# Patient Record
Sex: Female | Born: 1950
Health system: Southern US, Community
[De-identification: ages and names within clinical notes are randomized; demographics above are authoritative.]

## PROBLEM LIST (undated history)

## (undated) ENCOUNTER — Emergency Department (HOSPITAL_COMMUNITY): Admission: EM | Payer: Self-pay | Source: Home / Self Care

## (undated) ENCOUNTER — Ambulatory Visit (HOSPITAL_COMMUNITY): Discharge status: Absent without leave | Payer: Medicare Other

## (undated) DIAGNOSIS — E662 Morbid (severe) obesity with alveolar hypoventilation: Secondary | ICD-10-CM

## (undated) DIAGNOSIS — E7800 Pure hypercholesterolemia, unspecified: Secondary | ICD-10-CM

## (undated) DIAGNOSIS — M419 Scoliosis, unspecified: Secondary | ICD-10-CM

## (undated) DIAGNOSIS — I509 Heart failure, unspecified: Secondary | ICD-10-CM

## (undated) DIAGNOSIS — G4730 Sleep apnea, unspecified: Secondary | ICD-10-CM

## (undated) DIAGNOSIS — M549 Dorsalgia, unspecified: Secondary | ICD-10-CM

## (undated) DIAGNOSIS — G459 Transient cerebral ischemic attack, unspecified: Secondary | ICD-10-CM

## (undated) DIAGNOSIS — D649 Anemia, unspecified: Secondary | ICD-10-CM

## (undated) DIAGNOSIS — J449 Chronic obstructive pulmonary disease, unspecified: Secondary | ICD-10-CM

## (undated) DIAGNOSIS — I219 Acute myocardial infarction, unspecified: Secondary | ICD-10-CM

## (undated) DIAGNOSIS — I10 Essential (primary) hypertension: Secondary | ICD-10-CM

## (undated) DIAGNOSIS — I639 Cerebral infarction, unspecified: Secondary | ICD-10-CM

## (undated) DIAGNOSIS — I2510 Atherosclerotic heart disease of native coronary artery without angina pectoris: Secondary | ICD-10-CM

## (undated) DIAGNOSIS — G8929 Other chronic pain: Secondary | ICD-10-CM

## (undated) DIAGNOSIS — E669 Obesity, unspecified: Secondary | ICD-10-CM

## (undated) DIAGNOSIS — M1990 Unspecified osteoarthritis, unspecified site: Secondary | ICD-10-CM

## (undated) DIAGNOSIS — N289 Disorder of kidney and ureter, unspecified: Secondary | ICD-10-CM

## (undated) HISTORY — PX: CORONARY ANGIOPLASTY WITH STENT PLACEMENT: SHX49

## (undated) HISTORY — PX: COLON SURGERY: SHX602

## (undated) HISTORY — PX: OTHER SURGICAL HISTORY: SHX169

---

## 1997-06-04 ENCOUNTER — Inpatient Hospital Stay (HOSPITAL_COMMUNITY): Admission: EM | Admit: 1997-06-04 | Discharge: 1997-06-08 | Payer: Self-pay | Admitting: Emergency Medicine

## 1997-07-15 ENCOUNTER — Emergency Department (HOSPITAL_COMMUNITY): Admission: EM | Admit: 1997-07-15 | Discharge: 1997-07-15 | Payer: Self-pay | Admitting: Emergency Medicine

## 1997-07-17 ENCOUNTER — Other Ambulatory Visit: Admission: RE | Admit: 1997-07-17 | Discharge: 1997-07-17 | Payer: Self-pay | Admitting: Hematology and Oncology

## 1997-07-20 ENCOUNTER — Ambulatory Visit (HOSPITAL_COMMUNITY): Admission: RE | Admit: 1997-07-20 | Discharge: 1997-07-20 | Payer: Self-pay | Admitting: Hematology and Oncology

## 1997-07-26 ENCOUNTER — Emergency Department (HOSPITAL_COMMUNITY): Admission: EM | Admit: 1997-07-26 | Discharge: 1997-07-26 | Payer: Self-pay | Admitting: Emergency Medicine

## 1997-07-26 ENCOUNTER — Ambulatory Visit (HOSPITAL_COMMUNITY): Admission: RE | Admit: 1997-07-26 | Discharge: 1997-07-26 | Payer: Self-pay | Admitting: General Surgery

## 1997-07-31 ENCOUNTER — Inpatient Hospital Stay (HOSPITAL_COMMUNITY): Admission: EM | Admit: 1997-07-31 | Discharge: 1997-08-11 | Payer: Self-pay | Admitting: Emergency Medicine

## 1999-06-13 ENCOUNTER — Ambulatory Visit (HOSPITAL_COMMUNITY): Admission: RE | Admit: 1999-06-13 | Discharge: 1999-06-13 | Payer: Self-pay | Admitting: Oncology

## 1999-06-13 ENCOUNTER — Encounter: Payer: Self-pay | Admitting: Oncology

## 1999-07-17 ENCOUNTER — Encounter: Admission: RE | Admit: 1999-07-17 | Discharge: 1999-07-17 | Payer: Self-pay | Admitting: Oncology

## 1999-07-17 ENCOUNTER — Encounter: Payer: Self-pay | Admitting: Oncology

## 1999-08-07 ENCOUNTER — Ambulatory Visit (HOSPITAL_COMMUNITY): Admission: RE | Admit: 1999-08-07 | Discharge: 1999-08-07 | Payer: Self-pay | Admitting: Gastroenterology

## 1999-09-05 ENCOUNTER — Encounter: Payer: Self-pay | Admitting: Emergency Medicine

## 1999-09-05 ENCOUNTER — Emergency Department (HOSPITAL_COMMUNITY): Admission: EM | Admit: 1999-09-05 | Discharge: 1999-09-05 | Payer: Self-pay | Admitting: Emergency Medicine

## 1999-12-09 ENCOUNTER — Encounter: Payer: Self-pay | Admitting: Oncology

## 1999-12-09 ENCOUNTER — Ambulatory Visit (HOSPITAL_COMMUNITY): Admission: RE | Admit: 1999-12-09 | Discharge: 1999-12-09 | Payer: Self-pay | Admitting: Oncology

## 2000-02-01 ENCOUNTER — Encounter: Payer: Self-pay | Admitting: Emergency Medicine

## 2000-02-01 ENCOUNTER — Emergency Department (HOSPITAL_COMMUNITY): Admission: EM | Admit: 2000-02-01 | Discharge: 2000-02-01 | Payer: Self-pay | Admitting: Emergency Medicine

## 2000-05-27 ENCOUNTER — Encounter: Payer: Self-pay | Admitting: Emergency Medicine

## 2000-05-28 ENCOUNTER — Encounter: Payer: Self-pay | Admitting: Cardiology

## 2000-05-28 ENCOUNTER — Observation Stay (HOSPITAL_COMMUNITY): Admission: EM | Admit: 2000-05-28 | Discharge: 2000-05-28 | Payer: Self-pay | Admitting: Emergency Medicine

## 2000-08-04 ENCOUNTER — Emergency Department (HOSPITAL_COMMUNITY): Admission: EM | Admit: 2000-08-04 | Discharge: 2000-08-04 | Payer: Self-pay | Admitting: Emergency Medicine

## 2000-08-04 ENCOUNTER — Encounter: Payer: Self-pay | Admitting: Internal Medicine

## 2000-08-08 ENCOUNTER — Ambulatory Visit (HOSPITAL_COMMUNITY): Admission: RE | Admit: 2000-08-08 | Discharge: 2000-08-08 | Payer: Self-pay | Admitting: *Deleted

## 2000-09-04 ENCOUNTER — Emergency Department (HOSPITAL_COMMUNITY): Admission: EM | Admit: 2000-09-04 | Discharge: 2000-09-04 | Payer: Self-pay | Admitting: *Deleted

## 2000-12-17 ENCOUNTER — Emergency Department (HOSPITAL_COMMUNITY): Admission: EM | Admit: 2000-12-17 | Discharge: 2000-12-17 | Payer: Self-pay | Admitting: *Deleted

## 2001-02-01 ENCOUNTER — Emergency Department (HOSPITAL_COMMUNITY): Admission: EM | Admit: 2001-02-01 | Discharge: 2001-02-01 | Payer: Self-pay | Admitting: Emergency Medicine

## 2001-09-13 ENCOUNTER — Emergency Department (HOSPITAL_COMMUNITY): Admission: EM | Admit: 2001-09-13 | Discharge: 2001-09-13 | Payer: Self-pay | Admitting: Emergency Medicine

## 2001-09-13 ENCOUNTER — Encounter: Payer: Self-pay | Admitting: Emergency Medicine

## 2002-02-14 ENCOUNTER — Emergency Department (HOSPITAL_COMMUNITY): Admission: EM | Admit: 2002-02-14 | Discharge: 2002-02-14 | Payer: Self-pay | Admitting: Emergency Medicine

## 2002-02-15 ENCOUNTER — Encounter: Payer: Self-pay | Admitting: Cardiology

## 2002-02-15 ENCOUNTER — Inpatient Hospital Stay (HOSPITAL_COMMUNITY): Admission: AD | Admit: 2002-02-15 | Discharge: 2002-02-17 | Payer: Self-pay | Admitting: Cardiology

## 2002-02-15 ENCOUNTER — Encounter: Payer: Self-pay | Admitting: Emergency Medicine

## 2002-02-17 ENCOUNTER — Encounter: Payer: Self-pay | Admitting: Cardiology

## 2002-02-27 ENCOUNTER — Encounter: Payer: Self-pay | Admitting: Cardiology

## 2002-02-27 ENCOUNTER — Ambulatory Visit (HOSPITAL_COMMUNITY): Admission: RE | Admit: 2002-02-27 | Discharge: 2002-02-27 | Payer: Self-pay | Admitting: Cardiology

## 2002-10-24 ENCOUNTER — Ambulatory Visit (HOSPITAL_COMMUNITY): Admission: RE | Admit: 2002-10-24 | Discharge: 2002-10-24 | Payer: Self-pay | Admitting: Pulmonary Disease

## 2002-10-24 ENCOUNTER — Encounter: Payer: Self-pay | Admitting: Pulmonary Disease

## 2002-11-25 ENCOUNTER — Emergency Department (HOSPITAL_COMMUNITY): Admission: EM | Admit: 2002-11-25 | Discharge: 2002-11-25 | Payer: Self-pay | Admitting: Emergency Medicine

## 2003-06-20 ENCOUNTER — Emergency Department (HOSPITAL_COMMUNITY): Admission: EM | Admit: 2003-06-20 | Discharge: 2003-06-20 | Payer: Self-pay | Admitting: Emergency Medicine

## 2003-08-11 ENCOUNTER — Emergency Department (HOSPITAL_COMMUNITY): Admission: EM | Admit: 2003-08-11 | Discharge: 2003-08-12 | Payer: Self-pay | Admitting: *Deleted

## 2003-12-22 ENCOUNTER — Emergency Department (HOSPITAL_COMMUNITY): Admission: EM | Admit: 2003-12-22 | Discharge: 2003-12-22 | Payer: Self-pay | Admitting: Emergency Medicine

## 2004-04-20 ENCOUNTER — Emergency Department (HOSPITAL_COMMUNITY): Admission: EM | Admit: 2004-04-20 | Discharge: 2004-04-20 | Payer: Self-pay | Admitting: Family Medicine

## 2004-06-12 ENCOUNTER — Emergency Department (HOSPITAL_COMMUNITY): Admission: EM | Admit: 2004-06-12 | Discharge: 2004-06-12 | Payer: Self-pay | Admitting: Family Medicine

## 2004-06-28 ENCOUNTER — Emergency Department (HOSPITAL_COMMUNITY): Admission: EM | Admit: 2004-06-28 | Discharge: 2004-06-28 | Payer: Self-pay | Admitting: Emergency Medicine

## 2004-07-03 ENCOUNTER — Emergency Department (HOSPITAL_COMMUNITY): Admission: EM | Admit: 2004-07-03 | Discharge: 2004-07-03 | Payer: Self-pay | Admitting: Emergency Medicine

## 2004-07-11 ENCOUNTER — Encounter: Admission: RE | Admit: 2004-07-11 | Discharge: 2004-07-11 | Payer: Self-pay | Admitting: Orthopedic Surgery

## 2004-08-07 ENCOUNTER — Emergency Department (HOSPITAL_COMMUNITY): Admission: EM | Admit: 2004-08-07 | Discharge: 2004-08-07 | Payer: Self-pay | Admitting: Emergency Medicine

## 2004-08-29 ENCOUNTER — Ambulatory Visit: Payer: Self-pay | Admitting: Oncology

## 2004-09-06 ENCOUNTER — Emergency Department (HOSPITAL_COMMUNITY): Admission: EM | Admit: 2004-09-06 | Discharge: 2004-09-07 | Payer: Self-pay | Admitting: Emergency Medicine

## 2004-09-12 ENCOUNTER — Emergency Department (HOSPITAL_COMMUNITY): Admission: EM | Admit: 2004-09-12 | Discharge: 2004-09-12 | Payer: Self-pay | Admitting: Family Medicine

## 2004-11-16 ENCOUNTER — Emergency Department (HOSPITAL_COMMUNITY): Admission: EM | Admit: 2004-11-16 | Discharge: 2004-11-16 | Payer: Self-pay | Admitting: Family Medicine

## 2004-12-27 ENCOUNTER — Emergency Department (HOSPITAL_COMMUNITY): Admission: EM | Admit: 2004-12-27 | Discharge: 2004-12-27 | Payer: Self-pay | Admitting: Family Medicine

## 2005-02-08 ENCOUNTER — Emergency Department (HOSPITAL_COMMUNITY): Admission: EM | Admit: 2005-02-08 | Discharge: 2005-02-08 | Payer: Self-pay | Admitting: Emergency Medicine

## 2005-02-13 ENCOUNTER — Ambulatory Visit: Payer: Self-pay | Admitting: Oncology

## 2005-02-19 ENCOUNTER — Encounter: Admission: RE | Admit: 2005-02-19 | Discharge: 2005-02-19 | Payer: Self-pay | Admitting: Pulmonary Disease

## 2005-03-14 ENCOUNTER — Emergency Department (HOSPITAL_COMMUNITY): Admission: EM | Admit: 2005-03-14 | Discharge: 2005-03-14 | Payer: Self-pay | Admitting: Emergency Medicine

## 2005-04-03 ENCOUNTER — Emergency Department (HOSPITAL_COMMUNITY): Admission: EM | Admit: 2005-04-03 | Discharge: 2005-04-03 | Payer: Self-pay | Admitting: Emergency Medicine

## 2005-04-16 ENCOUNTER — Ambulatory Visit: Payer: Self-pay | Admitting: Oncology

## 2005-05-25 ENCOUNTER — Emergency Department (HOSPITAL_COMMUNITY): Admission: EM | Admit: 2005-05-25 | Discharge: 2005-05-26 | Payer: Self-pay | Admitting: Emergency Medicine

## 2005-06-05 ENCOUNTER — Emergency Department (HOSPITAL_COMMUNITY): Admission: EM | Admit: 2005-06-05 | Discharge: 2005-06-05 | Payer: Self-pay | Admitting: Emergency Medicine

## 2005-07-09 ENCOUNTER — Ambulatory Visit: Payer: Self-pay | Admitting: Oncology

## 2005-07-13 LAB — CBC WITH DIFFERENTIAL/PLATELET
BASO%: 0.3 % (ref 0.0–2.0)
Basophils Absolute: 0 10*3/uL (ref 0.0–0.1)
EOS%: 2.5 % (ref 0.0–7.0)
Eosinophils Absolute: 0.3 10*3/uL (ref 0.0–0.5)
HCT: 34.8 % (ref 34.8–46.6)
HGB: 11.9 g/dL (ref 11.6–15.9)
LYMPH%: 61.2 % — ABNORMAL HIGH (ref 14.0–48.0)
MCH: 31.2 pg (ref 26.0–34.0)
MCHC: 34.2 g/dL (ref 32.0–36.0)
MCV: 91 fL (ref 81.0–101.0)
MONO#: 0.7 10*3/uL (ref 0.1–0.9)
MONO%: 5.6 % (ref 0.0–13.0)
NEUT#: 3.6 10*3/uL (ref 1.5–6.5)
NEUT%: 30.4 % — ABNORMAL LOW (ref 39.6–76.8)
Platelets: 223 10*3/uL (ref 145–400)
RBC: 3.83 10*6/uL (ref 3.70–5.32)
RDW: 14.9 % — ABNORMAL HIGH (ref 11.3–14.5)
WBC: 11.8 10*3/uL — ABNORMAL HIGH (ref 3.9–10.0)
lymph#: 7.2 10*3/uL — ABNORMAL HIGH (ref 0.9–3.3)

## 2005-07-13 LAB — COMPREHENSIVE METABOLIC PANEL
ALT: 13 U/L (ref 0–40)
AST: 16 U/L (ref 0–37)
Albumin: 4.5 g/dL (ref 3.5–5.2)
Alkaline Phosphatase: 85 U/L (ref 39–117)
BUN: 17 mg/dL (ref 6–23)
CO2: 22 mEq/L (ref 19–32)
Calcium: 9.9 mg/dL (ref 8.4–10.5)
Chloride: 107 mEq/L (ref 96–112)
Creatinine, Ser: 1.14 mg/dL (ref 0.40–1.20)
Glucose, Bld: 149 mg/dL — ABNORMAL HIGH (ref 70–99)
Potassium: 3.7 mEq/L (ref 3.5–5.3)
Sodium: 143 mEq/L (ref 135–145)
Total Bilirubin: 0.5 mg/dL (ref 0.3–1.2)
Total Protein: 7.6 g/dL (ref 6.0–8.3)

## 2005-07-13 LAB — LACTATE DEHYDROGENASE: LDH: 248 U/L (ref 94–250)

## 2005-07-13 LAB — CEA: CEA: 1.5 ng/mL (ref 0.0–5.0)

## 2005-08-16 ENCOUNTER — Emergency Department (HOSPITAL_COMMUNITY): Admission: EM | Admit: 2005-08-16 | Discharge: 2005-08-16 | Payer: Self-pay | Admitting: Family Medicine

## 2005-09-12 ENCOUNTER — Emergency Department (HOSPITAL_COMMUNITY): Admission: EM | Admit: 2005-09-12 | Discharge: 2005-09-12 | Payer: Self-pay | Admitting: Family Medicine

## 2005-11-06 ENCOUNTER — Emergency Department (HOSPITAL_COMMUNITY): Admission: EM | Admit: 2005-11-06 | Discharge: 2005-11-06 | Payer: Self-pay | Admitting: Family Medicine

## 2006-01-21 ENCOUNTER — Encounter: Admission: RE | Admit: 2006-01-21 | Discharge: 2006-04-01 | Payer: Self-pay | Admitting: Pulmonary Disease

## 2006-03-30 ENCOUNTER — Encounter: Admission: RE | Admit: 2006-03-30 | Discharge: 2006-03-30 | Payer: Self-pay | Admitting: Pulmonary Disease

## 2006-07-30 ENCOUNTER — Emergency Department (HOSPITAL_COMMUNITY): Admission: EM | Admit: 2006-07-30 | Discharge: 2006-07-31 | Payer: Self-pay | Admitting: Emergency Medicine

## 2006-09-20 ENCOUNTER — Encounter: Admission: RE | Admit: 2006-09-20 | Discharge: 2006-09-20 | Payer: Self-pay | Admitting: Cardiology

## 2006-10-14 ENCOUNTER — Emergency Department (HOSPITAL_COMMUNITY): Admission: EM | Admit: 2006-10-14 | Discharge: 2006-10-14 | Payer: Self-pay | Admitting: Emergency Medicine

## 2007-01-30 ENCOUNTER — Emergency Department (HOSPITAL_COMMUNITY): Admission: EM | Admit: 2007-01-30 | Discharge: 2007-01-30 | Payer: Self-pay | Admitting: *Deleted

## 2007-06-24 ENCOUNTER — Emergency Department (HOSPITAL_COMMUNITY): Admission: EM | Admit: 2007-06-24 | Discharge: 2007-06-25 | Payer: Self-pay | Admitting: Emergency Medicine

## 2007-08-03 ENCOUNTER — Emergency Department (HOSPITAL_COMMUNITY): Admission: EM | Admit: 2007-08-03 | Discharge: 2007-08-03 | Payer: Self-pay | Admitting: Emergency Medicine

## 2007-08-12 ENCOUNTER — Emergency Department (HOSPITAL_COMMUNITY): Admission: EM | Admit: 2007-08-12 | Discharge: 2007-08-12 | Payer: Self-pay | Admitting: Emergency Medicine

## 2007-09-28 ENCOUNTER — Inpatient Hospital Stay (HOSPITAL_COMMUNITY): Admission: EM | Admit: 2007-09-28 | Discharge: 2007-09-30 | Payer: Self-pay | Admitting: Emergency Medicine

## 2008-03-12 ENCOUNTER — Encounter: Admission: RE | Admit: 2008-03-12 | Discharge: 2008-06-10 | Payer: Self-pay | Admitting: Orthopedic Surgery

## 2008-06-20 ENCOUNTER — Emergency Department (HOSPITAL_COMMUNITY): Admission: EM | Admit: 2008-06-20 | Discharge: 2008-06-20 | Payer: Self-pay | Admitting: Emergency Medicine

## 2008-07-10 ENCOUNTER — Emergency Department (HOSPITAL_COMMUNITY): Admission: EM | Admit: 2008-07-10 | Discharge: 2008-07-10 | Payer: Self-pay | Admitting: Emergency Medicine

## 2008-08-23 ENCOUNTER — Emergency Department (HOSPITAL_COMMUNITY): Admission: EM | Admit: 2008-08-23 | Discharge: 2008-08-23 | Payer: Self-pay | Admitting: Emergency Medicine

## 2008-08-30 ENCOUNTER — Emergency Department (HOSPITAL_COMMUNITY): Admission: EM | Admit: 2008-08-30 | Discharge: 2008-08-30 | Payer: Self-pay | Admitting: Emergency Medicine

## 2008-11-06 ENCOUNTER — Emergency Department (HOSPITAL_COMMUNITY): Admission: EM | Admit: 2008-11-06 | Discharge: 2008-11-06 | Payer: Self-pay | Admitting: Emergency Medicine

## 2008-12-12 ENCOUNTER — Inpatient Hospital Stay (HOSPITAL_COMMUNITY): Admission: AD | Admit: 2008-12-12 | Discharge: 2008-12-14 | Payer: Self-pay | Admitting: Cardiology

## 2008-12-12 ENCOUNTER — Encounter: Payer: Self-pay | Admitting: Emergency Medicine

## 2009-05-20 ENCOUNTER — Emergency Department (HOSPITAL_COMMUNITY): Admission: EM | Admit: 2009-05-20 | Discharge: 2009-05-20 | Payer: Self-pay | Admitting: Emergency Medicine

## 2009-07-20 ENCOUNTER — Emergency Department (HOSPITAL_COMMUNITY): Admission: EM | Admit: 2009-07-20 | Discharge: 2009-07-21 | Payer: Self-pay | Admitting: Emergency Medicine

## 2009-07-23 ENCOUNTER — Emergency Department (HOSPITAL_COMMUNITY): Admission: EM | Admit: 2009-07-23 | Discharge: 2009-07-23 | Payer: Self-pay | Admitting: Family Medicine

## 2009-08-14 ENCOUNTER — Emergency Department (HOSPITAL_COMMUNITY): Admission: EM | Admit: 2009-08-14 | Discharge: 2009-08-14 | Payer: Self-pay | Admitting: Emergency Medicine

## 2009-10-13 ENCOUNTER — Inpatient Hospital Stay (HOSPITAL_COMMUNITY): Admission: EM | Admit: 2009-10-13 | Discharge: 2009-10-15 | Payer: Self-pay | Admitting: Emergency Medicine

## 2009-10-14 ENCOUNTER — Encounter (INDEPENDENT_AMBULATORY_CARE_PROVIDER_SITE_OTHER): Payer: Self-pay | Admitting: Family Medicine

## 2010-01-29 ENCOUNTER — Emergency Department (HOSPITAL_COMMUNITY)
Admission: EM | Admit: 2010-01-29 | Discharge: 2010-01-29 | Payer: Self-pay | Source: Home / Self Care | Admitting: Family Medicine

## 2010-02-15 ENCOUNTER — Encounter: Payer: Self-pay | Admitting: Cardiology

## 2010-02-16 ENCOUNTER — Encounter: Payer: Self-pay | Admitting: Oncology

## 2010-02-16 ENCOUNTER — Encounter: Payer: Self-pay | Admitting: Pulmonary Disease

## 2010-02-16 ENCOUNTER — Encounter: Payer: Self-pay | Admitting: Cardiology

## 2010-02-16 ENCOUNTER — Encounter: Payer: Self-pay | Admitting: Neurosurgery

## 2010-04-10 LAB — GLUCOSE, CAPILLARY
Glucose-Capillary: 212 mg/dL — ABNORMAL HIGH (ref 70–99)
Glucose-Capillary: 227 mg/dL — ABNORMAL HIGH (ref 70–99)
Glucose-Capillary: 238 mg/dL — ABNORMAL HIGH (ref 70–99)
Glucose-Capillary: 281 mg/dL — ABNORMAL HIGH (ref 70–99)
Glucose-Capillary: 291 mg/dL — ABNORMAL HIGH (ref 70–99)
Glucose-Capillary: 292 mg/dL — ABNORMAL HIGH (ref 70–99)
Glucose-Capillary: 311 mg/dL — ABNORMAL HIGH (ref 70–99)
Glucose-Capillary: 333 mg/dL — ABNORMAL HIGH (ref 70–99)
Glucose-Capillary: 352 mg/dL — ABNORMAL HIGH (ref 70–99)
Glucose-Capillary: 352 mg/dL — ABNORMAL HIGH (ref 70–99)
Glucose-Capillary: 366 mg/dL — ABNORMAL HIGH (ref 70–99)

## 2010-04-10 LAB — URINALYSIS, ROUTINE W REFLEX MICROSCOPIC
Bilirubin Urine: NEGATIVE
Glucose, UA: >1000 mg/dL — AB
Hgb urine dipstick: NEGATIVE
Ketones, ur: NEGATIVE mg/dL
Leukocytes, UA: NEGATIVE
Nitrite: NEGATIVE
Protein, ur: 30 mg/dL — AB
Specific Gravity, Urine: 1.037 — ABNORMAL HIGH (ref 1.005–1.030)
Urobilinogen, UA: 0.2 mg/dL (ref 0.0–1.0)
pH: 5.5 (ref 5.0–8.0)

## 2010-04-10 LAB — DIFFERENTIAL
Basophils Absolute: 0 10*3/uL (ref 0.0–0.1)
Basophils Relative: 0 % (ref 0–1)
Eosinophils Absolute: 0.2 10*3/uL (ref 0.0–0.7)
Eosinophils Relative: 3 % (ref 0–5)
Lymphocytes Relative: 37 % (ref 12–46)
Lymphs Abs: 2.7 10*3/uL (ref 0.7–4.0)
Monocytes Absolute: 0.4 10*3/uL (ref 0.1–1.0)
Monocytes Relative: 6 % (ref 3–12)
Neutro Abs: 3.9 10*3/uL (ref 1.7–7.7)
Neutrophils Relative %: 54 % (ref 43–77)

## 2010-04-10 LAB — CK TOTAL AND CKMB (NOT AT ARMC)
CK, MB: 4.5 ng/mL — ABNORMAL HIGH (ref 0.3–4.0)
Relative Index: 3.1 — ABNORMAL HIGH (ref 0.0–2.5)
Total CK: 146 U/L (ref 7–177)

## 2010-04-10 LAB — CBC
HCT: 34.8 % — ABNORMAL LOW (ref 36.0–46.0)
Hemoglobin: 12.4 g/dL (ref 12.0–15.0)
MCH: 32.1 pg (ref 26.0–34.0)
MCHC: 35.6 g/dL (ref 30.0–36.0)
MCV: 90.2 fL (ref 78.0–100.0)
Platelets: 164 10*3/uL (ref 150–400)
RBC: 3.86 MIL/uL — ABNORMAL LOW (ref 3.87–5.11)
RDW: 13 % (ref 11.5–15.5)
WBC: 7.2 10*3/uL (ref 4.0–10.5)

## 2010-04-10 LAB — COMPREHENSIVE METABOLIC PANEL
ALT: 20 U/L (ref 0–35)
AST: 27 U/L (ref 0–37)
Albumin: 3.6 g/dL (ref 3.5–5.2)
Alkaline Phosphatase: 94 U/L (ref 39–117)
BUN: 13 mg/dL (ref 6–23)
CO2: 24 mEq/L (ref 19–32)
Calcium: 9.2 mg/dL (ref 8.4–10.5)
Chloride: 107 mEq/L (ref 96–112)
Creatinine, Ser: 0.98 mg/dL (ref 0.4–1.2)
GFR calc Af Amer: >60 mL/min (ref >60)
GFR calc non Af Amer: 58 mL/min — ABNORMAL LOW (ref >60)
Glucose, Bld: 386 mg/dL — ABNORMAL HIGH (ref 70–99)
Potassium: 3.5 mEq/L (ref 3.5–5.1)
Sodium: 139 mEq/L (ref 135–145)
Total Bilirubin: 0.4 mg/dL (ref 0.3–1.2)
Total Protein: 6.8 g/dL (ref 6.0–8.3)

## 2010-04-10 LAB — HEMOGLOBIN A1C
Hgb A1c MFr Bld: 12 % — ABNORMAL HIGH (ref <5.7)
Mean Plasma Glucose: 298 mg/dL — ABNORMAL HIGH (ref <117)

## 2010-04-10 LAB — LIPID PANEL
Cholesterol: 145 mg/dL (ref 0–200)
HDL: 33 mg/dL — ABNORMAL LOW (ref >39)
LDL Cholesterol: UNDETERMINED mg/dL (ref 0–99)
Total CHOL/HDL Ratio: 4.4 RATIO
Triglycerides: 503 mg/dL — ABNORMAL HIGH (ref <150)
VLDL: UNDETERMINED mg/dL (ref 0–40)

## 2010-04-10 LAB — URINE CULTURE
Colony Count: 10000
Culture  Setup Time: 201109182058

## 2010-04-10 LAB — CARDIAC PANEL(CRET KIN+CKTOT+MB+TROPI)
CK, MB: 3.3 ng/mL (ref 0.3–4.0)
Relative Index: 2.6 — ABNORMAL HIGH (ref 0.0–2.5)
Total CK: 127 U/L (ref 7–177)
Troponin I: 0.02 ng/mL (ref 0.00–0.06)

## 2010-04-10 LAB — POCT CARDIAC MARKERS
CKMB, poc: 2.5 ng/mL (ref 1.0–8.0)
Myoglobin, poc: 90.8 ng/mL (ref 12–200)
Troponin i, poc: <0.05 ng/mL (ref 0.00–0.09)

## 2010-04-10 LAB — BRAIN NATRIURETIC PEPTIDE: Pro B Natriuretic peptide (BNP): 44 pg/mL (ref 0.0–100.0)

## 2010-04-10 LAB — D-DIMER, QUANTITATIVE: D-Dimer, Quant: 0.46 ug/mL-FEU (ref 0.00–0.48)

## 2010-04-10 LAB — URINE MICROSCOPIC-ADD ON

## 2010-04-10 LAB — TROPONIN I: Troponin I: 0.01 ng/mL (ref 0.00–0.06)

## 2010-04-13 LAB — POCT CARDIAC MARKERS
CKMB, poc: 4.2 ng/mL (ref 1.0–8.0)
CKMB, poc: 6.1 ng/mL (ref 1.0–8.0)
Myoglobin, poc: 134 ng/mL (ref 12–200)
Myoglobin, poc: 152 ng/mL (ref 12–200)
Troponin i, poc: <0.05 ng/mL (ref 0.00–0.09)
Troponin i, poc: <0.05 ng/mL (ref 0.00–0.09)

## 2010-04-13 LAB — DIFFERENTIAL
Basophils Absolute: 0.1 10*3/uL (ref 0.0–0.1)
Basophils Relative: 1 % (ref 0–1)
Eosinophils Absolute: 0.2 10*3/uL (ref 0.0–0.7)
Eosinophils Relative: 3 % (ref 0–5)
Lymphocytes Relative: 40 % (ref 12–46)
Lymphs Abs: 3.4 10*3/uL (ref 0.7–4.0)
Monocytes Absolute: 0.6 10*3/uL (ref 0.1–1.0)
Monocytes Relative: 7 % (ref 3–12)
Neutro Abs: 4.3 10*3/uL (ref 1.7–7.7)
Neutrophils Relative %: 50 % (ref 43–77)

## 2010-04-13 LAB — COMPREHENSIVE METABOLIC PANEL
ALT: 21 U/L (ref 0–35)
AST: 23 U/L (ref 0–37)
Albumin: 3.6 g/dL (ref 3.5–5.2)
Alkaline Phosphatase: 87 U/L (ref 39–117)
BUN: 21 mg/dL (ref 6–23)
CO2: 22 mEq/L (ref 19–32)
Calcium: 9.3 mg/dL (ref 8.4–10.5)
Chloride: 109 mEq/L (ref 96–112)
Creatinine, Ser: 1.43 mg/dL — ABNORMAL HIGH (ref 0.4–1.2)
GFR calc Af Amer: 46 mL/min — ABNORMAL LOW (ref >60)
GFR calc non Af Amer: 38 mL/min — ABNORMAL LOW (ref >60)
Glucose, Bld: 196 mg/dL — ABNORMAL HIGH (ref 70–99)
Potassium: 3.7 mEq/L (ref 3.5–5.1)
Sodium: 141 mEq/L (ref 135–145)
Total Bilirubin: 0.4 mg/dL (ref 0.3–1.2)
Total Protein: 6.8 g/dL (ref 6.0–8.3)

## 2010-04-13 LAB — CBC
HCT: 34 % — ABNORMAL LOW (ref 36.0–46.0)
Hemoglobin: 11.8 g/dL — ABNORMAL LOW (ref 12.0–15.0)
MCH: 32.9 pg (ref 26.0–34.0)
MCHC: 34.6 g/dL (ref 30.0–36.0)
MCV: 94.9 fL (ref 78.0–100.0)
Platelets: 171 K/uL (ref 150–400)
RBC: 3.58 MIL/uL — ABNORMAL LOW (ref 3.87–5.11)
RDW: 15.5 % (ref 11.5–15.5)
WBC: 8.6 K/uL (ref 4.0–10.5)

## 2010-04-13 LAB — LIPASE, BLOOD: Lipase: 45 U/L (ref 11–59)

## 2010-04-15 LAB — DIFFERENTIAL
Basophils Absolute: 0.1 10*3/uL (ref 0.0–0.1)
Basophils Relative: 0 % (ref 0–1)
Eosinophils Absolute: 0.2 10*3/uL (ref 0.0–0.7)
Eosinophils Relative: 1 % (ref 0–5)
Lymphocytes Relative: 34 % (ref 12–46)
Lymphs Abs: 5 10*3/uL — ABNORMAL HIGH (ref 0.7–4.0)
Monocytes Absolute: 1.2 10*3/uL — ABNORMAL HIGH (ref 0.1–1.0)
Monocytes Relative: 8 % (ref 3–12)
Neutro Abs: 8.4 10*3/uL — ABNORMAL HIGH (ref 1.7–7.7)
Neutrophils Relative %: 57 % (ref 43–77)

## 2010-04-15 LAB — CBC
HCT: 34.5 % — ABNORMAL LOW (ref 36.0–46.0)
Hemoglobin: 11.4 g/dL — ABNORMAL LOW (ref 12.0–15.0)
MCHC: 33.2 g/dL (ref 30.0–36.0)
MCV: 96.3 fL (ref 78.0–100.0)
Platelets: 201 10*3/uL (ref 150–400)
RBC: 3.58 MIL/uL — ABNORMAL LOW (ref 3.87–5.11)
RDW: 14.5 % (ref 11.5–15.5)
WBC: 14.7 10*3/uL — ABNORMAL HIGH (ref 4.0–10.5)

## 2010-04-15 LAB — URINALYSIS, ROUTINE W REFLEX MICROSCOPIC
Bilirubin Urine: NEGATIVE
Glucose, UA: NEGATIVE mg/dL
Hgb urine dipstick: NEGATIVE
Ketones, ur: NEGATIVE mg/dL
Leukocytes, UA: NEGATIVE
Nitrite: NEGATIVE
Protein, ur: 30 mg/dL — AB
Specific Gravity, Urine: 1.021 (ref 1.005–1.030)
Urobilinogen, UA: 0.2 mg/dL (ref 0.0–1.0)
pH: 5.5 (ref 5.0–8.0)

## 2010-04-15 LAB — COMPREHENSIVE METABOLIC PANEL
ALT: 15 U/L (ref 0–35)
AST: 21 U/L (ref 0–37)
Albumin: 3.9 g/dL (ref 3.5–5.2)
Alkaline Phosphatase: 72 U/L (ref 39–117)
BUN: 12 mg/dL (ref 6–23)
CO2: 26 mEq/L (ref 19–32)
Calcium: 9.1 mg/dL (ref 8.4–10.5)
Chloride: 109 mEq/L (ref 96–112)
Creatinine, Ser: 1.14 mg/dL (ref 0.4–1.2)
GFR calc Af Amer: 59 mL/min — ABNORMAL LOW (ref >60)
GFR calc non Af Amer: 49 mL/min — ABNORMAL LOW (ref >60)
Glucose, Bld: 139 mg/dL — ABNORMAL HIGH (ref 70–99)
Potassium: 3.4 mEq/L — ABNORMAL LOW (ref 3.5–5.1)
Sodium: 143 mEq/L (ref 135–145)
Total Bilirubin: 0.5 mg/dL (ref 0.3–1.2)
Total Protein: 7.9 g/dL (ref 6.0–8.3)

## 2010-04-15 LAB — URINE MICROSCOPIC-ADD ON

## 2010-04-30 LAB — URINALYSIS, ROUTINE W REFLEX MICROSCOPIC
Bilirubin Urine: NEGATIVE
Glucose, UA: >1000 mg/dL — AB
Hgb urine dipstick: NEGATIVE
Ketones, ur: NEGATIVE mg/dL
Leukocytes, UA: NEGATIVE
Nitrite: NEGATIVE
Protein, ur: NEGATIVE mg/dL
Specific Gravity, Urine: 1.039 — ABNORMAL HIGH (ref 1.005–1.030)
Urobilinogen, UA: 0.2 mg/dL (ref 0.0–1.0)
pH: 5.5 (ref 5.0–8.0)

## 2010-04-30 LAB — BASIC METABOLIC PANEL
BUN: 27 mg/dL — ABNORMAL HIGH (ref 6–23)
BUN: 29 mg/dL — ABNORMAL HIGH (ref 6–23)
BUN: 31 mg/dL — ABNORMAL HIGH (ref 6–23)
BUN: 14 mg/dL (ref 6–23)
CO2: 21 mEq/L (ref 19–32)
CO2: 21 mEq/L (ref 19–32)
CO2: 24 mEq/L (ref 19–32)
CO2: 25 mEq/L (ref 19–32)
Calcium: 8.9 mg/dL (ref 8.4–10.5)
Calcium: 9.1 mg/dL (ref 8.4–10.5)
Calcium: 9.3 mg/dL (ref 8.4–10.5)
Calcium: 9.7 mg/dL (ref 8.4–10.5)
Chloride: 103 mEq/L (ref 96–112)
Chloride: 106 mEq/L (ref 96–112)
Chloride: 107 mEq/L (ref 96–112)
Chloride: 96 mEq/L (ref 96–112)
Creatinine, Ser: 1.66 mg/dL — ABNORMAL HIGH (ref 0.4–1.2)
Creatinine, Ser: 2.05 mg/dL — ABNORMAL HIGH (ref 0.4–1.2)
Creatinine, Ser: 2.33 mg/dL — ABNORMAL HIGH (ref 0.4–1.2)
Creatinine, Ser: 1.38 mg/dL — ABNORMAL HIGH (ref 0.4–1.2)
GFR calc Af Amer: 26 mL/min — ABNORMAL LOW (ref >60)
GFR calc Af Amer: 30 mL/min — ABNORMAL LOW (ref >60)
GFR calc Af Amer: 39 mL/min — ABNORMAL LOW (ref >60)
GFR calc Af Amer: 48 mL/min — ABNORMAL LOW (ref >60)
GFR calc non Af Amer: 22 mL/min — ABNORMAL LOW (ref >60)
GFR calc non Af Amer: 25 mL/min — ABNORMAL LOW (ref >60)
GFR calc non Af Amer: 32 mL/min — ABNORMAL LOW (ref >60)
GFR calc non Af Amer: 39 mL/min — ABNORMAL LOW (ref >60)
Glucose, Bld: 272 mg/dL — ABNORMAL HIGH (ref 70–99)
Glucose, Bld: 369 mg/dL — ABNORMAL HIGH (ref 70–99)
Glucose, Bld: 400 mg/dL — ABNORMAL HIGH (ref 70–99)
Glucose, Bld: 447 mg/dL — ABNORMAL HIGH (ref 70–99)
Potassium: 3.5 mEq/L (ref 3.5–5.1)
Potassium: 3.6 mEq/L (ref 3.5–5.1)
Potassium: 4.1 mEq/L (ref 3.5–5.1)
Potassium: 3.8 mEq/L (ref 3.5–5.1)
Sodium: 137 mEq/L (ref 135–145)
Sodium: 138 mEq/L (ref 135–145)
Sodium: 140 mEq/L (ref 135–145)
Sodium: 135 mEq/L (ref 135–145)

## 2010-04-30 LAB — CARDIAC PANEL(CRET KIN+CKTOT+MB+TROPI)
CK, MB: 5.7 ng/mL — ABNORMAL HIGH (ref 0.3–4.0)
CK, MB: 5.7 ng/mL — ABNORMAL HIGH (ref 0.3–4.0)
Relative Index: 2.5 (ref 0.0–2.5)
Relative Index: 2.8 — ABNORMAL HIGH (ref 0.0–2.5)
Total CK: 201 U/L — ABNORMAL HIGH (ref 7–177)
Total CK: 227 U/L — ABNORMAL HIGH (ref 7–177)
Troponin I: 0.02 ng/mL (ref 0.00–0.06)
Troponin I: 0.03 ng/mL (ref 0.00–0.06)

## 2010-04-30 LAB — CBC
HCT: 34.8 % — ABNORMAL LOW (ref 36.0–46.0)
HCT: 36.6 % (ref 36.0–46.0)
HCT: 40.3 % (ref 36.0–46.0)
Hemoglobin: 12.1 g/dL (ref 12.0–15.0)
Hemoglobin: 12.6 g/dL (ref 12.0–15.0)
Hemoglobin: 13.6 g/dL (ref 12.0–15.0)
MCHC: 34.3 g/dL (ref 30.0–36.0)
MCHC: 34.6 g/dL (ref 30.0–36.0)
MCHC: 33.8 g/dL (ref 30.0–36.0)
MCV: 94.4 fL (ref 78.0–100.0)
MCV: 94.9 fL (ref 78.0–100.0)
MCV: 94.1 fL (ref 78.0–100.0)
Platelets: 138 10*3/uL — ABNORMAL LOW (ref 150–400)
Platelets: 157 10*3/uL (ref 150–400)
Platelets: 180 10*3/uL (ref 150–400)
RBC: 3.69 MIL/uL — ABNORMAL LOW (ref 3.87–5.11)
RBC: 3.86 MIL/uL — ABNORMAL LOW (ref 3.87–5.11)
RBC: 4.28 MIL/uL (ref 3.87–5.11)
RDW: 14.1 % (ref 11.5–15.5)
RDW: 14.4 % (ref 11.5–15.5)
RDW: 14.2 % (ref 11.5–15.5)
WBC: 10.4 10*3/uL (ref 4.0–10.5)
WBC: 7.3 10*3/uL (ref 4.0–10.5)
WBC: 10.7 10*3/uL — ABNORMAL HIGH (ref 4.0–10.5)

## 2010-04-30 LAB — HEPATIC FUNCTION PANEL
ALT: 19 U/L (ref 0–35)
AST: 31 U/L (ref 0–37)
Albumin: 3.7 g/dL (ref 3.5–5.2)
Alkaline Phosphatase: 104 U/L (ref 39–117)
Bilirubin, Direct: 0.1 mg/dL (ref 0.0–0.3)
Indirect Bilirubin: 0.2 mg/dL — ABNORMAL LOW (ref 0.3–0.9)
Total Bilirubin: 0.3 mg/dL (ref 0.3–1.2)
Total Protein: 7.2 g/dL (ref 6.0–8.3)

## 2010-04-30 LAB — GLUCOSE, CAPILLARY
Glucose-Capillary: 231 mg/dL — ABNORMAL HIGH (ref 70–99)
Glucose-Capillary: 239 mg/dL — ABNORMAL HIGH (ref 70–99)
Glucose-Capillary: 261 mg/dL — ABNORMAL HIGH (ref 70–99)
Glucose-Capillary: 313 mg/dL — ABNORMAL HIGH (ref 70–99)
Glucose-Capillary: 322 mg/dL — ABNORMAL HIGH (ref 70–99)
Glucose-Capillary: 343 mg/dL — ABNORMAL HIGH (ref 70–99)
Glucose-Capillary: 344 mg/dL — ABNORMAL HIGH (ref 70–99)
Glucose-Capillary: 347 mg/dL — ABNORMAL HIGH (ref 70–99)
Glucose-Capillary: 355 mg/dL — ABNORMAL HIGH (ref 70–99)
Glucose-Capillary: 380 mg/dL — ABNORMAL HIGH (ref 70–99)
Glucose-Capillary: 382 mg/dL — ABNORMAL HIGH (ref 70–99)
Glucose-Capillary: 429 mg/dL — ABNORMAL HIGH (ref 70–99)
Glucose-Capillary: 466 mg/dL — ABNORMAL HIGH (ref 70–99)

## 2010-04-30 LAB — TROPONIN I: Troponin I: 0.01 ng/mL (ref 0.00–0.06)

## 2010-04-30 LAB — POCT I-STAT, CHEM 8
BUN: 20 mg/dL (ref 6–23)
Calcium, Ion: 1.24 mmol/L (ref 1.12–1.32)
Chloride: 100 mEq/L (ref 96–112)
Creatinine, Ser: 1.3 mg/dL — ABNORMAL HIGH (ref 0.4–1.2)
Glucose, Bld: 458 mg/dL — ABNORMAL HIGH (ref 70–99)
HCT: 43 % (ref 36.0–46.0)
Hemoglobin: 14.6 g/dL (ref 12.0–15.0)
Potassium: 4.1 mEq/L (ref 3.5–5.1)
Sodium: 135 mEq/L (ref 135–145)
TCO2: 25 mmol/L (ref 0–100)

## 2010-04-30 LAB — POCT CARDIAC MARKERS
CKMB, poc: 3.7 ng/mL (ref 1.0–8.0)
Myoglobin, poc: 218 ng/mL (ref 12–200)
Troponin i, poc: <0.05 ng/mL (ref 0.00–0.09)

## 2010-04-30 LAB — APTT
aPTT: 30 seconds (ref 24–37)
aPTT: >200 seconds (ref 24–37)

## 2010-04-30 LAB — URINE MICROSCOPIC-ADD ON

## 2010-04-30 LAB — PROTIME-INR
INR: 1.09 (ref 0.00–1.49)
INR: >10 (ref 0.00–1.49)
Prothrombin Time: 14 seconds (ref 11.6–15.2)
Prothrombin Time: >90 seconds — ABNORMAL HIGH (ref 11.6–15.2)

## 2010-04-30 LAB — HEMOGLOBIN A1C
Hgb A1c MFr Bld: 11.1 % — ABNORMAL HIGH (ref 4.6–6.1)
Mean Plasma Glucose: 272 mg/dL

## 2010-04-30 LAB — CK TOTAL AND CKMB (NOT AT ARMC)
CK, MB: 4.8 ng/mL — ABNORMAL HIGH (ref 0.3–4.0)
Relative Index: 2.3 (ref 0.0–2.5)
Total CK: 212 U/L — ABNORMAL HIGH (ref 7–177)

## 2010-04-30 LAB — DIFFERENTIAL
Basophils Absolute: 0.1 10*3/uL (ref 0.0–0.1)
Basophils Relative: 1 % (ref 0–1)
Eosinophils Absolute: 0.2 10*3/uL (ref 0.0–0.7)
Eosinophils Relative: 2 % (ref 0–5)
Lymphocytes Relative: 50 % — ABNORMAL HIGH (ref 12–46)
Lymphs Abs: 5.3 10*3/uL — ABNORMAL HIGH (ref 0.7–4.0)
Monocytes Absolute: 0.7 10*3/uL (ref 0.1–1.0)
Monocytes Relative: 7 % (ref 3–12)
Neutro Abs: 4.4 10*3/uL (ref 1.7–7.7)
Neutrophils Relative %: 41 % — ABNORMAL LOW (ref 43–77)

## 2010-04-30 LAB — URINE CULTURE: Colony Count: 30000

## 2010-04-30 LAB — FIBRINOGEN: Fibrinogen: 402 mg/dL (ref 204–475)

## 2010-04-30 LAB — KETONES, QUALITATIVE: Acetone, Bld: NEGATIVE

## 2010-05-01 LAB — DIFFERENTIAL
Basophils Absolute: 0 K/uL (ref 0.0–0.1)
Basophils Relative: 1 % (ref 0–1)
Eosinophils Absolute: 0.4 K/uL (ref 0.0–0.7)
Eosinophils Relative: 4 % (ref 0–5)
Lymphocytes Relative: 48 % — ABNORMAL HIGH (ref 12–46)
Lymphs Abs: 4.5 K/uL — ABNORMAL HIGH (ref 0.7–4.0)
Monocytes Absolute: 0.6 K/uL (ref 0.1–1.0)
Monocytes Relative: 7 % (ref 3–12)
Neutro Abs: 3.8 K/uL (ref 1.7–7.7)
Neutrophils Relative %: 41 % — ABNORMAL LOW (ref 43–77)

## 2010-05-01 LAB — CBC
HCT: 34.3 % — ABNORMAL LOW (ref 36.0–46.0)
Hemoglobin: 11.8 g/dL — ABNORMAL LOW (ref 12.0–15.0)
MCHC: 34.4 g/dL (ref 30.0–36.0)
MCV: 94.3 fL (ref 78.0–100.0)
Platelets: 164 K/uL (ref 150–400)
RBC: 3.64 MIL/uL — ABNORMAL LOW (ref 3.87–5.11)
RDW: 14.1 % (ref 11.5–15.5)
WBC: 9.3 K/uL (ref 4.0–10.5)

## 2010-05-01 LAB — POCT CARDIAC MARKERS
CKMB, poc: 3.4 ng/mL (ref 1.0–8.0)
Myoglobin, poc: 102 ng/mL (ref 12–200)
Troponin i, poc: <0.05 ng/mL (ref 0.00–0.09)

## 2010-05-01 LAB — BASIC METABOLIC PANEL
BUN: 9 mg/dL (ref 6–23)
CO2: 24 mEq/L (ref 19–32)
Calcium: 8.9 mg/dL (ref 8.4–10.5)
Chloride: 104 mEq/L (ref 96–112)
Creatinine, Ser: 0.76 mg/dL (ref 0.4–1.2)
GFR calc Af Amer: >60 mL/min (ref >60)
GFR calc non Af Amer: >60 mL/min (ref >60)
Glucose, Bld: 312 mg/dL — ABNORMAL HIGH (ref 70–99)
Potassium: 3.4 mEq/L — ABNORMAL LOW (ref 3.5–5.1)
Sodium: 137 mEq/L (ref 135–145)

## 2010-05-01 LAB — BRAIN NATRIURETIC PEPTIDE: Pro B Natriuretic peptide (BNP): 31 pg/mL (ref 0.0–100.0)

## 2010-05-03 LAB — URINALYSIS, ROUTINE W REFLEX MICROSCOPIC
Bilirubin Urine: NEGATIVE
Glucose, UA: NEGATIVE mg/dL
Hgb urine dipstick: NEGATIVE
Ketones, ur: NEGATIVE mg/dL
Nitrite: NEGATIVE
Protein, ur: 30 mg/dL — AB
Specific Gravity, Urine: 1.023 (ref 1.005–1.030)
Urobilinogen, UA: 0.2 mg/dL (ref 0.0–1.0)
pH: 5.5 (ref 5.0–8.0)

## 2010-05-03 LAB — URINE MICROSCOPIC-ADD ON

## 2010-05-03 LAB — URINE CULTURE: Colony Count: 4000

## 2010-05-03 LAB — POCT I-STAT, CHEM 8
BUN: 14 mg/dL (ref 6–23)
Calcium, Ion: 1.3 mmol/L (ref 1.12–1.32)
Chloride: 104 mEq/L (ref 96–112)
Creatinine, Ser: 0.8 mg/dL (ref 0.4–1.2)
Glucose, Bld: 214 mg/dL — ABNORMAL HIGH (ref 70–99)
HCT: 34 % — ABNORMAL LOW (ref 36.0–46.0)
Hemoglobin: 11.6 g/dL — ABNORMAL LOW (ref 12.0–15.0)
Potassium: 3.5 mEq/L (ref 3.5–5.1)
Sodium: 142 mEq/L (ref 135–145)
TCO2: 24 mmol/L (ref 0–100)

## 2010-05-04 LAB — BASIC METABOLIC PANEL
BUN: 8 mg/dL (ref 6–23)
CO2: 24 mEq/L (ref 19–32)
Calcium: 9.3 mg/dL (ref 8.4–10.5)
Chloride: 106 mEq/L (ref 96–112)
Creatinine, Ser: 0.74 mg/dL (ref 0.4–1.2)
GFR calc Af Amer: >60 mL/min (ref >60)
GFR calc non Af Amer: >60 mL/min (ref >60)
Glucose, Bld: 189 mg/dL — ABNORMAL HIGH (ref 70–99)
Potassium: 3.7 mEq/L (ref 3.5–5.1)
Sodium: 139 mEq/L (ref 135–145)

## 2010-05-05 LAB — CBC
HCT: 32.3 % — ABNORMAL LOW (ref 36.0–46.0)
Hemoglobin: 11.3 g/dL — ABNORMAL LOW (ref 12.0–15.0)
MCHC: 35 g/dL (ref 30.0–36.0)
MCV: 91.9 fL (ref 78.0–100.0)
Platelets: 162 10*3/uL (ref 150–400)
RBC: 3.51 MIL/uL — ABNORMAL LOW (ref 3.87–5.11)
RDW: 14.2 % (ref 11.5–15.5)
WBC: 9.6 10*3/uL (ref 4.0–10.5)

## 2010-05-05 LAB — DIFFERENTIAL
Basophils Absolute: 0 10*3/uL (ref 0.0–0.1)
Basophils Relative: 1 % (ref 0–1)
Eosinophils Absolute: 0.3 10*3/uL (ref 0.0–0.7)
Eosinophils Relative: 4 % (ref 0–5)
Lymphocytes Relative: 46 % (ref 12–46)
Lymphs Abs: 4.4 10*3/uL — ABNORMAL HIGH (ref 0.7–4.0)
Monocytes Absolute: 0.7 10*3/uL (ref 0.1–1.0)
Monocytes Relative: 8 % (ref 3–12)
Neutro Abs: 4.1 10*3/uL (ref 1.7–7.7)
Neutrophils Relative %: 42 % — ABNORMAL LOW (ref 43–77)

## 2010-05-05 LAB — BASIC METABOLIC PANEL
BUN: 16 mg/dL (ref 6–23)
CO2: 24 mEq/L (ref 19–32)
Calcium: 9.1 mg/dL (ref 8.4–10.5)
Chloride: 107 mEq/L (ref 96–112)
Creatinine, Ser: 1 mg/dL (ref 0.4–1.2)
GFR calc Af Amer: >60 mL/min (ref >60)
GFR calc non Af Amer: 57 mL/min — ABNORMAL LOW (ref >60)
Glucose, Bld: 250 mg/dL — ABNORMAL HIGH (ref 70–99)
Potassium: 3.5 mEq/L (ref 3.5–5.1)
Sodium: 140 mEq/L (ref 135–145)

## 2010-05-05 LAB — POCT CARDIAC MARKERS
CKMB, poc: 4.2 ng/mL (ref 1.0–8.0)
Myoglobin, poc: 74.1 ng/mL (ref 12–200)
Troponin i, poc: <0.05 ng/mL (ref 0.00–0.09)

## 2010-05-06 LAB — URINE MICROSCOPIC-ADD ON

## 2010-05-06 LAB — URINALYSIS, ROUTINE W REFLEX MICROSCOPIC
Bilirubin Urine: NEGATIVE
Glucose, UA: NEGATIVE mg/dL
Hgb urine dipstick: NEGATIVE
Ketones, ur: NEGATIVE mg/dL
Nitrite: NEGATIVE
Protein, ur: NEGATIVE mg/dL
Specific Gravity, Urine: 1.011 (ref 1.005–1.030)
Urobilinogen, UA: 0.2 mg/dL (ref 0.0–1.0)
pH: 6 (ref 5.0–8.0)

## 2010-05-20 ENCOUNTER — Other Ambulatory Visit: Payer: Self-pay | Admitting: Orthopedic Surgery

## 2010-05-20 DIAGNOSIS — R2230 Localized swelling, mass and lump, unspecified upper limb: Secondary | ICD-10-CM

## 2010-05-22 ENCOUNTER — Other Ambulatory Visit: Payer: Self-pay

## 2010-06-10 NOTE — Discharge Summary (Signed)
NAME:  Michelle Holmes, Michelle Holmes                  ACCOUNT NO.:  [id number]   MEDICAL RECORD NO.:  [id number]          PATIENT TYPE:  INP   LOCATION:  6524                         FACILITY:  [facility]   PHYSICIAN:  Mohan N. Terrence Dupont, M.D. DATE OF BIRTH:  03/03/1949   DATE OF ADMISSION:  09/28/2007  DATE OF DISCHARGE:  09/30/2007                               DISCHARGE SUMMARY   ADMITTING DIAGNOSES:  1. New-onset angina, rule out myocardial infarction.  2. Coronary artery disease.  3. Hypertension.  4. Non-insulin-dependent diabetes mellitus.  5. Morbid obesity.  6. Scoliosis.  7. Hypercholesteremia.  8. Carcinoma of colon.  9. Remote history of tobacco abuse.   DISCHARGE DIAGNOSES:  1. Status post new-onset angina, status post left cath, and      intravascular ultrasound, and PTCA stenting to proximal LAD.  2. Hypertension.  3. Non-insulin-dependent diabetes mellitus.  4. Hypercholesteremia.  5. Morbid obesity.  6. Scoliosis.  7. History of carcinoma of colon.  8. Remote history of tobacco abuse.   MEDICATIONS:  1. Enteric-coated aspirin  325 mg 1 tablet daily.  2. Plavix 75 mg 1 tablet daily with food.  3. Toprol-XL 50 mg 1 tablet daily.  4. Lotrel 5/20 one twice daily.  5. Crestor 10 mg 1 tablet daily.  6. Metformin 500 mg 1 tablet twice daily with food, starting from      tomorrow.  7. Nitrostat 0.4 mg, sublingual use as directed.   DIET:  Low salt, low cholesterol, 1800 calorie ADA diet.   The patient has been advised to monitor blood pressure and blood sugar  daily.  The patient has also been advised to reduce weight.  The patient  is scheduled for phase II cardiac rehab as outpatient.  Post PTCA stent  instructions have been given.   ACTIVITY:  Increase activity slowly.  The patient has been advised to  avoid driving, lifting, pushing or pulling.   BMET and basic metabolic panel and magnesium in 1 week.   CONDITION AT DISCHARGE:  Stable.   BRIEF HISTORY AND HOSPITAL COURSE:   [name] is 60 year old black  female with past medical history significant for coronary artery  disease, hypertension, history of congestive heart failure secondary to  diastolic dysfunction, morbid obesity, scoliosis, history of CA of  colon, non-insulin-dependent diabetes mellitus controlled by diet came  to the ER complaining of retrosternal chest pain, grade 8/10 associated  with toothache that lasted for 10-15 minutes, relieved with sublingual  nitro.  Denies any nausea, vomiting, diaphoresis.  Denies shortness of  breath.  Denies PND, orthopnea, or leg swelling.  Denies exertional  chest pain although her activities limited due to scoliosis and obesity.  Does feel tired and fatigued lately, denies relation of chest pain to  food, breathing, or movement.  Denies fever, chills, cough.  Denies  chest pain at present.   PAST MEDICAL HISTORY:  As above.   PAST SURGICAL HISTORY:  She had partial colectomy in the past, had  appendectomy and hysterectomy in the past.   SOCIAL HISTORY:  Widowed.  Four children.  Smoked for  5 years, quit in  1987.  No history of alcohol abuse.  She was born in [place],  lives in [place].   FAMILY HISTORY:  Negative for coronary artery disease.   ALLERGIES:  She is allergic to PENICILLIN.   MEDICATION AT HOME:  She is on:  1. Enteric-coated aspirin 81 mg p.o. daily.  2. Plavix 75 mg p.o. daily.  3. Toprol-XL 50 mg p.o. daily.  4. Lasix 40 mg p.o. daily.  5. Lotrel 5/20 p.o. b.i.d.  6. Percocet p.r.n. for pain.   PHYSICAL EXAMINATION:  GENERAL:  She is alert, awake, oriented x3, in no  acute distress.  VITAL SIGNS:  Blood pressure was 113/84, pulse was 81, regular.  HEENT:  Conjunctivae pink.  NECK:  Supple, no JVD, no bruit.  LUNGS:  Clear to auscultation without rhonchi or rales.  CARDIOVASCULAR:  S1-S2 normal.  There was soft systolic murmur.  ABDOMEN:  Soft.  Bowel sounds are present, obese, nontender.  EXTREMITIES:  There is  no clubbing, cyanosis, or edema.   LABORATORY:  Hemoglobin was 11.3, hematocrit 32.5, white count of 9.6.  Glucose 237, potassium 3.2, which was replaced.  BUN 14, creatinine  0.90.  EKG showed normal sinus rhythm with minor T-wave changes in the  anterior leads.  CPK-MB and troponin I were negative.   BRIEF HOSPITAL COURSE:  The patient was admitted to telemetry unit.  MI  was ruled out by serial enzymes and EKG.  I discussed with the patient  at length regarding noninvasive stress testing versus left heart  catheterization with PTCA and stenting, its risks and benefits, and the  patient subsequently underwent left cardiac cath and PTCA stenting and  intravascular ultrasound as per procedure report.  The patient tolerated  the procedure well.  There were no complications.  The patient did not  have any episodes of chest pain after the PTCA stenting.  __________  cardiac  rehab was called.  The patient has been ambulating in hallway without  any problems.  Her groin is  stable with no evidence of hematoma or  bruit.  Postprocedure, her EKG shows nonspecific T-wave changes.  The  patient will be scheduled for phase II cardiac rehab as an outpatient.      Allegra Lai. Terrence Dupont, M.D.  Electronically Signed     MNH/MEDQ  D:  09/30/2007  T:  10/01/2007  Job:  [id number]

## 2010-06-10 NOTE — Cardiovascular Report (Signed)
NAME:  Michelle Holmes, Michelle Holmes                  ACCOUNT NO.:  [id number]   MEDICAL RECORD NO.:  [id number]          PATIENT TYPE:  INP   LOCATION:  6524                         FACILITY:  [facility]   PHYSICIAN:  Mohan N. Terrence Dupont, M.D. DATE OF BIRTH:  03/03/1949   DATE OF PROCEDURE:  09/29/2007  DATE OF DISCHARGE:                            CARDIAC CATHETERIZATION   PROCEDURES:  1. Left cardiac cath with selective left and right coronary      angiography, LV graphy via right groin using Judkins technique.  2. Intravascular ultrasound of proximal and mid junction of left      anterior descending coronary artery.  3. Successful PTCA to proximal LAD using 3.5 x 12 mm long Voyager      balloon.  4. Successful deployment of 4.0 x 23-mm long Promus drug-eluting stent      in proximal LAD.  5. Successful post dilatation of Promus drug-eluting stent using 5.0 x      12-mm long [place] Voyager balloon.   INDICATIONS FOR PROCEDURE:  Michelle Holmes is a 60 year old black female with  past medical history significant for coronary artery disease,  hypertension, history of congestive heart failure secondary to diastolic  dysfunction, morbid obesity, scoliosis, degenerative joint disease,  history of CA of colon, non-insulin-dependent diabetes mellitus.  She  came to the ER complaining of retrosternal chest pain, grade 8/10  associated with tooth ache, lasted 10-15 minutes.  Chest pain got  relieved with sublingual nitro.  Denies any nausea, vomiting,  diaphoresis.  Denies shortness of breath.  Denies PND, orthopnea, or leg  swelling.  Denies exertional chest pain, although activity is limited  due to scoliosis and obesity.  States feels tired and fatigued.  Denies  any relation of chest pain to food, breathing, or movement.  Denies  fever, chills, cough.  Denies chest pain at present.  EKG done in the ER  showed minor T-wave inversion in anterior leads.  The patient was  admitted to telemetry unit.  MI was ruled out by  serial enzymes and EKG,  discussed with the patient at length regarding various options of  treatment i.e. noninvasive stress testing versus left cath, possible  PTCA stenting, its risks and benefits i.e. death, MI, stroke, need for  emergency CABG, risk of restenosis, local vascular complications etc.  and consented for the procedure.   PROCEDURE IN DETAIL:  After obtaining the informed consent, the patient  was brought to the cath lab and was placed on fluoroscopy table.  Right  groin was prepped and draped in usual fashion.  Xylocaine 2%  was used  for local anesthesia in the right groin.  With the help of thin-walled  needle, 6-French arterial sheath was placed.  The sheath was aspirated  and flushed.  Next, 6-French left Judkins catheter was advanced over the  wire under fluoroscopic guidance up to the ascending aorta.  Wire was  pulled out, the catheter was aspirated and connected to the manifold.  Catheter was further advanced and engaged into left coronary ostium.  Multiple views of the left system were taken.  Next,  the catheter was  disengaged and was pulled out over the wire and was replaced with 6-  [place] right Judkins catheter, which was advanced over the wire under  fluoroscopic guidance up to the ascending aorta.  Wire was pulled out,  the catheter was aspirated and connected to the manifold.  Catheter was  further advanced and engaged into right coronary ostium.  Multiple views  of the right system were taken.  Next, the catheter was disengaged and  was pulled out over the wire and was replaced with 6-French pigtail  catheter, which was advanced over the wire under fluoroscopic guidance  into the ascending aorta.  Wire was pulled out, the catheter was  aspirated and connected to the manifold.  Catheter was further advanced  across the aortic valve into the LV.  LV pressures were recorded.  Next,  LV graft was done in 30-degree RAO position.  Post angiographic  pressures  were recorded from LV and then pullback pressures were  recorded from the aorta.   FINDINGS:  LV showed good LV systolic function, EF of 123456, left main  was patent.  LAD has proximal 60%-70% stenosis and by IVUS 75% stenosis  with 360-degree calcification.  Diagonal I was moderate size, which was  patent.  Diagonal II and III were very very small.  Left circumflex was  large, which was patent.  OM-1 was very small.  OM-2 was large, which  was patent.  RCA was 100% occluded beyond proximal portion, which was  filling by collaterals from the left system.  Intravascular ultrasound  was done prior to PCI to LAD and was noted to have 75% proximal stenosis  with 360-degree calcium.  Interventional procedure successful PTCA to  proximal LAD was done using 3.5 x 12-mm long Voyager balloon for  predilatation and then 4.0 x 23-mm long Promus drug-eluting stent was  deployed at 15 atmosphere pressure and proximal LAD stent was  postdilated using 5.0 x 12-mm long [place] Voyager balloon going up to 20  atmosphere pressure achieving the lumen of 5.27-mm lesion was dilated  from 75% -0% residual with excellent TIMI grade 3 distal flow without  evidence of dissection or distal embolization.  The patient received  weight-based Angiomax and 600 mg of Plavix during the procedure.  The  patient tolerated the procedure well.  There were no complications.  The  patient was transferred to recovery room in stable condition.      Allegra Lai. Terrence Dupont, M.D.  Electronically Signed     MNH/MEDQ  D:  09/29/2007  T:  09/29/2007  Job:  [id number]   cc:   Cath Lab

## 2010-06-13 NOTE — Discharge Summary (Signed)
NAME:  Michelle Holmes, Michelle Holmes                            ACCOUNT NO.:  [id number]   MEDICAL RECORD NO.:  [id number]                   PATIENT TYPE:  INP   LOCATION:  2032                                 FACILITY:  [facility]   PHYSICIAN:  Allegra Lai. Terrence Dupont, M.D.              DATE OF BIRTH:  Jan 15, 1951   DATE OF ADMISSION:  02/15/2002  DATE OF DISCHARGE:  02/17/2002                                 DISCHARGE SUMMARY   ADMITTING DIAGNOSES:  1. Chest pain, rule out myocardial infarction.  2. Hypertension.  3. Morbid obesity.  4. Anxiety disorder.  5. Compensated congestive heart failure.  6. Bronchitis.   DISCHARGE DIAGNOSES:  1. Probable very small non-Q-wave myocardial infarction.  2. Hypertension.  3. Morbid obesity.  4. Anxiety disorder.  5. Bronchitis.  6. Compensated congestive heart failure.   DISCHARGE HOME MEDICATIONS:  1. Enteric-coated aspirin 81 mg one tablet daily.  2. Plavix 75 mg one tablet daily with food.  3. Toprol-XL 25 mg one tablet daily.  4. Altace 5 mg one capsule daily.  5. Nitro-Dur 0.4 mg/hr applied to chest wall in the a.m., off at night.  6. Nitrostat 0.4 mg sublingual -- use as directed.   ACTIVITY:  The patient has been advised to avoid heavy lifting, pushing or  pulling for 48 hours.   DIET:  Low-salt, low-cholesterol, weight-reducing diet.   DISCHARGE INSTRUCTIONS:  Post-cardiac-catheterization instructions have been  given.   FOLLOWUP:  Follow up with me in one week.   CONDITION AT DISCHARGE:  Stable.   BRIEF HISTORY:  The patient is a 60 year old black female with past medical  history significant for hypertension, congestive heart failure secondary to  diastolic function, morbid obesity, history of CA of the colon,  postmenopausal.  She came to the office complaining of chest pain localized  in the retrosternal area without associated symptoms, lasted a few minutes.  Last night, went to ER and had EKG done which showed nonspecific T wave  changes  but was noted to have elevated CPK total of 242 with MB of 6.6 and  relative index of 2.7 and troponin was negative.  The patient signed out  against medical advice and came to the office for further evaluation.  The  patient presently denies any chest pain, nausea, vomiting, diaphoresis,  palpitation, lightheadedness or syncope.  Denies PND, orthopnea or leg  swelling.  Denies shortness of breath.  Complains of cough with sore throat  and yellowish phlegm for which she is being treated with Tequin.  The  patient denies any fevers or chills.   PAST MEDICAL HISTORY:  As above.   PAST SURGICAL HISTORY:  She had partial colectomy for CA of colon and  appendectomy and hysterectomy in 1984.   SOCIAL HISTORY:  She is widowed and has four children, smoked for five  years, quit in 1987, no history of alcohol abuse, born in [place]  [place],  lives in [place].   FAMILY HISTORY:  Family history is negative for coronary artery disease.   MEDICATIONS AT HOME:  1. Lotrel 5/20 mg one capsule twice daily.  2. Xanax 0.25 mg twice daily.  3. Enteric-coated aspirin on a p.r.n. basis.   PHYSICAL EXAMINATION:  GENERAL:  On examination, she is alert, awake,  oriented x3, in no acute distress.  VITAL SIGNS:  Blood pressure was 140/80, pulse was 76, regular.  HEENT:  Conjunctivae were pink.  NECK:  Neck supple.  No JVD.  No bruit.  LUNGS:  Lungs are clear to auscultation without rhonchi or rales.  CARDIOVASCULAR:  S1 and S2 were normal.  There was no S3 gallop.  ABDOMEN:  Abdomen was soft.  Bowel sounds were present.  Obese, nontender.  EXTREMITIES:.  There was no clubbing, cyanosis, or edema.   LABORATORY AND ACCESSORY CLINICAL DATA:  EKG showed normal sinus rhythm with  nonspecific T wave changes.   Her first-set CPK done earlier this morning was 242, MB of 6.6, relative  index 2.7; second-set CK 207, MB 6.2, relative index 3.0; third-set CK 182,  MB 4.7, relative index 2.6; fourth-set CK 115, MB  of 3.1.  Troponin I,  however, three sets were negative.  Cholesterol was 140, LDL of 72, HDL of  39.  High-sensitivity C-reactive protein was also elevated at 13.8, but the  patient did have evidence of bronchitis for which she is being treated.  Her  potassium was 3.7, glucose was 142, repeat blood sugar was 111, BUN 10,  creatinine 0.7; hemoglobin was 11.7, hematocrit 33.3, white count of 9.4.   She had a Persantine Cardiolite on February 17, 2002 which showed normal  myocardial perfusion with left ventricular ejection fraction of 63%.   BRIEF HOSPITAL COURSE:  The patient was admitted to the hospital; she was  admitted to the telemetry unit.  The patient ruled in for a small non-Q-wave  MI due to typical anginal pain and mildly elevated CPKs.  The patient's  subsequently underwent left cardiac catheterization on February 16, 2002, as  per procedure report.  Patient tolerated the procedure well.  There were no  complications.  The patient had proximal 50-60% LAD stenosis.  In view of  borderline LAD lesion and mildly elevated CPK-MB and recurrent chest pain,  the patient underwent a Persantine Cardiolite to evaluate the significance  of above lesions.  The patient had borderline lesions in the left circumflex  and LAD and due to recurrent chest pain and mildly elevated CPK-MB, the  patient underwent Persantine Cardiolite,  which showed no evidence of reversible ischemia with normal LV function.  The patient did not have further episodes of chest pain during the hospital  stay.  The patient was discharged home on above medications and will be  followed up in my office in one week.                                               Allegra Lai. Terrence Dupont, M.D.    MNH/MEDQ  D:  03/23/2002  T:  03/23/2002  Job:  [id number]

## 2010-06-13 NOTE — Discharge Summary (Signed)
[facility]  Patient:    Michelle Holmes, Michelle Holmes                         MRN: [id number] Adm. Date:  [id number] Disc. Date: [id number] Attending:  Clent Demark CC:         Leola Brazil., M.D.   Discharge Summary  ADMISSION DIAGNOSES: 1. Chest pain, rule out coronary infection. 2. Uncontrolled hypertension. 3. History of congestive heart failure. 4. Morbid obesity. 5. Remote history of tobacco abuse.  FINAL DIAGNOSES: 1. Apical chest pain.  Negative Persantine Cardiolite. 2. Hypertension. 3. Compensated congestive heart failure. 4. Morbid obesity. 5. Remote history of tobacco abuse.  DISCHARGE HOME MEDICATIONS:  The patient has been advised to continue her home medications, i.e. Zestril 20 mg one daily, Isordil one tablet daily, and Lasix 40 mg every day as before.  DIET:  Low salt, low cholesterol.  ACTIVITY:  As tolerated.  FOLLOW-UP:  The patient has been advised to follow up with Leola Brazil., M.D., in two weeks.  CONDITION ON DISCHARGE:  Stable.  BRIEF HISTORY:  Michelle Holmes is a 60 year old black female with a past medical history significant for hypertension, congestive heart failure, morbid obesity, and remote history of tobacco abuse.  She came to the ER early in the morning, complaining of retrosternal chest pain which started yesterday around 1 p.m., lasting a few minutes, and relieved after drinking Coke.  She again had chest soreness while in [employer] around 9 p.m.  She drank Coke without relief this time.  The pain lasted for approximately 30 minutes, so she decided to come to the ER.  Denies any nausea, vomiting, or diaphoresis.  The chest pain was localized.  She denies any shortness of breath, palpitations, lightheadedness, or syncope.  She denies PND, orthopnea, or leg swelling.  No history of exertional angina, but states that the soreness gets relieved with Isordil in the past.  Denies any relation  of chest pain to full breathing. Denies any fever or chills.  States that she had cardiac work-up many years ago at [facility] where she was told to have an enlarged heart.  PAST MEDICAL HISTORY:  As above.  PAST SURGICAL HISTORY:  She had a partial colectomy for CA of the colon.  She had an appendectomy many years ago and a hysterectomy in 1984.  MEDICATIONS:  She was on Zestril, Isordil, and Lasix.  SOCIAL HISTORY:  She is widowed.  Four children.  Born in [place]. Lives in [place], [place], since 1977.  Works at [employer].  FAMILY HISTORY:  Mother died in a car accident.  Father died also in a car accident.  No history of coronary artery disease in the family.  PHYSICAL EXAMINATION:  She is alert, awake, and oriented x 3 in no acute distress.  VITAL SIGNS:  The blood pressure was 155/80, pulse was 74 and regular, and afebrile.  HEENT:  The conjunctivae were pink.  NECK:  Supple.  No JVD.  No bruits.  LUNGS:  Clear to auscultation without rhonchi or rales.  CARDIOVASCULAR:  S1 and S2 were normal.  There was no S3 or S4 gallop.  There was no rub.  ABDOMEN:  Soft.  Bowel sounds were present.  Nontender.  EXTREMITIES:  There was no clubbing, cyanosis, or edema.  LABORATORY DATA:  The hemoglobin was 11.8, hematocrit 33.8, and white count 10.0.  CPK and  troponin I were negative.  The BUN was 12 and creatinine 1.1.  The potassium was 3.5.  The EKG showed a normal sinus rhythm with occasional PVCs and nonspecific T-wave abnormalities.  BRIEF HOSPITAL COURSE:  The patient was admitted to the telemetry unit.  MI was ruled out by serial enzymes and EKG.  The patient underwent a Persantine Cardiolite today on May 28, 2000, which showed no evidence of reversible ischemia with an ejection fraction of 64%.  The patient did not have any episodes of chest pain during the hospital stay.  She was discharged home on the above medications.  She was  advised to follow up with Leola Brazil., M.D., in two weeks. DD:  05/28/00 TD:  05/31/00 Job: [id number]

## 2010-06-13 NOTE — Procedures (Signed)
[facility]  Patient:    Michelle Holmes, Michelle Holmes                           MRN: [id number] Proc. Date: 08/07/99 Adm. Date:  [id number] Attending:  Ernie Avena CC:         Eston Esters, M.D.                           Procedure Report  PROCEDURE PERFORMED:  Upper endoscopy.  ENDOSCOPIST:  Cleotis Nipper, M.D.  INDICATIONS FOR PROCEDURE:  Anorexia and severe weight loss in a 60 year old female with a history of colon cancer.  FINDINGS:  Esophageal ring above small hiatal hernia.  No source of anorexia or weight loss evident.  DESCRIPTION OF PROCEDURE:  The nature, purpose and risks of the procedure had been discussed with the patient, who provided written consent.  Sedation was fentanyl 50 mcg and Versed 4 mg IV without arrhythmias or desaturation. The Olympus small caliber adult video endoscope was passed under direct vision.  I did not see the vocal cords well during this exam.  The esophagus was entered with just mild difficulty.  The esophageal mucosa was normal without evidence of reflux esophagitis, Barretts esophagus, varices, infection or neoplasia.  There was a widely patent esophageal ring and below this was a 2 to 3 cm hiatal hernia.  The stomach was entered.  It contained no significant residual and had normal mucosa throughout its entire surface, without evidence of gastritis, erosions, ulcers, polyps masses or vascular malformations.  Retroflex viewing showed a slightly patulous diaphragmatic hiatus and the hiatal hernia as seen from its inferior perspective.  The pyolorus, duodenal bulb and second duodenum also looked normal.  The scope was then removed from the patient who tolerated the procedure well without apparent complications.  IMPRESSION: 1. No source of anorexia or weight loss evident on this exam, specifically, no    evidence of gastric cancer or ulcer disease. 2. Small hiatal hernia with esophageal ring.  PLAN:   Proceed to colonoscopic evaluation. DD:  08/07/99 TD:  08/07/99 Job: [id number]

## 2010-06-13 NOTE — Procedures (Signed)
[place]. [facility]  Patient:    [place], [place]                           MRN: [id number] Proc. Date: 08/07/99 Adm. Date:  [id number] Attending:  Ernie Avena CC:         Eston Esters, M.D.                           Procedure Report  PROCEDURE:  Colonoscopy.  INDICATION:  A 60 year old who is now approximately three years status post a hemicolectomy for stage III colon cancer, with recent weight loss and an abdominal CT raising the question of some mild sigmoid thickening but no frank mass effect.  FINDINGS:  Normal exam to the ileocolonic anastomosis.  DESCRIPTION OF PROCEDURE:  The nature, purpose, and risks of the procedure had been discussed with the patient, who provided written consent.  Sedation for this procedure and the upper endoscopy which preceded it totalled fentanyl 100 mcg and Versed 7 mg IV without arrhythmias or desaturation.  The Olympus adult video colonoscope was easily advanced to the ileocolonic anastomosis, after which pullback was initiated.  This was a grossly normal exam.  There was a little bit of retained stool debris here and there which could conceivably have obscured tiny lesions, but it is felt that no diffuse mucosal abnormalities or significant mass lesions would have been missed.  Specifically, I did not see any evidence of polyps, cancer, colitis, vascular malformations, or diverticular disease other than possibly one solitary large diverticulum in the sigmoid region.  Retroflexion in the rectum was unremarkable.  No biopsies were obtained.  The patient tolerated the procedure well, and there were no apparent complications.  IMPRESSION:  Normal surveillance colonoscopy three years status post right hemicolectomy for colon cancer.  PLAN:  Consider a follow-up colonoscopy in five years. DD:  08/07/99 TD:  08/07/99 Job: [id number]

## 2010-06-13 NOTE — Cardiovascular Report (Signed)
NAME:  Michelle [place], Michelle Holmes                            ACCOUNT NO.:  [id number]   MEDICAL RECORD NO.:  [id number]                   PATIENT TYPE:  OUT   LOCATION:  CATS                                 FACILITY:  [facility]   PHYSICIAN:  Mohan N. Terrence Dupont, M.D.              DATE OF BIRTH:  11-03-1950   DATE OF PROCEDURE:  02/16/2002  DATE OF DISCHARGE:  02/27/2002                              CARDIAC CATHETERIZATION   PROCEDURE:  Left cardiac catheterization with selective right and left  coronary angiography via the right groin using Judkins technique.   INDICATIONS:  The patient is a 60 year old black female with past medical  history significant for hypertension, history of congestive heart failure  secondary to diastolic dysfunction, morbid obesity, history of carcinoma of  the colon, postmenopausal.  She came to the office complaining of  retrosternal chest pain without associated symptoms lasting a few minutes  last night.  She went to the ER and had an EKG which showed nonspecific EKG  changes but was noted to have elevated CPK of 242 and MB of 6.6 with an  index of 2.7.  Her troponin was negative.  The patient signed out against  medical advise and came to the office for further evaluation this morning.  The patient presently denies any chest pain, nausea, vomiting, diaphoresis,  palpation, lightheadedness or syncope.  Denies PND, orthopnea or leg  swelling.  Denies shortness of breath.  Complains of cough and sore throat  and yellowish phlegm for which was treated with Tequin.  The patient was  admitted to the telemetry unit.  The patient ruled in for small non-Q wave  myocardial infarction due to elevated CPKs.  Her second CPK was 207 and MB  with 6.2 with an index of 3.0.  Third set CK was 182, MB 4.7, total index  2.7.  Three sets of troponin were negative.  Due to the continued anginal  pain and mildly positive CPKs, discussed with the patient various options of  treatment, i.e.,  noninvasive stress testing was discussed, also PTCA and  stenting, __________, stroke, the need for emergency CABG, risk of  restenosis, localized complications, etc.  Consented for PCI.   DESCRIPTION OF PROCEDURE:  After obtaining the informed consent, the patient  as brought to the catheterization lab and was placed on the fluoroscopy  table.  The right groin was prepped and draped in the usual fashion.  Then,  2% Xylocaine was used for local anesthesia in the right groin.  With the  help of a thin wall needle, a 6 French arterial sheath was placed.  The  sheath was aspirated and flushed.  Next, 6 French left Judkins catheter was  advanced over the wire under fluoroscopic guidance up to the ascending  aorta.  The wire was pulled out.;  The catheter was aspirated and connected  to the manifold.  The catheter was further advanced  and engaged into the  left coronary ostia.  Multiple views of the left system were taken.  Next,  the catheter was disengaged and was pulled out over the wire and was  replaced with 6 French right Judkins catheter which was advanced over the  wire under fluoroscopic guidance to the ascending aorta.  The wire was  pulled out.  The catheter was aspirated and connected to the manifold.  The  catheter was further advanced and engaged to the right coronary ostium.  Multiple views of the right system were taken.  Next, the catheter was  disengaged and was pulled out over the wire was replaced with 6 French  pigtail catheter which was advanced over the wire under fluoroscopic  guidance up to the ascending aorta.  The wire was pulled out.  The catheter  was aspirated and connected to the manifold.  The catheter was further  advanced across the aortic valve into the LV.  The LV pressures were  recorded.  Next, left ventriculography was done in 30 degree RAO position.  Post angiographic pressures were recorded from the LV and pullback pressures  were recorded from the  aorta.  There was no gradient across the aortic  valve.  Next, the pigtail catheter was pulled out over the wire.  The  sheaths were aspirated and flushed.   The patient tolerated the procedure well.  There were no complications.  The  patient was transferred to the recovery room in stable condition.   FINDINGS:  1. The LV showed good LV systolic function.  There was LVH.  2. The left main was patent.  3. The LAD has 50-60% proximal stenosis.  4. The diagonal I was very, very small which was patent.  5. The diagonal II was small which was patent.  6. The left circumflex has 40-50% proximal stenosis.  7. The OM-1 was very small which was patent.  The OM-2 was large; it was     patent.  The ostia had 20-30% mid stenosis.  8. The patient has codominant coronary system.   PLAN:  The plan is to schedule the patient for a Persantine Cardiolite in  view of borderline LAD lesion to evaluate this for above lesion.  If  Cardiolite study shows evidence of ischemia then we will consider PCI.                                               Allegra Lai. Terrence Dupont, M.D.    MNH/MEDQ  D:  03/23/2002  T:  03/23/2002  Job:  [id number]   cc:   Derwood Kaplan  [place]  [place] [address]  Fax: [phone]

## 2010-10-12 ENCOUNTER — Inpatient Hospital Stay (HOSPITAL_COMMUNITY)
Admission: EM | Admit: 2010-10-12 | Discharge: 2010-10-17 | DRG: 638 | Disposition: A | Discharge status: Home / Self Care | Payer: No Typology Code available for payment source | Attending: Cardiology | Admitting: Cardiology

## 2010-10-12 ENCOUNTER — Emergency Department (HOSPITAL_COMMUNITY): Payer: No Typology Code available for payment source

## 2010-10-12 DIAGNOSIS — J9819 Other pulmonary collapse: Secondary | ICD-10-CM | POA: Diagnosis present

## 2010-10-12 DIAGNOSIS — I2510 Atherosclerotic heart disease of native coronary artery without angina pectoris: Secondary | ICD-10-CM | POA: Diagnosis present

## 2010-10-12 DIAGNOSIS — G4733 Obstructive sleep apnea (adult) (pediatric): Secondary | ICD-10-CM | POA: Diagnosis present

## 2010-10-12 DIAGNOSIS — Z794 Long term (current) use of insulin: Secondary | ICD-10-CM

## 2010-10-12 DIAGNOSIS — Z9861 Coronary angioplasty status: Secondary | ICD-10-CM

## 2010-10-12 DIAGNOSIS — E7800 Pure hypercholesterolemia, unspecified: Secondary | ICD-10-CM | POA: Diagnosis present

## 2010-10-12 DIAGNOSIS — J40 Bronchitis, not specified as acute or chronic: Secondary | ICD-10-CM | POA: Diagnosis present

## 2010-10-12 DIAGNOSIS — N390 Urinary tract infection, site not specified: Secondary | ICD-10-CM | POA: Diagnosis present

## 2010-10-12 DIAGNOSIS — Z87891 Personal history of nicotine dependence: Secondary | ICD-10-CM

## 2010-10-12 DIAGNOSIS — I10 Essential (primary) hypertension: Secondary | ICD-10-CM | POA: Diagnosis present

## 2010-10-12 DIAGNOSIS — Z7902 Long term (current) use of antithrombotics/antiplatelets: Secondary | ICD-10-CM

## 2010-10-12 DIAGNOSIS — I509 Heart failure, unspecified: Secondary | ICD-10-CM | POA: Diagnosis present

## 2010-10-12 DIAGNOSIS — M1990 Unspecified osteoarthritis, unspecified site: Secondary | ICD-10-CM | POA: Diagnosis present

## 2010-10-12 DIAGNOSIS — IMO0001 Reserved for inherently not codable concepts without codable children: Principal | ICD-10-CM | POA: Diagnosis present

## 2010-10-12 DIAGNOSIS — M4120 Other idiopathic scoliosis, site unspecified: Secondary | ICD-10-CM | POA: Diagnosis present

## 2010-10-12 DIAGNOSIS — I5032 Chronic diastolic (congestive) heart failure: Secondary | ICD-10-CM | POA: Diagnosis present

## 2010-10-12 DIAGNOSIS — Z8673 Personal history of transient ischemic attack (TIA), and cerebral infarction without residual deficits: Secondary | ICD-10-CM

## 2010-10-12 DIAGNOSIS — E662 Morbid (severe) obesity with alveolar hypoventilation: Secondary | ICD-10-CM | POA: Diagnosis present

## 2010-10-12 DIAGNOSIS — A498 Other bacterial infections of unspecified site: Secondary | ICD-10-CM | POA: Diagnosis present

## 2010-10-12 DIAGNOSIS — Z85038 Personal history of other malignant neoplasm of large intestine: Secondary | ICD-10-CM

## 2010-10-12 DIAGNOSIS — Z7982 Long term (current) use of aspirin: Secondary | ICD-10-CM

## 2010-10-12 LAB — URINALYSIS, ROUTINE W REFLEX MICROSCOPIC
Bilirubin Urine: NEGATIVE
Glucose, UA: >1000 mg/dL — AB
Ketones, ur: NEGATIVE mg/dL
Nitrite: POSITIVE — AB
Protein, ur: 100 mg/dL — AB
Specific Gravity, Urine: 1.03 (ref 1.005–1.030)
Urobilinogen, UA: 1 mg/dL (ref 0.0–1.0)
pH: 5.5 (ref 5.0–8.0)

## 2010-10-12 LAB — URINE MICROSCOPIC-ADD ON

## 2010-10-12 LAB — GLUCOSE, CAPILLARY: Glucose-Capillary: 530 mg/dL — ABNORMAL HIGH (ref 70–99)

## 2010-10-13 LAB — BASIC METABOLIC PANEL
BUN: 23 mg/dL (ref 6–23)
CO2: 24 mEq/L (ref 19–32)
Calcium: 9.5 mg/dL (ref 8.4–10.5)
Chloride: 95 mEq/L — ABNORMAL LOW (ref 96–112)
Creatinine, Ser: 1.57 mg/dL — ABNORMAL HIGH (ref 0.50–1.10)
GFR calc Af Amer: 41 mL/min — ABNORMAL LOW (ref >60)
GFR calc non Af Amer: 34 mL/min — ABNORMAL LOW (ref >60)
Glucose, Bld: 503 mg/dL — ABNORMAL HIGH (ref 70–99)
Potassium: 3.8 mEq/L (ref 3.5–5.1)
Sodium: 131 mEq/L — ABNORMAL LOW (ref 135–145)

## 2010-10-13 LAB — COMPREHENSIVE METABOLIC PANEL
ALT: 11 U/L (ref 0–35)
AST: 12 U/L (ref 0–37)
Albumin: 2.1 g/dL — ABNORMAL LOW (ref 3.5–5.2)
Alkaline Phosphatase: 104 U/L (ref 39–117)
BUN: 23 mg/dL (ref 6–23)
CO2: 22 mEq/L (ref 19–32)
Calcium: 8.9 mg/dL (ref 8.4–10.5)
Chloride: 100 mEq/L (ref 96–112)
Creatinine, Ser: 1.57 mg/dL — ABNORMAL HIGH (ref 0.50–1.10)
GFR calc Af Amer: 41 mL/min — ABNORMAL LOW (ref >60)
GFR calc non Af Amer: 34 mL/min — ABNORMAL LOW (ref >60)
Glucose, Bld: 399 mg/dL — ABNORMAL HIGH (ref 70–99)
Potassium: 3.7 mEq/L (ref 3.5–5.1)
Sodium: 131 mEq/L — ABNORMAL LOW (ref 135–145)
Total Bilirubin: 0.4 mg/dL (ref 0.3–1.2)
Total Protein: 6.5 g/dL (ref 6.0–8.3)

## 2010-10-13 LAB — IRON AND TIBC
Iron: 10 ug/dL — ABNORMAL LOW (ref 42–135)
Saturation Ratios: 7 % — ABNORMAL LOW (ref 20–55)
TIBC: 144 ug/dL — ABNORMAL LOW (ref 250–470)
UIBC: 134 ug/dL (ref 125–400)

## 2010-10-13 LAB — CBC
HCT: 25.2 % — ABNORMAL LOW (ref 36.0–46.0)
HCT: 27.7 % — ABNORMAL LOW (ref 36.0–46.0)
Hemoglobin: 8.6 g/dL — ABNORMAL LOW (ref 12.0–15.0)
Hemoglobin: 9.6 g/dL — ABNORMAL LOW (ref 12.0–15.0)
MCH: 30.8 pg (ref 26.0–34.0)
MCH: 31 pg (ref 26.0–34.0)
MCHC: 34.1 g/dL (ref 30.0–36.0)
MCHC: 34.7 g/dL (ref 30.0–36.0)
MCV: 89.4 fL (ref 78.0–100.0)
MCV: 90.3 fL (ref 78.0–100.0)
Platelets: 145 10*3/uL — ABNORMAL LOW (ref 150–400)
Platelets: 168 10*3/uL (ref 150–400)
RBC: 2.79 MIL/uL — ABNORMAL LOW (ref 3.87–5.11)
RBC: 3.1 MIL/uL — ABNORMAL LOW (ref 3.87–5.11)
RDW: 13.4 % (ref 11.5–15.5)
RDW: 13.6 % (ref 11.5–15.5)
WBC: 11.7 10*3/uL — ABNORMAL HIGH (ref 4.0–10.5)
WBC: 9.8 10*3/uL (ref 4.0–10.5)

## 2010-10-13 LAB — DIFFERENTIAL
Basophils Absolute: 0 10*3/uL (ref 0.0–0.1)
Basophils Relative: 0 % (ref 0–1)
Eosinophils Absolute: 0 10*3/uL (ref 0.0–0.7)
Eosinophils Relative: 0 % (ref 0–5)
Lymphocytes Relative: 11 % — ABNORMAL LOW (ref 12–46)
Lymphs Abs: 1.3 10*3/uL (ref 0.7–4.0)
Monocytes Absolute: 1.1 10*3/uL — ABNORMAL HIGH (ref 0.1–1.0)
Monocytes Relative: 9 % (ref 3–12)
Neutro Abs: 9.3 10*3/uL — ABNORMAL HIGH (ref 1.7–7.7)
Neutrophils Relative %: 80 % — ABNORMAL HIGH (ref 43–77)

## 2010-10-13 LAB — GLUCOSE, CAPILLARY
Glucose-Capillary: 462 mg/dL — ABNORMAL HIGH (ref 70–99)
Glucose-Capillary: 414 mg/dL — ABNORMAL HIGH (ref 70–99)
Glucose-Capillary: 341 mg/dL — ABNORMAL HIGH (ref 70–99)
Glucose-Capillary: 273 mg/dL — ABNORMAL HIGH (ref 70–99)
Glucose-Capillary: 241 mg/dL — ABNORMAL HIGH (ref 70–99)

## 2010-10-13 LAB — HEMOGLOBIN A1C
Hgb A1c MFr Bld: 11.2 % — ABNORMAL HIGH (ref <5.7)
Mean Plasma Glucose: 275 mg/dL — ABNORMAL HIGH (ref <117)

## 2010-10-13 LAB — PROTIME-INR
INR: 1.2 (ref 0.00–1.49)
Prothrombin Time: 15.5 seconds — ABNORMAL HIGH (ref 11.6–15.2)

## 2010-10-13 LAB — VITAMIN B12: Vitamin B-12: 190 pg/mL — ABNORMAL LOW (ref 211–911)

## 2010-10-13 LAB — FOLATE: Folate: 10.5 ng/mL

## 2010-10-13 LAB — APTT: aPTT: 34 seconds (ref 24–37)

## 2010-10-13 LAB — FERRITIN: Ferritin: 927 ng/mL — ABNORMAL HIGH (ref 10–291)

## 2010-10-14 ENCOUNTER — Inpatient Hospital Stay (HOSPITAL_COMMUNITY): Payer: No Typology Code available for payment source

## 2010-10-14 LAB — CBC
HCT: 25.5 % — ABNORMAL LOW (ref 36.0–46.0)
Hemoglobin: 8.6 g/dL — ABNORMAL LOW (ref 12.0–15.0)
MCH: 30.8 pg (ref 26.0–34.0)
MCHC: 33.7 g/dL (ref 30.0–36.0)
MCV: 91.4 fL (ref 78.0–100.0)
Platelets: 142 10*3/uL — ABNORMAL LOW (ref 150–400)
RBC: 2.79 MIL/uL — ABNORMAL LOW (ref 3.87–5.11)
RDW: 13.5 % (ref 11.5–15.5)
WBC: 8.5 10*3/uL (ref 4.0–10.5)

## 2010-10-14 LAB — OCCULT BLOOD X 1 CARD TO LAB, STOOL
Fecal Occult Bld: NEGATIVE
Fecal Occult Bld: NEGATIVE

## 2010-10-14 LAB — BASIC METABOLIC PANEL
BUN: 21 mg/dL (ref 6–23)
CO2: 23 mEq/L (ref 19–32)
Calcium: 9.2 mg/dL (ref 8.4–10.5)
Chloride: 100 mEq/L (ref 96–112)
Creatinine, Ser: 1.55 mg/dL — ABNORMAL HIGH (ref 0.50–1.10)
GFR calc Af Amer: 41 mL/min — ABNORMAL LOW (ref >60)
GFR calc non Af Amer: 34 mL/min — ABNORMAL LOW (ref >60)
Glucose, Bld: 383 mg/dL — ABNORMAL HIGH (ref 70–99)
Potassium: 3.9 mEq/L (ref 3.5–5.1)
Sodium: 133 mEq/L — ABNORMAL LOW (ref 135–145)

## 2010-10-14 LAB — GLUCOSE, CAPILLARY
Glucose-Capillary: 232 mg/dL — ABNORMAL HIGH (ref 70–99)
Glucose-Capillary: 304 mg/dL — ABNORMAL HIGH (ref 70–99)

## 2010-10-14 LAB — URINE CULTURE
Colony Count: >=100000
Culture  Setup Time: 201209170953

## 2010-10-15 ENCOUNTER — Inpatient Hospital Stay (HOSPITAL_COMMUNITY): Payer: No Typology Code available for payment source

## 2010-10-15 LAB — CBC
HCT: 35.5 — ABNORMAL LOW
Hemoglobin: 12
MCHC: 33.8
MCV: 94.4
Platelets: 222
RBC: 3.77 — ABNORMAL LOW
RDW: 14.9
WBC: 10.7 — ABNORMAL HIGH

## 2010-10-15 LAB — I-STAT 8, (EC8 V) (CONVERTED LAB)
Acid-Base Excess: 1
BUN: 16
Bicarbonate: 27.8 — ABNORMAL HIGH
Chloride: 108
Glucose, Bld: 104 — ABNORMAL HIGH
HCT: 38
Hemoglobin: 12.9
Operator id: 151321
Potassium: 3.7
Sodium: 141
TCO2: 29
pCO2, Ven: 52.3 — ABNORMAL HIGH
pH, Ven: 7.334 — ABNORMAL HIGH

## 2010-10-15 LAB — POCT CARDIAC MARKERS
CKMB, poc: 3.9
CKMB, poc: 5
Myoglobin, poc: 138
Myoglobin, poc: 207
Operator id: 151321
Operator id: 151321
Troponin i, poc: <0.05
Troponin i, poc: <0.05

## 2010-10-15 LAB — GLUCOSE, CAPILLARY
Glucose-Capillary: 394 mg/dL — ABNORMAL HIGH (ref 70–99)
Glucose-Capillary: 350 mg/dL — ABNORMAL HIGH (ref 70–99)
Glucose-Capillary: 366 mg/dL — ABNORMAL HIGH (ref 70–99)
Glucose-Capillary: 329 mg/dL — ABNORMAL HIGH (ref 70–99)
Glucose-Capillary: 287 mg/dL — ABNORMAL HIGH (ref 70–99)
Glucose-Capillary: 258 mg/dL — ABNORMAL HIGH (ref 70–99)
Glucose-Capillary: 197 mg/dL — ABNORMAL HIGH (ref 70–99)
Glucose-Capillary: 228 mg/dL — ABNORMAL HIGH (ref 70–99)

## 2010-10-15 LAB — DIFFERENTIAL
Basophils Absolute: 0.1
Basophils Relative: 1
Eosinophils Absolute: 0.4
Eosinophils Relative: 3
Lymphocytes Relative: 64 — ABNORMAL HIGH
Lymphs Abs: 6.8 — ABNORMAL HIGH
Monocytes Absolute: 0.7
Monocytes Relative: 6
Neutro Abs: 2.8
Neutrophils Relative %: 26 — ABNORMAL LOW

## 2010-10-15 LAB — POCT I-STAT CREATININE
Creatinine, Ser: 1
Operator id: 151321

## 2010-10-15 LAB — PATHOLOGIST SMEAR REVIEW

## 2010-10-15 LAB — B-NATRIURETIC PEPTIDE (CONVERTED LAB): Pro B Natriuretic peptide (BNP): <30

## 2010-10-15 LAB — LIPASE, BLOOD: Lipase: 35

## 2010-10-15 LAB — D-DIMER, QUANTITATIVE: D-Dimer, Quant: 0.28

## 2010-10-16 LAB — URINALYSIS, ROUTINE W REFLEX MICROSCOPIC
Bilirubin Urine: NEGATIVE
Glucose, UA: NEGATIVE mg/dL
Ketones, ur: NEGATIVE mg/dL
Nitrite: NEGATIVE
Protein, ur: NEGATIVE mg/dL
Specific Gravity, Urine: 1.014 (ref 1.005–1.030)
Urobilinogen, UA: 0.2 mg/dL (ref 0.0–1.0)
pH: 5.5 (ref 5.0–8.0)

## 2010-10-16 LAB — URINE MICROSCOPIC-ADD ON

## 2010-10-17 LAB — GLUCOSE, CAPILLARY
Glucose-Capillary: 198 mg/dL — ABNORMAL HIGH (ref 70–99)
Glucose-Capillary: 284 mg/dL — ABNORMAL HIGH (ref 70–99)
Glucose-Capillary: 139 mg/dL — ABNORMAL HIGH (ref 70–99)
Glucose-Capillary: 213 mg/dL — ABNORMAL HIGH (ref 70–99)
Glucose-Capillary: 229 mg/dL — ABNORMAL HIGH (ref 70–99)

## 2010-10-17 LAB — BASIC METABOLIC PANEL
BUN: 13 mg/dL (ref 6–23)
CO2: 20 mEq/L (ref 19–32)
Calcium: 9.2 mg/dL (ref 8.4–10.5)
Chloride: 104 mEq/L (ref 96–112)
Creatinine, Ser: 1.19 mg/dL — ABNORMAL HIGH (ref 0.50–1.10)
GFR calc Af Amer: 56 mL/min — ABNORMAL LOW (ref >60)
GFR calc non Af Amer: 46 mL/min — ABNORMAL LOW (ref >60)
Glucose, Bld: 221 mg/dL — ABNORMAL HIGH (ref 70–99)
Potassium: 3.6 mEq/L (ref 3.5–5.1)
Sodium: 136 mEq/L (ref 135–145)

## 2010-10-17 LAB — CBC
HCT: 26.4 % — ABNORMAL LOW (ref 36.0–46.0)
Hemoglobin: 8.8 g/dL — ABNORMAL LOW (ref 12.0–15.0)
MCH: 30.4 pg (ref 26.0–34.0)
MCHC: 33.3 g/dL (ref 30.0–36.0)
MCV: 91.3 fL (ref 78.0–100.0)
Platelets: 230 10*3/uL (ref 150–400)
RBC: 2.89 MIL/uL — ABNORMAL LOW (ref 3.87–5.11)
RDW: 13.7 % (ref 11.5–15.5)
WBC: 7.7 10*3/uL (ref 4.0–10.5)

## 2010-10-18 LAB — URINE CULTURE
Colony Count: NO GROWTH
Culture  Setup Time: 201209210118
Culture: NO GROWTH
Special Requests: NEGATIVE

## 2010-10-19 LAB — CULTURE, BLOOD (ROUTINE X 2)
Culture  Setup Time: 201209170858
Culture  Setup Time: 201209170858
Culture: NO GROWTH
Culture: NO GROWTH

## 2010-10-20 LAB — CULTURE, BLOOD (ROUTINE X 2)
Culture  Setup Time: 201209180137
Culture: NO GROWTH

## 2010-10-20 LAB — GLUCOSE, CAPILLARY: Glucose-Capillary: 181 mg/dL — ABNORMAL HIGH (ref 70–99)

## 2010-10-22 LAB — POCT CARDIAC MARKERS
CKMB, poc: 2.6
CKMB, poc: 2.6
Myoglobin, poc: 76.5
Myoglobin, poc: 85.5
Operator id: 277751
Operator id: 277751
Troponin i, poc: <0.05
Troponin i, poc: <0.05

## 2010-10-29 LAB — CBC
HCT: 31.5 — ABNORMAL LOW
HCT: 32.5 — ABNORMAL LOW
Hemoglobin: 10.9 — ABNORMAL LOW
Hemoglobin: 11.3 — ABNORMAL LOW
MCHC: 34.6
MCHC: 34.7
MCV: 91.8
MCV: 92.4
Platelets: 157
Platelets: 164
RBC: 3.44 — ABNORMAL LOW
RBC: 3.52 — ABNORMAL LOW
RDW: 13.9
RDW: 14.2
WBC: 8.7
WBC: 9.5

## 2010-10-29 LAB — POCT I-STAT 3, ART BLOOD GAS (G3+)
Acid-base deficit: 1
Bicarbonate: 23.7
O2 Saturation: 83
Patient temperature: 96.7
TCO2: 25
pCO2 arterial: 36.1
pH, Arterial: 7.422 — ABNORMAL HIGH
pO2, Arterial: 44 — ABNORMAL LOW

## 2010-10-29 LAB — POCT I-STAT, CHEM 8
BUN: 15
Calcium, Ion: 1.16
Chloride: 104
Creatinine, Ser: 0.9
Glucose, Bld: 292 — ABNORMAL HIGH
HCT: 37
Hemoglobin: 12.6
Potassium: 3.2 — ABNORMAL LOW
Sodium: 140
TCO2: 24

## 2010-10-29 LAB — CARDIAC PANEL(CRET KIN+CKTOT+MB+TROPI)
CK, MB: 4
CK, MB: 4.1 — ABNORMAL HIGH
Relative Index: 1.9
Relative Index: 2.2
Total CK: 186 — ABNORMAL HIGH
Total CK: 214 — ABNORMAL HIGH
Troponin I: 0.01
Troponin I: 0.01

## 2010-10-29 LAB — CULTURE, BLOOD (ROUTINE X 2)
Culture: NO GROWTH
Culture: NO GROWTH

## 2010-10-29 LAB — BASIC METABOLIC PANEL
BUN: 11
CO2: 25
Calcium: 8.8
Chloride: 107
Creatinine, Ser: 0.83
GFR calc Af Amer: >60
GFR calc non Af Amer: >60
Glucose, Bld: 225 — ABNORMAL HIGH
Potassium: 3.3 — ABNORMAL LOW
Sodium: 139

## 2010-10-29 LAB — GLUCOSE, CAPILLARY
Glucose-Capillary: 192 — ABNORMAL HIGH
Glucose-Capillary: 206 — ABNORMAL HIGH
Glucose-Capillary: 210 — ABNORMAL HIGH
Glucose-Capillary: 221 — ABNORMAL HIGH
Glucose-Capillary: 221 — ABNORMAL HIGH
Glucose-Capillary: 224 — ABNORMAL HIGH
Glucose-Capillary: 230 — ABNORMAL HIGH
Glucose-Capillary: 240 — ABNORMAL HIGH
Glucose-Capillary: 259 — ABNORMAL HIGH
Glucose-Capillary: 269 — ABNORMAL HIGH

## 2010-10-29 LAB — COMPREHENSIVE METABOLIC PANEL
ALT: 25
AST: 27
Albumin: 3.4 — ABNORMAL LOW
Alkaline Phosphatase: 85
BUN: 14
CO2: 25
Calcium: 8.9
Chloride: 105
Creatinine, Ser: 0.9
GFR calc Af Amer: >60
GFR calc non Af Amer: >60
Glucose, Bld: 237 — ABNORMAL HIGH
Potassium: 3.2 — ABNORMAL LOW
Sodium: 140
Total Bilirubin: 0.5
Total Protein: 6.3

## 2010-10-29 LAB — C-REACTIVE PROTEIN: CRP: 4.6 — ABNORMAL HIGH (ref <0.6)

## 2010-10-29 LAB — POCT CARDIAC MARKERS
CKMB, poc: 5.9
Myoglobin, poc: 122
Troponin i, poc: <0.05

## 2010-10-29 LAB — LIPID PANEL
Cholesterol: 137
HDL: 27 — ABNORMAL LOW
LDL Cholesterol: 36
Total CHOL/HDL Ratio: 5.1
Triglycerides: 368 — ABNORMAL HIGH
VLDL: 74 — ABNORMAL HIGH

## 2010-10-29 LAB — MAGNESIUM: Magnesium: 1.4 — ABNORMAL LOW

## 2010-10-29 LAB — PROTIME-INR
INR: 1.1
Prothrombin Time: 14

## 2010-10-29 LAB — HEMOGLOBIN A1C
Hgb A1c MFr Bld: 9.3 — ABNORMAL HIGH
Mean Plasma Glucose: 220

## 2010-10-29 LAB — HEPARIN LEVEL (UNFRACTIONATED)
Heparin Unfractionated: 0.22 — ABNORMAL LOW
Heparin Unfractionated: 0.27 — ABNORMAL LOW
Heparin Unfractionated: <0.1 — ABNORMAL LOW

## 2010-10-29 LAB — APTT: aPTT: 28

## 2010-11-05 NOTE — Discharge Summary (Signed)
Michelle Holmes, PRIMMER                  ACCOUNT NO.:  [id number]  MEDICAL RECORD NO.:  [id number]  LOCATION:  [id number]                         FACILITY:  [facility]  PHYSICIAN:  Allegra Lai. Terrence Dupont, M.D. DATE OF BIRTH:  [birth year]  DATE OF ADMISSION:  10/12/2010 DATE OF DISCHARGE:  10/17/2010                              DISCHARGE SUMMARY   ADMITTING DIAGNOSES:  Uncontrolled diabetes mellitus, urinary tract infection, acute on chronic anemia, rule out gastrointestinal loss, coronary artery disease, hypertension, morbid obesity, obstructive sleep apnea, obesity hypoventilation syndrome, history of transient ischemic attack in the past, degenerative joint disease, scoliosis, mild renal insufficiency and history of congestive heart failure secondary to diastolic dysfunction.  DISCHARGE DIAGNOSES:  Resolving Escherichia coli urinary tract infection, status post uncontrolled diabetes mellitus, chronic anemia stable, coronary artery disease stable, hypertension, hypercholesteremia, morbid obesity, obstructive sleep apnea, obesity hypoventilation syndrome, history of transient ischemic attack in the past, degenerative joint disease, scoliosis, status post bronchitis, history of congestive heart failure secondary to diastolic dysfunction in the past, status post mild renal insufficiency and depression.  DISCHARGE HOME MEDICATIONS: 1. Amitriptyline 25 mg 1 tablet daily at night. 2. Ciprofloxacin 500 mg 1 tablet twice daily for 5 days. 3. Feosol 325 mg 1 tablet twice daily. 4. Lantus insulin. 5. Lantus SoloSTAR pen 40 units twice daily. 6. Metformin XR 500 mg 1 tablet twice daily. 7. Metoprolol 25 mg 1 tablet twice daily. 8. Pepcid 20 mg 1 tablet twice daily. 9. Advair Diskus 1 puff 100/50 twice daily. 10.Enteric-coated aspirin 81 mg 1 tablet daily. 11.Crestor 20 mg 1 tablet daily. 12.Plavix 75 mg 1 tablet daily.  The patient has been advised to monitor blood pressure and blood sugar daily and  chart.  DIET:  Low salt, low cholesterol 1800 calories ADA diet.  The patient has been advised to reduce weight.  CONDITION AT DISCHARGE:  Stable.  FOLLOWUP:  With me in 1 week.  BRIEF HISTORY AND HOSPITAL COURSE:  Michelle Holmes is a 60 year old black female with past medical history significant for coronary artery disease, status post PTCA stenting to LAD in the past, hypertension, history of CHF secondary to diastolic dysfunction, history of TIA, hypertension, insulin-requiring diabetes mellitus, morbid obesity, hypercholesteremia, obstructive sleep apnea, obesity hypoventilation syndrome, history of degenerative joint disease, scoliosis, history of CA of colon in the past and remote tobacco abuse.  She came to the ER complaining of generalized weakness, fatigue and frequency of urination and dysuria and was noted to have blood sugar and stating blood sugar is staying above 400.  The patient under lot of stress as her daughter passed away 1 week ago.  States her appetite has been poor and not eating for last 4-5 days.  Denies any chest pain, nausea, vomiting and diaphoresis.  Denies PND, orthopnea or leg swelling.  Denies cough, fever or chills.  Denies bright red blood per rectum or melena.  Denies weakness in the arms or legs.  Denies slurred speech.  PAST MEDICAL HISTORY:  As above.  PAST SURGICAL HISTORY:  She had partial colectomy in the past, had hysterectomy in the past and had appendectomy in the past.  SOCIAL HISTORY:  She is widowed.  Four children.  Smoked one-pack per day for 5 years, quit in 1980s.  No history of alcohol abuse.  [place] in [place]  FAMILY HISTORY:  Negative for coronary artery disease.  ALLERGIES:  She is allergic to ACE INHIBITORS and PENICILLIN.  PHYSICAL EXAMINATION:  GENERAL:  She is alert, awake and oriented x3, in no acute distress. VITAL SIGNS:  Blood pressure was 126/84, pulse was 84 regular and temperature was 100.6. EYES:  Conjunctivae  was pink. NECK:  Supple, no JVD. LUNGS:  Clear to auscultation. CARDIOVASCULAR:  S1 and S2 was normal.  There was soft systolic murmur. ABDOMEN:  Soft.  Bowel sounds were present, obese and nontender. EXTREMITIES:  There is no clubbing, cyanosis or edema.  LABORATORY DATA:  Hemoglobin was 9.6, hematocrit 27.7, white count of 11.7, glucose was 503, BUN 23, creatinine 1.57, potassium was 3.8. Repeat hemoglobin after hydration was 8.6, hematocrit 25.2, white count of 9.8.  Stool for occult blood was negative.  Urine culture grew gram- negative.  E. coil which was sensitive to ciprofloxacin.  Repeat labs today; hemoglobin is 8.8, hematocrit 26.4, white count of 7.7.  Sodium is 136, potassium 3.6, BUN 13, creatinine 1.19, glucose is 221.  Blood cultures were negative.  BRIEF HOSPITAL COURSE:  The patient was admitted to Telemetry Unit.  The patient was started on broad-spectrum antibiotics and cultures were obtained.  Blood cultures are negative so far.  The patient did spike temperature to 101.  Her ciprofloxacin was changed to vancomycin and Primaxin.  Final cultures showed E-coli which was sensitive to ciprofloxacin, vancomycin and Primaxin has been discontinued and switched to p.o. ciprofloxacin.  Her Lantus insulin dose has been adjusted.  Her blood sugar is fairly well-controlled.  We will add metformin to her regimen and the patient has been advised to monitor her blood sugar closely.  The patient will be discharged home on above medications and will be followed up in my office in 1 week.  The patient has been advised to chart her blood pressure and blood sugar and bring to the office next week.    Allegra Lai. Terrence Dupont, M.D.    MNH/MEDQ  D:  10/17/2010  T:  10/18/2010  Job:  [id number]  Electronically Signed by Charolette Forward M.D. on 11/05/2010 07:50:25 PM

## 2010-11-11 LAB — COMPREHENSIVE METABOLIC PANEL
ALT: 13
AST: 23
Albumin: 3.7
Alkaline Phosphatase: 70
BUN: 11
CO2: 25
Calcium: 8.7
Chloride: 107
Creatinine, Ser: 0.94
GFR calc Af Amer: >60
GFR calc non Af Amer: >60
Glucose, Bld: 162 — ABNORMAL HIGH
Potassium: 3.2 — ABNORMAL LOW
Sodium: 139
Total Bilirubin: 0.7
Total Protein: 6.6

## 2010-11-11 LAB — CBC
HCT: 33.8 — ABNORMAL LOW
Hemoglobin: 11.7 — ABNORMAL LOW
MCHC: 34.5
MCV: 91.5
Platelets: 208
RBC: 3.7 — ABNORMAL LOW
RDW: 14
WBC: 11.4 — ABNORMAL HIGH

## 2010-11-11 LAB — DIFFERENTIAL
Basophils Absolute: 0.1
Basophils Relative: 1
Eosinophils Absolute: 0.3
Eosinophils Relative: 3
Lymphocytes Relative: 56 — ABNORMAL HIGH
Lymphs Abs: 6.4 — ABNORMAL HIGH
Monocytes Absolute: 0.6
Monocytes Relative: 5
Neutro Abs: 4
Neutrophils Relative %: 35 — ABNORMAL LOW

## 2010-11-11 LAB — POCT CARDIAC MARKERS
CKMB, poc: 4.5
CKMB, poc: 5.8
Myoglobin, poc: 129
Myoglobin, poc: 98.7
Operator id: 2725
Operator id: 2725
Troponin i, poc: <0.05
Troponin i, poc: <0.05

## 2010-11-11 LAB — B-NATRIURETIC PEPTIDE (CONVERTED LAB): Pro B Natriuretic peptide (BNP): <30

## 2010-11-11 LAB — PATHOLOGIST SMEAR REVIEW

## 2010-12-01 ENCOUNTER — Inpatient Hospital Stay (HOSPITAL_COMMUNITY)
Admission: EM | Admit: 2010-12-01 | Discharge: 2010-12-15 | DRG: 602 | Disposition: A | Discharge status: Home / Self Care | Payer: No Typology Code available for payment source | Attending: Cardiology | Admitting: Cardiology

## 2010-12-01 ENCOUNTER — Emergency Department (HOSPITAL_COMMUNITY): Payer: No Typology Code available for payment source

## 2010-12-01 ENCOUNTER — Encounter (HOSPITAL_COMMUNITY): Payer: Self-pay | Admitting: *Deleted

## 2010-12-01 ENCOUNTER — Other Ambulatory Visit: Payer: Self-pay

## 2010-12-01 DIAGNOSIS — Z794 Long term (current) use of insulin: Secondary | ICD-10-CM

## 2010-12-01 DIAGNOSIS — Z9861 Coronary angioplasty status: Secondary | ICD-10-CM

## 2010-12-01 DIAGNOSIS — D649 Anemia, unspecified: Secondary | ICD-10-CM | POA: Diagnosis present

## 2010-12-01 DIAGNOSIS — J4489 Other specified chronic obstructive pulmonary disease: Secondary | ICD-10-CM | POA: Diagnosis present

## 2010-12-01 DIAGNOSIS — E1100 Type 2 diabetes mellitus with hyperosmolarity without nonketotic hyperglycemic-hyperosmolar coma (NKHHC): Secondary | ICD-10-CM | POA: Diagnosis present

## 2010-12-01 DIAGNOSIS — N760 Acute vaginitis: Secondary | ICD-10-CM | POA: Diagnosis present

## 2010-12-01 DIAGNOSIS — Z6841 Body Mass Index (BMI) 40.0 and over, adult: Secondary | ICD-10-CM

## 2010-12-01 DIAGNOSIS — Z79899 Other long term (current) drug therapy: Secondary | ICD-10-CM

## 2010-12-01 DIAGNOSIS — Z85038 Personal history of other malignant neoplasm of large intestine: Secondary | ICD-10-CM

## 2010-12-01 DIAGNOSIS — I129 Hypertensive chronic kidney disease with stage 1 through stage 4 chronic kidney disease, or unspecified chronic kidney disease: Secondary | ICD-10-CM | POA: Diagnosis present

## 2010-12-01 DIAGNOSIS — E8809 Other disorders of plasma-protein metabolism, not elsewhere classified: Secondary | ICD-10-CM | POA: Diagnosis present

## 2010-12-01 DIAGNOSIS — E7800 Pure hypercholesterolemia, unspecified: Secondary | ICD-10-CM | POA: Diagnosis present

## 2010-12-01 DIAGNOSIS — N189 Chronic kidney disease, unspecified: Secondary | ICD-10-CM | POA: Diagnosis present

## 2010-12-01 DIAGNOSIS — E118 Type 2 diabetes mellitus with unspecified complications: Secondary | ICD-10-CM | POA: Diagnosis present

## 2010-12-01 DIAGNOSIS — E662 Morbid (severe) obesity with alveolar hypoventilation: Secondary | ICD-10-CM | POA: Diagnosis present

## 2010-12-01 DIAGNOSIS — E876 Hypokalemia: Secondary | ICD-10-CM | POA: Diagnosis not present

## 2010-12-01 DIAGNOSIS — N762 Acute vulvitis: Secondary | ICD-10-CM

## 2010-12-01 DIAGNOSIS — Z9989 Dependence on other enabling machines and devices: Secondary | ICD-10-CM | POA: Diagnosis present

## 2010-12-01 DIAGNOSIS — R000 Tachycardia, unspecified: Secondary | ICD-10-CM | POA: Diagnosis present

## 2010-12-01 DIAGNOSIS — I5032 Chronic diastolic (congestive) heart failure: Secondary | ICD-10-CM | POA: Diagnosis present

## 2010-12-01 DIAGNOSIS — Z880 Allergy status to penicillin: Secondary | ICD-10-CM

## 2010-12-01 DIAGNOSIS — Z87891 Personal history of nicotine dependence: Secondary | ICD-10-CM

## 2010-12-01 DIAGNOSIS — M4120 Other idiopathic scoliosis, site unspecified: Secondary | ICD-10-CM | POA: Diagnosis present

## 2010-12-01 DIAGNOSIS — R739 Hyperglycemia, unspecified: Secondary | ICD-10-CM

## 2010-12-01 DIAGNOSIS — L02219 Cutaneous abscess of trunk, unspecified: Principal | ICD-10-CM | POA: Diagnosis present

## 2010-12-01 DIAGNOSIS — I2510 Atherosclerotic heart disease of native coronary artery without angina pectoris: Secondary | ICD-10-CM | POA: Diagnosis present

## 2010-12-01 DIAGNOSIS — Z7982 Long term (current) use of aspirin: Secondary | ICD-10-CM

## 2010-12-01 DIAGNOSIS — I509 Heart failure, unspecified: Secondary | ICD-10-CM | POA: Diagnosis present

## 2010-12-01 DIAGNOSIS — Z8673 Personal history of transient ischemic attack (TIA), and cerebral infarction without residual deficits: Secondary | ICD-10-CM

## 2010-12-01 DIAGNOSIS — L03319 Cellulitis of trunk, unspecified: Secondary | ICD-10-CM | POA: Diagnosis present

## 2010-12-01 DIAGNOSIS — J449 Chronic obstructive pulmonary disease, unspecified: Secondary | ICD-10-CM | POA: Diagnosis present

## 2010-12-01 DIAGNOSIS — G4733 Obstructive sleep apnea (adult) (pediatric): Secondary | ICD-10-CM | POA: Diagnosis present

## 2010-12-01 DIAGNOSIS — M1990 Unspecified osteoarthritis, unspecified site: Secondary | ICD-10-CM | POA: Diagnosis present

## 2010-12-01 HISTORY — DX: Chronic obstructive pulmonary disease, unspecified: J44.9

## 2010-12-01 HISTORY — DX: Atherosclerotic heart disease of native coronary artery without angina pectoris: I25.10

## 2010-12-01 HISTORY — DX: Cerebral infarction, unspecified: I63.9

## 2010-12-01 HISTORY — DX: Pure hypercholesterolemia, unspecified: E78.00

## 2010-12-01 HISTORY — DX: Essential (primary) hypertension: I10

## 2010-12-01 LAB — CBC
HCT: 29.3 % — ABNORMAL LOW (ref 36.0–46.0)
Hemoglobin: 10.2 g/dL — ABNORMAL LOW (ref 12.0–15.0)
MCH: 31 pg (ref 26.0–34.0)
MCHC: 34.8 g/dL (ref 30.0–36.0)
MCV: 89.1 fL (ref 78.0–100.0)
Platelets: 153 10*3/uL (ref 150–400)
RBC: 3.29 MIL/uL — ABNORMAL LOW (ref 3.87–5.11)
RDW: 14.6 % (ref 11.5–15.5)
WBC: 11.7 10*3/uL — ABNORMAL HIGH (ref 4.0–10.5)

## 2010-12-01 LAB — COMPREHENSIVE METABOLIC PANEL
ALT: 11 U/L (ref 0–35)
AST: 8 U/L (ref 0–37)
Albumin: 3.1 g/dL — ABNORMAL LOW (ref 3.5–5.2)
Alkaline Phosphatase: 118 U/L — ABNORMAL HIGH (ref 39–117)
BUN: 14 mg/dL (ref 6–23)
CO2: 23 mEq/L (ref 19–32)
Calcium: 9.7 mg/dL (ref 8.4–10.5)
Chloride: 98 mEq/L (ref 96–112)
Creatinine, Ser: 1.32 mg/dL — ABNORMAL HIGH (ref 0.50–1.10)
GFR calc Af Amer: 50 mL/min — ABNORMAL LOW (ref >90)
GFR calc non Af Amer: 43 mL/min — ABNORMAL LOW (ref >90)
Glucose, Bld: 479 mg/dL — ABNORMAL HIGH (ref 70–99)
Potassium: 3.5 mEq/L (ref 3.5–5.1)
Sodium: 134 mEq/L — ABNORMAL LOW (ref 135–145)
Total Bilirubin: 0.6 mg/dL (ref 0.3–1.2)
Total Protein: 7.7 g/dL (ref 6.0–8.3)

## 2010-12-01 LAB — GLUCOSE, CAPILLARY: Glucose-Capillary: 455 mg/dL — ABNORMAL HIGH (ref 70–99)

## 2010-12-01 LAB — URINALYSIS, ROUTINE W REFLEX MICROSCOPIC
Bilirubin Urine: NEGATIVE
Glucose, UA: >1000 mg/dL — AB
Leukocytes, UA: NEGATIVE
Nitrite: NEGATIVE
Protein, ur: 100 mg/dL — AB
Specific Gravity, Urine: 1.035 — ABNORMAL HIGH (ref 1.005–1.030)
Urobilinogen, UA: 0.2 mg/dL (ref 0.0–1.0)
pH: 6 (ref 5.0–8.0)

## 2010-12-01 LAB — DIFFERENTIAL
Basophils Absolute: 0 10*3/uL (ref 0.0–0.1)
Basophils Relative: 0 % (ref 0–1)
Eosinophils Absolute: 0 10*3/uL (ref 0.0–0.7)
Eosinophils Relative: 0 % (ref 0–5)
Lymphocytes Relative: 9 % — ABNORMAL LOW (ref 12–46)
Lymphs Abs: 1.1 10*3/uL (ref 0.7–4.0)
Monocytes Absolute: 1 10*3/uL (ref 0.1–1.0)
Monocytes Relative: 9 % (ref 3–12)
Neutro Abs: 9.6 10*3/uL — ABNORMAL HIGH (ref 1.7–7.7)
Neutrophils Relative %: 82 % — ABNORMAL HIGH (ref 43–77)

## 2010-12-01 LAB — CARDIAC PANEL(CRET KIN+CKTOT+MB+TROPI)
CK, MB: 2.7 ng/mL (ref 0.3–4.0)
Relative Index: 1.8 (ref 0.0–2.5)
Total CK: 146 U/L (ref 7–177)
Troponin I: <0.3 ng/mL (ref <0.30)

## 2010-12-01 LAB — URINE MICROSCOPIC-ADD ON

## 2010-12-01 MED ORDER — INSULIN ASPART 100 UNIT/ML ~~LOC~~ SOLN: 12.0000 [IU] | Freq: Once | SUBCUTANEOUS | Status: DC

## 2010-12-01 MED ORDER — MOXIFLOXACIN HCL IN NACL 400 MG/250ML IV SOLN
INTRAVENOUS | Status: AC
  Filled 2010-12-01: qty 250

## 2010-12-01 MED ORDER — MOXIFLOXACIN HCL IN NACL 400 MG/250ML IV SOLN
400.0000 mg | Freq: Once | INTRAVENOUS | Status: AC
  Administered 2010-12-01 (×2): 400 mg via INTRAVENOUS

## 2010-12-01 MED ORDER — SODIUM CHLORIDE 0.9 % IV BOLUS (SEPSIS)
1000.0000 mL | Freq: Once | INTRAVENOUS | Status: AC
  Administered 2010-12-01: 1000 mL via INTRAVENOUS

## 2010-12-01 MED ORDER — INSULIN REGULAR HUMAN 100 UNIT/ML IJ SOLN
12.0000 [IU] | Freq: Once | INTRAMUSCULAR | Status: DC
  Administered 2010-12-01: 12 [IU] via SUBCUTANEOUS

## 2010-12-01 MED ORDER — INSULIN ASPART 100 UNIT/ML ~~LOC~~ SOLN
0.0000 [IU] | Freq: Three times a day (TID) | SUBCUTANEOUS | Status: DC
  Administered 2010-12-02: 9 [IU] via SUBCUTANEOUS
  Administered 2010-12-02 – 2010-12-03 (×3): 7 [IU] via SUBCUTANEOUS
  Administered 2010-12-03: 5 [IU] via SUBCUTANEOUS
  Administered 2010-12-04 – 2010-12-05 (×4): 3 [IU] via SUBCUTANEOUS
  Administered 2010-12-05: 2 [IU] via SUBCUTANEOUS
  Administered 2010-12-05: 3 [IU] via SUBCUTANEOUS
  Administered 2010-12-06: 2 [IU] via SUBCUTANEOUS
  Administered 2010-12-06 – 2010-12-07 (×3): 1 [IU] via SUBCUTANEOUS
  Administered 2010-12-07: 0 [IU] via SUBCUTANEOUS
  Administered 2010-12-07 – 2010-12-08 (×2): 1 [IU] via SUBCUTANEOUS
  Administered 2010-12-09: 3 [IU] via SUBCUTANEOUS
  Administered 2010-12-10 – 2010-12-11 (×2): 2 [IU] via SUBCUTANEOUS
  Administered 2010-12-12 – 2010-12-15 (×5): 1 [IU] via SUBCUTANEOUS
  Filled 2010-12-01 (×2): qty 3

## 2010-12-01 MED ORDER — INSULIN ASPART 100 UNIT/ML ~~LOC~~ SOLN
SUBCUTANEOUS | Status: AC
  Administered 2010-12-01: 12 [IU] via SUBCUTANEOUS
  Filled 2010-12-01: qty 3

## 2010-12-01 MED ORDER — INSULIN ASPART 100 UNIT/ML ~~LOC~~ SOLN
12.0000 [IU] | Freq: Once | SUBCUTANEOUS | Status: AC
  Administered 2010-12-01: 12 [IU] via SUBCUTANEOUS

## 2010-12-01 MED ORDER — MOXIFLOXACIN HCL IN NACL 400 MG/250ML IV SOLN
400.0000 mg | Freq: Every day | INTRAVENOUS | Status: DC
  Filled 2010-12-01 (×2): qty 250

## 2010-12-01 MED ORDER — INSULIN REGULAR HUMAN 100 UNIT/ML IJ SOLN: 12.0000 [IU] | Freq: Once | INTRAMUSCULAR | Status: DC

## 2010-12-01 MED ORDER — INSULIN ASPART 100 UNIT/ML ~~LOC~~ SOLN
SUBCUTANEOUS | Status: AC
  Administered 2010-12-01: 21:00:00
  Filled 2010-12-01: qty 3

## 2010-12-01 MED ORDER — CLINDAMYCIN PHOSPHATE 600 MG/50ML IV SOLN
600.0000 mg | Freq: Once | INTRAVENOUS | Status: AC
  Administered 2010-12-01: 600 mg via INTRAVENOUS
  Filled 2010-12-01: qty 50

## 2010-12-01 MED ORDER — ACETAMINOPHEN 325 MG PO TABS
650.0000 mg | ORAL_TABLET | Freq: Once | ORAL | Status: AC
  Administered 2010-12-01: 650 mg via ORAL
  Filled 2010-12-01: qty 2

## 2010-12-01 MED ORDER — ONDANSETRON HCL 4 MG/2ML IJ SOLN
4.0000 mg | Freq: Once | INTRAMUSCULAR | Status: AC
  Administered 2010-12-01: 4 mg via INTRAVENOUS
  Filled 2010-12-01: qty 2

## 2010-12-01 MED ORDER — SODIUM CHLORIDE 0.9 % IV SOLN
Freq: Once | INTRAVENOUS | Status: AC
  Administered 2010-12-01: 21:00:00 via INTRAVENOUS

## 2010-12-01 NOTE — ED Notes (Signed)
Dr. Thad Ranger at the bedside

## 2010-12-01 NOTE — H&P (Signed)
Michelle Holmes is an 60 y.o. female.   Chief Complaint: Generalized malaise fever chills Uncontrolled blood sugar. HPI: Patient is 60 year old female with past medical history significant for multiple medical problems are equal blood artery disease history of PTCA stenting to LAD in the past hypertension insulin requiring diabetes mellitus obstructive sleep apnea morbid obesity history of TIA in the past scoliosis history of congestive heart failure secondary to diastolic dysfunction chronic anemia mild renal insufficiency history of CA of colon remote history of tobacco abuse came to the ER complaining of generalized smell is and fever for last 2 days. Patient also states that her blood sugar has been running higher for last few days. Patient denies any chest pain nausea or vomiting or diaphoresis. Denies any history of PND orthopnea or leg swelling. Denies palpitation lightheadedness or syncope. In the ER patient was noted to have a high-grade fever with blood sugar of more than 450 received the almost 2 L of IV fluids and 12 units of insulin without improvement in her blood sugar. Patient was also noted to be tachycardic with heart rate of 120s.  Past Medical History  Diagnosis Date  . Emphysema   . Colon cancer   . Hypertension   . Hypercholesteremia   . Diabetes mellitus   . COPD (chronic obstructive pulmonary disease)   . Coronary artery disease   . Stroke    Past surgical history. Status post C-section Status post partial colectomy Status post appendectomy Status post hysterectomy.   No family history on file. Social History:  reports that she has quit smoking. She does not have any smokeless tobacco history on file. She reports that she does not drink alcohol or use illicit drugs.  Allergies:  Allergies  Allergen Reactions  . Penicillins Hives   allergies penicillin she gets hives  ACE inhibitors                                                       angioedema.  Medications  Prior to Admission  Medication Dose Route Frequency Provider Last Rate Last Dose  . 0.9 %  sodium chloride infusion   Intravenous Once Lezlie Octave, MD 125 mL/hr at 12/01/10 2100    . acetaminophen (TYLENOL) tablet 650 mg  650 mg Oral Once Lezlie Octave, MD   650 mg at 12/01/10 1615  . clindamycin (CLEOCIN) IVPB 600 mg  600 mg Intravenous Once Lezlie Octave, MD   600 mg at 12/01/10 1947  . insulin aspart (novoLOG) 100 UNIT/ML injection        12 Units at 12/01/10 1945  . insulin aspart (novoLOG) 100 UNIT/ML injection           . insulin aspart (novoLOG) injection 12 Units  12 Units Subcutaneous Once Leighton Parody, PHARMD   12 Units at 12/01/10 1946  . insulin regular (HUMULIN R,NOVOLIN R) 100 units/mL injection 12 Units  12 Units Subcutaneous Once Lezlie Octave, MD   12 Units at 12/01/10 2100  . moxifloxacin (AVELOX) IVPB 400 mg  400 mg Intravenous Once Clent Demark, MD      . ondansetron [facility]) injection 4 mg  4 mg Intravenous Once Lezlie Octave, MD   4 mg at 12/01/10 1615  . sodium chloride 0.9 % bolus 1,000 mL  1,000 mL Intravenous  Once Lezlie Octave, MD   1,000 mL at 12/01/10 1615  . sodium chloride 0.9 % bolus 1,000 mL  1,000 mL Intravenous Once Lezlie Octave, MD   1,000 mL at 12/01/10 1800  . DISCONTD: insulin regular (HUMULIN R,NOVOLIN R) 100 units/mL injection 12 Units  12 Units Subcutaneous Once Lezlie Octave, MD       No current outpatient prescriptions on file as of 12/01/2010.   home medications. Enteric-coated aspirin 81 mg by mouth daily Plavix 75 mg 1 tablet daily Toprol XL 24 mg twice daily Crestor 10 mg daily Losartan 100 mg daily Pepcid 20 mg twice a day Lantus insulin 32 units twice daily Fields all one tablet twice daily Amitriptyline 25 mg daily Advair Diskus 250/50 twice a day  Results for orders placed during the hospital encounter of 12/01/10 (from the past 48 hour(s))  CBC     Status: Abnormal   Collection Time   12/01/10   4:30 PM      Component Value Range Comment   WBC 11.7 (*) 4.0 - 10.5 (K/uL)    RBC 3.29 (*) 3.87 - 5.11 (MIL/uL)    Hemoglobin 10.2 (*) 12.0 - 15.0 (g/dL)    HCT 29.3 (*) 36.0 - 46.0 (%)    MCV 89.1  78.0 - 100.0 (fL)    MCH 31.0  26.0 - 34.0 (pg)    MCHC 34.8  30.0 - 36.0 (g/dL)    RDW 14.6  11.5 - 15.5 (%)    Platelets 153  150 - 400 (K/uL)   DIFFERENTIAL     Status: Abnormal   Collection Time   12/01/10  4:30 PM      Component Value Range Comment   Neutrophils Relative 82 (*) 43 - 77 (%)    Neutro Abs 9.6 (*) 1.7 - 7.7 (K/uL)    Lymphocytes Relative 9 (*) 12 - 46 (%)    Lymphs Abs 1.1  0.7 - 4.0 (K/uL)    Monocytes Relative 9  3 - 12 (%)    Monocytes Absolute 1.0  0.1 - 1.0 (K/uL)    Eosinophils Relative 0  0 - 5 (%)    Eosinophils Absolute 0.0  0.0 - 0.7 (K/uL)    Basophils Relative 0  0 - 1 (%)    Basophils Absolute 0.0  0.0 - 0.1 (K/uL)   COMPREHENSIVE METABOLIC PANEL     Status: Abnormal   Collection Time   12/01/10  4:30 PM      Component Value Range Comment   Sodium 134 (*) 135 - 145 (mEq/L)    Potassium 3.5  3.5 - 5.1 (mEq/L)    Chloride 98  96 - 112 (mEq/L)    CO2 23  19 - 32 (mEq/L)    Glucose, Bld 479 (*) 70 - 99 (mg/dL)    BUN 14  6 - 23 (mg/dL)    Creatinine, Ser 1.32 (*) 0.50 - 1.10 (mg/dL)    Calcium 9.7  8.4 - 10.5 (mg/dL)    Total Protein 7.7  6.0 - 8.3 (g/dL)    Albumin 3.1 (*) 3.5 - 5.2 (g/dL)    AST 8  0 - 37 (U/L)    ALT 11  0 - 35 (U/L)    Alkaline Phosphatase 118 (*) 39 - 117 (U/L)    Total Bilirubin 0.6  0.3 - 1.2 (mg/dL)    GFR calc non Af Amer 43 (*) >90 (mL/min)    GFR calc Af Amer 50 (*) >90 (mL/min)  CARDIAC PANEL(CRET KIN+CKTOT+MB+TROPI)     Status: Normal   Collection Time   12/01/10  4:30 PM      Component Value Range Comment   Total CK 146  7 - 177 (U/L)    CK, MB 2.7  0.3 - 4.0 (ng/mL)    Troponin I <0.30  <0.30 (ng/mL)    Relative Index 1.8  0.0 - 2.5    URINALYSIS, ROUTINE W REFLEX MICROSCOPIC     Status: Abnormal   Collection  Time   12/01/10  4:36 PM      Component Value Range Comment   Color, Urine YELLOW  YELLOW     Appearance CLOUDY (*) CLEAR     Specific Gravity, Urine 1.035 (*) 1.005 - 1.030     pH 6.0  5.0 - 8.0     Glucose, UA >1000 (*) NEGATIVE (mg/dL)    Hgb urine dipstick SMALL (*) NEGATIVE     Bilirubin Urine NEGATIVE  NEGATIVE     Ketones, ur TRACE (*) NEGATIVE (mg/dL)    Protein, ur 100 (*) NEGATIVE (mg/dL)    Urobilinogen, UA 0.2  0.0 - 1.0 (mg/dL)    Nitrite NEGATIVE  NEGATIVE     Leukocytes, UA NEGATIVE  NEGATIVE    URINE MICROSCOPIC-ADD ON     Status: Abnormal   Collection Time   12/01/10  4:36 PM      Component Value Range Comment   Squamous Epithelial / LPF FEW (*) RARE     WBC, UA 0-2  <3 (WBC/hpf)    RBC / HPF 3-6  <3 (RBC/hpf)    Bacteria, UA FEW (*) RARE     Urine-Other MUCOUS PRESENT     GLUCOSE, CAPILLARY     Status: Abnormal   Collection Time   12/01/10  8:38 PM      Component Value Range Comment   Glucose-Capillary 455 (*) 70 - 99 (mg/dL)    Dg Chest 2 View  12/01/2010  *RADIOLOGY REPORT*  Clinical Data: Chest pain, fever and weakness.  CHEST - 2 VIEW  Comparison: Chest x-ray 10/15/2010.  Findings: The heart is enlarged but stable.  There is marked scoliosis.  Mild central vascular congestion but no definite edema, infiltrates or effusions.  IMPRESSION: Mild central vascular congestion without overt edema, infiltrates or effusions.  Original Report Authenticated By: P. Kalman Jewels, M.D.    Review of Systems  Constitutional: Positive for fever, chills and malaise/fatigue.  Respiratory: Negative for cough and wheezing.   Cardiovascular: Negative for chest pain, palpitations, leg swelling and PND.  Gastrointestinal: Positive for nausea. Negative for abdominal pain.  Genitourinary: Negative for dysuria, urgency and frequency.  Musculoskeletal: Positive for myalgias.  Skin: Positive for rash.    Blood pressure 139/59, pulse 97, temperature 100.5 F (38.1 C), temperature  source Oral, resp. rate 16, SpO2 98.00%. Physical Exam  Constitutional: She is oriented to person, place, and time. She appears well-developed and well-nourished.  HENT:  Head: Normocephalic and atraumatic.  Eyes: Pupils are equal, round, and reactive to light.  Neck: No JVD present. No thyromegaly present.  Cardiovascular: Regular rhythm and normal heart sounds.  Exam reveals no friction rub.   No murmur heard. Respiratory: Breath sounds normal. She has no wheezes. She has no rales.  GI: Soft. Bowel sounds are normal. She exhibits no distension. There is no tenderness.  Genitourinary: There is rash on the right labia. There is rash on the left labia.  Musculoskeletal: She exhibits no edema.  Lymphadenopathy:  She has no cervical adenopathy.  Neurological: She is alert and oriented to person, place, and time.     Assessment/Plan Cellulitis of pubic area and bilateral lead via. Uncontrolled diabetes mellitus Coronary artery disease stable history of PTCA stenting to the LAD in the past Hypertension Morbid obesity Obstructive sleep apnea now basically hyperventilation syndrome History of TIA in the past Scoliosis History of CHF secondary to diastolic dysfunction Degenerative joint disease Chronic anemia Mild renal insufficiency Plan signed as per orders  Keondria Siever N 12/01/2010, 10:10 PM

## 2010-12-01 NOTE — ED Notes (Signed)
Cardiac panel already drawn and sent to the lab. Order is an add on. Ordered per Thad Ranger, MD.

## 2010-12-01 NOTE — ED Notes (Signed)
Pt in via ems from home. C/o flu-like symptoms x3 days. N/v/d/cold chills/fever. Hypertensive with ems, unable to keep meds down. Hx of CVA with R sided deficits.

## 2010-12-01 NOTE — ED Provider Notes (Addendum)
History     CSN: [id number] Arrival date & time: 12/01/2010  3:16 PM   First MD Initiated Contact with Patient 12/01/10 1544      Chief Complaint  Patient presents with  . Influenza    (Consider location/radiation/quality/duration/timing/severity/associated sxs/prior treatment) HPI Comments: Patient presents with 2 days of malaise and myalgias.  She complains of intermittent nausea and vomiting over the last 2 days.  It is nonbloody non-coffee ground emesis.  She has had minimal diarrhea.  No specific abdominal pain.  No urinary symptoms.  Denies any new coughing, shortness of breath, chest pain.  Patient is on 2 L of oxygen by nasal cannula at home at baseline for COPD.  She's also had fevers at home as high as 101.  Patient is a 60 y.o. female presenting with flu symptoms. The history is provided by the patient and a relative. No language interpreter was used.  Influenza This is a new problem. The current episode started yesterday. The problem has not changed since onset.Pertinent negatives include no chest pain, no abdominal pain, no headaches and no shortness of breath. The symptoms are aggravated by nothing. The symptoms are relieved by nothing. She has tried nothing for the symptoms.    Past Medical History  Diagnosis Date  . Emphysema   . Colon cancer   . Hypertension   . Hypercholesteremia   . Diabetes mellitus   . COPD (chronic obstructive pulmonary disease)   . Coronary artery disease   . Stroke     History reviewed. No pertinent past surgical history.  No family history on file.  History  Substance Use Topics  . Smoking status: Former Research scientist (life sciences)  . Smokeless tobacco: Not on file  . Alcohol Use: No    OB History    Grav Para Term Preterm Abortions TAB SAB Ect Mult Living                  Review of Systems  Constitutional: Positive for fever, chills, diaphoresis and fatigue.  HENT: Negative.   Eyes: Negative.  Negative for discharge and redness.    Respiratory: Negative.  Negative for cough and shortness of breath.   Cardiovascular: Negative.  Negative for chest pain.  Gastrointestinal: Positive for nausea, vomiting and diarrhea. Negative for abdominal pain and blood in stool.  Genitourinary: Negative.  Negative for dysuria and vaginal discharge.  Musculoskeletal: Positive for myalgias. Negative for back pain.  Skin: Negative.  Negative for color change and rash.  Neurological: Negative.  Negative for syncope and headaches.  Hematological: Negative.  Negative for adenopathy.  Psychiatric/Behavioral: Negative.  Negative for confusion.  All other systems reviewed and are negative.    Allergies  Penicillins  Home Medications   Current Outpatient Rx  Name Route Sig Dispense Refill  . ASPIRIN 81 MG PO TABS Oral Take 81 mg by mouth daily.      Marland Kitchen CLOPIDOGREL BISULFATE 75 MG PO TABS Oral Take 75 mg by mouth daily.      Marland Kitchen LOSARTAN POTASSIUM 100 MG PO TABS Oral Take 100 mg by mouth daily.      Marland Kitchen ROSUVASTATIN CALCIUM 10 MG PO TABS Oral Take 10 mg by mouth daily.        BP 157/83  Pulse 125  Temp(Src) 101.9 F (38.8 C) (Oral)  Resp 20  SpO2 97%  Physical Exam  Constitutional: She is oriented to person, place, and time. She appears well-developed and well-nourished.  Non-toxic appearance. She does not have a sickly appearance.  Obese, well-appearing woman lying on bed  HENT:  Head: Normocephalic and atraumatic.  Eyes: Conjunctivae, EOM and lids are normal. Pupils are equal, round, and reactive to light. No scleral icterus.  Neck: Trachea normal and normal range of motion. Neck supple.  Cardiovascular: Regular rhythm and normal heart sounds.  Tachycardia present.   Pulmonary/Chest: Effort normal and breath sounds normal.  Abdominal: Soft. Normal appearance. There is no tenderness. There is no rebound, no guarding and no CVA tenderness.  Genitourinary:       Rectal exam shows brown stool that is nonbloody and non-melenic in  nature.  Patient's right labia does have a tender red area that is nonfluctuant.  Musculoskeletal: Normal range of motion.  Neurological: She is alert and oriented to person, place, and time. She has normal strength.       Right side weakness which is the pt's baseline per her with past CVA  Skin: Skin is warm, dry and intact. No rash noted.  Psychiatric: She has a normal mood and affect. Her behavior is normal. Judgment and thought content normal.    ED Course  Procedures (including critical care time)   Labs Reviewed  CBC  DIFFERENTIAL  COMPREHENSIVE METABOLIC PANEL  URINALYSIS, ROUTINE W REFLEX MICROSCOPIC  I-STAT TROPONIN I   No results found.   No diagnosis found.    MDM   Date: 12/01/2010  Rate: 115  Rhythm: sinus tachycardia  QRS Axis: normal  Intervals: normal  ST/T Wave abnormalities: normal  Conduction Disutrbances:none  Narrative Interpretation:   Old EKG Reviewed: unchanged from 10/13/2010 except rate was 82.    Patient has been found to have infectious symptoms that are generalized in nature with generalized myalgias, and vomiting and fevers.  Patient is also found to be hyperglycemic but not in DKA.  She is receiving IV fluids and subcutaneous insulin to help treat her hyperglycemia.  Her heart rate is decreasing and her temperature is decreased with Tylenol.  Patient shows no signs of UTI or pneumonia on her x-ray.  She does have a mild cellulitis in her right labia which I'm giving her clindamycin for here in the emergency department.  Patient also mentioned that she did notice some bleeding and rectal exam was performed which showed no bloody stools and she has had a hysterectomy and so cannot have uterine cancer.  There is a small amount of drainage on her right labia that is clear brown fluid that may be the small amount of blood that she's seen.    Patient has been reassessed and her blood sugars still 455.  Heart rate is approximately 100.  Her blood  pressures are normal.  Her temperature has also increased again.  Given her persistent hyperglycemia and her cellulitis of her right labia I feel the patient requires admission at this time for further treatment of both entities.  On further physical exam of the right labia it is indurated in there is a small area that is draining but there is no fluctuant areas to require further drainage.  It does not extend down toward the perineum.  Patient has received clindamycin for further antibiotics.  She is going to receive further subcutaneous insulin and IV fluids for her blood sugar.  Lezlie Octave, MD 12/01/10 2058  I have discussed the patient with Dr. Terrence Dupont who will admit the patient to his service and is going to give orders to Maudie Mercury, the patient's nurse.  Lezlie Octave, MD 12/01/10 2120

## 2010-12-02 LAB — DIFFERENTIAL
Basophils Absolute: 0 10*3/uL (ref 0.0–0.1)
Basophils Relative: 0 % (ref 0–1)
Eosinophils Absolute: 0 10*3/uL (ref 0.0–0.7)
Eosinophils Relative: 0 % (ref 0–5)
Lymphocytes Relative: 14 % (ref 12–46)
Lymphs Abs: 1.6 10*3/uL (ref 0.7–4.0)
Monocytes Absolute: 1.1 10*3/uL — ABNORMAL HIGH (ref 0.1–1.0)
Monocytes Relative: 10 % (ref 3–12)
Neutro Abs: 8.7 10*3/uL — ABNORMAL HIGH (ref 1.7–7.7)
Neutrophils Relative %: 76 % (ref 43–77)

## 2010-12-02 LAB — CBC
HCT: 28.2 % — ABNORMAL LOW (ref 36.0–46.0)
Hemoglobin: 9.5 g/dL — ABNORMAL LOW (ref 12.0–15.0)
MCH: 30.7 pg (ref 26.0–34.0)
MCHC: 33.7 g/dL (ref 30.0–36.0)
MCV: 91.3 fL (ref 78.0–100.0)
Platelets: 144 10*3/uL — ABNORMAL LOW (ref 150–400)
RBC: 3.09 MIL/uL — ABNORMAL LOW (ref 3.87–5.11)
RDW: 14.7 % (ref 11.5–15.5)
WBC: 11.4 10*3/uL — ABNORMAL HIGH (ref 4.0–10.5)

## 2010-12-02 LAB — BASIC METABOLIC PANEL
BUN: 15 mg/dL (ref 6–23)
CO2: 21 mEq/L (ref 19–32)
Calcium: 9.1 mg/dL (ref 8.4–10.5)
Chloride: 102 mEq/L (ref 96–112)
Creatinine, Ser: 1.3 mg/dL — ABNORMAL HIGH (ref 0.50–1.10)
GFR calc Af Amer: 51 mL/min — ABNORMAL LOW (ref >90)
GFR calc non Af Amer: 44 mL/min — ABNORMAL LOW (ref >90)
Glucose, Bld: 318 mg/dL — ABNORMAL HIGH (ref 70–99)
Potassium: 3.6 mEq/L (ref 3.5–5.1)
Sodium: 135 mEq/L (ref 135–145)

## 2010-12-02 LAB — HEPATIC FUNCTION PANEL
ALT: 9 U/L (ref 0–35)
AST: 9 U/L (ref 0–37)
Albumin: 2.5 g/dL — ABNORMAL LOW (ref 3.5–5.2)
Alkaline Phosphatase: 106 U/L (ref 39–117)
Bilirubin, Direct: 0.2 mg/dL (ref 0.0–0.3)
Indirect Bilirubin: 0.3 mg/dL (ref 0.3–0.9)
Total Bilirubin: 0.5 mg/dL (ref 0.3–1.2)
Total Protein: 6.9 g/dL (ref 6.0–8.3)

## 2010-12-02 LAB — GLUCOSE, CAPILLARY
Glucose-Capillary: 350 mg/dL — ABNORMAL HIGH (ref 70–99)
Glucose-Capillary: 379 mg/dL — ABNORMAL HIGH (ref 70–99)
Glucose-Capillary: 346 mg/dL — ABNORMAL HIGH (ref 70–99)
Glucose-Capillary: 378 mg/dL — ABNORMAL HIGH (ref 70–99)

## 2010-12-02 LAB — TSH: TSH: 1.598 u[IU]/mL (ref 0.350–4.500)

## 2010-12-02 LAB — PHOSPHORUS: Phosphorus: 2.5 mg/dL (ref 2.3–4.6)

## 2010-12-02 LAB — MAGNESIUM: Magnesium: 1.2 mg/dL — ABNORMAL LOW (ref 1.5–2.5)

## 2010-12-02 MED ORDER — CLOPIDOGREL BISULFATE 75 MG PO TABS
75.0000 mg | ORAL_TABLET | Freq: Every day | ORAL | Status: DC
  Administered 2010-12-02 – 2010-12-04 (×3): 75 mg via ORAL
  Filled 2010-12-02 (×4): qty 1

## 2010-12-02 MED ORDER — MAGNESIUM SULFATE 40 MG/ML IJ SOLN: 4.0000 g | Freq: Once | INTRAMUSCULAR | Status: DC

## 2010-12-02 MED ORDER — METOPROLOL TARTRATE 25 MG PO TABS
25.0000 mg | ORAL_TABLET | Freq: Two times a day (BID) | ORAL | Status: DC
  Administered 2010-12-02 – 2010-12-15 (×26): 25 mg via ORAL
  Filled 2010-12-02 (×30): qty 1

## 2010-12-02 MED ORDER — MAGNESIUM SULFATE BOLUS VIA INFUSION: 4.0000 g | Freq: Once | INTRAVENOUS | Status: DC

## 2010-12-02 MED ORDER — SODIUM CHLORIDE 0.45 % IV SOLN
INTRAVENOUS | Status: DC
  Administered 2010-12-02 – 2010-12-03 (×2): via INTRAVENOUS

## 2010-12-02 MED ORDER — METOPROLOL TARTRATE 25 MG PO TABS: 25.0000 mg | ORAL_TABLET | Freq: Two times a day (BID) | ORAL | Status: DC

## 2010-12-02 MED ORDER — ENOXAPARIN SODIUM 40 MG/0.4ML ~~LOC~~ SOLN
40.0000 mg | SUBCUTANEOUS | Status: DC
  Administered 2010-12-02 – 2010-12-09 (×8): 40 mg via SUBCUTANEOUS
  Filled 2010-12-02 (×9): qty 0.4

## 2010-12-02 MED ORDER — ASPIRIN EC 81 MG PO TBEC
81.0000 mg | DELAYED_RELEASE_TABLET | Freq: Every day | ORAL | Status: DC
  Administered 2010-12-02 – 2010-12-15 (×14): 81 mg via ORAL
  Filled 2010-12-02 (×16): qty 1

## 2010-12-02 MED ORDER — INSULIN GLARGINE 100 UNIT/ML ~~LOC~~ SOLN
35.0000 [IU] | Freq: Two times a day (BID) | SUBCUTANEOUS | Status: DC
  Administered 2010-12-02 – 2010-12-03 (×2): 35 [IU] via SUBCUTANEOUS

## 2010-12-02 MED ORDER — ACETAMINOPHEN 325 MG PO TABS
650.0000 mg | ORAL_TABLET | Freq: Four times a day (QID) | ORAL | Status: DC | PRN
  Administered 2010-12-02 – 2010-12-03 (×6): 650 mg via ORAL
  Filled 2010-12-02 (×6): qty 2

## 2010-12-02 MED ORDER — LOSARTAN POTASSIUM 50 MG PO TABS: 100.0000 mg | ORAL_TABLET | Freq: Every day | ORAL | Status: DC

## 2010-12-02 MED ORDER — FERROUS SULFATE 325 (65 FE) MG PO TABS
325.0000 mg | ORAL_TABLET | Freq: Two times a day (BID) | ORAL | Status: DC
  Administered 2010-12-02 – 2010-12-15 (×27): 325 mg via ORAL
  Filled 2010-12-02 (×31): qty 1

## 2010-12-02 MED ORDER — INSULIN GLARGINE 100 UNIT/ML ~~LOC~~ SOLN
30.0000 [IU] | SUBCUTANEOUS | Status: DC
  Administered 2010-12-02: 30 [IU] via SUBCUTANEOUS
  Filled 2010-12-02: qty 3

## 2010-12-02 MED ORDER — ROSUVASTATIN CALCIUM 10 MG PO TABS
10.0000 mg | ORAL_TABLET | Freq: Every day | ORAL | Status: DC
  Administered 2010-12-02 – 2010-12-15 (×14): 10 mg via ORAL
  Filled 2010-12-02 (×15): qty 1

## 2010-12-02 MED ORDER — MAGNESIUM SULFATE 50 % IJ SOLN
Freq: Once | INTRAMUSCULAR | Status: AC
  Administered 2010-12-02: 11:00:00 via INTRAVENOUS
  Filled 2010-12-02: qty 250

## 2010-12-02 MED ORDER — MOXIFLOXACIN HCL IN NACL 400 MG/250ML IV SOLN
400.0000 mg | INTRAVENOUS | Status: DC
  Administered 2010-12-01: 22:00:00 via INTRAVENOUS
  Administered 2010-12-02 – 2010-12-03 (×2): 400 mg via INTRAVENOUS
  Filled 2010-12-02 (×3): qty 250

## 2010-12-02 MED ORDER — PANTOPRAZOLE SODIUM 40 MG PO TBEC
40.0000 mg | DELAYED_RELEASE_TABLET | Freq: Every day | ORAL | Status: DC
  Administered 2010-12-02 – 2010-12-15 (×13): 40 mg via ORAL
  Filled 2010-12-02 (×17): qty 1

## 2010-12-02 NOTE — Progress Notes (Signed)
Inpatient Diabetes Program Recommendations  AACE/ADA: New Consensus Statement on Inpatient Glycemic Control (2009)  Target Ranges:  Prepandial:   less than 140 mg/dL      Peak postprandial:   less than 180 mg/dL (1-2 hours)      Critically ill patients:  140 - 180 mg/dL   Reason for Visit: hyperglycemia and HgbA1C > 7.  Inpatient Diabetes Program Recommendations Insulin - Meal Coverage: please add Novolog 4 units tidwc for meal coverage insulin if pt eats >50% meal Outpatient Referral: Request MD order for Outpatient Diabetes Education for HgbA1C 11.2

## 2010-12-02 NOTE — Progress Notes (Signed)
Subjective:  Patient states she feels better today denies any fever or chills. Patient states lower abdominal redness and swelling has markedly improved. Her blood sugar is slightly better controlled.  Objective:  Vital Signs in the last 24 hours: Temp:  [99.3 F (37.4 C)-101.9 F (38.8 C)] 100.6 F (38.1 C) (11/06 0432) Pulse Rate:  [97-125] 112  (11/06 0432) Resp:  [16-28] 18  (11/06 0432) BP: (116-163)/(49-83) 116/74 mmHg (11/06 0432) SpO2:  [95 %-99 %] 95 % (11/06 0432) FiO2 (%):  [2 %] 2 % (11/06 0432)  Intake/Output from previous day:   Intake/Output from this shift: Total I/O In: 490 [P.O.:240; IV Piggyback:250] Out: -   Physical Exam: General appearance: alert and cooperative Neck: no adenopathy, no carotid bruit, no JVD, supple, symmetrical, trachea midline and thyroid not enlarged, symmetric, no tenderness/mass/nodules Lungs: clear to auscultation bilaterally Heart: regular rate and rhythm, S1, S2 normal, no murmur, click, rub or gallop Abdomen: soft, non-tender; bowel sounds normal; no masses,  no organomegaly Extremities: extremities normal, atraumatic, no cyanosis or edema Pulses: 2+ and symmetric Pubic and bilateral labial area redness markedly improved. Lab Results:  [facility] 12/02/10 0640 12/01/10 1630  WBC 11.4* 11.7*  HGB 9.5* 10.2*  PLT 144* 153    Basename 12/02/10 0640 12/01/10 1630  NA 135 134*  K 3.6 3.5  CL 102 98  CO2 21 23  GLUCOSE 318* 479*  BUN 15 14  CREATININE 1.30* 1.32*    Basename 12/01/10 1630  TROPONINI <0.30   Hepatic Function Panel  Basename 12/02/10 0640  PROT 6.9  ALBUMIN 2.5*  AST 9  ALT 9  ALKPHOS 106  BILITOT 0.5  BILIDIR 0.2  IBILI 0.3   No results found for this basename: CHOL in the last 72 hours No results found for this basename: PROTIME in the last 72 hours  Imaging: {  Cardiac Studies:  Assessment/Plan:  Resolving cellulitis of the labial and pubic area. Uncontrolled diabetes  mellitus. Coronary artery disease stable Morbid obesity Hypertension Obstructive sleep apnea Scoliosis Chronic anemia Hypo magnesium  History of TIA in the past Plan Increase Lantus insulin per orders Check stool for occult blood Replace magnesium Check labs in a.m.   LOS: 1 day    Ellese Julius N 12/02/2010, 11:18 AM

## 2010-12-03 LAB — GLUCOSE, CAPILLARY
Glucose-Capillary: 338 mg/dL — ABNORMAL HIGH (ref 70–99)
Glucose-Capillary: 307 mg/dL — ABNORMAL HIGH (ref 70–99)
Glucose-Capillary: 295 mg/dL — ABNORMAL HIGH (ref 70–99)
Glucose-Capillary: 300 mg/dL — ABNORMAL HIGH (ref 70–99)

## 2010-12-03 LAB — BASIC METABOLIC PANEL
BUN: 23 mg/dL (ref 6–23)
CO2: 21 mEq/L (ref 19–32)
Calcium: 9.3 mg/dL (ref 8.4–10.5)
Chloride: 99 mEq/L (ref 96–112)
Creatinine, Ser: 2.09 mg/dL — ABNORMAL HIGH (ref 0.50–1.10)
GFR calc Af Amer: 29 mL/min — ABNORMAL LOW (ref >90)
GFR calc non Af Amer: 25 mL/min — ABNORMAL LOW (ref >90)
Glucose, Bld: 384 mg/dL — ABNORMAL HIGH (ref 70–99)
Potassium: 3.6 mEq/L (ref 3.5–5.1)
Sodium: 131 mEq/L — ABNORMAL LOW (ref 135–145)

## 2010-12-03 LAB — CBC
HCT: 26.8 % — ABNORMAL LOW (ref 36.0–46.0)
Hemoglobin: 9 g/dL — ABNORMAL LOW (ref 12.0–15.0)
MCH: 30.6 pg (ref 26.0–34.0)
MCHC: 33.6 g/dL (ref 30.0–36.0)
MCV: 91.2 fL (ref 78.0–100.0)
Platelets: 150 10*3/uL (ref 150–400)
RBC: 2.94 MIL/uL — ABNORMAL LOW (ref 3.87–5.11)
RDW: 14.7 % (ref 11.5–15.5)
WBC: 11.5 10*3/uL — ABNORMAL HIGH (ref 4.0–10.5)

## 2010-12-03 MED ORDER — SODIUM CHLORIDE 0.9 % NICU IV INFUSION SIMPLE
INJECTION | INTRAVENOUS | Status: DC
  Administered 2010-12-03 – 2010-12-15 (×12): via INTRAVENOUS
  Filled 2010-12-03 (×55): qty 500

## 2010-12-03 MED ORDER — VANCOMYCIN HCL IN DEXTROSE 1-5 GM/200ML-% IV SOLN
1000.0000 mg | Freq: Once | INTRAVENOUS | Status: AC
  Administered 2010-12-03: 1000 mg via INTRAVENOUS
  Filled 2010-12-03: qty 200

## 2010-12-03 MED ORDER — SODIUM CHLORIDE 0.9 % IV SOLN
250.0000 mg | Freq: Four times a day (QID) | INTRAVENOUS | Status: DC
  Administered 2010-12-03: 17:00:00 via INTRAVENOUS
  Administered 2010-12-03 – 2010-12-07 (×14): 250 mg via INTRAVENOUS
  Filled 2010-12-03 (×17): qty 250

## 2010-12-03 MED ORDER — ACETAMINOPHEN 325 MG PO TABS
650.0000 mg | ORAL_TABLET | ORAL | Status: DC | PRN
  Administered 2010-12-04 – 2010-12-05 (×7): 650 mg via ORAL
  Filled 2010-12-03 (×4): qty 2
  Filled 2010-12-03: qty 1
  Filled 2010-12-03 (×2): qty 2

## 2010-12-03 MED ORDER — AMLODIPINE BESYLATE 5 MG PO TABS
5.0000 mg | ORAL_TABLET | Freq: Every day | ORAL | Status: DC
  Administered 2010-12-03 – 2010-12-15 (×14): 5 mg via ORAL
  Filled 2010-12-03 (×14): qty 1

## 2010-12-03 MED ORDER — INSULIN GLARGINE 100 UNIT/ML ~~LOC~~ SOLN
40.0000 [IU] | Freq: Two times a day (BID) | SUBCUTANEOUS | Status: DC
  Administered 2010-12-03 – 2010-12-08 (×11): 40 [IU] via SUBCUTANEOUS
  Filled 2010-12-03 (×2): qty 3

## 2010-12-03 NOTE — Consult Note (Signed)
WOC consult Note Reason for Consult:cellulitis in pubis region and opening draining ulcer in gluteal fold Wound type: infectious; spontaneous opening Measurement: Open wound measures .4 x .4 x..2.  Indurated area on pubis (cellulits) has not opened Wound bed: clean, pink. Drainage (amount, consistency, odor) serosanguinous Periwound: macerated at gluteal wound due to patient wearing damp peripad.   Dressing procedure/placement/frequency: Recommend BID and PRN Cleansing of these areas with periwash and keeping areas open to air. I will not follow.  Please re-consult if needed. Thanks, Maudie Flakes, MSN, RN, [facility], [place] [phone])

## 2010-12-03 NOTE — Progress Notes (Signed)
Subjective:  Denies chest pain or  Complains of mild suprapubic pain.  shortness of breath. Denies any fever or chills. Objective:  Vital Signs in the last 24 hours: Temp:  [99.8 F (37.7 C)-102.1 F (38.9 C)] 99.8 F (37.7 C) (11/07 1100) Pulse Rate:  [89-98] 89  (11/07 1100) Resp:  [19-20] 19  (11/07 1100) BP: (129-148)/(74-83) 129/82 mmHg (11/07 1100) SpO2:  [92 %-94 %] 94 % (11/07 1100) Weight:  [114.6 kg (252 lb 10.4 oz)] 252 lb 10.4 oz (114.6 kg) (11/06 1603)  Intake/Output from previous day: 11/06 0701 - 11/07 0700 In: 1990.8 [P.O.:1070; I.V.:420.8; IV Piggyback:500] Out: [id number] [Urine:875] Intake/Output from this shift: Total I/O In: 120 [P.O.:120] Out: 75 [Urine:75]  Physical Exam: General appearance: alert and cooperative Neck: no adenopathy, no carotid bruit, no JVD, supple, symmetrical, trachea midline and thyroid not enlarged, symmetric, no tenderness/mass/nodules Lungs: clear to auscultation bilaterally Heart: regular rate and rhythm, S1, S2 normal, no murmur, click, rub or gallop Abdomen: soft, non-tender; bowel sounds normal; no masses,  no organomegaly Extremities: extremities normal, atraumatic, no cyanosis or edema  Lab Results:  [facility] 12/03/10 12/02/10 0640  WBC 11.5* 11.4*  HGB 9.0* 9.5*  PLT 150 144*    Basename 12/03/10 12/02/10 0640  NA 131* 135  K 3.6 3.6  CL 99 102  CO2 21 21  GLUCOSE 384* 318*  BUN 23 15  CREATININE 2.09* 1.30*    Basename 12/01/10 1630  TROPONINI <0.30   Hepatic Function Panel  Basename 12/02/10 0640  PROT 6.9  ALBUMIN 2.5*  AST 9  ALT 9  ALKPHOS 106  BILITOT 0.5  BILIDIR 0.2  IBILI 0.3   No results found for this basename: CHOL in the last 72 hours No results found for this basename: PROTIME in the last 72 hours  Imaging:   Cardiac Studies:  Assessment/Plan:  Resolving cellulitis of pubic area. Acute on chronic renal insufficiency Coronary artery disease Hypertension Uncontrolled diabetes  mellitus Morbid obesity History of degenerative joint disease with scoliosis Plan as per orders.  LOS: 2 days    Michelle Holmes 12/03/2010, 1:03 PM

## 2010-12-03 NOTE — Progress Notes (Signed)
ANTIBIOTIC CONSULT NOTE - INITIAL  Pharmacy Consult for Vancomycin & Primaxin Indication: Cellulitis & Opening draining ulcer   Allergies  Allergen Reactions  . Penicillins Hives    Patient Measurements: Height: 5\' 5"  (165.1 cm) Weight: 252 lb 10.4 oz (114.6 kg) IBW/kg (Calculated) : 57   Vital Signs: Temp: 102.6 F (39.2 C) (11/07 1400) Temp src: Oral (11/07 1400) BP: 151/73 mmHg (11/07 1400) Pulse Rate: 86  (11/07 1400) Intake/Output from previous day: 11/06 0701 - 11/07 0700 In: 1990.8 [P.O.:1070; I.V.:420.8; IV Piggyback:500] Out: [id number] [Urine:875] Intake/Output from this shift: Total I/O In: 240 [P.O.:240] Out: 175 [Urine:175]  Labs:  [facility] 12/03/10 12/02/10 0640 12/01/10 1630  WBC 11.5* 11.4* 11.7*  HGB 9.0* 9.5* 10.2*  PLT 150 144* 153  LABCREA -- -- --  CREATININE 2.09* 1.30* 1.32*   Estimated Creatinine Clearance: 36.6 ml/min (by C-G formula based on Cr of 2.09). Normalized CrCl = 33 ml/min.  No results found for this basename: VANCOTROUGH:2,VANCOPEAK:2,VANCORANDOM:2,GENTTROUGH:2,GENTPEAK:2,GENTRANDOM:2,TOBRATROUGH:2,TOBRAPEAK:2,TOBRARND:2,AMIKACINPEAK:2,AMIKACINTROU:2,AMIKACIN:2, in the last 72 hours   Microbiology: No results found for this or any previous visit (from the past 720 hour(s)).  Medical History: Past Medical History  Diagnosis Date  . Emphysema   . Colon cancer   . Hypertension   . Hypercholesteremia   . Diabetes mellitus   . COPD (chronic obstructive pulmonary disease)   . Coronary artery disease   . Stroke     Medications:  Scheduled:    . amLODipine  5 mg Oral Daily  . aspirin EC  81 mg Oral Daily  . clopidogrel  75 mg Oral Daily  . enoxaparin  40 mg Subcutaneous Q24H  . ferrous sulfate  325 mg Oral BID WC  . insulin aspart  0-9 Units Subcutaneous TID WC  . insulin glargine  40 Units Subcutaneous BID  . metoprolol tartrate  25 mg Oral BID  . pantoprazole  40 mg Oral Q0600  . rosuvastatin  10 mg Oral q1800  .  DISCONTD: insulin glargine  35 Units Subcutaneous BID  . DISCONTD: moxifloxacin  400 mg Intravenous Q24H   Infustions:    . sodium chloride 0.9 % 75 mL/hr at 12/03/10 1441  . DISCONTD: sodium chloride 75 mL/hr at 12/03/10 0734   Assessment: 53 YOF admitted with cellulitis in pubis region and opening draining ulcer in gluteal fold.  Pt was started on IV avelox now with ongoing fever to d/c Avelox and start vancomycin and primaxin per pharmacy.  Pending blood cultures x 2, urine cx. Scr declined greatly since yesterday.  Pt wts 114 kg. Normalized CrCl 33 ml/min.    Goal of Therapy:  Vancomycin trough level 10-15 mcg/ml  Plan:  Will not load Vancomycin although pt wts > 100 kg given poor renal function and UOP (0.3 ml/kg/hr).  Will start with Vancomycin 1gm iv x 1.  Primaxin 250 mg iv q6hours.  Pharmacy will f/u with renal function in AM and redose vancomycin as appropriate.   Vanessa [place] Thi 12/03/2010,3:20 PM

## 2010-12-04 LAB — DIFFERENTIAL
Band Neutrophils: 7 % (ref 0–10)
Basophils Absolute: 0 10*3/uL (ref 0.0–0.1)
Basophils Relative: 0 % (ref 0–1)
Eosinophils Absolute: 0 10*3/uL (ref 0.0–0.7)
Eosinophils Relative: 0 % (ref 0–5)
Lymphocytes Relative: 7 % — ABNORMAL LOW (ref 12–46)
Lymphs Abs: 0.8 10*3/uL (ref 0.7–4.0)
Metamyelocytes Relative: 1 %
Monocytes Absolute: 1.4 10*3/uL — ABNORMAL HIGH (ref 0.1–1.0)
Monocytes Relative: 13 % — ABNORMAL HIGH (ref 3–12)
Neutro Abs: 8.7 10*3/uL — ABNORMAL HIGH (ref 1.7–7.7)
Neutrophils Relative %: 72 % (ref 43–77)

## 2010-12-04 LAB — BASIC METABOLIC PANEL
BUN: 23 mg/dL (ref 6–23)
CO2: 21 mEq/L (ref 19–32)
Calcium: 9.2 mg/dL (ref 8.4–10.5)
Chloride: 104 mEq/L (ref 96–112)
Creatinine, Ser: 2.35 mg/dL — ABNORMAL HIGH (ref 0.50–1.10)
GFR calc Af Amer: 25 mL/min — ABNORMAL LOW (ref >90)
GFR calc non Af Amer: 22 mL/min — ABNORMAL LOW (ref >90)
Glucose, Bld: 262 mg/dL — ABNORMAL HIGH (ref 70–99)
Potassium: 3.4 mEq/L — ABNORMAL LOW (ref 3.5–5.1)
Sodium: 135 mEq/L (ref 135–145)

## 2010-12-04 LAB — URINALYSIS, ROUTINE W REFLEX MICROSCOPIC
Glucose, UA: 100 mg/dL — AB
Nitrite: NEGATIVE
Protein, ur: 100 mg/dL — AB
Specific Gravity, Urine: 1.026 (ref 1.005–1.030)
Urobilinogen, UA: 2 mg/dL — ABNORMAL HIGH (ref 0.0–1.0)
pH: 5.5 (ref 5.0–8.0)

## 2010-12-04 LAB — URINE MICROSCOPIC-ADD ON

## 2010-12-04 LAB — CBC
HCT: 26.1 % — ABNORMAL LOW (ref 36.0–46.0)
Hemoglobin: 8.6 g/dL — ABNORMAL LOW (ref 12.0–15.0)
MCH: 30.1 pg (ref 26.0–34.0)
MCHC: 33 g/dL (ref 30.0–36.0)
MCV: 91.3 fL (ref 78.0–100.0)
Platelets: 159 10*3/uL (ref 150–400)
RBC: 2.86 MIL/uL — ABNORMAL LOW (ref 3.87–5.11)
RDW: 15 % (ref 11.5–15.5)
WBC: 10.9 10*3/uL — ABNORMAL HIGH (ref 4.0–10.5)

## 2010-12-04 LAB — GLUCOSE, CAPILLARY
Glucose-Capillary: 232 mg/dL — ABNORMAL HIGH (ref 70–99)
Glucose-Capillary: 248 mg/dL — ABNORMAL HIGH (ref 70–99)
Glucose-Capillary: 247 mg/dL — ABNORMAL HIGH (ref 70–99)

## 2010-12-04 MED ORDER — VANCOMYCIN HCL 1000 MG IV SOLR
1500.0000 mg | INTRAVENOUS | Status: DC
  Filled 2010-12-04: qty 1500

## 2010-12-04 NOTE — Progress Notes (Signed)
Inpatient Diabetes Program Recommendations  AACE/ADA: New Consensus Statement on Inpatient Glycemic Control (2009)  Target Ranges:  Prepandial:   less than 140 mg/dL      Peak postprandial:   less than 180 mg/dL (1-2 hours)      Critically ill patients:  140 - 180 mg/dL   Reason for Visit:Hyperglycemia  Inpatient Diabetes Program Recommendations Correction (SSI): Increase to resistant scale  Insulin - Meal Coverage: please add Novolog 4 units tidwc for meal coverage insulin if pt eats >50% meal Outpatient Referral: Request MD order for Outpatient Diabetes Education for HgbA1C 11.2

## 2010-12-04 NOTE — Progress Notes (Signed)
Subjective:  Patient complains of suprapubic pain associated with swelling. The patient spiked fever or about 103 last night was started on Vanco and Primaxin. Her cultures are still pending. Patient denies any chest pain shortness of breath. Denies any urinary complaints.  Objective:  Vital Signs in the last 24 hours: Temp:  [99.8 F (37.7 C)-103 F (39.4 C)] 99.9 F (37.7 C) (11/08 0549) Pulse Rate:  [86-102] 100  (11/08 0549) Resp:  [18-20] 20  (11/08 0549) BP: (126-151)/(66-82) 132/72 mmHg (11/08 0549) SpO2:  [92 %-94 %] 94 % (11/08 0549) Weight:  [115.7 kg (255 lb 1.2 oz)] 255 lb 1.2 oz (115.7 kg) (11/08 0609)  Intake/Output from previous day: 11/07 0701 - 11/08 0700 In: 840 [P.O.:240; I.V.:600] Out: 775 [Urine:775] Intake/Output from this shift: Total I/O In: -  Out: 100 [Urine:100]  Physical Exam: General appearance: alert and cooperative Neck: no adenopathy, no carotid bruit, no JVD, supple, symmetrical, trachea midline and thyroid not enlarged, symmetric, no tenderness/mass/nodules Lungs: clear to auscultation bilaterally Heart: regular rate and rhythm, S1, S2 normal, no murmur, click, rub or gallop Abdomen: soft, non-tender; bowel sounds normal; no masses,  no organomegaly Extremities: extremities normal, atraumatic, no cyanosis or edema Pulses: 2+ and symmetric There is a 2 x 4 cm indurated area in the right pubic area. In this indurated area there is no fluctuation which feels hard to touch.  Lab Results:  Basename 12/04/10 0435 12/03/10  WBC 10.9* 11.5*  HGB 8.6* 9.0*  PLT 159 150    Basename 12/04/10 0435 12/03/10  NA 135 131*  K 3.4* 3.6  CL 104 99  CO2 21 21  GLUCOSE 262* 384*  BUN 23 23  CREATININE 2.35* 2.09*    Basename 12/01/10 1630  TROPONINI <0.30   Hepatic Function Panel  Basename 12/02/10 0640  PROT 6.9  ALBUMIN 2.5*  AST 9  ALT 9  ALKPHOS 106  BILITOT 0.5  BILIDIR 0.2  IBILI 0.3   No results found for this basename: CHOL  in the last 72 hours No results found for this basename: PROTIME in the last 72 hours    Cardiac Studies:  Assessment/Plan:  Impression Cellulitis of the pubic region Uncontrolled diabetes mellitus improved Acute on chronic renal insufficiency Coronary artery disease stable Morbid obesity History of scoliosis Hypoalbuminemia Plan Continue present antibiotics. Increase IV fluids are ordered DC Plavix for now Check stool for blood Check labs in a.m.   LOS: 3 days    Olney Monier N 12/04/2010, 8:25 AM

## 2010-12-04 NOTE — Progress Notes (Signed)
ANTIBIOTIC CONSULT NOTE - FOLLOW UP  Pharmacy Consult for Vanco and Primaxin  Indication: Cellulitis & Opening draining ulcer   Allergies  Allergen Reactions  . Penicillins Hives    Patient Measurements: Height: 5\' 5"  (165.1 cm) Weight: 255 lb 1.2 oz (115.7 kg) IBW/kg (Calculated) : 57   Vital Signs: Temp: 99.9 F (37.7 C) (11/08 0549) Temp src: Oral (11/08 0549) BP: 132/72 mmHg (11/08 0549) Pulse Rate: 100  (11/08 0549) Intake/Output from previous day: 11/07 0701 - 11/08 0700 In: 840 [P.O.:240; I.V.:600] Out: 775 [Urine:775] Intake/Output from this shift: Total I/O In: -  Out: 100 [Urine:100]  Labs:  [facility] 12/04/10 0435 12/03/10 12/02/10 0640  WBC 10.9* 11.5* 11.4*  HGB 8.6* 9.0* 9.5*  PLT 159 150 144*  LABCREA -- -- --  CREATININE 2.35* 2.09* 1.30*   Estimated Creatinine Clearance: 32.8 ml/min (by C-G formula based on Cr of 2.35).  CrCl (normalized) = 29 ml/min  Microbiology: Recent Results (from the past 720 hour(s))  CULTURE, BLOOD (ROUTINE X 2)     Status: Normal (Preliminary result)   Collection Time   12/03/10  4:15 PM      Component Value Range Status Comment   Specimen Description BLOOD RIGHT ARM   Final    Special Requests BOTTLES DRAWN AEROBIC AND ANAEROBIC 4CC   Final    Setup Time [id number]   Final    Culture     Final    Value:        BLOOD CULTURE RECEIVED NO GROWTH TO DATE CULTURE WILL BE HELD FOR 5 DAYS BEFORE ISSUING A FINAL NEGATIVE REPORT   Report Status PENDING   Incomplete   CULTURE, BLOOD (ROUTINE X 2)     Status: Normal (Preliminary result)   Collection Time   12/03/10  4:30 PM      Component Value Range Status Comment   Specimen Description BLOOD RIGHT ARM   Final    Special Requests BOTTLES DRAWN AEROBIC AND ANAEROBIC 10CC   Final    Setup Time [id number]   Final    Culture     Final    Value:        BLOOD CULTURE RECEIVED NO GROWTH TO DATE CULTURE WILL BE HELD FOR 5 DAYS BEFORE ISSUING A FINAL NEGATIVE REPORT   Report  Status PENDING   Incomplete     Anti-infectives     Start     Dose/Rate Route Frequency Ordered Stop   12/03/10 1600   vancomycin (VANCOCIN) IVPB 1000 mg/200 mL premix        1,000 mg 200 mL/hr over 60 Minutes Intravenous  Once 12/03/10 1541 12/03/10 1836   12/03/10 1600   imipenem-cilastatin (PRIMAXIN) 250 mg in sodium chloride 0.9 % 100 mL IVPB        250 mg 200 mL/hr over 30 Minutes Intravenous Every 6 hours 12/03/10 1541     12/02/10 0600   moxifloxacin (AVELOX) IVPB 400 mg  Status:  Discontinued        400 mg 250 mL/hr over 60 Minutes Intravenous Every 24 hours 12/02/10 0532 12/03/10 1511   12/02/10 0000   moxifloxacin (AVELOX) IVPB 400 mg  Status:  Discontinued        400 mg 250 mL/hr over 60 Minutes Intravenous Daily 12/01/10 2328 12/02/10 0532   12/01/10 2215   moxifloxacin (AVELOX) IVPB 400 mg        400 mg 250 mL/hr over 60 Minutes Intravenous  Once 12/01/10 2209 12/01/10 2324  12/01/10 1915   clindamycin (CLEOCIN) IVPB 600 mg        600 mg 100 mL/hr over 30 Minutes Intravenous  Once 12/01/10 1906 12/01/10 2017          Assessment: 70 YOF admitted with cellulitis in pubis region and opening draining ulcer in gluteal fold. Pt was started on IV avelox now with ongoing fever to d/c Avelox and start vancomycin and primaxin per pharmacy. Pt has been started on Primaxin 250mg  IV q6h and given Vanco 1g IV x1 at 18:30 last night.  This am, SCr= 2.35, CrCl (n)=66ml/min, wt= 115.7kg. For now, will dose Vanco q48h. If SCr continues to worsen, may need to change to dosing per Level.  Goal of Therapy:  Vancomycin trough level 10-15 mcg/ml  Plan:  1) Vanco 1500mg  IV q48h (next due tomorrow at 6am). 2) Continue Primaxin 250mg  IV q6h. 3) Will follow renal function and adjust doses PRN.  Samul Dada 12/04/2010,9:45 AM

## 2010-12-05 LAB — DIFFERENTIAL
Band Neutrophils: 8 % (ref 0–10)
Basophils Absolute: 0 10*3/uL (ref 0.0–0.1)
Basophils Relative: 0 % (ref 0–1)
Eosinophils Absolute: 0.1 10*3/uL (ref 0.0–0.7)
Eosinophils Relative: 1 % (ref 0–5)
Lymphocytes Relative: 10 % — ABNORMAL LOW (ref 12–46)
Lymphs Abs: 1.1 10*3/uL (ref 0.7–4.0)
Monocytes Absolute: 1.5 10*3/uL — ABNORMAL HIGH (ref 0.1–1.0)
Monocytes Relative: 13 % — ABNORMAL HIGH (ref 3–12)
Neutro Abs: 8.5 10*3/uL — ABNORMAL HIGH (ref 1.7–7.7)
Neutrophils Relative %: 68 % (ref 43–77)

## 2010-12-05 LAB — COMPREHENSIVE METABOLIC PANEL
ALT: 16 U/L (ref 0–35)
AST: 25 U/L (ref 0–37)
Albumin: 2.1 g/dL — ABNORMAL LOW (ref 3.5–5.2)
Alkaline Phosphatase: 224 U/L — ABNORMAL HIGH (ref 39–117)
BUN: 26 mg/dL — ABNORMAL HIGH (ref 6–23)
CO2: 20 mEq/L (ref 19–32)
Calcium: 9.1 mg/dL (ref 8.4–10.5)
Chloride: 106 mEq/L (ref 96–112)
Creatinine, Ser: 2.14 mg/dL — ABNORMAL HIGH (ref 0.50–1.10)
GFR calc Af Amer: 28 mL/min — ABNORMAL LOW (ref >90)
GFR calc non Af Amer: 24 mL/min — ABNORMAL LOW (ref >90)
Glucose, Bld: 229 mg/dL — ABNORMAL HIGH (ref 70–99)
Potassium: 3.5 mEq/L (ref 3.5–5.1)
Sodium: 137 mEq/L (ref 135–145)
Total Bilirubin: 0.4 mg/dL (ref 0.3–1.2)
Total Protein: 6.8 g/dL (ref 6.0–8.3)

## 2010-12-05 LAB — CBC
HCT: 26.5 % — ABNORMAL LOW (ref 36.0–46.0)
Hemoglobin: 8.6 g/dL — ABNORMAL LOW (ref 12.0–15.0)
MCH: 29.8 pg (ref 26.0–34.0)
MCHC: 32.5 g/dL (ref 30.0–36.0)
MCV: 91.7 fL (ref 78.0–100.0)
Platelets: 198 10*3/uL (ref 150–400)
RBC: 2.89 MIL/uL — ABNORMAL LOW (ref 3.87–5.11)
RDW: 15.6 % — ABNORMAL HIGH (ref 11.5–15.5)
WBC: 11.2 10*3/uL — ABNORMAL HIGH (ref 4.0–10.5)

## 2010-12-05 LAB — GLUCOSE, CAPILLARY
Glucose-Capillary: 212 mg/dL — ABNORMAL HIGH (ref 70–99)
Glucose-Capillary: 218 mg/dL — ABNORMAL HIGH (ref 70–99)
Glucose-Capillary: 187 mg/dL — ABNORMAL HIGH (ref 70–99)
Glucose-Capillary: 238 mg/dL — ABNORMAL HIGH (ref 70–99)

## 2010-12-05 LAB — URINE CULTURE
Colony Count: NO GROWTH
Culture  Setup Time: 201211080937
Culture: NO GROWTH
Special Requests: NORMAL

## 2010-12-05 MED ORDER — HYDROCODONE-ACETAMINOPHEN 5-325 MG PO TABS
1.0000 | ORAL_TABLET | Freq: Three times a day (TID) | ORAL | Status: DC | PRN
  Administered 2010-12-05 – 2010-12-06 (×3): 2 via ORAL
  Filled 2010-12-05 (×3): qty 2

## 2010-12-05 MED ORDER — ACETAMINOPHEN 325 MG PO TABS
650.0000 mg | ORAL_TABLET | Freq: Four times a day (QID) | ORAL | Status: DC | PRN
  Administered 2010-12-05 – 2010-12-10 (×7): 650 mg via ORAL
  Filled 2010-12-05 (×7): qty 2

## 2010-12-05 NOTE — Progress Notes (Signed)
Subjective:  Patient denies chest pain or shortness of breath Patient states her suprapubic pain has reduced and swelling also has improved Her blood sugar is the better controlled. But remains and 200s.  Objective:  Vital Signs in the last 24 hours: Temp:  [99 F (37.2 C)-100.7 F (38.2 C)] 99.8 F (37.7 C) (11/09 0558) Pulse Rate:  [76-96] 79  (11/09 0558) Resp:  [18-19] 18  (11/09 0558) BP: (121-139)/(69-76) 121/69 mmHg (11/09 0558) SpO2:  [92 %-94 %] 94 % (11/09 0558) Weight:  [114.6 kg (252 lb 10.4 oz)] 252 lb 10.4 oz (114.6 kg) (11/09 0558)  Intake/Output from previous day: 11/08 0701 - 11/09 0700 In: 4020 [P.O.:240; I.V.:3380; IV Piggyback:400] Out: 400 [Urine:400] Intake/Output from this shift:    Physical Exam: General appearance: alert and cooperative Neck: no adenopathy and no JVD Lungs: clear to auscultation bilaterally Heart: regular rate and rhythm, S1, S2 normal, no murmur, click, rub or gallop Abdomen: soft, non-tender; bowel sounds normal; no masses,  no organomegaly Extremities: extremities normal, atraumatic, no cyanosis or edema Pulses: 2+ and symmetric Suprapubic swelling and erythema mildly reduced. Lab Results:  Basename 12/05/10 0444 12/04/10 0435  WBC 11.2* 10.9*  HGB 8.6* 8.6*  PLT 198 159    Basename 12/05/10 0444 12/04/10 0435  NA 137 135  K 3.5 3.4*  CL 106 104  CO2 20 21  GLUCOSE 229* 262*  BUN 26* 23  CREATININE 2.14* 2.35*   No results found for this basename: TROPONINI:2,CK,MB:2 in the last 72 hours Hepatic Function Panel  Basename 12/05/10 0444  PROT 6.8  ALBUMIN 2.1*  AST 25  ALT 16  ALKPHOS 224*  BILITOT 0.4  BILIDIR --  IBILI --   No results found for this basename: CHOL in the last 72 hours No results found for this basename: PROTIME in the last 72 hours  Imaging:   Cardiac Studies:  Assessment/Plan:  Impression Resolving cellulitis of suprapubic region. Uncontrolled diabetes mellitus improved Resolving  acute on chronic renal insufficiency Coronary artery disease stable Morbid obesity Obstructive sleep apnea obesity hypoventilation syndrome  degenerative joint disease with scoliosis Chronic anemia stable Plan Increase Lantus insulin per orders Out of bed as tolerated Ambulate with assistance Dr. Juluis Pitch on call for me for weekend  LOS: 4 days    Lindia Garms N 12/05/2010, 8:10 AM

## 2010-12-05 NOTE — Plan of Care (Signed)
Problem: Phase I Progression Outcomes Goal: Initial discharge plan identified Outcome: Completed/Met Date Met:  12/05/10 Plans to go home

## 2010-12-05 NOTE — Plan of Care (Signed)
Problem: Phase I Progression Outcomes Goal: Pain controlled with appropriate interventions Outcome: Not Progressing Increased pain not controlled by Tylenol

## 2010-12-06 LAB — DIFFERENTIAL
Basophils Absolute: 0.1 10*3/uL (ref 0.0–0.1)
Basophils Relative: 1 % (ref 0–1)
Eosinophils Absolute: 0.2 10*3/uL (ref 0.0–0.7)
Eosinophils Relative: 2 % (ref 0–5)
Lymphocytes Relative: 22 % (ref 12–46)
Lymphs Abs: 2.7 10*3/uL (ref 0.7–4.0)
Monocytes Absolute: 1.2 10*3/uL — ABNORMAL HIGH (ref 0.1–1.0)
Monocytes Relative: 10 % (ref 3–12)
Neutro Abs: 7.9 10*3/uL — ABNORMAL HIGH (ref 1.7–7.7)
Neutrophils Relative %: 65 % (ref 43–77)

## 2010-12-06 LAB — GLUCOSE, CAPILLARY
Glucose-Capillary: 229 mg/dL — ABNORMAL HIGH (ref 70–99)
Glucose-Capillary: 158 mg/dL — ABNORMAL HIGH (ref 70–99)
Glucose-Capillary: 138 mg/dL — ABNORMAL HIGH (ref 70–99)
Glucose-Capillary: 133 mg/dL — ABNORMAL HIGH (ref 70–99)

## 2010-12-06 LAB — COMPREHENSIVE METABOLIC PANEL
ALT: 12 U/L (ref 0–35)
AST: 18 U/L (ref 0–37)
Albumin: 2 g/dL — ABNORMAL LOW (ref 3.5–5.2)
Alkaline Phosphatase: 215 U/L — ABNORMAL HIGH (ref 39–117)
BUN: 19 mg/dL (ref 6–23)
CO2: 20 mEq/L (ref 19–32)
Calcium: 9.2 mg/dL (ref 8.4–10.5)
Chloride: 107 mEq/L (ref 96–112)
Creatinine, Ser: 1.59 mg/dL — ABNORMAL HIGH (ref 0.50–1.10)
GFR calc Af Amer: 40 mL/min — ABNORMAL LOW (ref >90)
GFR calc non Af Amer: 34 mL/min — ABNORMAL LOW (ref >90)
Glucose, Bld: 129 mg/dL — ABNORMAL HIGH (ref 70–99)
Potassium: 3.2 mEq/L — ABNORMAL LOW (ref 3.5–5.1)
Sodium: 139 mEq/L (ref 135–145)
Total Bilirubin: 0.3 mg/dL (ref 0.3–1.2)
Total Protein: 6.6 g/dL (ref 6.0–8.3)

## 2010-12-06 LAB — CBC
HCT: 24.2 % — ABNORMAL LOW (ref 36.0–46.0)
Hemoglobin: 8.2 g/dL — ABNORMAL LOW (ref 12.0–15.0)
MCH: 30.7 pg (ref 26.0–34.0)
MCHC: 33.9 g/dL (ref 30.0–36.0)
MCV: 90.6 fL (ref 78.0–100.0)
Platelets: 231 10*3/uL (ref 150–400)
RBC: 2.67 MIL/uL — ABNORMAL LOW (ref 3.87–5.11)
RDW: 15.4 % (ref 11.5–15.5)
WBC: 12.1 10*3/uL — ABNORMAL HIGH (ref 4.0–10.5)

## 2010-12-06 MED ORDER — HYDROCODONE-ACETAMINOPHEN 5-325 MG PO TABS
1.0000 | ORAL_TABLET | Freq: Four times a day (QID) | ORAL | Status: DC | PRN
  Administered 2010-12-06 – 2010-12-07 (×5): 2 via ORAL
  Administered 2010-12-08 (×2): 1 via ORAL
  Administered 2010-12-08 – 2010-12-10 (×8): 2 via ORAL
  Administered 2010-12-11 (×2): 1 via ORAL
  Administered 2010-12-11 – 2010-12-15 (×13): 2 via ORAL
  Filled 2010-12-06: qty 1
  Filled 2010-12-06 (×10): qty 2
  Filled 2010-12-06: qty 1
  Filled 2010-12-06 (×4): qty 2
  Filled 2010-12-06: qty 1
  Filled 2010-12-06 (×8): qty 2
  Filled 2010-12-06: qty 1
  Filled 2010-12-06: qty 2
  Filled 2010-12-06: qty 1
  Filled 2010-12-06 (×2): qty 2
  Filled 2010-12-06: qty 1
  Filled 2010-12-06: qty 2
  Filled 2010-12-06: qty 1

## 2010-12-06 NOTE — Progress Notes (Signed)
ANTIBIOTIC CONSULT NOTE - FOLLOW UP  Pharmacy Consult for Primaxin/Vancomycin Indication: suprapubic cellulitis  Allergies  Allergen Reactions  . Penicillins Hives    Patient Measurements: Height: 5\' 5"  (165.1 cm) Weight: 252 lb 10.4 oz (114.6 kg) IBW/kg (Calculated) : 57  Adjusted Body Weight: 80 kg  Vital Signs: Temp: 99.2 F (37.3 C) (11/10 0600) Temp src: Oral (11/10 0600) BP: 174/80 mmHg (11/10 0600) Pulse Rate: 71  (11/10 0600) Intake/Output from previous day: 11/09 0701 - 11/10 0700 In: 3850 [P.O.:600; I.V.:2850; IV Piggyback:400] Out: 250 [Urine:250] Intake/Output from this shift:    Labs:  [facility] 12/05/10 0444 12/04/10 0435  WBC 11.2* 10.9*  HGB 8.6* 8.6*  PLT 198 159  LABCREA -- --  CREATININE 2.14* 2.35*   Estimated Creatinine Clearance: 35.7 ml/min (by C-G formula based on Cr of 2.14). Normalized CrCl ~63ml/min/1.73m2    Microbiology: Recent Results (from the past 720 hour(s))  CULTURE, BLOOD (ROUTINE X 2)     Status: Normal (Preliminary result)   Collection Time   12/03/10  4:15 PM      Component Value Range Status Comment   Specimen Description BLOOD RIGHT ARM   Final    Special Requests BOTTLES DRAWN AEROBIC AND ANAEROBIC 4CC   Final    Setup Time [id number]   Final    Culture     Final    Value:        BLOOD CULTURE RECEIVED NO GROWTH TO DATE CULTURE WILL BE HELD FOR 5 DAYS BEFORE ISSUING A FINAL NEGATIVE REPORT   Report Status PENDING   Incomplete   CULTURE, BLOOD (ROUTINE X 2)     Status: Normal (Preliminary result)   Collection Time   12/03/10  4:30 PM      Component Value Range Status Comment   Specimen Description BLOOD RIGHT ARM   Final    Special Requests BOTTLES DRAWN AEROBIC AND ANAEROBIC 10CC   Final    Setup Time [id number]   Final    Culture     Final    Value:        BLOOD CULTURE RECEIVED NO GROWTH TO DATE CULTURE WILL BE HELD FOR 5 DAYS BEFORE ISSUING A FINAL NEGATIVE REPORT   Report Status PENDING   Incomplete   URINE  CULTURE     Status: Normal   Collection Time   12/04/10  2:13 AM      Component Value Range Status Comment   Specimen Description URINE, CLEAN CATCH   Final    Special Requests avelox Normal   Final    Setup Time [id number]   Final    Colony Count NO GROWTH   Final    Culture NO GROWTH   Final    Report Status 12/05/2010 FINAL   Final     Anti-infectives     Start     Dose/Rate Route Frequency Ordered Stop   12/05/10 0600   vancomycin (VANCOCIN) 1,500 mg in sodium chloride 0.9 % 500 mL IVPB        1,500 mg 250 mL/hr over 120 Minutes Intravenous Every 48 hours 12/04/10 0958     12/03/10 1600   vancomycin (VANCOCIN) IVPB 1000 mg/200 mL premix        1,000 mg 200 mL/hr over 60 Minutes Intravenous  Once 12/03/10 1541 12/03/10 1836   12/03/10 1600   imipenem-cilastatin (PRIMAXIN) 250 mg in sodium chloride 0.9 % 100 mL IVPB        250 mg 200 mL/hr over  30 Minutes Intravenous Every 6 hours 12/03/10 1541     12/02/10 0600   moxifloxacin (AVELOX) IVPB 400 mg  Status:  Discontinued        400 mg 250 mL/hr over 60 Minutes Intravenous Every 24 hours 12/02/10 0532 12/03/10 1511   12/02/10 0000   moxifloxacin (AVELOX) IVPB 400 mg  Status:  Discontinued        400 mg 250 mL/hr over 60 Minutes Intravenous Daily 12/01/10 2328 12/02/10 0532   12/01/10 2215   moxifloxacin (AVELOX) IVPB 400 mg        400 mg 250 mL/hr over 60 Minutes Intravenous  Once 12/01/10 2209 12/01/10 2324   12/01/10 1915   clindamycin (CLEOCIN) IVPB 600 mg        600 mg 100 mL/hr over 30 Minutes Intravenous  Once 12/01/10 1906 12/01/10 2017          Assessment: 60 yo F w/suprapubic cellulitis on: Day #4 Vancomycin 1500mg  IV q48h (dose yesterday 0600, 1g IV given in ER 11/7) and  Day #4 Primaxin 250mg  IV q6h.  Scr yesterday was down. UOP decreasing.   Goal of Therapy:  Vancomycin trough level 10-15 mcg/ml  Plan:  D/C current Vancomycin dose. Check random vancomycin level and serum creatinine tomorrow  morning (0500) and dose based on results. No change to Primaxin dose.   Marcell Anger 12/06/2010,8:41 AM

## 2010-12-06 NOTE — Progress Notes (Signed)
Subjective:  The patient reports that she's feeling about the same. She continues to have discomfort in her pelvic area. She is on antibiotics at this time for cellulitis. She denies chest pains or shortness of breath. She notes her blood sugars are still fluctuating. Patient states she uses oxygen at night for sleep apnea. No other new complaints this time.     Allergies  Allergen Reactions  . Penicillins Hives   Current Facility-Administered Medications  Medication Dose Route Frequency Provider Last Rate Last Dose  . acetaminophen (TYLENOL) tablet 650 mg  650 mg Oral Q6H PRN Mary A. Lynch   650 mg at 12/06/10 [id number]  . amLODipine (NORVASC) tablet 5 mg  5 mg Oral Daily Clent Demark, MD   5 mg at 12/06/10 0942  . aspirin EC tablet 81 mg  81 mg Oral Daily Clent Demark, MD   81 mg at 12/06/10 [id number]  . enoxaparin (LOVENOX) injection 40 mg  40 mg Subcutaneous Q24H Clent Demark, MD   40 mg at 12/06/10 0942  . ferrous sulfate tablet 325 mg  325 mg Oral BID WC Clent Demark, MD   325 mg at 12/06/10 0817  . HYDROcodone-acetaminophen (NORCO) 5-325 MG per tablet 1-2 tablet  1-2 tablet Oral Q8H PRN Clent Demark, MD   2 tablet at 12/06/10 1113  . imipenem-cilastatin (PRIMAXIN) 250 mg in sodium chloride 0.9 % 100 mL IVPB  250 mg Intravenous Q6H Thuyvan Thi Phan, PHARMD   250 mg at 12/06/10 0400  . insulin aspart (novoLOG) injection 0-9 Units  0-9 Units Subcutaneous TID WC Clent Demark, MD   2 Units at 12/06/10 0817  . insulin glargine (LANTUS) injection 40 Units  40 Units Subcutaneous BID Clent Demark, MD   40 Units at 12/06/10 0943  . metoprolol tartrate (LOPRESSOR) tablet 25 mg  25 mg Oral BID Clent Demark, MD   25 mg at 12/06/10 0943  . pantoprazole (PROTONIX) EC tablet 40 mg  40 mg Oral Q0600 Clent Demark, MD   40 mg at 12/06/10 0534  . rosuvastatin (CRESTOR) tablet 10 mg  10 mg Oral q1800 Clent Demark, MD   10 mg at 12/05/10 1737  . sodium chloride 0.9 % IV infusion    Intravenous Continuous Clent Demark, MD 125 mL/hr at 12/06/10 0500    . DISCONTD: acetaminophen (TYLENOL) tablet 650 mg  650 mg Oral Q4H PRN Clent Demark, MD   650 mg at 12/05/10 1017  . DISCONTD: vancomycin (VANCOCIN) 1,500 mg in sodium chloride 0.9 % 500 mL IVPB  1,500 mg Intravenous Q48H Samul Dada, PHARMD        Objective: Blood pressure 174/80, pulse 71, temperature 99.2 F (37.3 C), temperature source Oral, resp. rate 18, height 5\' 5"  (1.651 m), weight 252 lb 10.4 oz (114.6 kg), SpO2 93.00%.  Well-developed obese black female in no acute distress. HEENT: No sinus tenderness. NECK: No posterior cervical nodes. LUNGS: Decreased breath sounds at bases. No wheezes. No vocal fremitus. CV: Normal S1, S2 without S3. ABD: Obese. Bowel sounds present. GU: Indurated area in the right groin region with blister formation. There is an approximate area of 4 cm of induration noted with increased warmth. MSK: Trace edema bilaterally. Negative Homans. NEURO: Nonfocal and intact.  Lab results: Results for orders placed during the hospital encounter of 12/01/10 (from the past 48 hour(s))  GLUCOSE, CAPILLARY     Status: Abnormal   Collection Time  12/04/10  5:09 PM      Component Value Range Comment   Glucose-Capillary 247 (*) 70 - 99 (mg/dL)   GLUCOSE, CAPILLARY     Status: Abnormal   Collection Time   12/04/10  9:04 PM      Component Value Range Comment   Glucose-Capillary 212 (*) 70 - 99 (mg/dL)   CBC     Status: Abnormal   Collection Time   12/05/10  4:44 AM      Component Value Range Comment   WBC 11.2 (*) 4.0 - 10.5 (K/uL)    RBC 2.89 (*) 3.87 - 5.11 (MIL/uL)    Hemoglobin 8.6 (*) 12.0 - 15.0 (g/dL)    HCT 26.5 (*) 36.0 - 46.0 (%)    MCV 91.7  78.0 - 100.0 (fL)    MCH 29.8  26.0 - 34.0 (pg)    MCHC 32.5  30.0 - 36.0 (g/dL)    RDW 15.6 (*) 11.5 - 15.5 (%)    Platelets 198  150 - 400 (K/uL)   DIFFERENTIAL     Status: Abnormal   Collection Time   12/05/10  4:44 AM       Component Value Range Comment   Neutrophils Relative 68  43 - 77 (%)    Lymphocytes Relative 10 (*) 12 - 46 (%)    Monocytes Relative 13 (*) 3 - 12 (%)    Eosinophils Relative 1  0 - 5 (%)    Basophils Relative 0  0 - 1 (%)    Band Neutrophils 8  0 - 10 (%)    Neutro Abs 8.5 (*) 1.7 - 7.7 (K/uL)    Lymphs Abs 1.1  0.7 - 4.0 (K/uL)    Monocytes Absolute 1.5 (*) 0.1 - 1.0 (K/uL)    Eosinophils Absolute 0.1  0.0 - 0.7 (K/uL)    Basophils Absolute 0.0  0.0 - 0.1 (K/uL)    RBC Morphology TARGET CELLS      WBC Morphology MILD LEFT SHIFT (1-5% METAS, OCC MYELO, OCC BANDS)      Smear Review PLATELET COUNT CONFIRMED BY SMEAR     COMPREHENSIVE METABOLIC PANEL     Status: Abnormal   Collection Time   12/05/10  4:44 AM      Component Value Range Comment   Sodium 137  135 - 145 (mEq/L)    Potassium 3.5  3.5 - 5.1 (mEq/L)    Chloride 106  96 - 112 (mEq/L)    CO2 20  19 - 32 (mEq/L)    Glucose, Bld 229 (*) 70 - 99 (mg/dL)    BUN 26 (*) 6 - 23 (mg/dL)    Creatinine, Ser 2.14 (*) 0.50 - 1.10 (mg/dL)    Calcium 9.1  8.4 - 10.5 (mg/dL)    Total Protein 6.8  6.0 - 8.3 (g/dL)    Albumin 2.1 (*) 3.5 - 5.2 (g/dL)    AST 25  0 - 37 (U/L)    ALT 16  0 - 35 (U/L)    Alkaline Phosphatase 224 (*) 39 - 117 (U/L)    Total Bilirubin 0.4  0.3 - 1.2 (mg/dL)    GFR calc non Af Amer 24 (*) >90 (mL/min)    GFR calc Af Amer 28 (*) >90 (mL/min)   GLUCOSE, CAPILLARY     Status: Abnormal   Collection Time   12/05/10  7:47 AM      Component Value Range Comment   Glucose-Capillary 218 (*) 70 - 99 (mg/dL)  GLUCOSE, CAPILLARY     Status: Abnormal   Collection Time   12/05/10 11:45 AM      Component Value Range Comment   Glucose-Capillary 187 (*) 70 - 99 (mg/dL)   GLUCOSE, CAPILLARY     Status: Abnormal   Collection Time   12/05/10  5:07 PM      Component Value Range Comment   Glucose-Capillary 238 (*) 70 - 99 (mg/dL)   GLUCOSE, CAPILLARY     Status: Abnormal   Collection Time   12/05/10  9:17 PM      Component  Value Range Comment   Glucose-Capillary 229 (*) 70 - 99 (mg/dL)    Comment 1 Documented in Chart      Comment 2 Notify RN     GLUCOSE, CAPILLARY     Status: Abnormal   Collection Time   12/06/10  7:31 AM      Component Value Range Comment   Glucose-Capillary 158 (*) 70 - 99 (mg/dL)    Comment 1 Notify RN      Comment 2 Documented in Chart      @[email or web address] @  Impression: Patient Active Problem List  Diagnoses  . Cellulitis of pubic region  . Uncontrolled type 2 DM with hyperosmolar nonketotic hyperglycemia   1. Cellulitis of the right groin region and pubic area. 2. Uncontrolled diabetes mellitus. His probably impairing wound healing. 3. Morbid obesity. 4. History of sleep apnea.  Plan: 1. Continue her present regimen. 2. Consider surgical evaluation for drainage if no significant improvement. She is at high risk however for poor healing due to her diabetes. 3. Adjust pain medication regimen for greater relief.   Marlou Sa, Daryle Boyington 12/06/2010

## 2010-12-07 LAB — GLUCOSE, CAPILLARY
Glucose-Capillary: 155 mg/dL — ABNORMAL HIGH (ref 70–99)
Glucose-Capillary: 113 mg/dL — ABNORMAL HIGH (ref 70–99)
Glucose-Capillary: 125 mg/dL — ABNORMAL HIGH (ref 70–99)
Glucose-Capillary: 139 mg/dL — ABNORMAL HIGH (ref 70–99)
Glucose-Capillary: 147 mg/dL — ABNORMAL HIGH (ref 70–99)

## 2010-12-07 LAB — VANCOMYCIN, RANDOM: Vancomycin Rm: <5 ug/mL

## 2010-12-07 LAB — CREATININE, SERUM
Creatinine, Ser: 1.42 mg/dL — ABNORMAL HIGH (ref 0.50–1.10)
GFR calc Af Amer: 46 mL/min — ABNORMAL LOW (ref >90)
GFR calc non Af Amer: 40 mL/min — ABNORMAL LOW (ref >90)

## 2010-12-07 MED ORDER — IMIPENEM-CILASTATIN 500 MG IV SOLR
500.0000 mg | Freq: Three times a day (TID) | INTRAVENOUS | Status: DC
  Administered 2010-12-07 – 2010-12-15 (×24): 500 mg via INTRAVENOUS
  Filled 2010-12-07 (×28): qty 500

## 2010-12-07 MED ORDER — POTASSIUM CHLORIDE ER 10 MEQ PO TBCR
40.0000 meq | EXTENDED_RELEASE_TABLET | Freq: Two times a day (BID) | ORAL | Status: AC
  Administered 2010-12-07: 40 meq via ORAL
  Filled 2010-12-07 (×4): qty 4

## 2010-12-07 MED ORDER — VANCOMYCIN HCL 1000 MG IV SOLR
1500.0000 mg | INTRAVENOUS | Status: DC
  Administered 2010-12-07 – 2010-12-10 (×4): 1500 mg via INTRAVENOUS
  Filled 2010-12-07 (×4): qty 1500

## 2010-12-07 NOTE — Progress Notes (Signed)
Subjective:  The patient reports she's feeling about the same. She still has continued discomfort in the right groin region. She denies any new chest pains or shortness of breath. Her appetite has been fair. No other new complaints. Review of systems otherwise unremarkable. She reports that she's not had a similar problem in the past. The possibility of surgical drainage was discussed with patient as well.      Allergies  Allergen Reactions  . Penicillins Hives   Current Facility-Administered Medications  Medication Dose Route Frequency Provider Last Rate Last Dose  . acetaminophen (TYLENOL) tablet 650 mg  650 mg Oral Q6H PRN Mary A. Lynch   650 mg at 12/07/10 0507  . amLODipine (NORVASC) tablet 5 mg  5 mg Oral Daily Clent Demark, MD   5 mg at 12/07/10 0954  . aspirin EC tablet 81 mg  81 mg Oral Daily Clent Demark, MD   81 mg at 12/07/10 0954  . enoxaparin (LOVENOX) injection 40 mg  40 mg Subcutaneous Q24H Clent Demark, MD   40 mg at 12/07/10 0955  . ferrous sulfate tablet 325 mg  325 mg Oral BID WC Clent Demark, MD   325 mg at 12/07/10 0855  . HYDROcodone-acetaminophen (NORCO) 5-325 MG per tablet 1-2 tablet  1-2 tablet Oral Q6H PRN Kevan Ny, MD   2 tablet at 12/07/10 0855  . imipenem-cilastatin (PRIMAXIN) 500 mg in sodium chloride 0.9 % 100 mL IVPB  500 mg Intravenous Q8H Marcell Anger, PHARMD   500 mg at 12/07/10 1000  . insulin aspart (novoLOG) injection 0-9 Units  0-9 Units Subcutaneous TID WC Clent Demark, MD   1 Units at 12/07/10 1246  . insulin glargine (LANTUS) injection 40 Units  40 Units Subcutaneous BID Clent Demark, MD   40 Units at 12/07/10 0955  . metoprolol tartrate (LOPRESSOR) tablet 25 mg  25 mg Oral BID Clent Demark, MD   25 mg at 12/07/10 0955  . pantoprazole (PROTONIX) EC tablet 40 mg  40 mg Oral Q0600 Clent Demark, MD   40 mg at 12/07/10 0513  . rosuvastatin (CRESTOR) tablet 10 mg  10 mg Oral q1800 Clent Demark, MD   10 mg at 12/06/10 1700    . sodium chloride 0.9 % IV infusion   Intravenous Continuous Clent Demark, MD 125 mL/hr at 12/07/10 0553    . vancomycin (VANCOCIN) 1,500 mg in sodium chloride 0.9 % 500 mL IVPB  1,500 mg Intravenous Q24H Marcell Anger, PHARMD   1,500 mg at 12/07/10 0857  . DISCONTD: imipenem-cilastatin (PRIMAXIN) 250 mg in sodium chloride 0.9 % 100 mL IVPB  250 mg Intravenous Q6H Thuyvan Thi Phan, PHARMD   250 mg at 12/07/10 0313    Objective: Blood pressure 170/83, pulse 78, temperature 99.8 F (37.7 C), temperature source Oral, resp. rate 18, height 5\' 5"  (1.651 m), weight 267 lb 13.7 oz (121.5 kg), SpO2 94.00%.  Well-developed overweight black female presently in no acute distress. HEENT: No sinus tenderness. NECK: No posterior cervical nodes. LUNGS: Decreased breath sounds at bases. No wheezes. No vocal fremitus. CV: Normal S1, S2 without S3. ABD: Nontender. Right groin lesion with persistent induration. Not as tender as before. MSK: Negative Homans. No edema. NEURO: Intact.  Lab results: Results for orders placed during the hospital encounter of 12/01/10 (from the past 48 hour(s))  GLUCOSE, CAPILLARY     Status: Abnormal   Collection Time   12/05/10  5:07 PM  Component Value Range Comment   Glucose-Capillary 238 (*) 70 - 99 (mg/dL)   GLUCOSE, CAPILLARY     Status: Abnormal   Collection Time   12/05/10  9:17 PM      Component Value Range Comment   Glucose-Capillary 229 (*) 70 - 99 (mg/dL)    Comment 1 Documented in Chart      Comment 2 Notify RN     CULTURE, BLOOD (ROUTINE X 2)     Status: Normal (Preliminary result)   Collection Time   12/05/10 10:30 PM      Component Value Range Comment   Specimen Description BLOOD LEFT ARM      Special Requests BOTTLES DRAWN AEROBIC AND ANAEROBIC  5ML      Setup Time [id number]      Culture        Value:        BLOOD CULTURE RECEIVED NO GROWTH TO DATE CULTURE WILL BE HELD FOR 5 DAYS BEFORE ISSUING A FINAL NEGATIVE REPORT   Report Status  PENDING     CULTURE, BLOOD (ROUTINE X 2)     Status: Normal (Preliminary result)   Collection Time   12/05/10 10:35 PM      Component Value Range Comment   Specimen Description BLOOD LEFT WRIST      Special Requests BOTTLES DRAWN AEROBIC AND ANAEROBIC  1ML      Setup Time [id number]      Culture        Value:        BLOOD CULTURE RECEIVED NO GROWTH TO DATE CULTURE WILL BE HELD FOR 5 DAYS BEFORE ISSUING A FINAL NEGATIVE REPORT   Report Status PENDING     GLUCOSE, CAPILLARY     Status: Abnormal   Collection Time   12/06/10  7:31 AM      Component Value Range Comment   Glucose-Capillary 158 (*) 70 - 99 (mg/dL)    Comment 1 Notify RN      Comment 2 Documented in Chart     GLUCOSE, CAPILLARY     Status: Abnormal   Collection Time   12/06/10 12:16 PM      Component Value Range Comment   Glucose-Capillary 138 (*) 70 - 99 (mg/dL)    Comment 1 Notify RN      Comment 2 Documented in Chart     CBC     Status: Abnormal   Collection Time   12/06/10  2:25 PM      Component Value Range Comment   WBC 12.1 (*) 4.0 - 10.5 (K/uL)    RBC 2.67 (*) 3.87 - 5.11 (MIL/uL)    Hemoglobin 8.2 (*) 12.0 - 15.0 (g/dL)    HCT 24.2 (*) 36.0 - 46.0 (%)    MCV 90.6  78.0 - 100.0 (fL)    MCH 30.7  26.0 - 34.0 (pg)    MCHC 33.9  30.0 - 36.0 (g/dL)    RDW 15.4  11.5 - 15.5 (%)    Platelets 231  150 - 400 (K/uL)   DIFFERENTIAL     Status: Abnormal   Collection Time   12/06/10  2:25 PM      Component Value Range Comment   Neutrophils Relative 65  43 - 77 (%)    Lymphocytes Relative 22  12 - 46 (%)    Monocytes Relative 10  3 - 12 (%)    Eosinophils Relative 2  0 - 5 (%)    Basophils Relative 1  0 -  1 (%)    Neutro Abs 7.9 (*) 1.7 - 7.7 (K/uL)    Lymphs Abs 2.7  0.7 - 4.0 (K/uL)    Monocytes Absolute 1.2 (*) 0.1 - 1.0 (K/uL)    Eosinophils Absolute 0.2  0.0 - 0.7 (K/uL)    Basophils Absolute 0.1  0.0 - 0.1 (K/uL)    RBC Morphology POLYCHROMASIA PRESENT   TARGET CELLS   WBC Morphology MILD LEFT SHIFT  (1-5% METAS, OCC MYELO, OCC BANDS)   ATYPICAL LYMPHOCYTES   Smear Review PLATELET CLUMPS NOTED ON SMEAR     COMPREHENSIVE METABOLIC PANEL     Status: Abnormal   Collection Time   12/06/10  2:25 PM      Component Value Range Comment   Sodium 139  135 - 145 (mEq/L)    Potassium 3.2 (*) 3.5 - 5.1 (mEq/L)    Chloride 107  96 - 112 (mEq/L)    CO2 20  19 - 32 (mEq/L)    Glucose, Bld 129 (*) 70 - 99 (mg/dL)    BUN 19  6 - 23 (mg/dL)    Creatinine, Ser 1.59 (*) 0.50 - 1.10 (mg/dL)    Calcium 9.2  8.4 - 10.5 (mg/dL)    Total Protein 6.6  6.0 - 8.3 (g/dL)    Albumin 2.0 (*) 3.5 - 5.2 (g/dL)    AST 18  0 - 37 (U/L)    ALT 12  0 - 35 (U/L)    Alkaline Phosphatase 215 (*) 39 - 117 (U/L)    Total Bilirubin 0.3  0.3 - 1.2 (mg/dL)    GFR calc non Af Amer 34 (*) >90 (mL/min)    GFR calc Af Amer 40 (*) >90 (mL/min)   GLUCOSE, CAPILLARY     Status: Abnormal   Collection Time   12/06/10  4:54 PM      Component Value Range Comment   Glucose-Capillary 133 (*) 70 - 99 (mg/dL)   CREATININE, SERUM     Status: Abnormal   Collection Time   12/07/10  5:35 AM      Component Value Range Comment   Creatinine, Ser 1.42 (*) 0.50 - 1.10 (mg/dL)    GFR calc non Af Amer 40 (*) >90 (mL/min)    GFR calc Af Amer 46 (*) >90 (mL/min)   VANCOMYCIN, RANDOM     Status: Normal   Collection Time   12/07/10  5:35 AM      Component Value Range Comment   Vancomycin Rm <5.0     GLUCOSE, CAPILLARY     Status: Abnormal   Collection Time   12/07/10  7:39 AM      Component Value Range Comment   Glucose-Capillary 113 (*) 70 - 99 (mg/dL)   GLUCOSE, CAPILLARY     Status: Abnormal   Collection Time   12/07/10 11:41 AM      Component Value Range Comment   Glucose-Capillary 125 (*) 70 - 99 (mg/dL)    Comment 1 Notify RN      Comment 2 Documented in Chart      @[email or web address] @  Patient Active Problem List  Diagnoses  . Cellulitis of pubic region  . Uncontrolled type 2 DM with hyperosmolar nonketotic hyperglycemia     Impression: 1. Cellulitis right pubic region. Rule out abscess. 2. Hypokalemia. This is replacement. 3. Uncontrolled diabetes mellitus. This is improving but control is being complicated by underlying infection. 4. Hypertension. Blood pressure continues to fluctuate.   Plan: 1. Continue IV antibiotics. She may  require excision of remaining core lesion after several days antibiotics. 2. Continue insulin therapy is necessary medication adjustment. 3. Correct her hypokalemia. 4. CBC, CMET in a.m. 5. Incentive spirometer.   Marlou Sa, Chaun Uemura 12/07/2010

## 2010-12-07 NOTE — Progress Notes (Addendum)
ANTIBIOTIC CONSULT NOTE - FOLLOW UP  Pharmacy Consult for Primaxin/Vancomycin Indication: suprapubic cellulitis  Allergies  Allergen Reactions  . Penicillins Hives    Patient Measurements: Height: 5\' 5"  (165.1 cm) Weight: 267 lb 13.7 oz (121.5 kg) IBW/kg (Calculated) : 57  Adjusted Body Weight: 80 kg  Vital Signs: Temp: 98.5 F (36.9 C) (11/11 0549) Temp src: Oral (11/11 0549) BP: 170/83 mmHg (11/11 0549) Pulse Rate: 76  (11/11 0549) Intake/Output from previous day: 11/10 0701 - 11/11 0700 In: [id number] [P.O.:840; I.V.:750; IV Piggyback:200] Out: 700 [Urine:700]     Labs:  [facility] 12/07/10 0535 12/06/10 1425 12/05/10 0444  WBC -- 12.1* 11.2*  HGB -- 8.2* 8.6*  PLT -- 231 198  LABCREA -- -- --  CREATININE 1.42* 1.59* 2.14*   Estimated Creatinine Clearance: 55.8 ml/min (by C-G formula based on Cr of 1.42). Normalized CrCl ~65ml/min/1.73m2    Microbiology: Recent Results (from the past 720 hour(s))  CULTURE, BLOOD (ROUTINE X 2)     Status: Normal (Preliminary result)   Collection Time   12/03/10  4:15 PM      Component Value Range Status Comment   Specimen Description BLOOD RIGHT ARM   Final    Special Requests BOTTLES DRAWN AEROBIC AND ANAEROBIC 4CC   Final    Setup Time [id number]   Final    Culture     Final    Value:        BLOOD CULTURE RECEIVED NO GROWTH TO DATE CULTURE WILL BE HELD FOR 5 DAYS BEFORE ISSUING A FINAL NEGATIVE REPORT   Report Status PENDING   Incomplete   CULTURE, BLOOD (ROUTINE X 2)     Status: Normal (Preliminary result)   Collection Time   12/03/10  4:30 PM      Component Value Range Status Comment   Specimen Description BLOOD RIGHT ARM   Final    Special Requests BOTTLES DRAWN AEROBIC AND ANAEROBIC 10CC   Final    Setup Time [id number]   Final    Culture     Final    Value:        BLOOD CULTURE RECEIVED NO GROWTH TO DATE CULTURE WILL BE HELD FOR 5 DAYS BEFORE ISSUING A FINAL NEGATIVE REPORT   Report Status PENDING   Incomplete     URINE CULTURE     Status: Normal   Collection Time   12/04/10  2:13 AM      Component Value Range Status Comment   Specimen Description URINE, CLEAN CATCH   Final    Special Requests avelox Normal   Final    Setup Time [id number]   Final    Colony Count NO GROWTH   Final    Culture NO GROWTH   Final    Report Status 12/05/2010 FINAL   Final     Anti-infectives     Start     Dose/Rate Route Frequency Ordered Stop   12/07/10 0800   vancomycin (VANCOCIN) 1,500 mg in sodium chloride 0.9 % 500 mL IVPB        1,500 mg 250 mL/hr over 120 Minutes Intravenous Every 24 hours 12/07/10 0714     12/05/10 0600   vancomycin (VANCOCIN) 1,500 mg in sodium chloride 0.9 % 500 mL IVPB  Status:  Discontinued        1,500 mg 250 mL/hr over 120 Minutes Intravenous Every 48 hours 12/04/10 0958 12/06/10 0846   12/03/10 1600   vancomycin (VANCOCIN) IVPB 1000 mg/200 mL premix  1,000 mg 200 mL/hr over 60 Minutes Intravenous  Once 12/03/10 1541 12/03/10 1836   12/03/10 1600   imipenem-cilastatin (PRIMAXIN) 250 mg in sodium chloride 0.9 % 100 mL IVPB        250 mg 200 mL/hr over 30 Minutes Intravenous Every 6 hours 12/03/10 1541     12/02/10 0600   moxifloxacin (AVELOX) IVPB 400 mg  Status:  Discontinued        400 mg 250 mL/hr over 60 Minutes Intravenous Every 24 hours 12/02/10 0532 12/03/10 1511   12/02/10 0000   moxifloxacin (AVELOX) IVPB 400 mg  Status:  Discontinued        400 mg 250 mL/hr over 60 Minutes Intravenous Daily 12/01/10 2328 12/02/10 0532   12/01/10 2215   moxifloxacin (AVELOX) IVPB 400 mg        400 mg 250 mL/hr over 60 Minutes Intravenous  Once 12/01/10 2209 12/01/10 2324   12/01/10 1915   clindamycin (CLEOCIN) IVPB 600 mg        600 mg 100 mL/hr over 30 Minutes Intravenous  Once 12/01/10 1906 12/01/10 2017          Assessment: 60 yo F w/suprapubic cellulitis on: Day #5 Vancomycin 1500mg  IV q48h Day #5 Primaxin 250mg  IV q6h.  Scr continues to trend down. UOP  improved. Vancomycin level this am <95mcg/ml  Goal of Therapy:  Vancomycin trough level 10-15 mcg/ml  Plan:  Resume Vancomycin @ increased dose 1500mg  IV q24h. Continue to monitor renal function, repeat levels as appropriate and adjust as necessary. Vancomycin (Primaxin) duration per MD.  Marcell Anger 12/07/2010,7:14 AM  Addendum: Will also change Primaxin to 500mg  IV q8h given improvement in Scr.  Marcell Anger 7:25 AM 12/07/2010

## 2010-12-08 DIAGNOSIS — Z9989 Dependence on other enabling machines and devices: Secondary | ICD-10-CM | POA: Diagnosis present

## 2010-12-08 DIAGNOSIS — G4733 Obstructive sleep apnea (adult) (pediatric): Secondary | ICD-10-CM | POA: Diagnosis present

## 2010-12-08 DIAGNOSIS — I2510 Atherosclerotic heart disease of native coronary artery without angina pectoris: Secondary | ICD-10-CM | POA: Diagnosis present

## 2010-12-08 DIAGNOSIS — L02219 Cutaneous abscess of trunk, unspecified: Secondary | ICD-10-CM

## 2010-12-08 DIAGNOSIS — L03319 Cellulitis of trunk, unspecified: Secondary | ICD-10-CM

## 2010-12-08 LAB — COMPREHENSIVE METABOLIC PANEL
ALT: 8 U/L (ref 0–35)
AST: 12 U/L (ref 0–37)
Albumin: 1.9 g/dL — ABNORMAL LOW (ref 3.5–5.2)
Alkaline Phosphatase: 182 U/L — ABNORMAL HIGH (ref 39–117)
BUN: 13 mg/dL (ref 6–23)
CO2: 20 mEq/L (ref 19–32)
Calcium: 8.6 mg/dL (ref 8.4–10.5)
Chloride: 108 mEq/L (ref 96–112)
Creatinine, Ser: 1.33 mg/dL — ABNORMAL HIGH (ref 0.50–1.10)
GFR calc Af Amer: 50 mL/min — ABNORMAL LOW (ref >90)
GFR calc non Af Amer: 43 mL/min — ABNORMAL LOW (ref >90)
Glucose, Bld: 86 mg/dL (ref 70–99)
Potassium: 2.6 mEq/L — CL (ref 3.5–5.1)
Sodium: 140 mEq/L (ref 135–145)
Total Bilirubin: 0.3 mg/dL (ref 0.3–1.2)
Total Protein: 6.7 g/dL (ref 6.0–8.3)

## 2010-12-08 LAB — DIFFERENTIAL
Band Neutrophils: 2 % (ref 0–10)
Basophils Absolute: 0 10*3/uL (ref 0.0–0.1)
Basophils Relative: 0 % (ref 0–1)
Eosinophils Absolute: 0 10*3/uL (ref 0.0–0.7)
Eosinophils Relative: 0 % (ref 0–5)
Lymphocytes Relative: 26 % (ref 12–46)
Lymphs Abs: 3.5 10*3/uL (ref 0.7–4.0)
Monocytes Absolute: 0.8 10*3/uL (ref 0.1–1.0)
Monocytes Relative: 6 % (ref 3–12)
Myelocytes: 1 %
Neutro Abs: 9.1 10*3/uL — ABNORMAL HIGH (ref 1.7–7.7)
Neutrophils Relative %: 65 % (ref 43–77)

## 2010-12-08 LAB — CBC
HCT: 24.4 % — ABNORMAL LOW (ref 36.0–46.0)
Hemoglobin: 8 g/dL — ABNORMAL LOW (ref 12.0–15.0)
MCH: 29.7 pg (ref 26.0–34.0)
MCHC: 32.8 g/dL (ref 30.0–36.0)
MCV: 90.7 fL (ref 78.0–100.0)
Platelets: 318 10*3/uL (ref 150–400)
RBC: 2.69 MIL/uL — ABNORMAL LOW (ref 3.87–5.11)
RDW: 15.7 % — ABNORMAL HIGH (ref 11.5–15.5)
WBC: 13.4 10*3/uL — ABNORMAL HIGH (ref 4.0–10.5)

## 2010-12-08 LAB — GLUCOSE, CAPILLARY
Glucose-Capillary: 97 mg/dL (ref 70–99)
Glucose-Capillary: 134 mg/dL — ABNORMAL HIGH (ref 70–99)
Glucose-Capillary: 91 mg/dL (ref 70–99)

## 2010-12-08 LAB — POTASSIUM: Potassium: 3.1 mEq/L — ABNORMAL LOW (ref 3.5–5.1)

## 2010-12-08 MED ORDER — POTASSIUM CHLORIDE CRYS ER 20 MEQ PO TBCR
40.0000 meq | EXTENDED_RELEASE_TABLET | Freq: Three times a day (TID) | ORAL | Status: DC
  Administered 2010-12-08 (×2): 40 meq via ORAL
  Filled 2010-12-08 (×27): qty 2

## 2010-12-08 MED ORDER — POTASSIUM CHLORIDE 10 MEQ/100ML IV SOLN
10.0000 meq | INTRAVENOUS | Status: AC
  Administered 2010-12-08 (×2): 10 meq via INTRAVENOUS
  Filled 2010-12-08 (×2): qty 100

## 2010-12-08 NOTE — Consult Note (Signed)
Michelle Holmes is an 60 y.o. female.   Primary Care MD: Terrence Dupont Chief Complaint: Perineal/labial abscess HPI: The patient is a 60 year old African American female who was admitted with fever malaise and chills on 12/01/2010 by Dr. Terrence Dupont. The patient has a history of uncontrolled blood sugars in multiple medical problems as listed below. In the ER she was noted to have a high grade fever and a blood sugar greater than 450. She currently is on Primaxin IV along with vancomycin IV. She reports that there has actually been some improvement and there has been some drainage from the right perineum. She reports that she's had pain and tenderness and intermittent drainage from this site for 2 weeks.. The patient continues to have pain and swelling in the right perineum and we were asked to see the patient in consultation. Past Medical History  Diagnosis Date  . Emphysema   . Colon cancer   . Hypertension   . Hypercholesteremia   . Diabetes mellitus   . COPD (chronic obstructive pulmonary disease)   . Coronary artery disease   . Stroke   sleep apnea History of diastolic congestive heart failure History of chronic renal insufficiency Morbid obesity History TIA  History reviewed. No pertinent past surgical history. Surgery for colon cancer 2002 Dr. Jeanella Anton. FAMILY HISTORY; noncontributory No family history on file. Social History:  reports that she has quit smoking. She does not have any smokeless tobacco history on file. She reports that she does not drink alcohol or use illicit drugs. she is on disability and does not work.  Allergies:  Allergies  Allergen Reactions  . Penicillins Hives    Medications Prior to Admission  Medication Dose Route Frequency Provider Last Rate Last Dose  . 0.9 %  sodium chloride infusion   Intravenous Once Lezlie Octave, MD 125 mL/hr at 12/01/10 2100    . acetaminophen (TYLENOL) tablet 650 mg  650 mg Oral Once Lezlie Octave, MD   650 mg at  12/01/10 1615  . acetaminophen (TYLENOL) tablet 650 mg  650 mg Oral Q6H PRN Mary A. Lynch   650 mg at 12/08/10 0313  . amLODipine (NORVASC) tablet 5 mg  5 mg Oral Daily Clent Demark, MD   5 mg at 12/08/10 0835  . aspirin EC tablet 81 mg  81 mg Oral Daily Clent Demark, MD   81 mg at 12/08/10 0841  . clindamycin (CLEOCIN) IVPB 600 mg  600 mg Intravenous Once Lezlie Octave, MD   600 mg at 12/01/10 1947  . dextrose 5 % 250 mL with magnesium sulfate 4 g infusion   Intravenous Once Clent Demark, MD 125 mL/hr at 12/02/10 1123    . enoxaparin (LOVENOX) injection 40 mg  40 mg Subcutaneous Q24H Clent Demark, MD   40 mg at 12/08/10 0839  . ferrous sulfate tablet 325 mg  325 mg Oral BID WC Clent Demark, MD   325 mg at 12/08/10 0837  . HYDROcodone-acetaminophen (NORCO) 5-325 MG per tablet 1-2 tablet  1-2 tablet Oral Q6H PRN Kevan Ny, MD   1 tablet at 12/08/10 1234  . imipenem-cilastatin (PRIMAXIN) 500 mg in sodium chloride 0.9 % 100 mL IVPB  500 mg Intravenous Q8H Marcell Anger, PHARMD   500 mg at 12/08/10 1542  . insulin aspart (novoLOG) 100 UNIT/ML injection        12 Units at 12/01/10 1945  . insulin aspart (novoLOG) 100 UNIT/ML injection           .  insulin aspart (novoLOG) injection 0-9 Units  0-9 Units Subcutaneous TID WC Clent Demark, MD   1 Units at 12/08/10 1229  . insulin aspart (novoLOG) injection 12 Units  12 Units Subcutaneous Once Leighton Parody, PHARMD   12 Units at 12/01/10 1946  . insulin glargine (LANTUS) injection 40 Units  40 Units Subcutaneous BID Clent Demark, MD   40 Units at 12/08/10 1006  . metoprolol tartrate (LOPRESSOR) tablet 25 mg  25 mg Oral BID Clent Demark, MD   25 mg at 12/08/10 0836  . moxifloxacin (AVELOX) IVPB 400 mg  400 mg Intravenous Once Clent Demark, MD   400 mg at 12/01/10 2224  . ondansetron (ZOFRAN) injection 4 mg  4 mg Intravenous Once Lezlie Octave, MD   4 mg at 12/01/10 1615  . pantoprazole (PROTONIX) EC tablet 40 mg  40 mg  Oral Q0600 Clent Demark, MD   40 mg at 12/08/10 0536  . potassium chloride (K-DUR) CR tablet 40 mEq  40 mEq Oral BID Kevan Ny, MD   40 mEq at 12/07/10 1430  . potassium chloride 10 mEq in 100 mL IVPB  10 mEq Intravenous Q1 Hr x 2 Kevan Ny, MD   10 mEq at 12/08/10 0955  . potassium chloride SA (K-DUR,KLOR-CON) CR tablet 40 mEq  40 mEq Oral TID Clent Demark, MD   40 mEq at 12/08/10 1545  . rosuvastatin (CRESTOR) tablet 10 mg  10 mg Oral q1800 Clent Demark, MD   10 mg at 12/07/10 1722  . sodium chloride 0.9 % bolus 1,000 mL  1,000 mL Intravenous Once Lezlie Octave, MD   1,000 mL at 12/01/10 1615  . sodium chloride 0.9 % bolus 1,000 mL  1,000 mL Intravenous Once Lezlie Octave, MD   1,000 mL at 12/01/10 1800  . sodium chloride 0.9 % IV infusion   Intravenous Continuous Clent Demark, MD 125 mL/hr at 12/08/10 1400    . vancomycin (VANCOCIN) 1,500 mg in sodium chloride 0.9 % 500 mL IVPB  1,500 mg Intravenous Q24H Marcell Anger, PHARMD   1,500 mg at 12/08/10 1109  . vancomycin (VANCOCIN) IVPB 1000 mg/200 mL premix  1,000 mg Intravenous Once Leighton Parody, PHARMD   1,000 mg at 12/03/10 1736  . DISCONTD: 0.45 % sodium chloride infusion   Intravenous Continuous Clent Demark, MD 75 mL/hr at 12/03/10 0734    . DISCONTD: acetaminophen (TYLENOL) tablet 650 mg  650 mg Oral Q6H PRN Clent Demark, MD   650 mg at 12/03/10 2004  . DISCONTD: acetaminophen (TYLENOL) tablet 650 mg  650 mg Oral Q4H PRN Clent Demark, MD   650 mg at 12/05/10 1017  . DISCONTD: clopidogrel (PLAVIX) tablet 75 mg  75 mg Oral Daily Clent Demark, MD   75 mg at 12/04/10 0825  . DISCONTD: HYDROcodone-acetaminophen (NORCO) 5-325 MG per tablet 1-2 tablet  1-2 tablet Oral Q8H PRN Clent Demark, MD   2 tablet at 12/06/10 1113  . DISCONTD: imipenem-cilastatin (PRIMAXIN) 250 mg in sodium chloride 0.9 % 100 mL IVPB  250 mg Intravenous Q6H Thuyvan Thi Phan, PHARMD   250 mg at 12/07/10 0313  . DISCONTD: insulin aspart  (novoLOG) injection 12 Units  12 Units Subcutaneous Once Dorrene German, PHARMD      . DISCONTD: insulin glargine (LANTUS) injection 30 Units  30 Units Subcutaneous QAM Clent Demark, MD   30 Units at 12/02/10 0744  .  DISCONTD: insulin glargine (LANTUS) injection 35 Units  35 Units Subcutaneous BID Clent Demark, MD   35 Units at 12/03/10 1000  . DISCONTD: insulin regular (HUMULIN R,NOVOLIN R) 100 units/mL injection 12 Units  12 Units Subcutaneous Once Lezlie Octave, MD      . DISCONTD: insulin regular (HUMULIN R,NOVOLIN R) 100 units/mL injection 12 Units  12 Units Subcutaneous Once Lezlie Octave, MD   12 Units at 12/01/10 2100  . DISCONTD: losartan (COZAAR) tablet 100 mg  100 mg Oral Daily Clent Demark, MD      . DISCONTD: magnesium bolus via infusion 4 g  4 g Intravenous Once Clent Demark, MD      . DISCONTD: magnesium bolus via infusion 4 g  4 g Intravenous Once Clent Demark, MD      . DISCONTD: magnesium sulfate IVPB 4 g 100 mL  4 g Intravenous Once Clent Demark, MD      . DISCONTD: metoprolol tartrate (LOPRESSOR) tablet 25 mg  25 mg Oral BID Clent Demark, MD      . DISCONTD: moxifloxacin (AVELOX) IVPB 400 mg  400 mg Intravenous Q2000 Clent Demark, MD      . DISCONTD: moxifloxacin (AVELOX) IVPB 400 mg  400 mg Intravenous Q24H Clent Demark, MD   400 mg at 12/03/10 0542  . DISCONTD: vancomycin (VANCOCIN) 1,500 mg in sodium chloride 0.9 % 500 mL IVPB  1,500 mg Intravenous Q48H Samul Dada, PHARMD       No current outpatient prescriptions on file as of 12/08/2010.    ROS: No change since admission.    Blood pressure 131/74, pulse 71, temperature 99.4 F (37.4 C), temperature source Oral, resp. rate 19, height 5\' 5"  (1.651 m), weight 121.4 kg (267 lb 10.2 oz), SpO2 96.00%. @PHYSEXAMBYAGE2 @   Results for orders placed during the hospital encounter of 12/01/10 (from the past 48 hour(s))  GLUCOSE, CAPILLARY     Status: Abnormal   Collection Time    12/06/10  9:17 PM      Component Value Range Comment   Glucose-Capillary 155 (*) 70 - 99 (mg/dL)    Comment 1 Notify RN     CREATININE, SERUM     Status: Abnormal   Collection Time   12/07/10  5:35 AM      Component Value Range Comment   Creatinine, Ser 1.42 (*) 0.50 - 1.10 (mg/dL)    GFR calc non Af Amer 40 (*) >90 (mL/min)    GFR calc Af Amer 46 (*) >90 (mL/min)   VANCOMYCIN, RANDOM     Status: Normal   Collection Time   12/07/10  5:35 AM      Component Value Range Comment   Vancomycin Rm <5.0     GLUCOSE, CAPILLARY     Status: Abnormal   Collection Time   12/07/10  7:39 AM      Component Value Range Comment   Glucose-Capillary 113 (*) 70 - 99 (mg/dL)   GLUCOSE, CAPILLARY     Status: Abnormal   Collection Time   12/07/10 11:41 AM      Component Value Range Comment   Glucose-Capillary 125 (*) 70 - 99 (mg/dL)    Comment 1 Notify RN      Comment 2 Documented in Chart     GLUCOSE, CAPILLARY     Status: Abnormal   Collection Time   12/07/10  5:11 PM      Component Value Range Comment  Glucose-Capillary 139 (*) 70 - 99 (mg/dL)    Comment 1 Notify RN      Comment 2 Documented in Chart     GLUCOSE, CAPILLARY     Status: Abnormal   Collection Time   12/07/10  9:09 PM      Component Value Range Comment   Glucose-Capillary 147 (*) 70 - 99 (mg/dL)    Comment 1 Notify RN     COMPREHENSIVE METABOLIC PANEL     Status: Abnormal   Collection Time   12/08/10  5:15 AM      Component Value Range Comment   Sodium 140  135 - 145 (mEq/L)    Potassium 2.6 (*) 3.5 - 5.1 (mEq/L)    Chloride 108  96 - 112 (mEq/L)    CO2 20  19 - 32 (mEq/L)    Glucose, Bld 86  70 - 99 (mg/dL)    BUN 13  6 - 23 (mg/dL)    Creatinine, Ser 1.33 (*) 0.50 - 1.10 (mg/dL)    Calcium 8.6  8.4 - 10.5 (mg/dL)    Total Protein 6.7  6.0 - 8.3 (g/dL)    Albumin 1.9 (*) 3.5 - 5.2 (g/dL)    AST 12  0 - 37 (U/L)    ALT 8  0 - 35 (U/L)    Alkaline Phosphatase 182 (*) 39 - 117 (U/L)    Total Bilirubin 0.3  0.3 - 1.2  (mg/dL)    GFR calc non Af Amer 43 (*) >90 (mL/min)    GFR calc Af Amer 50 (*) >90 (mL/min)   CBC     Status: Abnormal   Collection Time   12/08/10  5:15 AM      Component Value Range Comment   WBC 13.4 (*) 4.0 - 10.5 (K/uL)    RBC 2.69 (*) 3.87 - 5.11 (MIL/uL)    Hemoglobin 8.0 (*) 12.0 - 15.0 (g/dL)    HCT 24.4 (*) 36.0 - 46.0 (%)    MCV 90.7  78.0 - 100.0 (fL)    MCH 29.7  26.0 - 34.0 (pg)    MCHC 32.8  30.0 - 36.0 (g/dL)    RDW 15.7 (*) 11.5 - 15.5 (%)    Platelets 318  150 - 400 (K/uL)   DIFFERENTIAL     Status: Abnormal   Collection Time   12/08/10  5:15 AM      Component Value Range Comment   Neutrophils Relative 65  43 - 77 (%)    Lymphocytes Relative 26  12 - 46 (%)    Monocytes Relative 6  3 - 12 (%)    Eosinophils Relative 0  0 - 5 (%)    Basophils Relative 0  0 - 1 (%)    Band Neutrophils 2  0 - 10 (%)    Myelocytes 1      Neutro Abs 9.1 (*) 1.7 - 7.7 (K/uL)    Lymphs Abs 3.5  0.7 - 4.0 (K/uL)    Monocytes Absolute 0.8  0.1 - 1.0 (K/uL)    Eosinophils Absolute 0.0  0.0 - 0.7 (K/uL)    Basophils Absolute 0.0  0.0 - 0.1 (K/uL)    RBC Morphology POLYCHROMASIA PRESENT      WBC Morphology MILD LEFT SHIFT (1-5% METAS, OCC MYELO, OCC BANDS)   ATYPICAL LYMPHOCYTES   Smear Review PLATELET COUNT CONFIRMED BY SMEAR     GLUCOSE, CAPILLARY     Status: Normal   Collection Time   12/08/10  7:56  AM      Component Value Range Comment   Glucose-Capillary 97  70 - 99 (mg/dL)   POTASSIUM     Status: Abnormal   Collection Time   12/08/10 11:22 AM      Component Value Range Comment   Potassium 3.1 (*) 3.5 - 5.1 (mEq/L)   GLUCOSE, CAPILLARY     Status: Abnormal   Collection Time   12/08/10 11:52 AM      Component Value Range Comment   Glucose-Capillary 134 (*) 70 - 99 (mg/dL)   GLUCOSE, CAPILLARY     Status: Normal   Collection Time   12/08/10  4:44 PM      Component Value Range Comment   Glucose-Capillary 91  70 - 99 (mg/dL)    physical exam: Temperature 99.3 heart rate  71 blood pressure 131/74 respiratory rate 20 sats 96% on room air. Head: Normocephalic HEENT: Pupils equal reactive to light sclerae clear. Neck: No JVD no bruits no thyromegaly. Chest: Anterior chest is clear to auscultation breath sounds are diminished in the bases. Cardiac: Normal S1 and S2 no murmurs or rubs heard. Abdomen: Obese nontender. Positive bowel sounds. No palpable hepatosplenomegaly. Perineum: There is swelling slightly fluctuant, erythematous area in the right perineum. This appears to been draining in the past. At the time of exam it's not. GU/rectal deferred. Extremities: No clubbing cyanosis, mild lower extremity edema. No femoral adenopathy could not be palpated. Neurologic: No focal deficits, cranial nerves II through XII grossly intact. Psych: Normal affect.  Impression: 1. Cellulitis right perineum. 2. Uncontrolled diabetes. 3. Coronary artery disease with history of PTCA stent to the LAD. 4. History of diastolic congestive heart failure. 5. Hypertension. 6. History of chronic renal insufficiency. 7. Chronic anemia. 8. Morbid obesity BMI 44. 9. Sleep apnea she does not have a CPAP machine. Next number degenerative joint disease and scoliosis. 10. History of TIA in the past. 11. Hypokalemia. 12. Anemia  Plan: Patient's been seen and evaluated and examined by Dr. Hassell Done. It is his opinion that we should apply heat, continue antibiotics, continue cleaning the area with Hibiclens. It is his opinion that the site has been draining. He would like to encourage self draining rather than taking her to the OR because of her multiple medical issues. We will order warm soaks and hydrotherapy. We can either place her in a tub or shower this area using warm water. We will continue to follow with you. This is will [place] for Dr. Johnathan Hausen thank you.       JENNINGS,WILLARD 12/08/2010, 5:13 PM

## 2010-12-08 NOTE — Progress Notes (Signed)
Subjective:  Patient states the pain in suprapubic region has improved and is able to walk now. Denies any fever or chills denies any urinary complaints Denies chest pain or shortness of breath.  Objective:  Vital Signs in the last 24 hours: Temp:  [98.2 F (36.8 C)-100.7 F (38.2 C)] 98.2 F (36.8 C) (11/12 0548) Pulse Rate:  [58-79] 58  (11/12 0548) Resp:  [18] 18  (11/12 0548) BP: (166-175)/(78-94) 175/94 mmHg (11/12 0548) SpO2:  [94 %-97 %] 97 % (11/12 0548) Weight:  [121.4 kg (267 lb 10.2 oz)] 267 lb 10.2 oz (121.4 kg) (11/12 0548)  Intake/Output from previous day: 11/11 0701 - 11/12 0700 In: 1710 [P.O.:960; I.V.:750] Out: 1950 [Urine:1950] Intake/Output from this shift:    Physical Exam: Patient is alert awake oriented x3 in no acute distress Conjunctiva is pink Neck supple no JVD no bruit Lungs clear to auscultation without rhonchi or rales Cardiovascular exam S1-S2 is normal there is no murmur or gallop Abdomen is soft obese bowel sounds are present nontender  Right suprapubic region 6 x 6 cm swelling associated with induration and mild fluctuation present associated with erythema Extremities there is no clubbing cyanosis or edema   Lab Results:  Basename 12/08/10 0515 12/06/10 1425  WBC 13.4* 12.1*  HGB 8.0* 8.2*  PLT 318 231    Basename 12/08/10 0515 12/07/10 0535 12/06/10 1425  NA 140 -- 139  K 2.6* -- 3.2*  CL 108 -- 107  CO2 20 -- 20  GLUCOSE 86 -- 129*  BUN 13 -- 19  CREATININE 1.33* 1.42* --   No results found for this basename: TROPONINI:2,CK,MB:2 in the last 72 hours Hepatic Function Panel  Basename 12/08/10 0515  PROT 6.7  ALBUMIN 1.9*  AST 12  ALT 8  ALKPHOS 182*  BILITOT 0.3  BILIDIR --  IBILI --   No results found for this basename: CHOL in the last 72 hours No results found for this basename: PROTIME in the last 72 hours  Imaging:   Cardiac Studies:  Assessment/Plan:  Cellulitis of the right pubic region rule out  abscess Uncontrolled hypertension Hypokalemia replace potassium per orders Insulin requiring diabetes mellitus Coronary artery disease Morbid obesity Obstructive sleep apnea obesity hypoventilation syndrome Degenerative joint disease Scoliosis Resolving renal insufficiency Hypoalbuminemia. Plan Surgical consult was Dr. Rodney Booze Continue present management Replace potassium Check labs in a.m.   LOS: 7 days    Michelle Holmes N 12/08/2010, 8:06 AM

## 2010-12-08 NOTE — Progress Notes (Signed)
Patient Stated she is not feeling good but unable to specify, did not sleep all night, vitals 160/89, 59,  98.6. Oxygen not on patient, put O2 back on patient and she stated it helped. Dr. Marlou Sa notified as well.

## 2010-12-08 NOTE — Progress Notes (Signed)
CRITICAL VALUE ALERT  Critical value received:  Potassium 2.6  Date of notification:  12/08/10  Time of notification:  0629  Critical value read back:yes  Nurse who received alert:  Sheffield Slider  MD notified (1st page):  Dr. Doylene Canard  Time of first page:  0645  MD notified (2nd page): Dr. Doylene Canard  Time of second page: [phone]  Responding MD:  Dr. Marlou Sa  Time MD responded:  [phone]

## 2010-12-09 LAB — CULTURE, BLOOD (ROUTINE X 2)
Culture  Setup Time: 201211072253
Culture  Setup Time: 201211072253
Culture: NO GROWTH
Culture: NO GROWTH

## 2010-12-09 LAB — BASIC METABOLIC PANEL
BUN: 12 mg/dL (ref 6–23)
CO2: 19 mEq/L (ref 19–32)
Calcium: 8.3 mg/dL — ABNORMAL LOW (ref 8.4–10.5)
Chloride: 107 mEq/L (ref 96–112)
Creatinine, Ser: 1.15 mg/dL — ABNORMAL HIGH (ref 0.50–1.10)
GFR calc Af Amer: 59 mL/min — ABNORMAL LOW (ref >90)
GFR calc non Af Amer: 51 mL/min — ABNORMAL LOW (ref >90)
Glucose, Bld: 117 mg/dL — ABNORMAL HIGH (ref 70–99)
Potassium: 2.8 mEq/L — ABNORMAL LOW (ref 3.5–5.1)
Sodium: 138 mEq/L (ref 135–145)

## 2010-12-09 LAB — GLUCOSE, CAPILLARY
Glucose-Capillary: 107 mg/dL — ABNORMAL HIGH (ref 70–99)
Glucose-Capillary: 100 mg/dL — ABNORMAL HIGH (ref 70–99)
Glucose-Capillary: 95 mg/dL (ref 70–99)
Glucose-Capillary: 116 mg/dL — ABNORMAL HIGH (ref 70–99)

## 2010-12-09 LAB — CBC
HCT: 25 % — ABNORMAL LOW (ref 36.0–46.0)
Hemoglobin: 8.2 g/dL — ABNORMAL LOW (ref 12.0–15.0)
MCH: 30 pg (ref 26.0–34.0)
MCHC: 32.8 g/dL (ref 30.0–36.0)
MCV: 91.6 fL (ref 78.0–100.0)
Platelets: 339 10*3/uL (ref 150–400)
RBC: 2.73 MIL/uL — ABNORMAL LOW (ref 3.87–5.11)
RDW: 16.1 % — ABNORMAL HIGH (ref 11.5–15.5)
WBC: 10.5 10*3/uL (ref 4.0–10.5)

## 2010-12-09 LAB — MAGNESIUM: Magnesium: 1.2 mg/dL — ABNORMAL LOW (ref 1.5–2.5)

## 2010-12-09 MED ORDER — INSULIN GLARGINE 100 UNIT/ML ~~LOC~~ SOLN
35.0000 [IU] | Freq: Two times a day (BID) | SUBCUTANEOUS | Status: DC
Start: 2010-12-09 — End: 2010-12-15
  Administered 2010-12-09 – 2010-12-15 (×13): 35 [IU] via SUBCUTANEOUS
  Filled 2010-12-09 (×2): qty 3

## 2010-12-09 MED ORDER — POTASSIUM CHLORIDE 10 MEQ/100ML IV SOLN
10.0000 meq | INTRAVENOUS | Status: AC
  Administered 2010-12-10 (×4): 10 meq via INTRAVENOUS
  Filled 2010-12-09 (×4): qty 100

## 2010-12-09 NOTE — Progress Notes (Signed)
Subjective: Feel better, thinks heat is helpful. Some drainage per patient.  Objective: Vital signs in last 24 hours: Temp:  [99 F (37.2 C)-99.8 F (37.7 C)] 99 F (37.2 C) (11/13 0557) Pulse Rate:  [71-78] 78  (11/13 0557) Resp:  [18-20] 18  (11/13 0557) BP: (131-184)/(74-82) 149/77 mmHg (11/13 0557) SpO2:  [95 %-96 %] 95 % (11/13 0557) Weight:  [122.5 kg (270 lb 1 oz)] 270 lb 1 oz (122.5 kg) (11/13 0557) Last BM Date: 12/08/10  Intake/Output from previous day: 11/12 0701 - 11/13 0700 In: 3640 [P.O.:840; I.V.:2000; IV Piggyback:800] Out: 1850 [Urine:1850] Intake/Output this shift:    General appearance: alert, cooperative and no distress Incision/Wound:Softer, I don't see any drainage, for shower later this AM   Lab Results:   [facility] 12/09/10 0436 12/08/10 0515  WBC 10.5 13.4*  HGB 8.2* 8.0*  HCT 25.0* 24.4*  PLT 339 318    BMET  Basename 12/09/10 0436 12/08/10 1122 12/08/10 0515  NA 138 -- 140  K 2.8* 3.1* --  CL 107 -- 108  CO2 19 -- 20  GLUCOSE 117* -- 86  BUN 12 -- 13  CREATININE 1.15* -- 1.33*  CALCIUM 8.3* -- 8.6   PT/INR No results found for this basename: LABPROT:2,INR:2 in the last 72 hours   Studies/Results: No results found.  Anti-infectives: Anti-infectives     Start     Dose/Rate Route Frequency Ordered Stop   12/07/10 1000  imipenem-cilastatin (PRIMAXIN) 500 mg in sodium chloride 0.9 % 100 mL IVPB       500 mg 200 mL/hr over 30 Minutes Intravenous 3 times per day 12/07/10 0726     12/07/10 0800   vancomycin (VANCOCIN) 1,500 mg in sodium chloride 0.9 % 500 mL IVPB        1,500 mg 250 mL/hr over 120 Minutes Intravenous Every 24 hours 12/07/10 0714     12/05/10 0600   vancomycin (VANCOCIN) 1,500 mg in sodium chloride 0.9 % 500 mL IVPB  Status:  Discontinued        1,500 mg 250 mL/hr over 120 Minutes Intravenous Every 48 hours 12/04/10 0958 12/06/10 0846   12/03/10 1600   vancomycin (VANCOCIN) IVPB 1000 mg/200 mL premix        1,000 mg 200 mL/hr over 60 Minutes Intravenous  Once 12/03/10 1541 12/03/10 1836   12/03/10 1600   imipenem-cilastatin (PRIMAXIN) 250 mg in sodium chloride 0.9 % 100 mL IVPB  Status:  Discontinued        250 mg 200 mL/hr over 30 Minutes Intravenous Every 6 hours 12/03/10 1541 12/07/10 0726   12/02/10 0600   moxifloxacin (AVELOX) IVPB 400 mg  Status:  Discontinued        400 mg 250 mL/hr over 60 Minutes Intravenous Every 24 hours 12/02/10 0532 12/03/10 1511   12/02/10 0000   moxifloxacin (AVELOX) IVPB 400 mg  Status:  Discontinued        400 mg 250 mL/hr over 60 Minutes Intravenous Daily 12/01/10 2328 12/02/10 0532   12/01/10 2215   moxifloxacin (AVELOX) IVPB 400 mg        400 mg 250 mL/hr over 60 Minutes Intravenous  Once 12/01/10 2209 12/01/10 2324   12/01/10 1915   clindamycin (CLEOCIN) IVPB 600 mg        600 mg 100 mL/hr over 30 Minutes Intravenous  Once 12/01/10 1906 12/01/10 2017         Current Facility-Administered Medications  Medication Dose Route Frequency Provider Last Rate  Last Dose  . acetaminophen (TYLENOL) tablet 650 mg  650 mg Oral Q6H PRN Mary A. Lynch   650 mg at 12/08/10 0313  . amLODipine (NORVASC) tablet 5 mg  5 mg Oral Daily Clent Demark, MD   5 mg at 12/09/10 0915  . aspirin EC tablet 81 mg  81 mg Oral Daily Clent Demark, MD   81 mg at 12/09/10 0916  . enoxaparin (LOVENOX) injection 40 mg  40 mg Subcutaneous Q24H Clent Demark, MD   40 mg at 12/09/10 0916  . ferrous sulfate tablet 325 mg  325 mg Oral BID WC Clent Demark, MD   325 mg at 12/09/10 0917  . HYDROcodone-acetaminophen (NORCO) 5-325 MG per tablet 1-2 tablet  1-2 tablet Oral Q6H PRN Kevan Ny, MD   2 tablet at 12/09/10 1018  . imipenem-cilastatin (PRIMAXIN) 500 mg in sodium chloride 0.9 % 100 mL IVPB  500 mg Intravenous Q8H Marcell Anger, PHARMD   500 mg at 12/09/10 0526  . insulin aspart (novoLOG) injection 0-9 Units  0-9 Units Subcutaneous TID WC Clent Demark, MD   1 Units at  12/08/10 1229  . insulin glargine (LANTUS) injection 35 Units  35 Units Subcutaneous BID Clent Demark, MD   35 Units at 12/09/10 1044  . metoprolol tartrate (LOPRESSOR) tablet 25 mg  25 mg Oral BID Clent Demark, MD   25 mg at 12/09/10 0918  . pantoprazole (PROTONIX) EC tablet 40 mg  40 mg Oral Q0600 Clent Demark, MD   40 mg at 12/09/10 0526  . potassium chloride (K-DUR) CR tablet 40 mEq  40 mEq Oral BID Kevan Ny, MD   40 mEq at 12/07/10 1430  . potassium chloride SA (K-DUR,KLOR-CON) CR tablet 40 mEq  40 mEq Oral TID Clent Demark, MD   40 mEq at 12/08/10 2155  . rosuvastatin (CRESTOR) tablet 10 mg  10 mg Oral q1800 Clent Demark, MD   10 mg at 12/08/10 1842  . sodium chloride 0.9 % IV infusion   Intravenous Continuous Clent Demark, MD 125 mL/hr at 12/09/10 0825    . vancomycin (VANCOCIN) 1,500 mg in sodium chloride 0.9 % 500 mL IVPB  1,500 mg Intravenous Q24H Marcell Anger, PHARMD   1,500 mg at 12/09/10 0919  . DISCONTD: insulin glargine (LANTUS) injection 40 Units  40 Units Subcutaneous BID Clent Demark, MD   40 Units at 12/08/10 2203    Assessment/Plan   1. Perineum abscess. 2.  Patient Active Problem List  Diagnoses  . Cellulitis of pubic region  . Uncontrolled type 2 DM with hyperosmolar nonketotic hyperglycemia  . Sleep apnea  . Morbid obesity  . CHF (congestive heart failure)  . Chronic renal failure  . TIA (transient ischemic attack)  . CAD (coronary artery disease)  Plan: Heat, keep clean and dry. Hope it will drain itself.    LOS: 8 days    JENNINGS,WILLARD 12/09/2010

## 2010-12-09 NOTE — Progress Notes (Signed)
Subjective:  Appreciate surgical consult  Patient states swelling and pain in the pubic region has reduced and she has noticed minimal drainage from the abscess site. Denies any fever or chills  Objective:  Vital Signs in the last 24 hours: Temp:  [99 F (37.2 C)-99.8 F (37.7 C)] 99 F (37.2 C) (11/13 0557) Pulse Rate:  [71-78] 78  (11/13 0557) Resp:  [18-20] 18  (11/13 0557) BP: (131-184)/(74-82) 149/77 mmHg (11/13 0557) SpO2:  [95 %-96 %] 95 % (11/13 0557) Weight:  [122.5 kg (270 lb 1 oz)] 270 lb 1 oz (122.5 kg) (11/13 0557)  Intake/Output from previous day: 11/12 0701 - 11/13 0700 In: 3640 [P.O.:840; I.V.:2000; IV Piggyback:800] Out: 1850 [Urine:1850] Intake/Output from this shift:    Physical Exam: General appearance: alert and cooperative Neck: no adenopathy, no carotid bruit, no JVD, supple, symmetrical, trachea midline and thyroid not enlarged, symmetric, no tenderness/mass/nodules Lungs: clear to auscultation bilaterally Heart: regular rate and rhythm, S1, S2 normal, no murmur, click, rub or gallop Abdomen: soft, non-tender; bowel sounds normal; no masses,  no organomegaly Extremities: extremities normal, atraumatic, no cyanosis or edema Pulses: 2+ and symmetric Right pubic area swelling and erythema reduced. Lab Results:  [facility] 12/09/10 0436 12/08/10 0515  WBC 10.5 13.4*  HGB 8.2* 8.0*  PLT 339 318    Basename 12/09/10 0436 12/08/10 1122 12/08/10 0515  NA 138 -- 140  K 2.8* 3.1* --  CL 107 -- 108  CO2 19 -- 20  GLUCOSE 117* -- 86  BUN 12 -- 13  CREATININE 1.15* -- 1.33*   No results found for this basename: TROPONINI:2,CK,MB:2 in the last 72 hours Hepatic Function Panel  Basename 12/08/10 0515  PROT 6.7  ALBUMIN 1.9*  AST 12  ALT 8  ALKPHOS 182*  BILITOT 0.3  BILIDIR --  IBILI --   No results found for this basename: CHOL in the last 72 hours No results found for this basename: PROTIME in the last 72 hours  Imaging:  Cardiac  Studies:  Assessment/Plan:  Resolving cellulitis/abscess right perineal area  Resolving hypokalemia Coronary artery disease stable Hypertension Diabetes mellitus Scoliosis Chronic anemia Status post renal insufficiency Morbid obesity Obstructive sleep apnea obesity hypoventilation syndrome. Plan continue local care as per surgery and present antibiotics Replace K. Reduce IV fluids per orders  LOS: 8 days    Margrete Delude N 12/09/2010, 9:17 AM

## 2010-12-09 NOTE — Plan of Care (Signed)
Pt originally assessed for nutrition risk by nursing at admission on 11/6.  At that time no needs were identified.  PM RN reassessed last night and did not identify areas of risk, but indicated an RD consult may be needed.  Pt with multiple diagnoses, several nutrition-related, however when RD spoke with pt, she denies needs and declines education at this time stating 'I'm fine.'  RD encouraged pt to ask questions, however pt has none at this time.  Offered materials, pt states none needed.  PO intake variable, overall appears to be adequate, 25-100% of meals. Please consult RD if further needs/questions present.  Pager: [phone]

## 2010-12-10 LAB — BASIC METABOLIC PANEL
BUN: 10 mg/dL (ref 6–23)
CO2: 22 mEq/L (ref 19–32)
Calcium: 8.2 mg/dL — ABNORMAL LOW (ref 8.4–10.5)
Chloride: 106 mEq/L (ref 96–112)
Creatinine, Ser: 1.05 mg/dL (ref 0.50–1.10)
GFR calc Af Amer: 66 mL/min — ABNORMAL LOW (ref >90)
GFR calc non Af Amer: 57 mL/min — ABNORMAL LOW (ref >90)
Glucose, Bld: 102 mg/dL — ABNORMAL HIGH (ref 70–99)
Potassium: 3 mEq/L — ABNORMAL LOW (ref 3.5–5.1)
Sodium: 139 mEq/L (ref 135–145)

## 2010-12-10 LAB — GLUCOSE, CAPILLARY
Glucose-Capillary: 107 mg/dL — ABNORMAL HIGH (ref 70–99)
Glucose-Capillary: 95 mg/dL (ref 70–99)
Glucose-Capillary: 157 mg/dL — ABNORMAL HIGH (ref 70–99)
Glucose-Capillary: 130 mg/dL — ABNORMAL HIGH (ref 70–99)

## 2010-12-10 LAB — CBC
HCT: 25.1 % — ABNORMAL LOW (ref 36.0–46.0)
Hemoglobin: 8.4 g/dL — ABNORMAL LOW (ref 12.0–15.0)
MCH: 30.4 pg (ref 26.0–34.0)
MCHC: 33.5 g/dL (ref 30.0–36.0)
MCV: 90.9 fL (ref 78.0–100.0)
Platelets: 378 10*3/uL (ref 150–400)
RBC: 2.76 MIL/uL — ABNORMAL LOW (ref 3.87–5.11)
RDW: 16.1 % — ABNORMAL HIGH (ref 11.5–15.5)
WBC: 10.7 10*3/uL — ABNORMAL HIGH (ref 4.0–10.5)

## 2010-12-10 LAB — MAGNESIUM: Magnesium: 1.2 mg/dL — ABNORMAL LOW (ref 1.5–2.5)

## 2010-12-10 LAB — VANCOMYCIN, TROUGH: Vancomycin Tr: 16.7 ug/mL (ref 10.0–20.0)

## 2010-12-10 MED ORDER — SODIUM CHLORIDE 0.9 % IV SOLN
1250.0000 mg | INTRAVENOUS | Status: DC
  Administered 2010-12-11 – 2010-12-15 (×5): 1250 mg via INTRAVENOUS
  Filled 2010-12-10 (×5): qty 1250

## 2010-12-10 MED ORDER — POTASSIUM CHLORIDE 10 MEQ/100ML IV SOLN
10.0000 meq | INTRAVENOUS | Status: AC
  Administered 2010-12-10 – 2010-12-11 (×4): 10 meq via INTRAVENOUS
  Filled 2010-12-10 (×4): qty 100

## 2010-12-10 MED ORDER — MAGNESIUM SULFATE 50 % IJ SOLN
4.0000 g | Freq: Once | INTRAVENOUS | Status: AC
  Administered 2010-12-10: 4 g via INTRAVENOUS
  Filled 2010-12-10: qty 8

## 2010-12-10 MED ORDER — ENOXAPARIN SODIUM 60 MG/0.6ML ~~LOC~~ SOLN
60.0000 mg | SUBCUTANEOUS | Status: DC
  Administered 2010-12-10 – 2010-12-15 (×6): 60 mg via SUBCUTANEOUS
  Filled 2010-12-10 (×7): qty 0.6

## 2010-12-10 NOTE — Progress Notes (Signed)
ANTIBIOTIC CONSULT NOTE - FOLLOW UP  Pharmacy Consult for Primaxin/Vancomycin Indication: suprapubic cellulitis  Allergies  Allergen Reactions  . Penicillins Hives    Patient Measurements: Height: 5\' 5"  (165.1 cm) Weight: 266 lb 12.8 oz (121.02 kg) IBW/kg (Calculated) : 57  Adjusted Body Weight: 80 kg BMI = 44  Vital Signs: Temp: 98 F (36.7 C) (11/14 0624) Temp src: Oral (11/14 0624) BP: 163/88 mmHg (11/14 0624) Pulse Rate: 70  (11/14 0624) Intake/Output from previous day: 11/13 0701 - 11/14 0700 In: 2180 [P.O.:360; I.V.:420; IV Piggyback:1400] Out: 1250 [Urine:1250]  Total I/O In: -  Out: 500 [Urine:500]  Labs:  [facility] 12/10/10 0805 12/09/10 0436 12/08/10 0515  WBC 10.7* 10.5 13.4*  HGB 8.4* 8.2* 8.0*  PLT 378 339 318  LABCREA -- -- --  CREATININE 1.05 1.15* 1.33*   Estimated Creatinine Clearance: 75.2 ml/min (by C-G formula based on Cr of 1.05). Normalized CrCl ~70ml/min/1.73m2  Microbiology: Blood x 4: NG (final) Urine: NG (final)  Antibiotics: Day #8 Vancomycin (current dose 1500mg  IV q24h) Day #8 Primaxin (current 500mg  IV q8h)  Assessment: 60 yo F w/suprapubic cellulitis, vancomycin trough = 16.7 mcg/ml is slightly > goal after increased dose on 11/11, Scr, UOP & WBC continue to improve.  Goal of Therapy:  Vancomycin trough level 10-15 mcg/ml  Plan:  1. Decrease vancomycin dose slightly to 1250mg  IV q24h, recheck trough at steady state if needed. 2. Continue Primaxin 500mg  IV q8h 3. Follow up duration of antibiotic therapy 4. Since BMI > 30, will adjust Lovenox to 60mg  (0.5mg /kg) q24h    Peggyann Juba R 9:30 AM 12/10/2010

## 2010-12-10 NOTE — Progress Notes (Signed)
Subjective: Doing well Abscess is draining on it's own.  Objective: Vital signs in last 24 hours: Temp:  [98 F (36.7 C)-99.5 F (37.5 C)] 99 F (37.2 C) (11/14 1430) Pulse Rate:  [69-84] 69  (11/14 1430) Resp:  [16-18] 18  (11/14 1430) BP: (144-177)/(85-89) 144/89 mmHg (11/14 1430) SpO2:  [96 %] 96 % (11/14 1430) Weight:  [121.02 kg (266 lb 12.8 oz)] 266 lb 12.8 oz (121.02 kg) (11/14 0643) Last BM Date: 12/09/10  Intake/Output from previous day: 11/13 0701 - 11/14 0700 In: 2180 [P.O.:360; I.V.:420; IV Piggyback:1400] Out: 1250 [Urine:1250] Intake/Output this shift: Total I/O In: 240 [P.O.:240] Out: 1000 [Urine:1000]  General appearance: alert, cooperative and no distress Thick White drainage from perineium.  Lab Results:   [facility] 12/10/10 0805 12/09/10 0436  WBC 10.7* 10.5  HGB 8.4* 8.2*  HCT 25.1* 25.0*  PLT 378 339    BMET  Basename 12/10/10 0805 12/09/10 0436  NA 139 138  K 3.0* 2.8*  CL 106 107  CO2 22 19  GLUCOSE 102* 117*  BUN 10 12  CREATININE 1.05 1.15*  CALCIUM 8.2* 8.3*   PT/INR No results found for this basename: LABPROT:2,INR:2 in the last 72 hours   Studies/Results: No results found.  Anti-infectives: Anti-infectives     Start     Dose/Rate Route Frequency Ordered Stop   12/11/10 1000   vancomycin (VANCOCIN) 1,250 mg in sodium chloride 0.9 % 250 mL IVPB        1,250 mg 166.7 mL/hr over 90 Minutes Intravenous Every 24 hours 12/10/10 0949     12/07/10 1000  imipenem-cilastatin (PRIMAXIN) 500 mg in sodium chloride 0.9 % 100 mL IVPB       500 mg 200 mL/hr over 30 Minutes Intravenous 3 times per day 12/07/10 0726     12/07/10 0800   vancomycin (VANCOCIN) 1,500 mg in sodium chloride 0.9 % 500 mL IVPB  Status:  Discontinued        1,500 mg 250 mL/hr over 120 Minutes Intravenous Every 24 hours 12/07/10 0714 12/10/10 0947   12/05/10 0600   vancomycin (VANCOCIN) 1,500 mg in sodium chloride 0.9 % 500 mL IVPB  Status:  Discontinued       1,500 mg 250 mL/hr over 120 Minutes Intravenous Every 48 hours 12/04/10 0958 12/06/10 0846   12/03/10 1600   vancomycin (VANCOCIN) IVPB 1000 mg/200 mL premix        1,000 mg 200 mL/hr over 60 Minutes Intravenous  Once 12/03/10 1541 12/03/10 1836   12/03/10 1600   imipenem-cilastatin (PRIMAXIN) 250 mg in sodium chloride 0.9 % 100 mL IVPB  Status:  Discontinued        250 mg 200 mL/hr over 30 Minutes Intravenous Every 6 hours 12/03/10 1541 12/07/10 0726   12/02/10 0600   moxifloxacin (AVELOX) IVPB 400 mg  Status:  Discontinued        400 mg 250 mL/hr over 60 Minutes Intravenous Every 24 hours 12/02/10 0532 12/03/10 1511   12/02/10 0000   moxifloxacin (AVELOX) IVPB 400 mg  Status:  Discontinued        400 mg 250 mL/hr over 60 Minutes Intravenous Daily 12/01/10 2328 12/02/10 0532   12/01/10 2215   moxifloxacin (AVELOX) IVPB 400 mg        400 mg 250 mL/hr over 60 Minutes Intravenous  Once 12/01/10 2209 12/01/10 2324   12/01/10 1915   clindamycin (CLEOCIN) IVPB 600 mg        600 mg 100 mL/hr  over 30 Minutes Intravenous  Once 12/01/10 1906 12/01/10 2017         Current Facility-Administered Medications  Medication Dose Route Frequency Provider Last Rate Last Dose  . acetaminophen (TYLENOL) tablet 650 mg  650 mg Oral Q6H PRN Mary A. Lynch   650 mg at 12/10/10 0124  . amLODipine (NORVASC) tablet 5 mg  5 mg Oral Daily Clent Demark, MD   5 mg at 12/10/10 1038  . aspirin EC tablet 81 mg  81 mg Oral Daily Clent Demark, MD   81 mg at 12/10/10 1038  . enoxaparin (LOVENOX) injection 60 mg  60 mg Subcutaneous Q24H Clent Demark, MD   60 mg at 12/10/10 1139  . ferrous sulfate tablet 325 mg  325 mg Oral BID WC Clent Demark, MD   325 mg at 12/10/10 1649  . HYDROcodone-acetaminophen (NORCO) 5-325 MG per tablet 1-2 tablet  1-2 tablet Oral Q6H PRN Kevan Ny, MD   2 tablet at 12/10/10 1141  . imipenem-cilastatin (PRIMAXIN) 500 mg in sodium chloride 0.9 % 100 mL IVPB  500 mg Intravenous  Q8H Marcell Anger, PHARMD   500 mg at 12/10/10 1334  . insulin aspart (novoLOG) injection 0-9 Units  0-9 Units Subcutaneous TID WC Clent Demark, MD   3 Units at 12/09/10 1726  . insulin glargine (LANTUS) injection 35 Units  35 Units Subcutaneous BID Clent Demark, MD   35 Units at 12/10/10 1040  . metoprolol tartrate (LOPRESSOR) tablet 25 mg  25 mg Oral BID Clent Demark, MD   25 mg at 12/10/10 1040  . pantoprazole (PROTONIX) EC tablet 40 mg  40 mg Oral Q0600 Clent Demark, MD   40 mg at 12/10/10 0616  . potassium chloride 10 mEq in 100 mL IVPB  10 mEq Intravenous Q1 Hr x 4 Clent Demark, MD   10 mEq at 12/10/10 0457  . potassium chloride SA (K-DUR,KLOR-CON) CR tablet 40 mEq  40 mEq Oral TID Clent Demark, MD   40 mEq at 12/08/10 2155  . rosuvastatin (CRESTOR) tablet 10 mg  10 mg Oral q1800 Clent Demark, MD   10 mg at 12/09/10 1727  . sodium chloride 0.9 % IV infusion   Intravenous Continuous Clent Demark, MD 60 mL/hr at 12/10/10 1338    . vancomycin (VANCOCIN) 1,250 mg in sodium chloride 0.9 % 250 mL IVPB  1,250 mg Intravenous Q24H Clent Demark, MD      . DISCONTD: enoxaparin (LOVENOX) injection 40 mg  40 mg Subcutaneous Q24H Clent Demark, MD   40 mg at 12/09/10 0916  . DISCONTD: vancomycin (VANCOCIN) 1,500 mg in sodium chloride 0.9 % 500 mL IVPB  1,500 mg Intravenous Q24H Marcell Anger, PHARMD   1,500 mg at 12/10/10 1647    Assessment/Plan  Abscess R Perineum draining on it's own.  Continue current Rx.   LOS: 9 days    JENNINGS,WILLARD 12/10/2010

## 2010-12-10 NOTE — Progress Notes (Signed)
Pt has refused her PO doses of KCl throughout the day.  States "I've told the nurses and the doctor that I won't take those pills".  Offered to dissolve tablets; pt continues to refuse.  Instructed on need for KCl supplementation with this AM's K+ level of 2.8.  Pt states "I don't care, I'm not taking those pills.  You're gonna have to give it to me IV".   Has remained NSR, rate 70s on telemetry.  Dr Terrence Dupont made aware of pt's refusal; new orders received and implemented.

## 2010-12-10 NOTE — Progress Notes (Signed)
Subjective:  Patient denies any chest pain or shortness of breath patient states the pain in the pubic region has improved and swelling also has improved. And there is some minimal drainage. I denies any fever or chills. The Patient refused to take by mouth K. door Labs from this a.m. are still pending.  Objective:  Vital Signs in the last 24 hours: Temp:  [98 F (36.7 C)-99.5 F (37.5 C)] 98 F (36.7 C) (11/14 0624) Pulse Rate:  [70-84] 70  (11/14 0624) Resp:  [16-19] 16  (11/14 0624) BP: (163-177)/(85-90) 163/88 mmHg (11/14 0624) SpO2:  [96 %-97 %] 96 % (11/14 0624) Weight:  [121.02 kg (266 lb 12.8 oz)] 266 lb 12.8 oz (121.02 kg) (11/14 0643)  Intake/Output from previous day: 11/13 0701 - 11/14 0700 In: 2180 [P.O.:360; I.V.:420; IV Piggyback:1400] Out: 1250 [Urine:1250] Intake/Output from this shift:    Physical Exam: General appearance: alert and cooperative Neck: no adenopathy, no carotid bruit, no JVD, supple, symmetrical, trachea midline and thyroid not enlarged, symmetric, no tenderness/mass/nodules Lungs: clear to auscultation bilaterally Heart: regular rate and rhythm, S1, S2 normal, no murmur, click, rub or gallop Abdomen: soft, non-tender; bowel sounds normal; no masses,  no organomegaly Extremities: extremities normal, atraumatic, no cyanosis or edema Resolving swelling and erythema in her right perineal region Lab Results:  [facility] 12/09/10 0436 12/08/10 0515  WBC 10.5 13.4*  HGB 8.2* 8.0*  PLT 339 318    Basename 12/09/10 0436 12/08/10 1122 12/08/10 0515  NA 138 -- 140  K 2.8* 3.1* --  CL 107 -- 108  CO2 19 -- 20  GLUCOSE 117* -- 86  BUN 12 -- 13  CREATININE 1.15* -- 1.33*   No results found for this basename: TROPONINI:2,CK,MB:2 in the last 72 hours Hepatic Function Panel  Basename 12/08/10 0515  PROT 6.7  ALBUMIN 1.9*  AST 12  ALT 8  ALKPHOS 182*  BILITOT 0.3  BILIDIR --  IBILI --   No results found for this basename: CHOL in the last 72  hours No results found for this basename: PROTIME in the last 72 hours  Imaging:   Cardiac Studies:  Assessment/Plan:  Resolving cellulitis/abscess in right perineal region Coronary artery disease stable Insulin-requiring diabetes mellitus blood sugar better controlled Hypo kalemia Hypertension Chronic anemia Morbid obesity Scoliosis Degenerative joint disease Status post acute renal insufficiency Plan continue present management  Check labs  LOS: 9 days    Ori Kreiter N 12/10/2010, 8:09 AM

## 2010-12-11 LAB — GLUCOSE, CAPILLARY
Glucose-Capillary: 95 mg/dL (ref 70–99)
Glucose-Capillary: 108 mg/dL — ABNORMAL HIGH (ref 70–99)
Glucose-Capillary: 183 mg/dL — ABNORMAL HIGH (ref 70–99)
Glucose-Capillary: 121 mg/dL — ABNORMAL HIGH (ref 70–99)
Glucose-Capillary: 147 mg/dL — ABNORMAL HIGH (ref 70–99)

## 2010-12-11 LAB — BASIC METABOLIC PANEL
BUN: 9 mg/dL (ref 6–23)
CO2: 23 mEq/L (ref 19–32)
Calcium: 8.3 mg/dL — ABNORMAL LOW (ref 8.4–10.5)
Chloride: 107 mEq/L (ref 96–112)
Creatinine, Ser: 1.03 mg/dL (ref 0.50–1.10)
GFR calc Af Amer: 68 mL/min — ABNORMAL LOW (ref >90)
GFR calc non Af Amer: 58 mL/min — ABNORMAL LOW (ref >90)
Glucose, Bld: 120 mg/dL — ABNORMAL HIGH (ref 70–99)
Potassium: 3.3 mEq/L — ABNORMAL LOW (ref 3.5–5.1)
Sodium: 139 mEq/L (ref 135–145)

## 2010-12-11 LAB — CBC
HCT: 25.2 % — ABNORMAL LOW (ref 36.0–46.0)
Hemoglobin: 8.1 g/dL — ABNORMAL LOW (ref 12.0–15.0)
MCH: 29.8 pg (ref 26.0–34.0)
MCHC: 32.1 g/dL (ref 30.0–36.0)
MCV: 92.6 fL (ref 78.0–100.0)
Platelets: 399 10*3/uL (ref 150–400)
RBC: 2.72 MIL/uL — ABNORMAL LOW (ref 3.87–5.11)
RDW: 15.9 % — ABNORMAL HIGH (ref 11.5–15.5)
WBC: 11.6 10*3/uL — ABNORMAL HIGH (ref 4.0–10.5)

## 2010-12-11 LAB — MAGNESIUM: Magnesium: 1.9 mg/dL (ref 1.5–2.5)

## 2010-12-11 MED ORDER — POTASSIUM CHLORIDE 10 MEQ/100ML IV SOLN
10.0000 meq | INTRAVENOUS | Status: AC
  Administered 2010-12-11 (×3): 10 meq via INTRAVENOUS
  Filled 2010-12-11 (×3): qty 100

## 2010-12-11 NOTE — Progress Notes (Signed)
Pt continues to refuse tonight's PO KCl dose and has refused today's doses as well.  This AM's K+ level was 3.0 and Mg level was 1.2. Dr Terrence Dupont made aware of lab values and of pt's refusal; new orders received and implemented.

## 2010-12-11 NOTE — Progress Notes (Signed)
Subjective: I need my O2   Objective: Vital signs in last 24 hours: Temp:  [97.9 F (36.6 C)-99 F (37.2 C)] 97.9 F (36.6 C) (11/15 0501) Pulse Rate:  [69-82] 72  (11/15 0501) Resp:  [18] 18  (11/15 0501) BP: (144-155)/(83-89) 155/89 mmHg (11/15 0501) SpO2:  [94 %-97 %] 97 % (11/15 0501) Weight:  [121.065 kg (266 lb 14.4 oz)] 266 lb 14.4 oz (121.065 kg) (11/15 0501) Last BM Date: 12/09/10  Intake/Output from previous day: 11/14 0701 - 11/15 0700 In: 3070 [P.O.:600; I.V.:1270; IV Piggyback:1200] Out: 2400 [Urine:2400] Intake/Output this shift: Total I/O In: 120 [P.O.:120] Out: 400 [Urine:400]  General appearance: alert, cooperative and no distress Perineium draining less today still areas that are hard and indurated.  Lab Results:   [facility] 12/11/10 0449 12/10/10 0805  WBC 11.6* 10.7*  HGB 8.1* 8.4*  HCT 25.2* 25.1*  PLT 399 378    BMET  Basename 12/11/10 0449 12/10/10 0805  NA 139 139  K 3.3* 3.0*  CL 107 106  CO2 23 22  GLUCOSE 120* 102*  BUN 9 10  CREATININE 1.03 1.05  CALCIUM 8.3* 8.2*   PT/INR No results found for this basename: LABPROT:2,INR:2 in the last 72 hours   Studies/Results: No results found.  Anti-infectives: Anti-infectives     Start     Dose/Rate Route Frequency Ordered Stop   12/11/10 1600   vancomycin (VANCOCIN) 1,250 mg in sodium chloride 0.9 % 250 mL IVPB        1,250 mg 166.7 mL/hr over 90 Minutes Intravenous Every 24 hours 12/10/10 0949     12/07/10 1000   imipenem-cilastatin (PRIMAXIN) 500 mg in sodium chloride 0.9 % 100 mL IVPB        500 mg 200 mL/hr over 30 Minutes Intravenous 3 times per day 12/07/10 0726     12/07/10 0800   vancomycin (VANCOCIN) 1,500 mg in sodium chloride 0.9 % 500 mL IVPB  Status:  Discontinued        1,500 mg 250 mL/hr over 120 Minutes Intravenous Every 24 hours 12/07/10 0714 12/10/10 0947   12/05/10 0600   vancomycin (VANCOCIN) 1,500 mg in sodium chloride 0.9 % 500 mL IVPB  Status:   Discontinued        1,500 mg 250 mL/hr over 120 Minutes Intravenous Every 48 hours 12/04/10 0958 12/06/10 0846   12/03/10 1600   vancomycin (VANCOCIN) IVPB 1000 mg/200 mL premix        1,000 mg 200 mL/hr over 60 Minutes Intravenous  Once 12/03/10 1541 12/03/10 1836   12/03/10 1600   imipenem-cilastatin (PRIMAXIN) 250 mg in sodium chloride 0.9 % 100 mL IVPB  Status:  Discontinued        250 mg 200 mL/hr over 30 Minutes Intravenous Every 6 hours 12/03/10 1541 12/07/10 0726   12/02/10 0600   moxifloxacin (AVELOX) IVPB 400 mg  Status:  Discontinued        400 mg 250 mL/hr over 60 Minutes Intravenous Every 24 hours 12/02/10 0532 12/03/10 1511   12/02/10 0000   moxifloxacin (AVELOX) IVPB 400 mg  Status:  Discontinued        400 mg 250 mL/hr over 60 Minutes Intravenous Daily 12/01/10 2328 12/02/10 0532   12/01/10 2215   moxifloxacin (AVELOX) IVPB 400 mg        400 mg 250 mL/hr over 60 Minutes Intravenous  Once 12/01/10 2209 12/01/10 2324   12/01/10 1915   clindamycin (CLEOCIN) IVPB 600 mg  600 mg 100 mL/hr over 30 Minutes Intravenous  Once 12/01/10 1906 12/01/10 2017         Current Facility-Administered Medications  Medication Dose Route Frequency Provider Last Rate Last Dose  . acetaminophen (TYLENOL) tablet 650 mg  650 mg Oral Q6H PRN Mary A. Lynch   650 mg at 12/10/10 0124  . amLODipine (NORVASC) tablet 5 mg  5 mg Oral Daily Clent Demark, MD   5 mg at 12/11/10 1123  . aspirin EC tablet 81 mg  81 mg Oral Daily Clent Demark, MD   81 mg at 12/11/10 1123  . enoxaparin (LOVENOX) injection 60 mg  60 mg Subcutaneous Q24H Clent Demark, MD   60 mg at 12/11/10 1123  . ferrous sulfate tablet 325 mg  325 mg Oral BID WC Clent Demark, MD   325 mg at 12/11/10 0906  . HYDROcodone-acetaminophen (NORCO) 5-325 MG per tablet 1-2 tablet  1-2 tablet Oral Q6H PRN Kevan Ny, MD   2 tablet at 12/11/10 0117  . imipenem-cilastatin (PRIMAXIN) 500 mg in sodium chloride 0.9 % 100 mL IVPB   500 mg Intravenous Q8H Marcell Anger, PHARMD   500 mg at 12/11/10 0616  . insulin aspart (novoLOG) injection 0-9 Units  0-9 Units Subcutaneous TID WC Clent Demark, MD   2 Units at 12/10/10 1707  . insulin glargine (LANTUS) injection 35 Units  35 Units Subcutaneous BID Clent Demark, MD   35 Units at 12/11/10 1124  . magnesium sulfate 4 g in dextrose 5 % 250 mL infusion  4 g Intravenous Once Clent Demark, MD 62.5 mL/hr at 12/10/10 2106 4 g at 12/10/10 2106  . metoprolol tartrate (LOPRESSOR) tablet 25 mg  25 mg Oral BID Clent Demark, MD   25 mg at 12/11/10 1123  . pantoprazole (PROTONIX) EC tablet 40 mg  40 mg Oral Q0600 Clent Demark, MD   40 mg at 12/11/10 0616  . potassium chloride 10 mEq in 100 mL IVPB  10 mEq Intravenous Q1 Hr x 4 Clent Demark, MD   10 mEq at 12/11/10 0221  . potassium chloride 10 mEq in 100 mL IVPB  10 mEq Intravenous Q1 Hr x 3 Clent Demark, MD      . potassium chloride SA (K-DUR,KLOR-CON) CR tablet 40 mEq  40 mEq Oral TID Clent Demark, MD   40 mEq at 12/08/10 2155  . rosuvastatin (CRESTOR) tablet 10 mg  10 mg Oral q1800 Clent Demark, MD   10 mg at 12/10/10 1806  . sodium chloride 0.9 % IV infusion   Intravenous Continuous Clent Demark, MD 60 mL/hr at 12/10/10 1338    . vancomycin (VANCOCIN) 1,250 mg in sodium chloride 0.9 % 250 mL IVPB  1,250 mg Intravenous Q24H Clent Demark, MD        Assessment/Plan   Abscess R Perineum Patient Active Problem List  Diagnoses  . Cellulitis of pubic region  . Uncontrolled type 2 DM with hyperosmolar nonketotic hyperglycemia  . Sleep apnea  . Morbid obesity  . CHF (congestive heart failure)  . Chronic renal failure  . TIA (transient ischemic attack)  . CAD (coronary artery disease)  Plan:  No change.   LOS: 10 days    Michelle Holmes 12/11/2010

## 2010-12-11 NOTE — Progress Notes (Signed)
Subjective:  Patient's great pain and swelling in the perineal region has improved Denies any fever or chills  Objective:  Vital Signs in the last 24 hours: Temp:  [97.9 F (36.6 C)-99 F (37.2 C)] 97.9 F (36.6 C) (11/15 0501) Pulse Rate:  [69-82] 72  (11/15 0501) Resp:  [18] 18  (11/15 0501) BP: (144-155)/(83-89) 155/89 mmHg (11/15 0501) SpO2:  [94 %-97 %] 97 % (11/15 0501) Weight:  [121.065 kg (266 lb 14.4 oz)] 266 lb 14.4 oz (121.065 kg) (11/15 0501)  Intake/Output from previous day: 11/14 0701 - 11/15 0700 In: 3070 [P.O.:600; I.V.:1270; IV Piggyback:1200] Out: 2400 [Urine:2400] Intake/Output from this shift: Total I/O In: 120 [P.O.:120] Out: 900 [Urine:900]  Physical Exam: General appearance: alert and cooperative Neck: no adenopathy, no carotid bruit, no JVD, supple, symmetrical, trachea midline and thyroid not enlarged, symmetric, no tenderness/mass/nodules Lungs: clear to auscultation bilaterally Heart: regular rate and rhythm, S1, S2 normal, no murmur, click, rub or gallop Abdomen: soft, non-tender; bowel sounds normal; no masses,  no organomegaly Extremities: extremities normal, atraumatic, no cyanosis or edema A 4 x 6 cm swelling in the right pineal region with erythema noted with no fluctuation. Lab Results:  [facility] 12/11/10 0449 12/10/10 0805  WBC 11.6* 10.7*  HGB 8.1* 8.4*  PLT 399 378    Basename 12/11/10 0449 12/10/10 0805  NA 139 139  K 3.3* 3.0*  CL 107 106  CO2 23 22  GLUCOSE 120* 102*  BUN 9 10  CREATININE 1.03 1.05   No results found for this basename: TROPONINI:2,CK,MB:2 in the last 72 hours Hepatic Function Panel No results found for this basename: PROT,ALBUMIN,AST,ALT,ALKPHOS,BILITOT,BILIDIR,IBILI in the last 72 hours No results found for this basename: CHOL in the last 72 hours No results found for this basename: PROTIME in the last 72 hours  Imaging:   Cardiac Studies:  Assessment/Plan:  Right perineal region  cellulitis/abscess Coronary artery disease stable Diabetes mellitus Morbid obesity Scoliosis Status post acute renal insufficiency Chronic anemia Plan Continue present management Patient encouraged to take a sitz bath and warm water shower Out of bed as tolerated ReplaceK.  LOS: 10 days    Khamia Stambaugh N 12/11/2010, 12:46 PM

## 2010-12-12 ENCOUNTER — Other Ambulatory Visit: Payer: Self-pay

## 2010-12-12 LAB — DIFFERENTIAL
Band Neutrophils: 0 % (ref 0–10)
Basophils Absolute: 0 10*3/uL (ref 0.0–0.1)
Basophils Relative: 0 % (ref 0–1)
Blasts: 0 %
Eosinophils Absolute: 0.2 10*3/uL (ref 0.0–0.7)
Eosinophils Relative: 2 % (ref 0–5)
Lymphocytes Relative: 25 % (ref 12–46)
Lymphs Abs: 2.5 10*3/uL (ref 0.7–4.0)
Metamyelocytes Relative: 0 %
Monocytes Absolute: 0.6 10*3/uL (ref 0.1–1.0)
Monocytes Relative: 5 % (ref 3–12)
Myelocytes: 0 %
Neutro Abs: 7.1 10*3/uL (ref 1.7–7.7)
Neutrophils Relative %: 68 % (ref 43–77)
Promyelocytes Absolute: 0 %
nRBC: 0 /100 WBC

## 2010-12-12 LAB — CULTURE, BLOOD (ROUTINE X 2)
Culture  Setup Time: 201211100152
Culture  Setup Time: 201211100152
Culture: NO GROWTH
Culture: NO GROWTH

## 2010-12-12 LAB — GLUCOSE, CAPILLARY
Glucose-Capillary: 106 mg/dL — ABNORMAL HIGH (ref 70–99)
Glucose-Capillary: 123 mg/dL — ABNORMAL HIGH (ref 70–99)
Glucose-Capillary: 122 mg/dL — ABNORMAL HIGH (ref 70–99)
Glucose-Capillary: 128 mg/dL — ABNORMAL HIGH (ref 70–99)

## 2010-12-12 LAB — BASIC METABOLIC PANEL
BUN: 9 mg/dL (ref 6–23)
CO2: 24 mEq/L (ref 19–32)
Calcium: 8.5 mg/dL (ref 8.4–10.5)
Chloride: 105 mEq/L (ref 96–112)
Creatinine, Ser: 1.1 mg/dL (ref 0.50–1.10)
GFR calc Af Amer: 62 mL/min — ABNORMAL LOW (ref >90)
GFR calc non Af Amer: 54 mL/min — ABNORMAL LOW (ref >90)
Glucose, Bld: 128 mg/dL — ABNORMAL HIGH (ref 70–99)
Potassium: 3.6 mEq/L (ref 3.5–5.1)
Sodium: 139 mEq/L (ref 135–145)

## 2010-12-12 NOTE — Progress Notes (Signed)
* No surgery found *  Subjective: Feeling better.  Less pain.  Doing well   Objective: Vital signs in last 24 hours: Temp:  [97.8 F (36.6 C)-98 F (36.7 C)] 98 F (36.7 C) (11/16 1400) Pulse Rate:  [72-85] 72  (11/16 1400) Resp:  [18-20] 18  (11/16 1400) BP: (156-168)/(81-83) 156/81 mmHg (11/16 1400) SpO2:  [94 %-97 %] 94 % (11/16 1400) Weight:  [264 lb 6.4 oz (119.931 kg)] 264 lb 6.4 oz (119.931 kg) (11/16 0517)   Intake/Output from previous day: 11/15 0701 - 11/16 0700 In: 2000 [P.O.:600; I.V.:1200; IV Piggyback:200] Out: [id number] [Urine:3375] Intake/Output this shift: Total I/O In: 480 [P.O.:480] Out: 400 [Urine:400]  groin not examined  Lab Results:   [facility] 12/11/10 0449 12/10/10 0805  WBC 11.6* 10.7*  HGB 8.1* 8.4*  HCT 25.2* 25.1*  PLT 399 378   BMET  Basename 12/11/10 0449 12/10/10 0805  NA 139 139  K 3.3* 3.0*  CL 107 106  CO2 23 22  GLUCOSE 120* 102*  BUN 9 10  CREATININE 1.03 1.05  CALCIUM 8.3* 8.2*   PT/INR No results found for this basename: LABPROT:2,INR:2 in the last 72 hours  Studies/Results: No results found.  Anti-infectives: Anti-infectives     Start     Dose/Rate Route Frequency Ordered Stop   12/11/10 1600   vancomycin (VANCOCIN) 1,250 mg in sodium chloride 0.9 % 250 mL IVPB        1,250 mg 166.7 mL/hr over 90 Minutes Intravenous Every 24 hours 12/10/10 0949     12/07/10 1000   imipenem-cilastatin (PRIMAXIN) 500 mg in sodium chloride 0.9 % 100 mL IVPB        500 mg 200 mL/hr over 30 Minutes Intravenous 3 times per day 12/07/10 0726     12/07/10 0800   vancomycin (VANCOCIN) 1,500 mg in sodium chloride 0.9 % 500 mL IVPB  Status:  Discontinued        1,500 mg 250 mL/hr over 120 Minutes Intravenous Every 24 hours 12/07/10 0714 12/10/10 0947   12/05/10 0600   vancomycin (VANCOCIN) 1,500 mg in sodium chloride 0.9 % 500 mL IVPB  Status:  Discontinued        1,500 mg 250 mL/hr over 120 Minutes Intravenous Every 48 hours  12/04/10 0958 12/06/10 0846   12/03/10 1600   vancomycin (VANCOCIN) IVPB 1000 mg/200 mL premix        1,000 mg 200 mL/hr over 60 Minutes Intravenous  Once 12/03/10 1541 12/03/10 1836   12/03/10 1600   imipenem-cilastatin (PRIMAXIN) 250 mg in sodium chloride 0.9 % 100 mL IVPB  Status:  Discontinued        250 mg 200 mL/hr over 30 Minutes Intravenous Every 6 hours 12/03/10 1541 12/07/10 0726   12/02/10 0600   moxifloxacin (AVELOX) IVPB 400 mg  Status:  Discontinued        400 mg 250 mL/hr over 60 Minutes Intravenous Every 24 hours 12/02/10 0532 12/03/10 1511   12/02/10 0000   moxifloxacin (AVELOX) IVPB 400 mg  Status:  Discontinued        400 mg 250 mL/hr over 60 Minutes Intravenous Daily 12/01/10 2328 12/02/10 0532   12/01/10 2215   moxifloxacin (AVELOX) IVPB 400 mg        400 mg 250 mL/hr over 60 Minutes Intravenous  Once 12/01/10 2209 12/01/10 2324   12/01/10 1915   clindamycin (CLEOCIN) IVPB 600 mg        600 mg 100  mL/hr over 30 Minutes Intravenous  Once 12/01/10 1906 12/01/10 2017          Assessment/Plan: continue to allow passive drainage and continue antibiotics * No surgery found *    LOS: 11 days    Matt B. Hassell Done, MD, [facility] [phone] beeper [phone]  12/12/2010 3:39 PM

## 2010-12-12 NOTE — Progress Notes (Signed)
Subjective:  Patient denies any anginal chest pain nausea or vomiting Denies fever or chills States pains pain and swelling in the perineal region improved  Objective:  Vital Signs in the last 24 hours: Temp:  [97.8 F (36.6 C)-98 F (36.7 C)] 98 F (36.7 C) (11/16 1400) Pulse Rate:  [72-85] 72  (11/16 1400) Resp:  [18-20] 18  (11/16 1400) BP: (156-168)/(81-83) 156/81 mmHg (11/16 1400) SpO2:  [94 %-97 %] 94 % (11/16 1400) Weight:  [119.931 kg (264 lb 6.4 oz)] 264 lb 6.4 oz (119.931 kg) (11/16 0517)  Intake/Output from previous day: 11/15 0701 - 11/16 0700 In: 2000 [P.O.:600; I.V.:1200; IV Piggyback:200] Out: [id number] [Urine:3375] Intake/Output from this shift:    Physical Exam: Neck: no adenopathy, no carotid bruit, no JVD, supple, symmetrical, trachea midline and thyroid not enlarged, symmetric, no tenderness/mass/nodules Lungs: clear to auscultation bilaterally Heart: regular rate and rhythm, S1, S2 normal, no murmur, click, rub or gallop Abdomen: soft, non-tender; bowel sounds normal; no masses,  no organomegaly Extremities: extremities normal, atraumatic, no cyanosis or edema Resolving swelling and erythema  right peroneal region. Lab Results:  [facility] 12/11/10 0449 12/10/10 0805  WBC 11.6* 10.7*  HGB 8.1* 8.4*  PLT 399 378    Basename 12/11/10 0449 12/10/10 0805  NA 139 139  K 3.3* 3.0*  CL 107 106  CO2 23 22  GLUCOSE 120* 102*  BUN 9 10  CREATININE 1.03 1.05   No results found for this basename: TROPONINI:2,CK,MB:2 in the last 72 hours Hepatic Function Panel No results found for this basename: PROT,ALBUMIN,AST,ALT,ALKPHOS,BILITOT,BILIDIR,IBILI in the last 72 hours No results found for this basename: CHOL in the last 72 hours No results found for this basename: PROTIME in the last 72 hours  Imaging:   Cardiac Studies:  Assessment/Plan:  Resolving cellulitis/abscess right peroneal region Coronary artery disease stable  Hypertension Morbid  obesity Insulin-requiring diabetes mellitus Scoliosis Chronic anemia Status post hypokalemia Plan Continue present antibiotics Patient encouraged to take a sitz bath and bath with warm water Check labs in a.m. Dr. Doylene Canard on-call for weekend  LOS: 11 days    Clent Demark 12/12/2010, 7:33 PM

## 2010-12-13 LAB — GLUCOSE, CAPILLARY
Glucose-Capillary: 86 mg/dL (ref 70–99)
Glucose-Capillary: 106 mg/dL — ABNORMAL HIGH (ref 70–99)
Glucose-Capillary: 119 mg/dL — ABNORMAL HIGH (ref 70–99)
Glucose-Capillary: 100 mg/dL — ABNORMAL HIGH (ref 70–99)
Glucose-Capillary: 123 mg/dL — ABNORMAL HIGH (ref 70–99)

## 2010-12-13 LAB — CBC
HCT: 24.4 % — ABNORMAL LOW (ref 36.0–46.0)
Hemoglobin: 7.8 g/dL — ABNORMAL LOW (ref 12.0–15.0)
MCH: 29.5 pg (ref 26.0–34.0)
MCHC: 32 g/dL (ref 30.0–36.0)
MCV: 92.4 fL (ref 78.0–100.0)
Platelets: 457 10*3/uL — ABNORMAL HIGH (ref 150–400)
RBC: 2.64 MIL/uL — ABNORMAL LOW (ref 3.87–5.11)
RDW: 15.7 % — ABNORMAL HIGH (ref 11.5–15.5)
WBC: 11 10*3/uL — ABNORMAL HIGH (ref 4.0–10.5)

## 2010-12-13 NOTE — Progress Notes (Signed)
Subjective: Continues to feel better, no pain.  Area still draining  Objective: Vital signs in last 24 hours: Temp:  [98 F (36.7 C)-99.1 F (37.3 C)] 99.1 F (37.3 C) (11/17 0506) Pulse Rate:  [72-80] 77  (11/17 0506) Resp:  [18-20] 18  (11/17 0506) BP: (156-174)/(73-95) 174/95 mmHg (11/17 0506) SpO2:  [94 %-95 %] 94 % (11/17 0506) Weight:  [260 lb 2.3 oz (118 kg)] 260 lb 2.3 oz (118 kg) (11/17 0546) Last BM Date: 12/13/10  Intake/Output from previous day: 11/16 0701 - 11/17 0700 In: 3082 [P.O.:840; I.V.:1442; IV Piggyback:800] Out: 2550 [Urine:2550] Intake/Output this shift:    Incision/Wound:Purulent drainage expressed from small wound over mons pubis.  4-5 cm induration, no erythema  Lab Results:   [facility] 12/13/10 0453 12/11/10 0449  WBC 11.0* 11.6*  HGB 7.8* 8.1*  HCT 24.4* 25.2*  PLT 457* 399   BMET  Basename 12/12/10 1959 12/11/10 0449  NA 139 139  K 3.6 3.3*  CL 105 107  CO2 24 23  GLUCOSE 128* 120*  BUN 9 9  CREATININE 1.10 1.03  CALCIUM 8.5 8.3*   PT/INR No results found for this basename: LABPROT:2,INR:2 in the last 72 hours ABG No results found for this basename: PHART:2,PCO2:2,PO2:2,HCO3:2 in the last 72 hours  Studies/Results: No results found.  Anti-infectives: Anti-infectives     Start     Dose/Rate Route Frequency Ordered Stop   12/11/10 1600   vancomycin (VANCOCIN) 1,250 mg in sodium chloride 0.9 % 250 mL IVPB        1,250 mg 166.7 mL/hr over 90 Minutes Intravenous Every 24 hours 12/10/10 0949     12/07/10 1000  imipenem-cilastatin (PRIMAXIN) 500 mg in sodium chloride 0.9 % 100 mL IVPB       500 mg 200 mL/hr over 30 Minutes Intravenous 3 times per day 12/07/10 0726     12/07/10 0800   vancomycin (VANCOCIN) 1,500 mg in sodium chloride 0.9 % 500 mL IVPB  Status:  Discontinued        1,500 mg 250 mL/hr over 120 Minutes Intravenous Every 24 hours 12/07/10 0714 12/10/10 0947   12/05/10 0600   vancomycin (VANCOCIN) 1,500 mg in  sodium chloride 0.9 % 500 mL IVPB  Status:  Discontinued        1,500 mg 250 mL/hr over 120 Minutes Intravenous Every 48 hours 12/04/10 0958 12/06/10 0846   12/03/10 1600   vancomycin (VANCOCIN) IVPB 1000 mg/200 mL premix        1,000 mg 200 mL/hr over 60 Minutes Intravenous  Once 12/03/10 1541 12/03/10 1836   12/03/10 1600   imipenem-cilastatin (PRIMAXIN) 250 mg in sodium chloride 0.9 % 100 mL IVPB  Status:  Discontinued        250 mg 200 mL/hr over 30 Minutes Intravenous Every 6 hours 12/03/10 1541 12/07/10 0726   12/02/10 0600   moxifloxacin (AVELOX) IVPB 400 mg  Status:  Discontinued        400 mg 250 mL/hr over 60 Minutes Intravenous Every 24 hours 12/02/10 0532 12/03/10 1511   12/02/10 0000   moxifloxacin (AVELOX) IVPB 400 mg  Status:  Discontinued        400 mg 250 mL/hr over 60 Minutes Intravenous Daily 12/01/10 2328 12/02/10 0532   12/01/10 2215   moxifloxacin (AVELOX) IVPB 400 mg        400 mg 250 mL/hr over 60 Minutes Intravenous  Once 12/01/10 2209 12/01/10 2324   12/01/10 1915  clindamycin (CLEOCIN) IVPB 600 mg        600 mg 100 mL/hr over 30 Minutes Intravenous  Once 12/01/10 1906 12/01/10 2017          Assessment/Plan: Abscess mons pubis, seems to be improving with current Rx.  Continue to follow     LOS: 12 days    Cadi Rhinehart T 12/13/2010

## 2010-12-13 NOTE — Progress Notes (Signed)
Subjective:  Feeling better. Wants to go home. On IV antibiotics.  Objective:  Vital Signs in the last 24 hours: Temp:  [98 F (36.7 C)-99.1 F (37.3 C)] 99.1 F (37.3 C) (11/17 0506) Pulse Rate:  [72-80] 77  (11/17 0506) Resp:  [18-20] 18  (11/17 0506) BP: (156-174)/(73-95) 174/95 mmHg (11/17 0506) SpO2:  [94 %-95 %] 94 % (11/17 0506) Weight:  [118 kg (260 lb 2.3 oz)] 260 lb 2.3 oz (118 kg) (11/17 0546)  Intake/Output from previous day: 11/16 0701 - 11/17 0700 In: 3082 [P.O.:840; I.V.:1442; IV Piggyback:800] Out: 2550 [Urine:2550] Intake/Output from this shift: Total I/O In: -  Out: 350 [Urine:350]  Physical Exam: HEENT: [place]/AT. Conj-pink, Sclera-white. Neck: no adenopathy, no carotid bruit, no JVD, supple, symmetrical, trachea midline and thyroid not enlarged, symmetric, no tenderness/mass/nodules Lungs: clear to auscultation bilaterally Heart: regular rate and rhythm, S1, S2 normal, no murmur, click, rub or gallop Abdomen: soft, non-tender; bowel sounds normal; no masses,  no organomegaly Extremities: extremities normal, atraumatic, no cyanosis or edema Resolving swelling and erythema of Mos pubis.   Lab Results:  [facility] 12/13/10 0453 12/11/10 0449  WBC 11.0* 11.6*  HGB 7.8* 8.1*  PLT 457* 399    Basename 12/12/10 1959 12/11/10 0449  NA 139 139  K 3.6 3.3*  CL 105 107  CO2 24 23  GLUCOSE 128* 120*  BUN 9 9  CREATININE 1.10 1.03    Imaging: Imaging results have been reviewed   Assessment:  Resolving cellulitis/abscess Mos Pubis. Coronary artery disease.  Hypertension Morbid obesity Insulin-requiring diabetes mellitus Scoliosis Chronic anemia Status post hypokalemia  Plan: Continue present antibiotics    LOS: 12 days    Peri Kreft S 12/13/2010, 1:26 PM

## 2010-12-13 NOTE — Progress Notes (Signed)
ANTIBIOTIC CONSULT NOTE - FOLLOW UP  Pharmacy Consult for Primaxin/Vancomycin Indication: suprapubic cellulitis  Allergies  Allergen Reactions  . Penicillins Hives    Patient Measurements: Height: 5\' 5"  (165.1 cm) Weight: 260 lb 2.3 oz (118 kg) IBW/kg (Calculated) : 57  Adjusted Body Weight: 80 kg BMI = 44  Vital Signs: Temp: 99.1 F (37.3 C) (11/17 0506) Temp src: Oral (11/17 0506) BP: 174/95 mmHg (11/17 0506) Pulse Rate: 77  (11/17 0506) Intake/Output from previous day: 11/16 0701 - 11/17 0700 In: 2722 [P.O.:480; I.V.:1442; IV Piggyback:800] Out: 2200 [Urine:2200]     Labs:  [facility] 12/13/10 0453 12/12/10 1959 12/11/10 0449 12/10/10 0805  WBC 11.0* -- 11.6* 10.7*  HGB 7.8* -- 8.1* 8.4*  PLT 457* -- 399 378  LABCREA -- -- -- --  CREATININE -- 1.10 1.03 1.05   Estimated Creatinine Clearance: 70.8 ml/min (by C-G formula based on Cr of 1.1). Normalized CrCl ~68ml/min/1.73m2  Microbiology: Blood x 4: NG (final) Urine: NG (final)  Antibiotics: Day #11 Vancomycin (current dose 1250mg  IV q24h) Day #11 Primaxin (current 500mg  IV q8h)  Assessment: 60 yo F w/suprapubic cellulitis, reportedly improving. Slight fevers. Cultures negative.   Goal of Therapy:  Vancomycin trough level 10-15 mcg/ml  Plan:  1. Cont Vancomycin 1250mg  IV q24h. 2. Cont Primaxin 500mg  IV q8h 3. Follow up duration of antibiotic therapy. 4. Plan Vancomycin trough on 11/18 or 19.   Lolita Patella 7:28 AM 12/13/2010

## 2010-12-14 LAB — GLUCOSE, CAPILLARY
Glucose-Capillary: 98 mg/dL (ref 70–99)
Glucose-Capillary: 130 mg/dL — ABNORMAL HIGH (ref 70–99)
Glucose-Capillary: 101 mg/dL — ABNORMAL HIGH (ref 70–99)
Glucose-Capillary: 152 mg/dL — ABNORMAL HIGH (ref 70–99)

## 2010-12-14 NOTE — Progress Notes (Signed)
   Subjective:  Feeling better. Wants to go home. On IV antibiotics.  Objective:  Vital Signs in the last 24 hours: Temp:  [98.4 F (36.9 C)-98.6 F (37 C)] 98.4 F (36.9 C) (11/18 1310) Pulse Rate:  [72-76] 74  (11/18 1310) Resp:  [18-20] 18  (11/18 1310) BP: (169-177)/(72-89) 169/89 mmHg (11/18 1310) SpO2:  [95 %-98 %] 95 % (11/18 1310)  Intake/Output from previous day: 11/17 0701 - 11/18 0700 In: 2744 [P.O.:840; I.V.:1354; IV Piggyback:550] Out: 2380 [Urine:2380] Intake/Output from this shift: Total I/O In: 240 [P.O.:240] Out: 250 [Urine:250]  Physical Exam: HEENT: [place]/AT. Conj-pink, Sclera-white. Neck: no adenopathy, no carotid bruit, no JVD, supple, symmetrical, trachea midline and thyroid not enlarged, symmetric, no tenderness/mass/nodules Lungs: clear to auscultation bilaterally Heart: regular rate and rhythm, S1, S2 normal, no murmur, click, rub or gallop Abdomen: soft, non-tender; bowel sounds normal; no masses,  no organomegaly Extremities: extremities normal, atraumatic, no cyanosis or edema Resolving swelling and erythema of Mos pubis.   Lab Results:  [name] 12/13/10 0453  WBC 11.0*  HGB 7.8*  PLT 457*    Basename 12/12/10 1959  NA 139  K 3.6  CL 105  CO2 24  GLUCOSE 128*  BUN 9  CREATININE 1.10    Imaging: Imaging results have been reviewed   Assessment:  Resolving cellulitis/abscess Mos Pubis. Coronary artery disease.  Hypertension Morbid obesity Insulin-requiring diabetes mellitus Scoliosis Chronic anemia Status post hypokalemia  Plan: Continue present antibiotics    LOS: 13 days    Michelle Holmes S 12/14/2010, 2:32 PM

## 2010-12-15 LAB — BASIC METABOLIC PANEL
BUN: 9 mg/dL (ref 6–23)
CO2: 26 mEq/L (ref 19–32)
Calcium: 8.8 mg/dL (ref 8.4–10.5)
Chloride: 101 mEq/L (ref 96–112)
Creatinine, Ser: 0.96 mg/dL (ref 0.50–1.10)
GFR calc Af Amer: 74 mL/min — ABNORMAL LOW (ref >90)
GFR calc non Af Amer: 63 mL/min — ABNORMAL LOW (ref >90)
Glucose, Bld: 116 mg/dL — ABNORMAL HIGH (ref 70–99)
Potassium: 3.6 mEq/L (ref 3.5–5.1)
Sodium: 139 mEq/L (ref 135–145)

## 2010-12-15 LAB — CBC
HCT: 24.1 % — ABNORMAL LOW (ref 36.0–46.0)
Hemoglobin: 7.9 g/dL — ABNORMAL LOW (ref 12.0–15.0)
MCH: 29.8 pg (ref 26.0–34.0)
MCHC: 32.8 g/dL (ref 30.0–36.0)
MCV: 90.9 fL (ref 78.0–100.0)
Platelets: 428 10*3/uL — ABNORMAL HIGH (ref 150–400)
RBC: 2.65 MIL/uL — ABNORMAL LOW (ref 3.87–5.11)
RDW: 15.3 % (ref 11.5–15.5)
WBC: 10.2 10*3/uL (ref 4.0–10.5)

## 2010-12-15 LAB — GLUCOSE, CAPILLARY
Glucose-Capillary: 121 mg/dL — ABNORMAL HIGH (ref 70–99)
Glucose-Capillary: 126 mg/dL — ABNORMAL HIGH (ref 70–99)
Glucose-Capillary: 114 mg/dL — ABNORMAL HIGH (ref 70–99)

## 2010-12-15 MED ORDER — FERROUS SULFATE 325 (65 FE) MG PO TABS: 325.0000 mg | ORAL_TABLET | Freq: Two times a day (BID) | ORAL | Status: DC

## 2010-12-15 MED ORDER — AMLODIPINE BESYLATE 5 MG PO TABS: 5.0000 mg | ORAL_TABLET | Freq: Every day | ORAL | Status: DC

## 2010-12-15 MED ORDER — DOXYCYCLINE HYCLATE 100 MG PO TABS: 100.0000 mg | ORAL_TABLET | Freq: Every day | ORAL | Status: AC

## 2010-12-15 MED ORDER — SODIUM CHLORIDE 0.9 % IV SOLN
500.0000 mg | Freq: Four times a day (QID) | INTRAVENOUS | Status: DC
  Administered 2010-12-15: 500 mg via INTRAVENOUS
  Filled 2010-12-15 (×4): qty 500

## 2010-12-15 MED ORDER — PANTOPRAZOLE SODIUM 40 MG PO TBEC: 40.0000 mg | DELAYED_RELEASE_TABLET | Freq: Every day | ORAL | Status: DC

## 2010-12-15 MED ORDER — INSULIN GLARGINE 100 UNIT/ML ~~LOC~~ SOLN: 35.0000 [IU] | Freq: Two times a day (BID) | SUBCUTANEOUS | Status: DC

## 2010-12-15 MED ORDER — METOPROLOL TARTRATE 25 MG PO TABS: 25.0000 mg | ORAL_TABLET | Freq: Two times a day (BID) | ORAL | Status: DC

## 2010-12-15 NOTE — Progress Notes (Signed)
ANTIBIOTIC CONSULT NOTE - FOLLOW UP  Pharmacy Consult for Primaxin and Vancomycin Indication: Suprapubic cellulitis  Allergies  Allergen Reactions  . Penicillins Hives    Patient Measurements: Height: 5\' 5"  (165.1 cm)  Weight: 260 lb 2.3 oz (118 kg)  IBW/kg (Calculated) : 57  Adjusted Body Weight: 80 kg  BMI = 44  Vital Signs: Temp: 98.4 F (36.9 C) (11/19 0600) Temp src: Oral (11/19 0600) BP: 159/80 mmHg (11/19 0600) Pulse Rate: 74  (11/19 0600) Intake/Output from previous day: 11/18 0701 - 11/19 0700 In: 1060 [P.O.:960; IV Piggyback:100] Out: 1351 [Urine:1350; Stool:1] Labs:  [facility] 12/15/10 0735 12/13/10 0453 12/12/10 1959  WBC 10.2 11.0* --  HGB 7.9* 7.8* --  PLT 428* 457* --  LABCREA -- -- --  CREATININE 0.96 -- 1.10   Estimated Creatinine Clearance: 81.1 ml/min (by C-G formula based on Cr of 0.96).  Normalized CrCl 34ml/min/1.73m2  Microbiology: Blood x 4: NG (final)  Urine: NG (final)   Antibiotics: Day #13 Vancomycin (current dose 1250mg  IV q24h) Day #13 Primaxin (dose increased to 500mg  q6h)  Assessment: 60 yo F w/suprapubic cellulitis Afebrile last 24 hours Cultures negative Improving renal function   Goal of Therapy:  Vancomycin trough levels 10-15 mcg/ml  Plan:   1.  Continue Vancomycin 1250mg  IV q24h. 2.  Trough Vancomycin level prior to 4 pm dose today. 3.  Increase Primaxin dose to 500mg  IV q6h  Allena Napoleon R.Ph. 12/15/2010,10:07 AM

## 2010-12-15 NOTE — Discharge Summary (Signed)
  Discharge summary dictated on 12/15/1998 while her dictation number is [id number] thank you

## 2010-12-15 NOTE — Progress Notes (Signed)
Subjective: Patient continues to feel better. Drainage has subsided. She wants to go home.  Objective: Vital signs in last 24 hours: Temp:  [98.3 F (36.8 C)-98.4 F (36.9 C)] 98.4 F (36.9 C) (11/19 0600) Pulse Rate:  [74-85] 74  (11/19 0600) Resp:  [18-20] 20  (11/19 0600) BP: (159-176)/(80-89) 159/80 mmHg (11/19 0600) SpO2:  [95 %-96 %] 95 % (11/19 0600) Last BM Date: 12/14/10  Intake/Output from previous day: 11/18 0701 - 11/19 0700 In: 1060 [P.O.:960; IV Piggyback:100] Out: 1351 [Urine:1350; Stool:1] Intake/Output this shift: Total I/O In: 120 [P.O.:120] Out: -   Skin:  the skin of the mons pubis is getting softer. There are still about 3 or 4 small papules on the skin but they are not actively draining at this time. Cellulitis is resolving. Minimally tender  Lab Results:   [facility] 12/15/10 0735 12/13/10 0453  WBC 10.2 11.0*  HGB 7.9* 7.8*  HCT 24.1* 24.4*  PLT 428* 457*   BMET  Basename 12/15/10 0735 12/12/10 1959  NA 139 139  K 3.6 3.6  CL 101 105  CO2 26 24  GLUCOSE 116* 128*  BUN 9 9  CREATININE 0.96 1.10  CALCIUM 8.8 8.5   PT/INR No results found for this basename: LABPROT:2,INR:2 in the last 72 hours ABG No results found for this basename: PHART:2,PCO2:2,PO2:2,HCO3:2 in the last 72 hours  Studies/Results: No results found.  Anti-infectives: Anti-infectives     Start     Dose/Rate Route Frequency Ordered Stop   12/15/10 1200   imipenem-cilastatin (PRIMAXIN) 500 mg in sodium chloride 0.9 % 100 mL IVPB        500 mg 200 mL/hr over 30 Minutes Intravenous 4 times per day 12/15/10 1026     12/11/10 1600   vancomycin (VANCOCIN) 1,250 mg in sodium chloride 0.9 % 250 mL IVPB        1,250 mg 166.7 mL/hr over 90 Minutes Intravenous Every 24 hours 12/10/10 0949     12/07/10 1000   imipenem-cilastatin (PRIMAXIN) 500 mg in sodium chloride 0.9 % 100 mL IVPB  Status:  Discontinued        500 mg 200 mL/hr over 30 Minutes Intravenous 3 times per day  12/07/10 0726 12/15/10 1026   12/07/10 0800   vancomycin (VANCOCIN) 1,500 mg in sodium chloride 0.9 % 500 mL IVPB  Status:  Discontinued        1,500 mg 250 mL/hr over 120 Minutes Intravenous Every 24 hours 12/07/10 0714 12/10/10 0947   12/05/10 0600   vancomycin (VANCOCIN) 1,500 mg in sodium chloride 0.9 % 500 mL IVPB  Status:  Discontinued        1,500 mg 250 mL/hr over 120 Minutes Intravenous Every 48 hours 12/04/10 0958 12/06/10 0846   12/03/10 1600   vancomycin (VANCOCIN) IVPB 1000 mg/200 mL premix        1,000 mg 200 mL/hr over 60 Minutes Intravenous  Once 12/03/10 1541 12/03/10 1836   12/03/10 1600   imipenem-cilastatin (PRIMAXIN) 250 mg in sodium chloride 0.9 % 100 mL IVPB  Status:  Discontinued        250 mg 200 mL/hr over 30 Minutes Intravenous Every 6 hours 12/03/10 1541 12/07/10 0726   12/02/10 0600   moxifloxacin (AVELOX) IVPB 400 mg  Status:  Discontinued        400 mg 250 mL/hr over 60 Minutes Intravenous Every 24 hours 12/02/10 0532 12/03/10 1511   12/02/10 0000   moxifloxacin (AVELOX) IVPB 400 mg  Status:  Discontinued        400 mg 250 mL/hr over 60 Minutes Intravenous Daily 12/01/10 2328 12/02/10 0532   12/01/10 2215   moxifloxacin (AVELOX) IVPB 400 mg        400 mg 250 mL/hr over 60 Minutes Intravenous  Once 12/01/10 2209 12/01/10 2324   12/01/10 1915   clindamycin (CLEOCIN) IVPB 600 mg        600 mg 100 mL/hr over 30 Minutes Intravenous  Once 12/01/10 1906 12/01/10 2017          Assessment/Plan: s/p  cellulitis of the mons pubis soft tissue is resolving slowly. There is no indication for surgical drainage or debridement.  Once she is discharged, I would recommend 2 weeks of antibiotics, which should include an antistaphylococcal drug such as doxycycline.  She may be referred to [facility] surgery if needed in the future if surgical issues arise.  We will sign off at this point . Please give [place] a call if we can be of further assistance.  LOS:  14 days    Cam Harnden M 12/15/2010

## 2010-12-16 NOTE — Discharge Summary (Signed)
NAMELASHYRA, Michelle Holmes                  ACCOUNT NO.:  [id number]  MEDICAL RECORD NO.:  [id number]  LOCATION:  [id number]                         FACILITY:  [facility]  PHYSICIAN:  Allegra Lai. Terrence Dupont, M.D. DATE OF BIRTH:  1950-08-06  DATE OF ADMISSION:  12/01/2010 DATE OF DISCHARGE:  12/15/2010                              DISCHARGE SUMMARY   ADMITTING DIAGNOSES: 1. Cellulitis of pubic area, rule out abscess. Uncontrolled diabetes     mellitus. 2. Coronary artery disease, stable, status post PTCA and stenting to     LAD in the past. 3. Hypertension. 4. Morbid obesity. 5. Obstructive sleep apnea. 6. Obesity hypoventilation syndrome. 7. History of transient ischemic attack in the past. 8. Scoliosis. 9. History of congestive heart failure, secondary to diastolic     dysfunction. 10.Degenerative joint disease. 11.Chronic anemia. 12.Mild renal insufficiency.  DISCHARGE DIAGNOSES: 1. Resolving cellulitis/abscess of mons pubis. 2. Insulin requiring diabetes mellitus, well controlled. 3. Coronary artery disease, stable. 4. History of PTCA and stenting to LAD in the past. 5. Hypertension. 6. Morbid obesity. 7. Obstructive sleep apnea. 8. Obesity hypoventilation syndrome. 9. Chronic anemia. 10.History of transient ischemic attack in the past. 11.Scoliosis. 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 13.Degenerative joint disease, status post acute-on-chronic renal     insufficiency. 14.Chronic anemia, stable.  HOME MEDICATIONS: 1. Amlodipine 5 mg 1 tablet daily. 2. Doxycycline 100 mg 1 tablet daily for 2 more weeks. 3. Ferrous sulfate 325 mg 1 tablet twice daily. 4. Lantus insulin 35 units twice daily. 5. Metoprolol 25 mg twice daily. 6. Protonix 40 mg 1 tablet daily. 7. Enteric-coated aspirin 81 mg 1 tablet daily. 8. Losartan 100 mg 1 tablet daily. 9. Crestor 10 mg 1 tablet daily.  The patient has been advised to stop clopidogrel.  Follow up with me  in 1 week.  Follow up with [facility] in 2 weeks.  CONDITION ON DISCHARGE:  Stable.  DISCHARGE INSTRUCTIONS:  Monitor blood pressure and blood sugar daily. The patient has been advised to continue with Sitz baths and lukewarm water baths as in hospital, continue at home.  Follow up with me in 1 week.  BRIEF HISTORY AND HOSPITAL COURSE:  Michelle Holmes is a 60 year old black female with past medical history significant for multiple medical problems, i.e., coronary artery disease, history of PTCA and stenting to LAD in the past, hypertension, insulin-requiring diabetes mellitus, obstructive sleep apnea, morbid obesity, history of TIA in the past, scoliosis history of congestive heart failure secondary to diastolic dysfunction, chronic anemia, mild renal insufficiency, history of CA of colon, remote history of tobacco use.  She came to the ER complaining of generalized swelling and fever for the last 2 days.  The patient also states her blood sugar has been running higher for the last few days. The patient denies any chest pain, nausea, vomiting or diaphoresis. Denies PND, orthopnea or leg swelling.  Denies palpitations, lightheadedness or syncope.  In the ER, the patient was noted to have high-grade fever with blood sugar of 450.  The patient received 2 L of IV fluid and 12 units of insulin without improvement in her blood sugar.  The patient was noted to be tachycardic with heart rate in 120s and was noted to have swelling in the pubic region with erythema.  PAST MEDICAL HISTORY:  As above.  PAST SURGICAL HISTORY:  She had C-section in the past.  Had partial colectomy in the past.  Had appendectomy in the past and had hysterectomy in the past.  SOCIAL HISTORY:  She reports she has quit smoking.  She does not have any smokeless tobacco history on the file.  She reports that she does not drink alcohol or use any illicit drugs.  ALLERGIES:  She is allergic to PENICILLIN  and ACE INHIBITORS, which gives her angioedema.  HOME MEDICATIONS:  Aspirin, Plavix, Toprol, Crestor, losartan, Pepcid, Lantus, amitriptyline and Advair Diskus.  PHYSICAL EXAMINATION:  GENERAL:  She was alert, awake and oriented x3, in no acute distress. VITAL SIGNS:  Blood pressure was 139/59, pulse was 97, temperature was 100.5, O2 saturations were 98%. HEENT:  Conjunctiva was pink. NECK:  Supple.  No JVD.  No bruit. LUNGS:  Clear to auscultation without rales.  CARDIOVASCULAR:  S1 and S2 normal.  There was soft systolic murmur. ABDOMEN:  Soft.  Bowel sounds present. GENITOURINARY:  There was rash in the right labial area and also rash in the left labial area with induration. EXTREMITIES:  There is no clubbing, cyanosis or edema.  LABORATORY DATA:  Sodium was 134, potassium 3.5, BUN 14, creatinine 1.32.  Glucose was 479.  Her albumin was low at 3.1.  Total protein was 7.7, hemoglobin was 10.2, hematocrit 29.3, white count of 11.7.  On December 02, 2010, her sodium was 135, potassium 3.4, BUN 23, creatinine 2.35.  Her hemoglobin was 8.6, hematocrit 26.1.  On December 07, 2010, her potassium was 2.6, which has been replaced.  Her BUN is 13. Creatinine is trending down to baseline 1.33, hemoglobin was 8, hematocrit 24.4, white count of 13.4.  On December 15, 2010, her hemoglobin is 7.9, hematocrit 24.1, which has been stable.  White count is 10.2.  Potassium is 3.6, BUN is 9, creatinine 0.96.  Her culture so far has been negative.  Her blood sugar for the last few days is running in the range of 100-120.  Her EKG showed normal sinus rhythm with no significant acute ischemic changes.  There were some nonspecific T-wave changes in the septal leads.  BRIEF HOSPITAL COURSE:  The patient was admitted to telemetry unit. Pancultures were obtained.  The patient was initially started on IV Avelox.  The patient spiked fever to 101.  IV Avelox was switched to IV vancomycin and Primaxin in  view of allergy to penicillin.  The patient developed further swelling and induration in the right pubic area. Surgical consultation was obtained with [facility].  Their recommendation was to continue with local care and continue broad- spectrum IV antibiotics.  The patient remained afebrile after starting on vanc and Primaxin and with improvement in her blood sugar.  The patient had indurated area in the mons pubis and subsequently had minimal pussie drainage from the pubic region.  The patient was felt not to be the surgical candidate for incision and drainage, and was treated with local care, Sitz baths and IV antibiotics.  The patient has been afebrile the last few days.  Her swelling in the pubic region has improved with no significant drainage left.  The patient will be discharged home on the above medications and will be followed up in my office in 1 week and [facility] in  2 weeks or as needed.  The patient has been advised to call the office if she develops any fever, chills or progressive swelling in the pubic area with discharge.     Allegra Lai. Terrence Dupont, M.D.     MNH/MEDQ  D:  12/15/2010  T:  12/16/2010  Job:  [id number]

## 2011-03-26 ENCOUNTER — Other Ambulatory Visit: Payer: Self-pay | Admitting: Cardiology

## 2011-04-16 ENCOUNTER — Other Ambulatory Visit: Payer: Self-pay | Admitting: Cardiology

## 2011-05-11 ENCOUNTER — Other Ambulatory Visit: Payer: Self-pay | Admitting: Cardiology

## 2011-06-11 ENCOUNTER — Ambulatory Visit: Payer: No Typology Code available for payment source | Attending: Orthopedic Surgery | Admitting: Physical Therapy

## 2011-07-15 ENCOUNTER — Other Ambulatory Visit (HOSPITAL_COMMUNITY): Payer: Self-pay | Admitting: Cardiology

## 2012-02-07 ENCOUNTER — Emergency Department (HOSPITAL_COMMUNITY): Payer: No Typology Code available for payment source

## 2012-02-07 ENCOUNTER — Inpatient Hospital Stay (HOSPITAL_COMMUNITY)
Admission: EM | Admit: 2012-02-07 | Discharge: 2012-02-10 | DRG: 069 | Disposition: A | Discharge status: Home / Self Care | Payer: No Typology Code available for payment source | Attending: Cardiology | Admitting: Cardiology

## 2012-02-07 ENCOUNTER — Encounter (HOSPITAL_COMMUNITY): Payer: Self-pay | Admitting: Emergency Medicine

## 2012-02-07 ENCOUNTER — Inpatient Hospital Stay (HOSPITAL_COMMUNITY): Payer: No Typology Code available for payment source

## 2012-02-07 DIAGNOSIS — N390 Urinary tract infection, site not specified: Secondary | ICD-10-CM | POA: Diagnosis present

## 2012-02-07 DIAGNOSIS — E1100 Type 2 diabetes mellitus with hyperosmolarity without nonketotic hyperglycemic-hyperosmolar coma (NKHHC): Secondary | ICD-10-CM

## 2012-02-07 DIAGNOSIS — N182 Chronic kidney disease, stage 2 (mild): Secondary | ICD-10-CM | POA: Diagnosis present

## 2012-02-07 DIAGNOSIS — E7800 Pure hypercholesterolemia, unspecified: Secondary | ICD-10-CM | POA: Diagnosis present

## 2012-02-07 DIAGNOSIS — M1990 Unspecified osteoarthritis, unspecified site: Secondary | ICD-10-CM | POA: Diagnosis present

## 2012-02-07 DIAGNOSIS — I129 Hypertensive chronic kidney disease with stage 1 through stage 4 chronic kidney disease, or unspecified chronic kidney disease: Secondary | ICD-10-CM | POA: Diagnosis present

## 2012-02-07 DIAGNOSIS — R739 Hyperglycemia, unspecified: Secondary | ICD-10-CM

## 2012-02-07 DIAGNOSIS — R4182 Altered mental status, unspecified: Secondary | ICD-10-CM

## 2012-02-07 DIAGNOSIS — E662 Morbid (severe) obesity with alveolar hypoventilation: Secondary | ICD-10-CM | POA: Diagnosis present

## 2012-02-07 DIAGNOSIS — Z9861 Coronary angioplasty status: Secondary | ICD-10-CM

## 2012-02-07 DIAGNOSIS — G4733 Obstructive sleep apnea (adult) (pediatric): Secondary | ICD-10-CM | POA: Diagnosis present

## 2012-02-07 DIAGNOSIS — D649 Anemia, unspecified: Secondary | ICD-10-CM | POA: Diagnosis present

## 2012-02-07 DIAGNOSIS — IMO0001 Reserved for inherently not codable concepts without codable children: Secondary | ICD-10-CM | POA: Diagnosis present

## 2012-02-07 DIAGNOSIS — M4120 Other idiopathic scoliosis, site unspecified: Secondary | ICD-10-CM | POA: Diagnosis present

## 2012-02-07 DIAGNOSIS — Z87891 Personal history of nicotine dependence: Secondary | ICD-10-CM

## 2012-02-07 DIAGNOSIS — N289 Disorder of kidney and ureter, unspecified: Secondary | ICD-10-CM

## 2012-02-07 DIAGNOSIS — G459 Transient cerebral ischemic attack, unspecified: Principal | ICD-10-CM | POA: Diagnosis present

## 2012-02-07 DIAGNOSIS — I2510 Atherosclerotic heart disease of native coronary artery without angina pectoris: Secondary | ICD-10-CM | POA: Diagnosis present

## 2012-02-07 DIAGNOSIS — J438 Other emphysema: Secondary | ICD-10-CM | POA: Diagnosis present

## 2012-02-07 DIAGNOSIS — Z8673 Personal history of transient ischemic attack (TIA), and cerebral infarction without residual deficits: Secondary | ICD-10-CM

## 2012-02-07 DIAGNOSIS — Z85038 Personal history of other malignant neoplasm of large intestine: Secondary | ICD-10-CM

## 2012-02-07 DIAGNOSIS — R531 Weakness: Secondary | ICD-10-CM

## 2012-02-07 DIAGNOSIS — I5032 Chronic diastolic (congestive) heart failure: Secondary | ICD-10-CM | POA: Diagnosis present

## 2012-02-07 DIAGNOSIS — Z6841 Body Mass Index (BMI) 40.0 and over, adult: Secondary | ICD-10-CM

## 2012-02-07 DIAGNOSIS — I509 Heart failure, unspecified: Secondary | ICD-10-CM | POA: Diagnosis present

## 2012-02-07 HISTORY — DX: Morbid (severe) obesity with alveolar hypoventilation: E66.2

## 2012-02-07 HISTORY — DX: Unspecified osteoarthritis, unspecified site: M19.90

## 2012-02-07 HISTORY — DX: Anemia, unspecified: D64.9

## 2012-02-07 HISTORY — DX: Scoliosis, unspecified: M41.9

## 2012-02-07 HISTORY — DX: Obesity, unspecified: E66.9

## 2012-02-07 HISTORY — DX: Dorsalgia, unspecified: M54.9

## 2012-02-07 HISTORY — DX: Disorder of kidney and ureter, unspecified: N28.9

## 2012-02-07 HISTORY — DX: Sleep apnea, unspecified: G47.30

## 2012-02-07 HISTORY — DX: Other chronic pain: G89.29

## 2012-02-07 HISTORY — DX: Heart failure, unspecified: I50.9

## 2012-02-07 HISTORY — DX: Transient cerebral ischemic attack, unspecified: G45.9

## 2012-02-07 LAB — PROTIME-INR
INR: 1.09 (ref 0.00–1.49)
Prothrombin Time: 14 seconds (ref 11.6–15.2)

## 2012-02-07 LAB — URINALYSIS, ROUTINE W REFLEX MICROSCOPIC
Bilirubin Urine: NEGATIVE
Glucose, UA: >1000 mg/dL — AB
Ketones, ur: NEGATIVE mg/dL
Nitrite: NEGATIVE
Protein, ur: NEGATIVE mg/dL
Specific Gravity, Urine: 1.034 — ABNORMAL HIGH (ref 1.005–1.030)
Urobilinogen, UA: 0.2 mg/dL (ref 0.0–1.0)
pH: 5.5 (ref 5.0–8.0)

## 2012-02-07 LAB — POCT I-STAT, CHEM 8
BUN: 18 mg/dL (ref 6–23)
Calcium, Ion: 1.17 mmol/L (ref 1.13–1.30)
Chloride: 103 mEq/L (ref 96–112)
Creatinine, Ser: 1.4 mg/dL — ABNORMAL HIGH (ref 0.50–1.10)
Glucose, Bld: 477 mg/dL — ABNORMAL HIGH (ref 70–99)
HCT: 39 % (ref 36.0–46.0)
Hemoglobin: 13.3 g/dL (ref 12.0–15.0)
Potassium: 3.8 mEq/L (ref 3.5–5.1)
Sodium: 140 mEq/L (ref 135–145)
TCO2: 27 mmol/L (ref 0–100)

## 2012-02-07 LAB — GLUCOSE, CAPILLARY
Glucose-Capillary: 550 mg/dL — ABNORMAL HIGH (ref 70–99)
Glucose-Capillary: 465 mg/dL — ABNORMAL HIGH (ref 70–99)
Glucose-Capillary: 374 mg/dL — ABNORMAL HIGH (ref 70–99)

## 2012-02-07 LAB — DIFFERENTIAL
Basophils Absolute: 0 10*3/uL (ref 0.0–0.1)
Basophils Relative: 0 % (ref 0–1)
Eosinophils Absolute: 0 10*3/uL (ref 0.0–0.7)
Eosinophils Relative: 0 % (ref 0–5)
Lymphocytes Relative: 33 % (ref 12–46)
Lymphs Abs: 2.7 10*3/uL (ref 0.7–4.0)
Monocytes Absolute: 0.5 10*3/uL (ref 0.1–1.0)
Monocytes Relative: 6 % (ref 3–12)
Neutro Abs: 4.9 10*3/uL (ref 1.7–7.7)
Neutrophils Relative %: 60 % (ref 43–77)

## 2012-02-07 LAB — COMPREHENSIVE METABOLIC PANEL
ALT: 11 U/L (ref 0–35)
AST: 15 U/L (ref 0–37)
Albumin: 3.8 g/dL (ref 3.5–5.2)
Alkaline Phosphatase: 130 U/L — ABNORMAL HIGH (ref 39–117)
BUN: 17 mg/dL (ref 6–23)
CO2: 25 mEq/L (ref 19–32)
Calcium: 9.7 mg/dL (ref 8.4–10.5)
Chloride: 98 mEq/L (ref 96–112)
Creatinine, Ser: 1.38 mg/dL — ABNORMAL HIGH (ref 0.50–1.10)
GFR calc Af Amer: 47 mL/min — ABNORMAL LOW (ref >90)
GFR calc non Af Amer: 41 mL/min — ABNORMAL LOW (ref >90)
Glucose, Bld: 532 mg/dL — ABNORMAL HIGH (ref 70–99)
Potassium: 3.8 mEq/L (ref 3.5–5.1)
Sodium: 137 mEq/L (ref 135–145)
Total Bilirubin: 0.6 mg/dL (ref 0.3–1.2)
Total Protein: 7.9 g/dL (ref 6.0–8.3)

## 2012-02-07 LAB — POCT I-STAT TROPONIN I: Troponin i, poc: 0 ng/mL (ref 0.00–0.08)

## 2012-02-07 LAB — CBC
HCT: 37.1 % (ref 36.0–46.0)
HCT: 37.1 % (ref 36.0–46.0)
Hemoglobin: 12.7 g/dL (ref 12.0–15.0)
Hemoglobin: 13 g/dL (ref 12.0–15.0)
MCH: 29.7 pg (ref 26.0–34.0)
MCH: 30.3 pg (ref 26.0–34.0)
MCHC: 34.2 g/dL (ref 30.0–36.0)
MCHC: 35 g/dL (ref 30.0–36.0)
MCV: 86.9 fL (ref 78.0–100.0)
MCV: 86.5 fL (ref 78.0–100.0)
Platelets: 198 10*3/uL (ref 150–400)
Platelets: 177 10*3/uL (ref 150–400)
RBC: 4.27 MIL/uL (ref 3.87–5.11)
RBC: 4.29 MIL/uL (ref 3.87–5.11)
RDW: 13.2 % (ref 11.5–15.5)
RDW: 13.2 % (ref 11.5–15.5)
WBC: 8.2 10*3/uL (ref 4.0–10.5)
WBC: 8.3 10*3/uL (ref 4.0–10.5)

## 2012-02-07 LAB — TROPONIN I: Troponin I: <0.3 ng/mL (ref <0.30)

## 2012-02-07 LAB — URINE MICROSCOPIC-ADD ON

## 2012-02-07 LAB — CREATININE, SERUM
Creatinine, Ser: 1.34 mg/dL — ABNORMAL HIGH (ref 0.50–1.10)
GFR calc Af Amer: 49 mL/min — ABNORMAL LOW (ref >90)
GFR calc non Af Amer: 42 mL/min — ABNORMAL LOW (ref >90)

## 2012-02-07 LAB — ETHANOL: Alcohol, Ethyl (B): <11 mg/dL (ref 0–11)

## 2012-02-07 LAB — APTT: aPTT: 29 seconds (ref 24–37)

## 2012-02-07 LAB — RAPID URINE DRUG SCREEN, HOSP PERFORMED
Amphetamines: NOT DETECTED
Barbiturates: NOT DETECTED
Benzodiazepines: NOT DETECTED
Cocaine: NOT DETECTED
Opiates: NOT DETECTED
Tetrahydrocannabinol: NOT DETECTED

## 2012-02-07 MED ORDER — LORAZEPAM 2 MG/ML IJ SOLN
1.0000 mg | Freq: Once | INTRAMUSCULAR | Status: AC
  Administered 2012-02-07: 1 mg via INTRAVENOUS
  Filled 2012-02-07: qty 1

## 2012-02-07 MED ORDER — ASPIRIN EC 81 MG PO TBEC
81.0000 mg | DELAYED_RELEASE_TABLET | Freq: Every day | ORAL | Status: DC
  Administered 2012-02-08 – 2012-02-10 (×3): 81 mg via ORAL
  Filled 2012-02-07 (×3): qty 1

## 2012-02-07 MED ORDER — SODIUM CHLORIDE 0.9 % IV BOLUS (SEPSIS)
1000.0000 mL | Freq: Once | INTRAVENOUS | Status: DC
  Administered 2012-02-07: 1000 mL via INTRAVENOUS

## 2012-02-07 MED ORDER — CLOPIDOGREL BISULFATE 75 MG PO TABS
75.0000 mg | ORAL_TABLET | Freq: Every day | ORAL | Status: DC
  Administered 2012-02-08 – 2012-02-10 (×3): 75 mg via ORAL
  Filled 2012-02-07 (×3): qty 1

## 2012-02-07 MED ORDER — HEPARIN SODIUM (PORCINE) 5000 UNIT/ML IJ SOLN
5000.0000 [IU] | Freq: Three times a day (TID) | INTRAMUSCULAR | Status: DC
  Administered 2012-02-07 – 2012-02-10 (×8): 5000 [IU] via SUBCUTANEOUS
  Filled 2012-02-07 (×11): qty 1

## 2012-02-07 MED ORDER — DEXTROSE 5 % IV SOLN
1.0000 g | INTRAVENOUS | Status: DC
  Administered 2012-02-07 – 2012-02-08 (×2): 1 g via INTRAVENOUS
  Filled 2012-02-07 (×3): qty 10

## 2012-02-07 MED ORDER — INSULIN ASPART 100 UNIT/ML ~~LOC~~ SOLN: 0.0000 [IU] | Freq: Three times a day (TID) | SUBCUTANEOUS | Status: DC

## 2012-02-07 MED ORDER — ATORVASTATIN CALCIUM 40 MG PO TABS
40.0000 mg | ORAL_TABLET | Freq: Every day | ORAL | Status: DC
  Administered 2012-02-08 – 2012-02-09 (×2): 40 mg via ORAL
  Filled 2012-02-07 (×3): qty 1

## 2012-02-07 MED ORDER — LORAZEPAM 2 MG/ML IJ SOLN
1.0000 mg | Freq: Once | INTRAMUSCULAR | Status: AC | PRN
  Administered 2012-02-07: 1 mg via INTRAVENOUS
  Filled 2012-02-07: qty 1

## 2012-02-07 MED ORDER — LOSARTAN POTASSIUM 50 MG PO TABS: 50.0000 mg | ORAL_TABLET | Freq: Every day | ORAL | Status: DC

## 2012-02-07 MED ORDER — INSULIN GLARGINE 100 UNIT/ML ~~LOC~~ SOLN
30.0000 [IU] | Freq: Two times a day (BID) | SUBCUTANEOUS | Status: DC
  Administered 2012-02-07 – 2012-02-08 (×3): 30 [IU] via SUBCUTANEOUS

## 2012-02-07 MED ORDER — ASPIRIN 300 MG RE SUPP: 300.0000 mg | Freq: Every day | RECTAL | Status: DC

## 2012-02-07 MED ORDER — ASPIRIN 325 MG PO TABS: 325.0000 mg | ORAL_TABLET | Freq: Every day | ORAL | Status: DC

## 2012-02-07 MED ORDER — INSULIN ASPART 100 UNIT/ML ~~LOC~~ SOLN
5.0000 [IU] | Freq: Once | SUBCUTANEOUS | Status: AC
  Administered 2012-02-07: 5 [IU] via SUBCUTANEOUS
  Filled 2012-02-07: qty 1

## 2012-02-07 MED ORDER — FERROUS SULFATE 325 (65 FE) MG PO TABS
325.0000 mg | ORAL_TABLET | Freq: Two times a day (BID) | ORAL | Status: DC
Start: 2012-02-08 — End: 2012-02-10
  Administered 2012-02-08 – 2012-02-10 (×4): 325 mg via ORAL
  Filled 2012-02-07 (×7): qty 1

## 2012-02-07 MED ORDER — SENNOSIDES-DOCUSATE SODIUM 8.6-50 MG PO TABS
1.0000 | ORAL_TABLET | Freq: Every evening | ORAL | Status: DC | PRN
  Filled 2012-02-07: qty 1

## 2012-02-07 MED ORDER — SODIUM CHLORIDE 0.9 % IV BOLUS (SEPSIS)
500.0000 mL | Freq: Once | INTRAVENOUS | Status: AC
  Administered 2012-02-07: 500 mL via INTRAVENOUS

## 2012-02-07 MED ORDER — PANTOPRAZOLE SODIUM 40 MG PO TBEC
40.0000 mg | DELAYED_RELEASE_TABLET | Freq: Every day | ORAL | Status: DC
  Administered 2012-02-08 – 2012-02-10 (×3): 40 mg via ORAL
  Filled 2012-02-07 (×4): qty 1

## 2012-02-07 MED ORDER — DEXTROSE 5 % IV SOLN: 1.0000 g | INTRAVENOUS | Status: DC

## 2012-02-07 MED ORDER — LOSARTAN POTASSIUM 50 MG PO TABS
50.0000 mg | ORAL_TABLET | Freq: Every day | ORAL | Status: DC
  Administered 2012-02-08 – 2012-02-10 (×3): 50 mg via ORAL
  Filled 2012-02-07 (×3): qty 1

## 2012-02-07 MED ORDER — METOPROLOL SUCCINATE ER 50 MG PO TB24
50.0000 mg | ORAL_TABLET | Freq: Every day | ORAL | Status: DC
  Administered 2012-02-08 – 2012-02-10 (×3): 50 mg via ORAL
  Filled 2012-02-07 (×3): qty 1

## 2012-02-07 MED ORDER — ASPIRIN 81 MG PO TABS: 81.0000 mg | ORAL_TABLET | Freq: Every day | ORAL | Status: DC

## 2012-02-07 MED ORDER — SODIUM CHLORIDE 0.9 % IV SOLN
INTRAVENOUS | Status: DC
  Administered 2012-02-07 – 2012-02-09 (×3): via INTRAVENOUS

## 2012-02-07 NOTE — ED Notes (Signed)
Patient transported to MRI 

## 2012-02-07 NOTE — ED Notes (Signed)
Per [facility], patient was well yesterday at 1015a.m.   Patient started having facial droop, problems walking, problems talking and high blood sugar yesterday at 1030a.m.   Patient was taken to urgent care and seen/released home.

## 2012-02-07 NOTE — ED Notes (Signed)
During swallowing eval - when attempting to drink from cup water noted to spill out rt side of mouth - occurred x2 - pt   did not choke/cough but took long time swallowing .

## 2012-02-07 NOTE — ED Notes (Signed)
Family back into room - informed of swallowing screen failure and fact pt is to remain NPO- note has been placed on door

## 2012-02-07 NOTE — ED Notes (Signed)
Received call from CT - pt refusing to have CT performed, stated that she is claustophobic. Pt to be returned to room at this time

## 2012-02-07 NOTE — ED Notes (Signed)
[facility], RN, notified patient refusing CT due to claustrophobic- Appears anxious in CT and refusing exam at this time.  Stressed to her that the CT was important to her care, but she is anxious.  Difficult to understand, but think she related that she was sedated for MRI.

## 2012-02-07 NOTE — ED Notes (Signed)
Pt still out of room at this time , CT and radiology tests being performed

## 2012-02-07 NOTE — ED Notes (Signed)
Daughter and family informed that Ativan will be administered just prior to pt going for MRI- presently awaiting return call from MRI to determine when they will be over to collect pt for test. Side rails up , bed low and locked, family at bedside

## 2012-02-07 NOTE — ED Notes (Signed)
Dr Elise Benne informed of results of RN swallowing screen

## 2012-02-07 NOTE — ED Provider Notes (Signed)
History     CSN: [id number]  Arrival date & time 02/07/12  38   First MD Initiated Contact with Patient 02/07/12 1029      Chief Complaint  Patient presents with  . Facial Droop  . Aphasia  . Hyperglycemia  . Gait Problem     HPI Pt was seen at 1050.   Per pt's family and pt, c/o gradual onset and persistence of constant slurred speech, left facial droop and right sided weakness since yesterday morning.  Pt's family states they took her to an [place] yesterday approx noon, and was told to "take her home and let her rest."  Pt's family states pt's "right arm keeps twitching," she has been having difficulty walking, and has had poor appetite for the past several days. Pt states when she takes PO the food "comes out of" the left side of her mouth.  Denies falling, no fevers, no CP/SOB, no abd pain, no N/V/D.      Past Medical History  Diagnosis Date  . Emphysema   . Colon cancer   . Hypertension   . Hypercholesteremia   . Diabetes mellitus   . COPD (chronic obstructive pulmonary disease)   . Coronary artery disease   . Stroke   . Obesity   . Sleep apnea   . TIA (transient ischemic attack)   . CHF (congestive heart failure)   . Anemia   . DJD (degenerative joint disease)   . Renal insufficiency   . Chronic back pain   . Scoliosis   . Obesity hypoventilation syndrome     Past Surgical History  Procedure Date  . Coronary angioplasty with stent placement      History  Substance Use Topics  . Smoking status: Former Research scientist (life sciences)  . Smokeless tobacco: Not on file  . Alcohol Use: No      Review of Systems ROS: Statement: All systems negative except as marked or noted in the HPI; Constitutional: Negative for fever and chills. ; ; Eyes: Negative for eye pain, redness and discharge. ; ; ENMT: Negative for ear pain, hoarseness, nasal congestion, sinus pressure and sore throat. ; ; Cardiovascular: Negative for chest pain, palpitations, diaphoresis, dyspnea and peripheral edema. ;  ; Respiratory: Negative for cough, wheezing and stridor. ; ; Gastrointestinal: Negative for nausea, vomiting, diarrhea, abdominal pain, blood in stool, hematemesis, jaundice and rectal bleeding. . ; ; Genitourinary: Negative for dysuria, flank pain and hematuria. ; ; Musculoskeletal: Negative for back pain and neck pain. Negative for swelling and trauma.; ; Skin: Negative for pruritus, rash, abrasions, blisters, bruising and skin lesion.; ; Neuro: +slurred speech, right sided weakness. Negative for headache, lightheadedness and neck stiffness. Negative for weakness, altered level of consciousness , altered mental status, paresthesias, involuntary movement, seizure and syncope.       Allergies  Penicillins  Home Medications   Current Outpatient Rx  Name  Route  Sig  Dispense  Refill  . ASPIRIN 81 MG PO TABS   Oral   Take 81 mg by mouth daily.          Marland Kitchen CLOPIDOGREL BISULFATE 75 MG PO TABS   Oral   Take 75 mg by mouth daily.         Marland Kitchen LOSARTAN POTASSIUM 100 MG PO TABS   Oral   Take 100 mg by mouth daily.          Marland Kitchen METOPROLOL SUCCINATE ER 100 MG PO TB24   Oral   Take 100 mg by  mouth daily. Take with or immediately following a meal.         . ROSUVASTATIN CALCIUM 10 MG PO TABS   Oral   Take 10 mg by mouth daily.          Marland Kitchen AMLODIPINE BESYLATE 5 MG PO TABS   Oral   Take 1 tablet (5 mg total) by mouth daily.   30 tablet   3   . FERROUS SULFATE 325 (65 FE) MG PO TABS   Oral   Take 1 tablet (325 mg total) by mouth 2 (two) times daily with a meal.   60 tablet   3   . INSULIN GLARGINE 100 UNIT/ML [place] SOLN   Subcutaneous   Inject 42 Units into the skin 2 (two) times daily.         Marland Kitchen METOPROLOL TARTRATE 25 MG PO TABS   Oral   Take 1 tablet (25 mg total) by mouth 2 (two) times daily.   60 tablet   3   . PANTOPRAZOLE SODIUM 40 MG PO TBEC   Oral   Take 1 tablet (40 mg total) by mouth daily at 6 (six) AM.   30 tablet   3     BP 171/83  Pulse 81  Temp 100 F  (37.8 C) (Rectal)  Resp 22  SpO2 94%  Physical Exam 1055: Physical examination:  Nursing notes reviewed; Vital signs and O2 SAT reviewed;  Constitutional: Well developed, Well nourished, Well hydrated, In no acute distress; Head:  Normocephalic, atraumatic; Eyes: EOMI, PERRL, No scleral icterus; ENMT: Mouth and pharynx normal, Mucous membranes moist; Neck: Supple, Full range of motion, No lymphadenopathy; Cardiovascular: Regular rate and rhythm, No gallop; Respiratory: Breath sounds clear & equal bilaterally, No rales, rhonchi, wheezes.  Speaking full sentences with ease, Normal respiratory effort/excursion; Chest: Nontender, Movement normal; Abdomen: Soft, Nontender, Nondistended, Normal bowel sounds;; Extremities: Pulses normal, No tenderness, No edema, No calf edema or asymmetry.; Neuro: Awake, alert, knows family at bedside, Major CN grossly intact.  +left lower facial droop with forehead sparing. Speech slurred.  Strength 5/5 LUE and LLE, 4/5 RUE and RLE. No drift x4 extremities.  Right < left grip. No gross sensory deficits.  Pt can cross her ankles but does not seem to comprehend finger-nose and heel-shin (will lift right arm when asked to bend right knee).; Skin: Color normal, Warm, Dry.   ED Course  Procedures   1100:  Pt not code stroke and not eligible for tPA in stroke due to the following: onset over 3-4.5 hours (LSN yesterday morning).  1320:  Pt refusing to go to CT scan despite multiple family members and ED staff's explanation of the procedure.  Pt finally agreed to have CT scan performed, has now returned to her exam room.  ED RN began swallow screen: while attempting to drink water it spilled out of the left side of her mouth, but she did not choke or gag.  Pt now refusing MRI.   1425:  New family member at bedside now.  Dx and testing d/w pt and family.  Questions answered.  Verb understanding.  Pt continues to refuse MRI.  Pt rolling around the stretcher and gesturing with RUE  to family while yelling in clearer voice "I'm not doing it!"  T/C to Neurology Dr. [name] Kindred, case discussed, including:  HPI, pertinent PM/SHx, VS/PE, dx testing, ED course and treatment:  Agrees pt needs MRI.  While I was on the phone with him, the new family member  convinced pt to have the MRI completed "with something to relax her" (ativan).  Dr. [name] Kindred informed.  Requests to call him after MRI completed.  Family aware of plan of care.   1515:  MRI cannot take pt until after 1700 today.  Family and pt aware.  Will tx URI with IV cipro (allergic to PCN) while UC pending.  Glucose elevated per hx of DM, but not acidotic with AG 14. Will dose SQ insulin and judicious IVF due to pt hx CHF.    1730:  MRI pending; Neuro MD to be called with results. Pt will need admit to Dr. Zenia Resides service.  Sign out to Dr. Ashok Cordia.    MDM  MDM Reviewed: nursing note, vitals and previous chart Reviewed previous: ECG and labs Interpretation: ECG, labs, x-ray and CT scan    Date: 02/07/2012  Rate: 80  Rhythm: normal sinus rhythm  QRS Axis: normal  Intervals: normal  ST/T Wave abnormalities: normal  Conduction Disutrbances:none  Narrative Interpretation:   Old EKG Reviewed: unchanged; no significant changes from previous EKG dated 12/12/2010.  Results for orders placed during the hospital encounter of 02/07/12  GLUCOSE, CAPILLARY      Component Value Range   Glucose-Capillary 550 (*) 70 - 99 mg/dL   Comment 1 Documented in Chart     Comment 2 Notify RN    ETHANOL      Component Value Range   Alcohol, Ethyl (B) <11  0 - 11 mg/dL  PROTIME-INR      Component Value Range   Prothrombin Time 14.0  11.6 - 15.2 seconds   INR 1.09  0.00 - 1.49  APTT      Component Value Range   aPTT 29  24 - 37 seconds  CBC      Component Value Range   WBC 8.2  4.0 - 10.5 K/uL   RBC 4.27  3.87 - 5.11 MIL/uL   Hemoglobin 12.7  12.0 - 15.0 g/dL   HCT 37.1  36.0 - 46.0 %   MCV 86.9  78.0 - 100.0 fL   MCH 29.7  26.0 -  34.0 pg   MCHC 34.2  30.0 - 36.0 g/dL   RDW 13.2  11.5 - 15.5 %   Platelets 198  150 - 400 K/uL  DIFFERENTIAL      Component Value Range   Neutrophils Relative 60  43 - 77 %   Neutro Abs 4.9  1.7 - 7.7 K/uL   Lymphocytes Relative 33  12 - 46 %   Lymphs Abs 2.7  0.7 - 4.0 K/uL   Monocytes Relative 6  3 - 12 %   Monocytes Absolute 0.5  0.1 - 1.0 K/uL   Eosinophils Relative 0  0 - 5 %   Eosinophils Absolute 0.0  0.0 - 0.7 K/uL   Basophils Relative 0  0 - 1 %   Basophils Absolute 0.0  0.0 - 0.1 K/uL  COMPREHENSIVE METABOLIC PANEL      Component Value Range   Sodium 137  135 - 145 mEq/L   Potassium 3.8  3.5 - 5.1 mEq/L   Chloride 98  96 - 112 mEq/L   CO2 25  19 - 32 mEq/L   Glucose, Bld 532 (*) 70 - 99 mg/dL   BUN 17  6 - 23 mg/dL   Creatinine, Ser 1.38 (*) 0.50 - 1.10 mg/dL   Calcium 9.7  8.4 - 10.5 mg/dL   Total Protein 7.9  6.0 - 8.3 g/dL  Albumin 3.8  3.5 - 5.2 g/dL   AST 15  0 - 37 U/L   ALT 11  0 - 35 U/L   Alkaline Phosphatase 130 (*) 39 - 117 U/L   Total Bilirubin 0.6  0.3 - 1.2 mg/dL   GFR calc non Af Amer 41 (*) >90 mL/min   GFR calc Af Amer 47 (*) >90 mL/min  URINE RAPID DRUG SCREEN (HOSP PERFORMED)      Component Value Range   Opiates NONE DETECTED  NONE DETECTED   Cocaine NONE DETECTED  NONE DETECTED   Benzodiazepines NONE DETECTED  NONE DETECTED   Amphetamines NONE DETECTED  NONE DETECTED   Tetrahydrocannabinol NONE DETECTED  NONE DETECTED   Barbiturates NONE DETECTED  NONE DETECTED  URINALYSIS, ROUTINE W REFLEX MICROSCOPIC      Component Value Range   Color, Urine YELLOW  YELLOW   APPearance TURBID (*) CLEAR   Specific Gravity, Urine 1.034 (*) 1.005 - 1.030   pH 5.5  5.0 - 8.0   Glucose, UA >1000 (*) NEGATIVE mg/dL   Hgb urine dipstick MODERATE (*) NEGATIVE   Bilirubin Urine NEGATIVE  NEGATIVE   Ketones, ur NEGATIVE  NEGATIVE mg/dL   Protein, ur NEGATIVE  NEGATIVE mg/dL   Urobilinogen, UA 0.2  0.0 - 1.0 mg/dL   Nitrite NEGATIVE  NEGATIVE   Leukocytes,  UA MODERATE (*) NEGATIVE  TROPONIN I      Component Value Range   Troponin I <0.30  <0.30 ng/mL  URINE MICROSCOPIC-ADD ON      Component Value Range   Squamous Epithelial / LPF FEW (*) RARE   WBC, UA TOO NUMEROUS TO COUNT  <3 WBC/hpf   RBC / HPF 3-6  <3 RBC/hpf   Urine-Other MANY YEAST    POCT I-STAT TROPONIN I      Component Value Range   Troponin i, poc 0.00  0.00 - 0.08 ng/mL   Comment 3           POCT I-STAT, CHEM 8      Component Value Range   Sodium 140  135 - 145 mEq/L   Potassium 3.8  3.5 - 5.1 mEq/L   Chloride 103  96 - 112 mEq/L   BUN 18  6 - 23 mg/dL   Creatinine, Ser 1.40 (*) 0.50 - 1.10 mg/dL   Glucose, Bld 477 (*) 70 - 99 mg/dL   Calcium, Ion 1.17  1.13 - 1.30 mmol/L   TCO2 27  0 - 100 mmol/L   Hemoglobin 13.3  12.0 - 15.0 g/dL   HCT 39.0  36.0 - 46.0 %   Dg Chest 2 View 02/07/2012  *RADIOLOGY REPORT*  Clinical Data: Slurred speech and weakness.  CHEST - 2 VIEW  Comparison: Chest x-ray of 12/01/2010.  Findings: Film is slightly under penetrated.  No acute consolidative airspace disease.  No definite pleural effusions. Mild crowding of the pulmonary vasculature, accentuated by low lung volumes, without frank pulmonary edema.  Heart size is mildly enlarged.  Mediastinal contours are unremarkable.  S-shaped scoliosis of the thoracolumbar spine convex to the left superiorly and to the right inferiorly.  IMPRESSION: 1.  Low lung volumes without radiographic evidence of acute cardiopulmonary disease. 2.  Mild cardiomegaly.   Original Report Authenticated By: Vinnie Langton, M.D.    Ct Head Wo Contrast 02/07/2012  *RADIOLOGY REPORT*  Clinical Data: Facial droop.  Aphasia.  CT HEAD WITHOUT CONTRAST  Technique:  Contiguous axial images were obtained from the base of the skull  through the vertex without contrast.  Comparison: 10/14/2009  Findings: There is patchy low density within the the subcortical and periventricular white matter consistent with chronic small vessel ischemic  change.  There is prominence of the sulci and ventricles consistent with brain atrophy.  There is no evidence for acute brain infarct, hemorrhage or mass.  The paranasal sinuses and the mastoid air cells are clear.  The skull is intact.  IMPRESSION: 1. Chronic small vessel ischemic change. 2.  No acute intracranial abnormalities.   Original Report Authenticated By: Kerby Moors, M.D.     Results for YUKA, SIRAVO (MRN [id number]) as of 02/07/2012 15:36  Ref. Range 12/12/2010 19:59 12/15/2010 07:35 02/07/2012 12:52 02/07/2012 13:19  BUN Latest Range: 6-23 mg/dL 9 9 17 18   Creatinine Latest Range: 0.50-1.10 mg/dL 1.10 0.96 1.38 (H) 1.40 (H)               Alfonzo Feller, DO 02/08/12 1103

## 2012-02-07 NOTE — ED Notes (Signed)
Dr Elise Benne into room to talk to pt and family members.

## 2012-02-07 NOTE — H&P (Signed)
Michelle Holmes is an 62 y.o. female.   Chief Complaint: Right leg and arm cramping questionable weakness/slurred speech HPI: Patient is 62 year old female with past medical history significant for multiple medical problems i.e. coronary artery disease history of PTCA stenting to LAD in the past, insulin requiring diabetes mellitus, hypertension, obstructive sleep apnea, obesity hypoventilation syndrome, history of TIA in the past, scoliosis, history of congestive heart failure secondary to diastolic dysfunction, degenerative joint disease, chronic anemia, mild renal insufficiency stage II, came to the ER by EMS because of cramping/weakness and right upper and lower extremity since yesterday off and on associated with slurred speech since this morning. Patient denies any chest pain shortness of breath nausea vomiting diaphoresis. Denies any palpitation lightheadedness or syncope. Denies any seizure activity. Denies history of PND orthopnea leg swelling. States her sugar has been running very high for last 2 days associated with polyuria. But denies any dysuria. Denies any fever or chills. MRI of the brain done in the ER showed questionable very small right lower cerebellum infarct and chronic small vessel disease. Patient also was noted to have blood sugar of 500 and urinalysis suggestive of UTI.  Past Medical History  Diagnosis Date  . Emphysema   . Colon cancer   . Hypertension   . Hypercholesteremia   . Diabetes mellitus   . COPD (chronic obstructive pulmonary disease)   . Coronary artery disease   . Stroke   . Obesity   . Sleep apnea   . TIA (transient ischemic attack)   . CHF (congestive heart failure)   . Anemia   . DJD (degenerative joint disease)   . Renal insufficiency   . Chronic back pain   . Scoliosis   . Obesity hypoventilation syndrome     Past Surgical History  Procedure Date  . Coronary angioplasty with stent placement     No family history on file. Social History:   reports that she has quit smoking. She does not have any smokeless tobacco history on file. She reports that she does not drink alcohol or use illicit drugs.  Allergies:  Allergies  Allergen Reactions  . Penicillins Hives     (Not in a hospital admission)  Results for orders placed during the hospital encounter of 02/07/12 (from the past 48 hour(s))  GLUCOSE, CAPILLARY     Status: Abnormal   Collection Time   02/07/12 10:47 AM      Component Value Range Comment   Glucose-Capillary 550 (*) 70 - 99 mg/dL    Comment 1 Documented in Chart      Comment 2 Notify RN     URINALYSIS, ROUTINE W REFLEX MICROSCOPIC     Status: Abnormal   Collection Time   02/07/12 11:49 AM      Component Value Range Comment   Color, Urine YELLOW  YELLOW    APPearance TURBID (*) CLEAR    Specific Gravity, Urine 1.034 (*) 1.005 - 1.030    pH 5.5  5.0 - 8.0    Glucose, UA >1000 (*) NEGATIVE mg/dL    Hgb urine dipstick MODERATE (*) NEGATIVE    Bilirubin Urine NEGATIVE  NEGATIVE    Ketones, ur NEGATIVE  NEGATIVE mg/dL    Protein, ur NEGATIVE  NEGATIVE mg/dL    Urobilinogen, UA 0.2  0.0 - 1.0 mg/dL    Nitrite NEGATIVE  NEGATIVE    Leukocytes, UA MODERATE (*) NEGATIVE   URINE MICROSCOPIC-ADD ON     Status: Abnormal   Collection Time  02/07/12 11:49 AM      Component Value Range Comment   Squamous Epithelial / LPF FEW (*) RARE    WBC, UA TOO NUMEROUS TO COUNT  <3 WBC/hpf    RBC / HPF 3-6  <3 RBC/hpf    Urine-Other MANY YEAST     URINE RAPID DRUG SCREEN (HOSP PERFORMED)     Status: Normal   Collection Time   02/07/12 11:51 AM      Component Value Range Comment   Opiates NONE DETECTED  NONE DETECTED    Cocaine NONE DETECTED  NONE DETECTED    Benzodiazepines NONE DETECTED  NONE DETECTED    Amphetamines NONE DETECTED  NONE DETECTED    Tetrahydrocannabinol NONE DETECTED  NONE DETECTED    Barbiturates NONE DETECTED  NONE DETECTED   ETHANOL     Status: Normal   Collection Time   02/07/12 12:52 PM       Component Value Range Comment   Alcohol, Ethyl (B) <11  0 - 11 mg/dL   PROTIME-INR     Status: Normal   Collection Time   02/07/12 12:52 PM      Component Value Range Comment   Prothrombin Time 14.0  11.6 - 15.2 seconds    INR 1.09  0.00 - 1.49   APTT     Status: Normal   Collection Time   02/07/12 12:52 PM      Component Value Range Comment   aPTT 29  24 - 37 seconds   CBC     Status: Normal   Collection Time   02/07/12 12:52 PM      Component Value Range Comment   WBC 8.2  4.0 - 10.5 K/uL    RBC 4.27  3.87 - 5.11 MIL/uL    Hemoglobin 12.7  12.0 - 15.0 g/dL    HCT 37.1  36.0 - 46.0 %    MCV 86.9  78.0 - 100.0 fL    MCH 29.7  26.0 - 34.0 pg    MCHC 34.2  30.0 - 36.0 g/dL    RDW 13.2  11.5 - 15.5 %    Platelets 198  150 - 400 K/uL   DIFFERENTIAL     Status: Normal   Collection Time   02/07/12 12:52 PM      Component Value Range Comment   Neutrophils Relative 60  43 - 77 %    Neutro Abs 4.9  1.7 - 7.7 K/uL    Lymphocytes Relative 33  12 - 46 %    Lymphs Abs 2.7  0.7 - 4.0 K/uL    Monocytes Relative 6  3 - 12 %    Monocytes Absolute 0.5  0.1 - 1.0 K/uL    Eosinophils Relative 0  0 - 5 %    Eosinophils Absolute 0.0  0.0 - 0.7 K/uL    Basophils Relative 0  0 - 1 %    Basophils Absolute 0.0  0.0 - 0.1 K/uL   COMPREHENSIVE METABOLIC PANEL     Status: Abnormal   Collection Time   02/07/12 12:52 PM      Component Value Range Comment   Sodium 137  135 - 145 mEq/L    Potassium 3.8  3.5 - 5.1 mEq/L    Chloride 98  96 - 112 mEq/L    CO2 25  19 - 32 mEq/L    Glucose, Bld 532 (*) 70 - 99 mg/dL    BUN 17  6 - 23 mg/dL  Creatinine, Ser 1.38 (*) 0.50 - 1.10 mg/dL    Calcium 9.7  8.4 - 10.5 mg/dL    Total Protein 7.9  6.0 - 8.3 g/dL    Albumin 3.8  3.5 - 5.2 g/dL    AST 15  0 - 37 U/L    ALT 11  0 - 35 U/L    Alkaline Phosphatase 130 (*) 39 - 117 U/L    Total Bilirubin 0.6  0.3 - 1.2 mg/dL    GFR calc non Af Amer 41 (*) >90 mL/min    GFR calc Af Amer 47 (*) >90 mL/min   TROPONIN  I     Status: Normal   Collection Time   02/07/12 12:52 PM      Component Value Range Comment   Troponin I <0.30  <0.30 ng/mL   POCT I-STAT TROPONIN I     Status: Normal   Collection Time   02/07/12  1:16 PM      Component Value Range Comment   Troponin i, poc 0.00  0.00 - 0.08 ng/mL    Comment 3            POCT I-STAT, CHEM 8     Status: Abnormal   Collection Time   02/07/12  1:19 PM      Component Value Range Comment   Sodium 140  135 - 145 mEq/L    Potassium 3.8  3.5 - 5.1 mEq/L    Chloride 103  96 - 112 mEq/L    BUN 18  6 - 23 mg/dL    Creatinine, Ser 1.40 (*) 0.50 - 1.10 mg/dL    Glucose, Bld 477 (*) 70 - 99 mg/dL    Calcium, Ion 1.17  1.13 - 1.30 mmol/L    TCO2 27  0 - 100 mmol/L    Hemoglobin 13.3  12.0 - 15.0 g/dL    HCT 39.0  36.0 - 46.0 %   GLUCOSE, CAPILLARY     Status: Abnormal   Collection Time   02/07/12  3:51 PM      Component Value Range Comment   Glucose-Capillary 465 (*) 70 - 99 mg/dL    Dg Chest 2 View  02/07/2012  *RADIOLOGY REPORT*  Clinical Data: Slurred speech and weakness.  CHEST - 2 VIEW  Comparison: Chest x-ray of 12/01/2010.  Findings: Film is slightly under penetrated.  No acute consolidative airspace disease.  No definite pleural effusions. Mild crowding of the pulmonary vasculature, accentuated by low lung volumes, without frank pulmonary edema.  Heart size is mildly enlarged.  Mediastinal contours are unremarkable.  S-shaped scoliosis of the thoracolumbar spine convex to the left superiorly and to the right inferiorly.  IMPRESSION: 1.  Low lung volumes without radiographic evidence of acute cardiopulmonary disease. 2.  Mild cardiomegaly.   Original Report Authenticated By: Vinnie Langton, M.D.    Ct Head Wo Contrast  02/07/2012  *RADIOLOGY REPORT*  Clinical Data: Facial droop.  Aphasia.  CT HEAD WITHOUT CONTRAST  Technique:  Contiguous axial images were obtained from the base of the skull through the vertex without contrast.  Comparison: 10/14/2009   Findings: There is patchy low density within the the subcortical and periventricular white matter consistent with chronic small vessel ischemic change.  There is prominence of the sulci and ventricles consistent with brain atrophy.  There is no evidence for acute brain infarct, hemorrhage or mass.  The paranasal sinuses and the mastoid air cells are clear.  The skull is intact.  IMPRESSION: 1. Chronic  small vessel ischemic change. 2.  No acute intracranial abnormalities.   Original Report Authenticated By: Kerby Moors, M.D.    Mr Brain Ltd W/o Cm  02/07/2012  *RADIOLOGY REPORT*  Clinical Data: 62 year old female with slurred speech, difficulty swallowing, right arm tremor.  MRI HEAD WITHOUT CONTRAST  Technique:  Multiplanar, multiecho pulse sequences of the brain and surrounding structures were obtained according to standard protocol without intravenous contrast.  Comparison: Head CT without contrast 1221 hours the same day. Brain MRI 06/05/2005.  Findings: The examination had to be discontinued prior to completion due to the patient's inability to cooperate.  Cerebral volume is mildly decreased since 2007. Major intracranial vascular flow voids are stable with intracranial dolichoectasia again evident.   Diffusion weighted imaging is mildly heterogeneous.  There is evidence of a punctate area of restricted diffusion in the right lower cerebellum on series 3 image 6 (series 300 image 6).  No other convincing area restricted effusion is identified.  Chronic lacunar infarcts in the left corona radiata extending to the external capsule.  Scattered other chronic cerebral white matter T2 hyperintensity.  Negative pituitary, cervicomedullary junction and visualized cervical spine.  Stable bone marrow signal. Grossly stable scalp soft tissues.  Grossly stable orbits, paranasal sinuses and mastoids.  IMPRESSION: 1.  The only area of possible acute infarct identified is a punctate focus in the right lower cerebellum.   No mass effect can imaged. 2.  Otherwise grossly stable chronic small vessel disease since 2007. 3. The examination had to be discontinued prior to completion .   Original Report Authenticated By: Roselyn Reef, M.D.     Review of Systems  Constitutional: Negative for fever and chills.  Eyes: Negative for blurred vision and double vision.  Respiratory: Negative for cough and hemoptysis.   Cardiovascular: Negative for chest pain, palpitations, orthopnea, claudication and leg swelling.  Gastrointestinal: Negative for nausea, vomiting and abdominal pain.  Genitourinary: Positive for urgency and frequency. Negative for dysuria.  Neurological: Positive for speech change and headaches. Negative for seizures and loss of consciousness.    Blood pressure 172/106, pulse 73, temperature 99.6 F (37.6 C), temperature source Rectal, resp. rate 18, SpO2 96.00%. Physical Exam  Constitutional: She is oriented to person, place, and time.  HENT:  Head: Normocephalic and atraumatic.  Nose: Nose normal.  Mouth/Throat: No oropharyngeal exudate.  Eyes: Conjunctivae normal are normal. Pupils are equal, round, and reactive to light. Left eye exhibits no discharge. No scleral icterus.  Neck: Neck supple. No JVD present. No tracheal deviation present. No thyromegaly present.  Cardiovascular: Normal rate and regular rhythm.  Exam reveals no friction rub.   Murmur (Soft systolic murmur noted no S3 gallop) heard. Respiratory: Effort normal and breath sounds normal. No respiratory distress. She has no wheezes. She has no rales.  GI: Soft. Bowel sounds are normal. She exhibits no distension. There is no tenderness.  Musculoskeletal: She exhibits no edema and no tenderness.  Neurological: She is alert and oriented to person, place, and time.     Assessment/Plan Probable status post TIA rule out CVA Uncontrolled diabetes mellitus Urinary tract infection Coronary artery disease stable history of PCI to LAD in the  past Hypertension, Hypercholesteremia Obstructive sleep apnea/obesity hypoventilation syndrome Degenerative joint disease History of scoliosis Chronic anemia Chronic kidney disease stage II Morbid obesity Plan As per orders Neurology consult Clent Demark 02/07/2012, 7:32 PM

## 2012-02-08 ENCOUNTER — Inpatient Hospital Stay (HOSPITAL_COMMUNITY): Payer: No Typology Code available for payment source

## 2012-02-08 DIAGNOSIS — R5381 Other malaise: Secondary | ICD-10-CM

## 2012-02-08 DIAGNOSIS — E1100 Type 2 diabetes mellitus with hyperosmolarity without nonketotic hyperglycemic-hyperosmolar coma (NKHHC): Secondary | ICD-10-CM

## 2012-02-08 DIAGNOSIS — R4182 Altered mental status, unspecified: Secondary | ICD-10-CM | POA: Diagnosis present

## 2012-02-08 DIAGNOSIS — G459 Transient cerebral ischemic attack, unspecified: Principal | ICD-10-CM

## 2012-02-08 LAB — HEMOGLOBIN A1C
Hgb A1c MFr Bld: 13.6 % — ABNORMAL HIGH (ref <5.7)
Hgb A1c MFr Bld: 14.7 % — ABNORMAL HIGH (ref <5.7)
Mean Plasma Glucose: 344 mg/dL — ABNORMAL HIGH (ref <117)
Mean Plasma Glucose: 375 mg/dL — ABNORMAL HIGH (ref <117)

## 2012-02-08 LAB — GLUCOSE, CAPILLARY
Glucose-Capillary: 371 mg/dL — ABNORMAL HIGH (ref 70–99)
Glucose-Capillary: 372 mg/dL — ABNORMAL HIGH (ref 70–99)
Glucose-Capillary: 335 mg/dL — ABNORMAL HIGH (ref 70–99)
Glucose-Capillary: 296 mg/dL — ABNORMAL HIGH (ref 70–99)
Glucose-Capillary: 209 mg/dL — ABNORMAL HIGH (ref 70–99)
Glucose-Capillary: 252 mg/dL — ABNORMAL HIGH (ref 70–99)

## 2012-02-08 LAB — URINE CULTURE: Colony Count: 90000

## 2012-02-08 LAB — LIPID PANEL
Cholesterol: 167 mg/dL (ref 0–200)
HDL: 19 mg/dL — ABNORMAL LOW (ref >39)
LDL Cholesterol: UNDETERMINED mg/dL (ref 0–99)
Total CHOL/HDL Ratio: 8.8 RATIO
Triglycerides: 712 mg/dL — ABNORMAL HIGH (ref <150)
VLDL: UNDETERMINED mg/dL (ref 0–40)

## 2012-02-08 MED ORDER — STROKE: EARLY STAGES OF RECOVERY BOOK
Freq: Once | Status: AC
  Administered 2012-02-08: 20:00:00
  Filled 2012-02-08: qty 1

## 2012-02-08 MED ORDER — INSULIN ASPART 100 UNIT/ML ~~LOC~~ SOLN
0.0000 [IU] | SUBCUTANEOUS | Status: DC
  Administered 2012-02-08: 3 [IU] via SUBCUTANEOUS
  Administered 2012-02-08: 5 [IU] via SUBCUTANEOUS
  Administered 2012-02-08: 7 [IU] via SUBCUTANEOUS
  Administered 2012-02-08: 5 [IU] via SUBCUTANEOUS
  Administered 2012-02-08: 9 [IU] via SUBCUTANEOUS
  Administered 2012-02-09: 2 [IU] via SUBCUTANEOUS
  Administered 2012-02-09 (×2): 3 [IU] via SUBCUTANEOUS

## 2012-02-08 NOTE — Progress Notes (Signed)
Subjective:  Patient denies any chest pain shortness of breath or weakness in the arms or legs. Speech still appears slurred. Awaiting swallowing evaluation. Patient remains n.p.o. for now  Objective:  Vital Signs in the last 24 hours: Temp:  [99.6 F (37.6 C)-100.4 F (38 C)] 99.8 F (37.7 C) (01/12 2100) Pulse Rate:  [71-82] 79  (01/13 0600) Resp:  [16-22] 16  (01/13 0600) BP: (115-172)/(70-149) 153/70 mmHg (01/13 0600) SpO2:  [94 %-100 %] 96 % (01/13 0000) Weight:  [110.678 kg (244 lb)-110.768 kg (244 lb 3.2 oz)] 110.678 kg (244 lb) (01/13 0400)  Intake/Output from previous day: 01/12 0701 - 01/13 0700 In: 520 [I.V.:520] Out: -  Intake/Output from this shift:    Physical Exam: Neck: no adenopathy, no carotid bruit, no JVD and supple, symmetrical, trachea midline Lungs: clear to auscultation bilaterally Heart: regular rate and rhythm, S1, S2 normal and Soft systolic murmur noted Abdomen: soft, non-tender; bowel sounds normal; no masses,  no organomegaly Extremities: extremities normal, atraumatic, no cyanosis or edema  Lab Results:  Basename 02/07/12 2006 02/07/12 1319 02/07/12 1252  WBC 8.3 -- 8.2  HGB 13.0 13.3 --  PLT 177 -- 198    Basename 02/07/12 2006 02/07/12 1319 02/07/12 1252  NA -- 140 137  K -- 3.8 3.8  CL -- 103 98  CO2 -- -- 25  GLUCOSE -- 477* 532*  BUN -- 18 17  CREATININE 1.34* 1.40* --    Basename 02/07/12 1252  TROPONINI <0.30   Hepatic Function Panel  Basename 02/07/12 1252  PROT 7.9  ALBUMIN 3.8  AST 15  ALT 11  ALKPHOS 130*  BILITOT 0.6  BILIDIR --  IBILI --   No results found for this basename: CHOL in the last 72 hours No results found for this basename: PROTIME in the last 72 hours  Imaging: Imaging results have been reviewed and Dg Chest 2 View  02/07/2012  *RADIOLOGY REPORT*  Clinical Data: Slurred speech and weakness.  CHEST - 2 VIEW  Comparison: Chest x-ray of 12/01/2010.  Findings: Film is slightly under penetrated.  No  acute consolidative airspace disease.  No definite pleural effusions. Mild crowding of the pulmonary vasculature, accentuated by low lung volumes, without frank pulmonary edema.  Heart size is mildly enlarged.  Mediastinal contours are unremarkable.  S-shaped scoliosis of the thoracolumbar spine convex to the left superiorly and to the right inferiorly.  IMPRESSION: 1.  Low lung volumes without radiographic evidence of acute cardiopulmonary disease. 2.  Mild cardiomegaly.   Original Report Authenticated By: Vinnie Langton, M.D.    Ct Head Wo Contrast  02/07/2012  *RADIOLOGY REPORT*  Clinical Data: Facial droop.  Aphasia.  CT HEAD WITHOUT CONTRAST  Technique:  Contiguous axial images were obtained from the base of the skull through the vertex without contrast.  Comparison: 10/14/2009  Findings: There is patchy low density within the the subcortical and periventricular white matter consistent with chronic small vessel ischemic change.  There is prominence of the sulci and ventricles consistent with brain atrophy.  There is no evidence for acute brain infarct, hemorrhage or mass.  The paranasal sinuses and the mastoid air cells are clear.  The skull is intact.  IMPRESSION: 1. Chronic small vessel ischemic change. 2.  No acute intracranial abnormalities.   Original Report Authenticated By: Kerby Moors, M.D.    Mr Brain Ltd W/o Cm  02/07/2012  *RADIOLOGY REPORT*  Clinical Data: 62 year old female with slurred speech, difficulty swallowing, right arm tremor.  MRI HEAD  WITHOUT CONTRAST  Technique:  Multiplanar, multiecho pulse sequences of the brain and surrounding structures were obtained according to standard protocol without intravenous contrast.  Comparison: Head CT without contrast 1221 hours the same day. Brain MRI 06/05/2005.  Findings: The examination had to be discontinued prior to completion due to the patient's inability to cooperate.  Cerebral volume is mildly decreased since 2007. Major intracranial  vascular flow voids are stable with intracranial dolichoectasia again evident.   Diffusion weighted imaging is mildly heterogeneous.  There is evidence of a punctate area of restricted diffusion in the right lower cerebellum on series 3 image 6 (series 300 image 6).  No other convincing area restricted effusion is identified.  Chronic lacunar infarcts in the left corona radiata extending to the external capsule.  Scattered other chronic cerebral white matter T2 hyperintensity.  Negative pituitary, cervicomedullary junction and visualized cervical spine.  Stable bone marrow signal. Grossly stable scalp soft tissues.  Grossly stable orbits, paranasal sinuses and mastoids.  IMPRESSION: 1.  The only area of possible acute infarct identified is a punctate focus in the right lower cerebellum.  No mass effect can imaged. 2.  Otherwise grossly stable chronic small vessel disease since 2007. 3. The examination had to be discontinued prior to completion .   Original Report Authenticated By: Roselyn Reef, M.D.     Cardiac Studies:  Assessment/Plan:  Probable status post TIA rule out CVA  Uncontrolled diabetes mellitus  Urinary tract infection  Coronary artery disease stable history of PCI to LAD in the past  Hypertension,  Hypercholesteremia  Obstructive sleep apnea/obesity hypoventilation syndrome  Degenerative joint disease  History of scoliosis  Chronic anemia  Chronic kidney disease stage II  Morbid obesity Plan Continue present management  neurology consult Swallowing evaluation OT PT consult  LOS: 1 day    Launa Goedken N 02/08/2012, 8:14 AM

## 2012-02-08 NOTE — Progress Notes (Signed)
Nutrition Brief Note  Patient identified on the Malnutrition Screening Tool (MST) Report.  Body mass index is 43.22 kg/(m^2). Patient meets criteria for Obesity Class III based on current BMI.   Current diet order is Carbohydrate Modified Medium Calorie.  No % meal intake available at this time, however, reports a good appetite.  Labs and medications reviewed.   No nutrition interventions warranted at this time. Please consult RD as needed.   Arthur Holms, RD, LDN Pager #: [phone] After-Hours Pager #: [phone]

## 2012-02-08 NOTE — Progress Notes (Signed)
Portable EEG completed

## 2012-02-08 NOTE — Consult Note (Signed)
Referring Physician: Dr. Terrence Dupont    Chief Complaint: Slurred speech and right-sided weakness  HPI: Michelle Holmes is an 62 y.o. female with a history of hypertension, hypercholesterolemia, diabetes mellitus, coronary disease, TIA and previous stroke, presenting with complaint of weakness and spasm of right extremities as well as slurred speech. Patient woke up with the above symptoms. Patient has been on aspirin and Plavix daily CT scan of her head showed chronic small vessel ischemic changes, but no acute intracranial abnormality. MRI of her brain showed a small punctate right cerebellar density abnormality indicative of possible acute stroke. No acute intracranial abnormality was noted otherwise. She was admitted for evaluation of TIA and possible acute stroke as well as diabetes management and treatment of urinary tract infection. NIH stroke score was 2 for confusion.  LSN: Bedtime on 02/06/2012  tPA Given: No: Beyond time window for treatment consideration  MRankin: 2  Past Medical History  Diagnosis Date  . Emphysema   . Colon cancer   . Hypertension   . Hypercholesteremia   . Diabetes mellitus   . COPD (chronic obstructive pulmonary disease)   . Coronary artery disease   . Obesity   . Sleep apnea   . TIA (transient ischemic attack)   . CHF (congestive heart failure)   . Anemia   . DJD (degenerative joint disease)   . Renal insufficiency   . Chronic back pain   . Scoliosis   . Obesity hypoventilation syndrome   . Stroke     No family history on file.   Medications:  Scheduled:   .  stroke: mapping our early stages of recovery book   Does not apply Once  . aspirin EC  81 mg Oral Daily  . atorvastatin  40 mg Oral q1800  . cefTRIAXone (ROCEPHIN)  IV  1 g Intravenous Q24H  . clopidogrel  75 mg Oral Daily  . ferrous sulfate  325 mg Oral BID WC  . heparin  5,000 Units Subcutaneous Q8H  . insulin aspart  0-9 Units Subcutaneous Q4H  . insulin glargine  30 Units Subcutaneous  BID  . losartan  50 mg Oral Daily  . metoprolol succinate  50 mg Oral Daily  . pantoprazole  40 mg Oral Q0600   [id number]   Physical Examination: Blood pressure 153/70, pulse 79, temperature 99.8 F (37.7 C), temperature source Oral, resp. rate 16, height 5\' 3"  (1.6 m), weight 110.678 kg (244 lb), SpO2 96.00%.  Neurologic Examination: Mental Status: Patient was very confused and tended to perseverate with simple verbal responses.  Able to follow simple commands. Cranial Nerves: II-Visual fields were normal. III/IV/VI-Pupils were equal and reacted. Extraocular movements were full and conjugate.    V/VII-no facial numbness and no facial weakness. VIII-normal. X-no clear dysarthria. Motor: 5/5 bilaterally with normal tone and bulk Sensory: Normal throughout. Deep Tendon Reflexes: Trace only in upper extremities and symmetric; absent in lower extremities. Plantars: Mute bilaterally Cerebellar: Normal finger-to-nose testing. Carotid auscultation: Normal   Dg Chest 2 View  02/07/2012  *RADIOLOGY REPORT*  Clinical Data: Slurred speech and weakness.  CHEST - 2 VIEW  Comparison: Chest x-ray of 12/01/2010.  Findings: Film is slightly under penetrated.  No acute consolidative airspace disease.  No definite pleural effusions. Mild crowding of the pulmonary vasculature, accentuated by low lung volumes, without frank pulmonary edema.  Heart size is mildly enlarged.  Mediastinal contours are unremarkable.  S-shaped scoliosis of the thoracolumbar spine convex to the left superiorly and to the right inferiorly.  IMPRESSION: 1.  Low lung volumes without radiographic evidence of acute cardiopulmonary disease. 2.  Mild cardiomegaly.   Original Report Authenticated By: Vinnie Langton, M.D.    Ct Head Wo Contrast  02/07/2012  *RADIOLOGY REPORT*  Clinical Data: Facial droop.  Aphasia.  CT HEAD WITHOUT CONTRAST  Technique:  Contiguous axial images were obtained from the base of the skull through  the vertex without contrast.  Comparison: 10/14/2009  Findings: There is patchy low density within the the subcortical and periventricular white matter consistent with chronic small vessel ischemic change.  There is prominence of the sulci and ventricles consistent with brain atrophy.  There is no evidence for acute brain infarct, hemorrhage or mass.  The paranasal sinuses and the mastoid air cells are clear.  The skull is intact.  IMPRESSION: 1. Chronic small vessel ischemic change. 2.  No acute intracranial abnormalities.   Original Report Authenticated By: Kerby Moors, M.D.    Mr Brain Ltd W/o Cm  02/07/2012  *RADIOLOGY REPORT*  Clinical Data: 62 year old female with slurred speech, difficulty swallowing, right arm tremor.  MRI HEAD WITHOUT CONTRAST  Technique:  Multiplanar, multiecho pulse sequences of the brain and surrounding structures were obtained according to standard protocol without intravenous contrast.  Comparison: Head CT without contrast 1221 hours the same day. Brain MRI 06/05/2005.  Findings: The examination had to be discontinued prior to completion due to the patient's inability to cooperate.  Cerebral volume is mildly decreased since 2007. Major intracranial vascular flow voids are stable with intracranial dolichoectasia again evident.   Diffusion weighted imaging is mildly heterogeneous.  There is evidence of a punctate area of restricted diffusion in the right lower cerebellum on series 3 image 6 (series 300 image 6).  No other convincing area restricted effusion is identified.  Chronic lacunar infarcts in the left corona radiata extending to the external capsule.  Scattered other chronic cerebral white matter T2 hyperintensity.  Negative pituitary, cervicomedullary junction and visualized cervical spine.  Stable bone marrow signal. Grossly stable scalp soft tissues.  Grossly stable orbits, paranasal sinuses and mastoids.  IMPRESSION: 1.  The only area of possible acute infarct identified  is a punctate focus in the right lower cerebellum.  No mass effect can imaged. 2.  Otherwise grossly stable chronic small vessel disease since 2007. 3. The examination had to be discontinued prior to completion .   Original Report Authenticated By: Roselyn Reef, M.D.     Assessment: 62 y.o. female presenting with possible TIA as etiology for slurred speech and right extremity deficits transiently. Patient has no residual extremity deficits at this point. Significance of small punctate abnormality involving right cerebellum is unclear. She is markedly confused, etiology of which is unclear.  Stroke Risk Factors - diabetes mellitus, hyperlipidemia and hypertension  Plan: 1. HgbA1c, fasting lipid panel 2. MRI, MRA  of the brain without contrast 3. PT consult, OT consult, Speech consult 4. Echocardiogram 5. Carotid dopplers 6. Prophylactic therapy-Antiplatelet med: Aspirin and Plavix  7. EEG  8. Vitamin B12 and folate levels, TSH and RPR.   C.R. [name] Kindred, MD [facility] [phone]  02/08/2012, 10:45 AM

## 2012-02-08 NOTE — Evaluation (Signed)
Clinical/Bedside Swallow Evaluation Patient Details  Name: Michelle Holmes MRN: [id number] Date of Birth: 07/13/1950  Today's Date: 02/08/2012 Time: 1030-1110 SLP Time Calculation (min): 40 min  Past Medical History:  Past Medical History  Diagnosis Date  . Emphysema   . Colon cancer   . Hypertension   . Hypercholesteremia   . Diabetes mellitus   . COPD (chronic obstructive pulmonary disease)   . Coronary artery disease   . Obesity   . Sleep apnea   . TIA (transient ischemic attack)   . CHF (congestive heart failure)   . Anemia   . DJD (degenerative joint disease)   . Renal insufficiency   . Chronic back pain   . Scoliosis   . Obesity hypoventilation syndrome   . Stroke    Past Surgical History:  Past Surgical History  Procedure Date  . Coronary angioplasty with stent placement    HPI:  Patient is 62 year old female with past medical history significant for multiple medical problems i.e. coronary artery disease history of PTCA stenting to LAD in the past, insulin requiring diabetes mellitus, hypertension, obstructive sleep apnea, obesity hypoventilation syndrome, history of TIA in the past, scoliosis, history of congestive heart failure secondary to diastolic dysfunction, degenerative joint disease, chronic anemia, mild renal insufficiency stage II, came to the ER by EMS because of cramping/weakness and right upper and lower extremity since yesterday off and on associated with slurred speech since this morning.  MRI of the brain done in the ER showed questionable very small right lower cerebellum infarct and chronic small vessel disease.   Assessment / Plan / Recommendation Clinical Impression  Pt demonstrates normal oropharyngeal swallow function despite mild right lingual weakness. No s/s of aspiration observed with even large consecutive straw sips. Pt will need to sit fully upright. Pt demonstrated adequate mastication of graham cracker despite missing dentition, reports she  consumes a regular diet without upper dentition. Recommend initating a regular diet with thin liquids, change diet to Dys 3 if pt demonstrates difficulty with mastication. No skilled SLP therapy needed for dysphagia. Will continue to follow for language.     Aspiration Risk  Mild    Diet Recommendation Regular;Thin liquid   Liquid Administration via: Cup;Straw Medication Administration: Whole meds with liquid Supervision: Patient able to self feed Postural Changes and/or Swallow Maneuvers: Seated upright 90 degrees    Other  Recommendations Oral Care Recommendations: Oral care BID   Follow Up Recommendations  Inpatient Rehab (no f/u for dysphagia. needs SLP for language)    Frequency and Duration        Pertinent Vitals/Pain NA    SLP Swallow Goals     Swallow Study Prior Functional Status       General HPI: Patient is 62 year old female with past medical history significant for multiple medical problems i.e. coronary artery disease history of PTCA stenting to LAD in the past, insulin requiring diabetes mellitus, hypertension, obstructive sleep apnea, obesity hypoventilation syndrome, history of TIA in the past, scoliosis, history of congestive heart failure secondary to diastolic dysfunction, degenerative joint disease, chronic anemia, mild renal insufficiency stage II, came to the ER by EMS because of cramping/weakness and right upper and lower extremity since yesterday off and on associated with slurred speech since this morning.  MRI of the brain done in the ER showed questionable very small right lower cerebellum infarct and chronic small vessel disease. Type of Study: Bedside swallow evaluation Diet Prior to this Study: NPO Temperature Spikes Noted: No Respiratory Status:  Room air History of Recent Intubation: No Behavior/Cognition: Alert;Cooperative;Pleasant mood Oral Cavity - Dentition: Missing dentition (edentulous on maxilla) Self-Feeding Abilities: Able to feed  self;Needs set up Patient Positioning: Upright in bed Baseline Vocal Quality: Clear Volitional Cough: Strong Volitional Swallow: Able to elicit    Oral/Motor/Sensory Function Overall Oral Motor/Sensory Function: Impaired Labial ROM: Within Functional Limits Labial Symmetry: Within Functional Limits Labial Strength: Within Functional Limits Labial Sensation: Within Functional Limits Lingual ROM: Reduced left Lingual Symmetry: Abnormal symmetry left Lingual Strength: Reduced Facial ROM: Within Functional Limits Facial Symmetry: Within Functional Limits Facial Strength: Within Functional Limits Facial Sensation: Within Functional Limits Velum: Within Functional Limits Mandible: Within Functional Limits   Ice Chips Ice chips: Not tested   Thin Liquid Thin Liquid: Within functional limits Presentation: Cup;Straw;Self Fed    Nectar Thick Nectar Thick Liquid: Not tested   Honey Thick Honey Thick Liquid: Not tested   Puree Puree: Impaired Presentation: Spoon;Self Fed Oral Phase Functional Implications: Prolonged oral transit   Solid   GO    Solid: Within functional limits      Michelle Baltimore, MA CCC-SLP [phone]  Michelle Holmes, Michelle Holmes 02/08/2012,11:29 AM

## 2012-02-08 NOTE — Evaluation (Signed)
Speech Language Pathology Evaluation Patient Details Name: Michelle Holmes MRN: [id number] DOB: [birth year] Today's Date: 02/08/2012 Time: 1030-1110 SLP Time Calculation (min): 40 min  Problem List:  Patient Active Problem List  Diagnosis  . Cellulitis of pubic region  . Uncontrolled type 2 DM with hyperosmolar nonketotic hyperglycemia  . Sleep apnea  . Morbid obesity  . CHF (congestive heart failure)  . Chronic renal failure  . TIA (transient ischemic attack)  . CAD (coronary artery disease)  . Altered mental status   Past Medical History:  Past Medical History  Diagnosis Date  . Emphysema   . Colon cancer   . Hypertension   . Hypercholesteremia   . Diabetes mellitus   . COPD (chronic obstructive pulmonary disease)   . Coronary artery disease   . Obesity   . Sleep apnea   . TIA (transient ischemic attack)   . CHF (congestive heart failure)   . Anemia   . DJD (degenerative joint disease)   . Renal insufficiency   . Chronic back pain   . Scoliosis   . Obesity hypoventilation syndrome   . Stroke    Past Surgical History:  Past Surgical History  Procedure Date  . Coronary angioplasty with stent placement    HPI:  Patient is 62 year old female with past medical history significant for multiple medical problems i.e. coronary artery disease history of PTCA stenting to LAD in the past, insulin requiring diabetes mellitus, hypertension, obstructive sleep apnea, obesity hypoventilation syndrome, history of TIA in the past, scoliosis, history of congestive heart failure secondary to diastolic dysfunction, degenerative joint disease, chronic anemia, mild renal insufficiency stage II, came to the ER by EMS because of cramping/weakness and right upper and lower extremity since yesterday off and on associated with slurred speech since this morning.  MRI of the brain done in the ER showed questionable very small right lower cerebellum infarct and chronic small vessel disease.    Assessment / Plan / Recommendation Clinical Impression  Pt demonstrated moderate expressive and recpetive aphasia. She demonstreas ability to understand simple phrases and comprehension for more complex language is aided by gestual/visual cues and repetition. Expressive deficits are characterized by anomia, phonemic paraphasia and mild perseveration. Therapeutic interventions including articulatory and phonemic cues aid pt. At phrase level pt lacking content words, quickly improves with cues. She is not always aware of errors. Cognition appears WNL. Pt will benefit from acute SLP therapy to maximize functional communication and increase pt/family education of deficits/strategies. Pt would also benefit from CIR consult.     SLP Assessment  Patient needs continued Speech Lanaguage Pathology Services    Follow Up Recommendations  Inpatient Rehab    Frequency and Duration min 2x/week  2 weeks   Pertinent Vitals/Pain NA   SLP Goals  SLP Goals Potential to Achieve Goals: Good SLP Goal #1: Pt will express basic wants and needs with min verbal cues x3.  SLP Goal #2: Pt will speak at phrase level with moderate phonemic/articulatory cues over 15 minute period.  SLP Goal #3: Pt will utilize compensatory strategies to express want/needs with moderate contextual cues.   SLP Evaluation Prior Functioning  Cognitive/Linguistic Baseline: Information not available Lives With: Alone   Cognition  Overall Cognitive Status: Appears within functional limits for tasks assessed Arousal/Alertness: Awake/alert Orientation Level: Oriented to person;Oriented to place;Oriented to situation (likely oriented to time, language deficits) Attention: Focused;Sustained;Selective Focused Attention: Appears intact Sustained Attention: Appears intact Selective Attention: Appears intact Memory:  (UTA) Awareness: Impaired Awareness Impairment:  Emergent impairment Problem Solving: Appears intact (with basic  tasks) Executive Function: Reasoning;Decision Making;Self Monitoring Reasoning: Appears intact Decision Making: Appears intact Self Monitoring: Appears intact Safety/Judgment: Appears intact    Comprehension  Auditory Comprehension Overall Auditory Comprehension: Impaired Yes/No Questions: Impaired Basic Biographical Questions: 76-100% accurate Complex Questions: 25-49% accurate Commands: Impaired One Step Basic Commands: 75-100% accurate Two Step Basic Commands: 0-24% accurate Conversation: Simple EffectiveTechniques: Visual/Gestural cues;Repetition Visual Recognition/Discrimination Discrimination: Within Function Limits Reading Comprehension Reading Status: Within funtional limits    Expression Expression Primary Mode of Expression: Verbal Verbal Expression Overall Verbal Expression: Impaired Initiation: No impairment Automatic Speech: Name;Social Response;Counting;Day of week (Counting and DOW required visual cues) Level of Generative/Spontaneous Verbalization: Word;Phrase Repetition: Impaired (needs min visual/phonemic cues) Level of Impairment: Word level Naming: Impairment Responsive: Not tested Confrontation: Impaired Convergent: Not tested Divergent: Not tested Verbal Errors: Phonemic paraphasias;Perseveration;Not aware of errors Pragmatics: No impairment Effective Techniques: Articulatory cues;Phonemic cues Non-Verbal Means of Communication: Gestures Written Expression Dominant Hand: Right Written Expression: Not tested   Oral / Motor Oral Motor/Sensory Function Overall Oral Motor/Sensory Function: Impaired Labial ROM: Within Functional Limits Labial Symmetry: Within Functional Limits Labial Strength: Within Functional Limits Labial Sensation: Within Functional Limits Lingual ROM: Reduced left Lingual Symmetry: Abnormal symmetry left Lingual Strength: Reduced Lingual Sensation: Within Functional Limits Facial ROM: Within Functional Limits Facial  Symmetry: Within Functional Limits Facial Strength: Within Functional Limits Facial Sensation: Within Functional Limits Velum: Within Functional Limits Mandible: Within Functional Limits Motor Speech Overall Motor Speech: Impaired Respiration: Within functional limits Phonation: Normal Resonance: Within functional limits Articulation: Impaired Level of Impairment: Word Intelligibility: Intelligibility reduced Word: 75-100% accurate Phrase: 50-74% accurate   GO    Herbie Baltimore, MA CCC-SLP [phone]  Lynann Beaver 02/08/2012, 11:51 AM

## 2012-02-08 NOTE — Progress Notes (Signed)
PT Cancellation Note  Patient Details Name: Michelle Holmes MRN: [id number] DOB: 01-Apr-1950   Cancelled Treatment:    Reason Eval/Treat Not Completed: Fatigue/lethargy limiting ability to participate; pt falling asleep immediately upon arousing. Unable to get her to answer any questions other than her name. EEG then arrived to perform test.   Alba Kriesel 02/08/2012, 2:56 PM Pager [phone]

## 2012-02-08 NOTE — Progress Notes (Signed)
Inpatient Diabetes Program Recommendations  AACE/ADA: New Consensus Statement on Inpatient Glycemic Control (2013)  Target Ranges:  Prepandial:   less than 140 mg/dL      Peak postprandial:   less than 180 mg/dL (1-2 hours)      Critically ill patients:  140 - 180 mg/dL   Reason for Visit: Hyperglycemia Please increase correction to moderate q 4 hrs (Documentation states pt eating 0% ) (Pt may need additional increase to resistant until control is reached.) HgbA1C extremely high indicating need for additional medication therapy as out-patient which may include [place] bid which Would help with weight reduction as well as lowering HgbA1C. Will follow patient and hopefully get information to her PCP for additional med therapy.    Note: Thank you, Rosita Kea, RN, CNS, Diabetes Coordinator [phone])

## 2012-02-09 DIAGNOSIS — G459 Transient cerebral ischemic attack, unspecified: Secondary | ICD-10-CM

## 2012-02-09 LAB — GLUCOSE, CAPILLARY
Glucose-Capillary: 188 mg/dL — ABNORMAL HIGH (ref 70–99)
Glucose-Capillary: 224 mg/dL — ABNORMAL HIGH (ref 70–99)
Glucose-Capillary: 234 mg/dL — ABNORMAL HIGH (ref 70–99)
Glucose-Capillary: 247 mg/dL — ABNORMAL HIGH (ref 70–99)
Glucose-Capillary: 257 mg/dL — ABNORMAL HIGH (ref 70–99)
Glucose-Capillary: 286 mg/dL — ABNORMAL HIGH (ref 70–99)

## 2012-02-09 MED ORDER — INSULIN ASPART 100 UNIT/ML ~~LOC~~ SOLN
0.0000 [IU] | Freq: Three times a day (TID) | SUBCUTANEOUS | Status: DC
  Administered 2012-02-09: 8 [IU] via SUBCUTANEOUS
  Administered 2012-02-09 – 2012-02-10 (×2): 5 [IU] via SUBCUTANEOUS

## 2012-02-09 MED ORDER — INSULIN GLARGINE 100 UNIT/ML ~~LOC~~ SOLN: 35.0000 [IU] | Freq: Two times a day (BID) | SUBCUTANEOUS | Status: DC

## 2012-02-09 MED ORDER — INSULIN GLARGINE 100 UNIT/ML ~~LOC~~ SOLN
35.0000 [IU] | Freq: Two times a day (BID) | SUBCUTANEOUS | Status: DC
  Administered 2012-02-09 – 2012-02-10 (×3): 35 [IU] via SUBCUTANEOUS

## 2012-02-09 MED ORDER — CIPROFLOXACIN HCL 500 MG PO TABS
500.0000 mg | ORAL_TABLET | Freq: Two times a day (BID) | ORAL | Status: DC
  Administered 2012-02-09 – 2012-02-10 (×2): 500 mg via ORAL
  Filled 2012-02-09 (×5): qty 1

## 2012-02-09 NOTE — Procedures (Signed)
EEG NUMBER:  [id number].  REFERRING PHYSICIAN:  Dr. Terrence Dupont.  INDICATION FOR STUDY:  A 62 year old lady with confusion as well as transient slurring of speech, and right side weakness.  Study is being performed to rule out possible partial seizure disorder.  DESCRIPTION:  This is a routine EEG recording performed during wakefulness.  Predominant background activity consisted of 9 hertz symmetrical alpha rhythm.  Overall amplitude of electrical activity diffusely was low for patient of this age.  Photic stimulation was not performed.  Hyperventilation was not performed.  No epileptiform discharges were recorded.  There were no areas of abnormal slowing.  INTERPRETATION:  This EEG is essentially normal.  The amplitude of electrical activity diffusely was lower than expected for the patient of this age.  The study was otherwise unremarkable with no indication of epileptic disorder.     Michelle Char, MD    [id number] D:  02/08/2012 16:46:55  T:  02/09/2012 02:41:48  Job #:  [id number]

## 2012-02-09 NOTE — Progress Notes (Signed)
*  PRELIMINARY RESULTS* Vascular Ultrasound Carotid Duplex (Doppler) has been completed. There is no obvious evidence of hemodynamically significant carotid artery stenosis >40%. Vertebral arteries are patent with antegrade flow.  02/09/2012 12:11 PM Maudry Mayhew, RDMS, RDCS

## 2012-02-09 NOTE — Progress Notes (Signed)
While the patient was waiting to be transported back to her room from the vascular lab, we received an order for an echo. We notified the patient that we were able to complete her echo while she was still in our lab. Upon hearing this the patient became very agitated and refused the echo, stating she was hungry and just wanted to return to her room.  02/09/2012 12:14 PM Maudry Mayhew, RDMS, RDCS  Mauricio Po, RDCS, RCS

## 2012-02-09 NOTE — Progress Notes (Signed)
OT Cancellation Note  Patient Details Name: Michelle Holmes MRN: [id number] DOB: [birth year]   Cancelled Treatment:    Reason Eval/Treat Not Completed: Other (comment) (Pt at a test earlier when OT came by. Pt back in room but pt declines, stating she is fatigued. Encouraged pt to get up at least to the bathroom for OT assess but pt still declining OOB right now.)  Jules Schick [id number] 02/09/2012, 1:35 PM

## 2012-02-09 NOTE — Progress Notes (Signed)
Subjective:  More alert and awake speech markedly improved. Denies any chest pain shortness of breath. Denies any weakness in the right arm or leg  Objective:  Vital Signs in the last 24 hours: Temp:  [97.5 F (36.4 C)-99 F (37.2 C)] 99 F (37.2 C) (01/14 0800) Pulse Rate:  [60-78] 78  (01/14 0800) Resp:  [18-20] 18  (01/14 0800) BP: (136-167)/(81-97) 160/89 mmHg (01/14 0800) SpO2:  [93 %-97 %] 97 % (01/14 0800)  Intake/Output from previous day: 01/13 0701 - 01/14 0700 In: 2297 [P.O.:297; I.V.:1949; IV Piggyback:50] Out: 4 [Urine:2; Stool:2] Intake/Output from this shift:    Physical Exam: Neck: no adenopathy, no carotid bruit, no JVD and supple, symmetrical, trachea midline Lungs: clear to auscultation bilaterally Heart: regular rate and rhythm, S1, S2 normal, no murmur, click, rub or gallop Abdomen: soft, non-tender; bowel sounds normal; no masses,  no organomegaly Extremities: extremities normal, atraumatic, no cyanosis or edema  Lab Results:  Basename 02/07/12 2006 02/07/12 1319 02/07/12 1252  WBC 8.3 -- 8.2  HGB 13.0 13.3 --  PLT 177 -- 198    Basename 02/07/12 2006 02/07/12 1319 02/07/12 1252  NA -- 140 137  K -- 3.8 3.8  CL -- 103 98  CO2 -- -- 25  GLUCOSE -- 477* 532*  BUN -- 18 17  CREATININE 1.34* 1.40* --    Basename 02/07/12 1252  TROPONINI <0.30   Hepatic Function Panel  Basename 02/07/12 1252  PROT 7.9  ALBUMIN 3.8  AST 15  ALT 11  ALKPHOS 130*  BILITOT 0.6  BILIDIR --  IBILI --    Basename 02/08/12 0800  CHOL 167   No results found for this basename: PROTIME in the last 72 hours  Imaging: Imaging results have been reviewed and Dg Chest 2 View  02/07/2012  *RADIOLOGY REPORT*  Clinical Data: Slurred speech and weakness.  CHEST - 2 VIEW  Comparison: Chest x-ray of 12/01/2010.  Findings: Film is slightly under penetrated.  No acute consolidative airspace disease.  No definite pleural effusions. Mild crowding of the pulmonary  vasculature, accentuated by low lung volumes, without frank pulmonary edema.  Heart size is mildly enlarged.  Mediastinal contours are unremarkable.  S-shaped scoliosis of the thoracolumbar spine convex to the left superiorly and to the right inferiorly.  IMPRESSION: 1.  Low lung volumes without radiographic evidence of acute cardiopulmonary disease. 2.  Mild cardiomegaly.   Original Report Authenticated By: Vinnie Langton, M.D.    Ct Head Wo Contrast  02/07/2012  *RADIOLOGY REPORT*  Clinical Data: Facial droop.  Aphasia.  CT HEAD WITHOUT CONTRAST  Technique:  Contiguous axial images were obtained from the base of the skull through the vertex without contrast.  Comparison: 10/14/2009  Findings: There is patchy low density within the the subcortical and periventricular white matter consistent with chronic small vessel ischemic change.  There is prominence of the sulci and ventricles consistent with brain atrophy.  There is no evidence for acute brain infarct, hemorrhage or mass.  The paranasal sinuses and the mastoid air cells are clear.  The skull is intact.  IMPRESSION: 1. Chronic small vessel ischemic change. 2.  No acute intracranial abnormalities.   Original Report Authenticated By: Kerby Moors, M.D.    Mr Brain Ltd W/o Cm  02/07/2012  *RADIOLOGY REPORT*  Clinical Data: 62 year old female with slurred speech, difficulty swallowing, right arm tremor.  MRI HEAD WITHOUT CONTRAST  Technique:  Multiplanar, multiecho pulse sequences of the brain and surrounding structures were obtained according to  standard protocol without intravenous contrast.  Comparison: Head CT without contrast 1221 hours the same day. Brain MRI 06/05/2005.  Findings: The examination had to be discontinued prior to completion due to the patient's inability to cooperate.  Cerebral volume is mildly decreased since 2007. Major intracranial vascular flow voids are stable with intracranial dolichoectasia again evident.   Diffusion weighted  imaging is mildly heterogeneous.  There is evidence of a punctate area of restricted diffusion in the right lower cerebellum on series 3 image 6 (series 300 image 6).  No other convincing area restricted effusion is identified.  Chronic lacunar infarcts in the left corona radiata extending to the external capsule.  Scattered other chronic cerebral white matter T2 hyperintensity.  Negative pituitary, cervicomedullary junction and visualized cervical spine.  Stable bone marrow signal. Grossly stable scalp soft tissues.  Grossly stable orbits, paranasal sinuses and mastoids.  IMPRESSION: 1.  The only area of possible acute infarct identified is a punctate focus in the right lower cerebellum.  No mass effect can imaged. 2.  Otherwise grossly stable chronic small vessel disease since 2007. 3. The examination had to be discontinued prior to completion .   Original Report Authenticated By: Roselyn Reef, M.D.     Cardiac Studies:  Assessment/Plan:  Probable status post TIA rule out CVA  Uncontrolled diabetes mellitus  Urinary tract infection  Coronary artery disease stable history of PCI to LAD in the past  Hypertension,  Hypercholesteremia  Obstructive sleep apnea/obesity hypoventilation syndrome  Degenerative joint disease  History of scoliosis  Chronic anemia  Chronic kidney disease stage II  Morbid obesity  Plan Increase Lantus insulin as per orders Increase ambulation Possible discharge tomorrow if okay with neurology  Dietitian consult  LOS: 2 days    Mikyle Sox N 02/09/2012, 10:46 AM

## 2012-02-09 NOTE — Plan of Care (Signed)
Problem: Limited Adherence to Nutrition-Related Recommendations (NB-1.6) Goal: Nutrition education Formal process to instruct or train a patient/client in a skill or to impart knowledge to help patients/clients voluntarily manage or modify food choices and eating behavior to maintain or improve health.  Outcome: Completed/Met Date Met:  02/09/12  RD consulted for nutrition education regarding a heart healthy diet & diabetes.  Lipid Panel     Component Value Date/Time    CHOL 167 02/08/2012 0800    TRIG 712* 02/08/2012 0800    HDL 19* 02/08/2012 0800    CHOLHDL 8.8 02/08/2012 0800    VLDL UNABLE TO CALCULATE IF TRIGLYCERIDE OVER 400 mg/dL 02/08/2012 0800    LDLCALC UNABLE TO CALCULATE IF TRIGLYCERIDE OVER 400 mg/dL 02/08/2012 0800      Lab Results  Component Value Date    HGBA1C 14.7* 02/08/2012    RD provided "Heart Healthy Nutrition Therapy" & "Carbohydrate Counting for People with Diabetes" handouts from the Academy of Nutrition and Dietetics.  Reviewed patient's dietary recall.  Provided examples on ways to decrease sodium and fat intake in diet; encouraged to continue baking and boiling meats.  Discouraged intake of processed foods and use of salt shaker.  Encouraged fresh fruits and vegetables as well as whole grain sources of carbohydrates to maximize fiber intake.  Teach back method used.  Expect fair compliance.  Body mass index is 43.22 kg/(m^2). Pt meets criteria for Obesity Class III based on current BMI.  Please re-consult RD as needed.  Arthur Holms, RD, LDN Pager #: [phone] After-Hours Pager #: [phone]

## 2012-02-09 NOTE — Progress Notes (Signed)
Utilization review completed.  

## 2012-02-09 NOTE — Progress Notes (Signed)
Stroke Team Progress Note  HISTORY Michelle Holmes is an 62 y.o. female with a history of hypertension, hypercholesterolemia, diabetes mellitus, coronary disease, TIA and previous stroke, presenting with complaint of weakness and spasm of right extremities as well as slurred speech. Patient woke up 02/07/2012 with the above symptoms. Patient has been on aspirin and Plavix daily CT scan of her head showed chronic small vessel ischemic changes, but no acute intracranial abnormality. MRI of her brain showed a small punctate right cerebellar density abnormality indicative of possible acute stroke. No acute intracranial abnormality was noted otherwise. She was admitted for evaluation of TIA and possible acute stroke as well as diabetes management and treatment of urinary tract infection. NIH stroke score was 2 for confusion. Patient was not a TPA candidate secondary to delay in arrival. She was admitted for further evaluation and treatment.  SUBJECTIVE Her family member is at the bedside.  Overall she feels her condition is stable.   OBJECTIVE Most recent Vital Signs: Filed Vitals:   02/08/12 2000 02/09/12 0000 02/09/12 0400 02/09/12 0800  BP: 167/90 147/81 136/90 160/89  Pulse: 67 60 77 78  Temp: 98.4 F (36.9 C) 98.4 F (36.9 C) 98.4 F (36.9 C) 99 F (37.2 C)  TempSrc: Oral Oral Oral   Resp: 20 20 20 18   Height:      Weight:      SpO2: 93% 97% 95% 97%   CBG (last 3)   Basename 02/09/12 0729 02/09/12 0356 02/09/12 0039  GLUCAP 188* 234* 224*   IV Fluid Intake:     . sodium chloride 75 mL/hr at 02/09/12 0421   MEDICATIONS    . aspirin EC  81 mg Oral Daily  . atorvastatin  40 mg Oral q1800  . ciprofloxacin  500 mg Oral BID  . clopidogrel  75 mg Oral Daily  . ferrous sulfate  325 mg Oral BID WC  . heparin  5,000 Units Subcutaneous Q8H  . insulin aspart  0-9 Units Subcutaneous Q4H  . insulin glargine  35 Units Subcutaneous BID  . losartan  50 mg Oral Daily  . metoprolol succinate  50  mg Oral Daily  . pantoprazole  40 mg Oral Q0600   PRN:  senna-docusate  Diet:  Carb Control thin liquids Activity:  DVT Prophylaxis:  Heparin 5000 units sq tid  CLINICALLY SIGNIFICANT STUDIES Basic Metabolic Panel:   Lab [id number] 2006 02/07/12 1319 02/07/12 1252  NA -- 140 137  K -- 3.8 3.8  CL -- 103 98  CO2 -- -- 25  GLUCOSE -- 477* 532*  BUN -- 18 17  CREATININE 1.34* 1.40* --  CALCIUM -- -- 9.7  MG -- -- --  PHOS -- -- --   Liver Function Tests:   Lab 02/07/12 1252  AST 15  ALT 11  ALKPHOS 130*  BILITOT 0.6  PROT 7.9  ALBUMIN 3.8   CBC:   Lab 02/07/12 2006 02/07/12 1319 02/07/12 1252  WBC 8.3 -- 8.2  NEUTROABS -- -- 4.9  HGB 13.0 13.3 --  HCT 37.1 39.0 --  MCV 86.5 -- 86.9  PLT 177 -- 198   Coagulation:   Lab 02/07/12 1252  LABPROT 14.0  INR 1.09   Cardiac Enzymes:   Lab 02/07/12 1252  CKTOTAL --  CKMB --  CKMBINDEX --  TROPONINI <0.30   Urinalysis:   Lab 02/07/12 1149  COLORURINE YELLOW  LABSPEC 1.034*  PHURINE 5.5  GLUCOSEU >1000*  HGBUR MODERATE*  BILIRUBINUR NEGATIVE  KETONESUR  NEGATIVE  PROTEINUR NEGATIVE  UROBILINOGEN 0.2  NITRITE NEGATIVE  LEUKOCYTESUR MODERATE*   Lipid Panel    Component Value Date/Time   CHOL 167 02/08/2012 0800   TRIG 712* 02/08/2012 0800   HDL 19* 02/08/2012 0800   CHOLHDL 8.8 02/08/2012 0800   VLDL UNABLE TO CALCULATE IF TRIGLYCERIDE OVER 400 mg/dL 02/08/2012 0800   LDLCALC UNABLE TO CALCULATE IF TRIGLYCERIDE OVER 400 mg/dL 02/08/2012 0800   HgbA1C  Lab Results  Component Value Date   HGBA1C 14.7* 02/08/2012    Urine Drug Screen:     Component Value Date/Time   LABOPIA NONE DETECTED 02/07/2012 1151   COCAINSCRNUR NONE DETECTED 02/07/2012 1151   LABBENZ NONE DETECTED 02/07/2012 1151   AMPHETMU NONE DETECTED 02/07/2012 1151   THCU NONE DETECTED 02/07/2012 1151   LABBARB NONE DETECTED 02/07/2012 1151    Alcohol Level:   Lab 02/07/12 1252  ETH <11   CT of the brain  02/07/2012  1. Chronic small vessel  ischemic change. 2.  No acute intracranial abnormalities.   Marland Kitchen    MRI of the brain  02/07/2012  1.  The only area of possible acute infarct identified is a punctate focus in the right lower cerebellum.  No mass effect can imaged. 2.  Otherwise grossly stable chronic small vessel disease since 2007. 3. The examination had to be discontinued prior to completion .   MRA of the brain  (not ordered)  2D Echocardiogram  (not ordered)  Carotid Doppler    CXR  02/07/2012  1.  Low lung volumes without radiographic evidence of acute cardiopulmonary disease. 2.  Mild cardiomegaly.     EEG This EEG is essentially normal. The amplitude of electrical activity diffusely was lower than expected for the patient of this age.  EKG     Therapy Recommendations home health PT  Physical Exam   obese african american lady not in distress.Awake alert. Afebrile. Head is nontraumatic. Neck is supple without bruit. Hearing is normal. Cardiac exam no murmur or gallop. Lungs are clear to auscultation. Distal pulses are well felt.  Neurological Exam ; Awake  Alert oriented x 3. Normal speech and language.eye movements full without nystagmus.fundi were not visualized. Vision acuity and fields appear normal.. Face symmetric. Tongue midline. Normal strength, tone, reflexes and coordination. Normal sensation. Gait deferred.  ASSESSMENT [name] is a 62 y.o. female presenting with right sided weakness and spasm as well as slurred speech. Imaging confirms a possible tiny punctate infarct in right lower cerebellum, silent and not cause of current symptoms. ? Seizure, EEG negative. 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  Work up underway. On aspirin 81 mg orally every day and clopidogrel 75 mg orally every day prior to admission. Now on aspirin 81 mg orally every day and clopidogrel 75 mg orally every day for secondary stroke prevention. Patient with resultant right sided spasm  that has resolved.  Hypertension Hyperlipidemia, LDL too high to calculate, on statin PTA, on lipitor now, goal LDL < 100 Diabetes, HgbA1c 14.7 COPD CAD Morbid obesity, Body mass index is 43.22 kg/(m^2). OSA Hx TIA, stroke CHF Renal insufficiency  Hospital day # 2  TREATMENT/PLAN  Continue aspirin 81 mg orally every day and clopidogrel 75 mg orally every day for secondary stroke prevention.  OOB. Therapy evals  Increase correction insulin to moderate scale q 4 hrs per Diabetes Coordinator  check 2D echo  F/u carotid doppler  OOB. Therapy evals  Burnetta Sabin, MSN, RN, ANVP-BC, ANP-BC, [facility] Pager: [phone] 02/09/2012 10:59 AM  I have personally obtained a history, examined the patient, evaluated imaging results, and formulated the assessment and plan of care. I agree with the above.   Antony Contras, MD Medical Director [facility] Pager: [phone] 02/09/2012 1:31 PM

## 2012-02-09 NOTE — Progress Notes (Signed)
  Echocardiogram 2D Echocardiogram has been performed.  Diamond Nickel 02/09/2012, 3:24 PM

## 2012-02-09 NOTE — Evaluation (Signed)
Physical Therapy Evaluation Patient Details Name: Michelle Holmes MRN: [id number] DOB: [birth year] Today's Date: 02/09/2012 Time: 1010-1046 PT Time Calculation (min): 36 min  PT Assessment / Plan / Recommendation Clinical Impression  PT. is a 62 y.o. female admitted with symptoms of CVA/TIA which are now resolving per her report.  Demonstrates supevision to min assist level mobility and at this time will benefit from skilled PT to maximize independence, decrease fall risk and allow d/c home with family assist and HHPT.    PT Assessment  Patient needs continued PT services    Follow Up Recommendations  Home health PT;Supervision/Assistance - 24 hour                Equipment Recommendations  None recommended by PT         Frequency Min 4X/week    Precautions / Restrictions Precautions Precautions: Fall   Pertinent Vitals/Pain Denies pain      Mobility  Bed Mobility Bed Mobility: Supine to Sit;Sit to Supine Supine to Sit: 5: Supervision;With rails;HOB elevated Sit to Supine: 5: Supervision;HOB elevated Transfers Transfers: Sit to Stand;Stand to Sit Sit to Stand: 4: Min guard;5: Supervision;From bed;From chair/3-in-1;With armrests Stand to Sit: 5: Supervision;To bed;To chair/3-in-1;With armrests Details for Transfer Assistance: initial minguard for safety, then supervision from low chair with armrests Ambulation/Gait Ambulation/Gait Assistance: 4: Min guard;4: Min assist Ambulation Distance (Feet): 200 Feet Assistive device: Straight cane Ambulation/Gait Assistance Details: increased lateral sway, decreased step length, increased base of support, one loss of balance to right with mod assist to recover Gait Pattern: Step-through pattern;Decreased stride length;Wide base of support              PT Diagnosis: Abnormality of gait  PT Problem List: Decreased balance;Decreased safety awareness PT Treatment Interventions: DME instruction;Gait training;Functional mobility  training;Patient/family education;Balance training;Therapeutic exercise   PT Goals Acute Rehab PT Goals PT Goal Formulation: With patient Time For Goal Achievement: 02/16/12 Potential to Achieve Goals: Good Pt will go Supine/Side to Sit: with modified independence PT Goal: Supine/Side to Sit - Progress: Goal set today Pt will go Sit to Supine/Side: with modified independence PT Goal: Sit to Supine/Side - Progress: Goal set today Pt will go Sit to Stand: with modified independence PT Goal: Sit to Stand - Progress: Goal set today Pt will go Stand to Sit: with modified independence PT Goal: Stand to Sit - Progress: Goal set today Pt will Stand: Independently;3 - 5 min;with no upper extremity support (during functional activity) PT Goal: Stand - Progress: Goal set today Pt will Ambulate: >150 feet;with least restrictive assistive device;with modified independence PT Goal: Ambulate - Progress: Goal set today  Visit Information  Last PT Received On: 02/09/12 Assistance Needed: +1    Subjective Data  Subjective: I want to get out of here to go to my grandson's birthday party tomorrow. Patient Stated Goal: To go home   Prior [place] Lives With: Family Available Help at Discharge: Family;Available 24 hours/day;Personal care attendant (aide 7d/wk, 2 hr/day; different family with 24hr coverage) Type of Home: House Home Access: Level entry Home Layout: One level Bathroom Shower/Tub: Tub/shower unit Home Adaptive Equipment: Shower chair with back;Straight cane;Bedside commode/3-in-1;Walker - four wheeled;Other (comment) Patent attorney) Prior Function Level of Independence: Needs assistance Needs Assistance: Bathing;Light Housekeeping;Meal Prep;Dressing Bath: Maximal Dressing: Minimal Meal Prep: Total Light Housekeeping: Total Communication Communication: No difficulties    Cognition  Overall Cognitive Status: Appears within functional limits for tasks  assessed/performed Arousal/Alertness: Awake/alert Orientation Level: Appears intact  for tasks assessed Behavior During Session: [facility] for tasks performed    Extremity/Trunk Assessment Right Lower Extremity Assessment RLE ROM/Strength/Tone: Within functional levels RLE Sensation: WFL - Light Touch Left Lower Extremity Assessment LLE ROM/Strength/Tone: Within functional levels LLE Sensation: WFL - Light Touch Trunk Assessment Trunk Assessment: Kyphotic   Balance Standardized Balance Assessment Standardized Balance Assessment: Berg Balance Test Berg Balance Test Sit to Stand: Able to stand  independently using hands Standing Unsupported: Able to stand safely 2 minutes Sitting with Back Unsupported but Feet Supported on Floor or Stool: Able to sit safely and securely 2 minutes Stand to Sit: Controls descent by using hands Transfers: Able to transfer safely, definite need of hands Standing Unsupported with Eyes Closed: Able to stand 10 seconds with supervision Standing Ubsupported with Feet Together: Needs help to attain position but able to stand for 30 seconds with feet together From Standing Position, Turn to Look Behind Over each Shoulder: Turn sideways only but maintains balance Turn 360 Degrees: Able to turn 360 degrees safely but slowly  End of Session PT - End of Session Equipment Utilized During Treatment: Gait belt Patient left: in bed;with family/visitor present  GP     [facility] 02/09/2012, 10:56 AM  Magda Kiel, [place] 02/09/2012

## 2012-02-10 LAB — GLUCOSE, CAPILLARY
Glucose-Capillary: 227 mg/dL — ABNORMAL HIGH (ref 70–99)
Glucose-Capillary: 225 mg/dL — ABNORMAL HIGH (ref 70–99)

## 2012-02-10 MED ORDER — CIPROFLOXACIN HCL 500 MG PO TABS: 500.0000 mg | ORAL_TABLET | Freq: Two times a day (BID) | ORAL | Status: DC

## 2012-02-10 MED ORDER — INSULIN GLARGINE 100 UNIT/ML ~~LOC~~ SOLN: 45.0000 [IU] | Freq: Two times a day (BID) | SUBCUTANEOUS | Status: DC

## 2012-02-10 NOTE — Progress Notes (Signed)
Speech Language Pathology Discharge Patient Details Name: Michelle Holmes MRN: [id number] DOB: 1950/04/04 Today's Date: 02/10/2012 Time: [id number] SLP Time Calculation (min): 16 min  Patient discharged from SLP services secondary to goals met and no further SLP needs identified.  Please see latest therapy progress note for current level of functioning and progress toward goals.    Progress and discharge plan discussed with patient and/or caregiver: Patient/Caregiver agrees with plan  GO     [name] 02/10/2012, 9:49 AM

## 2012-02-10 NOTE — Care Management Note (Signed)
    Page 1 of 1   02/10/2012     2:47:33 PM   CARE MANAGEMENT NOTE 02/10/2012  Patient:  Michelle Holmes, Michelle Holmes   Account Number:  [id number]  Date Initiated:  02/10/2012  Documentation initiated by:  GRAVES-BIGELOW,Tullio Chausse  Subjective/Objective Assessment:   Pt admitted with stroke.     Action/Plan:   Plan for home today. CM offerred choice as she was leaving and she chose [facility] for services. SOC to begin within 24-48 hours post d/c.   Anticipated DC Date:  02/10/2012   Anticipated DC Plan:  [place]  CM consult      Choice offered to / List presented to:  C-1 Patient        [place] arranged  [place] [facility]      [place].   Status of service:  Completed, signed off Medicare Important Message given?   (If response is "NO", the following Medicare IM given date fields will be blank) Date Medicare IM given:   Date Additional Medicare IM given:    Discharge Disposition:  [place]  Per UR Regulation:  Reviewed for med. necessity/level of care/duration of stay  If discussed at [place] of Stay Meetings, dates discussed:    Comments:

## 2012-02-10 NOTE — Progress Notes (Signed)
Speech Language Pathology Treatment Patient Details Name: Michelle Holmes MRN: [id number] DOB: Dec 21, 1950 Today's Date: 02/10/2012 Time: [id number] SLP Time Calculation (min): 16 min  Assessment / Plan / Recommendation Clinical Impression  Pt. seen for aphasia treatment after initial assessment 1/13.  She has made remarkable improvements since Monday.  Pt.'s current language is fluent without paraphasias, anomia or perseveration.  She was able to name items in room with 100% accuracy, engage in conversation without difficulty.  Pt. reports her speech/language has returned to baseline.  No follow up speech therapy is needed at this time and pt. in agreement.    SLP Plan  All goals met       SLP Goals  SLP Goals Progress/Goals/Alternative treatment plan discussed with pt/caregiver and they: Agree SLP Goal #1: Pt will express basic wants and needs with min verbal cues x3.  SLP Goal #1 - Progress: Met SLP Goal #2: Pt will speak at phrase level with moderate phonemic/articulatory cues over 15 minute period.  SLP Goal #2 - Progress: Met SLP Goal #3: Pt will utilize compensatory strategies to express want/needs with moderate contextual cues.  SLP Goal #3 - Progress: Met  General Temperature Spikes Noted: No Respiratory Status: Room air Behavior/Cognition: Alert;Cooperative;Pleasant mood Oral Cavity - Dentition: Missing dentition Patient Positioning: Upright in bed  Oral Cavity - Oral Hygiene Brush patient's teeth BID with toothbrush (using toothpaste with fluoride): Yes   Treatment Treatment focused on: Aphasia Skilled Treatment:  modified independent cues,see impression statement        [name] M.Ed [employer] [phone]  02/10/2012

## 2012-02-10 NOTE — Discharge Summary (Signed)
  Discharge summary dictated on 02/10/2012 dictation number is [phone]

## 2012-02-10 NOTE — Progress Notes (Signed)
Stroke Team Progress Note  HISTORY Michelle Holmes is an 62 y.o. female with a history of hypertension, hypercholesterolemia, diabetes mellitus, coronary disease, TIA and previous stroke, presenting with complaint of weakness and spasm of right extremities as well as slurred speech. Patient woke up 02/07/2012 with the above symptoms. Patient has been on aspirin and Plavix daily CT scan of her head showed chronic small vessel ischemic changes, but no acute intracranial abnormality. MRI of her brain showed a small punctate right cerebellar density abnormality indicative of possible acute stroke. No acute intracranial abnormality was noted otherwise. She was admitted for evaluation of TIA and possible acute stroke as well as diabetes management and treatment of urinary tract infection. NIH stroke score was 2 for confusion. Patient was not a TPA candidate secondary to delay in arrival. She was admitted for further evaluation and treatment.  SUBJECTIVE Her family member is at the bedside.  Overall she feels her condition is stable.   OBJECTIVE Most recent Vital Signs: Filed Vitals:   02/09/12 2000 02/10/12 0000 02/10/12 0400 02/10/12 0800  BP: 147/85 147/84 161/82 158/84  Pulse: 72 67 69 70  Temp: 98.2 F (36.8 C) 98.4 F (36.9 C) 98 F (36.7 C) 97.8 F (36.6 C)  TempSrc: Oral Oral Oral Oral  Resp: 18 20 20    Height:      Weight:      SpO2: 96% 96% 97% 96%   CBG (last 3)   Basename 02/10/12 0735 02/09/12 2123 02/09/12 1627  GLUCAP 227* 286* 247*   IV Fluid Intake:      . sodium chloride 75 mL/hr at 02/09/12 1828   MEDICATIONS     . aspirin EC  81 mg Oral Daily  . atorvastatin  40 mg Oral q1800  . ciprofloxacin  500 mg Oral BID  . clopidogrel  75 mg Oral Daily  . ferrous sulfate  325 mg Oral BID WC  . heparin  5,000 Units Subcutaneous Q8H  . insulin aspart  0-15 Units Subcutaneous TID WC  . insulin glargine  35 Units Subcutaneous BID  . losartan  50 mg Oral Daily  . metoprolol  succinate  50 mg Oral Daily  . pantoprazole  40 mg Oral Q0600   PRN:  senna-docusate  Diet:  Carb Control thin liquids Activity:  DVT Prophylaxis:  Heparin 5000 units sq tid  CLINICALLY SIGNIFICANT STUDIES Basic Metabolic Panel:   Lab [id number] 2006 02/07/12 1319 02/07/12 1252  NA -- 140 137  K -- 3.8 3.8  CL -- 103 98  CO2 -- -- 25  GLUCOSE -- 477* 532*  BUN -- 18 17  CREATININE 1.34* 1.40* --  CALCIUM -- -- 9.7  MG -- -- --  PHOS -- -- --   Liver Function Tests:   Lab 02/07/12 1252  AST 15  ALT 11  ALKPHOS 130*  BILITOT 0.6  PROT 7.9  ALBUMIN 3.8   CBC:   Lab 02/07/12 2006 02/07/12 1319 02/07/12 1252  WBC 8.3 -- 8.2  NEUTROABS -- -- 4.9  HGB 13.0 13.3 --  HCT 37.1 39.0 --  MCV 86.5 -- 86.9  PLT 177 -- 198   Coagulation:   Lab 02/07/12 1252  LABPROT 14.0  INR 1.09   Cardiac Enzymes:   Lab 02/07/12 1252  CKTOTAL --  CKMB --  CKMBINDEX --  TROPONINI <0.30   Urinalysis:   Lab 02/07/12 1149  COLORURINE YELLOW  LABSPEC 1.034*  PHURINE 5.5  GLUCOSEU >1000*  HGBUR MODERATE*  BILIRUBINUR  NEGATIVE  KETONESUR NEGATIVE  PROTEINUR NEGATIVE  UROBILINOGEN 0.2  NITRITE NEGATIVE  LEUKOCYTESUR MODERATE*   Lipid Panel    Component Value Date/Time   CHOL 167 02/08/2012 0800   TRIG 712* 02/08/2012 0800   HDL 19* 02/08/2012 0800   CHOLHDL 8.8 02/08/2012 0800   VLDL UNABLE TO CALCULATE IF TRIGLYCERIDE OVER 400 mg/dL 02/08/2012 0800   LDLCALC UNABLE TO CALCULATE IF TRIGLYCERIDE OVER 400 mg/dL 02/08/2012 0800   HgbA1C  Lab Results  Component Value Date   HGBA1C 14.7* 02/08/2012    Urine Drug Screen:     Component Value Date/Time   LABOPIA NONE DETECTED 02/07/2012 1151   COCAINSCRNUR NONE DETECTED 02/07/2012 1151   LABBENZ NONE DETECTED 02/07/2012 1151   AMPHETMU NONE DETECTED 02/07/2012 1151   THCU NONE DETECTED 02/07/2012 1151   LABBARB NONE DETECTED 02/07/2012 1151    Alcohol Level:   Lab 02/07/12 1252  ETH <11   CT of the brain  02/07/2012  1.  Chronic small vessel ischemic change. 2.  No acute intracranial abnormalities.   Marland Kitchen    MRI of the brain  02/07/2012  1.  The only area of possible acute infarct identified is a punctate focus in the right lower cerebellum.  No mass effect can imaged. 2.  Otherwise grossly stable chronic small vessel disease since 2007. 3. The examination had to be discontinued prior to completion .   MRA of the brain  (not ordered)  2D Echocardiogram  EF 55-60% with no source of embolus.   Carotid Doppler  No evidence of hemodynamically significant internal carotid artery stenosis. Vertebral artery flow is antegrade.   CXR  02/07/2012  1.  Low lung volumes without radiographic evidence of acute cardiopulmonary disease. 2.  Mild cardiomegaly.     EEG This EEG is essentially normal. The amplitude of electrical activity diffusely was lower than expected for the patient of this age.  EKG     Therapy Recommendations home health PT  Physical Exam   obese african american lady not in distress.Awake alert. Afebrile. Head is nontraumatic. Neck is supple without bruit. Hearing is normal. Cardiac exam no murmur or gallop. Lungs are clear to auscultation. Distal pulses are well felt.  Neurological Exam ; Awake  Alert oriented x 3. Normal speech and language.eye movements full without nystagmus.fundi were not visualized. Vision acuity and fields appear normal.. Face symmetric. Tongue midline. Normal strength, tone, reflexes and coordination. Normal sensation. Gait deferred.   ASSESSMENT [name] is a 62 y.o. female presenting with right sided weakness and spasm as well as slurred speech. Imaging confirms a possible tiny punctate infarct in right lower cerebellum, silent and not cause of current symptoms. ? Seizure, EEG negative. 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  Work up completed. On aspirin 81 mg orally every day and clopidogrel 75 mg orally every day prior to  admission. Now on aspirin 81 mg orally every day and clopidogrel 75 mg orally every day for secondary stroke prevention. Patient with resultant right sided spasm that has resolved.  Hypertension Hyperlipidemia, LDL too high to calculate, on statin PTA, on lipitor now, goal LDL < 100 Diabetes, HgbA1c 14.7 COPD CAD Morbid obesity, Body mass index is 43.22 kg/(m^2). OSA Hx TIA, stroke CHF Renal insufficiency  Hospital day # 3  TREATMENT/PLAN  Continue aspirin 81 mg orally every day and clopidogrel 75 mg orally every day for secondary stroke prevention.  Home health PT Stroke  Service will sign off. Follow up with Dr. Leonie Man, [facility], in 2 months.  Burnetta Sabin, MSN, RN, ANVP-BC, ANP-BC, [facility] Pager: [phone] 02/10/2012 9:56 AM  I have personally obtained a history, examined the patient, evaluated imaging results, and formulated the assessment and plan of care. I agree with the above.   Antony Contras, MD Medical Director [facility] Pager: [phone] 02/10/2012 9:56 AM

## 2012-02-10 NOTE — Evaluation (Signed)
Occupational Therapy Evaluation Patient Details Name: Michelle Holmes MRN: [id number] DOB: [birth year] Today's Date: 02/10/2012 Time: [id number] OT Time Calculation (min): 31 min  OT Assessment / Plan / Recommendation Clinical Impression  Pt admitted with weakness, spasm in right extremities and slurred speech. MRI of her brain showed a small punctate right cerebellar density abnormality indicative of possible acute stroke. No acute intracranial abnormality was noted otherwise.  Will benefit from acute OT services to address below problem list to ensure safe return home.  Pt somewhat limited at baseline and has 24/7 assist as well as home health aide. Recommend continued 24/7 supervision/assist.    OT Assessment  Patient needs continued OT Services    Follow Up Recommendations  No OT follow up;Supervision/Assistance - 24 hour    Barriers to Discharge None    Equipment Recommendations  None recommended by OT    Recommendations for Other Services    Frequency  Min 2X/week    Precautions / Restrictions Precautions Precautions: Fall   Pertinent Vitals/Pain See vitals    ADL  Eating/Feeding: Performed;Independent Where Assessed - Eating/Feeding: Chair Grooming: Performed;Wash/dry face;Wash/dry hands;Min guard Where Assessed - Grooming: Unsupported standing Upper Body Bathing: Simulated;Set up Where Assessed - Upper Body Bathing: Unsupported sitting Lower Body Bathing: Simulated;Minimal assistance Where Assessed - Lower Body Bathing: Unsupported sit to stand Upper Body Dressing: Simulated;Set up Where Assessed - Upper Body Dressing: Unsupported sitting Lower Body Dressing: Simulated;Minimal assistance Where Assessed - Lower Body Dressing: Unsupported sit to stand Toilet Transfer: Simulated;Min guard Toilet Transfer Method: Sit to Loss adjuster, chartered:  (bed to chair) Equipment Used: Gait belt Transfers/Ambulation Related to ADLs: min guard ambulating ~4 ft to sink using  countertop for RUE support.  No LOB during ambulation or turning gut required slightlyt increased time. VC for safety with lines and leads. ADL Comments: Pt states she typically uses cane when ambulating at home.     OT Diagnosis: Generalized weakness  OT Problem List: Decreased knowledge of use of DME or AE;Impaired balance (sitting and/or standing);Decreased strength OT Treatment Interventions: Self-care/ADL training;DME and/or AE instruction;Therapeutic activities;Patient/family education;Balance training   OT Goals Acute Rehab OT Goals OT Goal Formulation: With patient Time For Goal Achievement: 02/17/12 Potential to Achieve Goals: Good ADL Goals Pt Will Perform Grooming: with modified independence;Standing at sink ADL Goal: Grooming - Progress: Goal set today Pt Will Transfer to Toilet: with modified independence;Ambulation;with DME;Comfort height toilet ADL Goal: Toilet Transfer - Progress: Goal set today Pt Will Perform Toileting - Clothing Manipulation: with modified independence;Sitting on 3-in-1 or toilet;Standing ADL Goal: Toileting - Clothing Manipulation - Progress: Goal set today Pt Will Perform Toileting - Hygiene: with modified independence;Sit to stand from 3-in-1/toilet ADL Goal: Toileting - Hygiene - Progress: Goal set today  Visit Information  Last OT Received On: 02/10/12 Assistance Needed: +1    Subjective Data      Prior Functioning     Home Living Lives With: Family Available Help at Discharge: Family;Available 24 hours/day;Personal care attendant Type of Home: House Home Access: Level entry Home Layout: One level Bathroom Shower/Tub: Tub/shower unit Home Adaptive Equipment: Shower chair with back;Straight cane;Bedside commode/3-in-1;Walker - four wheeled;Other (comment) (electric scooter) Additional Comments: Pt reports she has a nurse come in from 8am-10am and family is with her 24/7 as well. Prior Function Level of Independence: Needs  assistance Needs Assistance: Bathing;Light Housekeeping;Meal Prep;Dressing Bath: Maximal Dressing: Minimal Meal Prep: Total Light Housekeeping: Total Communication Communication: No difficulties Dominant Hand: Right  Vision/Perception     Cognition  Overall Cognitive Status: Appears within functional limits for tasks assessed/performed Arousal/Alertness: Awake/alert Orientation Level: Appears intact for tasks assessed Behavior During Session: [facility] for tasks performed    Extremity/Trunk Assessment Right Upper Extremity Assessment RUE ROM/Strength/Tone: Within functional levels RUE Sensation: WFL - Light Touch;WFL - Proprioception RUE Coordination: WFL - gross/fine motor Left Upper Extremity Assessment LUE ROM/Strength/Tone: Within functional levels LUE Sensation: WFL - Light Touch;WFL - Proprioception LUE Coordination: WFL - gross/fine motor     Mobility Bed Mobility Bed Mobility: Supine to Sit;Sitting - Scoot to Edge of Bed Supine to Sit: 6: Modified independent (Device/Increase time);With rails;HOB elevated (HOB 30 degrees) Sitting - Scoot to Edge of Bed: 6: Modified independent (Device/Increase time) Transfers Transfers: Sit to Stand;Stand to Sit Sit to Stand: 5: Supervision;From bed;With upper extremity assist Stand to Sit: 5: Supervision;To chair/3-in-1;With armrests;With upper extremity assist Details for Transfer Assistance: supervision for safety     Shoulder Instructions     Exercise     Balance     End of Session OT - End of Session Equipment Utilized During Treatment: Gait belt Activity Tolerance: Patient tolerated treatment well Patient left: in chair;with call bell/phone within reach;with nursing in room Nurse Communication: Mobility status  GO    02/10/2012 Darrol Jump OTR/L Pager [phone] Office [phone]  Darrol Jump 02/10/2012, 11:08 AM

## 2012-02-10 NOTE — Progress Notes (Addendum)
Pt d/c home today. Copy of instructions and stroke education provided, as well as pt states she already sent some education materials home,(may have included CHF information pt states). Encouraged pt to weigh herself daily and follow CHF guidelines provided in d/c instructions as when to call MD for weight gain. Pt also encouraged to write her CBGs  down on a calendar for each time of day checked and daily and show to primary MD, so insulin can be adjusted accordingly.  Pt verbalized understanding of all instructions, may need reinforcement, family at bedside and handouts given.    Pt d/c'd via wheelchair with belongings with family and escorted by hospital volunteer.

## 2012-02-11 NOTE — Discharge Summary (Signed)
NAMELAQUISHIA, GIMLIN                  ACCOUNT NO.:  [id number]  MEDICAL RECORD NO.:  [id number]  LOCATION:  [id number]                        FACILITY:  [facility]  PHYSICIAN:  Allegra Lai. Terrence Dupont, M.D. DATE OF BIRTH:  [birth year]  DATE OF ADMISSION:  02/07/2012 DATE OF DISCHARGE:  02/10/2012                              DISCHARGE SUMMARY   ADMITTING DIAGNOSES: 1. Probable status post transient ischemic attack, rule out     cerebrovascular accident. 2. Uncontrolled diabetes mellitus. 3. Urinary tract infection. 4. Coronary artery disease, history of PCI to LAD in the past, stable. 5. Hypertension. 6. Hypercholesteremia. 7. Obstructive sleep apnea/obesity, hypoventilation syndrome. 8. Degenerative joint disease. 9. History of scoliosis/ 10.Chronic anemia. 11.Chronic kidney disease, stage 2. 12.Morbid obesity.  DISCHARGE DIAGNOSES: 1. Probable status post transient ischemic attack, rule out     cerebrovascular accident. 2. Uncontrolled diabetes mellitus. 3. Urinary tract infection. 4. Coronary artery disease, history of PCI to LAD in the past, stable. 5. Hypertension. 6. Hypercholesteremia. 7. Obstructive sleep apnea/obesity hypoventilation syndrome. 8. Degenerative joint disease. 9. History of scoliosis/ 10.Chronic anemia. 11.Chronic kidney disease, stage 2. 12.Morbid obesity.  DISCHARGE MEDICATIONS:  Ciprofloxacin 500 mg 1 tablet twice daily for 5 more days, Lantus insulin 45 units twice daily, amlodipine 5 mg 1 tablet daily, aspirin 81 mg 1 tablet daily, clopidogrel 75 mg 1 tablet daily, ferrous sulfate 325 mg twice daily, losartan 100 mg daily, Toprol-XL 100 mg daily, Protonix 40 mg 1 tablet daily, Crestor 10 mg 1 tablet daily.  DIET:  Low salt, low cholesterol.  ACTIVITY:  Increase activity slowly as tolerated.  The patient has been advised to monitor blood pressure and blood sugar twice daily.  Follow up with me in 1 week.  Follow up with Dr. Antony Contras in [facility]  in 2 months.  CONDITION AT DISCHARGE:  Stable.  BRIEF HISTORY AND HOSPITAL COURSE:  Ms. Kinzee Debarr is a 62 year old female with past medical history significant for multiple medical problems i.e., coronary artery disease status post PTCA stenting to LAD in the past, insulin-requiring diabetes mellitus, hypercholesteremia, hypertension, obstructive sleep apnea/obesity hypoventilation syndrome, history of TIA in the past, scoliosis, history of congestive heart failure secondary to diastolic dysfunction, degenerative joint disease, chronic anemia, mild renal insufficiency stage II.  She came to the ER by EMS because of cramping/weakness in the right upper and lower extremities since yesterday off and on associated with slurred speech since this morning.  The patient denies any chest pain, shortness of breath, nausea, vomiting, diaphoresis.  Denies any palpitation, lightheadedness, or syncope.  Denies any seizure activity.  Denies PND, orthopnea, or leg swelling.  The patient states her blood sugar has been running very high for last 2 days associated with polyuria but denies any dysuria.  Denies any fever or chills.  MRI of the brain done in the ER showed questionable very small right lower cerebellum infarct with chronic small vessel disease.  The patient was also noted to have blood sugar above 500 and urinalysis was suggestive of UTI.  PAST MEDICAL HISTORY:  As above.  PHYSICAL EXAMINATION:  GENERAL:  She was alert, awake, oriented. VITAL SIGNS:  Blood pressure was elevated 172/106,  pulse was 73.  She had low-grade temp of 99.6. HEENT:  Conjunctivae was normal. NECK:  Supple.  No JVD. LUNGS:  Clear to auscultation without rhonchi or rales. CARDIOVASCULAR:  S1, S2 was normal.  There was soft systolic murmur. There was no S3 gallop. ABDOMEN:  Soft.  Bowel sounds were present.  Obese.  Nontender. EXTREMITIES:  There was no clubbing, cyanosis, or edema.  LABORATORY DATA:  Sodium was  137, potassium 3.8, BUN 17, creatinine 1.38, glucose was 532, hemoglobin was 12.7, hematocrit 37.1, white count of 8.2.  Urinalysis showed too numerous WBCs and moderate leukocytes. Urine culture grew multiple bacterial morphotypes.  Chest x-ray showed low lung volumes without radiographic evidence of acute cardiopulmonary disease.  Mild cardiomegaly.  CT of the brain showed chronic small vessel changes.  No acute intracranial abnormalities.  Patient subsequently had MRI of the brain which showed only small area of possible acute infarct identified as punctate focus in the right lower cerebellum.  No mass effect can be identified.  Chronic small vessel disease since 2007.  Patient had 2D echo which showed no evidence of cardiac source of embolism.  Good LV systolic function.  EF of 55-60%. There was a trivial tricuspid regurgitation.  There was some mild grade 1 diastolic dysfunction.  Cholesterol was 167, triglycerides were very high at 712, HDL was low 19.  BRIEF HOSPITAL COURSE:  The patient was admitted to telemetry unit. Neurology consultation was obtained.  The patient was started on IV Rocephin initially which was switched to p.o. Cipro.  Her Lantus insulin dose has been increased.  Her blood sugar is running in 200s.  Patient's slurred speech gradually got resolved.  Her right upper extremity and lower extremity stents remained stable with no evidence of weakness.  OT, PT consultation was obtained.  The patient has been ambulating in room without any problems.  The patient will be discharged home on above medications and will be followed up in my office in 1 week and with Dr. Leonie Man in 2 months.     Allegra Lai. Terrence Dupont, M.D.     MNH/MEDQ  D:  02/10/2012  T:  02/11/2012  Job:  [id number]

## 2013-01-23 ENCOUNTER — Emergency Department (HOSPITAL_COMMUNITY)
Admission: EM | Admit: 2013-01-23 | Discharge: 2013-01-23 | Disposition: A | Discharge status: Home / Self Care | Payer: No Typology Code available for payment source | Attending: Emergency Medicine | Admitting: Emergency Medicine

## 2013-01-23 ENCOUNTER — Encounter (HOSPITAL_COMMUNITY): Payer: Self-pay | Admitting: Emergency Medicine

## 2013-01-23 DIAGNOSIS — J449 Chronic obstructive pulmonary disease, unspecified: Secondary | ICD-10-CM | POA: Diagnosis not present

## 2013-01-23 DIAGNOSIS — Z880 Allergy status to penicillin: Secondary | ICD-10-CM | POA: Diagnosis not present

## 2013-01-23 DIAGNOSIS — Z7902 Long term (current) use of antithrombotics/antiplatelets: Secondary | ICD-10-CM | POA: Insufficient documentation

## 2013-01-23 DIAGNOSIS — Z85038 Personal history of other malignant neoplasm of large intestine: Secondary | ICD-10-CM | POA: Insufficient documentation

## 2013-01-23 DIAGNOSIS — G8929 Other chronic pain: Secondary | ICD-10-CM | POA: Diagnosis not present

## 2013-01-23 DIAGNOSIS — M4120 Other idiopathic scoliosis, site unspecified: Secondary | ICD-10-CM | POA: Diagnosis not present

## 2013-01-23 DIAGNOSIS — E119 Type 2 diabetes mellitus without complications: Secondary | ICD-10-CM | POA: Diagnosis not present

## 2013-01-23 DIAGNOSIS — E662 Morbid (severe) obesity with alveolar hypoventilation: Secondary | ICD-10-CM | POA: Insufficient documentation

## 2013-01-23 DIAGNOSIS — Z9861 Coronary angioplasty status: Secondary | ICD-10-CM | POA: Diagnosis not present

## 2013-01-23 DIAGNOSIS — I10 Essential (primary) hypertension: Secondary | ICD-10-CM | POA: Diagnosis present

## 2013-01-23 DIAGNOSIS — I2510 Atherosclerotic heart disease of native coronary artery without angina pectoris: Secondary | ICD-10-CM | POA: Insufficient documentation

## 2013-01-23 DIAGNOSIS — Z794 Long term (current) use of insulin: Secondary | ICD-10-CM | POA: Insufficient documentation

## 2013-01-23 DIAGNOSIS — Z7982 Long term (current) use of aspirin: Secondary | ICD-10-CM | POA: Diagnosis not present

## 2013-01-23 DIAGNOSIS — E7800 Pure hypercholesterolemia, unspecified: Secondary | ICD-10-CM | POA: Insufficient documentation

## 2013-01-23 DIAGNOSIS — R739 Hyperglycemia, unspecified: Secondary | ICD-10-CM

## 2013-01-23 DIAGNOSIS — Z87891 Personal history of nicotine dependence: Secondary | ICD-10-CM | POA: Diagnosis not present

## 2013-01-23 DIAGNOSIS — Z8669 Personal history of other diseases of the nervous system and sense organs: Secondary | ICD-10-CM | POA: Insufficient documentation

## 2013-01-23 DIAGNOSIS — D649 Anemia, unspecified: Secondary | ICD-10-CM | POA: Diagnosis not present

## 2013-01-23 DIAGNOSIS — M549 Dorsalgia, unspecified: Secondary | ICD-10-CM | POA: Insufficient documentation

## 2013-01-23 DIAGNOSIS — Z8673 Personal history of transient ischemic attack (TIA), and cerebral infarction without residual deficits: Secondary | ICD-10-CM | POA: Insufficient documentation

## 2013-01-23 DIAGNOSIS — I509 Heart failure, unspecified: Secondary | ICD-10-CM | POA: Insufficient documentation

## 2013-01-23 DIAGNOSIS — Z792 Long term (current) use of antibiotics: Secondary | ICD-10-CM | POA: Insufficient documentation

## 2013-01-23 DIAGNOSIS — R42 Dizziness and giddiness: Secondary | ICD-10-CM | POA: Insufficient documentation

## 2013-01-23 DIAGNOSIS — M1990 Unspecified osteoarthritis, unspecified site: Secondary | ICD-10-CM | POA: Insufficient documentation

## 2013-01-23 DIAGNOSIS — J4489 Other specified chronic obstructive pulmonary disease: Secondary | ICD-10-CM | POA: Insufficient documentation

## 2013-01-23 LAB — GLUCOSE, CAPILLARY: Glucose-Capillary: 325 mg/dL — ABNORMAL HIGH (ref 70–99)

## 2013-01-23 MED ORDER — SODIUM CHLORIDE 0.9 % IV BOLUS (SEPSIS): 1000.0000 mL | Freq: Once | INTRAVENOUS | Status: DC

## 2013-01-23 MED ORDER — INSULIN ASPART 100 UNIT/ML ~~LOC~~ SOLN: 8.0000 [IU] | Freq: Once | SUBCUTANEOUS | Status: DC

## 2013-01-23 NOTE — ED Notes (Signed)
Pt states she had a BM and feels great. Pt states she would like to go home. Md notified. Md at bedside

## 2013-01-23 NOTE — ED Notes (Signed)
Patient presents stating that her BP was high when the EMS came to the house.  Wanted to get checked  Also noticed her sugar has been high

## 2013-01-23 NOTE — ED Provider Notes (Signed)
CSN: [id number]     Arrival date & time 01/23/13  0028 History   First MD Initiated Contact with Patient 01/23/13 0250     Chief Complaint  Patient presents with  . Hypertension  . Hyperglycemia   (Consider location/radiation/quality/duration/timing/severity/associated sxs/prior Treatment) HPI History provided by patient. At home tonight and feeling lightheaded and called EMS. She was noted to have elevated blood pressure. She declined transport at that time. Family member encouraged her to be evaluated and brought her to the emergency department. Symptoms resolved prior to arrival. No syncope. No headache. No altered mental status. No emergency department patient is requesting to be discharged home. She feels better. She prefers to go home and take her Lantus as prescribed, drink water and take her other prescribed medications. She denies any chest pain, shortness of breath, abdominal pain, nausea, vomiting or diarrhea. No fevers or chills. No cough or recent illness. No known sick contacts. Patient prefers to see her primary care physician in the morning.  Past Medical History  Diagnosis Date  . Emphysema   . Colon cancer   . Hypertension   . Hypercholesteremia   . Diabetes mellitus   . COPD (chronic obstructive pulmonary disease)   . Coronary artery disease   . Obesity   . Sleep apnea   . TIA (transient ischemic attack)   . CHF (congestive heart failure)   . Anemia   . DJD (degenerative joint disease)   . Renal insufficiency   . Chronic back pain   . Scoliosis   . Obesity hypoventilation syndrome   . Stroke    Past Surgical History  Procedure Laterality Date  . Coronary angioplasty with stent placement     History reviewed. No pertinent family history. History  Substance Use Topics  . Smoking status: Former Research scientist (life sciences)  . Smokeless tobacco: Not on file  . Alcohol Use: No   OB History   Grav Para Term Preterm Abortions TAB SAB Ect Mult Living                 Review of  Systems  Constitutional: Negative for fever and chills.  Eyes: Negative for visual disturbance.  Respiratory: Negative for shortness of breath.   Cardiovascular: Negative for chest pain.  Gastrointestinal: Negative for vomiting and abdominal pain.  Genitourinary: Negative for dysuria.  Musculoskeletal: Negative for back pain and neck pain.  Skin: Negative for rash.  Neurological: Positive for light-headedness. Negative for headaches.  All other systems reviewed and are negative.    Allergies  Penicillins  Home Medications   Current Outpatient Rx  Name  Route  Sig  Dispense  Refill  . EXPIRED: amLODipine (NORVASC) 5 MG tablet   Oral   Take 1 tablet (5 mg total) by mouth daily.   30 tablet   3   . aspirin 81 MG tablet   Oral   Take 81 mg by mouth daily.          . ciprofloxacin (CIPRO) 500 MG tablet   Oral   Take 1 tablet (500 mg total) by mouth 2 (two) times daily.   10 tablet   0   . clopidogrel (PLAVIX) 75 MG tablet   Oral   Take 75 mg by mouth daily.         Marland Kitchen EXPIRED: ferrous sulfate 325 (65 FE) MG tablet   Oral   Take 1 tablet (325 mg total) by mouth 2 (two) times daily with a meal.   60 tablet  3   . EXPIRED: insulin glargine (LANTUS) 100 UNIT/ML injection   Subcutaneous   Inject 42 Units into the skin 2 (two) times daily.         . insulin glargine (LANTUS) 100 UNIT/ML injection   Subcutaneous   Inject 45 Units into the skin 2 (two) times daily.   10 mL   3   . losartan (COZAAR) 100 MG tablet   Oral   Take 100 mg by mouth daily.          . metoprolol succinate (TOPROL-XL) 100 MG 24 hr tablet   Oral   Take 100 mg by mouth daily. Take with or immediately following a meal.         . EXPIRED: pantoprazole (PROTONIX) 40 MG tablet   Oral   Take 1 tablet (40 mg total) by mouth daily at 6 (six) AM.   30 tablet   3   . rosuvastatin (CRESTOR) 10 MG tablet   Oral   Take 10 mg by mouth daily.           BP 161/71  Pulse 51  Temp(Src)  98.6 F (37 C) (Oral)  Resp 20  Ht 5\' 4"  (1.626 m)  Wt 280 lb (127.007 kg)  BMI 48.04 kg/m2  SpO2 94% Physical Exam  Constitutional: She is oriented to person, place, and time. She appears well-developed and well-nourished.  HENT:  Head: Normocephalic and atraumatic.  Mouth/Throat: Oropharynx is clear and moist. No oropharyngeal exudate.  Eyes: EOM are normal. Pupils are equal, round, and reactive to light.  Neck: Neck supple.  Cardiovascular: Normal rate, regular rhythm and intact distal pulses.   Pulmonary/Chest: Effort normal and breath sounds normal. No respiratory distress.  Abdominal: Soft. Bowel sounds are normal. She exhibits no distension. There is no tenderness.  Obese  Musculoskeletal: Normal range of motion. She exhibits no edema and no tenderness.  Neurological: She is alert and oriented to person, place, and time. No cranial nerve deficit. Coordination normal.  Skin: Skin is warm and dry.    ED Course  Procedures (including critical care time) Labs Review Labs Reviewed  GLUCOSE, CAPILLARY - Abnormal; Notable for the following:    Glucose-Capillary 325 (*)    All other components within normal limits  CBC   Imaging Review No results found.  After patient evaluation with labs and treatment with IV fluids. She declines and prefers to be discharged to see her physician in the morning. She is a reliable historian with normal neuro exam, no deficits. No ACS symptoms. She is alert and oriented x 4.  Plan discharge home with close outpatient followup. Encouraged return to the emergency department for any concerning symptoms or worsening condition.  MDM   1. Hypertension   2. Hyperglycemia    Elevated blood sugar and blood pressure. Patient declines any treatment or further evaluation. Discharged home with family. Vital signs and nursing notes reviewed and considered.    Teressa Lower, MD 01/23/13 0300

## 2013-02-15 DIAGNOSIS — M48061 Spinal stenosis, lumbar region without neurogenic claudication: Secondary | ICD-10-CM | POA: Diagnosis not present

## 2013-03-13 DIAGNOSIS — M48061 Spinal stenosis, lumbar region without neurogenic claudication: Secondary | ICD-10-CM | POA: Diagnosis not present

## 2013-03-27 DIAGNOSIS — R0602 Shortness of breath: Secondary | ICD-10-CM | POA: Diagnosis not present

## 2013-03-29 DIAGNOSIS — M48061 Spinal stenosis, lumbar region without neurogenic claudication: Secondary | ICD-10-CM | POA: Diagnosis not present

## 2013-04-04 DIAGNOSIS — E7800 Pure hypercholesterolemia, unspecified: Secondary | ICD-10-CM | POA: Diagnosis not present

## 2013-04-04 DIAGNOSIS — I10 Essential (primary) hypertension: Secondary | ICD-10-CM | POA: Diagnosis not present

## 2013-04-04 DIAGNOSIS — I2510 Atherosclerotic heart disease of native coronary artery without angina pectoris: Secondary | ICD-10-CM | POA: Diagnosis not present

## 2013-04-04 DIAGNOSIS — IMO0001 Reserved for inherently not codable concepts without codable children: Secondary | ICD-10-CM | POA: Diagnosis not present

## 2013-04-06 DIAGNOSIS — M25579 Pain in unspecified ankle and joints of unspecified foot: Secondary | ICD-10-CM | POA: Diagnosis not present

## 2013-04-21 DIAGNOSIS — J029 Acute pharyngitis, unspecified: Secondary | ICD-10-CM | POA: Diagnosis not present

## 2013-04-21 DIAGNOSIS — J40 Bronchitis, not specified as acute or chronic: Secondary | ICD-10-CM | POA: Diagnosis not present

## 2013-04-21 DIAGNOSIS — I10 Essential (primary) hypertension: Secondary | ICD-10-CM | POA: Diagnosis not present

## 2013-04-21 DIAGNOSIS — E7800 Pure hypercholesterolemia, unspecified: Secondary | ICD-10-CM | POA: Diagnosis not present

## 2013-04-21 DIAGNOSIS — E119 Type 2 diabetes mellitus without complications: Secondary | ICD-10-CM | POA: Diagnosis not present

## 2013-04-22 DIAGNOSIS — R1110 Vomiting, unspecified: Secondary | ICD-10-CM | POA: Diagnosis not present

## 2013-04-25 DIAGNOSIS — M545 Low back pain, unspecified: Secondary | ICD-10-CM | POA: Diagnosis not present

## 2013-05-24 DIAGNOSIS — M25569 Pain in unspecified knee: Secondary | ICD-10-CM | POA: Diagnosis not present

## 2013-05-25 DIAGNOSIS — E7800 Pure hypercholesterolemia, unspecified: Secondary | ICD-10-CM | POA: Diagnosis not present

## 2013-05-25 DIAGNOSIS — E119 Type 2 diabetes mellitus without complications: Secondary | ICD-10-CM | POA: Diagnosis not present

## 2013-05-25 DIAGNOSIS — I10 Essential (primary) hypertension: Secondary | ICD-10-CM | POA: Diagnosis not present

## 2013-06-14 DIAGNOSIS — M25549 Pain in joints of unspecified hand: Secondary | ICD-10-CM | POA: Diagnosis not present

## 2013-07-04 DIAGNOSIS — M25549 Pain in joints of unspecified hand: Secondary | ICD-10-CM | POA: Diagnosis not present

## 2013-07-21 DIAGNOSIS — E785 Hyperlipidemia, unspecified: Secondary | ICD-10-CM | POA: Diagnosis not present

## 2013-07-21 DIAGNOSIS — E669 Obesity, unspecified: Secondary | ICD-10-CM | POA: Diagnosis not present

## 2013-07-21 DIAGNOSIS — I252 Old myocardial infarction: Secondary | ICD-10-CM | POA: Diagnosis not present

## 2013-07-21 DIAGNOSIS — E119 Type 2 diabetes mellitus without complications: Secondary | ICD-10-CM | POA: Diagnosis not present

## 2013-07-21 DIAGNOSIS — E1149 Type 2 diabetes mellitus with other diabetic neurological complication: Secondary | ICD-10-CM | POA: Diagnosis not present

## 2013-07-21 DIAGNOSIS — I2510 Atherosclerotic heart disease of native coronary artery without angina pectoris: Secondary | ICD-10-CM | POA: Diagnosis not present

## 2013-07-21 DIAGNOSIS — M159 Polyosteoarthritis, unspecified: Secondary | ICD-10-CM | POA: Diagnosis not present

## 2013-07-21 DIAGNOSIS — I10 Essential (primary) hypertension: Secondary | ICD-10-CM | POA: Diagnosis not present

## 2013-07-26 DIAGNOSIS — M24876 Other specific joint derangements of unspecified foot, not elsewhere classified: Secondary | ICD-10-CM | POA: Diagnosis not present

## 2013-07-26 DIAGNOSIS — M24873 Other specific joint derangements of unspecified ankle, not elsewhere classified: Secondary | ICD-10-CM | POA: Diagnosis not present

## 2013-08-02 DIAGNOSIS — E119 Type 2 diabetes mellitus without complications: Secondary | ICD-10-CM | POA: Diagnosis not present

## 2013-08-02 DIAGNOSIS — E1149 Type 2 diabetes mellitus with other diabetic neurological complication: Secondary | ICD-10-CM | POA: Diagnosis not present

## 2013-08-02 DIAGNOSIS — I10 Essential (primary) hypertension: Secondary | ICD-10-CM | POA: Diagnosis not present

## 2013-08-02 DIAGNOSIS — I2510 Atherosclerotic heart disease of native coronary artery without angina pectoris: Secondary | ICD-10-CM | POA: Diagnosis not present

## 2013-08-02 DIAGNOSIS — I252 Old myocardial infarction: Secondary | ICD-10-CM | POA: Diagnosis not present

## 2013-08-02 DIAGNOSIS — E785 Hyperlipidemia, unspecified: Secondary | ICD-10-CM | POA: Diagnosis not present

## 2013-08-02 DIAGNOSIS — M159 Polyosteoarthritis, unspecified: Secondary | ICD-10-CM | POA: Diagnosis not present

## 2013-08-02 DIAGNOSIS — E669 Obesity, unspecified: Secondary | ICD-10-CM | POA: Diagnosis not present

## 2013-08-06 ENCOUNTER — Encounter (HOSPITAL_COMMUNITY): Payer: Self-pay | Admitting: Emergency Medicine

## 2013-08-06 ENCOUNTER — Emergency Department (INDEPENDENT_AMBULATORY_CARE_PROVIDER_SITE_OTHER)
Admission: EM | Admit: 2013-08-06 | Discharge: 2013-08-06 | Disposition: A | Discharge status: Home / Self Care | Payer: Medicare Other | Source: Home / Self Care | Attending: Family Medicine | Admitting: Family Medicine

## 2013-08-06 ENCOUNTER — Emergency Department (INDEPENDENT_AMBULATORY_CARE_PROVIDER_SITE_OTHER): Payer: Medicare Other

## 2013-08-06 DIAGNOSIS — L259 Unspecified contact dermatitis, unspecified cause: Secondary | ICD-10-CM

## 2013-08-06 DIAGNOSIS — M19049 Primary osteoarthritis, unspecified hand: Secondary | ICD-10-CM

## 2013-08-06 DIAGNOSIS — L249 Irritant contact dermatitis, unspecified cause: Secondary | ICD-10-CM

## 2013-08-06 DIAGNOSIS — M19041 Primary osteoarthritis, right hand: Secondary | ICD-10-CM

## 2013-08-06 HISTORY — DX: Acute myocardial infarction, unspecified: I21.9

## 2013-08-06 MED ORDER — FLUTICASONE PROPIONATE 0.005 % EX OINT: 1.0000 "application " | TOPICAL_OINTMENT | Freq: Two times a day (BID) | CUTANEOUS | Status: DC

## 2013-08-06 NOTE — ED Provider Notes (Signed)
CSN: [id number]     Arrival date & time 08/06/13  1839 History   First MD Initiated Contact with Patient 08/06/13 1918     No chief complaint on file.  (Consider location/radiation/quality/duration/timing/severity/associated sxs/prior Treatment) Patient is a 63 y.o. female presenting with hand pain. The history is provided by the patient.  Hand Pain This is a new problem. The current episode started 2 days ago. The problem has not changed since onset.Pertinent negatives include no chest pain, no abdominal pain and no shortness of breath.    Past Medical History  Diagnosis Date  . Emphysema   . Colon cancer   . Hypertension   . Hypercholesteremia   . Diabetes mellitus   . COPD (chronic obstructive pulmonary disease)   . Coronary artery disease   . Obesity   . Sleep apnea   . TIA (transient ischemic attack)   . CHF (congestive heart failure)   . Anemia   . DJD (degenerative joint disease)   . Renal insufficiency   . Chronic back pain   . Scoliosis   . Obesity hypoventilation syndrome   . Stroke    Past Surgical History  Procedure Laterality Date  . Coronary angioplasty with stent placement     No family history on file. History  Substance Use Topics  . Smoking status: Former Research scientist (life sciences)  . Smokeless tobacco: Not on file  . Alcohol Use: No   OB History   Grav Para Term Preterm Abortions TAB SAB Ect Mult Living                 Review of Systems  Constitutional: Negative.   Respiratory: Negative for shortness of breath.   Cardiovascular: Negative for chest pain.  Gastrointestinal: Negative for abdominal pain.  Musculoskeletal: Negative for joint swelling.  Skin: Negative for rash.    Allergies  Penicillins  Home Medications   Prior to Admission medications   Medication Sig Start Date End Date Taking? Authorizing Provider  amLODipine (NORVASC) 5 MG tablet Take 1 tablet (5 mg total) by mouth daily. 12/15/10 12/15/11  Clent Demark, MD  aspirin 81 MG tablet Take  81 mg by mouth daily.  12/01/10   Historical Provider, MD  ciprofloxacin (CIPRO) 500 MG tablet Take 1 tablet (500 mg total) by mouth 2 (two) times daily. 02/10/12   Clent Demark, MD  clopidogrel (PLAVIX) 75 MG tablet Take 75 mg by mouth daily.    Historical Provider, MD  ferrous sulfate 325 (65 FE) MG tablet Take 1 tablet (325 mg total) by mouth 2 (two) times daily with a meal. 12/15/10 12/15/11  Clent Demark, MD  insulin glargine (LANTUS) 100 UNIT/ML injection Inject 42 Units into the skin 2 (two) times daily. 12/15/10 12/15/11  Clent Demark, MD  insulin glargine (LANTUS) 100 UNIT/ML injection Inject 45 Units into the skin 2 (two) times daily. 02/10/12 02/09/13  Clent Demark, MD  losartan (COZAAR) 100 MG tablet Take 100 mg by mouth daily.  12/01/10   Historical Provider, MD  metoprolol succinate (TOPROL-XL) 100 MG 24 hr tablet Take 100 mg by mouth daily. Take with or immediately following a meal.    Historical Provider, MD  pantoprazole (PROTONIX) 40 MG tablet Take 1 tablet (40 mg total) by mouth daily at 6 (six) AM. 12/15/10 12/15/11  Clent Demark, MD  rosuvastatin (CRESTOR) 10 MG tablet Take 10 mg by mouth daily.  12/01/10   Historical Provider, MD   There were no vitals taken for  this visit. Physical Exam  Nursing note and vitals reviewed. Constitutional: She is oriented to person, place, and time. She appears well-developed and well-nourished. No distress.  Musculoskeletal: Normal range of motion. She exhibits no tenderness.       Right hand: Normal. She exhibits normal range of motion, no tenderness, no bony tenderness, normal capillary refill, no deformity and no swelling.  Neurological: She is alert and oriented to person, place, and time.  Skin: Skin is warm and dry. No rash noted.    ED Course  Procedures (including critical care time) Labs Review Labs Reviewed - No data to display  Imaging Review No results found. X-rays reviewed and report per radiologist.   MDM   No diagnosis found.     Billy Fischer, MD 08/06/13 1949

## 2013-08-06 NOTE — Discharge Instructions (Signed)
Use ointment as needed, see your doctor if further problems.

## 2013-08-06 NOTE — ED Notes (Signed)
C/O "itching/irritation/light pain" in right hand x 2 days; kept pt up all night.  Hand unremarkable.  Denies any injuries.

## 2013-08-09 DIAGNOSIS — I2510 Atherosclerotic heart disease of native coronary artery without angina pectoris: Secondary | ICD-10-CM | POA: Diagnosis not present

## 2013-08-09 DIAGNOSIS — R0789 Other chest pain: Secondary | ICD-10-CM | POA: Diagnosis not present

## 2013-08-09 DIAGNOSIS — E1149 Type 2 diabetes mellitus with other diabetic neurological complication: Secondary | ICD-10-CM | POA: Diagnosis not present

## 2013-08-09 DIAGNOSIS — I10 Essential (primary) hypertension: Secondary | ICD-10-CM | POA: Diagnosis not present

## 2013-08-09 DIAGNOSIS — E669 Obesity, unspecified: Secondary | ICD-10-CM | POA: Diagnosis not present

## 2013-08-09 DIAGNOSIS — E785 Hyperlipidemia, unspecified: Secondary | ICD-10-CM | POA: Diagnosis not present

## 2013-08-09 DIAGNOSIS — E119 Type 2 diabetes mellitus without complications: Secondary | ICD-10-CM | POA: Diagnosis not present

## 2013-08-09 DIAGNOSIS — I252 Old myocardial infarction: Secondary | ICD-10-CM | POA: Diagnosis not present

## 2013-08-22 DIAGNOSIS — I10 Essential (primary) hypertension: Secondary | ICD-10-CM | POA: Diagnosis not present

## 2013-08-22 DIAGNOSIS — E119 Type 2 diabetes mellitus without complications: Secondary | ICD-10-CM | POA: Diagnosis not present

## 2013-08-22 DIAGNOSIS — E7800 Pure hypercholesterolemia, unspecified: Secondary | ICD-10-CM | POA: Diagnosis not present

## 2013-08-23 DIAGNOSIS — M19039 Primary osteoarthritis, unspecified wrist: Secondary | ICD-10-CM | POA: Diagnosis not present

## 2013-09-07 DIAGNOSIS — R112 Nausea with vomiting, unspecified: Secondary | ICD-10-CM | POA: Diagnosis not present

## 2013-09-07 DIAGNOSIS — I10 Essential (primary) hypertension: Secondary | ICD-10-CM | POA: Diagnosis not present

## 2013-09-07 DIAGNOSIS — I2510 Atherosclerotic heart disease of native coronary artery without angina pectoris: Secondary | ICD-10-CM | POA: Diagnosis not present

## 2013-09-07 DIAGNOSIS — E785 Hyperlipidemia, unspecified: Secondary | ICD-10-CM | POA: Diagnosis not present

## 2013-09-07 DIAGNOSIS — E119 Type 2 diabetes mellitus without complications: Secondary | ICD-10-CM | POA: Diagnosis not present

## 2013-09-07 DIAGNOSIS — E669 Obesity, unspecified: Secondary | ICD-10-CM | POA: Diagnosis not present

## 2013-09-07 DIAGNOSIS — G4733 Obstructive sleep apnea (adult) (pediatric): Secondary | ICD-10-CM | POA: Diagnosis not present

## 2013-09-07 DIAGNOSIS — I252 Old myocardial infarction: Secondary | ICD-10-CM | POA: Diagnosis not present

## 2013-09-20 DIAGNOSIS — M19039 Primary osteoarthritis, unspecified wrist: Secondary | ICD-10-CM | POA: Diagnosis not present

## 2013-10-17 DIAGNOSIS — I2510 Atherosclerotic heart disease of native coronary artery without angina pectoris: Secondary | ICD-10-CM | POA: Diagnosis not present

## 2013-10-17 DIAGNOSIS — I10 Essential (primary) hypertension: Secondary | ICD-10-CM | POA: Diagnosis not present

## 2013-10-17 DIAGNOSIS — E785 Hyperlipidemia, unspecified: Secondary | ICD-10-CM | POA: Diagnosis not present

## 2013-10-17 DIAGNOSIS — N189 Chronic kidney disease, unspecified: Secondary | ICD-10-CM | POA: Diagnosis not present

## 2013-10-17 DIAGNOSIS — IMO0001 Reserved for inherently not codable concepts without codable children: Secondary | ICD-10-CM | POA: Diagnosis not present

## 2013-10-18 DIAGNOSIS — E7800 Pure hypercholesterolemia, unspecified: Secondary | ICD-10-CM | POA: Diagnosis not present

## 2013-10-18 DIAGNOSIS — IMO0001 Reserved for inherently not codable concepts without codable children: Secondary | ICD-10-CM | POA: Diagnosis not present

## 2013-10-18 DIAGNOSIS — I10 Essential (primary) hypertension: Secondary | ICD-10-CM | POA: Diagnosis not present

## 2013-10-25 DIAGNOSIS — M545 Low back pain, unspecified: Secondary | ICD-10-CM | POA: Diagnosis not present

## 2013-11-08 DIAGNOSIS — I2510 Atherosclerotic heart disease of native coronary artery without angina pectoris: Secondary | ICD-10-CM | POA: Diagnosis not present

## 2013-11-08 DIAGNOSIS — I252 Old myocardial infarction: Secondary | ICD-10-CM | POA: Diagnosis not present

## 2013-11-08 DIAGNOSIS — I129 Hypertensive chronic kidney disease with stage 1 through stage 4 chronic kidney disease, or unspecified chronic kidney disease: Secondary | ICD-10-CM | POA: Diagnosis not present

## 2013-11-08 DIAGNOSIS — E785 Hyperlipidemia, unspecified: Secondary | ICD-10-CM | POA: Diagnosis not present

## 2013-11-08 DIAGNOSIS — E119 Type 2 diabetes mellitus without complications: Secondary | ICD-10-CM | POA: Diagnosis not present

## 2013-11-08 DIAGNOSIS — E669 Obesity, unspecified: Secondary | ICD-10-CM | POA: Diagnosis not present

## 2013-11-08 DIAGNOSIS — I10 Essential (primary) hypertension: Secondary | ICD-10-CM | POA: Diagnosis not present

## 2013-11-08 DIAGNOSIS — N182 Chronic kidney disease, stage 2 (mild): Secondary | ICD-10-CM | POA: Diagnosis not present

## 2014-02-10 ENCOUNTER — Encounter (HOSPITAL_COMMUNITY): Payer: Self-pay | Admitting: Emergency Medicine

## 2014-02-10 ENCOUNTER — Emergency Department (HOSPITAL_COMMUNITY)
Admission: EM | Admit: 2014-02-10 | Discharge: 2014-02-10 | Disposition: A | Discharge status: Home / Self Care | Payer: Medicare HMO | Attending: Emergency Medicine | Admitting: Emergency Medicine

## 2014-02-10 ENCOUNTER — Emergency Department (HOSPITAL_COMMUNITY): Payer: Medicare HMO

## 2014-02-10 DIAGNOSIS — D649 Anemia, unspecified: Secondary | ICD-10-CM | POA: Diagnosis not present

## 2014-02-10 DIAGNOSIS — I252 Old myocardial infarction: Secondary | ICD-10-CM | POA: Insufficient documentation

## 2014-02-10 DIAGNOSIS — Z9861 Coronary angioplasty status: Secondary | ICD-10-CM | POA: Diagnosis not present

## 2014-02-10 DIAGNOSIS — R109 Unspecified abdominal pain: Secondary | ICD-10-CM

## 2014-02-10 DIAGNOSIS — Z792 Long term (current) use of antibiotics: Secondary | ICD-10-CM | POA: Insufficient documentation

## 2014-02-10 DIAGNOSIS — I129 Hypertensive chronic kidney disease with stage 1 through stage 4 chronic kidney disease, or unspecified chronic kidney disease: Secondary | ICD-10-CM | POA: Insufficient documentation

## 2014-02-10 DIAGNOSIS — Z7982 Long term (current) use of aspirin: Secondary | ICD-10-CM | POA: Insufficient documentation

## 2014-02-10 DIAGNOSIS — Z79899 Other long term (current) drug therapy: Secondary | ICD-10-CM | POA: Insufficient documentation

## 2014-02-10 DIAGNOSIS — E1165 Type 2 diabetes mellitus with hyperglycemia: Secondary | ICD-10-CM | POA: Insufficient documentation

## 2014-02-10 DIAGNOSIS — E669 Obesity, unspecified: Secondary | ICD-10-CM | POA: Diagnosis not present

## 2014-02-10 DIAGNOSIS — Z8673 Personal history of transient ischemic attack (TIA), and cerebral infarction without residual deficits: Secondary | ICD-10-CM | POA: Insufficient documentation

## 2014-02-10 DIAGNOSIS — G8929 Other chronic pain: Secondary | ICD-10-CM | POA: Diagnosis not present

## 2014-02-10 DIAGNOSIS — E780 Pure hypercholesterolemia: Secondary | ICD-10-CM | POA: Insufficient documentation

## 2014-02-10 DIAGNOSIS — J449 Chronic obstructive pulmonary disease, unspecified: Secondary | ICD-10-CM | POA: Insufficient documentation

## 2014-02-10 DIAGNOSIS — N1330 Unspecified hydronephrosis: Secondary | ICD-10-CM | POA: Diagnosis not present

## 2014-02-10 DIAGNOSIS — N200 Calculus of kidney: Secondary | ICD-10-CM | POA: Insufficient documentation

## 2014-02-10 DIAGNOSIS — M419 Scoliosis, unspecified: Secondary | ICD-10-CM | POA: Insufficient documentation

## 2014-02-10 DIAGNOSIS — R112 Nausea with vomiting, unspecified: Secondary | ICD-10-CM | POA: Diagnosis not present

## 2014-02-10 DIAGNOSIS — I509 Heart failure, unspecified: Secondary | ICD-10-CM | POA: Diagnosis not present

## 2014-02-10 DIAGNOSIS — I2510 Atherosclerotic heart disease of native coronary artery without angina pectoris: Secondary | ICD-10-CM | POA: Diagnosis not present

## 2014-02-10 DIAGNOSIS — R1031 Right lower quadrant pain: Secondary | ICD-10-CM | POA: Diagnosis not present

## 2014-02-10 DIAGNOSIS — Z7952 Long term (current) use of systemic steroids: Secondary | ICD-10-CM | POA: Diagnosis not present

## 2014-02-10 DIAGNOSIS — N201 Calculus of ureter: Secondary | ICD-10-CM | POA: Diagnosis not present

## 2014-02-10 DIAGNOSIS — Z880 Allergy status to penicillin: Secondary | ICD-10-CM | POA: Insufficient documentation

## 2014-02-10 DIAGNOSIS — Z87891 Personal history of nicotine dependence: Secondary | ICD-10-CM | POA: Diagnosis not present

## 2014-02-10 DIAGNOSIS — M1990 Unspecified osteoarthritis, unspecified site: Secondary | ICD-10-CM | POA: Insufficient documentation

## 2014-02-10 DIAGNOSIS — Z794 Long term (current) use of insulin: Secondary | ICD-10-CM | POA: Diagnosis not present

## 2014-02-10 DIAGNOSIS — N189 Chronic kidney disease, unspecified: Secondary | ICD-10-CM | POA: Insufficient documentation

## 2014-02-10 DIAGNOSIS — Z8669 Personal history of other diseases of the nervous system and sense organs: Secondary | ICD-10-CM | POA: Insufficient documentation

## 2014-02-10 DIAGNOSIS — K2970 Gastritis, unspecified, without bleeding: Secondary | ICD-10-CM | POA: Diagnosis not present

## 2014-02-10 DIAGNOSIS — Z85038 Personal history of other malignant neoplasm of large intestine: Secondary | ICD-10-CM | POA: Insufficient documentation

## 2014-02-10 LAB — COMPREHENSIVE METABOLIC PANEL
ALT: 20 U/L (ref 0–35)
AST: 30 U/L (ref 0–37)
Albumin: 4.2 g/dL (ref 3.5–5.2)
Alkaline Phosphatase: 110 U/L (ref 39–117)
Anion gap: 10 (ref 5–15)
BUN: 34 mg/dL — ABNORMAL HIGH (ref 6–23)
CO2: 18 mmol/L — ABNORMAL LOW (ref 19–32)
Calcium: 9.3 mg/dL (ref 8.4–10.5)
Chloride: 109 mEq/L (ref 96–112)
Creatinine, Ser: 2.49 mg/dL — ABNORMAL HIGH (ref 0.50–1.10)
GFR calc Af Amer: 23 mL/min — ABNORMAL LOW (ref >90)
GFR calc non Af Amer: 20 mL/min — ABNORMAL LOW (ref >90)
Glucose, Bld: 391 mg/dL — ABNORMAL HIGH (ref 70–99)
Potassium: 4.9 mmol/L (ref 3.5–5.1)
Sodium: 137 mmol/L (ref 135–145)
Total Bilirubin: 1 mg/dL (ref 0.3–1.2)
Total Protein: 7.9 g/dL (ref 6.0–8.3)

## 2014-02-10 LAB — URINE MICROSCOPIC-ADD ON

## 2014-02-10 LAB — CBC
HCT: 32.1 % — ABNORMAL LOW (ref 36.0–46.0)
Hemoglobin: 10.9 g/dL — ABNORMAL LOW (ref 12.0–15.0)
MCH: 30.7 pg (ref 26.0–34.0)
MCHC: 34 g/dL (ref 30.0–36.0)
MCV: 90.4 fL (ref 78.0–100.0)
Platelets: 252 10*3/uL (ref 150–400)
RBC: 3.55 MIL/uL — ABNORMAL LOW (ref 3.87–5.11)
RDW: 13.7 % (ref 11.5–15.5)
WBC: 9.1 10*3/uL (ref 4.0–10.5)

## 2014-02-10 LAB — URINALYSIS, ROUTINE W REFLEX MICROSCOPIC
Bilirubin Urine: NEGATIVE
Glucose, UA: >1000 mg/dL — AB
Ketones, ur: NEGATIVE mg/dL
Nitrite: NEGATIVE
Protein, ur: 30 mg/dL — AB
Specific Gravity, Urine: 1.015 (ref 1.005–1.030)
Urobilinogen, UA: 0.2 mg/dL (ref 0.0–1.0)
pH: 5.5 (ref 5.0–8.0)

## 2014-02-10 LAB — CBG MONITORING, ED: Glucose-Capillary: 384 mg/dL — ABNORMAL HIGH (ref 70–99)

## 2014-02-10 MED ORDER — MORPHINE SULFATE 4 MG/ML IJ SOLN
4.0000 mg | Freq: Once | INTRAMUSCULAR | Status: AC
  Administered 2014-02-10: 4 mg via INTRAVENOUS
  Filled 2014-02-10: qty 1

## 2014-02-10 MED ORDER — ONDANSETRON HCL 4 MG/2ML IJ SOLN
4.0000 mg | Freq: Once | INTRAMUSCULAR | Status: AC
  Administered 2014-02-10: 4 mg via INTRAVENOUS
  Filled 2014-02-10: qty 2

## 2014-02-10 MED ORDER — ONDANSETRON HCL 4 MG PO TABS: 4.0000 mg | ORAL_TABLET | Freq: Four times a day (QID) | ORAL | Status: DC

## 2014-02-10 MED ORDER — SODIUM CHLORIDE 0.9 % IV BOLUS (SEPSIS)
1000.0000 mL | Freq: Once | INTRAVENOUS | Status: AC
  Administered 2014-02-10: 1000 mL via INTRAVENOUS

## 2014-02-10 MED ORDER — OXYCODONE-ACETAMINOPHEN 5-325 MG PO TABS
1.0000 | ORAL_TABLET | Freq: Once | ORAL | Status: AC
  Administered 2014-02-10: 1 via ORAL
  Filled 2014-02-10: qty 1

## 2014-02-10 MED ORDER — OXYCODONE-ACETAMINOPHEN 5-325 MG PO TABS: 1.0000 | ORAL_TABLET | Freq: Four times a day (QID) | ORAL | Status: DC | PRN

## 2014-02-10 NOTE — ED Notes (Signed)
Bed: [id number] Expected date: 02/10/14 Expected time: 2:17 PM Means of arrival:  Comments: Hyperglycemia N/V

## 2014-02-10 NOTE — ED Notes (Signed)
Please do not shut patients door, per patient's request she states she is claustrophobic.

## 2014-02-10 NOTE — ED Provider Notes (Addendum)
CSN: [id number]     Arrival date & time 02/10/14  1420 History   First MD Initiated Contact with Patient 02/10/14 1532     Chief Complaint  Patient presents with  . Hyperglycemia     (Consider location/radiation/quality/duration/timing/severity/associated sxs/prior Treatment) Patient is a 64 y.o. female presenting with abdominal pain. The history is provided by the patient.  Abdominal Pain Pain location:  RLQ Pain quality: sharp   Pain quality: not gnawing   Pain radiates to:  Does not radiate Pain severity:  Moderate Onset quality:  Gradual Duration:  1 day Timing:  Constant Progression:  Worsening Chronicity:  New Context: previous surgery   Context: not diet changes, not eating, not recent travel and not sick contacts   Relieved by:  Nothing Worsened by:  Eating Associated symptoms: anorexia, nausea and vomiting   Associated symptoms: no constipation, no cough, no diarrhea, no dysuria and no fever   Risk factors comment:  Colon resection for cancer   Past Medical History  Diagnosis Date  . Emphysema   . Colon cancer   . Hypertension   . Hypercholesteremia   . Diabetes mellitus   . COPD (chronic obstructive pulmonary disease)   . Coronary artery disease   . Obesity   . Sleep apnea   . TIA (transient ischemic attack)   . CHF (congestive heart failure)   . Anemia   . DJD (degenerative joint disease)   . Renal insufficiency   . Chronic back pain   . Scoliosis   . Obesity hypoventilation syndrome   . Stroke   . Myocardial infarction    Past Surgical History  Procedure Laterality Date  . Coronary angioplasty with stent placement    . Colon surgery     No family history on file. History  Substance Use Topics  . Smoking status: Former Research scientist (life sciences)  . Smokeless tobacco: Not on file  . Alcohol Use: No   OB History    No data available     Review of Systems  Constitutional: Negative for fever.  Respiratory: Negative for cough.   Gastrointestinal: Positive for  nausea, vomiting, abdominal pain and anorexia. Negative for diarrhea and constipation.  Genitourinary: Negative for dysuria.  All other systems reviewed and are negative.     Allergies  Penicillins  Home Medications   Prior to Admission medications   Medication Sig Start Date End Date Taking? Authorizing Provider  amLODipine (NORVASC) 5 MG tablet Take 1 tablet (5 mg total) by mouth daily. 12/15/10 08/06/13  Clent Demark, MD  aspirin 81 MG tablet Take 81 mg by mouth daily.  12/01/10   Historical Provider, MD  ciprofloxacin (CIPRO) 500 MG tablet Take 1 tablet (500 mg total) by mouth 2 (two) times daily. 02/10/12   Clent Demark, MD  clopidogrel (PLAVIX) 75 MG tablet Take 75 mg by mouth daily.    Historical Provider, MD  ferrous sulfate 325 (65 FE) MG tablet Take 1 tablet (325 mg total) by mouth 2 (two) times daily with a meal. 12/15/10 12/15/11  Clent Demark, MD  fluticasone (CUTIVATE) 0.005 % ointment Apply 1 application topically 2 (two) times daily. 08/06/13   Billy Fischer, MD  gemfibrozil (LOPID) 600 MG tablet Take 600 mg by mouth 2 (two) times daily before a meal.    Historical Provider, MD  insulin glargine (LANTUS) 100 UNIT/ML injection Inject 55 Units into the skin 2 (two) times daily. 55 units in morning; 65 units at bedtime 12/15/10 08/06/13  Prudencio Burly  Daivd Council, MD  insulin glargine (LANTUS) 100 UNIT/ML injection Inject 45 Units into the skin 2 (two) times daily. 02/10/12 02/09/13  Clent Demark, MD  losartan (COZAAR) 100 MG tablet Take 100 mg by mouth daily.  12/01/10   Historical Provider, MD  metoprolol succinate (TOPROL-XL) 100 MG 24 hr tablet Take 100 mg by mouth daily. Take with or immediately following a meal.    Historical Provider, MD  pantoprazole (PROTONIX) 40 MG tablet Take 1 tablet (40 mg total) by mouth daily at 6 (six) AM. 12/15/10 08/06/13  Clent Demark, MD  rosuvastatin (CRESTOR) 10 MG tablet Take 10 mg by mouth daily.  12/01/10   Historical Provider, MD   BP  195/71 mmHg  Pulse 65  Temp(Src) 97.9 F (36.6 C) (Oral)  Resp 18  SpO2 96% Physical Exam  Constitutional: She is oriented to person, place, and time. She appears well-developed and well-nourished. No distress.  obese  HENT:  Head: Normocephalic and atraumatic.  Mouth/Throat: Oropharynx is clear and moist.  Eyes: Conjunctivae and EOM are normal. Pupils are equal, round, and reactive to light.  Neck: Normal range of motion. Neck supple.  Cardiovascular: Normal rate, regular rhythm and intact distal pulses.   No murmur heard. Pulmonary/Chest: Effort normal and breath sounds normal. No respiratory distress. She has no wheezes. She has no rales.  Abdominal: Soft. She exhibits no distension. Bowel sounds are decreased. There is tenderness in the right lower quadrant. There is guarding. There is no rebound.  Musculoskeletal: Normal range of motion. She exhibits no edema or tenderness.  Neurological: She is alert and oriented to person, place, and time.  Skin: Skin is warm and dry. No rash noted. No erythema.  Psychiatric: She has a normal mood and affect. Her behavior is normal.  Nursing note and vitals reviewed.   ED Course  Procedures (including critical care time) Labs Review Labs Reviewed  CBC - Abnormal; Notable for the following:    RBC 3.55 (*)    Hemoglobin 10.9 (*)    HCT 32.1 (*)    All other components within normal limits  COMPREHENSIVE METABOLIC PANEL - Abnormal; Notable for the following:    CO2 18 (*)    Glucose, Bld 391 (*)    BUN 34 (*)    Creatinine, Ser 2.49 (*)    GFR calc non Af Amer 20 (*)    GFR calc Af Amer 23 (*)    All other components within normal limits  URINALYSIS, ROUTINE W REFLEX MICROSCOPIC - Abnormal; Notable for the following:    APPearance CLOUDY (*)    Glucose, UA >1000 (*)    Hgb urine dipstick LARGE (*)    Protein, ur 30 (*)    Leukocytes, UA TRACE (*)    All other components within normal limits  CBG MONITORING, ED - Abnormal; Notable  for the following:    Glucose-Capillary 384 (*)    All other components within normal limits  URINE MICROSCOPIC-ADD ON    Imaging Review Ct Abdomen Pelvis Wo Contrast  02/10/2014   CLINICAL DATA:  Lower abdominal pain associated with nausea and vomiting that began at 0600 hr this morning. Prior history of colon cancer post surgical resection.  EXAM: CT ABDOMEN AND PELVIS WITHOUT CONTRAST  TECHNIQUE: Multidetector CT imaging of the abdomen and pelvis was performed following the standard protocol without IV contrast.  COMPARISON:  None.  FINDINGS: Approximate 4 mm calculus in the proximal right ureter at or just beyond the UPJ  causing moderate right hydronephrosis, right renal edema and right perinephric edema. Non obstructing approximate 2 mm calculus in the proximal left ureter at or near the UVJ, with minimal left hydronephrosis, but no evidence of renal or perinephric edema. Numerous bilateral renal calculi. Within the limits of the unenhanced technique, no focal parenchymal abnormality involving either kidney.  Allowing for the unenhanced technique, no focal abnormality involving the liver, though there is relative enlargement of the left lobe and caudate lobe when compared to the right lobe. Gallbladder surgically absent. No unexpected biliary ductal dilation. Normal appearing spleen, pancreas, and adrenal glands. Mild to moderate aorto iliac atherosclerosis without aneurysm. No significant lymphadenopathy.  Stomach decompressed and unremarkable. Normal-appearing small bowel. Widely patent ileocolic anastomosis in the right upper pelvis. Entire colon relatively decompressed. Sigmoid colon diverticulosis without evidence of acute diverticulitis. No ascites.  Uterus markedly atrophic. Normal appearing ovaries bilaterally. No adnexal masses or free pelvic fluid. Numerous pelvic phleboliths.  Bone window images demonstrate severe thoracolumbar scoliosis convex right, DISH involving the lower thoracic spine,  multilevel degenerative disc disease, spondylosis and facet degenerative changes throughout the lumbar spine, severe multifactorial spinal stenosis at the L3-4 and L4-5 levels. Visualized lung bases clear apart from the expected dependent atelectasis posteriorly. Heart moderately enlarged.  IMPRESSION: 1. Obstructing approximate 4 mm proximal right ureteral calculus causing moderate hydronephrosis. 2. Minimally obstructing or nonobstructing approximate 2 mm calculus in the proximal left ureter, causing mild left hydronephrosis but no renal or perinephric edema. 3. Possible hepatic cirrhosis as there is relative enlargement of the left lobe and caudate lobe when compared to the right lobe. 4. Sigmoid colon diverticulosis without evidence of acute diverticulitis.   Electronically Signed   By: Evangeline Dakin M.D.   On: 02/10/2014 17:48     EKG Interpretation None      MDM   Final diagnoses:  Abdominal pain  Kidney stone on right side  Chronic renal insufficiency, unspecified stage    Patient with a prior history of diabetes and hypertension who developed abdominal pain around 6 PM last night in the right lower quadrant that has worsened today. She has been vomiting since 5 AM and unable to hold anything down. She denies fever. She did not take her blood pressure medication today because of her vomiting. She has hypertensive here but is most likely because she has not had her blood pressure medicine. Also she has not taken her diabetic medication as well.  She has had normal bowel movements and is passing gas without symptoms concerning for obstruction. She does have a prior history of colon cancer status post resection. She is unsure if she has had a prior appendectomy.  She denies any shortness of breath or chest pain.  CBC within normal limits. CMP with findings of renal insufficiency with a creatinine of 2.4 however the last creatinine we have is from 2 years ago which was 1.3. She states her  doctor follows this and is not sure what her numbers have been in the past. UA without findings concerning for infection but pt does have hb in urine concerning for kidney stone.  Feel patient needs a CT for further evaluation of appendicitis versus diverticulitis. No symptoms or exam findings concerning for cholecystitis or pancreatitis. Patient does not use NSAIDs or alcohol.  5:54 PM CT consistent with proximal ureter 30mm stone with hydronephrosis.  Pt discussed with Dr. Jeffie Pollock and her pain and nausea are improved. Unclear what pt's baseline Cr is as the last creatinine we have  was from 2 years ago.  No signs of UTI.  Will d/c pt home with f/u with Urology at the beginning of the week for further care and recheck creatinine.  8:06 PM Pt drinking and eating and pain is better.  Blanchie Dessert, MD 02/10/14 1754  Blanchie Dessert, MD 02/10/14 2006  Blanchie Dessert, MD 02/10/14 2008

## 2014-02-10 NOTE — ED Notes (Signed)
Walked patient she stated that she did not feel any pain and was ok to walk.

## 2014-02-10 NOTE — ED Notes (Addendum)
Per EMS pt awoke with nausea and vomiting with complaint of lower abdominal pain starting at 0600. Pt CBG 440 with EMS.

## 2014-03-06 DIAGNOSIS — G5601 Carpal tunnel syndrome, right upper limb: Secondary | ICD-10-CM | POA: Diagnosis not present

## 2014-03-06 DIAGNOSIS — G5602 Carpal tunnel syndrome, left upper limb: Secondary | ICD-10-CM | POA: Diagnosis not present

## 2014-03-24 DIAGNOSIS — J449 Chronic obstructive pulmonary disease, unspecified: Secondary | ICD-10-CM | POA: Diagnosis not present

## 2014-03-26 DIAGNOSIS — M545 Low back pain: Secondary | ICD-10-CM | POA: Diagnosis not present

## 2014-04-04 DIAGNOSIS — M545 Low back pain: Secondary | ICD-10-CM | POA: Diagnosis not present

## 2014-04-22 DIAGNOSIS — J449 Chronic obstructive pulmonary disease, unspecified: Secondary | ICD-10-CM | POA: Diagnosis not present

## 2014-04-25 DIAGNOSIS — M545 Low back pain: Secondary | ICD-10-CM | POA: Diagnosis not present

## 2014-05-16 DIAGNOSIS — I252 Old myocardial infarction: Secondary | ICD-10-CM | POA: Diagnosis not present

## 2014-05-16 DIAGNOSIS — I2510 Atherosclerotic heart disease of native coronary artery without angina pectoris: Secondary | ICD-10-CM | POA: Diagnosis not present

## 2014-05-16 DIAGNOSIS — I10 Essential (primary) hypertension: Secondary | ICD-10-CM | POA: Diagnosis not present

## 2014-05-16 DIAGNOSIS — N189 Chronic kidney disease, unspecified: Secondary | ICD-10-CM | POA: Diagnosis not present

## 2014-05-16 DIAGNOSIS — E119 Type 2 diabetes mellitus without complications: Secondary | ICD-10-CM | POA: Diagnosis not present

## 2014-05-17 DIAGNOSIS — I10 Essential (primary) hypertension: Secondary | ICD-10-CM | POA: Diagnosis not present

## 2014-05-17 DIAGNOSIS — E119 Type 2 diabetes mellitus without complications: Secondary | ICD-10-CM | POA: Diagnosis not present

## 2014-05-23 DIAGNOSIS — M4806 Spinal stenosis, lumbar region: Secondary | ICD-10-CM | POA: Diagnosis not present

## 2014-05-23 DIAGNOSIS — M545 Low back pain: Secondary | ICD-10-CM | POA: Diagnosis not present

## 2014-05-23 DIAGNOSIS — J449 Chronic obstructive pulmonary disease, unspecified: Secondary | ICD-10-CM | POA: Diagnosis not present

## 2014-06-12 DIAGNOSIS — M4806 Spinal stenosis, lumbar region: Secondary | ICD-10-CM | POA: Diagnosis not present

## 2014-06-12 DIAGNOSIS — M545 Low back pain: Secondary | ICD-10-CM | POA: Diagnosis not present

## 2014-06-20 ENCOUNTER — Observation Stay (HOSPITAL_COMMUNITY): Payer: Medicare Other

## 2014-06-20 ENCOUNTER — Emergency Department (HOSPITAL_COMMUNITY): Payer: Medicare Other

## 2014-06-20 ENCOUNTER — Encounter (HOSPITAL_COMMUNITY): Payer: Self-pay | Admitting: *Deleted

## 2014-06-20 ENCOUNTER — Inpatient Hospital Stay (HOSPITAL_COMMUNITY)
Admission: EM | Admit: 2014-06-20 | Discharge: 2014-06-22 | DRG: 392 | Disposition: A | Discharge status: Home / Self Care | Payer: Medicare Other | Attending: Cardiology | Admitting: Cardiology

## 2014-06-20 DIAGNOSIS — M1990 Unspecified osteoarthritis, unspecified site: Secondary | ICD-10-CM | POA: Diagnosis present

## 2014-06-20 DIAGNOSIS — I252 Old myocardial infarction: Secondary | ICD-10-CM | POA: Diagnosis not present

## 2014-06-20 DIAGNOSIS — J449 Chronic obstructive pulmonary disease, unspecified: Secondary | ICD-10-CM | POA: Diagnosis present

## 2014-06-20 DIAGNOSIS — Z87891 Personal history of nicotine dependence: Secondary | ICD-10-CM

## 2014-06-20 DIAGNOSIS — I129 Hypertensive chronic kidney disease with stage 1 through stage 4 chronic kidney disease, or unspecified chronic kidney disease: Secondary | ICD-10-CM | POA: Diagnosis not present

## 2014-06-20 DIAGNOSIS — I509 Heart failure, unspecified: Secondary | ICD-10-CM | POA: Diagnosis not present

## 2014-06-20 DIAGNOSIS — Z85038 Personal history of other malignant neoplasm of large intestine: Secondary | ICD-10-CM

## 2014-06-20 DIAGNOSIS — N184 Chronic kidney disease, stage 4 (severe): Secondary | ICD-10-CM | POA: Diagnosis not present

## 2014-06-20 DIAGNOSIS — E860 Dehydration: Secondary | ICD-10-CM | POA: Diagnosis present

## 2014-06-20 DIAGNOSIS — N179 Acute kidney failure, unspecified: Secondary | ICD-10-CM | POA: Diagnosis not present

## 2014-06-20 DIAGNOSIS — Z955 Presence of coronary angioplasty implant and graft: Secondary | ICD-10-CM

## 2014-06-20 DIAGNOSIS — R109 Unspecified abdominal pain: Secondary | ICD-10-CM

## 2014-06-20 DIAGNOSIS — R1084 Generalized abdominal pain: Secondary | ICD-10-CM | POA: Diagnosis not present

## 2014-06-20 DIAGNOSIS — Z6838 Body mass index (BMI) 38.0-38.9, adult: Secondary | ICD-10-CM

## 2014-06-20 DIAGNOSIS — Z880 Allergy status to penicillin: Secondary | ICD-10-CM | POA: Diagnosis not present

## 2014-06-20 DIAGNOSIS — A084 Viral intestinal infection, unspecified: Secondary | ICD-10-CM | POA: Diagnosis not present

## 2014-06-20 DIAGNOSIS — Z794 Long term (current) use of insulin: Secondary | ICD-10-CM

## 2014-06-20 DIAGNOSIS — Z87442 Personal history of urinary calculi: Secondary | ICD-10-CM

## 2014-06-20 DIAGNOSIS — I2510 Atherosclerotic heart disease of native coronary artery without angina pectoris: Secondary | ICD-10-CM | POA: Diagnosis not present

## 2014-06-20 DIAGNOSIS — E119 Type 2 diabetes mellitus without complications: Secondary | ICD-10-CM | POA: Diagnosis not present

## 2014-06-20 DIAGNOSIS — R1030 Lower abdominal pain, unspecified: Secondary | ICD-10-CM | POA: Diagnosis present

## 2014-06-20 DIAGNOSIS — E662 Morbid (severe) obesity with alveolar hypoventilation: Secondary | ICD-10-CM | POA: Diagnosis present

## 2014-06-20 DIAGNOSIS — E780 Pure hypercholesterolemia: Secondary | ICD-10-CM | POA: Diagnosis present

## 2014-06-20 DIAGNOSIS — R112 Nausea with vomiting, unspecified: Secondary | ICD-10-CM | POA: Diagnosis not present

## 2014-06-20 DIAGNOSIS — N200 Calculus of kidney: Secondary | ICD-10-CM | POA: Diagnosis not present

## 2014-06-20 DIAGNOSIS — D638 Anemia in other chronic diseases classified elsewhere: Secondary | ICD-10-CM | POA: Diagnosis present

## 2014-06-20 DIAGNOSIS — Z8673 Personal history of transient ischemic attack (TIA), and cerebral infarction without residual deficits: Secondary | ICD-10-CM | POA: Diagnosis not present

## 2014-06-20 LAB — CBC WITH DIFFERENTIAL/PLATELET
Basophils Absolute: 0 10*3/uL (ref 0.0–0.1)
Basophils Absolute: 0 10*3/uL (ref 0.0–0.1)
Basophils Relative: 0 % (ref 0–1)
Basophils Relative: 0 % (ref 0–1)
Eosinophils Absolute: 0.1 10*3/uL (ref 0.0–0.7)
Eosinophils Absolute: 0.1 10*3/uL (ref 0.0–0.7)
Eosinophils Relative: 1 % (ref 0–5)
Eosinophils Relative: 1 % (ref 0–5)
HCT: 28.4 % — ABNORMAL LOW (ref 36.0–46.0)
HCT: 29.4 % — ABNORMAL LOW (ref 36.0–46.0)
Hemoglobin: 9.5 g/dL — ABNORMAL LOW (ref 12.0–15.0)
Hemoglobin: 9.7 g/dL — ABNORMAL LOW (ref 12.0–15.0)
Lymphocytes Relative: 10 % — ABNORMAL LOW (ref 12–46)
Lymphocytes Relative: 9 % — ABNORMAL LOW (ref 12–46)
Lymphs Abs: 0.7 10*3/uL (ref 0.7–4.0)
Lymphs Abs: 0.6 10*3/uL — ABNORMAL LOW (ref 0.7–4.0)
MCH: 31.3 pg (ref 26.0–34.0)
MCH: 30.9 pg (ref 26.0–34.0)
MCHC: 33.5 g/dL (ref 30.0–36.0)
MCHC: 33 g/dL (ref 30.0–36.0)
MCV: 93.4 fL (ref 78.0–100.0)
MCV: 93.6 fL (ref 78.0–100.0)
Monocytes Absolute: 0.6 10*3/uL (ref 0.1–1.0)
Monocytes Absolute: 0.8 10*3/uL (ref 0.1–1.0)
Monocytes Relative: 9 % (ref 3–12)
Monocytes Relative: 12 % (ref 3–12)
Neutro Abs: 5.6 10*3/uL (ref 1.7–7.7)
Neutro Abs: 5.1 10*3/uL (ref 1.7–7.7)
Neutrophils Relative %: 80 % — ABNORMAL HIGH (ref 43–77)
Neutrophils Relative %: 78 % — ABNORMAL HIGH (ref 43–77)
Platelets: 126 10*3/uL — ABNORMAL LOW (ref 150–400)
Platelets: 121 10*3/uL — ABNORMAL LOW (ref 150–400)
RBC: 3.04 MIL/uL — ABNORMAL LOW (ref 3.87–5.11)
RBC: 3.14 MIL/uL — ABNORMAL LOW (ref 3.87–5.11)
RDW: 14.1 % (ref 11.5–15.5)
RDW: 14.1 % (ref 11.5–15.5)
WBC: 7 10*3/uL (ref 4.0–10.5)
WBC: 6.5 10*3/uL (ref 4.0–10.5)

## 2014-06-20 LAB — COMPREHENSIVE METABOLIC PANEL
ALT: 18 U/L (ref 14–54)
ALT: 20 U/L (ref 14–54)
AST: 18 U/L (ref 15–41)
AST: 20 U/L (ref 15–41)
Albumin: 3.4 g/dL — ABNORMAL LOW (ref 3.5–5.0)
Albumin: 3.3 g/dL — ABNORMAL LOW (ref 3.5–5.0)
Alkaline Phosphatase: 105 U/L (ref 38–126)
Alkaline Phosphatase: 142 U/L — ABNORMAL HIGH (ref 38–126)
Anion gap: 10 (ref 5–15)
Anion gap: 10 (ref 5–15)
BUN: 43 mg/dL — ABNORMAL HIGH (ref 6–20)
BUN: 44 mg/dL — ABNORMAL HIGH (ref 6–20)
CO2: 18 mmol/L — ABNORMAL LOW (ref 22–32)
CO2: 19 mmol/L — ABNORMAL LOW (ref 22–32)
Calcium: 8.8 mg/dL — ABNORMAL LOW (ref 8.9–10.3)
Calcium: 8.7 mg/dL — ABNORMAL LOW (ref 8.9–10.3)
Chloride: 111 mmol/L (ref 101–111)
Chloride: 111 mmol/L (ref 101–111)
Creatinine, Ser: 3.58 mg/dL — ABNORMAL HIGH (ref 0.44–1.00)
Creatinine, Ser: 3.41 mg/dL — ABNORMAL HIGH (ref 0.44–1.00)
GFR calc Af Amer: 15 mL/min — ABNORMAL LOW (ref >60)
GFR calc Af Amer: 15 mL/min — ABNORMAL LOW (ref >60)
GFR calc non Af Amer: 13 mL/min — ABNORMAL LOW (ref >60)
GFR calc non Af Amer: 13 mL/min — ABNORMAL LOW (ref >60)
Glucose, Bld: 236 mg/dL — ABNORMAL HIGH (ref 65–99)
Glucose, Bld: 207 mg/dL — ABNORMAL HIGH (ref 65–99)
Potassium: 4.1 mmol/L (ref 3.5–5.1)
Potassium: 4.6 mmol/L (ref 3.5–5.1)
Sodium: 139 mmol/L (ref 135–145)
Sodium: 140 mmol/L (ref 135–145)
Total Bilirubin: 0.8 mg/dL (ref 0.3–1.2)
Total Bilirubin: 0.9 mg/dL (ref 0.3–1.2)
Total Protein: 7.5 g/dL (ref 6.5–8.1)
Total Protein: 7.5 g/dL (ref 6.5–8.1)

## 2014-06-20 LAB — URINALYSIS, ROUTINE W REFLEX MICROSCOPIC
Bilirubin Urine: NEGATIVE
Glucose, UA: 100 mg/dL — AB
Ketones, ur: NEGATIVE mg/dL
Nitrite: NEGATIVE
Protein, ur: 100 mg/dL — AB
Specific Gravity, Urine: 1.018 (ref 1.005–1.030)
Urobilinogen, UA: 0.2 mg/dL (ref 0.0–1.0)
pH: 5.5 (ref 5.0–8.0)

## 2014-06-20 LAB — URINE MICROSCOPIC-ADD ON

## 2014-06-20 LAB — I-STAT TROPONIN, ED: Troponin i, poc: 0.01 ng/mL (ref 0.00–0.08)

## 2014-06-20 LAB — AMYLASE: Amylase: 47 U/L (ref 28–100)

## 2014-06-20 LAB — GLUCOSE, CAPILLARY
Glucose-Capillary: 302 mg/dL — ABNORMAL HIGH (ref 65–99)
Glucose-Capillary: 264 mg/dL — ABNORMAL HIGH (ref 65–99)
Glucose-Capillary: 200 mg/dL — ABNORMAL HIGH (ref 65–99)

## 2014-06-20 LAB — LIPASE, BLOOD
Lipase: 24 U/L (ref 22–51)
Lipase: 21 U/L — ABNORMAL LOW (ref 22–51)

## 2014-06-20 LAB — I-STAT CG4 LACTIC ACID, ED
Lactic Acid, Venous: 0.79 mmol/L (ref 0.5–2.0)
Lactic Acid, Venous: 0.55 mmol/L (ref 0.5–2.0)

## 2014-06-20 MED ORDER — METOPROLOL SUCCINATE ER 100 MG PO TB24
100.0000 mg | ORAL_TABLET | Freq: Every day | ORAL | Status: DC
  Administered 2014-06-20 – 2014-06-22 (×3): 100 mg via ORAL
  Filled 2014-06-20 (×3): qty 1

## 2014-06-20 MED ORDER — LOSARTAN POTASSIUM 50 MG PO TABS: 100.0000 mg | ORAL_TABLET | Freq: Every day | ORAL | Status: DC

## 2014-06-20 MED ORDER — FERROUS SULFATE 325 (65 FE) MG PO TABS
325.0000 mg | ORAL_TABLET | Freq: Two times a day (BID) | ORAL | Status: DC
  Administered 2014-06-20 – 2014-06-22 (×4): 325 mg via ORAL
  Filled 2014-06-20 (×5): qty 1

## 2014-06-20 MED ORDER — HEPARIN SODIUM (PORCINE) 5000 UNIT/ML IJ SOLN
5000.0000 [IU] | Freq: Three times a day (TID) | INTRAMUSCULAR | Status: DC
  Administered 2014-06-20 – 2014-06-22 (×5): 5000 [IU] via SUBCUTANEOUS
  Filled 2014-06-20 (×5): qty 1

## 2014-06-20 MED ORDER — AMLODIPINE BESYLATE 5 MG PO TABS
5.0000 mg | ORAL_TABLET | Freq: Every day | ORAL | Status: DC
  Administered 2014-06-20 – 2014-06-22 (×3): 5 mg via ORAL
  Filled 2014-06-20 (×3): qty 1

## 2014-06-20 MED ORDER — PANTOPRAZOLE SODIUM 40 MG PO TBEC
40.0000 mg | DELAYED_RELEASE_TABLET | Freq: Every day | ORAL | Status: DC
  Administered 2014-06-20 – 2014-06-22 (×3): 40 mg via ORAL
  Filled 2014-06-20 (×3): qty 1

## 2014-06-20 MED ORDER — INSULIN ASPART 100 UNIT/ML ~~LOC~~ SOLN
0.0000 [IU] | Freq: Three times a day (TID) | SUBCUTANEOUS | Status: DC
  Administered 2014-06-20: 0 [IU] via SUBCUTANEOUS
  Administered 2014-06-21: 2 [IU] via SUBCUTANEOUS
  Administered 2014-06-21 (×2): 3 [IU] via SUBCUTANEOUS

## 2014-06-20 MED ORDER — PANTOPRAZOLE SODIUM 40 MG PO TBEC: 40.0000 mg | DELAYED_RELEASE_TABLET | Freq: Every day | ORAL | Status: DC

## 2014-06-20 MED ORDER — GEMFIBROZIL 600 MG PO TABS
600.0000 mg | ORAL_TABLET | Freq: Two times a day (BID) | ORAL | Status: DC
  Administered 2014-06-20 – 2014-06-22 (×4): 600 mg via ORAL
  Filled 2014-06-20 (×5): qty 1

## 2014-06-20 MED ORDER — ONDANSETRON HCL 4 MG/2ML IJ SOLN
4.0000 mg | Freq: Once | INTRAMUSCULAR | Status: AC
  Administered 2014-06-20: 4 mg via INTRAVENOUS
  Filled 2014-06-20: qty 2

## 2014-06-20 MED ORDER — SODIUM CHLORIDE 0.9 % IV BOLUS (SEPSIS)
500.0000 mL | Freq: Once | INTRAVENOUS | Status: AC
  Administered 2014-06-20: 500 mL via INTRAVENOUS

## 2014-06-20 MED ORDER — MORPHINE SULFATE 4 MG/ML IJ SOLN
4.0000 mg | Freq: Once | INTRAMUSCULAR | Status: AC
  Administered 2014-06-20: 4 mg via INTRAVENOUS
  Filled 2014-06-20: qty 1

## 2014-06-20 MED ORDER — LOSARTAN POTASSIUM 50 MG PO TABS
100.0000 mg | ORAL_TABLET | Freq: Every day | ORAL | Status: DC
  Administered 2014-06-20 – 2014-06-22 (×3): 100 mg via ORAL
  Filled 2014-06-20 (×3): qty 2

## 2014-06-20 MED ORDER — ASPIRIN EC 81 MG PO TBEC
81.0000 mg | DELAYED_RELEASE_TABLET | Freq: Every day | ORAL | Status: DC
  Administered 2014-06-20 – 2014-06-22 (×3): 81 mg via ORAL
  Filled 2014-06-20 (×3): qty 1

## 2014-06-20 MED ORDER — SODIUM CHLORIDE 0.9 % IV SOLN
Freq: Once | INTRAVENOUS | Status: AC
  Administered 2014-06-20: 1000 mL via INTRAVENOUS

## 2014-06-20 MED ORDER — CLOPIDOGREL BISULFATE 75 MG PO TABS
75.0000 mg | ORAL_TABLET | Freq: Every day | ORAL | Status: DC
  Administered 2014-06-20 – 2014-06-22 (×3): 75 mg via ORAL
  Filled 2014-06-20 (×3): qty 1

## 2014-06-20 MED ORDER — MORPHINE SULFATE 2 MG/ML IJ SOLN
2.0000 mg | Freq: Four times a day (QID) | INTRAMUSCULAR | Status: DC | PRN
  Administered 2014-06-20: 2 mg via INTRAVENOUS
  Filled 2014-06-20: qty 1

## 2014-06-20 MED ORDER — SODIUM CHLORIDE 0.9 % IV SOLN
INTRAVENOUS | Status: DC
  Administered 2014-06-20 – 2014-06-21 (×2): via INTRAVENOUS

## 2014-06-20 MED ORDER — ROSUVASTATIN CALCIUM 10 MG PO TABS
10.0000 mg | ORAL_TABLET | Freq: Every day | ORAL | Status: DC
  Administered 2014-06-20 – 2014-06-22 (×3): 10 mg via ORAL
  Filled 2014-06-20 (×3): qty 1

## 2014-06-20 MED ORDER — ACETAMINOPHEN 325 MG PO TABS
650.0000 mg | ORAL_TABLET | Freq: Once | ORAL | Status: AC
  Administered 2014-06-20: 650 mg via ORAL
  Filled 2014-06-20: qty 2

## 2014-06-20 MED ORDER — ONDANSETRON HCL 4 MG PO TABS
4.0000 mg | ORAL_TABLET | Freq: Four times a day (QID) | ORAL | Status: DC
  Administered 2014-06-20 – 2014-06-22 (×7): 4 mg via ORAL
  Filled 2014-06-20 (×7): qty 1

## 2014-06-20 MED ORDER — INSULIN GLARGINE 100 UNIT/ML ~~LOC~~ SOLN
60.0000 [IU] | Freq: Two times a day (BID) | SUBCUTANEOUS | Status: DC
  Administered 2014-06-21 – 2014-06-22 (×3): 60 [IU] via SUBCUTANEOUS
  Filled 2014-06-20 (×4): qty 0.6

## 2014-06-20 NOTE — ED Notes (Signed)
Pt declined chicken and beef broth. Saltine crackers and apple juice given

## 2014-06-20 NOTE — ED Provider Notes (Signed)
CSN: [id number]     Arrival date & time 06/20/14  0808 History   First MD Initiated Contact with Patient 06/20/14 [phone]     Chief Complaint  Patient presents with  . Abdominal Pain  . Nausea  . Emesis     (Consider location/radiation/quality/duration/timing/severity/associated sxs/prior Treatment) HPI Michelle Holmes is a 64 y.o. female with hx of colon ca with resection "long time ago", htn, DM, COPD, CAD, CHF, presents to ED with compaint of nausea, vomiting, weakness. Pt states she has had a lot of stressors recently. States she just buried her sister yesterday. States sister passed away 2 days ago notes that her symptoms began. She states she hasn't been able to keep anything down in 2 days. She states she has persistent nausea vomiting independent of eating. She is unable to keep down her regular medications as well. She states she is having some lower abdominal pain as well. She states this pain radiates all over her abdomen. She denies any diarrhea. Unsure when her last bowel movement was. She denies any fever or chills. She denies any blood in her stools or emesis. She denies anyone else with the same symptoms. She has not tried any medications prior to coming in.  Past Medical History  Diagnosis Date  . Emphysema   . Colon cancer   . Hypertension   . Hypercholesteremia   . Diabetes mellitus   . COPD (chronic obstructive pulmonary disease)   . Coronary artery disease   . Obesity   . Sleep apnea   . TIA (transient ischemic attack)   . CHF (congestive heart failure)   . Anemia   . DJD (degenerative joint disease)   . Renal insufficiency   . Chronic back pain   . Scoliosis   . Obesity hypoventilation syndrome   . Stroke   . Myocardial infarction    Past Surgical History  Procedure Laterality Date  . Coronary angioplasty with stent placement    . Colon surgery     No family history on file. History  Substance Use Topics  . Smoking status: Former Research scientist (life sciences)  . Smokeless  tobacco: Not on file  . Alcohol Use: No   OB History    No data available     Review of Systems  Constitutional: Negative for fever and chills.  Respiratory: Negative for cough, chest tightness and shortness of breath.   Cardiovascular: Negative for chest pain, palpitations and leg swelling.  Gastrointestinal: Positive for nausea, vomiting and abdominal pain. Negative for diarrhea, constipation, blood in stool and rectal pain.  Genitourinary: Negative for dysuria, flank pain, vaginal bleeding, vaginal discharge, vaginal pain and pelvic pain.  Musculoskeletal: Negative for myalgias, arthralgias, neck pain and neck stiffness.  Skin: Negative for rash.  Neurological: Positive for weakness. Negative for dizziness and headaches.  All other systems reviewed and are negative.     Allergies  Penicillins  Home Medications   Prior to Admission medications   Medication Sig Start Date End Date Taking? Authorizing Provider  alprazolam Duanne Moron) 2 MG tablet Take 2 tablets by mouth at bedtime. 05/28/14  Yes Historical Provider, MD  clopidogrel (PLAVIX) 75 MG tablet Take 75 mg by mouth daily.   Yes Historical Provider, MD  gemfibrozil (LOPID) 600 MG tablet Take 600 mg by mouth 2 (two) times daily before a meal.   Yes Historical Provider, MD  losartan (COZAAR) 100 MG tablet Take 100 mg by mouth daily.  12/01/10  Yes Historical Provider, MD  LYRICA 50 MG  capsule Take 50 mg by mouth 2 (two) times daily. 06/04/14  Yes Historical Provider, MD  metoprolol succinate (TOPROL-XL) 100 MG 24 hr tablet Take 100 mg by mouth daily. Take with or immediately following a meal.   Yes Historical Provider, MD  ondansetron (ZOFRAN) 4 MG tablet Take 1 tablet (4 mg total) by mouth every 6 (six) hours. 02/10/14  Yes Blanchie Dessert, MD  oxyCODONE-acetaminophen (PERCOCET/ROXICET) 5-325 MG per tablet Take 1-2 tablets by mouth every 6 (six) hours as needed for severe pain. 02/10/14  Yes Blanchie Dessert, MD  pantoprazole  (PROTONIX) 40 MG tablet Take 40 mg by mouth daily. 06/04/14  Yes Historical Provider, MD  rosuvastatin (CRESTOR) 10 MG tablet Take 10 mg by mouth daily.  12/01/10  Yes Historical Provider, MD  amLODipine (NORVASC) 5 MG tablet Take 1 tablet (5 mg total) by mouth daily. 12/15/10 02/10/14  Charolette Forward, MD  ciprofloxacin (CIPRO) 500 MG tablet Take 1 tablet (500 mg total) by mouth 2 (two) times daily. Patient not taking: Reported on 06/20/2014 02/10/12   Charolette Forward, MD  ferrous sulfate 325 (65 FE) MG tablet Take 1 tablet (325 mg total) by mouth 2 (two) times daily with a meal. 12/15/10 02/10/14  Charolette Forward, MD  fluticasone (CUTIVATE) 0.005 % ointment Apply 1 application topically 2 (two) times daily. Patient not taking: Reported on 06/20/2014 08/06/13   Billy Fischer, MD  insulin glargine (LANTUS) 100 UNIT/ML injection Inject 45 Units into the skin 2 (two) times daily. Patient taking differently: Inject 60 Units into the skin 2 (two) times daily.  02/10/12 02/10/14  Charolette Forward, MD  pantoprazole (PROTONIX) 40 MG tablet Take 1 tablet (40 mg total) by mouth daily at 6 (six) AM. 12/15/10 02/10/14  Charolette Forward, MD   BP 164/76 mmHg  Pulse 95  Temp(Src) 100.5 F (38.1 C) (Oral)  Resp 15  Ht 5\' 5"  (1.651 m)  Wt 231 lb (104.781 kg)  BMI 38.44 kg/m2  SpO2 93% Physical Exam  Constitutional: She is oriented to person, place, and time. She appears well-developed and well-nourished. No distress.  HENT:  Head: Normocephalic.  Mouth/Throat: Oropharynx is clear and moist.  Eyes: Conjunctivae are normal.  Neck: Neck supple.  Cardiovascular: Normal rate, regular rhythm and normal heart sounds.   Pulmonary/Chest: Effort normal and breath sounds normal. No respiratory distress. She has no wheezes. She has no rales.  Abdominal: Soft. Bowel sounds are normal. She exhibits no distension. There is tenderness. There is no rebound.  Diffuse tenderness, worse in the lower abdomen. No guarding. No rebound tenderness.   Musculoskeletal: She exhibits no edema.  Neurological: She is alert and oriented to person, place, and time.  Skin: Skin is warm and dry.  Psychiatric: She has a normal mood and affect. Her behavior is normal.  Nursing note and vitals reviewed.   ED Course  Procedures (including critical care time) Labs Review Labs Reviewed  CBC WITH DIFFERENTIAL/PLATELET - Abnormal; Notable for the following:    RBC 3.04 (*)    Hemoglobin 9.5 (*)    HCT 28.4 (*)    Platelets 126 (*)    Neutrophils Relative % 80 (*)    Lymphocytes Relative 10 (*)    All other components within normal limits  COMPREHENSIVE METABOLIC PANEL - Abnormal; Notable for the following:    CO2 18 (*)    Glucose, Bld 236 (*)    BUN 43 (*)    Creatinine, Ser 3.58 (*)    Calcium 8.8 (*)  Albumin 3.4 (*)    GFR calc non Af Amer 13 (*)    GFR calc Af Amer 15 (*)    All other components within normal limits  URINALYSIS, ROUTINE W REFLEX MICROSCOPIC - Abnormal; Notable for the following:    APPearance CLOUDY (*)    Glucose, UA 100 (*)    Hgb urine dipstick SMALL (*)    Protein, ur 100 (*)    Leukocytes, UA TRACE (*)    All other components within normal limits  URINE MICROSCOPIC-ADD ON - Abnormal; Notable for the following:    Squamous Epithelial / LPF FEW (*)    Bacteria, UA MANY (*)    All other components within normal limits  URINE CULTURE  LIPASE, BLOOD  CBC  COMPREHENSIVE METABOLIC PANEL  CBC WITH DIFFERENTIAL/PLATELET  HEMOGLOBIN A1C  AMYLASE  LIPASE, BLOOD  I-STAT TROPOININ, ED  I-STAT CG4 LACTIC ACID, ED  I-STAT CG4 LACTIC ACID, ED    Imaging Review Dg Abd Acute W/chest  06/20/2014   CLINICAL DATA:  Nausea, vomiting and mid lower abdominal pain for 2 days.  EXAM: DG ABDOMEN ACUTE W/ 1V CHEST  COMPARISON:  CT abdomen pelvis 02/10/2014 and chest radiograph 02/07/2012.  FINDINGS: Frontal view of the chest shows midline trachea. Heart is at the upper limits of normal in size to mildly enlarged, stable.  Right hilar prominence is unchanged from 02/07/2012. Lungs are low in volume with probable scattered linear scarring in the left perihilar region and both lung bases. No definite pleural fluid.  Supine and left lateral decubitus views of the abdomen show slight paucity of gas. No definite small bowel dilatation. No free air. Image quality is degraded by body habitus. Scoliosis.  IMPRESSION: 1. Slight paucity of gas in the abdomen which can be seen in setting of vomiting. 2. No acute findings in the chest.   Electronically Signed   By: Lorin Picket M.D.   On: 06/20/2014 10:44     EKG Interpretation   Date/Time:  Wednesday Jun 20 2014 08:52:01 EDT Ventricular Rate:  93 PR Interval:  173 QRS Duration: 83 QT Interval:  363 QTC Calculation: 451 R Axis:   27 Text Interpretation:  Sinus rhythm No significant change was found  Confirmed by CAMPOS  MD, KEVIN ([id number]) on 06/20/2014 8:58:09 AM      MDM   Final diagnoses:  Abdominal pain    patient in emergency department with nausea and vomiting, generalized abdominal pain for 2 days. She states she feels weak and dizzy. Will get labs, urinalysis, will start slow hydration given history of CHF, Zofran and morphine ordered. Patient was noted to have a temperature of 100.5 orally. Will try some Tylenol.  Pt feeling better. Creatinine 3.58. Last one was 4.9 1 month ago.  Discussed with Dr. Terrence Dupont, baseline creatinine 2.2? Possibly dehydration? Will need further work up. Will admit. Fluids administered in ED>   Filed Vitals:   06/20/14 0826 06/20/14 1130 06/20/14 1258 06/20/14 1329  BP:  160/79 148/82 160/89  Pulse: 95 89 82 87  Temp:   98.4 F (36.9 C) 98.7 F (37.1 C)  TempSrc:   Oral Oral  Resp:  21 22 21   Height:      Weight:      SpO2:  95% 100% 97%     Jeannett Senior, PA-C 06/20/14 [place], MD 06/21/14 [phone]

## 2014-06-20 NOTE — ED Notes (Addendum)
[name] at bedside. SISTER DIED 2 DAYS AGO AND WAS BURIED YESTERDAY. PT DID NOT EAT AT [facility]. PT HAS BEEN UNABLE TO EAT SINCE THEN. N/V X 2 DAYS WITHOUT DIARRHEA. PT IS UNABLE TO URINATE AT THIS TIME. DENIES URINARY SYMPTOMS. C/O OF GENERLIZED ABDOMINAL PAIN. DENIES CP. FEVER NOTED.

## 2014-06-20 NOTE — Progress Notes (Signed)
Paged MD Terrence Dupont regarding patient's with no prns for pain or nausea, pt stating she is having abdominal pain and she is currently actively vomiting,  awaiting callback for MD [name] RN 6:45 PM 06-20-2014

## 2014-06-20 NOTE — ED Notes (Signed)
Per EMS pt coming from home with c/o n/v/ abdominal pain x 2 days. Denies diarrhea or fever.

## 2014-06-20 NOTE — H&P (Signed)
Michelle Holmes is an 64 y.o. female.   Chief Complaint: Vague abdominal pain associated with nausea and vomiting for the last 2 days HPI: Patient is 64 year old female with past medical history significant for coronary artery disease history of non-Q-wave myocardial infarction in the past status post PCI to LAD in the past, hypertension, insulin-requiring diabetes mellitus, morbid obesity, obstructive sleep apnea/obesity hypoventilation syndrome, degenerative joint disease, scoliosis, chronic kidney disease stage IV, anemia of chronic disease, history of nephrolithiasis, came to the ER complaining of vague abdominal pain associated with nausea and vomiting for last 2 days and was noted to have worsening renal function. Patient denies any chest pain or shortness of breath. Denies diarrhea. Denies fever but complaints chills and was noted to have low-grade fever. Patient denies any cough or sore throat denies any urinary complaints.  Past Medical History  Diagnosis Date  . Emphysema   . Colon cancer   . Hypertension   . Hypercholesteremia   . Diabetes mellitus   . COPD (chronic obstructive pulmonary disease)   . Coronary artery disease   . Obesity   . Sleep apnea   . TIA (transient ischemic attack)   . CHF (congestive heart failure)   . Anemia   . DJD (degenerative joint disease)   . Renal insufficiency   . Chronic back pain   . Scoliosis   . Obesity hypoventilation syndrome   . Stroke   . Myocardial infarction     Past Surgical History  Procedure Laterality Date  . Coronary angioplasty with stent placement    . Colon surgery      No family history on file. Social History:  reports that she has quit smoking. She does not have any smokeless tobacco history on file. She reports that she does not drink alcohol or use illicit drugs.  Allergies:  Allergies  Allergen Reactions  . Penicillins Hives     (Not in a hospital admission)  Results for orders placed or performed during the  hospital encounter of 06/20/14 (from the past 48 hour(s))  Urinalysis, Routine w reflex microscopic     Status: Abnormal   Collection Time: 06/20/14  8:08 AM  Result Value Ref Range   Color, Urine YELLOW YELLOW   APPearance CLOUDY (A) CLEAR   Specific Gravity, Urine 1.018 1.005 - 1.030   pH 5.5 5.0 - 8.0   Glucose, UA 100 (A) NEGATIVE mg/dL   Hgb urine dipstick SMALL (A) NEGATIVE   Bilirubin Urine NEGATIVE NEGATIVE   Ketones, ur NEGATIVE NEGATIVE mg/dL   Protein, ur 100 (A) NEGATIVE mg/dL   Urobilinogen, UA 0.2 0.0 - 1.0 mg/dL   Nitrite NEGATIVE NEGATIVE   Leukocytes, UA TRACE (A) NEGATIVE  Urine microscopic-add on     Status: Abnormal   Collection Time: 06/20/14  8:08 AM  Result Value Ref Range   Squamous Epithelial / LPF FEW (A) RARE   WBC, UA 7-10 <3 WBC/hpf   RBC / HPF 0-2 <3 RBC/hpf   Bacteria, UA MANY (A) RARE   Urine-Other MUCOUS PRESENT   CBC with Differential     Status: Abnormal   Collection Time: 06/20/14  8:38 AM  Result Value Ref Range   WBC 7.0 4.0 - 10.5 K/uL   RBC 3.04 (L) 3.87 - 5.11 MIL/uL   Hemoglobin 9.5 (L) 12.0 - 15.0 g/dL   HCT 28.4 (L) 36.0 - 46.0 %   MCV 93.4 78.0 - 100.0 fL   MCH 31.3 26.0 - 34.0 pg   MCHC  33.5 30.0 - 36.0 g/dL   RDW 14.1 11.5 - 15.5 %   Platelets 126 (L) 150 - 400 K/uL   Neutrophils Relative % 80 (H) 43 - 77 %   Neutro Abs 5.6 1.7 - 7.7 K/uL   Lymphocytes Relative 10 (L) 12 - 46 %   Lymphs Abs 0.7 0.7 - 4.0 K/uL   Monocytes Relative 9 3 - 12 %   Monocytes Absolute 0.6 0.1 - 1.0 K/uL   Eosinophils Relative 1 0 - 5 %   Eosinophils Absolute 0.1 0.0 - 0.7 K/uL   Basophils Relative 0 0 - 1 %   Basophils Absolute 0.0 0.0 - 0.1 K/uL  Comprehensive metabolic panel     Status: Abnormal   Collection Time: 06/20/14  8:38 AM  Result Value Ref Range   Sodium 139 135 - 145 mmol/L   Potassium 4.1 3.5 - 5.1 mmol/L   Chloride 111 101 - 111 mmol/L   CO2 18 (L) 22 - 32 mmol/L   Glucose, Bld 236 (H) 65 - 99 mg/dL   BUN 43 (H) 6 - 20 mg/dL    Creatinine, Ser 3.58 (H) 0.44 - 1.00 mg/dL   Calcium 8.8 (L) 8.9 - 10.3 mg/dL   Total Protein 7.5 6.5 - 8.1 g/dL   Albumin 3.4 (L) 3.5 - 5.0 g/dL   AST 18 15 - 41 U/L   ALT 18 14 - 54 U/L   Alkaline Phosphatase 105 38 - 126 U/L   Total Bilirubin 0.8 0.3 - 1.2 mg/dL   GFR calc non Af Amer 13 (L) >60 mL/min   GFR calc Af Amer 15 (L) >60 mL/min    Comment: (NOTE) The eGFR has been calculated using the CKD EPI equation. This calculation has not been validated in all clinical situations. eGFR's persistently <60 mL/min signify possible Chronic Kidney Disease.    Anion gap 10 5 - 15  Lipase, blood     Status: None   Collection Time: 06/20/14  8:38 AM  Result Value Ref Range   Lipase 24 22 - 51 U/L  I-Stat Troponin, ED (not at [facility])     Status: None   Collection Time: 06/20/14  8:50 AM  Result Value Ref Range   Troponin i, poc 0.01 0.00 - 0.08 ng/mL   Comment 3            Comment: Due to the release kinetics of cTnI, a negative result within the first hours of the onset of symptoms does not rule out myocardial infarction with certainty. If myocardial infarction is still suspected, repeat the test at appropriate intervals.   I-Stat CG4 Lactic Acid, ED     Status: None   Collection Time: 06/20/14  8:52 AM  Result Value Ref Range   Lactic Acid, Venous 0.79 0.5 - 2.0 mmol/L   Dg Abd Acute W/chest  06/20/2014   CLINICAL DATA:  Nausea, vomiting and mid lower abdominal pain for 2 days.  EXAM: DG ABDOMEN ACUTE W/ 1V CHEST  COMPARISON:  CT abdomen pelvis 02/10/2014 and chest radiograph 02/07/2012.  FINDINGS: Frontal view of the chest shows midline trachea. Heart is at the upper limits of normal in size to mildly enlarged, stable. Right hilar prominence is unchanged from 02/07/2012. Lungs are low in volume with probable scattered linear scarring in the left perihilar region and both lung bases. No definite pleural fluid.  Supine and left lateral decubitus views of the abdomen show slight  paucity of gas. No definite small bowel dilatation.  No free air. Image quality is degraded by body habitus. Scoliosis.  IMPRESSION: 1. Slight paucity of gas in the abdomen which can be seen in setting of vomiting. 2. No acute findings in the chest.   Electronically Signed   By: Lorin Picket M.D.   On: 06/20/2014 10:44    Review of Systems  Constitutional: Positive for chills.  HENT: Negative for sore throat.   Eyes: Negative for double vision.  Respiratory: Negative for cough, hemoptysis and sputum production.   Cardiovascular: Negative for chest pain, palpitations, orthopnea, claudication and leg swelling.  Gastrointestinal: Positive for nausea, vomiting and abdominal pain. Negative for diarrhea.  Genitourinary: Negative for dysuria and urgency.  Neurological: Negative for dizziness.    Blood pressure 164/76, pulse 95, temperature 100.5 F (38.1 C), temperature source Oral, resp. rate 15, height 5' 5"  (1.651 m), weight 104.781 kg (231 lb), SpO2 93 %. Physical Exam  Constitutional: She is oriented to person, place, and time.  HENT:  Head: Normocephalic and atraumatic.  Eyes: Conjunctivae are normal. Pupils are equal, round, and reactive to light. Left eye exhibits no discharge. No scleral icterus.  Neck: Normal range of motion. Neck supple. No JVD present. No tracheal deviation present. No thyromegaly present.  Cardiovascular: Normal rate and regular rhythm.   Murmur (Soft systolic murmur and S4 gallop noted) heard. Respiratory: Effort normal and breath sounds normal. No respiratory distress. She has no wheezes. She has no rales.  GI: Soft. Bowel sounds are normal. She exhibits distension. There is no tenderness. There is no rebound.  Musculoskeletal: She exhibits no edema or tenderness.  Neurological: She is alert and oriented to person, place, and time.     Assessment/Plan Abdominal painis with nausea vomiting rule out acute pancreatitis/rule out gallstones Acute on chronic  kidney injury stage IV Dehydration Coronary artery disease history of non-Q-wave MI in the past Hypertension Diabetes mellitus Hypercholesteremia Anemia of chronic disease Morbid obesity Obstructive sleep apnea/obesity hypoventilation syndrome History of Nephro lithiasis Plan As per orders  Charolette Forward 06/20/2014, 12:02 PM

## 2014-06-20 NOTE — ED Notes (Signed)
Bed: [id number] Expected date:  Expected time:  Means of arrival:  Comments: EMS-n/v

## 2014-06-20 NOTE — ED Notes (Addendum)
Michelle Holmes [phone]

## 2014-06-20 NOTE — ED Notes (Signed)
MD at bedside. 

## 2014-06-20 NOTE — Progress Notes (Signed)
Telephone order received from MD [facility] for to make patient NPO due to nausea and vomiting, hold lantus for tonight and place order for morphine 2 mg every 6 hours as needed for pain, orders readback and entered [name] RN 6:53 PM 06-20-2014

## 2014-06-21 DIAGNOSIS — Z880 Allergy status to penicillin: Secondary | ICD-10-CM | POA: Diagnosis not present

## 2014-06-21 DIAGNOSIS — E662 Morbid (severe) obesity with alveolar hypoventilation: Secondary | ICD-10-CM | POA: Diagnosis present

## 2014-06-21 DIAGNOSIS — Z87891 Personal history of nicotine dependence: Secondary | ICD-10-CM | POA: Diagnosis not present

## 2014-06-21 DIAGNOSIS — J449 Chronic obstructive pulmonary disease, unspecified: Secondary | ICD-10-CM | POA: Diagnosis present

## 2014-06-21 DIAGNOSIS — E780 Pure hypercholesterolemia: Secondary | ICD-10-CM | POA: Diagnosis present

## 2014-06-21 DIAGNOSIS — A084 Viral intestinal infection, unspecified: Secondary | ICD-10-CM | POA: Diagnosis present

## 2014-06-21 DIAGNOSIS — M1990 Unspecified osteoarthritis, unspecified site: Secondary | ICD-10-CM | POA: Diagnosis present

## 2014-06-21 DIAGNOSIS — N184 Chronic kidney disease, stage 4 (severe): Secondary | ICD-10-CM | POA: Diagnosis present

## 2014-06-21 DIAGNOSIS — D638 Anemia in other chronic diseases classified elsewhere: Secondary | ICD-10-CM | POA: Diagnosis present

## 2014-06-21 DIAGNOSIS — Z6838 Body mass index (BMI) 38.0-38.9, adult: Secondary | ICD-10-CM | POA: Diagnosis not present

## 2014-06-21 DIAGNOSIS — I252 Old myocardial infarction: Secondary | ICD-10-CM | POA: Diagnosis not present

## 2014-06-21 DIAGNOSIS — I509 Heart failure, unspecified: Secondary | ICD-10-CM | POA: Diagnosis present

## 2014-06-21 DIAGNOSIS — E860 Dehydration: Secondary | ICD-10-CM | POA: Diagnosis present

## 2014-06-21 DIAGNOSIS — R112 Nausea with vomiting, unspecified: Secondary | ICD-10-CM | POA: Diagnosis present

## 2014-06-21 DIAGNOSIS — Z955 Presence of coronary angioplasty implant and graft: Secondary | ICD-10-CM | POA: Diagnosis not present

## 2014-06-21 DIAGNOSIS — Z8673 Personal history of transient ischemic attack (TIA), and cerebral infarction without residual deficits: Secondary | ICD-10-CM | POA: Diagnosis not present

## 2014-06-21 DIAGNOSIS — E119 Type 2 diabetes mellitus without complications: Secondary | ICD-10-CM | POA: Diagnosis present

## 2014-06-21 DIAGNOSIS — N179 Acute kidney failure, unspecified: Secondary | ICD-10-CM | POA: Diagnosis present

## 2014-06-21 DIAGNOSIS — Z85038 Personal history of other malignant neoplasm of large intestine: Secondary | ICD-10-CM | POA: Diagnosis not present

## 2014-06-21 DIAGNOSIS — Z87442 Personal history of urinary calculi: Secondary | ICD-10-CM | POA: Diagnosis not present

## 2014-06-21 DIAGNOSIS — I2510 Atherosclerotic heart disease of native coronary artery without angina pectoris: Secondary | ICD-10-CM | POA: Diagnosis present

## 2014-06-21 DIAGNOSIS — R109 Unspecified abdominal pain: Secondary | ICD-10-CM | POA: Diagnosis not present

## 2014-06-21 DIAGNOSIS — I129 Hypertensive chronic kidney disease with stage 1 through stage 4 chronic kidney disease, or unspecified chronic kidney disease: Secondary | ICD-10-CM | POA: Diagnosis present

## 2014-06-21 DIAGNOSIS — Z794 Long term (current) use of insulin: Secondary | ICD-10-CM | POA: Diagnosis not present

## 2014-06-21 LAB — CBC
HCT: 25.9 % — ABNORMAL LOW (ref 36.0–46.0)
Hemoglobin: 8.7 g/dL — ABNORMAL LOW (ref 12.0–15.0)
MCH: 31.5 pg (ref 26.0–34.0)
MCHC: 33.6 g/dL (ref 30.0–36.0)
MCV: 93.8 fL (ref 78.0–100.0)
Platelets: 121 10*3/uL — ABNORMAL LOW (ref 150–400)
RBC: 2.76 MIL/uL — ABNORMAL LOW (ref 3.87–5.11)
RDW: 14 % (ref 11.5–15.5)
WBC: 5.8 10*3/uL (ref 4.0–10.5)

## 2014-06-21 LAB — BASIC METABOLIC PANEL
Anion gap: 9 (ref 5–15)
BUN: 43 mg/dL — ABNORMAL HIGH (ref 6–20)
CO2: 19 mmol/L — ABNORMAL LOW (ref 22–32)
Calcium: 8.2 mg/dL — ABNORMAL LOW (ref 8.9–10.3)
Chloride: 112 mmol/L — ABNORMAL HIGH (ref 101–111)
Creatinine, Ser: 3.55 mg/dL — ABNORMAL HIGH (ref 0.44–1.00)
GFR calc Af Amer: 15 mL/min — ABNORMAL LOW (ref >60)
GFR calc non Af Amer: 13 mL/min — ABNORMAL LOW (ref >60)
Glucose, Bld: 250 mg/dL — ABNORMAL HIGH (ref 65–99)
Potassium: 4.5 mmol/L (ref 3.5–5.1)
Sodium: 140 mmol/L (ref 135–145)

## 2014-06-21 LAB — GLUCOSE, CAPILLARY
Glucose-Capillary: 211 mg/dL — ABNORMAL HIGH (ref 65–99)
Glucose-Capillary: 220 mg/dL — ABNORMAL HIGH (ref 65–99)
Glucose-Capillary: 159 mg/dL — ABNORMAL HIGH (ref 65–99)
Glucose-Capillary: 188 mg/dL — ABNORMAL HIGH (ref 65–99)

## 2014-06-21 LAB — URINE CULTURE

## 2014-06-21 LAB — OCCULT BLOOD X 1 CARD TO LAB, STOOL: Fecal Occult Bld: POSITIVE — AB

## 2014-06-21 NOTE — Progress Notes (Signed)
Michelle Holmes is still grieving her sister who died of cancer. The sister did not tell Michelle Holmes that she was ill, or that she had been diagnosed with stage 4 cancer. The shock of hearing of her sister's death is a contributing factor to her current hospital stay. Grief counsel and prayer given to assist in dealing with her spiritual and emotional pain.  Michelle Holmes requests follow up visits by spiritual care team members to further assist in her grieving process.  Michelle Holmes. Michelle Holmes, [place]

## 2014-06-21 NOTE — Progress Notes (Signed)
Inpatient Diabetes Program Recommendations  AACE/ADA: New Consensus Statement on Inpatient Glycemic Control (2013)  Target Ranges:  Prepandial:   less than 140 mg/dL      Peak postprandial:   less than 180 mg/dL (1-2 hours)      Critically ill patients:  140 - 180 mg/dL  Results for Michelle Holmes, Michelle Holmes (MRN [id number]) as of 06/21/2014 09:17  Ref. Range 06/20/2014 14:29 06/20/2014 17:10 06/20/2014 22:01 06/21/2014 08:04  Glucose-Capillary Latest Ref Range: 65-99 mg/dL 302 (H) 264 (H) 200 (H) 211 (H)   Inpatient Diabetes Program Recommendations Correction (SSI): change to Q4 if NPO  Note: spoke with RN concerning above recommendation. She will notify MD.  Thank you  Raoul Pitch BSN, RN,CDE Inpatient Diabetes Coordinator [phone] (team pager)

## 2014-06-21 NOTE — Progress Notes (Signed)
Subjective:  Denies any further abdominal pain nausea vomiting. States her GI evaluation in the past including colonoscopy did not want any further GI evaluation. Creatinine still remains elevated states is passing urine better now  Objective:  Vital Signs in the last 24 hours: Temp:  [98.4 F (36.9 C)-103.1 F (39.5 C)] 98.6 F (37 C) (05/26 0335) Pulse Rate:  [71-89] 71 (05/26 0335) Resp:  [21-22] 21 (05/25 2202) BP: (148-160)/(73-89) 155/73 mmHg (05/26 0335) SpO2:  [95 %-100 %] 98 % (05/26 0335)  Intake/Output from previous day: 05/25 0701 - 05/26 0700 In: 2156.3 [I.V.:2156.3] Out: 950 [Urine:750; Emesis/NG output:200] Intake/Output from this shift: Total I/O In: -  Out: 300 [Urine:300]  Physical Exam: Neck: no adenopathy, no carotid bruit, no JVD and supple, symmetrical, trachea midline Lungs: clear to auscultation bilaterally Heart: regular rate and rhythm, S1, S2 normal and Soft systolic murmur noted Abdomen: soft, non-tender; bowel sounds normal; no masses,  no organomegaly Extremities: extremities normal, atraumatic, no cyanosis or edema  Lab Results:  Recent Labs  06/20/14 2040 06/21/14 0420  WBC 6.5 5.8  HGB 9.7* 8.7*  PLT 121* 121*    Recent Labs  06/20/14 2040 06/21/14 0420  NA 140 140  K 4.6 4.5  CL 111 112*  CO2 19* 19*  GLUCOSE 207* 250*  BUN 44* 43*  CREATININE 3.41* 3.55*   No results for input(s): TROPONINI in the last 72 hours.  Invalid input(s): CK, MB Hepatic Function Panel  Recent Labs  06/20/14 2040  PROT 7.5  ALBUMIN 3.3*  AST 20  ALT 20  ALKPHOS 142*  BILITOT 0.9   No results for input(s): CHOL in the last 72 hours. No results for input(s): PROTIME in the last 72 hours.  Imaging: Imaging results have been reviewed and US Abdomen Complete  06/20/2014   CLINICAL DATA:  Nausea, vomiting, and vague abdominal pain for 2 days.  EXAM: ULTRASOUND ABDOMEN COMPLETE  COMPARISON:  CT abdomen and pelvis 02/10/2014  FINDINGS:  Examination was partially limited due to patient body habitus and bowel gas.  Gallbladder: Surgically absent.  Common bile duct: Diameter: 5.6 mm  Liver: Inhomogeneous echotexture diffusely without focal abnormality identified.  IVC: No abnormality visualized.  Pancreas: Not well visualized. Pancreatic head grossly unremarkable.  Spleen: Size and appearance within normal limits.  Right Kidney: Length: 10.8 cm. No hydronephrosis. 1.6 cm echogenic focus with posterior acoustic shadowing in the lower pole suggestive of a calculus.  Left Kidney: Length: 12.2 cm.  Very mild hydronephrosis.  Abdominal aorta: No aneurysm visualized.  Other findings: None.  IMPRESSION: 1. Suboptimal examination as above. 2. Nonobstructing right lower pole renal calculus. 3. Very mild left hydronephrosis. 4. Prior cholecystectomy.   Electronically Signed   By: Logan Bores   On: 06/20/2014 21:22   Dg Abd Acute W/chest  06/20/2014   CLINICAL DATA:  Nausea, vomiting and mid lower abdominal pain for 2 days.  EXAM: DG ABDOMEN ACUTE W/ 1V CHEST  COMPARISON:  CT abdomen pelvis 02/10/2014 and chest radiograph 02/07/2012.  FINDINGS: Frontal view of the chest shows midline trachea. Heart is at the upper limits of normal in size to mildly enlarged, stable. Right hilar prominence is unchanged from 02/07/2012. Lungs are low in volume with probable scattered linear scarring in the left perihilar region and both lung bases. No definite pleural fluid.  Supine and left lateral decubitus views of the abdomen show slight paucity of gas. No definite small bowel dilatation. No free air. Image quality is degraded by  body habitus. Scoliosis.  IMPRESSION: 1. Slight paucity of gas in the abdomen which can be seen in setting of vomiting. 2. No acute findings in the chest.   Electronically Signed   By: Lorin Picket M.D.   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   Charolette Forward 06/21/2014, 11:00 AM

## 2014-06-22 LAB — CBC
HCT: 26.2 % — ABNORMAL LOW (ref 36.0–46.0)
Hemoglobin: 8.7 g/dL — ABNORMAL LOW (ref 12.0–15.0)
MCH: 31.1 pg (ref 26.0–34.0)
MCHC: 33.2 g/dL (ref 30.0–36.0)
MCV: 93.6 fL (ref 78.0–100.0)
Platelets: 140 10*3/uL — ABNORMAL LOW (ref 150–400)
RBC: 2.8 MIL/uL — ABNORMAL LOW (ref 3.87–5.11)
RDW: 14 % (ref 11.5–15.5)
WBC: 5.4 10*3/uL (ref 4.0–10.5)

## 2014-06-22 LAB — BASIC METABOLIC PANEL
Anion gap: 10 (ref 5–15)
BUN: 46 mg/dL — ABNORMAL HIGH (ref 6–20)
CO2: 16 mmol/L — ABNORMAL LOW (ref 22–32)
Calcium: 8.5 mg/dL — ABNORMAL LOW (ref 8.9–10.3)
Chloride: 114 mmol/L — ABNORMAL HIGH (ref 101–111)
Creatinine, Ser: 3.21 mg/dL — ABNORMAL HIGH (ref 0.44–1.00)
GFR calc Af Amer: 17 mL/min — ABNORMAL LOW (ref >60)
GFR calc non Af Amer: 14 mL/min — ABNORMAL LOW (ref >60)
Glucose, Bld: 121 mg/dL — ABNORMAL HIGH (ref 65–99)
Potassium: 4.2 mmol/L (ref 3.5–5.1)
Sodium: 140 mmol/L (ref 135–145)

## 2014-06-22 LAB — HEMOGLOBIN A1C
Hgb A1c MFr Bld: 11.9 % — ABNORMAL HIGH (ref 4.8–5.6)
Mean Plasma Glucose: 295 mg/dL

## 2014-06-22 LAB — GLUCOSE, CAPILLARY: Glucose-Capillary: 113 mg/dL — ABNORMAL HIGH (ref 65–99)

## 2014-06-22 MED ORDER — FERROUS SULFATE 325 (65 FE) MG PO TABS: 325.0000 mg | ORAL_TABLET | Freq: Two times a day (BID) | ORAL | Status: DC

## 2014-06-22 MED ORDER — INSULIN GLARGINE 100 UNIT/ML ~~LOC~~ SOLN: 60.0000 [IU] | Freq: Two times a day (BID) | SUBCUTANEOUS | Status: DC

## 2014-06-22 MED ORDER — ASPIRIN 81 MG PO TBEC: 81.0000 mg | DELAYED_RELEASE_TABLET | Freq: Every day | ORAL | Status: DC

## 2014-06-22 NOTE — Discharge Instructions (Signed)

## 2014-06-22 NOTE — Progress Notes (Signed)
Subjective:  Patient denies any chest pain or shortness of breath. Denies abdominal pain nausea or vomiting. Eager to go home. Do not want any GI or renal evaluation. States will follow-up as outpatient with GI had colonoscopy in the past. Renal function slowly improving.  Objective:  Vital Signs in the last 24 hours: Temp:  [98.3 F (36.8 C)-98.6 F (37 C)] 98.3 F (36.8 C) (05/27 0407) Pulse Rate:  [56-60] 56 (05/27 0407) Resp:  [20] 20 (05/27 0407) BP: (123-145)/(60-66) 145/66 mmHg (05/27 0407) SpO2:  [96 %-100 %] 100 % (05/27 0407)  Intake/Output from previous day: 05/26 0701 - 05/27 0700 In: 2401.3 [P.O.:600; I.V.:1801.3] Out: 300 [Urine:300] Intake/Output from this shift:    Physical Exam: Neck: no adenopathy, no carotid bruit, no JVD and supple, symmetrical, trachea midline Lungs: clear to auscultation bilaterally Heart: regular rate and rhythm, S1, S2 normal and Soft systolic murmur noted Abdomen: soft, non-tender; bowel sounds normal; no masses,  no organomegaly Extremities: extremities normal, atraumatic, no cyanosis or edema  Lab Results:  Recent Labs  06/21/14 0420 06/22/14 0435  WBC 5.8 5.4  HGB 8.7* 8.7*  PLT 121* 140*    Recent Labs  06/21/14 0420 06/22/14 0435  NA 140 140  K 4.5 4.2  CL 112* 114*  CO2 19* 16*  GLUCOSE 250* 121*  BUN 43* 46*  CREATININE 3.55* 3.21*   No results for input(s): TROPONINI in the last 72 hours.  Invalid input(s): CK, MB Hepatic Function Panel  Recent Labs  06/20/14 2040  PROT 7.5  ALBUMIN 3.3*  AST 20  ALT 20  ALKPHOS 142*  BILITOT 0.9   No results for input(s): CHOL in the last 72 hours. No results for input(s): PROTIME in the last 72 hours.  Imaging: Imaging results have been reviewed and US Abdomen Complete  06/20/2014   CLINICAL DATA:  Nausea, vomiting, and vague abdominal pain for 2 days.  EXAM: ULTRASOUND ABDOMEN COMPLETE  COMPARISON:  CT abdomen and pelvis 02/10/2014  FINDINGS: Examination was  partially limited due to patient body habitus and bowel gas.  Gallbladder: Surgically absent.  Common bile duct: Diameter: 5.6 mm  Liver: Inhomogeneous echotexture diffusely without focal abnormality identified.  IVC: No abnormality visualized.  Pancreas: Not well visualized. Pancreatic head grossly unremarkable.  Spleen: Size and appearance within normal limits.  Right Kidney: Length: 10.8 cm. No hydronephrosis. 1.6 cm echogenic focus with posterior acoustic shadowing in the lower pole suggestive of a calculus.  Left Kidney: Length: 12.2 cm.  Very mild hydronephrosis.  Abdominal aorta: No aneurysm visualized.  Other findings: None.  IMPRESSION: 1. Suboptimal examination as above. 2. Nonobstructing right lower pole renal calculus. 3. Very mild left hydronephrosis. 4. Prior cholecystectomy.   Electronically Signed   By: Logan Bores   On: 06/20/2014 21:22   Dg Abd Acute W/chest  06/20/2014   CLINICAL DATA:  Nausea, vomiting and mid lower abdominal pain for 2 days.  EXAM: DG ABDOMEN ACUTE W/ 1V CHEST  COMPARISON:  CT abdomen pelvis 02/10/2014 and chest radiograph 02/07/2012.  FINDINGS: Frontal view of the chest shows midline trachea. Heart is at the upper limits of normal in size to mildly enlarged, stable. Right hilar prominence is unchanged from 02/07/2012. Lungs are low in volume with probable scattered linear scarring in the left perihilar region and both lung bases. No definite pleural fluid.  Supine and left lateral decubitus views of the abdomen show slight paucity of gas. No definite small bowel dilatation. No free air. Image quality  is degraded by body habitus. Scoliosis.  IMPRESSION: 1. Slight paucity of gas in the abdomen which can be seen in setting of vomiting. 2. No acute findings in the chest.   Electronically Signed   By: Lorin Picket M.D.   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

## 2014-06-22 NOTE — Discharge Summary (Signed)
Discharge summary dictated on 06/22/2014 dictation number is [phone]

## 2014-06-22 NOTE — Discharge Summary (Signed)
NAMEMICHALENA, Michelle Holmes                  ACCOUNT NO.:  [id number]  MEDICAL RECORD NO.:  [id number]  LOCATION:  [id number]                         FACILITY:  [facility]  PHYSICIAN:  Melda Mermelstein N. Terrence Dupont, M.D. DATE OF BIRTH:  23-Feb-1950  DATE OF ADMISSION:  06/20/2014 DATE OF DISCHARGE:  06/22/2014                              DISCHARGE SUMMARY   ADMITTING DIAGNOSES: 1. Abdominal pain with nausea, vomiting; rule out pancreatitis, rule     out gastroenteritis. 2. Acute on chronic kidney disease, stage 4. 3. Dehydration. 4. Coronary artery disease, history of non-Q-wave myocardial     infarction in the past, status post PCI to LAD in the past. 5. Hypertension. 6. Diabetes mellitus. 7. Hypercholesteremia. 8. Anemia of chronic disease. 9. Morbid obesity. 10.Obstructive sleep apnea/obesity hypoventilation syndrome. 11.History of nephrolithiasis. 12.History of cancer of colon.  DISCHARGE DIAGNOSES: 1. Status post abdominal pain/nausea-vomiting, probably secondary to     gastroenteritis. 2. Resolving acute on chronic kidney disease, stage 4. 3. Status post dehydration. 4. Coronary artery disease, history of non-Q-wave myocardial     infarction in the past, status post PCI to LAD in the past. 5. Hypertension. 6. Diabetes mellitus. 7. Hypercholesteremia. 8. Anemia of chronic disease. 9. Heme-positive stool. 10.History of cancer of colon. 11.Morbid obesity. 12.Obstructive sleep apnea/obesity hypoventilation syndrome. 13.History of nephrolithiasis. 14.History of scoliosis. 15.Degenerative joint disease.  DISCHARGE HOME MEDICATIONS: 1. Aspirin 81 mg 1 tablet daily. 2. Ferrous sulfate 325 mg twice daily. 3. Lopid 600 mg twice daily. 4. Lantus insulin 60 units twice daily. 5. Losartan 100 mg 1 tablet daily. 6. Metoprolol succinate 100 mg daily. 7. Zofran 4 mg every 6 hours as needed. 8. Crestor 10 mg daily. 9. Protonix 40 mg 1 tablet daily. The patient has been advised to stop Xanax, amlodipine,  clopidogrel, Lyrica, and oxycodone.  DIET:  Low salt low cholesterol 1800 calories ADA diet.  INSTRUCTIONS:  The patient has been advised to monitor blood pressure and blood sugar daily and chart.  Will repeat CBC and basic metabolic panel as outpatient.  The patient will follow up with GI as outpatient, and we will refer her to Renal Service if creatinine remains elevated as outpatient.  CONDITION AT DISCHARGE:  Stable.  BRIEF HISTORY:  Michelle Holmes is a 64 year old female with past medical history significant for history of non-Q-wave myocardial infarction in the past, status post PCI to LAD in the past, hypertension, insulin- requiring diabetes mellitus, morbid obesity, obstructive sleep apnea/obesity hypoventilation syndrome, degenerative joint disease, history of scoliosis, chronic kidney disease stage 4, anemia of chronic disease, history of nephrolithiasis.  She came to the ER complaining of vague abdominal pain, associated with nausea and vomiting for last 2 days and was noted to have worsening renal function and dehydration. The patient denies any chest pain or shortness of breath, denies diarrhea, denies fever but complains of chill, was noted to have low- grade fever.  The patient denies any cough or sore throat, denies any urinary complaints.  PHYSICAL EXAMINATION:  GENERAL:  On examination, she was alert, awake, oriented x3. VITAL SIGNS:  Blood pressure was 164/76, pulse 95.  She had low-grade temp of 100.5. HEENT:  Conjunctivae were pink.  NECK:  Supple.  No JVD.  No bruit. LUNGS:  Clear to auscultation without rhonchi or rales. CARDIOVASCULAR:  S1, S2 was normal.  There was soft systolic murmur and S4 gallop. ABDOMEN:  Soft.  Bowel sounds were present.  Nontender. EXTREMITIES:  There was no clubbing, cyanosis, or edema.  LABORATORY DATA:  Sodium was 139, potassium 4.1, BUN 43, creatinine 3.58.  Her blood sugar was 236.  Her amylase was 47, lipase was 24 which were  in normal range.  Troponin I was negative.  Hemoglobin was 9.5, hematocrit 28.4, white count 7.0.  The patient had an abdominal ultrasound.  Urine cultures were negative.  Abdominal ultrasound showed suboptimal examination, nonobstructive right lower pole renal calculus, very mild left hydronephrosis, prior cholecystectomy.  Stool for occult blood was positive.  Hemoglobin A1c was elevated 11.9.  Repeat electrolytes; sodium 140, potassium 4.2, BUN 46; creatinine 3.21, which is trending down.  Hemoglobin has been stable for last 2 days. Hemoglobin is 8.7, hematocrit 26.2, white count of 5.4, platelet count 140,000.  BRIEF HOSPITAL COURSE:  The patient was admitted to regular floor, was started on IV fluids and kept n.p.o.  Her diet was gradually advanced. The patient did not have any further episodes of nausea, vomiting, or abdominal pain.  Discussed with the patient regarding GI evaluation as she has heme-positive stool.  The patient is eager to go home and states she will follow up with GI as outpatient, same with renal function.  Her renal function is gradually improving.  The patient is eager to go home and will follow up with Renal Service also as outpatient.  The patient is tolerating heart healthy carb-modified diet without any abdominal pain, nausea, or vomiting.  The patient remains afebrile and will be discharged home on above medications.  Plavix has been discontinued, and we will follow her renal function and CBC as outpatient closely.  The patient has been advised to monitor blood sugar and blood pressure at home and chart. The patient also has been discussed at length regarding lifestyle changes, diet, and exercise.     Allegra Lai. Terrence Dupont, M.D.     MNH/MEDQ  D:  06/22/2014  T:  06/22/2014  Job:  [id number]

## 2014-07-18 DIAGNOSIS — M4806 Spinal stenosis, lumbar region: Secondary | ICD-10-CM | POA: Diagnosis not present

## 2014-07-18 DIAGNOSIS — M545 Low back pain: Secondary | ICD-10-CM | POA: Diagnosis not present

## 2014-07-23 DIAGNOSIS — J449 Chronic obstructive pulmonary disease, unspecified: Secondary | ICD-10-CM | POA: Diagnosis not present

## 2014-08-08 DIAGNOSIS — M4806 Spinal stenosis, lumbar region: Secondary | ICD-10-CM | POA: Diagnosis not present

## 2014-08-17 DIAGNOSIS — N189 Chronic kidney disease, unspecified: Secondary | ICD-10-CM | POA: Diagnosis not present

## 2014-08-17 DIAGNOSIS — I10 Essential (primary) hypertension: Secondary | ICD-10-CM | POA: Diagnosis not present

## 2014-08-17 DIAGNOSIS — I252 Old myocardial infarction: Secondary | ICD-10-CM | POA: Diagnosis not present

## 2014-08-17 DIAGNOSIS — E119 Type 2 diabetes mellitus without complications: Secondary | ICD-10-CM | POA: Diagnosis not present

## 2014-08-17 DIAGNOSIS — I2510 Atherosclerotic heart disease of native coronary artery without angina pectoris: Secondary | ICD-10-CM | POA: Diagnosis not present

## 2014-08-21 DIAGNOSIS — I10 Essential (primary) hypertension: Secondary | ICD-10-CM | POA: Diagnosis not present

## 2014-08-21 DIAGNOSIS — I2510 Atherosclerotic heart disease of native coronary artery without angina pectoris: Secondary | ICD-10-CM | POA: Diagnosis not present

## 2014-08-21 DIAGNOSIS — E785 Hyperlipidemia, unspecified: Secondary | ICD-10-CM | POA: Diagnosis not present

## 2014-08-21 DIAGNOSIS — E119 Type 2 diabetes mellitus without complications: Secondary | ICD-10-CM | POA: Diagnosis not present

## 2014-08-22 DIAGNOSIS — J449 Chronic obstructive pulmonary disease, unspecified: Secondary | ICD-10-CM | POA: Diagnosis not present

## 2014-08-27 ENCOUNTER — Encounter (HOSPITAL_COMMUNITY): Payer: Self-pay | Admitting: *Deleted

## 2014-08-27 ENCOUNTER — Emergency Department (HOSPITAL_COMMUNITY)
Admission: EM | Admit: 2014-08-27 | Discharge: 2014-08-27 | Disposition: A | Discharge status: Home / Self Care | Payer: Medicare Other | Attending: Emergency Medicine | Admitting: Emergency Medicine

## 2014-08-27 DIAGNOSIS — E872 Acidosis, unspecified: Secondary | ICD-10-CM

## 2014-08-27 DIAGNOSIS — Z8669 Personal history of other diseases of the nervous system and sense organs: Secondary | ICD-10-CM | POA: Insufficient documentation

## 2014-08-27 DIAGNOSIS — Z85038 Personal history of other malignant neoplasm of large intestine: Secondary | ICD-10-CM | POA: Diagnosis not present

## 2014-08-27 DIAGNOSIS — Z7982 Long term (current) use of aspirin: Secondary | ICD-10-CM | POA: Diagnosis not present

## 2014-08-27 DIAGNOSIS — Z794 Long term (current) use of insulin: Secondary | ICD-10-CM | POA: Insufficient documentation

## 2014-08-27 DIAGNOSIS — M419 Scoliosis, unspecified: Secondary | ICD-10-CM | POA: Insufficient documentation

## 2014-08-27 DIAGNOSIS — I252 Old myocardial infarction: Secondary | ICD-10-CM | POA: Diagnosis not present

## 2014-08-27 DIAGNOSIS — G8929 Other chronic pain: Secondary | ICD-10-CM | POA: Diagnosis not present

## 2014-08-27 DIAGNOSIS — E119 Type 2 diabetes mellitus without complications: Secondary | ICD-10-CM | POA: Diagnosis not present

## 2014-08-27 DIAGNOSIS — N289 Disorder of kidney and ureter, unspecified: Secondary | ICD-10-CM | POA: Diagnosis not present

## 2014-08-27 DIAGNOSIS — R112 Nausea with vomiting, unspecified: Secondary | ICD-10-CM | POA: Diagnosis present

## 2014-08-27 DIAGNOSIS — I2510 Atherosclerotic heart disease of native coronary artery without angina pectoris: Secondary | ICD-10-CM | POA: Diagnosis not present

## 2014-08-27 DIAGNOSIS — D638 Anemia in other chronic diseases classified elsewhere: Secondary | ICD-10-CM | POA: Insufficient documentation

## 2014-08-27 DIAGNOSIS — Z9861 Coronary angioplasty status: Secondary | ICD-10-CM | POA: Diagnosis not present

## 2014-08-27 DIAGNOSIS — E780 Pure hypercholesterolemia: Secondary | ICD-10-CM | POA: Diagnosis not present

## 2014-08-27 DIAGNOSIS — E662 Morbid (severe) obesity with alveolar hypoventilation: Secondary | ICD-10-CM | POA: Insufficient documentation

## 2014-08-27 DIAGNOSIS — N189 Chronic kidney disease, unspecified: Secondary | ICD-10-CM

## 2014-08-27 DIAGNOSIS — Z79899 Other long term (current) drug therapy: Secondary | ICD-10-CM | POA: Diagnosis not present

## 2014-08-27 DIAGNOSIS — Z8673 Personal history of transient ischemic attack (TIA), and cerebral infarction without residual deficits: Secondary | ICD-10-CM | POA: Diagnosis not present

## 2014-08-27 DIAGNOSIS — I10 Essential (primary) hypertension: Secondary | ICD-10-CM | POA: Insufficient documentation

## 2014-08-27 DIAGNOSIS — I509 Heart failure, unspecified: Secondary | ICD-10-CM | POA: Insufficient documentation

## 2014-08-27 DIAGNOSIS — J449 Chronic obstructive pulmonary disease, unspecified: Secondary | ICD-10-CM | POA: Diagnosis not present

## 2014-08-27 DIAGNOSIS — D631 Anemia in chronic kidney disease: Secondary | ICD-10-CM

## 2014-08-27 DIAGNOSIS — Z87891 Personal history of nicotine dependence: Secondary | ICD-10-CM | POA: Insufficient documentation

## 2014-08-27 DIAGNOSIS — Z880 Allergy status to penicillin: Secondary | ICD-10-CM | POA: Diagnosis not present

## 2014-08-27 LAB — CBC WITH DIFFERENTIAL/PLATELET
Basophils Absolute: 0 10*3/uL (ref 0.0–0.1)
Basophils Relative: 1 % (ref 0–1)
Eosinophils Absolute: 0.4 10*3/uL (ref 0.0–0.7)
Eosinophils Relative: 5 % (ref 0–5)
HCT: 30.4 % — ABNORMAL LOW (ref 36.0–46.0)
Hemoglobin: 10.5 g/dL — ABNORMAL LOW (ref 12.0–15.0)
Lymphocytes Relative: 15 % (ref 12–46)
Lymphs Abs: 1.2 10*3/uL (ref 0.7–4.0)
MCH: 31.8 pg (ref 26.0–34.0)
MCHC: 34.5 g/dL (ref 30.0–36.0)
MCV: 92.1 fL (ref 78.0–100.0)
Monocytes Absolute: 0.4 10*3/uL (ref 0.1–1.0)
Monocytes Relative: 5 % (ref 3–12)
Neutro Abs: 5.9 10*3/uL (ref 1.7–7.7)
Neutrophils Relative %: 74 % (ref 43–77)
Platelets: 150 10*3/uL (ref 150–400)
RBC: 3.3 MIL/uL — ABNORMAL LOW (ref 3.87–5.11)
RDW: 14.6 % (ref 11.5–15.5)
WBC: 7.9 10*3/uL (ref 4.0–10.5)

## 2014-08-27 LAB — COMPREHENSIVE METABOLIC PANEL
ALT: 12 U/L — ABNORMAL LOW (ref 14–54)
AST: 17 U/L (ref 15–41)
Albumin: 3.5 g/dL (ref 3.5–5.0)
Alkaline Phosphatase: 84 U/L (ref 38–126)
Anion gap: 9 (ref 5–15)
BUN: 38 mg/dL — ABNORMAL HIGH (ref 6–20)
CO2: 18 mmol/L — ABNORMAL LOW (ref 22–32)
Calcium: 8.9 mg/dL (ref 8.9–10.3)
Chloride: 111 mmol/L (ref 101–111)
Creatinine, Ser: 2.71 mg/dL — ABNORMAL HIGH (ref 0.44–1.00)
GFR calc Af Amer: 20 mL/min — ABNORMAL LOW (ref >60)
GFR calc non Af Amer: 18 mL/min — ABNORMAL LOW (ref >60)
Glucose, Bld: 251 mg/dL — ABNORMAL HIGH (ref 65–99)
Potassium: 3.7 mmol/L (ref 3.5–5.1)
Sodium: 138 mmol/L (ref 135–145)
Total Bilirubin: 0.6 mg/dL (ref 0.3–1.2)
Total Protein: 6.9 g/dL (ref 6.5–8.1)

## 2014-08-27 LAB — LIPASE, BLOOD: Lipase: 37 U/L (ref 22–51)

## 2014-08-27 LAB — CBG MONITORING, ED: Glucose-Capillary: 234 mg/dL — ABNORMAL HIGH (ref 65–99)

## 2014-08-27 LAB — TROPONIN I: Troponin I: <0.03 ng/mL (ref <0.031)

## 2014-08-27 MED ORDER — ONDANSETRON HCL 4 MG PO TABS: 4.0000 mg | ORAL_TABLET | Freq: Four times a day (QID) | ORAL | Status: DC

## 2014-08-27 MED ORDER — ONDANSETRON HCL 4 MG/2ML IJ SOLN
4.0000 mg | Freq: Once | INTRAMUSCULAR | Status: AC
  Administered 2014-08-27: 4 mg via INTRAVENOUS
  Filled 2014-08-27: qty 2

## 2014-08-27 MED ORDER — SODIUM CHLORIDE 0.9 % IV SOLN
1000.0000 mL | Freq: Once | INTRAVENOUS | Status: AC
  Administered 2014-08-27: 1000 mL via INTRAVENOUS

## 2014-08-27 MED ORDER — SODIUM CHLORIDE 0.9 % IV SOLN
1000.0000 mL | INTRAVENOUS | Status: DC
  Administered 2014-08-27: 1000 mL via INTRAVENOUS

## 2014-08-27 NOTE — ED Provider Notes (Signed)
CSN: [id number]     Arrival date & time 08/27/14  0321 History   First MD Initiated Contact with Patient 08/27/14 0408     Chief Complaint  Patient presents with  . Nausea  . Emesis     (Consider location/radiation/quality/duration/timing/severity/associated sxs/prior Treatment) Patient is a 64 y.o. female presenting with vomiting. The history is provided by the patient.  Emesis She woke up at about 2 AM with nausea and vomiting. She's vomited multiple times and continues to have nausea. She denies abdominal pain. She denies chest pain, heaviness, tightness, pressure. She denies dyspnea. There's been no fever, chills, sweats. Nothing makes symptoms better nothing makes them worse. She reportedly had eaten butter to clinic and a sloppy Joe prior to going to bed. She reportedly had a similar episode about one month ago.  Past Medical History  Diagnosis Date  . Emphysema   . Colon cancer   . Hypertension   . Hypercholesteremia   . Diabetes mellitus   . COPD (chronic obstructive pulmonary disease)   . Coronary artery disease   . Obesity   . Sleep apnea   . TIA (transient ischemic attack)   . CHF (congestive heart failure)   . Anemia   . DJD (degenerative joint disease)   . Renal insufficiency   . Chronic back pain   . Scoliosis   . Obesity hypoventilation syndrome   . Stroke   . Myocardial infarction    Past Surgical History  Procedure Laterality Date  . Coronary angioplasty with stent placement    . Colon surgery     No family history on file. History  Substance Use Topics  . Smoking status: Former Research scientist (life sciences)  . Smokeless tobacco: Not on file  . Alcohol Use: No   OB History    No data available     Review of Systems  Gastrointestinal: Positive for vomiting.  All other systems reviewed and are negative.     Allergies  Penicillins  Home Medications   Prior to Admission medications   Medication Sig Start Date End Date Taking? Authorizing Provider  ADVAIR DISKUS  250-50 MCG/DOSE AEPB Inhale 1 puff into the lungs 2 (two) times daily.  08/22/14  Yes Historical Provider, MD  alprazolam Duanne Moron) 2 MG tablet Take 4 mg by mouth at bedtime.   Yes Historical Provider, MD  amLODipine (NORVASC) 10 MG tablet Take 10 mg by mouth daily.  08/22/14  Yes Historical Provider, MD  aspirin EC 81 MG EC tablet Take 1 tablet (81 mg total) by mouth daily. 06/22/14  Yes Charolette Forward, MD  atorvastatin (LIPITOR) 40 MG tablet Take 40 mg by mouth daily. 07/25/14  Yes Historical Provider, MD  clopidogrel (PLAVIX) 75 MG tablet Take 75 mg by mouth daily. 07/25/14  Yes Historical Provider, MD  ferrous sulfate 325 (65 FE) MG tablet Take 1 tablet (325 mg total) by mouth 2 (two) times daily with a meal. 06/22/14 08/18/17 Yes Charolette Forward, MD  gemfibrozil (LOPID) 600 MG tablet Take 600 mg by mouth 2 (two) times daily before a meal.   Yes Historical Provider, MD  insulin glargine (LANTUS) 100 UNIT/ML injection Inject 0.6 mLs (60 Units total) into the skin 2 (two) times daily. 06/22/14  Yes Charolette Forward, MD  losartan (COZAAR) 100 MG tablet Take 100 mg by mouth daily.  12/01/10  Yes Historical Provider, MD  LYRICA 50 MG capsule Take 50 mg by mouth 2 (two) times daily. 08/01/14  Yes Historical Provider, MD  metoprolol succinate (TOPROL-XL)  100 MG 24 hr tablet Take 100 mg by mouth daily. Take with or immediately following a meal.   Yes Historical Provider, MD  ondansetron (ZOFRAN) 4 MG tablet Take 1 tablet (4 mg total) by mouth every 6 (six) hours. 02/10/14  Yes Blanchie Dessert, MD  OxyCODONE (OXYCONTIN) 80 mg T12A 12 hr tablet Take 80 mg by mouth 2 (two) times daily as needed (pain).  08/15/14  Yes Historical Provider, MD  pantoprazole (PROTONIX) 40 MG tablet Take 40 mg by mouth daily. 06/04/14  Yes Historical Provider, MD  rosuvastatin (CRESTOR) 10 MG tablet Take 10 mg by mouth daily.  12/01/10  Yes Historical Provider, MD   BP 182/81 mmHg  Pulse 60  Temp(Src) 98.4 F (36.9 C) (Oral)  Resp 18  SpO2  95% Physical Exam  Nursing note and vitals reviewed.  64 year old female, resting comfortably and in no acute distress. Vital signs are significant for hypertension. Oxygen saturation is 95%, which is normal. Head is normocephalic and atraumatic. PERRLA, EOMI. Oropharynx is clear. Neck is nontender and supple without adenopathy or JVD. Back is nontender and there is no CVA tenderness. Lungs are clear without rales, wheezes, or rhonchi. Chest is nontender. Heart has regular rate and rhythm without murmur. Abdomen is soft, flat, nontender without masses or hepatosplenomegaly and peristalsis is hypoactive. Extremities have no cyanosis or edema, full range of motion is present. Skin is warm and dry without rash. Neurologic: Mental status is normal, cranial nerves are intact, there are no motor or sensory deficits.  ED Course  Procedures (including critical care time) Labs Review Results for orders placed or performed during the hospital encounter of 08/27/14  CBC with Differential  Result Value Ref Range   WBC 7.9 4.0 - 10.5 K/uL   RBC 3.30 (L) 3.87 - 5.11 MIL/uL   Hemoglobin 10.5 (L) 12.0 - 15.0 g/dL   HCT 30.4 (L) 36.0 - 46.0 %   MCV 92.1 78.0 - 100.0 fL   MCH 31.8 26.0 - 34.0 pg   MCHC 34.5 30.0 - 36.0 g/dL   RDW 14.6 11.5 - 15.5 %   Platelets 150 150 - 400 K/uL   Neutrophils Relative % 74 43 - 77 %   Neutro Abs 5.9 1.7 - 7.7 K/uL   Lymphocytes Relative 15 12 - 46 %   Lymphs Abs 1.2 0.7 - 4.0 K/uL   Monocytes Relative 5 3 - 12 %   Monocytes Absolute 0.4 0.1 - 1.0 K/uL   Eosinophils Relative 5 0 - 5 %   Eosinophils Absolute 0.4 0.0 - 0.7 K/uL   Basophils Relative 1 0 - 1 %   Basophils Absolute 0.0 0.0 - 0.1 K/uL  Lipase, blood  Result Value Ref Range   Lipase 37 22 - 51 U/L  Comprehensive metabolic panel  Result Value Ref Range   Sodium 138 135 - 145 mmol/L   Potassium 3.7 3.5 - 5.1 mmol/L   Chloride 111 101 - 111 mmol/L   CO2 18 (L) 22 - 32 mmol/L   Glucose, Bld 251  (H) 65 - 99 mg/dL   BUN 38 (H) 6 - 20 mg/dL   Creatinine, Ser 2.71 (H) 0.44 - 1.00 mg/dL   Calcium 8.9 8.9 - 10.3 mg/dL   Total Protein 6.9 6.5 - 8.1 g/dL   Albumin 3.5 3.5 - 5.0 g/dL   AST 17 15 - 41 U/L   ALT 12 (L) 14 - 54 U/L   Alkaline Phosphatase 84 38 - 126 U/L  Total Bilirubin 0.6 0.3 - 1.2 mg/dL   GFR calc non Af Amer 18 (L) >60 mL/min   GFR calc Af Amer 20 (L) >60 mL/min   Anion gap 9 5 - 15  Troponin I  Result Value Ref Range   Troponin I <0.03 <0.031 ng/mL  CBG monitoring, ED  Result Value Ref Range   Glucose-Capillary 234 (H) 65 - 99 mg/dL   Comment 1 Notify RN    Comment 2 Document in Chart     EKG Interpretation   Date/Time:  Monday August 27 2014 04:00:55 EDT Ventricular Rate:  60 PR Interval:  192 QRS Duration: 88 QT Interval:  447 QTC Calculation: 447 R Axis:   10 Text Interpretation:  Sinus rhythm LVH by voltage When compared with ECG  of 06/20/2014, HEART RATE has decreased Confirmed by [facility]  MD, Mauriana Dann  ([id number]) on 08/27/2014 4:57:55 AM      MDM   Final diagnoses:  Nausea and vomiting, vomiting of unspecified type  Renal insufficiency  Anemia of renal disease  Metabolic acidosis    Nausea and vomiting of uncertain cause. I suspect this is a viral gastritis. I have reviewed her past records and she had a CT scan in January showing surgical absence of gallbladder. She will be given IV fluids and IV ondansetron. Screening labs will be obtained. ECG and troponin will be obtained to make sure this is not an occult acute coronary syndrome.  ECG is unremarkable and troponin is normal. She feels much better after IV fluids and IV ondansetron. Laboratory workup shows stable anemia which is presumably secondary to underlying renal disease. Stable renal insufficiency, and metabolic acidosis which is unchanged from baseline and within normal anion gap. She is discharged with prescription for ondansetron.  Delora Fuel, MD [id number]

## 2014-08-27 NOTE — ED Notes (Signed)
Per EMS, when they arrived at pt home, she had active vomiting, alert and oriented,  N/V x 1 hour. She had dinner and corn on the cob with a lot of butter along with  sloppy joe. BP : 210/98  SpO2: 98%RA  P: 66 CBG-196 Per EMS pt states she had an episode similar to this 1 month ago.

## 2014-08-27 NOTE — ED Notes (Signed)
MD at bedside. 

## 2014-08-27 NOTE — ED Notes (Signed)
Bed: [id number] Expected date: 08/27/14 Expected time: 3:06 AM Means of arrival: Ambulance Comments: N,v x one hour

## 2014-08-27 NOTE — Discharge Instructions (Signed)
Nausea and Vomiting °Nausea is a sick feeling that often comes before throwing up (vomiting). Vomiting is a reflex where stomach contents come out of your mouth. Vomiting can cause severe loss of body fluids (dehydration). Children and elderly adults can become dehydrated quickly, especially if they also have diarrhea. Nausea and vomiting are symptoms of a condition or disease. It is important to find the cause of your symptoms. °CAUSES  °· Direct irritation of the stomach lining. This irritation can result from increased acid production (gastroesophageal reflux disease), infection, food poisoning, taking certain medicines (such as nonsteroidal anti-inflammatory drugs), alcohol use, or tobacco use. °· Signals from the brain. These signals could be caused by a headache, heat exposure, an inner ear disturbance, increased pressure in the brain from injury, infection, a tumor, or a concussion, pain, emotional stimulus, or metabolic problems. °· An obstruction in the gastrointestinal tract (bowel obstruction). °· Illnesses such as diabetes, hepatitis, gallbladder problems, appendicitis, kidney problems, cancer, sepsis, atypical symptoms of a heart attack, or eating disorders. °· Medical treatments such as chemotherapy and radiation. °· Receiving medicine that makes you sleep (general anesthetic) during surgery. °DIAGNOSIS °Your caregiver may ask for tests to be done if the problems do not improve after a few days. Tests may also be done if symptoms are severe or if the reason for the nausea and vomiting is not clear. Tests may include: °· Urine tests. °· Blood tests. °· Stool tests. °· Cultures (to look for evidence of infection). °· X-rays or other imaging studies. °Test results can help your caregiver make decisions about treatment or the need for additional tests. °TREATMENT °You need to stay well hydrated. Drink frequently but in small amounts. You may wish to drink water, sports drinks, clear broth, or eat frozen  ice pops or gelatin dessert to help stay hydrated. When you eat, eating slowly may help prevent nausea. There are also some antinausea medicines that may help prevent nausea. °HOME CARE INSTRUCTIONS  °· Take all medicine as directed by your caregiver. °· If you do not have an appetite, do not force yourself to eat. However, you must continue to drink fluids. °· If you have an appetite, eat a normal diet unless your caregiver tells you differently. °¨ Eat a variety of complex carbohydrates (rice, wheat, potatoes, bread), lean meats, yogurt, fruits, and vegetables. °¨ Avoid high-fat foods because they are more difficult to digest. °· Drink enough water and fluids to keep your urine clear or pale yellow. °· If you are dehydrated, ask your caregiver for specific rehydration instructions. Signs of dehydration may include: °¨ Severe thirst. °¨ Dry lips and mouth. °¨ Dizziness. °¨ Bosket urine. °¨ Decreasing urine frequency and amount. °¨ Confusion. °¨ Rapid breathing or pulse. °SEEK IMMEDIATE MEDICAL CARE IF:  °· You have blood or brown flecks (like coffee grounds) in your vomit. °· You have black or bloody stools. °· You have a severe headache or stiff neck. °· You are confused. °· You have severe abdominal pain. °· You have chest pain or trouble breathing. °· You do not urinate at least once every 8 hours. °· You develop cold or clammy skin. °· You continue to vomit for longer than 24 to 48 hours. °· You have a fever. °MAKE SURE YOU:  °· Understand these instructions. °· Will watch your condition. °· Will get help right away if you are not doing well or get worse. °Document Released: 01/12/2005 Document Revised: 04/06/2011 Document Reviewed: 06/11/2010 °Exit[employer]® Patient Information ©2015 [employer]. This information is not intended   to replace advice given to you by your health care provider. Make sure you discuss any questions you have with your health care provider.  Ondansetron tablets What is this  medicine? ONDANSETRON (on DAN se tron) is used to treat nausea and vomiting caused by chemotherapy. It is also used to prevent or treat nausea and vomiting after surgery. This medicine may be used for other purposes; ask your health care provider or pharmacist if you have questions. COMMON BRAND NAME(S): Zofran What should I tell my health care provider before I take this medicine? They need to know if you have any of these conditions: -heart disease -history of irregular heartbeat -liver disease -low levels of magnesium or potassium in the blood -an unusual or allergic reaction to ondansetron, granisetron, other medicines, foods, dyes, or preservatives -pregnant or trying to get pregnant -breast-feeding How should I use this medicine? Take this medicine by mouth with a glass of water. Follow the directions on your prescription label. Take your doses at regular intervals. Do not take your medicine more often than directed. Talk to your pediatrician regarding the use of this medicine in children. Special care may be needed. Overdosage: If you think you have taken too much of this medicine contact a poison control center or emergency room at once. NOTE: This medicine is only for you. Do not share this medicine with others. What if I miss a dose? If you miss a dose, take it as soon as you can. If it is almost time for your next dose, take only that dose. Do not take double or extra doses. What may interact with this medicine? Do not take this medicine with any of the following medications: -apomorphine -certain medicines for fungal infections like fluconazole, itraconazole, ketoconazole, posaconazole, voriconazole -cisapride -dofetilide -dronedarone -pimozide -thioridazine -ziprasidone This medicine may also interact with the following medications: -carbamazepine -certain medicines for depression, anxiety, or psychotic disturbances -fentanyl -linezolid -MAOIs like Carbex, Eldepryl,  Marplan, Nardil, and Parnate -methylene blue (injected into a vein) -other medicines that prolong the QT interval (cause an abnormal heart rhythm) -phenytoin -rifampicin -tramadol This list may not describe all possible interactions. Give your health care provider a list of all the medicines, herbs, non-prescription drugs, or dietary supplements you use. Also tell them if you smoke, drink alcohol, or use illegal drugs. Some items may interact with your medicine. What should I watch for while using this medicine? Check with your doctor or health care professional right away if you have any sign of an allergic reaction. What side effects may I notice from receiving this medicine? Side effects that you should report to your doctor or health care professional as soon as possible: -allergic reactions like skin rash, itching or hives, swelling of the face, lips or tongue -breathing problems -confusion -dizziness -fast or irregular heartbeat -feeling faint or lightheaded, falls -fever and chills -loss of balance or coordination -seizures -sweating -swelling of the hands or feet -tightness in the chest -tremors -unusually weak or tired Side effects that usually do not require medical attention (report to your doctor or health care professional if they continue or are bothersome): -constipation or diarrhea -headache This list may not describe all possible side effects. Call your doctor for medical advice about side effects. You may report side effects to FDA at 1-800-FDA-1088. Where should I keep my medicine? Keep out of the reach of children. Store between 2 and 30 degrees C (36 and 86 degrees F). Throw away any unused medicine after the expiration  date. NOTE: This sheet is a summary. It may not cover all possible information. If you have questions about this medicine, talk to your doctor, pharmacist, or health care provider.  2015, [employer]. (2012-10-19 16:27:45)

## 2014-09-03 DIAGNOSIS — I252 Old myocardial infarction: Secondary | ICD-10-CM | POA: Diagnosis not present

## 2014-09-03 DIAGNOSIS — I10 Essential (primary) hypertension: Secondary | ICD-10-CM | POA: Diagnosis not present

## 2014-09-03 DIAGNOSIS — E119 Type 2 diabetes mellitus without complications: Secondary | ICD-10-CM | POA: Diagnosis not present

## 2014-09-03 DIAGNOSIS — I2510 Atherosclerotic heart disease of native coronary artery without angina pectoris: Secondary | ICD-10-CM | POA: Diagnosis not present

## 2014-09-03 DIAGNOSIS — N189 Chronic kidney disease, unspecified: Secondary | ICD-10-CM | POA: Diagnosis not present

## 2014-09-05 DIAGNOSIS — M4806 Spinal stenosis, lumbar region: Secondary | ICD-10-CM | POA: Diagnosis not present

## 2014-09-05 DIAGNOSIS — M545 Low back pain: Secondary | ICD-10-CM | POA: Diagnosis not present

## 2014-09-22 DIAGNOSIS — J449 Chronic obstructive pulmonary disease, unspecified: Secondary | ICD-10-CM | POA: Diagnosis not present

## 2014-10-02 DIAGNOSIS — M4806 Spinal stenosis, lumbar region: Secondary | ICD-10-CM | POA: Diagnosis not present

## 2014-10-18 DIAGNOSIS — M4806 Spinal stenosis, lumbar region: Secondary | ICD-10-CM | POA: Diagnosis not present

## 2014-10-23 DIAGNOSIS — J449 Chronic obstructive pulmonary disease, unspecified: Secondary | ICD-10-CM | POA: Diagnosis not present

## 2014-10-27 DIAGNOSIS — R1111 Vomiting without nausea: Secondary | ICD-10-CM | POA: Diagnosis not present

## 2014-10-27 DIAGNOSIS — K2970 Gastritis, unspecified, without bleeding: Secondary | ICD-10-CM | POA: Diagnosis not present

## 2014-11-09 DIAGNOSIS — I2510 Atherosclerotic heart disease of native coronary artery without angina pectoris: Secondary | ICD-10-CM | POA: Diagnosis not present

## 2014-11-09 DIAGNOSIS — I129 Hypertensive chronic kidney disease with stage 1 through stage 4 chronic kidney disease, or unspecified chronic kidney disease: Secondary | ICD-10-CM | POA: Diagnosis not present

## 2014-11-09 DIAGNOSIS — I252 Old myocardial infarction: Secondary | ICD-10-CM | POA: Diagnosis not present

## 2014-11-09 DIAGNOSIS — E119 Type 2 diabetes mellitus without complications: Secondary | ICD-10-CM | POA: Diagnosis not present

## 2014-11-09 DIAGNOSIS — N189 Chronic kidney disease, unspecified: Secondary | ICD-10-CM | POA: Diagnosis not present

## 2014-11-22 DIAGNOSIS — J449 Chronic obstructive pulmonary disease, unspecified: Secondary | ICD-10-CM | POA: Diagnosis not present

## 2014-11-26 DIAGNOSIS — M4806 Spinal stenosis, lumbar region: Secondary | ICD-10-CM | POA: Diagnosis not present

## 2014-12-04 DIAGNOSIS — E669 Obesity, unspecified: Secondary | ICD-10-CM | POA: Diagnosis not present

## 2014-12-04 DIAGNOSIS — I10 Essential (primary) hypertension: Secondary | ICD-10-CM | POA: Diagnosis not present

## 2014-12-11 DIAGNOSIS — M4806 Spinal stenosis, lumbar region: Secondary | ICD-10-CM | POA: Diagnosis not present

## 2014-12-17 DIAGNOSIS — L02415 Cutaneous abscess of right lower limb: Secondary | ICD-10-CM | POA: Diagnosis not present

## 2014-12-23 DIAGNOSIS — J449 Chronic obstructive pulmonary disease, unspecified: Secondary | ICD-10-CM | POA: Diagnosis not present

## 2015-01-02 DIAGNOSIS — G8921 Chronic pain due to trauma: Secondary | ICD-10-CM | POA: Diagnosis not present

## 2015-01-02 DIAGNOSIS — M545 Low back pain: Secondary | ICD-10-CM | POA: Diagnosis not present

## 2015-01-02 DIAGNOSIS — R52 Pain, unspecified: Secondary | ICD-10-CM | POA: Diagnosis not present

## 2015-01-02 DIAGNOSIS — E669 Obesity, unspecified: Secondary | ICD-10-CM | POA: Diagnosis not present

## 2015-01-09 DIAGNOSIS — M4806 Spinal stenosis, lumbar region: Secondary | ICD-10-CM | POA: Diagnosis not present

## 2015-01-11 DIAGNOSIS — I129 Hypertensive chronic kidney disease with stage 1 through stage 4 chronic kidney disease, or unspecified chronic kidney disease: Secondary | ICD-10-CM | POA: Diagnosis not present

## 2015-01-11 DIAGNOSIS — I252 Old myocardial infarction: Secondary | ICD-10-CM | POA: Diagnosis not present

## 2015-01-11 DIAGNOSIS — E119 Type 2 diabetes mellitus without complications: Secondary | ICD-10-CM | POA: Diagnosis not present

## 2015-01-11 DIAGNOSIS — I2510 Atherosclerotic heart disease of native coronary artery without angina pectoris: Secondary | ICD-10-CM | POA: Diagnosis not present

## 2015-01-11 DIAGNOSIS — N189 Chronic kidney disease, unspecified: Secondary | ICD-10-CM | POA: Diagnosis not present

## 2015-01-12 ENCOUNTER — Emergency Department (HOSPITAL_COMMUNITY)
Admission: EM | Admit: 2015-01-12 | Discharge: 2015-01-12 | Disposition: A | Discharge status: Home / Self Care | Payer: Medicare Other | Source: Home / Self Care | Attending: Emergency Medicine | Admitting: Emergency Medicine

## 2015-01-12 ENCOUNTER — Encounter (HOSPITAL_COMMUNITY): Payer: Self-pay

## 2015-01-12 DIAGNOSIS — J449 Chronic obstructive pulmonary disease, unspecified: Secondary | ICD-10-CM | POA: Insufficient documentation

## 2015-01-12 DIAGNOSIS — E669 Obesity, unspecified: Secondary | ICD-10-CM | POA: Insufficient documentation

## 2015-01-12 DIAGNOSIS — I2510 Atherosclerotic heart disease of native coronary artery without angina pectoris: Secondary | ICD-10-CM

## 2015-01-12 DIAGNOSIS — M549 Dorsalgia, unspecified: Secondary | ICD-10-CM | POA: Diagnosis not present

## 2015-01-12 DIAGNOSIS — I252 Old myocardial infarction: Secondary | ICD-10-CM | POA: Insufficient documentation

## 2015-01-12 DIAGNOSIS — Z7982 Long term (current) use of aspirin: Secondary | ICD-10-CM | POA: Insufficient documentation

## 2015-01-12 DIAGNOSIS — R1310 Dysphagia, unspecified: Secondary | ICD-10-CM | POA: Diagnosis not present

## 2015-01-12 DIAGNOSIS — I639 Cerebral infarction, unspecified: Secondary | ICD-10-CM | POA: Diagnosis not present

## 2015-01-12 DIAGNOSIS — I509 Heart failure, unspecified: Secondary | ICD-10-CM

## 2015-01-12 DIAGNOSIS — Z8673 Personal history of transient ischemic attack (TIA), and cerebral infarction without residual deficits: Secondary | ICD-10-CM

## 2015-01-12 DIAGNOSIS — E7800 Pure hypercholesterolemia, unspecified: Secondary | ICD-10-CM | POA: Insufficient documentation

## 2015-01-12 DIAGNOSIS — Z87891 Personal history of nicotine dependence: Secondary | ICD-10-CM

## 2015-01-12 DIAGNOSIS — D649 Anemia, unspecified: Secondary | ICD-10-CM | POA: Insufficient documentation

## 2015-01-12 DIAGNOSIS — Z794 Long term (current) use of insulin: Secondary | ICD-10-CM

## 2015-01-12 DIAGNOSIS — Z87448 Personal history of other diseases of urinary system: Secondary | ICD-10-CM

## 2015-01-12 DIAGNOSIS — E1165 Type 2 diabetes mellitus with hyperglycemia: Secondary | ICD-10-CM | POA: Insufficient documentation

## 2015-01-12 DIAGNOSIS — Z7902 Long term (current) use of antithrombotics/antiplatelets: Secondary | ICD-10-CM | POA: Insufficient documentation

## 2015-01-12 DIAGNOSIS — E870 Hyperosmolality and hypernatremia: Secondary | ICD-10-CM | POA: Diagnosis not present

## 2015-01-12 DIAGNOSIS — Z9861 Coronary angioplasty status: Secondary | ICD-10-CM | POA: Insufficient documentation

## 2015-01-12 DIAGNOSIS — M1990 Unspecified osteoarthritis, unspecified site: Secondary | ICD-10-CM

## 2015-01-12 DIAGNOSIS — G8929 Other chronic pain: Secondary | ICD-10-CM

## 2015-01-12 DIAGNOSIS — Z79899 Other long term (current) drug therapy: Secondary | ICD-10-CM

## 2015-01-12 DIAGNOSIS — R471 Dysarthria and anarthria: Secondary | ICD-10-CM | POA: Diagnosis not present

## 2015-01-12 DIAGNOSIS — Z85038 Personal history of other malignant neoplasm of large intestine: Secondary | ICD-10-CM | POA: Insufficient documentation

## 2015-01-12 DIAGNOSIS — M419 Scoliosis, unspecified: Secondary | ICD-10-CM

## 2015-01-12 DIAGNOSIS — Z880 Allergy status to penicillin: Secondary | ICD-10-CM

## 2015-01-12 DIAGNOSIS — R531 Weakness: Secondary | ICD-10-CM | POA: Diagnosis not present

## 2015-01-12 DIAGNOSIS — I10 Essential (primary) hypertension: Secondary | ICD-10-CM | POA: Insufficient documentation

## 2015-01-12 DIAGNOSIS — R739 Hyperglycemia, unspecified: Secondary | ICD-10-CM

## 2015-01-12 DIAGNOSIS — E872 Acidosis: Secondary | ICD-10-CM | POA: Diagnosis not present

## 2015-01-12 DIAGNOSIS — N179 Acute kidney failure, unspecified: Secondary | ICD-10-CM | POA: Diagnosis not present

## 2015-01-12 LAB — URINE MICROSCOPIC-ADD ON
Bacteria, UA: NONE SEEN
RBC / HPF: NONE SEEN RBC/hpf (ref 0–5)

## 2015-01-12 LAB — URINALYSIS, ROUTINE W REFLEX MICROSCOPIC
Bilirubin Urine: NEGATIVE
Glucose, UA: >1000 mg/dL — AB
Hgb urine dipstick: NEGATIVE
Ketones, ur: NEGATIVE mg/dL
Leukocytes, UA: NEGATIVE
Nitrite: NEGATIVE
Protein, ur: NEGATIVE mg/dL
Specific Gravity, Urine: 1.022 (ref 1.005–1.030)
pH: 5 (ref 5.0–8.0)

## 2015-01-12 LAB — CBC
HCT: 30.7 % — ABNORMAL LOW (ref 36.0–46.0)
Hemoglobin: 10.6 g/dL — ABNORMAL LOW (ref 12.0–15.0)
MCH: 31.7 pg (ref 26.0–34.0)
MCHC: 34.5 g/dL (ref 30.0–36.0)
MCV: 91.9 fL (ref 78.0–100.0)
Platelets: 165 10*3/uL (ref 150–400)
RBC: 3.34 MIL/uL — ABNORMAL LOW (ref 3.87–5.11)
RDW: 14.7 % (ref 11.5–15.5)
WBC: 6.5 10*3/uL (ref 4.0–10.5)

## 2015-01-12 LAB — BASIC METABOLIC PANEL
Anion gap: 11 (ref 5–15)
BUN: 65 mg/dL — ABNORMAL HIGH (ref 6–20)
CO2: 19 mmol/L — ABNORMAL LOW (ref 22–32)
Calcium: 9.6 mg/dL (ref 8.9–10.3)
Chloride: 101 mmol/L (ref 101–111)
Creatinine, Ser: 2.7 mg/dL — ABNORMAL HIGH (ref 0.44–1.00)
GFR calc Af Amer: 20 mL/min — ABNORMAL LOW (ref >60)
GFR calc non Af Amer: 18 mL/min — ABNORMAL LOW (ref >60)
Glucose, Bld: 504 mg/dL — ABNORMAL HIGH (ref 65–99)
Potassium: 5.4 mmol/L — ABNORMAL HIGH (ref 3.5–5.1)
Sodium: 131 mmol/L — ABNORMAL LOW (ref 135–145)

## 2015-01-12 LAB — CBG MONITORING, ED
Glucose-Capillary: 465 mg/dL — ABNORMAL HIGH (ref 65–99)
Glucose-Capillary: 416 mg/dL — ABNORMAL HIGH (ref 65–99)
Glucose-Capillary: 318 mg/dL — ABNORMAL HIGH (ref 65–99)

## 2015-01-12 MED ORDER — SODIUM CHLORIDE 0.9 % IV BOLUS (SEPSIS)
1000.0000 mL | Freq: Once | INTRAVENOUS | Status: AC
  Administered 2015-01-12: 1000 mL via INTRAVENOUS

## 2015-01-12 MED ORDER — INSULIN ASPART 100 UNIT/ML ~~LOC~~ SOLN
6.0000 [IU] | Freq: Once | SUBCUTANEOUS | Status: AC
  Administered 2015-01-12: 6 [IU] via SUBCUTANEOUS
  Filled 2015-01-12: qty 1

## 2015-01-12 NOTE — ED Notes (Signed)
Pt states her CBG's have been running in 500-600 range x last week.  Went to see PMD yesterday and was told to increase insulin.  States this am CBG was 511.  Denies N/V/D, fevers or other complaints otherwise.

## 2015-01-12 NOTE — ED Provider Notes (Signed)
CSN: [id number]     Arrival date & time 01/12/15  1311 History   First MD Initiated Contact with Patient 01/12/15 1507     Chief Complaint  Patient presents with  . Hyperglycemia     (Consider location/radiation/quality/duration/timing/severity/associated sxs/prior Treatment) HPI Michelle Holmes is a 64 y.o. female with hx of DM, COPD, TIA, CHF, renal insufficiency, presents to ED with complaint of elevated blood sugars. Patient states her blood sugar is normally in mid 100s. Patient states that in the last 2 days her blood sugar has been over 500. She went to see her primary care doctor, Dr. Terrence Dupont,  yesterday who told her to increase her insulin if her blood sugar is high. Patient states she checks her blood sugar 3 times a day, states checks it after eating. She reports this morning after her blood sugar was over 500, she administered 2 extra units of insulin on top of her 6 units that she normally takes. Patient states that her blood sugar does not improve. Patient denies any recent illnesses. She denies any fever or chills. No urinary symptoms. No upper respiratory symptoms. Denies generalized malaise. Patient admits to not eating well recently due to holidays. She states she has been eating a lot of cookies.  Past Medical History  Diagnosis Date  . Emphysema   . Colon cancer ([place])   . Hypertension   . Hypercholesteremia   . Diabetes mellitus   . COPD (chronic obstructive pulmonary disease) ([place])   . Coronary artery disease   . Obesity   . Sleep apnea   . TIA (transient ischemic attack)   . CHF (congestive heart failure) ([place])   . Anemia   . DJD (degenerative joint disease)   . Renal insufficiency   . Chronic back pain   . Scoliosis   . Obesity hypoventilation syndrome ([place])   . Stroke ([place])   . Myocardial infarction [facility])    Past Surgical History  Procedure Laterality Date  . Coronary angioplasty with stent placement    . Colon surgery     No family history on  file. Social History  Substance Use Topics  . Smoking status: Former Research scientist (life sciences)  . Smokeless tobacco: None  . Alcohol Use: No   OB History    No data available     Review of Systems  Constitutional: Negative for fever and chills.  Respiratory: Negative for cough, chest tightness and shortness of breath.   Cardiovascular: Negative for chest pain, palpitations and leg swelling.  Gastrointestinal: Negative for nausea, vomiting, abdominal pain and diarrhea.  Genitourinary: Negative for dysuria, flank pain and pelvic pain.  Musculoskeletal: Negative for myalgias, arthralgias, neck pain and neck stiffness.  Skin: Negative for rash.  Neurological: Negative for dizziness, weakness and headaches.  All other systems reviewed and are negative.     Allergies  Penicillins  Home Medications   Prior to Admission medications   Medication Sig Start Date End Date Taking? Authorizing Provider  ADVAIR DISKUS 250-50 MCG/DOSE AEPB Inhale 1 puff into the lungs 2 (two) times daily.  08/22/14   Historical Provider, MD  alprazolam Duanne Moron) 2 MG tablet Take 4 mg by mouth at bedtime.    Historical Provider, MD  amLODipine (NORVASC) 10 MG tablet Take 10 mg by mouth daily.  08/22/14   Historical Provider, MD  aspirin EC 81 MG EC tablet Take 1 tablet (81 mg total) by mouth daily. 06/22/14   Charolette Forward, MD  atorvastatin (LIPITOR) 40 MG tablet Take 40 mg  by mouth daily. 07/25/14   Historical Provider, MD  clopidogrel (PLAVIX) 75 MG tablet Take 75 mg by mouth daily. 07/25/14   Historical Provider, MD  ferrous sulfate 325 (65 FE) MG tablet Take 1 tablet (325 mg total) by mouth 2 (two) times daily with a meal. 06/22/14 08/18/17  Charolette Forward, MD  gemfibrozil (LOPID) 600 MG tablet Take 600 mg by mouth 2 (two) times daily before a meal.    Historical Provider, MD  insulin glargine (LANTUS) 100 UNIT/ML injection Inject 0.6 mLs (60 Units total) into the skin 2 (two) times daily. 06/22/14   Charolette Forward, MD  losartan  (COZAAR) 100 MG tablet Take 100 mg by mouth daily.  12/01/10   Historical Provider, MD  LYRICA 50 MG capsule Take 50 mg by mouth 2 (two) times daily. 08/01/14   Historical Provider, MD  metoprolol succinate (TOPROL-XL) 100 MG 24 hr tablet Take 100 mg by mouth daily. Take with or immediately following a meal.    Historical Provider, MD  ondansetron (ZOFRAN) 4 MG tablet Take 1 tablet (4 mg total) by mouth every 6 (six) hours. [id number]   Delora Fuel, MD  OxyCODONE (OXYCONTIN) 80 mg T12A 12 hr tablet Take 80 mg by mouth 2 (two) times daily as needed (pain).  08/15/14   Historical Provider, MD  pantoprazole (PROTONIX) 40 MG tablet Take 40 mg by mouth daily. 06/04/14   Historical Provider, MD  rosuvastatin (CRESTOR) 10 MG tablet Take 10 mg by mouth daily.  12/01/10   Historical Provider, MD   BP 168/86 mmHg  Pulse 68  Temp(Src) 97.7 F (36.5 C) (Oral)  Resp 18  Ht 5\' 5"  (1.651 m)  Wt 102.513 kg  BMI 37.61 kg/m2  SpO2 100% Physical Exam  Constitutional: She is oriented to person, place, and time. She appears well-developed and well-nourished. No distress.  HENT:  Head: Normocephalic.  Eyes: Conjunctivae are normal.  Neck: Neck supple.  Cardiovascular: Normal rate, regular rhythm and normal heart sounds.   Pulmonary/Chest: Effort normal and breath sounds normal. No respiratory distress. She has no wheezes. She has no rales.  Abdominal: Soft. Bowel sounds are normal. She exhibits no distension. There is no tenderness. There is no rebound.  Musculoskeletal: She exhibits no edema.  Neurological: She is alert and oriented to person, place, and time.  Skin: Skin is warm and dry.  Psychiatric: She has a normal mood and affect. Her behavior is normal.  Nursing note and vitals reviewed.   ED Course  Procedures (including critical care time) Labs Review Labs Reviewed  BASIC METABOLIC PANEL - Abnormal; Notable for the following:    Sodium 131 (*)    Potassium 5.4 (*)    CO2 19 (*)    Glucose, Bld 504  (*)    BUN 65 (*)    Creatinine, Ser 2.70 (*)    GFR calc non Af Amer 18 (*)    GFR calc Af Amer 20 (*)    All other components within normal limits  CBC - Abnormal; Notable for the following:    RBC 3.34 (*)    Hemoglobin 10.6 (*)    HCT 30.7 (*)    All other components within normal limits  URINALYSIS, ROUTINE W REFLEX MICROSCOPIC (NOT AT [facility]) - Abnormal; Notable for the following:    Glucose, UA >1000 (*)    All other components within normal limits  URINE MICROSCOPIC-ADD ON - Abnormal; Notable for the following:    Squamous Epithelial / LPF 0-5 (*)  All other components within normal limits  CBG MONITORING, ED - Abnormal; Notable for the following:    Glucose-Capillary 465 (*)    All other components within normal limits  CBG MONITORING, ED - Abnormal; Notable for the following:    Glucose-Capillary 416 (*)    All other components within normal limits  CBG MONITORING, ED - Abnormal; Notable for the following:    Glucose-Capillary 318 (*)    All other components within normal limits    Imaging Review No results found. I have personally reviewed and evaluated these images and lab results as part of my medical decision-making.   EKG Interpretation   Date/Time:  Saturday January 12 2015 17:43:12 EST Ventricular Rate:  63 PR Interval:  172 QRS Duration: 95 QT Interval:  416 QTC Calculation: 426 R Axis:   28 Text Interpretation:  Sinus rhythm Baseline wander in lead(s) I II aVR No  significant change since last tracing Confirmed by [place]  ([id number]) on 01/13/2015 12:08:44 AM      MDM   Final diagnoses:  Hyperglycemia    Pt with hyperglycemia. No signs of infection. VS show hypertension, otherwise normal. CBG upon arrival 465. Will get labs, fluid bolus and insulin SQ ordered.   6:02 PM Patient's blood sugar is down to 318 after one bolus of saline and 6 units of insulin subcutaneous. Patient's blood work showed potassium 5.4, EKG obtained and  shows no evidence of hyperkalemia. Patient's creatinine is 2.7 which is slightly better than her baseline. Her blood sugar is 504. Hemoglobin is 10.6, close to baseline. Patient feels well. Her and her family admit to me that she has been eating a lot of bread and cookies and drinking nondiet sodas. We spoke at length about diet and what foods she could be eating. Will continue insulin with extra supplementation, and follow-up with primary care doctor. At this time do not think she needs an admission. Return precautions discussed.  Filed Vitals:   01/12/15 1324 01/12/15 1600 01/12/15 1630 01/12/15 1648  BP:  141/85 124/76   Pulse:   66 66  Temp:      TempSrc:      Resp:   18   Height:      Weight: 102.513 kg     SpO2:   98% 98%     Jeannett Senior, PA-C 01/12/15 1806  Jeannett Senior, PA-C 01/13/15 0006  Jeannett Senior, PA-C 01/13/15 0009  Gareth Morgan, MD 01/17/15 1343

## 2015-01-12 NOTE — Discharge Instructions (Signed)
Please watch your diet carefully. Continue insulin. Follow up with primary care doctor.   Basic Carbohydrate Counting for Diabetes Mellitus Carbohydrate counting is a method for keeping track of the amount of carbohydrates you eat. Eating carbohydrates naturally increases the level of sugar (glucose) in your blood, so it is important for you to know the amount that is okay for you to have in every meal. Carbohydrate counting helps keep the level of glucose in your blood within normal limits. The amount of carbohydrates allowed is different for every person. A dietitian can help you calculate the amount that is right for you. Once you know the amount of carbohydrates you can have, you can count the carbohydrates in the foods you want to eat. Carbohydrates are found in the following foods:  Grains, such as breads and cereals.  Dried beans and soy products.  Starchy vegetables, such as potatoes, peas, and corn.  Fruit and fruit juices.  Milk and yogurt.  Sweets and snack foods, such as cake, cookies, candy, chips, soft drinks, and fruit drinks. CARBOHYDRATE COUNTING There are two ways to count the carbohydrates in your food. You can use either of the methods or a combination of both. Reading the "Nutrition Facts" on [place] The "Nutrition Facts" is an area that is included on the labels of almost all packaged food and beverages in the [place]. It includes the serving size of that food or beverage and information about the nutrients in each serving of the food, including the grams (g) of carbohydrate per serving.  Decide the number of servings of this food or beverage that you will be able to eat or drink. Multiply that number of servings by the number of grams of carbohydrate that is listed on the label for that serving. The total will be the amount of carbohydrates you will be having when you eat or drink this food or beverage. Learning Standard Serving Sizes of Food When you eat  food that is not packaged or does not include "Nutrition Facts" on the label, you need to measure the servings in order to count the amount of carbohydrates.A serving of most carbohydrate-rich foods contains about 15 g of carbohydrates. The following list includes serving sizes of carbohydrate-rich foods that provide 15 g ofcarbohydrate per serving:   1 slice of bread (1 oz) or 1 six-inch tortilla.    of a hamburger bun or English muffin.  4-6 crackers.   cup unsweetened dry cereal.    cup hot cereal.   cup rice or pasta.    cup mashed potatoes or  of a large baked potato.  1 cup fresh fruit or one small piece of fruit.    cup canned or frozen fruit or fruit juice.  1 cup milk.   cup plain fat-free yogurt or yogurt sweetened with artificial sweeteners.   cup cooked dried beans or starchy vegetable, such as peas, corn, or potatoes.  Decide the number of standard-size servings that you will eat. Multiply that number of servings by 15 (the grams of carbohydrates in that serving). For example, if you eat 2 cups of strawberries, you will have eaten 2 servings and 30 g of carbohydrates (2 servings x 15 g = 30 g). For foods such as soups and casseroles, in which more than one food is mixed in, you will need to count the carbohydrates in each food that is included. EXAMPLE OF CARBOHYDRATE COUNTING Sample Dinner  3 oz chicken breast.   cup of brown rice.  cup of corn.  1 cup milk.   1 cup strawberries with sugar-free whipped topping.  Carbohydrate Calculation Step 1: Identify the foods that contain carbohydrates:   Rice.   Corn.   Milk.   Strawberries. Step 2:Calculate the number of servings eaten of each:   2 servings of rice.   1 serving of corn.   1 serving of milk.   1 serving of strawberries. Step 3: Multiply each of those number of servings by 15 g:   2 servings of rice x 15 g = 30 g.   1 serving of corn x 15 g = 15 g.   1  serving of milk x 15 g = 15 g.   1 serving of strawberries x 15 g = 15 g. Step 4: Add together all of the amounts to find the total grams of carbohydrates eaten: 30 g + 15 g + 15 g + 15 g = 75 g.   This information is not intended to replace advice given to you by your health care provider. Make sure you discuss any questions you have with your health care provider.   Document Released: 01/12/2005 Document Revised: 02/02/2014 Document Reviewed: 12/09/2012 Elsevier Interactive Patient Education 2016 [employer]. Diabetes Mellitus and Food It is important for you to manage your blood sugar (glucose) level. Your blood glucose level can be greatly affected by what you eat. Eating healthier foods in the appropriate amounts throughout the day at about the same time each day will help you control your blood glucose level. It can also help slow or prevent worsening of your diabetes mellitus. Healthy eating may even help you improve the level of your blood pressure and reach or maintain a healthy weight.  General recommendations for healthful eating and cooking habits include:  Eating meals and snacks regularly. Avoid going long periods of time without eating to lose weight.  Eating a diet that consists mainly of plant-based foods, such as fruits, vegetables, nuts, legumes, and whole grains.  Using low-heat cooking methods, such as baking, instead of high-heat cooking methods, such as deep frying. Work with your dietitian to make sure you understand how to use the Nutrition Facts information on food labels. HOW CAN FOOD AFFECT ME? Carbohydrates Carbohydrates affect your blood glucose level more than any other type of food. Your dietitian will help you determine how many carbohydrates to eat at each meal and teach you how to count carbohydrates. Counting carbohydrates is important to keep your blood glucose at a healthy level, especially if you are using insulin or taking certain medicines for diabetes  mellitus. Alcohol Alcohol can cause sudden decreases in blood glucose (hypoglycemia), especially if you use insulin or take certain medicines for diabetes mellitus. Hypoglycemia can be a life-threatening condition. Symptoms of hypoglycemia (sleepiness, dizziness, and disorientation) are similar to symptoms of having too much alcohol.  If your health care provider has given you approval to drink alcohol, do so in moderation and use the following guidelines:  Women should not have more than one drink per day, and men should not have more than two drinks per day. One drink is equal to:  12 oz of beer.  5 oz of wine.  1 oz of hard liquor.  Do not drink on an empty stomach.  Keep yourself hydrated. Have water, diet soda, or unsweetened iced tea.  Regular soda, juice, and other mixers might contain a lot of carbohydrates and should be counted. WHAT FOODS ARE NOT RECOMMENDED? As you make  food choices, it is important to remember that all foods are not the same. Some foods have fewer nutrients per serving than other foods, even though they might have the same number of calories or carbohydrates. It is difficult to get your body what it needs when you eat foods with fewer nutrients. Examples of foods that you should avoid that are high in calories and carbohydrates but low in nutrients include:  Trans fats (most processed foods list trans fats on the Nutrition Facts label).  Regular soda.  Juice.  Candy.  Sweets, such as cake, pie, doughnuts, and cookies.  Fried foods. WHAT FOODS CAN I EAT? Eat nutrient-rich foods, which will nourish your body and keep you healthy. The food you should eat also will depend on several factors, including:  The calories you need.  The medicines you take.  Your weight.  Your blood glucose level.  Your blood pressure level.  Your cholesterol level. You should eat a variety of foods, including:  Protein.  Lean cuts of meat.  Proteins low in  saturated fats, such as fish, egg whites, and beans. Avoid processed meats.  Fruits and vegetables.  Fruits and vegetables that may help control blood glucose levels, such as apples, mangoes, and yams.  Dairy products.  Choose fat-free or low-fat dairy products, such as milk, yogurt, and cheese.  Grains, bread, pasta, and rice.  Choose whole grain products, such as multigrain bread, whole oats, and brown rice. These foods may help control blood pressure.  Fats.  Foods containing healthful fats, such as nuts, avocado, olive oil, canola oil, and fish. DOES EVERYONE WITH DIABETES MELLITUS HAVE THE SAME MEAL PLAN? Because every person with diabetes mellitus is different, there is not one meal plan that works for everyone. It is very important that you meet with a dietitian who will help you create a meal plan that is just right for you.   This information is not intended to replace advice given to you by your health care provider. Make sure you discuss any questions you have with your health care provider.   Document Released: 10/09/2004 Document Revised: 02/02/2014 Document Reviewed: 12/09/2012 Elsevier Interactive Patient Education [employer].

## 2015-01-12 NOTE — ED Notes (Signed)
Patient given cheese,per Lahoma Rocker

## 2015-01-13 ENCOUNTER — Encounter (HOSPITAL_COMMUNITY): Payer: Self-pay

## 2015-01-13 ENCOUNTER — Emergency Department (HOSPITAL_COMMUNITY): Payer: Medicare Other

## 2015-01-13 ENCOUNTER — Inpatient Hospital Stay (HOSPITAL_COMMUNITY)
Admission: EM | Admit: 2015-01-13 | Discharge: 2015-01-18 | DRG: 065 | Disposition: A | Discharge status: Rehabilitation Facility | Payer: Medicare Other | Attending: Internal Medicine | Admitting: Internal Medicine

## 2015-01-13 DIAGNOSIS — G8191 Hemiplegia, unspecified affecting right dominant side: Secondary | ICD-10-CM | POA: Diagnosis present

## 2015-01-13 DIAGNOSIS — R29707 NIHSS score 7: Secondary | ICD-10-CM | POA: Diagnosis present

## 2015-01-13 DIAGNOSIS — G9389 Other specified disorders of brain: Secondary | ICD-10-CM | POA: Diagnosis present

## 2015-01-13 DIAGNOSIS — E870 Hyperosmolality and hypernatremia: Secondary | ICD-10-CM | POA: Diagnosis present

## 2015-01-13 DIAGNOSIS — I6930 Unspecified sequelae of cerebral infarction: Secondary | ICD-10-CM | POA: Diagnosis not present

## 2015-01-13 DIAGNOSIS — E785 Hyperlipidemia, unspecified: Secondary | ICD-10-CM | POA: Diagnosis present

## 2015-01-13 DIAGNOSIS — Z955 Presence of coronary angioplasty implant and graft: Secondary | ICD-10-CM

## 2015-01-13 DIAGNOSIS — E118 Type 2 diabetes mellitus with unspecified complications: Secondary | ICD-10-CM | POA: Diagnosis present

## 2015-01-13 DIAGNOSIS — D6959 Other secondary thrombocytopenia: Secondary | ICD-10-CM | POA: Diagnosis not present

## 2015-01-13 DIAGNOSIS — I2510 Atherosclerotic heart disease of native coronary artery without angina pectoris: Secondary | ICD-10-CM | POA: Diagnosis present

## 2015-01-13 DIAGNOSIS — M4806 Spinal stenosis, lumbar region: Secondary | ICD-10-CM | POA: Diagnosis not present

## 2015-01-13 DIAGNOSIS — I69320 Aphasia following cerebral infarction: Secondary | ICD-10-CM | POA: Insufficient documentation

## 2015-01-13 DIAGNOSIS — I69351 Hemiplegia and hemiparesis following cerebral infarction affecting right dominant side: Secondary | ICD-10-CM | POA: Diagnosis present

## 2015-01-13 DIAGNOSIS — Z6837 Body mass index (BMI) 37.0-37.9, adult: Secondary | ICD-10-CM

## 2015-01-13 DIAGNOSIS — I6322 Cerebral infarction due to unspecified occlusion or stenosis of basilar arteries: Secondary | ICD-10-CM | POA: Diagnosis not present

## 2015-01-13 DIAGNOSIS — R1310 Dysphagia, unspecified: Secondary | ICD-10-CM | POA: Diagnosis present

## 2015-01-13 DIAGNOSIS — E1165 Type 2 diabetes mellitus with hyperglycemia: Secondary | ICD-10-CM | POA: Diagnosis present

## 2015-01-13 DIAGNOSIS — R531 Weakness: Secondary | ICD-10-CM | POA: Diagnosis not present

## 2015-01-13 DIAGNOSIS — M419 Scoliosis, unspecified: Secondary | ICD-10-CM | POA: Diagnosis not present

## 2015-01-13 DIAGNOSIS — E872 Acidosis: Secondary | ICD-10-CM | POA: Diagnosis present

## 2015-01-13 DIAGNOSIS — D696 Thrombocytopenia, unspecified: Secondary | ICD-10-CM | POA: Diagnosis not present

## 2015-01-13 DIAGNOSIS — D649 Anemia, unspecified: Secondary | ICD-10-CM | POA: Diagnosis present

## 2015-01-13 DIAGNOSIS — G459 Transient cerebral ischemic attack, unspecified: Secondary | ICD-10-CM | POA: Diagnosis not present

## 2015-01-13 DIAGNOSIS — J9611 Chronic respiratory failure with hypoxia: Secondary | ICD-10-CM | POA: Diagnosis present

## 2015-01-13 DIAGNOSIS — M6289 Other specified disorders of muscle: Secondary | ICD-10-CM | POA: Diagnosis not present

## 2015-01-13 DIAGNOSIS — Z7902 Long term (current) use of antithrombotics/antiplatelets: Secondary | ICD-10-CM

## 2015-01-13 DIAGNOSIS — I639 Cerebral infarction, unspecified: Secondary | ICD-10-CM | POA: Diagnosis present

## 2015-01-13 DIAGNOSIS — Z7951 Long term (current) use of inhaled steroids: Secondary | ICD-10-CM | POA: Diagnosis not present

## 2015-01-13 DIAGNOSIS — G8929 Other chronic pain: Secondary | ICD-10-CM | POA: Diagnosis present

## 2015-01-13 DIAGNOSIS — N185 Chronic kidney disease, stage 5: Secondary | ICD-10-CM | POA: Diagnosis not present

## 2015-01-13 DIAGNOSIS — Z7982 Long term (current) use of aspirin: Secondary | ICD-10-CM | POA: Diagnosis not present

## 2015-01-13 DIAGNOSIS — I6789 Other cerebrovascular disease: Secondary | ICD-10-CM | POA: Diagnosis not present

## 2015-01-13 DIAGNOSIS — I5042 Chronic combined systolic (congestive) and diastolic (congestive) heart failure: Secondary | ICD-10-CM | POA: Diagnosis present

## 2015-01-13 DIAGNOSIS — Z880 Allergy status to penicillin: Secondary | ICD-10-CM | POA: Diagnosis not present

## 2015-01-13 DIAGNOSIS — Z8673 Personal history of transient ischemic attack (TIA), and cerebral infarction without residual deficits: Secondary | ICD-10-CM | POA: Diagnosis not present

## 2015-01-13 DIAGNOSIS — Z85038 Personal history of other malignant neoplasm of large intestine: Secondary | ICD-10-CM

## 2015-01-13 DIAGNOSIS — I69359 Hemiplegia and hemiparesis following cerebral infarction affecting unspecified side: Secondary | ICD-10-CM

## 2015-01-13 DIAGNOSIS — R5383 Other fatigue: Secondary | ICD-10-CM | POA: Diagnosis not present

## 2015-01-13 DIAGNOSIS — I252 Old myocardial infarction: Secondary | ICD-10-CM

## 2015-01-13 DIAGNOSIS — I63039 Cerebral infarction due to thrombosis of unspecified carotid artery: Secondary | ICD-10-CM | POA: Diagnosis not present

## 2015-01-13 DIAGNOSIS — N179 Acute kidney failure, unspecified: Secondary | ICD-10-CM | POA: Diagnosis present

## 2015-01-13 DIAGNOSIS — M1990 Unspecified osteoarthritis, unspecified site: Secondary | ICD-10-CM | POA: Diagnosis present

## 2015-01-13 DIAGNOSIS — R0989 Other specified symptoms and signs involving the circulatory and respiratory systems: Secondary | ICD-10-CM | POA: Insufficient documentation

## 2015-01-13 DIAGNOSIS — R4182 Altered mental status, unspecified: Secondary | ICD-10-CM | POA: Diagnosis not present

## 2015-01-13 DIAGNOSIS — N189 Chronic kidney disease, unspecified: Secondary | ICD-10-CM

## 2015-01-13 DIAGNOSIS — E1122 Type 2 diabetes mellitus with diabetic chronic kidney disease: Secondary | ICD-10-CM | POA: Diagnosis present

## 2015-01-13 DIAGNOSIS — I69391 Dysphagia following cerebral infarction: Secondary | ICD-10-CM

## 2015-01-13 DIAGNOSIS — Z87891 Personal history of nicotine dependence: Secondary | ICD-10-CM | POA: Diagnosis not present

## 2015-01-13 DIAGNOSIS — I130 Hypertensive heart and chronic kidney disease with heart failure and stage 1 through stage 4 chronic kidney disease, or unspecified chronic kidney disease: Secondary | ICD-10-CM | POA: Diagnosis present

## 2015-01-13 DIAGNOSIS — N184 Chronic kidney disease, stage 4 (severe): Secondary | ICD-10-CM | POA: Diagnosis present

## 2015-01-13 DIAGNOSIS — E662 Morbid (severe) obesity with alveolar hypoventilation: Secondary | ICD-10-CM | POA: Diagnosis present

## 2015-01-13 DIAGNOSIS — J449 Chronic obstructive pulmonary disease, unspecified: Secondary | ICD-10-CM | POA: Insufficient documentation

## 2015-01-13 DIAGNOSIS — I5032 Chronic diastolic (congestive) heart failure: Secondary | ICD-10-CM | POA: Diagnosis not present

## 2015-01-13 DIAGNOSIS — R4701 Aphasia: Secondary | ICD-10-CM | POA: Diagnosis present

## 2015-01-13 DIAGNOSIS — Z794 Long term (current) use of insulin: Secondary | ICD-10-CM | POA: Diagnosis not present

## 2015-01-13 DIAGNOSIS — R410 Disorientation, unspecified: Secondary | ICD-10-CM | POA: Diagnosis not present

## 2015-01-13 DIAGNOSIS — R471 Dysarthria and anarthria: Secondary | ICD-10-CM | POA: Diagnosis present

## 2015-01-13 DIAGNOSIS — M549 Dorsalgia, unspecified: Secondary | ICD-10-CM | POA: Diagnosis present

## 2015-01-13 DIAGNOSIS — R1110 Vomiting, unspecified: Secondary | ICD-10-CM

## 2015-01-13 DIAGNOSIS — Z9981 Dependence on supplemental oxygen: Secondary | ICD-10-CM | POA: Diagnosis not present

## 2015-01-13 DIAGNOSIS — R4781 Slurred speech: Secondary | ICD-10-CM | POA: Diagnosis not present

## 2015-01-13 DIAGNOSIS — K5900 Constipation, unspecified: Secondary | ICD-10-CM | POA: Diagnosis not present

## 2015-01-13 DIAGNOSIS — Z79899 Other long term (current) drug therapy: Secondary | ICD-10-CM

## 2015-01-13 DIAGNOSIS — F0390 Unspecified dementia without behavioral disturbance: Secondary | ICD-10-CM | POA: Diagnosis not present

## 2015-01-13 DIAGNOSIS — R739 Hyperglycemia, unspecified: Secondary | ICD-10-CM

## 2015-01-13 DIAGNOSIS — E1142 Type 2 diabetes mellitus with diabetic polyneuropathy: Secondary | ICD-10-CM | POA: Diagnosis not present

## 2015-01-13 DIAGNOSIS — I25119 Atherosclerotic heart disease of native coronary artery with unspecified angina pectoris: Secondary | ICD-10-CM | POA: Diagnosis not present

## 2015-01-13 DIAGNOSIS — I63212 Cerebral infarction due to unspecified occlusion or stenosis of left vertebral arteries: Secondary | ICD-10-CM | POA: Diagnosis not present

## 2015-01-13 DIAGNOSIS — J441 Chronic obstructive pulmonary disease with (acute) exacerbation: Secondary | ICD-10-CM | POA: Diagnosis present

## 2015-01-13 DIAGNOSIS — G4733 Obstructive sleep apnea (adult) (pediatric): Secondary | ICD-10-CM | POA: Diagnosis not present

## 2015-01-13 DIAGNOSIS — I5022 Chronic systolic (congestive) heart failure: Secondary | ICD-10-CM | POA: Diagnosis not present

## 2015-01-13 DIAGNOSIS — I69322 Dysarthria following cerebral infarction: Secondary | ICD-10-CM | POA: Diagnosis not present

## 2015-01-13 DIAGNOSIS — T45515A Adverse effect of anticoagulants, initial encounter: Secondary | ICD-10-CM | POA: Diagnosis not present

## 2015-01-13 LAB — URINALYSIS, ROUTINE W REFLEX MICROSCOPIC
Bilirubin Urine: NEGATIVE
Glucose, UA: 500 mg/dL — AB
Hgb urine dipstick: NEGATIVE
Ketones, ur: NEGATIVE mg/dL
Nitrite: NEGATIVE
Protein, ur: NEGATIVE mg/dL
Specific Gravity, Urine: 1.017 (ref 1.005–1.030)
pH: 5 (ref 5.0–8.0)

## 2015-01-13 LAB — I-STAT TROPONIN, ED: Troponin i, poc: 0.01 ng/mL (ref 0.00–0.08)

## 2015-01-13 LAB — ETHANOL: Alcohol, Ethyl (B): <5 mg/dL (ref <5)

## 2015-01-13 LAB — COMPREHENSIVE METABOLIC PANEL
ALT: 15 U/L (ref 14–54)
AST: 13 U/L — ABNORMAL LOW (ref 15–41)
Albumin: 3.5 g/dL (ref 3.5–5.0)
Alkaline Phosphatase: 80 U/L (ref 38–126)
Anion gap: 9 (ref 5–15)
BUN: 61 mg/dL — ABNORMAL HIGH (ref 6–20)
CO2: 17 mmol/L — ABNORMAL LOW (ref 22–32)
Calcium: 9.3 mg/dL (ref 8.9–10.3)
Chloride: 109 mmol/L (ref 101–111)
Creatinine, Ser: 2.69 mg/dL — ABNORMAL HIGH (ref 0.44–1.00)
GFR calc Af Amer: 21 mL/min — ABNORMAL LOW (ref >60)
GFR calc non Af Amer: 18 mL/min — ABNORMAL LOW (ref >60)
Glucose, Bld: 258 mg/dL — ABNORMAL HIGH (ref 65–99)
Potassium: 4.3 mmol/L (ref 3.5–5.1)
Sodium: 135 mmol/L (ref 135–145)
Total Bilirubin: 0.4 mg/dL (ref 0.3–1.2)
Total Protein: 6.8 g/dL (ref 6.5–8.1)

## 2015-01-13 LAB — RAPID URINE DRUG SCREEN, HOSP PERFORMED
Amphetamines: NOT DETECTED
Barbiturates: NOT DETECTED
Benzodiazepines: NOT DETECTED
Cocaine: NOT DETECTED
Opiates: NOT DETECTED
Tetrahydrocannabinol: NOT DETECTED

## 2015-01-13 LAB — DIFFERENTIAL
Basophils Absolute: 0 10*3/uL (ref 0.0–0.1)
Basophils Relative: 0 %
Eosinophils Absolute: 0.2 10*3/uL (ref 0.0–0.7)
Eosinophils Relative: 2 %
Lymphocytes Relative: 30 %
Lymphs Abs: 2.3 10*3/uL (ref 0.7–4.0)
Monocytes Absolute: 0.4 10*3/uL (ref 0.1–1.0)
Monocytes Relative: 6 %
Neutro Abs: 4.5 10*3/uL (ref 1.7–7.7)
Neutrophils Relative %: 62 %

## 2015-01-13 LAB — CBC
HCT: 30.3 % — ABNORMAL LOW (ref 36.0–46.0)
Hemoglobin: 10.2 g/dL — ABNORMAL LOW (ref 12.0–15.0)
MCH: 31.4 pg (ref 26.0–34.0)
MCHC: 33.7 g/dL (ref 30.0–36.0)
MCV: 93.2 fL (ref 78.0–100.0)
Platelets: 167 10*3/uL (ref 150–400)
RBC: 3.25 MIL/uL — ABNORMAL LOW (ref 3.87–5.11)
RDW: 15.3 % (ref 11.5–15.5)
WBC: 7.4 10*3/uL (ref 4.0–10.5)

## 2015-01-13 LAB — URINE MICROSCOPIC-ADD ON

## 2015-01-13 LAB — PROTIME-INR
INR: 1.08 (ref 0.00–1.49)
Prothrombin Time: 14.2 seconds (ref 11.6–15.2)

## 2015-01-13 LAB — APTT: aPTT: 26 seconds (ref 24–37)

## 2015-01-13 LAB — CBG MONITORING, ED: Glucose-Capillary: 247 mg/dL — ABNORMAL HIGH (ref 65–99)

## 2015-01-13 NOTE — ED Provider Notes (Signed)
CSN: [id number]     Arrival date & time 01/13/15  2110 History   First MD Initiated Contact with Patient 01/13/15 2113     Chief Complaint  Patient presents with  . Extremity Weakness     (Consider location/radiation/quality/duration/timing/severity/associated sxs/prior Treatment) HPI Comments: 64 year old female with a history of coronary artery disease, diabetes, COPD, CKD, CVA with dysarthria and right-sided weakness with resolution of symptoms and no lasting deficits (2014) presents with concern of right sided weakness and dysarthria. On my history, patient reports that symptoms began about 10:30 PM. Patient was in the emergency department for hyperglycemia yesterday, and when I discussed this with her she reports the symptoms began after she returned home.  Her daughter reports that she had been having symptoms yesterday and presented to the emergency department for concern of high glucose and worsening right sided weakness.  Reports that in the past patient has had hyperglycemia which has exacerbated her prior strokelike symptoms, and her glucose levels were running high yesterday so she presented to the emergency department for concern of high glucose causing the symptoms. Per daughter patient's symptoms were not resolved prior to returning home and she was brought back to the emergency department today for concern of continuing right-sided weakness in setting of normal glucose levels.   Patient is a 64 y.o. female presenting with extremity weakness.  Extremity Weakness This is a new problem. The current episode started yesterday. The problem occurs constantly. The problem has not changed since onset.Pertinent negatives include no chest pain, no abdominal pain, no headaches and no shortness of breath. Nothing aggravates the symptoms. Nothing relieves the symptoms. The treatment provided no relief.    Past Medical History  Diagnosis Date  . Emphysema   . Colon cancer ([place])   .  Hypertension   . Hypercholesteremia   . Diabetes mellitus   . COPD (chronic obstructive pulmonary disease) ([place])   . Coronary artery disease   . Obesity   . Sleep apnea   . TIA (transient ischemic attack)   . CHF (congestive heart failure) ([place])   . Anemia   . DJD (degenerative joint disease)   . Renal insufficiency   . Chronic back pain   . Scoliosis   . Obesity hypoventilation syndrome ([place])   . Stroke ([place])   . Myocardial infarction [facility])    Past Surgical History  Procedure Laterality Date  . Coronary angioplasty with stent placement    . Colon surgery     History reviewed. No pertinent family history. Social History  Substance Use Topics  . Smoking status: Former Research scientist (life sciences)  . Smokeless tobacco: None  . Alcohol Use: No   OB History    No data available     Review of Systems  Constitutional: Negative for fever.  HENT: Negative for sore throat.   Eyes: Negative for visual disturbance.  Respiratory: Negative for cough and shortness of breath.   Cardiovascular: Negative for chest pain.  Gastrointestinal: Negative for nausea, vomiting and abdominal pain.  Genitourinary: Negative for difficulty urinating.  Musculoskeletal: Positive for extremity weakness. Negative for back pain and neck pain.  Skin: Negative for rash.  Neurological: Positive for speech difficulty and weakness. Negative for dizziness, tremors, syncope, facial asymmetry, light-headedness, numbness and headaches.      Allergies  Penicillins  Home Medications   Prior to Admission medications   Medication Sig Start Date End Date Taking? Authorizing Provider  ADVAIR DISKUS 250-50 MCG/DOSE AEPB Inhale 1 puff into the lungs 2 (two) times  daily.  08/22/14  Yes Historical Provider, MD  alprazolam Duanne Moron) 2 MG tablet Take 4 mg by mouth at bedtime.   Yes Historical Provider, MD  amLODipine (NORVASC) 10 MG tablet Take 10 mg by mouth daily.  08/22/14  Yes Historical Provider, MD  aspirin EC 81 MG EC tablet Take 1  tablet (81 mg total) by mouth daily. 06/22/14  Yes Charolette Forward, MD  atorvastatin (LIPITOR) 40 MG tablet Take 40 mg by mouth daily. 07/25/14  Yes Historical Provider, MD  clopidogrel (PLAVIX) 75 MG tablet Take 75 mg by mouth daily. 07/25/14  Yes Historical Provider, MD  ferrous sulfate 325 (65 FE) MG tablet Take 1 tablet (325 mg total) by mouth 2 (two) times daily with a meal. 06/22/14 08/18/17 Yes Charolette Forward, MD  gemfibrozil (LOPID) 600 MG tablet Take 600 mg by mouth 2 (two) times daily before a meal.   Yes Historical Provider, MD  insulin glargine (LANTUS) 100 UNIT/ML injection Inject 0.6 mLs (60 Units total) into the skin 2 (two) times daily. 06/22/14  Yes Charolette Forward, MD  losartan (COZAAR) 100 MG tablet Take 100 mg by mouth daily.  12/01/10  Yes Historical Provider, MD  LYRICA 50 MG capsule Take 50 mg by mouth 2 (two) times daily. 08/01/14  Yes Historical Provider, MD  metoprolol succinate (TOPROL-XL) 100 MG 24 hr tablet Take 100 mg by mouth daily. Take with or immediately following a meal.   Yes Historical Provider, MD  ondansetron (ZOFRAN) 4 MG tablet Take 1 tablet (4 mg total) by mouth every 6 (six) hours. Patient taking differently: Take 4 mg by mouth every 8 (eight) hours as needed for nausea.  [id number]  Yes Delora Fuel, MD  OxyCODONE (OXYCONTIN) 80 mg T12A 12 hr tablet Take 80 mg by mouth 2 (two) times daily as needed (pain).  08/15/14  Yes Historical Provider, MD  pantoprazole (PROTONIX) 40 MG tablet Take 40 mg by mouth daily. 06/04/14  Yes Historical Provider, MD  predniSONE (DELTASONE) 5 MG tablet Tapering dose for 12 days 12-14 to 12-26  6 for 2 days, 5 for 2 days, 4 for 2 days, 3 for 2 days, 2 for 2 days, 1 for 2 days 01/09/15  Yes Historical Provider, MD  rosuvastatin (CRESTOR) 10 MG tablet Take 10 mg by mouth daily.  12/01/10  Yes Historical Provider, MD  traMADol (ULTRAM) 50 MG tablet Take 50 mg by mouth every 4 (four) hours as needed for moderate pain.  01/09/15  Yes Historical Provider,  MD   BP 175/92 mmHg  Pulse 76  Temp(Src) 98.2 F (36.8 C) (Oral)  Resp 16  Ht 5\' 5"  (1.651 m)  Wt 227 lb 8 oz (103.193 kg)  BMI 37.86 kg/m2  SpO2 100% Physical Exam  Constitutional: She is oriented to person, place, and time. She appears well-developed and well-nourished. No distress.  HENT:  Head: Normocephalic and atraumatic.  Eyes: Conjunctivae and EOM are normal.  Neck: Normal range of motion.  Cardiovascular: Normal rate, regular rhythm, normal heart sounds and intact distal pulses.  Exam reveals no gallop and no friction rub.   No murmur heard. Pulmonary/Chest: Effort normal and breath sounds normal. No respiratory distress. She has no wheezes. She has no rales.  Abdominal: Soft. She exhibits no distension. There is no tenderness. There is no guarding.  Musculoskeletal: She exhibits no edema or tenderness.  Neurological: She is alert and oriented to person, place, and time. A cranial nerve deficit (tongue deviation towards right, dysarthria) is present. No  sensory deficit. Coordination (unable to test on right secondary to weakness) normal.  3-4/5 weakness in right upper and right lower extremity   Skin: Skin is warm and dry. No rash noted. She is not diaphoretic. No erythema.  Nursing note and vitals reviewed.   ED Course  Procedures (including critical care time) Labs Review Labs Reviewed  CBC - Abnormal; Notable for the following:    RBC 3.25 (*)    Hemoglobin 10.2 (*)    HCT 30.3 (*)    All other components within normal limits  COMPREHENSIVE METABOLIC PANEL - Abnormal; Notable for the following:    CO2 17 (*)    Glucose, Bld 258 (*)    BUN 61 (*)    Creatinine, Ser 2.69 (*)    AST 13 (*)    GFR calc non Af Amer 18 (*)    GFR calc Af Amer 21 (*)    All other components within normal limits  URINALYSIS, ROUTINE W REFLEX MICROSCOPIC (NOT AT [facility]) - Abnormal; Notable for the following:    APPearance CLOUDY (*)    Glucose, UA 500 (*)    Leukocytes, UA SMALL (*)     All other components within normal limits  URINE MICROSCOPIC-ADD ON - Abnormal; Notable for the following:    Squamous Epithelial / LPF 0-5 (*)    Bacteria, UA FEW (*)    All other components within normal limits  CBG MONITORING, ED - Abnormal; Notable for the following:    Glucose-Capillary 247 (*)    All other components within normal limits  ETHANOL  PROTIME-INR  APTT  DIFFERENTIAL  URINE RAPID DRUG SCREEN, HOSP PERFORMED  HEMOGLOBIN A1C  COMPREHENSIVE METABOLIC PANEL  CBC  LIPID PANEL  I-STAT TROPOININ, ED    Imaging Review Ct Head Wo Contrast  01/13/2015  CLINICAL DATA:  Right-sided weakness. EXAM: CT HEAD WITHOUT CONTRAST TECHNIQUE: Contiguous axial images were obtained from the base of the skull through the vertex without intravenous contrast. COMPARISON:  02/07/2012 FINDINGS: There is encephalomalacia within the left frontal lobe compatible with chronic infarct. This is new since 02/07/2012. Patchy areas of low-attenuation are identified 2 areas of subcortical low attenuation within the posterior right parietal lobe are identified and appear unchanged from previous exam. Prominence of the sulci and ventricles noted compatible with mild brain atrophy. No evidence for acute intracranial hemorrhage, mass or acute brain infarct. No abnormal extra-axial fluid collections identified. The paranasal sinuses and mastoid air cells are clear. The calvarium is intact. IMPRESSION: 1. No acute intracranial abnormalities. 2. Left frontal lobe encephalomalacia compatible with chronic infarct. New from previous exam. 3. Small vessel ischemic change, similar to previous exam. Electronically Signed   By: Kerby Moors M.D.   On: 01/13/2015 22:04   I have personally reviewed and evaluated these images and lab results as part of my medical decision-making.   EKG Interpretation None      MDM   Final diagnoses:  Right sided weakness  Dysarthria   64 year old female with a history of  coronary artery disease, diabetes, COPD, CKD, CVA with dysarthria and right-sided weakness with resolution of symptoms and no lasting deficits (2014) presents with concern of right sided weakness and dysarthria, with last known normal 1030PM per patient. Patient's exam concerning for acute CVA, or stroke reactivation given similar deficits in the past.  Glucose 247. Patient denies any other infectious symptoms, and urinalysis is not convincing for UTI. CT head done which shows no acute intracranial abnormalities. Neurology was consulted  for concern of possible acute stroke versus stroke reactivation. Patient is out of window for intervention. Plan is to admit patient to hospitalist for continued evaluation and obtain MRI.  Discussed plan with family who states understanding.   Gareth Morgan, MD 01/14/15 [id number]

## 2015-01-13 NOTE — Consult Note (Signed)
Admission H&P    Chief Complaint: New onset right-sided weakness and slurred speech.  HPI: Michelle Holmes is an 64 y.o. female with history of diabetes mellitus, hypertension, hypercholesterolemia, COPD, previous stroke, and coronary artery disease, presenting with exacerbation of weakness involving her right side starting at 10:30 PM last night. Patient has also had abated blood sugar, including a level of 504 in the emergency room today. His been taking aspirin and Plavix daily. CT scan of her head acute changes. Frontal encephalomalacia from previous stroke was noted. NIH stroke score was 7.  LSN: 10:30 PM on 01/12/2015 tPA Given: No: Beyond time under for treatment consideration mRankin:  Past Medical History  Diagnosis Date  . Emphysema   . Colon cancer ([place])   . Hypertension   . Hypercholesteremia   . Diabetes mellitus   . COPD (chronic obstructive pulmonary disease) ([place])   . Coronary artery disease   . Obesity   . Sleep apnea   . TIA (transient ischemic attack)   . CHF (congestive heart failure) ([place])   . Anemia   . DJD (degenerative joint disease)   . Renal insufficiency   . Chronic back pain   . Scoliosis   . Obesity hypoventilation syndrome ([place])   . Stroke ([place])   . Myocardial infarction [facility])     Past Surgical History  Procedure Laterality Date  . Coronary angioplasty with stent placement    . Colon surgery      History reviewed. No pertinent family history. Social History:  reports that she has quit smoking. She does not have any smokeless tobacco history on file. She reports that she does not drink alcohol or use illicit drugs.  Allergies:  Allergies  Allergen Reactions  . Penicillins Hives and Swelling    Has patient had a PCN reaction causing immediate rash, facial/tongue/throat swelling, SOB or lightheadedness with hypotension: yes Has patient had a PCN reaction causing severe rash involving mucus membranes or skin necrosis: no Has patient had a PCN  reaction that required hospitalization no Has patient had a PCN reaction occurring within the last 10 years: no If all of the above answers are "NO", then may proceed with Cephalosporin use.     Medications: Patient's preadmission medications were reviewed by me.  ROS: History obtained from patient and patient's daughter.  General ROS: negative for - chills, fatigue, fever, night sweats, weight gain or weight loss Psychological ROS: negative for - behavioral disorder, hallucinations, memory difficulties, mood swings or suicidal ideation Ophthalmic ROS: negative for - blurry vision, double vision, eye pain or loss of vision ENT ROS: negative for - epistaxis, nasal discharge, oral lesions, sore throat, tinnitus or vertigo Allergy and Immunology ROS: negative for - hives or itchy/watery eyes Hematological and Lymphatic ROS: negative for - bleeding problems, bruising or swollen lymph nodes Endocrine ROS: negative for - galactorrhea, hair pattern changes, polydipsia/polyuria or temperature intolerance Respiratory ROS: negative for - cough, hemoptysis, shortness of breath or wheezing Cardiovascular ROS: negative for - chest pain, dyspnea on exertion, edema or irregular heartbeat Gastrointestinal ROS: negative for - abdominal pain, diarrhea, hematemesis, nausea/vomiting or stool incontinence Genito-Urinary ROS: negative for - dysuria, hematuria, incontinence or urinary frequency/urgency Musculoskeletal ROS: negative for - joint swelling or muscular weakness Neurological ROS: as noted in HPI Dermatological ROS: negative for rash and skin lesion changes  Physical Examination: Blood pressure 146/88, pulse 67, temperature 98.2 F (36.8 C), temperature source Oral, resp. rate 15, SpO2 100 %.  HEENT-  Normocephalic, no lesions,  without obvious abnormality.  Normal external eye and conjunctiva.  Normal TM's bilaterally.  Normal auditory canals and external ears. Normal external nose, mucus membranes  and septum.  Normal pharynx. Neck supple with no masses, nodes, nodules or enlargement. Cardiovascular - regular rate and rhythm, S1, S2 normal, no murmur, click, rub or gallop Lungs - chest clear, no wheezing, rales, normal symmetric air entry Abdomen - soft, non-tender; bowel sounds normal; no masses,  no organomegaly Extremities - no joint deformities, effusion, or inflammation  Neurologic Examination: Mental Status: Alert, oriented, thought content appropriate.  Speech slightly slurred without evidence of aphasia. Able to follow commands without difficulty. Cranial Nerves: II-Visual fields were normal. III/IV/VI-Pupils were equal and reacted normally to light. Extraocular movements were full and conjugate.    V/VII-no facial numbness; mild right lower facial weakness. VIII-normal. X-normal speech and symmetrical palatal movement. XI: trapezius strength/neck flexion strength normal bilaterally XII-midline tongue extension with normal strength. Motor: Moderately severe weakness of right upper and lower extremities proximally and distally, with flaccid muscle tone: Normal strength and tone of left extremities. Sensory: Reduced perception of all modalities distally in both lower extremities. Deep Tendon Reflexes: Absent throughout. Plantars: Mute bilaterally Cerebellar: Normal finger-to-nose testing impaired with use of right upper extremity, commensurate with the very weakness. Carotid auscultation: Normal  Results for orders placed or performed during the hospital encounter of 01/13/15 (from the past 48 hour(s))  CBG monitoring, ED     Status: Abnormal   Collection Time: 01/13/15  9:21 PM  Result Value Ref Range   Glucose-Capillary 247 (H) 65 - 99 mg/dL  Protime-INR     Status: None   Collection Time: 01/13/15  9:33 PM  Result Value Ref Range   Prothrombin Time 14.2 11.6 - 15.2 seconds   INR 1.08 0.00 - 1.49  APTT     Status: None   Collection Time: 01/13/15  9:33 PM  Result  Value Ref Range   aPTT 26 24 - 37 seconds  CBC     Status: Abnormal   Collection Time: 01/13/15  9:33 PM  Result Value Ref Range   WBC 7.4 4.0 - 10.5 K/uL   RBC 3.25 (L) 3.87 - 5.11 MIL/uL   Hemoglobin 10.2 (L) 12.0 - 15.0 g/dL   HCT 30.3 (L) 36.0 - 46.0 %   MCV 93.2 78.0 - 100.0 fL   MCH 31.4 26.0 - 34.0 pg   MCHC 33.7 30.0 - 36.0 g/dL   RDW 15.3 11.5 - 15.5 %   Platelets 167 150 - 400 K/uL  Differential     Status: None   Collection Time: 01/13/15  9:33 PM  Result Value Ref Range   Neutrophils Relative % 62 %   Neutro Abs 4.5 1.7 - 7.7 K/uL   Lymphocytes Relative 30 %   Lymphs Abs 2.3 0.7 - 4.0 K/uL   Monocytes Relative 6 %   Monocytes Absolute 0.4 0.1 - 1.0 K/uL   Eosinophils Relative 2 %   Eosinophils Absolute 0.2 0.0 - 0.7 K/uL   Basophils Relative 0 %   Basophils Absolute 0.0 0.0 - 0.1 K/uL  Comprehensive metabolic panel     Status: Abnormal   Collection Time: 01/13/15  9:33 PM  Result Value Ref Range   Sodium 135 135 - 145 mmol/L   Potassium 4.3 3.5 - 5.1 mmol/L   Chloride 109 101 - 111 mmol/L   CO2 17 (L) 22 - 32 mmol/L   Glucose, Bld 258 (H) 65 - 99 mg/dL  BUN 61 (H) 6 - 20 mg/dL   Creatinine, Ser 2.69 (H) 0.44 - 1.00 mg/dL   Calcium 9.3 8.9 - 10.3 mg/dL   Total Protein 6.8 6.5 - 8.1 g/dL   Albumin 3.5 3.5 - 5.0 g/dL   AST 13 (L) 15 - 41 U/L   ALT 15 14 - 54 U/L   Alkaline Phosphatase 80 38 - 126 U/L   Total Bilirubin 0.4 0.3 - 1.2 mg/dL   GFR calc non Af Amer 18 (L) >60 mL/min   GFR calc Af Amer 21 (L) >60 mL/min    Comment: (NOTE) The eGFR has been calculated using the CKD EPI equation. This calculation has not been validated in all clinical situations. eGFR's persistently <60 mL/min signify possible Chronic Kidney Disease.    Anion gap 9 5 - 15  Ethanol     Status: None   Collection Time: 01/13/15  9:45 PM  Result Value Ref Range   Alcohol, Ethyl (B) <5 <5 mg/dL    Comment:        LOWEST DETECTABLE LIMIT FOR SERUM ALCOHOL IS 5 mg/dL FOR MEDICAL  PURPOSES ONLY   I-stat troponin, ED (not at [facility], [facility])     Status: None   Collection Time: 01/13/15 10:05 PM  Result Value Ref Range   Troponin i, poc 0.01 0.00 - 0.08 ng/mL   Comment 3            Comment: Due to the release kinetics of cTnI, a negative result within the first hours of the onset of symptoms does not rule out myocardial infarction with certainty. If myocardial infarction is still suspected, repeat the test at appropriate intervals.   Urine rapid drug screen (hosp performed)not at [facility]     Status: None   Collection Time: 01/13/15 10:38 PM  Result Value Ref Range   Opiates NONE DETECTED NONE DETECTED   Cocaine NONE DETECTED NONE DETECTED   Benzodiazepines NONE DETECTED NONE DETECTED   Amphetamines NONE DETECTED NONE DETECTED   Tetrahydrocannabinol NONE DETECTED NONE DETECTED   Barbiturates NONE DETECTED NONE DETECTED    Comment:        DRUG SCREEN FOR MEDICAL PURPOSES ONLY.  IF CONFIRMATION IS NEEDED FOR ANY PURPOSE, NOTIFY LAB WITHIN 5 DAYS.        LOWEST DETECTABLE LIMITS FOR URINE DRUG SCREEN Drug Class       Cutoff (ng/mL) Amphetamine      1000 Barbiturate      200 Benzodiazepine   573 Tricyclics       220 Opiates          300 Cocaine          300 THC              50   Urinalysis, Routine w reflex microscopic (not at [facility])     Status: Abnormal   Collection Time: 01/13/15 10:38 PM  Result Value Ref Range   Color, Urine YELLOW YELLOW   APPearance CLOUDY (A) CLEAR   Specific Gravity, Urine 1.017 1.005 - 1.030   pH 5.0 5.0 - 8.0   Glucose, UA 500 (A) NEGATIVE mg/dL   Hgb urine dipstick NEGATIVE NEGATIVE   Bilirubin Urine NEGATIVE NEGATIVE   Ketones, ur NEGATIVE NEGATIVE mg/dL   Protein, ur NEGATIVE NEGATIVE mg/dL   Nitrite NEGATIVE NEGATIVE   Leukocytes, UA SMALL (A) NEGATIVE  Urine microscopic-add on     Status: Abnormal   Collection Time: 01/13/15 10:38 PM  Result Value Ref Range  Squamous Epithelial / LPF 0-5 (A) NONE SEEN   WBC, UA 6-30 0 -  5 WBC/hpf   RBC / HPF 0-5 0 - 5 RBC/hpf   Bacteria, UA FEW (A) NONE SEEN   Urine-Other YEAST PRESENT    Ct Head Wo Contrast  01/13/2015  CLINICAL DATA:  Right-sided weakness. EXAM: CT HEAD WITHOUT CONTRAST TECHNIQUE: Contiguous axial images were obtained from the base of the skull through the vertex without intravenous contrast. COMPARISON:  02/07/2012 FINDINGS: There is encephalomalacia within the left frontal lobe compatible with chronic infarct. This is new since 02/07/2012. Patchy areas of low-attenuation are identified 2 areas of subcortical low attenuation within the posterior right parietal lobe are identified and appear unchanged from previous exam. Prominence of the sulci and ventricles noted compatible with mild brain atrophy. No evidence for acute intracranial hemorrhage, mass or acute brain infarct. No abnormal extra-axial fluid collections identified. The paranasal sinuses and mastoid air cells are clear. The calvarium is intact. IMPRESSION: 1. No acute intracranial abnormalities. 2. Left frontal lobe encephalomalacia compatible with chronic infarct. New from previous exam. 3. Small vessel ischemic change, similar to previous exam. Electronically Signed   By: Kerby Moors M.D.   On: 01/13/2015 22:04    Assessment: 64 y.o. female with multiple risk factors for stroke as well as history of previous stroke, presenting with probable recurrent left subcortical ischemic cerebral infarction, in addition to hyperglycemia.  Stroke Risk Factors - diabetes mellitus, family history, hyperlipidemia and hypertension  Plan: 1. HgbA1c, fasting lipid panel 2. MRI, MRA  of the brain without contrast 3. PT consult, OT consult, Speech consult 4. Echocardiogram 5. Carotid dopplers 6. Prophylactic therapy-Antiplatelet med: Aspirin 81 mg per day and Plavix 75 mg per day 7. Risk factor modification 8. Telemetry monitoring  C.R. [name] Kindred, MD [facility] [phone]  01/13/2015, 11:35  PM

## 2015-01-13 NOTE — ED Notes (Signed)
Per EMS: Pt has increasing R sided weakness. Hx of stroke in the past. Worsening of symptoms starting ~2230 on 12/17. Pt has some slurred speech. No complains of pain at this time.

## 2015-01-13 NOTE — ED Notes (Signed)
Patient transported to CT 

## 2015-01-13 NOTE — ED Notes (Signed)
Dr. Billy Fischer bedside

## 2015-01-13 NOTE — ED Notes (Signed)
Neurologist at bedside at this time.

## 2015-01-14 ENCOUNTER — Observation Stay (HOSPITAL_BASED_OUTPATIENT_CLINIC_OR_DEPARTMENT_OTHER): Payer: Medicare Other

## 2015-01-14 ENCOUNTER — Observation Stay (HOSPITAL_COMMUNITY): Payer: Medicare Other

## 2015-01-14 ENCOUNTER — Encounter (HOSPITAL_COMMUNITY): Payer: Self-pay | Admitting: Internal Medicine

## 2015-01-14 DIAGNOSIS — I639 Cerebral infarction, unspecified: Secondary | ICD-10-CM | POA: Diagnosis present

## 2015-01-14 DIAGNOSIS — G459 Transient cerebral ischemic attack, unspecified: Secondary | ICD-10-CM

## 2015-01-14 DIAGNOSIS — G8191 Hemiplegia, unspecified affecting right dominant side: Secondary | ICD-10-CM | POA: Insufficient documentation

## 2015-01-14 LAB — GLUCOSE, CAPILLARY
Glucose-Capillary: 94 mg/dL (ref 65–99)
Glucose-Capillary: 101 mg/dL — ABNORMAL HIGH (ref 65–99)
Glucose-Capillary: 318 mg/dL — ABNORMAL HIGH (ref 65–99)
Glucose-Capillary: 344 mg/dL — ABNORMAL HIGH (ref 65–99)

## 2015-01-14 MED ORDER — INSULIN ASPART 100 UNIT/ML ~~LOC~~ SOLN
0.0000 [IU] | Freq: Three times a day (TID) | SUBCUTANEOUS | Status: DC
  Administered 2015-01-14: 11 [IU] via SUBCUTANEOUS
  Administered 2015-01-15 (×3): 3 [IU] via SUBCUTANEOUS

## 2015-01-14 MED ORDER — PREGABALIN 25 MG PO CAPS
50.0000 mg | ORAL_CAPSULE | Freq: Two times a day (BID) | ORAL | Status: DC
  Administered 2015-01-14 – 2015-01-18 (×8): 50 mg via ORAL
  Filled 2015-01-14 (×9): qty 2

## 2015-01-14 MED ORDER — SODIUM CHLORIDE 0.9 % IJ SOLN: 3.0000 mL | INTRAMUSCULAR | Status: DC | PRN

## 2015-01-14 MED ORDER — SODIUM CHLORIDE 0.9 % IJ SOLN
3.0000 mL | Freq: Two times a day (BID) | INTRAMUSCULAR | Status: DC
  Administered 2015-01-14 – 2015-01-18 (×4): 3 mL via INTRAVENOUS

## 2015-01-14 MED ORDER — SODIUM CHLORIDE 0.9 % IV SOLN: 250.0000 mL | INTRAVENOUS | Status: DC | PRN

## 2015-01-14 MED ORDER — ASPIRIN EC 81 MG PO TBEC
81.0000 mg | DELAYED_RELEASE_TABLET | Freq: Every day | ORAL | Status: DC
Start: 2015-01-14 — End: 2015-01-18
  Administered 2015-01-14 – 2015-01-18 (×5): 81 mg via ORAL
  Filled 2015-01-14 (×5): qty 1

## 2015-01-14 MED ORDER — CLOPIDOGREL BISULFATE 75 MG PO TABS
75.0000 mg | ORAL_TABLET | Freq: Every day | ORAL | Status: DC
  Administered 2015-01-14 – 2015-01-18 (×5): 75 mg via ORAL
  Filled 2015-01-14 (×5): qty 1

## 2015-01-14 MED ORDER — METOPROLOL SUCCINATE ER 100 MG PO TB24
100.0000 mg | ORAL_TABLET | Freq: Every day | ORAL | Status: DC
  Administered 2015-01-14 – 2015-01-15 (×2): 100 mg via ORAL
  Filled 2015-01-14 (×2): qty 1

## 2015-01-14 MED ORDER — FERROUS SULFATE 325 (65 FE) MG PO TABS
325.0000 mg | ORAL_TABLET | Freq: Two times a day (BID) | ORAL | Status: DC
  Administered 2015-01-14 – 2015-01-18 (×8): 325 mg via ORAL
  Filled 2015-01-14 (×9): qty 1

## 2015-01-14 MED ORDER — ATORVASTATIN CALCIUM 40 MG PO TABS
40.0000 mg | ORAL_TABLET | Freq: Every day | ORAL | Status: DC
  Administered 2015-01-14 – 2015-01-17 (×4): 40 mg via ORAL
  Filled 2015-01-14 (×4): qty 1

## 2015-01-14 MED ORDER — INSULIN GLARGINE 100 UNIT/ML ~~LOC~~ SOLN
60.0000 [IU] | Freq: Two times a day (BID) | SUBCUTANEOUS | Status: DC
  Administered 2015-01-14 – 2015-01-15 (×5): 60 [IU] via SUBCUTANEOUS
  Filled 2015-01-14 (×7): qty 0.6

## 2015-01-14 MED ORDER — LOSARTAN POTASSIUM 50 MG PO TABS
100.0000 mg | ORAL_TABLET | Freq: Every day | ORAL | Status: DC
  Administered 2015-01-14 – 2015-01-15 (×2): 100 mg via ORAL
  Filled 2015-01-14 (×2): qty 2

## 2015-01-14 MED ORDER — GEMFIBROZIL 600 MG PO TABS
600.0000 mg | ORAL_TABLET | Freq: Two times a day (BID) | ORAL | Status: DC
  Administered 2015-01-14 – 2015-01-15 (×4): 600 mg via ORAL
  Filled 2015-01-14 (×7): qty 1

## 2015-01-14 MED ORDER — ENOXAPARIN SODIUM 40 MG/0.4ML ~~LOC~~ SOLN
40.0000 mg | Freq: Every day | SUBCUTANEOUS | Status: DC
  Administered 2015-01-14 – 2015-01-17 (×5): 40 mg via SUBCUTANEOUS
  Filled 2015-01-14 (×5): qty 0.4

## 2015-01-14 MED ORDER — PANTOPRAZOLE SODIUM 40 MG PO TBEC
40.0000 mg | DELAYED_RELEASE_TABLET | Freq: Every day | ORAL | Status: DC
  Administered 2015-01-14 – 2015-01-18 (×5): 40 mg via ORAL
  Filled 2015-01-14 (×5): qty 1

## 2015-01-14 MED ORDER — STROKE: EARLY STAGES OF RECOVERY BOOK
Freq: Once | Status: AC
  Administered 2015-01-14: 02:00:00

## 2015-01-14 MED ORDER — SODIUM CHLORIDE 0.9 % IJ SOLN
3.0000 mL | Freq: Two times a day (BID) | INTRAMUSCULAR | Status: DC
  Administered 2015-01-16 – 2015-01-18 (×5): 3 mL via INTRAVENOUS

## 2015-01-14 MED ORDER — ALPRAZOLAM 0.5 MG PO TABS: 4.0000 mg | ORAL_TABLET | Freq: Every evening | ORAL | Status: DC | PRN

## 2015-01-14 MED ORDER — PREDNISONE 20 MG PO TABS
30.0000 mg | ORAL_TABLET | Freq: Every day | ORAL | Status: AC
  Administered 2015-01-14: 30 mg via ORAL
  Filled 2015-01-14: qty 1

## 2015-01-14 MED ORDER — OXYCODONE HCL ER 40 MG PO T12A: 80.0000 mg | EXTENDED_RELEASE_TABLET | Freq: Two times a day (BID) | ORAL | Status: DC | PRN

## 2015-01-14 MED ORDER — MOMETASONE FURO-FORMOTEROL FUM 100-5 MCG/ACT IN AERO
2.0000 | INHALATION_SPRAY | Freq: Two times a day (BID) | RESPIRATORY_TRACT | Status: DC
  Administered 2015-01-14 – 2015-01-18 (×9): 2 via RESPIRATORY_TRACT
  Filled 2015-01-14: qty 8.8

## 2015-01-14 MED ORDER — AMLODIPINE BESYLATE 10 MG PO TABS
10.0000 mg | ORAL_TABLET | Freq: Every day | ORAL | Status: DC
Start: 2015-01-14 — End: 2015-01-14
  Administered 2015-01-14: 10 mg via ORAL
  Filled 2015-01-14: qty 1

## 2015-01-14 NOTE — Progress Notes (Signed)
Patient refusing lab draw this morning as well as any other tests for the day. She says she is frustrated and tired of everything. I explained the importance of these exams and tests but she did not want to hear it. Will pass along to day shift. Jurnei Latini, Rande Brunt, RN

## 2015-01-14 NOTE — H&P (Signed)
[facility] History and Physical  Michelle Holmes [id number] DOB: 05/28/1950 DOA: 01/13/2015  Referring physician: Gareth Morgan, MD PCP: Charolette Forward, MD   Chief Complaint: Right-sided weakness.  HPI: Michelle Holmes is a 65 y.o. female with a past medical history of emphysema, colon cancer, hypertension, hyperlipidemia, CHF, CAD, chronic renal insufficiency, previous CVA who presents to the emergency department with right-sided weakness since 22:30 Saturday evening and elevated blood glucose. She was seen yesterday in the emergency department due to a blood glucose of 504 mg/dL and returns due to her neurological symptoms. Per patient, she has had similar stroke reactivation symptoms in the past when her blood glucose increases. She denies headache, blurred vision, photophobia or phonophobia. Denies chest pain, palpitations, diaphoresis, dyspnea. She has occasional pitting edema lower extremities.  When seen, patient was in no acute distress. She denied any new change in her symptoms.  Review of Systems:  Constitutional:  Occasional fatigue. No weight loss, night sweats, Fevers, chills,   HEENT:  No headaches, Difficulty swallowing,Tooth/dental problems,Sore throat,  No sneezing, itching, ear ache, nasal congestion, post nasal drip,  Cardio-vascular:  No chest pain, Orthopnea, PND, swelling in lower extremities, anasarca, dizziness, palpitations  GI:  No heartburn, indigestion, abdominal pain, nausea, vomiting, diarrhea, change in bowel habits, loss of appetite  Resp:  No shortness of breath with exertion or at rest. No excess mucus, no productive cough, No non-productive cough, No coughing up of blood.No change in color of mucus.No wheezing.No chest wall deformity  Skin:  no rash or lesions.  GU:  no dysuria, change in color of urine, no urgency or frequency. No flank pain.  Musculoskeletal:  No joint pain or swelling. No decreased range of motion. No back pain.  Psych:    No change in mood or affect. No depression or anxiety. No memory loss.  Neuro: As above mentioned. Past Medical History  Diagnosis Date  . Emphysema   . Colon cancer ([place])   . Hypertension   . Hypercholesteremia   . Diabetes mellitus   . COPD (chronic obstructive pulmonary disease) ([place])   . Coronary artery disease   . Obesity   . Sleep apnea   . TIA (transient ischemic attack)   . CHF (congestive heart failure) ([place])   . Anemia   . DJD (degenerative joint disease)   . Renal insufficiency   . Chronic back pain   . Scoliosis   . Obesity hypoventilation syndrome ([place])   . Stroke ([place])   . Myocardial infarction [facility])    Past Surgical History  Procedure Laterality Date  . Coronary angioplasty with stent placement    . Colon surgery     Social History:  reports that she has quit smoking. She does not have any smokeless tobacco history on file. She reports that she does not drink alcohol or use illicit drugs.  Allergies  Allergen Reactions  . Penicillins Hives and Swelling    Has patient had a PCN reaction causing immediate rash, facial/tongue/throat swelling, SOB or lightheadedness with hypotension: yes Has patient had a PCN reaction causing severe rash involving mucus membranes or skin necrosis: no Has patient had a PCN reaction that required hospitalization no Has patient had a PCN reaction occurring within the last 10 years: no If all of the above answers are "NO", then may proceed with Cephalosporin use.     History reviewed. No pertinent family history.   Prior to Admission medications   Medication Sig Start Date End Date Taking?  Authorizing Provider  ADVAIR DISKUS 250-50 MCG/DOSE AEPB Inhale 1 puff into the lungs 2 (two) times daily.  08/22/14  Yes Historical Provider, MD  alprazolam Duanne Moron) 2 MG tablet Take 4 mg by mouth at bedtime.   Yes Historical Provider, MD  amLODipine (NORVASC) 10 MG tablet Take 10 mg by mouth daily.  08/22/14  Yes Historical Provider, MD   aspirin EC 81 MG EC tablet Take 1 tablet (81 mg total) by mouth daily. 06/22/14  Yes Charolette Forward, MD  atorvastatin (LIPITOR) 40 MG tablet Take 40 mg by mouth daily. 07/25/14  Yes Historical Provider, MD  clopidogrel (PLAVIX) 75 MG tablet Take 75 mg by mouth daily. 07/25/14  Yes Historical Provider, MD  ferrous sulfate 325 (65 FE) MG tablet Take 1 tablet (325 mg total) by mouth 2 (two) times daily with a meal. 06/22/14 08/18/17 Yes Charolette Forward, MD  gemfibrozil (LOPID) 600 MG tablet Take 600 mg by mouth 2 (two) times daily before a meal.   Yes Historical Provider, MD  insulin glargine (LANTUS) 100 UNIT/ML injection Inject 0.6 mLs (60 Units total) into the skin 2 (two) times daily. 06/22/14  Yes Charolette Forward, MD  losartan (COZAAR) 100 MG tablet Take 100 mg by mouth daily.  12/01/10  Yes Historical Provider, MD  LYRICA 50 MG capsule Take 50 mg by mouth 2 (two) times daily. 08/01/14  Yes Historical Provider, MD  metoprolol succinate (TOPROL-XL) 100 MG 24 hr tablet Take 100 mg by mouth daily. Take with or immediately following a meal.   Yes Historical Provider, MD  ondansetron (ZOFRAN) 4 MG tablet Take 1 tablet (4 mg total) by mouth every 6 (six) hours. Patient taking differently: Take 4 mg by mouth every 8 (eight) hours as needed for nausea.  [id number]  Yes Delora Fuel, MD  OxyCODONE (OXYCONTIN) 80 mg T12A 12 hr tablet Take 80 mg by mouth 2 (two) times daily as needed (pain).  08/15/14  Yes Historical Provider, MD  pantoprazole (PROTONIX) 40 MG tablet Take 40 mg by mouth daily. 06/04/14  Yes Historical Provider, MD  predniSONE (DELTASONE) 5 MG tablet Tapering dose for 12 days 12-14 to 12-26  6 for 2 days, 5 for 2 days, 4 for 2 days, 3 for 2 days, 2 for 2 days, 1 for 2 days 01/09/15  Yes Historical Provider, MD  rosuvastatin (CRESTOR) 10 MG tablet Take 10 mg by mouth daily.  12/01/10  Yes Historical Provider, MD  traMADol (ULTRAM) 50 MG tablet Take 50 mg by mouth every 4 (four) hours as needed for moderate pain.   01/09/15  Yes Historical Provider, MD   Physical Exam: Filed Vitals:   01/14/15 0015 01/14/15 0027 01/14/15 0046 01/14/15 0306  BP: 148/81  175/92 169/86  Pulse: 78  76 89  Temp:  98.2 F (36.8 C)  98.1 F (36.7 C)  TempSrc:   Oral Oral  Resp: 15  16   Height:   5\' 5"  (1.651 m)   Weight:   103.193 kg (227 lb 8 oz)   SpO2: 100%  100% 100%    Wt Readings from Last 3 Encounters:  01/14/15 103.193 kg (227 lb 8 oz)  01/12/15 102.513 kg (226 lb)  06/20/14 104.781 kg (231 lb)    General:  Appears calm and comfortable Eyes: PERRL, normal lids, irises & conjunctiva ENT: grossly normal hearing, lips & tongue Neck: no LAD, masses or thyromegaly Cardiovascular: RRR, no m/r/g. No LE edema. Telemetry: SR, no arrhythmias  Respiratory: CTA bilaterally, no w/r/r.  Normal respiratory effort. Abdomen: Obese, soft, ntnd Skin: no rash or induration seen on limited exam Musculoskeletal: grossly normal tone BUE/BLE Psychiatric: grossly normal mood and affect, speech fluent and appropriate Neurologic: Awake alert oriented 3, mildly slurred speech, 3-1/2 over 5 right-sided hemiparesis, unable to test gait due to weakness..           Labs on Admission:  Basic Metabolic Panel:  Recent Labs Lab 01/12/15 1336 01/13/15 2133  NA 131* 135  K 5.4* 4.3  CL 101 109  CO2 19* 17*  GLUCOSE 504* 258*  BUN 65* 61*  CREATININE 2.70* 2.69*  CALCIUM 9.6 9.3   Liver Function Tests:  Recent Labs Lab 01/13/15 2133  AST 13*  ALT 15  ALKPHOS 80  BILITOT 0.4  PROT 6.8  ALBUMIN 3.5   CBC:  Recent Labs Lab 01/12/15 1336 01/13/15 2133  WBC 6.5 7.4  NEUTROABS  --  4.5  HGB 10.6* 10.2*  HCT 30.7* 30.3*  MCV 91.9 93.2  PLT 165 167   Troponin   Recent Labs  01/13/15 2205  TROPIPOC 0.01    CBG:  Recent Labs Lab 01/12/15 1322 01/12/15 1623 01/12/15 1727 01/13/15 2121  GLUCAP 465* 416* 318* 247*    Radiological Exams on Admission: Ct Head Wo Contrast  01/13/2015  CLINICAL  DATA:  Right-sided weakness. EXAM: CT HEAD WITHOUT CONTRAST TECHNIQUE: Contiguous axial images were obtained from the base of the skull through the vertex without intravenous contrast. COMPARISON:  02/07/2012 FINDINGS: There is encephalomalacia within the left frontal lobe compatible with chronic infarct. This is new since 02/07/2012. Patchy areas of low-attenuation are identified 2 areas of subcortical low attenuation within the posterior right parietal lobe are identified and appear unchanged from previous exam. Prominence of the sulci and ventricles noted compatible with mild brain atrophy. No evidence for acute intracranial hemorrhage, mass or acute brain infarct. No abnormal extra-axial fluid collections identified. The paranasal sinuses and mastoid air cells are clear. The calvarium is intact. IMPRESSION: 1. No acute intracranial abnormalities. 2. Left frontal lobe encephalomalacia compatible with chronic infarct. New from previous exam. 3. Small vessel ischemic change, similar to previous exam. Electronically Signed   By: Kerby Moors M.D.   On: 01/13/2015 22:04   Mr Brain Ltd W/o Cm  01/14/2015  CLINICAL DATA:  Initial evaluation for acute exacerbation of right-sided weakness. EXAM: MRI HEAD WITHOUT CONTRAST TECHNIQUE: Multiplanar, multiecho pulse sequences of the brain and surrounding structures were obtained without intravenous contrast. COMPARISON:  Prior CT from 01/13/2015. FINDINGS: Study is limited as only axial diffusion and sagittal T1 weighted sequences were performed. Diffusion-weighted imaging demonstrates a 11 x 9 mm focus of restricted diffusion within the left thalamus extending towards the left cerebral peduncle (series 4, image 23). Additional small 11 mm curvilinear focus of restricted diffusion within knee adjacent posterior left internal capsule/ mesial left temporal lobe. No associated mass effect. Additional tiny punctate focus of restricted diffusion at the level of the right  lentiform nucleus (series 4, image 26), also suspicious for a tiny acute infarct. No other infarct identified on this limited exam. Encephalomalacia within the left frontal lobe noted. No mass effect or midline shift. No hydrocephalus. No definite extra-axial fluid collection. Craniocervical junction within normal limits. Pituitary gland normal. Bone marrow signal intensity within normal limits. Scalp soft tissues are unremarkable. IMPRESSION: 1. Limited study with only diffusion and sagittal T1 weighted sequences performed. 2. 11 x 9 mm acute ischemic infarct centered at the inferior left thalamus/left cerebral peduncle,  with adjacent 11 mm curvilinear infarct within the posterior limb of the left internal capsule/mesial left temporal lobe. No associated mass effect. 3. Additional punctate foci of diffusion abnormality within the right basal ganglia as above, also suspicious for tiny acute ischemic infarcts. 4. No other definite acute intracranial process on this limited study. Electronically Signed   By: Jeannine Boga M.D.   On: 01/14/2015 03:24   Echocardiogram 02/09/2012 ------------------------------------------------------------ LV EF: 55% -  60%  ------------------------------------------------------------ Indications:  Previous study 10/14/2009.  CVA 436.  ------------------------------------------------------------ History:  PMH: Sleep apnea. Morbid obesity. Chronic renal failure. History of TIA. Altered mental status. Coronary artery disease. Congestive heart failure. Risk factors: Diabetes mellitus.  ------------------------------------------------------------ Study Conclusions  - Left ventricle: The cavity size was normal. There was mild concentric hypertrophy. Systolic function was normal. The estimated ejection fraction was in the range of 55% to 60%. Wall motion was normal; there were no regional wall motion abnormalities. Doppler parameters are  consistent with abnormal left ventricular relaxation (grade 1 diastolic dysfunction). - Atrial septum: No defect or patent foramen ovale was identified. Impressions:  - No cardiac source of emboli was indentified.  EKG: Independently reviewed.  Vent. rate 76 BPM PR interval 168 ms QRS duration 80 ms QT/QTc 377/424 ms P-R-T axes 53 16 55 Sinus rhythm Artifact No significant change since last EKG  Assessment/Plan Principal Problem:   CVA (cerebral vascular accident) ([place])   Right sided weakness Admit to telemetry. Frequent neuro checks. Check fasting lipids and hemoglobin A1c. Check echocardiogram and carotid Doppler. Continue aspirin and clopidogrel. Neurology is on the case.  Active Problems:   Uncontrolled type 2 DM with hyperosmolar nonketotic hyperglycemia (HCC) Continue Lantus 60 units twice a day. CBG monitoring with regular insulin sliding scale while the patient is in the hospital. Check hemoglobin A1c.    CHF (congestive heart failure) (HCC) Continue losartan 100 mg by mouth daily and metoprolol succinate 100 mg by mouth daily      CAD (coronary artery disease) Continue antiplatelet therapy. Continue beta blocker, atorvastatin and gemfibrozil for hyper lipid in. Monitor LFTs periodically.     Chronic renal failure Monitor BUN and creatinine.    Hypertension Continue metoprolol 100 mg by mouth daily. Continue losartan 100 mg by mouth daily. Monitor blood pressure.   Neurology is on the case.   Code Status: Full code. DVT Prophylaxis: Lovenox SQ.  Family Communication:  Disposition Plan: Admit to telemetry, stroke/TIA workup.  Time spent: Over 70 minutes were used in the process of this admission.  Reubin Milan [facility] Pager [phone].

## 2015-01-14 NOTE — Progress Notes (Signed)
  Echocardiogram 2D Echocardiogram has been performed.  Jasim Harari 01/14/2015, 11:15 AM

## 2015-01-14 NOTE — ED Notes (Addendum)
Attempted to call report to floor. Informed by Roselyn Reef, RN that patient has not been approved for the floor & she will need to review to see if patient is appropriate. RN is to return call for report.

## 2015-01-14 NOTE — Progress Notes (Signed)
MRI called to report that patient refused to have MRI completed once she was downstairs. Patient claims, "I don't want to do it," even after education of importance. MD paged. Corrin Sieling, Rande Brunt, RN

## 2015-01-14 NOTE — Progress Notes (Signed)
PROGRESS NOTE    Michelle Holmes [id number] DOB: 1950-11-30 DOA: 01/13/2015 PCP: Charolette Forward, MD  HPI/Brief narrative 64 year old female with history of COPD, OSA/OHS, chronic respiratory failure on home oxygen 2 L/m, HTN, HLD, chronic diastolic CHF, CAD, stage IV CKD, DM 2, colon cancer and CVA, presented to the ED with right-sided weakness that began at 10:30 PM on 01/12/15. She also had elevated blood sugar of 504 in the emergency department. Admitted for stroke evaluation. Neurology consulted.   Assessment/Plan:  Acute left brain stroke - Etiology: Possibly small vessel disease - MRI brain: Limited study. 11 x 9 cm acute ischemic infarct centered at the inferior left thalamus/left cerebral peduncle, with adjacent 11 mm curvilinear infarct within the posterior limb of the left internal capsule/Mecial left temporal lobe & additional punctate foci of diffusion abnormality within the right basal ganglia also suspicious for tiny acute ischemic infarcts - MRA brain: Patient has been consistently refusing - Carotid Dopplers: No plaque or stenosis seen in ICAs bilaterally. Vertebral artery flow is antegrade - 2-D echo: LVEF 55-60 percent and grade 1 diastolic dysfunction. - [id number]: Pending. Patient refusing labs. - Lipid panel: Pending. Patient refusing labs. - Patient was on aspirin 81 MG + Plavix 75 MG PTA-continue same. - Neurology consultation appreciated. Stroke team follow-up pending. - Therapies: Evaluation pending  Uncontrolled DM 2/IDDM - Claims compliance with Lantus but not to her diet. - Resumed home dose of Lantus 60 units twice a day and added SSI. - Improved control. Monitor closely. - Hemoglobin A1c 06/20/14:11.9. Goal <7.  Essential hypertension - Mildly uncontrolled. Allow for permissive hypertension. Currently on amlodipine, metoprolol and losartan. Hold amlodipine  COPD, OSA/OHS, chronic respiratory failure with hypoxia - Stable. Continue oxygen. Former  smoker.  Morbid obesity/Body mass index is 37.86 kg/(m^2).  Chronic diastolic CHF - Compensated  Hyperlipidemia - LDL goal <70. No recent LDL in system. Patient refusing labs. Continue atorvastatin and gemfibrozil.  Anemia - Stable  Stage IV chronic kidney disease/metabolic acidosis - Baseline creatinine: 2.7. Creatinine at baseline. Recommend outpatient nephrology consultation and follow-up.   DVT prophylaxis: Lovenox Code Status: Full Family Communication: Discussed with patient's mother-in-law and son-in-law at bedside - per patient's request Disposition Plan: Dc when medically stable. Disposition to be determined.   Consultants:  Neurology  Procedures:  2-D echo 01/14/15: Study Conclusions  - Left ventricle: The cavity size was normal. Wall thickness was increased in a pattern of moderate LVH. Systolic function was normal. The estimated ejection fraction was in the range of 55% to 60%. Wall motion was normal; there were no regional wall motion abnormalities. Doppler parameters are consistent with abnormal left ventricular relaxation (grade 1 diastolic dysfunction). - Atrial septum: No defect or patent foramen ovale was identified.  Carotid Dopplers: Carotid duplex completed. No plaque or stenosis seen in ICA's bilaterally. Vertebral artery flow is antegrade.   Antibiotics:  None   Subjective: Right-sided weakness is slightly better. Denies any other complaints.  Objective: Filed Vitals:   01/14/15 0502 01/14/15 0700 01/14/15 0911 01/14/15 1015  BP: 171/97 139/86  124/72  Pulse: 83 72  65  Temp: 97.6 F (36.4 C) 98.1 F (36.7 C)  98.1 F (36.7 C)  TempSrc: Oral Oral  Oral  Resp: 16 18  18   Height:      Weight:      SpO2: 97% 100% 100% 100%   No intake or output data in the 24 hours ending 01/14/15 1345 Filed Weights   01/14/15 0046  Weight: 103.193  kg (227 lb 8 oz)     Exam:  General exam: Moderately built and obese pleasant  middle-aged female lying comfortably propped up in bed. Respiratory system: Clear. No increased work of breathing. Cardiovascular system: S1 & S2 heard, RRR. No JVD, murmurs, gallops, clicks or pedal edema. Telemetry: Sinus rhythm. Gastrointestinal system: Abdomen is nondistended, soft and nontender. Normal bowel sounds heard. Central nervous system: Alert and oriented. No focal neurological deficits. Extremities: Symmetric 5 x 5 power in left limbs. Grade 4 x 5 power in right upper extremity and at least grade 3 x 5 power in right lower extremity.   Data Reviewed: Basic Metabolic Panel:  Recent Labs Lab 01/12/15 1336 01/13/15 2133  NA 131* 135  K 5.4* 4.3  CL 101 109  CO2 19* 17*  GLUCOSE 504* 258*  BUN 65* 61*  CREATININE 2.70* 2.69*  CALCIUM 9.6 9.3   Liver Function Tests:  Recent Labs Lab 01/13/15 2133  AST 13*  ALT 15  ALKPHOS 80  BILITOT 0.4  PROT 6.8  ALBUMIN 3.5   No results for input(s): LIPASE, AMYLASE in the last 168 hours. No results for input(s): AMMONIA in the last 168 hours. CBC:  Recent Labs Lab 01/12/15 1336 01/13/15 2133  WBC 6.5 7.4  NEUTROABS  --  4.5  HGB 10.6* 10.2*  HCT 30.7* 30.3*  MCV 91.9 93.2  PLT 165 167   Cardiac Enzymes: No results for input(s): CKTOTAL, CKMB, CKMBINDEX, TROPONINI in the last 168 hours. BNP (last 3 results) No results for input(s): PROBNP in the last 8760 hours. CBG:  Recent Labs Lab 01/12/15 1623 01/12/15 1727 01/13/15 2121 01/14/15 0637 01/14/15 1208  GLUCAP 416* 318* 247* 94 101*    No results found for this or any previous visit (from the past 240 hour(s)).         Studies: Dg Chest 2 View  01/14/2015  CLINICAL DATA:  TIA symptoms, no acute chest complaints, history of COPD and CHF and coronary angioplasty. EXAM: CHEST  2 VIEW COMPARISON:  Chest x-ray of Jun 20, 2014 FINDINGS: The lungs are borderline hypoinflated which not a new finding. The cardiac silhouette remains enlarged. The  pulmonary interstitial markings are minimally prominent but not significantly changed from the previous study. There is marked reverse S shaped thoracolumbar scoliosis. On the lateral film increased density projects over the mid to lower thoracic spine but this is stable IMPRESSION: Mild interstitial prominence of both lungs is partially due to borderline hypoinflation,but low-grade interstitial edema or other interstitial process may be present. Electronically Signed   By: David  Martinique M.D.   On: 01/14/2015 07:32   Ct Head Wo Contrast  01/13/2015  CLINICAL DATA:  Right-sided weakness. EXAM: CT HEAD WITHOUT CONTRAST TECHNIQUE: Contiguous axial images were obtained from the base of the skull through the vertex without intravenous contrast. COMPARISON:  02/07/2012 FINDINGS: There is encephalomalacia within the left frontal lobe compatible with chronic infarct. This is new since 02/07/2012. Patchy areas of low-attenuation are identified 2 areas of subcortical low attenuation within the posterior right parietal lobe are identified and appear unchanged from previous exam. Prominence of the sulci and ventricles noted compatible with mild brain atrophy. No evidence for acute intracranial hemorrhage, mass or acute brain infarct. No abnormal extra-axial fluid collections identified. The paranasal sinuses and mastoid air cells are clear. The calvarium is intact. IMPRESSION: 1. No acute intracranial abnormalities. 2. Left frontal lobe encephalomalacia compatible with chronic infarct. New from previous exam. 3. Small vessel ischemic change,  similar to previous exam. Electronically Signed   By: Kerby Moors M.D.   On: 01/13/2015 22:04   Mr Brain Ltd W/o Cm  01/14/2015  CLINICAL DATA:  Initial evaluation for acute exacerbation of right-sided weakness. EXAM: MRI HEAD WITHOUT CONTRAST TECHNIQUE: Multiplanar, multiecho pulse sequences of the brain and surrounding structures were obtained without intravenous contrast.  COMPARISON:  Prior CT from 01/13/2015. FINDINGS: Study is limited as only axial diffusion and sagittal T1 weighted sequences were performed. Diffusion-weighted imaging demonstrates a 11 x 9 mm focus of restricted diffusion within the left thalamus extending towards the left cerebral peduncle (series 4, image 23). Additional small 11 mm curvilinear focus of restricted diffusion within knee adjacent posterior left internal capsule/ mesial left temporal lobe. No associated mass effect. Additional tiny punctate focus of restricted diffusion at the level of the right lentiform nucleus (series 4, image 26), also suspicious for a tiny acute infarct. No other infarct identified on this limited exam. Encephalomalacia within the left frontal lobe noted. No mass effect or midline shift. No hydrocephalus. No definite extra-axial fluid collection. Craniocervical junction within normal limits. Pituitary gland normal. Bone marrow signal intensity within normal limits. Scalp soft tissues are unremarkable. IMPRESSION: 1. Limited study with only diffusion and sagittal T1 weighted sequences performed. 2. 11 x 9 mm acute ischemic infarct centered at the inferior left thalamus/left cerebral peduncle, with adjacent 11 mm curvilinear infarct within the posterior limb of the left internal capsule/mesial left temporal lobe. No associated mass effect. 3. Additional punctate foci of diffusion abnormality within the right basal ganglia as above, also suspicious for tiny acute ischemic infarcts. 4. No other definite acute intracranial process on this limited study. Electronically Signed   By: Jeannine Boga M.D.   On: 01/14/2015 03:24        Scheduled Meds: . amLODipine  10 mg Oral Daily  . aspirin EC  81 mg Oral Daily  . atorvastatin  40 mg Oral q1800  . clopidogrel  75 mg Oral Daily  . enoxaparin (LOVENOX) injection  40 mg Subcutaneous QHS  . ferrous sulfate  325 mg Oral BID WC  . gemfibrozil  600 mg Oral BID AC  .  insulin aspart  0-15 Units Subcutaneous TID WC  . insulin glargine  60 Units Subcutaneous BID  . losartan  100 mg Oral Daily  . metoprolol succinate  100 mg Oral Daily  . mometasone-formoterol  2 puff Inhalation BID  . pantoprazole  40 mg Oral Daily  . pregabalin  50 mg Oral BID  . sodium chloride  3 mL Intravenous Q12H  . sodium chloride  3 mL Intravenous Q12H   Continuous Infusions:   Principal Problem:   CVA (cerebral vascular accident) ([place]) Active Problems:   Uncontrolled type 2 DM with hyperosmolar nonketotic hyperglycemia (HCC)   CHF (congestive heart failure) (HCC)   Chronic renal failure   CAD (coronary artery disease)   Right sided weakness    Time spent: 40 minutes.    Vernell Leep, MD, FACP, FHM. [facility] Pager [phone]  If 7PM-7AM, please contact night-coverage [email or web address] Password [email or web address] 01/14/2015, 1:45 PM    LOS: 1 day

## 2015-01-14 NOTE — Progress Notes (Signed)
STROKE TEAM PROGRESS NOTE   HISTORY Michelle Holmes is an 64 y.o. female with history of diabetes mellitus, hypertension, hypercholesterolemia, COPD, previous stroke, and coronary artery disease, presenting with exacerbation of weakness involving her right side starting at 10:30 PM last night. Patient has also had abated blood sugar, including a level of 504 in the emergency room today. His been taking aspirin and Plavix daily. CT scan of her head acute changes. Frontal encephalomalacia from previous stroke was noted. NIH stroke score was 7.  LSN: 10:30 PM on 01/12/2015 tPA Given: No: Beyond time under for treatment consideration mRankin:   SUBJECTIVE (INTERVAL HISTORY) Multiple church members present. The patient does not want to attempt repeat MRI. Dr. Leonie Man explained that it would not be necessary. Will order TCDs.   OBJECTIVE Temp:  [97.6 F (36.4 C)-98.2 F (36.8 C)] 98.1 F (36.7 C) (12/19 0700) Pulse Rate:  [67-89] 72 (12/19 0700) Cardiac Rhythm:  [-]  Resp:  [13-18] 18 (12/19 0700) BP: (129-175)/(81-104) 139/86 mmHg (12/19 0700) SpO2:  [97 %-100 %] 100 % (12/19 0700) Weight:  [103.193 kg (227 lb 8 oz)] 103.193 kg (227 lb 8 oz) (12/19 0046)  CBC:  Recent Labs Lab 01/12/15 1336 01/13/15 2133  WBC 6.5 7.4  NEUTROABS  --  4.5  HGB 10.6* 10.2*  HCT 30.7* 30.3*  MCV 91.9 93.2  PLT 165 [id number]    Basic Metabolic Panel:  Recent Labs Lab 01/12/15 1336 01/13/15 2133  NA 131* 135  K 5.4* 4.3  CL 101 109  CO2 19* 17*  GLUCOSE 504* 258*  BUN 65* 61*  CREATININE 2.70* 2.69*  CALCIUM 9.6 9.3    Lipid Panel:    Component Value Date/Time   CHOL 167 02/08/2012 0800   TRIG 712* 02/08/2012 0800   HDL 19* 02/08/2012 0800   CHOLHDL 8.8 02/08/2012 0800   VLDL UNABLE TO CALCULATE IF TRIGLYCERIDE OVER 400 mg/dL 02/08/2012 0800   LDLCALC UNABLE TO CALCULATE IF TRIGLYCERIDE OVER 400 mg/dL 02/08/2012 0800   HgbA1c:  Lab Results  Component Value Date   HGBA1C 11.9* 06/20/2014    Urine Drug Screen:    Component Value Date/Time   LABOPIA NONE DETECTED 01/13/2015 2238   COCAINSCRNUR NONE DETECTED 01/13/2015 2238   LABBENZ NONE DETECTED 01/13/2015 2238   AMPHETMU NONE DETECTED 01/13/2015 2238   THCU NONE DETECTED 01/13/2015 2238   LABBARB NONE DETECTED 01/13/2015 2238      IMAGING  Dg Chest 2 View 01/14/2015   Mild interstitial prominence of both lungs is partially due to borderline hypoinflation,but low-grade interstitial edema or other interstitial process may be present.  Ct Head Wo Contrast 01/13/2015   1. No acute intracranial abnormalities.  2. Left frontal lobe encephalomalacia compatible with chronic infarct. New from previous exam.  3. Small vessel ischemic change, similar to previous exam.    Mr Brain Ltd W/o Cm  01/14/2015   1. Limited study with only diffusion and sagittal T1 weighted sequences performed.  2. 11 x 9 mm acute ischemic infarct centered at the inferior left thalamus/left cerebral peduncle, with adjacent 11 mm curvilinear infarct within the posterior limb of the left internal capsule/mesial left temporal lobe. No associated mass effect.  3. Additional punctate foci of diffusion abnormality within the right basal ganglia as above, also suspicious for tiny acute ischemic infarcts.  4. No other definite acute intracranial process on this limited study.        PHYSICAL EXAM Pleasant middle-aged lady not in distress. . Afebrile. Head  is nontraumatic. Neck is supple without bruit.    Cardiac exam no murmur or gallop. Lungs are clear to auscultation. Distal pulses are well felt. Neurological Exam :  Awake alert oriented 3. Mild dysarthria but no aphasia. Follows commands well. Extraocular moments are full range without nystagmus. Fundi were not visualized. Vision acuity seems adequate. Mild right lower facial weakness. Tongue midline. Motor system exam revealed moderate right hemiplegia with weakness of right grip and intrinsic hand  muscles. Slight right upper extremity drift. Mild weakness of right hip flexors and ankle dorsiflexors. Tone is increased on the right side. Deep tendon reflexes are brisker on the right compared to the left. Right plantar is upgoing left is downgoing. Sensation is diminished in the right hemibody. Gait was not tested. Coordination is slightly impaired on the right compared to the left. ASSESSMENT/PLAN [name] is a 64 y.o. female with history of diabetes mellitus, hypertension, hypercholesterolemia, COPD, previous stroke, and coronary artery disease presenting with exacerbation of weakness involving her right side . She did not receive IV t-PA due to late presentation.  Stroke:  Dominant infarcts possibly embolic from an unknown source.  Resultant  right hemiplegia  MRI  as noted above  MRA  not performed  TCD's - pending  Carotid Doppler - No significant plaque or stenosis at either carotid bifurcations. Bilateral vertebral artery flow is  antegrade.  2D Echo  EF 55-60%. No cardiac source of emboli identified.  LDL unable to calculate secondary to triglycerides of 712  HgbA1c pending  VTE prophylaxis - not  Diet Heart Room service appropriate?: Yes; Fluid consistency:: Thin  aspirin 81 mg daily and clopidogrel 75 mg daily prior to admission, now on aspirin 81 mg daily and clopidogrel 75 mg daily  Patient counseled to be compliant with her antithrombotic medications  Ongoing aggressive stroke risk factor management  Therapy recommendations: Pending  Disposition: Pending  Hypertension  Stable Permissive hypertension (OK if < 220/120) but gradually normalize in 5-7 days  Hyperlipidemia  Home meds:  Lipitor 40 mg daily resumed in hospital. Also on Lopid 600 mg twice a day prior to admission.  LDL could not be calculated, goal < 70  Continue Lipitor 40 mg daily since patient is also on Lopid - confirm compliance with medications.  Continue statin at  discharge  Diabetes  HgbA1c pending, goal < 7.0  Uncontrolled  Other Stroke Risk Factors  Advanced age  Cigarette smoker, quit smoking   Obesity, Body mass index is 37.86 kg/(m^2).   Hx stroke/TIA  Coronary artery disease   Other Active Problems  Anemia  Renal insufficiency   Hospital day # 1  Mikey Bussing PA-C [facility] Pager [phone] 01/14/2015, 6:40 PM I have personally examined this patient, reviewed notes, independently viewed imaging studies, participated in medical decision making and plan of care. I have made any additions or clarifications directly to the above note. Agree with note above. She presented with worsening of previous right-sided weakness secondary to new left brain subcortical infarct etiology likely small vessel disease. She remains at risk for neurological worsening, recurrent stroke, TIA needs ongoing stroke evaluation and aggressive risk factor modification.  Antony Contras, MD Medical Director [facility] Pager: [phone] 01/14/2015 8:47 PM     To contact Stroke Continuity provider, please refer to [email or web address] After hours, contact General Neurology

## 2015-01-14 NOTE — Progress Notes (Signed)
VASCULAR LAB PRELIMINARY  PRELIMINARY  PRELIMINARY  PRELIMINARY  Carotid duplex completed. No plaque or stenosis seen in ICA's bilaterally.  Vertebral artery flow is antegrade.      Preliminary report:    Janifer Adie, RVT, RDMS 01/14/2015, 11:55 AM

## 2015-01-14 NOTE — Progress Notes (Addendum)
Patient arrived to 5M06 AAOx4. Tele placed and vitals taken. Waiting for additional orders. Family at the bedside. Will continue to monitor.Carling Liberman, Rande Brunt, RN   MD paged of her arrival to the unit.

## 2015-01-15 ENCOUNTER — Inpatient Hospital Stay (HOSPITAL_COMMUNITY): Payer: Medicare Other

## 2015-01-15 DIAGNOSIS — I639 Cerebral infarction, unspecified: Secondary | ICD-10-CM | POA: Insufficient documentation

## 2015-01-15 DIAGNOSIS — I63039 Cerebral infarction due to thrombosis of unspecified carotid artery: Secondary | ICD-10-CM

## 2015-01-15 LAB — LIPID PANEL
Cholesterol: 224 mg/dL — ABNORMAL HIGH (ref 0–200)
HDL: 22 mg/dL — ABNORMAL LOW (ref >40)
LDL Cholesterol: UNDETERMINED mg/dL (ref 0–99)
Total CHOL/HDL Ratio: 10.2 RATIO
Triglycerides: 579 mg/dL — ABNORMAL HIGH (ref <150)
VLDL: UNDETERMINED mg/dL (ref 0–40)

## 2015-01-15 LAB — GLUCOSE, CAPILLARY
Glucose-Capillary: 238 mg/dL — ABNORMAL HIGH (ref 65–99)
Glucose-Capillary: 189 mg/dL — ABNORMAL HIGH (ref 65–99)
Glucose-Capillary: 178 mg/dL — ABNORMAL HIGH (ref 65–99)
Glucose-Capillary: 197 mg/dL — ABNORMAL HIGH (ref 65–99)
Glucose-Capillary: 187 mg/dL — ABNORMAL HIGH (ref 65–99)

## 2015-01-15 MED ORDER — FENOFIBRATE 54 MG PO TABS
54.0000 mg | ORAL_TABLET | Freq: Every day | ORAL | Status: DC
  Administered 2015-01-16 – 2015-01-18 (×3): 54 mg via ORAL
  Filled 2015-01-15 (×4): qty 1

## 2015-01-15 MED ORDER — INSULIN ASPART 100 UNIT/ML ~~LOC~~ SOLN
0.0000 [IU] | Freq: Three times a day (TID) | SUBCUTANEOUS | Status: DC
  Administered 2015-01-16: 3 [IU] via SUBCUTANEOUS

## 2015-01-15 MED ORDER — INSULIN ASPART 100 UNIT/ML ~~LOC~~ SOLN
0.0000 [IU] | Freq: Every day | SUBCUTANEOUS | Status: DC
  Administered 2015-01-15: 2 [IU] via SUBCUTANEOUS

## 2015-01-15 NOTE — Progress Notes (Signed)
Inpatient Rehabilitation  Patient was screened by Rogene Ciena Sampley for appropriateness for an Inpatient Acute Rehab consult.  At this time, we are recommending Inpatient Rehab consult.  Please order when you feel appropriate.   Sharifa Diona Peregoy PT Inpatient Rehab Admissions Coordinator Cell [phone] Office [phone]   

## 2015-01-15 NOTE — Progress Notes (Addendum)
Inpatient Diabetes Program Recommendations  AACE/ADA: New Consensus Statement on Inpatient Glycemic Control (2015)  Target Ranges:  Prepandial:   less than 140 mg/dL      Peak postprandial:   less than 180 mg/dL (1-2 hours)      Critically ill patients:  140 - 180 mg/dL   Results for YESINIA, HOLZSCHUH (MRN [id number]) as of 01/15/2015 09:17  Ref. Range 01/14/2015 06:37 01/14/2015 12:08 01/14/2015 17:02 01/14/2015 21:36 01/15/2015 03:28 01/15/2015 06:32  Glucose-Capillary Latest Ref Range: 65-99 mg/dL 94 101 (H) 318 (H) 344 (H) 238 (H) 189 (H)   Review of Glycemic Control  Diabetes history: DM 2 Outpatient Diabetes medications: Lantus 60 units BID Current orders for Inpatient glycemic control: Lantus 60 units BID, Novolog Moderate TID  Inpatient Diabetes Program Recommendations: Correction (SSI): Patient's glucose increased into the 300's after prednisone 30 mg Daily. Patient's glucose at bedtime was 344 mg/dl. Please consider adding HS scale.  Thanks,  Tama Headings RN, MSN, [facility] Inpatient Diabetes Coordinator Team Pager [phone] (8a-5p)

## 2015-01-15 NOTE — Progress Notes (Signed)
Shift event: RN paged this NP secondary to worsened right side weakness in this pt with known acute infarct. NIH score up to 12 from 7 at last check. Stat CT head obtained to r/o stroke extension or bleed. CT head neg. Pt is on ASA and Plavix. NP informed Dr. Janann Colonel, neuro on call.  [facility], NP [facility]

## 2015-01-15 NOTE — Progress Notes (Addendum)
PROGRESS NOTE    Michelle Holmes [id number] DOB: [birth year] DOA: 01/13/2015 PCP: Charolette Forward, MD  HPI/Brief narrative 64 year old female with history of COPD, OSA/OHS, chronic respiratory failure on home oxygen 2 L/m, HTN, HLD, chronic diastolic CHF, CAD, stage IV CKD, DM 2, colon cancer and CVA, presented to the ED with right-sided weakness that began at 10:30 PM on 01/12/15. She also had elevated blood sugar of 504 in the emergency department. Admitted for stroke evaluation. Neurology consulted.   Assessment/Plan:  Acute left brain stroke - resultant R hemiplegia - Etiology: Possibly small vessel disease - MRI brain: Limited study. 11 x 9 cm acute ischemic infarct centered at the inferior left thalamus/left cerebral peduncle, with adjacent 11 mm curvilinear infarct within the posterior limb of the left internal capsule/Mecial left temporal lobe & additional punctate foci of diffusion abnormality within the right basal ganglia also suspicious for tiny acute ischemic infarcts - MRA brain: Patient has been consistently refusing - TCD pending - Carotid Dopplers: No plaque or stenosis seen in ICAs bilaterally. Vertebral artery flow is antegrade - 2-D echo: LVEF 55-60 percent and grade 1 diastolic dysfunction. - [id number]: Pending.  - Lipid panel: Unable to calculate LDL secondary to very high triglycerides. - Patient was on aspirin 81 MG + Plavix 75 MG PTA-continue same. - Neurology consultation appreciated. Stroke team follow-up pending. - Therapies: Recommend CIR. Patient agreed this morning but apparently was reluctant when neurology saw her. - Neurology follow-up appreciated. Discussed with Dr. Leonie Man  Uncontrolled DM 2/IDDM - Claims compliance with Lantus but not to her diet. - Resumed home dose of Lantus 60 units twice a day and added SSI. - Hemoglobin A1c 06/20/14:11.9. Goal <7. -Seems to have worsened after prednisone she received in ED.  - We will add at bedtime SSI and mealtime  Novolog  Essential hypertension - Allow for permissive hypertension. Currently on metoprolol and losartan. Hold amlodipine & losartan to allow for permissive hypertension  COPD, OSA/OHS, chronic respiratory failure with hypoxia - Stable. Continue oxygen. Former smoker.  Morbid obesity/Body mass index is 37.86 kg/(m^2).  Chronic diastolic CHF - Compensated  Hyperlipidemia/hypertriglyceridemia - LDL goal <70. Continue atorvastatin.Unable to calculate LDL secondary to very high triglycerides.? Compliance. Change gemfibrozil to fenofibrate to decrease risk of rhabdo, as per pharmacy recommendation.  Anemia - Stable  Stage IV chronic kidney disease/metabolic acidosis - Baseline creatinine: 2.7. Creatinine at baseline. Recommend outpatient nephrology consultation and follow-up.   DVT prophylaxis: Lovenox Code Status: Full Family Communication: None at bedside.  Disposition Plan: Dc when medically stable. Disposition to be determined.   Consultants:  Neurology  Procedures:  2-D echo 01/14/15: Study Conclusions  - Left ventricle: The cavity size was normal. Wall thickness was increased in a pattern of moderate LVH. Systolic function was normal. The estimated ejection fraction was in the range of 55% to 60%. Wall motion was normal; there were no regional wall motion abnormalities. Doppler parameters are consistent with abnormal left ventricular relaxation (grade 1 diastolic dysfunction). - Atrial septum: No defect or patent foramen ovale was identified.  Carotid Dopplers: Carotid duplex completed. No plaque or stenosis seen in ICA's bilaterally. Vertebral artery flow is antegrade.   Antibiotics:  None   Subjective: Overnight events noted. Worsening right-sided weakness. CT head repeated and no worsening noted. Denies new complaints.  Objective: Filed Vitals:   01/14/15 2136 01/15/15 0128 01/15/15 0335 01/15/15 0539  BP: 102/54 113/48 111/62 104/55    Pulse: 58 56 53 60  Temp: 98.4 F (36.9  C) 97.6 F (36.4 C) 98.4 F (36.9 C) 97.5 F (36.4 C)  TempSrc: Axillary Axillary Axillary Axillary  Resp: 16 16 16 17   Height:      Weight:      SpO2: 100% 100% 100% 100%   No intake or output data in the 24 hours ending 01/15/15 0741 Filed Weights   01/14/15 0046  Weight: 103.193 kg (227 lb 8 oz)     Exam:  General exam: Moderately built and obese pleasant middle-aged female lying comfortably propped up in bed. Respiratory system: Clear. No increased work of breathing. Cardiovascular system: S1 & S2 heard, RRR. No JVD, murmurs, gallops, clicks or pedal edema. Telemetry: SB 50's-SR. Gastrointestinal system: Abdomen is nondistended, soft and nontender. Normal bowel sounds heard. Central nervous system: Alert and oriented. No focal neurological deficits. Extremities: Symmetric 5 x 5 power in left limbs. Grade 2 x 5 power in the right limbs-clearly worse than yesterday.  Data Reviewed: Basic Metabolic Panel:  Recent Labs Lab 01/12/15 1336 01/13/15 2133  NA 131* 135  K 5.4* 4.3  CL 101 109  CO2 19* 17*  GLUCOSE 504* 258*  BUN 65* 61*  CREATININE 2.70* 2.69*  CALCIUM 9.6 9.3   Liver Function Tests:  Recent Labs Lab 01/13/15 2133  AST 13*  ALT 15  ALKPHOS 80  BILITOT 0.4  PROT 6.8  ALBUMIN 3.5   No results for input(s): LIPASE, AMYLASE in the last 168 hours. No results for input(s): AMMONIA in the last 168 hours. CBC:  Recent Labs Lab 01/12/15 1336 01/13/15 2133  WBC 6.5 7.4  NEUTROABS  --  4.5  HGB 10.6* 10.2*  HCT 30.7* 30.3*  MCV 91.9 93.2  PLT 165 167   Cardiac Enzymes: No results for input(s): CKTOTAL, CKMB, CKMBINDEX, TROPONINI in the last 168 hours. BNP (last 3 results) No results for input(s): PROBNP in the last 8760 hours. CBG:  Recent Labs Lab 01/14/15 1208 01/14/15 1702 01/14/15 2136 01/15/15 0328 01/15/15 0632  GLUCAP 101* 318* 344* 238* 189*    No results found for this or any  previous visit (from the past 240 hour(s)).         Studies: Dg Chest 2 View  01/14/2015  CLINICAL DATA:  TIA symptoms, no acute chest complaints, history of COPD and CHF and coronary angioplasty. EXAM: CHEST  2 VIEW COMPARISON:  Chest x-ray of Jun 20, 2014 FINDINGS: The lungs are borderline hypoinflated which not a new finding. The cardiac silhouette remains enlarged. The pulmonary interstitial markings are minimally prominent but not significantly changed from the previous study. There is marked reverse S shaped thoracolumbar scoliosis. On the lateral film increased density projects over the mid to lower thoracic spine but this is stable IMPRESSION: Mild interstitial prominence of both lungs is partially due to borderline hypoinflation,but low-grade interstitial edema or other interstitial process may be present. Electronically Signed   By: David  Martinique M.D.   On: 01/14/2015 07:32   Ct Head Wo Contrast  01/15/2015  CLINICAL DATA:  Sequela of cerebral infarction EXAM: CT HEAD WITHOUT CONTRAST TECHNIQUE: Contiguous axial images were obtained from the base of the skull through the vertex without intravenous contrast. COMPARISON:  Two days ago FINDINGS: The acute left subthalamic lacunar infarct shows no hemorrhagic conversion or evidence of growth. Stable chronic ischemic injury in the bilateral cerebral white matter, left putamen, and in the anterior left frontal cortex. No new site of infarct is identified. No hydrocephalus, shift, or mass lesion. Diffuse intracranial calcified atherosclerosis.  No new orbital or osseous findings. Clear visualized paranasal sinuses and mastoids. IMPRESSION: Stable exam. No hemorrhagic conversion or evidence of infarct extension. Electronically Signed   By: Monte Fantasia M.D.   On: 01/15/2015 03:33   Ct Head Wo Contrast  01/13/2015  CLINICAL DATA:  Right-sided weakness. EXAM: CT HEAD WITHOUT CONTRAST TECHNIQUE: Contiguous axial images were obtained from the base  of the skull through the vertex without intravenous contrast. COMPARISON:  02/07/2012 FINDINGS: There is encephalomalacia within the left frontal lobe compatible with chronic infarct. This is new since 02/07/2012. Patchy areas of low-attenuation are identified 2 areas of subcortical low attenuation within the posterior right parietal lobe are identified and appear unchanged from previous exam. Prominence of the sulci and ventricles noted compatible with mild brain atrophy. No evidence for acute intracranial hemorrhage, mass or acute brain infarct. No abnormal extra-axial fluid collections identified. The paranasal sinuses and mastoid air cells are clear. The calvarium is intact. IMPRESSION: 1. No acute intracranial abnormalities. 2. Left frontal lobe encephalomalacia compatible with chronic infarct. New from previous exam. 3. Small vessel ischemic change, similar to previous exam. Electronically Signed   By: Kerby Moors M.D.   On: 01/13/2015 22:04   Mr Brain Ltd W/o Cm  01/14/2015  CLINICAL DATA:  Initial evaluation for acute exacerbation of right-sided weakness. EXAM: MRI HEAD WITHOUT CONTRAST TECHNIQUE: Multiplanar, multiecho pulse sequences of the brain and surrounding structures were obtained without intravenous contrast. COMPARISON:  Prior CT from 01/13/2015. FINDINGS: Study is limited as only axial diffusion and sagittal T1 weighted sequences were performed. Diffusion-weighted imaging demonstrates a 11 x 9 mm focus of restricted diffusion within the left thalamus extending towards the left cerebral peduncle (series 4, image 23). Additional small 11 mm curvilinear focus of restricted diffusion within knee adjacent posterior left internal capsule/ mesial left temporal lobe. No associated mass effect. Additional tiny punctate focus of restricted diffusion at the level of the right lentiform nucleus (series 4, image 26), also suspicious for a tiny acute infarct. No other infarct identified on this limited  exam. Encephalomalacia within the left frontal lobe noted. No mass effect or midline shift. No hydrocephalus. No definite extra-axial fluid collection. Craniocervical junction within normal limits. Pituitary gland normal. Bone marrow signal intensity within normal limits. Scalp soft tissues are unremarkable. IMPRESSION: 1. Limited study with only diffusion and sagittal T1 weighted sequences performed. 2. 11 x 9 mm acute ischemic infarct centered at the inferior left thalamus/left cerebral peduncle, with adjacent 11 mm curvilinear infarct within the posterior limb of the left internal capsule/mesial left temporal lobe. No associated mass effect. 3. Additional punctate foci of diffusion abnormality within the right basal ganglia as above, also suspicious for tiny acute ischemic infarcts. 4. No other definite acute intracranial process on this limited study. Electronically Signed   By: Jeannine Boga M.D.   On: 01/14/2015 03:24        Scheduled Meds: . aspirin EC  81 mg Oral Daily  . atorvastatin  40 mg Oral q1800  . clopidogrel  75 mg Oral Daily  . enoxaparin (LOVENOX) injection  40 mg Subcutaneous QHS  . ferrous sulfate  325 mg Oral BID WC  . gemfibrozil  600 mg Oral BID AC  . insulin aspart  0-15 Units Subcutaneous TID WC  . insulin glargine  60 Units Subcutaneous BID  . losartan  100 mg Oral Daily  . metoprolol succinate  100 mg Oral Daily  . mometasone-formoterol  2 puff Inhalation BID  . pantoprazole  40 mg Oral Daily  . pregabalin  50 mg Oral BID  . sodium chloride  3 mL Intravenous Q12H  . sodium chloride  3 mL Intravenous Q12H   Continuous Infusions:   Principal Problem:   CVA (cerebral vascular accident) ([place]) Active Problems:   Uncontrolled type 2 DM with hyperosmolar nonketotic hyperglycemia (H[facility])   CHF (congestive heart failure) ([facility])   Chronic renal failure   CAD (coronary artery disease)   Right sided weakness   CVA (cerebral infarction)   Right hemiplegia  ([place])    Time spent: 30 minutes.    Vernell Leep, MD, FACP, FHM. [facility] Pager [phone]  If 7PM-7AM, please contact night-coverage [email or web address] Password [email or web address] 01/15/2015, 7:41 AM    LOS: 2 days

## 2015-01-15 NOTE — Evaluation (Signed)
Speech Language Pathology Evaluation Patient Details Name: Michelle Holmes MRN: [id number] DOB: 1950-12-31 Today's Date: 01/15/2015 Time: [id number] SLP Time Calculation (min) (ACUTE ONLY): 23 min  Problem List:  Patient Active Problem List   Diagnosis Date Noted  . CVA (cerebral vascular accident) ([place]) 01/14/2015  . CVA (cerebral infarction) 01/14/2015  . Right hemiplegia ([place])   . Right sided weakness 01/13/2015  . Abdominal pain 06/20/2014  . Altered mental status 02/08/2012  . Sleep apnea 12/08/2010  . Morbid obesity ([place]) 12/08/2010  . CHF (congestive heart failure) ([place]) 12/08/2010  . Chronic renal failure 12/08/2010  . TIA (transient ischemic attack) 12/08/2010  . CAD (coronary artery disease) 12/08/2010  . Cellulitis of pubic region 12/01/2010  . Uncontrolled type 2 DM with hyperosmolar nonketotic hyperglycemia ([place]) 12/01/2010   Past Medical History:  Past Medical History  Diagnosis Date  . Emphysema   . Colon cancer ([place])   . Hypertension   . Hypercholesteremia   . Diabetes mellitus   . COPD (chronic obstructive pulmonary disease) ([place])   . Coronary artery disease   . Obesity   . Sleep apnea   . TIA (transient ischemic attack)   . CHF (congestive heart failure) ([place])   . Anemia   . DJD (degenerative joint disease)   . Renal insufficiency   . Chronic back pain   . Scoliosis   . Obesity hypoventilation syndrome ([place])   . Stroke ([place])   . Myocardial infarction [facility])    Past Surgical History:  Past Surgical History  Procedure Laterality Date  . Coronary angioplasty with stent placement    . Colon surgery     HPI:  64 yo female adm to [facility] with right sided weakness,  Pt found to have left thalamic CVA.  PMH + for emphysema, prior CVA, disabled.  Speech evaluation ordered.  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    Assessment / Plan / Recommendation Clinical Impression  Pt presents with moderate dysarthria with imprecise  articulation due to hypoglossal, facial and trigeminal nerve involvements.  Lingual deviation to right upon protrusion noted with pt awareness.  Orientation to situation, self and location present however pt benefited from cues to be oriented to date.  She demonstrated difficulty with stating home address and phone number.  Verbal, visual cues required to assist pt to  locate call bell on the right side of her bed demonstrating decreased problem solving abilities.  Pt reports she was independently managing home medications and bills prior to admission - no family present to confirm.  Pt will benefit from SLP follow up to maximize pt's rehabiliation and decrease caregiver burden.   Pt agreeable to plan.      SLP Assessment  Patient needs continued Speech Lanaguage Pathology Services    Follow Up Recommendations  Inpatient Rehab (if pt has 24/7 supervision at dc)    Frequency and Duration min 2x/week  2 weeks      SLP Evaluation Prior Functioning   Lives With: Family (lives with cousin) Available Help at Discharge:  (pt reports cousin does leave house at times) Vocation: On disability   Cognition  Arousal/Alertness: Awake/alert Orientation Level: Oriented to person;Oriented to place;Disoriented to time;Oriented to situation Attention: Sustained Sustained Attention: Appears intact Memory: Appears intact (recalled had a cva) Awareness: Appears intact Problem Solving: Impaired Problem Solving Impairment: Functional basic (to locate call bell in bed on her right side, verbal/tactile cues required) Behaviors: Restless Safety/Judgment: Appears intact (need to call for  assistance due ot sensory and motor deficits) Comments: pt able to name 4 animals in 60 seconds    Comprehension  Auditory Comprehension Yes/No Questions: Not tested Commands: Within Functional Limits (for few step commands) Conversation: Complex Visual Recognition/Discrimination Discrimination: Not tested Reading  Comprehension Reading Status: Not tested    Expression Expression Primary Mode of Expression: Verbal Verbal Expression Initiation: No impairment Repetition: No impairment Naming: No impairment Pragmatics: No impairment Written Expression Dominant Hand: Right Written Expression: Not tested (pt with right sided weakness)   Oral / Motor Oral Motor/Sensory Function Overall Oral Motor/Sensory Function: Moderate impairment (pt stated left side was weak when right side deficits predominant) Facial ROM: Reduced right;Suspected CN VII (facial) dysfunction Facial Symmetry: Abnormal symmetry right;Suspected CN VII (facial) dysfunction Facial Strength: Reduced right;Suspected CN VII (facial) dysfunction Facial Sensation: Reduced right;Suspected CN V (Trigeminal) dysfunction Lingual ROM: Reduced right;Suspected CN XII (hypoglossal) dysfunction Lingual Symmetry: Abnormal symmetry right Lingual Strength: Suspected CN XII (hypoglossal) dysfunction Lingual Sensation: Suspected CN VII (facial) dysfunction-anterior 2/3 tongue Mandible: Other (Comment) (DNT) Motor Speech Overall Motor Speech: Impaired Respiration: Impaired Phonation: Low vocal intensity Articulation: Impaired Level of Impairment: Phrase Intelligibility: Intelligibility reduced Word: 75-100% accurate Phrase: 75-100% accurate Sentence: Not tested Conversation: Not tested Motor Planning: Witnin functional limits Motor Speech Errors: Not applicable Effective Techniques: Slow rate;Over-articulate    Janett Labella [place], [place] [facility] SLP [phone]

## 2015-01-15 NOTE — Progress Notes (Signed)
Occupational Therapy Evaluation Patient Details Name: Michelle Holmes MRN: [id number] DOB: September 15, 1950 Today's Date: 01/15/2015    History of Present Illness 64 y.o. female admitted for    Clinical Impression   Unsure of pt's PLOF - no family present and pt is a poor historian. Pt currently presents with R hemiplegia, R inattention, impaired balance, decrease safety and deficit awareness, and cognitive deficits. Pt required max +2 assist to complete sit-stand and stand-pivot transfers and pericare. Pt will benefit from acute skilled OT to increase independence and safety with ADLs and mobility to allow safe discharge to venue listed below. Recommending CIR (if pt has 24/7 assistance at home upon d/c).     Follow Up Recommendations  CIR    Equipment Recommendations  Other (comment) (TBD in next venue)    Recommendations for Other Services Rehab consult     Precautions / Restrictions Precautions Precautions: Fall Restrictions Weight Bearing Restrictions: No      Mobility Bed Mobility Overal bed mobility: Needs Assistance Bed Mobility: Rolling;Sidelying to Sit Rolling: Mod assist Sidelying to sit: Max assist;HOB elevated       General bed mobility comments: HOB elevated, use of bedrail. Rolled to R side with mod assist to trunk. Max assist to progress legs off bed and to support trunk to come to sitting position.  Transfers Overall transfer level: Needs assistance Equipment used: 2 person hand held assist Transfers: Sit to/from Stand Sit to Stand: Max assist;+2 physical assistance         General transfer comment: Max +2 assist for boost to stand and to move RLE during step sequence for stand-pivot transfers to BSC/chair. Verbal and tactile cues to protect RUE during transfer.    Balance Overall balance assessment: Needs assistance Sitting-balance support: Bilateral upper extremity supported;Feet supported Sitting balance-Leahy Scale: Fair   Postural control:  Posterior lean (may be due to body habitus) Standing balance support: Single extremity supported;During functional activity Standing balance-Leahy Scale: Zero Standing balance comment: Requires LUE support and max assist +2 to maintain standing balance                            ADL Overall ADL's : Needs assistance/impaired                         Toilet Transfer: Maximal assistance;+2 for physical assistance;Cueing for safety;Cueing for sequencing;Stand-pivot;BSC   Toileting- Clothing Manipulation and Hygiene: Maximal assistance;+2 for physical assistance;Cueing for safety;Cueing for sequencing;Sit to/from stand       Functional mobility during ADLs: Maximal assistance;+2 for physical assistance;Cueing for safety;Cueing for sequencing General ADL Comments: Pt with decreased awareness of deficits and impaired cognition. Pt reufsed post-acute rehab when MD suggested during session and became agitated. Pt is max +2 assist for transfers and requires mod verbal cues to attend to RUE and RLE.      Vision Additional Comments: Unsure of pt's visuion baseline. Pt refusing to answer questions or complete vision screening   Perception Perception Perception Tested?: Yes Perception Deficits: Inattention/neglect Inattention/Neglect: Does not attend to right side of body   Praxis      Pertinent Vitals/Pain Pain Assessment: No/denies pain     Hand Dominance Right   Extremity/Trunk Assessment Upper Extremity Assessment Upper Extremity Assessment: RUE deficits/detail RUE Deficits / Details: Flaccid UE, 1/5 biceps RUE Sensation: decreased light touch;decreased proprioception RUE Coordination: decreased fine motor;decreased gross motor   Lower Extremity Assessment Lower Extremity Assessment:  RLE deficits/detail RLE Deficits / Details: Pt with trace movement to progress leg off bed, required total assist move while ambulating RLE Sensation: decreased light  touch;decreased proprioception RLE Coordination: decreased gross motor   Cervical / Trunk Assessment Cervical / Trunk Assessment: Kyphotic   Communication Communication Communication: Expressive difficulties   Cognition Arousal/Alertness: Awake/alert Behavior During Therapy: Agitated Overall Cognitive Status: Impaired/Different from baseline Area of Impairment: Attention;Memory;Awareness;Following commands;Safety/judgement;Problem solving;Orientation Orientation Level: Situation Current Attention Level: Sustained Memory: Decreased recall of precautions;Decreased short-term memory Following Commands: Follows one step commands inconsistently;Follows one step commands with increased time Safety/Judgement: Decreased awareness of safety;Decreased awareness of deficits Awareness: Intellectual Problem Solving: Slow processing;Decreased initiation;Difficulty sequencing;Requires verbal cues;Requires tactile cues General Comments: Unsure fo cognitive baseline. Pt became agitated intermittently throughout session with no obvious cause.    General Comments       Exercises       Shoulder Instructions      Home Living Family/patient expects to be discharged to:: Private residence Living Arrangements: Other relatives (Godson and Film/video editor) Available Help at Discharge: Family;Available 24 hours/day Type of Home: House Home Access: Level entry     Home Layout: One level     Bathroom Shower/Tub: Walk-in shower;Door         Home Equipment: Kasandra Knudsen - single point   Additional Comments: Unsure of accuracy of pt's responses. Pt became angry and refused to answer any more questions.  Lives With: Family    Prior Functioning/Environment          Comments: Unsure of PLOF. No family present.     OT Diagnosis: Generalized weakness;Cognitive deficits;Disturbance of vision;Hemiplegia dominant side   OT Problem List: Decreased strength;Decreased range of motion;Decreased activity  tolerance;Decreased coordination;Impaired vision/perception;Impaired balance (sitting and/or standing);Decreased cognition;Decreased safety awareness;Decreased knowledge of use of DME or AE;Decreased knowledge of precautions;Impaired sensation;Impaired tone;Obesity;Impaired UE functional use   OT Treatment/Interventions: Self-care/ADL training;Therapeutic exercise;Neuromuscular education;Energy conservation;DME and/or AE instruction;Therapeutic activities;Cognitive remediation/compensation;Visual/perceptual remediation/compensation;Patient/family education;Balance training    OT Goals(Current goals can be found in the care plan section) Acute Rehab OT Goals Patient Stated Goal: none stated OT Goal Formulation: With patient Time For Goal Achievement: 01/29/15 Potential to Achieve Goals: Fair ADL Goals Pt Will Perform Grooming: with min assist;sitting (incorporating RUE with min verbal cues) Pt Will Perform Upper Body Bathing: with min assist;sitting (incorporating RUE with min verbal cues) Pt Will Perform Lower Body Bathing: with min assist;sitting/lateral leans (incorporating RUE with min verbal cues) Pt Will Transfer to Toilet: with mod assist;ambulating;bedside commode Pt Will Perform Toileting - Clothing Manipulation and hygiene: with mod assist;sitting/lateral leans;sit to/from stand  OT Frequency: Min 3X/week   Barriers to D/C:    Unsure of pt's home situation and assistance available       Co-evaluation              End of Session Equipment Utilized During Treatment: Gait belt Nurse Communication: Mobility status;Other (comment);Need for lift equipment (Pillow underneath RUE at all times)  Activity Tolerance: Patient tolerated treatment well Patient left: in chair;with call bell/phone within reach;with chair alarm set   Time: [id number] OT Time Calculation (min): 30 min Charges:  OT General Charges $OT Visit: 1 Procedure OT Evaluation $Initial OT Evaluation Tier I: 1  Procedure OT Treatments $Self Care/Home Management : 8-22 mins G-Codes:    Redmond Baseman, OTR/L 2015-02-14, 12:26 PM

## 2015-01-15 NOTE — Progress Notes (Signed)
STROKE TEAM PROGRESS NOTE   HISTORY Michelle Holmes is an 64 y.o. female with history of diabetes mellitus, hypertension, hypercholesterolemia, COPD, previous stroke, and coronary artery disease, presenting with exacerbation of weakness involving her right side starting at 10:30 PM last night. Patient has also had abated blood sugar, including a level of 504 in the emergency room today. His been taking aspirin and Plavix daily. CT scan of her head acute changes. Frontal encephalomalacia from previous stroke was noted. NIH stroke score was 7.  LSN: 10:30 PM on 01/12/2015 tPA Given: No: Beyond time under for treatment consideration mRankin:   SUBJECTIVE (INTERVAL HISTORY) Patient had worsening of right hemiplegia with this morning. Stat CT scan of the head showed no acute abnormality. She seems to be improving somewhat now. She has been felt to be candidate for inpatient rehabilitation but patient seems to be refusing to go there and wants to go home OBJECTIVE Temp:  [97.5 F (36.4 C)-98.6 F (37 C)] 97.9 F (36.6 C) (12/20 0938) Pulse Rate:  [51-60] 59 (12/20 0943) Cardiac Rhythm:  [-] Normal sinus rhythm (12/20 0800) Resp:  [16-20] 20 (12/20 0943) BP: (102-140)/(48-72) 128/72 mmHg (12/20 0943) SpO2:  [98 %-100 %] 98 % (12/20 0943)  CBC:   Recent Labs Lab 01/12/15 1336 01/13/15 2133  WBC 6.5 7.4  NEUTROABS  --  4.5  HGB 10.6* 10.2*  HCT 30.7* 30.3*  MCV 91.9 93.2  PLT 165 [id number]    Basic Metabolic Panel:   Recent Labs Lab 01/12/15 1336 01/13/15 2133  NA 131* 135  K 5.4* 4.3  CL 101 109  CO2 19* 17*  GLUCOSE 504* 258*  BUN 65* 61*  CREATININE 2.70* 2.69*  CALCIUM 9.6 9.3    Lipid Panel:     Component Value Date/Time   CHOL 224* 01/15/2015 0534   TRIG 579* 01/15/2015 0534   HDL 22* 01/15/2015 0534   CHOLHDL 10.2 01/15/2015 0534   VLDL UNABLE TO CALCULATE IF TRIGLYCERIDE OVER 400 mg/dL 01/15/2015 0534   LDLCALC UNABLE TO CALCULATE IF TRIGLYCERIDE OVER 400 mg/dL  01/15/2015 0534   HgbA1c:  Lab Results  Component Value Date   HGBA1C 11.9* 06/20/2014   Urine Drug Screen:     Component Value Date/Time   LABOPIA NONE DETECTED 01/13/2015 2238   COCAINSCRNUR NONE DETECTED 01/13/2015 2238   LABBENZ NONE DETECTED 01/13/2015 2238   AMPHETMU NONE DETECTED 01/13/2015 2238   THCU NONE DETECTED 01/13/2015 2238   LABBARB NONE DETECTED 01/13/2015 2238      IMAGING  Dg Chest 2 View 01/14/2015   Mild interstitial prominence of both lungs is partially due to borderline hypoinflation,but low-grade interstitial edema or other interstitial process may be present.  Ct Head Wo Contrast 01/13/2015   1. No acute intracranial abnormalities.  2. Left frontal lobe encephalomalacia compatible with chronic infarct. New from previous exam.  3. Small vessel ischemic change, similar to previous exam.    Mr Brain Ltd W/o Cm  01/14/2015   1. Limited study with only diffusion and sagittal T1 weighted sequences performed.  2. 11 x 9 mm acute ischemic infarct centered at the inferior left thalamus/left cerebral peduncle, with adjacent 11 mm curvilinear infarct within the posterior limb of the left internal capsule/mesial left temporal lobe. No associated mass effect.  3. Additional punctate foci of diffusion abnormality within the right basal ganglia as above, also suspicious for tiny acute ischemic infarcts.  4. No other definite acute intracranial process on this limited study.  PHYSICAL EXAM Pleasant middle-aged lady not in distress. . Afebrile. Head is nontraumatic. Neck is supple without bruit.    Cardiac exam no murmur or gallop. Lungs are clear to auscultation. Distal pulses are well felt. Neurological Exam :  Awake alert oriented 3. Mild dysarthria but no aphasia. Follows commands well. Extraocular moments are full range without nystagmus. Fundi were not visualized. Vision acuity seems adequate. Mild right lower facial weakness. Tongue midline. Motor  system exam revealed moderate right hemiplegia 3/5 with weakness of right grip and intrinsic hand muscles. Slight right upper extremity drift. Mild weakness of right hip flexors and ankle dorsiflexors. Tone is increased on the right side. Deep tendon reflexes are brisker on the right compared to the left. Right plantar is upgoing left is downgoing. Sensation is diminished in the right hemibody. Gait was not tested. Coordination is slightly impaired on the right compared to the left. ASSESSMENT/PLAN [name] is a 63 y.o. female with history of diabetes mellitus, hypertension, hypercholesterolemia, COPD, previous stroke, and coronary artery disease presenting with exacerbation of weakness involving her right side . She did not receive IV t-PA due to late presentation.  Stroke:  Dominant infarcts possibly   from small vessel disease  Resultant  right hemiplegia  MRI  as noted above  MRA  not performed  TCD's - pending  Carotid Doppler - No significant plaque or stenosis at either carotid bifurcations. Bilateral vertebral artery flow is  antegrade.  2D Echo  EF 55-60%. No cardiac source of emboli identified.  LDL unable to calculate secondary to triglycerides of 712  HgbA1c pending  VTE prophylaxis - not Diet heart healthy/carb modified Room service appropriate?: Yes; Fluid consistency:: Thin  aspirin 81 mg daily and clopidogrel 75 mg daily prior to admission, now on aspirin 81 mg daily and clopidogrel 75 mg daily  Patient counseled to be compliant with her antithrombotic medications  Ongoing aggressive stroke risk factor management  Therapy recommendations: CLR  Disposition: Pending  Hypertension  Stable Permissive hypertension (OK if < 220/120) but gradually normalize in 5-7 days  Hyperlipidemia  Home meds:  Lipitor 40 mg daily resumed in hospital. Also on Lopid 600 mg twice a day prior to admission.  LDL could not be calculated, goal < 70  Continue Lipitor 40 mg  daily since patient is also on Lopid - confirm compliance with medications.  Continue statin at discharge  Diabetes  HgbA1c pending, goal < 7.0  Uncontrolled  Other Stroke Risk Factors  Advanced age  Cigarette smoker, quit smoking   Obesity, Body mass index is 37.86 kg/(m^2).   Hx stroke/TIA  Coronary artery disease   Other Active Problems  Anemia  Renal insufficiency   Hospital day # 2     I have personally examined this patient, reviewed notes, independently viewed imaging studies, participated in medical decision making and plan of care. I have made any additions or clarifications directly to the above note. Agree with note above. She presented with worsening of previous right-sided weakness secondary to new left brain subcortical infarct etiology likely small vessel disease. Patient will benefit with transfer to inpatient rehabilitation but seems to be refusing. Recommend discussion with family members to try to convince her. Follow hemoglobin A1c and transcranial Doppler results. Antony Contras, MD Medical Director [facility] Pager: [phone] 01/15/2015 2:09 PM     To contact Stroke Continuity provider, please refer to [email or web address] After hours, contact General Neurology

## 2015-01-15 NOTE — Consult Note (Addendum)
Physical Medicine and Rehabilitation Consult Reason for Consult: Left subthalamic lacunar infarct Referring Physician: [facility]   HPI: Michelle Holmes is a 64 y.o. right hand is female with history of COPD, systolic congestive heart failure, hypertension, diabetes mellitus peripheral neuropathy, chronic renal insufficiency with baseline creatinine 2.69, CAD with stenting maintained on aspirin and Plavix. History taken from chart review and patient.  Patient lives with family and used a cane prior to admission. One level home. She has assistance as needed from her daughter 4/7. Presented 01/14/2015 with right-sided weakness, slurred speech and blood sugar 504. MRI of the brain showed acute ischemic infarct centered at the inferior left thalamus/left cerebral peduncle with additional punctate foci of diffusion abnormality within the right basal ganglia. Echocardiogram with ejection fraction of 123456 grade 1 diastolic dysfunction. Carotid Dopplers with no ICA stenosis. Patient did not receive TPA. Neurology consulted presently maintained on aspirin and Plavix for CVA prophylaxis. Subcutaneous Lovenox for DVT prophylaxis. Tolerating a regular diet. Hemoglobin A1c 13.9 with insulin therapy as directed. Physical therapy evaluation completed 01/15/2015 with recommendations of physical medicine rehabilitation consult.  Pt very somnolent this AM and part of the day yesterday.  CT head ordered yesterday, which was stable, repeat ordered today.  ABG ordered as well.  Review of Systems  Unable to perform ROS: acuity of condition   Past Medical History  Diagnosis Date  . Emphysema   . Colon cancer ([place])   . Hypertension   . Hypercholesteremia   . Diabetes mellitus   . COPD (chronic obstructive pulmonary disease) ([place])   . Coronary artery disease   . Obesity   . Sleep apnea   . TIA (transient ischemic attack)   . CHF (congestive heart failure) ([place])   . Anemia   . DJD (degenerative joint disease)   .  Renal insufficiency   . Chronic back pain   . Scoliosis   . Obesity hypoventilation syndrome ([place])   . Stroke ([place])   . Myocardial infarction [facility])    Past Surgical History  Procedure Laterality Date  . Coronary angioplasty with stent placement    . Colon surgery     History reviewed. No pertinent family history. Social History:  reports that she has quit smoking. She does not have any smokeless tobacco history on file. She reports that she does not drink alcohol or use illicit drugs. Allergies:  Allergies  Allergen Reactions  . Penicillins Hives and Swelling    Has patient had a PCN reaction causing immediate rash, facial/tongue/throat swelling, SOB or lightheadedness with hypotension: yes Has patient had a PCN reaction causing severe rash involving mucus membranes or skin necrosis: no Has patient had a PCN reaction that required hospitalization no Has patient had a PCN reaction occurring within the last 10 years: no If all of the above answers are "NO", then may proceed with Cephalosporin use.    Medications Prior to Admission  Medication Sig Dispense Refill  . ADVAIR DISKUS 250-50 MCG/DOSE AEPB Inhale 1 puff into the lungs 2 (two) times daily.     Marland Kitchen alprazolam (XANAX) 2 MG tablet Take 4 mg by mouth at bedtime.    Marland Kitchen amLODipine (NORVASC) 10 MG tablet Take 10 mg by mouth daily.     Marland Kitchen aspirin EC 81 MG EC tablet Take 1 tablet (81 mg total) by mouth daily. 30 tablet 3  . atorvastatin (LIPITOR) 40 MG tablet Take 40 mg by mouth daily.  3  . clopidogrel (PLAVIX) 75 MG  tablet Take 75 mg by mouth daily.  3  . ferrous sulfate 325 (65 FE) MG tablet Take 1 tablet (325 mg total) by mouth 2 (two) times daily with a meal. 60 tablet 3  . gemfibrozil (LOPID) 600 MG tablet Take 600 mg by mouth 2 (two) times daily before a meal.    . insulin glargine (LANTUS) 100 UNIT/ML injection Inject 0.6 mLs (60 Units total) into the skin 2 (two) times daily. 10 mL 11  . losartan (COZAAR) 100 MG tablet Take 100  mg by mouth daily.     Marland Kitchen LYRICA 50 MG capsule Take 50 mg by mouth 2 (two) times daily.  3  . metoprolol succinate (TOPROL-XL) 100 MG 24 hr tablet Take 100 mg by mouth daily. Take with or immediately following a meal.    . ondansetron (ZOFRAN) 4 MG tablet Take 1 tablet (4 mg total) by mouth every 6 (six) hours. (Patient taking differently: Take 4 mg by mouth every 8 (eight) hours as needed for nausea. ) 12 tablet 0  . OxyCODONE (OXYCONTIN) 80 mg T12A 12 hr tablet Take 80 mg by mouth 2 (two) times daily as needed (pain).   0  . pantoprazole (PROTONIX) 40 MG tablet Take 40 mg by mouth daily.  3  . predniSONE (DELTASONE) 5 MG tablet Tapering dose for 12 days 12-14 to 12-26  6 for 2 days, 5 for 2 days, 4 for 2 days, 3 for 2 days, 2 for 2 days, 1 for 2 days  0  . rosuvastatin (CRESTOR) 10 MG tablet Take 10 mg by mouth daily.     . traMADol (ULTRAM) 50 MG tablet Take 50 mg by mouth every 4 (four) hours as needed for moderate pain.   0    Home: Home Living Family/patient expects to be discharged to:: Private residence Living Arrangements: Other relatives Media planner and Film/video editor) Available Help at Discharge: Family, Available 24 hours/day Type of Home: House Home Access: Level entry Home Layout: One level Bathroom Shower/Tub: Gaffer, Door Home Equipment: Cane - single point Additional Comments: Unsure of accuracy of pt's responses. Pt became angry and refused to answer any more questions.  Lives With: Family  Functional History: Prior Function Comments: Unsure of PLOF. No family present.  Functional Status:  Mobility: Bed Mobility Overal bed mobility: Needs Assistance Bed Mobility: Rolling, Sidelying to Sit Rolling: Mod assist Sidelying to sit: Max assist, HOB elevated General bed mobility comments: HOB elevated, use of bedrail. Rolled to R side with mod assist to trunk. Max assist to progress legs off bed and to support trunk to come to sitting position. Transfers Overall  transfer level: Needs assistance Equipment used: None Transfers: Sit to/from Stand Sit to Stand: Max assist General transfer comment: unable to stand from recliner using armrest on Lt with one person assist. (?effort by pt as she did not want to get out of recliner)      ADL: ADL Overall ADL's : Needs assistance/impaired Toilet Transfer: Maximal assistance, +2 for physical assistance, Cueing for safety, Cueing for sequencing, Stand-pivot, BSC Toileting- Clothing Manipulation and Hygiene: Maximal assistance, +2 for physical assistance, Cueing for safety, Cueing for sequencing, Sit to/from stand Functional mobility during ADLs: Maximal assistance, +2 for physical assistance, Cueing for safety, Cueing for sequencing General ADL Comments: Pt with decreased awareness of deficits and impaired cognition. Pt reufsed post-acute rehab when MD suggested during session and became agitated. Pt is max +2 assist for transfers and requires mod verbal cues to attend to  RUE and RLE.   Cognition: Cognition Overall Cognitive Status: Difficult to assess Arousal/Alertness: Awake/alert Orientation Level: Oriented X4 Attention: Sustained Sustained Attention: Appears intact Memory: Appears intact (recalled had a cva) Awareness: Appears intact Problem Solving: Impaired Problem Solving Impairment: Functional basic (to locate call bell in bed on her right side, verbal/tactile cues required) Behaviors: Restless Safety/Judgment: Appears intact (need to call for assistance due ot sensory and motor deficits) Comments: pt able to name 4 animals in 60 seconds Cognition Arousal/Alertness: Awake/alert Behavior During Therapy: Flat affect Overall Cognitive Status: Difficult to assess Area of Impairment: Attention, Memory, Awareness, Following commands, Safety/judgement, Problem solving, Orientation Orientation Level: Situation Current Attention Level: Sustained Memory: Decreased recall of precautions, Decreased  short-term memory Following Commands: Follows one step commands inconsistently, Follows one step commands with increased time Safety/Judgement: Decreased awareness of safety, Decreased awareness of deficits Awareness: Intellectual Problem Solving: Slow processing, Decreased initiation, Difficulty sequencing, Requires verbal cues, Requires tactile cues General Comments: Unsure fo cognitive baseline. Pt became agitated intermittently throughout session with no obvious cause.  Difficult to assess due to: Impaired communication  Blood pressure 98/49, pulse 47, temperature 97.8 F (36.6 C), temperature source Oral, resp. rate 16, height 5\' 5"  (1.651 m), weight 103.193 kg (227 lb 8 oz), SpO2 99 %. Physical Exam  Vitals reviewed. Constitutional: She appears well-developed and well-nourished.  Obese  HENT:  Head: Normocephalic and atraumatic.  Right facial droop  Eyes: Conjunctivae and EOM are normal.  Neck: Normal range of motion. Neck supple. No thyromegaly present.  Cardiovascular: Normal rate and regular rhythm.   Respiratory: Effort normal and breath sounds normal. No respiratory distress.  GI: Soft. Bowel sounds are normal. She exhibits no distension.  Musculoskeletal: She exhibits no edema or tenderness.  Neurological: She has normal reflexes.  Patient extremely somnolent, and difficult to arouse A&Ox2 Able to follow simple commands when awake  Unable to assess sensation and cranial nerves due to lack of attention Motor: LUE: 4/5, limited by inattention LLE: 4/5, limited by inattention RLE: 2/5, limited by inattention  RUE: not moving, ?participation  Skin: Skin is warm and dry.  Psychiatric: Her affect is inappropriate. Her speech is delayed and slurred. She is slowed. Cognition and memory are impaired. She expresses inappropriate judgment.    Results for orders placed or performed during the hospital encounter of 01/13/15 (from the past 24 hour(s))  Glucose, capillary      Status: Abnormal   Collection Time: 01/15/15 11:30 AM  Result Value Ref Range   Glucose-Capillary 178 (H) 65 - 99 mg/dL  Glucose, capillary     Status: Abnormal   Collection Time: 01/15/15  4:20 PM  Result Value Ref Range   Glucose-Capillary 197 (H) 65 - 99 mg/dL  Glucose, capillary     Status: Abnormal   Collection Time: 01/15/15 10:21 PM  Result Value Ref Range   Glucose-Capillary 187 (H) 65 - 99 mg/dL  Basic metabolic panel     Status: Abnormal   Collection Time: 01/16/15  6:44 AM  Result Value Ref Range   Sodium 142 135 - 145 mmol/L   Potassium 4.8 3.5 - 5.1 mmol/L   Chloride 116 (H) 101 - 111 mmol/L   CO2 17 (L) 22 - 32 mmol/L   Glucose, Bld 70 65 - 99 mg/dL   BUN 69 (H) 6 - 20 mg/dL   Creatinine, Ser 3.12 (H) 0.44 - 1.00 mg/dL   Calcium 9.1 8.9 - 10.3 mg/dL   GFR calc non Af Amer 15 (L) >  60 mL/min   GFR calc Af Amer 17 (L) >60 mL/min   Anion gap 9 5 - 15  CBC     Status: Abnormal   Collection Time: 01/16/15  6:44 AM  Result Value Ref Range   WBC 9.9 4.0 - 10.5 K/uL   RBC 3.48 (L) 3.87 - 5.11 MIL/uL   Hemoglobin 11.0 (L) 12.0 - 15.0 g/dL   HCT 33.1 (L) 36.0 - 46.0 %   MCV 95.1 78.0 - 100.0 fL   MCH 31.6 26.0 - 34.0 pg   MCHC 33.2 30.0 - 36.0 g/dL   RDW 15.9 (H) 11.5 - 15.5 %   Platelets 179 150 - 400 K/uL  Glucose, capillary     Status: None   Collection Time: 01/16/15  6:45 AM  Result Value Ref Range   Glucose-Capillary 67 65 - 99 mg/dL  Glucose, capillary     Status: Abnormal   Collection Time: 01/16/15  7:02 AM  Result Value Ref Range   Glucose-Capillary 186 (H) 65 - 99 mg/dL  Glucose, capillary     Status: None   Collection Time: 01/16/15  9:30 AM  Result Value Ref Range   Glucose-Capillary 76 65 - 99 mg/dL  Glucose, capillary     Status: None   Collection Time: 01/16/15 10:44 AM  Result Value Ref Range   Glucose-Capillary 65 65 - 99 mg/dL   Ct Head Wo Contrast  01/15/2015  CLINICAL DATA:  Sequela of cerebral infarction EXAM: CT HEAD WITHOUT CONTRAST  TECHNIQUE: Contiguous axial images were obtained from the base of the skull through the vertex without intravenous contrast. COMPARISON:  Two days ago FINDINGS: The acute left subthalamic lacunar infarct shows no hemorrhagic conversion or evidence of growth. Stable chronic ischemic injury in the bilateral cerebral white matter, left putamen, and in the anterior left frontal cortex. No new site of infarct is identified. No hydrocephalus, shift, or mass lesion. Diffuse intracranial calcified atherosclerosis. No new orbital or osseous findings. Clear visualized paranasal sinuses and mastoids. IMPRESSION: Stable exam. No hemorrhagic conversion or evidence of infarct extension. Electronically Signed   By: Monte Fantasia M.D.   On: 01/15/2015 03:33   Dg Abd Portable 1v  01/16/2015  CLINICAL DATA:  Constipation and vomiting episodes this morning. EXAM: PORTABLE ABDOMEN - 1 VIEW COMPARISON:  CT 02/10/2014 and abdominal radiographs 06/20/2014 FINDINGS: Again noted is marked dextroscoliosis of the thoracolumbar spine. Few surgical clips scattered throughout the abdomen and pelvis. There is gas within the small and large bowel. Small amount amount of stool in the colon. Multiple pelvic calcifications. IMPRESSION: Nonobstructive bowel gas pattern Electronically Signed   By: Markus Daft M.D.   On: 01/16/2015 09:52    Assessment/Plan: Diagnosis: Left subthalamic lacunar infarct Labs and images independently reviewed.  Records reviewed and summated above. Stroke: Continue secondary stroke prophylaxis and Risk Factor Modification listed below:   Antiplatelet therapy Blood Pressure Management:  Continue current medication with prn's with permisive HTN per primary team Statin Agent Diabetes management Tobacco abuse Right sided hemiparesis: fit for orthotics to prevent contractures (resting hand splint for day, wrist cock up splint at night, PRAFO, etc) Motor recovery: Fluoxetine  1. Does the need for close, 24  hr/day medical supervision in concert with the patient's rehab needs make it unreasonable for this patient to be served in a less intensive setting? Yes  2. Co-Morbidities requiring supervision/potential complications: DM with peripheral neuropathy -currently labile as well (Monitor in accordance with exercise and adjust meds as necessary) ,  HTN is currently labile pressures (monitor and provide prns in accordance with increased physical exertion and pain), hypercholesterolemia (cont meds), COPD (continue to monitor RR and Sats with increased physical activity), previous stroke with ? Right-sided residual weakness, CAD with stenting (continue meds), systolic congestive heart failure (Monitor in accordance with increased physical activity and avoid UE resistance excercises), AKI on CKD (avoid nephrotoxic meds) 3. Due to bladder management, safety, disease management, medication administration and patient education, does the patient require 24 hr/day rehab nursing? Yes 4. Does the patient require coordinated care of a physician, rehab nurse, PT (1-2 hrs/day, 5 days/week), OT (1-2 hrs/day, 5 days/week) and SLP (1-2 hrs/day, 5 days/week) to address physical and functional deficits in the context of the above medical diagnosis(es)? Yes Addressing deficits in the following areas: balance, endurance, locomotion, strength, transferring, bowel/bladder control, bathing, dressing, feeding, grooming, toileting, cognition, speech, language, swallowing and psychosocial support 5. Can the patient actively participate in an intensive therapy program of at least 3 hrs of therapy per day at least 5 days per week? Not at present 6. The potential for patient hemipato make measurable gains while on inpatient rehab is excellent 7. Anticipated functional outcomes upon discharge from inpatient rehab are min assist  with PT, min assist with OT, supervision and min assist with SLP. 8. Estimated rehab length of stay to reach the above  functional goals is: 20-24 days. 9. Does the patient have adequate social supports and living environment to accommodate these discharge functional goals? Yes 10. Anticipated D/C setting: Home 11. Anticipated post D/C treatments: HH therapy and Home excercise program 12. Overall Rehab/Functional Prognosis: good  RECOMMENDATIONS: This patient's condition is appropriate for continued rehabilitative care in the following setting: Likely CIR, however patient is currently unable to tolerate 3 hours therapy a day and acute medical workup is ongoing for increase lethargy and somnolence. Will continue to follow Patient has agreed to participate in recommended program. Potentially Note that insurance prior authorization may be required for reimbursement for recommended care.  Comment: Rehab Admissions Coordinator to follow up  Delice Lesch, MD 01/16/2015

## 2015-01-15 NOTE — Evaluation (Signed)
SLP Cancellation Note  Patient Details Name: Michelle Holmes MRN: [id number] DOB: 03/01/1950   Cancelled treatment:       Reason Eval/Treat Not Completed: Fatigue/lethargy limiting ability to participate   Luanna Salk, [place] [facility] SLP [phone]

## 2015-01-15 NOTE — Progress Notes (Signed)
PT Cancellation Note  Patient Details Name: ESTEFANNY RYNKIEWICZ MRN: [id number] DOB: 1950-05-13   Cancelled Treatment:    Reason Eval/Treat Not Completed: Medical issues which prohibited therapy   Noted incr NIH score overnight with MRI now pending. Will await results prior to proceeding with activity.   Jabe Jeanbaptiste 01/15/2015, 8:53 AM  Pager [phone]

## 2015-01-15 NOTE — Evaluation (Signed)
Physical Therapy Evaluation Patient Details Name: Michelle Holmes MRN: [id number] DOB: 1950/02/25 Today's Date: 01/15/2015   History of Present Illness  64 y.o. female admitted for Rt sided weakness; MRI + Lt thalamus and internal capsule infarcts (?Rt basal ganglia). NIH progressed from 7 on admission to 13. Head CT showed no acute changes  PMHx-CVA, DM, CHF, CAD, colon Ca, emphysema    Clinical Impression  Pt admitted with above diagnosis. Patient with h/o prior CVA, however due to decr communication, unable to obtain history re: her prior level of function. Pt currently with functional limitations due to the deficits listed below (see PT Problem List).  Pt will benefit from skilled PT to increase their independence and safety with mobility to allow discharge to the venue listed below.       Follow Up Recommendations CIR    Equipment Recommendations  Other (comment) (TBD)    Recommendations for Other Services Rehab consult     Precautions / Restrictions Precautions Precautions: Fall Restrictions Weight Bearing Restrictions: No      Mobility  Bed Mobility Overal bed mobility: Needs Assistance Bed Mobility: Rolling;Sidelying to Sit Rolling: Mod assist Sidelying to sit: Max assist;HOB elevated       General bed mobility comments: HOB elevated, use of bedrail. Rolled to R side with mod assist to trunk. Max assist to progress legs off bed and to support trunk to come to sitting position.  Transfers Overall transfer level: Needs assistance Equipment used: None Transfers: Sit to/from Stand Sit to Stand: Max assist         General transfer comment: unable to stand from recliner using armrest on Lt with one person assist. (?effort by pt as she did not want to get out of recliner)  Ambulation/Gait                Stairs            Wheelchair Mobility    Modified Rankin (Stroke Patients Only) Modified Rankin (Stroke Patients Only) Pre-Morbid Rankin Score:   (unknown PLOF) Modified Rankin: Severe disability     Balance Overall balance assessment: Needs assistance Sitting-balance support: Single extremity supported Sitting balance-Leahy Scale: Poor Sitting balance - Comments: at edge of chair Postural control: Posterior lean (may be due to body habitus) Standing balance support: Single extremity supported;During functional activity Standing balance-Leahy Scale: Zero Standing balance comment: Requires LUE support and max assist +2 to maintain standing balance                             Pertinent Vitals/Pain Pain Assessment: No/denies pain    Home Living Family/patient expects to be discharged to:: Private residence Living Arrangements: Other relatives (Godson and Film/video editor) Available Help at Discharge: Family;Available 24 hours/day Type of Home: House Home Access: Level entry     Home Layout: One level Home Equipment: Cane - single point Additional Comments: Unsure of accuracy of pt's responses. Pt became angry and refused to answer any more questions.    Prior Function           Comments: Unsure of PLOF. No family present.      Hand Dominance   Dominant Hand: Right    Extremity/Trunk Assessment   Upper Extremity Assessment: Defer to OT evaluation RUE Deficits / Details: Flaccid UE, 1/5 biceps   RUE Sensation: decreased light touch;decreased proprioception     Lower Extremity Assessment: RLE deficits/detail RLE Deficits / Details: knee extension 3-, ankle  DF 2+    Cervical / Trunk Assessment: Kyphotic  Communication   Communication: Expressive difficulties  Cognition Arousal/Alertness: Awake/alert Behavior During Therapy: Flat affect Overall Cognitive Status: Difficult to assess Area of Impairment: Attention;Memory;Awareness;Following commands;Safety/judgement;Problem solving;Orientation Orientation Level: Situation Current Attention Level: Sustained Memory: Decreased recall of  precautions;Decreased short-term memory Following Commands: Follows one step commands inconsistently;Follows one step commands with increased time Safety/Judgement: Decreased awareness of safety;Decreased awareness of deficits Awareness: Intellectual Problem Solving: Slow processing;Decreased initiation;Difficulty sequencing;Requires verbal cues;Requires tactile cues General Comments: Unsure fo cognitive baseline. Pt became agitated intermittently throughout session with no obvious cause.     General Comments      Exercises        Assessment/Plan    PT Assessment Patient needs continued PT services  PT Diagnosis Hemiplegia dominant side   PT Problem List Decreased strength;Decreased activity tolerance;Decreased balance;Decreased mobility;Decreased safety awareness;Impaired sensation;Impaired tone;Obesity  PT Treatment Interventions DME instruction;Gait training;Functional mobility training;Therapeutic activities;Balance training;Neuromuscular re-education;Cognitive remediation;Patient/family education   PT Goals (Current goals can be found in the Care Plan section) Acute Rehab PT Goals Patient Stated Goal: none stated PT Goal Formulation: Patient unable to participate in goal setting Time For Goal Achievement: 01/29/15 Potential to Achieve Goals: Good    Frequency Min 4X/week   Barriers to discharge        Co-evaluation               End of Session   Activity Tolerance: Patient limited by fatigue (yawning, frequent eye closing) Patient left: in chair;with call bell/phone within reach;with chair alarm set           Time: 1349-1403 PT Time Calculation (min) (ACUTE ONLY): 14 min   Charges:   PT Evaluation $Initial PT Evaluation Tier I: 1 Procedure     PT G Codes:        Remedy Corporan 2015-01-16, 2:19 PM Pager [phone]

## 2015-01-16 ENCOUNTER — Inpatient Hospital Stay (HOSPITAL_COMMUNITY): Payer: Medicare Other

## 2015-01-16 ENCOUNTER — Inpatient Hospital Stay (HOSPITAL_COMMUNITY)
Admit: 2015-01-16 | Discharge: 2015-01-16 | Disposition: A | Discharge status: Home / Self Care | Payer: Medicare Other | Attending: Nurse Practitioner | Admitting: Nurse Practitioner

## 2015-01-16 ENCOUNTER — Other Ambulatory Visit (HOSPITAL_COMMUNITY): Payer: Medicare Other

## 2015-01-16 DIAGNOSIS — I25119 Atherosclerotic heart disease of native coronary artery with unspecified angina pectoris: Secondary | ICD-10-CM

## 2015-01-16 DIAGNOSIS — J449 Chronic obstructive pulmonary disease, unspecified: Secondary | ICD-10-CM

## 2015-01-16 DIAGNOSIS — R0989 Other specified symptoms and signs involving the circulatory and respiratory systems: Secondary | ICD-10-CM | POA: Insufficient documentation

## 2015-01-16 DIAGNOSIS — E1142 Type 2 diabetes mellitus with diabetic polyneuropathy: Secondary | ICD-10-CM

## 2015-01-16 DIAGNOSIS — N179 Acute kidney failure, unspecified: Secondary | ICD-10-CM

## 2015-01-16 DIAGNOSIS — G8191 Hemiplegia, unspecified affecting right dominant side: Secondary | ICD-10-CM

## 2015-01-16 DIAGNOSIS — I6930 Unspecified sequelae of cerebral infarction: Secondary | ICD-10-CM | POA: Insufficient documentation

## 2015-01-16 DIAGNOSIS — I639 Cerebral infarction, unspecified: Principal | ICD-10-CM

## 2015-01-16 DIAGNOSIS — R5383 Other fatigue: Secondary | ICD-10-CM | POA: Insufficient documentation

## 2015-01-16 DIAGNOSIS — I5022 Chronic systolic (congestive) heart failure: Secondary | ICD-10-CM

## 2015-01-16 DIAGNOSIS — I69391 Dysphagia following cerebral infarction: Secondary | ICD-10-CM

## 2015-01-16 DIAGNOSIS — R030 Elevated blood-pressure reading, without diagnosis of hypertension: Secondary | ICD-10-CM

## 2015-01-16 DIAGNOSIS — I69359 Hemiplegia and hemiparesis following cerebral infarction affecting unspecified side: Secondary | ICD-10-CM

## 2015-01-16 DIAGNOSIS — N184 Chronic kidney disease, stage 4 (severe): Secondary | ICD-10-CM

## 2015-01-16 DIAGNOSIS — E1100 Type 2 diabetes mellitus with hyperosmolarity without nonketotic hyperglycemic-hyperosmolar coma (NKHHC): Secondary | ICD-10-CM

## 2015-01-16 DIAGNOSIS — R471 Dysarthria and anarthria: Secondary | ICD-10-CM | POA: Insufficient documentation

## 2015-01-16 DIAGNOSIS — I69320 Aphasia following cerebral infarction: Secondary | ICD-10-CM

## 2015-01-16 DIAGNOSIS — N189 Chronic kidney disease, unspecified: Secondary | ICD-10-CM

## 2015-01-16 DIAGNOSIS — E785 Hyperlipidemia, unspecified: Secondary | ICD-10-CM

## 2015-01-16 DIAGNOSIS — N183 Chronic kidney disease, stage 3 (moderate): Secondary | ICD-10-CM

## 2015-01-16 DIAGNOSIS — M6289 Other specified disorders of muscle: Secondary | ICD-10-CM

## 2015-01-16 LAB — HEMOGLOBIN A1C
Hgb A1c MFr Bld: 13.9 % — ABNORMAL HIGH (ref 4.8–5.6)
Mean Plasma Glucose: 352 mg/dL

## 2015-01-16 LAB — BLOOD GAS, ARTERIAL
Acid-base deficit: 7.9 mmol/L — ABNORMAL HIGH (ref 0.0–2.0)
Bicarbonate: 17.6 mEq/L — ABNORMAL LOW (ref 20.0–24.0)
Drawn by: 313061
O2 Content: 2 L/min
O2 Saturation: 96.6 %
Patient temperature: 98.6
TCO2: 18.8 mmol/L (ref 0–100)
pCO2 arterial: 38.9 mmHg (ref 35.0–45.0)
pH, Arterial: 7.278 — ABNORMAL LOW (ref 7.350–7.450)
pO2, Arterial: 95.6 mmHg (ref 80.0–100.0)

## 2015-01-16 LAB — BASIC METABOLIC PANEL WITH GFR
Anion gap: 9 (ref 5–15)
BUN: 69 mg/dL — ABNORMAL HIGH (ref 6–20)
CO2: 17 mmol/L — ABNORMAL LOW (ref 22–32)
Calcium: 9.1 mg/dL (ref 8.9–10.3)
Chloride: 116 mmol/L — ABNORMAL HIGH (ref 101–111)
Creatinine, Ser: 3.12 mg/dL — ABNORMAL HIGH (ref 0.44–1.00)
GFR calc Af Amer: 17 mL/min — ABNORMAL LOW (ref >60)
GFR calc non Af Amer: 15 mL/min — ABNORMAL LOW (ref >60)
Glucose, Bld: 70 mg/dL (ref 65–99)
Potassium: 4.8 mmol/L (ref 3.5–5.1)
Sodium: 142 mmol/L (ref 135–145)

## 2015-01-16 LAB — GLUCOSE, CAPILLARY
Glucose-Capillary: 186 mg/dL — ABNORMAL HIGH (ref 65–99)
Glucose-Capillary: 67 mg/dL (ref 65–99)
Glucose-Capillary: 76 mg/dL (ref 65–99)
Glucose-Capillary: 107 mg/dL — ABNORMAL HIGH (ref 65–99)
Glucose-Capillary: 65 mg/dL (ref 65–99)
Glucose-Capillary: 96 mg/dL (ref 65–99)
Glucose-Capillary: 77 mg/dL (ref 65–99)
Glucose-Capillary: 106 mg/dL — ABNORMAL HIGH (ref 65–99)
Glucose-Capillary: 112 mg/dL — ABNORMAL HIGH (ref 65–99)

## 2015-01-16 LAB — CBC
HCT: 33.1 % — ABNORMAL LOW (ref 36.0–46.0)
Hemoglobin: 11 g/dL — ABNORMAL LOW (ref 12.0–15.0)
MCH: 31.6 pg (ref 26.0–34.0)
MCHC: 33.2 g/dL (ref 30.0–36.0)
MCV: 95.1 fL (ref 78.0–100.0)
Platelets: 179 10*3/uL (ref 150–400)
RBC: 3.48 MIL/uL — ABNORMAL LOW (ref 3.87–5.11)
RDW: 15.9 % — ABNORMAL HIGH (ref 11.5–15.5)
WBC: 9.9 10*3/uL (ref 4.0–10.5)

## 2015-01-16 MED ORDER — INSULIN GLARGINE 100 UNIT/ML ~~LOC~~ SOLN
45.0000 [IU] | Freq: Two times a day (BID) | SUBCUTANEOUS | Status: DC
  Filled 2015-01-16 (×2): qty 0.45

## 2015-01-16 MED ORDER — BISACODYL 10 MG RE SUPP
10.0000 mg | Freq: Once | RECTAL | Status: DC
  Filled 2015-01-16 (×2): qty 1

## 2015-01-16 MED ORDER — DEXTROSE-NACL 5-0.45 % IV SOLN
INTRAVENOUS | Status: DC
  Administered 2015-01-16: 11:00:00 via INTRAVENOUS

## 2015-01-16 MED ORDER — SODIUM CHLORIDE 0.9 % IV BOLUS (SEPSIS)
500.0000 mL | Freq: Once | INTRAVENOUS | Status: AC
  Administered 2015-01-16: 500 mL via INTRAVENOUS

## 2015-01-16 MED ORDER — DEXTROSE 50 % IV SOLN
INTRAVENOUS | Status: AC
  Administered 2015-01-16: 11:00:00
  Filled 2015-01-16: qty 50

## 2015-01-16 MED ORDER — SODIUM CHLORIDE 0.9 % IV SOLN: INTRAVENOUS | Status: DC

## 2015-01-16 MED ORDER — ONDANSETRON HCL 4 MG/2ML IJ SOLN
4.0000 mg | Freq: Four times a day (QID) | INTRAMUSCULAR | Status: DC | PRN
  Administered 2015-01-16 (×2): 4 mg via INTRAVENOUS
  Filled 2015-01-16 (×2): qty 2

## 2015-01-16 MED ORDER — DEXTROSE 50 % IV SOLN
INTRAVENOUS | Status: AC
  Administered 2015-01-16: 25 mL
  Filled 2015-01-16: qty 50

## 2015-01-16 NOTE — Progress Notes (Signed)
Inpatient Rehabilitation  Medical team recommending IP Rehab admission.  Met with patient at bedside and discussed potential IP Rehab admission with daughter [name] via phone.  Daughter and patient agreeable to seeking insurance approval.  Will continue to follow for timing of medical readiness, tolerance, insurance approval and bed availability.  Please call with questions.  Carmelia Roller., CCC/SLP Admission Coordinator  [facility]  Cell [phone]

## 2015-01-16 NOTE — Progress Notes (Signed)
Patient attempted to be transported to EEG. Patient states "I'm not going." Patient had previously agreed to EEG. RN spoke with patient regarding plan of care, importance of obtaining EEG. Patient again stated "I'm not going." RN asked patient if she would get EEG if test was done in room. Patient agreed. RN notified EEG. EEG tech to collect EEG in late afternoon.

## 2015-01-16 NOTE — Progress Notes (Signed)
Hypoglycemic Event  CBG: 67  Treatment: tried to give pt 4oz of OJ; patient vomitted everywhere; gave 85ml of Dextrose  Symptoms: none; pt asleep  Follow-up CBG: Time:07:07 CBG Result:186  Possible Reasons for Event: unknown  Comments/MD notified: yes via amnion    Michelle Holmes L

## 2015-01-16 NOTE — Progress Notes (Signed)
STROKE TEAM PROGRESS NOTE   HISTORY Michelle Holmes is an 64 y.o. female with history of diabetes mellitus, hypertension, hypercholesterolemia, COPD, previous stroke, and coronary artery disease, presenting with exacerbation of weakness involving her right side starting at 10:30 PM last night. Patient has also had abated blood sugar, including a level of 504 in the emergency room today. His been taking aspirin and Plavix daily. CT scan of her head acute changes. Frontal encephalomalacia from previous stroke was noted. NIH stroke score was 7.  LSN: 10:30 PM on 01/12/2015 tPA Given: No: Beyond time under for treatment consideration mRankin:   SUBJECTIVE (INTERVAL HISTORY) Patient had worsening of right hemiplegia   this morning again with depressed sensorium. Stat CT scan of the head showed no acute abnormality. She seems to be improving somewhat now.  Vitals signs have been stable except for bradycardia OBJECTIVE Temp:  [97.7 F (36.5 C)-99.3 F (37.4 C)] 98 F (36.7 C) (12/21 1307) Pulse Rate:  [44-56] 44 (12/21 1307) Cardiac Rhythm:  [-] Normal sinus rhythm (12/21 0804) Resp:  [16-20] 16 (12/21 1307) BP: (92-120)/(47-66) 114/47 mmHg (12/21 1307) SpO2:  [92 %-100 %] 100 % (12/21 1307)  CBC:   Recent Labs Lab 01/13/15 2133 01/16/15 0644  WBC 7.4 9.9  NEUTROABS 4.5  --   HGB 10.2* 11.0*  HCT 30.3* 33.1*  MCV 93.2 95.1  PLT 167 0000000    Basic Metabolic Panel:   Recent Labs Lab 01/13/15 2133 01/16/15 0644  NA 135 142  K 4.3 4.8  CL 109 116*  CO2 17* 17*  GLUCOSE 258* 70  BUN 61* 69*  CREATININE 2.69* 3.12*  CALCIUM 9.3 9.1    Lipid Panel:     Component Value Date/Time   CHOL 224* 01/15/2015 0534   TRIG 579* 01/15/2015 0534   HDL 22* 01/15/2015 0534   CHOLHDL 10.2 01/15/2015 0534   VLDL UNABLE TO CALCULATE IF TRIGLYCERIDE OVER 400 mg/dL 01/15/2015 0534   LDLCALC UNABLE TO CALCULATE IF TRIGLYCERIDE OVER 400 mg/dL 01/15/2015 0534   HgbA1c:  Lab Results  Component  Value Date   HGBA1C 13.9* 01/15/2015   Urine Drug Screen:     Component Value Date/Time   LABOPIA NONE DETECTED 01/13/2015 2238   COCAINSCRNUR NONE DETECTED 01/13/2015 2238   LABBENZ NONE DETECTED 01/13/2015 2238   AMPHETMU NONE DETECTED 01/13/2015 2238   THCU NONE DETECTED 01/13/2015 2238   LABBARB NONE DETECTED 01/13/2015 2238      IMAGING  Dg Chest 2 View 01/14/2015   Mild interstitial prominence of both lungs is partially due to borderline hypoinflation,but low-grade interstitial edema or other interstitial process may be present.  Ct Head Wo Contrast 01/13/2015   1. No acute intracranial abnormalities.  2. Left frontal lobe encephalomalacia compatible with chronic infarct. New from previous exam.  3. Small vessel ischemic change, similar to previous exam.    Mr Brain Ltd W/o Cm  01/14/2015   1. Limited study with only diffusion and sagittal T1 weighted sequences performed.  2. 11 x 9 mm acute ischemic infarct centered at the inferior left thalamus/left cerebral peduncle, with adjacent 11 mm curvilinear infarct within the posterior limb of the left internal capsule/mesial left temporal lobe. No associated mass effect.  3. Additional punctate foci of diffusion abnormality within the right basal ganglia as above, also suspicious for tiny acute ischemic infarcts.  4. No other definite acute intracranial process on this limited study.        PHYSICAL EXAM Pleasant middle-aged lady not  in distress. . Afebrile. Head is nontraumatic. Neck is supple without bruit.    Cardiac exam no murmur or gallop. Lungs are clear to auscultation. Distal pulses are well felt. Neurological Exam :  drowsy but can be aroused Mild dysarthria but no aphasia. Follows commands well. Extraocular moments are full range without nystagmus. Fundi were not visualized. Vision acuity seems adequate. Mild right lower facial weakness. Tongue midline. Motor system exam revealed moderate right hemiplegia 3/5  with weakness of right grip and intrinsic hand muscles. Slight right upper extremity drift. Mild weakness of right hip flexors and ankle dorsiflexors. Tone is increased on the right side. Deep tendon reflexes are brisker on the right compared to the left. Right plantar is upgoing left is downgoing. Sensation is diminished in the right hemibody. Gait was not tested. Coordination is slightly impaired on the right compared to the left. ASSESSMENT/PLAN Michelle Holmes is a 64 y.o. female with history of diabetes mellitus, hypertension, hypercholesterolemia, COPD, previous stroke, and coronary artery disease presenting with exacerbation of weakness involving her right side . She did not receive IV t-PA due to late presentation.  Stroke:  Dominant infarcts possibly   from small vessel disease  Resultant  right hemiplegia  MRI  as noted above  MRA  not performed  TCD's - pending  Carotid Doppler - No significant plaque or stenosis at either carotid bifurcations. Bilateral vertebral artery flow is  antegrade.  2D Echo  EF 55-60%. No cardiac source of emboli identified.  LDL unable to calculate secondary to triglycerides of 712  HgbA1c pending  VTE prophylaxis - not Diet NPO time specified  aspirin 81 mg daily and clopidogrel 75 mg daily prior to admission, now on aspirin 81 mg daily and clopidogrel 75 mg daily  Patient counseled to be compliant with her antithrombotic medications  Ongoing aggressive stroke risk factor management  Therapy recommendations: CLR  Disposition: Pending  Hypertension  Stable Permissive hypertension (OK if < 220/120) but gradually normalize in 5-7 days  Hyperlipidemia  Home meds:  Lipitor 40 mg daily resumed in hospital. Also on Lopid 600 mg twice a day prior to admission.  LDL could not be calculated, goal < 70  Continue Lipitor 40 mg daily since patient is also on Lopid - confirm compliance with medications.  Continue statin at  discharge  Diabetes  HgbA1c pending, goal < 7.0  Uncontrolled  Other Stroke Risk Factors  Advanced age  Cigarette smoker, quit smoking   Obesity, Body mass index is 37.86 kg/(m^2).   Hx stroke/TIA  Coronary artery disease   Other Active Problems  Anemia  Renal insufficiency   Hospital day # 3     I have personally examined this patient, reviewed notes, independently viewed imaging studies, participated in medical decision making and plan of care. I have made any additions or clarifications directly to the above note. Agree with note above. She presented with worsening of previous right-sided weakness secondary to new left brain subcortical infarct etiology likely small vessel disease but seems to be refusing. Recommend discussion with family members to try to convince her. Patient has again had neurological worsening with depressed level of sensorium. Her blood sugar is 70 this morning and this may be related to hyperglycemia but stopped subclinical seizures is also another possibility. Check EEG. Patient again refuses MRI today. Discussed with Dr. Dorothy Puffer, MD Medical Director [place] Pager: [phone] 01/16/2015 3:30 PM     To contact Stroke Continuity provider,  please refer to [email or web address] After hours, contact General Neurology

## 2015-01-16 NOTE — Progress Notes (Signed)
Patient became more lethargic, pale. 500cc NS bolus was administered, D5- .45 NS fluids started. Patient lying flat, with 2L [place]. VSS. Continuous O2 on patient, saturations 99% on 2L, respirations symmetrical, unlabored, RR at 20. Patient is arousable but falls to sleep as soon as pain or voice stops. Respiratory aware of order for ABG. Stat CT ordered by stroke team. RN and tech to transport patient down. Will continue to monitor.

## 2015-01-16 NOTE — Progress Notes (Signed)
PT Cancellation Note  Patient Details Name: Michelle Holmes MRN: [id number] DOB: [birth year]   Cancelled Treatment:    Reason Eval/Treat Not Completed: Medical issues which prohibited therapy. Noted order for HOB flat. Discussed with RN and she reports pt is lethargic and unable to participate with therapy at this time.   Jari Dipasquale 01/16/2015, 10:22 AM  Pager [phone]

## 2015-01-16 NOTE — Evaluation (Signed)
Clinical/Bedside Swallow Evaluation Patient Details  Name: Michelle Holmes MRN: [id number] Date of Birth: 29-Jan-1950  Today's Date: 01/16/2015 Time: SLP Start Time (ACUTE ONLY): [id number] SLP Stop Time (ACUTE ONLY): 1549 SLP Time Calculation (min) (ACUTE ONLY): 16 min  Past Medical History:  Past Medical History  Diagnosis Date  . Emphysema   . Colon cancer ([place])   . Hypertension   . Hypercholesteremia   . Diabetes mellitus   . COPD (chronic obstructive pulmonary disease) ([place])   . Coronary artery disease   . Obesity   . Sleep apnea   . TIA (transient ischemic attack)   . CHF (congestive heart failure) ([place])   . Anemia   . DJD (degenerative joint disease)   . Renal insufficiency   . Chronic back pain   . Scoliosis   . Obesity hypoventilation syndrome ([place])   . Stroke ([place])   . Myocardial infarction [facility])    Past Surgical History:  Past Surgical History  Procedure Laterality Date  . Coronary angioplasty with stent placement    . Colon surgery     HPI:  64 yo female adm to [facility] with right sided weakness,  Pt found to have left thalamic CVA.  PMH + for emphysema, prior CVA, disabled.  Speech evaluation ordered.  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 Pt initially passed RN stroke swallow screen, but on 12/21 thjere was concern for acute neurological change, therefore pt was made NPO and SLP swallow evaluation was ordered.   Assessment / Plan / Recommendation Clinical Impression  Pt has right-sided labial weakness that leads to reduced oral containment, anterior loss of thin liquids, and suspected premature spillage into the pharynx. Immediate coughing noted with thin liquids, but not with Min cues for small cup sips. She has prolonged mastication which may be close to baseline, but right-sided pocketing and lingual residue are also observed. Recommend Dys 2 diet and thin liquids. When SLP told pt about recommendation to not use straws she was  initially upset - offered to try a straw with thickened liquids if her preference was to not drink from the cup, but pt declined further trials. Will continue to follow for tolerance and advancement as able.    Aspiration Risk  Mild aspiration risk    Diet Recommendation  Dys 2 diet, thin liquids No straws   Medication Administration: Whole meds with puree    Other  Recommendations Oral Care Recommendations: Oral care BID   Follow up Recommendations  Inpatient Rehab    Frequency and Duration min 2x/week  2 weeks       Prognosis Prognosis for Safe Diet Advancement: Good Barriers to Reach Goals: Cognitive deficits      Swallow Study   General HPI: 64 yo female adm to [facility] with right sided weakness,  Pt found to have left thalamic CVA.  PMH + for emphysema, prior CVA, disabled.  Speech evaluation ordered.  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 Pt initially passed RN stroke swallow screen, but on 12/21 thjere was concern for acute neurological change, therefore pt was made NPO and SLP swallow evaluation was ordered. Type of Study: Bedside Swallow Evaluation Previous Swallow Assessment: BSE in 01/2012 recommending regular diet and thin liquids Diet Prior to this Study: NPO Temperature Spikes Noted: No Respiratory Status: Room air History of Recent Intubation: No Behavior/Cognition: Alert;Cooperative;Requires cueing Oral Cavity Assessment: Within Functional Limits Oral Care Completed by SLP: No Oral Cavity - Dentition:  Edentulous Vision: Functional for self-feeding Self-Feeding Abilities: Able to feed self Patient Positioning: Upright in bed Baseline Vocal Quality: Normal Volitional Cough: Strong Volitional Swallow: Unable to elicit    Oral/Motor/Sensory Function Overall Oral Motor/Sensory Function: Moderate impairment Facial ROM: Reduced right;Suspected CN VII (facial) dysfunction Facial Symmetry: Abnormal symmetry right;Suspected CN VII  (facial) dysfunction Facial Strength: Reduced right;Suspected CN VII (facial) dysfunction Facial Sensation: Reduced right;Suspected CN V (Trigeminal) dysfunction Lingual ROM: Reduced right;Suspected CN XII (hypoglossal) dysfunction Lingual Symmetry: Abnormal symmetry right Lingual Strength: Suspected CN XII (hypoglossal) dysfunction Lingual Sensation: Suspected CN VII (facial) dysfunction-anterior 2/3 tongue   Ice Chips Ice chips: Not tested   Thin Liquid Thin Liquid: Impaired Presentation: Cup;Self Fed;Straw Oral Phase Impairments: Reduced labial seal;Poor awareness of bolus Oral Phase Functional Implications: Right anterior spillage;Oral holding Pharyngeal  Phase Impairments: Suspected delayed Swallow;Cough - Immediate    Nectar Thick Nectar Thick Liquid: Not tested   Honey Thick Honey Thick Liquid: Not tested   Puree Puree: Within functional limits Presentation: Self Fed;Spoon   Solid Solid: Impaired Presentation: Self Fed Oral Phase Impairments: Impaired mastication;Reduced lingual movement/coordination Oral Phase Functional Implications: Right lateral sulci pocketing      Germain Osgood, M.A. CCC-SLP [phone]  Germain Osgood 01/16/2015,4:01 PM

## 2015-01-16 NOTE — Procedures (Addendum)
History: 64 yo  F with right sided weakness   Sedation: none  Technique: This is a 21 channel routine scalp EEG performed at the bedside with bipolar and monopolar montages arranged in accordance to the international 10/20 system of electrode placement. One channel was dedicated to EKG recording.    Background: The background consists of intermixed alpha and beta activities. There is a well defined posterior dominant rhythm of 9 Hz that attenuates with eye opening. She has some centrally predominant theta(cig[place]k) which is a normal variant.  Photic stimulation: Physiologic driving is now perform  EEG Abnormalities: None  Clinical Interpretation: This normal EEG is recorded in the waking and drowsy state. There was no seizure or seizure predisposition recorded on this study. Please note that a normal EEG does not preclude the possibility of epilepsy.   Roland Rack, MD [facility] [phone]  If 7pm- 7am, please page neurology on call as listed in [place].

## 2015-01-16 NOTE — Progress Notes (Signed)
EEG Completed; Results Pending  

## 2015-01-16 NOTE — Progress Notes (Signed)
Patient less alert than AM assessment. Patient arousable but falls back asleep after stimulation is stopped. NIHHS unchanged. VSS, CBG 76. MD notified. Fluids ordered, XR tech in room to obtain CT abdomen. Per MD, obtain ABG via respiratory, will continue to monitor closely.

## 2015-01-16 NOTE — Progress Notes (Addendum)
PROGRESS NOTE    Michelle Holmes [id number] DOB: 05-09-1950 DOA: 01/13/2015 PCP: Charolette Forward, MD  HPI/Brief narrative 64 year old female with history of COPD, OSA/OHS, chronic respiratory failure on home oxygen 2 L/m, HTN, HLD, chronic diastolic CHF, CAD, stage IV CKD, DM 2, colon cancer and CVA, presented to the ED with right-sided weakness that began at 10:30 PM on 01/12/15. She also had elevated blood sugar of 504 in the emergency department. Admitted for stroke evaluation. Neurology consulted.   Assessment/Plan:  Acute left brain stroke - resultant R hemiplegia - Etiology: Possibly small vessel disease - MRI brain: Limited study. 11 x 9 cm acute ischemic infarct centered at the inferior left thalamus/left cerebral peduncle, with adjacent 11 mm curvilinear infarct within the posterior limb of the left internal capsule/Mecial left temporal lobe & additional punctate foci of diffusion abnormality within the right basal ganglia also suspicious for tiny acute ischemic infarcts - MRA brain: Patient has been consistently refusing, tried again today, still refusing. - TCD pending - Carotid Dopplers: No plaque or stenosis seen in ICAs bilaterally. Vertebral artery flow is antegrade - 2-D echo: LVEF 55-60 percent and grade 1 diastolic dysfunction. - [id number]: 13.9, 7 month ago was 11.9.  - Lipid panel: Unable to calculate LDL secondary to very high triglycerides. - Patient was on aspirin 81 MG + Plavix 75 MG PTA-continue same. - Neurology consultation appreciated. - Therapies: Recommend CIR - Patient with an episode of lethargy this a.m. ABG was no CO2 retention, EEG pending, will discontinue Ativan and pain medication( did not receive any since admission), repeat CT head showing Expected evolution of the inferior left thalamic lacunar infarct with no hemorrhage or mass effect  Uncontrolled DM 2/IDDM - Claims compliance with Lantus but not to her diet. - Hemoglobin A1c 13.9. - Patient with  an episode of hypoglycemia this a.m.,(CBG of 65, which is considered low for her giving her A1c of 13.9) Lantus dose has been stopped this a.m. giving hypoglycemia, especially not able to tolerate by mouth intake giving her mental status, started on D5 half-normal saline to prevent further hypoglycemia.   Vomiting - No acute finding on abdominal x-ray, Patient  with no BM since admission, will start on laxatives.  Essential hypertension - Allow for permissive hypertension. Will discontinue metoprolol during soft blood pressure, and bradycardia, we'll not start Cozaar giving her renal failure.  COPD, OSA/OHS, chronic respiratory failure with hypoxia - Stable. Continue oxygen. Former smoker.  Morbid obesity/Body mass index is 37.86 kg/(m^2).  Chronic diastolic CHF - Compensated  Hyperlipidemia/hypertriglyceridemia - LDL goal <70. Continue atorvastatin.Unable to calculate LDL secondary to very high triglycerides.? Compliance. Change gemfibrozil to fenofibrate to decrease risk of rhabdo, as per pharmacy recommendation.  Anemia - Stable  Acute on chronic kidney disease Stage IV /metabolic acidosis - Baseline creatinine: 2.7. Creatinine at baseline. Creatinine is 3.1 today, will start on IV fluid ,continue to monitor Recommend outpatient nephrology consultation and follow-up.   DVT prophylaxis: Lovenox Code Status: Full Family Communication: None at bedside.  Disposition Plan: Dc when medically stable. Disposition to be determined.   Consultants:  Neurology  Procedures:  2-D echo 01/14/15: Study Conclusions  - Left ventricle: The cavity size was normal. Wall thickness was increased in a pattern of moderate LVH. Systolic function was normal. The estimated ejection fraction was in the range of 55% to 60%. Wall motion was normal; there were no regional wall motion abnormalities. Doppler parameters are consistent with abnormal left ventricular relaxation (grade 1  diastolic  dysfunction). - Atrial septum: No defect or patent foramen ovale was identified.  Carotid Dopplers: Carotid duplex completed. No plaque or stenosis seen in ICA's bilaterally. Vertebral artery flow is antegrade.   Antibiotics:  None   Subjective: Patient with episode of vomiting this a.m., as well more lethargic than baseline.  Objective: Filed Vitals:   01/16/15 0804 01/16/15 0924 01/16/15 1027 01/16/15 1307  BP: 120/64 101/54 98/49 114/47  Pulse: 53 51 47 44  Temp: 98.8 F (37.1 C) 97.7 F (36.5 C) 97.8 F (36.6 C) 98 F (36.7 C)  TempSrc: Oral Oral Oral Oral  Resp: 18 18 16 16   Height:      Weight:      SpO2: 94% 92% 99% 100%    Intake/Output Summary (Last 24 hours) at 01/16/15 1333 Last data filed at 01/15/15 2240  Gross per 24 hour  Intake    240 ml  Output      0 ml  Net    240 ml   Filed Weights   01/14/15 0046  Weight: 103.193 kg (227 lb 8 oz)     Exam:  General exam: Moderately built and obese pleasant middle-aged female lying comfortably propped up in bed. Respiratory system: Clear. No increased work of breathing. Cardiovascular system: S1 & S2 heard, RRR. No JVD, murmurs, gallops, clicks or pedal edema. Telemetry: SB 50's-SR. Gastrointestinal system: Abdomen is nondistended, soft and nontender. Normal bowel sounds heard. Central nervous system: Alert and oriented. No focal neurological deficits. Extremities: Symmetric 5 x 5 power in left limbs. Significant right-sided weakness.  Data Reviewed: Basic Metabolic Panel:  Recent Labs Lab 01/12/15 1336 01/13/15 2133 01/16/15 0644  NA 131* 135 142  K 5.4* 4.3 4.8  CL 101 109 116*  CO2 19* 17* 17*  GLUCOSE 504* 258* 70  BUN 65* 61* 69*  CREATININE 2.70* 2.69* 3.12*  CALCIUM 9.6 9.3 9.1   Liver Function Tests:  Recent Labs Lab 01/13/15 2133  AST 13*  ALT 15  ALKPHOS 80  BILITOT 0.4  PROT 6.8  ALBUMIN 3.5   No results for input(s): LIPASE, AMYLASE in the last 168  hours. No results for input(s): AMMONIA in the last 168 hours. CBC:  Recent Labs Lab 01/12/15 1336 01/13/15 2133 01/16/15 0644  WBC 6.5 7.4 9.9  NEUTROABS  --  4.5  --   HGB 10.6* 10.2* 11.0*  HCT 30.7* 30.3* 33.1*  MCV 91.9 93.2 95.1  PLT 165 167 179   Cardiac Enzymes: No results for input(s): CKTOTAL, CKMB, CKMBINDEX, TROPONINI in the last 168 hours. BNP (last 3 results) No results for input(s): PROBNP in the last 8760 hours. CBG:  Recent Labs Lab 01/16/15 0930 01/16/15 1044 01/16/15 1101 01/16/15 1127 01/16/15 1146  GLUCAP 76 65 107* 96 77    No results found for this or any previous visit (from the past 240 hour(s)).         Studies: Ct Head Wo Contrast  01/16/2015  CLINICAL DATA:  64 year old female with right side weakness, lethargy and slurred speech. Inferior left thalamic infarct on 01/14/2015 brain MRI. Initial encounter. EXAM: CT HEAD WITHOUT CONTRAST TECHNIQUE: Contiguous axial images were obtained from the base of the skull through the vertex without intravenous contrast. COMPARISON:  Head CT 01/15/2015 and earlier. FINDINGS: Stable paranasal sinuses and mastoids. Stable visualized osseous structures. Stable orbit and scalp soft tissues. Hypodensity corresponding to the left inferior thalamic round area of restricted diffusion on the recent MRI. This does appear to have mildly evolved  since 01/13/2015. No associated hemorrhage or mass effect. Superimposed left basal ganglia heterogeneous hypodensity, patchy bilateral cerebral white matter hypodensity, and anterior left MCA territory cortical encephalomalacia. Superimposed intracranial artery dolichoectasia and Calcified atherosclerosis at the skull base. No intracranial mass effect. No ventriculomegaly. IMPRESSION: 1. Expected evolution of the inferior left thalamic lacunar infarct with no hemorrhage or mass effect. 2. Underlying advanced chronic small and medium-sized vessel ischemia. Electronically Signed    By: Genevie Ann M.D.   On: 01/16/2015 11:46   Ct Head Wo Contrast  01/15/2015  CLINICAL DATA:  Sequela of cerebral infarction EXAM: CT HEAD WITHOUT CONTRAST TECHNIQUE: Contiguous axial images were obtained from the base of the skull through the vertex without intravenous contrast. COMPARISON:  Two days ago FINDINGS: The acute left subthalamic lacunar infarct shows no hemorrhagic conversion or evidence of growth. Stable chronic ischemic injury in the bilateral cerebral white matter, left putamen, and in the anterior left frontal cortex. No new site of infarct is identified. No hydrocephalus, shift, or mass lesion. Diffuse intracranial calcified atherosclerosis. No new orbital or osseous findings. Clear visualized paranasal sinuses and mastoids. IMPRESSION: Stable exam. No hemorrhagic conversion or evidence of infarct extension. Electronically Signed   By: Monte Fantasia M.D.   On: 01/15/2015 03:33   Dg Abd Portable 1v  01/16/2015  CLINICAL DATA:  Constipation and vomiting episodes this morning. EXAM: PORTABLE ABDOMEN - 1 VIEW COMPARISON:  CT 02/10/2014 and abdominal radiographs 06/20/2014 FINDINGS: Again noted is marked dextroscoliosis of the thoracolumbar spine. Few surgical clips scattered throughout the abdomen and pelvis. There is gas within the small and large bowel. Small amount amount of stool in the colon. Multiple pelvic calcifications. IMPRESSION: Nonobstructive bowel gas pattern Electronically Signed   By: Markus Daft M.D.   On: 01/16/2015 09:52        Scheduled Meds: . aspirin EC  81 mg Oral Daily  . atorvastatin  40 mg Oral q1800  . bisacodyl  10 mg Rectal Once  . clopidogrel  75 mg Oral Daily  . enoxaparin (LOVENOX) injection  40 mg Subcutaneous QHS  . fenofibrate  54 mg Oral Daily  . ferrous sulfate  325 mg Oral BID WC  . insulin aspart  0-15 Units Subcutaneous TID WC  . insulin aspart  0-5 Units Subcutaneous QHS  . metoprolol succinate  100 mg Oral Daily  . mometasone-formoterol   2 puff Inhalation BID  . pantoprazole  40 mg Oral Daily  . pregabalin  50 mg Oral BID  . sodium chloride  3 mL Intravenous Q12H  . sodium chloride  3 mL Intravenous Q12H   Continuous Infusions: . dextrose 5 % and 0.45% NaCl 75 mL/hr at 01/16/15 1207    Principal Problem:   CVA (cerebral vascular accident) [facility]) Active Problems:   Uncontrolled type 2 DM with hyperosmolar nonketotic hyperglycemia (HCC)   CHF (congestive heart failure) (HCC)   Chronic renal failure   CAD (coronary artery disease)   Right sided weakness   CVA (cerebral infarction)   Right hemiplegia (HCC)   Acute ischemic stroke (HCC)   AKI (acute kidney injury) ([place])   Dysarthria   Lethargy   DM type 2 with diabetic peripheral neuropathy (HCC)   Labile blood pressure   Hyperlipidemia   History of CVA with residual deficit   Chronic obstructive pulmonary disease (HCC)   Hemiparesis, aphasia, and dysphagia as late effect of cerebrovascular accident (CVA) ([place])    Time spent: 35 minutes.    Phillips Climes, MD,  [facility] Pager [phone]  If 7PM-7AM, please contact night-coverage [email or web address] Password [email or web address] 01/16/2015, 1:33 PM    LOS: 3 days

## 2015-01-16 NOTE — Care Management Important Message (Signed)
Important Message  Patient Details  Name: Michelle Holmes MRN: [id number] Date of Birth: [birth year]   Medicare Important Message Given:  Yes    Carolos Fecher P Vasil Juhasz 01/16/2015, 3:30 PM

## 2015-01-16 NOTE — Progress Notes (Signed)
Patient continued vomiting. Emesis consisted of orange juice and bile. Zofran administered. Patient states she feels relieved, no more nausea. MD notified, per stroke team, patient has had chronic nausea and "not feeling well." Will continue to monitor patient.

## 2015-01-16 NOTE — Progress Notes (Addendum)
Patient refuses MRI. RN explained benefits of test, patient still refused. Attending MD and stroke team notified.   Patient is now back at baseline for neuro assessment. Patient is alert, orientedx3, right arm and leg remain flaccid.

## 2015-01-17 ENCOUNTER — Inpatient Hospital Stay (HOSPITAL_COMMUNITY): Payer: Medicare Other

## 2015-01-17 DIAGNOSIS — I639 Cerebral infarction, unspecified: Secondary | ICD-10-CM

## 2015-01-17 LAB — GLUCOSE, CAPILLARY
Glucose-Capillary: 156 mg/dL — ABNORMAL HIGH (ref 65–99)
Glucose-Capillary: 61 mg/dL — ABNORMAL LOW (ref 65–99)
Glucose-Capillary: 66 mg/dL (ref 65–99)
Glucose-Capillary: 69 mg/dL (ref 65–99)
Glucose-Capillary: 135 mg/dL — ABNORMAL HIGH (ref 65–99)
Glucose-Capillary: 140 mg/dL — ABNORMAL HIGH (ref 65–99)
Glucose-Capillary: 66 mg/dL (ref 65–99)

## 2015-01-17 LAB — BASIC METABOLIC PANEL
Anion gap: 8 (ref 5–15)
BUN: 61 mg/dL — ABNORMAL HIGH (ref 6–20)
CO2: 19 mmol/L — ABNORMAL LOW (ref 22–32)
Calcium: 8.8 mg/dL — ABNORMAL LOW (ref 8.9–10.3)
Chloride: 116 mmol/L — ABNORMAL HIGH (ref 101–111)
Creatinine, Ser: 3.03 mg/dL — ABNORMAL HIGH (ref 0.44–1.00)
GFR calc Af Amer: 18 mL/min — ABNORMAL LOW (ref >60)
GFR calc non Af Amer: 15 mL/min — ABNORMAL LOW (ref >60)
Glucose, Bld: 74 mg/dL (ref 65–99)
Potassium: 4.6 mmol/L (ref 3.5–5.1)
Sodium: 143 mmol/L (ref 135–145)

## 2015-01-17 MED ORDER — DEXTROSE 50 % IV SOLN
INTRAVENOUS | Status: AC
  Administered 2015-01-17: 12.5 mL
  Filled 2015-01-17: qty 50

## 2015-01-17 MED ORDER — ENSURE ENLIVE PO LIQD
237.0000 mL | Freq: Two times a day (BID) | ORAL | Status: DC
Start: 2015-01-17 — End: 2015-01-18
  Administered 2015-01-17 – 2015-01-18 (×4): 237 mL via ORAL
  Filled 2015-01-17 (×6): qty 237

## 2015-01-17 NOTE — Progress Notes (Signed)
Patient Demographics  Michelle Holmes, is a 64 y.o. female, DOB - 03/15/1950, [id number]  Admit date - 01/13/2015   Admitting Physician Reubin Milan, MD  Outpatient Primary MD for the patient is Charolette Forward, MD  LOS - 4   Chief Complaint  Patient presents with  . Extremity Weakness       Admission HPI/Brief narrative: 64 year old female with history of COPD, OSA/OHS, chronic respiratory failure on home oxygen 2 L/m, HTN, HLD, chronic diastolic CHF, CAD, stage IV CKD, DM 2, colon cancer and CVA, presented to the ED with right-sided weakness that began at 10:30 PM on 01/12/15. She also had elevated blood sugar of 504 in the emergency department. Admitted for stroke evaluation. Neurology consulted. Subjective:   Michelle Holmes today has, No headache, No chest pain, No abdominal pain - No Nausea,  No Cough - SOB.  Assessment & Plan    Principal Problem:   CVA (cerebral vascular accident) ([place]) Active Problems:   Uncontrolled type 2 DM with hyperosmolar nonketotic hyperglycemia (HCC)   CHF (congestive heart failure) (HCC)   Chronic renal failure   CAD (coronary artery disease)   Right sided weakness   CVA (cerebral infarction)   Right hemiplegia (HCC)   Acute ischemic stroke (HCC)   AKI (acute kidney injury) ([place])   Dysarthria   Lethargy   DM type 2 with diabetic peripheral neuropathy (HCC)   Labile blood pressure   Hyperlipidemia   History of CVA with residual deficit   Chronic obstructive pulmonary disease (HCC)   Hemiparesis, aphasia, and dysphagia as late effect of cerebrovascular accident (CVA) ([place])  Acute left brain stroke - resultant R hemiplegia - Etiology: Possibly small vessel disease - MRI brain: Limited study. 11 x 9 cm acute ischemic infarct centered at the inferior left thalamus/left cerebral peduncle, with adjacent 11 mm curvilinear infarct within the posterior limb of  the left internal capsule/Mecial left temporal lobe & additional punctate foci of diffusion abnormality within the right basal ganglia also suspicious for tiny acute ischemic infarcts - MRA brain: Patient has been consistently refusing, tried again 12/21 and 12/22, refused again. - TCD pending - Carotid Dopplers: No plaque or stenosis seen in ICAs bilaterally. Vertebral artery flow is antegrade - 2-D echo: LVEF 55-60 percent and grade 1 diastolic dysfunction. - [id number]: 13.9, 7 month ago was 11.9.  - Lipid panel: Unable to calculate LDL secondary to very high triglycerides. - Patient was on aspirin 81 MG + Plavix 75 MG PTA-continue same. - Neurology consultation appreciated. - Therapies: Recommend CIR  Uncontrolled DM 2/IDDM - Claims compliance with Lantus but not to her diet. - Hemoglobin A1c 13.9. - Patient with hypoglycemia this a.m. as well even though Lantus has been held for 24 hours and is on D5 half-normal saline, will discontinue insulin sliding scale and increase IV fluids, patient encouraged to continue oral intake, started on ensure as well.   Vomiting - No recurrence. KUB with no acute findings.  Essential hypertension - Allow for permissive hypertension. Will discontinue metoprolol secondary to soft blood pressure, and bradycardia, we'll not start Cozaar giving her renal failure.  COPD, OSA/OHS, chronic respiratory failure with hypoxia - Stable. Continue oxygen. Former smoker.  Morbid obesity/Body mass index is 37.86 kg/(m^2).  Chronic diastolic CHF - Compensated  Hyperlipidemia/hypertriglyceridemia - LDL goal <70. Continue atorvastatin.Unable to calculate LDL secondary to very high triglycerides.? Compliance. Change gemfibrozil to fenofibrate to decrease risk of rhabdo, as per pharmacy recommendation.  Anemia - Stable  Acute on chronic kidney disease Stage IV /metabolic acidosis - Baseline creatinine: 2.7. Creatinine at baseline. Creatinine is 3.1 today, ,continue to  monitor Recommend outpatient nephrology consultation and follow-up.  Code Status: Full  Family Communication: Daughter is at bedside  Disposition Plan: Pending further workup, possible CIR when medically stable   Procedures  2-D echo 12/19 Carotid Doppler  Consults   Neurology   Medications  Scheduled Meds: . aspirin EC  81 mg Oral Daily  . atorvastatin  40 mg Oral q1800  . bisacodyl  10 mg Rectal Once  . clopidogrel  75 mg Oral Daily  . enoxaparin (LOVENOX) injection  40 mg Subcutaneous QHS  . feeding supplement (ENSURE ENLIVE)  237 mL Oral BID BM  . fenofibrate  54 mg Oral Daily  . ferrous sulfate  325 mg Oral BID WC  . mometasone-formoterol  2 puff Inhalation BID  . pantoprazole  40 mg Oral Daily  . pregabalin  50 mg Oral BID  . sodium chloride  3 mL Intravenous Q12H  . sodium chloride  3 mL Intravenous Q12H   Continuous Infusions: . dextrose 5 % and 0.45% NaCl 125 mL/hr at 01/17/15 0729   PRN Meds:.sodium chloride, ondansetron (ZOFRAN) IV, sodium chloride  DVT Prophylaxis  Lovenox  Lab Results  Component Value Date   PLT 179 01/16/2015    Antibiotics    Anti-infectives    None          Objective:   Filed Vitals:   01/16/15 2137 01/17/15 0101 01/17/15 0530 01/17/15 0920  BP: 129/62 117/67 114/66 120/66  Pulse: 47 55 48 47  Temp: 98.2 F (36.8 C) 98.2 F (36.8 C) 98.2 F (36.8 C) 97.8 F (36.6 C)  TempSrc: Temporal Oral Oral Oral  Resp: 16 20 20 16   Height:      Weight:      SpO2: 96% 99% 100% 100%    Wt Readings from Last 3 Encounters:  01/14/15 103.193 kg (227 lb 8 oz)  01/12/15 102.513 kg (226 lb)  06/20/14 104.781 kg (231 lb)    No intake or output data in the 24 hours ending 01/17/15 1205   Physical Exam  General exam: Moderately built and obese pleasant middle-aged female sitting in chair in no apparent distress. Respiratory system: Clear. No increased work of breathing. Cardiovascular system: S1 & S2 heard, RRR. No JVD,  murmurs, gallops, clicks or pedal edema. Telemetry: SB 50's-SR. Gastrointestinal system: Abdomen is nondistended, soft and nontender. Normal bowel sounds heard. Central nervous system: Alert and oriented. No focal neurological deficits. Extremities: Symmetric 5 x 5 power in left limbs. Significant right-sided weakness.  Data Review   Micro Results No results found for this or any previous visit (from the past 240 hour(s)).  Radiology Reports Dg Chest 2 View  01/14/2015  CLINICAL DATA:  TIA symptoms, no acute chest complaints, history of COPD and CHF and coronary angioplasty. EXAM: CHEST  2 VIEW COMPARISON:  Chest x-ray of Jun 20, 2014 FINDINGS: The lungs are borderline hypoinflated which not a new finding. The cardiac silhouette remains enlarged. The pulmonary interstitial markings are minimally prominent but not significantly changed from the previous study. There is marked reverse S shaped thoracolumbar scoliosis. On the lateral film increased density projects over the  mid to lower thoracic spine but this is stable IMPRESSION: Mild interstitial prominence of both lungs is partially due to borderline hypoinflation,but low-grade interstitial edema or other interstitial process may be present. Electronically Signed   By: David  Martinique M.D.   On: 01/14/2015 07:32   Ct Head Wo Contrast  01/16/2015  CLINICAL DATA:  64 year old female with right side weakness, lethargy and slurred speech. Inferior left thalamic infarct on 01/14/2015 brain MRI. Initial encounter. EXAM: CT HEAD WITHOUT CONTRAST TECHNIQUE: Contiguous axial images were obtained from the base of the skull through the vertex without intravenous contrast. COMPARISON:  Head CT 01/15/2015 and earlier. FINDINGS: Stable paranasal sinuses and mastoids. Stable visualized osseous structures. Stable orbit and scalp soft tissues. Hypodensity corresponding to the left inferior thalamic round area of restricted diffusion on the recent MRI. This does  appear to have mildly evolved since 01/13/2015. No associated hemorrhage or mass effect. Superimposed left basal ganglia heterogeneous hypodensity, patchy bilateral cerebral white matter hypodensity, and anterior left MCA territory cortical encephalomalacia. Superimposed intracranial artery dolichoectasia and Calcified atherosclerosis at the skull base. No intracranial mass effect. No ventriculomegaly. IMPRESSION: 1. Expected evolution of the inferior left thalamic lacunar infarct with no hemorrhage or mass effect. 2. Underlying advanced chronic small and medium-sized vessel ischemia. Electronically Signed   By: Genevie Ann M.D.   On: 01/16/2015 11:46   Ct Head Wo Contrast  01/15/2015  CLINICAL DATA:  Sequela of cerebral infarction EXAM: CT HEAD WITHOUT CONTRAST TECHNIQUE: Contiguous axial images were obtained from the base of the skull through the vertex without intravenous contrast. COMPARISON:  Two days ago FINDINGS: The acute left subthalamic lacunar infarct shows no hemorrhagic conversion or evidence of growth. Stable chronic ischemic injury in the bilateral cerebral white matter, left putamen, and in the anterior left frontal cortex. No new site of infarct is identified. No hydrocephalus, shift, or mass lesion. Diffuse intracranial calcified atherosclerosis. No new orbital or osseous findings. Clear visualized paranasal sinuses and mastoids. IMPRESSION: Stable exam. No hemorrhagic conversion or evidence of infarct extension. Electronically Signed   By: Monte Fantasia M.D.   On: 01/15/2015 03:33   Ct Head Wo Contrast  01/13/2015  CLINICAL DATA:  Right-sided weakness. EXAM: CT HEAD WITHOUT CONTRAST TECHNIQUE: Contiguous axial images were obtained from the base of the skull through the vertex without intravenous contrast. COMPARISON:  02/07/2012 FINDINGS: There is encephalomalacia within the left frontal lobe compatible with chronic infarct. This is new since 02/07/2012. Patchy areas of low-attenuation are  identified 2 areas of subcortical low attenuation within the posterior right parietal lobe are identified and appear unchanged from previous exam. Prominence of the sulci and ventricles noted compatible with mild brain atrophy. No evidence for acute intracranial hemorrhage, mass or acute brain infarct. No abnormal extra-axial fluid collections identified. The paranasal sinuses and mastoid air cells are clear. The calvarium is intact. IMPRESSION: 1. No acute intracranial abnormalities. 2. Left frontal lobe encephalomalacia compatible with chronic infarct. New from previous exam. 3. Small vessel ischemic change, similar to previous exam. Electronically Signed   By: Kerby Moors M.D.   On: 01/13/2015 22:04   Dg Abd Portable 1v  01/16/2015  CLINICAL DATA:  Constipation and vomiting episodes this morning. EXAM: PORTABLE ABDOMEN - 1 VIEW COMPARISON:  CT 02/10/2014 and abdominal radiographs 06/20/2014 FINDINGS: Again noted is marked dextroscoliosis of the thoracolumbar spine. Few surgical clips scattered throughout the abdomen and pelvis. There is gas within the small and large bowel. Small amount amount of stool in the  colon. Multiple pelvic calcifications. IMPRESSION: Nonobstructive bowel gas pattern Electronically Signed   By: Markus Daft M.D.   On: 01/16/2015 09:52   Mr Brain Ltd W/o Cm  01/14/2015  CLINICAL DATA:  Initial evaluation for acute exacerbation of right-sided weakness. EXAM: MRI HEAD WITHOUT CONTRAST TECHNIQUE: Multiplanar, multiecho pulse sequences of the brain and surrounding structures were obtained without intravenous contrast. COMPARISON:  Prior CT from 01/13/2015. FINDINGS: Study is limited as only axial diffusion and sagittal T1 weighted sequences were performed. Diffusion-weighted imaging demonstrates a 11 x 9 mm focus of restricted diffusion within the left thalamus extending towards the left cerebral peduncle (series 4, image 23). Additional small 11 mm curvilinear focus of restricted  diffusion within knee adjacent posterior left internal capsule/ mesial left temporal lobe. No associated mass effect. Additional tiny punctate focus of restricted diffusion at the level of the right lentiform nucleus (series 4, image 26), also suspicious for a tiny acute infarct. No other infarct identified on this limited exam. Encephalomalacia within the left frontal lobe noted. No mass effect or midline shift. No hydrocephalus. No definite extra-axial fluid collection. Craniocervical junction within normal limits. Pituitary gland normal. Bone marrow signal intensity within normal limits. Scalp soft tissues are unremarkable. IMPRESSION: 1. Limited study with only diffusion and sagittal T1 weighted sequences performed. 2. 11 x 9 mm acute ischemic infarct centered at the inferior left thalamus/left cerebral peduncle, with adjacent 11 mm curvilinear infarct within the posterior limb of the left internal capsule/mesial left temporal lobe. No associated mass effect. 3. Additional punctate foci of diffusion abnormality within the right basal ganglia as above, also suspicious for tiny acute ischemic infarcts. 4. No other definite acute intracranial process on this limited study. Electronically Signed   By: Jeannine Boga M.D.   On: 01/14/2015 03:24     CBC  Recent Labs Lab 01/12/15 1336 01/13/15 2133 01/16/15 0644  WBC 6.5 7.4 9.9  HGB 10.6* 10.2* 11.0*  HCT 30.7* 30.3* 33.1*  PLT 165 167 179  MCV 91.9 93.2 95.1  MCH 31.7 31.4 31.6  MCHC 34.5 33.7 33.2  RDW 14.7 15.3 15.9*  LYMPHSABS  --  2.3  --   MONOABS  --  0.4  --   EOSABS  --  0.2  --   BASOSABS  --  0.0  --     Chemistries   Recent Labs Lab 01/12/15 1336 01/13/15 2133 01/16/15 0644 01/17/15 0410  NA 131* 135 142 143  K 5.4* 4.3 4.8 4.6  CL 101 109 116* 116*  CO2 19* 17* 17* 19*  GLUCOSE 504* 258* 70 74  BUN 65* 61* 69* 61*  CREATININE 2.70* 2.69* 3.12* 3.03*  CALCIUM 9.6 9.3 9.1 8.8*  AST  --  13*  --   --   ALT  --   15  --   --   ALKPHOS  --  80  --   --   BILITOT  --  0.4  --   --    ------------------------------------------------------------------------------------------------------------------ estimated creatinine clearance is 22.7 mL/min (by C-G formula based on Cr of 3.03). ------------------------------------------------------------------------------------------------------------------  Recent Labs  01/15/15 0534  HGBA1C 13.9*   ------------------------------------------------------------------------------------------------------------------  Recent Labs  01/15/15 0534  CHOL 224*  HDL 22*  LDLCALC UNABLE TO CALCULATE IF TRIGLYCERIDE OVER 400 mg/dL  TRIG 579*  CHOLHDL 10.2   ------------------------------------------------------------------------------------------------------------------ No results for input(s): TSH, T4TOTAL, T3FREE, THYROIDAB in the last 72 hours.  Invalid input(s): FREET3 ------------------------------------------------------------------------------------------------------------------ No results for input(s): VITAMINB12, FOLATE, FERRITIN, TIBC,  IRON, RETICCTPCT in the last 72 hours.  Coagulation profile  Recent Labs Lab 01/13/15 2133  INR 1.08    No results for input(s): DDIMER in the last 72 hours.  Cardiac Enzymes No results for input(s): CKMB, TROPONINI, MYOGLOBIN in the last 168 hours.  Invalid input(s): CK ------------------------------------------------------------------------------------------------------------------ Invalid input(s): POCBNP     Time Spent in minutes   30 minutes   Mohan Erven M.D on 01/17/2015 at 12:05 PM  Between 7am to 7pm - Pager - [phone]  After 7pm go to [email or web address] - password [facility]  [facility]   Office  [phone]

## 2015-01-17 NOTE — Progress Notes (Signed)
Hypoglycemic Event  CBG: 61 @ 6:34 Treatment: 4oz of cranberry juice  Symptoms: none  Follow-up CBG: Time: 6:56 CBG Result:69 Received 12.5g gvien IV recheck was 156   Possible Reasons for Event: patient not eating or drinking as usual  Comments/MD notified:yes    Anthoney Sheppard L

## 2015-01-17 NOTE — Progress Notes (Signed)
Occupational Therapy Treatment Patient Details Name: Michelle Holmes MRN: [id number] DOB: [birth year] Today's Date: 01/17/2015    History of present illness 64 y.o. female admitted for Rt sided weakness; MRI + Lt thalamus and internal capsule infarcts (?Rt basal ganglia). NIH progressed from 7 on admission to 13. Head CT showed no acute changes  PMHx-CVA, DM, CHF, CAD, colon Ca, emphysema   OT comments  Pt with lethargy this visit limiting participation. Dense R hemiplegia, but reports being able to identify sensation.   Follow Up Recommendations  CIR    Equipment Recommendations       Recommendations for Other Services      Precautions / Restrictions Precautions Precautions: Fall Restrictions Weight Bearing Restrictions: No       Mobility Bed Mobility           General bed mobility comments: pt in chair  Transfers Overall transfer level: Needs assistance Equipment used: 2 person hand held assist Transfers: Sit to/from Stand Sit to Stand: +2 physical assistance;Mod assist        General transfer comment: R knee blocked, assist to rise and for balance, performed x 1 to check need for pericare    Balance Overall balance assessment: Needs assistance   Sitting balance-Leahy Scale: Fair Sitting balance - Comments: at edge of chair     Standing balance-Leahy Scale: Zero Standing balance comment: Rt knee buckles                   ADL Overall ADL's : Needs assistance/impaired     Grooming: Wash/dry hands;Wash/dry face;Sitting;Maximal assistance (applying lotion to R UE)                                 General ADL Comments: Worked on locating ADL items on R side.       Vision                     Perception     Praxis      Cognition   Behavior During Therapy: Flat affect Overall Cognitive Status: Impaired/Different from baseline Area of Impairment: Awareness;Safety/judgement;Problem solving Current Attention Level:  Sustained to focused    Following Commands: Follows one step commands inconsistently Safety/Judgement: Decreased awareness of deficits Awareness: Intellectual Problem Solving: Slow processing;Decreased initiation;Difficulty sequencing;Requires verbal cues;Requires tactile cues      Extremity/Trunk Assessment               Exercises     Shoulder Instructions       General Comments      Pertinent Vitals/ Pain       Pain Assessment: No/denies pain  Home Living                                          Prior Functioning/Environment              Frequency Min 3X/week     Progress Toward Goals  OT Goals(current goals can now be found in the care plan section)  Progress towards OT goals: Not progressing toward goals - comment (lethargy)  Acute Rehab OT Goals Patient Stated Goal: wants a [employer]  Plan Discharge plan remains appropriate    Co-evaluation                 End of Session Equipment  Utilized During Treatment: Gait belt   Activity Tolerance Patient limited by lethargy   Patient Left in chair;with call bell/phone within reach;with chair alarm set   Nurse Communication          Time: [id number] OT Time Calculation (min): 17 min  Charges: OT General Charges $OT Visit: 1 Procedure OT Treatments $Neuromuscular Re-education: 8-22 mins  Malka So 01/17/2015, 11:45 AM  [phone]

## 2015-01-17 NOTE — Progress Notes (Signed)
Transcranial Doppler has been completed.   Landry Mellow, RDMS, RVT 01/17/2015

## 2015-01-17 NOTE — Progress Notes (Signed)
Pt currently has a 20g PIV in the L-AC that flushes great and has a good blood return. I informed Education administrator, she states she will continue to use this PIV. Michelle Holmes

## 2015-01-17 NOTE — Progress Notes (Signed)
Speech Language Pathology Treatment: Dysphagia  Patient Details Name: Michelle Holmes MRN: [id number] DOB: 10/10/1950 Today's Date: 01/17/2015 Time: [id number] SLP Time Calculation (min) (ACUTE ONLY): 23 min  Assessment / Plan / Recommendation Clinical Impression  Treatment focused on dysphagia goals given eagerness reported to advance diet. Patient alert, pleasant and cooperative. Able to recall strategies/aspiration precautions taught during initial evaluation independently however required in verbal and visual cues for carryover into practice. Advanced texture trials provided of both liquids (via straw) and solids( dysphagia 3). Mastication of dysphagia 3 solids intact with mild delays in oral transit and mildly reduced bolus cohesion but with full oral clearance and no overt indication of aspiration. 1 x cough out of 7-8 sips of thin liquid via straw noted due to large bolus size, eliminated with small single straw or cup sips. Educated patient and family friend regarding continued aspiration risk and necessity of aspiration precautions. Both verbalized understanding. Will advance diet and f/u for continued therapeutic intervention.    HPI HPI: 64 yo female adm to [facility] with right sided weakness,  Pt found to have left thalamic CVA.  PMH + for emphysema, prior CVA, disabled.  Speech evaluation ordered.  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 Pt initially passed RN stroke swallow screen, but on 12/21 thjere was concern for acute neurological change, therefore pt was made NPO and SLP swallow evaluation was ordered.      SLP Plan  Continue with current plan of care     Recommendations  Diet recommendations: Dysphagia 3 (mechanical soft);Thin liquid Liquids provided via: Cup;Straw Medication Administration: Whole meds with puree Supervision: Patient able to self feed;Full supervision/cueing for compensatory strategies Compensations: Slow rate;Small  sips/bites;Lingual sweep for clearance of pocketing Postural Changes and/or Swallow Maneuvers: Seated upright 90 degrees              Oral Care Recommendations: Oral care BID Follow up Recommendations: Inpatient Rehab Plan: Continue with current plan of care  [facility] [place], CCC-SLP [phone]  Michelle Holmes 01/17/2015, 4:00 PM

## 2015-01-17 NOTE — Clinical Social Work Note (Signed)
CSW received consult for SNF, CSW was informed that inpatient rehab will be able to accept patient on Friday.  CSW to sign off please reconsult if other social work needs arise.  Jones Broom. [place], MSW, [place] 01/17/2015 12:03 PM

## 2015-01-17 NOTE — Progress Notes (Signed)
Physical Therapy Treatment Patient Details Name: Michelle Holmes MRN: [id number] DOB: 1950-05-08 Today's Date: 01/17/2015    History of Present Illness 64 y.o. female admitted for Rt sided weakness; MRI + Lt thalamus and internal capsule infarcts (?Rt basal ganglia). NIH progressed from 7 on admission to 13. Head CT showed no acute changes  PMHx-CVA, DM, CHF, CAD, colon Ca, emphysema    PT Comments    Awake, alert. Able to attend to PT in fairly distracting environment (RN in to give meds, 2 daughters encouraging her, MD entered and talking to pt/daughters). Rt side weaker compared to 12/20 evaluation. MD aware. Continues to benefit from skillled PT to incr safety and independence with mobility.    Follow Up Recommendations  CIR     Equipment Recommendations   (TBD)    Recommendations for Other Services       Precautions / Restrictions Precautions Precautions: Fall Restrictions Weight Bearing Restrictions: No    Mobility  Bed Mobility Overal bed mobility: Needs Assistance Bed Mobility: Rolling;Sidelying to Sit Rolling: Mod assist         General bed mobility comments: HOB elevated, no rail. exit to her Lt  Transfers Overall transfer level: Needs assistance Equipment used: 2 person hand held assist Transfers: Sit to/from [facility] Sit to Stand: +2 physical assistance;Mod assist Stand pivot transfers: Max assist;+2 physical assistance       General transfer comment: +weight bearing on RLE, but unable to sustain and knee buckles when advancing LLE  Ambulation/Gait                 Stairs            Wheelchair Mobility    Modified Rankin (Stroke Patients Only) Modified Rankin (Stroke Patients Only) Pre-Morbid Rankin Score: No symptoms Modified Rankin: Severe disability     Balance     Sitting balance-Leahy Scale: Fair       Standing balance-Leahy Scale: Zero Standing balance comment: Rt knee buckles                    Cognition Arousal/Alertness: Awake/alert Behavior During Therapy:  (irritated with daughter)   Area of Impairment: Awareness;Safety/judgement;Problem solving Orientation Level:  (ox4) Current Attention Level: Selective     Safety/Judgement: Decreased awareness of deficits Awareness: Intellectual Problem Solving: Slow processing;Decreased initiation;Difficulty sequencing;Requires verbal cues;Requires tactile cues      Exercises      General Comments        Pertinent Vitals/Pain Pain Assessment: No/denies pain    Home Living                      Prior Function            PT Goals (current goals can now be found in the care plan section) Acute Rehab PT Goals Patient Stated Goal: none stated Time For Goal Achievement: 01/29/15 Progress towards PT goals: Progressing toward goals    Frequency  Min 4X/week    PT Plan Current plan remains appropriate    Co-evaluation             End of Session Equipment Utilized During Treatment: Gait belt Activity Tolerance: Patient tolerated treatment well Patient left: in chair;with call bell/phone within reach;with chair alarm set;with family/visitor present;with nursing/sitter in room;Other (comment) (MD)     TimeBD:[id number] PT Time Calculation (min) (ACUTE ONLY): 18 min  Charges:  $Therapeutic Activity: 8-22 mins  G Codes:      Chris Narasimhan 01/17/2015, 11:19 AM Pager [phone]

## 2015-01-17 NOTE — Progress Notes (Signed)
Inpatient Rehabilitation  Noted ongoing medical issues.  Have received insurance authorization for IP Rehab admission.  Will follow up tomorrow and if medically ready plan to admit tomorrow.    Carmelia Roller., CCC/SLP Admission Coordinator  [facility]  Cell [phone]

## 2015-01-17 NOTE — Progress Notes (Signed)
STROKE TEAM PROGRESS NOTE   HISTORY Michelle Holmes is an 64 y.o. female with history of diabetes mellitus, hypertension, hypercholesterolemia, COPD, previous stroke, and coronary artery disease, presenting with exacerbation of weakness involving her right side starting at 10:30 PM last night. Patient has also had abated blood sugar, including a level of 504 in the emergency room today. His been taking aspirin and Plavix daily. CT scan of her head acute changes. Frontal encephalomalacia from previous stroke was noted. NIH stroke score was 7.  LSN: 10:30 PM on 01/12/2015 tPA Given: No: Beyond time under for treatment consideration mRankin:   SUBJECTIVE (INTERVAL HISTORY) Patient had worsening of right hemiplegia  01/16/15 with depressed sensorium. Stat CT scan of the head showed no acute abnormality. She seems to be improving somewhat now.  Vitals signs have been stable . Dense right hemiplegia persists. OBJECTIVE Temp:  [97.8 F (36.6 C)-98.4 F (36.9 C)] 97.8 F (36.6 C) (12/22 0920) Pulse Rate:  [47-55] 47 (12/22 0920) Cardiac Rhythm:  [-] Sinus bradycardia (12/22 0833) Resp:  [16-20] 16 (12/22 0920) BP: (102-129)/(62-67) 120/66 mmHg (12/22 0920) SpO2:  [94 %-100 %] 100 % (12/22 0920)  CBC:   Recent Labs Lab 01/13/15 2133 01/16/15 0644  WBC 7.4 9.9  NEUTROABS 4.5  --   HGB 10.2* 11.0*  HCT 30.3* 33.1*  MCV 93.2 95.1  PLT 167 0000000    Basic Metabolic Panel:   Recent Labs Lab 01/16/15 0644 01/17/15 0410  NA 142 143  K 4.8 4.6  CL 116* 116*  CO2 17* 19*  GLUCOSE 70 74  BUN 69* 61*  CREATININE 3.12* 3.03*  CALCIUM 9.1 8.8*    Lipid Panel:     Component Value Date/Time   CHOL 224* 01/15/2015 0534   TRIG 579* 01/15/2015 0534   HDL 22* 01/15/2015 0534   CHOLHDL 10.2 01/15/2015 0534   VLDL UNABLE TO CALCULATE IF TRIGLYCERIDE OVER 400 mg/dL 01/15/2015 0534   LDLCALC UNABLE TO CALCULATE IF TRIGLYCERIDE OVER 400 mg/dL 01/15/2015 0534   HgbA1c:  Lab Results   Component Value Date   HGBA1C 13.9* 01/15/2015   Urine Drug Screen:     Component Value Date/Time   LABOPIA NONE DETECTED 01/13/2015 2238   COCAINSCRNUR NONE DETECTED 01/13/2015 2238   LABBENZ NONE DETECTED 01/13/2015 2238   AMPHETMU NONE DETECTED 01/13/2015 2238   THCU NONE DETECTED 01/13/2015 2238   LABBARB NONE DETECTED 01/13/2015 2238      IMAGING  Dg Chest 2 View 01/14/2015   Mild interstitial prominence of both lungs is partially due to borderline hypoinflation,but low-grade interstitial edema or other interstitial process may be present.  Ct Head Wo Contrast 01/13/2015   1. No acute intracranial abnormalities.  2. Left frontal lobe encephalomalacia compatible with chronic infarct. New from previous exam.  3. Small vessel ischemic change, similar to previous exam.    Mr Brain Ltd W/o Cm  01/14/2015   1. Limited study with only diffusion and sagittal T1 weighted sequences performed.  2. 11 x 9 mm acute ischemic infarct centered at the inferior left thalamus/left cerebral peduncle, with adjacent 11 mm curvilinear infarct within the posterior limb of the left internal capsule/mesial left temporal lobe. No associated mass effect.  3. Additional punctate foci of diffusion abnormality within the right basal ganglia as above, also suspicious for tiny acute ischemic infarcts.  4. No other definite acute intracranial process on this limited study.        PHYSICAL EXAM Pleasant middle-aged lady not in distress. Marland Kitchen  Afebrile. Head is nontraumatic. Neck is supple without bruit.    Cardiac exam no murmur or gallop. Lungs are clear to auscultation. Distal pulses are well felt. Neurological Exam :  drowsy but can be aroused Mild dysarthria but no aphasia. Follows commands well. Extraocular moments are full range without nystagmus. Fundi were not visualized. Vision acuity seems adequate. Mild right lower facial weakness. Tongue midline. Motor system exam revealed moderate right  hemiplegia 3/5 with weakness of right grip and intrinsic hand muscles. Slight right upper extremity drift. Mild weakness of right hip flexors and ankle dorsiflexors. Tone is increased on the right side. Deep tendon reflexes are brisker on the right compared to the left. Right plantar is upgoing left is downgoing. Sensation is diminished in the right hemibody. Gait was not tested. Coordination is slightly impaired on the right compared to the left. ASSESSMENT/PLAN [name] is a 64 y.o. female with history of diabetes mellitus, hypertension, hypercholesterolemia, COPD, previous stroke, and coronary artery disease presenting with exacerbation of weakness involving her right side . She did not receive IV t-PA due to late presentation.  Stroke:  Dominant left brain subcortical infarcts    from small vessel disease  Resultant  right hemiplegia -fluctuating exam ? Capsular warning syndrome  MRI  as noted above  MRA  not performed  TCD's - pending  Carotid Doppler - No significant plaque or stenosis at either carotid bifurcations. Bilateral vertebral artery flow is  antegrade.  2D Echo  EF 55-60%. No cardiac source of emboli identified.  LDL unable to calculate secondary to triglycerides of 712  HgbA1c 13.9  VTE prophylaxis - not DIET DYS 2 Room service appropriate?: Yes; Fluid consistency:: Thin  aspirin 81 mg daily and clopidogrel 75 mg daily prior to admission, now on aspirin 81 mg daily and clopidogrel 75 mg daily  Patient counseled to be compliant with her antithrombotic medications  Ongoing aggressive stroke risk factor management  Therapy recommendations: CLR  Disposition: Pending  Hypertension  Stable Permissive hypertension (OK if < 220/120) but gradually normalize in 5-7 days  Hyperlipidemia  Home meds:  Lipitor 40 mg daily resumed in hospital. Also on Lopid 600 mg twice a day prior to admission.  LDL could not be calculated, goal < 70  Continue Lipitor 40 mg  daily since patient is also on Lopid - confirm compliance with medications.  Continue statin at discharge  Diabetes  HgbA1c 13.9 goal < 7.0  Uncontrolled  Other Stroke Risk Factors  Advanced age  Cigarette smoker, quit smoking   Obesity, Body mass index is 37.86 kg/(m^2).   Hx stroke/TIA  Coronary artery disease   Other Active Problems  Anemia  Renal insufficiency   Hospital day # 4     I have personally examined this patient, reviewed notes, independently viewed imaging studies, participated in medical decision making and plan of care. I have made any additions or clarifications directly to the above note.   She presented with worsening of previous right-sided weakness secondary to new left brain subcortical infarct etiology likely small vessel disease. She has had a fluctuating right hemiplegia in the setting of initially severe had hyperglycemia and then hypoglycemia. I wonder if this may fit capsular warning syndrome   Patient again refuses MRI  And EEG y`day. Discussed with  Daughter at bedside Dr. Emeline Gins and answered questions Antony Contras, MD Medical Director [facility] Pager: [phone] 01/17/2015 1:32 PM     To contact Stroke Continuity provider, please refer  to [email or web address] After hours, contact General Neurology

## 2015-01-17 NOTE — PMR Pre-admission (Signed)
PMR Admission Coordinator Pre-Admission Assessment  Patient: Michelle Holmes is an 64 y.o., female MRN: [id number] DOB: July 25, 1950 Height: 5\' 5"  (165.1 cm) Weight: 103.193 kg (227 lb 8 oz)              Insurance Information HMO: yes    PPO:      PCP:      IPA:      80/20:      OTHER:  PRIMARY: U[employer] Medicare      Policy#: [id number]      Subscriber: self CM Name: Sherlynn Stalls      Phone#: [phone]     Fax#: [phone] Pre-Cert#: [id number]      Employer: not employed  Benefits:  Phone #: [phone]     Name: automated  Eff. Date: 05/27/14     Deduct: 0      Out of Pocket Max: $6700.00      Life Max: none CIR: $430.00 days 1-4, $0 days 5+      SNF: $0 days 1-20, $160.00 days 21-62, $0 days 63-100 Outpatient: PT/OT/SLP      Co-Pay: $40.00 Home Health: 100%      Co-Pay: none DME: 80%     Co-Pay: 20% Providers: in network   SECONDARY: Medicaid of [place]       Policy#: [id number]      Subscriber: self CM Name:       Phone#:      Fax#:  Pre-Cert#:       Employer: not employed  Benefits:  Phone #: [phone] Eff. Date: Eligible 01/17/15 Coverage code [id number] Deduct:       Out of Pocket Max:       Life Max:  CIR:       SNF:  Outpatient:      Co-Pay:  Home Health:       Co-Pay:  DME:      Co-Pay:   Medicaid Application Date:       Case Manager:  Disability Application Date:       Case Worker:   Emergency Contact Information Contact Information    Name Relation Home Work Mobile   Zarr,Denise Daughter   [phone]   Abdou,Quatina Daughter   [phone]     Current Medical History  Patient Admitting Diagnosis: Left subthalamic lacunar infarct    History of Present Illness: Michelle Holmes is a 64 y.o. right hand is female with history of COPD with remote tobacco abuse, systolic congestive heart failure, hypertension, diabetes mellitus peripheral neuropathy, chronic renal insufficiency with baseline creatinine 2.69, CAD with stenting maintained on aspirin and Plavix. History taken from  chart review and patient. Patient lives with family and used a cane prior to admission. One level home. She has assistance as needed from her daughter 4/7. Presented 01/14/2015 with right-sided weakness, slurred speech and blood sugar 504. MRI of the brain showed acute ischemic infarct centered at the inferior left thalamus/left cerebral peduncle with additional punctate foci of diffusion abnormality within the right basal ganglia. Echocardiogram with ejection fraction of 123456 grade 1 diastolic dysfunction. Carotid Dopplers with no ICA stenosis. Patient did not receive TPA. Neurology consulted presently maintained on aspirin and Plavix for CVA prophylaxis. Subcutaneous Lovenox for DVT prophylaxis.Follow up CT of the head secondary to lethargy 01/15/2105 with expected evolution of the inferior left thalamic lacunar infarct. Patient refused MRI MRA. ABGs unremarkable. EEG without seizure. Lethargy felt possibly related to Xanax and OxyContin that have been discontinued and mental status has improved. Bouts of  constipation with abdominal film showing nonobstructive bowel gas pattern and small amount of stool in the colon. Presently on a dysphagia 2 thin liquid diet.. Hemoglobin A1c 13.9 with insulin therapy as directed. Physical and occupational therapy evaluations completed 01/15/2015 with recommendations of physical medicine rehabilitation consult. Patient to be admitted for comprehensive inpatient rehabilitation program.  NIH Total: 15  Past Medical History  Past Medical History  Diagnosis Date  . Emphysema   . Colon cancer ([place])   . Hypertension   . Hypercholesteremia   . Diabetes mellitus   . COPD (chronic obstructive pulmonary disease) ([place])   . Coronary artery disease   . Obesity   . Sleep apnea   . TIA (transient ischemic attack)   . CHF (congestive heart failure) ([place])   . Anemia   . DJD (degenerative joint disease)   . Renal insufficiency   . Chronic back pain   . Scoliosis   . Obesity  hypoventilation syndrome ([place])   . Stroke ([place])   . Myocardial infarction [facility])     Family History  family history is not on file.  Prior Rehab/Hospitalizations:  Has the patient had major surgery during 100 days prior to admission? No  Current Medications   Current facility-administered medications:  .  0.9 %  sodium chloride infusion, 250 mL, Intravenous, PRN, Reubin Milan, MD .  aspirin EC tablet 81 mg, 81 mg, Oral, Daily, Reubin Milan, MD, 81 mg at 01/18/15 [id number] .  atorvastatin (LIPITOR) tablet 40 mg, 40 mg, Oral, q1800, Reubin Milan, MD, 40 mg at 01/17/15 1709 .  bisacodyl (DULCOLAX) suppository 10 mg, 10 mg, Rectal, Once, Albertine Patricia, MD, 10 mg at 01/16/15 0915 .  clopidogrel (PLAVIX) tablet 75 mg, 75 mg, Oral, Daily, Reubin Milan, MD, 75 mg at 01/18/15 0932 .  enoxaparin (LOVENOX) injection 40 mg, 40 mg, Subcutaneous, QHS, Reubin Milan, MD, 40 mg at 01/17/15 2250 .  feeding supplement (ENSURE ENLIVE) (ENSURE ENLIVE) liquid 237 mL, 237 mL, Oral, BID BM, Albertine Patricia, MD, 237 mL at 01/18/15 0940 .  fenofibrate tablet 54 mg, 54 mg, Oral, Daily, Modena Jansky, MD, 54 mg at 01/18/15 0931 .  ferrous sulfate tablet 325 mg, 325 mg, Oral, BID WC, Reubin Milan, MD, 325 mg at 01/18/15 0900 .  mometasone-formoterol (DULERA) 100-5 MCG/ACT inhaler 2 puff, 2 puff, Inhalation, BID, Reubin Milan, MD, 2 puff at 01/18/15 0825 .  ondansetron (ZOFRAN) injection 4 mg, 4 mg, Intravenous, Q6H PRN, Albertine Patricia, MD, 4 mg at 01/16/15 1834 .  pantoprazole (PROTONIX) EC tablet 40 mg, 40 mg, Oral, Daily, Reubin Milan, MD, 40 mg at 01/18/15 0931 .  pregabalin (LYRICA) capsule 50 mg, 50 mg, Oral, BID, Reubin Milan, MD, 50 mg at 01/18/15 0932 .  sodium chloride 0.9 % injection 3 mL, 3 mL, Intravenous, Q12H, Reubin Milan, MD, 3 mL at 01/18/15 0941 .  sodium chloride 0.9 % injection 3 mL, 3 mL, Intravenous, Q12H, Reubin Milan,  MD, 3 mL at 01/18/15 [id number] .  sodium chloride 0.9 % injection 3 mL, 3 mL, Intravenous, PRN, Reubin Milan, MD  Patients Current Diet: DIET DYS 3 Room service appropriate?: Yes; Fluid consistency:: Thin  Precautions / Restrictions Precautions Precautions: Fall Restrictions Weight Bearing Restrictions: No   Has the patient had 2 or more falls or a fall with injury in the past year?Yes, patient fell about 5 times at home prior to this admission  Prior Activity Level Community (5-7x/wk): Daughter [name] reports that her mother left the house daily with a family member.  She enjoyed shopping prior to admission.     Home Assistive Devices / Equipment Home Assistive Devices/Equipment: Cane (specify quad or straight), CBG Meter Home Equipment: Cane - single point  Prior Device Use: Indicate devices/aids used by the patient prior to current illness, exacerbation or injury? Walker, rollator   Prior Functional Level Prior Function Level of Independence: Independent with assistive device(s) (Rollator walker ) Comments: Unsure of PLOF. No family present.   Self Care: Did the patient need help bathing, dressing, using the toilet or eating?  Needed some help with bathing and dressing   Indoor Mobility: Did the patient need assistance with walking from room to room (with or without device)? Independent  Stairs: Did the patient need assistance with internal or external stairs (with or without device)? Needed some help  Functional Cognition: Did the patient need help planning regular tasks such as shopping or remembering to take medications? Independent  Current Functional Level Cognition  Arousal/Alertness: Awake/alert Overall Cognitive Status: Impaired/Different from baseline Difficult to assess due to: Level of arousal Current Attention Level: Sustained Orientation Level: Oriented to person, Oriented to place, Disoriented to time, Disoriented to situation Following Commands: Follows  one step commands inconsistently Safety/Judgement: Decreased awareness of deficits General Comments: Unsure fo cognitive baseline. Pt became agitated intermittently throughout session with no obvious cause.  Attention: Sustained Sustained Attention: Appears intact Memory: Appears intact (recalled had a cva) Awareness: Appears intact Problem Solving: Impaired Problem Solving Impairment: Functional basic (to locate call bell in bed on her right side, verbal/tactile cues required) Behaviors: Restless Safety/Judgment: Appears intact (need to call for assistance due ot sensory and motor deficits) Comments: pt able to name 4 animals in 60 seconds    Extremity Assessment (includes Sensation/Coordination)  Upper Extremity Assessment: Defer to OT evaluation RUE Deficits / Details: Flaccid UE, 1/5 biceps RUE Sensation: decreased light touch, decreased proprioception RUE Coordination: decreased fine motor, decreased gross motor  Lower Extremity Assessment: RLE deficits/detail RLE Deficits / Details: knee extension 3-, ankle DF 2+ RLE Sensation:  (difficult to assess due to decr communication) RLE Coordination: decreased gross motor    ADLs  Overall ADL's : Needs assistance/impaired Grooming: Wash/dry hands, Wash/dry face, Sitting, Maximal assistance (applying lotion to R UE) Toilet Transfer: Maximal assistance, +2 for physical assistance, Cueing for safety, Cueing for sequencing, Stand-pivot, BSC Toileting- Clothing Manipulation and Hygiene: Maximal assistance, +2 for physical assistance, Cueing for safety, Cueing for sequencing, Sit to/from stand Functional mobility during ADLs: Maximal assistance, +2 for physical assistance, Cueing for safety, Cueing for sequencing General ADL Comments: Worked on locating ADL items on R side.     Mobility  Overal bed mobility: Needs Assistance Bed Mobility: Rolling, Sidelying to Sit Rolling: Mod assist Sidelying to sit: Max assist, HOB elevated General  bed mobility comments: pt in chair    Transfers  Overall transfer level: Needs assistance Equipment used: 2 person hand held assist Transfers: Sit to/from Stand Sit to Stand: +2 physical assistance, Mod assist Stand pivot transfers: Max assist, +2 physical assistance General transfer comment: R knee blocked, assist to rise and for balance, performed x 1    Ambulation / Gait / Stairs / Wheelchair Mobility       Posture / Balance Dynamic Sitting Balance Sitting balance - Comments: at edge of chair Balance Overall balance assessment: Needs assistance Sitting-balance support: Single extremity supported Sitting balance-Leahy Scale: Fair Sitting balance -  Comments: at edge of chair Postural control: Posterior lean (may be due to body habitus) Standing balance support: Single extremity supported, During functional activity Standing balance-Leahy Scale: Zero Standing balance comment: Rt knee buckles    Special needs/care consideration BiPAP/CPAP none CPM none Continuous Drip IV D5 1/2 NS 100 mL/hr Dialysis none         Life Vest Oxygen 2L O2 [place] at home  Special Bed none Trach Size none Wound Vac (area) none  Skin dry  Bowel mgmt: 01/17/15 Bladder mgmt: inconsistent at times at baseline with depends worn daily  Diabetic mgmt HgbA1c 13.9 goal < 7.0    Previous Home Environment 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 Pt became angry and refused to answer any more questions.  Discharge Living Setting Plans for Discharge Living Setting: Patient's home, House Type of Home at Discharge: House Discharge Home Layout: One level Discharge Home Access: Level entry Discharge Bathroom  Shower/Tub: Tub/shower unit Discharge Bathroom Toilet: Standard (daughter reports ) Discharge Bathroom Accessibility: Yes How Accessible: Accessible via walker Does the patient have any problems obtaining your medications?: No  Social/Family/Support Systems Patient Roles: Parent Anticipated Caregiver: Daughters: Vallie Homsey [phone] Toni Amend [phone] Ability/Limitations of Caregiver: [name] reports that family will provide 24/7 assist as they did before Caregiver Availability: 24/7 Discharge Plan Discussed with Primary Caregiver: Yes Is Caregiver In Agreement with Plan?: Yes Does Caregiver/Family have Issues with Lodging/Transportation while Pt is in Rehab?: No  Goals/Additional Needs Patient/Family Goal for Rehab: PT/OT/SLP Supervision-Min assist  Expected length of stay: 20-24 days Cultural Considerations: none Dietary Needs: Dys.2 textures with thin liquids; heart healthy and carb modified restrictions  Equipment Needs: TBD Special Service Needs: none Pt/Family Agrees to Admission and willing to participate: Yes Program Orientation Provided & Reviewed with Pt/Caregiver Including Roles  & Responsibilities: Yes Additional Information Needs: Daughter was asking about a getting PCS from [place], which she had up until 8 months ago Information Needs to be Provided By: CSW  Decrease burden of Care through IP rehab admission: not anticipated   Possible need for SNF placement upon discharge: not anticipated   Patient Condition: This patient's medical and functional status has changed since the consult dated: 01/15/15 in which the Rehabilitation Physician determined and documented that the patient's condition is appropriate for intensive rehabilitative care in an inpatient rehabilitation facility. See "History of Present Illness" (above) for medical update. Functional changes are: Currently requiring max assist for pivot transfers. Patient's medical and functional status  update has been discussed with the Rehabilitation physician and patient remains appropriate for inpatient rehabilitation. Will admit to inpatient rehab today.  Preadmission Screen Completed By:  Retta Diones, 01/18/2015 10:20 AM ______________________________________________________________________   Discussed status with Dr. Posey Pronto on 01/18/15 at 20 and received telephone approval for admission today.  Admission Coordinator:  Retta Diones, time1024/Date12/23/16

## 2015-01-18 ENCOUNTER — Inpatient Hospital Stay (HOSPITAL_COMMUNITY)
Admission: RE | Admit: 2015-01-18 | Discharge: 2015-02-07 | DRG: 057 | Disposition: A | Discharge status: Home Health | Payer: Medicare Other | Source: Intra-hospital | Attending: Physical Medicine & Rehabilitation | Admitting: Physical Medicine & Rehabilitation

## 2015-01-18 DIAGNOSIS — I5022 Chronic systolic (congestive) heart failure: Secondary | ICD-10-CM

## 2015-01-18 DIAGNOSIS — Z79899 Other long term (current) drug therapy: Secondary | ICD-10-CM

## 2015-01-18 DIAGNOSIS — D696 Thrombocytopenia, unspecified: Secondary | ICD-10-CM | POA: Diagnosis not present

## 2015-01-18 DIAGNOSIS — I2510 Atherosclerotic heart disease of native coronary artery without angina pectoris: Secondary | ICD-10-CM | POA: Diagnosis present

## 2015-01-18 DIAGNOSIS — I69322 Dysarthria following cerebral infarction: Secondary | ICD-10-CM

## 2015-01-18 DIAGNOSIS — Z7982 Long term (current) use of aspirin: Secondary | ICD-10-CM

## 2015-01-18 DIAGNOSIS — Z955 Presence of coronary angioplasty implant and graft: Secondary | ICD-10-CM | POA: Diagnosis not present

## 2015-01-18 DIAGNOSIS — I5032 Chronic diastolic (congestive) heart failure: Secondary | ICD-10-CM

## 2015-01-18 DIAGNOSIS — T45515A Adverse effect of anticoagulants, initial encounter: Secondary | ICD-10-CM

## 2015-01-18 DIAGNOSIS — K5900 Constipation, unspecified: Secondary | ICD-10-CM

## 2015-01-18 DIAGNOSIS — I69391 Dysphagia following cerebral infarction: Secondary | ICD-10-CM | POA: Diagnosis not present

## 2015-01-18 DIAGNOSIS — R1310 Dysphagia, unspecified: Secondary | ICD-10-CM

## 2015-01-18 DIAGNOSIS — Z7902 Long term (current) use of antithrombotics/antiplatelets: Secondary | ICD-10-CM

## 2015-01-18 DIAGNOSIS — I69351 Hemiplegia and hemiparesis following cerebral infarction affecting right dominant side: Secondary | ICD-10-CM | POA: Diagnosis present

## 2015-01-18 DIAGNOSIS — I6381 Other cerebral infarction due to occlusion or stenosis of small artery: Secondary | ICD-10-CM | POA: Diagnosis present

## 2015-01-18 DIAGNOSIS — N184 Chronic kidney disease, stage 4 (severe): Secondary | ICD-10-CM | POA: Diagnosis not present

## 2015-01-18 DIAGNOSIS — Z87891 Personal history of nicotine dependence: Secondary | ICD-10-CM | POA: Diagnosis not present

## 2015-01-18 DIAGNOSIS — E662 Morbid (severe) obesity with alveolar hypoventilation: Secondary | ICD-10-CM | POA: Diagnosis not present

## 2015-01-18 DIAGNOSIS — I130 Hypertensive heart and chronic kidney disease with heart failure and stage 1 through stage 4 chronic kidney disease, or unspecified chronic kidney disease: Secondary | ICD-10-CM

## 2015-01-18 DIAGNOSIS — I25119 Atherosclerotic heart disease of native coronary artery with unspecified angina pectoris: Secondary | ICD-10-CM

## 2015-01-18 DIAGNOSIS — Z9981 Dependence on supplemental oxygen: Secondary | ICD-10-CM

## 2015-01-18 DIAGNOSIS — D649 Anemia, unspecified: Secondary | ICD-10-CM

## 2015-01-18 DIAGNOSIS — J449 Chronic obstructive pulmonary disease, unspecified: Secondary | ICD-10-CM | POA: Diagnosis not present

## 2015-01-18 DIAGNOSIS — Z794 Long term (current) use of insulin: Secondary | ICD-10-CM | POA: Diagnosis not present

## 2015-01-18 DIAGNOSIS — E1122 Type 2 diabetes mellitus with diabetic chronic kidney disease: Secondary | ICD-10-CM

## 2015-01-18 DIAGNOSIS — I252 Old myocardial infarction: Secondary | ICD-10-CM | POA: Diagnosis not present

## 2015-01-18 DIAGNOSIS — F0390 Unspecified dementia without behavioral disturbance: Secondary | ICD-10-CM

## 2015-01-18 DIAGNOSIS — M419 Scoliosis, unspecified: Secondary | ICD-10-CM

## 2015-01-18 DIAGNOSIS — M4806 Spinal stenosis, lumbar region: Secondary | ICD-10-CM

## 2015-01-18 DIAGNOSIS — Z85038 Personal history of other malignant neoplasm of large intestine: Secondary | ICD-10-CM

## 2015-01-18 DIAGNOSIS — M6289 Other specified disorders of muscle: Secondary | ICD-10-CM | POA: Diagnosis not present

## 2015-01-18 DIAGNOSIS — Z7951 Long term (current) use of inhaled steroids: Secondary | ICD-10-CM

## 2015-01-18 DIAGNOSIS — I69320 Aphasia following cerebral infarction: Secondary | ICD-10-CM | POA: Diagnosis not present

## 2015-01-18 DIAGNOSIS — I63212 Cerebral infarction due to unspecified occlusion or stenosis of left vertebral arteries: Secondary | ICD-10-CM | POA: Diagnosis not present

## 2015-01-18 DIAGNOSIS — I6322 Cerebral infarction due to unspecified occlusion or stenosis of basilar arteries: Secondary | ICD-10-CM | POA: Diagnosis not present

## 2015-01-18 DIAGNOSIS — R531 Weakness: Secondary | ICD-10-CM

## 2015-01-18 DIAGNOSIS — I6930 Unspecified sequelae of cerebral infarction: Secondary | ICD-10-CM | POA: Diagnosis not present

## 2015-01-18 DIAGNOSIS — E1142 Type 2 diabetes mellitus with diabetic polyneuropathy: Secondary | ICD-10-CM

## 2015-01-18 DIAGNOSIS — R4182 Altered mental status, unspecified: Secondary | ICD-10-CM | POA: Diagnosis not present

## 2015-01-18 DIAGNOSIS — I63219 Cerebral infarction due to unspecified occlusion or stenosis of unspecified vertebral arteries: Secondary | ICD-10-CM | POA: Diagnosis present

## 2015-01-18 DIAGNOSIS — G4733 Obstructive sleep apnea (adult) (pediatric): Secondary | ICD-10-CM | POA: Diagnosis not present

## 2015-01-18 DIAGNOSIS — N185 Chronic kidney disease, stage 5: Secondary | ICD-10-CM | POA: Diagnosis not present

## 2015-01-18 DIAGNOSIS — G4730 Sleep apnea, unspecified: Secondary | ICD-10-CM

## 2015-01-18 DIAGNOSIS — G8191 Hemiplegia, unspecified affecting right dominant side: Secondary | ICD-10-CM

## 2015-01-18 DIAGNOSIS — I639 Cerebral infarction, unspecified: Secondary | ICD-10-CM | POA: Diagnosis not present

## 2015-01-18 DIAGNOSIS — E785 Hyperlipidemia, unspecified: Secondary | ICD-10-CM | POA: Diagnosis not present

## 2015-01-18 DIAGNOSIS — R410 Disorientation, unspecified: Secondary | ICD-10-CM | POA: Diagnosis not present

## 2015-01-18 DIAGNOSIS — D6959 Other secondary thrombocytopenia: Secondary | ICD-10-CM | POA: Diagnosis not present

## 2015-01-18 DIAGNOSIS — E1100 Type 2 diabetes mellitus with hyperosmolarity without nonketotic hyperglycemic-hyperosmolar coma (NKHHC): Secondary | ICD-10-CM

## 2015-01-18 DIAGNOSIS — I69359 Hemiplegia and hemiparesis following cerebral infarction affecting unspecified side: Secondary | ICD-10-CM

## 2015-01-18 DIAGNOSIS — Z9989 Dependence on other enabling machines and devices: Secondary | ICD-10-CM

## 2015-01-18 LAB — GLUCOSE, CAPILLARY
Glucose-Capillary: 111 mg/dL — ABNORMAL HIGH (ref 65–99)
Glucose-Capillary: 147 mg/dL — ABNORMAL HIGH (ref 65–99)
Glucose-Capillary: 148 mg/dL — ABNORMAL HIGH (ref 65–99)
Glucose-Capillary: 67 mg/dL (ref 65–99)
Glucose-Capillary: 89 mg/dL (ref 65–99)
Glucose-Capillary: 93 mg/dL (ref 65–99)

## 2015-01-18 LAB — CBC WITH DIFFERENTIAL/PLATELET
Basophils Absolute: 0 10*3/uL (ref 0.0–0.1)
Basophils Relative: 0 %
Eosinophils Absolute: 0.2 10*3/uL (ref 0.0–0.7)
Eosinophils Relative: 4 %
HCT: 33.1 % — ABNORMAL LOW (ref 36.0–46.0)
Hemoglobin: 10.8 g/dL — ABNORMAL LOW (ref 12.0–15.0)
Lymphocytes Relative: 29 %
Lymphs Abs: 1.7 10*3/uL (ref 0.7–4.0)
MCH: 32.1 pg (ref 26.0–34.0)
MCHC: 32.6 g/dL (ref 30.0–36.0)
MCV: 98.5 fL (ref 78.0–100.0)
Monocytes Absolute: 0.3 10*3/uL (ref 0.1–1.0)
Monocytes Relative: 5 %
Neutro Abs: 3.5 10*3/uL (ref 1.7–7.7)
Neutrophils Relative %: 62 %
Platelets: 154 10*3/uL (ref 150–400)
RBC: 3.36 MIL/uL — ABNORMAL LOW (ref 3.87–5.11)
RDW: 15.7 % — ABNORMAL HIGH (ref 11.5–15.5)
WBC: 5.7 10*3/uL (ref 4.0–10.5)

## 2015-01-18 LAB — COMPREHENSIVE METABOLIC PANEL
ALT: 16 U/L (ref 14–54)
AST: 17 U/L (ref 15–41)
Albumin: 2.7 g/dL — ABNORMAL LOW (ref 3.5–5.0)
Alkaline Phosphatase: 57 U/L (ref 38–126)
Anion gap: 8 (ref 5–15)
BUN: 56 mg/dL — ABNORMAL HIGH (ref 6–20)
CO2: 21 mmol/L — ABNORMAL LOW (ref 22–32)
Calcium: 9.3 mg/dL (ref 8.9–10.3)
Chloride: 114 mmol/L — ABNORMAL HIGH (ref 101–111)
Creatinine, Ser: 3.11 mg/dL — ABNORMAL HIGH (ref 0.44–1.00)
GFR calc Af Amer: 17 mL/min — ABNORMAL LOW (ref >60)
GFR calc non Af Amer: 15 mL/min — ABNORMAL LOW (ref >60)
Glucose, Bld: 193 mg/dL — ABNORMAL HIGH (ref 65–99)
Potassium: 5.3 mmol/L — ABNORMAL HIGH (ref 3.5–5.1)
Sodium: 143 mmol/L (ref 135–145)
Total Bilirubin: 0.3 mg/dL (ref 0.3–1.2)
Total Protein: 6 g/dL — ABNORMAL LOW (ref 6.5–8.1)

## 2015-01-18 LAB — BASIC METABOLIC PANEL
Anion gap: 7 (ref 5–15)
BUN: 56 mg/dL — ABNORMAL HIGH (ref 6–20)
CO2: 22 mmol/L (ref 22–32)
Calcium: 9.1 mg/dL (ref 8.9–10.3)
Chloride: 115 mmol/L — ABNORMAL HIGH (ref 101–111)
Creatinine, Ser: 2.98 mg/dL — ABNORMAL HIGH (ref 0.44–1.00)
GFR calc Af Amer: 18 mL/min — ABNORMAL LOW (ref >60)
GFR calc non Af Amer: 16 mL/min — ABNORMAL LOW (ref >60)
Glucose, Bld: 100 mg/dL — ABNORMAL HIGH (ref 65–99)
Potassium: 4.9 mmol/L (ref 3.5–5.1)
Sodium: 144 mmol/L (ref 135–145)

## 2015-01-18 LAB — PHOSPHORUS: Phosphorus: 5.7 mg/dL — ABNORMAL HIGH (ref 2.5–4.6)

## 2015-01-18 LAB — MAGNESIUM: Magnesium: 2 mg/dL (ref 1.7–2.4)

## 2015-01-18 MED ORDER — ASPIRIN EC 81 MG PO TBEC
81.0000 mg | DELAYED_RELEASE_TABLET | Freq: Every day | ORAL | Status: DC
  Administered 2015-01-19 – 2015-02-07 (×20): 81 mg via ORAL
  Filled 2015-01-18 (×20): qty 1

## 2015-01-18 MED ORDER — PANTOPRAZOLE SODIUM 40 MG PO TBEC
40.0000 mg | DELAYED_RELEASE_TABLET | Freq: Every day | ORAL | Status: DC
  Administered 2015-01-19 – 2015-02-07 (×20): 40 mg via ORAL
  Filled 2015-01-18 (×20): qty 1

## 2015-01-18 MED ORDER — FENOFIBRATE 54 MG PO TABS
54.0000 mg | ORAL_TABLET | Freq: Every day | ORAL | Status: DC
  Administered 2015-01-19 – 2015-02-07 (×18): 54 mg via ORAL
  Filled 2015-01-18 (×23): qty 1

## 2015-01-18 MED ORDER — FLEET ENEMA 7-19 GM/118ML RE ENEM: 1.0000 | ENEMA | Freq: Every day | RECTAL | Status: DC | PRN

## 2015-01-18 MED ORDER — INSULIN ASPART 100 UNIT/ML ~~LOC~~ SOLN
0.0000 [IU] | Freq: Every day | SUBCUTANEOUS | Status: DC
  Administered 2015-01-20 – 2015-01-22 (×3): 3 [IU] via SUBCUTANEOUS
  Administered 2015-01-25: 2 [IU] via SUBCUTANEOUS
  Administered 2015-01-26: 3 [IU] via SUBCUTANEOUS
  Administered 2015-01-27 – 2015-02-02 (×3): 2 [IU] via SUBCUTANEOUS
  Administered 2015-02-03: 3 [IU] via SUBCUTANEOUS

## 2015-01-18 MED ORDER — MOMETASONE FURO-FORMOTEROL FUM 100-5 MCG/ACT IN AERO
2.0000 | INHALATION_SPRAY | Freq: Two times a day (BID) | RESPIRATORY_TRACT | Status: DC
  Administered 2015-01-18 – 2015-02-07 (×15): 2 via RESPIRATORY_TRACT
  Filled 2015-01-18 (×2): qty 8.8

## 2015-01-18 MED ORDER — PREGABALIN 50 MG PO CAPS
50.0000 mg | ORAL_CAPSULE | Freq: Two times a day (BID) | ORAL | Status: DC
  Administered 2015-01-18 – 2015-02-07 (×38): 50 mg via ORAL
  Filled 2015-01-18 (×40): qty 1

## 2015-01-18 MED ORDER — SORBITOL 70 % SOLN
30.0000 mL | Freq: Every day | Status: DC | PRN
  Administered 2015-01-29: 30 mL via ORAL
  Filled 2015-01-18 (×3): qty 30

## 2015-01-18 MED ORDER — ACETAMINOPHEN 325 MG PO TABS
325.0000 mg | ORAL_TABLET | ORAL | Status: DC | PRN
  Administered 2015-01-23: 650 mg via ORAL
  Filled 2015-01-18 (×2): qty 2

## 2015-01-18 MED ORDER — ENOXAPARIN SODIUM 40 MG/0.4ML ~~LOC~~ SOLN: 40.0000 mg | SUBCUTANEOUS | Status: DC

## 2015-01-18 MED ORDER — INSULIN ASPART 100 UNIT/ML ~~LOC~~ SOLN
0.0000 [IU] | Freq: Three times a day (TID) | SUBCUTANEOUS | Status: DC
  Administered 2015-01-18 – 2015-01-19 (×2): 1 [IU] via SUBCUTANEOUS
  Administered 2015-01-20 (×2): 2 [IU] via SUBCUTANEOUS
  Administered 2015-01-20 – 2015-01-22 (×6): 3 [IU] via SUBCUTANEOUS
  Administered 2015-01-22: 5 [IU] via SUBCUTANEOUS
  Administered 2015-01-23 – 2015-01-24 (×4): 3 [IU] via SUBCUTANEOUS
  Administered 2015-01-24 – 2015-01-25 (×4): 2 [IU] via SUBCUTANEOUS
  Administered 2015-01-25: 5 [IU] via SUBCUTANEOUS
  Administered 2015-01-26 (×2): 1 [IU] via SUBCUTANEOUS
  Administered 2015-01-26: 2 [IU] via SUBCUTANEOUS
  Administered 2015-01-27: 1 [IU] via SUBCUTANEOUS
  Administered 2015-01-27: 2 [IU] via SUBCUTANEOUS
  Administered 2015-01-27: 3 [IU] via SUBCUTANEOUS
  Administered 2015-01-28 (×2): 2 [IU] via SUBCUTANEOUS
  Administered 2015-01-28: 1 [IU] via SUBCUTANEOUS
  Administered 2015-01-29: 3 [IU] via SUBCUTANEOUS
  Administered 2015-01-30 – 2015-01-31 (×2): 1 [IU] via SUBCUTANEOUS
  Administered 2015-01-31 – 2015-02-01 (×2): 2 [IU] via SUBCUTANEOUS
  Administered 2015-02-01: 1 [IU] via SUBCUTANEOUS
  Administered 2015-02-02: 3 [IU] via SUBCUTANEOUS
  Administered 2015-02-03 (×2): 1 [IU] via SUBCUTANEOUS
  Administered 2015-02-04 – 2015-02-06 (×2): 2 [IU] via SUBCUTANEOUS

## 2015-01-18 MED ORDER — ALUM & MAG HYDROXIDE-SIMETH 200-200-20 MG/5ML PO SUSP
30.0000 mL | ORAL | Status: DC | PRN
  Filled 2015-01-18: qty 30

## 2015-01-18 MED ORDER — ATORVASTATIN CALCIUM 40 MG PO TABS
40.0000 mg | ORAL_TABLET | Freq: Every day | ORAL | Status: DC
  Administered 2015-01-18 – 2015-02-06 (×19): 40 mg via ORAL
  Filled 2015-01-18 (×7): qty 1
  Filled 2015-01-18: qty 2
  Filled 2015-01-18 (×12): qty 1

## 2015-01-18 MED ORDER — ENSURE ENLIVE PO LIQD
237.0000 mL | Freq: Two times a day (BID) | ORAL | Status: DC
  Administered 2015-01-20 – 2015-01-24 (×5): 237 mL via ORAL

## 2015-01-18 MED ORDER — CLOPIDOGREL BISULFATE 75 MG PO TABS
75.0000 mg | ORAL_TABLET | Freq: Every day | ORAL | Status: DC
  Administered 2015-01-19 – 2015-02-07 (×20): 75 mg via ORAL
  Filled 2015-01-18 (×20): qty 1

## 2015-01-18 MED ORDER — ONDANSETRON HCL 4 MG/2ML IJ SOLN: 4.0000 mg | Freq: Four times a day (QID) | INTRAMUSCULAR | Status: DC | PRN

## 2015-01-18 MED ORDER — ONDANSETRON HCL 4 MG PO TABS
4.0000 mg | ORAL_TABLET | Freq: Four times a day (QID) | ORAL | Status: DC | PRN
  Filled 2015-01-18: qty 1

## 2015-01-18 MED ORDER — FENOFIBRATE 54 MG PO TABS: 54.0000 mg | ORAL_TABLET | Freq: Every day | ORAL | Status: DC

## 2015-01-18 MED ORDER — BISACODYL 10 MG RE SUPP
10.0000 mg | Freq: Every day | RECTAL | Status: DC | PRN
  Administered 2015-01-26 – 2015-02-05 (×3): 10 mg via RECTAL
  Filled 2015-01-18 (×4): qty 1

## 2015-01-18 MED ORDER — FERROUS SULFATE 325 (65 FE) MG PO TABS
325.0000 mg | ORAL_TABLET | Freq: Two times a day (BID) | ORAL | Status: DC
  Administered 2015-01-18 – 2015-02-07 (×39): 325 mg via ORAL
  Filled 2015-01-18 (×39): qty 1

## 2015-01-18 MED ORDER — ENSURE ENLIVE PO LIQD: 237.0000 mL | Freq: Three times a day (TID) | ORAL | Status: DC

## 2015-01-18 MED ORDER — TRAZODONE HCL 50 MG PO TABS
50.0000 mg | ORAL_TABLET | Freq: Every evening | ORAL | Status: DC | PRN
  Administered 2015-01-23 – 2015-02-06 (×4): 50 mg via ORAL
  Filled 2015-01-18 (×4): qty 1

## 2015-01-18 MED ORDER — ENOXAPARIN SODIUM 40 MG/0.4ML ~~LOC~~ SOLN
40.0000 mg | Freq: Every day | SUBCUTANEOUS | Status: DC
  Administered 2015-01-18 – 2015-02-06 (×20): 40 mg via SUBCUTANEOUS
  Filled 2015-01-18 (×20): qty 0.4

## 2015-01-18 NOTE — Interval H&P Note (Signed)
Michelle Holmes was admitted today to Inpatient Rehabilitation with the diagnosis of Left subthalamic lacunar infarct..  The patient's history has been reviewed, patient examined, and there is no change in status.  Patient continues to be appropriate for intensive inpatient rehabilitation.  I have reviewed the patient's chart and labs.  Questions were answered to the patient's satisfaction. The PAPE has been reviewed and assessment remains appropriate.  Michelle Holmes Michelle Holmes 01/18/2015, 8:55 PM

## 2015-01-18 NOTE — H&P (Signed)
Physical Medicine and Rehabilitation Admission H&P    Chief Complaint  Patient presents with  . 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 MRI brain done revealing acute infarct left thalamus and left cerebral peduncle adjacent to limb of left internal capsul and tiny acute ischemic infarct right basal ganglia. Carotid dopplers without significant abnormality. 2D echo with EF 55-60% with no wall abnormality.  She has had worsening of symptoms with lethargy on  12/20 but refused repeat MRI.  Dr. Leonie Man felt that patient had left subcortical infarcts due to small vessel disease with fluctuating right hemiplegia ? Capsular warning syndrome. She is to continue ASA/Plavix for secondary stroke prevention as well as better control of risk factors. Patient currently with worsening of dysarthria and right sided weakness, dysphagia and fluctuating mental status affecting mobility, safety with diet and ability to carry out ADL tasks. Has had improvement in mentation today and diet advanced to dysphagia 3, thin liquids. CIR recommended by MD and rehab team for follow up therapy.     Review of Systems  HENT: Negative for hearing loss.   Eyes: Negative for blurred vision and double vision.  Respiratory: Negative for cough and shortness of breath.   Cardiovascular: Negative for chest pain.  Gastrointestinal: Positive for constipation. Negative for heartburn and abdominal pain.  Genitourinary: Negative for dysuria and urgency.  Skin: Negative for rash.  Neurological: Positive for speech change and focal weakness. Negative for dizziness and headaches.  Psychiatric/Behavioral: Positive for memory loss.  All other systems reviewed and are negative.      Past Medical History  Diagnosis Date  . Emphysema   . [place])   . Hypertension   . Hypercholesteremia   . Diabetes mellitus   . COPD (chronic obstructive pulmonary disease) ([place])   . Coronary artery disease   . Obesity   . Sleep apnea   . TIA (transient ischemic attack)   . CHF (congestive heart failure) ([place])   . Anemia   . DJD (degenerative joint disease)   . Renal insufficiency   . Chronic back pain   . Scoliosis   . Obesity hypoventilation syndrome ([place])   . Stroke ([place])   . Myocardial infarction [facility])     Past Surgical History  Procedure Laterality Date  . Coronary angioplasty with stent placement    . Colon surgery      History reviewed. No pertinent family history.    Social History:  reports that she has quit smoking. She does not have any smokeless tobacco history on file. She reports that she does not drink alcohol or use illicit drugs.     Allergies  Allergen Reactions  . Penicillins Hives and Swelling    Has patient had a PCN reaction causing immediate rash, facial/tongue/throat swelling, SOB or lightheadedness with hypotension: yes Has patient had a PCN reaction causing severe rash involving mucus membranes or skin necrosis: no Has patient had a PCN reaction that required hospitalization no Has patient had a PCN reaction occurring within the last 10 years: no If all of the above answers are "NO", then may proceed with Cephalosporin use.     Medications Prior to Admission  Medication Sig Dispense Refill  . ADVAIR DISKUS 250-50 MCG/DOSE AEPB  Inhale 1 puff into the lungs 2 (two) times daily.     Marland Kitchen alprazolam (XANAX) 2 MG tablet Take 4 mg by mouth at bedtime.    Marland Kitchen amLODipine (NORVASC) 10 MG tablet Take 10 mg by mouth daily.     Marland Kitchen aspirin EC 81 MG EC tablet Take 1 tablet (81 mg total) by mouth daily. 30 tablet 3  . atorvastatin (LIPITOR) 40 MG tablet Take 40 mg by mouth daily.  3  . clopidogrel (PLAVIX) 75 MG tablet Take 75 mg by mouth  daily.  3  . ferrous sulfate 325 (65 FE) MG tablet Take 1 tablet (325 mg total) by mouth 2 (two) times daily with a meal. 60 tablet 3  . gemfibrozil (LOPID) 600 MG tablet Take 600 mg by mouth 2 (two) times daily before a meal.    . insulin glargine (LANTUS) 100 UNIT/ML injection Inject 0.6 mLs (60 Units total) into the skin 2 (two) times daily. 10 mL 11  . losartan (COZAAR) 100 MG tablet Take 100 mg by mouth daily.     Marland Kitchen LYRICA 50 MG capsule Take 50 mg by mouth 2 (two) times daily.  3  . metoprolol succinate (TOPROL-XL) 100 MG 24 hr tablet Take 100 mg by mouth daily. Take with or immediately following a meal.    . ondansetron (ZOFRAN) 4 MG tablet Take 1 tablet (4 mg total) by mouth every 6 (six) hours. (Patient taking differently: Take 4 mg by mouth every 8 (eight) hours as needed for nausea. ) 12 tablet 0  . OxyCODONE (OXYCONTIN) 80 mg T12A 12 hr tablet Take 80 mg by mouth 2 (two) times daily as needed (pain).   0  . pantoprazole (PROTONIX) 40 MG tablet Take 40 mg by mouth daily.  3  . predniSONE (DELTASONE) 5 MG tablet Tapering dose for 12 days 12-14 to 12-26  6 for 2 days, 5 for 2 days, 4 for 2 days, 3 for 2 days, 2 for 2 days, 1 for 2 days  0  . rosuvastatin (CRESTOR) 10 MG tablet Take 10 mg by mouth daily.     . traMADol (ULTRAM) 50 MG tablet Take 50 mg by mouth every 4 (four) hours as needed for moderate pain.   0    Home: Home Living Family/patient expects to be discharged to:: Private residence Living Arrangements: Other relatives Media planner and Film/video editor) Available Help at Discharge: Family, Available 24 hours/day Type of Home: House Home Access: Level entry Home Layout: One level Bathroom Shower/Tub: Gaffer, Door Home Equipment: Cane - single point Additional Comments: Unsure of accuracy of pt's responses. Pt became angry and refused to answer any more questions.  Lives With: Family   Functional History: Prior Function Level of Independence: Independent with assistive  device(s) (Rollator walker ) Comments: Unsure of PLOF. No family present.   Functional Status:  Mobility: Bed Mobility Overal bed mobility: Needs Assistance Bed Mobility: Rolling, Sidelying to Sit Rolling: Mod assist Sidelying to sit: Max assist, HOB elevated General bed mobility comments: pt in chair Transfers Overall transfer level: Needs assistance Equipment used: 2 person hand held assist Transfers: Sit to/from Stand Sit to Stand: +2 physical assistance, Mod assist Stand pivot transfers: Max assist, +2 physical assistance General transfer comment: R knee blocked, assist to rise and for balance, performed x 1      ADL: ADL Overall ADL's : Needs assistance/impaired Grooming: Wash/dry hands, Wash/dry face, Sitting, Maximal assistance (applying lotion to R UE) Toilet Transfer: Maximal assistance, +2 for  physical assistance, Cueing for safety, Cueing for sequencing, Stand-pivot, BSC Toileting- Clothing Manipulation and Hygiene: Maximal assistance, +2 for physical assistance, Cueing for safety, Cueing for sequencing, Sit to/from stand Functional mobility during ADLs: Maximal assistance, +2 for physical assistance, Cueing for safety, Cueing for sequencing General ADL Comments: Worked on locating ADL items on R side.   Cognition: Cognition Overall Cognitive Status: Impaired/Different from baseline Arousal/Alertness: Awake/alert Orientation Level: Oriented to person, Oriented to place, Disoriented to time, Disoriented to situation Attention: Sustained Sustained Attention: Appears intact Memory: Appears intact (recalled had a cva) Awareness: Appears intact Problem Solving: Impaired Problem Solving Impairment: Functional basic (to locate call bell in bed on her right side, verbal/tactile cues required) Behaviors: Restless Safety/Judgment: Appears intact (need to call for assistance due ot sensory and motor deficits) Comments: pt able to name 4 animals in 60  seconds Cognition Arousal/Alertness: Lethargic Behavior During Therapy: Flat affect Overall Cognitive Status: Impaired/Different from baseline Area of Impairment: Awareness, Safety/judgement, Problem solving Orientation Level:  (ox4) Current Attention Level: Sustained Memory: Decreased recall of precautions, Decreased short-term memory Following Commands: Follows one step commands inconsistently Safety/Judgement: Decreased awareness of deficits Awareness: Intellectual Problem Solving: Slow processing, Decreased initiation, Difficulty sequencing, Requires verbal cues, Requires tactile cues General Comments: Unsure fo cognitive baseline. Pt became agitated intermittently throughout session with no obvious cause.  Difficult to assess due to: Level of arousal   Blood pressure 122/70, pulse 55, temperature 97.9 F (36.6 C), temperature source Oral, resp. rate 18, height 5' 5" (1.651 m), weight 103.193 kg (227 lb 8 oz), SpO2 99 %. Physical Exam  Nursing note and vitals reviewed. Constitutional: She is oriented to person, place, and time. She appears well-developed and well-nourished.  HENT:  Head: Normocephalic and atraumatic.  Eyes: Conjunctivae and EOM are normal. Pupils are equal, round, and reactive to light.  Neck: Normal range of motion. Neck supple.  Cardiovascular: Normal rate and regular rhythm.   Respiratory: Effort normal and breath sounds normal. No respiratory distress. She has no wheezes.  GI: Soft. Bowel sounds are normal. There is no tenderness.  Musculoskeletal: She exhibits no edema or tenderness.  Neurological: She is alert and oriented to person, place, and time.  Has poor postural awareness.  Right facial weakness with dysarthric speech and occasional pooling of oral secretions.  Easily distracted needing redirection and was belligerent at times.  Able to follow simple one and occasional two step motor commands.  Exam limited by poor participation.   Motor: LUE: 4/5  proximal to distal LLE: 4/5 proximal to distal  RLE: 2/5 hip flexion, 4-/5 ankle dorsi/plantar flexion  RUE: 0/5  Sensation appears to be intact to light touch Right facial weakness Right tongue deviation  Skin: Skin is warm and dry. No erythema.  Psychiatric: Her affect is blunt. Her speech is delayed. She is slowed. Cognition and memory are impaired. She expresses inappropriate judgment.    Results for orders placed or performed during the hospital encounter of 01/13/15 (from the past 48 hour(s))  Glucose, capillary     Status: Abnormal   Collection Time: 01/16/15  4:45 PM  Result Value Ref Range   Glucose-Capillary 106 (H) 65 - 99 mg/dL  Glucose, capillary     Status: Abnormal   Collection Time: 01/16/15  9:31 PM  Result Value Ref Range   Glucose-Capillary 112 (H) 65 - 99 mg/dL   Comment 1 Notify RN    Comment 2 Document in Chart   Basic metabolic panel     Status:  Abnormal   Collection Time: 01/17/15  4:10 AM  Result Value Ref Range   Sodium 143 135 - 145 mmol/L   Potassium 4.6 3.5 - 5.1 mmol/L   Chloride 116 (H) 101 - 111 mmol/L   CO2 19 (L) 22 - 32 mmol/L   Glucose, Bld 74 65 - 99 mg/dL   BUN 61 (H) 6 - 20 mg/dL   Creatinine, Ser 3.03 (H) 0.44 - 1.00 mg/dL   Calcium 8.8 (L) 8.9 - 10.3 mg/dL   GFR calc non Af Amer 15 (L) >60 mL/min   GFR calc Af Amer 18 (L) >60 mL/min    Comment: (NOTE) The eGFR has been calculated using the CKD EPI equation. This calculation has not been validated in all clinical situations. eGFR's persistently <60 mL/min signify possible Chronic Kidney Disease.    Anion gap 8 5 - 15  Glucose, capillary     Status: Abnormal   Collection Time: 01/17/15  6:34 AM  Result Value Ref Range   Glucose-Capillary 61 (L) 65 - 99 mg/dL   Comment 1 Notify RN    Comment 2 Document in Chart   Glucose, capillary     Status: None   Collection Time: 01/17/15  6:56 AM  Result Value Ref Range   Glucose-Capillary 69 65 - 99 mg/dL   Comment 1 Notify RN    Comment  2 Document in Chart   Glucose, capillary     Status: None   Collection Time: 01/17/15  7:12 AM  Result Value Ref Range   Glucose-Capillary 66 65 - 99 mg/dL   Comment 1 Notify RN    Comment 2 Document in Chart   Glucose, capillary     Status: Abnormal   Collection Time: 01/17/15  7:29 AM  Result Value Ref Range   Glucose-Capillary 156 (H) 65 - 99 mg/dL  Glucose, capillary     Status: Abnormal   Collection Time: 01/17/15 11:23 AM  Result Value Ref Range   Glucose-Capillary 135 (H) 65 - 99 mg/dL   Comment 1 Notify RN    Comment 2 Document in Chart   Glucose, capillary     Status: Abnormal   Collection Time: 01/17/15  5:02 PM  Result Value Ref Range   Glucose-Capillary 140 (H) 65 - 99 mg/dL   Comment 1 Notify RN    Comment 2 Document in Chart   Glucose, capillary     Status: None   Collection Time: 01/17/15 10:15 PM  Result Value Ref Range   Glucose-Capillary 66 65 - 99 mg/dL   Comment 1 Notify RN    Comment 2 Document in Chart   Glucose, capillary     Status: None   Collection Time: 01/18/15 12:16 AM  Result Value Ref Range   Glucose-Capillary 67 65 - 99 mg/dL   Comment 1 Notify RN    Comment 2 Document in Chart   Glucose, capillary     Status: None   Collection Time: 01/18/15  1:40 AM  Result Value Ref Range   Glucose-Capillary 93 65 - 99 mg/dL   Comment 1 Notify RN    Comment 2 Document in Chart   Basic metabolic panel     Status: Abnormal   Collection Time: 01/18/15  4:41 AM  Result Value Ref Range   Sodium 144 135 - 145 mmol/L   Potassium 4.9 3.5 - 5.1 mmol/L   Chloride 115 (H) 101 - 111 mmol/L   CO2 22 22 - 32 mmol/L   Glucose,  Bld 100 (H) 65 - 99 mg/dL   BUN 56 (H) 6 - 20 mg/dL   Creatinine, Ser 2.98 (H) 0.44 - 1.00 mg/dL   Calcium 9.1 8.9 - 10.3 mg/dL   GFR calc non Af Amer 16 (L) >60 mL/min   GFR calc Af Amer 18 (L) >60 mL/min    Comment: (NOTE) The eGFR has been calculated using the CKD EPI equation. This calculation has not been validated in all clinical  situations. eGFR's persistently <60 mL/min signify possible Chronic Kidney Disease.    Anion gap 7 5 - 15  Magnesium     Status: None   Collection Time: 01/18/15  4:41 AM  Result Value Ref Range   Magnesium 2.0 1.7 - 2.4 mg/dL  Phosphorus     Status: Abnormal   Collection Time: 01/18/15  4:41 AM  Result Value Ref Range   Phosphorus 5.7 (H) 2.5 - 4.6 mg/dL  Glucose, capillary     Status: None   Collection Time: 01/18/15  6:58 AM  Result Value Ref Range   Glucose-Capillary 89 65 - 99 mg/dL   Comment 1 Notify RN    Comment 2 Document in Chart   Glucose, capillary     Status: Abnormal   Collection Time: 01/18/15 11:29 AM  Result Value Ref Range   Glucose-Capillary 111 (H) 65 - 99 mg/dL   No results found.     Medical Problem List and Plan: 1.  Dysphagia, Aphasia, hemiparesis secondary to Left subthalamic lacunar infarct 2.  DVT Prophylaxis/Anticoagulation: Pharmaceutical: Lovenox 3. Chronic back pain/ Pain Management: On lyrica bid.  Off OxyContin and ultram at this time. Will use  Tylenol prn to prevent sedation.  4. Mood: LCSW to follow for evaluation and support.  5. Neuropsych: This patient is  not capable of making decisions on her own behalf. 6. Skin/Wound Care: Maintain adequate nutrition and hydration status. Routine pressure relief measures.  7. Fluids/Electrolytes/Nutrition: Monitor I/O. Check lytes in am. Offer supplements if intake poor. Will add protein supplement.  8. COPD: Continue IS. On dulera bid.  9. OSA/OHS: Question use of CPAP at bedtime 10. Dyslipidemia: Continue Lipitor and fenofibrate. 11.  DM type 2: Monitor BS ac/hs.  Lantus on hold due to hypoglycemic episodes.  Use SSI for elevated BS --intake should improve as bouts of lethargy resolve.   12. HTN: Monitor TID and slowly resume home medications. Avoid hypotension to allow for adequate perfusion.   13. CKD stage 4: Baseline creatinine at 2.7 per records review--was as high as 3.2-3.5 in  May and 2.7  in 08/2014.  Continue to  Monitor with serial checks. Encourage po intake.      Post Admission Physician Evaluation: 1. Functional deficits secondary  to Left subthalamic lacunar infarct. 2. Patient is admitted to receive collaborative, interdisciplinary care between the physiatrist, rehab nursing staff, and therapy team. 3. Patient's level of medical complexity and substantial therapy needs in context of that medical necessity cannot be provided at a lesser intensity of care such as a SNF. 4. Patient has experienced substantial functional loss from his/her baseline which was documented above under the "Functional History" and "Functional Status" headings.  Judging by the patient's diagnosis, physical exam, and functional history, the patient has potential for functional progress which will result in measurable gains while on inpatient rehab.  These gains will be of substantial and practical use upon discharge  in facilitating mobility and self-care at the household level. 5. Physiatrist will provide 24 hour management of medical  needs as well as oversight of the therapy plan/treatment and provide guidance as appropriate regarding the interaction of the two. 6. 24 hour rehab nursing will assist with bladder management, safety, disease management, medication administration and patient education and help integrate therapy concepts, techniques,education, etc. 7. PT will assess and treat for/with: Lower extremity strength, range of motion, stamina, balance, functional mobility, safety, adaptive techniques and equipment, woundcare, coping skills, pain control, education.   Goals are: Min A. 8. OT will assess and treat for/with: ADL's, functional mobility, safety, upper extremity strength, adaptive techniques and equipment, wound mgt, ego support, and community reintegration.   Goals are: min A. Therapy may proceed with showering this patient. 9. SLP will assess and treat for/with: speech, language,  swallowing, higher level cognition.  Goals are: Min A/Supervision. 10. Case Management and Social Worker will assess and treat for psychological issues and discharge planning. 11. Team conference will be held weekly to assess progress toward goals and to determine barriers to discharge. 12. Patient will receive at least 3 hours of therapy per day at least 5 days per week. 13. ELOS: 20-24 days.       14. Prognosis:  good     Delice Lesch, MD 01/18/2015

## 2015-01-18 NOTE — Progress Notes (Signed)
Rehab admissions - I met with patient this am.  She is agreeable to inpatient rehab admission today.  She tells me that she feels better.  I spoke with attending MD who says patient medically ready for inpatient rehab admission today.  Bed available and will admit to inpatient rehab today.  Call me for questions.  #[phone]

## 2015-01-18 NOTE — Discharge Summary (Signed)
Michelle Holmes, is a 64 y.o. female  DOB [birth year]  MRN [id number].  Admission date:  01/13/2015  Admitting Physician  Reubin Milan, MD  Discharge Date:  01/18/2015   Primary MD  Charolette Forward, MD  Recommendations for accepting physician for things to follow:  - Check CBC, BMP in 48 hours. - Patient with known history of diabetes, will be discharged on no insulin regimen giving her hypoglycemia during hospital stay, to have CBG monitor 3 times a day and before bedtime, once her oral intake improves, and CABG start to increase she can be started on insulin sliding scale regimen. -  Recommend outpatient nephrology consultation and follow-up for chronic kidney disease.   Admission Diagnosis  Dysarthria [R47.1] Right sided weakness [M62.89]   Discharge Diagnosis  Dysarthria [R47.1] Right sided weakness [M62.89]    Principal Problem:   CVA (cerebral vascular accident) ([place]) Active Problems:   Uncontrolled type 2 DM with hyperosmolar nonketotic hyperglycemia (HCC)   CHF (congestive heart failure) (HCC)   Chronic renal failure   CAD (coronary artery disease)   Right sided weakness   CVA (cerebral infarction)   Right hemiplegia (HCC)   Acute ischemic stroke (HCC)   AKI (acute kidney injury) ([place])   Dysarthria   Lethargy   DM type 2 with diabetic peripheral neuropathy (HCC)   Labile blood pressure   Hyperlipidemia   History of CVA with residual deficit   Chronic obstructive pulmonary disease (HCC)   Hemiparesis, aphasia, and dysphagia as late effect of cerebrovascular accident (CVA) ([place])      Past Medical History  Diagnosis Date  . Emphysema   . Colon cancer ([place])   . Hypertension   . Hypercholesteremia   . Diabetes mellitus   . COPD (chronic obstructive pulmonary disease) ([place])   . Coronary artery disease   . Obesity   . Sleep apnea   . TIA (transient ischemic attack)   . CHF  (congestive heart failure) ([place])   . Anemia   . DJD (degenerative joint disease)   . Renal insufficiency   . Chronic back pain   . Scoliosis   . Obesity hypoventilation syndrome ([place])   . Stroke ([place])   . Myocardial infarction [facility])     Past Surgical History  Procedure Laterality Date  . Coronary angioplasty with stent placement    . Colon surgery         History of present illness and  Hospital Course:     Kindly see H&P for history of present illness and admission details, please review complete Labs, Consult reports and Test reports for all details in brief  HPI  from the history and physical done on the day of admission 01/14/2015 Michelle Holmes is a 64 y.o. female with a past medical history of emphysema, colon cancer, hypertension, hyperlipidemia, CHF, CAD, chronic renal insufficiency, previous CVA who presents to the emergency department with right-sided weakness since 22:30 Saturday evening and elevated blood glucose. She was seen yesterday in the emergency department due  to a blood glucose of 504 mg/dL and returns due to her neurological symptoms. Per patient, she has had similar stroke reactivation symptoms in the past when her blood glucose increases. She denies headache, blurred vision, photophobia or phonophobia. Denies chest pain, palpitations, diaphoresis, dyspnea. She has occasional pitting edema lower extremities.  When seen, patient was in no acute distress. She denied any new change in her symptoms.   Hospital Course  64 year old female with history of COPD, OSA/OHS, chronic respiratory failure on home oxygen 2 L/m, HTN, HLD, chronic diastolic CHF, CAD, stage IV CKD, DM 2, colon cancer and CVA, presented to the ED with right-sided weakness that began at 10:30 PM on 01/12/15. She also had elevated blood sugar of 504 in the emergency department. Admitted for stroke evaluation. Neurology consulted.  Acute left brain stroke - resultant R hemiplegia - Etiology: Possibly  small vessel disease - MRI brain: Limited study. 11 x 9 cm acute ischemic infarct centered at the inferior left thalamus/left cerebral peduncle, with adjacent 11 mm curvilinear infarct within the posterior limb of the left internal capsule/Mecial left temporal lobe & additional punctate foci of diffusion abnormality within the right basal ganglia also suspicious for tiny acute ischemic infarcts - MRA brain: Patient has been consistently refusing, tried again 12/21 and 12/22, refused again. - TCD done. - Carotid Dopplers: No plaque or stenosis seen in ICAs bilaterally. Vertebral artery flow is antegrade - 2-D echo: LVEF 55-60 percent and grade 1 diastolic dysfunction. - [id number]: 13.9,  7 month ago was 11.9.  - Lipid panel: Unable to calculate LDL secondary to very high triglycerides. - Patient was on aspirin 81 MG + Plavix 75 MG PTA-continue same on discharge. - Neurology consultation appreciated. - Patient to be discharged to CIR  Uncontrolled DM 2/IDDM - Claims compliance with Lantus but not to her diet. - Hemoglobin A1c 13.9. - Initially patient resume on her home dose Lantus regimen 60 units twice daily, she became hypoglycemic secondary to poor oral intake, long-acting insulin has been stopped, required D5 half-normal saline, oral intake has been improving, D5 half-normal still and has been stopped overnight, but that has improved. - Patient will be discharged on CBG monitoring, once it's become evicted she can visit resumed back on insulin sliding scale, and Lantus.  Vomiting - No recurrence. KUB with no acute findings.  Essential hypertension - Will discontinue metoprolol secondary to soft blood pressure, and bradycardia, we'll not start Cozaar giving her renal failure. - Blood pressure has been acceptable during hospital stay off medication, she will be discharged without antihypertensive meds, can be resumed gradually if blood pressure start to increase.  COPD, OSA/OHS, chronic  respiratory failure with hypoxia - Stable. Continue oxygen. Former smoker.  Morbid obesity/Body mass index is 37.86 kg/(m^2).  Chronic diastolic CHF - Compensated  Hyperlipidemia/hypertriglyceridemia - LDL goal <70. Continue atorvastatin.Unable to calculate LDL secondary to very high triglycerides.? Compliance. Change gemfibrozil to fenofibrate to decrease risk of rhabdo, as per pharmacy recommendation.  Anemia - Stable  Acute on chronic kidney disease Stage IV /metabolic acidosis - Baseline creatinine: 2.7. Creatinine at baseline. Creatinine is 2.9  today, ,continue to monitor -  Recommend outpatient nephrology consultation and follow-up.    Discharge Condition:  stable   Follow UP  Follow-up Information    Schedule an appointment as soon as possible for a visit with Charolette Forward, MD.   Specialty:  Cardiology   Why:  Posthospitalization follow-up   Contact information:   [address] [place] [address] [phone]         Discharge Instructions  and  Discharge Medications     Discharge Instructions    Discharge instructions    Complete by:  As directed   Follow with Primary MD Charolette Forward, MD after discharge from [place], CMP,  checked  by Primary MD next visit.    Activity: As tolerated with Full fall precautions use walker/cane & assistance as needed   Disposition CIR   Diet: Heart Healthy , dysphagia 3 with thin liquids, with feeding assistance and aspiration precautions.  For Heart failure patients - Check your Weight same time everyday, if you gain over 2 pounds, or you develop in leg swelling, experience more shortness of breath or chest pain, call your Primary MD immediately. Follow Cardiac Low Salt Diet and 1.5 lit/day fluid restriction.   On your next visit with your primary care physician please Get Medicines reviewed and adjusted.   Please request your Prim.MD to go over all Hospital Tests and  Procedure/Radiological results at the follow up, please get all Hospital records sent to your Prim MD by signing hospital release before you go home.   If you experience worsening of your admission symptoms, develop shortness of breath, life threatening emergency, suicidal or homicidal thoughts you must seek medical attention immediately by calling 911 or calling your MD immediately  if symptoms less severe.  You Must read complete instructions/literature along with all the possible adverse reactions/side effects for all the Medicines you take and that have been prescribed to you. Take any new Medicines after you have completely understood and accpet all the possible adverse reactions/side effects.   Do not drive, operating heavy machinery, perform activities at heights, swimming or participation in water activities or provide baby sitting services if your were admitted for syncope or siezures until you have seen by Primary MD or a Neurologist and advised to do so again.  Do not drive when taking Pain medications.    Do not take more than prescribed Pain, Sleep and Anxiety Medications  Special Instructions: If you have smoked or chewed Tobacco  in the last 2 yrs please stop smoking, stop any regular Alcohol  and or any Recreational drug use.  Wear Seat belts while driving.   Please note  You were cared for by a hospitalist during your hospital stay. If you have any questions about your discharge medications or the care you received while you were in the hospital after you are discharged, you can call the unit and asked to speak with the hospitalist on call if the hospitalist that took care of you is not available. Once you are discharged, your primary care physician will handle any further medical issues. Please note that NO REFILLS for any discharge medications will be authorized once you are discharged, as it is imperative that you return to your primary care physician (or establish a  relationship with a primary care physician if you do not have one) for your aftercare needs so that they can reassess your need for medications and monitor your lab values.     Increase activity slowly    Complete by:  As directed             Medication List    STOP taking these medications        alprazolam 2 MG tablet  Commonly known as:  XANAX     amLODipine 10 MG tablet  Commonly known as:  NORVASC  gemfibrozil 600 MG tablet  Commonly known as:  LOPID     insulin glargine 100 UNIT/ML injection  Commonly known as:  LANTUS     losartan 100 MG tablet  Commonly known as:  COZAAR     metoprolol succinate 100 MG 24 hr tablet  Commonly known as:  TOPROL-XL     ondansetron 4 MG tablet  Commonly known as:  ZOFRAN     oxyCODONE 80 mg 12 hr tablet  Commonly known as:  OXYCONTIN     predniSONE 5 MG tablet  Commonly known as:  DELTASONE     rosuvastatin 10 MG tablet  Commonly known as:  CRESTOR     traMADol 50 MG tablet  Commonly known as:  ULTRAM      TAKE these medications        ADVAIR DISKUS 250-50 MCG/DOSE Aepb  Generic drug:  Fluticasone-Salmeterol  Inhale 1 puff into the lungs 2 (two) times daily.     aspirin 81 MG EC tablet  Take 1 tablet (81 mg total) by mouth daily.     atorvastatin 40 MG tablet  Commonly known as:  LIPITOR  Take 40 mg by mouth daily.     clopidogrel 75 MG tablet  Commonly known as:  PLAVIX  Take 75 mg by mouth daily.     feeding supplement (ENSURE ENLIVE) Liqd  Take 237 mLs by mouth 3 (three) times daily between meals.     fenofibrate 54 MG tablet  Take 1 tablet (54 mg total) by mouth daily.     ferrous sulfate 325 (65 FE) MG tablet  Take 1 tablet (325 mg total) by mouth 2 (two) times daily with a meal.     LYRICA 50 MG capsule  Generic drug:  pregabalin  Take 50 mg by mouth 2 (two) times daily.     pantoprazole 40 MG tablet  Commonly known as:  PROTONIX  Take 40 mg by mouth daily.          Diet and Activity  recommendation: See Discharge Instructions above   Consults obtained - Neurology Inpatient rehabilitation   Major procedures and Radiology Reports - PLEASE review detailed and final reports for all details, in brief -      Dg Chest 2 View  01/14/2015  CLINICAL DATA:  TIA symptoms, no acute chest complaints, history of COPD and CHF and coronary angioplasty. EXAM: CHEST  2 VIEW COMPARISON:  Chest x-ray of Jun 20, 2014 FINDINGS: The lungs are borderline hypoinflated which not a new finding. The cardiac silhouette remains enlarged. The pulmonary interstitial markings are minimally prominent but not significantly changed from the previous study. There is marked reverse S shaped thoracolumbar scoliosis. On the lateral film increased density projects over the mid to lower thoracic spine but this is stable IMPRESSION: Mild interstitial prominence of both lungs is partially due to borderline hypoinflation,but low-grade interstitial edema or other interstitial process may be present. Electronically Signed   By: David  Martinique M.D.   On: 01/14/2015 07:32   Ct Head Wo Contrast  01/16/2015  CLINICAL DATA:  64 year old female with right side weakness, lethargy and slurred speech. Inferior left thalamic infarct on 01/14/2015 brain MRI. Initial encounter. EXAM: CT HEAD WITHOUT CONTRAST TECHNIQUE: Contiguous axial images were obtained from the base of the skull through the vertex without intravenous contrast. COMPARISON:  Head CT 01/15/2015 and earlier. FINDINGS: Stable paranasal sinuses and mastoids. Stable visualized osseous structures. Stable orbit and scalp soft tissues. Hypodensity corresponding to the left  inferior thalamic round area of restricted diffusion on the recent MRI. This does appear to have mildly evolved since 01/13/2015. No associated hemorrhage or mass effect. Superimposed left basal ganglia heterogeneous hypodensity, patchy bilateral cerebral white matter hypodensity, and anterior left MCA  territory cortical encephalomalacia. Superimposed intracranial artery dolichoectasia and Calcified atherosclerosis at the skull base. No intracranial mass effect. No ventriculomegaly. IMPRESSION: 1. Expected evolution of the inferior left thalamic lacunar infarct with no hemorrhage or mass effect. 2. Underlying advanced chronic small and medium-sized vessel ischemia. Electronically Signed   By: Genevie Ann M.D.   On: 01/16/2015 11:46   Ct Head Wo Contrast  01/15/2015  CLINICAL DATA:  Sequela of cerebral infarction EXAM: CT HEAD WITHOUT CONTRAST TECHNIQUE: Contiguous axial images were obtained from the base of the skull through the vertex without intravenous contrast. COMPARISON:  Two days ago FINDINGS: The acute left subthalamic lacunar infarct shows no hemorrhagic conversion or evidence of growth. Stable chronic ischemic injury in the bilateral cerebral white matter, left putamen, and in the anterior left frontal cortex. No new site of infarct is identified. No hydrocephalus, shift, or mass lesion. Diffuse intracranial calcified atherosclerosis. No new orbital or osseous findings. Clear visualized paranasal sinuses and mastoids. IMPRESSION: Stable exam. No hemorrhagic conversion or evidence of infarct extension. Electronically Signed   By: Monte Fantasia M.D.   On: 01/15/2015 03:33   Ct Head Wo Contrast  01/13/2015  CLINICAL DATA:  Right-sided weakness. EXAM: CT HEAD WITHOUT CONTRAST TECHNIQUE: Contiguous axial images were obtained from the base of the skull through the vertex without intravenous contrast. COMPARISON:  02/07/2012 FINDINGS: There is encephalomalacia within the left frontal lobe compatible with chronic infarct. This is new since 02/07/2012. Patchy areas of low-attenuation are identified 2 areas of subcortical low attenuation within the posterior right parietal lobe are identified and appear unchanged from previous exam. Prominence of the sulci and ventricles noted compatible with mild brain  atrophy. No evidence for acute intracranial hemorrhage, mass or acute brain infarct. No abnormal extra-axial fluid collections identified. The paranasal sinuses and mastoid air cells are clear. The calvarium is intact. IMPRESSION: 1. No acute intracranial abnormalities. 2. Left frontal lobe encephalomalacia compatible with chronic infarct. New from previous exam. 3. Small vessel ischemic change, similar to previous exam. Electronically Signed   By: Kerby Moors M.D.   On: 01/13/2015 22:04   Dg Abd Portable 1v  01/16/2015  CLINICAL DATA:  Constipation and vomiting episodes this morning. EXAM: PORTABLE ABDOMEN - 1 VIEW COMPARISON:  CT 02/10/2014 and abdominal radiographs 06/20/2014 FINDINGS: Again noted is marked dextroscoliosis of the thoracolumbar spine. Few surgical clips scattered throughout the abdomen and pelvis. There is gas within the small and large bowel. Small amount amount of stool in the colon. Multiple pelvic calcifications. IMPRESSION: Nonobstructive bowel gas pattern Electronically Signed   By: Markus Daft M.D.   On: 01/16/2015 09:52   Mr Brain Ltd W/o Cm  01/14/2015  CLINICAL DATA:  Initial evaluation for acute exacerbation of right-sided weakness. EXAM: MRI HEAD WITHOUT CONTRAST TECHNIQUE: Multiplanar, multiecho pulse sequences of the brain and surrounding structures were obtained without intravenous contrast. COMPARISON:  Prior CT from 01/13/2015. FINDINGS: Study is limited as only axial diffusion and sagittal T1 weighted sequences were performed. Diffusion-weighted imaging demonstrates a 11 x 9 mm focus of restricted diffusion within the left thalamus extending towards the left cerebral peduncle (series 4, image 23). Additional small 11 mm curvilinear focus of restricted diffusion within knee adjacent posterior left internal capsule/ mesial  left temporal lobe. No associated mass effect. Additional tiny punctate focus of restricted diffusion at the level of the right lentiform nucleus  (series 4, image 26), also suspicious for a tiny acute infarct. No other infarct identified on this limited exam. Encephalomalacia within the left frontal lobe noted. No mass effect or midline shift. No hydrocephalus. No definite extra-axial fluid collection. Craniocervical junction within normal limits. Pituitary gland normal. Bone marrow signal intensity within normal limits. Scalp soft tissues are unremarkable. IMPRESSION: 1. Limited study with only diffusion and sagittal T1 weighted sequences performed. 2. 11 x 9 mm acute ischemic infarct centered at the inferior left thalamus/left cerebral peduncle, with adjacent 11 mm curvilinear infarct within the posterior limb of the left internal capsule/mesial left temporal lobe. No associated mass effect. 3. Additional punctate foci of diffusion abnormality within the right basal ganglia as above, also suspicious for tiny acute ischemic infarcts. 4. No other definite acute intracranial process on this limited study. Electronically Signed   By: Jeannine Boga M.D.   On: 01/14/2015 03:24    Micro Results     No results found for this or any previous visit (from the past 240 hour(s)).     Today   Subjective:   Michelle Holmes today has no headache,no chest or abdominal pain, reports she is tired from sitting on the chair.  Objective:   Blood pressure 122/70, pulse 55, temperature 97.9 F (36.6 C), temperature source Oral, resp. rate 18, height 5\' 5"  (1.651 m), weight 103.193 kg (227 lb 8 oz), SpO2 99 %.   Intake/Output Summary (Last 24 hours) at 01/18/15 1019 Last data filed at 01/17/15 2251  Gross per 24 hour  Intake      3 ml  Output      0 ml  Net      3 ml    Exam  General exam: Moderately built and obese pleasant middle-aged female sitting in chair in no apparent distress. Respiratory system: Clear. No increased work of breathing. Cardiovascular system: S1 & S2 heard, RRR. No JVD, murmurs, gallops, clicks or pedal edema. Telemetry:  SB 50's-SR. Gastrointestinal system: Abdomen is nondistended, soft and nontender. Normal bowel sounds heard. Central nervous system: Alert and oriented. No focal neurological deficits. Extremities: Symmetric 5 x 5 power in left limbs. Significant right-sided weakness.  Data Review   CBC w Diff: Lab Results  Component Value Date   WBC 9.9 01/16/2015   WBC 11.8* 07/13/2005   HGB 11.0* 01/16/2015   HGB 11.9 07/13/2005   HCT 33.1* 01/16/2015   HCT 34.8 07/13/2005   PLT 179 01/16/2015   PLT 223 07/13/2005   LYMPHOPCT 30 01/13/2015   LYMPHOPCT 61.2* 07/13/2005   BANDSPCT 0 12/12/2010   MONOPCT 6 01/13/2015   MONOPCT 5.6 07/13/2005   EOSPCT 2 01/13/2015   EOSPCT 2.5 07/13/2005   BASOPCT 0 01/13/2015   BASOPCT 0.3 07/13/2005    CMP: Lab Results  Component Value Date   NA 144 01/18/2015   K 4.9 01/18/2015   CL 115* 01/18/2015   CO2 22 01/18/2015   BUN 56* 01/18/2015   CREATININE 2.98* 01/18/2015   PROT 6.8 01/13/2015   ALBUMIN 3.5 01/13/2015   BILITOT 0.4 01/13/2015   ALKPHOS 80 01/13/2015   AST 13* 01/13/2015   ALT 15 01/13/2015  .   Total Time in preparing paper work, data evaluation and todays exam - 35 minutes  Dusty Wagoner M.D on 01/18/2015 at 10:19 AM  [facility]   Office  [phone]

## 2015-01-18 NOTE — Discharge Instructions (Signed)
Follow with Primary MD Charolette Forward, MD after discharge from [place], CMP,  checked  by Primary MD next visit.    Activity: As tolerated with Full fall precautions use walker/cane & assistance as needed   Disposition CIR   Diet: Heart Healthy , dysphagia 3 with thin liquids, with feeding assistance and aspiration precautions.  For Heart failure patients - Check your Weight same time everyday, if you gain over 2 pounds, or you develop in leg swelling, experience more shortness of breath or chest pain, call your Primary MD immediately. Follow Cardiac Low Salt Diet and 1.5 lit/day fluid restriction.   On your next visit with your primary care physician please Get Medicines reviewed and adjusted.   Please request your Prim.MD to go over all Hospital Tests and Procedure/Radiological results at the follow up, please get all Hospital records sent to your Prim MD by signing hospital release before you go home.   If you experience worsening of your admission symptoms, develop shortness of breath, life threatening emergency, suicidal or homicidal thoughts you must seek medical attention immediately by calling 911 or calling your MD immediately  if symptoms less severe.  You Must read complete instructions/literature along with all the possible adverse reactions/side effects for all the Medicines you take and that have been prescribed to you. Take any new Medicines after you have completely understood and accpet all the possible adverse reactions/side effects.   Do not drive, operating heavy machinery, perform activities at heights, swimming or participation in water activities or provide baby sitting services if your were admitted for syncope or siezures until you have seen by Primary MD or a Neurologist and advised to do so again.  Do not drive when taking Pain medications.    Do not take more than prescribed Pain, Sleep and Anxiety Medications  Special Instructions: If you have smoked  or chewed Tobacco  in the last 2 yrs please stop smoking, stop any regular Alcohol  and or any Recreational drug use.  Wear Seat belts while driving.   Please note  You were cared for by a hospitalist during your hospital stay. If you have any questions about your discharge medications or the care you received while you were in the hospital after you are discharged, you can call the unit and asked to speak with the hospitalist on call if the hospitalist that took care of you is not available. Once you are discharged, your primary care physician will handle any further medical issues. Please note that NO REFILLS for any discharge medications will be authorized once you are discharged, as it is imperative that you return to your primary care physician (or establish a relationship with a primary care physician if you do not have one) for your aftercare needs so that they can reassess your need for medications and monitor your lab values.

## 2015-01-18 NOTE — Progress Notes (Signed)
Patient ID: Michelle Holmes, female   DOB: 03-14-1950, 64 y.o.   MRN: [id number] Patient admitted to [phone] via wheelchair, escorted by nursing staff.  Patient unable to verbalize understanding of rehab process due to cognitive deficits.  Appears to be in no immediate distress at this time.  Brita Romp, RN

## 2015-01-18 NOTE — Progress Notes (Signed)
Michelle Diones, RN Rehab Admission Coordinator Signed Physical Medicine and Rehabilitation PMR Pre-admission 01/17/2015 2:09 PM  Related encounter: ED to Hosp-Admission (Current) from 01/13/2015 in [place] Collapse All   PMR Admission Coordinator Pre-Admission Assessment  Patient: Michelle Holmes is an 64 y.o., female MRN: [id number] DOB: 09/29/1950 Height: 5\' 5"  (165.1 cm) Weight: 103.193 kg (227 lb 8 oz)  Insurance Information HMO: yes PPO: PCP: IPA: 80/20: OTHER:  PRIMARY: UHC Medicare Policy#: [id number] Subscriber: self CM Name: Sherlynn Stalls Phone#: [phone] Fax#: [phone] Pre-Cert#: [id number] Employer: not employed  Benefits: Phone #: [phone] Name: automated  Eff. Date: 05/27/14 Deduct: 0 Out of Pocket Max: $6700.00 Life Max: none CIR: $430.00 days 1-4, $0 days 5+ SNF: $0 days 1-20, $160.00 days 21-62, $0 days 63-100 Outpatient: PT/OT/SLP Co-Pay: $40.00 Home Health: 100% Co-Pay: none DME: 80% Co-Pay: 20% Providers: in network   SECONDARY: Medicaid of  Policy#: [id number] Subscriber: self CM Name: Phone#: Fax#:  Pre-Cert#: Employer: not employed  Benefits: Phone #: [phone] Eff. Date: Eligible 01/17/15 Coverage code [id number] Deduct: Out of Pocket Max: Life Max:  CIR: SNF:  Outpatient: Co-Pay:  Home Health: Co-Pay:  DME: Co-Pay:   Medicaid Application Date: Case Manager:  Disability Application Date: Case Worker:   Emergency Contact Information Contact Information    Name Relation Home Work Mobile   Worthing,Denise Daughter   [phone]   Abdou,Quatina Daughter   [phone]      Current Medical History  Patient Admitting Diagnosis: Left subthalamic lacunar infarct   History of Present Illness: Michelle Holmes is a 64 y.o. right hand is female with history of COPD with remote tobacco abuse, systolic congestive heart failure, hypertension, diabetes mellitus peripheral neuropathy, chronic renal insufficiency with baseline creatinine 2.69, CAD with stenting maintained on aspirin and Plavix. History taken from chart review and patient. Patient lives with family and used a cane prior to admission. One level home. She has assistance as needed from her daughter 4/7. Presented 01/14/2015 with right-sided weakness, slurred speech and blood sugar 504. MRI of the brain showed acute ischemic infarct centered at the inferior left thalamus/left cerebral peduncle with additional punctate foci of diffusion abnormality within the right basal ganglia. Echocardiogram with ejection fraction of 123456 grade 1 diastolic dysfunction. Carotid Dopplers with no ICA stenosis. Patient did not receive TPA. Neurology consulted presently maintained on aspirin and Plavix for CVA prophylaxis. Subcutaneous Lovenox for DVT prophylaxis.Follow up CT of the head secondary to lethargy 01/15/2105 with expected evolution of the inferior left thalamic lacunar infarct. Patient refused MRI MRA. ABGs unremarkable. EEG without seizure. Lethargy felt possibly related to Xanax and OxyContin that have been discontinued and mental status has improved. Bouts of constipation with abdominal film showing nonobstructive bowel gas pattern and small amount of stool in the colon. Presently on a dysphagia 2 thin liquid diet.. Hemoglobin A1c 13.9 with insulin therapy as directed. Physical and occupational therapy evaluations completed 01/15/2015 with recommendations of physical medicine rehabilitation consult. Patient to be admitted for comprehensive inpatient rehabilitation program.  NIH Total: 15  Past Medical History  Past Medical History   Diagnosis Date  . Emphysema   . Colon cancer ([place])   . Hypertension   . Hypercholesteremia   . Diabetes mellitus   . COPD (chronic obstructive pulmonary disease) ([place])   . Coronary artery disease   . Obesity   . Sleep apnea   . TIA (transient ischemic attack)   . CHF (congestive  heart failure) ([place])   . Anemia   . DJD (degenerative joint disease)   . Renal insufficiency   . Chronic back pain   . Scoliosis   . Obesity hypoventilation syndrome ([place])   . Stroke ([place])   . Myocardial infarction [facility])     Family History  family history is not on file.  Prior Rehab/Hospitalizations:  Has the patient had major surgery during 100 days prior to admission? No  Current Medications   Current facility-administered medications:  . 0.9 % sodium chloride infusion, 250 mL, Intravenous, PRN, Reubin Milan, MD . aspirin EC tablet 81 mg, 81 mg, Oral, Daily, Reubin Milan, MD, 81 mg at 01/18/15 [id number] . atorvastatin (LIPITOR) tablet 40 mg, 40 mg, Oral, q1800, Reubin Milan, MD, 40 mg at 01/17/15 1709 . bisacodyl (DULCOLAX) suppository 10 mg, 10 mg, Rectal, Once, Albertine Patricia, MD, 10 mg at 01/16/15 0915 . clopidogrel (PLAVIX) tablet 75 mg, 75 mg, Oral, Daily, Reubin Milan, MD, 75 mg at 01/18/15 0932 . enoxaparin (LOVENOX) injection 40 mg, 40 mg, Subcutaneous, QHS, Reubin Milan, MD, 40 mg at 01/17/15 2250 . feeding supplement (ENSURE ENLIVE) (ENSURE ENLIVE) liquid 237 mL, 237 mL, Oral, BID BM, Albertine Patricia, MD, 237 mL at 01/18/15 0940 . fenofibrate tablet 54 mg, 54 mg, Oral, Daily, Modena Jansky, MD, 54 mg at 01/18/15 0931 . ferrous sulfate tablet 325 mg, 325 mg, Oral, BID WC, Reubin Milan, MD, 325 mg at 01/18/15 0900 . mometasone-formoterol (DULERA) 100-5 MCG/ACT inhaler 2 puff, 2 puff, Inhalation, BID, Reubin Milan, MD, 2 puff at 01/18/15 0825 . ondansetron (ZOFRAN)  injection 4 mg, 4 mg, Intravenous, Q6H PRN, Albertine Patricia, MD, 4 mg at 01/16/15 1834 . pantoprazole (PROTONIX) EC tablet 40 mg, 40 mg, Oral, Daily, Reubin Milan, MD, 40 mg at 01/18/15 0931 . pregabalin (LYRICA) capsule 50 mg, 50 mg, Oral, BID, Reubin Milan, MD, 50 mg at 01/18/15 0932 . sodium chloride 0.9 % injection 3 mL, 3 mL, Intravenous, Q12H, Reubin Milan, MD, 3 mL at 01/18/15 0941 . sodium chloride 0.9 % injection 3 mL, 3 mL, Intravenous, Q12H, Reubin Milan, MD, 3 mL at 01/18/15 [phone] . sodium chloride 0.9 % injection 3 mL, 3 mL, Intravenous, PRN, Reubin Milan, MD  Patients Current Diet: DIET DYS 3 Room service appropriate?: Yes; Fluid consistency:: Thin  Precautions / Restrictions Precautions Precautions: Fall Restrictions Weight Bearing Restrictions: No   Has the patient had 2 or more falls or a fall with injury in the past year?Yes, patient fell about 5 times at home prior to this admission   Prior Activity Level Community (5-7x/wk): Daughter [name] reports that her mother left the house daily with a family member. She enjoyed shopping prior to admission.   Home Assistive Devices / Equipment Home Assistive Devices/Equipment: Cane (specify quad or straight), CBG Meter Home Equipment: Cane - single point  Prior Device Use: Indicate devices/aids used by the patient prior to current illness, exacerbation or injury? Walker, rollator   Prior Functional Level Prior Function Level of Independence: Independent with assistive device(s) (Rollator walker ) Comments: Unsure of PLOF. No family present.   Self Care: Did the patient need help bathing, dressing, using the toilet or eating? Needed some help with bathing and dressing   Indoor Mobility: Did the patient need assistance with walking from room to room (with or without device)? Independent  Stairs: Did the patient need assistance with internal or external stairs (  with or without  device)? Needed some help  Functional Cognition: Did the patient need help planning regular tasks such as shopping or remembering to take medications? Independent  Current Functional Level Cognition  Arousal/Alertness: Awake/alert Overall Cognitive Status: Impaired/Different from baseline Difficult to assess due to: Level of arousal Current Attention Level: Sustained Orientation Level: Oriented to person, Oriented to place, Disoriented to time, Disoriented to situation Following Commands: Follows one step commands inconsistently Safety/Judgement: Decreased awareness of deficits General Comments: Unsure fo cognitive baseline. Pt became agitated intermittently throughout session with no obvious cause.  Attention: Sustained Sustained Attention: Appears intact Memory: Appears intact (recalled had a cva) Awareness: Appears intact Problem Solving: Impaired Problem Solving Impairment: Functional basic (to locate call bell in bed on her right side, verbal/tactile cues required) Behaviors: Restless Safety/Judgment: Appears intact (need to call for assistance due ot sensory and motor deficits) Comments: pt able to name 4 animals in 60 seconds   Extremity Assessment (includes Sensation/Coordination)  Upper Extremity Assessment: Defer to OT evaluation RUE Deficits / Details: Flaccid UE, 1/5 biceps RUE Sensation: decreased light touch, decreased proprioception RUE Coordination: decreased fine motor, decreased gross motor  Lower Extremity Assessment: RLE deficits/detail RLE Deficits / Details: knee extension 3-, ankle DF 2+ RLE Sensation: (difficult to assess due to decr communication) RLE Coordination: decreased gross motor    ADLs  Overall ADL's : Needs assistance/impaired Grooming: Wash/dry hands, Wash/dry face, Sitting, Maximal assistance (applying lotion to R UE) Toilet Transfer: Maximal assistance, +2 for physical assistance, Cueing for safety, Cueing for sequencing,  Stand-pivot, BSC Toileting- Clothing Manipulation and Hygiene: Maximal assistance, +2 for physical assistance, Cueing for safety, Cueing for sequencing, Sit to/from stand Functional mobility during ADLs: Maximal assistance, +2 for physical assistance, Cueing for safety, Cueing for sequencing General ADL Comments: Worked on locating ADL items on R side.     Mobility  Overal bed mobility: Needs Assistance Bed Mobility: Rolling, Sidelying to Sit Rolling: Mod assist Sidelying to sit: Max assist, HOB elevated General bed mobility comments: pt in chair    Transfers  Overall transfer level: Needs assistance Equipment used: 2 person hand held assist Transfers: Sit to/from Stand Sit to Stand: +2 physical assistance, Mod assist Stand pivot transfers: Max assist, +2 physical assistance General transfer comment: R knee blocked, assist to rise and for balance, performed x 1    Ambulation / Gait / Stairs / Wheelchair Mobility       Posture / Balance Dynamic Sitting Balance Sitting balance - Comments: at edge of chair Balance Overall balance assessment: Needs assistance Sitting-balance support: Single extremity supported Sitting balance-Leahy Scale: Fair Sitting balance - Comments: at edge of chair Postural control: Posterior lean (may be due to body habitus) Standing balance support: Single extremity supported, During functional activity Standing balance-Leahy Scale: Zero Standing balance comment: Rt knee buckles    Special needs/care consideration BiPAP/CPAP none CPM none Continuous Drip IV D5 1/2 NS 100 mL/hr Dialysis none  Life Vest Oxygen 2L O2 [place] at home  Special Bed none Trach Size none Wound Vac (area) none  Skin dry  Bowel mgmt: 01/17/15 Bladder mgmt: inconsistent at times at baseline with depends worn daily  Diabetic mgmt HgbA1c 13.9 goal < 7.0    Previous Home Environment Living Arrangements: Other relatives Media planner and Film/video editor) Lives  With: Family Available Help at Discharge: Family, Available 24 hours/day Type of Home: House Home Layout: One level Home Access: Level entry Bathroom Shower/Tub: Gaffer, Door Home Care Services: No Additional Comments: Unsure of accuracy  of pt's responses. Pt became angry and refused to answer any more questions.  Discharge Living Setting Plans for Discharge Living Setting: Patient's home, House Type of Home at Discharge: House Discharge Home Layout: One level Discharge Home Access: Level entry Discharge Bathroom Shower/Tub: Tub/shower unit Discharge Bathroom Toilet: Standard (daughter reports ) Discharge Bathroom Accessibility: Yes How Accessible: Accessible via walker Does the patient have any problems obtaining your medications?: No  Social/Family/Support Systems Patient Roles: Parent Anticipated Caregiver: Daughters: Ottilia Winand [phone] Toni Amend [phone] Ability/Limitations of Caregiver: [name] reports that family will provide 24/7 assist as they did before Caregiver Availability: 24/7 Discharge Plan Discussed with Primary Caregiver: Yes Is Caregiver In Agreement with Plan?: Yes Does Caregiver/Family have Issues with Lodging/Transportation while Pt is in Rehab?: No  Goals/Additional Needs Patient/Family Goal for Rehab: PT/OT/SLP Supervision-Min assist  Expected length of stay: 20-24 days Cultural Considerations: none Dietary Needs: Dys.2 textures with thin liquids; heart healthy and carb modified restrictions  Equipment Needs: TBD Special Service Needs: none Pt/Family Agrees to Admission and willing to participate: Yes Program Orientation Provided & Reviewed with Pt/Caregiver Including Roles & Responsibilities: Yes Additional Information Needs: Daughter was asking about a getting PCS from [place], which she had up until 8 months ago Information Needs to be Provided By: CSW  Decrease burden of Care through IP rehab admission: not anticipated    Possible need for SNF placement upon discharge: not anticipated   Patient Condition: This patient's medical and functional status has changed since the consult dated: 01/15/15 in which the Rehabilitation Physician determined and documented that the patient's condition is appropriate for intensive rehabilitative care in an inpatient rehabilitation facility. See "History of Present Illness" (above) for medical update. Functional changes are: Currently requiring max assist for pivot transfers. Patient's medical and functional status update has been discussed with the Rehabilitation physician and patient remains appropriate for inpatient rehabilitation. Will admit to inpatient rehab today.  Preadmission Screen Completed By: Michelle Holmes, 01/18/2015 10:20 AM ______________________________________________________________________  Discussed status with Dr. Posey Pronto on 01/18/15 at 20 and received telephone approval for admission today.  Admission Coordinator: Michelle Holmes, time1024/Date12/23/16          Cosigned by: Ankit [name], MD at 01/18/2015 11:06 AM  Revision History     Date/Time User Provider Type Action   01/18/2015 11:06 AM Ankit [name], MD Physician Cosign   01/18/2015 10:24 AM Michelle Diones, RN Rehab Admission Coordinator Sign   01/17/2015 2:43 PM Gunnar Fusi Rehab Admission Coordinator Share   View Details Report

## 2015-01-18 NOTE — Care Management Note (Signed)
Case Management Note  Patient Details  Name: Michelle Holmes MRN: [id number] Date of Birth: 06-Nov-1950  Subjective/Objective:                    Action/Plan: Patient being discharged to CIR today. No further needs per CM.   Expected Discharge Date:                  Expected Discharge Plan:     In-House Referral:     Discharge planning Services     Post Acute Care Choice:    Choice offered to:     DME Arranged:    DME Agency:     HH Arranged:    [place] Agency:     Status of Service:     Medicare Important Message Given:  Yes Date Medicare IM Given:    Medicare IM give by:    Date Additional Medicare IM Given:    Additional Medicare Important Message give by:     If discussed at [place] of Stay Meetings, dates discussed:    Additional Comments:  Pollie Friar, RN 01/18/2015, 1:12 PM

## 2015-01-18 NOTE — Progress Notes (Signed)
STROKE TEAM PROGRESS NOTE   HISTORY Michelle Holmes is an 64 y.o. female with history of diabetes mellitus, hypertension, hypercholesterolemia, COPD, previous stroke, and coronary artery disease, presenting with exacerbation of weakness involving her right side starting at 10:30 PM last night. Patient has also had abated blood sugar, including a level of 504 in the emergency room today. His been taking aspirin and Plavix daily. CT scan of her head acute changes. Frontal encephalomalacia from previous stroke was noted. NIH stroke score was 7.  LSN: 10:30 PM on 01/12/2015 tPA Given: No: Beyond time under for treatment consideration mRankin:   SUBJECTIVE (INTERVAL HISTORY) Patient had worsening of right hemiplegia  01/16/15 with depressed sensorium. Stat CT scan of the head showed no acute abnormality. She seems to be improving somewhat now and is able to move right leg.  Vitals signs have been stable .  OBJECTIVE Temp:  [97.5 F (36.4 C)-98.1 F (36.7 C)] 98.1 F (36.7 C) (12/23 1316) Pulse Rate:  [51-61] 54 (12/23 1316) Cardiac Rhythm:  [-] Sinus bradycardia (12/23 0846) Resp:  [16-20] 20 (12/23 1316) BP: (112-126)/(63-76) 114/65 mmHg (12/23 1316) SpO2:  [97 %-100 %] 100 % (12/23 1316)  CBC:   Recent Labs Lab 01/13/15 2133 01/16/15 0644  WBC 7.4 9.9  NEUTROABS 4.5  --   HGB 10.2* 11.0*  HCT 30.3* 33.1*  MCV 93.2 95.1  PLT 167 0000000    Basic Metabolic Panel:   Recent Labs Lab 01/17/15 0410 01/18/15 0441  NA 143 144  K 4.6 4.9  CL 116* 115*  CO2 19* 22  GLUCOSE 74 100*  BUN 61* 56*  CREATININE 3.03* 2.98*  CALCIUM 8.8* 9.1  MG  --  2.0  PHOS  --  5.7*    Lipid Panel:     Component Value Date/Time   CHOL 224* 01/15/2015 0534   TRIG 579* 01/15/2015 0534   HDL 22* 01/15/2015 0534   CHOLHDL 10.2 01/15/2015 0534   VLDL UNABLE TO CALCULATE IF TRIGLYCERIDE OVER 400 mg/dL 01/15/2015 0534   LDLCALC UNABLE TO CALCULATE IF TRIGLYCERIDE OVER 400 mg/dL 01/15/2015 0534    HgbA1c:  Lab Results  Component Value Date   HGBA1C 13.9* 01/15/2015   Urine Drug Screen:     Component Value Date/Time   LABOPIA NONE DETECTED 01/13/2015 2238   COCAINSCRNUR NONE DETECTED 01/13/2015 2238   LABBENZ NONE DETECTED 01/13/2015 2238   AMPHETMU NONE DETECTED 01/13/2015 2238   THCU NONE DETECTED 01/13/2015 2238   LABBARB NONE DETECTED 01/13/2015 2238      IMAGING  Dg Chest 2 View 01/14/2015   Mild interstitial prominence of both lungs is partially due to borderline hypoinflation,but low-grade interstitial edema or other interstitial process may be present.  Ct Head Wo Contrast 01/13/2015   1. No acute intracranial abnormalities.  2. Left frontal lobe encephalomalacia compatible with chronic infarct. New from previous exam.  3. Small vessel ischemic change, similar to previous exam.    Mr Brain Ltd W/o Cm  01/14/2015   1. Limited study with only diffusion and sagittal T1 weighted sequences performed.  2. 11 x 9 mm acute ischemic infarct centered at the inferior left thalamus/left cerebral peduncle, with adjacent 11 mm curvilinear infarct within the posterior limb of the left internal capsule/mesial left temporal lobe. No associated mass effect.  3. Additional punctate foci of diffusion abnormality within the right basal ganglia as above, also suspicious for tiny acute ischemic infarcts.  4. No other definite acute intracranial process on this limited  study.        PHYSICAL EXAM Pleasant middle-aged lady not in distress. . Afebrile. Head is nontraumatic. Neck is supple without bruit.    Cardiac exam no murmur or gallop. Lungs are clear to auscultation. Distal pulses are well felt. Neurological Exam :  drowsy but can be aroused Mild dysarthria but no aphasia. Follows commands well. Extraocular moments are full range without nystagmus. Fundi were not visualized. Vision acuity seems adequate. Mild right lower facial weakness. Tongue midline. Motor system exam  revealed severe right hemiplegia   RUE 0/5.RLE 2/5 strength Tone is increased on the right side. Deep tendon reflexes are brisker on the right compared to the left. Right plantar is upgoing left is downgoing. Sensation is diminished in the right hemibody. Gait was not tested. Coordination is slightly impaired on the right compared to the left. ASSESSMENT/PLAN [name] is a 64 y.o. female with history of diabetes mellitus, hypertension, hypercholesterolemia, COPD, previous stroke, and coronary artery disease presenting with exacerbation of weakness involving her right side . She did not receive IV t-PA due to late presentation.  Stroke:  Dominant left brain subcortical infarcts    from small vessel disease  Resultant  right hemiplegia -fluctuating exam ? Capsular warning syndrome  MRI  as noted above  MRA  not performed TCD's - Normal mean flow velocities in identified vessels of anterior and posterior cerebral circulations.Carotids siphons not studied due to technical difficulties. Globally elevated pulsatility indices  suggest diffuse intracranial atherosclerosisCarotid Doppler - No significant plaque or stenosis at either carotid bifurcations. Bilateral vertebral artery flow is  antegrade.  2D Echo  EF 55-60%. No cardiac source of emboli identified.  LDL unable to calculate secondary to triglycerides of 712  HgbA1c 13.9  VTE prophylaxis - not DIET DYS 3 Room service appropriate?: Yes; Fluid consistency:: Thin  aspirin 81 mg daily and clopidogrel 75 mg daily prior to admission, now on aspirin 81 mg daily and clopidogrel 75 mg daily  Patient counseled to be compliant with her antithrombotic medications  Ongoing aggressive stroke risk factor management  Therapy recommendations: CLR  Disposition: Pending  Hypertension  Stable Permissive hypertension (OK if < 220/120) but gradually normalize in 5-7 days  Hyperlipidemia  Home meds:  Lipitor 40 mg daily resumed in  hospital. Also on Lopid 600 mg twice a day prior to admission.  LDL could not be calculated, goal < 70  Continue Lipitor 40 mg daily since patient is also on Lopid - confirm compliance with medications.  Continue statin at discharge  Diabetes  HgbA1c 13.9 goal < 7.0  Uncontrolled  Other Stroke Risk Factors  Advanced age  Cigarette smoker, quit smoking   Obesity, Body mass index is 37.86 kg/(m^2).   Hx stroke/TIA  Coronary artery disease   Other Active Problems  Anemia  Renal insufficiency   Hospital day # 5     I have personally examined this patient, reviewed notes, independently viewed imaging studies, participated in medical decision making and plan of care. I have made any additions or clarifications directly to the above note.   She presented with worsening of previous right-sided weakness secondary to new left brain subcortical infarct etiology likely small vessel disease. She has had a fluctuating right hemiplegia in the setting of initially severe had hyperglycemia and then hypoglycemia. I wonder if this may fit capsular warning syndrome   Patient  refused MRI  And EEG . Transfer to inpatient rehabilitation today if bed available. Follow-up as an outpatient  in the stroke clinic in 2 months. Stroke team will sign off. Kindly call for questions. Antony Contras, MD Medical Director [facility] Pager: [phone] 01/18/2015 2:24 PM     To contact Stroke Continuity provider, please refer to [email or web address] After hours, contact General Neurology

## 2015-01-18 NOTE — H&P (View-Only) (Signed)
Physical Medicine and Rehabilitation Admission H&P    Chief Complaint  Patient presents with  . 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 MRI brain done revealing acute infarct left thalamus and left cerebral peduncle adjacent to limb of left internal capsul and tiny acute ischemic infarct right basal ganglia. Carotid dopplers without significant abnormality. 2D echo with EF 55-60% with no wall abnormality.  She has had worsening of symptoms with lethargy on  12/20 but refused repeat MRI.  Dr. Leonie Man felt that patient had left subcortical infarcts due to small vessel disease with fluctuating right hemiplegia ? Capsular warning syndrome. She is to continue ASA/Plavix for secondary stroke prevention as well as better control of risk factors. Patient currently with worsening of dysarthria and right sided weakness, dysphagia and fluctuating mental status affecting mobility, safety with diet and ability to carry out ADL tasks. Has had improvement in mentation today and diet advanced to dysphagia 3, thin liquids. CIR recommended by MD and rehab team for follow up therapy.     Review of Systems  HENT: Negative for hearing loss.   Eyes: Negative for blurred vision and double vision.  Respiratory: Negative for cough and shortness of breath.   Cardiovascular: Negative for chest pain.  Gastrointestinal: Positive for constipation. Negative for heartburn and abdominal pain.  Genitourinary: Negative for dysuria and urgency.  Skin: Negative for rash.  Neurological: Positive for speech change and focal weakness. Negative for dizziness and headaches.  Psychiatric/Behavioral: Positive for memory loss.  All other systems reviewed and are negative.      Past Medical History  Diagnosis Date  . Emphysema   . Colon cancer ([place])   . Hypertension   . Hypercholesteremia   . Diabetes mellitus   . COPD (chronic obstructive pulmonary disease) ([place])   . Coronary artery disease   . Obesity   . Sleep apnea   . TIA (transient ischemic attack)   . CHF (congestive heart failure) ([place])   . Anemia   . DJD (degenerative joint disease)   . Renal insufficiency   . Chronic back pain   . Scoliosis   . Obesity hypoventilation syndrome ([place])   . Stroke ([place])   . Myocardial infarction [facility])     Past Surgical History  Procedure Laterality Date  . Coronary angioplasty with stent placement    . Colon surgery      History reviewed. No pertinent family history.    Social History:  reports that she has quit smoking. She does not have any smokeless tobacco history on file. She reports that she does not drink alcohol or use illicit drugs.     Allergies  Allergen Reactions  . Penicillins Hives and Swelling    Has patient had a PCN reaction causing immediate rash, facial/tongue/throat swelling, SOB or lightheadedness with hypotension: yes Has patient had a PCN reaction causing severe rash involving mucus membranes or skin necrosis: no Has patient had a PCN reaction that required hospitalization no Has patient had a PCN reaction occurring within the last 10 years: no If all of the above answers are "NO", then may proceed with Cephalosporin use.     Medications Prior to Admission  Medication Sig Dispense Refill  . ADVAIR DISKUS 250-50 MCG/DOSE AEPB  Inhale 1 puff into the lungs 2 (two) times daily.     . alprazolam (XANAX) 2 MG tablet Take 4 mg by mouth at bedtime.    . amLODipine (NORVASC) 10 MG tablet Take 10 mg by mouth daily.     . aspirin EC 81 MG EC tablet Take 1 tablet (81 mg total) by mouth daily. 30 tablet 3  . atorvastatin (LIPITOR) 40 MG tablet Take 40 mg by mouth daily.  3  . clopidogrel (PLAVIX) 75 MG tablet Take 75 mg by mouth  daily.  3  . ferrous sulfate 325 (65 FE) MG tablet Take 1 tablet (325 mg total) by mouth 2 (two) times daily with a meal. 60 tablet 3  . gemfibrozil (LOPID) 600 MG tablet Take 600 mg by mouth 2 (two) times daily before a meal.    . insulin glargine (LANTUS) 100 UNIT/ML injection Inject 0.6 mLs (60 Units total) into the skin 2 (two) times daily. 10 mL 11  . losartan (COZAAR) 100 MG tablet Take 100 mg by mouth daily.     . LYRICA 50 MG capsule Take 50 mg by mouth 2 (two) times daily.  3  . metoprolol succinate (TOPROL-XL) 100 MG 24 hr tablet Take 100 mg by mouth daily. Take with or immediately following a meal.    . ondansetron (ZOFRAN) 4 MG tablet Take 1 tablet (4 mg total) by mouth every 6 (six) hours. (Patient taking differently: Take 4 mg by mouth every 8 (eight) hours as needed for nausea. ) 12 tablet 0  . OxyCODONE (OXYCONTIN) 80 mg T12A 12 hr tablet Take 80 mg by mouth 2 (two) times daily as needed (pain).   0  . pantoprazole (PROTONIX) 40 MG tablet Take 40 mg by mouth daily.  3  . predniSONE (DELTASONE) 5 MG tablet Tapering dose for 12 days 12-14 to 12-26  6 for 2 days, 5 for 2 days, 4 for 2 days, 3 for 2 days, 2 for 2 days, 1 for 2 days  0  . rosuvastatin (CRESTOR) 10 MG tablet Take 10 mg by mouth daily.     . traMADol (ULTRAM) 50 MG tablet Take 50 mg by mouth every 4 (four) hours as needed for moderate pain.   0    Home: Home Living Family/patient expects to be discharged to:: Private residence Living Arrangements: Other relatives (Godson and goddaughter) Available Help at Discharge: Family, Available 24 hours/day Type of Home: House Home Access: Level entry Home Layout: One level Bathroom Shower/Tub: Walk-in shower, Door Home Equipment: Cane - single point Additional Comments: Unsure of accuracy of pt's responses. Pt became angry and refused to answer any more questions.  Lives With: Family   Functional History: Prior Function Level of Independence: Independent with assistive  device(s) (Rollator walker ) Comments: Unsure of PLOF. No family present.   Functional Status:  Mobility: Bed Mobility Overal bed mobility: Needs Assistance Bed Mobility: Rolling, Sidelying to Sit Rolling: Mod assist Sidelying to sit: Max assist, HOB elevated General bed mobility comments: pt in chair Transfers Overall transfer level: Needs assistance Equipment used: 2 person hand held assist Transfers: Sit to/from Stand Sit to Stand: +2 physical assistance, Mod assist Stand pivot transfers: Max assist, +2 physical assistance General transfer comment: R knee blocked, assist to rise and for balance, performed x 1      ADL: ADL Overall ADL's : Needs assistance/impaired Grooming: Wash/dry hands, Wash/dry face, Sitting, Maximal assistance (applying lotion to R UE) Toilet Transfer: Maximal assistance, +2 for   physical assistance, Cueing for safety, Cueing for sequencing, Stand-pivot, BSC Toileting- Clothing Manipulation and Hygiene: Maximal assistance, +2 for physical assistance, Cueing for safety, Cueing for sequencing, Sit to/from stand Functional mobility during ADLs: Maximal assistance, +2 for physical assistance, Cueing for safety, Cueing for sequencing General ADL Comments: Worked on locating ADL items on R side.   Cognition: Cognition Overall Cognitive Status: Impaired/Different from baseline Arousal/Alertness: Awake/alert Orientation Level: Oriented to person, Oriented to place, Disoriented to time, Disoriented to situation Attention: Sustained Sustained Attention: Appears intact Memory: Appears intact (recalled had a cva) Awareness: Appears intact Problem Solving: Impaired Problem Solving Impairment: Functional basic (to locate call bell in bed on her right side, verbal/tactile cues required) Behaviors: Restless Safety/Judgment: Appears intact (need to call for assistance due ot sensory and motor deficits) Comments: pt able to name 4 animals in 60  seconds Cognition Arousal/Alertness: Lethargic Behavior During Therapy: Flat affect Overall Cognitive Status: Impaired/Different from baseline Area of Impairment: Awareness, Safety/judgement, Problem solving Orientation Level:  (ox4) Current Attention Level: Sustained Memory: Decreased recall of precautions, Decreased short-term memory Following Commands: Follows one step commands inconsistently Safety/Judgement: Decreased awareness of deficits Awareness: Intellectual Problem Solving: Slow processing, Decreased initiation, Difficulty sequencing, Requires verbal cues, Requires tactile cues General Comments: Unsure fo cognitive baseline. Pt became agitated intermittently throughout session with no obvious cause.  Difficult to assess due to: Level of arousal   Blood pressure 122/70, pulse 55, temperature 97.9 F (36.6 C), temperature source Oral, resp. rate 18, height 5' 5" (1.651 m), weight 103.193 kg (227 lb 8 oz), SpO2 99 %. Physical Exam  Nursing note and vitals reviewed. Constitutional: She is oriented to person, place, and time. She appears well-developed and well-nourished.  HENT:  Head: Normocephalic and atraumatic.  Eyes: Conjunctivae and EOM are normal. Pupils are equal, round, and reactive to light.  Neck: Normal range of motion. Neck supple.  Cardiovascular: Normal rate and regular rhythm.   Respiratory: Effort normal and breath sounds normal. No respiratory distress. She has no wheezes.  GI: Soft. Bowel sounds are normal. There is no tenderness.  Musculoskeletal: She exhibits no edema or tenderness.  Neurological: She is alert and oriented to person, place, and time.  Has poor postural awareness.  Right facial weakness with dysarthric speech and occasional pooling of oral secretions.  Easily distracted needing redirection and was belligerent at times.  Able to follow simple one and occasional two step motor commands.  Exam limited by poor participation.   Motor: LUE: 4/5  proximal to distal LLE: 4/5 proximal to distal  RLE: 2/5 hip flexion, 4-/5 ankle dorsi/plantar flexion  RUE: 0/5  Sensation appears to be intact to light touch Right facial weakness Right tongue deviation  Skin: Skin is warm and dry. No erythema.  Psychiatric: Her affect is blunt. Her speech is delayed. She is slowed. Cognition and memory are impaired. She expresses inappropriate judgment.    Results for orders placed or performed during the hospital encounter of 01/13/15 (from the past 48 hour(s))  Glucose, capillary     Status: Abnormal   Collection Time: 01/16/15  4:45 PM  Result Value Ref Range   Glucose-Capillary 106 (H) 65 - 99 mg/dL  Glucose, capillary     Status: Abnormal   Collection Time: 01/16/15  9:31 PM  Result Value Ref Range   Glucose-Capillary 112 (H) 65 - 99 mg/dL   Comment 1 Notify RN    Comment 2 Document in Chart   Basic metabolic panel     Status:  Abnormal   Collection Time: 01/17/15  4:10 AM  Result Value Ref Range   Sodium 143 135 - 145 mmol/L   Potassium 4.6 3.5 - 5.1 mmol/L   Chloride 116 (H) 101 - 111 mmol/L   CO2 19 (L) 22 - 32 mmol/L   Glucose, Bld 74 65 - 99 mg/dL   BUN 61 (H) 6 - 20 mg/dL   Creatinine, Ser 3.03 (H) 0.44 - 1.00 mg/dL   Calcium 8.8 (L) 8.9 - 10.3 mg/dL   GFR calc non Af Amer 15 (L) >60 mL/min   GFR calc Af Amer 18 (L) >60 mL/min    Comment: (NOTE) The eGFR has been calculated using the CKD EPI equation. This calculation has not been validated in all clinical situations. eGFR's persistently <60 mL/min signify possible Chronic Kidney Disease.    Anion gap 8 5 - 15  Glucose, capillary     Status: Abnormal   Collection Time: 01/17/15  6:34 AM  Result Value Ref Range   Glucose-Capillary 61 (L) 65 - 99 mg/dL   Comment 1 Notify RN    Comment 2 Document in Chart   Glucose, capillary     Status: None   Collection Time: 01/17/15  6:56 AM  Result Value Ref Range   Glucose-Capillary 69 65 - 99 mg/dL   Comment 1 Notify RN    Comment  2 Document in Chart   Glucose, capillary     Status: None   Collection Time: 01/17/15  7:12 AM  Result Value Ref Range   Glucose-Capillary 66 65 - 99 mg/dL   Comment 1 Notify RN    Comment 2 Document in Chart   Glucose, capillary     Status: Abnormal   Collection Time: 01/17/15  7:29 AM  Result Value Ref Range   Glucose-Capillary 156 (H) 65 - 99 mg/dL  Glucose, capillary     Status: Abnormal   Collection Time: 01/17/15 11:23 AM  Result Value Ref Range   Glucose-Capillary 135 (H) 65 - 99 mg/dL   Comment 1 Notify RN    Comment 2 Document in Chart   Glucose, capillary     Status: Abnormal   Collection Time: 01/17/15  5:02 PM  Result Value Ref Range   Glucose-Capillary 140 (H) 65 - 99 mg/dL   Comment 1 Notify RN    Comment 2 Document in Chart   Glucose, capillary     Status: None   Collection Time: 01/17/15 10:15 PM  Result Value Ref Range   Glucose-Capillary 66 65 - 99 mg/dL   Comment 1 Notify RN    Comment 2 Document in Chart   Glucose, capillary     Status: None   Collection Time: 01/18/15 12:16 AM  Result Value Ref Range   Glucose-Capillary 67 65 - 99 mg/dL   Comment 1 Notify RN    Comment 2 Document in Chart   Glucose, capillary     Status: None   Collection Time: 01/18/15  1:40 AM  Result Value Ref Range   Glucose-Capillary 93 65 - 99 mg/dL   Comment 1 Notify RN    Comment 2 Document in Chart   Basic metabolic panel     Status: Abnormal   Collection Time: 01/18/15  4:41 AM  Result Value Ref Range   Sodium 144 135 - 145 mmol/L   Potassium 4.9 3.5 - 5.1 mmol/L   Chloride 115 (H) 101 - 111 mmol/L   CO2 22 22 - 32 mmol/L   Glucose,   Bld 100 (H) 65 - 99 mg/dL   BUN 56 (H) 6 - 20 mg/dL   Creatinine, Ser 2.98 (H) 0.44 - 1.00 mg/dL   Calcium 9.1 8.9 - 10.3 mg/dL   GFR calc non Af Amer 16 (L) >60 mL/min   GFR calc Af Amer 18 (L) >60 mL/min    Comment: (NOTE) The eGFR has been calculated using the CKD EPI equation. This calculation has not been validated in all clinical  situations. eGFR's persistently <60 mL/min signify possible Chronic Kidney Disease.    Anion gap 7 5 - 15  Magnesium     Status: None   Collection Time: 01/18/15  4:41 AM  Result Value Ref Range   Magnesium 2.0 1.7 - 2.4 mg/dL  Phosphorus     Status: Abnormal   Collection Time: 01/18/15  4:41 AM  Result Value Ref Range   Phosphorus 5.7 (H) 2.5 - 4.6 mg/dL  Glucose, capillary     Status: None   Collection Time: 01/18/15  6:58 AM  Result Value Ref Range   Glucose-Capillary 89 65 - 99 mg/dL   Comment 1 Notify RN    Comment 2 Document in Chart   Glucose, capillary     Status: Abnormal   Collection Time: 01/18/15 11:29 AM  Result Value Ref Range   Glucose-Capillary 111 (H) 65 - 99 mg/dL   No results found.     Medical Problem List and Plan: 1.  Dysphagia, Aphasia, hemiparesis secondary to Left subthalamic lacunar infarct 2.  DVT Prophylaxis/Anticoagulation: Pharmaceutical: Lovenox 3. Chronic back pain/ Pain Management: On lyrica bid.  Off OxyContin and ultram at this time. Will use  Tylenol prn to prevent sedation.  4. Mood: LCSW to follow for evaluation and support.  5. Neuropsych: This patient is  not capable of making decisions on her own behalf. 6. Skin/Wound Care: Maintain adequate nutrition and hydration status. Routine pressure relief measures.  7. Fluids/Electrolytes/Nutrition: Monitor I/O. Check lytes in am. Offer supplements if intake poor. Will add protein supplement.  8. COPD: Continue IS. On dulera bid.  9. OSA/OHS: Question use of CPAP at bedtime 10. Dyslipidemia: Continue Lipitor and fenofibrate. 11.  DM type 2: Monitor BS ac/hs.  Lantus on hold due to hypoglycemic episodes.  Use SSI for elevated BS --intake should improve as bouts of lethargy resolve.   12. HTN: Monitor TID and slowly resume home medications. Avoid hypotension to allow for adequate perfusion.   13. CKD stage 4: Baseline creatinine at 2.7 per records review--was as high as 3.2-3.5 in  May and 2.7  in 08/2014.  Continue to  Monitor with serial checks. Encourage po intake.      Post Admission Physician Evaluation: 1. Functional deficits secondary  to Left subthalamic lacunar infarct. 2. Patient is admitted to receive collaborative, interdisciplinary care between the physiatrist, rehab nursing staff, and therapy team. 3. Patient's level of medical complexity and substantial therapy needs in context of that medical necessity cannot be provided at a lesser intensity of care such as a SNF. 4. Patient has experienced substantial functional loss from his/her baseline which was documented above under the "Functional History" and "Functional Status" headings.  Judging by the patient's diagnosis, physical exam, and functional history, the patient has potential for functional progress which will result in measurable gains while on inpatient rehab.  These gains will be of substantial and practical use upon discharge  in facilitating mobility and self-care at the household level. 5. Physiatrist will provide 24 hour management of medical  needs as well as oversight of the therapy plan/treatment and provide guidance as appropriate regarding the interaction of the two. 6. 24 hour rehab nursing will assist with bladder management, safety, disease management, medication administration and patient education and help integrate therapy concepts, techniques,education, etc. 7. PT will assess and treat for/with: Lower extremity strength, range of motion, stamina, balance, functional mobility, safety, adaptive techniques and equipment, woundcare, coping skills, pain control, education.   Goals are: Min A. 8. OT will assess and treat for/with: ADL's, functional mobility, safety, upper extremity strength, adaptive techniques and equipment, wound mgt, ego support, and community reintegration.   Goals are: min A. Therapy may proceed with showering this patient. 9. SLP will assess and treat for/with: speech, language,  swallowing, higher level cognition.  Goals are: Min A/Supervision. 10. Case Management and Social Worker will assess and treat for psychological issues and discharge planning. 11. Team conference will be held weekly to assess progress toward goals and to determine barriers to discharge. 12. Patient will receive at least 3 hours of therapy per day at least 5 days per week. 13. ELOS: 20-24 days.       14. Prognosis:  good     Ankit Patel, MD 01/18/2015 

## 2015-01-18 NOTE — Progress Notes (Signed)
Discharged to 4w via wheelchair with NA Estill Bamberg and nurse

## 2015-01-18 NOTE — Progress Notes (Signed)
Occupational Therapy Treatment Patient Details Name: Michelle Holmes MRN: [id number] DOB: Jun 15, 1950 Today's Date: 01/18/2015    History of present illness 64 y.o. female admitted for Rt sided weakness; MRI + Lt thalamus and internal capsule infarcts (?Rt basal ganglia). NIH progressed from 7 on admission to 13. Head CT showed no acute changes  PMHx-CVA, DM, CHF, CAD, colon Ca, emphysema   OT comments  Pt much more alert and participating. Focus of session on self feeding with L hand and seated grooming.  Pt requiring moderate assistance.  Follow Up Recommendations  CIR    Equipment Recommendations       Recommendations for Other Services      Precautions / Restrictions Precautions Precautions: Fall       Mobility Bed Mobility                  Transfers                      Balance                                   ADL Overall ADL's : Needs assistance/impaired Eating/Feeding: Moderate assistance;Sitting Eating/Feeding Details (indicate cue type and reason): assist to spear food, able to bring to mouth with L hand, repeatedly trying to finger feed instead of using fork Grooming: Wash/dry hands;Wash/dry face;Sitting;Moderate assistance Grooming Details (indicate cue type and reason): assist for pt to locate her R UE to wash it and apply lotion                               General ADL Comments: Pt demonstrating ability to answer and hang up house phone appropriately.      Vision                 Additional Comments: appears to have depth perception deficits when attempting to self feed   Perception     Praxis      Cognition   Behavior During Therapy: [facility] for tasks assessed/performed Overall Cognitive Status: Impaired/Different from baseline Area of Impairment: Awareness;Safety/judgement;Problem solving;Orientation;Memory Orientation Level: Time;Situation (knows Christmas is soon) Current Attention Level:  Sustained Memory: Decreased recall of precautions;Decreased short-term memory    Safety/Judgement: Decreased awareness of deficits;Decreased awareness of safety   Problem Solving: Slow processing;Decreased initiation;Difficulty sequencing;Requires verbal cues;Requires tactile cues General Comments: Pt joking and cooperative today.    Extremity/Trunk Assessment               Exercises     Shoulder Instructions       General Comments      Pertinent Vitals/ Pain       Pain Assessment: No/denies pain  Home Living                                          Prior Functioning/Environment              Frequency Min 3X/week     Progress Toward Goals  OT Goals(current goals can now be found in the care plan section)  Progress towards OT goals: Progressing toward goals  Acute Rehab OT Goals Patient Stated Goal: agreeable to rehab  Plan Discharge plan remains appropriate    Co-evaluation  End of Session     Activity Tolerance Patient tolerated treatment well   Patient Left in chair;with call bell/phone within reach;with chair alarm set   Nurse Communication          Time: [id number] OT Time Calculation (min): 34 min  Charges: OT General Charges $OT Visit: 1 Procedure OT Treatments $Self Care/Home Management : 23-37 mins  Malka So 01/18/2015, 2:45 PM  [phone]

## 2015-01-18 NOTE — Progress Notes (Signed)
Ankit [name], MD Physician Addendum Physical Medicine and Rehabilitation Consult Note 01/15/2015 2:33 PM  Related encounter: ED to Hosp-Admission (Current) from 01/13/2015 in [facility]        Physical Medicine and Rehabilitation Consult Reason for Consult: Left subthalamic lacunar infarct Referring Physician: [facility]   HPI: Michelle Holmes is a 64 y.o. right hand is female with history of COPD, systolic congestive heart failure, hypertension, diabetes mellitus peripheral neuropathy, chronic renal insufficiency with baseline creatinine 2.69, CAD with stenting maintained on aspirin and Plavix. History taken from chart review and patient. Patient lives with family and used a cane prior to admission. One level home. She has assistance as needed from her daughter 4/7. Presented 01/14/2015 with right-sided weakness, slurred speech and blood sugar 504. MRI of the brain showed acute ischemic infarct centered at the inferior left thalamus/left cerebral peduncle with additional punctate foci of diffusion abnormality within the right basal ganglia. Echocardiogram with ejection fraction of 123456 grade 1 diastolic dysfunction. Carotid Dopplers with no ICA stenosis. Patient did not receive TPA. Neurology consulted presently maintained on aspirin and Plavix for CVA prophylaxis. Subcutaneous Lovenox for DVT prophylaxis. Tolerating a regular diet. Hemoglobin A1c 13.9 with insulin therapy as directed. Physical therapy evaluation completed 01/15/2015 with recommendations of physical medicine rehabilitation consult.  Pt very somnolent this AM and part of the day yesterday. CT head ordered yesterday, which was stable, repeat ordered today. ABG ordered as well.  Review of Systems  Unable to perform ROS: acuity of condition   Past Medical History  Diagnosis Date  . Emphysema   . Colon cancer ([place])   . Hypertension   . Hypercholesteremia    . Diabetes mellitus   . COPD (chronic obstructive pulmonary disease) ([place])   . Coronary artery disease   . Obesity   . Sleep apnea   . TIA (transient ischemic attack)   . CHF (congestive heart failure) ([place])   . Anemia   . DJD (degenerative joint disease)   . Renal insufficiency   . Chronic back pain   . Scoliosis   . Obesity hypoventilation syndrome ([place])   . Stroke ([place])   . Myocardial infarction [facility])    Past Surgical History  Procedure Laterality Date  . Coronary angioplasty with stent placement    . Colon surgery     History reviewed. No pertinent family history. Social History:  reports that she has quit smoking. She does not have any smokeless tobacco history on file. She reports that she does not drink alcohol or use illicit drugs. Allergies:  Allergies  Allergen Reactions  . Penicillins Hives and Swelling    Has patient had a PCN reaction causing immediate rash, facial/tongue/throat swelling, SOB or lightheadedness with hypotension: yes Has patient had a PCN reaction causing severe rash involving mucus membranes or skin necrosis: no Has patient had a PCN reaction that required hospitalization no Has patient had a PCN reaction occurring within the last 10 years: no If all of the above answers are "NO", then may proceed with Cephalosporin use.    Medications Prior to Admission  Medication Sig Dispense Refill  . ADVAIR DISKUS 250-50 MCG/DOSE AEPB Inhale 1 puff into the lungs 2 (two) times daily.     Marland Kitchen alprazolam (XANAX) 2 MG tablet Take 4 mg by mouth at bedtime.    Marland Kitchen amLODipine (NORVASC) 10 MG tablet Take 10 mg by mouth daily.     Marland Kitchen  aspirin EC 81 MG EC tablet Take 1 tablet (81 mg total) by mouth daily. 30 tablet 3  . atorvastatin (LIPITOR) 40 MG tablet Take 40 mg by mouth daily.  3  . clopidogrel (PLAVIX) 75 MG tablet Take 75 mg by mouth daily.  3  . ferrous  sulfate 325 (65 FE) MG tablet Take 1 tablet (325 mg total) by mouth 2 (two) times daily with a meal. 60 tablet 3  . gemfibrozil (LOPID) 600 MG tablet Take 600 mg by mouth 2 (two) times daily before a meal.    . insulin glargine (LANTUS) 100 UNIT/ML injection Inject 0.6 mLs (60 Units total) into the skin 2 (two) times daily. 10 mL 11  . losartan (COZAAR) 100 MG tablet Take 100 mg by mouth daily.     Marland Kitchen LYRICA 50 MG capsule Take 50 mg by mouth 2 (two) times daily.  3  . metoprolol succinate (TOPROL-XL) 100 MG 24 hr tablet Take 100 mg by mouth daily. Take with or immediately following a meal.    . ondansetron (ZOFRAN) 4 MG tablet Take 1 tablet (4 mg total) by mouth every 6 (six) hours. (Patient taking differently: Take 4 mg by mouth every 8 (eight) hours as needed for nausea. ) 12 tablet 0  . OxyCODONE (OXYCONTIN) 80 mg T12A 12 hr tablet Take 80 mg by mouth 2 (two) times daily as needed (pain).   0  . pantoprazole (PROTONIX) 40 MG tablet Take 40 mg by mouth daily.  3  . predniSONE (DELTASONE) 5 MG tablet Tapering dose for 12 days 12-14 to 12-26  6 for 2 days, 5 for 2 days, 4 for 2 days, 3 for 2 days, 2 for 2 days, 1 for 2 days  0  . rosuvastatin (CRESTOR) 10 MG tablet Take 10 mg by mouth daily.     . traMADol (ULTRAM) 50 MG tablet Take 50 mg by mouth every 4 (four) hours as needed for moderate pain.   0    Home: Home Living Family/patient expects to be discharged to:: Private residence Living Arrangements: Other relatives Media planner and Film/video editor) Available Help at Discharge: Family, Available 24 hours/day Type of Home: House Home Access: Level entry Home Layout: One level Bathroom Shower/Tub: Gaffer, Door Home Equipment: Cane - single point Additional Comments: Unsure of accuracy of pt's responses. Pt became angry and refused to answer any more questions. Lives With: Family  Functional History: Prior Function Comments:  Unsure of PLOF. No family present.  Functional Status:  Mobility: Bed Mobility Overal bed mobility: Needs Assistance Bed Mobility: Rolling, Sidelying to Sit Rolling: Mod assist Sidelying to sit: Max assist, HOB elevated General bed mobility comments: HOB elevated, use of bedrail. Rolled to R side with mod assist to trunk. Max assist to progress legs off bed and to support trunk to come to sitting position. Transfers Overall transfer level: Needs assistance Equipment used: None Transfers: Sit to/from Stand Sit to Stand: Max assist General transfer comment: unable to stand from recliner using armrest on Lt with one person assist. (?effort by pt as she did not want to get out of recliner)      ADL: ADL Overall ADL's : Needs assistance/impaired Toilet Transfer: Maximal assistance, +2 for physical assistance, Cueing for safety, Cueing for sequencing, Stand-pivot, BSC Toileting- Clothing Manipulation and Hygiene: Maximal assistance, +2 for physical assistance, Cueing for safety, Cueing for sequencing, Sit to/from stand Functional mobility during ADLs: Maximal assistance, +2 for physical assistance, Cueing for safety, Cueing for sequencing General  ADL Comments: Pt with decreased awareness of deficits and impaired cognition. Pt reufsed post-acute rehab when MD suggested during session and became agitated. Pt is max +2 assist for transfers and requires mod verbal cues to attend to RUE and RLE.   Cognition: Cognition Overall Cognitive Status: Difficult to assess Arousal/Alertness: Awake/alert Orientation Level: Oriented X4 Attention: Sustained Sustained Attention: Appears intact Memory: Appears intact (recalled had a cva) Awareness: Appears intact Problem Solving: Impaired Problem Solving Impairment: Functional basic (to locate call bell in bed on her right side, verbal/tactile cues required) Behaviors: Restless Safety/Judgment: Appears intact (need to call for assistance due ot sensory  and motor deficits) Comments: pt able to name 4 animals in 60 seconds Cognition Arousal/Alertness: Awake/alert Behavior During Therapy: Flat affect Overall Cognitive Status: Difficult to assess Area of Impairment: Attention, Memory, Awareness, Following commands, Safety/judgement, Problem solving, Orientation Orientation Level: Situation Current Attention Level: Sustained Memory: Decreased recall of precautions, Decreased short-term memory Following Commands: Follows one step commands inconsistently, Follows one step commands with increased time Safety/Judgement: Decreased awareness of safety, Decreased awareness of deficits Awareness: Intellectual Problem Solving: Slow processing, Decreased initiation, Difficulty sequencing, Requires verbal cues, Requires tactile cues General Comments: Unsure fo cognitive baseline. Pt became agitated intermittently throughout session with no obvious cause.  Difficult to assess due to: Impaired communication  Blood pressure 98/49, pulse 47, temperature 97.8 F (36.6 C), temperature source Oral, resp. rate 16, height 5\' 5"  (1.651 m), weight 103.193 kg (227 lb 8 oz), SpO2 99 %. Physical Exam  Vitals reviewed. Constitutional: She appears well-developed and well-nourished.  Obese  HENT:  Head: Normocephalic and atraumatic.  Right facial droop  Eyes: Conjunctivae and EOM are normal.  Neck: Normal range of motion. Neck supple. No thyromegaly present.  Cardiovascular: Normal rate and regular rhythm.  Respiratory: Effort normal and breath sounds normal. No respiratory distress.  GI: Soft. Bowel sounds are normal. She exhibits no distension.  Musculoskeletal: She exhibits no edema or tenderness.  Neurological: She has normal reflexes.  Patient extremely somnolent, and difficult to arouse A&Ox2 Able to follow simple commands when awake  Unable to assess sensation and cranial nerves due to lack of attention Motor: LUE: 4/5, limited by inattention LLE:  4/5, limited by inattention RLE: 2/5, limited by inattention  RUE: not moving, ?participation  Skin: Skin is warm and dry.  Psychiatric: Her affect is inappropriate. Her speech is delayed and slurred. She is slowed. Cognition and memory are impaired. She expresses inappropriate judgment.     Lab Results Last 24 Hours    Results for orders placed or performed during the hospital encounter of 01/13/15 (from the past 24 hour(s))  Glucose, capillary Status: Abnormal   Collection Time: 01/15/15 11:30 AM  Result Value Ref Range   Glucose-Capillary 178 (H) 65 - 99 mg/dL  Glucose, capillary Status: Abnormal   Collection Time: 01/15/15 4:20 PM  Result Value Ref Range   Glucose-Capillary 197 (H) 65 - 99 mg/dL  Glucose, capillary Status: Abnormal   Collection Time: 01/15/15 10:21 PM  Result Value Ref Range   Glucose-Capillary 187 (H) 65 - 99 mg/dL  Basic metabolic panel Status: Abnormal   Collection Time: 01/16/15 6:44 AM  Result Value Ref Range   Sodium 142 135 - 145 mmol/L   Potassium 4.8 3.5 - 5.1 mmol/L   Chloride 116 (H) 101 - 111 mmol/L   CO2 17 (L) 22 - 32 mmol/L   Glucose, Bld 70 65 - 99 mg/dL   BUN 69 (H) 6 - 20  mg/dL   Creatinine, Ser 3.12 (H) 0.44 - 1.00 mg/dL   Calcium 9.1 8.9 - 10.3 mg/dL   GFR calc non Af Amer 15 (L) >60 mL/min   GFR calc Af Amer 17 (L) >60 mL/min   Anion gap 9 5 - 15  CBC Status: Abnormal   Collection Time: 01/16/15 6:44 AM  Result Value Ref Range   WBC 9.9 4.0 - 10.5 K/uL   RBC 3.48 (L) 3.87 - 5.11 MIL/uL   Hemoglobin 11.0 (L) 12.0 - 15.0 g/dL   HCT 33.1 (L) 36.0 - 46.0 %   MCV 95.1 78.0 - 100.0 fL   MCH 31.6 26.0 - 34.0 pg   MCHC 33.2 30.0 - 36.0 g/dL   RDW 15.9 (H) 11.5 - 15.5 %   Platelets 179 150 - 400 K/uL  Glucose, capillary Status: None   Collection Time: 01/16/15 6:45 AM  Result  Value Ref Range   Glucose-Capillary 67 65 - 99 mg/dL  Glucose, capillary Status: Abnormal   Collection Time: 01/16/15 7:02 AM  Result Value Ref Range   Glucose-Capillary 186 (H) 65 - 99 mg/dL  Glucose, capillary Status: None   Collection Time: 01/16/15 9:30 AM  Result Value Ref Range   Glucose-Capillary 76 65 - 99 mg/dL  Glucose, capillary Status: None   Collection Time: 01/16/15 10:44 AM  Result Value Ref Range   Glucose-Capillary 65 65 - 99 mg/dL      Imaging Results (Last 48 hours)    Ct Head Wo Contrast  01/15/2015 CLINICAL DATA: Sequela of cerebral infarction EXAM: CT HEAD WITHOUT CONTRAST TECHNIQUE: Contiguous axial images were obtained from the base of the skull through the vertex without intravenous contrast. COMPARISON: Two days ago FINDINGS: The acute left subthalamic lacunar infarct shows no hemorrhagic conversion or evidence of growth. Stable chronic ischemic injury in the bilateral cerebral white matter, left putamen, and in the anterior left frontal cortex. No new site of infarct is identified. No hydrocephalus, shift, or mass lesion. Diffuse intracranial calcified atherosclerosis. No new orbital or osseous findings. Clear visualized paranasal sinuses and mastoids. IMPRESSION: Stable exam. No hemorrhagic conversion or evidence of infarct extension. Electronically Signed By: Monte Fantasia M.D. On: 01/15/2015 03:33   Dg Abd Portable 1v  01/16/2015 CLINICAL DATA: Constipation and vomiting episodes this morning. EXAM: PORTABLE ABDOMEN - 1 VIEW COMPARISON: CT 02/10/2014 and abdominal radiographs 06/20/2014 FINDINGS: Again noted is marked dextroscoliosis of the thoracolumbar spine. Few surgical clips scattered throughout the abdomen and pelvis. There is gas within the small and large bowel. Small amount amount of stool in the colon. Multiple pelvic calcifications. IMPRESSION: Nonobstructive bowel gas pattern Electronically  Signed By: Markus Daft M.D. On: 01/16/2015 09:52     Assessment/Plan: Diagnosis: Left subthalamic lacunar infarct Labs and images independently reviewed. Records reviewed and summated above. Stroke: Continue secondary stroke prophylaxis and Risk Factor Modification listed below:  Antiplatelet therapy Blood Pressure Management: Continue current medication with prn's with permisive HTN per primary team Statin Agent Diabetes management Tobacco abuse Right sided hemiparesis: fit for orthotics to prevent contractures (resting hand splint for day, wrist cock up splint at night, PRAFO, etc) Motor recovery: Fluoxetine  1. Does the need for close, 24 hr/day medical supervision in concert with the patient's rehab needs make it unreasonable for this patient to be served in a less intensive setting? Yes  2. Co-Morbidities requiring supervision/potential complications: DM with peripheral neuropathy -currently labile as well (Monitor in accordance with exercise and adjust meds as necessary) , HTN is currently  labile pressures (monitor and provide prns in accordance with increased physical exertion and pain), hypercholesterolemia (cont meds), COPD (continue to monitor RR and Sats with increased physical activity), previous stroke with ? Right-sided residual weakness, CAD with stenting (continue meds), systolic congestive heart failure (Monitor in accordance with increased physical activity and avoid UE resistance excercises), AKI on CKD (avoid nephrotoxic meds) 3. Due to bladder management, safety, disease management, medication administration and patient education, does the patient require 24 hr/day rehab nursing? Yes 4. Does the patient require coordinated care of a physician, rehab nurse, PT (1-2 hrs/day, 5 days/week), OT (1-2 hrs/day, 5 days/week) and SLP (1-2 hrs/day, 5 days/week) to address physical and functional deficits in the context of the above medical diagnosis(es)? Yes Addressing  deficits in the following areas: balance, endurance, locomotion, strength, transferring, bowel/bladder control, bathing, dressing, feeding, grooming, toileting, cognition, speech, language, swallowing and psychosocial support 5. Can the patient actively participate in an intensive therapy program of at least 3 hrs of therapy per day at least 5 days per week? Not at present 6. The potential for patient hemipato make measurable gains while on inpatient rehab is excellent 7. Anticipated functional outcomes upon discharge from inpatient rehab are min assist with PT, min assist with OT, supervision and min assist with SLP. 8. Estimated rehab length of stay to reach the above functional goals is: 20-24 days. 9. Does the patient have adequate social supports and living environment to accommodate these discharge functional goals? Yes 10. Anticipated D/C setting: Home 11. Anticipated post D/C treatments: HH therapy and Home excercise program 12. Overall Rehab/Functional Prognosis: good  RECOMMENDATIONS: This patient's condition is appropriate for continued rehabilitative care in the following setting: Likely CIR, however patient is currently unable to tolerate 3 hours therapy a day and acute medical workup is ongoing for increase lethargy and somnolence. Will continue to follow Patient has agreed to participate in recommended program. Potentially Note that insurance prior authorization may be required for reimbursement for recommended care.  Comment: Rehab Admissions Coordinator to follow up  Delice Lesch, MD 01/16/2015       Revision History     Date/Time User Provider Type Action   01/16/2015 11:31 AM Ankit [name], MD Physician Addend   01/16/2015 11:30 AM Ankit [name], MD Physician Sign   01/16/2015 7:35 AM Cathlyn Parsons, PA-C Physician Assistant Pend   View Details Report       Routing History     Date/Time From To Method   01/16/2015 11:31 AM Ankit [name], MD  Ankit [name], MD In [facility]   01/16/2015 11:31 AM Ankit [name], MD Charolette Forward, MD Fax

## 2015-01-19 ENCOUNTER — Inpatient Hospital Stay (HOSPITAL_COMMUNITY): Payer: Medicare Other | Admitting: Occupational Therapy

## 2015-01-19 ENCOUNTER — Inpatient Hospital Stay (HOSPITAL_COMMUNITY): Payer: Medicare Other | Admitting: Speech Pathology

## 2015-01-19 ENCOUNTER — Inpatient Hospital Stay (HOSPITAL_COMMUNITY): Payer: Medicare Other | Admitting: Physical Therapy

## 2015-01-19 DIAGNOSIS — I639 Cerebral infarction, unspecified: Secondary | ICD-10-CM

## 2015-01-19 LAB — GLUCOSE, CAPILLARY
Glucose-Capillary: 77 mg/dL (ref 65–99)
Glucose-Capillary: 102 mg/dL — ABNORMAL HIGH (ref 65–99)
Glucose-Capillary: 123 mg/dL — ABNORMAL HIGH (ref 65–99)
Glucose-Capillary: 187 mg/dL — ABNORMAL HIGH (ref 65–99)

## 2015-01-19 NOTE — Progress Notes (Signed)
Pt was assisted to the floor by therapist and nurse tech during transfer. No injuries noted, no c/o pain. Swords MD notified. Will continue to monitor.

## 2015-01-19 NOTE — Evaluation (Signed)
Speech Language Pathology Assessment and Plan  Patient Details  Name: Michelle Holmes MRN: [id number] Date of Birth: [birth year]  SLP Diagnosis: Dysarthria;Cognitive Impairments;Dysphagia  Rehab Potential: Good ELOS: 21-28 days     Today's Date: 01/19/2015 SLP Individual Time: 8921-1941 SLP Individual Time Calculation (min): 56 min   Problem List:  Patient Active Problem List   Diagnosis Date Noted  . Acute ischemic VBA thalamic stroke ([place]) 01/18/2015  . AKI (acute kidney injury) ([place])   . Dysarthria   . Lethargy   . DM type 2 with diabetic peripheral neuropathy ([place])   . Labile blood pressure   . Hyperlipidemia   . History of CVA with residual deficit   . Chronic obstructive pulmonary disease ([place])   . Hemiparesis, aphasia, and dysphagia as late effect of cerebrovascular accident (CVA) ([place])   . Acute ischemic stroke ([place])   . CVA (cerebral vascular accident) ([place]) 01/14/2015  . CVA (cerebral infarction) 01/14/2015  . Right hemiplegia ([place])   . Right sided weakness 01/13/2015  . Abdominal pain 06/20/2014  . Altered mental status 02/08/2012  . Sleep apnea 12/08/2010  . Morbid obesity ([place]) 12/08/2010  . CHF (congestive heart failure) ([place]) 12/08/2010  . Chronic renal failure 12/08/2010  . TIA (transient ischemic attack) 12/08/2010  . CAD (coronary artery disease) 12/08/2010  . Cellulitis of pubic region 12/01/2010  . Uncontrolled type 2 DM with hyperosmolar nonketotic hyperglycemia ([place]) 12/01/2010   Past Medical History:  Past Medical History  Diagnosis Date  . Emphysema   . Colon cancer ([place])   . Hypertension   . Hypercholesteremia   . Diabetes mellitus   . COPD (chronic obstructive pulmonary disease) ([place])   . Coronary artery disease   . Obesity   . Sleep apnea   . TIA (transient ischemic attack)   . CHF (congestive heart failure) ([place])   . Anemia   . DJD (degenerative joint disease)   . Renal insufficiency   . Chronic back pain   . Scoliosis   . Obesity  hypoventilation syndrome ([place])   . Stroke ([place])   . Myocardial infarction [facility])    Past Surgical History:  Past Surgical History  Procedure Laterality Date  . Coronary angioplasty with stent placement    . Colon surgery      Assessment / Plan / Recommendation Clinical Impression   Michelle Holmes is an 64 year old right handed female with history of CVA with residual right hemiparesis and dysarthria who was admitted on 01/13/15 with worsening of right sided weakness. MRI brain done revealing acute infarct left thalamus and left cerebral peduncle adjacent to limb of left internal capsul and tiny acute ischemic infarct right basal ganglia.  Pt admitted to CIR on 01/18/2015.  SLP evaluation completed on 01/19/2015 with the following results:  Pt presents with a mild orally based dysphagia due to right sided labial, lingual and buccal weakness which results in prolonged mastication and oral transit of boluses.  Mild residual solids were left in the oral cavity post swallow which were cleared with a liquid wash.  Overall, pt demonstrated good toleration of thin liquids via straw; however, she demonstrated x1 immediate cough following large consecutive boluses.   Pt also presents with a mild dysarthria due to the abovementioned oral motor deficits which results in imprecise articulation of consonants and decreased speech intelligibility at the phrase level.  Furthermore, pt demonstrates moderate cognitive deficits characterized by decreased sustained attention to tasks and decreased emergent awareness of deficits which impact all  higher level cognitive processes.  No family present to verify cognitive baseline although family friends reported that pt had a "private nurse" prior to admission and that pt's daughters managed her medications.   Pt was living independently prior to admission and has experienced a loss of function due to CVA.  As a result, pt would benefit from skilled ST while inpatient in order to  maximize functional independence and reduce burden of care prior to discharge. Anticipate that pt will need close 24/7 supervision in addition to ST follow up at next level of care.     Skilled Therapeutic Interventions          Cognitive-linguistic and bedside swallowing evaluation completed with results and recommendations reviewed with family. SLP initiated skilled education regarding compensatory dysarthria strategies to facilitate improved speech intelligibility.  Handout was posted in pt's room to maximize carryover in between therapy sessions.  Family friends also made aware that SLP was recommending 24/7 supervision at discharge due to cognitive deficits.      SLP Assessment  Patient will need skilled Speech Lanaguage Pathology Services during CIR admission    Recommendations  SLP Diet Recommendations: Dysphagia 3 (Mech soft);Thin Liquid Administration via: Cup;Straw Medication Administration: Whole meds with liquid Supervision: Patient able to self feed;Full supervision/cueing for compensatory strategies Compensations: Slow rate;Small sips/bites;Lingual sweep for clearance of pocketing;Minimize environmental distractions Postural Changes and/or Swallow Maneuvers: Seated upright 90 degrees Oral Care Recommendations: Oral care BID Patient destination: Home Follow up Recommendations: 24 hour supervision/assistance;Home Health SLP;Outpatient SLP Equipment Recommended: None recommended by SLP    SLP Frequency 3 to 5 out of 7 days   SLP Treatment/Interventions Cognitive remediation/compensation;Cueing hierarchy;Dysphagia/aspiration precaution training;Environmental controls;Functional tasks;Patient/family education;Internal/external aids   Pain Pain Assessment Pain Assessment: No/denies pain Prior Functioning Cognitive/Linguistic Baseline: Baseline deficits Baseline deficit details: suspected; no family present to verify but friends report pt had a hired Marine scientist and her daughters managed  her medications  Type of Home: House  Lives With: Family Available Help at Discharge: Family;Available 24 hours/day Vocation: On disability  Function:  Eating Eating   Modified Consistency Diet: Yes Eating Assist Level: Supervision or verbal cues;Helper checks for pocketed food           Cognition Comprehension Comprehension assist level: Understands basic 90% of the time/cues < 10% of the time  Expression   Expression assist level: Expresses basic 50 - 74% of the time/requires cueing 25 - 49% of the time. Needs to repeat parts of sentences.  Social Interaction Social Interaction assist level: Interacts appropriately 50 - 74% of the time - May be physically or verbally inappropriate.  Problem Solving Problem solving assist level: Solves basic 50 - 74% of the time/requires cueing 25 - 49% of the time  Memory Memory assist level: Recognizes or recalls 25 - 49% of the time/requires cueing 50 - 75% of the time   Short Term Goals: Week 1: SLP Short Term Goal 1 (Week 1): Pt will consume dys 3 textures and thin liquids with min assist verbal cues to monitor and correct oral residue and rate of self feeding over 3 targeted sessions.  SLP Short Term Goal 2 (Week 1): Pt will sustain her attention to basic familiar tasks for 3-5 minutes with mod verbal cues for redirection.  SLP Short Term Goal 3 (Week 1): Pt will be intelligible at the phrase level in 75% of opportunities with max assist verbal cues.    Refer to Care Plan for Long Term Goals  Recommendations for other services:  None  Discharge Criteria: Patient will be discharged from SLP if patient refuses treatment 3 consecutive times without medical reason, if treatment goals not met, if there is a change in medical status, if patient makes no progress towards goals or if patient is discharged from hospital.  The above assessment, treatment plan, treatment alternatives and goals were discussed and mutually agreed upon: by  patient  Emilio Math 01/19/2015, 12:50 PM

## 2015-01-19 NOTE — Evaluation (Signed)
Physical Therapy Assessment and Plan  Patient Details  Name: Michelle Holmes MRN: [id number] Date of Birth: 06-Aug-1950  PT Diagnosis: Abnormal posture, Abnormality of gait, Coordination disorder, Hemiparesis R side, Impaired cognition and Muscle weakness Rehab Potential: Fair ELOS: 21 days   Today's Date: 01/19/2015 PT Individual Time: 0800-0910 PT Individual Time Calculation (min): 70 min    Problem List:  Patient Active Problem List   Diagnosis Date Noted  . Acute ischemic VBA thalamic stroke ([place]) 01/18/2015  . AKI (acute kidney injury) ([place])   . Dysarthria   . Lethargy   . DM type 2 with diabetic peripheral neuropathy ([place])   . Labile blood pressure   . Hyperlipidemia   . History of CVA with residual deficit   . Chronic obstructive pulmonary disease ([place])   . Hemiparesis, aphasia, and dysphagia as late effect of cerebrovascular accident (CVA) ([place])   . Acute ischemic stroke ([place])   . CVA (cerebral vascular accident) ([place]) 01/14/2015  . CVA (cerebral infarction) 01/14/2015  . Right hemiplegia ([place])   . Right sided weakness 01/13/2015  . Abdominal pain 06/20/2014  . Altered mental status 02/08/2012  . Sleep apnea 12/08/2010  . Morbid obesity ([place]) 12/08/2010  . CHF (congestive heart failure) ([place]) 12/08/2010  . Chronic renal failure 12/08/2010  . TIA (transient ischemic attack) 12/08/2010  . CAD (coronary artery disease) 12/08/2010  . Cellulitis of pubic region 12/01/2010  . Uncontrolled type 2 DM with hyperosmolar nonketotic hyperglycemia ([place]) 12/01/2010    Past Medical History:  Past Medical History  Diagnosis Date  . Emphysema   . Colon cancer ([place])   . Hypertension   . Hypercholesteremia   . Diabetes mellitus   . COPD (chronic obstructive pulmonary disease) ([place])   . Coronary artery disease   . Obesity   . Sleep apnea   . TIA (transient ischemic attack)   . CHF (congestive heart failure) ([place])   . Anemia   . DJD (degenerative joint disease)   . Renal  insufficiency   . Chronic back pain   . Scoliosis   . Obesity hypoventilation syndrome ([place])   . Stroke ([place])   . Myocardial infarction [facility])    Past Surgical History:  Past Surgical History  Procedure Laterality Date  . Coronary angioplasty with stent placement    . Colon surgery      Assessment & Plan Clinical Impression: Michelle Holmes is a 64 y.o. right hand is female (64) with history of COPD with remote tobacco abuse, systolic congestive heart failure, hypertension, diabetes mellitus peripheral neuropathy, chronic renal insufficiency with baseline creatinine 2.69, CAD with stenting maintained on aspirin and Plavix. History taken from chart review and patient.  Patient lives with family and used a cane prior to admission. One level home. She has assistance as needed from her daughter 4/7. Presented 01/14/2015 with right-sided weakness, slurred speech and blood sugar 504. MRI of the brain showed acute ischemic infarct centered at the inferior left thalamus/left cerebral peduncle with additional punctate foci of diffusion abnormality within the right basal ganglia. Echocardiogram with ejection fraction of 66% grade 1 diastolic dysfunction. Carotid Dopplers with no ICA stenosis. Patient did not receive TPA. Neurology consulted presently maintained on aspirin and Plavix for CVA prophylaxis. Subcutaneous Lovenox for DVT prophylaxis.Follow up CT of the head secondary to lethargy 01/15/2105 with expected evolution of the inferior left thalamic lacunar infarct. Patient refused MRI MRA. ABGs unremarkable. EEG without seizure. Lethargy felt possibly related to Xanax and OxyContin that have been discontinued  and mental status has improved. Bouts of constipation with abdominal film showing nonobstructive bowel gas pattern and small amount of stool in the colon. Presently on a dysphagia 2 thin liquid diet.. Hemoglobin A1c 13.9 with insulin therapy as directed. Physical and occupational therapy evaluations completed  01/15/2015 with recommendations of physical medicine rehabilitation consult. Patient to be admitted for comprehensive inpatient rehabilitation program.  Patient transferred to CIR on 01/18/2015 .   Patient currently requires total with mobility secondary to muscle weakness, impaired timing and sequencing, unbalanced muscle activation and decreased coordination, decreased midline orientation, right side neglect and decreased motor planning and decreased sitting balance, decreased standing balance, decreased postural control and decreased balance strategies.  Prior to hospitalization, patient was modified independent  with mobility and lived with Family (grandson and his girlfriend) in a House home.  Home access is  Level entry.  Patient will benefit from skilled PT intervention to maximize safe functional mobility, minimize fall risk and decrease caregiver burden for planned discharge home with 24 hour assist.  Anticipate patient will benefit from follow up [facility] at discharge.  PT - End of Session Activity Tolerance: Tolerates 30+ min activity with multiple rests Endurance Deficit: Yes PT Assessment Rehab Potential (ACUTE/IP ONLY): Fair Barriers to Discharge: Inaccessible home environment;Decreased caregiver support;Other (comment) Barriers to Discharge Comments: pt motivation PT Patient demonstrates impairments in the following area(s): Balance;Behavior;Endurance;Motor;Pain;Safety;Skin Integrity PT Transfers Functional Problem(s): Bed Mobility;Bed to Chair;Car;Furniture PT Locomotion Functional Problem(s): Ambulation;Wheelchair Mobility;Stairs PT Plan PT Intensity: Minimum of 1-2 x/day ,45 to 90 minutes PT Frequency: 5 out of 7 days PT Duration Estimated Length of Stay: 21 days PT Treatment/Interventions: Ambulation/gait training;Balance/vestibular training;DME/adaptive equipment instruction;Neuromuscular re-education;Stair training;Psychosocial support;UE/LE Strength taining/ROM;Wheelchair  propulsion/positioning;UE/LE Coordination activities;Therapeutic Activities;Cognitive remediation/compensation;Functional mobility training;Patient/family education;Splinting/orthotics;Therapeutic Exercise PT Transfers Anticipated Outcome(s): min assist PT Locomotion Anticipated Outcome(s): min assist PT Recommendation Recommendations for Other Services: Neuropsych consult Follow Up Recommendations: Home health PT;24 hour supervision/assistance Equipment Recommended: Wheelchair (measurements);Wheelchair cushion (measurements) Equipment Details: 18x18 w/c with standard cushion and leg rests  Skilled Therapeutic Intervention Pt received resting in bed and agreeable to therapy session with encouragement.  Pt with impaired sustained attention and memory and requiring frequent cues throughout session to redirect and attend to task.  Supine>sit with min assist for RUE with HOB elevated and bedrails.  Dynamic sitting balance EOB for LB dressing (total assist for dressing, min assist for balance).  Sit>stand at EOB +2 assist and total assist to pull pants up.  Squat/pivot from EOB>w/c +2 assist and verbal cues for sequencing.  PT attempted pt instruction in w/c propulsion using L hemi-technique but pt unwilling to attempt stating, "I just am not in the mood to push no w/c today."  PT transported pt to therapy gym and +2 squat/pivot from w/c>therapy mat.  Dynamic sitting balance coming to L and R elbow prop and returning to midline.  Supervision and verbal cues to come from L elbow prop to midline and min assist to come from R elbow prop to midline.  STEDY transfer with +2 from therapy mat>w/c and pt transported to hallway railing.  Sit>stand with max assist and attempted gait but pt refused and ubruptly sat down with total assist to control descent.  Re-attempted instruction in L hemi-technique for w/c propulsion and pt propelled 5' with max assist before stopping and stating that she did not want to do anything  else but go back to her room, despite max encouragement from PT.  Pt returned to room and requesting to use bathroom.  +  2 stand/pivot to toilet with NT and pt attempted to pull pants down mid-transfer causing her R leg to buckle and assisted fall occurred.  Vitals assessed and WNL and no injury occurred, RN aware.  Floor>BSC transfer with maxi-move as pt continued to request to use the toilet.  STEDY transfer from [facility] to bed following toileting.  Pt left with call bell in reach and needs met, NT f/u'ing up with RN to assess pt following assisted fall.   PT Evaluation Precautions/Restrictions Precautions Precautions: Fall Precaution Comments: R hemiparesis Restrictions Weight Bearing Restrictions: No General   Vital SignsTherapy Vitals Pulse Rate: 80 Resp: 18 BP: 117/82 mmHg Patient Position (if appropriate): Sitting Oxygen Therapy SpO2: 97 % O2 Device: Not Delivered Pain Pain Assessment Pain Assessment: No/denies pain Home Living/Prior Functioning Home Living Available Help at Discharge: Family;Available 24 hours/day (per pt report: grandson and his girlfriend) Type of Home: House Home Access: Level entry Home Layout: One level Additional Comments: Unsure of accuracy of pt's responses.    Lives With: Family (grandson and his girlfriend) Prior Function Level of Independence: Requires assistive device for independence (SPC)  Able to Take Stairs?: Yes Driving: No Vocation: On disability Comments: Unsure of PLOF. No family present.  Vision/Perception  Vision - Assessment Additional Comments: no glasses found in room, pt reports she wears glasses all the times and it is blurry without them  Cognition Overall Cognitive Status: Impaired/Different from baseline Arousal/Alertness: Awake/alert Orientation Level: Oriented to person;Disoriented to place;Disoriented to time;Disoriented to situation (unable to state birthday, name only) Awareness: Impaired Awareness Impairment: Emergent  impairment Problem Solving: Impaired Problem Solving Impairment: Functional basic Behaviors: Restless;Poor frustration tolerance Sensation Sensation Light Touch: Impaired by gross assessment Proprioception: Impaired by gross assessment Coordination Gross Motor Movements are Fluid and Coordinated: No Fine Motor Movements are Fluid and Coordinated: No Motor  Motor Motor: Hemiplegia;Abnormal postural alignment and control  Mobility Bed Mobility Bed Mobility: Sit to Supine;Supine to Sit Supine to Sit: 4: Min assist Supine to Sit Details: Verbal cues for technique;Verbal cues for sequencing;Verbal cues for safe use of DME/AE;Manual facilitation for placement Sit to Supine: 2: Max assist Sit to Supine - Details: Verbal cues for sequencing;Verbal cues for precautions/safety;Manual facilitation for placement;Verbal cues for safe use of DME/AE Transfers Transfers: Yes Sit to Stand: 1: +2 Total assist Sit to Stand Details: Manual facilitation for placement;Manual facilitation for weight shifting;Verbal cues for safe use of DME/AE;Verbal cues for sequencing;Verbal cues for technique Stand to Sit: 1: +2 Total assist Stand to Sit Details (indicate cue type and reason): Manual facilitation for placement;Verbal cues for safe use of DME/AE;Manual facilitation for weight shifting;Verbal cues for precautions/safety;Verbal cues for technique Squat Pivot Transfers: 1: +2 Total assist Squat Pivot Transfer Details: Manual facilitation for weight shifting;Verbal cues for technique;Verbal cues for precautions/safety;Verbal cues for safe use of DME/AE;Manual facilitation for placement Locomotion  Ambulation Ambulation: No Gait Gait: No Stairs / Additional Locomotion Stairs: No Wheelchair Mobility Wheelchair Mobility: Yes Wheelchair Assistance: 2: Max Lexicographer: Left upper extremity;Left lower extremity Wheelchair Parts Management: Needs assistance Distance: 5  Trunk/Postural  Assessment  Postural Control Postural Control: Deficits on evaluation (intermittent pushing to R, delayed protective reactions)  Balance Balance Balance Assessed: Yes Static Sitting Balance Static Sitting - Balance Support: No upper extremity supported Static Sitting - Level of Assistance:  (initial max assist fade to supervision) Dynamic Sitting Balance Dynamic Sitting - Balance Support: Feet supported;Left upper extremity supported Dynamic Sitting - Level of Assistance: 4: Min assist Dynamic Sitting -  Balance Activities: Trunk control activities;Forward lean/weight shifting;Lateral lean/weight shifting Sitting balance - Comments: edge of bed for LB dressing and edge of mat to test protective reactions Static Standing Balance Static Standing - Balance Support: Left upper extremity supported Static Standing - Level of Assistance: 3: Mod assist Extremity Assessment      RLE Assessment RLE Assessment: Exceptions to [facility] RLE AROM (degrees) RLE Overall AROM Comments: minimal active hip adduction noted in sitting RLE PROM (degrees) RLE Overall PROM Comments: WFL for hip flexion, knee flex/ext, and DF/PF RLE Strength RLE Overall Strength Comments: hemiparesis LLE Assessment LLE Assessment:  (grossly 3+/5 based on functional mobility)   See Function Navigator for Current Functional Status.   Refer to Care Plan for Long Term Goals  Recommendations for other services: Neuropsych  Discharge Criteria: Patient will be discharged from PT if patient refuses treatment 3 consecutive times without medical reason, if treatment goals not met, if there is a change in medical status, if patient makes no progress towards goals or if patient is discharged from hospital.  The above assessment, treatment plan, treatment alternatives and goals were discussed and mutually agreed upon: No family available/patient unable  Urban Gibson E Penven-Crew 01/19/2015, 12:07 PM

## 2015-01-19 NOTE — Progress Notes (Signed)
Occupational Therapy Assessment and Plan  Patient Details  Name: Michelle Holmes MRN: [id number] Date of Birth: 07-05-1950  OT Diagnosis: abnormal posture, cognitive deficits, flaccid hemiplegia and hemiparesis, hemiplegia affecting non-dominant side, muscle weakness (generalized) and pain in joint Rehab Potential: Rehab Potential (ACUTE ONLY): Good ELOS: 21 days   Today's Date: 01/19/2015 OT Individual Time:  -   1300-1400  (60 min)       Problem List:  Patient Active Problem List   Diagnosis Date Noted  . Acute ischemic VBA thalamic stroke ([place]) 01/18/2015  . AKI (acute kidney injury) ([place])   . Dysarthria   . Lethargy   . DM type 2 with diabetic peripheral neuropathy ([place])   . Labile blood pressure   . Hyperlipidemia   . History of CVA with residual deficit   . Chronic obstructive pulmonary disease ([place])   . Hemiparesis, aphasia, and dysphagia as late effect of cerebrovascular accident (CVA) ([place])   . Acute ischemic stroke ([place])   . CVA (cerebral vascular accident) ([place]) 01/14/2015  . CVA (cerebral infarction) 01/14/2015  . Right hemiplegia ([place])   . Right sided weakness 01/13/2015  . Abdominal pain 06/20/2014  . Altered mental status 02/08/2012  . Sleep apnea 12/08/2010  . Morbid obesity ([place]) 12/08/2010  . CHF (congestive heart failure) ([place]) 12/08/2010  . Chronic renal failure 12/08/2010  . TIA (transient ischemic attack) 12/08/2010  . CAD (coronary artery disease) 12/08/2010  . Cellulitis of pubic region 12/01/2010  . Uncontrolled type 2 DM with hyperosmolar nonketotic hyperglycemia ([place]) 12/01/2010    Past Medical History:  Past Medical History  Diagnosis Date  . Emphysema   . Colon cancer ([place])   . Hypertension   . Hypercholesteremia   . Diabetes mellitus   . COPD (chronic obstructive pulmonary disease) ([place])   . Coronary artery disease   . Obesity   . Sleep apnea   . TIA (transient ischemic attack)   . CHF (congestive heart failure) ([place])   . Anemia   .  DJD (degenerative joint disease)   . Renal insufficiency   . Chronic back pain   . Scoliosis   . Obesity hypoventilation syndrome ([place])   . Stroke ([place])   . Myocardial infarction [facility])    Past Surgical History:  Past Surgical History  Procedure Laterality Date  . Coronary angioplasty with stent placement    . Colon surgery      Assessment & Plan Clinical Impression: Michelle Holmes is an 64 year old right handed female with history of HTN, chronic diastolic CHF, COPD, OAS/OHS--oxygen dependent, CAD, CVA with residual right hemiparesis and dysarthria who was admitted on 01/13/15 with worsening of right sided weakness and elevated BS- 504. MRI brain done revealing acute infarct left thalamus and left cerebral peduncle adjacent to limb of left internal capsul and tiny acute ischemic infarct right basal ganglia. Carotid dopplers without significant abnormality. 2D echo with EF 55-60% with no wall abnormality. She has had worsening of symptoms with lethargy on 12/20 but refused repeat MRI. Dr. Leonie Man felt that patient had left subcortical infarcts due to small vessel disease with fluctuating right hemiplegia ? Capsular warning syndrome. She is to continue ASA/Plavix for secondary stroke prevention as well as better control of risk factors. Patient currently with worsening of dysarthria and right sided weakness, dysphagia and fluctuating mental status affecting mobility, safety with diet and ability to carry out ADL tasks. Has had improvement in mentation today and diet advanced to dysphagia 3, thin liquids  Patient transferred to CIR on 01/18/2015 .    Patient currently requires max with basic self-care skills secondary to muscle weakness and muscle paralysis, abnormal tone, unbalanced muscle activation, decreased coordination and decreased motor planning, decreased motor planning and decreased initiation, decreased attention, decreased awareness, decreased problem solving, decreased safety awareness and  decreased memory.  Prior to hospitalization, patient could complete BADL with modified independent .  Patient will benefit from skilled intervention to decrease level of assist with basic self-care skills prior to discharge home with care partner.  Anticipate patient will require 24 hour supervision and minimal physical assistance and follow up home health.  OT - End of Session Endurance Deficit: Yes Endurance Deficit Description: needed frequent rest breaks through out session OT Assessment Rehab Potential (ACUTE ONLY): Good Barriers to Discharge: Decreased caregiver support OT Plan OT Intensity: Minimum of 1-2 x/day, 45 to 90 minutes OT Frequency: 5 out of 7 days OT Duration/Estimated Length of Stay: 21 days OT Treatment/Interventions: Balance/vestibular training;Cognitive remediation/compensation;Discharge planning;DME/adaptive equipment instruction;Functional mobility training;Neuromuscular re-education;Pain management;Patient/family education;Self Care/advanced ADL retraining;Therapeutic Activities;Therapeutic Exercise;UE/LE Strength taining/ROM;UE/LE Coordination activities;Visual/perceptual remediation/compensation;Wheelchair propulsion/positioning OT Recommendation Patient destination: Home Follow Up Recommendations: Home health OT;24 hour supervision/assistance Equipment Recommended: 3 in 1 bedside comode;Tub/shower bench;To be determined   Skilled Therapeutic Intervention Pt. Lying in bed upon OT arrival.  Addressed bed mobility, transfers, sitting balance, RUE NMRE, cognition.  Pt was difficult to assess due to irritation with questions and short attention span.  Pt transferred to EOB with mod asssit and then to 3n1.  Pt refuses to doff clothes and only did partial bathe.  She was +2 for bed to 3 n1.  Use stedy for 3n1 to bed for safety as pt buckled with right knee during the first transfer.  Pt returned to bed and provided some cut up fruit for her to eat.  She needed cues to slow  pace and swallow completely between each bite.  Left pt in bed with all needs in reach.    OT Evaluation Precautions/Restrictions  Precautions Precautions: Fall Precaution Comments: R hemiparesis Restrictions Weight Bearing Restrictions: No General   Vital Signs Therapy Vitals Temp: 97.7 F (36.5 C) Temp Source: Oral Pulse Rate: 69 Resp: 18 BP: 133/68 mmHg Patient Position (if appropriate): Lying Oxygen Therapy SpO2: 97 % O2 Device: Not Delivered Pain   None reported   Home Living/Prior Functioning Home Living Available Help at Discharge: Family, Available 24 hours/day Type of Home: House Home Access: Level entry Home Layout: One level Bathroom Shower/Tub: Gaffer, Door Bathroom Toilet: Standard Additional Comments: Unsure of accuracy of pt's responses.    Lives With: Family IADL History Homemaking Responsibilities: Yes Meal Prep Responsibility: Secondary Laundry Responsibility: Secondary Cleaning Responsibility: Secondary Bill Paying/Finance Responsibility: No Shopping Responsibility: No Current License: No Prior Function Level of Independence: Requires assistive device for independence  Able to Take Stairs?: Yes Driving: No Vocation: On disability Comments: Unsure of PLOF. No family present.  ADL   Vision/Perception  Vision- History Baseline Vision/History: Wears glasses Wears Glasses: At all times Patient Visual Report: No change from baseline Vision- Assessment Vision Assessment?: No apparent visual deficits  Cognition Overall Cognitive Status: Impaired/Different from baseline Arousal/Alertness: Awake/alert Orientation Level: Person Year: Other (Comment) (2007) Month: December Day of Week: Incorrect Memory: Impaired Memory Impairment: Decreased recall of new information;Retrieval deficit Immediate Memory Recall: Sock;Blue;Bed Memory Recall: Sock Memory Recall Sock: Without Cue Memory Recall Blue:  (unable) Memory Recall Bed:   (unable) Attention: Sustained Sustained Attention: Impaired Sustained Attention Impairment: Verbal  basic;Functional basic Awareness: Impaired Awareness Impairment: Emergent impairment Problem Solving: Impaired Problem Solving Impairment: Functional basic Behaviors: Restless;Poor frustration tolerance Safety/Judgment: Impaired Sensation Sensation Light Touch: Impaired by gross assessment Coordination Gross Motor Movements are Fluid and Coordinated: No Fine Motor Movements are Fluid and Coordinated: No Motor  Motor Motor: Hemiplegia;Abnormal postural alignment and control Mobility  Bed Mobility Bed Mobility: Sit to Supine;Supine to Sit Supine to Sit: 3: Mod assist Supine to Sit Details: Verbal cues for technique;Verbal cues for sequencing;Verbal cues for safe use of DME/AE;Manual facilitation for placement Sit to Supine: 2: Max assist Sit to Supine - Details: Verbal cues for sequencing;Verbal cues for precautions/safety;Manual facilitation for placement;Verbal cues for safe use of DME/AE Transfers Sit to Stand: 1: +2 Total assist Sit to Stand Details: Manual facilitation for placement Stand to Sit: 1: +2 Total assist Stand to Sit Details (indicate cue type and reason): Manual facilitation for placement;Verbal cues for safe use of DME/AE;Manual facilitation for weight shifting;Verbal cues for precautions/safety;Verbal cues for technique  Trunk/Postural Assessment  Cervical Assessment Cervical Assessment: Within Functional Limits Thoracic Assessment Thoracic Assessment: Within Functional Limits Lumbar Assessment Lumbar Assessment: Within Functional Limits Postural Control Postural Control: Deficits on evaluation  Balance Balance Balance Assessed: Yes Static Sitting Balance Static Sitting - Balance Support: No upper extremity supported;Feet supported Static Sitting - Level of Assistance: 4: Min assist Dynamic Sitting Balance Dynamic Sitting - Balance Support: Feet  supported;Left upper extremity supported Dynamic Sitting - Level of Assistance: 4: Min assist Dynamic Sitting - Balance Activities: Trunk control activities;Forward lean/weight shifting;Lateral lean/weight shifting Sitting balance - Comments: edge of bed for LB dressing  Static Standing Balance Static Standing - Balance Support: Left upper extremity supported;During functional activity Static Standing - Level of Assistance: 2: Max assist Extremity/Trunk Assessment RUE Assessment RUE Assessment: Exceptions to [facility] LUE Assessment LUE Assessment: Within Functional Limits   See Function Navigator for Current Functional Status.   Refer to Care Plan for Long Term Goals  Recommendations for other services: None  Discharge Criteria: Patient will be discharged from OT if patient refuses treatment 3 consecutive times without medical reason, if treatment goals not met, if there is a change in medical status, if patient makes no progress towards goals or if patient is discharged from hospital.  The above assessment, treatment plan, treatment alternatives and goals were discussed and mutually agreed upon: by patient and No family available/patient unable  Lisa Roca 01/19/2015, 5:56 PM

## 2015-01-19 NOTE — Progress Notes (Signed)
Michelle Holmes is a 64 y.o. female 23-May-1950 [id number]  Subjective: No new complaints. No new problems. Slept well. Feeling OK.  Objective: Vital signs in last 24 hours: Temp:  [97.4 F (36.3 C)-98.5 F (36.9 C)] 98 F (36.7 C) (12/24 1302) Pulse Rate:  [62-80] 72 (12/24 1302) Resp:  [18] 18 (12/24 1302) BP: (111-156)/(70-84) 156/84 mmHg (12/24 1302) SpO2:  [97 %-100 %] 98 % (12/24 1302) Weight:  [219 lb 5.7 oz (99.5 kg)] 219 lb 5.7 oz (99.5 kg) (12/24 0529) Weight change:  Last BM Date: 01/18/15  Intake/Output from previous day: 12/23 0701 - 12/24 0700 In: 120 [P.O.:120] Out: -  Last cbgs: CBG (last 3)   Recent Labs  01/18/15 2107 01/19/15 0641 01/19/15 1124  GLUCAP 148* 77 102*     Physical Exam General: No apparent distress   HEENT: not dry Lungs: Normal effort. Lungs clear to auscultation, no crackles or wheezes. Cardiovascular: Regular rate and rhythm, no edema Abdomen: S/NT/ND; BS(+) Musculoskeletal:  unchanged Neurological: No new neurological deficits Wounds: N/A    Skin: clear  Aging changes Mental state: Alert, dysarthric, cooperative In a w/c   Lab Results: BMET    Component Value Date/Time   NA 143 01/18/2015 1833   K 5.3* 01/18/2015 1833   CL 114* 01/18/2015 1833   CO2 21* 01/18/2015 1833   GLUCOSE 193* 01/18/2015 1833   BUN 56* 01/18/2015 1833   CREATININE 3.11* 01/18/2015 1833   CALCIUM 9.3 01/18/2015 1833   GFRNONAA 15* 01/18/2015 1833   GFRAA 17* 01/18/2015 1833   CBC    Component Value Date/Time   WBC 5.7 01/18/2015 1833   WBC 11.8* 07/13/2005 1527   RBC 3.36* 01/18/2015 1833   RBC 3.83 07/13/2005 1527   HGB 10.8* 01/18/2015 1833   HGB 11.9 07/13/2005 1527   HCT 33.1* 01/18/2015 1833   HCT 34.8 07/13/2005 1527   PLT 154 01/18/2015 1833   PLT 223 07/13/2005 1527   MCV 98.5 01/18/2015 1833   MCV 91.0 07/13/2005 1527   MCH 32.1 01/18/2015 1833   MCH 31.2 07/13/2005 1527   MCHC 32.6 01/18/2015 1833   MCHC 34.2 07/13/2005  1527   RDW 15.7* 01/18/2015 1833   RDW 14.9* 07/13/2005 1527   LYMPHSABS 1.7 01/18/2015 1833   LYMPHSABS 7.2* 07/13/2005 1527   MONOABS 0.3 01/18/2015 1833   MONOABS 0.7 07/13/2005 1527   EOSABS 0.2 01/18/2015 1833   EOSABS 0.3 07/13/2005 1527   BASOSABS 0.0 01/18/2015 1833   BASOSABS 0.0 07/13/2005 1527    Studies/Results: No results found.  Medications: I have reviewed the patient's current medications.  Assessment/Plan:   1. Dysphagia, Aphasia, hemiparesis secondary to Left subthalamic lacunar infarct 2. DVT Prophylaxis/Anticoagulation: Pharmaceutical: Lovenox 3. Chronic back pain/ Pain Management: On lyrica bid. Off OxyContin and ultram at this time. Will use Tylenol prn to prevent sedation.  4. Mood: LCSW to follow for evaluation and support.  5. Neuropsych: This patient is not capable of making decisions on her own behalf. 6. Skin/Wound Care: Maintain adequate nutrition and hydration status. Routine pressure relief measures.  7. Fluids/Electrolytes/Nutrition: Monitor I/O. Check lytes in am. Offer supplements if intake poor. Will add protein supplement.  8. COPD: Continue IS. On dulera bid.  9. OSA/OHS: Question use of CPAP at bedtime 10. Dyslipidemia: Continue Lipitor and fenofibrate. 11. DM type 2: Monitor BS ac/hs. Lantus on hold due to hypoglycemic episodes. Use SSI for elevated BS --intake should improve as bouts of lethargy resolve.  12. HTN: Monitor  TID and slowly resume home medications. Avoid hypotension to allow for adequate perfusion.  13. CKD stage 4: Baseline creatinine at 2.7 per records review--was as high as 3.2-3.5 in May and 2.7 in 08/2014. Continue to Monitor with serial checks. Encourage po intake.    Length of stay, days: 1  Walker Kehr , MD 01/19/2015, 2:23 PM

## 2015-01-20 DIAGNOSIS — M6289 Other specified disorders of muscle: Secondary | ICD-10-CM

## 2015-01-20 LAB — GLUCOSE, CAPILLARY
Glucose-Capillary: 180 mg/dL — ABNORMAL HIGH (ref 65–99)
Glucose-Capillary: 190 mg/dL — ABNORMAL HIGH (ref 65–99)
Glucose-Capillary: 227 mg/dL — ABNORMAL HIGH (ref 65–99)
Glucose-Capillary: 283 mg/dL — ABNORMAL HIGH (ref 65–99)

## 2015-01-20 NOTE — Progress Notes (Signed)
Michelle Holmes is a 64 y.o. female 25-Mar-1950 [id number]  Subjective: No new complaints. No new problems. Slept well.  Objective: Vital signs in last 24 hours: Temp:  [97.7 F (36.5 C)-99.7 F (37.6 C)] 98.3 F (36.8 C) (12/25 0915) Pulse Rate:  [69-78] 71 (12/25 0915) Resp:  [18-20] 20 (12/25 0915) BP: (132-156)/(68-85) 132/74 mmHg (12/25 0915) SpO2:  [97 %-99 %] 99 % (12/25 0915) Weight:  [220 lb 10.9 oz (100.1 kg)] 220 lb 10.9 oz (100.1 kg) (12/25 0513) Weight change: 1 lb 5.2 oz (0.6 kg) Last BM Date: 01/18/15  Intake/Output from previous day: 12/24 0701 - 12/25 0700 In: 480 [P.O.:480] Out: -  Last cbgs: CBG (last 3)   Recent Labs  01/19/15 2048 01/20/15 0655 01/20/15 1134  GLUCAP 187* 180* 190*     Physical Exam General: No apparent distress   HEENT: not dry Lungs: Normal effort. Lungs clear to auscultation, no crackles or wheezes. Cardiovascular: Regular rate and rhythm, no edema Abdomen: S/NT/ND; BS(+) Musculoskeletal:  unchanged Neurological: No new neurological deficits  Skin: clear  Aging changes Mental state: Alert, dysarthric, cooperative    Lab Results: BMET    Component Value Date/Time   NA 143 01/18/2015 1833   K 5.3* 01/18/2015 1833   CL 114* 01/18/2015 1833   CO2 21* 01/18/2015 1833   GLUCOSE 193* 01/18/2015 1833   BUN 56* 01/18/2015 1833   CREATININE 3.11* 01/18/2015 1833   CALCIUM 9.3 01/18/2015 1833   GFRNONAA 15* 01/18/2015 1833   GFRAA 17* 01/18/2015 1833   CBC    Component Value Date/Time   WBC 5.7 01/18/2015 1833   WBC 11.8* 07/13/2005 1527   RBC 3.36* 01/18/2015 1833   RBC 3.83 07/13/2005 1527   HGB 10.8* 01/18/2015 1833   HGB 11.9 07/13/2005 1527   HCT 33.1* 01/18/2015 1833   HCT 34.8 07/13/2005 1527   PLT 154 01/18/2015 1833   PLT 223 07/13/2005 1527   MCV 98.5 01/18/2015 1833   MCV 91.0 07/13/2005 1527   MCH 32.1 01/18/2015 1833   MCH 31.2 07/13/2005 1527   MCHC 32.6 01/18/2015 1833   MCHC 34.2 07/13/2005 1527   RDW 15.7* 01/18/2015 1833   RDW 14.9* 07/13/2005 1527   LYMPHSABS 1.7 01/18/2015 1833   LYMPHSABS 7.2* 07/13/2005 1527   MONOABS 0.3 01/18/2015 1833   MONOABS 0.7 07/13/2005 1527   EOSABS 0.2 01/18/2015 1833   EOSABS 0.3 07/13/2005 1527   BASOSABS 0.0 01/18/2015 1833   BASOSABS 0.0 07/13/2005 1527    Studies/Results: No results found.  Medications: I have reviewed the patient's current medications.  Assessment/Plan:   1. Dysphagia, Aphasia, hemiparesis secondary to Left subthalamic lacunar infarct 2. DVT Prophylaxis/Anticoagulation: Pharmaceutical: Lovenox 3. Chronic back pain/ Pain Management: On lyrica bid. Off OxyContin and ultram at this time. Will use Tylenol prn to prevent sedation.  4. Mood: LCSW to follow for evaluation and support.  5. Neuropsych: This patient is not capable of making decisions on her own behalf. 6. Skin/Wound Care: Maintain adequate nutrition and hydration status. Routine pressure relief measures.  7. Fluids/Electrolytes/Nutrition: Monitor I/O. Check lytes in am. Offer supplements if intake poor. Will add protein supplement.  8. COPD: Continue IS. On dulera bid.  9. OSA/OHS: Question use of CPAP at bedtime 10. Dyslipidemia: Continue Lipitor and fenofibrate. 11. DM type 2: Monitor BS ac/hs. Lantus on hold due to hypoglycemic episodes. Use SSI for elevated BS --intake should improve as bouts of lethargy resolve.  12. HTN: Monitor TID and slowly resume  home medications. Avoid hypotension to allow for adequate perfusion.  13. CKD stage 4: Baseline creatinine at 2.7 per records review--was as high as 3.2-3.5 in May and 2.7 in 08/2014. Continue to Monitor with serial checks. Encourage po intake.   Cont current Rx  Length of stay, days: 2  Walker Kehr , MD 01/20/2015, 12:05 PM

## 2015-01-21 ENCOUNTER — Inpatient Hospital Stay (HOSPITAL_COMMUNITY): Payer: Medicare Other | Admitting: *Deleted

## 2015-01-21 ENCOUNTER — Inpatient Hospital Stay (HOSPITAL_COMMUNITY): Payer: Medicare Other | Admitting: Speech Pathology

## 2015-01-21 ENCOUNTER — Inpatient Hospital Stay (HOSPITAL_COMMUNITY): Payer: Self-pay | Admitting: Occupational Therapy

## 2015-01-21 LAB — CBC
HCT: 33.3 % — ABNORMAL LOW (ref 36.0–46.0)
Hemoglobin: 10.7 g/dL — ABNORMAL LOW (ref 12.0–15.0)
MCH: 31.9 pg (ref 26.0–34.0)
MCHC: 32.1 g/dL (ref 30.0–36.0)
MCV: 99.4 fL (ref 78.0–100.0)
Platelets: 148 10*3/uL — ABNORMAL LOW (ref 150–400)
RBC: 3.35 MIL/uL — ABNORMAL LOW (ref 3.87–5.11)
RDW: 15 % (ref 11.5–15.5)
WBC: 6.6 10*3/uL (ref 4.0–10.5)

## 2015-01-21 LAB — BASIC METABOLIC PANEL
Anion gap: 11 (ref 5–15)
BUN: 48 mg/dL — ABNORMAL HIGH (ref 6–20)
CO2: 18 mmol/L — ABNORMAL LOW (ref 22–32)
Calcium: 9.3 mg/dL (ref 8.9–10.3)
Chloride: 115 mmol/L — ABNORMAL HIGH (ref 101–111)
Creatinine, Ser: 3.06 mg/dL — ABNORMAL HIGH (ref 0.44–1.00)
GFR calc Af Amer: 18 mL/min — ABNORMAL LOW (ref >60)
GFR calc non Af Amer: 15 mL/min — ABNORMAL LOW (ref >60)
Glucose, Bld: 234 mg/dL — ABNORMAL HIGH (ref 65–99)
Potassium: 5 mmol/L (ref 3.5–5.1)
Sodium: 144 mmol/L (ref 135–145)

## 2015-01-21 LAB — GLUCOSE, CAPILLARY
Glucose-Capillary: 247 mg/dL — ABNORMAL HIGH (ref 65–99)
Glucose-Capillary: 204 mg/dL — ABNORMAL HIGH (ref 65–99)
Glucose-Capillary: 202 mg/dL — ABNORMAL HIGH (ref 65–99)
Glucose-Capillary: 284 mg/dL — ABNORMAL HIGH (ref 65–99)

## 2015-01-21 MED ORDER — HYDROCERIN EX CREA
TOPICAL_CREAM | Freq: Two times a day (BID) | CUTANEOUS | Status: DC
  Administered 2015-01-21 – 2015-01-22 (×3): via TOPICAL
  Administered 2015-01-22: 1 via TOPICAL
  Administered 2015-01-23: 09:00:00 via TOPICAL
  Administered 2015-01-23 – 2015-01-24 (×2): 1 via TOPICAL
  Administered 2015-01-24 – 2015-01-29 (×9): via TOPICAL
  Administered 2015-01-29 – 2015-01-30 (×2): 1 via TOPICAL
  Administered 2015-01-31 – 2015-02-04 (×5): via TOPICAL
  Administered 2015-02-04: 1 via TOPICAL
  Administered 2015-02-05 – 2015-02-07 (×4): via TOPICAL
  Filled 2015-01-21 (×2): qty 113

## 2015-01-21 MED ORDER — INSULIN GLARGINE 100 UNIT/ML ~~LOC~~ SOLN
15.0000 [IU] | Freq: Every day | SUBCUTANEOUS | Status: DC
Start: 2015-01-21 — End: 2015-01-22
  Administered 2015-01-21: 15 [IU] via SUBCUTANEOUS
  Filled 2015-01-21: qty 0.15

## 2015-01-21 NOTE — IPOC Note (Signed)
Overall Plan of Care [facility]) Patient Details Name: Michelle Holmes MRN: [id number] DOB: [birth year]  Admitting Diagnosis: [facility] Problems: Active Problems:   Uncontrolled type 2 DM with hyperosmolar nonketotic hyperglycemia (HCC)   Sleep apnea   Morbid obesity ([place])   CHF (congestive heart failure) (H[facility])   Chronic renal failure   CAD (coronary artery disease)   Altered mental status   Right sided weakness   CVA (cerebral vascular accident) ([place])   Right hemiplegia ([place])   DM type 2 with diabetic peripheral neuropathy ([place])   History of CVA with residual deficit   Chronic obstructive pulmonary disease (HCC)   Hemiparesis, aphasia, and dysphagia as late effect of cerebrovascular accident (CVA) ([place])   Acute ischemic VBA thalamic stroke ([place])     Functional Problem List: Nursing Edema, Endurance, Medication Management, Motor, Nutrition, Safety, Skin Integrity  PT Balance, Behavior, Endurance, Motor, Pain, Safety, Skin Integrity  OT Balance, Behavior, Cognition, Endurance, Motor, Perception, Safety, Sensory  SLP Cognition, Linguistic, Nutrition, Safety  TR         Basic ADL's: OT Grooming, Bathing, Eating, Dressing, Toileting     Advanced  ADL's: OT       Transfers: PT Bed Mobility, Bed to Chair, Car, Manufacturing systems engineer, Metallurgist: PT Ambulation, Emergency planning/management officer, Stairs     Additional Impairments: OT Fuctional Use of Upper Extremity  SLP Swallowing, Communication, Social Cognition expression Awareness, Attention, Problem Solving  TR      Anticipated Outcomes Item Anticipated Outcome  Self Feeding will be intermittent supervision  Swallowing  Supervision    Basic self-care  min assist  Toileting  min assist   Bathroom Transfers min assist to 3n1  Bowel/Bladder  Mod assist  Transfers  min assist  Locomotion  min assist  Communication  Min assist   Cognition  Min assist   Pain  < 4  Safety/Judgment  Min assist    Therapy Plan: PT Intensity: Minimum of 1-2 x/day ,45 to 90 minutes PT Frequency: 5 out of 7 days PT Duration Estimated Length of Stay: 21 days OT Intensity: Minimum of 1-2 x/day, 45 to 90 minutes OT Frequency: 5 out of 7 days OT Duration/Estimated Length of Stay: 21 days SLP Intensity: Minumum of 1-2 x/day, 30 to 90 minutes SLP Frequency: 3 to 5 out of 7 days SLP Duration/Estimated Length of Stay: 21-28 days        Team Interventions: Nursing Interventions Patient/Family Education, Disease Management/Prevention, Medication Management, Skin Care/Wound Management, Cognitive Remediation/Compensation, Dysphagia/Aspiration Precaution Training, Discharge Planning  PT interventions Ambulation/gait training, Training and development officer, DME/adaptive equipment instruction, Neuromuscular re-education, Stair training, Psychosocial support, UE/LE Strength taining/ROM, Wheelchair propulsion/positioning, UE/LE Coordination activities, Therapeutic Activities, Cognitive remediation/compensation, Functional mobility training, Patient/family education, Splinting/orthotics, Therapeutic Exercise  OT Interventions Balance/vestibular training, Cognitive remediation/compensation, Discharge planning, DME/adaptive equipment instruction, Functional mobility training, Neuromuscular re-education, Pain management, Patient/family education, Self Care/advanced ADL retraining, Therapeutic Activities, Therapeutic Exercise, UE/LE Strength taining/ROM, UE/LE Coordination activities, Visual/perceptual remediation/compensation, Wheelchair propulsion/positioning  SLP Interventions Cognitive remediation/compensation, Cueing hierarchy, Dysphagia/aspiration precaution training, Environmental controls, Functional tasks, Patient/family education, Internal/external aids  TR Interventions    SW/CM Interventions Discharge Planning, Psychosocial Support, Patient/Family Education    Team Discharge Planning: Destination: PT-  ,OT- Home ,  SLP-Home Projected Follow-up: PT-Home health PT, 24 hour supervision/assistance, OT-  Home health OT, 24 hour supervision/assistance, SLP-24 hour supervision/assistance, Home Health SLP, Outpatient SLP Projected Equipment Needs: PT-Wheelchair (measurements), Wheelchair cushion (measurements), OT- 3 in 1 bedside comode,  Tub/shower bench, To be determined, SLP-None recommended by SLP Equipment Details: PT-18x18 w/c with standard cushion and leg rests, OT-  Patient/family involved in discharge planning: PT- Patient unable/family or caregiver not available,  OT-Patient, Patient unable/family or caregiver not available, SLP-Patient, Family member/caregiver  MD ELOS: 20-24 days Medical Rehab Prognosis:  Good Assessment: 64 year old right handed female with history of HTN, chronic diastolic CHF, COPD, OAS/OHS--oxygen dependent, CAD, CVA with residual right hemiparesis and dysarthria who was admitted on 01/13/15 with worsening of right sided weakness and elevated BS- 504. MRI brain done revealing acute infarct left thalamus and left cerebral peduncle adjacent to limb of left internal capsul and tiny acute ischemic infarct right basal ganglia. She has had worsening of symptoms with lethargy on 12/20 but refused repeat MRI. Dr. Leonie Man felt that patient had left subcortical infarcts due to small vessel disease with fluctuating right hemiplegia ? Capsular warning syndrome. She is to continue ASA/Plavix for secondary stroke prevention as well as better control of risk factors. Patient currently with worsening of dysarthria and right sided weakness, dysphagia and fluctuating mental status affecting mobility, safety with diet and ability to carry out ADL tasks. Her diet advanced to dysphagia 3, thin liquids.   See Team Conference Notes for weekly updates to the plan of care

## 2015-01-21 NOTE — Care Management Note (Signed)
Inpatient Rehabilitation Center Individual Statement of Services  Patient Name:  Michelle Holmes  Date:  01/21/2015  Welcome to the [facility].  Our goal is to provide you with an individualized program based on your diagnosis and situation, designed to meet your specific needs.  With this comprehensive rehabilitation program, you will be expected to participate in at least 3 hours of rehabilitation therapies Monday-Friday, with modified therapy programming on the weekends.  Your rehabilitation program will include the following services:  Physical Therapy (PT), Occupational Therapy (OT), Speech Therapy (ST), 24 hour per day rehabilitation nursing, Therapeutic Recreaction (TR), Case Management (Social Worker), Rehabilitation Medicine, Nutrition Services and Pharmacy Services  Weekly team conferences will be held on Wednesday to discuss your progress.  Your Social Worker will talk with you frequently to get your input and to update you on team discussions.  Team conferences with you and your family in attendance may also be held.  Expected length of stay: 21 days Overall anticipated outcome: min assist level  Depending on your progress and recovery, your program may change. Your Social Worker will coordinate services and will keep you informed of any changes. Your Social Worker's name and contact numbers are listed  below.  The following services may also be recommended but are not provided by the [facility]:    [facility] will be made to provide these services after discharge if needed.  Arrangements include referral to agencies that provide these services.  Your insurance has been verified to be:  UHC-Medicare & Medicaid Your primary doctor is:  Charolette Forward  Pertinent information will be shared with your doctor and your insurance company.  Social Worker:  Ovidio Kin,  [place] or [phone]  Information discussed with and copy given to patient by: Elease Hashimoto, 01/21/2015, 10:36 AM

## 2015-01-21 NOTE — Progress Notes (Signed)
Physical Therapy Session Note  Patient Details  Name: Michelle Holmes MRN: [id number] Date of Birth: 09/14/1950  Today's Date: 01/21/2015 PT Individual Time: 1000-1040 and 14:35-14:45  PT Individual Time Calculation (min): 40 min and 66min Pt missed 40 min skilled PT due to fatigue.   Short Term Goals: Week 1:  PT Short Term Goal 1 (Week 1): Pt will transition supine>sit using hospital bed functions with supervision PT Short Term Goal 2 (Week 1): Pt will perform sit<>stand with LRAD and max assist  PT Short Term Goal 3 (Week 1): Pt will initiate gait training with PT and LRAD PT Short Term Goal 4 (Week 1): Pt will propel w/c 72' with mod assist  Skilled Therapeutic Interventions/Progress Updates:    Tx focused on functional mobility training, sit<>stands in [place], and NMR via forced use, manual facilitation, and multi-modal cues. Pt in bed, needing to be changed 2/2. She did have a continent urination during therapy via Stedy>toilet later in tx. Pt needed encouragement and input into tx for participation and emotional support.  Rolling and bed mobility >sit with up to Max A and NMR cues for technique. Bridging x8 for NMR and changing.  Static and dynamic unsupported sitting EOB and in Stedy for sitting balance with close S/min-guard du eto trunk rotation an decreased RUE support. Pt needs safety cues throughout. Pt able to perform sit<>stand from high surface with Mod A and maintain standing 2x67min with postural NMR.   Toilet transfer with Stedy and Mod A overall. Pt left up in [facility] with lap belt and CSW present. All needs in reach.   Second tx focused on pt education. Pt declining any attempt at tx including bedside therapy. PT educated on importance of participating at any level she is able to in order to increase independence and prepare for a DC. Pt closing eyes up in [facility], but not wanting to return to bed. Pt tearful that she had no visitors today, so PT assisted pt in calling family before  leaving. Pt states she will try again tomorrow.  Therapy Documentation Precautions:  Precautions Precautions: Fall Precaution Comments: R hemiparesis Restrictions Weight Bearing Restrictions: No General:   Vital Signs: Oxygen Therapy SpO2: 98 % O2 Device: Not Delivered Pain: Pain Assessment Pain Assessment: No/denies pain   See Function Navigator for Current Functional Status.   Therapy/Group: Individual Therapy   Kennieth Rad, PT, DPT   01/21/2015, 11:04 AM

## 2015-01-21 NOTE — Progress Notes (Signed)
Social Work  Social Work Assessment and Plan  Patient Details  Name: Michelle Holmes MRN: [id number] Date of Birth: March 05, 1950  Today's Date: 01/21/2015  Problem List:  Patient Active Problem List   Diagnosis Date Noted  . Acute ischemic VBA thalamic stroke ([place]) 01/18/2015  . AKI (acute kidney injury) ([place])   . Dysarthria   . Lethargy   . DM type 2 with diabetic peripheral neuropathy ([place])   . Labile blood pressure   . Hyperlipidemia   . History of CVA with residual deficit   . Chronic obstructive pulmonary disease ([place])   . Hemiparesis, aphasia, and dysphagia as late effect of cerebrovascular accident (CVA) ([place])   . Acute ischemic stroke ([place])   . CVA (cerebral vascular accident) ([place]) 01/14/2015  . CVA (cerebral infarction) 01/14/2015  . Right hemiplegia ([place])   . Right sided weakness 01/13/2015  . Abdominal pain 06/20/2014  . Altered mental status 02/08/2012  . Sleep apnea 12/08/2010  . Morbid obesity ([place]) 12/08/2010  . CHF (congestive heart failure) ([place]) 12/08/2010  . Chronic renal failure 12/08/2010  . TIA (transient ischemic attack) 12/08/2010  . CAD (coronary artery disease) 12/08/2010  . Cellulitis of pubic region 12/01/2010  . Uncontrolled type 2 DM with hyperosmolar nonketotic hyperglycemia ([place]) 12/01/2010   Past Medical History:  Past Medical History  Diagnosis Date  . Emphysema   . Colon cancer ([place])   . Hypertension   . Hypercholesteremia   . Diabetes mellitus   . COPD (chronic obstructive pulmonary disease) ([place])   . Coronary artery disease   . Obesity   . Sleep apnea   . TIA (transient ischemic attack)   . CHF (congestive heart failure) ([place])   . Anemia   . DJD (degenerative joint disease)   . Renal insufficiency   . Chronic back pain   . Scoliosis   . Obesity hypoventilation syndrome ([place])   . Stroke ([place])   . Myocardial infarction [facility])    Past Surgical History:  Past Surgical History  Procedure Laterality Date  . Coronary angioplasty  with stent placement    . Colon surgery     Social History:  reports that she has quit smoking. She does not have any smokeless tobacco history on file. She reports that she does not drink alcohol or use illicit drugs.  Family / Support Systems Marital Status: Widow/Widower Patient Roles: Parent, Volunteer Children: Denise [phone] Other Supports: Quaantina-daughter [phone]-cell Anticipated Caregiver: [name] and grandson and his girlfriend whom lives with them Ability/Limitations of Caregiver: Family was providing 24 hr supervision prior to admission, but may now need to provide physical care Caregiver Availability: 24/7 Family Dynamics: Close knit family pt has two daughter's who are local and very involved.  [name] lives next door and is over multiple times a day. She has numerous church family who will assist if needed also. Pt feels she has strong supports  Social History Preferred language: English Religion: Holiness Cultural Background: No issues Education: High School Read: Yes Write: Yes Employment Status: Disabled Freight forwarder Issues: No issues Guardian/Conservator: None-according to MD pt is not capable of making her own decisions while here so will look toward both of her daughter's since there is no formal POA in place   Abuse/Neglect Physical Abuse: Denies Verbal Abuse: Denies Sexual Abuse: Denies Exploitation of patient/patient's resources: Denies Self-Neglect: Denies  Emotional Status Pt's affect, behavior adn adjustment status: Pt is motivated and at times becomes frustrated with staff's multiple questions, she feels we should  already know the answer to the ones she has answered before. She does want to get better and regain her independence and get back to her church.  She is very involved in her church and esepcially this time of year. Recent Psychosocial Issues: Othere medical issues-has residual weakness from a past stroke on the same side,  but she managed and walked with a cane. Pyschiatric History: No history deferred depression screen due to pt feels she is coping well and knows that her God is here with her and is at peace with whatever he has in store for her. She feels she is never alone and is very loved. Will monitor and have neuro-psych see if necessary. Substance Abuse History: No issues  Patient / Family Perceptions, Expectations & Goals Pt/Family understanding of illness & functional limitations: Pt and daughter can explain her stroke and deficits. They have spoken with the MD and feel their questions and concerns are being addressed. Both are hopeful she will do well here. Pt feels this one is worse than her first one, but is hopeful. Premorbid pt/family roles/activities: Mother, grandmother, church mother , retiree, etc Anticipated changes in roles/activities/participation: resume Pt/family expectations/goals: Pt states: " My God is good he will look out for me and heal me."  [name] states: " We will do whateve is needed for my mother, she is coming home at discharge."  [employer]: Other (Comment) (in the past) Premorbid Home Care/DME Agencies: Other (Comment) (in the past) Transportation available at discharge: family members Resource referrals recommended: Support group (specify)  Discharge Planning Living Arrangements: Other relatives Support Systems: Children, Other relatives, Friends/neighbors, Church/faith community Type of Residence: Private residence Insurance Resources: [employer] (specify county), Multimedia programmer (specify) Primary school teacher ) Financial Resources: [employer] Financial Screen Referred: No Living Expenses: Education officer, community Management: Patient, Family Does the patient have any problems obtaining your medications?: No Home Management: Family members Patient/Family Preliminary Plans: Pt's grandosn and his girlfriend live with pt and were assisitng wiht home managment prior to  admission and transportation. Her daughter-Denise lives next door and is over multiple times a day, while her other daughter-Quantina is alos local and involved. [name] is aware pt may require physical care  upone dishcarge and is making arrangements for this. Social Work Anticipated Follow Up Needs: HH/OP, Support Group  Clinical Impression Pleasant female who likes to talk about her church and her faith, she is motivated to improve, but is at peace with whatever God has in store for her. Her family are supportive and involved and willing to assist at discharge. Her grandson and his girlfriend live with her and were assisting with home management prior to admission but now pt may require physical care and daughter's are willing to step in and assist with this. Pt gets much support from her church  Members and enjoys their visits here. Will work on a safe discharge plan, encouraged daughter to come in and attend therapies with pt.  Elease Hashimoto 01/21/2015, 11:28 AM

## 2015-01-21 NOTE — Progress Notes (Signed)
Occupational Therapy Session Note  Patient Details  Name: Michelle Holmes MRN: [id number] Date of Birth: 1950/07/09  Today's Date: 01/21/2015 OT Individual Time:  -   1115-1215  (60 min)      Short Term Goals: Week 1:  OT Short Term Goal 1 (Week 1): Pt. will demonstrated safety with eating by eating slowly OT Short Term Goal 2 (Week 1): Pt will balance in stting EOB for 20 minutes with SBA OT Short Term Goal 3 (Week 1): Pt will bath self UB with mod assist OT Short Term Goal 4 (Week 1): Pt will bathe LB with max assist. OT Short Term Goal 5 (Week 1): Pt will transfer to 3n1 with mod assist.   Week 2:     Skilled Therapeutic Interventions/Progress Updates:    Addressed bed mobility, transfers, sitting balance, RUE NMRE, cognition. Pt.sitting in wc upon OT arrival. Pt refused to engage in transfer training and refused to go to gym.   Engaged in LUE AROM at shoulder flexion, abduction x10.  Did RUE PROM and AAROM with Gravity eliminated.  Pt sat unsupported for 2 minutes during exercises.   Did not know day or month (pt  being difficult)  .  Knew year and place and situation.  Pt verbalized that someone brought her a package and she wanted to know what it was.   Had pt use RUE to stabilize package while opening with LUE.  Pt became less resistant to moving as session progressed.  Used stedy and went to toilet with +2 for sit to stand.  Pt. taken back to wc   And left with all needs in place.   AT end of session, pt verbalized the correct month.         Therapy Documentation Precautions:  Precautions Precautions: Fall Precaution Comments: R hemiparesis Restrictions Weight Bearing Restrictions: No General:   Vital Signs: Therapy Vitals Temp: 98.7 F (37.1 C) Temp Source: Oral Pulse Rate: 74 Resp: 18 BP: 127/69 mmHg Patient Position (if appropriate): Lying Oxygen Therapy SpO2: 98 % O2 Device: Not Delivered Pain:   ADL:   Exercises:   Other Treatments:    See Function  Navigator for Current Functional Status.   Therapy/Group: Individual Therapy  Lisa Roca 01/21/2015, 7:58 AM

## 2015-01-21 NOTE — Progress Notes (Signed)
[facility] PHYSICAL MEDICINE & REHABILITATION     PROGRESS NOTE    Subjective/Complaints:  Patient seen this morning resting in bed she is somnolent, but nurse reports she has been doing well overall.  ROS: Denies CP, SOB, N/V/D  Objective: Vital Signs: Blood pressure 127/69, pulse 74, temperature 98.7 F (37.1 C), temperature source Oral, resp. rate 18, weight 99.7 kg (219 lb 12.8 oz), SpO2 98 %. No results found.  Recent Labs  01/18/15 1833  WBC 5.7  HGB 10.8*  HCT 33.1*  PLT 154    Recent Labs  01/18/15 1833  NA 143  K 5.3*  CL 114*  GLUCOSE 193*  BUN 56*  CREATININE 3.11*  CALCIUM 9.3   CBG (last 3)   Recent Labs  01/20/15 2207 01/21/15 0649 01/21/15 1113  GLUCAP 283* 247* 204*    Wt Readings from Last 3 Encounters:  01/21/15 99.7 kg (219 lb 12.8 oz)  01/14/15 103.193 kg (227 lb 8 oz)  01/12/15 102.513 kg (226 lb)    Physical Exam:  BP 127/69 mmHg  Pulse 74  Temp(Src) 98.7 F (37.1 C) (Oral)  Resp 18  Wt 99.7 kg (219 lb 12.8 oz)  SpO2 98% Constitutional: She appears well-developed and well-nourished. NAD HENT: Normocephalic and atraumatic.  Eyes: Conjunctivae and EOM are normal.  Cardiovascular: Normal rate and regular rhythm.  Respiratory: Effort normal and breath sounds normal. No respiratory distress. She has no wheezes.  GI: Soft. Bowel sounds are normal. There is no tenderness.  Musculoskeletal: She exhibits no edema or tenderness.  Neurological: She is somnolent, but is able to answer orientation questions appropriately.  Has poor postural awareness.  Right facial weakness with dysarthric speech  Exam limited by poor participation.  Motor: LUE: 4/5 proximal to distal LLE: 4+/5 proximal to distal  RLE: 0/5 (likely secondary to poor initiation - previously 2/5 hip flexion, 4-/5 ankle dorsi/plantar flexion) RUE: 0/5  Right facial weakness Skin: Skin is warm and dry. No erythema.  Psychiatric: Her affect is blunt. Her speech is  delayed. She is slowed. Cognition and memory are impaired. She expresses inappropriate judgment.  Assessment/Plan: 1. Functional deficits secondary to Left subthalamic lacunar infarct which require 3+ hours per day of interdisciplinary therapy in a comprehensive inpatient rehab setting. Physiatrist is providing close team supervision and 24 hour management of active medical problems listed below. Physiatrist and rehab team continue to assess barriers to discharge/monitor patient progress toward functional and medical goals.  Function:  Bathing Bathing position   Position: Wheelchair/chair at sink  Bathing parts Body parts bathed by patient: Chest, Abdomen, Right upper leg, Left upper leg Body parts bathed by helper: Right arm, Left arm, Buttocks, Right lower leg, Left lower leg, Back  Bathing assist Assist Level: 2 helpers      Upper Body Dressing/Undressing Upper body dressing Upper body dressing/undressing activity did not occur: Refused                  Upper body assist        Lower Body Dressing/Undressing Lower body dressing   What is the patient wearing?: Pants, Non-skid slipper socks       Pants- Performed by helper: Thread/unthread right pants leg, Thread/unthread left pants leg, Pull pants up/down   Non-skid slipper socks- Performed by helper: Don/doff right sock, Don/doff left sock                  Lower body assist Assist for lower body dressing: 2 Helpers  Toileting Toileting Toileting activity did not occur: N/A Toileting steps completed by patient: Performs perineal hygiene Toileting steps completed by helper: Adjust clothing prior to toileting, Adjust clothing after toileting Toileting Assistive Devices: Other (comment) Charlaine Dalton)  Toileting assist Assist level: Touching or steadying assistance (Pt.75%)   Transfers Chair/bed transfer   Chair/bed transfer method: Squat pivot Chair/bed transfer assist level: Moderate assist (Pt 50 - 74%/lift  or lower) Chair/bed transfer assistive device: Mechanical lift Mechanical lift: Stedy   Locomotion Ambulation Ambulation activity did not occur: Refused         Wheelchair   Type: Manual Max wheelchair distance: 5 Assist Level: Maximal assistance (Pt 25 - 49%)  Cognition Comprehension Comprehension assist level: Understands basic 90% of the time/cues < 10% of the time  Expression Expression assist level: Expresses basic 50 - 74% of the time/requires cueing 25 - 49% of the time. Needs to repeat parts of sentences.  Social Interaction Social Interaction assist level: Interacts appropriately 50 - 74% of the time - May be physically or verbally inappropriate.  Problem Solving Problem solving assist level: Solves basic 50 - 74% of the time/requires cueing 25 - 49% of the time  Memory Memory assist level: Recognizes or recalls 25 - 49% of the time/requires cueing 50 - 75% of the time    Medical Problem List and Plan: 1. Dysphagia, Aphasia, hemiparesis secondary to Left subthalamic lacunar infarct 2. DVT Prophylaxis/Anticoagulation: Pharmaceutical: Lovenox 3. Chronic back pain/ Pain Management: On lyrica bid. Off OxyContin and ultram at this time. Will usetylenol prn to prevent sedation.  4. Mood: LCSW to follow for evaluation and support.  5. Neuropsych: This patient is not capable of making decisions on her own behalf. 6. Skin/Wound Care: Maintain adequate nutrition and hydration status. Routine pressure relief measures.  7. Fluids/Electrolytes/Nutrition: Monitor I/O. Offer supplements if intake poor. Protein supplement as.   Repeat labs pending, will review  8. COPD: Continue IS. On dulera bid.  9. OSA/OHS: Question use of CPAP at bedtime  Will follow up 10. Dyslipidemia: Continue Lipitor and fenofibrate. 11. DM type 2: Monitor BS ac/hs.   Start on low-dose Lantus today (home dose appears to be 45 units twice a day) 12. HTN: Monitor and slowly resume home medications.    Avoid hypotension to allow for adequate perfusion. Stable in present 13. CKD stage 4: Baseline creatinine at 2.7 per records review  Continue to Monitor with serial checks. Encourage po intake.   LOS (Days) 3 A FACE TO FACE EVALUATION WAS PERFORMED  Michelle Holmes [name] 01/21/2015 11:41 AM

## 2015-01-21 NOTE — Progress Notes (Signed)
Speech Language Pathology Daily Session Note  Patient Details  Name: Michelle Holmes MRN: [id number] Date of Birth: 1950/02/04  Today's Date: 01/21/2015 SLP Individual Time: 1300-1400 SLP Individual Time Calculation (min): 60 min  Short Term Goals: Week 1: SLP Short Term Goal 1 (Week 1): Pt will consume dys 3 textures and thin liquids with min assist verbal cues to monitor and correct oral residue and rate of self feeding over 3 targeted sessions.  SLP Short Term Goal 2 (Week 1): Pt will sustain her attention to basic familiar tasks for 3-5 minutes with mod verbal cues for redirection.  SLP Short Term Goal 3 (Week 1): Pt will be intelligible at the phrase level in 75% of opportunities with max assist verbal cues.    Skilled Therapeutic Interventions:  Pt was seen for skilled ST targeting goals for dysphagia and cognition.  While consuming dys 3 textures, pt demonstrated effective mastication of solids despite large boluses and was able to clear residuals from the oral cavity post swallow with extra time and supervision cues.  No overt s/s of aspiration were evident with consumption of thin liquids or solids.  Pt required max faded to min assist verbal cues to sustain her attention to self feeding tasks during lunch for ~3 minute intervals.  Pt frequently asked for assistance to do things that she could do herself, such as adjusting her napkin over her shirt or picking up her bread, and became verbally agitated when SLP encouraged her to do those things independently.  Furthermore, pt became verbally agitated with any attempts therapist made to engage her in structured tasks.  Pt was easily redirected and seemed to quickly forget being upset with this therapist.  Pt requested assistance to use the bathroom and returned demonstration for use of call bell with min verbal and visual cues.  She needed mod verbal cues for safety due to impulsivity during transfer on and off commode.  She also benefited from min  verbal cues for sequencing and initiation of hand hygiene after toileting.  Pt was left in wheelchair with quick release belt donned and call bell left within reach.  Continue per current plan of care.    Function:  Eating Eating   Modified Consistency Diet: Yes Eating Assist Level: Helper checks for pocketed food;Set up assist for;Supervision or verbal cues   Eating Set Up Assist For: Opening containers       Cognition Comprehension Comprehension assist level: Understands basic 90% of the time/cues < 10% of the time  Expression   Expression assist level: Expresses basic 50 - 74% of the time/requires cueing 25 - 49% of the time. Needs to repeat parts of sentences.  Social Interaction Social Interaction assist level: Interacts appropriately 50 - 74% of the time - May be physically or verbally inappropriate.  Problem Solving Problem solving assist level: Solves basic 50 - 74% of the time/requires cueing 25 - 49% of the time  Memory Memory assist level: Recognizes or recalls 25 - 49% of the time/requires cueing 50 - 75% of the time    Pain Pain Assessment Pain Assessment: No/denies pain  Therapy/Group: Individual Therapy  Maeva Dant, Selinda Orion 01/21/2015, 3:38 PM

## 2015-01-22 ENCOUNTER — Inpatient Hospital Stay (HOSPITAL_COMMUNITY): Payer: Medicare Other | Admitting: Occupational Therapy

## 2015-01-22 ENCOUNTER — Inpatient Hospital Stay (HOSPITAL_COMMUNITY): Payer: Medicare Other

## 2015-01-22 ENCOUNTER — Inpatient Hospital Stay (HOSPITAL_COMMUNITY): Payer: Medicare Other | Admitting: Speech Pathology

## 2015-01-22 DIAGNOSIS — I63212 Cerebral infarction due to unspecified occlusion or stenosis of left vertebral arteries: Secondary | ICD-10-CM

## 2015-01-22 DIAGNOSIS — I6322 Cerebral infarction due to unspecified occlusion or stenosis of basilar arteries: Secondary | ICD-10-CM

## 2015-01-22 LAB — GLUCOSE, CAPILLARY
Glucose-Capillary: 217 mg/dL — ABNORMAL HIGH (ref 65–99)
Glucose-Capillary: 207 mg/dL — ABNORMAL HIGH (ref 65–99)
Glucose-Capillary: 292 mg/dL — ABNORMAL HIGH (ref 65–99)
Glucose-Capillary: 282 mg/dL — ABNORMAL HIGH (ref 65–99)

## 2015-01-22 MED ORDER — INSULIN GLARGINE 100 UNIT/ML ~~LOC~~ SOLN
20.0000 [IU] | Freq: Every day | SUBCUTANEOUS | Status: DC
  Administered 2015-01-22 (×2): 20 [IU] via SUBCUTANEOUS
  Filled 2015-01-22 (×2): qty 0.2

## 2015-01-22 NOTE — Progress Notes (Signed)
Occupational Therapy Session Note  Patient Details  Name: Michelle Holmes MRN: [id number] Date of Birth: [birth year]  Today's Date: 01/22/2015 OT Individual Time: 1445-1515 OT Individual Time Calculation (min): 15 min scheduled and 15 min make up time    Short Term Goals: Week 1:  OT Short Term Goal 1 (Week 1): Pt. will demonstrated safety with eating by eating slowly OT Short Term Goal 2 (Week 1): Pt will balance in stting EOB for 20 minutes with SBA OT Short Term Goal 3 (Week 1): Pt will bath self UB with mod assist OT Short Term Goal 4 (Week 1): Pt will bathe LB with max assist. OT Short Term Goal 5 (Week 1): Pt will transfer to 3n1 with mod assist.    Skilled Therapeutic Interventions/Progress Updates:    Treatment session for 15 mins and 15 mins make up time from previous missed time.  Focus on activity tolerance, participation in treatment session, and RUE NMR.  Pt received upright in w/c with multiple family members present, pt tearful and requesting to visit for a few mins prior to engaging in therapy session.  Therapist returned later and able to encourage pt to participate in treatment session, incorporating motivating tasks of organizing Christmas presents.  Pt unpackaged presents while using RUE as stabilizer, with hand over hand from therapist, and scanning to Rt to locate items.  Educated on proper positioning of RUE as it would fall over table or half lap tray due to weakness and inattention.  Able to elicit shoulder elevation, extension, and internal rotation with support at elbow to decrease effects of gravity.  Therapist provided max encouragement and discussion of recovery process throughout.  Therapy Documentation Precautions:  Precautions Precautions: Fall Precaution Comments: R hemiparesis Restrictions Weight Bearing Restrictions: No General:   Vital Signs: Therapy Vitals Temp: 98.7 F (37.1 C) Temp Source: Oral Pulse Rate: 73 Resp: 18 BP: 123/61 mmHg Patient  Position (if appropriate): Sitting Oxygen Therapy SpO2: 99 % O2 Device: Not Delivered Pain: Pain Assessment Pain Assessment: No/denies pain  See Function Navigator for Current Functional Status.   Therapy/Group: Individual Therapy  [name] 01/22/2015, 3:41 PM

## 2015-01-22 NOTE — Progress Notes (Signed)
Physical Therapy Session Note  Patient Details  Name: Michelle Holmes MRN: [id number] Date of Birth: 10-26-1950  Today's Date: 01/22/2015 PT Individual Time: 0730-0830 PT Individual Time Calculation (min): 60 min   Short Term Goals: Week 1:  PT Short Term Goal 1 (Week 1): Pt will transition supine>sit using hospital bed functions with supervision PT Short Term Goal 2 (Week 1): Pt will perform sit<>stand with LRAD and max assist  PT Short Term Goal 3 (Week 1): Pt will initiate gait training with PT and LRAD PT Short Term Goal 4 (Week 1): Pt will propel w/c 36' with mod assist  Skilled Therapeutic Interventions/Progress Updates:    Denies pain. Session focused on neuro re-ed for functional bed mobility re-training with faciliation by therapist at trunk and pelvis for normalizing movement patterns, transfers with Stedy OOB, on/off toilet, and into w/c with mod to max A for sit to stand and multimodal cues for postural control and reorientation to midline, sitting and standing balance on toilet while changing pants and brief with cues to attend to position of RUE as pt letting it drop off the side, seated LE therex for functional strengthening and muscle activation with AAROM/PROM on RLE and AROM on L for LAQ, ankle pumps, and marches, and AAROM with LUE assisting RUE seated in w/c. Therapist added half lap tray for RUE support when up in w/c. +2 assist needed for toileting needs. NT notified to assist with breakfast.   Therapy Documentation Precautions:  Precautions Precautions: Fall Precaution Comments: R hemiparesis Restrictions Weight Bearing Restrictions: No   See Function Navigator for Current Functional Status.   Therapy/Group: Individual Therapy  Canary Brim Ivory Broad, PT, DPT  01/22/2015, 8:51 AM

## 2015-01-22 NOTE — Progress Notes (Signed)
Occupational Therapy Session Note  Patient Details  Name: Michelle Holmes MRN: [id number] Date of Birth: February 12, 1950  Today's Date: 01/22/2015 OT Individual Time: 1000-1100 OT Individual Time Calculation (min): 60 min    Short Term Goals: Week 1:  OT Short Term Goal 1 (Week 1): Pt. will demonstrated safety with eating by eating slowly OT Short Term Goal 2 (Week 1): Pt will balance in stting EOB for 20 minutes with SBA OT Short Term Goal 3 (Week 1): Pt will bath self UB with mod assist OT Short Term Goal 4 (Week 1): Pt will bathe LB with max assist. OT Short Term Goal 5 (Week 1): Pt will transfer to 3n1 with mod assist.    Skilled Therapeutic Interventions/Progress Updates:    Pt seen for skilled OT to facilitate functional mobility, midline awareness and activity tolerance with ADL retraining of toileting. Pt did say she needed to toilet when asked. Utilized Stedy lift with assist from Chartered certified accountant. Pt did well using L arm to pull self to stand with max A but did need 2 person A as she pushes wt to her R.  Pt was able to lower self onto toilet with increased control and did cleanse self. Increase assist needed to stand from toilet to [facility] as seat lower than w/c.  Pt positioned back in chair. She declined bathing and dressing stating she completed it early. Pt taken to gym to work on [employer]. When pt asked to transfer to the mat she responded "Hell No".  She explained that she had a previous stroke and recovered at home without "all this stuff" referring to therapy equipment. Reoriented pt to situation and her goals. Pt did agree to work on R arm.  A/AROM with tapping to facilitate movement. Pt did demonstrate active (20% of movement) of shoulder extension, slight shoulder flexion, and trace elbow flexion. She did engage in conversation with short responses when asked about her family.  Pt taken back to room and half lap tray place for RUE support along with quick release belt. Pt in room with all needs  met.  Therapy Documentation Precautions:  Precautions Precautions: Fall Precaution Comments: R hemiparesis Restrictions Weight Bearing Restrictions: No  Vital Signs: Therapy Vitals Temp: 98 F (36.7 C) Temp Source: Oral Pulse Rate: 71 Resp: 19 BP: 120/69 mmHg Patient Position (if appropriate): Lying Oxygen Therapy SpO2: 96 % O2 Device: Not Delivered   Pain: no c/o pain   ADL:   See Function Navigator for Current Functional Status.   Therapy/Group: Individual Therapy  SAGUIER,JULIA 01/22/2015, 8:26 AM

## 2015-01-22 NOTE — Progress Notes (Signed)
Speech Language Pathology Daily Session Note  Patient Details  Name: Michelle Holmes MRN: [id number] Date of Birth: Dec 25, 1950  Today's Date: 01/22/2015 SLP Individual Time: 1103-1201 SLP Individual Time Calculation (min): 58 min  Short Term Goals: Week 1: SLP Short Term Goal 1 (Week 1): Pt will consume dys 3 textures and thin liquids with min assist verbal cues to monitor and correct oral residue and rate of self feeding over 3 targeted sessions.  SLP Short Term Goal 2 (Week 1): Pt will sustain her attention to basic familiar tasks for 3-5 minutes with mod verbal cues for redirection.  SLP Short Term Goal 3 (Week 1): Pt will be intelligible at the phrase level in 75% of opportunities with max assist verbal cues.    Skilled Therapeutic Interventions:  Pt was seen for skilled ST targeting cognitive goals.  SLP facilitated the session with a basic board game targeting sustained attention to task.  Pt attended to task for ~5 minute intervals with min assist verbal cues for redirection; max verbal and visual cues were needed for functional problem solving.  ? Right inattention versus poor effort during the abovementioned task as pt was noted to only attend to the left side of the game board, will monitor via further diagnostic treatment.  Pt was also able to sustain her attention to personally meaningful, functional tasks (watering plants) for 2-3 minute intervals with min assist verbal cues for redirection.  Pt frequently refused to participate in structured tasks, although no verbal agitation was noted on this date as was seen during previous therapy session.  Pt again appeared to quickly forget being upset or frustrated with therapist or task.  Pt requested to call daughter at the end of today's therapy session and required total assist to dial phone number due to pt's self reported visual impairments.  Pt was returned to room and left with call bell within reach.     Function:  Eating Eating                  Cognition Comprehension Comprehension assist level: Understands basic 90% of the time/cues < 10% of the time  Expression   Expression assist level: Expresses basic 50 - 74% of the time/requires cueing 25 - 49% of the time. Needs to repeat parts of sentences.  Social Interaction Social Interaction assist level: Interacts appropriately 50 - 74% of the time - May be physically or verbally inappropriate.  Problem Solving Problem solving assist level: Solves basic 50 - 74% of the time/requires cueing 25 - 49% of the time  Memory Memory assist level: Recognizes or recalls 25 - 49% of the time/requires cueing 50 - 75% of the time    Pain Pain Assessment Pain Assessment: No/denies pain  Therapy/Group: Individual Therapy  Quaid Yeakle, Selinda Orion 01/22/2015, 12:30 PM

## 2015-01-22 NOTE — Progress Notes (Signed)
[facility] PHYSICAL MEDICINE & REHABILITATION     PROGRESS NOTE    Subjective/Complaints:  Pt up with PT today, no c/os  ROS: Denies CP, SOB, N/V/D  Objective: Vital Signs: Blood pressure 120/69, pulse 71, temperature 98 F (36.7 C), temperature source Oral, resp. rate 19, weight 99.9 kg (220 lb 3.8 oz), SpO2 96 %. No results found.  Recent Labs  01/21/15 1134  WBC 6.6  HGB 10.7*  HCT 33.3*  PLT 148*    Recent Labs  01/21/15 1134  NA 144  K 5.0  CL 115*  GLUCOSE 234*  BUN 48*  CREATININE 3.06*  CALCIUM 9.3   CBG (last 3)   Recent Labs  01/21/15 1627 01/21/15 2101 01/22/15 0801  GLUCAP 202* 284* 217*    Wt Readings from Last 3 Encounters:  01/22/15 99.9 kg (220 lb 3.8 oz)  01/14/15 103.193 kg (227 lb 8 oz)  01/12/15 102.513 kg (226 lb)    Physical Exam:  BP 120/69 mmHg  Pulse 71  Temp(Src) 98 F (36.7 C) (Oral)  Resp 19  Wt 99.9 kg (220 lb 3.8 oz)  SpO2 96% Constitutional: She appears well-developed and well-nourished. NAD HENT: Normocephalic and atraumatic.  Eyes: Conjunctivae and EOM are normal.  Cardiovascular: Normal rate and regular rhythm.  Respiratory: Effort normal and breath sounds normal. No respiratory distress. She has no wheezes.  GI: Soft. Bowel sounds are normal. There is no tenderness.  Musculoskeletal: She exhibits no edema or tenderness.  Neurological: She is somnolent, but is able to answer orientation questions appropriately.  Has poor postural awareness.  Right facial weakness with dysarthric speech  Exam limited by poor participation.  Motor: LUE: 4/5 proximal to distal LLE: 4+/5 proximal to distal  RLE: 0/5 (likely secondary to poor initiation - previously 2/5 hip flexion, 4-/5 ankle dorsi/plantar flexion) RUE: 0/5  Right facial weakness Skin: Skin is warm and dry. No erythema.  Psychiatric: Her affect is blunt. Her speech is delayed. She is slowed. Cognition and memory are impaired. She expresses inappropriate  judgment.  Assessment/Plan: 1. 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 Physiatrist is providing close team supervision and 24 hour management of active medical problems listed below. Physiatrist and rehab team continue to assess barriers to discharge/monitor patient progress toward functional and medical goals.  Function:  Bathing Bathing position   Position: Wheelchair/chair at sink  Bathing parts Body parts bathed by patient: Chest, Abdomen, Right upper leg, Left upper leg Body parts bathed by helper: Right arm, Left arm, Buttocks, Right lower leg, Left lower leg, Back  Bathing assist Assist Level: 2 helpers      Upper Body Dressing/Undressing Upper body dressing Upper body dressing/undressing activity did not occur: Refused                  Upper body assist        Lower Body Dressing/Undressing Lower body dressing   What is the patient wearing?: Pants, Non-skid slipper socks       Pants- Performed by helper: Thread/unthread right pants leg, Thread/unthread left pants leg, Pull pants up/down   Non-skid slipper socks- Performed by helper: Don/doff right sock, Don/doff left sock                  Lower body assist Assist for lower body dressing: 2 Automotive engineer activity  did not occur: N/A Toileting steps completed by patient: Performs perineal hygiene Toileting steps completed by helper: Adjust clothing prior to toileting, Adjust clothing after toileting Toileting Assistive Devices: Other (comment) (stedy)  Toileting assist Assist level: Touching or steadying assistance (Pt.75%)   Transfers Chair/bed transfer   Chair/bed transfer method: Squat pivot Chair/bed transfer assist level: Moderate assist (Pt 50 - 74%/lift or lower) Chair/bed transfer assistive device: Mechanical  lift Mechanical lift: Stedy   Locomotion Ambulation Ambulation activity did not occur: Refused         Wheelchair   Type: Manual Max wheelchair distance: 5 Assist Level: Maximal assistance (Pt 25 - 49%)  Cognition Comprehension Comprehension assist level: Understands basic 90% of the time/cues < 10% of the time  Expression Expression assist level: Expresses basic 50 - 74% of the time/requires cueing 25 - 49% of the time. Needs to repeat parts of sentences.  Social Interaction Social Interaction assist level: Interacts appropriately 50 - 74% of the time - May be physically or verbally inappropriate.  Problem Solving Problem solving assist level: Solves basic 50 - 74% of the time/requires cueing 25 - 49% of the time  Memory Memory assist level: Recognizes or recalls 25 - 49% of the time/requires cueing 50 - 75% of the time    Medical Problem List and Plan: 1. Dysphagia, Aphasia,right  hemiparesis secondary to Left subthalamic lacunar infarct 2. DVT Prophylaxis/Anticoagulation: Pharmaceutical: Lovenox 3. Chronic back pain/ Pain Management:lumbar stenosis and scoliosis , severe  On lyrica bid. Off OxyContin and ultram at this time. Will usetylenol prn to prevent sedation.  4. Mood: LCSW to follow for evaluation and support.  5. Neuropsych: This patient is not capable of making decisions on her own behalf. 6. Skin/Wound Care: Maintain adequate nutrition and hydration status. Routine pressure relief measures.  7. Fluids/Electrolytes/Nutrition: Monitor I/O. Offer supplements if intake poor. Protein supplement as.   Repeat labs pending, will review  8. COPD: Continue IS. On dulera bid.  9. OSA/OHS: Question use of CPAP at bedtime  Will follow up-Resp Tx is following 10. Dyslipidemia: Continue Lipitor and fenofibrate. 11. DM type 2: Monitor BS ac/hs.   CBG 217 this am, now on lantus (BID dosing at home), increase to 20 U 12. HTN: Monitor and slowly resume home medications.    Avoid hypotension to allow for adequate perfusion. Stable in present 13. CKD stage 4: Baseline creatinine at 2.7 per records review- slightly higher than baseline currently  Continue to Monitor with serial checks. Encourage po intake.   LOS (Days) 4 A FACE TO FACE EVALUATION WAS PERFORMED  KIRSTEINS,ANDREW E 01/22/2015 8:09 AM

## 2015-01-23 ENCOUNTER — Inpatient Hospital Stay (HOSPITAL_COMMUNITY): Payer: Medicare Other | Admitting: Physical Therapy

## 2015-01-23 ENCOUNTER — Inpatient Hospital Stay (HOSPITAL_COMMUNITY): Payer: Medicare Other | Admitting: Occupational Therapy

## 2015-01-23 ENCOUNTER — Inpatient Hospital Stay (HOSPITAL_COMMUNITY): Payer: Medicare Other | Admitting: Speech Pathology

## 2015-01-23 DIAGNOSIS — M4806 Spinal stenosis, lumbar region: Secondary | ICD-10-CM

## 2015-01-23 LAB — GLUCOSE, CAPILLARY
Glucose-Capillary: 235 mg/dL — ABNORMAL HIGH (ref 65–99)
Glucose-Capillary: 227 mg/dL — ABNORMAL HIGH (ref 65–99)
Glucose-Capillary: 208 mg/dL — ABNORMAL HIGH (ref 65–99)
Glucose-Capillary: 155 mg/dL — ABNORMAL HIGH (ref 65–99)

## 2015-01-23 MED ORDER — INSULIN GLARGINE 100 UNIT/ML ~~LOC~~ SOLN
30.0000 [IU] | Freq: Every day | SUBCUTANEOUS | Status: DC
  Administered 2015-01-23 – 2015-01-24 (×2): 30 [IU] via SUBCUTANEOUS
  Filled 2015-01-23 (×2): qty 0.3

## 2015-01-23 NOTE — Progress Notes (Signed)
Occupational Therapy Session Note  Patient Details  Name: Michelle Holmes MRN: [id number] Date of Birth: 1950-07-09  Today's Date: 01/23/2015 OT Individual Time: 0935-1030 OT Individual Time Calculation (min): 55 min    Short Term Goals: Week 1:  OT Short Term Goal 1 (Week 1): Pt. will demonstrated safety with eating by eating slowly OT Short Term Goal 2 (Week 1): Pt will balance in stting EOB for 20 minutes with SBA OT Short Term Goal 3 (Week 1): Pt will bath self UB with mod assist OT Short Term Goal 4 (Week 1): Pt will bathe LB with max assist. OT Short Term Goal 5 (Week 1): Pt will transfer to 3n1 with mod assist.    Skilled Therapeutic Interventions/Progress Updates:    Treatment session with focus on ADL retraining, transfers, sit > stand, dressing techniques, and RUE NMR.  Pt in bed upon arrival requesting to get dressed, but refusing bathing.  While therapist gathering clothing, pt reports need to toilet.  Performed transfer to toilet with [facility] with pt requiring mod assist sit > stand with UE support on Stedy and +2 with transfer into bathroom due to incline over threshold and pt tending to lean Rt in [place].  Pt completed up/down on/off toilet with light mod assist for anterior weight shift and lift off from toilet while pulling up on Stedy bar.  Therapist provided assist with clothing management and hygiene, +2 for safety in standing.  Total assist for dressing tasks due to impaired participation and decreased motivation.  Engaged in [place] with focus on shoulder activation, attempted towel glides with hand over hand with pt demonstrating decreased motivation and participation.  Therapy Documentation Precautions:  Precautions Precautions: Fall Precaution Comments: R hemiparesis Restrictions Weight Bearing Restrictions: No Pain: Pain Assessment Pain Assessment: No/denies pain  See Function Navigator for Current Functional Status.   Therapy/Group: Individual Therapy  [name] 01/23/2015, 12:12 PM

## 2015-01-23 NOTE — Progress Notes (Signed)
Patient with complaints of soreness and pain to chest. Rate 8. Patient not sure of gas pain. Vital signs T 98.2 BP 149/67 P 63 PO  99%. Patient given Tylenol and Trazodone. Patient rested quietly in bed with no pain. Continued to assess during shift for chest pain. adm

## 2015-01-23 NOTE — Progress Notes (Signed)
Physical Therapy Session Note  Patient Details  Name: Michelle Holmes MRN: [id number] Date of Birth: 27-Dec-1950  Today's Date: 01/23/2015 PT Individual Time: 1430-1500 PT Individual Time Calculation (min): 30 min   Short Term Goals: Week 1:  PT Short Term Goal 1 (Week 1): Pt will transition supine>sit using hospital bed functions with supervision PT Short Term Goal 2 (Week 1): Pt will perform sit<>stand with LRAD and max assist  PT Short Term Goal 3 (Week 1): Pt will initiate gait training with PT and LRAD PT Short Term Goal 4 (Week 1): Pt will propel w/c 47' with mod assist  Skilled Therapeutic Interventions/Progress Updates:    Pt received up in w/c with quick release belt in place - refusing to attempt self propulsion of manual w/c. Pt brought into gym. PT instructs pt in mod A sit to stand from w/c using stedy, then min A multiple sit to stands from seat of stedy throughout session, as pt verbally refuses any activity in stand (stringing beads on shoelace, reaching for cones, playing cards, checkers, or connect 4), except placing arms on elevated tray table and then back on stedy (x 3 reps total). Pt easily agitated and tells PT "I wish you would just Shut Up, telling me to stand up" after ~15 sit to stands from seat of Stedy, working on upright posture, forced weightbearing through R LE, and approximation of R UE through weight-bearing. Pt brought back to room and quick release belt in place - awaiting speech therapy.   Therapy Documentation Precautions:  Precautions Precautions: Fall Precaution Comments: R hemiparesis Restrictions Weight Bearing Restrictions: No  Pain: Pain Assessment Pain Assessment: No/denies pain   See Function Navigator for Current Functional Status.   Therapy/Group: Individual Therapy  Kareli Hossain M 01/23/2015, 2:52 PM

## 2015-01-23 NOTE — Progress Notes (Signed)
Speech Language Pathology Daily Session Note  Patient Details  Name: Michelle Holmes MRN: [id number] Date of Birth: 1950/10/31  Today's Date: 01/23/2015 SLP Individual Time: 1500-1530 SLP Individual Time Calculation (min): 30 min  Short Term Goals: Week 1: SLP Short Term Goal 1 (Week 1): Pt will consume dys 3 textures and thin liquids with min assist verbal cues to monitor and correct oral residue and rate of self feeding over 3 targeted sessions.  SLP Short Term Goal 2 (Week 1): Pt will sustain her attention to basic familiar tasks for 3-5 minutes with mod verbal cues for redirection.  SLP Short Term Goal 3 (Week 1): Pt will be intelligible at the phrase level in 75% of opportunities with max assist verbal cues.    Skilled Therapeutic Interventions:  Pt was seen for skilled ST targeting cognitive goals.  SLP facilitated the session with a basic matching game targeting sustained attention to task.  Pt was able to sustain her attention to the abovementioned task for ~2 minute intervals with overall max faded to mod assist verbal cues for redirection in a minimally distracting environment.  Pt was returned to room and transferred back to bed at the end of today's therapy session with assistance from nurse tech.   Continue per current plan of care.    Function:  Eating Eating                 Cognition Comprehension Comprehension assist level: Understands complex 90% of the time/cues 10% of the time  Expression   Expression assist level: Expresses basic 50 - 74% of the time/requires cueing 25 - 49% of the time. Needs to repeat parts of sentences.  Social Interaction Social Interaction assist level: Interacts appropriately 50 - 74% of the time - May be physically or verbally inappropriate.  Problem Solving Problem solving assist level: Solves basic 50 - 74% of the time/requires cueing 25 - 49% of the time  Memory Memory assist level: Recognizes or recalls 25 - 49% of the time/requires cueing  50 - 75% of the time    Pain Pain Assessment Pain Assessment: No/denies pain  Therapy/Group: Individual Therapy  Cedrik Heindl, Selinda Orion 01/23/2015, 3:29 PM

## 2015-01-23 NOTE — Progress Notes (Signed)
Speech Language Pathology Daily Session Note  Patient Details  Name: Michelle Holmes MRN: [id number] Date of Birth: 04-24-1950  Today's Date: 01/23/2015 SLP Individual Time: 0802-0901 SLP Individual Time Calculation (min): 59 min  Short Term Goals: Week 1: SLP Short Term Goal 1 (Week 1): Pt will consume dys 3 textures and thin liquids with min assist verbal cues to monitor and correct oral residue and rate of self feeding over 3 targeted sessions.  SLP Short Term Goal 2 (Week 1): Pt will sustain her attention to basic familiar tasks for 3-5 minutes with mod verbal cues for redirection.  SLP Short Term Goal 3 (Week 1): Pt will be intelligible at the phrase level in 75% of opportunities with max assist verbal cues.    Skilled Therapeutic Interventions:  Pt was seen for skilled ST targeting goals for dysphagia and cognition.  Pt consumed dys 3 textures and thin liquids with x1 min assist verbal cue to monitor and correct anterior loss of boluses.  No overt s/s of aspiration were evident with solids or liquids and pt cleared residue from the oral cavity with extra time and self initiated use of liquid wash.  Pt sustained her attention to meal for 30 minutes with supervision cues for redirection.  Pt was also able to sequence and organize basic self care tasks at bed level with min assist verbal cues and set up assist.  Pt had been incontinent prior to SLP's arrival but did not make therapist aware until the end of today's therapy session due to poor awareness and task initiation.  Pt was changed into a clean brief and left in bed with call bell within reach and bed alarm activated.  Continue per current plan of care.    Function:  Eating Eating   Modified Consistency Diet: Yes Eating Assist Level: Helper checks for pocketed food;Set up assist for;Supervision or verbal cues   Eating Set Up Assist For: Opening containers       Cognition Comprehension Comprehension assist level: Understands complex  90% of the time/cues 10% of the time  Expression   Expression assist level: Expresses basic 50 - 74% of the time/requires cueing 25 - 49% of the time. Needs to repeat parts of sentences.  Social Interaction Social Interaction assist level: Interacts appropriately 50 - 74% of the time - May be physically or verbally inappropriate.  Problem Solving Problem solving assist level: Solves basic 50 - 74% of the time/requires cueing 25 - 49% of the time  Memory Memory assist level: Recognizes or recalls 25 - 49% of the time/requires cueing 50 - 75% of the time    Pain Pain Assessment Pain Assessment: No/denies pain  Therapy/Group: Individual Therapy  Shyann Hefner, Selinda Orion 01/23/2015, 11:16 AM

## 2015-01-23 NOTE — Progress Notes (Addendum)
[place] PHYSICAL MEDICINE & REHABILITATION     PROGRESS NOTE    Subjective/Complaints:   Patient states her bowels are doing well. Discussed with nursing she is continent. Patient is also continent of bladder. She feels like she is ready to go home. She took Lantus insulin 12 units twice a day at home.  ROS: Denies CP, SOB, N/V/D  Objective: Vital Signs: Blood pressure 109/59, pulse 74, temperature 97.3 F (36.3 C), temperature source Oral, resp. rate 19, weight 100.3 kg (221 lb 1.9 oz), SpO2 97 %. No results found.  Recent Labs  01/21/15 1134  WBC 6.6  HGB 10.7*  HCT 33.3*  PLT 148*    Recent Labs  01/21/15 1134  NA 144  K 5.0  CL 115*  GLUCOSE 234*  BUN 48*  CREATININE 3.06*  CALCIUM 9.3   CBG (last 3)   Recent Labs  01/22/15 1206 01/22/15 1651 01/22/15 2115  GLUCAP 207* 292* 282*    Wt Readings from Last 3 Encounters:  01/23/15 100.3 kg (221 lb 1.9 oz)  01/14/15 103.193 kg (227 lb 8 oz)  01/12/15 102.513 kg (226 lb)    Physical Exam:  BP 109/59 mmHg  Pulse 74  Temp(Src) 97.3 F (36.3 C) (Oral)  Resp 19  Wt 100.3 kg (221 lb 1.9 oz)  SpO2 97% Constitutional: She appears well-developed and well-nourished. NAD HENT: Normocephalic and atraumatic.  Eyes: Conjunctivae and EOM are normal.  Cardiovascular: Normal rate and regular rhythm.  Respiratory: Effort normal and breath sounds normal. No respiratory distress. She has no wheezes.  GI: Soft. Bowel sounds are normal. There is no tenderness.  Musculoskeletal: She exhibits no edema or tenderness.  Neurological: She is somnolent, but is able to answer orientation questions appropriately.  Has poor postural awareness.  Right facial weakness with dysarthric speech , positive aphasia both receptive and expressive Exam limited by poor participation.  Motor: LUE: 4/5 proximal to distal LLE: 4+/5 proximal to distal  RLE: 0/5 (likely secondary to poor initiation ) RUE: 0/5  Right facial  weakness  Skin: Skin is warm and dry. No erythema.  Psychiatric: Her affect is blunt. Her speech is delayed. She is slowed. Cognition and memory are impaired. She expresses inappropriate judgment.  Assessment/Plan: 1. 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 Physiatrist is providing close team supervision and 24 hour management of active medical problems listed below. Physiatrist and rehab team continue to assess barriers to discharge/monitor patient progress toward functional and medical goals.  Function:  Bathing Bathing position   Position: Wheelchair/chair at sink  Bathing parts Body parts bathed by patient: Chest, Abdomen, Right upper leg, Left upper leg Body parts bathed by helper: Right arm, Left arm, Buttocks, Right lower leg, Left lower leg, Back  Bathing assist Assist Level: 2 helpers      Upper Body Dressing/Undressing Upper body dressing Upper body dressing/undressing activity did not occur: Refused                  Upper body assist        Lower Body Dressing/Undressing Lower body dressing   What is the patient wearing?: Pants, Non-skid slipper socks       Pants- Performed by helper: Thread/unthread right pants leg, Thread/unthread left pants leg, Pull pants up/down   Non-skid slipper socks- Performed by helper: Don/doff right sock, Don/doff left sock  Lower body assist Assist for lower body dressing: 2 Helpers      Toileting Toileting Toileting activity did not occur: N/A Toileting steps completed by patient: Performs perineal hygiene Toileting steps completed by helper: Adjust clothing prior to toileting, Adjust clothing after toileting Toileting Assistive Devices:  Charlaine Dalton)  Toileting assist Assist level: Two helpers   Transfers Chair/bed transfer   Chair/bed transfer  method: Squat pivot Chair/bed transfer assist level: Moderate assist (Pt 50 - 74%/lift or lower) Chair/bed transfer assistive device: Mechanical lift Mechanical lift: Stedy   Locomotion Ambulation Ambulation activity did not occur: Refused         Wheelchair   Type: Manual Max wheelchair distance: 5 Assist Level: Maximal assistance (Pt 25 - 49%)  Cognition Comprehension Comprehension assist level: Understands complex 90% of the time/cues 10% of the time  Expression Expression assist level: Expresses basic 50 - 74% of the time/requires cueing 25 - 49% of the time. Needs to repeat parts of sentences.  Social Interaction Social Interaction assist level: Interacts appropriately 50 - 74% of the time - May be physically or verbally inappropriate.  Problem Solving Problem solving assist level: Solves basic 50 - 74% of the time/requires cueing 25 - 49% of the time  Memory Memory assist level: Recognizes or recalls 25 - 49% of the time/requires cueing 50 - 75% of the time    Medical Problem List and Plan: 1. Dysphagia, Aphasia,right  hemiparesis secondary to Left subthalamic lacunar infarct-Team conference today please see physician documentation under team conference tab, met with team face-to-face to discuss problems,progress, and goals. Formulized individual treatment plan based on medical history, underlying problem and comorbidities. 2. DVT Prophylaxis/Anticoagulation: Pharmaceutical: Lovenox 3. Chronic back pain/ Pain Management:lumbar stenosis and scoliosis , severe,   On lyrica bid. Off OxyContin and ultram at this time.has when necessary Norco Will usetylenol prn to prevent sedation.  4. Mood: LCSW to follow for evaluation and support.  5. Neuropsych: This patient is not capable of making decisions on her own behalf. 6. Skin/Wound Care: Maintain adequate nutrition and hydration status. Routine pressure relief measures.  7. Fluids/Electrolytes/Nutrition: Monitor I/O. Offer  supplements if intake poor. Protein supplement as.   Repeat labs pending, will review  8. COPD: Continue IS. On dulera bid.  9. OSA/OHS: Question use of CPAP at bedtime   10. Dyslipidemia: Continue Lipitor and fenofibrate. 11. DM type 2: Monitor BS ac/hs.   CBGpending this morning, increased lantus to 20 U on 12/27, am CBG pending may need to add Lantus 10U in am 12. HTN: Monitor and slowly resume home medications.   On low side this am but generally running good range. Stable in present 13. CKD stage 4: Baseline creatinine at 2.7 per records review- slightly higher than baseline currently, recheck BMET, enc po fluids  Continue to Monitor with serial checks. Encourage po intake.   LOS (Days) 5 A FACE TO FACE EVALUATION WAS PERFORMED  Alysia Penna E 01/23/2015 8:45 AM

## 2015-01-23 NOTE — Progress Notes (Signed)
Social Work Patient ID: Michelle Holmes, female   DOB: 1950-04-24, 64 y.o.   MRN: [id number] Spoke with pt and also daughter-Denise via telephone to discuss team conference goals-min assist level and the need for 24 hr physical care at discharge. Her target date is 1/13.  She reports between herself, other sister and grandson who stays with her they will provide 24 hr care. She will be the one to come in for training. Asked her to come in and observe in therapies end of the week or beginning of next week to see her in therapies, to see if she can motivate pt to participate more in therapies. Will see if she comes in, but they are committed to providing care to pt at home.

## 2015-01-23 NOTE — Patient Care Conference (Signed)
Inpatient RehabilitationTeam Conference and Plan of Care Update Date: 01/23/2015   Time: 11:30 AM    Patient Name: Michelle Holmes      Medical Record Number: [id number]  Date of Birth: 1950-02-12 Sex: Female         Room/Bed: 4W23C/4W23C-01 Payor Info: Payor: [employer] MEDICARE / Plan: [facility] MEDICARE / Product Type: *No Product type* /    Admitting Diagnosis: L CVA2  Admit Date/Time:  01/18/2015  4:52 PM Admission Comments: No comment available   Primary Diagnosis:  <principal problem not specified> Principal Problem: <principal problem not specified>  Patient Active Problem List   Diagnosis Date Noted  . Acute ischemic VBA thalamic stroke ([place]) 01/18/2015  . AKI (acute kidney injury) ([place])   . Dysarthria   . Lethargy   . DM type 2 with diabetic peripheral neuropathy ([place])   . Labile blood pressure   . Hyperlipidemia   . History of CVA with residual deficit   . Chronic obstructive pulmonary disease ([place])   . Hemiparesis, aphasia, and dysphagia as late effect of cerebrovascular accident (CVA) ([place])   . Acute ischemic stroke ([place])   . CVA (cerebral vascular accident) ([place]) 01/14/2015  . CVA (cerebral infarction) 01/14/2015  . Right hemiplegia ([place])   . Right sided weakness 01/13/2015  . Abdominal pain 06/20/2014  . Altered mental status 02/08/2012  . Sleep apnea 12/08/2010  . Morbid obesity ([place]) 12/08/2010  . CHF (congestive heart failure) ([place]) 12/08/2010  . Chronic renal failure 12/08/2010  . TIA (transient ischemic attack) 12/08/2010  . CAD (coronary artery disease) 12/08/2010  . Cellulitis of pubic region 12/01/2010  . Uncontrolled type 2 DM with hyperosmolar nonketotic hyperglycemia ([place]) 12/01/2010    Expected Discharge Date: Expected Discharge Date: 02/08/15  Team Members Present: Physician leading conference: Dr. Alysia Penna Social Worker Present: Ovidio Kin, LCSW Nurse Present: Dorien Chihuahua, RN PT Present: Jorge Mandril, PT OT Present: [name], OT SLP Present: Windell Moulding, SLP PPS Coordinator present : Daiva Nakayama, RN, CRRN     Current Status/Progress Goal Weekly Team Focus  Medical   incont bowel and bladder, some urgency, uncontrolled diabetes  normoglycenmic.  Achieve bowel cont  med managment, timed toileting   Bowel/Bladder   continent bowel and bladder thus far . LBM 12/26  mod I   monitor continent episodes   Swallow/Nutrition/ Hydration   Dys 3, thin liquids; full supervision needed due to cognition    supervision   toleration of currently prescribed diet, trials of advanced consistencies   ADL's   total assist bathing and dressing, +2 transfers with [facility] - however pt demonstrating initiation and min assist for sit > stand with grab bar on Stedy, decreased motivation and participation in treatment sessions  min assist overall, mod assist shower transfer  participation in treatment sessions, ADL retraining, transfers, RUE NMR   Mobility   mod -max assist with all fucntional mobility, +2 ambulation   min assist overall  functional mobility, strengthening, endurance, education and insight into deficits.    Communication   Moderate dysarthria   min assist   address speech intelligibility at the sentence level    Safety/Cognition/ Behavioral Observations  significant deficits, ? baseline mentation, family not present to verify   min assist   participation in therapies, sustained attention to tasks, emergent awareness of deficits    Pain   n/a         Skin   dry scratches to buttocks and legs. eucerin cream scheduled  min assist   educate patient and family on skin care .      *See Care Plan and progress notes for long and short-term goals.  Barriers to Discharge: see above    Possible Resolutions to Barriers:  see above    Discharge Planning/Teaching Needs:  Family to provide 24 hr care upon discharge-was providing supervision prior to admission      Team Discussion:  Goals min assist level-motivation  a limiting factor in her participation in therapies. Memory poor and poor awareness of her deficits. Incont B & B-unless in the room-timed toileting begun. See if daughter will come in and see if she can motivate Mom in therapies.  Revisions to Treatment Plan:  None   Continued Need for Acute Rehabilitation Level of Care: The patient requires daily medical management by a physician with specialized training in physical medicine and rehabilitation for the following conditions: Daily direction of a multidisciplinary physical rehabilitation program to ensure safe treatment while eliciting the highest outcome that is of practical value to the patient.: Yes Daily medical management of patient stability for increased activity during participation in an intensive rehabilitation regime.: Yes Daily analysis of laboratory values and/or radiology reports with any subsequent need for medication adjustment of medical intervention for : Neurological problems;Other  Elease Hashimoto 01/24/2015, 8:37 AM

## 2015-01-23 NOTE — Progress Notes (Signed)
Physical Therapy Session Note  Patient Details  Name: Michelle Holmes MRN: [id number] Date of Birth: 01/30/1950  Today's Date: 01/23/2015 PT Individual Time: 1129-1159 PT Individual Time Calculation (min): 30 min   Short Term Goals: Week 1:  PT Short Term Goal 1 (Week 1): Pt will transition supine>sit using hospital bed functions with supervision PT Short Term Goal 2 (Week 1): Pt will perform sit<>stand with LRAD and max assist  PT Short Term Goal 3 (Week 1): Pt will initiate gait training with PT and LRAD PT Short Term Goal 4 (Week 1): Pt will propel w/c 26' with mod assist    Therapy Documentation Precautions:  Precautions Precautions: Fall Precaution Comments: R hemiparesis Restrictions Weight Bearing Restrictions: No Pain: Pain Assessment Pain Assessment: No/denies pain   Sit to and from stand transfer at left handrail mod assist  Patient stood 2 and half minutes at left handrail with emphasis on upright posture, positioning in midline and even weight distribution. Max assist.  Patient ambulated 5 feetx2 and 10 feet with left handrail and max assistx1; +2 for wheelchair follow and safety. Patient ambulated with a step to pattern. Patient required total assist for advancement, placement and stabilization of RLE. Patient Verbal cues required throughout. Educated on proper sequence and technique.  Seated there ex: B LAQ 3x10 AROM on Left and PROM on the right.  Patient tolerated tx well with max encouragement for participation. Patient required rest breaks throughout session. Patient returned to room at end of session with all needs met, call bell and phone within reach and quick release belt engaged. Patient was without complaints and end of session and all needs met prior to exit.   See Function Navigator for Current Functional Status.   Therapy/Group: Individual Therapy  Retta Diones 01/23/2015, 1:24 PM

## 2015-01-24 ENCOUNTER — Inpatient Hospital Stay (HOSPITAL_COMMUNITY): Payer: Medicare Other | Admitting: Physical Therapy

## 2015-01-24 ENCOUNTER — Inpatient Hospital Stay (HOSPITAL_COMMUNITY): Payer: Medicare Other | Admitting: Speech Pathology

## 2015-01-24 ENCOUNTER — Inpatient Hospital Stay (HOSPITAL_COMMUNITY): Payer: Medicare Other | Admitting: Occupational Therapy

## 2015-01-24 LAB — GLUCOSE, CAPILLARY
Glucose-Capillary: 160 mg/dL — ABNORMAL HIGH (ref 65–99)
Glucose-Capillary: 211 mg/dL — ABNORMAL HIGH (ref 65–99)
Glucose-Capillary: 194 mg/dL — ABNORMAL HIGH (ref 65–99)
Glucose-Capillary: 163 mg/dL — ABNORMAL HIGH (ref 65–99)

## 2015-01-24 NOTE — Progress Notes (Signed)
Speech Language Pathology Daily Session Note  Patient Details  Name: Michelle Holmes MRN: [id number] Date of Birth: 1950-12-03  Today's Date: 01/24/2015 SLP Individual Time: 1300-1330 SLP Individual Time Calculation (min): 30 min  Short Term Goals: Week 1: SLP Short Term Goal 1 (Week 1): Pt will consume dys 3 textures and thin liquids with min assist verbal cues to monitor and correct oral residue and rate of self feeding over 3 targeted sessions.  SLP Short Term Goal 2 (Week 1): Pt will sustain her attention to basic familiar tasks for 3-5 minutes with mod verbal cues for redirection.  SLP Short Term Goal 3 (Week 1): Pt will be intelligible at the phrase level in 75% of opportunities with max assist verbal cues.    Skilled Therapeutic Interventions:  Pt was seen for skilled ST targeting cognitive goals.  SLP facilitated the session with a sorting task targeting sustained attention to task.  Pt sorted bean bags into 6 groups by color with overall mod assist verbal and visual cues to recognize and correct errors.  Pt sustained her attention to task for ~20 minutes with min verbal cues for redirection.  Pt initially required encouragement to participate in therapies; however, pt's participation in therapy significantly improved while listening to familiar music.  Pt was returned to room and left in wheelchair with quick release belt donned and call bell within reach.  Continue per current plan of care.    Function:  Eating Eating   Modified Consistency Diet: Yes Eating Assist Level: Set up assist for;Supervision or verbal cues;Helper checks for pocketed food   Eating Set Up Assist For: Opening containers       Cognition Comprehension Comprehension assist level: Follows basic conversation/direction with no assist  Expression   Expression assist level: Expresses basic 50 - 74% of the time/requires cueing 25 - 49% of the time. Needs to repeat parts of sentences.  Social Interaction Social  Interaction assist level: Interacts appropriately 75 - 89% of the time - Needs redirection for appropriate language or to initiate interaction.  Problem Solving Problem solving assist level: Solves basic 25 - 49% of the time - needs direction more than half the time to initiate, plan or complete simple activities  Memory Memory assist level: Recognizes or recalls 25 - 49% of the time/requires cueing 50 - 75% of the time    Pain Pain Assessment Pain Assessment: No/denies pain Pain Score: 0-No pain  Therapy/Group: Individual Therapy  Council Munguia, Selinda Orion 01/24/2015, 1:21 PM

## 2015-01-24 NOTE — Progress Notes (Signed)
Speech Language Pathology Daily Session Note  Patient Details  Name: Michelle Holmes MRN: [id number] Date of Birth: 1950-06-11  Today's Date: 01/24/2015 SLP Individual Time: 0902-1002 SLP Individual Time Calculation (min): 60 min  Short Term Goals: Week 1: SLP Short Term Goal 1 (Week 1): Pt will consume dys 3 textures and thin liquids with min assist verbal cues to monitor and correct oral residue and rate of self feeding over 3 targeted sessions.  SLP Short Term Goal 2 (Week 1): Pt will sustain her attention to basic familiar tasks for 3-5 minutes with mod verbal cues for redirection.  SLP Short Term Goal 3 (Week 1): Pt will be intelligible at the phrase level in 75% of opportunities with max assist verbal cues.    Skilled Therapeutic Interventions:  Pt was seen for skilled ST targeting goals for dysphagia and cognition.   Pt utilized the call bell to request assistance for getting out of bed with mod assist verbal and visual cues.  Pt was transferred to wheelchair with assistance from nurse tech and [facility] lift.  Pt was able to clear solids from the oral cavity while consuming her currently prescribed diet during breakfast with extra time and supervision cues.  Pt demonstrated no overt s/s of aspiration with solids or liquids.  Following completion of meal, pt was able to complete basic sinkside ADLs (brushing teeth and washing face) with overall min assist verbal cues for sequencing.  Pt sustained attention to basic self care tasks for 3-5 minute intervals with supervision cues for redirection.  Pt was left in wheelchair with call bell within reach and quick release belt donned for safety.  Continue per current plan of care.    Function:  Eating Eating   Modified Consistency Diet: Yes Eating Assist Level: Set up assist for;Supervision or verbal cues;Helper checks for pocketed food   Eating Set Up Assist For: Opening containers       Cognition Comprehension Comprehension assist level:  Follows basic conversation/direction with no assist  Expression   Expression assist level: Expresses basic 50 - 74% of the time/requires cueing 25 - 49% of the time. Needs to repeat parts of sentences.  Social Interaction Social Interaction assist level: Interacts appropriately 75 - 89% of the time - Needs redirection for appropriate language or to initiate interaction.  Problem Solving Problem solving assist level: Solves basic 25 - 49% of the time - needs direction more than half the time to initiate, plan or complete simple activities  Memory Memory assist level: Recognizes or recalls 25 - 49% of the time/requires cueing 50 - 75% of the time    Pain Pain Assessment Pain Assessment: No/denies pain Pain Score: 0-No pain  Therapy/Group: Individual Therapy  Bjorn Hallas, Selinda Orion 01/24/2015, 12:35 PM

## 2015-01-24 NOTE — Progress Notes (Signed)
[place] PHYSICAL MEDICINE & REHABILITATION     PROGRESS NOTE    Subjective/Complaints:  "I want to go home"  We discussed d/c date and severity of stroke related deficits, still requiring 2 person assist for mob  ROS: Denies CP, SOB, N/V/D  Objective: Vital Signs: Blood pressure 122/63, pulse 72, temperature 98.3 F (36.8 C), temperature source Oral, resp. rate 18, weight 100 kg (220 lb 7.4 oz), SpO2 92 %. No results found.  Recent Labs  01/21/15 1134  WBC 6.6  HGB 10.7*  HCT 33.3*  PLT 148*    Recent Labs  01/21/15 1134  NA 144  K 5.0  CL 115*  GLUCOSE 234*  BUN 48*  CREATININE 3.06*  CALCIUM 9.3   CBG (last 3)   Recent Labs  01/23/15 1652 01/23/15 2058 01/24/15 0651  GLUCAP 208* 155* 160*    Wt Readings from Last 3 Encounters:  01/24/15 100 kg (220 lb 7.4 oz)  01/14/15 103.193 kg (227 lb 8 oz)  01/12/15 102.513 kg (226 lb)    Physical Exam:  BP 122/63 mmHg  Pulse 72  Temp(Src) 98.3 F (36.8 C) (Oral)  Resp 18  Wt 100 kg (220 lb 7.4 oz)  SpO2 92% Constitutional: She appears well-developed and well-nourished. NAD HENT: Normocephalic and atraumatic.  Eyes: Conjunctivae and EOM are normal.  Cardiovascular: Normal rate and regular rhythm.  Respiratory: Effort normal and breath sounds normal. No respiratory distress. She has no wheezes.  GI: Soft. Bowel sounds are normal. There is no tenderness.  Musculoskeletal: She exhibits no edema or tenderness.  Neurological: She is somnolent, but is able to answer orientation questions appropriately.  Has poor postural awareness.  Right facial weakness with dysarthric speech , positive aphasia both receptive and expressive Exam limited by poor participation.  Motor: LUE: 4/5 proximal to distal LLE: 4+/5 proximal to distal  RLE: 2-/5  RUE: 0/5 except trace grip Right facial weakness  Skin: Skin is warm and dry. No erythema.  Psychiatric: Her affect is blunt. Her speech is delayed. She is slowed.  Cognition and memory are impaired. She expresses inappropriate judgment.  Assessment/Plan: 1. 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 Physiatrist is providing close team supervision and 24 hour management of active medical problems listed below. Physiatrist and rehab team continue to assess barriers to discharge/monitor patient progress toward functional and medical goals.  Function:  Bathing Bathing position Bathing activity did not occur: Refused Position: Wheelchair/chair at sink  Bathing parts Body parts bathed by patient: Chest, Abdomen, Right upper leg, Left upper leg Body parts bathed by helper: Right arm, Left arm, Buttocks, Right lower leg, Left lower leg, Back  Bathing assist Assist Level: 2 helpers      Upper Body Dressing/Undressing Upper body dressing Upper body dressing/undressing activity did not occur: Refused What is the patient wearing?: Pull over shirt/dress     Pull over shirt/dress - Perfomed by patient: Pull shirt over trunk Pull over shirt/dress - Perfomed by helper: Thread/unthread right sleeve, Thread/unthread left sleeve, Put head through opening        Upper body assist Assist Level:  (Max assist)      Lower Body Dressing/Undressing Lower body dressing   What is the patient wearing?: Pants, Non-skid slipper socks       Pants- Performed by helper: Thread/unthread right pants leg, Thread/unthread left pants leg, Pull pants up/down  Non-skid slipper socks- Performed by helper: Don/doff right sock, Don/doff left sock                  Lower body assist Assist for lower body dressing: 2 Helpers      Toileting Toileting Toileting activity did not occur: N/A Toileting steps completed by patient: Performs perineal hygiene Toileting steps completed by helper: Adjust clothing prior to  toileting, Adjust clothing after toileting, Performs perineal hygiene Toileting Assistive Devices:  Charlaine Dalton)  Toileting assist Assist level: Two helpers   Transfers Chair/bed transfer   Chair/bed transfer method: Squat pivot Chair/bed transfer assist level: Moderate assist (Pt 50 - 74%/lift or lower) Chair/bed transfer assistive device: Mechanical lift Mechanical lift: Stedy   Locomotion Ambulation Ambulation activity did not occur: Scientist, research (physical sciences) activity did not occur: Refused Type: Manual Max wheelchair distance: 5 Assist Level: Maximal assistance (Pt 25 - 49%)  Cognition Comprehension Comprehension assist level: Understands complex 90% of the time/cues 10% of the time  Expression Expression assist level: Expresses basic 50 - 74% of the time/requires cueing 25 - 49% of the time. Needs to repeat parts of sentences.  Social Interaction Social Interaction assist level: Interacts appropriately 50 - 74% of the time - May be physically or verbally inappropriate.  Problem Solving Problem solving assist level: Solves basic 25 - 49% of the time - needs direction more than half the time to initiate, plan or complete simple activities  Memory Memory assist level: Recognizes or recalls 25 - 49% of the time/requires cueing 50 - 75% of the time    Medical Problem List and Plan: 1. Dysphagia, Aphasia,right  hemiparesis secondary to Left subthalamic lacunar infarct-2. DVT Prophylaxis/Anticoagulation: Pharmaceutical: Lovenox 3. Chronic back pain/ Pain Management:lumbar stenosis and scoliosis , severe, No Pain c/o  On lyrica bid. Off OxyContin and ultram  Will usetylenol prn to prevent sedation.  4. Mood: LCSW to follow for evaluation and support.  5. Neuropsych: This patient is not capable of making decisions on her own behalf. 6. Skin/Wound Care: Maintain adequate nutrition and hydration status. Routine pressure relief measures.  7. Fluids/Electrolytes/Nutrition:  Monitor I/O. Offer supplements if intake poor. Protein supplement as.   Repeat labs pending, will review  8. COPD: Continue IS. On dulera bid.  9. OSA/OHS: Question use of CPAP at bedtime   10. Dyslipidemia: Continue Lipitor and fenofibrate. 11. DM type 2: Monitor BS ac/hs.    increased lantus to 30 U on 12/28, am CBG improving 12. HTN: Monitor and slowly resume home medications.   On low side this am but generally running good range. 122/63 this am 13. CKD stage 4: Baseline creatinine at 2.7 per records review- slightly higher than baseline currently, recheck creat as part of monitoring on lovenox  Continue to Monitor with serial checks. Encourage po intake.   LOS (Days) 6 A FACE TO FACE EVALUATION WAS PERFORMED  Alysia Penna E 01/24/2015 9:01 AM

## 2015-01-24 NOTE — Progress Notes (Signed)
Physical Therapy Session Note  Patient Details  Name: Michelle Holmes MRN: [id number] Date of Birth: [birth year]  Today's Date: 01/24/2015 PT Individual Time: 1430-1510 PT Individual Time Calculation (min): 40 min   Short Term Goals: Week 1:  PT Short Term Goal 1 (Week 1): Pt will transition supine>sit using hospital bed functions with supervision PT Short Term Goal 2 (Week 1): Pt will perform sit<>stand with LRAD and max assist  PT Short Term Goal 3 (Week 1): Pt will initiate gait training with PT and LRAD PT Short Term Goal 4 (Week 1): Pt will propel w/c 77' with mod assist   Therapy Documentation Precautions:  Precautions Precautions: Fall Precaution Comments: R hemiparesis Restrictions Weight Bearing Restrictions: No   Sister and Daughter present at beginning of session Discussion at length at beginning of session regarding current functional status, goals, plan of care and discharge recommendations on 24/7 assistance upon discharge. Family verbalized understanding of recommendations and understood at this patient would require 24/7 assistance upon discharge. Daughter states that family will help with rotating shifts. Offered family training and observation. Family declined to stay for full session and stated they would attend training later.   Sit to and from stand transfer at left handrail mod assist  Patient ambulated 5 feet with left handrail and max assistx1; +2 for wheelchair follow and safety. Patient ambulated with a step to pattern. Patient required total assist for advancement, placement and stabilization of RLE. Patient Verbal cues required throughout. Educated on proper sequence and technique.  Wheelchair propulsion with Left upper extremity and left lower extremity 35 feet with max assist assist and encouragement, patient educated on proper sequence and technique however presented with increased frustration requiring frequent encouragement and redirection.   Patient  required max encouragement for participation. Patient refused transfer attempts and required max encouragement for participation. Patient with low frustration tolerance for any challenging activity. Patient required rest breaks throughout session. Patient returned to room at end of session with all needs met, call bell and phone within reach and quick release belt engaged.   Therapy/Group: Individual Therapy  Retta Diones 01/24/2015, 3:56 PM

## 2015-01-24 NOTE — Progress Notes (Signed)
Occupational Therapy Session Note  Patient Details  Name: Michelle Holmes MRN: [id number] Date of Birth: 11/10/1950  Today's Date: 01/24/2015 OT Individual Time: 1105-1200 OT Individual Time Calculation (min): 55 min    Short Term Goals: Week 1:  OT Short Term Goal 1 (Week 1): Pt. will demonstrated safety with eating by eating slowly OT Short Term Goal 2 (Week 1): Pt will balance in stting EOB for 20 minutes with SBA OT Short Term Goal 3 (Week 1): Pt will bath self UB with mod assist OT Short Term Goal 4 (Week 1): Pt will bathe LB with max assist. OT Short Term Goal 5 (Week 1): Pt will transfer to 3n1 with mod assist.    Skilled Therapeutic Interventions/Progress Updates:    Treatment session with planned focus on transfer training with pt refusing to complete transfers.  Able to convince pt to engage in sit > stand with use of Stedy.  Engaged in education on working away from pulling up on Stedy to pushing up from w/c for sit >stand to progress towards transfers, however pt stating "hell no".  Able to complete sit > stand from [place] with LUE on bar and therapist supporting RUE with pt progressing from mod to min assist.  Pt requiring multiple rest breaks and encouragement secondary to decreased motivation.  Pt able to maintain standing posture for approx 20-30 seconds each time.  Pt returned to room at end of session and left upright in w/c with all needs in reach.  Therapy Documentation Precautions:  Precautions Precautions: Fall Precaution Comments: R hemiparesis Restrictions Weight Bearing Restrictions: No Pain: Pain Assessment Pain Score: 0-No pain  See Function Navigator for Current Functional Status.   Therapy/Group: Individual Therapy  [name] 01/24/2015, 12:26 PM

## 2015-01-25 ENCOUNTER — Inpatient Hospital Stay (HOSPITAL_COMMUNITY): Payer: Medicare Other | Admitting: Occupational Therapy

## 2015-01-25 ENCOUNTER — Inpatient Hospital Stay (HOSPITAL_COMMUNITY): Payer: Self-pay | Admitting: Physical Therapy

## 2015-01-25 ENCOUNTER — Inpatient Hospital Stay (HOSPITAL_COMMUNITY): Payer: Medicare Other | Admitting: Speech Pathology

## 2015-01-25 LAB — GLUCOSE, CAPILLARY
Glucose-Capillary: 165 mg/dL — ABNORMAL HIGH (ref 65–99)
Glucose-Capillary: 188 mg/dL — ABNORMAL HIGH (ref 65–99)
Glucose-Capillary: 285 mg/dL — ABNORMAL HIGH (ref 65–99)
Glucose-Capillary: 246 mg/dL — ABNORMAL HIGH (ref 65–99)

## 2015-01-25 LAB — BASIC METABOLIC PANEL
Anion gap: 13 (ref 5–15)
BUN: 48 mg/dL — ABNORMAL HIGH (ref 6–20)
CO2: 16 mmol/L — ABNORMAL LOW (ref 22–32)
Calcium: 9.9 mg/dL (ref 8.9–10.3)
Chloride: 113 mmol/L — ABNORMAL HIGH (ref 101–111)
Creatinine, Ser: 3.21 mg/dL — ABNORMAL HIGH (ref 0.44–1.00)
GFR calc Af Amer: 17 mL/min — ABNORMAL LOW (ref >60)
GFR calc non Af Amer: 14 mL/min — ABNORMAL LOW (ref >60)
Glucose, Bld: 239 mg/dL — ABNORMAL HIGH (ref 65–99)
Potassium: 6.3 mmol/L (ref 3.5–5.1)
Sodium: 142 mmol/L (ref 135–145)

## 2015-01-25 LAB — CREATININE, SERUM
Creatinine, Ser: 3.25 mg/dL — ABNORMAL HIGH (ref 0.44–1.00)
GFR calc Af Amer: 16 mL/min — ABNORMAL LOW (ref >60)
GFR calc non Af Amer: 14 mL/min — ABNORMAL LOW (ref >60)

## 2015-01-25 MED ORDER — SODIUM CHLORIDE 0.45 % IV SOLN: INTRAVENOUS | Status: DC

## 2015-01-25 MED ORDER — SODIUM CHLORIDE 0.45 % IV SOLN
INTRAVENOUS | Status: DC
Start: 2015-01-25 — End: 2015-01-25
  Filled 2015-01-25: qty 1000

## 2015-01-25 MED ORDER — ENSURE ENLIVE PO LIQD
237.0000 mL | Freq: Two times a day (BID) | ORAL | Status: DC
Start: 2015-01-25 — End: 2015-01-25

## 2015-01-25 MED ORDER — SODIUM CHLORIDE 0.45 % IV SOLN
INTRAVENOUS | Status: DC
  Administered 2015-01-26 – 2015-01-27 (×2): via INTRAVENOUS
  Filled 2015-01-25 (×4): qty 1000

## 2015-01-25 MED ORDER — NEPRO/CARBSTEADY PO LIQD
237.0000 mL | Freq: Two times a day (BID) | ORAL | Status: DC
  Administered 2015-01-27 – 2015-01-29 (×3): 237 mL via ORAL

## 2015-01-25 MED ORDER — INSULIN GLARGINE 100 UNIT/ML ~~LOC~~ SOLN
35.0000 [IU] | Freq: Every day | SUBCUTANEOUS | Status: DC
  Administered 2015-01-25 – 2015-02-04 (×11): 35 [IU] via SUBCUTANEOUS
  Filled 2015-01-25 (×11): qty 0.35

## 2015-01-25 MED ORDER — SODIUM POLYSTYRENE SULFONATE 15 GM/60ML PO SUSP
30.0000 g | Freq: Once | ORAL | Status: DC | PRN
  Filled 2015-01-25: qty 120

## 2015-01-25 MED ORDER — NEPRO/CARBSTEADY PO LIQD: 237.0000 mL | Freq: Three times a day (TID) | ORAL | Status: DC

## 2015-01-25 MED ORDER — BOOST / RESOURCE BREEZE PO LIQD
1.0000 | Freq: Every day | ORAL | Status: DC
  Administered 2015-01-26 – 2015-01-29 (×2): 1 via ORAL

## 2015-01-25 MED ORDER — SODIUM POLYSTYRENE SULFONATE 15 GM/60ML PO SUSP
30.0000 g | Freq: Once | ORAL | Status: AC
  Administered 2015-01-25: 30 g via ORAL
  Filled 2015-01-25: qty 120

## 2015-01-25 MED ORDER — SODIUM CHLORIDE 0.45 % IV SOLN
INTRAVENOUS | Status: DC
  Administered 2015-01-25 – 2015-01-27 (×3): via INTRAVENOUS
  Administered 2015-01-27: 1 mL via INTRAVENOUS
  Administered 2015-01-28: 17:00:00 via INTRAVENOUS
  Administered 2015-01-28: 1000 mL via INTRAVENOUS

## 2015-01-25 NOTE — Progress Notes (Addendum)
[place] PHYSICAL MEDICINE & REHABILITATION     PROGRESS NOTE    Subjective/Complaints:  Somnolent, opens eyes to command  ROS: Denies CP, SOB, N/V/D  Objective: Vital Signs: Blood pressure 104/59, pulse 69, temperature 97.7 F (36.5 C), temperature source Oral, resp. rate 17, weight 99.8 kg (220 lb 0.3 oz), SpO2 100 %. No results found. No results for input(s): WBC, HGB, HCT, PLT in the last 72 hours.  Recent Labs  01/25/15 0047  CREATININE 3.25*   CBG (last 3)   Recent Labs  01/24/15 1750 01/24/15 2115 01/25/15 0653  GLUCAP 194* 163* 165*    Wt Readings from Last 3 Encounters:  01/25/15 99.8 kg (220 lb 0.3 oz)  01/14/15 103.193 kg (227 lb 8 oz)  01/12/15 102.513 kg (226 lb)    Physical Exam:  BP 104/59 mmHg  Pulse 69  Temp(Src) 97.7 F (36.5 C) (Oral)  Resp 17  Wt 99.8 kg (220 lb 0.3 oz)  SpO2 100% Constitutional: She appears well-developed and well-nourished. NAD HENT: Normocephalic and atraumatic.  Eyes: Conjunctivae and EOM are normal.  Cardiovascular: Normal rate and regular rhythm.  Respiratory: Effort normal and breath sounds normal. No respiratory distress. She has no wheezes.  GI: Soft. Bowel sounds are normal. There is no tenderness.  Musculoskeletal: She exhibits no edema or tenderness.  Neurological: She is somnolent, but opens eyes briefly to verbal command Has poor postural awareness.  Right facial weakness with dysarthric speech , positive aphasia both receptive and expressive Exam limited by poor participation., no withdrawal to pinch in RUE  Motor: LUE: 4/5 proximal to distal LLE: 4+/5 proximal to distal  RLE: 2-/5  RUE: 0/5 except trace grip Right facial weakness  Skin: Skin is warm and dry. No erythema.  Psychiatric: Her affect is blunt. Her speech is delayed. She is slowed. Cognition and memory are impaired. She expresses inappropriate judgment.  Assessment/Plan: 1. Functional deficits secondary to Left subthalamic lacunar  infarct causing RIght hemiparesis  which require 3+ hours per day of interdisciplinary therapy in a comprehensive inpatient rehab setting. Physiatrist is providing close team supervision and 24 hour management of active medical problems listed below. Physiatrist and rehab team continue to assess barriers to discharge/monitor patient progress toward functional and medical goals.  Function:  Bathing Bathing position Bathing activity did not occur: Refused Position: Wheelchair/chair at sink  Bathing parts Body parts bathed by patient: Chest, Abdomen, Right upper leg, Left upper leg Body parts bathed by helper: Right arm, Left arm, Buttocks, Right lower leg, Left lower leg, Back  Bathing assist Assist Level: 2 helpers      Upper Body Dressing/Undressing Upper body dressing Upper body dressing/undressing activity did not occur: Refused What is the patient wearing?: Pull over shirt/dress     Pull over shirt/dress - Perfomed by patient: Pull shirt over trunk Pull over shirt/dress - Perfomed by helper: Thread/unthread right sleeve, Thread/unthread left sleeve, Put head through opening        Upper body assist Assist Level:  (Max assist)      Lower Body Dressing/Undressing Lower body dressing   What is the patient wearing?: Pants, Non-skid slipper socks       Pants- Performed by helper: Thread/unthread right pants leg, Thread/unthread left pants leg, Pull pants up/down   Non-skid slipper socks- Performed by helper: Don/doff right sock, Don/doff left sock                  Lower body assist Assist for lower body dressing: 2  Helpers      Naval architect activity did not occur: N/A Toileting steps completed by patient: Performs perineal hygiene Toileting steps completed by helper: Adjust clothing prior to toileting, Performs perineal hygiene, Adjust clothing after toileting Toileting Assistive Devices: Other (comment) (steady)  Toileting assist Assist level: Two  helpers   Transfers Chair/bed transfer   Chair/bed transfer method: Squat pivot Chair/bed transfer assist level: Moderate assist (Pt 50 - 74%/lift or lower) Chair/bed transfer assistive device: Mechanical lift Mechanical lift: Stedy   Locomotion Ambulation Ambulation activity did not occur: Scientist, research (physical sciences) activity did not occur: Refused Type: Manual Max wheelchair distance: 5 Assist Level: Maximal assistance (Pt 25 - 49%)  Cognition Comprehension Comprehension assist level: Follows complex conversation/direction with no assist  Expression Expression assist level: Expresses basic 50 - 74% of the time/requires cueing 25 - 49% of the time. Needs to repeat parts of sentences.  Social Interaction Social Interaction assist level: Interacts appropriately 75 - 89% of the time - Needs redirection for appropriate language or to initiate interaction.  Problem Solving Problem solving assist level: Solves basic 25 - 49% of the time - needs direction more than half the time to initiate, plan or complete simple activities  Memory Memory assist level: Recognizes or recalls 25 - 49% of the time/requires cueing 50 - 75% of the time    Medical Problem List and Plan: 1. Dysphagia, Aphasia,right  hemiparesis secondary to Left subthalamic lacunar infarct-2. DVT Prophylaxis/Anticoagulation: Pharmaceutical: Lovenox 3. Chronic back pain/ Pain Management:lumbar stenosis and scoliosis , severe, No Pain c/o  On lyrica bid. Off OxyContin and ultram  Will usetylenol prn to prevent sedation.  4. Mood: LCSW to follow for evaluation and support.  5. Neuropsych: This patient is not capable of making decisions on her own behalf. 6. Skin/Wound Care: Maintain adequate nutrition and hydration status. Routine pressure relief measures.  7. Fluids/Electrolytes/Nutrition: Monitor I/O. Offer supplements if intake poor.0-75% meals, 435ml fluid, add IVF if po intake <1059ml during the  day  Repeat labs pending, will review  8. COPD: Continue IS. On dulera bid.  9. OSA/OHS: Question use of CPAP at bedtime   10. Dyslipidemia: Continue Lipitor and fenofibrate. 11. DM type 2: Monitor BS ac/hs.    increased lantus to 30 U on 12/28, will increase to 35U CBG (last 3)   Recent Labs  01/24/15 1750 01/24/15 2115 01/25/15 0653  GLUCAP 194* 163* 165*    12. HTN: Monitor and slowly resume home medications.   On low side this am but generally running good range. 104/59 this am- no antihypertensive meds will recheck BMET to look for azotemia 13. CKD stage 4: Baseline creatinine at 2.7 per records review- slightly higher than baseline currently, recheck creat as part of monitoring on lovenox  Continue to Monitor with serial checks. Encourage po intake.   LOS (Days) 7 A FACE TO FACE EVALUATION WAS PERFORMED  Alysia Penna E 01/25/2015 7:45 AM

## 2015-01-25 NOTE — Progress Notes (Signed)
CRITICAL VALUE ALERT  Critical value received:  Potassium 6.3  Date of notification:  01/25/15  Time of notification:  1400  Critical value read back:Yes.    Nurse who received alert:  Kern Reap, RN  MD notified (1st page):  Algis Liming, PA  Time of first page:  1400  MD notified (2nd page):  Time of second page:  Responding MD:  Algis Liming, PA  Time MD responded:  1400

## 2015-01-25 NOTE — Progress Notes (Signed)
Speech Language Pathology Daily Session Note  Patient Details  Name: Michelle Holmes MRN: [id number] Date of Birth: 1950-07-10  Today's Date: 01/25/2015 SLP Individual Time: 1430-1450 SLP Individual Time Calculation (min): 20 min  Short Term Goals: Week 2: SLP Short Term Goal 1 (Week 2): Pt will consume dys 3 textures and thin liquids with supervision cues to monitor and correct oral residue and rate of self feeding over 3 targeted sessions.  SLP Short Term Goal 2 (Week 2): Pt will sustain her attention to basic familiar tasks for 3-5 minutes with min verbal cues for redirection.  SLP Short Term Goal 3 (Week 2): Pt will be intelligible at the phrase level in 75% of opportunities with max assist verbal cues.   SLP Short Term Goal 4 (Week 2): Pt will consume trials of regular textures with min verbal cues for use of swallowing precautions over 2 targeted sessions prior to advancement.   Skilled Therapeutic Interventions:  Pt was seen for skilled ST targeting communication goals.  Pt was intelligible at the phrase level in ~50% of opportunities during loosely structured conversations with the SLP in a moderately distracting environment.  Pt demonstrates poor awareness of verbal errors and had difficulty correcting them despite max assist verbal cues from the SLP.  Pt was left in room with call bell within reach and quick release belt donned for safety.  Continue per current plan of care.    Function:  Eating Eating                 Cognition Comprehension Comprehension assist level: Follows basic conversation/direction with no assist  Expression   Expression assist level: Expresses basic 90% of the time/requires cueing < 10% of the time.  Social Interaction Social Interaction assist level: Interacts appropriately 75 - 89% of the time - Needs redirection for appropriate language or to initiate interaction.  Problem Solving Problem solving assist level: Solves basic 25 - 49% of the time -  needs direction more than half the time to initiate, plan or complete simple activities  Memory Memory assist level: Recognizes or recalls 25 - 49% of the time/requires cueing 50 - 75% of the time    Pain Pain Assessment Pain Assessment: No/denies pain  Therapy/Group: Individual Therapy  [name], Selinda Orion 01/25/2015, 4:27 PM

## 2015-01-25 NOTE — Progress Notes (Addendum)
Speech Language Pathology Weekly Progress and Session Note  Patient Details  Name: Michelle Holmes MRN: [id number] Date of Birth: [birth year]  Beginning of progress report period: January 18, 2015 End of progress report period: January 25, 2015   Today's Date: 01/25/2015 SLP Individual Time: 0800-0900 SLP Individual Time Calculation (min): 60 min  Short Term Goals: Week 1: SLP Short Term Goal 1 (Week 1): Pt will consume dys 3 textures and thin liquids with min assist verbal cues to monitor and correct oral residue and rate of self feeding over 3 targeted sessions.  SLP Short Term Goal 1 - Progress (Week 1): Met SLP Short Term Goal 2 (Week 1): Pt will sustain her attention to basic familiar tasks for 3-5 minutes with mod verbal cues for redirection.  SLP Short Term Goal 2 - Progress (Week 1): Met SLP Short Term Goal 3 (Week 1): Pt will be intelligible at the phrase level in 75% of opportunities with max assist verbal cues.   SLP Short Term Goal 3 - Progress (Week 1): Other (comment) (not addressed this reporting period due to cognitive deficits )    New Short Term Goals: Week 2: SLP Short Term Goal 1 (Week 2): Pt will consume dys 3 textures and thin liquids with supervision cues to monitor and correct oral residue and rate of self feeding over 3 targeted sessions.  SLP Short Term Goal 2 (Week 2): Pt will sustain her attention to basic familiar tasks for 3-5 minutes with min verbal cues for redirection.  SLP Short Term Goal 3 (Week 2): Pt will be intelligible at the phrase level in 75% of opportunities with max assist verbal cues.   SLP Short Term Goal 4 (Week 2): Pt will consume trials of regular textures with min verbal cues for use of swallowing precautions over 2 targeted sessions prior to advancement.   Weekly Progress Updates:  Pt made slow functional gains this reporting period and has met 2 out of 2 targeted short term goals.  Short term goal for intelligibility not addressed this  reporting period due to cognitive deficits and will be carried over into the next. Pt's use of swallowing precautions during meals fluctuates from min assist to mod I depending on pt's distraction to the environment.  She continues to demonstrate impaired mastication with regular textures and so remains on dys 3 solids.  Pt requires anywhere from min to max assist for basic functional tasks due to decreased sustained attention and decreased motivation to participate in structured tasks which impact all higher level cognitive processes.  Pt's intelligibility at the phrase level also fluctuates from min to max assist depending on motivation, alertness, and distraction to the environment.  Pt would continue to benefit from skilled ST while inpatient in order to maximize functional independence and reduce burden of care prior to discharge.  No family has been present for training at this time.    Intensity: Minumum of 1-2 x/day, 30 to 90 minutes Frequency: 3 to 5 out of 7 days Duration/Length of Stay: 21-28 days  Treatment/Interventions: Cognitive remediation/compensation;Cueing hierarchy;Dysphagia/aspiration precaution training;Environmental controls;Functional tasks;Patient/family education;Internal/external aids   Daily Session  Skilled Therapeutic Interventions: Pt was seen for skilled ST targeting cognition and dysphagia goals.  SLP facilitated the session with trials of regular textures to continue working towards diet progression.  Pt demonstrated impaired mastication of solids with right sided buccal residue which she was able to clear with use of a liquid wash and extra time.  Pt demonstrated x2 immediate cough  following mixed regular and thin liquid consistencies due to fast rate and decreased attention to boluses.  Min assist verbal cues were needed for rate and portion control to minimize overt s/s of aspiration.  Recommend that pt remain on dys 3 textures for now but SLP will continue to follow up  for trials of advanced textures to assess readiness for progression.  SLP also utilized the [employer] to address pt's sustained attention to tasks.  Pt was able to locate 17 targets in 1 minute with mod assist verbal cues, improving to 18 targets in 1 minute with max assist verbal and tactile cues for initiation.  Max cues were needed for encouragement throughout task.   Pt was returned to room and left in wheelchair with call bell within reach.  Golas updated on this date to reflect current progress and plan of care.     Function:   Eating Eating   Modified Consistency Diet: Yes Eating Assist Level: Set up assist for;Supervision or verbal cues;Helper checks for pocketed food   Eating Set Up Assist For: Opening containers       Cognition Comprehension Comprehension assist level: Follows basic conversation/direction with no assist  Expression   Expression assist level: Expresses basic 90% of the time/requires cueing < 10% of the time.  Social Interaction Social Interaction assist level: Interacts appropriately 75 - 89% of the time - Needs redirection for appropriate language or to initiate interaction.  Problem Solving Problem solving assist level: Solves basic 25 - 49% of the time - needs direction more than half the time to initiate, plan or complete simple activities  Memory Memory assist level: Recognizes or recalls 25 - 49% of the time/requires cueing 50 - 75% of the time   General    Pain Pain Assessment Pain Assessment: No/denies pain Pain Score: 0-No pain  Therapy/Group: Individual Therapy  Fatema Rabe, Selinda Orion 01/25/2015, 12:21 PM

## 2015-01-25 NOTE — Progress Notes (Signed)
This RN cared for pt between 1100 and 1500. Agree with prior RN assessment

## 2015-01-25 NOTE — Progress Notes (Addendum)
Initial Nutrition Assessment  DOCUMENTATION CODES:   Obesity unspecified  INTERVENTION:  Continue Nepro Shake po BID, each supplement provides 425 kcal and 19 grams protein.  Provide Boost Breeze po once daily, each supplement provides 250 kcal and 9 grams of protein.  Provide nourishment snacks (Ordered).  Encourage adequate PO intake.   NUTRITION DIAGNOSIS:   Increased nutrient needs related to chronic illness as evidenced by estimated needs.  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 MRI brain done revealing acute infarct left thalamus and left cerebral peduncle adjacent to limb of left internal capsul and tiny acute ischemic infarct right basal ganglia.   Pt reports appetite is fine, however does not like her food at meals. Meal completion has been varied from 10-100% with intake of 15% at lunch today. Pt reports eating well PTA with consumption of at least 3 meals with snacks in between. Pt with no significant weight loss. Pt currently has Nepro Shake ordered. She reports she does not prefer "milky drinks" much thus is agreeable to [employer]. RD to order. Pt additionally is agreeable to nourishment snacks between meals. RD to order.   Pt with no observed significant fat or muscle mass loss.   Labs: Low CO2, GFR. High chloride, BUN, creatinine, and potassium (6.3).  Diet Order:  DIET DYS 3 Room service appropriate?: Yes; Fluid consistency:: Thin  Skin:  Reviewed, no issues  Last BM:  12/27  Height:   Ht Readings from Last 1  Encounters:  01/14/15 5\' 5"  (1.651 m)    Weight:   Wt Readings from Last 1 Encounters:  01/25/15 220 lb 0.3 oz (99.8 kg)    Ideal Body Weight:  56.8 kg  BMI:  Body mass index is 36.61 kg/(m^2).  Estimated Nutritional Needs:   Kcal:  1850-2050  Protein:  90-100 grams  Fluid:  1.8 - 2 L/day  EDUCATION NEEDS:   No education needs identified at this time  Corrin Parker, MS, RD, LDN Pager # [phone] After hours/ weekend pager # [phone]

## 2015-01-25 NOTE — Progress Notes (Signed)
Occupational Therapy Weekly Progress Note  Patient Details  Name: Michelle Holmes MRN: [id number] Date of Birth: 1950/09/20  Beginning of progress report period: January 19, 2015 End of progress report period: January 25, 2015  Today's Date: 01/25/2015 OT Individual Time: 1050-1200 OT Individual Time Calculation (min): 70 min    Patient has met 1 of 5 short term goals.  Pt is making slow progress towards goals, due to decreased motivation and participation in treatment sessions.  Pt limits her participation by refusing to attempt various tasks and being reluctant to attempting new tasks.  Pt can transfer bed > w/c or toilet with use of Stedy (lift) with min-mod assist as pt is able to pull up into standing with Stedy bar and then be positioned over top of w/c or toilet via Stedy being pushed by therapist/nursing staff.  Pt refusing to attempt transfers without lift equipment.  Decreased participation in bathing and dressing tasks with pt reporting that her family assisted her after her previous stroke and wants to return home for them to assist.  Pt has demonstrated improved initiation with movement, ability to complete bridging at bed level to assist with LB dressing, and intermittent shoulder movements in supported position.   Patient continues to demonstrate the following deficits: cognitive deficits impacting her motivation and participation, RUE/RLE hemiparesis, decreased activity tolerance, impaired balance, decreased awareness of deficits and therefore will continue to benefit from skilled OT intervention to enhance overall performance with BADL and Reduce care partner burden.  Patient progressing toward long term goals..  Continue plan of care.  OT Short Term Goals Week 1:  OT Short Term Goal 1 (Week 1): Pt. will demonstrated safety with eating by eating slowly OT Short Term Goal 1 - Progress (Week 1): Progressing toward goal OT Short Term Goal 2 (Week 1): Pt will balance in stting EOB  for 20 minutes with SBA OT Short Term Goal 2 - Progress (Week 1): Met OT Short Term Goal 3 (Week 1): Pt will bath self UB with mod assist OT Short Term Goal 3 - Progress (Week 1): Not met OT Short Term Goal 4 (Week 1): Pt will bathe LB with max assist. OT Short Term Goal 4 - Progress (Week 1): Not met OT Short Term Goal 5 (Week 1): Pt will transfer to 3n1 with mod assist.   OT Short Term Goal 5 - Progress (Week 1): Progressing toward goal Week 2:  OT Short Term Goal 1 (Week 2): Pt will complete UB bathing with mod assist OT Short Term Goal 2 (Week 2): Pt will complete UB dressing with mod assist OT Short Term Goal 3 (Week 2): Pt will complete squat pivot transfer to drop arm BSC with mod assist OT Short Term Goal 4 (Week 2): Pt will complete sit > stand at sink in preparation for grooming tasks with mod assist OT Short Term Goal 5 (Week 2): Pt will don pants at bed level with mod assist  Skilled Therapeutic Interventions/Progress Updates:    Treatment session with focus on increased participation, trunk control, attention to RUE, and self-care retraining.  Pt received seated in w/c, declining bathing but willing to change clothes.  Donned shirt with total assist, pt reporting throughout that she could don shirt without assist, however requiring physical assist throughout each step.  IV RN arrived and requested pt be transferred back to bed to place IV.  Pt declined transfer without use of Stedy and attempted to decline IV placement. Educated on importance of fluid  intake to recovery.  Completed transfer to bed with Stedy with pt pulling up to standing with mod assist.  Returned to bed with max assist to position BLE in bed.  Completed LB dressing at bed level with pt demonstrating initiation at Rt hip to bend RLE to assist with LB dressing.  Completed bridging in bed to pull pants over hips and don clean brief with therapist providing physical assist at RLE to maintain positioning to elicit bridging.   Engaged in bed mobility with rolling to Rt and educating on bed positioning to increase proper positioning for RUE.  Returned to w/c post dressing with use of Stedy min assist.  Engaged in [place] in sitting position as pt refusing to leave room.  Focus on shoulder elevation, flexion/extension.  Pt continues to require max encouragement to participate.  Therapy Documentation Precautions:  Precautions Precautions: Fall Precaution Comments: R hemiparesis Restrictions Weight Bearing Restrictions: No Pain: Pain Assessment Pain Assessment: No/denies pain Pain Score: 0-No pain  See Function Navigator for Current Functional Status.   Therapy/Group: Individual Therapy  [name] 01/25/2015, 12:28 PM

## 2015-01-25 NOTE — Progress Notes (Signed)
Physical Therapy Weekly Progress Note  Patient Details  Name: Michelle Holmes MRN: [id number] Date of Birth: Mar 29, 1950  Beginning of progress report period: January 19, 2015 End of progress report period: January 25, 2015  Today's Date: 01/25/2015 PT Individual Time: 1300-1355 PT Individual Time Calculation (min): 55 min   Patient has met 2 of 4 short term goals.  Pt performing sit <>stand with Stedy and LUE pulling up with min to modA. Limited transfer, gait and w/c training due to pt refusal and poor motivation, poor carryover of cues for sequencing from previous sessions. Gait with hall rail performed x20' maximum, again limited due to poor motivation, and pt generally disagreeable to mobility tasks. Discussed with close friend who visited during session the possibility of having more family present during sessions to assist in motivating pt.  Patient continues to demonstrate the following deficits: impaired  activity tolerance, balance, postural control, ability to compensate for deficits, functional use of  right upper extremity and right lower extremity, attention, awareness and coordination and therefore will continue to benefit from skilled PT intervention to enhance overall performance with bed mobility, transfers, gait, home and community access.  Patient progressing slowly towards long term goals. .  Continue plan of care.  PT Short Term Goals Week 1:  PT Short Term Goal 1 (Week 1): Pt will transition supine>sit using hospital bed functions with supervision PT Short Term Goal 1 - Progress (Week 1): Not met PT Short Term Goal 2 (Week 1): Pt will perform sit<>stand with LRAD and max assist  PT Short Term Goal 2 - Progress (Week 1): Met PT Short Term Goal 3 (Week 1): Pt will initiate gait training with PT and LRAD PT Short Term Goal 3 - Progress (Week 1): Met PT Short Term Goal 4 (Week 1): Pt will propel w/c 65' with mod assist PT Short Term Goal 4 - Progress (Week 1): Not met Week  2:  PT Short Term Goal 1 (Week 2): Pt will perform supine <>sit with S using hospital bed functions PT Short Term Goal 2 (Week 2): Pt will perform squat pivot transfer consistent modA to R/L side PT Short Term Goal 3 (Week 2): Pt will perform gait with LRAD x30' modA PT Short Term Goal 4 (Week 2): Pt will propel w/c with BUE 50' modA  Skilled Therapeutic Interventions/Progress Updates:    Pt received seated in w/c, no c/o pain and initially declining therapy due to visitors coming, however able to encourage pt to participate once agreed to leave a sign in her room to tell visitors where pt will be. Pt requests to use the restroom. Stedy used to transfer pt to [facility] with modA decreased to min guard by end of transfer, LUE support on Stedy for balance. Therapist performed donning/doffing pants and hygiene totalA due to poor balance, UE ROM. Encouraged pt to propel w/c in hallway, performed x5 pushes before declining and noted visitor arriving. With assist of pt's visitor (friend), encouraged pt to ambulate with L rail x20' maxA for RLE progression and stance control. Extended seated rest break follow gait trial to increase pt cooperation. Stand pivot w/c >bed maxA to L side. Sit >supine maxA for management of BLE and cueing for technique and sequencing. Supine>sit minA for pushing trunk to neutral. Stand pivot to R side several times with LOB to R side and requiring therapist assist to return to sitting on EOB. Pt refusal to move w/c to L side to increase ease and safety of transfer.  On third attempt, returned pt to w/c with maxA stand pivot. Max multi-modal cues for repositioning in chair to replace arm rest. Remained seated in w/c with quick release belt intact, all needs in reach at completion of session and friend present.  Therapy Documentation Precautions:  Precautions Precautions: Fall Precaution Comments: R hemiparesis Restrictions Weight Bearing Restrictions: No Pain: Pain Assessment Pain  Assessment: No/denies pain Pain Score: 0-No pain   See Function Navigator for Current Functional Status.  Therapy/Group: Individual Therapy  Luberta Mutter 01/25/2015, 1:57 PM

## 2015-01-26 ENCOUNTER — Inpatient Hospital Stay (HOSPITAL_COMMUNITY): Payer: Self-pay | Admitting: Physical Therapy

## 2015-01-26 ENCOUNTER — Inpatient Hospital Stay (HOSPITAL_COMMUNITY): Payer: Medicare Other | Admitting: Speech Pathology

## 2015-01-26 ENCOUNTER — Inpatient Hospital Stay (HOSPITAL_COMMUNITY): Payer: Self-pay | Admitting: Occupational Therapy

## 2015-01-26 DIAGNOSIS — D696 Thrombocytopenia, unspecified: Secondary | ICD-10-CM | POA: Insufficient documentation

## 2015-01-26 DIAGNOSIS — D649 Anemia, unspecified: Secondary | ICD-10-CM

## 2015-01-26 LAB — BASIC METABOLIC PANEL
Anion gap: 9 (ref 5–15)
BUN: 44 mg/dL — ABNORMAL HIGH (ref 6–20)
CO2: 22 mmol/L (ref 22–32)
Calcium: 9.5 mg/dL (ref 8.9–10.3)
Chloride: 112 mmol/L — ABNORMAL HIGH (ref 101–111)
Creatinine, Ser: 3.01 mg/dL — ABNORMAL HIGH (ref 0.44–1.00)
GFR calc Af Amer: 18 mL/min — ABNORMAL LOW (ref >60)
GFR calc non Af Amer: 15 mL/min — ABNORMAL LOW (ref >60)
Glucose, Bld: 152 mg/dL — ABNORMAL HIGH (ref 65–99)
Potassium: 4.6 mmol/L (ref 3.5–5.1)
Sodium: 143 mmol/L (ref 135–145)

## 2015-01-26 LAB — GLUCOSE, CAPILLARY
Glucose-Capillary: 138 mg/dL — ABNORMAL HIGH (ref 65–99)
Glucose-Capillary: 153 mg/dL — ABNORMAL HIGH (ref 65–99)
Glucose-Capillary: 167 mg/dL — ABNORMAL HIGH (ref 65–99)
Glucose-Capillary: 251 mg/dL — ABNORMAL HIGH (ref 65–99)

## 2015-01-26 LAB — CBC
HCT: 28.4 % — ABNORMAL LOW (ref 36.0–46.0)
Hemoglobin: 9.3 g/dL — ABNORMAL LOW (ref 12.0–15.0)
MCH: 31.1 pg (ref 26.0–34.0)
MCHC: 32.7 g/dL (ref 30.0–36.0)
MCV: 95 fL (ref 78.0–100.0)
Platelets: 127 10*3/uL — ABNORMAL LOW (ref 150–400)
RBC: 2.99 MIL/uL — ABNORMAL LOW (ref 3.87–5.11)
RDW: 14.5 % (ref 11.5–15.5)
WBC: 5.3 10*3/uL (ref 4.0–10.5)

## 2015-01-26 NOTE — Progress Notes (Signed)
[facility] PHYSICAL MEDICINE & REHABILITATION     PROGRESS NOTE    Subjective/Complaints:  Pt seen this AM sleeping.  She is somnolent and only briefly opens her eyes.    ROS: Denies CP, SOB, N/V/D ?reliability  Objective: Vital Signs: Blood pressure 108/86, pulse 84, temperature 98.4 F (36.9 C), temperature source Oral, resp. rate 18, weight 99.3 kg (218 lb 14.7 oz), SpO2 100 %. No results found.  Recent Labs  01/26/15 0641  WBC 5.3  HGB 9.3*  HCT 28.4*  PLT 127*    Recent Labs  01/25/15 1300 01/26/15 0641  NA 142 143  K 6.3* 4.6  CL 113* 112*  GLUCOSE 239* 152*  BUN 48* 44*  CREATININE 3.21* 3.01*  CALCIUM 9.9 9.5   CBG (last 3)   Recent Labs  01/26/15 0652 01/26/15 1133 01/26/15 1638  GLUCAP 138* 153* 167*    Wt Readings from Last 3 Encounters:  01/26/15 99.3 kg (218 lb 14.7 oz)  01/14/15 103.193 kg (227 lb 8 oz)  01/12/15 102.513 kg (226 lb)    Physical Exam:  BP 108/86 mmHg  Pulse 84  Temp(Src) 98.4 F (36.9 C) (Oral)  Resp 18  Wt 99.3 kg (218 lb 14.7 oz)  SpO2 100% Constitutional: NAD. Vital signs reviewed.  HENT: Normocephalic and atraumatic.  Eyes: Conjunctivae and EOM are normal.  Cardiovascular: Normal rate and regular rhythm.  Respiratory: Effort normal and breath sounds normal. No respiratory distress. She has no wheezes.  GI: Soft. Bowel sounds are normal. There is no tenderness.  Musculoskeletal: She exhibits no edema or tenderness.  Neurological: She is somnolent, but opens eyes briefly to verbal command Has poor postural awareness.  Right facial weakness with dysarthric speech Exam limited by poor participation.  Motor: Pt not participating in MMT or sensation questions Right facial weakness Skin: Skin is warm and dry. No erythema.  Psychiatric: Her affect is blunt. Her speech is delayed. She is slowed. Cognition and memory are impaired. She expresses inappropriate judgment.  Assessment/Plan: 1. Functional deficits  secondary to Left subthalamic lacunar infarct causing RIght hemiparesis  which require 3+ hours per day of interdisciplinary therapy in a comprehensive inpatient rehab setting. Physiatrist is providing close team supervision and 24 hour management of active medical problems listed below. Physiatrist and rehab team continue to assess barriers to discharge/monitor patient progress toward functional and medical goals.  Function:  Bathing Bathing position Bathing activity did not occur: Refused Position: Wheelchair/chair at sink  Bathing parts Body parts bathed by patient: Chest, Abdomen, Right upper leg, Left upper leg Body parts bathed by helper: Right arm, Left arm, Buttocks, Right lower leg, Left lower leg, Back  Bathing assist Assist Level: 2 helpers      Upper Body Dressing/Undressing Upper body dressing Upper body dressing/undressing activity did not occur: Refused What is the patient wearing?: Pull over shirt/dress     Pull over shirt/dress - Perfomed by patient: Pull shirt over trunk Pull over shirt/dress - Perfomed by helper: Thread/unthread right sleeve, Thread/unthread left sleeve, Put head through opening, Pull shirt over trunk        Upper body assist Assist Level:  (Total assist)      Lower Body Dressing/Undressing Lower body dressing   What is the patient wearing?: Pants       Pants- Performed by helper: Thread/unthread right pants leg, Thread/unthread left pants leg, Pull pants up/down   Non-skid slipper socks- Performed by helper: Don/doff right sock, Don/doff left sock  Lower body assist Assist for lower body dressing:  (Total assist)      Toileting Toileting Toileting activity did not occur: N/A Toileting steps completed by patient: Performs perineal hygiene Toileting steps completed by helper: Adjust clothing prior to toileting, Performs perineal hygiene, Adjust clothing after toileting Toileting Assistive Devices: Other (comment)  (stedy)  Toileting assist Assist level: Two helpers   Transfers Chair/bed transfer   Chair/bed transfer method: Stand pivot Chair/bed transfer assist level: Maximal assist (Pt 25 - 49%/lift and lower) Chair/bed transfer assistive device: Mechanical lift Mechanical lift: Stedy   Locomotion Ambulation Ambulation activity did not occur: Refused   Max distance: 20 Assist level: Maximal assist (Pt 25 - 49%)   Wheelchair Wheelchair activity did not occur: Refused Type: Manual Max wheelchair distance: 5 Assist Level: Maximal assistance (Pt 25 - 49%)  Cognition Comprehension Comprehension assist level: Follows basic conversation/direction with no assist  Expression Expression assist level: Expresses basic 90% of the time/requires cueing < 10% of the time.  Social Interaction Social Interaction assist level: Interacts appropriately 75 - 89% of the time - Needs redirection for appropriate language or to initiate interaction.  Problem Solving Problem solving assist level: Solves basic 25 - 49% of the time - needs direction more than half the time to initiate, plan or complete simple activities  Memory Memory assist level: Recognizes or recalls 25 - 49% of the time/requires cueing 50 - 75% of the time    Medical Problem List and Plan: 1. Dysphagia, Aphasia,right  hemiparesis secondary to Left subthalamic lacunar infarct  Cont CIR  Will consider ABG tomorrow 2. DVT Prophylaxis/Anticoagulation: Pharmaceutical: Lovenox 3. Chronic back pain/ Pain Management:lumbar stenosis and scoliosis , severe, No Pain c/o  On lyrica bid. Off OxyContin and ultram  Will usetylenol prn to prevent sedation.  4. Mood: LCSW to follow for evaluation and support.  5. Neuropsych: This patient is not capable of making decisions on her own behalf. 6. Skin/Wound Care: Maintain adequate nutrition and hydration status. Routine pressure relief measures.  7. Fluids/Electrolytes/Nutrition: Monitor I/O. Offer  supplements if intake poor. 10-40% meals  8. COPD: Continue IS. On dulera bid.  9. OSA/OHS: Question use of CPAP at bedtime 10. Dyslipidemia: Continue Lipitor and fenofibrate. 11.DM type 2: Monitor BS ac/hs.    increased lantus to 30 U on 12/28, increased to 35U CBG (last 3)   Recent Labs  01/26/15 0652 01/26/15 1133 01/26/15 1638  GLUCAP 138* 153* 167*   Fasting CBP within acceptable range this AM 12. HTN: Monitor and slowly resume home medications.   Within acceptable range this AM 13. CKD stage 4: Baseline creatinine at 2.7 per records review- slightly higher than baseline currently, recheck creat as part of monitoring on lovenox  Continue to Monitor with serial checks. Encourage po intake.   Cr. Improved to 3.01 on 12/31. 14. Anemia  Hb 9.3 on 12/31 15. Thrombocytopenia  Plts 127 on 12/31  LOS (Days) 8 A FACE TO FACE EVALUATION WAS PERFORMED  Charlissa Petros [name] 01/26/2015 7:13 PM

## 2015-01-26 NOTE — Progress Notes (Signed)
Speech Language Pathology Daily Session Note  Patient Details  Name: Michelle Holmes MRN: [id number] Date of Birth: 1950-08-19  Today's Date: 01/26/2015 SLP Individual Time: 1430-1520 SLP Individual Time Calculation (min): 50 min  Short Term Goals: Week 2: SLP Short Term Goal 1 (Week 2): Pt will consume dys 3 textures and thin liquids with supervision cues to monitor and correct oral residue and rate of self feeding over 3 targeted sessions.  SLP Short Term Goal 2 (Week 2): Pt will sustain her attention to basic familiar tasks for 3-5 minutes with min verbal cues for redirection.  SLP Short Term Goal 3 (Week 2): Pt will be intelligible at the phrase level in 75% of opportunities with max assist verbal cues.   SLP Short Term Goal 4 (Week 2): Pt will consume trials of regular textures with min verbal cues for use of swallowing precautions over 2 targeted sessions prior to advancement.   Skilled Therapeutic Interventions: Skilled treatment session focused on dysphagia goals and family education.  SLP facilitated session by providing Max A verbal and tactile cues for utilization of small bites and a slow rate of self-feeding with a snack (doughnut) that was brought in by a friend. Patient also consumed thin liquids via straw with overt cough X 1, suspect due to large sip.  Patient's family arrived later in the session and brought a fresh fruit tray. SLP provided education to the family in regards to current swallowing function, diet recommendations and appropriate textures. Family verbalized understanding, apologized and removed items from patient's reach.  Patient required Mod verbal cues for use of an increased vocal intensity and over articulation at the phrase level and was ~75% intelligible. Patient also demonstrated decreased recall of new information in regards to not carrying over the information that a family had passes away. Patient provided emotional support. Patient left upright in wheelchair  with family present. Continue with current plan of care.    Function:  Eating Eating   Modified Consistency Diet: Yes Eating Assist Level: Set up assist for;Supervision or verbal cues;Helper checks for pocketed food   Eating Set Up Assist For: Opening containers       Cognition Comprehension Comprehension assist level: Follows basic conversation/direction with no assist  Expression   Expression assist level: Expresses basic 90% of the time/requires cueing < 10% of the time.  Social Interaction Social Interaction assist level: Interacts appropriately 75 - 89% of the time - Needs redirection for appropriate language or to initiate interaction.  Problem Solving Problem solving assist level: Solves basic 25 - 49% of the time - needs direction more than half the time to initiate, plan or complete simple activities  Memory Memory assist level: Recognizes or recalls 25 - 49% of the time/requires cueing 50 - 75% of the time    Pain Pain Assessment Pain Assessment: No/denies pain  Therapy/Group: Individual Therapy  Aquil Duhe 01/26/2015, 3:40 PM

## 2015-01-26 NOTE — Progress Notes (Signed)
Occupational Therapy Session Note  Patient Details  Name: Michelle Holmes MRN: [id number] Date of Birth: [birth year]  Today's Date: 01/26/2015 OT Individual Time: 1300-1345 OT Individual Time Calculation (min): 45 min    Short Term Goals: Week 2:  OT Short Term Goal 1 (Week 2): Pt will complete UB bathing with mod assist OT Short Term Goal 2 (Week 2): Pt will complete UB dressing with mod assist OT Short Term Goal 3 (Week 2): Pt will complete squat pivot transfer to drop arm BSC with mod assist OT Short Term Goal 4 (Week 2): Pt will complete sit > stand at sink in preparation for grooming tasks with mod assist OT Short Term Goal 5 (Week 2): Pt will don pants at bed level with mod assist  Skilled Therapeutic Interventions/Progress Updates:    Pt seen for OT tx session focusing on functional mobility, participation, and use of R UE. Therapist approached by RN prior to tx session to assist RN with pt taking suppository as pt refusing with RN and pt having not had BM in 5 days. Upon arrival, pt sitting up in w/c voicing need to complete toileting task. STEDY used to transfer pt to toilet. Pt unsuccessful in having BM without medication. pt initially refusing medications. Educated pt regarding purpose of medication, health risk of not having BM over extended periods, and goals of therapy to assist pt in regaining abilities. Reluctantly, pt agreeable to suppository. Pt stood in STEDY with min A and steadying assist provided while RN placed suppository. Transfer off toilet completed using STEDY. Pt set-up at sink to complete hand washing task, becoming agitated with therapist when attempting to initiate use of R UE in functional task. Educated regarding importance of incorporating R UE into tasks for return of function.  Pt returned to w/c and desiring ice cream cup. Attempted to have pt use R UE as stabilizer while eating ice cream with L UE, however, pt once again becoming agitated and refusing to eat ice  cream at all. With encouragement, pt willinging to attempt again and used R UE with max A at stabilizer level to hold cup to eat ice cream. She required max VCs for small bites and swallowing techniques. Pt left sitting in w/c at end of session, all needs in reach.  Pt r  Therapy Documentation Precautions:  Precautions Precautions: Fall Precaution Comments: R hemiparesis Restrictions Weight Bearing Restrictions: No Pain:   No/ denies pain  See Function Navigator for Current Functional Status.   Therapy/Group: Individual Therapy  Lewis, Ashaz Robling C 01/26/2015, 6:57 AM

## 2015-01-26 NOTE — Progress Notes (Signed)
Physical Therapy Session Note  Patient Details  Name: Michelle Holmes MRN: [id number] Date of Birth: 07-25-1950  Today's Date: 01/26/2015 PT Individual Time: [id number] PT Individual Time Calculation (min): 60 min   Short Term Goals: Week 2:  PT Short Term Goal 1 (Week 2): Pt will perform supine <>sit with S using hospital bed functions PT Short Term Goal 2 (Week 2): Pt will perform squat pivot transfer consistent modA to R/L side PT Short Term Goal 3 (Week 2): Pt will perform gait with LRAD x30' modA PT Short Term Goal 4 (Week 2): Pt will propel w/c with BUE 50' modA  Skilled Therapeutic Interventions/Progress Updates:   Pt received in bed with call bell on L side of bed on floor; pt requesting to change into clean pajamas before starting PT.  Discussed with pt that she would have a bath in the pm; pt continued to request to change clothes before this pm.  Pt performed supine > sit on R side of bed with max A to advance RLE off EOB and to bring trunk upright EOB.  Pt engaged in dynamic sitting balance, trunk/postural control training and sequencing of task during clothing change in unsupported sitting.  Pt performed 50% of doffing shirt and donning clean shirt with pt verbalizing sequence to therapist 25%.  Pt reporting need to urinate.  Pt performed transfer sit > stand from bed with use of Stedy with max A and transferred to [facility].  Performed sit <> stand from [facility] and stood for hygiene and clothing management with min-mod A on R side for trunk control.  Returned to sitting EOB to change pants and then transferred to w/c with Stedy max A.   Pt given options for rest of time of gait training or transfer training to/from car.  Pt selected car transfer training.  Demonstrated to pt sequence for squat pivot to/from car.  Pt performed squat pivot w/c > simulated car with +2 A to maintain anterior lean and cues for full pivot to car; also required cues for trunk position, weight shifting to place LE into car  with min-mod A.  Returned to w/c squat pivot with +2 A and repositioned in w/c with +2 due to pt refusing to lean forwards again.  Returned to room and pt left with all items within reach.      Therapy Documentation Precautions:  Precautions Precautions: Fall Precaution Comments: R hemiparesis Restrictions Weight Bearing Restrictions: No Pain: Pain Assessment Pain Assessment: No/denies pain  See Function Navigator for Current Functional Status.   Therapy/Group: Individual Therapy  Raylene Everts [facility] 01/26/2015, 12:38 PM

## 2015-01-27 ENCOUNTER — Inpatient Hospital Stay (HOSPITAL_COMMUNITY): Payer: Self-pay | Admitting: Physical Therapy

## 2015-01-27 LAB — GLUCOSE, CAPILLARY
Glucose-Capillary: 146 mg/dL — ABNORMAL HIGH (ref 65–99)
Glucose-Capillary: 173 mg/dL — ABNORMAL HIGH (ref 65–99)
Glucose-Capillary: 208 mg/dL — ABNORMAL HIGH (ref 65–99)
Glucose-Capillary: 215 mg/dL — ABNORMAL HIGH (ref 65–99)

## 2015-01-27 NOTE — Progress Notes (Signed)
Physical Therapy Session Note  Patient Details  Name: Michelle Holmes MRN: [id number] Date of Birth: 1950-07-07  Today's Date: 01/27/2015 PT Individual Time: 1100-1200 PT Individual Time Calculation (min): 60 min   Short Term Goals: Week 1:  PT Short Term Goal 1 (Week 1): Pt will transition supine>sit using hospital bed functions with supervision PT Short Term Goal 1 - Progress (Week 1): Not met PT Short Term Goal 2 (Week 1): Pt will perform sit<>stand with LRAD and max assist  PT Short Term Goal 2 - Progress (Week 1): Met PT Short Term Goal 3 (Week 1): Pt will initiate gait training with PT and LRAD PT Short Term Goal 3 - Progress (Week 1): Met PT Short Term Goal 4 (Week 1): Pt will propel w/c 74' with mod assist PT Short Term Goal 4 - Progress (Week 1): Not met Week 2:  PT Short Term Goal 1 (Week 2): Pt will perform supine <>sit with S using hospital bed functions PT Short Term Goal 2 (Week 2): Pt will perform squat pivot transfer consistent modA to R/L side PT Short Term Goal 3 (Week 2): Pt will perform gait with LRAD x30' modA PT Short Term Goal 4 (Week 2): Pt will propel w/c with BUE 50' modA  Skilled Therapeutic Interventions/Progress Updates:    Pt defers out of room activities, but amenable to activities in room. Encouragement, frequent rest breaks, and graded activity all assist in getting pt to participate in therapy. Difficult for patient to take all information in at once. Pt would continue to benefit from skilled PT services to increase functional mobility.  Therapy Documentation Precautions:  Precautions Precautions: Fall Precaution Comments: R hemiparesis Restrictions Weight Bearing Restrictions: No Pain: Pain Assessment Pain Assessment: No/denies pain Mobility:  Mod A with cues for hand placement and weight shift Other Treatments:  Pt educated on rehab plan, safety in mobility, participating in session, increasing standing activities, and weight shift in transfers. Hip  abd/add isometrics, anterior weight shifts, standing B/L weight shifts, HS isometrics, LAQs, hip flexion R AAROM, L marching, B/L resisted ankle DF 2x10. R Scap mobs and UE AAROM performed. R gastroc and HS stretches with contract relax techniques. Transfers x5 in session. Static standing 1"x4 in session. Pt behavior managed by cog rest breaks, encouragement, education, and decreased demands.   See Function Navigator for Current Functional Status.   Therapy/Group: Individual Therapy  Monia Pouch 01/27/2015, 11:31 AM

## 2015-01-27 NOTE — Progress Notes (Signed)
Michelle Holmes PHYSICAL MEDICINE & REHABILITATION     PROGRESS NOTE    Subjective/Complaints:  Patient seen this morning sitting up in bed. She is much more alert today. She states she has a good night, she mentions that she has a question, but cannot think of what to say next.  ROS: Denies CP, SOB, N/V/D ?reliability  Objective: Vital Signs: Blood pressure 122/68, pulse 67, temperature 98.2 F (36.8 C), temperature source Oral, resp. rate 18, weight 99.3 kg (218 lb 14.7 oz), SpO2 97 %. No results found.  Recent Labs  01/26/15 0641  WBC 5.3  HGB 9.3*  HCT 28.4*  PLT 127*    Recent Labs  01/25/15 1300 01/26/15 0641  NA 142 143  K 6.3* 4.6  CL 113* 112*  GLUCOSE 239* 152*  BUN 48* 44*  CREATININE 3.21* 3.01*  CALCIUM 9.9 9.5   CBG (last 3)   Recent Labs  01/26/15 1638 01/26/15 2043 01/27/15 0627  GLUCAP 167* 251* 146*    Wt Readings from Last 3 Encounters:  01/26/15 99.3 kg (218 lb 14.7 oz)  01/14/15 103.193 kg (227 lb 8 oz)  01/12/15 102.513 kg (226 lb)    Physical Exam:  BP 122/68 mmHg  Pulse 67  Temp(Src) 98.2 F (36.8 C) (Oral)  Resp 18  Wt 99.3 kg (218 lb 14.7 oz)  SpO2 97% Constitutional: NAD. Vital signs reviewed.  HENT: Normocephalic and atraumatic.  Eyes: Conjunctivae and EOM are normal.  Cardiovascular: Normal rate and regular rhythm.  Respiratory: Effort normal and breath sounds normal. No respiratory distress. She has no wheezes.  GI: Soft. Bowel sounds are normal. There is no tenderness.  Musculoskeletal: She exhibits no edema or tenderness.  Neurological: She is alert, confused  Has poor postural awareness.  Right facial weakness with dysarthric speech Exam limited by poor participation.  Motor: RUE 0/5 LUE 5/5 LLE 4+/5 RLE 4 minus/5  Right facial weakness Skin: Skin is warm and dry. No erythema.  Psychiatric: Her affect is blunt. Her speech is delayed. She is slowed. Cognition and memory are impaired. She expresses inappropriate  judgment.  Assessment/Plan: 1. 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 Physiatrist is providing close team supervision and 24 hour management of active medical problems listed below. Physiatrist and rehab team continue to assess barriers to discharge/monitor patient progress toward functional and medical goals.  Function:  Bathing Bathing position Bathing activity did not occur: Refused Position: Wheelchair/chair at sink  Bathing parts Body parts bathed by patient: Chest, Abdomen, Right upper leg, Left upper leg Body parts bathed by helper: Right arm, Left arm, Buttocks, Right lower leg, Left lower leg, Back  Bathing assist Assist Level: 2 helpers      Upper Body Dressing/Undressing Upper body dressing Upper body dressing/undressing activity did not occur: Refused What is the patient wearing?: Pull over shirt/dress     Pull over shirt/dress - Perfomed by patient: Pull shirt over trunk Pull over shirt/dress - Perfomed by helper: Thread/unthread right sleeve, Thread/unthread left sleeve, Put head through opening, Pull shirt over trunk        Upper body assist Assist Level:  (Total assist)      Lower Body Dressing/Undressing Lower body dressing   What is the patient wearing?: Pants       Pants- Performed by helper: Thread/unthread right pants leg, Thread/unthread left pants leg, Pull pants up/down  Non-skid slipper socks- Performed by helper: Don/doff right sock, Don/doff left sock                  Lower body assist Assist for lower body dressing:  (Total assist)      Toileting Toileting Toileting activity did not occur: N/A Toileting steps completed by patient: Performs perineal hygiene Toileting steps completed by helper: Adjust clothing prior to toileting, Performs perineal hygiene, Adjust clothing after  toileting Toileting Assistive Devices: Other (comment) (stedy)  Toileting assist Assist level: Two helpers   Transfers Chair/bed transfer   Chair/bed transfer method: Stand pivot Chair/bed transfer assist level: Maximal assist (Pt 25 - 49%/lift and lower) Chair/bed transfer assistive device: Mechanical lift Mechanical lift: Stedy   Locomotion Ambulation Ambulation activity did not occur: Refused   Max distance: 20 Assist level: Maximal assist (Pt 25 - 49%)   Wheelchair Wheelchair activity did not occur: Refused Type: Manual Max wheelchair distance: 5 Assist Level: Maximal assistance (Pt 25 - 49%)  Cognition Comprehension Comprehension assist level: Follows basic conversation/direction with no assist  Expression Expression assist level: Expresses basic 90% of the time/requires cueing < 10% of the time.  Social Interaction Social Interaction assist level: Interacts appropriately 75 - 89% of the time - Needs redirection for appropriate language or to initiate interaction.  Problem Solving Problem solving assist level: Solves basic 25 - 49% of the time - needs direction more than half the time to initiate, plan or complete simple activities  Memory Memory assist level: Recognizes or recalls 25 - 49% of the time/requires cueing 50 - 75% of the time    Medical Problem List and Plan: 1. Dysphagia, Aphasia,right  hemiparesis secondary to Left subthalamic lacunar infarct  Cont CIR 2. DVT Prophylaxis/Anticoagulation: Pharmaceutical: Lovenox 3. Chronic back pain/ Pain Management:lumbar stenosis and scoliosis , severe, No Pain c/o  On lyrica bid. Off OxyContin and ultram  Will usetylenol prn to prevent sedation.  4. Mood: LCSW to follow for evaluation and support.  5. Neuropsych: This patient is not capable of making decisions on her own behalf. 6. Skin/Wound Care: Maintain adequate nutrition and hydration status. Routine pressure relief measures.  7. Fluids/Electrolytes/Nutrition:  Monitor I/O. Offer supplements if intake poor. 10-40% meals  8. COPD: Continue IS. On dulera bid.  9. OSA/OHS: Question use of CPAP at bedtime 10. Dyslipidemia: Continue Lipitor and fenofibrate. 11.DM type 2: Monitor BS ac/hs.    increased lantus to 30 U on 12/28, increased to 35U CBG (last 3)   Recent Labs  01/26/15 1638 01/26/15 2043 01/27/15 0627  GLUCAP 167* 251* 146*   Fasting CBP 146 this AM, however with occasional spikes throughout the day. Will continue to monitor and consider increasing mealtime coverage, if persistently elevated 12. HTN: Monitor and slowly resume home medications.   Within acceptable range this AM 13. CKD stage 4: Baseline creatinine at 2.7 per records review- slightly higher than baseline currently, recheck creat as part of monitoring on lovenox  Continue to Monitor with serial checks. Encourage po intake.   Cr. Improved to 3.01 on 12/31. 14. Anemia  Hb 9.3 on 12/31 15. Thrombocytopenia  Plts 127 on 12/31  LOS (Days) 9 A FACE TO FACE EVALUATION WAS PERFORMED  Ankit [name] 01/27/2015 9:37 AM

## 2015-01-28 ENCOUNTER — Inpatient Hospital Stay (HOSPITAL_COMMUNITY): Payer: Medicare Other | Admitting: Speech Pathology

## 2015-01-28 ENCOUNTER — Inpatient Hospital Stay (HOSPITAL_COMMUNITY): Payer: Medicare Other | Admitting: Physical Therapy

## 2015-01-28 ENCOUNTER — Inpatient Hospital Stay (HOSPITAL_COMMUNITY): Payer: Medicare Other

## 2015-01-28 LAB — GLUCOSE, CAPILLARY
Glucose-Capillary: 137 mg/dL — ABNORMAL HIGH (ref 65–99)
Glucose-Capillary: 156 mg/dL — ABNORMAL HIGH (ref 65–99)
Glucose-Capillary: 193 mg/dL — ABNORMAL HIGH (ref 65–99)
Glucose-Capillary: 220 mg/dL — ABNORMAL HIGH (ref 65–99)
Glucose-Capillary: 199 mg/dL — ABNORMAL HIGH (ref 65–99)

## 2015-01-28 MED ORDER — GLIPIZIDE 5 MG PO TABS
2.5000 mg | ORAL_TABLET | Freq: Every day | ORAL | Status: DC
  Administered 2015-01-28 – 2015-01-30 (×3): 2.5 mg via ORAL
  Filled 2015-01-28 (×3): qty 1

## 2015-01-28 NOTE — Progress Notes (Signed)
Speech Language Pathology Daily Session Note  Patient Details  Name: Michelle Holmes MRN: [id number] Date of Birth: 1950-02-13  Today's Date: 01/28/2015 SLP Individual Time: 1345-1430 SLP Individual Time Calculation (min): 45 min  Short Term Goals: Week 2: SLP Short Term Goal 1 (Week 2): Pt will consume dys 3 textures and thin liquids with supervision cues to monitor and correct oral residue and rate of self feeding over 3 targeted sessions.  SLP Short Term Goal 2 (Week 2): Pt will sustain her attention to basic familiar tasks for 3-5 minutes with min verbal cues for redirection.  SLP Short Term Goal 3 (Week 2): Pt will be intelligible at the phrase level in 75% of opportunities with max assist verbal cues.   SLP Short Term Goal 4 (Week 2): Pt will consume trials of regular textures with min verbal cues for use of swallowing precautions over 2 targeted sessions prior to advancement.   Skilled Therapeutic Interventions: Skilled treatment session focused on addressing dysphagia and dysarthria goals. SLP facilitated session by providing set-up assist of regular textures and thin liquids with Max assist verbal and visual cues for portion control and pacing as well as to refrain from talking while eating.  Patient with no coughing but oral phase was significantly prolonged and patient has limited to no safety awareness with self-feeding task.  SLP also facilitated session with Max assist verbal cues to achieve 50-75% accuracy with phrase level verbal expression.  Of note, patient appeared frustrated by cuing.  Continue with current plan of care.    Function:  Eating Eating   Modified Consistency Diet: No (with trials with SLP) Eating Assist Level: Set up assist for;Supervision or verbal cues;Helper checks for pocketed food   Eating Set Up Assist For: Opening containers       Cognition Comprehension Comprehension assist level: Understands basic 90% of the time/cues < 10% of the time  Expression    Expression assist level: Expresses basic 75 - 89% of the time/requires cueing 10 - 24% of the time. Needs helper to occlude trach/needs to repeat words.  Social Interaction Social Interaction assist level: Interacts appropriately 50 - 74% of the time - May be physically or verbally inappropriate.  Problem Solving Problem solving assist level: Solves basic 25 - 49% of the time - needs direction more than half the time to initiate, plan or complete simple activities  Memory Memory assist level: Recognizes or recalls 25 - 49% of the time/requires cueing 50 - 75% of the time    Pain Pain Assessment Pain Assessment: No/denies pain  Therapy/Group: Individual Therapy  Carmelia Roller., Helper  [place] 01/28/2015, 6:37 PM

## 2015-01-28 NOTE — Progress Notes (Signed)
[facility] PHYSICAL MEDICINE & REHABILITATION     PROGRESS NOTE    Subjective/Complaints:  Pt states " I need to pay my bills"  ROS: limited by mental status   Objective: Vital Signs: Blood pressure 140/74, pulse 61, temperature 98.5 F (36.9 C), temperature source Oral, resp. rate 18, weight 100.9 kg (222 lb 7.1 oz), SpO2 99 %. No results found.  Recent Labs  01/26/15 0641  WBC 5.3  HGB 9.3*  HCT 28.4*  PLT 127*    Recent Labs  01/25/15 1300 01/26/15 0641  NA 142 143  K 6.3* 4.6  CL 113* 112*  GLUCOSE 239* 152*  BUN 48* 44*  CREATININE 3.21* 3.01*  CALCIUM 9.9 9.5   CBG (last 3)   Recent Labs  01/27/15 1645 01/27/15 2109 01/28/15 0705  GLUCAP 208* 215* 137*    Wt Readings from Last 3 Encounters:  01/28/15 100.9 kg (222 lb 7.1 oz)  01/14/15 103.193 kg (227 lb 8 oz)  01/12/15 102.513 kg (226 lb)    Physical Exam:  BP 140/74 mmHg  Pulse 61  Temp(Src) 98.5 F (36.9 C) (Oral)  Resp 18  Wt 100.9 kg (222 lb 7.1 oz)  SpO2 99% Constitutional: NAD. Vital signs reviewed.  HENT: Normocephalic and atraumatic.  Eyes: Conjunctivae and EOM are normal.  Cardiovascular: Normal rate and regular rhythm.  Respiratory: Effort normal and breath sounds normal. No respiratory distress. She has no wheezes.  GI: Soft. Bowel sounds are normal. There is no tenderness.  Musculoskeletal: She exhibits no edema or tenderness.  Neurological: She is alert, confused , not oriented  Right facial weakness with dysarthric speech Exam limited by poor participation.  Motor: RUE 0/5 LUE 5/5 LLE 4+/5 RLE 4 minus/5  Right facial weakness Skin: Skin is warm and dry. No erythema.  Psychiatric: Her affect is blunt. Her speech is delayed. She is slowed. Cognition and memory are impaired. She expresses inappropriate judgment.  Assessment/Plan: 1. 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 Physiatrist is providing close team supervision and 24 hour management of active medical problems listed below. Physiatrist and rehab team continue to assess barriers to discharge/monitor patient progress toward functional and medical goals.  Function:  Bathing Bathing position Bathing activity did not occur: Refused Position: Wheelchair/chair at sink  Bathing parts Body parts bathed by patient: Chest, Abdomen, Right upper leg, Left upper leg Body parts bathed by helper: Right arm, Left arm, Buttocks, Right lower leg, Left lower leg, Back  Bathing assist Assist Level: 2 helpers      Upper Body Dressing/Undressing Upper body dressing Upper body dressing/undressing activity did not occur: Refused What is the patient wearing?: Pull over shirt/dress     Pull over shirt/dress - Perfomed by patient: Pull shirt over trunk Pull over shirt/dress - Perfomed by helper: Thread/unthread right sleeve, Thread/unthread left sleeve, Put head through opening, Pull shirt over trunk        Upper body assist Assist Level:  (Total assist)      Lower Body Dressing/Undressing Lower body dressing   What is the patient wearing?: Pants       Pants- Performed by helper: Thread/unthread right pants leg, Thread/unthread left pants leg, Pull pants up/down   Non-skid slipper socks- Performed by helper: Don/doff right sock, Don/doff left sock  Lower body assist Assist for lower body dressing:  (Total assist)      Toileting Toileting Toileting activity did not occur: N/A Toileting steps completed by patient: Performs perineal hygiene Toileting steps completed by helper: Adjust clothing prior to toileting, Performs perineal hygiene, Adjust clothing after toileting Toileting Assistive Devices: Grab bar or rail  Toileting assist Assist level: Touching or steadying assistance (Pt.75%)   Transfers Chair/bed transfer    Chair/bed transfer method: Stand pivot Chair/bed transfer assist level: Maximal assist (Pt 25 - 49%/lift and lower) Chair/bed transfer assistive device: Mechanical lift Mechanical lift: Stedy   Locomotion Ambulation Ambulation activity did not occur: Refused   Max distance: 20 Assist level: Maximal assist (Pt 25 - 49%)   Wheelchair Wheelchair activity did not occur: Refused Type: Manual Max wheelchair distance: 5 Assist Level: Maximal assistance (Pt 25 - 49%)  Cognition Comprehension Comprehension assist level: Follows basic conversation/direction with no assist  Expression Expression assist level: Expresses basic 90% of the time/requires cueing < 10% of the time.  Social Interaction Social Interaction assist level: Interacts appropriately 75 - 89% of the time - Needs redirection for appropriate language or to initiate interaction.  Problem Solving Problem solving assist level: Solves basic 25 - 49% of the time - needs direction more than half the time to initiate, plan or complete simple activities  Memory Memory assist level: Recognizes or recalls 25 - 49% of the time/requires cueing 50 - 75% of the time    Medical Problem List and Plan: 1. Dysphagia, Aphasia,right  hemiparesis secondary to Left subthalamic lacunar infarct  Cont CIR 2. DVT Prophylaxis/Anticoagulation: Pharmaceutical: Lovenox 3. Chronic back pain/ Pain Management:lumbar stenosis and scoliosis , severe, No Pain c/o  On lyrica bid. Off OxyContin and ultram  Will usetylenol prn to prevent sedation.  4. Mood: LCSW to follow for evaluation and support.  5. Neuropsych: This patient is not capable of making decisions on her own behalf. 6. Skin/Wound Care: Maintain adequate nutrition and hydration status. Routine pressure relief measures.  7. Fluids/Electrolytes/Nutrition: Monitor I/O. Offer supplements if intake poor. 60-100% meals Fluid intake <377ml recorded 8. COPD: Continue IS. On dulera bid.  9. OSA/OHS:  Question use of CPAP at bedtime 10. Dyslipidemia: Continue Lipitor and fenofibrate. 11.DM type 2: Monitor BS ac/hs.    increased lantus to 30 U on 12/28, increased to 35U, trial low dose glipizide in ams to address daytime elevation CBG (last 3)   Recent Labs  01/27/15 1645 01/27/15 2109 01/28/15 0705  GLUCAP 208* 215* 137*   Fasting CBP 146 this AM, however with occasional spikes throughout the day. Will continue to monitor and consider increasing mealtime coverage, if persistently elevated 12. HTN: Monitor and slowly resume home medications.   Within acceptable range this AM 13. CKD stage 4: Baseline creatinine at 2.7 per records review- slightly higher than baseline currently, recheck creat as part of monitoring on lovenox  Continue to Monitor with serial checks. Encourage po intake.   Cr. Improved to 3.01 on 12/31.some improvement IVF 14. Anemia  Hb 9.3 on 12/31- check stool guaic 15. Thrombocytopenia-Mild may be lovenox related  Plts 127 on 12/31  LOS (Days) 10 A FACE TO FACE EVALUATION WAS PERFORMED  KIRSTEINS,ANDREW E 01/28/2015 9:08 AM

## 2015-01-28 NOTE — Progress Notes (Signed)
Occupational Therapy Session Note  Patient Details  Name: Michelle Holmes MRN: [id number] Date of Birth: 1950-09-20  Today's Date: 01/28/2015 OT Individual Time: 1300-1330 OT Individual Time Calculation (min): 30 min   Short Term Goals: Week 2:  OT Short Term Goal 1 (Week 2): Pt will complete UB bathing with mod assist OT Short Term Goal 2 (Week 2): Pt will complete UB dressing with mod assist OT Short Term Goal 3 (Week 2): Pt will complete squat pivot transfer to drop arm BSC with mod assist OT Short Term Goal 4 (Week 2): Pt will complete sit > stand at sink in preparation for grooming tasks with mod assist OT Short Term Goal 5 (Week 2): Pt will don pants at bed level with mod assist  Skilled Therapeutic Interventions/Progress Updates: Therapeutic activity with focus on improved selective attention and participation in treatment, intellectual awareness, and problem-solving.   Pt received seated in w/c and not receptive for planned session stating she would not participate anymore without family also present.   With extra time and motivational cues/prompts to select her own activity, pt was willing to engage in a continued investigation of the features of her new personal electronic tablet.   Tablet was provided by family members some time ago (Xmas?) however pt was unable to activate device or understand concepts relating to magnification and manipulation of screen using her left hand.   OT provided setup to connect to WiFi source on topic of pt's interest, "[place]" (religious personality), which motivated pt to attend to subject with improved disposition and participation.   Pt enjoyed a partial presentation entitled "Hold My Mule" for 9:26 and sustained her attention until end of session.   Therapy ended d/t fatigue however pt agreed to participate in therapies as recommended with greater enthusiasm.        Therapy Documentation Precautions:  Precautions Precautions: Fall Precaution  Comments: R hemiparesis Restrictions Weight Bearing Restrictions: No   General: General OT Amount of Missed Time: 15 Minutes  Pain: No/denies pain   See Function Navigator for Current Functional Status.   Therapy/Group: Individual Therapy  Bradan Congrove 01/28/2015, 1:42 PM

## 2015-01-28 NOTE — Progress Notes (Signed)
Physical Therapy Session Note  Patient Details  Name: Michelle Holmes MRN: [id number] Date of Birth: 1950-10-11  Today's Date: 01/28/2015 PT Individual Time: 0900-0957 PT Individual Time Calculation (min): 57 min   Short Term Goals: Week 2:  PT Short Term Goal 1 (Week 2): Pt will perform supine <>sit with S using hospital bed functions PT Short Term Goal 2 (Week 2): Pt will perform squat pivot transfer consistent modA to R/L side PT Short Term Goal 3 (Week 2): Pt will perform gait with LRAD x30' modA PT Short Term Goal 4 (Week 2): Pt will propel w/c with BUE 50' modA  Skilled Therapeutic Interventions/Progress Updates:    Pt received seated in w/c with no c/o pain and agreeable to treatment with encouragement. Initially declined leaving room, however with education regarding practicing mobility tasks to prepare for d/c and go home pt is more agreeable. Transfer w/c <>bed modA +2. Sit >supine minA for RLE management. Supine>sit modA to push trunk up to neutral due to body habitus, UE and core weakness. Gait with L rail x20', variable min>totalA for RLE progression due to inattention, decreased motivation. Noted extreme external rotation in RLE, and therapist attempted to correct and cue pt for toes forward and pt states that is how she has always walked and there is nothing wrong with it. Transfer w/c <>car with modA +2 for safety, cues for hand and foot placement. Therapeutic rest break to improve compliance and participation. Seated activity with pt engaged in tic-tac-toe; initially encouraged pt to perform in standing and pt unwilling. Played several games in sitting to allow pt to relax, then attempted again to encourage pt to stand and pt continues to decline stating she will do it tomorrow. Educated pt on importance of practicing functional mobility tasks to improve independence and safety. Pt returned to room totalA and remained seated in w/c; NA alerted to pt request to use restroom and NA present  as therapist leaving room.  Therapy Documentation Precautions:  Precautions Precautions: Fall Precaution Comments: R hemiparesis Restrictions Weight Bearing Restrictions: No Pain: Pain Assessment Pain Assessment: No/denies pain Pain Score: 0-No pain   See Function Navigator for Current Functional Status.   Therapy/Group: Individual Therapy  Luberta Mutter 01/28/2015, 10:20 AM

## 2015-01-28 NOTE — Progress Notes (Signed)
Occupational Therapy Session Note  Patient Details  Name: Michelle Holmes MRN: [id number] Date of Birth: 06-12-1950  Today's Date: 01/28/2015 OT Individual Time: 1030-1130 OT Individual Time Calculation (min): 60 min    Short Term Goals: Week 2:  OT Short Term Goal 1 (Week 2): Pt will complete UB bathing with mod assist OT Short Term Goal 2 (Week 2): Pt will complete UB dressing with mod assist OT Short Term Goal 3 (Week 2): Pt will complete squat pivot transfer to drop arm BSC with mod assist OT Short Term Goal 4 (Week 2): Pt will complete sit > stand at sink in preparation for grooming tasks with mod assist OT Short Term Goal 5 (Week 2): Pt will don pants at bed level with mod assist  Skilled Therapeutic Interventions/Progress Updates:    Pt resting in w/c upon arrival.  Pt reluctant to participate in therapy and refused bathing and dressing tasks.  Pt also refused to practice standing.  Pt stated that the day was a "good day for laying in bed." Pt stated all she wanted to do was "go home" and was insistent she could take care of herself.  Pt admitted that her RUE was "not working right." Pt demonstrated difficulty operating her tablet and required max A to turn on.  Pt engaged in [place] and therapeutic tasks while seated in w/c.  Focus on activity tolerance, active participation, discharge planning, RUE NMR, and safety awareness to increase independence with BADLs.  Therapy Documentation Precautions:  Precautions Precautions: Fall Precaution Comments: R hemiparesis Restrictions Weight Bearing Restrictions: No   Pain: Pain Assessment Pain Assessment: No/denies pain Pain Score: 0-No pain  See Function Navigator for Current Functional Status.   Therapy/Group: Individual Therapy  Leroy Libman 01/28/2015, 12:07 PM

## 2015-01-29 ENCOUNTER — Inpatient Hospital Stay (HOSPITAL_COMMUNITY): Payer: Medicare Other | Admitting: Physical Therapy

## 2015-01-29 ENCOUNTER — Inpatient Hospital Stay (HOSPITAL_COMMUNITY): Payer: Medicare Other | Admitting: *Deleted

## 2015-01-29 ENCOUNTER — Inpatient Hospital Stay (HOSPITAL_COMMUNITY): Payer: Self-pay | Admitting: Occupational Therapy

## 2015-01-29 ENCOUNTER — Inpatient Hospital Stay (HOSPITAL_COMMUNITY): Payer: Medicare Other | Admitting: Speech Pathology

## 2015-01-29 LAB — GLUCOSE, CAPILLARY
Glucose-Capillary: 100 mg/dL — ABNORMAL HIGH (ref 65–99)
Glucose-Capillary: 95 mg/dL (ref 65–99)
Glucose-Capillary: 204 mg/dL — ABNORMAL HIGH (ref 65–99)
Glucose-Capillary: 172 mg/dL — ABNORMAL HIGH (ref 65–99)

## 2015-01-29 NOTE — Progress Notes (Signed)
[place] PHYSICAL MEDICINE & REHABILITATION     PROGRESS NOTE    Subjective/Complaints:  Discussed with RN, lost IV site, RN reports pt will drink fluids if offered  I need to go pay bills  ROS: limited by mental status   Objective: Vital Signs: Blood pressure 128/74, pulse 73, temperature 98.2 F (36.8 C), temperature source Oral, resp. rate 18, weight 102.1 kg (225 lb 1.4 oz), SpO2 100 %. No results found. No results for input(s): WBC, HGB, HCT, PLT in the last 72 hours. No results for input(s): NA, K, CL, GLUCOSE, BUN, CREATININE, CALCIUM in the last 72 hours.  Invalid input(s): CO CBG (last 3)   Recent Labs  01/28/15 2025 01/28/15 2104 01/29/15 0713  GLUCAP 220* 199* 100*    Wt Readings from Last 3 Encounters:  01/29/15 102.1 kg (225 lb 1.4 oz)  01/14/15 103.193 kg (227 lb 8 oz)  01/12/15 102.513 kg (226 lb)    Physical Exam:  BP 128/74 mmHg  Pulse 73  Temp(Src) 98.2 F (36.8 C) (Oral)  Resp 18  Wt 102.1 kg (225 lb 1.4 oz)  SpO2 100% Constitutional: NAD. Vital signs reviewed.  HENT: Normocephalic and atraumatic.  Eyes: Conjunctivae and EOM are normal.  Cardiovascular: Normal rate and regular rhythm.  Respiratory: Effort normal and breath sounds normal. No respiratory distress. She has no wheezes.  GI: Soft. Bowel sounds are normal. There is no tenderness.  Musculoskeletal: She exhibits no edema or tenderness.  Neurological: She is alert, confused , not oriented  Right facial weakness with dysarthric speech Exam limited by poor participation.  Motor: RUE 0/5 LUE 5/5 LLE 4+/5 RLE 4 minus/5  Right facial weakness Skin: Skin is warm and dry. No erythema.  Psychiatric: Her affect is blunt. Her speech is delayed. She is slowed. Cognition and memory are impaired. She expresses inappropriate judgment.  Assessment/Plan: 1. 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 Physiatrist is providing close team supervision and 24 hour management of active medical problems listed below. Physiatrist and rehab team continue to assess barriers to discharge/monitor patient progress toward functional and medical goals.  Function:  Bathing Bathing position Bathing activity did not occur: Refused Position: Wheelchair/chair at sink  Bathing parts Body parts bathed by patient: Chest, Abdomen, Right upper leg, Left upper leg Body parts bathed by helper: Right arm, Left arm, Buttocks, Right lower leg, Left lower leg, Back  Bathing assist Assist Level: 2 helpers      Upper Body Dressing/Undressing Upper body dressing Upper body dressing/undressing activity did not occur: Refused What is the patient wearing?: Pull over shirt/dress     Pull over shirt/dress - Perfomed by patient: Pull shirt over trunk Pull over shirt/dress - Perfomed by helper: Thread/unthread right sleeve, Thread/unthread left sleeve, Put head through opening, Pull shirt over trunk        Upper body assist Assist Level:  (Total assist)      Lower Body Dressing/Undressing Lower body dressing   What is the patient wearing?: Pants       Pants- Performed by helper: Thread/unthread right pants leg, Thread/unthread left pants leg, Pull pants up/down   Non-skid slipper socks- Performed by helper: Don/doff right sock, Don/doff left sock                  Lower body assist Assist for lower body dressing:  (Total assist)  Toileting Toileting Toileting activity did not occur: N/A Toileting steps completed by patient: Performs perineal hygiene Toileting steps completed by helper: Adjust clothing prior to toileting, Performs perineal hygiene, Adjust clothing after toileting Toileting Assistive Devices: Grab bar or rail  Toileting assist Assist level: Touching or steadying assistance (Pt.75%)   Transfers Chair/bed transfer    Chair/bed transfer method: Squat pivot Chair/bed transfer assist level: Moderate assist (Pt 50 - 74%/lift or lower) Chair/bed transfer assistive device: Armrests Mechanical lift: Ecologist Ambulation activity did not occur: Refused   Max distance: 20 Assist level: 2 helpers   Oceanographer activity did not occur: Refused Type: Manual Max wheelchair distance: 5 Assist Level: Maximal assistance (Pt 25 - 49%)  Cognition Comprehension Comprehension assist level: Understands basic 90% of the time/cues < 10% of the time  Expression Expression assist level: Expresses basic 75 - 89% of the time/requires cueing 10 - 24% of the time. Needs helper to occlude trach/needs to repeat words.  Social Interaction Social Interaction assist level: Interacts appropriately 50 - 74% of the time - May be physically or verbally inappropriate.  Problem Solving Problem solving assist level: Solves basic 25 - 49% of the time - needs direction more than half the time to initiate, plan or complete simple activities  Memory Memory assist level: Recognizes or recalls 25 - 49% of the time/requires cueing 50 - 75% of the time    Medical Problem List and Plan: 1. Dysphagia, Aphasia,right  hemiparesis secondary to Left subthalamic lacunar infarct  Cont CIR 2. DVT Prophylaxis/Anticoagulation: Pharmaceutical: Lovenox 3. Chronic back pain/ Pain Management:lumbar stenosis and scoliosis , severe, No Pain c/o  On lyrica bid. Off OxyContin and ultram  Will usetylenol prn to prevent sedation.  4. Mood: LCSW to follow for evaluation and support.  5. Neuropsych: This patient is not capable of making decisions on her own behalf.Pt states she needs to pay bills, will ask SW to coordinate family to help with this                                                                                                                                                                                                                   6. Skin/Wound Care: Maintain adequate nutrition and hydration status. Routine pressure relief measures.  7. Fluids/Electrolytes/Nutrition: Monitor I/O. Offer supplements if intake poor. 100% meal, lost IV site but pt drinks if offered fluid, repeat BMET im am 8. COPD: Continue IS. On dulera bid.  9. OSA/OHS: Question use  of CPAP at bedtime 10. Dyslipidemia: Continue Lipitor and fenofibrate. 11.DM type 2: Monitor BS ac/hs.    increased lantus to 30 U on 12/28, increased to 35U, am CBG at goal CBG (last 3)   Recent Labs  01/28/15 2025 01/28/15 2104 01/29/15 0713  GLUCAP 220* 199* 100*    12. HTN: Monitor and slowly resume home medications.   Within acceptable range this AM 13. CKD stage 4: Baseline creatinine at 2.7 per records review- slightly higher than baseline currently, recheck creat as part of monitoring on lovenox  Continue to Monitor with serial checks. Encourage po intake.   Cr. Improved to 3.01 on 12/31.some improvement IVF 14. Anemia  Hb 9.3 on 12/31- check stool guaic 15. Thrombocytopenia-Mild may be lovenox related  Plts 127 on 12/31-CBC  LOS (Days) 11 A FACE TO FACE EVALUATION WAS PERFORMED  Damarius Karnes E 01/29/2015 7:22 AM

## 2015-01-29 NOTE — Progress Notes (Signed)
Occupational Therapy Session Note  Patient Details  Name: Michelle Holmes MRN: [id number] Date of Birth: 09-13-1950  Today's Date: 01/29/2015 OT Individual Time: 1000-1030 OT Individual Time Calculation (min): 30 min    Skilled Therapeutic Interventions/Progress Updates:    1:1 Pt agreeable to dress sit to stand but only with the STEDY.  Pt with active movement with right shoulder internal rotation, elbow flexion and supination (syngery pattern) and right LE extension and hip flexion.  However pt not willing ot try to stand without use of STEDY. She reported "i have never done that; I will never do that." Despite numerous ways to encourage her we used the STEDY and she was able to stand with min A. Pt able to stand with min A while pt used left hand to pull up pants.  Focus on controlled decent into the chair with UE support. Pt limiting herself (due to decr cognition) to how much we can progress her in a session.   Therapy Documentation Precautions:  Precautions Precautions: Fall Precaution Comments: R hemiparesis Restrictions Weight Bearing Restrictions: No Pain: Pain Assessment Pain Assessment: No/denies pain Pain Score: 0-No pain  See Function Navigator for Current Functional Status.   Therapy/Group: Individual Therapy  Willeen Cass [facility] 01/29/2015, 2:47 PM

## 2015-01-29 NOTE — Progress Notes (Signed)
Speech Language Pathology Daily Session Note  Patient Details  Name: Michelle Holmes MRN: [id number] Date of Birth: 12-12-1950  Today's Date: 01/29/2015 SLP Individual Time: 0902-1000 SLP Individual Time Calculation (min): 58 min  Short Term Goals: Week 1: SLP Short Term Goal 1 (Week 1): Pt will consume dys 3 textures and thin liquids with min assist verbal cues to monitor and correct oral residue and rate of self feeding over 3 targeted sessions.  SLP Short Term Goal 1 - Progress (Week 1): Met SLP Short Term Goal 2 (Week 1): Pt will sustain her attention to basic familiar tasks for 3-5 minutes with mod verbal cues for redirection.  SLP Short Term Goal 2 - Progress (Week 1): Met SLP Short Term Goal 3 (Week 1): Pt will be intelligible at the phrase level in 75% of opportunities with max assist verbal cues.   SLP Short Term Goal 3 - Progress (Week 1): Other (comment) (not addressed this reporting period due to cognitive deficits )  Skilled Therapeutic Interventions:  Pt was seen for skilled ST targeting goals for cognition and dysphagia.  Pt required max encouragement for participation in therapies outside of her room but was eventually agreeable to participate in structured tasks.  SLP faciltiated the session with a novel card game targeting sustained attention to task.  Pt was able to sustain her attention in a minimally distracting environment for 5 minute intervals with min-mod verbal cues for redirection.  SLP also completed skilled observations during presentations of pt's currently prescribed diet.  Pt demonstrated increased oral residue due to impulsivity with rate and portion control as well as decreased attention to boluses and required mod-max assist verbal cues for use of swallowing precautions.  Immediate cough x1 following large bolus of solids.  Smaller bites and sips were tolerated well without overt s/s of aspiration.  Recommend that pt remain on dys 3 solids but SLP will follow up for  trials of advanced textures.  Pt was returned to room and left with quick release belt donned and call bell left within reach.  Continue per current plan of care.    Function:  Eating Eating   Modified Consistency Diet: Yes Eating Assist Level: Set up assist for;Supervision or verbal cues;Helper checks for pocketed food   Eating Set Up Assist For: Opening containers       Cognition Comprehension Comprehension assist level: Understands basic 90% of the time/cues < 10% of the time  Expression   Expression assist level: Expresses basic 75 - 89% of the time/requires cueing 10 - 24% of the time. Needs helper to occlude trach/needs to repeat words.  Social Interaction Social Interaction assist level: Interacts appropriately 50 - 74% of the time - May be physically or verbally inappropriate.  Problem Solving Problem solving assist level: Solves basic 25 - 49% of the time - needs direction more than half the time to initiate, plan or complete simple activities  Memory Memory assist level: Recognizes or recalls 25 - 49% of the time/requires cueing 50 - 75% of the time    Pain Pain Assessment Pain Assessment: No/denies pain Pain Score: 0-No pain  Therapy/Group: Individual Therapy  Prabhav Faulkenberry, Selinda Orion 01/29/2015, 12:34 PM

## 2015-01-29 NOTE — Progress Notes (Signed)
Physical Therapy Session Note  Patient Details  Name: Michelle Holmes MRN: [id number] Date of Birth: 07/22/1950  Today's Date: 01/29/2015 PT Individual Time: 1300-1400 PT Individual Time Calculation (min): 60 min   Short Term Goals: Week 2:  PT Short Term Goal 1 (Week 2): Pt will perform supine <>sit with S using hospital bed functions PT Short Term Goal 2 (Week 2): Pt will perform squat pivot transfer consistent modA to R/L side PT Short Term Goal 3 (Week 2): Pt will perform gait with LRAD x30' modA PT Short Term Goal 4 (Week 2): Pt will propel w/c with BUE 50' modA  Skilled Therapeutic Interventions/Progress Updates:    Pt received seated in w/c with handoff from NA to supervise pt eating lunch, and pt declining any therapy until finished with lunch. Required min cues for smaller bites, not talking while eating, small sips of drink. Pt declining out of room at this time. With Stedy performed sit <>stand with minA, and 5 trials of standing while performing LUE fine motor task playing connect 4. Able to stand approximately 1 min at a time before requiring seated rest break on Stedy. Mod verbal/tactile cues for upright posture with RLE extension, and placement of LUE for weight bearing and protection. Pt encouraged to perform additional mobility tasks, giving pt option between two activities however pt continues to decline any mobility, stating she "doesn't feel very good today". Able to convince pt to perform 2 additional standing trials while playing game, then returned to sitting in w/c. Pt becomes very emotional, stating her children have not been to visit in two days. Offered emotional support, and contacted CSW to locate pt's daughter's phone number so she could call her.  Pt remained seated in w/c with quick release belt intact and all needs in reach at completion of session.  Therapy Documentation Precautions:  Precautions Precautions: Fall Precaution Comments: R  hemiparesis Restrictions Weight Bearing Restrictions: No Pain: Pain Assessment Pain Assessment: No/denies pain Pain Score: 0-No pain   See Function Navigator for Current Functional Status.   Therapy/Group: Individual Therapy  Luberta Mutter 01/29/2015, 3:06 PM

## 2015-01-29 NOTE — Progress Notes (Signed)
Occupational Therapy Session Note  Patient Details  Name: Michelle Holmes MRN: [id number] Date of Birth: Apr 28, 1950  Today's Date: 01/29/2015 OT Individual Time: 1100-1200 OT Individual Time Calculation (min): 60 min    Short Term Goals: Week 2:  OT Short Term Goal 1 (Week 2): Pt will complete UB bathing with mod assist OT Short Term Goal 2 (Week 2): Pt will complete UB dressing with mod assist OT Short Term Goal 3 (Week 2): Pt will complete squat pivot transfer to drop arm BSC with mod assist OT Short Term Goal 4 (Week 2): Pt will complete sit > stand at sink in preparation for grooming tasks with mod assist OT Short Term Goal 5 (Week 2): Pt will don pants at bed level with mod assist  Skilled Therapeutic Interventions/Progress Updates:    Pt resting in w/c upon arrival.  Pt declined going to gym but agreeable to engaging in standing activities in room.  Pt became emotional re: children not coming in for family education, etc.  Emotional support provided.  Pt stood with Stedy to engage in playing a game of Connect 4. Pt stood 5 mins X 4 with sitting rest breaks on Stedy. Pt required mod verbal cues to block OT from winning game.  Pt required min multimodal cues for weight shifts and RUE positioning. Pt pleased with standing in [place] but declined attempting to stand without Stedy. Focus on activity tolerance, sit<>stand, standing balance, and active participation.   Therapy Documentation Precautions:  Precautions Precautions: Fall Precaution Comments: R hemiparesis Restrictions Weight Bearing Restrictions: No   Pain: Pain Assessment Pain Assessment: 0-10 Pain Score: 0-No pain  See Function Navigator for Current Functional Status.   Therapy/Group: Individual Therapy  Leroy Libman 01/29/2015, 12:01 PM

## 2015-01-30 ENCOUNTER — Inpatient Hospital Stay (HOSPITAL_COMMUNITY): Payer: Medicare Other | Admitting: Occupational Therapy

## 2015-01-30 ENCOUNTER — Inpatient Hospital Stay (HOSPITAL_COMMUNITY): Payer: Medicare Other | Admitting: Speech Pathology

## 2015-01-30 ENCOUNTER — Inpatient Hospital Stay (HOSPITAL_COMMUNITY): Payer: Self-pay

## 2015-01-30 LAB — GLUCOSE, CAPILLARY
Glucose-Capillary: 99 mg/dL (ref 65–99)
Glucose-Capillary: 102 mg/dL — ABNORMAL HIGH (ref 65–99)
Glucose-Capillary: 150 mg/dL — ABNORMAL HIGH (ref 65–99)
Glucose-Capillary: 176 mg/dL — ABNORMAL HIGH (ref 65–99)

## 2015-01-30 LAB — BASIC METABOLIC PANEL
Anion gap: 10 (ref 5–15)
BUN: 40 mg/dL — ABNORMAL HIGH (ref 6–20)
CO2: 22 mmol/L (ref 22–32)
Calcium: 9.7 mg/dL (ref 8.9–10.3)
Chloride: 115 mmol/L — ABNORMAL HIGH (ref 101–111)
Creatinine, Ser: 2.9 mg/dL — ABNORMAL HIGH (ref 0.44–1.00)
GFR calc Af Amer: 19 mL/min — ABNORMAL LOW (ref >60)
GFR calc non Af Amer: 16 mL/min — ABNORMAL LOW (ref >60)
Glucose, Bld: 94 mg/dL (ref 65–99)
Potassium: 4.3 mmol/L (ref 3.5–5.1)
Sodium: 147 mmol/L — ABNORMAL HIGH (ref 135–145)

## 2015-01-30 LAB — CBC
HCT: 29.4 % — ABNORMAL LOW (ref 36.0–46.0)
Hemoglobin: 9.6 g/dL — ABNORMAL LOW (ref 12.0–15.0)
MCH: 30.5 pg (ref 26.0–34.0)
MCHC: 32.7 g/dL (ref 30.0–36.0)
MCV: 93.3 fL (ref 78.0–100.0)
Platelets: 149 10*3/uL — ABNORMAL LOW (ref 150–400)
RBC: 3.15 MIL/uL — ABNORMAL LOW (ref 3.87–5.11)
RDW: 14.5 % (ref 11.5–15.5)
WBC: 4.6 10*3/uL (ref 4.0–10.5)

## 2015-01-30 MED ORDER — GLIPIZIDE 5 MG PO TABS
5.0000 mg | ORAL_TABLET | Freq: Every day | ORAL | Status: DC
  Administered 2015-01-31 – 2015-02-07 (×8): 5 mg via ORAL
  Filled 2015-01-30 (×9): qty 1

## 2015-01-30 NOTE — Progress Notes (Signed)
Speech Language Pathology Daily Session Note  Patient Details  Name: Michelle Holmes MRN: [id number] Date of Birth: Nov 17, 1950  Today's Date: 01/30/2015 SLP Individual Time: 0240-9735 SLP Individual Time Calculation (min): 55 min  Short Term Goals: Week 1: SLP Short Term Goal 1 (Week 1): Pt will consume dys 3 textures and thin liquids with min assist verbal cues to monitor and correct oral residue and rate of self feeding over 3 targeted sessions.  SLP Short Term Goal 1 - Progress (Week 1): Met SLP Short Term Goal 2 (Week 1): Pt will sustain her attention to basic familiar tasks for 3-5 minutes with mod verbal cues for redirection.  SLP Short Term Goal 2 - Progress (Week 1): Met SLP Short Term Goal 3 (Week 1): Pt will be intelligible at the phrase level in 75% of opportunities with max assist verbal cues.   SLP Short Term Goal 3 - Progress (Week 1): Other (comment) (not addressed this reporting period due to cognitive deficits )  Skilled Therapeutic Interventions:  Pt was seen for skilled ST targeting cognitive goals.  Pt required max encouragement to participate in therapies but was able to sort money into groups by value with min assist verbal cues.  Pt became verbally agitated with therapist with further attempts to engage pt in structured tasks.  SLP provided explanation of rationale behind ST services in the setting of new CVA and asked for pt's input regarding personally meaningful therapeutic activities.  Pt reported that she enjoyed baking prior to admission (despite previous reports that she did not cook or clean) and was able to recall components of a cookie recipe with mod assist question cues.  SLP will attempt a basic kitchen task at next available appointment.  Pt was returned to room and left with call bell within reach and quick release belt donned for safety.    Function:  Eating Eating                 Cognition Comprehension Comprehension assist level: Follows basic  conversation/direction with extra time/assistive device  Expression   Expression assist level: Expresses basic 50 - 74% of the time/requires cueing 25 - 49% of the time. Needs to repeat parts of sentences.  Social Interaction Social Interaction assist level: Interacts appropriately 50 - 74% of the time - May be physically or verbally inappropriate.  Problem Solving Problem solving assist level: Solves basic 25 - 49% of the time - needs direction more than half the time to initiate, plan or complete simple activities  Memory Memory assist level: Recognizes or recalls 25 - 49% of the time/requires cueing 50 - 75% of the time    Pain Pain Assessment Pain Assessment: No/denies pain  Therapy/Group: Individual Therapy  Srinika Delone, Selinda Orion 01/30/2015, 12:34 PM

## 2015-01-30 NOTE — Progress Notes (Signed)
Social Work Patient ID: Michelle Holmes, female   DOB: 10/04/1950, 65 y.o.   MRN: [id number] Met with pt and family member regarding POA forms, MD reports she is not capable of signing them at this time. Family members wants to speak with MD and gave him the office  number to Contact him. He plans too. Have left a message for Denise-daughter to discuss pt's care and the need to come in and begin learning her care. This worker is concerned the family when visit are listening to The pt talk about how she can walk and do for herself, when actually she is requiring much assist. Also message for her regarding POA and pt's inability to sign forms.  Informed gentleman the hospital only has HCPOA not durable POA forms.

## 2015-01-30 NOTE — Progress Notes (Signed)
Occupational Therapy Session Note  Patient Details  Name: Michelle Holmes MRN: [id number] Date of Birth: 08-09-1950  Today's Date: 01/30/2015 OT Individual Time: [id number] Individual Time Calculation (min): 70 min    Short Term Goals: Week 2:  OT Short Term Goal 1 (Week 2): Pt will complete UB bathing with mod assist OT Short Term Goal 2 (Week 2): Pt will complete UB dressing with mod assist OT Short Term Goal 3 (Week 2): Pt will complete squat pivot transfer to drop arm BSC with mod assist OT Short Term Goal 4 (Week 2): Pt will complete sit > stand at sink in preparation for grooming tasks with mod assist OT Short Term Goal 5 (Week 2): Pt will don pants at bed level with mod assist  Skilled Therapeutic Interventions/Progress Updates:    Treatment session with focus on ADL retraining, sit > stand, standing balance, and improved awareness of and placement of RUE.  Pt in better spirits this AM and motivated to attempt standing with RW to decrease burden of care.  Pt declined bathing, but engaged in changing clothing at sit > stand level from EOB.  Therapist provided cues for hemi-dressing technique however still requiring assistance to thread RUE and pull shirt over head.  Sit > stand at EOB with mod assist and use of RW (pulling up on RW with LUE) pt able to release LUE from RW and assist with pulling pants over hips.  Engaged in multiple sit > stands with RW, unable to transition to pushing up from bed at this time -still reliant on pulling up with LUE.  Attempted stand pivot to w/c with RW as pt wanting to attempt, however unable to advance RLE to pivot therefore requiring +2 assist to position safely into w/c.  Pt setup for breakfast with therapist opening containers, requiring cues to eat more.  Pt motivated to attempt standing again, wanting to show off to primary PT.  Attached hand splint to RW to improve positioning of RUE when standing.  Pt left seated upright in w/c with quick release belt and  half lap tray, with pt in good spirits regarding progress.  Therapy Documentation Precautions:  Precautions Precautions: Fall Precaution Comments: R hemiparesis Restrictions Weight Bearing Restrictions: No General:   Vital Signs: Therapy Vitals Temp: 98.2 F (36.8 C) Temp Source: Oral Pulse Rate: 71 Resp: 18 BP: 127/74 mmHg Patient Position (if appropriate): Lying Oxygen Therapy SpO2: 98 % O2 Device: Not Delivered Pain:  Pt with no c/o pain  See Function Navigator for Current Functional Status.   Therapy/Group: Individual Therapy  [name] 01/30/2015, 8:58 AM

## 2015-01-30 NOTE — Progress Notes (Addendum)
[facility] PHYSICAL MEDICINE & REHABILITATION     PROGRESS NOTE    Subjective/Complaints:    Patient stood with OT today without physical assistance. She still requires mod assist for transfers. Increasing lower extremity strength. Discussed with OT.  ROS: limited by mental status   Objective: Vital Signs: Blood pressure 127/74, pulse 71, temperature 98.2 F (36.8 C), temperature source Oral, resp. rate 18, weight 101.1 kg (222 lb 14.2 oz), SpO2 98 %. No results found. No results for input(s): WBC, HGB, HCT, PLT in the last 72 hours. No results for input(s): NA, K, CL, GLUCOSE, BUN, CREATININE, CALCIUM in the last 72 hours.  Invalid input(s): CO CBG (last 3)   Recent Labs  01/29/15 1657 01/29/15 2105 01/30/15 0701  GLUCAP 204* 172* 99    Wt Readings from Last 3 Encounters:  01/30/15 101.1 kg (222 lb 14.2 oz)  01/14/15 103.193 kg (227 lb 8 oz)  01/12/15 102.513 kg (226 lb)    Physical Exam:  BP 127/74 mmHg  Pulse 71  Temp(Src) 98.2 F (36.8 C) (Oral)  Resp 18  Wt 101.1 kg (222 lb 14.2 oz)  SpO2 98% Constitutional: NAD. Vital signs reviewed.  HENT: Normocephalic and atraumatic.  Eyes: Conjunctivae and EOM are normal.  Cardiovascular: Normal rate and regular rhythm.  Respiratory: Effort normal and breath sounds normal. No respiratory distress. She has no wheezes.  GI: Soft. Bowel sounds are normal. There is no tenderness.  Musculoskeletal: She exhibits no edema or tenderness.  Neurological: She is alert, confused , not oriented  Right facial weakness with dysarthric speech Exam limited by poor participation.  Motor: RUE 0/5 LUE 5/5 LLE 4+/5 RLE 4 minus/5  Right facial weakness Skin: Skin is warm and dry. No erythema.  Psychiatric: Her affect is blunt. Her speech is delayed. She is slowed. Cognition and memory are impaired. She expresses inappropriate judgment.  Assessment/Plan: 1. Functional deficits secondary to Left subthalamic lacunar infarct causing  RIght hemiparesis  which require 3+ hours per day of interdisciplinary therapy in a comprehensive inpatient rehab setting. Physiatrist is providing close team supervision and 24 hour management of active medical problems listed below. Physiatrist and rehab team continue to assess barriers to discharge/monitor patient progress toward functional and medical goals.  Function:  Bathing Bathing position Bathing activity did not occur: Refused Position: Wheelchair/chair at sink  Bathing parts Body parts bathed by patient: Chest, Abdomen, Right upper leg, Left upper leg Body parts bathed by helper: Right arm, Left arm, Buttocks, Right lower leg, Left lower leg, Back  Bathing assist Assist Level: 2 helpers      Upper Body Dressing/Undressing Upper body dressing Upper body dressing/undressing activity did not occur: Refused What is the patient wearing?: Pull over shirt/dress     Pull over shirt/dress - Perfomed by patient: Pull shirt over trunk, Thread/unthread left sleeve Pull over shirt/dress - Perfomed by helper: Thread/unthread right sleeve, Put head through opening        Upper body assist Assist Level:  (Mod assist)      Lower Body Dressing/Undressing Lower body dressing   What is the patient wearing?: Pants       Pants- Performed by helper: Thread/unthread right pants leg, Thread/unthread left pants leg, Pull pants up/down   Non-skid slipper socks- Performed by helper: Don/doff right sock, Don/doff left sock                  Lower body assist Assist for lower body dressing:  (Total assist sit > stand)  Toileting Toileting Toileting activity did not occur: N/A Toileting steps completed by patient: Performs perineal hygiene Toileting steps completed by helper: Adjust clothing prior to toileting, Performs perineal hygiene, Adjust clothing after toileting Toileting Assistive Devices: Grab bar or rail  Toileting assist Assist level: Touching or steadying assistance  (Pt.75%)   Transfers Chair/bed transfer   Chair/bed transfer method: Stand pivot Chair/bed transfer assist level: 2 helpers Chair/bed transfer assistive device: Scientist, product/process development: Architectural technologist activity did not occur: Refused   Max distance: 20 Assist level: 2 helpers   Oceanographer activity did not occur: Refused Type: Manual Max wheelchair distance: 5 Assist Level: Maximal assistance (Pt 25 - 49%)  Cognition Comprehension Comprehension assist level: Understands complex 90% of the time/cues 10% of the time, Understands basic less than 25% of the time/ requires cueing >75% of the time  Expression Expression assist level: Expresses basic 75 - 89% of the time/requires cueing 10 - 24% of the time. Needs helper to occlude trach/needs to repeat words.  Social Interaction Social Interaction assist level: Interacts appropriately 50 - 74% of the time - May be physically or verbally inappropriate.  Problem Solving Problem solving assist level: Solves basic 25 - 49% of the time - needs direction more than half the time to initiate, plan or complete simple activities  Memory Memory assist level: Recognizes or recalls less than 25% of the time/requires cueing greater than 75% of the time, Recognizes or recalls 25 - 49% of the time/requires cueing 50 - 75% of the time, Recognizes or recalls 75 - 89% of the time/requires cueing 10 - 24% of the time    Medical Problem List and Plan: 1. Dysphagia, Aphasia,right  hemiparesis secondary to Left subthalamic lacunar infarct  Cont CIR 2. DVT Prophylaxis/Anticoagulation: Pharmaceutical: Lovenox 3. Chronic back pain/ Pain Management:lumbar stenosis and scoliosis , severe, No Pain c/o  On lyrica bid. Off OxyContin and ultram  Will usetylenol prn to prevent sedation.  4. Mood: LCSW to follow for evaluation and support.  5. Neuropsych: This patient is not capable of making decisions on her own behalf.Pt states  she needs to pay bills, will ask SW to coordinate family to help with this                                                                                                                                                                                                                  6. Skin/Wound Care: Maintain adequate nutrition and hydration status. Routine pressure  relief measures.  7. Fluids/Electrolytes/Nutrition: Monitor I/O. Offer supplements if intake poor. Bmet today 8. COPD: Continue IS. On dulera bid.  9. OSA/OHS: Question use of CPAP at bedtime 10. Dyslipidemia: Continue Lipitor and fenofibrate. 11.DM type 2: Monitor BS ac/hs.    increased lantus to 30 U on 12/28, increased to 35U, am CBG at goal, PM CBGs elevated we'll increase a.m. glipizide CBG (last 3)   Recent Labs  01/29/15 1657 01/29/15 2105 01/30/15 0701  GLUCAP 204* 172* 99    12. HTN: Monitor and slowly resume home medications.   127/74 acceptable range this AM 13. CKD stage 4: Baseline creatinine at 2.7 per records review- slightly higher than baseline currently, recheck creat as part of monitoring on lovenox  Continue to Monitor with serial checks. Encourage po intake.   Cr. Improved to 3.01 on 12/31.some improvement IVF 14. Anemia  Hb 9.3 on 12/31- check stool guaic 15. Thrombocytopenia-Mild may be lovenox related, repeat today  Plts 127 on 12/31-CBC  LOS (Days) 12 A FACE TO FACE EVALUATION WAS PERFORMED  Franchon Ketterman E 01/30/2015 8:40 AM

## 2015-01-30 NOTE — Progress Notes (Signed)
Social Work Patient ID: Michelle Holmes, female   DOB: Mar 13, 1950, 65 y.o.   MRN: [id number] Spoke with Denise-daughter to discuss having her come in to learn pt;s care, she repotts she will.  But her main reason for calling was for [facility] to get the MD number to contact them about her PCS services. She was getting 3-4 hours of care prior to admission to the hospital and they need to talk to him. She also has placed pt on the [facility] waiting list-made aware it is over one year long. Tried to stress that pt will require 24 hr physical care Upon discharge from rehab. When informed neuropsychologist was seeing her Mom tomorrow to assess compentency she was not concerned at all about that. 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 Will see when daughter contacts worker to come in for therapies.

## 2015-01-30 NOTE — Patient Care Conference (Signed)
Inpatient RehabilitationTeam Conference and Plan of Care Update Date: 01/30/2015   Time: 11:15 AM    Patient Name: Michelle Holmes      Medical Record Number: [id number]  Date of Birth: [birth year] Sex: Female         Room/Bed: 4W23C/4W23C-01 Payor Info: Payor: [employer] MEDICARE / Plan: [facility] MEDICARE / Product Type: *No Product type* /    Admitting Diagnosis: L CVA2  Admit Date/Time:  01/18/2015  4:52 PM Admission Comments: No comment available   Primary Diagnosis:  <principal problem not specified> Principal Problem: <principal problem not specified>  Patient Active Problem List   Diagnosis Date Noted  . Absolute anemia   . Thrombocytopenia ([place])   . Acute ischemic VBA thalamic stroke ([place]) 01/18/2015  . AKI (acute kidney injury) ([place])   . Dysarthria   . Lethargy   . DM type 2 with diabetic peripheral neuropathy ([place])   . Labile blood pressure   . Hyperlipidemia   . History of CVA with residual deficit   . Chronic obstructive pulmonary disease ([place])   . Hemiparesis, aphasia, and dysphagia as late effect of cerebrovascular accident (CVA) ([place])   . Acute ischemic stroke ([place])   . CVA (cerebral vascular accident) ([place]) 01/14/2015  . CVA (cerebral infarction) 01/14/2015  . Right hemiplegia ([place])   . Right sided weakness 01/13/2015  . Abdominal pain 06/20/2014  . Altered mental status 02/08/2012  . Sleep apnea 12/08/2010  . Morbid obesity ([place]) 12/08/2010  . CHF (congestive heart failure) ([place]) 12/08/2010  . Chronic renal failure 12/08/2010  . TIA (transient ischemic attack) 12/08/2010  . CAD (coronary artery disease) 12/08/2010  . Cellulitis of pubic region 12/01/2010  . Uncontrolled type 2 DM with hyperosmolar nonketotic hyperglycemia ([place]) 12/01/2010    Expected Discharge Date: Expected Discharge Date: 02/08/15  Team Members Present: Physician leading conference: Dr. Alysia Penna Social Worker Present: Ovidio Kin, LCSW Nurse Present: Heather Roberts, RN PT  Present: Kem Parkinson, PT OT Present: [name], OT SLP Present: Windell Moulding, SLP PPS Coordinator present : Daiva Nakayama, RN, CRRN     Current Status/Progress Goal Weekly Team Focus  Medical   poor motivation for cognitive retraininmg, making improving physically  bowel cont,  work on transfers   Bowel/Bladder   ccontinent of bowel and bladder, LBM 12/31  mod I  continue to monitor, treat constipation as indicated   Swallow/Nutrition/ Hydration   Dys 3, thin liquids, full supervision, mastication of regular textures remains impaired   supervision   trials of advanced textures    ADL's   pt continues to decline bathing and dressing intermittently.  Mod assist UB dressing, total assist LB dressing, mod assist sit > stand with UE support on RW, mod assist +2 squat pivot transfers.   currently min assist overall, however if pt continues to be limited in participation will have to downgrade  participation in treatment sessions, ADL retraining, transfers, sit > stand, RUE NMR   Mobility   modA bed mobility, modA +2 transfers, +2 w/c follow gait  min assist overall  endurance, RLE NMR, transfers, gait training   Communication   Mild-moderate dysarthria, carryover of speech intelligibility limited by cognition and motivation to participate in therapies    min assist   continue to address speech intelligibility at the sentence level    Safety/Cognition/ Behavioral Observations  max encouragement needed for participation, decreased sustained attention to tasks, decreased awareness of deficits, needs mod-max assist during basic tasks   min assist  continue to address participation in therapies and basic cognition during familiar tasks   Pain   patient reports no pain  pain less than or equal to 4 on a scale of 0-10  asssess pain q4h   Skin   skin is dry and flaky, no breakdown noted  no new skin injury/breakdown  assess q shift, apply eucerin as indicated      *See Care Plan and  progress notes for long and short-term goals.  Barriers to Discharge: cognitive deficits, limited motivation    Possible Resolutions to Barriers:  will need caregiver training    Discharge Planning/Teaching Needs:  Will need daughter to come in and see Mom in therapies to make sure she can proivide the care she will require at home.      Team Discussion:  Slowly progressing, at times not motivated to participate in therapies. Question baseline mental function-try to obtain from daughter. Does have increased lower extremity strength. Still requiring mod/total assist. Push po fluids. Need to have daughter come in and see if she can provide the care pt will require at home.  Revisions to Treatment Plan:  Discharge disposition if home versus NHP   Continued Need for Acute Rehabilitation Level of Care: The patient requires daily medical management by a physician with specialized training in physical medicine and rehabilitation for the following conditions: Daily direction of a multidisciplinary physical rehabilitation program to ensure safe treatment while eliciting the highest outcome that is of practical value to the patient.: Yes Daily medical management of patient stability for increased activity during participation in an intensive rehabilitation regime.: Yes Daily analysis of laboratory values and/or radiology reports with any subsequent need for medication adjustment of medical intervention for : Neurological problems;Mood/behavior problems;Diabetes problems  Michelle Holmes, [name] 01/30/2015, 12:42 PM

## 2015-01-30 NOTE — Progress Notes (Signed)
Patient has not had BM since 01/26/15, refusing 61mL Sorbitol, Dulcolax suppository, and Fleets enema. Explained to patient risks of not having regular bowel movements. However, still refusing PRN medications. Continue with plan of care.

## 2015-01-30 NOTE — Progress Notes (Signed)
Physical Therapy Session Note  Patient Details  Name: Michelle Holmes MRN: [id number] Date of Birth: [birth year]  Today's Date: 01/30/2015 PT Individual Time: 1430-1530 PT Individual Time Calculation (min): 60 min   Short Term Goals: Week 2:  PT Short Term Goal 1 (Week 2): Pt will perform supine <>sit with S using hospital bed functions PT Short Term Goal 2 (Week 2): Pt will perform squat pivot transfer consistent modA to R/L side PT Short Term Goal 3 (Week 2): Pt will perform gait with LRAD x30' modA PT Short Term Goal 4 (Week 2): Pt will propel w/c with BUE 50' modA   Skilled Therapeutic Interventions/Progress Updates: pt refused to leave room, but participated in tx in her room.  Sit>< stand repeatedly for standing pre-gait activities of wt shifting L><R, limited calf raises, R le hip flex/ext. nmr via multi -modal cues for R knee stance stability, core activation with towel roll between knees for terminal hip extension in standing x 10; PROM R hamstrings and heel cord in sitting Pt initially refused gait, but later agreed to gait in room; with RW x 8', x 3' x 12' with max assist for advancement of RLe, wider BOS, w/c follow. Pt advances RLe with hip ER and adduction, resulting in narrow BOS; pt resistant to assistance positioning R foot. Quick release belt donned and all needs left within reach.    Therapy Documentation Precautions:  Precautions Precautions: Fall Precaution Comments: R hemiparesis; poor insight into need for tx Restrictions Weight Bearing Restrictions: No   Pain: Pain Assessment Pain Assessment: No/denies pain      See Function Navigator for Current Functional Status.   Therapy/Group: Individual Therapy  Jhamal Plucinski 01/30/2015, 3:34 PM

## 2015-01-31 ENCOUNTER — Inpatient Hospital Stay (HOSPITAL_COMMUNITY): Payer: Self-pay | Admitting: Physical Therapy

## 2015-01-31 ENCOUNTER — Inpatient Hospital Stay (HOSPITAL_COMMUNITY): Payer: Medicare Other | Admitting: Speech Pathology

## 2015-01-31 ENCOUNTER — Encounter (HOSPITAL_COMMUNITY): Payer: Self-pay

## 2015-01-31 ENCOUNTER — Inpatient Hospital Stay (HOSPITAL_COMMUNITY): Payer: Self-pay | Admitting: Occupational Therapy

## 2015-01-31 DIAGNOSIS — R4182 Altered mental status, unspecified: Secondary | ICD-10-CM

## 2015-01-31 DIAGNOSIS — N185 Chronic kidney disease, stage 5: Secondary | ICD-10-CM

## 2015-01-31 DIAGNOSIS — I69319 Unspecified symptoms and signs involving cognitive functions following cerebral infarction: Secondary | ICD-10-CM

## 2015-01-31 LAB — GLUCOSE, CAPILLARY
Glucose-Capillary: 137 mg/dL — ABNORMAL HIGH (ref 65–99)
Glucose-Capillary: 78 mg/dL (ref 65–99)
Glucose-Capillary: 158 mg/dL — ABNORMAL HIGH (ref 65–99)
Glucose-Capillary: 169 mg/dL — ABNORMAL HIGH (ref 65–99)

## 2015-01-31 MED ORDER — BOOST / RESOURCE BREEZE PO LIQD
1.0000 | Freq: Two times a day (BID) | ORAL | Status: DC
  Administered 2015-02-01 – 2015-02-05 (×5): 1 via ORAL

## 2015-01-31 NOTE — Progress Notes (Signed)
Speech Language Pathology Daily Session Note  Patient Details  Name: Michelle Holmes MRN: [id number] Date of Birth: November 25, 1950  Today's Date: 01/31/2015 SLP Individual Time: 1100-1155 SLP Individual Time Calculation (min): 55 min  Short Term Goals: Week 2: SLP Short Term Goal 1 (Week 2): Pt will consume dys 3 textures and thin liquids with supervision cues to monitor and correct oral residue and rate of self feeding over 3 targeted sessions.  SLP Short Term Goal 2 (Week 2): Pt will sustain her attention to basic familiar tasks for 3-5 minutes with min verbal cues for redirection.  SLP Short Term Goal 3 (Week 2): Pt will be intelligible at the phrase level in 75% of opportunities with max assist verbal cues.   SLP Short Term Goal 4 (Week 2): Pt will consume trials of regular textures with min verbal cues for use of swallowing precautions over 2 targeted sessions prior to advancement.   Skilled Therapeutic Interventions:  Pt was seen for skilled ST targeting cognitive goals.  Pt required max encouragement to participate in ST.  Pt became verbally agitated with SLP's attempts to engage her in therapeutic tasks and refused to leave room.  Pt was agreeable to participating in a functional task utilizing a laptop and was able to sustain her attention to internet shopping tasks for 3-5 minutes with min verbal cues for redirection.  She required max to total assist verbal cues for sequencing and organization when navigating between websites to locate targeted information.  She often refused to complete certain aspects of task (donning reading glasses to be able to read from the screen, opening browser windows) and instead demanded that SLP do them for her, despite encouragement from SLP and explanation of rationale behind ST services.  Pt was left in wheelchair with quick release belt donned and call bell left within reach.  Continue per current plan of care.    Function:  Eating Eating                  Cognition Comprehension Comprehension assist level: Follows basic conversation/direction with extra time/assistive device  Expression   Expression assist level: Expresses basic 50 - 74% of the time/requires cueing 25 - 49% of the time. Needs to repeat parts of sentences.  Social Interaction Social Interaction assist level: Interacts appropriately 50 - 74% of the time - May be physically or verbally inappropriate.  Problem Solving Problem solving assist level: Solves basic 25 - 49% of the time - needs direction more than half the time to initiate, plan or complete simple activities  Memory Memory assist level: Recognizes or recalls 25 - 49% of the time/requires cueing 50 - 75% of the time    Pain Pain Assessment Pain Assessment: No/denies pain  Therapy/Group: Individual Therapy  Jacquita Mulhearn, Selinda Orion 01/31/2015, 1:44 PM

## 2015-01-31 NOTE — Progress Notes (Addendum)
[facility] PHYSICAL MEDICINE & REHABILITATION     PROGRESS NOTE    Subjective/Complaints:  Per RN received supp but had vomiting afterward, BM x 2 last noc Denies abd pain  ROS: limited by mental status   Objective: Vital Signs: Blood pressure 132/65, pulse 71, temperature 98.2 F (36.8 C), temperature source Oral, resp. rate 18, weight 100.1 kg (220 lb 10.9 oz), SpO2 98 %. No results found.  Recent Labs  01/30/15 0802  WBC 4.6  HGB 9.6*  HCT 29.4*  PLT 149*    Recent Labs  01/30/15 0802  NA 147*  K 4.3  CL 115*  GLUCOSE 94  BUN 40*  CREATININE 2.90*  CALCIUM 9.7   CBG (last 3)   Recent Labs  01/30/15 1634 01/30/15 2038 01/31/15 0635  GLUCAP 150* 176* 137*    Wt Readings from Last 3 Encounters:  01/31/15 100.1 kg (220 lb 10.9 oz)  01/14/15 103.193 kg (227 lb 8 oz)  01/12/15 102.513 kg (226 lb)    Physical Exam:  BP 132/65 mmHg  Pulse 71  Temp(Src) 98.2 F (36.8 C) (Oral)  Resp 18  Wt 100.1 kg (220 lb 10.9 oz)  SpO2 98% Constitutional: NAD. Vital signs reviewed.  HENT: Normocephalic and atraumatic.  Eyes: Conjunctivae and EOM are normal.  Cardiovascular: Normal rate and regular rhythm.  Respiratory: Effort normal and breath sounds normal. No respiratory distress. She has no wheezes.  GI: Soft. Bowel sounds are normal. There is no tenderness.  Musculoskeletal: She exhibits no edema or tenderness.  Neurological: She is alert, confused , not oriented  Right facial weakness with dysarthric speech Exam limited by poor participation.  Motor: RUE 0/5 LUE 5/5 LLE 4+/5 RLE 4 minus/5  Right facial weakness Skin: Skin is warm and dry. No erythema.  Psychiatric: Her affect is blunt. Her speech is delayed. She is slowed. Cognition and memory are impaired. She expresses inappropriate judgment. Oriented to person, place , not time Immediate Recall 2/3, Recall 1/3 objects after 55min delay Assessment/Plan: 1. Functional deficits secondary to Left  subthalamic lacunar infarct causing RIght hemiparesis  which require 3+ hours per day of interdisciplinary therapy in a comprehensive inpatient rehab setting. Physiatrist is providing close team supervision and 24 hour management of active medical problems listed below. Physiatrist and rehab team continue to assess barriers to discharge/monitor patient progress toward functional and medical goals.  Function:  Bathing Bathing position Bathing activity did not occur: Refused Position: Wheelchair/chair at sink  Bathing parts Body parts bathed by patient: Chest, Abdomen, Right upper leg, Left upper leg Body parts bathed by helper: Right arm, Left arm, Buttocks, Right lower leg, Left lower leg, Back  Bathing assist Assist Level: 2 helpers      Upper Body Dressing/Undressing Upper body dressing Upper body dressing/undressing activity did not occur: Refused What is the patient wearing?: Pull over shirt/dress     Pull over shirt/dress - Perfomed by patient: Pull shirt over trunk, Thread/unthread left sleeve Pull over shirt/dress - Perfomed by helper: Thread/unthread right sleeve, Put head through opening        Upper body assist Assist Level:  (Mod assist)      Lower Body Dressing/Undressing Lower body dressing   What is the patient wearing?: Pants       Pants- Performed by helper: Thread/unthread right pants leg, Thread/unthread left pants leg, Pull pants up/down   Non-skid slipper socks- Performed by helper: Don/doff right sock, Don/doff left sock  Lower body assist Assist for lower body dressing:  (Total assist sit > stand)      Toileting Toileting Toileting activity did not occur: N/A Toileting steps completed by patient: Performs perineal hygiene Toileting steps completed by helper: Adjust clothing prior to toileting, Performs perineal hygiene, Adjust clothing after toileting Toileting Assistive Devices: Grab bar or rail  Toileting assist Assist level:  Set up/obtain supplies, Touching or steadying assistance (Pt.75%), More than reasonable time   Transfers Chair/bed transfer   Chair/bed transfer method: Stand pivot Chair/bed transfer assist level: 2 helpers Chair/bed transfer assistive device: Environmental manager lift: Architectural technologist activity did not occur: Refused   Max distance: 12 Assist level: 2 helpers   Oceanographer activity did not occur: Refused Type: Manual Max wheelchair distance: 5 Assist Level: Maximal assistance (Pt 25 - 49%)  Cognition Comprehension Comprehension assist level: Follows basic conversation/direction with extra time/assistive device  Expression Expression assist level: Expresses basic 50 - 74% of the time/requires cueing 25 - 49% of the time. Needs to repeat parts of sentences.  Social Interaction Social Interaction assist level: Interacts appropriately 50 - 74% of the time - May be physically or verbally inappropriate.  Problem Solving Problem solving assist level: Solves basic 25 - 49% of the time - needs direction more than half the time to initiate, plan or complete simple activities  Memory Memory assist level: Recognizes or recalls 25 - 49% of the time/requires cueing 50 - 75% of the time    Medical Problem List and Plan: 1. Dysphagia, Aphasia,right  hemiparesis secondary to Left subthalamic lacunar infarct  Cont CIR 2. DVT Prophylaxis/Anticoagulation: Pharmaceutical: Lovenox 3. Chronic back pain/ Pain Management:lumbar stenosis and scoliosis , severe, No Pain c/o  On lyrica bid. Off OxyContin and ultram  Will usetylenol prn to prevent sedation.  4. Mood: LCSW to follow for evaluation and support.  5. Neuropsych: This patient is not capable of making decisions on her own behalf.Pt states she needs to pay bills, will ask SW to coordinate family to help with this                                                                                                                                                                                                                   6. Skin/Wound Care: Maintain adequate nutrition and hydration status. Routine pressure relief measures.  7. Fluids/Electrolytes/Nutrition: Monitor I/O. Offer supplements if intake poor.  8. COPD: Continue IS. On dulera bid.  9. OSA/OHS: Question use  of CPAP at bedtime 10. Dyslipidemia: Continue Lipitor and fenofibrate. 11.DM type 2: Monitor BS ac/hs.    increased lantus to 30 U on 12/28, increased to 35U, am CBG at goal, PM CBGs elevated monitor with increased a.m. Glipizide started 1/5 CBG (last 3)   Recent Labs  01/30/15 1634 01/30/15 2038 01/31/15 0635  GLUCAP 150* 176* 137*    12. HTN: Monitor and slowly resume home medications.   132/65 acceptable range this AM 13. CKD stage 4: Baseline creatinine at 2.7 per records review- slightly higher than baseline currently, recheck creat as part of monitoring on lovenox  Continue to Monitor with serial checks. Encourage po intake.   Cr. Improved to 2.9 on 1/4.close to baseline 14. Anemia  Hb 9.6 on 1/4- awaiting stool guaic 15. Thrombocytopenia-Mild may be lovenox related, repeat today  Plts 149 on 1/4-CBC  LOS (Days) 13 A FACE TO FACE EVALUATION WAS PERFORMED  Loc Feinstein E 01/31/2015 7:52 AM

## 2015-01-31 NOTE — Progress Notes (Signed)
Occupational Therapy Session Note  Patient Details  Name: Michelle Holmes MRN: [id number] Date of Birth: 1950-07-07  Today's Date: 01/31/2015 OT Individual Time: [id number] OT Individual Time Calculation (min): 75 min    Skilled Therapeutic Interventions/Progress Updates:    1:1 Pt participated in self care retraining at shower level. Focus on bed mobility , dynamic sitting balance, safe positioning and attention to right UE, squat pivot transfers,  stand pivot transfers with grab bar, sit to stands, standing balance with and without UE support during clothing management, attention to task , functional problem solving, activity tolerance, active participation in functional tasks, attention to task with mod A at times, following directions to remain safe etc. Pt able to perform squat pivot transfer with mod A with extra time and mod A during turning during a stand pivot when using grab bar in the shower. Pt requires max cues to attempt to use right UE with max a gravity eliminated (required more assistance due to decr attention and attempt to use UE - despite having some active movement in UE). Pt perform sit to stand with min A at the sink using the sink to pull up on. Pt able ot let go of sink to assist with pulling up left side of pants. Pt was willing to leave the room and go to the dayroom (with music as an encourager) - focus on ability to recall directions back to room and ability to propel w/c with max A with left LE/ UE. Left in room with safety belt and right UE lap tray.   Therapy Documentation Precautions:  Precautions Precautions: Fall Precaution Comments: R hemiparesis; poor insight into need for tx Restrictions Weight Bearing Restrictions: No Pain:  no c/o pain in session   See Function Navigator for Current Functional Status.   Therapy/Group: Individual Therapy  Willeen Cass [facility] 01/31/2015, 10:50 AM

## 2015-01-31 NOTE — Progress Notes (Signed)
Nutrition Follow-up  DOCUMENTATION CODES:   Obesity unspecified  INTERVENTION:  Discontinue Nepro Shake.   Provide Boost Breeze po BID, each supplement provides 250 kcal and 9 grams of protein.  Encourage adequate PO intake.   NUTRITION DIAGNOSIS:   Increased nutrient needs related to chronic illness as evidenced by estimated needs; ongoing  GOAL:   Patient will meet greater than or equal to 90% of their needs; met  MONITOR:   PO intake, Supplement acceptance, Diet advancement, Weight trends, Labs, I & O's  REASON FOR ASSESSMENT:   Consult Poor PO  ASSESSMENT:   65 year old right handed female with history of HTN, chronic diastolic CHF, COPD, OAS/OHS--oxygen dependent, CAD, CVA with residual right hemiparesis and dysarthria who was admitted on 01/13/15 with worsening of right sided weakness and elevated BS- 504. MRI brain done revealing acute infarct left thalamus and left cerebral peduncle adjacent to limb of left internal capsul and tiny acute ischemic infarct right basal ganglia.   Meal completion has been 75-100%. Pt reports having a good appetite. Pt currently has Nepro Shake and [employer] ordered. She reports she prefers [employer] over the [employer]. RD to modify orders.   Diet Order:  DIET DYS 3 Room service appropriate?: Yes; Fluid consistency:: Thin  Skin:  Reviewed, no issues  Last BM:  1/5  Height:   Ht Readings from Last 1 Encounters:  01/14/15 _0  (1.651 m)    Weight:   Wt Readings from Last 1 Encounters:  01/31/15 220 lb 10.9 oz (100.1 kg)    Ideal Body Weight:  56.8 kg  BMI:  Body mass index is 36.72 kg/(m^2).  Estimated Nutritional Needs:   Kcal:  1850-2050  Protein:  90-100 grams  Fluid:  1.8 - 2 L/day  EDUCATION NEEDS:   No education needs identified at this time  Corrin Parker, MS, RD, LDN Pager # [phone] After hours/ weekend pager # [phone]

## 2015-01-31 NOTE — Progress Notes (Signed)
Physical Therapy Session Note  Patient Details  Name: Michelle Holmes MRN: [id number] Date of Birth: February 06, 1950  Today's Date: 01/31/2015 PT Individual Time: 1415-1530 PT Individual Time Calculation (min): 75 min   Short Term Goals: Week 2:  PT Short Term Goal 1 (Week 2): Pt will perform supine <>sit with S using hospital bed functions PT Short Term Goal 2 (Week 2): Pt will perform squat pivot transfer consistent modA to R/L side PT Short Term Goal 3 (Week 2): Pt will perform gait with LRAD x30' modA PT Short Term Goal 4 (Week 2): Pt will propel w/c with BUE 50' modA  Skilled Therapeutic Interventions/Progress Updates:    Pt received seated in w/c, no c/o pain and agreeable to treatment. Three friends from church present throughout session; friends encouraged pt to participate and allowed breaks for pt to socialize while resting to improve compliance and participation. Gait x4 trials (12', 10', 5', and 20'). Initial trial with RW however with poor progression of RLE, remainder of trials performed with rail to allow therapist to facilitate and assist with progressing RLE. Note improving stance control RLE. Sit <>stand and standing tolerance with BUE on RW with hand splint x4' each, 3 trials while pt talking with friends. Stairs x2 trials on 3" steps,  First trial x1 stair, second trial x2 stairs with forward ascent, backwards descent. Second trial with pt voluntarily leading with RLE; allowed for strengthening purposes. Pt returned to room and requests to use restroom. Stand pivot to/from [facility] with modA +2. Performed perineal hygiene with S and donning/doffing pants with maxA due to poor standing balance. Remained seated in w/c with all needs in reach at completion of session.   Therapy Documentation Precautions:  Precautions Precautions: Fall Precaution Comments: R hemiparesis; poor insight into need for tx Restrictions Weight Bearing Restrictions: No Pain: Pain Assessment Pain Assessment:  No/denies pain Pain Score: 0-No pain   See Function Navigator for Current Functional Status.   Therapy/Group: Individual Therapy  Luberta Mutter 01/31/2015, 3:45 PM

## 2015-01-31 NOTE — Consult Note (Signed)
NEUROCOGNITIVE [name]   [name] is a 65 year old, right-handed woman, who was seen for a neurocognitive status examination to evaluate her emotional and cognitive functioning in the setting of stroke.  According to her medical record, she was admitted on 01/13/15 with worsening of right-sided weakness. MRI of the brain revealed an acute infarct in the left thalamus and left cerebral peduncle adjacent to limb of left internal capsule and tiny acute ischemic infarct in the right basal ganglia.  She had worsening symptoms on 01/15/15, but refused follow-up MRI.  She has been noted to have worsening dysarthria and right-sided weakness as well as dysphagia and fluctuating mental status.  According to staff members, [name] family requested that she fill out power of attorney paperwork, but it was unclear whether she had the cognitive capacity to fill out such paperwork at this time and therefore, a neuropsychological consult was requested.    Emotional Functioning:  During the clinical interview, Ms. [name] reported her mood as "pretty good," though she acknowledged irritability with certain therapy sessions, including the current neuropsychological consult.  She said that especially in the early mornings she does not like to have people coming into her room for treatments.  Despite this, she reported that in general she is coping well; she mostly uses her faith to help her cope.  In fact, she noted that she wished she could go home and let God heal her rather than the staff members here who "weren't there for the last one [stroke]."  [name] denied major worries, concerns, and symptoms of depression.    Mental Status:  [name] total score on a very brief measure of mental status was suggestive of the presence of marked cognitive disruption, at the level of dementia (MMSE-2 brief = 7/16).  She was fully disoriented to date, but was  oriented to location and she freely recalled 2 of 3 previously studied words after a brief delay.  On a measure assessing her ability to make sound judgments in everyday situations, her total score was impaired.  She often refused to provide a solution when prompted; she mostly wanted to "leave it alone" or "stay out of it."  However, during the clinical interview, she was able to tell the examiner why she was in the hospital, how long she had been on this unit, and about her discharge plan (the information she told the examiner was verified as correct by her social worker).    Impressions and Recommendations:  [name] total score on a very brief measure of mental status was suggestive of marked cognitive disruption, at the level of dementia.  In addition, there was evidence of poor ability to make financial and medical decisions.  She could, however, inform the examiner about where she is, why she is here, and other various details about her current medical situation.  Therefore, while it is unlikely that [name] is competent to make complex decisions regarding her medical or financial care, she IS likely capable of appropriately designating someone else to do this for her (i.e. power of attorney).  From an emotional standpoint, she seems to be coping well; there was no evidence of clinically significant depression or anxiety.  Staff members should be aware that her irritability with treatments at times may arise from the fact that she would prefer to heal naturally at home and is frustrated with the hospital experience.  Still, at this time, she said that she is  willing to continue treatments.  A follow-up neuropsychological consultation could be requested should the treatment team feel that it would be beneficial in informing care.    DIAGNOSIS:   CVA  Marlane Hatcher, Psy.D.  Clinical Neuropsychologist

## 2015-02-01 ENCOUNTER — Inpatient Hospital Stay (HOSPITAL_COMMUNITY): Payer: Medicare Other

## 2015-02-01 ENCOUNTER — Inpatient Hospital Stay (HOSPITAL_COMMUNITY): Payer: Medicare Other | Admitting: Physical Therapy

## 2015-02-01 ENCOUNTER — Inpatient Hospital Stay (HOSPITAL_COMMUNITY): Payer: Medicare Other | Admitting: Speech Pathology

## 2015-02-01 ENCOUNTER — Inpatient Hospital Stay (HOSPITAL_COMMUNITY): Payer: Self-pay | Admitting: Occupational Therapy

## 2015-02-01 LAB — CREATININE, SERUM
Creatinine, Ser: 3.19 mg/dL — ABNORMAL HIGH (ref 0.44–1.00)
GFR calc Af Amer: 17 mL/min — ABNORMAL LOW (ref >60)
GFR calc non Af Amer: 14 mL/min — ABNORMAL LOW (ref >60)

## 2015-02-01 LAB — GLUCOSE, CAPILLARY
Glucose-Capillary: 123 mg/dL — ABNORMAL HIGH (ref 65–99)
Glucose-Capillary: 95 mg/dL (ref 65–99)
Glucose-Capillary: 182 mg/dL — ABNORMAL HIGH (ref 65–99)
Glucose-Capillary: 186 mg/dL — ABNORMAL HIGH (ref 65–99)

## 2015-02-01 NOTE — Progress Notes (Signed)
Occupational Therapy Daily and Weekly Progress Note  Patient Details  Name: Michelle Holmes MRN: [id number] Date of Birth: 01-13-1951  Beginning of progress report period: January 25, 2016 End of progress report period: February 01, 2015  Today's Date: 02/01/2015 OT Individual Time: 2637-8588 OT Individual Time Calculation (min): 60 min   1:1 self care retraining with focus on toilet transfer, toileting, LB dressing, grooming at sink and basic transfer training from w/c<>regular armed chair. Pt declined bathing today or brushing teeth.  Pt able to demonstrate mod A transfer with grab bar w/c<>toilet, sit to stand with min A with extra time, standing balance without UE support min A during clothing manipulation.  Pt continues to scratch her dry skin (but declined lotion) and she how has broken her skin in multiple areas along her lower back and back side. RN aware. Bandage placed on skin. Pt still requires increased A with LB dressing and pt prefers to have help and easily becomes frustrated with attempting to perform tasks. Grooming performed at sink with focus on incorporating right hand with max A. (finger nails clean from blood and dirt.  Perform basic squat pivot transfer with min A with extra time from w/c<> armed chair. Pt left in w/c with quick release belt on.    Patient has met 3 of 4 short term goals with continuing to progress towards one goal and other discontinued due to upgraded goal (from bed level to sit to stand). Pt continues to be self limiting due to decr cognition which impacts her progress in ADL performance.  Pt can currently transfer squat pivot with mod A, bath with mod A , dress UB with min A, dress LB with max A and perform toileting with max A.  Pt reports she will be d/c home with family's assistance- however family has not been present or performed hands on assistance with ADL tasks. Pt's right UE currently presents as a Brunstrom II in shoulder and I in hand.  Patient  continues to demonstrate the following deficits: muscle weakness, decreased cardiorespiratoy endurance, impaired timing and sequencing, abnormal tone, unbalanced muscle activation, decreased coordination and decreased motor planning, deconditioned, decreased motor planning, decreased initiation, decreased attention, decreased awareness, decreased problem solving, decreased safety awareness, decreased memory and delayed processing and decreased sitting balance, decreased standing balance, decreased postural control, hemiplegia, decreased balance strategies and difficulty maintaining precautions and therefore will continue to benefit from skilled OT intervention to enhance overall performance with BADL and iADL.  Patient progressing toward long term goals. long term goals downgraded related to cognition and LB dressing to mod A based on her progress.   Continue plan of care.  OT Short Term Goals Week 2:  OT Short Term Goal 1 (Week 2): Pt will complete UB bathing with mod assist OT Short Term Goal 1 - Progress (Week 2): Met OT Short Term Goal 2 (Week 2): Pt will complete UB dressing with mod assist OT Short Term Goal 2 - Progress (Week 2): Met (just a shirt (no bra)) OT Short Term Goal 3 (Week 2): Pt will complete squat pivot transfer to drop arm BSC with mod assist OT Short Term Goal 3 - Progress (Week 2): Progressing toward goal OT Short Term Goal 4 (Week 2): Pt will complete sit > stand at sink in preparation for grooming tasks with mod assist OT Short Term Goal 4 - Progress (Week 2): Met OT Short Term Goal 5 (Week 2): Pt will don pants at bed level with mod assist  OT Short Term Goal 5 - Progress (Week 2): Discontinued (comment) (working on performing from chair sit to stand) Week 3:  OT Short Term Goal 1 (Week 3): Pt will perform squat pivot transfers consistently with mod A to toilet/ BSC OT Short Term Goal 2 (Week 3): Pt will don LB clothing with mod A sit to stand OT Short Term Goal 3 (Week 3):  Pt will perform 3/3 grooming tasks with min A sitting at the sink OT Short Term Goal 4 (Week 3): Pt will perform bed mobility with min A with extra time without bed rails and flat bed  Skilled Therapeutic Interventions/Progress Updates:      Therapy Documentation Precautions:  Precautions Precautions: Fall Precaution Comments: R hemiparesis; poor insight into need for tx Restrictions Weight Bearing Restrictions: No Pain:  no c/o pain   See Function Navigator for Current Functional Status.   Therapy/Group: Individual Therapy  Willeen Cass [facility] 02/01/2015, 10:57 AM

## 2015-02-01 NOTE — Progress Notes (Signed)
Speech Language Pathology Weekly Progress and Session Note  Patient Details  Name: Michelle Holmes MRN: [id number] Date of Birth: 1950/12/21  Beginning of progress report period: January 25, 2015   End of progress report period: February 01, 2015   Today's Date: 02/01/2015 SLP Individual Time: 0805-0900 SLP Individual Time Calculation (min): 55 min  Short Term Goals: Week 2: SLP Short Term Goal 1 (Week 2): Pt will consume dys 3 textures and thin liquids with supervision cues to monitor and correct oral residue and rate of self feeding over 3 targeted sessions.  SLP Short Term Goal 1 - Progress (Week 2): Met SLP Short Term Goal 2 (Week 2): Pt will sustain her attention to basic familiar tasks for 3-5 minutes with min verbal cues for redirection.  SLP Short Term Goal 2 - Progress (Week 2): Met SLP Short Term Goal 3 (Week 2): Pt will be intelligible at the phrase level in 75% of opportunities with max assist verbal cues.   SLP Short Term Goal 3 - Progress (Week 2): Discontinued (comment) (discontinued at this time to allow therapy sessions to focus on cognition) SLP Short Term Goal 4 (Week 2): Pt will consume trials of regular textures with min verbal cues for use of swallowing precautions over 2 targeted sessions prior to advancement.  SLP Short Term Goal 4 - Progress (Week 2): Progressing toward goal    New Short Term Goals: Week 3: SLP Short Term Goal 1 (Week 3): Pt will consume trials of regular textures with min verbal cues for use of swallowing precautions over 2 targeted sessions prior to advancement.  SLP Short Term Goal 2 (Week 3): Pt will sustain her attention to basic familiar tasks for 7-10 minutes with min verbal cues for redirection.  SLP Short Term Goal 3 (Week 3): Pt will recall basic, daily information for 75% accuracy with mod verbal cues for use of external aids.  SLP Short Term Goal 4 (Week 3): Pt will identify at least 2 deficits occurring s/p CVA with max question cues.     Weekly Progress Updates:  Pt made limited functional gains this reporting period and has met 2 out of 3 targeted goals.  Short term dysarthria goal has been discontinued at this time to allow therapy sessions to address cognition as these deficits appear to be primary limiting factors to progress in therapy and safe discharge back to home environment.  Pt remains on dys 3 textures and thin liquids with full supervision due to cognitive deficits which result in fluctuating ability to consistently comply with swallowing precautions.  Pt also currently requires mod-max assist for basic, familiar tasks due to decreased awareness of deficits, decreased sustained attention to tasks, poor frustration tolerance, and decreased motivation to participate in therapies.  No family has been present for training at this time.  Continue to recommend that pt have 24/7 supervision and ST follow up at discharge to continue to address cognitive, speech, and swallowing function.  As a result, pt would continue to benefit from skilled ST while inpatient in order to maximize functional independence and reduce burden of care prior to discharge.     Intensity: Minumum of 1-2 x/day, 30 to 90 minutes Frequency: 3 to 5 out of 7 days Duration/Length of Stay: 21-28 days  Treatment/Interventions: Cognitive remediation/compensation;Cueing hierarchy;Dysphagia/aspiration precaution training;Environmental controls;Functional tasks;Patient/family education;Internal/external aids   Daily Session  Skilled Therapeutic Interventions:  Pt was seen for skilled ST targeting goals for dysphagia and cognition.  Pt consumed dys 3 textures and  thin liquids with supervision verbal cues following initial tray set up assist.  Pt demonstrated no overt s/s of aspiration and effective clearance of solids from the oral cavity post swallow.  Recommend that pt remain on dys 3 textures and SLP will continue to follow up for trials of advanced textures per  improved mentation.  Pt was able to sustain her attention to basic self care tasks such as completing oral hygiene and donning a clean shirt for ~5 minute intervals with min assist verbal cues for redirection to task, although she required max assist multimodal cues for organization and sequencing to carry out tasks.  Pt was left in wheelchair with quick release belt donned and call bell left within reach.  Goals updated on this date to reflect current progress and plan of care.      Function:   Eating Eating   Modified Consistency Diet: Yes Eating Assist Level: Supervision or verbal cues;Set up assist for   Eating Set Up Assist For: Opening containers       Cognition Comprehension Comprehension assist level: Follows basic conversation/direction with extra time/assistive device  Expression   Expression assist level: Expresses basic 50 - 74% of the time/requires cueing 25 - 49% of the time. Needs to repeat parts of sentences.  Social Interaction Social Interaction assist level: Interacts appropriately 50 - 74% of the time - May be physically or verbally inappropriate.  Problem Solving Problem solving assist level: Solves basic 25 - 49% of the time - needs direction more than half the time to initiate, plan or complete simple activities  Memory Memory assist level: Recognizes or recalls 25 - 49% of the time/requires cueing 50 - 75% of the time   General    Pain Pain Assessment Pain Assessment: No/denies pain Pain Score: 0-No pain  Therapy/Group: Individual Therapy  Charda Janis, Selinda Orion 02/01/2015, 12:15 PM

## 2015-02-01 NOTE — Progress Notes (Signed)
Physical Therapy Weekly Progress Note  Patient Details  Name: Michelle Holmes MRN: [id number] Date of Birth: 1950-08-06  Beginning of progress report period: January 25, 2015 End of progress report period: January 31, 2014  Today's Date: 02/01/2015 PT Individual Time: 1000-1100 PT Individual Time Calculation (min): 60 min   Patient has met 0 of 4 short term goals. However pt making progress towards goals. Pt requires modA for bed mobility from flat bed, maxA and +2 for w/c follow for ambulation with RW, maxA +2 for stairs for strengthening purposes, and maxA for w/c propulsion. Although motivation and participation have improved over the past week, sessions are still limited by pt refusal to perform certain tasks, being unwilling to ambulate longer distances, long rest breaks to improve participation and compliance. Of note, pt has demonstrated best performance when family and friends are visiting. Therapist is focusing on identifying motivating factors for pt, and encouraging family/friends to attend sessions when possible, increasing pt's awareness of deficits to demonstrate need for participation in rehab.  Patient continues to demonstrate the following deficits: impaired activity tolerance, balance, postural control, ability to compensate for deficits, functional use of  right upper extremity and right lower extremity, attention, awareness and coordination and therefore will continue to benefit from skilled PT intervention to enhance overall performance with bed mobility, transfers, ambulation, w/c management, home and community access.  Patient progressing slowly towards long term goals. Goals downgraded to modA overall due to slow progress and decreased pt motivation and participation..  Plan of care revisions: see goal sheet for details.  PT Short Term Goals Week 2:  PT Short Term Goal 1 (Week 2): Pt will perform supine <>sit with S using hospital bed functions PT Short Term Goal 1 - Progress  (Week 2): Not met PT Short Term Goal 2 (Week 2): Pt will perform squat pivot transfer consistent modA to R/L side PT Short Term Goal 2 - Progress (Week 2): Not met PT Short Term Goal 3 (Week 2): Pt will perform gait with LRAD x30' modA PT Short Term Goal 3 - Progress (Week 2): Not met PT Short Term Goal 4 (Week 2): Pt will propel w/c with BUE 50' modA PT Short Term Goal 4 - Progress (Week 2): Not met Week 3:  PT Short Term Goal 1 (Week 3): Pt will perform supine <>sit with S using hospital bed functions PT Short Term Goal 2 (Week 3): Pt will perform squat pivot transfer consistent modA to R/L side PT Short Term Goal 3 (Week 3): Pt will perform gait with LRAD x30' modA PT Short Term Goal 4 (Week 3): Pt will propel w/c with BUE 50' modA  Skilled Therapeutic Interventions/Progress Updates:    Pt received in w/c, no c/o pain and agreeable to treatment with encouragement. Gait with RW and maxA with w/c follow 4x15' with hand splint and ace wrap dorsiflexon assist RLE. Educated pt in purpose of ace wrap and pt agreeable to wear, however when therapist begins to don ace wrap pt gets angry and attempts to stop therapist. Again explained the purpose of ace wrap and pt appears to remember and consents. Requires maxA for RLE progression, however noted improvement with ace wrap compared to without. One major LOB with RLE scissoring on first step and pt impulsively stepping with LE and losing balance. TotalA to maintain balance and return to sitting in w/c. Squat pivot transfer to R in apartment to bed with mod A +2 due to LOB to R side. Sit >supine  min A for RLE into bed. Supine>sit with several attempts and pt refusing help, finally requiring modA to come to sitting position. Squat pivot to L side to return to w/c with modA +1. Remained seated in w/c with rehab tech present to wait for following OT session, no quick release belt donned with pt supervised. All needs in reach at completion of session.   Therapy  Documentation Precautions:  Precautions Precautions: Fall Precaution Comments: R hemiparesis; poor insight into need for tx Restrictions Weight Bearing Restrictions: No Pain: Pain Assessment Pain Assessment: No/denies pain Pain Score: 0-No pain   See Function Navigator for Current Functional Status.  Therapy/Group: Individual Therapy  Luberta Mutter 02/01/2015, 11:55 AM

## 2015-02-01 NOTE — Progress Notes (Signed)
Occupational Therapy Note  Patient Details  Name: Michelle Holmes MRN: [id number] Date of Birth: [birth year]  Today's Date: 02/01/2015 OT Individual Time: 1400-1430 OT Individual Time Calculation (min): 30 min   Pt denied pain Individual Therapy  Pt resting in w/c upon arrival.  Pt engaged in standing tasks using St[place] to assist with standing.  Pt reluctant to stand without mechanical device.  Pt engaged in game of Connect 4 requiring mod verbal cues to scan game board to make appropriate moves.  Pt RUE noted with increased spacticity when attempting functional movement.  Pt remained in w/c with QRB and half lap tray in place.   Michelle Holmes [facility] 02/01/2015, 2:40 PM

## 2015-02-01 NOTE — Progress Notes (Signed)
[place] [facility] MEDICINE & REHABILITATION     PROGRESS NOTE    Subjective/Complaints:  Appreciate Neuropsych note Pt unable to tell me day , date or month Wants to go home prior to Jan 15 for grandson's birthday  ROS: limited by mental status   Objective: Vital Signs: Blood pressure 132/82, pulse 72, temperature 98.8 F (37.1 C), temperature source Oral, resp. rate 16, weight 102.7 kg (226 lb 6.6 oz), SpO2 100 %. No results found.  Recent Labs  01/30/15 0802  WBC 4.6  HGB 9.6*  HCT 29.4*  PLT 149*    Recent Labs  01/30/15 0802 02/01/15 0100  NA 147*  --   K 4.3  --   CL 115*  --   GLUCOSE 94  --   BUN 40*  --   CREATININE 2.90* 3.19*  CALCIUM 9.7  --    CBG (last 3)   Recent Labs  01/31/15 1646 01/31/15 2109 02/01/15 0643  GLUCAP 158* 169* 123*    Wt Readings from Last 3 Encounters:  02/01/15 102.7 kg (226 lb 6.6 oz)  01/14/15 103.193 kg (227 lb 8 oz)  01/12/15 102.513 kg (226 lb)    Physical Exam:  BP 132/82 mmHg  Pulse 72  Temp(Src) 98.8 F (37.1 C) (Oral)  Resp 16  Wt 102.7 kg (226 lb 6.6 oz)  SpO2 100% Constitutional: NAD. Vital signs reviewed.  HENT: Normocephalic and atraumatic.  Eyes: Conjunctivae and EOM are normal.  Cardiovascular: Normal rate and regular rhythm.  Respiratory: Effort normal and breath sounds normal. No respiratory distress. She has no wheezes.  GI: Soft. Bowel sounds are normal. There is no tenderness.  Musculoskeletal: She exhibits no edema or tenderness.  Neurological: She is alert, confused , not oriented  Right facial weakness with dysarthric speech Exam limited by poor participation.  Motor: RUE 0/5 LUE 5/5 LLE 4+/5 RLE 4 minus/5  Right facial weakness Skin: Skin is warm and dry. No erythema.  Psychiatric: Her affect is blunt. Her speech is delayed. She is slowed. Cognition and memory are impaired. She expresses inappropriate judgment. Oriented to person, place , not time  Assessment/Plan: 1.  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 Physiatrist is providing close team supervision and 24 hour management of active medical problems listed below. Physiatrist and rehab team continue to assess barriers to discharge/monitor patient progress toward functional and medical goals.  Function:  Bathing Bathing position Bathing activity did not occur: Refused Position: Production manager parts bathed by patient: Right arm, Chest, Abdomen, Right upper leg, Left upper leg Body parts bathed by helper: Left arm, Front perineal area, Buttocks, Right lower leg, Left lower leg, Back  Bathing assist Assist Level: Touching or steadying assistance(Pt > 75%)      Upper Body Dressing/Undressing Upper body dressing Upper body dressing/undressing activity did not occur: Refused What is the patient wearing?: Pull over shirt/dress     Pull over shirt/dress - Perfomed by patient: Thread/unthread left sleeve, Put head through opening, Pull shirt over trunk Pull over shirt/dress - Perfomed by helper: Thread/unthread right sleeve        Upper body assist Assist Level: Touching or steadying assistance(Pt > 75%)      Lower Body Dressing/Undressing Lower body dressing   What is the patient wearing?: Pants, Socks, Shoes       Pants- Performed by helper: Thread/unthread right  pants leg, Thread/unthread left pants leg, Pull pants up/down   Non-skid slipper socks- Performed by helper: Don/doff right sock, Don/doff left sock       Shoes - Performed by helper: Don/doff right shoe, Don/doff left shoe          Lower body assist Assist for lower body dressing: Touching or steadying assistance (Pt > 75%)      Toileting Toileting Toileting activity did not occur: N/A Toileting steps completed by patient: Performs perineal hygiene Toileting  steps completed by helper: Adjust clothing prior to toileting, Performs perineal hygiene, Adjust clothing after toileting Toileting Assistive Devices: Grab bar or rail  Toileting assist Assist level: Two helpers   Transfers Chair/bed transfer   Chair/bed transfer method: Stand pivot Chair/bed transfer assist level: 2 helpers Chair/bed transfer assistive device: Scientist, product/process development: Architectural technologist activity did not occur: Refused   Max distance: 20 Assist level: 2 helpers   Oceanographer activity did not occur: Refused Type: Manual Max wheelchair distance: 5 Assist Level: Maximal assistance (Pt 25 - 49%)  Cognition Comprehension Comprehension assist level: Follows basic conversation/direction with extra time/assistive device  Expression Expression assist level: Expresses basic 50 - 74% of the time/requires cueing 25 - 49% of the time. Needs to repeat parts of sentences.  Social Interaction Social Interaction assist level: Interacts appropriately 50 - 74% of the time - May be physically or verbally inappropriate.  Problem Solving Problem solving assist level: Solves basic 25 - 49% of the time - needs direction more than half the time to initiate, plan or complete simple activities  Memory Memory assist level: Recognizes or recalls 25 - 49% of the time/requires cueing 50 - 75% of the time    Medical Problem List and Plan: 1. Dysphagia, Aphasia,right  hemiparesis secondary to Left subthalamic lacunar infarct  Cont CIR 2. DVT Prophylaxis/Anticoagulation: Pharmaceutical: Lovenox-borderline low plt monitor 3. Chronic back pain/ Pain Management:lumbar stenosis and scoliosis , severe, No Pain c/o  On lyrica bid. Off OxyContin and ultram  Will usetylenol prn to prevent sedation.  4. Mood: LCSW to follow for evaluation and support.  5. Neuropsych: This patient is not capable of making decisions on her own behalf.Pt states she needs to pay bills, will  ask SW to coordinate family to help with this  6. Skin/Wound Care: Maintain adequate nutrition and hydration status. Routine pressure relief measures.  7. Fluids/Electrolytes/Nutrition: Monitor I/O. Offer supplements if intake poor.  8. COPD: Continue IS. On dulera bid.  9. OSA/OHS: Question use of CPAP at bedtime 10. Dyslipidemia: Continue Lipitor and fenofibrate. 11.DM type 2: Monitor BS ac/hs.  , CBGs improving on glipizide 5mg  qam and Lantus 35U qhs CBG (last 3)   Recent Labs  01/31/15 1646 01/31/15 2109 02/01/15 0643  GLUCAP 158* 169* 123*    12. HTN: Monitor and slowly resume home medications.   132/82 acceptable range this AM 13. CKD stage 4: Baseline creatinine at 2.7 per records review- slightly higher than baseline currently, recheck creat as part of monitoring on lovenox  Continue to Monitor with serial checks. Encourage po intake.   Cr. Improved to 2.9 on 1/4.close to baseline 14. Anemia  Hb 9.6 on 1/4- awaiting stool guaic 15. Thrombocytopenia-Mild may be lovenox related,   Plts 149 on 1/4-CBC  LOS (Days) 14 A FACE TO FACE EVALUATION WAS PERFORMED  Charlett Blake 02/01/2015 7:46 AM

## 2015-02-01 NOTE — Plan of Care (Signed)
Problem: RH Dressing Goal: LTG Patient will perform lower body dressing w/assist (OT) LTG: Patient will perform lower body dressing with assist, with/without cues in positioning using equipment (OT)  Downgraded JLS  Problem: RH Functional Use of Upper Extremity Goal: LTG Patient will use RT/LT upper extremity as a (OT) LTG: Patient will use right/left upper extremity as a stabilizer/gross assist/diminished/nondominant/dominant level with assist, with/without cues during functional activity (OT)  Downgraded JLS  Problem: RH Awareness Goal: LTG: Patient will demonstrate intellectual/emergent (OT) LTG: Patient will demonstrate intellectual/emergent/anticipatory awareness with assist during a functional activity (OT)  Downgraded JLS

## 2015-02-02 LAB — GLUCOSE, CAPILLARY
Glucose-Capillary: 104 mg/dL — ABNORMAL HIGH (ref 65–99)
Glucose-Capillary: 116 mg/dL — ABNORMAL HIGH (ref 65–99)
Glucose-Capillary: 210 mg/dL — ABNORMAL HIGH (ref 65–99)
Glucose-Capillary: 211 mg/dL — ABNORMAL HIGH (ref 65–99)

## 2015-02-02 NOTE — Progress Notes (Signed)
[place] PHYSICAL MEDICINE & REHABILITATION     PROGRESS NOTE    Subjective/Complaints:  No issues overnite, last BM 1/5 incont  ROS: limited by mental status , denies breathing issues or pain  Objective: Vital Signs: Blood pressure 164/87, pulse 62, temperature 98.2 F (36.8 C), temperature source Oral, resp. rate 16, weight 104 kg (229 lb 4.5 oz), SpO2 100 %. No results found. No results for input(s): WBC, HGB, HCT, PLT in the last 72 hours.  Recent Labs  02/01/15 0100  CREATININE 3.19*   CBG (last 3)   Recent Labs  02/01/15 1624 02/01/15 2149 02/02/15 0646  GLUCAP 182* 186* 104*    Wt Readings from Last 3 Encounters:  02/02/15 104 kg (229 lb 4.5 oz)  01/14/15 103.193 kg (227 lb 8 oz)  01/12/15 102.513 kg (226 lb)    Physical Exam:  BP 164/87 mmHg  Pulse 62  Temp(Src) 98.2 F (36.8 C) (Oral)  Resp 16  Wt 104 kg (229 lb 4.5 oz)  SpO2 100% Constitutional: NAD. Vital signs reviewed.  HENT: Normocephalic and atraumatic.  Eyes: Conjunctivae and EOM are normal.  Cardiovascular: Normal rate and regular rhythm.  Respiratory: Effort normal and breath sounds normal. No respiratory distress. She has no wheezes.  GI: Soft. Bowel sounds are normal. There is no tenderness.  Musculoskeletal: She exhibits no edema or tenderness.  Neurological: She is alert, confused , not oriented  Right facial weakness with dysarthric speech Exam limited by poor participation.  Motor: RUE 0/5 LUE 5/5 LLE 4+/5 RLE 4 minus/5  Right facial weakness Skin: Skin is warm and dry. No erythema.  Psychiatric: Her affect is blunt. Her speech is delayed. She is slowed. Cognition and memory are impaired. She expresses inappropriate judgment. Oriented to person, place , not time  Assessment/Plan: 1. 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 Physiatrist is providing close team supervision and 24 hour management of active medical problems listed below. Physiatrist and rehab team continue to assess barriers to discharge/monitor patient progress toward functional and medical goals.  Function:  Bathing Bathing position Bathing activity did not occur: Refused Position: Production manager parts bathed by patient: Front perineal area (just bathed periarea today) Body parts bathed by helper: Buttocks  Bathing assist Assist Level: Touching or steadying assistance(Pt > 75%)      Upper Body Dressing/Undressing Upper body dressing Upper body dressing/undressing activity did not occur: Refused What is the patient wearing?: Pull over shirt/dress     Pull over shirt/dress - Perfomed by patient: Thread/unthread left sleeve, Put head through opening, Pull shirt over trunk Pull over shirt/dress - Perfomed by helper: Thread/unthread right sleeve        Upper body assist Assist Level: Touching or steadying assistance(Pt > 75%)      Lower Body Dressing/Undressing Lower body dressing   What is the patient wearing?: Pants, Socks, Shoes (brief- total A)       Pants- Performed by helper: Thread/unthread right pants leg, Thread/unthread left pants leg, Pull pants up/down   Non-skid slipper socks- Performed by helper: Don/doff right sock, Don/doff left sock   Socks - Performed by helper: Don/doff right sock, Don/doff left sock   Shoes - Performed by helper: Don/doff right shoe, Don/doff left shoe, Fasten right, Fasten left          Lower body assist Assist for lower body dressing:  Touching or steadying assistance (Pt > 75%)      Toileting Toileting Toileting activity did not occur: N/A Toileting steps completed by patient: Performs perineal hygiene, Adjust clothing prior to toileting Toileting steps completed by helper: Adjust clothing prior to toileting, Performs perineal hygiene, Adjust clothing after  toileting Toileting Assistive Devices: Grab bar or rail  Toileting assist Assist level: Two helpers   Transfers Chair/bed transfer   Chair/bed transfer [place]: Stand pivot Chair/bed transfer assist level: 2 helpers Chair/bed transfer assistive device: Environmental manager lift: Architectural technologist activity did not occur: Refused   Max distance: 20 Assist level: 2 helpers   Oceanographer activity did not occur: Refused Type: Manual Max wheelchair distance: 5 Assist Level: Maximal assistance (Pt 25 - 49%)  Cognition Comprehension Comprehension assist level: Follows basic conversation/direction with extra time/assistive device  Expression Expression assist level: Expresses basic 50 - 74% of the time/requires cueing 25 - 49% of the time. Needs to repeat parts of sentences.  Social Interaction Social Interaction assist level: Interacts appropriately 50 - 74% of the time - May be physically or verbally inappropriate.  Problem Solving Problem solving assist level: Solves basic 25 - 49% of the time - needs direction more than half the time to initiate, plan or complete simple activities  Memory Memory assist level: Recognizes or recalls 25 - 49% of the time/requires cueing 50 - 75% of the time    Medical Problem List and Plan: 1. Dysphagia, Aphasia,right  hemiparesis secondary to Left subthalamic lacunar infarct  Cont CIR 2. DVT Prophylaxis/Anticoagulation: Pharmaceutical: Lovenox-borderline low plt monitor 3. Chronic back pain/ Pain Management:lumbar stenosis and scoliosis , severe, No Pain c/o  On lyrica bid. Off OxyContin and ultram  Will usetylenol prn to prevent sedation.  4. Mood: LCSW to follow for evaluation and support.  5. Neuropsych: This patient is not capable of making decisions on her own behalf.Pt states she needs to pay bills, will ask SW to coordinate family to help with this                                                                                                                                                                                                                   6. Skin/Wound Care: Maintain adequate nutrition and hydration status. Routine pressure relief measures.  7. Fluids/Electrolytes/Nutrition: Monitor I/O. Offer supplements if intake poor.  8. COPD: Continue IS. On dulera bid.  9. OSA/OHS: Question use of CPAP at bedtime 10. Dyslipidemia: Continue Lipitor and  fenofibrate. 11.DM type 2: Monitor BS ac/hs.  , CBGs improving on glipizide 5mg  qam and Lantus 35U qhs CBG (last 3)   Recent Labs  02/01/15 1624 02/01/15 2149 02/02/15 0646  GLUCAP 182* 186* 104*    12. HTN: Monitor and slowly resume home medications.   164/87, elevated as isolated reading ,monitor before med adjustment 13. CKD stage 4: Baseline creatinine at 2.7 per records review- slightly higher than baseline currently, recheck creat as part of monitoring on lovenox  Continue to Monitor with serial checks. Encourage po intake.   Cr. Improved to 2.9 on 1/4.close to baseline 14. Anemia  Hb 9.6 on 1/4- awaiting stool guaic 15. Thrombocytopenia-Mild may be lovenox related,   Plts 149 on 1/4-CBC- recheck today 16.  No BM x 3 days- start colace LOS (Days) 15 A FACE TO FACE EVALUATION WAS PERFORMED  Alysia Penna E 02/02/2015 9:44 AM

## 2015-02-03 ENCOUNTER — Inpatient Hospital Stay (HOSPITAL_COMMUNITY): Payer: Self-pay | Admitting: Physical Therapy

## 2015-02-03 LAB — GLUCOSE, CAPILLARY
Glucose-Capillary: 130 mg/dL — ABNORMAL HIGH (ref 65–99)
Glucose-Capillary: 120 mg/dL — ABNORMAL HIGH (ref 65–99)
Glucose-Capillary: 144 mg/dL — ABNORMAL HIGH (ref 65–99)
Glucose-Capillary: 251 mg/dL — ABNORMAL HIGH (ref 65–99)

## 2015-02-03 MED ORDER — SENNOSIDES-DOCUSATE SODIUM 8.6-50 MG PO TABS
1.0000 | ORAL_TABLET | Freq: Two times a day (BID) | ORAL | Status: DC
  Administered 2015-02-03 – 2015-02-06 (×8): 1 via ORAL
  Filled 2015-02-03 (×8): qty 1

## 2015-02-03 MED ORDER — SORBITOL 70 % SOLN: 45.0000 mL | Freq: Every day | Status: DC | PRN

## 2015-02-03 NOTE — Progress Notes (Signed)
[place] PHYSICAL MEDICINE & REHABILITATION     PROGRESS NOTE    Subjective/Complaints:  No issues overnite, last BM 1/5 incont Wants to got to bank and pay bills  ROS: limited by mental status , denies breathing issues or pain  Objective: Vital Signs: Blood pressure 105/61, pulse 73, temperature 98.2 F (36.8 C), temperature source Oral, resp. rate 18, weight 102.5 kg (225 lb 15.5 oz), SpO2 98 %. No results found. No results for input(s): WBC, HGB, HCT, PLT in the last 72 hours.  Recent Labs  02/01/15 0100  CREATININE 3.19*   CBG (last 3)   Recent Labs  02/02/15 1630 02/02/15 2127 02/03/15 0702  GLUCAP 210* 211* 130*    Wt Readings from Last 3 Encounters:  02/03/15 102.5 kg (225 lb 15.5 oz)  01/14/15 103.193 kg (227 lb 8 oz)  01/12/15 102.513 kg (226 lb)    Physical Exam:  BP 105/61 mmHg  Pulse 73  Temp(Src) 98.2 F (36.8 C) (Oral)  Resp 18  Wt 102.5 kg (225 lb 15.5 oz)  SpO2 98% Constitutional: NAD. Vital signs reviewed.  HENT: Normocephalic and atraumatic.  Eyes: Conjunctivae and EOM are normal.  Cardiovascular: Normal rate and regular rhythm.  Respiratory: Effort normal and breath sounds normal. No respiratory distress. She has no wheezes.  GI: Soft. Bowel sounds are normal. There is no tenderness.  Musculoskeletal: She exhibits no edema or tenderness.  Neurological: She is alert, confused , not oriented  Right facial weakness with dysarthric speech Exam limited by poor participation.  Motor: RUE 0/5 LUE 5/5 LLE 4+/5 RLE 4 minus/5  Right facial weakness Skin: Skin is warm and dry. No erythema.  Psychiatric: Her affect is blunt. Her speech is delayed. She is slowed. Cognition and memory are impaired. She expresses inappropriate judgment. Oriented to person, place , not time  Assessment/Plan: 1. 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 Physiatrist is providing close team supervision and 24 hour management of active medical problems listed below. Physiatrist and rehab team continue to assess barriers to discharge/monitor patient progress toward functional and medical goals.  Function:  Bathing Bathing position Bathing activity did not occur: Refused Position: Production manager parts bathed by patient: Front perineal area (just bathed periarea today) Body parts bathed by helper: Buttocks  Bathing assist Assist Level: Touching or steadying assistance(Pt > 75%)      Upper Body Dressing/Undressing Upper body dressing Upper body dressing/undressing activity did not occur: Refused What is the patient wearing?: Pull over shirt/dress     Pull over shirt/dress - Perfomed by patient: Thread/unthread left sleeve, Put head through opening, Pull shirt over trunk Pull over shirt/dress - Perfomed by helper: Thread/unthread right sleeve        Upper body assist Assist Level: Touching or steadying assistance(Pt > 75%)      Lower Body Dressing/Undressing Lower body dressing   What is the patient wearing?: Pants, Socks, Shoes (brief- total A)       Pants- Performed by helper: Thread/unthread right pants leg, Thread/unthread left pants leg, Pull pants up/down   Non-skid slipper socks- Performed by helper: Don/doff right sock, Don/doff left sock   Socks - Performed by helper: Don/doff right sock, Don/doff left sock   Shoes - Performed by helper: Don/doff right shoe, Don/doff left shoe, Fasten right, Fasten left  Lower body assist Assist for lower body dressing: Touching or steadying assistance (Pt > 75%)      Toileting Toileting Toileting activity did not occur: N/A Toileting steps completed by patient: Performs perineal hygiene, Adjust clothing prior to toileting Toileting steps completed by helper: Adjust clothing prior to toileting, Performs  perineal hygiene, Adjust clothing after toileting Toileting Assistive Devices: Grab bar or rail  Toileting assist Assist level: Two helpers   Transfers Chair/bed transfer   Chair/bed transfer [place]: Stand pivot Chair/bed transfer assist level: 2 helpers Chair/bed transfer assistive device: Environmental manager lift: Architectural technologist activity did not occur: Refused   Max distance: 20 Assist level: 2 helpers   Oceanographer activity did not occur: Refused Type: Manual Max wheelchair distance: 5 Assist Level: Maximal assistance (Pt 25 - 49%)  Cognition Comprehension Comprehension assist level: Follows basic conversation/direction with extra time/assistive device  Expression Expression assist level: Expresses basic 50 - 74% of the time/requires cueing 25 - 49% of the time. Needs to repeat parts of sentences.  Social Interaction Social Interaction assist level: Interacts appropriately 50 - 74% of the time - May be physically or verbally inappropriate.  Problem Solving Problem solving assist level: Solves basic 25 - 49% of the time - needs direction more than half the time to initiate, plan or complete simple activities  Memory Memory assist level: Recognizes or recalls 25 - 49% of the time/requires cueing 50 - 75% of the time    Medical Problem List and Plan: 1. Dysphagia, Aphasia,right  hemiparesis secondary to Left subthalamic lacunar infarct  Cont CIR 2. DVT Prophylaxis/Anticoagulation: Pharmaceutical: Lovenox-borderline low plt monitor 3. Chronic back pain/ Pain Management:lumbar stenosis and scoliosis , severe, No Pain c/o  On lyrica bid. Off OxyContin and ultram  Will usetylenol prn to prevent sedation.  4. Mood: LCSW to follow for evaluation and support.  5. Neuropsych: This patient is not capable of making decisions on her own behalf.Pt states she needs to pay bills, will ask SW to coordinate family to help with this                                                                                                                                                                                                                   6. Skin/Wound Care: Maintain adequate nutrition and hydration status. Routine pressure relief measures.  7. Fluids/Electrolytes/Nutrition: Monitor I/O. Offer supplements if intake poor.  8. COPD: Continue IS. On dulera bid.  9. OSA/OHS: Question use of  CPAP at bedtime 10. Dyslipidemia: Continue Lipitor and fenofibrate. 11.DM type 2: Monitor BS ac/hs.  , CBGs improving on glipizide 5mg  qam and Lantus 35U qhs CBG (last 3)   Recent Labs  02/02/15 1630 02/02/15 2127 02/03/15 0702  GLUCAP 210* 211* 130*    12. HTN: Monitor and slowly resume home medications.   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 Encourage po intake.   Cr. 3.19 on 1/6.close to baseline 14. Anemia  Hb 9.6 on 1/4- awaiting stool guaic but no BM x several days 15. Thrombocytopenia-Mild may be lovenox related,   Plts 149 on 1/4-CBC- CBC pending 16.  No BM x 4 days- Change colace to senna S , sorbitol this am LOS (Days) 16 A FACE TO FACE EVALUATION WAS PERFORMED  Alysia Penna E 02/03/2015 9:34 AM

## 2015-02-04 ENCOUNTER — Inpatient Hospital Stay (HOSPITAL_COMMUNITY): Payer: Medicare Other | Admitting: Occupational Therapy

## 2015-02-04 ENCOUNTER — Inpatient Hospital Stay (HOSPITAL_COMMUNITY): Payer: Medicare Other | Admitting: Physical Therapy

## 2015-02-04 ENCOUNTER — Inpatient Hospital Stay (HOSPITAL_COMMUNITY): Payer: Medicare Other | Admitting: Speech Pathology

## 2015-02-04 LAB — GLUCOSE, CAPILLARY
Glucose-Capillary: 89 mg/dL (ref 65–99)
Glucose-Capillary: 118 mg/dL — ABNORMAL HIGH (ref 65–99)
Glucose-Capillary: 162 mg/dL — ABNORMAL HIGH (ref 65–99)
Glucose-Capillary: 174 mg/dL — ABNORMAL HIGH (ref 65–99)

## 2015-02-04 NOTE — Progress Notes (Signed)
[place] PHYSICAL MEDICINE & REHABILITATION     PROGRESS NOTE    Subjective/Complaints:  No issues overnite, last BM 1/5 incont   ROS: limited by mental status , denies breathing issues or pain  Objective: Vital Signs: Blood pressure 130/80, pulse 70, temperature 98.6 F (37 C), temperature source Oral, resp. rate 17, weight 103.8 kg (228 lb 13.4 oz), SpO2 100 %. No results found. No results for input(s): WBC, HGB, HCT, PLT in the last 72 hours. No results for input(s): NA, K, CL, GLUCOSE, BUN, CREATININE, CALCIUM in the last 72 hours.  Invalid input(s): CO CBG (last 3)   Recent Labs  02/03/15 1637 02/03/15 2116 02/04/15 0703  GLUCAP 144* 251* 89    Wt Readings from Last 3 Encounters:  02/04/15 103.8 kg (228 lb 13.4 oz)  01/14/15 103.193 kg (227 lb 8 oz)  01/12/15 102.513 kg (226 lb)    Physical Exam:  BP 130/80 mmHg  Pulse 70  Temp(Src) 98.6 F (37 C) (Oral)  Resp 17  Wt 103.8 kg (228 lb 13.4 oz)  SpO2 100% Constitutional: NAD. Vital signs reviewed.  HENT: Normocephalic and atraumatic.  Eyes: Conjunctivae and EOM are normal.  Cardiovascular: Normal rate and regular rhythm.  Respiratory: Effort normal and breath sounds normal. No respiratory distress. She has no wheezes.  GI: Soft. Bowel sounds are normal. There is no tenderness.  Musculoskeletal: She exhibits no edema or tenderness.  Neurological: She is alert, confused , not oriented  Right facial weakness with dysarthric speech Exam limited by poor participation.  Motor: RUE 0/5 LUE 5/5 LLE 4+/5 RLE 4 minus/5  Right facial weakness Skin: Skin is warm and dry. No erythema.  Psychiatric: Her affect is blunt. Her speech is delayed. She is slowed. Cognition and memory are impaired. She expresses inappropriate judgment. Oriented to person, place , not time  Assessment/Plan: 1. 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 Physiatrist is providing close team supervision and 24 hour management of active medical problems listed below. Physiatrist and rehab team continue to assess barriers to discharge/monitor patient progress toward functional and medical goals.  Function:  Bathing Bathing position Bathing activity did not occur: Refused Position: Production manager parts bathed by patient: Front perineal area (just bathed periarea today) Body parts bathed by helper: Buttocks  Bathing assist Assist Level: Touching or steadying assistance(Pt > 75%)      Upper Body Dressing/Undressing Upper body dressing Upper body dressing/undressing activity did not occur: Refused What is the patient wearing?: Pull over shirt/dress     Pull over shirt/dress - Perfomed by patient: Thread/unthread left sleeve, Put head through opening, Pull shirt over trunk Pull over shirt/dress - Perfomed by helper: Thread/unthread right sleeve        Upper body assist Assist Level: Touching or steadying assistance(Pt > 75%)      Lower Body Dressing/Undressing Lower body dressing   What is the patient wearing?: Pants, Socks, Shoes (brief- total A)       Pants- Performed by helper: Thread/unthread right pants leg, Thread/unthread left pants leg, Pull pants up/down   Non-skid slipper socks- Performed by helper: Don/doff right sock, Don/doff left sock   Socks - Performed by helper: Don/doff right sock, Don/doff left sock   Shoes - Performed by helper: Don/doff right shoe, Don/doff left shoe, Fasten right, Fasten left  Lower body assist Assist for lower body dressing: Touching or steadying assistance (Pt > 75%)      Toileting Toileting Toileting activity did not occur: N/A Toileting steps completed by patient: Adjust clothing prior to toileting, Adjust clothing after toileting Toileting steps completed by helper: Performs perineal  hygiene Toileting Assistive Devices: Grab bar or rail  Toileting assist Assist level: Touching or steadying assistance (Pt.75%)   Transfers Chair/bed transfer   Chair/bed transfer method: Stand pivot Chair/bed transfer assist level: 2 helpers Chair/bed transfer assistive device: Environmental manager lift: Architectural technologist activity did not occur: Refused   Max distance: 20 Assist level: 2 helpers   Oceanographer activity did not occur: Refused Type: Manual Max wheelchair distance: 5 Assist Level: Maximal assistance (Pt 25 - 49%)  Cognition Comprehension Comprehension assist level: Follows complex conversation/direction with extra time/assistive device  Expression Expression assist level: Expresses basic 50 - 74% of the time/requires cueing 25 - 49% of the time. Needs to repeat parts of sentences.  Social Interaction Social Interaction assist level: Interacts appropriately 50 - 74% of the time - May be physically or verbally inappropriate.  Problem Solving Problem solving assist level: Solves basic 25 - 49% of the time - needs direction more than half the time to initiate, plan or complete simple activities  Memory Memory assist level: Recognizes or recalls 25 - 49% of the time/requires cueing 50 - 75% of the time    Medical Problem List and Plan: 1. Dysphagia, Aphasia,right  hemiparesis secondary to Left subthalamic lacunar infarct, UE >> LE weakness  Cont CIR 2. DVT Prophylaxis/Anticoagulation: Pharmaceutical: Lovenox-borderline low plt monitor 3. Chronic back pain/ Pain Management:lumbar stenosis and scoliosis , severe, No Pain c/o  On lyrica bid. Off OxyContin and ultram  Will usetylenol prn to prevent sedation.  4. Mood: LCSW to follow for evaluation and support.  5. Neuropsych: This patient is not capable of making decisions on her own behalf.Pt states she needs to pay bills, will ask SW to coordinate family to help with this    Has stroke  related cognitive deficits, can direct who is her POA but not manage own funds                                                                                                                                                                                                              6. Skin/Wound Care: Maintain adequate nutrition and hydration status. Routine pressure relief measures.  7. Fluids/Electrolytes/Nutrition: Monitor I/O. Offer supplements if intake poor.  8. COPD: Continue IS. On dulera bid.  9. OSA/OHS: Question use of CPAP at bedtime 10. Dyslipidemia: Continue Lipitor and fenofibrate. 11.DM type 2: Monitor BS ac/hs.  , CBGs improving on glipizide 5mg  qam and Lantus 35U qhs CBG (last 3)   Recent Labs  02/03/15 1637 02/03/15 2116 02/04/15 0703  GLUCAP 144* 251* 89    12. HTN: Monitor and slowly resume home medications.   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 Encourage po intake.   Cr. 3.19 on 1/6.close to baseline 14. Anemia  Hb 9.6 on 1/4- awaiting stool guaic but no BM x several days 15. Thrombocytopenia-Mild may be lovenox related,   Plts 149 on 1/4-CBC- CBC pending 16.  No BM x 5 days- Change colace to senna S , Dulcolax LOS (Days) 17 A FACE TO FACE EVALUATION WAS PERFORMED  Anival Pasha E 02/04/2015 8:25 AM

## 2015-02-04 NOTE — Progress Notes (Signed)
Speech Language Pathology Daily Session Note  Patient Details  Name: Michelle Holmes MRN: [id number] Date of Birth: October 08, 1950  Today's Date: 02/04/2015 SLP Individual Time: [id number] SLP Individual Time Calculation (min): 57 min  Short Term Goals: Week 3: SLP Short Term Goal 1 (Week 3): Pt will consume trials of regular textures with min verbal cues for use of swallowing precautions over 2 targeted sessions prior to advancement.  SLP Short Term Goal 2 (Week 3): Pt will sustain her attention to basic familiar tasks for 7-10 minutes with min verbal cues for redirection.  SLP Short Term Goal 3 (Week 3): Pt will recall basic, daily information for 75% accuracy with mod verbal cues for use of external aids.  SLP Short Term Goal 4 (Week 3): Pt will identify at least 2 deficits occurring s/p CVA with max question cues.    Skilled Therapeutic Interventions:  Pt was seen for skilled ST targeting cognitive goals.  Pt's daughter, [name], was present briefly at the beginning of today's therapy session.  SLP provided education regarding pt's current goals and progress in therapy.  Pt's daughter verified information of baseline level of function (pt needed assistance with medication and financial management, was not driving, had assistance for "getting up and ready" in the morning) and reported that she feels that the pt's memory is worse since this most recent stroke.  Pt initially disagreed with daughter and SLP and required max encouragement to participate in therapies; however, she was eventually agreeable to participating in structured tasks targeting recall of new information.  SLP facilitated the session with a categorical naming task targeting attention to task, problem solving, and memory.  Pt required overall mod assist verbal cues for mental flexibility to name specific category members and mod-max assist verbal cues for recall of previously named items.  She sustained her attention to task in a quiet  environment for ~30 minutes with supervision cues for redirection.  Pt was returned to room and left with call bell within reach.  Quick release belt donned for safety.  Continue per current plan of care.   Function:  Eating Eating     Eating Assist Level: Supervision or verbal cues;Set up assist for   Eating Set Up Assist For: Opening containers       Cognition Comprehension Comprehension assist level: Follows basic conversation/direction with extra time/assistive device  Expression   Expression assist level: Expresses basic 90% of the time/requires cueing < 10% of the time.  Social Interaction Social Interaction assist level: Interacts appropriately 75 - 89% of the time - Needs redirection for appropriate language or to initiate interaction.  Problem Solving Problem solving assist level: Solves basic 50 - 74% of the time/requires cueing 25 - 49% of the time  Memory Memory assist level: Recognizes or recalls 50 - 74% of the time/requires cueing 25 - 49% of the time    Pain Pain Assessment Pain Assessment: No/denies pain Pain Score: 0-No pain  Therapy/Group: Individual Therapy  Kyshon Tolliver, Selinda Orion 02/04/2015, 4:04 PM

## 2015-02-04 NOTE — Progress Notes (Signed)
Physical Therapy Session Note  Patient Details  Name: Michelle Holmes MRN: [id number] Date of Birth: 07-Aug-1950  Today's Date: 02/04/2015 PT Individual Time: 1100-1200 and 1400-1430 PT Individual Time Calculation (min): 60 min and 30 min (total 90 min)   Short Term Goals: Week 3:  PT Short Term Goal 1 (Week 3): Pt will perform supine <>sit with S using hospital bed functions PT Short Term Goal 2 (Week 3): Pt will perform squat pivot transfer consistent modA to R/L side PT Short Term Goal 3 (Week 3): Pt will perform gait with LRAD x30' modA PT Short Term Goal 4 (Week 3): Pt will propel w/c with BUE 50' modA  Skilled Therapeutic Interventions/Progress Updates:    Tx 1: Pt received seated in w/c; no c/o pain and agreeable to treatment. Pt requesting not to go into the gym, "I don't like it in there"; agreeable to work in day room. Gait 3 x 10-12' with L rail and mod/maxA for balance and RLE progression. Second trial with ace wrap dorsiflexion assist to RLE, third trial with RLE PLS AFO. One trial x15' with RW and AFO with modA for standing balance and facilitation of weight shift to L side for RLE progression. Pt requires extended rest breaks after each trial, and education regarding purpose of AFO. Following gait trial with AFO pt reports she feels that she walks much better and would like to use one at home. Alerted Algis Liming, PA to request for RLE AFO. Squat pivot w/c <>bed in ADL apartment with modA and verbal cues for hand positioning and sequencing. Pt verbalizes understanding of rehab, states that after her last stroke she did not need so much assistance so she was frustrated initially when coming to rehab. States she now is seeing progress and understand purpose of what we're doing with her. Pt more agreeable during this session to participate, and when beginning to show frustration pt is easily redirected and encouraged by discussing purpose of activity and goal of returning home. Pt returned to room  and remained seated in w/c with quick release belt intact and all needs within reach.   Tx 2: Pt seated in w/c with no c/o pain and agreeable to treatment. Assisted pt with dialing phone to place dinner order; note word-finding difficulties and increased pt frustration with task. Upon leaving pt's room, pt reports "I'm not doing anything, I did it all earlier". Reminded pt of earlier discussion regarding practice on furniture transfer and pt states she does not want to do that. Encouraged pt to decide on her own which mobility task she would like to work on, however pt declines any activity. Encouraged pt to perform transfer to/from bed again to increase independence, reduce reliance on family and reduce their risk of injury and pt states she does not want to work on that either because she did it once earlier today. States she did enough earlier in the day. Educated pt extensively on improving independence, building strength and balance to prepare for d/c home and pt continues to state she does not want to participate and her family will do everything for her at home. Pt agreeable to propel w/c with L hemi technique x30' with modA due to decreased LLE traction on floor and strength deficits. Increased time to maintain focus and motivation to participate in task. Returned to room totalA and pt remained seated in w/c, quick release belt intact at completion of session, all needs in reach.   Therapy Documentation Precautions:  Precautions Precautions:  Fall Precaution Comments: R hemiparesis; poor insight into need for tx Restrictions Weight Bearing Restrictions: No Pain: Pain Assessment Pain Assessment: No/denies pain Pain Score: 0-No pain   See Function Navigator for Current Functional Status.   Therapy/Group: Individual Therapy  Luberta Mutter 02/04/2015, 12:32 PM

## 2015-02-04 NOTE — Progress Notes (Signed)
Orthopedic Tech Progress Note Patient Details:  Michelle Holmes 08/13/1950 [id number]  Patient ID: Michelle Holmes, female   DOB: [birth year], 65 y.o.   MRN: [id number]   Michelle Holmes 02/04/2015, 1:08 PMCalled [employer] for right AFO.

## 2015-02-04 NOTE — Progress Notes (Signed)
Occupational Therapy Session Note  Patient Details  Name: Michelle Holmes MRN: [id number] Date of Birth: [birth year]  Today's Date: 02/04/2015 OT Individual Time: 0900-1000 OT Individual Time Calculation (min): 60 min    Short Term Goals: Week 3:  OT Short Term Goal 1 (Week 3): Pt will perform squat pivot transfers consistently with mod A to toilet/ BSC OT Short Term Goal 2 (Week 3): Pt will don LB clothing with mod A sit to stand OT Short Term Goal 3 (Week 3): Pt will perform 3/3 grooming tasks with min A sitting at the sink OT Short Term Goal 4 (Week 3): Pt will perform bed mobility with min A with extra time without bed rails and flat bed  Skilled Therapeutic Interventions/Progress Updates:    Treatment session with focus on participation in treatment session.  Pt seated on toilet with Stedy upon arrival.  Attempted to encourage pt to complete clothing management and transfer to w/c without use of Stedy, however pt adamantly refusing.  Completed sit > stand with use of Stedy with min-mod assist and pt able to pull pants over hips with steady assist to maintain standing balance.  Pt declined bathing/dressing.  Encouraged pt to participate in sit > stand and standing tolerance without UE support.  Pt with minimal motivation to attempt, however able to incorporate familiar Connect 4 activity with task.  Sit > stand at high low table with min-mod assist, therapist blocking RLE into weight bearing position and encouraged RUE placement on table to incorporate into task.  Issued pt elastic shoe laces to decrease burden of care with LB dressing.  Therapy Documentation Precautions:  Precautions Precautions: Fall Precaution Comments: R hemiparesis; poor insight into need for tx Restrictions Weight Bearing Restrictions: No Pain: Pain Assessment Pain Assessment: No/denies pain Pain Score: 0-No pain  See Function Navigator for Current Functional Status.   Therapy/Group: Individual Therapy  [name] 02/04/2015, 12:43 PM

## 2015-02-05 ENCOUNTER — Inpatient Hospital Stay (HOSPITAL_COMMUNITY): Payer: Self-pay | Admitting: Occupational Therapy

## 2015-02-05 ENCOUNTER — Inpatient Hospital Stay (HOSPITAL_COMMUNITY): Payer: Medicare Other | Admitting: Speech Pathology

## 2015-02-05 ENCOUNTER — Inpatient Hospital Stay (HOSPITAL_COMMUNITY): Payer: Medicare Other | Admitting: Occupational Therapy

## 2015-02-05 ENCOUNTER — Inpatient Hospital Stay (HOSPITAL_COMMUNITY): Payer: Medicare Other | Admitting: Physical Therapy

## 2015-02-05 LAB — GLUCOSE, CAPILLARY
Glucose-Capillary: 73 mg/dL (ref 65–99)
Glucose-Capillary: 112 mg/dL — ABNORMAL HIGH (ref 65–99)
Glucose-Capillary: 90 mg/dL (ref 65–99)
Glucose-Capillary: 117 mg/dL — ABNORMAL HIGH (ref 65–99)

## 2015-02-05 LAB — CBC
HCT: 27.8 % — ABNORMAL LOW (ref 36.0–46.0)
Hemoglobin: 9.1 g/dL — ABNORMAL LOW (ref 12.0–15.0)
MCH: 31.5 pg (ref 26.0–34.0)
MCHC: 32.7 g/dL (ref 30.0–36.0)
MCV: 96.2 fL (ref 78.0–100.0)
Platelets: 173 10*3/uL (ref 150–400)
RBC: 2.89 MIL/uL — ABNORMAL LOW (ref 3.87–5.11)
RDW: 14.6 % (ref 11.5–15.5)
WBC: 4.1 10*3/uL (ref 4.0–10.5)

## 2015-02-05 LAB — BASIC METABOLIC PANEL
Anion gap: 7 (ref 5–15)
BUN: 39 mg/dL — ABNORMAL HIGH (ref 6–20)
CO2: 22 mmol/L (ref 22–32)
Calcium: 9 mg/dL (ref 8.9–10.3)
Chloride: 118 mmol/L — ABNORMAL HIGH (ref 101–111)
Creatinine, Ser: 3.04 mg/dL — ABNORMAL HIGH (ref 0.44–1.00)
GFR calc Af Amer: 18 mL/min — ABNORMAL LOW (ref >60)
GFR calc non Af Amer: 15 mL/min — ABNORMAL LOW (ref >60)
Glucose, Bld: 100 mg/dL — ABNORMAL HIGH (ref 65–99)
Potassium: 4 mmol/L (ref 3.5–5.1)
Sodium: 147 mmol/L — ABNORMAL HIGH (ref 135–145)

## 2015-02-05 MED ORDER — INSULIN GLARGINE 100 UNIT/ML ~~LOC~~ SOLN
30.0000 [IU] | Freq: Every day | SUBCUTANEOUS | Status: DC
  Administered 2015-02-05 – 2015-02-06 (×2): 30 [IU] via SUBCUTANEOUS
  Filled 2015-02-05 (×3): qty 0.3

## 2015-02-05 MED ORDER — SORBITOL 70 % SOLN
960.0000 mL | TOPICAL_OIL | Freq: Once | ORAL | Status: DC
  Filled 2015-02-05: qty 240

## 2015-02-05 MED ORDER — BISACODYL 5 MG PO TBEC
5.0000 mg | DELAYED_RELEASE_TABLET | Freq: Every day | ORAL | Status: DC | PRN
  Administered 2015-02-05: 5 mg via ORAL
  Filled 2015-02-05: qty 1

## 2015-02-05 NOTE — Progress Notes (Signed)
Social Work Patient ID: Michelle Holmes, female   DOB: 1950-12-15, 65 y.o.   MRN: [id number] Met with pt and spoke with daughter-Denise to schedule family education prior to discharge Friday. She will be here Thursday at 9:00-12:00 to attend therapies with pt. Pt having a good day and in good spirits, laughing and joking with staff. Will work on discharge plans for Friday.

## 2015-02-05 NOTE — Progress Notes (Signed)
Speech Language Pathology Daily Session Note  Patient Details  Name: Michelle Holmes MRN: [id number] Date of Birth: [birth year]  Today's Date: 02/05/2015 SLP Individual Time: 1400-1500 SLP Individual Time Calculation (min): 60 min  Short Term Goals: Week 3: SLP Short Term Goal 1 (Week 3): Pt will consume trials of regular textures with min verbal cues for use of swallowing precautions over 2 targeted sessions prior to advancement.  SLP Short Term Goal 2 (Week 3): Pt will sustain her attention to basic familiar tasks for 7-10 minutes with min verbal cues for redirection.  SLP Short Term Goal 3 (Week 3): Pt will recall basic, daily information for 75% accuracy with mod verbal cues for use of external aids.  SLP Short Term Goal 4 (Week 3): Pt will identify at least 2 deficits occurring s/p CVA with max question cues.    Skilled Therapeutic Interventions:  Pt was seen for skilled ST targeting goals for dysphagia and cognition.  SLP facilitated the session with trials of regular textures to continues working towards diet progression.  Pt required mod verbal cues to not talk with food in her mouth and demonstrated left pocketing of solids which cleared with extra time and self initiated use of liquid wash.  No overt s/s of aspiration evident with solids or liquids.  Recommend advancing pt to regular textures with continued full supervision for use of swallowing precautions.  Pt sustained her attention to a basic, familiar board game for ~15 minutes with no cues needed for redirection.  She was able to plan and execute a problem solving strategy with min assist.  She also demonstrated improving intellectual awareness of deficits by requesting suppository after experiencing having difficulty while having a bowel movement.  Rn made aware.  Pt returned to room and left in wheelchair.  Continue per current plan of care.    Function:  Eating Eating   Modified Consistency Diet: No Eating Assist Level:  Supervision or verbal cues   Eating Set Up Assist For: Opening containers       Cognition Comprehension Comprehension assist level: Follows basic conversation/direction with extra time/assistive device  Expression   Expression assist level: Expresses basic 90% of the time/requires cueing < 10% of the time.  Social Interaction Social Interaction assist level: Interacts appropriately 75 - 89% of the time - Needs redirection for appropriate language or to initiate interaction.  Problem Solving Problem solving assist level: Solves basic 50 - 74% of the time/requires cueing 25 - 49% of the time  Memory Memory assist level: Recognizes or recalls 50 - 74% of the time/requires cueing 25 - 49% of the time    Pain Pain Assessment Pain Assessment: No/denies pain  Therapy/Group: Individual Therapy  Axelle Szwed, Selinda Orion 02/05/2015, 3:51 PM

## 2015-02-05 NOTE — Progress Notes (Signed)
[place] PHYSICAL MEDICINE & REHABILITATION     PROGRESS NOTE    Subjective/Complaints:  No issues overnite, last BM 1/5, "starting to bother me" but no abd pain or N/V   ROS: limited by mental status , denies breathing issues or pain  Objective: Vital Signs: Blood pressure 116/65, pulse 65, temperature 98.2 F (36.8 C), temperature source Oral, resp. rate 18, weight 103.4 kg (227 lb 15.3 oz), SpO2 99 %. No results found.  Recent Labs  02/05/15 0550  WBC 4.1  HGB 9.1*  HCT 27.8*  PLT 173    Recent Labs  02/05/15 0550  NA 147*  K 4.0  CL 118*  GLUCOSE 100*  BUN 39*  CREATININE 3.04*  CALCIUM 9.0   CBG (last 3)   Recent Labs  02/04/15 1641 02/04/15 2043 02/05/15 0655  GLUCAP 162* 174* 73    Wt Readings from Last 3 Encounters:  02/05/15 103.4 kg (227 lb 15.3 oz)  01/14/15 103.193 kg (227 lb 8 oz)  01/12/15 102.513 kg (226 lb)    Physical Exam:  BP 116/65 mmHg  Pulse 65  Temp(Src) 98.2 F (36.8 C) (Oral)  Resp 18  Wt 103.4 kg (227 lb 15.3 oz)  SpO2 99% Constitutional: NAD. Vital signs reviewed.  HENT: Normocephalic and atraumatic.  Eyes: Conjunctivae and EOM are normal.  Cardiovascular: Normal rate and regular rhythm.  Respiratory: Effort normal and breath sounds normal. No respiratory distress. She has no wheezes.  GI: Soft. Bowel sounds are normal. There is no tenderness.  Musculoskeletal: She exhibits no edema or tenderness.  Neurological: She is alert, confused , not oriented  Right facial weakness with dysarthric speech Exam limited by poor participation.  Motor: RUE 0/5 LUE 5/5 LLE 4+/5 RLE 4 minus/5  Right facial weakness Skin: Skin is warm and dry. No erythema.  Psychiatric: Her affect is blunt. Her speech is delayed. She is slowed. Cognition and memory are impaired. She expresses inappropriate judgment. Oriented to person, place , not time  Assessment/Plan: 1. Functional deficits secondary to Left subthalamic lacunar infarct  causing RIght hemiparesis  which require 3+ hours per day of interdisciplinary therapy in a comprehensive inpatient rehab setting. Physiatrist is providing close team supervision and 24 hour management of active medical problems listed below. Physiatrist and rehab team continue to assess barriers to discharge/monitor patient progress toward functional and medical goals.  Function:  Bathing Bathing position Bathing activity did not occur: Refused Position: Production manager parts bathed by patient: Front perineal area (just bathed periarea today) Body parts bathed by helper: Buttocks  Bathing assist Assist Level: Touching or steadying assistance(Pt > 75%)      Upper Body Dressing/Undressing Upper body dressing Upper body dressing/undressing activity did not occur: Refused What is the patient wearing?: Pull over shirt/dress     Pull over shirt/dress - Perfomed by patient: Thread/unthread left sleeve, Put head through opening, Pull shirt over trunk Pull over shirt/dress - Perfomed by helper: Thread/unthread right sleeve        Upper body assist Assist Level: Touching or steadying assistance(Pt > 75%)      Lower Body Dressing/Undressing Lower body dressing Lower body dressing/undressing activity did not occur: Refused What is the patient wearing?: Pants, Socks, Shoes (brief- total A)       Pants- Performed by helper: Thread/unthread right pants leg, Thread/unthread left pants leg, Pull pants up/down   Non-skid slipper socks- Performed by helper: Don/doff right sock, Don/doff left sock   Socks - Performed by  helper: Don/doff right sock, Don/doff left sock   Shoes - Performed by helper: Don/doff right shoe, Don/doff left shoe, Fasten right, Fasten left          Lower body assist Assist for lower body dressing: Touching or steadying assistance (Pt > 75%)      Toileting Toileting Toileting activity did not occur: N/A Toileting steps completed by patient: Adjust  clothing after toileting Toileting steps completed by helper: Performs perineal hygiene Toileting Assistive Devices: Grab bar or rail  Toileting assist Assist level: Touching or steadying assistance (Pt.75%)   Transfers Chair/bed transfer   Chair/bed transfer method: Squat pivot Chair/bed transfer assist level: Moderate assist (Pt 50 - 74%/lift or lower) Chair/bed transfer assistive device: Armrests Mechanical lift: Stedy   Locomotion Ambulation Ambulation activity did not occur: Refused   Max distance: 15 Assist level: Moderate assist (Pt 50 - 74%)   Wheelchair Wheelchair activity did not occur: Refused Type: Manual Max wheelchair distance: 30 Assist Level: Moderate assistance (Pt 50 - 74%)  Cognition Comprehension Comprehension assist level: Follows basic conversation/direction with extra time/assistive device  Expression Expression assist level: Expresses basic 90% of the time/requires cueing < 10% of the time.  Social Interaction Social Interaction assist level: Interacts appropriately 75 - 89% of the time - Needs redirection for appropriate language or to initiate interaction.  Problem Solving Problem solving assist level: Solves basic 50 - 74% of the time/requires cueing 25 - 49% of the time  Memory Memory assist level: Recognizes or recalls 50 - 74% of the time/requires cueing 25 - 49% of the time    Medical Problem List and Plan: 1. Dysphagia, Aphasia,right  hemiparesis secondary to Left subthalamic lacunar infarct, UE >> LE weakness  Cont CIR 2. DVT Prophylaxis/Anticoagulation: Pharmaceutical: Lovenox-borderline low plt monitor 3. Chronic back pain/ Pain Management:lumbar stenosis and scoliosis , severe, No Pain c/o  On lyrica bid. Off OxyContin and ultram  Will usetylenol prn to prevent sedation.  4. Mood: LCSW to follow for evaluation and support.  5. Neuropsych: This patient is not capable of making decisions on her own behalf.Pt states she needs to pay bills,  will ask SW to coordinate family to help with this    Has stroke related cognitive deficits, can direct who is her POA but not manage own funds  6. Skin/Wound Care: Maintain adequate nutrition and hydration status. Routine pressure relief measures.  7. Fluids/Electrolytes/Nutrition: Monitor I/O. Offer supplements if intake poor.  8. COPD: Continue IS. On dulera bid.  9. OSA/OHS: Question use of CPAP at bedtime 10. Dyslipidemia: Continue Lipitor and fenofibrate. 11.DM type 2: Monitor BS ac/hs.  , CBGs improving on glipizide 5mg  qam and Lantus 35U qhs- am CBG on low side will reduce lantus to 30U CBG (last 3)   Recent Labs  02/04/15 1641 02/04/15 2043 02/05/15 0655  GLUCAP 162* 174* 73    12. HTN: Monitor and slowly resume home medications.   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 Encourage po intake.   Cr. 3.19 on 1/6.close to baseline 14. Anemia  Hb 9.6 on 1/4- awaiting stool guaic but no BM x several days 15. Thrombocytopenia-Mild may be lovenox related,   Plts 149 on 1/4-CBC- CBC pending 16.  No BM x 6 days- Change colace to senna S , Dulcolax tab this am , SMOG this pm LOS (Days) 18 A FACE TO FACE EVALUATION WAS PERFORMED  Charlett Blake 02/05/2015 7:39 AM

## 2015-02-05 NOTE — Progress Notes (Signed)
Physical Therapy Session Note  Patient Details  Name: Michelle Holmes MRN: [id number] Date of Birth: 1950/06/30  Today's Date: 02/05/2015 PT Individual Time: 1000-1100 PT Individual Time Calculation (min): 60 min   Short Term Goals: Week 3:  PT Short Term Goal 1 (Week 3): Pt will perform supine <>sit with S using hospital bed functions PT Short Term Goal 2 (Week 3): Pt will perform squat pivot transfer consistent modA to R/L side PT Short Term Goal 3 (Week 3): Pt will perform gait with LRAD x30' modA PT Short Term Goal 4 (Week 3): Pt will propel w/c with BUE 50' modA  Skilled Therapeutic Interventions/Progress Updates:    Pt received seated in w/c, no c/o pain and agreeable to treatment however adamant that "you better tell me exactly what we're doing because I don't know about all this"; agreeable to go to gym. One gait trial with RW and RLE AFO x5', +2A for w/c follow and maxA for RLE progression, an increase in level of assist for RLE management over yesterdays session. Squat pivot transfer to [employer] with arm rests. Nustep with BLE x10 min with RLE adduction assist, performed for LE strengthening, endurance, RLE NMR. Gait 2x20' with L rail and R AFO. One trial x20' with RW. Requires modA overall, occasional max for standing balance with LOB to R side. Variable min>maxA for RLE progression. Returned to room and remained seated in w/c at completion of session. Pt declines quick release belt at completion of session, changed safety plan to reflect d/c of safety belt and pt educated regarding safety, use of safety belt.    Therapy Documentation Precautions:  Precautions Precautions: Fall Precaution Comments: R hemiparesis; poor insight into need for tx Restrictions Weight Bearing Restrictions: No Pain: Pain Assessment Pain Assessment: No/denies pain Pain Score: 0-No pain   See Function Navigator for Current Functional Status.   Therapy/Group: Individual Therapy  Luberta Mutter 02/05/2015, 12:30 PM

## 2015-02-05 NOTE — Progress Notes (Signed)
Occupational Therapy Session Note  Patient Details  Name: Michelle Holmes MRN: [id number] Date of Birth: 1950-02-22  Today's Date: 02/05/2015 OT Individual Time: [id number] Individual Time Calculation (min): 60 min    Short Term Goals: Week 3:  OT Short Term Goal 1 (Week 3): Pt will perform squat pivot transfers consistently with mod A to toilet/ BSC OT Short Term Goal 2 (Week 3): Pt will don LB clothing with mod A sit to stand OT Short Term Goal 3 (Week 3): Pt will perform 3/3 grooming tasks with min A sitting at the sink OT Short Term Goal 4 (Week 3): Pt will perform bed mobility with min A with extra time without bed rails and flat bed  Skilled Therapeutic Interventions/Progress Updates:    Treatment session with focus on ADL retraining, functional transfers, sit > stand, and increased participation in self-care tasks.  Upon arrival, pt in good spirits and requesting to take a shower.  Performed squat pivot transfer w/c to shower chair with use of grab bar and mod assist for anterior weight shift and lifting.  Pt completed bathing with use of hand held shower head while seated, requiring assistance to lift RUE to get underneath.  Pt joking and in good spirits throughout session.  Increased participation in with LB dressing with pt able to lift Rt and Lt legs to assist with threading pants, but unwilling to attempt reaching towards feet or crossing leg to don pants.  Pt requesting to don AFO in preparation for ambulation with PT.  Engaged in Connect 4 activity per pt request at end of session with focus on Rt attention and sustained attention to task.  Therapy Documentation Precautions:  Precautions Precautions: Fall Precaution Comments: R hemiparesis; poor insight into need for tx Restrictions Weight Bearing Restrictions: No Pain: Pain Assessment Pain Assessment: No/denies pain Pain Score: 0-No pain  See Function Navigator for Current Functional Status.   Therapy/Group: Individual  Therapy  [name] 02/05/2015, 11:19 AM

## 2015-02-05 NOTE — Progress Notes (Signed)
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   Willeen Cass [facility] 02/05/2015, 3:43 PM

## 2015-02-05 NOTE — Plan of Care (Signed)
Problem: RH Balance Goal: LTG Patient will maintain dynamic standing balance (PT) LTG: Patient will maintain dynamic standing balance with assistance during mobility activities (PT)  downgraded d/t slow progress  Problem: RH Bed Mobility Goal: LTG Patient will perform bed mobility with assist (PT) LTG: Patient will perform bed mobility with assistance, with/without cues (PT).  downgraded d/t slow progress  Problem: RH Ambulation Goal: LTG Patient will ambulate in controlled environment (PT) LTG: Patient will ambulate in a controlled environment, # of feet with assistance (PT).  downgraded d/t slow progress Goal: LTG Patient will ambulate in home environment (PT) LTG: Patient will ambulate in home environment, # of feet with assistance (PT).  Outcome: Not Applicable Date Met:  73/34/48 D/c due to pt discharging at w/c level  Problem: RH Wheelchair Mobility Goal: LTG Patient will propel w/c in controlled environment (PT) LTG: Patient will propel wheelchair in controlled environment, # of feet with assist (PT)  downgraded d/t slow progress Goal: LTG Patient will propel w/c in community environment (PT) LTG: Patient will propel wheelchair in community environment, # of feet with assist (PT)  Outcome: Not Applicable Date Met:  30/15/99 D/c; pt will not self-propel in community  Problem: RH Memory Goal: LTG Patient demonstrate ability for day to day recall (PT) LTG: Patient will demonstrate ability for day to day recall/carryover during mobility activities with assist (PT)  downgraded d/t slow progress

## 2015-02-05 NOTE — Plan of Care (Signed)
Problem: RH Expression Communication Goal: LTG Patient will increase speech intelligibility (SLP) LTG: Patient will increase speech intelligibility at word/phrase/conversation level with cues, % of the time (SLP)  Outcome: Not Applicable Date Met:  72/76/18 Discharged to allow therapies to focus on cognition   Problem: RH Attention Goal: LTG Patient will demonstrate focused/sustained (SLP) LTG: Patient will demonstrate focused/sustained/selective/alternating/divided attention during cognitive/linguistic activities in specific environment with assist for # of minutes (SLP)  Downgraded due to inconsistent progress in therapies   Problem: RH Awareness Goal: LTG: Patient will demonstrate intellectual/emergent (SLP) LTG: Patient will demonstrate intellectual/emergent/anticipatory awareness with assist during a cognitive/linguistic activity (SLP)  Downgraded due to inconsistent progress

## 2015-02-06 ENCOUNTER — Inpatient Hospital Stay (HOSPITAL_COMMUNITY): Payer: Self-pay | Admitting: Occupational Therapy

## 2015-02-06 ENCOUNTER — Inpatient Hospital Stay (HOSPITAL_COMMUNITY): Payer: Medicare Other | Admitting: Occupational Therapy

## 2015-02-06 ENCOUNTER — Inpatient Hospital Stay (HOSPITAL_COMMUNITY): Payer: Medicare Other | Admitting: Physical Therapy

## 2015-02-06 ENCOUNTER — Inpatient Hospital Stay (HOSPITAL_COMMUNITY): Payer: Medicare Other | Admitting: Speech Pathology

## 2015-02-06 LAB — GLUCOSE, CAPILLARY
Glucose-Capillary: 113 mg/dL — ABNORMAL HIGH (ref 65–99)
Glucose-Capillary: 114 mg/dL — ABNORMAL HIGH (ref 65–99)
Glucose-Capillary: 174 mg/dL — ABNORMAL HIGH (ref 65–99)
Glucose-Capillary: 178 mg/dL — ABNORMAL HIGH (ref 65–99)

## 2015-02-06 NOTE — Progress Notes (Signed)
Social Work Patient ID: Michelle Holmes, female   DOB: 05-17-1950, 65 y.o.   MRN: [id number] Met with pt and spoke with daughter-Denise regarding team conference progress and the possibility of going home tomorrow after education with daughter for grandson's birthday. Both agreeable to the plans, daughter will be here at 9:00 tomorrow to begin family education. Prefers to use [facility] for home health and equipment-wheelchair only need. Pt very pleased with this Plan and will look forward to tomorrow.

## 2015-02-06 NOTE — Patient Care Conference (Signed)
Inpatient RehabilitationTeam Conference and Plan of Care Update Date: 02/06/2015   Time: 11:20 AM    Patient Name: Michelle Holmes      Medical Record Number: [id number]  Date of Birth: [birth year] Sex: Female         Room/Bed: 4W23C/4W23C-01 Payor Info: Payor: [employer] MEDICARE / Plan: [facility] MEDICARE / Product Type: *No Product type* /    Admitting Diagnosis: L CVA2  Admit Date/Time:  01/18/2015  4:52 PM Admission Comments: No comment available   Primary Diagnosis:  <principal problem not specified> Principal Problem: <principal problem not specified>  Patient Active Problem List   Diagnosis Date Noted  . Absolute anemia   . Thrombocytopenia ([place])   . Acute ischemic VBA thalamic stroke ([place]) 01/18/2015  . AKI (acute kidney injury) ([place])   . Dysarthria   . Lethargy   . DM type 2 with diabetic peripheral neuropathy ([place])   . Labile blood pressure   . Hyperlipidemia   . History of CVA with residual deficit   . Chronic obstructive pulmonary disease ([place])   . Hemiparesis, aphasia, and dysphagia as late effect of cerebrovascular accident (CVA) ([place])   . Acute ischemic stroke ([place])   . CVA (cerebral vascular accident) ([place]) 01/14/2015  . CVA (cerebral infarction) 01/14/2015  . Right hemiplegia ([place])   . Right sided weakness 01/13/2015  . Abdominal pain 06/20/2014  . Altered mental status 02/08/2012  . Sleep apnea 12/08/2010  . Morbid obesity ([place]) 12/08/2010  . CHF (congestive heart failure) ([place]) 12/08/2010  . Chronic renal failure 12/08/2010  . TIA (transient ischemic attack) 12/08/2010  . CAD (coronary artery disease) 12/08/2010  . Cellulitis of pubic region 12/01/2010  . Uncontrolled type 2 DM with hyperosmolar nonketotic hyperglycemia ([place]) 12/01/2010    Expected Discharge Date: Expected Discharge Date: 02/07/15  Team Members Present: Physician leading conference: Dr. Alysia Penna Social Worker Present: Ovidio Kin, LCSW Nurse Present: Dorien Chihuahua, RN PT  Present: Kem Parkinson, PT OT Present: Willeen Cass, OT SLP Present: Windell Moulding, SLP PPS Coordinator present : Daiva Nakayama, RN, CRRN     Current Status/Progress Goal Weekly Team Focus  Medical   Patient with improved affect, looking forward to discharge, still requiring physical assistance, improved motivation and cooperation.  train family  d/c planning   Bowel/Bladder   Continent of bowel and bladder.LBM 02/05/15 after suppository  Mod I   Continue to educate and treat constipation   Swallow/Nutrition/ Hydration   Upgraded to regular textures, thin liquids; full supervision  supervision   completion of family education prior to discharge, toleration of diet upgrade    ADL's   mod assist toilet and shower transfers, mod assist sit > stand, total assist LB dressing, min-mod assist UB dressing.  Pt continues to fluctuate in participation and motivation in therapy sessions  max LB dressing, mod shower transfer, min assist toilet transfer, bathing and UB dressing  participation in treatment sessions, ADL retraining, transfers, sit > stand, RUE NMR   Mobility   modA squat pivot, mod/max gait with RW x15', minA sit <>stand, min/mod bed mobility. Limited by participation.  downgraded to modA, w/c level overall   RLE NMR, gait training, activity tolerance, transfers   Communication   continues with mild-moderate dysarthria, goals discontinued to allow focus on cognitive goals          Safety/Cognition/ Behavioral Observations  severe deficits, can fluctuate from supervision-max assist for basic tasks depending on motivatoin   min-max assist   basic cognition, family  education prior to discharge    Pain   Noc/o pain   <3  Assess for nonverbal cues of pain   Skin   Skin dry and flaky. Skin tear to L buttock, OTA  No additional skin breakdown  Assess skin q shift      *See Care Plan and progress notes for long and short-term goals.  Barriers to Discharge: family has not been in with  training    Possible Resolutions to Barriers:  SW to contact family , has 3h/d    Discharge Planning/Teaching Needs:  Denise-daughter to be here tomorrow for family education. Aware pt will require 24 hr physical care, reports the family will provide this      Team Discussion:  Upgraded diet to regular-thin If motivated to move and attend therapies she does, otherwise she is much assist-max level. Flucuates in participation, but can range from min to max assist. Family education tomorrow and then if goes well discharge so can attend grandson's birthday. This motivates pt, daughter contacted and aware of plan.  Revisions to Treatment Plan:  Downgraded goals to mod/a wheelchair    Continued Need for Acute Rehabilitation Level of Care: The patient requires daily medical management by a physician with specialized training in physical medicine and rehabilitation for the following conditions: Daily direction of a multidisciplinary physical rehabilitation program to ensure safe treatment while eliciting the highest outcome that is of practical value to the patient.: Yes Daily medical management of patient stability for increased activity during participation in an intensive rehabilitation regime.: Yes Daily analysis of laboratory values and/or radiology reports with any subsequent need for medication adjustment of medical intervention for : Neurological problems  Tashonda Pinkus, [name] 02/06/2015, 3:00 PM

## 2015-02-06 NOTE — Progress Notes (Signed)
Orthopedic Tech Progress Note Patient Details:  Michelle Holmes 03-09-1950 [id number]  Patient ID: Michelle Holmes, female   DOB: 01-12-1951, 65 y.o.   MRN: [id number] Called in advanced brace order;POKE WITH Cletis Athens, Joycie Aerts 02/06/2015, 10:37 AM

## 2015-02-06 NOTE — Progress Notes (Signed)
[facility]     PROGRESS NOTE    Subjective/Complaints:  "want to go home and to the bank"   ROS: limited by mental status , denies breathing issues or pain  Objective: Vital Signs: Blood pressure 141/68, pulse 65, temperature 98.8 F (37.1 C), temperature source Oral, resp. rate 16, weight 102.1 kg (225 lb 1.4 oz), SpO2 97 %. No results found.  Recent Labs  02/05/15 0550  WBC 4.1  HGB 9.1*  HCT 27.8*  PLT 173    Recent Labs  02/05/15 0550  NA 147*  K 4.0  CL 118*  GLUCOSE 100*  BUN 39*  CREATININE 3.04*  CALCIUM 9.0   CBG (last 3)   Recent Labs  02/05/15 1653 02/05/15 2119 02/06/15 0653  GLUCAP 90 117* 113*    Wt Readings from Last 3 Encounters:  02/06/15 102.1 kg (225 lb 1.4 oz)  01/14/15 103.193 kg (227 lb 8 oz)  01/12/15 102.513 kg (226 lb)    Physical Exam:  BP 141/68 mmHg  Pulse 65  Temp(Src) 98.8 F (37.1 C) (Oral)  Resp 16  Wt 102.1 kg (225 lb 1.4 oz)  SpO2 97% Constitutional: NAD. Vital signs reviewed.  HENT: Normocephalic and atraumatic.  Eyes: Conjunctivae and EOM are normal.  Cardiovascular: Normal rate and regular rhythm.  Respiratory: Effort normal and breath sounds normal. No respiratory distress. She has no wheezes.  GI: Soft. Bowel sounds are normal. There is no tenderness.  Musculoskeletal: She exhibits no edema or tenderness.  Neurological: She is alert, confused , not oriented  Right facial weakness with dysarthric speech Exam limited by poor participation.  Motor: RUE 0/5 LUE 5/5 LLE 4+/5 RLE 4 minus/5  Right facial weakness Skin: Skin is warm and dry. No erythema.  Psychiatric: Her affect is blunt. Her speech is delayed. She is slowed. Cognition and memory are impaired. She expresses inappropriate judgment. Oriented to person, place , not time  Assessment/Plan: 1. 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 Physiatrist is providing close team supervision and 24 hour management of active medical problems listed below. Physiatrist and rehab team continue to assess barriers to discharge/monitor patient progress toward functional and medical goals.  Function:  Bathing Bathing position Bathing activity did not occur: Refused Position: Production manager parts bathed by patient: Right arm, Chest, Abdomen, Front perineal area, Right upper leg, Left upper leg, Right lower leg, Left lower leg Body parts bathed by helper: Left arm, Back  Bathing assist Assist Level: Touching or steadying assistance(Pt > 75%)      Upper Body Dressing/Undressing Upper body dressing Upper body dressing/undressing activity did not occur: Refused What is the patient wearing?: Pull over shirt/dress     Pull over shirt/dress - Perfomed by patient: Thread/unthread left sleeve, Put head through opening, Pull shirt over trunk Pull over shirt/dress - Perfomed by helper: Thread/unthread right sleeve        Upper body assist Assist Level: Touching or steadying assistance(Pt > 75%)      Lower Body Dressing/Undressing Lower body dressing Lower body dressing/undressing activity did not occur: Refused What is the patient wearing?: Pants, Socks, Shoes, AFO       Pants- Performed by helper: Thread/unthread right pants leg, Thread/unthread left pants leg, Pull pants up/down   Non-skid slipper socks- Performed by helper: Don/doff right sock, Don/doff left sock  Socks - Performed by helper: Don/doff right sock, Don/doff left sock   Shoes - Performed by helper: Don/doff right shoe, Don/doff left shoe   AFO - Performed by helper: Don/doff right AFO      Lower body assist Assist for lower body dressing:  (Total assist)      Toileting Toileting Toileting activity did not occur: N/A Toileting steps completed by patient: Adjust clothing  prior to toileting, Performs perineal hygiene Toileting steps completed by helper: Performs perineal hygiene, Adjust clothing prior to toileting, Adjust clothing after toileting Toileting Assistive Devices: Grab bar or rail  Toileting assist Assist level: Touching or steadying assistance (Pt.75%)   Transfers Chair/bed transfer   Chair/bed transfer method: Squat pivot Chair/bed transfer assist level: Moderate assist (Pt 50 - 74%/lift or lower) Chair/bed transfer assistive device: Armrests Mechanical lift: Stedy   Locomotion Ambulation Ambulation activity did not occur: Refused   Max distance: 20 Assist level: Moderate assist (Pt 50 - 74%)   Wheelchair Wheelchair activity did not occur: Refused Type: Manual Max wheelchair distance: 30 Assist Level: Moderate assistance (Pt 50 - 74%)  Cognition Comprehension Comprehension assist level: Follows basic conversation/direction with extra time/assistive device  Expression Expression assist level: Expresses basic 90% of the time/requires cueing < 10% of the time.  Social Interaction Social Interaction assist level: Interacts appropriately 75 - 89% of the time - Needs redirection for appropriate language or to initiate interaction.  Problem Solving Problem solving assist level: Solves basic 50 - 74% of the time/requires cueing 25 - 49% of the time  Memory Memory assist level: Recognizes or recalls 50 - 74% of the time/requires cueing 25 - 49% of the time    Medical Problem List and Plan: 1. Dysphagia, Aphasia,right  hemiparesis secondary to Left subthalamic lacunar infarct, Team conference today please see physician documentation under team conference tab, met with team face-to-face to discuss problems,progress, and goals. Formulized individual treatment plan based on medical history, underlying problem and comorbidities.  Cont CIR 2. DVT Prophylaxis/Anticoagulation: Pharmaceutical: Lovenox-borderline low plt monitor 3. Chronic back pain/  Pain Management:lumbar stenosis and scoliosis , severe, No Pain c/o  On lyrica bid. Off OxyContin and ultram  Will usetylenol prn to prevent sedation.  4. Mood: LCSW to follow for evaluation and support.  5. Neuropsych: This patient is not capable of making decisions on her own behalf.Pt states she needs to pay bills, will ask SW to coordinate family to help with this    Has stroke related cognitive deficits, can direct who is her POA but not manage own funds  6. Skin/Wound Care: Maintain adequate nutrition and hydration status. Routine pressure relief measures.  7. Fluids/Electrolytes/Nutrition: Monitor I/O. Offer supplements if intake poor.  8. COPD: Continue IS. On dulera bid.  9. OSA/OHS: Question use of CPAP at bedtime 10. Dyslipidemia: Continue Lipitor and fenofibrate. 11.DM type 2: Monitor BS ac/hs.  , CBGs improving on glipizide 59m qam and Lantus 30U -looks well controlled currently CBG (last 3)   Recent Labs  02/05/15 1653 02/05/15 2119 02/06/15 0653  GLUCAP 90 117* 113*    12. HTN: Monitor and slowly resume home medications.   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 Encourage po intake.   Cr. 3.19 on 1/6.close to baseline 14. Anemia  Hb 9.6 on 1/4- awaiting stool guaic but no BM x several days 15. Thrombocytopenia-Mild may be lovenox related,   Plts 149 on 1/4-CBC- CBC pending 16.  Constipation improved- Change colace to senna S , responded to SMOG, discussed diet, eats more fruits and veggies at home LOS (Days) [place] 02/06/2015  7:51 AM

## 2015-02-06 NOTE — Progress Notes (Signed)
Occupational Therapy Session Note  Patient Details  Name: CIARA SOULIA MRN: [id number] Date of Birth: 08-Apr-1950  Today's Date: 02/06/2015 OT Individual Time: [id number] OT Individual Time Calculation (min): 30 min    Skilled Therapeutic Interventions/Progress Updates:    Pt worked on [employer] exercises with the RUE to begin session.  Increased tightness noted with shoulder flexion to begin session, but with ROM able to take pt to 150 degrees without any sign of pain.  Pt attempts to assist with shoulder flexion, demonstrating only trace movements at this time.  No active elbow flexion or hand movement to command during this session.  Completed 10 repetitions for shoulder flexion, 5 repetitions for elbow flexion and extension.  Practiced squat pivot transfers to her new wheelchair during session.  Pt needed max coaxing and mod assist for transfer to the right side.  Min assist needed for squat/stand pivot back to the left.  Noted pt with 1/2 lap tray for wheelchair to take home was for the LUE.  SW notified as pt needs one for the left side.  Pt left in wheelchair with call button and phone within reach.    Therapy Documentation Precautions:  Precautions Precautions: Fall Precaution Comments: R hemiparesis; poor insight into need for tx Restrictions Weight Bearing Restrictions: No  Pain: Pain Assessment Pain Assessment: No/denies pain Pain Score: 0-No pain ADL: See Function Navigator for Current Functional Status.   Therapy/Group: Individual Therapy  Kateline Kinkade OTR/L 02/06/2015, 4:46 PM

## 2015-02-06 NOTE — Progress Notes (Signed)
Physical Therapy Session Note  Patient Details  Name: Michelle Holmes MRN: [id number] Date of Birth: 02/16/1950  Today's Date: 02/06/2015 PT Individual Time: 1300-1400 PT Individual Time Calculation (min): 60 min   Short Term Goals: Week 3:  PT Short Term Goal 1 (Week 3): Pt will perform supine <>sit with S using hospital bed functions PT Short Term Goal 2 (Week 3): Pt will perform squat pivot transfer consistent modA to R/L side PT Short Term Goal 3 (Week 3): Pt will perform gait with LRAD x30' modA PT Short Term Goal 4 (Week 3): Pt will propel w/c with BUE 50' modA  Skilled Therapeutic Interventions/Progress Updates:    Pt received seated on toilet with handoff from NA. No c/o pain and agreeable to treatment. Sit>stand from toilet using grab bar and minA; total A to don pants. Stand pivot modA to w/c with cueing for foot placement and sequencing. Orthotist present for first half of session to assess mobility and AFO fit. Trialed PLS and GRAFO 2x5-10' each with pt fatiguing easily. Note improved stance control with GRAFO vs PLS. However significant hip ER with either unable to correct. One trial with GRAFO and heel wedge to reduce R knee recurvatum with observable improvement in knee control. Gait x2 trials with L rail x20' modA, improved weight shift and balance with rail compared to RW due to safety with weight shift and security of stable rail. Pt became very angry when informed the next activity would be performing car transfer in preparation for going home tomorrow. Allowed pt to calm down, educated regarding importance of practicing transfers to reduce strain on family members to assist. Car transfer x1 trial with modA. Returned to room and remained seated in w/c with all needs in reach at completion of session.   Therapy Documentation Precautions:  Precautions Precautions: Fall Precaution Comments: R hemiparesis; poor insight into need for tx Restrictions Weight Bearing Restrictions:  No Pain: Pain Assessment Pain Assessment: No/denies pain Pain Score: 0-No pain   See Function Navigator for Current Functional Status.   Therapy/Group: Individual Therapy  Luberta Mutter 02/06/2015, 3:01 PM

## 2015-02-06 NOTE — Progress Notes (Signed)
Occupational Therapy Session Note  Patient Details  Name: Michelle Holmes MRN: [id number] Date of Birth: 1950-08-28  Today's Date: 02/06/2015 OT Individual Time: 0900-1000 OT Individual Time Calculation (min): 60 min   Skilled Therapeutic Interventions/Progress Updates:    1:1 self care retraining : pt requested to only participate in dressing since she shower yesterday. Pt participated in picking out her clothing with mod encouragement. Pt immediately got upset about attempting to participate in dressing cursing at therapist and proceeded to hit therapist on the side of the head. Directed pt to this was not an acceptable behavior; pt apologized and the proceeded to report she was going to try and participate in dressing with her best effort.  Pt still requires min A to don shirt and max A for LB dressing. Transfer training from w/c<>bed with min A with max cues for proper setup. Pt transitioned into supine with mod A and into side lying on left side. NMR with focus on activation of right UE movement in gravity eliminated plane with min to mod A with extra time to promote normal patterns of movement and break up developing tone in UE. Pt able to activate shoulder protraction, retraction elevation and horizontal abduction and adduction with mod A. Pt transitioned supine to sit with mod A for trunk control.  In conference discussed with MD rest hand splint for increased tone in right hand (for night time use)  Therapy Documentation Precautions:  Precautions Precautions: Fall Precaution Comments: R hemiparesis; poor insight into need for tx Restrictions Weight Bearing Restrictions: No Pain: Pain Assessment Pain Assessment: No/denies pain Pain Score: 0-No pain  See Function Navigator for Current Functional Status.   Therapy/Group: Individual Therapy  Willeen Cass [facility] 02/06/2015, 12:07 PM

## 2015-02-06 NOTE — Progress Notes (Signed)
Nutrition Follow-up  DOCUMENTATION CODES:   Obesity unspecified  INTERVENTION:  Continue Boost Breeze po BID, each supplement provides 250 kcal and 9 grams of protein.  Encourage adequate PO intake.   NUTRITION DIAGNOSIS:   Increased nutrient needs related to chronic illness as evidenced by estimated needs; ongoing  GOAL:   Patient will meet greater than or equal to 90% of their needs; met  MONITOR:   PO intake, Supplement acceptance, Weight trends, Labs, I & O's  REASON FOR ASSESSMENT:   Consult Poor PO  ASSESSMENT:   65 year old right handed female with history of HTN, chronic diastolic CHF, COPD, OAS/OHS--oxygen dependent, CAD, CVA with residual right hemiparesis and dysarthria who was admitted on 01/13/15 with worsening of right sided weakness and elevated BS- 504. MRI brain done revealing acute infarct left thalamus and left cerebral peduncle adjacent to limb of left internal capsul and tiny acute ischemic infarct right basal ganglia.   Meal completion has been varied from 25-100%, however most meals have been 100%. Pt currently has Boost Breeze ordered with varied intake. RD to continue with orders to aid in caloric and protein needs. Plans for possibly pt going home tomorrow after education.   Diet Order:  Diet Carb Modified Fluid consistency:: Thin; Room service appropriate?: Yes  Skin:  Reviewed, no issues  Last BM:  1/10  Height:   Ht Readings from Last 1 Encounters:  01/14/15 5' 5"  (1.651 m)    Weight:   Wt Readings from Last 1 Encounters:  02/06/15 225 lb 1.4 oz (102.1 kg)    Ideal Body Weight:  56.8 kg  BMI:  Body mass index is 37.46 kg/(m^2).  Estimated Nutritional Needs:   Kcal:  1850-2050  Protein:  90-100 grams  Fluid:  1.8 - 2 L/day  EDUCATION NEEDS:   No education needs identified at this time  Corrin Parker, MS, RD, LDN Pager # [phone] After hours/ weekend pager # [phone]

## 2015-02-06 NOTE — Progress Notes (Signed)
Speech Language Pathology Daily Session Note  Patient Details  Name: Michelle Holmes MRN: [id number] Date of Birth: 01-02-1951  Today's Date: 02/06/2015 SLP Individual Time: 0804-0900 SLP Individual Time Calculation (min): 56 min  Short Term Goals: Week 3: SLP Short Term Goal 1 (Week 3): Pt will consume trials of regular textures with min verbal cues for use of swallowing precautions over 2 targeted sessions prior to advancement.  SLP Short Term Goal 2 (Week 3): Pt will sustain her attention to basic familiar tasks for 7-10 minutes with min verbal cues for redirection.  SLP Short Term Goal 3 (Week 3): Pt will recall basic, daily information for 75% accuracy with mod verbal cues for use of external aids.  SLP Short Term Goal 4 (Week 3): Pt will identify at least 2 deficits occurring s/p CVA with max question cues.    Skilled Therapeutic Interventions:  Pt was seen for skilled ST targeting cognitive goals.  Pt had recently completed breakfast prior to SLP's arrival and declined additional POs of regular textures; however, nurse tech reports excellent toleration of recently upgraded solids.  No evidence of oral residue remaining in the oral cavity upon SLP's arrival or upon completion of oral care.  Recommend that pt remain on regular textures and thin liquids.  Pt demonstrated improved independence for completion of basic self care tasks and was able to complete oral care with mod I functional problem solving and sequencing for opening containers using hemi technique.  Pt utilized a menu to order lunch and dinner with supervision for attention to task to locate information.  Pt was left in wheelchair with call bell left within reach.  Continue per current plan of care.   Function:  Eating Eating                 Cognition Comprehension Comprehension assist level: Follows basic conversation/direction with extra time/assistive device  Expression   Expression assist level: Expresses basic 90% of  the time/requires cueing < 10% of the time.  Social Interaction Social Interaction assist level: Interacts appropriately 75 - 89% of the time - Needs redirection for appropriate language or to initiate interaction.  Problem Solving Problem solving assist level: Solves basic 50 - 74% of the time/requires cueing 25 - 49% of the time  Memory Memory assist level: Recognizes or recalls 50 - 74% of the time/requires cueing 25 - 49% of the time    Pain Pain Assessment Pain Assessment: No/denies pain Pain Score: 0-No pain  Therapy/Group: Individual Therapy  Jaymari Cromie, Selinda Orion 02/06/2015, 12:13 PM

## 2015-02-07 ENCOUNTER — Ambulatory Visit (HOSPITAL_COMMUNITY): Payer: Self-pay | Admitting: Physical Therapy

## 2015-02-07 ENCOUNTER — Encounter (HOSPITAL_COMMUNITY): Payer: Self-pay | Admitting: Speech Pathology

## 2015-02-07 ENCOUNTER — Inpatient Hospital Stay (HOSPITAL_COMMUNITY): Payer: Medicare Other | Admitting: Physical Therapy

## 2015-02-07 ENCOUNTER — Inpatient Hospital Stay (HOSPITAL_COMMUNITY): Payer: Self-pay | Admitting: Occupational Therapy

## 2015-02-07 LAB — GLUCOSE, CAPILLARY
Glucose-Capillary: 100 mg/dL — ABNORMAL HIGH (ref 65–99)
Glucose-Capillary: 105 mg/dL — ABNORMAL HIGH (ref 65–99)

## 2015-02-07 MED ORDER — ATORVASTATIN CALCIUM 40 MG PO TABS: 40.0000 mg | ORAL_TABLET | Freq: Every day | ORAL | Status: DC

## 2015-02-07 MED ORDER — FENOFIBRATE 54 MG PO TABS: 54.0000 mg | ORAL_TABLET | Freq: Every day | ORAL | Status: DC

## 2015-02-07 MED ORDER — INSULIN GLARGINE 100 UNIT/ML ~~LOC~~ SOLN: 30.0000 [IU] | Freq: Every day | SUBCUTANEOUS | Status: DC

## 2015-02-07 MED ORDER — SENNOSIDES-DOCUSATE SODIUM 8.6-50 MG PO TABS
2.0000 | ORAL_TABLET | Freq: Two times a day (BID) | ORAL | Status: DC
  Administered 2015-02-07: 2 via ORAL
  Filled 2015-02-07: qty 2

## 2015-02-07 MED ORDER — FERROUS SULFATE 325 (65 FE) MG PO TABS: 325.0000 mg | ORAL_TABLET | Freq: Two times a day (BID) | ORAL | Status: DC

## 2015-02-07 MED ORDER — CLOPIDOGREL BISULFATE 75 MG PO TABS: 75.0000 mg | ORAL_TABLET | Freq: Every day | ORAL | Status: DC

## 2015-02-07 MED ORDER — GLIPIZIDE 5 MG PO TABS: 5.0000 mg | ORAL_TABLET | Freq: Every day | ORAL | Status: DC

## 2015-02-07 MED ORDER — PANTOPRAZOLE SODIUM 40 MG PO TBEC: 40.0000 mg | DELAYED_RELEASE_TABLET | Freq: Every day | ORAL | Status: DC

## 2015-02-07 MED ORDER — LYRICA 50 MG PO CAPS: 50.0000 mg | ORAL_CAPSULE | Freq: Two times a day (BID) | ORAL | Status: DC

## 2015-02-07 NOTE — Progress Notes (Signed)
Social Work  Discharge Note  The overall goal for the admission was met for:   Discharge location: Yes-HOME WITH FAMILY WHO WILL PROVIDE 24 HR CARE  Length of Stay: Yes-20 DAYS  Discharge activity level: Yes-MIN/MOD LEVEL  Home/community participation: Yes  Services provided included: MD, RD, PT, OT, SLP, RN, CM, TR, Pharmacy and SW  Financial Services: Medicaid and Private Insurance: [facility]  Follow-up services arranged: Home Health: [place] CARE-PT,OT,SP,RN, DME: [place], Other: [facility]UME and Patient/Family request agency HH: PREF [facility] HAD BEFORE, DME: USED BEFORE  Comments (or additional information):FAMILY HERE ON DAY OF DISCHARGE FOR FAMILY TRAINING-REALIZING HOW MUCH CARE THEIR MOM REQUIRES, BUT CONTINUE TO PLAN TO [place]. PT TOLD THEM: " I AM GOING HOME PERIOD."  AWARE IF IT DOES NOT WORK AT HOME HAVE 30 DAYS TO PURSUE NHP. PT HAS [facility] FOR 3 HRS PER DAY. ALSO ON THE CAP WAITING LIST. FAMILY FEELS BETWEEN ALL OF THEM THEY CAN PROVIDE 24 HR CARE. PT IS AT HIGH RISK TO FALL IF SHE TRIES TO GET UP ON HER OWN WITHOUT ASSIT.  Patient/Family verbalized understanding of follow-up arrangements: Yes  Individual responsible for coordination of the follow-up plan: [facility] AND TINA-DAUGHTER  Confirmed correct DME delivered: Elease Hashimoto 02/07/2015    Elease Hashimoto

## 2015-02-07 NOTE — Progress Notes (Signed)
Social Work Patient ID: Michelle Holmes, female   DOB: 11-04-1950, 65 y.o.   MRN: [id number] Numerous family members here to go through therapies with pt and reports were helping prior to admission.

## 2015-02-07 NOTE — Progress Notes (Signed)
Occupational Therapy Discharge Summary  Patient Details  Name: Michelle Holmes MRN: [id number] Date of Birth: 06/17/1950  Patient has met 12 of 13 long term goals due to improved activity tolerance, improved balance, ability to compensate for deficits, functional use of  RIGHT upper and RIGHT lower extremity, improved attention and improved awareness.  Patient to discharge at overall Mod Assist level.  Patient's care partner is independent to provide the necessary physical and cognitive assistance at discharge.  Pt's daughter and godmother present and demonstrated safety with transfers and report able to assist pt with bathing and dressing tasks (as they had already assisted her prior to session).  Reasons goals not met: Pt continues to require mod assist with toilet transfers.  Recommendation:  Patient will benefit from ongoing skilled OT services in home health setting to continue to advance functional skills in the area of BADL and Reduce care partner burden.  Equipment: No equipment provided  Reasons for discharge: treatment goals met and discharge from hospital  Patient/family agrees with progress made and goals achieved: Yes  OT Discharge Precautions/Restrictions  Precautions Precautions: Fall Precaution Comments: R hemiparesis; poor insight into need for tx Restrictions Weight Bearing Restrictions: No Pain Pain Assessment Pain Assessment: No/denies pain Pain Score: 0-No pain ADL  See Function Navigator Vision/Perception  Vision- History Baseline Vision/History: Wears glasses Wears Glasses: Reading only Patient Visual Report: No change from baseline Vision- Assessment Vision Assessment?: No apparent visual deficits  Cognition Overall Cognitive Status: Impaired/Different from baseline Arousal/Alertness: Awake/alert Orientation Level: Oriented X4 Attention: Selective Sustained Attention: Appears intact Selective Attention: Impaired Selective Attention Impairment: Verbal  basic;Functional basic Memory: Impaired Memory Impairment: Decreased short term memory;Decreased recall of new information Awareness: Impaired Awareness Impairment: Intellectual impairment;Emergent impairment Problem Solving: Impaired Problem Solving Impairment: Functional basic;Verbal basic Behaviors: Verbal agitation;Poor frustration tolerance Safety/Judgment: Impaired Sensation Sensation Light Touch: Impaired by gross assessment;Impaired Detail Light Touch Impaired Details: Impaired RLE;Absent RUE Stereognosis: Not tested Hot/Cold: Not tested Proprioception: Impaired Detail Proprioception Impaired Details: Impaired RLE;Absent RUE Coordination Gross Motor Movements are Fluid and Coordinated: No Fine Motor Movements are Fluid and Coordinated: No Finger Nose Finger Test: not tested due to RUE hemiplegia Heel Shin Test: NT due to strength deficits; decreased coordination during functional tasks, gait, transfers 9 Hole Peg Test: not tested due to RUE hemiplegia Motor  Motor Motor: Hemiplegia;Abnormal postural alignment and control;Abnormal tone Motor - Discharge Observations: fluctuating tone RUE, hemiparesis R extremities, R lateral trunk lean in standing, poor balance/righting reactions Mobility  Bed Mobility Bed Mobility: Supine to Sit;Sit to Supine Supine to Sit: 3: Mod assist Supine to Sit Details: Verbal cues for technique;Verbal cues for sequencing;Manual facilitation for placement Sit to Supine: 3: Mod assist Sit to Supine - Details: Verbal cues for sequencing;Verbal cues for precautions/safety;Manual facilitation for placement;Verbal cues for safe use of DME/AE Transfers Sit to Stand: 3: Mod assist;With armrests Sit to Stand Details: Manual facilitation for placement;Verbal cues for precautions/safety;Verbal cues for sequencing;Verbal cues for technique;Manual facilitation for weight shifting Stand to Sit: 3: Mod assist;With armrests Stand to Sit Details (indicate cue  type and reason): Manual facilitation for placement;Verbal cues for safe use of DME/AE;Manual facilitation for weight shifting;Verbal cues for precautions/safety;Verbal cues for technique  Trunk/Postural Assessment  Cervical Assessment Cervical Assessment: Within Functional Limits Thoracic Assessment Thoracic Assessment: Within Functional Limits Lumbar Assessment Lumbar Assessment: Within Functional Limits Postural Control Postural Control: Deficits on evaluation (poor righting responses, decreased initiation, power production, coordination for stepping and balance reactions)  Balance Balance Balance Assessed: Yes  Static Sitting Balance Static Sitting - Balance Support: No upper extremity supported;Feet supported Static Sitting - Level of Assistance: 6: Modified independent (Device/Increase time) Dynamic Sitting Balance Dynamic Sitting - Balance Support: Feet supported;Left upper extremity supported Dynamic Sitting - Level of Assistance: 5: Stand by assistance Dynamic Sitting - Balance Activities: Trunk control activities;Forward lean/weight shifting;Lateral lean/weight shifting Static Standing Balance Static Standing - Balance Support: Left upper extremity supported;During functional activity Static Standing - Level of Assistance: 3: Mod assist Dynamic Standing Balance Dynamic Standing - Balance Support: Left upper extremity supported Dynamic Standing - Level of Assistance: 3: Mod assist Extremity/Trunk Assessment RUE Assessment RUE Assessment: Exceptions to [facility] RUE Tone RUE Tone Comments: Brunnstrom Stage 2 in shoulder, 1-2 in hand LUE Assessment LUE Assessment: Within Functional Limits   See Function Navigator for Current Functional Status.  Michelle Holmes 02/07/2015, 3:26 PM

## 2015-02-07 NOTE — Discharge Instructions (Signed)
Inpatient Rehab Discharge Instructions  Michelle Holmes Discharge date and time: 02/07/14   Activities/Precautions/ Functional Status: Activity: As tolerated Diet: Diabetic diet Wound Care: No current wound care Functional status:  ___ No restrictions     ___ Walk up steps independently ___ 24/7 supervision/assistance   ___ Walk up steps with assistance ___ Intermittent supervision/assistance  ___ Bathe/dress independently ___ Walk with walker     _x__ Bathe/dress with assistance ___ Walk Independently    ___ Shower independently __x_ Walk with assistance    ___ Shower with assistance No alcohol     ___ Return to work/school ________  Special Instructions:     COMMUNITY REFERRALS UPON DISCHARGE:    Home Health:   PT, OT, SP, RN    [place] Date of last service:02/06/2014  Medical Equipment/Items Ordered:WHEELCHAIR  Agency/Supplier:[facility]  [phone] Other:[facility] ON CAP WAITING LIST  GENERAL COMMUNITY RESOURCES FOR PATIENT/FAMILY: Support Groups:[facility]  My questions have been answered and I understand these instructions. I will adhere to these goals and the provided educational materials after my discharge from the hospital.  Patient/Caregiver Signature _______________________________ Date __________  Clinician Signature _______________________________________ Date __________  Please bring this form and your medication list with you to all your follow-up doctor's appointments.

## 2015-02-07 NOTE — Progress Notes (Signed)
Physical Therapy Discharge Summary  Patient Details  Name: Michelle Holmes MRN: [id number] Date of Birth: [birth year]  Today's Date: 02/07/2015 PT Individual Time: 1030-1130 and 1300-1330 PT Individual Time Calculation (min): 60 min and 30 min (total 90 min)    Patient has met 6 of 10 long term goals due to improved activity tolerance, improved balance, improved postural control, increased strength, ability to compensate for deficits, functional use of  right lower extremity, improved attention and improved awareness.  Patient to discharge at a wheelchair level [place].   Patient's care partner is independent to provide the necessary physical and cognitive assistance at discharge. Family education performed on d/c day to due family availability and willingness to participate in education session.   Reasons goals not met: requires modA for transfer w/c <>bed due to low bed height and compliant surface. Requires modA for emergent awareness of deficits. Requires modA for bed mobility due to decreased LE strength, coordination, and core strength. Requires modA for w/c propulsion due to poor motivation to participate, decreased strength and coordination for navigation.   Recommendation:  Patient will benefit from ongoing skilled PT services in home health setting to continue to advance safe functional mobility, address ongoing impairments in strength, coordination, postural control, activity tolerance, and minimize fall risk.  Equipment: w/c with R lap tray, RW  Reasons for discharge: treatment goals met and discharge from hospital  Patient/family agrees with progress made and goals achieved: Yes  PT Discharge Precautions/Restrictions Precautions Precautions: Fall Precaution Comments: R hemiparesis; poor insight into need for tx Restrictions Weight Bearing Restrictions: No Pain Pain Assessment Pain Assessment: No/denies pain Pain Score: 0-No pain Vision/Perception   R inattention Praxis  WFL Cognition Overall Cognitive Status: Impaired/Different from baseline Arousal/Alertness: Awake/alert Orientation Level: Oriented X4 Attention: Selective Sustained Attention: Appears intact Selective Attention: Impaired Selective Attention Impairment: Verbal basic;Functional basic Memory: Impaired Memory Impairment: Decreased short term memory;Decreased recall of new information Awareness: Impaired Awareness Impairment: Intellectual impairment;Emergent impairment Problem Solving: Impaired Problem Solving Impairment: Functional basic;Verbal basic Behaviors: Verbal agitation;Poor frustration tolerance Safety/Judgment: Impaired Sensation Sensation Light Touch: Impaired by gross assessment;Impaired Detail Light Touch Impaired Details: Impaired RLE;Absent RUE Stereognosis: Not tested Hot/Cold: Not tested Proprioception: Impaired Detail Proprioception Impaired Details: Impaired RLE;Absent RUE Coordination Gross Motor Movements are Fluid and Coordinated: No Fine Motor Movements are Fluid and Coordinated: No Finger Nose Finger Test: not tested due to RUE hemiplegia Heel Shin Test: NT due to strength deficits; decreased coordination during functional tasks, gait, transfers 9 Hole Peg Test: not tested due to RUE hemiplegia Motor  Motor Motor: Hemiplegia;Abnormal postural alignment and control;Abnormal tone Motor - Discharge Observations: fluctuating tone RUE, hemiparesis R extremities, R lateral trunk lean in standing, poor balance/righting reactions  Mobility Bed Mobility Bed Mobility: Supine to Sit;Sit to Supine Supine to Sit: 3: Mod assist Supine to Sit Details: Verbal cues for technique;Verbal cues for sequencing;Manual facilitation for placement Sit to Supine: 3: Mod assist Sit to Supine - Details: Verbal cues for sequencing;Verbal cues for precautions/safety;Manual facilitation for placement;Verbal cues for safe use of DME/AE Transfers Transfers: Yes Sit to Stand: 3: Mod  assist;With armrests Sit to Stand Details: Manual facilitation for placement;Verbal cues for precautions/safety;Verbal cues for sequencing;Verbal cues for technique;Manual facilitation for weight shifting Stand to Sit: 3: Mod assist;With armrests Stand to Sit Details (indicate cue type and reason): Manual facilitation for placement;Verbal cues for safe use of DME/AE;Manual facilitation for weight shifting;Verbal cues for precautions/safety;Verbal cues for technique Squat Pivot Transfers: 3: Mod assist Squat Pivot  Transfer Details: Manual facilitation for weight bearing;Manual facilitation for placement;Verbal cues for technique;Verbal cues for sequencing Squat Pivot Transfer Details (indicate cue type and reason): assistant and cueing for foot placement, sequencing Locomotion  Ambulation Ambulation: Yes Ambulation/Gait Assistance: 3: Mod assist Ambulation Distance (Feet): 20 Feet Assistive device: Other (Comment) (L rail in hall) Ambulation/Gait Assistance Details: Verbal cues for gait pattern;Verbal cues for technique;Verbal cues for precautions/safety;Verbal cues for sequencing;Manual facilitation for placement;Manual facilitation for weight bearing;Manual facilitation for weight shifting Gait Gait: Yes Gait Pattern: Impaired Gait Pattern: Lateral hip instability;Poor foot clearance - right;Narrow base of support;Scissoring;Trendelenburg;Decreased weight shift to right;Decreased dorsiflexion - right Gait velocity: decreased for age/gender norms Stairs / Additional Locomotion Stairs: Yes (refused on d/c day; info from previous trials) Stairs Assistance: 2: Max Industrial/product designer Assistance Details: Verbal cues for technique;Verbal cues for precautions/safety;Verbal cues for sequencing;Manual facilitation for placement;Manual facilitation for weight shifting;Manual facilitation for weight bearing Stair Management Technique: One rail Left;Backwards;Forwards;Step to pattern Number of Stairs:  4 Height of Stairs: 3 Wheelchair Mobility Wheelchair Mobility: Yes Wheelchair Assistance: 3: Building surveyor Details: Verbal cues for sequencing;Verbal cues for technique;Manual facilitation for placement;Tactile cues for placement Wheelchair Propulsion: Left upper extremity;Left lower extremity Wheelchair Parts Management: Needs assistance Distance: 50  Trunk/Postural Assessment  Cervical Assessment Cervical Assessment: Within Functional Limits Thoracic Assessment Thoracic Assessment: Within Functional Limits Lumbar Assessment Lumbar Assessment: Within Functional Limits Postural Control Postural Control: Deficits on evaluation (poor righting responses, decreased initiation, power production, coordination for stepping and balance reactions)  Balance Balance Balance Assessed: Yes Static Sitting Balance Static Sitting - Balance Support: No upper extremity supported;Feet supported Static Sitting - Level of Assistance: 6: Modified independent (Device/Increase time) Dynamic Sitting Balance Dynamic Sitting - Balance Support: Feet supported;Left upper extremity supported Dynamic Sitting - Level of Assistance: 5: Stand by assistance Dynamic Sitting - Balance Activities: Trunk control activities;Forward lean/weight shifting;Lateral lean/weight shifting Static Standing Balance Static Standing - Balance Support: Left upper extremity supported;During functional activity Static Standing - Level of Assistance: 3: Mod assist Dynamic Standing Balance Dynamic Standing - Balance Support: Left upper extremity supported Dynamic Standing - Level of Assistance: 3: Mod assist Extremity Assessment  RUE Assessment RUE Assessment: Exceptions to [facility] RUE Tone RUE Tone Comments: Brunnstrom stage 2 in shoulder, 2-1 in hand LUE Assessment LUE Assessment: Within Functional Limits RLE Assessment RLE Assessment: Exceptions to [facility] (hip flexion 3/5, knee extension/flexion 4-/5, ankle  dorsiflexion 1/5) LLE Assessment LLE Assessment: Within Functional Limits (generalized weakness, grossly 4/5 to 4+/5)   Skilled Therapeutic Intervention: Tx 1: Pt received seated in w/c with friend Merlin present; no c/o pain and agreeable to treatment with encouragement from family. Daughter and granddaughter present, however declined hands-on training stating they had performed transfers earlier with OT and felt comfortable. Friend performed all transfers throughout session including w/c <>bed, sit <>supine in bed, and car transfer x2 to low and elevated seat height. Gait x30' with L rail in hallway modA for standing balance and RLE progression. Returned to room totalA and remained seated in w/c with all needs in reach at completion of session. Extensive education provided to family throughout session including safety during transfers, use and safety with AFO, pt to remain at w/c level except for sit <>stand with stable support surface for dressing, and to only perform gait with HHPT. Pt and family agreeable to all the above.   Tx 2: Pt received seated in w/c with family present. Therapist present to assist pt and family with car transfer due to  safety concerns and limited family education leading up to d/c. Upon transporting pt outside with family present, two cars arrived for transport. Family members loaded pt's belongings into first car, with pt reporting she wanted to ride with friend Abagail Kitchens (not present for earlier family education) in pick-up truck. After asking family to pull car forward to allow second car to pull behind, family (daughter, granddaughter, and friend who had participated in earlier training) left in their vehicle, leaving pt with friend [place] who had not performed car transfer. Friend assured therapist that family would be present for transfer out of car at home. Due to safety concerns, this therapist performed transfer with pt w/c >car with modA. Educated friend on w/c storage and  safety during transport.   See Function Navigator for Current Functional Status.  Benjiman Core Tygielski 02/07/2015, 4:23 PM

## 2015-02-07 NOTE — Progress Notes (Signed)
[place] PHYSICAL MEDICINE & REHABILITATION     PROGRESS NOTE    Subjective/Complaints:  Discussed diet- CBG elevated yest pm after cookies   ROS: limited by mental status , denies breathing issues or pain  Objective: Vital Signs: Blood pressure 146/77, pulse 74, temperature 98.3 F (36.8 C), temperature source Oral, resp. rate 17, weight 102.9 kg (226 lb 13.7 oz), SpO2 98 %. No results found.  Recent Labs  02/05/15 0550  WBC 4.1  HGB 9.1*  HCT 27.8*  PLT 173    Recent Labs  02/05/15 0550  NA 147*  K 4.0  CL 118*  GLUCOSE 100*  BUN 39*  CREATININE 3.04*  CALCIUM 9.0   CBG (last 3)   Recent Labs  02/06/15 1641 02/06/15 2050 02/07/15 0709  GLUCAP 174* 178* 100*    Wt Readings from Last 3 Encounters:  02/07/15 102.9 kg (226 lb 13.7 oz)  01/14/15 103.193 kg (227 lb 8 oz)  01/12/15 102.513 kg (226 lb)    Physical Exam:  BP 146/77 mmHg  Pulse 74  Temp(Src) 98.3 F (36.8 C) (Oral)  Resp 17  Wt 102.9 kg (226 lb 13.7 oz)  SpO2 98% Constitutional: NAD. Vital signs reviewed.  HENT: Normocephalic and atraumatic.  Eyes: Conjunctivae and EOM are normal.  Cardiovascular: Normal rate and regular rhythm.  Respiratory: Effort normal and breath sounds normal. No respiratory distress. She has no wheezes.  GI: Soft. Bowel sounds are normal. There is no tenderness.  Musculoskeletal: She exhibits no edema or tenderness.  Neurological: She is alert, confused , not oriented  Right facial weakness with dysarthric speech Exam limited by poor participation.  Motor: RUE 0/5 LUE 5/5 LLE 4+/5 RLE 4 minus/5  Right facial weakness Skin: Skin is warm and dry. No erythema.  Psychiatric: Her affect is blunt. Her speech is delayed. She is slowed. Cognition and memory are impaired. She expresses inappropriate judgment. Oriented to person, place , not time  Assessment/Plan: 1. 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  Function:  Bathing Bathing position Bathing activity did not occur: Refused Position: Production manager parts bathed by patient: Right arm, Chest, Abdomen, Front perineal area, Right upper leg, Left upper leg, Right lower leg, Left lower leg Body parts bathed by helper: Left arm, Back  Bathing assist Assist Level: Touching or steadying assistance(Pt > 75%)      Upper Body Dressing/Undressing Upper body dressing Upper body dressing/undressing activity did not occur: Refused What is the patient wearing?: Pull over shirt/dress     Pull over shirt/dress - Perfomed by patient: Thread/unthread right sleeve, Thread/unthread left sleeve, Put head through opening, Pull shirt over trunk Pull over shirt/dress - Perfomed by helper: Thread/unthread right sleeve        Upper body assist Assist Level: Supervision or verbal cues      Lower Body Dressing/Undressing Lower body dressing Lower body dressing/undressing activity did not occur: Refused What is the patient wearing?: Pants, Socks, Shoes, AFO     Pants- Performed by patient: Thread/unthread left pants leg Pants- Performed by helper: Thread/unthread right pants leg, Pull pants up/down   Non-skid slipper socks- Performed by helper: Don/doff right sock, Don/doff left sock   Socks - Performed by helper: Don/doff right sock, Don/doff left sock   Shoes - Performed by helper: Don/doff right shoe, Don/doff left shoe  AFO - Performed by helper: Don/doff right AFO      Lower body assist Assist for lower body dressing: Touching or steadying assistance (Pt > 75%)      Toileting Toileting Toileting activity did not occur: N/A Toileting steps completed by patient: Adjust clothing prior to toileting, Performs perineal hygiene Toileting steps completed by helper: Performs perineal hygiene, Adjust clothing prior to toileting, Adjust  clothing after toileting Toileting Assistive Devices: Grab bar or rail  Toileting assist Assist level: Touching or steadying assistance (Pt.75%)   Transfers Chair/bed transfer   Chair/bed transfer method: Squat pivot Chair/bed transfer assist level: Moderate assist (Pt 50 - 74%/lift or lower) Chair/bed transfer assistive device: Armrests Mechanical lift: Stedy   Locomotion Ambulation Ambulation activity did not occur: Refused   Max distance: 20 Assist level: Moderate assist (Pt 50 - 74%)   Wheelchair Wheelchair activity did not occur: Refused Type: Manual Max wheelchair distance: 30 Assist Level: Moderate assistance (Pt 50 - 74%)  Cognition Comprehension Comprehension assist level: Follows basic conversation/direction with extra time/assistive device  Expression Expression assist level: Expresses basic 90% of the time/requires cueing < 10% of the time.  Social Interaction Social Interaction assist level: Interacts appropriately 75 - 89% of the time - Needs redirection for appropriate language or to initiate interaction.  Problem Solving Problem solving assist level: Solves basic 50 - 74% of the time/requires cueing 25 - 49% of the time  Memory Memory assist level: Recognizes or recalls 50 - 74% of the time/requires cueing 25 - 49% of the time    Medical Problem List and Plan: 1. Dysphagia, Aphasia,right  hemiparesis secondary to Left subthalamic lacunar infarct, D/c today 2. DVT Prophylaxis/Anticoagulation: Pharmaceutical: Lovenox-borderline low plt monitor 3. Chronic back pain/ Pain Management:lumbar stenosis and scoliosis , severe, No Pain c/o  On lyrica bid. Off OxyContin and ultram  Will usetylenol prn to prevent sedation.  4. Mood: LCSW to follow for evaluation and support.  5. Neuropsych: This patient is not capable of making decisions on her own behalf.Pt states she needs to pay bills, will ask SW to coordinate family to help with this    Has stroke related cognitive  deficits, can direct who is her POA but not manage own funds                                                                                                                                                                                                              6. Skin/Wound Care: Maintain adequate nutrition and hydration status. Routine pressure  relief measures.  7. Fluids/Electrolytes/Nutrition: Monitor I/O. Offer supplements if intake poor.  8. COPD: Continue IS. On dulera bid.  9. OSA/OHS: Question use of CPAP at bedtime 10. Dyslipidemia: Continue Lipitor and fenofibrate. 11.DM type 2: Monitor BS ac/hs.  , CBGs improving on glipizide 5mg  qam and Lantus 30U -looks well controlled currently CBG (last 3)   Recent Labs  02/06/15 1641 02/06/15 2050 02/07/15 0709  GLUCAP 174* 178* 100*    12. 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 Encourage po intake.   Cr. 3.04 on 1/10.close to baseline 14. Anemia  Hb 9.6 on 1/4- awaiting stool guaic but no BM x several days 15. Thrombocytopenia-Mild may be lovenox related,   Plts 149 on 1/4-CBC- CBC pending 16.  Constipation  Change  senna S to 2 tabs , LOS (Days) 20 A FACE TO FACE EVALUATION WAS PERFORMED  Charlett Blake 02/07/2015 7:33 AM

## 2015-02-07 NOTE — Progress Notes (Signed)
Speech Language Pathology Discharge Summary  Patient Details  Name: Michelle Holmes MRN: [id number] Date of Birth: Feb 12, 1950  Today's Date: 02/07/2015 SLP Individual Time: 1130-1200 SLP Individual Time Calculation (min): 30 min   Skilled Therapeutic Interventions:  Pt was seen for skilled ST targeting family education.  Pt's daughter, granddaughter, and family friend were present.  SLP provided education regarding rationale behind ST follow up while inpatient including current goals and progress in therapies.   Skilled instruction was provided regarding techniques for distraction management and to facilitate recall of daily information with handouts provided to maximize carryover in the home environment.  SLP also provided a handout of cognitive reorganization activities to intensify treatment effects in between therapy sessions in the home environment.  SLP recommended that pt have close 24/7 supervision at home in addition to assistance for medication and financial management and ST follow up at next level of care.  All questions were answered to pt's and family's satisfaction at this time, although recommend ongoing education at next venue of care given the extent of pt's cognitive deficits and limited family participation in therapies while inpatient.  Pt left in wheelchair with family present.      Patient has met 5 of 5 long term goals.  Patient to discharge at overall Mod level.  Reasons goals not met:     Clinical Impression/Discharge Summary:  Pt made gains while inpatient and is discharging having met 5 out of 5 short term goals.  Pt currently requires mod assist for basic cognitive tasks due to decreased sustained and selective to tasks, decreased recall of daily information, decreased functional problem solving, and poor intellectual awareness of deficits.  Pt's diet has been upgraded to regular textures and thin liquids although pt continues to require full supervision during meals due to  cognitive deficits.  Pt is discharging home with 24/7 supervision from family with recommendations for additional ST follow up at next level of care.  Pt and family education was completed at the inpatient level but they would continue to benefit from further education to maximize functional independence and reduce burden of care in the home environment.    Care Partner:  Caregiver Able to Provide Assistance: Yes  Type of Caregiver Assistance: Physical;Cognitive  Recommendation:  24 hour supervision/assistance;Home Health SLP;Outpatient SLP  Rationale for SLP Follow Up: Maximize cognitive function and independence;Maximize functional communication;Reduce caregiver burden   Equipment: noen recommended by SLP    Reasons for discharge: Discharged from hospital   Patient/Family Agrees with Progress Made and Goals Achieved: Yes   Function:  Eating Eating                 Cognition Comprehension Comprehension assist level: Follows basic conversation/direction with extra time/assistive device  Expression   Expression assist level: Expresses basic 90% of the time/requires cueing < 10% of the time.  Social Interaction Social Interaction assist level: Interacts appropriately 75 - 89% of the time - Needs redirection for appropriate language or to initiate interaction.  Problem Solving Problem solving assist level: Solves basic 50 - 74% of the time/requires cueing 25 - 49% of the time  Memory Memory assist level: Recognizes or recalls 50 - 74% of the time/requires cueing 25 - 49% of the time   Emilio Math 02/07/2015, 3:51 PM

## 2015-02-07 NOTE — Progress Notes (Signed)
Patient discharged home.  Left floor via wheelchair, escorted by therapy staff and family.  Patient and family verbalized understanding of discharge instructions as given by Marlowe Shores, PA.  All belongings sent with patient including DME.  Patient appears to be in no immediate distress at this time.  Brita Romp, RN

## 2015-02-07 NOTE — Progress Notes (Signed)
Occupational Therapy Session Note  Patient Details  Name: Michelle Holmes MRN: [id number] Date of Birth: 28-Nov-1950  Today's Date: 02/07/2015 OT Individual Time: 0900-1020 OT Individual Time Calculation (min): 80 min    Short Term Goals: Week 3:  OT Short Term Goal 1 (Week 3): Pt will perform squat pivot transfers consistently with mod A to toilet/ BSC OT Short Term Goal 2 (Week 3): Pt will don LB clothing with mod A sit to stand OT Short Term Goal 3 (Week 3): Pt will perform 3/3 grooming tasks with min A sitting at the sink OT Short Term Goal 4 (Week 3): Pt will perform bed mobility with min A with extra time without bed rails and flat bed  Skilled Therapeutic Interventions/Progress Updates:    Treatment session with focus on hands on family education and RUE NMR.  Pt already dressed upon arrival with pt's daughter, [name], and godmother reporting they had assisted with dressing her.  Discussed OT purpose and goals, with family reporting Min assist with UB dressing and total assist LB dressing.  Educated on hemi-dressing technique to increase pt participation with self-care tasks.  Completed tub/shower and toilet transfers in ADL apt with daughter, [name], completing each transfer without additional cues after demonstration of proper technique from therapist.  Pt's family reports that they had been assisting her before and were prepared to continue to assist her at current level.  Discussed use of BSC next to bed at night time and when pt lower level, as bathroom is tight and difficulty positioning w/c next to toilet.  Provided pt and family with PROM and self-ROM exercises and encouraged increased mobility of RUE to promote motor recovery.  All family members report understanding and no further questions.    Therapy Documentation Precautions:  Precautions Precautions: Fall Precaution Comments: R hemiparesis; poor insight into need for tx Restrictions Weight Bearing Restrictions: No General:    Vital Signs: Therapy Vitals Temp: 98.3 F (36.8 C) Temp Source: Oral Pulse Rate: 74 BP: (!) 146/77 mmHg Patient Position (if appropriate): Lying Oxygen Therapy SpO2: 98 % O2 Device: Not Delivered Pain: Pain Assessment Pain Assessment: No/denies pain Pain Score: 0-No pain  See Function Navigator for Current Functional Status.   Therapy/Group: Individual Therapy  [name] 02/07/2015, 10:32 AM

## 2015-02-08 ENCOUNTER — Telehealth: Payer: Self-pay | Admitting: *Deleted

## 2015-02-08 NOTE — Telephone Encounter (Signed)
Michelle Holmes says they need something else on form they faxed over (date of last office visit)  Michelle Holmes is refaxing form.  No office visit yet, last seen at d/c from hospital 02/07/15

## 2015-02-08 NOTE — Discharge Summary (Signed)
Physician Discharge Summary  Patient ID: Michelle Holmes MRN: [id number] DOB/AGE: 07-07-1950 65 y.o.  Admit date: 01/18/2015 Discharge date: 02/07/2015  Discharge Diagnoses:  Principal Problem:   Acute ischemic VBA thalamic stroke [facility]) Active Problems:   Sleep apnea   Morbid obesity (HCC)   CHF (congestive heart failure) (HCC)   Chronic renal failure   CAD (coronary artery disease)   Right hemiplegia (HCC)   DM type 2 with diabetic peripheral neuropathy (HCC)   Chronic obstructive pulmonary disease (HCC)   Hemiparesis, aphasia, and dysphagia as late effect of cerebrovascular accident (CVA) ([place])   Thrombocytopenia ([place])   Discharged Condition: stable   Labs:  Basic Metabolic Panel: BMP Latest Ref Rng 02/05/2015 02/01/2015 01/30/2015  Glucose 65 - 99 mg/dL 100(H) - 94  BUN 6 - 20 mg/dL 39(H) - 40(H)  Creatinine 0.44 - 1.00 mg/dL 3.04(H) 3.19(H) 2.90(H)  Sodium 135 - 145 mmol/L 147(H) - 147(H)  Potassium 3.5 - 5.1 mmol/L 4.0 - 4.3  Chloride 101 - 111 mmol/L 118(H) - 115(H)  CO2 22 - 32 mmol/L 22 - 22  Calcium 8.9 - 10.3 mg/dL 9.0 - 9.7     CBC:  Recent Labs Lab 02/05/15 0550  WBC 4.1  HGB 9.1*  HCT 27.8*  MCV 96.2  PLT 173    CBG:  Recent Labs Lab 02/06/15 1131 02/06/15 1641 02/06/15 2050 02/07/15 0709 02/07/15 1127  GLUCAP 114* 174* 178* 100* 105*    Brief HPI:   Michelle Holmes is an 65 year old right handed female with history of HTN, chronic diastolic CHF, COPD, OAS/OHS--oxygen dependent, CAD, CVA with residual right hemiparesis and dysarthria who was admitted on 01/13/15 with worsening of right sided weakness and elevated BS- 504. MRI brain done revealing acute infarct left thalamus and left cerebral peduncle adjacent to limb of left internal capsul and tiny acute ischemic infarct right basal ganglia. Dr. Leonie Man felt that patient had left subcortical infarcts due to small vessel disease with fluctuating right hemiplegia and to continue on ASA/Plavix for secondary  stroke prevention.  Patient with resultant dysarthria, right sided weakness, dysphagia and fluctuating mental status.  CIR was recommended follow up therapy.    Hospital Course: Michelle Holmes was admitted to rehab 01/18/2015 for inpatient therapies to consist of PT, ST and OT at least three hours five days a week. Past admission physiatrist, therapy team and rehab RN have worked together to provide customized collaborative inpatient rehab. She has been tolerating ASA/Plavix without side effects. Pain has been controlled on lyrica bid with tylenol as needed. Blood pressures were monitored on bid basis and has been controlled off medications. Diabetes was monitored on ac/hs basis and blood sugars were slowly improving. Renal status has been monitored with serial check and creatinine has stabilized to 3.04.  CBC shows H/H to be stable but she had drop in platelets to 127. Lovenox was discontinued with resolution of thrombocytopenia.    She had demonstrated variable participation requiring much encouragement to participate in therapy. She continued to have poor emergent awareness of deficits.  Cognitive evaluation by Dr. Pollard/neuropsychologist revealed marked cognitive disruption at level of dementia. No evidence of significant depression or anxiety noted or reported and poor participation was felt to be due to frustration from hospitalization. Family was educated on all aspects of care as well as safety. Patient declined SNF and family was confident about providing care needed. She is currently at min to moderate assist level and will continue to receive follow up HHPT,  HHOT , HHST and [place] by [place] after discharge.  Constipation was managed with use of SMOG enema and Senna S was added to help with chronic issues.  She has been educated on appropriate diet and    Rehab course: During patient's stay in rehab weekly team conferences were held to monitor patient's progress, set goals and discuss  barriers to discharge. Patient has had improvement in activity tolerance, balance, postural control, as well as ability to compensate for deficits. She is has had improvement in functional use RUE/LUE  and RLE/LLE as well as improved awareness She requires moderate assistance for transfers and wheelchair propulsion.  She requires moderate assistance with basic cognitive tasks and is tolerating regular diet with supervision during meals due to cognitive deficits.  Family education was done regarding assistance needed as well as safety.     Disposition: 01-Home or Self Care  Diet:  Diabetic diet.   Special Instructions:      Discharge Instructions    Ambulatory referral to Physical Medicine Rehab    Complete by:  As directed   Follow-up 4-6 weeks for left subthalamic lacunar infarct            Medication List    STOP taking these medications        feeding supplement (ENSURE ENLIVE) Liqd      TAKE these medications        ADVAIR DISKUS 250-50 MCG/DOSE Aepb  Generic drug:  Fluticasone-Salmeterol  Inhale 1 puff into the lungs 2 (two) times daily.     aspirin 81 MG EC tablet  Take 1 tablet (81 mg total) by mouth daily.     atorvastatin 40 MG tablet  Commonly known as:  LIPITOR  Take 1 tablet (40 mg total) by mouth daily.     clopidogrel 75 MG tablet  Commonly known as:  PLAVIX  Take 1 tablet (75 mg total) by mouth daily.     fenofibrate 54 MG tablet  Take 1 tablet (54 mg total) by mouth daily.     ferrous sulfate 325 (65 FE) MG tablet  Take 1 tablet (325 mg total) by mouth 2 (two) times daily with a meal.     glipiZIDE 5 MG tablet  Commonly known as:  GLUCOTROL  Take 1 tablet (5 mg total) by mouth daily before breakfast.     insulin glargine 100 UNIT/ML injection  Commonly known as:  LANTUS  Inject 0.3 mLs (30 Units total) into the skin at bedtime.     LYRICA 50 MG capsule  Generic drug:  pregabalin  Take 1 capsule (50 mg total) by mouth 2 (two) times daily.      pantoprazole 40 MG tablet  Commonly known as:  PROTONIX  Take 1 tablet (40 mg total) by mouth daily.       Follow-up Information    Follow up with Charlett Blake, MD.   Specialty:  Physical Medicine and Rehabilitation   Why:  Office to call for appointment   Contact information:   [place] [address] [phone]       Follow up with Antony Contras, MD.   Specialties:  Neurology, Radiology   Why:  Call for appointment 1 month   Contact information:   [address] [place] [address] [phone]       Follow up with Charolette Forward, MD On 02/14/2015.   Specialty:  Cardiology   Why:  appt @ 3:00 PM  Contact information:   [address] [place] [address] [phone]       Signed: Bary Leriche 02/08/2015, 12:37 PM

## 2015-02-08 NOTE — Telephone Encounter (Signed)
Charlesetta Ivory called and there is missing information on order form received from hospital discharge. I spoke with Charlesetta Ivory at [facility] and asked her to fax form to our office attn Johnette.

## 2015-02-14 ENCOUNTER — Other Ambulatory Visit: Payer: Self-pay | Admitting: Physical Medicine & Rehabilitation

## 2015-02-20 ENCOUNTER — Telehealth: Payer: Self-pay | Admitting: Physical Medicine & Rehabilitation

## 2015-02-20 NOTE — Telephone Encounter (Signed)
Wayne Both RN with [facility] needs verbal order for 2w4 then 1w3.  Please call her at [phone].

## 2015-02-21 NOTE — Telephone Encounter (Signed)
Spoke with Wayne Both. Verbal orders approved for OT.

## 2015-03-01 DIAGNOSIS — I69322 Dysarthria following cerebral infarction: Secondary | ICD-10-CM | POA: Diagnosis not present

## 2015-03-01 DIAGNOSIS — I69351 Hemiplegia and hemiparesis following cerebral infarction affecting right dominant side: Secondary | ICD-10-CM | POA: Diagnosis not present

## 2015-03-01 DIAGNOSIS — I129 Hypertensive chronic kidney disease with stage 1 through stage 4 chronic kidney disease, or unspecified chronic kidney disease: Secondary | ICD-10-CM | POA: Diagnosis not present

## 2015-03-01 DIAGNOSIS — N184 Chronic kidney disease, stage 4 (severe): Secondary | ICD-10-CM | POA: Diagnosis not present

## 2015-03-13 ENCOUNTER — Telehealth: Payer: Self-pay

## 2015-03-13 DIAGNOSIS — I63212 Cerebral infarction due to unspecified occlusion or stenosis of left vertebral arteries: Secondary | ICD-10-CM

## 2015-03-13 DIAGNOSIS — I6381 Other cerebral infarction due to occlusion or stenosis of small artery: Secondary | ICD-10-CM

## 2015-03-13 DIAGNOSIS — I6322 Cerebral infarction due to unspecified occlusion or stenosis of basilar arteries: Principal | ICD-10-CM

## 2015-03-13 NOTE — Telephone Encounter (Signed)
Merry Proud- Therapist from [facility]- is requesting a social work consult and to transition to outpt PT. He can be reached at [phone]. Verbal orders approved.

## 2015-03-15 ENCOUNTER — Encounter: Payer: Medicare Other | Attending: Physical Medicine & Rehabilitation

## 2015-03-15 ENCOUNTER — Telehealth: Payer: Self-pay | Admitting: *Deleted

## 2015-03-15 ENCOUNTER — Inpatient Hospital Stay: Payer: Medicare Other | Admitting: Physical Medicine & Rehabilitation

## 2015-03-15 NOTE — Telephone Encounter (Signed)
[facility] policy requires them tho notify MD when pt misses a visit and Ms Overfield missed an OT visit this week.  FYI

## 2015-03-21 ENCOUNTER — Emergency Department (HOSPITAL_COMMUNITY)
Admission: EM | Admit: 2015-03-21 | Discharge: 2015-03-21 | Disposition: A | Discharge status: Home / Self Care | Payer: Medicare Other | Attending: Emergency Medicine | Admitting: Emergency Medicine

## 2015-03-21 ENCOUNTER — Emergency Department (HOSPITAL_COMMUNITY): Payer: Medicare Other

## 2015-03-21 DIAGNOSIS — Z9861 Coronary angioplasty status: Secondary | ICD-10-CM | POA: Diagnosis not present

## 2015-03-21 DIAGNOSIS — M419 Scoliosis, unspecified: Secondary | ICD-10-CM | POA: Diagnosis not present

## 2015-03-21 DIAGNOSIS — J439 Emphysema, unspecified: Secondary | ICD-10-CM | POA: Insufficient documentation

## 2015-03-21 DIAGNOSIS — N201 Calculus of ureter: Secondary | ICD-10-CM | POA: Insufficient documentation

## 2015-03-21 DIAGNOSIS — Z8673 Personal history of transient ischemic attack (TIA), and cerebral infarction without residual deficits: Secondary | ICD-10-CM | POA: Diagnosis not present

## 2015-03-21 DIAGNOSIS — Z79899 Other long term (current) drug therapy: Secondary | ICD-10-CM | POA: Diagnosis not present

## 2015-03-21 DIAGNOSIS — Z7902 Long term (current) use of antithrombotics/antiplatelets: Secondary | ICD-10-CM | POA: Diagnosis not present

## 2015-03-21 DIAGNOSIS — I10 Essential (primary) hypertension: Secondary | ICD-10-CM | POA: Diagnosis not present

## 2015-03-21 DIAGNOSIS — Z85038 Personal history of other malignant neoplasm of large intestine: Secondary | ICD-10-CM | POA: Insufficient documentation

## 2015-03-21 DIAGNOSIS — I509 Heart failure, unspecified: Secondary | ICD-10-CM | POA: Diagnosis not present

## 2015-03-21 DIAGNOSIS — E7800 Pure hypercholesterolemia, unspecified: Secondary | ICD-10-CM | POA: Insufficient documentation

## 2015-03-21 DIAGNOSIS — E669 Obesity, unspecified: Secondary | ICD-10-CM | POA: Diagnosis not present

## 2015-03-21 DIAGNOSIS — Z7984 Long term (current) use of oral hypoglycemic drugs: Secondary | ICD-10-CM | POA: Insufficient documentation

## 2015-03-21 DIAGNOSIS — G8929 Other chronic pain: Secondary | ICD-10-CM | POA: Diagnosis not present

## 2015-03-21 DIAGNOSIS — Z794 Long term (current) use of insulin: Secondary | ICD-10-CM | POA: Insufficient documentation

## 2015-03-21 DIAGNOSIS — Z7982 Long term (current) use of aspirin: Secondary | ICD-10-CM | POA: Insufficient documentation

## 2015-03-21 DIAGNOSIS — I2510 Atherosclerotic heart disease of native coronary artery without angina pectoris: Secondary | ICD-10-CM | POA: Diagnosis not present

## 2015-03-21 DIAGNOSIS — Z87891 Personal history of nicotine dependence: Secondary | ICD-10-CM | POA: Diagnosis not present

## 2015-03-21 DIAGNOSIS — R109 Unspecified abdominal pain: Secondary | ICD-10-CM

## 2015-03-21 DIAGNOSIS — M1990 Unspecified osteoarthritis, unspecified site: Secondary | ICD-10-CM | POA: Diagnosis not present

## 2015-03-21 DIAGNOSIS — D649 Anemia, unspecified: Secondary | ICD-10-CM | POA: Insufficient documentation

## 2015-03-21 DIAGNOSIS — N390 Urinary tract infection, site not specified: Secondary | ICD-10-CM | POA: Insufficient documentation

## 2015-03-21 DIAGNOSIS — I252 Old myocardial infarction: Secondary | ICD-10-CM | POA: Insufficient documentation

## 2015-03-21 DIAGNOSIS — E119 Type 2 diabetes mellitus without complications: Secondary | ICD-10-CM | POA: Diagnosis not present

## 2015-03-21 DIAGNOSIS — Z880 Allergy status to penicillin: Secondary | ICD-10-CM | POA: Diagnosis not present

## 2015-03-21 LAB — URINE MICROSCOPIC-ADD ON

## 2015-03-21 LAB — CBC
HCT: 33.3 % — ABNORMAL LOW (ref 36.0–46.0)
Hemoglobin: 11.2 g/dL — ABNORMAL LOW (ref 12.0–15.0)
MCH: 30.8 pg (ref 26.0–34.0)
MCHC: 33.6 g/dL (ref 30.0–36.0)
MCV: 91.5 fL (ref 78.0–100.0)
Platelets: 221 10*3/uL (ref 150–400)
RBC: 3.64 MIL/uL — ABNORMAL LOW (ref 3.87–5.11)
RDW: 13.7 % (ref 11.5–15.5)
WBC: 7.7 10*3/uL (ref 4.0–10.5)

## 2015-03-21 LAB — COMPREHENSIVE METABOLIC PANEL
ALT: 16 U/L (ref 14–54)
AST: 18 U/L (ref 15–41)
Albumin: 4.4 g/dL (ref 3.5–5.0)
Alkaline Phosphatase: 64 U/L (ref 38–126)
Anion gap: 13 (ref 5–15)
BUN: 26 mg/dL — ABNORMAL HIGH (ref 6–20)
CO2: 20 mmol/L — ABNORMAL LOW (ref 22–32)
Calcium: 9.8 mg/dL (ref 8.9–10.3)
Chloride: 112 mmol/L — ABNORMAL HIGH (ref 101–111)
Creatinine, Ser: 2.9 mg/dL — ABNORMAL HIGH (ref 0.44–1.00)
GFR calc Af Amer: 19 mL/min — ABNORMAL LOW (ref >60)
GFR calc non Af Amer: 16 mL/min — ABNORMAL LOW (ref >60)
Glucose, Bld: 226 mg/dL — ABNORMAL HIGH (ref 65–99)
Potassium: 4.1 mmol/L (ref 3.5–5.1)
Sodium: 145 mmol/L (ref 135–145)
Total Bilirubin: 0.7 mg/dL (ref 0.3–1.2)
Total Protein: 7.6 g/dL (ref 6.5–8.1)

## 2015-03-21 LAB — LIPASE, BLOOD: Lipase: 47 U/L (ref 11–51)

## 2015-03-21 LAB — URINALYSIS, ROUTINE W REFLEX MICROSCOPIC
Bilirubin Urine: NEGATIVE
Glucose, UA: NEGATIVE mg/dL
Ketones, ur: NEGATIVE mg/dL
Nitrite: NEGATIVE
Protein, ur: 30 mg/dL — AB
Specific Gravity, Urine: 1.017 (ref 1.005–1.030)
pH: 5 (ref 5.0–8.0)

## 2015-03-21 MED ORDER — MORPHINE SULFATE (PF) 4 MG/ML IV SOLN
6.0000 mg | Freq: Once | INTRAVENOUS | Status: AC
Start: 2015-03-21 — End: 2015-03-21
  Administered 2015-03-21: 6 mg via INTRAVENOUS
  Filled 2015-03-21: qty 2

## 2015-03-21 MED ORDER — SODIUM CHLORIDE 0.9 % IV SOLN
INTRAVENOUS | Status: DC
  Administered 2015-03-21 (×2): via INTRAVENOUS

## 2015-03-21 MED ORDER — OXYCODONE-ACETAMINOPHEN 5-325 MG PO TABS: 1.0000 | ORAL_TABLET | ORAL | Status: DC | PRN

## 2015-03-21 MED ORDER — IOHEXOL 300 MG/ML  SOLN
50.0000 mL | Freq: Once | INTRAMUSCULAR | Status: AC | PRN
  Administered 2015-03-21: 50 mL via ORAL

## 2015-03-21 MED ORDER — ONDANSETRON HCL 4 MG PO TABS: 4.0000 mg | ORAL_TABLET | Freq: Four times a day (QID) | ORAL | Status: DC

## 2015-03-21 MED ORDER — ONDANSETRON HCL 4 MG/2ML IJ SOLN
4.0000 mg | Freq: Once | INTRAMUSCULAR | Status: AC
  Administered 2015-03-21: 4 mg via INTRAVENOUS
  Filled 2015-03-21: qty 2

## 2015-03-21 MED ORDER — CIPROFLOXACIN IN D5W 400 MG/200ML IV SOLN
400.0000 mg | Freq: Once | INTRAVENOUS | Status: AC
  Administered 2015-03-21: 400 mg via INTRAVENOUS
  Filled 2015-03-21: qty 200

## 2015-03-21 MED ORDER — OXYCODONE-ACETAMINOPHEN 5-325 MG PO TABS
1.0000 | ORAL_TABLET | Freq: Once | ORAL | Status: AC
  Administered 2015-03-21: 1 via ORAL
  Filled 2015-03-21: qty 1

## 2015-03-21 NOTE — ED Notes (Signed)
Pt place on 2 L because her sats dropped to the 60s. She states that she wears 2 L at home when she sleeps.

## 2015-03-21 NOTE — ED Notes (Signed)
Bed: WA06 Expected date:  Expected time:  Means of arrival:  Comments: EMS 65 yo female RUQ abdominal pain, nausea and vomiting since 1700

## 2015-03-21 NOTE — ED Notes (Signed)
Pt transported to CT ?

## 2015-03-21 NOTE — ED Provider Notes (Signed)
CSN: [id number]     Arrival date & time 03/21/15  0210 History  By signing my name below, I, [name], attest that this documentation has been prepared under the direction and in the presence of Virgel Manifold, MD. Electronically Signed: [name], ED Scribe. 03/21/2015. 2:45 AM   Chief Complaint  Patient presents with  . Abdominal Pain    The history is provided by the patient and a relative. No language interpreter was used.   Brought in by EMS, Michelle Holmes is a 65 y.o. female with history of colon cancer who presents to the Emergency Department complaining of new, constant, waxing and waning, heavy, mid abdominal pain with onset approximately 7 hours ago. Associated symptoms include nausea and vomiting. Pt denies fever, difficulty urinating, dysuria, diarrhea, and SOB.   Past Medical History  Diagnosis Date  . Emphysema   . Colon cancer ([place])   . Hypertension   . Hypercholesteremia   . Diabetes mellitus   . COPD (chronic obstructive pulmonary disease) ([place])   . Coronary artery disease   . Obesity   . Sleep apnea   . TIA (transient ischemic attack)   . CHF (congestive heart failure) ([place])   . Anemia   . DJD (degenerative joint disease)   . Renal insufficiency   . Chronic back pain   . Scoliosis   . Obesity hypoventilation syndrome ([place])   . Stroke ([place])   . Myocardial infarction [facility])    Past Surgical History  Procedure Laterality Date  . Coronary angioplasty with stent placement    . Colon surgery     No family history on file. Social History  Substance Use Topics  . Smoking status: Former Research scientist (life sciences)  . Smokeless tobacco: Not on file  . Alcohol Use: No   OB History    No data available     Review of Systems  Constitutional: Negative for fever.  Respiratory: Negative for shortness of breath.   Gastrointestinal: Positive for nausea, vomiting and abdominal pain. Negative for diarrhea.  Genitourinary: Negative for dysuria and difficulty urinating.  All  other systems reviewed and are negative.  Allergies  Penicillins  Home Medications   Prior to Admission medications   Medication Sig Start Date End Date Taking? Authorizing Provider  ADVAIR DISKUS 250-50 MCG/DOSE AEPB Inhale 1 puff into the lungs 2 (two) times daily.  08/22/14   Historical Provider, MD  aspirin EC 81 MG EC tablet Take 1 tablet (81 mg total) by mouth daily. 06/22/14   Charolette Forward, MD  atorvastatin (LIPITOR) 40 MG tablet Take 1 tablet (40 mg total) by mouth daily. 02/07/15   Lavon Paganini Angiulli, PA-C  clopidogrel (PLAVIX) 75 MG tablet Take 1 tablet (75 mg total) by mouth daily. 02/07/15   Lavon Paganini Angiulli, PA-C  fenofibrate 54 MG tablet Take 1 tablet (54 mg total) by mouth daily. 02/07/15   Lavon Paganini Angiulli, PA-C  ferrous sulfate 325 (65 FE) MG tablet Take 1 tablet (325 mg total) by mouth 2 (two) times daily with a meal. 02/07/15   Lavon Paganini Angiulli, PA-C  glipiZIDE (GLUCOTROL) 5 MG tablet Take 1 tablet (5 mg total) by mouth daily before breakfast. 02/07/15   Lavon Paganini Angiulli, PA-C  insulin glargine (LANTUS) 100 UNIT/ML injection Inject 0.3 mLs (30 Units total) into the skin at bedtime. 02/07/15   Daniel J Angiulli, PA-C  LYRICA 50 MG capsule Take 1 capsule (50 mg total) by mouth 2 (two) times daily. 02/07/15   Lavon Paganini  Angiulli, PA-C  pantoprazole (PROTONIX) 40 MG tablet Take 1 tablet (40 mg total) by mouth daily. 02/07/15   Daniel J Angiulli, PA-C   BP 185/84 mmHg  Pulse 75  Temp(Src) 98 F (36.7 C) (Oral)  Resp 20  SpO2 97% Physical Exam  Constitutional: She is oriented to person, place, and time. She appears well-developed and well-nourished. No distress.  HENT:  Head: Normocephalic and atraumatic.  Eyes: EOM are normal.  Neck: Normal range of motion.  Cardiovascular: Normal rate, regular rhythm and normal heart sounds.   Pulmonary/Chest: Effort normal and breath sounds normal.  Abdominal: Soft. She exhibits no distension. There is tenderness. There is no rebound and no  guarding.  Periumbilical and lower abdominal tenderness  Neurological: She is alert and oriented to person, place, and time.  Skin: Skin is warm and dry.  Psychiatric: She has a normal mood and affect. Judgment normal.  Nursing note and vitals reviewed.   ED Course  Procedures (including critical care time) DIAGNOSTIC STUDIES: Oxygen Saturation is 97% on RA,  normal by my interpretation.    COORDINATION OF CARE: 2:42 AM Discussed treatment plan which includes lab work with pt and daughter at bedside and they agreed to plan.  Labs Review Labs Reviewed  COMPREHENSIVE METABOLIC PANEL - Abnormal; Notable for the following:    Chloride 112 (*)    CO2 20 (*)    Glucose, Bld 226 (*)    BUN 26 (*)    Creatinine, Ser 2.90 (*)    GFR calc non Af Amer 16 (*)    GFR calc Af Amer 19 (*)    All other components within normal limits  CBC - Abnormal; Notable for the following:    RBC 3.64 (*)    Hemoglobin 11.2 (*)    HCT 33.3 (*)    All other components within normal limits  URINALYSIS, ROUTINE W REFLEX MICROSCOPIC (NOT AT [facility]) - Abnormal; Notable for the following:    APPearance TURBID (*)    Hgb urine dipstick LARGE (*)    Protein, ur 30 (*)    Leukocytes, UA LARGE (*)    All other components within normal limits  URINE MICROSCOPIC-ADD ON - Abnormal; Notable for the following:    Squamous Epithelial / LPF 0-5 (*)    Bacteria, UA MANY (*)    All other components within normal limits  URINE CULTURE  LIPASE, BLOOD    Imaging Review No results found.   Ct Abdomen Pelvis Wo Contrast  03/21/2015  CLINICAL DATA:  Initial evaluation for acute mid abdominal pain, nausea, vomiting. EXAM: CT ABDOMEN AND PELVIS WITHOUT CONTRAST TECHNIQUE: Multidetector CT imaging of the abdomen and pelvis was performed following the standard protocol without IV contrast. COMPARISON:  Prior study from 02/10/2014. FINDINGS: Scattered atelectatic changes present within the partially visualized lung bases.  Visualized lungs are otherwise unremarkable. Cardiomegaly partially visualized. No pleural or pericardial fusion. Limited noncontrast evaluation of the liver is unremarkable. Gallbladder is absent. No biliary dilatation. Spleen, adrenal glands, and pancreas demonstrate a normal unenhanced appearance. Scattered nonobstructive calculi present within the right kidney, largest of which measures 7 mm in the interpolar region. No hydronephrosis. There is a punctate hyperdensity at the right UVJ, which may reflect a tiny nonobstructive stone (series 2, image 69). On the left, there is an obstructive 5 mm stone present within the distal left ureter just proximal to the left UVJ. Additional layering stones present proximally within the left ureter (series 2, image 57). These measure  16 mm in length. There is moderate left hydroureteronephrosis. Additional scattered nonobstructive calculi present within the left kidney, somewhat smudgy and faint in appearance, largest of which measures 8 mm in the lower pole. Small hiatal hernia noted. Stomach otherwise unremarkable. No evidence for bowel obstruction. No evidence for acute appendicitis. Colonic diverticulosis without evidence for acute diverticulitis. Bladder within normal limits. Uterus is absent.  Ovaries within normal limits. No free air or fluid. No adenopathy. Small paraumbilical hernia noted. Fairly mild plaque within the intra-abdominal aorta. Scoliosis with severe multilevel degenerative changes present. No acute osseous abnormality. No worrisome lytic or blastic osseous lesions. IMPRESSION: 1. Obstructive 5 mm stone within the distal left ureter with secondary moderate left hydroureteronephrosis. There are additional layering stones within the dilated left ureter proximal to the distal obstructive stone. Please see above report for description these findings. 2. Additional nonobstructive bilateral nephrolithiasis as above. Question punctate stone at the distal right  UVJ without evidence of obstruction. 3. Diverticulosis without evidence for acute diverticulitis. 4. Cardiomegaly. 5. Scoliosis with severe multilevel degenerative spondylolysis. Electronically Signed   By: Jeannine Boga M.D.   On: 03/21/2015 06:19   I personally reviewed and evaluated these lab results as a part of my medical decision-making.   EKG Interpretation None      MDM   Final diagnoses:  Left ureteral stone  UTI (lower urinary tract infection)    65yF with abdominal pain. CT with obstructing L ureteral stone with layering ureteral stones proximal to it. Catheterized urine specimen and looks like may have UTI as well. Afebrile. CKD and Cr today baseline to slightly better. Will discuss with urology.   I personally preformed the services scribed in my presence. The recorded information has been reviewed is accurate. Virgel Manifold, MD.    Virgel Manifold, MD 03/28/15 [phone]

## 2015-03-21 NOTE — ED Notes (Signed)
BIB EMS c/o RUQ abdominal pain since 1900. Endorses vomiting x3, Denies diarrhea. Pain 10/10. Hx CVA last month. R sided paralysis and aphasia noted.  184/90 with EMS

## 2015-03-21 NOTE — ED Notes (Signed)
MD at bedside. 

## 2015-03-22 LAB — URINE CULTURE

## 2015-03-23 ENCOUNTER — Telehealth: Payer: Self-pay

## 2015-03-23 NOTE — Telephone Encounter (Signed)
Diane Yelton-OT from [facility] to make [facility] aware that the pt missed her OT visit this week.

## 2015-04-12 ENCOUNTER — Telehealth: Payer: Self-pay | Admitting: Physical Medicine & Rehabilitation

## 2015-04-12 NOTE — Telephone Encounter (Signed)
Please advise on PT and OT?

## 2015-04-12 NOTE — Telephone Encounter (Signed)
Michelle Holmes with [facility] is discharging patient from [place] today.  Would like to request [place] to set up outpatient PT and OT for the patient.  Please call her at [phone].

## 2015-04-12 NOTE — Telephone Encounter (Signed)
Patient needs to follow-up with me before I order any type of outpatient OT or PT

## 2015-04-15 NOTE — Telephone Encounter (Signed)
Left message with Amy stating that pt needs to make an appointment for PT and OT referral.

## 2015-04-20 ENCOUNTER — Encounter (HOSPITAL_COMMUNITY): Payer: Self-pay | Admitting: Emergency Medicine

## 2015-04-20 ENCOUNTER — Emergency Department (INDEPENDENT_AMBULATORY_CARE_PROVIDER_SITE_OTHER)
Admission: EM | Admit: 2015-04-20 | Discharge: 2015-04-20 | Discharge status: Against Medical Advice | Payer: Medicare Other | Source: Home / Self Care | Attending: Family Medicine | Admitting: Family Medicine

## 2015-04-20 DIAGNOSIS — N189 Chronic kidney disease, unspecified: Secondary | ICD-10-CM

## 2015-04-20 DIAGNOSIS — I509 Heart failure, unspecified: Secondary | ICD-10-CM

## 2015-04-20 DIAGNOSIS — R609 Edema, unspecified: Secondary | ICD-10-CM

## 2015-04-20 DIAGNOSIS — I739 Peripheral vascular disease, unspecified: Secondary | ICD-10-CM

## 2015-04-20 NOTE — Discharge Instructions (Signed)
Chronic Kidney Disease Chronic kidney disease occurs when the kidneys are damaged over a long period. The kidneys are two organs that lie on either side of the spine between the middle of the back and the front of the abdomen. The kidneys:  Remove wastes and extra water from the blood.  Produce important hormones. These help keep bones strong, regulate blood pressure, and help create red blood cells.  Balance the fluids and chemicals in the blood and tissues. A small amount of kidney damage may not cause problems, but a large amount of damage may make it difficult or impossible for the kidneys to work the way they should. If steps are not taken to slow down the kidney damage or stop it from getting worse, the kidneys may stop working permanently. Most of the time, chronic kidney disease does not go away. However, it can often be controlled, and those with the disease can usually live normal lives. CAUSES The most common causes of chronic kidney disease are diabetes and high blood pressure (hypertension). Chronic kidney disease may also be caused by:  Diseases that cause the kidneys' filters to become inflamed.  Diseases that affect the immune system.  Genetic diseases.  Medicines that damage the kidneys, such as anti-inflammatory medicines.  Poisoning or exposure to toxic substances.  A reoccurring kidney or urinary infection.  A problem with urine flow. This may be caused by:  Cancer.  Kidney stones.  An enlarged prostate in males. SIGNS AND SYMPTOMS Because the kidney damage in chronic kidney disease occurs slowly, symptoms develop slowly and may not be obvious until the kidney damage becomes severe. A person may have a kidney disease for years without showing any symptoms. Symptoms can include:  Swelling (edema) of the legs, ankles, or feet.  Tiredness (lethargy).  Nausea or vomiting.  Confusion.  Problems with urination, such as:  Decreased urine  production.  Frequent urination, especially at night.  Frequent accidents in children who are potty trained.  Muscle twitches and cramps.  Shortness of breath.  Weakness.  Persistent itchiness.  Loss of appetite.  Metallic taste in the mouth.  Trouble sleeping.  Slowed development in children.  Short stature in children. DIAGNOSIS Chronic kidney disease may be detected and diagnosed by tests, including blood, urine, imaging, or kidney biopsy tests. TREATMENT Most chronic kidney diseases cannot be cured. Treatment usually involves relieving symptoms and preventing or slowing the progression of the disease. Treatment may include:  A special diet. You may need to avoid alcohol and foods thatare salty and high in potassium.  Medicines. These may:  Lower blood pressure.  Relieve anemia.  Relieve swelling.  Protect the bones. HOME CARE INSTRUCTIONS  Follow your prescribed diet. Your health care provider may instruct you to limit daily salt (sodium) and protein intake.  Take medicines only as directed by your health care provider. Do not take any new medicines (prescription, over-the-counter, or nutritional supplements) unless approved by your health care provider. Many medicines can worsen your kidney damage or need to have the dose adjusted.   Quit smoking if you smoke. Talk to your health care provider about a smoking cessation program.  Keep all follow-up visits as directed by your health care provider.  Monitor your blood pressure.  Start or continue an exercise plan.  Get immunizations as directed by your health care provider.  Take vitamin and mineral supplements as directed by your health care provider. SEEK IMMEDIATE MEDICAL CARE IF:  Your symptoms get worse or you develop  new symptoms.  You develop symptoms of end-stage kidney disease. These include:  Headaches.  Abnormally [place] or light skin.  Numbness in the hands or feet.  Easy  bruising.  Frequent hiccups.  Menstruation stops.  You have a fever.  You have decreased urine production.  You havepain or bleeding when urinating. MAKE SURE YOU:  Understand these instructions.  Will watch your condition.  Will get help right away if you are not doing well or get worse. FOR MORE INFORMATION   American Association of Kidney Patients: BombTimer.gl  National Kidney Foundation: www.kidney.[place]: https://mathis.com/  [facility]: www.lifeoptions.org and www.kidneyschool.org   This information is not intended to replace advice given to you by your health care provider. Make sure you discuss any questions you have with your health care provider.   Document Released: 10/22/2007 Document Revised: 02/02/2014 Document Reviewed: 09/11/2011 Elsevier Interactive Patient Education 2016 [employer]  Edema Edema is an abnormal buildup of fluids. It is more common in your legs and thighs. Painless swelling of the feet and ankles is more likely as a person ages. It also is common in looser skin, like around your eyes. HOME CARE   Keep the affected body part above the level of the heart while lying down.  Do not sit still or stand for a long time.  Do not put anything right under your knees when you lie down.  Do not wear tight clothes on your upper legs.  Exercise your legs to help the puffiness (swelling) go down.  Wear elastic bandages or support stockings as told by your doctor.  A low-salt diet may help lessen the puffiness.  Only take medicine as told by your doctor. GET HELP IF:  Treatment is not working.  You have heart, liver, or kidney disease and notice that your skin looks puffy or shiny.  You have puffiness in your legs that does not get better when you raise your legs.  You have sudden weight gain for no reason. GET HELP RIGHT AWAY IF:   You have shortness of breath or chest pain.  You cannot  breathe when you lie down.  You have pain, redness, or warmth in the areas that are puffy.  You have heart, liver, or kidney disease and get edema all of a sudden.  You have a fever and your symptoms get worse all of a sudden. MAKE SURE YOU:   Understand these instructions.  Will watch your condition.  Will get help right away if you are not doing well or get worse.   This information is not intended to replace advice given to you by your health care provider. Make sure you discuss any questions you have with your health care provider.   Document Released: 07/01/2007 Document Revised: 01/17/2013 Document Reviewed: 11/04/2012 Elsevier Interactive Patient Education 2016 [place].  Heart Failure Heart failure means your heart has trouble pumping blood. This makes it hard for your body to work well. Heart failure is usually a long-term (chronic) condition. You must take good care of yourself and follow your doctor's treatment plan. HOME CARE  Take your heart medicine as told by your doctor.  Do not stop taking medicine unless your doctor tells you to.  Do not skip any dose of medicine.  Refill your medicines before they run out.  Take other medicines only as told by your doctor or pharmacist.  Stay active if told by your doctor. The elderly and people with severe heart  failure should talk with a doctor about physical activity.  Eat heart-healthy foods. Choose foods that are without trans fat and are low in saturated fat, cholesterol, and salt (sodium). This includes fresh or frozen fruits and vegetables, fish, lean meats, fat-free or low-fat dairy foods, whole grains, and high-fiber foods. Lentils and dried peas and beans (legumes) are also good choices.  Limit salt if told by your doctor.  Cook in a healthy way. Roast, grill, broil, bake, poach, steam, or stir-fry foods.  Limit fluids as told by your doctor.  Weigh yourself every morning. Do this after you pee (urinate)  and before you eat breakfast. Write down your weight to give to your doctor.  Take your blood pressure and write it down if your doctor tells you to.  Ask your doctor how to check your pulse. Check your pulse as told.  Lose weight if told by your doctor.  Stop smoking or chewing tobacco. Do not use gum or patches that help you quit without your doctor's approval.  Schedule and go to doctor visits as told.  Nonpregnant women should have no more than 1 drink a day. Men should have no more than 2 drinks a day. Talk to your doctor about drinking alcohol.  Stop illegal drug use.  Stay current with shots (immunizations).  Manage your health conditions as told by your doctor.  Learn to manage your stress.  Rest when you are tired.  If it is really hot outside:  Avoid intense activities.  Use air conditioning or fans, or get in a cooler place.  Avoid caffeine and alcohol.  Wear loose-fitting, lightweight, and light-colored clothing.  If it is really cold outside:  Avoid intense activities.  Layer your clothing.  Wear mittens or gloves, a hat, and a scarf when going outside.  Avoid alcohol.  Learn about heart failure and get support as needed.  Get help to maintain or improve your quality of life and your ability to care for yourself as needed. GET HELP IF:   You gain weight quickly.  You are more short of breath than usual.  You cannot do your normal activities.  You tire easily.  You cough more than normal, especially with activity.  You have any or more puffiness (swelling) in areas such as your hands, feet, ankles, or belly (abdomen).  You cannot sleep because it is hard to breathe.  You feel like your heart is beating fast (palpitations).  You get dizzy or light-headed when you stand up. GET HELP RIGHT AWAY IF:   You have trouble breathing.  There is a change in mental status, such as becoming less alert or not being able to focus.  You have chest  pain or discomfort.  You faint. MAKE SURE YOU:   Understand these instructions.  Will watch your condition.  Will get help right away if you are not doing well or get worse.   This information is not intended to replace advice given to you by your health care provider. Make sure you discuss any questions you have with your health care provider.   Document Released: 10/22/2007 Document Revised: 02/02/2014 Document Reviewed: 02/29/2012 Elsevier Interactive Patient Education 2016 Else[employer] Inc.  Peripheral Edema You have swelling in your legs (peripheral edema). This swelling is due to excess accumulation of salt and water in your body. Edema may be a sign of heart, kidney or liver disease, or a side effect of a medication. It may also be due to problems in the  leg veins. Elevating your legs and using special support stockings may be very helpful, if the cause of the swelling is due to poor venous circulation. Avoid long periods of standing, whatever the cause. Treatment of edema depends on identifying the cause. Chips, pretzels, pickles and other salty foods should be avoided. Restricting salt in your diet is almost always needed. Water pills (diuretics) are often used to remove the excess salt and water from your body via urine. These medicines prevent the kidney from reabsorbing sodium. This increases urine flow. Diuretic treatment may also result in lowering of potassium levels in your body. Potassium supplements may be needed if you have to use diuretics daily. Daily weights can help you keep track of your progress in clearing your edema. You should call your caregiver for follow up care as recommended. SEEK IMMEDIATE MEDICAL CARE IF:   You have increased swelling, pain, redness, or heat in your legs.  You develop shortness of breath, especially when lying down.  You develop chest or abdominal pain, weakness, or fainting.  You have a fever.   This information is not intended to  replace advice given to you by your health care provider. Make sure you discuss any questions you have with your health care provider.   Document Released: 02/20/2004 Document Revised: 04/06/2011 Document Reviewed: 07/25/2014 Elsevier Interactive Patient Education 2016 Elsevier Inc.  Peripheral Vascular Disease Peripheral vascular disease (PVD) is a disease of the blood vessels that are not part of your heart and brain. A simple term for PVD is poor circulation. In most cases, PVD narrows the blood vessels that carry blood from your heart to the rest of your body. This can result in a decreased supply of blood to your arms, legs, and internal organs, like your stomach or kidneys. However, it most often affects a person's lower legs and feet. There are two types of PVD.  Organic PVD. This is the more common type. It is caused by damage to the structure of blood vessels.  Functional PVD. This is caused by conditions that make blood vessels contract and tighten (spasm). Without treatment, PVD tends to get worse over time. PVD can also lead to acute ischemic limb. This is when an arm or limb suddenly has trouble getting enough blood. This is a medical emergency. CAUSES Each type of PVD has many different causes. The most common cause of PVD is buildup of a fatty material (plaque) inside of your arteries (atherosclerosis). Small amounts of plaque can break off from the walls of the blood vessels and become lodged in a smaller artery. This blocks blood flow and can cause acute ischemic limb. Other common causes of PVD include:  Blood clots that form inside of blood vessels.  Injuries to blood vessels.  Diseases that cause inflammation of blood vessels or cause blood vessel spasms.  Health behaviors and health history that increase your risk of developing PVD. RISK FACTORS  You may have a greater risk of PVD if you:  Have a family history of PVD.  Have certain medical conditions,  including:  High cholesterol.  Diabetes.  High blood pressure (hypertension).  Coronary heart disease.  Past problems with blood clots.  Past injury, such as burns or a broken bone. These may have damaged blood vessels in your limbs.  Buerger disease. This is caused by inflamed blood vessels in your hands and feet.  Some forms of arthritis.  Rare birth defects that affect the arteries in your legs.  Use tobacco.  Do not get enough exercise.  Are obese.  Are age 93 or older. SIGNS AND SYMPTOMS  PVD may cause many different symptoms. Your symptoms depend on what part of your body is not getting enough blood. Some common signs and symptoms include:  Cramps in your lower legs. This may be a symptom of poor leg circulation (claudication).  Pain and weakness in your legs while you are physically active that goes away when you rest (intermittent claudication).  Leg pain when at rest.  Leg numbness, tingling, or weakness.  Coldness in a leg or foot, especially when compared with the other leg.  Skin or hair changes. These can include:  Hair loss.  Shiny skin.  Pale or bluish skin.  Thick toenails.  Inability to get or maintain an erection (erectile dysfunction). People with PVD are more prone to developing ulcers and sores on their toes, feet, or legs. These may take longer than normal to heal. DIAGNOSIS Your health care provider may diagnose PVD from your signs and symptoms. The health care provider will also do a physical exam. You may have tests to find out what is causing your PVD and determine its severity. Tests may include:  Blood pressure recordings from your arms and legs and measurements of the strength of your pulses (pulse volume recordings).  Imaging studies using sound waves to take pictures of the blood flow through your blood vessels (Doppler ultrasound).  Injecting a dye into your blood vessels before having imaging studies using:  X-rays  (angiogram or arteriogram).  Computer-generated X-rays (CT angiogram).  A powerful electromagnetic field and a computer (magnetic resonance angiogram or MRA). TREATMENT Treatment for PVD depends on the cause of your condition and the severity of your symptoms. It also depends on your age. Underlying causes need to be treated and controlled. These include long-lasting (chronic) conditions, such as diabetes, high cholesterol, and high blood pressure. You may need to first try making lifestyle changes and taking medicines. Surgery may be needed if these do not work. Lifestyle changes may include:  Quitting smoking.  Exercising regularly.  Following a low-fat, low-cholesterol diet. Medicines may include:  Blood thinners to prevent blood clots.  Medicines to improve blood flow.  Medicines to improve your blood cholesterol levels. Surgical procedures may include:  A procedure that uses an inflated balloon to open a blocked artery and improve blood flow (angioplasty).  A procedure to put in a tube (stent) to keep a blocked artery open (stent implant).  Surgery to reroute blood flow around a blocked artery (peripheral bypass surgery).  Surgery to remove dead tissue from an infected wound on the affected limb.  Amputation. This is surgical removal of the affected limb. This may be necessary in cases of acute ischemic limb that are not improved through medical or surgical treatments. HOME CARE INSTRUCTIONS  Take medicines only as directed by your health care provider.  Do not use any tobacco products, including cigarettes, chewing tobacco, or electronic cigarettes. If you need help quitting, ask your health care provider.  Lose weight if you are overweight, and maintain a healthy weight as directed by your health care provider.  Eat a diet that is low in fat and cholesterol. If you need help, ask your health care provider.  Exercise regularly. Ask your health care provider to suggest  some good activities for you.  Use compression stockings or other mechanical devices as directed by your health care provider.  Take good care of your feet.  Wear comfortable  shoes that fit well.  Check your feet often for any cuts or sores. SEEK MEDICAL CARE IF:  You have cramps in your legs while walking.  You have leg pain when you are at rest.  You have coldness in a leg or foot.  Your skin changes.  You have erectile dysfunction.  You have cuts or sores on your feet that are not healing. SEEK IMMEDIATE MEDICAL CARE IF:  Your arm or leg turns cold and blue.  Your arms or legs become red, warm, swollen, painful, or numb.  You have chest pain or trouble breathing.  You suddenly have weakness in your face, arm, or leg.  You become very confused or lose the ability to speak.  You suddenly have a very bad headache or lose your vision.   This information is not intended to replace advice given to you by your health care provider. Make sure you discuss any questions you have with your health care provider.   Document Released: 02/20/2004 Document Revised: 02/02/2014 Document Reviewed: 06/22/2013 Elsevier Interactive Patient Education [employer].

## 2015-04-20 NOTE — ED Notes (Signed)
C/o bilateral foot edema/pain onset Tuesday Also c/o right hand/wrist pain onset Tuesday Pain increases w/activity... Brought back in wheelchair A&O x4... No acute distress.

## 2015-04-20 NOTE — ED Provider Notes (Signed)
CSN: [id number]     Arrival date & time 04/20/15  1542 History   First MD Initiated Contact with Patient 04/20/15 1729     Chief Complaint  Patient presents with  . Leg Swelling  . Hand Pain   (Consider location/radiation/quality/duration/timing/severity/associated sxs/prior Treatment) HPI Comments: 64 year old female with multisystem disease states presents to the urgent care with acute peripheral edema starting 4 days ago. She has a history of CVA with right-sided hemiplegia. The swelling involves the bilateral lower extremities as well as the right distal upper extremity and hand. The left hand appears unaffected. She denies shortness of breath. Denies chest pain. She is complaining of chronic pain originating from her back with degenerative disc disease, scoliosis and obesity. She is on Percocet. She ran out of her medicine. Her past medical history is significant for hypertension, uncontrolled diabetes mellitus, COPD, coronary artery disease, morbid obesity, CVA, TIA, CHF, renal insufficiency, EOMI and sleep apnea among others.   Past Medical History  Diagnosis Date  . Emphysema   . Colon cancer ([place])   . Hypertension   . Hypercholesteremia   . Diabetes mellitus   . COPD (chronic obstructive pulmonary disease) ([place])   . Coronary artery disease   . Obesity   . Sleep apnea   . TIA (transient ischemic attack)   . CHF (congestive heart failure) ([place])   . Anemia   . DJD (degenerative joint disease)   . Renal insufficiency   . Chronic back pain   . Scoliosis   . Obesity hypoventilation syndrome ([place])   . Stroke ([place])   . Myocardial infarction [facility])    Past Surgical History  Procedure Laterality Date  . Coronary angioplasty with stent placement    . Colon surgery     No family history on file. Social History  Substance Use Topics  . Smoking status: Former Research scientist (life sciences)  . Smokeless tobacco: None  . Alcohol Use: No   OB History    No data available     Review of Systems   Constitutional: Positive for activity change and fatigue. Negative for fever.  HENT: Negative.   Respiratory:       Denies change or increase in shortness of breath.  Cardiovascular: Positive for leg swelling. Negative for chest pain.  Gastrointestinal: Negative.   Genitourinary: Negative.   Musculoskeletal: Positive for back pain.  Skin: Negative for rash and wound.  Neurological: Positive for weakness.       Right hemiplegia    Allergies  Penicillins  Home Medications   Prior to Admission medications   Medication Sig Start Date End Date Taking? Authorizing Provider  glipiZIDE (GLUCOTROL) 5 MG tablet Take 1 tablet (5 mg total) by mouth daily before breakfast. 02/07/15  Yes Daniel J Angiulli, PA-C  insulin glargine (LANTUS) 100 UNIT/ML injection Inject 0.3 mLs (30 Units total) into the skin at bedtime. 02/07/15  Yes Daniel J Angiulli, PA-C  oxyCODONE-acetaminophen (PERCOCET/ROXICET) 5-325 MG tablet Take 1-2 tablets by mouth every 4 (four) hours as needed for severe pain. 03/21/15  Yes Virgel Manifold, MD  ADVAIR DISKUS 250-50 MCG/DOSE AEPB Inhale 1 puff into the lungs 2 (two) times daily.  08/22/14   Historical Provider, MD  aspirin EC 81 MG EC tablet Take 1 tablet (81 mg total) by mouth daily. 06/22/14   Charolette Forward, MD  atorvastatin (LIPITOR) 40 MG tablet Take 1 tablet (40 mg total) by mouth daily. 02/07/15   Lavon Paganini Angiulli, PA-C  clopidogrel (PLAVIX) 75 MG tablet Take 1 tablet (  75 mg total) by mouth daily. 02/07/15   Lavon Paganini Angiulli, PA-C  fenofibrate 54 MG tablet Take 1 tablet (54 mg total) by mouth daily. 02/07/15   Lavon Paganini Angiulli, PA-C  ferrous sulfate 325 (65 FE) MG tablet Take 1 tablet (325 mg total) by mouth 2 (two) times daily with a meal. 02/07/15   Daniel J Angiulli, PA-C  LYRICA 50 MG capsule Take 1 capsule (50 mg total) by mouth 2 (two) times daily. 02/07/15   Lavon Paganini Angiulli, PA-C  ondansetron (ZOFRAN) 4 MG tablet Take 1 tablet (4 mg total) by mouth every 6 (six) hours.  03/21/15   Virgel Manifold, MD  pantoprazole (PROTONIX) 40 MG tablet Take 1 tablet (40 mg total) by mouth daily. 02/07/15   Lavon Paganini Angiulli, PA-C   Meds Ordered and Administered this Visit  Medications - No data to display  BP 167/85 mmHg  Pulse 70  Temp(Src) 98.4 F (36.9 C)  SpO2 99% No data found.   Physical Exam  Constitutional: She is oriented to person, place, and time. No distress.  Morbidly obese female sitting in a wheelchair. She is chair and/or bedbound due to right-sided hemiplegia status post CVA. She receives home health care as well as physical therapy. Showing no signs of respiratory distress.  Eyes: EOM are normal.  Neck: Normal range of motion. Neck supple.  Cardiovascular: Normal rate, regular rhythm and normal heart sounds.   Pulmonary/Chest: No respiratory distress.  Respirations are shallow. Attempted to have patient take deep breaths for auscultation, however, she is unable or unwilling to take and deep breaths. Due to the small amount of air movement is typical to make a good assessment..  Musculoskeletal: She exhibits edema.  Lower extremities with 2+ pitting edema and mild tenderness. There is edema to the right forearm wrist and hand. There is normal warmth to the lower extremities. Unable to palpate pulses due to edema.  Neurological: She is alert and oriented to person, place, and time. She exhibits normal muscle tone.  Skin: Skin is warm and dry.  Nursing note and vitals reviewed.   ED Course  Procedures (including critical care time)  Labs Review Labs Reviewed - No data to display  Imaging Review No results found.   Visual Acuity Review  Right Eye Distance:   Left Eye Distance:   Bilateral Distance:    Right Eye Near:   Left Eye Near:    Bilateral Near:         MDM   1. Peripheral edema   2. Acute on chronic congestive heart failure, unspecified congestive heart failure type ([place])   3. CKD (chronic kidney disease), unspecified  stage   4. Morbid obesity due to excess calories ([place])   5. Peripheral vascular disease ([place])    Recommend the patient go to the emergency department now for evaluation and management. She is refusing to go. Patient has multisystem disease that may contribute peripheral edema. The most likely etiology is poor peripheral venous return, CHF or renal failure. This is been explained to the patient and her son and advised that we are unable to evaluate this condition in the urgent care. This would require management only provided at the emergency department. She is strongly advised to go to the emergency department at this time. She states she understands the complications and possible poor outcomes but refuses emphatically to go to the emergency department. She states she will try to follow up with her PCP next week. She is advised  that should she become short of breath, developed chest pain, increase swelling or worsening in other ways she needs to go directly to the merchant department promptly.    Janne Napoleon, NP 04/20/15 [phone]

## 2015-05-01 ENCOUNTER — Encounter: Payer: Self-pay | Admitting: Physical Therapy

## 2015-05-01 ENCOUNTER — Telehealth: Payer: Self-pay | Admitting: Physical Therapy

## 2015-05-01 ENCOUNTER — Ambulatory Visit: Payer: Medicare Other | Attending: Physical Medicine & Rehabilitation | Admitting: Physical Therapy

## 2015-05-01 DIAGNOSIS — G8111 Spastic hemiplegia affecting right dominant side: Secondary | ICD-10-CM

## 2015-05-01 DIAGNOSIS — R293 Abnormal posture: Secondary | ICD-10-CM | POA: Diagnosis present

## 2015-05-01 DIAGNOSIS — R2689 Other abnormalities of gait and mobility: Secondary | ICD-10-CM

## 2015-05-01 DIAGNOSIS — R208 Other disturbances of skin sensation: Secondary | ICD-10-CM

## 2015-05-01 NOTE — Therapy (Signed)
[facility] [address] [place], [place], [address] Phone: [phone]   Fax:  [phone]  Physical Therapy Evaluation  Patient Details  Name: Michelle Holmes MRN: [id number] Date of Birth: September 10, 1950 Referring Provider: Alysia Penna, MD  Encounter Date: 05/01/2015      PT End of Session - 05/01/15 1307    Visit Number 1   Number of Visits 17  eval + 16 visits   Date for PT Re-Evaluation 06/30/15   Authorization Type [employer] MCR - G Codes and PN required every 10 visits   PT Start Time 1015   PT Stop Time 1101   PT Time Calculation (min) 46 min   Activity Tolerance Patient tolerated treatment well   Behavior During Therapy --  emotionally labile      Past Medical History  Diagnosis Date  . Emphysema   . Colon cancer ([place])   . Hypertension   . Hypercholesteremia   . Diabetes mellitus   . COPD (chronic obstructive pulmonary disease) ([place])   . Coronary artery disease   . Obesity   . Sleep apnea   . TIA (transient ischemic attack)   . CHF (congestive heart failure) ([place])   . Anemia   . DJD (degenerative joint disease)   . Renal insufficiency   . Chronic back pain   . Scoliosis   . Obesity hypoventilation syndrome ([place])   . Stroke ([place])   . Myocardial infarction [facility])     Past Surgical History  Procedure Laterality Date  . Coronary angioplasty with stent placement    . Colon surgery      There were no vitals filed for this visit.  Visit Diagnosis:  Spastic hemiplegia affecting right dominant side ([place]) - Plan: PT plan of care cert/re-cert  Other abnormalities of gait and mobility - Plan: PT plan of care cert/re-cert  Abnormal posture - Plan: PT plan of care cert/re-cert  Other disturbances of skin sensation - Plan: PT plan of care cert/re-cert      Subjective Assessment - 05/01/15 1023    Subjective Pt sustained CVA on 01/13/15; hospitalized from 01/13/15 - 02/07/15, including inpatient rehab  from 01/18/15 - 02/07/15. Patient discharged home at wheelchair level with daughter and friends. Did ambulate in on inpatient rehab (using KAFO, per chart); but pt doesn't like this brace. Pt very tearful during this session, stating, "I want to do things for myself. This stroke really got me."   Pertinent History PMH significant for: CVA x2 (most recent: 01/13/15), HTN, HLD, CHF, COPD, OSA, CAD, DM II, colon cancer, DJD, scoliosis, chronic renal insufficiency   Patient Stated Goals "I want a new wheelchair and I want to walk."   Currently in Pain? No/denies  No c/o pain.            [facility] PT Assessment - 05/01/15 0001    Assessment   Medical Diagnosis Acute ischemic VBA thalamic stroke, left    Referring Provider Alysia Penna, MD   Onset Date/Surgical Date 01/13/16   Hand Dominance Right   Precautions   Precautions Fall   Required Braces or Orthoses Other Brace/Splint   Other Brace/Splint Has R AFO but does not wear; asked that pt bring to first PT session.   Restrictions   Weight Bearing Restrictions No   Balance Screen   Has the patient fallen in the past 6 months Yes   How many times? 1   Has the patient had a decrease in activity level because  of a fear of falling?  Yes   Is the patient reluctant to leave their home because of a fear of falling?  No   Home Ecologist residence   Living Arrangements Other (Comment)  lives with nephew, who works FT   Available Help at Discharge Family;Friend(s)   Home Access Level entry   Home Layout One level   Home Equipment Wheelchair - manual  L foot rest broken   Additional Comments Lives with nephew and daughter comes in to help. Daughter and nephew work Medical laboratory scientific officer. Friend helps out when possible.   Prior Function   Level of Independence Independent with household mobility with device;Other (comment)  mod I using RW   Cognition   Overall Cognitive Status Impaired/Different from baseline   Area of Impairment  Memory;Awareness   Memory Decreased short-term memory   Memory Impaired   Memory Impairment Decreased short term memory   Decreased Short Term Memory Functional basic   Awareness Impaired   Awareness Impairment Emergent impairment   Problem Solving --   Behaviors Lability   Observation/Other Assessments   Observations L wheechair foot plate completely broken. R w/c leg rest bent, and therefore leg rest unlocked, causing RLE to externally rotate. No wheelchair cushion, of which pt states, "I lost it. I need you to get me a new one."   Sensation   Light Touch Impaired Detail   Light Touch Impaired Details Impaired RLE   Proprioception Impaired Detail   Proprioception Impaired Details Impaired RLE   Coordination   Gross Motor Movements are Fluid and Coordinated No   Heel Shin Test Unable to perform on RLE due to R hemiplegia.   Posture/Postural Control   Posture/Postural Control Postural limitations   ROM / Strength   AROM / PROM / Strength Strength   Transfers   Transfers Sit to Stand;Stand to Sit   Sit to Stand 2: Max assist   Sit to Stand Details Tactile cues for placement;Tactile cues for weight beaing;Verbal cues for technique;Manual facilitation for weight shifting   Stand to Sit 3: Mod assist   Squat Pivot Transfers 2: Max assist;3: Mod assist   Squat Pivot Transfer Details (indicate cue type and reason) level transfer from w/c <> mat table; mod A to L, max A to R. Max multimodal cueing for setup, foot placement, weight shifting (anterior), attention to RLE, and technique.   Ambulation/Gait   Ambulation/Gait No  Pt has not ambulated since in hospital 02/07/15.                   [place] [facility] Adult PT Treatment/Exercise - 05/01/15 0001    Bed Mobility   Bed Mobility Supine to Sit;Rolling Right;Rolling Left;Scooting to [facility];Sitting - Scoot to [employer] of Bed;Sit to Supine   Rolling Right 4: Min assist   Rolling Right Details (indicate cue type and reason) multimodal cueing for  initiation of LUE movement across midline to initiate rolling.   Rolling Left 3: Mod assist   Supine to Sit 3: Mod assist   Supine to Sit Details (indicate cue type and reason) logroll technique for efficiency   Sitting - Scoot to Edge of Bed 4: Min assist   Sitting - Scoot to Edge of Bed Details (indicate cue type and reason) cueing for awareness of R foot/LE position, for adjustment of RLE position; tactile cueing at R knee for proprioceptive input, increased WB; manual facilitation of full anterior weight shift   Sit to Supine 3: Mod assist  Sit to Supine - Details (indicate cue type and reason) mod A for BLE management   Scooting to [facility] 4: Min assist   Scooting to [facility] Details (indicate cue type and reason) requires max cueing to utilize RLE during bridging to scoot; tactile cueig at R knee for WB.   Transfers   Sit to Stand Details (indicate cue type and reason) from mat table with max A, manual facilitation for full anterior weight shift.    Chief Technology Officer Yes   Wheelchair Assistance 4: Research scientist (medical) cues for initiation;Tactile cues for sequencing;Verbal cues for technique;Tactile cues for placement;Tactile cues for weight beaing   Wheelchair Propulsion Left upper extremity;Left lower extremity   Wheelchair Parts Management Needs assistance   Distance 120   Comments Required max encouragement. Initially required min A, tactile cueing at L knee for increased LLE WB. Required close (S) to min guard for remainder of trial due to decreased attention to R visual field and to R hand (to prevent R hand from getting caught in w/c spokes).   Posture/Postural Control   Postural Limitations Rounded Shoulders;Posterior pelvic tilt;Weight shift right   Posture Comments Significant posterior pelvic tilt, R hip ER (due to broken leg rest). Requires frequent cueing to avoid R hand getting caught in R wheel.   Balance   Balance Assessed Yes    Balance comment Performed static standing x15 seconds with max A ,significant pushing of trunk to R side; aware of pushing but unable to correct.   Standardized Balance Assessment   Standardized Balance Assessment PASS  PASS score = 12/36; see eval note for details         Changing a Posture  _1_ 6. Supine to Paretic Side Lateral Instructions: Begin with the subject in supine on a treatment mat. Instruct the subject to roll to the paretic side (lateral movement). Assist as necessary. Evaluate the subject's performance on the amount of help required. Do not consider the quality of performance. (3) Can perform without help (2) Can perform with little help (1) Can perform with much help (0) Cannot perform  _2_ 7. Supine to Nonparetic Side Lateral Instructions: Begin with the subject in supine on a treatment mat. Instruct the subject to roll to the nonparetic side (lateral movement). Assist as necessary. Evaluate the subject's performance on the amount of help required. Do not consider the quality of performance. (3) Can perform without help (2) Can perform with little help (1) Can perform with much help (0) Cannot perform  _1_ 8. Supine to Sitting Up on the Edge of the Mat Instructions: Begin with the subject in supine on a treatment mat. Instruct the subject to come to sitting on the edge of the mat. Assist as necessary. Evaluate the subject's performance on the amount of help required. Do not consider the quality of performance. (3) Can perform without help (2) Can perform with little help (1) Can perform with much help (0) Cannot perform  _1_ 9. Sitting on the Edge of the Mat to Supine Instructions: Begin with the on the edge of a treatment mat. Instruct the subject to return to supine. Assist as necessary. Evaluate the subject's performance on the amount of help required. Do not consider the quality of performance. (3) Can perform without help (2) Can perform with little  help (1) Can perform with much help (0) Cannot perform  _1_ 10. Sitting to Standing Up Instructions: Begin with the subject sitting on the  edge of a treatment mat. Instruct the subject to stand up without support. Assist if necessary. Evaluate the subject's performance on the amount of help required. Do not consider the quality of performance. (3) Can perform without help (2) Can perform with little help (1) Can perform with much help (0) Cannot perform  _1_ 11. Standing Up to Sitting Down Instructions: Begin with the subject standing by edge of a treatment mat. Instruct the subject to sit on edge of mat without support. Assist if necessary. Evaluate the subject's performance on the amount of help required. Do not consider the quality of performance. (3) Can perform without help (2) Can perform with little help (1) Can perform with much help (0) Cannot perform  _0_ 12. Standing, Picking Up a Pencil from the Floor Instructions: Begin with the subject standing. Instruct the subject to pick up a pencil fro the floor without support. Assist if necessary. Evaluate the subject's performance on the amount of help required. Do not consider the quality of performance. (3) Can perform without help (2) Can perform with little help (1) Can perform with much help (0) Cannot perform  Changing Posture SUBTOTAL __7___   TOTAL _12/36____         PT Education - 05/01/15 1305    Education provided Yes   Education Details PT eval findings, goals, and POC. Explained that PT will first address bed mobility, transfers, and standing; then will assess if walking is appropriate goal. Recommending pt bring R AFO to next session. Also recommending OT consult.   Person(s) Educated Patient   Methods Explanation   Comprehension Verbalized understanding          PT Short Term Goals - 05/01/15 1436    PT SHORT TERM GOAL #1   Title Pt will perform home exercises with mod I using paper handout to  maximize functional gains made in PT.  (Target date: 05/29/15)   PT SHORT TERM GOAL #2   Title Pt will perform supine <> sit with supervision, min cueing to indicate increased independence getting into/out of bed.   PT SHORT TERM GOAL #3   Title Pt will perform level transfers from w/c <> mat table in B directions with min A, min cueing to indicate increased independence with transfers.   PT SHORT TERM GOAL #4   Title Pt will perform sit <> stand from w/c with min A, min cueing to indicate increased pt independence with functional transfers.   PT SHORT TERM GOAL #5   Title Pt will perform dynamic standing task x2 consecutive minutes with single UE support and min to indicate improved postural awareness/control with functional standing.   Additional Short Term Goals   Additional Short Term Goals Yes   PT SHORT TERM GOAL #6   Title ATP will assess current w/c to assist in determining if pt appropriate candidate for custom w/c to improve positioning, preserve skin integrity, and increase pt independence with mobility.    PT SHORT TERM GOAL #7   Title Pt will improve PASS score from 12/36 to 15/36 to indicate improved independence iwth funcitonal mobility.           PT Long Term Goals - 05/01/15 1501    PT LONG TERM GOAL #1   Title Pt will consistently perform supine <> sit with mod I, no cueing to indicate increased independence getting into/out of bed.  (Target date: 06/26/15)   PT LONG TERM GOAL #2   Title Pt will perform level transfers from w/c <>  mat table in B directions with supervision to indicate increased independence with transfers.   PT LONG TERM GOAL #3   Title Pt will perform unlevel transfers in B directions with min A, min cueing for increased safety/independence with functional transfers.   PT LONG TERM GOAL #4   Title Pt will perform sit <> stand from w/c with supervision to indicate increased pt independence with functional transfers.   PT LONG TERM GOAL #5   Title Pt will  improve PASS score from 12/36 to 18/36 to indicate significantly improved independence with functional mobility.   Additional Long Term Goals   Additional Long Term Goals Yes   PT LONG TERM GOAL #6   Title Will attempt ambulation and set goal, if appropriate, to assess if safe/functional to address short-distance household ambulation.               Plan - May 07, 2015 1309    Clinical Impression Statement Pt is a 65 y/o F referrred to outpatient PT to address functional impairments associated with L thalamic CVA sustained 01/13/15. Pt hospitalized from 01/13/15 - 02/07/15 including inpatient rehab from 01/18/15 - 02/07/15. PMH significant for: CVA x2 (most recent: 01/13/15), HTN, HLD, CHF, COPD, OSA, CAD, DM II, colon cancer, DJD, scoliosis, chronic renal insufficiency. PT evaluation reveals the following: R spastic hemiplegia; decreased attention to R visual field and R hemibody; poor midline orientation and postural control  (pushes to R in standing); poor postural alignment in seated; impaired stability/independence with all aspects of functional mobility. Pt will benefit from skilled outpatient PT 2x/week for 8 weeks to address said impairments.    Pt will benefit from skilled therapeutic intervention in order to improve on the following deficits Postural dysfunction;Impaired tone;Abnormal gait;Decreased balance;Decreased activity tolerance;Decreased endurance;Decreased coordination;Decreased mobility;Impaired perceived functional ability;Impaired sensation;Decreased knowledge of use of DME;Decreased strength   Rehab Potential Fair   Clinical Impairments Affecting Rehab Potential cognitive impairments; limited family support; relies on friend for transportation   PT Frequency 2x / week   PT Duration 8 weeks   PT Treatment/Interventions ADLs/Self Care Home Management;DME Instruction;Electrical Stimulation;Vestibular;Gait training;Neuromuscular re-education;Stair training;Functional mobility  training;Therapeutic activities;Patient/family education;Therapeutic exercise;Balance training;Prosthetic Training;Orthotic Fit/Training;Wheelchair mobility training   PT Next Visit Plan Please let patient know she has w/c evaluation on 4/25 @ 9:30 am.  Bed mobility, including supine <> sit; B rolling; supine bridging to scoot; and seated scooting. Initiate HEP to increase independence with bed mobility with emphasis on attention to R.   Recommended Other Services Sent in-basket telephone encounter to Dr. Letta Pate requesting OT order. Need to follow up on this.    W/C evaluation with Felton Clinton from [facility] on 4/25 @ 9:30 am.   Consulted and Agree with Plan of Care Patient          G-Codes - 07-May-2015 1538    Functional Assessment Tool Used PASS = 12/36   Functional Limitation Changing and maintaining body position   Changing and Maintaining Body Position Current Status [id number]) At least 60 percent but less than 80 percent impaired, limited or restricted   Changing and Maintaining Body Position Goal Status [id number]) At least 40 percent but less than 60 percent impaired, limited or restricted       Problem List Patient Active Problem List   Diagnosis Date Noted  . Absolute anemia   . Thrombocytopenia ([place])   . Acute ischemic VBA thalamic stroke ([place]) 01/18/2015  . AKI (acute kidney injury) ([place])   . Dysarthria   . Lethargy   .  DM type 2 with diabetic peripheral neuropathy ([place])   . Labile blood pressure   . Hyperlipidemia   . History of CVA with residual deficit   . Chronic obstructive pulmonary disease ([place])   . Hemiparesis, aphasia, and dysphagia as late effect of cerebrovascular accident (CVA) ([place])   . Acute ischemic stroke ([place])   . CVA (cerebral vascular accident) ([place]) 01/14/2015  . CVA (cerebral infarction) 01/14/2015  . Right hemiplegia ([place])   . Right sided weakness 01/13/2015  . Abdominal pain 06/20/2014  . Altered mental status 02/08/2012  . Sleep apnea 12/08/2010   . Morbid obesity ([place]) 12/08/2010  . CHF (congestive heart failure) ([place]) 12/08/2010  . Chronic renal failure 12/08/2010  . TIA (transient ischemic attack) 12/08/2010  . CAD (coronary artery disease) 12/08/2010  . Cellulitis of pubic region 12/01/2010  . Uncontrolled type 2 DM with hyperosmolar nonketotic hyperglycemia ([place]) 12/01/2010   Billie Ruddy, PT, DPT [facility] [address] [place], [place], [address] Phone: [phone]   Fax:  [phone] 05/01/2015, 4:02 PM  Name: Michelle Holmes MRN: [id number] Date of Birth: 1950-04-01

## 2015-05-01 NOTE — Telephone Encounter (Signed)
Dr. Letta Pate,  Completed PT evaluation for Ms. Ginsberg today. Due to limited functional use of RUE, feel that patient would benefit from outpatient OT.  If you agree, please submit an order for outpatient OT.  Thanks so much,  Billie Ruddy, PT, DPT [facility] [address] [place], [place], [address] Phone: [phone]   Fax:  [phone] 05/01/2015, 12:44 PM

## 2015-05-02 ENCOUNTER — Telehealth: Payer: Self-pay | Admitting: *Deleted

## 2015-05-02 NOTE — Addendum Note (Signed)
Addended by: Clydene Laming, AMBER L on: 05/02/2015 10:50 AM   Modules accepted: Orders

## 2015-05-02 NOTE — Telephone Encounter (Signed)
Pt was seen by [facility] in January, home health requested a transfer bench, faxed a statement of medical necessity, it was signed by Dr., Letta Pate and faxed back. [employer], is now requesting a certificate of medical necessity to be signed, dated, and faxed to [phone]. The certificate was located and placed in Dr. Letta Pate pile to be signed.

## 2015-05-02 NOTE — Telephone Encounter (Signed)
Okay 

## 2015-05-02 NOTE — Telephone Encounter (Signed)
Please order outpatient OT

## 2015-05-02 NOTE — Telephone Encounter (Signed)
Order placed. Michelle Holmes aware.

## 2015-05-05 ENCOUNTER — Encounter (HOSPITAL_COMMUNITY): Payer: Self-pay | Admitting: Emergency Medicine

## 2015-05-05 ENCOUNTER — Emergency Department (HOSPITAL_COMMUNITY): Payer: Medicare Other

## 2015-05-05 ENCOUNTER — Observation Stay (HOSPITAL_COMMUNITY)
Admission: EM | Admit: 2015-05-05 | Discharge: 2015-05-07 | Disposition: A | Discharge status: Home / Self Care | Payer: Medicare Other | Attending: Cardiology | Admitting: Cardiology

## 2015-05-05 DIAGNOSIS — E7800 Pure hypercholesterolemia, unspecified: Secondary | ICD-10-CM | POA: Diagnosis not present

## 2015-05-05 DIAGNOSIS — G459 Transient cerebral ischemic attack, unspecified: Secondary | ICD-10-CM | POA: Diagnosis not present

## 2015-05-05 DIAGNOSIS — N184 Chronic kidney disease, stage 4 (severe): Secondary | ICD-10-CM | POA: Diagnosis not present

## 2015-05-05 DIAGNOSIS — D638 Anemia in other chronic diseases classified elsewhere: Secondary | ICD-10-CM | POA: Insufficient documentation

## 2015-05-05 DIAGNOSIS — Z87891 Personal history of nicotine dependence: Secondary | ICD-10-CM | POA: Diagnosis not present

## 2015-05-05 DIAGNOSIS — Z7951 Long term (current) use of inhaled steroids: Secondary | ICD-10-CM | POA: Insufficient documentation

## 2015-05-05 DIAGNOSIS — Z7902 Long term (current) use of antithrombotics/antiplatelets: Secondary | ICD-10-CM | POA: Insufficient documentation

## 2015-05-05 DIAGNOSIS — R51 Headache: Secondary | ICD-10-CM | POA: Diagnosis present

## 2015-05-05 DIAGNOSIS — Z79899 Other long term (current) drug therapy: Secondary | ICD-10-CM | POA: Diagnosis not present

## 2015-05-05 DIAGNOSIS — I69351 Hemiplegia and hemiparesis following cerebral infarction affecting right dominant side: Secondary | ICD-10-CM | POA: Insufficient documentation

## 2015-05-05 DIAGNOSIS — J449 Chronic obstructive pulmonary disease, unspecified: Secondary | ICD-10-CM | POA: Insufficient documentation

## 2015-05-05 DIAGNOSIS — R350 Frequency of micturition: Secondary | ICD-10-CM | POA: Insufficient documentation

## 2015-05-05 DIAGNOSIS — E1122 Type 2 diabetes mellitus with diabetic chronic kidney disease: Secondary | ICD-10-CM | POA: Insufficient documentation

## 2015-05-05 DIAGNOSIS — I509 Heart failure, unspecified: Secondary | ICD-10-CM | POA: Insufficient documentation

## 2015-05-05 DIAGNOSIS — Z955 Presence of coronary angioplasty implant and graft: Secondary | ICD-10-CM | POA: Diagnosis not present

## 2015-05-05 DIAGNOSIS — I130 Hypertensive heart and chronic kidney disease with heart failure and stage 1 through stage 4 chronic kidney disease, or unspecified chronic kidney disease: Secondary | ICD-10-CM | POA: Diagnosis not present

## 2015-05-05 DIAGNOSIS — I2510 Atherosclerotic heart disease of native coronary artery without angina pectoris: Secondary | ICD-10-CM | POA: Insufficient documentation

## 2015-05-05 DIAGNOSIS — I252 Old myocardial infarction: Secondary | ICD-10-CM | POA: Diagnosis not present

## 2015-05-05 DIAGNOSIS — E662 Morbid (severe) obesity with alveolar hypoventilation: Secondary | ICD-10-CM | POA: Insufficient documentation

## 2015-05-05 DIAGNOSIS — Z8673 Personal history of transient ischemic attack (TIA), and cerebral infarction without residual deficits: Secondary | ICD-10-CM

## 2015-05-05 DIAGNOSIS — Z794 Long term (current) use of insulin: Secondary | ICD-10-CM | POA: Insufficient documentation

## 2015-05-05 DIAGNOSIS — Z85038 Personal history of other malignant neoplasm of large intestine: Secondary | ICD-10-CM | POA: Insufficient documentation

## 2015-05-05 LAB — COMPREHENSIVE METABOLIC PANEL
ALT: 10 U/L — ABNORMAL LOW (ref 14–54)
AST: 13 U/L — ABNORMAL LOW (ref 15–41)
Albumin: 3.4 g/dL — ABNORMAL LOW (ref 3.5–5.0)
Alkaline Phosphatase: 63 U/L (ref 38–126)
Anion gap: 11 (ref 5–15)
BUN: 29 mg/dL — ABNORMAL HIGH (ref 6–20)
CO2: 18 mmol/L — ABNORMAL LOW (ref 22–32)
Calcium: 9.2 mg/dL (ref 8.9–10.3)
Chloride: 111 mmol/L (ref 101–111)
Creatinine, Ser: 2.33 mg/dL — ABNORMAL HIGH (ref 0.44–1.00)
GFR calc Af Amer: 24 mL/min — ABNORMAL LOW (ref >60)
GFR calc non Af Amer: 21 mL/min — ABNORMAL LOW (ref >60)
Glucose, Bld: 204 mg/dL — ABNORMAL HIGH (ref 65–99)
Potassium: 3.7 mmol/L (ref 3.5–5.1)
Sodium: 140 mmol/L (ref 135–145)
Total Bilirubin: 0.6 mg/dL (ref 0.3–1.2)
Total Protein: 6.7 g/dL (ref 6.5–8.1)

## 2015-05-05 LAB — I-STAT CHEM 8, ED
BUN: 32 mg/dL — ABNORMAL HIGH (ref 6–20)
Calcium, Ion: 1.16 mmol/L (ref 1.13–1.30)
Chloride: 109 mmol/L (ref 101–111)
Creatinine, Ser: 2.2 mg/dL — ABNORMAL HIGH (ref 0.44–1.00)
Glucose, Bld: 199 mg/dL — ABNORMAL HIGH (ref 65–99)
HCT: 31 % — ABNORMAL LOW (ref 36.0–46.0)
Hemoglobin: 10.5 g/dL — ABNORMAL LOW (ref 12.0–15.0)
Potassium: 3.7 mmol/L (ref 3.5–5.1)
Sodium: 142 mmol/L (ref 135–145)
TCO2: 20 mmol/L (ref 0–100)

## 2015-05-05 LAB — DIFFERENTIAL
Basophils Absolute: 0 10*3/uL (ref 0.0–0.1)
Basophils Relative: 0 %
Eosinophils Absolute: 0.2 10*3/uL (ref 0.0–0.7)
Eosinophils Relative: 2 %
Lymphocytes Relative: 15 %
Lymphs Abs: 1.5 10*3/uL (ref 0.7–4.0)
Monocytes Absolute: 0.6 10*3/uL (ref 0.1–1.0)
Monocytes Relative: 6 %
Neutro Abs: 7.6 10*3/uL (ref 1.7–7.7)
Neutrophils Relative %: 77 %

## 2015-05-05 LAB — CBC
HCT: 30.6 % — ABNORMAL LOW (ref 36.0–46.0)
Hemoglobin: 10.3 g/dL — ABNORMAL LOW (ref 12.0–15.0)
MCH: 29.8 pg (ref 26.0–34.0)
MCHC: 33.7 g/dL (ref 30.0–36.0)
MCV: 88.4 fL (ref 78.0–100.0)
Platelets: 160 10*3/uL (ref 150–400)
RBC: 3.46 MIL/uL — ABNORMAL LOW (ref 3.87–5.11)
RDW: 13.9 % (ref 11.5–15.5)
WBC: 9.8 10*3/uL (ref 4.0–10.5)

## 2015-05-05 LAB — I-STAT TROPONIN, ED: Troponin i, poc: 0.01 ng/mL (ref 0.00–0.08)

## 2015-05-05 LAB — PROTIME-INR
INR: 1.14 (ref 0.00–1.49)
Prothrombin Time: 14.8 seconds (ref 11.6–15.2)

## 2015-05-05 LAB — ETHANOL: Alcohol, Ethyl (B): <5 mg/dL (ref <5)

## 2015-05-05 LAB — APTT: aPTT: 23 seconds — ABNORMAL LOW (ref 24–37)

## 2015-05-05 NOTE — ED Notes (Signed)
Pt arrives by [facility] with stroke like symptoms. Pt had a stroke in December 2016. She was at [employer] and at about 1830, family states she c/o headache and became nauseated. Pt vomited in restroom and began having difficulty speaking and lethargy. Pt has baseline right side deficits from previous stroke. EMS placed a 20g in left hand. Last BP 177/110, 12-Lead NSR in 80s, CGB 185.

## 2015-05-05 NOTE — H&P (Signed)
Michelle Holmes is an 65 y.o. female.   Chief Complaint: Headache associated with slurred speech and nausea vomiting HPI: Patient is 65 year old female with past medical history significant for multiple medical problems i.e. 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 Code stroke was called patient had CT of the brain which showed no acute abnormality her speech gradually improved to the baseline. Patient denies any weakness in the arms or legs had right-sided weakness from prior stroke. Denies any seizure activity. Patient denies any fever or chills. Denies any urinary complaints. Denies abdominal pain. States she ate some chips and drank [facility] before vomiting.  Past Medical History  Diagnosis Date  . Emphysema   . Colon cancer ([place])   . Hypertension   . Hypercholesteremia   . Diabetes mellitus   . COPD (chronic obstructive pulmonary disease) ([place])   . Coronary artery disease   . Obesity   . Sleep apnea   . TIA (transient ischemic attack)   . CHF (congestive heart failure) ([place])   . Anemia   . DJD (degenerative joint disease)   . Renal insufficiency   . Chronic back pain   . Scoliosis   . Obesity hypoventilation syndrome ([place])   . Stroke ([place])   . Myocardial infarction [facility])     Past Surgical History  Procedure Laterality Date  . Coronary angioplasty with stent placement    . Colon surgery      History reviewed. No pertinent family history. Social History:  reports that she has quit  smoking. She does not have any smokeless tobacco history on file. She reports that she does not drink alcohol or use illicit drugs.  Allergies:  Allergies  Allergen Reactions  . Penicillins Hives and Swelling    Has patient had a PCN reaction causing immediate rash, facial/tongue/throat swelling, SOB or lightheadedness with hypotension: yes Has patient had a PCN reaction causing severe rash involving mucus membranes or skin necrosis: no Has patient had a PCN reaction that required hospitalization no Has patient had a PCN reaction occurring within the last 10 years: no If all of the above answers are "NO", then may proceed with Cephalosporin use.      (Not in a hospital admission)  Results for orders placed or performed during the hospital encounter of 05/05/15 (from the past 48 hour(s))  Ethanol     Status: None   Collection Time: 05/05/15  7:50 PM  Result Value Ref Range   Alcohol, Ethyl (B) <5 <5 mg/dL    Comment:        LOWEST DETECTABLE LIMIT FOR SERUM ALCOHOL IS 5 mg/dL FOR MEDICAL PURPOSES ONLY   Protime-INR     Status: None   Collection Time: 05/05/15  7:50 PM  Result Value Ref Range   Prothrombin Time 14.8 11.6 - 15.2 seconds   INR 1.14 0.00 - 1.49  APTT     Status: Abnormal   Collection Time: 05/05/15  7:50 PM  Result Value Ref Range   aPTT 23 (L) 24 - 37 seconds  CBC     Status: Abnormal   Collection  Time: 05/05/15  7:50 PM  Result Value Ref Range   WBC 9.8 4.0 - 10.5 K/uL   RBC 3.46 (L) 3.87 - 5.11 MIL/uL   Hemoglobin 10.3 (L) 12.0 - 15.0 g/dL   HCT 30.6 (L) 36.0 - 46.0 %   MCV 88.4 78.0 - 100.0 fL   MCH 29.8 26.0 - 34.0 pg   MCHC 33.7 30.0 - 36.0 g/dL   RDW 13.9 11.5 - 15.5 %   Platelets 160 150 - 400 K/uL  Differential     Status: None   Collection Time: 05/05/15  7:50 PM  Result Value Ref Range   Neutrophils Relative % 77 %   Neutro Abs 7.6 1.7 - 7.7 K/uL   Lymphocytes Relative 15 %   Lymphs Abs 1.5 0.7 - 4.0 K/uL   Monocytes Relative 6 %    Monocytes Absolute 0.6 0.1 - 1.0 K/uL   Eosinophils Relative 2 %   Eosinophils Absolute 0.2 0.0 - 0.7 K/uL   Basophils Relative 0 %   Basophils Absolute 0.0 0.0 - 0.1 K/uL  Comprehensive metabolic panel     Status: Abnormal   Collection Time: 05/05/15  7:50 PM  Result Value Ref Range   Sodium 140 135 - 145 mmol/L   Potassium 3.7 3.5 - 5.1 mmol/L   Chloride 111 101 - 111 mmol/L   CO2 18 (L) 22 - 32 mmol/L   Glucose, Bld 204 (H) 65 - 99 mg/dL   BUN 29 (H) 6 - 20 mg/dL   Creatinine, Ser 2.33 (H) 0.44 - 1.00 mg/dL   Calcium 9.2 8.9 - 10.3 mg/dL   Total Protein 6.7 6.5 - 8.1 g/dL   Albumin 3.4 (L) 3.5 - 5.0 g/dL   AST 13 (L) 15 - 41 U/L   ALT 10 (L) 14 - 54 U/L   Alkaline Phosphatase 63 38 - 126 U/L   Total Bilirubin 0.6 0.3 - 1.2 mg/dL   GFR calc non Af Amer 21 (L) >60 mL/min   GFR calc Af Amer 24 (L) >60 mL/min    Comment: (NOTE) The eGFR has been calculated using the CKD EPI equation. This calculation has not been validated in all clinical situations. eGFR's persistently <60 mL/min signify possible Chronic Kidney Disease.    Anion gap 11 5 - 15  I-stat troponin, ED (not at [facility], [facility])     Status: None   Collection Time: 05/05/15  7:57 PM  Result Value Ref Range   Troponin i, poc 0.01 0.00 - 0.08 ng/mL   Comment 3            Comment: Due to the release kinetics of cTnI, a negative result within the first hours of the onset of symptoms does not rule out myocardial infarction with certainty. If myocardial infarction is still suspected, repeat the test at appropriate intervals.   I-Stat Chem 8, ED  (not at [facility], [facility])     Status: Abnormal   Collection Time: 05/05/15  8:20 PM  Result Value Ref Range   Sodium 142 135 - 145 mmol/L   Potassium 3.7 3.5 - 5.1 mmol/L   Chloride 109 101 - 111 mmol/L   BUN 32 (H) 6 - 20 mg/dL   Creatinine, Ser 2.20 (H) 0.44 - 1.00 mg/dL   Glucose, Bld 199 (H) 65 - 99 mg/dL   Calcium, Ion 1.16 1.13 - 1.30 mmol/L   TCO2 20 0 - 100 mmol/L    Hemoglobin 10.5 (L) 12.0 - 15.0 g/dL   HCT 31.0 (  L) 36.0 - 46.0 %   Ct Head Wo Contrast  05/05/2015  CLINICAL DATA:  Code stroke. Acute onset of slurred speech and headache. Initial encounter. EXAM: CT HEAD WITHOUT CONTRAST TECHNIQUE: Contiguous axial images were obtained from the base of the skull through the vertex without intravenous contrast. COMPARISON:  CT of the head performed 01/16/2015 FINDINGS: There is no evidence of acute infarction, mass lesion, or intra- or extra-axial hemorrhage on CT. Prominence of the ventricles and sulci reflects mild cortical volume loss. Scattered periventricular and subcortical white matter change likely reflects small vessel ischemic microangiopathy. A small chronic infarct is noted at the left frontoparietal region, with associated encephalomalacia. Small chronic lacunar infarcts are noted at the left basal ganglia and left thalamus. The brainstem and fourth ventricle are within normal limits. No mass effect or midline shift is seen. There is no evidence of fracture; visualized osseous structures are unremarkable in appearance. The orbits are within normal limits. The paranasal sinuses and mastoid air cells are well-aerated. No significant soft tissue abnormalities are seen. IMPRESSION: 1. No acute intracranial pathology seen on CT. 2. Mild cortical volume loss and scattered small vessel ischemic microangiopathy. 3. Small chronic infarct at the left frontoparietal region, with associated encephalomalacia. 4. Small chronic lacunar infarcts at the left basal ganglia and left thalamus. These results were called by telephone at the time of interpretation on 05/05/2015 at 8:05 pm to Dr. Blanchie Dessert, who verbally acknowledged these results. Electronically Signed   By: Garald Balding M.D.   On: 05/05/2015 20:07    Review of Systems  Constitutional: Negative for fever and diaphoresis.  Eyes: Negative for double vision.  Respiratory: Negative for cough, hemoptysis and  shortness of breath.   Cardiovascular: Negative for chest pain, palpitations, claudication and leg swelling.  Gastrointestinal: Positive for nausea and vomiting. Negative for abdominal pain.  Genitourinary: Negative for dysuria.  Neurological: Positive for dizziness and headaches. Negative for seizures, loss of consciousness and weakness.    Blood pressure 151/65, pulse 81, temperature 100 F (37.8 C), temperature source Oral, resp. rate 16, SpO2 95 %. Physical Exam  Constitutional: She is oriented to person, place, and time.  HENT:  Head: Normocephalic and atraumatic.  Eyes: Conjunctivae are normal. Pupils are equal, round, and reactive to light. Left eye exhibits no discharge.  Neck: Normal range of motion. Neck supple. No JVD present. No tracheal deviation present. No thyromegaly present.  Cardiovascular: Normal rate and regular rhythm.   Murmur (Soft systolic murmur noted no S3 gallop) heard. Respiratory: Effort normal and breath sounds normal. No respiratory distress. She has no wheezes.  GI: Soft. Bowel sounds are normal. She exhibits no distension. There is no tenderness. There is no rebound.  Neurological: She is alert and oriented to person, place, and time.  Slurred speech noted back to baseline Right-sided paresis noted     Assessment/Plan Status post TIA rule out CVA Rule out seizures History of left CVA) in the past Coronary artery disease history of PCI to LAD in the past Hypertension Compensated congestive heart failure secondary to preserved LV systolic function Insulin requiring diabetes mellitus Morbid obesity Obesity hypoventilation syndrome Obstructive sleep apnea Anemia of chronic disease Chronic kidney disease stage IV History of scoliosis Degenerative joint disease Plan As per orders   Charolette Forward, MD 05/05/2015, 10:38 PM

## 2015-05-05 NOTE — Code Documentation (Signed)
Michelle Holmes is a 65yo bf presenting to the [facility] via [facility] for sudden onset HA, N/V, and slurred speech at 1830 while at church.  She has a hx of stroke in 12/2014 that initially presented with similar symptoms and Rt side weakness that has persisted.  By her arrival to [facility] her symptoms had improved and the HA had resolved.  NIH 11 with a baseline Rt side weakness and facial droop.  She currently has minimal movement on the Rt and depends on help to transfer to her wheelchair for mobility.  She is in PT for assistance with mobility.

## 2015-05-05 NOTE — Consult Note (Addendum)
Admission H&P    Chief Complaint: Acute onset of headache, nausea and vomiting and slurred speech.  HPI: Michelle Holmes is an 65 y.o. female with a history of stroke in December 2016 with residual right hemiparesis, hypertension, hyperlipidemia and diabetes mellitus, brought to the emergency room following acute onset of headache and nausea and vomiting as well as slurring of speech and, at one point, difficulty with speech output. Patient's deficits improved in route to the hospital. Headache resolved. CT scan of her head showed no acute intracranial abnormality. She's been taking aspirin and Plavix daily. NIH stroke score was 11. Deficits were felt to largely be residual from previous stroke. Patient felt she had returned to baseline level of functioning. She has been wheelchair-bound since her stroke.  LSN: 6:30 PM on 05/05/2015 tPA Given: No: New deficits rapidly improved mRankin:  Past Medical History  Diagnosis Date  . Emphysema   . Colon cancer ([place])   . Hypertension   . Hypercholesteremia   . Diabetes mellitus   . COPD (chronic obstructive pulmonary disease) ([place])   . Coronary artery disease   . Obesity   . Sleep apnea   . TIA (transient ischemic attack)   . CHF (congestive heart failure) ([place])   . Anemia   . DJD (degenerative joint disease)   . Renal insufficiency   . Chronic back pain   . Scoliosis   . Obesity hypoventilation syndrome ([place])   . Stroke ([place])   . Myocardial infarction [facility])     Past Surgical History  Procedure Laterality Date  . Coronary angioplasty with stent placement    . Colon surgery      History reviewed. No pertinent family history. Social History:  reports that she has quit smoking. She does not have any smokeless tobacco history on file. She reports that she does not drink alcohol or use illicit drugs.  Allergies:  Allergies  Allergen Reactions  . Penicillins Hives and Swelling    Has patient had a PCN reaction causing immediate rash,  facial/tongue/throat swelling, SOB or lightheadedness with hypotension: yes Has patient had a PCN reaction causing severe rash involving mucus membranes or skin necrosis: no Has patient had a PCN reaction that required hospitalization no Has patient had a PCN reaction occurring within the last 10 years: no If all of the above answers are "NO", then may proceed with Cephalosporin use.    Medications: Patient's preadmission medications were reviewed by me.  ROS: History obtained from the patient  General ROS: negative for - chills, fatigue, fever, night sweats, weight gain or weight loss Psychological ROS: negative for - behavioral disorder, hallucinations, memory difficulties, mood swings or suicidal ideation Ophthalmic ROS: negative for - blurry vision, double vision, eye pain or loss of vision ENT ROS: negative for - epistaxis, nasal discharge, oral lesions, sore throat, tinnitus or vertigo Allergy and Immunology ROS: negative for - hives or itchy/watery eyes Hematological and Lymphatic ROS: negative for - bleeding problems, bruising or swollen lymph nodes Endocrine ROS: negative for - galactorrhea, hair pattern changes, polydipsia/polyuria or temperature intolerance Respiratory ROS: negative for - cough, hemoptysis, shortness of breath or wheezing Cardiovascular ROS: negative for - chest pain, dyspnea on exertion, edema or irregular heartbeat Gastrointestinal ROS: negative for - abdominal pain, diarrhea, hematemesis, nausea/vomiting or stool incontinence Genito-Urinary ROS: negative for - dysuria, hematuria, incontinence or urinary frequency/urgency Musculoskeletal ROS: Residual right hemiparesis; wheelchair-bound Neurological ROS: as noted in HPI Dermatological ROS: negative for rash and skin lesion changes  Physical  Examination: Blood pressure 151/65, pulse 81, temperature 100 F (37.8 C), temperature source Oral, resp. rate 16, SpO2 95 %.  HEENT-  Normocephalic, no lesions,  without obvious abnormality.  Normal external eye and conjunctiva.  Normal TM's bilaterally.  Normal auditory canals and external ears. Normal external nose, mucus membranes and septum.  Normal pharynx. Neck supple with no masses, nodes, nodules or enlargement. Cardiovascular - regular rate and rhythm, S1, S2 normal, no murmur, click, rub or gallop Lungs - chest clear, no wheezing, rales, normal symmetric air entry Abdomen - soft, non-tender; bowel sounds normal; no masses,  no organomegaly Extremities - no joint deformities, effusion, or inflammation, mild 1+ edema in distal lower extremities  Neurologic Examination: Mental Status: Alert, oriented, thought content appropriate.  Speech slightly slurred without evidence of aphasia. Able to follow commands without difficulty. Cranial Nerves: II-Visual fields were normal. III/IV/VI-Pupils were equal and reacted. Extraocular movements were full and conjugate.    V/VII-no facial numbness; mild right lower facial weakness. VIII-normal. X-mild dysarthria. XI: trapezius strength/neck flexion strength normal bilaterally XII-midline tongue extension with normal strength. Motor: Severe weakness with moderate increase in tone involving right extremities; normal strength and tone of left extremities Sensory: Normal throughout. Deep Tendon Reflexes: 1+ and symmetric in upper extremities and absent in lower extremities. Plantars: Mute bilaterally Cerebellar: Normal finger-to-nose testing with use of left upper extremity. Carotid auscultation: Normal  Results for orders placed or performed during the hospital encounter of 05/05/15 (from the past 48 hour(s))  Ethanol     Status: None   Collection Time: 05/05/15  7:50 PM  Result Value Ref Range   Alcohol, Ethyl (B) <5 <5 mg/dL    Comment:        LOWEST DETECTABLE LIMIT FOR SERUM ALCOHOL IS 5 mg/dL FOR MEDICAL PURPOSES ONLY   Protime-INR     Status: None   Collection Time: 05/05/15  7:50 PM  Result  Value Ref Range   Prothrombin Time 14.8 11.6 - 15.2 seconds   INR 1.14 0.00 - 1.49  APTT     Status: Abnormal   Collection Time: 05/05/15  7:50 PM  Result Value Ref Range   aPTT 23 (L) 24 - 37 seconds  CBC     Status: Abnormal   Collection Time: 05/05/15  7:50 PM  Result Value Ref Range   WBC 9.8 4.0 - 10.5 K/uL   RBC 3.46 (L) 3.87 - 5.11 MIL/uL   Hemoglobin 10.3 (L) 12.0 - 15.0 g/dL   HCT 30.6 (L) 36.0 - 46.0 %   MCV 88.4 78.0 - 100.0 fL   MCH 29.8 26.0 - 34.0 pg   MCHC 33.7 30.0 - 36.0 g/dL   RDW 13.9 11.5 - 15.5 %   Platelets 160 150 - 400 K/uL  Differential     Status: None   Collection Time: 05/05/15  7:50 PM  Result Value Ref Range   Neutrophils Relative % 77 %   Neutro Abs 7.6 1.7 - 7.7 K/uL   Lymphocytes Relative 15 %   Lymphs Abs 1.5 0.7 - 4.0 K/uL   Monocytes Relative 6 %   Monocytes Absolute 0.6 0.1 - 1.0 K/uL   Eosinophils Relative 2 %   Eosinophils Absolute 0.2 0.0 - 0.7 K/uL   Basophils Relative 0 %   Basophils Absolute 0.0 0.0 - 0.1 K/uL  Comprehensive metabolic panel     Status: Abnormal   Collection Time: 05/05/15  7:50 PM  Result Value Ref Range   Sodium 140 135 -  145 mmol/L   Potassium 3.7 3.5 - 5.1 mmol/L   Chloride 111 101 - 111 mmol/L   CO2 18 (L) 22 - 32 mmol/L   Glucose, Bld 204 (H) 65 - 99 mg/dL   BUN 29 (H) 6 - 20 mg/dL   Creatinine, Ser 2.33 (H) 0.44 - 1.00 mg/dL   Calcium 9.2 8.9 - 10.3 mg/dL   Total Protein 6.7 6.5 - 8.1 g/dL   Albumin 3.4 (L) 3.5 - 5.0 g/dL   AST 13 (L) 15 - 41 U/L   ALT 10 (L) 14 - 54 U/L   Alkaline Phosphatase 63 38 - 126 U/L   Total Bilirubin 0.6 0.3 - 1.2 mg/dL   GFR calc non Af Amer 21 (L) >60 mL/min   GFR calc Af Amer 24 (L) >60 mL/min    Comment: (NOTE) The eGFR has been calculated using the CKD EPI equation. This calculation has not been validated in all clinical situations. eGFR's persistently <60 mL/min signify possible Chronic Kidney Disease.    Anion gap 11 5 - 15  I-stat troponin, ED (not at [facility],  [facility])     Status: None   Collection Time: 05/05/15  7:57 PM  Result Value Ref Range   Troponin i, poc 0.01 0.00 - 0.08 ng/mL   Comment 3            Comment: Due to the release kinetics of cTnI, a negative result within the first hours of the onset of symptoms does not rule out myocardial infarction with certainty. If myocardial infarction is still suspected, repeat the test at appropriate intervals.   I-Stat Chem 8, ED  (not at [facility], [facility])     Status: Abnormal   Collection Time: 05/05/15  8:20 PM  Result Value Ref Range   Sodium 142 135 - 145 mmol/L   Potassium 3.7 3.5 - 5.1 mmol/L   Chloride 109 101 - 111 mmol/L   BUN 32 (H) 6 - 20 mg/dL   Creatinine, Ser 2.20 (H) 0.44 - 1.00 mg/dL   Glucose, Bld 199 (H) 65 - 99 mg/dL   Calcium, Ion 1.16 1.13 - 1.30 mmol/L   TCO2 20 0 - 100 mmol/L   Hemoglobin 10.5 (L) 12.0 - 15.0 g/dL   HCT 31.0 (L) 36.0 - 46.0 %   Ct Head Wo Contrast  05/05/2015  CLINICAL DATA:  Code stroke. Acute onset of slurred speech and headache. Initial encounter. EXAM: CT HEAD WITHOUT CONTRAST TECHNIQUE: Contiguous axial images were obtained from the base of the skull through the vertex without intravenous contrast. COMPARISON:  CT of the head performed 01/16/2015 FINDINGS: There is no evidence of acute infarction, mass lesion, or intra- or extra-axial hemorrhage on CT. Prominence of the ventricles and sulci reflects mild cortical volume loss. Scattered periventricular and subcortical white matter change likely reflects small vessel ischemic microangiopathy. A small chronic infarct is noted at the left frontoparietal region, with associated encephalomalacia. Small chronic lacunar infarcts are noted at the left basal ganglia and left thalamus. The brainstem and fourth ventricle are within normal limits. No mass effect or midline shift is seen. There is no evidence of fracture; visualized osseous structures are unremarkable in appearance. The orbits are within normal limits. The  paranasal sinuses and mastoid air cells are well-aerated. No significant soft tissue abnormalities are seen. IMPRESSION: 1. No acute intracranial pathology seen on CT. 2. Mild cortical volume loss and scattered small vessel ischemic microangiopathy. 3. Small chronic infarct at the left frontoparietal region, with associated  encephalomalacia. 4. Small chronic lacunar infarcts at the left basal ganglia and left thalamus. These results were called by telephone at the time of interpretation on 05/05/2015 at 8:05 pm to Dr. Blanchie Dessert, who verbally acknowledged these results. Electronically Signed   By: Garald Balding M.D.   On: 05/05/2015 20:07    Assessment: 65 y.o. female with multiple risk factors for stroke as well as previous left cerebral infarction presenting with transient speech difficulty with associated headache and nausea. Deficits have returned to baseline at this point TIA or recurrent subcortical infarction cannot be ruled out. As well, partial seizures, new onset cannot be ruled out.  Stroke Risk Factors - diabetes mellitus, hyperlipidemia and hypertension  Plan: 1. HgbA1c, fasting lipid panel 2. MRI, MRA  of the brain without contrast 3. PT consult, OT consult, Speech consult 4. Carotid dopplers 5. Prophylactic therapy-continue aspirin and Plavix 6. Risk factor modification 7. Telemetry monitoring 8. EEG routine about study  C.R. [name] Kindred, MD [facility] [phone]  05/05/2015, 9:05 PM

## 2015-05-06 ENCOUNTER — Inpatient Hospital Stay (HOSPITAL_COMMUNITY): Payer: Medicare Other

## 2015-05-06 ENCOUNTER — Ambulatory Visit: Payer: Medicare Other | Admitting: Physical Therapy

## 2015-05-06 DIAGNOSIS — E1159 Type 2 diabetes mellitus with other circulatory complications: Secondary | ICD-10-CM | POA: Diagnosis not present

## 2015-05-06 DIAGNOSIS — I10 Essential (primary) hypertension: Secondary | ICD-10-CM | POA: Diagnosis not present

## 2015-05-06 DIAGNOSIS — E785 Hyperlipidemia, unspecified: Secondary | ICD-10-CM

## 2015-05-06 DIAGNOSIS — G4733 Obstructive sleep apnea (adult) (pediatric): Secondary | ICD-10-CM

## 2015-05-06 DIAGNOSIS — G459 Transient cerebral ischemic attack, unspecified: Secondary | ICD-10-CM | POA: Diagnosis not present

## 2015-05-06 DIAGNOSIS — R51 Headache: Secondary | ICD-10-CM | POA: Diagnosis not present

## 2015-05-06 LAB — RAPID URINE DRUG SCREEN, HOSP PERFORMED
Amphetamines: NOT DETECTED
Barbiturates: NOT DETECTED
Benzodiazepines: NOT DETECTED
Cocaine: NOT DETECTED
Opiates: NOT DETECTED
Tetrahydrocannabinol: NOT DETECTED

## 2015-05-06 LAB — LIPID PANEL
Cholesterol: 136 mg/dL (ref 0–200)
HDL: 25 mg/dL — ABNORMAL LOW (ref >40)
LDL Cholesterol: 65 mg/dL (ref 0–99)
Total CHOL/HDL Ratio: 5.4 RATIO
Triglycerides: 228 mg/dL — ABNORMAL HIGH (ref <150)
VLDL: 46 mg/dL — ABNORMAL HIGH (ref 0–40)

## 2015-05-06 LAB — URINALYSIS, ROUTINE W REFLEX MICROSCOPIC
Bilirubin Urine: NEGATIVE
Glucose, UA: NEGATIVE mg/dL
Ketones, ur: NEGATIVE mg/dL
Nitrite: NEGATIVE
Protein, ur: NEGATIVE mg/dL
Specific Gravity, Urine: 1.013 (ref 1.005–1.030)
pH: 5 (ref 5.0–8.0)

## 2015-05-06 LAB — COMPREHENSIVE METABOLIC PANEL
ALT: 10 U/L — ABNORMAL LOW (ref 14–54)
AST: 10 U/L — ABNORMAL LOW (ref 15–41)
Albumin: 3 g/dL — ABNORMAL LOW (ref 3.5–5.0)
Alkaline Phosphatase: 54 U/L (ref 38–126)
Anion gap: 11 (ref 5–15)
BUN: 25 mg/dL — ABNORMAL HIGH (ref 6–20)
CO2: 22 mmol/L (ref 22–32)
Calcium: 9 mg/dL (ref 8.9–10.3)
Chloride: 110 mmol/L (ref 101–111)
Creatinine, Ser: 2.32 mg/dL — ABNORMAL HIGH (ref 0.44–1.00)
GFR calc Af Amer: 24 mL/min — ABNORMAL LOW (ref >60)
GFR calc non Af Amer: 21 mL/min — ABNORMAL LOW (ref >60)
Glucose, Bld: 184 mg/dL — ABNORMAL HIGH (ref 65–99)
Potassium: 3.7 mmol/L (ref 3.5–5.1)
Sodium: 143 mmol/L (ref 135–145)
Total Bilirubin: 1.2 mg/dL (ref 0.3–1.2)
Total Protein: 6.2 g/dL — ABNORMAL LOW (ref 6.5–8.1)

## 2015-05-06 LAB — CBC
HCT: 27.4 % — ABNORMAL LOW (ref 36.0–46.0)
Hemoglobin: 9.4 g/dL — ABNORMAL LOW (ref 12.0–15.0)
MCH: 30.3 pg (ref 26.0–34.0)
MCHC: 34.3 g/dL (ref 30.0–36.0)
MCV: 88.4 fL (ref 78.0–100.0)
Platelets: 152 10*3/uL (ref 150–400)
RBC: 3.1 MIL/uL — ABNORMAL LOW (ref 3.87–5.11)
RDW: 13.9 % (ref 11.5–15.5)
WBC: 8.1 10*3/uL (ref 4.0–10.5)

## 2015-05-06 LAB — URINE MICROSCOPIC-ADD ON

## 2015-05-06 LAB — GLUCOSE, CAPILLARY
Glucose-Capillary: 163 mg/dL — ABNORMAL HIGH (ref 65–99)
Glucose-Capillary: 177 mg/dL — ABNORMAL HIGH (ref 65–99)

## 2015-05-06 MED ORDER — INSULIN GLARGINE 100 UNIT/ML ~~LOC~~ SOLN
15.0000 [IU] | Freq: Every day | SUBCUTANEOUS | Status: DC
  Administered 2015-05-06: 15 [IU] via SUBCUTANEOUS
  Filled 2015-05-06 (×2): qty 0.15

## 2015-05-06 MED ORDER — ATORVASTATIN CALCIUM 40 MG PO TABS
40.0000 mg | ORAL_TABLET | Freq: Every day | ORAL | Status: DC
  Administered 2015-05-06: 40 mg via ORAL
  Filled 2015-05-06: qty 1

## 2015-05-06 MED ORDER — PANTOPRAZOLE SODIUM 40 MG PO TBEC
40.0000 mg | DELAYED_RELEASE_TABLET | Freq: Every day | ORAL | Status: DC
  Administered 2015-05-06: 40 mg via ORAL
  Filled 2015-05-06 (×2): qty 1

## 2015-05-06 MED ORDER — SODIUM CHLORIDE 0.9 % IV SOLN
INTRAVENOUS | Status: DC
  Administered 2015-05-06: 11:00:00 via INTRAVENOUS
  Administered 2015-05-07: 50 mL/h via INTRAVENOUS

## 2015-05-06 MED ORDER — HEPARIN SODIUM (PORCINE) 5000 UNIT/ML IJ SOLN
5000.0000 [IU] | Freq: Three times a day (TID) | INTRAMUSCULAR | Status: DC
  Administered 2015-05-06 – 2015-05-07 (×3): 5000 [IU] via SUBCUTANEOUS
  Filled 2015-05-06 (×3): qty 1

## 2015-05-06 MED ORDER — CLOPIDOGREL BISULFATE 75 MG PO TABS
75.0000 mg | ORAL_TABLET | Freq: Every day | ORAL | Status: DC
  Administered 2015-05-06 – 2015-05-07 (×2): 75 mg via ORAL
  Filled 2015-05-06 (×2): qty 1

## 2015-05-06 MED ORDER — FERROUS SULFATE 325 (65 FE) MG PO TABS
325.0000 mg | ORAL_TABLET | Freq: Two times a day (BID) | ORAL | Status: DC
  Administered 2015-05-06 – 2015-05-07 (×3): 325 mg via ORAL
  Filled 2015-05-06 (×3): qty 1

## 2015-05-06 MED ORDER — INSULIN ASPART 100 UNIT/ML ~~LOC~~ SOLN
0.0000 [IU] | Freq: Three times a day (TID) | SUBCUTANEOUS | Status: DC
  Administered 2015-05-06: 2 [IU] via SUBCUTANEOUS
  Administered 2015-05-07: 1 [IU] via SUBCUTANEOUS

## 2015-05-06 MED ORDER — ASPIRIN EC 81 MG PO TBEC
81.0000 mg | DELAYED_RELEASE_TABLET | Freq: Every day | ORAL | Status: DC
  Administered 2015-05-06 – 2015-05-07 (×2): 81 mg via ORAL
  Filled 2015-05-06 (×2): qty 1

## 2015-05-06 MED ORDER — PREGABALIN 50 MG PO CAPS
50.0000 mg | ORAL_CAPSULE | Freq: Two times a day (BID) | ORAL | Status: DC
  Administered 2015-05-06 – 2015-05-07 (×3): 50 mg via ORAL
  Filled 2015-05-06 (×3): qty 1

## 2015-05-06 MED ORDER — FENOFIBRATE 54 MG PO TABS
54.0000 mg | ORAL_TABLET | Freq: Every day | ORAL | Status: DC
  Administered 2015-05-06 – 2015-05-07 (×2): 54 mg via ORAL
  Filled 2015-05-06 (×2): qty 1

## 2015-05-06 MED ORDER — SENNOSIDES-DOCUSATE SODIUM 8.6-50 MG PO TABS: 1.0000 | ORAL_TABLET | Freq: Every evening | ORAL | Status: DC | PRN

## 2015-05-06 MED ORDER — MOMETASONE FURO-FORMOTEROL FUM 200-5 MCG/ACT IN AERO
2.0000 | INHALATION_SPRAY | Freq: Two times a day (BID) | RESPIRATORY_TRACT | Status: DC
Start: 2015-05-06 — End: 2015-05-07
  Administered 2015-05-07: 2 via RESPIRATORY_TRACT
  Filled 2015-05-06: qty 8.8

## 2015-05-06 MED ORDER — STROKE: EARLY STAGES OF RECOVERY BOOK
Freq: Once | Status: AC
  Administered 2015-05-06: 17:00:00
  Filled 2015-05-06: qty 1

## 2015-05-06 NOTE — Progress Notes (Signed)
SLP Cancellation Note  Patient Details Name: Michelle Holmes MRN: [id number] DOB: Mar 03, 1950   Cancelled treatment:       Reason Eval/Treat Not Completed: Fatigue/lethargy limiting ability to participate (Pt not sufficiently alert despite verbal/tactile stim). Will follow and assess as able.   Vinetta Bergamo MA, CCC-SLP 05/06/2015, 3:40 PM

## 2015-05-06 NOTE — Progress Notes (Signed)
Subjective:  Patient denies any further headache or slurring of speech denies any abdominal pain nausea vomiting. Patient and 2-D echo and duplex carotid approximately 4 months ago which were okay.  Objective:  Vital Signs in the last 24 hours: Temp:  [100 F (37.8 C)] 100 F (37.8 C) (04/09 2012) Pulse Rate:  [76-94] 76 (04/10 1100) Resp:  [13-19] 17 (04/10 1100) BP: (113-157)/(52-96) 157/72 mmHg (04/10 1100) SpO2:  [95 %-100 %] 100 % (04/10 1100)  Intake/Output from previous day:   Intake/Output from this shift:    Physical Exam: Neck: no adenopathy, no carotid bruit, no JVD and supple, symmetrical, trachea midline Lungs: clear to auscultation bilaterally Heart: regular rate and rhythm, S1, S2 normal and Soft systolic murmur noted no S3 gallop Abdomen: soft, non-tender; bowel sounds normal; no masses,  no organomegaly Extremities: extremities normal, atraumatic, no cyanosis or edema  Lab Results:  Recent Labs  05/05/15 1950 05/05/15 2020 05/06/15 0831  WBC 9.8  --  8.1  HGB 10.3* 10.5* 9.4*  PLT 160  --  152    Recent Labs  05/05/15 1950 05/05/15 2020 05/06/15 0831  NA 140 142 143  K 3.7 3.7 3.7  CL 111 109 110  CO2 18*  --  22  GLUCOSE 204* 199* 184*  BUN 29* 32* 25*  CREATININE 2.33* 2.20* 2.32*   No results for input(s): TROPONINI in the last 72 hours.  Invalid input(s): CK, MB Hepatic Function Panel  Recent Labs  05/06/15 0831  PROT 6.2*  ALBUMIN 3.0*  AST 10*  ALT 10*  ALKPHOS 54  BILITOT 1.2    Recent Labs  05/06/15 0831  CHOL 136   No results for input(s): PROTIME in the last 72 hours.  Imaging: Imaging results have been reviewed and Ct Head Wo Contrast  05/05/2015  CLINICAL DATA:  Code stroke. Acute onset of slurred speech and headache. Initial encounter. EXAM: CT HEAD WITHOUT CONTRAST TECHNIQUE: Contiguous axial images were obtained from the base of the skull through the vertex without intravenous contrast. COMPARISON:  CT of the  head performed 01/16/2015 FINDINGS: There is no evidence of acute infarction, mass lesion, or intra- or extra-axial hemorrhage on CT. Prominence of the ventricles and sulci reflects mild cortical volume loss. Scattered periventricular and subcortical white matter change likely reflects small vessel ischemic microangiopathy. A small chronic infarct is noted at the left frontoparietal region, with associated encephalomalacia. Small chronic lacunar infarcts are noted at the left basal ganglia and left thalamus. The brainstem and fourth ventricle are within normal limits. No mass effect or midline shift is seen. There is no evidence of fracture; visualized osseous structures are unremarkable in appearance. The orbits are within normal limits. The paranasal sinuses and mastoid air cells are well-aerated. No significant soft tissue abnormalities are seen. IMPRESSION: 1. No acute intracranial pathology seen on CT. 2. Mild cortical volume loss and scattered small vessel ischemic microangiopathy. 3. Small chronic infarct at the left frontoparietal region, with associated encephalomalacia. 4. Small chronic lacunar infarcts at the left basal ganglia and left thalamus. These results were called by telephone at the time of interpretation on 05/05/2015 at 8:05 pm to Dr. Blanchie Dessert, who verbally acknowledged these results. Electronically Signed   By: Garald Balding M.D.   On: 05/05/2015 20:07   Mr Brain Wo Contrast  05/06/2015  CLINICAL DATA:  Acute presentation with headache, slurred speech, nausea and vomiting yesterday. EXAM: MRI HEAD WITHOUT CONTRAST MRA HEAD WITHOUT CONTRAST TECHNIQUE: Multiplanar, multiecho pulse sequences  of the brain and surrounding structures were obtained without intravenous contrast. Angiographic images of the head were obtained using MRA technique without contrast. COMPARISON:  Head CT 05/05/2015 FINDINGS: MRI HEAD FINDINGS Diffusion imaging does not show any acute or subacute infarction. There  are mild chronic small-vessel ischemic changes of the pons. No focal cerebellar insult. Cerebral hemispheres show an old infarction in the left thalamus. Few old lacunar infarctions in the basal ganglia regions. Pronounced chronic small vessel ischemic changes throughout the deep and subcortical white matter. Old left frontal cortical and subcortical infarction. No mass lesion, hemorrhage, hydrocephalus or extra-axial collection. No pituitary mass. No inflammatory sinus disease. No skull or skullbase lesion. MRA HEAD FINDINGS Both internal carotid arteries are widely patent into the brain. No siphon stenosis. The anterior and middle cerebral vessels are patent without proximal stenosis, aneurysm or vascular malformation. More distal branch vessels do show atherosclerotic irregularity. Both vertebral arteries are widely patent to the basilar. No basilar stenosis. Posterior circulation branch vessels are patent. There is pronounced atherosclerosis in both posterior cerebral arteries, including a severe stenosis of the P1 segment on the right. IMPRESSION: No acute intracranial finding. Extensive old ischemic changes throughout the brain as outlined above. Intracranial MR angiography does not show any large vessel occlusion. Moderate atherosclerotic change of the more distal branch vessels in the anterior circulation. Severe atherosclerotic disease affecting both posterior cerebral arteries, including a severe stenosis at the P1 segment on the right. Electronically Signed   By: Nelson Chimes M.D.   On: 05/06/2015 10:32   Mr Jodene Nam Head/brain Wo Cm  05/06/2015  CLINICAL DATA:  Acute presentation with headache, slurred speech, nausea and vomiting yesterday. EXAM: MRI HEAD WITHOUT CONTRAST MRA HEAD WITHOUT CONTRAST TECHNIQUE: Multiplanar, multiecho pulse sequences of the brain and surrounding structures were obtained without intravenous contrast. Angiographic images of the head were obtained using MRA technique without  contrast. COMPARISON:  Head CT 05/05/2015 FINDINGS: MRI HEAD FINDINGS Diffusion imaging does not show any acute or subacute infarction. There are mild chronic small-vessel ischemic changes of the pons. No focal cerebellar insult. Cerebral hemispheres show an old infarction in the left thalamus. Few old lacunar infarctions in the basal ganglia regions. Pronounced chronic small vessel ischemic changes throughout the deep and subcortical white matter. Old left frontal cortical and subcortical infarction. No mass lesion, hemorrhage, hydrocephalus or extra-axial collection. No pituitary mass. No inflammatory sinus disease. No skull or skullbase lesion. MRA HEAD FINDINGS Both internal carotid arteries are widely patent into the brain. No siphon stenosis. The anterior and middle cerebral vessels are patent without proximal stenosis, aneurysm or vascular malformation. More distal branch vessels do show atherosclerotic irregularity. Both vertebral arteries are widely patent to the basilar. No basilar stenosis. Posterior circulation branch vessels are patent. There is pronounced atherosclerosis in both posterior cerebral arteries, including a severe stenosis of the P1 segment on the right. IMPRESSION: No acute intracranial finding. Extensive old ischemic changes throughout the brain as outlined above. Intracranial MR angiography does not show any large vessel occlusion. Moderate atherosclerotic change of the more distal branch vessels in the anterior circulation. Severe atherosclerotic disease affecting both posterior cerebral arteries, including a severe stenosis at the P1 segment on the right. Electronically Signed   By: Nelson Chimes M.D.   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  LOS: 1 day    Charolette Forward 05/06/2015, 12:11 PM

## 2015-05-06 NOTE — Progress Notes (Signed)
OT Cancellation Note  Patient Details Name: Michelle Holmes MRN: [id number] DOB: Nov 26, 1950   Cancelled Treatment:    Reason Eval/Treat Not Completed: Patient declined, no reason specified. Pt yelling out in pain on OT arrival and reported having shooting pain in her R arm. Assisted pt with readjusting in the bed and placing pillow behind R shoulder. Pt quickly fell asleep and when woken by OT to complete evaluation, pt became very agitated and yelled "Not now!" and refused to participate in the evaluation further. Will attempt to complete at a later time.  Redmond Baseman, OTR/L Pager: [phone] 05/06/2015, 2:56 PM

## 2015-05-06 NOTE — Care Management Note (Signed)
Case Management Note  Patient Details  Name: Michelle Holmes MRN: [id number] Date of Birth: 03-28-1950  Subjective/Objective:                    Action/Plan: Patient presented with TIA. Will follow for discharge needs pending PT/OT evals and physician orders.  Expected Discharge Date:                  Expected Discharge Plan:     In-House Referral:     Discharge planning Services     Post Acute Care Choice:    Choice offered to:     DME Arranged:    DME Agency:     HH Arranged:    HH Agency:     Status of Service:  In process, will continue to follow  Medicare Important Message Given:    Date Medicare IM Given:    Medicare IM give by:    Date Additional Medicare IM Given:    Additional Medicare Important Message give by:     If discussed at [place] of Stay Meetings, dates discussed:    Additional Comments:  Rolm Baptise, RN 05/06/2015, 1:57 PM [phone]

## 2015-05-06 NOTE — ED Provider Notes (Signed)
CSN: [id number]     Arrival date & time 05/05/15  1946 History   First MD Initiated Contact with Patient 05/05/15 2051     Chief Complaint  Patient presents with  . Code Stroke    @EDPCLEARED @ (Consider location/radiation/quality/duration/timing/severity/associated sxs/prior Treatment) Patient is a 65 y.o. female presenting with headaches. The history is provided by the patient and the EMS personnel.  Headache Pain location:  Generalized Quality:  Dull Radiates to:  Does not radiate Onset quality:  Sudden Timing:  Constant Chronicity:  New Associated symptoms: numbness and weakness   Associated symptoms: no abdominal pain, no back pain, no congestion, no cough and no fever     Past Medical History  Diagnosis Date  . Emphysema   . Colon cancer ([place])   . Hypertension   . Hypercholesteremia   . Diabetes mellitus   . COPD (chronic obstructive pulmonary disease) ([place])   . Coronary artery disease   . Obesity   . Sleep apnea   . TIA (transient ischemic attack)   . CHF (congestive heart failure) ([place])   . Anemia   . DJD (degenerative joint disease)   . Renal insufficiency   . Chronic back pain   . Scoliosis   . Obesity hypoventilation syndrome ([place])   . Stroke ([place])   . Myocardial infarction [facility])    Past Surgical History  Procedure Laterality Date  . Coronary angioplasty with stent placement    . Colon surgery     History reviewed. No pertinent family history. Social History  Substance Use Topics  . Smoking status: Former Research scientist (life sciences)  . Smokeless tobacco: None  . Alcohol Use: No   OB History    No data available     Review of Systems  Constitutional: Negative for fever.  HENT: Negative for congestion and facial swelling.   Eyes: Negative for discharge and redness.  Respiratory: Negative for cough and shortness of breath.   Cardiovascular: Negative for chest pain.  Gastrointestinal: Negative for abdominal pain and abdominal distention.  Endocrine: Negative for  polydipsia.  Genitourinary: Negative for dysuria.  Musculoskeletal: Negative for back pain.  Skin: Negative for wound.  Neurological: Positive for speech difficulty, weakness, numbness and headaches.  All other systems reviewed and are negative.     Allergies  Penicillins  Home Medications   Prior to Admission medications   Medication Sig Start Date End Date Taking? Authorizing Provider  ADVAIR DISKUS 250-50 MCG/DOSE AEPB Inhale 1 puff into the lungs 2 (two) times daily.  08/22/14  Yes Historical Provider, MD  aspirin EC 81 MG EC tablet Take 1 tablet (81 mg total) by mouth daily. 06/22/14   Charolette Forward, MD  atorvastatin (LIPITOR) 40 MG tablet Take 1 tablet (40 mg total) by mouth daily. 02/07/15   Lavon Paganini Angiulli, PA-C  clopidogrel (PLAVIX) 75 MG tablet Take 1 tablet (75 mg total) by mouth daily. 02/07/15   Lavon Paganini Angiulli, PA-C  fenofibrate 54 MG tablet Take 1 tablet (54 mg total) by mouth daily. 02/07/15   Lavon Paganini Angiulli, PA-C  ferrous sulfate 325 (65 FE) MG tablet Take 1 tablet (325 mg total) by mouth 2 (two) times daily with a meal. 02/07/15   Lavon Paganini Angiulli, PA-C  glipiZIDE (GLUCOTROL) 5 MG tablet Take 1 tablet (5 mg total) by mouth daily before breakfast. 02/07/15   Lavon Paganini Angiulli, PA-C  insulin glargine (LANTUS) 100 UNIT/ML injection Inject 0.3 mLs (30 Units total) into the skin at bedtime. 02/07/15   Lavon Paganini  Angiulli, PA-C  LYRICA 50 MG capsule Take 1 capsule (50 mg total) by mouth 2 (two) times daily. 02/07/15   Lavon Paganini Angiulli, PA-C  ondansetron (ZOFRAN) 4 MG tablet Take 1 tablet (4 mg total) by mouth every 6 (six) hours. 03/21/15   Virgel Manifold, MD  oxyCODONE-acetaminophen (PERCOCET/ROXICET) 5-325 MG tablet Take 1-2 tablets by mouth every 4 (four) hours as needed for severe pain. 03/21/15   Virgel Manifold, MD  pantoprazole (PROTONIX) 40 MG tablet Take 1 tablet (40 mg total) by mouth daily. 02/07/15   Daniel J Angiulli, PA-C   BP 151/65 mmHg  Pulse 81  Temp(Src) 100 F  (37.8 C) (Oral)  Resp 16  SpO2 95% Physical Exam  Constitutional: She appears well-developed and well-nourished.  HENT:  Head: Normocephalic and atraumatic.  Neck: Normal range of motion.  Cardiovascular: Normal rate and regular rhythm.   Pulmonary/Chest: No stridor. No respiratory distress.  Abdominal: She exhibits no distension.  Musculoskeletal: She exhibits no edema or tenderness.  Neurological: She is alert. She exhibits abnormal muscle tone.  Decreased right sided strength Slurred speech Right facial droop  Nursing note and vitals reviewed.   ED Course  Procedures (including critical care time) Labs Review Labs Reviewed  APTT - Abnormal; Notable for the following:    aPTT 23 (*)    All other components within normal limits  CBC - Abnormal; Notable for the following:    RBC 3.46 (*)    Hemoglobin 10.3 (*)    HCT 30.6 (*)    All other components within normal limits  COMPREHENSIVE METABOLIC PANEL - Abnormal; Notable for the following:    CO2 18 (*)    Glucose, Bld 204 (*)    BUN 29 (*)    Creatinine, Ser 2.33 (*)    Albumin 3.4 (*)    AST 13 (*)    ALT 10 (*)    GFR calc non Af Amer 21 (*)    GFR calc Af Amer 24 (*)    All other components within normal limits  I-STAT CHEM 8, ED - Abnormal; Notable for the following:    BUN 32 (*)    Creatinine, Ser 2.20 (*)    Glucose, Bld 199 (*)    Hemoglobin 10.5 (*)    HCT 31.0 (*)    All other components within normal limits  ETHANOL  PROTIME-INR  DIFFERENTIAL  URINE RAPID DRUG SCREEN, HOSP PERFORMED  URINALYSIS, ROUTINE W REFLEX MICROSCOPIC (NOT AT [facility])  I-STAT TROPOININ, ED    Imaging Review Ct Head Wo Contrast  05/05/2015  CLINICAL DATA:  Code stroke. Acute onset of slurred speech and headache. Initial encounter. EXAM: CT HEAD WITHOUT CONTRAST TECHNIQUE: Contiguous axial images were obtained from the base of the skull through the vertex without intravenous contrast. COMPARISON:  CT of the head performed  01/16/2015 FINDINGS: There is no evidence of acute infarction, mass lesion, or intra- or extra-axial hemorrhage on CT. Prominence of the ventricles and sulci reflects mild cortical volume loss. Scattered periventricular and subcortical white matter change likely reflects small vessel ischemic microangiopathy. A small chronic infarct is noted at the left frontoparietal region, with associated encephalomalacia. Small chronic lacunar infarcts are noted at the left basal ganglia and left thalamus. The brainstem and fourth ventricle are within normal limits. No mass effect or midline shift is seen. There is no evidence of fracture; visualized osseous structures are unremarkable in appearance. The orbits are within normal limits. The paranasal sinuses and mastoid air cells are  well-aerated. No significant soft tissue abnormalities are seen. IMPRESSION: 1. No acute intracranial pathology seen on CT. 2. Mild cortical volume loss and scattered small vessel ischemic microangiopathy. 3. Small chronic infarct at the left frontoparietal region, with associated encephalomalacia. 4. Small chronic lacunar infarcts at the left basal ganglia and left thalamus. These results were called by telephone at the time of interpretation on 05/05/2015 at 8:05 pm to Dr. Blanchie Dessert, who verbally acknowledged these results. Electronically Signed   By: Garald Balding M.D.   On: 05/05/2015 20:07   I have personally reviewed and evaluated these images and lab results as part of my medical decision-making.   EKG Interpretation None      MDM   Final diagnoses:  None    65 yo F w/ likely TIA vs stroke reactivation. No tPA 2/2 resolving symptoms. Admitted to medicine for MRI and further workup.     Merrily Pew, MD 05/06/15 [phone]

## 2015-05-06 NOTE — Progress Notes (Signed)
STROKE TEAM PROGRESS NOTE   HISTORY OF PRESENT ILLNESS Michelle Holmes is an 65 y.o. female with a history of stroke in December 2016 with residual right hemiparesis, hypertension, hyperlipidemia and diabetes mellitus, brought to the emergency room following acute onset of headache and nausea and vomiting as well as slurring of speech and, at one point, difficulty with speech output. Patient's deficits improved in route to the hospital. Headache resolved. CT scan of her head showed no acute intracranial abnormality. She's been taking aspirin and Plavix daily. NIH stroke score was 11. Deficits were felt to largely be residual from previous stroke. Patient felt she had returned to baseline level of functioning. She has been wheelchair-bound since her stroke. She was last known well at 6:30 PM on 05/05/2015. Patient was not administered IV t-PA secondary to rapidly resolving deficits. She was admitted for further evaluation and treatment.   SUBJECTIVE (INTERVAL HISTORY) Her daughters are at the bedside.  Overall she feels her condition is completely resolved. She still has sleepiness but stated that her HA and N/V as well speech difficulty have resolved. She has no HA now and just wants to go to sleep. She still has right sided weakness since her stroke in 12/2014 and requesting wheelchair at home.    OBJECTIVE Temp:  [100 F (37.8 C)] 100 F (37.8 C) (04/09 2012) Pulse Rate:  [76-94] 84 (04/10 1304) Cardiac Rhythm:  [-] Normal sinus rhythm (04/10 0843) Resp:  [13-19] 16 (04/10 1304) BP: (113-164)/(52-96) 164/75 mmHg (04/10 1304) SpO2:  [95 %-100 %] 100 % (04/10 1304)  CBC:   Recent Labs Lab 05/05/15 1950 05/05/15 2020 05/06/15 0831  WBC 9.8  --  8.1  NEUTROABS 7.6  --   --   HGB 10.3* 10.5* 9.4*  HCT 30.6* 31.0* 27.4*  MCV 88.4  --  88.4  PLT 160  --  0000000    Basic Metabolic Panel:   Recent Labs Lab 05/05/15 1950 05/05/15 2020 05/06/15 0831  NA 140 142 143  K 3.7 3.7 3.7  CL 111  109 110  CO2 18*  --  22  GLUCOSE 204* 199* 184*  BUN 29* 32* 25*  CREATININE 2.33* 2.20* 2.32*  CALCIUM 9.2  --  9.0    Lipid Panel:     Component Value Date/Time   CHOL 136 05/06/2015 0831   TRIG 228* 05/06/2015 0831   HDL 25* 05/06/2015 0831   CHOLHDL 5.4 05/06/2015 0831   VLDL 46* 05/06/2015 0831   LDLCALC 65 05/06/2015 0831   HgbA1c:  Lab Results  Component Value Date   HGBA1C 13.9* 01/15/2015   Urine Drug Screen:     Component Value Date/Time   LABOPIA NONE DETECTED 05/06/2015 0130   COCAINSCRNUR NONE DETECTED 05/06/2015 0130   LABBENZ NONE DETECTED 05/06/2015 0130   AMPHETMU NONE DETECTED 05/06/2015 0130   THCU NONE DETECTED 05/06/2015 0130   LABBARB NONE DETECTED 05/06/2015 0130      IMAGING I have personally reviewed the radiological images below and agree with the radiology interpretations.  Ct Head Wo Contrast  05/05/2015  IMPRESSION: 1. No acute intracranial pathology seen on CT. 2. Mild cortical volume loss and scattered small vessel ischemic microangiopathy. 3. Small chronic infarct at the left frontoparietal region, with associated encephalomalacia. 4. Small chronic lacunar infarcts at the left basal ganglia and left thalamus.   Mr Brain Wo Contrast  05/06/2015  IMPRESSION: No acute intracranial finding. Extensive old ischemic changes throughout the brain as outlined above. Intracranial MR  angiography does not show any large vessel occlusion. Moderate atherosclerotic change of the more distal branch vessels in the anterior circulation. Severe atherosclerotic disease affecting both posterior cerebral arteries, including a severe stenosis at the P1 segment on the right.   2D echo 01/14/15 - - Left ventricle: The cavity size was normal. Wall thickness was  increased in a pattern of moderate LVH. Systolic function was  normal. The estimated ejection fraction was in the range of 55%  to 60%. Wall motion was normal; there were no regional wall  motion  abnormalities. Doppler parameters are consistent with  abnormal left ventricular relaxation (grade 1 diastolic  dysfunction). - Atrial septum: No defect or patent foramen ovale was identified.  CUS 01/14/15 - No significant plaque or stenosis at either carotid bifurcations. Bilateral vertebral artery flow is antegrade.   PHYSICAL EXAM  Temp:  [100 F (37.8 C)] 100 F (37.8 C) (04/09 2012) Pulse Rate:  [76-94] 84 (04/10 1304) Resp:  [13-19] 16 (04/10 1304) BP: (113-164)/(52-96) 164/75 mmHg (04/10 1304) SpO2:  [95 %-100 %] 100 % (04/10 1304)  General - Well nourished, well developed, sleepy.  Ophthalmologic - Fundi not visualized due to noncooperation.  Cardiovascular - Regular rate and rhythm.  Mental Status -  Level of arousal and orientation to place was intact, but not to time and person. Language including expression, naming, repetition, comprehension was assessed and found intact, mild dysarthria.  Cranial Nerves II - XII - II - Visual field intact OU. III, IV, VI - Extraocular movements intact. V - Facial sensation intact bilaterally. VII - right mild nasolabial fold flattening. VIII - Hearing & vestibular intact bilaterally. X - Palate elevates symmetrically. XI - Chin turning & shoulder shrug intact bilaterally. XII - Tongue protrusion to the right  Motor Strength - The patient's strength was normal LUE and LLE, but 2/5 RUE with significantly increased muscle tone and in elbow flexion position and 3/5 RLE with mildly increased muscle tone. Bulk was normal and fasciculations were absent.    Reflexes - The patient's reflexes were 3+ in RUE and RLE extremities and she had no pathological reflexes.  Sensory -  Not cooperative on exam.    Coordination - not cooperative on exam due to sleepiness.  Tremor was absent.  Gait and Station - not tested due to weakness.   ASSESSMENT/PLAN Ms. Michelle SCHWIEBERT is a 65 y.o. female with history of prior stroke in December 2016  with residual right hemiparesis, hypertension, hyperlipidemia and diabetes presenting with acute onset headache, nausea, vomiting, and slurred speech. She did not receive IV t-PA due to rapidly resolving deficits.   HA episode vs. TIA   Resultant  Symptoms resolved  MRI  No acute infarct  MRA  Severe b/l PCA stenosis and left MCA branch athero  Carotid Doppler  01/14/15 - negative  2D Echo  01/14/15 EF 55-60%  LDL 65  HgbA1c pending  Heparin subq for VTE prophylaxis Diet heart healthy/carb modified Room service appropriate?: Yes; Fluid consistency:: Thin  aspirin 81 mg daily and clopidogrel 75 mg daily prior to admission, now on aspirin 81 mg daily and clopidogrel 75 mg daily  Patient counseled to be compliant with her antithrombotic medications  Ongoing aggressive stroke risk factor management  Therapy recommendations:  pending  Disposition:  pending  Hypertension  Mildly high Permissive hypertension (OK if < 220/120) but gradually normalize in 5-7 days  Hyperlipidemia  Home meds:  lipitor 40 mg daily, resumed in hospital  LDL 65, goal <  56  Continue statin at discharge  Diabetes  HgbA1c pending, goal < 7.0  Controlled  On lantus  On SSI  Other Stroke Risk Factors  Advanced age  Obesity  Hx stroke/TIA  12/2014 left brain subcortical infarctsfrom small vessel disease  01/2012 possible tiny punctate R lower cerebellar infarct, incidental not related to presenting sx  Coronary artery disease  Hx CHF due to diastolic dysfunction  CKD stage IV - Cre 2.32  Other Active Problems  Mild anemia  Hospital day # 1  Neurology will sign off. Please call with questions. Pt will follow up with Dr. Leonie Man at [facility] in about 2 months. She has been seen by Dr. Leonie Man in 12/2014 for acute stroke and asked for follow up with Dr. Leonie Man in 2 months but she did not make an appointment. Thanks for the consult.  Rosalin Hawking, MD PhD Stroke Neurology 05/06/2015 3:06  PM    To contact Stroke Continuity provider, please refer to [email or web address] After hours, contact General Neurology

## 2015-05-07 ENCOUNTER — Ambulatory Visit: Payer: Medicare Other | Admitting: Physical Therapy

## 2015-05-07 DIAGNOSIS — G459 Transient cerebral ischemic attack, unspecified: Secondary | ICD-10-CM | POA: Diagnosis not present

## 2015-05-07 LAB — HEMOGLOBIN A1C
Hgb A1c MFr Bld: 7.6 % — ABNORMAL HIGH (ref 4.8–5.6)
Mean Plasma Glucose: 171 mg/dL

## 2015-05-07 LAB — GLUCOSE, CAPILLARY: Glucose-Capillary: 141 mg/dL — ABNORMAL HIGH (ref 65–99)

## 2015-05-07 MED ORDER — CIPROFLOXACIN HCL 250 MG PO TABS: 250.0000 mg | ORAL_TABLET | Freq: Two times a day (BID) | ORAL | Status: DC

## 2015-05-07 MED ORDER — AMLODIPINE BESYLATE 2.5 MG PO TABS: 2.5000 mg | ORAL_TABLET | Freq: Every day | ORAL | Status: DC

## 2015-05-07 NOTE — Progress Notes (Signed)
Pt discharged from hospital per orders from MD. Pt educated on discharge instructions. Pt verbalized understanding of instructions. Pt's IV was removed before discharge. All questions and concerns were addressed. Pt exited hospital via wheelchair.

## 2015-05-07 NOTE — Progress Notes (Signed)
Occupational Therapy Evaluation Patient Details Name: Michelle Holmes MRN: [id number] DOB: [birth year] Today's Date: 05/07/2015    History of Present Illness 65 y.o. female admitted for stroke-like symptoms including slurred speech, HA, nausea, and lethargy. MRI (-) for acute event. PMH significant for HTN, MI, CVA with R hemiparesis, TIA, CHF, CAD, DM, COPD, emphysema, sleep apnea, chronic back pain, scoliosis, DJD, renal insufficiency, anemia, and colon cancer.   Clinical Impression   Unsure of pt's PLOF and home set up as pt is a poor historian and became increasingly agitated when asked questions. Pt presents with R hemiparesis, decreased cognition, emotional lability, generalized weakness, and decreased activity tolerance. Pt completed bed mobility and functional transfers with mod +2 assist with assist primarily for trunk support and to move RLE during transfers. Recommend SNF for post-acute rehab stay due to unknown availability of assistance and pt's increased need for assistance for ADLs and mobility. Will continue to follow acutely.    Follow Up Recommendations  SNF;Supervision/Assistance - 24 hour (If has 24/7 assistance then potentially HHOT)    Equipment Recommendations  Other (comment) (Unsure of accuracy of pt's responses to home information)    Recommendations for Other Services       Precautions / Restrictions Precautions Precautions: Fall Restrictions Weight Bearing Restrictions: No      Mobility Bed Mobility Overal bed mobility: Needs Assistance;+2 for physical assistance Bed Mobility: Supine to Sit     Supine to sit: Mod assist;+2 for physical assistance;HOB elevated     General bed mobility comments: Assist to move RLE off bed, to scoot hips to EOB with bed pad and for trunk support to come to sitting position. HOB elevated 60 degrees, use of bedrails, exited on R side at pt's request.  Transfers Overall transfer level: Needs assistance Equipment used: 2  person hand held assist Transfers: Sit to/from [facility] Sit to Stand: Mod assist;+2 physical assistance Stand pivot transfers: Mod assist;+2 physical assistance       General transfer comment: Mod +2 assist for boost to stand and to maintain balance throughout transfers. Pt requires blocking of RLE and assist with moving R foot back during stand-pivot transfer. Verbal cues for safety and controlled descent onto chair.    Balance Overall balance assessment: Needs assistance Sitting-balance support: Single extremity supported;Feet supported Sitting balance-Leahy Scale: Fair Sitting balance - Comments: Poor trunk control on R side   Standing balance support: Bilateral upper extremity supported;During functional activity Standing balance-Leahy Scale: Zero                              ADL Overall ADL's : Needs assistance/impaired     Grooming: Wash/dry hands;Wash/dry face;Set up;Sitting                   Toilet Transfer: Moderate assistance;+2 for physical assistance;Stand-pivot (2 person hand-held assist) Toilet Transfer Details (indicate cue type and reason): simulated to chair - pt declined needing to use bathroom Toileting- Clothing Manipulation and Hygiene: Moderate assistance;+2 for physical assistance;Sit to/from stand       Functional mobility during ADLs: Moderate assistance;+2 for physical assistance General ADL Comments: Pt demonstrates self-limiting behavior and is easily agitated by being asked questions.     Vision Additional Comments: Pt refused to participate in vision testing   Perception Perception Perception Tested?: No   Praxis Praxis Praxis tested?: Not tested    Pertinent Vitals/Pain Pain Assessment: No/denies pain  Hand Dominance Right   Extremity/Trunk Assessment Upper Extremity Assessment Upper Extremity Assessment: RUE deficits/detail;Generalized weakness RUE Deficits / Details: Pt unwilling to  demonstrate strength/ROM in RUE during session and became agitated. Pt reported, "No, my arm doesn't move at all." Neurology reported overall 2/5 strength. Pt reports no numbness/tingling in RUE RUE Coordination: decreased fine motor;decreased gross motor   Lower Extremity Assessment Lower Extremity Assessment: Defer to PT evaluation   Cervical / Trunk Assessment Cervical / Trunk Assessment: Kyphotic   Communication Communication Communication: Expressive difficulties   Cognition Arousal/Alertness: Awake/alert Behavior During Therapy: Flat affect Overall Cognitive Status: No family/caregiver present to determine baseline cognitive functioning Area of Impairment: Attention;Memory;Problem solving;Safety/judgement;Following commands   Current Attention Level: Selective Memory: Decreased short-term memory Following Commands: Follows one step commands inconsistently;Follows one step commands with increased time Safety/Judgement: Decreased awareness of safety   Problem Solving: Slow processing;Decreased initiation;Difficulty sequencing;Requires verbal cues;Requires tactile cues General Comments: Pt with emotional lability during session. Unsure if this is her baseline.   General Comments       Exercises       Shoulder Instructions      Home Living Family/patient expects to be discharged to:: Private residence Living Arrangements: Other relatives (grandson) Available Help at Discharge: Family;Available 24 hours/day;Personal care attendant Type of Home: House Home Access: Ramped entrance     Home Layout: One level     Bathroom Shower/Tub: Walk-in shower;Door   [employer] Toilet: Standard     Home Equipment: Wheelchair - manual;Shower seat   Additional Comments: Unsure of accuracy of pt's responses. Pt became agitated when asked home/PLOF questions.      Prior Functioning/Environment Level of Independence: Needs assistance  Gait / Transfers Assistance Needed: Users w/c at  all times. Transfers with assistance from children or PCA ADL's / Homemaking Assistance Needed: Assist to transfer in/out of shower, to bathe thoroughly, to get dressed, in/out of bed, and all IADLs   Comments: Unsure of accuracy of responses. No family present    OT Diagnosis: Generalized weakness;Cognitive deficits;Hemiplegia dominant side   OT Problem List: Decreased strength;Decreased range of motion;Decreased activity tolerance;Impaired balance (sitting and/or standing);Decreased cognition;Decreased coordination;Decreased safety awareness;Decreased knowledge of use of DME or AE;Impaired tone;Obesity;Impaired UE functional use   OT Treatment/Interventions: Self-care/ADL training;Therapeutic exercise;Energy conservation;DME and/or AE instruction;Therapeutic activities;Patient/family education;Balance training    OT Goals(Current goals can be found in the care plan section) Acute Rehab OT Goals Patient Stated Goal: to go home OT Goal Formulation: With patient Time For Goal Achievement: 05/21/15 Potential to Achieve Goals: Good ADL Goals Pt Will Perform Eating: with set-up;sitting Pt Will Perform Grooming: with set-up;sitting Pt Will Perform Upper Body Dressing: with min assist;sitting Pt Will Perform Lower Body Dressing: with mod assist;sit to/from stand Pt Will Transfer to Toilet: with mod assist;stand pivot transfer;bedside commode Pt Will Perform Toileting - Clothing Manipulation and hygiene: with mod assist;sitting/lateral leans;sit to/from stand;with caregiver independent in assisting  OT Frequency: Min 2X/week   Barriers to D/C: Decreased caregiver support  Unsure of availability of assistance at home - No family present to provide information. Pt reported her 5 children assist, her 1 grandson lives with her, and she has a PCA. However, these pieces of information came out at different points during evaluation, so unsure of accuracy       Co-evaluation PT/OT/SLP  Co-Evaluation/Treatment: Yes Reason for Co-Treatment: For patient/therapist safety   OT goals addressed during session: ADL's and self-care;Strengthening/ROM      End of Session Equipment Utilized During Treatment: Gait belt  Nurse Communication: Mobility status;Other (comment) (No chair alarm box in room)  Activity Tolerance: Patient tolerated treatment well Patient left: in chair;with call bell/phone within reach   Time: [id number] OT Time Calculation (min): 18 min Charges:  OT General Charges $OT Visit: 1 Procedure OT Evaluation $OT Eval High Complexity: 1 Procedure G-Codes: OT G-codes **NOT FOR INPATIENT CLASS** Functional Assessment Tool Used: clinical judgement Functional Limitation: Self care Self Care Current Status [id number]): At least 60 percent but less than 80 percent impaired, limited or restricted Self Care Goal Status [id number]): At least 40 percent but less than 60 percent impaired, limited or restricted  Redmond Baseman, OTR/L Pager: [phone] 05/07/2015, 9:21 AM

## 2015-05-07 NOTE — Care Management Note (Signed)
Case Management Note  Patient Details  Name: Michelle Holmes MRN: [id number] Date of Birth: 08/27/1950  Subjective/Objective:                    Action/Plan: Patient discharging home with self care. Pt already active with [facility]. CM spoke with [facility] and they would like an order for patient to continue with therapy. CM spoke with Dr Terrence Dupont and wanted outpatient therapy to continue. Orders placed in EPIC. Pts upcoming appts already on the AVS. CM informed the bedside RN and patient.   Expected Discharge Date:                  Expected Discharge Plan:  Home/Self Care  In-House Referral:     Discharge planning Services  CM Consult  Post Acute Care Choice:    Choice offered to:     DME Arranged:    DME Agency:     HH Arranged:    [place] Agency:     Status of Service:  Completed, signed off  Medicare Important Message Given:    Date Medicare IM Given:    Medicare IM give by:    Date Additional Medicare IM Given:    Additional Medicare Important Message give by:     If discussed at [place] of Stay Meetings, dates discussed:    Additional Comments:  Pollie Friar, RN 05/07/2015, 10:12 AM

## 2015-05-07 NOTE — Discharge Summary (Signed)
Discharge summary dictated on 05/07/2015 dictation number is [id number]

## 2015-05-07 NOTE — Discharge Summary (Signed)
Michelle Holmes, Michelle Holmes                  ACCOUNT NO.:  [id number]  MEDICAL RECORD NO.:  [id number]  LOCATION:  5C09C                        FACILITY:  [facility]  PHYSICIAN:  Kinsleigh Ludolph N. Terrence Dupont, M.D. DATE OF BIRTH:  21-Jun-1950  DATE OF ADMISSION:  05/05/2015 DATE OF DISCHARGE:  05/07/2015                              DISCHARGE SUMMARY   ADMITTING DIAGNOSES: 1. Status post transient ischemic attack rule out cerebrovascular     accident. 2. Rule out seizures. 3. History of left cerebrovascular accident with right paresis in the     past. 4. Coronary artery disease with history of percutaneous coronary     intervention to left anterior descending artery in the past. 5. Hypertension. 6. Compensated congestive heart failure secondary to preserved left     ventricular systolic function. 7. Insulin-requiring diabetes mellitus. 8. Morbid obesity. 9. Obesity hypoventilation syndrome. 10.Obstructive sleep apnea. 11.Anemia of chronic disease. 12.Chronic kidney disease, stage 4. 13.History of scoliosis. 14.Degenerative joint disease.  FINAL DIAGNOSES: 1. Status post transient ischemic attack, history of left     cerebrovascular accident with right paresis in December 2016,     stable. 2. Coronary artery disease with history of percutaneous coronary     intervention to left anterior descending artery in the past 3. Hypertension. 4. Compensated congestive heart failure secondary to preserved left     ventricular systolic function. 5. Insulin-requiring diabetes mellitus. 6. Morbid obesity. 7. Obesity hypoventilation syndrome. 8. Obstructive sleep apnea. 9. Anemia of chronic disease. 10.Chronic kidney disease stage 4. 11.History of scoliosis. 12.Degenerative joint disease. 13.Possible urinary tract infection.  DISCHARGE HOME MEDICATIONS: 1. Ciprofloxacin 250 mg 1 tablet twice daily for 1 week. 2. Advair Diskus 250/50 mcg per dose 1 puff twice daily. 3. Ferrous sulfate 325 mg 1 tablet twice  daily. 4. Glipizide 5 mg daily. 5. Lyrica 50 mg 1 capsule twice daily as before. 6. Aspirin 81 mg 1 tablet daily. 7. Atorvastatin 40 mg 1 tablet daily. 8. Clopidogrel 75 mg 1 tablet daily. 9. Fenofibrate 54 mg daily. 10.Lantus insulin 30 units at bedtime as before. 11.Protonix 40 mg 1 tablet daily as before. 12.The patient has been advised to stop oxycodone and acetaminophen. 13.Amlodipine 2.5 mg daily.  DIET:  Low salt, low cholesterol 1800 calories, ADA, weight reducing diet.  The patient has been advised to monitor blood sugars daily in chart, monitor blood pressure daily in chart.  TIA/stroke instructions have been given.  FOLLOWUP:  Follow up with me in 2 weeks.  Follow up with Dr. Leonie Man, [facility] in 2 months.  CONDITION AT DISCHARGE:  Stable.  BRIEF HISTORY AND HOSPITAL COURSE:  [name] is a 65 year old female with past medical history significant for coronary artery disease, history of PTCA and stenting to LAD in the past, history of left CVA with right paresis in December 2016, insulin-requiring diabetes mellitus, hypertension, hyperlipidemia, obstructive sleep apnea, morbid obesity, obesity hypoventilation syndrome, scoliosis, history of congestive heart failure secondary to diastolic dysfunction, degenerative joint disease, chronic anemia, anemia of chronic disease, stage IV came to the ER by EMS complaining of headache associated with slurred speech and nausea and vomiting while in church.  Code stroke was called.  The patient had CT of the brain which showed no acute abnormality.  Her speech gradually improved to the baseline.  The patient denies any weakness in the arms or legs.  She had right-sided weakness from prior stroke.  The patient denies any seizure activity. Denies any fever or chills.  Denies urinary complaints.  The patient denies abdominal pain.  States she ate some chips and drank [facility] before vomiting in the church.  PHYSICAL  EXAMINATION:  GENERAL:  She was alert, awake, oriented x3. VITAL SIGNS:  Blood pressure was 151/65, pulse 81, she had a low-grade temp of 100 degree Fahrenheit. EYES:  Conjunctivae were normal.  Pupils were equal, round, reactive to light. NECK:  Supple.  No JVD.  No bruit. LUNGS:  Clear to auscultation without rhonchi or rales. CARDIOVASCULAR: S1, S2 was normal.  There was soft systolic murmur.  No S3, gallop. ABDOMEN:  Soft.  Bowel sounds were present.  Nontender. EXTREMITIES:  There is no clubbing, cyanosis, or edema. NEURO:  She is alert, awake, oriented x3.  Had slurred speech, is back to the baseline.  She had right-sided paresis as before.  No new acute neuro changes were noted.  LABORATORY DATA:  Sodium was 140, potassium 3.7, BUN 29, creatinine 2.33, blood sugar was 204.  Hemoglobin 10.3, hematocrit 30.6, white count of 9.8, her cholesterol was 136, triglycerides were elevated 228, LDL was 65, HDL was low 25.  Hemoglobin A1c was 7.6.  CT of the brain without contrast showed no acute intracranial pathology, mild cortical volume loss and scattered small vessel ischemic microangiopathy, small chronic infarct at the left frontoparietal region with associated encephalomalacia, chronic lacunar infarct at the left basal ganglia and left thalamus.  The patient subsequently underwent MRI and MRA of the brain, which showed no acute intracranial finding, extensive old ischemic changes throughout the brain as outlined above.  MR cranial angiography does not show any large vessel occlusion, moderate atherosclerotic change of the more distal branch vessels in the anterior circulation, severe atherosclerotic disease affecting both posterior cerebral arteries including previous stenosis at P1 segment on the right.  BRIEF HOSPITAL COURSE:  The patient was admitted to telemetry unit.  The patient was seen by Neurology in the ED in consultation.  The patient did not have any further episodes  of slurred speech or any weakness in the arms or legs.  Speech therapy, OT/PT consultation was called.  The patient is back to baseline.  The patient was felt to be stable from neuro point of view and could be discharged.  The patient had some frequency of urination, although no dysuria associated with low-grade fever.  We will start her on Cipro p.o. for possible UTI.  The patient will be discharged home on above medications and will be followed up in my office in 2 weeks and stroke team in 2 months as outpatient.     Allegra Lai. Terrence Dupont, M.D.     MNH/MEDQ  D:  05/07/2015  T:  05/07/2015  Job:  [id number]

## 2015-05-07 NOTE — Evaluation (Signed)
Physical Therapy Evaluation Patient Details Name: Michelle Holmes MRN: [id number] DOB: December 11, 1950 Today's Date: 05/07/2015   History of Present Illness  65 y.o. female admitted for stroke-like symptoms including slurred speech, HA, nausea, and lethargy. MRI (-) for acute event. PMH significant for HTN, MI, CVA with R hemiparesis, TIA, CHF, CAD, DM, COPD, emphysema, sleep apnea, chronic back pain, scoliosis, DJD, renal insufficiency, anemia, and colon cancer.  Clinical Impression  Pt admitted with above diagnosis. Pt currently with functional limitations due to the deficits listed below (see PT Problem List).  Pt will benefit from skilled PT to increase their independence and safety with mobility to allow discharge to the venue listed below. Pt reporting that she feels like she is back to being herself and at her usual mobility level. The patient reports having a very supportive family that were previously assisting her with her mobility and transfers. Anticipating pt will return home with family assist. PT to follow to progress mobility acutely.       Follow Up Recommendations No PT follow up;Supervision/Assistance - 24 hour    Equipment Recommendations  None recommended by PT    Recommendations for Other Services       Precautions / Restrictions Precautions Precautions: Fall Restrictions Weight Bearing Restrictions: No      Mobility  Bed Mobility Overal bed mobility: +2 for physical assistance;Needs Assistance Bed Mobility: Supine to Sit     Supine to sit: Mod assist;+2 for physical assistance;HOB elevated     General bed mobility comments: asssit needed with LEs and trunk. Pt assisting with LLE using rail and HOB elevated.   Transfers Overall transfer level: Needs assistance Equipment used: 2 person hand held assist Transfers: Sit to/from [facility] Sit to Stand: Mod assist;+2 physical assistance Stand pivot transfers: Mod assist;+2 physical assistance  (assist with blocking Rt knee with transfer.)       General transfer comment: Assist needed for anterior weight shift to stand  Ambulation/Gait                Stairs            Wheelchair Mobility    Modified Rankin (Stroke Patients Only)       Balance Overall balance assessment: Needs assistance Sitting-balance support: Single extremity supported Sitting balance-Leahy Scale: Fair Sitting balance - Comments: Poor trunk control on R side   Standing balance support: Bilateral upper extremity supported Standing balance-Leahy Scale: Poor                               Pertinent Vitals/Pain Pain Assessment: No/denies pain    Home Living Family/patient expects to be discharged to:: Private residence Living Arrangements: Other relatives (grandson) Available Help at Discharge: Family;Available 24 hours/day Type of Home: House Home Access: Ramped entrance     Home Layout: One level Home Equipment: Wheelchair - manual Additional Comments: Pt reports that family members are around to assist with mobility.     Prior Function Level of Independence: Needs assistance   Gait / Transfers Assistance Needed: assist needed with transfers, stand-pivot  ADL's / Homemaking Assistance Needed: Assist to transfer in/out of shower, to bathe thoroughly, to get dressed, in/out of bed, and all IADLs  Comments: Unsure of accuracy of responses. No family present     Hand Dominance   Dominant Hand: Right    Extremity/Trunk Assessment   Upper Extremity Assessment: Defer to OT evaluation RUE Deficits / Details:  Pt unwilling to demonstrate strength/ROM in RUE during session and became agitated. Pt reported, "No, my arm doesn't move at all." Neurology reported overall 2/5 strength. Pt reports no numbness/tingling in RUE         Lower Extremity Assessment: RLE deficits/detail RLE Deficits / Details: requiring assistance with moving Rt LE for all bed mobility and  transfers    Cervical / Trunk Assessment: Kyphotic  Communication   Communication: Expressive difficulties  Cognition Arousal/Alertness: Awake/alert Behavior During Therapy: Flat affect Overall Cognitive Status: No family/caregiver present to determine baseline cognitive functioning Area of Impairment: Attention;Memory;Problem solving;Safety/judgement;Following commands   Current Attention Level: Selective Memory: Decreased short-term memory Following Commands: Follows one step commands inconsistently;Follows one step commands with increased time Safety/Judgement: Decreased awareness of safety   Problem Solving: Slow processing General Comments: Pt with emotional lability during session. Unsure if this is her baseline.    General Comments      Exercises        Assessment/Plan    PT Assessment Patient needs continued PT services  PT Diagnosis Difficulty walking;Generalized weakness   PT Problem List Decreased strength;Decreased range of motion;Decreased activity tolerance;Decreased balance;Decreased mobility;Decreased safety awareness  PT Treatment Interventions DME instruction;Gait training;Functional mobility training;Therapeutic activities;Therapeutic exercise;Balance training;Patient/family education;Wheelchair mobility training   PT Goals (Current goals can be found in the Care Plan section) Acute Rehab PT Goals Patient Stated Goal: to go home PT Goal Formulation: With patient Time For Goal Achievement: 05/21/15 Potential to Achieve Goals: Fair    Frequency Min 3X/week   Barriers to discharge        Co-evaluation   Reason for Co-Treatment: For patient/therapist safety PT goals addressed during session: Mobility/safety with mobility OT goals addressed during session: ADL's and self-care;Strengthening/ROM       End of Session Equipment Utilized During Treatment: Gait belt Activity Tolerance: Patient tolerated treatment well Patient left: in chair;with call  bell/phone within reach Nurse Communication: Mobility status    Functional Assessment Tool Used: clinical judgment Functional Limitation: Mobility: Walking and moving around Mobility: Walking and Moving Around Current Status [phone]): At least 60 percent but less than 80 percent impaired, limited or restricted Mobility: Walking and Moving Around Goal Status [phone]): At least 40 percent but less than 60 percent impaired, limited or restricted    Time: 0825-0848 PT Time Calculation (min) (ACUTE ONLY): 23 min   Charges:   PT Evaluation $PT Eval Moderate Complexity: 1 Procedure     PT G Codes:   PT G-Codes **NOT FOR INPATIENT CLASS** Functional Assessment Tool Used: clinical judgment Functional Limitation: Mobility: Walking and moving around Mobility: Walking and Moving Around Current Status [phone]): At least 60 percent but less than 80 percent impaired, limited or restricted Mobility: Walking and Moving Around Goal Status [phone]): At least 40 percent but less than 60 percent impaired, limited or restricted    Cassell Clement, PT, [place] Pager [phone] Office [phone]  05/07/2015, 10:12 AM

## 2015-05-07 NOTE — Discharge Instructions (Signed)
Transient Ischemic Attack °A transient ischemic attack (TIA) is a "warning stroke" that causes stroke-like symptoms. Unlike a stroke, a TIA does not cause permanent damage to the brain. The symptoms of a TIA can happen very fast and do not last long. It is important to know the symptoms of a TIA and what to do. This can help prevent a major stroke or death. °CAUSES  °A TIA is caused by a temporary blockage in an artery in the brain or neck (carotid artery). The blockage does not allow the brain to get the blood supply it needs and can cause different symptoms. The blockage can be caused by either: °· A blood clot. °· Fatty buildup (plaque) in a neck or brain artery. °RISK FACTORS °· High blood pressure (hypertension). °· High cholesterol. °· Diabetes mellitus. °· Heart disease. °· The buildup of plaque in the blood vessels (peripheral artery disease or atherosclerosis). °· The buildup of plaque in the blood vessels that provide blood and oxygen to the brain (carotid artery stenosis). °· An abnormal heart rhythm (atrial fibrillation). °· Obesity. °· Using any tobacco products, including cigarettes, chewing tobacco, or electronic cigarettes. °· Taking oral contraceptives, especially in combination with using tobacco. °· Physical inactivity. °· A diet high in fats, salt (sodium), and calories. °· Excessive alcohol use. °· Use of illegal drugs (especially cocaine and methamphetamine). °· Being female. °· Being African American. °· Being over the age of 55 years. °· Family history of stroke. °· Previous history of blood clots, stroke, TIA, or heart attack. °· Sickle cell disease. °SIGNS AND SYMPTOMS  °TIA symptoms are the same as a stroke but are temporary. These symptoms usually develop suddenly, or may be newly present upon waking from sleep: °· Sudden weakness or numbness of the face, arm, or leg, especially on one side of the body. °· Sudden trouble walking or difficulty moving arms or legs. °· Sudden  confusion. °· Sudden personality changes. °· Trouble speaking (aphasia) or understanding. °· Difficulty swallowing. °· Sudden trouble seeing in one or both eyes. °· Double vision. °· Dizziness. °· Loss of balance or coordination. °· Sudden severe headache with no known cause. °· Trouble reading or writing. °· Loss of bowel or bladder control. °· Loss of consciousness. °DIAGNOSIS  °Your health care provider may be able to determine the presence or absence of a TIA based on your symptoms, history, and physical exam. CT scan of the brain is usually performed to help identify a TIA. Other tests may include: °· Electrocardiography (ECG). °· Continuous heart monitoring. °· Echocardiography. °· Carotid ultrasonography. °· MRI. °· A scan of the brain circulation. °· Blood tests. °TREATMENT  °Since the symptoms of TIA are the same as a stroke, it is important to seek treatment as soon as possible. You may need a medicine to dissolve a blood clot (thrombolytic) if that is the cause of the TIA. This medicine cannot be given if too much time has passed. Treatment may also include:  °· Rest, oxygen, fluids through an IV tube, and medicines to thin the blood (anticoagulants). °· Measures will be taken to prevent short-term and long-term complications, including infection from breathing foreign material into the lungs (aspiration pneumonia), blood clots in the legs, and falls. °· Procedures to either remove plaque in the carotid arteries or dilate carotid arteries that have narrowed due to plaque. Those procedures are: °¨ Carotid endarterectomy. °¨ Carotid angioplasty and stenting. °· Medicines and diet may be used to address diabetes, high blood pressure, and   other underlying risk factors. °HOME CARE INSTRUCTIONS  °· Take medicines only as directed by your health care provider. Follow the directions carefully. Medicines may be used to control risk factors for a stroke. Be sure you understand all your medicine instructions. °· You  may be told to take aspirin or the anticoagulant warfarin. Warfarin needs to be taken exactly as instructed. °¨ Taking too much or too little warfarin is dangerous. Too much warfarin increases the risk of bleeding. Too little warfarin continues to allow the risk for blood clots. While taking warfarin, you will need to have regular blood tests to measure your blood clotting time. A PT blood test measures how long it takes for blood to clot. Your PT is used to calculate another value called an INR. Your PT and INR help your health care provider to adjust your dose of warfarin. The dose can change for many reasons. It is critically important that you take warfarin exactly as prescribed. °¨ Many foods, especially foods high in vitamin K can interfere with warfarin and affect the PT and INR. Foods high in vitamin K include spinach, kale, broccoli, cabbage, collard and turnip greens, Brussels sprouts, peas, cauliflower, seaweed, and parsley, as well as beef and pork liver, green tea, and soybean oil. You should eat a consistent amount of foods high in vitamin K. Avoid major changes in your diet, or notify your health care provider before changing your diet. Arrange a visit with a dietitian to answer your questions. °¨ Many medicines can interfere with warfarin and affect the PT and INR. You must tell your health care provider about any and all medicines you take; this includes all vitamins and supplements. Be especially cautious with aspirin and anti-inflammatory medicines. Do not take or discontinue any prescribed or over-the-counter medicine except on the advice of your health care provider or pharmacist. °¨ Warfarin can have side effects, such as excessive bruising or bleeding. You will need to hold pressure over cuts for longer than usual. Your health care provider or pharmacist will discuss other potential side effects. °¨ Avoid sports or activities that may cause injury or bleeding. °¨ Be careful when shaving,  flossing your teeth, or handling sharp objects. °¨ Alcohol can change the body's ability to handle warfarin. It is best to avoid alcoholic drinks or consume only very small amounts while taking warfarin. Notify your health care provider if you change your alcohol intake. °¨ Notify your dentist or other health care providers before procedures. °· Eat a diet that includes 5 or more servings of fruits and vegetables each day. This may reduce the risk of stroke. Certain diets may be prescribed to address high blood pressure, high cholesterol, diabetes, or obesity. °¨ A diet low in sodium, saturated fat, trans fat, and cholesterol is recommended to manage high blood pressure. °¨ A diet low in saturated fat, trans fat, and cholesterol, and high in fiber may control cholesterol levels. °¨ A controlled-carbohydrate, controlled-sugar diet is recommended to manage diabetes. °¨ A reduced-calorie diet that is low in sodium, saturated fat, trans fat, and cholesterol is recommended to manage obesity. °· Maintain a healthy weight. °· Stay physically active. It is recommended that you get at least 30 minutes of activity on most or all days. °· Do not use any tobacco products, including cigarettes, chewing tobacco, or electronic cigarettes. If you need help quitting, ask your health care provider. °· Limit alcohol intake to no more than 1 drink per day for nonpregnant women and 2 drinks   per day for men. One drink equals 12 ounces of beer, 5 ounces of wine, or 1½ ounces of hard liquor. °· Do not abuse drugs. °· A safe home environment is important to reduce the risk of falls. Your health care provider may arrange for specialists to evaluate your home. Having grab bars in the bedroom and bathroom is often important. Your health care provider may arrange for equipment to be used at home, such as raised toilets and a seat for the shower. °· Follow all instructions for follow-up with your health care provider. This is very important.  This includes any referrals and lab tests. Proper follow-up can prevent a stroke or another TIA from occurring. °PREVENTION  °The risk of a TIA can be decreased by appropriately treating high blood pressure, high cholesterol, diabetes, heart disease, and obesity, and by quitting smoking, limiting alcohol, and staying physically active. °SEEK MEDICAL CARE IF: °· You have personality changes. °· You have difficulty swallowing. °· You are seeing double. °· You have dizziness. °· You have a fever. °SEEK IMMEDIATE MEDICAL CARE IF:  °Any of the following symptoms may represent a serious problem that is an emergency. Do not wait to see if the symptoms will go away. Get medical help right away. Call your local emergency services (911 in [place] Do not drive yourself to the hospital. °· You have sudden weakness or numbness of the face, arm, or leg, especially on one side of the body. °· You have sudden trouble walking or difficulty moving arms or legs. °· You have sudden confusion. °· You have trouble speaking (aphasia) or understanding. °· You have sudden trouble seeing in one or both eyes. °· You have a loss of balance or coordination. °· You have a sudden, severe headache with no known cause. °· You have new chest pain or an irregular heartbeat. °· You have a partial or total loss of consciousness. °MAKE SURE YOU:  °· Understand these instructions. °· Will watch your condition. °· Will get help right away if you are not doing well or get worse. °  °This information is not intended to replace advice given to you by your health care provider. Make sure you discuss any questions you have with your health care provider. °  °Document Released: 10/22/2004 Document Revised: 02/02/2014 Document Reviewed: 04/19/2013 °[employer] Interactive Patient Education ©2016 [employer] ° °

## 2015-05-13 ENCOUNTER — Ambulatory Visit: Payer: Medicare Other | Admitting: Physical Therapy

## 2015-05-14 ENCOUNTER — Ambulatory Visit: Payer: Self-pay | Admitting: Physical Therapy

## 2015-05-14 ENCOUNTER — Ambulatory Visit: Payer: Medicare Other | Admitting: Physical Therapy

## 2015-05-16 ENCOUNTER — Ambulatory Visit: Payer: Medicare Other | Admitting: Physical Therapy

## 2015-05-16 VITALS — BP 170/93 | HR 66

## 2015-05-16 DIAGNOSIS — G8111 Spastic hemiplegia affecting right dominant side: Secondary | ICD-10-CM | POA: Diagnosis not present

## 2015-05-16 DIAGNOSIS — R2689 Other abnormalities of gait and mobility: Secondary | ICD-10-CM

## 2015-05-16 NOTE — Therapy (Signed)
[facility] [address] [place], [place], [address] Phone: [phone]   Fax:  [phone]  Physical Therapy Treatment  Patient Details  Name: Michelle Holmes MRN: [id number] Date of Birth: 1950/12/21 Referring Provider: Alysia Penna, MD  Encounter Date: 05/16/2015      PT End of Session - 05/16/15 1433    Visit Number 2   Number of Visits 17   Date for PT Re-Evaluation 06/30/15   Authorization Type [employer] MCR - G Codes and PN required every 10 visits   PT Start Time 1016   PT Stop Time 1050  limited by elevated BP   PT Time Calculation (min) 34 min   Activity Tolerance Other (comment)  Activity limited by elevated BP   Behavior During Therapy Flat affect  lethargic      Past Medical History  Diagnosis Date  . Emphysema   . Colon cancer ([place])   . Hypertension   . Hypercholesteremia   . Diabetes mellitus   . COPD (chronic obstructive pulmonary disease) ([place])   . Coronary artery disease   . Obesity   . Sleep apnea   . TIA (transient ischemic attack)   . CHF (congestive heart failure) ([place])   . Anemia   . DJD (degenerative joint disease)   . Renal insufficiency   . Chronic back pain   . Scoliosis   . Obesity hypoventilation syndrome ([place])   . Stroke ([place])   . Myocardial infarction [facility])     Past Surgical History  Procedure Laterality Date  . Coronary angioplasty with stent placement    . Colon surgery      Filed Vitals:   05/16/15 1030 05/16/15 1050  BP: 167/98 170/93  Pulse: 70 66        Subjective Assessment - 05/16/15 1030    Subjective "You mean I'm not getting a new wheelchair today? This is what disgusts me."  Pt hospitalized from 4/9 - 4/11 due to TIA. Note MD ordeer to resume outpatient PT, per Dr. Terrence Dupont.  PT reports she did take her BP medication this morning. She denied headache or significant changes.     Pertinent History PMH significant for: CVA x2 (most recent: 01/13/15), HTN,  HLD, CHF, COPD, OSA, CAD, DM II, colon cancer, DJD, scoliosis, chronic renal insufficiency   Patient Stated Goals "I want a new wheelchair and I want to walk."   Currently in Pain? No/denies                         [facility] Adult PT Treatment/Exercise - 05/16/15 0001    Wheelchair Mobility   Wheelchair Mobility Yes   Wheelchair Assistance 4: Min assist   Wheelchair Propulsion Left upper extremity;Left lower extremity   Wheelchair Parts Management Needs assistance   Distance 10   Comments Requires max encouragement to initiate, and frequent cueing to maintain pt attention to task. Also required cueing for visual attention to R visual field for effective obstacle negotiation on R side.    Posture/Postural Control   Posture/Postural Control Postural limitations   Posture Comments Seated in w/c, pt scooted to edge of seat in posterior pelvic tilt with R knee extended, slightly externally rotated (due to broken w/c leg rest). Also note L w/c foot plate also now partially broken.    Self-Care   Self-Care Other Self-Care Comments   Other Self-Care Comments  Explained and demonstrated techniques for pressure relief, including UE boosting, lateral leans,  and anterior lean. Pt unable to perform boosting due to significant R hemiplegia. Pt able to perform lateral lean to L side (by propping on L forearm); however, R ischial tuberosity still in contact with w/c seat cushion. L lateral lean grossly ineffective. Pt unsafe to attempt lateral leans at home without assistance due to inability to lock/unlock w/c brakes, inability to safely use swing-away arm rests, and due to poor trunk control. Instructed pt to attempt boosting only with hands-on assistance from caregiver. Also explained frequency and duration of pressure relief. Pt verbalized understanding but will require reinforcement.                 PT Education - 05/16/15 1421    Education provided Yes   Education Details Education  on pressure relief, including techniques, frequency, and duration.    Person(s) Educated Patient   Methods Explanation;Demonstration   Comprehension Verbalized understanding;Tactile cues required;Need further instruction          PT Short Term Goals - 05/01/15 1436    PT SHORT TERM GOAL #1   Title Pt will perform home exercises with mod I using paper handout to maximize functional gains made in PT.  (Target date: 05/29/15)   PT SHORT TERM GOAL #2   Title Pt will perform supine <> sit with supervision, min cueing to indicate increased independence getting into/out of bed.   PT SHORT TERM GOAL #3   Title Pt will perform level transfers from w/c <> mat table in B directions with min A, min cueing to indicate increased independence with transfers.   PT SHORT TERM GOAL #4   Title Pt will perform sit <> stand from w/c with min A, min cueing to indicate increased pt independence with functional transfers.   PT SHORT TERM GOAL #5   Title Pt will perform dynamic standing task x2 consecutive minutes with single UE support and min to indicate improved postural awareness/control with functional standing.   Additional Short Term Goals   Additional Short Term Goals Yes   PT SHORT TERM GOAL #6   Title ATP will assess current w/c to assist in determining if pt appropriate candidate for custom w/c to improve positioning, preserve skin integrity, and increase pt independence with mobility.    PT SHORT TERM GOAL #7   Title Pt will improve PASS score from 12/36 to 15/36 to indicate improved independence iwth funcitonal mobility.           PT Long Term Goals - 05/01/15 1501    PT LONG TERM GOAL #1   Title Pt will consistently perform supine <> sit with mod I, no cueing to indicate increased independence getting into/out of bed.  (Target date: 06/26/15)   PT LONG TERM GOAL #2   Title Pt will perform level transfers from w/c <> mat table in B directions with supervision to indicate increased independence  with transfers.   PT LONG TERM GOAL #3   Title Pt will perform unlevel transfers in B directions with min A, min cueing for increased safety/independence with functional transfers.   PT LONG TERM GOAL #4   Title Pt will perform sit <> stand from w/c with supervision to indicate increased pt independence with functional transfers.   PT LONG TERM GOAL #5   Title Pt will improve PASS score from 12/36 to 18/36 to indicate significantly improved independence with functional mobility.   Additional Long Term Goals   Additional Long Term Goals Yes   PT LONG TERM GOAL #6  Title Will attempt ambulation and set goal, if appropriate, to assess if safe/functional to address short-distance household ambulation.               Plan - 05/16/15 1410    Clinical Impression Statement Patient not present for initial scheduled session today. Therefore, this PT contacted via phone to check on patient and to ensure pt was aware of power wheelchair evaluation scheduled for next Tuesday (8/25). P present at clinic 30 minutes later, despite missed appt. PT was able to see patient at later time. Resting BP 167/98. Pt reports she did take BP medication today. Denies headache or significant changes. Due to history of CVA, session limited to very light activity at wheelchair level, assessing vitals throughout. BP 170/93 after attempting pressure relief techniques in w/c. Sent in-basket to primary care physician to notify of elevated BP and to request order for power wheelchair eval.     Rehab Potential Fair   Clinical Impairments Affecting Rehab Potential cognitive impairments; limited family support; relies on friend for transportation   PT Frequency 2x / week   PT Duration 8 weeks   PT Treatment/Interventions ADLs/Self Care Home Management;DME Instruction;Electrical Stimulation;Vestibular;Gait training;Neuromuscular re-education;Stair training;Functional mobility training;Therapeutic activities;Patient/family  education;Therapeutic exercise;Balance training;Prosthetic Training;Orthotic Fit/Training;Wheelchair mobility training   PT Next Visit Plan Wheelchair evaluation, if patient is present.   Consulted and Agree with Plan of Care Patient      Patient will benefit from skilled therapeutic intervention in order to improve the following deficits and impairments:  Postural dysfunction, Impaired tone, Abnormal gait, Decreased balance, Decreased activity tolerance, Decreased endurance, Decreased coordination, Decreased mobility, Impaired perceived functional ability, Impaired sensation, Decreased knowledge of use of DME, Decreased strength  Visit Diagnosis: Spastic hemiplegia affecting right dominant side (HCC)  Other abnormalities of gait and mobility     Problem List Patient Active Problem List   Diagnosis Date Noted  . Absolute anemia   . Thrombocytopenia ([place])   . 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 AKI (acute kidney injury) ([place])   . Dysarthria   . Lethargy   . DM type 2 with diabetic peripheral neuropathy ([place])   . Labile blood pressure   . Hyperlipidemia   . History of CVA with residual deficit   . Chronic obstructive pulmonary disease ([place])   . Hemiparesis, aphasia, and dysphagia as late effect of cerebrovascular accident (CVA) ([place])   . Acute ischemic stroke ([place])   . CVA (cerebral vascular accident) ([place]) 01/14/2015  . CVA (cerebral infarction) 01/14/2015  . Right hemiplegia ([place])   . Right sided weakness 01/13/2015  . Abdominal pain 06/20/2014  . Altered mental status 02/08/2012  . Sleep apnea 12/08/2010  . Morbid obesity ([place]) 12/08/2010  . CHF (congestive heart failure) ([place]) 12/08/2010  . Chronic renal failure 12/08/2010  . TIA (transient ischemic attack) 12/08/2010  . CAD (coronary artery disease) 12/08/2010  . Cellulitis of pubic region 12/01/2010  . Uncontrolled type 2 DM with hyperosmolar nonketotic hyperglycemia ([place]) 12/01/2010   Billie Ruddy, PT, DPT [facility] [address] [place], [place], [address] Phone: [phone]   Fax:  [phone] 05/16/2015, 2:46 PM  Name: Michelle Holmes MRN: [id number] Date of Birth: 1950/03/08

## 2015-05-21 ENCOUNTER — Ambulatory Visit: Payer: Medicare Other | Admitting: Physical Therapy

## 2015-05-21 DIAGNOSIS — G8111 Spastic hemiplegia affecting right dominant side: Secondary | ICD-10-CM

## 2015-05-21 NOTE — Therapy (Signed)
[facility] [address] [place], [place], [address] Phone: [phone]   Fax:  [phone]  Physical Therapy Treatment  Patient Details  Name: Michelle Holmes MRN: [id number] Date of Birth: September 25, 1950 Referring Provider: Alysia Penna, MD  Encounter Date: 05/21/2015      PT End of Session - 05/21/15 1350    Visit Number 3   Number of Visits 17   Date for PT Re-Evaluation 06/30/15   Authorization Type [employer] MCR - G Codes and PN required every 10 visits   PT Start Time 0936  Pt arrived late for session   PT Stop Time 1106   PT Time Calculation (min) 90 min   Equipment Utilized During Treatment Gait belt   Activity Tolerance Patient tolerated treatment well   Behavior During Therapy [facility] for tasks assessed/performed      Past Medical History  Diagnosis Date  . Emphysema   . Colon cancer ([place])   . Hypertension   . Hypercholesteremia   . Diabetes mellitus   . COPD (chronic obstructive pulmonary disease) ([place])   . Coronary artery disease   . Obesity   . Sleep apnea   . TIA (transient ischemic attack)   . CHF (congestive heart failure) ([place])   . Anemia   . DJD (degenerative joint disease)   . Renal insufficiency   . Chronic back pain   . Scoliosis   . Obesity hypoventilation syndrome ([place])   . Stroke ([place])   . Myocardial infarction [facility])     Past Surgical History  Procedure Laterality Date  . Coronary angioplasty with stent placement    . Colon surgery      There were no vitals filed for this visit.      Subjective Assessment - 05/21/15 1349    Subjective Pt arrived accompanied by friend, Abagail Kitchens, who reports he will be unable to stay for w/c assessment today.    Pertinent History PMH significant for: CVA x2 (most recent: 01/13/15), HTN, HLD, CHF, COPD, OSA, CAD, DM II, colon cancer, DJD, scoliosis, chronic renal insufficiency   Patient Stated Goals "I want a new wheelchair and I want to walk."   Currently in Pain? No/denies      Mobility/Seating Evaluation    PATIENT INFORMATION: Name: Michelle Holmes DOB: 12-09-1950  Sex: F Date seen: 05/21/15 Time: 0936  Address:  [address] [place] [address] Physician: Alysia Penna, MD This evaluation/justification form will serve as the LMN for the following suppliers: __________________________ Supplier: [employer] Contact Person: Luz Brazen, [employer] Phone:  [phone]   Seating Therapist: Billie Ruddy, PT, DPT Phone:   [phone]   Phone: [phone]    Spouse/Parent/Caregiver name: Kele Withem (daughter)  Phone number: [phone] (home) [phone] (mobile) Insurance/Payer: UHC Medicare; Medicaid     Reason for Referral: new wheelchair  Patient/Caregiver Goals: power wheelchair for increased independence with propulsion  Patient was seen for face-to-face evaluation for new power wheelchair.  Also present was [employer], ATP to discuss recommendations and wheelchair options.  Further paperwork was completed and sent to vendor.  Patient appears to qualify for power mobility device at this time per objective findings.   MEDICAL HISTORY: Diagnosis: Primary Diagnosis: Spastic hemiplegia affecting right dominant side Onset: 01/13/15 Diagnosis: Acute ischemic VBA thalamic stroke    [] Progressive Disease Relevant past and future surgeries: N/A   Height: 5'7 Weight: 260 lbs Explain recent changes or trends in weight: N/A   History including Falls: has fallen  3 times since DC from hospital on 02/07/15. 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 Daughter lives next door and comes in to help as able, but also works full-time.  [x] Home is accessible to patient           Stairs      [] Yes [x]  No     Ramp [] Yes [x] No Comments:  level entry   COMMUNITY ADL: TRANSPORTATION: [x] Car    [] Van    [] Public Transportation    [] Adapted w/c Lift    [] Ambulance    [] Other:       [] Sits in wheelchair during transport  Employment/School: N/A Specific requirements pertaining to mobility on disability  Other: ?????    FUNCTIONAL/SENSORY PROCESSING SKILLS:  Handedness:   [x] Right     [] Left    [] NA  Comments:  L hand dominant since most recent CVA  Functional Processing Skills for Wheeled Mobility [] Processing Skills are adequate for safe wheelchair operation  Areas of concern than may interfere with safe operation of wheelchair Description of problem   [x]  Attention to environment      [] Judgment      []  Hearing  []  Vision or visual processing      [] Motor Planning  []  Fluctuations in Behavior  Decreased attention to R visual field and R hemi body.    VERBAL COMMUNICATION: [] WFL receptive []  WFL expressive [] Understandable  [x] Difficult to understand  [] non-communicative []  Uses an augmented communication device  CURRENT SEATING / MOBILITY: Current Mobility Base:  [] None [] Dependent [x] Manual [] Scooter [] Power  Type of Control: ?????  Manufacturer:  Breezy Ultra 4 Size:  16" x 16"Age:  3 months; pt received curent w/c upon D/C from hospital on 02/07/15  Current Condition of Mobility Base:  Poor; frame bent.   Current Wheelchair components:  Was discharged with bilateral elevating leg rests, right lap tray, and a general use cushion. Both leg rests are currently broken, pt unsure of location of seat cushion.  Describe posture in present seating system:  Posterior pelvic tilt, trunk rotation to R; majority of weightbearing on R side. Righ leg rest bent, preventing leg rest from locking, therein causing R LE  external rotation.      SENSATION and SKIN ISSUES: Sensation [] Intact  [x] Impaired [] Absent  Level of sensation: impaired in RUE and RLE Pressure Relief: Able to perform effective pressure relief :    [] Yes  [x]  No Method: N/A; see below. If not, Why?: Unable to perform boosting due to significant spastic R hemiplegia. Pt unsafe to attempt lateral leans at home without assistance due to inability to lock/unlock w/c brakes, inability to safely use swing-away arm rests,  and due to poor trunk control.   Skin Issues/Skin Integrity Current Skin Issues  [] Yes [x] No [] Intact []  Red area[]  Open Area  [] Scar Tissue [x] At risk from prolonged sitting Where  ?????  History of Skin Issues  [] Yes [x] No Where  ????? When  ?????  Hx of skin flap surgeries  [] Yes [x] No Where  N/A When  N/A  Limited sitting tolerance [] Yes [x] No Hours spent sitting in wheelchair daily: 12  Complaint of Pain:  Please describe: N/A   Swelling/Edema: Bilateral lower extremities (R > L)   ADL STATUS (in reference to wheelchair use):  Indep Assist Unable Indep with Equip Not assessed Comments  Dressing ????? X X ????? ????? Total A for LB dressing; Min A UB dressing, per chart  Eating ????? ????? ????? ????? ????? ?????  Toileting ????? X ????? ????? ????? ?????  Bathing ????? X ????? ????? ????? ?????  Grooming/Hygiene ????? X ????? ????? ????? ?????  Meal Prep ????? ????? X ????? ????? ?????  IADLS ????? X ????? ????? ????? ?????  Bowel Management: [] Continent  [x] Incontinent  [] Accidents Comments:  uses briefs  Bladder Management: [] Continent  [x] Incontinent  [] Accidents Comments:  uses briefs     WHEELCHAIR SKILLS: Manual w/c Propulsion: [] UE or LE strength and endurance sufficient to participate in ADLs using manual wheelchair Arm : [] left [] right   [] Both      Distance: ????? Foot:  [] left [] right   [] Both  Operate Scooter: []  Strength, hand grip, balance and transfer appropriate for use [] Living  environment is accessible for use of scooter  Operate Power w/c:  []  Std. Joystick   []  Alternative Controls Indep []  Assist []  Dependent/unable []  N/A []   [] Safe          []  Functional      Distance: ?????  Bed confined without wheelchair []  Yes []  No   STRENGTH/RANGE OF MOTION:  AROM Range of Motion Strength  Shoulder WFL in LUE No active movement in RUE due to spastic hemiplegia WFL in LUE 0/5 RUE due to spastic hemiplegia  Elbow WFL in LUE No active movement in RUE due to spastic hemiplegia WFL in LUE 0/5 RUE due to spastic hemiplegia  Wrist/Hand WFL in LUE No active movement in RUE due to spastic hemiplegia R elbow extension limited to -47 degrees at rest; able to achieve -42 degrees passively (limted by pain and decreased muscle length) WFL in LUE 0/5 RUE due to spastic hemiplegia  Hip WFL in L hip No active movement in R hip. WFL in L hip No palpable activation in RLE on MMT  Knee WFL in L knee No active movement in R knee WFL in L knee No palpable activation in RLE on MMT  Ankle L ankle DF limited to grossly neutral in L ankle. L ankle PF AROM grossly WFL. No active movement in R ankle. WFL in L ankle No palpable activation in RLE on MMT     MOBILITY/BALANCE:  []  Patient is totally dependent for mobility  ?????    Balance Transfers Ambulation  Sitting Balance: Standing Balance: []  Independent []  Independent/Modified Independent  [x]  WFL     []  WFL []  Supervision []  Supervision  []  Uses UE for balance  []  Supervision []  Min Assist []  Ambulates with Assist  ?????    []  Min Assist []  Min assist []  Mod Assist []  Ambulates with Device:      []  RW  []  StW  []  Cane  []  ?????  []  Mod Assist []   Mod assist [x]  Max assist   []  Max Assist [x]  Max assist []  Dependent []  Indep. Short Distance Only  []  Unable []  Unable []  Lift / Sling Required Distance (in feet)  ?????   []  Sliding board [x]  Unable to Ambulate (see explanation below)  Cardio Status:  [] Intact  [x]  Impaired   []   NA      history of CHF  Respiratory Status:  [] Intact   [x] Impaired   [] NA      history of emphysema  Orthotics/Prosthetics: N/A  Comments (Address manual vs power w/c vs scooter): Unable to functionally self-propel manual wheelchair         Anterior / Posterior Obliquity Rotation-Pelvis ?????  PELVIS    []  [x]  []   Neutral Posterior Anterior  []  [x]  [x]   WFL Rt elev Lt elev  []  [x]  []   WFL Right Left                      Anterior    Anterior     []  Fixed []  Other []  Partly Flexible [x]  Flexible   []  Fixed []  Other []  Partly Flexible  [x]  Flexible  []  Fixed []  Other []  Partly Flexible  [x]  Flexible   TRUNK  []  [x]  []   WFL ? Thoracic ? Lumbar  Kyphosis Lordosis  [x]  []  []   WFL Convex Convex  Right Left [] c-curve [] s-curve [] multiple  []  Neutral []  Left-anterior [x]  Right-anterior     []  Fixed [x]  Flexible []  Partly Flexible []  Other  []  Fixed [x]  Flexible []  Partly Flexible []  Other  []  Fixed             [x]  Flexible []  Partly Flexible []  Other    Position Windswept  ?????  HIPS          []            [x]               []    Neutral       Abduct        ADduct         [x]           []            []   Neutral Right           Left      []  Fixed []  Subluxed []  Partly Flexible []  Dislocated [x]  Flexible  []  Fixed []  Other []  Partly Flexible  []  Flexible                 Foot Positioning Knee Positioning  ?????    [x]  WFL  [] Lt [] Rt [x]  WFL  [] Lt [] Rt    KNEES ROM concerns: ROM concerns:    & Dorsi-Flexed [] Lt [] Rt ?????    FEET Plantar Flexed [] Lt [] Rt      Inversion                 [] Lt [] Rt      Eversion                 [] Lt [] Rt     HEAD [x]  Functional [x]  Good Head Control  ?????  & []  Flexed         []  Extended []  Adequate Head Control    NECK []  Rotated  Lt  []  Lat Flexed Lt []  Rotated  Rt []  Lat Flexed Rt []  Limited Head Control     []  Cervical Hyperextension []  Absent  Head  Control     SHOULDERS ELBOWS WRIST& HAND Finger flexor contracture fingers 3-5 of  R hand      Left     Right    Left     Right    Left     Right   U/E [x] Functional           [x] Functional WFL elbow extension limited to -47 degrees at rest due to spasticity, hemiplegia  [] Fisting             [x] Fisting      [] elev   [] dep      [] elev   [] dep       [] pro -[] retract     [] pro  [] retract [] subluxed             [] subluxed           Goals for Wheelchair Mobility  [x]  Independence with mobility in the home with motor related ADLs (MRADLs)  [x]  Independence with MRADLs in the community []  Provide dependent mobility  [x]  Provide recline     [x] Provide tilt   Goals for Seating system [x]  Optimize pressure distribution [x]  Provide support needed to facilitate function or safety [x]  Provide corrective forces to assist with maintaining or improving posture []  Accommodate client's posture:   current seated postures and positions are not flexible or will not tolerate corrective forces [x]  Client to be independent with relieving pressure in the wheelchair [x] Enhance physiological function such as breathing, swallowing, digestion  Simulation ideas/Equipment trials:Pt trialed power wheelchair in clinic and was able to functionally utilize w/c, initially with subtle to min cueing with effective witin-session carryover. State why other equipment was unsuccessful:Pt unable to self-propel manual wheelchair or ambulate with assistive device.   MOBILITY BASE RECOMMENDATIONS and JUSTIFICATION: MOBILITY COMPONENT JUSTIFICATION  Manufacturer: Quantum Model: Q6 Edge 2.0   Size: Width 18"Seat Depth 20" [x] provide transport from point A to B      [x] promote Indep mobility  [x] is not a safe, functional ambulator [x] walker or cane inadequate [] non-standard width/depth necessary to accommodate anatomical measurement []  ?????  [] Manual Mobility Base [] non-functional ambulator    [] Scooter/POV  [] can safely operate  [] can safely transfer   [] has adequate trunk stability  [] cannot functionally  propel manual w/c  [x] Power Mobility Base  [x] non-ambulatory  [x] cannot functionally propel manual wheelchair  [x]  cannot functionally and safely operate scooter/POV [x] can safely operate and willing to  [] Stroller Base [] infant/child  [] unable to propel manual wheelchair [] allows for growth [] non-functional ambulator [] non-functional UE [] Indep mobility is not a goal at this time  [x] Tilt  [] Forward [x] Backward [x] Powered tilt  [] Manual tilt  [x] change position against gravitational force on head and shoulders  [x] change position for pressure relief/cannot weight shift [x] transfers  [x] management of tone [x] rest periods [x] control edema [x] facilitate postural control  []  ?????  [x] Recline  [x] Power recline on power base [] Manual recline on manual base  [] accommodate femur to back angle  [x] bring to full recline for ADL care  [x] change position for pressure relief/cannot weight shift [x] rest periods [x] repositioning for transfers or clothing/diaper /catheter changes [x] head positioning  [] Lighter weight required [] self- propulsion  [] lifting []  ?????  [x] Heavy Duty required [x] user weight greater than 250# [] extreme tone/ over active movement [x] broken frame on previous chair []  ?????  [x]  Back  []  Angle Adjustable []  Custom molded sport back [x] postural control [] control of tone/spasticity [] accommodation of range of motion [x] UE functional control [] accommodation for seating system []  ????? [x] provide lateral trunk support [] accommodate deformity [x] provide posterior trunk support []   provide lumbar/sacral support [] support trunk in midline [] Pressure relief over spinal processes  [x]  Seat Cushion M-2 Wedge Cushion with incontinence cover [x] impaired sensation  [] decubitus ulcers present [] history of pressure ulceration [x] prevent pelvic extension [] low maintenance  [x] stabilize pelvis  [] accommodate obliquity [] accommodate multiple deformity [x] neutralize lower  extremity position [x] increase pressure distribution []  ?????  [x]  Pelvic/thigh support  [x]  Lateral thigh guide []  Distal medial pad  []  Distal lateral pad [x]  pelvis in neutral [] accommodate pelvis [x]  position upper legs [x]  alignment []  accommodate ROM []  decr adduction [x] accommodate tone [x] removable for transfers [x] decr abduction  []  Lateral trunk Supports []  Lt     []  Rt [] decrease lateral trunk leaning [] control tone [] contour for increased contact [] safety  [] accommodate asymmetry []  ?????  [x]  Mounting hardware  [] lateral trunk supports  [] back   [] seat [x] headrest      [x]  thigh support [] fixed   [] swing away [] attach seat platform/cushion to w/c frame [] attach back cushion to w/c frame [x] mount postural supports [x] mount headrest  [] swing medial thigh support away [x] swing lateral supports away for transfers  []  ?????    Armrests  [] fixed [x] adjustable height [] removable   [] swing away  [x] flip back   [] reclining [x] full length pads [] desk    [] pads tubular  [x] provide support with elbow at 90   [] provide support for w/c tray [x] change of height/angles for variable activities [x] remove for transfers [x] allow to come closer to table top [x] remove for access to tables []  ?????  Hangers/ Leg rests  [] 60 [] 70 [] 90 [x] elevating [] heavy duty  [x] articulating [] fixed [] lift off [] swing away     [x] power [x] provide LE support  [x] accommodate to hamstring tightness [x] elevate legs during recline   [x] provide change in position for Legs [] Maintain placement of feet on footplate [] durability [x] enable transfers [x] decrease edema [] Accommodate lower leg length []  ?????  Foot support Footplate    [] Lt  []  Rt  [x]  Center mount [x] flip up     [x] depth/angle adjustable [] Amputee adapter    []  Lt     []  Rt [x] provide foot support [] accommodate to ankle ROM [x] transfers [] Provide support for residual extremity []  allow foot to go under wheelchair base [x]  decrease  tone  []  ?????  []  Ankle strap/heel loops [] support foot on foot support [] decrease extraneous movement [] provide input to heel  [] protect foot  Tires: [] pneumatic  [x] flat free inserts  [] solid  [x] decrease maintenance  [x] prevent frequent flats [] increase shock absorbency [] decrease pain from road shock [] decrease spasms from road shock []  ?????  [x]  Headrest  [x] provide posterior head support [x] provide posterior neck support [x] provide lateral head support [] provide anterior head support [x] support during tilt and recline [] improve feeding   [] improve respiration [] placement of switches [x] safety  [] accommodate ROM  [] accommodate tone [] improve visual orientation  []  Anterior chest strap []  Vest []  Shoulder retractors  [] decrease forward movement of shoulder [] accommodation of TLSO [] decrease forward movement of trunk [] decrease shoulder elevation [] added abdominal support [] alignment [] assistance with shoulder control  []  ?????  Pelvic Positioner [x] Belt [] SubASIS bar [] Dual Pull [] stabilize tone [x] decrease falling out of chair/ **will not Decr potential for sliding due to pelvic tilting [] prevent excessive rotation [] pad for protection over boney prominence [] prominence comfort [] special pull angle to control rotation []  ?????  Upper Extremity Support [] L   [x]  R [x] Arm trough    [x] hand support []  tray       [] full tray [x] swivel mount [x] decrease edema      [x] decrease subluxation   [x] control tone   [] placement for AAC/Computer/EADL [x] decrease  gravitational pull on shoulders [x] provide midline positioning [] provide support to increase UE function [x] provide hand support in natural position [] provide work surface   POWER WHEELCHAIR CONTROLS  [x] Proportional  [] Non-Proportional Type Joystick [x] Left  [] Right [x] provides access for controlling wheelchair   [] lacks motor control to operate proportional drive control [] unable to understand  proportional controls  Actuator Control Module  [] Single  [x] Multiple   [x] Allow the client to operate the power seat function(s) through the joystick control   [] Safety Reset Switches [] Used to change modes and stop the wheelchair when driving in latch mode    [x] [employer]   [x] programming for accurate control [] progressive Disease/changing condition [] non-proportional drive control needed [x] Needed in order to operate power seat functions through joystick control   [] Display box [] Allows user to see in which mode and drive the wheelchair is set  [] necessary for alternate controls    [] Digital interface electronics [] Allows w/c to operate when using alternative drive controls  [] ASL Head Array [] Allows client to operate wheelchair  through switches placed in tri-panel headrest  [] Sip and puff with tubing kit [] needed to operate sip and puff drive controls  [] Upgraded tracking electronics [] increase safety when driving [] correct tracking when on uneven surfaces  [x] Mount for switches or joystick [x] Attaches switches to w/c  [x] Swing away for access or transfers [] midline for optimal placement [] provides for consistent access  [] Attendant controlled joystick plus mount [] safety [] long distance driving [] operation of seat functions [] compliance with transportation regulations []  ?????    Rear wheel placement/Axle adjustability [] None [] semi adjustable [] fully adjustable  [] improved UE access to wheels [] improved stability [] changing angle in space for improvement of postural stability [] 1-arm drive access [] amputee pad placement []  ?????  Wheel rims/ hand rims  [] metal  [] plastic coated [] oblique projections [] vertical projections [] Provide ability to propel manual wheelchair  []  Increase self-propulsion with hand weakness/decreased grasp  Push handles [] extended  [] angle adjustable  [] standard [] caregiver access [] caregiver assist [] allows "hooking" to enable increased  ability to perform ADLs or maintain balance  One armed device  [] Lt   [] Rt [] enable propulsion of manual wheelchair with one arm   []  ?????   Brake/wheel lock extension []  Lt   []  Rt [] increase indep in applying wheel locks   [] Side guards [] prevent clothing getting caught in wheel or becoming soiled []  prevent skin tears/abrasions  Battery: NF-22 x2 [x] to power wheelchair ?????  Other: Molded calf panel to maintain leg position in midline. ?????  The above equipment has a life- long use expectancy. Growth and changes in medical and/or functional conditions would be the exceptions. This is to certify that the therapist has no financial relationship with durable medical provider or manufacturer. The therapist will not receive remuneration of any kind for the equipment recommended in this evaluation.   Patient has mobility limitation that significantly impairs safe, timely participation in one or more mobility related ADL's.  (bathing, toileting, feeding, dressing, grooming, moving from room to room)                                                             [x]  Yes []  No Will mobility device sufficiently improve ability to participate and/or be aided in participation of MRADL's?         [x]  Yes []  No Can limitation be compensated  for with use of a cane or walker?                                                                                []  Yes [x]  No Does patient or caregiver demonstrate ability/potential ability & willingness to safely use the mobility device?   [x]  Yes []  No Does patient's home environment support use of recommended mobility device?                                                    [x]  Yes []  No Does patient have sufficient upper extremity function necessary to functionally propel a manual wheelchair?    [x]  Yes []  No Does patient have sufficient strength and trunk stability to safely operate a POV (scooter)?                                  []  Yes [x]  No Does patient need  additional features/benefits provided by a power wheelchair for MRADL's in the home?       [x]  Yes []  No Does the patient demonstrate the ability to safely use a power wheelchair?                                                              [x]  Yes []  No  Therapist Name Printed: Billie Ruddy, PT, DPT Date: 05/21/2015  Therapist's Signature:   Date:   Supplier's Name Printed: [employer] Date: 05/21/2015  Supplier's Signature:   Date:  Patient/Caregiver Signature:   Date:     This is to certify that I have read this evaluation and do agree with the content within:    Physician's Name Printed: ?????  Physician's Signature:  Date:     This is to certify that I, the above signed therapist have the following affiliations: []  This DME provider []  Manufacturer of recommended equipment []  Patient's long term care facility [x]  None of the above                                   PT Short Term Goals - 05/01/15 1436    PT SHORT TERM GOAL #1   Title Pt will perform home exercises with mod I using paper handout to maximize functional gains made in PT.  (Target date: 05/29/15)   PT SHORT TERM GOAL #2   Title Pt will perform supine <> sit with supervision, min cueing to indicate increased independence getting into/out of bed.   PT SHORT TERM GOAL #3   Title Pt will perform level transfers from w/c <> mat table in B directions with min A, min cueing to indicate increased independence with transfers.   PT  SHORT TERM GOAL #4   Title Pt will perform sit <> stand from w/c with min A, min cueing to indicate increased pt independence with functional transfers.   PT SHORT TERM GOAL #5   Title Pt will perform dynamic standing task x2 consecutive minutes with single UE support and min to indicate improved postural awareness/control with functional standing.   Additional Short Term Goals   Additional Short Term Goals Yes   PT SHORT TERM GOAL #6   Title ATP will assess  current w/c to assist in determining if pt appropriate candidate for custom w/c to improve positioning, preserve skin integrity, and increase pt independence with mobility.    PT SHORT TERM GOAL #7   Title Pt will improve PASS score from 12/36 to 15/36 to indicate improved independence iwth funcitonal mobility.           PT Long Term Goals - 05/01/15 1501    PT LONG TERM GOAL #1   Title Pt will consistently perform supine <> sit with mod I, no cueing to indicate increased independence getting into/out of bed.  (Target date: 06/26/15)   PT LONG TERM GOAL #2   Title Pt will perform level transfers from w/c <> mat table in B directions with supervision to indicate increased independence with transfers.   PT LONG TERM GOAL #3   Title Pt will perform unlevel transfers in B directions with min A, min cueing for increased safety/independence with functional transfers.   PT LONG TERM GOAL #4   Title Pt will perform sit <> stand from w/c with supervision to indicate increased pt independence with functional transfers.   PT LONG TERM GOAL #5   Title Pt will improve PASS score from 12/36 to 18/36 to indicate significantly improved independence with functional mobility.   Additional Long Term Goals   Additional Long Term Goals Yes   PT LONG TERM GOAL #6   Title Will attempt ambulation and set goal, if appropriate, to assess if safe/functional to address short-distance household ambulation.               Plan - 05/21/15 1351    Clinical Impression Statement Completed assessment for power wheelchair. See treatment note for details.    Rehab Potential Fair   Clinical Impairments Affecting Rehab Potential cognitive impairments; limited family support; relies on friend for transportation   PT Frequency 2x / week   PT Duration 8 weeks   PT Treatment/Interventions ADLs/Self Care Home Management;DME Instruction;Electrical Stimulation;Vestibular;Gait training;Neuromuscular re-education;Stair  training;Functional mobility training;Therapeutic activities;Patient/family education;Therapeutic exercise;Balance training;Prosthetic Training;Orthotic Fit/Training;Wheelchair mobility training   PT Next Visit Plan Bed mobility, functional transfers.   Consulted and Agree with Plan of Care Patient      Patient will benefit from skilled therapeutic intervention in order to improve the following deficits and impairments:  Postural dysfunction, Impaired tone, Abnormal gait, Decreased balance, Decreased activity tolerance, Decreased endurance, Decreased coordination, Decreased mobility, Impaired perceived functional ability, Impaired sensation, Decreased knowledge of use of DME, Decreased strength  Visit Diagnosis: Spastic hemiplegia affecting right dominant side [facility])     Problem List Patient Active Problem List   Diagnosis Date Noted  . Absolute anemia   . Thrombocytopenia ([place])   . Acute ischemic VBA thalamic stroke ([place]) 01/18/2015  . AKI (acute kidney injury) ([place])   . Dysarthria   . Lethargy   . DM type 2 with diabetic peripheral neuropathy ([place])   . Labile blood pressure   . Hyperlipidemia   . History of CVA  with residual deficit   . Chronic obstructive pulmonary disease ([place])   . Hemiparesis, aphasia, and dysphagia as late effect of cerebrovascular accident (CVA) ([place])   . Acute ischemic stroke ([place])   . CVA (cerebral vascular accident) ([place]) 01/14/2015  . CVA (cerebral infarction) 01/14/2015  . Right hemiplegia ([place])   . Right sided weakness 01/13/2015  . Abdominal pain 06/20/2014  . Altered mental status 02/08/2012  . Sleep apnea 12/08/2010  . Morbid obesity ([place]) 12/08/2010  . CHF (congestive heart failure) ([place]) 12/08/2010  . Chronic renal failure 12/08/2010  . TIA (transient ischemic attack) 12/08/2010  . CAD (coronary artery disease) 12/08/2010  . Cellulitis of pubic region 12/01/2010  . Uncontrolled type 2 DM with hyperosmolar nonketotic hyperglycemia ([place])  12/01/2010    Stefano Gaul 05/21/2015, 1:53 PM  [facility] [address] [place], [place], [address] Phone: [phone]   Fax:  [phone]  Name: Michelle Holmes MRN: [id number] Date of Birth: [birth year]

## 2015-05-23 ENCOUNTER — Encounter: Payer: Self-pay | Admitting: Physical Therapy

## 2015-05-23 ENCOUNTER — Ambulatory Visit: Payer: Medicare Other | Admitting: Physical Therapy

## 2015-05-23 VITALS — BP 163/100 | HR 80

## 2015-05-23 DIAGNOSIS — R293 Abnormal posture: Secondary | ICD-10-CM

## 2015-05-23 DIAGNOSIS — G8111 Spastic hemiplegia affecting right dominant side: Secondary | ICD-10-CM

## 2015-05-23 DIAGNOSIS — R208 Other disturbances of skin sensation: Secondary | ICD-10-CM

## 2015-05-23 DIAGNOSIS — R2689 Other abnormalities of gait and mobility: Secondary | ICD-10-CM

## 2015-05-24 ENCOUNTER — Ambulatory Visit: Payer: Medicare Other | Admitting: Physical Therapy

## 2015-05-24 NOTE — Therapy (Signed)
[facility] [address] [place], [place], [address] Phone: [phone]   Fax:  [phone]  Physical Therapy Treatment  Patient Details  Name: Michelle Holmes MRN: [id number] Date of Birth: February 04, 1950 Referring Provider: Alysia Penna, MD  Encounter Date: 05/23/2015      PT End of Session - 05/23/15 0937    Visit Number 4   Number of Visits 17   Date for PT Re-Evaluation 06/30/15   Authorization Type [employer] MCR - G Codes and PN required every 10 visits   PT Start Time 0932   PT Stop Time 1015   PT Time Calculation (min) 43 min   Equipment Utilized During Treatment Gait belt   Activity Tolerance Patient tolerated treatment well   Behavior During Therapy [facility] for tasks assessed/performed      Past Medical History  Diagnosis Date  . Emphysema   . Colon cancer ([place])   . Hypertension   . Hypercholesteremia   . Diabetes mellitus   . COPD (chronic obstructive pulmonary disease) ([place])   . Coronary artery disease   . Obesity   . Sleep apnea   . TIA (transient ischemic attack)   . CHF (congestive heart failure) ([place])   . Anemia   . DJD (degenerative joint disease)   . Renal insufficiency   . Chronic back pain   . Scoliosis   . Obesity hypoventilation syndrome ([place])   . Stroke ([place])   . Myocardial infarction [facility])     Past Surgical History  Procedure Laterality Date  . Coronary angioplasty with stent placement    . Colon surgery      Filed Vitals:   05/23/15 0936 05/23/15 1004 05/23/15 1011  BP: 147/81 189/101 163/100  Pulse: 77 77 80        Subjective Assessment - 05/23/15 0936    Subjective Pt arrived a day early for her appointment and was worked in (new copy of schedule provided due to this being an onging issue). Pt is unoccompained today. She reports no falls or pain currently.   Pertinent History PMH significant for: CVA x2 (most recent: 01/13/15), HTN, HLD, CHF, COPD, OSA, CAD, DM II, colon  cancer, DJD, scoliosis, chronic renal insufficiency   Patient Stated Goals "I want a new wheelchair and I want to walk."   Pain Score 0-No pain            [facility] Adult PT Treatment/Exercise - 05/23/15 0940    Bed Mobility   Bed Mobility Sit to Sidelying Left;Left Sidelying to Sit;Rolling Right;Rolling Left   Rolling Right 4: Min assist   Rolling Left 3: Mod assist   Rolling Left Details (indicate cue type and reason) cues on sequencing and assist needed for follow through   Supine to Sit 4: Min assist;HOB flat   Supine to Sit Details (indicate cue type and reason) utilized log roll technique with cues on sequencing and faclitation for follow through   Sit to Sidelying Left 4: Min assist;HOB flat   Sit to Sidelying Left Details (indicate cue type and reason) using log roll technique, cues and facilitation for follow through with cues on sequencing   Transfers   Transfers Lateral/Scoot Geophysicist/field seismologist Transfers;Sit to Stand;Stand to Sit   Sit to Stand 3: Mod assist;2: Max assist;With upper extremity assist;From bed   Sit to Stand Details Verbal cues for sequencing;Verbal cues for technique;Verbal cues for precautions/safety;Manual facilitation for weight bearing;Manual facilitation for weight shifting;Manual facilitation for placement   Sit  to Stand Details (indicate cue type and reason) to<>from mat table with right knee blocked to prevent buckling. mod assist of 2 people for 1st stand, max assist of 1 person for second stand. pt able to stand with varied assistance, min<>mod, for 10-20 seconds each stand. cues/facilitation for upright posture and equal LE weight bearing.                           Stand to Sit 3: Mod assist;With upper extremity assist;To bed;Uncontrolled descent   Stand to Sit Details (indicate cue type and reason) Verbal cues for precautions/safety;Verbal cues for technique;Verbal cues for sequencing;Manual facilitation for weight bearing;Manual facilitation for weight  shifting   Stand to Sit Details cues to use left UE to reach back to assist with slowing descent with sitting down   Lateral/Scoot Transfers 2: Max assist;With armrests removed   Lateral/Scoot Transfer Details (indicate cue type and reason) wheelchair>mat table with mod>max assist, cues to push down throught left UE/LE to assist with trunk/pelvis elevation and movement.  lateral scooting along edge of mat table as well with cues as stated. Pt needed facilitaion for anterior weight shifting as well to allow for increaed pelvic lift.             PT Short Term Goals - 05/01/15 1436    PT SHORT TERM GOAL #1   Title Pt will perform home exercises with mod I using paper handout to maximize functional gains made in PT.  (Target date: 05/29/15)   PT SHORT TERM GOAL #2   Title Pt will perform supine <> sit with supervision, min cueing to indicate increased independence getting into/out of bed.   PT SHORT TERM GOAL #3   Title Pt will perform level transfers from w/c <> mat table in B directions with min A, min cueing to indicate increased independence with transfers.   PT SHORT TERM GOAL #4   Title Pt will perform sit <> stand from w/c with min A, min cueing to indicate increased pt independence with functional transfers.   PT SHORT TERM GOAL #5   Title Pt will perform dynamic standing task x2 consecutive minutes with single UE support and min to indicate improved postural awareness/control with functional standing.   Additional Short Term Goals   Additional Short Term Goals Yes   PT SHORT TERM GOAL #6   Title ATP will assess current w/c to assist in determining if pt appropriate candidate for custom w/c to improve positioning, preserve skin integrity, and increase pt independence with mobility.    PT SHORT TERM GOAL #7   Title Pt will improve PASS score from 12/36 to 15/36 to indicate improved independence iwth funcitonal mobility.           PT Long Term Goals - 05/01/15 1501    PT LONG TERM  GOAL #1   Title Pt will consistently perform supine <> sit with mod I, no cueing to indicate increased independence getting into/out of bed.  (Target date: 06/26/15)   PT LONG TERM GOAL #2   Title Pt will perform level transfers from w/c <> mat table in B directions with supervision to indicate increased independence with transfers.   PT LONG TERM GOAL #3   Title Pt will perform unlevel transfers in B directions with min A, min cueing for increased safety/independence with functional transfers.   PT LONG TERM GOAL #4   Title Pt will perform sit <> stand from w/c with  supervision to indicate increased pt independence with functional transfers.   PT LONG TERM GOAL #5   Title Pt will improve PASS score from 12/36 to 18/36 to indicate significantly improved independence with functional mobility.   Additional Long Term Goals   Additional Long Term Goals Yes   PT LONG TERM GOAL #6   Title Will attempt ambulation and set goal, if appropriate, to assess if safe/functional to address short-distance household ambulation.           Plan - 05/23/15 0937    Clinical Impression Statement todays skilled session focused on bed mobilty, transfer training and strengthening. Pt reported fatigue with session. Pt's BP continues to elevate as well with activity, limiting what can be safely done with sessions.  Pt making progress toward goals.   Rehab Potential Fair   Clinical Impairments Affecting Rehab Potential cognitive impairments; limited family support; relies on friend for transportation   PT Frequency 2x / week   PT Duration 8 weeks   PT Treatment/Interventions ADLs/Self Care Home Management;DME Instruction;Electrical Stimulation;Vestibular;Gait training;Neuromuscular re-education;Stair training;Functional mobility training;Therapeutic activities;Patient/family education;Therapeutic exercise;Balance training;Prosthetic Training;Orthotic Fit/Training;Wheelchair mobility training   PT Next Visit Plan Bed  mobility, functional transfers.   Consulted and Agree with Plan of Care Patient      Patient will benefit from skilled therapeutic intervention in order to improve the following deficits and impairments:  Postural dysfunction, Impaired tone, Abnormal gait, Decreased balance, Decreased activity tolerance, Decreased endurance, Decreased coordination, Decreased mobility, Impaired perceived functional ability, Impaired sensation, Decreased knowledge of use of DME, Decreased strength  Visit Diagnosis: Spastic hemiplegia affecting right dominant side (HCC)  Other abnormalities of gait and mobility  Abnormal posture  Other disturbances of skin sensation     Problem List Patient Active Problem List   Diagnosis Date Noted  . Absolute anemia   . Thrombocytopenia ([place])   . Acute ischemic VBA thalamic stroke ([place]) 01/18/2015  . AKI (acute kidney injury) ([place])   . Dysarthria   . Lethargy   . DM type 2 with diabetic peripheral neuropathy ([place])   . Labile blood pressure   . Hyperlipidemia   . History of CVA with residual deficit   . Chronic obstructive pulmonary disease ([place])   . 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 Acute ischemic stroke ([place])   . CVA (cerebral vascular accident) ([place]) 01/14/2015  . CVA (cerebral infarction) 01/14/2015  . Right hemiplegia ([place])   . Right sided weakness 01/13/2015  . Abdominal pain 06/20/2014  . Altered mental status 02/08/2012  . Sleep apnea 12/08/2010  . Morbid obesity ([place]) 12/08/2010  . CHF (congestive heart failure) ([place]) 12/08/2010  . Chronic renal failure 12/08/2010  . TIA (transient ischemic attack) 12/08/2010  . CAD (coronary artery disease) 12/08/2010  . Cellulitis of pubic region 12/01/2010  . Uncontrolled type 2 DM with hyperosmolar nonketotic hyperglycemia ([place]) 12/01/2010    Willow Ora, PTA, [facility] [address] [place], [place]  [id number] [phone] 05/24/2015, 5:01 PM   Name: Michelle Holmes MRN: [id number] Date of Birth: 08/25/1950

## 2015-05-27 DIAGNOSIS — I252 Old myocardial infarction: Secondary | ICD-10-CM | POA: Diagnosis not present

## 2015-05-27 DIAGNOSIS — I639 Cerebral infarction, unspecified: Secondary | ICD-10-CM | POA: Diagnosis not present

## 2015-05-27 DIAGNOSIS — I2510 Atherosclerotic heart disease of native coronary artery without angina pectoris: Secondary | ICD-10-CM | POA: Diagnosis not present

## 2015-05-27 DIAGNOSIS — I1310 Hypertensive heart and chronic kidney disease without heart failure, with stage 1 through stage 4 chronic kidney disease, or unspecified chronic kidney disease: Secondary | ICD-10-CM | POA: Diagnosis not present

## 2015-05-27 DIAGNOSIS — N189 Chronic kidney disease, unspecified: Secondary | ICD-10-CM | POA: Diagnosis not present

## 2015-05-28 ENCOUNTER — Ambulatory Visit: Payer: Medicare Other | Admitting: Occupational Therapy

## 2015-05-28 ENCOUNTER — Encounter: Payer: Self-pay | Admitting: Occupational Therapy

## 2015-05-28 ENCOUNTER — Ambulatory Visit: Payer: Medicare Other | Attending: Physical Medicine & Rehabilitation | Admitting: Physical Therapy

## 2015-05-28 VITALS — BP 169/85 | HR 66

## 2015-05-28 DIAGNOSIS — R2689 Other abnormalities of gait and mobility: Secondary | ICD-10-CM | POA: Insufficient documentation

## 2015-05-28 DIAGNOSIS — G8111 Spastic hemiplegia affecting right dominant side: Secondary | ICD-10-CM | POA: Diagnosis not present

## 2015-05-28 DIAGNOSIS — M6281 Muscle weakness (generalized): Secondary | ICD-10-CM | POA: Insufficient documentation

## 2015-05-28 DIAGNOSIS — I69318 Other symptoms and signs involving cognitive functions following cerebral infarction: Secondary | ICD-10-CM | POA: Diagnosis not present

## 2015-05-28 DIAGNOSIS — M79601 Pain in right arm: Secondary | ICD-10-CM

## 2015-05-28 DIAGNOSIS — R278 Other lack of coordination: Secondary | ICD-10-CM

## 2015-05-28 DIAGNOSIS — R208 Other disturbances of skin sensation: Secondary | ICD-10-CM | POA: Diagnosis not present

## 2015-05-28 DIAGNOSIS — R41842 Visuospatial deficit: Secondary | ICD-10-CM | POA: Insufficient documentation

## 2015-05-28 DIAGNOSIS — R293 Abnormal posture: Secondary | ICD-10-CM | POA: Diagnosis not present

## 2015-05-28 NOTE — Therapy (Signed)
[facility] [address] [place], [place], [address] Phone: [phone]   Fax:  [phone]  Physical Therapy Treatment  Patient Details  Name: Michelle Holmes MRN: [id number] Date of Birth: 1950-05-11 Referring Provider: Alysia Penna, MD  Encounter Date: 05/28/2015      PT End of Session - 05/28/15 1039    Visit Number 5   Number of Visits 17   Date for PT Re-Evaluation 06/30/15   Authorization Type [employer] MCR - G Codes and PN required every 10 visits   PT Start Time 0931   PT Stop Time 1016   PT Time Calculation (min) 45 min   Equipment Utilized During Treatment Gait belt   Activity Tolerance Patient tolerated treatment well   Behavior During Therapy --  Initially requirred max encouragement to participate      Past Medical History  Diagnosis Date  . Emphysema   . Colon cancer ([place])   . Hypertension   . Hypercholesteremia   . Diabetes mellitus   . COPD (chronic obstructive pulmonary disease) ([place])   . Coronary artery disease   . Obesity   . Sleep apnea   . TIA (transient ischemic attack)   . CHF (congestive heart failure) ([place])   . Anemia   . DJD (degenerative joint disease)   . Renal insufficiency   . Chronic back pain   . Scoliosis   . Obesity hypoventilation syndrome ([place])   . Stroke ([place])   . Myocardial infarction [facility])     Past Surgical History  Procedure Laterality Date  . Coronary angioplasty with stent placement    . Colon surgery      Filed Vitals:   05/28/15 0938 05/28/15 0955  BP: 168/96 169/85  Pulse: 65 66        Subjective Assessment - 05/28/15 0936    Subjective Pt again arrived early for today's session. Reports no falls, no significant changes.    Pertinent History PMH significant for: CVA x2 (most recent: 01/13/15), HTN, HLD, CHF, COPD, OSA, CAD, DM II, colon cancer, DJD, scoliosis, chronic renal insufficiency   Patient Stated Goals "I want a new wheelchair and I want to  walk."   Currently in Pain? No/denies         Postural Assessment Scale for Stroke Patients (PASS)  Give the subject instructions for each item as written below. When scoring the item, record the lowest response category that applies for each item.  Maintaining a Posture  _3_ 1. Sitting Without Support Instructions: Have the subject sit on a bench/mat without back support and with feet flat on the floor. (3) Can sit for 5 minutes without support (2) Can sit for more than 10 seconds without support (1) Can sit with slight support (for example, by 1 hand) (0) Cannot sit  _2_ 2. Standing With Support Instructions: Have the subject stand, providing support as needed. Evaluate only the ability to stand with or without support. Do not consider the quality of the stance. (3) Can stand with support of only 1 hand (2) Can stand with moderate support of 1 person (1) Can stand with strong support of 2 people (0) Cannot stand, even with support  _0_ 3. Standing Without Support Instructions: Have the subject stand without support. Evaluate only the ability to stand with or without support. Do not consider the quality of the stance. (3) Can stand without support for more than 1 minute and simultaneously perform arm movements at about shoulder level (2)  Can stand without support for 1 minute or stands slightly asymmetrically  (1) Can stand without support for 10 seconds or leans heavily on 1 leg (0) Cannot stand without support  _0_ 4. Standing on Nonparetic Leg Instructions: Have the subject stand on the nonparetic leg. Evaluate only the ability to bear weight entirely on the nonparetic leg. Do not consider how the subject accomplishes the task. (3) Can stand on nonparetic leg for more than 10 seconds (2) Can stand on nonparetic leg for more than 5 seconds (1) Can stand on nonparetic leg for a few seconds (0) Cannot stand on nonparetic leg  _0_ 5. Standing on Paretic Leg Instructions:  Have the subject stand on the paretic leg. Evaluate only the ability to bear weight entirely on the paretic leg. Do not consider how the subject accomplishes the task. (3) Can stand on paretic leg for more than 10 seconds (2) Can stand on paretic leg for more than 5 seconds (1) Can stand on paretic leg for a few seconds (0) Cannot stand on paretic leg  Maintaining Posture SUBTOTAL ___5_____  Changing a Posture  _2_ 6. Supine to Paretic Side Lateral Instructions: Begin with the subject in supine on a treatment mat. Instruct the subject to roll to the paretic side (lateral movement). Assist as necessary. Evaluate the subject's performance on the amount of help required. Do not consider the quality of performance. (3) Can perform without help (2) Can perform with little help (1) Can perform with much help (0) Cannot perform  _1_ 7. Supine to Nonparetic Side Lateral Instructions: Begin with the subject in supine on a treatment mat. Instruct the subject to roll to the nonparetic side (lateral movement). Assist as necessary. Evaluate the subject's performance on the amount of help required. Do not consider the quality of performance. (3) Can perform without help (2) Can perform with little help (1) Can perform with much help (0) Cannot perform  _1_ 8. Supine to Sitting Up on the Edge of the Mat Instructions: Begin with the subject in supine on a treatment mat. Instruct the subject to come to sitting on the edge of the mat. Assist as necessary. Evaluate the subject's performance on the amount of help required. Do not consider the quality of performance. (3) Can perform without help (2) Can perform with little help (1) Can perform with much help (0) Cannot perform  _1_ 9. Sitting on the Edge of the Mat to Supine Instructions: Begin with the on the edge of a treatment mat. Instruct the subject to return to supine. Assist as necessary. Evaluate the subject's performance on the amount of help  required. Do not consider the quality of performance. (3) Can perform without help (2) Can perform with little help (1) Can perform with much help (0) Cannot perform  _1_ 10. Sitting to Standing Up Instructions: Begin with the subject sitting on the edge of a treatment mat. Instruct the subject to stand up without support. Assist if necessary. Evaluate the subject's performance on the amount of help required. Do not consider the quality of performance. (3) Can perform without help (2) Can perform with little help (1) Can perform with much help (0) Cannot perform  _1_ 11. Standing Up to Sitting Down Instructions: Begin with the subject standing by edge of a treatment mat. Instruct the subject to sit on edge of mat without support. Assist if necessary. Evaluate the subject's performance on the amount of help required. Do not consider the quality of performance. (3) Can  perform without help (2) Can perform with little help (1) Can perform with much help (0) Cannot perform  _0_ 12. Standing, Picking Up a Pencil from the Floor Instructions: Begin with the subject standing. Instruct the subject to pick up a pencil fro the floor without support. Assist if necessary. Evaluate the subject's performance on the amount of help required. Do not consider the quality of performance. (3) Can perform without help (2) Can perform with little help (1) Can perform with much help (0) Cannot perform  Changing Posture SUBTOTAL __7___   TOTAL __12___                  [facility] Adult PT Treatment/Exercise - 05/28/15 0001    Bed Mobility   Supine to Sit 3: Mod assist   Supine to Sit Details (indicate cue type and reason) Provided cueing for technique with emphasis on use of LLE to manage RLE; however, pt unable to fully lift LE's and therefore required mod A for BLE management   Sit to Supine 3: Mod assist   Sit to Supine - Details (indicate cue type and reason) Multimodal cueing for lofroll  technique, LUE movement across midline, advancement of BLE's off EOM; required physical assist for final 50% of transition from R sidelying > sit.   Transfers   Transfers Lateral/Scoot Transfers;Sit to Stand;Stand to Sit  See PASS (in therapy note) for additional details   Sit to Stand 2: Max assist;3: Mod assist;4: Min assist   Sit to Stand Details Verbal cues for sequencing;Verbal cues for technique;Verbal cues for precautions/safety;Manual facilitation for weight bearing;Manual facilitation for weight shifting;Manual facilitation for placement   Sit to Stand Details (indicate cue type and reason) x1 trial from EOM (neutral height) with Max A, multimodal cueing for LE placement, setup, technique, RLE WB, and upright posture; x2 trials from personal w/c with LUE support at // bars; initial trial with mod A, subsequent trial with min A   Stand to Sit 3: Mod assist;4: Min assist  x2 trials with LUE support at // bvars   Stand to Sit Details (indicate cue type and reason) Verbal cues for technique;Verbal cues for sequencing   Stand to Sit Details Initial trial: tactile cueing for anterior weight shift. Second trial: verbal cueing for anterior weight shift and controlled descent.   Lateral/Scoot Transfers 2: Max assist;4: Education officer, environmental Details (indicate cue type and reason) Max A to R side; Min A to L side with PT seated in standard chair in front of pt; pt with LUE at chair arm to facilitate full anterior weight shift. Tactile cueing at R knee for proprioceptive input, WB. Manual positioning of RLE, max cueing for positioning of LLE with inconsistent  within-session carryover.                PT Education - 05/28/15 1030    Education provided Yes   Education Details Reviewed logroll technique for bed mobility, transfers, and sit <> stand.   Person(s) Educated Patient   Methods Explanation;Demonstration;Tactile cues;Verbal cues   Comprehension Verbalized  understanding;Returned demonstration          PT Short Term Goals - 05/28/15 0941    PT SHORT TERM GOAL #1   Title Pt will demonstrate consistent carryover of logroll technique for supine <> sit for R-side WB, increased efficiency of movement, and increased independence with bed mobility.   (Target date: 05/29/15)   Baseline 5/2: REVISED goal from formal HEP to between-session carryover of  logroll technique for bed mobility due to cognitive impairments.   Status Revised   PT SHORT TERM GOAL #2   Title Pt will perform supine <> sit with supervision, min cueing to indicate increased independence getting into/out of bed.   Baseline 5/2: Required mod A and max cueing for supine <> sit   Status Not Met   PT SHORT TERM GOAL #3   Title Pt will perform level transfers from w/c <> mat table in B directions with min A, min cueing to indicate increased independence with transfers.   Baseline 5/2: Max A to R side; Min A to L side   Status Partially Met   PT SHORT TERM GOAL #4   Title Pt will perform sit <> stand from w/c with min A, min cueing with LRAD to indicate increased pt independence with functional transfers.   Baseline 5/2: REVISED to add LRAD   Status Revised   PT SHORT TERM GOAL #5   Title Pt will perform dynamic standing task x2 consecutive minutes with single UE support and min to indicate improved postural awareness/control with functional standing.   Status On-going   PT SHORT TERM GOAL #6   Title ATP will assess current w/c to assist in determining if pt appropriate candidate for custom w/c to improve positioning, preserve skin integrity, and increase pt independence with mobility.    Baseline Met 4/25.   Status Achieved   PT SHORT TERM GOAL #7   Title Pt will improve PASS score from 12/36 to 15/36 to indicate improved independence iwth funcitonal mobility.   Status On-going           PT Long Term Goals - 05/01/15 1501    PT LONG TERM GOAL #1   Title Pt will consistently  perform supine <> sit with mod I, no cueing to indicate increased independence getting into/out of bed.  (Target date: 06/26/15)   PT LONG TERM GOAL #2   Title Pt will perform level transfers from w/c <> mat table in B directions with supervision to indicate increased independence with transfers.   PT LONG TERM GOAL #3   Title Pt will perform unlevel transfers in B directions with min A, min cueing for increased safety/independence with functional transfers.   PT LONG TERM GOAL #4   Title Pt will perform sit <> stand from w/c with supervision to indicate increased pt independence with functional transfers.   PT LONG TERM GOAL #5   Title Pt will improve PASS score from 12/36 to 18/36 to indicate significantly improved independence with functional mobility.   Additional Long Term Goals   Additional Long Term Goals Yes   PT LONG TERM GOAL #6   Title Will attempt ambulation and set goal, if appropriate, to assess if safe/functional to address short-distance household ambulation.               Plan - 05/28/15 1046    Clinical Impression Statement Session focused on beginning to assess STG's. Revised goal for formal HEP to effective within-session carryover of logrol technique for bed mobility, as cognitive impairments limit pt carryover. Pt partially met transfer goal, as she required min A to L and Max A to R side. PASS score unchanged since PT evaluation. Pt progress in PT has been limited by inconsistent attendance; this has improved as pt has been present (and early) for past 2 PT sessions.    Rehab Potential Fair   Clinical Impairments Affecting Rehab Potential cognitive impairments; limited family support;  relies on friend for transportation   PT Frequency 2x / week   PT Duration 8 weeks   PT Treatment/Interventions ADLs/Self Care Home Management;DME Instruction;Electrical Stimulation;Vestibular;Gait training;Neuromuscular re-education;Stair training;Functional mobility  training;Therapeutic activities;Patient/family education;Therapeutic exercise;Balance training;Prosthetic Training;Orthotic Fit/Training;Wheelchair mobility training   PT Next Visit Plan Finish checking STG's. If time, reassess PASS items for rolling and supine <> sit (expect improvement after reinforcement today). Remind pt to bring personal AFO to next session.    Consulted and Agree with Plan of Care Patient      Patient will benefit from skilled therapeutic intervention in order to improve the following deficits and impairments:  Postural dysfunction, Impaired tone, Abnormal gait, Decreased balance, Decreased activity tolerance, Decreased endurance, Decreased coordination, Decreased mobility, Impaired perceived functional ability, Impaired sensation, Decreased knowledge of use of DME, Decreased strength  Visit Diagnosis: Spastic hemiplegia affecting right dominant side (HCC)  Other abnormalities of gait and mobility  Abnormal posture     Problem List Patient Active Problem List   Diagnosis Date Noted  . Absolute anemia   . Thrombocytopenia ([place])   . 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 AKI (acute kidney injury) ([place])   . Dysarthria   . Lethargy   . DM type 2 with diabetic peripheral neuropathy ([place])   . Labile blood pressure   . Hyperlipidemia   . History of CVA with residual deficit   . Chronic obstructive pulmonary disease ([place])   . Hemiparesis, aphasia, and dysphagia as late effect of cerebrovascular accident (CVA) ([place])   . Acute ischemic stroke ([place])   . CVA (cerebral vascular accident) ([place]) 01/14/2015  . CVA (cerebral infarction) 01/14/2015  . Right hemiplegia ([place])   . Right sided weakness 01/13/2015  . Abdominal pain 06/20/2014  . Altered mental status 02/08/2012  . Sleep apnea 12/08/2010  . Morbid obesity ([place]) 12/08/2010  . CHF (congestive heart failure) ([place]) 12/08/2010  . Chronic renal failure 12/08/2010  . TIA (transient ischemic attack)  12/08/2010  . CAD (coronary artery disease) 12/08/2010  . Cellulitis of pubic region 12/01/2010  . Uncontrolled type 2 DM with hyperosmolar nonketotic hyperglycemia ([place]) 12/01/2010    Billie Ruddy, PT, [facility] [address] [place], [place], [address] Phone: [phone]   Fax:  [phone] 05/28/2015, 10:56 AM  Name: Michelle Holmes MRN: [id number] Date of Birth: October 26, 1950

## 2015-05-28 NOTE — Therapy (Signed)
[facility] [address] [place], [place], [address] Phone: [phone]   Fax:  [phone]  Occupational Therapy Evaluation  Patient Details  Name: Michelle Holmes MRN: [id number] Date of Birth: Jun 11, 1950 Referring Provider: Dr. Alysia Penna  Encounter Date: 05/28/2015      OT End of Session - 05/28/15 2154    Visit Number 1   Number of Visits 9   Date for OT Re-Evaluation 06/28/15   Authorization Type [employer] Medicare (no visit limit, no auth, G-code needed); medicaid 2nd   Authorization Time Period cert. 05/28/15-07/27/15   Authorization - Visit Number 1   Authorization - Number of Visits 10  G   OT Start Time 1104   OT Stop Time 1147   OT Time Calculation (min) 43 min   Activity Tolerance Patient limited by lethargy  decr participation and refusal to participate at times   Behavior During Therapy Flat affect  required max prompts and encouragement for participation, but still refused to participate at times      Past Medical History  Diagnosis Date  . Emphysema   . Colon cancer ([place])   . Hypertension   . Hypercholesteremia   . Diabetes mellitus   . COPD (chronic obstructive pulmonary disease) ([place])   . Coronary artery disease   . Obesity   . Sleep apnea   . TIA (transient ischemic attack)   . CHF (congestive heart failure) ([place])   . Anemia   . DJD (degenerative joint disease)   . Renal insufficiency   . Chronic back pain   . Scoliosis   . Obesity hypoventilation syndrome ([place])   . Stroke ([place])   . Myocardial infarction [facility])     Past Surgical History  Procedure Laterality Date  . Coronary angioplasty with stent placement    . Colon surgery      There were no vitals filed for this visit.      Subjective Assessment - 05/28/15 1114    Subjective  Pt did not answer questions/respond/acknowlege OT multiple times during evaluation and refused to participate at times (by shaking head or saying "no").   Pt reports that she does not want to work on dressing.   Patient is accompained by: --  no caregiver available for eval   Pertinent History CVA 01/13/16, hx of previous CVA with risidual R-sided weakness, hospitalization 05/05/15 with possible TIA or seizure disorder; also hx of HTN, MI, CHF, CAD, DM, COPD, emphysema, sleep apnea, chronic back pain, scoliosis, DJD, renal insufficiency, anemia, and colon cancer.   Limitations cognitive deficits, decr participation   Patient Stated Goals get back to "all that I did before"; when questioned pt stated that she wanted to work on toileting transfer ( but inconsistent responses)   Currently in Pain? 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 Alysia Penna   Onset Date 01/13/15   Prior Therapy hospitalized 01/13/15-02/07/15, 4/9/-05/07/15 due to possible TIA or seizure   Precautions   Precautions Fall   Balance Screen   Has the patient fallen in the past 6 months No   Home  Environment   Family/patient expects to be discharged to: Private residence   Living Arrangements --   Type of [place] One level   Lives With Alone  but family, friends are always there (24 hr supervision)   Prior Function   Level of Independence Independent with basic ADLs  per pt    ADL   Eating/Feeding --   Grooming --   Upper Body Bathing --   Lower Body Bathing  --   Upper Body Dressing --   Lower Body Dressing --   Transfers/Ambulation Related to ADL's per PT, min A to unaffected side, max A to affected side on level surfaces   ADL comments Unable to fully assess ADLs due to inconsistent reports/answers from pt and decr participation for commands and functional activities.  Pt did not attempt to lift either foot while sitting in w/c when asked.  Pt was able to reaching down with LUE to calf and return to sitting, but fefused to attempt to doff jacket.  Anticipated mod-max A, but unsure based on decr participation in evaluation today and no caregiver available.   IADL   Prior Level of Function [employer] per hospital records, pt was receiving assist from family prior to 12/2014 CVA   Mobility   Mobility Status Comments w/c bound per pt.     Written Expression   Dominant Hand Right   Vision Assessment   Vision Assessment Vision not tested   Comment Pt with limited head turning to the R (inattention).  Pt frequently closed eyes during evaluation and demo decr alertness.   Cognition   Area of Impairment Attention;Memory;Awareness;Following commands   Attention Comments pt with decr alertness during eval (closed eyes for periods), decr response to questions/directions at times,despite cueing/prompts   Memory Comments Pt with inconsistent responses at times and conflicting answers when compared with hospitalization records    Following Commands Follows one step commands inconsistently   Awareness Emergent;Intellectual   Problem Solving Impaired   Executive Function Initiating;Decision Making  impaired   Decision Making Impaired   Initiating Impaired   Initiating Impairment Verbal basic;Functional basic   Behaviors Lability;Verbal agitation;Poor frustration tolerance  poor participation   Sensation   Additional Comments unable to test   Coordination   Coordination no active movement with RUE   Perception   Perception Impaired    Inattention/Neglect Impaired - to be further tested in functual context  does not consistently attend to R side or turn to the R    Tone   Assessment Location Right Upper Extremity   ROM / Strength   AROM / PROM / Strength AROM;PROM   AROM   Overall AROM  Deficits   Overall AROM Comments no RUE AROM demonstrated; pt with limited participation when asked to perform LUE AROM   PROM   Overall PROM  Deficits   Overall PROM Comments Multiple attempts to assess needed due to pt's refusal of slow gentle movement of RUE observing for signs of pain.  However, with max prompting and encouragement, able to assess the following (with PROM limited to pain): approx 25% R shoulder ROM,  approx 75% R finger ext, elbow ext approx 75%, supination to neutral, minimal wrist ext   RUE Tone   RUE Tone Moderate                  OT Treatments/Exercises (OP) - 05/28/15 2219    Neurological Re-education Exercises   Other Exercises 1 cued pt to lift RUE to tabletop using LUE.  After multiple verbal, demo, and tactile cues and encouragement, pt able to use LUE to place RUE on table for incr awareness/attention to RUE.   Other Exercises 2 Table slides shoulder flex/elbow ext self PROM within limited range with max prompts/cueing (verbal, demo, tactile facilitation).               OT Education - 05/28/15 2221    Education provided Yes   Education Details Importance of moving RUE to decr stiffness/pain; OT POC   Person(s) Educated Patient   Methods Explanation   Comprehension Verbalized understanding;Need further instruction             OT Long Term Goals - 05/28/15 2159    OT LONG TERM GOAL #1   Title Pt will perform self PROM HEP with mod prompts/cueing.--check goals 06/28/15   Time 4   Status New   OT LONG TERM GOAL #2   Title Pt will participate in functional activities for at least 72min with mod prompts.   Time 4   Status New   OT LONG TERM GOAL #3   Title Pt will tolerate/allow  gentle PROM to RUE at least 75%.   Time 4   Status New   OT LONG TERM GOAL #4   Title Pt will perform transfer from w/c to [facility] with mod A.               Plan - 05/28/15 2205    Clinical Impression Statement Pt is a 65 y.o. female s/p CVA with spastic R hemiplegia (dominant side) 01/13/15 with hospitalization through 02/07/15.  Pt with PMH that includes:  hx of previous CVA with risidual R-sided weakness, hospitalization 05/05/15 with possible TIA or szeizure disorder; also hx of HTN, MII, CHF, CAD, DM, COPD, emphysema, sleep apnea, chronic back pain, scoliosis, DJD, renal insufficiency, anemia, and colon cancer.  Pt presents today with significant cognitive deficits, R inattention, R spastic hemiplegia with decr ROM, pain, decr functional mobility, decr coordination affecting dominant RUE functional use and ability to participate in ADLs/IADLs.  Pt would benefit from occupational therapy to address these deficits to decr caregiver burden, prevent future complications, ncr participation in functional tasks, and improve quality of life.     Rehab Potential Fair   Clinical Impairments Affecting Rehab Potential severity of deficits, cognitive deficits, behavior/decr participation, R inattention, no caregiver present during evaluation, inconsistent performance per PT (current with PT), ?caregiver involvement in therapy as no caregiver has been available for previous PT sessions or for OT evaluation today   OT Frequency 2x / week   OT Duration 4 weeks  +eval   OT Treatment/Interventions Self-care/ADL training;Therapeutic exercise;Functional Mobility Training;Patient/family education;Balance training;Splinting;Manual Therapy;Neuromuscular education;Ultrasound;Energy conservation;Manual lymph drainage;Therapeutic exercises;Therapeutic activities;DME and/or AE instruction;Parrafin;Cryotherapy;Electrical Stimulation;Fluidtherapy;Cognitive remediation/compensation;Visual/perceptual  remediation/compensation;Passive range of motion;Contrast Bath;Moist Heat   Plan transfer to [facility], gentle PROM/self PROM to RUE; Pt would benefit from trial of occupational therapy to determine level of participation/carryover.  Pt may benefit from longer duration if participation increases.   Recommended Other Services pt is receiving physical therapy   Consulted and Agree with Plan of Care  Patient      Patient will benefit from skilled therapeutic intervention in order to improve the following deficits and impairments:     Visit Diagnosis: Spastic hemiplegia affecting right dominant side (HCC)  Visuospatial deficit  Other lack of coordination  Other symptoms and signs involving cognitive functions following cerebral infarction  Pain In Right Arm  Muscle weakness (generalized)  Other abnormalities of gait and mobility      G-Codes - 05/31/2015 04-22-2222    Functional Assessment Tool Used clinical judgement, participation in functional task, anticipated/reported mod-max A for ADLs   Functional Limitation Self care   Self Care Current Status [id number]) At least 80 percent but less than 100 percent impaired, limited or restricted   Self Care Goal Status [id number]) At least 60 percent but less than 80 percent impaired, limited or restricted      Problem List Patient Active Problem List   Diagnosis Date Noted  . Absolute anemia   . Thrombocytopenia ([place])   . Acute ischemic VBA thalamic stroke ([place]) 01/18/2015  . AKI (acute kidney injury) ([place])   . Dysarthria   . Lethargy   . DM type 2 with diabetic peripheral neuropathy ([place])   . Labile blood pressure   . Hyperlipidemia   . History of CVA with residual deficit   . Chronic obstructive pulmonary disease ([place])   . Hemiparesis, aphasia, and dysphagia as late effect of cerebrovascular accident (CVA) ([place])   . Acute ischemic stroke ([place])   . CVA (cerebral vascular accident) ([place]) 01/14/2015  . CVA (cerebral infarction) 01/14/2015  . Right  hemiplegia ([place])   . Right sided weakness 01/13/2015  . Abdominal pain 06/20/2014  . Altered mental status 02/08/2012  . Sleep apnea 12/08/2010  . Morbid obesity ([place]) 12/08/2010  . CHF (congestive heart failure) ([place]) 12/08/2010  . Chronic renal failure 12/08/2010  . TIA (transient ischemic attack) 12/08/2010  . CAD (coronary artery disease) 12/08/2010  . Cellulitis of pubic region 12/01/2010  . Uncontrolled type 2 DM with hyperosmolar nonketotic hyperglycemia ([place]) 12/01/2010    [facility] 2015-05-31, 10:26 PM  [facility] [address] [place], [place], [address] Phone: [phone]   Fax:  [phone]  Name: Michelle Holmes MRN: [id number] Date of Birth: Apr 10, 1950  Vianne Bulls, OTR/L [facility] [address] [place], [place]  [address] [phone] phone [phone] 2015/05/31 10:26 PM

## 2015-05-31 ENCOUNTER — Encounter: Payer: Self-pay | Admitting: Occupational Therapy

## 2015-05-31 ENCOUNTER — Ambulatory Visit: Payer: Medicare Other | Admitting: Physical Therapy

## 2015-05-31 ENCOUNTER — Encounter: Payer: Self-pay | Admitting: Physical Therapy

## 2015-05-31 ENCOUNTER — Ambulatory Visit: Payer: Medicare Other | Admitting: Occupational Therapy

## 2015-05-31 DIAGNOSIS — I69318 Other symptoms and signs involving cognitive functions following cerebral infarction: Secondary | ICD-10-CM | POA: Diagnosis not present

## 2015-05-31 DIAGNOSIS — M79601 Pain in right arm: Secondary | ICD-10-CM | POA: Diagnosis not present

## 2015-05-31 DIAGNOSIS — R2689 Other abnormalities of gait and mobility: Secondary | ICD-10-CM

## 2015-05-31 DIAGNOSIS — G8111 Spastic hemiplegia affecting right dominant side: Secondary | ICD-10-CM | POA: Diagnosis not present

## 2015-05-31 DIAGNOSIS — R208 Other disturbances of skin sensation: Secondary | ICD-10-CM | POA: Diagnosis not present

## 2015-05-31 DIAGNOSIS — M6281 Muscle weakness (generalized): Secondary | ICD-10-CM

## 2015-05-31 DIAGNOSIS — R41842 Visuospatial deficit: Secondary | ICD-10-CM

## 2015-05-31 DIAGNOSIS — R278 Other lack of coordination: Secondary | ICD-10-CM

## 2015-05-31 DIAGNOSIS — R293 Abnormal posture: Secondary | ICD-10-CM

## 2015-05-31 NOTE — Therapy (Signed)
[facility] [address] [place], [place], [address] Phone: [phone]   Fax:  [phone]  Occupational Therapy Treatment  Patient Details  Name: Michelle Holmes MRN: [id number] Date of Birth: 23-Jun-1950 Referring Provider: Dr. Alysia Penna  Encounter Date: 05/31/2015      OT End of Session - 05/31/15 1422    OT Stop Time --  Transportation issues      Past Medical History  Diagnosis Date  . Emphysema   . Colon cancer ([place])   . Hypertension   . Hypercholesteremia   . Diabetes mellitus   . COPD (chronic obstructive pulmonary disease) ([place])   . Coronary artery disease   . Obesity   . Sleep apnea   . TIA (transient ischemic attack)   . CHF (congestive heart failure) ([place])   . Anemia   . DJD (degenerative joint disease)   . Renal insufficiency   . Chronic back pain   . Scoliosis   . Obesity hypoventilation syndrome ([place])   . Stroke ([place])   . Myocardial infarction [facility])     Past Surgical History  Procedure Laterality Date  . Coronary angioplasty with stent placement    . Colon surgery      There were no vitals filed for this visit.      Subjective Assessment - 05/31/15 1052    Subjective  My arm isn't hurting like it did.     Pertinent History CVA 01/13/16, hx of previous CVA with risidual R-sided weakness, hospitalization 05/05/15 with possible TIA or seizure disorder; also hx of HTN, MI, CHF, CAD, DM, COPD, emphysema, sleep apnea, chronic back pain, scoliosis, DJD, renal insufficiency, anemia, and colon cancer.   Patient Stated Goals get back to "all that I did before"; when questioned pt stated that she wanted to work on toileting transfer ( but inconsistent responses)   Currently in Pain? No/denies   Pain Score 0-No pain                      OT Treatments/Exercises (OP) - 05/31/15 1053    Transfers   Transfers Sit to Stand;Stand to Sit   Sit to Stand 4: Min assist   Sit to Stand  Details (indicate cue type and reason) facilitating forward weight transfer   Stand to Sit 4: Min assist   Stand to Sit Details Facilitating forward weight shift   Neurological Re-education Exercises   Other Exercises 1 Neuromuscular reeducation to address ability to shift weight toward right side.  Patient with strong preference to lean to left in sitting, and with attempts to passively move her right arm, this leads to pain.  When patient gently and actively shifts body weight toward right - she has reduction in tension in right arm, and reduced pain.  Patient able to isolate shoulder flexion and extension with minimal guidance today,. and cueing to not use body movement to move arm.  Patient able to flex fingers and thumb today, and was able to introduce gross grasp, and lateral pinch.       Other Exercises 2 Worked on patient's ability to shift weight forward in preparation for transfer.  With guidance for weight shift forward, able to transfer to right side with minimal assistance.  Patient able to verbalize fear of forward weight shift, and therefore sitting directly in front was effective today.                  OT Education -  05/31/15 1412    Education provided Yes   Education Details Importance of moving body toward right side to avoid arm stiffness and pain   Person(s) Educated Patient   Methods Explanation;Demonstration   Comprehension Verbal cues required;Tactile cues required;Need further instruction             OT Long Term Goals - 05/31/15 1419    OT LONG TERM GOAL #1   Title Pt will perform self PROM HEP with mod prompts/cueing.--check goals 06/28/15   Status On-going   OT LONG TERM GOAL #2   Title Pt will participate in functional activities for at least 74min with mod prompts.   Status On-going   OT LONG TERM GOAL #3   Title Pt will tolerate/allow gentle PROM to RUE at least 75%.   Status On-going   OT LONG TERM GOAL #4   Title Pt will perform transfer from  w/c to [facility] with mod A.   Status On-going               Plan - 05/31/15 1415    Clinical Impression Statement Patient with improved ability to attend to therapy session today.  Patient with significant edema and stiffness in right wrist and digits.     Rehab Potential Fair   Clinical Impairments Affecting Rehab Potential limited caregiver support   OT Frequency 2x / week   OT Duration 4 weeks   OT Treatment/Interventions Self-care/ADL training;Therapeutic exercise;Functional Mobility Training;Patient/family education;Balance training;Splinting;Manual Therapy;Neuromuscular education;Ultrasound;Energy conservation;Manual lymph drainage;Therapeutic exercises;Therapeutic activities;DME and/or AE instruction;Parrafin;Cryotherapy;Electrical Stimulation;Fluidtherapy;Cognitive remediation/compensation;Visual/perceptual remediation/compensation;Passive range of motion;Contrast Bath;Moist Heat   Plan transfer to / from bedside commode, postural control, NMR RUE   Consulted and Agree with Plan of Care Patient      Patient will benefit from skilled therapeutic intervention in order to improve the following deficits and impairments:  Decreased activity tolerance, Decreased balance, Decreased cognition, Decreased coordination, Decreased endurance, Decreased range of motion, Decreased mobility, Decreased knowledge of use of DME, Decreased knowledge of precautions, Decreased strength, Difficulty walking, Increased edema, Impaired perceived functional ability, Impaired flexibility, Obesity, Impaired UE functional use, Impaired tone, Pain, Impaired vision/preception, Improper body mechanics  Visit Diagnosis: Spastic hemiplegia affecting right dominant side (HCC)  Pain In Right Arm  Muscle weakness (generalized)  Visuospatial deficit  Other lack of coordination  Other symptoms and signs involving cognitive functions following cerebral infarction  Other abnormalities of gait and  mobility    Problem List Patient Active Problem List   Diagnosis Date Noted  . Absolute anemia   . Thrombocytopenia ([place])   . Acute ischemic VBA thalamic stroke ([place]) 01/18/2015  . AKI (acute kidney injury) ([place])   . Dysarthria   . Lethargy   . DM type 2 with diabetic peripheral neuropathy ([place])   . Labile blood pressure   . Hyperlipidemia   . History of CVA with residual deficit   . Chronic obstructive pulmonary disease ([place])   . Hemiparesis, aphasia, and dysphagia as late effect of cerebrovascular accident (CVA) ([place])   . Acute ischemic stroke ([place])   . CVA (cerebral vascular accident) ([place]) 01/14/2015  . CVA (cerebral infarction) 01/14/2015  . Right hemiplegia ([place])   . Right sided weakness 01/13/2015  . Abdominal pain 06/20/2014  . Altered mental status 02/08/2012  . Sleep apnea 12/08/2010  . Morbid obesity ([place]) 12/08/2010  . CHF (congestive heart failure) ([place]) 12/08/2010  . Chronic renal failure 12/08/2010  . TIA (transient ischemic attack) 12/08/2010  . CAD (coronary artery disease) 12/08/2010  .  Cellulitis of pubic region 12/01/2010  . Uncontrolled type 2 DM with hyperosmolar nonketotic hyperglycemia ([place]) 12/01/2010    Mariah Milling, OTR/L 05/31/2015, 2:23 PM  [facility] [address] [place], [place], [address] Phone: [phone]   Fax:  [phone]  Name: AMAR GROSHONG MRN: [id number] Date of Birth: 05/01/1950

## 2015-06-03 NOTE — Therapy (Signed)
[facility] [address] [place], [place], [address] Phone: [phone]   Fax:  [phone]  Physical Therapy Treatment  Patient Details  Name: Michelle Holmes MRN: [id number] Date of Birth: 1950-09-23 Referring Provider: Alysia Penna, MD  Encounter Date: 05/31/2015   05/31/15 0938  PT Visits / Re-Eval  Visit Number 6  Number of Visits 17  Date for PT Re-Evaluation 06/30/15  Authorization  Authorization Type UHC MCR - G Codes and PN required every 10 visits  PT Time Calculation  PT Start Time 0932  PT Stop Time 1013  PT Time Calculation (min) 41 min  PT - End of Session  Equipment Utilized During Treatment Gait belt  Activity Tolerance Patient tolerated treatment well     Past Medical History  Diagnosis Date  . Emphysema   . Colon cancer ([place])   . Hypertension   . Hypercholesteremia   . Diabetes mellitus   . COPD (chronic obstructive pulmonary disease) ([place])   . Coronary artery disease   . Obesity   . Sleep apnea   . TIA (transient ischemic attack)   . CHF (congestive heart failure) ([place])   . Anemia   . DJD (degenerative joint disease)   . Renal insufficiency   . Chronic back pain   . Scoliosis   . Obesity hypoventilation syndrome ([place])   . Stroke ([place])   . Myocardial infarction [facility])     Past Surgical History  Procedure Laterality Date  . Coronary angioplasty with stent placement    . Colon surgery      There were no vitals filed for this visit.     05/31/15 0937  Symptoms/Limitations  Subjective Pt early for appt again today (demo's improved attendance/complaince with PT). No falls reported. No new issues reported.  Pertinent History PMH significant for: CVA x2 (most recent: 01/13/15), HTN, HLD, CHF, COPD, OSA, CAD, DM II, colon cancer, DJD, scoliosis, chronic renal insufficiency  Patient Stated Goals "I want a new wheelchair and I want to walk."  Pain Assessment  Currently in Pain?  No/denies  Pain Score 0      05/31/15 0952  Bed Mobility  Rolling Right 4: Min guard;5: Set up  Rolling Right Details (indicate cue type and reason) x 4 reps with cues, decreased assist with repetition  Rolling Left 4: Min assist  Rolling Left Details (indicate cue type and reason) x4 with cues, decrease assist with repetition, assist mostly for bringing right UE/shoulder forwrad  Left Sidelying to Sit 4: Min assist  Left Sidelying to Sit Details (indicate cue type and reason) cues on sequencing  Sit to Sidelying Left 4: Min assist;HOB flat  Transfers  Transfers Floor to Transfer  Sit to Stand 3: Mod assist;With upper extremity assist;With armrests;From chair/3-in-1;From bed  Sit to Stand Details (indicate cue type and reason) x 3 reps with right knee blocked each time from standard height mat table, x 1 from wheelchair for stand<>pivot transfer.  Stand to Sit 3: Mod assist;With upper extremity assist;With armrests;To bed;To chair/3-in-1  Stand Pivot Transfers 2: Max assist  Stand Pivot Transfer Details (indicate cue type and reason) standard height chair to mat table.  Floor to Transfer 2: Max assist;With upper extremity assist (max to total assist of 2 for standing from floor)  Comments standing trials with right knee blocked to prevent buckling: lateral weight shifting, left UE raises/reaching for targets, mini squats, heel raises (more anterior weight shifting than heel raise), and left stepping in/out, fwd/bwd.  quad activation/min control noted with eccentric motion of mini squats on right leg.                                        PT Short Term Goals - 06/03/15 1233    PT SHORT TERM GOAL #1   Title Pt will demonstrate consistent carryover of logroll technique for supine <> sit for R-side WB, increased efficiency of movement, and increased independence with bed mobility.   (Target date: 05/29/15)   Baseline 5/2: REVISED goal from formal HEP to between-session carryover of  logroll technique for bed mobility due to cognitive impairments.   Status On-going   PT SHORT TERM GOAL #2   Title Pt will perform supine <> sit with supervision, min cueing to indicate increased independence getting into/out of bed.   Baseline 5/2: Required mod A and max cueing for supine <> sit   Status Not Met   PT SHORT TERM GOAL #3   Title Pt will perform level transfers from w/c <> mat table in B directions with min A, min cueing to indicate increased independence with transfers.   Baseline 5/2: Max A to R side; Min A to L side   Status Partially Met   PT SHORT TERM GOAL #4   Title Pt will perform sit <> stand from w/c with min A, min cueing with LRAD to indicate increased pt independence with functional transfers.   Baseline 5/2: REVISED to add LRAD   Status Revised   PT SHORT TERM GOAL #5   Title Pt will perform dynamic standing task x2 consecutive minutes with single UE support and min to indicate improved postural awareness/control with functional standing.   Status On-going   PT SHORT TERM GOAL #6   Title ATP will assess current w/c to assist in determining if pt appropriate candidate for custom w/c to improve positioning, preserve skin integrity, and increase pt independence with mobility.    Baseline Met 4/25.   Status Achieved   PT SHORT TERM GOAL #7   Title Pt will improve PASS score from 12/36 to 15/36 to indicate improved independence iwth funcitonal mobility.   Status Not Met           PT Long Term Goals - 05/01/15 1501    PT LONG TERM GOAL #1   Title Pt will consistently perform supine <> sit with mod I, no cueing to indicate increased independence getting into/out of bed.  (Target date: 06/26/15)   PT LONG TERM GOAL #2   Title Pt will perform level transfers from w/c <> mat table in B directions with supervision to indicate increased independence with transfers.   PT LONG TERM GOAL #3   Title Pt will perform unlevel transfers in B directions with min A, min  cueing for increased safety/independence with functional transfers.   PT LONG TERM GOAL #4   Title Pt will perform sit <> stand from w/c with supervision to indicate increased pt independence with functional transfers.   PT LONG TERM GOAL #5   Title Pt will improve PASS score from 12/36 to 18/36 to indicate significantly improved independence with functional mobility.   Additional Long Term Goals   Additional Long Term Goals Yes   PT LONG TERM GOAL #6   Title Will attempt ambulation and set goal, if appropriate, to assess if safe/functional to address short-distance household ambulation.  05/31/15 2103  Plan  Clinical Impression Statement Session continued to focus on bed mobility and transfer training. No complaints reported with activites performed in session.  Pt will benefit from skilled therapeutic intervention in order to improve on the following deficits Postural dysfunction;Impaired tone;Abnormal gait;Decreased balance;Decreased activity tolerance;Decreased endurance;Decreased coordination;Decreased mobility;Impaired perceived functional ability;Impaired sensation;Decreased knowledge of use of DME;Decreased strength  Rehab Potential Fair  Clinical Impairments Affecting Rehab Potential cognitive impairments; limited family support; relies on friend for transportation  PT Frequency 2x / week  PT Duration 8 weeks  PT Treatment/Interventions ADLs/Self Care Home Management;DME Instruction;Electrical Stimulation;Vestibular;Gait training;Neuromuscular re-education;Stair training;Functional mobility training;Therapeutic activities;Patient/family education;Therapeutic exercise;Balance training;Prosthetic Training;Orthotic Fit/Training;Wheelchair mobility training  PT Next Visit Plan continue to work on transfers and standing, strengthening, all towards LTGs.  Consulted and Agree with Plan of Care Patient          Patient will benefit from skilled therapeutic intervention in order  to improve the following deficits and impairments:  Postural dysfunction, Impaired tone, Abnormal gait, Decreased balance, Decreased activity tolerance, Decreased endurance, Decreased coordination, Decreased mobility, Impaired perceived functional ability, Impaired sensation, Decreased knowledge of use of DME, Decreased strength  Visit Diagnosis: Muscle weakness (generalized)  Other abnormalities of gait and mobility  Abnormal posture  Other disturbances of skin sensation     Problem List Patient Active Problem List   Diagnosis Date Noted  . Absolute anemia   . Thrombocytopenia ([place])   . Acute ischemic VBA thalamic stroke ([place]) 01/18/2015  . AKI (acute kidney injury) ([place])   . Dysarthria   . Lethargy   . DM type 2 with diabetic peripheral neuropathy ([place])   . Labile blood pressure   . Hyperlipidemia   . History of CVA with residual deficit   . Chronic obstructive pulmonary disease ([place])   . Hemiparesis, aphasia, and dysphagia as late effect of cerebrovascular accident (CVA) ([place])   . Acute ischemic stroke ([place])   . CVA (cerebral vascular accident) ([place]) 01/14/2015  . CVA (cerebral infarction) 01/14/2015  . Right hemiplegia ([place])   . Right sided weakness 01/13/2015  . Abdominal pain 06/20/2014  . Altered mental status 02/08/2012  . Sleep apnea 12/08/2010  . Morbid obesity ([place]) 12/08/2010  . CHF (congestive heart failure) ([place]) 12/08/2010  . Chronic renal failure 12/08/2010  . TIA (transient ischemic attack) 12/08/2010  . CAD (coronary artery disease) 12/08/2010  . Cellulitis of pubic region 12/01/2010  . Uncontrolled type 2 DM with hyperosmolar nonketotic hyperglycemia ([place]) 12/01/2010    Willow Ora, PTA, [facility] [address], [place], [place] [address] [phone] 06/03/2015, 12:34 PM   Name: Michelle Holmes MRN: [id number] Date of Birth: 04-12-1950

## 2015-06-04 ENCOUNTER — Ambulatory Visit: Payer: Self-pay | Admitting: Physical Therapy

## 2015-06-05 ENCOUNTER — Ambulatory Visit: Payer: Medicare Other | Admitting: Physical Therapy

## 2015-06-05 ENCOUNTER — Encounter: Payer: Self-pay | Admitting: Occupational Therapy

## 2015-06-05 ENCOUNTER — Encounter: Payer: Self-pay | Admitting: Physical Therapy

## 2015-06-05 VITALS — BP 161/86 | HR 66

## 2015-06-05 DIAGNOSIS — R2689 Other abnormalities of gait and mobility: Secondary | ICD-10-CM

## 2015-06-05 DIAGNOSIS — M6281 Muscle weakness (generalized): Secondary | ICD-10-CM

## 2015-06-05 DIAGNOSIS — R278 Other lack of coordination: Secondary | ICD-10-CM | POA: Diagnosis not present

## 2015-06-05 DIAGNOSIS — R208 Other disturbances of skin sensation: Secondary | ICD-10-CM | POA: Diagnosis not present

## 2015-06-05 DIAGNOSIS — I69318 Other symptoms and signs involving cognitive functions following cerebral infarction: Secondary | ICD-10-CM | POA: Diagnosis not present

## 2015-06-05 DIAGNOSIS — G8111 Spastic hemiplegia affecting right dominant side: Secondary | ICD-10-CM | POA: Diagnosis not present

## 2015-06-05 DIAGNOSIS — R293 Abnormal posture: Secondary | ICD-10-CM

## 2015-06-05 DIAGNOSIS — M79601 Pain in right arm: Secondary | ICD-10-CM | POA: Diagnosis not present

## 2015-06-06 ENCOUNTER — Ambulatory Visit: Payer: Medicare Other | Admitting: Occupational Therapy

## 2015-06-06 ENCOUNTER — Encounter: Payer: Self-pay | Admitting: Occupational Therapy

## 2015-06-06 DIAGNOSIS — I69318 Other symptoms and signs involving cognitive functions following cerebral infarction: Secondary | ICD-10-CM

## 2015-06-06 DIAGNOSIS — R278 Other lack of coordination: Secondary | ICD-10-CM | POA: Diagnosis not present

## 2015-06-06 DIAGNOSIS — R41842 Visuospatial deficit: Secondary | ICD-10-CM

## 2015-06-06 DIAGNOSIS — M79601 Pain in right arm: Secondary | ICD-10-CM

## 2015-06-06 DIAGNOSIS — R2689 Other abnormalities of gait and mobility: Secondary | ICD-10-CM | POA: Diagnosis not present

## 2015-06-06 DIAGNOSIS — G8111 Spastic hemiplegia affecting right dominant side: Secondary | ICD-10-CM | POA: Diagnosis not present

## 2015-06-06 DIAGNOSIS — R293 Abnormal posture: Secondary | ICD-10-CM | POA: Diagnosis not present

## 2015-06-06 DIAGNOSIS — R208 Other disturbances of skin sensation: Secondary | ICD-10-CM | POA: Diagnosis not present

## 2015-06-06 DIAGNOSIS — M6281 Muscle weakness (generalized): Secondary | ICD-10-CM | POA: Diagnosis not present

## 2015-06-06 NOTE — Therapy (Signed)
[facility] [address] [place], [place], [address] Phone: [phone]   Fax:  [phone]  Occupational Therapy Treatment  Patient Details  Name: Michelle Holmes MRN: [id number] Date of Birth: [birth year] Referring Provider: Dr. Alysia Penna  Encounter Date: 06/06/2015      OT End of Session - 06/06/15 1428    Visit Number 3   Number of Visits 9   Date for OT Re-Evaluation 06/28/15   Authorization Type UHC Medicare (no visit limit, no auth, G-code needed); medicaid 2nd   Authorization Time Period cert. 05/28/15-07/27/15   Authorization - Visit Number 3   Authorization - Number of Visits 10   OT Start Time 1017   OT Stop Time 1100   OT Time Calculation (min) 43 min   Activity Tolerance Patient tolerated treatment well   Behavior During Therapy WFL for tasks assessed/performed      Past Medical History  Diagnosis Date  . Emphysema   . Colon cancer ([place])   . Hypertension   . Hypercholesteremia   . Diabetes mellitus   . COPD (chronic obstructive pulmonary disease) ([place])   . Coronary artery disease   . Obesity   . Sleep apnea   . TIA (transient ischemic attack)   . CHF (congestive heart failure) ([place])   . Anemia   . DJD (degenerative joint disease)   . Renal insufficiency   . Chronic back pain   . Scoliosis   . Obesity hypoventilation syndrome ([place])   . Stroke ([place])   . Myocardial infarction [facility])     Past Surgical History  Procedure Laterality Date  . Coronary angioplasty with stent placement    . Colon surgery      There were no vitals filed for this visit.      Subjective Assessment - 06/06/15 1358    Subjective  I like this therapy.     Pertinent History CVA 01/13/16, hx of previous CVA with risidual R-sided weakness, hospitalization 05/05/15 with possible TIA or seizure disorder; also hx of HTN, MI, CHF, CAD, DM, COPD, emphysema, sleep apnea, chronic back pain, scoliosis, DJD, renal  insufficiency, anemia, and colon cancer.   Patient Stated Goals get back to "all that I did before"; when questioned pt stated that she wanted to work on toileting transfer ( but inconsistent responses)   Currently in Pain? No/denies   Pain Score 0-No pain                      OT Treatments/Exercises (OP) - 06/06/15 0001    ADLs   Functional Mobility Addressed weight shift needed for squat pivot transfer,and sit to stand transition.  Patient able in standing to begin to accept weight onto actively aligned right leg.  Patient had difficulty sustaining control of right hip/knee extension, but able to consistently recruit with cueing.  Patient with improved postural control (momentary) in standing today.     Neurological Re-education Exercises   Other Exercises 1 Neuromuscular reeducation to address postural control and dynamic sitting baalnce.  Patient resistant to all froward weight shifts, but with facilitation and forward target, able to actively weight shift.  Patient with tendency initially to initiate movement from head, and extend trunk versus flexing forward at hips for squat transfers, or sit to stand transition.  Facilitated forward and right weight shift in sitting and in standing. Patient less resistant to right weight shift today, but natural posture is biased toward left.  Patient with  increased tension in right shoulder, elbow hand noted with weight shifts beyond base of support, yet she is beginning to be able to inhibit this response once she is aware.     Manual Therapy   Edema Management Patient with significant swelling on dorsum of right hand.  Patient came in today wearing pre-fabricated left wrist splint on her right hand. Patient was not clear where / when she received the splint, initially saying this was becasue of her stroke, than later saying she had a problem with her left hand in the past.  Patient may benefit from a right wrist splint to promote better wrist  alignment, and to reduce edema in right wrist/hand.  Provided gentle traction and mobilization to right carpals                OT Education - 06/06/15 1415    Education provided Yes   Education Details Wheelchair positioning and safety.  Patient arrived today in wheelchair that was ill fitting and broken.  Patient offered temporary facility wheelchair to allow her to transport home safely - wider chair with working breaks, cushion, and elevating removable leg rests.  Patient indicates she has not received a wheelchair for home use, and has been using deceased nephew's chair.  Local medical equipment supplier indicates a wheelchair has been delivered.  Patient  uncertain  of status of standard wheelchair.        Person(s) Educated Patient   Methods Explanation   Comprehension Need further instruction;Verbal cues required             OT Long Term Goals - 05/31/15 1419    OT LONG TERM GOAL #1   Title Pt will perform self PROM HEP with mod prompts/cueing.--check goals 06/28/15   Status On-going   OT LONG TERM GOAL #2   Title Pt will participate in functional activities for at least 43min with mod prompts.   Status On-going   OT LONG TERM GOAL #3   Title Pt will tolerate/allow gentle PROM to RUE at least 75%.   Status On-going   OT LONG TERM GOAL #4   Title Pt will perform transfer from w/c to [facility] with mod A.   Status On-going               Plan - 06/06/15 1428    Clinical Impression Statement Patient showing steady improvement with postural control and ability to weight shift for transitional movments.  Patient with significant edema in wrist and hand, and increased active muscle tension throughout right UE - flexor synergy pattern.   Rehab Potential Fair   Clinical Impairments Affecting Rehab Potential limited caregiver support   OT Frequency 2x / week   OT Duration 4 weeks   OT Treatment/Interventions Self-care/ADL training;Therapeutic exercise;Functional Mobility  Training;Patient/family education;Balance training;Splinting;Manual Therapy;Neuromuscular education;Ultrasound;Energy conservation;Manual lymph drainage;Therapeutic exercises;Therapeutic activities;DME and/or AE instruction;Parrafin;Cryotherapy;Electrical Stimulation;Fluidtherapy;Cognitive remediation/compensation;Visual/perceptual remediation/compensation;Passive range of motion;Contrast Bath;Moist Heat   Plan Transfer to/from bedside commode, postural control, NMR RUE   Consulted and Agree with Plan of Care Patient      Patient will benefit from skilled therapeutic intervention in order to improve the following deficits and impairments:  Decreased activity tolerance, Decreased balance, Decreased cognition, Decreased coordination, Decreased endurance, Decreased range of motion, Decreased mobility, Decreased knowledge of use of DME, Decreased knowledge of precautions, Decreased strength, Difficulty walking, Increased edema, Impaired perceived functional ability, Impaired flexibility, Obesity, Impaired UE functional use, Impaired tone, Pain, Impaired vision/preception, Improper body mechanics  Visit Diagnosis: Other symptoms and  signs involving cognitive functions following cerebral infarction  Other lack of coordination  Visuospatial deficit  Pain In Right Arm  Spastic hemiplegia affecting right dominant side (HCC)  Other disturbances of skin sensation    Problem List Patient Active Problem List   Diagnosis Date Noted  . Absolute anemia   . Thrombocytopenia ([place])   . Acute ischemic VBA thalamic stroke ([place]) 01/18/2015  . AKI (acute kidney injury) ([place])   . Dysarthria   . Lethargy   . DM type 2 with diabetic peripheral neuropathy ([place])   . Labile blood pressure   . Hyperlipidemia   . History of CVA with residual deficit   . Chronic obstructive pulmonary disease ([place])   . Hemiparesis, aphasia, and dysphagia as late effect of cerebrovascular accident (CVA) ([place])   . Acute ischemic  stroke ([place])   . CVA (cerebral vascular accident) ([place]) 01/14/2015  . CVA (cerebral infarction) 01/14/2015  . Right hemiplegia ([place])   . Right sided weakness 01/13/2015  . Abdominal pain 06/20/2014  . Altered mental status 02/08/2012  . Sleep apnea 12/08/2010  . Morbid obesity ([place]) 12/08/2010  . CHF (congestive heart failure) ([place]) 12/08/2010  . Chronic renal failure 12/08/2010  . TIA (transient ischemic attack) 12/08/2010  . CAD (coronary artery disease) 12/08/2010  . Cellulitis of pubic region 12/01/2010  . Uncontrolled type 2 DM with hyperosmolar nonketotic hyperglycemia ([place]) 12/01/2010    Mariah Milling, OTR/L 06/06/2015, 2:33 PM  [facility] [address] [place], [place], [address] Phone: [phone]   Fax:  [phone]  Name: AUNDRIA JOCHIMSEN MRN: [id number] Date of Birth: Apr 13, 1950

## 2015-06-06 NOTE — Therapy (Signed)
[facility] [address] [place], [place], [address] Phone: [phone]   Fax:  [phone]  Physical Therapy Treatment  Patient Details  Name: Michelle Holmes MRN: [id number] Date of Birth: 12-20-1950 Referring Provider: Alysia Penna, MD  Encounter Date: 06/05/2015      PT End of Session - 06/05/15 1426    Visit Number 7   Number of Visits 17   Date for PT Re-Evaluation 06/30/15   Authorization Type UHC MCR - G Codes and PN required every 10 visits   PT Start Time 0354  pt late for appt.   PT Stop Time 1454   PT Time Calculation (min) 38 min   Equipment Utilized During Treatment Gait belt   Activity Tolerance Patient tolerated treatment well      Past Medical History  Diagnosis Date  . Emphysema   . Colon cancer ([place])   . Hypertension   . Hypercholesteremia   . Diabetes mellitus   . COPD (chronic obstructive pulmonary disease) ([place])   . Coronary artery disease   . Obesity   . Sleep apnea   . TIA (transient ischemic attack)   . CHF (congestive heart failure) ([place])   . Anemia   . DJD (degenerative joint disease)   . Renal insufficiency   . Chronic back pain   . Scoliosis   . Obesity hypoventilation syndrome ([place])   . Stroke ([place])   . Myocardial infarction [facility])     Past Surgical History  Procedure Laterality Date  . Coronary angioplasty with stent placement    . Colon surgery      Filed Vitals:   06/05/15 1422  BP: 161/86  Pulse: 66        Subjective Assessment - 06/05/15 1422    Subjective Pt late for appt today. Reports she did have a fall at home where she slide out of the wheelchair and family assisted her back up. Denies any injuries. (of note, pt recieved in lobby in "reclined" in wheelchair with her buttocks all the way at the edge. Pt was repositioned in wheelchair by PTA before being brought back to gym with education to family member on proper positioning to prevent pt. from  sliding out of chair).  Treatment further delayed by pt answering phone and eating a sandwhich in gym after being brought back.                                        Pertinent History PMH significant for: CVA x2 (most recent: 01/13/15), HTN, HLD, CHF, COPD, OSA, CAD, DM II, colon cancer, DJD, scoliosis, chronic renal insufficiency   Patient Stated Goals "I want a new wheelchair and I want to walk."   Currently in Pain? No/denies   Pain Score 0-No pain             [facility] Adult PT Treatment/Exercise - 06/05/15 1436    Transfers   Transfers Sit to Stand;Stand to [employer] Transfers   Sit to Stand 3: Mod assist;2: Max assist   Sit to Stand Details Verbal cues for sequencing;Verbal cues for technique;Verbal cues for precautions/safety;Manual facilitation for weight bearing;Manual facilitation for weight shifting;Manual facilitation for placement   Sit to Stand Details (indicate cue type and reason) pt varied in assist needed as reps progressed. cues on bil foot position, weight shifting and hand position each time   Stand to  Sit 4: Min assist;3: Mod assist   Stand to Sit Details (indicate cue type and reason) Verbal cues for technique;Verbal cues for sequencing   Stand to Sit Details cues to reach back to use arm to control descent with sitting down   Stand Pivot Transfers 3: Mod assist  wheelchair <> mat table   Stand Pivot Transfer Details (indicate cue type and reason) wheelchair<>mat table with cues on technique, posture and to advance left foot  with transfer, right knee blocked to prevent buckling             PT Short Term Goals - 06/03/15 1233    PT SHORT TERM GOAL #1   Title Pt will demonstrate consistent carryover of logroll technique for supine <> sit for R-side WB, increased efficiency of movement, and increased independence with bed mobility.   (Target date: 05/29/15)   Baseline 5/2: REVISED goal from formal HEP to between-session carryover of logroll technique for bed  mobility due to cognitive impairments.   Status On-going   PT SHORT TERM GOAL #2   Title Pt will perform supine <> sit with supervision, min cueing to indicate increased independence getting into/out of bed.   Baseline 5/2: Required mod A and max cueing for supine <> sit   Status Not Met   PT SHORT TERM GOAL #3   Title Pt will perform level transfers from w/c <> mat table in B directions with min A, min cueing to indicate increased independence with transfers.   Baseline 5/2: Max A to R side; Min A to L side   Status Partially Met   PT SHORT TERM GOAL #4   Title Pt will perform sit <> stand from w/c with min A, min cueing with LRAD to indicate increased pt independence with functional transfers.   Baseline 5/2: REVISED to add LRAD   Status Revised   PT SHORT TERM GOAL #5   Title Pt will perform dynamic standing task x2 consecutive minutes with single UE support and min to indicate improved postural awareness/control with functional standing.   Status On-going   PT SHORT TERM GOAL #6   Title ATP will assess current w/c to assist in determining if pt appropriate candidate for custom w/c to improve positioning, preserve skin integrity, and increase pt independence with mobility.    Baseline Met 4/25.   Status Achieved   PT SHORT TERM GOAL #7   Title Pt will improve PASS score from 12/36 to 15/36 to indicate improved independence iwth funcitonal mobility.   Status Not Met           PT Long Term Goals - 05/01/15 1501    PT LONG TERM GOAL #1   Title Pt will consistently perform supine <> sit with mod I, no cueing to indicate increased independence getting into/out of bed.  (Target date: 06/26/15)   PT LONG TERM GOAL #2   Title Pt will perform level transfers from w/c <> mat table in B directions with supervision to indicate increased independence with transfers.   PT LONG TERM GOAL #3   Title Pt will perform unlevel transfers in B directions with min A, min cueing for increased  safety/independence with functional transfers.   PT LONG TERM GOAL #4   Title Pt will perform sit <> stand from w/c with supervision to indicate increased pt independence with functional transfers.   PT LONG TERM GOAL #5   Title Pt will improve PASS score from 12/36 to 18/36 to indicate significantly improved  independence with functional mobility.   Additional Long Term Goals   Additional Long Term Goals Yes   PT LONG TERM GOAL #6   Title Will attempt ambulation and set goal, if appropriate, to assess if safe/functional to address short-distance household ambulation.           Plan - 06/05/15 1426    Clinical Impression Statement Pt needed maximal encouragement to participated with therapies today with increased assistance needed for tranfers and decreased standing tolerance noted. Discussed wheelchair positioning and safety with pt's caregiver that transports her as pt has fallen/slid out of her wheelchair both here and at home. Demo'd proper positioning in wheelchair by repositioning pt while he was present. Discussed this wth primary PT who in turn discussed this with the wheelchair vendor who stated we can add a seatbelt to her current wheelchair while waiting on approval for her power wheelchair. The plan is for this to occur at her Tuesday 06/11/15 appt.                                                                      Rehab Potential Fair   Clinical Impairments Affecting Rehab Potential cognitive impairments; limited family support; relies on friend for transportation   PT Frequency 2x / week   PT Duration 8 weeks   PT Treatment/Interventions ADLs/Self Care Home Management;DME Instruction;Electrical Stimulation;Vestibular;Gait training;Neuromuscular re-education;Stair training;Functional mobility training;Therapeutic activities;Patient/family education;Therapeutic exercise;Balance training;Prosthetic Training;Orthotic Fit/Training;Wheelchair mobility training   PT Next Visit Plan  continue to work on transfers and standing, strengthening, all towards LTGs.   Consulted and Agree with Plan of Care Patient      Patient will benefit from skilled therapeutic intervention in order to improve the following deficits and impairments:  Postural dysfunction, Impaired tone, Abnormal gait, Decreased balance, Decreased activity tolerance, Decreased endurance, Decreased coordination, Decreased mobility, Impaired perceived functional ability, Impaired sensation, Decreased knowledge of use of DME, Decreased strength  Visit Diagnosis: Muscle weakness (generalized)  Other abnormalities of gait and mobility  Abnormal posture  Spastic hemiplegia affecting right dominant side [facility])     Problem List Patient Active Problem List   Diagnosis Date Noted  . Absolute anemia   . Thrombocytopenia ([place])   . Acute ischemic VBA thalamic stroke ([place]) 01/18/2015  . AKI (acute kidney injury) ([place])   . Dysarthria   . Lethargy   . DM type 2 with diabetic peripheral neuropathy ([place])   . Labile blood pressure   . Hyperlipidemia   . History of CVA with residual deficit   . Chronic obstructive pulmonary disease ([place])   . Hemiparesis, aphasia, and dysphagia as late effect of cerebrovascular accident (CVA) ([place])   . Acute ischemic stroke ([place])   . CVA (cerebral vascular accident) ([place]) 01/14/2015  . CVA (cerebral infarction) 01/14/2015  . Right hemiplegia ([place])   . Right sided weakness 01/13/2015  . Abdominal pain 06/20/2014  . Altered mental status 02/08/2012  . Sleep apnea 12/08/2010  . Morbid obesity ([place]) 12/08/2010  . CHF (congestive heart failure) ([place]) 12/08/2010  . Chronic renal failure 12/08/2010  . TIA (transient ischemic attack) 12/08/2010  . CAD (coronary artery disease) 12/08/2010  . Cellulitis of pubic region 12/01/2010  . Uncontrolled type 2 DM with hyperosmolar nonketotic hyperglycemia ([place]) 12/01/2010  Willow Ora, PTA, [facility] [address], [place], [place] [address] [phone] 06/06/2015, 8:20 AM   Name: Michelle Holmes MRN: [id number] Date of Birth: 1950-03-05

## 2015-06-07 ENCOUNTER — Ambulatory Visit: Payer: Medicare Other | Admitting: Physical Therapy

## 2015-06-07 ENCOUNTER — Ambulatory Visit: Payer: Medicare Other | Admitting: Occupational Therapy

## 2015-06-07 ENCOUNTER — Encounter: Payer: Self-pay | Admitting: Physical Therapy

## 2015-06-07 ENCOUNTER — Encounter: Payer: Self-pay | Admitting: Occupational Therapy

## 2015-06-07 DIAGNOSIS — M79601 Pain in right arm: Secondary | ICD-10-CM

## 2015-06-07 DIAGNOSIS — R208 Other disturbances of skin sensation: Secondary | ICD-10-CM | POA: Diagnosis not present

## 2015-06-07 DIAGNOSIS — I69318 Other symptoms and signs involving cognitive functions following cerebral infarction: Secondary | ICD-10-CM | POA: Diagnosis not present

## 2015-06-07 DIAGNOSIS — R278 Other lack of coordination: Secondary | ICD-10-CM

## 2015-06-07 DIAGNOSIS — R41842 Visuospatial deficit: Secondary | ICD-10-CM

## 2015-06-07 DIAGNOSIS — G8111 Spastic hemiplegia affecting right dominant side: Secondary | ICD-10-CM

## 2015-06-07 DIAGNOSIS — R293 Abnormal posture: Secondary | ICD-10-CM | POA: Diagnosis not present

## 2015-06-07 DIAGNOSIS — R2689 Other abnormalities of gait and mobility: Secondary | ICD-10-CM

## 2015-06-07 DIAGNOSIS — M6281 Muscle weakness (generalized): Secondary | ICD-10-CM | POA: Diagnosis not present

## 2015-06-07 NOTE — Therapy (Signed)
[facility] [address] [place], [place], [address] Phone: [phone]   Fax:  [phone]  Occupational Therapy Treatment  Patient Details  Name: Michelle Holmes MRN: [id number] Date of Birth: [birth year] Referring Provider: Dr. Alysia Penna  Encounter Date: 06/07/2015      OT End of Session - 06/07/15 1341    Visit Number 4   Number of Visits 9   Date for OT Re-Evaluation 06/28/15   Authorization Type UHC Medicare (no visit limit, no auth, G-code needed); medicaid 2nd   Authorization Time Period cert. 05/28/15-07/27/15   Authorization - Visit Number 4   Authorization - Number of Visits 10   OT Start Time [id number]   OT Stop Time 1100   OT Time Calculation (min) 45 min   Activity Tolerance Patient tolerated treatment well   Behavior During Therapy WFL for tasks assessed/performed      Past Medical History  Diagnosis Date  . Emphysema   . Colon cancer ([place])   . Hypertension   . Hypercholesteremia   . Diabetes mellitus   . COPD (chronic obstructive pulmonary disease) ([place])   . Coronary artery disease   . Obesity   . Sleep apnea   . TIA (transient ischemic attack)   . CHF (congestive heart failure) ([place])   . Anemia   . DJD (degenerative joint disease)   . Renal insufficiency   . Chronic back pain   . Scoliosis   . Obesity hypoventilation syndrome ([place])   . Stroke ([place])   . Myocardial infarction [facility])     Past Surgical History  Procedure Laterality Date  . Coronary angioplasty with stent placement    . Colon surgery      There were no vitals filed for this visit.      Subjective Assessment - 06/07/15 1329    Subjective  My hand is not so swollen   Pertinent History CVA 01/13/16, hx of previous CVA with risidual R-sided weakness, hospitalization 05/05/15 with possible TIA or seizure disorder; also hx of HTN, MI, CHF, CAD, DM, COPD, emphysema, sleep apnea, chronic back pain, scoliosis, DJD, renal  insufficiency, anemia, and colon cancer.   Patient Stated Goals get back to "all that I did before"; when questioned pt stated that she wanted to work on toileting transfer ( but inconsistent responses)   Currently in Pain? No/denies   Pain Score 0-No pain                      OT Treatments/Exercises (OP) - 06/07/15 1330    ADLs   Functional Mobility Patient has identified a toilet transfer goal.  Working to improve her ability to weight shift forward for transition to squat or stand position.  With repetition, patient is showing greater ease with these transitions.  Also facilitated weight shifts to and froim right side.  Patient with tendency to pull or drift back to left in sitting or standing.  Facilitated active right leg in standing for functional (dynamic) standing.     Neurological Re-education Exercises   Other Exercises 1 Patient required mod assist to transition from sit to supine.  Patient very fearful of falling off edge of mat, so at times resistant to this transition.  Patient while in supine able to isolate elbow flexion and extension with min but constant facilitation.  Patient having difficulty with arousal in this position, so returned to seated position.  Facilitated upright postural control in sitting to improve scapular  stability and mobility.  Patient with significant reduction in muscle tension and resultant improved range of motion passisvely immediately following dynamic stretching in sitting.                   OT Education - 06/07/15 1340    Education provided Yes   Education Details Positioning of right arm to reduce swelling   Person(s) Educated Patient   Methods Explanation;Demonstration   Comprehension Need further instruction;Tactile cues required;Verbal cues required             OT Long Term Goals - 05/31/15 1419    OT LONG TERM GOAL #1   Title Pt will perform self PROM HEP with mod prompts/cueing.--check goals 06/28/15   Status  On-going   OT LONG TERM GOAL #2   Title Pt will participate in functional activities for at least 58min with mod prompts.   Status On-going   OT LONG TERM GOAL #3   Title Pt will tolerate/allow gentle PROM to RUE at least 75%.   Status On-going   OT LONG TERM GOAL #4   Title Pt will perform transfer from w/c to [facility] with mod A.   Status On-going               Plan - 06/07/15 1341    Clinical Impression Statement Patient showing steady improvement with participation in rehab process, with postural control, and with functional mobility.     Rehab Potential Fair   Clinical Impairments Affecting Rehab Potential limited caregiver support   OT Frequency 2x / week   OT Duration 4 weeks   OT Treatment/Interventions Self-care/ADL training;Therapeutic exercise;Functional Mobility Training;Patient/family education;Balance training;Splinting;Manual Therapy;Neuromuscular education;Ultrasound;Energy conservation;Manual lymph drainage;Therapeutic exercises;Therapeutic activities;DME and/or AE instruction;Parrafin;Cryotherapy;Electrical Stimulation;Fluidtherapy;Cognitive remediation/compensation;Visual/perceptual remediation/compensation;Passive range of motion;Contrast Bath;Moist Heat   Plan Transfer to/from bedside commode (squat pivot versus stand step), postural control, NMR RUE   Consulted and Agree with Plan of Care Patient      Patient will benefit from skilled therapeutic intervention in order to improve the following deficits and impairments:  Decreased activity tolerance, Decreased balance, Decreased cognition, Decreased coordination, Decreased endurance, Decreased range of motion, Decreased mobility, Decreased knowledge of use of DME, Decreased knowledge of precautions, Decreased strength, Difficulty walking, Increased edema, Impaired perceived functional ability, Impaired flexibility, Obesity, Impaired UE functional use, Impaired tone, Pain, Impaired vision/preception, Improper body  mechanics  Visit Diagnosis: Other lack of coordination  Visuospatial deficit  Pain In Right Arm  Spastic hemiplegia affecting right dominant side (HCC)  Other symptoms and signs involving cognitive functions following cerebral infarction  Other disturbances of skin sensation    Problem List Patient Active Problem List   Diagnosis Date Noted  . Absolute anemia   . Thrombocytopenia ([place])   . Acute ischemic VBA thalamic stroke ([place]) 01/18/2015  . AKI (acute kidney injury) ([place])   . Dysarthria   . Lethargy   . DM type 2 with diabetic peripheral neuropathy ([place])   . Labile blood pressure   . Hyperlipidemia   . History of CVA with residual deficit   . Chronic obstructive pulmonary disease ([place])   . Hemiparesis, aphasia, and dysphagia as late effect of cerebrovascular accident (CVA) ([place])   . Acute ischemic stroke ([place])   . CVA (cerebral vascular accident) ([place]) 01/14/2015  . CVA (cerebral infarction) 01/14/2015  . Right hemiplegia ([place])   . Right sided weakness 01/13/2015  . Abdominal pain 06/20/2014  . Altered mental status 02/08/2012  . Sleep apnea 12/08/2010  . Morbid obesity (  [place]) 12/08/2010  . CHF (congestive heart failure) ([place]) 12/08/2010  . Chronic renal failure 12/08/2010  . TIA (transient ischemic attack) 12/08/2010  . CAD (coronary artery disease) 12/08/2010  . Cellulitis of pubic region 12/01/2010  . Uncontrolled type 2 DM with hyperosmolar nonketotic hyperglycemia ([place]) 12/01/2010    Mariah Milling, OTR/L 06/07/2015, 1:44 PM  [facility] [address] [place], [place], [address] Phone: [phone]   Fax:  [phone]  Name: Michelle Holmes MRN: [id number] Date of Birth: [birth year]

## 2015-06-10 NOTE — Therapy (Signed)
[facility] [address] [place], [place], [address] Phone: [phone]   Fax:  [phone]  Physical Therapy Treatment  Patient Details  Name: Michelle Holmes MRN: [id number] Date of Birth: July 20, 1950 Referring Provider: Alysia Penna, MD  Encounter Date: 06/07/2015   06/07/15 0937  PT Visits / Re-Eval  Visit Number 8  Number of Visits 17  Date for PT Re-Evaluation 06/30/15  Authorization  Authorization Type UHC MCR - G Codes and PN required every 10 visits  PT Time Calculation  PT Start Time 0931  PT Stop Time 1015  PT Time Calculation (min) 44 min  PT - End of Session  Equipment Utilized During Treatment Gait belt  Activity Tolerance Patient tolerated treatment well     Past Medical History  Diagnosis Date  . Emphysema   . Colon cancer ([place])   . Hypertension   . Hypercholesteremia   . Diabetes mellitus   . COPD (chronic obstructive pulmonary disease) ([place])   . Coronary artery disease   . Obesity   . Sleep apnea   . TIA (transient ischemic attack)   . CHF (congestive heart failure) ([place])   . Anemia   . DJD (degenerative joint disease)   . Renal insufficiency   . Chronic back pain   . Scoliosis   . Obesity hypoventilation syndrome ([place])   . Stroke ([place])   . Myocardial infarction [facility])     Past Surgical History  Procedure Laterality Date  . Coronary angioplasty with stent placement    . Colon surgery      There were no vitals filed for this visit.     06/07/15 0935  Symptoms/Limitations  Subjective No new complaiints. Pt is very pleased with the loaner chair until her chair arrives (still slouched in chair on arrival, however not as close to edge as with her's due to leg rest support). Pt also expressed happiness over right hand function since yesterday's OT session. No falls to report.  Pertinent History PMH significant for: CVA x2 (most recent: 01/13/15), HTN, HLD, CHF, COPD, OSA, CAD, DM  II, colon cancer, DJD, scoliosis, chronic renal insufficiency  Patient Stated Goals "I want a new wheelchair and I want to walk."  Pain Assessment  Currently in Pain? No/denies  Pain Score 0      06/07/15 0946  Bed Mobility  Rolling Right 4: Min guard;5: Set up (x 3 reps)  Rolling Right Details (indicate cue type and reason) cues on sequencing and technique  Rolling Left 4: Min assist;5: Set up (min to bring right shoulder over, performed x 3 reps)  Rolling Left Details (indicate cue type and reason) cues on sequencing and technique  Right Sidelying to Sit 4: Min assist;5: Set up  Right Sidelying to Sit Details (indicate cue type and reason) min assist to bring trunk up into sitting at edge of mat  Transfers  Transfers Sit to Stand;Stand to Sit;Stand Pivot Transfers  Sit to Stand 3: Mod assist  Sit to Stand Details Verbal cues for sequencing;Verbal cues for technique;Verbal cues for precautions/safety;Manual facilitation for weight bearing;Manual facilitation for weight shifting;Manual facilitation for placement  Sit to Stand Details (indicate cue type and reason) decreased assistance needed as reps progressed  Stand to Sit 4: Min assist;3: Mod assist  Stand to Sit Details (indicate cue type and reason) Verbal cues for technique;Verbal cues for sequencing  Stand to Sit Details decreased assistance needed as reps progressed  Stand Pivot Transfers 3: Mod assist  Stand Pivot Transfer Details (indicate cue type and reason) wheelchair<>mat table with cues on posture, to advance left foot and on technique.   Comments standing x 3 reps with UE support and right knee blocked to prevent buckling: lateral weight shifting performed with mod cues/facilitaion for upright and midline posture each time. Stood x 1 rep with 2 person assist, facilitaiton for upright /midline posture/position, with left knee blocked, right stepping forward/backward. Lots of cues and encouragement needed.                      .  Ambulation/Gait  Pre-Gait Activities standing with right knee blocked: left stepping forward/backward: see above for details           PT Short Term Goals - 06/03/15 1233    PT SHORT TERM GOAL #1   Title Pt will demonstrate consistent carryover of logroll technique for supine <> sit for R-side WB, increased efficiency of movement, and increased independence with bed mobility.   (Target date: 05/29/15)   Baseline 5/2: REVISED goal from formal HEP to between-session carryover of logroll technique for bed mobility due to cognitive impairments.   Status On-going   PT SHORT TERM GOAL #2   Title Pt will perform supine <> sit with supervision, min cueing to indicate increased independence getting into/out of bed.   Baseline 5/2: Required mod A and max cueing for supine <> sit   Status Not Met   PT SHORT TERM GOAL #3   Title Pt will perform level transfers from w/c <> mat table in B directions with min A, min cueing to indicate increased independence with transfers.   Baseline 5/2: Max A to R side; Min A to L side   Status Partially Met   PT SHORT TERM GOAL #4   Title Pt will perform sit <> stand from w/c with min A, min cueing with LRAD to indicate increased pt independence with functional transfers.   Baseline 5/2: REVISED to add LRAD   Status Revised   PT SHORT TERM GOAL #5   Title Pt will perform dynamic standing task x2 consecutive minutes with single UE support and min to indicate improved postural awareness/control with functional standing.   Status On-going   PT SHORT TERM GOAL #6   Title ATP will assess current w/c to assist in determining if pt appropriate candidate for custom w/c to improve positioning, preserve skin integrity, and increase pt independence with mobility.    Baseline Met 4/25.   Status Achieved   PT SHORT TERM GOAL #7   Title Pt will improve PASS score from 12/36 to 15/36 to indicate improved independence iwth funcitonal mobility.   Status Not Met            PT Long Term Goals - 05/01/15 1501    PT LONG TERM GOAL #1   Title Pt will consistently perform supine <> sit with mod I, no cueing to indicate increased independence getting into/out of bed.  (Target date: 06/26/15)   PT LONG TERM GOAL #2   Title Pt will perform level transfers from w/c <> mat table in B directions with supervision to indicate increased independence with transfers.   PT LONG TERM GOAL #3   Title Pt will perform unlevel transfers in B directions with min A, min cueing for increased safety/independence with functional transfers.   PT LONG TERM GOAL #4   Title Pt will perform sit <> stand from w/c with supervision to indicate increased pt  independence with functional transfers.   PT LONG TERM GOAL #5   Title Pt will improve PASS score from 12/36 to 18/36 to indicate significantly improved independence with functional mobility.   Additional Long Term Goals   Additional Long Term Goals Yes   PT LONG TERM GOAL #6   Title Will attempt ambulation and set goal, if appropriate, to assess if safe/functional to address short-distance household ambulation.        06/07/15 1610  Plan  Clinical Impression Statement Pt more paticipatory today and performed increased activity with less physical assistance needed. Continues to need cues for sequencing/technique with most mobility aspects. Pt is making slow, steady progress toward goals .  Pt will benefit from skilled therapeutic intervention in order to improve on the following deficits Postural dysfunction;Impaired tone;Abnormal gait;Decreased balance;Decreased activity tolerance;Decreased endurance;Decreased coordination;Decreased mobility;Impaired perceived functional ability;Impaired sensation;Decreased knowledge of use of DME;Decreased strength  Rehab Potential Fair  Clinical Impairments Affecting Rehab Potential cognitive impairments; limited family support; relies on friend for transportation  PT Frequency 2x / week  PT Duration 8  weeks  PT Treatment/Interventions ADLs/Self Care Home Management;DME Instruction;Electrical Stimulation;Vestibular;Gait training;Neuromuscular re-education;Stair training;Functional mobility training;Therapeutic activities;Patient/family education;Therapeutic exercise;Balance training;Prosthetic Training;Orthotic Fit/Training;Wheelchair mobility training  PT Next Visit Plan continue to work on transfers and standing, strengthening, all towards LTGs.  Consulted and Agree with Plan of Care Patient      Patient will benefit from skilled therapeutic intervention in order to improve the following deficits and impairments:  Postural dysfunction, Impaired tone, Abnormal gait, Decreased balance, Decreased activity tolerance, Decreased endurance, Decreased coordination, Decreased mobility, Impaired perceived functional ability, Impaired sensation, Decreased knowledge of use of DME, Decreased strength  Visit Diagnosis: Muscle weakness (generalized)  Other abnormalities of gait and mobility  Other disturbances of skin sensation  Spastic hemiplegia affecting right dominant side [facility])     Problem List Patient Active Problem List   Diagnosis Date Noted  . Absolute anemia   . Thrombocytopenia ([place])   . Acute ischemic VBA thalamic stroke ([place]) 01/18/2015  . AKI (acute kidney injury) ([place])   . Dysarthria   . Lethargy   . DM type 2 with diabetic peripheral neuropathy ([place])   . Labile blood pressure   . Hyperlipidemia   . History of CVA with residual deficit   . Chronic obstructive pulmonary disease ([place])   . 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 Acute ischemic stroke ([place])   . CVA (cerebral vascular accident) ([place]) 01/14/2015  . CVA (cerebral infarction) 01/14/2015  . Right hemiplegia ([place])   . Right sided weakness 01/13/2015  . Abdominal pain 06/20/2014  . Altered mental status 02/08/2012  . Sleep apnea 12/08/2010  . Morbid obesity  ([place]) 12/08/2010  . CHF (congestive heart failure) ([place]) 12/08/2010  . Chronic renal failure 12/08/2010  . TIA (transient ischemic attack) 12/08/2010  . CAD (coronary artery disease) 12/08/2010  . Cellulitis of pubic region 12/01/2010  . Uncontrolled type 2 DM with hyperosmolar nonketotic hyperglycemia ([place]) 12/01/2010    Willow Ora, PTA, [facility] [address] [place], [place] [address] [phone] 06/10/2015, 9:58 AM   Name: JAQLYN GRUENHAGEN MRN: [id number] Date of Birth: 10-10-1950

## 2015-06-11 ENCOUNTER — Ambulatory Visit: Payer: Medicare Other | Admitting: Physical Therapy

## 2015-06-11 ENCOUNTER — Ambulatory Visit: Payer: Medicare Other | Admitting: Occupational Therapy

## 2015-06-11 DIAGNOSIS — G8111 Spastic hemiplegia affecting right dominant side: Secondary | ICD-10-CM

## 2015-06-11 DIAGNOSIS — M79601 Pain in right arm: Secondary | ICD-10-CM

## 2015-06-11 DIAGNOSIS — R278 Other lack of coordination: Secondary | ICD-10-CM

## 2015-06-11 DIAGNOSIS — R41842 Visuospatial deficit: Secondary | ICD-10-CM

## 2015-06-11 DIAGNOSIS — I69318 Other symptoms and signs involving cognitive functions following cerebral infarction: Secondary | ICD-10-CM | POA: Diagnosis not present

## 2015-06-11 DIAGNOSIS — R208 Other disturbances of skin sensation: Secondary | ICD-10-CM | POA: Diagnosis not present

## 2015-06-11 DIAGNOSIS — R293 Abnormal posture: Secondary | ICD-10-CM | POA: Diagnosis not present

## 2015-06-11 DIAGNOSIS — R2689 Other abnormalities of gait and mobility: Secondary | ICD-10-CM | POA: Diagnosis not present

## 2015-06-11 DIAGNOSIS — M6281 Muscle weakness (generalized): Secondary | ICD-10-CM | POA: Diagnosis not present

## 2015-06-11 NOTE — Therapy (Signed)
[facility] [address] [place], [place], [address] Phone: [phone]   Fax:  [phone]  Occupational Therapy Treatment  Patient Details  Name: Michelle Holmes MRN: [id number] Date of Birth: November 20, 1950 Referring Provider: Dr. Alysia Penna  Encounter Date: 06/11/2015      OT End of Session - 06/11/15 1223    Visit Number 5   Number of Visits 9   Date for OT Re-Evaluation 06/28/15   Authorization Type UHC Medicare (no visit limit, no auth, G-code needed); medicaid 2nd   Authorization Time Period cert. 05/28/15-07/27/15   Authorization - Visit Number 5   Authorization - Number of Visits 10   OT Start Time [phone]  pt arrived late   OT Stop Time 0931   OT Time Calculation (min) 24 min   Activity Tolerance Patient tolerated treatment well   Behavior During Therapy [facility] for tasks assessed/performed      Past Medical History  Diagnosis Date  . Emphysema   . Colon cancer ([place])   . Hypertension   . Hypercholesteremia   . Diabetes mellitus   . COPD (chronic obstructive pulmonary disease) ([place])   . Coronary artery disease   . Obesity   . Sleep apnea   . TIA (transient ischemic attack)   . CHF (congestive heart failure) ([place])   . Anemia   . DJD (degenerative joint disease)   . Renal insufficiency   . Chronic back pain   . Scoliosis   . Obesity hypoventilation syndrome ([place])   . Stroke ([place])   . Myocardial infarction [facility])     Past Surgical History  Procedure Laterality Date  . Coronary angioplasty with stent placement    . Colon surgery      There were no vitals filed for this visit.      Subjective Assessment - 06/11/15 1216    Subjective  "I'm doing better"  Pt reports that she has received a loaner w/c until she can get another one   Patient is accompained by: --  daughter dropped pt off, but reports that she cannot stay   Pertinent History CVA 01/13/16, hx of previous CVA with risidual R-sided  weakness, hospitalization 05/05/15 with possible TIA or seizure disorder; also hx of HTN, MI, CHF, CAD, DM, COPD, emphysema, sleep apnea, chronic back pain, scoliosis, DJD, renal insufficiency, anemia, and colon cancer.   Limitations cognitive deficits, inconsistent participation   Patient Stated Goals get back to "all that I did before"; when questioned pt stated that she wanted to work on toileting transfer ( but inconsistent responses)   Currently in Pain? No/denies                      OT Treatments/Exercises (OP) - 06/11/15 1225    Neuro re-ed   Functional Mobility Pt demo good participation and attention to R side with cueing to help position LEs in prep for transfer (with min A), and participated in lifting RUE to assist with gait belt donning.  Pt transfered towards nonaffected side with min A and cueing.  Pt albe to shift wt. to scoot back on mat with min cueing.    Neurological Re-education Exercises   Other Exercises 1 AAROM shoulder horizontal abduction/ER and shoulder flex/elbow ext with min cueing and positive reinforcement/praise.    Seated with weight on hand Gentle/light wt. bearing through RUE (hand) on therapist's knee with body on arm movements and wt. shift to the R for incr postural  control and neuro re-ed RUE with min facilitation/cues.   Other Weight-Bearing Exercises 1 wt. bearing through R elbow in sitting with push through RUE to siting for incr wt. shift and trunk control.  Pt performed with min-mod faciliation/cues.   Manual Therapy   Manual Therapy Passive ROM   Edema Management Edema massage to R dorsal hand to reduce edema   Passive ROM Gentle PROM L wrist/hand for extension and flexion within pt tolerance.                OT Education - 06/11/15 1219    Education Details Reviewed positioning of R arm to decr edema   Person(s) Educated Patient   Methods Explanation;Demonstration   Comprehension Verbalized understanding;Returned  demonstration;Need further instruction;Verbal cues required;Tactile cues required             OT Long Term Goals - 06/11/15 1222    OT LONG TERM GOAL #1   Title Pt will perform self PROM HEP with mod prompts/cueing.--check goals 06/28/15   Status On-going   OT LONG TERM GOAL #2   Title Pt will participate in functional activities for at least 30min with mod prompts.   Status On-going   OT LONG TERM GOAL #3   Title Pt will tolerate/allow gentle PROM to RUE at least 75% of the time.   Status On-going   OT LONG TERM GOAL #4   Title Pt will perform transfer from w/c to [facility] with mod A.   Status On-going               Plan - 06/11/15 1219    Clinical Impression Statement Pt arrived 20 min late today.  However, pt demo good participation today and demo improvements with functional mobility, RUE AAROM, attention to R side, and alertness.     Rehab Potential Fair   Clinical Impairments Affecting Rehab Potential no caregiver support (during rehab affecting carryover)   OT Frequency 2x / week   OT Duration 4 weeks   OT Treatment/Interventions Self-care/ADL training;Therapeutic exercise;Functional Mobility Training;Patient/family education;Balance training;Splinting;Manual Therapy;Neuromuscular education;Ultrasound;Energy conservation;Manual lymph drainage;Therapeutic exercises;Therapeutic activities;DME and/or AE instruction;Parrafin;Cryotherapy;Electrical Stimulation;Fluidtherapy;Cognitive remediation/compensation;Visual/perceptual remediation/compensation;Passive range of motion;Contrast Bath;Moist Heat   Plan transfer to/from beside commode (squat pivot versus stand step), posutral control, NMR RUE   Consulted and Agree with Plan of Care Patient      Patient will benefit from skilled therapeutic intervention in order to improve the following deficits and impairments:  Decreased activity tolerance, Decreased balance, Decreased cognition, Decreased coordination, Decreased endurance,  Decreased range of motion, Decreased mobility, Decreased knowledge of use of DME, Decreased knowledge of precautions, Decreased strength, Difficulty walking, Increased edema, Impaired perceived functional ability, Impaired flexibility, Obesity, Impaired UE functional use, Impaired tone, Pain, Impaired vision/preception, Improper body mechanics  Visit Diagnosis: Spastic hemiplegia affecting right dominant side (HCC)  Other lack of coordination  Visuospatial deficit  Pain In Right Arm  Other abnormalities of gait and mobility  Other symptoms and signs involving cognitive functions following cerebral infarction    Problem List Patient Active Problem List   Diagnosis Date Noted  . Absolute anemia   . Thrombocytopenia ([place])   . Acute ischemic VBA thalamic stroke ([place]) 01/18/2015  . AKI (acute kidney injury) ([place])   . Dysarthria   . Lethargy   . DM type 2 with diabetic peripheral neuropathy ([place])   . Labile blood pressure   . Hyperlipidemia   . History of CVA with residual deficit   . Chronic obstructive pulmonary disease ([place])   .  Hemiparesis, aphasia, and dysphagia as late effect of cerebrovascular accident (CVA) ([place])   . Acute ischemic stroke ([place])   . CVA (cerebral vascular accident) ([place]) 01/14/2015  . CVA (cerebral infarction) 01/14/2015  . Right hemiplegia ([place])   . Right sided weakness 01/13/2015  . Abdominal pain 06/20/2014  . Altered mental status 02/08/2012  . Sleep apnea 12/08/2010  . Morbid obesity ([place]) 12/08/2010  . CHF (congestive heart failure) ([place]) 12/08/2010  . Chronic renal failure 12/08/2010  . TIA (transient ischemic attack) 12/08/2010  . CAD (coronary artery disease) 12/08/2010  . Cellulitis of pubic region 12/01/2010  . Uncontrolled type 2 DM with hyperosmolar nonketotic hyperglycemia ([place]) 12/01/2010    [facility] 06/11/2015, 12:52 PM  [facility] [address] [place],  [place], [address] Phone: [phone]   Fax:  [phone]  Name: Michelle Holmes MRN: [id number] Date of Birth: November 11, 1950  Vianne Bulls, OTR/L [facility] [address] [place], [place]  [address] [phone] phone [phone] 06/11/2015 12:52 PM

## 2015-06-11 NOTE — Therapy (Signed)
[facility] [address] [place], [place], [address] Phone: [phone]   Fax:  [phone]  Physical Therapy Treatment  Patient Details  Name: Michelle Holmes MRN: [id number] Date of Birth: April 25, 1950 Referring Provider: Alysia Penna, MD  Encounter Date: 06/11/2015      PT End of Session - 06/11/15 1030    Visit Number 9   Number of Visits 17   Date for PT Re-Evaluation 06/30/15   Authorization Type UHC MCR - G Codes and PN required every 10 visits   PT Start Time 0932   PT Stop Time 1017   PT Time Calculation (min) 45 min   Equipment Utilized During Treatment Gait belt      Past Medical History  Diagnosis Date  . Emphysema   . Colon cancer ([place])   . Hypertension   . Hypercholesteremia   . Diabetes mellitus   . COPD (chronic obstructive pulmonary disease) ([place])   . Coronary artery disease   . Obesity   . Sleep apnea   . TIA (transient ischemic attack)   . CHF (congestive heart failure) ([place])   . Anemia   . DJD (degenerative joint disease)   . Renal insufficiency   . Chronic back pain   . Scoliosis   . Obesity hypoventilation syndrome ([place])   . Stroke ([place])   . Myocardial infarction [facility])     Past Surgical History  Procedure Laterality Date  . Coronary angioplasty with stent placement    . Colon surgery      There were no vitals filed for this visit.      Subjective Assessment - 06/11/15 1022    Subjective Pt states she is doing good today - really likes the loaner wheelchair   Pertinent History PMH significant for: CVA x2 (most recent: 01/13/15), HTN, HLD, CHF, COPD, OSA, CAD, DM II, colon cancer, DJD, scoliosis, chronic renal insufficiency   Patient Stated Goals "I want a new wheelchair and I want to walk."   Currently in Pain? No/denies                         [facility] Adult PT Treatment/Exercise - 06/11/15 0001    Transfers   Transfers Sit to Stand   Sit to Stand 3: Mod  assist  min assist on final 2 reps (out of 5 reps total)   Sit to Stand Details Verbal cues for sequencing;Tactile cues for placement  tactile/min assist for positioning R foot   Stand to Sit 3: Mod assist;Uncontrolled descent  needs cues to reach back   Ambulation/Gait   Ambulation/Gait Yes   Ambulation/Gait Assistance 3: Mod assist  max assist in blocking R knee to prevent buckling   Ambulation Distance (Feet) 10 Feet  4 reps in bars   Assistive device Parallel bars   Gait Pattern Decreased hip/knee flexion - right;Decreased step length - left;Poor foot clearance - right;Scissoring;Decreased weight shift to right;Decreased dorsiflexion - right   Ambulation Surface Level;Indoor   Knee/Hip Exercises: Aerobic   Stationary Bike SciFit level 1.0 x 4"  from wheelchair      TherEx:  Seated - LAQ's RLE - 3 sec hold 5 reps; attempted seated hip flexion RLE - difficult to perform; pt declined to do  Exercises in supine on mat for RLE strengthening            PT Short Term Goals - 06/03/15 1233    PT SHORT TERM GOAL #1  Title Pt will demonstrate consistent carryover of logroll technique for supine <> sit for R-side WB, increased efficiency of movement, and increased independence with bed mobility.   (Target date: 05/29/15)   Baseline 5/2: REVISED goal from formal HEP to between-session carryover of logroll technique for bed mobility due to cognitive impairments.   Status On-going   PT SHORT TERM GOAL #2   Title Pt will perform supine <> sit with supervision, min cueing to indicate increased independence getting into/out of bed.   Baseline 5/2: Required mod A and max cueing for supine <> sit   Status Not Met   PT SHORT TERM GOAL #3   Title Pt will perform level transfers from w/c <> mat table in B directions with min A, min cueing to indicate increased independence with transfers.   Baseline 5/2: Max A to R side; Min A to L side   Status Partially Met   PT SHORT TERM GOAL #4    Title Pt will perform sit <> stand from w/c with min A, min cueing with LRAD to indicate increased pt independence with functional transfers.   Baseline 5/2: REVISED to add LRAD   Status Revised   PT SHORT TERM GOAL #5   Title Pt will perform dynamic standing task x2 consecutive minutes with single UE support and min to indicate improved postural awareness/control with functional standing.   Status On-going   PT SHORT TERM GOAL #6   Title ATP will assess current w/c to assist in determining if pt appropriate candidate for custom w/c to improve positioning, preserve skin integrity, and increase pt independence with mobility.    Baseline Met 4/25.   Status Achieved   PT SHORT TERM GOAL #7   Title Pt will improve PASS score from 12/36 to 15/36 to indicate improved independence iwth funcitonal mobility.   Status Not Met           PT Long Term Goals - 05/01/15 1501    PT LONG TERM GOAL #1   Title Pt will consistently perform supine <> sit with mod I, no cueing to indicate increased independence getting into/out of bed.  (Target date: 06/26/15)   PT LONG TERM GOAL #2   Title Pt will perform level transfers from w/c <> mat table in B directions with supervision to indicate increased independence with transfers.   PT LONG TERM GOAL #3   Title Pt will perform unlevel transfers in B directions with min A, min cueing for increased safety/independence with functional transfers.   PT LONG TERM GOAL #4   Title Pt will perform sit <> stand from w/c with supervision to indicate increased pt independence with functional transfers.   PT LONG TERM GOAL #5   Title Pt will improve PASS score from 12/36 to 18/36 to indicate significantly improved independence with functional mobility.   Additional Long Term Goals   Additional Long Term Goals Yes   PT LONG TERM GOAL #6   Title Will attempt ambulation and set goal, if appropriate, to assess if safe/functional to address short-distance household ambulation.                Plan - 06/11/15 1031    Clinical Impression Statement Pt declined to perform exercises lying down on mat; pt gait trained inside parallel bars with manual blocking of R knee to prevent buckling with L swing through - pt did well with gait training initiated for first time today; pt does have significant weak R hip flexors resulting  in some difficulty with R swing through   Rehab Potential Fair   Clinical Impairments Affecting Rehab Potential cognitive impairments; limited family support; relies on friend for transportation   PT Frequency 2x / week   PT Duration 8 weeks   PT Treatment/Interventions ADLs/Self Care Home Management;DME Instruction;Electrical Stimulation;Vestibular;Gait training;Neuromuscular re-education;Stair training;Functional mobility training;Therapeutic activities;Patient/family education;Therapeutic exercise;Balance training;Prosthetic Training;Orthotic Fit/Training;Wheelchair mobility training   PT Next Visit Plan continue gait training/ standing as tolerated   Consulted and Agree with Plan of Care Patient      Patient will benefit from skilled therapeutic intervention in order to improve the following deficits and impairments:  Postural dysfunction, Impaired tone, Abnormal gait, Decreased balance, Decreased activity tolerance, Decreased endurance, Decreased coordination, Decreased mobility, Impaired perceived functional ability, Impaired sensation, Decreased knowledge of use of DME, Decreased strength  Visit Diagnosis: Other abnormalities of gait and mobility  Spastic hemiplegia affecting right dominant side [facility])     Problem List Patient Active Problem List   Diagnosis Date Noted  . Absolute anemia   . Thrombocytopenia ([place])   . 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 AKI (acute kidney injury) ([place])   . Dysarthria   . Lethargy   . DM type 2 with diabetic peripheral neuropathy ([place])   . Labile blood pressure   .  Hyperlipidemia   . History of CVA with residual deficit   . Chronic obstructive pulmonary disease ([place])   . Hemiparesis, aphasia, and dysphagia as late effect of cerebrovascular accident (CVA) ([place])   . Acute ischemic stroke ([place])   . CVA (cerebral vascular accident) ([place]) 01/14/2015  . CVA (cerebral infarction) 01/14/2015  . Right hemiplegia ([place])   . Right sided weakness 01/13/2015  . Abdominal pain 06/20/2014  . Altered mental status 02/08/2012  . Sleep apnea 12/08/2010  . Morbid obesity ([place]) 12/08/2010  . CHF (congestive heart failure) ([place]) 12/08/2010  . Chronic renal failure 12/08/2010  . TIA (transient ischemic attack) 12/08/2010  . CAD (coronary artery disease) 12/08/2010  . Cellulitis of pubic region 12/01/2010  . Uncontrolled type 2 DM with hyperosmolar nonketotic hyperglycemia ([place]) 12/01/2010    Juliza Machnik, [place], PT 06/11/2015, 10:36 AM  [facility] [address] [place], [place], [address] Phone: [phone]   Fax:  [phone]  Name: CHERILYNN SCHOMBURG MRN: [id number] Date of Birth: November 18, 1950

## 2015-06-12 ENCOUNTER — Encounter: Payer: Self-pay | Admitting: Physical Therapy

## 2015-06-12 ENCOUNTER — Ambulatory Visit: Payer: Medicare Other | Admitting: Occupational Therapy

## 2015-06-12 ENCOUNTER — Ambulatory Visit: Payer: Medicare Other | Admitting: Physical Therapy

## 2015-06-12 ENCOUNTER — Encounter: Payer: Self-pay | Admitting: Occupational Therapy

## 2015-06-12 DIAGNOSIS — G8111 Spastic hemiplegia affecting right dominant side: Secondary | ICD-10-CM

## 2015-06-12 DIAGNOSIS — I69318 Other symptoms and signs involving cognitive functions following cerebral infarction: Secondary | ICD-10-CM

## 2015-06-12 DIAGNOSIS — R208 Other disturbances of skin sensation: Secondary | ICD-10-CM

## 2015-06-12 DIAGNOSIS — R278 Other lack of coordination: Secondary | ICD-10-CM

## 2015-06-12 DIAGNOSIS — M6281 Muscle weakness (generalized): Secondary | ICD-10-CM

## 2015-06-12 DIAGNOSIS — M79601 Pain in right arm: Secondary | ICD-10-CM | POA: Diagnosis not present

## 2015-06-12 DIAGNOSIS — R293 Abnormal posture: Secondary | ICD-10-CM | POA: Diagnosis not present

## 2015-06-12 DIAGNOSIS — R2689 Other abnormalities of gait and mobility: Secondary | ICD-10-CM

## 2015-06-12 DIAGNOSIS — R41842 Visuospatial deficit: Secondary | ICD-10-CM

## 2015-06-12 NOTE — Therapy (Addendum)
[facility] [address] [place], [place], [address] Phone: [phone]   Fax:  [phone]  Physical Therapy Treatment  Patient Details  Name: Michelle Holmes MRN: [id number] Date of Birth: 1950-03-02 Referring Provider: Alysia Penna, MD  Encounter Date: 06/12/2015      PT End of Session - 06/12/15 1414    Visit Number 10   Number of Visits 17   Date for PT Re-Evaluation 06/30/15   Authorization Type UHC MCR - G Codes and PN required every 10 visits   PT Start Time 1410  pt late for appt due to transportation   PT Stop Time 1445   PT Time Calculation (min) 35 min   Equipment Utilized During Treatment Gait belt      Past Medical History  Diagnosis Date  . Emphysema   . Colon cancer ([place])   . Hypertension   . Hypercholesteremia   . Diabetes mellitus   . COPD (chronic obstructive pulmonary disease) ([place])   . Coronary artery disease   . Obesity   . Sleep apnea   . TIA (transient ischemic attack)   . CHF (congestive heart failure) ([place])   . Anemia   . DJD (degenerative joint disease)   . Renal insufficiency   . Chronic back pain   . Scoliosis   . Obesity hypoventilation syndrome ([place])   . Stroke ([place])   . Myocardial infarction [facility])     Past Surgical History  Procedure Laterality Date  . Coronary angioplasty with stent placement    . Colon surgery      There were no vitals filed for this visit.      Subjective Assessment - 06/12/15 1413    Subjective No new complaints. No falls or pain to report today.    Pertinent History PMH significant for: CVA x2 (most recent: 01/13/15), HTN, HLD, CHF, COPD, OSA, CAD, DM II, colon cancer, DJD, scoliosis, chronic renal insufficiency   Patient Stated Goals "I want a new wheelchair and I want to walk."   Currently in Pain? No/denies      Postural Assessment Scale for Stroke Patients (PASS)  Give the subject instructions for each item as written below. When  scoring the item, record the lowest response category that applies for each item.  Maintaining a Posture  3_ 1. Sitting Without Support Instructions: Have the subject sit on a bench/mat without back support and with feet flat on the floor. (3) Can sit for 5 minutes without support (2) Can sit for more than 10 seconds without support (1) Can sit with slight support (for example, by 1 hand) (0) Cannot sit  _2_ 2. Standing With Support Instructions: Have the subject stand, providing support as needed. Evaluate only the ability to stand with or without support. Do not consider the quality of the stance. (3) Can stand with support of only 1 hand (2) Can stand with moderate support of 1 person (1) Can stand with strong support of 2 people (0) Cannot stand, even with support  _0_ 3. Standing Without Support Instructions: Have the subject stand without support. Evaluate only the ability to stand with or without support. Do not consider the quality of the stance. (3) Can stand without support for more than 1 minute and simultaneously perform arm movements at about shoulder level (2) Can stand without support for 1 minute or stands slightly asymmetrically  (1) Can stand without support for 10 seconds or leans heavily on 1 leg (0) Cannot stand  without support  _3_ 4. Standing on Nonparetic Leg Instructions: Have the subject stand on the nonparetic leg. Evaluate only the ability to bear weight entirely on the nonparetic leg. Do not consider how the subject accomplishes the task. (3) Can stand on nonparetic leg for more than 10 seconds (2) Can stand on nonparetic leg for more than 5 seconds (1) Can stand on nonparetic leg for a few seconds (0) Cannot stand on nonparetic leg  _0_ 5. Standing on Paretic Leg Instructions: Have the subject stand on the paretic leg. Evaluate only the ability to bear weight entirely on the paretic leg. Do not consider how the subject accomplishes the task. (3) Can  stand on paretic leg for more than 10 seconds (2) Can stand on paretic leg for more than 5 seconds (1) Can stand on paretic leg for a few seconds (0) Cannot stand on paretic leg  Maintaining Posture SUBTOTAL __8______  Changing a Posture  _2_ 6. Supine to Paretic Side Lateral Instructions: Begin with the subject in supine on a treatment mat. Instruct the subject to roll to the paretic side (lateral movement). Assist as necessary. Evaluate the subject's performance on the amount of help required. Do not consider the quality of performance. (3) Can perform without help (2) Can perform with little help (1) Can perform with much help (0) Cannot perform  _2_ 7. Supine to Nonparetic Side Lateral Instructions: Begin with the subject in supine on a treatment mat. Instruct the subject to roll to the nonparetic side (lateral movement). Assist as necessary. Evaluate the subject's performance on the amount of help required. Do not consider the quality of performance. (3) Can perform without help (2) Can perform with little help (1) Can perform with much help (0) Cannot perform  _2_ 8. Supine to Sitting Up on the Edge of the Mat Instructions: Begin with the subject in supine on a treatment mat. Instruct the subject to come to sitting on the edge of the mat. Assist as necessary. Evaluate the subject's performance on the amount of help required. Do not consider the quality of performance. (3) Can perform without help (2) Can perform with little help (1) Can perform with much help (0) Cannot perform  _2_ 9. Sitting on the Edge of the Mat to Supine Instructions: Begin with the on the edge of a treatment mat. Instruct the subject to return to supine. Assist as necessary. Evaluate the subject's performance on the amount of help required. Do not consider the quality of performance. (3) Can perform without help (2) Can perform with little help (1) Can perform with much help (0) Cannot  perform  _1_ 10. Sitting to Standing Up Instructions: Begin with the subject sitting on the edge of a treatment mat. Instruct the subject to stand up without support. Assist if necessary. Evaluate the subject's performance on the amount of help required. Do not consider the quality of performance. (3) Can perform without help (2) Can perform with little help (1) Can perform with much help (0) Cannot perform  1__ 11. Standing Up to Sitting Down Instructions: Begin with the subject standing by edge of a treatment mat. Instruct the subject to sit on edge of mat without support. Assist if necessary. Evaluate the subject's performance on the amount of help required. Do not consider the quality of performance. (3) Can perform without help (2) Can perform with little help (1) Can perform with much help (0) Cannot perform  0__ 12. Standing, Picking Up a Pencil from the Floor  Instructions: Begin with the subject standing. Instruct the subject to pick up a pencil fro the floor without support. Assist if necessary. Evaluate the subject's performance on the amount of help required. Do not consider the quality of performance. (3) Can perform without help (2) Can perform with little help (1) Can perform with much help (0) Cannot perform  Changing Posture SUBTOTAL ___10__   TOTAL __18/36_  treatment continued Stand pivot transfer wheelchair>mat table x 1 with max assist toward left side, cues on sequencing and technique. Sit<>stand to<>from mat table x 4 reps total with mod to min assist (min assist with 4th rep). Left knee blocked to prevent buckling.  Worked on the following in standing: - lateral weight shifting - left fwd/bwd stepping - anterior/posterior weight shifting           PT Short Term Goals - 06/03/15 1233    PT SHORT TERM GOAL #1   Title Pt will demonstrate consistent carryover of logroll technique for supine <> sit for R-side WB, increased efficiency of movement, and  increased independence with bed mobility.   (Target date: 05/29/15)   Baseline 5/2: REVISED goal from formal HEP to between-session carryover of logroll technique for bed mobility due to cognitive impairments.   Status On-going   PT SHORT TERM GOAL #2   Title Pt will perform supine <> sit with supervision, min cueing to indicate increased independence getting into/out of bed.   Baseline 5/2: Required mod A and max cueing for supine <> sit   Status Not Met   PT SHORT TERM GOAL #3   Title Pt will perform level transfers from w/c <> mat table in B directions with min A, min cueing to indicate increased independence with transfers.   Baseline 5/2: Max A to R side; Min A to L side   Status Partially Met   PT SHORT TERM GOAL #4   Title Pt will perform sit <> stand from w/c with min A, min cueing with LRAD to indicate increased pt independence with functional transfers.   Baseline 5/2: REVISED to add LRAD   Status Revised   PT SHORT TERM GOAL #5   Title Pt will perform dynamic standing task x2 consecutive minutes with single UE support and min to indicate improved postural awareness/control with functional standing.   Status On-going   PT SHORT TERM GOAL #6   Title ATP will assess current w/c to assist in determining if pt appropriate candidate for custom w/c to improve positioning, preserve skin integrity, and increase pt independence with mobility.    Baseline Met 4/25.   Status Achieved   PT SHORT TERM GOAL #7   Title Pt will improve PASS score from 12/36 to 15/36 to indicate improved independence iwth funcitonal mobility.   Status Not Met      Addendum     PT Long Term Goals - 06/15/15 1420    PT LONG TERM GOAL #1   Title Pt will consistently perform supine <> sit with mod I, no cueing to indicate increased independence getting into/out of bed.  (Target date: 06/26/15)   PT LONG TERM GOAL #2   Title Pt will perform level transfers from w/c <> mat table in B directions with supervision to  indicate increased independence with transfers.   PT LONG TERM GOAL #3   Title Pt will perform unlevel transfers in B directions with min A, min cueing for increased safety/independence with functional transfers.   PT LONG TERM GOAL #4  Title Pt will perform sit <> stand from w/c with supervision to indicate increased pt independence with functional transfers.   PT LONG TERM GOAL #5   Title Pt will improve PASS score from 12/36 to 22/36 to indicate significantly improved independence with functional mobility.   Baseline 06/13/22: REVISED from 18/36 to 22/36 due to pt progress   Status Revised   PT LONG TERM GOAL #6   Title Will attempt ambulation and set goal, if appropriate, to assess if safe/functional to address short-distance household ambulation.               Plan - 06-13-15 1414    Clinical Impression Statement Pt with improved PASS score today. Remainder of session addressed bed mobility, transfers and standing posture working toward goals. Pt is making steady progress toward goals .   Rehab Potential Fair   Clinical Impairments Affecting Rehab Potential cognitive impairments; limited family support; relies on friend for transportation   PT Frequency 2x / week   PT Duration 8 weeks   PT Treatment/Interventions ADLs/Self Care Home Management;DME Instruction;Electrical Stimulation;Vestibular;Gait training;Neuromuscular re-education;Stair training;Functional mobility training;Therapeutic activities;Patient/family education;Therapeutic exercise;Balance training;Prosthetic Training;Orthotic Fit/Training;Wheelchair mobility training   PT Next Visit Plan continue gait training/ standing as tolerated   Consulted and Agree with Plan of Care Patient      Patient will benefit from skilled therapeutic intervention in order to improve the following deficits and impairments:  Postural dysfunction, Impaired tone, Abnormal gait, Decreased balance, Decreased activity tolerance, Decreased  endurance, Decreased coordination, Decreased mobility, Impaired perceived functional ability, Impaired sensation, Decreased knowledge of use of DME, Decreased strength  Visit Diagnosis: Other abnormalities of gait and mobility  Other disturbances of skin sensation  Muscle weakness (generalized)  Spastic hemiplegia affecting right dominant side ([place])   Willow Ora, PTA, [facility] [address] [place], [place] [address] [phone] 2015/06/14, 4:02 PM        G-Codes - 2015-06-13 1602    Functional Assessment Tool Used pass 18/36     Addendum: By Billie Ruddy, PT, DPT [facility] [address] [place], [place], [address] Phone: [phone]   Fax:  [phone] 16-Jun-2015, 2:22 PM  G Codes Functional Assessment Tool: PASS 18/36 Functional Limitation: Changing and Maintaining Body Position  Current Status [phone]) - CK Goal Status (T0160) - CJ  Physical Therapy Progress Note  Dates of Reporting Period: 05/01/15 to 06/13/15  Objective Reports of Subjective Statement: See Subjection section above.  Objective Measurements: PASS 18/36; see goals for additional objective findings  Goal Update: Revised PASS goal from 18/36 to 22/36 due to pt progress.  Plan: Continue to address LTG's above.  Reason Skilled Services are Required: To maximize pt safety and independence with functional mobility, decrease fall risk, decrease caregiver burden, prevent contractures and skin breakdown.    Problem List Patient Active Problem List   Diagnosis Date Noted  . Absolute anemia   . Thrombocytopenia ([place])   . Acute ischemic VBA thalamic stroke ([place]) 01/18/2015  . AKI (acute kidney injury) ([place])   . Dysarthria   . Lethargy   . DM type 2 with diabetic peripheral neuropathy ([place])   . Labile blood pressure   . Hyperlipidemia   . History of CVA with residual deficit   . Chronic obstructive pulmonary disease ([place])    . Hemiparesis, aphasia, and dysphagia as late effect of cerebrovascular accident (CVA) ([place])   . Acute ischemic stroke ([place])   . CVA (cerebral vascular accident) (  [place]) 01/14/2015  . CVA (cerebral infarction) 01/14/2015  . Right hemiplegia ([place])   . Right sided weakness 01/13/2015  . Abdominal pain 06/20/2014  . Altered mental status 02/08/2012  . Sleep apnea 12/08/2010  . Morbid obesity ([place]) 12/08/2010  . CHF (congestive heart failure) ([place]) 12/08/2010  . Chronic renal failure 12/08/2010  . TIA (transient ischemic attack) 12/08/2010  . CAD (coronary artery disease) 12/08/2010  . Cellulitis of pubic region 12/01/2010  . Uncontrolled type 2 DM with hyperosmolar nonketotic hyperglycemia ([place]) 12/01/2010    Name: Michelle Holmes MRN: [id number] Date of Birth: [birth year]

## 2015-06-12 NOTE — Therapy (Signed)
[facility] [address] [place], [place], [address] Phone: [phone]   Fax:  [phone]  Occupational Therapy Treatment  Patient Details  Name: Michelle Holmes MRN: [id number] Date of Birth: 02-10-1950 Referring Provider: Dr. Alysia Penna  Encounter Date: 06/12/2015      OT End of Session - 06/12/15 1706    Visit Number 6   Number of Visits 9   Date for OT Re-Evaluation 06/28/15   Authorization Type UHC Medicare (no visit limit, no auth, G-code needed); medicaid 2nd   Authorization Time Period cert. 05/28/15-07/27/15   Authorization - Visit Number 6   Authorization - Number of Visits 10   OT Start Time [id number]   OT Stop Time 1528   OT Time Calculation (min) 43 min   Activity Tolerance Patient tolerated treatment well   Behavior During Therapy WFL for tasks assessed/performed      Past Medical History  Diagnosis Date  . Emphysema   . Colon cancer ([place])   . Hypertension   . Hypercholesteremia   . Diabetes mellitus   . COPD (chronic obstructive pulmonary disease) ([place])   . Coronary artery disease   . Obesity   . Sleep apnea   . TIA (transient ischemic attack)   . CHF (congestive heart failure) ([place])   . Anemia   . DJD (degenerative joint disease)   . Renal insufficiency   . Chronic back pain   . Scoliosis   . Obesity hypoventilation syndrome ([place])   . Stroke ([place])   . Myocardial infarction [facility])     Past Surgical History  Procedure Laterality Date  . Coronary angioplasty with stent placement    . Colon surgery      There were no vitals filed for this visit.      Subjective Assessment - 06/12/15 1654    Subjective  Patient tearful at beginning of session - stating she did not like rolling.  Patient indicates that she is not sleeping in her bed at home, becasue she hates lying down.   Pertinent History CVA 01/13/16, hx of previous CVA with risidual R-sided weakness, hospitalization 05/05/15 with  possible TIA or seizure disorder; also hx of HTN, MI, CHF, CAD, DM, COPD, emphysema, sleep apnea, chronic back pain, scoliosis, DJD, renal insufficiency, anemia, and colon cancer.   Patient Stated Goals get back to "all that I did before"; when questioned pt stated that she wanted to work on toileting transfer ( but inconsistent responses)                      OT Treatments/Exercises (OP) - 06/12/15 0001    ADLs   Toileting Patient has bedside commode at home which she indicates is a drop arm.  Practiced "bed" to drop arm commode getting on toward weaker right side,a nd getting off toward stronger left side.  Patient resistant initially, rolling eyes, and indicating that she did not want to practice this skill.  Patient often resistive of new tasks, possibly due to fear.  Once she completed this task multiple times successfully, her affect brightened, and she became more engaged in session,  Patient able to transfer toward weaker side with min assist squat pivot, and toward stronger side with intermittent assistance to very close supervision.     Functional Mobility Patient unable to tolerate supine position today.  Patient practiced rolling in PT session today - and reports not wanting to get back down, stating shoulder pain as her  reason.  Explained that if patient had shoulder pain, that working in supine was one way to address.  Patient became tearful, and was not able to continue in supine.     Neurological Re-education Exercises   Other Exercises 1 In sitting, facilitated more upright postural control - and sustained trunk activation to allow more isolated shoulder motion.  Following set up and faciltiation, patient able to isolate shoulder flexion and extension through ~25% range (mid range) to move unstable surface (4 legged stool)  Patient cued to lean toward right side and even to tip ear toward right shoulder to reduce tension and discomfort in right shoulder.     Other Exercises  2 In sitting began toa ddress very early pinch and hand to face pattern with right hand.  Patient able to grasp tissue with lateral pinch (inconsistent) and begin to flex elbow to bring tissue toward nose with mod cueing and min assist.                OT Education - 06/12/15 1705    Education provided Yes   Education Details Preliminary hand to mouth pattern, and prefunctional low level reach, safety with level surface transfer to drop arm commode   Person(s) Educated Patient   Methods Explanation;Demonstration   Comprehension Verbal cues required;Tactile cues required;Need further instruction             OT Long Term Goals - 06/11/15 1222    OT LONG TERM GOAL #1   Title Pt will perform self PROM HEP with mod prompts/cueing.--check goals 06/28/15   Status On-going   OT LONG TERM GOAL #2   Title Pt will participate in functional activities for at least 93min with mod prompts.   Status On-going   OT LONG TERM GOAL #3   Title Pt will tolerate/allow gentle PROM to RUE at least 75% of the time.   Status On-going   OT LONG TERM GOAL #4   Title Pt will perform transfer from w/c to [facility] with mod A.   Status On-going               Plan - 06/12/15 1706    Clinical Impression Statement Patient showing improvement in overall functional mobility.  Patient would benefit from caregiver occasionally participating in therapy to improve carryover to home.     Rehab Potential Fair   OT Frequency 2x / week   OT Duration 4 weeks   OT Treatment/Interventions Self-care/ADL training;Therapeutic exercise;Functional Mobility Training;Patient/family education;Balance training;Splinting;Manual Therapy;Neuromuscular education;Ultrasound;Energy conservation;Manual lymph drainage;Therapeutic exercises;Therapeutic activities;DME and/or AE instruction;Parrafin;Cryotherapy;Electrical Stimulation;Fluidtherapy;Cognitive remediation/compensation;Visual/perceptual remediation/compensation;Passive range  of motion;Contrast Bath;Moist Heat   Plan NMR RUE, Static to dynamic stand balance   Consulted and Agree with Plan of Care Patient      Patient will benefit from skilled therapeutic intervention in order to improve the following deficits and impairments:  Decreased activity tolerance, Decreased balance, Decreased cognition, Decreased coordination, Decreased endurance, Decreased range of motion, Decreased mobility, Decreased knowledge of use of DME, Decreased knowledge of precautions, Decreased strength, Difficulty walking, Increased edema, Impaired perceived functional ability, Impaired flexibility, Obesity, Impaired UE functional use, Impaired tone, Pain, Impaired vision/preception, Improper body mechanics  Visit Diagnosis: Spastic hemiplegia affecting right dominant side (HCC)  Other lack of coordination  Visuospatial deficit  Pain In Right Arm  Other symptoms and signs involving cognitive functions following cerebral infarction    Problem List Patient Active Problem List   Diagnosis Date Noted  . Absolute anemia   . Thrombocytopenia ([place])   .  Acute ischemic VBA thalamic stroke ([place]) 01/18/2015  . AKI (acute kidney injury) ([place])   . Dysarthria   . Lethargy   . DM type 2 with diabetic peripheral neuropathy ([place])   . Labile blood pressure   . Hyperlipidemia   . History of CVA with residual deficit   . Chronic obstructive pulmonary disease ([place])   . Hemiparesis, aphasia, and dysphagia as late effect of cerebrovascular accident (CVA) ([place])   . Acute ischemic stroke ([place])   . CVA (cerebral vascular accident) ([place]) 01/14/2015  . CVA (cerebral infarction) 01/14/2015  . Right hemiplegia ([place])   . Right sided weakness 01/13/2015  . Abdominal pain 06/20/2014  . Altered mental status 02/08/2012  . Sleep apnea 12/08/2010  . Morbid obesity ([place]) 12/08/2010  . CHF (congestive heart failure) ([place]) 12/08/2010  . Chronic renal failure 12/08/2010  . TIA (transient ischemic attack)  12/08/2010  . CAD (coronary artery disease) 12/08/2010  . Cellulitis of pubic region 12/01/2010  . Uncontrolled type 2 DM with hyperosmolar nonketotic hyperglycemia ([place]) 12/01/2010    Mariah Milling, OTR/L 06/12/2015, 5:10 PM  [facility] [address] [place], [place], [address] Phone: [phone]   Fax:  [phone]  Name: Michelle Holmes MRN: [id number] Date of Birth: 05-Jul-1950

## 2015-06-14 ENCOUNTER — Ambulatory Visit: Payer: Self-pay | Admitting: Physical Therapy

## 2015-06-17 ENCOUNTER — Ambulatory Visit: Payer: Medicare Other | Admitting: Physical Therapy

## 2015-06-17 ENCOUNTER — Ambulatory Visit: Payer: Medicare Other | Admitting: Occupational Therapy

## 2015-06-18 ENCOUNTER — Ambulatory Visit: Payer: Self-pay | Admitting: Physical Therapy

## 2015-06-19 ENCOUNTER — Ambulatory Visit: Payer: Medicare Other | Admitting: Occupational Therapy

## 2015-06-19 ENCOUNTER — Ambulatory Visit: Payer: Medicare Other | Admitting: Physical Therapy

## 2015-06-19 ENCOUNTER — Encounter: Payer: Self-pay | Admitting: Occupational Therapy

## 2015-06-19 ENCOUNTER — Encounter: Payer: Self-pay | Admitting: Physical Therapy

## 2015-06-19 DIAGNOSIS — M79601 Pain in right arm: Secondary | ICD-10-CM | POA: Diagnosis not present

## 2015-06-19 DIAGNOSIS — G8111 Spastic hemiplegia affecting right dominant side: Secondary | ICD-10-CM

## 2015-06-19 DIAGNOSIS — R208 Other disturbances of skin sensation: Secondary | ICD-10-CM

## 2015-06-19 DIAGNOSIS — I69318 Other symptoms and signs involving cognitive functions following cerebral infarction: Secondary | ICD-10-CM

## 2015-06-19 DIAGNOSIS — R2689 Other abnormalities of gait and mobility: Secondary | ICD-10-CM

## 2015-06-19 DIAGNOSIS — R278 Other lack of coordination: Secondary | ICD-10-CM | POA: Diagnosis not present

## 2015-06-19 DIAGNOSIS — M6281 Muscle weakness (generalized): Secondary | ICD-10-CM | POA: Diagnosis not present

## 2015-06-19 DIAGNOSIS — R293 Abnormal posture: Secondary | ICD-10-CM

## 2015-06-19 DIAGNOSIS — R41842 Visuospatial deficit: Secondary | ICD-10-CM

## 2015-06-19 NOTE — Therapy (Signed)
[facility] [address] [place], [place], [address] Phone: [phone]   Fax:  [phone]  Occupational Therapy Treatment  Patient Details  Name: Michelle Holmes MRN: [id number] Date of Birth: 09-Oct-1950 Referring Provider: Dr. Alysia Penna  Encounter Date: 06/19/2015      OT End of Session - 06/19/15 1713    Visit Number 7   Number of Visits 9   Date for OT Re-Evaluation 06/28/15   Authorization Type UHC Medicare (no visit limit, no auth, G-code needed); medicaid 2nd   Authorization Time Period cert. 05/28/15-07/27/15   Authorization - Visit Number 7   Authorization - Number of Visits 10   OT Start Time [id number]   OT Stop Time 1615   OT Time Calculation (min) 42 min   Activity Tolerance Patient tolerated treatment well   Behavior During Therapy WFL for tasks assessed/performed      Past Medical History  Diagnosis Date  . Emphysema   . Colon cancer ([place])   . Hypertension   . Hypercholesteremia   . Diabetes mellitus   . COPD (chronic obstructive pulmonary disease) ([place])   . Coronary artery disease   . Obesity   . Sleep apnea   . TIA (transient ischemic attack)   . CHF (congestive heart failure) ([place])   . Anemia   . DJD (degenerative joint disease)   . Renal insufficiency   . Chronic back pain   . Scoliosis   . Obesity hypoventilation syndrome ([place])   . Stroke ([place])   . Myocardial infarction [facility])     Past Surgical History  Procedure Laterality Date  . Coronary angioplasty with stent placement    . Colon surgery      There were no vitals filed for this visit.      Subjective Assessment - 06/19/15 1706    Subjective  "My right leg pooped out on me"  Patient tired after PT session.     Pertinent History CVA 01/13/16, hx of previous CVA with risidual R-sided weakness, hospitalization 05/05/15 with possible TIA or seizure disorder; also hx of HTN, MI, CHF, CAD, DM, COPD, emphysema, sleep apnea, chronic  back pain, scoliosis, DJD, renal insufficiency, anemia, and colon cancer.   Patient Stated Goals get back to "all that I did before"; when questioned pt stated that she wanted to work on toileting transfer ( but inconsistent responses)   Currently in Pain? No/denies   Pain Score 0-No pain                      OT Treatments/Exercises (OP) - 06/19/15 1707    Neurological Re-education Exercises   Other Exercises 1 Supine propped on wedge to address passive shoulder motion. Patient has poorly controlled hypertonicity / spasticity.  Patiwent with significantly impaired attention span, and very limited insight.  Patient at times able to actively relax to allow passive motion at glenohumeral joint.  Patient with very inconsistent ability to tolerate this in supine, althoyugh does best with slow, gentle stretch in calm, quiet enviroment.     Manual Therapy   Edema Management Patient witrh significant edema on right hand, especially dorsum of hand over base of thumb [facility])  Provided aggressive edema management, and demonstrated to patient how to position hand to reduce swelling, and how to "push fluid out" of right hand.  Patient did not have caregiver present.  Discussed that elevating hand in recliner at nioght may be beneficial.  Patient needs custom hand/wrist  orthosis to help align distal aspect of right UE and to hopefully reduce swelling.                  OT Education - 06/19/15 1713    Education provided Yes   Education Details edema management- massage, positioning   Person(s) Educated Patient   Methods Explanation;Demonstration   Comprehension Need further instruction;Tactile cues required;Verbal cues required             OT Long Term Goals - 06/11/15 1222    OT LONG TERM GOAL #1   Title Pt will perform self PROM HEP with mod prompts/cueing.--check goals 06/28/15   Status On-going   OT LONG TERM GOAL #2   Title Pt will participate in functional activities for at  least 31min with mod prompts.   Status On-going   OT LONG TERM GOAL #3   Title Pt will tolerate/allow gentle PROM to RUE at least 75% of the time.   Status On-going   OT LONG TERM GOAL #4   Title Pt will perform transfer from w/c to [facility] with mod A.   Status On-going               Plan - 06/19/15 1714    Clinical Impression Statement Patient showing more consistent particiaption,but progress is hindered by limited caregiver particiaption.  Patient is benefitting from OT and intent is to renew her for an additional paln of care.    Rehab Potential Fair   Clinical Impairments Affecting Rehab Potential no caregiver support (during rehab affecting carryover)   OT Frequency 2x / week   OT Duration 4 weeks   OT Treatment/Interventions Self-care/ADL training;Therapeutic exercise;Functional Mobility Training;Patient/family education;Balance training;Splinting;Manual Therapy;Neuromuscular education;Ultrasound;Energy conservation;Manual lymph drainage;Therapeutic exercises;Therapeutic activities;DME and/or AE instruction;Parrafin;Cryotherapy;Electrical Stimulation;Fluidtherapy;Cognitive remediation/compensation;Visual/perceptual remediation/compensation;Passive range of motion;Contrast Bath;Moist Heat   Plan Splint resting hand versus wrist support   Consulted and Agree with Plan of Care Patient      Patient will benefit from skilled therapeutic intervention in order to improve the following deficits and impairments:  Decreased activity tolerance, Decreased balance, Decreased cognition, Decreased coordination, Decreased endurance, Decreased range of motion, Decreased mobility, Decreased knowledge of use of DME, Decreased knowledge of precautions, Decreased strength, Difficulty walking, Increased edema, Impaired perceived functional ability, Impaired flexibility, Obesity, Impaired UE functional use, Impaired tone, Pain, Impaired vision/preception, Improper body mechanics  Visit Diagnosis: Other  disturbances of skin sensation  Muscle weakness (generalized)  Spastic hemiplegia affecting right dominant side (HCC)  Other lack of coordination  Visuospatial deficit  Pain In Right Arm  Other symptoms and signs involving cognitive functions following cerebral infarction  Abnormal posture    Problem List Patient Active Problem List   Diagnosis Date Noted  . Absolute anemia   . Thrombocytopenia ([place])   . Acute ischemic VBA thalamic stroke ([place]) 01/18/2015  . AKI (acute kidney injury) ([place])   . Dysarthria   . Lethargy   . DM type 2 with diabetic peripheral neuropathy ([place])   . Labile blood pressure   . Hyperlipidemia   . History of CVA with residual deficit   . Chronic obstructive pulmonary disease ([place])   . Hemiparesis, aphasia, and dysphagia as late effect of cerebrovascular accident (CVA) ([place])   . Acute ischemic stroke ([place])   . CVA (cerebral vascular accident) ([place]) 01/14/2015  . CVA (cerebral infarction) 01/14/2015  . Right hemiplegia ([place])   . Right sided weakness 01/13/2015  . Abdominal pain 06/20/2014  . Altered mental status 02/08/2012  . Sleep  apnea 12/08/2010  . Morbid obesity ([place]) 12/08/2010  . CHF (congestive heart failure) ([place]) 12/08/2010  . Chronic renal failure 12/08/2010  . TIA (transient ischemic attack) 12/08/2010  . CAD (coronary artery disease) 12/08/2010  . Cellulitis of pubic region 12/01/2010  . Uncontrolled type 2 DM with hyperosmolar nonketotic hyperglycemia ([place]) 12/01/2010    Mariah Milling, OTR/L 06/19/2015, 5:16 PM  [facility] [address] [place], [place], [address] Phone: [phone]   Fax:  [phone]  Name: Michelle Holmes MRN: [id number] Date of Birth: 22-Sep-1950

## 2015-06-20 DIAGNOSIS — M545 Low back pain: Secondary | ICD-10-CM | POA: Diagnosis not present

## 2015-06-20 DIAGNOSIS — E119 Type 2 diabetes mellitus without complications: Secondary | ICD-10-CM | POA: Diagnosis not present

## 2015-06-20 DIAGNOSIS — I10 Essential (primary) hypertension: Secondary | ICD-10-CM | POA: Diagnosis not present

## 2015-06-20 NOTE — Therapy (Signed)
[facility] [address] [place], [place], [address] Phone: [phone]   Fax:  [phone]  Physical Therapy Treatment  Patient Details  Name: Michelle Holmes MRN: [id number] Date of Birth: [birth year] Referring Provider: Alysia Penna, MD  Encounter Date: 06/19/2015      PT End of Session - 06/19/15 1500    Visit Number 11   Number of Visits 17   Date for PT Re-Evaluation 06/30/15   Authorization Type [employer] MCR - G Codes and PN required every 10 visits   PT Start Time 1456  pt late today due to transportation   PT Stop Time 1530   PT Time Calculation (min) 34 min   Equipment Utilized During Treatment Gait belt      Past Medical History  Diagnosis Date  . Emphysema   . Colon cancer ([place])   . Hypertension   . Hypercholesteremia   . Diabetes mellitus   . COPD (chronic obstructive pulmonary disease) ([place])   . Coronary artery disease   . Obesity   . Sleep apnea   . TIA (transient ischemic attack)   . CHF (congestive heart failure) ([place])   . Anemia   . DJD (degenerative joint disease)   . Renal insufficiency   . Chronic back pain   . Scoliosis   . Obesity hypoventilation syndrome ([place])   . Stroke ([place])   . Myocardial infarction [facility])     Past Surgical History  Procedure Laterality Date  . Coronary angioplasty with stent placement    . Colon surgery      There were no vitals filed for this visit.      Subjective Assessment - 06/19/15 1500    Subjective No new complaints. No falls or pain to report today.    Pertinent History PMH significant for: CVA x2 (most recent: 01/13/15), HTN, HLD, CHF, COPD, OSA, CAD, DM II, colon cancer, DJD, scoliosis, chronic renal insufficiency   Patient Stated Goals "I want a new wheelchair and I want to walk."   Currently in Pain? No/denies   Pain Score 0-No pain           [facility] Adult PT Treatment/Exercise - 06/19/15 1508    Bed Mobility   Rolling Right 5: Set up    Rolling Right Details (indicate cue type and reason) reminder cues on sequencing and technique   Rolling Left 4: Min guard;4: Min assist   Rolling Left Details (indicate cue type and reason) cues on sequencing and technique   Left Sidelying to Sit 4: Min assist;HOB flat;4: Min guard   Left Sidelying to Sit Details (indicate cue type and reason) min guard initially with min assist to complete trunk transition to sitting during last 10-15 degrees needed to reach full sitting   Sit to Sidelying Left 4: Min assist;HOB flat   Sit to Sidelying Left Details (indicate cue type and reason) cues on sequencing and assist to descend trunk slowly, completely clear  mat with LE's   Transfers   Transfers Sit to Stand;Stand to Sit;Stand Pivot Transfers   Sit to Stand 4: Min assist   Sit to Stand Details Verbal cues for sequencing;Verbal cues for technique;Tactile cues for placement;Tactile cues for weight shifting;Tactile cues for initiation;Manual facilitation for weight bearing   Stand to Sit 3: Mod assist   Stand to Sit Details (indicate cue type and reason) Verbal cues for sequencing;Verbal cues for technique;Verbal cues for precautions/safety;Manual facilitation for weight shifting;Manual facilitation for weight bearing  Stand Pivot Transfers 3: Mod assist;With armrests   Number of Reps Other reps (comment)   Comments multiple reps with full upright standing occuring on 3 reps, partial standing with heavy posterior lean on several occassions limiting full standing.    Lumbar Exercises: Seated   Other Seated Lumbar Exercises mat core exercises: with inverted chair behind pt modified curl ups x 10 reps with cues on maintaining midline position; lateral elbow taps to mat table and back up with emphasis on posture and to stay forward (not lean back) x 10 each way.                                 PT Short Term Goals - 06/03/15 1233    PT SHORT TERM GOAL #1   Title Pt will demonstrate consistent  carryover of logroll technique for supine <> sit for R-side WB, increased efficiency of movement, and increased independence with bed mobility.   (Target date: 05/29/15)   Baseline 5/2: REVISED goal from formal HEP to between-session carryover of logroll technique for bed mobility due to cognitive impairments.   Status On-going   PT SHORT TERM GOAL #2   Title Pt will perform supine <> sit with supervision, min cueing to indicate increased independence getting into/out of bed.   Baseline 5/2: Required mod A and max cueing for supine <> sit   Status Not Met   PT SHORT TERM GOAL #3   Title Pt will perform level transfers from w/c <> mat table in B directions with min A, min cueing to indicate increased independence with transfers.   Baseline 5/2: Max A to R side; Min A to L side   Status Partially Met   PT SHORT TERM GOAL #4   Title Pt will perform sit <> stand from w/c with min A, min cueing with LRAD to indicate increased pt independence with functional transfers.   Baseline 5/2: REVISED to add LRAD   Status Revised   PT SHORT TERM GOAL #5   Title Pt will perform dynamic standing task x2 consecutive minutes with single UE support and min to indicate improved postural awareness/control with functional standing.   Status On-going   PT SHORT TERM GOAL #6   Title ATP will assess current w/c to assist in determining if pt appropriate candidate for custom w/c to improve positioning, preserve skin integrity, and increase pt independence with mobility.    Baseline Met 4/25.   Status Achieved   PT SHORT TERM GOAL #7   Title Pt will improve PASS score from 12/36 to 15/36 to indicate improved independence iwth funcitonal mobility.   Status Not Met           PT Long Term Goals - 06/15/15 1420    PT LONG TERM GOAL #1   Title Pt will consistently perform supine <> sit with mod I, no cueing to indicate increased independence getting into/out of bed.  (Target date: 06/26/15)   PT LONG TERM GOAL #2    Title Pt will perform level transfers from w/c <> mat table in B directions with supervision to indicate increased independence with transfers.   PT LONG TERM GOAL #3   Title Pt will perform unlevel transfers in B directions with min A, min cueing for increased safety/independence with functional transfers.   PT LONG TERM GOAL #4   Title Pt will perform sit <> stand from w/c with supervision to indicate increased  pt independence with functional transfers.   PT LONG TERM GOAL #5   Title Pt will improve PASS score from 12/36 to 22/36 to indicate significantly improved independence with functional mobility.   Baseline 5/17: REVISED from 18/36 to 22/36 due to pt progress   Status Revised   PT LONG TERM GOAL #6   Title Will attempt ambulation and set goal, if appropriate, to assess if safe/functional to address short-distance household ambulation.            Plan - 06/19/15 1501    Rehab Potential Fair   Clinical Impairments Affecting Rehab Potential cognitive impairments; limited family support; relies on friend for transportation   PT Frequency 2x / week   PT Duration 8 weeks   PT Treatment/Interventions ADLs/Self Care Home Management;DME Instruction;Electrical Stimulation;Vestibular;Gait training;Neuromuscular re-education;Stair training;Functional mobility training;Therapeutic activities;Patient/family education;Therapeutic exercise;Balance training;Prosthetic Training;Orthotic Fit/Training;Wheelchair mobility training   PT Next Visit Plan continue gait training/ standing as tolerated   Consulted and Agree with Plan of Care Patient      Patient will benefit from skilled therapeutic intervention in order to improve the following deficits and impairments:  Postural dysfunction, Impaired tone, Abnormal gait, Decreased balance, Decreased activity tolerance, Decreased endurance, Decreased coordination, Decreased mobility, Impaired perceived functional ability, Impaired sensation, Decreased  knowledge of use of DME, Decreased strength  Visit Diagnosis: Other disturbances of skin sensation  Other abnormalities of gait and mobility  Abnormal posture  Muscle weakness (generalized)  Spastic hemiplegia affecting right dominant side ([place])     Problem List Patient Active Problem List   Diagnosis Date Noted  . Absolute anemia   . Thrombocytopenia ([place])   . Acute ischemic VBA thalamic stroke ([place]) 01/18/2015  . AKI (acute kidney injury) ([place])   . Dysarthria   . Lethargy   . DM type 2 with diabetic peripheral neuropathy ([place])   . Labile blood pressure   . Hyperlipidemia   . History of CVA with residual deficit   . Chronic obstructive pulmonary disease ([place])   . Hemiparesis, aphasia, and dysphagia as late effect of cerebrovascular accident (CVA) ([place])   . Acute ischemic stroke ([place])   . CVA (cerebral vascular accident) ([place]) 01/14/2015  . CVA (cerebral infarction) 01/14/2015  . Right hemiplegia ([place])   . Right sided weakness 01/13/2015  . Abdominal pain 06/20/2014  . Altered mental status 02/08/2012  . Sleep apnea 12/08/2010  . Morbid obesity ([place]) 12/08/2010  . CHF (congestive heart failure) ([place]) 12/08/2010  . Chronic renal failure 12/08/2010  . TIA (transient ischemic attack) 12/08/2010  . CAD (coronary artery disease) 12/08/2010  . Cellulitis of pubic region 12/01/2010  . Uncontrolled type 2 DM with hyperosmolar nonketotic hyperglycemia ([place]) 12/01/2010    Willow Ora, PTA, [facility] [address], [place], [place] [address] [phone] 06/20/2015, 1:52 PM   Name: Michelle Holmes MRN: [id number] Date of Birth: 09-22-1950

## 2015-06-21 ENCOUNTER — Ambulatory Visit: Payer: Self-pay | Admitting: Physical Therapy

## 2015-06-22 DIAGNOSIS — J449 Chronic obstructive pulmonary disease, unspecified: Secondary | ICD-10-CM | POA: Diagnosis not present

## 2015-06-23 DIAGNOSIS — I10 Essential (primary) hypertension: Secondary | ICD-10-CM | POA: Diagnosis not present

## 2015-06-23 DIAGNOSIS — R030 Elevated blood-pressure reading, without diagnosis of hypertension: Secondary | ICD-10-CM | POA: Diagnosis not present

## 2015-06-24 ENCOUNTER — Emergency Department (HOSPITAL_COMMUNITY)
Admission: EM | Admit: 2015-06-24 | Discharge: 2015-06-24 | Disposition: A | Discharge status: Home / Self Care | Payer: Medicaid Other | Attending: Emergency Medicine | Admitting: Emergency Medicine

## 2015-06-24 ENCOUNTER — Encounter (HOSPITAL_COMMUNITY): Payer: Self-pay | Admitting: Emergency Medicine

## 2015-06-24 DIAGNOSIS — E662 Morbid (severe) obesity with alveolar hypoventilation: Secondary | ICD-10-CM | POA: Insufficient documentation

## 2015-06-24 DIAGNOSIS — Z9119 Patient's noncompliance with other medical treatment and regimen: Secondary | ICD-10-CM | POA: Insufficient documentation

## 2015-06-24 DIAGNOSIS — Z85038 Personal history of other malignant neoplasm of large intestine: Secondary | ICD-10-CM | POA: Diagnosis not present

## 2015-06-24 DIAGNOSIS — I252 Old myocardial infarction: Secondary | ICD-10-CM | POA: Insufficient documentation

## 2015-06-24 DIAGNOSIS — I2510 Atherosclerotic heart disease of native coronary artery without angina pectoris: Secondary | ICD-10-CM | POA: Diagnosis not present

## 2015-06-24 DIAGNOSIS — I69351 Hemiplegia and hemiparesis following cerebral infarction affecting right dominant side: Secondary | ICD-10-CM | POA: Insufficient documentation

## 2015-06-24 DIAGNOSIS — J449 Chronic obstructive pulmonary disease, unspecified: Secondary | ICD-10-CM | POA: Insufficient documentation

## 2015-06-24 DIAGNOSIS — Z955 Presence of coronary angioplasty implant and graft: Secondary | ICD-10-CM | POA: Diagnosis not present

## 2015-06-24 DIAGNOSIS — I509 Heart failure, unspecified: Secondary | ICD-10-CM | POA: Insufficient documentation

## 2015-06-24 DIAGNOSIS — I110 Hypertensive heart disease with heart failure: Secondary | ICD-10-CM | POA: Diagnosis not present

## 2015-06-24 DIAGNOSIS — N289 Disorder of kidney and ureter, unspecified: Secondary | ICD-10-CM | POA: Diagnosis not present

## 2015-06-24 DIAGNOSIS — Z7982 Long term (current) use of aspirin: Secondary | ICD-10-CM | POA: Diagnosis not present

## 2015-06-24 DIAGNOSIS — G8191 Hemiplegia, unspecified affecting right dominant side: Secondary | ICD-10-CM

## 2015-06-24 DIAGNOSIS — D649 Anemia, unspecified: Secondary | ICD-10-CM | POA: Diagnosis not present

## 2015-06-24 DIAGNOSIS — R609 Edema, unspecified: Secondary | ICD-10-CM

## 2015-06-24 DIAGNOSIS — Z7901 Long term (current) use of anticoagulants: Secondary | ICD-10-CM | POA: Diagnosis not present

## 2015-06-24 DIAGNOSIS — N2889 Other specified disorders of kidney and ureter: Secondary | ICD-10-CM | POA: Diagnosis not present

## 2015-06-24 DIAGNOSIS — Z87891 Personal history of nicotine dependence: Secondary | ICD-10-CM | POA: Diagnosis not present

## 2015-06-24 DIAGNOSIS — E119 Type 2 diabetes mellitus without complications: Secondary | ICD-10-CM | POA: Insufficient documentation

## 2015-06-24 DIAGNOSIS — I10 Essential (primary) hypertension: Secondary | ICD-10-CM | POA: Diagnosis present

## 2015-06-24 DIAGNOSIS — R600 Localized edema: Secondary | ICD-10-CM | POA: Diagnosis not present

## 2015-06-24 DIAGNOSIS — Z794 Long term (current) use of insulin: Secondary | ICD-10-CM | POA: Diagnosis not present

## 2015-06-24 LAB — COMPREHENSIVE METABOLIC PANEL
ALT: 10 U/L — ABNORMAL LOW (ref 14–54)
AST: 14 U/L — ABNORMAL LOW (ref 15–41)
Albumin: 3.5 g/dL (ref 3.5–5.0)
Alkaline Phosphatase: 71 U/L (ref 38–126)
Anion gap: 8 (ref 5–15)
BUN: 32 mg/dL — ABNORMAL HIGH (ref 6–20)
CO2: 18 mmol/L — ABNORMAL LOW (ref 22–32)
Calcium: 8.9 mg/dL (ref 8.9–10.3)
Chloride: 116 mmol/L — ABNORMAL HIGH (ref 101–111)
Creatinine, Ser: 2.41 mg/dL — ABNORMAL HIGH (ref 0.44–1.00)
GFR calc Af Amer: 23 mL/min — ABNORMAL LOW (ref >60)
GFR calc non Af Amer: 20 mL/min — ABNORMAL LOW (ref >60)
Glucose, Bld: 179 mg/dL — ABNORMAL HIGH (ref 65–99)
Potassium: 3.6 mmol/L (ref 3.5–5.1)
Sodium: 142 mmol/L (ref 135–145)
Total Bilirubin: <0.1 mg/dL — ABNORMAL LOW (ref 0.3–1.2)
Total Protein: 6.3 g/dL — ABNORMAL LOW (ref 6.5–8.1)

## 2015-06-24 LAB — CBC WITH DIFFERENTIAL/PLATELET
Basophils Absolute: 0 10*3/uL (ref 0.0–0.1)
Basophils Relative: 1 %
Eosinophils Absolute: 0.2 10*3/uL (ref 0.0–0.7)
Eosinophils Relative: 4 %
HCT: 27 % — ABNORMAL LOW (ref 36.0–46.0)
Hemoglobin: 9.3 g/dL — ABNORMAL LOW (ref 12.0–15.0)
Lymphocytes Relative: 31 %
Lymphs Abs: 2.1 10*3/uL (ref 0.7–4.0)
MCH: 30.6 pg (ref 26.0–34.0)
MCHC: 34.4 g/dL (ref 30.0–36.0)
MCV: 88.8 fL (ref 78.0–100.0)
Monocytes Absolute: 0.5 10*3/uL (ref 0.1–1.0)
Monocytes Relative: 7 %
Neutro Abs: 3.8 10*3/uL (ref 1.7–7.7)
Neutrophils Relative %: 57 %
Platelets: 197 10*3/uL (ref 150–400)
RBC: 3.04 MIL/uL — ABNORMAL LOW (ref 3.87–5.11)
RDW: 15.1 % (ref 11.5–15.5)
WBC: 6.6 10*3/uL (ref 4.0–10.5)

## 2015-06-24 LAB — URINALYSIS, ROUTINE W REFLEX MICROSCOPIC
Bilirubin Urine: NEGATIVE
Glucose, UA: NEGATIVE mg/dL
Hgb urine dipstick: NEGATIVE
Ketones, ur: NEGATIVE mg/dL
Nitrite: NEGATIVE
Protein, ur: NEGATIVE mg/dL
Specific Gravity, Urine: 1.013 (ref 1.005–1.030)
pH: 5 (ref 5.0–8.0)

## 2015-06-24 LAB — URINE MICROSCOPIC-ADD ON: RBC / HPF: NONE SEEN RBC/hpf (ref 0–5)

## 2015-06-24 MED ORDER — LYRICA 50 MG PO CAPS: 50.0000 mg | ORAL_CAPSULE | Freq: Two times a day (BID) | ORAL | Status: DC

## 2015-06-24 MED ORDER — FUROSEMIDE 40 MG PO TABS: 40.0000 mg | ORAL_TABLET | Freq: Two times a day (BID) | ORAL | Status: DC

## 2015-06-24 NOTE — ED Notes (Addendum)
Patient presents from home via EMS for weakness. Patient hypertensive, not always compliant. Right sided paralysis from previous CVA. Chronic edema in bilateral legs.  Last VS 181/94.

## 2015-06-24 NOTE — Discharge Instructions (Signed)
Make sure he U do not run out of your medications. Gave her prescriptions refilled about one week before you actually need it.

## 2015-06-24 NOTE — ED Notes (Signed)
Bed: [id number] Expected date:  Expected time:  Means of arrival:  Comments:  weakness

## 2015-06-24 NOTE — ED Provider Notes (Signed)
CSN: [id number]     Arrival date & time 06/24/15  0014 History  By signing my name below, I, Michelle Holmes, attest that this documentation has been prepared under the direction and in the presence of Delora Fuel, MD. Electronically Signed: Altamease Holmes, ED Scribe. 06/24/2015. 2:12 AM   Chief Complaint  Patient presents with  . Hypertension   The history is provided by the patient. No language interpreter was used.   Brought in by EMS from home, Michelle Holmes is a 65 y.o. female with PMHx of stroke, HTN, DM, hypercholesteremia, CAD, CHF, COPD, and obesity who presents to the Emergency Department complaining of constant LE swelling with onset 2 days ago. Pt states that she has been out of Lasix (40 mg BID) and Lyrica (50 MG BID) for 2 weeks. Pt denies headache, chest pain, SOB, nausea, and weakness. Her PCP is Dr. Terrence Dupont.    Past Medical History  Diagnosis Date  . Emphysema   . Colon cancer ([place])   . Hypertension   . Hypercholesteremia   . Diabetes mellitus   . COPD (chronic obstructive pulmonary disease) ([place])   . Coronary artery disease   . Obesity   . Sleep apnea   . TIA (transient ischemic attack)   . CHF (congestive heart failure) ([place])   . Anemia   . DJD (degenerative joint disease)   . Renal insufficiency   . Chronic back pain   . Scoliosis   . Obesity hypoventilation syndrome ([place])   . Stroke ([place])   . Myocardial infarction [facility])    Past Surgical History  Procedure Laterality Date  . Coronary angioplasty with stent placement    . Colon surgery     No family history on file. Social History  Substance Use Topics  . Smoking status: Former Research scientist (life sciences)  . Smokeless tobacco: None  . Alcohol Use: No   OB History    No data available     Review of Systems  Respiratory: Negative for shortness of breath.   Cardiovascular: Positive for leg swelling. Negative for chest pain.  Gastrointestinal: Negative for nausea.  Neurological: Negative for weakness and headaches.   All other systems reviewed and are negative.  Allergies  Penicillins  Home Medications   Prior to Admission medications   Medication Sig Start Date End Date Taking? Authorizing Provider  ADVAIR DISKUS 250-50 MCG/DOSE AEPB Inhale 1 puff into the lungs 2 (two) times daily.  08/22/14  Yes Historical Provider, MD  amLODipine (NORVASC) 2.5 MG tablet Take 1 tablet (2.5 mg total) by mouth daily. 05/07/15  Yes Charolette Forward, MD  aspirin EC 81 MG EC tablet Take 1 tablet (81 mg total) by mouth daily. Patient taking differently: Take 81 mg by mouth at bedtime.  06/22/14  Yes Charolette Forward, MD  atorvastatin (LIPITOR) 40 MG tablet Take 1 tablet (40 mg total) by mouth daily. Patient taking differently: Take 40 mg by mouth daily at 6 PM.  02/07/15  Yes Lavon Paganini Angiulli, PA-C  clopidogrel (PLAVIX) 75 MG tablet Take 1 tablet (75 mg total) by mouth daily. Patient taking differently: Take 75 mg by mouth at bedtime.  02/07/15  Yes Daniel J Angiulli, PA-C  fenofibrate 54 MG tablet Take 1 tablet (54 mg total) by mouth daily. Patient taking differently: Take 54 mg by mouth at bedtime.  02/07/15  Yes Daniel J Angiulli, PA-C  ferrous sulfate 325 (65 FE) MG tablet Take 1 tablet (325 mg total) by mouth 2 (two) times daily with a  meal. 02/07/15  Yes Daniel J Angiulli, PA-C  glipiZIDE (GLUCOTROL) 5 MG tablet Take 1 tablet (5 mg total) by mouth daily before breakfast. 02/07/15  Yes Daniel J Angiulli, PA-C  insulin glargine (LANTUS) 100 UNIT/ML injection Inject 0.3 mLs (30 Units total) into the skin at bedtime. Patient taking differently: Inject 60 Units into the skin at bedtime.  02/07/15  Yes Daniel J Angiulli, PA-C  LYRICA 50 MG capsule Take 1 capsule (50 mg total) by mouth 2 (two) times daily. 02/07/15  Yes Daniel J Angiulli, PA-C  Oxycodone HCl 20 MG TABS Take 1 tablet by mouth 3 (three) times daily. 06/20/15  Yes Historical Provider, MD  oxyCODONE-acetaminophen (PERCOCET/ROXICET) 5-325 MG tablet Take 1 tablet by mouth as  needed. For break through pain. 03/21/15  Yes Historical Provider, MD  pantoprazole (PROTONIX) 40 MG tablet Take 1 tablet (40 mg total) by mouth daily. Patient taking differently: Take 40 mg by mouth at bedtime.  02/07/15  Yes Daniel J Angiulli, PA-C  ciprofloxacin (CIPRO) 250 MG tablet Take 1 tablet (250 mg total) by mouth 2 (two) times daily. 05/07/15   Charolette Forward, MD   BP 148/81 mmHg  Pulse 79  Temp(Src) 98.1 F (36.7 C) (Oral)  Resp 21  SpO2 96% Physical Exam  Constitutional: She is oriented to person, place, and time. She appears well-developed and well-nourished. No distress.  HENT:  Head: Normocephalic and atraumatic.  Eyes: EOM are normal. Pupils are equal, round, and reactive to light.  Neck: Normal range of motion. Neck supple. No JVD present.  Cardiovascular: Normal rate, regular rhythm and normal heart sounds.   No murmur heard. Pulmonary/Chest: Effort normal and breath sounds normal. She has no wheezes. She has no rales. She exhibits no tenderness.  Abdominal: Soft. Bowel sounds are normal. She exhibits no distension and no mass. There is no tenderness.  Musculoskeletal: Normal range of motion. She exhibits edema.  3+ pitting edema BLEs   Lymphadenopathy:    She has no cervical adenopathy.  Neurological: She is alert and oriented to person, place, and time.  Right hemiparesis   Skin: Skin is warm and dry. No rash noted.  Psychiatric: She has a normal mood and affect. Her behavior is normal. Judgment and thought content normal.  Nursing note and vitals reviewed.   ED Course  Procedures (including critical care time) DIAGNOSTIC STUDIES: Oxygen Saturation is 96% on RA, normal by my interpretation.    COORDINATION OF CARE: 2:10 AM Discussed treatment plan which includes lab work with pt at bedside and pt agreed to plan.  Labs Review Results for orders placed or performed during the hospital encounter of 06/24/15  Comprehensive metabolic panel  Result Value Ref  Range   Sodium 142 135 - 145 mmol/L   Potassium 3.6 3.5 - 5.1 mmol/L   Chloride 116 (H) 101 - 111 mmol/L   CO2 18 (L) 22 - 32 mmol/L   Glucose, Bld 179 (H) 65 - 99 mg/dL   BUN 32 (H) 6 - 20 mg/dL   Creatinine, Ser 2.41 (H) 0.44 - 1.00 mg/dL   Calcium 8.9 8.9 - 10.3 mg/dL   Total Protein 6.3 (L) 6.5 - 8.1 g/dL   Albumin 3.5 3.5 - 5.0 g/dL   AST 14 (L) 15 - 41 U/L   ALT 10 (L) 14 - 54 U/L   Alkaline Phosphatase 71 38 - 126 U/L   Total Bilirubin <0.1 (L) 0.3 - 1.2 mg/dL   GFR calc non Af Amer 20 (L) >60  mL/min   GFR calc Af Amer 23 (L) >60 mL/min   Anion gap 8 5 - 15  CBC with Differential  Result Value Ref Range   WBC 6.6 4.0 - 10.5 K/uL   RBC 3.04 (L) 3.87 - 5.11 MIL/uL   Hemoglobin 9.3 (L) 12.0 - 15.0 g/dL   HCT 27.0 (L) 36.0 - 46.0 %   MCV 88.8 78.0 - 100.0 fL   MCH 30.6 26.0 - 34.0 pg   MCHC 34.4 30.0 - 36.0 g/dL   RDW 15.1 11.5 - 15.5 %   Platelets 197 150 - 400 K/uL   Neutrophils Relative % 57 %   Neutro Abs 3.8 1.7 - 7.7 K/uL   Lymphocytes Relative 31 %   Lymphs Abs 2.1 0.7 - 4.0 K/uL   Monocytes Relative 7 %   Monocytes Absolute 0.5 0.1 - 1.0 K/uL   Eosinophils Relative 4 %   Eosinophils Absolute 0.2 0.0 - 0.7 K/uL   Basophils Relative 1 %   Basophils Absolute 0.0 0.0 - 0.1 K/uL  Urinalysis, Routine w reflex microscopic  Result Value Ref Range   Color, Urine YELLOW YELLOW   APPearance CLOUDY (A) CLEAR   Specific Gravity, Urine 1.013 1.005 - 1.030   pH 5.0 5.0 - 8.0   Glucose, UA NEGATIVE NEGATIVE mg/dL   Hgb urine dipstick NEGATIVE NEGATIVE   Bilirubin Urine NEGATIVE NEGATIVE   Ketones, ur NEGATIVE NEGATIVE mg/dL   Protein, ur NEGATIVE NEGATIVE mg/dL   Nitrite NEGATIVE NEGATIVE   Leukocytes, UA SMALL (A) NEGATIVE  Urine microscopic-add on  Result Value Ref Range   Squamous Epithelial / LPF 0-5 (A) NONE SEEN   WBC, UA 6-30 0 - 5 WBC/hpf   RBC / HPF NONE SEEN 0 - 5 RBC/hpf   Bacteria, UA MANY (A) NONE SEEN   I have personally reviewed and evaluated these  lab results as part of my medical decision-making.   MDM   Final diagnoses:  Peripheral edema  Renal insufficiency  Normochromic normocytic anemia  Right hemiparesis (HCC)    Increasing peripheral edema secondary to medication noncompliance. Triage note references elevated blood pressure at home and weakness. Patient denies either of those are an issue. Screening labs are obtained showing renal insufficiency unchanged from baseline and normocytic anemia unchanged from baseline. Urinalysis does show presence of bacteria but she has no urinary symptoms to suggest urinary tract infection, so antibiotics are not initiated. She is given new prescriptions for furosemide and Lyrica and is referred back to her primary care provider. Old records are reviewed confirming history of stroke with residual right hemi-paresis.  I personally performed the services described in this documentation, which was scribed in my presence. The recorded information has been reviewed and is accurate.      Delora Fuel, MD [id number]

## 2015-06-25 ENCOUNTER — Ambulatory Visit: Payer: Medicare Other | Admitting: Occupational Therapy

## 2015-06-25 ENCOUNTER — Ambulatory Visit: Payer: Medicare Other | Admitting: Physical Therapy

## 2015-06-25 ENCOUNTER — Encounter: Payer: Self-pay | Admitting: Physical Therapy

## 2015-06-25 DIAGNOSIS — G8111 Spastic hemiplegia affecting right dominant side: Secondary | ICD-10-CM

## 2015-06-25 DIAGNOSIS — M6281 Muscle weakness (generalized): Secondary | ICD-10-CM

## 2015-06-25 DIAGNOSIS — R208 Other disturbances of skin sensation: Secondary | ICD-10-CM

## 2015-06-25 DIAGNOSIS — R293 Abnormal posture: Secondary | ICD-10-CM

## 2015-06-25 DIAGNOSIS — R278 Other lack of coordination: Secondary | ICD-10-CM | POA: Diagnosis not present

## 2015-06-25 DIAGNOSIS — R2689 Other abnormalities of gait and mobility: Secondary | ICD-10-CM

## 2015-06-25 DIAGNOSIS — M79601 Pain in right arm: Secondary | ICD-10-CM

## 2015-06-25 DIAGNOSIS — I69318 Other symptoms and signs involving cognitive functions following cerebral infarction: Secondary | ICD-10-CM | POA: Diagnosis not present

## 2015-06-25 DIAGNOSIS — R41842 Visuospatial deficit: Secondary | ICD-10-CM

## 2015-06-25 NOTE — Patient Instructions (Signed)
Hand Splint    Wearing Schedule: Day Night As needed __2__ hours per session. __3__ sessions per day. First time wearing splint-Check skin condition after _30___ minutes for changes in color, irritation or swelling. Make note of any swelling, or pain, or red areas.  If these occur- STOP wearing the splint and bring it to your next appointment.  Copyright  VHI. All rights reserved.

## 2015-06-25 NOTE — Therapy (Signed)
[facility] [address] [place], [place], [address] Phone: [phone]   Fax:  [phone]  Physical Therapy Treatment  Patient Details  Name: Michelle Holmes MRN: [id number] Date of Birth: September 30, 1950 Referring Provider: Alysia Penna, MD  Encounter Date: 06/25/2015      PT End of Session - 06/25/15 0946    Visit Number 12   Number of Visits 17   Date for PT Re-Evaluation 06/30/15   Authorization Type UHC MCR - G Codes and PN required every 10 visits   PT Start Time 0940  pt late due to transportation   PT Stop Time 1015   PT Time Calculation (min) 35 min   Equipment Utilized During Treatment Gait belt      Past Medical History  Diagnosis Date  . Emphysema   . Colon cancer ([place])   . Hypertension   . Hypercholesteremia   . Diabetes mellitus   . COPD (chronic obstructive pulmonary disease) ([place])   . Coronary artery disease   . Obesity   . Sleep apnea   . TIA (transient ischemic attack)   . CHF (congestive heart failure) ([place])   . Anemia   . DJD (degenerative joint disease)   . Renal insufficiency   . Chronic back pain   . Scoliosis   . Obesity hypoventilation syndrome ([place])   . Stroke ([place])   . Myocardial infarction [facility])     Past Surgical History  Procedure Laterality Date  . Coronary angioplasty with stent placement    . Colon surgery      There were no vitals filed for this visit.      Subjective Assessment - 06/25/15 0946    Subjective No new complaints. No falls or pain to report today.    Pertinent History PMH significant for: CVA x2 (most recent: 01/13/15), HTN, HLD, CHF, COPD, OSA, CAD, DM II, colon cancer, DJD, scoliosis, chronic renal insufficiency   Patient Stated Goals "I want a new wheelchair and I want to walk."   Currently in Pain? No/denies   Pain Score 0-No pain             [facility] Adult PT Treatment/Exercise - 06/25/15 0950    Bed Mobility   Rolling Right 5: Set  up;Other (comment)  x 3 reps   Rolling Right Details (indicate cue type and reason) reminder cues on sequencing and technique, manual guidance needed at times   Rolling Left 4: Min assist  x 3 reps   Rolling Left Details (indicate cue type and reason) cues on sequencing and technique   Left Sidelying to Sit 4: Min assist;HOB flat   Left Sidelying to Sit Details (indicate cue type and reason) min assist to complete trunk transition into sitting at edge of mat   Sit to Sidelying Left 4: Min assist;HOB flat   Sit to Sidelying Left Details (indicate cue type and reason) assist needed to completely clear bed surface with legs while lying down   Transfers   Transfers Sit to Stand;Stand to Sit;Stand Pivot Transfers   Sit to Stand 4: Min assist;With upper extremity assist;From chair/3-in-1;3: Mod assist   Sit to Stand Details Verbal cues for sequencing;Verbal cues for technique;Tactile cues for placement;Tactile cues for weight shifting;Tactile cues for initiation;Manual facilitation for weight bearing   Sit to Stand Details (indicate cue type and reason) decreased assistance as reps progressed. right knee blocked throughtout all stands with cues on proper hand placement and for anterior weight shifting  Stand to Sit 3: Mod assist;With upper extremity assist;Uncontrolled descent;To bed   Stand to Sit Details (indicate cue type and reason) Verbal cues for sequencing;Verbal cues for technique;Verbal cues for precautions/safety;Manual facilitation for weight shifting;Manual facilitation for weight bearing   Stand to Sit Details cues to reach back and assist needed for controlled descent with sitting.   Stand Pivot Transfers 3: Mod assist;With armrests   Stand Pivot Transfer Details (indicate cue type and reason) wheelchair <> mat transfering to stronger side each time with right knee blocked and cues on technique and sequencing.                        Ambulation/Gait   Ambulation/Gait Yes    Ambulation/Gait Assistance 3: Mod assist  maximum blocking of right knee, 2cd person for safety/chair    Ambulation/Gait Assistance Details right knee blocked with all gait to prevent buckling;cues/facilitaiton for upright posture, weight shifting and for increased left step length with gait.                            Ambulation Distance (Feet) 10 Feet   Assistive device Parallel bars   Gait Pattern Decreased hip/knee flexion - right;Decreased step length - left;Poor foot clearance - right;Scissoring;Decreased weight shift to right;Decreased dorsiflexion - right   Ambulation Surface Level;Indoor            PT Short Term Goals - 06/03/15 1233    PT SHORT TERM GOAL #1   Title Pt will demonstrate consistent carryover of logroll technique for supine <> sit for R-side WB, increased efficiency of movement, and increased independence with bed mobility.   (Target date: 05/29/15)   Baseline 5/2: REVISED goal from formal HEP to between-session carryover of logroll technique for bed mobility due to cognitive impairments.   Status On-going   PT SHORT TERM GOAL #2   Title Pt will perform supine <> sit with supervision, min cueing to indicate increased independence getting into/out of bed.   Baseline 5/2: Required mod A and max cueing for supine <> sit   Status Not Met   PT SHORT TERM GOAL #3   Title Pt will perform level transfers from w/c <> mat table in B directions with min A, min cueing to indicate increased independence with transfers.   Baseline 5/2: Max A to R side; Min A to L side   Status Partially Met   PT SHORT TERM GOAL #4   Title Pt will perform sit <> stand from w/c with min A, min cueing with LRAD to indicate increased pt independence with functional transfers.   Baseline 5/2: REVISED to add LRAD   Status Revised   PT SHORT TERM GOAL #5   Title Pt will perform dynamic standing task x2 consecutive minutes with single UE support and min to indicate improved postural awareness/control  with functional standing.   Status On-going   PT SHORT TERM GOAL #6   Title ATP will assess current w/c to assist in determining if pt appropriate candidate for custom w/c to improve positioning, preserve skin integrity, and increase pt independence with mobility.    Baseline Met 4/25.   Status Achieved   PT SHORT TERM GOAL #7   Title Pt will improve PASS score from 12/36 to 15/36 to indicate improved independence iwth funcitonal mobility.   Status Not Met           PT Long Term Goals -  06/25/15 0948    PT LONG TERM GOAL #1   Title Pt will consistently perform supine <> sit with mod I, no cueing to indicate increased independence getting into/out of bed.  (Target date: 06/26/15)   Baseline 06/25/15: needs up to min assist to complete transfer   Status On-going   PT LONG TERM GOAL #2   Title Pt will perform level transfers from w/c <> mat table in B directions with supervision to indicate increased independence with transfers.   Baseline 06/25/15; pt is supervision to laterally scoot along mat edge, not for wheelchair <>mat transfers   Status Partially Met   PT LONG TERM GOAL #3   Title Pt will perform unlevel transfers in B directions with min A, min cueing for increased safety/independence with functional transfers.   Baseline 06/25/15: needs up to min assist with moderate cues   Status On-going   PT LONG TERM GOAL #4   Title Pt will perform sit <> stand from w/c with supervision to indicate increased pt independence with functional transfers.   Baseline 06/25/15: pt is inconsistent in assistance needed, ranging from min guard assist to mod assist.   Status On-going   PT LONG TERM GOAL #5   Title Pt will improve PASS score from 12/36 to 22/36 to indicate significantly improved independence with functional mobility.   Baseline 5/17: REVISED from 18/36 to 22/36 due to pt progress   Status Revised   PT LONG TERM GOAL #6   Title Will attempt ambulation and set goal, if appropriate, to  assess if safe/functional to address short-distance household ambulation.   Baseline 06/25/15: gait initiated in parallel bars for a least 2 sessions now. pt does need assistance at this time. ? need for orthotic on right leg due to knee buckling.. PT to further assess and set goals.   Status Achieved             Plan - 06/25/15 0947    Clinical Impression Statement Today's session continued to work on mobility and gait in parallel bars, while beginning to address LTGs. Pt has not met goals, however is slowly progressing towards them.   Rehab Potential Fair   Clinical Impairments Affecting Rehab Potential cognitive impairments; limited family support; relies on friend for transportation   PT Frequency 2x / week   PT Duration 8 weeks   PT Treatment/Interventions ADLs/Self Care Home Management;DME Instruction;Electrical Stimulation;Vestibular;Gait training;Neuromuscular re-education;Stair training;Functional mobility training;Therapeutic activities;Patient/family education;Therapeutic exercise;Balance training;Prosthetic Training;Orthotic Fit/Training;Wheelchair mobility training   PT Next Visit Plan continue gait training/ standing as tolerated; assess remaining LTGs.   Consulted and Agree with Plan of Care Patient      Patient will benefit from skilled therapeutic intervention in order to improve the following deficits and impairments:  Postural dysfunction, Impaired tone, Abnormal gait, Decreased balance, Decreased activity tolerance, Decreased endurance, Decreased coordination, Decreased mobility, Impaired perceived functional ability, Impaired sensation, Decreased knowledge of use of DME, Decreased strength  Visit Diagnosis: Other abnormalities of gait and mobility  Abnormal posture  Muscle weakness (generalized)  Spastic hemiplegia affecting right dominant side [facility])     Problem List Patient Active Problem List   Diagnosis Date Noted  . Absolute anemia   . Thrombocytopenia  ([place])   . Acute ischemic VBA thalamic stroke ([place]) 01/18/2015  . AKI (acute kidney injury) ([place])   . Dysarthria   . Lethargy   . DM type 2 with diabetic peripheral neuropathy ([place])   . Labile blood pressure   . Hyperlipidemia   .  History of CVA with residual deficit   . Chronic obstructive pulmonary disease ([place])   . Hemiparesis, aphasia, and dysphagia as late effect of cerebrovascular accident (CVA) ([place])   . Acute ischemic stroke ([place])   . CVA (cerebral vascular accident) ([place]) 01/14/2015  . CVA (cerebral infarction) 01/14/2015  . Right hemiplegia ([place])   . Right sided weakness 01/13/2015  . Abdominal pain 06/20/2014  . Altered mental status 02/08/2012  . Sleep apnea 12/08/2010  . Morbid obesity ([place]) 12/08/2010  . CHF (congestive heart failure) ([place]) 12/08/2010  . Chronic renal failure 12/08/2010  . TIA (transient ischemic attack) 12/08/2010  . CAD (coronary artery disease) 12/08/2010  . Cellulitis of pubic region 12/01/2010  . Uncontrolled type 2 DM with hyperosmolar nonketotic hyperglycemia ([employer]) 12/01/2010    Willow Ora, PTA, [facility] [address], [place], [place] [address] [phone] 06/25/2015, 4:21 PM   Name: Michelle Holmes MRN: [id number] Date of Birth: 08/03/1950

## 2015-06-25 NOTE — Therapy (Signed)
[facility] [address] [place], [place], [address] Phone: [phone]   Fax:  [phone]  Occupational Therapy Treatment  Patient Details  Name: Michelle Holmes MRN: [id number] Date of Birth: [birth year] Referring Provider: Dr. Alysia Penna  Encounter Date: 06/25/2015      OT End of Session - 06/25/15 1220    Visit Number 8   Number of Visits 9   Date for OT Re-Evaluation 06/28/15   Authorization Type UHC Medicare (no visit limit, no auth, G-code needed); medicaid 2nd   Authorization Time Period cert. 05/28/15-07/27/15   Authorization - Visit Number 8   Authorization - Number of Visits 10   OT Start Time 1017   OT Stop Time 1101   OT Time Calculation (min) 44 min   Activity Tolerance Patient tolerated treatment well   Behavior During Therapy WFL for tasks assessed/performed      Past Medical History  Diagnosis Date  . Emphysema   . Colon cancer ([place])   . Hypertension   . Hypercholesteremia   . Diabetes mellitus   . COPD (chronic obstructive pulmonary disease) ([place])   . Coronary artery disease   . Obesity   . Sleep apnea   . TIA (transient ischemic attack)   . CHF (congestive heart failure) ([place])   . Anemia   . DJD (degenerative joint disease)   . Renal insufficiency   . Chronic back pain   . Scoliosis   . Obesity hypoventilation syndrome ([place])   . Stroke ([place])   . Myocardial infarction [facility])     Past Surgical History  Procedure Laterality Date  . Coronary angioplasty with stent placement    . Colon surgery      There were no vitals filed for this visit.      Subjective Assessment - 06/25/15 1059    Subjective  I went to the hospital this weekend- my legs was hurting so bad- so swollen.   Pertinent History CVA 01/13/16, hx of previous CVA with risidual R-sided weakness, hospitalization 05/05/15 with possible TIA or seizure disorder; also hx of HTN, MI, CHF, CAD, DM, COPD, emphysema, sleep apnea,  chronic back pain, scoliosis, DJD, renal insufficiency, anemia, and colon cancer.   Patient Stated Goals get back to "all that I did before"; when questioned pt stated that she wanted to work on toileting transfer ( but inconsistent responses)   Currently in Pain? No/denies   Pain Score 0-No pain                      OT Treatments/Exercises (OP) - 06/25/15 1212    Splinting   Splinting Fabricated a resting hand splint for right hand.  Forearm based volar splint to reduce excessive wrist and finger flexion, and to promote neutral wrist position, and extension in digits.  Patient has had consistent reports of discomfort with stretch to right  hand, especially digit extension, however with prolonged positioning to make spliint- patient reporting surprise at reduction in pain in right hand.  Patient recently put on lasix after hospital visit this weekend.  Patient with report of decreased pain and fluid in LE's, although right hand remains quite edematous specifically dorsum of hand.  Patient is attempting to complete edema massage at home - but she reports completing this once over the weekend.  Encouraged patient to complete massage several times / day every day.  ,  OT Education - 06/25/15 1219    Education provided Yes   Education Details splint wear and care, splint schedule on 2 hours at a time, on all night if tolerates   Person(s) Educated Patient   Methods Explanation;Demonstration   Comprehension Need further instruction;Verbalized understanding             OT Long Term Goals - 06/25/15 1225    OT LONG TERM GOAL #1   Title Pt will perform self PROM HEP with mod prompts/cueing.--check goals 06/28/15   Status On-going   OT LONG TERM GOAL #2   Title Pt will participate in functional activities for at least 71min with mod prompts.   Status Achieved   OT LONG TERM GOAL #3   Title Pt will tolerate/allow gentle PROM to RUE at least 75% of the time.    Status Achieved   OT LONG TERM GOAL #4   Title Pt will perform transfer from w/c to [facility] with mod A.   Status On-going               Plan - 06/25/15 1221    Clinical Impression Statement Patient with consistent attendance, and improved particiaption in OT.  Patient with significant physical and cognitive impairment - would benefit greatly from caregiver participation in OT sessions for improved carryover to home setting.     Rehab Potential Fair   Clinical Impairments Affecting Rehab Potential no caregiver support (during rehab affecting carryover)   OT Frequency 2x / week   OT Duration 4 weeks   OT Treatment/Interventions Self-care/ADL training;Therapeutic exercise;Functional Mobility Training;Patient/family education;Balance training;Splinting;Manual Therapy;Neuromuscular education;Ultrasound;Energy conservation;Manual lymph drainage;Therapeutic exercises;Therapeutic activities;DME and/or AE instruction;Parrafin;Cryotherapy;Electrical Stimulation;Fluidtherapy;Cognitive remediation/compensation;Visual/perceptual remediation/compensation;Passive range of motion;Contrast Bath;Moist Heat   Plan Inquire as to splint effectiveness - any changes to splint?, NMR RUE, trunk, functional transfers   OT Home Exercise Plan edema massage   Consulted and Agree with Plan of Care Patient      Patient will benefit from skilled therapeutic intervention in order to improve the following deficits and impairments:  Decreased activity tolerance, Decreased balance, Decreased cognition, Decreased coordination, Decreased endurance, Decreased range of motion, Decreased mobility, Decreased knowledge of use of DME, Decreased knowledge of precautions, Decreased strength, Difficulty walking, Increased edema, Impaired perceived functional ability, Impaired flexibility, Obesity, Impaired UE functional use, Impaired tone, Pain, Impaired vision/preception, Improper body mechanics  Visit Diagnosis: Other disturbances  of skin sensation  Abnormal posture  Muscle weakness (generalized)  Spastic hemiplegia affecting right dominant side (HCC)  Other lack of coordination  Visuospatial deficit  Pain In Right Arm  Other symptoms and signs involving cognitive functions following cerebral infarction    Problem List Patient Active Problem List   Diagnosis Date Noted  . Absolute anemia   . Thrombocytopenia ([place])   . Acute ischemic VBA thalamic stroke ([place]) 01/18/2015  . AKI (acute kidney injury) ([place])   . Dysarthria   . Lethargy   . DM type 2 with diabetic peripheral neuropathy ([place])   . Labile blood pressure   . Hyperlipidemia   . History of CVA with residual deficit   . Chronic obstructive pulmonary disease ([place])   . Hemiparesis, aphasia, and dysphagia as late effect of cerebrovascular accident (CVA) ([place])   . Acute ischemic stroke ([place])   . CVA (cerebral vascular accident) ([place]) 01/14/2015  . CVA (cerebral infarction) 01/14/2015  . Right hemiplegia ([place])   . Right sided weakness 01/13/2015  . Abdominal pain 06/20/2014  . Altered mental status 02/08/2012  . Sleep  apnea 12/08/2010  . Morbid obesity ([place]) 12/08/2010  . CHF (congestive heart failure) ([place]) 12/08/2010  . Chronic renal failure 12/08/2010  . TIA (transient ischemic attack) 12/08/2010  . CAD (coronary artery disease) 12/08/2010  . Cellulitis of pubic region 12/01/2010  . Uncontrolled type 2 DM with hyperosmolar nonketotic hyperglycemia ([place]) 12/01/2010    Mariah Milling, OTR/L 06/25/2015, 12:27 PM  [facility] [address] [place], [place], [address] Phone: [phone]   Fax:  [phone]  Name: JOLINE SLOMINSKI MRN: [id number] Date of Birth: [birth year]

## 2015-06-28 ENCOUNTER — Ambulatory Visit: Payer: 59 | Attending: Physical Medicine & Rehabilitation | Admitting: Physical Therapy

## 2015-06-28 DIAGNOSIS — M6289 Other specified disorders of muscle: Secondary | ICD-10-CM | POA: Insufficient documentation

## 2015-06-28 DIAGNOSIS — R208 Other disturbances of skin sensation: Secondary | ICD-10-CM | POA: Diagnosis not present

## 2015-06-28 DIAGNOSIS — M6281 Muscle weakness (generalized): Secondary | ICD-10-CM | POA: Diagnosis not present

## 2015-06-28 DIAGNOSIS — R41842 Visuospatial deficit: Secondary | ICD-10-CM | POA: Insufficient documentation

## 2015-06-28 DIAGNOSIS — R293 Abnormal posture: Secondary | ICD-10-CM | POA: Diagnosis not present

## 2015-06-28 DIAGNOSIS — R278 Other lack of coordination: Secondary | ICD-10-CM | POA: Diagnosis not present

## 2015-06-28 DIAGNOSIS — I69318 Other symptoms and signs involving cognitive functions following cerebral infarction: Secondary | ICD-10-CM | POA: Insufficient documentation

## 2015-06-28 DIAGNOSIS — M79601 Pain in right arm: Secondary | ICD-10-CM | POA: Diagnosis not present

## 2015-06-28 DIAGNOSIS — G8111 Spastic hemiplegia affecting right dominant side: Secondary | ICD-10-CM | POA: Diagnosis not present

## 2015-06-28 DIAGNOSIS — R2689 Other abnormalities of gait and mobility: Secondary | ICD-10-CM | POA: Insufficient documentation

## 2015-06-28 NOTE — Therapy (Signed)
[facility] [address] [place], [place], [address] Phone: [phone]   Fax:  [phone]  Physical Therapy Treatment  Patient Details  Name: Michelle Holmes MRN: [id number] Date of Birth: 01-25-1951 Referring Provider: Alysia Penna, MD  Encounter Date: 06/28/2015      PT End of Session - 06/28/15 1746    Visit Number 13   Number of Visits 21  requesting 8 additional PT visits   Date for PT Re-Evaluation 08/27/15   Authorization Type [employer] MCR - G Codes and PN required every 10 visits   PT Start Time 0933   PT Stop Time 1015   PT Time Calculation (min) 42 min   Equipment Utilized During Treatment Gait belt   Activity Tolerance Patient tolerated treatment well   Behavior During Therapy [facility] for tasks assessed/performed      Past Medical History  Diagnosis Date  . Emphysema   . Colon cancer ([place])   . Hypertension   . Hypercholesteremia   . Diabetes mellitus   . COPD (chronic obstructive pulmonary disease) ([place])   . Coronary artery disease   . Obesity   . Sleep apnea   . TIA (transient ischemic attack)   . CHF (congestive heart failure) ([place])   . Anemia   . DJD (degenerative joint disease)   . Renal insufficiency   . Chronic back pain   . Scoliosis   . Obesity hypoventilation syndrome ([place])   . Stroke ([place])   . Myocardial infarction [facility])     Past Surgical History  Procedure Laterality Date  . Coronary angioplasty with stent placement    . Colon surgery      There were no vitals filed for this visit.                       [facility] Adult PT Treatment/Exercise - 06/28/15 0001    Bed Mobility   Rolling Right 5: Set up   Rolling Right Details (indicate cue type and reason) Setup for RUE management only   Rolling Left 4: Min assist   Rolling Left Details (indicate cue type and reason) Manual facilitation to advance RUE across midline to initiate rolling.   Supine to Sit 4: Min assist    Supine to Sit Details (indicate cue type and reason) Min A to initiate RUE movement across midline to initiate supine > L sidelying (logroll technique). Min cueing for RLE attention, but pt able to advance RLE toward EOB without hands-on assist from PT.   Sit to Supine 4: Min assist   Sit to Supine - Details (indicate cue type and reason) Mod multimodal cueing to utilize LLE to manage RLE; pt required Min A for RLE management only. Cueing required for pt to initiate L sidelying > sit via R cervical rotation, bringing LUE across midline.   Transfers   Transfers Sit to Stand;Stand to Sit   Sit to Stand 4: Min assist;With upper extremity assist;From chair/3-in-1;3: Mod assist;Other (comment)  Mod A from low mat table   Sit to Stand Details Tactile cues for placement;Tactile cues for weight shifting;Tactile cues for initiation   Sit to Stand Details (indicate cue type and reason) cueing for RLE placement   Stand to Sit 4: Min assist   Stand to Sit Details (indicate cue type and reason) Verbal cues for technique;Tactile cues for weight beaing;Tactile cues for weight shifting   Stand to Sit Details verbal/tactile cueing for full anterior weight shift  Stand Pivot Transfers 3: Mod assist   Stand Pivot Transfer Details (indicate cue type and reason) manual blocking to prevent R knee buckling into flexion; tactile cueing for M/L weight shift during pivoting   Squat Pivot Transfers 4: Min assist   Squat Pivot Transfer Details (indicate cue type and reason) from low mat table > w/c to patient's L side.   Ambulation/Gait   Ambulation/Gait Yes   Ambulation/Gait Assistance 3: Mod assist   Ambulation/Gait Assistance Details Using R PFRW, pt ambulated x6' with rehab tech providing min guard-min A for stability/balance, PT manually blocking R knee during R stance to prevent buckling into flexion and manually redirecting RLE step to prevent excessive R hip adduction and external rotation during RLE advancement.    Ambulation Distance (Feet) 6 Feet   Assistive device Right platform walker   Gait Pattern Decreased hip/knee flexion - right;Poor foot clearance - right;Scissoring;Decreased weight shift to right;Decreased dorsiflexion - right;Decreased stride length;Decreased step length - left;Right flexed knee in stance  excessive R hip adduction/ER during R step   Ambulation Surface Level;Indoor   Posture/Postural Control   Posture/Postural Control Postural limitations   Postural Limitations Rounded Shoulders;Posterior pelvic tilt;Weight shift right        Postural Assessment Scale for Stroke Patients (PASS)  Give the subject instructions for each item as written below. When scoring the item, record the lowest response category that applies for each item.  Maintaining a Posture  _3_ 1. Sitting Without Support Instructions: Have the subject sit on a bench/mat without back support and with feet flat on the floor. (3) Can sit for 5 minutes without support (2) Can sit for more than 10 seconds without support (1) Can sit with slight support (for example, by 1 hand) (0) Cannot sit  _1_ 2. Standing With Support Instructions: Have the subject stand, providing support as needed. Evaluate only the ability to stand with or without support. Do not consider the quality of the stance. (3) Can stand with support of only 1 hand (2) Can stand with moderate support of 1 person (1) Can stand with strong support of 2 people (0) Cannot stand, even with support  _0_ 3. Standing Without Support Instructions: Have the subject stand without support. Evaluate only the ability to stand with or without support. Do not consider the quality of the stance. (3) Can stand without support for more than 1 minute and simultaneously perform arm movements at about shoulder level (2) Can stand without support for 1 minute or stands slightly asymmetrically  (1) Can stand without support for 10 seconds or leans heavily on 1  leg (0) Cannot stand without support  _1_ 4. Standing on Nonparetic Leg Instructions: Have the subject stand on the nonparetic leg. Evaluate only the ability to bear weight entirely on the nonparetic leg. Do not consider how the subject accomplishes the task. (3) Can stand on nonparetic leg for more than 10 seconds (2) Can stand on nonparetic leg for more than 5 seconds (1) Can stand on nonparetic leg for a few seconds (0) Cannot stand on nonparetic leg  _0_ 5. Standing on Paretic Leg Instructions: Have the subject stand on the paretic leg. Evaluate only the ability to bear weight entirely on the paretic leg. Do not consider how the subject accomplishes the task. (3) Can stand on paretic leg for more than 10 seconds (2) Can stand on paretic leg for more than 5 seconds (1) Can stand on paretic leg for a few seconds (0) Cannot stand  on paretic leg  Maintaining Posture SUBTOTAL ___5_____  Changing a Posture  _3_ 6. Supine to Paretic Side Lateral Instructions: Begin with the subject in supine on a treatment mat. Instruct the subject to roll to the paretic side (lateral movement). Assist as necessary. Evaluate the subject's performance on the amount of help required. Do not consider the quality of performance. (3) Can perform without help (2) Can perform with little help (1) Can perform with much help (0) Cannot perform  _2_ 7. Supine to Nonparetic Side Lateral Instructions: Begin with the subject in supine on a treatment mat. Instruct the subject to roll to the nonparetic side (lateral movement). Assist as necessary. Evaluate the subject's performance on the amount of help required. Do not consider the quality of performance. (3) Can perform without help (2) Can perform with little help (1) Can perform with much help (0) Cannot perform  _2_ 8. Supine to Sitting Up on the Edge of the Mat Instructions: Begin with the subject in supine on a treatment mat. Instruct the subject to come to  sitting on the edge of the mat. Assist as necessary. Evaluate the subject's performance on the amount of help required. Do not consider the quality of performance. (3) Can perform without help (2) Can perform with little help (1) Can perform with much help (0) Cannot perform  _2_ 9. Sitting on the Edge of the Mat to Supine Instructions: Begin with the on the edge of a treatment mat. Instruct the subject to return to supine. Assist as necessary. Evaluate the subject's performance on the amount of help required. Do not consider the quality of performance. (3) Can perform without help (2) Can perform with little help (1) Can perform with much help (0) Cannot perform  _2_ 10. Sitting to Standing Up Instructions: Begin with the subject sitting on the edge of a treatment mat. Instruct the subject to stand up without support. Assist if necessary. Evaluate the subject's performance on the amount of help required. Do not consider the quality of performance. (3) Can perform without help (2) Can perform with little help (1) Can perform with much help (0) Cannot perform  _2_ 11. Standing Up to Sitting Down Instructions: Begin with the subject standing by edge of a treatment mat. Instruct the subject to sit on edge of mat without support. Assist if necessary. Evaluate the subject's performance on the amount of help required. Do not consider the quality of performance. (3) Can perform without help (2) Can perform with little help (1) Can perform with much help (0) Cannot perform  _0_ 12. Standing, Picking Up a Pencil from the Floor Instructions: Begin with the subject standing. Instruct the subject to pick up a pencil fro the floor without support. Assist if necessary. Evaluate the subject's performance on the amount of help required. Do not consider the quality of performance. (3) Can perform without help (2) Can perform with little help (1) Can perform with much help (0) Cannot  perform  Changing Posture SUBTOTAL __13___   TOTAL __18/36___            PT Short Term Goals - 06/28/15 1804    PT SHORT TERM GOAL #1   Title Pt will demonstrate consistent carryover of logroll technique for supine <> sit for R-side WB, increased efficiency of movement, and increased independence with bed mobility.   (Target date: 05/29/15)   Baseline Met 6/2.   Status Achieved   PT SHORT TERM GOAL #2   Title Pt will perform  supine <> sit with supervision, min cueing to indicate increased independence getting into/out of bed.   Baseline 6/2: Min A required.   Status Partially Met   PT SHORT TERM GOAL #3   Title Pt will perform level transfers from w/c <> mat table in B directions with min A, min cueing to indicate increased independence with transfers.   Baseline 6/2: squat pivot min A to L, mod A to R. Requires mod A for stand pivot due to R knee instability.  Will continue as LTG.   Status Partially Met   PT SHORT TERM GOAL #4   Title Pt will perform sit <> stand from w/c with min A, min cueing with LRAD to indicate increased pt independence with functional transfers.   Baseline Met 6/2.   Status Achieved   PT SHORT TERM GOAL #5   Title Pt will perform dynamic standing task x2 consecutive minutes with single UE support and min to indicate improved postural awareness/control with functional standing.   Baseline 6/2: Deferred until R AFO received due to R knee instability in static/dynamic standing.    Status Deferred   PT SHORT TERM GOAL #6   Title ATP will assess current w/c to assist in determining if pt appropriate candidate for custom w/c to improve positioning, preserve skin integrity, and increase pt independence with mobility.    Baseline Met 4/25.   Status Achieved   PT SHORT TERM GOAL #7   Title Pt will improve PASS score from 12/36 to 15/36 to indicate improved independence iwth funcitonal mobility.   Baseline 6/2: PASS score = 18/36.   Status Achieved            PT Long Term Goals - 06/28/15 1807    PT LONG TERM GOAL #1   Title Pt will consistently perform supine <> sit with supervision, min cueing to indicate increased independence getting into/out of bed.  (Modified target date: 07/26/15)   Baseline 6/2: REVISED from mod I to supervision, cueing due to R inattention.  Continue goal through renewed POC.   Status Revised   PT LONG TERM GOAL #2   Title Pt will perform transfers from w/c <> mat table in B directions with CGA to indicate increased independence with transfers. (Modified target date: 07/26/15)   Baseline 6/2: squat pivot min A to L, mod A to R. Requires mod A for stand pivot due to R knee instability.   REVISED from supervision to CGA due to R knee instability.  Continue goal through renewed POC.   Status Partially Met   PT LONG TERM GOAL #3   Title Pt will perform unlevel transfers in B directions with min A, min cueing for increased safety/independence with functional transfers.   Baseline 6/2: Deferred; LTG #2 revised to focus on all transfers.   Status Deferred   PT LONG TERM GOAL #4   Title Pt will perform sit <> stand from w/c with supervision using LRAD to indicate increased pt independence with functional   (Modified target date: 07/26/15)   Baseline 06/25/15: pt is inconsistent in assistance needed, ranging from min guard assist to mod assist.  6/2: REVISED to add LRAD.  Continue goal through renewed POC.   Status Not Met   PT LONG TERM GOAL #5   Title Pt will improve PASS score from 12/36 to 18/36 to indicate significantly improved independence with functional mobility.   Baseline Achieved initial PASS goal for 18/36. Defer revised goal (for score of 22/36) due to  safety concerns with dynamic standing until R AFO received.   Status Achieved   Additional Long Term Goals   Additional Long Term Goals Yes   PT LONG TERM GOAL #6   Title Will attempt ambulation and set goal, if appropriate, to assess if safe/functional to address  short-distance household ambulation.   Baseline 06/25/15: gait initiated in parallel bars for a least 2 sessions now. pt does need assistance at this time. ? need for orthotic on right leg due to knee buckling.. PT to further assess and set goals.   Status Achieved   PT LONG TERM GOAL #7   Title Orthotist will assess patient appropriateness for R AFO to promote R knee stability during transfers and gait.  (Target date: 07/26/15)   Status New   PT LONG TERM GOAL #8   Title Pt will ambulate x15' with mod A using LRAD to initiate short-distance household ambulation, progress toward PLOF.   (Target date: 07/26/15)   Status New               Plan - 06/28/15 1820    Clinical Impression Statement Since beginning this episode of PT, pt has demonstrated improvement in stability/independence with bed mobility, functional transfers, postural stability/control in standing, and has initiated gait training. PASS score has improved from 12 to 18/36. Attendance, compliance, and participation during PT sessions have improved markedly since evaluation. Therefore, pt will continue to benefit from skilled outpatient PT 2x/week for 4 additional weeks to continue to address bed mobility, functional transfers, postural stability/control in standing, and to initiate very short-distance gait training, as appropriate. Long term goals modified accordingly. Pt in full agreement.    Rehab Potential Good   Clinical Impairments Affecting Rehab Potential cognitive impairments; limited family support; relies on friend for transportation   PT Frequency 2x / week   PT Duration 4 weeks   PT Treatment/Interventions ADLs/Self Care Home Management;DME Instruction;Electrical Stimulation;Vestibular;Gait training;Neuromuscular re-education;Stair training;Functional mobility training;Therapeutic activities;Patient/family education;Therapeutic exercise;Balance training;Prosthetic Training;Orthotic Fit/Training;Wheelchair mobility  training   Recommended Other Services 6/2: spoke with orthotist, Marcello Moores, re: possible R GRAFO. Benjie Karvonen will follow up week of 6/5 (need to verify with [employer] that pt didn't receive AFO at hospital).   Consulted and Agree with Plan of Care Patient      Patient will benefit from skilled therapeutic intervention in order to improve the following deficits and impairments:  Postural dysfunction, Impaired tone, Abnormal gait, Decreased balance, Decreased activity tolerance, Decreased endurance, Decreased coordination, Decreased mobility, Impaired perceived functional ability, Impaired sensation, Decreased knowledge of use of DME, Decreased strength, Increased edema, Impaired vision/preception, Decreased cognition  Visit Diagnosis: Spastic hemiplegia affecting right dominant side ([place]) - Plan: PT plan of care cert/re-cert  Other abnormalities of gait and mobility - Plan: PT plan of care cert/re-cert  Abnormal posture - Plan: PT plan of care cert/re-cert  Other disturbances of skin sensation - Plan: PT plan of care cert/re-cert     Problem List Patient Active Problem List   Diagnosis Date Noted  . Absolute anemia   . Thrombocytopenia ([place])   . 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 AKI (acute kidney injury) ([place])   . Dysarthria   . Lethargy   . DM type 2 with diabetic peripheral neuropathy ([place])   . Labile blood pressure   . Hyperlipidemia   . History of CVA with residual deficit   . Chronic obstructive pulmonary disease ([place])   . Hemiparesis, aphasia, and dysphagia as late effect of cerebrovascular accident (  CVA) ([place])   . Acute ischemic stroke ([place])   . CVA (cerebral vascular accident) ([place]) 01/14/2015  . CVA (cerebral infarction) 01/14/2015  . Right hemiplegia ([place])   . Right sided weakness 01/13/2015  . Abdominal pain 06/20/2014  . Altered mental status 02/08/2012  . Sleep apnea 12/08/2010  . Morbid obesity ([place]) 12/08/2010  . CHF (congestive heart failure) ([place])  12/08/2010  . Chronic renal failure 12/08/2010  . TIA (transient ischemic attack) 12/08/2010  . CAD (coronary artery disease) 12/08/2010  . Cellulitis of pubic region 12/01/2010  . Uncontrolled type 2 DM with hyperosmolar nonketotic hyperglycemia ([place]) 12/01/2010   Billie Ruddy, PT, DPT [facility] [address] [place], [place], [address] Phone: [phone]   Fax:  [phone] 06/28/2015, 6:31 PM  Name: AIRA SALLADE MRN: [id number] Date of Birth: 06-19-1950

## 2015-07-01 ENCOUNTER — Ambulatory Visit (INDEPENDENT_AMBULATORY_CARE_PROVIDER_SITE_OTHER): Payer: Medicare Other | Admitting: Neurology

## 2015-07-01 ENCOUNTER — Encounter: Payer: Self-pay | Admitting: Neurology

## 2015-07-01 VITALS — BP 174/91 | HR 84 | Ht 65.0 in

## 2015-07-01 DIAGNOSIS — G8110 Spastic hemiplegia affecting unspecified side: Secondary | ICD-10-CM | POA: Diagnosis not present

## 2015-07-01 MED ORDER — BACLOFEN 10 MG PO TABS: 10.0000 mg | ORAL_TABLET | Freq: Three times a day (TID) | ORAL | Status: DC

## 2015-07-01 NOTE — Patient Instructions (Signed)
I had a long d/w patient and her friend about her recent stroke & TIA, risk for recurrent stroke/TIAs, personally independently reviewed imaging studies and stroke evaluation results and answered questions.Continue aspirin 81 mg daily and clopidogrel 75 mg daily  for secondary stroke prevention and maintain strict control of hypertension with blood pressure goal below 130/90, diabetes with hemoglobin A1c goal below 6.5% and lipids with LDL cholesterol goal below 70 mg/dL. I also advised the patient to eat a healthy diet with plenty of whole grains, cereals, fruits and vegetables, exercise regularly and maintain ideal body weight. Check polysomnogram for sleep apnea. Follow up with primary physician Dr. Cherlynn June for medical issues. Trial of baclofen for spasticity and pain. Start 10 mg twice daily for 1 week and then 3 times daily and increase as tolerated. Followup in the future with me only as needed Stroke Prevention Some medical conditions and behaviors are associated with an increased chance of having a stroke. You may prevent a stroke by making healthy choices and managing medical conditions. HOW CAN I REDUCE MY RISK OF HAVING A STROKE?   Stay physically active. Get at least 30 minutes of activity on most or all days.  Do not smoke. It may also be helpful to avoid exposure to secondhand smoke.  Limit alcohol use. Moderate alcohol use is considered to be:  No more than 2 drinks per day for men.  No more than 1 drink per day for nonpregnant women.  Eat healthy foods. This involves:  Eating 5 or more servings of fruits and vegetables a day.  Making dietary changes that address high blood pressure (hypertension), high cholesterol, diabetes, or obesity.  Manage your cholesterol levels.  Making food choices that are high in fiber and low in saturated fat, trans fat, and cholesterol may control cholesterol levels.  Take any prescribed medicines to control cholesterol as directed by your health  care provider.  Manage your diabetes.  Controlling your carbohydrate and sugar intake is recommended to manage diabetes.  Take any prescribed medicines to control diabetes as directed by your health care provider.  Control your hypertension.  Making food choices that are low in salt (sodium), saturated fat, trans fat, and cholesterol is recommended to manage hypertension.  Ask your health care provider if you need treatment to lower your blood pressure. Take any prescribed medicines to control hypertension as directed by your health care provider.  If you are 59-27 years of age, have your blood pressure checked every 3-5 years. If you are 62 years of age or older, have your blood pressure checked every year.  Maintain a healthy weight.  Reducing calorie intake and making food choices that are low in sodium, saturated fat, trans fat, and cholesterol are recommended to manage weight.  Stop drug abuse.  Avoid taking birth control pills.  Talk to your health care provider about the risks of taking birth control pills if you are over 50 years old, smoke, get migraines, or have ever had a blood clot.  Get evaluated for sleep disorders (sleep apnea).  Talk to your health care provider about getting a sleep evaluation if you snore a lot or have excessive sleepiness.  Take medicines only as directed by your health care provider.  For some people, aspirin or blood thinners (anticoagulants) are helpful in reducing the risk of forming abnormal blood clots that can lead to stroke. If you have the irregular heart rhythm of atrial fibrillation, you should be on a blood thinner unless there is  a good reason you cannot take them.  Understand all your medicine instructions.  Make sure that other conditions (such as anemia or atherosclerosis) are addressed. SEEK IMMEDIATE MEDICAL CARE IF:   You have sudden weakness or numbness of the face, arm, or leg, especially on one side of the body.  Your  face or eyelid droops to one side.  You have sudden confusion.  You have trouble speaking (aphasia) or understanding.  You have sudden trouble seeing in one or both eyes.  You have sudden trouble walking.  You have dizziness.  You have a loss of balance or coordination.  You have a sudden, severe headache with no known cause.  You have new chest pain or an irregular heartbeat. Any of these symptoms may represent a serious problem that is an emergency. Do not wait to see if the symptoms will go away. Get medical help at once. Call your local emergency services (911 in [place] Do not drive yourself to the hospital.   This information is not intended to replace advice given to you by your health care provider. Make sure you discuss any questions you have with your health care provider.   Document Released: 02/20/2004 Document Revised: 02/02/2014 Document Reviewed: 07/15/2012 Elsevier Interactive Patient Education [employer].

## 2015-07-01 NOTE — Progress Notes (Signed)
[facility] [address] [place]. [place] [address] [phone]       OFFICE FOLLOW-UP NOTE  [name] Date of Birth:  1950/11/29 Medical Record Number:  [id number]   HPI: [name] is a 65 year old African-American lady seen today for first office follow-up visit for in-hospital admission for stroke in December 2016 and TIA in April 2017. She is accompanied by her female friend. Michelle Holmes is an 65 y.o. female with history of diabetes mellitus, hypertension, hypercholesterolemia, COPD, previous stroke, and coronary artery disease, presenting with exacerbation of weakness involving her right side starting at 10:30 PM last night. Patient has also had abated blood sugar, including a level of 504 in the emergency room today. His been taking aspirin and Plavix daily. CT scan of her head acute changes. Frontal encephalomalacia from previous stroke was noted. NIH stroke score was 7. LSN: 10:30 PM on 01/12/2015 tPA Given: No: Beyond time under for treatment consideration. MRI scan of the brain showed acute subcortical infarcts as well as old infarcts. She has significant spastic right hemiplegia. She has not been any improvement with therapy. She is currently living at home. She is getting outpatient therapy but is unable to get up and walk even with the help of therapist. She's able to move the right leg from the hip  a bit but has no strength in the right hand. She has pain and spasticity even with passive movements in the right hand. She was briefly readmitted in April 2017 with slurred speech and increased weakness. MRI scan of the brain was obtained which I have reviewed and does not show an acute infarct. LDL cholesterol 65 g percent. Hemoglobin 1107.6. MRI of the brain showed no acute occlusion. Echo and Dopplers were not repeated as they were done in December 2016 and unremarkable. She is on aspirin and Plavix and having minor bruising but no bleeding episodes. She does know  her and is overweight but has never been evaluated for sleep apnea.  ROS:   14 system review of systems is positive for  eye discharge, insomnia, walking difficulty, itching and all the systems negative  PMH:  Past Medical History  Diagnosis Date  . Emphysema   . Colon cancer ([place])   . Hypertension   . Hypercholesteremia   . Diabetes mellitus   . COPD (chronic obstructive pulmonary disease) ([place])   . Coronary artery disease   . Obesity   . Sleep apnea   . TIA (transient ischemic attack)   . CHF (congestive heart failure) ([place])   . Anemia   . DJD (degenerative joint disease)   . Renal insufficiency   . Chronic back pain   . Scoliosis   . Obesity hypoventilation syndrome ([place])   . Stroke ([place])   . Myocardial infarction [facility])     Social History:  Social History   Social History  . Marital Status: Widowed    Spouse Name: N/A  . Number of Children: N/A  . Years of Education: N/A   Occupational History  . Not on file.   Social History Main Topics  . Smoking status: Former Research scientist (life sciences)  . Smokeless tobacco: Not on file  . Alcohol Use: No  . Drug Use: No  . Sexual Activity: Not on file   Other Topics Concern  . Not on file   Social History Narrative    Medications:   Current Outpatient Prescriptions on File Prior to Visit  Medication Sig Dispense Refill  . ADVAIR  DISKUS 250-50 MCG/DOSE AEPB Inhale 1 puff into the lungs 2 (two) times daily.     Marland Kitchen amLODipine (NORVASC) 2.5 MG tablet Take 1 tablet (2.5 mg total) by mouth daily. 30 tablet 3  . aspirin EC 81 MG EC tablet Take 1 tablet (81 mg total) by mouth daily. (Patient taking differently: Take 81 mg by mouth at bedtime. ) 30 tablet 3  . atorvastatin (LIPITOR) 40 MG tablet Take 1 tablet (40 mg total) by mouth daily. (Patient taking differently: Take 40 mg by mouth daily at 6 PM. ) 30 tablet 3  . clopidogrel (PLAVIX) 75 MG tablet Take 1 tablet (75 mg total) by mouth daily. (Patient taking differently: Take 75 mg by mouth at  bedtime. ) 30 tablet 3  . fenofibrate 54 MG tablet Take 1 tablet (54 mg total) by mouth daily. (Patient taking differently: Take 54 mg by mouth at bedtime. ) 30 tablet 1  . ferrous sulfate 325 (65 FE) MG tablet Take 1 tablet (325 mg total) by mouth 2 (two) times daily with a meal. 60 tablet 3  . furosemide (LASIX) 40 MG tablet Take 1 tablet (40 mg total) by mouth 2 (two) times daily. 60 tablet 0  . glipiZIDE (GLUCOTROL) 5 MG tablet Take 1 tablet (5 mg total) by mouth daily before breakfast. 30 tablet 1  . insulin glargine (LANTUS) 100 UNIT/ML injection Inject 0.3 mLs (30 Units total) into the skin at bedtime. (Patient taking differently: Inject 60 Units into the skin at bedtime. ) 10 mL 11  . LYRICA 50 MG capsule Take 1 capsule (50 mg total) by mouth 2 (two) times daily. 60 capsule 3  . Oxycodone HCl 20 MG TABS Take 1 tablet by mouth 3 (three) times daily.  0  . oxyCODONE-acetaminophen (PERCOCET/ROXICET) 5-325 MG tablet Take 1 tablet by mouth as needed. For break through pain.  0   No current facility-administered medications on file prior to visit.    Allergies:   Allergies  Allergen Reactions  . Penicillins Hives and Swelling    Has patient had a PCN reaction causing immediate rash, facial/tongue/throat swelling, SOB or lightheadedness with hypotension: yes Has patient had a PCN reaction causing severe rash involving mucus membranes or skin necrosis: no Has patient had a PCN reaction that required hospitalization no Has patient had a PCN reaction occurring within the last 10 years: no If all of the above answers are "NO", then may proceed with Cephalosporin use.     Physical Exam General: Obese middle-aged African-American lady, seated, in no evident distress Head: head normocephalic and atraumatic.  Neck: supple with no carotid or supraclavicular bruits Cardiovascular: regular rate and rhythm, no murmurs Musculoskeletal: no deformity. Right foot drop. Fixed flexion contracture of the  right wrist and fingers Skin:  no rash/petichiae Vascular:  Normal pulses all extremities Filed Vitals:   07/01/15 1554  BP: 174/91  Pulse: 84   Neurologic Exam Mental Status: Awake and fully alert. Oriented to place and time. Recent and remote memory intact. Attention span, concentration and fund of knowledge appropriate. Mood and affect appropriate. Mild dysarthria and occasional word finding difficulty. Cranial Nerves: Fundoscopic exam reveals sharp disc margins. Pupils equal, briskly reactive to light. Extraocular movements full without nystagmus. Visual fields full to confrontation. Hearing intact. Facial sensation intact. Face, tongue, palate moves normally and symmetrically.  Motor: Spastic right hemiplegia with 0/5 right upper extremity strength. Able to move the right hip against gravity but no stent in the right knee or ankle.  Right foot drop. Sensory.: intact to touch ,pinprick .position and vibratory sensation.  Coordination: Rapid alternating movements normal in all extremities. Finger-to-nose and heel-to-shin performed accurately bilaterally. Gait and Station: Arises from chair without difficulty. Stance is normal. Gait demonstrates normal stride length and balance . Able to heel, toe and tandem walk without difficulty.  Reflexes: 1+ and symmetric. Toes downgoing.   NIHSS  7   Modified Rankin  4  Lipid Panel:     Component Value Date/Time   CHOL 136 05/06/2015 0831   TRIG 228* 05/06/2015 0831   HDL 25* 05/06/2015 0831   CHOLHDL 5.4 05/06/2015 0831   VLDL 46* 05/06/2015 0831   LDLCALC 65 05/06/2015 0831   HgbA1c:  Lab Results  Component Value Date   HGBA1C 7.6* 05/06/2015   Urine Drug Screen:     Component Value Date/Time   LABOPIA NONE DETECTED 05/06/2015 0130   COCAINSCRNUR NONE DETECTED 05/06/2015 0130   LABBENZ NONE DETECTED 05/06/2015 0130   AMPHETMU NONE DETECTED 05/06/2015 0130   THCU NONE DETECTED 05/06/2015 0130   LABBARB NONE DETECTED 05/06/2015 0130       IMAGING I have personally reviewed the radiological images below and agree with the radiology interpretations.  Ct Head Wo Contrast  05/05/2015  IMPRESSION: 1. No acute intracranial pathology seen on CT. 2. Mild cortical volume loss and scattered small vessel ischemic microangiopathy. 3. Small chronic infarct at the left frontoparietal region, with associated encephalomalacia. 4. Small chronic lacunar infarcts at the left basal ganglia and left thalamus.   Mr Brain Wo Contrast  05/06/2015  IMPRESSION: No acute intracranial finding. Extensive old ischemic changes throughout the brain as outlined above. Intracranial MR angiography does not show any large vessel occlusion. Moderate atherosclerotic change of the more distal branch vessels in the anterior circulation. Severe atherosclerotic disease affecting both posterior cerebral arteries, including a severe stenosis at the P1 segment on the right.   2D echo 01/14/15 - - Left ventricle: The cavity size was normal. Wall thickness was  increased in a pattern of moderate LVH. Systolic function was  normal. The estimated ejection fraction was in the range of 55%  to 60%. Wall motion was normal; there were no regional wall  motion abnormalities. Doppler parameters are consistent with  abnormal left ventricular relaxation (grade 1 diastolic  dysfunction). - Atrial septum: No defect or patent foramen ovale was identified.  CUS 01/14/15 - No significant plaque or stenosis at either carotid bifurcations. Bilateral vertebral artery flow is antegrade.   PHYSICAL EXAM    ASSESSMENT: 5 year african [place] lady with recurrent left brain subcortical infarcts with spastic right hemiplegia in December 2016 from small vessel disease with multiple vascular risk factors of obesity, diabetes, hyperlipidemia, hypertension, cerebrovascular disease and suspected sleep apnea. Recent left brain TIA in April 2017    PLAN: I had a long d/w patient  and her friend about her recent stroke & TIA, risk for recurrent stroke/TIAs, personally independently reviewed imaging studies and stroke evaluation results and answered questions.Continue aspirin 81 mg daily and clopidogrel 75 mg daily  for secondary stroke prevention and maintain strict control of hypertension with blood pressure goal below 130/90, diabetes with hemoglobin A1c goal below 6.5% and lipids with LDL cholesterol goal below 70 mg/dL. I also advised the patient to eat a healthy diet with plenty of whole grains, cereals, fruits and vegetables, exercise regularly and maintain ideal body weight. Check polysomnogram for sleep apnea. Follow up with primary physician Dr. Cherlynn June for medical issues.  Trial of baclofen for spasticity and pain. Start 10 mg twice daily for 1 week and then 3 times daily and increase as tolerated. Followup in the future with me only as needed Greater than 50% of time during this 25 minute visit was spent on counseling,explanation of diagnosis, planning of further management, discussion with patient and family and coordination of care Antony Contras, MD  [facility] [address] [place], [place] [address]  Phone [phone] Fax [phone] Note: This document was prepared with digital dictation and possible smart phrase technology. Any transcriptional errors that result from this process are unintentional

## 2015-07-04 ENCOUNTER — Inpatient Hospital Stay (HOSPITAL_COMMUNITY): Payer: Medicare Other

## 2015-07-04 ENCOUNTER — Emergency Department (HOSPITAL_COMMUNITY): Payer: Medicare Other

## 2015-07-04 ENCOUNTER — Encounter: Payer: Self-pay | Admitting: Physical Therapy

## 2015-07-04 ENCOUNTER — Encounter: Payer: Self-pay | Admitting: Occupational Therapy

## 2015-07-04 ENCOUNTER — Ambulatory Visit: Payer: 59 | Admitting: Physical Therapy

## 2015-07-04 ENCOUNTER — Ambulatory Visit: Payer: 59 | Admitting: Occupational Therapy

## 2015-07-04 ENCOUNTER — Inpatient Hospital Stay (HOSPITAL_COMMUNITY)
Admission: EM | Admit: 2015-07-04 | Discharge: 2015-07-07 | DRG: 064 | Disposition: A | Discharge status: Home / Self Care | Payer: Medicare Other | Attending: Internal Medicine | Admitting: Internal Medicine

## 2015-07-04 ENCOUNTER — Encounter (HOSPITAL_COMMUNITY): Payer: Self-pay | Admitting: Internal Medicine

## 2015-07-04 DIAGNOSIS — J449 Chronic obstructive pulmonary disease, unspecified: Secondary | ICD-10-CM | POA: Diagnosis not present

## 2015-07-04 DIAGNOSIS — I5032 Chronic diastolic (congestive) heart failure: Secondary | ICD-10-CM | POA: Diagnosis not present

## 2015-07-04 DIAGNOSIS — Z7982 Long term (current) use of aspirin: Secondary | ICD-10-CM | POA: Diagnosis not present

## 2015-07-04 DIAGNOSIS — M6281 Muscle weakness (generalized): Secondary | ICD-10-CM

## 2015-07-04 DIAGNOSIS — I69351 Hemiplegia and hemiparesis following cerebral infarction affecting right dominant side: Secondary | ICD-10-CM

## 2015-07-04 DIAGNOSIS — R208 Other disturbances of skin sensation: Secondary | ICD-10-CM | POA: Diagnosis not present

## 2015-07-04 DIAGNOSIS — R4781 Slurred speech: Secondary | ICD-10-CM | POA: Diagnosis present

## 2015-07-04 DIAGNOSIS — G9340 Encephalopathy, unspecified: Secondary | ICD-10-CM | POA: Diagnosis not present

## 2015-07-04 DIAGNOSIS — R293 Abnormal posture: Secondary | ICD-10-CM

## 2015-07-04 DIAGNOSIS — E662 Morbid (severe) obesity with alveolar hypoventilation: Secondary | ICD-10-CM | POA: Diagnosis present

## 2015-07-04 DIAGNOSIS — I638 Other cerebral infarction: Secondary | ICD-10-CM | POA: Diagnosis not present

## 2015-07-04 DIAGNOSIS — R55 Syncope and collapse: Secondary | ICD-10-CM | POA: Diagnosis not present

## 2015-07-04 DIAGNOSIS — Z955 Presence of coronary angioplasty implant and graft: Secondary | ICD-10-CM

## 2015-07-04 DIAGNOSIS — I10 Essential (primary) hypertension: Secondary | ICD-10-CM | POA: Diagnosis not present

## 2015-07-04 DIAGNOSIS — R2689 Other abnormalities of gait and mobility: Secondary | ICD-10-CM | POA: Diagnosis not present

## 2015-07-04 DIAGNOSIS — Z85038 Personal history of other malignant neoplasm of large intestine: Secondary | ICD-10-CM

## 2015-07-04 DIAGNOSIS — N184 Chronic kidney disease, stage 4 (severe): Secondary | ICD-10-CM | POA: Diagnosis present

## 2015-07-04 DIAGNOSIS — G459 Transient cerebral ischemic attack, unspecified: Secondary | ICD-10-CM | POA: Diagnosis not present

## 2015-07-04 DIAGNOSIS — G8111 Spastic hemiplegia affecting right dominant side: Secondary | ICD-10-CM

## 2015-07-04 DIAGNOSIS — G9349 Other encephalopathy: Secondary | ICD-10-CM | POA: Diagnosis present

## 2015-07-04 DIAGNOSIS — E785 Hyperlipidemia, unspecified: Secondary | ICD-10-CM | POA: Diagnosis present

## 2015-07-04 DIAGNOSIS — R4182 Altered mental status, unspecified: Secondary | ICD-10-CM | POA: Diagnosis not present

## 2015-07-04 DIAGNOSIS — M1990 Unspecified osteoarthritis, unspecified site: Secondary | ICD-10-CM | POA: Diagnosis present

## 2015-07-04 DIAGNOSIS — E1122 Type 2 diabetes mellitus with diabetic chronic kidney disease: Secondary | ICD-10-CM | POA: Diagnosis present

## 2015-07-04 DIAGNOSIS — T83511A Infection and inflammatory reaction due to indwelling urethral catheter, initial encounter: Secondary | ICD-10-CM

## 2015-07-04 DIAGNOSIS — I639 Cerebral infarction, unspecified: Secondary | ICD-10-CM | POA: Diagnosis not present

## 2015-07-04 DIAGNOSIS — I6789 Other cerebrovascular disease: Secondary | ICD-10-CM | POA: Diagnosis not present

## 2015-07-04 DIAGNOSIS — R531 Weakness: Secondary | ICD-10-CM

## 2015-07-04 DIAGNOSIS — E872 Acidosis: Secondary | ICD-10-CM | POA: Diagnosis not present

## 2015-07-04 DIAGNOSIS — I2510 Atherosclerotic heart disease of native coronary artery without angina pectoris: Secondary | ICD-10-CM | POA: Diagnosis not present

## 2015-07-04 DIAGNOSIS — Z79899 Other long term (current) drug therapy: Secondary | ICD-10-CM

## 2015-07-04 DIAGNOSIS — R278 Other lack of coordination: Secondary | ICD-10-CM | POA: Diagnosis not present

## 2015-07-04 DIAGNOSIS — Z7984 Long term (current) use of oral hypoglycemic drugs: Secondary | ICD-10-CM

## 2015-07-04 DIAGNOSIS — I6930 Unspecified sequelae of cerebral infarction: Secondary | ICD-10-CM

## 2015-07-04 DIAGNOSIS — I130 Hypertensive heart and chronic kidney disease with heart failure and stage 1 through stage 4 chronic kidney disease, or unspecified chronic kidney disease: Secondary | ICD-10-CM | POA: Diagnosis not present

## 2015-07-04 DIAGNOSIS — M79601 Pain in right arm: Secondary | ICD-10-CM

## 2015-07-04 DIAGNOSIS — I252 Old myocardial infarction: Secondary | ICD-10-CM

## 2015-07-04 DIAGNOSIS — I69318 Other symptoms and signs involving cognitive functions following cerebral infarction: Secondary | ICD-10-CM | POA: Diagnosis not present

## 2015-07-04 DIAGNOSIS — N390 Urinary tract infection, site not specified: Secondary | ICD-10-CM | POA: Diagnosis present

## 2015-07-04 DIAGNOSIS — E7800 Pure hypercholesterolemia, unspecified: Secondary | ICD-10-CM | POA: Diagnosis not present

## 2015-07-04 DIAGNOSIS — R41842 Visuospatial deficit: Secondary | ICD-10-CM

## 2015-07-04 DIAGNOSIS — G4733 Obstructive sleep apnea (adult) (pediatric): Secondary | ICD-10-CM | POA: Diagnosis present

## 2015-07-04 DIAGNOSIS — G4730 Sleep apnea, unspecified: Secondary | ICD-10-CM | POA: Diagnosis not present

## 2015-07-04 DIAGNOSIS — Z9119 Patient's noncompliance with other medical treatment and regimen: Secondary | ICD-10-CM | POA: Diagnosis not present

## 2015-07-04 DIAGNOSIS — E1159 Type 2 diabetes mellitus with other circulatory complications: Secondary | ICD-10-CM | POA: Diagnosis not present

## 2015-07-04 DIAGNOSIS — Z7902 Long term (current) use of antithrombotics/antiplatelets: Secondary | ICD-10-CM

## 2015-07-04 DIAGNOSIS — Z87891 Personal history of nicotine dependence: Secondary | ICD-10-CM | POA: Diagnosis not present

## 2015-07-04 DIAGNOSIS — M419 Scoliosis, unspecified: Secondary | ICD-10-CM | POA: Diagnosis not present

## 2015-07-04 DIAGNOSIS — I6623 Occlusion and stenosis of bilateral posterior cerebral arteries: Secondary | ICD-10-CM | POA: Diagnosis not present

## 2015-07-04 DIAGNOSIS — R112 Nausea with vomiting, unspecified: Secondary | ICD-10-CM | POA: Diagnosis not present

## 2015-07-04 DIAGNOSIS — I5022 Chronic systolic (congestive) heart failure: Secondary | ICD-10-CM | POA: Diagnosis not present

## 2015-07-04 DIAGNOSIS — M21371 Foot drop, right foot: Secondary | ICD-10-CM | POA: Diagnosis present

## 2015-07-04 DIAGNOSIS — I6389 Other cerebral infarction: Secondary | ICD-10-CM | POA: Diagnosis present

## 2015-07-04 DIAGNOSIS — M6289 Other specified disorders of muscle: Secondary | ICD-10-CM | POA: Diagnosis not present

## 2015-07-04 DIAGNOSIS — Z794 Long term (current) use of insulin: Secondary | ICD-10-CM

## 2015-07-04 DIAGNOSIS — R001 Bradycardia, unspecified: Secondary | ICD-10-CM | POA: Diagnosis not present

## 2015-07-04 DIAGNOSIS — Z9989 Dependence on other enabling machines and devices: Secondary | ICD-10-CM

## 2015-07-04 LAB — BLOOD GAS, VENOUS
Acid-base deficit: 4.5 mmol/L — ABNORMAL HIGH (ref 0.0–2.0)
Bicarbonate: 21.1 mEq/L (ref 20.0–24.0)
O2 Saturation: 58.2 %
Patient temperature: 98.6
TCO2: 19.9 mmol/L (ref 0–100)
pCO2, Ven: 43.3 mmHg — ABNORMAL LOW (ref 45.0–50.0)
pH, Ven: 7.308 — ABNORMAL HIGH (ref 7.250–7.300)
pO2, Ven: 35.1 mmHg (ref 31.0–45.0)

## 2015-07-04 LAB — CBC WITH DIFFERENTIAL/PLATELET
Basophils Absolute: 0 10*3/uL (ref 0.0–0.1)
Basophils Relative: 0 %
Eosinophils Absolute: 0.1 10*3/uL (ref 0.0–0.7)
Eosinophils Relative: 2 %
HCT: 31.7 % — ABNORMAL LOW (ref 36.0–46.0)
Hemoglobin: 10.8 g/dL — ABNORMAL LOW (ref 12.0–15.0)
Lymphocytes Relative: 27 %
Lymphs Abs: 2.2 10*3/uL (ref 0.7–4.0)
MCH: 30.7 pg (ref 26.0–34.0)
MCHC: 34.1 g/dL (ref 30.0–36.0)
MCV: 90.1 fL (ref 78.0–100.0)
Monocytes Absolute: 0.4 10*3/uL (ref 0.1–1.0)
Monocytes Relative: 5 %
Neutro Abs: 5.2 10*3/uL (ref 1.7–7.7)
Neutrophils Relative %: 66 %
Platelets: 203 10*3/uL (ref 150–400)
RBC: 3.52 MIL/uL — ABNORMAL LOW (ref 3.87–5.11)
RDW: 15.1 % (ref 11.5–15.5)
WBC: 8 10*3/uL (ref 4.0–10.5)

## 2015-07-04 LAB — BLOOD GAS, ARTERIAL
Acid-base deficit: 3.2 mmol/L — ABNORMAL HIGH (ref 0.0–2.0)
Bicarbonate: 21.3 mEq/L (ref 20.0–24.0)
Drawn by: 308601
FIO2: 0.21
O2 Saturation: 93.8 %
Patient temperature: 98.6
TCO2: 19.8 mmol/L (ref 0–100)
pCO2 arterial: 38.4 mmHg (ref 35.0–45.0)
pH, Arterial: 7.363 (ref 7.350–7.450)
pO2, Arterial: 74.4 mmHg — ABNORMAL LOW (ref 80.0–100.0)

## 2015-07-04 LAB — I-STAT CHEM 8, ED
BUN: 41 mg/dL — ABNORMAL HIGH (ref 6–20)
Calcium, Ion: 1.09 mmol/L — ABNORMAL LOW (ref 1.13–1.30)
Chloride: 114 mmol/L — ABNORMAL HIGH (ref 101–111)
Creatinine, Ser: 2.5 mg/dL — ABNORMAL HIGH (ref 0.44–1.00)
Glucose, Bld: 135 mg/dL — ABNORMAL HIGH (ref 65–99)
HCT: 32 % — ABNORMAL LOW (ref 36.0–46.0)
Hemoglobin: 10.9 g/dL — ABNORMAL LOW (ref 12.0–15.0)
Potassium: 4 mmol/L (ref 3.5–5.1)
Sodium: 142 mmol/L (ref 135–145)
TCO2: 17 mmol/L (ref 0–100)

## 2015-07-04 LAB — RAPID URINE DRUG SCREEN, HOSP PERFORMED
Amphetamines: NOT DETECTED
Barbiturates: NOT DETECTED
Benzodiazepines: NOT DETECTED
Cocaine: NOT DETECTED
Opiates: NOT DETECTED
Tetrahydrocannabinol: NOT DETECTED

## 2015-07-04 LAB — CREATININE, SERUM
Creatinine, Ser: 2.51 mg/dL — ABNORMAL HIGH (ref 0.44–1.00)
GFR calc Af Amer: 22 mL/min — ABNORMAL LOW (ref >60)
GFR calc non Af Amer: 19 mL/min — ABNORMAL LOW (ref >60)

## 2015-07-04 LAB — TSH: TSH: 1.784 u[IU]/mL (ref 0.350–4.500)

## 2015-07-04 LAB — URINALYSIS, ROUTINE W REFLEX MICROSCOPIC
Bilirubin Urine: NEGATIVE
Glucose, UA: NEGATIVE mg/dL
Ketones, ur: NEGATIVE mg/dL
Nitrite: NEGATIVE
Protein, ur: NEGATIVE mg/dL
Specific Gravity, Urine: 1.009 (ref 1.005–1.030)
pH: 7 (ref 5.0–8.0)

## 2015-07-04 LAB — COMPREHENSIVE METABOLIC PANEL
ALT: 12 U/L — ABNORMAL LOW (ref 14–54)
AST: 15 U/L (ref 15–41)
Albumin: 4.6 g/dL (ref 3.5–5.0)
Alkaline Phosphatase: 72 U/L (ref 38–126)
Anion gap: 10 (ref 5–15)
BUN: 47 mg/dL — ABNORMAL HIGH (ref 6–20)
CO2: 21 mmol/L — ABNORMAL LOW (ref 22–32)
Calcium: 9.7 mg/dL (ref 8.9–10.3)
Chloride: 112 mmol/L — ABNORMAL HIGH (ref 101–111)
Creatinine, Ser: 2.28 mg/dL — ABNORMAL HIGH (ref 0.44–1.00)
GFR calc Af Amer: 25 mL/min — ABNORMAL LOW (ref >60)
GFR calc non Af Amer: 22 mL/min — ABNORMAL LOW (ref >60)
Glucose, Bld: 139 mg/dL — ABNORMAL HIGH (ref 65–99)
Potassium: 4 mmol/L (ref 3.5–5.1)
Sodium: 143 mmol/L (ref 135–145)
Total Bilirubin: 0.5 mg/dL (ref 0.3–1.2)
Total Protein: 8.1 g/dL (ref 6.5–8.1)

## 2015-07-04 LAB — CBG MONITORING, ED
Glucose-Capillary: 159 mg/dL — ABNORMAL HIGH (ref 65–99)
Glucose-Capillary: 96 mg/dL (ref 65–99)

## 2015-07-04 LAB — TROPONIN I: Troponin I: <0.03 ng/mL (ref <0.031)

## 2015-07-04 LAB — URINE MICROSCOPIC-ADD ON

## 2015-07-04 LAB — CBC
HCT: 32 % — ABNORMAL LOW (ref 36.0–46.0)
Hemoglobin: 11 g/dL — ABNORMAL LOW (ref 12.0–15.0)
MCH: 30.9 pg (ref 26.0–34.0)
MCHC: 34.4 g/dL (ref 30.0–36.0)
MCV: 89.9 fL (ref 78.0–100.0)
Platelets: 186 10*3/uL (ref 150–400)
RBC: 3.56 MIL/uL — ABNORMAL LOW (ref 3.87–5.11)
RDW: 15 % (ref 11.5–15.5)
WBC: 7.6 10*3/uL (ref 4.0–10.5)

## 2015-07-04 LAB — AMMONIA: Ammonia: 29 umol/L (ref 9–35)

## 2015-07-04 LAB — ETHANOL: Alcohol, Ethyl (B): <5 mg/dL (ref <5)

## 2015-07-04 LAB — I-STAT TROPONIN, ED: Troponin i, poc: 0.01 ng/mL (ref 0.00–0.08)

## 2015-07-04 MED ORDER — NALOXONE HCL 0.4 MG/ML IJ SOLN
0.2000 mg | Freq: Once | INTRAMUSCULAR | Status: AC
  Administered 2015-07-04: 0.2 mg via INTRAVENOUS
  Filled 2015-07-04: qty 1

## 2015-07-04 MED ORDER — STROKE: EARLY STAGES OF RECOVERY BOOK
Freq: Once | Status: AC
Start: 2015-07-04 — End: 2015-07-04
  Administered 2015-07-04: 23:00:00
  Filled 2015-07-04: qty 1

## 2015-07-04 MED ORDER — ASPIRIN 300 MG RE SUPP
300.0000 mg | Freq: Every day | RECTAL | Status: DC
  Administered 2015-07-05 (×2): 300 mg via RECTAL
  Filled 2015-07-04 (×3): qty 1

## 2015-07-04 MED ORDER — INSULIN ASPART 100 UNIT/ML ~~LOC~~ SOLN: 0.0000 [IU] | SUBCUTANEOUS | Status: DC

## 2015-07-04 MED ORDER — MOMETASONE FURO-FORMOTEROL FUM 200-5 MCG/ACT IN AERO
2.0000 | INHALATION_SPRAY | Freq: Two times a day (BID) | RESPIRATORY_TRACT | Status: DC
  Administered 2015-07-05: 2 via RESPIRATORY_TRACT
  Filled 2015-07-04 (×2): qty 8.8

## 2015-07-04 MED ORDER — DEXTROSE 5 % IV SOLN
1.0000 g | Freq: Once | INTRAVENOUS | Status: AC
  Administered 2015-07-04: 1 g via INTRAVENOUS
  Filled 2015-07-04: qty 10

## 2015-07-04 MED ORDER — HYDRALAZINE HCL 20 MG/ML IJ SOLN: 10.0000 mg | INTRAMUSCULAR | Status: DC | PRN

## 2015-07-04 MED ORDER — SODIUM CHLORIDE 0.9 % IV SOLN
INTRAVENOUS | Status: DC
  Administered 2015-07-05: 75 mL/h via INTRAVENOUS
  Administered 2015-07-05: 02:00:00 via INTRAVENOUS

## 2015-07-04 MED ORDER — NALOXONE HCL 0.4 MG/ML IJ SOLN
0.4000 mg | Freq: Once | INTRAMUSCULAR | Status: AC
  Administered 2015-07-04: 0.4 mg via INTRAVENOUS
  Filled 2015-07-04: qty 1

## 2015-07-04 MED ORDER — ENOXAPARIN SODIUM 30 MG/0.3ML ~~LOC~~ SOLN
30.0000 mg | Freq: Every day | SUBCUTANEOUS | Status: DC
  Administered 2015-07-05 – 2015-07-06 (×3): 30 mg via SUBCUTANEOUS
  Filled 2015-07-04 (×4): qty 0.3

## 2015-07-04 MED ORDER — DEXTROSE 5 % IV SOLN
1.0000 g | INTRAVENOUS | Status: DC
  Administered 2015-07-05 – 2015-07-06 (×2): 1 g via INTRAVENOUS
  Filled 2015-07-04 (×3): qty 10

## 2015-07-04 MED ORDER — INSULIN GLARGINE 100 UNIT/ML ~~LOC~~ SOLN
5.0000 [IU] | Freq: Every day | SUBCUTANEOUS | Status: DC
  Filled 2015-07-04 (×2): qty 0.05

## 2015-07-04 MED ORDER — ASPIRIN 325 MG PO TABS
325.0000 mg | ORAL_TABLET | Freq: Every day | ORAL | Status: DC
  Administered 2015-07-06 – 2015-07-07 (×2): 325 mg via ORAL
  Filled 2015-07-04 (×2): qty 1

## 2015-07-04 NOTE — ED Notes (Signed)
Bed: [id number] Expected date:  Expected time:  Means of arrival:  Comments: 65 yo vomiting;

## 2015-07-04 NOTE — ED Notes (Signed)
Per [facility]- Pt at [facility] syncopal episode (unknown amount of time) in wheelchair witnessed by family emesis followed. Emesis continued then family agreed for family to transport. Pt has CVA in Feb then NOV. RT sided weakness with slurred speech as baseline. Pt seen by PCP 2 days ago with recent change in BP meds. Upon arrival to ED. Pt with moments of altered mentation lasting 5 minutes however upon transfer to stretcher pt returned to baseline per [facility]. Denies any other complaints. Pt verbalized need to urinate. Pt able to use bed pan.

## 2015-07-04 NOTE — ED Notes (Signed)
Respiratory called

## 2015-07-04 NOTE — Therapy (Signed)
[facility] [address] [place], [place], [address] Phone: [phone]   Fax:  [phone]  Physical Therapy Treatment  Patient Details  Name: Michelle Holmes MRN: [id number] Date of Birth: [birth year] Referring Provider: Alysia Penna, MD  Encounter Date: 07/04/2015      PT End of Session - 07/04/15 0942    Visit Number 14   Number of Visits 21  requesting 8 additional PT visits   Date for PT Re-Evaluation 08/27/15   Authorization Type UHC MCR - G Codes and PN required every 10 visits   PT Start Time 0935   PT Stop Time 1015   PT Time Calculation (min) 40 min   Equipment Utilized During Treatment Gait belt   Activity Tolerance Patient tolerated treatment well   Behavior During Therapy [facility] for tasks assessed/performed      Past Medical History  Diagnosis Date  . Emphysema   . Colon cancer ([place])   . Hypertension   . Hypercholesteremia   . Diabetes mellitus   . COPD (chronic obstructive pulmonary disease) ([place])   . Coronary artery disease   . Obesity   . Sleep apnea   . TIA (transient ischemic attack)   . CHF (congestive heart failure) ([place])   . Anemia   . DJD (degenerative joint disease)   . Renal insufficiency   . Chronic back pain   . Scoliosis   . Obesity hypoventilation syndrome ([place])   . Stroke ([place])   . Myocardial infarction [facility])     Past Surgical History  Procedure Laterality Date  . Coronary angioplasty with stent placement    . Colon surgery      There were no vitals filed for this visit.      Subjective Assessment - 07/04/15 0941    Subjective No new complaints. No falls or pain to report.    Pertinent History PMH significant for: CVA x2 (most recent: 01/13/15), HTN, HLD, CHF, COPD, OSA, CAD, DM II, colon cancer, DJD, scoliosis, chronic renal insufficiency   Patient Stated Goals "I want a new wheelchair and I want to walk."   Currently in Pain? No/denies   Pain Score 0-No pain             [facility] Adult PT Treatment/Exercise - 07/04/15 0949    Transfers   Transfers Sit to Stand;Stand to Sit   Sit to Stand 4: Min assist;3: Mod assist;With upper extremity assist;With armrests;From chair/3-in-1;From bed   Sit to Stand Details Tactile cues for placement;Tactile cues for weight shifting;Tactile cues for initiation   Sit to Stand Details (indicate cue type and reason) cues to scoot forward and for weight shifting. cues for hand placement.   Stand to Sit 4: Min assist;3: Mod assist;With upper extremity assist;With armrests;To bed;To chair/3-in-1;Uncontrolled descent   Stand to Sit Details (indicate cue type and reason) Verbal cues for technique;Tactile cues for weight beaing;Tactile cues for weight shifting   Stand to Sit Details cues to reach back and for weight shifting   Stand Pivot Transfers 3: Mod assist   Stand Pivot Transfer Details (indicate cue type and reason) wheelchair<>mat table with manual blocking of right knee, cues to advance left leg with transfer each way   Number of Reps Other reps (comment);Other sets (comment)  sit<>stand x 2 reps to/from mat table   Comments standing trials next to mat with up to mod assist. right knee blocked with both stands Worked on left UE movements and reaching during 1st stand.  Worked on left leg forward/backward stepping with 2cd stand.                       Ambulation/Gait   Ambulation/Gait Yes   Ambulation/Gait Assistance 3: Mod assist  min to mod assist of 2 people   Ambulation/Gait Assistance Details using right PFRW, 2 person assisst. 1 person for balance and second person for right leg management. Used KI to assist with knee control/buckling. Pt with increased ER at hip and needed assistance for right leg advancement and for step placement due to scissoring.     Ambulation Distance (Feet) 35 Feet   Assistive device Right platform walker  right short knee immobilizer   Gait Pattern Decreased hip/knee flexion - right;Poor  foot clearance - right;Scissoring;Decreased weight shift to right;Decreased dorsiflexion - right;Decreased stride length;Decreased step length - left;Right flexed knee in stance   Ambulation Surface Level;Indoor             PT Short Term Goals - 06/28/15 1804    PT SHORT TERM GOAL #1   Title Pt will demonstrate consistent carryover of logroll technique for supine <> sit for R-side WB, increased efficiency of movement, and increased independence with bed mobility.   (Target date: 05/29/15)   Baseline Met 6/2.   Status Achieved   PT SHORT TERM GOAL #2   Title Pt will perform supine <> sit with supervision, min cueing to indicate increased independence getting into/out of bed.   Baseline 6/2: Min A required.   Status Partially Met   PT SHORT TERM GOAL #3   Title Pt will perform level transfers from w/c <> mat table in B directions with min A, min cueing to indicate increased independence with transfers.   Baseline 6/2: squat pivot min A to L, mod A to R. Requires mod A for stand pivot due to R knee instability.  Will continue as LTG.   Status Partially Met   PT SHORT TERM GOAL #4   Title Pt will perform sit <> stand from w/c with min A, min cueing with LRAD to indicate increased pt independence with functional transfers.   Baseline Met 6/2.   Status Achieved   PT SHORT TERM GOAL #5   Title Pt will perform dynamic standing task x2 consecutive minutes with single UE support and min to indicate improved postural awareness/control with functional standing.   Baseline 6/2: Deferred until R AFO received due to R knee instability in static/dynamic standing.    Status Deferred   PT SHORT TERM GOAL #6   Title ATP will assess current w/c to assist in determining if pt appropriate candidate for custom w/c to improve positioning, preserve skin integrity, and increase pt independence with mobility.    Baseline Met 4/25.   Status Achieved   PT SHORT TERM GOAL #7   Title Pt will improve PASS score  from 12/36 to 15/36 to indicate improved independence iwth funcitonal mobility.   Baseline 6/2: PASS score = 18/36.   Status Achieved           PT Long Term Goals - 07/04/15 1626    PT LONG TERM GOAL #1   Title Pt will consistently perform supine <> sit with supervision, min cueing to indicate increased independence getting into/out of bed.  (Modified target date: 07/26/15)   Baseline 6/2: REVISED from mod I to supervision, cueing due to R inattention.  Continue goal through renewed POC.   Status Revised   PT LONG  TERM GOAL #2   Title Pt will perform transfers from w/c <> mat table in B directions with CGA to indicate increased independence with transfers. (Modified target date: 07/26/15)   Baseline 6/2: squat pivot min A to L, mod A to R. Requires mod A for stand pivot due to R knee instability.   REVISED from supervision to CGA due to R knee instability.  Continue goal through renewed POC.   Status Partially Met   PT LONG TERM GOAL #3   Title Pt will perform unlevel transfers in B directions with min A, min cueing for increased safety/independence with functional transfers.   Baseline 6/2: Deferred; LTG #2 revised to focus on all transfers.   Status Deferred   PT LONG TERM GOAL #4   Title Pt will perform sit <> stand from w/c with supervision using LRAD to indicate increased pt independence with functional   (Modified target date: 07/26/15)   Baseline 06/25/15: pt is inconsistent in assistance needed, ranging from min guard assist to mod assist.  6/2: REVISED to add LRAD.  Continue goal through renewed POC.   Status Not Met   PT LONG TERM GOAL #5   Title Pt will improve PASS score from 12/36 to 18/36 to indicate significantly improved independence with functional mobility.   Baseline Achieved initial PASS goal for 18/36. Defer revised goal (for score of 22/36) due to safety concerns with dynamic standing until R AFO received.   Status Achieved   PT LONG TERM GOAL #6   Title Will  attempt ambulation and set goal, if appropriate, to assess if safe/functional to address short-distance household ambulation.   Baseline 06/25/15: gait initiated in parallel bars for a least 2 sessions now. pt does need assistance at this time. ? need for orthotic on right leg due to knee buckling.. PT to further assess and set goals.   Status Achieved   PT LONG TERM GOAL #7   Title Orthotist will assess patient appropriateness for R AFO to promote R knee stability during transfers and gait.  (Target date: 07/26/15)   Status On-going   PT LONG TERM GOAL #8   Title Pt will ambulate x15' with mod A using LRAD to initiate short-distance household ambulation, progress toward PLOF.   (Target date: 07/26/15)   Status On-going           Plan - 07/04/15 0942    Clinical Impression Statement Today's session addressed transfers and gait with PFRW. Pt with increased gait distance today and less assistance needed for right knee stability with use of KI. Pt does still need assitance to advance right leg and for step placement. Pt is making progress toward goals    Rehab Potential Good   Clinical Impairments Affecting Rehab Potential cognitive impairments; limited family support; relies on friend for transportation   PT Frequency 2x / week   PT Duration 4 weeks   PT Treatment/Interventions ADLs/Self Care Home Management;DME Instruction;Electrical Stimulation;Vestibular;Gait training;Neuromuscular re-education;Stair training;Functional mobility training;Therapeutic activities;Patient/family education;Therapeutic exercise;Balance training;Prosthetic Training;Orthotic Fit/Training;Wheelchair mobility training   PT Next Visit Plan continue gait training/ standing as tolerated   Consulted and Agree with Plan of Care Patient      Patient will benefit from skilled therapeutic intervention in order to improve the following deficits and impairments:  Postural dysfunction, Impaired tone, Abnormal gait, Decreased  balance, Decreased activity tolerance, Decreased endurance, Decreased coordination, Decreased mobility, Impaired perceived functional ability, Impaired sensation, Decreased knowledge of use of DME, Decreased strength, Increased edema, Impaired vision/preception, Decreased  cognition  Visit Diagnosis: Other abnormalities of gait and mobility  Abnormal posture  Spastic hemiplegia affecting right dominant side (HCC)  Other disturbances of skin sensation     Problem List Patient Active Problem List   Diagnosis Date Noted  . Spastic hemiplegia affecting nondominant side ([place]) 07/01/2015  . Obesity, morbid ([place]) 07/01/2015  . Absolute anemia   . Thrombocytopenia ([place])   . Acute ischemic VBA thalamic stroke ([place]) 01/18/2015  . AKI (acute kidney injury) ([place])   . Dysarthria   . Lethargy   . DM type 2 with diabetic peripheral neuropathy ([place])   . Labile blood pressure   . Hyperlipidemia   . History of CVA with residual deficit   . Chronic obstructive pulmonary disease ([place])   . Hemiparesis, aphasia, and dysphagia as late effect of cerebrovascular accident (CVA) ([place])   . Acute ischemic stroke ([place])   . CVA (cerebral vascular accident) ([place]) 01/14/2015  . CVA (cerebral infarction) 01/14/2015  . Right hemiplegia ([place])   . Right sided weakness 01/13/2015  . Abdominal pain 06/20/2014  . Altered mental status 02/08/2012  . Sleep apnea 12/08/2010  . Morbid obesity ([place]) 12/08/2010  . CHF (congestive heart failure) ([place]) 12/08/2010  . Chronic renal failure 12/08/2010  . TIA (transient ischemic attack) 12/08/2010  . CAD (coronary artery disease) 12/08/2010  . Cellulitis of pubic region 12/01/2010  . Uncontrolled type 2 DM with hyperosmolar nonketotic hyperglycemia ([place]) 12/01/2010    Willow Ora, PTA, [facility] [address] [place], [place] [address] [phone] 07/04/2015, 4:37 PM   Name: Michelle Holmes MRN: [id number] Date of Birth:  [birth year]

## 2015-07-04 NOTE — ED Provider Notes (Signed)
CSN: [id number]     Arrival date & time 07/04/15  1442 History   First MD Initiated Contact with Patient 07/04/15 1510     Chief Complaint  Patient presents with  . Loss of Consciousness  . Emesis     Patient is a 65 y.o. female presenting with syncope and vomiting. The history is provided by the EMS personnel. No language interpreter was used.  Loss of Consciousness Associated symptoms: vomiting   Emesis  Michelle Holmes is a 65 y.o. female who presents to the Emergency Department complaining of vomiting, loss of consciousness.  Level V caveat due to altered mental status.  History is provided by EMS. According to reports patient was at the [facility] doing physical therapy from her recent stroke when she syncopized followed by vomiting. She has known right-sided weakness and slurred speech. Per report she was seen by her primary care provider 2 days ago and had her blood pressure medications changed.  Past Medical History  Diagnosis Date  . Emphysema   . Colon cancer ([place])   . Hypertension   . Hypercholesteremia   . Diabetes mellitus   . COPD (chronic obstructive pulmonary disease) ([place])   . Coronary artery disease   . Obesity   . Sleep apnea   . TIA (transient ischemic attack)   . CHF (congestive heart failure) ([place])   . Anemia   . DJD (degenerative joint disease)   . Renal insufficiency   . Chronic back pain   . Scoliosis   . Obesity hypoventilation syndrome ([place])   . Stroke ([place])   . Myocardial infarction [facility])    Past Surgical History  Procedure Laterality Date  . Coronary angioplasty with stent placement    . Colon surgery     Family History  Problem Relation Age of Onset  . Stroke Maternal Aunt    Social History  Substance Use Topics  . Smoking status: Former Research scientist (life sciences)  . Smokeless tobacco: None  . Alcohol Use: No   OB History    No data available     Review of Systems  Unable to perform ROS: Patient unresponsive  Cardiovascular: Positive for syncope.   Gastrointestinal: Positive for vomiting.      Allergies  Penicillins  Home Medications   Prior to Admission medications   Medication Sig Start Date End Date Taking? Authorizing Provider  ADVAIR DISKUS 250-50 MCG/DOSE AEPB Inhale 1 puff into the lungs daily as needed (shortness of breath).  08/22/14  Yes Historical Provider, MD  amLODipine (NORVASC) 5 MG tablet Take 5 mg by mouth at bedtime.   Yes Historical Provider, MD  aspirin EC 81 MG EC tablet Take 1 tablet (81 mg total) by mouth daily. Patient taking differently: Take 81 mg by mouth at bedtime.  06/22/14  Yes Charolette Forward, MD  atorvastatin (LIPITOR) 40 MG tablet Take 1 tablet (40 mg total) by mouth daily. Patient taking differently: Take 40 mg by mouth daily at 6 PM.  02/07/15  Yes Lavon Paganini Angiulli, PA-C  baclofen (LIORESAL) 10 MG tablet Take 1 tablet (10 mg total) by mouth 3 (three) times daily. Start 1 tablet twice daily x 1 week then three times daily 07/01/15  Yes Garvin Fila, MD  clopidogrel (PLAVIX) 75 MG tablet Take 1 tablet (75 mg total) by mouth daily. 02/07/15  Yes Daniel J Angiulli, PA-C  cyclobenzaprine (FLEXERIL) 10 MG tablet Take 10 mg by mouth 2 (two) times daily as needed (pain).   Yes Historical Provider, MD  fenofibrate 54 MG tablet Take 1 tablet (54 mg total) by mouth daily. 02/07/15  Yes Daniel J Angiulli, PA-C  ferrous sulfate 325 (65 FE) MG tablet Take 1 tablet (325 mg total) by mouth 2 (two) times daily with a meal. 02/07/15  Yes Daniel J Angiulli, PA-C  glipiZIDE (GLUCOTROL) 5 MG tablet Take 1 tablet (5 mg total) by mouth daily before breakfast. 02/07/15  Yes Daniel J Angiulli, PA-C  Insulin Glargine (LANTUS SOLOSTAR) 100 UNIT/ML Solostar Pen Inject 30 Units into the skin at bedtime.   Yes Historical Provider, MD  LYRICA 50 MG capsule Take 1 capsule (50 mg total) by mouth 2 (two) times daily. [id number]  Yes Delora Fuel, MD  metoprolol succinate (TOPROL-XL) 50 MG 24 hr tablet Take 50 mg by mouth daily. Take with or  immediately following a meal.   Yes Historical Provider, MD  Oxycodone HCl 20 MG TABS Take 1 tablet by mouth 3 (three) times daily. 06/20/15  Yes Historical Provider, MD  pantoprazole (PROTONIX) 40 MG tablet Take 40 mg by mouth daily.   Yes Historical Provider, MD  amLODipine (NORVASC) 2.5 MG tablet Take 1 tablet (2.5 mg total) by mouth daily. Patient not taking: Reported on 07/04/2015 05/07/15   Charolette Forward, MD  furosemide (LASIX) 20 MG tablet Take 20 mg by mouth daily. Or as directed    Historical Provider, MD  furosemide (LASIX) 40 MG tablet Take 1 tablet (40 mg total) by mouth 2 (two) times daily. Patient taking differently: Take 40 mg by mouth at bedtime.  [id number]   Delora Fuel, MD   BP [id number] mmHg  Pulse 47  Temp(Src) 98.4 F (36.9 C) (Oral)  Resp 8  SpO2 99% Physical Exam  Constitutional: She appears well-developed and well-nourished.  HENT:  Head: Normocephalic and atraumatic.  Eyes:  Pupils pinpoint bilaterally  Cardiovascular: Regular rhythm.   No murmur heard. Bradycardic  Pulmonary/Chest: Effort normal and breath sounds normal. No respiratory distress.  Abdominal: Soft. There is no tenderness. There is no rebound and no guarding.  Musculoskeletal: She exhibits no tenderness.  2+ edema bilateral lower extremities  Neurological:  Lethargic. Grimaces to painful stimuli.  Skin: Skin is warm and dry.  Psychiatric:  Unable to assess  Nursing note and vitals reviewed.   ED Course  Procedures (including critical care time) Labs Review Labs Reviewed  COMPREHENSIVE METABOLIC PANEL - Abnormal; Notable for the following:    Chloride 112 (*)    CO2 21 (*)    Glucose, Bld 139 (*)    BUN 47 (*)    Creatinine, Ser 2.28 (*)    ALT 12 (*)    GFR calc non Af Amer 22 (*)    GFR calc Af Amer 25 (*)    All other components within normal limits  CBC WITH DIFFERENTIAL/PLATELET - Abnormal; Notable for the following:    RBC 3.52 (*)    Hemoglobin 10.8 (*)    HCT 31.7 (*)    All  other components within normal limits  URINALYSIS, ROUTINE W REFLEX MICROSCOPIC (NOT AT [facility]) - Abnormal; Notable for the following:    APPearance CLOUDY (*)    Hgb urine dipstick TRACE (*)    Leukocytes, UA LARGE (*)    All other components within normal limits  BLOOD GAS, VENOUS - Abnormal; Notable for the following:    pH, Ven 7.308 (*)    pCO2, Ven 43.3 (*)    Acid-base deficit 4.5 (*)    All other components within normal  limits  URINE MICROSCOPIC-ADD ON - Abnormal; Notable for the following:    Squamous Epithelial / LPF 0-5 (*)    Bacteria, UA MANY (*)    All other components within normal limits  CBC - Abnormal; Notable for the following:    RBC 3.56 (*)    Hemoglobin 11.0 (*)    HCT 32.0 (*)    All other components within normal limits  CREATININE, SERUM - Abnormal; Notable for the following:    Creatinine, Ser 2.51 (*)    GFR calc non Af Amer 19 (*)    GFR calc Af Amer 22 (*)    All other components within normal limits  BLOOD GAS, ARTERIAL - Abnormal; Notable for the following:    pO2, Arterial 74.4 (*)    Acid-base deficit 3.2 (*)    All other components within normal limits  CBG MONITORING, ED - Abnormal; Notable for the following:    Glucose-Capillary 159 (*)    All other components within normal limits  I-STAT CHEM 8, ED - Abnormal; Notable for the following:    Chloride 114 (*)    BUN 41 (*)    Creatinine, Ser 2.50 (*)    Glucose, Bld 135 (*)    Calcium, Ion 1.09 (*)    Hemoglobin 10.9 (*)    HCT 32.0 (*)    All other components within normal limits  URINE CULTURE  URINE RAPID DRUG SCREEN, HOSP PERFORMED  ETHANOL  TROPONIN I  TSH  AMMONIA  TROPONIN I  TROPONIN I  HEMOGLOBIN A1C  LIPID PANEL  COMPREHENSIVE METABOLIC PANEL  CBC  I-STAT TROPOININ, ED  CBG MONITORING, ED    Imaging Review Ct Head Wo Contrast  07/04/2015  CLINICAL DATA:  Syncopal episode cough personal history of stroke, unresponsive EXAM: CT HEAD WITHOUT CONTRAST TECHNIQUE:  Contiguous axial images were obtained from the base of the skull through the vertex without intravenous contrast. COMPARISON:  05/06/2015 MRI FINDINGS: Mild diffuse cortical atrophy with moderate low attenuation in the deep white matter. Encephalomalacia left frontal parietal region stable from prior MRI. No evidence of acute vascular territory infarct or mass. No parenchymal hemorrhage or extra-axial fluid. Age-related basal ganglia calcification bilaterally. No hydrocephalus. Calvarium intact. IMPRESSION: Chronic ischemic change with no acute findings Electronically Signed   By: Skipper Cliche M.D.   On: 07/04/2015 16:02   Mr Brain Wo Contrast (neuro Protocol)  07/04/2015  CLINICAL DATA:  65 year old hypertensive diabetic female with high cholesterol. History of COPD. Colon cancer. Syncopal episode in wheelchair. Prior infarct with baseline right-sided weakness and slurred speech. Recent change in blood pressure medication. Subsequent encounter. EXAM: MRI HEAD WITHOUT CONTRAST TECHNIQUE: Multiplanar, multiecho pulse sequences of the brain and surrounding structures were obtained without intravenous contrast. COMPARISON:  07/04/2015 head CT.  05/06/2015 brain MR. FINDINGS: Exam is motion degraded.  Fast technique imaging had a be utilized. Tiny acute nonhemorrhagic left periatrial infarct. Remote small to moderate-size anterior left frontal lobe infarct with encephalomalacia. Remote posterior left lenticular nucleus infarct. Moderate chronic microvascular changes. Tiny area blood breakdown products right pons probably related to prior hemorrhagic ischemia. Artifact left supraorbital region. Wallerian degeneration with small left cerebral peduncle. Global atrophy without hydrocephalus. No intracranial mass lesion noted on this unenhanced exam. Ectatic vertebral arteries and basilar artery. Narrowing of the right vertebral artery may be present. Internal carotid arteries are patent. Exophthalmos.  Symmetric normal  extra-ocular muscles. IMPRESSION: Tiny acute nonhemorrhagic left periatrial infarct. Remainder findings appear chronic as detailed above. Electronically Signed  By: Genia Del M.D.   On: 07/04/2015 19:02   Dg Chest Port 1 View  07/04/2015  CLINICAL DATA:  Syncope. EXAM: PORTABLE CHEST 1 VIEW COMPARISON:  January 14, 2015 FINDINGS: Stable cardiomediastinal silhouette. No pneumothorax. No pulmonary nodules, masses, or focal infiltrates. Scoliotic changes in the spine again identified. IMPRESSION: No active disease. Electronically Signed   By: Dorise Bullion III M.D   On: 07/04/2015 15:42   Mr Jodene Nam Head/brain Wo Cm  07/04/2015  CLINICAL DATA:  Initial evaluation for acute stroke. EXAM: MRA HEAD WITHOUT CONTRAST TECHNIQUE: Angiographic images of the Circle of Willis were obtained using MRA technique without intravenous contrast. COMPARISON:  Previous brain MRI and head CT from earlier the same day. Comparison also made with prior MRA from 05/06/2015. FINDINGS: ANTERIOR CIRCULATION: Study degraded by motion artifact. Visualized distal cervical segments of the internal carotid arteries are tortuous but patent with antegrade flow. Petrous, cavernous, and supraclinoid segments are patent without flow limiting stenosis. A1 segments patent. Anterior communicating artery within normal limits. Anterior cerebral arteries well opacified to their distal aspects. Left ACA slightly attenuated as compared to the right, stable. M1 segments patent without stenosis or occlusion. MCA bifurcations normal. Distal small vessel atheromatous irregularity within the MCA branches bilaterally. POSTERIOR CIRCULATION: Vertebral arteries patent to the vertebrobasilar junction without flow-limiting stenosis. Left posterior inferior cerebral artery patent. Right posterior inferior cerebral artery not visualized on this exam. Basilar artery widely patent without basilar tip stenosis. Left superior cerebellar artery patent with atheromatous  irregularity. Right superior cerebral artery is hypoplastic but grossly patent. Both posterior cerebral arteries arise from the basilar artery. There is a severe stenosis at the proximal right PCA. Right PCA is attenuated distally. Severe atheromatous irregularity with stenoses present within the mid left PCA is well. No aneurysm or vascular malformation. IMPRESSION: 1. No large vessel occlusion within the intracranial circulation. 2. Severe atheromatous disease involving the posterior cerebral arteries bilaterally, with severe stenosis at the proximal right PCA, stable from prior. 3. Moderate atheromatous irregularity within the distal branches of the anterior circulation, also stable from prior. Electronically Signed   By: Jeannine Boga M.D.   On: 07/04/2015 23:10   I have personally reviewed and evaluated these images and lab results as part of my medical decision-making.   EKG Interpretation   Date/Time:  Thursday July 04 2015 14:58:04 EDT Ventricular Rate:  51 PR Interval:  183 QRS Duration: 95 QT Interval:  457 QTC Calculation: 421 R Axis:   5 Text Interpretation:  Sinus rhythm Confirmed by Hazle Coca [phone]) on  07/04/2015 3:10:52 PM Also confirmed by Hazle Coca [phone]), editor Yehuda Mao [phone])  on 07/04/2015 3:41:37 PM      MDM   Final diagnoses:  Acute CVA (cerebrovascular accident) [facility])    Patient here for evaluation of change in mental status, vomiting. She has been normally responsive in the emergency department but was noted to be talking just prior to the ED evaluation. On multiple repeat evaluations she continues to be somnolent and only responsive to painful stimuli. Her pupils are pinpoint. After administration of Narcan her pupils become midsized to dilated and reactive bilaterally but there is no significant change in her somnolence. Her new medication appears to be baclofen that was started 2 days ago. Per family she has no known narcotics at home and does not  have a history of drug or alcohol abuse. Family do state that she has a history of noncompliance and frequently will drink Peps[employer]  and eat food that she is not supposed to eat.  CT scan of her head with no acute abnormalities. MRI brain does demonstrate a small acute stroke that does not correlate with all of her current symptoms.  UA is consistent with urinary tract infection, treating with Rocephin.  BMP with stable renal insufficiency  Hospitalist consulted for further observation and treatment of her mental status change.  Quintella Reichert, MD 07/05/15 1446

## 2015-07-04 NOTE — ED Notes (Signed)
Unable to complete NIH and stroke swallow screen due to patient's LOC. Will attempt again later.

## 2015-07-04 NOTE — ED Notes (Signed)
In to attempt NIH and stroke swallow screen. Dr. Ralene Bathe at bedside. Patient only aroused by painful stimuli and immediately goes back to sleeping. Skin warm and dry, respirations even and unlabored. Unable to complete NIH or stroke swallow screen due to patient being extremely lethargic. Will attempt later.

## 2015-07-04 NOTE — ED Notes (Signed)
When patient was moved over to MRI stretcher patient was alert and stating she had to use the restroom.  Patient used bedpan.

## 2015-07-04 NOTE — Progress Notes (Addendum)
Pt with [facility] 5 ED visits and 3 admissions in the last 6 months  Pt with pcp Ricke Hey - ED RN notes report seen by pcp in last 2 days with change of BP meds And being followed at outpatient rehab

## 2015-07-04 NOTE — Therapy (Signed)
[facility] [address] [place], [place], [address] Phone: [phone]   Fax:  [phone]  Occupational Therapy Treatment  Patient Details  Name: Michelle Holmes MRN: [id number] Date of Birth: 1950-08-01 Referring Provider: Dr. Alysia Penna  Encounter Date: 07/04/2015      OT End of Session - 07/04/15 1837    Visit Number 9   Number of Visits 9   Authorization Type [employer] Medicare (no visit limit, no auth, G-code needed); medicaid 2nd   Authorization Time Period cert. 05/28/15-07/27/15   Authorization - Visit Number 9   Authorization - Number of Visits 10   OT Start Time 1017   OT Stop Time 1100   OT Time Calculation (min) 43 min   Activity Tolerance Patient tolerated treatment well   Behavior During Therapy WFL for tasks assessed/performed      Past Medical History  Diagnosis Date  . Emphysema   . Colon cancer ([place])   . Hypertension   . Hypercholesteremia   . Diabetes mellitus   . COPD (chronic obstructive pulmonary disease) ([place])   . Coronary artery disease   . Obesity   . Sleep apnea   . TIA (transient ischemic attack)   . CHF (congestive heart failure) ([place])   . Anemia   . DJD (degenerative joint disease)   . Renal insufficiency   . Chronic back pain   . Scoliosis   . Obesity hypoventilation syndrome ([place])   . Stroke ([place])   . Myocardial infarction [facility])     Past Surgical History  Procedure Laterality Date  . Coronary angioplasty with stent placement    . Colon surgery      There were no vitals filed for this visit.      Subjective Assessment - 07/04/15 1831    Subjective  I am working myhand every day   Pertinent History CVA 01/13/16, hx of previous CVA with risidual R-sided weakness, hospitalization 05/05/15 with possible TIA or seizure disorder; also hx of HTN, MI, CHF, CAD, DM, COPD, emphysema, sleep apnea, chronic back pain, scoliosis, DJD, renal insufficiency, anemia, and colon cancer.   Patient Stated Goals get back to "all that I did before"; when questioned pt stated that she wanted to work on toileting transfer ( but inconsistent responses)   Currently in Pain? No/denies   Pain Score 0-No pain                      OT Treatments/Exercises (OP) - 07/04/15 1832    ADLs   Functional Mobility Patient able to transfer herself level surface via squat pivot, toward left side with only set up assistance and verbal cueing.  Patient with insufficient forward weight shift to unweight self for transfer initially, but with cueing and facilitation able to carry over this technique for safe transfer.  Patient required min assist to transfer toward right side - level surface.     ADL Comments Patient has been wearing resting hand splint, and completing edema massage - and today her hand was not edematous.  Patient reports more consistent performance with edema control     Neurological Re-education Exercises   Other Exercises 1 Neuromuscular reeducation to address weight shifting in sitting.  Patient with limited ability to extend thoracic spine, and sustain this posture.  Patient overactivates through head/neck extension with attempts to sit up tall.  Patient able to weight shift toward right side to free up right shoulder motion, and even accepted some  weight through right forearm in side sit position.  In side sit, able to recruit scapul stabilizers in right side.                  OT Education - 07/04/15 1837    Education provided Yes   Education Details weight shift for safe transfer   Person(s) Educated Patient   Methods Explanation;Demonstration;Tactile cues;Verbal cues   Comprehension Need further instruction             OT Long Term Goals - 07/04/15 1840    OT LONG TERM GOAL #1   Title Pt will perform self PROM HEP with mod prompts/cueing.--check goals 06/28/15   Status Partially Met   OT LONG TERM GOAL #2   Title Pt will participate in functional  activities for at least 53mn with mod prompts.   Status Achieved   OT LONG TERM GOAL #3   Title Pt will tolerate/allow gentle PROM to RUE at least 75% of the time.   Status Achieved   OT LONG TERM GOAL #4   Title [facility]   Status Achieved   OT LONG TERM GOAL #5   Title Patient will don / doff pull over shirt with no greater than mod assist ( due 7/15)   Time 4   Period Weeks   Status New   Long Term Additional Goals   Additional Long Term Goals Yes   OT LONG TERM GOAL #6   Title Patient will complete toilet transfer with min assist   Time 4   Period Weeks   Status New               Plan - 07/04/15 1838    Clinical Impression Statement Patient with consistent attendance and particiaption in OT program.  Patient is showing improved tolerance to movement, and improved range of motion and reduced pain and edeam in right UE.  Patient is requiring less physical assistance for level surface transfers.     Rehab Potential Fair   Clinical Impairments Affecting Rehab Potential no caregiver support (during rehab affecting carryover)   OT Frequency 2x / week   OT Duration 8 weeks   OT Treatment/Interventions Self-care/ADL training;Therapeutic exercise;Functional Mobility Training;Patient/family education;Balance training;Splinting;Manual Therapy;Neuromuscular education;Ultrasound;Energy conservation;Manual lymph drainage;Therapeutic exercises;Therapeutic activities;DME and/or AE instruction;Parrafin;Cryotherapy;Electrical Stimulation;Fluidtherapy;Cognitive remediation/compensation;Visual/perceptual remediation/compensation;Passive range of motion;Contrast Bath;Moist Heat   Plan NMR RUE, ADL - Upper body dressing   OT Home Exercise Plan edema massage   Consulted and Agree with Plan of Care Patient      Patient will benefit from skilled therapeutic intervention in order to improve the following deficits and impairments:  Decreased activity  tolerance, Decreased balance, Decreased cognition, Decreased coordination, Decreased endurance, Decreased range of motion, Decreased mobility, Decreased knowledge of use of DME, Decreased knowledge of precautions, Decreased strength, Difficulty walking, Increased edema, Impaired perceived functional ability, Impaired flexibility, Obesity, Impaired UE functional use, Impaired tone, Pain, Impaired vision/preception, Improper body mechanics  Visit Diagnosis: Spastic hemiplegia affecting right dominant side (HCC) - Plan: Ot plan of care cert/re-cert  Muscle weakness (generalized) - Plan: Ot plan of care cert/re-cert  Other disturbances of skin sensation - Plan: Ot plan of care cert/re-cert  Other lack of coordination - Plan: Ot plan of care cert/re-cert  Visuospatial deficit - Plan: Ot plan of care cert/re-cert  Pain In Right Arm - Plan: Ot plan of care cert/re-cert  Other symptoms and signs involving cognitive functions following cerebral infarction -  Plan: Ot plan of care cert/re-cert  Right sided weakness - Plan: Ot plan of care cert/re-cert      G-Codes - 65/53/74 1843    Functional Assessment Tool Used clinical judgement, participation in functional task, anticipated/reported mod-max A for ADLs   Functional Limitation Self care   Self Care Current Status (M2707) At least 80 percent but less than 100 percent impaired, limited or restricted   Self Care Goal Status (E6754) At least 60 percent but less than 80 percent impaired, limited or restricted      Problem List Patient Active Problem List   Diagnosis Date Noted  . Spastic hemiplegia affecting nondominant side ([place]) 07/01/2015  . Obesity, morbid ([place]) 07/01/2015  . Absolute anemia   . Thrombocytopenia ([place])   . Acute ischemic VBA thalamic stroke ([place]) 01/18/2015  . AKI (acute kidney injury) ([place])   . Dysarthria   . Lethargy   . DM type 2 with diabetic peripheral neuropathy ([place])   . Labile blood pressure   . Hyperlipidemia    . History of CVA with residual deficit   . Chronic obstructive pulmonary disease ([place])   . Hemiparesis, aphasia, and dysphagia as late effect of cerebrovascular accident (CVA) ([place])   . Acute ischemic stroke ([place])   . CVA (cerebral vascular accident) ([place]) 01/14/2015  . CVA (cerebral infarction) 01/14/2015  . Right hemiplegia ([place])   . Right sided weakness 01/13/2015  . Abdominal pain 06/20/2014  . Altered mental status 02/08/2012  . Sleep apnea 12/08/2010  . Morbid obesity ([place]) 12/08/2010  . CHF (congestive heart failure) ([place]) 12/08/2010  . Chronic renal failure 12/08/2010  . TIA (transient ischemic attack) 12/08/2010  . CAD (coronary artery disease) 12/08/2010  . Cellulitis of pubic region 12/01/2010  . Uncontrolled type 2 DM with hyperosmolar nonketotic hyperglycemia ([place]) 12/01/2010    Mariah Milling, OTR/L 07/04/2015, 6:46 PM  [facility] [address] [place], [place], [address] Phone: [phone]   Fax:  [phone]  Name: Michelle Holmes MRN: [id number] Date of Birth: Jul 23, 1950

## 2015-07-04 NOTE — Consult Note (Signed)
Admission H&P    Chief Complaint: Altered mental status and syncope.  HPI: Michelle Holmes is an 65 y.o. female with a history of stroke in December 2016, hypertension, hyperlipidemia, diabetes mellitus, COPD and coronary artery disease, brought to the ED following an episode of loss of consciousness while undergoing outpatient rehabilitation. Episode occurred during a bout of nausea and vomiting. No seizure activity was described. Patient was noted to have slurred speech. She has persistent right hemiparesis from previous stroke which was not noticeably changed. She has been on aspirin and Plavix daily for antiplatelet therapy. CT scan of her head showed no acute intracranial abnormality. MRI showed a small non-hemorrhagic acute left parietal infarct. Patient has been sedated and difficult to arouse. She was given baclofen for spasticity starting at 10 mg twice a day on 07/01/2015 and reportedly took her first dose of baclofen this morning. She has been afebrile. Blood sugar was 135. Urine showed findings indicative of possible acute UTI. She was started on Rocephin. She was given naloxone 0.2 mg 2 with no apparent change in level of alertness.   LSN: Unclear tPA Given: No: No new focal deficits mRankin:  Past Medical History  Diagnosis Date  . Emphysema   . Colon cancer ([place])   . Hypertension   . Hypercholesteremia   . Diabetes mellitus   . COPD (chronic obstructive pulmonary disease) ([place])   . Coronary artery disease   . Obesity   . Sleep apnea   . TIA (transient ischemic attack)   . CHF (congestive heart failure) ([place])   . Anemia   . DJD (degenerative joint disease)   . Renal insufficiency   . Chronic back pain   . Scoliosis   . Obesity hypoventilation syndrome ([place])   . Stroke ([place])   . Myocardial infarction [facility])     Past Surgical History  Procedure Laterality Date  . Coronary angioplasty with stent placement    . Colon surgery      Family History  Problem Relation Age of  Onset  . Stroke Maternal Aunt    Social History:  reports that she has quit smoking. She does not have any smokeless tobacco history on file. She reports that she does not drink alcohol or use illicit drugs.  Allergies:  Allergies  Allergen Reactions  . Penicillins Hives and Swelling    Has patient had a PCN reaction causing immediate rash, facial/tongue/throat swelling, SOB or lightheadedness with hypotension: yes Has patient had a PCN reaction causing severe rash involving mucus membranes or skin necrosis: no Has patient had a PCN reaction that required hospitalization no Has patient had a PCN reaction occurring within the last 10 years: no If all of the above answers are "NO", then may proceed with Cephalosporin use.     Medications: Preadmission medications were reviewed by me.  ROS: Unavailable due to patient's somnolent state.  Physical Examination: Blood pressure 132/71, pulse 46, temperature 98.4 F (36.9 C), temperature source Oral, resp. rate 13, SpO2 99 %.  HEENT-  Normocephalic, no lesions, without obvious abnormality.  Normal external eye and conjunctiva.  Normal TM's bilaterally.  Normal auditory canals and external ears. Normal external nose, mucus membranes and septum.  Normal pharynx. Neck supple with no masses, nodes, nodules or enlargement. Cardiovascular - bradycardic, normal S1 and S2, no murmur Lungs - chest clear, no wheezing, rales, normal symmetric air entry Abdomen - soft, non-tender; bowel sounds normal; no masses,  no organomegaly Extremities - mild edema distal legs and feet  Neurologic Examination: Patient was obtunded and could not be aroused except for partial eye-opening. There was no visual fixation and tracking. She followed no commands. Pupils were small and equal. Reactive to light could not be determined. Extraocular movements were intact with oculocephalic maneuvers. Face was symmetrical at rest. Muscle tone was increased in right  extremities moderately. She had withdrawal movements of lower extremities to noxious stimuli which appeared to be equal. There was no abnormal posturing of the plan flexion of right upper extremity at the elbow. Deep tendon reflexes were trace only in the upper extremities and absent in lower extremities. Plantar responses were mute.  Results for orders placed or performed during the hospital encounter of 07/04/15 (from the past 48 hour(s))  CBG monitoring, ED     Status: Abnormal   Collection Time: 07/04/15  2:54 PM  Result Value Ref Range   Glucose-Capillary 159 (H) 65 - 99 mg/dL   Comment 1 Notify RN    Comment 2 Document in Chart   Comprehensive metabolic panel     Status: Abnormal   Collection Time: 07/04/15  3:28 PM  Result Value Ref Range   Sodium 143 135 - 145 mmol/L   Potassium 4.0 3.5 - 5.1 mmol/L   Chloride 112 (H) 101 - 111 mmol/L   CO2 21 (L) 22 - 32 mmol/L   Glucose, Bld 139 (H) 65 - 99 mg/dL   BUN 47 (H) 6 - 20 mg/dL   Creatinine, Ser 2.28 (H) 0.44 - 1.00 mg/dL   Calcium 9.7 8.9 - 10.3 mg/dL   Total Protein 8.1 6.5 - 8.1 g/dL   Albumin 4.6 3.5 - 5.0 g/dL   AST 15 15 - 41 U/L   ALT 12 (L) 14 - 54 U/L   Alkaline Phosphatase 72 38 - 126 U/L   Total Bilirubin 0.5 0.3 - 1.2 mg/dL   GFR calc non Af Amer 22 (L) >60 mL/min   GFR calc Af Amer 25 (L) >60 mL/min    Comment: (NOTE) The eGFR has been calculated using the CKD EPI equation. This calculation has not been validated in all clinical situations. eGFR's persistently <60 mL/min signify possible Chronic Kidney Disease.    Anion gap 10 5 - 15  CBC with Differential     Status: Abnormal   Collection Time: 07/04/15  3:28 PM  Result Value Ref Range   WBC 8.0 4.0 - 10.5 K/uL   RBC 3.52 (L) 3.87 - 5.11 MIL/uL   Hemoglobin 10.8 (L) 12.0 - 15.0 g/dL   HCT 31.7 (L) 36.0 - 46.0 %   MCV 90.1 78.0 - 100.0 fL   MCH 30.7 26.0 - 34.0 pg   MCHC 34.1 30.0 - 36.0 g/dL   RDW 15.1 11.5 - 15.5 %   Platelets 203 150 - 400 K/uL    Neutrophils Relative % 66 %   Neutro Abs 5.2 1.7 - 7.7 K/uL   Lymphocytes Relative 27 %   Lymphs Abs 2.2 0.7 - 4.0 K/uL   Monocytes Relative 5 %   Monocytes Absolute 0.4 0.1 - 1.0 K/uL   Eosinophils Relative 2 %   Eosinophils Absolute 0.1 0.0 - 0.7 K/uL   Basophils Relative 0 %   Basophils Absolute 0.0 0.0 - 0.1 K/uL  Ethanol     Status: None   Collection Time: 07/04/15  3:28 PM  Result Value Ref Range   Alcohol, Ethyl (B) <5 <5 mg/dL    Comment:        LOWEST DETECTABLE  LIMIT FOR SERUM ALCOHOL IS 5 mg/dL FOR MEDICAL PURPOSES ONLY   Blood gas, venous     Status: Abnormal   Collection Time: 07/04/15  3:28 PM  Result Value Ref Range   pH, Ven 7.308 (H) 7.250 - 7.300   pCO2, Ven 43.3 (L) 45.0 - 50.0 mmHg   pO2, Ven 35.1 31.0 - 45.0 mmHg   Bicarbonate 21.1 20.0 - 24.0 mEq/L   TCO2 19.9 0 - 100 mmol/L   Acid-base deficit 4.5 (H) 0.0 - 2.0 mmol/L   O2 Saturation 58.2 %   Patient temperature 98.6    Collection site VEIN    Drawn by DRAWN BY RN    Sample type VENOUS   I-stat troponin, ED     Status: None   Collection Time: 07/04/15  3:33 PM  Result Value Ref Range   Troponin i, poc 0.01 0.00 - 0.08 ng/mL   Comment 3            Comment: Due to the release kinetics of cTnI, a negative result within the first hours of the onset of symptoms does not rule out myocardial infarction with certainty. If myocardial infarction is still suspected, repeat the test at appropriate intervals.   I-stat Chem 8, ED     Status: Abnormal   Collection Time: 07/04/15  3:35 PM  Result Value Ref Range   Sodium 142 135 - 145 mmol/L   Potassium 4.0 3.5 - 5.1 mmol/L   Chloride 114 (H) 101 - 111 mmol/L   BUN 41 (H) 6 - 20 mg/dL   Creatinine, Ser 2.50 (H) 0.44 - 1.00 mg/dL   Glucose, Bld 135 (H) 65 - 99 mg/dL   Calcium, Ion 1.09 (L) 1.13 - 1.30 mmol/L   TCO2 17 0 - 100 mmol/L   Hemoglobin 10.9 (L) 12.0 - 15.0 g/dL   HCT 32.0 (L) 36.0 - 46.0 %  Urinalysis, Routine w reflex microscopic     Status:  Abnormal   Collection Time: 07/04/15  4:09 PM  Result Value Ref Range   Color, Urine YELLOW YELLOW   APPearance CLOUDY (A) CLEAR   Specific Gravity, Urine 1.009 1.005 - 1.030   pH 7.0 5.0 - 8.0   Glucose, UA NEGATIVE NEGATIVE mg/dL   Hgb urine dipstick TRACE (A) NEGATIVE   Bilirubin Urine NEGATIVE NEGATIVE   Ketones, ur NEGATIVE NEGATIVE mg/dL   Protein, ur NEGATIVE NEGATIVE mg/dL   Nitrite NEGATIVE NEGATIVE   Leukocytes, UA LARGE (A) NEGATIVE  Urine microscopic-add on     Status: Abnormal   Collection Time: 07/04/15  4:09 PM  Result Value Ref Range   Squamous Epithelial / LPF 0-5 (A) NONE SEEN   WBC, UA TOO NUMEROUS TO COUNT 0 - 5 WBC/hpf   RBC / HPF 0-5 0 - 5 RBC/hpf   Bacteria, UA MANY (A) NONE SEEN  Urine rapid drug screen (hosp performed)     Status: None   Collection Time: 07/04/15  4:10 PM  Result Value Ref Range   Opiates NONE DETECTED NONE DETECTED   Cocaine NONE DETECTED NONE DETECTED   Benzodiazepines NONE DETECTED NONE DETECTED   Amphetamines NONE DETECTED NONE DETECTED   Tetrahydrocannabinol NONE DETECTED NONE DETECTED   Barbiturates NONE DETECTED NONE DETECTED    Comment:        DRUG SCREEN FOR MEDICAL PURPOSES ONLY.  IF CONFIRMATION IS NEEDED FOR ANY PURPOSE, NOTIFY LAB WITHIN 5 DAYS.        LOWEST DETECTABLE LIMITS FOR URINE DRUG SCREEN  Drug Class       Cutoff (ng/mL) Amphetamine      1000 Barbiturate      200 Benzodiazepine   546 Tricyclics       503 Opiates          300 Cocaine          300 THC              50    Ct Head Wo Contrast  07/04/2015  CLINICAL DATA:  Syncopal episode cough personal history of stroke, unresponsive EXAM: CT HEAD WITHOUT CONTRAST TECHNIQUE: Contiguous axial images were obtained from the base of the skull through the vertex without intravenous contrast. COMPARISON:  05/06/2015 MRI FINDINGS: Mild diffuse cortical atrophy with moderate low attenuation in the deep white matter. Encephalomalacia left frontal parietal region stable  from prior MRI. No evidence of acute vascular territory infarct or mass. No parenchymal hemorrhage or extra-axial fluid. Age-related basal ganglia calcification bilaterally. No hydrocephalus. Calvarium intact. IMPRESSION: Chronic ischemic change with no acute findings Electronically Signed   By: Skipper Cliche M.D.   On: 07/04/2015 16:02   Mr Brain Wo Contrast (neuro Protocol)  07/04/2015  CLINICAL DATA:  65 year old hypertensive diabetic female with high cholesterol. History of COPD. Colon cancer. Syncopal episode in wheelchair. Prior infarct with baseline right-sided weakness and slurred speech. Recent change in blood pressure medication. Subsequent encounter. EXAM: MRI HEAD WITHOUT CONTRAST TECHNIQUE: Multiplanar, multiecho pulse sequences of the brain and surrounding structures were obtained without intravenous contrast. COMPARISON:  07/04/2015 head CT.  05/06/2015 brain MR. FINDINGS: Exam is motion degraded.  Fast technique imaging had a be utilized. Tiny acute nonhemorrhagic left periatrial infarct. Remote small to moderate-size anterior left frontal lobe infarct with encephalomalacia. Remote posterior left lenticular nucleus infarct. Moderate chronic microvascular changes. Tiny area blood breakdown products right pons probably related to prior hemorrhagic ischemia. Artifact left supraorbital region. Wallerian degeneration with small left cerebral peduncle. Global atrophy without hydrocephalus. No intracranial mass lesion noted on this unenhanced exam. Ectatic vertebral arteries and basilar artery. Narrowing of the right vertebral artery may be present. Internal carotid arteries are patent. Exophthalmos.  Symmetric normal extra-ocular muscles. IMPRESSION: Tiny acute nonhemorrhagic left periatrial infarct. Remainder findings appear chronic as detailed above. Electronically Signed   By: Genia Del M.D.   On: 07/04/2015 19:02   Dg Chest Port 1 View  07/04/2015  CLINICAL DATA:  Syncope. EXAM: PORTABLE  CHEST 1 VIEW COMPARISON:  January 14, 2015 FINDINGS: Stable cardiomediastinal silhouette. No pneumothorax. No pulmonary nodules, masses, or focal infiltrates. Scoliotic changes in the spine again identified. IMPRESSION: No active disease. Electronically Signed   By: Dorise Bullion III M.D   On: 07/04/2015 15:42    Assessment: 65 y.o. female with multiple risk factors for stroke as well as previous stroke in December 2016 presenting with altered mental status with obtundation as well as a small acute left parietal subcortical infarction. Obtunded status most likely secondary to sedation from baclofen, which she apparently started today. Syncopal spell was also likely due to combination of baclofen and Valsalva phenomenon associated with vomiting. No seizure activity was reported.  Stroke Risk Factors - diabetes mellitus, hyperlipidemia and hypertension, family history  Plan: 1. HgbA1c, fasting lipid panel 2. PT consult, OT consult, Speech consult 3. Carotid dopplers 4. Prophylactic therapy-Antiplatelet med: Aspirin and Plavix 5. Risk factor modification 6. Telemetry monitoring 7. EEG routine adult study  C.R. [name] Kindred, MD [facility] [phone]  07/04/2015, 8:15 PM

## 2015-07-04 NOTE — H&P (Signed)
History and Physical    Michelle Holmes [id number] DOB: Jun 16, 1950 DOA: 07/04/2015  PCP: Ricke Hey, MD  Patient coming from: Home.  Chief Complaint: Altered mental status.  HPI: Michelle Holmes is a 65 y.o. female with medical history significant of stroke with right-sided hemiparesis, diabetes mellitus type 2, hypertension, chronic kidney disease stage IV was brought to the ER to patient became suddenly unresponsive and slurring of speech. Patient was at the rehabilitation when patient suddenly had nausea vomiting and became unresponsive. In the ER patient was found to have slurred speech having gradually worsening mental status. MRI of the brain shows parietal infarct and on-call neurologist Dr. [name] Kindred was consulted. Patient was recently placed on baclofen by outpatient neurologist for spasm and patient took her first dose as per the family today. Patient was given a total of 0.8 mg of Narcan with minimal response. Pupils are pinpoint. Urine drug screen is negative for opiates. Patient is also bradycardic with heart rate around 40-50 in the sinus rhythm. Patient has been admitted for acute encephalopathy and stroke.   ED Course: See history of present illness.  Review of Systems: As per HPI, rest all negative.   Past Medical History  Diagnosis Date  . Emphysema   . Colon cancer ([place])   . Hypertension   . Hypercholesteremia   . Diabetes mellitus   . COPD (chronic obstructive pulmonary disease) ([place])   . Coronary artery disease   . Obesity   . Sleep apnea   . TIA (transient ischemic attack)   . CHF (congestive heart failure) ([place])   . Anemia   . DJD (degenerative joint disease)   . Renal insufficiency   . Chronic back pain   . Scoliosis   . Obesity hypoventilation syndrome ([place])   . Stroke ([place])   . Myocardial infarction [facility])     Past Surgical History  Procedure Laterality Date  . Coronary angioplasty with stent placement    . Colon surgery       reports that she  has quit smoking. She does not have any smokeless tobacco history on file. She reports that she does not drink alcohol or use illicit drugs.  Allergies  Allergen Reactions  . Penicillins Hives and Swelling    Has patient had a PCN reaction causing immediate rash, facial/tongue/throat swelling, SOB or lightheadedness with hypotension: yes Has patient had a PCN reaction causing severe rash involving mucus membranes or skin necrosis: no Has patient had a PCN reaction that required hospitalization no Has patient had a PCN reaction occurring within the last 10 years: no If all of the above answers are "NO", then may proceed with Cephalosporin use.     Family History  Problem Relation Age of Onset  . Stroke Maternal Aunt     Prior to Admission medications   Medication Sig Start Date End Date Taking? Authorizing Provider  ADVAIR DISKUS 250-50 MCG/DOSE AEPB Inhale 1 puff into the lungs daily as needed (shortness of breath).  08/22/14  Yes Historical Provider, MD  amLODipine (NORVASC) 5 MG tablet Take 5 mg by mouth at bedtime.   Yes Historical Provider, MD  aspirin EC 81 MG EC tablet Take 1 tablet (81 mg total) by mouth daily. Patient taking differently: Take 81 mg by mouth at bedtime.  06/22/14  Yes Charolette Forward, MD  atorvastatin (LIPITOR) 40 MG tablet Take 1 tablet (40 mg total) by mouth daily. Patient taking differently: Take 40 mg by mouth daily at 6 PM.  02/07/15  Yes Daniel J Angiulli, PA-C  baclofen (LIORESAL) 10 MG tablet Take 1 tablet (10 mg total) by mouth 3 (three) times daily. Start 1 tablet twice daily x 1 week then three times daily 07/01/15  Yes Garvin Fila, MD  clopidogrel (PLAVIX) 75 MG tablet Take 1 tablet (75 mg total) by mouth daily. 02/07/15  Yes Daniel J Angiulli, PA-C  cyclobenzaprine (FLEXERIL) 10 MG tablet Take 10 mg by mouth 2 (two) times daily as needed (pain).   Yes Historical Provider, MD  fenofibrate 54 MG tablet Take 1 tablet (54 mg total) by mouth daily. 02/07/15  Yes  Daniel J Angiulli, PA-C  ferrous sulfate 325 (65 FE) MG tablet Take 1 tablet (325 mg total) by mouth 2 (two) times daily with a meal. 02/07/15  Yes Daniel J Angiulli, PA-C  glipiZIDE (GLUCOTROL) 5 MG tablet Take 1 tablet (5 mg total) by mouth daily before breakfast. 02/07/15  Yes Daniel J Angiulli, PA-C  Insulin Glargine (LANTUS SOLOSTAR) 100 UNIT/ML Solostar Pen Inject 30 Units into the skin at bedtime.   Yes Historical Provider, MD  LYRICA 50 MG capsule Take 1 capsule (50 mg total) by mouth 2 (two) times daily. [id number]  Yes Delora Fuel, MD  metoprolol succinate (TOPROL-XL) 50 MG 24 hr tablet Take 50 mg by mouth daily. Take with or immediately following a meal.   Yes Historical Provider, MD  Oxycodone HCl 20 MG TABS Take 1 tablet by mouth 3 (three) times daily. 06/20/15  Yes Historical Provider, MD  pantoprazole (PROTONIX) 40 MG tablet Take 40 mg by mouth daily.   Yes Historical Provider, MD  amLODipine (NORVASC) 2.5 MG tablet Take 1 tablet (2.5 mg total) by mouth daily. Patient not taking: Reported on 07/04/2015 05/07/15   Charolette Forward, MD  furosemide (LASIX) 20 MG tablet Take 20 mg by mouth daily. Or as directed    Historical Provider, MD  furosemide (LASIX) 40 MG tablet Take 1 tablet (40 mg total) by mouth 2 (two) times daily. Patient taking differently: Take 40 mg by mouth at bedtime.  [id number]   Delora Fuel, MD    Physical Exam: Filed Vitals:   07/04/15 1900 07/04/15 1930 07/04/15 1945 07/04/15 2013  BP: 176/81 169/78 132/71   Pulse: 44 46 46   Temp:      TempSrc:      Resp: 14 12 0 13  SpO2: 100% 100% 99%       Constitutional: Not in distress. Filed Vitals:   07/04/15 1900 07/04/15 1930 07/04/15 1945 07/04/15 2013  BP: 176/81 169/78 132/71   Pulse: 44 46 46   Temp:      TempSrc:      Resp: 14 12 0 13  SpO2: 100% 100% 99%    Eyes: Pinpoint pupils. ENMT: No discharge from the ears eyes nose and mouth. Neck: No mass felt. No neck rigidity. Respiratory: No rhonchi or  crepitations. Cardiovascular: S1 and S2 heard. Abdomen: Soft nontender bowel sounds present. Musculoskeletal: No edema. Right upper extremity is in splint. Skin: No rash. Neurologic: Patient is drowsy minimally responsive to sternal rub. Pupils are pinpoint but reacting to light. Psychiatric: Unable to do since patient is drowsy.   Labs on Admission: I have personally reviewed following labs and imaging studies  CBC:  Recent Labs Lab 07/04/15 1528 07/04/15 1535  WBC 8.0  --   NEUTROABS 5.2  --   HGB 10.8* 10.9*  HCT 31.7* 32.0*  MCV 90.1  --   PLT 203  --  Basic Metabolic Panel:  Recent Labs Lab 07/04/15 1528 07/04/15 1535  NA 143 142  K 4.0 4.0  CL 112* 114*  CO2 21*  --   GLUCOSE 139* 135*  BUN 47* 41*  CREATININE 2.28* 2.50*  CALCIUM 9.7  --    GFR: Estimated Creatinine Clearance: 27 mL/min (by C-G formula based on Cr of 2.5). Liver Function Tests:  Recent Labs Lab 07/04/15 1528  AST 15  ALT 12*  ALKPHOS 72  BILITOT 0.5  PROT 8.1  ALBUMIN 4.6   No results for input(s): LIPASE, AMYLASE in the last 168 hours. No results for input(s): AMMONIA in the last 168 hours. Coagulation Profile: No results for input(s): INR, PROTIME in the last 168 hours. Cardiac Enzymes: No results for input(s): CKTOTAL, CKMB, CKMBINDEX, TROPONINI in the last 168 hours. BNP (last 3 results) No results for input(s): PROBNP in the last 8760 hours. HbA1C: No results for input(s): HGBA1C in the last 72 hours. CBG:  Recent Labs Lab 07/04/15 1454 07/04/15 2036  GLUCAP 159* 96   Lipid Profile: No results for input(s): CHOL, HDL, LDLCALC, TRIG, CHOLHDL, LDLDIRECT in the last 72 hours. Thyroid Function Tests: No results for input(s): TSH, T4TOTAL, FREET4, T3FREE, THYROIDAB in the last 72 hours. Anemia Panel: No results for input(s): VITAMINB12, FOLATE, FERRITIN, TIBC, IRON, RETICCTPCT in the last 72 hours. Urine analysis:    Component Value Date/Time   COLORURINE YELLOW  07/04/2015 1609   APPEARANCEUR CLOUDY* 07/04/2015 1609   LABSPEC 1.009 07/04/2015 1609   PHURINE 7.0 07/04/2015 1609   GLUCOSEU NEGATIVE 07/04/2015 1609   HGBUR TRACE* 07/04/2015 1609   BILIRUBINUR NEGATIVE 07/04/2015 1609   KETONESUR NEGATIVE 07/04/2015 1609   PROTEINUR NEGATIVE 07/04/2015 1609   UROBILINOGEN 0.2 06/20/2014 0808   NITRITE NEGATIVE 07/04/2015 1609   LEUKOCYTESUR LARGE* 07/04/2015 1609   Sepsis Labs: @LABRCNTIP (procalcitonin:4,lacticidven:4) )No results found for this or any previous visit (from the past 240 hour(s)).   Radiological Exams on Admission: Ct Head Wo Contrast  07/04/2015  CLINICAL DATA:  Syncopal episode cough personal history of stroke, unresponsive EXAM: CT HEAD WITHOUT CONTRAST TECHNIQUE: Contiguous axial images were obtained from the base of the skull through the vertex without intravenous contrast. COMPARISON:  05/06/2015 MRI FINDINGS: Mild diffuse cortical atrophy with moderate low attenuation in the deep white matter. Encephalomalacia left frontal parietal region stable from prior MRI. No evidence of acute vascular territory infarct or mass. No parenchymal hemorrhage or extra-axial fluid. Age-related basal ganglia calcification bilaterally. No hydrocephalus. Calvarium intact. IMPRESSION: Chronic ischemic change with no acute findings Electronically Signed   By: Skipper Cliche M.D.   On: 07/04/2015 16:02   Mr Brain Wo Contrast (neuro Protocol)  07/04/2015  CLINICAL DATA:  65 year old hypertensive diabetic female with high cholesterol. History of COPD. Colon cancer. Syncopal episode in wheelchair. Prior infarct with baseline right-sided weakness and slurred speech. Recent change in blood pressure medication. Subsequent encounter. EXAM: MRI HEAD WITHOUT CONTRAST TECHNIQUE: Multiplanar, multiecho pulse sequences of the brain and surrounding structures were obtained without intravenous contrast. COMPARISON:  07/04/2015 head CT.  05/06/2015 brain MR. FINDINGS: Exam  is motion degraded.  Fast technique imaging had a be utilized. Tiny acute nonhemorrhagic left periatrial infarct. Remote small to moderate-size anterior left frontal lobe infarct with encephalomalacia. Remote posterior left lenticular nucleus infarct. Moderate chronic microvascular changes. Tiny area blood breakdown products right pons probably related to prior hemorrhagic ischemia. Artifact left supraorbital region. Wallerian degeneration with small left cerebral peduncle. Global atrophy without hydrocephalus. No intracranial  mass lesion noted on this unenhanced exam. Ectatic vertebral arteries and basilar artery. Narrowing of the right vertebral artery may be present. Internal carotid arteries are patent. Exophthalmos.  Symmetric normal extra-ocular muscles. IMPRESSION: Tiny acute nonhemorrhagic left periatrial infarct. Remainder findings appear chronic as detailed above. Electronically Signed   By: Genia Del M.D.   On: 07/04/2015 19:02   Dg Chest Port 1 View  07/04/2015  CLINICAL DATA:  Syncope. EXAM: PORTABLE CHEST 1 VIEW COMPARISON:  January 14, 2015 FINDINGS: Stable cardiomediastinal silhouette. No pneumothorax. No pulmonary nodules, masses, or focal infiltrates. Scoliotic changes in the spine again identified. IMPRESSION: No active disease. Electronically Signed   By: Dorise Bullion III M.D   On: 07/04/2015 15:42    EKG: Independently reviewed. Sinus bradycardia.  Assessment/Plan Principal Problem:   Acute encephalopathy Active Problems:   Sleep apnea   CHF (congestive heart failure) (H[facility])   CAD (coronary artery disease)   History of CVA with residual deficit   Chronic obstructive pulmonary disease ([facility])   Sinus bradycardia   Type 2 diabetes mellitus with vascular disease ([facility])   UTI (lower urinary tract infection)   Parietal lobe infarction ([place])    #1. Acute encephalopathy most likely a combination of drug-related and stroke and UTI - patient was recently placed on baclofen and  the first dose patient took was today. In addition patient also has acute infarct probably contributing to it. Patient also has UTI. At this time I have discussed with pulmonary critical care. Since patient is not responding to Narcan much that is notable for Narcan drip at this time. We will hold on baclofen and closely observe. Repeat ABG does not show any carbon dioxide narcosis. Patient at this time is able to protect airway. Check ammonia levels. Check EEG. #2. Stroke involving the parietal lobe - discuss with neurologist Dr. [name] Kindred. At this time patient will be placed on neuro checks aspirin swallow evaluation carotid Doppler 2-D echo. Allow for permissive hypertension. #3. Sinus bradycardia - hold off metoprolol. Check TSH. Closely monitor and stepdown. #4. UTI - patient has been placed on ceftriaxone. Follow urine cultures. #5. Hypertension - for now we will hold off antihypertensives and a low flow permissive hypertension due to stroke.. IV hydralazine for systolic blood pressure more than [id number] and diastolic more than [id number]. #6. Diabetes mellitus type 2 - at this time I have decrease patient's Lantus dose from 30 to 5 units in a.m. Closely follow CBGs. Once patient starts eating may have to restart home dose. #7. Chronic kidney disease stage IV with metabolic acidosis - creatinine appears to be at baseline. Closely follow metabolic panel. Medications has to be adjusted accordingly.   DVT prophylaxis: Lovenox. Code Status: Full code.  Family Communication: Unable to reach family.  Disposition Plan: To be determined.  Consults called: Neurology and I have discussed with pulmonary critical care.  Admission status: Inpatient. Stepdown. Likely stay 2-3 days.    Rise Patience MD [facility] Pager [phone].  If 7PM-7AM, please contact night-coverage [email or web address] Password [facility]  07/04/2015, 8:47 PM

## 2015-07-04 NOTE — ED Notes (Signed)
Patient transferred from MRI table to ED stretcher patient got nauseated and vomited. Patient then asked for bathroom again. Patient assisted with bedpan.

## 2015-07-05 ENCOUNTER — Inpatient Hospital Stay (HOSPITAL_COMMUNITY)
Admit: 2015-07-05 | Discharge: 2015-07-05 | Disposition: A | Discharge status: Home / Self Care | Payer: Medicare Other | Attending: Internal Medicine | Admitting: Internal Medicine

## 2015-07-05 ENCOUNTER — Inpatient Hospital Stay (HOSPITAL_COMMUNITY): Payer: Self-pay

## 2015-07-05 ENCOUNTER — Ambulatory Visit: Payer: 59 | Admitting: Occupational Therapy

## 2015-07-05 ENCOUNTER — Ambulatory Visit: Payer: 59 | Admitting: Rehabilitation

## 2015-07-05 DIAGNOSIS — I6930 Unspecified sequelae of cerebral infarction: Secondary | ICD-10-CM

## 2015-07-05 DIAGNOSIS — I639 Cerebral infarction, unspecified: Secondary | ICD-10-CM

## 2015-07-05 DIAGNOSIS — I25119 Atherosclerotic heart disease of native coronary artery with unspecified angina pectoris: Secondary | ICD-10-CM

## 2015-07-05 DIAGNOSIS — G4730 Sleep apnea, unspecified: Secondary | ICD-10-CM

## 2015-07-05 DIAGNOSIS — J449 Chronic obstructive pulmonary disease, unspecified: Secondary | ICD-10-CM

## 2015-07-05 DIAGNOSIS — I5022 Chronic systolic (congestive) heart failure: Secondary | ICD-10-CM

## 2015-07-05 LAB — CBC
HCT: 33.4 % — ABNORMAL LOW (ref 36.0–46.0)
Hemoglobin: 11.3 g/dL — ABNORMAL LOW (ref 12.0–15.0)
MCH: 30.6 pg (ref 26.0–34.0)
MCHC: 33.8 g/dL (ref 30.0–36.0)
MCV: 90.5 fL (ref 78.0–100.0)
Platelets: 192 10*3/uL (ref 150–400)
RBC: 3.69 MIL/uL — ABNORMAL LOW (ref 3.87–5.11)
RDW: 15.3 % (ref 11.5–15.5)
WBC: 7.9 10*3/uL (ref 4.0–10.5)

## 2015-07-05 LAB — TROPONIN I
Troponin I: <0.03 ng/mL (ref <0.031)
Troponin I: <0.03 ng/mL (ref <0.031)

## 2015-07-05 LAB — COMPREHENSIVE METABOLIC PANEL
ALT: 10 U/L — ABNORMAL LOW (ref 14–54)
AST: 14 U/L — ABNORMAL LOW (ref 15–41)
Albumin: 4 g/dL (ref 3.5–5.0)
Alkaline Phosphatase: 65 U/L (ref 38–126)
Anion gap: 9 (ref 5–15)
BUN: 43 mg/dL — ABNORMAL HIGH (ref 6–20)
CO2: 23 mmol/L (ref 22–32)
Calcium: 9.3 mg/dL (ref 8.9–10.3)
Chloride: 112 mmol/L — ABNORMAL HIGH (ref 101–111)
Creatinine, Ser: 2.4 mg/dL — ABNORMAL HIGH (ref 0.44–1.00)
GFR calc Af Amer: 23 mL/min — ABNORMAL LOW (ref >60)
GFR calc non Af Amer: 20 mL/min — ABNORMAL LOW (ref >60)
Glucose, Bld: 76 mg/dL (ref 65–99)
Potassium: 3.6 mmol/L (ref 3.5–5.1)
Sodium: 144 mmol/L (ref 135–145)
Total Bilirubin: 0.4 mg/dL (ref 0.3–1.2)
Total Protein: 7.1 g/dL (ref 6.5–8.1)

## 2015-07-05 LAB — LIPID PANEL
Cholesterol: 156 mg/dL (ref 0–200)
HDL: 25 mg/dL — ABNORMAL LOW (ref >40)
LDL Cholesterol: 78 mg/dL (ref 0–99)
Total CHOL/HDL Ratio: 6.2 RATIO
Triglycerides: 265 mg/dL — ABNORMAL HIGH (ref <150)
VLDL: 53 mg/dL — ABNORMAL HIGH (ref 0–40)

## 2015-07-05 LAB — CBG MONITORING, ED
Glucose-Capillary: 66 mg/dL (ref 65–99)
Glucose-Capillary: 75 mg/dL (ref 65–99)
Glucose-Capillary: 84 mg/dL (ref 65–99)
Glucose-Capillary: 92 mg/dL (ref 65–99)
Glucose-Capillary: 97 mg/dL (ref 65–99)

## 2015-07-05 LAB — GLUCOSE, CAPILLARY
Glucose-Capillary: 87 mg/dL (ref 65–99)
Glucose-Capillary: 195 mg/dL — ABNORMAL HIGH (ref 65–99)

## 2015-07-05 MED ORDER — ATORVASTATIN CALCIUM 40 MG PO TABS
40.0000 mg | ORAL_TABLET | Freq: Every day | ORAL | Status: DC
Start: 2015-07-05 — End: 2015-07-06
  Administered 2015-07-05: 40 mg via ORAL
  Filled 2015-07-05: qty 1

## 2015-07-05 MED ORDER — SODIUM CHLORIDE 0.9 % IV BOLUS (SEPSIS)
500.0000 mL | Freq: Once | INTRAVENOUS | Status: AC
  Administered 2015-07-05: 500 mL via INTRAVENOUS

## 2015-07-05 MED ORDER — INSULIN ASPART 100 UNIT/ML ~~LOC~~ SOLN
0.0000 [IU] | Freq: Every day | SUBCUTANEOUS | Status: DC
Start: 2015-07-05 — End: 2015-07-07

## 2015-07-05 MED ORDER — PANTOPRAZOLE SODIUM 40 MG PO TBEC
40.0000 mg | DELAYED_RELEASE_TABLET | Freq: Every day | ORAL | Status: DC
  Administered 2015-07-05 – 2015-07-07 (×3): 40 mg via ORAL
  Filled 2015-07-05 (×3): qty 1

## 2015-07-05 MED ORDER — FENOFIBRATE 54 MG PO TABS
54.0000 mg | ORAL_TABLET | Freq: Every day | ORAL | Status: DC
  Administered 2015-07-05 – 2015-07-07 (×3): 54 mg via ORAL
  Filled 2015-07-05 (×3): qty 1

## 2015-07-05 MED ORDER — INSULIN GLARGINE 100 UNIT/ML ~~LOC~~ SOLN
15.0000 [IU] | Freq: Every day | SUBCUTANEOUS | Status: DC
  Administered 2015-07-05 – 2015-07-06 (×2): 15 [IU] via SUBCUTANEOUS
  Filled 2015-07-05 (×3): qty 0.15

## 2015-07-05 MED ORDER — INSULIN ASPART 100 UNIT/ML ~~LOC~~ SOLN
0.0000 [IU] | Freq: Three times a day (TID) | SUBCUTANEOUS | Status: DC
Start: 2015-07-06 — End: 2015-07-07
  Administered 2015-07-06: 2 [IU] via SUBCUTANEOUS
  Administered 2015-07-06 (×2): 3 [IU] via SUBCUTANEOUS

## 2015-07-05 MED ORDER — CLOPIDOGREL BISULFATE 75 MG PO TABS
75.0000 mg | ORAL_TABLET | Freq: Every day | ORAL | Status: DC
  Administered 2015-07-05 – 2015-07-07 (×3): 75 mg via ORAL
  Filled 2015-07-05 (×3): qty 1

## 2015-07-05 NOTE — ED Notes (Signed)
EEG tech in the room with the patient.

## 2015-07-05 NOTE — ED Notes (Signed)
Patient given OJ for CBG results of 66.

## 2015-07-05 NOTE — Evaluation (Addendum)
Clinical/Bedside Swallow Evaluation Patient Details  Name: MORGANNA FUJITANI MRN: [id number] Date of Birth: [birth year]  Today's Date: 07/05/2015 Time: SLP Start Time (ACUTE ONLY): [id number] SLP Stop Time (ACUTE ONLY): 1624 SLP Time Calculation (min) (ACUTE ONLY): 19 min  Past Medical History:  Past Medical History  Diagnosis Date  . Emphysema   . Colon cancer ([place])   . Hypertension   . Hypercholesteremia   . Diabetes mellitus   . COPD (chronic obstructive pulmonary disease) ([place])   . Coronary artery disease   . Obesity   . Sleep apnea   . TIA (transient ischemic attack)   . CHF (congestive heart failure) ([place])   . Anemia   . DJD (degenerative joint disease)   . Renal insufficiency   . Chronic back pain   . Scoliosis   . Obesity hypoventilation syndrome ([place])   . Stroke ([place])   . Myocardial infarction [facility])    Past Surgical History:  Past Surgical History  Procedure Laterality Date  . Coronary angioplasty with stent placement    . Colon surgery     HPI:  65 year old female admitted 07/04/15 due to AMS. PMH significant for Left CVA, DM, HTN, CKD IV, MI, COPD. MRI revealed left periatrial infarct   Assessment / Plan / Recommendation Clinical Impression  Pt presents with adequate oral motor strength and function aside from being edentulous. No overt s/s aspiration observed with any consistency. Recommend resuming regular diet and thin liquids. Com/cog status is at baseline. No further ST intervention recommended at this time. Please reconsult if needs arise.     Aspiration Risk  Mild aspiration risk    Diet Recommendation Regular;Thin liquid   Liquid Administration via: Cup;Straw Medication Administration: Whole meds with liquid Supervision: Patient able to self feed Compensations: Minimize environmental distractions;Slow rate;Small sips/bites    Other  Recommendations Oral Care Recommendations: Oral care BID    Prognosis Prognosis for Safe Diet Advancement: Good      Swallow  Study   General Date of Onset: 07/04/15 HPI: 65 year old female admitted 07/04/15 due to AMS. PMH significant for Left CVA, DM, HTN, CKD IV, MI, COPD. MRI revealed left periatrial infarct Type of Study: Bedside Swallow Evaluation Previous Swallow Assessment: none found Diet Prior to this Study: NPO Temperature Spikes Noted: No Respiratory Status: Room air History of Recent Intubation: No Behavior/Cognition: Alert;Cooperative Oral Cavity Assessment: Within Functional Limits Oral Care Completed by SLP: No Oral Cavity - Dentition: Edentulous Vision: Functional for self-feeding Self-Feeding Abilities: Able to feed self Patient Positioning: Upright in bed Baseline Vocal Quality: Normal Volitional Cough: Strong Volitional Swallow: Able to elicit    Oral/Motor/Sensory Function Overall Oral Motor/Sensory Function: Within functional limits, slight right facial droop, likely residual from prior CVA  Ice Chips Ice chips: Not tested   Thin Liquid Thin Liquid: Within functional limits Presentation: Straw    Nectar Thick Nectar Thick Liquid: Not tested   Honey Thick Honey Thick Liquid: Not tested   Puree Puree: Within functional limits Presentation: Self Fed;Spoon   Solid    Solid: Within functional limits Presentation: [place], Najwa Spillane Brown 07/05/2015,4:26 PM  Enriqueta Shutter. Quentin Ore [facility], [place] [phone]

## 2015-07-05 NOTE — ED Notes (Signed)
Receiving nurse on 5W stated that the charge nurse did not feel comfortable accepting this patient on their unit. ED charge nurse notified. 5W stated they weer gong to call the ADN of their unit.

## 2015-07-05 NOTE — ED Notes (Signed)
OJ given. No problems swallowing. Patient sitting upright, awake and alert.

## 2015-07-05 NOTE — ED Notes (Signed)
Patient lethargic and responds to painful stimuli, but will open eyes when name called loudly.

## 2015-07-05 NOTE — ED Notes (Signed)
Hospitalist in to see patient and Echocardiogram tech in the room with the patient.

## 2015-07-05 NOTE — Progress Notes (Signed)
STROKE TEAM PROGRESS NOTE   HISTORY OF PRESENT ILLNESS (per record) Michelle Holmes is an 65 y.o. female with a history of stroke in December 2016, hypertension, hyperlipidemia, diabetes mellitus, COPD and coronary artery disease, brought to the ED following an episode of loss of consciousness while undergoing outpatient rehabilitation. Episode occurred during a bout of nausea and vomiting. No seizure activity was described. Patient was noted to have slurred speech. She has persistent right hemiparesis from previous stroke which was not noticeably changed. She has been on aspirin and Plavix daily for antiplatelet therapy. CT scan of her head showed no acute intracranial abnormality. MRI showed a small non-hemorrhagic acute left parietal infarct. Patient has been sedated and difficult to arouse. She was given baclofen for spasticity starting at 10 mg twice a day on 07/01/2015 and reportedly took her first dose of baclofen this morning. She has been afebrile. Blood sugar was 135. Urine showed findings indicative of possible acute UTI. She was started on Rocephin. She was given naloxone 0.2 mg 2 with no apparent change in level of alertness. Her LKW is unclear. Patient was not administered IV t-PA secondary to no new focal deficits. She was admitted for further evaluation and treatment.   SUBJECTIVE (INTERVAL HISTORY) Patient feels well, wants to go home. Is upset she hasn't eaten. She is alert and colorfully talkative.   OBJECTIVE Temp:  [98 F (36.7 C)-98.4 F (36.9 C)] 98 F (36.7 C) (06/09 1346) Pulse Rate:  [44-59] 50 (06/09 1346) Cardiac Rhythm:  [-] Sinus bradycardia (06/08 1525) Resp:  [0-23] 16 (06/09 1346) BP: (96-176)/(52-98) 145/69 mmHg (06/09 1346) SpO2:  [94 %-100 %] 100 % (06/09 1346) Weight:  [102.513 kg (226 lb)] 102.513 kg (226 lb) (06/08 2154)  CBC:  Recent Labs Lab 07/04/15 1528  07/04/15 2133 07/05/15 0452  WBC 8.0  --  7.6 7.9  NEUTROABS 5.2  --   --   --   HGB 10.8*  <  > 11.0* 11.3*  HCT 31.7*  < > 32.0* 33.4*  MCV 90.1  --  89.9 90.5  PLT 203  --  186 192  < > = values in this interval not displayed.  Basic Metabolic Panel:  Recent Labs Lab 07/04/15 1528 07/04/15 1535 07/04/15 2133 07/05/15 0452  NA 143 142  --  144  K 4.0 4.0  --  3.6  CL 112* 114*  --  112*  CO2 21*  --   --  23  GLUCOSE 139* 135*  --  76  BUN 47* 41*  --  43*  CREATININE 2.28* 2.50* 2.51* 2.40*  CALCIUM 9.7  --   --  9.3    Lipid Panel:    Component Value Date/Time   CHOL 156 07/05/2015 0452   TRIG 265* 07/05/2015 0452   HDL 25* 07/05/2015 0452   CHOLHDL 6.2 07/05/2015 0452   VLDL 53* 07/05/2015 0452   LDLCALC 78 07/05/2015 0452   HgbA1c:  Lab Results  Component Value Date   HGBA1C 7.6* 05/06/2015   Urine Drug Screen:    Component Value Date/Time   LABOPIA NONE DETECTED 07/04/2015 1610   COCAINSCRNUR NONE DETECTED 07/04/2015 1610   LABBENZ NONE DETECTED 07/04/2015 1610   AMPHETMU NONE DETECTED 07/04/2015 1610   THCU NONE DETECTED 07/04/2015 1610   LABBARB NONE DETECTED 07/04/2015 1610      IMAGING  Ct Head Wo Contrast 07/04/2015  Chronic ischemic change with no acute findings   Mr Brain Wo Contrast (neuro Protocol) 07/04/2015  Tiny acute nonhemorrhagic left periatrial infarct. Remainder findings appear chronic as detailed above.   Dg Chest Port 1 View 07/04/2015  No active disease.   Mr Jodene Nam Head/brain Wo Cm 07/04/2015  1. No large vessel occlusion within the intracranial circulation. 2. Severe atheromatous disease involving the posterior cerebral arteries bilaterally, with severe stenosis at the proximal right PCA, stable from prior. 3. Moderate atheromatous irregularity within the distal branches of the anterior circulation, also stable from prior.   Carotid Doppler Left Side- 1-39% ICA stenosis. Right side: No evidence of stenosis in the ICA.  Vertebral artery flow is antegrade.    PHYSICAL EXAM  Physical exam: Exam: Gen: NAD, very  conversant, obese                  CV: RRR, no MRG. No Carotid Bruits. No peripheral edema, warm, nontender Eyes: Conjunctivae clear without exudates or hemorrhage  Neuro: Detailed Neurologic Exam  Speech:    Speech is dysarthric.  Cognition:    The patient is oriented to person, place, and time;  Cranial Nerves:    The pupils are equal, round, and reactive to light. Visual fields are full to finger confrontation. Extraocular movements are intact. Trigeminal sensation is intact and the muscles of mastication are normal. The face is symmetric. The palate elevates in the midline. Hearing intact. Voice is normal. Shoulder shrug is normal. The tongue has normal motion without fasciculations.   Coordination:    No dysmetria noted  Strength:    Spastic right hemiplegia     Sensation: intact to LT      ASSESSMENT/PLAN [name] is a 65 y.o. female with history of stroke in December 2016, hypertension, hyperlipidemia, diabetes mellitus, COPD and coronary artery disease presenting to the ED following a loss of consciousness at his OP rehab session. She did not receive IV t-PA due to no new focal deficits.   Stroke:  Tiny left parietal infarct secondary to small vessel disease source  MRI  Tiny L parietal infarct without deficits due to this event, has resultant spastic right hemiplegia from previous event  MRA  No large vessel occlusion. Severe R>L PCA disease and moderate anterior circulation, stable from Dec 2016  Carotid Doppler  No significant stenosis   2D Echo  pending   LDL 78  HgbA1c 7.6 in April  Lovenox 30 mg sq daily for VTE prophylaxis     aspirin 81 mg daily and clopidogrel 75 mg daily prior to admission, now on aspirin 300 mg suppository daily. Recommend resuming ASA and Plavix when patient can take orally.  Ongoing aggressive stroke risk factor management  Therapy recommendations:  pending   Disposition:  pending   Hypertension  Stable  Permissive  hypertension (OK if < 220/120) but gradually normalize in 5-7 days  Long-term BP goal normotensive  Hyperlipidemia  Home meds:  lipitor 40, resumed in hospital  LDL 78, goal < 70  Continue statin at discharge  Diabetes  HgbA1c 7.6 in April, goal < 7.0  Uncontrolled  Other Stroke Risk Factors  Advanced age  Obesity, There is no weight on file to calculate BMI., recommend weight loss, diet and exercise as appropriate   Hx stroke/TIA  04/2015 HA vs TIA  12/2014 left brain subcortical infarctsfrom small vessel disease  01/2012 possible tiny punctate R lower cerebellar infarct, incidental not related to presenting sx  Coronary artery disease  Hx CHF due to diastolic dysfunction   Other Active Problems  CKD stage VI  Hospital day # 1   Personally examined patient and images, and have participated in and made any corrections needed to history, physical, neuro exam,assessment and plan as stated above.  I have personally obtained the history, evaluated lab date, reviewed imaging studies and agree with radiology interpretations.    Sarina Ill, MD Stroke Neurology [phone] [facility]       To contact Stroke Continuity provider, please refer to [email or web address] After hours, contact General Neurology

## 2015-07-05 NOTE — Progress Notes (Signed)
Offsite EEG completed at [facility]; results pending. 

## 2015-07-05 NOTE — ED Notes (Signed)
A numeric message left with Dr. Leonel Ramsay .

## 2015-07-05 NOTE — Progress Notes (Signed)
VASCULAR LAB PRELIMINARY  PRELIMINARY  PRELIMINARY  PRELIMINARY  Carotid duplex completed.    Left Side-  1-39% ICA stenosis.   Right side: No evidence of stenosis in the ICA.   Vertebral artery flow is antegrade.     Hamilton Marinello, RVT, RDMS 07/05/2015, 11:04 AM

## 2015-07-05 NOTE — ED Notes (Signed)
Spoke with Dr. Leonel Ramsay regarding possible admission to [facility]. Dr. Leonel Ramsay stated he was going to speak with the hospitalist regarding patient.

## 2015-07-05 NOTE — ED Notes (Signed)
Spoke with Ginger, Agricultural consultant for [facility] at [facility] who reports due to pt census, they are not comfortable taking this pt. Called AC, Ron and informed him that the pt was being refused by the unit. Orders for pt and status was reviewed and was determined that pt is appropriate for the unit and sts he will follow up with [facility].

## 2015-07-05 NOTE — ED Notes (Signed)
Pt. Opened eyes to voice but did not respond

## 2015-07-05 NOTE — Procedures (Signed)
History: 65 year old female presenting with altered mental status  Sedation: None  Technique: This is a 21 channel routine scalp EEG performed at the bedside with bipolar and monopolar montages arranged in accordance to the international 10/20 system of electrode placement. One channel was dedicated to EKG recording.    Background: At the onset of recording, she is asleep with symmetrically appearing sleep structures. With arousal, she is initially very drowsy with some admixed delta with posterior dominant rhythm of 8.5 Hz. Delta then improves and the waking EEG consists predominantly of alpha and beta activities. There was no epileptiform activity seen.  Photic stimulation: Physiologic driving is not performed  EEG Abnormalities: None  Clinical Interpretation: This normal EEG is recorded in the waking and sleep state. There was no seizure or seizure predisposition recorded on this study. Please note that a normal EEG does not preclude the possibility of epilepsy.   Roland Rack, MD [facility] [phone]  If 7pm- 7am, please page neurology on call as listed in [place].

## 2015-07-05 NOTE — Progress Notes (Addendum)
[facility] Progress Note  Patient: Michelle Holmes [id number]   PCP: Ricke Hey, MD DOB: 1951/01/18   DOA: 07/04/2015   DOS: 07/05/2015   Date of Service: the patient was seen and examined on 07/05/2015  Subjective: The patient has waxing and waning mental status. Patient was awake in the morning and talking on the phone with her sister. Later on the patient was lethargic and not responding to verbal stimuli. Although no tonic-clonic activity identified between these 2 episodes. Within 5 minutes the patient was awake again and was answering questions and did not have any acute complaints regarding chest pain and abdominal pain nausea vomiting headache or focal deficit. Nutrition: Patient remains nothing by mouth until speech therapy evaluation  Brief hospital course: Patient was admitted on 07/04/2015, with complaint of confusion, was found to have acute encephalopathy as well as small incidental nonhemorrhagic left infarct. Currently further plan is continue further stroke workup as well as workup for acute encephalopathy.  Assessment and Plan: 1. Acute encephalopathy Patient has waxing and waning mental status changes with returning to baseline in between. At present I do not see any seizure-like activity. EEG is also unremarkable. Discuss with neurology who will follow-up with the patient. Patient will be transferred to [facility] for further workup. Her infarct would not be able to explain patient's mental status changes. Possibility of baclofen-induced encephalopathy is more likely. Ammonia level 29, ABG does not show any hypercarbia, TSH is normal, ethanol is undetectable, UDS is clear.  Sleep apnea can also play a role in patient's waxing and waning mental status.  2. Small left parietal nonhemorrhagic infarct. Prior history of CVA with residual weakness. Chronic spastic right hemiplegia. Right foot drop. Patient is oriented aspirin and Plavix. Neurology  recommended carotid Dopplers as well as echocardiogram. EEG is ordered. Patient will be transferred to [facility] for further workup. PTOT and speech therapy consult. LDL is 78. Resume Lipitor once patient therapy clears the patient.  3. Sinus bradycardia. Patient has chronic sinus bradycardia Normal TSH. Holding metoprolol. Less likely the cause of patient's acute encephalopathy.  4. UTI. Urine has too numerous to count WBC. With acute encephalopathy patient will be treated for suspected UTI. Ceftriaxone has been initiated. Follow urine culture. No evidence of sepsis at present.  5. 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 Serum creatinine at baseline. Monitor renal function daily as well as monitor ins and outs.  6. Type 2 diabetes mellitus On hemoglobin A1c pending. On sliding scale insulin with every 4 hours CBG. Lantus dose reduced due to nothing by mouth status.  7. Obstructive sleep apnea. Morbid obesity. I am unable to identify any recent sleep study. Continue oxygen. Possibility of sleep apnea causing patient to have waxing and waning mental status is highly likely.  Pain management: When necessary Tylenol Activity: physical therapy, occupational therapy and speech therapy consulted Bowel regimen: last BM prior to admission Diet: Nothing by mouth pending speech therapy consult, cardiac and carb modified diet after that. DVT Prophylaxis: subcutaneous Heparin  Advance goals of care discussion: Full code  Family Communication: no family was present at bedside, at the time of interview.   Disposition:  Discharge to SNF versus home with home health, pending improvement in mental status Expected discharge date: 07/06/2015  Consultants: Neurology Procedures: Echocardiogram, carotid Doppler  Antibiotics: Anti-infectives    Start     Dose/Rate Route Frequency Ordered Stop   07/05/15 1700  cefTRIAXone (ROCEPHIN) 1 g in dextrose 5 %  50 mL  IVPB     1 g 100 mL/hr over 30 Minutes Intravenous Every 24 hours 07/04/15 2049     07/04/15 1700  cefTRIAXone (ROCEPHIN) 1 g in dextrose 5 % 50 mL IVPB     1 g 100 mL/hr over 30 Minutes Intravenous  Once 07/04/15 1658 07/04/15 1743       No intake or output data in the 24 hours ending 07/05/15 1530 There were no vitals filed for this visit.  Objective: Physical Exam: Filed Vitals:   07/05/15 1312 07/05/15 1345 07/05/15 1346 07/05/15 1505  BP:  145/69 145/69 154/76  Pulse:  50 50 49  Temp: 98.2 F (36.8 C)  98 F (36.7 C) 98.3 F (36.8 C)  TempSrc:   Oral Oral  Resp:  9 16 18   SpO2:  100% 100% 100%    General: Alert, Awake and Oriented to Time, Place and Person. Appear in mild distress, mental status changes to being lethargic and responding to painful stimuli in between. Eyes: PERRL, Conjunctiva normal ENT: Oral Mucosa clear moist. Neck: difficult to assess JVD, no Abnormal Mass Or lumps Cardiovascular: S1 and S2 Present, no Murmur, Peripheral Pulses Present Respiratory: Bilateral Air entry equal and Decreased, Clear to Auscultation, no Crackles, no wheezes Abdomen: Bowel Sound present, Soft and no tenderness Skin: no redness, no Rash  Extremities: no Pedal edema, no calf tenderness Neurologic: Mental status AAOx3, speech slightly slurred at baseline, attention normal,  Cranial Nerves PERRL, EOM normal and present,  Motor strength left strength 5/5, right 2/5 Sensation present to light touch,  Reflexes difficult to assess knee and biceps, babinski negative,   Data Reviewed: CBC:  Recent Labs Lab 07/04/15 1528 07/04/15 1535 07/04/15 2133 07/05/15 0452  WBC 8.0  --  7.6 7.9  NEUTROABS 5.2  --   --   --   HGB 10.8* 10.9* 11.0* 11.3*  HCT 31.7* 32.0* 32.0* 33.4*  MCV 90.1  --  89.9 90.5  PLT 203  --  186 [id number]   Basic Metabolic Panel:  Recent Labs Lab 07/04/15 1528 07/04/15 1535 07/04/15 2133 07/05/15 0452  NA 143 142  --  144  K 4.0 4.0  --  3.6  CL  112* 114*  --  112*  CO2 21*  --   --  23  GLUCOSE 139* 135*  --  76  BUN 47* 41*  --  43*  CREATININE 2.28* 2.50* 2.51* 2.40*  CALCIUM 9.7  --   --  9.3    Liver Function Tests:  Recent Labs Lab 07/04/15 1528 07/05/15 0452  AST 15 14*  ALT 12* 10*  ALKPHOS 72 65  BILITOT 0.5 0.4  PROT 8.1 7.1  ALBUMIN 4.6 4.0   No results for input(s): LIPASE, AMYLASE in the last 168 hours.  Recent Labs Lab 07/04/15 2320  AMMONIA 29   Coagulation Profile: No results for input(s): INR, PROTIME in the last 168 hours. Cardiac Enzymes:  Recent Labs Lab 07/04/15 2133 07/05/15 0247 07/05/15 0907  TROPONINI <0.03 <0.03 <0.03   BNP (last 3 results) No results for input(s): PROBNP in the last 8760 hours.  CBG:  Recent Labs Lab 07/05/15 0003 07/05/15 0409 07/05/15 0751 07/05/15 0851 07/05/15 1201  GLUCAP 97 75 66 92 84    Studies: Ct Head Wo Contrast  07/04/2015  CLINICAL DATA:  Syncopal episode cough personal history of stroke, unresponsive EXAM: CT HEAD WITHOUT CONTRAST TECHNIQUE: Contiguous axial images were obtained from the base of the skull through  the vertex without intravenous contrast. COMPARISON:  05/06/2015 MRI FINDINGS: Mild diffuse cortical atrophy with moderate low attenuation in the deep white matter. Encephalomalacia left frontal parietal region stable from prior MRI. No evidence of acute vascular territory infarct or mass. No parenchymal hemorrhage or extra-axial fluid. Age-related basal ganglia calcification bilaterally. No hydrocephalus. Calvarium intact. IMPRESSION: Chronic ischemic change with no acute findings Electronically Signed   By: Skipper Cliche M.D.   On: 07/04/2015 16:02   Mr Brain Wo Contrast (neuro Protocol)  07/04/2015  CLINICAL DATA:  65 year old hypertensive diabetic female with high cholesterol. History of COPD. Colon cancer. Syncopal episode in wheelchair. Prior infarct with baseline right-sided weakness and slurred speech. Recent change in blood  pressure medication. Subsequent encounter. EXAM: MRI HEAD WITHOUT CONTRAST TECHNIQUE: Multiplanar, multiecho pulse sequences of the brain and surrounding structures were obtained without intravenous contrast. COMPARISON:  07/04/2015 head CT.  05/06/2015 brain MR. FINDINGS: Exam is motion degraded.  Fast technique imaging had a be utilized. Tiny acute nonhemorrhagic left periatrial infarct. Remote small to moderate-size anterior left frontal lobe infarct with encephalomalacia. Remote posterior left lenticular nucleus infarct. Moderate chronic microvascular changes. Tiny area blood breakdown products right pons probably related to prior hemorrhagic ischemia. Artifact left supraorbital region. Wallerian degeneration with small left cerebral peduncle. Global atrophy without hydrocephalus. No intracranial mass lesion noted on this unenhanced exam. Ectatic vertebral arteries and basilar artery. Narrowing of the right vertebral artery may be present. Internal carotid arteries are patent. Exophthalmos.  Symmetric normal extra-ocular muscles. IMPRESSION: Tiny acute nonhemorrhagic left periatrial infarct. Remainder findings appear chronic as detailed above. Electronically Signed   By: Genia Del M.D.   On: 07/04/2015 19:02   Dg Chest Port 1 View  07/04/2015  CLINICAL DATA:  Syncope. EXAM: PORTABLE CHEST 1 VIEW COMPARISON:  January 14, 2015 FINDINGS: Stable cardiomediastinal silhouette. No pneumothorax. No pulmonary nodules, masses, or focal infiltrates. Scoliotic changes in the spine again identified. IMPRESSION: No active disease. Electronically Signed   By: Dorise Bullion III M.D   On: 07/04/2015 15:42   Mr Jodene Nam Head/brain Wo Cm  07/04/2015  CLINICAL DATA:  Initial evaluation for acute stroke. EXAM: MRA HEAD WITHOUT CONTRAST TECHNIQUE: Angiographic images of the Circle of Willis were obtained using MRA technique without intravenous contrast. COMPARISON:  Previous brain MRI and head CT from earlier the same day.  Comparison also made with prior MRA from 05/06/2015. FINDINGS: ANTERIOR CIRCULATION: Study degraded by motion artifact. Visualized distal cervical segments of the internal carotid arteries are tortuous but patent with antegrade flow. Petrous, cavernous, and supraclinoid segments are patent without flow limiting stenosis. A1 segments patent. Anterior communicating artery within normal limits. Anterior cerebral arteries well opacified to their distal aspects. Left ACA slightly attenuated as compared to the right, stable. M1 segments patent without stenosis or occlusion. MCA bifurcations normal. Distal small vessel atheromatous irregularity within the MCA branches bilaterally. POSTERIOR CIRCULATION: Vertebral arteries patent to the vertebrobasilar junction without flow-limiting stenosis. Left posterior inferior cerebral artery patent. Right posterior inferior cerebral artery not visualized on this exam. Basilar artery widely patent without basilar tip stenosis. Left superior cerebellar artery patent with atheromatous irregularity. Right superior cerebral artery is hypoplastic but grossly patent. Both posterior cerebral arteries arise from the basilar artery. There is a severe stenosis at the proximal right PCA. Right PCA is attenuated distally. Severe atheromatous irregularity with stenoses present within the mid left PCA is well. No aneurysm or vascular malformation. IMPRESSION: 1. No large vessel occlusion within the intracranial circulation. 2.  Severe atheromatous disease involving the posterior cerebral arteries bilaterally, with severe stenosis at the proximal right PCA, stable from prior. 3. Moderate atheromatous irregularity within the distal branches of the anterior circulation, also stable from prior. Electronically Signed   By: Jeannine Boga M.D.   On: 07/04/2015 23:10     Scheduled Meds: . aspirin  300 mg Rectal Daily   Or  . aspirin  325 mg Oral Daily  . cefTRIAXone (ROCEPHIN)  IV  1 g  Intravenous Q24H  . enoxaparin (LOVENOX) injection  30 mg Subcutaneous QHS  . insulin aspart  0-9 Units Subcutaneous Q4H  . insulin glargine  5 Units Subcutaneous Daily  . mometasone-formoterol  2 puff Inhalation BID   Continuous Infusions: . sodium chloride 75 mL/hr (07/05/15 0742)   PRN Meds: hydrALAZINE  Time spent: 30 minutes  Author: Berle Mull, MD [facility] Pager: [phone] 07/05/2015 3:30 PM  If 7PM-7AM, please contact night-coverage at [email or web address], password [facility]

## 2015-07-05 NOTE — ED Notes (Signed)
Attempted to call report. Receiving nurse to call  Report.

## 2015-07-05 NOTE — ED Notes (Signed)
Pt woke up and asked to use bedpan, used bedpan, but fell asleep as this nurse tried to do an NIH assessment

## 2015-07-05 NOTE — ED Notes (Signed)
A numeric message left for Dr. [name] Kindred to call the ED regarding possible admission to [facility] instead of [facility] since no Stepdown beds available at [facility].

## 2015-07-06 ENCOUNTER — Inpatient Hospital Stay (HOSPITAL_COMMUNITY): Payer: Medicare Other

## 2015-07-06 DIAGNOSIS — N184 Chronic kidney disease, stage 4 (severe): Secondary | ICD-10-CM | POA: Diagnosis present

## 2015-07-06 DIAGNOSIS — I639 Cerebral infarction, unspecified: Principal | ICD-10-CM

## 2015-07-06 DIAGNOSIS — N390 Urinary tract infection, site not specified: Secondary | ICD-10-CM

## 2015-07-06 DIAGNOSIS — G9340 Encephalopathy, unspecified: Secondary | ICD-10-CM

## 2015-07-06 DIAGNOSIS — E1159 Type 2 diabetes mellitus with other circulatory complications: Secondary | ICD-10-CM

## 2015-07-06 LAB — CBC WITH DIFFERENTIAL/PLATELET
Basophils Absolute: 0 10*3/uL (ref 0.0–0.1)
Basophils Relative: 0 %
Eosinophils Absolute: 0.2 10*3/uL (ref 0.0–0.7)
Eosinophils Relative: 3 %
HCT: 29.7 % — ABNORMAL LOW (ref 36.0–46.0)
Hemoglobin: 9.8 g/dL — ABNORMAL LOW (ref 12.0–15.0)
Lymphocytes Relative: 36 %
Lymphs Abs: 2.5 10*3/uL (ref 0.7–4.0)
MCH: 29.7 pg (ref 26.0–34.0)
MCHC: 33 g/dL (ref 30.0–36.0)
MCV: 90 fL (ref 78.0–100.0)
Monocytes Absolute: 0.5 10*3/uL (ref 0.1–1.0)
Monocytes Relative: 8 %
Neutro Abs: 3.5 10*3/uL (ref 1.7–7.7)
Neutrophils Relative %: 53 %
Platelets: 173 10*3/uL (ref 150–400)
RBC: 3.3 MIL/uL — ABNORMAL LOW (ref 3.87–5.11)
RDW: 15.1 % (ref 11.5–15.5)
WBC: 6.7 10*3/uL (ref 4.0–10.5)

## 2015-07-06 LAB — COMPREHENSIVE METABOLIC PANEL
ALT: 9 U/L — ABNORMAL LOW (ref 14–54)
AST: 11 U/L — ABNORMAL LOW (ref 15–41)
Albumin: 3.1 g/dL — ABNORMAL LOW (ref 3.5–5.0)
Alkaline Phosphatase: 59 U/L (ref 38–126)
Anion gap: 8 (ref 5–15)
BUN: 41 mg/dL — ABNORMAL HIGH (ref 6–20)
CO2: 22 mmol/L (ref 22–32)
Calcium: 9 mg/dL (ref 8.9–10.3)
Chloride: 112 mmol/L — ABNORMAL HIGH (ref 101–111)
Creatinine, Ser: 2.56 mg/dL — ABNORMAL HIGH (ref 0.44–1.00)
GFR calc Af Amer: 22 mL/min — ABNORMAL LOW (ref >60)
GFR calc non Af Amer: 19 mL/min — ABNORMAL LOW (ref >60)
Glucose, Bld: 130 mg/dL — ABNORMAL HIGH (ref 65–99)
Potassium: 3.9 mmol/L (ref 3.5–5.1)
Sodium: 142 mmol/L (ref 135–145)
Total Bilirubin: 0.6 mg/dL (ref 0.3–1.2)
Total Protein: 5.9 g/dL — ABNORMAL LOW (ref 6.5–8.1)

## 2015-07-06 LAB — HEMOGLOBIN A1C
Hgb A1c MFr Bld: 7.1 % — ABNORMAL HIGH (ref 4.8–5.6)
Mean Plasma Glucose: 157 mg/dL

## 2015-07-06 LAB — GLUCOSE, CAPILLARY
Glucose-Capillary: 128 mg/dL — ABNORMAL HIGH (ref 65–99)
Glucose-Capillary: 128 mg/dL — ABNORMAL HIGH (ref 65–99)
Glucose-Capillary: 128 mg/dL — ABNORMAL HIGH (ref 65–99)
Glucose-Capillary: 152 mg/dL — ABNORMAL HIGH (ref 65–99)
Glucose-Capillary: 161 mg/dL — ABNORMAL HIGH (ref 65–99)
Glucose-Capillary: 176 mg/dL — ABNORMAL HIGH (ref 65–99)

## 2015-07-06 MED ORDER — ATORVASTATIN CALCIUM 80 MG PO TABS
80.0000 mg | ORAL_TABLET | Freq: Every day | ORAL | Status: DC
  Administered 2015-07-06: 80 mg via ORAL
  Filled 2015-07-06: qty 1

## 2015-07-06 NOTE — Progress Notes (Signed)
PROGRESS NOTE    Michelle Holmes  [id number] DOB: 08-Jun-1950 DOA: 07/04/2015 PCP: Ricke Hey, MD (Confirm with patient/family/NH records and if not entered, this HAS to be entered at [facility] point of entry. "No PCP" if truly none.)   Brief Narrative: (Start on day 1 of progress note - keep it brief and live) Loss of consciousness to rule out seizure or CVA, 65 y.o female with  Heart failure, CVA, copd. Improved mentation, neurology workup in progress.   Assessment & Plan:   Principal Problem:   Acute encephalopathy Active Problems:   Sleep apnea   CHF (congestive heart failure) (HCC)   CAD (coronary artery disease)   History of CVA with residual deficit   Chronic obstructive pulmonary disease (HCC)   Sinus bradycardia   Type 2 diabetes mellitus with vascular disease (HCC)   UTI (lower urinary tract infection)   Parietal lobe infarction ([place])   Acute CVA (cerebrovascular accident) ([place])  1. Cardiovascular. Will continue antibiotic therapy with ceftriaxone for urine tract infection, will blood pressure [id number] to [id number] systolic, will target blood pressure less than 180 on inpatient setting and post CVA.  2. Pulmonary, no signs of aspiration, will continue to monitor oxymetry and give as needed supplemental 02 per [place]. Chest film personally reviewed noted significant scoliosis, poor inflation and right side rotation, no infiltrates or effusions.  3. Nephrology. Renal function with cr at 2.56, K at 3.9 with serum bicarb at 41. Clinically euvolemic, will continue to hold on iV fluids, considering a base cr of 2,5, patient has calculated GFR of 23 c/w stage IV CKD.   4. Neurology. MRI confirmed left parietal infarct, will continue neuro checks, physical therapy, US carotids, echocardiogram, continue blood pressure control, dual antiplatelet therapy with asa and clopidogrel. High dose statin. EEG negative for seizures.  5. dvt px  Patient is at moderate risk for worsening neurology  deficit.    DVT prophylaxis: (Lovenox/Heparin/SCD's/anticoagulated/None (if comfort care) Code Status: (Full/Partial - specify details) Family Communication: (Specify name, relationship & date discussed. NO "discussed with patient") Disposition Plan: (specify when and where you expect patient to be discharged). Include barriers to DC in this tab.   Consultants:   neurology  Procedures: (Don't include imaging studies which can be auto populated. Include things that cannot be auto populated i.e. Echo, Carotid and venous dopplers, Foley, Bipap, HD, tubes/drains, wound vac, central lines etc)    Antimicrobials: (specify start and planned stop date. Auto populated tables are space occupying and do not give end dates)  cefriaxone   Subjective: Patient more awake and alert, can not recall the events over last 24 hours. This am back to her baseline, no nausea or vomiting, no chest pain or dyspnea. Anxious to go home.  Objective: Filed Vitals:   07/05/15 1346 07/05/15 1505 07/05/15 2103 07/06/15 0501  BP: 145/69 154/76 148/57 142/51  Pulse: 50 49 58 50  Temp: 98 F (36.7 C) 98.3 F (36.8 C) 98 F (36.7 C) 98 F (36.7 C)  TempSrc: Oral Oral  Oral  Resp: 16 18 18 18   SpO2: 100% 100% 97% 99%    Intake/Output Summary (Last 24 hours) at 07/06/15 0810 Last data filed at 07/05/15 1857  Gross per 24 hour  Intake     50 ml  Output      0 ml  Net     50 ml   There were no vitals filed for this visit.  Examination:  General exam: Deconditioned, not in pain, no  dyspnea E ENT: oral mucosa moist. No significant conjunctival pallor or icterus. Respiratory system: Clear to auscultation. Respiratory effort normal. Bilateral vesicular breath sounds. Cardiovascular system: S1 & S2 heard, RRR. No JVD, murmurs, rubs, gallops or clicks. No pedal edema. Gastrointestinal system: Abdomen is nondistended, soft and nontender. No organomegaly or masses felt. Normal bowel sounds heard. Central  nervous system: Patient awake and alert, positive paresis of the right upper ext, left upper and bilateral lower extremities with no evidence of weakness. Extremities: no edema, palpable pulses/ Skin: No rashes, lesions or ulcers      Data Reviewed: I have personally reviewed following labs and imaging studies  CBC:  Recent Labs Lab 07/04/15 1528 07/04/15 1535 07/04/15 2133 07/05/15 0452 07/06/15 0523  WBC 8.0  --  7.6 7.9 6.7  NEUTROABS 5.2  --   --   --  3.5  HGB 10.8* 10.9* 11.0* 11.3* 9.8*  HCT 31.7* 32.0* 32.0* 33.4* 29.7*  MCV 90.1  --  89.9 90.5 90.0  PLT 203  --  186 192 [id number]   Basic Metabolic Panel:  Recent Labs Lab 07/04/15 1528 07/04/15 1535 07/04/15 2133 07/05/15 0452 07/06/15 0523  NA 143 142  --  144 142  K 4.0 4.0  --  3.6 3.9  CL 112* 114*  --  112* 112*  CO2 21*  --   --  23 22  GLUCOSE 139* 135*  --  76 130*  BUN 47* 41*  --  43* 41*  CREATININE 2.28* 2.50* 2.51* 2.40* 2.56*  CALCIUM 9.7  --   --  9.3 9.0   GFR: Estimated Creatinine Clearance: 26.4 mL/min (by C-G formula based on Cr of 2.56). Liver Function Tests:  Recent Labs Lab 07/04/15 1528 07/05/15 0452 07/06/15 0523  AST 15 14* 11*  ALT 12* 10* 9*  ALKPHOS 72 65 59  BILITOT 0.5 0.4 0.6  PROT 8.1 7.1 5.9*  ALBUMIN 4.6 4.0 3.1*   No results for input(s): LIPASE, AMYLASE in the last 168 hours.  Recent Labs Lab 07/04/15 2320  AMMONIA 29   Coagulation Profile: No results for input(s): INR, PROTIME in the last 168 hours. Cardiac Enzymes:  Recent Labs Lab 07/04/15 2133 07/05/15 0247 07/05/15 0907  TROPONINI <0.03 <0.03 <0.03   BNP (last 3 results) No results for input(s): PROBNP in the last 8760 hours. HbA1C:  Recent Labs  07/05/15 0452  HGBA1C 7.1*   CBG:  Recent Labs Lab 07/05/15 1201 07/05/15 1648 07/05/15 2028 07/06/15 0032 07/06/15 0458  GLUCAP 84 87 195* 176* 128*   Lipid Profile:  Recent Labs  07/05/15 0452  CHOL 156  HDL 25*  LDLCALC 78   TRIG 265*  CHOLHDL 6.2   Thyroid Function Tests:  Recent Labs  07/04/15 2133  TSH 1.784   Anemia Panel: No results for input(s): VITAMINB12, FOLATE, FERRITIN, TIBC, IRON, RETICCTPCT in the last 72 hours. Sepsis Labs: No results for input(s): PROCALCITON, LATICACIDVEN in the last 168 hours.  No results found for this or any previous visit (from the past 240 hour(s)).       Radiology Studies: Ct Head Wo Contrast  07/04/2015  CLINICAL DATA:  Syncopal episode cough personal history of stroke, unresponsive EXAM: CT HEAD WITHOUT CONTRAST TECHNIQUE: Contiguous axial images were obtained from the base of the skull through the vertex without intravenous contrast. COMPARISON:  05/06/2015 MRI FINDINGS: Mild diffuse cortical atrophy with moderate low attenuation in the deep white matter. Encephalomalacia left frontal parietal region stable from prior MRI.  No evidence of acute vascular territory infarct or mass. No parenchymal hemorrhage or extra-axial fluid. Age-related basal ganglia calcification bilaterally. No hydrocephalus. Calvarium intact. IMPRESSION: Chronic ischemic change with no acute findings Electronically Signed   By: Skipper Cliche M.D.   On: 07/04/2015 16:02   Mr Brain Wo Contrast (neuro Protocol)  07/04/2015  CLINICAL DATA:  65 year old hypertensive diabetic female with high cholesterol. History of COPD. Colon cancer. Syncopal episode in wheelchair. Prior infarct with baseline right-sided weakness and slurred speech. Recent change in blood pressure medication. Subsequent encounter. EXAM: MRI HEAD WITHOUT CONTRAST TECHNIQUE: Multiplanar, multiecho pulse sequences of the brain and surrounding structures were obtained without intravenous contrast. COMPARISON:  07/04/2015 head CT.  05/06/2015 brain MR. FINDINGS: Exam is motion degraded.  Fast technique imaging had a be utilized. Tiny acute nonhemorrhagic left periatrial infarct. Remote small to moderate-size anterior left frontal lobe  infarct with encephalomalacia. Remote posterior left lenticular nucleus infarct. Moderate chronic microvascular changes. Tiny area blood breakdown products right pons probably related to prior hemorrhagic ischemia. Artifact left supraorbital region. Wallerian degeneration with small left cerebral peduncle. Global atrophy without hydrocephalus. No intracranial mass lesion noted on this unenhanced exam. Ectatic vertebral arteries and basilar artery. Narrowing of the right vertebral artery may be present. Internal carotid arteries are patent. Exophthalmos.  Symmetric normal extra-ocular muscles. IMPRESSION: Tiny acute nonhemorrhagic left periatrial infarct. Remainder findings appear chronic as detailed above. Electronically Signed   By: Genia Del M.D.   On: 07/04/2015 19:02   Dg Chest Port 1 View  07/04/2015  CLINICAL DATA:  Syncope. EXAM: PORTABLE CHEST 1 VIEW COMPARISON:  January 14, 2015 FINDINGS: Stable cardiomediastinal silhouette. No pneumothorax. No pulmonary nodules, masses, or focal infiltrates. Scoliotic changes in the spine again identified. IMPRESSION: No active disease. Electronically Signed   By: Dorise Bullion III M.D   On: 07/04/2015 15:42   Mr Jodene Nam Head/brain Wo Cm  07/04/2015  CLINICAL DATA:  Initial evaluation for acute stroke. EXAM: MRA HEAD WITHOUT CONTRAST TECHNIQUE: Angiographic images of the Circle of Willis were obtained using MRA technique without intravenous contrast. COMPARISON:  Previous brain MRI and head CT from earlier the same day. Comparison also made with prior MRA from 05/06/2015. FINDINGS: ANTERIOR CIRCULATION: Study degraded by motion artifact. Visualized distal cervical segments of the internal carotid arteries are tortuous but patent with antegrade flow. Petrous, cavernous, and supraclinoid segments are patent without flow limiting stenosis. A1 segments patent. Anterior communicating artery within normal limits. Anterior cerebral arteries well opacified to their distal  aspects. Left ACA slightly attenuated as compared to the right, stable. M1 segments patent without stenosis or occlusion. MCA bifurcations normal. Distal small vessel atheromatous irregularity within the MCA branches bilaterally. POSTERIOR CIRCULATION: Vertebral arteries patent to the vertebrobasilar junction without flow-limiting stenosis. Left posterior inferior cerebral artery patent. Right posterior inferior cerebral artery not visualized on this exam. Basilar artery widely patent without basilar tip stenosis. Left superior cerebellar artery patent with atheromatous irregularity. Right superior cerebral artery is hypoplastic but grossly patent. Both posterior cerebral arteries arise from the basilar artery. There is a severe stenosis at the proximal right PCA. Right PCA is attenuated distally. Severe atheromatous irregularity with stenoses present within the mid left PCA is well. No aneurysm or vascular malformation. IMPRESSION: 1. No large vessel occlusion within the intracranial circulation. 2. Severe atheromatous disease involving the posterior cerebral arteries bilaterally, with severe stenosis at the proximal right PCA, stable from prior. 3. Moderate atheromatous irregularity within the distal branches of the anterior circulation,  also stable from prior. Electronically Signed   By: Jeannine Boga M.D.   On: 07/04/2015 23:10        Scheduled Meds: . aspirin  300 mg Rectal Daily   Or  . aspirin  325 mg Oral Daily  . atorvastatin  40 mg Oral q1800  . cefTRIAXone (ROCEPHIN)  IV  1 g Intravenous Q24H  . clopidogrel  75 mg Oral Daily  . enoxaparin (LOVENOX) injection  30 mg Subcutaneous QHS  . fenofibrate  54 mg Oral Daily  . insulin aspart  0-15 Units Subcutaneous TID WC  . insulin aspart  0-5 Units Subcutaneous QHS  . insulin glargine  15 Units Subcutaneous QHS  . mometasone-formoterol  2 puff Inhalation BID  . pantoprazole  40 mg Oral Daily   Continuous Infusions:    LOS: 2 days         Joal Eakle Gerome Apley, MD [facility] Pager [phone] If 7PM-7AM, please contact night-coverage [email or web address] Password [email or web address] 07/06/2015, 8:10 AM

## 2015-07-06 NOTE — Progress Notes (Addendum)
STROKE TEAM PROGRESS NOTE   HISTORY OF PRESENT ILLNESS (per record) Michelle Holmes is an 65 y.o. female with a history of stroke in December 2016, hypertension, hyperlipidemia, diabetes mellitus, COPD and coronary artery disease, brought to the ED following an episode of loss of consciousness while undergoing outpatient rehabilitation. Episode occurred during a bout of nausea and vomiting. No seizure activity was described. Patient was noted to have slurred speech. She has persistent right hemiparesis from previous stroke which was not noticeably changed. She has been on aspirin and Plavix daily for antiplatelet therapy. CT scan of her head showed no acute intracranial abnormality. MRI showed a small non-hemorrhagic acute left parietal infarct. Patient has been sedated and difficult to arouse. She was given baclofen for spasticity starting at 10 mg twice a day on 07/01/2015 and reportedly took her first dose of baclofen this morning. She has been afebrile. Blood sugar was 135. Urine showed findings indicative of possible acute UTI. She was started on Rocephin. She was given naloxone 0.2 mg 2 with no apparent change in level of alertness.   LSN: Unclear tPA Given: No: No new focal deficits mRankin:   SUBJECTIVE (INTERVAL HISTORY) Her family is not at the bedside.  Overall she feels her condition is rapidly improving. She is anxious to leave for a family wedding and graduation.  No events overnight.  She states she is "all set up for O/P therapy and enjoys the therapy."   OBJECTIVE Temp:  [98 F (36.7 C)-98.3 F (36.8 C)] 98 F (36.7 C) (06/10 0501) Pulse Rate:  [44-58] 50 (06/10 0501) Cardiac Rhythm:  [-] Sinus bradycardia (06/09 1900) Resp:  [0-18] 18 (06/10 0501) BP: (111-155)/(51-88) 142/51 mmHg (06/10 0501) SpO2:  [97 %-100 %] 99 % (06/10 0501)  CBC:  Recent Labs Lab 07/04/15 1528  07/05/15 0452 07/06/15 0523  WBC 8.0  < > 7.9 6.7  NEUTROABS 5.2  --   --  3.5  HGB 10.8*  < > 11.3*  9.8*  HCT 31.7*  < > 33.4* 29.7*  MCV 90.1  < > 90.5 90.0  PLT 203  < > 192 173  < > = values in this interval not displayed.  Basic Metabolic Panel:  Recent Labs Lab 07/05/15 0452 07/06/15 0523  NA 144 142  K 3.6 3.9  CL 112* 112*  CO2 23 22  GLUCOSE 76 130*  BUN 43* 41*  CREATININE 2.40* 2.56*  CALCIUM 9.3 9.0    Lipid Panel:    Component Value Date/Time   CHOL 156 07/05/2015 0452   TRIG 265* 07/05/2015 0452   HDL 25* 07/05/2015 0452   CHOLHDL 6.2 07/05/2015 0452   VLDL 53* 07/05/2015 0452   LDLCALC 78 07/05/2015 0452   HgbA1c:  Lab Results  Component Value Date   HGBA1C 7.1* 07/05/2015   Urine Drug Screen:    Component Value Date/Time   LABOPIA NONE DETECTED 07/04/2015 1610   COCAINSCRNUR NONE DETECTED 07/04/2015 1610   LABBENZ NONE DETECTED 07/04/2015 1610   AMPHETMU NONE DETECTED 07/04/2015 1610   THCU NONE DETECTED 07/04/2015 1610   LABBARB NONE DETECTED 07/04/2015 1610      IMAGING  Ct Head Wo Contrast 07/04/2015   Chronic ischemic change with no acute findings     Mr Brain Wo Contrast (neuro Protocol) 07/04/2015   Tiny acute nonhemorrhagic left periatrial infarct. Remainder findings appear chronic as detailed above.    Dg Chest Port 1 View 07/04/2015   No active disease.    Mr  Mra Head/brain Wo Cm 07/04/2015   1. No large vessel occlusion within the intracranial circulation.  2. Severe atheromatous disease involving the posterior cerebral arteries bilaterally, with severe stenosis at the proximal right PCA, stable from prior.  3. Moderate atheromatous irregularity within the distal branches of the anterior circulation, also stable from prior.    PHYSICAL EXAM  HEENT- Normocephalic, no lesions, without obvious abnormality.Neck supple with no masses, nodes, nodules or enlargement. Cardiovascular - bradycardic, normal S1 and S2, no murmur Lungs - chest clear, no wheezing, rales, normal symmetric air entry Abdomen - soft, non-tender; bowel  sounds normal; no masses, no organomegaly Extremities - mild edema distal legs and feet  Neurologic Examination: Patient was wake and alert.  Ox3 with cues for the month She followed all commands.  Pupils were small and equal. Reactive to light.  Extraocular movements were intact.  Face was symmetrical at rest. Hearing grossly intact.  Good cough  Chronic right sided weakness with increased tone.  Wrist splint on right wrist.  Left side 4/5 throughout  Sensory to LT intact.  FNF intact on left  Gait Defered  ASSESSMENT/PLAN [name] is a 65 y.o. female with history of  emphysema, hypertension, hyperlipidemia, diabetes mellitus, coronary artery disease, obesity, obstructive sleep apnea, previous TIA, congestive heart failure, previous stroke, previous MI, and renal insufficiency presenting with nausea, vomiting, slurred speech, lethargy, and loss of consciousness.  She did not receive IV t-PA due to resolution of deficits.  Stroke: Dominant infarct secondary to severe cerebrovascular disease.  Resultant resolution of deficits  MRI  Tiny acute nonhemorrhagic left periatrial infarct.   MRA  Severe atheromatous disease  Carotid Doppler - Right and left internal carotid arteries - no significant stenosis. Vertebral artery flow is antegrade.   2D Echo - pending  LDL - 78  HgbA1c - 7.1  VTE prophylaxis - Lovenox  Diet heart healthy/carb modified Room service appropriate?: Yes; Fluid consistency:: Thin  aspirin 81 mg daily and clopidogrel 75 mg daily prior to admission, now on aspirin 325 mg daily and clopidogrel 75 mg daily  Patient counseled to be compliant with her antithrombotic medications  Ongoing aggressive stroke risk factor management  Therapy recommendations: Pending  Disposition:  Pending  Hypertension  Stable  Permissive hypertension (OK if < 220/120) but gradually normalize in 5-7 days  Long-term BP goal normotensive  Hyperlipidemia  Home  meds:  Lipitor 40 mg daily resumed in hospital  LDL 78, goal < 70  Increase Lipitor to 80 mg daily  Continue statin at discharge  Diabetes  HgbA1c 7.1, goal < 7.0  Uncontrolled  Other Stroke Risk Factors  Advanced age  Former cigarette smoker - quit  Obesity, There is no weight on file to calculate BMI., recommend weight loss, diet and exercise as appropriate   Hx stroke/TIA  Family hx stroke (aunt)  Coronary artery disease  Obstructive sleep apnea.   Other Active Problems  Anemia - hemoglobin 9.8 ; hematocrit 29.7  Kidney disease - BUN 41 ; Creatinine 2.56  UTI - Rocephin Day #2  Lethargy and loss of consciousness - question secondary to baclofen  ?    Hospital day # 2  Mikey Bussing [facility] Pager [phone] 07/06/2015, 12:10 PM  ATTENDING NOTE: Patient was seen and examined by me personally. Documentation reflects findings. The laboratory and radiographic studies reviewed by me. ROS completed by me personally and pertinent positives fully documented  Condition:  stable  Assessment and plan completed  by me personally and fully documented above. Plans/Recommendations include:     Stroke work-up nearly complete  Will need outpatient follow-up in 2 months when discharged   SIGNED BY: Dr. Elissa Hefty     To contact Stroke Continuity provider, please refer to [email or web address] After hours, contact General Neurology

## 2015-07-06 NOTE — Care Management Note (Addendum)
Case Management Note  Patient Details  Name: Michelle Holmes MRN: [id number] Date of Birth: [birth year]  Subjective/Objective:                 Admitted with AMS/ acute encephalopathy,  history  of stroke with right-sided hemiparesis, diabetes mellitus type 2, hypertension, chronic kidney. Resides with grandson. Assistive devices: walker, wheelchair. States received outpatient PT/OT services PTA. PCP: Ricke Hey.   Action/Plan: Neuro following PT, OT,SLP evaluations pending.... CM to f/u with disposition needs  Expected Discharge Date:   (unknown)               Expected Discharge Plan:  [place]  In-House Referral:     Discharge planning Services  CM Consult  Post Acute Care Choice:    Choice offered to:     DME Arranged:    DME Agency:     HH Arranged:    HH Agency:     Status of Service:  In process, will continue to follow  Medicare Important Message Given:    Date Medicare IM Given:    Medicare IM give by:    Date Additional Medicare IM Given:    Additional Medicare Important Message give by:     If discussed at [place] of Stay Meetings, dates discussed:    Additional Comments: Dijonna Gerges (Daughter) [phone], Leander Rams (Daughter)  [phone]    Whitman Hero [place], [place] [phone] 07/06/2015, 8:41 AM

## 2015-07-06 NOTE — Evaluation (Signed)
Physical Therapy Evaluation Patient Details Name: Michelle Holmes MRN: [id number] DOB: 10-Oct-1950 Today's Date: 07/06/2015   History of Present Illness  65 y.o. female admitted to [facility] on 07/04/15 for AMS.  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  Pt with significant PMhx of HTN, DM, COPD, CAD, obesity, TIA, CHF, anemia, renal insufficiency, chronic back pain/scoliosis, stroke, and MI.  Clinical Impression  Pt is at baseline level of assistance and has good equipment and family support at home.  She would benefit from acute PT to continue to progress her mobility and it would be good if she could resume OP PT/OT at d/c at [facility] clinic where she was getting therapy PTA.   PT to follow acutely for deficits listed below.       Follow Up Recommendations Outpatient PT;Supervision/Assistance - 24 hour (resume OP PT/OT at neurorehab center)    Equipment Recommendations  None recommended by PT    Recommendations for Other Services   NA    Precautions / Restrictions Precautions Precautions: Fall Precaution Comments: due to right hemi      Mobility  Bed Mobility Overal bed mobility: Needs Assistance Bed Mobility: Rolling;Sidelying to Sit Rolling: Min assist Sidelying to sit: Mod assist;HOB elevated       General bed mobility comments: Min assist to roll and reach for right railing, mod to support trunk to get to sitting from right sidelying due to trying to get up from weak side.  Assist needed to progress right leg over EOB.   Transfers Overall transfer level: Needs assistance Equipment used: None Transfers: Sit to/from [employer] Sit to Stand: Mod assist   Squat pivot transfers: Mod assist     General transfer comment: Mod assist to support trunk for balance, anteriorly weight shift over feet and ensure that right knee is blocked to prevent buckling.  Pt able to reach for  chair armrest on the left side (transfer to strong side) and assist with partial stand and turing her trunk.        Modified Rankin (Stroke Patients Only) Modified Rankin (Stroke Patients Only) Pre-Morbid Rankin Score: Moderately severe disability Modified Rankin: Severe disability     Balance Overall balance assessment: Needs assistance Sitting-balance support: Feet supported;Single extremity supported Sitting balance-Leahy Scale: Fair Sitting balance - Comments: supervision EOB once positioned with feet squarely under her.    Standing balance support: Single extremity supported;During functional activity Standing balance-Leahy Scale: Poor Standing balance comment: mod assist for partial stand to squat pivot to chair.                              Pertinent Vitals/Pain Pain Assessment: No/denies pain    Home Living Family/patient expects to be discharged to:: Private residence Living Arrangements: Other relatives (nefew and his wife) Available Help at Discharge: Family;Available 24 hours/day Type of Home: House Home Access: Ramped entrance     Home Layout: One level Home Equipment: Wheelchair - manual;Hospital bed;Walker - 2 wheels;Cane - single point;Bedside commode;Shower seat (electric WC on order)      Prior Function Level of Independence: Needs assistance   Gait / Transfers Assistance Needed: assist from family for mobility and transfers in and out of [place].   ADL's / Homemaking Assistance Needed: Assist to transfer in/out of shower, to bathe thoroughly, to get dressed, in/out of bed, and all IADLs  Hand Dominance   Dominant Hand: Right (originally, but with dense R hemi)    Extremity/Trunk Assessment   Upper Extremity Assessment: RUE deficits/detail RUE Deficits / Details: at baseline R hemi.  Has R UE splint on from home.          Lower Extremity Assessment: RLE deficits/detail RLE Deficits / Details: right LE hemi at baseline, no  significant changes noted.        Communication   Communication: Expressive difficulties (mild, she is understandable)  Cognition Arousal/Alertness: Awake/alert Behavior During Therapy: WFL for tasks assessed/performed Overall Cognitive Status: History of cognitive impairments - at baseline (per OP PTA)       Memory: Decreased short-term memory                       Assessment/Plan    PT Assessment Patient needs continued PT services  PT Diagnosis Difficulty walking;Abnormality of gait;Generalized weakness   PT Problem List Decreased strength;Decreased activity tolerance;Decreased balance;Decreased mobility;Decreased knowledge of use of DME;Decreased knowledge of precautions;Obesity;Impaired sensation;Impaired tone  PT Treatment Interventions DME instruction;Gait training;Stair training;Functional mobility training;Therapeutic activities;Therapeutic exercise;Balance training;Neuromuscular re-education;Cognitive remediation;Patient/family education;Wheelchair mobility training   PT Goals (Current goals can be found in the Care Plan section) Acute Rehab PT Goals Patient Stated Goal: to go home so she can go to her grandchild's graduation (today) PT Goal Formulation: With patient Time For Goal Achievement: 07/20/15 Potential to Achieve Goals: Good    Frequency Min 4X/week           End of Session Equipment Utilized During Treatment: Gait belt Activity Tolerance: Patient tolerated treatment well Patient left: in chair;with call bell/phone within reach;with chair alarm set Nurse Communication: Mobility status         Time: [id number] PT Time Calculation (min) (ACUTE ONLY): 22 min   Charges:   PT Evaluation $PT Eval Moderate Complexity: 1 Procedure          Inmer Nix B. [place], [place], DPT [phone]   07/06/2015, 9:08 AM

## 2015-07-06 NOTE — Progress Notes (Signed)
  Echocardiogram 2D Echocardiogram has been performed.  Johny Chess 07/06/2015, 10:58 AM

## 2015-07-07 LAB — GLUCOSE, CAPILLARY
Glucose-Capillary: 119 mg/dL — ABNORMAL HIGH (ref 65–99)
Glucose-Capillary: 120 mg/dL — ABNORMAL HIGH (ref 65–99)
Glucose-Capillary: 120 mg/dL — ABNORMAL HIGH (ref 65–99)
Glucose-Capillary: 121 mg/dL — ABNORMAL HIGH (ref 65–99)

## 2015-07-07 LAB — URINE CULTURE: Culture: >=100000 — AB

## 2015-07-07 MED ORDER — ATORVASTATIN CALCIUM 80 MG PO TABS: 80.0000 mg | ORAL_TABLET | Freq: Every day | ORAL | Status: DC

## 2015-07-07 MED ORDER — CIPROFLOXACIN HCL 500 MG PO TABS: 500.0000 mg | ORAL_TABLET | Freq: Two times a day (BID) | ORAL | Status: DC

## 2015-07-07 MED ORDER — ASPIRIN 81 MG PO TBEC: 81.0000 mg | DELAYED_RELEASE_TABLET | Freq: Every day | ORAL | Status: DC

## 2015-07-07 NOTE — Progress Notes (Signed)
STROKE TEAM PROGRESS NOTE   HISTORY OF PRESENT ILLNESS (per record) Michelle Holmes is an 65 y.o. female with a history of stroke in December 2016, hypertension, hyperlipidemia, diabetes mellitus, COPD and coronary artery disease, brought to the ED following an episode of loss of consciousness while undergoing outpatient rehabilitation. Episode occurred during a bout of nausea and vomiting. No seizure activity was described. Patient was noted to have slurred speech. She has persistent right hemiparesis from previous stroke which was not noticeably changed. She has been on aspirin and Plavix daily for antiplatelet therapy. CT scan of her head showed no acute intracranial abnormality. MRI showed a small non-hemorrhagic acute left parietal infarct. Patient has been sedated and difficult to arouse. She was given baclofen for spasticity starting at 10 mg twice a day on 07/01/2015 and reportedly took her first dose of baclofen this morning. She has been afebrile. Blood sugar was 135. Urine showed findings indicative of possible acute UTI. She was started on Rocephin. She was given naloxone 0.2 mg 2 with no apparent change in level of alertness.   LSN: Unclear tPA Given: No: No new focal deficits mRankin:   SUBJECTIVE (INTERVAL HISTORY) Her family is not at the bedside.  Overall she feels her condition is rapidly improving. She is anxious to leave for a family wedding and graduation.  No events overnight.  She states she is "all set up for O/P therapy and enjoys the therapy."   OBJECTIVE Temp:  [98.2 F (36.8 C)-98.7 F (37.1 C)] 98.2 F (36.8 C) (06/11 0503) Pulse Rate:  [58-65] 65 (06/11 0503) Cardiac Rhythm:  [-] Normal sinus rhythm (06/11 0700) Resp:  [18] 18 (06/11 0503) BP: (152-170)/(60-63) 152/60 mmHg (06/11 0503) SpO2:  [98 %-100 %] 98 % (06/11 0503)  CBC:  Recent Labs Lab 07/04/15 1528  07/05/15 0452 07/06/15 0523  WBC 8.0  < > 7.9 6.7  NEUTROABS 5.2  --   --  3.5  HGB 10.8*  < >  11.3* 9.8*  HCT 31.7*  < > 33.4* 29.7*  MCV 90.1  < > 90.5 90.0  PLT 203  < > 192 173  < > = values in this interval not displayed.  Basic Metabolic Panel:   Recent Labs Lab 07/05/15 0452 07/06/15 0523  NA 144 142  K 3.6 3.9  CL 112* 112*  CO2 23 22  GLUCOSE 76 130*  BUN 43* 41*  CREATININE 2.40* 2.56*  CALCIUM 9.3 9.0    Lipid Panel:     Component Value Date/Time   CHOL 156 07/05/2015 0452   TRIG 265* 07/05/2015 0452   HDL 25* 07/05/2015 0452   CHOLHDL 6.2 07/05/2015 0452   VLDL 53* 07/05/2015 0452   LDLCALC 78 07/05/2015 0452   HgbA1c:  Lab Results  Component Value Date   HGBA1C 7.1* 07/05/2015   Urine Drug Screen:     Component Value Date/Time   LABOPIA NONE DETECTED 07/04/2015 1610   COCAINSCRNUR NONE DETECTED 07/04/2015 1610   LABBENZ NONE DETECTED 07/04/2015 1610   AMPHETMU NONE DETECTED 07/04/2015 1610   THCU NONE DETECTED 07/04/2015 1610   LABBARB NONE DETECTED 07/04/2015 1610      IMAGING  Ct Head Wo Contrast 07/04/2015   Chronic ischemic change with no acute findings     Mr Brain Wo Contrast (neuro Protocol) 07/04/2015   Tiny acute nonhemorrhagic left periatrial infarct. Remainder findings appear chronic as detailed above.    Dg Chest Port 1 View 07/04/2015   No active disease.  Mr Jodene Nam Head/brain Wo Cm 07/04/2015   1. No large vessel occlusion within the intracranial circulation.  2. Severe atheromatous disease involving the posterior cerebral arteries bilaterally, with severe stenosis at the proximal right PCA, stable from prior.  3. Moderate atheromatous irregularity within the distal branches of the anterior circulation, also stable from prior.    PHYSICAL EXAM  HEENT- Normocephalic, no lesions, without obvious abnormality.Neck supple with no masses, nodes, nodules or enlargement. Cardiovascular - bradycardic, normal S1 and S2, no murmur Lungs - chest clear, no wheezing, rales, normal symmetric air entry Abdomen - soft,  non-tender; bowel sounds normal; no masses, no organomegaly Extremities - mild edema distal legs and feet  Neurologic Examination: Patient was wake and alert.  Ox3 with cues for the month She followed all commands.  Pupils were small and equal. Reactive to light.  Extraocular movements were intact.  Face was symmetrical at rest. Hearing grossly intact.  Good cough  Chronic right sided weakness with increased tone.  Wrist splint on right wrist.  Left side 4/5 throughout  Sensory to LT intact.  FNF intact on left  Gait Defered  ASSESSMENT/PLAN Michelle Holmes is a 65 y.o. female with history of  emphysema, hypertension, hyperlipidemia, diabetes mellitus, coronary artery disease, obesity, obstructive sleep apnea, previous TIA, congestive heart failure, previous stroke, previous MI, and renal insufficiency presenting with nausea, vomiting, slurred speech, lethargy, and loss of consciousness.  She did not receive IV t-PA due to resolution of deficits.  Stroke: Dominant infarct secondary to severe cerebrovascular disease.  Resultant resolution of deficits  MRI  Tiny acute nonhemorrhagic left periatrial infarct.   MRA  Severe atheromatous disease  Carotid Doppler - Right and left internal carotid arteries - no significant stenosis. Vertebral artery flow is antegrade.   2D Echo - pending  LDL - 78  HgbA1c - 7.1  VTE prophylaxis - Lovenox Diet heart healthy/carb modified Room service appropriate?: Yes; Fluid consistency:: Thin  aspirin 81 mg daily and clopidogrel 75 mg daily prior to admission, now on aspirin 325 mg daily and clopidogrel 75 mg daily  Patient counseled to be compliant with her antithrombotic medications  Ongoing aggressive stroke risk factor management  Therapy recommendations: Pending  Disposition:  Pending  Hypertension  Stable  Permissive hypertension (OK if < 220/120) but gradually normalize in 5-7 days  Long-term BP goal  normotensive  Hyperlipidemia  Home meds:  Lipitor 40 mg daily resumed in hospital  LDL 78, goal < 70  Increase Lipitor to 80 mg daily  Continue statin at discharge  Diabetes  HgbA1c 7.1, goal < 7.0  Uncontrolled  Other Stroke Risk Factors  Advanced age  Former cigarette smoker - quit  Obesity, There is no weight on file to calculate BMI., recommend weight loss, diet and exercise as appropriate   Hx stroke/TIA  Family hx stroke (aunt)  Coronary artery disease  Obstructive sleep apnea.   Other Active Problems  Anemia - hemoglobin 9.8 ; hematocrit 29.7  Kidney disease - BUN 41 ; Creatinine 2.56  UTI - Rocephin Day #2  Lethargy and loss of consciousness - question secondary to baclofen  ?    Hospital day # 3  Mikey Bussing [facility] Pager [phone] 07/07/2015, 10:46 AM  ATTENDING NOTE: Patient was seen and examined by me personally. Documentation reflects findings. The laboratory and radiographic studies reviewed by me. ROS completed by me personally and pertinent positives fully documented  Condition:  stable  Assessment and plan completed  by me personally and fully documented above. Plans/Recommendations include:     Stroke work-up nearly complete  Will need outpatient follow-up in 2 months when discharged   SIGNED BY: Dr. Elissa Hefty     To contact Stroke Continuity provider, please refer to [email or web address] After hours, contact General Neurology

## 2015-07-07 NOTE — Progress Notes (Signed)
Pt given discharge instructions, prescriptions, and care notes. Pt verbalized understanding AEB no further questions or concerns at this time. IV was discontinued, no redness, pain, or swelling noted at this time. Telemetry discontinued and Centralized Telemetry was notified. Pt left the floor via wheelchair with staff in stable condition. 

## 2015-07-07 NOTE — Discharge Summary (Signed)
Michelle Holmes, is a 65 y.o. female  DOB 10-12-1950  MRN [id number].  Admission date:  07/04/2015  Admitting Physician  Rise Patience, MD  Discharge Date:  07/07/2015   Primary MD  Ricke Hey, MD  Recommendations for primary care physician for things to follow:   Patient is being discharged home with instructions to follow-up with primary care physician. Patient will continue antibiotic therapy with ciprofloxacin. Patient will continue outpatient physical therapy.   Admission Diagnosis  Stroke (cerebrum) (HCC) [I63.9] Acute CVA (cerebrovascular accident) ([place]) [I63.9]   Discharge Diagnosis  Stroke (cerebrum) ([place]) [I63.9] Acute CVA (cerebrovascular accident) ([place]) [I63.9]   Urinary tract infection  Principal Problem:   Acute encephalopathy Active Problems:   Sleep apnea   CHF (congestive heart failure) (HCC)   CAD (coronary artery disease)   History of CVA with residual deficit   Chronic obstructive pulmonary disease (HCC)   Sinus bradycardia   Type 2 diabetes mellitus with vascular disease (HCC)   UTI (lower urinary tract infection)   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 Emphysema   . Colon cancer ([place])   . Hypertension   . Hypercholesteremia   . Diabetes mellitus   . COPD (chronic obstructive pulmonary disease) ([place])   . Coronary artery disease   . Obesity   . Sleep apnea   . TIA (transient ischemic attack)   . CHF (congestive heart failure) ([place])   . Anemia   . DJD (degenerative joint disease)   . Renal insufficiency   . Chronic back pain   . Scoliosis   . Obesity hypoventilation syndrome ([place])   . Stroke ([place])   . Myocardial infarction [facility])     Past Surgical History  Procedure Laterality Date  . Coronary angioplasty with  stent placement    . Colon surgery         HPI  from the history and physical done on the day of admission:   This is a 65 year old female presented to the hospital with altered status she is known to have an old CVA with right-sided hemiparesis, apparently she went unresponsive at home, she was noted to have slurred speech up on awakening. On initial physical examination her blood pressure was 0000000 systolic, heart rate was 46, respiration rate was 14 and oxygen saturation of 100%, she was found to be lethargic with minimal response to pain stimuli, her pupils pinpoint, her neck was supple, her lungs were clear to auscultation, heart S1-S2 present and rhythmic, abdomen soft nontender, lower extremities had no edema. Her venous pH was 7.3, her sodium was 143, her creatinine was 2.28, hemoglobin 10.8, white cell count 8.0. Her head CT showed chronic ischemic changes with no acute findings. A brain MRI showed tiny acute nonhemorrhagic left parietal. Her urinalysis had too numerous to count white cells, pH of 7.0 with negative nitrates.  Patient  was admitted to the hospital with the working diagnosis of loss of consciousness and encephalopathy related to acute CVA to rule out seizure, complicated by urinary tract infection.    Hospital Course:     1. Cardiovascular. Patient remained hemodynamically stable, her systolic blood pressure ranged between 148 and 170, signs of volume overload. Patient was treated with intravenous ceftriaxone with improvement of her symptoms her urinary culture grew enterococcus species which was sensitive to ampicillin and levofloxacin. Patient will complete her antibiotic therapy with ciprofloxacin, noted patient is allergic to penicillins. Patient will resume a amlodipine and metoprolol for blood pressure control.   2. Pulmonary. Patient had no signs of aspiration or volume overload. Patient had oximetry monitor and supplemental oxygen.  3. Nephrology. Patient kidney  function in stable creatinine 2.51, potassium remained within normal limits, patient will resume taking her furosemide as instructed prior to admission. Her calculated GFR is down to 20 consistent with a chronic kidney disease stage IV.  4. Neurology. Patient was diagnosed with small peritoneal infarct, nonischemic. Patient will be continued taking dual and dependent therapy with aspirin and Plavix, atorvastatin 80 milligrams daily, continue blood pressure and glucose control. Patient will resume her outpatient physical therapy. MRA shows severe atheromatous disease involving the posterior cerebral arteries bilaterally, with severe stenosis of the proximal right PCA stable from prior, as well as moderate atheromatous irregularity within the distal branches of the anterior circulation. Echocardiogram and carotid ultrasonography pending    Discharge Condition: Stable  Follow UP  Follow-up Information    Follow up with Ricke Hey, MD In 1 week.   Specialty:  Family Medicine   Contact information:   [place] [id number] [phone]        Consults obtained - Neurology  Diet and Activity recommendation: See Discharge Instructions below  Discharge Instructions     Discharge Instructions    Ambulatory referral to Neurology    Complete by:  As directed   Stroke office f/u in 1 month     Diet - low sodium heart healthy    Complete by:  As directed      Increase activity slowly    Complete by:  As directed              Discharge Medications       Medication List    STOP taking these medications        baclofen 10 MG tablet  Commonly known as:  LIORESAL      TAKE these medications        ADVAIR DISKUS 250-50 MCG/DOSE Aepb  Generic drug:  Fluticasone-Salmeterol  Inhale 1 puff into the lungs daily as needed (shortness of breath).     amLODipine 5 MG tablet  Commonly known as:  NORVASC  Take 5 mg by mouth at bedtime.     aspirin 81 MG EC  tablet  Take 1 tablet (81 mg total) by mouth daily.     atorvastatin 80 MG tablet  Commonly known as:  LIPITOR  Take 1 tablet (80 mg total) by mouth daily at 6 PM.     ciprofloxacin 500 MG tablet  Commonly known as:  CIPRO  Take 1 tablet (500 mg total) by mouth 2 (two) times daily.     clopidogrel 75 MG tablet  Commonly known as:  PLAVIX  Take 1 tablet (75 mg total) by mouth daily.     cyclobenzaprine 10 MG tablet  Commonly known as:  FLEXERIL  Take 10 mg by mouth 2 (two) times daily as needed (pain).     fenofibrate 54 MG tablet  Take 1 tablet (54 mg total) by mouth daily.     ferrous sulfate 325 (65 FE) MG tablet  Take 1 tablet (325 mg total) by mouth 2 (two) times daily with a meal.     furosemide 20 MG tablet  Commonly known as:  LASIX  Take 20 mg by mouth daily. Or as directed     glipiZIDE 5 MG tablet  Commonly known as:  GLUCOTROL  Take 1 tablet (5 mg total) by mouth daily before breakfast.     LANTUS SOLOSTAR 100 UNIT/ML Solostar Pen  Generic drug:  Insulin Glargine  Inject 30 Units into the skin at bedtime.     LYRICA 50 MG capsule  Generic drug:  pregabalin  Take 1 capsule (50 mg total) by mouth 2 (two) times daily.     metoprolol succinate 50 MG 24 hr tablet  Commonly known as:  TOPROL-XL  Take 50 mg by mouth daily. Take with or immediately following a meal.     Oxycodone HCl 20 MG Tabs  Take 1 tablet by mouth 3 (three) times daily.     pantoprazole 40 MG tablet  Commonly known as:  PROTONIX  Take 40 mg by mouth daily.        Major procedures and Radiology Reports - PLEASE review detailed and final reports for all details, in brief -      Ct Head Wo Contrast  07/04/2015  CLINICAL DATA:  Syncopal episode cough personal history of stroke, unresponsive EXAM: CT HEAD WITHOUT CONTRAST TECHNIQUE: Contiguous axial images were obtained from the base of the skull through the vertex without intravenous contrast. COMPARISON:  05/06/2015 MRI FINDINGS: Mild  diffuse cortical atrophy with moderate low attenuation in the deep white matter. Encephalomalacia left frontal parietal region stable from prior MRI. No evidence of acute vascular territory infarct or mass. No parenchymal hemorrhage or extra-axial fluid. Age-related basal ganglia calcification bilaterally. No hydrocephalus. Calvarium intact. IMPRESSION: Chronic ischemic change with no acute findings Electronically Signed   By: Skipper Cliche M.D.   On: 07/04/2015 16:02   Mr Brain Wo Contrast (neuro Protocol)  07/04/2015  CLINICAL DATA:  65 year old hypertensive diabetic female with high cholesterol. History of COPD. Colon cancer. Syncopal episode in wheelchair. Prior infarct with baseline right-sided weakness and slurred speech. Recent change in blood pressure medication. Subsequent encounter. EXAM: MRI HEAD WITHOUT CONTRAST TECHNIQUE: Multiplanar, multiecho pulse sequences of the brain and surrounding structures were obtained without intravenous contrast. COMPARISON:  07/04/2015 head CT.  05/06/2015 brain MR. FINDINGS: Exam is motion degraded.  Fast technique imaging had a be utilized. Tiny acute nonhemorrhagic left periatrial infarct. Remote small to moderate-size anterior left frontal lobe infarct with encephalomalacia. Remote posterior left lenticular nucleus infarct. Moderate chronic microvascular changes. Tiny area blood breakdown products right pons probably related to prior hemorrhagic ischemia. Artifact left supraorbital region. Wallerian degeneration with small left cerebral peduncle. Global atrophy without hydrocephalus. No intracranial mass lesion noted on this unenhanced exam. Ectatic vertebral arteries and basilar artery. Narrowing of the right vertebral artery may be present. Internal carotid arteries are patent. Exophthalmos.  Symmetric normal extra-ocular muscles. IMPRESSION: Tiny acute nonhemorrhagic left periatrial infarct. Remainder findings appear chronic as detailed above. Electronically  Signed   By: Genia Del M.D.   On: 07/04/2015 19:02   Dg Chest Port 1 View  07/04/2015  CLINICAL DATA:  Syncope. EXAM: PORTABLE CHEST  1 VIEW COMPARISON:  January 14, 2015 FINDINGS: Stable cardiomediastinal silhouette. No pneumothorax. No pulmonary nodules, masses, or focal infiltrates. Scoliotic changes in the spine again identified. IMPRESSION: No active disease. Electronically Signed   By: Dorise Bullion III M.D   On: 07/04/2015 15:42   Mr Jodene Nam Head/brain Wo Cm  07/04/2015  CLINICAL DATA:  Initial evaluation for acute stroke. EXAM: MRA HEAD WITHOUT CONTRAST TECHNIQUE: Angiographic images of the Circle of Willis were obtained using MRA technique without intravenous contrast. COMPARISON:  Previous brain MRI and head CT from earlier the same day. Comparison also made with prior MRA from 05/06/2015. FINDINGS: ANTERIOR CIRCULATION: Study degraded by motion artifact. Visualized distal cervical segments of the internal carotid arteries are tortuous but patent with antegrade flow. Petrous, cavernous, and supraclinoid segments are patent without flow limiting stenosis. A1 segments patent. Anterior communicating artery within normal limits. Anterior cerebral arteries well opacified to their distal aspects. Left ACA slightly attenuated as compared to the right, stable. M1 segments patent without stenosis or occlusion. MCA bifurcations normal. Distal small vessel atheromatous irregularity within the MCA branches bilaterally. POSTERIOR CIRCULATION: Vertebral arteries patent to the vertebrobasilar junction without flow-limiting stenosis. Left posterior inferior cerebral artery patent. Right posterior inferior cerebral artery not visualized on this exam. Basilar artery widely patent without basilar tip stenosis. Left superior cerebellar artery patent with atheromatous irregularity. Right superior cerebral artery is hypoplastic but grossly patent. Both posterior cerebral arteries arise from the basilar artery. There is a  severe stenosis at the proximal right PCA. Right PCA is attenuated distally. Severe atheromatous irregularity with stenoses present within the mid left PCA is well. No aneurysm or vascular malformation. IMPRESSION: 1. No large vessel occlusion within the intracranial circulation. 2. Severe atheromatous disease involving the posterior cerebral arteries bilaterally, with severe stenosis at the proximal right PCA, stable from prior. 3. Moderate atheromatous irregularity within the distal branches of the anterior circulation, also stable from prior. Electronically Signed   By: Jeannine Boga M.D.   On: 07/04/2015 23:10    Micro Results     Recent Results (from the past 240 hour(s))  Urine culture     Status: Abnormal   Collection Time: 07/04/15  4:09 PM  Result Value Ref Range Status   Specimen Description URINE, CLEAN CATCH  Final   Special Requests NONE  Final   Culture >=100,000 COLONIES/mL ENTEROCOCCUS SPECIES (A)  Final   Report Status 07/07/2015 FINAL  Final   Organism ID, Bacteria ENTEROCOCCUS SPECIES (A)  Final      Susceptibility   Enterococcus species - MIC*    AMPICILLIN <=2 SENSITIVE Sensitive     LEVOFLOXACIN 0.5 SENSITIVE Sensitive     NITROFURANTOIN <=16 SENSITIVE Sensitive     VANCOMYCIN 1 SENSITIVE Sensitive     * >=100,000 COLONIES/mL ENTEROCOCCUS SPECIES       Today   Subjective    Michelle Holmes patient is awake and alert, denies any shortness of breath or chest pain. Patient has had not had any fever or chills. Tolerating diet adequately.  Objective   Blood pressure 152/60, pulse 65, temperature 98.2 F (36.8 C), temperature source Oral, resp. rate 18, SpO2 98 %.   Intake/Output Summary (Last 24 hours) at 07/07/15 1056 Last data filed at 07/07/15 1022  Gross per 24 hour  Intake   1080 ml  Output    600 ml  Net    480 ml    Exam Gen. awake and alert Oral mucosa moist Neck supple Chest. Lungs  are clear to auscultation bilaterally no wheezing,  rales or rhonchi. Heart S1-S2 present in rhythmic no gallops, rubs, murmurs. Abdomen soft nontender Lower extremities nonpitting edema bilaterally Skin no rashes Neurologically patient does have a right upper extremity paresis is unchanged.   Data Review   CBC w Diff: Lab Results  Component Value Date   WBC 6.7 07/06/2015   WBC 11.8* 07/13/2005   HGB 9.8* 07/06/2015   HGB 11.9 07/13/2005   HCT 29.7* 07/06/2015   HCT 34.8 07/13/2005   PLT 173 07/06/2015   PLT 223 07/13/2005   LYMPHOPCT 36 07/06/2015   LYMPHOPCT 61.2* 07/13/2005   BANDSPCT 0 12/12/2010   MONOPCT 8 07/06/2015   MONOPCT 5.6 07/13/2005   EOSPCT 3 07/06/2015   EOSPCT 2.5 07/13/2005   BASOPCT 0 07/06/2015   BASOPCT 0.3 07/13/2005    CMP: Lab Results  Component Value Date   NA 142 07/06/2015   K 3.9 07/06/2015   CL 112* 07/06/2015   CO2 22 07/06/2015   BUN 41* 07/06/2015   CREATININE 2.56* 07/06/2015   PROT 5.9* 07/06/2015   ALBUMIN 3.1* 07/06/2015   BILITOT 0.6 07/06/2015   ALKPHOS 59 07/06/2015   AST 11* 07/06/2015   ALT 9* 07/06/2015  .   Total Time in preparing paper work, data evaluation and todays exam - 45 minutes  Tawni Millers M.D on 07/07/2015 at 10:56 AM  [facility]   Office  [phone]

## 2015-07-07 NOTE — Evaluation (Signed)
Occupational Therapy Evaluation Patient Details Name: ZAIRE OTANO MRN: [id number] DOB: [birth year] Today's Date: 07/07/2015    History of Present Illness 65 y.o. female admitted to [facility] on 07/04/15 for AMS.  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  Pt with significant PMhx of HTN, DM, COPD, CAD, obesity, TIA, CHF, anemia, renal insufficiency, chronic back pain/scoliosis, stroke, and MI.   Clinical Impression   Pt reports she required assist with ADLs and transfers to w/c PTA. Pt feels she has returned to her baseline and requires mod assist overall for ADLs and functional mobility with max assist provided for LB ADLs. Pt has limited AROM in her RUE from prior CVA and is currently being follow by outpatient neuro PT/OT; R UE splinted currently. Pt planning to d/c home today with 24/7 supervision/assist from family. Recommending return to outpatient OT for follow up; pt agreeable. Due to pt d/c home today; will defer all further OT needs to next venue. Please re-consult if needs change. Thank you for this referral.     Follow Up Recommendations  Outpatient OT;Supervision/Assistance - 24 hour (Return to outpatient neuro PT/OT)    Equipment Recommendations  None recommended by OT    Recommendations for Other Services       Precautions / Restrictions Precautions Precautions: Fall Precaution Comments: due to right hemi Restrictions Weight Bearing Restrictions: No      Mobility Bed Mobility Overal bed mobility: Needs Assistance Bed Mobility: Supine to Sit   Sidelying to sit: Mod assist       General bed mobility comments: Assist for advancing L LE to EOB and at trunk for coming from supine to sit.  Transfers Overall transfer level: Needs assistance Equipment used: None Transfers: Sit to/from [employer] Sit to Stand: Mod assist   Squat pivot transfers: Mod assist      General transfer comment: Mod assist to support at trunk and boost up from EOB x 2. Pt able to verbalize transfer to strong side.    Balance Overall balance assessment: Needs assistance Sitting-balance support: Feet supported;No upper extremity supported Sitting balance-Leahy Scale: Fair     Standing balance support: Single extremity supported Standing balance-Leahy Scale: Poor                              ADL Overall ADL's : Needs assistance/impaired Eating/Feeding: Set up;Sitting   Grooming: Min guard;Sitting   Upper Body Bathing: Moderate assistance;Sitting   Lower Body Bathing: Maximal assistance;Sit to/from stand   Upper Body Dressing : Moderate assistance;Sitting Upper Body Dressing Details (indicate cue type and reason): Pt able to verbalize R arm needs to go into clothing first. Lower Body Dressing: Maximal assistance;Sit to/from stand   Toilet Transfer: Moderate assistance;Squat-pivot;BSC Toilet Transfer Details (indicate cue type and reason): Simulated by squat pivot from EOB to chair Toileting- Clothing Manipulation and Hygiene: Maximal assistance;Sit to/from stand       Functional mobility during ADLs: Moderate assistance (for squat pivot only) General ADL Comments: Pt is very familiar with compensatory strategies for R hemi. Reports she feels she is at baseline with functional activities. Has strong family support at home and is comfortable d/c home with family assist and then returning to outpatient PT/OT.     Vision     Perception     Praxis      Pertinent Vitals/Pain Pain Assessment: No/denies pain  Hand Dominance Right (originally, but with dense R hemi)   Extremity/Trunk Assessment Upper Extremity Assessment Upper Extremity Assessment: RUE deficits/detail RUE Deficits / Details: at baseline R hemi.  Has R UE splint on from home.    Lower Extremity Assessment Lower Extremity Assessment: Defer to PT evaluation   Cervical /  Trunk Assessment Cervical / Trunk Assessment: Kyphotic   Communication Communication Communication: Expressive difficulties (mild, she is understandable)   Cognition Arousal/Alertness: Awake/alert Behavior During Therapy: WFL for tasks assessed/performed Overall Cognitive Status: History of cognitive impairments - at baseline (per PT eval)       Memory: Decreased short-term memory             General Comments       Exercises       Shoulder Instructions      Home Living Family/patient expects to be discharged to:: Private residence Living Arrangements: Other relatives (grandson lives with pt) Available Help at Discharge: Family;Available 24 hours/day Type of Home: House Home Access: Ramped entrance     Home Layout: One level     Bathroom Shower/Tub: Walk-in shower;Door   [employer] Toilet: Standard Bathroom Accessibility: Yes How Accessible: Accessible via wheelchair Home Equipment: Wheelchair - manual;Hospital bed;Walker - 2 wheels;Cane - single point;Bedside commode;Shower seat          Prior Functioning/Environment Level of Independence: Needs assistance  Gait / Transfers Assistance Needed: assist from family for mobility and transfers in and out of [place].  ADL's / Homemaking Assistance Needed: Assist to transfer in/out of shower, to bathe thoroughly, to get dressed, in/out of bed, and all IADLs. Pt reports on OT eval that she only sponge bathes.        OT Diagnosis: Generalized weakness;Cognitive deficits;Hemiplegia dominant side   OT Problem List:     OT Treatment/Interventions:      OT Goals(Current goals can be found in the care plan section) Acute Rehab OT Goals Patient Stated Goal: home today OT Goal Formulation: All assessment and education complete, DC therapy  OT Frequency:     Barriers to D/C:            Co-evaluation              End of Session Nurse Communication: Other (comment) (pt dressed and OOB in chair)  Activity  Tolerance: Patient tolerated treatment well Patient left: in chair;with call bell/phone within reach;with chair alarm set   Time: [id number] OT Time Calculation (min): 23 min Charges:  OT General Charges $OT Visit: 1 Procedure OT Evaluation $OT Eval Moderate Complexity: 1 Procedure OT Treatments $Self Care/Home Management : 8-22 mins G-Codes:     Binnie Kand M.S., OTR/L Pager: [phone]  07/07/2015, 2:00 PM

## 2015-07-07 NOTE — Discharge Instructions (Signed)
Please follow-up with primary care physician within one week.

## 2015-07-07 NOTE — Progress Notes (Signed)
STROKE TEAM PROGRESS NOTE   HISTORY OF PRESENT ILLNESS (per record) Michelle Holmes is an 65 y.o. female with a history of stroke in December 2016, hypertension, hyperlipidemia, diabetes mellitus, COPD and coronary artery disease, brought to the ED following an episode of loss of consciousness while undergoing outpatient rehabilitation. Episode occurred during a bout of nausea and vomiting. No seizure activity was described. Patient was noted to have slurred speech. She has persistent right hemiparesis from previous stroke which was not noticeably changed. She has been on aspirin and Plavix daily for antiplatelet therapy. CT scan of her head showed no acute intracranial abnormality. MRI showed a small non-hemorrhagic acute left parietal infarct. Patient has been sedated and difficult to arouse. She was given baclofen for spasticity starting at 10 mg twice a day on 07/01/2015 and reportedly took her first dose of baclofen this morning. She has been afebrile. Blood sugar was 135. Urine showed findings indicative of possible acute UTI. She was started on Rocephin. She was given naloxone 0.2 mg 2 with no apparent change in level of alertness.   LSN: Unclear tPA Given: No: No new focal deficits mRankin:   SUBJECTIVE (INTERVAL HISTORY) Her family is not at the bedside.  Overall she feels her condition is rapidly improving. She is anxious to leave for a family wedding and graduation.  No events overnight.  She states she is "all set up for O/P therapy and enjoys the therapy."   OBJECTIVE Temp:  [98.2 F (36.8 C)-98.7 F (37.1 C)] 98.2 F (36.8 C) (06/11 0503) Pulse Rate:  [58-65] 65 (06/11 0503) Cardiac Rhythm:  [-] Normal sinus rhythm (06/11 0700) Resp:  [18] 18 (06/11 0503) BP: (152-170)/(60-63) 152/60 mmHg (06/11 0503) SpO2:  [98 %-100 %] 98 % (06/11 0503)  CBC:  Recent Labs Lab 07/04/15 1528  07/05/15 0452 07/06/15 0523  WBC 8.0  < > 7.9 6.7  NEUTROABS 5.2  --   --  3.5  HGB 10.8*  < >  11.3* 9.8*  HCT 31.7*  < > 33.4* 29.7*  MCV 90.1  < > 90.5 90.0  PLT 203  < > 192 173  < > = values in this interval not displayed.  Basic Metabolic Panel:   Recent Labs Lab 07/05/15 0452 07/06/15 0523  NA 144 142  K 3.6 3.9  CL 112* 112*  CO2 23 22  GLUCOSE 76 130*  BUN 43* 41*  CREATININE 2.40* 2.56*  CALCIUM 9.3 9.0    Lipid Panel:     Component Value Date/Time   CHOL 156 07/05/2015 0452   TRIG 265* 07/05/2015 0452   HDL 25* 07/05/2015 0452   CHOLHDL 6.2 07/05/2015 0452   VLDL 53* 07/05/2015 0452   LDLCALC 78 07/05/2015 0452   HgbA1c:  Lab Results  Component Value Date   HGBA1C 7.1* 07/05/2015   Urine Drug Screen:     Component Value Date/Time   LABOPIA NONE DETECTED 07/04/2015 1610   COCAINSCRNUR NONE DETECTED 07/04/2015 1610   LABBENZ NONE DETECTED 07/04/2015 1610   AMPHETMU NONE DETECTED 07/04/2015 1610   THCU NONE DETECTED 07/04/2015 1610   LABBARB NONE DETECTED 07/04/2015 1610      IMAGING  Ct Head Wo Contrast 07/04/2015   Chronic ischemic change with no acute findings     Mr Brain Wo Contrast (neuro Protocol) 07/04/2015   Tiny acute nonhemorrhagic left periatrial infarct. Remainder findings appear chronic as detailed above.    Dg Chest Port 1 View 07/04/2015   No active disease.  Mr Michelle Holmes Head/brain Wo Cm 07/04/2015   1. No large vessel occlusion within the intracranial circulation.  2. Severe atheromatous disease involving the posterior cerebral arteries bilaterally, with severe stenosis at the proximal right PCA, stable from prior.  3. Moderate atheromatous irregularity within the distal branches of the anterior circulation, also stable from prior.     PHYSICAL EXAM  HEENT- Normocephalic, no lesions, without obvious abnormality.Neck supple with no masses, nodes, nodules or enlargement. Cardiovascular - bradycardic, normal S1 and S2, no murmur Lungs - chest clear, no wheezing, rales, normal symmetric air entry Abdomen - soft,  non-tender; bowel sounds normal; no masses, no organomegaly Extremities - mild edema distal legs and feet  Neurologic Examination: Patient was wake and alert.  Ox3 with cues for the month She followed all commands.  Pupils were small and equal. Reactive to light.  Extraocular movements were intact.  Face was symmetrical at rest. Hearing grossly intact.  Good cough  Chronic right sided weakness with increased tone.  Wrist splint on right wrist.  Left side 4/5 throughout  Sensory to LT intact.  FNF intact on left  Gait Defered   ASSESSMENT/PLAN Michelle Holmes is a 65 y.o. female with history of  emphysema, hypertension, hyperlipidemia, diabetes mellitus, coronary artery disease, obesity, obstructive sleep apnea, previous TIA, congestive heart failure, previous stroke, previous MI, and renal insufficiency presenting with nausea, vomiting, slurred speech, lethargy, and loss of consciousness.  She did not receive IV t-PA due to resolution of deficits.  Stroke: Dominant infarct secondary to severe cerebrovascular disease.  Resultant resolution of deficits  MRI  Tiny acute nonhemorrhagic left periatrial infarct.   MRA  Severe atheromatous disease  Carotid Doppler - Right and left internal carotid arteries - no significant stenosis. Vertebral artery flow is antegrade.   2D Echo - pending  LDL - 78  HgbA1c - 7.1  VTE prophylaxis - Lovenox Diet heart healthy/carb modified Room service appropriate?: Yes; Fluid consistency:: Thin Diet - low sodium heart healthy  aspirin 81 mg daily and clopidogrel 75 mg daily prior to admission, now on aspirin 325 mg daily and clopidogrel 75 mg daily  Patient counseled to be compliant with her antithrombotic medications  Ongoing aggressive stroke risk factor management  Therapy recommendations: Pending  Disposition:  Pending  Hypertension  Stable  Permissive hypertension (OK if < 220/120) but gradually normalize in 5-7  days  Long-term BP goal normotensive  Hyperlipidemia  Home meds:  Lipitor 40 mg daily resumed in hospital  LDL 78, goal < 70  Increase Lipitor to 80 mg daily  Continue statin at discharge  Diabetes  HgbA1c 7.1, goal < 7.0  Uncontrolled  Other Stroke Risk Factors  Advanced age  Former cigarette smoker - quit  Obesity, There is no weight on file to calculate BMI., recommend weight loss, diet and exercise as appropriate   Hx stroke/TIA  Family hx stroke (aunt)  Coronary artery disease  Obstructive sleep apnea.   Other Active Problems  Anemia - hemoglobin 9.8 ; hematocrit 29.7  Kidney disease - BUN 41 ; Creatinine 2.56  UTI - Rocephin Day #3  Lethargy and loss of consciousness - question secondary to baclofen  ?    Hospital day # 3  Mikey Bussing [facility] Pager [phone] 07/07/2015, 1:40 PM  ATTENDING NOTE: Patient was seen and examined by me personally. Documentation reflects findings. The laboratory and radiographic studies reviewed by me. ROS completed by me personally and pertinent positives fully documented  Condition:  stable  Assessment and plan completed by me personally and fully documented above. Plans/Recommendations include:     Stroke work-up nearly complete.  Apparently patient anxious to go home  Will need outpatient follow-up in 2 months when discharged  Will need close PCP follow-up prior in order to review remainder of work-up including ECHO and carotids   SIGNED BY: Dr. Elissa Hefty     To contact Stroke Continuity provider, please refer to [email or web address] After hours, contact General Neurology

## 2015-07-09 ENCOUNTER — Ambulatory Visit: Payer: 59 | Admitting: Physical Therapy

## 2015-07-09 ENCOUNTER — Encounter: Payer: Self-pay | Admitting: Physical Therapy

## 2015-07-09 ENCOUNTER — Ambulatory Visit: Payer: 59 | Admitting: Occupational Therapy

## 2015-07-09 DIAGNOSIS — M6289 Other specified disorders of muscle: Secondary | ICD-10-CM | POA: Diagnosis not present

## 2015-07-09 DIAGNOSIS — R2689 Other abnormalities of gait and mobility: Secondary | ICD-10-CM

## 2015-07-09 DIAGNOSIS — R278 Other lack of coordination: Secondary | ICD-10-CM | POA: Diagnosis not present

## 2015-07-09 DIAGNOSIS — M6281 Muscle weakness (generalized): Secondary | ICD-10-CM

## 2015-07-09 DIAGNOSIS — G8111 Spastic hemiplegia affecting right dominant side: Secondary | ICD-10-CM

## 2015-07-09 DIAGNOSIS — I69318 Other symptoms and signs involving cognitive functions following cerebral infarction: Secondary | ICD-10-CM | POA: Diagnosis not present

## 2015-07-09 DIAGNOSIS — R208 Other disturbances of skin sensation: Secondary | ICD-10-CM

## 2015-07-09 DIAGNOSIS — R41842 Visuospatial deficit: Secondary | ICD-10-CM

## 2015-07-09 DIAGNOSIS — M79601 Pain in right arm: Secondary | ICD-10-CM | POA: Diagnosis not present

## 2015-07-09 DIAGNOSIS — R293 Abnormal posture: Secondary | ICD-10-CM | POA: Diagnosis not present

## 2015-07-09 LAB — VAS US CAROTID
LEFT ECA DIAS: -1 cm/s
LEFT VERTEBRAL DIAS: 11 cm/s
Left CCA dist dias: -9 cm/s
Left CCA dist sys: -58 cm/s
Left CCA prox dias: 11 cm/s
Left CCA prox sys: 100 cm/s
Left ICA dist dias: -23 cm/s
Left ICA dist sys: -89 cm/s
Left ICA prox dias: 12 cm/s
Left ICA prox sys: 58 cm/s
RIGHT ECA DIAS: -12 cm/s
RIGHT VERTEBRAL DIAS: 9 cm/s
Right CCA prox dias: -6 cm/s
Right CCA prox sys: -67 cm/s
Right cca dist sys: -109 cm/s

## 2015-07-09 NOTE — Therapy (Signed)
[facility] [address] [place], [place], [address] Phone: [phone]   Fax:  [phone]  Occupational Therapy Treatment  Patient Details  Name: Michelle Holmes MRN: [id number] Date of Birth: [birth year] Referring Provider: Dr. Alysia Penna  Encounter Date: 07/09/2015      OT End of Session - 07/09/15 1547    Visit Number 10   Number of Visits 18  10+8   Date for OT Re-Evaluation 08/08/15   Authorization Type [employer] Medicare (no visit limit, no auth, G-code needed); medicaid 2nd   Authorization Time Period cert. 0/0/93-08/26/80, re-cert.  07/09/15-09/07/15   Authorization - Visit Number 2  G-code   Authorization - Number of Visits 10   OT Start Time 0940  arrived late   OT Stop Time 1018   OT Time Calculation (min) 38 min   Activity Tolerance Patient tolerated treatment well   Behavior During Therapy WFL for tasks assessed/performed      Past Medical History  Diagnosis Date  . Emphysema   . Colon cancer ([place])   . Hypertension   . Hypercholesteremia   . Diabetes mellitus   . COPD (chronic obstructive pulmonary disease) ([place])   . Coronary artery disease   . Obesity   . Sleep apnea   . TIA (transient ischemic attack)   . CHF (congestive heart failure) ([place])   . Anemia   . DJD (degenerative joint disease)   . Renal insufficiency   . Chronic back pain   . Scoliosis   . Obesity hypoventilation syndrome ([place])   . Stroke ([place])   . Myocardial infarction [facility])     Past Surgical History  Procedure Laterality Date  . Coronary angioplasty with stent placement    . Colon surgery      There were no vitals filed for this visit.      Subjective Assessment - 07/09/15 1023    Subjective  Pt reports that hand is not staying in splint at times.  Pt also reports that she was hospitalized after last session for "light CVA"  (see Epic for details).  Pt feels like she is at baseline from prior to hospitalization.   Pertinent History CVA 01/13/16, hx of previous CVA with risidual R-sided weakness, hospitalization 05/05/15 with possible TIA or seizure disorder; also hx of HTN, MI, CHF, CAD, DM, COPD, emphysema, sleep apnea, chronic back pain, scoliosis, DJD, renal insufficiency, anemia, and colon cancer.   Limitations cognitive deficits, inconsistent participation   Patient Stated Goals get back to "all that I did before"; when questioned pt stated that she wanted to work on toileting transfer ( but inconsistent responses)   Currently in Pain? No/denies       Splinting:  Adjusted custom resting hand splint for improved fit by trimming down sides of hand piece to allow straps to be fastened tighter.  Also adjusted strapping of thumb.  Reviewed with pt to make sure that straps are secured and that webspace is all the way in splint.  Also reviewed wear time.  Pt verbalized understanding.  Neuro re-ed:   In sitting, lateral wt. Shifts to the right for incr awareness and in prep for functional mobility with min cueing.  Then trunk rotation with UEs clasped for incr shoulder/trunk mobility with min facilitation, mod prompts.  Wt. Bearing through RUE on elbow with push to sitting for incr core stability with min facilitation/cueing.  AAROM shoulder flex/elbow ext.  Wt. Bearing through R hand on mat with gentle body  on arm movements with min-mod facilitation/cues.   Attempting to maintain grasp on cylinder object with min-mod prompts.  No functional decline in RUE since recent CVA 07/04/15.   ADLs:  Reviewed status (pt appears at baseline) and current goals.  Pt verbalized understanding.  Transfer w/c>mat with mod A.                         OT Education - 07/09/15 1543    Education Details reviewed splint wearing instructions   Person(s) Educated Patient   Methods Explanation   Comprehension Verbalized understanding             OT Long Term Goals - 07/09/15 1558    OT LONG TERM GOAL #1    Title Pt will perform self PROM HEP with mod prompts/cueing.--due 08/07/15   Status On-going  07/09/15 partially met   OT LONG TERM GOAL #2   Title Pt will participate in functional activities for at least 80mn with mod prompts.   Status Achieved   OT LONG TERM GOAL #3   Title Pt will tolerate/allow gentle PROM to RUE at least 75% of the time.   Status Achieved   OT LONG TERM GOAL #4   Title [facility]   Status Achieved   OT LONG TERM GOAL #5   Title Patient will don / doff pull over shirt with no greater than mod assist ( due 08/07/15)   Time 4   Period Weeks   Status New   OT LONG TERM GOAL #6   Title Patient will complete toilet transfer with min assist  (due 08/07/15)   Time 4   Period Weeks   Status New               Plan - 07/09/15 1552    Clinical Impression Statement Pt was hospitalized 07/04/15-07/07/15 for AMS. Pt dx with UTI and new small left parietal non hemorrhagic infarct and acute encepholopathy.  However, pt appears to be back to baseline and current plan of care remains appropriate.  Pt has progressed with improved participation in therapy and improved functional mobility as well as improved tolerance with RUE movement since initial OT evaluation.  Pt would benefit from continued occupational therapy to improve participation in ADLs.   Rehab Potential Fair   Clinical Impairments Affecting Rehab Potential no caregiver support (during rehab affecting carryover)   OT Frequency 2x / week   OT Duration 4 weeks   OT Treatment/Interventions Self-care/ADL training;Therapeutic exercise;Functional Mobility Training;Patient/family education;Balance training;Splinting;Manual Therapy;Neuromuscular education;Ultrasound;Energy conservation;Manual lymph drainage;Therapeutic exercises;Therapeutic activities;DME and/or AE instruction;Parrafin;Cryotherapy;Electrical Stimulation;Fluidtherapy;Cognitive remediation/compensation;Visual/perceptual  remediation/compensation;Passive range of motion;Contrast Bath;Moist Heat   Plan re-cert sent to physician due to recent hospitalization; neuro re-ed, [facility] to [facility] Upper body dressing   OT Home Exercise Plan Education provided:  edema massage, splint wear/care   Consulted and Agree with Plan of Care Patient      Patient will benefit from skilled therapeutic intervention in order to improve the following deficits and impairments:  Decreased activity tolerance, Decreased balance, Decreased cognition, Decreased coordination, Decreased endurance, Decreased range of motion, Decreased mobility, Decreased knowledge of use of DME, Decreased knowledge of precautions, Decreased strength, Difficulty walking, Increased edema, Impaired perceived functional ability, Impaired flexibility, Obesity, Impaired UE functional use, Impaired tone, Pain, Impaired vision/preception, Improper body mechanics  Visit Diagnosis: Spastic hemiplegia affecting right dominant side (HCC)  Muscle weakness (generalized)  Other lack of coordination  Other abnormalities of gait and mobility  Visuospatial deficit  Other disturbances of skin sensation    Problem List Patient Active Problem List   Diagnosis Date Noted  . Chronic kidney disease (CKD), stage IV (severe) ([place]) 07/06/2015  . Acute CVA (cerebrovascular accident) ([place])   . Acute encephalopathy 07/04/2015  . Sinus bradycardia 07/04/2015  . Type 2 diabetes mellitus with vascular disease ([place]) 07/04/2015  . UTI (lower urinary tract infection) 07/04/2015  . Parietal lobe infarction ([place]) 07/04/2015  . Spastic hemiplegia affecting nondominant side ([place]) 07/01/2015  . Obesity, morbid ([place]) 07/01/2015  . Absolute anemia   . Thrombocytopenia ([place])   . Acute ischemic VBA thalamic stroke ([place]) 01/18/2015  . AKI (acute kidney injury) ([place])   . Dysarthria   . Lethargy   . DM type 2 with diabetic peripheral neuropathy ([place])   . Labile blood pressure   .  Hyperlipidemia   . History of CVA with residual deficit   . Chronic obstructive pulmonary disease ([facility])   . Hemiparesis, aphasia, and dysphagia as late effect of cerebrovascular accident (CVA) ([place])   . Acute ischemic stroke ([place])   . CVA (cerebral vascular accident) ([place]) 01/14/2015  . CVA (cerebral infarction) 01/14/2015  . Right hemiplegia ([place])   . Right sided weakness 01/13/2015  . Abdominal pain 06/20/2014  . Altered mental status 02/08/2012  . Sleep apnea 12/08/2010  . Morbid obesity ([place]) 12/08/2010  . CHF (congestive heart failure) ([place]) 12/08/2010  . Chronic renal failure 12/08/2010  . TIA (transient ischemic attack) 12/08/2010  . CAD (coronary artery disease) 12/08/2010  . Cellulitis of pubic region 12/01/2010  . Uncontrolled type 2 DM with hyperosmolar nonketotic hyperglycemia ([place]) 12/01/2010    [facility] 07/09/2015, 4:00 PM  [facility] [address] [place], [place], [address] Phone: [phone]   Fax:  [phone]  Name: ALAZAY LEICHT MRN: [id number] Date of Birth: 07-06-1950  Vianne Bulls, OTR/L [facility] [address] [place], [place]  [address] [phone] phone [phone] 07/09/2015 4:00 PM

## 2015-07-10 NOTE — Therapy (Signed)
[facility] [address] [place], [place], [address] Phone: [phone]   Fax:  [phone]  Physical Therapy Treatment  Patient Details  Name: Michelle Holmes MRN: [id number] Date of Birth: 1951/01/11 Referring Provider: Alysia Penna, MD  Encounter Date: 07/09/2015      PT End of Session - 07/09/15 1630    Visit Number 15   Number of Visits 21  requesting 8 additional PT visits   Date for PT Re-Evaluation 08/27/15   Authorization Type UHC MCR - G Codes and PN required every 10 visits   PT Start Time 1015   PT Stop Time 1100   PT Time Calculation (min) 45 min   Equipment Utilized During Treatment Gait belt   Activity Tolerance Patient tolerated treatment well   Behavior During Therapy [facility] for tasks assessed/performed      Past Medical History  Diagnosis Date  . Emphysema   . Colon cancer ([place])   . Hypertension   . Hypercholesteremia   . Diabetes mellitus   . COPD (chronic obstructive pulmonary disease) ([place])   . Coronary artery disease   . Obesity   . Sleep apnea   . TIA (transient ischemic attack)   . CHF (congestive heart failure) ([place])   . Anemia   . DJD (degenerative joint disease)   . Renal insufficiency   . Chronic back pain   . Scoliosis   . Obesity hypoventilation syndrome ([place])   . Stroke ([place])   . Myocardial infarction [facility])     Past Surgical History  Procedure Laterality Date  . Coronary angioplasty with stent placement    . Colon surgery      There were no vitals filed for this visit.      Subjective Assessment - 07/09/15 1022    Subjective No new complaints. No falls or pain to report. Pt was admitted to hospital recently   Pertinent History PMH significant for: CVA x2 (most recent: 01/13/15), HTN, HLD, CHF, COPD, OSA, CAD, DM II, colon cancer, DJD, scoliosis, chronic renal insufficiency   Patient Stated Goals "I want a new wheelchair and I want to walk."              [facility] Adult PT Treatment/Exercise - 07/09/15 1031    Transfers   Transfers Sit to Stand;Stand to Sit   Sit to Stand 4: Min assist;3: Mod assist;With upper extremity assist;From bed;From chair/3-in-1   Sit to Stand Details Tactile cues for placement;Tactile cues for weight shifting;Tactile cues for initiation   Sit to Stand Details (indicate cue type and reason) x 4 reps with cues/assist for weight shifting, foot placement and left UE use   Stand to Sit 4: Min assist;3: Mod assist;With upper extremity assist;To bed;To chair/3-in-1;With armrests;Uncontrolled descent   Stand to Sit Details (indicate cue type and reason) Verbal cues for technique;Tactile cues for weight beaing;Tactile cues for weight shifting   Stand to Sit Details cues/assist to control descent with sitting down, to reach back with left UE and on weight shifting   Stand Pivot Transfers 3: Mod assist   Stand Pivot Transfer Details (indicate cue type and reason) wheelchair<>level mat   Comments during 3 standing trials: weight shifting left<>right, anterior<>posterior, working on tall posture in midline left foot stepping fwd/bwd   Ambulation/Gait   Ambulation/Gait Yes   Ambulation/Gait Assistance 3: Mod assist;Other (comment);4: Min assist  of 2 people   Ambulation/Gait Assistance Details using short KI on right leg to assist with knee stability,  assist needed to advance right leg and for step placement. assist/cues/facilitation for posture, weight shifting and walker control/position with gait                  Ambulation Distance (Feet) 22 Feet   Assistive device Right platform walker  right short KI   Gait Pattern Decreased hip/knee flexion - right;Poor foot clearance - right;Scissoring;Decreased weight shift to right;Decreased dorsiflexion - right;Decreased stride length;Decreased step length - left;Right flexed knee in stance   Ambulation Surface Level;Indoor             PT Short Term Goals - 06/28/15 1804    PT SHORT  TERM GOAL #1   Title Pt will demonstrate consistent carryover of logroll technique for supine <> sit for R-side WB, increased efficiency of movement, and increased independence with bed mobility.   (Target date: 05/29/15)   Baseline Met 6/2.   Status Achieved   PT SHORT TERM GOAL #2   Title Pt will perform supine <> sit with supervision, min cueing to indicate increased independence getting into/out of bed.   Baseline 6/2: Min A required.   Status Partially Met   PT SHORT TERM GOAL #3   Title Pt will perform level transfers from w/c <> mat table in B directions with min A, min cueing to indicate increased independence with transfers.   Baseline 6/2: squat pivot min A to L, mod A to R. Requires mod A for stand pivot due to R knee instability.  Will continue as LTG.   Status Partially Met   PT SHORT TERM GOAL #4   Title Pt will perform sit <> stand from w/c with min A, min cueing with LRAD to indicate increased pt independence with functional transfers.   Baseline Met 6/2.   Status Achieved   PT SHORT TERM GOAL #5   Title Pt will perform dynamic standing task x2 consecutive minutes with single UE support and min to indicate improved postural awareness/control with functional standing.   Baseline 6/2: Deferred until R AFO received due to R knee instability in static/dynamic standing.    Status Deferred   PT SHORT TERM GOAL #6   Title ATP will assess current w/c to assist in determining if pt appropriate candidate for custom w/c to improve positioning, preserve skin integrity, and increase pt independence with mobility.    Baseline Met 4/25.   Status Achieved   PT SHORT TERM GOAL #7   Title Pt will improve PASS score from 12/36 to 15/36 to indicate improved independence iwth funcitonal mobility.   Baseline 6/2: PASS score = 18/36.   Status Achieved           PT Long Term Goals - 07/04/15 1626    PT LONG TERM GOAL #1   Title Pt will consistently perform supine <> sit with supervision,  min cueing to indicate increased independence getting into/out of bed.  (Modified target date: 07/26/15)   Baseline 6/2: REVISED from mod I to supervision, cueing due to R inattention.  Continue goal through renewed POC.   Status Revised   PT LONG TERM GOAL #2   Title Pt will perform transfers from w/c <> mat table in B directions with CGA to indicate increased independence with transfers. (Modified target date: 07/26/15)   Baseline 6/2: squat pivot min A to L, mod A to R. Requires mod A for stand pivot due to R knee instability.   REVISED from supervision to CGA due to R knee instability.  Continue goal through renewed POC.   Status Partially Met   PT LONG TERM GOAL #3   Title Pt will perform unlevel transfers in B directions with min A, min cueing for increased safety/independence with functional transfers.   Baseline 6/2: Deferred; LTG #2 revised to focus on all transfers.   Status Deferred   PT LONG TERM GOAL #4   Title Pt will perform sit <> stand from w/c with supervision using LRAD to indicate increased pt independence with functional   (Modified target date: 07/26/15)   Baseline 06/25/15: pt is inconsistent in assistance needed, ranging from min guard assist to mod assist.  6/2: REVISED to add LRAD.  Continue goal through renewed POC.   Status Not Met   PT LONG TERM GOAL #5   Title Pt will improve PASS score from 12/36 to 18/36 to indicate significantly improved independence with functional mobility.   Baseline Achieved initial PASS goal for 18/36. Defer revised goal (for score of 22/36) due to safety concerns with dynamic standing until R AFO received.   Status Achieved   PT LONG TERM GOAL #6   Title Will attempt ambulation and set goal, if appropriate, to assess if safe/functional to address short-distance household ambulation.   Baseline 06/25/15: gait initiated in parallel bars for a least 2 sessions now. pt does need assistance at this time. ? need for orthotic on right leg due to  knee buckling.. PT to further assess and set goals.   Status Achieved   PT LONG TERM GOAL #7   Title Orthotist will assess patient appropriateness for R AFO to promote R knee stability during transfers and gait.  (Target date: 07/26/15)   Status On-going   PT LONG TERM GOAL #8   Title Pt will ambulate x15' with mod A using LRAD to initiate short-distance household ambulation, progress toward PLOF.   (Target date: 07/26/15)   Status On-going             Plan - 07/09/15 1630    Clinical Impression Statement Today's session continued to address mobility and gait. No significant changes noted from previous session due to pt's recent hospitilization. Discussed with primary PT and current plan of care/goals remain appropriate.    Rehab Potential Good   Clinical Impairments Affecting Rehab Potential cognitive impairments; limited family support; relies on friend for transportation   PT Frequency 2x / week   PT Duration 4 weeks   PT Treatment/Interventions ADLs/Self Care Home Management;DME Instruction;Electrical Stimulation;Vestibular;Gait training;Neuromuscular re-education;Stair training;Functional mobility training;Therapeutic activities;Patient/family education;Therapeutic exercise;Balance training;Prosthetic Training;Orthotic Fit/Training;Wheelchair mobility training   PT Next Visit Plan continue gait training/ standing as tolerated   Consulted and Agree with Plan of Care Patient      Patient will benefit from skilled therapeutic intervention in order to improve the following deficits and impairments:  Postural dysfunction, Impaired tone, Abnormal gait, Decreased balance, Decreased activity tolerance, Decreased endurance, Decreased coordination, Decreased mobility, Impaired perceived functional ability, Impaired sensation, Decreased knowledge of use of DME, Decreased strength, Increased edema, Impaired vision/preception, Decreased cognition  Visit Diagnosis: Other abnormalities of gait and  mobility  Muscle weakness (generalized)  Other lack of coordination     Problem List Patient Active Problem List   Diagnosis Date Noted  . Chronic kidney disease (CKD), stage IV (severe) ([place]) 07/06/2015  . Acute CVA (cerebrovascular accident) ([place])   . Acute encephalopathy 07/04/2015  . Sinus bradycardia 07/04/2015  . Type 2 diabetes mellitus with vascular disease ([place]) 07/04/2015  . UTI (lower urinary tract  infection) 07/04/2015  . Parietal lobe infarction ([place]) 07/04/2015  . Spastic hemiplegia affecting nondominant side ([employer]) 07/01/2015  . Obesity, morbid ([place]) 07/01/2015  . Absolute anemia   . Thrombocytopenia ([place])   . Acute ischemic VBA thalamic stroke ([place]) 01/18/2015  . AKI (acute kidney injury) ([place])   . Dysarthria   . Lethargy   . DM type 2 with diabetic peripheral neuropathy ([place])   . Labile blood pressure   . Hyperlipidemia   . History of CVA with residual deficit   . Chronic obstructive pulmonary disease ([place])   . Hemiparesis, aphasia, and dysphagia as late effect of cerebrovascular accident (CVA) ([place])   . Acute ischemic stroke ([place])   . CVA (cerebral vascular accident) ([place]) 01/14/2015  . CVA (cerebral infarction) 01/14/2015  . Right hemiplegia ([place])   . Right sided weakness 01/13/2015  . Abdominal pain 06/20/2014  . Altered mental status 02/08/2012  . Sleep apnea 12/08/2010  . Morbid obesity ([place]) 12/08/2010  . CHF (congestive heart failure) ([place]) 12/08/2010  . Chronic renal failure 12/08/2010  . TIA (transient ischemic attack) 12/08/2010  . CAD (coronary artery disease) 12/08/2010  . Cellulitis of pubic region 12/01/2010  . Uncontrolled type 2 DM with hyperosmolar nonketotic hyperglycemia ([address]) 12/01/2010    Willow Ora, PTA, [facility] [address] [place], [place] [address] [phone] 07/10/2015, 9:30 AM   Name: Michelle Holmes MRN: [id number] Date of Birth: August 18, 1950

## 2015-07-11 ENCOUNTER — Ambulatory Visit: Payer: 59 | Admitting: Occupational Therapy

## 2015-07-11 ENCOUNTER — Encounter: Payer: Self-pay | Admitting: Physical Therapy

## 2015-07-11 ENCOUNTER — Ambulatory Visit: Payer: 59 | Admitting: Physical Therapy

## 2015-07-11 DIAGNOSIS — R2689 Other abnormalities of gait and mobility: Secondary | ICD-10-CM

## 2015-07-11 DIAGNOSIS — G8111 Spastic hemiplegia affecting right dominant side: Secondary | ICD-10-CM

## 2015-07-11 DIAGNOSIS — M6281 Muscle weakness (generalized): Secondary | ICD-10-CM

## 2015-07-11 DIAGNOSIS — E119 Type 2 diabetes mellitus without complications: Secondary | ICD-10-CM | POA: Diagnosis not present

## 2015-07-11 DIAGNOSIS — R278 Other lack of coordination: Secondary | ICD-10-CM | POA: Diagnosis not present

## 2015-07-11 DIAGNOSIS — R208 Other disturbances of skin sensation: Secondary | ICD-10-CM | POA: Diagnosis not present

## 2015-07-11 DIAGNOSIS — I69318 Other symptoms and signs involving cognitive functions following cerebral infarction: Secondary | ICD-10-CM | POA: Diagnosis not present

## 2015-07-11 DIAGNOSIS — I10 Essential (primary) hypertension: Secondary | ICD-10-CM | POA: Diagnosis not present

## 2015-07-11 DIAGNOSIS — R293 Abnormal posture: Secondary | ICD-10-CM | POA: Diagnosis not present

## 2015-07-11 DIAGNOSIS — R402431 Glasgow coma scale score 3-8, in the field [EMT or ambulance]: Secondary | ICD-10-CM | POA: Diagnosis not present

## 2015-07-11 DIAGNOSIS — M79601 Pain in right arm: Secondary | ICD-10-CM | POA: Diagnosis not present

## 2015-07-11 DIAGNOSIS — M545 Low back pain: Secondary | ICD-10-CM | POA: Diagnosis not present

## 2015-07-11 DIAGNOSIS — M6289 Other specified disorders of muscle: Secondary | ICD-10-CM | POA: Diagnosis not present

## 2015-07-11 NOTE — Patient Instructions (Signed)
Items Kaydi needs to address:  1. Gait belt- can be bought most cost effectively at [employer] supply of [place] in [place].  2. Did rehab sent home a brace for the right leg?? If so where is it?? Bring it next session so to address if it is still good for walking.  3. MD appt today 07/11/15- need face to face sign off on power wheelchair before insurance will authorize it. Let Dr. Alyson Ingles know this so he can do the needed paperwork today so [facility] can issue new wheelchair. They can not do so until the Md does this.

## 2015-07-11 NOTE — Therapy (Signed)
[facility] [address] [place], [place], [address] Phone: [phone]   Fax:  [phone]  Occupational Therapy Treatment  Patient Details  Name: Michelle Holmes MRN: [id number] Date of Birth: 1950/11/01 Referring Provider: Dr. Alysia Penna  Encounter Date: 07/11/2015      OT End of Session - 07/11/15 1044    Visit Number 11   Number of Visits 18  10+8   Date for OT Re-Evaluation 08/08/15   Authorization Type UHC Medicare (no visit limit, no auth, G-code needed); medicaid 2nd   Authorization Time Period cert. 04/01/88-03/02/07, re-cert.  07/09/15-09/07/15   Authorization - Visit Number 3  G-code   Authorization - Number of Visits 10   OT Start Time 0940   OT Stop Time 1018   OT Time Calculation (min) 38 min   Activity Tolerance Patient tolerated treatment well   Behavior During Therapy WFL for tasks assessed/performed      Past Medical History  Diagnosis Date  . Emphysema   . Colon cancer ([place])   . Hypertension   . Hypercholesteremia   . Diabetes mellitus   . COPD (chronic obstructive pulmonary disease) ([place])   . Coronary artery disease   . Obesity   . Sleep apnea   . TIA (transient ischemic attack)   . CHF (congestive heart failure) ([place])   . Anemia   . DJD (degenerative joint disease)   . Renal insufficiency   . Chronic back pain   . Scoliosis   . Obesity hypoventilation syndrome ([place])   . Stroke ([place])   . Myocardial infarction [facility])     Past Surgical History  Procedure Laterality Date  . Coronary angioplasty with stent placement    . Colon surgery      There were no vitals filed for this visit.      Subjective Assessment - 07/11/15 1043    Subjective  "I just want to walk so bad"  Pt tearful at times today.   Pertinent History CVA 01/13/16, hx of previous CVA with risidual R-sided weakness, hospitalization 05/05/15 with possible TIA or seizure disorder; also hx of HTN, MI, CHF, CAD, DM, COPD,  emphysema, sleep apnea, chronic back pain, scoliosis, DJD, renal insufficiency, anemia, and colon cancer.   Limitations cognitive deficits, inconsistent participation   Patient Stated Goals get back to "all that I did before"; when questioned pt stated that she wanted to work on toileting transfer ( but inconsistent responses)   Currently in Pain? No/denies                      OT Treatments/Exercises (OP) - 07/11/15 1304    ADLs   UB Dressing donning/doffing pull-over shirt with min-mod A, prompts/encouragement.    Toileting Transfer from w/c>BSC x2 going both toward weaker and stronger side.  Pt with min-mod A each direction with mod cueing to scoot and help to get R foot positioned correctly (squat pivot).  Pt needed min encouragement/prompts to participate.   Neurological Re-education Exercises   Shoulder Flexion Seated;Self ROM  tableslides with mod faciliation/cues   Other Exercises 1 Reach for the floor stretch for elbow extension with min cueing/facilitation.   Manual Therapy   Manual Therapy Passive ROM   Passive ROM Gentle PROM L wrist for extension within pt tolerance.                     OT Long Term Goals - 07/09/15 1558  OT LONG TERM GOAL #1   Title Pt will perform self PROM HEP with mod prompts/cueing.--due 08/07/15   Status On-going  07/09/15 partially met   OT LONG TERM GOAL #2   Title Pt will participate in functional activities for at least 37mn with mod prompts.   Status Achieved   OT LONG TERM GOAL #3   Title Pt will tolerate/allow gentle PROM to RUE at least 75% of the time.   Status Achieved   OT LONG TERM GOAL #4   Title Pt [facility]   Status Achieved   OT LONG TERM GOAL #5   Title Patient will don / doff pull over shirt with no greater than mod assist ( due 08/07/15)   Time 4   Period Weeks   Status New   OT LONG TERM GOAL #6   Title Patient will complete toilet transfer with min assist   (due 08/07/15)   Time 4   Period Weeks   Status New               Plan - 07/11/15 1046    Clinical Impression Statement Pt progressing towards goals with improving functional mobility and ADLs.  Pt needed incr encouragement today and was tearful during session.   Rehab Potential Fair   Clinical Impairments Affecting Rehab Potential no caregiver support (during rehab affecting carryover)   OT Frequency 2x / week   OT Duration 4 weeks   OT Treatment/Interventions Self-care/ADL training;Therapeutic exercise;Functional Mobility Training;Patient/family education;Balance training;Splinting;Manual Therapy;Neuromuscular education;Ultrasound;Energy conservation;Manual lymph drainage;Therapeutic exercises;Therapeutic activities;DME and/or AE instruction;Parrafin;Cryotherapy;Electrical Stimulation;Fluidtherapy;Cognitive remediation/compensation;Visual/perceptual remediation/compensation;Passive range of motion;Contrast Bath;Moist Heat   Plan neuro re-ed, transfers   OT Home Exercise Plan Education provided:  edema massage, splint wear/care   Consulted and Agree with Plan of Care Patient      Patient will benefit from skilled therapeutic intervention in order to improve the following deficits and impairments:  Decreased activity tolerance, Decreased balance, Decreased cognition, Decreased coordination, Decreased endurance, Decreased range of motion, Decreased mobility, Decreased knowledge of use of DME, Decreased knowledge of precautions, Decreased strength, Difficulty walking, Increased edema, Impaired perceived functional ability, Impaired flexibility, Obesity, Impaired UE functional use, Impaired tone, Pain, Impaired vision/preception, Improper body mechanics  Visit Diagnosis: Muscle weakness (generalized)  Spastic hemiplegia affecting right dominant side (HCC)  Other lack of coordination  Other abnormalities of gait and mobility    Problem List Patient Active Problem List    Diagnosis Date Noted  . Chronic kidney disease (CKD), stage IV (severe) ([place] 07/06/2015  . Acute CVA (cerebrovascular accident) ([place]   . Acute encephalopathy 07/04/2015  . Sinus bradycardia 07/04/2015  . Type 2 diabetes mellitus with vascular disease ([place] 07/04/2015  . UTI (lower urinary tract infection) 07/04/2015  . Parietal lobe infarction ([place] 07/04/2015  . Spastic hemiplegia affecting nondominant side ([place] 07/01/2015  . Obesity, morbid ([place] 07/01/2015  . Absolute anemia   . Thrombocytopenia ([place]   . Acute ischemic VBA thalamic stroke ([place] 01/18/2015  . AKI (acute kidney injury) ([place]   . Dysarthria   . Lethargy   . DM type 2 with diabetic peripheral neuropathy ([place]   . Labile blood pressure   . Hyperlipidemia   . History of CVA with residual deficit   . Chronic obstructive pulmonary disease ([place]   . Hemiparesis, aphasia, and dysphagia as late effect of cerebrovascular accident (CVA) ([place]   . Acute ischemic stroke ([place]   . CVA (cerebral vascular accident) ([place]  01/14/2015  . CVA (cerebral infarction) 01/14/2015  . Right hemiplegia ([place])   . Right sided weakness 01/13/2015  . Abdominal pain 06/20/2014  . Altered mental status 02/08/2012  . Sleep apnea 12/08/2010  . Morbid obesity ([place]) 12/08/2010  . CHF (congestive heart failure) ([place]) 12/08/2010  . Chronic renal failure 12/08/2010  . TIA (transient ischemic attack) 12/08/2010  . CAD (coronary artery disease) 12/08/2010  . Cellulitis of pubic region 12/01/2010  . Uncontrolled type 2 DM with hyperosmolar nonketotic hyperglycemia ([place]) 12/01/2010    [facility] 07/11/2015, 1:13 PM  [facility] [address] [place], [place], [address] Phone: [phone]   Fax:  [phone]  Name: Michelle Holmes MRN: [id number] Date of Birth: 1950-04-01  Vianne Bulls, OTR/L [facility] [address] [place], [place]   [address] [phone] phone [phone] 07/11/2015 1:13 PM

## 2015-07-12 ENCOUNTER — Emergency Department (HOSPITAL_COMMUNITY): Payer: Medicare Other

## 2015-07-12 ENCOUNTER — Encounter (HOSPITAL_COMMUNITY): Payer: Self-pay | Admitting: Emergency Medicine

## 2015-07-12 ENCOUNTER — Inpatient Hospital Stay (HOSPITAL_COMMUNITY)
Admission: EM | Admit: 2015-07-12 | Discharge: 2015-07-14 | DRG: 064 | Disposition: A | Discharge status: Home / Self Care | Payer: Medicare Other | Attending: Internal Medicine | Admitting: Internal Medicine

## 2015-07-12 ENCOUNTER — Inpatient Hospital Stay (HOSPITAL_COMMUNITY)
Admit: 2015-07-12 | Discharge: 2015-07-12 | Disposition: A | Discharge status: Home / Self Care | Payer: Medicare Other | Attending: Neurology | Admitting: Neurology

## 2015-07-12 DIAGNOSIS — Z85038 Personal history of other malignant neoplasm of large intestine: Secondary | ICD-10-CM | POA: Diagnosis not present

## 2015-07-12 DIAGNOSIS — Z880 Allergy status to penicillin: Secondary | ICD-10-CM

## 2015-07-12 DIAGNOSIS — E1142 Type 2 diabetes mellitus with diabetic polyneuropathy: Secondary | ICD-10-CM | POA: Diagnosis not present

## 2015-07-12 DIAGNOSIS — G8929 Other chronic pain: Secondary | ICD-10-CM | POA: Diagnosis not present

## 2015-07-12 DIAGNOSIS — I25119 Atherosclerotic heart disease of native coronary artery with unspecified angina pectoris: Secondary | ICD-10-CM

## 2015-07-12 DIAGNOSIS — D631 Anemia in chronic kidney disease: Secondary | ICD-10-CM | POA: Diagnosis present

## 2015-07-12 DIAGNOSIS — M549 Dorsalgia, unspecified: Secondary | ICD-10-CM | POA: Diagnosis not present

## 2015-07-12 DIAGNOSIS — Z87891 Personal history of nicotine dependence: Secondary | ICD-10-CM

## 2015-07-12 DIAGNOSIS — E785 Hyperlipidemia, unspecified: Secondary | ICD-10-CM | POA: Diagnosis not present

## 2015-07-12 DIAGNOSIS — Z794 Long term (current) use of insulin: Secondary | ICD-10-CM | POA: Diagnosis not present

## 2015-07-12 DIAGNOSIS — Z79899 Other long term (current) drug therapy: Secondary | ICD-10-CM

## 2015-07-12 DIAGNOSIS — I252 Old myocardial infarction: Secondary | ICD-10-CM | POA: Diagnosis not present

## 2015-07-12 DIAGNOSIS — I639 Cerebral infarction, unspecified: Principal | ICD-10-CM | POA: Diagnosis present

## 2015-07-12 DIAGNOSIS — G4733 Obstructive sleep apnea (adult) (pediatric): Secondary | ICD-10-CM | POA: Diagnosis present

## 2015-07-12 DIAGNOSIS — N179 Acute kidney failure, unspecified: Secondary | ICD-10-CM | POA: Diagnosis not present

## 2015-07-12 DIAGNOSIS — K219 Gastro-esophageal reflux disease without esophagitis: Secondary | ICD-10-CM | POA: Diagnosis not present

## 2015-07-12 DIAGNOSIS — N184 Chronic kidney disease, stage 4 (severe): Secondary | ICD-10-CM | POA: Diagnosis not present

## 2015-07-12 DIAGNOSIS — I69351 Hemiplegia and hemiparesis following cerebral infarction affecting right dominant side: Secondary | ICD-10-CM

## 2015-07-12 DIAGNOSIS — E860 Dehydration: Secondary | ICD-10-CM | POA: Diagnosis not present

## 2015-07-12 DIAGNOSIS — I6330 Cerebral infarction due to thrombosis of unspecified cerebral artery: Secondary | ICD-10-CM

## 2015-07-12 DIAGNOSIS — G9340 Encephalopathy, unspecified: Secondary | ICD-10-CM | POA: Diagnosis not present

## 2015-07-12 DIAGNOSIS — I130 Hypertensive heart and chronic kidney disease with heart failure and stage 1 through stage 4 chronic kidney disease, or unspecified chronic kidney disease: Secondary | ICD-10-CM | POA: Diagnosis not present

## 2015-07-12 DIAGNOSIS — R531 Weakness: Secondary | ICD-10-CM | POA: Diagnosis present

## 2015-07-12 DIAGNOSIS — Z823 Family history of stroke: Secondary | ICD-10-CM | POA: Diagnosis not present

## 2015-07-12 DIAGNOSIS — E662 Morbid (severe) obesity with alveolar hypoventilation: Secondary | ICD-10-CM | POA: Diagnosis present

## 2015-07-12 DIAGNOSIS — M6289 Other specified disorders of muscle: Secondary | ICD-10-CM

## 2015-07-12 DIAGNOSIS — E1122 Type 2 diabetes mellitus with diabetic chronic kidney disease: Secondary | ICD-10-CM | POA: Diagnosis not present

## 2015-07-12 DIAGNOSIS — E7800 Pure hypercholesterolemia, unspecified: Secondary | ICD-10-CM | POA: Diagnosis not present

## 2015-07-12 DIAGNOSIS — G9341 Metabolic encephalopathy: Secondary | ICD-10-CM

## 2015-07-12 DIAGNOSIS — I679 Cerebrovascular disease, unspecified: Secondary | ICD-10-CM

## 2015-07-12 DIAGNOSIS — I69391 Dysphagia following cerebral infarction: Secondary | ICD-10-CM

## 2015-07-12 DIAGNOSIS — Z7982 Long term (current) use of aspirin: Secondary | ICD-10-CM

## 2015-07-12 DIAGNOSIS — J449 Chronic obstructive pulmonary disease, unspecified: Secondary | ICD-10-CM | POA: Diagnosis not present

## 2015-07-12 DIAGNOSIS — Z955 Presence of coronary angioplasty implant and graft: Secondary | ICD-10-CM | POA: Diagnosis not present

## 2015-07-12 DIAGNOSIS — I69359 Hemiplegia and hemiparesis following cerebral infarction affecting unspecified side: Secondary | ICD-10-CM

## 2015-07-12 DIAGNOSIS — I69320 Aphasia following cerebral infarction: Secondary | ICD-10-CM

## 2015-07-12 DIAGNOSIS — G8110 Spastic hemiplegia affecting unspecified side: Secondary | ICD-10-CM | POA: Diagnosis present

## 2015-07-12 DIAGNOSIS — N189 Chronic kidney disease, unspecified: Secondary | ICD-10-CM | POA: Diagnosis present

## 2015-07-12 DIAGNOSIS — G4730 Sleep apnea, unspecified: Secondary | ICD-10-CM

## 2015-07-12 DIAGNOSIS — R5383 Other fatigue: Secondary | ICD-10-CM | POA: Diagnosis present

## 2015-07-12 DIAGNOSIS — I509 Heart failure, unspecified: Secondary | ICD-10-CM | POA: Diagnosis present

## 2015-07-12 DIAGNOSIS — Z7902 Long term (current) use of antithrombotics/antiplatelets: Secondary | ICD-10-CM | POA: Diagnosis not present

## 2015-07-12 DIAGNOSIS — R4182 Altered mental status, unspecified: Secondary | ICD-10-CM | POA: Diagnosis present

## 2015-07-12 DIAGNOSIS — I2510 Atherosclerotic heart disease of native coronary artery without angina pectoris: Secondary | ICD-10-CM | POA: Diagnosis present

## 2015-07-12 DIAGNOSIS — Z9989 Dependence on other enabling machines and devices: Secondary | ICD-10-CM

## 2015-07-12 LAB — GLUCOSE, CAPILLARY
Glucose-Capillary: 140 mg/dL — ABNORMAL HIGH (ref 65–99)
Glucose-Capillary: 166 mg/dL — ABNORMAL HIGH (ref 65–99)
Glucose-Capillary: 146 mg/dL — ABNORMAL HIGH (ref 65–99)

## 2015-07-12 LAB — COMPREHENSIVE METABOLIC PANEL
ALT: 12 U/L — ABNORMAL LOW (ref 14–54)
AST: 15 U/L (ref 15–41)
Albumin: 3.6 g/dL (ref 3.5–5.0)
Alkaline Phosphatase: 62 U/L (ref 38–126)
Anion gap: 8 (ref 5–15)
BUN: 41 mg/dL — ABNORMAL HIGH (ref 6–20)
CO2: 22 mmol/L (ref 22–32)
Calcium: 9.5 mg/dL (ref 8.9–10.3)
Chloride: 110 mmol/L (ref 101–111)
Creatinine, Ser: 3.11 mg/dL — ABNORMAL HIGH (ref 0.44–1.00)
GFR calc Af Amer: 17 mL/min — ABNORMAL LOW (ref >60)
GFR calc non Af Amer: 15 mL/min — ABNORMAL LOW (ref >60)
Glucose, Bld: 211 mg/dL — ABNORMAL HIGH (ref 65–99)
Potassium: 3.8 mmol/L (ref 3.5–5.1)
Sodium: 140 mmol/L (ref 135–145)
Total Bilirubin: 0.2 mg/dL — ABNORMAL LOW (ref 0.3–1.2)
Total Protein: 7 g/dL (ref 6.5–8.1)

## 2015-07-12 LAB — URINALYSIS, ROUTINE W REFLEX MICROSCOPIC
Bilirubin Urine: NEGATIVE
Glucose, UA: NEGATIVE mg/dL
Ketones, ur: NEGATIVE mg/dL
Nitrite: NEGATIVE
Protein, ur: NEGATIVE mg/dL
Specific Gravity, Urine: 1.016 (ref 1.005–1.030)
pH: 5.5 (ref 5.0–8.0)

## 2015-07-12 LAB — RAPID URINE DRUG SCREEN, HOSP PERFORMED
Amphetamines: NOT DETECTED
Barbiturates: NOT DETECTED
Benzodiazepines: NOT DETECTED
Cocaine: NOT DETECTED
Opiates: NOT DETECTED
Tetrahydrocannabinol: NOT DETECTED

## 2015-07-12 LAB — PROTIME-INR
INR: 1.26 (ref 0.00–1.49)
Prothrombin Time: 15.9 seconds — ABNORMAL HIGH (ref 11.6–15.2)

## 2015-07-12 LAB — CBG MONITORING, ED: Glucose-Capillary: 200 mg/dL — ABNORMAL HIGH (ref 65–99)

## 2015-07-12 LAB — CBC
HCT: 31.7 % — ABNORMAL LOW (ref 36.0–46.0)
Hemoglobin: 10.3 g/dL — ABNORMAL LOW (ref 12.0–15.0)
MCH: 29.8 pg (ref 26.0–34.0)
MCHC: 32.5 g/dL (ref 30.0–36.0)
MCV: 91.6 fL (ref 78.0–100.0)
Platelets: 165 10*3/uL (ref 150–400)
RBC: 3.46 MIL/uL — ABNORMAL LOW (ref 3.87–5.11)
RDW: 15.1 % (ref 11.5–15.5)
WBC: 7.8 10*3/uL (ref 4.0–10.5)

## 2015-07-12 LAB — TROPONIN I: Troponin I: <0.03 ng/mL (ref <0.031)

## 2015-07-12 LAB — URINE MICROSCOPIC-ADD ON: RBC / HPF: NONE SEEN RBC/hpf (ref 0–5)

## 2015-07-12 LAB — APTT: aPTT: 29 seconds (ref 24–37)

## 2015-07-12 MED ORDER — HEPARIN SODIUM (PORCINE) 5000 UNIT/ML IJ SOLN
5000.0000 [IU] | Freq: Three times a day (TID) | INTRAMUSCULAR | Status: DC
  Administered 2015-07-12 – 2015-07-14 (×7): 5000 [IU] via SUBCUTANEOUS
  Filled 2015-07-12 (×7): qty 1

## 2015-07-12 MED ORDER — PREGABALIN 25 MG PO CAPS
50.0000 mg | ORAL_CAPSULE | Freq: Two times a day (BID) | ORAL | Status: DC
Start: 2015-07-12 — End: 2015-07-14
  Administered 2015-07-12 – 2015-07-14 (×5): 50 mg via ORAL
  Filled 2015-07-12 (×5): qty 2

## 2015-07-12 MED ORDER — CLOPIDOGREL BISULFATE 75 MG PO TABS
75.0000 mg | ORAL_TABLET | Freq: Every day | ORAL | Status: DC
  Administered 2015-07-12 – 2015-07-14 (×3): 75 mg via ORAL
  Filled 2015-07-12 (×3): qty 1

## 2015-07-12 MED ORDER — PANTOPRAZOLE SODIUM 40 MG PO TBEC
40.0000 mg | DELAYED_RELEASE_TABLET | Freq: Every day | ORAL | Status: DC
  Administered 2015-07-12 – 2015-07-14 (×3): 40 mg via ORAL
  Filled 2015-07-12 (×3): qty 1

## 2015-07-12 MED ORDER — INSULIN GLARGINE 100 UNIT/ML ~~LOC~~ SOLN
15.0000 [IU] | Freq: Every day | SUBCUTANEOUS | Status: DC
  Administered 2015-07-12 – 2015-07-13 (×2): 15 [IU] via SUBCUTANEOUS
  Filled 2015-07-12 (×3): qty 0.15

## 2015-07-12 MED ORDER — ACETAMINOPHEN 325 MG PO TABS: 650.0000 mg | ORAL_TABLET | ORAL | Status: DC | PRN

## 2015-07-12 MED ORDER — FUROSEMIDE 20 MG PO TABS
20.0000 mg | ORAL_TABLET | Freq: Every day | ORAL | Status: DC
  Administered 2015-07-12 – 2015-07-13 (×2): 20 mg via ORAL
  Filled 2015-07-12 (×2): qty 1

## 2015-07-12 MED ORDER — SODIUM CHLORIDE 0.9 % IV SOLN
INTRAVENOUS | Status: DC
  Administered 2015-07-12: 08:00:00 via INTRAVENOUS

## 2015-07-12 MED ORDER — ASPIRIN EC 81 MG PO TBEC
81.0000 mg | DELAYED_RELEASE_TABLET | Freq: Every day | ORAL | Status: DC
  Administered 2015-07-12 – 2015-07-14 (×3): 81 mg via ORAL
  Filled 2015-07-12 (×3): qty 1

## 2015-07-12 MED ORDER — AMLODIPINE BESYLATE 5 MG PO TABS
5.0000 mg | ORAL_TABLET | Freq: Every day | ORAL | Status: DC
  Administered 2015-07-13: 5 mg via ORAL
  Filled 2015-07-12 (×2): qty 1

## 2015-07-12 MED ORDER — CIPROFLOXACIN IN D5W 400 MG/200ML IV SOLN
400.0000 mg | INTRAVENOUS | Status: DC
  Administered 2015-07-12: 400 mg via INTRAVENOUS
  Filled 2015-07-12: qty 200

## 2015-07-12 MED ORDER — SODIUM CHLORIDE 0.9 % IV SOLN
INTRAVENOUS | Status: DC
  Administered 2015-07-13 – 2015-07-14 (×3): via INTRAVENOUS

## 2015-07-12 MED ORDER — FERROUS SULFATE 325 (65 FE) MG PO TABS
325.0000 mg | ORAL_TABLET | Freq: Two times a day (BID) | ORAL | Status: DC
  Administered 2015-07-12 – 2015-07-14 (×5): 325 mg via ORAL
  Filled 2015-07-12 (×5): qty 1

## 2015-07-12 MED ORDER — HYDRALAZINE HCL 20 MG/ML IJ SOLN: 5.0000 mg | Freq: Three times a day (TID) | INTRAMUSCULAR | Status: DC | PRN

## 2015-07-12 MED ORDER — INSULIN ASPART 100 UNIT/ML ~~LOC~~ SOLN
0.0000 [IU] | Freq: Three times a day (TID) | SUBCUTANEOUS | Status: DC
  Administered 2015-07-12: 2 [IU] via SUBCUTANEOUS
  Administered 2015-07-12: 1 [IU] via SUBCUTANEOUS
  Administered 2015-07-13 (×2): 2 [IU] via SUBCUTANEOUS
  Administered 2015-07-13: 1 [IU] via SUBCUTANEOUS
  Administered 2015-07-14 (×2): 2 [IU] via SUBCUTANEOUS

## 2015-07-12 MED ORDER — ATORVASTATIN CALCIUM 80 MG PO TABS
80.0000 mg | ORAL_TABLET | Freq: Every day | ORAL | Status: DC
  Administered 2015-07-12 – 2015-07-14 (×3): 80 mg via ORAL
  Filled 2015-07-12 (×3): qty 1

## 2015-07-12 MED ORDER — METOPROLOL SUCCINATE ER 25 MG PO TB24
50.0000 mg | ORAL_TABLET | Freq: Every day | ORAL | Status: DC
  Administered 2015-07-12 – 2015-07-14 (×3): 50 mg via ORAL
  Filled 2015-07-12 (×3): qty 2

## 2015-07-12 MED ORDER — ACETAMINOPHEN 650 MG RE SUPP: 650.0000 mg | RECTAL | Status: DC | PRN

## 2015-07-12 NOTE — ED Notes (Signed)
Daughter aware of room assignment

## 2015-07-12 NOTE — H&P (Signed)
History and Physical    Michelle Holmes [id number] DOB: 1950-04-29 DOA: 07/12/2015   PCP: Ricke Hey, MD   Patient coming from:  Home     Chief Complaint: Unresponsiveness  HPI: Michelle Holmes is a 65 y.o. female with medical history significant for HTN, HLD, CKD, DM, COPD, CAD s/p MI,  OSA, CVA in 12/2014 involving the L thalamus and L cerebellar peduncle, and most recently on 07/04/15 L CVA parietal region, with mild R residual hemiparesis,  currently on dual antiplatelet therapy with aspirin and Plavix as well as statins, rought to the emergency room for evaluation and management of altered mental status and unresponsiveness. She was last seen normal at 7 PM.  Per chart report, No apparent vertigo dizziness or vision change or headaches. She is very somnolent and obtunded, cannot follow commands at this time. She is recovering from recent UTI treated with Cipro. Chart states Family reports these are symptoms similar to the ones in prior CVAs. Unclear of tPA status, awaiting Neuro recommendations Will admit for further evaluation and treatment.   ED Course:  BP 164/81 mmHg  Pulse 66  Temp(Src) 98.3 F (36.8 C) (Oral)  Resp 14  SpO2 100%  CT head negative for acute intracranial abnormalities MRI today reveals what appears to be acute stroke within the posterior limb of the right internal capsule EKG NSR without ACS. Cr 3.1 (BL2.5)  Glucose 211   Review of Systems: Unable to obtain, patient unable to respond at this time   Past Medical History  Diagnosis Date  . Emphysema   . Colon cancer ([place])   . Hypertension   . Hypercholesteremia   . Diabetes mellitus   . COPD (chronic obstructive pulmonary disease) ([place])   . Coronary artery disease   . Obesity   . Sleep apnea   . TIA (transient ischemic attack)   . CHF (congestive heart failure) ([place])   . Anemia   . DJD (degenerative joint disease)   . Renal insufficiency   . Chronic back pain   . Scoliosis   . Obesity  hypoventilation syndrome ([place])   . Stroke ([place])   . Myocardial infarction [facility])     Past Surgical History  Procedure Laterality Date  . Coronary angioplasty with stent placement    . Colon surgery      Social History Social History   Social History  . Marital Status: Widowed    Spouse Name: N/A  . Number of Children: N/A  . Years of Education: N/A   Occupational History  . Not on file.   Social History Main Topics  . Smoking status: Former Research scientist (life sciences)  . Smokeless tobacco: Not on file  . Alcohol Use: No  . Drug Use: No  . Sexual Activity: Not on file   Other Topics Concern  . Not on file   Social History Narrative     Allergies  Allergen Reactions  . Penicillins Hives and Swelling    Has patient had a PCN reaction causing immediate rash, facial/tongue/throat swelling, SOB or lightheadedness with hypotension: yes Has patient had a PCN reaction causing severe rash involving mucus membranes or skin necrosis: no Has patient had a PCN reaction that required hospitalization no Has patient had a PCN reaction occurring within the last 10 years: no If all of the above answers are "NO", then may proceed with Cephalosporin use.     Family History  Problem Relation Age of Onset  . Stroke Maternal Aunt  Prior to Admission medications   Medication Sig Start Date End Date Taking? Authorizing Provider  ADVAIR DISKUS 250-50 MCG/DOSE AEPB Inhale 1 puff into the lungs daily as needed (shortness of breath).  08/22/14  Yes Historical Provider, MD  amLODipine (NORVASC) 5 MG tablet Take 5 mg by mouth at bedtime.   Yes Historical Provider, MD  aspirin 81 MG EC tablet Take 1 tablet (81 mg total) by mouth daily. 07/07/15  Yes Mauricio Gerome Apley, MD  atorvastatin (LIPITOR) 80 MG tablet Take 1 tablet (80 mg total) by mouth daily at 6 PM. 07/07/15  Yes Mauricio Gerome Apley, MD  ciprofloxacin (CIPRO) 500 MG tablet Take 1 tablet (500 mg total) by mouth 2 (two) times daily. 07/07/15   Yes Mauricio Gerome Apley, MD  clopidogrel (PLAVIX) 75 MG tablet Take 1 tablet (75 mg total) by mouth daily. 02/07/15  Yes Daniel J Angiulli, PA-C  cyclobenzaprine (FLEXERIL) 10 MG tablet Take 10 mg by mouth 2 (two) times daily as needed (pain).   Yes Historical Provider, MD  fenofibrate 54 MG tablet Take 1 tablet (54 mg total) by mouth daily. 02/07/15  Yes Daniel J Angiulli, PA-C  ferrous sulfate 325 (65 FE) MG tablet Take 1 tablet (325 mg total) by mouth 2 (two) times daily with a meal. 02/07/15  Yes Daniel J Angiulli, PA-C  furosemide (LASIX) 20 MG tablet Take 20 mg by mouth daily. Or as directed   Yes Historical Provider, MD  glipiZIDE (GLUCOTROL) 5 MG tablet Take 1 tablet (5 mg total) by mouth daily before breakfast. 02/07/15  Yes Daniel J Angiulli, PA-C  Insulin Glargine (LANTUS SOLOSTAR) 100 UNIT/ML Solostar Pen Inject 30 Units into the skin at bedtime.   Yes Historical Provider, MD  LYRICA 50 MG capsule Take 1 capsule (50 mg total) by mouth 2 (two) times daily. 99991111  Yes Delora Fuel, MD  metoprolol succinate (TOPROL-XL) 50 MG 24 hr tablet Take 50 mg by mouth daily. Take with or immediately following a meal.   Yes Historical Provider, MD  Oxycodone HCl 20 MG TABS Take 1 tablet by mouth 3 (three) times daily. 06/20/15  Yes Historical Provider, MD  pantoprazole (PROTONIX) 40 MG tablet Take 40 mg by mouth daily.   Yes Historical Provider, MD    Physical Exam:    Filed Vitals:   07/12/15 0630 07/12/15 0645 07/12/15 0700 07/12/15 0813  BP: 154/77 157/87 154/78 164/81  Pulse: 64 77 58 66  Temp:    98.3 F (36.8 C)  TempSrc:    Oral  Resp: 14 18 12 14   SpO2: 100% 100% 98% 100%      Constitutional: NAD,obtunded, lethargic  Filed Vitals:   07/12/15 0630 07/12/15 0645 07/12/15 0700 07/12/15 0813  BP: 154/77 157/87 154/78 164/81  Pulse: 64 77 58 66  Temp:    98.3 F (36.8 C)  TempSrc:    Oral  Resp: 14 18 12 14   SpO2: 100% 100% 98% 100%   Eyes: PERRL, lids and conjunctivae  normal ENMT: Mucous membranes are moist. Posterior pharynx clear of any exudate or lesions. Neck: normal, supple, no masses, no thyromegaly Respiratory: clear to auscultation bilaterally, no wheezing, no crackles. Normal respiratory effort. No accessory muscle use.  Cardiovascular: Regular rate and rhythm, no murmurs / rubs / gallops. No extremity edema. 2+ pedal pulses. No carotid bruits.  Abdomen: obese no tenderness, no masses palpated. No hepatosplenomegaly. Bowel sounds positive.  Musculoskeletal: no clubbing / cyanosis. No joint deformity upper and lower extremities.  Skin: no rashes, lesions, ulcers.  Neurologic: Unable to assess, patient lethargic, obtunded at this time, cannot follow commands      Labs on Admission: I have personally reviewed following labs and imaging studies  CBC:  Recent Labs Lab 07/06/15 0523 07/12/15 0107  WBC 6.7 7.8  NEUTROABS 3.5  --   HGB 9.8* 10.3*  HCT 29.7* 31.7*  MCV 90.0 91.6  PLT 173 [id number]    Basic Metabolic Panel:  Recent Labs Lab 07/06/15 0523 07/12/15 0107  NA 142 140  K 3.9 3.8  CL 112* 110  CO2 22 22  GLUCOSE 130* 211*  BUN 41* 41*  CREATININE 2.56* 3.11*  CALCIUM 9.0 9.5    GFR: Estimated Creatinine Clearance: 21.7 mL/min (by C-G formula based on Cr of 3.11).  Liver Function Tests:  Recent Labs Lab 07/06/15 0523 07/12/15 0107  AST 11* 15  ALT 9* 12*  ALKPHOS 59 62  BILITOT 0.6 0.2*  PROT 5.9* 7.0  ALBUMIN 3.1* 3.6   No results for input(s): LIPASE, AMYLASE in the last 168 hours. No results for input(s): AMMONIA in the last 168 hours.  Coagulation Profile: No results for input(s): INR, PROTIME in the last 168 hours.  Cardiac Enzymes:  Recent Labs Lab 07/05/15 0907  TROPONINI <0.03    BNP (last 3 results) No results for input(s): PROBNP in the last 8760 hours.  HbA1C: No results for input(s): HGBA1C in the last 72 hours.  CBG:  Recent Labs Lab 07/07/15 0029 07/07/15 0403 07/07/15 0816  07/07/15 1201 07/12/15 0227  GLUCAP 120* 121* 119* 120* 200*    Lipid Profile: No results for input(s): CHOL, HDL, LDLCALC, TRIG, CHOLHDL, LDLDIRECT in the last 72 hours.  Thyroid Function Tests: No results for input(s): TSH, T4TOTAL, FREET4, T3FREE, THYROIDAB in the last 72 hours.  Anemia Panel: No results for input(s): VITAMINB12, FOLATE, FERRITIN, TIBC, IRON, RETICCTPCT in the last 72 hours.  Urine analysis:    Component Value Date/Time   COLORURINE YELLOW 07/04/2015 1609   APPEARANCEUR CLOUDY* 07/04/2015 1609   LABSPEC 1.009 07/04/2015 1609   PHURINE 7.0 07/04/2015 1609   GLUCOSEU NEGATIVE 07/04/2015 1609   HGBUR TRACE* 07/04/2015 1609   BILIRUBINUR NEGATIVE 07/04/2015 1609   [place] 07/04/2015 1609   PROTEINUR NEGATIVE 07/04/2015 1609   UROBILINOGEN 0.2 06/20/2014 0808   NITRITE NEGATIVE 07/04/2015 1609   LEUKOCYTESUR LARGE* 07/04/2015 1609    Sepsis Labs: @LABRCNTIP (procalcitonin:4,lacticidven:4) ) Recent Results (from the past 240 hour(s))  Urine culture     Status: Abnormal   Collection Time: 07/04/15  4:09 PM  Result Value Ref Range Status   Specimen Description URINE, CLEAN CATCH  Final   Special Requests NONE  Final   Culture >=100,000 COLONIES/mL ENTEROCOCCUS SPECIES (A)  Final   Report Status 07/07/2015 FINAL  Final   Organism ID, Bacteria ENTEROCOCCUS SPECIES (A)  Final      Susceptibility   Enterococcus species - MIC*    AMPICILLIN <=2 SENSITIVE Sensitive     LEVOFLOXACIN 0.5 SENSITIVE Sensitive     NITROFURANTOIN <=16 SENSITIVE Sensitive     VANCOMYCIN 1 SENSITIVE Sensitive     * >=100,000 COLONIES/mL ENTEROCOCCUS SPECIES     Radiological Exams on Admission: Ct Head Wo Contrast  07/12/2015  CLINICAL DATA:  Altered mental status. History of hypertension, diabetes, TIA, stroke EXAM: CT HEAD WITHOUT CONTRAST TECHNIQUE: Contiguous axial images were obtained from the base of the skull through the vertex without intravenous contrast.  COMPARISON:  MRI brain 07/04/2015.  CT head 07/04/2015. FINDINGS: Diffuse cerebral atrophy. Mild ventricular dilatation consistent with central atrophy. Old area of low attenuation in the left frontal lobe consistent with old infarct. Low-attenuation changes in the deep white matter consistent with small vessel ischemia. No mass effect or midline shift. No abnormal extra-axial fluid collections. Gray-white matter junctions are distinct. Basal cisterns are not effaced. No evidence of acute intracranial hemorrhage. No depressed skull fractures. Mucosal thickening in the paranasal sinuses. Mastoid air cells are not opacified. Vascular calcifications. No change since prior study. IMPRESSION: No acute intracranial abnormalities. Diffuse atrophy and small vessel ischemic changes. Old infarct in the left frontal lobe. Electronically Signed   By: Lucienne Capers M.D.   On: 07/12/2015 02:29   Mr Brain Wo Contrast  07/12/2015  CLINICAL DATA:  Initial evaluation acute altered mental status. EXAM: MRI HEAD WITHOUT CONTRAST TECHNIQUE: Multiplanar, multiecho pulse sequences of the brain and surrounding structures were obtained without intravenous contrast. COMPARISON:  Air CT from earlier the same day as well as previous MRI from 07/04/2015. FINDINGS: Diffuse prominence of the CSF containing spaces is compatible with generalized cerebral atrophy. Patchy and confluent T2/FLAIR hyperintensity within the periventricular and deep white matter both cerebral hemispheres most consistent with chronic small vessel ischemic, moderate nature. Chronic small vessel ischemic changes present within the pons as well. Encephalomalacia with associated laminar necrosis within the anterior left frontal lobe, compatible with remote ischemic infarct. Scattered remote lacunar infarcts within the bilateral basal ganglia. Wallerian degeneration within the left cerebral peduncle and left midbrain. Probable small remote lacunar infarct within the pons  as well. Small focus of susceptibility artifact consistent with remote chronic micro hemorrhage within this region, stable. Recently identified small ischemic infarct within the left periatrial white matter again seen, stable. No interval hemorrhage or mass effect. There is a punctate 4 mm focus of diffusion abnormality within the posterior limb of the right internal capsule, which may reflect a small acute ischemic infarct (series 4, image 28). No associated hemorrhage or mass effect. No other acute infarct. Gray-white matter differentiation otherwise maintained. Major intracranial vascular flow voids are preserved. Ectasia of the intracranial circulation noted. Is no mass lesion, midline shift, or mass effect. No hydrocephalus. No extra-axial fluid collection. Major dural sinuses are grossly patent. Craniocervical junction within normal limits. Visualized upper cervical spine unremarkable. Pituitary gland within normal limits. No acute abnormality about the globes and orbits. Mild mucosal thickening within the maxillary sinuses. Paranasal sinuses are otherwise clear. No mastoid effusion. Inner ear structures normal. Bone marrow signal intensity within normal limits. No scalp soft tissue abnormality. IMPRESSION: 1. Punctate 4 mm focus of diffusion abnormality within the posterior limb of the right internal capsule, suspicious for a possible small acute ischemic infarct. 2. Normal expected interval evolution of small left periatrial white matter infarct. 3. Otherwise stable appearance of the brain with chronic findings as above. Electronically Signed   By: Jeannine Boga M.D.   On: 07/12/2015 06:09    EKG: Independently reviewed.  Assessment/Plan Principal Problem:   Altered mental status Active Problems:   Sleep apnea   Morbid obesity (HCC)   CHF (congestive heart failure) (HCC)   Chronic renal failure   CAD (coronary artery disease)   Right sided weakness   CVA (cerebral vascular accident)  ([place])   AKI (acute kidney injury) ([place])   Lethargy   DM type 2 with diabetic peripheral neuropathy ([place])   Hyperlipidemia   Chronic obstructive pulmonary disease (HCC)   Hemiparesis, aphasia, and dysphagia as  late effect of cerebrovascular accident (CVA) ([place])   Spastic hemiplegia affecting nondominant side ([place])   Acute encephalopathy   Acute CVA (cerebrovascular accident) ([place])   Chronic kidney disease (CKD), stage IV (severe) ([place])    Acute Confusional State in a patient wit a History of stroke with right residual weakness, now with Acute CVA. CT head is negative for acute intracranial abnormalities. MRI today reveals what appears to be acute stroke within the posterior limb of the right internal capsule. Last Carotid US with 99991111 LICAs, no stenosis on L, antegrade vertebral flow.  BP 154/78 mmHg Currently on Plavix, ASA and statin   NIH unable to be assessed for now. Patient continues to be lethargic and obtunded  -Tele obs Stroke order set Allow permissive HTN -Neuro following, awaiting recommendations -PT/OT/SLP  lipid panel  Check A1C -Aspirin  Air overlay mattress Anticoagulation as per Neuro recommendations UDS  Recent UTI unclear its role in acute confusional state -  F/u UA and  urine culture Will continue Cipro in its equivalency as IV due to inability to swallow at this time, per pharmacy dose   Type II Diabetes Current blood sugar level is 211. Anion gap 8 Lab Results  Component Value Date   HGBA1C 7.1* 07/05/2015  Hold home oral diabetic medications.  Lantus , SSI Heart healthy carb modified diet.   Hypertension BP 164/81 mmHg  Pulse 66  Hold home anti-hypertensive medications to allow permissive hypertension Hold Lasix for now Add Hydralazine Q6 hours as needed for BP 210/110, restart meds when ok with Neuro  Hyperlipidemia Continue home statins when able to interact and swallow.  GERD: -Protonix once able to be awake and swallow, o/w to switch to  IV  Chronic kidney disease stage  IV-V   baseline creatinine 2.5,  current 3.11 in the setting of dehydration -  Minimize nephrotoxins and renally dose medications Lab Results  Component Value Date   CREATININE 3.11* 07/12/2015   CREATININE 2.56* 07/06/2015   CREATININE 2.40* 07/05/2015  Gentle IVF Repeat CMET in am  Anemia of chronic disease Hemoglobin 9.8 around BL -Transfuse 1 unit packed red blood cells if Hb less than 8 or acutely bleeding Repeat CBC in am  DVT prophylaxis: SCD's until Neuro recommendations Code Status:   Full    Family Communication:  None  Disposition Plan: Expect patient to be discharged to home after condition improves Consults called:   Neuro Admission status:Tele  Obs    [facility], PA-C [facility]   If 7PM-7AM, please contact night-coverage [email or web address] Password [email or web address]  07/12/2015, 8:20 AM

## 2015-07-12 NOTE — ED Provider Notes (Signed)
CSN: [id number]     Arrival date & time 07/12/15  0024  History  By signing my name below, I, Roxine Caddy, attest that this documentation has been prepared under the direction and in the presence of Veryl Speak, MD.  Electronically signed: Roxine Caddy, ED Scribe. 07/12/2015. 12:48 AM.   Chief Complaint  Patient presents with  . Altered Mental Status   The history is provided by the EMS personnel. No language interpreter was used.   LEVEL 5 CAVEAT DUE TO DECREASED RESPONSIVENESS HPI Comments: Michelle Holmes is a 65 y.o. female brought in by ambulance, who presents to the Emergency Department here for altered mental status. Nurse states the pt was last known normal around 7:00 pm tonight while watching T.V. Nurse states pt is non-responsive, which is not her usually baseline. According to family pt is normally talking.  Past Medical History  Diagnosis Date  . Emphysema   . Colon cancer ([place])   . Hypertension   . Hypercholesteremia   . Diabetes mellitus   . COPD (chronic obstructive pulmonary disease) ([place])   . Coronary artery disease   . Obesity   . Sleep apnea   . TIA (transient ischemic attack)   . CHF (congestive heart failure) ([place])   . Anemia   . DJD (degenerative joint disease)   . Renal insufficiency   . Chronic back pain   . Scoliosis   . Obesity hypoventilation syndrome ([place])   . Stroke ([place])   . Myocardial infarction [facility])    Past Surgical History  Procedure Laterality Date  . Coronary angioplasty with stent placement    . Colon surgery     Family History  Problem Relation Age of Onset  . Stroke Maternal Aunt    Social History  Substance Use Topics  . Smoking status: Former Research scientist (life sciences)  . Smokeless tobacco: None  . Alcohol Use: No   OB History    No data available     Review of Systems  Unable to perform ROS: Mental status change      Allergies  Penicillins  Home Medications   Prior to Admission medications   Medication Sig Start Date End Date  Taking? Authorizing Provider  ADVAIR DISKUS 250-50 MCG/DOSE AEPB Inhale 1 puff into the lungs daily as needed (shortness of breath).  08/22/14   Historical Provider, MD  amLODipine (NORVASC) 5 MG tablet Take 5 mg by mouth at bedtime.    Historical Provider, MD  aspirin 81 MG EC tablet Take 1 tablet (81 mg total) by mouth daily. 07/07/15   Mauricio Gerome Apley, MD  atorvastatin (LIPITOR) 80 MG tablet Take 1 tablet (80 mg total) by mouth daily at 6 PM. 07/07/15   Mauricio Gerome Apley, MD  ciprofloxacin (CIPRO) 500 MG tablet Take 1 tablet (500 mg total) by mouth 2 (two) times daily. 07/07/15   Mauricio Gerome Apley, MD  clopidogrel (PLAVIX) 75 MG tablet Take 1 tablet (75 mg total) by mouth daily. 02/07/15   Lavon Paganini Angiulli, PA-C  cyclobenzaprine (FLEXERIL) 10 MG tablet Take 10 mg by mouth 2 (two) times daily as needed (pain).    Historical Provider, MD  fenofibrate 54 MG tablet Take 1 tablet (54 mg total) by mouth daily. 02/07/15   Lavon Paganini Angiulli, PA-C  ferrous sulfate 325 (65 FE) MG tablet Take 1 tablet (325 mg total) by mouth 2 (two) times daily with a meal. 02/07/15   Lavon Paganini Angiulli, PA-C  furosemide (LASIX) 20 MG tablet Take 20 mg  by mouth daily. Or as directed    Historical Provider, MD  glipiZIDE (GLUCOTROL) 5 MG tablet Take 1 tablet (5 mg total) by mouth daily before breakfast. 02/07/15   Lavon Paganini Angiulli, PA-C  Insulin Glargine (LANTUS SOLOSTAR) 100 UNIT/ML Solostar Pen Inject 30 Units into the skin at bedtime.    Historical Provider, MD  LYRICA 50 MG capsule Take 1 capsule (50 mg total) by mouth 2 (two) times daily. 99991111   Delora Fuel, MD  metoprolol succinate (TOPROL-XL) 50 MG 24 hr tablet Take 50 mg by mouth daily. Take with or immediately following a meal.    Historical Provider, MD  Oxycodone HCl 20 MG TABS Take 1 tablet by mouth 3 (three) times daily. 06/20/15   Historical Provider, MD  pantoprazole (PROTONIX) 40 MG tablet Take 40 mg by mouth daily.    Historical Provider, MD    BP 121/54 mmHg  Pulse 62  Resp 16  SpO2 97% Physical Exam  Constitutional: She is oriented to person, place, and time. She appears well-developed and well-nourished. No distress.  HENT:  Head: Normocephalic and atraumatic.  Eyes:  Pupils are constricted but reactive  Neck: Normal range of motion.  Cardiovascular: Normal rate, regular rhythm and normal heart sounds.   Pulmonary/Chest: Effort normal and breath sounds normal. She has no wheezes.  Abdominal: Soft.  Musculoskeletal: Normal range of motion.  Neurological: She is alert and oriented to person, place, and time.  Pt is somnolent. She was initially arousable and opened eyes to voice. She is now difficult to arouse. Neuro exam is limited secondary to this. The nurse observed pt to use her left hand with purpose.  Skin: Skin is warm and dry. She is not diaphoretic.  Psychiatric: She has a normal mood and affect. Judgment normal.  Nursing note and vitals reviewed.   ED Course  Procedures  DIAGNOSTIC STUDIES: Oxygen Saturation is 97% on RA, normal by my interpretation.  COORDINATION OF CARE: 12:49 AM Discussed treatment plan of EKG, blood work and imaging with pt at bedside and pt agreed to plan. 2:51 AM Spoke with patients family regarding today's incidence. Will update on status.  Labs Review Labs Reviewed  COMPREHENSIVE METABOLIC PANEL  CBC  CBG MONITORING, ED    Imaging Review No results found. I have personally reviewed and evaluated these images and lab results as part of my medical decision-making.  ED ECG REPORT   Date: 07/12/2015  Rate: 62  Rhythm: normal sinus rhythm  QRS Axis: normal  Intervals: normal  ST/T Wave abnormalities: normal  Conduction Disutrbances:none  Narrative Interpretation:   Old EKG Reviewed: none available  I have personally reviewed the EKG tracing and agree with the computerized printout as noted.   MDM   Final diagnoses:  None    Patient with history of right  hemiparesis resulting from prior CVA many years ago. She presents today for evaluation of increased somnolence. She was admitted for similar complaints a few days ago and was diagnosed with an acute stroke. She was doing better until today when this somnolence recurred. Her MRI today reveals what appears to be acute stroke within the posterior limb of the right internal capsule. She has been evaluated by Dr. Gifford Shave who agrees with my assessment that the patient should be admitted for further evaluation. I've spoken with Dr. Alcario Drought who agrees to admit.  I personally performed the services described in this documentation, which was scribed in my presence. The recorded information has been reviewed and is  accurate.        Veryl Speak, MD 07/12/15 [phone]

## 2015-07-12 NOTE — Evaluation (Signed)
Clinical/Bedside Swallow Evaluation Patient Details  Name: Michelle Holmes MRN: [id number] Date of Birth: 13-Jun-1950  Today's Date: 07/12/2015 Time: SLP Start Time (ACUTE ONLY): 29 SLP Stop Time (ACUTE ONLY): 1228 SLP Time Calculation (min) (ACUTE ONLY): 8 min  Past Medical History:  Past Medical History  Diagnosis Date  . Emphysema   . Colon cancer ([place])   . Hypertension   . Hypercholesteremia   . Diabetes mellitus   . COPD (chronic obstructive pulmonary disease) ([place])   . Coronary artery disease   . Obesity   . Sleep apnea   . TIA (transient ischemic attack)   . CHF (congestive heart failure) ([place])   . Anemia   . DJD (degenerative joint disease)   . Renal insufficiency   . Chronic back pain   . Scoliosis   . Obesity hypoventilation syndrome ([place])   . Stroke ([place])   . Myocardial infarction [facility])    Past Surgical History:  Past Surgical History  Procedure Laterality Date  . Coronary angioplasty with stent placement    . Colon surgery     HPI:  Michelle Holmes is a 65 y.o. female with medical history significant for HTN, HLD, CKD, DM, COPD, CAD s/p MI, OSA, CVA in 12/2014 involving the L thalamus and L cerebellar peduncle, and most recently on 07/04/15 L CVA parietal region, with mild R residual hemiparesis, currently on dual antiplatelet therapy with aspirin and Plavix as well as statins, rought to the emergency room for evaluation and management of altered mental status and unresponsiveness. MRI shows Punctate 4 mm focus of diffusion abnormality within the posterior limb of the right internal capsule, suspicious for a possible small acute ischemic infarct.   Assessment / Plan / Recommendation Clinical Impression  Pt demonstrates normal swallow function despite poor dentition and baseline right CN VII and XII deficits. Pt has tolerated a regular diet and thin liquids since last CVA, no new deficits are observed. Pt may consume a regular diet and thin liquids, will sign off.      Aspiration Risk  Mild aspiration risk    Diet Recommendation Regular;Thin liquid   Liquid Administration via: Cup;Straw Medication Administration: Whole meds with liquid Supervision: Patient able to self feed Compensations: Minimize environmental distractions;Slow rate;Small sips/bites    Other  Recommendations Oral Care Recommendations: Oral care BID     Swallow Study   General HPI: Michelle Holmes is a 65 y.o. female with medical history significant for HTN, HLD, CKD, DM, COPD, CAD s/p MI, OSA, CVA in 12/2014 involving the L thalamus and L cerebellar peduncle, and most recently on 07/04/15 L CVA parietal region, with mild R residual hemiparesis, currently on dual antiplatelet therapy with aspirin and Plavix as well as statins, rought to the emergency room for evaluation and management of altered mental status and unresponsiveness. MRI shows Punctate 4 mm focus of diffusion abnormality within the posterior limb of the right internal capsule, suspicious for a possible small acute ischemic infarct. Type of Study: Bedside Swallow Evaluation Previous Swallow Assessment: Multiple BSE after prior CVa, only mild oral dysphagia which resolved, pt last recommended to consume regular diet and thin liquids.  Diet Prior to this Study: NPO Temperature Spikes Noted: No Respiratory Status: Room air History of Recent Intubation: No Behavior/Cognition: Alert;Cooperative Oral Cavity Assessment: Within Functional Limits Oral Care Completed by SLP: No Oral Cavity - Dentition: Missing dentition;Poor condition;Edentulous (few teeth) Vision: Functional for self-feeding Self-Feeding Abilities: Able to feed self Patient Positioning: Upright in bed Baseline  Vocal Quality: Normal Volitional Cough: Strong Volitional Swallow: Able to elicit    Oral/Motor/Sensory Function Overall Oral Motor/Sensory Function: Mild impairment Facial ROM: Reduced right;Suspected CN VII (facial) dysfunction Facial Symmetry: Abnormal  symmetry right;Suspected CN VII (facial) dysfunction Facial Strength: Reduced right;Suspected CN VII (facial) dysfunction Lingual ROM: Reduced right;Suspected CN XII (hypoglossal) dysfunction Lingual Symmetry: Abnormal symmetry right;Suspected CN XII (hypoglossal) dysfunction Lingual Strength: Reduced;Suspected CN XII (hypoglossal) dysfunction   Ice Chips     Thin Liquid Thin Liquid: Within functional limits Presentation: Cup;Straw;Self Fed    Nectar Thick Nectar Thick Liquid: Not tested   Honey Thick Honey Thick Liquid: Not tested   Puree Puree: Within functional limits Presentation: Spoon   Solid   GO   Solid: Within functional limits       [facility], [place] CCC-SLP [id number]  Lynann Beaver 07/12/2015,12:33 PM

## 2015-07-12 NOTE — Progress Notes (Signed)
Pharmacy Antibiotic Note  Michelle Holmes is a 65 y.o. female admitted on 07/12/2015 with altered mental status.  Pharmacy has been consulted on 07/12/15 to continue Cipro in equivalent IV dose for UTI as patient is encephalopathic at this time. Patient currently on Cipro 500 mg po BID since 07/07/15 for recent UTI .  Last cipro PO dose taken PTA on 07/11/15.  She has a h/o CKD. SCr = 3.11 *up from 2.56 on 07/06/15.   CrCl ~ 21.7 ml/min.  Her renal function indicates she only needs cipro PO or IV form once daily.   Plan: Cipro 400 mg IV q24h      Temp (24hrs), Avg:98.4 F (36.9 C), Min:98.3 F (36.8 C), Max:98.4 F (36.9 C)   Recent Labs Lab 07/06/15 0523 07/12/15 0107  WBC 6.7 7.8  CREATININE 2.56* 3.11*    Estimated Creatinine Clearance: 21.7 mL/min (by C-G formula based on Cr of 3.11).    Allergies  Allergen Reactions  . Penicillins Hives and Swelling    Has patient had a PCN reaction causing immediate rash, facial/tongue/throat swelling, SOB or lightheadedness with hypotension: yes Has patient had a PCN reaction causing severe rash involving mucus membranes or skin necrosis: no Has patient had a PCN reaction that required hospitalization no Has patient had a PCN reaction occurring within the last 10 years: no If all of the above answers are "NO", then may proceed with Cephalosporin use.     Antimicrobials this admission: Cipro 6/10 pta >> (changed from PO to IV on admit 6/16)   Dose adjustments this admission: PO cipro dose PTA adjusted to IV dose on 07/12/15 admit date.  Microbiology results: 6/16 BCx: sent 6/16 UCx:  ordered   Thank you for allowing pharmacy to be a part of this patient's care. [name] Cella, RPh Clinical Pharmacist Pager: [phone] 07/12/2015 10:54 AM

## 2015-07-12 NOTE — ED Notes (Signed)
Patient resting with eyes closed - will arouse when moved.  Family at bedside

## 2015-07-12 NOTE — Consult Note (Signed)
Neurology Consultation Reason for Consult: Altered mental status Referring Physician: Dr Stark Jock   HPI: Michelle Holmes is a 65 y.o. female with history of stroke in December 2016 involving left thalamus and left cerebellar peduncle and most recently 07/04/2015 in left parietal region, hypertension, hyperlipidemia, diabetes mellitus, COPD, Connie artery disease brought to the emergency room for evaluation and management for altered mental status and loss of consciousness. Last seen normal at 7 PM. On examination patient is encephalopathic and stuporous. Recently discharged from the hospital at that time she was diagnosed with the urinary tract infection. CT scan of the head did not show any acute abnormalities. Previous workup including MRI/MRA of the head showed severe signs of cerebral vascular disease. No seizure activity reported. On her baseline she is guarded mild spastic right hemiparesis. Blood workup suggest elevated creatinine 3.1. 6 days ago was 2.56 Currently on dual antiplatelet therapy aspirin and Plavix and statin  Past Medical History  Diagnosis Date  . Emphysema   . Colon cancer ([place])   . Hypertension   . Hypercholesteremia   . Diabetes mellitus   . COPD (chronic obstructive pulmonary disease) ([place])   . Coronary artery disease   . Obesity   . Sleep apnea   . TIA (transient ischemic attack)   . CHF (congestive heart failure) ([place])   . Anemia   . DJD (degenerative joint disease)   . Renal insufficiency   . Chronic back pain   . Scoliosis   . Obesity hypoventilation syndrome ([place])   . Stroke ([place])   . Myocardial infarction [facility])    Past Surgical History  Procedure Laterality Date  . Coronary angioplasty with stent placement    . Colon surgery     Family History  Problem Relation Age of Onset  . Stroke Maternal Aunt    Social History  Substance Use Topics  . Smoking status: Former  Research scientist (life sciences)  . Smokeless tobacco: None  . Alcohol Use: No   OB History    No data available     Review of Systems  Unable to perform ROS: Mental status change      Allergies  Penicillins  Home Medications   Prior to Admission medications   Medication Sig Start Date End Date Taking? Authorizing Provider  ADVAIR DISKUS 250-50 MCG/DOSE AEPB Inhale 1 puff into the lungs daily as needed (shortness of breath).  08/22/14   Historical Provider, MD  amLODipine (NORVASC) 5 MG tablet Take 5 mg by mouth at bedtime.    Historical Provider, MD  aspirin 81 MG EC tablet Take 1 tablet (81 mg total) by mouth daily. 07/07/15   Mauricio Gerome Apley, MD  atorvastatin (LIPITOR) 80 MG tablet Take 1 tablet (80 mg total) by mouth daily at 6 PM. 07/07/15   Mauricio Gerome Apley, MD  ciprofloxacin (CIPRO) 500 MG tablet Take 1 tablet (500 mg total) by mouth 2 (two) times daily. 07/07/15   Mauricio Gerome Apley, MD  clopidogrel (PLAVIX) 75 MG tablet Take 1 tablet (75 mg total) by mouth daily. 02/07/15   Lavon Paganini Angiulli, PA-C  cyclobenzaprine (FLEXERIL) 10 MG tablet Take 10 mg by mouth 2 (two) times daily as needed (pain).    Historical Provider, MD  fenofibrate 54 MG tablet Take 1 tablet (54 mg total) by mouth daily. 02/07/15   Lavon Paganini Angiulli, PA-C  ferrous sulfate 325 (65 FE) MG tablet Take 1 tablet (325 mg total) by mouth 2 (two) times daily with a meal. 02/07/15   Quillian Quince  J Angiulli, PA-C  furosemide (LASIX) 20 MG tablet Take 20 mg by mouth daily. Or as directed    Historical Provider, MD  glipiZIDE (GLUCOTROL) 5 MG tablet Take 1 tablet (5 mg total) by mouth daily before breakfast. 02/07/15   Lavon Paganini Angiulli, PA-C  Insulin Glargine (LANTUS SOLOSTAR) 100 UNIT/ML Solostar Pen Inject 30 Units into the skin at bedtime.    Historical Provider, MD  LYRICA 50 MG capsule Take 1 capsule (50 mg total) by mouth 2 (two) times  daily. [id number]   Delora Fuel, MD  metoprolol succinate (TOPROL-XL) 50 MG 24 hr tablet Take 50 mg by mouth daily. Take with or immediately following a meal.    Historical Provider, MD  Oxycodone HCl 20 MG TABS Take 1 tablet by mouth 3 (three) times daily. 06/20/15   Historical Provider, MD  pantoprazole (PROTONIX) 40 MG tablet Take 40 mg by mouth daily.             Exam: Current vital signs: BP 129/62 mmHg  Pulse 62  Resp 16  SpO2 97% Vital signs in last 24 hours: Pulse Rate:  [59-68] 62 (06/16 0300) Resp:  [12-16] 16 (06/16 0300) BP: (111-129)/(54-75) 129/62 mmHg (06/16 0300) SpO2:  [97 %-100 %] 97 % (06/16 0300)  Physical Exam  Constitutional: Lying in bed encephalopathic Psych: Does not follow any commands withdraws to painful stimuli Eyes: No scleral injection HENT: No OP obstrucion Head: Normocephalic.  Cardiovascular: Normal rate and regular rhythm.  Respiratory: Effort normal and breath sounds normal to anterior ascultation.  Skin: WDI  Neuro: Mental Status: Patient is encephalopathic does not follow any commands Cranial Nerves: Pupils are reactive bilaterally, face seems symmetrical Motor exam Withdraws to painful stimuli left more than right    I have reviewed labs in epic and the results pertinent to this consultation are: Creatinine of 3.1 and electrolytes okay  I have reviewed the images obtained: CT scan of the head does not show any acute abnormalities   Assessment and recommendation  65 year old female with history of severe cerebral vascular disease and prior history of stroke presents with altered mental status. Diagnostic consideration metabolic encephalopathy secondary to renal failure. Will obtain brain MRI to rule out stroke. Will obtain EEG for further management. Continue symptomatic management.  Lurena Joiner, MD 3:53 AM

## 2015-07-12 NOTE — ED Notes (Signed)
[name] (daughter) [phone] would like to be called when a room is assigned

## 2015-07-12 NOTE — Progress Notes (Signed)
Pt arrived from the ED at 0800. Pt lethargic and opened eye briefly and falls back to sleep. No family at bedside upon arrival to the unit. PA up to see pt upon arrival to the room; skin dry but clean and intact with not pressure ulcer or open wounds noted. VSS; unable to assess NIH presently. Pt placed on telemetry; CCMD called and verified with NT. Call light within pt reach. Will continue to closely monitor pt. Francis Gaines Jacinto Keil RN.

## 2015-07-12 NOTE — ED Notes (Signed)
Patient talking at this time.  Answer questions, asking how and why she is here and talking about her family member playing games on her phone

## 2015-07-12 NOTE — Progress Notes (Signed)
EEG Completed; Results Pending  

## 2015-07-12 NOTE — ED Notes (Signed)
Per EMS:  Pt from home.  Called out for Altered Mental Status.  Pt w/ a hx of multiple strokes with similar presentation symptoms.  Pt is lethargic, rouses to loud sounds and painful stimulation.  Pt just picked nose with left hand in room but will not open eyes.  Pt has apneic moments as well.  2mg  of Narcan given en route without any positive result.  CBG normal.  Unable to assess for Stroke rule out due to not following commands.  Pt is on 20mg  of oxycodone prn at home.  Family/Friends on scene not great historians.

## 2015-07-13 DIAGNOSIS — G9340 Encephalopathy, unspecified: Secondary | ICD-10-CM

## 2015-07-13 LAB — LIPID PANEL
Cholesterol: 106 mg/dL (ref 0–200)
HDL: 20 mg/dL — ABNORMAL LOW (ref >40)
LDL Cholesterol: 48 mg/dL (ref 0–99)
Total CHOL/HDL Ratio: 5.3 RATIO
Triglycerides: 190 mg/dL — ABNORMAL HIGH (ref <150)
VLDL: 38 mg/dL (ref 0–40)

## 2015-07-13 LAB — BASIC METABOLIC PANEL
Anion gap: 5 (ref 5–15)
BUN: 38 mg/dL — ABNORMAL HIGH (ref 6–20)
CO2: 22 mmol/L (ref 22–32)
Calcium: 8.6 mg/dL — ABNORMAL LOW (ref 8.9–10.3)
Chloride: 114 mmol/L — ABNORMAL HIGH (ref 101–111)
Creatinine, Ser: 2.76 mg/dL — ABNORMAL HIGH (ref 0.44–1.00)
GFR calc Af Amer: 20 mL/min — ABNORMAL LOW (ref >60)
GFR calc non Af Amer: 17 mL/min — ABNORMAL LOW (ref >60)
Glucose, Bld: 150 mg/dL — ABNORMAL HIGH (ref 65–99)
Potassium: 3.8 mmol/L (ref 3.5–5.1)
Sodium: 141 mmol/L (ref 135–145)

## 2015-07-13 LAB — URINE CULTURE

## 2015-07-13 LAB — GLUCOSE, CAPILLARY
Glucose-Capillary: 174 mg/dL — ABNORMAL HIGH (ref 65–99)
Glucose-Capillary: 150 mg/dL — ABNORMAL HIGH (ref 65–99)
Glucose-Capillary: 142 mg/dL — ABNORMAL HIGH (ref 65–99)
Glucose-Capillary: 168 mg/dL — ABNORMAL HIGH (ref 65–99)
Glucose-Capillary: 164 mg/dL — ABNORMAL HIGH (ref 65–99)

## 2015-07-13 MED ORDER — LEVOFLOXACIN 500 MG PO TABS
500.0000 mg | ORAL_TABLET | ORAL | Status: DC
  Administered 2015-07-13: 500 mg via ORAL
  Filled 2015-07-13: qty 1

## 2015-07-13 MED ORDER — LEVOFLOXACIN 500 MG PO TABS: 500.0000 mg | ORAL_TABLET | Freq: Every day | ORAL | Status: DC

## 2015-07-13 NOTE — Therapy (Signed)
[facility] [address] [place], [place], [address] Phone: [phone]   Fax:  [phone]  Physical Therapy Treatment  Patient Details  Name: Michelle Holmes MRN: [id number] Date of Birth: [birth year] Referring Provider: Alysia Penna, MD  Encounter Date: 07/11/2015   07/11/15 1022  PT Visits / Re-Eval  Visit Number 16  Number of Visits 21 (requesting 8 additional PT visits)  Date for PT Re-Evaluation 08/27/15  Authorization  Authorization Type [employer] MCR - G Codes and PN required every 10 visits  PT Time Calculation  PT Start Time 1019  PT Stop Time 1100  PT Time Calculation (min) 41 min  PT - End of Session  Equipment Utilized During Treatment Gait belt  Activity Tolerance Patient tolerated treatment well  Behavior During Therapy [facility] for tasks assessed/performed     Past Medical History  Diagnosis Date  . Emphysema   . Colon cancer ([place])   . Hypertension   . Hypercholesteremia   . Diabetes mellitus   . COPD (chronic obstructive pulmonary disease) ([place])   . Coronary artery disease   . Obesity   . Sleep apnea   . TIA (transient ischemic attack)   . CHF (congestive heart failure) ([place])   . Anemia   . DJD (degenerative joint disease)   . Renal insufficiency   . Chronic back pain   . Scoliosis   . Obesity hypoventilation syndrome ([place])   . Stroke ([place])   . Myocardial infarction [facility])     Past Surgical History  Procedure Laterality Date  . Coronary angioplasty with stent placement    . Colon surgery      There were no vitals filed for this visit.     07/11/15 1022  Symptoms/Limitations  Subjective No new complaints. No falls or pain to report. Having a rough day today (pt teary on arrival for PT from OT appt just before).  Pertinent History PMH significant for: CVA x2 (most recent: 01/13/15), HTN, HLD, CHF, COPD, OSA, CAD, DM II, colon cancer, DJD, scoliosis, chronic renal insufficiency  Patient  Stated Goals "I want a new wheelchair and I want to walk."  Pain Assessment  Currently in Pain? No/denies  Pain Score 0      07/11/15 1024  Transfers  Transfers Sit to Stand;Stand to Sit  Sit to Stand 3: Mod assist;With upper extremity assist;From bed;From chair/3-in-1  Sit to Stand Details Tactile cues for placement;Tactile cues for weight shifting;Tactile cues for initiation  Sit to Stand Details (indicate cue type and reason) x 3 reps with cues on sequencing and technique.  Stand to Sit 4: Min assist;3: Mod assist;With upper extremity assist;To bed;To chair/3-in-1;With armrests;Uncontrolled descent  Stand to Sit Details (indicate cue type and reason) Verbal cues for technique;Tactile cues for weight beaing;Tactile cues for weight shifting  Stand to Sit Details cues to reach back for more controlled descent with sitting down.  Stand Pivot Transfers 3: Mod assist  Comments standing with PFRW and KI on right leg: worked on upright posture, weight shifting laterally, and left leg stepping fwd/bwd and side<>side  Ambulation/Gait  Ambulation/Gait Yes  Ambulation/Gait Assistance 4: Min assist;3: Mod assist (of 2 people)  Ambulation/Gait Assistance Details using PFRW and KI on right leg to assist with knee stability: mod assist of 2 progressing on min assist of 2 for safety. pt able to self advance walker today and initiate right leg advancement, needing assistance to complete the step and for step placment  Ambulation Distance (Feet)  48 Feet  Assistive device Right platform walker  Gait Pattern Decreased hip/knee flexion - right;Poor foot clearance - right;Scissoring;Decreased weight shift to right;Decreased dorsiflexion - right;Decreased stride length;Decreased step length - left;Right flexed knee in stance  Ambulation Surface Level;Indoor  Lumbar Exercises: Seated  Other Seated Lumbar Exercises mat core exercises: with inverted chair behind pt modified curl ups x 10 reps with cues on  maintaining midline position; lateral elbow taps to mat table and back up with emphasis on posture and to stay forward (not lean back) x 10 each way.                                PT Short Term Goals - 06/28/15 1804    PT SHORT TERM GOAL #1   Title Pt will demonstrate consistent carryover of logroll technique for supine <> sit for R-side WB, increased efficiency of movement, and increased independence with bed mobility.   (Target date: 05/29/15)   Baseline Met 6/2.   Status Achieved   PT SHORT TERM GOAL #2   Title Pt will perform supine <> sit with supervision, min cueing to indicate increased independence getting into/out of bed.   Baseline 6/2: Min A required.   Status Partially Met   PT SHORT TERM GOAL #3   Title Pt will perform level transfers from w/c <> mat table in B directions with min A, min cueing to indicate increased independence with transfers.   Baseline 6/2: squat pivot min A to L, mod A to R. Requires mod A for stand pivot due to R knee instability.  Will continue as LTG.   Status Partially Met   PT SHORT TERM GOAL #4   Title Pt will perform sit <> stand from w/c with min A, min cueing with LRAD to indicate increased pt independence with functional transfers.   Baseline Met 6/2.   Status Achieved   PT SHORT TERM GOAL #5   Title Pt will perform dynamic standing task x2 consecutive minutes with single UE support and min to indicate improved postural awareness/control with functional standing.   Baseline 6/2: Deferred until R AFO received due to R knee instability in static/dynamic standing.    Status Deferred   PT SHORT TERM GOAL #6   Title ATP will assess current w/c to assist in determining if pt appropriate candidate for custom w/c to improve positioning, preserve skin integrity, and increase pt independence with mobility.    Baseline Met 4/25.   Status Achieved   PT SHORT TERM GOAL #7   Title Pt will improve PASS score from 12/36 to 15/36 to indicate improved  independence iwth funcitonal mobility.   Baseline 6/2: PASS score = 18/36.   Status Achieved           PT Long Term Goals - 07/04/15 1626    PT LONG TERM GOAL #1   Title Pt will consistently perform supine <> sit with supervision, min cueing to indicate increased independence getting into/out of bed.  (Modified target date: 07/26/15)   Baseline 6/2: REVISED from mod I to supervision, cueing due to R inattention.  Continue goal through renewed POC.   Status Revised   PT LONG TERM GOAL #2   Title Pt will perform transfers from w/c <> mat table in B directions with CGA to indicate increased independence with transfers. (Modified target date: 07/26/15)   Baseline 6/2: squat pivot min A to L, mod A to  R. Requires mod A for stand pivot due to R knee instability.   REVISED from supervision to CGA due to R knee instability.  Continue goal through renewed POC.   Status Partially Met   PT LONG TERM GOAL #3   Title Pt will perform unlevel transfers in B directions with min A, min cueing for increased safety/independence with functional transfers.   Baseline 6/2: Deferred; LTG #2 revised to focus on all transfers.   Status Deferred   PT LONG TERM GOAL #4   Title Pt will perform sit <> stand from w/c with supervision using LRAD to indicate increased pt independence with functional   (Modified target date: 07/26/15)   Baseline 06/25/15: pt is inconsistent in assistance needed, ranging from min guard assist to mod assist.  6/2: REVISED to add LRAD.  Continue goal through renewed POC.   Status Not Met   PT LONG TERM GOAL #5   Title Pt will improve PASS score from 12/36 to 18/36 to indicate significantly improved independence with functional mobility.   Baseline Achieved initial PASS goal for 18/36. Defer revised goal (for score of 22/36) due to safety concerns with dynamic standing until R AFO received.   Status Achieved   PT LONG TERM GOAL #6   Title Will attempt ambulation and set goal, if  appropriate, to assess if safe/functional to address short-distance household ambulation.   Baseline 06/25/15: gait initiated in parallel bars for a least 2 sessions now. pt does need assistance at this time. ? need for orthotic on right leg due to knee buckling.. PT to further assess and set goals.   Status Achieved   PT LONG TERM GOAL #7   Title Orthotist will assess patient appropriateness for R AFO to promote R knee stability during transfers and gait.  (Target date: 07/26/15)   Status On-going   PT LONG TERM GOAL #8   Title Pt will ambulate x15' with mod A using LRAD to initiate short-distance household ambulation, progress toward PLOF.   (Target date: 07/26/15)   Status On-going        07/11/15 1023  Plan  Clinical Impression Statement Continued to work on postural control, standing tolerance/balance/posture and gait today without any issues reported. Pt is progressing toward goals. Pt instructed to look for her brace at home and bring this brace with her to her next visit.   Pt will benefit from skilled therapeutic intervention in order to improve on the following deficits Postural dysfunction;Impaired tone;Abnormal gait;Decreased balance;Decreased activity tolerance;Decreased endurance;Decreased coordination;Decreased mobility;Impaired perceived functional ability;Impaired sensation;Decreased knowledge of use of DME;Decreased strength;Increased edema;Impaired vision/preception;Decreased cognition  Rehab Potential Good  Clinical Impairments Affecting Rehab Potential cognitive impairments; limited family support; relies on friend for transportation  PT Frequency 2x / week  PT Duration 4 weeks  PT Treatment/Interventions ADLs/Self Care Home Management;DME Instruction;Electrical Stimulation;Vestibular;Gait training;Neuromuscular re-education;Stair training;Functional mobility training;Therapeutic activities;Patient/family education;Therapeutic exercise;Balance training;Prosthetic  Training;Orthotic Fit/Training;Wheelchair mobility training  PT Next Visit Plan continue gait training/ standing as tolerated  Consulted and Agree with Plan of Care Patient       Patient will benefit from skilled therapeutic intervention in order to improve the following deficits and impairments:  Postural dysfunction, Impaired tone, Abnormal gait, Decreased balance, Decreased activity tolerance, Decreased endurance, Decreased coordination, Decreased mobility, Impaired perceived functional ability, Impaired sensation, Decreased knowledge of use of DME, Decreased strength, Increased edema, Impaired vision/preception, Decreased cognition  Visit Diagnosis: Muscle weakness (generalized)  Spastic hemiplegia affecting right dominant side (HCC)  Other lack of coordination  Other abnormalities of gait and mobility     Problem List Patient Active Problem List   Diagnosis Date Noted  . Chronic kidney disease (CKD), stage IV (severe) ([place]) 07/06/2015  . Acute CVA (cerebrovascular accident) ([place])   . Acute encephalopathy 07/04/2015  . Sinus bradycardia 07/04/2015  . Type 2 diabetes mellitus with vascular disease ([place]) 07/04/2015  . UTI (lower urinary tract infection) 07/04/2015  . Parietal lobe infarction ([place]) 07/04/2015  . Spastic hemiplegia affecting nondominant side ([place]) 07/01/2015  . Obesity, morbid ([place]) 07/01/2015  . Absolute anemia   . Thrombocytopenia ([place])   . Acute ischemic VBA thalamic stroke ([place]) 01/18/2015  . AKI (acute kidney injury) ([place])   . Dysarthria   . Lethargy   . DM type 2 with diabetic peripheral neuropathy ([place])   . Labile blood pressure   . Hyperlipidemia   . History of CVA with residual deficit   . Chronic obstructive pulmonary disease ([place])   . Hemiparesis, aphasia, and dysphagia as late effect of cerebrovascular accident (CVA) ([place])   . Acute ischemic stroke ([place])   . CVA (cerebral vascular accident) ([place]) 01/14/2015  . CVA (cerebral infarction)  01/14/2015  . Right hemiplegia ([place])   . Right sided weakness 01/13/2015  . Abdominal pain 06/20/2014  . Altered mental status 02/08/2012  . Sleep apnea 12/08/2010  . Morbid obesity ([place]) 12/08/2010  . CHF (congestive heart failure) ([place]) 12/08/2010  . Chronic renal failure 12/08/2010  . TIA (transient ischemic attack) 12/08/2010  . CAD (coronary artery disease) 12/08/2010  . Cellulitis of pubic region 12/01/2010  . Uncontrolled type 2 DM with hyperosmolar nonketotic hyperglycemia ([place]) 12/01/2010    Willow Ora, PTA, [facility] [address], [place], [place] [address] [phone] 07/13/2015, 7:46 PM   Name: Michelle Holmes MRN: [id number] Date of Birth: 03-28-1950

## 2015-07-13 NOTE — Progress Notes (Signed)
OT Cancellation Note  Patient Details Name: Michelle Holmes MRN: [id number] DOB: [birth year]   Cancelled Treatment:    Reason Eval/Treat Not Completed: OT screened, no needs identified, will sign off. Per PT; pt seems to be at functional baseline. Pt with good family support at home who assists with her care. Pt has been receiving Outpatient Neuro OT PTA; will defer all further OT needs to next venue. No acute OT needs identified; signing off at this time. Please re-consult if needs change. Thank you for this referral.  Binnie Kand M.S., OTR/L Pager: [phone]  07/13/2015, 12:30 PM

## 2015-07-13 NOTE — Progress Notes (Signed)
Patient A/O, no noted distress. Tolerated meds well. Patient able to make needs known. Right hand contracture. Patient assist with mobility in bed. Patient Cont B/B uses bedpan. No swallowing issues. Staff will continue to monitor and meet needs.

## 2015-07-13 NOTE — Consult Note (Signed)
EEG Report  Clinical history:  Change in mental status.  History of old left frontal cortical infarct.  Labs show renal insufficiency.    Technical Summary: A 19 channel digital EEG was performed using the 10-20 international system of electrode placement.  Bipolar and Referential montages were utilized.  Total recording time was 20 minutes.  Findings: The patient is in stage 2 sleep for most of the recording, with the presence of frontocentral sleep spindles and poorly developed vertex sharp waves.  There is prominent ECG artifact throughout.  With stimulation, the patient awakens briefly and background frequencies are about 7 Hz during this.  No focal slowing.  There are no epileptiform discharges or electrographic seizures.    Impression:  This is an abnormal EEG, mostly in the sleep state.  There is evidence of mild generalized slowing of brain activity which may be due to toxic, metabolic, hypoxic, or infectious etiologies  Her renal insufficiency is the most likely reason, but clinical correlation is recommended.

## 2015-07-13 NOTE — Evaluation (Signed)
Physical Therapy Evaluation Patient Details Name: Michelle Holmes MRN: [id number] DOB: 19-Apr-1950 Today's Date: 07/13/2015   History of Present Illness  Michelle Holmes is a 65 y.o. female with medical history significant for HTN, HLD, CKD, DM, COPD, CAD s/p MI, OSA, CVA in 12/2014 involving the L thalamus and L cerebellar peduncle, and most recently on 07/04/15 L CVA parietal region, with mild R residual hemiparesis. Brought to the emergency room for evaluation and management of altered mental status and unresponsiveness. MRI shows Punctate 4 mm focus of diffusion abnormality within the posterior limb of the right internal capsule, suspicious for a possible small acute ischemic infarct.     Clinical Impression  Pt admitted with above diagnosis. Patient is anxious to return home and resume OPPT ("they work you hard.") Pt currently with functional limitations due to the deficits listed below (see PT Problem List). Noted MD plans for discharge home today. Patient is at her most recent baseline and family provides needed assist. She has all necessary DME.       Follow Up Recommendations Outpatient PT;Supervision/Assistance - 24 hour    Equipment Recommendations  None recommended by PT    Recommendations for Other Services       Precautions / Restrictions Precautions Precautions: Fall Precaution Comments: due to right hemi      Mobility  Bed Mobility Overal bed mobility: Needs Assistance Bed Mobility: Supine to Sit Rolling: Min assist         General bed mobility comments: pt reports she does not sleep in her bed; sleeps sitting up in her w/c  Transfers Overall transfer level: Needs assistance Equipment used: None Transfers: Squat Pivot Transfers     Squat pivot transfers: Mod assist     General transfer comment: Mod assist to support trunk for balance, anteriorly weight shift over feet and ensure that right knee is blocked to prevent buckling. Pt able to reach for chair armrest on  the left side (transfer to strong side) and assist with partial stand and turning her trunk.  Ambulation/Gait             General Gait Details: see OPPT notes; need +2  Stairs            Wheelchair Mobility    Modified Rankin (Stroke Patients Only)       Balance Overall balance assessment: Needs assistance Sitting-balance support: No upper extremity supported;Feet supported Sitting balance-Leahy Scale: Fair                                       Pertinent Vitals/Pain Pain Assessment: No/denies pain    Home Living Family/patient expects to be discharged to:: Private residence Living Arrangements: Other relatives (nephew and his wife) Available Help at Discharge: Family;Available 24 hours/day Type of Home: House Home Access: Ramped entrance     Home Layout: One level Home Equipment: Wheelchair - manual;Hospital bed;Walker - 2 wheels;Cane - single point;Bedside commode;Shower seat (reports hospital bed and electric w/c on order)      Prior Function Level of Independence: Needs assistance   Gait / Transfers Assistance Needed: assist from family for mobility and transfers in and out of [place]; currently receiving OPPT and working on standing and walking (with Rt knee immobilizer)           Hand Dominance   Dominant Hand: Right    Extremity/Trunk Assessment   Upper Extremity Assessment: Defer  to OT evaluation           Lower Extremity Assessment: RLE deficits/detail;LLE deficits/detail RLE Deficits / Details: hip flexion 2+, knee extension 2+, ankle DF 2+ LLE Deficits / Details: general weakness  Cervical / Trunk Assessment: Kyphotic  Communication   Communication: Expressive difficulties  Cognition Arousal/Alertness: Awake/alert Behavior During Therapy: WFL for tasks assessed/performed Overall Cognitive Status: History of cognitive impairments - at baseline (friend present)       Memory: Decreased short-term memory               General Comments      Exercises        Assessment/Plan    PT Assessment Patient needs continued PT services  PT Diagnosis Hemiplegia dominant side;Difficulty walking   PT Problem List Decreased strength;Decreased activity tolerance;Decreased balance;Decreased mobility;Decreased knowledge of use of DME;Decreased knowledge of precautions;Obesity;Impaired sensation;Impaired tone  PT Treatment Interventions DME instruction;Gait training;Stair training;Functional mobility training;Therapeutic activities;Therapeutic exercise;Balance training;Neuromuscular re-education;Cognitive remediation;Patient/family education   PT Goals (Current goals can be found in the Care Plan section) Acute Rehab PT Goals Patient Stated Goal: home today PT Goal Formulation: With patient Time For Goal Achievement: 07/20/15 Potential to Achieve Goals: Good    Frequency Min 4X/week   Barriers to discharge        Co-evaluation               End of Session Equipment Utilized During Treatment: Gait belt Activity Tolerance: Patient tolerated treatment well Patient left: in chair;with call bell/phone within reach;with chair alarm set;with family/visitor present Nurse Communication: Mobility status;Other (comment) (squat-pivot)    Functional Assessment Tool Used: clinical judgement Functional Limitation: Mobility: Walking and moving around Mobility: Walking and Moving Around Current Status [phone]): At least 20 percent but less than 40 percent impaired, limited or restricted Mobility: Walking and Moving Around Goal Status [phone]): At least 1 percent but less than 20 percent impaired, limited or restricted    Time: 1056-1120 PT Time Calculation (min) (ACUTE ONLY): 24 min   Charges:   PT Evaluation $PT Eval Low Complexity: 1 Procedure PT Treatments $Therapeutic Activity: 8-22 mins   PT G Codes:   PT G-Codes **NOT FOR INPATIENT CLASS** Functional Assessment Tool Used: clinical  judgement Functional Limitation: Mobility: Walking and moving around Mobility: Walking and Moving Around Current Status [id number]): At least 20 percent but less than 40 percent impaired, limited or restricted Mobility: Walking and Moving Around Goal Status [phone]): At least 1 percent but less than 20 percent impaired, limited or restricted    Omran Keelin 07/13/2015, 11:36 AM  Pager [phone]

## 2015-07-13 NOTE — Progress Notes (Signed)
PROGRESS NOTE    Michelle Holmes  [id number] DOB: [birth year] DOA: 07/12/2015 PCP: Ricke Hey, MD    Brief Narrative: Michelle Holmes is a 65 y.o. female with a past medical history of HTN, HLD, CKD, DM2, COPD, CAD s/p PCI, CVA; who presents with complaints of unresponsiveness. She was recently admitted for stroke and discharged home without complete work up she presents again this time with similar complaints.  Repeat MRI shows possible new stroke, discussed with neurology on call, suggested, that its an artifact. Further work up in  Progress.    Assessment & Plan:   Principal Problem:   Acute CVA (cerebrovascular accident) [facility]) Active Problems:   Sleep apnea   Morbid obesity (HCC)   CHF (congestive heart failure) (HCC)   Chronic renal failure   CAD (coronary artery disease)   Altered mental status   Right sided weakness   CVA (cerebral vascular accident) ([place])   AKI (acute kidney injury) ([place])   Lethargy   DM type 2 with diabetic peripheral neuropathy ([place])   Hyperlipidemia   Chronic obstructive pulmonary disease (HCC)   Hemiparesis, aphasia, and dysphagia as late effect of cerebrovascular accident (CVA) ([place])   Spastic hemiplegia affecting nondominant side ([place])   Acute encephalopathy   Chronic kidney disease (CKD), stage IV (severe) ([place])   Episode of unresponsiveness of unclear etiology: Punctate 4 mm focuc int he right internal capsule, apparently an artifact as per neurology.  EEG shows some slowing possibly from metabolic encephalopathy no epileptiform activity.  Echo and vascular duplex were not done last admission, and they are ordered today.  Pt wanted to leave today for a wedding in the evening, discussed with daughter, who wanted her mom to stay and complete the stroke work up.  Resume plavix.   STAGE 4 ckd: at baseline.   Diabetes mellitus: CBG (last 3)   Recent Labs  07/13/15 0640 07/13/15 1146 07/13/15 1406  GLUCAP 174* 150* 142*    Resume  SSI.    HYPERTENSION; Better controlled resume home meds.     DVT prophylaxis: (Lovenox Code Status: (Full) Family Communication: discussed with daughter over the phone.  Disposition Plan: PENDING FURTHER WORK UP.    Consultants:  NEUROLOGY.   Procedures: MRI brain  EEG.    Antimicrobials: none.    Subjective: Wants to go home .   Objective: Filed Vitals:   07/13/15 0515 07/13/15 0907 07/13/15 1035 07/13/15 1400  BP:  131/63 141/62 119/59  Pulse:  69 60 66  Temp: 98.6 F (37 C) 98.8 F (37.1 C) 98.1 F (36.7 C) 98.5 F (36.9 C)  TempSrc:  Oral Oral Oral  Resp:  18 20 20   SpO2:  100% 100% 98%    Intake/Output Summary (Last 24 hours) at 07/13/15 1600 Last data filed at 07/13/15 0600  Gross per 24 hour  Intake   1685 ml  Output      0 ml  Net   1685 ml   There were no vitals filed for this visit.  Examination:  General exam: Appears calm and comfortable  Respiratory system: Clear to auscultation. Respiratory effort normal. Cardiovascular system: S1 & S2 heard, RRR. No JVD, murmurs, rubs, gallops or clicks. No pedal edema. Gastrointestinal system: Abdomen is nondistended, soft and nontender. No organomegaly or masses felt. Normal bowel sounds heard. Central nervous system: Alert and  Appears oriented. No new focal neurological deficits. Extremities: Symmetric 5 x 5 power. Skin: No rashes, lesions or ulcers .  Data Reviewed: I have personally reviewed following labs and imaging studies  CBC:  Recent Labs Lab 07/12/15 0107  WBC 7.8  HGB 10.3*  HCT 31.7*  MCV 91.6  PLT 123XX123   Basic Metabolic Panel:  Recent Labs Lab 07/12/15 0107 07/13/15 1029  NA 140 141  K 3.8 3.8  CL 110 114*  CO2 22 22  GLUCOSE 211* 150*  BUN 41* 38*  CREATININE 3.11* 2.76*  CALCIUM 9.5 8.6*   GFR: Estimated Creatinine Clearance: 24.4 mL/min (by C-G formula based on Cr of 2.76). Liver Function Tests:  Recent Labs Lab 07/12/15 0107  AST 15  ALT 12*    ALKPHOS 62  BILITOT 0.2*  PROT 7.0  ALBUMIN 3.6   No results for input(s): LIPASE, AMYLASE in the last 168 hours. No results for input(s): AMMONIA in the last 168 hours. Coagulation Profile:  Recent Labs Lab 07/12/15 1253  INR 1.26   Cardiac Enzymes:  Recent Labs Lab 07/12/15 1253  TROPONINI <0.03   BNP (last 3 results) No results for input(s): PROBNP in the last 8760 hours. HbA1C: No results for input(s): HGBA1C in the last 72 hours. CBG:  Recent Labs Lab 07/12/15 1649 07/12/15 2143 07/13/15 0640 07/13/15 1146 07/13/15 1406  GLUCAP 166* 146* 174* 150* 142*   Lipid Profile:  Recent Labs  07/13/15 0534  CHOL 106  HDL 20*  LDLCALC 48  TRIG 190*  CHOLHDL 5.3   Thyroid Function Tests: No results for input(s): TSH, T4TOTAL, FREET4, T3FREE, THYROIDAB in the last 72 hours. Anemia Panel: No results for input(s): VITAMINB12, FOLATE, FERRITIN, TIBC, IRON, RETICCTPCT in the last 72 hours. Sepsis Labs: No results for input(s): PROCALCITON, LATICACIDVEN in the last 168 hours.  Recent Results (from the past 240 hour(s))  Urine culture     Status: Abnormal   Collection Time: 07/04/15  4:09 PM  Result Value Ref Range Status   Specimen Description URINE, CLEAN CATCH  Final   Special Requests NONE  Final   Culture >=100,000 COLONIES/mL ENTEROCOCCUS SPECIES (A)  Final   Report Status 07/07/2015 FINAL  Final   Organism ID, Bacteria ENTEROCOCCUS SPECIES (A)  Final      Susceptibility   Enterococcus species - MIC*    AMPICILLIN <=2 SENSITIVE Sensitive     LEVOFLOXACIN 0.5 SENSITIVE Sensitive     NITROFURANTOIN <=16 SENSITIVE Sensitive     VANCOMYCIN 1 SENSITIVE Sensitive     * >=100,000 COLONIES/mL ENTEROCOCCUS SPECIES  Culture, Urine     Status: Abnormal   Collection Time: 07/12/15 12:21 PM  Result Value Ref Range Status   Specimen Description URINE, CLEAN CATCH  Final   Special Requests NONE  Final   Culture MULTIPLE SPECIES PRESENT, SUGGEST RECOLLECTION (A)   Final   Report Status 07/13/2015 FINAL  Final         Radiology Studies: Ct Head Wo Contrast  07/12/2015  CLINICAL DATA:  Altered mental status. History of hypertension, diabetes, TIA, stroke EXAM: CT HEAD WITHOUT CONTRAST TECHNIQUE: Contiguous axial images were obtained from the base of the skull through the vertex without intravenous contrast. COMPARISON:  MRI brain 07/04/2015.  CT head 07/04/2015. FINDINGS: Diffuse cerebral atrophy. Mild ventricular dilatation consistent with central atrophy. Old area of low attenuation in the left frontal lobe consistent with old infarct. Low-attenuation changes in the deep white matter consistent with small vessel ischemia. No mass effect or midline shift. No abnormal extra-axial fluid collections. Gray-white matter junctions are distinct. Basal cisterns are not effaced. No evidence  of acute intracranial hemorrhage. No depressed skull fractures. Mucosal thickening in the paranasal sinuses. Mastoid air cells are not opacified. Vascular calcifications. No change since prior study. IMPRESSION: No acute intracranial abnormalities. Diffuse atrophy and small vessel ischemic changes. Old infarct in the left frontal lobe. Electronically Signed   By: Lucienne Capers M.D.   On: 07/12/2015 02:29   Mr Brain Wo Contrast  07/12/2015  CLINICAL DATA:  Initial evaluation acute altered mental status. EXAM: MRI HEAD WITHOUT CONTRAST TECHNIQUE: Multiplanar, multiecho pulse sequences of the brain and surrounding structures were obtained without intravenous contrast. COMPARISON:  Air CT from earlier the same day as well as previous MRI from 07/04/2015. FINDINGS: Diffuse prominence of the CSF containing spaces is compatible with generalized cerebral atrophy. Patchy and confluent T2/FLAIR hyperintensity within the periventricular and deep white matter both cerebral hemispheres most consistent with chronic small vessel ischemic, moderate nature. Chronic small vessel ischemic changes  present within the pons as well. Encephalomalacia with associated laminar necrosis within the anterior left frontal lobe, compatible with remote ischemic infarct. Scattered remote lacunar infarcts within the bilateral basal ganglia. Wallerian degeneration within the left cerebral peduncle and left midbrain. Probable small remote lacunar infarct within the pons as well. Small focus of susceptibility artifact consistent with remote chronic micro hemorrhage within this region, stable. Recently identified small ischemic infarct within the left periatrial white matter again seen, stable. No interval hemorrhage or mass effect. There is a punctate 4 mm focus of diffusion abnormality within the posterior limb of the right internal capsule, which may reflect a small acute ischemic infarct (series 4, image 28). No associated hemorrhage or mass effect. No other acute infarct. Gray-white matter differentiation otherwise maintained. Major intracranial vascular flow voids are preserved. Ectasia of the intracranial circulation noted. Is no mass lesion, midline shift, or mass effect. No hydrocephalus. No extra-axial fluid collection. Major dural sinuses are grossly patent. Craniocervical junction within normal limits. Visualized upper cervical spine unremarkable. Pituitary gland within normal limits. No acute abnormality about the globes and orbits. Mild mucosal thickening within the maxillary sinuses. Paranasal sinuses are otherwise clear. No mastoid effusion. Inner ear structures normal. Bone marrow signal intensity within normal limits. No scalp soft tissue abnormality. IMPRESSION: 1. Punctate 4 mm focus of diffusion abnormality within the posterior limb of the right internal capsule, suspicious for a possible small acute ischemic infarct. 2. Normal expected interval evolution of small left periatrial white matter infarct. 3. Otherwise stable appearance of the brain with chronic findings as above. Electronically Signed   By:  Jeannine Boga M.D.   On: 07/12/2015 06:09        Scheduled Meds: . amLODipine  5 mg Oral QHS  . aspirin EC  81 mg Oral Daily  . atorvastatin  80 mg Oral q1800  . clopidogrel  75 mg Oral Daily  . ferrous sulfate  325 mg Oral BID WC  . furosemide  20 mg Oral Daily  . heparin subcutaneous  5,000 Units Subcutaneous Q8H  . insulin aspart  0-9 Units Subcutaneous TID WC  . insulin glargine  15 Units Subcutaneous QHS  . levofloxacin  500 mg Oral Q48H  . metoprolol succinate  50 mg Oral Daily  . pantoprazole  40 mg Oral Daily  . pregabalin  50 mg Oral BID   Continuous Infusions: . sodium chloride Stopped (07/13/15 1520)  . sodium chloride 75 mL/hr at 07/13/15 1522     LOS: 1 day    Time spent: 35 minutes.  Hosie Poisson, MD [facility] Pager [phone]  If 7PM-7AM, please contact night-coverage [email or web address] Password [email or web address] 07/13/2015, 4:00 PM

## 2015-07-13 NOTE — Progress Notes (Signed)
SLP Cancellation Note  Patient Details Name: [name] MRN: [id number] DOB: September 07, 1950   Cancelled treatment:       Reason Eval/Treat Not Completed: SLP screened, no needs identified, will sign off. Pt at baseline, communicates well. No f/u needed.    Khandi Kernes, Katherene Ponto 07/13/2015, 1:17 PM

## 2015-07-14 ENCOUNTER — Inpatient Hospital Stay (HOSPITAL_COMMUNITY): Payer: Medicare Other

## 2015-07-14 LAB — ECHOCARDIOGRAM COMPLETE
E decel time: 385 msec
E/e' ratio: 8.85
FS: 44 % (ref 28–44)
IVS/LV PW RATIO, ED: 1.14
LA ID, A-P, ES: 47 mm
LA diam end sys: 47 mm
LA diam index: 2.12 cm/m2
LA vol A4C: 57.8 ml
LA vol index: 25 mL/m2
LA vol: 55.3 mL
LV E/e' medial: 8.85
LV E/e'average: 8.85
LV PW d: 11.6 mm — AB (ref 0.6–1.1)
LV e' LATERAL: 7.07 cm/s
LVOT area: 3.14 cm2
LVOT diameter: 20 mm
MV Dec: 385
MV pk A vel: 92.2 m/s
MV pk E vel: 62.6 m/s
TAPSE: 14.1 mm
TDI e' lateral: 7.07
TDI e' medial: 4.03
Weight: 3616 oz

## 2015-07-14 LAB — GLUCOSE, CAPILLARY
Glucose-Capillary: 168 mg/dL — ABNORMAL HIGH (ref 65–99)
Glucose-Capillary: 198 mg/dL — ABNORMAL HIGH (ref 65–99)

## 2015-07-14 MED ORDER — LYRICA 50 MG PO CAPS: 50.0000 mg | ORAL_CAPSULE | Freq: Every day | ORAL | Status: DC

## 2015-07-14 MED ORDER — ASPIRIN EC 325 MG PO TBEC: 325.0000 mg | DELAYED_RELEASE_TABLET | Freq: Every day | ORAL | Status: DC

## 2015-07-14 MED ORDER — OXYCODONE HCL 20 MG PO TABS: ORAL_TABLET | ORAL | Status: DC

## 2015-07-14 NOTE — Discharge Summary (Addendum)
[name], is a 65 y.o. female  DOB 03/23/1950  MRN [id number].  Admission date:  07/12/2015  Admitting Physician  Norval Morton, MD  Discharge Date:  07/14/2015   Primary MD  Ricke Hey, MD  Recommendations for primary care physician for things to follow:  - Please CBC, BMP during next visit - Patient to follow with neurology in 2 month  Admission Diagnosis  Acute CVA (cerebrovascular accident) [facility] [I63.9]   Discharge Diagnosis  Acute CVA (cerebrovascular accident) ([place]) [I63.9]    Principal Problem:   Acute CVA (cerebrovascular accident) [facility]) Active Problems:   Sleep apnea   Morbid obesity ([place])   CHF (congestive heart failure) ([place])   Chronic renal failure   CAD (coronary artery disease)   Altered mental status   Right sided weakness   CVA (cerebral vascular accident) ([place])   AKI (acute kidney injury) ([place])   Lethargy   DM type 2 with diabetic peripheral neuropathy ([place])   Hyperlipidemia   Chronic obstructive pulmonary disease (HCC)   Hemiparesis, aphasia, and dysphagia as late effect of cerebrovascular accident (CVA) ([place])   Spastic hemiplegia affecting nondominant side ([place])   Acute encephalopathy   Chronic kidney disease (CKD), stage IV (severe) ([place])      Past Medical History  Diagnosis Date  . Emphysema   . Colon cancer ([place])   . Hypertension   . Hypercholesteremia   . Diabetes mellitus   . COPD (chronic obstructive pulmonary disease) ([place])   . Coronary artery disease   . Obesity   . Sleep apnea   . TIA (transient ischemic attack)   . CHF (congestive heart failure) ([place])   . Anemia   . DJD (degenerative joint disease)   . Renal insufficiency   . Chronic back pain   . Scoliosis   . Obesity hypoventilation syndrome ([place])   . Stroke ([place])   . Myocardial infarction [facility])     Past Surgical History  Procedure Laterality Date  . Coronary angioplasty with  stent placement    . Colon surgery         History of present illness and  Hospital Course:     Kindly see H&P for history of present illness and admission details, please review complete Labs, Consult reports and Test reports for all details in brief  HPI  from the history and physical done on the day of admission 07/12/2015  HPI: Michelle Holmes is a 65 y.o. female with medical history significant for HTN, HLD, CKD, DM, COPD, CAD s/p MI, OSA, CVA in 12/2014 involving the L thalamus and L cerebellar peduncle, and most recently on 07/04/15 L CVA parietal region, with mild R residual hemiparesis, currently on dual antiplatelet therapy with aspirin and Plavix as well as statins, rought to the emergency room for evaluation and management of altered mental status and unresponsiveness. She was last seen normal at 7 PM. Per chart report, No apparent vertigo dizziness or vision change or headaches. She is very somnolent and obtunded, cannot follow  commands at this time. She is recovering from recent UTI treated with Cipro. Chart states Family reports these are symptoms similar to the ones in prior CVAs. Unclear of tPA status, awaiting Neuro recommendations Will admit for further evaluation and treatment.   ED Course: BP 164/81 mmHg  Pulse 66  Temp(Src) 98.3 F (36.8 C) (Oral)  Resp 14  SpO2 100%  CT head negative for acute intracranial abnormalities MRI today reveals what appears to be acute stroke within the posterior limb of the right internal capsule EKG NSR without ACS. Cr 3.1 (BL2.5)  Glucose 211  Hospital Course   Acute CVA - Patient presents with altered mental status, multifactorial, secondary to metabolic encephalopathy in the setting of worsening renal function, but MRI showing acute CVA. - MRI brain Punctate 4 mm focus of diffusion abnormality within the posterior limb of the right internal capsule, suspicious for a possible small acute ischemic infarct. - She didn't had recent  2-D echo and carotid Doppler done with no significant abnormalities, no need to repeat. - Discussed with the neurology Dr.Xu, this was felt to be secondary to severe atheromatosis disease, patient on aspirin 81 mg oral daily, and Plavix at home, to be discharged on aspirin 325 mg oral daily and Plavix for total of 3 month, then to continue with Plavix only.  Acute encephalopathy - Multifactorial in the setting of dehydration, acute CVA, worsening renal function, renal function back to baseline, will discontinue Lasix, will decrease Lyrica from twice a day to once daily. - Abnormal EEG with evidence of mild generalized slowing of brain activity.  Type 2 diabetes mellitus - Continue with home regimen  Hypertension - Continue with home regimen  Hyperlipidemia - Continue with home regimen  History of present illness on CK D stage IV - Baseline creatinine 2.5-2.7, 3.11 on admission, resolved after gentle hydration and holding Lasix, will discontinue Lasix on discharge    Discharge Condition:  Stable   Follow UP  Follow-up Information    Follow up with Ricke Hey, MD. Schedule an appointment as soon as possible for a visit in 1 week.   Specialty:  Family Medicine   Contact information:   [place] [address] [phone]       Follow up with Dennie Bible, NP. Schedule an appointment as soon as possible for a visit in 2 months.   Specialty:  Family Medicine   Why:  stroke clinic   Contact information:   [address] [place] [address] [phone]         Discharge Instructions  and  Discharge Medications         Discharge Instructions    Ambulatory referral to Neurology    Complete by:  As directed   Follow up with Cecille Rubin, NP, at [facility] in about 2 months. Thanks.     Diet - low sodium heart healthy    Complete by:  As directed      Discharge instructions    Complete by:  As directed   Follow up with  Neurology in 1 to 2 weeks post discharge.  Please follow up with primary care physician in one week.            Medication List    STOP taking these medications        ciprofloxacin 500 MG tablet  Commonly known as:  CIPRO     cyclobenzaprine 10 MG tablet  Commonly known as:  FLEXERIL  furosemide 20 MG tablet  Commonly known as:  LASIX      TAKE these medications        ADVAIR DISKUS 250-50 MCG/DOSE Aepb  Generic drug:  Fluticasone-Salmeterol  Inhale 1 puff into the lungs daily as needed (shortness of breath).     amLODipine 5 MG tablet  Commonly known as:  NORVASC  Take 5 mg by mouth at bedtime.     aspirin EC 325 MG tablet  Take 1 tablet (325 mg total) by mouth daily.     atorvastatin 80 MG tablet  Commonly known as:  LIPITOR  Take 1 tablet (80 mg total) by mouth daily at 6 PM.     clopidogrel 75 MG tablet  Commonly known as:  PLAVIX  Take 1 tablet (75 mg total) by mouth daily.     fenofibrate 54 MG tablet  Take 1 tablet (54 mg total) by mouth daily.     ferrous sulfate 325 (65 FE) MG tablet  Take 1 tablet (325 mg total) by mouth 2 (two) times daily with a meal.     glipiZIDE 5 MG tablet  Commonly known as:  GLUCOTROL  Take 1 tablet (5 mg total) by mouth daily before breakfast.     LANTUS SOLOSTAR 100 UNIT/ML Solostar Pen  Generic drug:  Insulin Glargine  Inject 30 Units into the skin at bedtime.     LYRICA 50 MG capsule  Generic drug:  pregabalin  Take 1 capsule (50 mg total) by mouth daily.     metoprolol succinate 50 MG 24 hr tablet  Commonly known as:  TOPROL-XL  Take 50 mg by mouth daily. Take with or immediately following a meal.     Oxycodone HCl 20 MG Tabs  Please take 10 mg oral 2 times daily, and try to wean .     pantoprazole 40 MG tablet  Commonly known as:  PROTONIX  Take 40 mg by mouth daily.          Diet and Activity recommendation: See Discharge Instructions above   Consults obtained -  Neurology   Major  procedures and Radiology Reports - PLEASE review detailed and final reports for all details, in brief -     Ct Head Wo Contrast  07/12/2015  CLINICAL DATA:  Altered mental status. History of hypertension, diabetes, TIA, stroke EXAM: CT HEAD WITHOUT CONTRAST TECHNIQUE: Contiguous axial images were obtained from the base of the skull through the vertex without intravenous contrast. COMPARISON:  MRI brain 07/04/2015.  CT head 07/04/2015. FINDINGS: Diffuse cerebral atrophy. Mild ventricular dilatation consistent with central atrophy. Old area of low attenuation in the left frontal lobe consistent with old infarct. Low-attenuation changes in the deep white matter consistent with small vessel ischemia. No mass effect or midline shift. No abnormal extra-axial fluid collections. Gray-white matter junctions are distinct. Basal cisterns are not effaced. No evidence of acute intracranial hemorrhage. No depressed skull fractures. Mucosal thickening in the paranasal sinuses. Mastoid air cells are not opacified. Vascular calcifications. No change since prior study. IMPRESSION: No acute intracranial abnormalities. Diffuse atrophy and small vessel ischemic changes. Old infarct in the left frontal lobe. Electronically Signed   By: Lucienne Capers M.D.   On: 07/12/2015 02:29   Ct Head Wo Contrast  07/04/2015  CLINICAL DATA:  Syncopal episode cough personal history of stroke, unresponsive EXAM: CT HEAD WITHOUT CONTRAST TECHNIQUE: Contiguous axial images were obtained from the base of the skull through the vertex without intravenous contrast. COMPARISON:  05/06/2015  MRI FINDINGS: Mild diffuse cortical atrophy with moderate low attenuation in the deep white matter. Encephalomalacia left frontal parietal region stable from prior MRI. No evidence of acute vascular territory infarct or mass. No parenchymal hemorrhage or extra-axial fluid. Age-related basal ganglia calcification bilaterally. No hydrocephalus. Calvarium intact.  IMPRESSION: Chronic ischemic change with no acute findings Electronically Signed   By: Skipper Cliche M.D.   On: 07/04/2015 16:02   Mr Brain Wo Contrast  07/12/2015  CLINICAL DATA:  Initial evaluation acute altered mental status. EXAM: MRI HEAD WITHOUT CONTRAST TECHNIQUE: Multiplanar, multiecho pulse sequences of the brain and surrounding structures were obtained without intravenous contrast. COMPARISON:  Air CT from earlier the same day as well as previous MRI from 07/04/2015. FINDINGS: Diffuse prominence of the CSF containing spaces is compatible with generalized cerebral atrophy. Patchy and confluent T2/FLAIR hyperintensity within the periventricular and deep white matter both cerebral hemispheres most consistent with chronic small vessel ischemic, moderate nature. Chronic small vessel ischemic changes present within the pons as well. Encephalomalacia with associated laminar necrosis within the anterior left frontal lobe, compatible with remote ischemic infarct. Scattered remote lacunar infarcts within the bilateral basal ganglia. Wallerian degeneration within the left cerebral peduncle and left midbrain. Probable small remote lacunar infarct within the pons as well. Small focus of susceptibility artifact consistent with remote chronic micro hemorrhage within this region, stable. Recently identified small ischemic infarct within the left periatrial white matter again seen, stable. No interval hemorrhage or mass effect. There is a punctate 4 mm focus of diffusion abnormality within the posterior limb of the right internal capsule, which may reflect a small acute ischemic infarct (series 4, image 28). No associated hemorrhage or mass effect. No other acute infarct. Gray-white matter differentiation otherwise maintained. Major intracranial vascular flow voids are preserved. Ectasia of the intracranial circulation noted. Is no mass lesion, midline shift, or mass effect. No hydrocephalus. No extra-axial fluid  collection. Major dural sinuses are grossly patent. Craniocervical junction within normal limits. Visualized upper cervical spine unremarkable. Pituitary gland within normal limits. No acute abnormality about the globes and orbits. Mild mucosal thickening within the maxillary sinuses. Paranasal sinuses are otherwise clear. No mastoid effusion. Inner ear structures normal. Bone marrow signal intensity within normal limits. No scalp soft tissue abnormality. IMPRESSION: 1. Punctate 4 mm focus of diffusion abnormality within the posterior limb of the right internal capsule, suspicious for a possible small acute ischemic infarct. 2. Normal expected interval evolution of small left periatrial white matter infarct. 3. Otherwise stable appearance of the brain with chronic findings as above. Electronically Signed   By: Jeannine Boga M.D.   On: 07/12/2015 06:09   Mr Brain Wo Contrast (neuro Protocol)  07/04/2015  CLINICAL DATA:  65 year old hypertensive diabetic female with high cholesterol. History of COPD. Colon cancer. Syncopal episode in wheelchair. Prior infarct with baseline right-sided weakness and slurred speech. Recent change in blood pressure medication. Subsequent encounter. EXAM: MRI HEAD WITHOUT CONTRAST TECHNIQUE: Multiplanar, multiecho pulse sequences of the brain and surrounding structures were obtained without intravenous contrast. COMPARISON:  07/04/2015 head CT.  05/06/2015 brain MR. FINDINGS: Exam is motion degraded.  Fast technique imaging had a be utilized. Tiny acute nonhemorrhagic left periatrial infarct. Remote small to moderate-size anterior left frontal lobe infarct with encephalomalacia. Remote posterior left lenticular nucleus infarct. Moderate chronic microvascular changes. Tiny area blood breakdown products right pons probably related to prior hemorrhagic ischemia. Artifact left supraorbital region. Wallerian degeneration with small left cerebral peduncle. Global atrophy without  hydrocephalus.  No intracranial mass lesion noted on this unenhanced exam. Ectatic vertebral arteries and basilar artery. Narrowing of the right vertebral artery may be present. Internal carotid arteries are patent. Exophthalmos.  Symmetric normal extra-ocular muscles. IMPRESSION: Tiny acute nonhemorrhagic left periatrial infarct. Remainder findings appear chronic as detailed above. Electronically Signed   By: Genia Del M.D.   On: 07/04/2015 19:02   Dg Chest Port 1 View  07/04/2015  CLINICAL DATA:  Syncope. EXAM: PORTABLE CHEST 1 VIEW COMPARISON:  January 14, 2015 FINDINGS: Stable cardiomediastinal silhouette. No pneumothorax. No pulmonary nodules, masses, or focal infiltrates. Scoliotic changes in the spine again identified. IMPRESSION: No active disease. Electronically Signed   By: Dorise Bullion III M.D   On: 07/04/2015 15:42   Mr Jodene Nam Head/brain Wo Cm  07/04/2015  CLINICAL DATA:  Initial evaluation for acute stroke. EXAM: MRA HEAD WITHOUT CONTRAST TECHNIQUE: Angiographic images of the Circle of Willis were obtained using MRA technique without intravenous contrast. COMPARISON:  Previous brain MRI and head CT from earlier the same day. Comparison also made with prior MRA from 05/06/2015. FINDINGS: ANTERIOR CIRCULATION: Study degraded by motion artifact. Visualized distal cervical segments of the internal carotid arteries are tortuous but patent with antegrade flow. Petrous, cavernous, and supraclinoid segments are patent without flow limiting stenosis. A1 segments patent. Anterior communicating artery within normal limits. Anterior cerebral arteries well opacified to their distal aspects. Left ACA slightly attenuated as compared to the right, stable. M1 segments patent without stenosis or occlusion. MCA bifurcations normal. Distal small vessel atheromatous irregularity within the MCA branches bilaterally. POSTERIOR CIRCULATION: Vertebral arteries patent to the vertebrobasilar junction without flow-limiting  stenosis. Left posterior inferior cerebral artery patent. Right posterior inferior cerebral artery not visualized on this exam. Basilar artery widely patent without basilar tip stenosis. Left superior cerebellar artery patent with atheromatous irregularity. Right superior cerebral artery is hypoplastic but grossly patent. Both posterior cerebral arteries arise from the basilar artery. There is a severe stenosis at the proximal right PCA. Right PCA is attenuated distally. Severe atheromatous irregularity with stenoses present within the mid left PCA is well. No aneurysm or vascular malformation. IMPRESSION: 1. No large vessel occlusion within the intracranial circulation. 2. Severe atheromatous disease involving the posterior cerebral arteries bilaterally, with severe stenosis at the proximal right PCA, stable from prior. 3. Moderate atheromatous irregularity within the distal branches of the anterior circulation, also stable from prior. Electronically Signed   By: Jeannine Boga M.D.   On: 07/04/2015 23:10    Micro Results    Recent Results (from the past 240 hour(s))  Urine culture     Status: Abnormal   Collection Time: 07/04/15  4:09 PM  Result Value Ref Range Status   Specimen Description URINE, CLEAN CATCH  Final   Special Requests NONE  Final   Culture >=100,000 COLONIES/mL ENTEROCOCCUS SPECIES (A)  Final   Report Status 07/07/2015 FINAL  Final   Organism ID, Bacteria ENTEROCOCCUS SPECIES (A)  Final      Susceptibility   Enterococcus species - MIC*    AMPICILLIN <=2 SENSITIVE Sensitive     LEVOFLOXACIN 0.5 SENSITIVE Sensitive     NITROFURANTOIN <=16 SENSITIVE Sensitive     VANCOMYCIN 1 SENSITIVE Sensitive     * >=100,000 COLONIES/mL ENTEROCOCCUS SPECIES  Culture, Urine     Status: Abnormal   Collection Time: 07/12/15 12:21 PM  Result Value Ref Range Status   Specimen Description URINE, CLEAN CATCH  Final   Special Requests NONE  Final  Culture MULTIPLE SPECIES PRESENT, SUGGEST  RECOLLECTION (A)  Final   Report Status 07/13/2015 FINAL  Final  Culture, blood (Routine X 2) w Reflex to ID Panel     Status: None (Preliminary result)   Collection Time: 07/12/15 12:50 PM  Result Value Ref Range Status   Specimen Description BLOOD LEFT ANTECUBITAL  Final   Special Requests BOTTLES DRAWN AEROBIC ONLY 5CC  Final   Culture NO GROWTH 1 DAY  Final   Report Status PENDING  Incomplete  Culture, blood (Routine X 2) w Reflex to ID Panel     Status: None (Preliminary result)   Collection Time: 07/12/15 12:55 PM  Result Value Ref Range Status   Specimen Description BLOOD LEFT HAND  Final   Special Requests BOTTLES DRAWN AEROBIC ONLY 5CC  Final   Culture NO GROWTH 1 DAY  Final   Report Status PENDING  Incomplete       Today   Subjective:   Michelle Holmes today has no headache,no chest abdominal pain,no new weakness tingling or numbness, feels much better wants to go home today.   Objective:   Blood pressure 154/55, pulse 56, temperature 98.6 F (37 C), temperature source Oral, resp. rate 20, SpO2 98 %.   Intake/Output Summary (Last 24 hours) at 07/14/15 1216 Last data filed at 07/14/15 0400  Gross per 24 hour  Intake   1665 ml  Output    500 ml  Net   1165 ml    Exam Awake Alert, Oriented x 3, N Supple Neck,No JVD,  Symmetrical Chest wall movement, Good air movement bilaterally, CTAB RRR,No Gallops,Rubs or new Murmurs, No Parasternal Heave +ve B.Sounds, Abd Soft, Non tender,  No Cyanosis, Clubbing or edema,   Data Review   CBC w Diff:  Lab Results  Component Value Date   WBC 7.8 07/12/2015   WBC 11.8* 07/13/2005   HGB 10.3* 07/12/2015   HGB 11.9 07/13/2005   HCT 31.7* 07/12/2015   HCT 34.8 07/13/2005   PLT 165 07/12/2015   PLT 223 07/13/2005   LYMPHOPCT 36 07/06/2015   LYMPHOPCT 61.2* 07/13/2005   BANDSPCT 0 12/12/2010   MONOPCT 8 07/06/2015   MONOPCT 5.6 07/13/2005   EOSPCT 3 07/06/2015   EOSPCT 2.5 07/13/2005   BASOPCT 0 07/06/2015   BASOPCT  0.3 07/13/2005    CMP:  Lab Results  Component Value Date   NA 141 07/13/2015   K 3.8 07/13/2015   CL 114* 07/13/2015   CO2 22 07/13/2015   BUN 38* 07/13/2015   CREATININE 2.76* 07/13/2015   PROT 7.0 07/12/2015   ALBUMIN 3.6 07/12/2015   BILITOT 0.2* 07/12/2015   ALKPHOS 62 07/12/2015   AST 15 07/12/2015   ALT 12* 07/12/2015  .   Total Time in preparing paper work, data evaluation and todays exam - 35 minutes  Mckynleigh Mussell M.D on 07/14/2015 at 12:16 PM  [facility]   Office  [phone]

## 2015-07-14 NOTE — Discharge Instructions (Signed)
Follow with Primary MD Ricke Hey, MD in 7 days   Get CBC, CMP,checked  by Primary MD next visit.    Activity: As tolerated with Full fall precautions use walker/cane & assistance as needed   Disposition Home    Diet: Heart Healthy , Carb Modified , with feeding assistance and aspiration precautions.  For Heart failure patients - Check your Weight same time everyday, if you gain over 2 pounds, or you develop in leg swelling, experience more shortness of breath or chest pain, call your Primary MD immediately. Follow Cardiac Low Salt Diet and 1.5 lit/day fluid restriction.   On your next visit with your primary care physician please Get Medicines reviewed and adjusted.   Please request your Prim.MD to go over all Hospital Tests and Procedure/Radiological results at the follow up, please get all Hospital records sent to your Prim MD by signing hospital release before you go home.   If you experience worsening of your admission symptoms, develop shortness of breath, life threatening emergency, suicidal or homicidal thoughts you must seek medical attention immediately by calling 911 or calling your MD immediately  if symptoms less severe.  You Must read complete instructions/literature along with all the possible adverse reactions/side effects for all the Medicines you take and that have been prescribed to you. Take any new Medicines after you have completely understood and accpet all the possible adverse reactions/side effects.   Do not drive, operating heavy machinery, perform activities at heights, swimming or participation in water activities or provide baby sitting services if your were admitted for syncope or siezures until you have seen by Primary MD or a Neurologist and advised to do so again.  Do not drive when taking Pain medications.    Do not take more than prescribed Pain, Sleep and Anxiety Medications  Special Instructions: If you have smoked or chewed Tobacco  in the  last 2 yrs please stop smoking, stop any regular Alcohol  and or any Recreational drug use.  Wear Seat belts while driving.   Please note  You were cared for by a hospitalist during your hospital stay. If you have any questions about your discharge medications or the care you received while you were in the hospital after you are discharged, you can call the unit and asked to speak with the hospitalist on call if the hospitalist that took care of you is not available. Once you are discharged, your primary care physician will handle any further medical issues. Please note that NO REFILLS for any discharge medications will be authorized once you are discharged, as it is imperative that you return to your primary care physician (or establish a relationship with a primary care physician if you do not have one) for your aftercare needs so that they can reassess your need for medications and monitor your lab values.

## 2015-07-14 NOTE — Progress Notes (Signed)
Pt discharge education and instructions completed with pt and partner Abagail Kitchens at bedside; both voices understanding and denies any questions. Pt IV and telemetry removed; pt discharge home with Abagail Kitchens to transport her home. Pt handed her prescription for aspirin. Pt transported off unit via wheelchair with partner and belongings to the side. Delia Heady RN

## 2015-07-14 NOTE — Progress Notes (Signed)
CM received call to arrange for a hospital bed to be delivered to pt's home.  CM called [facility] rep, Tiffany to please authorize and to [facility] DME rep Jeneen Rinks to please arrange for delivery.  No other CM needs communicated.

## 2015-07-15 DIAGNOSIS — I509 Heart failure, unspecified: Secondary | ICD-10-CM | POA: Diagnosis not present

## 2015-07-16 ENCOUNTER — Ambulatory Visit: Payer: 59 | Admitting: Physical Therapy

## 2015-07-16 ENCOUNTER — Ambulatory Visit: Payer: 59 | Admitting: Occupational Therapy

## 2015-07-17 LAB — CULTURE, BLOOD (ROUTINE X 2)
Culture: NO GROWTH
Culture: NO GROWTH

## 2015-07-18 ENCOUNTER — Ambulatory Visit: Payer: 59 | Admitting: Physical Therapy

## 2015-07-18 ENCOUNTER — Ambulatory Visit: Payer: 59 | Admitting: Occupational Therapy

## 2015-07-19 ENCOUNTER — Ambulatory Visit: Payer: Self-pay | Admitting: Registered Nurse

## 2015-07-22 ENCOUNTER — Ambulatory Visit: Payer: 59 | Admitting: Occupational Therapy

## 2015-07-22 ENCOUNTER — Ambulatory Visit: Payer: 59 | Admitting: Physical Therapy

## 2015-07-22 DIAGNOSIS — R2689 Other abnormalities of gait and mobility: Secondary | ICD-10-CM

## 2015-07-22 DIAGNOSIS — G8111 Spastic hemiplegia affecting right dominant side: Secondary | ICD-10-CM

## 2015-07-22 DIAGNOSIS — R293 Abnormal posture: Secondary | ICD-10-CM

## 2015-07-22 DIAGNOSIS — R278 Other lack of coordination: Secondary | ICD-10-CM

## 2015-07-22 DIAGNOSIS — M6281 Muscle weakness (generalized): Secondary | ICD-10-CM | POA: Diagnosis not present

## 2015-07-22 DIAGNOSIS — M79601 Pain in right arm: Secondary | ICD-10-CM

## 2015-07-22 DIAGNOSIS — R208 Other disturbances of skin sensation: Secondary | ICD-10-CM

## 2015-07-22 DIAGNOSIS — R41842 Visuospatial deficit: Secondary | ICD-10-CM

## 2015-07-22 DIAGNOSIS — I69318 Other symptoms and signs involving cognitive functions following cerebral infarction: Secondary | ICD-10-CM | POA: Diagnosis not present

## 2015-07-22 DIAGNOSIS — M6289 Other specified disorders of muscle: Secondary | ICD-10-CM | POA: Diagnosis not present

## 2015-07-22 NOTE — Therapy (Signed)
[facility] [address] [place], [place], [address] Phone: [phone]   Fax:  [phone]  Physical Therapy Treatment  Patient Details  Name: Michelle Holmes MRN: [id number] Date of Birth: [birth year] Referring Provider: Alysia Penna, MD  Encounter Date: 07/22/2015      PT End of Session - 07/22/15 1408    Visit Number 17   Number of Visits 25  requesting 8 additional PT visits   Date for PT Re-Evaluation 09/20/15      Past Medical History  Diagnosis Date  . Emphysema   . Colon cancer ([place])   . Hypertension   . Hypercholesteremia   . Diabetes mellitus   . COPD (chronic obstructive pulmonary disease) ([place])   . Coronary artery disease   . Obesity   . Sleep apnea   . TIA (transient ischemic attack)   . CHF (congestive heart failure) ([place])   . Anemia   . DJD (degenerative joint disease)   . Renal insufficiency   . Chronic back pain   . Scoliosis   . Obesity hypoventilation syndrome ([place])   . Stroke ([place])   . Myocardial infarction [facility])     Past Surgical History  Procedure Laterality Date  . Coronary angioplasty with stent placement    . Colon surgery      There were no vitals filed for this visit.      Subjective Assessment - 07/22/15 1023    Subjective Pt reports she was hospitalized recently. When asked if she feels difference, pt replies, "Better.Marland KitchenMarland KitchenI feel better."   Pertinent History PMH significant for: CVA x4, HTN, HLD, CHF, COPD, OSA, CAD, DM II, colon cancer, DJD, scoliosis, chronic renal insufficiency   Patient Stated Goals "I want a new wheelchair and I want to walk."   Currently in Pain? No/denies                         [facility] Adult PT Treatment/Exercise - 07/22/15 0001    Bed Mobility   Supine to Sit 5: Supervision   Supine to Sit Details (indicate cue type and reason) cueing for technique to initiate rolling from supine > L sidelying   Sitting - Scoot to Edge of  Bed 4: Min assist   Sit to Supine 3: Mod assist   Sit to Supine - Details (indicate cue type and reason) Pt unable to manage RLE using LLE; required mod A for BLE management   Transfers   Transfers Sit to Stand;Stand to Sit   Sit to Stand 3: Mod assist;2: Max assist   Sit to Stand Details Tactile cues for initiation;Manual facilitation for weight shifting;Manual facilitation for placement   Sit to Stand Details (indicate cue type and reason) When pt not wearing R AFO, performed sit > stand from EOM with mod A with chair in front (to increase pt comfort, decrease fear of falling). When pt wearing R AFO for DF assist and to prevent R knee buckling, performed sit > stand x1 from EOM to R PFRW with max A   Stand to Sit 4: Min assist   Stand to Sit Details (indicate cue type and reason) Tactile cues for weight shifting;Verbal cues for precautions/safety   Stand Pivot Transfers --   Squat Pivot Transfers 1: +2 Total assist   Squat Pivot Transfer Details (indicate cue type and reason) +2A due to pt need to urgently use bathroom  PT Short Term Goals - 07-31-15 1918    PT SHORT TERM GOAL #1   Title STG's = LTG's               PT Long Term Goals - Jul 31, 2015 1918    PT LONG TERM GOAL #1   Title Pt will consistently perform sit <> stand from mat table with min A using LRAD.  (Target date: 08/19/15)   Status New   PT LONG TERM GOAL #2   Title Pt will consistently transfer from w/c <> mat table with mod A using LRAD to indicate increased safety with functional transfers.  (08/19/15)   Status New   PT LONG TERM GOAL #3   Title Pt will tolerate standing with total A of standing frame for > 5 minutes to indicate increased standing tolerance.  (08/19/15)   Status New   PT LONG TERM GOAL #4   Title Will trial varying types of carbon fiber AFO's during PT sessions to determine if pt appropriate candidate for off-the-shelf AFO.  (08/19/15)   Status New                 Plan - July 31, 2015 1403    Clinical Impression Statement Per chart, pt hospitalized from 6/6 - 07/04/15 due to status change, AMS. MRI of brain shower punctate 4 mm focus of diffusion abnormality within the posterior limb of the right internal capsule, suspicious for a possible small acute ischemic infarct. Current session focused on assessing functional mobility and modifying PT POC accordingly. PT reassessment reveals the following: decreased activity tolerance, increased assistance required for functional transfers. Pt continues to demo significant spastic R hemiplegia associated with prior CVA. Deferred PASS, as patient declined to continue bed mobility due to discomfort in supine position. Patient expressing strong desire to stop addressing bed mobility in PT; therefore, all goals for bed mobility and PASS deferred. Pt wishes to focus on ambulation; however, pt continues to require up to max A for functional transfers. Pt will continue to benefit from skilled outpatient PT 2x/week for 4 weeks to address impairments outlines below.    Rehab Potential Good   Clinical Impairments Affecting Rehab Potential cognitive impairments; limited family support; relies on friend for transportation   PT Frequency 2x / week   PT Duration 4 weeks   PT Treatment/Interventions ADLs/Self Care Home Management;DME Instruction;Vestibular;Gait training;Neuromuscular re-education;Stair training;Functional mobility training;Therapeutic activities;Patient/family education;Therapeutic exercise;Balance training;Prosthetic Training;Orthotic Fit/Training;Wheelchair mobility training   PT Next Visit Plan Trial standing frame for tone management and standing tolerance. **Assess vital signs during activity.   Recommended Other Services [facility] off-the-shelf R AFO's in therapy to determine if pt appropriate candidate for carbon fiber AFO.   Consulted and Agree with Plan of Care Patient      Patient will benefit from  skilled therapeutic intervention in order to improve the following deficits and impairments:  Postural dysfunction, Impaired tone, Abnormal gait, Decreased balance, Decreased activity tolerance, Decreased endurance, Decreased coordination, Decreased mobility, Impaired perceived functional ability, Impaired sensation, Decreased knowledge of use of DME, Decreased strength, Increased edema, Impaired vision/preception, Decreased cognition  Visit Diagnosis: Spastic hemiplegia affecting right dominant side (HCC)  Other abnormalities of gait and mobility  Other disturbances of skin sensation  Abnormal posture       G-Codes - July 31, 2015 1911    Functional Assessment Tool Used Unable to complete PASS due to pt discomfort with bed mobility; required mod to max A for functional transfers   Functional Limitation Changing and maintaining body position  Changing and Maintaining Body Position Current Status [phone]) At least 40 percent but less than 60 percent impaired, limited or restricted   Changing and Maintaining Body Position Goal Status [id number]) At least 20 percent but less than 40 percent impaired, limited or restricted      Problem List Patient Active Problem List   Diagnosis Date Noted  . Chronic kidney disease (CKD), stage IV (severe) ([place]) 07/06/2015  . Acute CVA (cerebrovascular accident) ([place])   . Acute encephalopathy 07/04/2015  . Sinus bradycardia 07/04/2015  . Type 2 diabetes mellitus with vascular disease ([place]) 07/04/2015  . UTI (lower urinary tract infection) 07/04/2015  . Parietal lobe infarction ([place]) 07/04/2015  . Spastic hemiplegia affecting nondominant side ([place]) 07/01/2015  . Obesity, morbid ([place]) 07/01/2015  . Absolute anemia   . Thrombocytopenia ([place])   . Acute ischemic VBA thalamic stroke ([place]) 01/18/2015  . AKI (acute kidney injury) ([place])   . Dysarthria   . Lethargy   . DM type 2 with diabetic peripheral neuropathy ([place])   . Labile blood pressure   .  Hyperlipidemia   . History of CVA with residual deficit   . Chronic obstructive pulmonary disease ([place])   . Hemiparesis, aphasia, and dysphagia as late effect of cerebrovascular accident (CVA) ([place])   . Acute ischemic stroke ([place])   . CVA (cerebral vascular accident) ([place]) 01/14/2015  . CVA (cerebral infarction) 01/14/2015  . Right hemiplegia ([place])   . Right sided weakness 01/13/2015  . Abdominal pain 06/20/2014  . Altered mental status 02/08/2012  . Sleep apnea 12/08/2010  . Morbid obesity ([place]) 12/08/2010  . CHF (congestive heart failure) ([place]) 12/08/2010  . Chronic renal failure 12/08/2010  . TIA (transient ischemic attack) 12/08/2010  . CAD (coronary artery disease) 12/08/2010  . Cellulitis of pubic region 12/01/2010  . Uncontrolled type 2 DM with hyperosmolar nonketotic hyperglycemia ([place]) 12/01/2010   Billie Ruddy, PT, DPT [facility] [address] [place], [place], [address] Phone: [phone]   Fax:  [phone] 07/22/2015, 7:38 PM  Name: ANELLY DOVE MRN: [id number] Date of Birth: [birth year]

## 2015-07-22 NOTE — Patient Instructions (Signed)
   Sit at a table, use your right hand over left to help slide both arms across table to stretch out your arm/elbow  Sitting down, use right hand to pull left arm straight down toward the floor (lean over like you are going to touch the floor to stretch out your elbow).

## 2015-07-23 ENCOUNTER — Encounter: Payer: Medicare Other | Admitting: Registered Nurse

## 2015-07-23 ENCOUNTER — Other Ambulatory Visit: Payer: Self-pay

## 2015-07-23 DIAGNOSIS — J449 Chronic obstructive pulmonary disease, unspecified: Secondary | ICD-10-CM | POA: Diagnosis not present

## 2015-07-23 NOTE — Patient Outreach (Signed)
[facility]) Care Management  07/23/2015  Michelle Holmes 11-01-1950 [id number]  REFERRAL SOURCE:  EMMI stroke RED referral REFERRAL REASON:  Problems setting up rehab  Telephone call to patient regarding EMMI stroke RED referral.  Unable to reach patient. HIPAA complaint voice message left with call back phone number.   PLAN; RNCM will attempt 2nd telephone call to patient within 1 week.  Quinn Plowman RN,BSN,CCM [facility] Telephonic  [phone]

## 2015-07-23 NOTE — Therapy (Signed)
[facility] [address] [place], [place], [address] Phone: [phone]   Fax:  [phone]  Occupational Therapy Treatment  Patient Details  Name: Michelle Holmes MRN: [id number] Date of Birth: April 09, 1950 Referring Provider: Dr. Alysia Penna  Encounter Date: 07/22/2015      OT End of Session - 07/23/15 1034    Visit Number 12   Number of Visits 18  12+10=22   Date for OT Re-Evaluation 09/05/15   Authorization Type [employer] Medicare (no visit limit, no auth, G-code needed); medicaid 2nd   Authorization Time Period cert. 0/7/37-1/0/62, re-cert.  6/94/85-4/62/70; re-cert. 07/22/15-09/20/15 due to hospitalizations   Authorization - Visit Number 4  G-code   Authorization - Number of Visits 10   OT Start Time 0935   OT Stop Time 1015   OT Time Calculation (min) 40 min   Activity Tolerance Patient tolerated treatment well   Behavior During Therapy [facility] for tasks assessed/performed      Past Medical History  Diagnosis Date  . Emphysema   . Colon cancer ([place])   . Hypertension   . Hypercholesteremia   . Diabetes mellitus   . COPD (chronic obstructive pulmonary disease) ([place])   . Coronary artery disease   . Obesity   . Sleep apnea   . TIA (transient ischemic attack)   . CHF (congestive heart failure) ([place])   . Anemia   . DJD (degenerative joint disease)   . Renal insufficiency   . Chronic back pain   . Scoliosis   . Obesity hypoventilation syndrome ([place])   . Stroke ([place])   . Myocardial infarction [facility])     Past Surgical History  Procedure Laterality Date  . Coronary angioplasty with stent placement    . Colon surgery      There were no vitals filed for this visit.      Subjective Assessment - 07/23/15 0922    Subjective  Pt reports no functional changes since last hospitalization.  Pt does not want to work on UB dressing today because she didn't wear a bra, but reports that she will next visits.   Pertinent  History CVA 01/13/16, hx of previous CVA with risidual R-sided weakness, hospitalization 05/05/15 with possible TIA or seizure disorder; also hx of HTN, MI, CHF, CAD, DM, COPD, emphysema, sleep apnea, chronic back pain, scoliosis, DJD, renal insufficiency, anemia, and colon cancer.   Patient Stated Goals get back to "all that I did before"; when questioned pt stated that she wanted to work on toileting transfer ( but inconsistent responses)   Currently in Pain? No/denies      Self-care:  Functional mobility:  transfer w/c>mat toward affected side with mod A and min cueing to scoot.  In sitting, forward wt. shift with min cues to use LUE to assist and min-mod A to move sitting>squat in prep for toilet transfers followed by Sit>stand x5 in prep for ADLs with min-mod A and cueing for controlled sit x5.   Neuro re-ed:    In sitting trunk rotations to each side with hands clasped for incr mobility and min facilitation  In sitting, wt. Shift to the right with RUE supported on ball for AAROM shoulder abduction with min facilitation and incr attention to R side.  In sitting, PROM to RUE in abduction/ER as able with pt performing trunk rotation for incr shoulder mobility.  Reach for the floor stretch with min facilitation for elbow ext.  AAROM shoulder flex/elbow ext with min facilitation with  UE ranger followed by ER/horizontal abduction in small range with min facilitation.  Wt. Bearing on R elbow with attempt to push to sit for tricep activation and incr functional mobility with mod facilitation.  Scapular retraction in sitting with min-mod facilitation/cues.  Manual:  PROM wrist ext and finger flex/ext, composite wrist/finger ext as able within pt tolerance                      OT Education - 07/23/15 1049    Education Details HEP--see pt instructions   Person(s) Educated Patient   Methods Explanation;Demonstration;Verbal cues;Handout;Tactile cues   Comprehension  Verbalized understanding;Returned demonstration;Verbal cues required;Tactile cues required;Need further instruction             OT Long Term Goals - 07/09/15 1558    OT LONG TERM GOAL #1   Title Pt will perform self PROM HEP with mod prompts/cueing.--due 08/07/15   Status On-going  07/09/15 partially met   OT LONG TERM GOAL #2   Title Pt will participate in functional activities for at least 57mn with mod prompts.   Status Achieved   OT LONG TERM GOAL #3   Title Pt will tolerate/allow gentle PROM to RUE at least 75% of the time.   Status Achieved   OT LONG TERM GOAL #4   Title Pt [facility].   Status Achieved   OT LONG TERM GOAL #5   Title Patient will don / doff pull over shirt with no greater than mod assist ( due 08/07/15)   Time 4   Period Weeks   Status New   OT LONG TERM GOAL #6   Title Patient will complete toilet transfer with min assist  (due 08/07/15)   Time 4   Period Weeks   Status New               Plan - 07/23/15 0925    Clinical Impression Statement Progress has been affected by recent hospitalizations and missed visits related to hospitalization.  However, no functional changes noted from recent hospitalizations.  Current POC remains appropriate.   Rehab Potential Fair   Clinical Impairments Affecting Rehab Potential no caregiver support (during rehab affecting carryover)   OT Frequency 2x / week   OT Duration --  5 weeks   OT Treatment/Interventions Self-care/ADL training;Therapeutic exercise;Functional Mobility Training;Patient/family education;Balance training;Splinting;Manual Therapy;Neuromuscular education;Ultrasound;Energy conservation;Manual lymph drainage;Therapeutic exercises;Therapeutic activities;DME and/or AE instruction;Parrafin;Cryotherapy;Electrical Stimulation;Fluidtherapy;Cognitive remediation/compensation;Visual/perceptual remediation/compensation;Passive range of motion;Contrast Bath;Moist Heat    Plan UB dressing, neuro re-ed, transfers   OT Home Exercise Plan Education provided:  edema massage, splint wear/care; HEP 07/22/15   Consulted and Agree with Plan of Care Patient      Patient will benefit from skilled therapeutic intervention in order to improve the following deficits and impairments:  Decreased activity tolerance, Decreased balance, Decreased cognition, Decreased coordination, Decreased endurance, Decreased range of motion, Decreased mobility, Decreased knowledge of use of DME, Decreased knowledge of precautions, Decreased strength, Difficulty walking, Increased edema, Impaired perceived functional ability, Impaired flexibility, Obesity, Impaired UE functional use, Impaired tone, Pain, Impaired vision/preception, Improper body mechanics  Visit Diagnosis: Spastic hemiplegia affecting right dominant side (HCC)  Muscle weakness (generalized)  Other lack of coordination  Other abnormalities of gait and mobility  Visuospatial deficit  Abnormal posture  Pain In Right Arm    Problem List Patient Active Problem List   Diagnosis Date Noted  . Chronic kidney disease (CKD), stage IV (severe) ([place] 07/06/2015  .  Acute CVA (cerebrovascular accident) ([place])   . Acute encephalopathy 07/04/2015  . Sinus bradycardia 07/04/2015  . Type 2 diabetes mellitus with vascular disease ([place]) 07/04/2015  . UTI (lower urinary tract infection) 07/04/2015  . Parietal lobe infarction ([place]) 07/04/2015  . Spastic hemiplegia affecting nondominant side ([place]) 07/01/2015  . Obesity, morbid ([place]) 07/01/2015  . Absolute anemia   . Thrombocytopenia ([place])   . Acute ischemic VBA thalamic stroke ([place]) 01/18/2015  . AKI (acute kidney injury) ([place])   . Dysarthria   . Lethargy   . DM type 2 with diabetic peripheral neuropathy ([place])   . Labile blood pressure   . Hyperlipidemia   . History of CVA with residual deficit   . Chronic obstructive pulmonary disease ([place])   . Hemiparesis, aphasia, and  dysphagia as late effect of cerebrovascular accident (CVA) ([place])   . Acute ischemic stroke ([place])   . CVA (cerebral vascular accident) ([place]) 01/14/2015  . CVA (cerebral infarction) 01/14/2015  . Right hemiplegia ([place])   . Right sided weakness 01/13/2015  . Abdominal pain 06/20/2014  . Altered mental status 02/08/2012  . Sleep apnea 12/08/2010  . Morbid obesity ([place]) 12/08/2010  . CHF (congestive heart failure) ([place]) 12/08/2010  . Chronic renal failure 12/08/2010  . TIA (transient ischemic attack) 12/08/2010  . CAD (coronary artery disease) 12/08/2010  . Cellulitis of pubic region 12/01/2010  . Uncontrolled type 2 DM with hyperosmolar nonketotic hyperglycemia ([place]) 12/01/2010    [facility] 07/23/2015, 10:50 AM  [facility] [address] [place], [place], [address] Phone: [phone]   Fax:  [phone]  Name: NATALEA SUTLIFF MRN: [id number] Date of Birth: February 08, 1950  Vianne Bulls, OTR/L [facility] [address] [place],   [address] [phone] phone [phone] 07/23/2015 10:50 AM

## 2015-07-24 ENCOUNTER — Encounter: Payer: Medicare Other | Attending: Registered Nurse | Admitting: Registered Nurse

## 2015-07-24 ENCOUNTER — Encounter: Payer: Self-pay | Admitting: Registered Nurse

## 2015-07-24 ENCOUNTER — Other Ambulatory Visit: Payer: Self-pay

## 2015-07-24 VITALS — BP 152/82 | HR 68 | Resp 14

## 2015-07-24 DIAGNOSIS — E7800 Pure hypercholesterolemia, unspecified: Secondary | ICD-10-CM | POA: Diagnosis not present

## 2015-07-24 DIAGNOSIS — G8191 Hemiplegia, unspecified affecting right dominant side: Secondary | ICD-10-CM | POA: Insufficient documentation

## 2015-07-24 DIAGNOSIS — G8111 Spastic hemiplegia affecting right dominant side: Secondary | ICD-10-CM

## 2015-07-24 DIAGNOSIS — R2689 Other abnormalities of gait and mobility: Secondary | ICD-10-CM

## 2015-07-24 DIAGNOSIS — I110 Hypertensive heart disease with heart failure: Secondary | ICD-10-CM | POA: Diagnosis not present

## 2015-07-24 DIAGNOSIS — I6381 Other cerebral infarction due to occlusion or stenosis of small artery: Secondary | ICD-10-CM

## 2015-07-24 DIAGNOSIS — I509 Heart failure, unspecified: Secondary | ICD-10-CM | POA: Diagnosis not present

## 2015-07-24 DIAGNOSIS — E119 Type 2 diabetes mellitus without complications: Secondary | ICD-10-CM | POA: Diagnosis not present

## 2015-07-24 DIAGNOSIS — I63212 Cerebral infarction due to unspecified occlusion or stenosis of left vertebral arteries: Secondary | ICD-10-CM

## 2015-07-24 DIAGNOSIS — E662 Morbid (severe) obesity with alveolar hypoventilation: Secondary | ICD-10-CM | POA: Diagnosis not present

## 2015-07-24 DIAGNOSIS — I2510 Atherosclerotic heart disease of native coronary artery without angina pectoris: Secondary | ICD-10-CM | POA: Diagnosis not present

## 2015-07-24 DIAGNOSIS — G8110 Spastic hemiplegia affecting unspecified side: Secondary | ICD-10-CM | POA: Diagnosis not present

## 2015-07-24 DIAGNOSIS — J449 Chronic obstructive pulmonary disease, unspecified: Secondary | ICD-10-CM | POA: Insufficient documentation

## 2015-07-24 DIAGNOSIS — I6322 Cerebral infarction due to unspecified occlusion or stenosis of basilar arteries: Secondary | ICD-10-CM

## 2015-07-24 NOTE — Progress Notes (Addendum)
Subjective:    Patient ID: Michelle Holmes, female    DOB: 1950/02/28, 65 y.o.   MRN: [id number]  HPI: [name] is a 65 year old female who is being seen for a Occupational hygienist. I have reviewed the Physical Therapist wheelchair evaluation form and Concur with their assessment and evaluation. [name] will needThe Tilt and Recline Features on the Power Wheelchair she cannot weight shift and it would help with position change, pressure release, management of tone and ADL( activities od Daily Living) care. She will also need adjustable height arm rest to provide support of elbows 90 degrees. This This will provide her the ability to adjust for various table tops. They will also be removable for transfers. Michelle Holmes doesn't have the ability, strength or endurance to use a manual wheelchair due to Spastic Hemiplegia affecting her Right Dominant Side.    The Power Wheelchair will be a great asset to [name]. This will help her with mobility in her home, also by improving her independence with activities of daily living, such as bathing and dressing.   [name] last weight was recorded  was 226 lbs on 07/04/2015.   Pain Inventory Average Pain 6 Pain Right Now 5 My pain is na  In the last 24 hours, has pain interfered with the following? General activity 3 Relation with others 0 Enjoyment of life 3 What TIME of day is your pain at its worst? morning Sleep (in general) Fair  Pain is worse with: walking, bending and standing Pain improves with: rest and medication Relief from Meds: na  Mobility walk with assistance ability to climb steps?  no do you drive?  no Do you have any goals in this area?  yes  Function disabled: date disabled na I need assistance with the following:  dressing, bathing, toileting, meal prep, household duties and shopping Do you have any goals in this area?  yes  Neuro/Psych trouble walking  Prior Studies Any changes since last  visit?  no  Physicians involved in your care Any changes since last visit?  no   Family History  Problem Relation Age of Onset  . Stroke Maternal Aunt    Social History   Social History  . Marital Status: Widowed    Spouse Name: N/A  . Number of Children: N/A  . Years of Education: N/A   Social History Main Topics  . Smoking status: Former Research scientist (life sciences)  . Smokeless tobacco: None  . Alcohol Use: No  . Drug Use: No  . Sexual Activity: Not Asked   Other Topics Concern  . None   Social History Narrative   Past Surgical History  Procedure Laterality Date  . Coronary angioplasty with stent placement    . Colon surgery     Past Medical History  Diagnosis Date  . Emphysema   . Colon cancer ([place])   . Hypertension   . Hypercholesteremia   . Diabetes mellitus   . COPD (chronic obstructive pulmonary disease) ([place])   . Coronary artery disease   . Obesity   . Sleep apnea   . TIA (transient ischemic attack)   . CHF (congestive heart failure) ([place])   . Anemia   . DJD (degenerative joint disease)   . Renal insufficiency   . Chronic back pain   . Scoliosis   . Obesity hypoventilation syndrome ([place])   . Stroke ([place])   . Myocardial infarction (HCC)    BP 152/82 mmHg  Pulse 68  Resp 14  SpO2 99%  Opioid Risk Score:   Fall Risk Score:  `1  Depression screen PHQ 2/9  No flowsheet data found.     Review of Systems  HENT: Negative.   Eyes: Negative.   Respiratory: Negative.   Gastrointestinal: Negative.   Musculoskeletal: Negative.   All other systems reviewed and are negative.      Objective:   Physical Exam  Constitutional: She appears well-developed and well-nourished.  HENT:  Head: Normocephalic and atraumatic.  Musculoskeletal:  Normal Muscle Bulk and Muscle Testing Reveals: Upper Extremities: Left: Full ROM and Muscle Strength 5/5 Right Hemiparesis/ Right Splint  Lower Extremities: Left: Full ROM and Muscle Strength 5/5 Right: Hemiparesis Arrived in  wheelchair   Nursing note and vitals reviewed.         Assessment & Plan:  1. Acute Ischemic VBA Thalamic Stroke: Continue Physical and Occupational Therapy. 2. Spastic Hemiplegia Affecting Right Dominant Side: Continue Physical and Occupational Therapy.  30 minutes of face to face patient care time was spent during this visit. All questions were encouraged and answered.  F/U in 1 month

## 2015-07-24 NOTE — Patient Outreach (Signed)
[facility]) Care Management  07/24/2015  Michelle Holmes Feb 28, 1950 [id number]  REFERRAL SOURCE: EMMI stroke red alert referral REFERRAL REASON: trouble setting up rehabilitation.  SUBJECTIVE; Telephone call to patient regarding HIPAA verified with patient.  Discussed EMMI stroke transition follow up with patient.  Patient verbally agreed to follow up with RNCM. Patient states she is having outpatient therapy (PT, OT, ST) 2 days per week.  Patient states she had an appointment with her primary MD within the past week. Patient states she is awaiting a wheelchair that was ordered by her primary MD.  Patient requested RNCM speak with Bobbye Riggs Patient gave verbal authorization to speak with Bobbye Riggs regarding all of her medical information. Mr. Marcello Moores states he assists patient with her care and accompanies her to her doctors appointments.  Mr. Abagail Kitchens states patient has a follow up appointment with her primary MD in  July 2017 and cardiologist, Dr. Criss Rosales. RNCM informed Mr. Abagail Kitchens of request for patient to follow up with her neurologist in 2 months per her recent hospital  Discharge summary. Mr. Abagail Kitchens states patient has had several strokes. Mr. Abagail Kitchens abruptly states he is unable to continue conversation at this time and request call at later date.  ASSESSMENT: Per EPIC MEDICAL RECORD NUMBER medical history significant for HTN, HLD, CKD, DM, COPD, CAD s/p MI, OSA, CVA in 12/2014 involving the L thalamus and L cerebellar peduncle, and most recently on 07/04/15 L CVA parietal region, with mild R residual hemiparesis  PLAN:  RNCM will attempt follow up with patient and/or Mr. Trent within 1 week.  Quinn Plowman RN,BSN,CCM [facility] Telephonic  [phone]

## 2015-07-26 ENCOUNTER — Ambulatory Visit: Payer: 59 | Admitting: Occupational Therapy

## 2015-07-26 ENCOUNTER — Ambulatory Visit: Payer: 59 | Admitting: Physical Therapy

## 2015-07-26 VITALS — BP 151/79 | HR 72

## 2015-07-26 DIAGNOSIS — G8111 Spastic hemiplegia affecting right dominant side: Secondary | ICD-10-CM

## 2015-07-26 DIAGNOSIS — R41842 Visuospatial deficit: Secondary | ICD-10-CM

## 2015-07-26 DIAGNOSIS — R208 Other disturbances of skin sensation: Secondary | ICD-10-CM

## 2015-07-26 DIAGNOSIS — R293 Abnormal posture: Secondary | ICD-10-CM | POA: Diagnosis not present

## 2015-07-26 DIAGNOSIS — R2689 Other abnormalities of gait and mobility: Secondary | ICD-10-CM

## 2015-07-26 DIAGNOSIS — M6281 Muscle weakness (generalized): Secondary | ICD-10-CM

## 2015-07-26 DIAGNOSIS — I69318 Other symptoms and signs involving cognitive functions following cerebral infarction: Secondary | ICD-10-CM | POA: Diagnosis not present

## 2015-07-26 DIAGNOSIS — M79601 Pain in right arm: Secondary | ICD-10-CM | POA: Diagnosis not present

## 2015-07-26 DIAGNOSIS — R278 Other lack of coordination: Secondary | ICD-10-CM

## 2015-07-26 DIAGNOSIS — M6289 Other specified disorders of muscle: Secondary | ICD-10-CM | POA: Diagnosis not present

## 2015-07-26 NOTE — Therapy (Addendum)
[facility] [address] [place], [place], [address] Phone: [phone]   Fax:  [phone]  Physical Therapy Treatment  Patient Details  Name: Michelle Holmes MRN: [id number] Date of Birth: [birth year] Referring Provider: Alysia Penna, MD  Encounter Date: 07/26/2015      PT End of Session - 07/26/15 1043    Visit Number 18   Number of Visits 25   Date for PT Re-Evaluation 09/20/15   Authorization Type UHC MCR - G Codes and PN required every 10 visits   PT Start Time [id number]  Pt arrived late to session   PT Stop Time 1015   PT Time Calculation (min) 26 min   Equipment Utilized During Treatment Other (comment)  standing frame   Activity Tolerance Patient tolerated treatment well   Behavior During Therapy [facility] for tasks assessed/performed      Past Medical History  Diagnosis Date  . Emphysema   . Colon cancer ([place])   . Hypertension   . Hypercholesteremia   . Diabetes mellitus   . COPD (chronic obstructive pulmonary disease) ([place])   . Coronary artery disease   . Obesity   . Sleep apnea   . TIA (transient ischemic attack)   . CHF (congestive heart failure) ([place])   . Anemia   . DJD (degenerative joint disease)   . Renal insufficiency   . Chronic back pain   . Scoliosis   . Obesity hypoventilation syndrome ([place])   . Stroke ([place])   . Myocardial infarction [facility])     Past Surgical History  Procedure Laterality Date  . Coronary angioplasty with stent placement    . Colon surgery      Filed Vitals:   07/26/15 0954 07/26/15 1005  BP: 162/86 151/79  Pulse: 66 72        Subjective Assessment - 07/26/15 1018    Subjective "I went to that appointment for my wheelchair. I'm gonna get my new wheelchair soon."   Pertinent History PMH significant for: CVA x4, HTN, HLD, CHF, COPD, OSA, CAD, DM II, colon cancer, DJD, scoliosis, chronic renal insufficiency   Patient Stated Goals "I want a new wheelchair and I want  to walk."   Currently in Pain? No/denies                         [facility] Adult PT Treatment/Exercise - 07/26/15 0001    Transfers   Transfers Sit to Stand;Stand to Sit;Squat Pivot Transfers   Sit to Stand Other (comment);3: Mod assist;1: +1 Total assist;4: Min assist  total A of standing frame initially   Sit to Stand Details Tactile cues for initiation;Manual facilitation for weight shifting;Manual facilitation for placement   Sit to Stand Details (indicate cue type and reason) Performed mutliple sit <> stands from mat table initiated by LUE anterior reaching to facilitate full anterior weight shift; see NMR for details   Stand to Sit 4: Min assist   Stand to Sit Details (indicate cue type and reason) Tactile cues for weight shifting;Verbal cues for precautions/safety   Squat Pivot Transfers 3: Mod assist   Squat Pivot Transfer Details (indicate cue type and reason) from w/c > mat table (to L side)   Neuro Re-ed    Neuro Re-ed Details  Performed static/dynamic standing with use of standing frame x7.5 minutes total (BP WNL, see vitals for details) for symmetrical LE WB, RLE activation, tone management, and postural control. During final 4 minutes  of standing, lowered standing frame sling slightly to facilitate pt performance of final 25% of standing x8 reps to promote active R hip/knee extensor activation while in midline postural alignment. Pt required cueing for full anterior weight shift, RLE WB, and to avoid pulling to stand via LUE use. After seated rest break, transitioned to LUE anterior reaching to initiate sit > partial stand x10 with multimodal cueing for RLE WB, repositioning of RLE after each rep due to tone. Progressed to use of LUE reaching to initiate sit > stand from EOM x3 reps. Static/dynamic standing x3 minutes with emphasis on R hip/knee control during functional weight shifting to R side.                  PT Short Term Goals - 07/22/15 1918    PT  SHORT TERM GOAL #1   Title STG's = LTG's          PT Long Term Goals - 08/02/15 0756    PT LONG TERM GOAL #1   Title Pt will consistently perform sit <> stand from mat table with min A using LRAD.  (Target date: 08/19/15)   Status On-going   PT LONG TERM GOAL #2   Title Pt will consistently transfer from w/c <> mat table with mod A using LRAD to indicate increased safety with functional transfers.  (08/19/15)   Status On-going   PT LONG TERM GOAL #3   Title Pt will tolerate standing with total A of standing frame for > /= 10 minutes to indicate increased standing tolerance.  (08/19/15)   Baseline 6/20: REVISED from 5 to 10 minutes due to pt progress   Status Revised   PT LONG TERM GOAL #4   Title Will trial varying types of carbon fiber AFO's during PT sessions to determine if pt appropriate candidate for off-the-shelf AFO.  (08/19/15)   Status On-going               Plan - 07/26/15 1043    Clinical Impression Statement Session focused on functional transfers, midline orientation and symmetrical WB in standing. Upgraded LTG for standing tolerance, as pt tolerated standing frame x7.5 minutes today without significant change in vital signs or pt discomfort. Independence with transfers is limited by limited initiation of anterior weight shift with sit > stand, poor awarenes/management of RLE position.    Rehab Potential Good   Clinical Impairments Affecting Rehab Potential cognitive impairments; limited family support; relies on friend for transportation   PT Frequency 2x / week   PT Duration 4 weeks   PT Treatment/Interventions ADLs/Self Care Home Management;DME Instruction;Vestibular;Gait training;Neuromuscular re-education;Stair training;Functional mobility training;Therapeutic activities;Patient/family education;Therapeutic exercise;Balance training;Prosthetic Training;Orthotic Fit/Training;Wheelchair mobility training   PT Next Visit Plan Sit <> stand: focus on awareness/management  of RLE placement with transfer setup; full anterior weight shift with sit > stand. Assess vital signs during activity.   Consulted and Agree with Plan of Care Patient      Patient will benefit from skilled therapeutic intervention in order to improve the following deficits and impairments:  Postural dysfunction, Impaired tone, Abnormal gait, Decreased balance, Decreased activity tolerance, Decreased endurance, Decreased coordination, Decreased mobility, Impaired perceived functional ability, Impaired sensation, Decreased knowledge of use of DME, Decreased strength, Increased edema, Impaired vision/preception, Decreased cognition  Visit Diagnosis: Spastic hemiplegia affecting right dominant side (HCC)  Other abnormalities of gait and mobility  Abnormal posture     Problem List Patient Active Problem List   Diagnosis Date Noted  . Chronic  kidney disease (CKD), stage IV (severe) ([place]) 07/06/2015  . Acute CVA (cerebrovascular accident) ([employer])   . Acute encephalopathy 07/04/2015  . Sinus bradycardia 07/04/2015  . Type 2 diabetes mellitus with vascular disease ([place]) 07/04/2015  . UTI (lower urinary tract infection) 07/04/2015  . Parietal lobe infarction ([place]) 07/04/2015  . Spastic hemiplegia affecting nondominant side ([place]) 07/01/2015  . Obesity, morbid ([place]) 07/01/2015  . Absolute anemia   . Thrombocytopenia ([place])   . Acute ischemic VBA thalamic stroke ([place]) 01/18/2015  . AKI (acute kidney injury) ([place])   . Dysarthria   . Lethargy   . DM type 2 with diabetic peripheral neuropathy ([place])   . Labile blood pressure   . Hyperlipidemia   . History of CVA with residual deficit   . Chronic obstructive pulmonary disease ([place])   . Hemiparesis, aphasia, and dysphagia as late effect of cerebrovascular accident (CVA) ([place])   . Acute ischemic stroke ([place])   . CVA (cerebral vascular accident) ([place]) 01/14/2015  . CVA (cerebral infarction) 01/14/2015  . Right hemiplegia ([place])   . Right sided  weakness 01/13/2015  . Abdominal pain 06/20/2014  . Altered mental status 02/08/2012  . Sleep apnea 12/08/2010  . Morbid obesity ([place]) 12/08/2010  . CHF (congestive heart failure) ([place]) 12/08/2010  . Chronic renal failure 12/08/2010  . TIA (transient ischemic attack) 12/08/2010  . CAD (coronary artery disease) 12/08/2010  . Cellulitis of pubic region 12/01/2010  . Uncontrolled type 2 DM with hyperosmolar nonketotic hyperglycemia ([place]) 12/01/2010   Billie Ruddy, PT, DPT [facility] [address] [place], [place], [address] Phone: [phone]   Fax:  [phone] 07/26/2015, 10:47 AM  Name: HADASSA LOFTON MRN: [id number] Date of Birth: Dec 13, 1950

## 2015-07-26 NOTE — Therapy (Signed)
[facility] [address] [place], [place], [address] Phone: [phone]   Fax:  [phone]  Occupational Therapy Treatment  Patient Details  Name: Michelle Holmes MRN: [id number] Date of Birth: Oct 16, 1950 Referring Provider: Dr. Alysia Penna  Encounter Date: 07/26/2015      OT End of Session - 07/26/15 1129    Visit Number 13   Number of Visits 18   Date for OT Re-Evaluation 09/05/15   Authorization Type UHC Medicare (no visit limit, no auth, G-code needed); medicaid 2nd   Authorization Time Period cert. 0/7/68-0/8/81, re-cert.  01/28/13-9/45/85; re-cert. 07/22/15-09/20/15 due to hospitalizations   Authorization - Visit Number 13   Authorization - Number of Visits 20   OT Start Time 9292   OT Stop Time 1100   OT Time Calculation (min) 45 min   Activity Tolerance Patient tolerated treatment well   Behavior During Therapy WFL for tasks assessed/performed      Past Medical History  Diagnosis Date  . Emphysema   . Colon cancer ([place])   . Hypertension   . Hypercholesteremia   . Diabetes mellitus   . COPD (chronic obstructive pulmonary disease) ([place])   . Coronary artery disease   . Obesity   . Sleep apnea   . TIA (transient ischemic attack)   . CHF (congestive heart failure) ([place])   . Anemia   . DJD (degenerative joint disease)   . Renal insufficiency   . Chronic back pain   . Scoliosis   . Obesity hypoventilation syndrome ([place])   . Stroke ([place])   . Myocardial infarction [facility])     Past Surgical History  Procedure Laterality Date  . Coronary angioplasty with stent placement    . Colon surgery      There were no vitals filed for this visit.      Subjective Assessment - 07/26/15 1022    Subjective  Patient very eager to walk   Pertinent History CVA 01/13/16, hx of previous CVA with risidual R-sided weakness, hospitalization 05/05/15 with possible TIA or seizure disorder; also hx of HTN, MI, CHF, CAD, DM,  COPD, emphysema, sleep apnea, chronic back pain, scoliosis, DJD, renal insufficiency, anemia, and colon cancer.   Limitations cognitive deficits, inconsistent participation   Patient Stated Goals get back to "all that I did before"; when questioned pt stated that she wanted to work on toileting transfer ( but inconsistent responses)   Currently in Pain? No/denies   Pain Score 0-No pain                      OT Treatments/Exercises (OP) - 07/26/15 1108    ADLs   Functional Mobility Facilitated sit to stand and stand to sit with mod assist initially for LE activation, and forward weight shift in midline.  Patient initially fearful, but with repetition able to transition sit to/from stand with assist only to block right knee form buckling.  Patient in standing able to accept weight onto right LE.  Initially resistant and fearful, but with encouragement able to weight shift right.  In standing able to flex extend shoulder through 5-10 degrees actively.  Worked on controlled descent for stand to sit - patient fatigues after 3 repetitions. Addressed squat pivot transfer to stronger left side using components of sit to stand.  Patient able to transfer with min assist completing several small squats toward left.       ADL Comments Patient did not wear bra again today,  so did not practice UB dressing.     Neurological Re-education Exercises   Other Exercises 1 Worked on pre reach patterns in right UE with emphasis on keeping body stable to allow arm to be mobile.  Patient with report of shoulder pain beyond 60 degrees of shoulder flexion in sitting.  With further assessment, patient tending to pull head and  body toward left leading to shoulder discomfort.  When this corrected - pain resolved.     Manual Therapy   Edema Management Patient with significant pocket of fluid over dorsum of hand near [facility] joint.  Patient does not recall any injury - area not red, warm, or tender to touch.  Patient  reports she is completing edema massage regularly - and digits without edema.  Educated patient on need to focus efforts on area of swelling.     Joint Mobilization Carpal mobilization.  Patient with reduced wrist extension due to subluxation in rightwrist.  WIth traction and gentle mobilization able to seat carpal bones for increased wrist flexion / extension.  Patient without report of pain in wrist.                  OT Education - 07/26/15 1128    Education provided Yes   Education Details edema massage - specific area of hand   Person(s) Educated Patient   Methods Explanation;Demonstration;Tactile cues   Comprehension Verbalized understanding;Returned demonstration             OT Long Term Goals - 07/09/15 1558    OT LONG TERM GOAL #1   Title Pt will perform self PROM HEP with mod prompts/cueing.--due 08/07/15   Status On-going  07/09/15 partially met   OT LONG TERM GOAL #2   Title Pt will participate in functional activities for at least 33mn with mod prompts.   Status Achieved   OT LONG TERM GOAL #3   Title Pt will tolerate/allow gentle PROM to RUE at least 75% of the time.   Status Achieved   OT LONG TERM GOAL #4   Title Pt will perform transfer [facility].   Status Achieved   OT LONG TERM GOAL #5   Title Patient will don / doff pull over shirt with no greater than mod assist ( due 08/07/15)   Time 4   Period Weeks   Status New   OT LONG TERM GOAL #6   Title Patient will complete toilet transfer with min assist  (due 08/07/15)   Time 4   Period Weeks   Status New               Plan - 07/26/15 1132    Clinical Impression Statement Patient with increased tension in right upper extremity with most recent CVA, although functionally is close to prior level.  Patient with significant cognitive deficits, and limited carryover to home setting as casregivers unable to attend sessions with her.     Rehab Potential Fair   Clinical Impairments  Affecting Rehab Potential no caregiver support (during rehab affecting carryover)   OT Frequency 2x / week   OT Treatment/Interventions Self-care/ADL training;Therapeutic exercise;Functional Mobility Training;Patient/family education;Balance training;Splinting;Manual Therapy;Neuromuscular education;Ultrasound;Energy conservation;Manual lymph drainage;Therapeutic exercises;Therapeutic activities;DME and/or AE instruction;Parrafin;Cryotherapy;Electrical Stimulation;Fluidtherapy;Cognitive remediation/compensation;Visual/perceptual remediation/compensation;Passive range of motion;Contrast Bath;Moist Heat   Plan UB dressing, neuro re ed, functional mobility   OT Home Exercise Plan Education provided:  edema massage, splint wear/care; HEP 07/22/15   Consulted and Agree with Plan of Care Patient  Patient will benefit from skilled therapeutic intervention in order to improve the following deficits and impairments:  Decreased activity tolerance, Decreased balance, Decreased cognition, Decreased coordination, Decreased endurance, Decreased range of motion, Decreased mobility, Decreased knowledge of use of DME, Decreased knowledge of precautions, Decreased strength, Difficulty walking, Increased edema, Impaired perceived functional ability, Impaired flexibility, Obesity, Impaired UE functional use, Impaired tone, Pain, Impaired vision/preception, Improper body mechanics  Visit Diagnosis: Spastic hemiplegia affecting right dominant side (HCC)  Other disturbances of skin sensation  Abnormal posture  Muscle weakness (generalized)  Other lack of coordination  Visuospatial deficit  Pain In Right Arm  Other symptoms and signs involving cognitive functions following cerebral infarction    Problem List Patient Active Problem List   Diagnosis Date Noted  . Chronic kidney disease (CKD), stage IV (severe) ([place]) 07/06/2015  . Acute CVA (cerebrovascular accident) ([place])   . Acute encephalopathy  07/04/2015  . Sinus bradycardia 07/04/2015  . Type 2 diabetes mellitus with vascular disease ([place]) 07/04/2015  . UTI (lower urinary tract infection) 07/04/2015  . Parietal lobe infarction ([place]) 07/04/2015  . Spastic hemiplegia affecting nondominant side ([place]) 07/01/2015  . Obesity, morbid ([place]) 07/01/2015  . Absolute anemia   . Thrombocytopenia ([place])   . Acute ischemic VBA thalamic stroke ([place]) 01/18/2015  . AKI (acute kidney injury) ([place])   . Dysarthria   . Lethargy   . DM type 2 with diabetic peripheral neuropathy ([place])   . Labile blood pressure   . Hyperlipidemia   . History of CVA with residual deficit   . Chronic obstructive pulmonary disease ([place])   . Hemiparesis, aphasia, and dysphagia as late effect of cerebrovascular accident (CVA) ([place])   . Acute ischemic stroke ([place])   . CVA (cerebral vascular accident) ([place]) 01/14/2015  . CVA (cerebral infarction) 01/14/2015  . Right hemiplegia ([place])   . Right sided weakness 01/13/2015  . Abdominal pain 06/20/2014  . Altered mental status 02/08/2012  . Sleep apnea 12/08/2010  . Morbid obesity ([place]) 12/08/2010  . CHF (congestive heart failure) ([place]) 12/08/2010  . Chronic renal failure 12/08/2010  . TIA (transient ischemic attack) 12/08/2010  . CAD (coronary artery disease) 12/08/2010  . Cellulitis of pubic region 12/01/2010  . Uncontrolled type 2 DM with hyperosmolar nonketotic hyperglycemia ([place]) 12/01/2010    Mariah Milling, OTR/L 07/26/2015, 11:36 AM  [facility] [address] [place], [place], [address] Phone: [phone]   Fax:  [phone]  Name: Michelle Holmes MRN: [id number] Date of Birth: Feb 06, 1950

## 2015-08-02 ENCOUNTER — Ambulatory Visit: Payer: 59 | Attending: Physical Medicine & Rehabilitation | Admitting: Physical Therapy

## 2015-08-02 VITALS — BP 159/89 | HR 70

## 2015-08-02 DIAGNOSIS — R293 Abnormal posture: Secondary | ICD-10-CM | POA: Diagnosis not present

## 2015-08-02 DIAGNOSIS — R208 Other disturbances of skin sensation: Secondary | ICD-10-CM | POA: Diagnosis not present

## 2015-08-02 DIAGNOSIS — G8111 Spastic hemiplegia affecting right dominant side: Secondary | ICD-10-CM | POA: Insufficient documentation

## 2015-08-02 DIAGNOSIS — R278 Other lack of coordination: Secondary | ICD-10-CM | POA: Insufficient documentation

## 2015-08-02 DIAGNOSIS — I69318 Other symptoms and signs involving cognitive functions following cerebral infarction: Secondary | ICD-10-CM | POA: Diagnosis not present

## 2015-08-02 DIAGNOSIS — M6281 Muscle weakness (generalized): Secondary | ICD-10-CM | POA: Insufficient documentation

## 2015-08-02 DIAGNOSIS — R41842 Visuospatial deficit: Secondary | ICD-10-CM | POA: Insufficient documentation

## 2015-08-02 DIAGNOSIS — M79601 Pain in right arm: Secondary | ICD-10-CM | POA: Diagnosis not present

## 2015-08-02 DIAGNOSIS — R2689 Other abnormalities of gait and mobility: Secondary | ICD-10-CM | POA: Diagnosis not present

## 2015-08-04 NOTE — Therapy (Signed)
[facility] [address] [place], [place], [address] Phone: [phone]   Fax:  [phone]  Physical Therapy Treatment  Patient Details  Name: Michelle Holmes MRN: [id number] Date of Birth: 31-Dec-1950 Referring Provider: Alysia Penna, MD  Encounter Date: 08/02/2015      PT End of Session - 08/04/15 1412    Visit Number 19   Number of Visits 25   Date for PT Re-Evaluation 09/20/15   Authorization Type UHC MCR - G Codes and PN required every 10 visits   PT Start Time 0930   PT Stop Time 1014   PT Time Calculation (min) 44 min   Equipment Utilized During Treatment Gait belt   Activity Tolerance Patient limited by fatigue  Mutliple seated rest breaks required.   Behavior During Therapy [facility] for tasks assessed/performed      Past Medical History  Diagnosis Date  . Emphysema   . Colon cancer ([place])   . Hypertension   . Hypercholesteremia   . Diabetes mellitus   . COPD (chronic obstructive pulmonary disease) ([place])   . Coronary artery disease   . Obesity   . Sleep apnea   . TIA (transient ischemic attack)   . CHF (congestive heart failure) ([place])   . Anemia   . DJD (degenerative joint disease)   . Renal insufficiency   . Chronic back pain   . Scoliosis   . Obesity hypoventilation syndrome ([place])   . Stroke ([place])   . Myocardial infarction [facility])     Past Surgical History  Procedure Laterality Date  . Coronary angioplasty with stent placement    . Colon surgery      Filed Vitals:   08/02/15 0933 08/02/15 0950 08/02/15 0955  BP: 158/80 165/96 159/89  Pulse:  68 70        Subjective Assessment - 08/04/15 1359    Subjective "I wanna walk. I need a rope at home like that (gait belt) so I can walk."   Pertinent History PMH significant for: CVA x4, HTN, HLD, CHF, COPD, OSA, CAD, DM II, colon cancer, DJD, scoliosis, chronic renal insufficiency   Patient Stated Goals "I want a new wheelchair and I want to  walk."   Currently in Pain? No/denies                         [facility] Adult PT Treatment/Exercise - 08/04/15 0001    Transfers   Transfers Sit to Stand;Stand to Sit;Lateral/Scoot Transfers   Sit to Stand 3: Mod assist   Sit to Stand Details (indicate cue type and reason) Blocked practice from EOM with cueing for hand placement with ineffective wiithin-session carryover. Cueing for full anterior weight shift.   Stand to Sit 4: Min assist   Stand to Sit Details Cueing for anterior weight shift, controlled descent; tactile cueing at R knee for WB/proprioception.   Lateral/Scoot Transfers 3: Mod assist;4: Education officer, environmental Details (indicate cue type and reason) from w/c <> mat table x2 trials to R side, x2 trials to L side. Initial 2 trials with PT seated in standard chair in front of pt, pt with LUE support of arm of standard chair to facilitate full anterior weight shift. When using this technique, pt requires only min A for lateral scooting transfer in B directions; however, upon transitioning to transfer without use of LUE on chair, pt requires mod A for transfer, max multimodal cueing for full anterior  weight shift.   Ambulation/Gait   Pre-Gait Activities Standing with BUE support at R PFRW, pt performed M/L weight shifting for pre-gait; cueing for full lateral weight shift on RLE. Pre-gait limited to < 1 minute due to pt fatigue. Transitioned from weight shifting with R PFRW to no UE support with PT positioned under RUE (to enable PT to control R knee), performing RLE advancement. Attempted x2 trials, but pt unable to recover balance after RLE advancement due RLE placement (suspect due to spasticity) in excessive R hip ADD/ER.    Self-Care   Self-Care Other Self-Care Comments   Other Self-Care Comments  Pt inquired multiple times this session, "When are we going to work on walking?" Trialed pre-gait activities (see above) to demonstrate to pt that ambulation is  currently limited by significant RLE tone, limited activity tolerance, and pt unwillingness to trial R PFRW. Educated pt that use of standing frame will help to manage RLE tone and improve transfers, which is the primary focus of PT at this time.  Education provided during seated rest breaks   Neuro Re-ed    Neuro Re-ed Details  Sit <> partial stand initiated by LUE anterior reaching x10 reps; progressed to LUE reaching to initiate scooting to L x10 reps. Tactile cueing at R knee for proprioceptive input, to control RLE position on floor (due to tone causing R hip ADD/ER.                  PT Short Term Goals - 07/22/15 1918    PT SHORT TERM GOAL #1   Title STG's = LTG's           PT Long Term Goals - 08/04/15 1420    PT LONG TERM GOAL #1   Title Pt will consistently perform sit <> stand from mat table with min A using LRAD.  (Target date: 08/19/15)   Status On-going   PT LONG TERM GOAL #2   Title Pt will consistently transfer from w/c <> mat table with mod A using LRAD to indicate increased safety with functional transfers.  (08/19/15)   Status On-going   PT LONG TERM GOAL #3   Title Pt will tolerate standing with total A of standing frame for > /= 10 minutes to indicate increased standing tolerance.  (08/19/15)   Baseline 6/20: REVISED from 5 to 10 minutes due to pt progress   Status Revised   PT LONG TERM GOAL #4   Title Will trial varying types of carbon fiber AFO's during PT sessions to determine if pt appropriate candidate for off-the-shelf AFO.  (08/19/15)   Baseline 7/7: DEFERRED due to pt inability to tolerate wearing R carbon fiber GRAFO's.   Status Deferred               Plan - 08/04/15 1414    Clinical Impression Statement Session focused on increasing pt independence with lateral scooting transfers, static/dynamic standing, and attempting pre-gait. Pt inquired about attempting to walk multiple times during session, but not amenable to use of R PFRW.  Attempted pre-gait, which was limited by decreased activity tolerance and excessive RLE ADD/ER during RLE advancement (suspect this is due to RLE spasticity). Moreover, BP increased  from 158/80 to 168/96 after pre-gait activities. BP did decrease to 159/89 after seated rest break x5 minutes. Therefore, recommend remaining sessions focus on transfers and standing tolerance.   Rehab Potential Good   Clinical Impairments Affecting Rehab Potential cognitive impairments; limited family support; relies on friend for transportation  PT Frequency 2x / week   PT Duration 4 weeks   PT Treatment/Interventions ADLs/Self Care Home Management;DME Instruction;Vestibular;Gait training;Neuromuscular re-education;Stair training;Functional mobility training;Therapeutic activities;Patient/family education;Therapeutic exercise;Balance training;Prosthetic Training;Orthotic Fit/Training;Wheelchair mobility training   PT Next Visit [place] and PN. Continue to address transfers and standing w/ use of standing frame (check BP) for tone management and activity tolerance.    Consulted and Agree with Plan of Care Patient      Patient will benefit from skilled therapeutic intervention in order to improve the following deficits and impairments:  Postural dysfunction, Impaired tone, Abnormal gait, Decreased balance, Decreased activity tolerance, Decreased endurance, Decreased coordination, Decreased mobility, Impaired perceived functional ability, Impaired sensation, Decreased knowledge of use of DME, Decreased strength, Increased edema, Impaired vision/preception, Decreased cognition  Visit Diagnosis: Spastic hemiplegia affecting right dominant side (HCC)  Other abnormalities of gait and mobility     Problem List Patient Active Problem List   Diagnosis Date Noted  . Chronic kidney disease (CKD), stage IV (severe) ([place]) 07/06/2015  . Acute CVA (cerebrovascular accident) ([place])   . Acute encephalopathy 07/04/2015  .  Sinus bradycardia 07/04/2015  . Type 2 diabetes mellitus with vascular disease ([place]) 07/04/2015  . UTI (lower urinary tract infection) 07/04/2015  . Parietal lobe infarction ([place]) 07/04/2015  . Spastic hemiplegia affecting nondominant side ([place]) 07/01/2015  . Obesity, morbid ([place]) 07/01/2015  . Absolute anemia   . Thrombocytopenia ([place])   . Acute ischemic VBA thalamic stroke ([place]) 01/18/2015  . AKI (acute kidney injury) ([place])   . Dysarthria   . Lethargy   . DM type 2 with diabetic peripheral neuropathy ([place])   . Labile blood pressure   . Hyperlipidemia   . History of CVA with residual deficit   . Chronic obstructive pulmonary disease ([place])   . Hemiparesis, aphasia, and dysphagia as late effect of cerebrovascular accident (CVA) ([place])   . Acute ischemic stroke ([place])   . CVA (cerebral vascular accident) ([place]) 01/14/2015  . CVA (cerebral infarction) 01/14/2015  . Right hemiplegia ([place])   . Right sided weakness 01/13/2015  . Abdominal pain 06/20/2014  . Altered mental status 02/08/2012  . Sleep apnea 12/08/2010  . Morbid obesity ([place]) 12/08/2010  . CHF (congestive heart failure) ([place]) 12/08/2010  . Chronic renal failure 12/08/2010  . TIA (transient ischemic attack) 12/08/2010  . CAD (coronary artery disease) 12/08/2010  . Cellulitis of pubic region 12/01/2010  . Uncontrolled type 2 DM with hyperosmolar nonketotic hyperglycemia ([place]) 12/01/2010   Billie Ruddy, PT, DPT [facility] [address] [place], [place], [address] Phone: [phone]   Fax:  [phone] 08/04/2015, 2:23 PM  Name: Michelle Holmes MRN: [id number] Date of Birth: Jul 05, 1950

## 2015-08-06 ENCOUNTER — Ambulatory Visit: Payer: 59 | Admitting: Occupational Therapy

## 2015-08-06 ENCOUNTER — Encounter: Payer: Self-pay | Admitting: Physical Therapy

## 2015-08-06 ENCOUNTER — Ambulatory Visit: Payer: 59 | Admitting: Physical Therapy

## 2015-08-06 ENCOUNTER — Encounter: Payer: Self-pay | Admitting: Occupational Therapy

## 2015-08-06 VITALS — BP 172/88 | HR 62

## 2015-08-06 DIAGNOSIS — I69318 Other symptoms and signs involving cognitive functions following cerebral infarction: Secondary | ICD-10-CM

## 2015-08-06 DIAGNOSIS — R2689 Other abnormalities of gait and mobility: Secondary | ICD-10-CM

## 2015-08-06 DIAGNOSIS — M6281 Muscle weakness (generalized): Secondary | ICD-10-CM

## 2015-08-06 DIAGNOSIS — R293 Abnormal posture: Secondary | ICD-10-CM

## 2015-08-06 DIAGNOSIS — R208 Other disturbances of skin sensation: Secondary | ICD-10-CM

## 2015-08-06 DIAGNOSIS — G8111 Spastic hemiplegia affecting right dominant side: Secondary | ICD-10-CM | POA: Diagnosis not present

## 2015-08-06 DIAGNOSIS — R278 Other lack of coordination: Secondary | ICD-10-CM

## 2015-08-06 DIAGNOSIS — R41842 Visuospatial deficit: Secondary | ICD-10-CM

## 2015-08-06 DIAGNOSIS — M79601 Pain in right arm: Secondary | ICD-10-CM

## 2015-08-06 NOTE — Therapy (Signed)
[facility] [address] [place], [place], [address] Phone: [phone]   Fax:  [phone]  Occupational Therapy Treatment  Patient Details  Name: Michelle Holmes MRN: [id number] Date of Birth: Jul 18, 1950 Referring Provider: Dr. Alysia Penna  Encounter Date: 08/06/2015      OT End of Session - 08/06/15 2331    Visit Number 14   Number of Visits 18   Date for OT Re-Evaluation 09/05/15   Authorization Type UHC Medicare (no visit limit, no auth, G-code needed); medicaid 2nd   Authorization Time Period cert. 123456, re-cert.  [id number]; re-cert. 07/22/15-09/20/15 due to hospitalizations   Authorization - Visit Number 14   OT Start Time [id number]   OT Stop Time 1530   OT Time Calculation (min) 43 min   Activity Tolerance Patient tolerated treatment well      Past Medical History  Diagnosis Date  . Emphysema   . Colon cancer ([place])   . Hypertension   . Hypercholesteremia   . Diabetes mellitus   . COPD (chronic obstructive pulmonary disease) ([place])   . Coronary artery disease   . Obesity   . Sleep apnea   . TIA (transient ischemic attack)   . CHF (congestive heart failure) ([place])   . Anemia   . DJD (degenerative joint disease)   . Renal insufficiency   . Chronic back pain   . Scoliosis   . Obesity hypoventilation syndrome ([place])   . Stroke ([place])   . Myocardial infarction [facility])     Past Surgical History  Procedure Laterality Date  . Coronary angioplasty with stent placement    . Colon surgery      There were no vitals filed for this visit.      Subjective Assessment - 08/06/15 2325    Subjective  Patient very eager to walk   Pertinent History CVA 01/13/16, hx of previous CVA with risidual R-sided weakness, hospitalization 05/05/15 with possible TIA or seizure disorder; also hx of HTN, MI, CHF, CAD, DM, COPD, emphysema, sleep apnea, chronic back pain, scoliosis, DJD, renal insufficiency, anemia, and  colon cancer.   Limitations cognitive deficits, inconsistent participation   Patient Stated Goals get back to "all that I did before"; when questioned pt stated that she wanted to work on toileting transfer ( but inconsistent responses)   Currently in Pain? No/denies   Pain Score 0-No pain                      OT Treatments/Exercises (OP) - 08/06/15 0001    ADLs   UB Dressing Patient required moderate assistance to doff and don pull over type shirt today while seated in wheelchair.  This is how patient indicates that she completes this task at home.  Patient needed max encouragement initially, and then step by step instruction for this task.  Patient indicated that she does not complete any aspect of dressing herself - but "need to know how to do it."  Patient breathing heavilly with this activity.  Patient easily frustrated yet persisted with intermittent coaching and praise.  Patient with significant increase in tension in right arm, creating an even more challenging task.     Manual Therapy   Manual Therapy Joint mobilization;Edema management   Joint Mobilization Carpal mobilization.  Patient with reduced wrist extension due to subluxation in rightwrist.  WIth traction and gentle mobilization able to seat carpal bones for increased wrist flexion / extension.  Patient without report of pain  in wrist.                  OT Education - 08/06/15 2331    Education provided Yes   Education Details dressing techniques for upper body   Person(s) Educated Patient   Methods Explanation;Demonstration   Comprehension Need further instruction             OT Long Term Goals - 08/06/15 2334    OT LONG TERM GOAL #5   Title Patient will don / doff pull over shirt with no greater than mod assist ( due 08/07/15)   Status Achieved   OT LONG TERM GOAL #6   Title Patient will complete toilet transfer with min assist  (due 08/07/15)   Status On-going               Plan -  08/06/15 2331    Clinical Impression Statement Patient with limited progression toward OT goals - with significant spasticity / muscle tension after most recent stroke.  Carryover is limited as family does not attend therapy sessions,and patient with significant limitations in insight and awarenss,     Rehab Potential Fair   Clinical Impairments Affecting Rehab Potential no caregiver support (during rehab affecting carryover)   OT Frequency 2x / week   OT Treatment/Interventions Self-care/ADL training;Therapeutic exercise;Functional Mobility Training;Patient/family education;Balance training;Splinting;Manual Therapy;Neuromuscular education;Ultrasound;Energy conservation;Manual lymph drainage;Therapeutic exercises;Therapeutic activities;DME and/or AE instruction;Parrafin;Cryotherapy;Electrical Stimulation;Fluidtherapy;Cognitive remediation/compensation;Visual/perceptual remediation/compensation;Passive range of motion;Contrast Bath;Moist Heat   Plan Functional mobility, ADL, NMR RUE   OT Home Exercise Plan Education provided:  edema massage, splint wear/care; HEP 07/22/15   Consulted and Agree with Plan of Care Patient      Patient will benefit from skilled therapeutic intervention in order to improve the following deficits and impairments:     Visit Diagnosis: Spastic hemiplegia affecting right dominant side (HCC)  Abnormal posture  Other disturbances of skin sensation  Muscle weakness (generalized)  Other lack of coordination  Visuospatial deficit  Pain In Right Arm  Other symptoms and signs involving cognitive functions following cerebral infarction    Problem List Patient Active Problem List   Diagnosis Date Noted  . Chronic kidney disease (CKD), stage IV (severe) ([place]) 07/06/2015  . Acute CVA (cerebrovascular accident) ([place])   . Acute encephalopathy 07/04/2015  . Sinus bradycardia 07/04/2015  . Type 2 diabetes mellitus with vascular disease ([place]) 07/04/2015  . UTI (lower  urinary tract infection) 07/04/2015  . Parietal lobe infarction ([place]) 07/04/2015  . Spastic hemiplegia affecting nondominant side ([place]) 07/01/2015  . Obesity, morbid ([place]) 07/01/2015  . Absolute anemia   . Thrombocytopenia ([place])   . Acute ischemic VBA thalamic stroke ([place]) 01/18/2015  . AKI (acute kidney injury) ([place])   . Dysarthria   . Lethargy   . DM type 2 with diabetic peripheral neuropathy ([place])   . Labile blood pressure   . Hyperlipidemia   . History of CVA with residual deficit   . Chronic obstructive pulmonary disease ([place])   . Hemiparesis, aphasia, and dysphagia as late effect of cerebrovascular accident (CVA) ([place])   . Acute ischemic stroke ([place])   . CVA (cerebral vascular accident) ([place]) 01/14/2015  . CVA (cerebral infarction) 01/14/2015  . Right hemiplegia ([place])   . Right sided weakness 01/13/2015  . Abdominal pain 06/20/2014  . Altered mental status 02/08/2012  . Sleep apnea 12/08/2010  . Morbid obesity ([place]) 12/08/2010  . CHF (congestive heart failure) ([place]) 12/08/2010  . Chronic renal failure 12/08/2010  . TIA (transient ischemic  attack) 12/08/2010  . CAD (coronary artery disease) 12/08/2010  . Cellulitis of pubic region 12/01/2010  . Uncontrolled type 2 DM with hyperosmolar nonketotic hyperglycemia ([place]) 12/01/2010    Mariah Milling,  OTR/L 08/06/2015, 11:36 PM  [facility] [address] [place], [place], [address] Phone: [phone]   Fax:  [phone]  Name: ISHI LEDDEN MRN: [id number] Date of Birth: 29-Sep-1950

## 2015-08-07 ENCOUNTER — Other Ambulatory Visit: Payer: Self-pay

## 2015-08-07 NOTE — Therapy (Signed)
[facility] [address] [place], [place], [address] Phone: [phone]   Fax:  [phone]  Physical Therapy Treatment  Patient Details  Name: Michelle Holmes MRN: [id number] Date of Birth: 03-Jul-1950 Referring Provider: Alysia Penna, MD  Encounter Date: 08/06/2015      PT End of Session - 08/06/15 1601    Visit Number 20   Number of Visits 25   Date for PT Re-Evaluation 09/20/15   Authorization Type UHC MCR - G Codes and PN required every 10 visits   PT Start Time [id number]   PT Stop Time 1445   PT Time Calculation (min) 40 min   Equipment Utilized During Treatment Gait belt;Right knee immobilizer   Activity Tolerance Patient limited by fatigue  Mutliple seated rest breaks required.   Behavior During Therapy [facility] for tasks assessed/performed      Past Medical History  Diagnosis Date  . Emphysema   . Colon cancer ([place])   . Hypertension   . Hypercholesteremia   . Diabetes mellitus   . COPD (chronic obstructive pulmonary disease) ([place])   . Coronary artery disease   . Obesity   . Sleep apnea   . TIA (transient ischemic attack)   . CHF (congestive heart failure) ([place])   . Anemia   . DJD (degenerative joint disease)   . Renal insufficiency   . Chronic back pain   . Scoliosis   . Obesity hypoventilation syndrome ([place])   . Stroke ([place])   . Myocardial infarction [facility])     Past Surgical History  Procedure Laterality Date  . Coronary angioplasty with stent placement    . Colon surgery      Filed Vitals:   08/06/15 1412 08/07/15 1603 08/07/15 1604  BP: 162/88 184/98 172/88  Pulse: 62             [facility] Adult PT Treatment/Exercise - 08/07/15 0001    Transfers   Transfers Sit to Stand;Stand to Sit;Lateral/Scoot Transfers   Sit to Stand 3: Mod assist;4: Min assist;With upper extremity assist;With armrests;From chair/3-in-1;From bed   Sit to Stand Details Tactile cues for sequencing;Tactile cues for  weight shifting;Tactile cues for placement;Verbal cues for sequencing;Verbal cues for technique;Verbal cues for safe use of DME/AE;Manual facilitation for weight shifting   Sit to Stand Details (indicate cue type and reason) performed from wheelchair x 1 and mat x 3 reps with decreased assist needed as reps progressed   Stand to Sit 4: Min assist;3: Mod assist;With upper extremity assist;With armrests;To bed;To chair/3-in-1;Uncontrolled descent   Stand to Sit Details (indicate cue type and reason) Tactile cues for weight shifting;Tactile cues for sequencing;Tactile cues for placement;Verbal cues for technique;Verbal cues for precautions/safety;Manual facilitation for weight shifting   Stand to Sit Details cues to reach back with left UE to assist with sitting down. cues to use legs and arm to control descent with sitting down.   Lateral/Scoot Transfers 3: Mod assist;2: Max Art gallery manager Details (indicate cue type and reason) lateral scooting along mat table   Ambulation/Gait   Ambulation/Gait Yes   Ambulation/Gait Assistance 4: Min assist  of 2 people   Ambulation/Gait Assistance Details using right PFRW and KI on right leg to assist with knee control: min assist of 2 people for balance. Pt able to assist with advancement on walker and bil LE's, however assist was needed for right step position/control due to spastic movement into excessive adduction. PTA was able to better control/assist with this  today using KI on right leg.                                    Ambulation Distance (Feet) 10 Feet   Assistive device Right platform walker   Gait Pattern Decreased hip/knee flexion - right;Poor foot clearance - right;Scissoring;Decreased weight shift to right;Decreased dorsiflexion - right;Decreased stride length;Decreased step length - left;Right flexed knee in stance   Ambulation Surface Level;Indoor   Gait Comments Pt fatigued quickly today, requesting to sit down after ~10 feet.     Neuro Re-ed    Neuro Re-ed Details  sit<>stand performed 3 times using right PFRW: in standing worked on lateral weight shifting, anterior/posterior weight shifting, left UE reaching up/out varied directions, and left leg stepping fwd/bwd and laterally. min to mod assist needed for balance with cues/facilitation for posture/ex form and technique. manual blocking of right knee provided for safety to prevent buckling.                                                                                                 PT Short Term Goals - 07/22/15 1918    PT SHORT TERM GOAL #1   Title STG's = LTG's           PT Long Term Goals - 08/04/15 1420    PT LONG TERM GOAL #1   Title Pt will consistently perform sit <> stand from mat table with min A using LRAD.  (Target date: 08/19/15)   Status On-going   PT LONG TERM GOAL #2   Title Pt will consistently transfer from w/c <> mat table with mod A using LRAD to indicate increased safety with functional transfers.  (08/19/15)   Status On-going   PT LONG TERM GOAL #3   Title Pt will tolerate standing with total A of standing frame for > /= 10 minutes to indicate increased standing tolerance.  (08/19/15)   Baseline 6/20: REVISED from 5 to 10 minutes due to pt progress   Status Revised   PT LONG TERM GOAL #4   Title Will trial varying types of carbon fiber AFO's during PT sessions to determine if pt appropriate candidate for off-the-shelf AFO.  (08/19/15)   Baseline 7/7: DEFERRED due to pt inability to tolerate wearing R carbon fiber GRAFO's.   Status Deferred               Plan - 08/06/15 1617    Clinical Impression Statement today's session continued to focus on transfers, standing balance and gait. Pt continues to fatigue quickly with increased activity. Pt's BP did elevate with activity today and decreased with rest. Pt is making slow progress toward goals.    Rehab Potential Good   Clinical Impairments Affecting Rehab Potential cognitive  impairments; limited family support; relies on friend for transportation   PT Frequency 2x / week   PT Duration 4 weeks   PT Treatment/Interventions ADLs/Self Care Home Management;DME Instruction;Vestibular;Gait training;Neuromuscular re-education;Stair training;Functional mobility training;Therapeutic activities;Patient/family education;Therapeutic exercise;Balance training;Prosthetic Training;Orthotic Fit/Training;Wheelchair mobility training  PT Next Visit Plan . Continue to address transfers and standing w/ use of standing frame (check BP) for tone management and activity tolerance. continue with gait as able/medically stable to do so.   Consulted and Agree with Plan of Care Patient           Patient will benefit from skilled therapeutic intervention in order to improve the following deficits and impairments:  Postural dysfunction, Impaired tone, Abnormal gait, Decreased balance, Decreased activity tolerance, Decreased endurance, Decreased coordination, Decreased mobility, Impaired perceived functional ability, Impaired sensation, Decreased knowledge of use of DME, Decreased strength, Increased edema, Impaired vision/preception, Decreased cognition  Visit Diagnosis: Other abnormalities of gait and mobility  Abnormal posture  Spastic hemiplegia affecting right dominant side (HCC)  Other disturbances of skin sensation       G-Codes - August 27, 2015 1618    Functional Assessment Tool Used requires min to mod assist for sit<>stand, mod assist for stand<>pivot transfers       Problem List Patient Active Problem List   Diagnosis Date Noted  . Chronic kidney disease (CKD), stage IV (severe) ([place]) 07/06/2015  . Acute CVA (cerebrovascular accident) ([place])   . Acute encephalopathy 07/04/2015  . Sinus bradycardia 07/04/2015  . Type 2 diabetes mellitus with vascular disease ([place]) 07/04/2015  . UTI (lower urinary tract infection) 07/04/2015  . Parietal lobe infarction ([place]) 07/04/2015  .  Spastic hemiplegia affecting nondominant side ([place]) 07/01/2015  . Obesity, morbid ([place]) 07/01/2015  . Absolute anemia   . Thrombocytopenia ([place])   . Acute ischemic VBA thalamic stroke ([place]) 01/18/2015  . AKI (acute kidney injury) ([place])   . Dysarthria   . Lethargy   . DM type 2 with diabetic peripheral neuropathy ([place])   . Labile blood pressure   . Hyperlipidemia   . History of CVA with residual deficit   . Chronic obstructive pulmonary disease ([place])   . Hemiparesis, aphasia, and dysphagia as late effect of cerebrovascular accident (CVA) ([place])   . Acute ischemic stroke ([place])   . CVA (cerebral vascular accident) ([place]) 01/14/2015  . CVA (cerebral infarction) 01/14/2015  . Right hemiplegia ([place])   . Right sided weakness 01/13/2015  . Abdominal pain 06/20/2014  . Altered mental status 02/08/2012  . Sleep apnea 12/08/2010  . Morbid obesity ([place]) 12/08/2010  . CHF (congestive heart failure) ([place]) 12/08/2010  . Chronic renal failure 12/08/2010  . TIA (transient ischemic attack) 12/08/2010  . CAD (coronary artery disease) 12/08/2010  . Cellulitis of pubic region 12/01/2010  . Uncontrolled type 2 DM with hyperosmolar nonketotic hyperglycemia ([place]) 12/01/2010    Willow Ora, PTA, [facility] [address] [place], [place] [address] [phone] 08-27-2015, 4:20 PM   Name: Michelle Holmes MRN: [id number] Date of Birth: 12/10/1950     Addendum by Billie Ruddy, PT, DPT Addendum: G Codes Functional Assessment Tool: requires mod A for stand pivot transfers Functional Limitation: Changing and Maintaining Body Position Current Status [phone]): CK Goal Status [phone]): CJ   Physical Therapy Progress Note  Dates of Reporting Period: 07/22/15 to 08/06/15  Objective Reports of Subjective Statement: Pt wishes to focus on standing and walking. Is very much looking forward to receiving new power w/c.  Objective Measurements: See above.  Goal Update: See  above.  Plan: Continue per POC.  Reason Skilled Services are Required: To maximize stability/independence with functional mobility, decrease spasticity, avoid contractures, and minimize fall risk.  Billie Ruddy, PT, [facility] [address]  [place], [place], [address] Phone: [phone]   Fax:  [phone] 08/08/2015, 12:53 PM

## 2015-08-07 NOTE — Patient Outreach (Signed)
[facility]) Care Management  08/07/2015  Michelle Holmes [birth year] [id number]   REFERRAL SOURCE:  EMMI stroke transition program REFERRAL REASON:  Follow up / EMMI stroke program  Telephone call to patient for EMM stroke follow up.  Unable to reach patient or leave voice mail message.  Message states patients mailbox is full.  PLAN; RNCM will attempt 2nd telephone call to patient within 2 weeks.  Quinn Plowman RN,BSN,CCM [facility] Telephonic  [phone]

## 2015-08-09 ENCOUNTER — Encounter: Payer: Self-pay | Admitting: Physical Therapy

## 2015-08-09 ENCOUNTER — Encounter: Payer: Self-pay | Admitting: Occupational Therapy

## 2015-08-09 ENCOUNTER — Ambulatory Visit: Payer: 59 | Admitting: Physical Therapy

## 2015-08-09 ENCOUNTER — Ambulatory Visit: Payer: 59 | Admitting: Occupational Therapy

## 2015-08-09 DIAGNOSIS — M6281 Muscle weakness (generalized): Secondary | ICD-10-CM

## 2015-08-09 DIAGNOSIS — M79601 Pain in right arm: Secondary | ICD-10-CM

## 2015-08-09 DIAGNOSIS — R293 Abnormal posture: Secondary | ICD-10-CM

## 2015-08-09 DIAGNOSIS — R41842 Visuospatial deficit: Secondary | ICD-10-CM

## 2015-08-09 DIAGNOSIS — R2689 Other abnormalities of gait and mobility: Secondary | ICD-10-CM | POA: Diagnosis not present

## 2015-08-09 DIAGNOSIS — R208 Other disturbances of skin sensation: Secondary | ICD-10-CM | POA: Diagnosis not present

## 2015-08-09 DIAGNOSIS — G8111 Spastic hemiplegia affecting right dominant side: Secondary | ICD-10-CM

## 2015-08-09 DIAGNOSIS — R278 Other lack of coordination: Secondary | ICD-10-CM | POA: Diagnosis not present

## 2015-08-09 DIAGNOSIS — I69318 Other symptoms and signs involving cognitive functions following cerebral infarction: Secondary | ICD-10-CM

## 2015-08-09 NOTE — Therapy (Signed)
[facility] [address] [place], [place], [address] Phone: [phone]   Fax:  [phone]  Occupational Therapy Treatment  Patient Details  Name: Michelle Holmes MRN: [id number] Date of Birth: 07/07/1950 Referring Provider: Dr. Alysia Penna  Encounter Date: 08/09/2015      OT End of Session - 08/09/15 1111    Visit Number 15   Number of Visits 18   Date for OT Re-Evaluation 09/05/15   Authorization Type UHC Medicare (no visit limit, no auth, G-code needed); medicaid 2nd   Authorization Time Period cert. 123456, re-cert.  [id number]; re-cert. 07/22/15-09/20/15 due to hospitalizations   Authorization - Visit Number 15   Authorization - Number of Visits 20   OT Start Time [id number]   OT Stop Time 1055   OT Time Calculation (min) 40 min   Activity Tolerance Patient tolerated treatment well   Behavior During Therapy WFL for tasks assessed/performed      Past Medical History  Diagnosis Date  . Emphysema   . Colon cancer ([place])   . Hypertension   . Hypercholesteremia   . Diabetes mellitus   . COPD (chronic obstructive pulmonary disease) ([place])   . Coronary artery disease   . Obesity   . Sleep apnea   . TIA (transient ischemic attack)   . CHF (congestive heart failure) ([place])   . Anemia   . DJD (degenerative joint disease)   . Renal insufficiency   . Chronic back pain   . Scoliosis   . Obesity hypoventilation syndrome ([place])   . Stroke ([place])   . Myocardial infarction [facility])     Past Surgical History  Procedure Laterality Date  . Coronary angioplasty with stent placement    . Colon surgery      There were no vitals filed for this visit.      Subjective Assessment - 08/09/15 1025    Subjective  Patient surprisedby dicussion regarding approaching discharge from OT services   Pertinent History CVA 01/13/16, hx of previous CVA with risidual R-sided weakness, hospitalization 05/05/15 with possible TIA or  seizure disorder; also hx of HTN, MI, CHF, CAD, DM, COPD, emphysema, sleep apnea, chronic back pain, scoliosis, DJD, renal insufficiency, anemia, and colon cancer.   Patient Stated Goals get back to "all that I did before"; when questioned pt stated that she wanted to work on toileting transfer ( but inconsistent responses)                      OT Treatments/Exercises (OP) - 08/09/15 1058    ADLs   ADL Comments Reviewed overall plan of care, with intent to discontinue OT in the near future.  Patient may benefit from medical management of spasticity in right UE/LE.  Will communicate with referring MD.     Splinting   Splinting Patient with significant increase in spasticity since most recent stroke, and resting hand spling now ill fitting, and difficult for patient to put on herself.  Patient reports that she is feeling discomfort in attempting to don splint now, whereas in the past she could don/doff without assistance.  Issued patient a prefabricated padded resting splint, and modified it to allow slight wrist flexion, and digit flexion.  Patient needed cueing, but able to don/doff splint during this session.     Manual Therapy   Joint Mobilization Patient with spasticity in wrist and finger flexors, With traction and gentle range toward neutral wrist, able to begin to extend fingers en  masse.  Completed wtrist and digit alignment prior to attempts to don resting hand splint.                  OT Education - 08/09/15 1110    Education provided Yes   Education Details OT upcoming discharge, splint don/doff technique   Person(s) Educated Patient   Methods Explanation;Demonstration   Comprehension Verbalized understanding;Verbal cues required;Need further instruction             OT Long Term Goals - 08/06/15 2334    OT LONG TERM GOAL #5   Title Patient will don / doff pull over shirt with no greater than mod assist ( due 08/07/15)   Status Achieved   OT LONG TERM  GOAL #6   Title Patient will complete toilet transfer with min assist  (due 08/07/15)   Status On-going               Plan - 08/09/15 1111    Clinical Impression Statement Patient limited by increased spasticity in right extremities.   Showing limited progress toward goals.     Rehab Potential Fair   Clinical Impairments Affecting Rehab Potential no caregiver support (during rehab affecting carryover)   OT Frequency 2x / week   OT Treatment/Interventions Self-care/ADL training;Therapeutic exercise;Functional Mobility Training;Patient/family education;Balance training;Splinting;Manual Therapy;Neuromuscular education;Ultrasound;Energy conservation;Manual lymph drainage;Therapeutic exercises;Therapeutic activities;DME and/or AE instruction;Parrafin;Cryotherapy;Electrical Stimulation;Fluidtherapy;Cognitive remediation/compensation;Visual/perceptual remediation/compensation;Passive range of motion;Contrast Bath;Moist Heat   OT Home Exercise Plan Education provided:  edema massage, splint wear/care; HEP 07/22/15   Consulted and Agree with Plan of Care Patient      Patient will benefit from skilled therapeutic intervention in order to improve the following deficits and impairments:  Decreased activity tolerance, Decreased balance, Decreased cognition, Decreased coordination, Decreased endurance, Decreased range of motion, Decreased mobility, Decreased knowledge of use of DME, Decreased knowledge of precautions, Decreased strength, Difficulty walking, Increased edema, Impaired perceived functional ability, Impaired flexibility, Obesity, Impaired UE functional use, Impaired tone, Pain, Impaired vision/preception, Improper body mechanics  Visit Diagnosis: Spastic hemiplegia affecting right dominant side (HCC)  Abnormal posture  Other disturbances of skin sensation  Muscle weakness (generalized)  Other lack of coordination  Visuospatial deficit  Pain In Right Arm  Other symptoms and signs  involving cognitive functions following cerebral infarction    Problem List Patient Active Problem List   Diagnosis Date Noted  . Chronic kidney disease (CKD), stage IV (severe) ([name]) 07/06/2015  . Acute CVA (cerebrovascular accident) ([place])   . Acute encephalopathy 07/04/2015  . Sinus bradycardia 07/04/2015  . Type 2 diabetes mellitus with vascular disease ([place]) 07/04/2015  . UTI (lower urinary tract infection) 07/04/2015  . 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 Spastic hemiplegia affecting nondominant side ([place]) 07/01/2015  . Obesity, morbid ([place]) 07/01/2015  . Absolute anemia   . Thrombocytopenia ([place])   . Acute ischemic VBA thalamic stroke ([place]) 01/18/2015  . AKI (acute kidney injury) ([place])   . Dysarthria   . Lethargy   . DM type 2 with diabetic peripheral neuropathy ([place])   . Labile blood pressure   . Hyperlipidemia   . History of CVA with residual deficit   . Chronic obstructive pulmonary disease ([place])   . Hemiparesis, aphasia, and dysphagia as late effect of cerebrovascular accident (CVA) ([place])   . Acute ischemic stroke ([place])   . CVA (cerebral vascular accident) ([place]) 01/14/2015  . CVA (cerebral infarction) 01/14/2015  . Right hemiplegia ([place])   . Right sided weakness 01/13/2015  . Abdominal pain  06/20/2014  . Altered mental status 02/08/2012  . Sleep apnea 12/08/2010  . Morbid obesity ([place]) 12/08/2010  . CHF (congestive heart failure) ([place]) 12/08/2010  . Chronic renal failure 12/08/2010  . TIA (transient ischemic attack) 12/08/2010  . CAD (coronary artery disease) 12/08/2010  . Cellulitis of pubic region 12/01/2010  . Uncontrolled type 2 DM with hyperosmolar nonketotic hyperglycemia ([place]) 12/01/2010    Mariah Milling, OTR/L 08/09/2015, 11:13 AM  [facility] [address] [place], [place], [address] Phone: [phone]   Fax:  [phone]  Name: LABELLA GOLOB MRN: [id number] Date of Birth:  [birth year]

## 2015-08-09 NOTE — Therapy (Signed)
[facility] [address] [place], [place], [address] Phone: [phone]   Fax:  [phone]  Physical Therapy Treatment  Patient Details  Name: Michelle Holmes MRN: [id number] Date of Birth: [birth year] Referring Provider: Alysia Penna, MD  Encounter Date: 08/09/2015      PT End of Session - 08/09/15 1019    Visit Number 21   Number of Visits 25   Date for PT Re-Evaluation 09/20/15   Authorization Type UHC MCR - G Codes and PN required every 10 visits   PT Start Time 0933   PT Stop Time 1015   PT Time Calculation (min) 42 min   Equipment Utilized During Treatment Gait belt;Right knee immobilizer   Activity Tolerance Patient limited by fatigue  Mutliple seated rest breaks required.   Behavior During Therapy [facility] for tasks assessed/performed      Past Medical History  Diagnosis Date  . Emphysema   . Colon cancer ([place])   . Hypertension   . Hypercholesteremia   . Diabetes mellitus   . COPD (chronic obstructive pulmonary disease) ([place])   . Coronary artery disease   . Obesity   . Sleep apnea   . TIA (transient ischemic attack)   . CHF (congestive heart failure) ([place])   . Anemia   . DJD (degenerative joint disease)   . Renal insufficiency   . Chronic back pain   . Scoliosis   . Obesity hypoventilation syndrome ([place])   . Stroke ([place])   . Myocardial infarction [facility])     Past Surgical History  Procedure Laterality Date  . Coronary angioplasty with stent placement    . Colon surgery      There were no vitals filed for this visit.      Subjective Assessment - 08/09/15 0939    Subjective No new complaints. No falls or pain to report. Still unable to find her leg brace at home, reports her daughters are working on it.   Pertinent History PMH significant for: CVA x4, HTN, HLD, CHF, COPD, OSA, CAD, DM II, colon cancer, DJD, scoliosis, chronic renal insufficiency   Patient Stated Goals "I want a new wheelchair  and I want to walk."   Currently in Pain? No/denies   Pain Score 0-No pain            [facility] Adult PT Treatment/Exercise - 08/09/15 1028    Transfers   Transfers Sit to Stand;Stand to Sit;Lateral/Scoot Transfers   Sit to Stand 3: Mod assist;4: Min assist;With upper extremity assist;With armrests;From chair/3-in-1;From bed   Sit to Stand Details Tactile cues for sequencing;Tactile cues for weight shifting;Tactile cues for placement;Verbal cues for sequencing;Verbal cues for technique;Verbal cues for safe use of DME/AE;Manual facilitation for weight shifting   Sit to Stand Details (indicate cue type and reason) pt continues to require less assistance as session progresses with cues on sequencing and technique needed each time   Stand to Sit 3: Mod assist;With upper extremity assist;With armrests;To bed;To chair/3-in-1;Uncontrolled descent   Stand to Sit Details (indicate cue type and reason) Tactile cues for weight shifting;Tactile cues for sequencing;Tactile cues for placement;Verbal cues for technique;Verbal cues for precautions/safety;Manual facilitation for weight shifting   Stand to Sit Details cues needed each time to reach back and assist to control descent   Stand Pivot Transfers 3: Mod assist   Stand Pivot Transfer Details (indicate cue type and reason) wheelchair>mat table with mod assist, cues on sequencing and technique.    Lateral/Scoot Transfers 3:  Mod assist   Lateral/Scoot Transfer Details (indicate cue type and reason) with max cues pt scooted laterally along edge of mat table, ~4-5 reps each way. facilitaiton needed on technique and sequencing   Neuro Re-ed    Neuro Re-ed Details  sit<>stand performed 3 times using right PFRW: in standing worked on lateral weight shifting, anterior/posterior weight shifting, left UE reaching up/out varied directions, and left leg stepping fwd/bwd and laterally. min to mod assist needed for balance with cues/facilitation for posture/ex form and  technique. manual blocking of right knee provided for safety to prevent buckling.  KI applied to right leg to assist with knee control and proprioception: worked on forward reciprocal stepping to promote weight shifitng, increased LE weight bearing and work on more upright posture (PFRW used to assist with this and allow pt surface to balance on). 5-6 steps x 2 with emphasis on weight shifting and LE weight bearing for improved funtional transfers with stand<>pivot and to improve pt's functional standing at home for ADL tasks (dressing, toileting, etc) that will require minimal movements to complete. pt required 2 person min to mod assist with cues/facilitaiton for weight shifing, maintaining base of support and on posture.                                                                                                PT Short Term Goals - 07/22/15 1918    PT SHORT TERM GOAL #1   Title STG's = LTG's           PT Long Term Goals - 08/04/15 1420    PT LONG TERM GOAL #1   Title Pt will consistently perform sit <> stand from mat table with min A using LRAD.  (Target date: 08/19/15)   Status On-going   PT LONG TERM GOAL #2   Title Pt will consistently transfer from w/c <> mat table with mod A using LRAD to indicate increased safety with functional transfers.  (08/19/15)   Status On-going   PT LONG TERM GOAL #3   Title Pt will tolerate standing with total A of standing frame for > /= 10 minutes to indicate increased standing tolerance.  (08/19/15)   Baseline 6/20: REVISED from 5 to 10 minutes due to pt progress   Status Revised   PT LONG TERM GOAL #4   Title Will trial varying types of carbon fiber AFO's during PT sessions to determine if pt appropriate candidate for off-the-shelf AFO.  (08/19/15)   Baseline 7/7: DEFERRED due to pt inability to tolerate wearing R carbon fiber GRAFO's.   Status Deferred            Plan - 08/09/15 1043    Clinical Impression Statement Today's session  focused on transfers and both static/dynamic balance. Decreased adductor tone noted in right leg today. Pt is progressing slowly toward goals.   Rehab Potential Good   Clinical Impairments Affecting Rehab Potential cognitive impairments; limited family support; relies on friend for transportation   PT Frequency 2x / week   PT Duration 4 weeks   PT Treatment/Interventions ADLs/Self Care Home Management;DME Instruction;Vestibular;Gait  training;Neuromuscular re-education;Stair training;Functional mobility training;Therapeutic activities;Patient/family education;Therapeutic exercise;Balance training;Prosthetic Training;Orthotic Fit/Training;Wheelchair mobility training   PT Next Visit Plan . Continue to address transfers and standing w/ use of standing frame (check BP) for tone management and activity tolerance. continue with balance activities using stable surface for pt to balance on with UE (counter, PFRW)   Consulted and Agree with Plan of Care Patient      Patient will benefit from skilled therapeutic intervention in order to improve the following deficits and impairments:  Postural dysfunction, Impaired tone, Abnormal gait, Decreased balance, Decreased activity tolerance, Decreased endurance, Decreased coordination, Decreased mobility, Impaired perceived functional ability, Impaired sensation, Decreased knowledge of use of DME, Decreased strength, Increased edema, Impaired vision/preception, Decreased cognition  Visit Diagnosis: Spastic hemiplegia affecting right dominant side (HCC)  Abnormal posture  Muscle weakness (generalized)     Problem List Patient Active Problem List   Diagnosis Date Noted  . Chronic kidney disease (CKD), stage IV (severe) ([place]) 07/06/2015  . Acute CVA (cerebrovascular accident) ([place])   . Acute encephalopathy 07/04/2015  . Sinus bradycardia 07/04/2015  . Type 2 diabetes mellitus with vascular disease ([place]) 07/04/2015  . UTI (lower urinary tract infection)  07/04/2015  . Parietal lobe infarction ([place]) 07/04/2015  . Spastic hemiplegia affecting nondominant side ([place]) 07/01/2015  . Obesity, morbid ([place]) 07/01/2015  . Absolute anemia   . Thrombocytopenia ([place])   . Acute ischemic VBA thalamic stroke ([place]) 01/18/2015  . AKI (acute kidney injury) ([place])   . Dysarthria   . Lethargy   . DM type 2 with diabetic peripheral neuropathy ([place])   . Labile blood pressure   . Hyperlipidemia   . History of CVA with residual deficit   . Chronic obstructive pulmonary disease ([place])   . Hemiparesis, aphasia, and dysphagia as late effect of cerebrovascular accident (CVA) ([place])   . Acute ischemic stroke ([place])   . CVA (cerebral vascular accident) ([place]) 01/14/2015  . CVA (cerebral infarction) 01/14/2015  . Right hemiplegia ([place])   . Right sided weakness 01/13/2015  . Abdominal pain 06/20/2014  . Altered mental status 02/08/2012  . Sleep apnea 12/08/2010  . Morbid obesity ([place]) 12/08/2010  . CHF (congestive heart failure) ([place]) 12/08/2010  . Chronic renal failure 12/08/2010  . TIA (transient ischemic attack) 12/08/2010  . CAD (coronary artery disease) 12/08/2010  . Cellulitis of pubic region 12/01/2010  . Uncontrolled type 2 DM with hyperosmolar nonketotic hyperglycemia ([place]) 12/01/2010    Willow Ora, PTA, [facility] [address], [place], [place] [address] [phone] 08/09/2015, 10:45 AM   Name: ARIELL PERRAULT MRN: [id number] Date of Birth: [birth year]

## 2015-08-12 ENCOUNTER — Encounter: Payer: Self-pay | Admitting: Physical Therapy

## 2015-08-12 ENCOUNTER — Ambulatory Visit: Payer: 59 | Admitting: Physical Therapy

## 2015-08-12 ENCOUNTER — Ambulatory Visit: Payer: 59 | Admitting: Occupational Therapy

## 2015-08-12 ENCOUNTER — Encounter: Payer: Self-pay | Admitting: Occupational Therapy

## 2015-08-12 DIAGNOSIS — R41842 Visuospatial deficit: Secondary | ICD-10-CM

## 2015-08-12 DIAGNOSIS — M6281 Muscle weakness (generalized): Secondary | ICD-10-CM | POA: Diagnosis not present

## 2015-08-12 DIAGNOSIS — R293 Abnormal posture: Secondary | ICD-10-CM

## 2015-08-12 DIAGNOSIS — I69318 Other symptoms and signs involving cognitive functions following cerebral infarction: Secondary | ICD-10-CM | POA: Diagnosis not present

## 2015-08-12 DIAGNOSIS — R208 Other disturbances of skin sensation: Secondary | ICD-10-CM | POA: Diagnosis not present

## 2015-08-12 DIAGNOSIS — G8111 Spastic hemiplegia affecting right dominant side: Secondary | ICD-10-CM

## 2015-08-12 DIAGNOSIS — R531 Weakness: Secondary | ICD-10-CM | POA: Diagnosis not present

## 2015-08-12 DIAGNOSIS — R2689 Other abnormalities of gait and mobility: Secondary | ICD-10-CM | POA: Diagnosis not present

## 2015-08-12 DIAGNOSIS — M79601 Pain in right arm: Secondary | ICD-10-CM

## 2015-08-12 DIAGNOSIS — S0003XS Contusion of scalp, sequela: Secondary | ICD-10-CM | POA: Diagnosis not present

## 2015-08-12 DIAGNOSIS — R278 Other lack of coordination: Secondary | ICD-10-CM

## 2015-08-12 DIAGNOSIS — M545 Low back pain: Secondary | ICD-10-CM | POA: Diagnosis not present

## 2015-08-12 DIAGNOSIS — S233XXS Sprain of ligaments of thoracic spine, sequela: Secondary | ICD-10-CM | POA: Diagnosis not present

## 2015-08-12 NOTE — Therapy (Signed)
[facility] [address] [place], [place], [address] Phone: [phone]   Fax:  [phone]  Occupational Therapy Treatment  Patient Details  Name: Michelle Holmes MRN: [id number] Date of Birth: 11-23-1950 Referring Provider: Dr. Alysia Penna  Encounter Date: 08/12/2015      OT End of Session - 08/12/15 1233    Visit Number 16   Number of Visits 18   Date for OT Re-Evaluation 09/05/15   Authorization Type UHC Medicare (no visit limit, no auth, G-code needed); medicaid 2nd   Authorization Time Period cert. 123456, re-cert.  [id number]; re-cert. 07/22/15-09/20/15 due to hospitalizations   Authorization - Visit Number 16   Authorization - Number of Visits 20   OT Start Time 1100   OT Stop Time 1140   OT Time Calculation (min) 40 min   Activity Tolerance Patient limited by fatigue   Behavior During Therapy [facility] for tasks assessed/performed      Past Medical History  Diagnosis Date  . Emphysema   . Colon cancer ([place])   . Hypertension   . Hypercholesteremia   . Diabetes mellitus   . COPD (chronic obstructive pulmonary disease) ([place])   . Coronary artery disease   . Obesity   . Sleep apnea   . TIA (transient ischemic attack)   . CHF (congestive heart failure) ([place])   . Anemia   . DJD (degenerative joint disease)   . Renal insufficiency   . Chronic back pain   . Scoliosis   . Obesity hypoventilation syndrome ([place])   . Stroke ([place])   . Myocardial infarction [facility])     Past Surgical History  Procedure Laterality Date  . Coronary angioplasty with stent placement    . Colon surgery      There were no vitals filed for this visit.      Subjective Assessment - 08/12/15 1205    Subjective  It is still difficult to get it on (regarding new hand orthosis) it is so tight (right hand)   Pertinent History CVA 01/13/16, hx of previous CVA with risidual R-sided weakness, hospitalization 05/05/15 with possible  TIA or seizure disorder; also hx of HTN, MI, CHF, CAD, DM, COPD, emphysema, sleep apnea, chronic back pain, scoliosis, DJD, renal insufficiency, anemia, and colon cancer.   Limitations cognitive deficits, inconsistent participation   Patient Stated Goals get back to "all that I did before"; when questioned pt stated that she wanted to work on toileting transfer ( but inconsistent responses)   Currently in Pain? Yes   Pain Score 0-No pain  winces with Passive motion in shoulder and digits   Pain Location Hand   Pain Orientation Right   Pain Type Other (Comment)   Pain Onset Other (comment)   Pain Frequency Intermittent   Aggravating Factors  Spasticity, porr alignment, range of motion   Pain Relieving Factors alignment of right side                      OT Treatments/Exercises (OP) - 08/12/15 1213    Neurological Re-education Exercises   Other Exercises 1 Patient with increased tension throughout right arm.  Patient seated with weight strongly biased posteriorly and to left side.  By aligning trunk over base of support, noticed slight decrease in tension of right arm.  By shifting weight toward more involved right side felt significant decrease in tension in right arm.  Regardless, patient drifts back to her preferred sitting posture without facilitation.  Patient tearful this session, stating she did not bring her leg brace becasue she had another appointment.  She reports having an dMD appointment to address sleep concerns.  Patient hopes to bring leg brace next session.     Other Exercises 2 Patient initially not receptive to addressing safe transfers to / from wheelchair, but with coaxing - willing to participate.  Patient needs facilitation for forward weight shift with squat pivot transfers, or sit to stand transition.  Once forward, patient bale now to [facility] self sufficiently for scoot to left or right.     Manual Therapy   Joint Mobilization Patient with spasticity in  wrist and finger flexors, With traction and gentle range toward neutral wrist, able to begin to extend fingers en masse.  Completed wrist and digit alignment.                  OT Education - 08/12/15 1232    Education provided Yes   Education Details Discussion regarding MD appointment 7/21, and OT upcoming discharge.   Person(s) Educated Patient   Methods Explanation   Comprehension Verbalized understanding;Need further instruction             OT Long Term Goals - 08/06/15 2334    OT LONG TERM GOAL #5   Title Patient will don / doff pull over shirt with no greater than mod assist ( due 08/07/15)   Status Achieved   OT LONG TERM GOAL #6   Title Patient will complete toilet transfer with min assist  (due 08/07/15)   Status On-going               Plan - 08/12/15 1233    Clinical Impression Statement Patient limited by increase in spasticity in right extremities, showing limited progress toward OT goals.     Rehab Potential Fair   Clinical Impairments Affecting Rehab Potential no caregiver support (during rehab affecting carryover)   OT Frequency 2x / week   OT Duration Other (comment)   OT Treatment/Interventions Self-care/ADL training;Therapeutic exercise;Functional Mobility Training;Patient/family education;Balance training;Splinting;Manual Therapy;Neuromuscular education;Ultrasound;Energy conservation;Manual lymph drainage;Therapeutic exercises;Therapeutic activities;DME and/or AE instruction;Parrafin;Cryotherapy;Electrical Stimulation;Fluidtherapy;Cognitive remediation/compensation;Visual/perceptual remediation/compensation;Passive range of motion;Contrast Bath;Moist Heat   Plan Functional mobility, NMR RUE - Postural control, ADL   OT Home Exercise Plan Education provided:  edema massage, splint wear/care; HEP 07/22/15   Consulted and Agree with Plan of Care Patient      Patient will benefit from skilled therapeutic intervention in order to improve the  following deficits and impairments:  Decreased activity tolerance, Decreased balance, Decreased cognition, Decreased coordination, Decreased endurance, Decreased range of motion, Decreased mobility, Decreased knowledge of use of DME, Decreased knowledge of precautions, Decreased strength, Difficulty walking, Increased edema, Impaired perceived functional ability, Impaired flexibility, Obesity, Impaired UE functional use, Impaired tone, Pain, Impaired vision/preception, Improper body mechanics  Visit Diagnosis: Spastic hemiplegia affecting right dominant side (HCC)  Abnormal posture  Muscle weakness (generalized)  Other disturbances of skin sensation  Other lack of coordination  Visuospatial deficit  Pain In Right Arm  Other symptoms and signs involving cognitive functions following cerebral infarction    Problem List Patient Active Problem List   Diagnosis Date Noted  . Chronic kidney disease (CKD), stage IV (severe) ([place]) 07/06/2015  . Acute CVA (cerebrovascular accident) ([place])   . Acute encephalopathy 07/04/2015  . Sinus bradycardia 07/04/2015  . Type 2 diabetes mellitus with vascular disease ([place]) 07/04/2015  . UTI (lower urinary tract infection) 07/04/2015  . 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  Spastic hemiplegia affecting nondominant side ([place]) 07/01/2015  . Obesity, morbid ([place]) 07/01/2015  . Absolute anemia   . Thrombocytopenia ([place])   . Acute ischemic VBA thalamic stroke ([place]) 01/18/2015  . AKI (acute kidney injury) ([place])   . Dysarthria   . Lethargy   . DM type 2 with diabetic peripheral neuropathy ([place])   . Labile blood pressure   . Hyperlipidemia   . History of CVA with residual deficit   . Chronic obstructive pulmonary disease ([place])   . Hemiparesis, aphasia, and dysphagia as late effect of cerebrovascular accident (CVA) ([place])   . Acute ischemic stroke ([place])   . CVA (cerebral vascular accident) ([place]) 01/14/2015  . CVA (cerebral infarction) 01/14/2015   . Right hemiplegia ([place])   . Right sided weakness 01/13/2015  . Abdominal pain 06/20/2014  . Altered mental status 02/08/2012  . Sleep apnea 12/08/2010  . Morbid obesity ([place]) 12/08/2010  . CHF (congestive heart failure) ([place]) 12/08/2010  . Chronic renal failure 12/08/2010  . TIA (transient ischemic attack) 12/08/2010  . CAD (coronary artery disease) 12/08/2010  . Cellulitis of pubic region 12/01/2010  . Uncontrolled type 2 DM with hyperosmolar nonketotic hyperglycemia ([place]) 12/01/2010    Mariah Milling, OTR/L 08/12/2015, 12:36 PM  [facility] [address] [place], [place], [address] Phone: [phone]   Fax:  [phone]  Name: ACE HOCUTT MRN: [id number] Date of Birth: 14-Apr-1950

## 2015-08-12 NOTE — Therapy (Signed)
[facility] [address] [place], [place], [address] Phone: [phone]   Fax:  [phone]  Physical Therapy Treatment  Patient Details  Name: Michelle Holmes MRN: [id number] Date of Birth: 06/17/1950 Referring Provider: Alysia Penna, MD  Encounter Date: 08/12/2015      PT End of Session - 08/12/15 1026    Visit Number 22   Number of Visits 25   Date for PT Re-Evaluation 09/20/15   Authorization Type [employer] MCR - G Codes and PN required every 10 visits   PT Start Time 1019   PT Stop Time 1100   PT Time Calculation (min) 41 min   Equipment Utilized During Treatment Gait belt;Right knee immobilizer   Activity Tolerance Patient limited by fatigue;Patient tolerated treatment well  3 seated rest breaks needed today   Behavior During Therapy [facility] for tasks assessed/performed      Past Medical History  Diagnosis Date  . Emphysema   . Colon cancer ([place])   . Hypertension   . Hypercholesteremia   . Diabetes mellitus   . COPD (chronic obstructive pulmonary disease) ([place])   . Coronary artery disease   . Obesity   . Sleep apnea   . TIA (transient ischemic attack)   . CHF (congestive heart failure) ([place])   . Anemia   . DJD (degenerative joint disease)   . Renal insufficiency   . Chronic back pain   . Scoliosis   . Obesity hypoventilation syndrome ([place])   . Stroke ([place])   . Myocardial infarction [facility])     Past Surgical History  Procedure Laterality Date  . Coronary angioplasty with stent placement    . Colon surgery      There were no vitals filed for this visit.      Subjective Assessment - 08/12/15 1025    Subjective "I found my brace". Does not have it with her today "I had another appt this am with the doctor". Pt is to bring the brace at her next visit. "When you going to teach be how to walk?". Discussed need to bring in brace and need to be able to stand before pushing walking.   Pertinent History PMH  significant for: CVA x4, HTN, HLD, CHF, COPD, OSA, CAD, DM II, colon cancer, DJD, scoliosis, chronic renal insufficiency   Patient Stated Goals "I want a new wheelchair and I want to walk."   Currently in Pain? No/denies   Pain Score 0-No pain             [facility] Adult PT Treatment/Exercise - 08/12/15 1034    Transfers   Transfers Sit to Stand;Stand to Sit;Lateral/Scoot Transfers   Sit to Stand 4: Min assist;3: Mod assist;With upper extremity assist;From bed   Sit to Stand Details Tactile cues for sequencing;Tactile cues for weight shifting;Tactile cues for placement;Verbal cues for sequencing;Verbal cues for technique;Verbal cues for safe use of DME/AE;Manual facilitation for weight shifting   Stand to Sit 3: Mod assist;With upper extremity assist;To bed   Stand to Sit Details (indicate cue type and reason) Tactile cues for weight shifting;Tactile cues for sequencing;Tactile cues for placement;Verbal cues for technique;Verbal cues for precautions/safety;Manual facilitation for weight shifting   Stand Pivot Transfers 3: Mod assist   Stand Pivot Transfer Details (indicate cue type and reason) wheelchair to mat table with right knee blocked to prevent buckling.   Transfer via [place] Other/comments  standing frame use with sit<>stand x 1 rep   Comments standing with PFRW  and manual blocking of right knee in standing to prevent buckling: x 3 reps. min assist of 1 person for static standing balance with second person for balance for with dynamic activity: 2 person min assist with right knee blocked to prevent buckling. Worked on fwd/bwd stepping of left foot and lateral stepping of left foot. Pt needed max cues to maintain upright posture with stepping. Assist needed for weight shifting, walker stability and right knee stability with dynamic activity.    Neuro Re-ed    Neuro Re-ed Details  in standing frame: worked on upright posture, equal LE weight bearing: stood in frame x 10 minutes.  worked on weight shifting, trunk extension into flexion and reaching all directions/moving cones all directions with left UE. cues on posture, ex form needed.             PT Short Term Goals - 07/22/15 1918    PT SHORT TERM GOAL #1   Title STG's = LTG's           PT Long Term Goals - 08/04/15 1420    PT LONG TERM GOAL #1   Title Pt will consistently perform sit <> stand from mat table with min A using LRAD.  (Target date: 08/19/15)   Status On-going   PT LONG TERM GOAL #2   Title Pt will consistently transfer from w/c <> mat table with mod A using LRAD to indicate increased safety with functional transfers.  (08/19/15)   Status On-going   PT LONG TERM GOAL #3   Title Pt will tolerate standing with total A of standing frame for > /= 10 minutes to indicate increased standing tolerance.  (08/19/15)   Baseline 6/20: REVISED from 5 to 10 minutes due to pt progress   Status Revised   PT LONG TERM GOAL #4   Title Will trial varying types of carbon fiber AFO's during PT sessions to determine if pt appropriate candidate for off-the-shelf AFO.  (08/19/15)   Baseline 7/7: DEFERRED due to pt inability to tolerate wearing R carbon fiber GRAFO's.   Status Deferred          Plan - 08/12/15 1027    Clinical Impression Statement Today's skilled session focused on standing posture (with standing frame/PFRW) and on balance (with PFRW). Pt with increased standing tolerance with using standing frame for support vs walker. Pt making progress toward goals with standing frame goal met today. Pt is to bring in her AFO next visist.    Rehab Potential Good   Clinical Impairments Affecting Rehab Potential cognitive impairments; limited family support; relies on friend for transportation   PT Frequency 2x / week   PT Duration 4 weeks   PT Treatment/Interventions ADLs/Self Care Home Management;DME Instruction;Vestibular;Gait training;Neuromuscular re-education;Stair training;Functional mobility  training;Therapeutic activities;Patient/family education;Therapeutic exercise;Balance training;Prosthetic Training;Orthotic Fit/Training;Wheelchair mobility training   PT Next Visit Plan . Continue to address transfers and standing w/ use of standing frame (check BP) for tone management and activity tolerance. continue with balance activities using stable surface for pt to balance on with UE (counter, PFRW)   Consulted and Agree with Plan of Care Patient      Patient will benefit from skilled therapeutic intervention in order to improve the following deficits and impairments:  Postural dysfunction, Impaired tone, Abnormal gait, Decreased balance, Decreased activity tolerance, Decreased endurance, Decreased coordination, Decreased mobility, Impaired perceived functional ability, Impaired sensation, Decreased knowledge of use of DME, Decreased strength, Increased edema, Impaired vision/preception, Decreased cognition  Visit Diagnosis: Spastic hemiplegia affecting  right dominant side (HCC)  Abnormal posture  Muscle weakness (generalized)     Problem List Patient Active Problem List   Diagnosis Date Noted  . Chronic kidney disease (CKD), stage IV (severe) ([place]) 07/06/2015  . Acute CVA (cerebrovascular accident) ([place])   . Acute encephalopathy 07/04/2015  . Sinus bradycardia 07/04/2015  . Type 2 diabetes mellitus with vascular disease ([place]) 07/04/2015  . UTI (lower urinary tract infection) 07/04/2015  . Parietal lobe infarction ([place]) 07/04/2015  . Spastic hemiplegia affecting nondominant side ([place]) 07/01/2015  . Obesity, morbid ([place]) 07/01/2015  . Absolute anemia   . Thrombocytopenia ([place])   . Acute ischemic VBA thalamic stroke ([place]) 01/18/2015  . AKI (acute kidney injury) ([place])   . Dysarthria   . Lethargy   . DM type 2 with diabetic peripheral neuropathy ([place])   . Labile blood pressure   . Hyperlipidemia   . History of CVA with residual deficit   . Chronic obstructive pulmonary  disease ([place])   . Hemiparesis, aphasia, and dysphagia as late effect of cerebrovascular accident (CVA) ([place])   . Acute ischemic stroke ([place])   . CVA (cerebral vascular accident) ([place]) 01/14/2015  . CVA (cerebral infarction) 01/14/2015  . Right hemiplegia ([place])   . Right sided weakness 01/13/2015  . Abdominal pain 06/20/2014  . Altered mental status 02/08/2012  . Sleep apnea 12/08/2010  . Morbid obesity ([place]) 12/08/2010  . CHF (congestive heart failure) ([place]) 12/08/2010  . Chronic renal failure 12/08/2010  . TIA (transient ischemic attack) 12/08/2010  . CAD (coronary artery disease) 12/08/2010  . Cellulitis of pubic region 12/01/2010  . Uncontrolled type 2 DM with hyperosmolar nonketotic hyperglycemia ([place]) 12/01/2010    Willow Ora, PTA, [facility] [address], [place], [place] [address] [phone] 08/12/2015, 3:29 PM   Name: INIS BORNEMAN MRN: [id number] Date of Birth: 1950/07/25

## 2015-08-13 ENCOUNTER — Encounter: Payer: Self-pay | Admitting: Neurology

## 2015-08-13 ENCOUNTER — Ambulatory Visit (INDEPENDENT_AMBULATORY_CARE_PROVIDER_SITE_OTHER): Payer: Medicare Other | Admitting: Neurology

## 2015-08-13 VITALS — BP 144/82 | HR 86 | Resp 20

## 2015-08-13 DIAGNOSIS — I63032 Cerebral infarction due to thrombosis of left carotid artery: Secondary | ICD-10-CM

## 2015-08-13 DIAGNOSIS — G4730 Sleep apnea, unspecified: Secondary | ICD-10-CM

## 2015-08-13 DIAGNOSIS — G4710 Hypersomnia, unspecified: Secondary | ICD-10-CM | POA: Diagnosis not present

## 2015-08-13 DIAGNOSIS — T733XXA Exhaustion due to excessive exertion, initial encounter: Secondary | ICD-10-CM

## 2015-08-13 DIAGNOSIS — R0683 Snoring: Secondary | ICD-10-CM | POA: Diagnosis not present

## 2015-08-13 NOTE — Progress Notes (Signed)
SLEEP MEDICINE CLINIC   Provider:  Larey Seat, M D  Referring Provider: Ricke Hey, MD Primary Care Physician:  Ricke Hey, MD  Chief Complaint  Patient presents with  . New Patient (Initial Visit)    snoring, never had sleep study, dr Leonie Man referral    HPI:  Michelle Holmes is a 65 y.o. female , seen here as a referral/ revisit  from Dr. Leonie Man and PCP  [place] for a sleep study   Chief complaint according to patient : [name] had been readmitted for her second stroke in April 2017, she had suffered a stroke in December 2016 as well. She had followed up with Dr. Leonie Man outpatient on 07/01/2015 she has as history of diabetes mellitus, hypertension, high cholesterol, COPD and coronary artery disease. Her initial presentation involved right-sided weakness which has started about at 10:30 PM the night prior to her hospital admission her blood sugar was also very high 504 mmol per deciliter in the emergency room she had been taking aspirin and Plavix daily. Frontal encephalomalacia was known from a previous stroke. She was beyond time for a tPA assessment. She had significant right sided spastic hemiparesis and for safety and convenience she is using a wheelchair. She is not able to walk with a walker at least not safely. She is here with a friend, who is unaware of the correlation of stroke and OSA and we spent 15 minutes to discuss these factors and risk assessment.  Sleep habits are as follows:        OFFICE FOLLOW-UP NOTE  Michelle Holmes Date of Birth:  [birth year] Medical Record Number:  [id number]   HPI: [name] is a 65 year old African-American lady seen today for first office follow-up visit for in-hospital admission for stroke in December 2016 and TIA in April 2017. She is accompanied by her female friend. Michelle Holmes is an 65 y.o. female with history of diabetes mellitus, hypertension, hypercholesterolemia, COPD, previous stroke, and coronary artery disease,  presenting with exacerbation of weakness involving her right side starting at 10:30 PM last night. Patient has also had abated blood sugar, including a level of 504 in the emergency room today. His been taking aspirin and Plavix daily. CT scan of her head acute changes. Frontal encephalomalacia from previous stroke was noted. NIH stroke score was 7. LSN: 10:30 PM on 01/12/2015  tPA Given: No: Beyond time under for treatment consideration. MRI scan of the brain showed acute subcortical infarcts as well as old infarcts. She has significant spastic right hemiplegia. She has not been any improvement with therapy. She is currently living at home. She is getting outpatient therapy but is unable to get up and walk even with the help of therapist. She's able to move the right leg from the hip  a bit but has no strength in the right hand. She has pain and spasticity even with passive movements in the right hand. She was briefly readmitted in April 2017 with slurred speech and increased weakness. MRI scan of the brain was obtained which I have reviewed and does not show an acute infarct. LDL cholesterol 65 g percent. Hemoglobin 1107.6. MRI of the brain showed no acute occlusion. Echo and Dopplers were not repeated as they were done in December 2016 and unremarkable. She is on aspirin and Plavix and having minor bruising but no bleeding episodes. She does know her and is overweight but has never been evaluated for sleep apnea.   The patient usually endorses  a bedtime of 10 PM, it'll usually take for less than half an hour to go to sleep. He sleeps on one pillow usually prefers to sleep on her side but now is not able to turn and move in bed as she used to. The bedroom is cool, quiet and [place]. She usually arises in the morning at about 6:00. She usually has to call for help to assist with standing up, transferring to the bathroom and with clothing. Her friend has endorsed that she snores rather loudly, he has also  witnessed sleep apnea. She wakes often with a headache.   Review of Systems: Out of a complete 14 system review, the patient complains of only the following symptoms, and all other reviewed systems are negative.  snoring,  Tired, fatigued, sleepiness. Dreamless sleep. Nocturia.   Epworth score 9-10  , Fatigue severity score 54  , depression score 9/15    Social History   Social History  . Marital Status: Widowed    Spouse Name: N/A  . Number of Children: N/A  . Years of Education: N/A   Occupational History  . Not on file.   Social History Main Topics  . Smoking status: Former Research scientist (life sciences)  . Smokeless tobacco: Not on file  . Alcohol Use: No  . Drug Use: No  . Sexual Activity: Not on file   Other Topics Concern  . Not on file   Social History Narrative    Family History  Problem Relation Age of Onset  . Stroke Maternal Aunt     Past Medical History  Diagnosis Date  . Emphysema   . Colon cancer ([place])   . Hypertension   . Hypercholesteremia   . Diabetes mellitus   . COPD (chronic obstructive pulmonary disease) ([place])   . Coronary artery disease   . Obesity   . Sleep apnea   . TIA (transient ischemic attack)   . CHF (congestive heart failure) ([place])   . Anemia   . DJD (degenerative joint disease)   . Renal insufficiency   . Chronic back pain   . Scoliosis   . Obesity hypoventilation syndrome ([place])   . Stroke ([place])   . Myocardial infarction [facility])     Past Surgical History  Procedure Laterality Date  . Coronary angioplasty with stent placement    . Colon surgery      Current Outpatient Prescriptions  Medication Sig Dispense Refill  . ADVAIR DISKUS 250-50 MCG/DOSE AEPB Inhale 1 puff into the lungs daily as needed (shortness of breath).     Marland Kitchen amLODipine (NORVASC) 5 MG tablet Take 5 mg by mouth at bedtime.    Marland Kitchen aspirin EC 325 MG tablet Take 1 tablet (325 mg total) by mouth daily. 30 tablet 1  . atorvastatin (LIPITOR) 80 MG tablet Take 1 tablet (80 mg total) by  mouth daily at 6 PM. 30 tablet 0  . clopidogrel (PLAVIX) 75 MG tablet Take 1 tablet (75 mg total) by mouth daily. 30 tablet 3  . fenofibrate 54 MG tablet Take 1 tablet (54 mg total) by mouth daily. 30 tablet 1  . ferrous sulfate 325 (65 FE) MG tablet Take 1 tablet (325 mg total) by mouth 2 (two) times daily with a meal. 60 tablet 3  . glipiZIDE (GLUCOTROL) 5 MG tablet Take 1 tablet (5 mg total) by mouth daily before breakfast. 30 tablet 1  . Insulin Glargine (LANTUS SOLOSTAR) 100 UNIT/ML Solostar Pen Inject 30 Units into the skin at bedtime.    Marland Kitchen  LYRICA 50 MG capsule Take 1 capsule (50 mg total) by mouth daily. 60 capsule 3  . metoprolol succinate (TOPROL-XL) 50 MG 24 hr tablet Take 50 mg by mouth daily. Take with or immediately following a meal.    . Oxycodone HCl 20 MG TABS Please take 10 mg oral 2 times daily, and try to wean .  0  . pantoprazole (PROTONIX) 40 MG tablet Take 40 mg by mouth daily.     No current facility-administered medications for this visit.    Allergies as of 08/13/2015 - Review Complete 08/13/2015  Allergen Reaction Noted  . Penicillins Hives and Swelling 12/01/2010    Vitals: BP 144/82 mmHg  Pulse 86  Resp 20  Ht   Wt  Last Weight:  Wt Readings from Last 1 Encounters:  07/04/15 226 lb (102.513 kg)   [id number] is no weight on file to calculate BMI.     Last Height:   Ht Readings from Last 1 Encounters:  07/01/15 5\' 5"  (1.651 m)    Physical exam:  General: The patient is awake, alert and appears not in acute distress. The patient is well groomed. Head: Normocephalic, atraumatic. Neck is supple. Mallampati 3, with a deviated uvula. Facial asymmetry.   neck circumference: 18 inches . Nasal airflow not patent , TMJ is  Not  evident . Retrognathia is seen.  Cardiovascular:  Regular rate and rhythm , without  murmurs or carotid bruit, and without distended neck veins. Respiratory: Lungs are clear to auscultation. Skin:  Without evidence of edema, or  rash Trunk: BMI is elevated  The patient's posture is hunched.   Neurologic exam : The patient is awake and alert, oriented to place and time.   Memory subjective described as intact. Attention span & concentration ability appears normal.  Speech is non fluent,  without  dysarthria,  Has dysphonia and  aphasia.  Slurred speech. Mood and affect are appropriate.  Cranial nerves: Pupils are equal and briskly reactive to light . Extraocular movements  in vertical and horizontal planes intact and without nystagmus. Visual fields by finger perimetry are intact. Hearing to finger rub intact.   Facial sensation intact to fine touch.  Facial motor strength is symmetric and tongue and uvula move midline. Shoulder shrug was symmetrical.   Motor: right hemiparesis, spatic, in arm and leg.    Spastic right-sided hemiparesis with minimal right upper extremity strength, she is moving her right arm with the help of her left arm. The right hand-assisted, the arm is flexed. Right lower extremity is extended, upgoing Babinski, foot drop.   Coordination cannot be tested she cannot arise from a chair without assistance and has not been able to stand without assistance. This is unchanged since her initial stroke. Sensory:  Fine touch, pinprick and vibration were normal.   A 2-D echo from 01-14-15 had shown an ejection fraction of 55, MRI brain from April 10 showed no acute intracranial finding and the stroke symptoms were attributed to a TIA.   The patient was advised of the nature of the diagnosed sleep disorder , the treatment options and risks for general a health and wellness arising from not treating the condition.  I spent more than 40 minutes of face to face time with the patient. Greater than 50% of time was spent in counseling and coordination of care. We have discussed the diagnosis and differential and I answered the patient's questions.     Assessment:  After physical and neurologic examination,  review of  laboratory studies,  Personal review of imaging studies, reports of other /same  Imaging studies ,  Results of polysomnography/ neurophysiology testing and pre-existing records as far as provided in visit., my assessment is   1) [name] has some significant risk factors for obstructive sleep apnea, she was placed on a trial of baclofen for spasticity and pain which also can enhance apnea. Her diabetes was uncontrolled sometimes the control of obstructive sleep apnea will help with maintaining better blood glucose levels overnight. She will continue with Plavix in a generic form and 81 mm of aspirin a day, Dr. Willaim Rayas already discussed a-diet the benefits hypertension, hyperlipidemia and diabetes. I will see the patient after her sleep study.   Plan:  Treatment plan and additional workup : 2) SPLIT with one on one.     Asencion Partridge Davin Archuletta MD  08/13/2015   CC: Ricke Hey, Md [address] [place], [place] [address]

## 2015-08-14 ENCOUNTER — Ambulatory Visit: Payer: 59 | Admitting: Physical Therapy

## 2015-08-14 ENCOUNTER — Ambulatory Visit: Payer: 59 | Admitting: Occupational Therapy

## 2015-08-14 ENCOUNTER — Encounter: Payer: Self-pay | Admitting: Occupational Therapy

## 2015-08-14 VITALS — BP 148/82 | HR 74

## 2015-08-14 DIAGNOSIS — R278 Other lack of coordination: Secondary | ICD-10-CM | POA: Diagnosis not present

## 2015-08-14 DIAGNOSIS — M6281 Muscle weakness (generalized): Secondary | ICD-10-CM

## 2015-08-14 DIAGNOSIS — M79601 Pain in right arm: Secondary | ICD-10-CM

## 2015-08-14 DIAGNOSIS — I69318 Other symptoms and signs involving cognitive functions following cerebral infarction: Secondary | ICD-10-CM | POA: Diagnosis not present

## 2015-08-14 DIAGNOSIS — R2689 Other abnormalities of gait and mobility: Secondary | ICD-10-CM | POA: Diagnosis not present

## 2015-08-14 DIAGNOSIS — R208 Other disturbances of skin sensation: Secondary | ICD-10-CM | POA: Diagnosis not present

## 2015-08-14 DIAGNOSIS — R41842 Visuospatial deficit: Secondary | ICD-10-CM

## 2015-08-14 DIAGNOSIS — R293 Abnormal posture: Secondary | ICD-10-CM

## 2015-08-14 DIAGNOSIS — I509 Heart failure, unspecified: Secondary | ICD-10-CM | POA: Diagnosis not present

## 2015-08-14 DIAGNOSIS — G8111 Spastic hemiplegia affecting right dominant side: Secondary | ICD-10-CM | POA: Diagnosis not present

## 2015-08-14 NOTE — Therapy (Signed)
[facility] [address] [place], [place], [address] Phone: [phone]   Fax:  [phone]  Occupational Therapy Treatment  Patient Details  Name: Michelle Holmes MRN: [id number] Date of Birth: 26-Sep-1950 Referring Provider: Dr. Alysia Penna  Encounter Date: 08/14/2015      OT End of Session - 08/14/15 1032    OT Start Time --  PATIENT ARRIVED LATE      Past Medical History  Diagnosis Date  . Emphysema   . Colon cancer ([place])   . Hypertension   . Hypercholesteremia   . Diabetes mellitus   . COPD (chronic obstructive pulmonary disease) ([place])   . Coronary artery disease   . Obesity   . Sleep apnea   . TIA (transient ischemic attack)   . CHF (congestive heart failure) ([place])   . Anemia   . DJD (degenerative joint disease)   . Renal insufficiency   . Chronic back pain   . Scoliosis   . Obesity hypoventilation syndrome ([place])   . Stroke ([place])   . Myocardial infarction [facility])     Past Surgical History  Procedure Laterality Date  . Coronary angioplasty with stent placement    . Colon surgery      There were no vitals filed for this visit.      Subjective Assessment - 08/14/15 1007    Subjective  Patient unsure why she had recent appointment relating to sleep apnea   Pertinent History CVA 01/13/16, hx of previous CVA with risidual R-sided weakness, hospitalization 05/05/15 with possible TIA or seizure disorder; also hx of HTN, MI, CHF, CAD, DM, COPD, emphysema, sleep apnea, chronic back pain, scoliosis, DJD, renal insufficiency, anemia, and colon cancer.   Limitations cognitive deficits, inconsistent participation   Patient Stated Goals get back to "all that I did before"; when questioned pt stated that she wanted to work on toileting transfer ( but inconsistent responses)   Currently in Pain? No/denies   Pain Score 0-No pain                      OT Treatments/Exercises (OP) - 08/14/15 0001     ADLs   LB Dressing Patient became angry when asked to put on her shoes - changing into shoes with brace for PT session.  Patient declined initially - when given time, patient did put on tennis shoe on left foot with min assist, and assist to tie shoes.  Patient has very little frustration tolerance, and poor insight into how practice can help witrh independence with ADL.     Neurological Re-education Exercises   Other Exercises 1 Patient has active motion in right arm, but difficult to activate through spasticity, and poor awareness.  During each session able to facilitate active motion, but little carryover from session to session for progression.     Other Exercises 2 Patient with best function in right UE when postural control addressed first.  Patient continues with strong posterior / left bias. Reviewed PROM for right elbow, forearm.      Manual Therapy   Joint Mobilization Patient with spasticity in wrist and finger flexors, With traction and gentle range toward neutral wrist, able to begin to extend fingers en masse.  Completed wrist and digit alignment.                  OT Education - 08/14/15 1027    Education provided Yes   Education Details Reviewed PROM elbow, forearm,  reviewed paln to discharge after next visit.     Person(s) Educated Patient   Methods Explanation   Comprehension Verbalized understanding;Need further instruction             OT Long Term Goals - 08/14/15 1014    OT LONG TERM GOAL #1   Title Pt will perform self PROM HEP with mod prompts/cueing.--due 08/07/15   Status Not Met  Patient has been shown PROM for hand, elbow, forearm - consistently performs ROM on fingers only.    OT LONG TERM GOAL #2   Title Pt will participate in functional activities for at least 8mn with mod prompts.   Status Achieved   OT LONG TERM GOAL #3   Title Pt will tolerate/allow gentle PROM to RUE at least 75% of the time.   Status Achieved   OT LONG TERM GOAL #4    Title Pt [facility]   Status Achieved   OT LONG TERM GOAL #5   Title Patient will don / doff pull over shirt with no greater than mod assist ( due 08/07/15)   Status Achieved               Plan - 08/14/15 1029    Clinical Impression Statement Patient with limited further progress, and will discharge as planned on next visit.  Have communicated with MD regarding spasticity management- as patient now having hand pain, and no longer able to don either resting hand splint.     Rehab Potential Fair   Clinical Impairments Affecting Rehab Potential no caregiver support (during rehab affecting carryover)   OT Frequency 2x / week   OT Duration Other (comment)   OT Treatment/Interventions Self-care/ADL training;Therapeutic exercise;Functional Mobility Training;Patient/family education;Balance training;Splinting;Manual Therapy;Neuromuscular education;Ultrasound;Energy conservation;Manual lymph drainage;Therapeutic exercises;Therapeutic activities;DME and/or AE instruction;Parrafin;Cryotherapy;Electrical Stimulation;Fluidtherapy;Cognitive remediation/compensation;Visual/perceptual remediation/compensation;Passive range of motion;Contrast Bath;Moist Heat   Plan Review remaining goal - toilet transfer as allowed, discharge from [place]oleEducation provided:  edema massage, splint wear/care; HEP 07/22/15   Consulted and Agree with Plan of Care Patient      Patient will benefit from skilled therapeutic intervention in order to improve the following deficits and impairments:  Decreased activity tolerance, Decreased balance, Decreased cognition, Decreased coordination, Decreased endurance, Decreased range of motion, Decreased mobility, Decreased knowledge of use of DME, Decreased knowledge of precautions, Decreased strength, Difficulty walking, Increased edema, Impaired perceived functional ability, Impaired flexibility, Obesity, Impaired UE functional  use, Impaired tone, Pain, Impaired vision/preception, Improper body mechanics  Visit Diagnosis: Spastic hemiplegia affecting right dominant side (HCC)  Abnormal posture  Muscle weakness (generalized)  Other disturbances of skin sensation  Other lack of coordination  Visuospatial deficit  Pain In Right Arm  Other symptoms and signs involving cognitive functions following cerebral infarction    Problem List Patient Active Problem List   Diagnosis Date Noted  . Chronic kidney disease (CKD), stage IV (severe) ([place] 07/06/2015  . Acute CVA (cerebrovascular accident) ([place]   . Acute encephalopathy 07/04/2015  . Sinus bradycardia 07/04/2015  . Type 2 diabetes mellitus with vascular disease ([place] [employer] 07/04/2015  . UTI (lower urinary tract infection) 07/04/2015  . Parietal lobe infarction ([place] 07/04/2015  . Spastic hemiplegia affecting nondominant side ([place] 07/01/2015  . Obesity, morbid ([place] 07/01/2015  . Absolute anemia   . Thrombocytopenia ([place]   . Acute ischemic VBA thalamic stroke ([place] 01/18/2015  . AKI (acute kidney injury) ([place]   . Dysarthria   .  Lethargy   . DM type 2 with diabetic peripheral neuropathy ([place])   . Labile blood pressure   . Hyperlipidemia   . History of CVA with residual deficit   . Chronic obstructive pulmonary disease ([place])   . Hemiparesis, aphasia, and dysphagia as late effect of cerebrovascular accident (CVA) ([place])   . Acute ischemic stroke ([place])   . CVA (cerebral vascular accident) ([place]) 01/14/2015  . CVA (cerebral infarction) 01/14/2015  . Right hemiplegia ([place])   . Right sided weakness 01/13/2015  . Abdominal pain 06/20/2014  . Altered mental status 02/08/2012  . Sleep apnea 12/08/2010  . Morbid obesity ([place]) 12/08/2010  . CHF (congestive heart failure) ([place]) 12/08/2010  . Chronic renal failure 12/08/2010  . TIA (transient ischemic attack) 12/08/2010  . CAD (coronary artery disease) 12/08/2010  . Cellulitis of pubic region 12/01/2010  .  Uncontrolled type 2 DM with hyperosmolar nonketotic hyperglycemia ([place]) 12/01/2010    Mariah Milling , OTR/L  08/14/2015, 10:33 AM  [facility] [address] [place], [place], [address] Phone: [phone]   Fax:  [phone]  Name: Michelle Holmes MRN: [id number] Date of Birth: August 17, 1950

## 2015-08-14 NOTE — Therapy (Signed)
[facility] [address] [place], [place], [address] Phone: [phone]   Fax:  [phone]  Physical Therapy Treatment  Patient Details  Name: Michelle Holmes MRN: [id number] Date of Birth: March 31, 1950 Referring Provider: Alysia Penna, MD  Encounter Date: 08/14/2015      PT End of Session - 08/14/15 1226    Visit Number 23   Number of Visits 25   Date for PT Re-Evaluation 09/20/15   Authorization Type [employer] MCR - G Codes and PN required every 10 visits   PT Start Time 1017   PT Stop Time 1100   PT Time Calculation (min) 43 min   Equipment Utilized During Treatment Gait belt;Other (comment)  standing frame   Activity Tolerance Patient limited by fatigue;Patient limited by pain  Pain in R hip/thigh with standing (when not using standing frame)   Behavior During Therapy Flat affect      Past Medical History  Diagnosis Date  . Emphysema   . Colon cancer ([place])   . Hypertension   . Hypercholesteremia   . Diabetes mellitus   . COPD (chronic obstructive pulmonary disease) ([place])   . Coronary artery disease   . Obesity   . Sleep apnea   . TIA (transient ischemic attack)   . CHF (congestive heart failure) ([place])   . Anemia   . DJD (degenerative joint disease)   . Renal insufficiency   . Chronic back pain   . Scoliosis   . Obesity hypoventilation syndrome ([place])   . Stroke ([place])   . Myocardial infarction [facility])     Past Surgical History  Procedure Laterality Date  . Coronary angioplasty with stent placement    . Colon surgery      Filed Vitals:   08/14/15 1034 08/14/15 1044 08/14/15 1050  BP: 168/85 138/82 148/82  Pulse: 71  74        Subjective Assessment - 08/14/15 1218    Subjective "My nephew found my brace. I'm wearing it today."     Pertinent History PMH significant for: CVA x4, HTN, HLD, CHF, COPD, OSA, CAD, DM II, colon cancer, DJD, scoliosis, chronic renal insufficiency   Patient Stated Goals  "I want a new wheelchair and I want to walk."   Currently in Pain? Yes   Pain Score --  Pt unable to rate; 3/10 on FLACC scale   Pain Location Leg   Pain Orientation Right   Pain Descriptors / Indicators Tightness   Pain Type Acute pain   Pain Onset Other (comment)   Pain Frequency Intermittent   Aggravating Factors  When standing using R PFRW (but not when standing using standing frame today)   Pain Relieving Factors Sitting down; improving alignment   Multiple Pain Sites No                         [facility] Adult PT Treatment/Exercise - 08/14/15 1228    Transfers   Transfers Sit to Stand;Stand to Sit;Squat Pivot Transfers   Sit to Stand 4: Min assist;3: Mod assist   Sit to Stand Details Tactile cues for weight beaing;Verbal cues for precautions/safety;Verbal cues for safe use of DME/AE;Manual facilitation for weight shifting   Sit to Stand Details (indicate cue type and reason) from mat table with R PFRW x3 trials requiring min to mod A   Stand to Sit 4: Min guard;4: Min assist   Stand to Sit Details (indicate cue type and reason) Tactile  cues for weight shifting;Verbal cues for precautions/safety;Tactile cues for weight beaing   Stand Pivot Transfer Details (indicate cue type and reason) Attempted stand pivot transfer from mat table > w/c using R PFRW with pt's personal R PLS AFO; however, pt unable due to R knee buckling.   Squat Pivot Transfers 3: Mod assist   Squat Pivot Transfer Details (indicate cue type and reason) from mat table > w/c with mod A, multimodal cueing for R foot positioning, hand placement, and full anterior weight shift.   Ambulation/Gait   Pre-Gait Activities Pre-gait while standing with BUE support at R PFRW, pt wearing R PLS AFO: static standing x30 seconds with cueing to prevent posterior LOB, min guard for stability; M/L weight shifting x15 reps per direction; progressed to lifting LLE with manual stabilization of R knee (pt with difficulty lifting  LLE due to fear of R knee buckling. Attemped to advance RLE; however, pt unable to do so without significant RLE adduction, external rotation (PT had difficulty manually redirecting/repositioning step). Pt requested to sit at this time.                 PT Education - 08/14/15 1220    Education provided Yes   Education Details Discussed plan to discharge after next visit. Explained impact of positioning/alignment on RLE pain due to spasticity.   Person(s) Educated Patient   Methods Explanation   Comprehension Verbalized understanding          PT Short Term Goals - 07/22/15 1918    PT SHORT TERM GOAL #1   Title STG's = LTG's           PT Long Term Goals - 08/14/15 1227    PT LONG TERM GOAL #1   Title Pt will consistently perform sit <> stand from mat table with min A using LRAD.  (Target date: 08/19/15)   Status On-going   PT LONG TERM GOAL #2   Title Pt will consistently transfer from w/c <> mat table with mod A using LRAD to indicate increased safety with functional transfers.  (08/19/15)   Status On-going   PT LONG TERM GOAL #3   Title Pt will tolerate standing with total A of standing frame for > /= 10 minutes to indicate increased standing tolerance.  (08/19/15)   Baseline 7/19: standing frame x11 minutes with vital signs WNL, no pain in RLE.   Status Achieved   PT LONG TERM GOAL #4   Title Will trial varying types of carbon fiber AFO's during PT sessions to determine if pt appropriate candidate for off-the-shelf AFO.  (08/19/15)   Baseline 7/7: DEFERRED due to pt inability to tolerate wearing R carbon fiber GRAFO's.   Status Deferred               Plan - 08/14/15 1235    Clinical Impression Statement Session focused on static/dynamic standing using R PFRW and personal PLS AFO. Pt initially unable to tolerate standing with R PFRW due to RLE pain (suspect due to spasticity). Attempted to manage spasticity and pain using standing frame, which was not painful.  After use of standing frame x11 minutes, R knee buckling noted. Suspect pt using RLE tone for R knee stability. Will notify referring MD of spasticity limiting function and causing pain. Tentatively plan to DC at next session.    Rehab Potential Good   Clinical Impairments Affecting Rehab Potential cognitive impairments; limited family support; relies on friend for transportation   PT Frequency 2x /  week   PT Duration 4 weeks   PT Treatment/Interventions ADLs/Self Care Home Management;DME Instruction;Vestibular;Gait training;Neuromuscular re-education;Stair training;Functional mobility training;Therapeutic activities;Patient/family education;Therapeutic exercise;Balance training;Prosthetic Training;Orthotic Fit/Training;Wheelchair mobility training   PT Next Visit Plan Check remaining goals and DC.   Recommended Other Services 7/19: Spoke with [facility] rep Josh about power w/c; ppw has been submitted to insurance; awaiting approval.   7/19: sent in-basked to Dr. Letta Pate about increase in RLE spasticity and how this is impacting function and causing pain.   Consulted and Agree with Plan of Care Patient      Patient will benefit from skilled therapeutic intervention in order to improve the following deficits and impairments:  Postural dysfunction, Impaired tone, Abnormal gait, Decreased balance, Decreased activity tolerance, Decreased endurance, Decreased coordination, Decreased mobility, Impaired perceived functional ability, Impaired sensation, Decreased knowledge of use of DME, Decreased strength, Increased edema, Impaired vision/preception, Decreased cognition  Visit Diagnosis: Spastic hemiplegia affecting right dominant side (HCC)  Other abnormalities of gait and mobility     Problem List Patient Active Problem List   Diagnosis Date Noted  . Chronic kidney disease (CKD), stage IV (severe) ([place]) 07/06/2015  . Acute CVA (cerebrovascular accident) ([place])   . Acute encephalopathy 07/04/2015  .  Sinus bradycardia 07/04/2015  . Type 2 diabetes mellitus with vascular disease ([place]) 07/04/2015  . UTI (lower urinary tract infection) 07/04/2015  . Parietal lobe infarction ([place]) 07/04/2015  . Spastic hemiplegia affecting nondominant side ([place]) 07/01/2015  . Obesity, morbid ([place]) 07/01/2015  . Absolute anemia   . Thrombocytopenia ([place])   . Acute ischemic VBA thalamic stroke ([place]) 01/18/2015  . AKI (acute kidney injury) ([place])   . Dysarthria   . Lethargy   . DM type 2 with diabetic peripheral neuropathy ([place])   . Labile blood pressure   . Hyperlipidemia   . History of CVA with residual deficit   . Chronic obstructive pulmonary disease ([place])   . Hemiparesis, aphasia, and dysphagia as late effect of cerebrovascular accident (CVA) ([place])   . Acute ischemic stroke ([place])   . CVA (cerebral vascular accident) ([place]) 01/14/2015  . CVA (cerebral infarction) 01/14/2015  . Right hemiplegia ([place])   . Right sided weakness 01/13/2015  . Abdominal pain 06/20/2014  . Altered mental status 02/08/2012  . Sleep apnea 12/08/2010  . Morbid obesity ([place]) 12/08/2010  . CHF (congestive heart failure) ([place]) 12/08/2010  . Chronic renal failure 12/08/2010  . TIA (transient ischemic attack) 12/08/2010  . CAD (coronary artery disease) 12/08/2010  . Cellulitis of pubic region 12/01/2010  . Uncontrolled type 2 DM with hyperosmolar nonketotic hyperglycemia ([place]) 12/01/2010    Billie Ruddy, PT, DPT [facility] [address] [place], [place], [address] Phone: [phone]   Fax:  [phone] 08/14/2015, 12:42 PM  Name: Michelle Holmes MRN: [id number] Date of Birth: Oct 21, 1950

## 2015-08-16 ENCOUNTER — Ambulatory Visit: Payer: Self-pay | Admitting: Physical Medicine & Rehabilitation

## 2015-08-16 ENCOUNTER — Other Ambulatory Visit: Payer: Self-pay

## 2015-08-16 NOTE — Patient Outreach (Signed)
[facility]) Care Management  08/16/2015  Michelle Holmes Nov 10, 1950 [id number]   REFERRAL SOURCE: EMMI stroke transition program REFERRAL REASON: EMMI stroke program follow up call CONSENT: Patient gave verbal consent to speak with Bobbye Riggs regarding all of her medical/ health information.  Third telephone call to patient for EMMI stroke follow up.  Unable to reach patient or leave voice message.  Message states patients mailbox is full  PLAN: RNCM will send patient outreach letter to attempt contact.   Quinn Plowman RN,BSN,CCM [facility] Telephonic  [phone]

## 2015-08-19 ENCOUNTER — Ambulatory Visit: Payer: 59 | Admitting: Physical Therapy

## 2015-08-19 ENCOUNTER — Ambulatory Visit: Payer: 59 | Admitting: Occupational Therapy

## 2015-08-19 DIAGNOSIS — R2689 Other abnormalities of gait and mobility: Secondary | ICD-10-CM

## 2015-08-19 DIAGNOSIS — G8111 Spastic hemiplegia affecting right dominant side: Secondary | ICD-10-CM | POA: Diagnosis not present

## 2015-08-19 DIAGNOSIS — M79601 Pain in right arm: Secondary | ICD-10-CM | POA: Diagnosis not present

## 2015-08-19 DIAGNOSIS — R293 Abnormal posture: Secondary | ICD-10-CM

## 2015-08-19 DIAGNOSIS — M6281 Muscle weakness (generalized): Secondary | ICD-10-CM

## 2015-08-19 DIAGNOSIS — R208 Other disturbances of skin sensation: Secondary | ICD-10-CM | POA: Diagnosis not present

## 2015-08-19 DIAGNOSIS — R278 Other lack of coordination: Secondary | ICD-10-CM | POA: Diagnosis not present

## 2015-08-19 DIAGNOSIS — I69318 Other symptoms and signs involving cognitive functions following cerebral infarction: Secondary | ICD-10-CM | POA: Diagnosis not present

## 2015-08-19 NOTE — Therapy (Signed)
[facility] [address] [place], [place], [address] Phone: [phone]   Fax:  [phone]  Occupational Therapy Treatment  Patient Details  Name: Michelle Holmes MRN: [id number] Date of Birth: Mar 21, 1950 Referring Provider: Dr. Alysia Penna  Encounter Date: 08/19/2015      OT End of Session - 08/19/15 1652    Visit Number 18   Number of Visits 18   Date for OT Re-Evaluation 09/05/15   Authorization Type UHC Medicare (no visit limit, no auth, G-code needed); medicaid 2nd   Authorization Time Period cert. 05/30/98-09/01/74, re-cert.  1/95/09-04/20/69; re-cert. 07/22/15-09/20/15 due to hospitalizations   Authorization - Visit Number 18   Authorization - Number of Visits 20   OT Start Time 1451   OT Stop Time 1529   OT Time Calculation (min) 38 min   Activity Tolerance Other (comment)  behavior   Behavior During Therapy [place] affect      Past Medical History:  Diagnosis Date  . Anemia   . CHF (congestive heart failure) ([place])   . Chronic back pain   . Colon cancer ([place])   . COPD (chronic obstructive pulmonary disease) ([place])   . Coronary artery disease   . Diabetes mellitus   . DJD (degenerative joint disease)   . Emphysema   . Hypercholesteremia   . Hypertension   . Myocardial infarction ([place])   . Obesity   . Obesity hypoventilation syndrome ([place])   . Renal insufficiency   . Scoliosis   . Sleep apnea   . Stroke ([place])   . TIA (transient ischemic attack)     Past Surgical History:  Procedure Laterality Date  . COLON SURGERY    . CORONARY ANGIOPLASTY WITH STENT PLACEMENT      There were no vitals filed for this visit.      Pt refused to work on transfers today, but stated that she wanted to work on her arm.  PROM:  Performed to RUE in shoulder flex, abduction, ER, elbow ext, supination, wrist ext, finger ext, composite wrist/finger ext within pt tolerance.    Neuro Re-ed: AAROM elbow flex/ext with BUEs  with ball with min-mod facilitation; Reach for the floor stretch with min cueing; Grasp cup and bring toward mouth with min-mod facilitation                               OT Long Term Goals - 08/19/15 1657      OT LONG TERM GOAL #1   Title Pt will perform self PROM HEP with mod prompts/cueing.--due 08/07/15   Status Not Met  Patient has been shown PROM for hand, elbow, forearm - consistently performs ROM on fingers only.      OT LONG TERM GOAL #2   Title Pt will participate in functional activities for at least 36mn with mod prompts.   Status Achieved     OT LONG TERM GOAL #3   Title Pt will tolerate/allow gentle PROM to RUE at least 75% of the time.   Status Achieved     OT LONG TERM GOAL #4   Title [facility]   Status Achieved     OT LONG TERM GOAL #5   Title Patient will don / doff pull over shirt with no greater than mod assist ( due 08/07/15)   Status Achieved     OT LONG TERM GOAL #  6   Title Patient will complete toilet transfer with min assist  (due 08/07/15)   Status Not Met  mod A, pt refused to reassess again today as she already transfered with PT  09/13/15               Plan - 09/13/15 1655    Clinical Impression Statement Pt with limited further progress due to cognition, decr caregiver support during session, and behavior.  Pt now demo improved PROM overall with no pain reported.  Will d/c OT at this time.   Rehab Potential Fair   Clinical Impairments Affecting Rehab Potential no caregiver support (during rehab affecting carryover)   OT Frequency 2x / week   OT Duration Other (comment)   OT Treatment/Interventions Self-care/ADL training;Therapeutic exercise;Functional Mobility Training;Patient/family education;Balance training;Splinting;Manual Therapy;Neuromuscular education;Ultrasound;Energy conservation;Manual lymph drainage;Therapeutic exercises;Therapeutic activities;DME and/or AE  instruction;Parrafin;Cryotherapy;Electrical Stimulation;Fluidtherapy;Cognitive remediation/compensation;Visual/perceptual remediation/compensation;Passive range of motion;Contrast Bath;Moist Heat   Plan discharge OT   OT Home Exercise Plan Education provided:  edema massage, splint wear/care; HEP 07/22/15   Consulted and Agree with Plan of Care Patient      Patient will benefit from skilled therapeutic intervention in order to improve the following deficits and impairments:  Decreased activity tolerance, Decreased balance, Decreased cognition, Decreased coordination, Decreased endurance, Decreased range of motion, Decreased mobility, Decreased knowledge of use of DME, Decreased knowledge of precautions, Decreased strength, Difficulty walking, Increased edema, Impaired perceived functional ability, Impaired flexibility, Obesity, Impaired UE functional use, Impaired tone, Pain, Impaired vision/preception, Improper body mechanics  Visit Diagnosis: Spastic hemiplegia affecting right dominant side (HCC)  Abnormal posture  Muscle weakness (generalized)  Other lack of coordination  Pain In Right Arm      G-Codes - Sep 13, 2015 1659    Functional Assessment Tool Used anticipated/reported mod A for UB dressing and toliet transfer   Functional Limitation Self care   Self Care Goal Status (N8177) At least 60 percent but less than 80 percent impaired, limited or restricted   Self Care Discharge Status [phone]) At least 60 percent but less than 80 percent impaired, limited or restricted      Problem List Patient Active Problem List   Diagnosis Date Noted  . Chronic kidney disease (CKD), stage IV (severe) ([place]) 07/06/2015  . Acute CVA (cerebrovascular accident) ([place])   . Acute encephalopathy 07/04/2015  . Sinus bradycardia 07/04/2015  . Type 2 diabetes mellitus with vascular disease ([place]) 07/04/2015  . UTI (lower urinary tract infection) 07/04/2015  . Parietal lobe infarction ([place]) 07/04/2015  .  Spastic hemiplegia affecting nondominant side ([place]) 07/01/2015  . Obesity, morbid ([place]) 07/01/2015  . Absolute anemia   . Thrombocytopenia ([place])   . Acute ischemic VBA thalamic stroke ([place]) 01/18/2015  . AKI (acute kidney injury) ([place])   . Dysarthria   . Lethargy   . DM type 2 with diabetic peripheral neuropathy ([place])   . Labile blood pressure   . Hyperlipidemia   . History of CVA with residual deficit   . Chronic obstructive pulmonary disease ([place])   . Hemiparesis, aphasia, and dysphagia as late effect of cerebrovascular accident (CVA) ([place])   . Acute ischemic stroke ([place])   . CVA (cerebral vascular accident) ([place]) 01/14/2015  . CVA (cerebral infarction) 01/14/2015  . Right hemiplegia ([place])   . Right sided weakness 01/13/2015  . Abdominal pain 06/20/2014  . Altered mental status 02/08/2012  . Sleep apnea 12/08/2010  . Morbid obesity ([place]) 12/08/2010  . CHF (congestive heart failure) ([place]) 12/08/2010  . Chronic renal  failure 12/08/2010  . TIA (transient ischemic attack) 12/08/2010  . CAD (coronary artery disease) 12/08/2010  . Cellulitis of pubic region 12/01/2010  . Uncontrolled type 2 DM with hyperosmolar nonketotic hyperglycemia ([place]) 12/01/2010   OCCUPATIONAL THERAPY DISCHARGE SUMMARY  Visits from Start of Care: 18  Current functional level related to goals / functional outcomes: See above   Remaining deficits: Significant R hemiplegia with decr ROM, decr strength, unable to use RUE functionally, decr functional mobility, cognitive deficits, R inattention/visuospatial deficits   Education / Equipment: Pt instructed in HEP, splint wear/care (resting hand splint fabricated), strategies/importance of participation in ADLs  Plan: Patient agrees to discharge.  Patient goals were partially met. Patient is being discharged due to                                                     Reaching maximal rehab potential at this time.?????        [facility] 08/19/2015, 5:09  PM  [facility] [address] [place], [place], [address] Phone: [phone]   Fax:  [phone]  Name: Michelle Holmes MRN: [id number] Date of Birth: [birth year]   Vianne Bulls, OTR/L [facility] [address] [place], [place]  [address] [phone] phone [phone] 08/19/15 5:09 PM

## 2015-08-19 NOTE — Therapy (Signed)
[facility] [address] [place], [place], [address] Phone: [phone]   Fax:  [phone]  Physical Therapy Treatment  Patient Details  Name: IQRA ROTUNDO MRN: [id number] Date of Birth: [birth year] Referring Provider: Alysia Penna, MD  Encounter Date: 08/19/2015      PT End of Session - 08/19/15 1704    Visit Number 24   Number of Visits 25   Date for PT Re-Evaluation 09/20/15   Authorization Type UHC MCR - G Codes and PN required every 10 visits   PT Start Time 1404   PT Stop Time 1445   PT Time Calculation (min) 41 min   Activity Tolerance Patient tolerated treatment well   Behavior During Therapy [facility] for tasks assessed/performed      Past Medical History:  Diagnosis Date  . Anemia   . CHF (congestive heart failure) ([place])   . Chronic back pain   . Colon cancer ([place])   . COPD (chronic obstructive pulmonary disease) ([place])   . Coronary artery disease   . Diabetes mellitus   . DJD (degenerative joint disease)   . Emphysema   . Hypercholesteremia   . Hypertension   . Myocardial infarction ([place])   . Obesity   . Obesity hypoventilation syndrome ([place])   . Renal insufficiency   . Scoliosis   . Sleep apnea   . Stroke ([place])   . TIA (transient ischemic attack)     Past Surgical History:  Procedure Laterality Date  . COLON SURGERY    . CORONARY ANGIOPLASTY WITH STENT PLACEMENT      There were no vitals filed for this visit.      Subjective Assessment - 08/19/15 1412    Subjective "I need one of them body brace things (gait belt) so I can walk."   Pertinent History PMH significant for: CVA x4, HTN, HLD, CHF, COPD, OSA, CAD, DM II, colon cancer, DJD, scoliosis, chronic renal insufficiency   Patient Stated Goals "I want a new wheelchair and I want to walk."   Currently in Pain? No/denies                         [facility] Adult PT Treatment/Exercise - 08/19/15 0001      Transfers   Transfers Sit to Stand;Stand to Sit   Sit to Stand 4: Min assist;3: Mod assist   Sit to Stand Details (indicate cue type and reason) blocked practice, initially with mod A; final transfer with min A   Stand to Sit 4: Min assist;3: Mod assist   Lateral/Scoot Transfers 4: Min guard   Lateral/Scoot Transfer Details (indicate cue type and reason) blocked practice from w/c <> mat table; initially with mod A; final transfer with min guard. Noted improved between-session carryover in RLE management prior to transfer.     Ambulation/Gait   Ambulation/Gait No                PT Education - 08/19/15 1700    Education provided Yes   Education Details DC plan.   Person(s) Educated Patient   Methods Explanation   Comprehension Verbalized understanding          PT Short Term Goals - 07/22/15 1918      PT SHORT TERM GOAL #1   Title STG's = LTG's           PT Long Term Goals - 08/19/15 1705      PT LONG TERM GOAL #  1   Title Pt will consistently perform sit <> stand from mat table with min A using LRAD.  (Target date: 09-11-2015)   Baseline 09/11/22: Requires min to mod A.   Status Partially Met     PT LONG TERM GOAL #2   Title Pt will consistently transfer from w/c <> mat table with mod A using LRAD to indicate increased safety with functional transfers.  (Sep 11, 2015)   Baseline Met 2022-09-11.   Status Achieved     PT LONG TERM GOAL #3   Title Pt will tolerate standing with total A of standing frame for > /= 10 minutes to indicate increased standing tolerance.  (Sep 11, 2015)   Baseline 7/19: standing frame x11 minutes with vital signs WNL, no pain in RLE.   Status Achieved     PT LONG TERM GOAL #4   Title Will trial varying types of carbon fiber AFO's during PT sessions to determine if pt appropriate candidate for off-the-shelf AFO.  (09/11/2015)   Baseline 7/7: DEFERRED due to pt inability to tolerate wearing R carbon fiber GRAFO's.   Status Deferred               Plan - 2015/09/11  1706    Clinical Impression Statement Pt has met 2 of 3 applicable LTG's, suggesting increased independence with functional transfers. Pt will therefore be discharged from outpatient PT at this time.    Consulted and Agree with Plan of Care Patient      Patient will benefit from skilled therapeutic intervention in order to improve the following deficits and impairments:     Visit Diagnosis: Spastic hemiplegia affecting right dominant side (HCC)  Other disturbances of skin sensation  Other abnormalities of gait and mobility       G-Codes - 09/11/2015 1708    Functional Assessment Tool Used Requires min to mod A for lateral scooting and sit <> stand transfers   Functional Limitation Changing and maintaining body position   Changing and Maintaining Body Position Goal Status (I6270) At least 20 percent but less than 40 percent impaired, limited or restricted   Changing and Maintaining Body Position Discharge Status (J5009) At least 20 percent but less than 40 percent impaired, limited or restricted      Problem List Patient Active Problem List   Diagnosis Date Noted  . Chronic kidney disease (CKD), stage IV (severe) ([place]) 07/06/2015  . Acute CVA (cerebrovascular accident) ([place])   . Acute encephalopathy 07/04/2015  . Sinus bradycardia 07/04/2015  . Type 2 diabetes mellitus with vascular disease ([place]) 07/04/2015  . UTI (lower urinary tract infection) 07/04/2015  . Parietal lobe infarction ([place]) 07/04/2015  . Spastic hemiplegia affecting nondominant side ([place]) 07/01/2015  . Obesity, morbid ([place]) 07/01/2015  . Absolute anemia   . Thrombocytopenia ([place])   . Acute ischemic VBA thalamic stroke ([place]) 01/18/2015  . AKI (acute kidney injury) ([place])   . Dysarthria   . Lethargy   . DM type 2 with diabetic peripheral neuropathy ([place])   . Labile blood pressure   . Hyperlipidemia   . History of CVA with residual deficit   . Chronic obstructive pulmonary disease ([place])   . Hemiparesis,  aphasia, and dysphagia as late effect of cerebrovascular accident (CVA) ([place])   . Acute ischemic stroke ([place])   . CVA (cerebral vascular accident) ([place]) 01/14/2015  . CVA (cerebral infarction) 01/14/2015  . Right hemiplegia ([place])   . Right sided weakness 01/13/2015  . Abdominal pain 06/20/2014  . Altered mental status  02/08/2012  . Sleep apnea 12/08/2010  . Morbid obesity ([place]) 12/08/2010  . CHF (congestive heart failure) ([place]) 12/08/2010  . Chronic renal failure 12/08/2010  . TIA (transient ischemic attack) 12/08/2010  . CAD (coronary artery disease) 12/08/2010  . Cellulitis of pubic region 12/01/2010  . Uncontrolled type 2 DM with hyperosmolar nonketotic hyperglycemia ([place]) 12/01/2010   PHYSICAL THERAPY DISCHARGE SUMMARY  Visits from Start of Care: 24  Current functional level related to goals / functional outcomes: See above.   Remaining deficits: Spastic R hemiplegia, decreased RLE attention; impaired selective control in RLE, impaired trunk control, decreased muscular endurance and activity tolerance   Education / Equipment: See above. Plan: Patient agrees to discharge.  Patient goals were met. Patient is being discharged due to meeting the stated rehab goals.  ?????        Billie Ruddy, PT, [facility] [address] [place], [place], [address] Phone: [phone]   Fax:  [phone] 08/19/15, 5:08 PM  Name: NYEISHA GOODALL MRN: [id number] Date of Birth: 05-Apr-1950

## 2015-08-20 ENCOUNTER — Ambulatory Visit: Payer: 59 | Admitting: Occupational Therapy

## 2015-08-20 ENCOUNTER — Ambulatory Visit: Payer: 59 | Admitting: Physical Therapy

## 2015-08-22 ENCOUNTER — Encounter: Payer: Self-pay | Admitting: Physical Therapy

## 2015-08-22 ENCOUNTER — Encounter: Payer: Medicare Other | Attending: Registered Nurse

## 2015-08-22 ENCOUNTER — Ambulatory Visit (HOSPITAL_BASED_OUTPATIENT_CLINIC_OR_DEPARTMENT_OTHER): Payer: Medicare Other | Admitting: Physical Medicine & Rehabilitation

## 2015-08-22 ENCOUNTER — Encounter: Payer: Self-pay | Admitting: Physical Medicine & Rehabilitation

## 2015-08-22 VITALS — BP 129/72 | HR 65 | Resp 17

## 2015-08-22 DIAGNOSIS — G8111 Spastic hemiplegia affecting right dominant side: Secondary | ICD-10-CM

## 2015-08-22 DIAGNOSIS — I509 Heart failure, unspecified: Secondary | ICD-10-CM | POA: Diagnosis not present

## 2015-08-22 DIAGNOSIS — I110 Hypertensive heart disease with heart failure: Secondary | ICD-10-CM | POA: Insufficient documentation

## 2015-08-22 DIAGNOSIS — E7800 Pure hypercholesterolemia, unspecified: Secondary | ICD-10-CM | POA: Diagnosis not present

## 2015-08-22 DIAGNOSIS — G8110 Spastic hemiplegia affecting unspecified side: Secondary | ICD-10-CM | POA: Insufficient documentation

## 2015-08-22 DIAGNOSIS — E662 Morbid (severe) obesity with alveolar hypoventilation: Secondary | ICD-10-CM | POA: Diagnosis not present

## 2015-08-22 DIAGNOSIS — G8191 Hemiplegia, unspecified affecting right dominant side: Secondary | ICD-10-CM | POA: Insufficient documentation

## 2015-08-22 DIAGNOSIS — I2510 Atherosclerotic heart disease of native coronary artery without angina pectoris: Secondary | ICD-10-CM | POA: Insufficient documentation

## 2015-08-22 DIAGNOSIS — J449 Chronic obstructive pulmonary disease, unspecified: Secondary | ICD-10-CM | POA: Insufficient documentation

## 2015-08-22 DIAGNOSIS — E119 Type 2 diabetes mellitus without complications: Secondary | ICD-10-CM | POA: Insufficient documentation

## 2015-08-22 NOTE — Therapy (Signed)
[facility] [address] [place], [place], [address] Phone: [phone]   Fax:  [phone]  Patient Details  Name: Michelle Holmes MRN: [id number] Date of Birth: 25-Jun-1950 Referring Provider:  No ref. provider found  Encounter Date: 08/22/2015  Female from a medical supply company (unable to understand name of company or person on phone) contacted this clinic inquiring about medical equipment for patient. Person on phone asked this PT to clarify "a strap to make her (patient) walk better." This therapist conveyed the following: 1) This therapist has not recommended any medical equipment for the patient to walk; 2) there will be no discussion about this patient without written consent from the patient.   Billie Ruddy, PT, DPT [facility] [address] [place], [place], [address] Phone: [phone]   Fax:  [phone] 08/22/15, 12:27 PM

## 2015-08-22 NOTE — Progress Notes (Signed)
Subjective:     Patient ID: Michelle Holmes, female   DOB: [birth year], 65 y.o.   MRN: [id number]  HPI Patient returns today, was last seen approximate 7 months ago during hospitalization for left PCA distribution infarct. Subsequently had a right internal capsule infarct with hospitalization. She has undergone outpatient rehabilitation for 3 months. She is now approximately 7-8 months post disabling stroke. Most recent stroke was minor in comparison.  Her PT and OT have discontinued due to plateau in function. Patient would like to go on with this  Pain Inventory Average Pain 7 Pain Right Now 7 My pain is stabbing  In the last 24 hours, has pain interfered with the following? General activity 5 Relation with others 5 Enjoyment of life 5 What TIME of day is your pain at its worst? morning Sleep (in general) Poor  Pain is worse with: walking and standing Pain improves with: therapy/exercise and medication Relief from Meds: good  Mobility walk with assistance ability to climb steps?  no do you drive?  no use a wheelchair needs help with transfers Do you have any goals in this area?  yes  Function disabled: date disabled NA I need assistance with the following:  dressing, bathing, toileting, meal prep, household duties and shopping  Neuro/Psych trouble walking  Prior Studies Any changes since last visit?  no  Physicians involved in your care Primary care .   Family History  Problem Relation Age of Onset  . Stroke Maternal Aunt    Social History   Social History  . Marital status: Widowed    Spouse name: N/A  . Number of children: N/A  . Years of education: N/A   Social History Main Topics  . Smoking status: Former Research scientist (life sciences)  . Smokeless tobacco: Former Systems developer  . Alcohol use No  . Drug use: No  . Sexual activity: Not Asked   Other Topics Concern  . None   Social History Narrative  . None   Past Surgical History:  Procedure Laterality Date  . COLON SURGERY     . CORONARY ANGIOPLASTY WITH STENT PLACEMENT     Past Medical History:  Diagnosis Date  . Anemia   . CHF (congestive heart failure) ([place])   . Chronic back pain   . Colon cancer ([place])   . COPD (chronic obstructive pulmonary disease) ([place])   . Coronary artery disease   . Diabetes mellitus   . DJD (degenerative joint disease)   . Emphysema   . Hypercholesteremia   . Hypertension   . Myocardial infarction ([place])   . Obesity   . Obesity hypoventilation syndrome ([place])   . Renal insufficiency   . Scoliosis   . Sleep apnea   . Stroke ([place])   . TIA (transient ischemic attack)    There were no vitals taken for this visit.  Opioid Risk Score:   Fall Risk Score:  `1  Depression screen PHQ 2/9  No flowsheet data found.  Review of Systems  Respiratory: Positive for apnea.   All other systems reviewed and are negative.      Objective:   Physical Exam  Constitutional: She is oriented to person, place, and time. She appears well-developed and well-nourished.  HENT:  Head: Normocephalic and atraumatic.  Eyes: Conjunctivae and EOM are normal. Pupils are equal, round, and reactive to light.  Neck: Normal range of motion.  Musculoskeletal:  No pain with range of motion of the right knee. No pain with right shoulder range of motion  wrist or elbow range of motion.  Neurological: She is alert and oriented to person, place, and time. She exhibits abnormal muscle tone. Coordination and gait abnormal.  Psychiatric: She has a normal mood and affect.  Nursing note and vitals reviewed.  Motor strength is 0/5 in the right deltoid, biceps, triceps, grip Ashworth score is Ashworth grade 3 at the finger flexors and wrist flexor the right hand.-With 2 of the elbow flexor. Right lower extremity 3 minus, knee extensor, trace ankle dorsiflexor, plantar flexor. No evidence of clonus at the ankle. 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 No appreciable weakness on the left side as result of the recent right internal capsule infarct    Plan:     Botox injection Right upper extremity FDS, 50 units FDP 50 units FPL 25 units FCR 50 units FCU 25 units  Post injection. Would send for OT. At the current time I do not see any strong indication for PT, given that she has just completed 3 months of outpatient therapy and has plateaued in her level of functioning. I discussed this with both patient as well as family friend

## 2015-08-22 NOTE — Patient Instructions (Signed)
Botox next visit. We'll follow that with some OT. I will contact your PT and discuss further goals

## 2015-08-26 ENCOUNTER — Encounter: Payer: Self-pay | Admitting: Occupational Therapy

## 2015-08-26 ENCOUNTER — Ambulatory Visit: Payer: Self-pay | Admitting: Physical Therapy

## 2015-08-26 ENCOUNTER — Ambulatory Visit (INDEPENDENT_AMBULATORY_CARE_PROVIDER_SITE_OTHER): Payer: Medicare Other | Admitting: Neurology

## 2015-08-26 DIAGNOSIS — G4710 Hypersomnia, unspecified: Secondary | ICD-10-CM

## 2015-08-26 DIAGNOSIS — R0683 Snoring: Secondary | ICD-10-CM

## 2015-08-26 DIAGNOSIS — I63032 Cerebral infarction due to thrombosis of left carotid artery: Secondary | ICD-10-CM

## 2015-08-26 DIAGNOSIS — T733XXA Exhaustion due to excessive exertion, initial encounter: Secondary | ICD-10-CM

## 2015-08-26 DIAGNOSIS — G4730 Sleep apnea, unspecified: Secondary | ICD-10-CM

## 2015-08-29 ENCOUNTER — Ambulatory Visit: Payer: Self-pay | Admitting: Physical Therapy

## 2015-08-29 ENCOUNTER — Encounter: Payer: Self-pay | Admitting: Occupational Therapy

## 2015-08-31 ENCOUNTER — Other Ambulatory Visit: Payer: Self-pay | Admitting: Physical Medicine & Rehabilitation

## 2015-09-02 ENCOUNTER — Encounter: Payer: Self-pay | Admitting: Occupational Therapy

## 2015-09-02 ENCOUNTER — Ambulatory Visit: Payer: Self-pay | Admitting: Physical Therapy

## 2015-09-03 ENCOUNTER — Telehealth: Payer: Self-pay

## 2015-09-03 ENCOUNTER — Other Ambulatory Visit: Payer: Self-pay

## 2015-09-03 DIAGNOSIS — G4733 Obstructive sleep apnea (adult) (pediatric): Secondary | ICD-10-CM

## 2015-09-03 NOTE — Patient Outreach (Signed)
[facility]) Care Management  09/03/2015  Michelle Holmes [birth year] [id number]   Unable to reach patient after 2 attempted telephone calls and letter outreach. PLAN:  RNCM will refer patient to [place] Setting to close due to being unable to reach patient.   Quinn Plowman RN,BSN,CCM [facility] Telephonic  [phone]

## 2015-09-03 NOTE — Telephone Encounter (Signed)
I have tried calling home and cell numbers three times that pt has on file to discuss her sleep study results. The number is invalid. I will send pt a letter asking her to call me.

## 2015-09-05 ENCOUNTER — Ambulatory Visit: Payer: Self-pay | Admitting: Physical Therapy

## 2015-09-05 ENCOUNTER — Encounter: Payer: Self-pay | Admitting: Occupational Therapy

## 2015-09-06 ENCOUNTER — Ambulatory Visit: Payer: Medicare Other | Admitting: Physical Medicine & Rehabilitation

## 2015-09-09 DIAGNOSIS — I252 Old myocardial infarction: Secondary | ICD-10-CM | POA: Diagnosis not present

## 2015-09-09 DIAGNOSIS — I1310 Hypertensive heart and chronic kidney disease without heart failure, with stage 1 through stage 4 chronic kidney disease, or unspecified chronic kidney disease: Secondary | ICD-10-CM | POA: Diagnosis not present

## 2015-09-09 DIAGNOSIS — I639 Cerebral infarction, unspecified: Secondary | ICD-10-CM | POA: Diagnosis not present

## 2015-09-09 DIAGNOSIS — I2510 Atherosclerotic heart disease of native coronary artery without angina pectoris: Secondary | ICD-10-CM | POA: Diagnosis not present

## 2015-09-09 DIAGNOSIS — N189 Chronic kidney disease, unspecified: Secondary | ICD-10-CM | POA: Diagnosis not present

## 2015-09-10 ENCOUNTER — Ambulatory Visit: Payer: Medicare Other | Admitting: Nurse Practitioner

## 2015-09-10 NOTE — Telephone Encounter (Signed)
I called the number that the pt's husband left. The mailbox is full and I cannot leave a VM.  If this pt calls back, PLEASE skype me. She has no working phone numbers and I have already sent her a letter.

## 2015-09-10 NOTE — Telephone Encounter (Signed)
Pt's husband called to discuss sleep study. Phone number has been updated as well. [phone]

## 2015-09-11 ENCOUNTER — Encounter: Payer: Self-pay | Admitting: Nurse Practitioner

## 2015-09-11 NOTE — Telephone Encounter (Signed)
I called pt again. I was able to leave a message asking her to call me back.   If this pt calls back, PLEASE skype me. She is very difficult to get in contact with.

## 2015-09-14 DIAGNOSIS — I509 Heart failure, unspecified: Secondary | ICD-10-CM | POA: Diagnosis not present

## 2015-09-16 ENCOUNTER — Encounter: Payer: Self-pay | Admitting: Physical Medicine & Rehabilitation

## 2015-09-16 ENCOUNTER — Encounter: Payer: Medicare Other | Attending: Registered Nurse

## 2015-09-16 ENCOUNTER — Ambulatory Visit (HOSPITAL_BASED_OUTPATIENT_CLINIC_OR_DEPARTMENT_OTHER): Payer: Medicare Other | Admitting: Physical Medicine & Rehabilitation

## 2015-09-16 VITALS — BP 147/82 | HR 65 | Resp 16

## 2015-09-16 DIAGNOSIS — G8110 Spastic hemiplegia affecting unspecified side: Secondary | ICD-10-CM | POA: Diagnosis not present

## 2015-09-16 DIAGNOSIS — I110 Hypertensive heart disease with heart failure: Secondary | ICD-10-CM | POA: Diagnosis not present

## 2015-09-16 DIAGNOSIS — E119 Type 2 diabetes mellitus without complications: Secondary | ICD-10-CM | POA: Diagnosis not present

## 2015-09-16 DIAGNOSIS — I2510 Atherosclerotic heart disease of native coronary artery without angina pectoris: Secondary | ICD-10-CM | POA: Diagnosis not present

## 2015-09-16 DIAGNOSIS — J449 Chronic obstructive pulmonary disease, unspecified: Secondary | ICD-10-CM | POA: Diagnosis not present

## 2015-09-16 DIAGNOSIS — I509 Heart failure, unspecified: Secondary | ICD-10-CM | POA: Insufficient documentation

## 2015-09-16 DIAGNOSIS — E7800 Pure hypercholesterolemia, unspecified: Secondary | ICD-10-CM | POA: Insufficient documentation

## 2015-09-16 DIAGNOSIS — G8111 Spastic hemiplegia affecting right dominant side: Secondary | ICD-10-CM | POA: Diagnosis not present

## 2015-09-16 DIAGNOSIS — E662 Morbid (severe) obesity with alveolar hypoventilation: Secondary | ICD-10-CM | POA: Diagnosis not present

## 2015-09-16 DIAGNOSIS — G8191 Hemiplegia, unspecified affecting right dominant side: Secondary | ICD-10-CM | POA: Diagnosis not present

## 2015-09-16 NOTE — Telephone Encounter (Addendum)
I spoke to pt's friend, Michelle Holmes, per DPR. I advised him that pt's sleep study revealed sleep apnea and treatment in the form of a cpap is recommended. Alternative therapies will not work for her kind of apnea. A cpap titration is the next step. I advised pt's friend that pt should avoid sedative-hypnotics which may worsen sleep apnea, alcohol and tobacco. I advised pt's friend that pt should lose weight, diet, and exercise if not contraindicated by her other physicians. Pt's friend say says that they will discuss pain management with her PCP. Pt's friend said that pt will be agreeable to a cpap study and that he will relay this information to her. He knows that our sleep lab will call her to schedule the sleep study. Pt's friend verbalized understanding. Pt's friend had no questions at this time but was encouraged to call back if questions arise.

## 2015-09-16 NOTE — Patient Instructions (Signed)

## 2015-09-16 NOTE — Progress Notes (Signed)
Botox Injection for spasticity using needle EMG guidance  Dilution: 50 Units/ml Indication: Severe spasticity which interferes with ADL,mobility and/or  hygiene and is unresponsive to medication management and other conservative care Informed consent was obtained after describing risks and benefits of the procedure with the patient. This includes bleeding, bruising, infection, excessive weakness, or medication side effects. A REMS form is on file and signed. Needle: 25g 38mm needle electrode Number of units per muscle FDS, 50 units FDP 50 units FPL 25 units FCR 50 units FCU 25 units All injections were done after obtaining appropriate EMG activity and after negative drawback for blood. The patient tolerated the procedure well. Post procedure instructions were given. A followup appointment was made.

## 2015-09-16 NOTE — Addendum Note (Signed)
Addended by: Lester [place] A on: 09/16/2015 02:13 PM   Modules accepted: Orders

## 2015-09-19 ENCOUNTER — Encounter: Payer: Self-pay | Admitting: Nurse Practitioner

## 2015-09-19 ENCOUNTER — Ambulatory Visit (INDEPENDENT_AMBULATORY_CARE_PROVIDER_SITE_OTHER): Payer: Medicare Other | Admitting: Nurse Practitioner

## 2015-09-19 VITALS — BP 151/84 | HR 67 | Ht 65.0 in

## 2015-09-19 DIAGNOSIS — M6289 Other specified disorders of muscle: Secondary | ICD-10-CM | POA: Diagnosis not present

## 2015-09-19 DIAGNOSIS — G4730 Sleep apnea, unspecified: Secondary | ICD-10-CM

## 2015-09-19 DIAGNOSIS — G8191 Hemiplegia, unspecified affecting right dominant side: Secondary | ICD-10-CM

## 2015-09-19 DIAGNOSIS — R531 Weakness: Secondary | ICD-10-CM

## 2015-09-19 DIAGNOSIS — N184 Chronic kidney disease, stage 4 (severe): Secondary | ICD-10-CM | POA: Diagnosis not present

## 2015-09-19 DIAGNOSIS — G459 Transient cerebral ischemic attack, unspecified: Secondary | ICD-10-CM

## 2015-09-19 DIAGNOSIS — I6330 Cerebral infarction due to thrombosis of unspecified cerebral artery: Secondary | ICD-10-CM

## 2015-09-19 NOTE — Patient Instructions (Signed)
Stressed the importance of management of risk factors to prevent further stroke Continue Aspirin and Plavix for 3 months then plavix alone for secondary stroke prevention Maintain strict control of hypertension with blood pressure goal below 130/90, today's reading 151/84 continue antihypertensive medications Control of diabetes with hemoglobin A1c below 6.5 followed by primary care most recent hemoglobin A1c7.1 on 07/05/15  continue diabetic medications Cholesterol with LDL cholesterol less than 70, followed by primary care,  Continue Lipitor and Fenofibrate Exercise by doing chair exercises slowly increase , eat healthy diet with whole grains,  fresh fruits and vegetables Discussed risk for recurrent stroke/ TIA and answered additional questions Follow up with me in 3 months Make CPAP appt in the sleep lab on the way out

## 2015-09-19 NOTE — Progress Notes (Signed)
I have read the note, and I agree with the clinical assessment and plan.  WILLIS,CHARLES KEITH   

## 2015-09-19 NOTE — Progress Notes (Signed)
[facility]  PATIENT: Michelle Holmes DOB: November 09, 1950   REASON FOR VISIT: Hospital follow-up for stroke on 07/12/15  HISTORY FROM: Patient and friend Michelle Holmes    HISTORY OF PRESENT ILLNESS:09/19/15 CMPatient presents to the hospital on 07/12/15 with altered mental status, multifactorial, secondary to metabolic encephalopathy in the setting of worsening renal function, but MRI showing acute CVA. MRI brain Punctate 4 mm focus of diffusion abnormality within the posterior limb of the right internal capsule, suspicious for a possible small acute ischemic infarct. 2-D echo and carotid Doppler done with no significant abnormalities.Discussed with the neurology Dr.Xu, this was felt to be secondary to severe atheromatosis disease, patient on aspirin 81 mg oral daily, and Plavix at home, to be discharged on aspirin 325 mg oral daily and Plavix for total of 3 month, then to continue with Plavix only. She returns for follow-up visit today without further stroke or TIA symptoms. She has had sleep study recently and it was determined she has obstructive sleep apnea. She needs to come back to the sleep lab for CPAP titration. Her therapies have stopped because she was not making progress. She returns for reevaluation  HISTORY 6/5/PSMs. Michelle Holmes is a 65 year old African-American lady seen today for first office follow-up visit for in-hospital admission for stroke in December 2016 and TIA in April 2017. She is accompanied by her female friend. She has history of diabetes mellitus, hypertension, hypercholesterolemia, COPD, previous stroke, and coronary artery disease, presenting with exacerbation of weakness involving her right side starting at 10:30 PM last night. Patient has also had abated blood sugar, including a level of 504 in the emergency room today. His been taking aspirin and Plavix daily. CT scan of her head acute changes. Frontal encephalomalacia from previous stroke was noted. NIH stroke score was  7. LSN: 10:30 PM on 01/12/2015 tPA Given: No: Beyond time under for treatment consideration. MRI scan of the brain showed acute subcortical infarcts as well as old infarcts. She has significant spastic right hemiplegia. She has not been any improvement with therapy. She is currently living at home. She is getting outpatient therapy but is unable to get up and walk even with the help of therapist. She's able to move the right leg from the hip  a bit but has no strength in the right hand. She has pain and spasticity even with passive movements in the right hand. She was briefly readmitted in April 2017 with slurred speech and increased weakness. MRI scan of the brain was obtained which I have reviewed and does not show an acute infarct. LDL cholesterol 65 g percent. Hemoglobin 1107.6. MRI of the brain showed no acute occlusion. Echo and Dopplers were not repeated as they were done in December 2016 and unremarkable. She is on aspirin and Plavix and having minor bruising but no bleeding episodes. She does know her and is overweight but has never been evaluated for sleep apnea.  REVIEW OF SYSTEMS: Full 14 system review of systems performed and notable only for those listed, all others are neg:  Constitutional: neg  Cardiovascular: neg Ear/Nose/Throat: neg  Skin: neg Eyes: neg Respiratory: neg Gastroitestinal: neg  Hematology/Lymphatic: neg  Endocrine: neg Musculoskeletal: Gait abnormality Allergy/Immunology: neg Neurological: Hemiparesis Psychiatric: neg Sleep : Obstructive sleep apnea   ALLERGIES: Allergies  Allergen Reactions  . Penicillins Hives and Swelling    Has patient had a PCN reaction causing immediate rash, facial/tongue/throat swelling, SOB or lightheadedness with hypotension: yes Has patient had a PCN reaction causing severe  rash involving mucus membranes or skin necrosis: no Has patient had a PCN reaction that required hospitalization no Has patient had a PCN reaction occurring  within the last 10 years: no If all of the above answers are "NO", then may proceed with Cephalosporin use.     HOME MEDICATIONS: Outpatient Medications Prior to Visit  Medication Sig Dispense Refill  . ADVAIR DISKUS 250-50 MCG/DOSE AEPB Inhale 1 puff into the lungs daily as needed (shortness of breath).     Marland Kitchen amLODipine (NORVASC) 5 MG tablet Take 5 mg by mouth at bedtime.    Marland Kitchen aspirin EC 325 MG tablet Take 1 tablet (325 mg total) by mouth daily. 30 tablet 1  . atorvastatin (LIPITOR) 80 MG tablet Take 1 tablet (80 mg total) by mouth daily at 6 PM. 30 tablet 0  . clopidogrel (PLAVIX) 75 MG tablet Take 1 tablet (75 mg total) by mouth daily. 30 tablet 3  . fenofibrate 54 MG tablet Take 1 tablet (54 mg total) by mouth daily. 30 tablet 1  . ferrous sulfate 325 (65 FE) MG tablet Take 1 tablet (325 mg total) by mouth 2 (two) times daily with a meal. 60 tablet 3  . furosemide (LASIX) 20 MG tablet TK 1 T PO ONCE A DAY UTD  5  . glipiZIDE (GLUCOTROL) 5 MG tablet Take 1 tablet (5 mg total) by mouth daily before breakfast. 30 tablet 1  . Insulin Glargine (LANTUS SOLOSTAR) 100 UNIT/ML Solostar Pen Inject 30 Units into the skin at bedtime.    Marland Kitchen LYRICA 50 MG capsule Take 1 capsule (50 mg total) by mouth daily. 60 capsule 3  . metoprolol succinate (TOPROL-XL) 50 MG 24 hr tablet Take 50 mg by mouth daily. Take with or immediately following a meal.    . Oxycodone HCl 20 MG TABS Please take 10 mg oral 2 times daily, and try to wean .  0  . pantoprazole (PROTONIX) 40 MG tablet Take 40 mg by mouth daily.     No facility-administered medications prior to visit.     PAST MEDICAL HISTORY: Past Medical History:  Diagnosis Date  . Anemia   . CHF (congestive heart failure) ([place])   . Chronic back pain   . COPD (chronic obstructive pulmonary disease) ([place])   . Coronary artery disease   . Diabetes mellitus   . DJD (degenerative joint disease)   . Emphysema   . Hypercholesteremia   . Hypertension   .  Myocardial infarction ([place])   . Obesity   . Obesity hypoventilation syndrome ([place])   . Renal insufficiency   . Scoliosis   . Sleep apnea   . Stroke ([place])   . TIA (transient ischemic attack)     PAST SURGICAL HISTORY: Past Surgical History:  Procedure Laterality Date  . COLON SURGERY    . CORONARY ANGIOPLASTY WITH STENT PLACEMENT      FAMILY HISTORY: Family History  Problem Relation Age of Onset  . Stroke Maternal Aunt     SOCIAL HISTORY: Social History   Social History  . Marital status: Widowed    Spouse name: N/A  . Number of children: N/A  . Years of education: N/A   Occupational History  . Not on file.   Social History Main Topics  . Smoking status: Former Research scientist (life sciences)  . Smokeless tobacco: Former Systems developer  . Alcohol use No  . Drug use: No  . Sexual activity: Not on file   Other Topics Concern  . Not on file  Social History Narrative  . No narrative on file     PHYSICAL EXAM  Vitals:   09/19/15 1458  BP: (!) 151/84  Pulse: 67  Height: 5\' 5"  (1.651 m)   There is no height or weight on file to calculate BMI. General: Obese middle-aged African-American lady, seated, in no evident distress Head: head normocephalic and atraumatic.  Neck: supple with no carotid or supraclavicular bruits Cardiovascular: regular rate and rhythm, no murmurs Musculoskeletal: no deformity. Right foot drop. Fixed flexion contracture of the right wrist and fingers Skin:  no rash/petichiae Vascular:  Normal pulses all extremities  Neurological examination  Mental Status: Awake and fully alert. Oriented to place and time. Recent and remote memory intact. Attention span, concentration and fund of knowledge appropriate. Mood and affect appropriate. Mild dysarthria and occasional word finding difficulty. Cranial Nerves: Fundoscopic exam reveals sharp disc margins. Pupils equal, briskly reactive to light. Extraocular movements full without nystagmus. Visual fields full to confrontation.  Hearing intact. Facial sensation intact. Face, tongue, palate moves normally and symmetrically.  Motor: Spastic right hemiplegia with 0/5 right upper extremity strength and 3/5 in RLE Right foot drop. Sensory.: intact to touch ,pinprick .position and vibratory sensation.  Coordination: Rapid alternating movements normal in all extremities. Finger-to-nose performed accurately bilaterally. Gait and Station: In wheelchair not ambulated Reflexes: 1+ and symmetric. Toes downgoing.  DIAGNOSTIC DATA (LABS, IMAGING, TESTING) - I reviewed patient records, labs, notes, testing and imaging myself where available.  Lab Results  Component Value Date   WBC 7.8 07/12/2015   HGB 10.3 (L) 07/12/2015   HCT 31.7 (L) 07/12/2015   MCV 91.6 07/12/2015   PLT 165 07/12/2015      Component Value Date/Time   NA 141 07/13/2015 1029   K 3.8 07/13/2015 1029   CL 114 (H) 07/13/2015 1029   CO2 22 07/13/2015 1029   GLUCOSE 150 (H) 07/13/2015 1029   BUN 38 (H) 07/13/2015 1029   CREATININE 2.76 (H) 07/13/2015 1029   CALCIUM 8.6 (L) 07/13/2015 1029   PROT 7.0 07/12/2015 0107   ALBUMIN 3.6 07/12/2015 0107   AST 15 07/12/2015 0107   ALT 12 (L) 07/12/2015 0107   ALKPHOS 62 07/12/2015 0107   BILITOT 0.2 (L) 07/12/2015 0107   GFRNONAA 17 (L) 07/13/2015 1029   GFRAA 20 (L) 07/13/2015 1029   Lab Results  Component Value Date   CHOL 106 07/13/2015   HDL 20 (L) 07/13/2015   LDLCALC 48 07/13/2015   TRIG 190 (H) 07/13/2015   CHOLHDL 5.3 07/13/2015   Lab Results  Component Value Date   HGBA1C 7.1 (H) 07/05/2015   Lab Results  Component Value Date   VITAMINB12 190 (L) 10/13/2010   Lab Results  Component Value Date   TSH 1.784 07/04/2015      ASSESSMENT AND Holmes 12 year african [place] lady with 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 Recent left brain TIA in April 2017and another hospitalization June for recurrent stroke. New diagnosis  OSA. The patient is a current patient of Dr. Leonie Man  who is out of the office today . This note is sent to the work in doctor.      Holmes: Stressed the importance of management of risk factors to prevent further stroke Continue Aspirin and Plavix for 3 months then plavix alone for secondary stroke prevention Maintain strict control  of hypertension with blood pressure goal below 130/90, today's reading 151/84 continue antihypertensive medications Control of diabetes with hemoglobin A1c below 6.5 followed by primary care most recent hemoglobin A1c7.1 on 07/05/15  continue diabetic medications Cholesterol with LDL cholesterol less than 70, followed by primary care,  Continue Lipitor and Fenofibrate Exercise by doing chair exercises slowly increase , eat healthy diet with whole grains,  fresh fruits and vegetables Discussed risk for recurrent stroke/ TIA and answered additional questions Follow up with me in 3 months Make CPAP appt in the sleep lab on the way out    Dennie Bible, [facility], [facility], [facility] [address] [place], [place] [address] [phone]

## 2015-09-22 DIAGNOSIS — J449 Chronic obstructive pulmonary disease, unspecified: Secondary | ICD-10-CM | POA: Diagnosis not present

## 2015-09-23 ENCOUNTER — Telehealth: Payer: Self-pay

## 2015-09-23 DIAGNOSIS — G4733 Obstructive sleep apnea (adult) (pediatric): Secondary | ICD-10-CM

## 2015-09-23 NOTE — Telephone Encounter (Signed)
5-12 auto.

## 2015-09-23 NOTE — Telephone Encounter (Signed)
-----   Message from [place] sent at 09/20/2015 12:09 PM EDT ----- Dohmeier said we could auto titrate her since she had a hard time with first study. Vaughan Basta called patient and cancelled titration and explained  she would be hearing from a DME company. Can you send this order.? Thanks

## 2015-09-23 NOTE — Telephone Encounter (Signed)
Would you like auto pap 5- 12 or 15 cm H2O for this pt?

## 2015-09-23 NOTE — Telephone Encounter (Signed)
I called pt to discuss. No answer, left a message asking her to call me back. 

## 2015-09-24 NOTE — Telephone Encounter (Signed)
I spoke to pt's friend, Abagail Kitchens, per [facility]. I advised him that I would send the order for cpap to [facility] and they would call pt in about a week to get the cpap set up. A follow up appt was made with pt's friend. Pt's friend verbalized understanding. Pt's friend had no questions at this time but was encouraged to call back if questions arise.

## 2015-09-24 NOTE — Telephone Encounter (Signed)
I called pt again to discuss. No answer, left a message asking her to call me back. 

## 2015-09-26 ENCOUNTER — Emergency Department (HOSPITAL_COMMUNITY): Payer: Medicare Other

## 2015-09-26 ENCOUNTER — Encounter (HOSPITAL_COMMUNITY): Payer: Self-pay

## 2015-09-26 ENCOUNTER — Emergency Department (HOSPITAL_COMMUNITY)
Admission: EM | Admit: 2015-09-26 | Discharge: 2015-09-26 | Disposition: A | Discharge status: Home / Self Care | Payer: Medicare Other | Attending: Emergency Medicine | Admitting: Emergency Medicine

## 2015-09-26 DIAGNOSIS — Z7984 Long term (current) use of oral hypoglycemic drugs: Secondary | ICD-10-CM | POA: Insufficient documentation

## 2015-09-26 DIAGNOSIS — I252 Old myocardial infarction: Secondary | ICD-10-CM | POA: Diagnosis not present

## 2015-09-26 DIAGNOSIS — E1140 Type 2 diabetes mellitus with diabetic neuropathy, unspecified: Secondary | ICD-10-CM | POA: Insufficient documentation

## 2015-09-26 DIAGNOSIS — Z8673 Personal history of transient ischemic attack (TIA), and cerebral infarction without residual deficits: Secondary | ICD-10-CM | POA: Diagnosis not present

## 2015-09-26 DIAGNOSIS — S0990XA Unspecified injury of head, initial encounter: Secondary | ICD-10-CM

## 2015-09-26 DIAGNOSIS — I130 Hypertensive heart and chronic kidney disease with heart failure and stage 1 through stage 4 chronic kidney disease, or unspecified chronic kidney disease: Secondary | ICD-10-CM | POA: Diagnosis not present

## 2015-09-26 DIAGNOSIS — Z794 Long term (current) use of insulin: Secondary | ICD-10-CM | POA: Insufficient documentation

## 2015-09-26 DIAGNOSIS — Y999 Unspecified external cause status: Secondary | ICD-10-CM | POA: Insufficient documentation

## 2015-09-26 DIAGNOSIS — Y939 Activity, unspecified: Secondary | ICD-10-CM | POA: Insufficient documentation

## 2015-09-26 DIAGNOSIS — Z7982 Long term (current) use of aspirin: Secondary | ICD-10-CM | POA: Diagnosis not present

## 2015-09-26 DIAGNOSIS — I509 Heart failure, unspecified: Secondary | ICD-10-CM | POA: Diagnosis not present

## 2015-09-26 DIAGNOSIS — I5040 Unspecified combined systolic (congestive) and diastolic (congestive) heart failure: Secondary | ICD-10-CM | POA: Diagnosis not present

## 2015-09-26 DIAGNOSIS — Z87891 Personal history of nicotine dependence: Secondary | ICD-10-CM | POA: Diagnosis not present

## 2015-09-26 DIAGNOSIS — J449 Chronic obstructive pulmonary disease, unspecified: Secondary | ICD-10-CM | POA: Insufficient documentation

## 2015-09-26 DIAGNOSIS — Y929 Unspecified place or not applicable: Secondary | ICD-10-CM | POA: Insufficient documentation

## 2015-09-26 DIAGNOSIS — I639 Cerebral infarction, unspecified: Secondary | ICD-10-CM | POA: Diagnosis not present

## 2015-09-26 DIAGNOSIS — N183 Chronic kidney disease, stage 3 (moderate): Secondary | ICD-10-CM | POA: Insufficient documentation

## 2015-09-26 DIAGNOSIS — W050XXA Fall from non-moving wheelchair, initial encounter: Secondary | ICD-10-CM | POA: Insufficient documentation

## 2015-09-26 DIAGNOSIS — I2510 Atherosclerotic heart disease of native coronary artery without angina pectoris: Secondary | ICD-10-CM | POA: Diagnosis not present

## 2015-09-26 DIAGNOSIS — S0003XA Contusion of scalp, initial encounter: Secondary | ICD-10-CM | POA: Diagnosis not present

## 2015-09-26 DIAGNOSIS — G8111 Spastic hemiplegia affecting right dominant side: Secondary | ICD-10-CM | POA: Diagnosis not present

## 2015-09-26 DIAGNOSIS — S0093XA Contusion of unspecified part of head, initial encounter: Secondary | ICD-10-CM | POA: Diagnosis not present

## 2015-09-26 DIAGNOSIS — M150 Primary generalized (osteo)arthritis: Secondary | ICD-10-CM | POA: Diagnosis not present

## 2015-09-26 MED ORDER — HYDROCODONE-ACETAMINOPHEN 5-325 MG PO TABS
1.0000 | ORAL_TABLET | Freq: Once | ORAL | Status: AC
  Administered 2015-09-26: 1 via ORAL
  Filled 2015-09-26: qty 1

## 2015-09-26 MED ORDER — HYDROCODONE-ACETAMINOPHEN 5-325 MG PO TABS: 1.0000 | ORAL_TABLET | ORAL | 0 refills | Status: DC | PRN

## 2015-09-26 MED ORDER — ONDANSETRON 4 MG PO TBDP
4.0000 mg | ORAL_TABLET | Freq: Once | ORAL | Status: AC
  Administered 2015-09-26: 4 mg via ORAL
  Filled 2015-09-26: qty 1

## 2015-09-26 MED ORDER — ONDANSETRON HCL 4 MG PO TABS: 4.0000 mg | ORAL_TABLET | Freq: Four times a day (QID) | ORAL | 0 refills | Status: DC | PRN

## 2015-09-26 NOTE — ED Provider Notes (Signed)
[facility] Provider Note   CSN: [id number] Arrival date & time: 09/26/15  1807     History   Chief Complaint Chief Complaint  Patient presents with  . Fall    thinners    HPI Michelle Holmes is a 65 y.o. female.  The history is provided by the patient and medical records.  Head Injury   The incident occurred less than 1 hour ago. She came to the ER via EMS. The injury mechanism was a fall (Pt just got motorized wheelchair at home. She hit the wrong button for steering and drove her head directly into a dresser, with direct impact to front left forehead. She denies LOC and just has pain and swelling over the impact site.). There was no loss of consciousness. There was no blood loss. The quality of the pain is described as throbbing. The pain is at a severity of 10/10. The pain is severe. The pain has been constant since the injury. Associated symptoms include weakness (baseline for pt from prior CVA). Pertinent negatives include no blurred vision, no vomiting and no disorientation. She was found alert by EMS personnel.    Past Medical History:  Diagnosis Date  . Anemia   . CHF (congestive heart failure) ([place])   . Chronic back pain   . COPD (chronic obstructive pulmonary disease) ([place])   . Coronary artery disease   . Diabetes mellitus   . DJD (degenerative joint disease)   . Emphysema   . Hypercholesteremia   . Hypertension   . Myocardial infarction ([place])   . Obesity   . Obesity hypoventilation syndrome ([place])   . Renal insufficiency   . Scoliosis   . Sleep apnea   . Stroke ([place])   . TIA (transient ischemic attack)     Patient Active Problem List   Diagnosis Date Noted  . Chronic kidney disease (CKD), stage IV (severe) () 07/06/2015  . Acute CVA (cerebrovascular accident) ([place])   . Acute encephalopathy 07/04/2015  . Sinus bradycardia 07/04/2015  . Type 2 diabetes mellitus with vascular disease ([place]) 07/04/2015  . UTI (lower urinary tract infection)  07/04/2015  . Parietal lobe infarction ([place]) 07/04/2015  . Spastic hemiplegia affecting nondominant side ([place]) 07/01/2015  . Obesity, morbid ([place]) 07/01/2015  . Absolute anemia   . Thrombocytopenia ([place])   . Acute ischemic VBA thalamic stroke ([place]) 01/18/2015  . AKI (acute kidney injury) ([place])   . Dysarthria   . Lethargy   . DM type 2 with diabetic peripheral neuropathy ([place])   . Labile blood pressure   . Hyperlipidemia   . History of CVA with residual deficit   . Chronic obstructive pulmonary disease ([place])   . Hemiparesis, aphasia, and dysphagia as late effect of cerebrovascular accident (CVA) ([place])   . Acute ischemic stroke ([place])   . CVA (cerebral vascular accident) ([place]) 01/14/2015  . CVA (cerebral infarction) 01/14/2015  . Right hemiplegia ([place])   . Right sided weakness 01/13/2015  . Abdominal pain 06/20/2014  . Altered mental status 02/08/2012  . Sleep apnea 12/08/2010  . Morbid obesity ([place]) 12/08/2010  . CHF (congestive heart failure) ([place]) 12/08/2010  . Chronic renal failure 12/08/2010  . TIA (transient ischemic attack) 12/08/2010  . CAD (coronary artery disease) 12/08/2010  . Cellulitis of pubic region 12/01/2010  . Uncontrolled type 2 DM with hyperosmolar nonketotic hyperglycemia ([place]) 12/01/2010    Past Surgical History:  Procedure Laterality Date  . COLON SURGERY    . CORONARY ANGIOPLASTY WITH STENT  PLACEMENT      OB History    No data available       Home Medications    Prior to Admission medications   Medication Sig Start Date End Date Taking? Authorizing Provider  ADVAIR DISKUS 250-50 MCG/DOSE AEPB Inhale 1 puff into the lungs daily as needed (shortness of breath).  08/22/14  Yes Historical Provider, MD  amLODipine (NORVASC) 5 MG tablet Take 5 mg by mouth at bedtime.   Yes Historical Provider, MD  aspirin EC 325 MG tablet Take 1 tablet (325 mg total) by mouth daily. 07/14/15  Yes Albertine Patricia, MD  atorvastatin (LIPITOR) 80 MG tablet Take 1 tablet  (80 mg total) by mouth daily at 6 PM. 07/07/15  Yes Mauricio Gerome Apley, MD  clopidogrel (PLAVIX) 75 MG tablet Take 1 tablet (75 mg total) by mouth daily. 02/07/15  Yes Daniel J Angiulli, PA-C  fenofibrate 54 MG tablet Take 1 tablet (54 mg total) by mouth daily. 02/07/15  Yes Daniel J Angiulli, PA-C  ferrous sulfate 325 (65 FE) MG tablet Take 1 tablet (325 mg total) by mouth 2 (two) times daily with a meal. 02/07/15  Yes Daniel J Angiulli, PA-C  furosemide (LASIX) 20 MG tablet Take one tablet by mouth daily 07/22/15  Yes Historical Provider, MD  glipiZIDE (GLUCOTROL) 5 MG tablet Take 1 tablet (5 mg total) by mouth daily before breakfast. 02/07/15  Yes Daniel J Angiulli, PA-C  Insulin Glargine (LANTUS SOLOSTAR) 100 UNIT/ML Solostar Pen Inject 30 Units into the skin at bedtime.   Yes Historical Provider, MD  LYRICA 50 MG capsule Take 1 capsule (50 mg total) by mouth daily. 07/14/15  Yes Albertine Patricia, MD  metoprolol succinate (TOPROL-XL) 50 MG 24 hr tablet Take 50 mg by mouth daily. Take with or immediately following a meal.   Yes Historical Provider, MD  Oxycodone HCl 20 MG TABS Please take 10 mg oral 2 times daily, and try to wean . 07/14/15  Yes Silver Huguenin Elgergawy, MD  pantoprazole (PROTONIX) 40 MG tablet Take 40 mg by mouth daily.   Yes Historical Provider, MD  HYDROcodone-acetaminophen (NORCO/VICODIN) 5-325 MG tablet Take 1 tablet by mouth every 4 (four) hours as needed for severe pain. 09/26/15   [name], MD  ondansetron (ZOFRAN) 4 MG tablet Take 1 tablet (4 mg total) by mouth every 6 (six) hours as needed for nausea or vomiting. 09/26/15   [name], MD    Family History Family History  Problem Relation Age of Onset  . Stroke Maternal Aunt     Social History Social History  Substance Use Topics  . Smoking status: Former Research scientist (life sciences)  . Smokeless tobacco: Former Systems developer  . Alcohol use No     Allergies   Penicillins   Review of Systems Review of Systems  Constitutional: Negative  for fever.  HENT: Positive for facial swelling.   Eyes: Negative for blurred vision and visual disturbance.  Respiratory: Negative for shortness of breath.   Cardiovascular: Negative for chest pain.  Gastrointestinal: Negative for abdominal pain and vomiting.  Musculoskeletal: Negative for neck pain.  Skin: Positive for wound.  Neurological: Positive for speech difficulty (baseline from old CVA no acute changes), weakness (baseline for pt from prior CVA) and headaches.  Hematological: Bruises/bleeds easily (plavix).  Psychiatric/Behavioral: Negative for confusion.  All other systems reviewed and are negative.   Physical Exam Updated Vital Signs BP 157/80   Pulse 92   Temp 98.1 F (36.7 C) (Oral)  Resp 21   Ht 5\' 6"  (1.676 m)   Wt 102.5 kg   SpO2 100%   BMI 36.48 kg/m   Physical Exam  Constitutional: She is oriented to person, place, and time. She appears well-developed and well-nourished. No distress.  HENT:  Head: Normocephalic.  Large left frontal hematoma, TTP, no abrasion or bleeding. No skull depressions or abrasions. No battles sign, facial abnormalities   Eyes: Conjunctivae are normal. Pupils are equal, round, and reactive to light.  Neck: Normal range of motion. Neck supple.  No midline C spine TTP   Cardiovascular: Normal rate and regular rhythm.   Pulmonary/Chest: Effort normal and breath sounds normal. No respiratory distress.  Abdominal: Soft. There is no tenderness.  Musculoskeletal: She exhibits no edema.  Neurological: She is alert and oriented to person, place, and time. A cranial nerve deficit (facial asymmetry and slight slurring of words that pt states is baseline) is present. She exhibits abnormal muscle tone.  Weakness and contracted RUE and RLE that pt reports is from prior CVA and not acute or changed recently  Skin: Skin is warm and dry.  Psychiatric: She has a normal mood and affect.  Nursing note and vitals reviewed.    ED Treatments / Results   Labs (all labs ordered are listed, but only abnormal results are displayed) Labs Reviewed - No data to display  EKG  EKG Interpretation None       Radiology Ct Head Wo Contrast  Result Date: 09/26/2015 CLINICAL DATA:  Patient fell and hit her head. Patient is on blood thinners. Large hematoma to forehead. EXAM: CT HEAD WITHOUT CONTRAST TECHNIQUE: Contiguous axial images were obtained from the base of the skull through the vertex without intravenous contrast. COMPARISON:  Brain MRI and head CT, 07/12/2015 FINDINGS: Brain: The ventricles are normal in size and configuration. There are no parenchymal masses or mass effect. Hypoattenuation and mild encephalomalacia involving the anterolateral left frontal lobe is stable consistent with an old infarct. Other patchy areas of white matter hypoattenuation are noted consistent with mild to moderate chronic microvascular ischemic change. There is no evidence of a recent cortical infarct. There are no extra-axial masses or abnormal fluid collections. There is no intracranial hemorrhage. Vascular: No hyperdense vessel or unexpected calcification. Skull: No skull fracture Sinuses/Orbits: Globes orbits are unremarkable. Mild mucosal thickening lines the left maxillary sinus and a posterior left ethmoid air cell. Sinuses otherwise clear. Clear mastoid air cells. Other: Large left frontal scalp hematoma. IMPRESSION: 1. Large left frontal scalp hematoma, but no underlying fracture. 2. No acute intracranial abnormalities. Old left frontal infarct. Mild to moderate chronic microvascular ischemic change. Electronically Signed   By: Lajean Manes M.D.   On: 09/26/2015 18:44    Procedures Procedures (including critical care time)  Medications Ordered in ED Medications  HYDROcodone-acetaminophen (NORCO/VICODIN) 5-325 MG per tablet 1 tablet (1 tablet Oral Given 09/26/15 1955)  ondansetron (ZOFRAN-ODT) disintegrating tablet 4 mg (4 mg Oral Given 09/26/15 1956)      Initial Impression / Assessment and Plan / ED Course  I have reviewed the triage vital signs and the nursing notes.  Pertinent labs & imaging results that were available during my care of the patient were reviewed by me and considered in my medical decision making (see chart for details).  Clinical Course    65 year old female with numerous medical problems including CAD/MI, COPD, CHF, diabetes, prior CVA with residual right-sided deficits presenting with mechanical fall and resulting head injury, as above. No LOC.  Stable vital signs. Neurologically she is at baseline with no new deficits, only residual deficits from prior CVA. Do not feel she needs imaging of her C-spine per can[place] C spine rule. CT head with evidence of large hematoma, but no intracranial injury and no underlying fracture. Given Norco and ice for control of pain. Some vomiting after Norco, improved with Zofran. Discharged home with return precautions, symptomatic control at home.  Case discussed with Dr. Vanita Panda, who oversaw management of this patient.   Final Clinical Impressions(s) / ED Diagnoses   Final diagnoses:  Minor head injury, initial encounter  Scalp hematoma, initial encounter    New Prescriptions New Prescriptions   HYDROCODONE-ACETAMINOPHEN (NORCO/VICODIN) 5-325 MG TABLET    Take 1 tablet by mouth every 4 (four) hours as needed for severe pain.   ONDANSETRON (ZOFRAN) 4 MG TABLET    Take 1 tablet (4 mg total) by mouth every 6 (six) hours as needed for nausea or vomiting.     [name], MD 09/26/15 [id number]    Carmin Muskrat, MD 09/26/15 [phone]

## 2015-09-26 NOTE — ED Triage Notes (Signed)
Pt. Coming from home via [facility] for fall on blood thinners. Pt got new powered wheel chair and pushed the wrong button today. Pt. Hit her head on the wall. Pt. Does take blood thinners for hx of stroke. Pt. Does have R-sided def. From that stroke. Pt. Noted to have very large hematoma to left forehead. Pt. Aox4. Pt. Denies any nausea, neck/back pain. Pupils equal and reactive at this time.

## 2015-09-26 NOTE — ED Notes (Signed)
EDP at bedside  

## 2015-10-04 ENCOUNTER — Ambulatory Visit: Payer: Medicare Other | Admitting: Physical Medicine & Rehabilitation

## 2015-10-04 ENCOUNTER — Encounter: Payer: Medicare Other | Attending: Registered Nurse

## 2015-10-04 DIAGNOSIS — I110 Hypertensive heart disease with heart failure: Secondary | ICD-10-CM | POA: Insufficient documentation

## 2015-10-04 DIAGNOSIS — J449 Chronic obstructive pulmonary disease, unspecified: Secondary | ICD-10-CM | POA: Insufficient documentation

## 2015-10-04 DIAGNOSIS — G8191 Hemiplegia, unspecified affecting right dominant side: Secondary | ICD-10-CM | POA: Insufficient documentation

## 2015-10-04 DIAGNOSIS — E7800 Pure hypercholesterolemia, unspecified: Secondary | ICD-10-CM | POA: Insufficient documentation

## 2015-10-04 DIAGNOSIS — I2510 Atherosclerotic heart disease of native coronary artery without angina pectoris: Secondary | ICD-10-CM | POA: Insufficient documentation

## 2015-10-04 DIAGNOSIS — I509 Heart failure, unspecified: Secondary | ICD-10-CM | POA: Insufficient documentation

## 2015-10-04 DIAGNOSIS — E119 Type 2 diabetes mellitus without complications: Secondary | ICD-10-CM | POA: Insufficient documentation

## 2015-10-04 DIAGNOSIS — E662 Morbid (severe) obesity with alveolar hypoventilation: Secondary | ICD-10-CM | POA: Insufficient documentation

## 2015-10-04 DIAGNOSIS — G8110 Spastic hemiplegia affecting unspecified side: Secondary | ICD-10-CM | POA: Insufficient documentation

## 2015-10-07 ENCOUNTER — Telehealth: Payer: Self-pay | Admitting: *Deleted

## 2015-10-07 DIAGNOSIS — G459 Transient cerebral ischemic attack, unspecified: Secondary | ICD-10-CM

## 2015-10-07 NOTE — Telephone Encounter (Signed)
Pt requesting [facility]PT.  [facility].

## 2015-10-07 NOTE — Telephone Encounter (Signed)
Ok to make referral but my recent note says therapies concluded due to lack of progress.

## 2015-10-08 NOTE — Telephone Encounter (Signed)
Order for [facility] placed.

## 2015-10-09 ENCOUNTER — Emergency Department (HOSPITAL_COMMUNITY)
Admission: EM | Admit: 2015-10-09 | Discharge: 2015-10-09 | Disposition: A | Discharge status: Home / Self Care | Payer: Medicare Other | Attending: Emergency Medicine | Admitting: Emergency Medicine

## 2015-10-09 ENCOUNTER — Encounter (HOSPITAL_COMMUNITY): Payer: Self-pay | Admitting: *Deleted

## 2015-10-09 DIAGNOSIS — R220 Localized swelling, mass and lump, head: Secondary | ICD-10-CM | POA: Insufficient documentation

## 2015-10-09 DIAGNOSIS — I509 Heart failure, unspecified: Secondary | ICD-10-CM | POA: Insufficient documentation

## 2015-10-09 DIAGNOSIS — I252 Old myocardial infarction: Secondary | ICD-10-CM | POA: Insufficient documentation

## 2015-10-09 DIAGNOSIS — Z5321 Procedure and treatment not carried out due to patient leaving prior to being seen by health care provider: Secondary | ICD-10-CM | POA: Diagnosis not present

## 2015-10-09 DIAGNOSIS — J449 Chronic obstructive pulmonary disease, unspecified: Secondary | ICD-10-CM | POA: Diagnosis not present

## 2015-10-09 DIAGNOSIS — I110 Hypertensive heart disease with heart failure: Secondary | ICD-10-CM | POA: Insufficient documentation

## 2015-10-09 DIAGNOSIS — Z8673 Personal history of transient ischemic attack (TIA), and cerebral infarction without residual deficits: Secondary | ICD-10-CM | POA: Diagnosis not present

## 2015-10-09 DIAGNOSIS — Z87891 Personal history of nicotine dependence: Secondary | ICD-10-CM | POA: Insufficient documentation

## 2015-10-09 DIAGNOSIS — I2510 Atherosclerotic heart disease of native coronary artery without angina pectoris: Secondary | ICD-10-CM | POA: Insufficient documentation

## 2015-10-09 DIAGNOSIS — E119 Type 2 diabetes mellitus without complications: Secondary | ICD-10-CM | POA: Insufficient documentation

## 2015-10-09 NOTE — ED Notes (Signed)
Pt called x3 in the waiting and sub-waiting room with no answer.

## 2015-10-09 NOTE — ED Triage Notes (Signed)
Pt here for recommended follow up 2 weeks after fall.  Area of swelling has reportedly decreased around eyes but area on forehead remains the same, feels fluid filled.  Pt has been feeling well

## 2015-10-14 DIAGNOSIS — G459 Transient cerebral ischemic attack, unspecified: Secondary | ICD-10-CM | POA: Diagnosis not present

## 2015-10-14 DIAGNOSIS — G8191 Hemiplegia, unspecified affecting right dominant side: Secondary | ICD-10-CM | POA: Diagnosis not present

## 2015-10-14 DIAGNOSIS — E119 Type 2 diabetes mellitus without complications: Secondary | ICD-10-CM | POA: Diagnosis not present

## 2015-10-14 DIAGNOSIS — G4730 Sleep apnea, unspecified: Secondary | ICD-10-CM | POA: Diagnosis not present

## 2015-10-14 DIAGNOSIS — I6330 Cerebral infarction due to thrombosis of unspecified cerebral artery: Secondary | ICD-10-CM | POA: Diagnosis not present

## 2015-10-15 DIAGNOSIS — M545 Low back pain: Secondary | ICD-10-CM | POA: Diagnosis not present

## 2015-10-15 DIAGNOSIS — I6330 Cerebral infarction due to thrombosis of unspecified cerebral artery: Secondary | ICD-10-CM | POA: Diagnosis not present

## 2015-10-15 DIAGNOSIS — I509 Heart failure, unspecified: Secondary | ICD-10-CM | POA: Diagnosis not present

## 2015-10-15 DIAGNOSIS — G4730 Sleep apnea, unspecified: Secondary | ICD-10-CM | POA: Diagnosis not present

## 2015-10-15 DIAGNOSIS — G8191 Hemiplegia, unspecified affecting right dominant side: Secondary | ICD-10-CM | POA: Diagnosis not present

## 2015-10-15 DIAGNOSIS — R531 Weakness: Secondary | ICD-10-CM | POA: Diagnosis not present

## 2015-10-15 DIAGNOSIS — S233XXS Sprain of ligaments of thoracic spine, sequela: Secondary | ICD-10-CM | POA: Diagnosis not present

## 2015-10-15 DIAGNOSIS — S0003XS Contusion of scalp, sequela: Secondary | ICD-10-CM | POA: Diagnosis not present

## 2015-10-15 DIAGNOSIS — G459 Transient cerebral ischemic attack, unspecified: Secondary | ICD-10-CM | POA: Diagnosis not present

## 2015-10-15 DIAGNOSIS — E119 Type 2 diabetes mellitus without complications: Secondary | ICD-10-CM | POA: Diagnosis not present

## 2015-10-15 NOTE — Telephone Encounter (Signed)
Noted this has been sent to [facility]. . Thanks Hinton Dyer

## 2015-10-16 DIAGNOSIS — G8191 Hemiplegia, unspecified affecting right dominant side: Secondary | ICD-10-CM | POA: Diagnosis not present

## 2015-10-16 DIAGNOSIS — I6330 Cerebral infarction due to thrombosis of unspecified cerebral artery: Secondary | ICD-10-CM | POA: Diagnosis not present

## 2015-10-16 DIAGNOSIS — G4730 Sleep apnea, unspecified: Secondary | ICD-10-CM | POA: Diagnosis not present

## 2015-10-16 DIAGNOSIS — G459 Transient cerebral ischemic attack, unspecified: Secondary | ICD-10-CM | POA: Diagnosis not present

## 2015-10-16 DIAGNOSIS — E119 Type 2 diabetes mellitus without complications: Secondary | ICD-10-CM | POA: Diagnosis not present

## 2015-10-17 DIAGNOSIS — G4733 Obstructive sleep apnea (adult) (pediatric): Secondary | ICD-10-CM | POA: Diagnosis not present

## 2015-10-18 DIAGNOSIS — G8191 Hemiplegia, unspecified affecting right dominant side: Secondary | ICD-10-CM | POA: Diagnosis not present

## 2015-10-18 DIAGNOSIS — I6330 Cerebral infarction due to thrombosis of unspecified cerebral artery: Secondary | ICD-10-CM | POA: Diagnosis not present

## 2015-10-18 DIAGNOSIS — G4730 Sleep apnea, unspecified: Secondary | ICD-10-CM | POA: Diagnosis not present

## 2015-10-18 DIAGNOSIS — E119 Type 2 diabetes mellitus without complications: Secondary | ICD-10-CM | POA: Diagnosis not present

## 2015-10-18 DIAGNOSIS — G459 Transient cerebral ischemic attack, unspecified: Secondary | ICD-10-CM | POA: Diagnosis not present

## 2015-10-21 ENCOUNTER — Encounter: Payer: Self-pay | Admitting: *Deleted

## 2015-10-21 ENCOUNTER — Ambulatory Visit: Payer: 59 | Attending: Physical Medicine & Rehabilitation | Admitting: *Deleted

## 2015-10-21 DIAGNOSIS — R278 Other lack of coordination: Secondary | ICD-10-CM | POA: Diagnosis not present

## 2015-10-21 DIAGNOSIS — G459 Transient cerebral ischemic attack, unspecified: Secondary | ICD-10-CM | POA: Diagnosis not present

## 2015-10-21 DIAGNOSIS — G8111 Spastic hemiplegia affecting right dominant side: Secondary | ICD-10-CM | POA: Insufficient documentation

## 2015-10-21 DIAGNOSIS — E119 Type 2 diabetes mellitus without complications: Secondary | ICD-10-CM | POA: Diagnosis not present

## 2015-10-21 DIAGNOSIS — G8191 Hemiplegia, unspecified affecting right dominant side: Secondary | ICD-10-CM | POA: Diagnosis not present

## 2015-10-21 DIAGNOSIS — M6281 Muscle weakness (generalized): Secondary | ICD-10-CM

## 2015-10-21 DIAGNOSIS — I6330 Cerebral infarction due to thrombosis of unspecified cerebral artery: Secondary | ICD-10-CM | POA: Diagnosis not present

## 2015-10-21 DIAGNOSIS — G4730 Sleep apnea, unspecified: Secondary | ICD-10-CM | POA: Diagnosis not present

## 2015-10-21 NOTE — Therapy (Signed)
[facility] [address] [place], [place], [address] Phone: [phone]   Fax:  [phone]  Occupational Therapy Evaluation  Patient Details  Name: Michelle Holmes MRN: [id number] Date of Birth: December 05, 1950 Referring Provider: Dr Alysia Penna  Encounter Date: 10/21/2015      OT End of Session - 10/21/15 1213    Visit Number 1   Number of Visits 4-5 visits   Date for OT Re-Evaluation 11/20/15   Authorization Type UHC Medicare (no visit limit, no authorization, G code needed)   Authorization - Visit Number 1   OT Start Time 1100   OT Stop Time 1142   OT Time Calculation (min) 42 min   Activity Tolerance Other (comment)  Limited by behavior; declining aspects of assessment   Behavior During Therapy --  See above      Past Medical History:  Diagnosis Date  . Anemia   . CHF (congestive heart failure) ([place])   . Chronic back pain   . COPD (chronic obstructive pulmonary disease) ([place])   . Coronary artery disease   . Diabetes mellitus   . DJD (degenerative joint disease)   . Emphysema   . Hypercholesteremia   . Hypertension   . Myocardial infarction ([place])   . Obesity   . Obesity hypoventilation syndrome ([place])   . Renal insufficiency   . Scoliosis   . Sleep apnea   . Stroke ([place])   . TIA (transient ischemic attack)     Past Surgical History:  Procedure Laterality Date  . COLON SURGERY    . CORONARY ANGIOPLASTY WITH STENT PLACEMENT      There were no vitals filed for this visit.      Subjective Assessment - 10/21/15 1144    Subjective  "Why am I here today" "What for?" "So did I just come here today for you to ask me questions?" Pt is s/p botox injection RUE on 09/16/15 and presents to Out-pt Neuro Rehab for OT, eval and treatment per Dr Letta Pate.   Pertinent History CVA 01/13/15, hx of previous CVA with risidual R-sided hemiplegia, hospitalization 05/05/15 with possible TIA or seizure disorder; also hx  of HTN, MI, CHF, CAD, DM, COPD, emphysema, sleep apnea, chronic back pain, scoliosis, DJD, renal insufficiency, anemia, and colon cancer. See EPIC history as well, for PMH.   Limitations cognitive deficits, inconsistent participation   Patient Stated Goals "I want to reuse this hand" re:RUE           [facility] OT Assessment - 10/21/15 0001      Assessment   Diagnosis Spastic Hemiplegia affecting RUE   Referring Provider Dr Alysia Penna   Onset Date 01/13/15  Botox injection 09/16/15, RUE   Prior Therapy hospitalized 01/13/15-02/07/15, 4/9/-05/07/15 due to possible TIA or seizure. Pt denies further hospitializations, but does report ED visits w/o admission - see EPIC for full history.     Precautions   Precautions Fall   Required Braces or Orthoses Other Brace/Splint   Other Brace/Splint Has AFO but does not wear only "sometimes"     Balance Screen   Has the patient fallen in the past 6 months No     Home  Environment   Family/patient expects to be discharged to: Private residence   Living Arrangements Other relatives  Pt reports that her cousin lives with her   Type of [place] One level   Additional Comments No family present during eval   Lives With  Family  Pt reports that someone is with her at all times     Prior Function   Level of Independence Needs assistance with ADLs;Needs assistance with homemaking;Needs assistance with transfers  Pt requires assistance with all ADL, does some basic ADL's w   Comments Pt reports that family prepares all meals, assists with ADL's and meal prep and all functional activity     ADL   Transfers/Ambulation Related to ADL's Pt declines transfer today, states that family assists her PRN for all transfers, then states "I stay in this wheelchair"   ADL comments Unable to fully assess ADLs due to decreased participation, inconsistent reports/answers from pt for following commands and functional activities. Anticipate mod-max A,  but unsure based on decr participation in evaluation today and no caregiver available per pt report "Am I only here to answer your questions?"  Pt refused assistance to reposition herself in her wheelchair w/ and w/o assist as offered. Pt also declined SPT to mat table as well for assessment.     IADL   Prior Level of Function Light Housekeeping --  Pt reports assist for all IADL's from family/friends     Mobility   Mobility Status Comments W/C bound per pt     Written Expression   Dominant Hand Right     Vision Assessment   Vision Assessment Vision not tested     Cognition   Area of Impairment Attention;Memory;Following commands   Memory Decreased short-term memory   Memory Comments Pt with inconsitancies with  responses throughout evaluation. No family present   Awareness Intellectual;Emergent   Memory Impaired   Memory Impairment Decreased short term memory   Decreased Short Term Memory Functional basic   Problem Solving Impaired   Executive Function Decision Making;Initiating   Decision Making Impaired   Initiating Impaired   Behaviors Verbal agitation;Poor frustration tolerance;Lability     Sensation   Light Touch Appears Intact  R, however, pt w/ inconsistent answers during assess @ times   Light Touch Impaired Details --  RUE light touch appears intact/difficult to assess     Coordination   Coordination No active movement RUE     Perception   Perception Impaired   Inattention/Neglect Impaired - to be further tested in functual context     Tone   Assessment Location Right Upper Extremity     ROM / Strength   AROM / PROM / Strength AROM;PROM     AROM   Overall AROM  Deficits   Overall AROM Comments No RUE AROM; Pt w/ impaired LUE AROM but functional for ADL's     PROM   Overall PROM  Deficits   Overall PROM Comments PROM limited to pain w/ slow gentle movement RUE. while seated in w/c (pt refused transfer to mat table)   PROM Assessment Site  Shoulder;Elbow;Forearm;Wrist;Finger;Thumb  Shoulder ~25%; elbow ~75%; Fingers ~80%; Wrist/forearm ~20%   Right/Left Shoulder Right     Hand Function   Right Hand Gross Grasp Impaired   Comment 0% active movement/No AROM/dense hemiplegia with moderate tone RUE     RUE Tone   RUE Tone Moderate                         OT Education - 10/21/15 1211    Education provided Yes   Education Details Recommend 4-5 visits for out-pt OT for pt/family education for home program for RUE PROM to assist with daily activities/ADL's. Pt was instrcuted to bring  family member to next few visits for instruction/HEP.   Person(s) Educated Patient   Methods Explanation;Verbal cues;Demonstration   Comprehension Verbalized understanding          OT Short Term Goals - 10/21/15 1233      OT SHORT TERM GOAL #1   Title STG's = LTG's           OT Long Term Goals - 10/21/15 1234      OT LONG TERM GOAL #1   Title Pt will perform self PROM HEP with mod prompts/cueing. - due 11/18/15   Time 4   Period Weeks   Status New     OT LONG TERM GOAL #2   Title Patients family will be Mod I in PROM RUE as evedenced by Mod demonstration and follow through w/o vc's.   Time 4   Period Weeks   Status New     OT LONG TERM GOAL #3   Title Pt will tolerate functional transfer from w/c to mat table for PROM RUE w/ Mod A   Time 4   Period Weeks   Status New     OT LONG TERM GOAL #4   Title Pt/family will report increased PROM RUE for ADL's as seen by decreased pain of 2/10 or less w/ ADL's related to bathing/dressing upper body.   Time 4   Period Weeks   Status New     OT LONG TERM GOAL #5   Title Patient will don / doff pull over shirt with no greater than mod assist ( due 11/18/15)   Time 4   Period Weeks   Status New               Plan - 10/21/15 1250    Clinical Impression Statement Pt was assesed for out-pt OT evaluation today following RUE botox injection on 09/16/15 per Dr  Letta Pate. She presents w/ RUE hemiplegia, No AROM and moderate spasticity following CVA in December 2016. Pt has had out-pt therapy before and may benefit from pt/family education& instruction in home program for PROM RUE w/ family support/assistance to assist with ADL's related to bathing, dressing, pain and decreased burden of care. Pt was agreeable to having family participate in therapy sessions with this goal in mind.   Rehab Potential Fair   Clinical Impairments Affecting Rehab Potential no caregiver support (during previous out-pt therapy sessions affecting carryover at home)   OT Frequency 1x / week   OT Duration 4 weeks  1x/week for 4 visits over next 4-6 weeks   OT Treatment/Interventions Therapeutic exercise;Therapeutic activities;Patient/family education;Manual Therapy;Neuromuscular education;Passive range of motion;Therapeutic exercises;Moist Heat;Cognitive remediation/compensation;Self-care/ADL training   Plan Pt/family education/instruction in home program for PROM RUE supine on mat as able.   Consulted and Agree with Plan of Care Patient      Patient will benefit from skilled therapeutic intervention in order to improve the following deficits and impairments:  Decreased endurance, Decreased mobility, Pain, Impaired tone, Decreased range of motion, Decreased cognition, Decreased activity tolerance, Decreased coordination, Decreased safety awareness, Decreased strength, Impaired UE functional use, Impaired flexibility, Obesity, Impaired vision/preception, Improper body mechanics  Visit Diagnosis: Spastic hemiplegia affecting right dominant side ([place]) - Plan: Ot plan of care cert/re-cert  Muscle weakness (generalized) - Plan: Ot plan of care cert/re-cert  Other lack of coordination - Plan: Ot plan of care cert/re-cert      G-Codes - 41/28/78 1240    Functional Assessment Tool Used Reported Mod A for all ADL's related to self  care tasks   Functional Limitation Self care   Self  Care Current Status [phone]) At least 80 percent but less than 100 percent impaired, limited or restricted   Self Care Goal Status ([id number]) At least 60 percent but less than 80 percent impaired, limited or restricted      Problem List Patient Active Problem List   Diagnosis Date Noted  . Chronic kidney disease (CKD), stage IV (severe) ([place]) 07/06/2015  . Acute CVA (cerebrovascular accident) ([place])   . Acute encephalopathy 07/04/2015  . Sinus bradycardia 07/04/2015  . Type 2 diabetes mellitus with vascular disease ([place]) 07/04/2015  . UTI (lower urinary tract infection) 07/04/2015  . Parietal lobe infarction ([place]) 07/04/2015  . Spastic hemiplegia affecting nondominant side ([place]) 07/01/2015  . Obesity, morbid ([place]) 07/01/2015  . Absolute anemia   . Thrombocytopenia ([place])   . Acute ischemic VBA thalamic stroke ([place]) 01/18/2015  . AKI (acute kidney injury) ([place])   . Dysarthria   . Lethargy   . DM type 2 with diabetic peripheral neuropathy ([place])   . Labile blood pressure   . Hyperlipidemia   . History of CVA with residual deficit   . Chronic obstructive pulmonary disease ([place])   . Hemiparesis, aphasia, and dysphagia as late effect of cerebrovascular accident (CVA) ([place])   . Acute ischemic stroke ([place])   . CVA (cerebral vascular accident) ([place]) 01/14/2015  . CVA (cerebral infarction) 01/14/2015  . Right hemiplegia ([place])   . Right sided weakness 01/13/2015  . Abdominal pain 06/20/2014  . Altered mental status 02/08/2012  . Sleep apnea 12/08/2010  . Morbid obesity ([place]) 12/08/2010  . CHF (congestive heart failure) ([place]) 12/08/2010  . Chronic renal failure 12/08/2010  . TIA (transient ischemic attack) 12/08/2010  . CAD (coronary artery disease) 12/08/2010  . Cellulitis of pubic region 12/01/2010  . Uncontrolled type 2 DM with hyperosmolar nonketotic hyperglycemia ([place]) 12/01/2010    Barnhill, Amy Ardath Sax, OTR/L 10/21/2015, 12:55 PM  [facility] [address] [place], [place], [address] Phone: [phone]   Fax:  [phone]  Name: Michelle Holmes MRN: [id number] Date of Birth: July 20, 1950

## 2015-10-23 DIAGNOSIS — J449 Chronic obstructive pulmonary disease, unspecified: Secondary | ICD-10-CM | POA: Diagnosis not present

## 2015-10-23 DIAGNOSIS — G4730 Sleep apnea, unspecified: Secondary | ICD-10-CM | POA: Diagnosis not present

## 2015-10-23 DIAGNOSIS — E119 Type 2 diabetes mellitus without complications: Secondary | ICD-10-CM | POA: Diagnosis not present

## 2015-10-23 DIAGNOSIS — I6330 Cerebral infarction due to thrombosis of unspecified cerebral artery: Secondary | ICD-10-CM | POA: Diagnosis not present

## 2015-10-23 DIAGNOSIS — G459 Transient cerebral ischemic attack, unspecified: Secondary | ICD-10-CM | POA: Diagnosis not present

## 2015-10-23 DIAGNOSIS — G8191 Hemiplegia, unspecified affecting right dominant side: Secondary | ICD-10-CM | POA: Diagnosis not present

## 2015-10-28 ENCOUNTER — Ambulatory Visit: Payer: Medicare Other | Attending: Physical Medicine & Rehabilitation | Admitting: *Deleted

## 2015-10-28 ENCOUNTER — Encounter: Payer: Self-pay | Admitting: *Deleted

## 2015-10-28 DIAGNOSIS — G8111 Spastic hemiplegia affecting right dominant side: Secondary | ICD-10-CM | POA: Insufficient documentation

## 2015-10-28 DIAGNOSIS — M6281 Muscle weakness (generalized): Secondary | ICD-10-CM | POA: Diagnosis not present

## 2015-10-28 DIAGNOSIS — G8191 Hemiplegia, unspecified affecting right dominant side: Secondary | ICD-10-CM | POA: Diagnosis not present

## 2015-10-28 DIAGNOSIS — G4730 Sleep apnea, unspecified: Secondary | ICD-10-CM | POA: Diagnosis not present

## 2015-10-28 DIAGNOSIS — R278 Other lack of coordination: Secondary | ICD-10-CM | POA: Diagnosis not present

## 2015-10-28 DIAGNOSIS — G459 Transient cerebral ischemic attack, unspecified: Secondary | ICD-10-CM | POA: Diagnosis not present

## 2015-10-28 DIAGNOSIS — I6330 Cerebral infarction due to thrombosis of unspecified cerebral artery: Secondary | ICD-10-CM | POA: Diagnosis not present

## 2015-10-28 DIAGNOSIS — E119 Type 2 diabetes mellitus without complications: Secondary | ICD-10-CM | POA: Diagnosis not present

## 2015-10-28 NOTE — Therapy (Signed)
[facility] [address] [place], [place], [address] Phone: [phone]   Fax:  [phone]  Occupational Therapy Treatment  Patient Details  Name: Michelle Holmes MRN: [id number] Date of Birth: 1950/02/24 Referring Provider: Dr Alysia Penna  Encounter Date: 10/28/2015      OT End of Session - 10/28/15 1211    Visit Number 2   Number of Visits 5   Date for OT Re-Evaluation 11/20/15   Authorization Type UHC Medicare (no visit limit, no authorization, G code needed)   Authorization - Visit Number 2   Authorization - Number of Visits --  099 visits for pt/family education in HEP for RUE   OT Start Time 1109   OT Stop Time 1147   OT Time Calculation (min) 38 min   Activity Tolerance Other (comment)  Pt limited by low back pain after attempting supine on mat table for self range of motion exercises. Pt also broke a fingernail on her R hand when returning from supine to sit when it got caught on her shirt. Pt was initially teary but was laughing at end of session.    Behavior During Therapy --  Teary, Labile, WFL's      Past Medical History:  Diagnosis Date  . Anemia   . CHF (congestive heart failure) ([place])   . Chronic back pain   . COPD (chronic obstructive pulmonary disease) ([place])   . Coronary artery disease   . Diabetes mellitus   . DJD (degenerative joint disease)   . Emphysema   . Hypercholesteremia   . Hypertension   . Myocardial infarction   . Obesity   . Obesity hypoventilation syndrome ([place])   . Renal insufficiency   . Scoliosis   . Sleep apnea   . Stroke ([place])   . TIA (transient ischemic attack)     Past Surgical History:  Procedure Laterality Date  . COLON SURGERY    . CORONARY ANGIOPLASTY WITH STENT PLACEMENT      There were no vitals filed for this visit.                    OT Treatments/Exercises (OP) - 10/28/15 0001      Bed Mobility   Supine to Sit 2: Max assist;1:  +2 Total assist   Sit to Supine 2: Max assist;1: +2 Total assist     Transfers   Sit to Stand 3: Mod assist;1: +2 Total assist   Stand to Sit 3: Mod assist;1: +2 Total assist   Comments tranferred from w/c to mat table w/ +2 Mod A and vc's. Pt was agreeable to lying down for PROM/self range for LUE and pt instruction, however, after pt was lying down, she began to cry and stated that her low back hurt. Pt was assisted back up into sitting with +2 Mod Assist and was teary for a few minutes. Pt was then assisted back into her w/c where PROM HEP was issued, performed and reviewed.     Exercises   Exercises Shoulder;Elbow;Wrist;Hand;Neurological Re-education     Neurological Re-education Exercises   Other Exercises 1 Pt is s/o botox to R shoulder secondary to increased tone, she was instructed in home program for passive/self range of motion for RUE shoulder flexion, ABD, ADD, passive elbow flex/exten, wrist PROM flexion/extension; supination & hemiplegic exercises in seated position secondary to pt not able to tolerate supine ex's due to c/o low back pain when in supine position on mat table.  Pt states that she has hospital bed at home which is moe comfortable for her.   Other Exercises 2 Handouts issued, reviewed and ex's performed in sitting above today. Pt was educated again that she will need a family member present at next few therapy sesions to assist with carryover and follow through of HEP. Pt verbalized understanding and then called her friend, Matilde Haymaker, whom brings her to therapy appointments & told him. This friend verbalized understanding and tols pt that he would "try to come to next session" for instruction in home program.                OT Education - 10/28/15 1208    Education provided Yes   Education Details Pt was educated to bring family member with her to next few sessions for family and pt education for RUE HEP to assist with follow through. Pt verbalized understanding  again and also called "trenton" (family friend) and told him - see note.   Person(s) Educated Patient   Methods Explanation;Demonstration;Tactile cues;Handout;Verbal cues   Comprehension Verbalized understanding;Need further instruction          OT Short Term Goals - 10/21/15 1233      OT SHORT TERM GOAL #1   Title STG's = LTG's           OT Long Term Goals - 10/21/15 1258      OT LONG TERM GOAL #5   Title No goal               Plan - 10/28/15 1215    Clinical Impression Statement Pt was instructed in home program for RUE self range of motion following botox R shoulder due to spasticity, in Aug 2017. Pt was dropped off for her appointment and did not have any family present with her. She was educated again, that she needs to have someone come to the next few therapy sessions for support and follow through with Home program. She verbalzed understanding of this and called "Matilde Haymaker" family friend in clinic and he verbalized understanding say that he "would try to come to next session if able." Pt should benefit from Home program for self range of motion and hemiplegic exercises to assist with ADL's and decreased burden of care. Pt expressed agreement with having family assist with this goal in mind.   Rehab Potential Fair   Clinical Impairments Affecting Rehab Potential no caregiver support (during previous out-pt therapy sessions affecting carryover at home)   OT Frequency 1x / week   OT Duration --  x4 visits over next 4-6 weeks   OT Treatment/Interventions Therapeutic exercise;Therapeutic activities;Patient/family education;Manual Therapy;Neuromuscular education;Passive range of motion;Therapeutic exercises;Moist Heat;Cognitive remediation/compensation;Self-care/ADL training   Plan Pt/family education and instruction in home program for self range of motion.    Consulted and Agree with Plan of Care Patient      Patient will benefit from skilled therapeutic intervention  in order to improve the following deficits and impairments:  Decreased endurance, Decreased mobility, Pain, Impaired tone, Decreased range of motion, Decreased cognition, Decreased activity tolerance, Decreased coordination, Decreased safety awareness, Decreased strength, Impaired UE functional use, Impaired flexibility, Obesity, Impaired vision/preception, Improper body mechanics  Visit Diagnosis: Muscle weakness (generalized)  Other lack of coordination  Spastic hemiplegia affecting right dominant side, unspecified etiology ([place])    Problem List Patient Active Problem List   Diagnosis Date Noted  . Chronic kidney disease (CKD), stage IV (severe) ([place]) 07/06/2015  . Acute CVA (cerebrovascular accident) ([place])   .  Acute encephalopathy 07/04/2015  . Sinus bradycardia 07/04/2015  . Type 2 diabetes mellitus with vascular disease ([place]) 07/04/2015  . UTI (lower urinary tract infection) 07/04/2015  . Parietal lobe infarction ([place]) 07/04/2015  . Spastic hemiplegia affecting nondominant side ([place]) 07/01/2015  . Obesity, morbid ([place]) 07/01/2015  . Absolute anemia   . Thrombocytopenia ([place])   . Acute ischemic VBA thalamic stroke ([place]) 01/18/2015  . AKI (acute kidney injury) ([place])   . Dysarthria   . Lethargy   . DM type 2 with diabetic peripheral neuropathy ([place])   . Labile blood pressure   . Hyperlipidemia   . History of CVA with residual deficit   . Chronic obstructive pulmonary disease ([place])   . Hemiparesis, aphasia, and dysphagia as late effect of cerebrovascular accident (CVA) ([place])   . Acute ischemic stroke ([place])   . CVA (cerebral vascular accident) ([place]) 01/14/2015  . CVA (cerebral infarction) 01/14/2015  . Right hemiplegia ([place])   . Right sided weakness 01/13/2015  . Abdominal pain 06/20/2014  . Altered mental status 02/08/2012  . Sleep apnea 12/08/2010  . Morbid obesity ([place]) 12/08/2010  . CHF (congestive heart failure) ([place]) 12/08/2010  . Chronic renal failure 12/08/2010   . TIA (transient ischemic attack) 12/08/2010  . CAD (coronary artery disease) 12/08/2010  . Cellulitis of pubic region 12/01/2010  . Uncontrolled type 2 DM with hyperosmolar nonketotic hyperglycemia ([place]) 12/01/2010    Jayven Naill Ardath Sax, OTR/L 10/28/2015, 12:25 PM  [facility] [address] [place], [place], [address] Phone: [phone]   Fax:  [phone]  Name: Michelle Holmes MRN: [id number] Date of Birth: [birth year]

## 2015-11-01 ENCOUNTER — Encounter: Payer: Self-pay | Admitting: Physical Medicine & Rehabilitation

## 2015-11-01 ENCOUNTER — Ambulatory Visit (HOSPITAL_BASED_OUTPATIENT_CLINIC_OR_DEPARTMENT_OTHER): Payer: Medicare Other | Admitting: Physical Medicine & Rehabilitation

## 2015-11-01 ENCOUNTER — Encounter: Payer: Medicare Other | Attending: Registered Nurse

## 2015-11-01 VITALS — BP 159/74 | HR 62 | Resp 14

## 2015-11-01 DIAGNOSIS — I639 Cerebral infarction, unspecified: Secondary | ICD-10-CM

## 2015-11-01 DIAGNOSIS — G8191 Hemiplegia, unspecified affecting right dominant side: Secondary | ICD-10-CM | POA: Diagnosis not present

## 2015-11-01 DIAGNOSIS — I2510 Atherosclerotic heart disease of native coronary artery without angina pectoris: Secondary | ICD-10-CM | POA: Diagnosis not present

## 2015-11-01 DIAGNOSIS — G8111 Spastic hemiplegia affecting right dominant side: Secondary | ICD-10-CM | POA: Diagnosis not present

## 2015-11-01 DIAGNOSIS — J449 Chronic obstructive pulmonary disease, unspecified: Secondary | ICD-10-CM | POA: Diagnosis not present

## 2015-11-01 DIAGNOSIS — I110 Hypertensive heart disease with heart failure: Secondary | ICD-10-CM | POA: Insufficient documentation

## 2015-11-01 DIAGNOSIS — I509 Heart failure, unspecified: Secondary | ICD-10-CM | POA: Diagnosis not present

## 2015-11-01 DIAGNOSIS — IMO0002 Reserved for concepts with insufficient information to code with codable children: Secondary | ICD-10-CM

## 2015-11-01 DIAGNOSIS — E119 Type 2 diabetes mellitus without complications: Secondary | ICD-10-CM | POA: Insufficient documentation

## 2015-11-01 DIAGNOSIS — E7800 Pure hypercholesterolemia, unspecified: Secondary | ICD-10-CM | POA: Insufficient documentation

## 2015-11-01 DIAGNOSIS — G8110 Spastic hemiplegia affecting unspecified side: Secondary | ICD-10-CM | POA: Diagnosis not present

## 2015-11-01 DIAGNOSIS — E662 Morbid (severe) obesity with alveolar hypoventilation: Secondary | ICD-10-CM | POA: Diagnosis not present

## 2015-11-01 MED ORDER — TRAMADOL HCL 50 MG PO TABS: 50.0000 mg | ORAL_TABLET | Freq: Two times a day (BID) | ORAL | 0 refills | Status: DC | PRN

## 2015-11-01 NOTE — Progress Notes (Signed)
Subjective:    Patient ID: Michelle Holmes, female    DOB: 01-12-1951, 65 y.o.   MRN: [id number]  HPI  65 year old female with history of left PCA as well as right internal capsule infarcts She has completed outpatient therapy. She's been complaining of increasing low back pain, however, and this has been limiting her mobility. She has been tried on Clinical research associate. However, she ran into a wall and is now no longer felt to be safe using this. She continues to require maximum assistance for transfers. She also requires assistance for bathing and dressing. She has had approval for personal care aid. She is requesting medication for pain. We discussed that we have to be very careful about prescribing pain medications in the case of the patient was had strokes due to increased risk of side effects as well as fall risk. Pain Inventory Average Pain 7 Pain Right Now 5 My pain is aching  In the last 24 hours, has pain interfered with the following? General activity 6 Relation with others 5 Enjoyment of life 5 What TIME of day is your pain at its worst? morning Sleep (in general) Good  Pain is worse with: walking, bending and standing Pain improves with: rest, therapy/exercise and medication Relief from Meds: 10  Mobility how many minutes can you walk? 0 mintues use a wheelchair Do you have any goals in this area?  yes  Function disabled: date disabled 2016 I need assistance with the following:  dressing, bathing, toileting, meal prep and household duties  Neuro/Psych No problems in this area  Prior Studies Any changes since last visit?  no  Physicians involved in your care Any changes since last visit?  no   Family History  Problem Relation Age of Onset  . Stroke Maternal Aunt    Social History   Social History  . Marital status: Widowed    Spouse name: N/A  . Number of children: N/A  . Years of education: N/A   Social History Main Topics  . Smoking status: Former  Research scientist (life sciences)  . Smokeless tobacco: Former Systems developer  . Alcohol use No  . Drug use: No  . Sexual activity: Not on file   Other Topics Concern  . Not on file   Social History Narrative  . No narrative on file   Past Surgical History:  Procedure Laterality Date  . COLON SURGERY    . CORONARY ANGIOPLASTY WITH STENT PLACEMENT     Past Medical History:  Diagnosis Date  . Anemia   . CHF (congestive heart failure) ([place])   . Chronic back pain   . COPD (chronic obstructive pulmonary disease) ([place])   . Coronary artery disease   . Diabetes mellitus   . DJD (degenerative joint disease)   . Emphysema   . Hypercholesteremia   . Hypertension   . Myocardial infarction   . Obesity   . Obesity hypoventilation syndrome ([place])   . Renal insufficiency   . Scoliosis   . Sleep apnea   . Stroke ([place])   . TIA (transient ischemic attack)    BP (!) 159/74   Pulse 62   Resp 14   SpO2 97%   Opioid Risk Score:   Fall Risk Score:  `1  Depression screen PHQ 2/9  No flowsheet data found.   Review of Systems  Respiratory: Positive for apnea.   Endocrine:       High Blood sugar  All other systems reviewed and are negative.  Objective:   Physical Exam Elderly female in no acute distress. She sits with poor wheelchair positioning in a posterior pelvic tilt. She has no lotion me. Pain with range of motion. No upper extremity pain with range of motion. Tone she has Ashworth grade 1 spasticity in the right wrist flexors, 1-2 in the finger flexors 1, at the thumb flexor. Ashworth 1 at the elbow flexors. No evidence of flexor withdrawal in the right lower limb.  0/5 strength in right deltoid, biceps, triceps, grip, 3 minus. Knee extension, trace ankle dorsiflexion, plantarflexion      Assessment & Plan:  1. Right spastic plegia secondary CVA. She has chronic disability and requires assistance for self-care and mobility. She is not safe with power wheelchair usage due to cognitive issues such as  poor awareness. Recommend personal care assistant She does have some return of right lower extremity, we'll see if PT and home health can work on transfers.  2. Repeat Botox injection right upper extremity in 6-7 weeks

## 2015-11-05 ENCOUNTER — Encounter: Payer: Self-pay | Admitting: Occupational Therapy

## 2015-11-05 ENCOUNTER — Telehealth: Payer: Self-pay | Admitting: Physical Medicine & Rehabilitation

## 2015-11-05 NOTE — Telephone Encounter (Signed)
Patient is wanting her therapy with [facility] their number is [phone] and fax number is [phone].  Any questions please call Abagail Kitchens at [phone].

## 2015-11-05 NOTE — Telephone Encounter (Signed)
I called Abagail Kitchens back to get more details on what kind of therapy the patient needs. I left a message to have [place] to call back and give [place] more details

## 2015-11-06 ENCOUNTER — Telehealth: Payer: Self-pay | Admitting: Physical Medicine & Rehabilitation

## 2015-11-06 NOTE — Telephone Encounter (Signed)
Contacted pt and informed that referral is in process, Corene Cornea is aware

## 2015-11-06 NOTE — Telephone Encounter (Signed)
Patient is calling back about referral for Home Health to be sent to [facility].  Please call patient at [phone].

## 2015-11-12 ENCOUNTER — Encounter: Payer: Self-pay | Admitting: *Deleted

## 2015-11-14 DIAGNOSIS — I509 Heart failure, unspecified: Secondary | ICD-10-CM | POA: Diagnosis not present

## 2015-11-16 DIAGNOSIS — G4733 Obstructive sleep apnea (adult) (pediatric): Secondary | ICD-10-CM | POA: Diagnosis not present

## 2015-11-18 ENCOUNTER — Encounter: Payer: Self-pay | Admitting: *Deleted

## 2015-11-21 DIAGNOSIS — S0003XD Contusion of scalp, subsequent encounter: Secondary | ICD-10-CM | POA: Diagnosis not present

## 2015-11-21 DIAGNOSIS — M545 Low back pain: Secondary | ICD-10-CM | POA: Diagnosis not present

## 2015-11-21 DIAGNOSIS — I10 Essential (primary) hypertension: Secondary | ICD-10-CM | POA: Diagnosis not present

## 2015-11-21 DIAGNOSIS — E119 Type 2 diabetes mellitus without complications: Secondary | ICD-10-CM | POA: Diagnosis not present

## 2015-11-22 DIAGNOSIS — J449 Chronic obstructive pulmonary disease, unspecified: Secondary | ICD-10-CM | POA: Diagnosis not present

## 2015-11-23 ENCOUNTER — Encounter (HOSPITAL_COMMUNITY): Payer: Self-pay

## 2015-11-23 ENCOUNTER — Inpatient Hospital Stay (HOSPITAL_COMMUNITY)
Admission: EM | Admit: 2015-11-23 | Discharge: 2015-11-29 | DRG: 682 | Disposition: A | Discharge status: Home / Self Care | Payer: Medicare Other | Attending: Internal Medicine | Admitting: Internal Medicine

## 2015-11-23 ENCOUNTER — Emergency Department (HOSPITAL_COMMUNITY): Payer: Medicare Other

## 2015-11-23 DIAGNOSIS — L8990 Pressure ulcer of unspecified site, unspecified stage: Secondary | ICD-10-CM | POA: Insufficient documentation

## 2015-11-23 DIAGNOSIS — B952 Enterococcus as the cause of diseases classified elsewhere: Secondary | ICD-10-CM | POA: Diagnosis present

## 2015-11-23 DIAGNOSIS — E1122 Type 2 diabetes mellitus with diabetic chronic kidney disease: Secondary | ICD-10-CM | POA: Diagnosis present

## 2015-11-23 DIAGNOSIS — I509 Heart failure, unspecified: Secondary | ICD-10-CM | POA: Diagnosis present

## 2015-11-23 DIAGNOSIS — E1143 Type 2 diabetes mellitus with diabetic autonomic (poly)neuropathy: Secondary | ICD-10-CM | POA: Diagnosis not present

## 2015-11-23 DIAGNOSIS — N3000 Acute cystitis without hematuria: Secondary | ICD-10-CM | POA: Diagnosis not present

## 2015-11-23 DIAGNOSIS — E662 Morbid (severe) obesity with alveolar hypoventilation: Secondary | ICD-10-CM | POA: Diagnosis present

## 2015-11-23 DIAGNOSIS — N200 Calculus of kidney: Secondary | ICD-10-CM | POA: Diagnosis present

## 2015-11-23 DIAGNOSIS — I130 Hypertensive heart and chronic kidney disease with heart failure and stage 1 through stage 4 chronic kidney disease, or unspecified chronic kidney disease: Secondary | ICD-10-CM | POA: Diagnosis present

## 2015-11-23 DIAGNOSIS — G8191 Hemiplegia, unspecified affecting right dominant side: Secondary | ICD-10-CM | POA: Diagnosis not present

## 2015-11-23 DIAGNOSIS — R339 Retention of urine, unspecified: Secondary | ICD-10-CM | POA: Diagnosis not present

## 2015-11-23 DIAGNOSIS — D509 Iron deficiency anemia, unspecified: Secondary | ICD-10-CM | POA: Diagnosis present

## 2015-11-23 DIAGNOSIS — N184 Chronic kidney disease, stage 4 (severe): Secondary | ICD-10-CM | POA: Diagnosis not present

## 2015-11-23 DIAGNOSIS — R410 Disorientation, unspecified: Secondary | ICD-10-CM | POA: Diagnosis not present

## 2015-11-23 DIAGNOSIS — I959 Hypotension, unspecified: Secondary | ICD-10-CM | POA: Diagnosis present

## 2015-11-23 DIAGNOSIS — R509 Fever, unspecified: Secondary | ICD-10-CM

## 2015-11-23 DIAGNOSIS — D631 Anemia in chronic kidney disease: Secondary | ICD-10-CM

## 2015-11-23 DIAGNOSIS — Z66 Do not resuscitate: Secondary | ICD-10-CM | POA: Diagnosis present

## 2015-11-23 DIAGNOSIS — E1121 Type 2 diabetes mellitus with diabetic nephropathy: Secondary | ICD-10-CM | POA: Diagnosis present

## 2015-11-23 DIAGNOSIS — D649 Anemia, unspecified: Secondary | ICD-10-CM | POA: Diagnosis present

## 2015-11-23 DIAGNOSIS — R5383 Other fatigue: Secondary | ICD-10-CM | POA: Diagnosis not present

## 2015-11-23 DIAGNOSIS — E860 Dehydration: Secondary | ICD-10-CM | POA: Diagnosis not present

## 2015-11-23 DIAGNOSIS — N179 Acute kidney failure, unspecified: Secondary | ICD-10-CM | POA: Diagnosis not present

## 2015-11-23 DIAGNOSIS — I69320 Aphasia following cerebral infarction: Secondary | ICD-10-CM

## 2015-11-23 DIAGNOSIS — M549 Dorsalgia, unspecified: Secondary | ICD-10-CM | POA: Diagnosis present

## 2015-11-23 DIAGNOSIS — E1165 Type 2 diabetes mellitus with hyperglycemia: Secondary | ICD-10-CM | POA: Diagnosis present

## 2015-11-23 DIAGNOSIS — I2510 Atherosclerotic heart disease of native coronary artery without angina pectoris: Secondary | ICD-10-CM | POA: Diagnosis not present

## 2015-11-23 DIAGNOSIS — N189 Chronic kidney disease, unspecified: Secondary | ICD-10-CM

## 2015-11-23 DIAGNOSIS — Z794 Long term (current) use of insulin: Secondary | ICD-10-CM | POA: Diagnosis not present

## 2015-11-23 DIAGNOSIS — I69391 Dysphagia following cerebral infarction: Secondary | ICD-10-CM

## 2015-11-23 DIAGNOSIS — Z87891 Personal history of nicotine dependence: Secondary | ICD-10-CM

## 2015-11-23 DIAGNOSIS — M419 Scoliosis, unspecified: Secondary | ICD-10-CM | POA: Diagnosis present

## 2015-11-23 DIAGNOSIS — N19 Unspecified kidney failure: Secondary | ICD-10-CM

## 2015-11-23 DIAGNOSIS — I6930 Unspecified sequelae of cerebral infarction: Secondary | ICD-10-CM

## 2015-11-23 DIAGNOSIS — D638 Anemia in other chronic diseases classified elsewhere: Secondary | ICD-10-CM

## 2015-11-23 DIAGNOSIS — R338 Other retention of urine: Secondary | ICD-10-CM | POA: Diagnosis not present

## 2015-11-23 DIAGNOSIS — R1310 Dysphagia, unspecified: Secondary | ICD-10-CM | POA: Diagnosis present

## 2015-11-23 DIAGNOSIS — I69351 Hemiplegia and hemiparesis following cerebral infarction affecting right dominant side: Secondary | ICD-10-CM

## 2015-11-23 DIAGNOSIS — E785 Hyperlipidemia, unspecified: Secondary | ICD-10-CM | POA: Diagnosis present

## 2015-11-23 DIAGNOSIS — G8929 Other chronic pain: Secondary | ICD-10-CM | POA: Diagnosis not present

## 2015-11-23 DIAGNOSIS — R404 Transient alteration of awareness: Secondary | ICD-10-CM | POA: Diagnosis not present

## 2015-11-23 DIAGNOSIS — Z7902 Long term (current) use of antithrombotics/antiplatelets: Secondary | ICD-10-CM

## 2015-11-23 DIAGNOSIS — L89109 Pressure ulcer of unspecified part of back, unspecified stage: Secondary | ICD-10-CM | POA: Diagnosis present

## 2015-11-23 DIAGNOSIS — Z7982 Long term (current) use of aspirin: Secondary | ICD-10-CM

## 2015-11-23 DIAGNOSIS — Z955 Presence of coronary angioplasty implant and graft: Secondary | ICD-10-CM

## 2015-11-23 DIAGNOSIS — G9341 Metabolic encephalopathy: Secondary | ICD-10-CM | POA: Diagnosis not present

## 2015-11-23 DIAGNOSIS — N390 Urinary tract infection, site not specified: Secondary | ICD-10-CM | POA: Diagnosis not present

## 2015-11-23 DIAGNOSIS — I252 Old myocardial infarction: Secondary | ICD-10-CM

## 2015-11-23 LAB — CBC WITH DIFFERENTIAL/PLATELET
Basophils Absolute: 0 10*3/uL (ref 0.0–0.1)
Basophils Relative: 0 %
Eosinophils Absolute: 0.3 10*3/uL (ref 0.0–0.7)
Eosinophils Relative: 4 %
HCT: 27.5 % — ABNORMAL LOW (ref 36.0–46.0)
Hemoglobin: 8.7 g/dL — ABNORMAL LOW (ref 12.0–15.0)
Lymphocytes Relative: 25 %
Lymphs Abs: 1.8 10*3/uL (ref 0.7–4.0)
MCH: 28.8 pg (ref 26.0–34.0)
MCHC: 31.6 g/dL (ref 30.0–36.0)
MCV: 91.1 fL (ref 78.0–100.0)
Monocytes Absolute: 0.4 10*3/uL (ref 0.1–1.0)
Monocytes Relative: 6 %
Neutro Abs: 4.8 10*3/uL (ref 1.7–7.7)
Neutrophils Relative %: 65 %
Platelets: 227 10*3/uL (ref 150–400)
RBC: 3.02 MIL/uL — ABNORMAL LOW (ref 3.87–5.11)
RDW: 14.1 % (ref 11.5–15.5)
WBC: 7.3 10*3/uL (ref 4.0–10.5)

## 2015-11-23 LAB — GLUCOSE, CAPILLARY: Glucose-Capillary: 128 mg/dL — ABNORMAL HIGH (ref 65–99)

## 2015-11-23 LAB — URINALYSIS, ROUTINE W REFLEX MICROSCOPIC
Bilirubin Urine: NEGATIVE
Glucose, UA: NEGATIVE mg/dL
Hgb urine dipstick: NEGATIVE
Ketones, ur: NEGATIVE mg/dL
Nitrite: NEGATIVE
Protein, ur: NEGATIVE mg/dL
Specific Gravity, Urine: 1.017 (ref 1.005–1.030)
pH: 5 (ref 5.0–8.0)

## 2015-11-23 LAB — BASIC METABOLIC PANEL
Anion gap: 9 (ref 5–15)
BUN: 65 mg/dL — ABNORMAL HIGH (ref 6–20)
CO2: 22 mmol/L (ref 22–32)
Calcium: 9 mg/dL (ref 8.9–10.3)
Chloride: 111 mmol/L (ref 101–111)
Creatinine, Ser: 3.09 mg/dL — ABNORMAL HIGH (ref 0.44–1.00)
GFR calc Af Amer: 17 mL/min — ABNORMAL LOW (ref >60)
GFR calc non Af Amer: 15 mL/min — ABNORMAL LOW (ref >60)
Glucose, Bld: 160 mg/dL — ABNORMAL HIGH (ref 65–99)
Potassium: 4.2 mmol/L (ref 3.5–5.1)
Sodium: 142 mmol/L (ref 135–145)

## 2015-11-23 LAB — POC OCCULT BLOOD, ED: Fecal Occult Bld: NEGATIVE

## 2015-11-23 LAB — URINE MICROSCOPIC-ADD ON: RBC / HPF: NONE SEEN RBC/hpf (ref 0–5)

## 2015-11-23 MED ORDER — INSULIN ASPART 100 UNIT/ML ~~LOC~~ SOLN
0.0000 [IU] | Freq: Three times a day (TID) | SUBCUTANEOUS | Status: DC
  Administered 2015-11-25: 1 [IU] via SUBCUTANEOUS

## 2015-11-23 MED ORDER — PANTOPRAZOLE SODIUM 40 MG PO TBEC
40.0000 mg | DELAYED_RELEASE_TABLET | Freq: Every day | ORAL | Status: DC
  Administered 2015-11-23 – 2015-11-28 (×6): 40 mg via ORAL
  Filled 2015-11-23 (×7): qty 1

## 2015-11-23 MED ORDER — AMLODIPINE BESYLATE 5 MG PO TABS
5.0000 mg | ORAL_TABLET | Freq: Every day | ORAL | Status: DC
  Administered 2015-11-23 – 2015-11-24 (×2): 5 mg via ORAL
  Filled 2015-11-23 (×2): qty 1

## 2015-11-23 MED ORDER — CLOPIDOGREL BISULFATE 75 MG PO TABS
75.0000 mg | ORAL_TABLET | Freq: Every day | ORAL | Status: DC
  Administered 2015-11-23 – 2015-11-29 (×7): 75 mg via ORAL
  Filled 2015-11-23 (×7): qty 1

## 2015-11-23 MED ORDER — FERROUS SULFATE 325 (65 FE) MG PO TABS
325.0000 mg | ORAL_TABLET | Freq: Two times a day (BID) | ORAL | Status: DC
  Administered 2015-11-24 – 2015-11-29 (×9): 325 mg via ORAL
  Filled 2015-11-23 (×10): qty 1

## 2015-11-23 MED ORDER — ONDANSETRON HCL 4 MG/2ML IJ SOLN
4.0000 mg | Freq: Four times a day (QID) | INTRAMUSCULAR | Status: DC | PRN
  Administered 2015-11-24: 4 mg via INTRAVENOUS
  Filled 2015-11-23: qty 2

## 2015-11-23 MED ORDER — PREGABALIN 50 MG PO CAPS
50.0000 mg | ORAL_CAPSULE | Freq: Every day | ORAL | Status: DC
  Administered 2015-11-23 – 2015-11-29 (×7): 50 mg via ORAL
  Filled 2015-11-23 (×7): qty 1

## 2015-11-23 MED ORDER — CIPROFLOXACIN IN D5W 400 MG/200ML IV SOLN
400.0000 mg | Freq: Once | INTRAVENOUS | Status: AC
  Administered 2015-11-23: 400 mg via INTRAVENOUS
  Filled 2015-11-23: qty 200

## 2015-11-23 MED ORDER — ONDANSETRON HCL 4 MG PO TABS: 4.0000 mg | ORAL_TABLET | Freq: Four times a day (QID) | ORAL | Status: DC | PRN

## 2015-11-23 MED ORDER — SODIUM CHLORIDE 0.9 % IV SOLN
INTRAVENOUS | Status: DC
  Administered 2015-11-23 – 2015-11-25 (×3): via INTRAVENOUS

## 2015-11-23 MED ORDER — SODIUM CHLORIDE 0.9 % IV BOLUS (SEPSIS)
500.0000 mL | Freq: Once | INTRAVENOUS | Status: AC
  Administered 2015-11-23: 500 mL via INTRAVENOUS

## 2015-11-23 MED ORDER — TRAZODONE HCL 50 MG PO TABS
50.0000 mg | ORAL_TABLET | Freq: Every evening | ORAL | Status: DC | PRN
  Administered 2015-11-23 – 2015-11-24 (×2): 50 mg via ORAL
  Filled 2015-11-23 (×2): qty 1

## 2015-11-23 MED ORDER — INSULIN GLARGINE 100 UNIT/ML ~~LOC~~ SOLN
10.0000 [IU] | Freq: Every day | SUBCUTANEOUS | Status: DC
  Administered 2015-11-23 – 2015-11-28 (×6): 10 [IU] via SUBCUTANEOUS
  Filled 2015-11-23 (×7): qty 0.1

## 2015-11-23 MED ORDER — ASPIRIN EC 81 MG PO TBEC
81.0000 mg | DELAYED_RELEASE_TABLET | Freq: Every day | ORAL | Status: DC
  Administered 2015-11-24 – 2015-11-28 (×5): 81 mg via ORAL
  Filled 2015-11-23 (×6): qty 1

## 2015-11-23 MED ORDER — HYDROCODONE-ACETAMINOPHEN 5-325 MG PO TABS
1.0000 | ORAL_TABLET | ORAL | Status: DC | PRN
  Administered 2015-11-24: 1 via ORAL
  Filled 2015-11-23: qty 1

## 2015-11-23 MED ORDER — HEPARIN SODIUM (PORCINE) 5000 UNIT/ML IJ SOLN
5000.0000 [IU] | Freq: Three times a day (TID) | INTRAMUSCULAR | Status: DC
  Administered 2015-11-23 – 2015-11-26 (×8): 5000 [IU] via SUBCUTANEOUS
  Filled 2015-11-23 (×8): qty 1

## 2015-11-23 MED ORDER — MOMETASONE FURO-FORMOTEROL FUM 200-5 MCG/ACT IN AERO
2.0000 | INHALATION_SPRAY | Freq: Two times a day (BID) | RESPIRATORY_TRACT | Status: DC
  Administered 2015-11-23 – 2015-11-29 (×11): 2 via RESPIRATORY_TRACT
  Filled 2015-11-23: qty 8.8

## 2015-11-23 MED ORDER — HYDROMORPHONE HCL 1 MG/ML IJ SOLN
1.0000 mg | INTRAMUSCULAR | Status: DC | PRN
  Administered 2015-11-28: 1 mg via INTRAVENOUS
  Filled 2015-11-23 (×2): qty 1

## 2015-11-23 MED ORDER — CIPROFLOXACIN IN D5W 400 MG/200ML IV SOLN
400.0000 mg | Freq: Two times a day (BID) | INTRAVENOUS | Status: DC
  Administered 2015-11-24: 400 mg via INTRAVENOUS
  Filled 2015-11-23: qty 200

## 2015-11-23 MED ORDER — SODIUM CHLORIDE 0.9% FLUSH
3.0000 mL | Freq: Two times a day (BID) | INTRAVENOUS | Status: DC
  Administered 2015-11-23 – 2015-11-27 (×2): 3 mL via INTRAVENOUS

## 2015-11-23 MED ORDER — METOPROLOL SUCCINATE ER 50 MG PO TB24
50.0000 mg | ORAL_TABLET | Freq: Every day | ORAL | Status: DC
  Administered 2015-11-23 – 2015-11-29 (×7): 50 mg via ORAL
  Filled 2015-11-23 (×7): qty 1

## 2015-11-23 NOTE — ED Notes (Signed)
Pt's daughters:  [name] [phone] Otila Kluver [phone]

## 2015-11-23 NOTE — ED Notes (Signed)
Pt's daughter(Tina) called and is concerned that we are considering sending her home.  Sts "this is the same thing that happened last time and she had had a stroke."  Further, daughter sts "I will send her right back up there, if she's acting the same way or if she is sleepy."  Called transferred to [facility] MD.

## 2015-11-23 NOTE — ED Provider Notes (Signed)
[facility] DEPT Provider Note   CSN: [id number] Arrival date & time: 11/23/15  1016     History   Chief Complaint Chief Complaint  Patient presents with  . Back Pain  . Urinary Complaints    HPI Michelle Holmes is a 65 y.o. female.  Level V caveat for confusion. Patient arrived to the emergency department by herself via EMS. Later in the day, I received a phone call from the daughter.  She stated that her mother has been "talking differtently", was sleepy, tired, more confused than usual. Patient is cared for by all members the family in a rotating fashion. She has a primary care doctor.  Patient complains of low back pain, dysuria.  She is normally in a wheelchair and is unable to walk.      Past Medical History:  Diagnosis Date  . Anemia   . CHF (congestive heart failure) ([place])   . Chronic back pain   . COPD (chronic obstructive pulmonary disease) ([place])   . Coronary artery disease   . Diabetes mellitus   . DJD (degenerative joint disease)   . Emphysema   . Hypercholesteremia   . Hypertension   . Myocardial infarction   . Obesity   . Obesity hypoventilation syndrome ([place])   . Renal insufficiency   . Scoliosis   . Sleep apnea   . Stroke ([place])   . TIA (transient ischemic attack)     Patient Active Problem List   Diagnosis Date Noted  . Chronic kidney disease (CKD), stage IV (severe) ([place]) 07/06/2015  . Acute CVA (cerebrovascular accident) ([place])   . Acute encephalopathy 07/04/2015  . Sinus bradycardia 07/04/2015  . Type 2 diabetes mellitus with vascular disease ([place]) 07/04/2015  . UTI (lower urinary tract infection) 07/04/2015  . Parietal lobe infarction ([place]) 07/04/2015  . Spastic hemiplegia affecting nondominant side ([place]) 07/01/2015  . Obesity, morbid ([place]) 07/01/2015  . Absolute anemia   . Thrombocytopenia ([place])   . Acute ischemic VBA thalamic stroke ([place]) 01/18/2015  . AKI (acute kidney injury) ([place])   . Dysarthria   . Lethargy   . DM type 2 with  diabetic peripheral neuropathy ([place])   . Labile blood pressure   . Hyperlipidemia   . History of CVA with residual deficit   . Chronic obstructive pulmonary disease ([place])   . Hemiparesis, aphasia, and dysphagia as late effect of cerebrovascular accident (CVA) ([place])   . Acute ischemic stroke ([place])   . CVA (cerebral vascular accident) ([place]) 01/14/2015  . CVA (cerebral infarction) 01/14/2015  . Right hemiplegia ([place])   . Right sided weakness 01/13/2015  . Abdominal pain 06/20/2014  . Altered mental status 02/08/2012  . Sleep apnea 12/08/2010  . Morbid obesity ([place]) 12/08/2010  . CHF (congestive heart failure) ([place]) 12/08/2010  . Chronic renal failure 12/08/2010  . TIA (transient ischemic attack) 12/08/2010  . CAD (coronary artery disease) 12/08/2010  . Cellulitis of pubic region 12/01/2010  . Uncontrolled type 2 DM with hyperosmolar nonketotic hyperglycemia ([place]) 12/01/2010    Past Surgical History:  Procedure Laterality Date  . COLON SURGERY    . CORONARY ANGIOPLASTY WITH STENT PLACEMENT      OB History    No data available       Home Medications    Prior to Admission medications   Medication Sig Start Date End Date Taking? Authorizing Provider  ADVAIR DISKUS 250-50 MCG/DOSE AEPB Inhale 1 puff into the lungs daily as needed (shortness of breath).  08/22/14  Yes Historical Provider, MD  amLODipine (NORVASC) 5 MG tablet Take 5 mg by mouth at bedtime.   Yes Historical Provider, MD  aspirin EC 325 MG tablet Take 1 tablet (325 mg total) by mouth daily. 07/14/15  Yes Albertine Patricia, MD  atorvastatin (LIPITOR) 80 MG tablet Take 1 tablet (80 mg total) by mouth daily at 6 PM. 07/07/15  Yes Mauricio Gerome Apley, MD  clopidogrel (PLAVIX) 75 MG tablet Take 1 tablet (75 mg total) by mouth daily. 02/07/15  Yes Daniel J Angiulli, PA-C  ferrous sulfate 325 (65 FE) MG tablet Take 1 tablet (325 mg total) by mouth 2 (two) times daily with a meal. 02/07/15  Yes Daniel J Angiulli, PA-C    furosemide (LASIX) 20 MG tablet Take one tablet by mouth daily 07/22/15  Yes Historical Provider, MD  HYDROcodone-acetaminophen (NORCO/VICODIN) 5-325 MG tablet Take 1 tablet by mouth every 4 (four) hours as needed for severe pain. 09/26/15  Yes [name], MD  Insulin Glargine (LANTUS SOLOSTAR) 100 UNIT/ML Solostar Pen Inject 30 Units into the skin at bedtime.   Yes Historical Provider, MD  LYRICA 50 MG capsule Take 1 capsule (50 mg total) by mouth daily. 07/14/15  Yes Albertine Patricia, MD  metoprolol succinate (TOPROL-XL) 50 MG 24 hr tablet Take 50 mg by mouth daily. Take with or immediately following a meal.   Yes Historical Provider, MD  Oxycodone HCl 20 MG TABS Please take 10 mg oral 2 times daily, and try to wean . Patient taking differently: Take 10 mg by mouth 2 (two) times daily. Please take 10 mg oral 2 times daily, and try to wean . 07/14/15  Yes Silver Huguenin Elgergawy, MD  pantoprazole (PROTONIX) 40 MG tablet Take 40 mg by mouth daily.   Yes Historical Provider, MD  traMADol (ULTRAM) 50 MG tablet Take 1 tablet (50 mg total) by mouth every 12 (twelve) hours as needed. Patient taking differently: Take 50 mg by mouth every 12 (twelve) hours as needed for moderate pain.  11/01/15  Yes Charlett Blake, MD  glipiZIDE (GLUCOTROL) 5 MG tablet Take 1 tablet (5 mg total) by mouth daily before breakfast. Patient not taking: Reported on 11/23/2015 02/07/15   Lavon Paganini Angiulli, PA-C    Family History Family History  Problem Relation Age of Onset  . Stroke Maternal Aunt     Social History Social History  Substance Use Topics  . Smoking status: Former Research scientist (life sciences)  . Smokeless tobacco: Former Systems developer  . Alcohol use No     Allergies   Penicillins   Review of Systems Review of Systems  Unable to perform ROS: Mental status change  All other systems reviewed and are negative.    Physical Exam Updated Vital Signs BP 121/78 (BP Location: Left Arm)   Pulse 94   Temp 98.1 F (36.7 C) (Oral)    Resp 18   SpO2 98%   Physical Exam  Constitutional:  Slightly dehydrated, obese  HENT:  Head: Normocephalic and atraumatic.  Eyes: Conjunctivae are normal.  Neck: Neck supple.  Cardiovascular: Normal rate and regular rhythm.   Pulmonary/Chest: Effort normal and breath sounds normal.  Abdominal: Soft. Bowel sounds are normal.  Genitourinary:  Genitourinary Comments: Rectal exam: No masses. Heme negative  Musculoskeletal: Normal range of motion.  Neurological:  Moves all extremities sluggishly  Skin: Skin is warm and dry.  Psychiatric:  Flat affect  Nursing note and vitals reviewed.    ED Treatments / Results  Labs (all labs  ordered are listed, but only abnormal results are displayed) Labs Reviewed  CBC WITH DIFFERENTIAL/PLATELET - Abnormal; Notable for the following:       Result Value   RBC 3.02 (*)    Hemoglobin 8.7 (*)    HCT 27.5 (*)    All other components within normal limits  BASIC METABOLIC PANEL - Abnormal; Notable for the following:    Glucose, Bld 160 (*)    BUN 65 (*)    Creatinine, Ser 3.09 (*)    GFR calc non Af Amer 15 (*)    GFR calc Af Amer 17 (*)    All other components within normal limits  URINALYSIS, ROUTINE W REFLEX MICROSCOPIC (NOT AT [facility]) - Abnormal; Notable for the following:    APPearance CLOUDY (*)    Leukocytes, UA MODERATE (*)    All other components within normal limits  URINE MICROSCOPIC-ADD ON - Abnormal; Notable for the following:    Squamous Epithelial / LPF 0-5 (*)    Bacteria, UA MANY (*)    All other components within normal limits  URINE CULTURE  POC OCCULT BLOOD, ED    EKG  EKG Interpretation None       Radiology No results found.  Procedures Procedures (including critical care time)  Medications Ordered in ED Medications  sodium chloride 0.9 % bolus 500 mL (not administered)  ciprofloxacin (CIPRO) IVPB 400 mg (not administered)  sodium chloride 0.9 % bolus 500 mL (0 mLs Intravenous Stopped 11/23/15 1211)      Initial Impression / Assessment and Plan / ED Course  I have reviewed the triage vital signs and the nursing notes.  Pertinent labs & imaging results that were available during my care of the patient were reviewed by me and considered in my medical decision making (see chart for details).  Clinical Course    Family reports altered mental status. Urinalysis shows minor evidence of infection. Hemoglobin has dropped approximately 2 g recently. Rectal exam negative. CT head pending. IV Cipro initiated. Discussed with general medicine.  Final Clinical Impressions(s) / ED Diagnoses   Final diagnoses:  Confusion  Urinary tract infection without hematuria, site unspecified    New Prescriptions New Prescriptions   No medications on file     Nat Christen, MD 11/23/15 1725

## 2015-11-23 NOTE — ED Notes (Signed)
Pt continually reports that she "just wants to go home."

## 2015-11-23 NOTE — ED Notes (Signed)
Pt given ham sandwich, graham crackers, and Coke.   Pt reports that her son will come and get her.  No numbers listed in the chart except Pt's home number.  Will attempt to call that number.

## 2015-11-23 NOTE — Progress Notes (Signed)
CPAP at bedside but pt refuses to wear at this time.  Pt to notify RT if she changes her mind.  RT to monitor and assess as needed.

## 2015-11-23 NOTE — ED Notes (Signed)
Pt asked to use female urinal instead of having an In and Out.  Pt noted to have stool dried on buttocks and perineal area.  Unsuccessful attempt w/ female urinal.  Pt cleaned and new brief applied.  Breakdown noted on perineal area.  Pt reports that she has home health assistance.

## 2015-11-23 NOTE — ED Triage Notes (Signed)
Per EMS, Pt, from home, c/o low back pain x "a couple days" and increased lethargy, [place] urine, and odorous urine x 1 day.  Pain score 8/10.  Hx of frequent UTIs.  Family reported that Pt has been less mobile than usual.

## 2015-11-23 NOTE — ED Notes (Signed)
Patient transported to CT 

## 2015-11-23 NOTE — ED Notes (Signed)
Pt updated on plan of care and informed that her daughter(Tina) had spoken to EDP.  Pt sts "everytime I'm up here, they call up her lying on me.  If they were so concerned, they should be up here.  I ain't staying.  I can rest at home."  When asked, if she would agree to be admitted, Pt sts "f*ck him.  I can go home and rest."  Will notify EDP.

## 2015-11-23 NOTE — ED Notes (Signed)
Myers MD at bedside

## 2015-11-23 NOTE — H&P (Signed)
History and Physical    Michelle Holmes OJJ:[id number] DOB: [birth year] DOA: 11/23/2015  Referring MD/NP/PA: Dr. Lacinda Axon  PCP: Ricke Hey, MD   Patient coming from: From home, arrived via EMS  Chief Complaint: confusion   HPI: Michelle Holmes is a 65 y.o. female with HTN, HLD, DM type II insulin dependent, who presented via EMS with confusion, no family at bedside at the time of the arrival to the ED but daughter was present upon my arrival. Please note that pt was unable to provide much of the meaningful hx and daughter at bedside explained that mother was confused and not making sense. No specific abd or urinary concerns noted but daughter noted pt was not eating much and is not sure if she has been taking medications correctly. No reported chest pain, dyspnea, fevers.   ED Course: Pt hemodynamically stable, Blood work notable for Cr up to 3, UA worrisome for UTI. Pt started on Cipro and TRH asked to admit for further evaluation.   Review of Systems:  Unable to obtain due to altered mental status   Past Medical History:  Diagnosis Date  . Anemia   . CHF (congestive heart failure) ([place])   . Chronic back pain   . COPD (chronic obstructive pulmonary disease) ([place])   . Coronary artery disease   . Diabetes mellitus   . DJD (degenerative joint disease)   . Emphysema   . Hypercholesteremia   . Hypertension   . Myocardial infarction   . Obesity   . Obesity hypoventilation syndrome ([place])   . Renal insufficiency   . Scoliosis   . Sleep apnea   . Stroke ([place])   . TIA (transient ischemic attack)     Past Surgical History:  Procedure Laterality Date  . COLON SURGERY    . CORONARY ANGIOPLASTY WITH STENT PLACEMENT     Social hx:  reports that she has quit smoking. She has quit using smokeless tobacco. She reports that she does not drink alcohol or use drugs.  Allergies  Allergen Reactions  . Penicillins Hives and Swelling    Has patient had a PCN reaction causing immediate rash,  facial/tongue/throat swelling, SOB or lightheadedness with hypotension: yes Has patient had a PCN reaction causing severe rash involving mucus membranes or skin necrosis: no Has patient had a PCN reaction that required hospitalization no Has patient had a PCN reaction occurring within the last 10 years: no If all of the above answers are "NO", then may proceed with Cephalosporin use.     Family History  Problem Relation Age of Onset  . Stroke Maternal Aunt     Medication Sig  ADVAIR DISKUS 250-50 MCG/DOSE AEPB Inhale 1 puff into the lungs daily as needed (shortness of breath).   amLODipine (NORVASC) 5 MG tablet Take 5 mg by mouth at bedtime.  aspirin EC 325 MG tablet Take 1 tablet (325 mg total) by mouth daily.  atorvastatin (LIPITOR) 80 MG tablet Take 1 tablet (80 mg total) by mouth daily at 6 PM.  clopidogrel (PLAVIX) 75 MG tablet Take 1 tablet (75 mg total) by mouth daily.  ferrous sulfate 325 (65 FE) MG tablet Take 1 tablet (325 mg total) by mouth 2 (two) times daily with a meal.  furosemide (LASIX) 20 MG tablet Take one tablet by mouth daily  HYDROcodone-acetaminophen (NORCO/VICODIN) 5-325 MG tablet Take 1 tablet by mouth every 4 (four) hours as needed for severe pain.  Insulin Glargine (LANTUS SOLOSTAR) 100 UNIT/ML Solostar Pen Inject  30 Units into the skin at bedtime.  LYRICA 50 MG capsule Take 1 capsule (50 mg total) by mouth daily.  metoprolol succinate (TOPROL-XL) 50 MG 24 hr tablet Take 50 mg by mouth daily. Take with or immediately following a meal.  Oxycodone HCl 20 MG TABS Please take 10 mg oral 2 times daily, and try to wean . Patient taking differently: Take 10 mg by mouth 2 (two) times daily. Please take 10 mg oral 2 times daily, and try to wean .  pantoprazole (PROTONIX) 40 MG tablet Take 40 mg by mouth daily.  traMADol (ULTRAM) 50 MG tablet Take 1 tablet (50 mg total) by mouth every 12 (twelve) hours as needed. Patient taking differently: Take 50 mg by mouth every 12  (twelve) hours as needed for moderate pain.   glipiZIDE (GLUCOTROL) 5 MG tablet Take 1 tablet (5 mg total) by mouth daily before breakfast. Patient not taking: Reported on 11/23/2015    Physical Exam: Vitals:   11/23/15 1026 11/23/15 1257 11/23/15 1554 11/23/15 1730  BP: 108/63 127/74 121/78 141/84  Pulse: 95 89 94 95  Resp: 18 18 18    Temp: 98.2 F (36.8 C) 98.1 F (36.7 C)    TempSrc: Oral Oral Oral   SpO2: 96% 91% 98% 97%    Constitutional: NAD, calm, confused at times during the interview  Vitals:   11/23/15 1026 11/23/15 1257 11/23/15 1554 11/23/15 1730  BP: 108/63 127/74 121/78 141/84  Pulse: 95 89 94 95  Resp: 18 18 18    Temp: 98.2 F (36.8 C) 98.1 F (36.7 C)    TempSrc: Oral Oral Oral   SpO2: 96% 91% 98% 97%   Eyes: PERRL, lids and conjunctivae normal ENMT: Mucous membranes are dry. Posterior pharynx clear of any exudate or lesions.Normal dentition.  Neck: normal, supple, no masses, no thyromegaly Respiratory: clear to auscultation bilaterally, no wheezing, no crackles. Normal respiratory effort.  Cardiovascular: Regular rate and rhythm, no murmurs / rubs / gallops. No extremity edema. 2+ pedal pulses.  Abdomen: no tenderness, no masses palpated. No hepatosplenomegaly. Bowel sounds positive.  Musculoskeletal: no clubbing / cyanosis. No joint deformity upper and lower extremities. Good ROM, no contractures.  Skin: no rashes, lesions, ulcers. No induration Neurologic: CN 2-12 grossly intact. Sensation intact, DTR normal. Strength 5/5 in all 4.  Psychiatric: Difficult to assess as pt with confusion   Labs on Admission: I have personally reviewed following labs and imaging studies  CBC:  Recent Labs Lab 11/23/15 1100  WBC 7.3  NEUTROABS 4.8  HGB 8.7*  HCT 27.5*  MCV 91.1  PLT 188   Basic Metabolic Panel:  Recent Labs Lab 11/23/15 1100  NA 142  K 4.2  CL 111  CO2 22  GLUCOSE 160*  BUN 65*  CREATININE 3.09*  CALCIUM 9.0   Urine analysis:      Component Value Date/Time   COLORURINE YELLOW 11/23/2015 1231   APPEARANCEUR CLOUDY (A) 11/23/2015 1231   LABSPEC 1.017 11/23/2015 1231   PHURINE 5.0 11/23/2015 1231   GLUCOSEU NEGATIVE 11/23/2015 1231   HGBUR NEGATIVE 11/23/2015 1231   BILIRUBINUR NEGATIVE 11/23/2015 1231   [place] 11/23/2015 1231   PROTEINUR NEGATIVE 11/23/2015 1231   UROBILINOGEN 0.2 06/20/2014 0808   NITRITE NEGATIVE 11/23/2015 1231   LEUKOCYTESUR MODERATE (A) 11/23/2015 1231   Radiological Exams on Admission: No results found.  EKG: pending   Assessment/Plan Acute encephalopathy - appears to be multifactorial and secondary to UTI, ? Dehydration  - agree with admission for  further evaluation - will treat empirically with Cipro and follow up on urine cultures  - will also place on IVF gentle hydration and reassess in AM - PT eval prior to discharge   UTI - treating with Cipro for now - follow up on urine cultures  Acute renal failure imposed on CKD stage III - IV - review of records indicate that GFR has been in 20 -30 with Cr ~ 2.5 - will hold Lasix for now and place on gentle hydration  - repeat BMP in AM  HTN, essential - stable BP on admission - continue Norvasc, Metoprolol   DM type II with complications of nephropathy - continue home medical regimen - add SSI   HLD - continue statin    DVT prophylaxis: Heparin SQ Code Status: DNR Family Communication: Pt updated at bedside, daughter at bedside  Disposition Plan: Home in few days Consults called: None Admission status: Inpatient   Faye Ramsay MD [facility] Pager [phone]  If 7PM-7AM, please contact night-coverage [email or web address] Password [email or web address]  11/23/2015, 5:35 PM

## 2015-11-24 ENCOUNTER — Inpatient Hospital Stay (HOSPITAL_COMMUNITY): Payer: Medicare Other

## 2015-11-24 DIAGNOSIS — G9341 Metabolic encephalopathy: Secondary | ICD-10-CM

## 2015-11-24 DIAGNOSIS — L8990 Pressure ulcer of unspecified site, unspecified stage: Secondary | ICD-10-CM | POA: Insufficient documentation

## 2015-11-24 DIAGNOSIS — N390 Urinary tract infection, site not specified: Secondary | ICD-10-CM

## 2015-11-24 LAB — BASIC METABOLIC PANEL
Anion gap: 10 (ref 5–15)
BUN: 69 mg/dL — ABNORMAL HIGH (ref 6–20)
CO2: 19 mmol/L — ABNORMAL LOW (ref 22–32)
Calcium: 9.1 mg/dL (ref 8.9–10.3)
Chloride: 112 mmol/L — ABNORMAL HIGH (ref 101–111)
Creatinine, Ser: 3.76 mg/dL — ABNORMAL HIGH (ref 0.44–1.00)
GFR calc Af Amer: 14 mL/min — ABNORMAL LOW (ref >60)
GFR calc non Af Amer: 12 mL/min — ABNORMAL LOW (ref >60)
Glucose, Bld: 87 mg/dL (ref 65–99)
Potassium: 4.5 mmol/L (ref 3.5–5.1)
Sodium: 141 mmol/L (ref 135–145)

## 2015-11-24 LAB — CBC
HCT: 28.2 % — ABNORMAL LOW (ref 36.0–46.0)
Hemoglobin: 8.9 g/dL — ABNORMAL LOW (ref 12.0–15.0)
MCH: 28.3 pg (ref 26.0–34.0)
MCHC: 31.6 g/dL (ref 30.0–36.0)
MCV: 89.5 fL (ref 78.0–100.0)
Platelets: 224 10*3/uL (ref 150–400)
RBC: 3.15 MIL/uL — ABNORMAL LOW (ref 3.87–5.11)
RDW: 14 % (ref 11.5–15.5)
WBC: 10 10*3/uL (ref 4.0–10.5)

## 2015-11-24 LAB — TSH: TSH: 2.203 u[IU]/mL (ref 0.350–4.500)

## 2015-11-24 LAB — GLUCOSE, CAPILLARY
Glucose-Capillary: 87 mg/dL (ref 65–99)
Glucose-Capillary: 106 mg/dL — ABNORMAL HIGH (ref 65–99)
Glucose-Capillary: 93 mg/dL (ref 65–99)
Glucose-Capillary: 144 mg/dL — ABNORMAL HIGH (ref 65–99)

## 2015-11-24 MED ORDER — SODIUM CHLORIDE 0.9 % IV BOLUS (SEPSIS)
500.0000 mL | Freq: Once | INTRAVENOUS | Status: AC
  Administered 2015-11-24: 23:00:00 via INTRAVENOUS

## 2015-11-24 MED ORDER — ACETAMINOPHEN 325 MG PO TABS
650.0000 mg | ORAL_TABLET | ORAL | Status: DC | PRN
  Administered 2015-11-24 – 2015-11-25 (×3): 650 mg via ORAL
  Filled 2015-11-24 (×3): qty 2

## 2015-11-24 MED ORDER — CIPROFLOXACIN HCL 500 MG PO TABS
500.0000 mg | ORAL_TABLET | Freq: Every day | ORAL | Status: DC
  Administered 2015-11-25 – 2015-11-29 (×5): 500 mg via ORAL
  Filled 2015-11-24 (×5): qty 1

## 2015-11-24 NOTE — Progress Notes (Signed)
CSW met with pt to discuss PT recommendation for SNF- pt states she has been to SNF before and is not interested in going back to SNF- would like to return home with home services/equipment.  Pt states she lives at home and a caregiver and caregivers spouse lives with her- has no concerns with her care at home  CSW informed RNCM of pt desire to return home- CSW signing off  Jorge Ny MSW, LCSW [facility] #: [phone]

## 2015-11-24 NOTE — Progress Notes (Signed)
PROGRESS NOTE    Michelle Holmes  BZJ:[id number] DOB: [birth year] DOA: 11/23/2015 PCP: Ricke Hey, MD   Brief Narrative: 65 y.o. female with HTN, HLD, DM type II insulin dependent, who presented via EMS with confusion. Found to have UTI and acute on chronic kidney disease.  Assessment & Plan:   # Acute encephalopathy likely in the setting of UTI and renal failure: Patient has no focal neurological deficit. CT scan of head showed no acute finding. TSH level acceptable. Patient's mental status seems improving. Continue to monitor. PT, OT evaluation.  #Acute cystitis without hematuria: Continue with ciprofloxacin. Prior urine culture grew enterococcus. Follow up culture results.  #Acute on chronic kidney disease is stage III: Holding Lasix. Continue hydration. Monitor BMP. Treat UTI. Check bladder scan to make sure patient has no urinary retention. If no improvement in her function, I will consider kidney ultrasound. Avoid nephrotoxins.  #  Pressure injury of skin, on the back: Supportive care  #Essential hypertension: Monitor blood pressure. Continue Norvasc and metoprolol.  #Type 2 diabetes on chronic insulin: Monitor blood sugar level. Change to carb modified diet. Continue current insulin regimen.  Continue current medical and supportive care.  DVT prophylaxis: Heparin subcutaneous Code Status: Full code Family Communication: No family present at bedside Disposition Plan: Likely discharged home with home care. Patient is declining SNF.    Consultants:   None  Procedures: None Antimicrobials: Ciprofloxacin started since October 29  Subjective: Patient was seen and examined at bedside. Patient was alert awake, oriented to [facility], name and 2017. Reported increased urinary frequency but denied dysuria or urgency. Denied fever, chills, nausea, vomiting, abdominal pain, chest pain or shortness of breath.   Objective: Vitals:   11/24/15 0451 11/24/15 0930  11/24/15 1001 11/24/15 1006  BP: (!) 143/82 109/68 (!) 128/58 (!) 121/58  Pulse: 85 88 88   Resp: 19 20 20    Temp: 99.2 F (37.3 C) 98.1 F (36.7 C)    TempSrc: Oral Oral    SpO2: 98%  97%     Intake/Output Summary (Last 24 hours) at 11/24/15 1241 Last data filed at 11/24/15 0100  Gross per 24 hour  Intake            477.5 ml  Output                0 ml  Net            477.5 ml   Filed Weights    Examination:  General exam: Appears calm and comfortable  Respiratory system: Clear to auscultation. Respiratory effort normal. No wheezing or crackle Cardiovascular system: S1 & S2 heard, RRR.  No pedal edema. Gastrointestinal system: Abdomen is nondistended, soft and nontender. Normal bowel sounds heard. Central nervous system: Alert, Awake. No focal neurological deficits. Extremities: Symmetric 5 x 5 power. Skin: No rashes, lesions or ulcers Psychiatry: Judgement and insight appear normal. Mood & affect appropriate.     Data Reviewed: I have personally reviewed following labs and imaging studies  CBC:  Recent Labs Lab 11/23/15 1100 11/24/15 0515  WBC 7.3 10.0  NEUTROABS 4.8  --   HGB 8.7* 8.9*  HCT 27.5* 28.2*  MCV 91.1 89.5  PLT 227 017   Basic Metabolic Panel:  Recent Labs Lab 11/23/15 1100 11/24/15 0515  NA 142 141  K 4.2 4.5  CL 111 112*  CO2 22 19*  GLUCOSE 160* 87  BUN 65* 69*  CREATININE 3.09* 3.76*  CALCIUM 9.0 9.1  GFR: CrCl cannot be calculated (Unknown ideal weight.). Liver Function Tests: No results for input(s): AST, ALT, ALKPHOS, BILITOT, PROT, ALBUMIN in the last 168 hours. No results for input(s): LIPASE, AMYLASE in the last 168 hours. No results for input(s): AMMONIA in the last 168 hours. Coagulation Profile: No results for input(s): INR, PROTIME in the last 168 hours. Cardiac Enzymes: No results for input(s): CKTOTAL, CKMB, CKMBINDEX, TROPONINI in the last 168 hours. BNP (last 3 results) No results for input(s): PROBNP in the  last 8760 hours. HbA1C: No results for input(s): HGBA1C in the last 72 hours. CBG:  Recent Labs Lab 11/23/15 2117 11/24/15 0908 11/24/15 1227  GLUCAP 128* 87 106*   Lipid Profile: No results for input(s): CHOL, HDL, LDLCALC, TRIG, CHOLHDL, LDLDIRECT in the last 72 hours. Thyroid Function Tests:  Recent Labs  11/24/15 0515  TSH 2.203   Anemia Panel: No results for input(s): VITAMINB12, FOLATE, FERRITIN, TIBC, IRON, RETICCTPCT in the last 72 hours. Sepsis Labs: No results for input(s): PROCALCITON, LATICACIDVEN in the last 168 hours.  No results found for this or any previous visit (from the past 240 hour(s)).       Radiology Studies: Ct Head Wo Contrast  Result Date: 11/23/2015 CLINICAL DATA:  Altered mental status. EXAM: CT HEAD WITHOUT CONTRAST TECHNIQUE: Contiguous axial images were obtained from the base of the skull through the vertex without intravenous contrast. COMPARISON:  CT scan of September 26, 2015. 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 Mild chronic ischemic white matter disease is noted. No mass effect or midline shift is noted. Ventricular size is within normal limits. There is no evidence of mass lesion, hemorrhage or acute infarction. Vascular: Atherosclerosis of carotid siphons is noted. Skull: Bony calvarium appears intact. Sinuses/Orbits: Paranasal sinuses are unremarkable. Other: None. IMPRESSION: Mild chronic ischemic white matter disease. 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 No acute intracranial abnormality seen. Electronically Signed   By: Marijo Conception, M.D.   On: 11/23/2015 17:57        Scheduled Meds: . amLODipine  5 mg Oral QHS  . aspirin EC  81 mg Oral Daily  . ciprofloxacin  400 mg Intravenous Q12H  . clopidogrel  75 mg Oral Daily  . ferrous sulfate  325 mg Oral BID WC  . heparin  5,000 Units Subcutaneous Q8H  . insulin aspart  0-9 Units Subcutaneous TID WC  . insulin glargine  10 Units Subcutaneous QHS  . metoprolol succinate   50 mg Oral Daily  . mometasone-formoterol  2 puff Inhalation BID  . pantoprazole  40 mg Oral Daily  . pregabalin  50 mg Oral Daily  . sodium chloride flush  3 mL Intravenous Q12H   Continuous Infusions: . sodium chloride 75 mL/hr at 11/23/15 2118     LOS: 1 day    Time spent: 27 minutes    Joyce Heitman Tanna Furry, MD [facility] Pager [phone]  If 7PM-7AM, please contact night-coverage [email or web address] Password [email or web address] 11/24/2015, 12:41 PM

## 2015-11-24 NOTE — Progress Notes (Signed)
NT went to perform bladder scan on pt. Pt adamantly refused to lay flat for procedure, cursing at NT. Will try later. Sakib Noguez, [employer]

## 2015-11-24 NOTE — Progress Notes (Signed)
PHARMACIST - PHYSICIAN COMMUNICATION DR:   Carolin Sicks CONCERNING: Antibiotic IV to Oral Route Change Policy  RECOMMENDATION: This patient is receiving Cipro by the intravenous route.  Based on criteria approved by the Pharmacy and Therapeutics Committee, the antibiotic(s) is/are being converted to the equivalent oral dose form(s).   DESCRIPTION: These criteria include:  Patient being treated for a respiratory tract infection, urinary tract infection, cellulitis or clostridium difficile associated diarrhea if on metronidazole  The patient is not neutropenic and does not exhibit a GI malabsorption state  The patient is eating (either orally or via tube) and/or has been taking other orally administered medications for a least 24 hours  The patient is improving clinically and has a Tmax < 100.5  **Also, adjusting Cipro to q24h dosing for CrCl <25ml/min per manufacturer recommendations.  If you have questions about this conversion, please contact the Pharmacy Department  []   [phone] )  [facility] []   [phone] )  [facility] []   [phone] )  [facility] []   [phone] )  [facility] [x]   [phone] )  [place], PharmD, [place] Pager: [phone] 11/24/2015@1 :04 PM

## 2015-11-24 NOTE — Progress Notes (Signed)
Patient refused to stand for orthostatic BP.

## 2015-11-24 NOTE — Evaluation (Signed)
Physical Therapy Evaluation Patient Details Name: Michelle Holmes MRN: [id number] DOB: April 02, 1950 Today's Date: 11/24/2015   History of Present Illness  65 yo female admitted with acute renal failure, confusion. 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 Pt would not put forth much effort despite cueing from therapist. Pt stated "I don't usually help. They (family)  just do it."  Pt's limited/lack of participation and c/o back pain with attempts to sit EOB limited session. Assisted pt back to bed at her request. Made RN aware of need for pain meds. Spoke with NT- recommended use of lift equipment for OOB<>chair. Pt will likely refuse placement and return home with family.     Follow Up Recommendations SNF;Supervision/Assistance - 24 hour (unless family can continue to provide care. If pt returns home then HHPT)    Equipment Recommendations  None recommended by PT    Recommendations for Other Services       Precautions / Restrictions Precautions Precautions: Fall Precaution Comments: R hemiparesis Restrictions Weight Bearing Restrictions: No      Mobility  Bed Mobility Overal bed mobility: Needs Assistance;+2 for physical assistance;+ 2 for safety/equipment             General bed mobility comments: Attempted multiple times to assist pt to EOB. Max cues for pt to assist as much as possible. Pt would reply "okay" but she would not put forth any effort to mobillize. Pt alos c/o back pain with each attempt to assist trunk to upright. Returned pt to supine and repositioned with NTs assistance.  Transfers                 General transfer comment: NT  Ambulation/Gait                Stairs            Wheelchair Mobility    Modified Rankin (Stroke Patients Only)       Balance                                              Pertinent Vitals/Pain Pain Assessment: Faces Faces Pain Scale: Hurts whole lot Pain Location: back pain with activity Pain Descriptors / Indicators: Grimacing Pain Intervention(s): Repositioned;Patient requesting pain meds-RN notified    Home Living Family/patient expects to be discharged to:: Private residence Living Arrangements: Children   Type of Home: House Home Access: Ramped entrance     Home Layout: One level Home Equipment: Wheelchair - manual;Hospital bed;Walker - 2 wheels;Cane - single point;Bedside commode;Shower seat      Prior Function Level of Independence: Needs assistance   Gait / Transfers Assistance Needed: assist from family for mobility and transfers in and out of WC     Comments: Unsure of accuracy of responses. No family present     Hand Dominance        Extremity/Trunk Assessment   Upper Extremity Assessment: RUE deficits/detail RUE Deficits / Details: No functional use of R UE         Lower Extremity Assessment: RLE deficits/detail RLE Deficits / Details: R hemiparesis       Communication   Communication: Expressive difficulties  Cognition Arousal/Alertness: Awake/alert Behavior During Therapy: WFL for tasks assessed/performed  Overall Cognitive Status: No family/caregiver present to determine baseline cognitive functioning                      General Comments      Exercises     Assessment/Plan    PT Assessment Patient needs continued PT services  PT Problem List Decreased strength;Decreased mobility;Decreased range of motion;Decreased activity tolerance;Decreased balance;Pain;Decreased knowledge of use of DME;Decreased cognition;Decreased coordination;Obesity          PT Treatment Interventions DME instruction;Therapeutic activities;Therapeutic exercise;Patient/family education;Balance training;Functional mobility training    PT Goals (Current goals can be found in the Care Plan section)  Acute  Rehab PT Goals Patient Stated Goal: home. less pain. PT Goal Formulation: Patient unable to participate in goal setting Time For Goal Achievement: 12/08/15 Potential to Achieve Goals: Fair    Frequency Min 3X/week   Barriers to discharge        Co-evaluation               End of Session   Activity Tolerance: Patient limited by pain (Limited by pt's unwillingness to put forth effort to assist with mobility) Patient left: in bed;with call bell/phone within reach;with bed alarm set           Time: 0922-0951 PT Time Calculation (min) (ACUTE ONLY): 29 min   Charges:   PT Evaluation $PT Eval Low Complexity: 1 Procedure PT Treatments $Therapeutic Activity: 8-22 mins   PT G Codes:        Michelle Holmes, MPT Pager: [phone]

## 2015-11-24 NOTE — Progress Notes (Signed)
Pt temp 103.1 oral; no Tylenol orders. Cool cloth placed. Provider on call paged. Will continue to monitor.

## 2015-11-24 NOTE — Progress Notes (Signed)
Bolus started per orders, Tylenol given. Xray in room for xray. Will recheck temp and continue to monitor.

## 2015-11-25 ENCOUNTER — Inpatient Hospital Stay (HOSPITAL_COMMUNITY): Payer: Medicare Other

## 2015-11-25 DIAGNOSIS — N179 Acute kidney failure, unspecified: Secondary | ICD-10-CM

## 2015-11-25 DIAGNOSIS — D638 Anemia in other chronic diseases classified elsewhere: Secondary | ICD-10-CM

## 2015-11-25 DIAGNOSIS — D631 Anemia in chronic kidney disease: Secondary | ICD-10-CM

## 2015-11-25 DIAGNOSIS — D649 Anemia, unspecified: Secondary | ICD-10-CM

## 2015-11-25 DIAGNOSIS — R338 Other retention of urine: Secondary | ICD-10-CM

## 2015-11-25 DIAGNOSIS — N189 Chronic kidney disease, unspecified: Secondary | ICD-10-CM

## 2015-11-25 LAB — CBC
HCT: 23.6 % — ABNORMAL LOW (ref 36.0–46.0)
Hemoglobin: 7.6 g/dL — ABNORMAL LOW (ref 12.0–15.0)
MCH: 29.3 pg (ref 26.0–34.0)
MCHC: 32.2 g/dL (ref 30.0–36.0)
MCV: 91.1 fL (ref 78.0–100.0)
Platelets: 171 10*3/uL (ref 150–400)
RBC: 2.59 MIL/uL — ABNORMAL LOW (ref 3.87–5.11)
RDW: 14 % (ref 11.5–15.5)
WBC: 12.3 10*3/uL — ABNORMAL HIGH (ref 4.0–10.5)

## 2015-11-25 LAB — IRON AND TIBC
Iron: 10 ug/dL — ABNORMAL LOW (ref 28–170)
Saturation Ratios: 6 % — ABNORMAL LOW (ref 10.4–31.8)
TIBC: 157 ug/dL — ABNORMAL LOW (ref 250–450)
UIBC: 147 ug/dL

## 2015-11-25 LAB — BASIC METABOLIC PANEL
Anion gap: 8 (ref 5–15)
BUN: 80 mg/dL — ABNORMAL HIGH (ref 6–20)
CO2: 18 mmol/L — ABNORMAL LOW (ref 22–32)
Calcium: 8.4 mg/dL — ABNORMAL LOW (ref 8.9–10.3)
Chloride: 114 mmol/L — ABNORMAL HIGH (ref 101–111)
Creatinine, Ser: 4.61 mg/dL — ABNORMAL HIGH (ref 0.44–1.00)
GFR calc Af Amer: 11 mL/min — ABNORMAL LOW (ref >60)
GFR calc non Af Amer: 9 mL/min — ABNORMAL LOW (ref >60)
Glucose, Bld: 147 mg/dL — ABNORMAL HIGH (ref 65–99)
Potassium: 4.9 mmol/L (ref 3.5–5.1)
Sodium: 140 mmol/L (ref 135–145)

## 2015-11-25 LAB — GLUCOSE, CAPILLARY
Glucose-Capillary: 133 mg/dL — ABNORMAL HIGH (ref 65–99)
Glucose-Capillary: 99 mg/dL (ref 65–99)
Glucose-Capillary: 97 mg/dL (ref 65–99)
Glucose-Capillary: 127 mg/dL — ABNORMAL HIGH (ref 65–99)

## 2015-11-25 LAB — URINE CULTURE: Culture: <10000 — AB

## 2015-11-25 LAB — RETICULOCYTES
RBC.: 2.62 MIL/uL — ABNORMAL LOW (ref 3.87–5.11)
Retic Count, Absolute: 52.4 10*3/uL (ref 19.0–186.0)
Retic Ct Pct: 2 % (ref 0.4–3.1)

## 2015-11-25 LAB — HEMOGLOBIN A1C
Hgb A1c MFr Bld: 6.6 % — ABNORMAL HIGH (ref 4.8–5.6)
Mean Plasma Glucose: 143 mg/dL

## 2015-11-25 LAB — FERRITIN: Ferritin: 218 ng/mL (ref 11–307)

## 2015-11-25 NOTE — Progress Notes (Signed)
PROGRESS NOTE    Michelle Holmes  XIP:[id number] DOB: 1950-04-27 DOA: 11/23/2015 PCP: Ricke Hey, MD   Brief Narrative: 65 y.o. female with HTN, HLD, DM type II insulin dependent, who presented via EMS with confusion. Found to have UTI and acute on chronic kidney disease.  Assessment & Plan:   # Acute encephalopathy likely in the setting of UTI and renal failure: Patient has no focal neurological deficit. CT scan of head showed no acute finding. TSH level acceptable. Patient's mental status seems improving. Continue to monitor. -PT, OT evaluation. PT recommended SNF and 24-hour supervision. I consulted social worker for safe discharge planning.  #Acute cystitis without hematuria: Continue with ciprofloxacin. Prior urine culture grew enterococcus. Follow up culture results. Had fever yesterday. Continue to trend his fever curve.  #Acute on chronic kidney disease is stage III likely in the setting of acute urinary retention: Holding Lasix.  -Fully catheter inserted yesterday with removal of 2.7 L of urine. In 24 hours patient has output of 7 liters. Ultrasound of kidney reviewed consistent with nephrolithiasis without hydronephrosis. Continue Foley catheter. Serum creatinine level elevated today, increased the rate of IV fluid. Encourage oral intake. Continue to treat UTI. -Avoid nephrotoxins. -Urology consult called.  # Normocytic anemia likely due to renal failure: drop in Hb without any sign of bleeding. Check iron stores, retics and stool occult blood test. Monitor CBC.  #  Pressure injury of skin, on the back: Supportive care  #Essential hypertension: Monitor blood pressure. Continue Norvasc and metoprolol. Blood pressure acceptable.  #Type 2 diabetes on chronic insulin: Monitor blood sugar level. Change to carb modified diet. Continue current insulin regimen.  Continue current medical and supportive care.  DVT prophylaxis: Heparin subcutaneous Code Status: Full code Family  Communication: No family present at bedside Disposition Plan: Likely discharged home with home care versus SNF.    Consultants:   None  Procedures: None Antimicrobials: Ciprofloxacin started since October 29  Subjective: Patient was seen and examined at bedside. Patient feels weak. She was alert, awake and oriented to hospital and name. Not to date. She denied headache, nausea, vomiting, abdomen pain, chest pain and SOB.    Objective: Vitals:   11/24/15 2300 11/25/15 0604 11/25/15 0653 11/25/15 1133  BP:  130/72    Pulse:  81    Resp:  18    Temp: 100 F (37.8 C) (!) 101.3 F (38.5 C) 99.6 F (37.6 C)   TempSrc: Oral Oral Oral   SpO2:  91%  92%    Intake/Output Summary (Last 24 hours) at 11/25/15 1202 Last data filed at 11/25/15 1046  Gross per 24 hour  Intake             1630 ml  Output             7050 ml  Net            -5420 ml   Filed Weights    Examination:  General exam: NAD, lying on bed comfortable. Respiratory system: CTA b/l, no wheeze or crackle Cardiovascular system: RRR, s1s2 nl. No murmur Gastrointestinal system: Abdomen is soft, nondistended and nontender. Normal bowel sounds heard. Central nervous system: Alert, Awake, following commands. No focal neurological deficits. Extremities: Symmetric 5 x 5 power. Skin: No rashes, lesions or ulcers Psychiatry: Judgement and insight appear normal. Mood & affect appropriate.  Gu: Foley catheter and bag with clear urine    Data Reviewed: I have personally reviewed following labs and imaging studies  CBC:  Recent Labs Lab 11/23/15 1100 11/24/15 0515 11/25/15 0459  WBC 7.3 10.0 12.3*  NEUTROABS 4.8  --   --   HGB 8.7* 8.9* 7.6*  HCT 27.5* 28.2* 23.6*  MCV 91.1 89.5 91.1  PLT 227 224 329   Basic Metabolic Panel:  Recent Labs Lab 11/23/15 1100 11/24/15 0515 11/25/15 0459  NA 142 141 140  K 4.2 4.5 4.9  CL 111 112* 114*  CO2 22 19* 18*  GLUCOSE 160* 87 147*  BUN 65* 69* 80*  CREATININE  3.09* 3.76* 4.61*  CALCIUM 9.0 9.1 8.4*   GFR: CrCl cannot be calculated (Unknown ideal weight.). Liver Function Tests: No results for input(s): AST, ALT, ALKPHOS, BILITOT, PROT, ALBUMIN in the last 168 hours. No results for input(s): LIPASE, AMYLASE in the last 168 hours. No results for input(s): AMMONIA in the last 168 hours. Coagulation Profile: No results for input(s): INR, PROTIME in the last 168 hours. Cardiac Enzymes: No results for input(s): CKTOTAL, CKMB, CKMBINDEX, TROPONINI in the last 168 hours. BNP (last 3 results) No results for input(s): PROBNP in the last 8760 hours. HbA1C:  Recent Labs  11/24/15 0515  HGBA1C 6.6*   CBG:  Recent Labs Lab 11/24/15 1227 11/24/15 1706 11/24/15 2054 11/25/15 0713 11/25/15 1154  GLUCAP 106* 93 144* 133* 99   Lipid Profile: No results for input(s): CHOL, HDL, LDLCALC, TRIG, CHOLHDL, LDLDIRECT in the last 72 hours. Thyroid Function Tests:  Recent Labs  11/24/15 0515  TSH 2.203   Anemia Panel: No results for input(s): VITAMINB12, FOLATE, FERRITIN, TIBC, IRON, RETICCTPCT in the last 72 hours. Sepsis Labs: No results for input(s): PROCALCITON, LATICACIDVEN in the last 168 hours.  Recent Results (from the past 240 hour(s))  Urine culture     Status: Abnormal   Collection Time: 11/23/15 12:31 PM  Result Value Ref Range Status   Specimen Description URINE, RANDOM  Final   Special Requests NONE  Final   Culture (A)  Final    <10,000 COLONIES/mL INSIGNIFICANT GROWTH Performed at [facility]    Report Status 11/25/2015 FINAL  Final         Radiology Studies: Ct Head Wo Contrast  Result Date: 11/23/2015 CLINICAL DATA:  Altered mental status. EXAM: CT HEAD WITHOUT CONTRAST TECHNIQUE: Contiguous axial images were obtained from the base of the skull through the vertex without intravenous contrast. COMPARISON:  CT scan of September 26, 2015. 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 Mild chronic  ischemic white matter disease is noted. No mass effect or midline shift is noted. Ventricular size is within normal limits. There is no evidence of mass lesion, hemorrhage or acute infarction. Vascular: Atherosclerosis of carotid siphons is noted. Skull: Bony calvarium appears intact. Sinuses/Orbits: Paranasal sinuses are unremarkable. Other: None. IMPRESSION: Mild chronic ischemic white matter disease. 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 No acute intracranial abnormality seen. Electronically Signed   By: Marijo Conception, M.D.   On: 11/23/2015 17:57   US Renal  Result Date: 11/25/2015 CLINICAL DATA:  Renal failure EXAM: RENAL / URINARY TRACT ULTRASOUND COMPLETE COMPARISON:  CT abdomen/pelvis dated 03/21/2015 FINDINGS: Right Kidney: Length: 9.2 cm.  9 mm upper pole calculus.  No hydronephrosis. Left Kidney: Length: 11.5 cm.  8 mm upper pole calculus.  No hydronephrosis. Bladder: Decompressed by indwelling Foley catheter. IMPRESSION: Bilateral renal calculi, measuring up to 9 mm.  No hydronephrosis. Bladder decompressed by indwelling Foley catheter. Electronically Signed   By: Julian Hy M.D.   On: 11/25/2015 09:17  Dg Chest Port 1 View  Result Date: 11/24/2015 CLINICAL DATA:  Acute onset of fever.  Initial encounter. EXAM: PORTABLE CHEST 1 VIEW COMPARISON:  Chest radiograph performed 07/04/2015 FINDINGS: The lungs are mildly hypoexpanded. Vascular congestion is noted. Mild bibasilar opacities may reflect atelectasis or pneumonia, depending on the patient's symptoms. There is no evidence of pleural effusion or pneumothorax. The cardiomediastinal silhouette is borderline enlarged. No acute osseous abnormalities are seen. Chronic degenerative change is noted at the left glenohumeral joint, with superior subluxation of the left humeral head. IMPRESSION: Lungs mildly hypoexpanded. Vascular congestion and borderline cardiomegaly. Mild bibasilar opacities may reflect atelectasis or possibly pneumonia, depending  on the patient's symptoms. Electronically Signed   By: Garald Balding M.D.   On: 11/24/2015 23:03        Scheduled Meds: . amLODipine  5 mg Oral QHS  . aspirin EC  81 mg Oral Daily  . ciprofloxacin  500 mg Oral Q breakfast  . clopidogrel  75 mg Oral Daily  . ferrous sulfate  325 mg Oral BID WC  . heparin  5,000 Units Subcutaneous Q8H  . insulin aspart  0-9 Units Subcutaneous TID WC  . insulin glargine  10 Units Subcutaneous QHS  . metoprolol succinate  50 mg Oral Daily  . mometasone-formoterol  2 puff Inhalation BID  . pantoprazole  40 mg Oral Daily  . pregabalin  50 mg Oral Daily  . sodium chloride flush  3 mL Intravenous Q12H   Continuous Infusions: . sodium chloride 125 mL/hr at 11/25/15 0959     LOS: 2 days    Time spent:30 minutes    Annleigh Knueppel Tanna Furry, MD [facility] Pager [phone]  If 7PM-7AM, please contact night-coverage [email or web address] Password [email or web address] 11/25/2015, 12:02 PM

## 2015-11-25 NOTE — Consult Note (Signed)
Urology Consult   Physician requesting consult: Lawson Radar  Reason for consult: Urinary retention   History of Present Illness: Michelle Holmes is a 65 y.o. female with PMH significant for HTN, CHF, COPD, insulin dependent DM II, CAD, CVA, and chronic renal insufficiency who presented to the ED on 11/23/15 with confusion.  She was admitted for treatment of UTI, acute encephalopathy, and acute on chronic renal insufficiency.  Per RN, she was noted to have little UO yesterday and a bladder scan was obtained.  This lead to a foley being placed with 2.7L of urine returned.  She was not complaining of pain or the urge to void prior to foley placement.  Renal U/S revealed no hydro but an 18mm left upper pole stone and a right 43mm upper pole stone.  Cr was 3.09 on admission and has risen to 4.61 today.  She had been on Lasix at home (currently being held) and she is receiving IVFs.  UA on admission nitrite negative, many bacteria, and 6-30 WBCs.  Urine culture reveals <10,000 colonies with insignificant growth.  She was started on Cipro based on a previous culture from 06/2015 that grew Enterococcus. Her temp was up to 103 yesterday but she has been AF today. WBC 7.3 on admission and 12.3 today.  CXR yesterday reveals bibasilar opacities that may reflect atelectasis vs PNA.   Per the RN the pt was more alert and less confused this morning.  However, per her and the pts daughter she did not sleep with her CPAP last night and is very sleepy/difficult to arouse this afternoon.  She was also vomiting earlier today.  Per the pts daughter she has no history of voiding or storage urinary symptoms, hematuria, urolithiasis, or GU malignancy/trauma/surgery. Her daughter does state that last Friday when she was bathing her mother she noted she had leaked urine on herself which was very unusual and the pt was unaware of it. The daughter reports that the pt has a hx of poorly controlled blood sugars.    Past Medical History:   Diagnosis Date  . Anemia   . CHF (congestive heart failure) ([place])   . Chronic back pain   . COPD (chronic obstructive pulmonary disease) ([place])   . Coronary artery disease   . Diabetes mellitus   . DJD (degenerative joint disease)   . Emphysema   . Hypercholesteremia   . Hypertension   . Myocardial infarction   . Obesity   . Obesity hypoventilation syndrome ([place])   . Renal insufficiency   . Scoliosis   . Sleep apnea   . Stroke ([place])   . TIA (transient ischemic attack)     Past Surgical History:  Procedure Laterality Date  . COLON SURGERY    . CORONARY ANGIOPLASTY WITH STENT PLACEMENT       Current Hospital Medications:  Scheduled Meds: . amLODipine  5 mg Oral QHS  . aspirin EC  81 mg Oral Daily  . ciprofloxacin  500 mg Oral Q breakfast  . clopidogrel  75 mg Oral Daily  . ferrous sulfate  325 mg Oral BID WC  . heparin  5,000 Units Subcutaneous Q8H  . insulin aspart  0-9 Units Subcutaneous TID WC  . insulin glargine  10 Units Subcutaneous QHS  . metoprolol succinate  50 mg Oral Daily  . mometasone-formoterol  2 puff Inhalation BID  . pantoprazole  40 mg Oral Daily  . pregabalin  50 mg Oral Daily  . sodium chloride flush  3  mL Intravenous Q12H   Continuous Infusions: . sodium chloride 125 mL/hr at 11/25/15 0959   PRN Meds:.acetaminophen, HYDROcodone-acetaminophen, HYDROmorphone (DILAUDID) injection, ondansetron **OR** ondansetron (ZOFRAN) IV, traZODone  Allergies:  Allergies  Allergen Reactions  . Penicillins Hives and Swelling    Has patient had a PCN reaction causing immediate rash, facial/tongue/throat swelling, SOB or lightheadedness with hypotension: yes Has patient had a PCN reaction causing severe rash involving mucus membranes or skin necrosis: no Has patient had a PCN reaction that required hospitalization no Has patient had a PCN reaction occurring within the last 10 years: no If all of the above answers are "NO", then may proceed with Cephalosporin  use.     Family History  Problem Relation Age of Onset  . Stroke Maternal Aunt     Social History:  reports that she has quit smoking. She has quit using smokeless tobacco. She reports that she does not drink alcohol or use drugs.  ROS: A complete review of systems was performed.  All systems are negative except for pertinent findings as noted.  Physical Exam:  Vital signs in last 24 hours: Temp:  [99.6 F (37.6 C)-103.1 F (39.5 C)] 99.6 F (37.6 C) (10/30 0653) Pulse Rate:  [71-94] 81 (10/30 0604) Resp:  [16-18] 18 (10/30 0604) BP: (130-145)/(72-74) 130/72 (10/30 0604) SpO2:  [90 %-93 %] 92 % (10/30 1133) Constitutional:  Sleepy and difficulty to arouse; No acute distress Cardiovascular: Regular rate and rhythm Respiratory: Normal respiratory effort GI: Abdomen is soft, nontender, nondistended, no abdominal masses GU: foley in place with clear/yellow urine in bag Lymphatic: No lymphadenopathy Neurologic: Grossly intact, no focal deficits Psychiatric: cannot assess due to pts sleepiness  Laboratory Data:   Recent Labs  11/23/15 1100 11/24/15 0515 11/25/15 0459  WBC 7.3 10.0 12.3*  HGB 8.7* 8.9* 7.6*  HCT 27.5* 28.2* 23.6*  PLT 227 224 171     Recent Labs  11/23/15 1100 11/24/15 0515 11/25/15 0459  NA 142 141 140  K 4.2 4.5 4.9  CL 111 112* 114*  GLUCOSE 160* 87 147*  BUN 65* 69* 80*  CALCIUM 9.0 9.1 8.4*  CREATININE 3.09* 3.76* 4.61*     Results for orders placed or performed during the hospital encounter of 11/23/15 (from the past 24 hour(s))  Glucose, capillary     Status: None   Collection Time: 11/24/15  5:06 PM  Result Value Ref Range   Glucose-Capillary 93 65 - 99 mg/dL  Glucose, capillary     Status: Abnormal   Collection Time: 11/24/15  8:54 PM  Result Value Ref Range   Glucose-Capillary 144 (H) 65 - 99 mg/dL  CBC     Status: Abnormal   Collection Time: 11/25/15  4:59 AM  Result Value Ref Range   WBC 12.3 (H) 4.0 - 10.5 K/uL   RBC  2.59 (L) 3.87 - 5.11 MIL/uL   Hemoglobin 7.6 (L) 12.0 - 15.0 g/dL   HCT 23.6 (L) 36.0 - 46.0 %   MCV 91.1 78.0 - 100.0 fL   MCH 29.3 26.0 - 34.0 pg   MCHC 32.2 30.0 - 36.0 g/dL   RDW 14.0 11.5 - 15.5 %   Platelets 171 150 - 400 K/uL  Basic metabolic panel     Status: Abnormal   Collection Time: 11/25/15  4:59 AM  Result Value Ref Range   Sodium 140 135 - 145 mmol/L   Potassium 4.9 3.5 - 5.1 mmol/L   Chloride 114 (H) 101 - 111 mmol/L  CO2 18 (L) 22 - 32 mmol/L   Glucose, Bld 147 (H) 65 - 99 mg/dL   BUN 80 (H) 6 - 20 mg/dL   Creatinine, Ser 4.61 (H) 0.44 - 1.00 mg/dL   Calcium 8.4 (L) 8.9 - 10.3 mg/dL   GFR calc non Af Amer 9 (L) >60 mL/min   GFR calc Af Amer 11 (L) >60 mL/min   Anion gap 8 5 - 15  Reticulocytes     Status: Abnormal   Collection Time: 11/25/15  4:59 AM  Result Value Ref Range   Retic Ct Pct 2.0 0.4 - 3.1 %   RBC. 2.62 (L) 3.87 - 5.11 MIL/uL   Retic Count, Manual 52.4 19.0 - 186.0 K/uL  Glucose, capillary     Status: Abnormal   Collection Time: 11/25/15  7:13 AM  Result Value Ref Range   Glucose-Capillary 133 (H) 65 - 99 mg/dL  Glucose, capillary     Status: None   Collection Time: 11/25/15 11:54 AM  Result Value Ref Range   Glucose-Capillary 99 65 - 99 mg/dL   Recent Results (from the past 240 hour(s))  Urine culture     Status: Abnormal   Collection Time: 11/23/15 12:31 PM  Result Value Ref Range Status   Specimen Description URINE, RANDOM  Final   Special Requests NONE  Final   Culture (A)  Final    <10,000 COLONIES/mL INSIGNIFICANT GROWTH Performed at [facility]    Report Status 11/25/2015 FINAL  Final    Renal Function:  Recent Labs  11/23/15 1100 11/24/15 0515 11/25/15 0459  CREATININE 3.09* 3.76* 4.61*   CrCl cannot be calculated (Unknown ideal weight.).  Radiologic Imaging: Ct Head Wo Contrast  Result Date: 11/23/2015 CLINICAL DATA:  Altered mental status. EXAM: CT HEAD WITHOUT CONTRAST TECHNIQUE: Contiguous axial images  were obtained from the base of the skull through the vertex without intravenous contrast. COMPARISON:  CT scan of September 26, 2015. 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 Mild chronic ischemic white matter disease is noted. No mass effect or midline shift is noted. Ventricular size is within normal limits. There is no evidence of mass lesion, hemorrhage or acute infarction. Vascular: Atherosclerosis of carotid siphons is noted. Skull: Bony calvarium appears intact. Sinuses/Orbits: Paranasal sinuses are unremarkable. Other: None. IMPRESSION: Mild chronic ischemic white matter disease. 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 No acute intracranial abnormality seen. Electronically Signed   By: Marijo Conception, M.D.   On: 11/23/2015 17:57   US Renal  Result Date: 11/25/2015 CLINICAL DATA:  Renal failure EXAM: RENAL / URINARY TRACT ULTRASOUND COMPLETE COMPARISON:  CT abdomen/pelvis dated 03/21/2015 FINDINGS: Right Kidney: Length: 9.2 cm.  9 mm upper pole calculus.  No hydronephrosis. Left Kidney: Length: 11.5 cm.  8 mm upper pole calculus.  No hydronephrosis. Bladder: Decompressed by indwelling Foley catheter. IMPRESSION: Bilateral renal calculi, measuring up to 9 mm.  No hydronephrosis. Bladder decompressed by indwelling Foley catheter. Electronically Signed   By: Julian Hy M.D.   On: 11/25/2015 09:17   Dg Chest Port 1 View  Result Date: 11/24/2015 CLINICAL DATA:  Acute onset of fever.  Initial encounter. EXAM: PORTABLE CHEST 1 VIEW COMPARISON:  Chest radiograph performed 07/04/2015 FINDINGS: The lungs are mildly hypoexpanded. Vascular congestion is noted. Mild bibasilar opacities may reflect atelectasis or pneumonia, depending on the patient's symptoms. There is no evidence of pleural effusion or pneumothorax. The cardiomediastinal silhouette is borderline enlarged. No acute osseous abnormalities are seen. Chronic degenerative change is noted  at the left glenohumeral joint, with superior  subluxation of the left humeral head. IMPRESSION: Lungs mildly hypoexpanded. Vascular congestion and borderline cardiomegaly. Mild bibasilar opacities may reflect atelectasis or possibly pneumonia, depending on the patient's symptoms. Electronically Signed   By: Garald Balding M.D.   On: 11/24/2015 23:03    Impression/Recommendation:  Urinary retention--this is likely chronic considering the volume of urine the pt was retaining in her bladder with no pain or urge to void.  She probably has neurogenic bladder due to poorly controlled DM.  Leave the foley for bladder rest and adequate drainage.  Consider adding flomax. She will need a void trial and possibly urodynamics as an outpt.    UTI--culture shows insignificant growth.  Complete 5-7 day course of Cipro and recheck UA after completion.  Ensure pt has a good bowel regimen as constipation can contribute to retention and, therefore, recurrent UTIs.   Bilateral renal stones--non obstructing but are large.  Dr. Jeffie Pollock will discuss possible treatment options after the pt has recovered from current medical issues.   Acute on chronic renal insufficiency--Cr continues to rise despite relief of obstruction with foley.  She was previously taking Lasix and this is being held.  Continue IVF for dehydration.  Per IM.   Bruno, [place] 11/25/2015, 2:27 PM   I have reviewed the records and examined the patient.   She is too sleepy to interview.     Her retention is probably secondary to autonomic neuropathy related to her diabetes and with a residual of 2.7 liters, her bladder may remain chronically dysfunctional with such a significant stretch injury and if it recovers to the point where she can void spontaneously it may be several weeks.    I would not give her a voiding trial for at least a month and be ready to replace a foley because a voiding trial then is likely to be unsuccessful.   Flomax is unlikely to help in this scenario.   The renal stones are not  obstructing and don't need treatment at this time.

## 2015-11-25 NOTE — Progress Notes (Addendum)
Pharmacy Antibiotic Note  Michelle Holmes is a 65 y.o. female admitted on 11/23/2015 with UTI.  She is confused at baseline. She also has CKD with estimated GFR~10. Pharmacy has been consulted for Cipro renal dosing. Discussed patient with Dr Carolin Sicks.  Of note, she has been on Cipro since admission & is clinically improving.  Concerns for using Cipro include baseline confusion and CKD with estimated GFR~10. Given her hx of enterococcal UTI (sens LVQ; Cipro has variable coverage against enterococcus but likely adequate coverage in urine), noted PCN allergy (hives), and clinical improvement on 48h of Cipro, it is likely best option to continue for her pending cx data (currently negative).    Plan: Continue Cipro 500mg  po q24h Monitor renal function & cx data   Weight:  (No Weight scale in the bed and patient unable to stand.)  Temp (24hrs), Avg:100.3 F (37.9 C), Min:98.1 F (36.7 C), Max:103.1 F (39.5 C)   Recent Labs Lab 11/23/15 1100 11/24/15 0515 11/25/15 0459  WBC 7.3 10.0 12.3*  CREATININE 3.09* 3.76* 4.61*    CrCl cannot be calculated (Unknown ideal weight.).    Allergies  Allergen Reactions  . Penicillins Hives and Swelling    Has patient had a PCN reaction causing immediate rash, facial/tongue/throat swelling, SOB or lightheadedness with hypotension: yes Has patient had a PCN reaction causing severe rash involving mucus membranes or skin necrosis: no Has patient had a PCN reaction that required hospitalization no Has patient had a PCN reaction occurring within the last 10 years: no If all of the above answers are "NO", then may proceed with Cephalosporin use.     Antimicrobials this admission: Cipro 10/28 >>   Dose adjustments this admission: 10/30: Cipro decreased to q24h  Microbiology results: 10/30 BCx: ordered 10/28 UCx: sent  Thank you for allowing pharmacy to be a part of this patient's care.  Netta Cedars, PharmD, BCPS Pager:  [phone] 11/25/2015 9:26 AM

## 2015-11-25 NOTE — Progress Notes (Signed)
Pt removed CPAP mask stating she did not want to wear it. Advised of importance of wearing it. Given prn for sleep and reapply CPAP. Temp has reduced to 100.0. Pt breathing unlabored, lung expansion symmetrical, lungs clear, diminished. Will continue to monitor.

## 2015-11-25 NOTE — Progress Notes (Signed)
Patient and family declined SNF placement at this time.  Daughter plans to continue with HomeHealth. Please re-consult CSW if needed.

## 2015-11-25 NOTE — Progress Notes (Signed)
Pt removed CPAP mask again and refused to allow nurse to reapply. States she is not wearing it. RN explained need for mask and pt refuses and swats at nurse when attempting to apply.

## 2015-11-25 NOTE — Progress Notes (Signed)
Patient refuses nocturnal CPAP/BiPAP tonight. Equipment remains at the bedside. RT will continue to follow.

## 2015-11-26 LAB — GLUCOSE, CAPILLARY
Glucose-Capillary: 88 mg/dL (ref 65–99)
Glucose-Capillary: 155 mg/dL — ABNORMAL HIGH (ref 65–99)
Glucose-Capillary: 130 mg/dL — ABNORMAL HIGH (ref 65–99)
Glucose-Capillary: 116 mg/dL — ABNORMAL HIGH (ref 65–99)

## 2015-11-26 LAB — CBC
HCT: 22.3 % — ABNORMAL LOW (ref 36.0–46.0)
Hemoglobin: 7.2 g/dL — ABNORMAL LOW (ref 12.0–15.0)
MCH: 29 pg (ref 26.0–34.0)
MCHC: 32.3 g/dL (ref 30.0–36.0)
MCV: 89.9 fL (ref 78.0–100.0)
Platelets: 175 10*3/uL (ref 150–400)
RBC: 2.48 MIL/uL — ABNORMAL LOW (ref 3.87–5.11)
RDW: 14.1 % (ref 11.5–15.5)
WBC: 7.5 10*3/uL (ref 4.0–10.5)

## 2015-11-26 LAB — BASIC METABOLIC PANEL
Anion gap: 7 (ref 5–15)
BUN: 72 mg/dL — ABNORMAL HIGH (ref 6–20)
CO2: 18 mmol/L — ABNORMAL LOW (ref 22–32)
Calcium: 8.8 mg/dL — ABNORMAL LOW (ref 8.9–10.3)
Chloride: 117 mmol/L — ABNORMAL HIGH (ref 101–111)
Creatinine, Ser: 3.83 mg/dL — ABNORMAL HIGH (ref 0.44–1.00)
GFR calc Af Amer: 13 mL/min — ABNORMAL LOW (ref >60)
GFR calc non Af Amer: 11 mL/min — ABNORMAL LOW (ref >60)
Glucose, Bld: 87 mg/dL (ref 65–99)
Potassium: 4.8 mmol/L (ref 3.5–5.1)
Sodium: 142 mmol/L (ref 135–145)

## 2015-11-26 MED ORDER — SODIUM CHLORIDE 0.45 % IV SOLN
INTRAVENOUS | Status: DC
  Administered 2015-11-26 – 2015-11-27 (×2): via INTRAVENOUS

## 2015-11-26 MED ORDER — HYDRALAZINE HCL 20 MG/ML IJ SOLN
10.0000 mg | Freq: Three times a day (TID) | INTRAMUSCULAR | Status: DC | PRN
  Administered 2015-11-28: 10 mg via INTRAVENOUS
  Filled 2015-11-26: qty 1

## 2015-11-26 MED ORDER — AMLODIPINE BESYLATE 5 MG PO TABS
5.0000 mg | ORAL_TABLET | Freq: Every day | ORAL | Status: DC
  Administered 2015-11-26 – 2015-11-29 (×4): 5 mg via ORAL
  Filled 2015-11-26 (×4): qty 1

## 2015-11-26 NOTE — Progress Notes (Signed)
Patient agreeable to nocturnal CPAP when asked. However, once it is being positioned over her face, she refuses and begins to swat and pull away saying "no not that". When asked again if she is going to try to wear it, she says "no". RT will continue to follow.

## 2015-11-26 NOTE — Progress Notes (Signed)
PROGRESS NOTE    Michelle Holmes  TDD:[id number] DOB: July 05, 1950 DOA: 11/23/2015 PCP: Ricke Hey, MD   Brief Narrative: 65 y.o. female with HTN, HLD, DM type II insulin dependent, who presented via EMS with confusion. Found to have UTI and acute on chronic kidney disease.  Assessment & Plan:   # Acute encephalopathy likely in the setting of UTI and renal failure: Patient has no focal neurological deficit. CT scan of head showed no acute finding. TSH level acceptable. Patient's mental status seems improving. Continue to monitor. -PT, OT evaluation. PT recommended SNF and 24-hour supervision. Patient and family declining SNF. She will be discharged home with home care. Discussed with the care manager and social worker today.  #Acute cystitis without hematuria: Continue with ciprofloxacin. Urine culture has no significant growth. Prior urine culture grew enterococcus.   #Acute on chronic kidney disease stage III  in the setting of acute urinary retention and UTI: Holding Lasix.  -After initial Foley catheter insertion there was about 2.7 L of urine output. Ultrasound of kidneys consistent with nephrolithiasis without hydronephrosis. Changed IV fluid to half NS because of hyperchloremia. Serum creatinine level trending down today. She has about 2100 mL of urine output in 24 hours. She will need Foley catheter on discharge with outpatient follow-up with urology. Urology consult appreciated. -Repeat lab in the morning.  # Anemia: Multifactorial etiology including iron deficiency anemia and in the setting of kidney failure. Patient has no sign of bleeding. Reviewed iron panel lab results, patient has low iron storage. She is currently on oral iron supplement. Not planning to give IV iron because of ongoing infection. I will follow-up stool occult blood test result. Drop in hemoglobin to 7.2 today. She is clinically asymptomatic therefore I decided not to transfuse today. If further decline in  hemoglobin less than 7 or become symptomatic, consider transfusion.  #  Pressure injury of skin, on the back: Supportive care  #Essential hypertension: Elevated blood pressure today. The antihypertensive medication were held because of hypotensive episode yesterday evening. I resumed Norvasc today. Continue metoprolol at home dose. Added hydralazine as needed for uncontrolled hypertension. Discussed with the patient's nurse.  #Type 2 diabetes on chronic insulin: Monitor blood sugar level. Change to carb modified diet. Continue current insulin regimen.  Continue current medical and supportive care.  DVT prophylaxis: Discontinue heparin because of drop in hemoglobin and anemia. Continue SCD. She is already on aspirin and Plavix. Code Status: Full code Family Communication: No family present at bedside Disposition Plan: Likely discharged home with home care in 1-2 days.    Consultants:   None  Procedures: None Antimicrobials: Ciprofloxacin started since October 29  Subjective: Patient was seen and examined at bedside. Patient was more alert today. She was talking and oriented to hospital, and name. She was not oriented to date or time. Patient denied headache, dizziness, chest pain, shortness of breath, nausea or vomiting.  Objective: Vitals:   11/26/15 0935 11/26/15 0957 11/26/15 1257 11/26/15 1302  BP: (!) 158/79 (!) 158/79 (!) 179/91 (!) 184/97  Pulse:  75 71   Resp:   20   Temp:   99.2 F (37.3 C)   TempSrc:   Oral   SpO2:   97%     Intake/Output Summary (Last 24 hours) at 11/26/15 1404 Last data filed at 11/26/15 1050  Gross per 24 hour  Intake                0 ml  Output  2700 ml  Net            -2700 ml   Filed Weights    Examination:  General exam: Pleasant female lying comfortably. Not in distress Respiratory system: Clear to auscultation bilateral, no wheeze or crackle. Cardiovascular system: Regular rate rhythm, S1 and S2 normal. No  murmur. Gastrointestinal system: Abdomen soft, nontender, nondistended. Bowel sound positive Central nervous system: Alert, awake, following commands. No focal neurological deficit. Extremities: Symmetric 5 x 5 power. Skin: No rashes, lesions or ulcers Psychiatry: Judgement and insight appear normal. Mood & affect appropriate.  Gu: Foley catheter and bag with clear urine    Data Reviewed: I have personally reviewed following labs and imaging studies  CBC:  Recent Labs Lab 11/23/15 1100 11/24/15 0515 11/25/15 0459 11/26/15 0508  WBC 7.3 10.0 12.3* 7.5  NEUTROABS 4.8  --   --   --   HGB 8.7* 8.9* 7.6* 7.2*  HCT 27.5* 28.2* 23.6* 22.3*  MCV 91.1 89.5 91.1 89.9  PLT 227 224 171 098   Basic Metabolic Panel:  Recent Labs Lab 11/23/15 1100 11/24/15 0515 11/25/15 0459 11/26/15 0508  NA 142 141 140 142  K 4.2 4.5 4.9 4.8  CL 111 112* 114* 117*  CO2 22 19* 18* 18*  GLUCOSE 160* 87 147* 87  BUN 65* 69* 80* 72*  CREATININE 3.09* 3.76* 4.61* 3.83*  CALCIUM 9.0 9.1 8.4* 8.8*   GFR: CrCl cannot be calculated (Unknown ideal weight.). Liver Function Tests: No results for input(s): AST, ALT, ALKPHOS, BILITOT, PROT, ALBUMIN in the last 168 hours. No results for input(s): LIPASE, AMYLASE in the last 168 hours. No results for input(s): AMMONIA in the last 168 hours. Coagulation Profile: No results for input(s): INR, PROTIME in the last 168 hours. Cardiac Enzymes: No results for input(s): CKTOTAL, CKMB, CKMBINDEX, TROPONINI in the last 168 hours. BNP (last 3 results) No results for input(s): PROBNP in the last 8760 hours. HbA1C:  Recent Labs  11/24/15 0515  HGBA1C 6.6*   CBG:  Recent Labs Lab 11/25/15 1154 11/25/15 1716 11/25/15 2042 11/26/15 0747 11/26/15 1140  GLUCAP 99 97 127* 88 155*   Lipid Profile: No results for input(s): CHOL, HDL, LDLCALC, TRIG, CHOLHDL, LDLDIRECT in the last 72 hours. Thyroid Function Tests:  Recent Labs  11/24/15 0515  TSH 2.203    Anemia Panel:  Recent Labs  11/25/15 0459 11/25/15 1259  FERRITIN  --  218  TIBC  --  157*  IRON  --  10*  RETICCTPCT 2.0  --    Sepsis Labs: No results for input(s): PROCALCITON, LATICACIDVEN in the last 168 hours.  Recent Results (from the past 240 hour(s))  Urine culture     Status: Abnormal   Collection Time: 11/23/15 12:31 PM  Result Value Ref Range Status   Specimen Description URINE, RANDOM  Final   Special Requests NONE  Final   Culture (A)  Final    <10,000 COLONIES/mL INSIGNIFICANT GROWTH Performed at [facility]    Report Status 11/25/2015 FINAL  Final  Culture, blood (routine x 2)     Status: None (Preliminary result)   Collection Time: 11/25/15  4:59 AM  Result Value Ref Range Status   Specimen Description BLOOD LEFT ANTECUBITAL  Final   Special Requests IN PEDIATRIC BOTTLE 2ML  Final   Culture   Final    NO GROWTH 1 DAY Performed at [facility]    Report Status PENDING  Incomplete  Culture, blood (routine  x 2)     Status: None (Preliminary result)   Collection Time: 11/25/15  4:59 AM  Result Value Ref Range Status   Specimen Description BLOOD RIGHT HAND  Final   Special Requests IN PEDIATRIC BOTTLE 2ML  Final   Culture   Final    NO GROWTH 1 DAY Performed at [facility]    Report Status PENDING  Incomplete         Radiology Studies: US Renal  Result Date: 11/25/2015 CLINICAL DATA:  Renal failure EXAM: RENAL / URINARY TRACT ULTRASOUND COMPLETE COMPARISON:  CT abdomen/pelvis dated 03/21/2015 FINDINGS: Right Kidney: Length: 9.2 cm.  9 mm upper pole calculus.  No hydronephrosis. Left Kidney: Length: 11.5 cm.  8 mm upper pole calculus.  No hydronephrosis. Bladder: Decompressed by indwelling Foley catheter. IMPRESSION: Bilateral renal calculi, measuring up to 9 mm.  No hydronephrosis. Bladder decompressed by indwelling Foley catheter. Electronically Signed   By: Julian Hy M.D.   On: 11/25/2015 09:17   Dg Chest Port 1  View  Result Date: 11/24/2015 CLINICAL DATA:  Acute onset of fever.  Initial encounter. EXAM: PORTABLE CHEST 1 VIEW COMPARISON:  Chest radiograph performed 07/04/2015 FINDINGS: The lungs are mildly hypoexpanded. Vascular congestion is noted. Mild bibasilar opacities may reflect atelectasis or pneumonia, depending on the patient's symptoms. There is no evidence of pleural effusion or pneumothorax. The cardiomediastinal silhouette is borderline enlarged. No acute osseous abnormalities are seen. Chronic degenerative change is noted at the left glenohumeral joint, with superior subluxation of the left humeral head. IMPRESSION: Lungs mildly hypoexpanded. Vascular congestion and borderline cardiomegaly. Mild bibasilar opacities may reflect atelectasis or possibly pneumonia, depending on the patient's symptoms. Electronically Signed   By: Garald Balding M.D.   On: 11/24/2015 23:03        Scheduled Meds: . amLODipine  5 mg Oral Daily  . aspirin EC  81 mg Oral Daily  . ciprofloxacin  500 mg Oral Q breakfast  . clopidogrel  75 mg Oral Daily  . ferrous sulfate  325 mg Oral BID WC  . heparin  5,000 Units Subcutaneous Q8H  . insulin aspart  0-9 Units Subcutaneous TID WC  . insulin glargine  10 Units Subcutaneous QHS  . metoprolol succinate  50 mg Oral Daily  . mometasone-formoterol  2 puff Inhalation BID  . pantoprazole  40 mg Oral Daily  . pregabalin  50 mg Oral Daily  . sodium chloride flush  3 mL Intravenous Q12H   Continuous Infusions: . sodium chloride 100 mL/hr at 11/26/15 0824     LOS: 3 days    Time spent:27 minutes    Dron Tanna Furry, MD [facility] Pager [phone]  If 7PM-7AM, please contact night-coverage [email or web address] Password [email or web address] 11/26/2015, 2:04 PM

## 2015-11-26 NOTE — Progress Notes (Signed)
Physical Therapy Treatment Patient Details Name: Michelle Holmes MRN: [id number] DOB: [birth year] Today's Date: 11/26/2015    History of Present Illness 64 yo female admitted with acute renal failure, confusion. 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  Pt stated she sleeps in a lift chair and has to have someone "pick me up" to transfer to [facility] or WC.  + 2 total assist to transfer pt from supine to EOB with 0% pt assist.  Poor sitting tolerance EOB with severe R lean and posterior drift with no self correction.  Pt started to c/o back pain with activity.  "I got to lay down".  Assisted back to supine and rolling R and L to adjust pad and scoot pt to [facility].  All at [facility].   Per chart review, pt and family decline SNF. Pt will need PTAR transport.    Follow Up Recommendations  SNF     Equipment Recommendations       Recommendations for Other Services       Precautions / Restrictions Precautions Precautions: Fall Precaution Comments: R hemiparesis S/P CVA Restrictions Weight Bearing Restrictions: No    Mobility  Bed Mobility Overal bed mobility: Needs Assistance Bed Mobility: Rolling;Sidelying to Sit;Supine to Sit;Sit to Supine;Sit to Sidelying Rolling: Total assist;+2 for physical assistance Sidelying to sit: Total assist;+2 for physical assistance Supine to sit: Total assist;+2 for physical assistance Sit to supine: Total assist;+2 for physical assistance Sit to sidelying: Total assist;+2 for physical assistance General bed mobility comments: [facility] + 2 pt 0%     very limited sitting tolerance EOB with severe R/posterior lean and inability to self correct desoite verbal and tactile cueing.  Poor sitting balance.  Transfers                 General transfer comment: NT  Ambulation/Gait                 Stairs            Wheelchair  Mobility    Modified Rankin (Stroke Patients Only)       Balance                                    Cognition Arousal/Alertness: Awake/alert (slow/groggy) Behavior During Therapy: WFL for tasks assessed/performed Overall Cognitive Status: No family/caregiver present to determine baseline cognitive functioning                      Exercises      General Comments        Pertinent Vitals/Pain Pain Location: back pain with sitting EOB Pain Descriptors / Indicators: Grimacing Pain Intervention(s): Monitored during session;Repositioned    Home Living                      Prior Function            PT Goals (current goals can now be found in the care plan section) Progress towards PT goals: Progressing toward goals    Frequency    Min 3X/week      PT Plan      Co-evaluation             End of Session  Activity Tolerance: Other (comment) (self limiting) Patient left: in bed;with call bell/phone within reach;with bed alarm set     Time: 1411-1434 PT Time Calculation (min) (ACUTE ONLY): 23 min  Charges:  $Therapeutic Activity: 23-37 mins                    G Codes:      Rica Koyanagi  PTA [facility]  Acute  Rehab Pager      [phone]

## 2015-11-27 DIAGNOSIS — R339 Retention of urine, unspecified: Secondary | ICD-10-CM

## 2015-11-27 DIAGNOSIS — G9341 Metabolic encephalopathy: Secondary | ICD-10-CM

## 2015-11-27 DIAGNOSIS — N189 Chronic kidney disease, unspecified: Secondary | ICD-10-CM

## 2015-11-27 DIAGNOSIS — R338 Other retention of urine: Secondary | ICD-10-CM

## 2015-11-27 DIAGNOSIS — N179 Acute kidney failure, unspecified: Principal | ICD-10-CM

## 2015-11-27 DIAGNOSIS — G8191 Hemiplegia, unspecified affecting right dominant side: Secondary | ICD-10-CM

## 2015-11-27 LAB — BASIC METABOLIC PANEL
Anion gap: 9 (ref 5–15)
BUN: 55 mg/dL — ABNORMAL HIGH (ref 6–20)
CO2: 16 mmol/L — ABNORMAL LOW (ref 22–32)
Calcium: 8.8 mg/dL — ABNORMAL LOW (ref 8.9–10.3)
Chloride: 114 mmol/L — ABNORMAL HIGH (ref 101–111)
Creatinine, Ser: 3.11 mg/dL — ABNORMAL HIGH (ref 0.44–1.00)
GFR calc Af Amer: 17 mL/min — ABNORMAL LOW (ref >60)
GFR calc non Af Amer: 15 mL/min — ABNORMAL LOW (ref >60)
Glucose, Bld: 105 mg/dL — ABNORMAL HIGH (ref 65–99)
Potassium: 4.5 mmol/L (ref 3.5–5.1)
Sodium: 139 mmol/L (ref 135–145)

## 2015-11-27 LAB — CBC
HCT: 25 % — ABNORMAL LOW (ref 36.0–46.0)
Hemoglobin: 8.3 g/dL — ABNORMAL LOW (ref 12.0–15.0)
MCH: 29.4 pg (ref 26.0–34.0)
MCHC: 33.2 g/dL (ref 30.0–36.0)
MCV: 88.7 fL (ref 78.0–100.0)
Platelets: 197 10*3/uL (ref 150–400)
RBC: 2.82 MIL/uL — ABNORMAL LOW (ref 3.87–5.11)
RDW: 13.8 % (ref 11.5–15.5)
WBC: 7 10*3/uL (ref 4.0–10.5)

## 2015-11-27 LAB — GLUCOSE, CAPILLARY
Glucose-Capillary: 114 mg/dL — ABNORMAL HIGH (ref 65–99)
Glucose-Capillary: 114 mg/dL — ABNORMAL HIGH (ref 65–99)
Glucose-Capillary: 93 mg/dL (ref 65–99)
Glucose-Capillary: 89 mg/dL (ref 65–99)

## 2015-11-27 MED ORDER — SODIUM BICARBONATE 650 MG PO TABS
650.0000 mg | ORAL_TABLET | Freq: Two times a day (BID) | ORAL | Status: DC
  Administered 2015-11-27 – 2015-11-28 (×3): 650 mg via ORAL
  Filled 2015-11-27 (×4): qty 1

## 2015-11-27 MED ORDER — HYDRALAZINE HCL 25 MG PO TABS
25.0000 mg | ORAL_TABLET | Freq: Three times a day (TID) | ORAL | Status: DC
  Administered 2015-11-27 – 2015-11-29 (×3): 25 mg via ORAL
  Filled 2015-11-27 (×4): qty 1

## 2015-11-27 MED ORDER — SENNOSIDES-DOCUSATE SODIUM 8.6-50 MG PO TABS
1.0000 | ORAL_TABLET | Freq: Two times a day (BID) | ORAL | Status: DC
  Administered 2015-11-27 – 2015-11-28 (×3): 1 via ORAL
  Filled 2015-11-27 (×4): qty 1

## 2015-11-27 MED ORDER — POLYETHYLENE GLYCOL 3350 17 G PO PACK
17.0000 g | PACK | Freq: Every day | ORAL | Status: DC
  Administered 2015-11-27: 17 g via ORAL
  Filled 2015-11-27 (×3): qty 1

## 2015-11-27 NOTE — Final Consult Note (Signed)
Consultant Final Sign-Off Note    Assessment/Final recommendations  Michelle Holmes is a 65 y.o. female followed by me for urinary retention with 2.7 liter residual.   Renal function improving with catheter drainage.   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  CIC would be preferable but her body habitus and immobility would make that difficult.   Discharge with foley and f/u with [place] in 3-4 weeks.  F/U information  in chart.    Wound care (if applicable):    Diet at discharge: per primary team   Activity at discharge: per primary team   Follow-up appointment:  3-4 weeks with me.  Please call.  See appt request.   Pending results:  Unresulted Labs    Start     Ordered   11/25/15 1159  Occult blood card to lab, stool  Once,   R     11/25/15 1159       Medication recommendations:   Other recommendations:    Thank you for allowing [place] to participate in the care of your patient!  Please consult [place] again if you have further needs for your patient.  Michelle Holmes 11/27/2015 7:56 AM    Subjective     Objective  Vital signs in last 24 hours: Temp:  [98.2 F (36.8 C)-99.8 F (37.7 C)] 98.2 F (36.8 C) (11/01 0536) Pulse Rate:  [71-76] 71 (11/01 0536) Resp:  [16-20] 16 (11/01 0536) BP: (153-184)/(77-97) 153/77 (11/01 0536) SpO2:  [94 %-100 %] 100 % (11/01 0536)  General: WD, WN and more awake today in NAD. GU: Catheter is draining clear urine.    Pertinent labs and Studies:  Recent Labs  11/25/15 0459 11/26/15 0508 11/27/15 0518  WBC 12.3* 7.5 7.0  HGB 7.6* 7.2* 8.3*  HCT 23.6* 22.3* 25.0*   BMET  Recent Labs  11/26/15 0508 11/27/15 0518  NA 142 139  K 4.8 4.5  CL 117* 114*  CO2 18* 16*  GLUCOSE 87 105*  BUN 72* 55*  CREATININE 3.83* 3.11*  CALCIUM 8.8* 8.8*   No results for input(s): LABURIN in the last 72  hours. Results for orders placed or performed during the hospital encounter of 11/23/15  Urine culture     Status: Abnormal   Collection Time: 11/23/15 12:31 PM  Result Value Ref Range Status   Specimen Description URINE, RANDOM  Final   Special Requests NONE  Final   Culture (A)  Final    <10,000 COLONIES/mL INSIGNIFICANT GROWTH Performed at [facility]    Report Status 11/25/2015 FINAL  Final  Culture, blood (routine x 2)     Status: None (Preliminary result)   Collection Time: 11/25/15  4:59 AM  Result Value Ref Range Status   Specimen Description BLOOD LEFT ANTECUBITAL  Final   Special Requests IN PEDIATRIC BOTTLE 2ML  Final   Culture   Final    NO GROWTH 1 DAY Performed at [facility]    Report Status PENDING  Incomplete  Culture, blood (routine x 2)     Status: None (Preliminary result)   Collection Time: 11/25/15  4:59 AM  Result Value Ref Range Status   Specimen Description BLOOD RIGHT HAND  Final   Special Requests IN PEDIATRIC BOTTLE 2ML  Final   Culture   Final    NO GROWTH 1 DAY Performed at [facility]  Report Status PENDING  Incomplete    Imaging: No results found.

## 2015-11-27 NOTE — Care Management Important Message (Signed)
Important Message  Patient Details  Name: Michelle Holmes MRN: [id number] Date of Birth: 1950-04-15   Medicare Important Message Given:  Yes    Camillo Flaming 11/27/2015, 10:07 AMImportant Message  Patient Details  Name: Michelle Holmes MRN: [id number] Date of Birth: 1950-10-17   Medicare Important Message Given:  Yes    Camillo Flaming 11/27/2015, 10:07 AM

## 2015-11-27 NOTE — Progress Notes (Addendum)
Tried to call [name] at number listed, no answer and VM was full. Called other daughter, [name], she states she is very involved in patients care. She wants to take the patient home. She says they have CNA's that help 7d/wk 9-12 & 5-7 and a friend who comes in to fill the gaps and stays overnight. Patient is able to stand to pivot for her, she says she is not able to ambulate. She has no DME needs. She is interested in [facility] for PT and RN. She has used [facility] in the past and would like to use them if possible. Contacted [facility] for the referral. They are unable to take. Contacted [facility] for referral. Awaiting final orders.

## 2015-11-27 NOTE — Progress Notes (Signed)
PROGRESS NOTE    Michelle Holmes  PNT:[id number] DOB: Mar 30, 1950 DOA: 11/23/2015 PCP: Ricke Hey, MD   Brief Narrative: 65 y.o. female with HTN, HLD, DM type II insulin dependent, who presented via EMS with confusion. Found to have UTI and acute on chronic kidney disease.  Assessment & Plan:   # Acute encephalopathy likely in the setting of UTI and renal failure:  Patient has no focal neurological deficit. CT scan of head showed no acute finding. TSH level acceptable.  Still very drowsy -PT, OT evaluation. PT recommended SNF and 24-hour supervision. Patient and family declining SNF. She will be discharged home with home care. Discussed with the care manager and social worker today.  #Acute cystitis without hematuria: Continue with ciprofloxacin. Urine culture has no significant growth. Prior urine culture grew enterococcus.   #Acute on chronic kidney disease stage III  in the setting of acute urinary retention and UTI: Holding Lasix.  -After initial Foley catheter insertion there was about 2.7 L of urine output. - Ultrasound of kidneys consistent with nephrolithiasis without hydronephrosis.  -She received IVF initially. Serum creatinine level trending down, good urine output - She will need Foley catheter on discharge and outpatientollow-up with urology. Urology consult appreciated. -d/c ivf, start bicarb supplement, continue hold lasix, trend cr  # Anemia:  Multifactorial etiology including iron deficiency anemia and in the setting of kidney failure. Patient has no sign of bleeding. She is currently on oral iron supplement. Not planning to give IV iron because of ongoing infection. stool occult blood test pending collection Transfuse to keep hgb>7, no indication for transfusion so far.  #  Pressure injury of skin, on the back: Supportive care  #Essential hypertension:   The antihypertensive medication were held briefly because of hypotensive episode initially on presentation.   Norvasc and  metoprolol resume,  bp remain elevated, start scheduled hydralazine, may need change to bidil if bp remain elevated.   #Type 2 diabetes on chronic insulin: carb modified diet. Continue current insulin regimen. a1c 6.6.    DVT prophylaxis: Discontinue heparin because of drop in hemoglobin and anemia. Continue SCD. She is already on aspirin and Plavix. Code Status: Full code Family Communication: No family present at bedside Disposition Plan: Likely discharged home with home care when more awake and cr improving.    Consultants:   None  Procedures: None Antimicrobials: Ciprofloxacin started since October 29  Subjective:  Patient was seen and examined at bedside.  Drowsy , falling back to sleep during conversation, not oriented to time, know she is in the hospital, but not able to state the name   Patient denied headache, dizziness, chest pain, shortness of breath, nausea or vomiting.  2.8liter urine output , foley in place  Objective: Vitals:   11/26/15 2100 11/26/15 2153 11/27/15 0536 11/27/15 0937  BP: (!) 158/80  (!) 153/77   Pulse: 76  71   Resp: 17  16   Temp: 99.8 F (37.7 C)  98.2 F (36.8 C)   TempSrc: Oral  Oral   SpO2: 96% 94% 100% 96%    Intake/Output Summary (Last 24 hours) at 11/27/15 1151 Last data filed at 11/27/15 1146  Gross per 24 hour  Intake              960 ml  Output             3100 ml  Net            -2140 ml   [employer]  Examination:  General exam: drowsy Respiratory system: Clear to auscultation bilateral, no wheeze or crackle. Cardiovascular system: Regular rate rhythm, S1 and S2 normal. No murmur. Gastrointestinal system: Abdomen soft, nontender, nondistended. Bowel sound positive Central nervous system: Alert, awake, following commands. No focal neurological deficit. Extremities: Symmetric 5 x 5 power. Pitting edema bilateral lower extremity Skin: No rashes, lesions or ulcers Psychiatry: Judgement and insight  appear normal. Mood & affect appropriate.  Gu: Foley catheter and bag with clear urine    Data Reviewed: I have personally reviewed following labs and imaging studies  CBC:  Recent Labs Lab 11/23/15 1100 11/24/15 0515 11/25/15 0459 11/26/15 0508 11/27/15 0518  WBC 7.3 10.0 12.3* 7.5 7.0  NEUTROABS 4.8  --   --   --   --   HGB 8.7* 8.9* 7.6* 7.2* 8.3*  HCT 27.5* 28.2* 23.6* 22.3* 25.0*  MCV 91.1 89.5 91.1 89.9 88.7  PLT 227 224 171 175 209   Basic Metabolic Panel:  Recent Labs Lab 11/23/15 1100 11/24/15 0515 11/25/15 0459 11/26/15 0508 11/27/15 0518  NA 142 141 140 142 139  K 4.2 4.5 4.9 4.8 4.5  CL 111 112* 114* 117* 114*  CO2 22 19* 18* 18* 16*  GLUCOSE 160* 87 147* 87 105*  BUN 65* 69* 80* 72* 55*  CREATININE 3.09* 3.76* 4.61* 3.83* 3.11*  CALCIUM 9.0 9.1 8.4* 8.8* 8.8*   GFR: CrCl cannot be calculated (Unknown ideal weight.). Liver Function Tests: No results for input(s): AST, ALT, ALKPHOS, BILITOT, PROT, ALBUMIN in the last 168 hours. No results for input(s): LIPASE, AMYLASE in the last 168 hours. No results for input(s): AMMONIA in the last 168 hours. Coagulation Profile: No results for input(s): INR, PROTIME in the last 168 hours. Cardiac Enzymes: No results for input(s): CKTOTAL, CKMB, CKMBINDEX, TROPONINI in the last 168 hours. BNP (last 3 results) No results for input(s): PROBNP in the last 8760 hours. HbA1C: No results for input(s): HGBA1C in the last 72 hours. CBG:  Recent Labs Lab 11/26/15 1140 11/26/15 1655 11/26/15 2143 11/27/15 0736 11/27/15 1119  GLUCAP 155* 130* 116* 114* 114*   Lipid Profile: No results for input(s): CHOL, HDL, LDLCALC, TRIG, CHOLHDL, LDLDIRECT in the last 72 hours. Thyroid Function Tests: No results for input(s): TSH, T4TOTAL, FREET4, T3FREE, THYROIDAB in the last 72 hours. Anemia Panel:  Recent Labs  11/25/15 0459 11/25/15 1259  FERRITIN  --  218  TIBC  --  157*  IRON  --  10*  RETICCTPCT 2.0  --     Sepsis Labs: No results for input(s): PROCALCITON, LATICACIDVEN in the last 168 hours.  Recent Results (from the past 240 hour(s))  Urine culture     Status: Abnormal   Collection Time: 11/23/15 12:31 PM  Result Value Ref Range Status   Specimen Description URINE, RANDOM  Final   Special Requests NONE  Final   Culture (A)  Final    <10,000 COLONIES/mL INSIGNIFICANT GROWTH Performed at [facility]    Report Status 11/25/2015 FINAL  Final  Culture, blood (routine x 2)     Status: None (Preliminary result)   Collection Time: 11/25/15  4:59 AM  Result Value Ref Range Status   Specimen Description BLOOD LEFT ANTECUBITAL  Final   Special Requests IN PEDIATRIC BOTTLE 2ML  Final   Culture   Final    NO GROWTH 1 DAY Performed at [facility]    Report Status PENDING  Incomplete  Culture, blood (routine x 2)  Status: None (Preliminary result)   Collection Time: 11/25/15  4:59 AM  Result Value Ref Range Status   Specimen Description BLOOD RIGHT HAND  Final   Special Requests IN PEDIATRIC BOTTLE 2ML  Final   Culture   Final    NO GROWTH 1 DAY Performed at [facility]    Report Status PENDING  Incomplete         Radiology Studies: No results found.      Scheduled Meds: . amLODipine  5 mg Oral Daily  . aspirin EC  81 mg Oral Daily  . ciprofloxacin  500 mg Oral Q breakfast  . clopidogrel  75 mg Oral Daily  . ferrous sulfate  325 mg Oral BID WC  . insulin aspart  0-9 Units Subcutaneous TID WC  . insulin glargine  10 Units Subcutaneous QHS  . metoprolol succinate  50 mg Oral Daily  . mometasone-formoterol  2 puff Inhalation BID  . pantoprazole  40 mg Oral Daily  . pregabalin  50 mg Oral Daily  . sodium chloride flush  3 mL Intravenous Q12H   Continuous Infusions: . sodium chloride 100 mL/hr at 11/27/15 1111     LOS: 4 days    Time spent:27 minutes    Amanii Snethen, MD PhD [facility] Pager [phone]  If 7PM-7AM, please  contact night-coverage [email or web address] Password [email or web address] 11/27/2015, 11:51 AM

## 2015-11-27 NOTE — Progress Notes (Signed)
Pt refused CPAP qhs.  RT encouraged Pt to call should she change her mind and wish to wear it tonight.  Education provided.  RT will continue to monitor as needed.

## 2015-11-28 ENCOUNTER — Encounter: Payer: Self-pay | Admitting: Physical Medicine & Rehabilitation

## 2015-11-28 ENCOUNTER — Telehealth: Payer: Self-pay | Admitting: Physical Medicine & Rehabilitation

## 2015-11-28 LAB — GLUCOSE, CAPILLARY
Glucose-Capillary: 96 mg/dL (ref 65–99)
Glucose-Capillary: 94 mg/dL (ref 65–99)
Glucose-Capillary: 111 mg/dL — ABNORMAL HIGH (ref 65–99)
Glucose-Capillary: 135 mg/dL — ABNORMAL HIGH (ref 65–99)

## 2015-11-28 LAB — BASIC METABOLIC PANEL
Anion gap: 9 (ref 5–15)
BUN: 45 mg/dL — ABNORMAL HIGH (ref 6–20)
CO2: 19 mmol/L — ABNORMAL LOW (ref 22–32)
Calcium: 9 mg/dL (ref 8.9–10.3)
Chloride: 111 mmol/L (ref 101–111)
Creatinine, Ser: 2.76 mg/dL — ABNORMAL HIGH (ref 0.44–1.00)
GFR calc Af Amer: 20 mL/min — ABNORMAL LOW (ref >60)
GFR calc non Af Amer: 17 mL/min — ABNORMAL LOW (ref >60)
Glucose, Bld: 92 mg/dL (ref 65–99)
Potassium: 4.1 mmol/L (ref 3.5–5.1)
Sodium: 139 mmol/L (ref 135–145)

## 2015-11-28 LAB — CBC
HCT: 25.7 % — ABNORMAL LOW (ref 36.0–46.0)
Hemoglobin: 8.6 g/dL — ABNORMAL LOW (ref 12.0–15.0)
MCH: 29 pg (ref 26.0–34.0)
MCHC: 33.5 g/dL (ref 30.0–36.0)
MCV: 86.5 fL (ref 78.0–100.0)
Platelets: 205 10*3/uL (ref 150–400)
RBC: 2.97 MIL/uL — ABNORMAL LOW (ref 3.87–5.11)
RDW: 13.5 % (ref 11.5–15.5)
WBC: 7.2 10*3/uL (ref 4.0–10.5)

## 2015-11-28 LAB — OCCULT BLOOD X 1 CARD TO LAB, STOOL: Fecal Occult Bld: NEGATIVE

## 2015-11-28 NOTE — Progress Notes (Signed)
Patient very tearful and sad. Patient told this RN that she was in pain but refuses to take IV or PO mediations. Patient also refuses to take other scheduled PO meds. Patient on phone with daughter and states "the nurse is giving me medicine and trying to kill me". This RN informed patient that the only medication she received tonight is her Lantus 10 units. Daughter is on her way to see patient. Will talk with daughter once she gets here.

## 2015-11-28 NOTE — Progress Notes (Signed)
Pt has refused CPAP for the night.  RT to monitor and assess as needed.  

## 2015-11-28 NOTE — Telephone Encounter (Signed)
patient is still waiting on a referral for therapy to be sent to [facility].  They have called and [facility] states they have not gotten any information or referral on the patient

## 2015-11-28 NOTE — Progress Notes (Signed)
PROGRESS NOTE    Michelle Holmes  YJE:[id number] DOB: Dec 05, 1950 DOA: 11/23/2015 PCP: Ricke Hey, MD   Brief Narrative: 65 y.o. female with HTN, HLD, DM type II insulin dependent, who presented via EMS with confusion. Found to have UTI and acute on chronic kidney disease.  Assessment & Plan:   # Acute encephalopathy likely in the setting of UTI and renal failure:  Patient has no focal neurological deficit. CT scan of head showed no acute finding. TSH level acceptable.  Still very drowsy -PT, OT evaluation. PT recommended SNF and 24-hour supervision. Patient and family declining SNF. She will be discharged home with home care once more awake  #Acute cystitis without hematuria: Continue with ciprofloxacin. Urine culture has no significant growth. Prior urine culture grew enterococcus.   #Acute on chronic kidney disease stage III  in the setting of acute urinary retention and UTI:  - Ultrasound of kidneys consistent with nephrolithiasis without hydronephrosis.  -She received IVF initially. Serum creatinine level trending down, good urine output - She will need Foley catheter on discharge and outpatientollow-up with urology. Urology consult appreciated. - Cr peaked at 4.6, now trending down, continue bicarb supplement, continue Holding Lasix. Off ivf - 2.7-2.8 L of urine output daily in the last two days  # Anemia:  Multifactorial etiology including iron deficiency anemia and in the setting of kidney failure. Patient has no sign of bleeding. She is currently on oral iron supplement. Not planning to give IV iron because of ongoing infection. stool occult blood test pending collection Transfuse to keep hgb>7, no indication for transfusion so far.  #  Pressure injury of skin, on the back: Supportive care  #Essential hypertension:   The antihypertensive medication were held briefly because of hypotensive episode initially on presentation.  Norvasc and  metoprolol resume,  bp remain  elevated, start scheduled hydralazine, may need change to bidil if bp remain elevated.   #Type 2 diabetes on chronic insulin: carb modified diet. Continue current insulin regimen. a1c 6.6.    DVT prophylaxis: Discontinue heparin because of drop in hemoglobin and anemia. Continue SCD. She is already on aspirin and Plavix. Code Status: Full code Family Communication: No family present at bedside Disposition Plan: Likely discharged home with home care when more awake and cr improving.    Consultants:   None  Procedures: None Antimicrobials: Ciprofloxacin started since October 29  Subjective:   Patient was seen and examined at bedside.  Seems more alert, but still falling back to sleep during conversation, not oriented to time, know she is in the hospital, but not able to state the name,   No appetite   Patient denied headache, dizziness, chest pain, shortness of breath, nausea or vomiting.  2.7liter urine output , foley in place  Objective: Vitals:   11/27/15 1410 11/27/15 2209 11/28/15 0521 11/28/15 0800  BP: (!) 172/82 (!) 175/87 (!) 167/88 (!) 155/88  Pulse: 67 74 69 71  Resp: 18 18 16    Temp: 98.8 F (37.1 C) 99.4 F (37.4 C) 98.9 F (37.2 C) 98.3 F (36.8 C)  TempSrc: Oral Oral Oral Oral  SpO2: 99% 100% 99% 99%    Intake/Output Summary (Last 24 hours) at 11/28/15 1144 Last data filed at 11/28/15 0845  Gross per 24 hour  Intake              240 ml  Output             2250 ml  Net            -  2010 ml   [employer]    Examination:  General exam: drowsy, not oriented Respiratory system: Clear to auscultation bilateral, no wheeze or crackle. Cardiovascular system: Regular rate rhythm, S1 and S2 normal. No murmur. Gastrointestinal system: Abdomen soft, nontender, nondistended. Bowel sound positive Central nervous system: Alert, awake, following commands. No focal neurological deficit. Extremities: Symmetric 5 x 5 power. Pitting edema bilateral lower  extremity subsided Skin: No rashes, lesions or ulcers Psychiatry: droswy.  Gu: Foley catheter and bag with clear urine    Data Reviewed: I have personally reviewed following labs and imaging studies  CBC:  Recent Labs Lab 11/23/15 1100 11/24/15 0515 11/25/15 0459 11/26/15 0508 11/27/15 0518 11/28/15 0522  WBC 7.3 10.0 12.3* 7.5 7.0 7.2  NEUTROABS 4.8  --   --   --   --   --   HGB 8.7* 8.9* 7.6* 7.2* 8.3* 8.6*  HCT 27.5* 28.2* 23.6* 22.3* 25.0* 25.7*  MCV 91.1 89.5 91.1 89.9 88.7 86.5  PLT 227 224 171 175 197 283   Basic Metabolic Panel:  Recent Labs Lab 11/24/15 0515 11/25/15 0459 11/26/15 0508 11/27/15 0518 11/28/15 0522  NA 141 140 142 139 139  K 4.5 4.9 4.8 4.5 4.1  CL 112* 114* 117* 114* 111  CO2 19* 18* 18* 16* 19*  GLUCOSE 87 147* 87 105* 92  BUN 69* 80* 72* 55* 45*  CREATININE 3.76* 4.61* 3.83* 3.11* 2.76*  CALCIUM 9.1 8.4* 8.8* 8.8* 9.0   GFR: CrCl cannot be calculated (Unknown ideal weight.). Liver Function Tests: No results for input(s): AST, ALT, ALKPHOS, BILITOT, PROT, ALBUMIN in the last 168 hours. No results for input(s): LIPASE, AMYLASE in the last 168 hours. No results for input(s): AMMONIA in the last 168 hours. Coagulation Profile: No results for input(s): INR, PROTIME in the last 168 hours. Cardiac Enzymes: No results for input(s): CKTOTAL, CKMB, CKMBINDEX, TROPONINI in the last 168 hours. BNP (last 3 results) No results for input(s): PROBNP in the last 8760 hours. HbA1C: No results for input(s): HGBA1C in the last 72 hours. CBG:  Recent Labs Lab 11/27/15 1119 11/27/15 1708 11/27/15 2046 11/28/15 0749 11/28/15 1127  GLUCAP 114* 93 89 96 94   Lipid Profile: No results for input(s): CHOL, HDL, LDLCALC, TRIG, CHOLHDL, LDLDIRECT in the last 72 hours. Thyroid Function Tests: No results for input(s): TSH, T4TOTAL, FREET4, T3FREE, THYROIDAB in the last 72 hours. Anemia Panel:  Recent Labs  11/25/15 1259  FERRITIN 218  TIBC 157*   IRON 10*   Sepsis Labs: No results for input(s): PROCALCITON, LATICACIDVEN in the last 168 hours.  Recent Results (from the past 240 hour(s))  Urine culture     Status: Abnormal   Collection Time: 11/23/15 12:31 PM  Result Value Ref Range Status   Specimen Description URINE, RANDOM  Final   Special Requests NONE  Final   Culture (A)  Final    <10,000 COLONIES/mL INSIGNIFICANT GROWTH Performed at [facility]    Report Status 11/25/2015 FINAL  Final  Culture, blood (routine x 2)     Status: None (Preliminary result)   Collection Time: 11/25/15  4:59 AM  Result Value Ref Range Status   Specimen Description BLOOD LEFT ANTECUBITAL  Final   Special Requests IN PEDIATRIC BOTTLE 2ML  Final   Culture   Final    NO GROWTH 3 DAYS Performed at [facility]    Report Status PENDING  Incomplete  Culture, blood (routine x 2)  Status: None (Preliminary result)   Collection Time: 11/25/15  4:59 AM  Result Value Ref Range Status   Specimen Description BLOOD RIGHT HAND  Final   Special Requests IN PEDIATRIC BOTTLE 2ML  Final   Culture   Final    NO GROWTH 3 DAYS Performed at [facility]    Report Status PENDING  Incomplete         Radiology Studies: No results found.      Scheduled Meds: . amLODipine  5 mg Oral Daily  . aspirin EC  81 mg Oral Daily  . ciprofloxacin  500 mg Oral Q breakfast  . clopidogrel  75 mg Oral Daily  . ferrous sulfate  325 mg Oral BID WC  . hydrALAZINE  25 mg Oral Q8H  . insulin aspart  0-9 Units Subcutaneous TID WC  . insulin glargine  10 Units Subcutaneous QHS  . metoprolol succinate  50 mg Oral Daily  . mometasone-formoterol  2 puff Inhalation BID  . pantoprazole  40 mg Oral Daily  . polyethylene glycol  17 g Oral Daily  . pregabalin  50 mg Oral Daily  . senna-docusate  1 tablet Oral BID  . sodium bicarbonate  650 mg Oral BID  . sodium chloride flush  3 mL Intravenous Q12H   Continuous Infusions:     LOS: 5 days      Time spent:27 minutes    Estanislado Surgeon, MD PhD [facility] Pager [phone]  If 7PM-7AM, please contact night-coverage [email or web address] Password [facility] 11/28/2015, 11:44 AM

## 2015-11-28 NOTE — Progress Notes (Signed)
Pharmacy Antibiotic Note  Michelle Holmes is a 65 y.o. female admitted on 11/23/2015 with UTI.  She is confused at baseline. She also has CKD with estimated GFR~10. Pharmacy has been consulted for Cipro renal dosing. Discussed patient with Dr Carolin Sicks.  Of note, she has been on Cipro since admission & is clinically improving.  Concerns for using Cipro include baseline confusion and CKD with estimated GFR~10. Given her hx of enterococcal UTI (sens LVQ; Cipro has variable coverage against enterococcus but likely adequate coverage in urine), noted PCN allergy (hives), and clinical improvement on 48h of Cipro, it is likely best option to continue for her pending cx data (currently negative).    Today, 11/28/2015:  Cultures remain negative, renal function improving; plan to continue Cipro.  Urology recommends complete 5-7d course and recheck U/A afterward  Plan:  Continue Cipro 500mg  po q24h  F/u LOT; 7 days of therapy would be completed tomorrow, 11/3  Weight:  (No Weight scale in the bed and patient unable to stand.)  Temp (24hrs), Avg:98.9 F (37.2 C), Min:98.3 F (36.8 C), Max:99.4 F (37.4 C)   Recent Labs Lab 11/24/15 0515 11/25/15 0459 11/26/15 0508 11/27/15 0518 11/28/15 0522  WBC 10.0 12.3* 7.5 7.0 7.2  CREATININE 3.76* 4.61* 3.83* 3.11* 2.76*    CrCl cannot be calculated (Unknown ideal weight.).    Allergies  Allergen Reactions  . Penicillins Hives and Swelling    Has patient had a PCN reaction causing immediate rash, facial/tongue/throat swelling, SOB or lightheadedness with hypotension: yes Has patient had a PCN reaction causing severe rash involving mucus membranes or skin necrosis: no Has patient had a PCN reaction that required hospitalization no Has patient had a PCN reaction occurring within the last 10 years: no If all of the above answers are "NO", then may proceed with Cephalosporin use.     Antimicrobials this admission: Cipro 10/28 >>    Dose  adjustments this admission: 10/30: Cipro decreased to q24h   Microbiology results: 10/30 BCx: ng3d  10/28 UCx: <10K col/mL, insignificant growth - F 07/04/15 UCx: enterococcus (pan-sens)   Thank you for allowing pharmacy to be a part of this patient's care.  Reuel Boom, PharmD, BCPS Pager: [phone] 11/28/2015, 2:03 PM

## 2015-11-28 NOTE — Telephone Encounter (Signed)
Made in error

## 2015-11-29 LAB — GLUCOSE, CAPILLARY
Glucose-Capillary: 95 mg/dL (ref 65–99)
Glucose-Capillary: 113 mg/dL — ABNORMAL HIGH (ref 65–99)
Glucose-Capillary: 111 mg/dL — ABNORMAL HIGH (ref 65–99)

## 2015-11-29 LAB — BASIC METABOLIC PANEL
Anion gap: 10 (ref 5–15)
BUN: 39 mg/dL — ABNORMAL HIGH (ref 6–20)
CO2: 20 mmol/L — ABNORMAL LOW (ref 22–32)
Calcium: 9.1 mg/dL (ref 8.9–10.3)
Chloride: 111 mmol/L (ref 101–111)
Creatinine, Ser: 2.55 mg/dL — ABNORMAL HIGH (ref 0.44–1.00)
GFR calc Af Amer: 22 mL/min — ABNORMAL LOW (ref >60)
GFR calc non Af Amer: 19 mL/min — ABNORMAL LOW (ref >60)
Glucose, Bld: 99 mg/dL (ref 65–99)
Potassium: 3.8 mmol/L (ref 3.5–5.1)
Sodium: 141 mmol/L (ref 135–145)

## 2015-11-29 MED ORDER — POLYETHYLENE GLYCOL 3350 17 G PO PACK: 17.0000 g | PACK | Freq: Every day | ORAL | 0 refills | Status: DC

## 2015-11-29 MED ORDER — FUROSEMIDE 20 MG PO TABS: 20.0000 mg | ORAL_TABLET | ORAL | 5 refills | Status: DC

## 2015-11-29 MED ORDER — FERROUS SULFATE 325 (65 FE) MG PO TABS: 325.0000 mg | ORAL_TABLET | Freq: Every day | ORAL | 3 refills | Status: DC

## 2015-11-29 MED ORDER — INSULIN GLARGINE 100 UNIT/ML SOLOSTAR PEN: 10.0000 [IU] | PEN_INJECTOR | Freq: Every day | SUBCUTANEOUS | 0 refills | Status: DC

## 2015-11-29 MED ORDER — SODIUM BICARBONATE 650 MG PO TABS: 650.0000 mg | ORAL_TABLET | Freq: Every day | ORAL | 0 refills | Status: DC

## 2015-11-29 MED ORDER — SENNOSIDES-DOCUSATE SODIUM 8.6-50 MG PO TABS: 1.0000 | ORAL_TABLET | Freq: Every day | ORAL | 0 refills | Status: DC

## 2015-11-29 MED ORDER — ASPIRIN 81 MG PO TBEC: 81.0000 mg | DELAYED_RELEASE_TABLET | Freq: Every day | ORAL | 0 refills | Status: DC

## 2015-11-29 NOTE — Progress Notes (Signed)
No changes in earlier shift assessment. Patient is resting comfortably and appears to be in no distress. Continues to refuse CPAP. Breathing unlabored, head of bed elevated. Will continue to monitor.

## 2015-11-29 NOTE — Progress Notes (Signed)
Patient and daughter given discharge instructions, and verbalized an understanding of all discharge instructions.  Patient and daughter agrees with discharge plan, and is being discharged in stable medical condition.  Patient to be transported by [facility]

## 2015-11-29 NOTE — Discharge Summary (Signed)
Discharge Summary  Michelle Holmes URK:[id number] DOB: 1950/11/12  PCP: Michelle Hey, MD  Admit date: 11/23/2015 Discharge date: 11/29/2015  Time spent: >5mins  Recommendations for Outpatient Follow-up:  1. F/u with PMD within a week  for hospital discharge follow up, repeat cbc/bmp at follow up 2. F/u with urology for foley management  Discharge Diagnoses:  Active Hospital Problems   Diagnosis Date Noted  . Anemia   . Acute urinary retention   . Acute kidney injury superimposed on chronic kidney disease ([place])   . Pressure injury of skin 11/24/2015  . Acute metabolic encephalopathy   . Acute renal failure (ARF) ([place]) 11/23/2015    Resolved Hospital Problems   Diagnosis Date Noted Date Resolved  No resolved problems to display.    Discharge Condition: stable  Diet recommendation: heart healthy/carb modified  Filed Weights    History of present illness:  Referring MD/NP/PA: Dr. Lacinda Axon  PCP: Michelle Hey, MD   Patient coming from: From home, arrived via EMS  Chief Complaint: confusion   HPI: Michelle Holmes is a 65 y.o. female with HTN, HLD, DM type II insulin dependent, who presented via EMS with confusion, no family at bedside at the time of the arrival to the ED but daughter was present upon my arrival. Please note that pt was unable to provide much of the meaningful hx and daughter at bedside explained that mother was confused and not making sense. No specific abd or urinary concerns noted but daughter noted pt was not eating much and is not sure if she has been taking medications correctly. No reported chest pain, dyspnea, fevers.   ED Course: Pt hemodynamically stable, Blood work notable for Cr up to 3, UA worrisome for UTI. Pt started on Cipro and TRH asked to admit for further evaluation.   Hospital Course:  Active Problems:   Acute renal failure (ARF) ([facility])   Pressure injury of skin   Acute metabolic encephalopathy   Anemia   Acute urinary  retention   Acute kidney injury superimposed on chronic kidney disease ([place])   # Acute encephalopathy likely in the setting of UTI and renal failure:  Patient has no focal neurological deficit. CT scan of head showed no acute finding. TSH level acceptable.  -now more alert, family report patient back to her baseline at discharge she knows she is in the hospital, not able to name the hospital, no oriented to time, per daughter this is at her baseline, per daughter patient can pivot with helps but not able to get up from bed by herself and not able to walk, per daughter, patient has all the DME needed at home and has good care at home -PT, OT evaluation. PT recommended SNF and 24-hour supervision. Patient and family declining SNF. She will be discharged home with home care   #Acute cystitis without hematuria: she finsied ciprofloxacin treatment in the hospital. Urine culture has no significant growth. Prior urine culture grew enterococcus.   #Acute on chronic kidney disease stage III  in the setting of acute urinary retention and UTI:  - Ultrasound of kidneys consistent with nephrolithiasis without hydronephrosis.  -She received IVF initially. Serum creatinine level trending down, at baseline on day of discharge, good urine output - She will need Foley catheter on discharge and outpatientollow-up with urology. Urology consult appreciated. - home meds lasix changed from 20mg  daily to 20mg  every MWF  Urinary retention:  likely due to pain meds and constipation,  He home meds oxycontine bid  was discontinued on admission, She did not need frequent prn pain meds in the hospital. She is discharged on prn norco and stool softener Foley in place, urology follow up   # Anemia:  Multifactorial etiology including iron deficiency anemia and in the setting of kidney failure. Patient has no sign of bleeding. She is currently on oral iron supplement. Not planning to give IV iron because of ongoing  infection.  No need of transfusion in the hospital She is continued on iron supplement, and pmd follow up.  #  Pressure injury of skin, on the back: Supportive care  #Essential hypertension:  continue  Norvasc and  metoprolol , pmd to continue monitor blood pressure      #Type 2 diabetes on chronic insulin: carb modified diet. Continue current insulin regimen, lantus 10units qhs. a1c 6.6.    Code Status: Full code Family Communication: daughter denise over the phone, a friend in room Disposition Plan:  home with home care on 11/3.    Consultants:   None  Procedures: None Antimicrobials: Ciprofloxacin started since October 29, she finished total of 7 days  treatment    Discharge Exam: BP (!) 155/82 (BP Location: Left Arm)   Pulse 78   Temp 98.7 F (37.1 C) (Oral)   Resp 18   SpO2 98%   General: more alert, she knows she is in the hospital, not able to name the hospital, no oriented to time, per daughter this is at her baseline, per daughter patient can pivot with helps but not able to get up from bed by herself and not able to walk, per daughter, patient has all the DME needed at home and has good care at home Cardiovascular: RRR Respiratory: CTABL Extremity: bilateral ankle edema has resolved at discharge She is discharged on foley catheter  Discharge Instructions You were cared for by a hospitalist during your hospital stay. If you have any questions about your discharge medications or the care you received while you were in the hospital after you are discharged, you can call the unit and asked to speak with the hospitalist on call if the hospitalist that took care of you is not available. Once you are discharged, your primary care physician will handle any further medical issues. Please note that NO REFILLS for any discharge medications will be authorized once you are discharged, as it is imperative that you return to your primary care physician (or establish  a relationship with a primary care physician if you do not have one) for your aftercare needs so that they can reassess your need for medications and monitor your lab values.  Discharge Instructions    Diet - low sodium heart healthy    Complete by:  As directed    Carb modified   Face-to-face encounter (required for Medicare/Medicaid patients)    Complete by:  As directed    I Mercedes Fort certify that this patient is under my care and that I, or a nurse practitioner or physician's assistant working with me, had a face-to-face encounter that meets the physician face-to-face encounter requirements with this patient on 11/29/2015. The encounter with the patient was in whole, or in part for the following medical condition(s) which is the primary reason for home health care (List medical condition): FTT   The encounter with the patient was in whole, or in part, for the following medical condition, which is the primary reason for home health care:  FTT   I certify that, based on my findings, the  following services are medically necessary home health services:   Nursing Physical therapy     Reason for Medically Necessary Home Health Services:  Skilled Nursing- Change/Decline in Patient Status   My clinical findings support the need for the above services:  Cognitive impairments, dementia, or mental confusion  that make it unsafe to leave home   Further, I certify that my clinical findings support that this patient is homebound due to:  Mental confusion   Home Health    Complete by:  As directed    To provide the following care/treatments:   PT OT RN Home Health Aide     Increase activity slowly    Complete by:  As directed        Medication List    STOP taking these medications   glipiZIDE 5 MG tablet Commonly known as:  GLUCOTROL   Oxycodone HCl 20 MG Tabs   pantoprazole 40 MG tablet Commonly known as:  PROTONIX     TAKE these medications   ADVAIR DISKUS 250-50 MCG/DOSE Aepb Generic  drug:  Fluticasone-Salmeterol Inhale 1 puff into the lungs daily as needed (shortness of breath).   amLODipine 5 MG tablet Commonly known as:  NORVASC Take 5 mg by mouth at bedtime.   aspirin 81 MG EC tablet Take 1 tablet (81 mg total) by mouth daily. Start taking on:  11/30/2015 What changed:  medication strength  how much to take   atorvastatin 80 MG tablet Commonly known as:  LIPITOR Take 1 tablet (80 mg total) by mouth daily at 6 PM.   clopidogrel 75 MG tablet Commonly known as:  PLAVIX Take 1 tablet (75 mg total) by mouth daily.   ferrous sulfate 325 (65 FE) MG tablet Take 1 tablet (325 mg total) by mouth daily with breakfast. What changed:  when to take this   furosemide 20 MG tablet Commonly known as:  LASIX Take 1 tablet (20 mg total) by mouth every Monday, Wednesday, and Friday. What changed:  See the new instructions.   HYDROcodone-acetaminophen 5-325 MG tablet Commonly known as:  NORCO/VICODIN Take 1 tablet by mouth every 4 (four) hours as needed for severe pain.   Insulin Glargine 100 UNIT/ML Solostar Pen Commonly known as:  LANTUS SOLOSTAR Inject 10 Units into the skin at bedtime. What changed:  how much to take   LYRICA 50 MG capsule Generic drug:  pregabalin Take 1 capsule (50 mg total) by mouth daily.   metoprolol succinate 50 MG 24 hr tablet Commonly known as:  TOPROL-XL Take 50 mg by mouth daily. Take with or immediately following a meal.   polyethylene glycol packet Commonly known as:  MIRALAX / GLYCOLAX Take 17 g by mouth daily. Do not take if diarrhea Start taking on:  11/30/2015   senna-docusate 8.6-50 MG tablet Commonly known as:  Senokot-S Take 1 tablet by mouth at bedtime. Do not take if diarrhea   sodium bicarbonate 650 MG tablet Take 1 tablet (650 mg total) by mouth daily.   traMADol 50 MG tablet Commonly known as:  ULTRAM Take 1 tablet (50 mg total) by mouth every 12 (twelve) hours as needed. What changed:  reasons to take  this      Allergies  Allergen Reactions  . Penicillins Hives and Swelling    Has patient had a PCN reaction causing immediate rash, facial/tongue/throat swelling, SOB or lightheadedness with hypotension: yes Has patient had a PCN reaction causing severe rash involving mucus membranes or skin necrosis: no Has patient  had a PCN reaction that required hospitalization no Has patient had a PCN reaction occurring within the last 10 years: no If all of the above answers are "NO", then may proceed with Cephalosporin use.    Follow-up Information    Malka So, MD. Schedule an appointment as soon as possible for a visit in 3 week(s).   Specialty:  Urology Why:  for voiding trial and catheter exchange if needed.  Contact information: [place] [address] [phone]        [facility] .   Contact information: [place] [address] [phone]        Michelle Hey, MD Follow up in 1 week(s).   Specialty:  Family Medicine Why:  hospital discharge follow up, repeat cbc/bmp at follow up, pmd to monitor iron deficiency anemia Contact information: [address] [place] [address] [phone]            The results of significant diagnostics from this hospitalization (including imaging, microbiology, ancillary and laboratory) are listed below for reference.    Significant Diagnostic Studies: Ct Head Wo Contrast  Result Date: 11/23/2015 CLINICAL DATA:  Altered mental status. EXAM: CT HEAD WITHOUT CONTRAST TECHNIQUE: Contiguous axial images were obtained from the base of the skull through the vertex without intravenous contrast. COMPARISON:  CT scan of September 26, 2015. 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 Mild chronic ischemic white matter disease is noted. No mass effect or midline shift is noted. Ventricular size is within normal limits. There is no evidence of mass lesion, hemorrhage or  acute infarction. Vascular: Atherosclerosis of carotid siphons is noted. Skull: Bony calvarium appears intact. Sinuses/Orbits: Paranasal sinuses are unremarkable. Other: None. IMPRESSION: Mild chronic ischemic white matter disease. 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 No acute intracranial abnormality seen. Electronically Signed   By: Marijo Conception, M.D.   On: 11/23/2015 17:57   US Renal  Result Date: 11/25/2015 CLINICAL DATA:  Renal failure EXAM: RENAL / URINARY TRACT ULTRASOUND COMPLETE COMPARISON:  CT abdomen/pelvis dated 03/21/2015 FINDINGS: Right Kidney: Length: 9.2 cm.  9 mm upper pole calculus.  No hydronephrosis. Left Kidney: Length: 11.5 cm.  8 mm upper pole calculus.  No hydronephrosis. Bladder: Decompressed by indwelling Foley catheter. IMPRESSION: Bilateral renal calculi, measuring up to 9 mm.  No hydronephrosis. Bladder decompressed by indwelling Foley catheter. Electronically Signed   By: Julian Hy M.D.   On: 11/25/2015 09:17   Dg Chest Port 1 View  Result Date: 11/24/2015 CLINICAL DATA:  Acute onset of fever.  Initial encounter. EXAM: PORTABLE CHEST 1 VIEW COMPARISON:  Chest radiograph performed 07/04/2015 FINDINGS: The lungs are mildly hypoexpanded. Vascular congestion is noted. Mild bibasilar opacities may reflect atelectasis or pneumonia, depending on the patient's symptoms. There is no evidence of pleural effusion or pneumothorax. The cardiomediastinal silhouette is borderline enlarged. No acute osseous abnormalities are seen. Chronic degenerative change is noted at the left glenohumeral joint, with superior subluxation of the left humeral head. IMPRESSION: Lungs mildly hypoexpanded. Vascular congestion and borderline cardiomegaly. Mild bibasilar opacities may reflect atelectasis or possibly pneumonia, depending on the patient's symptoms. Electronically Signed   By: Garald Balding M.D.   On: 11/24/2015 23:03    Microbiology: Recent Results (from the past 240 hour(s))  Urine  culture     Status: Abnormal   Collection Time: 11/23/15 12:31 PM  Result Value Ref Range Status   Specimen Description URINE, RANDOM  Final   Special  Requests NONE  Final   Culture (A)  Final    <10,000 COLONIES/mL INSIGNIFICANT GROWTH Performed at [facility]    Report Status 11/25/2015 FINAL  Final  Culture, blood (routine x 2)     Status: None (Preliminary result)   Collection Time: 11/25/15  4:59 AM  Result Value Ref Range Status   Specimen Description BLOOD LEFT ANTECUBITAL  Final   Special Requests IN PEDIATRIC BOTTLE 2ML  Final   Culture   Final    NO GROWTH 3 DAYS Performed at [facility]    Report Status PENDING  Incomplete  Culture, blood (routine x 2)     Status: None (Preliminary result)   Collection Time: 11/25/15  4:59 AM  Result Value Ref Range Status   Specimen Description BLOOD RIGHT HAND  Final   Special Requests IN PEDIATRIC BOTTLE 2ML  Final   Culture   Final    NO GROWTH 3 DAYS Performed at [facility]    Report Status PENDING  Incomplete     Labs: Basic Metabolic Panel:  Recent Labs Lab 11/25/15 0459 11/26/15 0508 11/27/15 0518 11/28/15 0522 11/29/15 0513  NA 140 142 139 139 141  K 4.9 4.8 4.5 4.1 3.8  CL 114* 117* 114* 111 111  CO2 18* 18* 16* 19* 20*  GLUCOSE 147* 87 105* 92 99  BUN 80* 72* 55* 45* 39*  CREATININE 4.61* 3.83* 3.11* 2.76* 2.55*  CALCIUM 8.4* 8.8* 8.8* 9.0 9.1   Liver Function Tests: No results for input(s): AST, ALT, ALKPHOS, BILITOT, PROT, ALBUMIN in the last 168 hours. No results for input(s): LIPASE, AMYLASE in the last 168 hours. No results for input(s): AMMONIA in the last 168 hours. CBC:  Recent Labs Lab 11/23/15 1100 11/24/15 0515 11/25/15 0459 11/26/15 0508 11/27/15 0518 11/28/15 0522  WBC 7.3 10.0 12.3* 7.5 7.0 7.2  NEUTROABS 4.8  --   --   --   --   --   HGB 8.7* 8.9* 7.6* 7.2* 8.3* 8.6*  HCT 27.5* 28.2* 23.6* 22.3* 25.0* 25.7*  MCV 91.1 89.5 91.1 89.9 88.7 86.5  PLT 227  224 171 175 197 205   Cardiac Enzymes: No results for input(s): CKTOTAL, CKMB, CKMBINDEX, TROPONINI in the last 168 hours. BNP: BNP (last 3 results) No results for input(s): BNP in the last 8760 hours.  ProBNP (last 3 results) No results for input(s): PROBNP in the last 8760 hours.  CBG:  Recent Labs Lab 11/28/15 1127 11/28/15 1647 11/28/15 2139 11/29/15 0742 11/29/15 1202  GLUCAP 94 111* 135* 95 113*       Signed:  Sybilla Malhotra MD, PhD  [facility] 11/29/2015, 1:06 PM

## 2015-11-29 NOTE — Progress Notes (Signed)
Notified by Corena Pilgrim NS that visitor in pt room upset.  Entered pt room, Kerrie Pleasure and Erlinda Hong MD at bedside, along with private aide and family friend.  Family friend verbalizing frustrations that pt states someone called her "crazy" and that pt is ready to go home and we should not hold her here.  MD explained to family friend she recently talked to pt daughter [name] and daughter states pt back to baseline and okay with discharge.  Family friend appeared more content after speaking with MD. No further concerns at this time.

## 2015-11-30 DIAGNOSIS — Z9981 Dependence on supplemental oxygen: Secondary | ICD-10-CM | POA: Diagnosis not present

## 2015-11-30 DIAGNOSIS — I129 Hypertensive chronic kidney disease with stage 1 through stage 4 chronic kidney disease, or unspecified chronic kidney disease: Secondary | ICD-10-CM | POA: Diagnosis not present

## 2015-11-30 DIAGNOSIS — N3000 Acute cystitis without hematuria: Secondary | ICD-10-CM | POA: Diagnosis not present

## 2015-11-30 DIAGNOSIS — N183 Chronic kidney disease, stage 3 (moderate): Secondary | ICD-10-CM | POA: Diagnosis not present

## 2015-11-30 DIAGNOSIS — D508 Other iron deficiency anemias: Secondary | ICD-10-CM | POA: Diagnosis not present

## 2015-11-30 DIAGNOSIS — Z960 Presence of urogenital implants: Secondary | ICD-10-CM | POA: Diagnosis not present

## 2015-11-30 DIAGNOSIS — Z794 Long term (current) use of insulin: Secondary | ICD-10-CM | POA: Diagnosis not present

## 2015-11-30 DIAGNOSIS — E1122 Type 2 diabetes mellitus with diabetic chronic kidney disease: Secondary | ICD-10-CM | POA: Diagnosis not present

## 2015-11-30 DIAGNOSIS — R339 Retention of urine, unspecified: Secondary | ICD-10-CM | POA: Diagnosis not present

## 2015-11-30 LAB — CULTURE, BLOOD (ROUTINE X 2)
Culture: NO GROWTH
Culture: NO GROWTH

## 2015-12-03 ENCOUNTER — Other Ambulatory Visit: Payer: Self-pay | Admitting: Physical Medicine & Rehabilitation

## 2015-12-03 DIAGNOSIS — Z9981 Dependence on supplemental oxygen: Secondary | ICD-10-CM | POA: Diagnosis not present

## 2015-12-03 DIAGNOSIS — E1122 Type 2 diabetes mellitus with diabetic chronic kidney disease: Secondary | ICD-10-CM | POA: Diagnosis not present

## 2015-12-03 DIAGNOSIS — N183 Chronic kidney disease, stage 3 (moderate): Secondary | ICD-10-CM | POA: Diagnosis not present

## 2015-12-03 DIAGNOSIS — D508 Other iron deficiency anemias: Secondary | ICD-10-CM | POA: Diagnosis not present

## 2015-12-03 DIAGNOSIS — Z794 Long term (current) use of insulin: Secondary | ICD-10-CM | POA: Diagnosis not present

## 2015-12-03 DIAGNOSIS — Z960 Presence of urogenital implants: Secondary | ICD-10-CM | POA: Diagnosis not present

## 2015-12-03 DIAGNOSIS — N3000 Acute cystitis without hematuria: Secondary | ICD-10-CM | POA: Diagnosis not present

## 2015-12-03 DIAGNOSIS — R339 Retention of urine, unspecified: Secondary | ICD-10-CM | POA: Diagnosis not present

## 2015-12-03 DIAGNOSIS — I129 Hypertensive chronic kidney disease with stage 1 through stage 4 chronic kidney disease, or unspecified chronic kidney disease: Secondary | ICD-10-CM | POA: Diagnosis not present

## 2015-12-04 NOTE — Telephone Encounter (Signed)
Called tramadol to pharmacy

## 2015-12-05 DIAGNOSIS — N183 Chronic kidney disease, stage 3 (moderate): Secondary | ICD-10-CM | POA: Diagnosis not present

## 2015-12-05 DIAGNOSIS — Z9981 Dependence on supplemental oxygen: Secondary | ICD-10-CM | POA: Diagnosis not present

## 2015-12-05 DIAGNOSIS — R339 Retention of urine, unspecified: Secondary | ICD-10-CM | POA: Diagnosis not present

## 2015-12-05 DIAGNOSIS — Z794 Long term (current) use of insulin: Secondary | ICD-10-CM | POA: Diagnosis not present

## 2015-12-05 DIAGNOSIS — E1122 Type 2 diabetes mellitus with diabetic chronic kidney disease: Secondary | ICD-10-CM | POA: Diagnosis not present

## 2015-12-05 DIAGNOSIS — N3000 Acute cystitis without hematuria: Secondary | ICD-10-CM | POA: Diagnosis not present

## 2015-12-05 DIAGNOSIS — D508 Other iron deficiency anemias: Secondary | ICD-10-CM | POA: Diagnosis not present

## 2015-12-05 DIAGNOSIS — I129 Hypertensive chronic kidney disease with stage 1 through stage 4 chronic kidney disease, or unspecified chronic kidney disease: Secondary | ICD-10-CM | POA: Diagnosis not present

## 2015-12-05 DIAGNOSIS — Z960 Presence of urogenital implants: Secondary | ICD-10-CM | POA: Diagnosis not present

## 2015-12-06 DIAGNOSIS — R339 Retention of urine, unspecified: Secondary | ICD-10-CM | POA: Diagnosis not present

## 2015-12-06 DIAGNOSIS — Z794 Long term (current) use of insulin: Secondary | ICD-10-CM | POA: Diagnosis not present

## 2015-12-06 DIAGNOSIS — I129 Hypertensive chronic kidney disease with stage 1 through stage 4 chronic kidney disease, or unspecified chronic kidney disease: Secondary | ICD-10-CM | POA: Diagnosis not present

## 2015-12-06 DIAGNOSIS — N3000 Acute cystitis without hematuria: Secondary | ICD-10-CM | POA: Diagnosis not present

## 2015-12-06 DIAGNOSIS — Z960 Presence of urogenital implants: Secondary | ICD-10-CM | POA: Diagnosis not present

## 2015-12-06 DIAGNOSIS — D508 Other iron deficiency anemias: Secondary | ICD-10-CM | POA: Diagnosis not present

## 2015-12-06 DIAGNOSIS — N183 Chronic kidney disease, stage 3 (moderate): Secondary | ICD-10-CM | POA: Diagnosis not present

## 2015-12-06 DIAGNOSIS — E1122 Type 2 diabetes mellitus with diabetic chronic kidney disease: Secondary | ICD-10-CM | POA: Diagnosis not present

## 2015-12-06 DIAGNOSIS — Z9981 Dependence on supplemental oxygen: Secondary | ICD-10-CM | POA: Diagnosis not present

## 2015-12-09 DIAGNOSIS — I129 Hypertensive chronic kidney disease with stage 1 through stage 4 chronic kidney disease, or unspecified chronic kidney disease: Secondary | ICD-10-CM | POA: Diagnosis not present

## 2015-12-09 DIAGNOSIS — N3000 Acute cystitis without hematuria: Secondary | ICD-10-CM | POA: Diagnosis not present

## 2015-12-09 DIAGNOSIS — D508 Other iron deficiency anemias: Secondary | ICD-10-CM | POA: Diagnosis not present

## 2015-12-09 DIAGNOSIS — Z794 Long term (current) use of insulin: Secondary | ICD-10-CM | POA: Diagnosis not present

## 2015-12-09 DIAGNOSIS — E1122 Type 2 diabetes mellitus with diabetic chronic kidney disease: Secondary | ICD-10-CM | POA: Diagnosis not present

## 2015-12-09 DIAGNOSIS — R339 Retention of urine, unspecified: Secondary | ICD-10-CM | POA: Diagnosis not present

## 2015-12-09 DIAGNOSIS — N183 Chronic kidney disease, stage 3 (moderate): Secondary | ICD-10-CM | POA: Diagnosis not present

## 2015-12-09 DIAGNOSIS — Z9981 Dependence on supplemental oxygen: Secondary | ICD-10-CM | POA: Diagnosis not present

## 2015-12-09 DIAGNOSIS — Z960 Presence of urogenital implants: Secondary | ICD-10-CM | POA: Diagnosis not present

## 2015-12-10 ENCOUNTER — Telehealth: Payer: Self-pay

## 2015-12-10 ENCOUNTER — Ambulatory Visit: Payer: Medicare Other | Admitting: Physical Medicine & Rehabilitation

## 2015-12-10 ENCOUNTER — Ambulatory Visit: Payer: Self-pay | Admitting: Neurology

## 2015-12-10 NOTE — Telephone Encounter (Signed)
Pt did not show for their appt with Dr. Dohmeier today.  

## 2015-12-11 ENCOUNTER — Observation Stay (HOSPITAL_COMMUNITY)
Admission: EM | Admit: 2015-12-11 | Discharge: 2015-12-14 | Disposition: A | Discharge status: Home / Self Care | Payer: Medicare Other | Attending: Internal Medicine | Admitting: Internal Medicine

## 2015-12-11 ENCOUNTER — Encounter: Payer: Self-pay | Admitting: Neurology

## 2015-12-11 ENCOUNTER — Encounter (HOSPITAL_COMMUNITY): Payer: Self-pay | Admitting: Emergency Medicine

## 2015-12-11 DIAGNOSIS — I6789 Other cerebrovascular disease: Secondary | ICD-10-CM | POA: Diagnosis not present

## 2015-12-11 DIAGNOSIS — I509 Heart failure, unspecified: Secondary | ICD-10-CM | POA: Insufficient documentation

## 2015-12-11 DIAGNOSIS — T670XXA Heatstroke and sunstroke, initial encounter: Secondary | ICD-10-CM | POA: Diagnosis not present

## 2015-12-11 DIAGNOSIS — I252 Old myocardial infarction: Secondary | ICD-10-CM | POA: Diagnosis not present

## 2015-12-11 DIAGNOSIS — R112 Nausea with vomiting, unspecified: Secondary | ICD-10-CM

## 2015-12-11 DIAGNOSIS — Z87891 Personal history of nicotine dependence: Secondary | ICD-10-CM | POA: Insufficient documentation

## 2015-12-11 DIAGNOSIS — E1140 Type 2 diabetes mellitus with diabetic neuropathy, unspecified: Secondary | ICD-10-CM | POA: Insufficient documentation

## 2015-12-11 DIAGNOSIS — J449 Chronic obstructive pulmonary disease, unspecified: Secondary | ICD-10-CM | POA: Diagnosis not present

## 2015-12-11 DIAGNOSIS — D508 Other iron deficiency anemias: Secondary | ICD-10-CM | POA: Diagnosis not present

## 2015-12-11 DIAGNOSIS — Z955 Presence of coronary angioplasty implant and graft: Secondary | ICD-10-CM | POA: Diagnosis not present

## 2015-12-11 DIAGNOSIS — Z8673 Personal history of transient ischemic attack (TIA), and cerebral infarction without residual deficits: Secondary | ICD-10-CM | POA: Diagnosis not present

## 2015-12-11 DIAGNOSIS — Z7902 Long term (current) use of antithrombotics/antiplatelets: Secondary | ICD-10-CM | POA: Insufficient documentation

## 2015-12-11 DIAGNOSIS — I2510 Atherosclerotic heart disease of native coronary artery without angina pectoris: Secondary | ICD-10-CM | POA: Diagnosis not present

## 2015-12-11 DIAGNOSIS — I129 Hypertensive chronic kidney disease with stage 1 through stage 4 chronic kidney disease, or unspecified chronic kidney disease: Secondary | ICD-10-CM | POA: Diagnosis not present

## 2015-12-11 DIAGNOSIS — R1110 Vomiting, unspecified: Secondary | ICD-10-CM | POA: Diagnosis present

## 2015-12-11 DIAGNOSIS — I130 Hypertensive heart and chronic kidney disease with heart failure and stage 1 through stage 4 chronic kidney disease, or unspecified chronic kidney disease: Secondary | ICD-10-CM | POA: Insufficient documentation

## 2015-12-11 DIAGNOSIS — Z7982 Long term (current) use of aspirin: Secondary | ICD-10-CM | POA: Insufficient documentation

## 2015-12-11 DIAGNOSIS — Z960 Presence of urogenital implants: Secondary | ICD-10-CM | POA: Diagnosis not present

## 2015-12-11 DIAGNOSIS — N184 Chronic kidney disease, stage 4 (severe): Secondary | ICD-10-CM | POA: Insufficient documentation

## 2015-12-11 DIAGNOSIS — E1122 Type 2 diabetes mellitus with diabetic chronic kidney disease: Secondary | ICD-10-CM | POA: Diagnosis not present

## 2015-12-11 DIAGNOSIS — N183 Chronic kidney disease, stage 3 (moderate): Secondary | ICD-10-CM | POA: Diagnosis not present

## 2015-12-11 DIAGNOSIS — Z9981 Dependence on supplemental oxygen: Secondary | ICD-10-CM | POA: Diagnosis not present

## 2015-12-11 DIAGNOSIS — Z794 Long term (current) use of insulin: Secondary | ICD-10-CM | POA: Diagnosis not present

## 2015-12-11 DIAGNOSIS — R339 Retention of urine, unspecified: Secondary | ICD-10-CM | POA: Diagnosis not present

## 2015-12-11 DIAGNOSIS — N189 Chronic kidney disease, unspecified: Secondary | ICD-10-CM

## 2015-12-11 DIAGNOSIS — I6930 Unspecified sequelae of cerebral infarction: Secondary | ICD-10-CM

## 2015-12-11 DIAGNOSIS — Z79899 Other long term (current) drug therapy: Secondary | ICD-10-CM | POA: Insufficient documentation

## 2015-12-11 DIAGNOSIS — N179 Acute kidney failure, unspecified: Secondary | ICD-10-CM | POA: Diagnosis not present

## 2015-12-11 DIAGNOSIS — R1111 Vomiting without nausea: Secondary | ICD-10-CM | POA: Diagnosis not present

## 2015-12-11 DIAGNOSIS — R197 Diarrhea, unspecified: Secondary | ICD-10-CM | POA: Insufficient documentation

## 2015-12-11 DIAGNOSIS — N3000 Acute cystitis without hematuria: Secondary | ICD-10-CM | POA: Diagnosis not present

## 2015-12-11 LAB — CBC WITH DIFFERENTIAL/PLATELET
Basophils Absolute: 0.1 10*3/uL (ref 0.0–0.1)
Basophils Relative: 0 %
Eosinophils Absolute: 0.2 10*3/uL (ref 0.0–0.7)
Eosinophils Relative: 1 %
HCT: 32.6 % — ABNORMAL LOW (ref 36.0–46.0)
Hemoglobin: 10.6 g/dL — ABNORMAL LOW (ref 12.0–15.0)
Lymphocytes Relative: 15 %
Lymphs Abs: 2.1 10*3/uL (ref 0.7–4.0)
MCH: 28.6 pg (ref 26.0–34.0)
MCHC: 32.5 g/dL (ref 30.0–36.0)
MCV: 88.1 fL (ref 78.0–100.0)
Monocytes Absolute: 1.1 10*3/uL — ABNORMAL HIGH (ref 0.1–1.0)
Monocytes Relative: 8 %
Neutro Abs: 10.8 10*3/uL — ABNORMAL HIGH (ref 1.7–7.7)
Neutrophils Relative %: 76 %
Platelets: 387 10*3/uL (ref 150–400)
RBC: 3.7 MIL/uL — ABNORMAL LOW (ref 3.87–5.11)
RDW: 13.5 % (ref 11.5–15.5)
WBC: 14.2 10*3/uL — ABNORMAL HIGH (ref 4.0–10.5)

## 2015-12-11 LAB — HEPATIC FUNCTION PANEL
ALT: 15 U/L (ref 14–54)
AST: 16 U/L (ref 15–41)
Albumin: 3.7 g/dL (ref 3.5–5.0)
Alkaline Phosphatase: 103 U/L (ref 38–126)
Bilirubin, Direct: 0.2 mg/dL (ref 0.1–0.5)
Indirect Bilirubin: 0.3 mg/dL (ref 0.3–0.9)
Total Bilirubin: 0.5 mg/dL (ref 0.3–1.2)
Total Protein: 7.9 g/dL (ref 6.5–8.1)

## 2015-12-11 LAB — I-STAT CHEM 8, ED
BUN: 61 mg/dL — ABNORMAL HIGH (ref 6–20)
Calcium, Ion: 1.18 mmol/L (ref 1.15–1.40)
Chloride: 109 mmol/L (ref 101–111)
Creatinine, Ser: 3.6 mg/dL — ABNORMAL HIGH (ref 0.44–1.00)
Glucose, Bld: 180 mg/dL — ABNORMAL HIGH (ref 65–99)
HCT: 34 % — ABNORMAL LOW (ref 36.0–46.0)
Hemoglobin: 11.6 g/dL — ABNORMAL LOW (ref 12.0–15.0)
Potassium: 4.7 mmol/L (ref 3.5–5.1)
Sodium: 142 mmol/L (ref 135–145)
TCO2: 26 mmol/L (ref 0–100)

## 2015-12-11 LAB — URINE MICROSCOPIC-ADD ON

## 2015-12-11 LAB — URINALYSIS, ROUTINE W REFLEX MICROSCOPIC
Glucose, UA: NEGATIVE mg/dL
Ketones, ur: NEGATIVE mg/dL
Nitrite: NEGATIVE
Protein, ur: >300 mg/dL — AB
Specific Gravity, Urine: 1.022 (ref 1.005–1.030)
pH: 5.5 (ref 5.0–8.0)

## 2015-12-11 LAB — CREATININE, SERUM
Creatinine, Ser: 3.38 mg/dL — ABNORMAL HIGH (ref 0.44–1.00)
GFR calc Af Amer: 15 mL/min — ABNORMAL LOW (ref >60)
GFR calc non Af Amer: 13 mL/min — ABNORMAL LOW (ref >60)

## 2015-12-11 LAB — C DIFFICILE QUICK SCREEN W PCR REFLEX
C Diff antigen: NEGATIVE
C Diff interpretation: NOT DETECTED
C Diff toxin: NEGATIVE

## 2015-12-11 MED ORDER — ATORVASTATIN CALCIUM 40 MG PO TABS
40.0000 mg | ORAL_TABLET | Freq: Every day | ORAL | Status: DC
  Administered 2015-12-11 – 2015-12-13 (×2): 40 mg via ORAL
  Filled 2015-12-11 (×2): qty 1

## 2015-12-11 MED ORDER — METOPROLOL SUCCINATE ER 50 MG PO TB24
50.0000 mg | ORAL_TABLET | Freq: Every day | ORAL | Status: DC
  Administered 2015-12-12 – 2015-12-14 (×3): 50 mg via ORAL
  Filled 2015-12-11 (×3): qty 1

## 2015-12-11 MED ORDER — ONDANSETRON HCL 4 MG/2ML IJ SOLN
4.0000 mg | Freq: Four times a day (QID) | INTRAMUSCULAR | Status: DC | PRN
  Administered 2015-12-11: 4 mg via INTRAVENOUS
  Filled 2015-12-11: qty 2

## 2015-12-11 MED ORDER — ACETAMINOPHEN 650 MG RE SUPP: 650.0000 mg | Freq: Four times a day (QID) | RECTAL | Status: DC | PRN

## 2015-12-11 MED ORDER — ASPIRIN EC 81 MG PO TBEC
81.0000 mg | DELAYED_RELEASE_TABLET | Freq: Every day | ORAL | Status: DC
  Administered 2015-12-11 – 2015-12-14 (×3): 81 mg via ORAL
  Filled 2015-12-11 (×7): qty 1

## 2015-12-11 MED ORDER — ONDANSETRON 8 MG PO TBDP
8.0000 mg | ORAL_TABLET | Freq: Once | ORAL | Status: AC
  Administered 2015-12-11: 8 mg via ORAL
  Filled 2015-12-11: qty 1

## 2015-12-11 MED ORDER — PREGABALIN 50 MG PO CAPS
50.0000 mg | ORAL_CAPSULE | Freq: Two times a day (BID) | ORAL | Status: DC
  Administered 2015-12-11 – 2015-12-14 (×5): 50 mg via ORAL
  Filled 2015-12-11 (×6): qty 1

## 2015-12-11 MED ORDER — ACETAMINOPHEN 325 MG PO TABS: 650.0000 mg | ORAL_TABLET | Freq: Four times a day (QID) | ORAL | Status: DC | PRN

## 2015-12-11 MED ORDER — SODIUM CHLORIDE 0.9 % IV SOLN
INTRAVENOUS | Status: DC
  Administered 2015-12-12 – 2015-12-14 (×4): via INTRAVENOUS

## 2015-12-11 MED ORDER — INSULIN GLARGINE 100 UNIT/ML SOLOSTAR PEN: 10.0000 [IU] | PEN_INJECTOR | Freq: Every day | SUBCUTANEOUS | Status: DC

## 2015-12-11 MED ORDER — HYDROCODONE-ACETAMINOPHEN 5-325 MG PO TABS
1.0000 | ORAL_TABLET | ORAL | Status: DC | PRN
  Administered 2015-12-12 – 2015-12-14 (×3): 1 via ORAL
  Filled 2015-12-11 (×3): qty 1

## 2015-12-11 MED ORDER — CLOPIDOGREL BISULFATE 75 MG PO TABS
75.0000 mg | ORAL_TABLET | Freq: Every day | ORAL | Status: DC
  Administered 2015-12-11 – 2015-12-14 (×4): 75 mg via ORAL
  Filled 2015-12-11 (×4): qty 1

## 2015-12-11 MED ORDER — SODIUM BICARBONATE 650 MG PO TABS
650.0000 mg | ORAL_TABLET | Freq: Every day | ORAL | Status: DC
  Administered 2015-12-11 – 2015-12-14 (×3): 650 mg via ORAL
  Filled 2015-12-11 (×4): qty 1

## 2015-12-11 MED ORDER — SODIUM CHLORIDE 0.9 % IV BOLUS (SEPSIS)
500.0000 mL | Freq: Once | INTRAVENOUS | Status: AC
  Administered 2015-12-11: 500 mL via INTRAVENOUS

## 2015-12-11 MED ORDER — HEPARIN SODIUM (PORCINE) 5000 UNIT/ML IJ SOLN
5000.0000 [IU] | Freq: Three times a day (TID) | INTRAMUSCULAR | Status: DC
  Administered 2015-12-11 – 2015-12-14 (×7): 5000 [IU] via SUBCUTANEOUS
  Filled 2015-12-11 (×7): qty 1

## 2015-12-11 MED ORDER — FERROUS SULFATE 325 (65 FE) MG PO TABS
325.0000 mg | ORAL_TABLET | Freq: Every day | ORAL | Status: DC
  Administered 2015-12-13 – 2015-12-14 (×2): 325 mg via ORAL
  Filled 2015-12-11 (×3): qty 1

## 2015-12-11 MED ORDER — INSULIN GLARGINE 100 UNIT/ML ~~LOC~~ SOLN
10.0000 [IU] | Freq: Every day | SUBCUTANEOUS | Status: DC
  Administered 2015-12-11 – 2015-12-13 (×3): 10 [IU] via SUBCUTANEOUS
  Filled 2015-12-11 (×4): qty 0.1

## 2015-12-11 NOTE — ED Triage Notes (Signed)
Patient is from home.  She has vomiting & diarrhea x 1 day.  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  Patient normally talks but not speaking today.  BP:  138/74 HR: 86 R:18  CBG:206

## 2015-12-11 NOTE — H&P (Addendum)
History and Physical    CYNTHA BRICKMAN ERX:[id number] DOB: 02/10/1950 DOA: 12/11/2015  PCP: Ricke Hey, MD   Patient coming from: Home   Chief Complaint: Nausea, vomiting, Diarrhea.   HPI: BRIEANNA NAU is a 65 y.o. female with medical history significant of CHF last Ef 60 % by ECHO 2016, , DM, Emphysema, Stroke, CKD stage III cr range 2.7 to 3.1, last admission from 10-28 until 11-03 for encephalopathy and UTI, has foley catheter in place who presents with history of nausea, vomiting and watery stool that started one day ago. She report abdominal pain night prior to admission. She denies abdominal pain currently. She report fevers. She report 3 to 3 watery bowel movement per day.  ED Course: normal sodium,cr at 3.6, Hb at 10,WBC at 14,   Review of Systems: As per HPI otherwise 10 point review of systems negative.   Past Medical History:  Diagnosis Date  . Anemia   . CHF (congestive heart failure) ([place])   . Chronic back pain   . COPD (chronic obstructive pulmonary disease) ([place])   . Coronary artery disease   . Diabetes mellitus   . DJD (degenerative joint disease)   . Emphysema   . Hypercholesteremia   . Hypertension   . Myocardial infarction   . Obesity   . Obesity hypoventilation syndrome ([place])   . Renal insufficiency   . Scoliosis   . Sleep apnea   . Stroke ([place])   . TIA (transient ischemic attack)     Past Surgical History:  Procedure Laterality Date  . COLON SURGERY    . CORONARY ANGIOPLASTY WITH STENT PLACEMENT       reports that she has quit smoking. She has quit using smokeless tobacco. She reports that she does not drink alcohol or use drugs.  Allergies  Allergen Reactions  . Penicillins Hives and Swelling    Has patient had a PCN reaction causing immediate rash, facial/tongue/throat swelling, SOB or lightheadedness with hypotension: yes Has patient had a PCN reaction causing severe rash involving mucus membranes or skin necrosis: no Has patient had a  PCN reaction that required hospitalization no Has patient had a PCN reaction occurring within the last 10 years: no If all of the above answers are "NO", then may proceed with Cephalosporin use.     Family History  Problem Relation Age of Onset  . Stroke Maternal Aunt      Prior to Admission medications   Medication Sig Start Date End Date Taking? Authorizing Provider  amLODipine (NORVASC) 5 MG tablet Take 5 mg by mouth at bedtime.   Yes Historical Provider, MD  aspirin EC 325 MG tablet Take 325 mg by mouth daily. 09/09/15  Yes Historical Provider, MD  aspirin EC 81 MG EC tablet Take 1 tablet (81 mg total) by mouth daily. 11/30/15  Yes Florencia Reasons, MD  atorvastatin (LIPITOR) 80 MG tablet Take 1 tablet (80 mg total) by mouth daily at 6 PM. Patient taking differently: Take 40 mg by mouth daily at 6 PM.  07/07/15  Yes Mauricio Gerome Apley, MD  clopidogrel (PLAVIX) 75 MG tablet Take 1 tablet (75 mg total) by mouth daily. 02/07/15  Yes Daniel J Angiulli, PA-C  ferrous sulfate 325 (65 FE) MG tablet Take 1 tablet (325 mg total) by mouth daily with breakfast. 11/29/15  Yes Florencia Reasons, MD  furosemide (LASIX) 20 MG tablet Take 1 tablet (20 mg total) by mouth every Monday, Wednesday, and Friday. Patient taking differently: Take 20  mg by mouth daily. As directed 11/29/15  Yes Florencia Reasons, MD  HYDROcodone-acetaminophen (NORCO/VICODIN) 5-325 MG tablet Take 1 tablet by mouth every 4 (four) hours as needed for severe pain. 09/26/15  Yes [name], MD  Insulin Glargine (LANTUS SOLOSTAR) 100 UNIT/ML Solostar Pen Inject 10 Units into the skin at bedtime. Patient taking differently: Inject 30 Units into the skin at bedtime.  11/29/15  Yes Florencia Reasons, MD  LYRICA 50 MG capsule Take 1 capsule (50 mg total) by mouth daily. Patient taking differently: Take 50 mg by mouth 2 (two) times daily.  07/14/15  Yes Albertine Patricia, MD  metoprolol succinate (TOPROL-XL) 50 MG 24 hr tablet Take 50 mg by mouth daily. Take with or  immediately following a meal.   Yes Historical Provider, MD  Oxycodone HCl 20 MG TABS Take 20 mg by mouth 3 (three) times daily as needed for pain. 11/21/15  Yes Historical Provider, MD  pantoprazole (PROTONIX) 40 MG tablet Take 40 mg by mouth daily. 09/17/15  Yes Historical Provider, MD  polyethylene glycol (MIRALAX / GLYCOLAX) packet Take 17 g by mouth daily. Do not take if diarrhea 11/30/15  Yes Florencia Reasons, MD  senna-docusate (SENOKOT-S) 8.6-50 MG tablet Take 1 tablet by mouth at bedtime. Do not take if diarrhea 11/29/15  Yes Florencia Reasons, MD  sodium bicarbonate 650 MG tablet Take 1 tablet (650 mg total) by mouth daily. 11/29/15  Yes Florencia Reasons, MD  traMADol (ULTRAM) 50 MG tablet TAKE 1 TABLET BY MOUTH EVERY 12 HOURS AS NEEDED 12/04/15  Yes Charlett Blake, MD    Physical Exam: Vitals:   12/11/15 1553 12/11/15 1604  BP: 112/71   Pulse: 86   Resp: 17   Temp: 98 F (36.7 C)   TempSrc: Oral   SpO2: 98% 99%      Constitutional: NAD, calm, comfortable Vitals:   12/11/15 1553 12/11/15 1604  BP: 112/71   Pulse: 86   Resp: 17   Temp: 98 F (36.7 C)   TempSrc: Oral   SpO2: 98% 99%   Eyes: PERRL, lids and conjunctivae normal ENMT: Mucous membranes are moist. Posterior pharynx clear of any exudate or lesions.Normal dentition.  Neck: normal, supple, no masses, no thyromegaly Respiratory: clear to auscultation bilaterally, no wheezing, no crackles. Normal respiratory effort. No accessory muscle use.  Cardiovascular: Regular rate and rhythm, no murmurs / rubs / gallops. No extremity edema. 2+ pedal pulses. No carotid bruits.  Abdomen: no tenderness, no masses palpated. No hepatosplenomegaly. Bowel sounds positive.  Musculoskeletal: no clubbing / cyanosis. No joint deformity upper and lower extremities. Good ROM, no contractures. Normal muscle tone.  Skin: no rashes, lesions, ulcers. No induration Neurologic: CN 2-12 grossly intact. Sensation intact, DTR normal. Has chronic right upper extremity  hemiparesis, has chronic inability to moves LE>  Psychiatric: Normal judgment and insight. Alert and oriented x 3. Normal mood.    Labs on Admission: I have personally reviewed following labs and imaging studies  CBC:  Recent Labs Lab 12/11/15 1747 12/11/15 1757  WBC 14.2*  --   NEUTROABS 10.8*  --   HGB 10.6* 11.6*  HCT 32.6* 34.0*  MCV 88.1  --   PLT 387  --    Basic Metabolic Panel:  Recent Labs Lab 12/11/15 1757  NA 142  K 4.7  CL 109  GLUCOSE 180*  BUN 61*  CREATININE 3.60*   GFR: CrCl cannot be calculated (Unknown ideal weight.). Liver Function Tests:  Recent Labs Lab 12/11/15 1747  AST 16  ALT 15  ALKPHOS 103  BILITOT 0.5  PROT 7.9  ALBUMIN 3.7   No results for input(s): LIPASE, AMYLASE in the last 168 hours. No results for input(s): AMMONIA in the last 168 hours. Coagulation Profile: No results for input(s): INR, PROTIME in the last 168 hours. Cardiac Enzymes: No results for input(s): CKTOTAL, CKMB, CKMBINDEX, TROPONINI in the last 168 hours. BNP (last 3 results) No results for input(s): PROBNP in the last 8760 hours. HbA1C: No results for input(s): HGBA1C in the last 72 hours. CBG: No results for input(s): GLUCAP in the last 168 hours. Lipid Profile: No results for input(s): CHOL, HDL, LDLCALC, TRIG, CHOLHDL, LDLDIRECT in the last 72 hours. Thyroid Function Tests: No results for input(s): TSH, T4TOTAL, FREET4, T3FREE, THYROIDAB in the last 72 hours. Anemia Panel: No results for input(s): VITAMINB12, FOLATE, FERRITIN, TIBC, IRON, RETICCTPCT in the last 72 hours. Urine analysis:    Component Value Date/Time   COLORURINE YELLOW 11/23/2015 1231   APPEARANCEUR CLOUDY (A) 11/23/2015 1231   LABSPEC 1.017 11/23/2015 1231   PHURINE 5.0 11/23/2015 1231   GLUCOSEU NEGATIVE 11/23/2015 1231   HGBUR NEGATIVE 11/23/2015 1231   BILIRUBINUR NEGATIVE 11/23/2015 1231   KETONESUR NEGATIVE 11/23/2015 1231   PROTEINUR NEGATIVE 11/23/2015 1231    UROBILINOGEN 0.2 06/20/2014 0808   NITRITE NEGATIVE 11/23/2015 1231   LEUKOCYTESUR MODERATE (A) 11/23/2015 1231   Sepsis Labs: !!!!!!!!!!!!!!!!!!!!!!!!!!!!!!!!!!!!!!!!!!!! @LABRCNTIP (procalcitonin:4,lacticidven:4) )No results found for this or any previous visit (from the past 240 hour(s)).   Radiological Exams on Admission: No results found.  EKG: Sinus rythm.   Assessment/Plan Active Problems:   CHF (congestive heart failure) (HCC)   Acute kidney injury superimposed on CKD ([place])   DM type 2 with diabetic peripheral neuropathy (HCC)   History of CVA with residual deficit   Chronic obstructive pulmonary disease ([place])   AKI (acute kidney injury) ([place])  1-AKI on CKD stage II. Last Cr per records: 2.7 Related to hypovlemia, secondary to vomiting and diarrhea.  IV fluids. Hold lasix.  straict and o.   2-Diarrhea,vomitng; recent exposure to antibiotics. Patient report fever, and abdominal pain/  Will check for C diff, treat as indicated.   3-DM; will use lower dose insulin. SSI ordered.   4-Leukocytosis; could be related to Number 2. Will also check UA.   5-history of CVA; will continue with aspirin and plavix.   6-Urinary retention: had foley catheter place last admission. She is suppose to follow with urology.   7-HTN; hold norvasc,SBP soft. Continue with metoprolol to avoid rebound tachycardia.   8-Chronic diastolic HF; hold lasix.   9-Chronic pain; on vicodin PRN. Oxycodone discontinue last admission.   DVT prophylaxis: Heparin  Code Status: Full code per patient whishes.  Family Communication: none at bedside.  Disposition Plan: suspect home depending on family support.  Consults called: none Admission status: observation. Suspect 24 to 48 hours de[pending on clinical improvement.    Elmarie Shiley MD [facility] Pager [phone]  If 7PM-7AM, please contact night-coverage [email or web address] Password [facility]  12/11/2015, 7:28 PM

## 2015-12-11 NOTE — ED Notes (Signed)
Bed: WA01 Expected date: 12/11/15 Expected time: 3:42 PM Means of arrival:  Comments: EMS

## 2015-12-11 NOTE — ED Provider Notes (Signed)
[facility] DEPT Provider Note   CSN: [id number] Arrival date & time: 12/11/15  1539     History   Chief Complaint Chief Complaint  Patient presents with  . Vomiting    HPI Michelle Holmes is a 65 y.o. female.  She is reportedly here for evaluation of vomiting and diarrhea. She apparently lives with her daughter who is not with the patient upon arrival. She was recently admitted to the hospital, for urinary tract infection. Patient cannot give additional history.  Level V caveat- altered mental status  HPI  Past Medical History:  Diagnosis Date  . Anemia   . CHF (congestive heart failure) ([place])   . Chronic back pain   . COPD (chronic obstructive pulmonary disease) ([place])   . Coronary artery disease   . Diabetes mellitus   . DJD (degenerative joint disease)   . Emphysema   . Hypercholesteremia   . Hypertension   . Myocardial infarction   . Obesity   . Obesity hypoventilation syndrome ([place])   . Renal insufficiency   . Scoliosis   . Sleep apnea   . Stroke ([place])   . TIA (transient ischemic attack)     Patient Active Problem List   Diagnosis Date Noted  . Anemia   . Acute urinary retention   . Acute kidney injury superimposed on chronic kidney disease ([place])   . Pressure injury of skin 11/24/2015  . Acute metabolic encephalopathy   . Acute renal failure (ARF) ([place]) 11/23/2015  . Chronic kidney disease (CKD), stage IV (severe) ([place]) 07/06/2015  . Acute CVA (cerebrovascular accident) ([place])   . Acute encephalopathy 07/04/2015  . Sinus bradycardia 07/04/2015  . Type 2 diabetes mellitus with vascular disease ([place]) 07/04/2015  . Urinary tract infection without hematuria 07/04/2015  . Parietal lobe infarction ([place]) 07/04/2015  . Spastic hemiplegia affecting nondominant side ([place]) 07/01/2015  . Obesity, morbid ([place]) 07/01/2015  . Absolute anemia   . Thrombocytopenia ([place])   . Acute ischemic VBA thalamic stroke ([place]) 01/18/2015  . Acute kidney injury superimposed on  CKD ([place])   . Dysarthria   . Lethargy   . DM type 2 with diabetic peripheral neuropathy ([place])   . Labile blood pressure   . Hyperlipidemia   . History of CVA with residual deficit   . Chronic obstructive pulmonary disease ([place])   . Hemiparesis, aphasia, and dysphagia as late effect of cerebrovascular accident (CVA) ([place])   . Acute ischemic stroke ([place])   . CVA (cerebral vascular accident) ([place]) 01/14/2015  . CVA (cerebral infarction) 01/14/2015  . Right hemiplegia ([place])   . Right sided weakness 01/13/2015  . Abdominal pain 06/20/2014  . Altered mental status 02/08/2012  . Sleep apnea 12/08/2010  . Morbid obesity ([place]) 12/08/2010  . CHF (congestive heart failure) ([place]) 12/08/2010  . Chronic renal failure 12/08/2010  . TIA (transient ischemic attack) 12/08/2010  . CAD (coronary artery disease) 12/08/2010  . Cellulitis of pubic region 12/01/2010  . Uncontrolled type 2 DM with hyperosmolar nonketotic hyperglycemia ([place]) 12/01/2010    Past Surgical History:  Procedure Laterality Date  . COLON SURGERY    . CORONARY ANGIOPLASTY WITH STENT PLACEMENT      OB History    No data available       Home Medications    Prior to Admission medications   Medication Sig Start Date End Date Taking? Authorizing Provider  LYRICA 50 MG capsule Take 1 capsule (50 mg total) by mouth daily. Patient taking differently: Take 50  mg by mouth 2 (two) times daily.  07/14/15  Yes Silver Huguenin Elgergawy, MD  ADVAIR DISKUS 250-50 MCG/DOSE AEPB Inhale 1 puff into the lungs daily as needed (shortness of breath).  08/22/14   Historical Provider, MD  amLODipine (NORVASC) 5 MG tablet Take 5 mg by mouth at bedtime.    Historical Provider, MD  aspirin EC 81 MG EC tablet Take 1 tablet (81 mg total) by mouth daily. 11/30/15   Florencia Reasons, MD  atorvastatin (LIPITOR) 80 MG tablet Take 1 tablet (80 mg total) by mouth daily at 6 PM. 07/07/15   Mauricio Gerome Apley, MD  clopidogrel (PLAVIX) 75 MG tablet Take 1 tablet (75 mg  total) by mouth daily. 02/07/15   Lavon Paganini Angiulli, PA-C  ferrous sulfate 325 (65 FE) MG tablet Take 1 tablet (325 mg total) by mouth daily with breakfast. 11/29/15   Florencia Reasons, MD  furosemide (LASIX) 20 MG tablet Take 1 tablet (20 mg total) by mouth every Monday, Wednesday, and Friday. 11/29/15   Florencia Reasons, MD  HYDROcodone-acetaminophen (NORCO/VICODIN) 5-325 MG tablet Take 1 tablet by mouth every 4 (four) hours as needed for severe pain. 09/26/15   [name], MD  Insulin Glargine (LANTUS SOLOSTAR) 100 UNIT/ML Solostar Pen Inject 10 Units into the skin at bedtime. 11/29/15   Florencia Reasons, MD  metoprolol succinate (TOPROL-XL) 50 MG 24 hr tablet Take 50 mg by mouth daily. Take with or immediately following a meal.    Historical Provider, MD  polyethylene glycol (MIRALAX / GLYCOLAX) packet Take 17 g by mouth daily. Do not take if diarrhea 11/30/15   Florencia Reasons, MD  senna-docusate (SENOKOT-S) 8.6-50 MG tablet Take 1 tablet by mouth at bedtime. Do not take if diarrhea 11/29/15   Florencia Reasons, MD  sodium bicarbonate 650 MG tablet Take 1 tablet (650 mg total) by mouth daily. 11/29/15   Florencia Reasons, MD  traMADol (ULTRAM) 50 MG tablet TAKE 1 TABLET BY MOUTH EVERY 12 HOURS AS NEEDED 12/04/15   Charlett Blake, MD    Family History Family History  Problem Relation Age of Onset  . Stroke Maternal Aunt     Social History Social History  Substance Use Topics  . Smoking status: Former Research scientist (life sciences)  . Smokeless tobacco: Former Systems developer  . Alcohol use No     Allergies   Penicillins   Review of Systems Review of Systems  Unable to perform ROS: Mental status change     Physical Exam Updated Vital Signs BP 112/71 (BP Location: Left Arm)   Pulse 86   Temp 98 F (36.7 C) (Oral)   Resp 17   SpO2 99%   Physical Exam  Constitutional: She appears well-developed and well-nourished.  Elderly, frail  HENT:  Head: Normocephalic and atraumatic.  Dry oral mucous membranes, without other lesions.  Eyes: Conjunctivae and EOM are  normal. Pupils are equal, round, and reactive to light.  Neck: Normal range of motion and phonation normal. Neck supple.  Cardiovascular: Normal rate and regular rhythm.   Pulmonary/Chest: Effort normal and breath sounds normal. She exhibits no tenderness.  Abdominal: Soft. She exhibits no distension. There is no tenderness. There is no guarding.  Musculoskeletal: Normal range of motion.  Neurological: She is alert. No cranial nerve deficit. She exhibits normal muscle tone.  No dysarthria or aphasia. She is confused.  Skin: Skin is warm and dry.  Psychiatric: She has a normal mood and affect. Her behavior is normal.  Nursing note and vitals reviewed.  ED Treatments / Results  Labs (all labs ordered are listed, but only abnormal results are displayed) Labs Reviewed  HEPATIC FUNCTION PANEL  CBC WITH DIFFERENTIAL/PLATELET  I-STAT CHEM 8, ED    EKG  EKG Interpretation None       Radiology No results found.  Procedures Procedures (including critical care time)  Medications Ordered in ED Medications  sodium chloride 0.9 % bolus 500 mL (not administered)  ondansetron (ZOFRAN-ODT) disintegrating tablet 8 mg (not administered)     Initial Impression / Assessment and Plan / ED Course  I have reviewed the triage vital signs and the nursing notes.  Pertinent labs & imaging results that were available during my care of the patient were reviewed by me and considered in my medical decision making (see chart for details).  Clinical Course as of Dec 10 1901  Wed Dec 11, 2015  1652 Oral fluid challenge ordered  [EW]  1902 Patient was offered oral liquids, but vomited after ingesting a small amount.  [EW]    Clinical Course User Index [EW] Daleen Bo, MD    Medications  sodium chloride 0.9 % bolus 500 mL (500 mLs Intravenous New Bag/Given 12/11/15 1806)  ondansetron (ZOFRAN-ODT) disintegrating tablet 8 mg (8 mg Oral Given 12/11/15 1645)  sodium chloride 0.9 % bolus 500  mL (500 mLs Intravenous New Bag/Given 12/11/15 1859)    Patient Vitals for the past 24 hrs:  BP Temp Temp src Pulse Resp SpO2  12/11/15 1604 - - - - - 99 %  12/11/15 1553 112/71 98 F (36.7 C) Oral 86 17 98 %    7:03 PM Reevaluation with update and discussion. After initial assessment and treatment, an updated evaluation reveals no change in clinical status. She continues to deny problems, however, she remains confused.Daleen Bo L   7:06 PM-Consult complete with Hospitalist. Patient case explained and discussed. She agrees to admit patient for further evaluation and treatment. Call ended at 19:20   Final Clinical Impressions(s) / ED Diagnoses   Final diagnoses:  Nausea vomiting and diarrhea  AKI (acute kidney injury) ([place])    Reported nausea, vomiting and diarrhea, with decreased oral intake. Mild AKI, with elevated white blood cell count. Since she continues to vomit, she will be admitted for IV hydration, with symptomatic care. Doubt serious bacterial infection or impending vascular collapse.  Nursing Notes Reviewed/ Care Coordinated, and agree without changes. Applicable Imaging Reviewed.  Interpretation of Laboratory Data incorporated into ED treatment  Plan: Admit  New Prescriptions New Prescriptions   No medications on file     Daleen Bo, MD 12/11/15 1926

## 2015-12-11 NOTE — Progress Notes (Signed)
Patient was only able to take a large spoonful of her nighttime medications.  She refused to take more than one bite.  Will continue to encourage medications.  Patient was even premedicated with zofran prior to attempt.Michelle Holmes

## 2015-12-11 NOTE — ED Notes (Signed)
Patient transported to floor.

## 2015-12-11 NOTE — ED Notes (Signed)
Patient was given Ginger Ale and she tolerated it well

## 2015-12-12 DIAGNOSIS — I5032 Chronic diastolic (congestive) heart failure: Secondary | ICD-10-CM

## 2015-12-12 DIAGNOSIS — E1142 Type 2 diabetes mellitus with diabetic polyneuropathy: Secondary | ICD-10-CM

## 2015-12-12 DIAGNOSIS — J449 Chronic obstructive pulmonary disease, unspecified: Secondary | ICD-10-CM | POA: Diagnosis not present

## 2015-12-12 DIAGNOSIS — N179 Acute kidney failure, unspecified: Secondary | ICD-10-CM

## 2015-12-12 DIAGNOSIS — N189 Chronic kidney disease, unspecified: Secondary | ICD-10-CM

## 2015-12-12 DIAGNOSIS — K529 Noninfective gastroenteritis and colitis, unspecified: Secondary | ICD-10-CM

## 2015-12-12 DIAGNOSIS — I6930 Unspecified sequelae of cerebral infarction: Secondary | ICD-10-CM

## 2015-12-12 LAB — BASIC METABOLIC PANEL
Anion gap: 11 (ref 5–15)
BUN: 61 mg/dL — ABNORMAL HIGH (ref 6–20)
CO2: 23 mmol/L (ref 22–32)
Calcium: 9.2 mg/dL (ref 8.9–10.3)
Chloride: 110 mmol/L (ref 101–111)
Creatinine, Ser: 3.4 mg/dL — ABNORMAL HIGH (ref 0.44–1.00)
GFR calc Af Amer: 15 mL/min — ABNORMAL LOW (ref >60)
GFR calc non Af Amer: 13 mL/min — ABNORMAL LOW (ref >60)
Glucose, Bld: 169 mg/dL — ABNORMAL HIGH (ref 65–99)
Potassium: 4.3 mmol/L (ref 3.5–5.1)
Sodium: 144 mmol/L (ref 135–145)

## 2015-12-12 LAB — GLUCOSE, CAPILLARY: Glucose-Capillary: 183 mg/dL — ABNORMAL HIGH (ref 65–99)

## 2015-12-12 MED ORDER — INSULIN ASPART 100 UNIT/ML ~~LOC~~ SOLN
0.0000 [IU] | Freq: Three times a day (TID) | SUBCUTANEOUS | Status: DC
Start: 2015-12-13 — End: 2015-12-14
  Administered 2015-12-13 (×2): 2 [IU] via SUBCUTANEOUS
  Administered 2015-12-14 (×2): 1 [IU] via SUBCUTANEOUS

## 2015-12-12 MED ORDER — ORAL CARE MOUTH RINSE
15.0000 mL | Freq: Two times a day (BID) | OROMUCOSAL | Status: DC
  Administered 2015-12-12 – 2015-12-13 (×2): 15 mL via OROMUCOSAL

## 2015-12-12 NOTE — Care Management Obs Status (Signed)
MEDICARE OBSERVATION STATUS NOTIFICATION   Patient Details  Name: KERIANNA RAWLINSON MRN: [id number] Date of Birth: February 09, 1950   Medicare Observation Status Notification Given:  Yes    Lynnell Catalan, RN 12/12/2015, 10:40 AM

## 2015-12-12 NOTE — Progress Notes (Signed)
[facility] PROGRESS NOTE  Michelle Holmes HDQ:[id number] DOB: [birth year] DOA: 12/11/2015 PCP: Ricke Hey, MD  Interim summary and HPI 65 y.o. female with medical history significant of CHF last Ef 60 % by ECHO 2016, , DM, Emphysema, Stroke, CKD stage III cr range 2.7 to 3.1, last admission from 10-28 until 11-03 for encephalopathy and UTI, has foley catheter in place who presents with history of nausea, vomiting and watery stool that started 1-2 days prior to admission. She report abdominal pain, the night prior to admission. No longer having abdominal pain currently. She report subjective fevers and approx 3 watery bowel movement per day.  Assessment/Plan: 1-AKI on CKD stage II. Last Cr per records: 2.7 -Related to hypovlemia, secondary to GI loses and continue use of diuretics -will continue holding diuretics -continue IVF's -will follow volume status and renal function trend -Cr 3.4 currently   2-Diarrhea,vomitng; patient with recent exposure to antibiotics. Patient also reported subjective fever prior to admission. -C. Diff neg -most likely viral gastroenteritis  -will continue supportive care and hold antibiotics for now   3-DM;  -will use low dose lants and SSI -will follow CBG's and adjust hypoglycemic regimen as needed    4-Leukocytosis;  -no fever -demargination vs active intestinal infection (viral vs bacterial gastroenteritis)  -will continue holding on antibiotics currently -patient improving -will follow WBC's trend in am  5-history of CVA;  -no new deficit appreciated -will continue with aspirin and plavix.   6-Urinary retention: had foley catheter placed last admission.  -She is suppose to follow with urology as an outpatient for urodynamics, voiding trials and if needed cystoscopy.   7-HTN;  -BP is soft but stable -will continue metoprolol only for now   8-Chronic diastolic HF;  -compensated overall -will continue holding lasix.  -follow  daily weight and strict intake and output.  9-Chronic pain; continue on vicodin PRN.  Code Status: Full Code  Family Communication: no family at bedside  Disposition Plan: anticipate discharge home when medically stable; waiting for further improvement in her renal function and ability to tolerate regular diet consistency.   Consultants:  None   Procedures:  See below for x-ray reports   Antibiotics:  None   HPI/Subjective: Afebrile, feeling better. Currently denying nausea, vomiting and abd pain. Endorses improvement in appetite.  Objective: Vitals:   12/12/15 0649 12/12/15 1430  BP: 115/78 112/61  Pulse: 86 74  Resp: 16 18  Temp: 98.2 F (36.8 C) 98.1 F (36.7 C)    Intake/Output Summary (Last 24 hours) at 12/12/15 1717 Last data filed at 12/12/15 0847  Gross per 24 hour  Intake           956.25 ml  Output                0 ml  Net           956.25 ml   Filed Weights   12/11/15 2228  Weight: 84.2 kg (185 lb 10 oz)    Exam:   General:  Afebrile, feeling better, denies CP and SOB. Patient reports improvement in appetite and currently /without nausea or vomiting.  Cardiovascular: S1 and s2, no rubs or gallops   Respiratory: CTA bilaterally   Abdomen: obese, NT, positive BS, no guarding  Musculoskeletal: trace edema, no cyanosis or clubbing   Data Reviewed: Basic Metabolic Panel:  Recent Labs Lab 12/11/15 1747 12/11/15 1757 12/12/15 0407  NA  --  142 144  K  --  4.7 4.3  CL  --  109 110  CO2  --   --  23  GLUCOSE  --  180* 169*  BUN  --  61* 61*  CREATININE 3.38* 3.60* 3.40*  CALCIUM  --   --  9.2   Liver Function Tests:  Recent Labs Lab 12/11/15 1747  AST 16  ALT 15  ALKPHOS 103  BILITOT 0.5  PROT 7.9  ALBUMIN 3.7   CBC:  Recent Labs Lab 12/11/15 1747 12/11/15 1757  WBC 14.2*  --   NEUTROABS 10.8*  --   HGB 10.6* 11.6*  HCT 32.6* 34.0*  MCV 88.1  --   PLT 387  --     CBG: No results for input(s): GLUCAP in the last  168 hours.  Recent Results (from the past 240 hour(s))  C difficile quick scan w PCR reflex     Status: None   Collection Time: 12/11/15  9:40 PM  Result Value Ref Range Status   C Diff antigen NEGATIVE NEGATIVE Final   C Diff toxin NEGATIVE NEGATIVE Final   C Diff interpretation No C. difficile detected.  Final     Studies: No results found.  Scheduled Meds: . aspirin EC  81 mg Oral Daily  . atorvastatin  40 mg Oral q1800  . clopidogrel  75 mg Oral Daily  . ferrous sulfate  325 mg Oral Q breakfast  . heparin  5,000 Units Subcutaneous Q8H  . insulin glargine  10 Units Subcutaneous QHS  . mouth rinse  15 mL Mouth Rinse BID  . metoprolol succinate  50 mg Oral Daily  . pregabalin  50 mg Oral BID  . sodium bicarbonate  650 mg Oral Daily   Continuous Infusions: . sodium chloride 75 mL/hr at 12/12/15 0630    Active Problems:   CHF (congestive heart failure) (HCC)   Acute kidney injury superimposed on CKD ([place])   DM type 2 with diabetic peripheral neuropathy ([place])   History of CVA with residual deficit   Chronic obstructive pulmonary disease ([place])   AKI (acute kidney injury) ([place])    Time spent: 25 minutes    Barton Dubois  [facility] Pager [phone]. If 7PM-7AM, please contact night-coverage at [email or web address], password [facility] 12/12/2015, 5:17 PM  LOS: 0 days

## 2015-12-12 NOTE — Progress Notes (Signed)
Initial Nutrition Assessment  DOCUMENTATION CODES:   Not applicable  INTERVENTION:   Magic cup TID with meals, each supplement provides 290 kcal and 9 grams of protein  NUTRITION DIAGNOSIS:   Increased nutrient needs related to wound healing as evidenced by estimated needs.  GOAL:   Patient will meet greater than or equal to 90% of their needs  MONITOR:   PO intake, Supplement acceptance  REASON FOR ASSESSMENT:   Malnutrition Screening Tool    ASSESSMENT:   presents for V/D and AKI, h/o CHF, CKD III, COPD, Stroke  Met with patient in room today. Patient is poor historian. Patient reports improved N/V/D. Eating 100% FL diet. Stage II pressure ulcer on buttocks. Mild edema bilateral LE. RD questions current weight; significantly less from previous consistent weights. Unsure if true weight loss.   Medications reviewed and include: ferrous sulfate, heparin, lantus, lipitor  Labs reviewed: BUN 61(H), Creat 3.4(H), GFR 15(L) BG'S- 169-180, A1C-6.6-10/29    Nutrition-Focused physical exam completed. Findings  not significant for fat depletion, mild muscle depletion in dorsal hand, and mild edema bilateral lower extremitites.   Diet Order:  Diet full liquid Room service appropriate? Yes; Fluid consistency: Thin  Skin:  Reviewed, no issues  Last BM:  11/16- per RN Flowsheet  Height:   Ht Readings from Last 1 Encounters:  12/11/15 5' 7"  (1.702 m)    Weight:   Wt Readings from Last 1 Encounters:  12/11/15 185 lb 10 oz (84.2 kg)    Ideal Body Weight:  61.4 kg  BMI:  Body mass index is 29.07 kg/m.  Estimated Nutritional Needs:   Kcal:  1700-1950kcal/day (20-23kcal/kg/day)  Protein:  85-101g/day (1.0-1.2g/kg/day)  Fluid:  2L/day (70m/kcal/day)  EDUCATION NEEDS:   No education needs identified at this time  CKoleen Distance RD, LDN

## 2015-12-13 DIAGNOSIS — N390 Urinary tract infection, site not specified: Secondary | ICD-10-CM

## 2015-12-13 DIAGNOSIS — E1142 Type 2 diabetes mellitus with diabetic polyneuropathy: Secondary | ICD-10-CM | POA: Diagnosis not present

## 2015-12-13 DIAGNOSIS — N179 Acute kidney failure, unspecified: Secondary | ICD-10-CM | POA: Diagnosis not present

## 2015-12-13 DIAGNOSIS — T83511S Infection and inflammatory reaction due to indwelling urethral catheter, sequela: Secondary | ICD-10-CM

## 2015-12-13 DIAGNOSIS — N189 Chronic kidney disease, unspecified: Secondary | ICD-10-CM | POA: Diagnosis not present

## 2015-12-13 LAB — BASIC METABOLIC PANEL
Anion gap: 8 (ref 5–15)
BUN: 58 mg/dL — ABNORMAL HIGH (ref 6–20)
CO2: 21 mmol/L — ABNORMAL LOW (ref 22–32)
Calcium: 8.9 mg/dL (ref 8.9–10.3)
Chloride: 113 mmol/L — ABNORMAL HIGH (ref 101–111)
Creatinine, Ser: 3.2 mg/dL — ABNORMAL HIGH (ref 0.44–1.00)
GFR calc Af Amer: 17 mL/min — ABNORMAL LOW (ref >60)
GFR calc non Af Amer: 14 mL/min — ABNORMAL LOW (ref >60)
Glucose, Bld: 155 mg/dL — ABNORMAL HIGH (ref 65–99)
Potassium: 3.9 mmol/L (ref 3.5–5.1)
Sodium: 142 mmol/L (ref 135–145)

## 2015-12-13 LAB — CBC
HCT: 27.9 % — ABNORMAL LOW (ref 36.0–46.0)
Hemoglobin: 8.8 g/dL — ABNORMAL LOW (ref 12.0–15.0)
MCH: 28.1 pg (ref 26.0–34.0)
MCHC: 31.5 g/dL (ref 30.0–36.0)
MCV: 89.1 fL (ref 78.0–100.0)
Platelets: 294 10*3/uL (ref 150–400)
RBC: 3.13 MIL/uL — ABNORMAL LOW (ref 3.87–5.11)
RDW: 13.7 % (ref 11.5–15.5)
WBC: 10.2 10*3/uL (ref 4.0–10.5)

## 2015-12-13 LAB — GLUCOSE, CAPILLARY
Glucose-Capillary: 152 mg/dL — ABNORMAL HIGH (ref 65–99)
Glucose-Capillary: 165 mg/dL — ABNORMAL HIGH (ref 65–99)
Glucose-Capillary: 116 mg/dL — ABNORMAL HIGH (ref 65–99)
Glucose-Capillary: 126 mg/dL — ABNORMAL HIGH (ref 65–99)

## 2015-12-13 MED ORDER — DEXTROSE 5 % IV SOLN
1.0000 g | INTRAVENOUS | Status: DC
  Administered 2015-12-13 – 2015-12-14 (×2): 1 g via INTRAVENOUS
  Filled 2015-12-13 (×2): qty 10

## 2015-12-13 NOTE — Progress Notes (Addendum)
[facility] PROGRESS NOTE  Michelle Holmes YYT:[id number] DOB: 1950-04-25 DOA: 12/11/2015 PCP: Ricke Hey, MD  Interim summary and HPI 65 y.o. female with medical history significant of CHF last Ef 60 % by ECHO 2016, , DM, Emphysema, Stroke, CKD stage III cr range 2.7 to 3.1, last admission from 10-28 until 11-03 for encephalopathy and UTI, has foley catheter in place who presents with history of nausea, vomiting and watery stool that started 1-2 days prior to admission. She report abdominal pain, the night prior to admission. No longer having abdominal pain currently. She report subjective fevers and approx 3 watery bowel movement per day.  Assessment/Plan:  AKI on CKD stage II.  - Baseline creatinine 2.7, was 3.38 on admission, today is 3.2  - Related to hypovlemia, secondary to GI loses and continue use of diuretics -Improving gradually with holding diuretics and on gentle hydration ,will continue holding diuretics and cont IVF. -will follow volume status and renal function trend  [place]; patient with recent exposure to antibiotics. Patient also reported subjective fever prior to admission. -C. Diff neg -most likely viral gastroenteritis  -will continue supportive care and hold antibiotics for now   DM;  -will use low dose lants and SSI -will follow CBG's and adjust hypoglycemic regimen as needed    UTI due to foley - Patient with positive urinalysis, leukocytosis , will change her Foley catheter, will start on Rocephin and follow on urine cultures.  history of CVA;  -no new deficit appreciated -will continue with aspirin and plavix.   Urinary retention:  -had foley catheter placed last admission, will change Foley catheter secondary to UTI. -She is suppose to follow with urology as an outpatient for urodynamics, voiding trials and if needed cystoscopy.   HTN;  -BP is soft but stable -will continue metoprolol only for now   Chronic diastolic  CHF -compensated overall -will continue holding lasix.  -follow daily weight and strict intake and output.  Chronic pain; continue on vicodin PRN.  Anemia - AOCD and chronic kidney disease, baseline 7-8, glycohemoglobin was 10.6 on admission this is secondary to dehydration, currently back to baseline after appropriate hydration.  Code Status: Full Code  Family Communication: no family at bedside  Disposition Plan: Home in 1-2 days if renal function continues to improve.   Consultants:  None   Procedures:  See below for x-ray reports   Antibiotics:  None   HPI/Subjective: Afebrile, feeling better. Currently denying nausea, vomiting and abd pain. Endorses improvement in appetite.  Objective: Vitals:   12/12/15 2104 12/13/15 0502  BP: 116/70 129/77  Pulse: 71 74  Resp: 18 18  Temp: 98.4 F (36.9 C) 98.7 F (37.1 C)    Intake/Output Summary (Last 24 hours) at 12/13/15 1137 Last data filed at 12/13/15 1020  Gross per 24 hour  Intake             1930 ml  Output              325 ml  Net             1605 ml   Filed Weights   12/11/15 2228  Weight: 84.2 kg (185 lb 10 oz)    Exam:   General:  awake, follow simple commands  Cardiovascular: S1 and s2, no rubs or gallops   Respiratory: CTA bilaterally   Abdomen: obese, NT, positive BS, no guarding  Musculoskeletal: trace edema, no cyanosis or clubbing   Data Reviewed: Basic Metabolic Panel:  Recent  Labs Lab 12/11/15 1747 12/11/15 1757 12/12/15 0407 12/13/15 0436  NA  --  142 144 142  K  --  4.7 4.3 3.9  CL  --  109 110 113*  CO2  --   --  23 21*  GLUCOSE  --  180* 169* 155*  BUN  --  61* 61* 58*  CREATININE 3.38* 3.60* 3.40* 3.20*  CALCIUM  --   --  9.2 8.9   Liver Function Tests:  Recent Labs Lab 12/11/15 1747  AST 16  ALT 15  ALKPHOS 103  BILITOT 0.5  PROT 7.9  ALBUMIN 3.7   CBC:  Recent Labs Lab 12/11/15 1747 12/11/15 1757 12/13/15 0436  WBC 14.2*  --  10.2  NEUTROABS  10.8*  --   --   HGB 10.6* 11.6* 8.8*  HCT 32.6* 34.0* 27.9*  MCV 88.1  --  89.1  PLT 387  --  294    CBG:  Recent Labs Lab 12/12/15 2154 12/13/15 0757  GLUCAP 183* 152*    Recent Results (from the past 240 hour(s))  C difficile quick scan w PCR reflex     Status: None   Collection Time: 12/11/15  9:40 PM  Result Value Ref Range Status   C Diff antigen NEGATIVE NEGATIVE Final   C Diff toxin NEGATIVE NEGATIVE Final   C Diff interpretation No C. difficile detected.  Final  Urine culture     Status: Abnormal (Preliminary result)   Collection Time: 12/11/15 10:30 PM  Result Value Ref Range Status   Specimen Description URINE, RANDOM  Final   Special Requests NONE  Final   Culture 60,000 COLONIES/mL GRAM NEGATIVE RODS (A)  Final   Report Status PENDING  Incomplete     Studies: No results found.  Scheduled Meds: . aspirin EC  81 mg Oral Daily  . atorvastatin  40 mg Oral q1800  . cefTRIAXone (ROCEPHIN)  IV  1 g Intravenous Q24H  . clopidogrel  75 mg Oral Daily  . ferrous sulfate  325 mg Oral Q breakfast  . heparin  5,000 Units Subcutaneous Q8H  . insulin aspart  0-9 Units Subcutaneous TID WC  . insulin glargine  10 Units Subcutaneous QHS  . mouth rinse  15 mL Mouth Rinse BID  . metoprolol succinate  50 mg Oral Daily  . pregabalin  50 mg Oral BID  . sodium bicarbonate  650 mg Oral Daily   Continuous Infusions: . sodium chloride 75 mL/hr at 12/13/15 2130    Active Problems:   CHF (congestive heart failure) (HCC)   Acute kidney injury superimposed on CKD ([place])   DM type 2 with diabetic peripheral neuropathy (HCC)   History of CVA with residual deficit   Chronic obstructive pulmonary disease ([place])   AKI (acute kidney injury) ([place])    Michelle Holmes  [facility] Pager [phone] If 7PM-7AM, please contact night-coverage at [email or web address], password [facility] 12/13/2015, 11:37 AM  LOS: 0 days

## 2015-12-13 NOTE — Progress Notes (Signed)
This CM attempted to deliver an Advance Beneficiary Notice of Noncoverage to pt. Pt appeared confused at the time and was unable to prove she was alert and oriented. This CM called both daughters Otila Kluver and [name] and left messages for them to return my call.  ABN form left on shadow chart and staff RN aware. This CM contacted evening CM and alerted her of situation. Staff RN to contact evening CM to go over ABN with family when they arrive to visit pt. Staff RN to leave signed ABN in the pt shadow chart.  Pt is active with [facility] for RN/PT/OT and SW. Will need MD order for resumption at discharge. Per family conversation with RN, pt also has around the clock caregivers provided by family.  CM will continue to follow. Marney Doctor RN,BSN,NCM [phone]

## 2015-12-14 DIAGNOSIS — N179 Acute kidney failure, unspecified: Secondary | ICD-10-CM | POA: Diagnosis not present

## 2015-12-14 DIAGNOSIS — N189 Chronic kidney disease, unspecified: Secondary | ICD-10-CM | POA: Diagnosis not present

## 2015-12-14 DIAGNOSIS — R112 Nausea with vomiting, unspecified: Secondary | ICD-10-CM | POA: Diagnosis not present

## 2015-12-14 DIAGNOSIS — E1142 Type 2 diabetes mellitus with diabetic polyneuropathy: Secondary | ICD-10-CM | POA: Diagnosis not present

## 2015-12-14 DIAGNOSIS — T83511S Infection and inflammatory reaction due to indwelling urethral catheter, sequela: Secondary | ICD-10-CM | POA: Diagnosis not present

## 2015-12-14 DIAGNOSIS — R100 Acute abdomen: Secondary | ICD-10-CM | POA: Diagnosis not present

## 2015-12-14 LAB — CBC
HCT: 26.9 % — ABNORMAL LOW (ref 36.0–46.0)
Hemoglobin: 8.5 g/dL — ABNORMAL LOW (ref 12.0–15.0)
MCH: 28.2 pg (ref 26.0–34.0)
MCHC: 31.6 g/dL (ref 30.0–36.0)
MCV: 89.4 fL (ref 78.0–100.0)
Platelets: 243 10*3/uL (ref 150–400)
RBC: 3.01 MIL/uL — ABNORMAL LOW (ref 3.87–5.11)
RDW: 13.7 % (ref 11.5–15.5)
WBC: 10.4 10*3/uL (ref 4.0–10.5)

## 2015-12-14 LAB — BASIC METABOLIC PANEL
Anion gap: 6 (ref 5–15)
BUN: 50 mg/dL — ABNORMAL HIGH (ref 6–20)
CO2: 21 mmol/L — ABNORMAL LOW (ref 22–32)
Calcium: 8.8 mg/dL — ABNORMAL LOW (ref 8.9–10.3)
Chloride: 115 mmol/L — ABNORMAL HIGH (ref 101–111)
Creatinine, Ser: 2.73 mg/dL — ABNORMAL HIGH (ref 0.44–1.00)
GFR calc Af Amer: 20 mL/min — ABNORMAL LOW (ref >60)
GFR calc non Af Amer: 17 mL/min — ABNORMAL LOW (ref >60)
Glucose, Bld: 132 mg/dL — ABNORMAL HIGH (ref 65–99)
Potassium: 4.2 mmol/L (ref 3.5–5.1)
Sodium: 142 mmol/L (ref 135–145)

## 2015-12-14 LAB — GLUCOSE, CAPILLARY
Glucose-Capillary: 132 mg/dL — ABNORMAL HIGH (ref 65–99)
Glucose-Capillary: 146 mg/dL — ABNORMAL HIGH (ref 65–99)

## 2015-12-14 LAB — URINE CULTURE: Culture: 60000 — AB

## 2015-12-14 MED ORDER — INSULIN GLARGINE 100 UNIT/ML SOLOSTAR PEN: 10.0000 [IU] | PEN_INJECTOR | Freq: Every day | SUBCUTANEOUS | 0 refills | Status: DC

## 2015-12-14 MED ORDER — CEFPODOXIME PROXETIL 200 MG PO TABS: 200.0000 mg | ORAL_TABLET | Freq: Every day | ORAL | 0 refills | Status: AC

## 2015-12-14 MED ORDER — FUROSEMIDE 20 MG PO TABS: 20.0000 mg | ORAL_TABLET | ORAL | 5 refills | Status: DC

## 2015-12-14 NOTE — Discharge Instructions (Signed)
Follow with Primary MD Ricke Hey, MD in 7 days   Get CBC, CMP,  checked  by Primary MD next visit.    Activity: As tolerated with Full fall precautions use walker/cane & assistance as needed   Disposition Home    Diet: Heart Healthy, carb modified  , with feeding assistance and aspiration precautions.  For Heart failure patients - Check your Weight same time everyday, if you gain over 2 pounds, or you develop in leg swelling, experience more shortness of breath or chest pain, call your Primary MD immediately. Follow Cardiac Low Salt Diet and 1.5 lit/day fluid restriction.   On your next visit with your primary care physician please Get Medicines reviewed and adjusted.   Please request your Prim.MD to go over all Hospital Tests and Procedure/Radiological results at the follow up, please get all Hospital records sent to your Prim MD by signing hospital release before you go home.   If you experience worsening of your admission symptoms, develop shortness of breath, life threatening emergency, suicidal or homicidal thoughts you must seek medical attention immediately by calling 911 or calling your MD immediately  if symptoms less severe.  You Must read complete instructions/literature along with all the possible adverse reactions/side effects for all the Medicines you take and that have been prescribed to you. Take any new Medicines after you have completely understood and accpet all the possible adverse reactions/side effects.   Do not drive, operating heavy machinery, perform activities at heights, swimming or participation in water activities or provide baby sitting services if your were admitted for syncope or siezures until you have seen by Primary MD or a Neurologist and advised to do so again.  Do not drive when taking Pain medications.    Do not take more than prescribed Pain, Sleep and Anxiety Medications  Special Instructions: If you have smoked or chewed Tobacco  in  the last 2 yrs please stop smoking, stop any regular Alcohol  and or any Recreational drug use.  Wear Seat belts while driving.   Please note  You were cared for by a hospitalist during your hospital stay. If you have any questions about your discharge medications or the care you received while you were in the hospital after you are discharged, you can call the unit and asked to speak with the hospitalist on call if the hospitalist that took care of you is not available. Once you are discharged, your primary care physician will handle any further medical issues. Please note that NO REFILLS for any discharge medications will be authorized once you are discharged, as it is imperative that you return to your primary care physician (or establish a relationship with a primary care physician if you do not have one) for your aftercare needs so that they can reassess your need for medications and monitor your lab values.

## 2015-12-14 NOTE — Progress Notes (Signed)
[facility] transport arranged. Notified [facility] of scheduled dc home today with HH. Jonnie Finner RN CCM Case Mgmt phone [phone]

## 2015-12-14 NOTE — Progress Notes (Signed)
Patient discharged to home via [place] with daughter to give discharge instructions, verbalized understanding. New RX sent with patient.

## 2015-12-14 NOTE — Discharge Summary (Signed)
Michelle Holmes, is a 65 y.o. female  DOB 1950/02/25  MRN [id number].  Admission date:  12/11/2015  Admitting Physician  Elmarie Shiley, MD  Discharge Date:  12/14/2015   Primary MD  Ricke Hey, MD  Recommendations for primary care physician for things to follow:  -  Please check BMP, CBC during next visit.   Admission Diagnosis  AKI (acute kidney injury) ([place]) [N17.9] Nausea vomiting and diarrhea [R11.2, R19.7]   Discharge Diagnosis  AKI (acute kidney injury) ([place]) [N17.9] Nausea vomiting and diarrhea [R11.2, R19.7]   Active Problems:   CHF (congestive heart failure) ([place])   Acute kidney injury superimposed on CKD ([place])   DM type 2 with diabetic peripheral neuropathy (HCC)   History of CVA with residual deficit   Chronic obstructive pulmonary disease (HCC)   AKI (acute kidney injury) ([place])      Past Medical History:  Diagnosis Date  . Anemia   . CHF (congestive heart failure) ([place])   . Chronic back pain   . COPD (chronic obstructive pulmonary disease) ([place])   . Coronary artery disease   . Diabetes mellitus   . DJD (degenerative joint disease)   . Emphysema   . Hypercholesteremia   . Hypertension   . Myocardial infarction   . Obesity   . Obesity hypoventilation syndrome ([place])   . Renal insufficiency   . Scoliosis   . Sleep apnea   . Stroke ([place])   . TIA (transient ischemic attack)     Past Surgical History:  Procedure Laterality Date  . COLON SURGERY    . CORONARY ANGIOPLASTY WITH STENT PLACEMENT         History of present illness and  Hospital Course:     Kindly see H&P for history of present illness and admission details, please review complete Labs, Consult reports and Test reports for all details in brief  HPI  from the history and physical done on the day of admission 12/11/2015  HPI: Michelle Holmes is a 65 y.o. female with medical history significant of CHF  last Ef 60 % by ECHO 2016, , DM, Emphysema, Stroke, CKD stage III cr range 2.7 to 3.1, last admission from 10-28 until 11-03 for encephalopathy and UTI, has foley catheter in place who presents with history of nausea, vomiting and watery stool that started one day ago. She report abdominal pain night prior to admission. She denies abdominal pain currently. She report fevers. She report 3 to 3 watery bowel movement per day.  ED Course: normal sodium,cr at 3.6, Hb at 10,WBC at 14,    Hospital Course   AKI on CKD stage II.  - Baseline creatinine 2.7, was 3.38 on admission, rectal baseline, today is 2.7 on discharge - Related to hypovlemia, secondary to GI loses and continue use of diuretics - Improved, back to baseline with holding diuresis on gentle hydration, to go back on her diuretics on 11/22  [place]; patient with recent exposure to antibiotics. Patient also reported subjective fever prior to  admission. -C. Diff neg -most likely viral gastroenteritis  -No further diarrhea  DM; -And 2 new home dose Lantus  UTI due to foley - Patient with positive urinalysis, leukocytosis , Foley catheter change during hospital stay, treated with Rocephin, urine culture growing Escherichia coli resistant to quinolones but sensitive to cephalosporin , discharged on another 5 days on oral Vantin to finish total of 7 days treatment .  history of CVA; -no new deficit appreciated -will continue with aspirin and plavix.   Urinary retention:  -had foley catheter placed last admission, Foley catheter changed during hospital stay secondary to UTI. -She is suppose to follow with urology as an outpatient for urodynamics, voiding trials and if needed cystoscopy.   HTN; -Resume home medication on discharge  Chronic diastolic CHF -compensated overall -Euvolemic at time of discharge, resume home dose Lasix on 11/22  Chronic pain; continue on vicodin PRN.  Anemia - AOCD and chronic  kidney disease, baseline 7-8, hemoglobin was 10.6 on admission this is secondary to dehydration, currently back to baseline after appropriate hydration.    Discharge Condition:  Stable Discussed with daughter   Follow UP  Follow-up Information    Ricke Hey, MD Follow up in 1 week(s).   Specialty:  Family Medicine Contact information: [place] [place] [phone]             Discharge Instructions  and  Discharge Medications     Discharge Instructions    Discharge instructions    Complete by:  As directed    ollow with Primary MD Ricke Hey, MD in 7 days   Get CBC, CMP,  checked  by Primary MD next visit.    Activity: As tolerated with Full fall precautions use walker/cane & assistance as needed   Disposition Home    Diet: Heart Healthy, carb modified  , with feeding assistance and aspiration precautions.  For Heart failure patients - Check your Weight same time everyday, if you gain over 2 pounds, or you develop in leg swelling, experience more shortness of breath or chest pain, call your Primary MD immediately. Follow Cardiac Low Salt Diet and 1.5 lit/day fluid restriction.   On your next visit with your primary care physician please Get Medicines reviewed and adjusted.   Please request your Prim.MD to go over all Hospital Tests and Procedure/Radiological results at the follow up, please get all Hospital records sent to your Prim MD by signing hospital release before you go home.   If you experience worsening of your admission symptoms, develop shortness of breath, life threatening emergency, suicidal or homicidal thoughts you must seek medical attention immediately by calling 911 or calling your MD immediately  if symptoms less severe.  You Must read complete instructions/literature along with all the possible adverse reactions/side effects for all the Medicines you take and that have been prescribed to you. Take any  new Medicines after you have completely understood and accpet all the possible adverse reactions/side effects.   Do not drive, operating heavy machinery, perform activities at heights, swimming or participation in water activities or provide baby sitting services if your were admitted for syncope or siezures until you have seen by Primary MD or a Neurologist and advised to do so again.  Do not drive when taking Pain medications.    Do not take more than prescribed Pain, Sleep and Anxiety Medications  Special Instructions: If you have smoked or chewed Tobacco  in the last 2 yrs please stop  smoking, stop any regular Alcohol  and or any Recreational drug use.  Wear Seat belts while driving.   Please note  You were cared for by a hospitalist during your hospital stay. If you have any questions about your discharge medications or the care you received while you were in the hospital after you are discharged, you can call the unit and asked to speak with the hospitalist on call if the hospitalist that took care of you is not available. Once you are discharged, your primary care physician will handle any further medical issues. Please note that NO REFILLS for any discharge medications will be authorized once you are discharged, as it is imperative that you return to your primary care physician (or establish a relationship with a primary care physician if you do not have one) for your aftercare needs so that they can reassess your need for medications and monitor your lab values.       Medication List    TAKE these medications   amLODipine 5 MG tablet Commonly known as:  NORVASC Take 5 mg by mouth at bedtime.   aspirin 81 MG EC tablet Take 1 tablet (81 mg total) by mouth daily. What changed:  Another medication with the same name was removed. Continue taking this medication, and follow the directions you see here.   atorvastatin 80 MG tablet Commonly known as:  LIPITOR Take 1 tablet (80 mg  total) by mouth daily at 6 PM. What changed:  how much to take   cefpodoxime 200 MG tablet Commonly known as:  VANTIN Take 1 tablet (200 mg total) by mouth daily. Take 1 tablet daily, start date 12/15/2015 Start taking on:  12/15/2015   clopidogrel 75 MG tablet Commonly known as:  PLAVIX Take 1 tablet (75 mg total) by mouth daily.   ferrous sulfate 325 (65 FE) MG tablet Take 1 tablet (325 mg total) by mouth daily with breakfast.   furosemide 20 MG tablet Commonly known as:  LASIX Take 1 tablet (20 mg total) by mouth every Monday, Wednesday, and Friday. Please start first dose on Wednesday 11/22 Start taking on:  12/18/2015 What changed:  additional instructions   HYDROcodone-acetaminophen 5-325 MG tablet Commonly known as:  NORCO/VICODIN Take 1 tablet by mouth every 4 (four) hours as needed for severe pain.   Insulin Glargine 100 UNIT/ML Solostar Pen Commonly known as:  LANTUS SOLOSTAR Inject 10 Units into the skin at bedtime. What changed:  how much to take   LYRICA 50 MG capsule Generic drug:  pregabalin Take 1 capsule (50 mg total) by mouth daily. What changed:  when to take this   metoprolol succinate 50 MG 24 hr tablet Commonly known as:  TOPROL-XL Take 50 mg by mouth daily. Take with or immediately following a meal.   Oxycodone HCl 20 MG Tabs Take 20 mg by mouth 3 (three) times daily as needed for pain.   pantoprazole 40 MG tablet Commonly known as:  PROTONIX Take 40 mg by mouth daily.   polyethylene glycol packet Commonly known as:  MIRALAX / GLYCOLAX Take 17 g by mouth daily. Do not take if diarrhea   senna-docusate 8.6-50 MG tablet Commonly known as:  Senokot-S Take 1 tablet by mouth at bedtime. Do not take if diarrhea   sodium bicarbonate 650 MG tablet Take 1 tablet (650 mg total) by mouth daily.   traMADol 50 MG tablet Commonly known as:  ULTRAM TAKE 1 TABLET BY MOUTH EVERY 12 HOURS AS NEEDED  Diet and Activity recommendation: See  Discharge Instructions above   Consults obtained - none   Major procedures and Radiology Reports - PLEASE review detailed and final reports for all details, in brief -      Ct Head Wo Contrast  Result Date: 11/23/2015 CLINICAL DATA:  Altered mental status. EXAM: CT HEAD WITHOUT CONTRAST TECHNIQUE: Contiguous axial images were obtained from the base of the skull through the vertex without intravenous contrast. COMPARISON:  CT scan of September 26, 2015. 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 Mild chronic ischemic white matter disease is noted. No mass effect or midline shift is noted. Ventricular size is within normal limits. There is no evidence of mass lesion, hemorrhage or acute infarction. Vascular: Atherosclerosis of carotid siphons is noted. Skull: Bony calvarium appears intact. Sinuses/Orbits: Paranasal sinuses are unremarkable. Other: None. IMPRESSION: Mild chronic ischemic white matter disease. 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 No acute intracranial abnormality seen. Electronically Signed   By: Marijo Conception, M.D.   On: 11/23/2015 17:57   US Renal  Result Date: 11/25/2015 CLINICAL DATA:  Renal failure EXAM: RENAL / URINARY TRACT ULTRASOUND COMPLETE COMPARISON:  CT abdomen/pelvis dated 03/21/2015 FINDINGS: Right Kidney: Length: 9.2 cm.  9 mm upper pole calculus.  No hydronephrosis. Left Kidney: Length: 11.5 cm.  8 mm upper pole calculus.  No hydronephrosis. Bladder: Decompressed by indwelling Foley catheter. IMPRESSION: Bilateral renal calculi, measuring up to 9 mm.  No hydronephrosis. Bladder decompressed by indwelling Foley catheter. Electronically Signed   By: Julian Hy M.D.   On: 11/25/2015 09:17   Dg Chest Port 1 View  Result Date: 11/24/2015 CLINICAL DATA:  Acute onset of fever.  Initial encounter. EXAM: PORTABLE CHEST 1 VIEW COMPARISON:  Chest radiograph performed 07/04/2015 FINDINGS: The lungs are mildly hypoexpanded. Vascular congestion is noted. Mild  bibasilar opacities may reflect atelectasis or pneumonia, depending on the patient's symptoms. There is no evidence of pleural effusion or pneumothorax. The cardiomediastinal silhouette is borderline enlarged. No acute osseous abnormalities are seen. Chronic degenerative change is noted at the left glenohumeral joint, with superior subluxation of the left humeral head. IMPRESSION: Lungs mildly hypoexpanded. Vascular congestion and borderline cardiomegaly. Mild bibasilar opacities may reflect atelectasis or possibly pneumonia, depending on the patient's symptoms. Electronically Signed   By: Garald Balding M.D.   On: 11/24/2015 23:03    Micro Results     Recent Results (from the past 240 hour(s))  C difficile quick scan w PCR reflex     Status: None   Collection Time: 12/11/15  9:40 PM  Result Value Ref Range Status   C Diff antigen NEGATIVE NEGATIVE Final   C Diff toxin NEGATIVE NEGATIVE Final   C Diff interpretation No C. difficile detected.  Final  Urine culture     Status: Abnormal   Collection Time: 12/11/15 10:30 PM  Result Value Ref Range Status   Specimen Description URINE, RANDOM  Final   Special Requests NONE  Final   Culture 60,000 COLONIES/mL ESCHERICHIA COLI (A)  Final   Report Status 12/14/2015 FINAL  Final   Organism ID, Bacteria ESCHERICHIA COLI (A)  Final      Susceptibility   Escherichia coli - MIC*    AMPICILLIN >=32 RESISTANT Resistant     CEFAZOLIN <=4 SENSITIVE Sensitive     CEFTRIAXONE <=1 SENSITIVE Sensitive     CIPROFLOXACIN >=4 RESISTANT Resistant     GENTAMICIN <=1 SENSITIVE Sensitive     IMIPENEM <=0.25 SENSITIVE Sensitive  NITROFURANTOIN <=16 SENSITIVE Sensitive     TRIMETH/SULFA >=320 RESISTANT Resistant     AMPICILLIN/SULBACTAM 16 INTERMEDIATE Intermediate     PIP/TAZO <=4 SENSITIVE Sensitive     Extended ESBL NEGATIVE Sensitive     * 60,000 COLONIES/mL ESCHERICHIA COLI       Today   Subjective:   Gaylynn Seiple today has no headache,no chest  or abdominal pain, reports she is feeling much better today .  Objective:   Blood pressure 120/70, pulse 73, temperature 99.3 F (37.4 C), temperature source Axillary, resp. rate 16, height 5\' 7"  (1.702 m), weight 84.2 kg (185 lb 10 oz), SpO2 98 %.   Intake/Output Summary (Last 24 hours) at 12/14/15 1156 Last data filed at 12/14/15 0542  Gross per 24 hour  Intake                0 ml  Output              676 ml  Net             -676 ml    Exam Awake Alert, Oriented, Communicative, pleasant  [place].AT,PERRAL Supple Neck,No JVD, No cervical lymphadenopathy appriciated.  Symmetrical Chest wall movement, Good air movement bilaterally, CTAB RRR,No Gallops,Rubs or new Murmurs, No Parasternal Heave +ve B.Sounds, Abd Soft, Non tender, No organomegaly appriciated, No rebound -guarding or rigidity. No Cyanosis, Clubbing or edema, No new Rash or bruise  Data Review   CBC w Diff: Lab Results  Component Value Date   WBC 10.4 12/14/2015   HGB 8.5 (L) 12/14/2015   HGB 11.9 07/13/2005   HCT 26.9 (L) 12/14/2015   HCT 34.8 07/13/2005   PLT 243 12/14/2015   PLT 223 07/13/2005   LYMPHOPCT 15 12/11/2015   LYMPHOPCT 61.2 (H) 07/13/2005   BANDSPCT 0 12/12/2010   MONOPCT 8 12/11/2015   MONOPCT 5.6 07/13/2005   EOSPCT 1 12/11/2015   EOSPCT 2.5 07/13/2005   BASOPCT 0 12/11/2015   BASOPCT 0.3 07/13/2005    CMP: Lab Results  Component Value Date   NA 142 12/14/2015   K 4.2 12/14/2015   CL 115 (H) 12/14/2015   CO2 21 (L) 12/14/2015   BUN 50 (H) 12/14/2015   CREATININE 2.73 (H) 12/14/2015   PROT 7.9 12/11/2015   ALBUMIN 3.7 12/11/2015   BILITOT 0.5 12/11/2015   ALKPHOS 103 12/11/2015   AST 16 12/11/2015   ALT 15 12/11/2015  .   Total Time in preparing paper work, data evaluation and todays exam - 35 minutes  Danyel Griess M.D on 12/14/2015 at 11:56 AM  [facility]   Office  [phone]

## 2015-12-14 NOTE — Care Management Note (Signed)
Case Management Note  Patient Details  Name: Michelle Holmes MRN: [id number] Date of Birth: [birth year]  Subjective/Objective:    AKI, Nausea and Vomiting                Action/Plan: Discharge Planning: AVS reviewed: Please see previous NCM notes.   Pt lives has round the clock caregivers provided by her family. [facility] transport arranged. Notified [facility] of scheduled dc home today with HH.  PCP Ricke Hey MD  Expected Discharge Date: 12/14/2015             Expected Discharge Plan:  [place]  In-House Referral:  NA  Discharge planning Services  CM Consult  Post Acute Care Choice:  Home Health, Resumption of Svcs/PTA Provider Choice offered to:  Patient  DME Arranged:  N/A DME Agency:  NA  HH Arranged:  RN, PT, OT, Social Work [employer] Agency: [place]  Status of Service:  Completed, signed off  If discussed at [employer], dates discussed:    Additional Comments:  Erenest Rasher, RN 12/14/2015, 12:41 PM

## 2015-12-15 DIAGNOSIS — I509 Heart failure, unspecified: Secondary | ICD-10-CM | POA: Diagnosis not present

## 2015-12-16 DIAGNOSIS — Z960 Presence of urogenital implants: Secondary | ICD-10-CM | POA: Diagnosis not present

## 2015-12-16 DIAGNOSIS — D508 Other iron deficiency anemias: Secondary | ICD-10-CM | POA: Diagnosis not present

## 2015-12-16 DIAGNOSIS — N3000 Acute cystitis without hematuria: Secondary | ICD-10-CM | POA: Diagnosis not present

## 2015-12-16 DIAGNOSIS — E1122 Type 2 diabetes mellitus with diabetic chronic kidney disease: Secondary | ICD-10-CM | POA: Diagnosis not present

## 2015-12-16 DIAGNOSIS — N183 Chronic kidney disease, stage 3 (moderate): Secondary | ICD-10-CM | POA: Diagnosis not present

## 2015-12-16 DIAGNOSIS — Z794 Long term (current) use of insulin: Secondary | ICD-10-CM | POA: Diagnosis not present

## 2015-12-16 DIAGNOSIS — Z9981 Dependence on supplemental oxygen: Secondary | ICD-10-CM | POA: Diagnosis not present

## 2015-12-16 DIAGNOSIS — I129 Hypertensive chronic kidney disease with stage 1 through stage 4 chronic kidney disease, or unspecified chronic kidney disease: Secondary | ICD-10-CM | POA: Diagnosis not present

## 2015-12-16 DIAGNOSIS — R339 Retention of urine, unspecified: Secondary | ICD-10-CM | POA: Diagnosis not present

## 2015-12-17 DIAGNOSIS — Z9981 Dependence on supplemental oxygen: Secondary | ICD-10-CM | POA: Diagnosis not present

## 2015-12-17 DIAGNOSIS — N183 Chronic kidney disease, stage 3 (moderate): Secondary | ICD-10-CM | POA: Diagnosis not present

## 2015-12-17 DIAGNOSIS — N3000 Acute cystitis without hematuria: Secondary | ICD-10-CM | POA: Diagnosis not present

## 2015-12-17 DIAGNOSIS — R339 Retention of urine, unspecified: Secondary | ICD-10-CM | POA: Diagnosis not present

## 2015-12-17 DIAGNOSIS — Z794 Long term (current) use of insulin: Secondary | ICD-10-CM | POA: Diagnosis not present

## 2015-12-17 DIAGNOSIS — G4733 Obstructive sleep apnea (adult) (pediatric): Secondary | ICD-10-CM | POA: Diagnosis not present

## 2015-12-17 DIAGNOSIS — D508 Other iron deficiency anemias: Secondary | ICD-10-CM | POA: Diagnosis not present

## 2015-12-17 DIAGNOSIS — E1122 Type 2 diabetes mellitus with diabetic chronic kidney disease: Secondary | ICD-10-CM | POA: Diagnosis not present

## 2015-12-17 DIAGNOSIS — I129 Hypertensive chronic kidney disease with stage 1 through stage 4 chronic kidney disease, or unspecified chronic kidney disease: Secondary | ICD-10-CM | POA: Diagnosis not present

## 2015-12-17 DIAGNOSIS — Z960 Presence of urogenital implants: Secondary | ICD-10-CM | POA: Diagnosis not present

## 2015-12-18 DIAGNOSIS — N183 Chronic kidney disease, stage 3 (moderate): Secondary | ICD-10-CM | POA: Diagnosis not present

## 2015-12-18 DIAGNOSIS — Z9981 Dependence on supplemental oxygen: Secondary | ICD-10-CM | POA: Diagnosis not present

## 2015-12-18 DIAGNOSIS — E1122 Type 2 diabetes mellitus with diabetic chronic kidney disease: Secondary | ICD-10-CM | POA: Diagnosis not present

## 2015-12-18 DIAGNOSIS — D508 Other iron deficiency anemias: Secondary | ICD-10-CM | POA: Diagnosis not present

## 2015-12-18 DIAGNOSIS — R339 Retention of urine, unspecified: Secondary | ICD-10-CM | POA: Diagnosis not present

## 2015-12-18 DIAGNOSIS — Z960 Presence of urogenital implants: Secondary | ICD-10-CM | POA: Diagnosis not present

## 2015-12-18 DIAGNOSIS — N3000 Acute cystitis without hematuria: Secondary | ICD-10-CM | POA: Diagnosis not present

## 2015-12-18 DIAGNOSIS — Z794 Long term (current) use of insulin: Secondary | ICD-10-CM | POA: Diagnosis not present

## 2015-12-18 DIAGNOSIS — I129 Hypertensive chronic kidney disease with stage 1 through stage 4 chronic kidney disease, or unspecified chronic kidney disease: Secondary | ICD-10-CM | POA: Diagnosis not present

## 2015-12-20 DIAGNOSIS — Z794 Long term (current) use of insulin: Secondary | ICD-10-CM | POA: Diagnosis not present

## 2015-12-20 DIAGNOSIS — Z960 Presence of urogenital implants: Secondary | ICD-10-CM | POA: Diagnosis not present

## 2015-12-20 DIAGNOSIS — R339 Retention of urine, unspecified: Secondary | ICD-10-CM | POA: Diagnosis not present

## 2015-12-20 DIAGNOSIS — E1122 Type 2 diabetes mellitus with diabetic chronic kidney disease: Secondary | ICD-10-CM | POA: Diagnosis not present

## 2015-12-20 DIAGNOSIS — N3000 Acute cystitis without hematuria: Secondary | ICD-10-CM | POA: Diagnosis not present

## 2015-12-20 DIAGNOSIS — D508 Other iron deficiency anemias: Secondary | ICD-10-CM | POA: Diagnosis not present

## 2015-12-20 DIAGNOSIS — I129 Hypertensive chronic kidney disease with stage 1 through stage 4 chronic kidney disease, or unspecified chronic kidney disease: Secondary | ICD-10-CM | POA: Diagnosis not present

## 2015-12-20 DIAGNOSIS — Z9981 Dependence on supplemental oxygen: Secondary | ICD-10-CM | POA: Diagnosis not present

## 2015-12-20 DIAGNOSIS — N183 Chronic kidney disease, stage 3 (moderate): Secondary | ICD-10-CM | POA: Diagnosis not present

## 2015-12-23 ENCOUNTER — Ambulatory Visit: Payer: Medicare Other | Admitting: Physical Medicine & Rehabilitation

## 2015-12-23 DIAGNOSIS — E1122 Type 2 diabetes mellitus with diabetic chronic kidney disease: Secondary | ICD-10-CM | POA: Diagnosis not present

## 2015-12-23 DIAGNOSIS — R339 Retention of urine, unspecified: Secondary | ICD-10-CM | POA: Diagnosis not present

## 2015-12-23 DIAGNOSIS — N183 Chronic kidney disease, stage 3 (moderate): Secondary | ICD-10-CM | POA: Diagnosis not present

## 2015-12-23 DIAGNOSIS — I129 Hypertensive chronic kidney disease with stage 1 through stage 4 chronic kidney disease, or unspecified chronic kidney disease: Secondary | ICD-10-CM | POA: Diagnosis not present

## 2015-12-23 DIAGNOSIS — Z794 Long term (current) use of insulin: Secondary | ICD-10-CM | POA: Diagnosis not present

## 2015-12-23 DIAGNOSIS — Z960 Presence of urogenital implants: Secondary | ICD-10-CM | POA: Diagnosis not present

## 2015-12-23 DIAGNOSIS — N3000 Acute cystitis without hematuria: Secondary | ICD-10-CM | POA: Diagnosis not present

## 2015-12-23 DIAGNOSIS — Z9981 Dependence on supplemental oxygen: Secondary | ICD-10-CM | POA: Diagnosis not present

## 2015-12-23 DIAGNOSIS — D508 Other iron deficiency anemias: Secondary | ICD-10-CM | POA: Diagnosis not present

## 2015-12-23 DIAGNOSIS — J449 Chronic obstructive pulmonary disease, unspecified: Secondary | ICD-10-CM | POA: Diagnosis not present

## 2015-12-25 ENCOUNTER — Ambulatory Visit: Payer: Medicare Other | Admitting: Nurse Practitioner

## 2015-12-25 DIAGNOSIS — E119 Type 2 diabetes mellitus without complications: Secondary | ICD-10-CM | POA: Diagnosis not present

## 2015-12-25 DIAGNOSIS — I10 Essential (primary) hypertension: Secondary | ICD-10-CM | POA: Diagnosis not present

## 2015-12-25 DIAGNOSIS — S0003XD Contusion of scalp, subsequent encounter: Secondary | ICD-10-CM | POA: Diagnosis not present

## 2015-12-25 DIAGNOSIS — M545 Low back pain: Secondary | ICD-10-CM | POA: Diagnosis not present

## 2015-12-26 ENCOUNTER — Ambulatory Visit: Payer: Medicare Other | Admitting: Nurse Practitioner

## 2015-12-26 DIAGNOSIS — Z960 Presence of urogenital implants: Secondary | ICD-10-CM | POA: Diagnosis not present

## 2015-12-26 DIAGNOSIS — E1122 Type 2 diabetes mellitus with diabetic chronic kidney disease: Secondary | ICD-10-CM | POA: Diagnosis not present

## 2015-12-26 DIAGNOSIS — Z794 Long term (current) use of insulin: Secondary | ICD-10-CM | POA: Diagnosis not present

## 2015-12-26 DIAGNOSIS — R338 Other retention of urine: Secondary | ICD-10-CM | POA: Diagnosis not present

## 2015-12-26 DIAGNOSIS — D508 Other iron deficiency anemias: Secondary | ICD-10-CM | POA: Diagnosis not present

## 2015-12-26 DIAGNOSIS — N3000 Acute cystitis without hematuria: Secondary | ICD-10-CM | POA: Diagnosis not present

## 2015-12-26 DIAGNOSIS — N183 Chronic kidney disease, stage 3 (moderate): Secondary | ICD-10-CM | POA: Diagnosis not present

## 2015-12-26 DIAGNOSIS — R339 Retention of urine, unspecified: Secondary | ICD-10-CM | POA: Diagnosis not present

## 2015-12-26 DIAGNOSIS — Z9981 Dependence on supplemental oxygen: Secondary | ICD-10-CM | POA: Diagnosis not present

## 2015-12-26 DIAGNOSIS — I129 Hypertensive chronic kidney disease with stage 1 through stage 4 chronic kidney disease, or unspecified chronic kidney disease: Secondary | ICD-10-CM | POA: Diagnosis not present

## 2015-12-27 ENCOUNTER — Emergency Department (HOSPITAL_COMMUNITY): Payer: Medicare Other

## 2015-12-27 ENCOUNTER — Emergency Department (HOSPITAL_COMMUNITY)
Admission: EM | Admit: 2015-12-27 | Discharge: 2015-12-27 | Disposition: A | Discharge status: Home / Self Care | Payer: Medicare Other | Attending: Physician Assistant | Admitting: Physician Assistant

## 2015-12-27 DIAGNOSIS — R339 Retention of urine, unspecified: Secondary | ICD-10-CM | POA: Diagnosis not present

## 2015-12-27 DIAGNOSIS — Z955 Presence of coronary angioplasty implant and graft: Secondary | ICD-10-CM | POA: Diagnosis not present

## 2015-12-27 DIAGNOSIS — N390 Urinary tract infection, site not specified: Secondary | ICD-10-CM | POA: Diagnosis not present

## 2015-12-27 DIAGNOSIS — I252 Old myocardial infarction: Secondary | ICD-10-CM | POA: Diagnosis not present

## 2015-12-27 DIAGNOSIS — E1140 Type 2 diabetes mellitus with diabetic neuropathy, unspecified: Secondary | ICD-10-CM | POA: Diagnosis not present

## 2015-12-27 DIAGNOSIS — I2510 Atherosclerotic heart disease of native coronary artery without angina pectoris: Secondary | ICD-10-CM | POA: Diagnosis not present

## 2015-12-27 DIAGNOSIS — Z7982 Long term (current) use of aspirin: Secondary | ICD-10-CM | POA: Diagnosis not present

## 2015-12-27 DIAGNOSIS — N184 Chronic kidney disease, stage 4 (severe): Secondary | ICD-10-CM | POA: Insufficient documentation

## 2015-12-27 DIAGNOSIS — R338 Other retention of urine: Secondary | ICD-10-CM | POA: Diagnosis not present

## 2015-12-27 DIAGNOSIS — R3914 Feeling of incomplete bladder emptying: Secondary | ICD-10-CM | POA: Diagnosis not present

## 2015-12-27 DIAGNOSIS — J449 Chronic obstructive pulmonary disease, unspecified: Secondary | ICD-10-CM | POA: Diagnosis not present

## 2015-12-27 DIAGNOSIS — Z87891 Personal history of nicotine dependence: Secondary | ICD-10-CM | POA: Diagnosis not present

## 2015-12-27 DIAGNOSIS — I130 Hypertensive heart and chronic kidney disease with heart failure and stage 1 through stage 4 chronic kidney disease, or unspecified chronic kidney disease: Secondary | ICD-10-CM | POA: Insufficient documentation

## 2015-12-27 DIAGNOSIS — N3000 Acute cystitis without hematuria: Secondary | ICD-10-CM | POA: Diagnosis not present

## 2015-12-27 DIAGNOSIS — R110 Nausea: Secondary | ICD-10-CM | POA: Diagnosis not present

## 2015-12-27 DIAGNOSIS — R1110 Vomiting, unspecified: Secondary | ICD-10-CM | POA: Diagnosis not present

## 2015-12-27 DIAGNOSIS — R079 Chest pain, unspecified: Secondary | ICD-10-CM | POA: Diagnosis not present

## 2015-12-27 DIAGNOSIS — I509 Heart failure, unspecified: Secondary | ICD-10-CM | POA: Diagnosis not present

## 2015-12-27 LAB — URINALYSIS, ROUTINE W REFLEX MICROSCOPIC
Glucose, UA: NEGATIVE mg/dL
Hgb urine dipstick: NEGATIVE
Ketones, ur: NEGATIVE mg/dL
Nitrite: NEGATIVE
Protein, ur: NEGATIVE mg/dL
Specific Gravity, Urine: 1.02 (ref 1.005–1.030)
pH: 5 (ref 5.0–8.0)

## 2015-12-27 LAB — URINE MICROSCOPIC-ADD ON: RBC / HPF: NONE SEEN RBC/hpf (ref 0–5)

## 2015-12-27 LAB — COMPREHENSIVE METABOLIC PANEL
ALT: 12 U/L — ABNORMAL LOW (ref 14–54)
AST: 15 U/L (ref 15–41)
Albumin: 3.9 g/dL (ref 3.5–5.0)
Alkaline Phosphatase: 76 U/L (ref 38–126)
Anion gap: 11 (ref 5–15)
BUN: 80 mg/dL — ABNORMAL HIGH (ref 6–20)
CO2: 24 mmol/L (ref 22–32)
Calcium: 9.2 mg/dL (ref 8.9–10.3)
Chloride: 105 mmol/L (ref 101–111)
Creatinine, Ser: 4.87 mg/dL — ABNORMAL HIGH (ref 0.44–1.00)
GFR calc Af Amer: 10 mL/min — ABNORMAL LOW (ref >60)
GFR calc non Af Amer: 9 mL/min — ABNORMAL LOW (ref >60)
Glucose, Bld: 96 mg/dL (ref 65–99)
Potassium: 4.3 mmol/L (ref 3.5–5.1)
Sodium: 140 mmol/L (ref 135–145)
Total Bilirubin: 0.7 mg/dL (ref 0.3–1.2)
Total Protein: 7.4 g/dL (ref 6.5–8.1)

## 2015-12-27 LAB — CBC WITH DIFFERENTIAL/PLATELET
Basophils Absolute: 0 10*3/uL (ref 0.0–0.1)
Basophils Relative: 0 %
Eosinophils Absolute: 0.2 10*3/uL (ref 0.0–0.7)
Eosinophils Relative: 2 %
HCT: 31.9 % — ABNORMAL LOW (ref 36.0–46.0)
Hemoglobin: 10 g/dL — ABNORMAL LOW (ref 12.0–15.0)
Lymphocytes Relative: 26 %
Lymphs Abs: 2.8 10*3/uL (ref 0.7–4.0)
MCH: 28 pg (ref 26.0–34.0)
MCHC: 31.3 g/dL (ref 30.0–36.0)
MCV: 89.4 fL (ref 78.0–100.0)
Monocytes Absolute: 0.9 10*3/uL (ref 0.1–1.0)
Monocytes Relative: 8 %
Neutro Abs: 7.1 10*3/uL (ref 1.7–7.7)
Neutrophils Relative %: 64 %
Platelets: 196 10*3/uL (ref 150–400)
RBC: 3.57 MIL/uL — ABNORMAL LOW (ref 3.87–5.11)
RDW: 15.4 % (ref 11.5–15.5)
WBC: 11 10*3/uL — ABNORMAL HIGH (ref 4.0–10.5)

## 2015-12-27 LAB — I-STAT TROPONIN, ED: Troponin i, poc: 0 ng/mL (ref 0.00–0.08)

## 2015-12-27 LAB — I-STAT CG4 LACTIC ACID, ED: Lactic Acid, Venous: 0.9 mmol/L (ref 0.5–1.9)

## 2015-12-27 MED ORDER — SODIUM CHLORIDE 0.9 % IV BOLUS (SEPSIS)
1000.0000 mL | Freq: Once | INTRAVENOUS | Status: AC
  Administered 2015-12-27: 1000 mL via INTRAVENOUS

## 2015-12-27 NOTE — ED Provider Notes (Signed)
[facility] DEPT Provider Note   CSN: [id number] Arrival date & time: 12/27/15  1111     History   Chief Complaint Chief Complaint  Patient presents with  . Emesis    HPI Michelle Holmes is a 65 y.o. female.  HPI   Patient is a 66 year old female with past medical history significant for CHF, type 2 diabetes, history of CVA, COPD and recent admission for UTI with acute kidney injury. Patient had Foley placed at discharge 1 week ago. It was removed yesterday by urology. According to caregivers she was unable to void after removal of Foley. Additionally patient had admitted last week for nausea vomiting. Caregiver reports that she had one episode of vomiting yesterday.  Caregiver was not present on my evaluation and patient was unable to provide further history.  Past Medical History:  Diagnosis Date  . Anemia   . CHF (congestive heart failure) ([place])   . Chronic back pain   . COPD (chronic obstructive pulmonary disease) ([place])   . Coronary artery disease   . Diabetes mellitus   . DJD (degenerative joint disease)   . Emphysema   . Hypercholesteremia   . Hypertension   . Myocardial infarction   . Obesity   . Obesity hypoventilation syndrome ([place])   . Renal insufficiency   . Scoliosis   . Sleep apnea   . Stroke ([place])   . TIA (transient ischemic attack)     Patient Active Problem List   Diagnosis Date Noted  . AKI (acute kidney injury) ([place]) 12/11/2015  . Anemia   . Acute urinary retention   . Acute kidney injury superimposed on chronic kidney disease ([place])   . Pressure injury of skin 11/24/2015  . Acute metabolic encephalopathy   . Acute renal failure (ARF) ([place]) 11/23/2015  . Chronic kidney disease (CKD), stage IV (severe) ([place]) 07/06/2015  . Acute CVA (cerebrovascular accident) ([place])   . Acute encephalopathy 07/04/2015  . Sinus bradycardia 07/04/2015  . Type 2 diabetes mellitus with vascular disease ([place]) 07/04/2015  . Urinary tract infection without hematuria  07/04/2015  . Parietal lobe infarction ([place]) 07/04/2015  . Spastic hemiplegia affecting nondominant side ([place]) 07/01/2015  . Obesity, morbid ([place]) 07/01/2015  . Absolute anemia   . Thrombocytopenia ([place])   . Acute ischemic VBA thalamic stroke ([place]) 01/18/2015  . Acute kidney injury superimposed on CKD ([place])   . Dysarthria   . Lethargy   . DM type 2 with diabetic peripheral neuropathy ([place])   . Labile blood pressure   . Hyperlipidemia   . History of CVA with residual deficit   . Chronic obstructive pulmonary disease ([place])   . Hemiparesis, aphasia, and dysphagia as late effect of cerebrovascular accident (CVA) ([place])   . Acute ischemic stroke ([place])   . CVA (cerebral vascular accident) ([place]) 01/14/2015  . CVA (cerebral infarction) 01/14/2015  . Right hemiplegia ([place])   . Right sided weakness 01/13/2015  . Abdominal pain 06/20/2014  . Altered mental status 02/08/2012  . Sleep apnea 12/08/2010  . Morbid obesity ([place]) 12/08/2010  . CHF (congestive heart failure) ([place]) 12/08/2010  . Chronic renal failure 12/08/2010  . TIA (transient ischemic attack) 12/08/2010  . CAD (coronary artery disease) 12/08/2010  . Cellulitis of pubic region 12/01/2010  . Uncontrolled type 2 DM with hyperosmolar nonketotic hyperglycemia ([place]) 12/01/2010    Past Surgical History:  Procedure Laterality Date  . COLON SURGERY    . CORONARY ANGIOPLASTY WITH STENT PLACEMENT      OB  History    No data available       Home Medications    Prior to Admission medications   Medication Sig Start Date End Date Taking? Authorizing Provider  amLODipine (NORVASC) 5 MG tablet Take 5 mg by mouth at bedtime.    Historical Provider, MD  aspirin EC 81 MG EC tablet Take 1 tablet (81 mg total) by mouth daily. 11/30/15   Florencia Reasons, MD  atorvastatin (LIPITOR) 80 MG tablet Take 1 tablet (80 mg total) by mouth daily at 6 PM. Patient taking differently: Take 40 mg by mouth daily at 6 PM.  07/07/15   Mauricio Gerome Apley, MD    clopidogrel (PLAVIX) 75 MG tablet Take 1 tablet (75 mg total) by mouth daily. 02/07/15   Lavon Paganini Angiulli, PA-C  ferrous sulfate 325 (65 FE) MG tablet Take 1 tablet (325 mg total) by mouth daily with breakfast. 11/29/15   Florencia Reasons, MD  furosemide (LASIX) 20 MG tablet Take 1 tablet (20 mg total) by mouth every Monday, Wednesday, and Friday. Please start first dose on Wednesday 11/22 12/18/15   Albertine Patricia, MD  HYDROcodone-acetaminophen (NORCO/VICODIN) 5-325 MG tablet Take 1 tablet by mouth every 4 (four) hours as needed for severe pain. 09/26/15   [name], MD  Insulin Glargine (LANTUS SOLOSTAR) 100 UNIT/ML Solostar Pen Inject 10 Units into the skin at bedtime. 12/14/15   Silver Huguenin Elgergawy, MD  LYRICA 50 MG capsule Take 1 capsule (50 mg total) by mouth daily. Patient taking differently: Take 50 mg by mouth 2 (two) times daily.  07/14/15   Silver Huguenin Elgergawy, MD  metoprolol succinate (TOPROL-XL) 50 MG 24 hr tablet Take 50 mg by mouth daily. Take with or immediately following a meal.    Historical Provider, MD  Oxycodone HCl 20 MG TABS Take 20 mg by mouth 3 (three) times daily as needed for pain. 11/21/15   Historical Provider, MD  pantoprazole (PROTONIX) 40 MG tablet Take 40 mg by mouth daily. 09/17/15   Historical Provider, MD  polyethylene glycol (MIRALAX / GLYCOLAX) packet Take 17 g by mouth daily. Do not take if diarrhea 11/30/15   Florencia Reasons, MD  senna-docusate (SENOKOT-S) 8.6-50 MG tablet Take 1 tablet by mouth at bedtime. Do not take if diarrhea 11/29/15   Florencia Reasons, MD  sodium bicarbonate 650 MG tablet Take 1 tablet (650 mg total) by mouth daily. 11/29/15   Florencia Reasons, MD  traMADol (ULTRAM) 50 MG tablet TAKE 1 TABLET BY MOUTH EVERY 12 HOURS AS NEEDED 12/04/15   Charlett Blake, MD    Family History Family History  Problem Relation Age of Onset  . Stroke Maternal Aunt     Social History Social History  Substance Use Topics  . Smoking status: Former Research scientist (life sciences)  . Smokeless tobacco: Former  Systems developer  . Alcohol use No     Allergies   Penicillins   Review of Systems Review of Systems  Unable to perform ROS: Age  Constitutional: Negative for fatigue and fever.  Cardiovascular: Negative for chest pain.  Gastrointestinal: Positive for nausea.  Genitourinary: Negative for dysuria, flank pain, hematuria and urgency.     Physical Exam Updated Vital Signs BP 119/74 (BP Location: Right Arm)   Pulse 63   Temp 97.9 F (36.6 C) (Oral)   Resp 14   Ht 5\' 7"  (1.702 m)   Wt 185 lb (83.9 kg)   SpO2 98%   BMI 28.98 kg/m   Physical Exam  Constitutional: She  is oriented to person, place, and time. She appears well-developed and well-nourished.  HENT:  Head: Normocephalic and atraumatic.  Mildly dry lips, mucous membranes normal.  Eyes: Right eye exhibits no discharge.  Cardiovascular: Normal rate.   Pulmonary/Chest: Effort normal. No respiratory distress. She has no wheezes. She has no rales.  Abdominal: She exhibits no distension. There is no tenderness.  No abdominal pain on exam. No bladder fullness.  Neurological: She is oriented to person, place, and time.  Skin: Skin is warm and dry. She is not diaphoretic.  Psychiatric: She has a normal mood and affect.  Nursing note and vitals reviewed.    ED Treatments / Results  Labs (all labs ordered are listed, but only abnormal results are displayed) Labs Reviewed  COMPREHENSIVE METABOLIC PANEL - Abnormal; Notable for the following:       Result Value   BUN 80 (*)    Creatinine, Ser 4.87 (*)    ALT 12 (*)    GFR calc non Af Amer 9 (*)    GFR calc Af Amer 10 (*)    All other components within normal limits  CBC WITH DIFFERENTIAL/PLATELET - Abnormal; Notable for the following:    WBC 11.0 (*)    RBC 3.57 (*)    Hemoglobin 10.0 (*)    HCT 31.9 (*)    All other components within normal limits  URINALYSIS, ROUTINE W REFLEX MICROSCOPIC (NOT AT [facility]) - Abnormal; Notable for the following:    APPearance CLOUDY (*)     Bilirubin Urine SMALL (*)    Leukocytes, UA SMALL (*)    All other components within normal limits  URINE MICROSCOPIC-ADD ON - Abnormal; Notable for the following:    Squamous Epithelial / LPF 6-30 (*)    Bacteria, UA FEW (*)    All other components within normal limits  URINE CULTURE  I-STAT CG4 LACTIC ACID, ED  I-STAT TROPOININ, ED    EKG  EKG Interpretation  Date/Time:  Friday December 27 2015 12:18:09 EST Ventricular Rate:  61 PR Interval:    QRS Duration: 93 QT Interval:  422 QTC Calculation: 426 R Axis:   -2 Text Interpretation:  Sinus rhythm Inferior infarct, old no acute ischemia.  Confirmed by Gerald Leitz ([id number]) on 12/27/2015 12:22:25 PM       Radiology Dg Chest 2 View  Result Date: 12/27/2015 CLINICAL DATA:  Chest pain. EXAM: CHEST  2 VIEW COMPARISON:  11/24/2015 FINDINGS: The heart is mildly enlarged but stable. Stable tortuosity and calcification of the thoracic aorta. The lungs are clear of an acute process. No pleural effusions. Stable thoracolumbar scoliosis and severe degenerative changes involving both shoulders. IMPRESSION: No acute cardiopulmonary findings. Electronically Signed   By: Marijo Sanes M.D.   On: 12/27/2015 12:11    Procedures Procedures (including critical care time)  Medications Ordered in ED Medications  sodium chloride 0.9 % bolus 1,000 mL (1,000 mLs Intravenous New Bag/Given 12/27/15 1402)     Initial Impression / Assessment and Plan / ED Course  I have reviewed the triage vital signs and the nursing notes.  Pertinent labs & imaging results that were available during my care of the patient were reviewed by me and considered in my medical decision making (see chart for details).  Clinical Course     Patient is 65 year old female with recent hospitalization for urinary tract infection including catheter placement. Catheter removed yesterday patient has not gone since. However she does not feel uncomfortable. Bladder scan shows  200  in her bladder. Patient was brought in by caregiver. Caregiver not present at my exam. However patient appears baseline with normal vital signs and normal physical exam.  We'll give patient some fluids to make sure that she is able to void. We'll do routine blood work including creatinine to reassure that her acute kidney injury has continued to be resolved.  4:25 PM Still unable to void, bladder scan raeads > 400.  Will place patient back on foley with leg bag.  Will send urine for culture.  Patient has not had any vojmiting, nausea or diarrhea here. Normal vitals and PE.  Will discharge home with follow up again with urology.  Final Clinical Impressions(s) / ED Diagnoses   Final diagnoses:  None    New Prescriptions New Prescriptions   No medications on file     Kristle Wesch Julio Alm, MD 12/27/15 1625

## 2015-12-27 NOTE — ED Notes (Signed)
Unable to collect labs on patient at this time.

## 2015-12-27 NOTE — Discharge Instructions (Signed)
You were unable to void today. Therefore the Foley was replaced. Please follow-up with urology to try again for Foley removal.

## 2015-12-27 NOTE — ED Notes (Signed)
Bed: [facility] Expected date:  Expected time:  Means of arrival:  Comments: 

## 2015-12-27 NOTE — ED Notes (Signed)
[place] attempt IV unsuccessful. [place] at bedside attempting IV at present time.

## 2015-12-27 NOTE — ED Notes (Signed)
[place] verbalizes pt refuses IV until friend returns to "make decisions."

## 2015-12-27 NOTE — ED Triage Notes (Signed)
Pt was recently admitted in this facility for UTI with urinary catheter placement and IV ABX. Pt had catheter removed yesterday; pt family reports no voiding since catheter D/C. Pt presents today with CC of N/V/D since 12/20/15. Pt reports last episode of N/V/D yesterday morning.

## 2015-12-27 NOTE — ED Notes (Addendum)
Per provider allow bolus to infuse then attempt clean catch. If pt unable to void post bolus bladder scan and place foley.

## 2015-12-27 NOTE — ED Notes (Signed)
Pt requested bathroom assistance stating "you better hurry up I really have to pee". Pt placed on bedpan, pt unable to void. RN notified.

## 2015-12-27 NOTE — ED Notes (Signed)
Pt in DG at present time. 

## 2015-12-28 LAB — URINE CULTURE

## 2015-12-30 ENCOUNTER — Other Ambulatory Visit: Payer: Self-pay | Admitting: Physical Medicine & Rehabilitation

## 2015-12-30 DIAGNOSIS — D508 Other iron deficiency anemias: Secondary | ICD-10-CM | POA: Diagnosis not present

## 2015-12-30 DIAGNOSIS — E1122 Type 2 diabetes mellitus with diabetic chronic kidney disease: Secondary | ICD-10-CM | POA: Diagnosis not present

## 2015-12-30 DIAGNOSIS — I129 Hypertensive chronic kidney disease with stage 1 through stage 4 chronic kidney disease, or unspecified chronic kidney disease: Secondary | ICD-10-CM | POA: Diagnosis not present

## 2015-12-30 DIAGNOSIS — R339 Retention of urine, unspecified: Secondary | ICD-10-CM | POA: Diagnosis not present

## 2015-12-30 DIAGNOSIS — Z9981 Dependence on supplemental oxygen: Secondary | ICD-10-CM | POA: Diagnosis not present

## 2015-12-30 DIAGNOSIS — N3000 Acute cystitis without hematuria: Secondary | ICD-10-CM | POA: Diagnosis not present

## 2015-12-30 DIAGNOSIS — Z794 Long term (current) use of insulin: Secondary | ICD-10-CM | POA: Diagnosis not present

## 2015-12-30 DIAGNOSIS — N183 Chronic kidney disease, stage 3 (moderate): Secondary | ICD-10-CM | POA: Diagnosis not present

## 2015-12-30 DIAGNOSIS — Z960 Presence of urogenital implants: Secondary | ICD-10-CM | POA: Diagnosis not present

## 2015-12-31 DIAGNOSIS — I1310 Hypertensive heart and chronic kidney disease without heart failure, with stage 1 through stage 4 chronic kidney disease, or unspecified chronic kidney disease: Secondary | ICD-10-CM | POA: Diagnosis not present

## 2015-12-31 DIAGNOSIS — N189 Chronic kidney disease, unspecified: Secondary | ICD-10-CM | POA: Diagnosis not present

## 2015-12-31 DIAGNOSIS — E119 Type 2 diabetes mellitus without complications: Secondary | ICD-10-CM | POA: Diagnosis not present

## 2015-12-31 DIAGNOSIS — Z9981 Dependence on supplemental oxygen: Secondary | ICD-10-CM | POA: Diagnosis not present

## 2015-12-31 DIAGNOSIS — I252 Old myocardial infarction: Secondary | ICD-10-CM | POA: Diagnosis not present

## 2015-12-31 DIAGNOSIS — I2510 Atherosclerotic heart disease of native coronary artery without angina pectoris: Secondary | ICD-10-CM | POA: Diagnosis not present

## 2015-12-31 DIAGNOSIS — N3000 Acute cystitis without hematuria: Secondary | ICD-10-CM | POA: Diagnosis not present

## 2015-12-31 DIAGNOSIS — R339 Retention of urine, unspecified: Secondary | ICD-10-CM | POA: Diagnosis not present

## 2015-12-31 DIAGNOSIS — Z794 Long term (current) use of insulin: Secondary | ICD-10-CM | POA: Diagnosis not present

## 2015-12-31 DIAGNOSIS — N183 Chronic kidney disease, stage 3 (moderate): Secondary | ICD-10-CM | POA: Diagnosis not present

## 2015-12-31 DIAGNOSIS — I129 Hypertensive chronic kidney disease with stage 1 through stage 4 chronic kidney disease, or unspecified chronic kidney disease: Secondary | ICD-10-CM | POA: Diagnosis not present

## 2015-12-31 DIAGNOSIS — E1122 Type 2 diabetes mellitus with diabetic chronic kidney disease: Secondary | ICD-10-CM | POA: Diagnosis not present

## 2015-12-31 DIAGNOSIS — Z960 Presence of urogenital implants: Secondary | ICD-10-CM | POA: Diagnosis not present

## 2015-12-31 DIAGNOSIS — D508 Other iron deficiency anemias: Secondary | ICD-10-CM | POA: Diagnosis not present

## 2016-01-01 DIAGNOSIS — D508 Other iron deficiency anemias: Secondary | ICD-10-CM | POA: Diagnosis not present

## 2016-01-01 DIAGNOSIS — Z9981 Dependence on supplemental oxygen: Secondary | ICD-10-CM | POA: Diagnosis not present

## 2016-01-01 DIAGNOSIS — Z960 Presence of urogenital implants: Secondary | ICD-10-CM | POA: Diagnosis not present

## 2016-01-01 DIAGNOSIS — E1122 Type 2 diabetes mellitus with diabetic chronic kidney disease: Secondary | ICD-10-CM | POA: Diagnosis not present

## 2016-01-01 DIAGNOSIS — N183 Chronic kidney disease, stage 3 (moderate): Secondary | ICD-10-CM | POA: Diagnosis not present

## 2016-01-01 DIAGNOSIS — N3000 Acute cystitis without hematuria: Secondary | ICD-10-CM | POA: Diagnosis not present

## 2016-01-01 DIAGNOSIS — Z794 Long term (current) use of insulin: Secondary | ICD-10-CM | POA: Diagnosis not present

## 2016-01-01 DIAGNOSIS — R339 Retention of urine, unspecified: Secondary | ICD-10-CM | POA: Diagnosis not present

## 2016-01-01 DIAGNOSIS — I129 Hypertensive chronic kidney disease with stage 1 through stage 4 chronic kidney disease, or unspecified chronic kidney disease: Secondary | ICD-10-CM | POA: Diagnosis not present

## 2016-01-06 ENCOUNTER — Encounter: Payer: Self-pay | Admitting: Physician Assistant

## 2016-01-06 DIAGNOSIS — D508 Other iron deficiency anemias: Secondary | ICD-10-CM | POA: Diagnosis not present

## 2016-01-06 DIAGNOSIS — I129 Hypertensive chronic kidney disease with stage 1 through stage 4 chronic kidney disease, or unspecified chronic kidney disease: Secondary | ICD-10-CM | POA: Diagnosis not present

## 2016-01-06 DIAGNOSIS — N183 Chronic kidney disease, stage 3 (moderate): Secondary | ICD-10-CM | POA: Diagnosis not present

## 2016-01-06 DIAGNOSIS — E1122 Type 2 diabetes mellitus with diabetic chronic kidney disease: Secondary | ICD-10-CM | POA: Diagnosis not present

## 2016-01-06 DIAGNOSIS — Z9981 Dependence on supplemental oxygen: Secondary | ICD-10-CM | POA: Diagnosis not present

## 2016-01-06 DIAGNOSIS — R339 Retention of urine, unspecified: Secondary | ICD-10-CM | POA: Diagnosis not present

## 2016-01-06 DIAGNOSIS — Z794 Long term (current) use of insulin: Secondary | ICD-10-CM | POA: Diagnosis not present

## 2016-01-06 DIAGNOSIS — Z960 Presence of urogenital implants: Secondary | ICD-10-CM | POA: Diagnosis not present

## 2016-01-06 DIAGNOSIS — N3000 Acute cystitis without hematuria: Secondary | ICD-10-CM | POA: Diagnosis not present

## 2016-01-08 ENCOUNTER — Ambulatory Visit: Payer: Medicare Other | Admitting: Nurse Practitioner

## 2016-01-09 ENCOUNTER — Ambulatory Visit: Payer: Medicare Other | Admitting: Physical Medicine & Rehabilitation

## 2016-01-09 ENCOUNTER — Telehealth: Payer: Self-pay

## 2016-01-09 ENCOUNTER — Encounter: Payer: Self-pay | Admitting: Nurse Practitioner

## 2016-01-09 DIAGNOSIS — R339 Retention of urine, unspecified: Secondary | ICD-10-CM | POA: Diagnosis not present

## 2016-01-09 DIAGNOSIS — I129 Hypertensive chronic kidney disease with stage 1 through stage 4 chronic kidney disease, or unspecified chronic kidney disease: Secondary | ICD-10-CM | POA: Diagnosis not present

## 2016-01-09 DIAGNOSIS — Z9981 Dependence on supplemental oxygen: Secondary | ICD-10-CM | POA: Diagnosis not present

## 2016-01-09 DIAGNOSIS — E1122 Type 2 diabetes mellitus with diabetic chronic kidney disease: Secondary | ICD-10-CM | POA: Diagnosis not present

## 2016-01-09 DIAGNOSIS — Z960 Presence of urogenital implants: Secondary | ICD-10-CM | POA: Diagnosis not present

## 2016-01-09 DIAGNOSIS — D508 Other iron deficiency anemias: Secondary | ICD-10-CM | POA: Diagnosis not present

## 2016-01-09 DIAGNOSIS — N183 Chronic kidney disease, stage 3 (moderate): Secondary | ICD-10-CM | POA: Diagnosis not present

## 2016-01-09 DIAGNOSIS — N3000 Acute cystitis without hematuria: Secondary | ICD-10-CM | POA: Diagnosis not present

## 2016-01-09 DIAGNOSIS — Z794 Long term (current) use of insulin: Secondary | ICD-10-CM | POA: Diagnosis not present

## 2016-01-09 NOTE — Telephone Encounter (Signed)
Patient had her 3rd no show yesterday, this year. Should we dismiss per office protocol?

## 2016-01-10 DIAGNOSIS — N3000 Acute cystitis without hematuria: Secondary | ICD-10-CM | POA: Diagnosis not present

## 2016-01-10 NOTE — Telephone Encounter (Signed)
2 no shows for a new patient or 3 for a patient that has seen [place] before, yes - we will dismiss. CD

## 2016-01-14 DIAGNOSIS — I509 Heart failure, unspecified: Secondary | ICD-10-CM | POA: Diagnosis not present

## 2016-01-14 DIAGNOSIS — Z9981 Dependence on supplemental oxygen: Secondary | ICD-10-CM | POA: Diagnosis not present

## 2016-01-14 DIAGNOSIS — D508 Other iron deficiency anemias: Secondary | ICD-10-CM | POA: Diagnosis not present

## 2016-01-14 DIAGNOSIS — N3000 Acute cystitis without hematuria: Secondary | ICD-10-CM | POA: Diagnosis not present

## 2016-01-14 DIAGNOSIS — M150 Primary generalized (osteo)arthritis: Secondary | ICD-10-CM | POA: Diagnosis not present

## 2016-01-14 DIAGNOSIS — Z794 Long term (current) use of insulin: Secondary | ICD-10-CM | POA: Diagnosis not present

## 2016-01-14 DIAGNOSIS — I129 Hypertensive chronic kidney disease with stage 1 through stage 4 chronic kidney disease, or unspecified chronic kidney disease: Secondary | ICD-10-CM | POA: Diagnosis not present

## 2016-01-14 DIAGNOSIS — E1122 Type 2 diabetes mellitus with diabetic chronic kidney disease: Secondary | ICD-10-CM | POA: Diagnosis not present

## 2016-01-14 DIAGNOSIS — Z960 Presence of urogenital implants: Secondary | ICD-10-CM | POA: Diagnosis not present

## 2016-01-14 DIAGNOSIS — R339 Retention of urine, unspecified: Secondary | ICD-10-CM | POA: Diagnosis not present

## 2016-01-14 DIAGNOSIS — N183 Chronic kidney disease, stage 3 (moderate): Secondary | ICD-10-CM | POA: Diagnosis not present

## 2016-01-15 ENCOUNTER — Emergency Department (HOSPITAL_COMMUNITY)
Admission: EM | Admit: 2016-01-15 | Discharge: 2016-01-15 | Disposition: A | Discharge status: Home / Self Care | Payer: Medicare Other | Attending: Emergency Medicine | Admitting: Emergency Medicine

## 2016-01-15 ENCOUNTER — Ambulatory Visit (INDEPENDENT_AMBULATORY_CARE_PROVIDER_SITE_OTHER): Payer: Medicare Other | Admitting: Physician Assistant

## 2016-01-15 ENCOUNTER — Encounter: Payer: Self-pay | Admitting: Neurology

## 2016-01-15 ENCOUNTER — Emergency Department (HOSPITAL_COMMUNITY): Payer: Medicare Other

## 2016-01-15 ENCOUNTER — Encounter: Payer: Self-pay | Admitting: Physician Assistant

## 2016-01-15 ENCOUNTER — Encounter (HOSPITAL_COMMUNITY): Payer: Self-pay | Admitting: Emergency Medicine

## 2016-01-15 VITALS — BP 130/72 | HR 84 | Temp 98.6°F

## 2016-01-15 DIAGNOSIS — R630 Anorexia: Secondary | ICD-10-CM

## 2016-01-15 DIAGNOSIS — N184 Chronic kidney disease, stage 4 (severe): Secondary | ICD-10-CM | POA: Insufficient documentation

## 2016-01-15 DIAGNOSIS — E1140 Type 2 diabetes mellitus with diabetic neuropathy, unspecified: Secondary | ICD-10-CM | POA: Insufficient documentation

## 2016-01-15 DIAGNOSIS — I509 Heart failure, unspecified: Secondary | ICD-10-CM | POA: Diagnosis not present

## 2016-01-15 DIAGNOSIS — I2510 Atherosclerotic heart disease of native coronary artery without angina pectoris: Secondary | ICD-10-CM | POA: Insufficient documentation

## 2016-01-15 DIAGNOSIS — Z955 Presence of coronary angioplasty implant and graft: Secondary | ICD-10-CM | POA: Diagnosis not present

## 2016-01-15 DIAGNOSIS — R1030 Lower abdominal pain, unspecified: Secondary | ICD-10-CM | POA: Diagnosis not present

## 2016-01-15 DIAGNOSIS — I130 Hypertensive heart and chronic kidney disease with heart failure and stage 1 through stage 4 chronic kidney disease, or unspecified chronic kidney disease: Secondary | ICD-10-CM | POA: Diagnosis not present

## 2016-01-15 DIAGNOSIS — Z794 Long term (current) use of insulin: Secondary | ICD-10-CM | POA: Insufficient documentation

## 2016-01-15 DIAGNOSIS — I252 Old myocardial infarction: Secondary | ICD-10-CM | POA: Diagnosis not present

## 2016-01-15 DIAGNOSIS — K5900 Constipation, unspecified: Secondary | ICD-10-CM | POA: Insufficient documentation

## 2016-01-15 DIAGNOSIS — R1013 Epigastric pain: Secondary | ICD-10-CM

## 2016-01-15 DIAGNOSIS — J449 Chronic obstructive pulmonary disease, unspecified: Secondary | ICD-10-CM | POA: Diagnosis not present

## 2016-01-15 DIAGNOSIS — R109 Unspecified abdominal pain: Secondary | ICD-10-CM

## 2016-01-15 DIAGNOSIS — Z8673 Personal history of transient ischemic attack (TIA), and cerebral infarction without residual deficits: Secondary | ICD-10-CM | POA: Insufficient documentation

## 2016-01-15 DIAGNOSIS — N132 Hydronephrosis with renal and ureteral calculous obstruction: Secondary | ICD-10-CM | POA: Diagnosis not present

## 2016-01-15 DIAGNOSIS — R1032 Left lower quadrant pain: Secondary | ICD-10-CM | POA: Diagnosis not present

## 2016-01-15 DIAGNOSIS — Z7982 Long term (current) use of aspirin: Secondary | ICD-10-CM | POA: Insufficient documentation

## 2016-01-15 LAB — COMPREHENSIVE METABOLIC PANEL
ALT: 20 U/L (ref 14–54)
AST: 21 U/L (ref 15–41)
Albumin: 3.7 g/dL (ref 3.5–5.0)
Alkaline Phosphatase: 93 U/L (ref 38–126)
Anion gap: 12 (ref 5–15)
BUN: 88 mg/dL — ABNORMAL HIGH (ref 6–20)
CO2: 22 mmol/L (ref 22–32)
Calcium: 9.4 mg/dL (ref 8.9–10.3)
Chloride: 106 mmol/L (ref 101–111)
Creatinine, Ser: 3.91 mg/dL — ABNORMAL HIGH (ref 0.44–1.00)
GFR calc Af Amer: 13 mL/min — ABNORMAL LOW (ref >60)
GFR calc non Af Amer: 11 mL/min — ABNORMAL LOW (ref >60)
Glucose, Bld: 233 mg/dL — ABNORMAL HIGH (ref 65–99)
Potassium: 4.9 mmol/L (ref 3.5–5.1)
Sodium: 140 mmol/L (ref 135–145)
Total Bilirubin: 0.7 mg/dL (ref 0.3–1.2)
Total Protein: 7.8 g/dL (ref 6.5–8.1)

## 2016-01-15 LAB — URINALYSIS, ROUTINE W REFLEX MICROSCOPIC
Bilirubin Urine: NEGATIVE
Glucose, UA: NEGATIVE mg/dL
Ketones, ur: NEGATIVE mg/dL
Nitrite: NEGATIVE
Protein, ur: 100 mg/dL — AB
Specific Gravity, Urine: 1.015 (ref 1.005–1.030)
pH: 5 (ref 5.0–8.0)

## 2016-01-15 LAB — CBC
HCT: 31.2 % — ABNORMAL LOW (ref 36.0–46.0)
Hemoglobin: 10.1 g/dL — ABNORMAL LOW (ref 12.0–15.0)
MCH: 28.5 pg (ref 26.0–34.0)
MCHC: 32.4 g/dL (ref 30.0–36.0)
MCV: 88.1 fL (ref 78.0–100.0)
Platelets: 196 10*3/uL (ref 150–400)
RBC: 3.54 MIL/uL — ABNORMAL LOW (ref 3.87–5.11)
RDW: 15.8 % — ABNORMAL HIGH (ref 11.5–15.5)
WBC: 9.3 10*3/uL (ref 4.0–10.5)

## 2016-01-15 LAB — CBG MONITORING, ED: Glucose-Capillary: 141 mg/dL — ABNORMAL HIGH (ref 65–99)

## 2016-01-15 LAB — LIPASE, BLOOD: Lipase: 54 U/L — ABNORMAL HIGH (ref 11–51)

## 2016-01-15 MED ORDER — IOPAMIDOL (ISOVUE-300) INJECTION 61%
30.0000 mL | Freq: Once | INTRAVENOUS | Status: AC | PRN
  Administered 2016-01-15: 30 mL via ORAL

## 2016-01-15 MED ORDER — IOPAMIDOL (ISOVUE-300) INJECTION 61%
INTRAVENOUS | Status: AC
  Administered 2016-01-15: 30 mL via ORAL
  Filled 2016-01-15: qty 30

## 2016-01-15 MED ORDER — POLYETHYLENE GLYCOL 3350 17 GM/SCOOP PO POWD: 17.0000 g | Freq: Every day | ORAL | 0 refills | Status: DC

## 2016-01-15 NOTE — ED Notes (Signed)
RN at bedside will start IV and collect labs

## 2016-01-15 NOTE — ED Provider Notes (Signed)
[facility] DEPT Provider Note   CSN: [id number] Arrival date & time: 01/15/16  1034     History   Chief Complaint Chief Complaint  Patient presents with  . Abdominal Pain    HPI Michelle Holmes is a 65 y.o. female.  HPI Patient reports increasing left lower quadrant abdominal pain over the past several days.  Patient reports no nausea or vomiting but family reported nausea and vomiting today as well.  She denies fevers.  She has a chronic indwelling Foley catheter.  She feels like she's had decreased bowel movements over the past several days.  No weight loss.  Family reports mild decreased oral intake over the past month.  No rectal bleeding.  No fevers or chills.  Denies productive cough and chest pain.  Symptoms are mild to moderate in severity and worse with palpation of her left groin and left lower abdomen   Past Medical History:  Diagnosis Date  . Anemia   . CHF (congestive heart failure) ([place])   . Chronic back pain   . COPD (chronic obstructive pulmonary disease) ([place])   . Coronary artery disease   . Diabetes mellitus   . DJD (degenerative joint disease)   . Emphysema   . Hypercholesteremia   . Hypertension   . Myocardial infarction   . Obesity   . Obesity hypoventilation syndrome ([place])   . Renal insufficiency   . Scoliosis   . Sleep apnea   . Stroke ([place])   . TIA (transient ischemic attack)     Patient Active Problem List   Diagnosis Date Noted  . AKI (acute kidney injury) ([place]) 12/11/2015  . Anemia   . Acute urinary retention   . Acute kidney injury superimposed on chronic kidney disease ([name])   . Pressure injury of skin 11/24/2015  . Acute metabolic encephalopathy   . Acute renal failure (ARF) ([place]) 11/23/2015  . Chronic kidney disease (CKD), stage IV (severe) ([place]) 07/06/2015  . Acute CVA (cerebrovascular accident) ([place])   . Acute encephalopathy 07/04/2015  . Sinus bradycardia 07/04/2015  . Type 2 diabetes mellitus with vascular disease ([place])  07/04/2015  . Urinary tract infection without hematuria 07/04/2015  . Parietal lobe infarction ([place]) 07/04/2015  . Spastic hemiplegia affecting nondominant side ([place]) 07/01/2015  . Obesity, morbid ([place]) 07/01/2015  . Absolute anemia   . Thrombocytopenia ([place])   . Acute ischemic VBA thalamic stroke ([place]) 01/18/2015  . Acute kidney injury superimposed on CKD ([place])   . Dysarthria   . Lethargy   . DM type 2 with diabetic peripheral neuropathy ([place])   . Labile blood pressure   . Hyperlipidemia   . History of CVA with residual deficit   . Chronic obstructive pulmonary disease ([place])   . Hemiparesis, aphasia, and dysphagia as late effect of cerebrovascular accident (CVA) ([place])   . Acute ischemic stroke ([place])   . CVA (cerebral vascular accident) ([place]) 01/14/2015  . CVA (cerebral infarction) 01/14/2015  . Right hemiplegia ([place])   . Right sided weakness 01/13/2015  . Abdominal pain 06/20/2014  . Altered mental status 02/08/2012  . Sleep apnea 12/08/2010  . Morbid obesity ([place]) 12/08/2010  . CHF (congestive heart failure) ([place]) 12/08/2010  . Chronic renal failure 12/08/2010  . TIA (transient ischemic attack) 12/08/2010  . CAD (coronary artery disease) 12/08/2010  . Cellulitis of pubic region 12/01/2010  . Uncontrolled type 2 DM with hyperosmolar nonketotic hyperglycemia ([place]) 12/01/2010    Past Surgical History:  Procedure Laterality Date  . COLON  SURGERY    . CORONARY ANGIOPLASTY WITH STENT PLACEMENT      OB History    No data available       Home Medications    Prior to Admission medications   Medication Sig Start Date End Date Taking? Authorizing Provider  amLODipine (NORVASC) 5 MG tablet Take 5 mg by mouth at bedtime.    Historical Provider, MD  aspirin EC 81 MG EC tablet Take 1 tablet (81 mg total) by mouth daily. 11/30/15   Florencia Reasons, MD  atorvastatin (LIPITOR) 80 MG tablet Take 1 tablet (80 mg total) by mouth daily at 6 PM. Patient taking differently: Take 40 mg by mouth  daily at 6 PM.  07/07/15   Mauricio Gerome Apley, MD  clopidogrel (PLAVIX) 75 MG tablet Take 1 tablet (75 mg total) by mouth daily. 02/07/15   Lavon Paganini Angiulli, PA-C  ferrous sulfate 325 (65 FE) MG tablet Take 1 tablet (325 mg total) by mouth daily with breakfast. 11/29/15   Florencia Reasons, MD  furosemide (LASIX) 20 MG tablet Take 1 tablet (20 mg total) by mouth every Monday, Wednesday, and Friday. Please start first dose on Wednesday 11/22 12/18/15   Albertine Patricia, MD  HYDROcodone-acetaminophen (NORCO/VICODIN) 5-325 MG tablet Take 1 tablet by mouth every 4 (four) hours as needed for severe pain. 09/26/15   [name], MD  Insulin Glargine (LANTUS SOLOSTAR) 100 UNIT/ML Solostar Pen Inject 10 Units into the skin at bedtime. 12/14/15   Silver Huguenin Elgergawy, MD  LYRICA 50 MG capsule Take 1 capsule (50 mg total) by mouth daily. Patient taking differently: Take 50 mg by mouth 2 (two) times daily.  07/14/15   Silver Huguenin Elgergawy, MD  metoprolol succinate (TOPROL-XL) 50 MG 24 hr tablet Take 50 mg by mouth daily. Take with or immediately following a meal.    Historical Provider, MD  Oxycodone HCl 20 MG TABS Take 20 mg by mouth 3 (three) times daily as needed for pain. 11/21/15   Historical Provider, MD  pantoprazole (PROTONIX) 40 MG tablet Take 40 mg by mouth daily. 09/17/15   Historical Provider, MD  polyethylene glycol powder (MIRALAX) powder Take 17 g by mouth daily. 01/15/16   Jola Schmidt, MD  senna-docusate (SENOKOT-S) 8.6-50 MG tablet Take 1 tablet by mouth at bedtime. Do not take if diarrhea 11/29/15   Florencia Reasons, MD  sodium bicarbonate 650 MG tablet Take 1 tablet (650 mg total) by mouth daily. 11/29/15   Florencia Reasons, MD  traMADol Veatrice Bourbon) 50 MG tablet take 1 TABLET BY MOUTH EVERY 12 hours 12/30/15   Charlett Blake, MD    Family History Family History  Problem Relation Age of Onset  . Diabetes Mother   . Stroke Maternal Aunt     Social History Social History  Substance Use Topics  . Smoking status:  Never Smoker  . Smokeless tobacco: Never Used  . Alcohol use No     Allergies   Penicillins   Review of Systems Review of Systems  All other systems reviewed and are negative.    Physical Exam Updated Vital Signs BP 117/74 (BP Location: Left Arm)   Pulse 92   Temp 98.2 F (36.8 C) (Oral)   Resp 18   Ht 5\' 5"  (1.651 m)   Wt 185 lb (83.9 kg)   SpO2 100%   BMI 30.79 kg/m   Physical Exam  Constitutional: She is oriented to person, place, and time. She appears well-developed and well-nourished.  HENT:  Head: Normocephalic.  Eyes: EOM are normal.  Neck: Normal range of motion.  Cardiovascular: Normal rate.   Pulmonary/Chest: Effort normal and breath sounds normal.  Abdominal: She exhibits no distension.  Mild left lower quadrant abdominal pain.  No left inguinal adenopathy noted.  No obvious inguinal hernias palpable  Musculoskeletal: Normal range of motion.  Neurological: She is alert and oriented to person, place, and time.  Psychiatric: She has a normal mood and affect.  Nursing note and vitals reviewed.    ED Treatments / Results  Labs (all labs ordered are listed, but only abnormal results are displayed) Labs Reviewed  LIPASE, BLOOD - Abnormal; Notable for the following:       Result Value   Lipase 54 (*)    All other components within normal limits  COMPREHENSIVE METABOLIC PANEL - Abnormal; Notable for the following:    Glucose, Bld 233 (*)    BUN 88 (*)    Creatinine, Ser 3.91 (*)    GFR calc non Af Amer 11 (*)    GFR calc Af Amer 13 (*)    All other components within normal limits  CBC - Abnormal; Notable for the following:    RBC 3.54 (*)    Hemoglobin 10.1 (*)    HCT 31.2 (*)    RDW 15.8 (*)    All other components within normal limits  URINALYSIS, ROUTINE W REFLEX MICROSCOPIC - Abnormal; Notable for the following:    APPearance TURBID (*)    Hgb urine dipstick MODERATE (*)    Protein, ur 100 (*)    Leukocytes, UA LARGE (*)    Bacteria, UA  MANY (*)    Squamous Epithelial / LPF 6-30 (*)    All other components within normal limits   BUN  Date Value Ref Range Status  01/15/2016 88 (H) 6 - 20 mg/dL Final  12/27/2015 80 (H) 6 - 20 mg/dL Final  12/14/2015 50 (H) 6 - 20 mg/dL Final  12/13/2015 58 (H) 6 - 20 mg/dL Final   Creatinine, Ser  Date Value Ref Range Status  01/15/2016 3.91 (H) 0.44 - 1.00 mg/dL Final  12/27/2015 4.87 (H) 0.44 - 1.00 mg/dL Final  12/14/2015 2.73 (H) 0.44 - 1.00 mg/dL Final  12/13/2015 3.20 (H) 0.44 - 1.00 mg/dL Final      EKG  EKG Interpretation None       Radiology Ct Abdomen Pelvis Wo Contrast  Result Date: 01/15/2016 CLINICAL DATA:  Abdominal pain, nausea and vomiting starting this morning EXAM: CT ABDOMEN AND PELVIS WITHOUT CONTRAST TECHNIQUE: Multidetector CT imaging of the abdomen and pelvis was performed following the standard protocol without IV contrast. COMPARISON:  2/23/ 17 FINDINGS: Lower chest: Lung bases are normal. Hepatobiliary: Unenhanced liver shows no biliary ductal dilatation. The patient is status post cholecystectomy. There are artifacts from patient's forearms and hands overlying the upper abdomen. Pancreas: Unenhanced pancreas with normal appearance. Spleen: Unenhanced spleen is normal. Adrenals/Urinary Tract: No left adrenal gland mass. Stable probable adenoma right adrenal gland measures 1.4 cm. Again noted bilateral multiple calcified renal calculi. At least 3 right renal calculi the largest in midpole measures 4 mm. Calcified calculus in midpole of the left kidney measures 5 mm. Axial image 35 there is 2.6 mm calcified calculus in proximal right ureter at the level of upper endplate of L3 vertebral body. Minimal right hydronephrosis. No left ureteral calculi. At least 1 or 2 calcified calculi are noted within right posterior aspect of the urinary bladder the largest measures  2.7 mm. There is a Foley catheter within decompressed urinary bladder. Stomach/Bowel: No small bowel  obstruction. O postsurgical changes are noted in right colon. No evidence of colonic obstruction. Moderate gas and stool noted within transverse colon. Moderate gas noted in proximal sigmoid colon. Abundant stool noted within rectum. The rectum measures 8.5 cm in diameter suspicious for mild fecal impaction. Vascular/Lymphatic: Mild atherosclerotic calcifications of abdominal aorta and iliac arteries. No aortic aneurysm. Reproductive: The patient is status post hysterectomy. Other: No ascites or free abdominal air. Musculoskeletal: There is significant dextroscoliosis lower thoracic and lumbar spine. Extensive degenerative changes are noted thoracolumbar spine. IMPRESSION: 1. There is minimal right hydronephrosis and proximal right hydroureter. Bilateral nonobstructive nephrolithiasis. 2. There is 2.6 mm calcified calculus in proximal right ureter at the level of upper endplate of L3 vertebral body. 3. Small calcified calculi are noted right posterior aspect of empty urinary bladder the largest measures 2.7 mm. There is a Foley catheter within decompressed urinary bladder. 4. No small bowel or colonic obstruction. Postsurgical changes are noted right colon no evidence of anastomotic stricture. 5. Abundant stool noted within rectum. The rectum measures 8.5 cm in diameter suspicious for fecal impaction. 6. Degenerative changes and significant dextroscoliosis lower thoracic and lumbar spine. Electronically Signed   By: Lahoma Crocker M.D.   On: 01/15/2016 13:01    Procedures Procedures (including critical care time)  Medications Ordered in ED Medications  iopamidol (ISOVUE-300) 61 % injection 30 mL (30 mLs Oral Contrast Given 01/15/16 1155)     Initial Impression / Assessment and Plan / ED Course  I have reviewed the triage vital signs and the nursing notes.  Pertinent labs & imaging results that were available during my care of the patient were reviewed by me and considered in my medical decision making  (see chart for details).  Clinical Course     Baseline renal insufficiency.  Patient feels like she's having bowel movements but on CT scan she looks as though she might be constipated.  She be started on MiraLAX.  No obvious intra-abdominal pathology noted as the cause of her left lower quadrant pain.  No active diverticulitis.  Patient understands return the ER for new or worsening symptoms.  No vomiting while in the emergency department.  Tolerating oral fluids.  Discharge home in good condition.  Final Clinical Impressions(s) / ED Diagnoses   Final diagnoses:  Abdominal pain, unspecified abdominal location  Constipation, unspecified constipation type    New Prescriptions New Prescriptions   POLYETHYLENE GLYCOL POWDER (MIRALAX) POWDER    Take 17 g by mouth daily.     Jola Schmidt, MD 01/15/16 [phone]

## 2016-01-15 NOTE — ED Triage Notes (Signed)
Patient's family member reports patient has had decreased PO intake for about a month so she was being seen by a GI doctor. Patient began to have abdominal pain, nausea, and vomiting starting this morning. Patient has a foley catheter in place.

## 2016-01-15 NOTE — Progress Notes (Signed)
Chief Complaint: Abdominal pain, nausea, vomiting, decreased appetite  HPI:  Michelle Holmes is a 65 year old African-American female with a past medical history of CHF, COPD, CAD, diabetes, obesity, renal insufficiency, sleep apnea in urinary retention, who was referred to me by Ricke Hey, MD for a complaint of abdominal pain, nausea, vomiting and decreased appetite.   Per chart review patient has been seen in the ER recently on 12/27/2015 for similar symptoms, at that time she was found to have urinary retention even after fluids and in and out cath, apparently she was given a permanent cath at that time per the patient's friend who is with her. They have followed with urology since then proximally 2 weeks ago and she was told to keep the cath in. She does have urinary output with the catheter.   At time of exam today the patient is in tears and crying out in agony and pain crying "O God O God!". She is clutching the left lower side of her abdomen while sitting in her wheelchair. Her family friend is by her side holding a trash can which she continues to lean over. The patient is unable to answer a lot of my questions due to her pain, her family friend does tell me that she has had trouble with urinary retention and apparently they're seeing a urologist as above. The patient is able to tell me that most of her pain is in the left lower quadrant and she clutches her side. She feels nauseous and has been vomiting all the way to the clinic. Apparently this pain just started at this severity this morning. It is a 10/10 pain sharp in nature. The patient denies having had bowel movements regularly over the past few weeks. Her family friend tells me that they are concerned because she is not eating. Apparently this has all been going on for over a month. Patient is unable to answer a lot of my other questions due to her pain, tears and nausea.   Patient denies fever, chills, blood in her stool or  melena.  Past Medical History:  Diagnosis Date  . Anemia   . CHF (congestive heart failure) ([place])   . Chronic back pain   . COPD (chronic obstructive pulmonary disease) ([place])   . Coronary artery disease   . Diabetes mellitus   . DJD (degenerative joint disease)   . Emphysema   . Hypercholesteremia   . Hypertension   . Myocardial infarction   . Obesity   . Obesity hypoventilation syndrome ([place])   . Renal insufficiency   . Scoliosis   . Sleep apnea   . Stroke ([place])   . TIA (transient ischemic attack)     Past Surgical History:  Procedure Laterality Date  . COLON SURGERY    . CORONARY ANGIOPLASTY WITH STENT PLACEMENT      Current Outpatient Prescriptions  Medication Sig Dispense Refill  . aspirin EC 81 MG EC tablet Take 1 tablet (81 mg total) by mouth daily. 30 tablet 0  . atorvastatin (LIPITOR) 80 MG tablet Take 1 tablet (80 mg total) by mouth daily at 6 PM. (Patient taking differently: Take 40 mg by mouth daily at 6 PM. ) 30 tablet 0  . clopidogrel (PLAVIX) 75 MG tablet Take 1 tablet (75 mg total) by mouth daily. 30 tablet 3  . ferrous sulfate 325 (65 FE) MG tablet Take 1 tablet (325 mg total) by mouth daily with breakfast. 60 tablet 3  . furosemide (LASIX) 20 MG  tablet Take 1 tablet (20 mg total) by mouth every Monday, Wednesday, and Friday. Please start first dose on Wednesday 11/22 30 tablet 5  . HYDROcodone-acetaminophen (NORCO/VICODIN) 5-325 MG tablet Take 1 tablet by mouth every 4 (four) hours as needed for severe pain. 10 tablet 0  . Insulin Glargine (LANTUS SOLOSTAR) 100 UNIT/ML Solostar Pen Inject 10 Units into the skin at bedtime. 15 mL 0  . LYRICA 50 MG capsule Take 1 capsule (50 mg total) by mouth daily. (Patient taking differently: Take 50 mg by mouth 2 (two) times daily. ) 60 capsule 3  . metoprolol succinate (TOPROL-XL) 50 MG 24 hr tablet Take 50 mg by mouth daily. Take with or immediately following a meal.    . Oxycodone HCl 20 MG TABS Take 20 mg by mouth 3  (three) times daily as needed for pain.  0  . pantoprazole (PROTONIX) 40 MG tablet Take 40 mg by mouth daily.  2  . polyethylene glycol (MIRALAX / GLYCOLAX) packet Take 17 g by mouth daily. Do not take if diarrhea 14 each 0  . senna-docusate (SENOKOT-S) 8.6-50 MG tablet Take 1 tablet by mouth at bedtime. Do not take if diarrhea 30 tablet 0  . sodium bicarbonate 650 MG tablet Take 1 tablet (650 mg total) by mouth daily. 30 tablet 0  . traMADol (ULTRAM) 50 MG tablet take 1 TABLET BY MOUTH EVERY 12 hours 60 tablet 1  . amLODipine (NORVASC) 5 MG tablet Take 5 mg by mouth at bedtime.     No current facility-administered medications for this visit.     Allergies as of 01/15/2016 - Review Complete 01/15/2016  Allergen Reaction Noted  . Penicillins Hives and Swelling 12/01/2010    Family History  Problem Relation Age of Onset  . Diabetes Mother   . Stroke Maternal Aunt     Social History   Social History  . Marital status: Divorced    Spouse name: N/A  . Number of children: 5  . Years of education: N/A   Occupational History  . Not on file.   Social History Main Topics  . Smoking status: Never Smoker  . Smokeless tobacco: Never Used  . Alcohol use No  . Drug use: No  . Sexual activity: Not on file   Other Topics Concern  . Not on file   Social History Narrative  . No narrative on file    Review of Systems:     Constitutional: Positive for fatigue and weakness No weight loss HEENT: Eyes: No change in vision               Ears, Nose, Throat:  No change in hearing  Skin: No rash  Cardiovascular: No chest pain Respiratory: No SOB  Gastrointestinal: See HPI and otherwise negative Genitourinary: No dysuria or change in urinary frequency Neurological: No headache, dizziness or syncope Musculoskeletal: No new muscle or joint pain Hematologic: No bleeding or bruising Psychiatric: Positive for depression    Physical Exam:  Vital signs: BP 130/72   Pulse 84   Temp 98.6  F (37 C)    Constitutional: Overweight African-American female in wheelchair, in acute distress, tearful and leaning over trash bin Well developed, Well nourished, alert and cooperative Respiratory: Respirations even and unlabored. Lungs clear to auscultation bilaterally.   No wheezes, crackles, or rhonchi.  Cardiovascular: Normal S1, S2. No MRG. Regular rate and rhythm. No peripheral edema, cyanosis or pallor.  Gastrointestinal:  Soft, mild distention, marked tenderness in the left lower  quadrant. No rebound or guarding. Normal bowel sounds. No appreciable masses or hepatomegaly.(Exam hindered by patient's extreme pain and nausea) Rectal:  Not performed.  Urinary: Indwelling catheter Msk:  Ambulating in wheelchair Symmetrical without gross deformities.  Neurologic:  Alert and  oriented x4;  grossly normal neurologically.  Skin:   Dry and intact without significant lesions or rashes. Psychiatric:  Tearful, sobbing, Demonstrates good judgement and reason without abnormal affect or behaviors.  RELEVANT LABS AND IMAGING: CBC    Component Value Date/Time   WBC 11.0 (H) 12/27/2015 1330   RBC 3.57 (L) 12/27/2015 1330   HGB 10.0 (L) 12/27/2015 1330   HGB 11.9 07/13/2005 1527   HCT 31.9 (L) 12/27/2015 1330   HCT 34.8 07/13/2005 1527   PLT 196 12/27/2015 1330   PLT 223 07/13/2005 1527   MCV 89.4 12/27/2015 1330   MCV 91.0 07/13/2005 1527   MCH 28.0 12/27/2015 1330   MCHC 31.3 12/27/2015 1330   RDW 15.4 12/27/2015 1330   RDW 14.9 (H) 07/13/2005 1527   LYMPHSABS 2.8 12/27/2015 1330   LYMPHSABS 7.2 (H) 07/13/2005 1527   MONOABS 0.9 12/27/2015 1330   MONOABS 0.7 07/13/2005 1527   EOSABS 0.2 12/27/2015 1330   EOSABS 0.3 07/13/2005 1527   BASOSABS 0.0 12/27/2015 1330   BASOSABS 0.0 07/13/2005 1527    CMP     Component Value Date/Time   NA 140 12/27/2015 1330   K 4.3 12/27/2015 1330   CL 105 12/27/2015 1330   CO2 24 12/27/2015 1330   GLUCOSE 96 12/27/2015 1330   BUN 80 (H)  12/27/2015 1330   CREATININE 4.87 (H) 12/27/2015 1330   CALCIUM 9.2 12/27/2015 1330   PROT 7.4 12/27/2015 1330   ALBUMIN 3.9 12/27/2015 1330   AST 15 12/27/2015 1330   ALT 12 (L) 12/27/2015 1330   ALKPHOS 76 12/27/2015 1330   BILITOT 0.7 12/27/2015 1330   GFRNONAA 9 (L) 12/27/2015 1330   GFRAA 10 (L) 12/27/2015 1330    Assessment: 1. Abdominal pain: Patient with a 10/10 left lower quadrant abdominal pain in tears and so nauseous that she is hanging her head over a trash can during time of my exam, unable to do a full abdominal exam, history is positive for bladder distention and indwelling catheter which sounds like it has been present for a month per her friend, concerned that this is the cause 2. Dyspepsia: Patient continues with symptoms of nausea and vomiting, vomiting as recent as yesterday, current severe nausea, likely related to above 3. Decreased appetite: Likely related to all of the above  Plan: 1. Patient is in such severe pain at time of office visit today that I recommended she proceed to the ER for further evaluation. I believe this is likely related to possible UTI and bladder distention versus GI etiology. Her family member will drive her to the ER. 2. Recommend the patient follow in our office after resolution of acute symptoms if she continues with nausea and vomiting which may all be related to urinary retention/UTI 3. Patient will be a Dr. Havery Moros patient in the future as he is supervising physician this morning and patient is new to clinic.  Ellouise Newer, PA-C [facility] 01/15/2016, 10:19 AM  Cc: Ricke Hey, MD

## 2016-01-15 NOTE — Patient Instructions (Signed)
It has been suggested by your provider that you go to the Emergency Room now.

## 2016-01-15 NOTE — ED Notes (Signed)
A female called stating that she is the daughter of this Pt.  Sts we did nothing for her mother and send her home w/ a diagnosis of constipation.  Sts that her mother has been having "loose stools" not constipation.  This writer attempted to explain that Pts can have diarrhea w/ constipation.  Caller continued to escalate and said that she has been to our ED "numerous times" and we "never do anything."  This writer tried to confirm that the caller was a POA, but nothing could be found in the chart.  This Probation officer was going to attempt to inform caller of labs drawn and imaging performed.  However, caller continued to escalate and screamed "I'll just take her to another doctor and you'll see me in court" and she hung up. (12/20 @ 1720)

## 2016-01-15 NOTE — Progress Notes (Signed)
Agree with assessment and plan as outlined.  

## 2016-01-15 NOTE — ED Notes (Signed)
[facility] called for transportation  

## 2016-01-16 ENCOUNTER — Telehealth: Payer: Self-pay | Admitting: Physician Assistant

## 2016-01-16 DIAGNOSIS — Z794 Long term (current) use of insulin: Secondary | ICD-10-CM | POA: Diagnosis not present

## 2016-01-16 DIAGNOSIS — E1122 Type 2 diabetes mellitus with diabetic chronic kidney disease: Secondary | ICD-10-CM | POA: Diagnosis not present

## 2016-01-16 DIAGNOSIS — Z960 Presence of urogenital implants: Secondary | ICD-10-CM | POA: Diagnosis not present

## 2016-01-16 DIAGNOSIS — N183 Chronic kidney disease, stage 3 (moderate): Secondary | ICD-10-CM | POA: Diagnosis not present

## 2016-01-16 DIAGNOSIS — R339 Retention of urine, unspecified: Secondary | ICD-10-CM | POA: Diagnosis not present

## 2016-01-16 DIAGNOSIS — D508 Other iron deficiency anemias: Secondary | ICD-10-CM | POA: Diagnosis not present

## 2016-01-16 DIAGNOSIS — Z9981 Dependence on supplemental oxygen: Secondary | ICD-10-CM | POA: Diagnosis not present

## 2016-01-16 DIAGNOSIS — G4733 Obstructive sleep apnea (adult) (pediatric): Secondary | ICD-10-CM | POA: Diagnosis not present

## 2016-01-16 DIAGNOSIS — I129 Hypertensive chronic kidney disease with stage 1 through stage 4 chronic kidney disease, or unspecified chronic kidney disease: Secondary | ICD-10-CM | POA: Diagnosis not present

## 2016-01-16 DIAGNOSIS — N3000 Acute cystitis without hematuria: Secondary | ICD-10-CM | POA: Diagnosis not present

## 2016-01-17 ENCOUNTER — Other Ambulatory Visit: Payer: Self-pay

## 2016-01-17 MED ORDER — ONDANSETRON HCL 4 MG PO TABS: 4.0000 mg | ORAL_TABLET | Freq: Four times a day (QID) | ORAL | 1 refills | Status: DC | PRN

## 2016-01-17 NOTE — Telephone Encounter (Signed)
Lvm for patient that her prescription will be sent to her pharmacy, if she has questions or concerns please call office.

## 2016-01-22 DIAGNOSIS — J449 Chronic obstructive pulmonary disease, unspecified: Secondary | ICD-10-CM | POA: Diagnosis not present

## 2016-01-27 ENCOUNTER — Encounter (HOSPITAL_COMMUNITY): Payer: Self-pay | Admitting: *Deleted

## 2016-01-27 ENCOUNTER — Inpatient Hospital Stay (HOSPITAL_COMMUNITY)
Admission: EM | Admit: 2016-01-27 | Discharge: 2016-02-01 | DRG: 682 | Disposition: A | Discharge status: Home Health | Payer: Medicare Other | Attending: Internal Medicine | Admitting: Internal Medicine

## 2016-01-27 ENCOUNTER — Emergency Department (HOSPITAL_COMMUNITY): Payer: Medicare Other

## 2016-01-27 DIAGNOSIS — Z85038 Personal history of other malignant neoplasm of large intestine: Secondary | ICD-10-CM

## 2016-01-27 DIAGNOSIS — K5900 Constipation, unspecified: Secondary | ICD-10-CM | POA: Diagnosis not present

## 2016-01-27 DIAGNOSIS — Z7189 Other specified counseling: Secondary | ICD-10-CM | POA: Diagnosis not present

## 2016-01-27 DIAGNOSIS — E860 Dehydration: Secondary | ICD-10-CM | POA: Diagnosis not present

## 2016-01-27 DIAGNOSIS — I69391 Dysphagia following cerebral infarction: Secondary | ICD-10-CM

## 2016-01-27 DIAGNOSIS — N390 Urinary tract infection, site not specified: Secondary | ICD-10-CM | POA: Diagnosis not present

## 2016-01-27 DIAGNOSIS — I69351 Hemiplegia and hemiparesis following cerebral infarction affecting right dominant side: Secondary | ICD-10-CM | POA: Diagnosis not present

## 2016-01-27 DIAGNOSIS — E1122 Type 2 diabetes mellitus with diabetic chronic kidney disease: Secondary | ICD-10-CM | POA: Diagnosis not present

## 2016-01-27 DIAGNOSIS — J9811 Atelectasis: Secondary | ICD-10-CM | POA: Diagnosis not present

## 2016-01-27 DIAGNOSIS — Z23 Encounter for immunization: Secondary | ICD-10-CM

## 2016-01-27 DIAGNOSIS — Z880 Allergy status to penicillin: Secondary | ICD-10-CM

## 2016-01-27 DIAGNOSIS — I130 Hypertensive heart and chronic kidney disease with heart failure and stage 1 through stage 4 chronic kidney disease, or unspecified chronic kidney disease: Secondary | ICD-10-CM | POA: Diagnosis present

## 2016-01-27 DIAGNOSIS — Z833 Family history of diabetes mellitus: Secondary | ICD-10-CM | POA: Diagnosis not present

## 2016-01-27 DIAGNOSIS — G9340 Encephalopathy, unspecified: Secondary | ICD-10-CM | POA: Diagnosis present

## 2016-01-27 DIAGNOSIS — L89152 Pressure ulcer of sacral region, stage 2: Secondary | ICD-10-CM | POA: Diagnosis present

## 2016-01-27 DIAGNOSIS — Z66 Do not resuscitate: Secondary | ICD-10-CM | POA: Diagnosis not present

## 2016-01-27 DIAGNOSIS — M791 Myalgia: Secondary | ICD-10-CM | POA: Diagnosis present

## 2016-01-27 DIAGNOSIS — Z9889 Other specified postprocedural states: Secondary | ICD-10-CM | POA: Diagnosis not present

## 2016-01-27 DIAGNOSIS — F329 Major depressive disorder, single episode, unspecified: Secondary | ICD-10-CM | POA: Diagnosis present

## 2016-01-27 DIAGNOSIS — D638 Anemia in other chronic diseases classified elsewhere: Secondary | ICD-10-CM | POA: Diagnosis present

## 2016-01-27 DIAGNOSIS — E118 Type 2 diabetes mellitus with unspecified complications: Secondary | ICD-10-CM | POA: Diagnosis present

## 2016-01-27 DIAGNOSIS — I252 Old myocardial infarction: Secondary | ICD-10-CM | POA: Diagnosis not present

## 2016-01-27 DIAGNOSIS — I2510 Atherosclerotic heart disease of native coronary artery without angina pectoris: Secondary | ICD-10-CM | POA: Diagnosis not present

## 2016-01-27 DIAGNOSIS — I69815 Cognitive social or emotional deficit following other cerebrovascular disease: Secondary | ICD-10-CM | POA: Diagnosis not present

## 2016-01-27 DIAGNOSIS — Z79899 Other long term (current) drug therapy: Secondary | ICD-10-CM

## 2016-01-27 DIAGNOSIS — E43 Unspecified severe protein-calorie malnutrition: Secondary | ICD-10-CM | POA: Diagnosis not present

## 2016-01-27 DIAGNOSIS — R1310 Dysphagia, unspecified: Secondary | ICD-10-CM | POA: Diagnosis not present

## 2016-01-27 DIAGNOSIS — N179 Acute kidney failure, unspecified: Secondary | ICD-10-CM | POA: Diagnosis not present

## 2016-01-27 DIAGNOSIS — E1121 Type 2 diabetes mellitus with diabetic nephropathy: Secondary | ICD-10-CM | POA: Diagnosis present

## 2016-01-27 DIAGNOSIS — E861 Hypovolemia: Secondary | ICD-10-CM | POA: Diagnosis present

## 2016-01-27 DIAGNOSIS — Z9989 Dependence on other enabling machines and devices: Secondary | ICD-10-CM

## 2016-01-27 DIAGNOSIS — N184 Chronic kidney disease, stage 4 (severe): Secondary | ICD-10-CM | POA: Diagnosis not present

## 2016-01-27 DIAGNOSIS — Z515 Encounter for palliative care: Secondary | ICD-10-CM

## 2016-01-27 DIAGNOSIS — E7800 Pure hypercholesterolemia, unspecified: Secondary | ICD-10-CM | POA: Diagnosis present

## 2016-01-27 DIAGNOSIS — Z6828 Body mass index (BMI) 28.0-28.9, adult: Secondary | ICD-10-CM

## 2016-01-27 DIAGNOSIS — Z7401 Bed confinement status: Secondary | ICD-10-CM

## 2016-01-27 DIAGNOSIS — Z0000 Encounter for general adult medical examination without abnormal findings: Secondary | ICD-10-CM

## 2016-01-27 DIAGNOSIS — R1110 Vomiting, unspecified: Secondary | ICD-10-CM

## 2016-01-27 DIAGNOSIS — R1111 Vomiting without nausea: Secondary | ICD-10-CM | POA: Diagnosis not present

## 2016-01-27 DIAGNOSIS — Z823 Family history of stroke: Secondary | ICD-10-CM | POA: Diagnosis not present

## 2016-01-27 DIAGNOSIS — D631 Anemia in chronic kidney disease: Secondary | ICD-10-CM | POA: Diagnosis present

## 2016-01-27 DIAGNOSIS — T83511A Infection and inflammatory reaction due to indwelling urethral catheter, initial encounter: Secondary | ICD-10-CM

## 2016-01-27 DIAGNOSIS — Z794 Long term (current) use of insulin: Secondary | ICD-10-CM

## 2016-01-27 DIAGNOSIS — I69320 Aphasia following cerebral infarction: Secondary | ICD-10-CM

## 2016-01-27 DIAGNOSIS — R8281 Pyuria: Secondary | ICD-10-CM

## 2016-01-27 DIAGNOSIS — K219 Gastro-esophageal reflux disease without esophagitis: Secondary | ICD-10-CM | POA: Diagnosis present

## 2016-01-27 DIAGNOSIS — I959 Hypotension, unspecified: Secondary | ICD-10-CM | POA: Diagnosis present

## 2016-01-27 DIAGNOSIS — I5032 Chronic diastolic (congestive) heart failure: Secondary | ICD-10-CM | POA: Diagnosis not present

## 2016-01-27 DIAGNOSIS — G4733 Obstructive sleep apnea (adult) (pediatric): Secondary | ICD-10-CM | POA: Diagnosis not present

## 2016-01-27 DIAGNOSIS — Z7982 Long term (current) use of aspirin: Secondary | ICD-10-CM

## 2016-01-27 DIAGNOSIS — R531 Weakness: Secondary | ICD-10-CM

## 2016-01-27 DIAGNOSIS — G8191 Hemiplegia, unspecified affecting right dominant side: Secondary | ICD-10-CM

## 2016-01-27 DIAGNOSIS — R52 Pain, unspecified: Secondary | ICD-10-CM | POA: Diagnosis not present

## 2016-01-27 DIAGNOSIS — Z7902 Long term (current) use of antithrombotics/antiplatelets: Secondary | ICD-10-CM

## 2016-01-27 DIAGNOSIS — E1100 Type 2 diabetes mellitus with hyperosmolarity without nonketotic hyperglycemic-hyperosmolar coma (NKHHC): Secondary | ICD-10-CM | POA: Diagnosis not present

## 2016-01-27 DIAGNOSIS — Z86718 Personal history of other venous thrombosis and embolism: Secondary | ICD-10-CM

## 2016-01-27 DIAGNOSIS — I69359 Hemiplegia and hemiparesis following cerebral infarction affecting unspecified side: Secondary | ICD-10-CM

## 2016-01-27 DIAGNOSIS — R031 Nonspecific low blood-pressure reading: Secondary | ICD-10-CM | POA: Diagnosis not present

## 2016-01-27 DIAGNOSIS — Z955 Presence of coronary angioplasty implant and graft: Secondary | ICD-10-CM

## 2016-01-27 MED ORDER — SODIUM CHLORIDE 0.9 % IV BOLUS (SEPSIS)
1000.0000 mL | INTRAVENOUS | Status: AC
  Administered 2016-01-27: 1000 mL via INTRAVENOUS

## 2016-01-27 MED ORDER — SODIUM CHLORIDE 0.9 % IV BOLUS (SEPSIS)
500.0000 mL | INTRAVENOUS | Status: AC
  Administered 2016-01-27: 500 mL via INTRAVENOUS

## 2016-01-27 NOTE — ED Provider Notes (Signed)
[facility] DEPT Provider Note   CSN: [id number] Arrival date & time: 01/27/16  2154  By signing my name below, I, Michelle Holmes, attest that this documentation has been prepared under the direction and in the presence of [employer], [place]. Electronically Signed: Neta Holmes, ED Scribe. 01/27/2016. 10:18 PM.   History   Chief Complaint Chief Complaint  Patient presents with  . Influenza    LEVEL 5 CAVEAT SECONDARY TO ACUITY OF CONDITION   The history is provided by the patient. No language interpreter was used.   HPI Comments:  Michelle Holmes is a 66 y.o. female with PMHx of stroke and DM who presents to the Emergency Department brought in by EMS for generalized pain and weakness. Per nurse, pt has been having generalized aches and has malodorous urine from a foley placed on arrival at the ED. Pt lives at home with her aunt. No alleviating factors noted. Pt denies fever.  11:38 PM Family now at bedside. Daughter state that patient has been vomiting x 2 months. Though the patient has not vomited today. She has tolerated a hot dog, ginger ale, and Ensure. Patient already established with GI. Daughters state that the patient has been c/o generalized myalgias today. No fevers PTA. No medications taken for this. Patient also resistant to taking daily medications x 2 months.   Past Medical History:  Diagnosis Date  . Anemia   . CHF (congestive heart failure) ([place])   . Chronic back pain   . COPD (chronic obstructive pulmonary disease) ([place])   . Coronary artery disease   . Diabetes mellitus   . DJD (degenerative joint disease)   . Emphysema   . Hypercholesteremia   . Hypertension   . Myocardial infarction   . Obesity   . Obesity hypoventilation syndrome ([place])   . Renal insufficiency   . Scoliosis   . Sleep apnea   . Stroke ([place])   . TIA (transient ischemic attack)     Patient Active Problem List   Diagnosis Date Noted  . AKI (acute kidney injury) ([place])  12/11/2015  . Anemia   . Acute urinary retention   . Acute kidney injury superimposed on chronic kidney disease ([place])   . Pressure injury of skin 11/24/2015  . Acute metabolic encephalopathy   . Acute renal failure (ARF) ([place]) 11/23/2015  . Chronic kidney disease (CKD), stage IV (severe) ([place]) 07/06/2015  . Acute CVA (cerebrovascular accident) ([place])   . Acute encephalopathy 07/04/2015  . Sinus bradycardia 07/04/2015  . Type 2 diabetes mellitus with vascular disease ([place]) 07/04/2015  . Urinary tract infection without hematuria 07/04/2015  . Parietal lobe infarction ([place]) 07/04/2015  . Spastic hemiplegia affecting nondominant side ([place]) 07/01/2015  . Obesity, morbid ([place]) 07/01/2015  . Absolute anemia   . Thrombocytopenia ([place])   . Acute ischemic VBA thalamic stroke ([place]) 01/18/2015  . Acute kidney injury superimposed on CKD ([place])   . Dysarthria   . Lethargy   . DM type 2 with diabetic peripheral neuropathy ([place])   . Labile blood pressure   . Hyperlipidemia   . History of CVA with residual deficit   . Chronic obstructive pulmonary disease ([place])   . Hemiparesis, aphasia, and dysphagia as late effect of cerebrovascular accident (CVA) ([place])   . Acute ischemic stroke ([place])   . CVA (cerebral vascular accident) ([place]) 01/14/2015  . CVA (cerebral infarction) 01/14/2015  . Right hemiplegia ([place])   . Right sided weakness 01/13/2015  . Abdominal pain 06/20/2014  .  Altered mental status 02/08/2012  . Sleep apnea 12/08/2010  . Morbid obesity ([place]) 12/08/2010  . CHF (congestive heart failure) ([place]) 12/08/2010  . Chronic renal failure 12/08/2010  . TIA (transient ischemic attack) 12/08/2010  . CAD (coronary artery disease) 12/08/2010  . Cellulitis of pubic region 12/01/2010  . Uncontrolled type 2 DM with hyperosmolar nonketotic hyperglycemia ([place]) 12/01/2010    Past Surgical History:  Procedure Laterality Date  . COLON SURGERY    . CORONARY ANGIOPLASTY WITH STENT PLACEMENT      OB  History    No data available       Home Medications    Prior to Admission medications   Medication Sig Start Date End Date Taking? Authorizing Provider  amLODipine (NORVASC) 5 MG tablet Take 5 mg by mouth at bedtime.    Historical Provider, MD  aspirin EC 81 MG EC tablet Take 1 tablet (81 mg total) by mouth daily. 11/30/15   Florencia Reasons, MD  atorvastatin (LIPITOR) 80 MG tablet Take 1 tablet (80 mg total) by mouth daily at 6 PM. Patient taking differently: Take 40 mg by mouth daily at 6 PM.  07/07/15   Mauricio Gerome Apley, MD  clopidogrel (PLAVIX) 75 MG tablet Take 1 tablet (75 mg total) by mouth daily. 02/07/15   Lavon Paganini Angiulli, PA-C  ferrous sulfate 325 (65 FE) MG tablet Take 1 tablet (325 mg total) by mouth daily with breakfast. 11/29/15   Florencia Reasons, MD  furosemide (LASIX) 20 MG tablet Take 1 tablet (20 mg total) by mouth every Monday, Wednesday, and Friday. Please start first dose on Wednesday 11/22 12/18/15   Albertine Patricia, MD  HYDROcodone-acetaminophen (NORCO/VICODIN) 5-325 MG tablet Take 1 tablet by mouth every 4 (four) hours as needed for severe pain. 09/26/15   [name], MD  Insulin Glargine (LANTUS SOLOSTAR) 100 UNIT/ML Solostar Pen Inject 10 Units into the skin at bedtime. 12/14/15   Silver Huguenin Elgergawy, MD  LYRICA 50 MG capsule Take 1 capsule (50 mg total) by mouth daily. Patient taking differently: Take 50 mg by mouth 2 (two) times daily.  07/14/15   Silver Huguenin Elgergawy, MD  metoprolol succinate (TOPROL-XL) 50 MG 24 hr tablet Take 50 mg by mouth daily. Take with or immediately following a meal.    Historical Provider, MD  ondansetron (ZOFRAN) 4 MG tablet Take 1 tablet (4 mg total) by mouth every 6 (six) hours as needed for nausea or vomiting. 01/17/16   Levin Erp, PA  Oxycodone HCl 20 MG TABS Take 20 mg by mouth 3 (three) times daily as needed for pain. 11/21/15   Historical Provider, MD  pantoprazole (PROTONIX) 40 MG tablet Take 40 mg by mouth daily. 09/17/15    Historical Provider, MD  polyethylene glycol powder (MIRALAX) powder Take 17 g by mouth daily. 01/15/16   Jola Schmidt, MD  senna-docusate (SENOKOT-S) 8.6-50 MG tablet Take 1 tablet by mouth at bedtime. Do not take if diarrhea 11/29/15   Florencia Reasons, MD  sodium bicarbonate 650 MG tablet Take 1 tablet (650 mg total) by mouth daily. 11/29/15   Florencia Reasons, MD  traMADol Veatrice [place]) 50 MG tablet take 1 TABLET BY MOUTH EVERY 12 hours 12/30/15   Charlett Blake, MD    Family History Family History  Problem Relation Age of Onset  . Diabetes Mother   . Stroke Maternal Aunt     Social History Social History  Substance Use Topics  . Smoking status: Never Smoker  . Smokeless tobacco:  Never Used  . Alcohol use No     Allergies   Penicillins   Review of Systems Review of Systems 10 Systems reviewed and are negative for acute change except as noted in the HPI.   Physical Exam Updated Vital Signs BP (!) 88/68   Pulse 85   Temp 99.7 F (37.6 C) (Rectal)   Resp 16   Ht 5\' 5"  (1.651 m)   Wt 83.9 kg   SpO2 93%   BMI 30.79 kg/m   Physical Exam  Constitutional: She is oriented to person, place, and time. She appears well-developed and well-nourished. No distress.  Patient appears lethargic, but nontoxic.  HENT:  Head: Normocephalic and atraumatic.  Dry mm. Chelitis.  Eyes: Conjunctivae and EOM are normal. No scleral icterus.  Neck: Normal range of motion.  Cardiovascular: Normal rate, regular rhythm and intact distal pulses.   Pulmonary/Chest: Effort normal. No respiratory distress. She has no wheezes.  Chest expansion symmetric. Lungs grossly clear.  Musculoskeletal: Normal range of motion.  Neurological: She is alert and oriented to person, place, and time. She exhibits normal muscle tone. Coordination normal.  Skin: Skin is warm and dry. No rash noted. She is not diaphoretic. No erythema. No pallor.  Psychiatric: She has a normal mood and affect. Her behavior is normal.  Nursing note  and vitals reviewed.   ^sacral ulcer, stage III   ED Treatments / Results  DIAGNOSTIC STUDIES:  Oxygen Saturation is 99% on RA, normal by my interpretation.    COORDINATION OF CARE:  10:18 PM  Discussed treatment plan with pt at bedside and pt agreed to plan.   Labs (all labs ordered are listed, but only abnormal results are displayed) Labs Reviewed  CBC WITH DIFFERENTIAL/PLATELET - Abnormal; Notable for the following:       Result Value   RBC 3.71 (*)    Hemoglobin 10.2 (*)    HCT 32.5 (*)    RDW 15.9 (*)    All other components within normal limits  COMPREHENSIVE METABOLIC PANEL - Abnormal; Notable for the following:    Potassium 5.3 (*)    CO2 19 (*)    Glucose, Bld 107 (*)    BUN 90 (*)    Creatinine, Ser 4.10 (*)    ALT 10 (*)    GFR calc non Af Amer 11 (*)    GFR calc Af Amer 12 (*)    All other components within normal limits  URINALYSIS, ROUTINE W REFLEX MICROSCOPIC - Abnormal; Notable for the following:    Hgb urine dipstick SMALL (*)    Leukocytes, UA LARGE (*)    Bacteria, UA RARE (*)    All other components within normal limits  CBG MONITORING, ED - Abnormal; Notable for the following:    Glucose-Capillary 100 (*)    All other components within normal limits  URINE CULTURE  CULTURE, BLOOD (ROUTINE X 2)  CULTURE, BLOOD (ROUTINE X 2)  GASTROINTESTINAL PANEL BY PCR, STOOL (REPLACES STOOL CULTURE)  C DIFFICILE QUICK SCREEN W PCR REFLEX  RESPIRATORY PANEL BY PCR  CK  RAPID URINE DRUG SCREEN, HOSP PERFORMED  INFLUENZA PANEL BY PCR (TYPE A & B, H1N1)  BLOOD GAS, VENOUS  I-STAT CG4 LACTIC ACID, ED  I-STAT CG4 LACTIC ACID, ED    EKG  EKG Interpretation None       Radiology Dg Chest Port 1 View  Result Date: 01/27/2016 CLINICAL DATA:  Hypotension EXAM: PORTABLE CHEST 1 VIEW COMPARISON:  12/27/2015 FINDINGS: The patient  is markedly rotated. Right CP angle non included. Mild atelectasis at the left base. No consolidation or gross E fusion. Stable  enlarged cardiomediastinal allowing for rotation. No pneumothorax. IMPRESSION: Mild streaky atelectasis at the left lung base. Otherwise no acute interval changes allowing for patient rotation. Electronically Signed   By: Donavan Foil M.D.   On: 01/27/2016 22:42    Procedures Procedures (including critical care time)  Medications Ordered in ED Medications  lactated ringers infusion (not administered)  sodium chloride 0.9 % bolus 1,000 mL (0 mLs Intravenous Stopped 01/27/16 2340)  sodium chloride 0.9 % bolus 500 mL (0 mLs Intravenous Stopped 01/28/16 0142)  sodium chloride 0.9 % bolus 1,000 mL (1,000 mLs Intravenous New Bag/Given 01/28/16 0219)  cefTRIAXone (ROCEPHIN) 1 g in dextrose 5 % 50 mL IVPB (1 g Intravenous New Bag/Given 01/28/16 0219)     Initial Impression / Assessment and Plan / ED Course  I have reviewed the triage vital signs and the nursing notes.  Pertinent labs & imaging results that were available during my care of the patient were reviewed by me and considered in my medical decision making (see chart for details).  Clinical Course     66 year old female with multiple comorbidities presents to the emergency department for diffuse myalgias. Daughters report that patient was complaining of diffuse body aches today. She had no complaints of pain during my assessment with her. She has been resting comfortably since arrival. Hypotension noted on presentation to the emergency department. This has persisted despite 2.5 L of IV fluids. I do not have a clear cause for the patient's hypotension. I do not believe that the patient is hypotensive secondary to septic shock given lack of leukocytosis, elevated lactic acid level, lack of fever, and no compensatory tachycardia. Urine drug screen added as patient is prescribed daily narcotics. Patient with a chronic indwelling Foley catheter. She does have evidence of pyuria. It is possible this is secondary to chronic colonization. The patient has  been given one dose of Rocephin for coverage of urinary tract infection given her hypotension today.  Question whether persistent hypotension may be due to chronic dehydration. Patient has evidence of acute kidney injury with elevated creatinine level and increased BUN compared to hospital discharge in November. Will continue with IV fluid hydration and admit for observation. Case discussed with Dr. Loleta Books of [facility] who will admit.   Final Clinical Impressions(s) / ED Diagnoses   Final diagnoses:  Hypotension, unspecified hypotension type  AKI (acute kidney injury) ([place])  Pyuria    New Prescriptions New Prescriptions   No medications on file    I personally performed the services described in this documentation, which was scribed in my presence. The recorded information has been reviewed and is accurate.      Antonietta Breach, PA-C 01/28/16 0307    Antonietta Breach, PA-C 01/28/16 7619    Leo Grosser, MD 01/28/16 [phone]

## 2016-01-27 NOTE — ED Triage Notes (Signed)
Patient is alert and oriented x2.  Brought in from home by EMS for generalized pain and weakness.  Patient had no fever on arrival to the ED.  Patient does have a urine smell from a foley that was in place on arrival to the ED.

## 2016-01-27 NOTE — ED Notes (Signed)
Unsuccessful attempts to draw labs. Nurse informed.

## 2016-01-27 NOTE — ED Notes (Signed)
Bed: WA17 Expected date:  Expected time:  Means of arrival:  Comments: 66 yo F  Pain all over, decub to buttocks

## 2016-01-27 NOTE — ED Notes (Signed)
Patient difficult to draw blood from.  Lab called.  PA notified

## 2016-01-27 NOTE — ED Notes (Signed)
Blood draw unsuccessful x 1

## 2016-01-28 ENCOUNTER — Observation Stay (HOSPITAL_COMMUNITY): Payer: Medicare Other

## 2016-01-28 ENCOUNTER — Observation Stay (HOSPITAL_BASED_OUTPATIENT_CLINIC_OR_DEPARTMENT_OTHER): Payer: Medicare Other

## 2016-01-28 ENCOUNTER — Encounter (HOSPITAL_COMMUNITY): Payer: Self-pay | Admitting: Family Medicine

## 2016-01-28 DIAGNOSIS — G43A0 Cyclical vomiting, not intractable: Secondary | ICD-10-CM | POA: Diagnosis not present

## 2016-01-28 DIAGNOSIS — I69815 Cognitive social or emotional deficit following other cerebrovascular disease: Secondary | ICD-10-CM | POA: Diagnosis not present

## 2016-01-28 DIAGNOSIS — E1121 Type 2 diabetes mellitus with diabetic nephropathy: Secondary | ICD-10-CM | POA: Diagnosis present

## 2016-01-28 DIAGNOSIS — Z823 Family history of stroke: Secondary | ICD-10-CM | POA: Diagnosis not present

## 2016-01-28 DIAGNOSIS — K5900 Constipation, unspecified: Secondary | ICD-10-CM | POA: Diagnosis not present

## 2016-01-28 DIAGNOSIS — Z794 Long term (current) use of insulin: Secondary | ICD-10-CM | POA: Diagnosis not present

## 2016-01-28 DIAGNOSIS — Z833 Family history of diabetes mellitus: Secondary | ICD-10-CM | POA: Diagnosis not present

## 2016-01-28 DIAGNOSIS — E1100 Type 2 diabetes mellitus with hyperosmolarity without nonketotic hyperglycemic-hyperosmolar coma (NKHHC): Secondary | ICD-10-CM | POA: Diagnosis present

## 2016-01-28 DIAGNOSIS — D638 Anemia in other chronic diseases classified elsewhere: Secondary | ICD-10-CM | POA: Diagnosis not present

## 2016-01-28 DIAGNOSIS — E1122 Type 2 diabetes mellitus with diabetic chronic kidney disease: Secondary | ICD-10-CM | POA: Diagnosis present

## 2016-01-28 DIAGNOSIS — N184 Chronic kidney disease, stage 4 (severe): Secondary | ICD-10-CM

## 2016-01-28 DIAGNOSIS — G8191 Hemiplegia, unspecified affecting right dominant side: Secondary | ICD-10-CM

## 2016-01-28 DIAGNOSIS — R1111 Vomiting without nausea: Secondary | ICD-10-CM

## 2016-01-28 DIAGNOSIS — I130 Hypertensive heart and chronic kidney disease with heart failure and stage 1 through stage 4 chronic kidney disease, or unspecified chronic kidney disease: Secondary | ICD-10-CM | POA: Diagnosis present

## 2016-01-28 DIAGNOSIS — Z79899 Other long term (current) drug therapy: Secondary | ICD-10-CM | POA: Diagnosis not present

## 2016-01-28 DIAGNOSIS — I252 Old myocardial infarction: Secondary | ICD-10-CM | POA: Diagnosis not present

## 2016-01-28 DIAGNOSIS — I69359 Hemiplegia and hemiparesis following cerebral infarction affecting unspecified side: Secondary | ICD-10-CM

## 2016-01-28 DIAGNOSIS — Z7189 Other specified counseling: Secondary | ICD-10-CM | POA: Diagnosis not present

## 2016-01-28 DIAGNOSIS — Z0000 Encounter for general adult medical examination without abnormal findings: Secondary | ICD-10-CM

## 2016-01-28 DIAGNOSIS — Z9989 Dependence on other enabling machines and devices: Secondary | ICD-10-CM

## 2016-01-28 DIAGNOSIS — R1310 Dysphagia, unspecified: Secondary | ICD-10-CM | POA: Diagnosis present

## 2016-01-28 DIAGNOSIS — I959 Hypotension, unspecified: Secondary | ICD-10-CM | POA: Diagnosis not present

## 2016-01-28 DIAGNOSIS — E860 Dehydration: Secondary | ICD-10-CM | POA: Diagnosis not present

## 2016-01-28 DIAGNOSIS — Z9889 Other specified postprocedural states: Secondary | ICD-10-CM | POA: Diagnosis not present

## 2016-01-28 DIAGNOSIS — L8992 Pressure ulcer of unspecified site, stage 2: Secondary | ICD-10-CM | POA: Diagnosis not present

## 2016-01-28 DIAGNOSIS — N390 Urinary tract infection, site not specified: Secondary | ICD-10-CM | POA: Diagnosis present

## 2016-01-28 DIAGNOSIS — I5032 Chronic diastolic (congestive) heart failure: Secondary | ICD-10-CM | POA: Diagnosis present

## 2016-01-28 DIAGNOSIS — G9340 Encephalopathy, unspecified: Secondary | ICD-10-CM | POA: Diagnosis present

## 2016-01-28 DIAGNOSIS — I69351 Hemiplegia and hemiparesis following cerebral infarction affecting right dominant side: Secondary | ICD-10-CM | POA: Diagnosis not present

## 2016-01-28 DIAGNOSIS — M79609 Pain in unspecified limb: Secondary | ICD-10-CM | POA: Diagnosis not present

## 2016-01-28 DIAGNOSIS — L89152 Pressure ulcer of sacral region, stage 2: Secondary | ICD-10-CM | POA: Diagnosis present

## 2016-01-28 DIAGNOSIS — Z515 Encounter for palliative care: Secondary | ICD-10-CM

## 2016-01-28 DIAGNOSIS — N179 Acute kidney failure, unspecified: Secondary | ICD-10-CM | POA: Diagnosis not present

## 2016-01-28 DIAGNOSIS — I69320 Aphasia following cerebral infarction: Secondary | ICD-10-CM

## 2016-01-28 DIAGNOSIS — G4733 Obstructive sleep apnea (adult) (pediatric): Secondary | ICD-10-CM | POA: Diagnosis present

## 2016-01-28 DIAGNOSIS — E43 Unspecified severe protein-calorie malnutrition: Secondary | ICD-10-CM | POA: Insufficient documentation

## 2016-01-28 DIAGNOSIS — Z66 Do not resuscitate: Secondary | ICD-10-CM | POA: Diagnosis not present

## 2016-01-28 DIAGNOSIS — Z23 Encounter for immunization: Secondary | ICD-10-CM | POA: Diagnosis not present

## 2016-01-28 DIAGNOSIS — R1110 Vomiting, unspecified: Secondary | ICD-10-CM

## 2016-01-28 DIAGNOSIS — I2510 Atherosclerotic heart disease of native coronary artery without angina pectoris: Secondary | ICD-10-CM | POA: Diagnosis present

## 2016-01-28 DIAGNOSIS — E861 Hypovolemia: Secondary | ICD-10-CM | POA: Diagnosis present

## 2016-01-28 DIAGNOSIS — F329 Major depressive disorder, single episode, unspecified: Secondary | ICD-10-CM | POA: Diagnosis present

## 2016-01-28 DIAGNOSIS — I69391 Dysphagia following cerebral infarction: Secondary | ICD-10-CM

## 2016-01-28 LAB — COMPREHENSIVE METABOLIC PANEL
ALT: 10 U/L — ABNORMAL LOW (ref 14–54)
ALT: 11 U/L — ABNORMAL LOW (ref 14–54)
AST: 12 U/L — ABNORMAL LOW (ref 15–41)
AST: 15 U/L (ref 15–41)
Albumin: 3.2 g/dL — ABNORMAL LOW (ref 3.5–5.0)
Albumin: 3.5 g/dL (ref 3.5–5.0)
Alkaline Phosphatase: 58 U/L (ref 38–126)
Alkaline Phosphatase: 67 U/L (ref 38–126)
Anion gap: 10 (ref 5–15)
Anion gap: 9 (ref 5–15)
BUN: 85 mg/dL — ABNORMAL HIGH (ref 6–20)
BUN: 90 mg/dL — ABNORMAL HIGH (ref 6–20)
CO2: 19 mmol/L — ABNORMAL LOW (ref 22–32)
CO2: 19 mmol/L — ABNORMAL LOW (ref 22–32)
Calcium: 8.5 mg/dL — ABNORMAL LOW (ref 8.9–10.3)
Calcium: 8.9 mg/dL (ref 8.9–10.3)
Chloride: 110 mmol/L (ref 101–111)
Chloride: 112 mmol/L — ABNORMAL HIGH (ref 101–111)
Creatinine, Ser: 3.95 mg/dL — ABNORMAL HIGH (ref 0.44–1.00)
Creatinine, Ser: 4.1 mg/dL — ABNORMAL HIGH (ref 0.44–1.00)
GFR calc Af Amer: 12 mL/min — ABNORMAL LOW (ref >60)
GFR calc Af Amer: 13 mL/min — ABNORMAL LOW (ref >60)
GFR calc non Af Amer: 11 mL/min — ABNORMAL LOW (ref >60)
GFR calc non Af Amer: 11 mL/min — ABNORMAL LOW (ref >60)
Glucose, Bld: 107 mg/dL — ABNORMAL HIGH (ref 65–99)
Glucose, Bld: 107 mg/dL — ABNORMAL HIGH (ref 65–99)
Potassium: 5 mmol/L (ref 3.5–5.1)
Potassium: 5.3 mmol/L — ABNORMAL HIGH (ref 3.5–5.1)
Sodium: 139 mmol/L (ref 135–145)
Sodium: 140 mmol/L (ref 135–145)
Total Bilirubin: 0.5 mg/dL (ref 0.3–1.2)
Total Bilirubin: 0.7 mg/dL (ref 0.3–1.2)
Total Protein: 6.4 g/dL — ABNORMAL LOW (ref 6.5–8.1)
Total Protein: 6.9 g/dL (ref 6.5–8.1)

## 2016-01-28 LAB — CBC WITH DIFFERENTIAL/PLATELET
Basophils Absolute: 0 10*3/uL (ref 0.0–0.1)
Basophils Relative: 0 %
Eosinophils Absolute: 0.2 10*3/uL (ref 0.0–0.7)
Eosinophils Relative: 2 %
HCT: 32.5 % — ABNORMAL LOW (ref 36.0–46.0)
Hemoglobin: 10.2 g/dL — ABNORMAL LOW (ref 12.0–15.0)
Lymphocytes Relative: 32 %
Lymphs Abs: 2.9 10*3/uL (ref 0.7–4.0)
MCH: 27.5 pg (ref 26.0–34.0)
MCHC: 31.4 g/dL (ref 30.0–36.0)
MCV: 87.6 fL (ref 78.0–100.0)
Monocytes Absolute: 0.6 10*3/uL (ref 0.1–1.0)
Monocytes Relative: 6 %
Neutro Abs: 5.5 10*3/uL (ref 1.7–7.7)
Neutrophils Relative %: 60 %
Platelets: ADEQUATE 10*3/uL (ref 150–400)
RBC: 3.71 MIL/uL — ABNORMAL LOW (ref 3.87–5.11)
RDW: 15.9 % — ABNORMAL HIGH (ref 11.5–15.5)
Smear Review: ADEQUATE
WBC: 9.2 10*3/uL (ref 4.0–10.5)

## 2016-01-28 LAB — RESPIRATORY PANEL BY PCR

## 2016-01-28 LAB — CBC
HCT: 29.1 % — ABNORMAL LOW (ref 36.0–46.0)
Hemoglobin: 9.2 g/dL — ABNORMAL LOW (ref 12.0–15.0)
MCH: 27.8 pg (ref 26.0–34.0)
MCHC: 31.6 g/dL (ref 30.0–36.0)
MCV: 87.9 fL (ref 78.0–100.0)
Platelets: 178 10*3/uL (ref 150–400)
RBC: 3.31 MIL/uL — ABNORMAL LOW (ref 3.87–5.11)
RDW: 16 % — ABNORMAL HIGH (ref 11.5–15.5)
WBC: 8.1 10*3/uL (ref 4.0–10.5)

## 2016-01-28 LAB — PROTIME-INR
INR: 1.13
Prothrombin Time: 14.5 seconds (ref 11.4–15.2)

## 2016-01-28 LAB — GLUCOSE, CAPILLARY
Glucose-Capillary: 96 mg/dL (ref 65–99)
Glucose-Capillary: 115 mg/dL — ABNORMAL HIGH (ref 65–99)
Glucose-Capillary: 123 mg/dL — ABNORMAL HIGH (ref 65–99)
Glucose-Capillary: 114 mg/dL — ABNORMAL HIGH (ref 65–99)
Glucose-Capillary: 120 mg/dL — ABNORMAL HIGH (ref 65–99)

## 2016-01-28 LAB — URINALYSIS, ROUTINE W REFLEX MICROSCOPIC
Bilirubin Urine: NEGATIVE
Glucose, UA: NEGATIVE mg/dL
Ketones, ur: NEGATIVE mg/dL
Nitrite: NEGATIVE
Protein, ur: NEGATIVE mg/dL
Specific Gravity, Urine: 1.013 (ref 1.005–1.030)
Squamous Epithelial / LPF: NONE SEEN
pH: 5 (ref 5.0–8.0)

## 2016-01-28 LAB — RAPID URINE DRUG SCREEN, HOSP PERFORMED
Amphetamines: NOT DETECTED
Barbiturates: NOT DETECTED
Benzodiazepines: NOT DETECTED
Cocaine: NOT DETECTED
Opiates: NOT DETECTED
Tetrahydrocannabinol: NOT DETECTED

## 2016-01-28 LAB — INFLUENZA PANEL BY PCR (TYPE A & B)
Influenza A By PCR: NEGATIVE
Influenza B By PCR: NEGATIVE

## 2016-01-28 LAB — I-STAT CG4 LACTIC ACID, ED: Lactic Acid, Venous: 1.48 mmol/L (ref 0.5–1.9)

## 2016-01-28 LAB — CK: Total CK: 69 U/L (ref 38–234)

## 2016-01-28 LAB — APTT: aPTT: 30 seconds (ref 24–36)

## 2016-01-28 LAB — CBG MONITORING, ED: Glucose-Capillary: 100 mg/dL — ABNORMAL HIGH (ref 65–99)

## 2016-01-28 LAB — MRSA PCR SCREENING: MRSA by PCR: NEGATIVE

## 2016-01-28 MED ORDER — ACETAMINOPHEN 325 MG PO TABS
650.0000 mg | ORAL_TABLET | Freq: Four times a day (QID) | ORAL | Status: DC | PRN
  Filled 2016-01-28: qty 2

## 2016-01-28 MED ORDER — SODIUM CHLORIDE 0.9 % IV BOLUS (SEPSIS)
1000.0000 mL | INTRAVENOUS | Status: AC
  Administered 2016-01-28: 1000 mL via INTRAVENOUS

## 2016-01-28 MED ORDER — ADULT MULTIVITAMIN W/MINERALS CH
1.0000 | ORAL_TABLET | Freq: Every day | ORAL | Status: DC
  Filled 2016-01-28: qty 1

## 2016-01-28 MED ORDER — CLOPIDOGREL BISULFATE 75 MG PO TABS
75.0000 mg | ORAL_TABLET | Freq: Every day | ORAL | Status: DC
  Filled 2016-01-28: qty 1

## 2016-01-28 MED ORDER — ONDANSETRON HCL 4 MG/2ML IJ SOLN: 4.0000 mg | Freq: Four times a day (QID) | INTRAMUSCULAR | Status: DC | PRN

## 2016-01-28 MED ORDER — FERROUS SULFATE 325 (65 FE) MG PO TABS
325.0000 mg | ORAL_TABLET | Freq: Every day | ORAL | Status: DC
  Filled 2016-01-28 (×2): qty 1

## 2016-01-28 MED ORDER — ENSURE ENLIVE PO LIQD
237.0000 mL | ORAL | Status: DC
  Administered 2016-01-28: 237 mL via ORAL

## 2016-01-28 MED ORDER — SODIUM CHLORIDE 0.9% FLUSH
3.0000 mL | Freq: Two times a day (BID) | INTRAVENOUS | Status: DC
  Administered 2016-01-28 – 2016-01-29 (×2): 3 mL via INTRAVENOUS

## 2016-01-28 MED ORDER — PANTOPRAZOLE SODIUM 40 MG PO TBEC: 40.0000 mg | DELAYED_RELEASE_TABLET | Freq: Every day | ORAL | Status: DC

## 2016-01-28 MED ORDER — HEPARIN SODIUM (PORCINE) 5000 UNIT/ML IJ SOLN
5000.0000 [IU] | Freq: Three times a day (TID) | INTRAMUSCULAR | Status: DC
  Administered 2016-01-28: 5000 [IU] via SUBCUTANEOUS
  Filled 2016-01-28: qty 1

## 2016-01-28 MED ORDER — PNEUMOCOCCAL VAC POLYVALENT 25 MCG/0.5ML IJ INJ
0.5000 mL | INJECTION | INTRAMUSCULAR | Status: DC
  Filled 2016-01-28: qty 0.5

## 2016-01-28 MED ORDER — OLANZAPINE 5 MG PO TBDP
2.5000 mg | ORAL_TABLET | Freq: Every day | ORAL | Status: DC
  Filled 2016-01-28: qty 0.5

## 2016-01-28 MED ORDER — SODIUM BICARBONATE 650 MG PO TABS
650.0000 mg | ORAL_TABLET | Freq: Every day | ORAL | Status: DC
  Filled 2016-01-28 (×2): qty 1

## 2016-01-28 MED ORDER — HYDROMORPHONE HCL 1 MG/ML IJ SOLN: 0.5000 mg | INTRAMUSCULAR | Status: DC | PRN

## 2016-01-28 MED ORDER — INFLUENZA VAC SPLIT QUAD 0.5 ML IM SUSY
0.5000 mL | PREFILLED_SYRINGE | INTRAMUSCULAR | Status: AC
  Administered 2016-01-29: 0.5 mL via INTRAMUSCULAR
  Filled 2016-01-28: qty 0.5

## 2016-01-28 MED ORDER — SORBITOL 70 % SOLN
960.0000 mL | TOPICAL_OIL | Freq: Once | ORAL | Status: DC
  Filled 2016-01-28: qty 240

## 2016-01-28 MED ORDER — ACETAMINOPHEN 650 MG RE SUPP: 650.0000 mg | Freq: Four times a day (QID) | RECTAL | Status: DC | PRN

## 2016-01-28 MED ORDER — INSULIN ASPART 100 UNIT/ML ~~LOC~~ SOLN: 0.0000 [IU] | Freq: Every day | SUBCUTANEOUS | Status: DC

## 2016-01-28 MED ORDER — BOOST / RESOURCE BREEZE PO LIQD: 1.0000 | Freq: Two times a day (BID) | ORAL | Status: DC

## 2016-01-28 MED ORDER — DEXTROSE 5 % IV SOLN
1.0000 g | Freq: Once | INTRAVENOUS | Status: AC
  Administered 2016-01-28: 1 g via INTRAVENOUS
  Filled 2016-01-28: qty 10

## 2016-01-28 MED ORDER — PANTOPRAZOLE SODIUM 40 MG IV SOLR
40.0000 mg | INTRAVENOUS | Status: DC
  Administered 2016-01-28: 40 mg via INTRAVENOUS

## 2016-01-28 MED ORDER — SODIUM CHLORIDE 0.9 % IV SOLN
INTRAVENOUS | Status: AC
  Administered 2016-01-28: 05:00:00 via INTRAVENOUS

## 2016-01-28 MED ORDER — LACTATED RINGERS IV SOLN: INTRAVENOUS | Status: DC

## 2016-01-28 MED ORDER — OLANZAPINE 5 MG PO TBDP
5.0000 mg | ORAL_TABLET | Freq: Every day | ORAL | Status: DC
  Filled 2016-01-28: qty 1

## 2016-01-28 MED ORDER — INSULIN ASPART 100 UNIT/ML ~~LOC~~ SOLN: 0.0000 [IU] | Freq: Three times a day (TID) | SUBCUTANEOUS | Status: DC

## 2016-01-28 MED ORDER — MORPHINE SULFATE (PF) 2 MG/ML IV SOLN: 0.5000 mg | INTRAVENOUS | Status: DC | PRN

## 2016-01-28 MED ORDER — HEPARIN BOLUS VIA INFUSION
1500.0000 [IU] | Freq: Once | INTRAVENOUS | Status: AC
  Administered 2016-01-28: 1500 [IU] via INTRAVENOUS
  Filled 2016-01-28: qty 1500

## 2016-01-28 MED ORDER — HEPARIN (PORCINE) IN NACL 100-0.45 UNIT/ML-% IJ SOLN
1100.0000 [IU]/h | INTRAMUSCULAR | Status: DC
  Administered 2016-01-28: 1100 [IU]/h via INTRAVENOUS
  Filled 2016-01-28: qty 250

## 2016-01-28 MED ORDER — DEXTROSE 5 % IV SOLN
1.0000 g | INTRAVENOUS | Status: DC
  Administered 2016-01-28: 1 g via INTRAVENOUS
  Filled 2016-01-28: qty 10

## 2016-01-28 MED ORDER — SODIUM CHLORIDE 0.9 % IV BOLUS (SEPSIS)
500.0000 mL | Freq: Once | INTRAVENOUS | Status: AC
  Administered 2016-01-28: 500 mL via INTRAVENOUS

## 2016-01-28 NOTE — Progress Notes (Signed)
PROGRESS NOTE    Michelle Holmes  CVE:[id number] DOB: 03-Jul-1950 DOA: 01/27/2016 PCP: Ricke Hey, MD    Brief Narrative: Patient is a chronically ill 66 year old female currently bedbound from sequela is of stroke 7 bedsores who was brought to the ED per family with complaints of pain in repositioning in bed in pain all over and found to be hypotensive. Unlikely to be septic shock. Patient history suggest a prolonged period of poor oral intake diarrhea and vomiting leading to hypovolemia.   Assessment & Plan:   Principal Problem:   Hypotension Active Problems:   Uncontrolled type 2 DM with hyperosmolar nonketotic hyperglycemia (HCC)   OSA on CPAP   Morbid obesity (HCC)   Right sided weakness   Right hemiplegia (HCC)   Hemiparesis, aphasia, and dysphagia as late effect of cerebrovascular accident (CVA) ([place])   Urinary tract infection without hematuria   Chronic kidney disease (CKD), stage IV (severe) (HCC)   Anemia in other chronic diseases classified elsewhere   Chronic diastolic CHF (congestive heart failure) (HCC)   Decubitus ulcer of sacral region, stage 2   Dehydration  #1 hypotension Likely secondary to hypovolemic hypotension secondary to dehydration as patient noted to have extremely dry mucous membranes on examination. Patient noted to have had decreased oral intakeand vomiting and diarrhea prior to admission. Patient has been pancultured and results pending. Systolic blood pressure in the 80s. Patient with no cardiac symptoms.Will give a 500 mL bolus of normal saline. Continue IV fluids at 100 mL/h.continue empiric IV Rocephin. Follow.  #2 dehydration IV fluids.  #3 probable UTI Urine cultures pending. Continue IV Rocephin.  #4 chronic kidney disease stage IV/5 Patient is likely baseline Cr 3-4.currently at baseline.patient's diureticsan antihypertensive medications on hold. Continue IV fluids. Continue bicarbonate tablets.Follow.  #5 sacral decubitus stage  II Stable. Place on air overlay mattress. Frequent turning.  #6 myalgias Patient denies any current pain. Influenza PCR pending. Supportive care.  #7 insulin-dependent diabetes mellitus with nephropathy. Hemoglobin A1c was 6.6 on 11/24/2015.CBG this morning was 96.continue to hold Lantus for now. Sliding scale insulin. Once tolerating oral intake resume home dose Lantus.  #8 chronic diastolic heart failure Diuretics on hold. Beta blocker, Norvasc, Lasix on hold due to hypotension.follow. Monitor closely with hydration.  #9 gastroesophageal reflux disease Resume PPI.  #10 history of CVA/hypertension Patient off medications for the past 2 months and noted to be hypotensive on admission. Continue to hold Norvasc and Toprol, Lipitor. Continue Plavix.aspirin on hold.  #11 anemia of chronic kidney disease Follow H&H. Continue iron supplementation.  #12 encephalopathy Patient as she questions appropriately. Open his eyes to verbal stimuli. Likely at baseline.   DVT prophylaxis: heparin Code Status: full Family Communication: updated patient. No family present. Disposition Plan: back home to previous environment once hypotension has resolved and patient back to baseline.   Consultants:   none  Procedures:   Chest x-ray 01/27/2016  Antimicrobials:   IV Rocephin 01/28/2016   Subjective: Patient sleeping. Easily arousable. Patient denies any chest pain. No shortness of breath. No complaints.  Objective: Vitals:   01/28/16 0500 01/28/16 0503 01/28/16 0632 01/28/16 0800  BP:  102/62 122/67 (!) 86/54  Pulse:    67  Resp:  15 16 10   Temp: 97.8 F (36.6 C)   97.4 F (36.3 C)  TempSrc: Oral   Axillary  SpO2:    100%  Weight: 77.8 kg (171 lb 8.3 oz)     Height: 5\' 5"  (1.651 m)  Intake/Output Summary (Last 24 hours) at 01/28/16 0923 Last data filed at 01/28/16 0800  Gross per 24 hour  Intake             1780 ml  Output              150 ml  Net             1630 ml    Filed Weights   01/27/16 2157 01/28/16 0500  Weight: 83.9 kg (185 lb) 77.8 kg (171 lb 8.3 oz)    Examination:  General exam: Sleeping. Dry mucous membranes. Respiratory system: Clear to auscultation anterior lung fields. Respiratory effort normal. Cardiovascular system: S1 & S2 heard, RRR. No JVD, murmurs, rubs, gallops or clicks. No pedal edema. Gastrointestinal system: Abdomen is nondistended, soft and nontender. No organomegaly or masses felt. Normal bowel sounds heard. Central nervous system: Alert and oriented. No focal neurological deficits. Extremities: Symmetric 5 x 5 power. Skin: Stage 2 sacral decubitus ulcer. Psychiatry: Judgement and insight appear normal. Mood & affect appropriate.     Data Reviewed: I have personally reviewed following labs and imaging studies  CBC:  Recent Labs Lab 01/27/16 2351 01/28/16 0601  WBC 9.2 8.1  NEUTROABS 5.5  --   HGB 10.2* 9.2*  HCT 32.5* 29.1*  MCV 87.6 87.9  PLT PLATELET CLUMPS NOTED ON SMEAR, COUNT APPEARS ADEQUATE 229   Basic Metabolic Panel:  Recent Labs Lab 01/27/16 2351 01/28/16 0601  NA 139 140  K 5.3* 5.0  CL 110 112*  CO2 19* 19*  GLUCOSE 107* 107*  BUN 90* 85*  CREATININE 4.10* 3.95*  CALCIUM 8.9 8.5*   GFR: Estimated Creatinine Clearance: 14.8 mL/min (by C-G formula based on SCr of 3.95 mg/dL (H)). Liver Function Tests:  Recent Labs Lab 01/27/16 2351 01/28/16 0601  AST 15 12*  ALT 10* 11*  ALKPHOS 67 58  BILITOT 0.7 0.5  PROT 6.9 6.4*  ALBUMIN 3.5 3.2*   No results for input(s): LIPASE, AMYLASE in the last 168 hours. No results for input(s): AMMONIA in the last 168 hours. Coagulation Profile: No results for input(s): INR, PROTIME in the last 168 hours. Cardiac Enzymes:  Recent Labs Lab 01/27/16 2351  CKTOTAL 69   BNP (last 3 results) No results for input(s): PROBNP in the last 8760 hours. HbA1C: No results for input(s): HGBA1C in the last 72 hours. CBG:  Recent Labs Lab  01/28/16 0048 01/28/16 0750  GLUCAP 100* 96   Lipid Profile: No results for input(s): CHOL, HDL, LDLCALC, TRIG, CHOLHDL, LDLDIRECT in the last 72 hours. Thyroid Function Tests: No results for input(s): TSH, T4TOTAL, FREET4, T3FREE, THYROIDAB in the last 72 hours. Anemia Panel: No results for input(s): VITAMINB12, FOLATE, FERRITIN, TIBC, IRON, RETICCTPCT in the last 72 hours. Sepsis Labs:  Recent Labs Lab 01/28/16 0145  LATICACIDVEN 1.48    No results found for this or any previous visit (from the past 240 hour(s)).       Radiology Studies: Dg Chest Port 1 View  Result Date: 01/27/2016 CLINICAL DATA:  Hypotension EXAM: PORTABLE CHEST 1 VIEW COMPARISON:  12/27/2015 FINDINGS: The patient is markedly rotated. Right CP angle non included. Mild atelectasis at the left base. No consolidation or gross E fusion. Stable enlarged cardiomediastinal allowing for rotation. No pneumothorax. IMPRESSION: Mild streaky atelectasis at the left lung base. Otherwise no acute interval changes allowing for patient rotation. Electronically Signed   By: Donavan Foil M.D.   On: 01/27/2016 22:42  Scheduled Meds: . cefTRIAXone (ROCEPHIN)  IV  1 g Intravenous Q24H  . clopidogrel  75 mg Oral Daily  . ferrous sulfate  325 mg Oral Q breakfast  . heparin  5,000 Units Subcutaneous Q8H  . insulin aspart  0-5 Units Subcutaneous QHS  . insulin aspart  0-9 Units Subcutaneous TID WC  . sodium bicarbonate  650 mg Oral Daily  . sodium chloride  500 mL Intravenous Once  . sodium chloride flush  3 mL Intravenous Q12H   Continuous Infusions: . sodium chloride 100 mL/hr at 01/28/16 0800     LOS: 0 days    Time spent: 78 mins    Tida Saner, MD [facility] Pager [phone]  If 7PM-7AM, please contact night-coverage [email or web address] Password [facility] 01/28/2016, 9:23 AM

## 2016-01-28 NOTE — Progress Notes (Signed)
Initial Nutrition Assessment  DOCUMENTATION CODES:   Severe malnutrition in context of acute illness/injury  INTERVENTION:  - Will order Ensure Enlive once/day, this supplement provides 350 kcal and 20 grams of protein - Will order Boost Breeze BID, this supplement provides 250 kcal and 9 grams of protein - Will order daily multivitamin with minerals. - Continue to encourage PO intakes of meals and supplements.  - RD will continue to monitor for additional nutrition-related needs.  NUTRITION DIAGNOSIS:   Inadequate oral intake related to acute illness, vomiting, poor appetite as evidenced by per patient/family report.  GOAL:   Patient will meet greater than or equal to 90% of their needs  MONITOR:   PO intake, Supplement acceptance, Weight trends, Labs, Skin, I & O's  REASON FOR ASSESSMENT:   Malnutrition Screening Tool  ASSESSMENT:   67 y.o. female with a past medical history significant for hx of CVA with residual right hemiparesis, now bedbound, OSA on CPAP, CKD IV-V, baseline Cr 5.6-2, chronic diastolic CHF, and IDDM who presents with pain with turns. Caveat that all history is collected from family as patient is unable to provide her own history. Family relates that the patient was "aching all over today".  Family were trying to rotate the patient frequently off her bed sores, but today she kept calling out "ow ow ow" every time she was touched, and stated that she hurt "all over". She did have a loose stool today (01/28/16). She has been having emesis for a few weeks and because of this she has not been taking medications, not been eating, not been drinking.  Pt seen for MST. BMI indicates overweight status. Pt sleeping soundly at time of visit and no family/visitors present. Flow sheet indicates pt is alert and oriented to self only this AM. Spoke with RN during rounds who reports that pt drank orange juice this AM but refused breakfast/any other PO intakes. Notes indicate that  d/t stroke with residual R-sided hemiparesis, pt is mainly bedbound.   Unable to perform physical assessment with respect to pt's comfort while sleeping; no visible muscle or fat wasting when sheets pulled back. Per chart review, pt has lost 14 lbs (7.6% body weight) since admission on 01/15/16. She also lost 41 lbs (18% body weight) from 09/26/15-12/11/15. Both weight decreases are significant for time frame. Pt meets criteria for malnutrition based on weight loss (will monitor weight closely d/t hypovolemia with IVF ordered) and suspected <50% intakes for >/= 5 days based on RN report this AM and notes from family information.   Medications reviewed; 325 mg oral ferrous sulfate/day, sliding scale Novolog, 40 mg IV Protonix/day, 650 mg oral sodium bicarb/day. Labs reviewed; CBGs: 96 and 100 mg/dL, Cl: 112 mmol/L, BUN: 85 mg/dL, creatinine: 3.95 mg/dL, Ca: 8.5 mg/dL, AST/ALT low, GFR: 13 mL/min.  IVF: NS @ 100 mL/hr.      Diet Order:  Diet renal with fluid restriction Fluid restriction: 1200 mL Fluid; Room service appropriate? Yes; Fluid consistency: Thin  Skin:  Wound (see comment) (Stage 2 buttocks and labia pressure injuries; Notes indicate stage 3 sacral pressure injury)  Last BM:  PTA  Height:   Ht Readings from Last 1 Encounters:  01/28/16 5\' 5"  (1.651 m)    Weight:   Wt Readings from Last 1 Encounters:  01/28/16 171 lb 8.3 oz (77.8 kg)    Ideal Body Weight:  56.82 kg  BMI:  Body mass index is 28.54 kg/m.  Estimated Nutritional Needs:   Kcal:  1308-6578 (  22-25 kcal/kg)  Protein:  78-93 grams (1-1.2 grams/kg)  Fluid:  1.8-2 L/day  EDUCATION NEEDS:   No education needs identified at this time    Jarome Matin, MS, RD, LDN, CNSC Inpatient Clinical Dietitian Pager # [phone] After hours/weekend pager # [phone]

## 2016-01-28 NOTE — Consult Note (Signed)
Consultation Note Date: 01/28/2016   Patient Name: Michelle Holmes  DOB: [birth year]  MRN: [id number]  Age / Sex: 66 y.o., female  PCP: Ricke Hey, MD Referring Physician: Eugenie Filler, MD  Reason for Consultation: Disposition, Establishing goals of care, Interfamily conflict and Psychosocial/spiritual support  HPI/Patient Profile: 66 y.o. female  with past medical history of hx of CVA with residual right hemiparesis, now bedbound, OSA on CPAP, CKD IV-V, baseline Cr 6.3-8, chronic diastolic CHF, IDDM, and hx of colon cancer admitted on 01/27/2016 with generalized pain. Family also reports ongoing vomiting for the past two months. In that time Michelle Holmes has had progressively poor intake and has been refusing medications. On admission she was found to be hypotensive, likely secondary to hypovolemia from dehydration. Also concern for a UTI, and now on antibiotics.  Clinical Assessment and Goals of Care: Michelle Holmes was pleasant and conversational on my arrival. She answered my questions consistently, indicating that she was comfortable at rest but had back pain with movement. She denied abdominal pain and nausea. No vomiting for the past three days. Of note, while she did answer my questions consistently, she did demonstrate some mild confusion. She was slow to respond, withdrawn, and couldn't tell me the date, situation, or location. With her permission, I met with her family outside of the room.   Three of her seven children were present, along with three of her grandchildren. Per their report, she is divorced from all three of her ex-husbands. When asked, the patient wanted her daughter Michelle Holmes to make decisions for her if she was unable. Michelle Holmes, however, are angry about this and contest their mother's ability to make that decision.   In talking with her family, they expressed a two month decline. Since the onset of her vomiting (occurs ~30 min after  eating with no prior nausea), her oral intake has dramatically decreased, and she has refused to take her medications. They are concerned that her decline is from both the vomiting (and fear of vomiting) and progressive depression from her debility and prolonged health issues. She has had multiple presentations to the ED and admissions for similar issues--pain, vomiting, etc.--yet they feel they have no clarity on the underlying cause. They are extremely frustrated by her decline and the lack of useful interventions despite hospital presentations.   Primary Decision Maker OTHER; the patient is able to offer preferences and appears to be decisional. Will work to discuss things with both her and her family to reach consensus. If there is conflict, will get a formal capacity evaluation.   SUMMARY OF RECOMMENDATIONS    Long discussion with primary team, plan to consult GI for formal evaluation of prolonged vomiting, and Psych for depression concerns. Last CT abd/pelvis done 12/20 also indicated concern for bowel obstructions, will get repeat abd x-ray to re-evaluate.   Family to bring in copy of pt's advanced directives  Repeat family meeting on 1/3 at 22 to review advanced directives and discuss work-up thus far  Code Status/Advance Care Planning:  Full code  Symptom Management:   Nausea: PRN Zofran  Pain: PRN dilaudid (avoid morphine given renal function)  Sleep/Mood/Appetite: Zyprexa qHS  Palliative Prophylaxis:   Bowel Regimen, Frequent Pain Assessment, Palliative Wound Care and Turn Reposition  Additional Recommendations (Limitations, Scope, Preferences):  Full Scope Treatment  Psycho-social/Spiritual:   Desire for further Chaplaincy support:no  Additional Recommendations: TBD  Prognosis:   Unable to determine  Discharge Planning: To Be Determined  Primary Diagnoses: Present on Admission: . Uncontrolled type 2 DM with hyperosmolar nonketotic hyperglycemia  ([place]) . OSA on CPAP . Morbid obesity ([place]) . Anemia in other chronic diseases classified elsewhere . Chronic kidney disease (CKD), stage IV (severe) ([place]) . Chronic diastolic CHF (congestive heart failure) ([place]) . Hypotension . Decubitus ulcer of sacral region, stage 2 . Urinary tract infection without hematuria . Dehydration   I have reviewed the medical record, interviewed the patient and family, and examined the patient. The following aspects are pertinent.  Past Medical History:  Diagnosis Date  . Anemia   . CHF (congestive heart failure) ([place])   . Chronic back pain   . COPD (chronic obstructive pulmonary disease) ([place])   . Coronary artery disease   . Diabetes mellitus   . DJD (degenerative joint disease)   . Emphysema   . Hypercholesteremia   . Hypertension   . Myocardial infarction   . Obesity   . Obesity hypoventilation syndrome ([place])   . Renal insufficiency   . Scoliosis   . Sleep apnea   . Stroke ([place])   . TIA (transient ischemic attack)    Social History   Social History  . Marital status: Divorced    Spouse name: N/A  . Number of children: 5  . Years of education: N/A   Social History Main Topics  . Smoking status: Never Smoker  . Smokeless tobacco: Never Used  . Alcohol use No  . Drug use: No  . Sexual activity: Not Asked   Other Topics Concern  . None   Social History Narrative  . None   Family History  Problem Relation Age of Onset  . Diabetes Mother   . Stroke Maternal Aunt    Scheduled Meds: . cefTRIAXone (ROCEPHIN)  IV  1 g Intravenous Q24H  . clopidogrel  75 mg Oral Daily  . feeding supplement  1 Container Oral BID BM  . feeding supplement (ENSURE ENLIVE)  237 mL Oral Q24H  . ferrous sulfate  325 mg Oral Q breakfast  . [START ON 01/29/2016] Influenza vac split quadrivalent PF  0.5 mL Intramuscular Tomorrow-1000  . insulin aspart  0-5 Units Subcutaneous QHS  . insulin aspart  0-9 Units Subcutaneous TID WC  . multivitamin with  minerals  1 tablet Oral Daily  . pantoprazole (PROTONIX) IV  40 mg Intravenous Q24H  . [START ON 01/29/2016] pneumococcal 23 valent vaccine  0.5 mL Intramuscular Tomorrow-1000  . sodium bicarbonate  650 mg Oral Daily  . sodium chloride flush  3 mL Intravenous Q12H  . sorbitol, milk of mag, mineral oil, glycerin (SMOG) enema  960 mL Rectal Once   Continuous Infusions: . heparin 1,100 Units/hr (01/28/16 1501)   PRN Meds:.acetaminophen **OR** acetaminophen, morphine injection Allergies  Allergen Reactions  . Penicillins Hives and Swelling    Has patient had a PCN reaction causing immediate rash, facial/tongue/throat swelling, SOB or lightheadedness with hypotension: yes Has patient had a PCN reaction causing severe rash involving mucus membranes or skin necrosis: no Has patient had a PCN reaction that required hospitalization no Has patient had a PCN reaction occurring within the last 10 years: no If all of the above answers are "NO", then may proceed with Cephalosporin use.    Review of Systems  Constitutional: Positive for activity change, appetite change and fatigue.  HENT: Negative for congestion, hearing loss, sore throat and trouble swallowing.   Eyes: Negative for visual disturbance.  Respiratory: Negative for chest tightness and shortness of breath.  Cardiovascular: Negative for chest pain.  Gastrointestinal: Positive for abdominal pain, diarrhea and vomiting. Negative for abdominal distention, constipation and nausea.  Musculoskeletal: Positive for back pain and myalgias.  Neurological: Positive for weakness. Negative for speech difficulty and headaches.  Psychiatric/Behavioral: Positive for confusion and decreased concentration. Negative for behavioral problems and sleep disturbance. The patient is not nervous/anxious.    Physical Exam  Constitutional: She appears well-developed and well-nourished. No distress.  HENT:  Head: Normocephalic and atraumatic.  Mouth/Throat:  Oropharynx is clear and moist. Mucous membranes are dry.  Eyes: EOM are normal.  Neck: Normal range of motion.  Cardiovascular: Normal rate.   Pulmonary/Chest: Effort normal.  Abdominal: Soft. She exhibits no distension. There is no tenderness.  Musculoskeletal: She exhibits no edema.  Unable to move right side, prior stroke  Neurological: She is alert.  Oriented to person, confused on place, time, and situation. Very conversational otherwise.   Skin: Skin is dry. There is pallor.  Psychiatric: She has a normal mood and affect. Judgment and thought content normal. Her speech is delayed. She is slowed and withdrawn. She exhibits abnormal recent memory and abnormal remote memory.    Vital Signs: BP (!) 116/54   Pulse 67   Temp (!) 96.7 F (35.9 C) (Axillary)   Resp 11   Ht 5' 5"  (1.651 m)   Wt 77.8 kg (171 lb 8.3 oz)   SpO2 100%   BMI 28.54 kg/m  Pain Assessment: Faces   Pain Score: 0-No pain   SpO2: SpO2: 100 % O2 Device:SpO2: 100 % O2 Flow Rate: .   IO: Intake/output summary:  Intake/Output Summary (Last 24 hours) at 01/28/16 1642 Last data filed at 01/28/16 1501  Gross per 24 hour  Intake          2921.67 ml  Output              450 ml  Net          2471.67 ml    LBM:   Baseline Weight: Weight: 83.9 kg (185 lb) Most recent weight: Weight: 77.8 kg (171 lb 8.3 oz)     Palliative Assessment/Data: PPS 50%    Time In: 1530 Time Out: 1715 Time Total: 105 minutes Greater than 50%  of this time was spent counseling and coordinating care related to the above assessment and plan.  Signed by: Charlynn Court, NP Palliative Medicine Team Pager # [phone] (M-F 7a-5p) Team Phone # [phone] (Nights/Weekends)

## 2016-01-28 NOTE — Progress Notes (Signed)
Pt refused all PO meds and enema. Educated on reason enema needed, still refused it. Also refused to be repositioned off of her pressure ulcers.   Will continue to monitor.

## 2016-01-28 NOTE — Progress Notes (Signed)
Vascular ultrasound report positive clots in left leg called to Dr Grandville Silos.

## 2016-01-28 NOTE — Progress Notes (Signed)
PT Cancellation Note  Patient Details Name: CAMDYN LADEN MRN: [id number] DOB: 1950/01/29   Cancelled Treatment:    Reason Eval/Treat Not Completed: PT screened, no needs identified, will sign off. Familiar with pt from previous admission. Pt is total care at baseline. Per chart, she is reported to be bedbound as well.  No acute PT needs. Will sign off.   Weston Anna, MPT Pager: [phone]

## 2016-01-28 NOTE — ED Notes (Signed)
Family requested that  Mother be let to relax before trying to place USIV.  PA informed

## 2016-01-28 NOTE — Progress Notes (Signed)
ANTICOAGULATION CONSULT NOTE - Initial Consult  Pharmacy Consult for Heparin Indication: DVT  Allergies  Allergen Reactions  . Penicillins Hives and Swelling    Has patient had a PCN reaction causing immediate rash, facial/tongue/throat swelling, SOB or lightheadedness with hypotension: yes Has patient had a PCN reaction causing severe rash involving mucus membranes or skin necrosis: no Has patient had a PCN reaction that required hospitalization no Has patient had a PCN reaction occurring within the last 10 years: no If all of the above answers are "NO", then may proceed with Cephalosporin use.     Patient Measurements: Height: 5\' 5"  (165.1 cm) Weight: 171 lb 8.3 oz (77.8 kg) IBW/kg (Calculated) : 57 Heparin Dosing Weight: 73.2 kg  Vital Signs: Temp: 97.4 F (36.3 C) (01/02 0800) Temp Source: Axillary (01/02 0800) BP: 110/60 (01/02 1100) Pulse Rate: 67 (01/02 0800)  Labs:  Recent Labs  01/27/16 2351 01/28/16 0601  HGB 10.2* 9.2*  HCT 32.5* 29.1*  PLT PLATELET CLUMPS NOTED ON SMEAR, COUNT APPEARS ADEQUATE 178  CREATININE 4.10* 3.95*  CKTOTAL 69  --     Estimated Creatinine Clearance: 14.8 mL/min (by C-G formula based on SCr of 3.95 mg/dL (H)).   Medical History: Past Medical History:  Diagnosis Date  . Anemia   . CHF (congestive heart failure) ([place])   . Chronic back pain   . COPD (chronic obstructive pulmonary disease) ([place])   . Coronary artery disease   . Diabetes mellitus   . DJD (degenerative joint disease)   . Emphysema   . Hypercholesteremia   . Hypertension   . Myocardial infarction   . Obesity   . Obesity hypoventilation syndrome ([place])   . Renal insufficiency   . Scoliosis   . Sleep apnea   . Stroke ([place])   . TIA (transient ischemic attack)     Medications:  Scheduled:  . cefTRIAXone (ROCEPHIN)  IV  1 g Intravenous Q24H  . clopidogrel  75 mg Oral Daily  . feeding supplement  1 Container Oral BID BM  . feeding supplement (ENSURE ENLIVE)   237 mL Oral Q24H  . ferrous sulfate  325 mg Oral Q breakfast  . heparin  5,000 Units Subcutaneous Q8H  . [START ON 01/29/2016] Influenza vac split quadrivalent PF  0.5 mL Intramuscular Tomorrow-1000  . insulin aspart  0-5 Units Subcutaneous QHS  . insulin aspart  0-9 Units Subcutaneous TID WC  . multivitamin with minerals  1 tablet Oral Daily  . pantoprazole (PROTONIX) IV  40 mg Intravenous Q24H  . [START ON 01/29/2016] pneumococcal 23 valent vaccine  0.5 mL Intramuscular Tomorrow-1000  . sodium bicarbonate  650 mg Oral Daily  . sodium chloride flush  3 mL Intravenous Q12H   Infusions:  . sodium chloride 100 mL/hr at 01/28/16 1000   PRN: acetaminophen **OR** acetaminophen, morphine injection  Assessment: 66 year old female currently bedbound from sequela is of stroke 7 bedsores who was brought to the ED per family with complaints of pain in repositioning in bed in pain all over and found to be hypotensive.  Left lower extremity is positive for a small segment of nonocclusive, mobile? thrombosis in the left common femoral vein.  Pharmacy is consulted to dose IV heparin. Received a dose of sq heparin at 06:30 this AM, therefore, will give a reduced dose bolus.  Baseline PTT, PT/INR pending CBC: Hgb low at 9.2, Plts low 178 CKD: SCr 3.95, CrCl ~15 ml/min No bleeding currently noted Concurrent plavix noted, PTA aspirin on hold  Goal of Therapy:  Heparin level 0.3-0.7 units/ml Monitor platelets by anticoagulation protocol: Yes   Plan:    Heparin 1500 units IV bolus, then  Heparin IV infusion 1100 units/hr  Check heparin level 8hr after heparin starts  Daily heparin level and CBC  Peggyann Juba, PharmD, BCPS Pager: [phone] 01/28/2016,11:48 AM

## 2016-01-28 NOTE — Progress Notes (Signed)
Pharmacy Antibiotic Note  Michelle Holmes is a 66 y.o. female c/o aching all over admitted on 01/27/2016 with UTI.  Pharmacy has been consulted for rocephin dosing.  Plan: Rocephin 1Gm IV q24h  Height: 5\' 5"  (165.1 cm) Weight: 171 lb 8.3 oz (77.8 kg) IBW/kg (Calculated) : 57  Temp (24hrs), Avg:98.6 F (37 C), Min:97.8 F (36.6 C), Max:99.7 F (37.6 C)   Recent Labs Lab 01/27/16 2351 01/28/16 0145  WBC 9.2  --   CREATININE 4.10*  --   LATICACIDVEN  --  1.48    Estimated Creatinine Clearance: 14.3 mL/min (by C-G formula based on SCr of 4.1 mg/dL (H)).    Allergies  Allergen Reactions  . Penicillins Hives and Swelling    Has patient had a PCN reaction causing immediate rash, facial/tongue/throat swelling, SOB or lightheadedness with hypotension: yes Has patient had a PCN reaction causing severe rash involving mucus membranes or skin necrosis: no Has patient had a PCN reaction that required hospitalization no Has patient had a PCN reaction occurring within the last 10 years: no If all of the above answers are "NO", then may proceed with Cephalosporin use.     Antimicrobials this admission: 1/2 rocephin >>    >>   Dose adjustments this admission:   Microbiology results:  BCx:   UCx:    Sputum:    MRSA PCR:   Thank you for allowing pharmacy to be a part of this patient's care. Rx will sign off as no further dosing adjustments are needed.   Dorrene German 01/28/2016 5:21 AM

## 2016-01-28 NOTE — Progress Notes (Signed)
*  PRELIMINARY RESULTS* Vascular Ultrasound Bilateral lower extremity venous duplex has been completed.  Preliminary findings: The visualized veins of the right lower extremity appear negative for deep vein thrombosis.  The left lower extremity is positive for a small segment of nonocclusive, mobile? thrombosis in the left common femoral vein. The left iliac vein and other visualized segments of the left lower extremity appear negative for deep vein thrombosis.  Preliminary results given to RN, Baker Janus.  Everrett Coombe 01/28/2016, 11:33 AM

## 2016-01-28 NOTE — ED Notes (Addendum)
Junie Panning (Daughter) [phone]

## 2016-01-28 NOTE — ED Notes (Signed)
Michelle Holmes [phone] Password: [place]

## 2016-01-28 NOTE — Progress Notes (Signed)
Patient turned in bed,c/o severe right leg pain, Refuses tylenol. Dr Grandville Silos notifed ,orders received

## 2016-01-28 NOTE — H&P (Signed)
History and Physical  Patient Name: Michelle Holmes     QQV:[id number]    DOB: [birth year]    DOA: 01/27/2016 PCP: Ricke Hey, MD   Patient coming from: Home  Chief Complaint: Discomfort with turns  HPI: Michelle Holmes is a 66 y.o. female with a past medical history significant for hx of CVA with residual right hemiparesis, now bedbound, OSA on CPAP, CKD IV-V, baseline Cr 3.3-2, chronic diastolic CHF, and IDDM who presents with pain with turns.  Caveat that all history is collected from family as patient is unable to provide her own history.    Family relate that the patient was "aching all over today".  Family were trying to rotate the patient frequently off her bed sores, but today she kept calling out "ow ow ow" every time she was touched, and stated that she hurt "all over".  She did have a loose stool today.  She has been having emesis for a few weeks.  Because of emesis, she has not been taking medications, not been eating, not been drinking.  No noted fever, chills, sweats.  No noted cough, rhinorrhea.  ED course: -Afebrile, heart rate 81, respirations and pulse is normal, blood pressure 82/66 -Na 139, K 5.3, Cr 4.1 (baseline 2.7 as recently as Nov., more recently 3-4), WBC 9.2K, Hgb 10.2 -UA showed leukocytes -CXR clear -CK normal -Lactic acid 1.48 -There was low suspicion for UTI sepsis, and she did not strictly meet SIRS criteria -She was given ceftriaxone for possible UTI, as well as 2L NS and BP did not improve and so TRH were asked to observe given hypotension    It appears the patient was relatively functional until this year.  She had a history of TIAs going back to at least 2014 characterized by weakness on the right side, but that it wasn't until Dec 2016 that she was admitted with MRI confirmed stroke, and at that time was discharged to inpatient rehab for her right sided weakness.  Family are vague about how functional she has been since then, but it sounds as if she was  still ambulatory until about two months ago, when she became no longer ambulatory, although family are very vague about when she was able to do what.    Most recently, she had two hospitalizations in Oct and Nov 2017, first for acute encephalopathy and the second time for AKI on CKD in the setting of diarrhea and vomiting.  Today, she has some degree of encephalopathy and some degree of vomiting diarrhea, per report.      ROS: ROS        Past Medical History:  Diagnosis Date  . Anemia   . CHF (congestive heart failure) ([place])   . Chronic back pain   . COPD (chronic obstructive pulmonary disease) ([place])   . Coronary artery disease   . Diabetes mellitus   . DJD (degenerative joint disease)   . Emphysema   . Hypercholesteremia   . Hypertension   . Myocardial infarction   . Obesity   . Obesity hypoventilation syndrome ([place])   . Renal insufficiency   . Scoliosis   . Sleep apnea   . Stroke ([place])   . TIA (transient ischemic attack)     Past Surgical History:  Procedure Laterality Date  . COLON SURGERY    . CORONARY ANGIOPLASTY WITH STENT PLACEMENT      Social History: Patient lives with family.  The patient is now bedbound.  She is not  a smoker.  Allergies  Allergen Reactions  . Penicillins Hives and Swelling    Has patient had a PCN reaction causing immediate rash, facial/tongue/throat swelling, SOB or lightheadedness with hypotension: yes Has patient had a PCN reaction causing severe rash involving mucus membranes or skin necrosis: no Has patient had a PCN reaction that required hospitalization no Has patient had a PCN reaction occurring within the last 10 years: no If all of the above answers are "NO", then may proceed with Cephalosporin use.     Family history: family history includes Diabetes in her mother; Stroke in her maternal aunt.  Prior to Admission medications   Medication Sig Start Date End Date Taking? Authorizing Provider  amLODipine (NORVASC) 5 MG  tablet Take 5 mg by mouth at bedtime.    Historical Provider, MD  atorvastatin (LIPITOR) 80 MG tablet Take 1 tablet (80 mg total) by mouth daily at 6 PM. Patient taking differently: Take 40 mg by mouth daily at 6 PM.  07/07/15   Mauricio Gerome Apley, MD  clopidogrel (PLAVIX) 75 MG tablet Take 1 tablet (75 mg total) by mouth daily. 02/07/15   Lavon Paganini Angiulli, PA-C  ferrous sulfate 325 (65 FE) MG tablet Take 1 tablet (325 mg total) by mouth daily with breakfast. 11/29/15   Florencia Reasons, MD  furosemide (LASIX) 20 MG tablet Take 1 tablet (20 mg total) by mouth every Monday, Wednesday, and Friday. Please start first dose on Wednesday 11/22 12/18/15   Albertine Patricia, MD  HYDROcodone-acetaminophen (NORCO/VICODIN) 5-325 MG tablet Take 1 tablet by mouth every 4 (four) hours as needed for severe pain. 09/26/15   [name], MD  Insulin Glargine (LANTUS SOLOSTAR) 100 UNIT/ML Solostar Pen Inject 10 Units into the skin at bedtime. 12/14/15   Silver Huguenin Elgergawy, MD  LYRICA 50 MG capsule Take 1 capsule (50 mg total) by mouth daily. Patient taking differently: Take 50 mg by mouth 2 (two) times daily.  07/14/15   Silver Huguenin Elgergawy, MD  metoprolol succinate (TOPROL-XL) 50 MG 24 hr tablet Take 50 mg by mouth daily. Take with or immediately following a meal.    Historical Provider, MD  ondansetron (ZOFRAN) 4 MG tablet Take 1 tablet (4 mg total) by mouth every 6 (six) hours as needed for nausea or vomiting. 01/17/16   Levin Erp, PA  Oxycodone HCl 20 MG TABS Take 20 mg by mouth 3 (three) times daily as needed for pain. 11/21/15   Historical Provider, MD  pantoprazole (PROTONIX) 40 MG tablet Take 40 mg by mouth daily. 09/17/15   Historical Provider, MD  polyethylene glycol powder (MIRALAX) powder Take 17 g by mouth daily. 01/15/16   Jola Schmidt, MD  senna-docusate (SENOKOT-S) 8.6-50 MG tablet Take 1 tablet by mouth at bedtime. Do not take if diarrhea 11/29/15   Florencia Reasons, MD  sodium bicarbonate 650 MG tablet Take  1 tablet (650 mg total) by mouth daily. 11/29/15   Florencia Reasons, MD  traMADol (ULTRAM) 50 MG tablet take 1 TABLET BY MOUTH EVERY 12 hours 12/30/15   Charlett Blake, MD       Physical Exam: BP 91/56   Pulse 72   Temp 99.7 F (37.6 C) (Rectal)   Resp 12   Ht 5\' 5"  (1.651 m)   Wt 83.9 kg (185 lb)   SpO2 99%   BMI 30.79 kg/m  General appearance: Well-developed, obese adult female, rousable, does not answer questions, irritable to touch.  No obvious distress.   Eyes:  Anicteric, conjunctiva pink, lids and lashes normal. PERRL.    ENT: No nasal deformity, discharge, epistaxis.  Hearing unable to assess. Will not open mouth, lips dry.   Neck: No neck masses.  Trachea midline.  No thyromegaly/tenderness. Lymph: No cervical or supraclavicular lymphadenopathy. Skin: Warm and dry.  No jaundice.  No suspicious rashes or lesions.  Pressure ulcers with dressings intact, no drainage, or surrounding rednesss, induration or erythema. Cardiac: RRR, nl S1-S2, soft SEM appreciated.  Capillary refill is brisk.  No JVD.  No LE edema.  Radial and DP pulses 2+ and symmetric. Respiratory: Normal respiratory rate and rhythm.  CTAB without rales or wheezes. Abdomen: Abdomen soft.  No TTP. No ascites, distension, hepatosplenomegaly.   MSK: No deformities or effusions.  No cyanosis or clubbing. Neuro: Awake but does not answer questions, follow commands.  Speaks fluently to say "leave me alone"    Psych: Unable to assess.    Labs on Admission:  I have personally reviewed following labs and imaging studies: CBC:  Recent Labs Lab 01/27/16 2351  WBC 9.2  NEUTROABS 5.5  HGB 10.2*  HCT 32.5*  MCV 87.6  PLT PLATELET CLUMPS NOTED ON SMEAR, COUNT APPEARS ADEQUATE   Basic Metabolic Panel:  Recent Labs Lab 01/27/16 2351  NA 139  K 5.3*  CL 110  CO2 19*  GLUCOSE 107*  BUN 90*  CREATININE 4.10*  CALCIUM 8.9   GFR: Estimated Creatinine Clearance: 14.8 mL/min (by C-G formula based on SCr of 4.1 mg/dL  (H)).  Liver Function Tests:  Recent Labs Lab 01/27/16 2351  AST 15  ALT 10*  ALKPHOS 67  BILITOT 0.7  PROT 6.9  ALBUMIN 3.5   No results for input(s): LIPASE, AMYLASE in the last 168 hours. No results for input(s): AMMONIA in the last 168 hours. Coagulation Profile: No results for input(s): INR, PROTIME in the last 168 hours. Cardiac Enzymes:  Recent Labs Lab 01/27/16 2351  CKTOTAL 69   BNP (last 3 results) No results for input(s): PROBNP in the last 8760 hours. HbA1C: No results for input(s): HGBA1C in the last 72 hours. CBG:  Recent Labs Lab 01/28/16 0048  GLUCAP 100*   Lipid Profile: No results for input(s): CHOL, HDL, LDLCALC, TRIG, CHOLHDL, LDLDIRECT in the last 72 hours. Thyroid Function Tests: No results for input(s): TSH, T4TOTAL, FREET4, T3FREE, THYROIDAB in the last 72 hours. Anemia Panel: No results for input(s): VITAMINB12, FOLATE, FERRITIN, TIBC, IRON, RETICCTPCT in the last 72 hours. Sepsis Labs: Lactic acid 1.48 Invalid input(s): PROCALCITONIN, LACTICIDVEN No results found for this or any previous visit (from the past 240 hour(s)).       Radiological Exams on Admission: Personally reviewed: Dg Chest Port 1 View  Result Date: 01/27/2016 CLINICAL DATA:  Hypotension EXAM: PORTABLE CHEST 1 VIEW COMPARISON:  12/27/2015 FINDINGS: The patient is markedly rotated. Right CP angle non included. Mild atelectasis at the left base. No consolidation or gross E fusion. Stable enlarged cardiomediastinal allowing for rotation. No pneumothorax. IMPRESSION: Mild streaky atelectasis at the left lung base. Otherwise no acute interval changes allowing for patient rotation. Electronically Signed   By: Donavan Foil M.D.   On: 01/27/2016 22:42        Assessment/Plan This is a chronically ill 66 year old female who is now bedbound from sequela of strokes with bed sores.  Family bring her to the ER for complaints of pain with repositioning in bed and "pain all over'  and she is found incidentally to be hypotensive  but non-toxic.  This does not appear to be septic shock clinically.  The history suggests a prolonged period of poor intake and diarrhea/vomiting, I.e hypovolemia.      With regard to myalgias, given patient's inability to corroborate history, assume that complaint of "pain all over" is myalgias.  No focal pain on exam.  Pressure ulcers have no surrounding redness or drainage.  Unclear if the patient's complaints of pain represent pain or just irritability being touched.  With respect to her mental status, it is unclear if this is an acute or chronic encephalopathy, family had no concerns about her mental status at the time fo presentation and made no such concerns known to me on the phone.     1. Hypotension:  -Admission reasonable for further evaluation -Will place on IVF gentle hydration and reassess in AM -Will treat UTI empirically with ceftriaxone -PT eval prior to discharge    2. Myalgias: -Check flu swab  3. Chronic kidney disease: Suspected baseline now 3-4.   -Hold Lasix on home med list -Gentle hydration and repeat BMP  4. IDDM with nephropathy: -Hold glargine for now -SSI with meals  5. UTI -Ceftriaxone -Follow urine culture  6. HTN and stroke secondary prevention: Off meds "for 2 months" and hypotensive at admission.   -Hold Norvasc, Metoprolol  -Continue Plavix -Stop aspirin -Hold Lipitor until more alert  7. Anemia:  Anemia of renal disease, suspected. -Continue iron  8. Other medications:  -Hold Lyrica or opioids until more alert  9. Chronic diastolic CHF:  EF 41-32%.  Appears dehydrated clinically.  10. Encephalopathy:  -Fluids and monitor mental status       DVT prophylaxis: Heparin  Code Status: FULL  Family Communication: Daughters by phone.    Disposition Plan: Anticipate IV fluids and re-evaluate in AM. Consults called: None Admission status: OBS, stepdown At the point of initial  evaluation, it is my clinical opinion that admission for OBSERVATION is reasonable and necessary because the patient's presenting complaints in the context of their chronic conditions represent sufficient risk of deterioration or significant morbidity to constitute reasonable grounds for close observation in the hospital setting, but that the patient may be medically stable for discharge from the hospital within 24 to 48 hours.    Medical decision making: Patient seen at 3:46 AM on 01/28/2016.  The patient was discussed with Antonietta Breach, PA-C.  What exists of the patient's chart was reviewed in depth and summarized above.  Clinical condition: stable.        Edwin Dada [facility] Pager [phone]

## 2016-01-28 NOTE — ED Notes (Signed)
Attempted to draw blood for venous blood gas and I-stat lactic acid. Unable to draw blood back at this time.

## 2016-01-29 DIAGNOSIS — N179 Acute kidney failure, unspecified: Principal | ICD-10-CM

## 2016-01-29 DIAGNOSIS — G43A0 Cyclical vomiting, not intractable: Secondary | ICD-10-CM

## 2016-01-29 DIAGNOSIS — L89152 Pressure ulcer of sacral region, stage 2: Secondary | ICD-10-CM

## 2016-01-29 DIAGNOSIS — K5900 Constipation, unspecified: Secondary | ICD-10-CM

## 2016-01-29 LAB — CBC
HCT: 26.7 % — ABNORMAL LOW (ref 36.0–46.0)
Hemoglobin: 8.5 g/dL — ABNORMAL LOW (ref 12.0–15.0)
MCH: 28.5 pg (ref 26.0–34.0)
MCHC: 31.8 g/dL (ref 30.0–36.0)
MCV: 89.6 fL (ref 78.0–100.0)
Platelets: 160 10*3/uL (ref 150–400)
RBC: 2.98 MIL/uL — ABNORMAL LOW (ref 3.87–5.11)
RDW: 16.1 % — ABNORMAL HIGH (ref 11.5–15.5)
WBC: 6.1 10*3/uL (ref 4.0–10.5)

## 2016-01-29 LAB — RENAL FUNCTION PANEL
Albumin: 2.9 g/dL — ABNORMAL LOW (ref 3.5–5.0)
Anion gap: 8 (ref 5–15)
BUN: 76 mg/dL — ABNORMAL HIGH (ref 6–20)
CO2: 18 mmol/L — ABNORMAL LOW (ref 22–32)
Calcium: 8.6 mg/dL — ABNORMAL LOW (ref 8.9–10.3)
Chloride: 116 mmol/L — ABNORMAL HIGH (ref 101–111)
Creatinine, Ser: 3.29 mg/dL — ABNORMAL HIGH (ref 0.44–1.00)
GFR calc Af Amer: 16 mL/min — ABNORMAL LOW (ref >60)
GFR calc non Af Amer: 14 mL/min — ABNORMAL LOW (ref >60)
Glucose, Bld: 97 mg/dL (ref 65–99)
Phosphorus: 3.6 mg/dL (ref 2.5–4.6)
Potassium: 4.6 mmol/L (ref 3.5–5.1)
Sodium: 142 mmol/L (ref 135–145)

## 2016-01-29 LAB — URINE CULTURE

## 2016-01-29 LAB — HEPARIN LEVEL (UNFRACTIONATED)
Heparin Unfractionated: <0.1 IU/mL — ABNORMAL LOW (ref 0.30–0.70)
Heparin Unfractionated: <0.1 IU/mL — ABNORMAL LOW (ref 0.30–0.70)

## 2016-01-29 LAB — GLUCOSE, CAPILLARY
Glucose-Capillary: 83 mg/dL (ref 65–99)
Glucose-Capillary: 152 mg/dL — ABNORMAL HIGH (ref 65–99)

## 2016-01-29 MED ORDER — POLYETHYLENE GLYCOL 3350 17 G PO PACK
17.0000 g | PACK | Freq: Two times a day (BID) | ORAL | Status: DC
  Administered 2016-01-29: 17 g via ORAL
  Filled 2016-01-29: qty 1

## 2016-01-29 MED ORDER — BISACODYL 10 MG RE SUPP: 10.0000 mg | Freq: Every day | RECTAL | Status: DC | PRN

## 2016-01-29 MED ORDER — ACETAMINOPHEN 650 MG RE SUPP: 650.0000 mg | Freq: Four times a day (QID) | RECTAL | Status: DC | PRN

## 2016-01-29 MED ORDER — HEPARIN BOLUS VIA INFUSION
2000.0000 [IU] | Freq: Once | INTRAVENOUS | Status: AC
  Administered 2016-01-29: 2000 [IU] via INTRAVENOUS
  Filled 2016-01-29: qty 2000

## 2016-01-29 MED ORDER — SODIUM CHLORIDE 0.9 % IV SOLN: INTRAVENOUS | Status: AC

## 2016-01-29 MED ORDER — INSULIN ASPART 100 UNIT/ML ~~LOC~~ SOLN: 0.0000 [IU] | Freq: Every day | SUBCUTANEOUS | Status: DC

## 2016-01-29 MED ORDER — POLYETHYLENE GLYCOL 3350 17 G PO PACK
17.0000 g | PACK | Freq: Two times a day (BID) | ORAL | Status: DC
  Filled 2016-01-29 (×4): qty 1

## 2016-01-29 MED ORDER — ATORVASTATIN CALCIUM 40 MG PO TABS: 40.0000 mg | ORAL_TABLET | Freq: Every day | ORAL | Status: DC

## 2016-01-29 MED ORDER — ONDANSETRON 4 MG PO TBDP
4.0000 mg | ORAL_TABLET | Freq: Four times a day (QID) | ORAL | Status: DC | PRN
  Administered 2016-01-31: 4 mg via ORAL
  Filled 2016-01-29: qty 1

## 2016-01-29 MED ORDER — ENOXAPARIN SODIUM 80 MG/0.8ML ~~LOC~~ SOLN
80.0000 mg | SUBCUTANEOUS | Status: DC
  Administered 2016-01-31 – 2016-02-01 (×2): 80 mg via SUBCUTANEOUS
  Filled 2016-01-29 (×3): qty 0.8

## 2016-01-29 MED ORDER — HEPARIN (PORCINE) IN NACL 100-0.45 UNIT/ML-% IJ SOLN
1300.0000 [IU]/h | INTRAMUSCULAR | Status: DC
  Administered 2016-01-29 (×2): 1300 [IU]/h via INTRAVENOUS
  Filled 2016-01-29 (×2): qty 250

## 2016-01-29 MED ORDER — INSULIN ASPART 100 UNIT/ML ~~LOC~~ SOLN
0.0000 [IU] | Freq: Three times a day (TID) | SUBCUTANEOUS | Status: DC
  Administered 2016-01-30: 1 [IU] via SUBCUTANEOUS

## 2016-01-29 MED ORDER — ONDANSETRON HCL 4 MG/2ML IJ SOLN: 4.0000 mg | Freq: Four times a day (QID) | INTRAMUSCULAR | Status: DC | PRN

## 2016-01-29 MED ORDER — BIOTENE DRY MOUTH MT LIQD: 15.0000 mL | OROMUCOSAL | Status: DC | PRN

## 2016-01-29 MED ORDER — SORBITOL 70 % SOLN
960.0000 mL | TOPICAL_OIL | Freq: Once | ORAL | Status: DC
  Filled 2016-01-29: qty 240

## 2016-01-29 MED ORDER — FERROUS SULFATE 325 (65 FE) MG PO TABS
325.0000 mg | ORAL_TABLET | Freq: Every day | ORAL | Status: DC
  Administered 2016-01-31: 325 mg via ORAL
  Filled 2016-01-29 (×2): qty 1

## 2016-01-29 MED ORDER — ENOXAPARIN SODIUM 80 MG/0.8ML ~~LOC~~ SOLN
80.0000 mg | SUBCUTANEOUS | Status: DC
  Administered 2016-01-29: 80 mg via SUBCUTANEOUS
  Filled 2016-01-29: qty 0.8

## 2016-01-29 MED ORDER — METOPROLOL SUCCINATE ER 25 MG PO TB24
50.0000 mg | ORAL_TABLET | Freq: Every day | ORAL | Status: DC
  Filled 2016-01-29: qty 2

## 2016-01-29 MED ORDER — SORBITOL 70 % SOLN
30.0000 mL | Freq: Once | Status: DC
  Filled 2016-01-29: qty 30

## 2016-01-29 MED ORDER — TRAMADOL HCL 50 MG PO TABS
50.0000 mg | ORAL_TABLET | Freq: Two times a day (BID) | ORAL | Status: DC
  Administered 2016-01-30 – 2016-01-31 (×2): 50 mg via ORAL
  Filled 2016-01-29 (×5): qty 1

## 2016-01-29 MED ORDER — PANTOPRAZOLE SODIUM 40 MG PO TBEC
40.0000 mg | DELAYED_RELEASE_TABLET | Freq: Every day | ORAL | Status: DC
  Administered 2016-01-30 – 2016-01-31 (×2): 40 mg via ORAL
  Filled 2016-01-29 (×2): qty 1

## 2016-01-29 NOTE — Consult Note (Signed)
   [facility] CM Inpatient Consult   01/29/2016  Michelle Holmes [birth year] [id number]     Patient screened for potential [facility] Care Management services. Chart reviewed. Noted current discharge plan is for home with hospice.  There are no identifiable [name] Care Management needs at this time.   [name], MSN-Ed, RN,BSN [facility] Liaison [phone]

## 2016-01-29 NOTE — Progress Notes (Signed)
ANTICOAGULATION CONSULT NOTE - F/u Consult  Pharmacy Consult for Heparin Indication: DVT  Allergies  Allergen Reactions  . Penicillins Hives and Swelling    Has patient had a PCN reaction causing immediate rash, facial/tongue/throat swelling, SOB or lightheadedness with hypotension: yes Has patient had a PCN reaction causing severe rash involving mucus membranes or skin necrosis: no Has patient had a PCN reaction that required hospitalization no Has patient had a PCN reaction occurring within the last 10 years: no If all of the above answers are "NO", then may proceed with Cephalosporin use.     Patient Measurements: Height: 5\' 5"  (165.1 cm) Weight: 171 lb 8.3 oz (77.8 kg) IBW/kg (Calculated) : 57 Heparin Dosing Weight: 73.2 kg  Vital Signs: Temp: 98.9 F (37.2 C) (01/02 2338) Temp Source: Oral (01/02 2338) BP: 83/43 (01/03 0000) Pulse Rate: 91 (01/02 2200)  Labs:  Recent Labs  01/27/16 2351 01/28/16 0601 01/28/16 1444 01/28/16 2311  HGB 10.2* 9.2*  --   --   HCT 32.5* 29.1*  --   --   PLT PLATELET CLUMPS NOTED ON SMEAR, COUNT APPEARS ADEQUATE 178  --   --   APTT  --   --  30  --   LABPROT  --   --  14.5  --   INR  --   --  1.13  --   HEPARINUNFRC  --   --   --  <0.10*  CREATININE 4.10* 3.95*  --   --   CKTOTAL 69  --   --   --     Estimated Creatinine Clearance: 14.8 mL/min (by C-G formula based on SCr of 3.95 mg/dL (H)).   Medical History: Past Medical History:  Diagnosis Date  . Anemia   . CHF (congestive heart failure) ([place])   . Chronic back pain   . COPD (chronic obstructive pulmonary disease) ([place])   . Coronary artery disease   . Diabetes mellitus   . DJD (degenerative joint disease)   . Emphysema   . Hypercholesteremia   . Hypertension   . Myocardial infarction   . Obesity   . Obesity hypoventilation syndrome ([place])   . Renal insufficiency   . Scoliosis   . Sleep apnea   . Stroke ([place])   . TIA (transient ischemic attack)     Medications:   Scheduled:  . cefTRIAXone (ROCEPHIN)  IV  1 g Intravenous Q24H  . clopidogrel  75 mg Oral Daily  . feeding supplement  1 Container Oral BID BM  . feeding supplement (ENSURE ENLIVE)  237 mL Oral Q24H  . ferrous sulfate  325 mg Oral Q breakfast  . heparin  2,000 Units Intravenous Once  . Influenza vac split quadrivalent PF  0.5 mL Intramuscular Tomorrow-1000  . insulin aspart  0-5 Units Subcutaneous QHS  . insulin aspart  0-9 Units Subcutaneous TID WC  . multivitamin with minerals  1 tablet Oral Daily  . OLANZapine zydis  2.5 mg Oral QHS  . pantoprazole (PROTONIX) IV  40 mg Intravenous Q24H  . pneumococcal 23 valent vaccine  0.5 mL Intramuscular Tomorrow-1000  . sodium bicarbonate  650 mg Oral Daily  . sodium chloride flush  3 mL Intravenous Q12H  . sorbitol, milk of mag, mineral oil, glycerin (SMOG) enema  960 mL Rectal Once   Infusions:  . heparin     PRN: acetaminophen **OR** acetaminophen, HYDROmorphone (DILAUDID) injection, ondansetron (ZOFRAN) IV  Assessment: 66 year old female currently bedbound from sequela is of stroke 7 bedsores  who was brought to the ED per family with complaints of pain in repositioning in bed in pain all over and found to be hypotensive.  Left lower extremity is positive for a small segment of nonocclusive, mobile? thrombosis in the left common femoral vein.  Pharmacy is consulted to dose IV heparin. Received a dose of sq heparin at 06:30 this AM, therefore, will give a reduced dose bolus.  1/2 Baseline PTT, PT/INR pending CBC: Hgb low at 9.2, Plts low 178 CKD: SCr 3.95, CrCl ~15 ml/min No bleeding currently noted Concurrent plavix noted, PTA aspirin on hold 2311 HL=<0.10, no infusion or bleeding issues per RN  Goal of Therapy:  Heparin level 0.3-0.7 units/ml Monitor platelets by anticoagulation protocol: Yes   Plan:    Rebolus with 2000 units IV heparin now  Increase heparin drip to 1300 units/hr  Check heparin level 8 hr after heparin  increased  Daily heparin level and CBC   Lawana Pai R 01/29/2016,1:04 AM

## 2016-01-29 NOTE — Progress Notes (Signed)
[facility] PROGRESS NOTE    Progress Note  Michelle Holmes  XNA:[id number] DOB: 05-12-1950 DOA: 01/27/2016 PCP: Ricke Hey, MD     Brief Narrative:   Michelle Holmes is an 66 y.o. female Past medical history of stroke bedbound was brought into the ED for pain was found to be hypotensive. She's had a prolonged periods of poor oral intake with diarrhea and vomiting, In the setting of diuretics  Assessment/Plan:   Hypotension Secondary to hypovolemia with extreme dehydration on physical examination. On admission systolic blood pressure was in the 80's she was fluid resuscitated and started empirically on IV empiric Rocephin. Blood cultures and respiratory cultures have been negative is unlikely sepsis. Urine cultures remain negative to date. Discontinue Rocephin. She has remained afebrile with no leukocytosis.  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 We'll decrease fluids renal function panel is pending. KVO Fluid  Myalgias: Influenza PCR negative continue conservative management.  Uncontrolled type 2 DM with hyperosmolar nonketotic hyperglycemia (HCC) A1c of 6.6, continue sliding scale insulin Plus sliding scale. She is tolerating oral intake.  Chronic diastolic heart failure: Continue hold diuretic therapy, continue beta blockers.  Gravida: Continue PPI.  History of CVA/hypertension: Resume Toprol. Continue Lipitor Plavix.    Anemia of chronic disease: Continue iron supplementation. Hemoglobin Has had a mild drop, Likely due to hemodilution we'll affect. Continue to hold aspirin. No black stools or red stools. Baseline hemoglobin 8.5-9.5  Left lower DVT: Cont heparin. Family to meet with palliative care today.  Protein-calorie malnutrition, severe  Goals of care, counseling/discussionPalliative care by specialist Palliative Care meeting today.  Constipation: Started on MiraLAX and sorbitol she refuse her  enema.  DVT prophylaxis: heparin Family Communication:none Disposition Plan/Barrier to D/C: transfer to regular floor. Code Status:     Code Status Orders        Start     Ordered   01/28/16 0508  Full code  Continuous     01/28/16 0507    Code Status History    Date Active Date Inactive Code Status Order ID Comments User Context   12/11/2015  9:43 PM 12/14/2015  8:21 PM Full Code [id number]  Elmarie Shiley, MD Inpatient   11/23/2015  7:42 PM 11/29/2015 10:39 PM Full Code [id number]  Theodis Blaze, MD Inpatient   07/12/2015  8:32 AM 07/14/2015  9:11 PM Full Code [id number]  Rondel Jumbo, PA-C Inpatient   07/04/2015  8:46 PM 07/07/2015  7:55 PM Full Code [id number]  Rise Patience, MD ED   05/06/2015  8:16 AM 05/07/2015  2:38 PM Full Code [id number]  Charolette Forward, MD Inpatient   01/18/2015  5:04 PM 02/07/2015  4:14 PM Full Code [id number]  Lavon Paganini Oso, PA-C Inpatient   01/14/2015  1:15 AM 01/18/2015  5:04 PM Full Code [id number]  Reubin Milan, MD Inpatient   06/20/2014  2:08 PM 06/22/2014  1:19 PM Full Code [id number]  Charolette Forward, MD Inpatient   12/02/2010  5:31 AM 12/15/2010 10:47 PM Full Code [id number]  Joellen Jersey, RN Inpatient    Advance Directive Documentation   Flowsheet Row Most Recent Value  Type of Advance Directive  Living will  Pre-existing out of facility DNR order (yellow form or pink MOST form)  No data  "MOST" Form in Place?  No data        IV Access:    Peripheral IV   Procedures and diagnostic studies:  Dg Abd 1 View  Result Date: 01/28/2016 CLINICAL DATA:  Vomiting. EXAM: ABDOMEN - 1 VIEW COMPARISON:  Scout image for CT scan dated 01/15/2016 FINDINGS: There is a mottled but of air in the stomach as well as in the colon but there are no dilated loops of bowel. Moderate stool in the rectum. Surgical clips in the pelvis in the right mid and upper abdomen. Severe chronic thoracolumbar scoliosis. IMPRESSION: Benign-appearing abdomen.  Electronically Signed   By: Lorriane Shire M.D.   On: 01/28/2016 17:19   Dg Chest Port 1 View  Result Date: 01/27/2016 CLINICAL DATA:  Hypotension EXAM: PORTABLE CHEST 1 VIEW COMPARISON:  12/27/2015 FINDINGS: The patient is markedly rotated. Right CP angle non included. Mild atelectasis at the left base. No consolidation or gross E fusion. Stable enlarged cardiomediastinal allowing for rotation. No pneumothorax. IMPRESSION: Mild streaky atelectasis at the left lung base. Otherwise no acute interval changes allowing for patient rotation. Electronically Signed   By: Donavan Foil M.D.   On: 01/27/2016 22:42     Medical Consultants:    None.  Anti-Infectives:   Rocephin  Subjective:    Michelle Holmes She has no new complaints. She's been refusing labs and medication.  Objective:    Vitals:   01/29/16 0014 01/29/16 0200 01/29/16 0400 01/29/16 0406  BP: (!) 99/51 (!) 115/50 110/63   Pulse:   80   Resp: 18 15 12    Temp:    98.4 F (36.9 C)  TempSrc:    Oral  SpO2: 99% 99% 99%   Weight:      Height:        Intake/Output Summary (Last 24 hours) at 01/29/16 0729 Last data filed at 01/29/16 0400  Gross per 24 hour  Intake          1950.32 ml  Output              550 ml  Net          1400.32 ml   Filed Weights   01/27/16 2157 01/28/16 0500  Weight: 83.9 kg (185 lb) 77.8 kg (171 lb 8.3 oz)    Exam: General exam: In no acute distress. Respiratory system: Good air movement and clear to auscultation. Cardiovascular system: S1 & S2 heard, RRR.  Gastrointestinal system: Abdomen is nondistended, soft and nontender.  Extremities: No pedal edema. Psychiatry: Judgement and insight appear normal. Mood & affect appropriate.    Data Reviewed:    Labs: Basic Metabolic Panel:  Recent Labs Lab 01/27/16 2351 01/28/16 0601  NA 139 140  K 5.3* 5.0  CL 110 112*  CO2 19* 19*  GLUCOSE 107* 107*  BUN 90* 85*  CREATININE 4.10* 3.95*  CALCIUM 8.9 8.5*   GFR Estimated  Creatinine Clearance: 14.8 mL/min (by C-G formula based on SCr of 3.95 mg/dL (H)). Liver Function Tests:  Recent Labs Lab 01/27/16 2351 01/28/16 0601  AST 15 12*  ALT 10* 11*  ALKPHOS 67 58  BILITOT 0.7 0.5  PROT 6.9 6.4*  ALBUMIN 3.5 3.2*   No results for input(s): LIPASE, AMYLASE in the last 168 hours. No results for input(s): AMMONIA in the last 168 hours. Coagulation profile  Recent Labs Lab 01/28/16 1444  INR 1.13    CBC:  Recent Labs Lab 01/27/16 2351 01/28/16 0601  WBC 9.2 8.1  NEUTROABS 5.5  --   HGB 10.2* 9.2*  HCT 32.5* 29.1*  MCV 87.6 87.9  PLT PLATELET CLUMPS NOTED ON SMEAR, COUNT APPEARS ADEQUATE  178   Cardiac Enzymes:  Recent Labs Lab 01/27/16 2351  CKTOTAL 69   BNP (last 3 results) No results for input(s): PROBNP in the last 8760 hours. CBG:  Recent Labs Lab 01/28/16 0750 01/28/16 1157 01/28/16 1655 01/28/16 1811 01/28/16 2142  GLUCAP 96 115* 123* 114* 120*   D-Dimer: No results for input(s): DDIMER in the last 72 hours. Hgb A1c: No results for input(s): HGBA1C in the last 72 hours. Lipid Profile: No results for input(s): CHOL, HDL, LDLCALC, TRIG, CHOLHDL, LDLDIRECT in the last 72 hours. Thyroid function studies: No results for input(s): TSH, T4TOTAL, T3FREE, THYROIDAB in the last 72 hours.  Invalid input(s): FREET3 Anemia work up: No results for input(s): VITAMINB12, FOLATE, FERRITIN, TIBC, IRON, RETICCTPCT in the last 72 hours. Sepsis Labs:  Recent Labs Lab 01/27/16 2351 01/28/16 0145 01/28/16 0601  WBC 9.2  --  8.1  LATICACIDVEN  --  1.48  --    Microbiology Recent Results (from the past 240 hour(s))  Culture, blood (Routine X 2) w Reflex to ID Panel     Status: None (Preliminary result)   Collection Time: 01/27/16  1:15 AM  Result Value Ref Range Status   Specimen Description BLOOD LEFT ANTECUBITAL  Final   Special Requests IN PEDIATRIC BOTTLE 3ML  Final   Culture   Final    NO GROWTH < 12 HOURS Performed at  [facility]    Report Status PENDING  Incomplete  Respiratory Panel by PCR     Status: None   Collection Time: 01/28/16  4:04 AM  Result Value Ref Range Status   Adenovirus NOT DETECTED NOT DETECTED Final   Coronavirus 229E NOT DETECTED NOT DETECTED Final   Coronavirus HKU1 NOT DETECTED NOT DETECTED Final   Coronavirus NL63 NOT DETECTED NOT DETECTED Final   Coronavirus OC43 NOT DETECTED NOT DETECTED Final   Metapneumovirus NOT DETECTED NOT DETECTED Final   Rhinovirus / Enterovirus NOT DETECTED NOT DETECTED Final   Influenza A NOT DETECTED NOT DETECTED Final   Influenza B NOT DETECTED NOT DETECTED Final   Parainfluenza Virus 1 NOT DETECTED NOT DETECTED Final   Parainfluenza Virus 2 NOT DETECTED NOT DETECTED Final   Parainfluenza Virus 3 NOT DETECTED NOT DETECTED Final   Parainfluenza Virus 4 NOT DETECTED NOT DETECTED Final   Respiratory Syncytial Virus NOT DETECTED NOT DETECTED Final   Bordetella pertussis NOT DETECTED NOT DETECTED Final   Chlamydophila pneumoniae NOT DETECTED NOT DETECTED Final   Mycoplasma pneumoniae NOT DETECTED NOT DETECTED Final    Comment: Performed at [facility]  MRSA PCR Screening     Status: None   Collection Time: 01/28/16  6:12 AM  Result Value Ref Range Status   MRSA by PCR NEGATIVE NEGATIVE Final    Comment:        The GeneXpert MRSA Assay (FDA approved for NASAL specimens only), is one component of a comprehensive MRSA colonization surveillance program. It is not intended to diagnose MRSA infection nor to guide or monitor treatment for MRSA infections.      Medications:   . cefTRIAXone (ROCEPHIN)  IV  1 g Intravenous Q24H  . clopidogrel  75 mg Oral Daily  . feeding supplement  1 Container Oral BID BM  . feeding supplement (ENSURE ENLIVE)  237 mL Oral Q24H  . ferrous sulfate  325 mg Oral Q breakfast  . Influenza vac split quadrivalent PF  0.5 mL Intramuscular Tomorrow-1000  . insulin aspart  0-5 Units Subcutaneous QHS  .  insulin aspart  0-9 Units Subcutaneous TID WC  . multivitamin with minerals  1 tablet Oral Daily  . OLANZapine zydis  2.5 mg Oral QHS  . pantoprazole (PROTONIX) IV  40 mg Intravenous Q24H  . pneumococcal 23 valent vaccine  0.5 mL Intramuscular Tomorrow-1000  . sodium bicarbonate  650 mg Oral Daily  . sodium chloride flush  3 mL Intravenous Q12H  . sorbitol, milk of mag, mineral oil, glycerin (SMOG) enema  960 mL Rectal Once   Continuous Infusions: . heparin 1,300 Units/hr (01/29/16 0400)    Time spent: 25 min   LOS: 1 day   Charlynne Cousins  [facility] Pager [phone]  *Please refer to [email or web address], password [email or web address] to get updated schedule on who will round on this patient, as hospitalists switch teams weekly. If 7PM-7AM, please contact night-coverage at [email or web address], password [email or web address] for any overnight needs.  01/29/2016, 7:29 AM

## 2016-01-29 NOTE — Progress Notes (Signed)
Pt has refused CPAP for the night.  RT to monitor and assess as needed.  

## 2016-01-29 NOTE — Consult Note (Signed)
Consultation  Referring Provider: Dr. Aileen Fass     Primary Care Physician:  Ricke Hey, MD Primary Gastroenterologist:   Dr. Havery Moros      Reason for Consultation: Nausea and Vomiting, Constipation          Impression / Plan:   Impression: 1. Nausea and vomiting: For "a long time", per the patient, though she does not feel this is abnormal, she does tell me she has not been helped by any of the antinausea medications, but currently is not nauseous, denies heartburn or reflux; consider relation to constipation or other 2. Diarrhea?: This has not been documented by the staff but apparently was reported by family members in the past? 3. Poor oral intake: Per family, this has been worse over the past few months, question relation to bloating and constipation versus other including dementia 4. AKI: Baseline creatinine 3.4 5. Anemia of chronic disease: Patient is on iron supplementation which may be contributing to her constipation, baseline hemoglobin is around 8.5-9.5 6. Constipation: Patient tells me she is having regular bowel movements but cannot tell me when her last one was   Plan: 1. Discussed importance of SMOG enemas with the patient today. It seems after my discussion with her she is willing to proceed with at least 2 of these today to see if we can get her to have normal bowel movements. 2. Continue MiraLAX as ordered 3. Encourage by mouth intake 4. Patient is unable to tell a reliable history, uncertain how long these symptoms have been occurring ?would patient benefit from endoscopy? 5. Please await any further recommendations from Dr. Carlean Purl  Thank you for your kind consultation, we will continue to follow.  Michelle Holmes  01/29/2016, 10:56 AM Pager #: [phone]  Agree with Ms. Holmes's evaluation and management. Subsequent to her visit the patient has decided to pursue hospice. Will be available if needed - please call us back if so.  Gatha Mayer, MD, [facility] Gastroenterology [phone] (pager) [phone] after 5 PM, weekends and holidays  01/29/2016 5:25 PM         HPI:   Michelle Holmes is a 66 y.o. African-American  female with a past medical history stroke, bedbound, CHF, COPD, CAD, diabetes, obesity, "colon surgery", renal insufficiency, sleep apnea and urinary retention who presented to the ED on 01/27/16 with chronic emesis and new myalgias.   Patient had previously been seen in our clinic by myself on 01/15/16 but was in such severe pain with continued nausea that exam could not be performed and it was recommended that she proceed to the ER at that time. CT was done that day which showed minimal right hydronephrosis and proximal right hydroureter and bilateral nonobstructing nephrolithiasis. There is a 2.6 mm calcified calculus in the proximal right ureter at the level of the upper endplate of L3 vertebral body. Small calcified calculi noted right upper posterior aspect of  urinary bladder the largest measuring 2.7 mm. There was a Foley catheter within a decompressed urinary bladder. No small bowel or colonic obstruction. There were postsurgical changes noted in the right colon with no evidence of anastomotic stricture. Abundant stool noted in the rectum which measured 8.5 cm in diameter suspicious for fecal impaction. She was discharged home.   Today, at time of my exam it must be noted the patient is a very poor historian. She tells me that for the past few months she has had almost constant nausea and vomited "once a  week". She does not see this as a problem. She does tell me that she hasn't "felt like eating". According to previous 51 notes her family is largely concerned that she has not been eating at all and has been vomiting on a more regular basis than the patient condones today. Her family members are not present. The patient also tells me that she feels like she is having regular bowel movements, though  cannot tell me the last time she had one. She does describe a generalized abdominal discomfort, but overall is better than when I saw her at time of her last office visit. It should be noted that the patient's breakfast is sitting by her bedside and has not been touched.   Patient denies fever, chills, blood in her stool, melena, weight loss, fatigue or chronic abdominal pain.  Hospital course: Patient had an abdominal x-ray on 01/28/16 which showed moderate stool in the rectum and severe chronic thoracic and lumbar scoliosis with no dilated loops of bowel. Smog enemas 2 were ordered last night which the patient refused. She is to be started on MiraLAX this morning.  Past Medical History:  Diagnosis Date  . Anemia   . CHF (congestive heart failure) ([place])   . Chronic back pain   . COPD (chronic obstructive pulmonary disease) ([place])   . Coronary artery disease   . Diabetes mellitus   . DJD (degenerative joint disease)   . Emphysema   . Hypercholesteremia   . Hypertension   . Myocardial infarction   . Obesity   . Obesity hypoventilation syndrome ([place])   . Renal insufficiency   . Scoliosis   . Sleep apnea   . Stroke ([place])   . TIA (transient ischemic attack)     Past Surgical History:  Procedure Laterality Date  . COLON SURGERY    . CORONARY ANGIOPLASTY WITH STENT PLACEMENT      Family History  Problem Relation Age of Onset  . Diabetes Mother   . Stroke Maternal Aunt     Social History  Substance Use Topics  . Smoking status: Never Smoker  . Smokeless tobacco: Never Used  . Alcohol use No    Prior to Admission medications   Medication Sig Start Date End Date Taking? Authorizing Provider  amLODipine (NORVASC) 5 MG tablet Take 5 mg by mouth at bedtime.    Historical Provider, MD  aspirin 325 MG EC tablet Take 325 mg by mouth daily.    Historical Provider, MD  atorvastatin (LIPITOR) 80 MG tablet Take 1 tablet (80 mg total) by mouth daily at 6 PM. Patient taking differently:  Take 40 mg by mouth daily at 6 PM.  07/07/15   Mauricio Gerome Apley, MD  clopidogrel (PLAVIX) 75 MG tablet Take 1 tablet (75 mg total) by mouth daily. 02/07/15   Lavon Paganini Angiulli, PA-C  ferrous sulfate 325 (65 FE) MG tablet Take 1 tablet (325 mg total) by mouth daily with breakfast. 11/29/15   Florencia Reasons, MD  furosemide (LASIX) 20 MG tablet Take 1 tablet (20 mg total) by mouth every Monday, Wednesday, and Friday. Please start first dose on Wednesday 11/22 12/18/15   Albertine Patricia, MD  HYDROcodone-acetaminophen (NORCO/VICODIN) 5-325 MG tablet Take 1 tablet by mouth every 4 (four) hours as needed for severe pain. 09/26/15   [name], MD  Insulin Glargine (LANTUS SOLOSTAR) 100 UNIT/ML Solostar Pen Inject 10 Units into the skin at bedtime. 12/14/15   Albertine Patricia, MD  LYRICA 50 MG capsule  Take 1 capsule (50 mg total) by mouth daily. Patient taking differently: Take 50 mg by mouth 2 (two) times daily.  07/14/15   Silver Huguenin Elgergawy, MD  metoprolol succinate (TOPROL-XL) 50 MG 24 hr tablet Take 50 mg by mouth daily. Take with or immediately following a meal.    Historical Provider, MD  ondansetron (ZOFRAN) 4 MG tablet Take 1 tablet (4 mg total) by mouth every 6 (six) hours as needed for nausea or vomiting. 01/17/16   Levin Erp, PA  Oxycodone HCl 20 MG TABS Take 20 mg by mouth 3 (three) times daily as needed for pain. 11/21/15   Historical Provider, MD  pantoprazole (PROTONIX) 40 MG tablet Take 40 mg by mouth daily. 09/17/15   Historical Provider, MD  polyethylene glycol powder (MIRALAX) powder Take 17 g by mouth daily. 01/15/16   Jola Schmidt, MD  senna-docusate (SENOKOT-S) 8.6-50 MG tablet Take 1 tablet by mouth at bedtime. Do not take if diarrhea 11/29/15   Florencia Reasons, MD  sodium bicarbonate 650 MG tablet Take 1 tablet (650 mg total) by mouth daily. 11/29/15   Florencia Reasons, MD  traMADol Veatrice Bourbon) 50 MG tablet take 1 TABLET BY MOUTH EVERY 12 hours 12/30/15   Charlett Blake, MD    Current  Facility-Administered Medications  Medication Dose Route Frequency Provider Last Rate Last Dose  . acetaminophen (TYLENOL) tablet 650 mg  650 mg Oral Q6H PRN Edwin Dada, MD       Or  . acetaminophen (TYLENOL) suppository 650 mg  650 mg Rectal Q6H PRN Edwin Dada, MD      . atorvastatin (LIPITOR) tablet 40 mg  40 mg Oral q1800 Charlynne Cousins, MD      . clopidogrel (PLAVIX) tablet 75 mg  75 mg Oral Daily Edwin Dada, MD      . enoxaparin (LOVENOX) injection 80 mg  80 mg Subcutaneous Q24H Emiliano Dyer, RPH      . feeding supplement (BOOST / RESOURCE BREEZE) liquid 1 Container  1 Container Oral BID BM Eugenie Filler, MD      . feeding supplement (ENSURE ENLIVE) (ENSURE ENLIVE) liquid 237 mL  237 mL Oral Q24H Eugenie Filler, MD   237 mL at 01/28/16 1400  . ferrous sulfate tablet 325 mg  325 mg Oral Q breakfast Edwin Dada, MD      . HYDROmorphone (DILAUDID) injection 0.5 mg  0.5 mg Intravenous Q4H PRN Jannette Fogo, NP      . insulin aspart (novoLOG) injection 0-5 Units  0-5 Units Subcutaneous QHS Edwin Dada, MD      . insulin aspart (novoLOG) injection 0-9 Units  0-9 Units Subcutaneous TID WC Edwin Dada, MD      . metoprolol succinate (TOPROL-XL) 24 hr tablet 50 mg  50 mg Oral Daily Charlynne Cousins, MD      . multivitamin with minerals tablet 1 tablet  1 tablet Oral Daily Eugenie Filler, MD      . OLANZapine zydis (ZYPREXA) disintegrating tablet 2.5 mg  2.5 mg Oral QHS Jannette Fogo, NP      . ondansetron [facility]) injection 4 mg  4 mg Intravenous Q6H PRN Jannette Fogo, NP      . pantoprazole (PROTONIX) injection 40 mg  40 mg Intravenous Q24H Eugenie Filler, MD   40 mg at 01/28/16 1200  . pneumococcal 23 valent vaccine (PNU-IMMUNE) injection 0.5 mL  0.5 mL Intramuscular Tomorrow-1000 Quillian Quince  Durene Cal, MD      . polyethylene glycol (MIRALAX / GLYCOLAX) packet 17 g  17 g Oral BID Charlynne Cousins, MD   17 g at 01/29/16 1042  . sodium bicarbonate tablet 650 mg  650 mg Oral Daily Edwin Dada, MD      . sodium chloride flush (NS) 0.9 % injection 3 mL  3 mL Intravenous Q12H Edwin Dada, MD   3 mL at 01/29/16 1041  . sorbitol 70 % solution 30 mL  30 mL Oral Once Charlynne Cousins, MD      . sorbitol, milk of mag, mineral oil, glycerin (SMOG) enema  960 mL Rectal Once Levin Erp, [place]      . traMADol Veatrice Bourbon) tablet 50 mg  50 mg Oral Q12H Charlynne Cousins, MD        Allergies as of 01/27/2016 - Review Complete 01/27/2016  Allergen Reaction Noted  . Penicillins Hives and Swelling 12/01/2010     Review of Systems:   (limited due to poor history from pt) Constitutional: Positive for fatigue  Cardiovascular: No chest pain Respiratory: No SOB  Gastrointestinal: See HPI and otherwise negative Genitourinary: No change in urinary frequency Neurological: No dizziness Musculoskeletal: No new muscle or joint pain Hematologic: No bleeding Psychiatric: Positive for depression    Physical Exam:  Vital signs in last 24 hours: Temp:  [96.7 F (35.9 C)-99.1 F (37.3 C)] 98.3 F (36.8 C) (01/03 0755) Pulse Rate:  [79-97] 80 (01/03 0400) Resp:  [11-18] 13 (01/03 0800) BP: (83-127)/(43-70) 127/57 (01/03 0800) SpO2:  [97 %-100 %] 97 % (01/03 0800) Last BM Date:  (PTA)   General: Obese African-American female appears to be in NAD, Well developed, Well nourished, alert and cooperative Head:  Normocephalic and atraumatic. Eyes:   PEERL, EOMI. No icterus. Conjunctiva pink. Ears:  Normal auditory acuity. Neck:  Supple Throat: Oral cavity and pharynx without inflammation, swelling or lesion. Teeth in good condition. Lungs: Respirations even and unlabored. Lungs clear to auscultation bilaterally.   No wheezes, crackles, or rhonchi.  Heart: Normal S1, S2. No MRG. Regular rate and rhythm. No peripheral edema, cyanosis or pallor.  Abdomen:  Soft, nondistended,  nontender. No rebound or guarding. Normal bowel sounds. No appreciable masses or hepatomegaly. Rectal:  Not performed.  Msk:  Symmetrical without gross deformities..  Extremities:  Without edema, no deformity or joint abnormality.  Neurologic:  Alert and  oriented x4;  grossly normal neurologically.  Skin:   Dry and intact without significant lesions or rashes. Psychiatric: Oriented to person, place and time. Very poor historian, memory is impaired   LAB RESULTS:  Recent Labs  01/27/16 2351 01/28/16 0601 01/29/16 0700  WBC 9.2 8.1 6.1  HGB 10.2* 9.2* 8.5*  HCT 32.5* 29.1* 26.7*  PLT PLATELET CLUMPS NOTED ON SMEAR, COUNT APPEARS ADEQUATE 178 160   BMET  Recent Labs  01/27/16 2351 01/28/16 0601 01/29/16 0700  NA 139 140 142  K 5.3* 5.0 4.6  CL 110 112* 116*  CO2 19* 19* 18*  GLUCOSE 107* 107* 97  BUN 90* 85* 76*  CREATININE 4.10* 3.95* 3.29*  CALCIUM 8.9 8.5* 8.6*   LFT  Recent Labs  01/28/16 0601 01/29/16 0700  PROT 6.4*  --   ALBUMIN 3.2* 2.9*  AST 12*  --   ALT 11*  --   ALKPHOS 58  --   BILITOT 0.5  --    PT/INR  Recent Labs  01/28/16 1444  LABPROT  14.5  INR 1.13    STUDIES: Dg Abd 1 View  Result Date: 01/28/2016 CLINICAL DATA:  Vomiting. EXAM: ABDOMEN - 1 VIEW COMPARISON:  Scout image for CT scan dated 01/15/2016 FINDINGS: There is a mottled but of air in the stomach as well as in the colon but there are no dilated loops of bowel. Moderate stool in the rectum. Surgical clips in the pelvis in the right mid and upper abdomen. Severe chronic thoracolumbar scoliosis. IMPRESSION: Benign-appearing abdomen. Electronically Signed   By: Lorriane Shire M.D.   On: 01/28/2016 17:19   Dg Chest Port 1 View  Result Date: 01/27/2016 CLINICAL DATA:  Hypotension EXAM: PORTABLE CHEST 1 VIEW COMPARISON:  12/27/2015 FINDINGS: The patient is markedly rotated. Right CP angle non included. Mild atelectasis at the left base. No consolidation or gross E fusion. Stable  enlarged cardiomediastinal allowing for rotation. No pneumothorax. IMPRESSION: Mild streaky atelectasis at the left lung base. Otherwise no acute interval changes allowing for patient rotation. Electronically Signed   By: Donavan Foil M.D.   On: 01/27/2016 22:42     PREVIOUS ENDOSCOPIES:            none

## 2016-01-29 NOTE — Progress Notes (Signed)
Pt has continued to refuse all care measures including lab draws, q2h turns, baths, meds, etc. Educated several times and pt still refuses.   Will continue to monitor.

## 2016-01-29 NOTE — Progress Notes (Signed)
ANTICOAGULATION CONSULT NOTE - F/u Consult  Pharmacy Consult for Heparin Indication: DVT  Allergies  Allergen Reactions  . Penicillins Hives and Swelling    Has patient had a PCN reaction causing immediate rash, facial/tongue/throat swelling, SOB or lightheadedness with hypotension: yes Has patient had a PCN reaction causing severe rash involving mucus membranes or skin necrosis: no Has patient had a PCN reaction that required hospitalization no Has patient had a PCN reaction occurring within the last 10 years: no If all of the above answers are "NO", then may proceed with Cephalosporin use.     Patient Measurements: Height: 5\' 5"  (165.1 cm) Weight: 171 lb 8.3 oz (77.8 kg) IBW/kg (Calculated) : 57 Heparin Dosing Weight: 73.2 kg  Vital Signs: Temp: 98.3 F (36.8 C) (01/03 0755) Temp Source: Oral (01/03 0755) BP: 127/57 (01/03 0800) Pulse Rate: 80 (01/03 0400)  Labs:  Recent Labs  01/27/16 2351 01/28/16 0601 01/28/16 1444 01/28/16 2311 01/29/16 0700 01/29/16 0847  HGB 10.2* 9.2*  --   --  8.5*  --   HCT 32.5* 29.1*  --   --  26.7*  --   PLT PLATELET CLUMPS NOTED ON SMEAR, COUNT APPEARS ADEQUATE 178  --   --  160  --   APTT  --   --  30  --   --   --   LABPROT  --   --  14.5  --   --   --   INR  --   --  1.13  --   --   --   HEPARINUNFRC  --   --   --  <0.10*  --  <0.10*  CREATININE 4.10* 3.95*  --   --  3.29*  --   CKTOTAL 69  --   --   --   --   --     Estimated Creatinine Clearance: 17.8 mL/min (by C-G formula based on SCr of 3.29 mg/dL (H)).   Medical History: Past Medical History:  Diagnosis Date  . Anemia   . CHF (congestive heart failure) ([place])   . Chronic back pain   . COPD (chronic obstructive pulmonary disease) ([place])   . Coronary artery disease   . Diabetes mellitus   . DJD (degenerative joint disease)   . Emphysema   . Hypercholesteremia   . Hypertension   . Myocardial infarction   . Obesity   . Obesity hypoventilation syndrome ([place])   .  Renal insufficiency   . Scoliosis   . Sleep apnea   . Stroke ([place])   . TIA (transient ischemic attack)     Medications:  Scheduled:  . atorvastatin  40 mg Oral q1800  . clopidogrel  75 mg Oral Daily  . feeding supplement  1 Container Oral BID BM  . feeding supplement (ENSURE ENLIVE)  237 mL Oral Q24H  . ferrous sulfate  325 mg Oral Q breakfast  . insulin aspart  0-5 Units Subcutaneous QHS  . insulin aspart  0-9 Units Subcutaneous TID WC  . metoprolol succinate  50 mg Oral Daily  . multivitamin with minerals  1 tablet Oral Daily  . OLANZapine zydis  2.5 mg Oral QHS  . pantoprazole (PROTONIX) IV  40 mg Intravenous Q24H  . pneumococcal 23 valent vaccine  0.5 mL Intramuscular Tomorrow-1000  . polyethylene glycol  17 g Oral BID  . sodium bicarbonate  650 mg Oral Daily  . sodium chloride flush  3 mL Intravenous Q12H  . sorbitol  30 mL  Oral Once  . sorbitol, milk of mag, mineral oil, glycerin (SMOG) enema  960 mL Rectal Once  . traMADol  50 mg Oral Q12H   Infusions:  . heparin 1,300 Units/hr (01/29/16 0800)   PRN: acetaminophen **OR** acetaminophen, HYDROmorphone (DILAUDID) injection, ondansetron (ZOFRAN) IV  Assessment: 66 year old female currently bedbound from sequela is of stroke 7 bedsores who was brought to the ED per family with complaints of pain in repositioning in bed in pain all over and found to be hypotensive.  Left lower extremity is positive for a small segment of nonocclusive, mobile? thrombosis in the left common femoral vein.  Pharmacy is consulted to dose IV heparin. Received a dose of sq heparin at 06:30 this AM, therefore, will give a reduced dose bolus.  Today, 01/29/2016   Heparin level remains undetectable despite heparin re-bolus and rate increase  CBC: Hgb decreased to 8.5, Plts low 160  CKD: SCr 3.29, CrCl ~17 ml/min  No bleeding currently noted  Concurrent plavix noted, PTA aspirin on hold  Goal of Therapy:  Heparin level 0.3-0.7  units/ml Monitor platelets by anticoagulation protocol: Yes   Plan:   Due to difficulty obtaining therapeutic heparin level, spoke with MD - will change IV heparin to SQ lovenox  Stop IV heparin   In one hour, begin Lovenox 1mg /kg SQ q24h for CrCl > 30 ml/min  Continue to monitor renal function, CBC and signs/symptoms of bleeding  Peggyann Juba, PharmD, BCPS Pager: [phone] 01/29/2016,10:34 AM

## 2016-01-29 NOTE — Progress Notes (Signed)
Patient refuses to be turned every 2 hrs,refuses all meds and enema this am.

## 2016-01-29 NOTE — Progress Notes (Addendum)
Daughter , Alexis Goodell, not aware or not understanding of current established plan of care for Hospice. She wants her mother to be fully worked up for her health problems. Dr. Olevia Bowens notified. He is unable to come to talk to family at this time. Daughter requests that DNR be reversed, and orders be resumed.  CN Wandra Scot involved  in discussion with Daughter. Daughter requests to be made health care POA.

## 2016-01-29 NOTE — Progress Notes (Signed)
[place] with Alinda Sierras, [facility] today and she requests that we see patient tomorrow morning first thing for home with hospice referral.     Will come to see patient between 0830-0900 on 01/29/16.  Thank you,   Edyth Gunnels, RN, BSN [facility] Liaison  All hospital liaisons are now on [place].  Please feel free to call me directly at [phone] or call call hospice at [phone] after 5p.

## 2016-01-29 NOTE — Progress Notes (Signed)
Daily Progress Note   Patient Name: Michelle Holmes       Date: 01/29/2016 DOB: [birth year]  Age: 66 y.o. MRN#: [id number] Attending Physician: Charlynne Cousins, MD Primary Care Physician: Ricke Hey, MD Admit Date: 01/27/2016  Reason for Consultation/Follow-up: Establishing goals of care, Interfamily conflict and Psychosocial/spiritual support; Pt presented with pain and was found to be hypotensive. On admission pt refusing all medication and majority of oral intake. Palliative consulted to help establish goals of care in light of this.   Subjective: Michelle Holmes recognized me when I entered the room and was happy to engage me in conversation. She denies nausea, vomiting, and reflux. She did endorse a mild generalized abdominal pain, however did not want any medication for it. On my arrival she continued to refuse all of her morning medication, including previously ordered enemas and oral bowel agents. Please see Assessment below for full conversation.  Length of Stay: 1  Current Medications: Scheduled Meds:  . atorvastatin  40 mg Oral q1800  . clopidogrel  75 mg Oral Daily  . enoxaparin (LOVENOX) injection  80 mg Subcutaneous Q24H  . feeding supplement  1 Container Oral BID BM  . feeding supplement (ENSURE ENLIVE)  237 mL Oral Q24H  . ferrous sulfate  325 mg Oral Q breakfast  . insulin aspart  0-5 Units Subcutaneous QHS  . insulin aspart  0-9 Units Subcutaneous TID WC  . metoprolol succinate  50 mg Oral Daily  . multivitamin with minerals  1 tablet Oral Daily  . OLANZapine zydis  2.5 mg Oral QHS  . pantoprazole (PROTONIX) IV  40 mg Intravenous Q24H  . pneumococcal 23 valent vaccine  0.5 mL Intramuscular Tomorrow-1000  . polyethylene glycol  17 g Oral BID  . sodium bicarbonate  650 mg Oral Daily  . sodium chloride flush  3 mL  Intravenous Q12H  . sorbitol  30 mL Oral Once  . sorbitol, milk of mag, mineral oil, glycerin (SMOG) enema  960 mL Rectal Once  . traMADol  50 mg Oral Q12H    Continuous Infusions:   PRN Meds: acetaminophen **OR** acetaminophen, HYDROmorphone (DILAUDID) injection, ondansetron (ZOFRAN) IV  Physical Exam  -Abd soft and non-tender when palpated.  -Pt oriented to person, place, and situation. She is confused on time.           Vital Signs: BP 126/68   Pulse 80   Temp 98.3 F (36.8 C) (Oral)   Resp (!) 22   Ht 5\' 5"  (1.651 m)   Wt 77.8 kg (171 lb 8.3 oz)   SpO2 100%   BMI 28.54 kg/m  SpO2: SpO2: 100 % O2 Device: O2 Device: Not Delivered O2 Flow Rate:    Intake/output summary:  Intake/Output Summary (Last 24 hours) at 01/29/16 1130 Last data filed at 01/29/16 1041  Gross per 24 hour  Intake          2082.32 ml  Output              550 ml  Net          1532.32 ml   LBM: Last BM Date:  (PTA) Baseline Weight: Weight: 83.9 kg (185 lb) Most recent weight:  Weight: 77.8 kg (171 lb 8.3 oz)  Patient Active Problem List   Diagnosis Date Noted  . Chronic diastolic CHF (congestive heart failure) ([place]) 01/28/2016  . Hypotension 01/28/2016  . Decubitus ulcer of sacral region, stage 2 01/28/2016  . Dehydration 01/28/2016  . Protein-calorie malnutrition, severe 01/28/2016  . Emesis   . Goals of care, counseling/discussion   . Palliative care by specialist   . AKI (acute kidney injury) ([place]) 12/11/2015  . Anemia in other chronic diseases classified elsewhere   . Acute urinary retention   . Acute kidney injury superimposed on chronic kidney disease ([place])   . Pressure injury of skin 11/24/2015  . Acute metabolic encephalopathy   . Chronic kidney disease (CKD), stage IV (severe) ([place]) 07/06/2015  . Acute CVA (cerebrovascular accident) ([place])   . Acute encephalopathy 07/04/2015  . Sinus bradycardia 07/04/2015  . Urinary tract infection without hematuria 07/04/2015  . Parietal lobe  infarction ([place]) 07/04/2015  . Spastic hemiplegia affecting nondominant side ([place]) 07/01/2015  . Obesity, morbid ([place]) 07/01/2015  . Thrombocytopenia ([place])   . Acute ischemic VBA thalamic stroke ([place]) 01/18/2015  . Acute kidney injury superimposed on CKD ([place])   . Dysarthria   . Lethargy   . Labile blood pressure   . Hyperlipidemia   . History of CVA with residual deficit   . Chronic obstructive pulmonary disease ([place])   . Hemiparesis, aphasia, and dysphagia as late effect of cerebrovascular accident (CVA) ([place])   . Acute ischemic stroke ([place])   . Right hemiplegia ([place])   . Right sided weakness 01/13/2015  . Abdominal pain 06/20/2014  . Altered mental status 02/08/2012  . OSA on CPAP 12/08/2010  . Morbid obesity ([place]) 12/08/2010  . CAD (coronary artery disease) 12/08/2010  . Cellulitis of pubic region 12/01/2010  . Uncontrolled type 2 DM with hyperosmolar nonketotic hyperglycemia ([place]) 12/01/2010    Palliative Care Assessment & Plan   HPI: 66 y.o. female  with past medical history of hx of CVA with residual right hemiparesis, now bedbound, OSA on CPAP, CKD IV-V, baseline Cr 3.5-3, chronic diastolic CHF, IDDM, and hx of colon cancer admitted on 01/27/2016 with generalized pain. Family also reports ongoing vomiting for the past two months. In that time Michelle Holmes has had progressively poor intake and has been refusing medications. On admission she was found to be hypotensive, likely secondary to hypovolemia from dehydration. Also concern for a UTI, and now on antibiotics.  Assessment: I had a long meeting with pt's family on 1/2. They had a number of concerns, which included her vomiting for the past 2 months, decreased oral intake, and refusal to take medications. The latter two issues they felt was from either (or a combination of) depression and fear of vomiting. Since admission she has had no vomiting episodes, and did eat food at dinner on 1/2.  In term of eating overall, she has had  persistently poor oral intake and presented severely dehydrated. Despite no vomiting since admission, her intake has been limited to single meals only when pressured by family.   This morning, I spent time with Michelle Holmes at her bedside. She expressed a strong desire to die. She felt tired from her debility, and a longing to be with family that have passed before her. She also expressed that she felt God was calling for her and she was ready to rest peacefully in heaven. I asked if she felt depressed, and she expressed that she felt sad, but knew she wanted  to die. She had stopped taking her medication and was minimally eating because of her desire for death. One of her daughters and one granddaughter were present for this conversation, and agreed that she was clear in her wishes. On the arrival of another daughter and granddaughter the tenor of the conversation changed. They were very concerned she wanted to die because she felt a burden on her family, which they strongly denied. Mrs. Maranto became very withdrawn and asked for a few minutes to be with her family and talk about these things.   When I returned to the room she had the majority of her family members present. After discussion amongst themselves, Mrs. Kump continued to verbalize a strong desire to go home and die. Her family was extremely upset and tearful, but she remained strong in her conviction that she was ready. We discussed her medications, including the purpose of them. She does not want to take any medications, she does not want her blood sugar checked, she does not want insulin, and she does not want any further anticoagulation for her known blood clots. I explained the purpose of comfort medications, and reinforced that these medications will not prolong her life or hasten her death, they are only to help keep her comfortable.  Recommendations/Plan:  DNR, comfort care only. No further labs, cardiac monitoring, or aggressive interventions.    Medications adjusted to reflect her wishes, PRN mediation available for comfort  CM consulted to facilitate home hospice. We do need to ensure she will not loose her current home aids if she proceeds with Hospice care.   Goals of Care and Additional Recommendations:  Limitations on Scope of Treatment: Full Comfort Care  Code Status:  DNR  Prognosis:   < 6 months; pt presented with severe dehydration and resultant hypotension. Her intake is variable but remains inadequate. She is refusing all medication, including her blood pressure medication, insulin, and anticoagulation for known DVT.   Discharge Planning:  Likely home with Hospice. Pt has home aids through insurance, will need to ensrue those will not be lost if she recieves Hospice services.   Care plan was discussed with pt, pt's family, primary physician, care nurse.  Thank you for allowing the Palliative Medicine Team to assist in the care of this patient.       Total Time 180 minutes Prolonged Time Billed  yes       Greater than 50%  of this time was spent counseling and coordinating care related to the above assessment and plan.  Charlynn Court, NP Palliative Medicine Team [phone] pager (7a-5p) Team Phone # [phone]

## 2016-01-29 NOTE — Plan of Care (Addendum)
RN, Fraser Din, paged this NP about code status and tx plan. Per RN, she paged attending who was unable to come and speak with family. Daughter at bedside requesting reversal of DNR and comfort care, stating she wants her mother worked up for everything. Per RN, pt agrees with this even though pt is confused at times. RN's conversation was held with [facility] RN in the room as witness. Documentation per RN is on the chart.  Per chart, pt was seen by palliative care today and consented to comfort care and Hospice services. Orders d/c'd today to reflect this decision. As this NP is unfamiliar to this case (just on call), NP will reverse DNR for tonight to FULL CODE until attending can discuss wishes with pt and daughter in am. For now, will place pt back on tx for DVT, tele, SSI, IVFs, and redo labs. Will monitor BP for now off BP meds as pt has had hypotension. Rest of plan per attending/palliative care in am.  KJKG, NP [facility] Update: The RN called NP and daughter has now decided to make pt a DNR but continue other treatment. In other words, no comfort care for now. The daughter that is here is the oldest child. Given there is no POA or husband, the decision is hers. The pt is confused. NP spoke to daughter on the phone to verify her wishes. Daughter confirmed DNR and continiuing other treatment. Explained to daughter that NP had restarted IVF, DVT tx, and DM care and rest of care will be restarted as necessary by attending in am. Daughter voiced understanding.  DNR orders placed.  KJKG, NP [facility]

## 2016-01-29 NOTE — Progress Notes (Signed)
RT attempted to place patient on CPAP. Patient refused. RT will continue to monitor

## 2016-01-29 NOTE — Progress Notes (Signed)
CM consult for home hospice. This CM met with pt and daughter at bedside to offer choice for home hospice services. [facility] was chosen and referral was made. Pt states she has all equipment she needs at home and she has personal aides as well. CM will continue to follow and assist with DC needs. Marney Doctor RN,BSN,NCM [phone]

## 2016-01-29 NOTE — Progress Notes (Signed)
   01/29/16 1300  Clinical Encounter Type  Visited With Family  Visit Type Initial;Psychological support;Spiritual support;Critical Care  Referral From Nurse  Consult/Referral To Chaplain  Spiritual Encounters  Spiritual Needs Emotional;Other (Comment);Grief support Special educational needs teacher)  Stress Factors  Patient Stress Factors Not reviewed  Family Stress Factors Loss;Major life changes;Health changes;Family relationships   I visited with the patient's daughter in the consultation room per verbal referral by the ICU Nurse Director. The patient's daughter was in crisis at the time and very tearful over the patient's medical wishes.  The daughter explained that she was upset because her mother, the patient, told her that she was ready to die and did not want to get treatment for her condition.  The patient's daughter told me that she has a strong bond with her mother and that she feels that the patient is giving up.  The daughter's faith is very important to her and she believes that God has brought her mother through so much that God can heal her this time too.  Pts. Daughter states that she understands the patient's medical condition and feels that the patient just doesn't want to be a "burden" on her children. The patient's daughters are not in agreement about the patient's medical decisions and their is family friction.  The patient's daughter requested that a Chaplain see her mother and offer support.   I will follow-up.   Please, contact Spiritual Care for further assistance.   [place] M.Div.

## 2016-01-30 DIAGNOSIS — Z823 Family history of stroke: Secondary | ICD-10-CM

## 2016-01-30 DIAGNOSIS — Z880 Allergy status to penicillin: Secondary | ICD-10-CM

## 2016-01-30 DIAGNOSIS — I69815 Cognitive social or emotional deficit following other cerebrovascular disease: Secondary | ICD-10-CM

## 2016-01-30 DIAGNOSIS — Z794 Long term (current) use of insulin: Secondary | ICD-10-CM

## 2016-01-30 DIAGNOSIS — Z79899 Other long term (current) drug therapy: Secondary | ICD-10-CM

## 2016-01-30 DIAGNOSIS — Z9889 Other specified postprocedural states: Secondary | ICD-10-CM

## 2016-01-30 DIAGNOSIS — I824Z2 Acute embolism and thrombosis of unspecified deep veins of left distal lower extremity: Secondary | ICD-10-CM

## 2016-01-30 DIAGNOSIS — Z833 Family history of diabetes mellitus: Secondary | ICD-10-CM

## 2016-01-30 LAB — GLUCOSE, CAPILLARY
Glucose-Capillary: 117 mg/dL — ABNORMAL HIGH (ref 65–99)
Glucose-Capillary: 135 mg/dL — ABNORMAL HIGH (ref 65–99)
Glucose-Capillary: 90 mg/dL (ref 65–99)

## 2016-01-30 MED ORDER — HYDROCERIN EX CREA
TOPICAL_CREAM | Freq: Two times a day (BID) | CUTANEOUS | Status: DC
  Administered 2016-01-30 – 2016-01-31 (×2): via TOPICAL
  Filled 2016-01-30: qty 113

## 2016-01-30 MED ORDER — WARFARIN - PHARMACIST DOSING INPATIENT: Freq: Every day | Status: DC

## 2016-01-30 MED ORDER — WARFARIN SODIUM 5 MG PO TABS
7.5000 mg | ORAL_TABLET | Freq: Once | ORAL | Status: AC
  Administered 2016-01-30: 7.5 mg via ORAL
  Filled 2016-01-30: qty 1

## 2016-01-30 MED ORDER — PATIENT'S GUIDE TO USING COUMADIN BOOK
Freq: Once | Status: DC
  Filled 2016-01-30 (×2): qty 1

## 2016-01-30 NOTE — Progress Notes (Signed)
[facility] PROGRESS NOTE    Progress Note  Michelle Holmes  RSW:[id number] DOB: 23-Oct-1950 DOA: 01/27/2016 PCP: Ricke Hey, MD     Brief Narrative:   [name] is an 66 y.o. female Past medical history of stroke bedbound was brought into the ED for pain was found to be hypotensive. She's had a prolonged periods of poor oral intake with diarrhea and vomiting, In the setting of diuretics  Assessment/Plan:   Hypotension Secondary to hypovolemia with extreme dehydration on physical examination. Blood cultures and respiratory cultures have been negative is unlikely sepsis. Urine cultures remain negative to date. She has remained afebrile with no leukocytosis. She is refusing labs and medications. Will call psyq for capacity evaluation.  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 We'll decrease fluids renal function panel is pending. KVO Fluid  Myalgias: Influenza PCR negative continue conservative management.  Uncontrolled type 2 DM with hyperosmolar nonketotic hyperglycemia ([place]): A1c of 6.6, continue sliding scale insulin Plus sliding scale. She is tolerating oral intake.  Chronic diastolic heart failure: Continue hold diuretic therapy, continue beta blockers.  GERD: Continue PPI.  History of CVA/hypertension: Resume Toprol. Continue Lipitor Plavix.    Anemia of chronic disease: Continue iron supplementation. Hemoglobin Has had a mild drop, Likely due to hemodilution we'll affect. Continue to hold aspirin. No black stools or red stools. Baseline hemoglobin 8.5-9.5  Left lower DVT: Cont lovenox. Family to meet with palliative care today. Patient is refusing her coumadin.  Protein-calorie malnutrition, severe  Goals of care, counseling/discussionPalliative care by specialist Patient and family going back and forth on goals of care. To meet with PMT again.  Constipation: Started on MiraLAX and sorbitol  she refuse her enema.  DVT prophylaxis: heparin Family Communication:none Disposition Plan/Barrier to D/C: transfer to regular floor. Code Status:     Code Status Orders        Start     Ordered   01/28/16 0508  Full code  Continuous     01/28/16 0507    Code Status History    Date Active Date Inactive Code Status Order ID Comments User Context   12/11/2015  9:43 PM 12/14/2015  8:21 PM Full Code [id number]  Elmarie Shiley, MD Inpatient   11/23/2015  7:42 PM 11/29/2015 10:39 PM Full Code [id number]  Theodis Blaze, MD Inpatient   07/12/2015  8:32 AM 07/14/2015  9:11 PM Full Code [id number]  Rondel Jumbo, PA-C Inpatient   07/04/2015  8:46 PM 07/07/2015  7:55 PM Full Code [id number]  Rise Patience, MD ED   05/06/2015  8:16 AM 05/07/2015  2:38 PM Full Code [id number]  Charolette Forward, MD Inpatient   01/18/2015  5:04 PM 02/07/2015  4:14 PM Full Code [id number]  Lavon Paganini Noma, PA-C Inpatient   01/14/2015  1:15 AM 01/18/2015  5:04 PM Full Code [id number]  Reubin Milan, MD Inpatient   06/20/2014  2:08 PM 06/22/2014  1:19 PM Full Code [id number]  Charolette Forward, MD Inpatient   12/02/2010  5:31 AM 12/15/2010 10:47 PM Full Code [id number]  Joellen Jersey, RN Inpatient    Advance Directive Documentation   Flowsheet Row Most Recent Value  Type of Advance Directive  Living will  Pre-existing out of facility DNR order (yellow form or pink MOST form)  No data  "MOST" Form in Place?  No data        IV Access:    Peripheral IV  Procedures and diagnostic studies:   Dg Abd 1 View  Result Date: 01/28/2016 CLINICAL DATA:  Vomiting. EXAM: ABDOMEN - 1 VIEW COMPARISON:  Scout image for CT scan dated 01/15/2016 FINDINGS: There is a mottled but of air in the stomach as well as in the colon but there are no dilated loops of bowel. Moderate stool in the rectum. Surgical clips in the pelvis in the right mid and upper abdomen. Severe chronic thoracolumbar scoliosis. IMPRESSION: Benign-appearing  abdomen. Electronically Signed   By: Lorriane Shire M.D.   On: 01/28/2016 17:19     Medical Consultants:    None.  Anti-Infectives:   Rocephin  Subjective:    Michelle Holmes She has no new complains. She's been refusing labs and medication.  Objective:    Vitals:   01/29/16 1725 01/29/16 2000 01/29/16 2355 01/30/16 0745  BP: 135/89  122/70 (!) 94/56  Pulse:   82   Resp: 16  13 14   Temp:  98.4 F (36.9 C)    TempSrc:  Oral    SpO2:   99% 99%  Weight:      Height:        Intake/Output Summary (Last 24 hours) at 01/30/16 0755 Last data filed at 01/30/16 0545  Gross per 24 hour  Intake          1553.25 ml  Output             1000 ml  Net           553.25 ml   Filed Weights   01/27/16 2157 01/28/16 0500  Weight: 83.9 kg (185 lb) 77.8 kg (171 lb 8.3 oz)    Exam: General exam: In no acute distress. Respiratory system: Good air movement and clear to auscultation. Cardiovascular system: S1 & S2 heard, RRR.  Gastrointestinal system: Abdomen is nondistended, soft and nontender.  Extremities: No pedal edema. Psychiatry: Judgement and insight appear normal. Mood & affect appropriate.    Data Reviewed:    Labs: Basic Metabolic Panel:  Recent Labs Lab 01/27/16 2351 01/28/16 0601 01/29/16 0700  NA 139 140 142  K 5.3* 5.0 4.6  CL 110 112* 116*  CO2 19* 19* 18*  GLUCOSE 107* 107* 97  BUN 90* 85* 76*  CREATININE 4.10* 3.95* 3.29*  CALCIUM 8.9 8.5* 8.6*  PHOS  --   --  3.6   GFR Estimated Creatinine Clearance: 17.8 mL/min (by C-G formula based on SCr of 3.29 mg/dL (H)). Liver Function Tests:  Recent Labs Lab 01/27/16 2351 01/28/16 0601 01/29/16 0700  AST 15 12*  --   ALT 10* 11*  --   ALKPHOS 67 58  --   BILITOT 0.7 0.5  --   PROT 6.9 6.4*  --   ALBUMIN 3.5 3.2* 2.9*   No results for input(s): LIPASE, AMYLASE in the last 168 hours. No results for input(s): AMMONIA in the last 168 hours. Coagulation profile  Recent Labs Lab 01/28/16 1444    INR 1.13    CBC:  Recent Labs Lab 01/27/16 2351 01/28/16 0601 01/29/16 0700  WBC 9.2 8.1 6.1  NEUTROABS 5.5  --   --   HGB 10.2* 9.2* 8.5*  HCT 32.5* 29.1* 26.7*  MCV 87.6 87.9 89.6  PLT PLATELET CLUMPS NOTED ON SMEAR, COUNT APPEARS ADEQUATE 178 160   Cardiac Enzymes:  Recent Labs Lab 01/27/16 2351  CKTOTAL 69   BNP (last 3 results) No results for input(s): PROBNP in the last 8760 hours. CBG:  Recent  Labs Lab 01/28/16 1655 01/28/16 1811 01/28/16 2142 01/29/16 0738 01/29/16 1129  GLUCAP 123* 114* 120* 83 152*   D-Dimer: No results for input(s): DDIMER in the last 72 hours. Hgb A1c: No results for input(s): HGBA1C in the last 72 hours. Lipid Profile: No results for input(s): CHOL, HDL, LDLCALC, TRIG, CHOLHDL, LDLDIRECT in the last 72 hours. Thyroid function studies: No results for input(s): TSH, T4TOTAL, T3FREE, THYROIDAB in the last 72 hours.  Invalid input(s): FREET3 Anemia work up: No results for input(s): VITAMINB12, FOLATE, FERRITIN, TIBC, IRON, RETICCTPCT in the last 72 hours. Sepsis Labs:  Recent Labs Lab 01/27/16 2351 01/28/16 0145 01/28/16 0601 01/29/16 0700  WBC 9.2  --  8.1 6.1  LATICACIDVEN  --  1.48  --   --    Microbiology Recent Results (from the past 240 hour(s))  Culture, blood (Routine X 2) w Reflex to ID Panel     Status: None (Preliminary result)   Collection Time: 01/27/16  1:15 AM  Result Value Ref Range Status   Specimen Description BLOOD LEFT ANTECUBITAL  Final   Special Requests IN PEDIATRIC BOTTLE 3ML  Final   Culture   Final    NO GROWTH 1 DAY Performed at [facility]    Report Status PENDING  Incomplete  Urine culture     Status: Abnormal   Collection Time: 01/28/16 12:35 AM  Result Value Ref Range Status   Specimen Description URINE, CATHETERIZED  Final   Special Requests NONE  Final   Culture MULTIPLE SPECIES PRESENT, SUGGEST RECOLLECTION (A)  Final   Report Status 01/29/2016 FINAL  Final  Respiratory  Panel by PCR     Status: None   Collection Time: 01/28/16  4:04 AM  Result Value Ref Range Status   Adenovirus NOT DETECTED NOT DETECTED Final   Coronavirus 229E NOT DETECTED NOT DETECTED Final   Coronavirus HKU1 NOT DETECTED NOT DETECTED Final   Coronavirus NL63 NOT DETECTED NOT DETECTED Final   Coronavirus OC43 NOT DETECTED NOT DETECTED Final   Metapneumovirus NOT DETECTED NOT DETECTED Final   Rhinovirus / Enterovirus NOT DETECTED NOT DETECTED Final   Influenza A NOT DETECTED NOT DETECTED Final   Influenza B NOT DETECTED NOT DETECTED Final   Parainfluenza Virus 1 NOT DETECTED NOT DETECTED Final   Parainfluenza Virus 2 NOT DETECTED NOT DETECTED Final   Parainfluenza Virus 3 NOT DETECTED NOT DETECTED Final   Parainfluenza Virus 4 NOT DETECTED NOT DETECTED Final   Respiratory Syncytial Virus NOT DETECTED NOT DETECTED Final   Bordetella pertussis NOT DETECTED NOT DETECTED Final   Chlamydophila pneumoniae NOT DETECTED NOT DETECTED Final   Mycoplasma pneumoniae NOT DETECTED NOT DETECTED Final    Comment: Performed at [facility]  MRSA PCR Screening     Status: None   Collection Time: 01/28/16  6:12 AM  Result Value Ref Range Status   MRSA by PCR NEGATIVE NEGATIVE Final    Comment:        The GeneXpert MRSA Assay (FDA approved for NASAL specimens only), is one component of a comprehensive MRSA colonization surveillance program. It is not intended to diagnose MRSA infection nor to guide or monitor treatment for MRSA infections.      Medications:   . enoxaparin (LOVENOX) injection  80 mg Subcutaneous Q24H  . ferrous sulfate  325 mg Oral Q breakfast  . insulin aspart  0-5 Units Subcutaneous QHS  . insulin aspart  0-9 Units Subcutaneous TID WC  . pantoprazole  40  mg Oral Daily  . polyethylene glycol  17 g Oral BID  . traMADol  50 mg Oral Q12H   Continuous Infusions: . sodium chloride 75 mL/hr at 01/30/16 0400    Time spent: 15 min   LOS: 2 days   Charlynne Cousins  [facility] Pager [phone]  *Please refer to [email or web address], password [email or web address] to get updated schedule on who will round on this patient, as hospitalists switch teams weekly. If 7PM-7AM, please contact night-coverage at [email or web address], password [email or web address] for any overnight needs.  01/30/2016, 7:55 AM

## 2016-01-30 NOTE — Progress Notes (Signed)
   01/30/16 0900  Clinical Encounter Type  Visited With Patient and family together  Visit Type Follow-up;Psychological support;Spiritual support;Critical Care  Referral From Physician  Consult/Referral To Chaplain  Spiritual Encounters  Spiritual Needs Emotional;Other (Comment) (Pastoral Conversation/Support)  Stress Factors  Patient Stress Factors None identified  Family Stress Factors None identified   I visited with the patient and her family per Spiritual Care consult by the physician.  The patient had a happy affect upon my arrival and stated that she was "doing good." When asked if she or the family would like Spiritual Care she stated that they did not at this time.  I let her know that she could have a nurse page [place] at anytime if she would like to speak with a Chaplain.   Please, contact Spiritual Care for further assistance.   [place] M.Div.

## 2016-01-30 NOTE — Progress Notes (Signed)
[place] with Cookie, [facility] today and she advised she has no new information on patient regarding Hospice.   She will check with MD, Palliative, etc., and let me know how to proceed.  She anticipates it will be tomorrow morning before I get updated further.   Thank you,  Edyth Gunnels, RN, BSN [place] [facility] Liaison  All hospital liaisons are now on [place].  Please feel free to call me directly at [phone] or call hospice at [phone] after 5pm.

## 2016-01-30 NOTE — Progress Notes (Addendum)
ANTICOAGULATION CONSULT NOTE - F/u Consult  Pharmacy Consult for Lovenox/Warfarin Indication: DVT  Allergies  Allergen Reactions  . Penicillins Hives and Swelling    Has patient had a PCN reaction causing immediate rash, facial/tongue/throat swelling, SOB or lightheadedness with hypotension: yes Has patient had a PCN reaction causing severe rash involving mucus membranes or skin necrosis: no Has patient had a PCN reaction that required hospitalization no Has patient had a PCN reaction occurring within the last 10 years: no If all of the above answers are "NO", then may proceed with Cephalosporin use.     Patient Measurements: Height: 5\' 5"  (165.1 cm) Weight: 171 lb 8.3 oz (77.8 kg) IBW/kg (Calculated) : 57 Heparin Dosing Weight: 73.2 kg  Vital Signs: BP: 94/56 (01/04 0745) Pulse Rate: 82 (01/03 2355)  Labs:  Recent Labs  01/27/16 2351 01/28/16 0601 01/28/16 1444 01/28/16 2311 01/29/16 0700 01/29/16 0847  HGB 10.2* 9.2*  --   --  8.5*  --   HCT 32.5* 29.1*  --   --  26.7*  --   PLT PLATELET CLUMPS NOTED ON SMEAR, COUNT APPEARS ADEQUATE 178  --   --  160  --   APTT  --   --  30  --   --   --   LABPROT  --   --  14.5  --   --   --   INR  --   --  1.13  --   --   --   HEPARINUNFRC  --   --   --  <0.10*  --  <0.10*  CREATININE 4.10* 3.95*  --   --  3.29*  --   CKTOTAL 69  --   --   --   --   --     Estimated Creatinine Clearance: 17.8 mL/min (by C-G formula based on SCr of 3.29 mg/dL (H)).   Medical History: Past Medical History:  Diagnosis Date  . Anemia   . CHF (congestive heart failure) ([place])   . Chronic back pain   . COPD (chronic obstructive pulmonary disease) ([place])   . Coronary artery disease   . Diabetes mellitus   . DJD (degenerative joint disease)   . Emphysema   . Hypercholesteremia   . Hypertension   . Myocardial infarction   . Obesity   . Obesity hypoventilation syndrome ([place])   . Renal insufficiency   . Scoliosis   . Sleep apnea   . Stroke  ([place])   . TIA (transient ischemic attack)     Medications:  Scheduled:  . enoxaparin (LOVENOX) injection  80 mg Subcutaneous Q24H  . ferrous sulfate  325 mg Oral Q breakfast  . insulin aspart  0-5 Units Subcutaneous QHS  . insulin aspart  0-9 Units Subcutaneous TID WC  . pantoprazole  40 mg Oral Daily  . polyethylene glycol  17 g Oral BID  . traMADol  50 mg Oral Q12H   Infusions:   PRN: acetaminophen, acetaminophen **OR** [DISCONTINUED] acetaminophen, antiseptic oral rinse, bisacodyl, HYDROmorphone (DILAUDID) injection, ondansetron **OR** ondansetron (ZOFRAN) IV  Assessment: 66 year old female currently bedbound from sequela is of stroke 7 bedsores who was brought to the ED per family with complaints of pain in repositioning in bed in pain all over and found to be hypotensive.  Left lower extremity is positive for a small segment of nonocclusive, mobile? thrombosis in the left common femoral vein.  Pharmacy initially consulted to dose IV heparin, then transitioned to Lovenox 1/3. Now adding  Today, 01/30/2016   Heparin level remains undetectable despite heparin re-bolus and rate increase  CBC: Hgb decreased to 8.5, Plts low 160  CKD: SCr 3.29, CrCl ~17 ml/min  No bleeding currently noted  Concurrent plavix noted, PTA aspirin on hold  Goal of Therapy:  Heparin level 0.3-0.7 units/ml Monitor platelets by anticoagulation protocol: Yes   Plan:    Continue Lovenox 1mg /kg SQ q24h for CrCl < 30 ml/min, will need to continue for 5 days overlap with warfarin and therapeutic INR for 48hr  Warfarin 7.5mg  PO x 1 today  Daily PT/INR  Check CBC at least q72h  Monitor for signs/symptoms bleeding  Peggyann Juba, PharmD, BCPS Pager: [phone] 01/30/2016,8:29 AM   Addendum: Provided warfarin education to patient and multiple family members. I stressed the importance of adherence to therapy, INR monitoring and vigilance with watching for signs of bleeding.  Peggyann Juba,  PharmD, BCPS 01/30/2016 2:05 PM

## 2016-01-30 NOTE — Progress Notes (Signed)
Patients daughter [name] called in for an update and did not realise she was supposed to be here at 0800 for a conference with MD. Requested to speak with MD on phone.

## 2016-01-30 NOTE — Consult Note (Signed)
[facility] Face-to-Face Psychiatry Consult   Reason for Consult:  [place] Referring Physician:  Dr. Venetia Constable Patient Identification: Michelle Holmes MRN:  [id number] Principal Diagnosis: Hypotension Diagnosis:   Patient Active Problem List   Diagnosis Date Noted  . Chronic diastolic CHF (congestive heart failure) ([place]) [I50.32] 01/28/2016  . Hypotension [I95.9] 01/28/2016  . Decubitus ulcer of sacral region, stage 2 [L89.152] 01/28/2016  . Dehydration [E86.0] 01/28/2016  . Protein-calorie malnutrition, severe [E43] 01/28/2016  . Emesis [R11.10]   . Goals of care, counseling/discussion [Z71.89]   . Palliative care by specialist [Z51.5]   . AKI (acute kidney injury) ([place]) [N17.9] 12/11/2015  . Anemia in other chronic diseases classified elsewhere [D63.8]   . Acute urinary retention [R33.8]   . Acute kidney injury superimposed on chronic kidney disease ([place]) [N17.9, N18.9]   . Pressure injury of skin [L89.90] 11/24/2015  . Acute metabolic encephalopathy [H37.16]   . Chronic kidney disease (CKD), stage IV (severe) ([place]) [N18.4] 07/06/2015  . Acute CVA (cerebrovascular accident) ([place]) [I63.9]   . Acute encephalopathy [G93.40] 07/04/2015  . Sinus bradycardia [R00.1] 07/04/2015  . Urinary tract infection without hematuria [N39.0] 07/04/2015  . Parietal lobe infarction ([place]) [I63.8] 07/04/2015  . Spastic hemiplegia affecting nondominant side ([place]) [G81.10] 07/01/2015  . Obesity, morbid ([place]) [E66.01] 07/01/2015  . Thrombocytopenia ([place]) [D69.6]   . Acute ischemic VBA thalamic stroke ([place]) [R67.893, I63.22] 01/18/2015  . Acute kidney injury superimposed on CKD ([place]) [N17.9, N18.9]   . Dysarthria [R47.1]   . Lethargy [R53.83]   . Labile blood pressure [R09.89]   . Hyperlipidemia [E78.5]   . History of CVA with residual deficit [I69.30]   . Chronic obstructive pulmonary disease ([place]) [J44.9]   . Hemiparesis, aphasia, and dysphagia as late effect of cerebrovascular accident (CVA) ([place]) [Y10.175,  I69.320, I69.359]   . Acute ischemic stroke ([place]) [I63.9]   . Right hemiplegia ([place]) [G81.91]   . Right sided weakness [R53.1] 01/13/2015  . Abdominal pain [R10.9] 06/20/2014  . Altered mental status [R41.82] 02/08/2012  . OSA on CPAP [G47.33, Z99.89] 12/08/2010  . Morbid obesity ([place]) [E66.01] 12/08/2010  . CAD (coronary artery disease) [I25.10] 12/08/2010  . Cellulitis of pubic region [Z02.585] 12/01/2010  . Uncontrolled type 2 DM with hyperosmolar nonketotic hyperglycemia (H[facility]) [E11.00] 12/01/2010    Total Time spent with patient: 1 hour  Subjective:   Michelle Holmes is a 66 y.o. female patient admitted with hypotension  HPI:  Michelle Holmes is an 66 y.o. female, seen, chart reviewed and case discussed with the family members who were at bedside for this face-to-face psychiatric consultation and evaluation of capacity. Reportedly patient has been suffered cerebro vascular stroke since then she has been required 24/7 care at home. Patient daughter stated that she has been staying next door to her. Reportedly patient has a multiple supportive services. Patient is awake, alert, oriented to place, person. Patient stated that she has been somewhat confused recently. Patient was not able to state accurately had age, and also reportedly difficult to answer simple questions because of memory difficulties and also difficulty with recall. Patient is a poor historian secondary to aphasia and dysphagia secondary to cerebrovascular accident. Patient is able to name most of the family members in her room and able to choose right choices when does not remember or recall especially with the name of the hospital etc. patient has no problem with the concentration able to state names of the week without difficulty and also repeat 3 words given to  her but has difficulty with delayed recall. Patient stated that she wish to go home as she knows she stabilized medically for her hypotension secondary to gastrointestinal  disturbance.  Past Psychiatric History: She has no history of inpatient psychiatric hospitalizations.  Risk to Self: Is patient at risk for suicide?: No Risk to Others:   Prior Inpatient Therapy:   Prior Outpatient Therapy:    Past Medical History:  Past Medical History:  Diagnosis Date  . Anemia   . CHF (congestive heart failure) ([place])   . Chronic back pain   . COPD (chronic obstructive pulmonary disease) ([place])   . Coronary artery disease   . Diabetes mellitus   . DJD (degenerative joint disease)   . Emphysema   . Hypercholesteremia   . Hypertension   . Myocardial infarction   . Obesity   . Obesity hypoventilation syndrome ([place])   . Renal insufficiency   . Scoliosis   . Sleep apnea   . Stroke ([place])   . TIA (transient ischemic attack)     Past Surgical History:  Procedure Laterality Date  . COLON SURGERY    . CORONARY ANGIOPLASTY WITH STENT PLACEMENT     Family History:  Family History  Problem Relation Age of Onset  . Diabetes Mother   . Stroke Maternal Aunt    Family Psychiatric  History: No history of mental illness in the family reported. Social History:  History  Alcohol Use No     History  Drug Use No    Social History   Social History  . Marital status: Divorced    Spouse name: N/A  . Number of children: 5  . Years of education: N/A   Social History Main Topics  . Smoking status: Never Smoker  . Smokeless tobacco: Never Used  . Alcohol use No  . Drug use: No  . Sexual activity: Not Asked   Other Topics Concern  . None   Social History Narrative  . None   Additional Social History:    Allergies:   Allergies  Allergen Reactions  . Penicillins Hives and Swelling    Has patient had a PCN reaction causing immediate rash, facial/tongue/throat swelling, SOB or lightheadedness with hypotension: yes Has patient had a PCN reaction causing severe rash involving mucus membranes or skin necrosis: no Has patient had a PCN reaction that required  hospitalization no Has patient had a PCN reaction occurring within the last 10 years: no If all of the above answers are "NO", then may proceed with Cephalosporin use.     Labs:  Results for orders placed or performed during the hospital encounter of 01/27/16 (from the past 48 hour(s))  APTT     Status: None   Collection Time: 01/28/16  2:44 PM  Result Value Ref Range   aPTT 30 24 - 36 seconds  Protime-INR     Status: None   Collection Time: 01/28/16  2:44 PM  Result Value Ref Range   Prothrombin Time 14.5 11.4 - 15.2 seconds   INR 1.13   Glucose, capillary     Status: Abnormal   Collection Time: 01/28/16  4:55 PM  Result Value Ref Range   Glucose-Capillary 123 (H) 65 - 99 mg/dL  Glucose, capillary     Status: Abnormal   Collection Time: 01/28/16  6:11 PM  Result Value Ref Range   Glucose-Capillary 114 (H) 65 - 99 mg/dL  Glucose, capillary     Status: Abnormal   Collection Time: 01/28/16  9:42  PM  Result Value Ref Range   Glucose-Capillary 120 (H) 65 - 99 mg/dL   Comment 1 Notify RN   Heparin level (unfractionated)     Status: Abnormal   Collection Time: 01/28/16 11:11 PM  Result Value Ref Range   Heparin Unfractionated <0.10 (L) 0.30 - 0.70 IU/mL    Comment:        IF HEPARIN RESULTS ARE BELOW EXPECTED VALUES, AND PATIENT DOSAGE HAS BEEN CONFIRMED, SUGGEST FOLLOW UP TESTING OF ANTITHROMBIN III LEVELS.   CBC     Status: Abnormal   Collection Time: 01/29/16  7:00 AM  Result Value Ref Range   WBC 6.1 4.0 - 10.5 K/uL   RBC 2.98 (L) 3.87 - 5.11 MIL/uL   Hemoglobin 8.5 (L) 12.0 - 15.0 g/dL   HCT 26.7 (L) 36.0 - 46.0 %   MCV 89.6 78.0 - 100.0 fL   MCH 28.5 26.0 - 34.0 pg   MCHC 31.8 30.0 - 36.0 g/dL   RDW 16.1 (H) 11.5 - 15.5 %   Platelets 160 150 - 400 K/uL  Renal function panel     Status: Abnormal   Collection Time: 01/29/16  7:00 AM  Result Value Ref Range   Sodium 142 135 - 145 mmol/L   Potassium 4.6 3.5 - 5.1 mmol/L   Chloride 116 (H) 101 - 111 mmol/L   CO2  18 (L) 22 - 32 mmol/L   Glucose, Bld 97 65 - 99 mg/dL   BUN 76 (H) 6 - 20 mg/dL   Creatinine, Ser 3.29 (H) 0.44 - 1.00 mg/dL   Calcium 8.6 (L) 8.9 - 10.3 mg/dL   Phosphorus 3.6 2.5 - 4.6 mg/dL   Albumin 2.9 (L) 3.5 - 5.0 g/dL   GFR calc non Af Amer 14 (L) >60 mL/min   GFR calc Af Amer 16 (L) >60 mL/min    Comment: (NOTE) The eGFR has been calculated using the CKD EPI equation. This calculation has not been validated in all clinical situations. eGFR's persistently <60 mL/min signify possible Chronic Kidney Disease.    Anion gap 8 5 - 15  Glucose, capillary     Status: None   Collection Time: 01/29/16  7:38 AM  Result Value Ref Range   Glucose-Capillary 83 65 - 99 mg/dL  Heparin level (unfractionated)     Status: Abnormal   Collection Time: 01/29/16  8:47 AM  Result Value Ref Range   Heparin Unfractionated <0.10 (L) 0.30 - 0.70 IU/mL    Comment:        IF HEPARIN RESULTS ARE BELOW EXPECTED VALUES, AND PATIENT DOSAGE HAS BEEN CONFIRMED, SUGGEST FOLLOW UP TESTING OF ANTITHROMBIN III LEVELS.   Glucose, capillary     Status: Abnormal   Collection Time: 01/29/16 11:29 AM  Result Value Ref Range   Glucose-Capillary 152 (H) 65 - 99 mg/dL  Glucose, capillary     Status: Abnormal   Collection Time: 01/29/16 10:55 PM  Result Value Ref Range   Glucose-Capillary 117 (H) 65 - 99 mg/dL   Comment 1 Notify RN    Comment 2 Document in Chart     Current Facility-Administered Medications  Medication Dose Route Frequency Provider Last Rate Last Dose  . acetaminophen (TYLENOL) suppository 650 mg  650 mg Rectal Q6H PRN Gardiner Barefoot, NP      . acetaminophen (TYLENOL) tablet 650 mg  650 mg Oral Q6H PRN Edwin Dada, MD      . antiseptic oral rinse (BIOTENE) solution 15 mL  15  mL Topical PRN Jannette Fogo, NP      . bisacodyl (DULCOLAX) suppository 10 mg  10 mg Rectal Daily PRN Jannette Fogo, NP      . enoxaparin (LOVENOX) injection 80 mg  80 mg Subcutaneous Q24H  Gardiner Barefoot, NP      . ferrous sulfate tablet 325 mg  325 mg Oral Q breakfast Gardiner Barefoot, NP      . HYDROmorphone (DILAUDID) injection 0.5 mg  0.5 mg Intravenous Q4H PRN Jannette Fogo, NP      . insulin aspart (novoLOG) injection 0-5 Units  0-5 Units Subcutaneous QHS Gardiner Barefoot, NP      . insulin aspart (novoLOG) injection 0-9 Units  0-9 Units Subcutaneous TID WC Gardiner Barefoot, NP      . ondansetron (ZOFRAN-ODT) disintegrating tablet 4 mg  4 mg Oral Q6H PRN Jannette Fogo, NP       Or  . ondansetron [facility]) injection 4 mg  4 mg Intravenous Q6H PRN Jannette Fogo, NP      . pantoprazole (PROTONIX) EC tablet 40 mg  40 mg Oral Daily Jannette Fogo, NP   40 mg at 01/30/16 0947  . patient's guide to using coumadin book   Does not apply Once Emiliano Dyer, RPH      . polyethylene glycol (MIRALAX / GLYCOLAX) packet 17 g  17 g Oral BID Gardiner Barefoot, NP      . traMADol Veatrice Bourbon) tablet 50 mg  50 mg Oral Q12H Charlynne Cousins, MD   50 mg at 01/30/16 0947  . Warfarin - Pharmacist Dosing Inpatient   Does not apply q1800 Emiliano Dyer, [facility]        Musculoskeletal: Strength & Muscle Tone: decreased Gait & Station: unable to stand Patient leans: N/A  Psychiatric Specialty Exam: Physical Exam has per history and physical   ROS denied nausea, vomiting, abdominal pain, shortness of breath and chest pain.  No Fever-chills, No Headache, No changes with Vision or hearing, reports vertigo No problems swallowing food or Liquids, No Chest pain, Cough or Shortness of Breath, No Abdominal pain, No Nausea or Vommitting, Bowel movements are regular, No Blood in stool or Urine, No dysuria, No new skin rashes or bruises, No new joints pains-aches,  No new weakness, tingling, numbness in any extremity, No recent weight gain or loss, No polyuria, polydypsia or polyphagia,  A full 10 point Review of Systems was done, except as stated  above, all other Review of Systems were negative.  Blood pressure (!) 94/56, pulse 82, temperature 98.4 F (36.9 C), temperature source Oral, resp. rate 15, height _0  (1.651 m), weight 77.8 kg (171 lb 8.3 oz), SpO2 98 %.Body mass index is 28.54 kg/m.  General Appearance: Casual  Eye Contact:  Good  Speech:  Clear and Coherent  Volume:  Normal  Mood:  Anxious  Affect:  Appropriate and Congruent  Thought Process:  Coherent and Goal Directed  Orientation:  Full (Time, Place, and Person)  Thought Content:  Logical  Suicidal Thoughts:  No  Homicidal Thoughts:  No  Memory:  Immediate;   Good Recent;   Poor Remote;   Fair  Judgement:  Fair  Insight:  Fair  Psychomotor Activity:  Decreased  Concentration:  Concentration: Fair and Attention Span: Poor  Recall:  Poor  Fund of Knowledge:  Fair  Language:  Good  Akathisia:  Negative  Handed:  Right  AIMS (if indicated):  Assets:  Desire for Improvement Housing Leisure Time Resilience Social Support Transportation  ADL's:  Impaired  Cognition:  Impaired,  Moderate  Sleep:        Treatment Plan Summary: 66 years old female with a history of cerebrovascular stroke presented with hypotension. Patient has few cognitive deficits mostly secondary to CVA. Patient has significant social support from the family members and home health care services.  Based on my evaluation today patient meet criteria for capacity to make her own living arrangement but has no capacity to make her own medical decisions, which is agreed by family members at bedside  Case discussed with the psychiatric LCSW and may help the family members to obtain medical care power of attorney if needed.  Daily contact with patient to assess and evaluate symptoms and progress in treatment and Medication management  Disposition: Patient does not meet criteria for psychiatric inpatient admission. Supportive therapy provided about ongoing stressors.  Ambrose Finland, MD 01/30/2016 12:59 PM

## 2016-01-30 NOTE — Progress Notes (Signed)
Pt stated that she didn't want to wear CPAP tonight.  Pt to notify RT if she changes her mind.  RT to monitor and assess as needed.

## 2016-01-30 NOTE — Progress Notes (Addendum)
Daily Progress Note   Patient Name: Michelle Holmes       Date: 01/30/2016 DOB: March 29, 1950  Age: 66 y.o. MRN#: [id number] Attending Physician: Charlynne Cousins, MD Primary Care Physician: Ricke Hey, MD Admit Date: 01/27/2016  Reason for Consultation/Follow-up: Establishing goals of care, Interfamily conflict and Psychosocial/spiritual support; Pt presented with pain and was found to be hypotensive. On admission pt refusing all medication and majority of oral intake and stating a desire to die, however this has been varied dependent on pressure from family. Palliative consulted to help establish goals of care in light of this.   Subjective: Michelle Holmes recognized me when I entered the room and was happy to engage me in conversation. She expressed that she is comfortable with no complaints. She did have a large meal for dinner. She has continued to intermittently refuse medications and continues to express a strong desire to die.   Length of Stay: 2  Current Medications: Scheduled Meds:  . enoxaparin (LOVENOX) injection  80 mg Subcutaneous Q24H  . ferrous sulfate  325 mg Oral Q breakfast  . hydrocerin   Topical BID  . insulin aspart  0-5 Units Subcutaneous QHS  . insulin aspart  0-9 Units Subcutaneous TID WC  . pantoprazole  40 mg Oral Daily  . patient's guide to using coumadin book   Does not apply Once  . polyethylene glycol  17 g Oral BID  . traMADol  50 mg Oral Q12H  . Warfarin - Pharmacist Dosing Inpatient   Does not apply q1800    Continuous Infusions:   PRN Meds: acetaminophen, acetaminophen **OR** [DISCONTINUED] acetaminophen, antiseptic oral rinse, bisacodyl, HYDROmorphone (DILAUDID) injection, ondansetron **OR** ondansetron (ZOFRAN) IV  Physical Exam  -Mucous membranes moist -Abd soft and non-tender when palpated.  -Pt  oriented to person, place, and situation. She is confused on time.           Vital Signs: BP (!) 94/56 (BP Location: Left Arm)   Pulse 82   Temp 98.4 F (36.9 C) (Oral)   Resp 17   Ht _0  (1.651 m)   Wt 77.8 kg (171 lb 8.3 oz)   SpO2 90%   BMI 28.54 kg/m  SpO2: SpO2: 90 % O2 Device: O2 Device: Not Delivered O2 Flow Rate:    Intake/output summary:   Intake/Output Summary (Last 24 hours) at 01/30/16 1517 Last data filed at 01/30/16 0800  Gross per 24 hour  Intake          1081.25 ml  Output              550 ml  Net           531.25 ml   LBM: Last BM Date:  (PTA) Baseline Weight: Weight: 83.9 kg (185 lb) Most recent weight: Weight: 77.8 kg (171 lb 8.3 oz) Flowsheet Rows   Flowsheet Row Most Recent Value  Intake Tab  Referral Department  Hospitalist  Unit at Time of Referral  ICU  Palliative Care Primary Diagnosis  Nephrology  Date Notified  01/28/16  Palliative Care Type  New Palliative care  Reason for referral  Clarify Goals of Care  Date of Admission  01/27/16  Date  first seen by Palliative Care  01/28/16  # of days Palliative referral response time  0 Day(s)  # of days IP prior to Palliative referral  1  Clinical Assessment  Psychosocial & Spiritual Assessment  Palliative Care Outcomes     Patient Active Problem List   Diagnosis Date Noted  . Chronic diastolic CHF (congestive heart failure) ([place]) 01/28/2016  . Hypotension 01/28/2016  . Decubitus ulcer of sacral region, stage 2 01/28/2016  . Dehydration 01/28/2016  . Protein-calorie malnutrition, severe 01/28/2016  . Emesis   . Goals of care, counseling/discussion   . Palliative care by specialist   . AKI (acute kidney injury) ([place]) 12/11/2015  . Anemia in other chronic diseases classified elsewhere   . Acute urinary retention   . Acute kidney injury superimposed on chronic kidney disease ([place])   . Pressure injury of skin 11/24/2015  . Acute metabolic encephalopathy   . Chronic kidney disease (CKD),  stage IV (severe) ([place]) 07/06/2015  . Acute CVA (cerebrovascular accident) ([place])   . Acute encephalopathy 07/04/2015  . Sinus bradycardia 07/04/2015  . Urinary tract infection without hematuria 07/04/2015  . Parietal lobe infarction ([place]) 07/04/2015  . Spastic hemiplegia affecting nondominant side ([place]) 07/01/2015  . Obesity, morbid ([place]) 07/01/2015  . Thrombocytopenia ([place])   . Acute ischemic VBA thalamic stroke ([place]) 01/18/2015  . Acute kidney injury superimposed on CKD ([place])   . Dysarthria   . Lethargy   . Labile blood pressure   . Hyperlipidemia   . History of CVA with residual deficit   . Chronic obstructive pulmonary disease ([place])   . Hemiparesis, aphasia, and dysphagia as late effect of cerebrovascular accident (CVA) ([place])   . Acute ischemic stroke ([place])   . Right hemiplegia ([place])   . Right sided weakness 01/13/2015  . Abdominal pain 06/20/2014  . Altered mental status 02/08/2012  . OSA on CPAP 12/08/2010  . Morbid obesity ([place]) 12/08/2010  . CAD (coronary artery disease) 12/08/2010  . Cellulitis of pubic region 12/01/2010  . Uncontrolled type 2 DM with hyperosmolar nonketotic hyperglycemia ([place]) 12/01/2010    Palliative Care Assessment & Plan   HPI: 66 y.o. female  with past medical history of hx of CVA with residual right hemiparesis, now bedbound, OSA on CPAP, CKD IV-V, baseline Cr 6.3-8, chronic diastolic CHF, IDDM, and hx of colon cancer admitted on 01/27/2016 with generalized pain. Family also reports ongoing vomiting for the past two months. In that time Michelle Holmes has had progressively poor intake and has been refusing medications. On admission she was found to be hypotensive, likely secondary to hypovolemia from dehydration. Also concern for a UTI, and now on antibiotics.  Assessment: I had a long meeting with pt's family on 1/2. They had a number of concerns, which included her vomiting for the past 2 months, decreased oral intake, and refusal to take medications.  The latter two issues they felt was from either (or a combination of) depression and fear of vomiting. Since admission she has had no vomiting episodes, and did eat food at dinner on 1/2.  In term of eating overall, she has had persistently poor oral intake and presented severely dehydrated. Despite no vomiting since admission, her intake has been limited to single meals only when pressured by family.   On 1/3 I spent extensive time with Michelle Holmes and her family discussing goals of care. Please see that note for full discussion. The resultant decision was to change to DNR, and pursue comfort care with  discharge home with Hospice. Extenive family was present for this conversation.   After I left, however, there was significant shift in acceptance of this plan by Michelle Holmes eldest daughter, Michelle Holmes. She initially demanded a reversal of the code status back to Full code (then aggred to resume DNR), and wanted full scope treatment to resume. I had previously explained to the family that if Michelle Holmes was not decisional, her lack of HCPOA paperwork would legally place the majority of her adult children as decision makers (she is divorced from her prior husbands). Michelle Holmes, despite being the eldest child, is not the unilateral Media planner. Furthermore, we have been working under the belief that Michelle Holmes has the capacity to make care decisions, and have involved the extensive family to support consensus and family cohesion.  Today, I again met with Michelle Holmes at her bedside. She continued to assert her desire to die, but had started eating again and agreed to take some of her medication. That said, she refuses medication when there is no family present. After discussing things with the primary physician, we agreed that involving Psychiatry would be beneficial. We need to determine if she has capacity to make decisions prior to having further extensive conversations with the family (who strongly disagree amongst  themselves about care trajectory).   Recommendations/Plan:  DNR  Psychiatry has been consulted to help determine pt capacity  Palliative to follow-up after Psych evaluation to facilitate further goals of care conversations  ADDENDUM: Per Psychiatry note, pt does not have capacity to make medical decisions. He has asked the psychiatric LCSW to help family members obtain medical care power of attorney. Will need to follow-up on this tomorrow, given significant disagreement amongst family regarding goals of care.  Goals of Care and Additional Recommendations:  Limitations on Scope of Treatment: Full Comfort Care  Code Status:  DNR  Prognosis:   Unable to determine. Very dependent on pt's decision to take her medication and have PO intake.   Discharge Planning:  To Be Determined  Care plan was discussed with pt, pt's family, primary physician, care nurse.  Thank you for allowing the Palliative Medicine Team to assist in the care of this patient.       Total Time 90 minutes         Greater than 50%  of this time was spent counseling and coordinating care related to the above assessment and plan.  Charlynn Court, NP Palliative Medicine Team [phone] pager (7a-5p) Team Phone # [phone]

## 2016-01-30 NOTE — Progress Notes (Signed)
Patient refused IV placement by IV team at this time

## 2016-01-30 NOTE — Progress Notes (Signed)
[facility] Liaison visit:  Notified by Sherrill Raring, of patient request for Hospice and [place] services at home after discharge.  Chart and patient information reviewed with Dr. Brigid Re, [place] Director, and hospice eligibility confirmed.  Harmon Pier, CSW, with [facility], spoke with patient and family members yesterday afternoon to initiate education related to hospice philosophy, services and team approach to care.  Family reluctant, but agreed to hospice services at that time.  I came in this morning to talk with family and patient and was told by Charlynn Court, NP (Palliative Care) that there are some questions as to whether patient will be choosing [facility] at this time.  Family is wanting to take over care and make decisions on behalf of patient.     There will be more discussions by Palliative Care, Attending MD, and RNCM before decision is made to bring Hospice services back in for consideration.   Spoke with Alinda Sierras, RNCM this morning who confirmed same.   Please feel free to call me with any questions at [phone] or call Hospice directly at  [phone].  Thank you,  Edyth Gunnels, RN, BSN [facility] Liaison  All hospital liaisons are now on [facility].

## 2016-01-30 NOTE — Progress Notes (Signed)
Patient from ICU. Alert, awake, disoriented to time,place and situation. Oriented to room and environment. Denies pain. Turned and repositioned for comfort. Dressings to sacrum changed. Placed comfortably in bed. Family at bedside. Will continue to monitor.

## 2016-01-31 LAB — PROTIME-INR
INR: 1.1
Prothrombin Time: 14.2 seconds (ref 11.4–15.2)

## 2016-01-31 LAB — GLUCOSE, CAPILLARY
Glucose-Capillary: 98 mg/dL (ref 65–99)
Glucose-Capillary: 116 mg/dL — ABNORMAL HIGH (ref 65–99)
Glucose-Capillary: 121 mg/dL — ABNORMAL HIGH (ref 65–99)
Glucose-Capillary: 130 mg/dL — ABNORMAL HIGH (ref 65–99)

## 2016-01-31 MED ORDER — WARFARIN SODIUM 5 MG PO TABS
7.5000 mg | ORAL_TABLET | Freq: Once | ORAL | Status: AC
  Administered 2016-01-31: 7.5 mg via ORAL
  Filled 2016-01-31: qty 1

## 2016-01-31 MED ORDER — ASPIRIN EC 81 MG PO TBEC: 81.0000 mg | DELAYED_RELEASE_TABLET | Freq: Every day | ORAL | Status: DC

## 2016-01-31 MED ORDER — CLOPIDOGREL BISULFATE 75 MG PO TABS
75.0000 mg | ORAL_TABLET | Freq: Every day | ORAL | Status: DC
  Administered 2016-01-31: 75 mg via ORAL
  Filled 2016-01-31: qty 1

## 2016-01-31 NOTE — Progress Notes (Signed)
Daily Progress Note   Patient Name: Michelle Holmes       Date: 01/31/2016 DOB: 31-Dec-1950  Age: 66 y.o. MRN#: [id number] Attending Physician: Charlynne Cousins, MD Primary Care Physician: Ricke Hey, MD Admit Date: 01/27/2016  Reason for Consultation/Follow-up: Establishing goals of care, Interfamily conflict and Psychosocial/spiritual support; Pt presented with pain and was found to be hypotensive. On admission pt refusing all medication and majority of oral intake and stating a desire to die, however this has been varied dependent on pressure from family. Palliative consulted to help establish goals of care in light of this. This has been complicated as psych has deemed Michelle Holmes "capacity to make her own living arrangement but no capacity to make her own medical decision." Unfortunately Michelle Holmes has 8 children who do not agree on direction of care.   Subjective: Michelle Holmes is smiling and laughing with her family when I come in today. She has no complaints. Length of Stay: 3  Current Medications: Scheduled Meds:  . clopidogrel  75 mg Oral Daily  . enoxaparin (LOVENOX) injection  80 mg Subcutaneous Q24H  . ferrous sulfate  325 mg Oral Q breakfast  . hydrocerin   Topical BID  . insulin aspart  0-5 Units Subcutaneous QHS  . insulin aspart  0-9 Units Subcutaneous TID WC  . pantoprazole  40 mg Oral Daily  . patient's guide to using coumadin book   Does not apply Once  . polyethylene glycol  17 g Oral BID  . traMADol  50 mg Oral Q12H  . warfarin  7.5 mg Oral ONCE-1800  . Warfarin - Pharmacist Dosing Inpatient   Does not apply q1800    Continuous Infusions:   PRN Meds: acetaminophen, acetaminophen **OR** [DISCONTINUED] acetaminophen, antiseptic oral rinse, bisacodyl, HYDROmorphone  (DILAUDID) injection, ondansetron **OR** ondansetron (ZOFRAN) IV  Physical Exam  Constitutional: She appears well-developed.  HENT:  Head: Normocephalic and atraumatic.  Cardiovascular: Normal rate and regular rhythm.   Pulmonary/Chest: Effort normal. No accessory muscle usage. No tachypnea. No respiratory distress.  Abdominal: Normal appearance.  Neurological: She is alert.  Oriented to self. Able to answer questions appropriately.   Nursing note and vitals reviewed.           Vital Signs: BP 120/81 (BP Location: Left Arm)   Pulse 88  Temp 97.8 F (36.6 C) (Oral)   Resp 16   Ht 5\' 5"  (1.651 m)   Wt 77.8 kg (171 lb 8.3 oz)   SpO2 100%   BMI 28.54 kg/m  SpO2: SpO2: 100 % O2 Device: O2 Device: Not Delivered O2 Flow Rate:    Intake/output summary:  Intake/Output Summary (Last 24 hours) at 01/31/16 1408 Last data filed at 01/31/16 0855  Gross per 24 hour  Intake              480 ml  Output              450 ml  Net               30 ml   LBM: Last BM Date:  (PTA) Baseline Weight: Weight: 83.9 kg (185 lb) Most recent weight: Weight: 77.8 kg (171 lb 8.3 oz)       Palliative Assessment/Data:    Flowsheet Rows   Flowsheet Row Most Recent Value  Intake Tab  Referral Department  Hospitalist  Unit at Time of Referral  ICU  Palliative Care Primary Diagnosis  Nephrology  Date Notified  01/28/16  Palliative Care Type  New Palliative care  Reason for referral  Clarify Goals of Care  Date of Admission  01/27/16  Date first seen by Palliative Care  01/28/16  # of days Palliative referral response time  0 Day(s)  # of days IP prior to Palliative referral  1  Clinical Assessment  Psychosocial & Spiritual Assessment  Palliative Care Outcomes      Patient Active Problem List   Diagnosis Date Noted  . Chronic diastolic CHF (congestive heart failure) ([place]) 01/28/2016  . Hypotension 01/28/2016  . Decubitus ulcer of sacral region, stage 2 01/28/2016  . Dehydration  01/28/2016  . Protein-calorie malnutrition, severe 01/28/2016  . Emesis   . Goals of care, counseling/discussion   . Palliative care by specialist   . AKI (acute kidney injury) ([place]) 12/11/2015  . Anemia in other chronic diseases classified elsewhere   . Acute urinary retention   . Acute kidney injury superimposed on chronic kidney disease ([place])   . Pressure injury of skin 11/24/2015  . Acute metabolic encephalopathy   . Chronic kidney disease (CKD), stage IV (severe) ([place]) 07/06/2015  . Acute CVA (cerebrovascular accident) ([place])   . Acute encephalopathy 07/04/2015  . Sinus bradycardia 07/04/2015  . Urinary tract infection without hematuria 07/04/2015  . Parietal lobe infarction ([place]) 07/04/2015  . Spastic hemiplegia affecting nondominant side ([place]) 07/01/2015  . Obesity, morbid ([place]) 07/01/2015  . Thrombocytopenia ([place])   . Acute ischemic VBA thalamic stroke ([place]) 01/18/2015  . Acute kidney injury superimposed on CKD ([place])   . Dysarthria   . Lethargy   . Labile blood pressure   . Hyperlipidemia   . History of CVA with residual deficit   . Chronic obstructive pulmonary disease ([place])   . Hemiparesis, aphasia, and dysphagia as late effect of cerebrovascular accident (CVA) ([place])   . Acute ischemic stroke ([place])   . Right hemiplegia ([place])   . Right sided weakness 01/13/2015  . Abdominal pain 06/20/2014  . Altered mental status 02/08/2012  . OSA on CPAP 12/08/2010  . Morbid obesity ([place]) 12/08/2010  . CAD (coronary artery disease) 12/08/2010  . Cellulitis of pubic region 12/01/2010  . Uncontrolled type 2 DM with hyperosmolar nonketotic hyperglycemia ([place]) 12/01/2010    Palliative Care Assessment & Plan   HPI: 66 y.o.femalewith PMH significant of hx of  CVA with residual right hemiparesis, now bedbound, OSA on CPAP, CKD IV/V with baseline Cr 9.3-9, chronic diastolic CHF, IDDM, and hx of colon canceradmitted on 1/1/2018with generalized pain. Family also reports ongoing vomiting  for the past two months. In that time Michelle Holmes has had progressively poor intake and has been refusing medications. On admission she was found to be hypotensive, likely secondary to hypovolemia from dehydration. Also concern for a UTI, and now on antibiotics. Now taking more medications (although inconsistent at times) and eating more meals (ate lunch today but not breakfast).   Assessment:  Daughter Michelle Holmes at bedside with multiple other family and friends. Michelle Holmes has been eating some and taking most of her medications today. Tells me that she will continue to eat and take her medication. Family at bedside are supportive of continued care. We discussed home care and they decline hospice but are welcoming of palliative care consult to help delineate future GOC. Michelle Holmes says that she knows her mother will die one day and "when God takes her I am okay with that but I don't want to rush it along."   Recommendations/Plan:  Home with home care and outpatient palliative services to continue [place] conversation and assist further conversations if Ms. Messler continues with poor intake and poor medication adherence at home after discharge.   Goals of Care and Additional Recommendations:  Limitations on Scope of Treatment: Avoid Hospitalization and Minimize Medications  Code Status:  DNR  Prognosis:   Unable to determine - dependent on Michelle Holmes willingness to take medications and adequate intake.   Discharge Planning:  Home with Palliative Services  Care plan was discussed with Dr. Aileen Fass  Thank you for allowing the Palliative Medicine Team to assist in the care of this patient.   Total Time 48min Prolonged Time Billed  no       Greater than 50%  of this time was spent counseling and coordinating care related to the above assessment and plan.  Vinie Sill, NP Palliative Medicine Team Pager # [phone] (M-F 8a-5p) Team Phone # [phone] (Nights/Weekends)

## 2016-01-31 NOTE — Progress Notes (Signed)
CM consult for palliative services. This CM met with pt and daughter [name] at bedside to clarify that the plan for DC now is not for hospice but for palliative care services. This was confirmed by both pt and daughter. Amy from [facility] made aware of switch. Per Amy from [facility], Dr. Alyson Ingles was contacted for palliative order and would not provide order. [name] informed and she then told me that she had been working on getting her mother another PCP. It was explained to [name] that palliative services would be unable to be set up if pt did not have a MD to place order. [name] states her understanding and states she will work on getting pt palliative services once she has established pt a new pcp. CM will continue to follow. This CM placed [facility] info on AVS for Michelle Holmes at discharge. Marney Doctor RN,BSN,NCM

## 2016-01-31 NOTE — Progress Notes (Addendum)
[facility] PROGRESS NOTE    Progress Note  Michelle Holmes  EXB:[id number] DOB: 02-06-1950 DOA: 01/27/2016 PCP: Ricke Hey, MD     Brief Narrative:   Michelle Holmes is an 66 y.o. female Past medical history of stroke bedbound was brought into the ED for pain was found to be hypotensive. She's had a prolonged periods of poor oral intake with diarrhea and vomiting, In the setting of diuretics  Assessment/Plan:   Hypotension Secondary to hypovolemia with extreme dehydration. Blood cultures and respiratory cultures have been negative is unlikely sepsis. Urine cultures remain negative to date. Psyq related that she does not have capacity to make medical decision. Awaiting to designate HCPOA.  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  She cont to refused labs  Myalgias: Influenza PCR negative continue conservative management.  Uncontrolled type 2 DM with hyperosmolar nonketotic hyperglycemia ([place]): A1c of 6.6, continue sliding scale insulin Plus sliding scale. She is tolerating oral intake.  Chronic diastolic heart failure: Continue hold diuretic therapy, continue beta blockers.  GERD: Continue PPI.  History of CVA/hypertension: Resume Toprol. Continue Lipitor Plavix.    Anemia of chronic disease: Continue iron supplementation. Baseline hemoglobin 8.5-9.5  Left lower DVT: Cont lovenox and coumadin.  Protein-calorie malnutrition, severe  Goals of care, counseling/discussionPalliative care by specialist Patient and family going back and forth on goals of care. To meet with PMT again.  Constipation:  DVT prophylaxis: heparin Family Communication:none Disposition Plan/Barrier to D/C: home in am Code Status:     Code Status Orders        Start     Ordered   01/28/16 0508  Full code  Continuous     01/28/16 0507    Code Status History    Date Active Date Inactive Code Status Order ID Comments User  Context   12/11/2015  9:43 PM 12/14/2015  8:21 PM Full Code [id number]  Elmarie Shiley, MD Inpatient   11/23/2015  7:42 PM 11/29/2015 10:39 PM Full Code [id number]  Theodis Blaze, MD Inpatient   07/12/2015  8:32 AM 07/14/2015  9:11 PM Full Code [id number]  Rondel Jumbo, PA-C Inpatient   07/04/2015  8:46 PM 07/07/2015  7:55 PM Full Code [id number]  Rise Patience, MD ED   05/06/2015  8:16 AM 05/07/2015  2:38 PM Full Code [id number]  Charolette Forward, MD Inpatient   01/18/2015  5:04 PM 02/07/2015  4:14 PM Full Code [id number]  Lavon Paganini [place], PA-C Inpatient   01/14/2015  1:15 AM 01/18/2015  5:04 PM Full Code [id number]  Reubin Milan, MD Inpatient   06/20/2014  2:08 PM 06/22/2014  1:19 PM Full Code [id number]  Charolette Forward, MD Inpatient   12/02/2010  5:31 AM 12/15/2010 10:47 PM Full Code [id number]  Joellen Jersey, RN Inpatient    Advance Directive Documentation   Flowsheet Row Most Recent Value  Type of Advance Directive  Living will  Pre-existing out of facility DNR order (yellow form or pink MOST form)  No data  "MOST" Form in Place?  No data        IV Access:    Peripheral IV   Procedures and diagnostic studies:   No results found.   Medical Consultants:    None.  Anti-Infectives:   Rocephin  Subjective:    Michelle Holmes She has no new complains.   Objective:    Vitals:   01/30/16 1400 01/30/16 1655 01/30/16 2125  01/31/16 0532  BP:  (!) 155/90 (!) 143/93 120/81  Pulse:  79 85 88  Resp: 17 18 16 16   Temp:  98.7 F (37.1 C) 98.4 F (36.9 C) 97.8 F (36.6 C)  TempSrc:  Oral Oral Oral  SpO2: 90% 96% 100% 100%  Weight:      Height:        Intake/Output Summary (Last 24 hours) at 01/31/16 1222 Last data filed at 01/31/16 0855  Gross per 24 hour  Intake              480 ml  Output              450 ml  Net               30 ml   Filed Weights   01/27/16 2157 01/28/16 0500  Weight: 83.9 kg (185 lb) 77.8 kg (171 lb 8.3 oz)    Exam: General exam: In  no acute distress. Respiratory system: Good air movement and clear to auscultation. Cardiovascular system: S1 & S2 heard, RRR.  Gastrointestinal system: Abdomen is nondistended, soft and nontender.  Extremities: No pedal edema. Psychiatry: Judgement and insight appear normal. Mood & affect appropriate.    Data Reviewed:    Labs: Basic Metabolic Panel:  Recent Labs Lab 01/27/16 2351 01/28/16 0601 01/29/16 0700  NA 139 140 142  K 5.3* 5.0 4.6  CL 110 112* 116*  CO2 19* 19* 18*  GLUCOSE 107* 107* 97  BUN 90* 85* 76*  CREATININE 4.10* 3.95* 3.29*  CALCIUM 8.9 8.5* 8.6*  PHOS  --   --  3.6   GFR Estimated Creatinine Clearance: 17.8 mL/min (by C-G formula based on SCr of 3.29 mg/dL (H)). Liver Function Tests:  Recent Labs Lab 01/27/16 2351 01/28/16 0601 01/29/16 0700  AST 15 12*  --   ALT 10* 11*  --   ALKPHOS 67 58  --   BILITOT 0.7 0.5  --   PROT 6.9 6.4*  --   ALBUMIN 3.5 3.2* 2.9*   No results for input(s): LIPASE, AMYLASE in the last 168 hours. No results for input(s): AMMONIA in the last 168 hours. Coagulation profile  Recent Labs Lab 01/28/16 1444 01/31/16 0417  INR 1.13 1.10    CBC:  Recent Labs Lab 01/27/16 2351 01/28/16 0601 01/29/16 0700  WBC 9.2 8.1 6.1  NEUTROABS 5.5  --   --   HGB 10.2* 9.2* 8.5*  HCT 32.5* 29.1* 26.7*  MCV 87.6 87.9 89.6  PLT PLATELET CLUMPS NOTED ON SMEAR, COUNT APPEARS ADEQUATE 178 160   Cardiac Enzymes:  Recent Labs Lab 01/27/16 2351  CKTOTAL 69   BNP (last 3 results) No results for input(s): PROBNP in the last 8760 hours. CBG:  Recent Labs Lab 01/29/16 2255 01/30/16 1700 01/30/16 2120 01/31/16 0752 01/31/16 1147  GLUCAP 117* 135* 90 98 116*   D-Dimer: No results for input(s): DDIMER in the last 72 hours. Hgb A1c: No results for input(s): HGBA1C in the last 72 hours. Lipid Profile: No results for input(s): CHOL, HDL, LDLCALC, TRIG, CHOLHDL, LDLDIRECT in the last 72 hours. Thyroid function  studies: No results for input(s): TSH, T4TOTAL, T3FREE, THYROIDAB in the last 72 hours.  Invalid input(s): FREET3 Anemia work up: No results for input(s): VITAMINB12, FOLATE, FERRITIN, TIBC, IRON, RETICCTPCT in the last 72 hours. Sepsis Labs:  Recent Labs Lab 01/27/16 2351 01/28/16 0145 01/28/16 0601 01/29/16 0700  WBC 9.2  --  8.1 6.1  LATICACIDVEN  --  1.48  --   --    Microbiology Recent Results (from the past 240 hour(s))  Culture, blood (Routine X 2) w Reflex to ID Panel     Status: None (Preliminary result)   Collection Time: 01/27/16  1:15 AM  Result Value Ref Range Status   Specimen Description BLOOD LEFT ANTECUBITAL  Final   Special Requests IN PEDIATRIC BOTTLE 3ML  Final   Culture   Final    NO GROWTH 2 DAYS Performed at [facility]    Report Status PENDING  Incomplete  Urine culture     Status: Abnormal   Collection Time: 01/28/16 12:35 AM  Result Value Ref Range Status   Specimen Description URINE, CATHETERIZED  Final   Special Requests NONE  Final   Culture MULTIPLE SPECIES PRESENT, SUGGEST RECOLLECTION (A)  Final   Report Status 01/29/2016 FINAL  Final  Respiratory Panel by PCR     Status: None   Collection Time: 01/28/16  4:04 AM  Result Value Ref Range Status   Adenovirus NOT DETECTED NOT DETECTED Final   Coronavirus 229E NOT DETECTED NOT DETECTED Final   Coronavirus HKU1 NOT DETECTED NOT DETECTED Final   Coronavirus NL63 NOT DETECTED NOT DETECTED Final   Coronavirus OC43 NOT DETECTED NOT DETECTED Final   Metapneumovirus NOT DETECTED NOT DETECTED Final   Rhinovirus / Enterovirus NOT DETECTED NOT DETECTED Final   Influenza A NOT DETECTED NOT DETECTED Final   Influenza B NOT DETECTED NOT DETECTED Final   Parainfluenza Virus 1 NOT DETECTED NOT DETECTED Final   Parainfluenza Virus 2 NOT DETECTED NOT DETECTED Final   Parainfluenza Virus 3 NOT DETECTED NOT DETECTED Final   Parainfluenza Virus 4 NOT DETECTED NOT DETECTED Final   Respiratory  Syncytial Virus NOT DETECTED NOT DETECTED Final   Bordetella pertussis NOT DETECTED NOT DETECTED Final   Chlamydophila pneumoniae NOT DETECTED NOT DETECTED Final   Mycoplasma pneumoniae NOT DETECTED NOT DETECTED Final    Comment: Performed at [facility]  MRSA PCR Screening     Status: None   Collection Time: 01/28/16  6:12 AM  Result Value Ref Range Status   MRSA by PCR NEGATIVE NEGATIVE Final    Comment:        The GeneXpert MRSA Assay (FDA approved for NASAL specimens only), is one component of a comprehensive MRSA colonization surveillance program. It is not intended to diagnose MRSA infection nor to guide or monitor treatment for MRSA infections.      Medications:   . enoxaparin (LOVENOX) injection  80 mg Subcutaneous Q24H  . ferrous sulfate  325 mg Oral Q breakfast  . hydrocerin   Topical BID  . insulin aspart  0-5 Units Subcutaneous QHS  . insulin aspart  0-9 Units Subcutaneous TID WC  . pantoprazole  40 mg Oral Daily  . patient's guide to using coumadin book   Does not apply Once  . polyethylene glycol  17 g Oral BID  . traMADol  50 mg Oral Q12H  . Warfarin - Pharmacist Dosing Inpatient   Does not apply q1800   Continuous Infusions:   Time spent: 15 min   LOS: 3 days   Charlynne Cousins  [facility] Pager [phone]  *Please refer to [email or web address], password [email or web address] to get updated schedule on who will round on this patient, as hospitalists switch teams weekly. If 7PM-7AM, please contact night-coverage at [email or web address], password [email or web address] for any overnight needs.  01/31/2016, 12:22 PM

## 2016-01-31 NOTE — Progress Notes (Signed)
ANTICOAGULATION CONSULT NOTE - F/u Consult  Pharmacy Consult for Lovenox/Warfarin Indication: DVT  Allergies  Allergen Reactions  . Penicillins Hives and Swelling    Has patient had a PCN reaction causing immediate rash, facial/tongue/throat swelling, SOB or lightheadedness with hypotension: yes Has patient had a PCN reaction causing severe rash involving mucus membranes or skin necrosis: no Has patient had a PCN reaction that required hospitalization no Has patient had a PCN reaction occurring within the last 10 years: no If all of the above answers are "NO", then may proceed with Cephalosporin use.     Patient Measurements: Height: 5\' 5"  (165.1 cm) Weight: 171 lb 8.3 oz (77.8 kg) IBW/kg (Calculated) : 57 Heparin Dosing Weight: 73.2 kg  Vital Signs: Temp: 97.8 F (36.6 C) (01/05 0532) Temp Source: Oral (01/05 0532) BP: 120/81 (01/05 0532) Pulse Rate: 88 (01/05 0532)  Labs:  Recent Labs  01/28/16 1444 01/28/16 2311 01/29/16 0700 01/29/16 0847 01/31/16 0417  HGB  --   --  8.5*  --   --   HCT  --   --  26.7*  --   --   PLT  --   --  160  --   --   APTT 30  --   --   --   --   LABPROT 14.5  --   --   --  14.2  INR 1.13  --   --   --  1.10  HEPARINUNFRC  --  <0.10*  --  <0.10*  --   CREATININE  --   --  3.29*  --   --     Estimated Creatinine Clearance: 17.8 mL/min (by C-G formula based on SCr of 3.29 mg/dL (H)).   Medical History: Past Medical History:  Diagnosis Date  . Anemia   . CHF (congestive heart failure) ([place])   . Chronic back pain   . COPD (chronic obstructive pulmonary disease) ([place])   . Coronary artery disease   . Diabetes mellitus   . DJD (degenerative joint disease)   . Emphysema   . Hypercholesteremia   . Hypertension   . Myocardial infarction   . Obesity   . Obesity hypoventilation syndrome ([place])   . Renal insufficiency   . Scoliosis   . Sleep apnea   . Stroke ([place])   . TIA (transient ischemic attack)     Medications:  Scheduled:   . enoxaparin (LOVENOX) injection  80 mg Subcutaneous Q24H  . ferrous sulfate  325 mg Oral Q breakfast  . hydrocerin   Topical BID  . insulin aspart  0-5 Units Subcutaneous QHS  . insulin aspart  0-9 Units Subcutaneous TID WC  . pantoprazole  40 mg Oral Daily  . patient's guide to using coumadin book   Does not apply Once  . polyethylene glycol  17 g Oral BID  . traMADol  50 mg Oral Q12H  . Warfarin - Pharmacist Dosing Inpatient   Does not apply q1800   Infusions:   PRN: acetaminophen, acetaminophen **OR** [DISCONTINUED] acetaminophen, antiseptic oral rinse, bisacodyl, HYDROmorphone (DILAUDID) injection, ondansetron **OR** ondansetron (ZOFRAN) IV  Assessment: 66 year old female currently bedbound from sequela is of stroke 7 bedsores who was brought to the ED per family with complaints of pain in repositioning in bed in pain all over and found to be hypotensive.  Left lower extremity is positive for a small segment of nonocclusive, mobile? thrombosis in the left common femoral vein.  Pharmacy initially consulted to dose IV heparin, then  transitioned to Lovenox 1/3. Added warfarin 1/4. Pharmacy provided warfarin education to patient and family members on 1/4.  Today, 01/31/2016   INR 1.10  CBC 1/4: Hgb decreased to 8.5, Plts low 160  CKD: CrCl <20 ml/min  No bleeding currently noted  Concurrent plavix noted, PTA aspirin on hold  Goal of Therapy:  Heparin level 0.3-0.7 units/ml Monitor platelets by anticoagulation protocol: Yes   Plan:    Continue Lovenox 1mg /kg SQ q24h for CrCl < 30 ml/min, will need to continue for 5 days overlap with warfarin and therapeutic INR for 48hr  Warfarin 7.5mg  PO x 1 today  Daily PT/INR  Check CBC at least q72h  Monitor for signs/symptoms bleeding  Hershal Coria, PharmD, BCPS Pager: [phone] 01/31/2016 12:36 PM

## 2016-01-31 NOTE — Progress Notes (Signed)
Pt declined cpap again tonight.  Pt was encouraged to call should she change her mind.  Cpap machine remains in her room on standby.

## 2016-02-01 DIAGNOSIS — R531 Weakness: Secondary | ICD-10-CM

## 2016-02-01 DIAGNOSIS — Z23 Encounter for immunization: Secondary | ICD-10-CM | POA: Diagnosis not present

## 2016-02-01 LAB — CBC
HCT: 26.9 % — ABNORMAL LOW (ref 36.0–46.0)
Hemoglobin: 8.7 g/dL — ABNORMAL LOW (ref 12.0–15.0)
MCH: 28.7 pg (ref 26.0–34.0)
MCHC: 32.3 g/dL (ref 30.0–36.0)
MCV: 88.8 fL (ref 78.0–100.0)
Platelets: 166 10*3/uL (ref 150–400)
RBC: 3.03 MIL/uL — ABNORMAL LOW (ref 3.87–5.11)
RDW: 16.3 % — ABNORMAL HIGH (ref 11.5–15.5)
WBC: 6.6 10*3/uL (ref 4.0–10.5)

## 2016-02-01 LAB — PROTIME-INR
INR: 1.16
Prothrombin Time: 14.9 seconds (ref 11.4–15.2)

## 2016-02-01 LAB — GLUCOSE, CAPILLARY
Glucose-Capillary: 84 mg/dL (ref 65–99)
Glucose-Capillary: 106 mg/dL — ABNORMAL HIGH (ref 65–99)

## 2016-02-01 MED ORDER — WARFARIN SODIUM 5 MG PO TABS: 5.0000 mg | ORAL_TABLET | Freq: Every day | ORAL | 0 refills | Status: DC

## 2016-02-01 MED ORDER — ENOXAPARIN SODIUM 80 MG/0.8ML ~~LOC~~ SOLN: 80.0000 mg | SUBCUTANEOUS | 0 refills | Status: DC

## 2016-02-01 MED ORDER — WARFARIN SODIUM 5 MG PO TABS: 7.5000 mg | ORAL_TABLET | Freq: Once | ORAL | Status: DC

## 2016-02-01 NOTE — Progress Notes (Signed)
ANTICOAGULATION CONSULT NOTE - F/u Consult  Pharmacy Consult for Lovenox/Warfarin Indication: DVT  Allergies  Allergen Reactions  . Penicillins Hives and Swelling    Has patient had a PCN reaction causing immediate rash, facial/tongue/throat swelling, SOB or lightheadedness with hypotension: yes Has patient had a PCN reaction causing severe rash involving mucus membranes or skin necrosis: no Has patient had a PCN reaction that required hospitalization no Has patient had a PCN reaction occurring within the last 10 years: no If all of the above answers are "NO", then may proceed with Cephalosporin use.     Patient Measurements: Height: 5\' 5"  (165.1 cm) Weight: 171 lb 8.3 oz (77.8 kg) IBW/kg (Calculated) : 57 Heparin Dosing Weight: 73.2 kg  Vital Signs: Temp: 98.1 F (36.7 C) (01/06 0514) Temp Source: Oral (01/06 0514) BP: 135/93 (01/06 0514) Pulse Rate: 81 (01/06 0514)  Labs:  Recent Labs  01/29/16 0847 01/31/16 0417 02/01/16 0457  HGB  --   --  8.7*  HCT  --   --  26.9*  PLT  --   --  166  LABPROT  --  14.2 14.9  INR  --  1.10 1.16  HEPARINUNFRC <0.10*  --   --     Estimated Creatinine Clearance: 17.8 mL/min (by C-G formula based on SCr of 3.29 mg/dL (H)).   Medical History: Past Medical History:  Diagnosis Date  . Anemia   . CHF (congestive heart failure) ([place])   . Chronic back pain   . COPD (chronic obstructive pulmonary disease) ([place])   . Coronary artery disease   . Diabetes mellitus   . DJD (degenerative joint disease)   . Emphysema   . Hypercholesteremia   . Hypertension   . Myocardial infarction   . Obesity   . Obesity hypoventilation syndrome ([place])   . Renal insufficiency   . Scoliosis   . Sleep apnea   . Stroke ([place])   . TIA (transient ischemic attack)     Medications:  Scheduled:  . clopidogrel  75 mg Oral Daily  . enoxaparin (LOVENOX) injection  80 mg Subcutaneous Q24H  . ferrous sulfate  325 mg Oral Q breakfast  . hydrocerin    Topical BID  . insulin aspart  0-5 Units Subcutaneous QHS  . insulin aspart  0-9 Units Subcutaneous TID WC  . pantoprazole  40 mg Oral Daily  . patient's guide to using coumadin book   Does not apply Once  . polyethylene glycol  17 g Oral BID  . traMADol  50 mg Oral Q12H  . Warfarin - Pharmacist Dosing Inpatient   Does not apply q1800   Infusions:   PRN: acetaminophen, acetaminophen **OR** [DISCONTINUED] acetaminophen, antiseptic oral rinse, bisacodyl, HYDROmorphone (DILAUDID) injection, ondansetron **OR** ondansetron (ZOFRAN) IV  Assessment: 66 year old female currently bedbound from sequela is of stroke 7 bedsores who was brought to the ED per family with complaints of pain in repositioning in bed in pain all over and found to be hypotensive.  Left lower extremity is positive for a small segment of nonocclusive, mobile? thrombosis in the left common femoral vein.  Pharmacy initially consulted to dose IV heparin, then transitioned to Lovenox 1/3. Added warfarin 1/4. Pharmacy provided warfarin education to patient and family members on 1/4.  Today, 02/01/2016   INR 1.16  CBC: Hgb low but stable at 8.7, Plts low 166  CKD: CrCl <20 ml/min  No bleeding currently noted  Concurrent plavix noted, PTA aspirin on hold  Goal of Therapy:  INR  2-3 Monitor platelets by anticoagulation protocol: Yes   Plan:    Continue Lovenox 1mg /kg SQ q24h for CrCl < 30 ml/min, will need to continue for 5 days overlap with warfarin and therapeutic INR for 48hr  Warfarin 7.5mg  PO x 1 today  Daily PT/INR  Check CBC at least q72h  Monitor for signs/symptoms bleeding  Peggyann Juba, PharmD, BCPS Pager: [phone] 02/01/2016 7:41 AM

## 2016-02-01 NOTE — Discharge Summary (Signed)
Physician Discharge Summary  Michelle Holmes WGY:[id number] DOB: 05-16-1950 DOA: 01/27/2016  PCP: Ricke Hey, MD  Admit date: 01/27/2016 Discharge date: 02/01/2016  Admitted From: home Disposition:  Home  Recommendations for Outpatient Follow-up:  1. Follow up with PCP in 1-2 weeks fr INR check  Home Health:yes Equipment/Devices:none  Discharge Condition:guarded CODE STATUS:DNR Diet recommendation: Heart Healthy   Brief/Interim Summary: Per Dr. Murlean Hark: Michelle Holmes is a 66 y.o. female with a past medical history significant for hx of CVA with residual right hemiparesis, now bedbound, OSA on CPAP, CKD IV-V, baseline Cr 7.7-9, chronic diastolic CHF, and IDDM who presents with pain with turns.  Caveat that all history is collected from family as patient is unable to provide her own history.    Family relate that the patient was "aching all over today".  Family were trying to rotate the patient frequently off her bed sores, but today she kept calling out "ow ow ow" every time she was touched, and stated that she hurt "all over".  She did have a loose stool today.  She has been having emesis for a few weeks.  Because of emesis, she has not been taking medications, not been eating, not been drinking.  No noted fever, chills, sweats.  No noted cough, rhinorrhea.  Discharge Diagnoses:  Principal Problem:   Hypotension Active Problems:   Uncontrolled type 2 DM with hyperosmolar nonketotic hyperglycemia (HCC)   OSA on CPAP   Morbid obesity (HCC)   Right sided weakness   Right hemiplegia (HCC)   Hemiparesis, aphasia, and dysphagia as late effect of cerebrovascular accident (CVA) ([place])   Urinary tract infection without hematuria   Chronic kidney disease (CKD), stage IV (severe) (HCC)   Anemia in other chronic diseases classified elsewhere   AKI (acute kidney injury) (HCC)   Chronic diastolic CHF (congestive heart failure) (HCC)   Decubitus ulcer of sacral region, stage 2    Dehydration   Protein-calorie malnutrition, severe   Emesis   Goals of care, counseling/discussion   Palliative care by specialist  Hypotension Secondary to hypovolemia with extreme dehydration. Blood cultures and respiratory cultures have been negative is unlikely sepsis. Urine cultures remain negative to date. Psyq related that she does not have capacity to make medical decision. Family decided to take her home after meeting with PMT will like to minimize admission.  Acute kidney injury onChronic kidney disease stage 4-5: Resolved with hydration creatinine returned to baseline..    Myalgias: Influenza PCR negative continue conservative management.  Uncontrolled type 2 DM with hyperosmolar nonketotic hyperglycemia ([place]): No changes made to her medication.  Chronic diastolic heart failure: No changes made to her medication.  GERD: Continue PPI.  History of CVA/hypertension: Resume Toprol. Aspirin held as she is now on warfarin. Continue Lipitor Plavix.    Anemia of chronic disease: Continue iron supplementation. Baseline hemoglobin 8.5-9.5  Left lower DVT: Cont lovenox and coumadin.  Protein-calorie malnutrition, severe  Goals of care, counseling/discussionPalliative care by specialist After long discussion family decided to go home and have outpatient palliative service, she continues to have poor oral intake and poor medication compliance. It was explained to her and her family that she needs to be compliant with her Coumadin and Lovenox, she will have an INR checked by home health on 1/8 2018 and will be sent his primary care doctor. The family will like to limit hospitalizations minimize medications. She has in deemed that she does not have capacity to make medical decisions. She is a  DO NOT RESUSCITATE.  Constipation: Miralax  Discharge Instructions  Discharge Instructions    Diet - low sodium heart healthy    Complete by:  As directed    Increase  activity slowly    Complete by:  As directed      Allergies as of 02/01/2016      Reactions   Penicillins Hives, Swelling   Has patient had a PCN reaction causing immediate rash, facial/tongue/throat swelling, SOB or lightheadedness with hypotension: yes Has patient had a PCN reaction causing severe rash involving mucus membranes or skin necrosis: no Has patient had a PCN reaction that required hospitalization no Has patient had a PCN reaction occurring within the last 10 years: no If all of the above answers are "NO", then may proceed with Cephalosporin use.      Medication List    STOP taking these medications   aspirin 325 MG EC tablet   ferrous sulfate 325 (65 FE) MG tablet   ondansetron 4 MG tablet Commonly known as:  ZOFRAN   Oxycodone HCl 20 MG Tabs   pantoprazole 40 MG tablet Commonly known as:  PROTONIX     TAKE these medications   amLODipine 5 MG tablet Commonly known as:  NORVASC Take 5 mg by mouth at bedtime.   atorvastatin 80 MG tablet Commonly known as:  LIPITOR Take 1 tablet (80 mg total) by mouth daily at 6 PM. What changed:  how much to take   clopidogrel 75 MG tablet Commonly known as:  PLAVIX Take 1 tablet (75 mg total) by mouth daily.   enoxaparin 80 MG/0.8ML injection Commonly known as:  LOVENOX Inject 0.8 mLs (80 mg total) into the skin daily.   furosemide 20 MG tablet Commonly known as:  LASIX Take 1 tablet (20 mg total) by mouth every Monday, Wednesday, and Friday. Please start first dose on Wednesday 11/22   HYDROcodone-acetaminophen 5-325 MG tablet Commonly known as:  NORCO/VICODIN Take 1 tablet by mouth every 4 (four) hours as needed for severe pain.   Insulin Glargine 100 UNIT/ML Solostar Pen Commonly known as:  LANTUS SOLOSTAR Inject 10 Units into the skin at bedtime.   LYRICA 50 MG capsule Generic drug:  pregabalin Take 1 capsule (50 mg total) by mouth daily. What changed:  when to take this   metoprolol succinate 50 MG 24  hr tablet Commonly known as:  TOPROL-XL Take 50 mg by mouth daily. Take with or immediately following a meal.   polyethylene glycol powder powder Commonly known as:  MIRALAX Take 17 g by mouth daily.   senna-docusate 8.6-50 MG tablet Commonly known as:  Senokot-S Take 1 tablet by mouth at bedtime. Do not take if diarrhea   sodium bicarbonate 650 MG tablet Take 1 tablet (650 mg total) by mouth daily.   traMADol 50 MG tablet Commonly known as:  ULTRAM take 1 TABLET BY MOUTH EVERY 12 hours   warfarin 5 MG tablet Commonly known as:  COUMADIN Take 1 tablet (5 mg total) by mouth daily.      Follow-up [place] Follow up.   Contact information: [address] [place] [address] [phone]        [facility] Follow [facility].   Specialty:  Hospice and Palliative Medicine Contact information: [place] [address] [phone]          Allergies  Allergen Reactions  . Penicillins Hives and Swelling    Has  patient had a PCN reaction causing immediate rash, facial/tongue/throat swelling, SOB or lightheadedness with hypotension: yes Has patient had a PCN reaction causing severe rash involving mucus membranes or skin necrosis: no Has patient had a PCN reaction that required hospitalization no Has patient had a PCN reaction occurring within the last 10 years: no If all of the above answers are "NO", then may proceed with Cephalosporin use.     Consultations:  PMT   Procedures/Studies: Ct Abdomen Pelvis Wo Contrast  Result Date: 01/15/2016 CLINICAL DATA:  Abdominal pain, nausea and vomiting starting this morning EXAM: CT ABDOMEN AND PELVIS WITHOUT CONTRAST TECHNIQUE: Multidetector CT imaging of the abdomen and pelvis was performed following the standard protocol without IV contrast. COMPARISON:  2/23/ 17 FINDINGS: Lower chest: Lung bases are normal. Hepatobiliary: Unenhanced liver shows no biliary  ductal dilatation. The patient is status post cholecystectomy. There are artifacts from patient's forearms and hands overlying the upper abdomen. Pancreas: Unenhanced pancreas with normal appearance. Spleen: Unenhanced spleen is normal. Adrenals/Urinary Tract: No left adrenal gland mass. Stable probable adenoma right adrenal gland measures 1.4 cm. Again noted bilateral multiple calcified renal calculi. At least 3 right renal calculi the largest in midpole measures 4 mm. Calcified calculus in midpole of the left kidney measures 5 mm. Axial image 35 there is 2.6 mm calcified calculus in proximal right ureter at the level of upper endplate of L3 vertebral body. Minimal right hydronephrosis. No left ureteral calculi. At least 1 or 2 calcified calculi are noted within right posterior aspect of the urinary bladder the largest measures 2.7 mm. There is a Foley catheter within decompressed urinary bladder. Stomach/Bowel: No small bowel obstruction. O postsurgical changes are noted in right colon. No evidence of colonic obstruction. Moderate gas and stool noted within transverse colon. Moderate gas noted in proximal sigmoid colon. Abundant stool noted within rectum. The rectum measures 8.5 cm in diameter suspicious for mild fecal impaction. Vascular/Lymphatic: Mild atherosclerotic calcifications of abdominal aorta and iliac arteries. No aortic aneurysm. Reproductive: The patient is status post hysterectomy. Other: No ascites or free abdominal air. Musculoskeletal: There is significant dextroscoliosis lower thoracic and lumbar spine. Extensive degenerative changes are noted thoracolumbar spine. IMPRESSION: 1. There is minimal right hydronephrosis and proximal right hydroureter. Bilateral nonobstructive nephrolithiasis. 2. There is 2.6 mm calcified calculus in proximal right ureter at the level of upper endplate of L3 vertebral body. 3. Small calcified calculi are noted right posterior aspect of empty urinary bladder the  largest measures 2.7 mm. There is a Foley catheter within decompressed urinary bladder. 4. No small bowel or colonic obstruction. Postsurgical changes are noted right colon no evidence of anastomotic stricture. 5. Abundant stool noted within rectum. The rectum measures 8.5 cm in diameter suspicious for fecal impaction. 6. Degenerative changes and significant dextroscoliosis lower thoracic and lumbar spine. Electronically Signed   By: Lahoma Crocker M.D.   On: 01/15/2016 13:01   Dg Abd 1 View  Result Date: 01/28/2016 CLINICAL DATA:  Vomiting. EXAM: ABDOMEN - 1 VIEW COMPARISON:  Scout image for CT scan dated 01/15/2016 FINDINGS: There is a mottled but of air in the stomach as well as in the colon but there are no dilated loops of bowel. Moderate stool in the rectum. Surgical clips in the pelvis in the right mid and upper abdomen. Severe chronic thoracolumbar scoliosis. IMPRESSION: Benign-appearing abdomen. Electronically Signed   By: Lorriane Shire M.D.   On: 01/28/2016 17:19   Dg Chest Port 1 View  Result Date: 01/27/2016  CLINICAL DATA:  Hypotension EXAM: PORTABLE CHEST 1 VIEW COMPARISON:  12/27/2015 FINDINGS: The patient is markedly rotated. Right CP angle non included. Mild atelectasis at the left base. No consolidation or gross E fusion. Stable enlarged cardiomediastinal allowing for rotation. No pneumothorax. IMPRESSION: Mild streaky atelectasis at the left lung base. Otherwise no acute interval changes allowing for patient rotation. Electronically Signed   By: Donavan Foil M.D.   On: 01/27/2016 22:42     Subjective: No complains happy to go home.  Discharge Exam: Vitals:   01/31/16 1958 02/01/16 0514  BP: 140/83 (!) 135/93  Pulse: 83 81  Resp: 16 14  Temp: 98.6 F (37 C) 98.1 F (36.7 C)   Vitals:   01/31/16 0532 01/31/16 1721 01/31/16 1958 02/01/16 0514  BP: 120/81 (!) 143/73 140/83 (!) 135/93  Pulse: 88 89 83 81  Resp: 16 16 16 14   Temp: 97.8 F (36.6 C) 98 F (36.7 C) 98.6 F (37  C) 98.1 F (36.7 C)  TempSrc: Oral Oral Oral Oral  SpO2: 100% 100% 100% 100%  Weight:      Height:        General: Pt is alert, awake, not in acute distress Cardiovascular: RRR, S1/S2 +, no rubs, no gallops Respiratory: CTA bilaterally, no wheezing, no rhonchi Abdominal: Soft, NT, ND, bowel sounds + Extremities: no edema, no cyanosis    The results of significant diagnostics from this hospitalization (including imaging, microbiology, ancillary and laboratory) are listed below for reference.     Microbiology: Recent Results (from the past 240 hour(s))  Culture, blood (Routine X 2) w Reflex to ID Panel     Status: None (Preliminary result)   Collection Time: 01/27/16  1:15 AM  Result Value Ref Range Status   Specimen Description BLOOD LEFT ANTECUBITAL  Final   Special Requests IN PEDIATRIC BOTTLE 3ML  Final   Culture   Final    NO GROWTH 4 DAYS Performed at [facility]    Report Status PENDING  Incomplete  Urine culture     Status: Abnormal   Collection Time: 01/28/16 12:35 AM  Result Value Ref Range Status   Specimen Description URINE, CATHETERIZED  Final   Special Requests NONE  Final   Culture MULTIPLE SPECIES PRESENT, SUGGEST RECOLLECTION (A)  Final   Report Status 01/29/2016 FINAL  Final  Respiratory Panel by PCR     Status: None   Collection Time: 01/28/16  4:04 AM  Result Value Ref Range Status   Adenovirus NOT DETECTED NOT DETECTED Final   Coronavirus 229E NOT DETECTED NOT DETECTED Final   Coronavirus HKU1 NOT DETECTED NOT DETECTED Final   Coronavirus NL63 NOT DETECTED NOT DETECTED Final   Coronavirus OC43 NOT DETECTED NOT DETECTED Final   Metapneumovirus NOT DETECTED NOT DETECTED Final   Rhinovirus / Enterovirus NOT DETECTED NOT DETECTED Final   Influenza A NOT DETECTED NOT DETECTED Final   Influenza B NOT DETECTED NOT DETECTED Final   Parainfluenza Virus 1 NOT DETECTED NOT DETECTED Final   Parainfluenza Virus 2 NOT DETECTED NOT DETECTED Final    Parainfluenza Virus 3 NOT DETECTED NOT DETECTED Final   Parainfluenza Virus 4 NOT DETECTED NOT DETECTED Final   Respiratory Syncytial Virus NOT DETECTED NOT DETECTED Final   Bordetella pertussis NOT DETECTED NOT DETECTED Final   Chlamydophila pneumoniae NOT DETECTED NOT DETECTED Final   Mycoplasma pneumoniae NOT DETECTED NOT DETECTED Final    Comment: Performed at [facility]  MRSA PCR Screening  Status: None   Collection Time: 01/28/16  6:12 AM  Result Value Ref Range Status   MRSA by PCR NEGATIVE NEGATIVE Final    Comment:        The GeneXpert MRSA Assay (FDA approved for NASAL specimens only), is one component of a comprehensive MRSA colonization surveillance program. It is not intended to diagnose MRSA infection nor to guide or monitor treatment for MRSA infections.      Labs: BNP (last 3 results) No results for input(s): BNP in the last 8760 hours. Basic Metabolic Panel:  Recent Labs Lab 01/27/16 2351 01/28/16 0601 01/29/16 0700  NA 139 140 142  K 5.3* 5.0 4.6  CL 110 112* 116*  CO2 19* 19* 18*  GLUCOSE 107* 107* 97  BUN 90* 85* 76*  CREATININE 4.10* 3.95* 3.29*  CALCIUM 8.9 8.5* 8.6*  PHOS  --   --  3.6   Liver Function Tests:  Recent Labs Lab 01/27/16 2351 01/28/16 0601 01/29/16 0700  AST 15 12*  --   ALT 10* 11*  --   ALKPHOS 67 58  --   BILITOT 0.7 0.5  --   PROT 6.9 6.4*  --   ALBUMIN 3.5 3.2* 2.9*   No results for input(s): LIPASE, AMYLASE in the last 168 hours. No results for input(s): AMMONIA in the last 168 hours. CBC:  Recent Labs Lab 01/27/16 2351 01/28/16 0601 01/29/16 0700 02/01/16 0457  WBC 9.2 8.1 6.1 6.6  NEUTROABS 5.5  --   --   --   HGB 10.2* 9.2* 8.5* 8.7*  HCT 32.5* 29.1* 26.7* 26.9*  MCV 87.6 87.9 89.6 88.8  PLT PLATELET CLUMPS NOTED ON SMEAR, COUNT APPEARS ADEQUATE 178 160 166   Cardiac Enzymes:  Recent Labs Lab 01/27/16 2351  CKTOTAL 69   BNP: Invalid input(s): POCBNP CBG:  Recent Labs Lab  01/31/16 1147 01/31/16 1715 01/31/16 1956 02/01/16 0732 02/01/16 1136  GLUCAP 116* 121* 130* 84 106*   D-Dimer No results for input(s): DDIMER in the last 72 hours. Hgb A1c No results for input(s): HGBA1C in the last 72 hours. Lipid Profile No results for input(s): CHOL, HDL, LDLCALC, TRIG, CHOLHDL, LDLDIRECT in the last 72 hours. Thyroid function studies No results for input(s): TSH, T4TOTAL, T3FREE, THYROIDAB in the last 72 hours.  Invalid input(s): FREET3 Anemia work up No results for input(s): VITAMINB12, FOLATE, FERRITIN, TIBC, IRON, RETICCTPCT in the last 72 hours. Urinalysis    Component Value Date/Time   COLORURINE YELLOW 01/28/2016 0035   APPEARANCEUR CLEAR 01/28/2016 0035   LABSPEC 1.013 01/28/2016 0035   PHURINE 5.0 01/28/2016 0035   GLUCOSEU NEGATIVE 01/28/2016 0035   HGBUR SMALL (A) 01/28/2016 0035   BILIRUBINUR NEGATIVE 01/28/2016 0035   KETONESUR NEGATIVE 01/28/2016 0035   PROTEINUR NEGATIVE 01/28/2016 0035   UROBILINOGEN 0.2 06/20/2014 0808   NITRITE NEGATIVE 01/28/2016 0035   LEUKOCYTESUR LARGE (A) 01/28/2016 0035   Sepsis Labs Invalid input(s): PROCALCITONIN,  WBC,  LACTICIDVEN Microbiology Recent Results (from the past 240 hour(s))  Culture, blood (Routine X 2) w Reflex to ID Panel     Status: None (Preliminary result)   Collection Time: 01/27/16  1:15 AM  Result Value Ref Range Status   Specimen Description BLOOD LEFT ANTECUBITAL  Final   Special Requests IN PEDIATRIC BOTTLE 3ML  Final   Culture   Final    NO GROWTH 4 DAYS Performed at [facility]    Report Status PENDING  Incomplete  Urine culture  Status: Abnormal   Collection Time: 01/28/16 12:35 AM  Result Value Ref Range Status   Specimen Description URINE, CATHETERIZED  Final   Special Requests NONE  Final   Culture MULTIPLE SPECIES PRESENT, SUGGEST RECOLLECTION (A)  Final   Report Status 01/29/2016 FINAL  Final  Respiratory Panel by PCR     Status: None   Collection Time:  01/28/16  4:04 AM  Result Value Ref Range Status   Adenovirus NOT DETECTED NOT DETECTED Final   Coronavirus 229E NOT DETECTED NOT DETECTED Final   Coronavirus HKU1 NOT DETECTED NOT DETECTED Final   Coronavirus NL63 NOT DETECTED NOT DETECTED Final   Coronavirus OC43 NOT DETECTED NOT DETECTED Final   Metapneumovirus NOT DETECTED NOT DETECTED Final   Rhinovirus / Enterovirus NOT DETECTED NOT DETECTED Final   Influenza A NOT DETECTED NOT DETECTED Final   Influenza B NOT DETECTED NOT DETECTED Final   Parainfluenza Virus 1 NOT DETECTED NOT DETECTED Final   Parainfluenza Virus 2 NOT DETECTED NOT DETECTED Final   Parainfluenza Virus 3 NOT DETECTED NOT DETECTED Final   Parainfluenza Virus 4 NOT DETECTED NOT DETECTED Final   Respiratory Syncytial Virus NOT DETECTED NOT DETECTED Final   Bordetella pertussis NOT DETECTED NOT DETECTED Final   Chlamydophila pneumoniae NOT DETECTED NOT DETECTED Final   Mycoplasma pneumoniae NOT DETECTED NOT DETECTED Final    Comment: Performed at [facility]  MRSA PCR Screening     Status: None   Collection Time: 01/28/16  6:12 AM  Result Value Ref Range Status   MRSA by PCR NEGATIVE NEGATIVE Final    Comment:        The GeneXpert MRSA Assay (FDA approved for NASAL specimens only), is one component of a comprehensive MRSA colonization surveillance program. It is not intended to diagnose MRSA infection nor to guide or monitor treatment for MRSA infections.      Time coordinating discharge: Over 30 minutes  SIGNED:   Charlynne Cousins, MD  [facility] 02/01/2016, 11:53 AM Pager   If 7PM-7AM, please contact night-coverage [email or web address] Password [email or web address]

## 2016-02-01 NOTE — Care Management Note (Signed)
Case Management Note  Patient Details  Name: Michelle Holmes MRN: [id number] Date of Birth: 06/30/1950  Subjective/Objective:    Hypotension, DM                Action/Plan: Discharge Planning: AVS reviewed:   NCM spoke to Sharman Cheek # [phone] at bedside. Nephew lives in the home. Dtrs states she lives next door and assist with her mother's care also. She has a [place] CAP aide that comes from [facility]. Aide comes everyday for a total of 40 hours per week. Dtr states pt has hospital bed and hoyer lift at home. She is requesting a gel overlay for bed. Contacted [facility] DME rep with new order. Offered choice for HH/list provided. Dtr states she wanted to cancel service with [facility] and requested [facility]. Contacted [facility] Liaison with new referral. Contacted [place] Liaison to make aware.    PCP Ricke Hey MD  Expected Discharge Date:  02/01/2015              Expected Discharge Plan:  [place]  In-House Referral:  NA  Discharge planning Services  CM Consult  Post Acute Care Choice:  Home Health Choice offered to:  Adult Children  DME Arranged:  Gel overlay DME Agency:  [place] Arranged:  RN, PT, OT, Nurse's Aide [place] Agency:  [place]  Status of Service:  Completed, signed off  If discussed at [place] of Stay Meetings, dates discussed:    Additional Comments:  Erenest Rasher, RN 02/01/2016, 2:47 PM

## 2016-02-01 NOTE — Care Management Important Message (Signed)
Important Message  Patient Details  Name: Michelle Holmes MRN: [id number] Date of Birth: 1950/03/16   Medicare Important Message Given:  Yes    Erenest Rasher, RN 02/01/2016, 1:35 PM

## 2016-02-01 NOTE — Progress Notes (Signed)
Patient discharged to home, all discharged medications and instructions reviewed with patient's daughter and questions answered.  Patient to be transported home via [place]. Pt daughetr states she is familiar and comfortable with Lovenox injections which are prescribed at d/c. States has previously administered Lovenox shots in the past.

## 2016-02-02 LAB — CULTURE, BLOOD (ROUTINE X 2): Culture: NO GROWTH

## 2016-02-03 ENCOUNTER — Ambulatory Visit: Payer: Self-pay | Admitting: Physician Assistant

## 2016-02-03 ENCOUNTER — Telehealth: Payer: Self-pay | Admitting: Nurse Practitioner

## 2016-02-03 DIAGNOSIS — E119 Type 2 diabetes mellitus without complications: Secondary | ICD-10-CM | POA: Diagnosis not present

## 2016-02-03 DIAGNOSIS — I10 Essential (primary) hypertension: Secondary | ICD-10-CM | POA: Diagnosis not present

## 2016-02-03 DIAGNOSIS — S0003XD Contusion of scalp, subsequent encounter: Secondary | ICD-10-CM | POA: Diagnosis not present

## 2016-02-03 DIAGNOSIS — M545 Low back pain: Secondary | ICD-10-CM | POA: Diagnosis not present

## 2016-02-03 NOTE — Telephone Encounter (Signed)
Left message for patient to call back  

## 2016-02-04 DIAGNOSIS — I959 Hypotension, unspecified: Secondary | ICD-10-CM | POA: Diagnosis not present

## 2016-02-04 DIAGNOSIS — L89153 Pressure ulcer of sacral region, stage 3: Secondary | ICD-10-CM | POA: Diagnosis not present

## 2016-02-04 DIAGNOSIS — L89302 Pressure ulcer of unspecified buttock, stage 2: Secondary | ICD-10-CM | POA: Diagnosis not present

## 2016-02-04 DIAGNOSIS — Z7901 Long term (current) use of anticoagulants: Secondary | ICD-10-CM | POA: Diagnosis not present

## 2016-02-05 NOTE — Telephone Encounter (Signed)
Patient has been scheduled for an office visit for 02/12/16 with Tye Savoy RNP

## 2016-02-07 ENCOUNTER — Encounter (HOSPITAL_COMMUNITY): Payer: Self-pay | Admitting: Emergency Medicine

## 2016-02-07 ENCOUNTER — Emergency Department (HOSPITAL_COMMUNITY)
Admission: EM | Admit: 2016-02-07 | Discharge: 2016-02-08 | Disposition: A | Discharge status: Home / Self Care | Payer: Medicare Other | Attending: Emergency Medicine | Admitting: Emergency Medicine

## 2016-02-07 DIAGNOSIS — Z79899 Other long term (current) drug therapy: Secondary | ICD-10-CM | POA: Insufficient documentation

## 2016-02-07 DIAGNOSIS — E1122 Type 2 diabetes mellitus with diabetic chronic kidney disease: Secondary | ICD-10-CM | POA: Diagnosis not present

## 2016-02-07 DIAGNOSIS — N200 Calculus of kidney: Secondary | ICD-10-CM | POA: Insufficient documentation

## 2016-02-07 DIAGNOSIS — Z955 Presence of coronary angioplasty implant and graft: Secondary | ICD-10-CM | POA: Diagnosis not present

## 2016-02-07 DIAGNOSIS — I130 Hypertensive heart and chronic kidney disease with heart failure and stage 1 through stage 4 chronic kidney disease, or unspecified chronic kidney disease: Secondary | ICD-10-CM | POA: Diagnosis not present

## 2016-02-07 DIAGNOSIS — N184 Chronic kidney disease, stage 4 (severe): Secondary | ICD-10-CM | POA: Diagnosis not present

## 2016-02-07 DIAGNOSIS — I2510 Atherosclerotic heart disease of native coronary artery without angina pectoris: Secondary | ICD-10-CM | POA: Insufficient documentation

## 2016-02-07 DIAGNOSIS — N23 Unspecified renal colic: Secondary | ICD-10-CM | POA: Insufficient documentation

## 2016-02-07 DIAGNOSIS — I959 Hypotension, unspecified: Secondary | ICD-10-CM | POA: Diagnosis not present

## 2016-02-07 DIAGNOSIS — I5032 Chronic diastolic (congestive) heart failure: Secondary | ICD-10-CM | POA: Diagnosis not present

## 2016-02-07 DIAGNOSIS — Z8673 Personal history of transient ischemic attack (TIA), and cerebral infarction without residual deficits: Secondary | ICD-10-CM | POA: Insufficient documentation

## 2016-02-07 DIAGNOSIS — L89302 Pressure ulcer of unspecified buttock, stage 2: Secondary | ICD-10-CM | POA: Diagnosis not present

## 2016-02-07 DIAGNOSIS — Z7902 Long term (current) use of antithrombotics/antiplatelets: Secondary | ICD-10-CM | POA: Diagnosis not present

## 2016-02-07 DIAGNOSIS — R109 Unspecified abdominal pain: Secondary | ICD-10-CM

## 2016-02-07 DIAGNOSIS — R1033 Periumbilical pain: Secondary | ICD-10-CM | POA: Diagnosis present

## 2016-02-07 DIAGNOSIS — Z794 Long term (current) use of insulin: Secondary | ICD-10-CM | POA: Diagnosis not present

## 2016-02-07 DIAGNOSIS — K2970 Gastritis, unspecified, without bleeding: Secondary | ICD-10-CM | POA: Diagnosis not present

## 2016-02-07 DIAGNOSIS — I252 Old myocardial infarction: Secondary | ICD-10-CM | POA: Insufficient documentation

## 2016-02-07 DIAGNOSIS — J449 Chronic obstructive pulmonary disease, unspecified: Secondary | ICD-10-CM | POA: Insufficient documentation

## 2016-02-07 DIAGNOSIS — R1084 Generalized abdominal pain: Secondary | ICD-10-CM | POA: Diagnosis not present

## 2016-02-07 DIAGNOSIS — R10811 Right upper quadrant abdominal tenderness: Secondary | ICD-10-CM | POA: Diagnosis not present

## 2016-02-07 DIAGNOSIS — L89153 Pressure ulcer of sacral region, stage 3: Secondary | ICD-10-CM | POA: Diagnosis not present

## 2016-02-07 DIAGNOSIS — Z7901 Long term (current) use of anticoagulants: Secondary | ICD-10-CM | POA: Diagnosis not present

## 2016-02-07 NOTE — ED Triage Notes (Signed)
Pt comes to ed, via ems, c/o abdominal x 5 days. Nausea present, no vomiting. Can use the bathroom fro 5 days. Pt comes from home [place] Pt hx stroke and diabetes. alert x4 ( right side deficits ) baseline Vs on 119/74, hr90, rr16, sp2 99 room air  cbg 178/

## 2016-02-08 ENCOUNTER — Emergency Department (HOSPITAL_COMMUNITY): Payer: Medicare Other

## 2016-02-08 DIAGNOSIS — N23 Unspecified renal colic: Secondary | ICD-10-CM | POA: Diagnosis not present

## 2016-02-08 DIAGNOSIS — N200 Calculus of kidney: Secondary | ICD-10-CM | POA: Diagnosis not present

## 2016-02-08 LAB — CBC WITH DIFFERENTIAL/PLATELET
Basophils Absolute: 0 10*3/uL (ref 0.0–0.1)
Basophils Relative: 0 %
Eosinophils Absolute: 0.2 10*3/uL (ref 0.0–0.7)
Eosinophils Relative: 3 %
HCT: 27.1 % — ABNORMAL LOW (ref 36.0–46.0)
Hemoglobin: 8.6 g/dL — ABNORMAL LOW (ref 12.0–15.0)
Lymphocytes Relative: 37 %
Lymphs Abs: 2.7 10*3/uL (ref 0.7–4.0)
MCH: 28.3 pg (ref 26.0–34.0)
MCHC: 31.7 g/dL (ref 30.0–36.0)
MCV: 89.1 fL (ref 78.0–100.0)
Monocytes Absolute: 0.6 10*3/uL (ref 0.1–1.0)
Monocytes Relative: 8 %
Neutro Abs: 3.8 10*3/uL (ref 1.7–7.7)
Neutrophils Relative %: 52 %
Platelets: 208 10*3/uL (ref 150–400)
RBC: 3.04 MIL/uL — ABNORMAL LOW (ref 3.87–5.11)
RDW: 16.6 % — ABNORMAL HIGH (ref 11.5–15.5)
WBC: 7.4 10*3/uL (ref 4.0–10.5)

## 2016-02-08 LAB — URINALYSIS, ROUTINE W REFLEX MICROSCOPIC
Bilirubin Urine: NEGATIVE
Glucose, UA: NEGATIVE mg/dL
Ketones, ur: NEGATIVE mg/dL
Nitrite: NEGATIVE
Protein, ur: 30 mg/dL — AB
Specific Gravity, Urine: 1.013 (ref 1.005–1.030)
pH: 5 (ref 5.0–8.0)

## 2016-02-08 LAB — COMPREHENSIVE METABOLIC PANEL
ALT: 11 U/L — ABNORMAL LOW (ref 14–54)
AST: 11 U/L — ABNORMAL LOW (ref 15–41)
Albumin: 3.1 g/dL — ABNORMAL LOW (ref 3.5–5.0)
Alkaline Phosphatase: 56 U/L (ref 38–126)
Anion gap: 7 (ref 5–15)
BUN: 38 mg/dL — ABNORMAL HIGH (ref 6–20)
CO2: 22 mmol/L (ref 22–32)
Calcium: 9.1 mg/dL (ref 8.9–10.3)
Chloride: 110 mmol/L (ref 101–111)
Creatinine, Ser: 2.51 mg/dL — ABNORMAL HIGH (ref 0.44–1.00)
GFR calc Af Amer: 22 mL/min — ABNORMAL LOW (ref >60)
GFR calc non Af Amer: 19 mL/min — ABNORMAL LOW (ref >60)
Glucose, Bld: 114 mg/dL — ABNORMAL HIGH (ref 65–99)
Potassium: 4.5 mmol/L (ref 3.5–5.1)
Sodium: 139 mmol/L (ref 135–145)
Total Bilirubin: 0.4 mg/dL (ref 0.3–1.2)
Total Protein: 6 g/dL — ABNORMAL LOW (ref 6.5–8.1)

## 2016-02-08 LAB — I-STAT CG4 LACTIC ACID, ED: Lactic Acid, Venous: 0.75 mmol/L (ref 0.5–1.9)

## 2016-02-08 LAB — LIPASE, BLOOD: Lipase: 37 U/L (ref 11–51)

## 2016-02-08 MED ORDER — FENTANYL CITRATE (PF) 100 MCG/2ML IJ SOLN
50.0000 ug | INTRAMUSCULAR | Status: DC | PRN
  Administered 2016-02-08: 50 ug via INTRAVENOUS
  Filled 2016-02-08: qty 2

## 2016-02-08 MED ORDER — CEPHALEXIN 500 MG PO CAPS: 500.0000 mg | ORAL_CAPSULE | Freq: Two times a day (BID) | ORAL | Status: DC

## 2016-02-08 MED ORDER — ONDANSETRON HCL 4 MG/2ML IJ SOLN
4.0000 mg | Freq: Once | INTRAMUSCULAR | Status: AC
  Administered 2016-02-08: 4 mg via INTRAVENOUS
  Filled 2016-02-08: qty 2

## 2016-02-08 MED ORDER — SODIUM CHLORIDE 0.9 % IV BOLUS (SEPSIS)
500.0000 mL | Freq: Once | INTRAVENOUS | Status: AC
  Administered 2016-02-08: 500 mL via INTRAVENOUS

## 2016-02-08 MED ORDER — SULFAMETHOXAZOLE-TRIMETHOPRIM 800-160 MG PO TABS
1.0000 | ORAL_TABLET | Freq: Once | ORAL | Status: AC
  Administered 2016-02-08: 1 via ORAL
  Filled 2016-02-08: qty 1

## 2016-02-08 NOTE — ED Notes (Signed)
RN to draw labs. 

## 2016-02-08 NOTE — ED Provider Notes (Signed)
[facility] DEPT Provider Note   CSN: [id number] Arrival date & time: 02/07/16  2321  By signing my name below, I, Gwenlyn Fudge, attest that this documentation has been prepared under the direction and in the presence of Elnora Morrison, MD. Electronically Signed: Gwenlyn Fudge, ED Scribe. 02/08/16. 6:47 AM.   History   Chief Complaint Chief Complaint  Patient presents with  . Abdominal Pain  . Constipation  . Nausea   The history is provided by the patient and a relative. No language interpreter was used.    HPI Comments: Michelle Holmes is a 66 y.o. female with PMHx of CHF, COPD, CAD, DM, HTN, MI, DVT and stroke who presents to the Emergency Department complaining of gradual onset, intermittent, moderate periumbilical, pressure-like abdominal pain for 1 week. Pt has associated vomiting, constipation (for 12 days), nausea, generalized weakness, lightheadedness. Pt takes Coumadin and nausea medication. Per relative, these are the only medications she has been taking. Pt has hx of kidney stones. Pt has had a catheter for 3 months. She denies fever   Past Medical History:  Diagnosis Date  . Anemia   . CHF (congestive heart failure) ([place])   . Chronic back pain   . COPD (chronic obstructive pulmonary disease) ([place])   . Coronary artery disease   . Diabetes mellitus   . DJD (degenerative joint disease)   . Emphysema   . Hypercholesteremia   . Hypertension   . Myocardial infarction   . Obesity   . Obesity hypoventilation syndrome ([place])   . Renal insufficiency   . Scoliosis   . Sleep apnea   . Stroke ([place])   . TIA (transient ischemic attack)     Patient Active Problem List   Diagnosis Date Noted  . Chronic diastolic CHF (congestive heart failure) ([place]) 01/28/2016  . Hypotension 01/28/2016  . Decubitus ulcer of sacral region, stage 2 01/28/2016  . Dehydration 01/28/2016  . Protein-calorie malnutrition, severe 01/28/2016  . Emesis   . Goals of care, counseling/discussion   .  Palliative care by specialist   . AKI (acute kidney injury) ([place]) 12/11/2015  . Anemia in other chronic diseases classified elsewhere   . Acute urinary retention   . Acute kidney injury superimposed on chronic kidney disease ([place])   . Pressure injury of skin 11/24/2015  . Acute metabolic encephalopathy   . Chronic kidney disease (CKD), stage IV (severe) ([place]) 07/06/2015  . Acute CVA (cerebrovascular accident) ([place])   . Acute encephalopathy 07/04/2015  . Sinus bradycardia 07/04/2015  . Urinary tract infection without hematuria 07/04/2015  . Parietal lobe infarction ([place]) 07/04/2015  . Spastic hemiplegia affecting nondominant side ([place]) 07/01/2015  . Obesity, morbid ([place]) 07/01/2015  . Thrombocytopenia ([place])   . Acute ischemic VBA thalamic stroke ([place]) 01/18/2015  . Acute kidney injury superimposed on CKD ([place])   . Dysarthria   . Lethargy   . Labile blood pressure   . Hyperlipidemia   . History of CVA with residual deficit   . Chronic obstructive pulmonary disease ([place])   . Hemiparesis, aphasia, and dysphagia as late effect of cerebrovascular accident (CVA) ([place])   . Acute ischemic stroke ([place])   . Right hemiplegia ([place])   . Right sided weakness 01/13/2015  . Abdominal pain 06/20/2014  . Altered mental status 02/08/2012  . OSA on CPAP 12/08/2010  . Morbid obesity ([place]) 12/08/2010  . CAD (coronary artery disease) 12/08/2010  . Cellulitis of pubic region 12/01/2010  . Uncontrolled type 2 DM with hyperosmolar nonketotic  hyperglycemia ([place]) 12/01/2010    Past Surgical History:  Procedure Laterality Date  . COLON SURGERY    . CORONARY ANGIOPLASTY WITH STENT PLACEMENT      OB History    No data available       Home Medications    Prior to Admission medications   Medication Sig Start Date End Date Taking? Authorizing Provider  LYRICA 50 MG capsule Take 1 capsule (50 mg total) by mouth daily. Patient taking differently: Take 50 mg by mouth 2 (two) times daily.  07/14/15   Yes Silver Huguenin Elgergawy, MD  ondansetron (ZOFRAN) 4 MG tablet Take 1 tablet by mouth every 8 (eight) hours as needed. 01/17/16  Yes Historical Provider, MD  traMADol (ULTRAM) 50 MG tablet take 1 TABLET BY MOUTH EVERY 12 hours 12/30/15  Yes Charlett Blake, MD  warfarin (COUMADIN) 5 MG tablet Take 1 tablet (5 mg total) by mouth daily. 02/01/16  Yes Charlynne Cousins, MD  amLODipine (NORVASC) 5 MG tablet Take 5 mg by mouth at bedtime.    Historical Provider, MD  atorvastatin (LIPITOR) 80 MG tablet Take 1 tablet (80 mg total) by mouth daily at 6 PM. Patient taking differently: Take 40 mg by mouth daily at 6 PM.  07/07/15   Mauricio Gerome Apley, MD  clopidogrel (PLAVIX) 75 MG tablet Take 1 tablet (75 mg total) by mouth daily. 02/07/15   Lavon Paganini Angiulli, PA-C  cyclobenzaprine (FLEXERIL) 10 MG tablet Take 1 tablet by mouth 2 (two) times daily as needed for spasms. 01/19/16   Historical Provider, MD  enoxaparin (LOVENOX) 80 MG/0.8ML injection Inject 0.8 mLs (80 mg total) into the skin daily. Patient not taking: Reported on 02/08/2016 02/01/16   Charlynne Cousins, MD  furosemide (LASIX) 20 MG tablet Take 1 tablet (20 mg total) by mouth every Monday, Wednesday, and Friday. Please start first dose on Wednesday 11/22 12/18/15   Albertine Patricia, MD  HYDROcodone-acetaminophen (NORCO/VICODIN) 5-325 MG tablet Take 1 tablet by mouth every 4 (four) hours as needed for severe pain. 09/26/15   [name], MD  Insulin Glargine (LANTUS SOLOSTAR) 100 UNIT/ML Solostar Pen Inject 10 Units into the skin at bedtime. 12/14/15   Silver Huguenin Elgergawy, MD  lisinopril (PRINIVIL,ZESTRIL) 10 MG tablet Take 10 mg by mouth every morning. 01/18/16   Historical Provider, MD  metoprolol succinate (TOPROL-XL) 50 MG 24 hr tablet Take 50 mg by mouth daily. Take with or immediately following a meal.    Historical Provider, MD  polyethylene glycol powder (MIRALAX) powder Take 17 g by mouth daily. 01/15/16   Jola Schmidt, MD    senna-docusate (SENOKOT-S) 8.6-50 MG tablet Take 1 tablet by mouth at bedtime. Do not take if diarrhea 11/29/15   Florencia Reasons, MD  sodium bicarbonate 650 MG tablet Take 1 tablet (650 mg total) by mouth daily. 11/29/15   Florencia Reasons, MD    Family History Family History  Problem Relation Age of Onset  . Diabetes Mother   . Stroke Maternal Aunt     Social History Social History  Substance Use Topics  . Smoking status: Never Smoker  . Smokeless tobacco: Never Used  . Alcohol use No     Allergies   Penicillins  Review of Systems Review of Systems  Constitutional: Negative for fever.  Gastrointestinal: Positive for abdominal pain, constipation, nausea and vomiting.  Neurological: Positive for weakness and light-headedness.  All other systems reviewed and are negative.  Physical Exam Updated Vital Signs BP 119/68  Pulse 84   Temp 97.5 F (36.4 C) (Oral)   Resp 17   SpO2 (!) 88%   Physical Exam  Constitutional: She is oriented to person, place, and time. She appears well-developed and well-nourished. She is active. No distress.  Catheter in place  HENT:  Head: Normocephalic and atraumatic.  Lips appear dry  Eyes: Conjunctivae are normal.  Cardiovascular: Normal rate, regular rhythm and intact distal pulses.   Pulmonary/Chest: Effort normal. No respiratory distress.  Abdominal: Bowel sounds are normal. She exhibits no distension. There is no guarding.  Mild discomfort generally  Musculoskeletal: Normal range of motion. She exhibits tenderness (mild flank).  No significant swelling in legs  Neurological: She is alert and oriented to person, place, and time.  Skin: Skin is warm and dry.  Psychiatric: She has a normal mood and affect. Her behavior is normal.  Nursing note and vitals reviewed.  ED Treatments / Results  DIAGNOSTIC STUDIES: Oxygen Saturation is 98% on RA, normal by my interpretation.    COORDINATION OF CARE: 12:38 AM Discussed treatment plan with pt at bedside  which includes CT Renal study, CBC with differential/platelet, Lipase, and CMP and pt agreed to plan.  Labs (all labs ordered are listed, but only abnormal results are displayed) Labs Reviewed  CBC WITH DIFFERENTIAL/PLATELET - Abnormal; Notable for the following:       Result Value   RBC 3.04 (*)    Hemoglobin 8.6 (*)    HCT 27.1 (*)    RDW 16.6 (*)    All other components within normal limits  COMPREHENSIVE METABOLIC PANEL - Abnormal; Notable for the following:    Glucose, Bld 114 (*)    BUN 38 (*)    Creatinine, Ser 2.51 (*)    Total Protein 6.0 (*)    Albumin 3.1 (*)    AST 11 (*)    ALT 11 (*)    GFR calc non Af Amer 19 (*)    GFR calc Af Amer 22 (*)    All other components within normal limits  LIPASE, BLOOD  URINALYSIS, ROUTINE W REFLEX MICROSCOPIC  I-STAT CG4 LACTIC ACID, ED    EKG  EKG Interpretation None       Radiology Ct Renal Stone Study  Result Date: 02/08/2016 CLINICAL DATA:  Abdominal pain for 5 days with nausea. EXAM: CT ABDOMEN AND PELVIS WITHOUT CONTRAST TECHNIQUE: Multidetector CT imaging of the abdomen and pelvis was performed following the standard protocol without IV contrast. COMPARISON:  CT abdomen pelvis 01/15/2016 FINDINGS: Lower chest: No pulmonary nodules or pleural effusion. No visible pericardial effusion. There are coronary artery calcifications. Hepatobiliary: Normal noncontrast appearance of the liver. No visible biliary dilatation. Status post cholecystectomy. Pancreas: Normal noncontrast appearance of the pancreas. No peripancreatic fluid collection. Spleen: Normal. Adrenal glands: Normal. Urinary Tract: --Right kidney: There are multiple right renal calculi measuring up to 4 mm. There is a 2 mm stone at the right renal pelvis. No associated hydronephrosis or perinephric stranding. No obstruction of the right ureter. --Left kidney: There are multiple left renal calculi measuring up to 7 mm. No hydronephrosis. The distal left ureter is poorly  visualized. --Urinary bladder: The urinary bladder is decompressed by Foley catheter. Stomach/Bowel: No dilated loops of bowel. No evidence of colonic or enteric inflammation. No fluid collection within the abdomen. Appendix not visualized. Vascular/Lymphatic: There is atherosclerotic calcification of the non aneurysmal abdominal aorta. There is no abdominal or pelvic lymphadenopathy. Reproductive: Status post hysterectomy.  No adnexal mass. Musculoskeletal. There  is severe multilevel lumbar osteophytosis and facet arthrosis with marked dextroscoliosis. There is spinal canal stenosis at the L3 level. IMPRESSION: 1. Bilateral nephrolithiasis, including a 2 mm stone at the right renal pelvis, which may be a source of renal colic. No associated hydronephrosis. 2. Severe lumbar degeneration with suspected moderate spinal canal stenosis at the L3 level. 3. Aortic and coronary artery atherosclerosis. Electronically Signed   By: Ulyses Jarred M.D.   On: 02/08/2016 02:09    Procedures Procedures (including critical care time)  Medications Ordered in ED Medications  fentaNYL (SUBLIMAZE) injection 50 mcg (50 mcg Intravenous Given 02/08/16 0236)  sodium chloride 0.9 % bolus 500 mL (500 mLs Intravenous New Bag/Given 02/08/16 0233)  ondansetron (ZOFRAN) injection 4 mg (4 mg Intravenous Given 02/08/16 0234)     Initial Impression / Assessment and Plan / ED Course  I have reviewed the triage vital signs and the nursing notes.  Pertinent labs & imaging results that were available during my care of the patient were reviewed by me and considered in my medical decision making (see chart for details).  Clinical Course    Patient presents with intermittent abdominal discomfort. Patient overall well-appearing the ER. Patient has chronic Foley. Patient denies any fevers. With poor kidney function CT scan without contrast. It show kidney stone which possible cause of intermittent pain. Urinalysis delay, discussed with  nursing. Plan to exchange Foley as she is due to have it exchanged on Monday. Likely plan for Rocephin, Keflex with outpatient follow-up with urology on Tuesday. Pain control. Patient had lower blood pressure reading which responded to recheck IV and oral fluids. Lactate obtained normal.  Results and differential diagnosis were discussed with the patient/parent/guardian. Xrays were independently reviewed by myself.  Close follow up outpatient was discussed, comfortable with the plan.   Medications  fentaNYL (SUBLIMAZE) injection 50 mcg (50 mcg Intravenous Given 02/08/16 0236)  sodium chloride 0.9 % bolus 500 mL (500 mLs Intravenous New Bag/Given 02/08/16 0233)  ondansetron (ZOFRAN) injection 4 mg (4 mg Intravenous Given 02/08/16 0234)   BP improved, pain controlled.  Close outpt fup. Final diagnoses:  Intermittent abdominal pain  Renal colic     Final Clinical Impressions(s) / ED Diagnoses   Final diagnoses:  Intermittent abdominal pain  Renal colic    New Prescriptions New Prescriptions   No medications on file     Elnora Morrison, MD 02/14/16 1525

## 2016-02-08 NOTE — Discharge Instructions (Signed)
If you were given medicines take as directed.  If you are on coumadin or contraceptives realize their levels and effectiveness is altered by many different medicines.  If you have any reaction (rash, tongues swelling, other) to the medicines stop taking and see a physician.    If your blood pressure was elevated in the ER make sure you follow up for management with a primary doctor or return for chest pain, shortness of breath or stroke symptoms.  Please follow up as directed and return to the ER or see a physician for new or worsening symptoms.  Thank you. Vitals:   02/08/16 0100 02/08/16 0400 02/08/16 0430 02/08/16 0500  BP: (!) 87/61 113/64 100/65 119/68  Pulse: 84 77 79 84  Resp: 18 14 14 17   Temp:      TempSrc:      SpO2: 98% 98% 100% (!) 88%

## 2016-02-08 NOTE — ED Notes (Signed)
Spoke with [facility] ambulance takes pt home.

## 2016-02-08 NOTE — ED Notes (Signed)
Attempt to call daughter [name] with no response at time of discharge per pt request. Will call at later time.

## 2016-02-08 NOTE — ED Notes (Signed)
[facility] called and en route to transport pt home.

## 2016-02-08 NOTE — ED Notes (Signed)
Bed: WA24 Expected date:  Expected time:  Means of arrival:  Comments: 

## 2016-02-08 NOTE — ED Notes (Signed)
Pt now c/o right ankle pain.  Pt stated "it's throbbing."  Pt rated pain 10/10.

## 2016-02-08 NOTE — ED Notes (Signed)
Bed: [facility] Expected date:  Expected time:  Means of arrival:  Comments: 

## 2016-02-10 DIAGNOSIS — I959 Hypotension, unspecified: Secondary | ICD-10-CM | POA: Diagnosis not present

## 2016-02-10 DIAGNOSIS — L89153 Pressure ulcer of sacral region, stage 3: Secondary | ICD-10-CM | POA: Diagnosis not present

## 2016-02-10 DIAGNOSIS — Z7901 Long term (current) use of anticoagulants: Secondary | ICD-10-CM | POA: Diagnosis not present

## 2016-02-10 DIAGNOSIS — L89302 Pressure ulcer of unspecified buttock, stage 2: Secondary | ICD-10-CM | POA: Diagnosis not present

## 2016-02-12 ENCOUNTER — Ambulatory Visit: Payer: Self-pay | Admitting: Nurse Practitioner

## 2016-02-14 ENCOUNTER — Emergency Department (HOSPITAL_COMMUNITY)
Admission: EM | Admit: 2016-02-14 | Discharge: 2016-02-15 | Disposition: A | Discharge status: Home / Self Care | Payer: Medicare Other | Attending: Emergency Medicine | Admitting: Emergency Medicine

## 2016-02-14 ENCOUNTER — Emergency Department (HOSPITAL_COMMUNITY): Payer: Medicare Other

## 2016-02-14 ENCOUNTER — Encounter (HOSPITAL_COMMUNITY): Payer: Self-pay | Admitting: Emergency Medicine

## 2016-02-14 DIAGNOSIS — R1031 Right lower quadrant pain: Secondary | ICD-10-CM | POA: Diagnosis not present

## 2016-02-14 DIAGNOSIS — M150 Primary generalized (osteo)arthritis: Secondary | ICD-10-CM | POA: Diagnosis not present

## 2016-02-14 DIAGNOSIS — Z955 Presence of coronary angioplasty implant and graft: Secondary | ICD-10-CM | POA: Diagnosis not present

## 2016-02-14 DIAGNOSIS — N200 Calculus of kidney: Secondary | ICD-10-CM | POA: Diagnosis not present

## 2016-02-14 DIAGNOSIS — J449 Chronic obstructive pulmonary disease, unspecified: Secondary | ICD-10-CM | POA: Insufficient documentation

## 2016-02-14 DIAGNOSIS — I130 Hypertensive heart and chronic kidney disease with heart failure and stage 1 through stage 4 chronic kidney disease, or unspecified chronic kidney disease: Secondary | ICD-10-CM | POA: Insufficient documentation

## 2016-02-14 DIAGNOSIS — N184 Chronic kidney disease, stage 4 (severe): Secondary | ICD-10-CM | POA: Diagnosis not present

## 2016-02-14 DIAGNOSIS — I5032 Chronic diastolic (congestive) heart failure: Secondary | ICD-10-CM | POA: Diagnosis not present

## 2016-02-14 DIAGNOSIS — Z79899 Other long term (current) drug therapy: Secondary | ICD-10-CM | POA: Diagnosis not present

## 2016-02-14 DIAGNOSIS — Z7901 Long term (current) use of anticoagulants: Secondary | ICD-10-CM | POA: Insufficient documentation

## 2016-02-14 DIAGNOSIS — K5900 Constipation, unspecified: Secondary | ICD-10-CM | POA: Diagnosis not present

## 2016-02-14 DIAGNOSIS — I2510 Atherosclerotic heart disease of native coronary artery without angina pectoris: Secondary | ICD-10-CM | POA: Insufficient documentation

## 2016-02-14 DIAGNOSIS — Z8673 Personal history of transient ischemic attack (TIA), and cerebral infarction without residual deficits: Secondary | ICD-10-CM | POA: Diagnosis not present

## 2016-02-14 DIAGNOSIS — I509 Heart failure, unspecified: Secondary | ICD-10-CM | POA: Diagnosis not present

## 2016-02-14 DIAGNOSIS — Z794 Long term (current) use of insulin: Secondary | ICD-10-CM | POA: Insufficient documentation

## 2016-02-14 DIAGNOSIS — I252 Old myocardial infarction: Secondary | ICD-10-CM | POA: Diagnosis not present

## 2016-02-14 DIAGNOSIS — R10813 Right lower quadrant abdominal tenderness: Secondary | ICD-10-CM | POA: Diagnosis not present

## 2016-02-14 DIAGNOSIS — R100 Acute abdomen: Secondary | ICD-10-CM | POA: Diagnosis not present

## 2016-02-14 DIAGNOSIS — R1030 Lower abdominal pain, unspecified: Secondary | ICD-10-CM | POA: Diagnosis not present

## 2016-02-14 DIAGNOSIS — K2970 Gastritis, unspecified, without bleeding: Secondary | ICD-10-CM | POA: Diagnosis not present

## 2016-02-14 DIAGNOSIS — E1122 Type 2 diabetes mellitus with diabetic chronic kidney disease: Secondary | ICD-10-CM | POA: Insufficient documentation

## 2016-02-14 LAB — COMPREHENSIVE METABOLIC PANEL
ALT: 13 U/L — ABNORMAL LOW (ref 14–54)
AST: 13 U/L — ABNORMAL LOW (ref 15–41)
Albumin: 3 g/dL — ABNORMAL LOW (ref 3.5–5.0)
Alkaline Phosphatase: 62 U/L (ref 38–126)
Anion gap: 7 (ref 5–15)
BUN: 32 mg/dL — ABNORMAL HIGH (ref 6–20)
CO2: 19 mmol/L — ABNORMAL LOW (ref 22–32)
Calcium: 9.3 mg/dL (ref 8.9–10.3)
Chloride: 114 mmol/L — ABNORMAL HIGH (ref 101–111)
Creatinine, Ser: 2.59 mg/dL — ABNORMAL HIGH (ref 0.44–1.00)
GFR calc Af Amer: 21 mL/min — ABNORMAL LOW (ref >60)
GFR calc non Af Amer: 18 mL/min — ABNORMAL LOW (ref >60)
Glucose, Bld: 123 mg/dL — ABNORMAL HIGH (ref 65–99)
Potassium: 5 mmol/L (ref 3.5–5.1)
Sodium: 140 mmol/L (ref 135–145)
Total Bilirubin: 0.5 mg/dL (ref 0.3–1.2)
Total Protein: 6.1 g/dL — ABNORMAL LOW (ref 6.5–8.1)

## 2016-02-14 LAB — CBC WITH DIFFERENTIAL/PLATELET
Basophils Absolute: 0 10*3/uL (ref 0.0–0.1)
Basophils Relative: 0 %
Eosinophils Absolute: 0.1 10*3/uL (ref 0.0–0.7)
Eosinophils Relative: 1 %
HCT: 28 % — ABNORMAL LOW (ref 36.0–46.0)
Hemoglobin: 9 g/dL — ABNORMAL LOW (ref 12.0–15.0)
Lymphocytes Relative: 25 %
Lymphs Abs: 2 10*3/uL (ref 0.7–4.0)
MCH: 28.8 pg (ref 26.0–34.0)
MCHC: 32.1 g/dL (ref 30.0–36.0)
MCV: 89.5 fL (ref 78.0–100.0)
Monocytes Absolute: 0.6 10*3/uL (ref 0.1–1.0)
Monocytes Relative: 7 %
Neutro Abs: 5.3 10*3/uL (ref 1.7–7.7)
Neutrophils Relative %: 67 %
Platelets: 252 10*3/uL (ref 150–400)
RBC: 3.13 MIL/uL — ABNORMAL LOW (ref 3.87–5.11)
RDW: 16.6 % — ABNORMAL HIGH (ref 11.5–15.5)
WBC: 8.1 10*3/uL (ref 4.0–10.5)

## 2016-02-14 LAB — URINALYSIS, ROUTINE W REFLEX MICROSCOPIC
Bilirubin Urine: NEGATIVE
Glucose, UA: NEGATIVE mg/dL
Ketones, ur: NEGATIVE mg/dL
Nitrite: NEGATIVE
Protein, ur: 30 mg/dL — AB
Specific Gravity, Urine: 1.014 (ref 1.005–1.030)
pH: 5 (ref 5.0–8.0)

## 2016-02-14 LAB — LIPASE, BLOOD: Lipase: 18 U/L (ref 11–51)

## 2016-02-14 LAB — I-STAT CG4 LACTIC ACID, ED: Lactic Acid, Venous: 0.69 mmol/L (ref 0.5–1.9)

## 2016-02-14 MED ORDER — HYDROMORPHONE HCL 2 MG/ML IJ SOLN
1.0000 mg | Freq: Once | INTRAMUSCULAR | Status: AC
  Administered 2016-02-14: 1 mg via INTRAVENOUS
  Filled 2016-02-14: qty 1

## 2016-02-14 MED ORDER — ONDANSETRON HCL 4 MG/2ML IJ SOLN
4.0000 mg | Freq: Once | INTRAMUSCULAR | Status: AC
  Administered 2016-02-14: 4 mg via INTRAVENOUS
  Filled 2016-02-14: qty 2

## 2016-02-14 MED ORDER — SODIUM CHLORIDE 0.9 % IV BOLUS (SEPSIS)
500.0000 mL | Freq: Once | INTRAVENOUS | Status: AC
  Administered 2016-02-14: 500 mL via INTRAVENOUS

## 2016-02-14 NOTE — Consult Note (Signed)
Reason for Consult: abdominal pain Referring Physician: Gerald Stabs Tegeler  Michelle Holmes is an 66 y.o. female.  HPI: 66 yo female with multiple medical problems including chronic constipation. She presents with 2 days of lower abdominal pain. Yesterday she took a laxative and had a large bowel movement. Today she notes continued abdominal pain as well as nausea and vomiting. No fevers and chills.  Past Medical History:  Diagnosis Date  . Anemia   . CHF (congestive heart failure) ([place])   . Chronic back pain   . COPD (chronic obstructive pulmonary disease) ([place])   . Coronary artery disease   . Diabetes mellitus   . DJD (degenerative joint disease)   . Emphysema   . Hypercholesteremia   . Hypertension   . Myocardial infarction   . Obesity   . Obesity hypoventilation syndrome ([place])   . Renal insufficiency   . Scoliosis   . Sleep apnea   . Stroke ([place])   . TIA (transient ischemic attack)     Past Surgical History:  Procedure Laterality Date  . COLON SURGERY    . CORONARY ANGIOPLASTY WITH STENT PLACEMENT      Family History  Problem Relation Age of Onset  . Diabetes Mother   . Stroke Maternal Aunt     Social History:  reports that she has never smoked. She has never used smokeless tobacco. She reports that she does not drink alcohol or use drugs.  Allergies:  Allergies  Allergen Reactions  . Penicillins Hives and Swelling    Has patient had a PCN reaction causing immediate rash, facial/tongue/throat swelling, SOB or lightheadedness with hypotension: yes Has patient had a PCN reaction causing severe rash involving mucus membranes or skin necrosis: no Has patient had a PCN reaction that required hospitalization no Has patient had a PCN reaction occurring within the last 10 years: no If all of the above answers are "NO", then may proceed with Cephalosporin use.     Medications: I have reviewed the patient's current medications.  Results for orders placed or performed during  the hospital encounter of 02/14/16 (from the past 48 hour(s))  Urinalysis, Routine w reflex microscopic     Status: Abnormal   Collection Time: 02/14/16 10:20 AM  Result Value Ref Range   Color, Urine AMBER (A) YELLOW    Comment: BIOCHEMICALS MAY BE AFFECTED BY COLOR   APPearance TURBID (A) CLEAR   Specific Gravity, Urine 1.014 1.005 - 1.030   pH 5.0 5.0 - 8.0   Glucose, UA NEGATIVE NEGATIVE mg/dL   Hgb urine dipstick MODERATE (A) NEGATIVE   Bilirubin Urine NEGATIVE NEGATIVE   Ketones, ur NEGATIVE NEGATIVE mg/dL   Protein, ur 30 (A) NEGATIVE mg/dL   Nitrite NEGATIVE NEGATIVE   Leukocytes, UA MODERATE (A) NEGATIVE   RBC / HPF TOO NUMEROUS TO COUNT 0 - 5 RBC/hpf   WBC, UA TOO NUMEROUS TO COUNT 0 - 5 WBC/hpf   Bacteria, UA FEW (A) NONE SEEN   Squamous Epithelial / LPF TOO NUMEROUS TO COUNT (A) NONE SEEN   WBC Clumps PRESENT    Mucous PRESENT    Budding Yeast PRESENT    Non Squamous Epithelial 0-5 (A) NONE SEEN  CBC with Differential     Status: Abnormal   Collection Time: 02/14/16 12:08 PM  Result Value Ref Range   WBC 8.1 4.0 - 10.5 K/uL   RBC 3.13 (L) 3.87 - 5.11 MIL/uL   Hemoglobin 9.0 (L) 12.0 - 15.0 g/dL   HCT 28.0 (  L) 36.0 - 46.0 %   MCV 89.5 78.0 - 100.0 fL   MCH 28.8 26.0 - 34.0 pg   MCHC 32.1 30.0 - 36.0 g/dL   RDW 16.6 (H) 11.5 - 15.5 %   Platelets 252 150 - 400 K/uL   Neutrophils Relative % 67 %   Neutro Abs 5.3 1.7 - 7.7 K/uL   Lymphocytes Relative 25 %   Lymphs Abs 2.0 0.7 - 4.0 K/uL   Monocytes Relative 7 %   Monocytes Absolute 0.6 0.1 - 1.0 K/uL   Eosinophils Relative 1 %   Eosinophils Absolute 0.1 0.0 - 0.7 K/uL   Basophils Relative 0 %   Basophils Absolute 0.0 0.0 - 0.1 K/uL  Comprehensive metabolic panel     Status: Abnormal   Collection Time: 02/14/16 12:08 PM  Result Value Ref Range   Sodium 140 135 - 145 mmol/L   Potassium 5.0 3.5 - 5.1 mmol/L   Chloride 114 (H) 101 - 111 mmol/L   CO2 19 (L) 22 - 32 mmol/L   Glucose, Bld 123 (H) 65 - 99 mg/dL    BUN 32 (H) 6 - 20 mg/dL   Creatinine, Ser 2.59 (H) 0.44 - 1.00 mg/dL   Calcium 9.3 8.9 - 10.3 mg/dL   Total Protein 6.1 (L) 6.5 - 8.1 g/dL   Albumin 3.0 (L) 3.5 - 5.0 g/dL   AST 13 (L) 15 - 41 U/L   ALT 13 (L) 14 - 54 U/L   Alkaline Phosphatase 62 38 - 126 U/L   Total Bilirubin 0.5 0.3 - 1.2 mg/dL   GFR calc non Af Amer 18 (L) >60 mL/min   GFR calc Af Amer 21 (L) >60 mL/min    Comment: (NOTE) The eGFR has been calculated using the CKD EPI equation. This calculation has not been validated in all clinical situations. eGFR's persistently <60 mL/min signify possible Chronic Kidney Disease.    Anion gap 7 5 - 15  Lipase, blood     Status: None   Collection Time: 02/14/16 12:08 PM  Result Value Ref Range   Lipase 18 11 - 51 U/L  I-Stat CG4 Lactic Acid, ED     Status: None   Collection Time: 02/14/16 12:16 PM  Result Value Ref Range   Lactic Acid, Venous 0.69 0.5 - 1.9 mmol/L    Ct Abdomen Pelvis Wo Contrast  Result Date: 02/14/2016 CLINICAL DATA:  Diffuse abdominal pain.  Nephrolithiasis. EXAM: CT ABDOMEN AND PELVIS WITHOUT CONTRAST TECHNIQUE: Multidetector CT imaging of the abdomen and pelvis was performed following the standard protocol without IV contrast. COMPARISON:  CT of the abdomen and pelvis 02/08/2016 FINDINGS: Lower chest: Bilateral lower lobe airspace disease likely reflects atelectasis. There is no significant consolidation. Heart is enlarged. There are no effusions. Extensive coronary artery calcifications are present. Hepatobiliary: No focal hepatic lesions are present. The common bile duct is within normal limits following cholecystectomy. Pancreas: Unremarkable. No pancreatic ductal dilatation or surrounding inflammatory changes. Spleen: Normal in size without focal abnormality. Adrenals/Urinary Tract: A right adrenal adenoma is stable. The left adrenal gland is normal. Bilateral nephrolithiasis is unchanged. No focal renal mass is present otherwise. The ureters are within  normal limits. A Foley catheter decompresses the urinary bladder. Stomach/Bowel: A small hiatal hernia is present. Stomach and duodenum are otherwise within normal limits. The small bowel is unremarkable. Partial right hemicolectomy is noted. The colon is otherwise unremarkable. There is marked distention at the level of the rectum with a transverse diameter of 8  mm. There may be an intussusception of the distal sigmoid colon. No discrete mass lesion is seen. Vascular/Lymphatic: Atherosclerotic calcifications are present throughout the abdominal aorta and branch vessels without aneurysm. Reproductive: Hysterectomy is noted. Adnexae are within normal limits for age. Other: No significant free fluid or free air is present. Musculoskeletal: Severe scoliosis and multilevel degenerative changes are stable. IMPRESSION: 1. Interval marked distention of the distal sigmoid colon and rectum with possible intussusception. Although no mass lesion is identified, intussusception can occur in the setting of a mass or polyp. Recommend follow-up with gastroenterology and correlation with most recent colonoscopy. 2. Stable bilateral nephrolithiasis without evidence for ureteral stone or obstruction. 3. Stable right adrenal adenoma. 4. Bilateral airspace disease likely reflects atelectasis. 5. Severe scoliosis and multilevel degenerative changes in the thoracolumbar spine. Electronically Signed   By: San Morelle M.D.   On: 02/14/2016 11:51    Review of Systems  Constitutional: Negative for chills and fever.  HENT: Negative for hearing loss.   Eyes: Negative for blurred vision and double vision.  Respiratory: Negative for cough and hemoptysis.   Cardiovascular: Negative for chest pain and palpitations.  Gastrointestinal: Positive for abdominal pain, nausea and vomiting.  Genitourinary: Negative for dysuria and urgency.  Musculoskeletal: Negative for myalgias and neck pain.  Skin: Negative for itching and rash.   Neurological: Positive for focal weakness. Negative for dizziness, tingling and headaches.  Endo/Heme/Allergies: Does not bruise/bleed easily.  Psychiatric/Behavioral: Negative for depression and suicidal ideas.   Blood pressure 133/99, pulse 85, temperature 98.8 F (37.1 C), temperature source Oral, resp. rate 19, SpO2 100 %. Physical Exam  Nursing note and vitals reviewed. Constitutional: She is oriented to person, place, and time. She appears well-developed and well-nourished.  HENT:  Head: Normocephalic and atraumatic.  Eyes: Conjunctivae and EOM are normal. No scleral icterus.  Neck: Normal range of motion. Neck supple.  Cardiovascular: Normal rate and regular rhythm.   Respiratory: Effort normal and breath sounds normal. She has no wheezes. She has no rales. She exhibits no tenderness.  GI: Soft. She exhibits no distension. There is tenderness in the suprapubic area. There is no rebound.  Musculoskeletal: Normal range of motion. She exhibits no edema.  Neurological: She is alert and oriented to person, place, and time.  Skin: Skin is warm and dry.  Psychiatric: She has a normal mood and affect. Her behavior is normal.    Assessment/Plan: 66 yo female with abdominal pain, CT cannot rule out intussusception, however on further evaluation sigmoid colon appears to be full of dense stool ball rather than intussuscepted. -enema and reevaluation  Arta Bruce Miriah Maruyama 02/14/2016, 3:39 PM   Enema with liquid and hand full of stool, abdominal pain has resolved, no lab abnormalities -recommend discharge home, daily miralax for next month, follow up with gastroenterology

## 2016-02-14 NOTE — ED Notes (Addendum)
Enema done ,  Only return was brown liquid stool

## 2016-02-14 NOTE — ED Provider Notes (Signed)
[facility] DEPT Provider Note   CSN: [id number] Arrival date & time: 02/14/16  0901     History   Chief Complaint Chief Complaint  Patient presents with  . Abdominal Pain    HPI Michelle Holmes is a 66 y.o. female.  The history is provided by the patient and medical records. No language interpreter was used.  Abdominal Pain   This is a recurrent problem. The current episode started yesterday. The problem occurs constantly. The problem has been gradually worsening. The pain is located in the RLQ. The quality of the pain is aching, cramping and sharp. The pain is at a severity of 10/10. The pain is severe. Associated symptoms include nausea, vomiting and constipation. Pertinent negatives include fever, diarrhea, dysuria, frequency, hematuria and headaches. The symptoms are aggravated by palpation. Nothing relieves the symptoms.    Past Medical History:  Diagnosis Date  . Anemia   . CHF (congestive heart failure) ([place])   . Chronic back pain   . COPD (chronic obstructive pulmonary disease) ([place])   . Coronary artery disease   . Diabetes mellitus   . DJD (degenerative joint disease)   . Emphysema   . Hypercholesteremia   . Hypertension   . Myocardial infarction   . Obesity   . Obesity hypoventilation syndrome ([place])   . Renal insufficiency   . Scoliosis   . Sleep apnea   . Stroke ([place])   . TIA (transient ischemic attack)     Patient Active Problem List   Diagnosis Date Noted  . Chronic diastolic CHF (congestive heart failure) ([place]) 01/28/2016  . Hypotension 01/28/2016  . Decubitus ulcer of sacral region, stage 2 01/28/2016  . Dehydration 01/28/2016  . Protein-calorie malnutrition, severe 01/28/2016  . Emesis   . Goals of care, counseling/discussion   . Palliative care by specialist   . AKI (acute kidney injury) ([place]) 12/11/2015  . Anemia in other chronic diseases classified elsewhere   . Acute urinary retention   . Acute kidney injury superimposed on chronic kidney  disease ([place])   . Pressure injury of skin 11/24/2015  . Acute metabolic encephalopathy   . Chronic kidney disease (CKD), stage IV (severe) ([place]) 07/06/2015  . Acute CVA (cerebrovascular accident) ([place])   . Acute encephalopathy 07/04/2015  . Sinus bradycardia 07/04/2015  . Urinary tract infection without hematuria 07/04/2015  . Parietal lobe infarction ([place]) 07/04/2015  . Spastic hemiplegia affecting nondominant side ([place]) 07/01/2015  . Obesity, morbid ([place]) 07/01/2015  . Thrombocytopenia ([place])   . Acute ischemic VBA thalamic stroke ([place]) 01/18/2015  . Acute kidney injury superimposed on CKD ([place])   . Dysarthria   . Lethargy   . Labile blood pressure   . Hyperlipidemia   . History of CVA with residual deficit   . Chronic obstructive pulmonary disease ([place])   . Hemiparesis, aphasia, and dysphagia as late effect of cerebrovascular accident (CVA) ([place])   . Acute ischemic stroke ([place])   . Right hemiplegia ([place])   . Right sided weakness 01/13/2015  . Abdominal pain 06/20/2014  . Altered mental status 02/08/2012  . OSA on CPAP 12/08/2010  . Morbid obesity ([place]) 12/08/2010  . CAD (coronary artery disease) 12/08/2010  . Cellulitis of pubic region 12/01/2010  . Uncontrolled type 2 DM with hyperosmolar nonketotic hyperglycemia ([place]) 12/01/2010    Past Surgical History:  Procedure Laterality Date  . COLON SURGERY    . CORONARY ANGIOPLASTY WITH STENT PLACEMENT      OB History    No  data available       Home Medications    Prior to Admission medications   Medication Sig Start Date End Date Taking? Authorizing Provider  amLODipine (NORVASC) 5 MG tablet Take 5 mg by mouth at bedtime.    Historical Provider, MD  atorvastatin (LIPITOR) 80 MG tablet Take 1 tablet (80 mg total) by mouth daily at 6 PM. Patient taking differently: Take 40 mg by mouth daily at 6 PM.  07/07/15   Mauricio Gerome Apley, MD  cephALEXin (KEFLEX) 500 MG capsule Take 1 capsule (500 mg total) by mouth 2 (two)  times daily. 02/08/16   Elnora Morrison, MD  clopidogrel (PLAVIX) 75 MG tablet Take 1 tablet (75 mg total) by mouth daily. 02/07/15   Lavon Paganini Angiulli, PA-C  cyclobenzaprine (FLEXERIL) 10 MG tablet Take 1 tablet by mouth 2 (two) times daily as needed for spasms. 01/19/16   Historical Provider, MD  enoxaparin (LOVENOX) 80 MG/0.8ML injection Inject 0.8 mLs (80 mg total) into the skin daily. Patient not taking: Reported on 02/08/2016 02/01/16   Charlynne Cousins, MD  furosemide (LASIX) 20 MG tablet Take 1 tablet (20 mg total) by mouth every Monday, Wednesday, and Friday. Please start first dose on Wednesday 11/22 12/18/15   Albertine Patricia, MD  HYDROcodone-acetaminophen (NORCO/VICODIN) 5-325 MG tablet Take 1 tablet by mouth every 4 (four) hours as needed for severe pain. 09/26/15   [name], MD  Insulin Glargine (LANTUS SOLOSTAR) 100 UNIT/ML Solostar Pen Inject 10 Units into the skin at bedtime. 12/14/15   Silver Huguenin Elgergawy, MD  lisinopril (PRINIVIL,ZESTRIL) 10 MG tablet Take 10 mg by mouth every morning. 01/18/16   Historical Provider, MD  LYRICA 50 MG capsule Take 1 capsule (50 mg total) by mouth daily. Patient taking differently: Take 50 mg by mouth 2 (two) times daily.  07/14/15   Silver Huguenin Elgergawy, MD  metoprolol succinate (TOPROL-XL) 50 MG 24 hr tablet Take 50 mg by mouth daily. Take with or immediately following a meal.    Historical Provider, MD  ondansetron (ZOFRAN) 4 MG tablet Take 1 tablet by mouth every 8 (eight) hours as needed. 01/17/16   Historical Provider, MD  polyethylene glycol powder (MIRALAX) powder Take 17 g by mouth daily. 01/15/16   Jola Schmidt, MD  senna-docusate (SENOKOT-S) 8.6-50 MG tablet Take 1 tablet by mouth at bedtime. Do not take if diarrhea 11/29/15   Florencia Reasons, MD  sodium bicarbonate 650 MG tablet Take 1 tablet (650 mg total) by mouth daily. 11/29/15   Florencia Reasons, MD  traMADol (ULTRAM) 50 MG tablet take 1 TABLET BY MOUTH EVERY 12 hours 12/30/15   Charlett Blake, MD    warfarin (COUMADIN) 5 MG tablet Take 1 tablet (5 mg total) by mouth daily. 02/01/16   Charlynne Cousins, MD    Family History Family History  Problem Relation Age of Onset  . Diabetes Mother   . Stroke Maternal Aunt     Social History Social History  Substance Use Topics  . Smoking status: Never Smoker  . Smokeless tobacco: Never Used  . Alcohol use No     Allergies   Penicillins   Review of Systems Review of Systems  Constitutional: Negative for activity change, chills, diaphoresis, fatigue and fever.  HENT: Negative for congestion and rhinorrhea.   Eyes: Negative for visual disturbance.  Respiratory: Negative for cough, chest tightness, shortness of breath and stridor.   Cardiovascular: Negative for chest pain, palpitations and leg swelling.  Gastrointestinal: Positive  for abdominal pain, constipation, nausea and vomiting. Negative for abdominal distention, blood in stool and diarrhea.  Genitourinary: Negative for difficulty urinating, dysuria, flank pain, frequency, hematuria, menstrual problem, pelvic pain, vaginal bleeding and vaginal discharge.  Musculoskeletal: Negative for back pain and neck pain.  Skin: Negative for rash and wound.  Neurological: Negative for dizziness, weakness, light-headedness, numbness and headaches.  Psychiatric/Behavioral: Negative for agitation and confusion.  All other systems reviewed and are negative.    Physical Exam Updated Vital Signs BP 134/92 (BP Location: Right Arm)   Pulse 89   Temp 98.8 F (37.1 C) (Oral)   Resp 17   SpO2 99%   Physical Exam  Constitutional: She is oriented to person, place, and time. She appears well-developed and well-nourished. No distress.  HENT:  Head: Normocephalic and atraumatic.  Right Ear: External ear normal.  Left Ear: External ear normal.  Nose: Nose normal.  Mouth/Throat: Oropharynx is clear and moist. No oropharyngeal exudate.  Eyes: Conjunctivae and EOM are normal. Pupils are equal,  round, and reactive to light.  Neck: Normal range of motion. Neck supple.  Cardiovascular: Normal rate, regular rhythm, normal heart sounds and intact distal pulses.   No murmur heard. Pulmonary/Chest: No stridor. No respiratory distress. She has no wheezes. She exhibits no tenderness.  Abdominal: Normal appearance. She exhibits no distension. There is tenderness in the right lower quadrant. There is no rigidity, no rebound, no guarding and no CVA tenderness.    Musculoskeletal: She exhibits no tenderness.  Neurological: She is alert and oriented to person, place, and time. She has normal reflexes. She exhibits normal muscle tone. Coordination normal.  Skin: Skin is warm. Capillary refill takes less than 2 seconds. No rash noted. She is not diaphoretic. No erythema.  Nursing note and vitals reviewed.    ED Treatments / Results  Labs (all labs ordered are listed, but only abnormal results are displayed) Labs Reviewed  URINE CULTURE - Abnormal; Notable for the following:       Result Value   Culture MULTIPLE SPECIES PRESENT, SUGGEST RECOLLECTION (*)    All other components within normal limits  CBC WITH DIFFERENTIAL/PLATELET - Abnormal; Notable for the following:    RBC 3.13 (*)    Hemoglobin 9.0 (*)    HCT 28.0 (*)    RDW 16.6 (*)    All other components within normal limits  COMPREHENSIVE METABOLIC PANEL - Abnormal; Notable for the following:    Chloride 114 (*)    CO2 19 (*)    Glucose, Bld 123 (*)    BUN 32 (*)    Creatinine, Ser 2.59 (*)    Total Protein 6.1 (*)    Albumin 3.0 (*)    AST 13 (*)    ALT 13 (*)    GFR calc non Af Amer 18 (*)    GFR calc Af Amer 21 (*)    All other components within normal limits  URINALYSIS, ROUTINE W REFLEX MICROSCOPIC - Abnormal; Notable for the following:    Color, Urine AMBER (*)    APPearance TURBID (*)    Hgb urine dipstick MODERATE (*)    Protein, ur 30 (*)    Leukocytes, UA MODERATE (*)    Bacteria, UA FEW (*)    Squamous  Epithelial / LPF TOO NUMEROUS TO COUNT (*)    Non Squamous Epithelial 0-5 (*)    All other components within normal limits  LIPASE, BLOOD  I-STAT CG4 LACTIC ACID, ED  I-STAT CG4 LACTIC ACID,  ED    EKG  EKG Interpretation None       Radiology Ct Abdomen Pelvis Wo Contrast  Result Date: 02/14/2016 CLINICAL DATA:  Diffuse abdominal pain.  Nephrolithiasis. EXAM: CT ABDOMEN AND PELVIS WITHOUT CONTRAST TECHNIQUE: Multidetector CT imaging of the abdomen and pelvis was performed following the standard protocol without IV contrast. COMPARISON:  CT of the abdomen and pelvis 02/08/2016 FINDINGS: Lower chest: Bilateral lower lobe airspace disease likely reflects atelectasis. There is no significant consolidation. Heart is enlarged. There are no effusions. Extensive coronary artery calcifications are present. Hepatobiliary: No focal hepatic lesions are present. The common bile duct is within normal limits following cholecystectomy. Pancreas: Unremarkable. No pancreatic ductal dilatation or surrounding inflammatory changes. Spleen: Normal in size without focal abnormality. Adrenals/Urinary Tract: A right adrenal adenoma is stable. The left adrenal gland is normal. Bilateral nephrolithiasis is unchanged. No focal renal mass is present otherwise. The ureters are within normal limits. A Foley catheter decompresses the urinary bladder. Stomach/Bowel: A small hiatal hernia is present. Stomach and duodenum are otherwise within normal limits. The small bowel is unremarkable. Partial right hemicolectomy is noted. The colon is otherwise unremarkable. There is marked distention at the level of the rectum with a transverse diameter of 8 mm. There may be an intussusception of the distal sigmoid colon. No discrete mass lesion is seen. Vascular/Lymphatic: Atherosclerotic calcifications are present throughout the abdominal aorta and branch vessels without aneurysm. Reproductive: Hysterectomy is noted. Adnexae are within normal  limits for age. Other: No significant free fluid or free air is present. Musculoskeletal: Severe scoliosis and multilevel degenerative changes are stable. IMPRESSION: 1. Interval marked distention of the distal sigmoid colon and rectum with possible intussusception. Although no mass lesion is identified, intussusception can occur in the setting of a mass or polyp. Recommend follow-up with gastroenterology and correlation with most recent colonoscopy. 2. Stable bilateral nephrolithiasis without evidence for ureteral stone or obstruction. 3. Stable right adrenal adenoma. 4. Bilateral airspace disease likely reflects atelectasis. 5. Severe scoliosis and multilevel degenerative changes in the thoracolumbar spine. Electronically Signed   By: San Morelle M.D.   On: 02/14/2016 11:51    Procedures Procedures (including critical care time)  Medications Ordered in ED Medications  HYDROmorphone (DILAUDID) injection 1 mg (1 mg Intravenous Given 02/14/16 1022)  ondansetron (ZOFRAN) injection 4 mg (4 mg Intravenous Given 02/14/16 1022)  sodium chloride 0.9 % bolus 500 mL (0 mLs Intravenous Stopped 02/14/16 1144)     Initial Impression / Assessment and Plan / ED Course  I have reviewed the triage vital signs and the nursing notes.  Pertinent labs & imaging results that were available during my care of the patient were reviewed by me and considered in my medical decision making (see chart for details).    Michelle Holmes is a 66 y.o. female With a past medical history significant for colon cancer status post Partial colectomy and appendectomy, CAD, CVA, CKD, COPD, And reported constipation who presents with severe abdominal pain. Patient reports 10 out of 10 right lower quadrant abdominal pain. Patient reports having a BM earlier today.  History and exam are seen above. Patient found to have severe right lower quadrant abdominal tenderness. No CVA tenderness. No chest tenderness. Lungs clear. No significant  lower extremity edema.  Given significant pain, labs, Pain medicine, nausea medicine, Fluids, and CT scan were ordered.   CT scan reveals concern for intussusception. General surgery was called. General surgery evaluated patient and  Felt that patient had constipation.  Manual disimpaction Was attempted by surgery at the bedside. This was minimally successful and enema was ordered. Patient had some stool.  Lab testing showed similar creatinine to prior. No leukocytosis. Normal lactic acid and lipase. Urinalysis showed leukocytes and bacteria as well as squamous cells. Likely contaminated.   Surgery felt patient's symptoms were secondary to  Constipation. They recommend MiraLAX initiation and follow-up in several days. He did not feel patient had UTI based on likely contamination and  Lack of urinary symptoms.  Patient understood plan of care including recommendation to follow up with gastroenterology. Patient understood return precautions. Patient had improvement in abdominal pain. Patient discharged in good condition.    Final Clinical Impressions(s) / ED Diagnoses   Final diagnoses:  Right lower quadrant abdominal pain    New Prescriptions New Prescriptions   No medications on file    Clinical Impression: 1. Right lower quadrant abdominal pain     Disposition: Discharge  Condition: Good  I have discussed the results, Dx and Tx plan with the pt(& family if present). He/she/they expressed understanding and agree(s) with the plan. Discharge instructions discussed at great length. Strict return precautions discussed and pt &/or family have verbalized understanding of the instructions. No further questions at time of discharge.    New Prescriptions   No medications on file    Follow Up: Ricke Hey, MD [address] [place] [address] [phone]     [facility] EMERGENCY DEPARTMENT [address] [id number] mc Novinger  [place] Malakoff Harrison, MD 02/15/16 Lurline Hare

## 2016-02-14 NOTE — ED Notes (Signed)
Pt stable, understands discharge instructions, and reasons for return.   

## 2016-02-14 NOTE — Discharge Instructions (Signed)
Please take your MiraLAX once a day to help with your constipation. This should help with her abdominal pain and bowel movements. Please follow-up with your regular doctor in several days. Please consider seeing your gastroenterologist in follow-up as well. If any symptoms worsen, please return to the nearest emergency department.

## 2016-02-14 NOTE — ED Notes (Signed)
Michelle Holmes daughter has taken all of patients belongings to bring home. No belongings are left at bedside.

## 2016-02-14 NOTE — ED Triage Notes (Signed)
Pt here from home with c/o RLQ pain that started yesterday , lbm yesterday pt has been passing a kidney stone

## 2016-02-15 LAB — URINE CULTURE

## 2016-02-16 DIAGNOSIS — G4733 Obstructive sleep apnea (adult) (pediatric): Secondary | ICD-10-CM | POA: Diagnosis not present

## 2016-02-17 ENCOUNTER — Other Ambulatory Visit: Payer: Self-pay | Admitting: Internal Medicine

## 2016-02-17 ENCOUNTER — Telehealth: Payer: Self-pay

## 2016-02-17 ENCOUNTER — Ambulatory Visit (INDEPENDENT_AMBULATORY_CARE_PROVIDER_SITE_OTHER): Payer: Medicare Other | Admitting: Internal Medicine

## 2016-02-17 VITALS — BP 146/86 | HR 92 | Temp 98.2°F | Ht 64.0 in

## 2016-02-17 DIAGNOSIS — Z993 Dependence on wheelchair: Secondary | ICD-10-CM

## 2016-02-17 DIAGNOSIS — L89151 Pressure ulcer of sacral region, stage 1: Secondary | ICD-10-CM | POA: Diagnosis not present

## 2016-02-17 DIAGNOSIS — L89152 Pressure ulcer of sacral region, stage 2: Secondary | ICD-10-CM

## 2016-02-17 DIAGNOSIS — R338 Other retention of urine: Secondary | ICD-10-CM

## 2016-02-17 DIAGNOSIS — Z7401 Bed confinement status: Secondary | ICD-10-CM

## 2016-02-17 DIAGNOSIS — I69351 Hemiplegia and hemiparesis following cerebral infarction affecting right dominant side: Secondary | ICD-10-CM

## 2016-02-17 DIAGNOSIS — I82419 Acute embolism and thrombosis of unspecified femoral vein: Secondary | ICD-10-CM | POA: Insufficient documentation

## 2016-02-17 DIAGNOSIS — E669 Obesity, unspecified: Secondary | ICD-10-CM

## 2016-02-17 DIAGNOSIS — Z7901 Long term (current) use of anticoagulants: Secondary | ICD-10-CM | POA: Diagnosis not present

## 2016-02-17 DIAGNOSIS — Z960 Presence of urogenital implants: Secondary | ICD-10-CM

## 2016-02-17 DIAGNOSIS — N319 Neuromuscular dysfunction of bladder, unspecified: Secondary | ICD-10-CM

## 2016-02-17 DIAGNOSIS — E1100 Type 2 diabetes mellitus with hyperosmolarity without nonketotic hyperglycemic-hyperosmolar coma (NKHHC): Secondary | ICD-10-CM

## 2016-02-17 DIAGNOSIS — E119 Type 2 diabetes mellitus without complications: Secondary | ICD-10-CM

## 2016-02-17 DIAGNOSIS — Z9119 Patient's noncompliance with other medical treatment and regimen: Secondary | ICD-10-CM

## 2016-02-17 DIAGNOSIS — I82412 Acute embolism and thrombosis of left femoral vein: Secondary | ICD-10-CM | POA: Diagnosis not present

## 2016-02-17 LAB — POCT INR: INR: 1.2

## 2016-02-17 LAB — POCT GLYCOSYLATED HEMOGLOBIN (HGB A1C): Hemoglobin A1C: 5.8

## 2016-02-17 LAB — GLUCOSE, CAPILLARY: Glucose-Capillary: 125 mg/dL — ABNORMAL HIGH (ref 65–99)

## 2016-02-17 MED ORDER — WARFARIN SODIUM 5 MG PO TABS: 7.5000 mg | ORAL_TABLET | Freq: Every day | ORAL | 0 refills | Status: DC

## 2016-02-17 NOTE — Progress Notes (Signed)
CC: Establish care with new provider and management of sacral pressure injuries  HPI:  Michelle Holmes is a 66 y.o. woman with an extensive medical history including chronic anemia, CAD s/p PCI w/ stenting, COPD, hypertension, chronic kidney disease, mild HFpEF (Grade I diastolic dysfunction TTE 7/49/44), and multiple chronic lacunar CVAs with largely debilitating right spastic hemiparesis after thalamic CVA in June 2017. Her functional status has worsened significant ever since this last stroke as she is now bedridden and unable to transfer to wheelchair without full assistance due to no use of her right leg or arm. She is unable to perform most ADLs and currently receives home health aide, home health wound care through [facility] every 3 days, plus her daughter acting as primary caregivers. She previously was able to dress herself and walk around outside with a cane. She has a hospital bed in the home but this has still been complicated by sacral pressure injuries for which she was recently treated with antibiotics. She denies associated systemic complaints at this time but has significant pain daily from these wounds as she cannot adjust herself well and spends a large amount of time in a supine position.  She has been foley catheter dependent since October. She developed severe acute urinary retention suspected due to a neurogenic bladder. This leg to gross distention almost up to 2 liters with now suspected stretch injury. She failed a trial of catheter removal in November.  She has chronic mild leg swelling but due to increased left leg pain ans swelling ultrasound at the ED on 1/2 was checked and demonstrated indeterminate age thrombus at the common femoral vein. She was started on coumadin with lovenox bridging for anticoagulation based on her degree of chronic kidney disease.  She was also recently seen at the [facility] ED for constipation and abdominal pain. A week later her symptoms are  improved. She has not had major problems with this in the past few months until now. Of note she was prescribed oral opioid pain medications for her chronic pain and increased due to her pressure ulcers. She has since stopped taking these medications now or her previous tramadol since they were causing more drowsiness without actually resolving her pain very well.  She is noncompliant with CPAP at night but does have severe snoring and interrupted sleep. She feels the mask or nasal pillow variant were both too uncomfortable and removes them if placed with family assistance before sleep.  Of note she has been refusing almost all medical treatments since her hospitalization in October. She is taking antibiotics and anticoagulation as directed but is not on any of her other prescription medications. She complains that swallowing pills is too difficult. She also seems to be in very depressed mood and questions the value of taking all these treatments when her health is so poor.  See problem based assessment and plan below for additional details  Past Medical History:  Diagnosis Date  . Anemia   . CHF (congestive heart failure) ([place])   . Chronic back pain   . COPD (chronic obstructive pulmonary disease) ([place])   . Coronary artery disease   . Diabetes mellitus   . DJD (degenerative joint disease)   . Emphysema   . Hypercholesteremia   . Hypertension   . Myocardial infarction   . Obesity   . Obesity hypoventilation syndrome ([place])   . Renal insufficiency   . Scoliosis   . Sleep apnea   . Stroke ([place])   .  TIA (transient ischemic attack)    Family History: Mother - Type 2 diabetes Father - Deceased, medical problems unknown  Review of Systems:  Review of Systems  Constitutional: Negative for fever.  HENT: Negative for hearing loss.   Eyes: Positive for blurred vision.  Respiratory: Negative for cough and shortness of breath.   Cardiovascular: Positive for leg swelling. Negative for chest  pain.  Gastrointestinal: Positive for abdominal pain. Negative for blood in stool.  Genitourinary: Negative for hematuria.  Musculoskeletal: Positive for back pain.  Skin: Negative for rash.  Neurological: Negative for dizziness.  Endo/Heme/Allergies: Does not bruise/bleed easily.  Psychiatric/Behavioral: Positive for depression.    Physical Exam: Physical Exam  Constitutional:  Obese woman in wheelchair  Eyes: Conjunctivae are normal.  Cardiovascular: Normal rate and regular rhythm.   Pulmonary/Chest: Effort normal. She has no wheezes.  Abdominal: Soft. There is no tenderness.  Musculoskeletal:  Trace pedal edema bilaterally, no left calf or thigh pain  Neurological:  0/5 strength of right extremities and hand is somewhat contracted into claw position, right knee mobile with slightly hypertonic muscles, strength is 5/5 on left upper and lower extremities, CN II-XII grossly intact except no right shoulder shrug  Skin:  Dry, well appearing stage I pressure wound over right sacrum, left sacral pressure wound stage II macerated borders with a healthy appearing base, no purulent drainage noted  Psychiatric:  Flat, constricted affect Tearful during examination due to wound pain Difficulty on maintaining attention or concentration for examination tasks    Vitals:   02/17/16 1429  BP: (!) 146/86  Pulse: 92  Temp: 98.2 F (36.8 C)  TempSrc: Oral  SpO2: 99%  Height: 5\' 4"  (1.626 m)    Assessment & Plan:   See Encounters Tab for problem based charting.  Patient discussed with Dr. Dareen Piano

## 2016-02-17 NOTE — Patient Instructions (Signed)
It was a pleasure to see you today Michelle Holmes.  You have a clot in the left leg. It is not entirely certain how old this clot is but at this time I recommend continuing anticoagulation with coumadin to avoid the risk of this travelling to the lung which can be a life threatening event. You need an INR greater than 2 for adequate protection so increase the coumadin to 7.5mg  (1.5 pills) daily.  I think we will need to tackle many problems going forward but the first thing we can do is try to get you better treatment for your sores. I have referred you to the wound care center and you should receive a phone call to coordinate this. I will also talk with our clinic staff about if there is a better bed mattress you might be able to use to relieve pressure in the future.  I understand you greatly dislike taking medications at home so I will see what we can work on today. If your hemoglobin A1c remains good you may not need daily insulin shots at this time so we will check.

## 2016-02-17 NOTE — Telephone Encounter (Signed)
Denyse Amass from [facility] needs to speak with a nurse regarding pt.

## 2016-02-18 DIAGNOSIS — L89302 Pressure ulcer of unspecified buttock, stage 2: Secondary | ICD-10-CM | POA: Diagnosis not present

## 2016-02-18 DIAGNOSIS — Z7901 Long term (current) use of anticoagulants: Secondary | ICD-10-CM | POA: Diagnosis not present

## 2016-02-18 DIAGNOSIS — L89153 Pressure ulcer of sacral region, stage 3: Secondary | ICD-10-CM | POA: Diagnosis not present

## 2016-02-18 DIAGNOSIS — I959 Hypotension, unspecified: Secondary | ICD-10-CM | POA: Diagnosis not present

## 2016-02-19 ENCOUNTER — Encounter: Payer: Self-pay | Admitting: Internal Medicine

## 2016-02-19 NOTE — Assessment & Plan Note (Signed)
Hgb A1c today is 5.8% which is good news considering her not taking any oral or injectable treatments for her diabetes in the past 2-3 months.  I have discontinued these from her medication list since we do not need to restart treatment if she is already at goal.

## 2016-02-19 NOTE — Assessment & Plan Note (Signed)
She has 2 current pressure injuries over her sacrum. The right sided injury appears to be in good condition consistent with a stage I injury and is mildly tender. Her left injury is much worse with maceration and some undermining. It appears to be a stage II pressure injury at this time. I do not see significant purulence. She has a lot of pain to direct palpation and when adjusting her seated position. She will be at continued risk for similar injury so we need to maximise our home treatment for this since it is a major cause of pain and suffering as well as risk for negative health outcomes.  -Referral to wound care for evaluation -Will try and request for hospital bed gel mattress to better distribute pressure at home -Elizebeth Koller previously prescribed oral cephalopsporin -Follow up in a week

## 2016-02-19 NOTE — Assessment & Plan Note (Signed)
Her INR today is 1.2. Per home health this was reported at 1.5 when checked last week. In either case she is significantly subtherapeutic for anticoagulation with a left common femoral DVT. The thrombus was characterized as unknown age but based on this I think the benefit of treating to avoid the risk of embolization is enough to justify treating. Of note she has been mostly bedbound at home with several recent hospital visits while refusing her chronic antiplatelet therapy at home so there is an obvious risk present triggering this VTE.  -Increase coumadin to 7.5mg  daily and recheck in 1 week at clinic -Can try coordinate with [facility] home health about getting direct updates on home INR monitoring next week

## 2016-02-19 NOTE — Telephone Encounter (Signed)
[name] calls and ask if you would like them to continue wound care until 2/8 when she is scheduled at wound care ctr? The wound care will be wet to dry until wound care determines exactly what pt will need, ok? Can you order a gel overlay mattress for pt? [name] states pt is not turned and positioned a a regular basis, ok? Also they will do pt/ inr on 30th and call and fax results that day, are you ok with this? Do you approve [name] for 1x weekly for 3 weeks? Will revisit plan after pt sees wound care, ok?

## 2016-02-20 NOTE — Telephone Encounter (Signed)
I recommend continuing her current wound care until 2/8 and the wound care center can change recommendations based on their assessment. Wet to dry dressings should be fine for her sacral pressure injuries.  Gel mattress sounds like a fantastic plan. She has pressure injuries and is only becoming more debilitated over the past few months. She cannot reposition herself adequately. I don't know how to order that properly but already asked [place] about it earlier in the week.  Home health nursing weekly would be appropriate. She is very noncompliant on her medications which is worrisome.  INR on the 30th with same day reporting would be good. Her anticoagulation was changed this week for being subtherapeutic and needs follow up.    Please let me know which of these things need orders placed in the system versus can be relayed to the [facility] staff directly. Thanks.

## 2016-02-20 NOTE — Progress Notes (Signed)
Internal Medicine Clinic Attending  Case discussed with Dr. Rice at the time of the visit.  We reviewed the resident's history and exam and pertinent patient test results.  I agree with the assessment, diagnosis, and plan of care documented in the resident's note.  

## 2016-02-21 NOTE — Telephone Encounter (Signed)
Re confirmed w/ [name]

## 2016-02-22 DIAGNOSIS — J449 Chronic obstructive pulmonary disease, unspecified: Secondary | ICD-10-CM | POA: Diagnosis not present

## 2016-02-24 NOTE — Addendum Note (Signed)
Addended by: Collier Salina on: 02/24/2016 02:17 PM   Modules accepted: Orders

## 2016-02-25 ENCOUNTER — Telehealth: Payer: Self-pay | Admitting: *Deleted

## 2016-02-25 DIAGNOSIS — I959 Hypotension, unspecified: Secondary | ICD-10-CM | POA: Diagnosis not present

## 2016-02-25 DIAGNOSIS — L89153 Pressure ulcer of sacral region, stage 3: Secondary | ICD-10-CM | POA: Diagnosis not present

## 2016-02-25 DIAGNOSIS — L89302 Pressure ulcer of unspecified buttock, stage 2: Secondary | ICD-10-CM | POA: Diagnosis not present

## 2016-02-25 DIAGNOSIS — Z7901 Long term (current) use of anticoagulants: Secondary | ICD-10-CM | POA: Diagnosis not present

## 2016-02-25 NOTE — Telephone Encounter (Signed)
INR today 1.1 PT 13.0 [name] states per pt's daughter pt will only agree to take coumadin and prn zofran. Pt will not explain why she wants only to take these 2 meds Pt's daughter states she is taking her coumadin as prescribed He has not heard anything about mattress for pt, told him that dr rice had ordered it Need new PT/INR orders and new dosing orders

## 2016-02-25 NOTE — Telephone Encounter (Signed)
It is odd she is having no response to an increased dose of coumadin up to 7.5mg  daily for a week. INT 1.1 is still subtherapeutic with a goal over 2.0 for recent unknown age venous thromboembolism. I will discuss this case with Dr. Maudie Mercury for anticoagulation for her recommendations before changing her plan right now.

## 2016-02-25 NOTE — Telephone Encounter (Addendum)
Spoke to patient's daughter to increase dose to 10 mg (2 tablets) daily, re-check INR on Friday 02/28/16. Daughter [name]) verbalized understanding. INR orders placed with [facility] for Friday, 02/28/15.

## 2016-02-28 ENCOUNTER — Telehealth (INDEPENDENT_AMBULATORY_CARE_PROVIDER_SITE_OTHER): Payer: Medicare Other | Admitting: *Deleted

## 2016-02-28 DIAGNOSIS — Z7901 Long term (current) use of anticoagulants: Secondary | ICD-10-CM

## 2016-02-28 DIAGNOSIS — I82412 Acute embolism and thrombosis of left femoral vein: Secondary | ICD-10-CM

## 2016-02-28 DIAGNOSIS — L89153 Pressure ulcer of sacral region, stage 3: Secondary | ICD-10-CM | POA: Diagnosis not present

## 2016-02-28 DIAGNOSIS — I959 Hypotension, unspecified: Secondary | ICD-10-CM | POA: Diagnosis not present

## 2016-02-28 DIAGNOSIS — L89302 Pressure ulcer of unspecified buttock, stage 2: Secondary | ICD-10-CM | POA: Diagnosis not present

## 2016-02-28 LAB — POCT INR: INR: 1.5

## 2016-02-28 NOTE — Telephone Encounter (Signed)
Call from [facility] from [place] - states pt's INR is 1.5 today.PT-18.2. States the daughter has been making sure pt is taking Coumadin 10 mg daily. States no change in diet (no greens); no bleeding noted. Thanks

## 2016-02-28 NOTE — Progress Notes (Signed)
Anticoagulation Management Michelle Holmes is a 66 y.o. female who reports to the clinic for monitoring of warfarin treatment.    Indication: DVT current, undetermined age Duration: indefinite Supervising physician: Aldine Contes  Anticoagulation Clinic Visit History: Patient does not report signs/symptoms of bleeding or thromboembolism   Anticoagulation Episode Summary    Current INR goal:   2.0-3.0  TTR:   -  Next INR check:   03/03/2016  INR from last check:   1.5! (02/28/2016)  Weekly max dose:     Target end date:     INR check location:     Preferred lab:     Send INR reminders to:      Indications   Dvt femoral (deep venous thrombosis) ([place]) [I82.419]       Comments:          ASSESSMENT Recent Results: The most recent result is correlated with 60 mg per week: Lab Results  Component Value Date   INR 1.5 02/28/2016   INR 1.2 02/17/2016   INR 1.16 02/01/2016   Anticoagulation Dosing: INR as of 02/28/2016 and Previous Dosing Information    INR Dt INR Goal Michelle Holmes Sun Mon Tue Wed Thu Fri Sat   02/28/2016 1.5 - 60 mg 7.5 mg 7.5 mg 10 mg 10 mg 10 mg 7.5 mg 7.5 mg   Patient deviated from recommended dosing.       Anticoagulation Dose Instructions as of 02/28/2016      Total Sun Mon Tue Wed Thu Fri Sat   New Dose 70 mg 10 mg 10 mg 10 mg 10 mg 10 mg 10 mg 10 mg     (5 mg x 2)  (5 mg x 2)  (5 mg x 2)  (5 mg x 2)  (5 mg x 2)  (5 mg x 2)  (5 mg x 2)                           INR today: Subtherapeutic  PLAN Weekly dose was increased by 17%  Patient Instructions  Patient educated about medication as defined in this encounter and verbalized understanding by repeating back instructions provided.   Patient advised to contact clinic or seek medical attention if signs/symptoms of bleeding or thromboembolism occur.  Patient verbalized understanding by repeating back information and was advised to contact me if further medication-related questions arise. Patient was also  provided an information handout.  Follow-up 4 days  [name]

## 2016-02-28 NOTE — Patient Instructions (Signed)
Patient educated about medication as defined in this encounter and verbalized understanding by repeating back instructions provided.   

## 2016-03-02 DIAGNOSIS — L89153 Pressure ulcer of sacral region, stage 3: Secondary | ICD-10-CM | POA: Diagnosis not present

## 2016-03-03 ENCOUNTER — Telehealth (INDEPENDENT_AMBULATORY_CARE_PROVIDER_SITE_OTHER): Payer: Medicare Other | Admitting: Pharmacist

## 2016-03-03 ENCOUNTER — Telehealth: Payer: Self-pay | Admitting: *Deleted

## 2016-03-03 DIAGNOSIS — L89302 Pressure ulcer of unspecified buttock, stage 2: Secondary | ICD-10-CM | POA: Diagnosis not present

## 2016-03-03 DIAGNOSIS — I82412 Acute embolism and thrombosis of left femoral vein: Secondary | ICD-10-CM | POA: Diagnosis not present

## 2016-03-03 DIAGNOSIS — Z7901 Long term (current) use of anticoagulants: Secondary | ICD-10-CM

## 2016-03-03 DIAGNOSIS — I959 Hypotension, unspecified: Secondary | ICD-10-CM | POA: Diagnosis not present

## 2016-03-03 DIAGNOSIS — L89153 Pressure ulcer of sacral region, stage 3: Secondary | ICD-10-CM | POA: Diagnosis not present

## 2016-03-03 LAB — POCT INR: INR: 3.5

## 2016-03-03 NOTE — Telephone Encounter (Signed)
Today: INR- 3.5 PT- 41.6 Pt has been taking 10mg  coumadin as ordered Sending to dr's kim, boswell and attending

## 2016-03-03 NOTE — Telephone Encounter (Signed)
Michelle Holmes [facility]  calls back and states wound is worse it is deepening and drainage is thicker, he would like to change wound care until appt 2/8 to calcium alginate, do you agree?

## 2016-03-03 NOTE — Progress Notes (Addendum)
Anticoagulation Management Michelle Holmes is a 66 y.o. female who reports to the clinic for monitoring of warfarin treatment.    Indication: DVT  Duration: indefinite Supervising physician: Aldine Contes  Anticoagulation Clinic Visit History: Patient does not report signs/symptoms of bleeding or thromboembolism   Anticoagulation Episode Summary    Current INR goal:   2.0-3.0  TTR:   -  Next INR check:   03/05/2016  INR from last check:     Most recent INR:    3.5! (03/06/2016)  Weekly max dose:     Target end date:     INR check location:     Preferred lab:     Send INR reminders to:      Indications   Dvt femoral (deep venous thrombosis) ([name]) [I82.419]       Comments:          ASSESSMENT Recent Results: The most recent result is correlated with 70 mg per week: Lab Results  Component Value Date   INR 3.5 03/06/2016   INR 1.5 02/28/2016   INR 1.2 02/17/2016   Anticoagulation Dosing: INR as of 03/03/2016 and Previous Dosing Information    INR Dt INR Goal Wkly Tot Sun Mon Tue Wed Thu Fri Sat     2.0-3.0 70 mg 10 mg 10 mg 10 mg 10 mg 10 mg 10 mg 10 mg    Anticoagulation Dose Instructions as of 03/03/2016      Total Sun Mon Tue Wed Thu Fri Sat   New Dose 65 mg 10 mg 10 mg 7.5 mg 7.5 mg 10 mg 10 mg 10 mg     (5 mg x 2)  (5 mg x 2)  (5 mg x 1.5)  (5 mg x 1.5)  (5 mg x 2)  (5 mg x 2)  (5 mg x 2)                           INR today: Supratherapeutic  PLAN Weekly dose was decreased by 14% to 65 mg per week  Patient Instructions  Patient educated about medication as defined in this encounter and verbalized understanding by repeating back instructions provided.   Patient advised to contact clinic or seek medical attention if signs/symptoms of bleeding or thromboembolism occur.  Patient verbalized understanding by repeating back information and was advised to contact me if further medication-related questions arise. Patient was also provided an information  handout.  Follow-up 2 days  [name]

## 2016-03-03 NOTE — Patient Instructions (Signed)
Patient educated about medication as defined in this encounter and verbalized understanding by repeating back instructions provided.   

## 2016-03-03 NOTE — Telephone Encounter (Signed)
I agree

## 2016-03-03 NOTE — Telephone Encounter (Signed)
I spoke to patients daughter to reduce warfarin to 1.5 tablets today and tomorrow. If you get in touch with Denyse Amass about the home health visit 2/8, can you ask him to check an INR that day as well?  Thank you

## 2016-03-04 NOTE — Telephone Encounter (Signed)
Spoke w/ Denyse Amass this am

## 2016-03-05 ENCOUNTER — Ambulatory Visit (INDEPENDENT_AMBULATORY_CARE_PROVIDER_SITE_OTHER): Payer: Medicare Other | Admitting: Internal Medicine

## 2016-03-05 ENCOUNTER — Telehealth (INDEPENDENT_AMBULATORY_CARE_PROVIDER_SITE_OTHER): Payer: Medicare Other | Admitting: *Deleted

## 2016-03-05 ENCOUNTER — Encounter: Payer: Self-pay | Admitting: Internal Medicine

## 2016-03-05 ENCOUNTER — Encounter (HOSPITAL_BASED_OUTPATIENT_CLINIC_OR_DEPARTMENT_OTHER): Payer: Medicare Other | Attending: Internal Medicine

## 2016-03-05 VITALS — BP 146/87 | HR 100 | Temp 98.4°F

## 2016-03-05 DIAGNOSIS — I10 Essential (primary) hypertension: Secondary | ICD-10-CM | POA: Insufficient documentation

## 2016-03-05 DIAGNOSIS — Z7901 Long term (current) use of anticoagulants: Secondary | ICD-10-CM

## 2016-03-05 DIAGNOSIS — I82412 Acute embolism and thrombosis of left femoral vein: Secondary | ICD-10-CM

## 2016-03-05 DIAGNOSIS — Z79899 Other long term (current) drug therapy: Secondary | ICD-10-CM | POA: Diagnosis not present

## 2016-03-05 DIAGNOSIS — I252 Old myocardial infarction: Secondary | ICD-10-CM | POA: Diagnosis not present

## 2016-03-05 DIAGNOSIS — I69351 Hemiplegia and hemiparesis following cerebral infarction affecting right dominant side: Secondary | ICD-10-CM

## 2016-03-05 DIAGNOSIS — G4730 Sleep apnea, unspecified: Secondary | ICD-10-CM | POA: Insufficient documentation

## 2016-03-05 DIAGNOSIS — L89150 Pressure ulcer of sacral region, unstageable: Secondary | ICD-10-CM | POA: Diagnosis not present

## 2016-03-05 DIAGNOSIS — Z86718 Personal history of other venous thrombosis and embolism: Secondary | ICD-10-CM | POA: Diagnosis not present

## 2016-03-05 DIAGNOSIS — G8929 Other chronic pain: Secondary | ICD-10-CM | POA: Diagnosis not present

## 2016-03-05 DIAGNOSIS — L89153 Pressure ulcer of sacral region, stage 3: Secondary | ICD-10-CM

## 2016-03-05 DIAGNOSIS — Z993 Dependence on wheelchair: Secondary | ICD-10-CM | POA: Diagnosis not present

## 2016-03-05 DIAGNOSIS — Z79891 Long term (current) use of opiate analgesic: Secondary | ICD-10-CM | POA: Insufficient documentation

## 2016-03-05 DIAGNOSIS — F1120 Opioid dependence, uncomplicated: Secondary | ICD-10-CM

## 2016-03-05 DIAGNOSIS — L89622 Pressure ulcer of left heel, stage 2: Secondary | ICD-10-CM | POA: Diagnosis not present

## 2016-03-05 DIAGNOSIS — I2510 Atherosclerotic heart disease of native coronary artery without angina pectoris: Secondary | ICD-10-CM | POA: Insufficient documentation

## 2016-03-05 DIAGNOSIS — J449 Chronic obstructive pulmonary disease, unspecified: Secondary | ICD-10-CM | POA: Diagnosis not present

## 2016-03-05 DIAGNOSIS — L89302 Pressure ulcer of unspecified buttock, stage 2: Secondary | ICD-10-CM | POA: Diagnosis not present

## 2016-03-05 DIAGNOSIS — Z923 Personal history of irradiation: Secondary | ICD-10-CM | POA: Insufficient documentation

## 2016-03-05 DIAGNOSIS — I959 Hypotension, unspecified: Secondary | ICD-10-CM | POA: Diagnosis not present

## 2016-03-05 DIAGNOSIS — L89152 Pressure ulcer of sacral region, stage 2: Secondary | ICD-10-CM

## 2016-03-05 DIAGNOSIS — Z9221 Personal history of antineoplastic chemotherapy: Secondary | ICD-10-CM | POA: Insufficient documentation

## 2016-03-05 DIAGNOSIS — Z7401 Bed confinement status: Secondary | ICD-10-CM | POA: Diagnosis not present

## 2016-03-05 DIAGNOSIS — E119 Type 2 diabetes mellitus without complications: Secondary | ICD-10-CM | POA: Diagnosis not present

## 2016-03-05 LAB — POCT INR: INR: 3.7

## 2016-03-05 MED ORDER — OXYCODONE HCL 20 MG PO TABS: 20.0000 mg | ORAL_TABLET | Freq: Two times a day (BID) | ORAL | 0 refills | Status: DC | PRN

## 2016-03-05 MED ORDER — OXYCODONE HCL 20 MG PO TABS: 1.0000 | ORAL_TABLET | Freq: Two times a day (BID) | ORAL | 0 refills | Status: DC | PRN

## 2016-03-05 MED ORDER — AMLODIPINE BESYLATE 5 MG PO TABS: 5.0000 mg | ORAL_TABLET | Freq: Every day | ORAL | 11 refills | Status: DC

## 2016-03-05 NOTE — Assessment & Plan Note (Signed)
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 Her most recent stroke was in June 2017. She is mostly bedbound and wheelchair-bound. She requires full assistance for transferring. She lives with her daughter and son who take care of her. Patient has previously been on chronic opioid therapy with oxycodone 20 mg 3 times a day as needed. She has also recently received tramadol. Patient stopped taking all of her medications particularly her pain medications due to fatigue and dislike eating swallowing pills. She received a prescription for oxycodone 20 mg 3 times a day 90 pills on January 8, but both patient and family stated that they destroyed these medications since patient would refuse to take them. 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 She is tearful on exam. She is willing to take pain medication and began to regain some quality of life. She denies problem with constipation, but states she has MiraLAX at home if needed.  Michelle Holmes is on chronic opioid therapy for chronic pain. The date of the controlled substances contract is referenced in the [place] and / or the overview. Date of pain contract was 03/05/16 with Dr. Quay Burow. As part of the treatment plan, the [place] controlled substance database is checked at least twice yearly and the database results are appropriate. I have last reviewed the results on 03/05/16.   The last UDS was on 03/05/16 and results are as expected. Patient needs at least a yearly UDS.   The patient is on oxycodone (Oxycontin, Oxyir) strength 20 mg BID prn, 60 per 30 days. This regimen allows Michelle Holmes to function and does not cause excessive sedation or other side effects.  "The benefits of continuing opioid therapy outweigh the risks and chronic opioids will be continued. Ongoing education about safe opioid  treatment is provided  Interventions today include: UDS, Refills - 3 paper Rx printed, Refills - 2 paper Rx printed and Refill - 1 paper Rx printed

## 2016-03-05 NOTE — Assessment & Plan Note (Signed)
Patient is on Coumadin for a left lower extremity DVT. This is not the patient's first blood clot per family. I suspect she will be on long-term anticoagulation with Coumadin due to her renal function for her DVT. Her last INR was 1.5 on February 2. Home health is planning on rechecking her Coumadin today at 4 PM. Heart clinic is helping to adjust her dose.  Plan: Continue Coumadin

## 2016-03-05 NOTE — Progress Notes (Signed)
CC: Follow-up for chronic pain  HPI:  [name] is a 66 y.o. female with PMHx of chronic systolic heart failure, hypertension, COPD, CKD, chronic lacunar strokes with spastic right hemiparesis who presents to the clinic for follow-up for chronic pain.   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 Her most recent stroke was in June 2017. She is mostly bedbound and wheelchair-bound. She requires full assistance for transferring. She lives with her daughter and son who take care of her. Patient has previously been on chronic opioid therapy with oxycodone 20 mg 3 times a day as needed. She has also recently received tramadol. Patient stopped taking all of her medications particularly her pain medications due to fatigue and dislike eating swallowing pills. She received a prescription for oxycodone 20 mg 3 times a day 90 pills on January 8, but both patient and family stated that they destroyed these medications since patient would refuse to take them. 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 She is tearful on exam. She is willing to take pain medication and began to regain some quality of life. She denies problem with constipation, but states she has MiraLAX at home if needed.  Patient is on Coumadin for a left lower extremity DVT. This is not the patient's first blood clot per family. I suspect she will be on long-term anticoagulation with Coumadin due to her renal function for her DVT. Her last INR was 1.5 on February 2. Home health is planning on rechecking her Coumadin today at 4 PM. Heart clinic is helping to adjust her dose.  Patient has hypertension. Her blood pressure today is 146/87. She is supposed to be on amlodipine 5 mg once a day, metoprolol 50 mg once a day and lisinopril 10 mg once a day. However, patient  hasn't been taking any of her medications due to her dislike of swallowing pills. Her daughter reports that she is very stubborn.  Past Medical History:  Diagnosis Date  . Anemia   . CHF (congestive heart failure) ([place])   . Chronic back pain   . COPD (chronic obstructive pulmonary disease) ([place])   . Coronary artery disease   . Diabetes mellitus   . DJD (degenerative joint disease)   . Emphysema   . Hypercholesteremia   . Hypertension   . Myocardial infarction   . Obesity   . Obesity hypoventilation syndrome ([place])   . Renal insufficiency   . Scoliosis   . Sleep apnea   . Stroke ([place])   . TIA (transient ischemic attack)      Review of Systems: Please see pertinent ROS reviewed in HPI and problem based charting.   Physical Exam: Vitals:   03/05/16 1411  BP: (!) 146/87  Pulse: 100  Temp: 98.4 F (36.9 C)  TempSrc: Oral  SpO2: 100%   General: Vital signs reviewed.  Patient is Chronically ill-appearing, in mild acute distress and cooperative with exam.  Cardiovascular: RRR, S1 normal, S2 normal, no murmurs, gallops, or rubs. Pulmonary/Chest: Clear to auscultation bilaterally, no wheezes, rales, or rhonchi. Abdominal: Soft, non-tender, non-distended, BS + Extremities: Spastic hemiparesis of her right upper and lower extremities. No lower extremity edema bilaterally. Neurological: A&O x2, Strength is 0 out of 5 in right upper and right lower extremities. 3 out of 5 in left upper and  left lower extremities. sensory intact to light touch bilaterally.  Skin: Warm, dry and intact. Xerotic skin changes on lower extremities bilaterally  Psychiatric: Tearful on exam. Pleasant.  Assessment & Plan:  See encounters tab for problem based medical decision making. Patient discussed with Dr. Daryll Drown

## 2016-03-05 NOTE — Assessment & Plan Note (Signed)
BP Readings from Last 3 Encounters:  03/05/16 (!) 146/87  02/17/16 (!) 146/86  02/14/16 138/72    Lab Results  Component Value Date   NA 140 02/14/2016   K 5.0 02/14/2016   CREATININE 2.59 (H) 02/14/2016    Assessment: Blood pressure control:  uncontrolled Progress toward BP goal:   above goal Comments:  Patient has hypertension. Her blood pressure today is 146/87. She is supposed to be on amlodipine 5 mg once a day, metoprolol 50 mg once a day and lisinopril 10 mg once a day. However, patient hasn't been taking any of her medications due to her dislike of swallowing pills. Her daughter reports that she is very stubborn.  Plan: Medications:  Encouraged patient to take amlodipine 5 mg once a day. Other plans: Follow up in 2 months.

## 2016-03-05 NOTE — Assessment & Plan Note (Signed)
Patient is following with wound care for a chronic stage III sacral ulcer. Her daughter is helping her with the wound dressing changes. They have advanced home care coming out to the house as well.

## 2016-03-05 NOTE — Telephone Encounter (Signed)
Denyse Amass calls with results of INR and PT INR    3.7 PT      14.0 Has been taking 7.5mg   [facility]se[facility] # [phone]

## 2016-03-05 NOTE — Patient Instructions (Signed)
Continue all medications as prescribed. Continue to follow-up with wound care for your pressure ulcer. If you develop any fevers or chills, please let us know. Continue to take her warfarin for your DVT. Please take amlodipine 5 mg once a day for your blood pressure. You can take oxycodone 20 mg twice a day as needed for your chronic pain.  Please follow up in 3 months.

## 2016-03-06 DIAGNOSIS — L89153 Pressure ulcer of sacral region, stage 3: Secondary | ICD-10-CM | POA: Diagnosis not present

## 2016-03-06 NOTE — Addendum Note (Signed)
Addended by: Forde Dandy on: 03/06/2016 06:01 PM   Modules accepted: Level of Service

## 2016-03-06 NOTE — Patient Instructions (Signed)
Patient educated about medication as defined in this encounter and verbalized understanding by repeating back instructions provided.   

## 2016-03-06 NOTE — Telephone Encounter (Signed)
Spoke to [place] to coordinate care and contacted patient's daughter with instructions.

## 2016-03-06 NOTE — Progress Notes (Signed)
Anticoagulation Management Michelle Holmes is a 66 y.o. female who was contacted for warfarin management. Spoke to patient's daughter, and home health nurse, Denyse Amass, to coordinate care.    Indication: DVT identified 01/30/16, patient's family reports history of recurrence Duration: indefinite Supervising physician: Murriel Hopper  Anticoagulation Clinic Visit History: Patient does not report signs/symptoms of bleeding or thromboembolism   Anticoagulation Episode Summary    Current INR goal:   2.0-3.0  TTR:   -  Next INR check:   03/10/2016  INR from last check:   3.7! (03/05/2016)  Weekly max dose:     Target end date:     INR check location:     Preferred lab:     Send INR reminders to:      Indications   Dvt femoral (deep venous thrombosis) ([place]) [I82.419]       Comments:          ASSESSMENT Recent Results: The most recent result is correlated with 65 mg per week: Lab Results  Component Value Date   INR 3.7 03/05/2016   INR 3.5 03/03/2016   INR 1.5 02/28/2016   Anticoagulation Dosing: INR as of 03/05/2016 and Previous Dosing Information    INR Dt INR Goal [employer] Sun Mon Tue Wed Thu Fri Sat   03/05/2016 3.7 2.0-3.0 65 mg 10 mg 10 mg 7.5 mg 7.5 mg 10 mg 10 mg 10 mg    Anticoagulation Dose Instructions as of 03/05/2016      Total Sun Mon Tue Wed Thu Fri Sat   New Dose 57.5 mg 10 mg 7.5 mg 7.5 mg 10 mg 5 mg 7.5 mg 10 mg     (5 mg x 2)  (5 mg x 1.5)  (5 mg x 1.5)  (5 mg x 2)  (5 mg x 1)  (5 mg x 1.5)  (5 mg x 2)                           INR today: Supratherapeutic  PLAN Weekly dose was decreased by 12% to 57.5 mg per week. Did not reduce more than this amount since patient was previously subtherapeutic (INR 1.5) on a dose of 52.5 mg/week.  Patient Instructions  Patient educated about medication as defined in this encounter and verbalized understanding by repeating back instructions provided.   Patient advised to contact clinic or seek medical attention if  signs/symptoms of bleeding or thromboembolism occur.  Patient verbalized understanding by repeating back information and was advised to contact me if further medication-related questions arise. Patient was also provided an information handout.  Follow-up 4 days, working with daughter to schedule a face-to-face warfarin visit next week  [name]

## 2016-03-09 NOTE — Telephone Encounter (Signed)
Reviewed Thanks DrG 

## 2016-03-10 ENCOUNTER — Telehealth (INDEPENDENT_AMBULATORY_CARE_PROVIDER_SITE_OTHER): Payer: Medicare Other | Admitting: *Deleted

## 2016-03-10 DIAGNOSIS — I82412 Acute embolism and thrombosis of left femoral vein: Secondary | ICD-10-CM

## 2016-03-10 DIAGNOSIS — Z7901 Long term (current) use of anticoagulants: Secondary | ICD-10-CM

## 2016-03-10 DIAGNOSIS — I959 Hypotension, unspecified: Secondary | ICD-10-CM | POA: Diagnosis not present

## 2016-03-10 DIAGNOSIS — L89153 Pressure ulcer of sacral region, stage 3: Secondary | ICD-10-CM | POA: Diagnosis not present

## 2016-03-10 DIAGNOSIS — L89302 Pressure ulcer of unspecified buttock, stage 2: Secondary | ICD-10-CM | POA: Diagnosis not present

## 2016-03-10 LAB — POCT INR: INR: 1.4

## 2016-03-10 NOTE — Progress Notes (Addendum)
Anticoagulation Management Michelle Holmes is a 66 y.o. female who was contacted for warfarin management. Spoke with patient's daughter, Michelle Holmes, and collaborated care with [place], [facility] with [place].  Indication: DVT  Duration: indefinite Supervising physician: Larey Dresser  Anticoagulation Clinic Visit History: Patient does not report signs/symptoms of bleeding or thromboembolism   Anticoagulation Episode Summary    Current INR goal:   2.0-3.0  TTR:   26.2 % (1 d)  Next INR check:   03/13/2016  INR from last check:   1.4! (03/10/2016)  Weekly max dose:     Target end date:     INR check location:     Preferred lab:     Send INR reminders to:      Indications   Dvt femoral (deep venous thrombosis) ([place]) [I82.419]       Comments:          ASSESSMENT Recent Results: The most recent result is correlated with 57.5 mg per week: Lab Results  Component Value Date   INR 1.4 03/10/2016   INR 3.7 03/05/2016   INR 3.5 03/03/2016   Anticoagulation Dosing: INR as of 03/10/2016 and Previous Dosing Information    INR Dt INR Goal [employer] Sun Mon Tue Wed Thu Fri Sat   03/10/2016 1.4 2.0-3.0 57.5 mg 10 mg 7.5 mg 7.5 mg 10 mg 5 mg 7.5 mg 10 mg    Anticoagulation Dose Instructions as of 03/10/2016      Total Sun Mon Tue Wed Thu Fri Sat   New Dose 62.5 mg 10 mg 7.5 mg 10 mg 10 mg 7.5 mg 7.5 mg 10 mg     (5 mg x 2)  (5 mg x 1.5)  (5 mg x 2)  (5 mg x 2)  (5 mg x 1.5)  (5 mg x 1.5)  (5 mg x 2)                           INR today: Subtherapeutic  PLAN Weekly dose was increased by 9% to 62.5 mg per week  Patient Instructions  Patient educated about medication as defined in this encounter and verbalized understanding by repeating back instructions provided.   Patient advised to contact clinic or seek medical attention if signs/symptoms of bleeding or thromboembolism occur.  Patient verbalized understanding by repeating back information and was advised to contact me if further  medication-related questions arise. Patient was also provided an information handout.  Follow-up INR in 3 days. Patient was also scheduled for office warfarin clinic appointment on 03/24/16  [name]

## 2016-03-10 NOTE — Telephone Encounter (Signed)
Denyse Amass called for PT/INR results: INR:  1.4 PT: 16.4 Has taken coumadin dose as instructed.  Denyse Amass Doctor, general practice nurse) [phone]

## 2016-03-10 NOTE — Patient Instructions (Signed)
Patient educated about medication as defined in this encounter and verbalized understanding by repeating back instructions provided.   

## 2016-03-13 ENCOUNTER — Telehealth (INDEPENDENT_AMBULATORY_CARE_PROVIDER_SITE_OTHER): Payer: Medicare Other | Admitting: Pharmacist

## 2016-03-13 ENCOUNTER — Telehealth: Payer: Self-pay

## 2016-03-13 ENCOUNTER — Other Ambulatory Visit: Payer: Self-pay | Admitting: Internal Medicine

## 2016-03-13 DIAGNOSIS — I959 Hypotension, unspecified: Secondary | ICD-10-CM | POA: Diagnosis not present

## 2016-03-13 DIAGNOSIS — I82412 Acute embolism and thrombosis of left femoral vein: Secondary | ICD-10-CM

## 2016-03-13 DIAGNOSIS — Z7901 Long term (current) use of anticoagulants: Secondary | ICD-10-CM | POA: Diagnosis not present

## 2016-03-13 DIAGNOSIS — L89302 Pressure ulcer of unspecified buttock, stage 2: Secondary | ICD-10-CM | POA: Diagnosis not present

## 2016-03-13 DIAGNOSIS — L89153 Pressure ulcer of sacral region, stage 3: Secondary | ICD-10-CM | POA: Diagnosis not present

## 2016-03-13 LAB — POCT INR: INR: 1.2

## 2016-03-13 LAB — TOXASSURE SELECT,+ANTIDEPR,UR

## 2016-03-13 MED ORDER — ENOXAPARIN SODIUM 120 MG/0.8ML ~~LOC~~ SOLN: 120.0000 mg | SUBCUTANEOUS | 0 refills | Status: DC

## 2016-03-13 MED ORDER — WARFARIN SODIUM 5 MG PO TABS: 10.0000 mg | ORAL_TABLET | Freq: Every day | ORAL | 0 refills | Status: DC

## 2016-03-13 NOTE — Progress Notes (Addendum)
Anticoagulation Management Michelle Holmes is a 66 y.o. female being monitored for warfarin therapy in collaboration with [facility], [place], from [place], who checked INR today. Spoke to patient's daughter, [name], who manages medications for patient at home.  Indication: DVT identified 01/30/16, family reports history of recurrence Duration: indefinite Supervising physician: Larey Dresser  Anticoagulation Clinic Visit History: Patient does not report signs/symptoms of bleeding or thromboembolism   Anticoagulation Episode Summary    Current INR goal:   2.0-3.0  TTR:   9.9 % (4 d)  Next INR check:   03/16/2016  INR from last check:   1.2! (03/13/2016)  Weekly max dose:     Target end date:     INR check location:     Preferred lab:     Send INR reminders to:      Indications   Dvt femoral (deep venous thrombosis) (HCC) [I82.419]       Comments:          ASSESSMENT Recent Results: Lab Results  Component Value Date   INR 1.2 03/13/2016   INR 1.4 03/10/2016   INR 3.7 03/05/2016   Anticoagulation Dosing: INR as of 03/13/2016 and Previous Dosing Information    INR Dt INR Goal [employer] Sun Mon Tue Wed Thu Fri Sat   03/13/2016 1.2 2.0-3.0 62.5 mg 10 mg 7.5 mg 10 mg 10 mg 7.5 mg 7.5 mg 10 mg    Anticoagulation Dose Instructions as of 03/13/2016      Total Sun Mon Tue Wed Thu Fri Sat   New Dose 65 mg 10 mg 7.5 mg 10 mg 10 mg 7.5 mg 10 mg 10 mg     (5 mg x 2)  (5 mg x 1.5)  (5 mg x 2)  (5 mg x 2)  (5 mg x 1.5)  (5 mg x 2)  (5 mg x 2)                           INR today: Subtherapeutic  PLAN Take 2 tablets (10 mg) daily, re-check INR Monday 03/16/16. Initiate enoxaparin 1.5 mg/kg daily. Although [name] states that patient is taking warfarin as prescribed, I reinforced the importance of adherence Denyse Amass, [name], did express concern for non-adherence).  Patient Instructions  Patient educated about medication as defined in this encounter and verbalized understanding by repeating back  instructions provided.   Patient advised to contact clinic or seek medical attention if signs/symptoms of bleeding or thromboembolism occur.  Patient verbalized understanding by repeating back information and was advised to contact me if further medication-related questions arise. Patient was also provided an information handout.  Follow-up 3 days, patient unable to report next week for a clinic visit due to transportation barriers. Will continue to collaborate with [facility], coumadin clinic appointment scheduled for 03/24/16.  [name]

## 2016-03-13 NOTE — Patient Instructions (Signed)
Patient educated about medication as defined in this encounter and verbalized understanding by repeating back instructions provided.   

## 2016-03-13 NOTE — Telephone Encounter (Signed)
Call from [place] at [place] reports INR 2.1

## 2016-03-14 ENCOUNTER — Telehealth: Payer: Self-pay | Admitting: Internal Medicine

## 2016-03-14 ENCOUNTER — Emergency Department (HOSPITAL_COMMUNITY)
Admission: EM | Admit: 2016-03-14 | Discharge: 2016-03-14 | Disposition: A | Discharge status: Home / Self Care | Payer: Medicare Other | Attending: Emergency Medicine | Admitting: Emergency Medicine

## 2016-03-14 ENCOUNTER — Encounter (HOSPITAL_COMMUNITY): Payer: Self-pay

## 2016-03-14 DIAGNOSIS — Y732 Prosthetic and other implants, materials and accessory gastroenterology and urology devices associated with adverse incidents: Secondary | ICD-10-CM | POA: Diagnosis not present

## 2016-03-14 DIAGNOSIS — T839XXA Unspecified complication of genitourinary prosthetic device, implant and graft, initial encounter: Secondary | ICD-10-CM

## 2016-03-14 DIAGNOSIS — R339 Retention of urine, unspecified: Secondary | ICD-10-CM

## 2016-03-14 DIAGNOSIS — I130 Hypertensive heart and chronic kidney disease with heart failure and stage 1 through stage 4 chronic kidney disease, or unspecified chronic kidney disease: Secondary | ICD-10-CM | POA: Insufficient documentation

## 2016-03-14 DIAGNOSIS — N184 Chronic kidney disease, stage 4 (severe): Secondary | ICD-10-CM | POA: Insufficient documentation

## 2016-03-14 DIAGNOSIS — Z8673 Personal history of transient ischemic attack (TIA), and cerebral infarction without residual deficits: Secondary | ICD-10-CM | POA: Diagnosis not present

## 2016-03-14 DIAGNOSIS — I5032 Chronic diastolic (congestive) heart failure: Secondary | ICD-10-CM | POA: Insufficient documentation

## 2016-03-14 DIAGNOSIS — J449 Chronic obstructive pulmonary disease, unspecified: Secondary | ICD-10-CM | POA: Insufficient documentation

## 2016-03-14 DIAGNOSIS — Z7901 Long term (current) use of anticoagulants: Secondary | ICD-10-CM | POA: Diagnosis not present

## 2016-03-14 DIAGNOSIS — I252 Old myocardial infarction: Secondary | ICD-10-CM | POA: Diagnosis not present

## 2016-03-14 DIAGNOSIS — I2510 Atherosclerotic heart disease of native coronary artery without angina pectoris: Secondary | ICD-10-CM | POA: Insufficient documentation

## 2016-03-14 DIAGNOSIS — T83098A Other mechanical complication of other indwelling urethral catheter, initial encounter: Secondary | ICD-10-CM | POA: Diagnosis not present

## 2016-03-14 DIAGNOSIS — Z955 Presence of coronary angioplasty implant and graft: Secondary | ICD-10-CM | POA: Diagnosis not present

## 2016-03-14 DIAGNOSIS — E1122 Type 2 diabetes mellitus with diabetic chronic kidney disease: Secondary | ICD-10-CM | POA: Diagnosis not present

## 2016-03-14 DIAGNOSIS — Z79899 Other long term (current) drug therapy: Secondary | ICD-10-CM | POA: Diagnosis not present

## 2016-03-14 NOTE — ED Notes (Signed)
Bed: WA17 Expected date: 03/14/16 Expected time: 10:05 AM Means of arrival: Ambulance Comments: abd pain, foley changed yesterday, not draining

## 2016-03-14 NOTE — ED Notes (Signed)
Bed: WA16 Expected date:  Expected time:  Means of arrival:  Comments: 

## 2016-03-14 NOTE — Telephone Encounter (Signed)
   Reason for call:   I received a call from Ms. Cathern Tahir Leija's daughter [name] at 8:25 AM indicating that her mother is having urinary retention after her chronic foley catheter was exchanged yesterday (03/14/15).   Pertinent Data:   Patient with chronic foley catheter use secondary to chronic urinary retention.  Patient has [facility] home health nursing assist with foley care.  Foley was exchanged yesterday.  Patient has not had UOP since exchange and is now having suprapubic discomfort.   Assessment / Plan / Recommendations:   Patient with urinary retention despite foley catheter placement. I am concerned her foley is not correctly placed. She is advised to ask her home health nursing to visit today for foley exchange. If this is not possible on the weekend, I have advised that she be seen in the ED to assess catheter placement and for UOP. Daughter understands and agrees with plan.  As always, pt is advised that if symptoms worsen or new symptoms arise, they should go to an urgent care facility or to to ER for further evaluation.   Zada Finders, MD   03/14/2016, 5:38 PM

## 2016-03-14 NOTE — ED Triage Notes (Signed)
She is a patient who has a chronic foley catheter. Yesterday evening, her home-health nurse removed and re-inserted her #14 french foley catheter as it was the routine time in which it is normally done (monthly). She finds her foley to be "not draining any urine at all" today, plus she is feeling some lower abd. "pressure". She is in no distress.

## 2016-03-14 NOTE — ED Provider Notes (Signed)
[facility] DEPT Provider Note   CSN: [id number] Arrival date & time: 03/14/16  0917     History   Chief Complaint Chief Complaint  Patient presents with  . Urinary Retention    HPI Michelle Holmes is a 66 y.o. female.  Patient is 66 year old female with multiple medical problems including chronic kidney disease, spastic hemiparesis, obesity, CVA, chronic urinary retention requiring Foley catheter presenting today with Foley catheter not draining. Home health came and changed her catheter yesterday because it was time for a new one. Since that time she's had no drainage. She is now having significant discomfort in her lower abdomen and feels like she needs to urinate. They said when home health came she had significant difficulty placing the catheter. She denies any nausea, vomiting, fever or other symptoms. She was otherwise in her normal state of health yesterday when they came to change the catheter.   The history is provided by the patient.    Past Medical History:  Diagnosis Date  . Anemia   . CHF (congestive heart failure) ([place])   . Chronic back pain   . COPD (chronic obstructive pulmonary disease) ([place])   . Coronary artery disease   . Diabetes mellitus   . DJD (degenerative joint disease)   . Emphysema   . Hypercholesteremia   . Hypertension   . Myocardial infarction   . Obesity   . Obesity hypoventilation syndrome ([place])   . Renal insufficiency   . Scoliosis   . Sleep apnea   . Stroke ([place])   . TIA (transient ischemic attack)     Patient Active Problem List   Diagnosis Date Noted  . Opioid dependence ([place]) 03/05/2016  . Essential hypertension 03/05/2016  . Dvt femoral (deep venous thrombosis) ([place]) 02/17/2016  . Chronic diastolic CHF (congestive heart failure) ([place]) 01/28/2016  . Hypotension 01/28/2016  . Dehydration 01/28/2016  . Protein-calorie malnutrition, severe 01/28/2016  . Emesis   . Goals of care, counseling/discussion   . Palliative care by  specialist   . Anemia in other chronic diseases classified elsewhere   . Acute urinary retention   . Acute kidney injury superimposed on chronic kidney disease ([place])   . Pressure injury of skin 11/24/2015  . Acute metabolic encephalopathy   . Chronic kidney disease (CKD), stage IV (severe) ([place]) 07/06/2015  . Acute CVA (cerebrovascular accident) ([place])   . Acute encephalopathy 07/04/2015  . Sinus bradycardia 07/04/2015  . Parietal lobe infarction ([place]) 07/04/2015  . Spastic hemiplegia affecting nondominant side ([place]) 07/01/2015  . Obesity, morbid ([place]) 07/01/2015  . Thrombocytopenia ([place])   . Acute ischemic VBA thalamic stroke ([place]) 01/18/2015  . Acute kidney injury superimposed on CKD ([place])   . Dysarthria   . Lethargy   . Labile blood pressure   . Hyperlipidemia   . History of CVA with residual deficit   . Chronic obstructive pulmonary disease ([place])   . Hemiparesis, aphasia, and dysphagia as late effect of cerebrovascular accident (CVA) ([place])   . Acute ischemic stroke ([place])   . Right hemiplegia ([place])   . Right sided weakness 01/13/2015  . Abdominal pain 06/20/2014  . Altered mental status 02/08/2012  . OSA on CPAP 12/08/2010  . Morbid obesity ([place]) 12/08/2010  . CAD (coronary artery disease) 12/08/2010  . Uncontrolled type 2 DM with hyperosmolar nonketotic hyperglycemia ([place]) 12/01/2010    Past Surgical History:  Procedure Laterality Date  . COLON SURGERY    . CORONARY ANGIOPLASTY WITH STENT PLACEMENT  OB History    No data available       Home Medications    Prior to Admission medications   Medication Sig Start Date End Date Taking? Authorizing Provider  amLODipine (NORVASC) 5 MG tablet Take 1 tablet (5 mg total) by mouth at bedtime. 03/05/16   Florinda Marker, MD  atorvastatin (LIPITOR) 80 MG tablet Take 1 tablet (80 mg total) by mouth daily at 6 PM. Patient taking differently: Take 40 mg by mouth daily at 6 PM.  07/07/15   Mauricio Gerome Apley, MD  enoxaparin  (LOVENOX) 120 MG/0.8ML injection Inject 0.8 mLs (120 mg total) into the skin daily. 03/13/16   Bartholomew Crews, MD  LYRICA 50 MG capsule Take 1 capsule (50 mg total) by mouth daily. Patient taking differently: Take 50 mg by mouth 2 (two) times daily.  07/14/15   Silver Huguenin Elgergawy, MD  Oxycodone HCl 20 MG TABS Take 1 tablet (20 mg total) by mouth 2 (two) times daily as needed. 03/05/16   Florinda Marker, MD  Oxycodone HCl 20 MG TABS Take 1 tablet (20 mg total) by mouth 2 (two) times daily as needed. 03/05/16   Florinda Marker, MD  Oxycodone HCl 20 MG TABS Take 1 tablet (20 mg total) by mouth 2 (two) times daily as needed. 03/05/16   Alexa Angela Burke, MD  polyethylene glycol powder (MIRALAX) powder Take 17 g by mouth daily. 01/15/16   Jola Schmidt, MD  sodium bicarbonate 650 MG tablet Take 1 tablet (650 mg total) by mouth daily. 11/29/15   Florencia Reasons, MD  warfarin (COUMADIN) 5 MG tablet Take 2 tablets (10 mg total) by mouth daily. Adjust dose as instructed 03/13/16   Bartholomew Crews, MD    Family History Family History  Problem Relation Age of Onset  . Diabetes Mother   . Stroke Maternal Aunt     Social History Social History  Substance Use Topics  . Smoking status: Never Smoker  . Smokeless tobacco: Never Used  . Alcohol use No     Allergies   Penicillins   Review of Systems Review of Systems  All other systems reviewed and are negative.    Physical Exam Updated Vital Signs BP 144/92 (BP Location: Right Arm)   Pulse 78   Temp 98.4 F (36.9 C) (Oral)   Resp 18   SpO2 96%   Physical Exam  Constitutional: She is oriented to person, place, and time. She appears well-developed and well-nourished. No distress.  HENT:  Head: Normocephalic and atraumatic.  Eyes: EOM are normal. Pupils are equal, round, and reactive to light.  Cardiovascular: Normal rate, regular rhythm and normal heart sounds.   Pulmonary/Chest: Effort normal and breath sounds normal.  Abdominal: There is tenderness  in the suprapubic area.  Musculoskeletal: Normal range of motion.  Neurological: She is alert and oriented to person, place, and time.  Skin: Skin is warm and dry. Capillary refill takes less than 2 seconds. No erythema.  Psychiatric: She has a normal mood and affect. Her behavior is normal.  Nursing note and vitals reviewed.    ED Treatments / Results  Labs (all labs ordered are listed, but only abnormal results are displayed) Labs Reviewed - No data to display  EKG  EKG Interpretation None       Radiology No results found.  Procedures Procedures (including critical care time)  Medications Ordered in ED Medications - No data to display   Initial Impression / Assessment and  Plan / ED Course  I have reviewed the triage vital signs and the nursing notes.  Pertinent labs & imaging results that were available during my care of the patient were reviewed by me and considered in my medical decision making (see chart for details).    Patient presenting today with symptoms of urinary retention. She had a Foley catheter placed yesterday and has not had drainage since 3 PM. She has a chronic Foley due to urinary retention. Concerned that the Foley catheter was not placed in the appropriate location. Bladder scan today shows greater than 600 mL's of fluid. We will place a new Foley catheter. Patient will be able to go home once we can confirm appropriate placement of catheter. She has no other complaints concerning for UTI, infection or other issues. She has no signs of fluid overload today. Do not feel that patient needs further testing at this time.  10:21 AM Foley placed and 61mL outpt.  Pt feeling much better  Final Clinical Impressions(s) / ED Diagnoses   Final diagnoses:  Urinary retention  Complication of Foley catheter, initial encounter [facility])    New Prescriptions New Prescriptions   No medications on file     Blanchie Dessert, MD 03/14/16 1022

## 2016-03-16 ENCOUNTER — Telehealth (INDEPENDENT_AMBULATORY_CARE_PROVIDER_SITE_OTHER): Payer: Medicare Other | Admitting: Pharmacist

## 2016-03-16 ENCOUNTER — Telehealth: Payer: Self-pay | Admitting: *Deleted

## 2016-03-16 DIAGNOSIS — I509 Heart failure, unspecified: Secondary | ICD-10-CM | POA: Diagnosis not present

## 2016-03-16 DIAGNOSIS — L89302 Pressure ulcer of unspecified buttock, stage 2: Secondary | ICD-10-CM | POA: Diagnosis not present

## 2016-03-16 DIAGNOSIS — L89153 Pressure ulcer of sacral region, stage 3: Secondary | ICD-10-CM | POA: Diagnosis not present

## 2016-03-16 DIAGNOSIS — I959 Hypotension, unspecified: Secondary | ICD-10-CM | POA: Diagnosis not present

## 2016-03-16 DIAGNOSIS — M150 Primary generalized (osteo)arthritis: Secondary | ICD-10-CM | POA: Diagnosis not present

## 2016-03-16 DIAGNOSIS — Z7901 Long term (current) use of anticoagulants: Secondary | ICD-10-CM | POA: Diagnosis not present

## 2016-03-16 DIAGNOSIS — I82412 Acute embolism and thrombosis of left femoral vein: Secondary | ICD-10-CM

## 2016-03-16 LAB — POCT INR: INR: 1.4

## 2016-03-16 NOTE — Progress Notes (Signed)
Anticoagulation Management Michelle Holmes is a 66 y.o. female who is on warfarin therapy. Spoke to patient's daughter, Michelle Holmes, who manage's patient's medication at home, and collaborated with [place], [facility] from [place], who has checked patient's INR today.    Indication: DVT Duration: indefinite Supervising physician: Joni Reining  Anticoagulation Clinic Visit History: Patient does not report signs/symptoms of bleeding or thromboembolism  Anticoagulation Episode Summary    Current INR goal:   2.0-3.0  TTR:   6.1 % (1 wk)  Next INR check:   03/18/2016  INR from last check:     Weekly max dose:     Target end date:     INR check location:     Preferred lab:     Send INR reminders to:      Indications   Dvt femoral (deep venous thrombosis) ([place]) [I82.419]       Comments:          ASSESSMENT Recent Results: The most recent result is correlated with 65 mg per week: Lab Results  Component Value Date   INR 1.4 03/16/2016   INR 1.2 03/13/2016   INR 1.4 03/10/2016   Anticoagulation Dosing: INR as of 03/16/2016 and Previous Dosing Information    INR Dt INR Goal Wkly Tot Sun Mon Tue Wed Thu Fri Sat     2.0-3.0 65 mg 10 mg 7.5 mg 10 mg 10 mg 7.5 mg 10 mg 10 mg    Anticoagulation Dose Instructions as of 03/16/2016      Total Sun Mon Tue Wed Thu Fri Sat   New Dose 67.5 mg 10 mg 10 mg 10 mg 10 mg 7.5 mg 10 mg 10 mg     (5 mg x 2)  (5 mg x 2)  (5 mg x 2)  (5 mg x 2)  (5 mg x 1.5)  (5 mg x 2)  (5 mg x 2)                           INR today: Subtherapeutic  PLAN Weekly dose was increased by 4% to 67.5 mg per week. Dose was only increased slightly due to concerns for potential non-adherence according to [place], [facility]. Michelle Holmes also stated at first that patient had taken 1.5 tablets daily the past 2 days, then changed her answer to state patient took 2 tablets daily the past 2 days. Patient is also taking enoxaparin 1.5 mg/kg daily until INR therapeutic.  Patient Instructions  Patient  educated about medication as defined in this encounter and verbalized understanding by repeating back instructions provided.   Patient advised to contact clinic or seek medical attention if signs/symptoms of bleeding or thromboembolism occur.  Patient verbalized understanding by repeating back information and was advised to contact me if further medication-related questions arise. Patient was also provided an information handout.  Follow-up 2 days  [name]

## 2016-03-16 NOTE — Patient Instructions (Signed)
Patient educated about medication as defined in this encounter and verbalized understanding by repeating back instructions provided.   

## 2016-03-16 NOTE — Telephone Encounter (Addendum)
Call from [facility] at [facility]. PT/INR drawn today.    INR- 1.4    PT  - 17.1  Also he stated he did not do any wound care today b/c pt is sitting in chair and has to stay in chair b/c her hospital bed is being replaced or repaired this afternoon. And pt went to ED this w/e b/c foley not working/urinary retention. See ED noted. Stated he learned foley, when replaced, was not inserted correctly.

## 2016-03-16 NOTE — Telephone Encounter (Signed)
Rx written 2/16, requesting 90 day refill, Appt 03/24/16 with Mannie Stabile.

## 2016-03-18 DIAGNOSIS — G4733 Obstructive sleep apnea (adult) (pediatric): Secondary | ICD-10-CM | POA: Diagnosis not present

## 2016-03-19 NOTE — Progress Notes (Signed)
Internal Medicine Clinic Attending  Case discussed with Dr. Burns soon after the resident saw the patient.  We reviewed the resident's history and exam and pertinent patient test results.  I agree with the assessment, diagnosis, and plan of care documented in the resident's note. 

## 2016-03-20 ENCOUNTER — Telehealth (INDEPENDENT_AMBULATORY_CARE_PROVIDER_SITE_OTHER): Payer: Medicare Other | Admitting: *Deleted

## 2016-03-20 DIAGNOSIS — L89302 Pressure ulcer of unspecified buttock, stage 2: Secondary | ICD-10-CM | POA: Diagnosis not present

## 2016-03-20 DIAGNOSIS — I82412 Acute embolism and thrombosis of left femoral vein: Secondary | ICD-10-CM | POA: Diagnosis not present

## 2016-03-20 DIAGNOSIS — Z7901 Long term (current) use of anticoagulants: Secondary | ICD-10-CM | POA: Diagnosis not present

## 2016-03-20 DIAGNOSIS — I959 Hypotension, unspecified: Secondary | ICD-10-CM | POA: Diagnosis not present

## 2016-03-20 DIAGNOSIS — L89153 Pressure ulcer of sacral region, stage 3: Secondary | ICD-10-CM | POA: Diagnosis not present

## 2016-03-20 LAB — POCT INR: INR: 1.8

## 2016-03-20 NOTE — Telephone Encounter (Signed)
Call from [facility] .   INR is 1.8

## 2016-03-20 NOTE — Patient Instructions (Signed)
Patient educated about medication as defined in this encounter and verbalized understanding by repeating back instructions provided.   

## 2016-03-20 NOTE — Progress Notes (Addendum)
Anticoagulation Management Michelle Holmes is a 66 y.o. female who is on warfarin therapy. Spoke to [place], [facility] for Michelle Holmes, who completed the INR and assessment for patient.  Indication: DVT  Duration: indefinite Supervising physician: [place] Clinic Visit History: Patient does not report signs/symptoms of bleeding or thromboembolism. [name] expresses concern that patient may not be taking warfarin as instructed, however, family repeats back the correct dose and endorses adherence.   Anticoagulation Episode Summary    Current INR goal:   2.0-3.0  TTR:   4.0 % (1.6 wk)  Next INR check:   03/24/2016  INR from last check:   1.8! (03/20/2016)  Weekly max dose:     Target end date:     INR check location:     Preferred lab:     Send INR reminders to:      Indications   Dvt femoral (deep venous thrombosis) (HCC) [I82.419]       Comments:          ASSESSMENT Recent Results: Lab Results  Component Value Date   INR 1.8 03/20/2016   INR 1.4 03/16/2016   INR 1.2 03/13/2016   Anticoagulation Dosing: INR as of 03/20/2016 and Previous Dosing Information    INR Dt INR Goal [employer] Sun Mon Tue Wed Thu Fri Sat   03/20/2016 1.8 2.0-3.0 67.5 mg 10 mg 10 mg 10 mg 10 mg 7.5 mg 10 mg 10 mg    Anticoagulation Dose Instructions as of 03/20/2016      Total Sun Mon Tue Wed Thu Fri Sat   New Dose 67.5 mg 10 mg 10 mg 10 mg 10 mg 7.5 mg 10 mg 10 mg     (5 mg x 2)  (5 mg x 2)  (5 mg x 2)  (5 mg x 2)  (5 mg x 1.5)  (5 mg x 2)  (5 mg x 2)                           INR today: Subtherapeutic  PLAN Discussed with PCP and decided to continue current warfarin dose, re-check INR 03/24/16, d/c enoxaparin for now. Patient will need close monitoring and office clinic visits scheduled. Will help to coordinate scheduling with PCP. Of note, patient is not an ideal DOAC candidate due to renal impairment.  Patient Instructions  Patient educated about medication as defined in  this encounter and verbalized understanding by repeating back instructions provided.   Patient advised to contact clinic or seek medical attention if signs/symptoms of bleeding or thromboembolism occur.  Patient verbalized understanding by repeating back information and was advised to contact me if further medication-related questions arise. Patient was also provided an information handout.  Follow-up 4 days  Michelle Holmes

## 2016-03-23 ENCOUNTER — Telehealth: Payer: Self-pay | Admitting: Internal Medicine

## 2016-03-23 NOTE — Telephone Encounter (Signed)
APT. REMINDER CALL, LMTCB °

## 2016-03-24 ENCOUNTER — Ambulatory Visit (INDEPENDENT_AMBULATORY_CARE_PROVIDER_SITE_OTHER): Payer: Medicare Other | Admitting: Pharmacist

## 2016-03-24 DIAGNOSIS — I82412 Acute embolism and thrombosis of left femoral vein: Secondary | ICD-10-CM | POA: Diagnosis not present

## 2016-03-24 DIAGNOSIS — J449 Chronic obstructive pulmonary disease, unspecified: Secondary | ICD-10-CM | POA: Diagnosis not present

## 2016-03-24 DIAGNOSIS — Z7901 Long term (current) use of anticoagulants: Secondary | ICD-10-CM | POA: Diagnosis not present

## 2016-03-24 LAB — POCT INR: INR: 1.5

## 2016-03-25 NOTE — Patient Instructions (Signed)
Patient educated about medication as defined in this encounter and verbalized understanding by repeating back instructions provided.   

## 2016-03-25 NOTE — Progress Notes (Signed)
Anticoagulation Management Michelle Holmes is a 66 y.o. female who reports to the clinic for monitoring of warfarin treatment. Patient is accompanied by her daughter, [name], who assists in patient's care.  Indication: DVT Duration: indefinite Supervising physician: [facility] Visit History: Patient does not report signs/symptoms of bleeding or thromboembolism. Patient and daughter both endorse medication adherence and correctly recite taking 2 tablets (10 mg) daily as instructed.  Anticoagulation Episode Summary    Current INR goal:   2.0-3.0  TTR:   2.8 % (2.3 wk)  Next INR check:   03/26/2016  INR from last check:     Most recent INR:    1.5! (03/25/2016)  Weekly max dose:     Target end date:     INR check location:     Preferred lab:     Send INR reminders to:      Indications   Dvt femoral (deep venous thrombosis) (HCC) [I82.419]       Comments:          ASSESSMENT Recent Results: Lab Results  Component Value Date   INR 1.5 03/25/2016   INR 1.8 03/20/2016   INR 1.4 03/16/2016   Anticoagulation Dosing: INR as of 03/24/2016 and Previous Dosing Information    INR Dt INR Goal Wkly Tot Sun Mon Tue Wed Thu Fri Sat     2.0-3.0 67.5 mg 10 mg 10 mg 10 mg 10 mg 7.5 mg 10 mg 10 mg    Anticoagulation Dose Instructions as of 03/24/2016      Total Sun Mon Tue Wed Thu Fri Sat   New Dose 72.5 mg 10 mg 10 mg 12.5 mg 12.5 mg 7.5 mg 10 mg 10 mg     (5 mg x 2)  (5 mg x 2)  (5 mg x 2.5)  (5 mg x 2.5)  (5 mg x 1.5)  (5 mg x 2)  (5 mg x 2)                           INR today: Subtherapeutic  PLAN Increase to 2.5 tablets (12.5 mg) daily and re-check INR in 2 days with home health nurse. Provided adherence education.  Patient Instructions  Patient educated about medication as defined in this encounter and verbalized understanding by repeating back instructions provided.   Patient advised to contact clinic or seek medical attention if  signs/symptoms of bleeding or thromboembolism occur.  Patient verbalized understanding by repeating back information and was advised to contact me if further medication-related questions arise. Patient was also provided an information handout.  Follow-up 03/26/16  [name]

## 2016-03-26 ENCOUNTER — Encounter (HOSPITAL_BASED_OUTPATIENT_CLINIC_OR_DEPARTMENT_OTHER): Payer: Medicare Other | Attending: Internal Medicine

## 2016-03-26 DIAGNOSIS — G4730 Sleep apnea, unspecified: Secondary | ICD-10-CM | POA: Diagnosis not present

## 2016-03-26 DIAGNOSIS — I2510 Atherosclerotic heart disease of native coronary artery without angina pectoris: Secondary | ICD-10-CM | POA: Insufficient documentation

## 2016-03-26 DIAGNOSIS — Z9221 Personal history of antineoplastic chemotherapy: Secondary | ICD-10-CM | POA: Insufficient documentation

## 2016-03-26 DIAGNOSIS — I10 Essential (primary) hypertension: Secondary | ICD-10-CM | POA: Diagnosis not present

## 2016-03-26 DIAGNOSIS — L89153 Pressure ulcer of sacral region, stage 3: Secondary | ICD-10-CM | POA: Insufficient documentation

## 2016-03-26 DIAGNOSIS — Z923 Personal history of irradiation: Secondary | ICD-10-CM | POA: Insufficient documentation

## 2016-03-26 DIAGNOSIS — Z86718 Personal history of other venous thrombosis and embolism: Secondary | ICD-10-CM | POA: Diagnosis not present

## 2016-03-26 DIAGNOSIS — J449 Chronic obstructive pulmonary disease, unspecified: Secondary | ICD-10-CM | POA: Insufficient documentation

## 2016-03-26 DIAGNOSIS — I252 Old myocardial infarction: Secondary | ICD-10-CM | POA: Diagnosis not present

## 2016-03-26 DIAGNOSIS — L97421 Non-pressure chronic ulcer of left heel and midfoot limited to breakdown of skin: Secondary | ICD-10-CM | POA: Diagnosis not present

## 2016-03-27 ENCOUNTER — Telehealth: Payer: Self-pay | Admitting: Pharmacist

## 2016-03-27 DIAGNOSIS — L89153 Pressure ulcer of sacral region, stage 3: Secondary | ICD-10-CM | POA: Diagnosis not present

## 2016-03-27 DIAGNOSIS — L89302 Pressure ulcer of unspecified buttock, stage 2: Secondary | ICD-10-CM | POA: Diagnosis not present

## 2016-03-27 DIAGNOSIS — I959 Hypotension, unspecified: Secondary | ICD-10-CM | POA: Diagnosis not present

## 2016-03-27 DIAGNOSIS — Z7901 Long term (current) use of anticoagulants: Secondary | ICD-10-CM | POA: Diagnosis not present

## 2016-03-27 LAB — POCT INR: INR: 1.5

## 2016-03-27 NOTE — Telephone Encounter (Signed)
Patient's INR was low again today at 1.5. According to home health, patient has used less than 10 of the warfarin tablets that she picked up on 2/16. Contacted her to come back into the clinic next week. Will tentatively schedule her for Wednesday 3/7.   Ihor Austin, PharmD

## 2016-03-30 ENCOUNTER — Telehealth: Payer: Self-pay | Admitting: Pharmacist

## 2016-03-30 DIAGNOSIS — S91309A Unspecified open wound, unspecified foot, initial encounter: Secondary | ICD-10-CM | POA: Diagnosis not present

## 2016-03-30 DIAGNOSIS — L89153 Pressure ulcer of sacral region, stage 3: Secondary | ICD-10-CM | POA: Diagnosis not present

## 2016-03-30 NOTE — Telephone Encounter (Signed)
Spoke with patient's daughter [name]. Daughter is not able to get patient in an out of car at this juncture and is trying to work on transportation. Daughter will call if something is arranged. Will speak with Dr. Maudie Mercury about possible options for this patient.

## 2016-04-01 ENCOUNTER — Ambulatory Visit: Payer: Self-pay | Admitting: Pharmacist

## 2016-04-01 DIAGNOSIS — I959 Hypotension, unspecified: Secondary | ICD-10-CM | POA: Diagnosis not present

## 2016-04-01 DIAGNOSIS — L89302 Pressure ulcer of unspecified buttock, stage 2: Secondary | ICD-10-CM | POA: Diagnosis not present

## 2016-04-01 DIAGNOSIS — Z7901 Long term (current) use of anticoagulants: Secondary | ICD-10-CM | POA: Diagnosis not present

## 2016-04-01 DIAGNOSIS — L89153 Pressure ulcer of sacral region, stage 3: Secondary | ICD-10-CM | POA: Diagnosis not present

## 2016-04-02 ENCOUNTER — Telehealth (INDEPENDENT_AMBULATORY_CARE_PROVIDER_SITE_OTHER): Payer: Medicare Other | Admitting: Pharmacist

## 2016-04-02 ENCOUNTER — Telehealth: Payer: Self-pay | Admitting: Pharmacist

## 2016-04-02 DIAGNOSIS — Z7901 Long term (current) use of anticoagulants: Secondary | ICD-10-CM | POA: Diagnosis not present

## 2016-04-02 DIAGNOSIS — I82412 Acute embolism and thrombosis of left femoral vein: Secondary | ICD-10-CM | POA: Diagnosis not present

## 2016-04-02 DIAGNOSIS — N184 Chronic kidney disease, stage 4 (severe): Secondary | ICD-10-CM

## 2016-04-02 LAB — POCT INR: INR: 1.3

## 2016-04-02 NOTE — Telephone Encounter (Signed)
Anti-Coagulation Progress Note  Michelle Holmes is a 66 y.o. female who is currently on an anti-coagulation regimen.    RECENT RESULTS: Recent results are below, the most recent result is correlated with a dose of  72.5 mg. per week: Lab Results  Component Value Date   INR 1.3 04/01/2016   INR 1.5 03/27/2016   INR 1.5 03/24/2016    ANTI-COAG DOSE: Anticoagulation Dose Instructions as of 04/02/2016      [name] Tue Wed Thu Fri Sat   New Dose 10 mg 10 mg 12.5 mg 12.5 mg 7.5 mg 10 mg 10 mg    Description   Patient has been non-compliant with medications.       [name] has officially signed off as of 3/7 due to patient non-compliance. Patient had only taken 2 tablets in the past week. Daughter states that patient is unable to come in to clinic due to daughter not being able to get her out of the car. Will discuss potential alternative anticoagulation options with Dr. [name].    [name], PharmD PGY1 Pharmacy Resident Pager: [phone]

## 2016-04-02 NOTE — Telephone Encounter (Signed)
Per Home Health, INR on 3/2 is 1.5. Also according to [facility], patient has not been taking her warfarin every day as only a few tablets are missing from her prescription that was picked up on 2/16. Instructed daughter to continue same dose, reinforced importance of compliance and asked patient to come to clinic next week.

## 2016-04-02 NOTE — Telephone Encounter (Signed)
She is on Coumadin because of her stage IV CKD. She has multiple visits with documented noncompliance stating that she does not like to swallow pills. I don't know what to offer her more.

## 2016-04-02 NOTE — Patient Instructions (Signed)
Keep taking the same dose of your medication. Make sure you take your medication every day. Home health will follow-up on 3/7.

## 2016-04-03 ENCOUNTER — Telehealth: Payer: Self-pay | Admitting: Pharmacist

## 2016-04-03 DIAGNOSIS — S31809A Unspecified open wound of unspecified buttock, initial encounter: Secondary | ICD-10-CM | POA: Diagnosis not present

## 2016-04-03 DIAGNOSIS — I82412 Acute embolism and thrombosis of left femoral vein: Secondary | ICD-10-CM

## 2016-04-03 MED ORDER — RIVAROXABAN 10 MG PO TABS: 10.0000 mg | ORAL_TABLET | Freq: Every day | ORAL | 3 refills | Status: DC

## 2016-04-03 NOTE — Telephone Encounter (Signed)
Patient was switched from warfarin to rivaroxaban as discussed with PCP and family

## 2016-04-08 DIAGNOSIS — N189 Chronic kidney disease, unspecified: Secondary | ICD-10-CM | POA: Diagnosis not present

## 2016-04-08 DIAGNOSIS — I2510 Atherosclerotic heart disease of native coronary artery without angina pectoris: Secondary | ICD-10-CM | POA: Diagnosis not present

## 2016-04-08 DIAGNOSIS — I252 Old myocardial infarction: Secondary | ICD-10-CM | POA: Diagnosis not present

## 2016-04-08 DIAGNOSIS — I639 Cerebral infarction, unspecified: Secondary | ICD-10-CM | POA: Diagnosis not present

## 2016-04-08 DIAGNOSIS — I1310 Hypertensive heart and chronic kidney disease without heart failure, with stage 1 through stage 4 chronic kidney disease, or unspecified chronic kidney disease: Secondary | ICD-10-CM | POA: Diagnosis not present

## 2016-04-09 DIAGNOSIS — Z86718 Personal history of other venous thrombosis and embolism: Secondary | ICD-10-CM | POA: Diagnosis not present

## 2016-04-09 DIAGNOSIS — I252 Old myocardial infarction: Secondary | ICD-10-CM | POA: Diagnosis not present

## 2016-04-09 DIAGNOSIS — Z923 Personal history of irradiation: Secondary | ICD-10-CM | POA: Diagnosis not present

## 2016-04-09 DIAGNOSIS — G4730 Sleep apnea, unspecified: Secondary | ICD-10-CM | POA: Diagnosis not present

## 2016-04-09 DIAGNOSIS — I2510 Atherosclerotic heart disease of native coronary artery without angina pectoris: Secondary | ICD-10-CM | POA: Diagnosis not present

## 2016-04-09 DIAGNOSIS — I10 Essential (primary) hypertension: Secondary | ICD-10-CM | POA: Diagnosis not present

## 2016-04-09 DIAGNOSIS — L89153 Pressure ulcer of sacral region, stage 3: Secondary | ICD-10-CM | POA: Diagnosis not present

## 2016-04-09 DIAGNOSIS — Z9221 Personal history of antineoplastic chemotherapy: Secondary | ICD-10-CM | POA: Diagnosis not present

## 2016-04-09 DIAGNOSIS — J449 Chronic obstructive pulmonary disease, unspecified: Secondary | ICD-10-CM | POA: Diagnosis not present

## 2016-04-13 DIAGNOSIS — I639 Cerebral infarction, unspecified: Secondary | ICD-10-CM | POA: Diagnosis not present

## 2016-04-13 DIAGNOSIS — I5040 Unspecified combined systolic (congestive) and diastolic (congestive) heart failure: Secondary | ICD-10-CM | POA: Diagnosis not present

## 2016-04-14 DIAGNOSIS — L89159 Pressure ulcer of sacral region, unspecified stage: Secondary | ICD-10-CM | POA: Diagnosis not present

## 2016-04-15 DIAGNOSIS — G4733 Obstructive sleep apnea (adult) (pediatric): Secondary | ICD-10-CM | POA: Diagnosis not present

## 2016-04-16 DIAGNOSIS — I10 Essential (primary) hypertension: Secondary | ICD-10-CM | POA: Diagnosis not present

## 2016-04-16 DIAGNOSIS — L89153 Pressure ulcer of sacral region, stage 3: Secondary | ICD-10-CM | POA: Diagnosis not present

## 2016-04-16 DIAGNOSIS — I2510 Atherosclerotic heart disease of native coronary artery without angina pectoris: Secondary | ICD-10-CM | POA: Diagnosis not present

## 2016-04-16 DIAGNOSIS — Z86718 Personal history of other venous thrombosis and embolism: Secondary | ICD-10-CM | POA: Diagnosis not present

## 2016-04-16 DIAGNOSIS — I252 Old myocardial infarction: Secondary | ICD-10-CM | POA: Diagnosis not present

## 2016-04-16 DIAGNOSIS — J449 Chronic obstructive pulmonary disease, unspecified: Secondary | ICD-10-CM | POA: Diagnosis not present

## 2016-04-16 DIAGNOSIS — Z923 Personal history of irradiation: Secondary | ICD-10-CM | POA: Diagnosis not present

## 2016-04-16 DIAGNOSIS — G4730 Sleep apnea, unspecified: Secondary | ICD-10-CM | POA: Diagnosis not present

## 2016-04-16 DIAGNOSIS — Z9221 Personal history of antineoplastic chemotherapy: Secondary | ICD-10-CM | POA: Diagnosis not present

## 2016-04-21 DIAGNOSIS — J449 Chronic obstructive pulmonary disease, unspecified: Secondary | ICD-10-CM | POA: Diagnosis not present

## 2016-04-27 ENCOUNTER — Ambulatory Visit: Payer: Medicare Other | Admitting: Physical Medicine & Rehabilitation

## 2016-04-27 ENCOUNTER — Encounter: Payer: Medicare Other | Attending: Physical Medicine & Rehabilitation

## 2016-04-27 DIAGNOSIS — G8114 Spastic hemiplegia affecting left nondominant side: Secondary | ICD-10-CM | POA: Insufficient documentation

## 2016-04-30 ENCOUNTER — Encounter (HOSPITAL_BASED_OUTPATIENT_CLINIC_OR_DEPARTMENT_OTHER): Payer: Medicare Other | Attending: Internal Medicine

## 2016-04-30 DIAGNOSIS — E119 Type 2 diabetes mellitus without complications: Secondary | ICD-10-CM | POA: Insufficient documentation

## 2016-04-30 DIAGNOSIS — I252 Old myocardial infarction: Secondary | ICD-10-CM | POA: Insufficient documentation

## 2016-04-30 DIAGNOSIS — G4730 Sleep apnea, unspecified: Secondary | ICD-10-CM | POA: Insufficient documentation

## 2016-04-30 DIAGNOSIS — Z9221 Personal history of antineoplastic chemotherapy: Secondary | ICD-10-CM | POA: Diagnosis not present

## 2016-04-30 DIAGNOSIS — Z923 Personal history of irradiation: Secondary | ICD-10-CM | POA: Insufficient documentation

## 2016-04-30 DIAGNOSIS — I10 Essential (primary) hypertension: Secondary | ICD-10-CM | POA: Insufficient documentation

## 2016-04-30 DIAGNOSIS — I2510 Atherosclerotic heart disease of native coronary artery without angina pectoris: Secondary | ICD-10-CM | POA: Insufficient documentation

## 2016-04-30 DIAGNOSIS — S91309A Unspecified open wound, unspecified foot, initial encounter: Secondary | ICD-10-CM | POA: Diagnosis not present

## 2016-04-30 DIAGNOSIS — L89153 Pressure ulcer of sacral region, stage 3: Secondary | ICD-10-CM | POA: Insufficient documentation

## 2016-04-30 DIAGNOSIS — J449 Chronic obstructive pulmonary disease, unspecified: Secondary | ICD-10-CM | POA: Diagnosis not present

## 2016-04-30 DIAGNOSIS — Z86718 Personal history of other venous thrombosis and embolism: Secondary | ICD-10-CM | POA: Insufficient documentation

## 2016-05-01 ENCOUNTER — Ambulatory Visit: Payer: Medicare Other | Admitting: Physical Medicine & Rehabilitation

## 2016-05-04 ENCOUNTER — Ambulatory Visit: Payer: Self-pay | Admitting: Physical Medicine & Rehabilitation

## 2016-05-11 ENCOUNTER — Ambulatory Visit (HOSPITAL_BASED_OUTPATIENT_CLINIC_OR_DEPARTMENT_OTHER): Payer: Medicare Other | Admitting: Physical Medicine & Rehabilitation

## 2016-05-11 ENCOUNTER — Encounter: Payer: Self-pay | Admitting: Physical Medicine & Rehabilitation

## 2016-05-11 ENCOUNTER — Ambulatory Visit: Payer: Self-pay | Admitting: Physical Medicine & Rehabilitation

## 2016-05-11 ENCOUNTER — Ambulatory Visit: Payer: Medicare Other

## 2016-05-11 VITALS — BP 166/104 | HR 84 | Resp 14

## 2016-05-11 DIAGNOSIS — G8114 Spastic hemiplegia affecting left nondominant side: Secondary | ICD-10-CM | POA: Diagnosis not present

## 2016-05-11 DIAGNOSIS — G8110 Spastic hemiplegia affecting unspecified side: Secondary | ICD-10-CM

## 2016-05-11 NOTE — Patient Instructions (Signed)

## 2016-05-11 NOTE — Progress Notes (Signed)
Botox Injection for spasticity using needle EMG guidance  Dilution: 50 Units/ml Indication: Severe spasticity which interferes with ADL,mobility and/or  hygiene and is unresponsive to medication management and other conservative care Informed consent was obtained after describing risks and benefits of the procedure with the patient. This includes bleeding, bruising, infection, excessive weakness, or medication side effects. A REMS form is on file and signed. Needle: 25g 2" needle electrode Number of units per muscle  Biceps25 FCR50 FCU25 FDS50 FDP50  All injections were done after obtaining appropriate EMG activity and after negative drawback for blood. The patient tolerated the procedure well. Post procedure instructions were given. A followup appointment was made.

## 2016-05-14 DIAGNOSIS — Z9221 Personal history of antineoplastic chemotherapy: Secondary | ICD-10-CM | POA: Diagnosis not present

## 2016-05-14 DIAGNOSIS — I2510 Atherosclerotic heart disease of native coronary artery without angina pectoris: Secondary | ICD-10-CM | POA: Diagnosis not present

## 2016-05-14 DIAGNOSIS — I10 Essential (primary) hypertension: Secondary | ICD-10-CM | POA: Diagnosis not present

## 2016-05-14 DIAGNOSIS — J449 Chronic obstructive pulmonary disease, unspecified: Secondary | ICD-10-CM | POA: Diagnosis not present

## 2016-05-14 DIAGNOSIS — L89153 Pressure ulcer of sacral region, stage 3: Secondary | ICD-10-CM | POA: Diagnosis not present

## 2016-05-14 DIAGNOSIS — I5040 Unspecified combined systolic (congestive) and diastolic (congestive) heart failure: Secondary | ICD-10-CM | POA: Diagnosis not present

## 2016-05-14 DIAGNOSIS — Z923 Personal history of irradiation: Secondary | ICD-10-CM | POA: Diagnosis not present

## 2016-05-14 DIAGNOSIS — I252 Old myocardial infarction: Secondary | ICD-10-CM | POA: Diagnosis not present

## 2016-05-14 DIAGNOSIS — E119 Type 2 diabetes mellitus without complications: Secondary | ICD-10-CM | POA: Diagnosis not present

## 2016-05-14 DIAGNOSIS — G4730 Sleep apnea, unspecified: Secondary | ICD-10-CM | POA: Diagnosis not present

## 2016-05-14 DIAGNOSIS — Z86718 Personal history of other venous thrombosis and embolism: Secondary | ICD-10-CM | POA: Diagnosis not present

## 2016-05-14 DIAGNOSIS — I639 Cerebral infarction, unspecified: Secondary | ICD-10-CM | POA: Diagnosis not present

## 2016-05-16 DIAGNOSIS — G4733 Obstructive sleep apnea (adult) (pediatric): Secondary | ICD-10-CM | POA: Diagnosis not present

## 2016-05-18 DIAGNOSIS — L89159 Pressure ulcer of sacral region, unspecified stage: Secondary | ICD-10-CM | POA: Diagnosis not present

## 2016-05-22 DIAGNOSIS — J449 Chronic obstructive pulmonary disease, unspecified: Secondary | ICD-10-CM | POA: Diagnosis not present

## 2016-05-28 ENCOUNTER — Encounter (HOSPITAL_BASED_OUTPATIENT_CLINIC_OR_DEPARTMENT_OTHER): Payer: Medicare Other | Attending: Internal Medicine

## 2016-05-28 DIAGNOSIS — I252 Old myocardial infarction: Secondary | ICD-10-CM | POA: Diagnosis not present

## 2016-05-28 DIAGNOSIS — L89159 Pressure ulcer of sacral region, unspecified stage: Secondary | ICD-10-CM | POA: Diagnosis not present

## 2016-05-28 DIAGNOSIS — Z09 Encounter for follow-up examination after completed treatment for conditions other than malignant neoplasm: Secondary | ICD-10-CM | POA: Diagnosis not present

## 2016-05-28 DIAGNOSIS — Z86718 Personal history of other venous thrombosis and embolism: Secondary | ICD-10-CM | POA: Diagnosis not present

## 2016-05-28 DIAGNOSIS — J449 Chronic obstructive pulmonary disease, unspecified: Secondary | ICD-10-CM | POA: Insufficient documentation

## 2016-05-28 DIAGNOSIS — I2510 Atherosclerotic heart disease of native coronary artery without angina pectoris: Secondary | ICD-10-CM | POA: Insufficient documentation

## 2016-05-28 DIAGNOSIS — Z872 Personal history of diseases of the skin and subcutaneous tissue: Secondary | ICD-10-CM | POA: Diagnosis not present

## 2016-05-28 DIAGNOSIS — Z923 Personal history of irradiation: Secondary | ICD-10-CM | POA: Diagnosis not present

## 2016-05-28 DIAGNOSIS — G4730 Sleep apnea, unspecified: Secondary | ICD-10-CM | POA: Insufficient documentation

## 2016-05-28 DIAGNOSIS — I10 Essential (primary) hypertension: Secondary | ICD-10-CM | POA: Insufficient documentation

## 2016-05-28 DIAGNOSIS — Z9221 Personal history of antineoplastic chemotherapy: Secondary | ICD-10-CM | POA: Diagnosis not present

## 2016-05-30 DIAGNOSIS — S91309A Unspecified open wound, unspecified foot, initial encounter: Secondary | ICD-10-CM | POA: Diagnosis not present

## 2016-05-30 DIAGNOSIS — L89153 Pressure ulcer of sacral region, stage 3: Secondary | ICD-10-CM | POA: Diagnosis not present

## 2016-06-03 ENCOUNTER — Ambulatory Visit (INDEPENDENT_AMBULATORY_CARE_PROVIDER_SITE_OTHER): Payer: Medicare Other | Admitting: Internal Medicine

## 2016-06-03 VITALS — BP 157/78 | HR 81 | Temp 98.4°F

## 2016-06-03 DIAGNOSIS — Z79891 Long term (current) use of opiate analgesic: Secondary | ICD-10-CM | POA: Insufficient documentation

## 2016-06-03 DIAGNOSIS — I69351 Hemiplegia and hemiparesis following cerebral infarction affecting right dominant side: Secondary | ICD-10-CM

## 2016-06-03 DIAGNOSIS — M79606 Pain in leg, unspecified: Secondary | ICD-10-CM | POA: Diagnosis not present

## 2016-06-03 DIAGNOSIS — G8191 Hemiplegia, unspecified affecting right dominant side: Secondary | ICD-10-CM

## 2016-06-03 DIAGNOSIS — G8929 Other chronic pain: Secondary | ICD-10-CM | POA: Diagnosis not present

## 2016-06-03 DIAGNOSIS — M545 Low back pain: Secondary | ICD-10-CM

## 2016-06-03 MED ORDER — OXYCODONE HCL 20 MG PO TABS: 20.0000 mg | ORAL_TABLET | Freq: Three times a day (TID) | ORAL | 0 refills | Status: AC

## 2016-06-03 MED ORDER — DIAPERS & SUPPLIES MISC: 12 refills | Status: AC

## 2016-06-03 NOTE — Progress Notes (Signed)
   CC: Chronic Pain HPI: [name] is a 66 y.o. 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  Review of Systems: Denies Chest pain and SOB. Denies n/v and abdominal pain. Denies constipation.   Physical Exam: Vitals:   06/03/16 0956  BP: (!) 157/78  Pulse: 81   General appearance: alert, cooperative and Resting comfortably in a wheelchair Head: Normocephalic, without obvious abnormality, atraumatic Lungs: clear to auscultation bilaterally Heart: regular rate and rhythm, S1, S2 normal, no murmur, click, rub or gallop Abdomen: soft, non-tender; bowel sounds normal; no masses,  no organomegaly  Assessment & Plan:  See encounters tab for problem based medical decision making. Patient discussed with Dr. Angelia Mould  Signed: Ophelia Shoulder, MD 06/03/2016, 9:57 AM  Pager: [phone]

## 2016-06-03 NOTE — Addendum Note (Signed)
Addended by: Deirdre Evener on: 06/03/2016 12:56 PM   Modules accepted: Orders

## 2016-06-03 NOTE — Assessment & Plan Note (Signed)
The patient has a history of right hemiplegia secondary to CVA. She requires several supplies including diapers and pads for the treatment of chronic comorbidities secondary to ischemic lesion stroke. -- Prescription for supplies

## 2016-06-03 NOTE — Addendum Note (Signed)
Addended by: Marcelino Duster on: 06/03/2016 11:36 AM   Modules accepted: Orders

## 2016-06-03 NOTE — Assessment & Plan Note (Addendum)
The patient is receiving long-term opioid analgesic for chronic low back and lower extremity pain. She presents today for refill and to request an increase in dose. For further details of the patient's use please see the note by Dr. Quay Burow. The patient states that her pain is extremely severe at night causing her to cry and is 10 out of 10. She is requesting that we increase her therapy to 3 times daily. Today, I will provide an increase in her pain management of oxycodone 20 mg 3 times daily for one month. She is to return to clinic in 1 month to ensure she has not developed side effects such as constipation and to ensure her pain is adequately treated. She will need a UDS at that time and prescriptions can be provided for a longer period if there are no red flags and the patient is tolerating the medication appropriately. I had a long discussion with the patient again explaining the risks and benefits of long-term opioid use. She endorsed understanding of these risks. She states that she needed them to help her quality of life as her pain was too severe without the medication. I discussed safe handling techniques and told her she should keep these in a secure locked place that only she is aware of. She endorsed understanding of this. -- Oxycodone 20 mg 3 times a day -- Return to clinic in 1 month for symptom management and evaluation -- UDS in 1 month -- Review [place] prescribing controlled substance database at next visit

## 2016-06-03 NOTE — Assessment & Plan Note (Signed)
Patient is on long-term opioid therapy for chronic pain as result of CVA. For additional details please see under assessment and plan "long-term current use of opioid analgesic".

## 2016-06-03 NOTE — Patient Instructions (Signed)
It was a pleasure seeing you today. Thank you for choosing [facility] for your healthcare needs.   Please take your medication as prescribed. Please keep your pill bottle locked up in a safe and secure location.  Please return to clinic in 1 month for follow-up.

## 2016-06-04 ENCOUNTER — Encounter: Payer: Self-pay | Admitting: *Deleted

## 2016-06-09 NOTE — Progress Notes (Signed)
Internal Medicine Clinic Attending  Case discussed with Dr. Lovena Le at the time of the visit.  We reviewed the resident's history and exam and pertinent patient test results.  I agree with the assessment, diagnosis, and plan of care documented in the resident's note.  I have removed the opioid dependence assessment as I do not believe this is the correct label.  Will change to long term use of opiate medication.

## 2016-06-10 DIAGNOSIS — I639 Cerebral infarction, unspecified: Secondary | ICD-10-CM | POA: Diagnosis not present

## 2016-06-10 DIAGNOSIS — I5040 Unspecified combined systolic (congestive) and diastolic (congestive) heart failure: Secondary | ICD-10-CM | POA: Diagnosis not present

## 2016-06-10 DIAGNOSIS — M150 Primary generalized (osteo)arthritis: Secondary | ICD-10-CM | POA: Diagnosis not present

## 2016-06-10 DIAGNOSIS — G8111 Spastic hemiplegia affecting right dominant side: Secondary | ICD-10-CM | POA: Diagnosis not present

## 2016-06-13 DIAGNOSIS — I5040 Unspecified combined systolic (congestive) and diastolic (congestive) heart failure: Secondary | ICD-10-CM | POA: Diagnosis not present

## 2016-06-15 DIAGNOSIS — G4733 Obstructive sleep apnea (adult) (pediatric): Secondary | ICD-10-CM | POA: Diagnosis not present

## 2016-06-19 ENCOUNTER — Ambulatory Visit: Payer: Medicare Other | Admitting: Physical Medicine & Rehabilitation

## 2016-06-21 DIAGNOSIS — J449 Chronic obstructive pulmonary disease, unspecified: Secondary | ICD-10-CM | POA: Diagnosis not present

## 2016-06-29 ENCOUNTER — Encounter: Payer: Medicare Other | Attending: Physical Medicine & Rehabilitation

## 2016-06-29 ENCOUNTER — Ambulatory Visit: Payer: Medicare Other | Admitting: Physical Medicine & Rehabilitation

## 2016-06-29 DIAGNOSIS — G8114 Spastic hemiplegia affecting left nondominant side: Secondary | ICD-10-CM | POA: Insufficient documentation

## 2016-06-30 DIAGNOSIS — S91309A Unspecified open wound, unspecified foot, initial encounter: Secondary | ICD-10-CM | POA: Diagnosis not present

## 2016-06-30 DIAGNOSIS — L89153 Pressure ulcer of sacral region, stage 3: Secondary | ICD-10-CM | POA: Diagnosis not present

## 2016-07-03 ENCOUNTER — Encounter: Payer: Self-pay | Admitting: Internal Medicine

## 2016-07-07 ENCOUNTER — Telehealth: Payer: Self-pay

## 2016-07-07 NOTE — Telephone Encounter (Signed)
Sharyn Lull from health department needs to speak with a nurse about pt. Would like Bonnita Nasuti to call back.

## 2016-07-08 ENCOUNTER — Ambulatory Visit (INDEPENDENT_AMBULATORY_CARE_PROVIDER_SITE_OTHER): Payer: Medicare Other | Admitting: Internal Medicine

## 2016-07-08 ENCOUNTER — Encounter: Payer: Self-pay | Admitting: Internal Medicine

## 2016-07-08 VITALS — BP 135/77 | Temp 98.4°F

## 2016-07-08 DIAGNOSIS — G8191 Hemiplegia, unspecified affecting right dominant side: Secondary | ICD-10-CM

## 2016-07-08 DIAGNOSIS — Z79891 Long term (current) use of opiate analgesic: Secondary | ICD-10-CM

## 2016-07-08 DIAGNOSIS — Z794 Long term (current) use of insulin: Secondary | ICD-10-CM

## 2016-07-08 DIAGNOSIS — I10 Essential (primary) hypertension: Secondary | ICD-10-CM

## 2016-07-08 DIAGNOSIS — I69351 Hemiplegia and hemiparesis following cerebral infarction affecting right dominant side: Secondary | ICD-10-CM | POA: Diagnosis not present

## 2016-07-08 DIAGNOSIS — I129 Hypertensive chronic kidney disease with stage 1 through stage 4 chronic kidney disease, or unspecified chronic kidney disease: Secondary | ICD-10-CM | POA: Diagnosis not present

## 2016-07-08 DIAGNOSIS — Z79899 Other long term (current) drug therapy: Secondary | ICD-10-CM | POA: Diagnosis not present

## 2016-07-08 DIAGNOSIS — N184 Chronic kidney disease, stage 4 (severe): Secondary | ICD-10-CM

## 2016-07-08 DIAGNOSIS — Z993 Dependence on wheelchair: Secondary | ICD-10-CM

## 2016-07-08 DIAGNOSIS — E1100 Type 2 diabetes mellitus with hyperosmolarity without nonketotic hyperglycemic-hyperosmolar coma (NKHHC): Secondary | ICD-10-CM

## 2016-07-08 DIAGNOSIS — I25119 Atherosclerotic heart disease of native coronary artery with unspecified angina pectoris: Secondary | ICD-10-CM

## 2016-07-08 DIAGNOSIS — E1122 Type 2 diabetes mellitus with diabetic chronic kidney disease: Secondary | ICD-10-CM | POA: Diagnosis not present

## 2016-07-08 DIAGNOSIS — I82412 Acute embolism and thrombosis of left femoral vein: Secondary | ICD-10-CM

## 2016-07-08 LAB — POCT GLYCOSYLATED HEMOGLOBIN (HGB A1C): Hemoglobin A1C: 5.5

## 2016-07-08 LAB — GLUCOSE, CAPILLARY: Glucose-Capillary: 113 mg/dL — ABNORMAL HIGH (ref 65–99)

## 2016-07-08 MED ORDER — OXYCODONE HCL 20 MG PO TABS: 20.0000 mg | ORAL_TABLET | Freq: Three times a day (TID) | ORAL | 0 refills | Status: DC | PRN

## 2016-07-08 NOTE — Progress Notes (Addendum)
   CC: For follow-up of her chronic pain, diabetes and hypertension.  HPI:  30 Michelle Holmes is a 66 y.o. with past medical history as listed below came to the clinic for her chronic pain, diabetes and hypertension.  Chronic pain. Patient is having right hemiplegia after a CVA, complaining of pain in her right limbs and lower back. Her oxycodone dose was increased during previous visit from twice a day to 3 times a day. According to patient that helped with her pain a lot. She is on the using her oxycodone 20 mg 3 times a day. She refused to take any other medications. [place] data base controlled substance were checked and it was appropriate. She did get a Botox in April 2018 by her neurologist for her spasticity, stating that did help little bit. She was asked so asking for a referral for physical therapy with her home health care, which was provided.  Diabetes. Her current A1c is 5.5. She had a caregiver with her who told me that he checks her fasting blood sugar daily and gives her 10 units of Lantus if her blood sugar is between 150-200. She is requiring only 1-2 times per week.  Hypertension. She was prescribed amlodipine 5 mg daily, patient is not taking any medication currently, stating that she do not want to take any more pills except her pain medicines. Her blood pressure was at goal during this visit.  Addendum. UDS was according to expectations.  Past Medical History:  Diagnosis Date  . Anemia   . CHF (congestive heart failure) ([place])   . Chronic back pain   . COPD (chronic obstructive pulmonary disease) ([place])   . Coronary artery disease   . Diabetes mellitus   . DJD (degenerative joint disease)   . Emphysema   . Hypercholesteremia   . Hypertension   . Myocardial infarction ([place])   . Obesity   . Obesity hypoventilation syndrome ([place])   . Renal insufficiency   . Scoliosis   . Sleep apnea   . Stroke ([place])   . TIA (transient ischemic attack)     Review of  Systems:  As per HPI.  Physical Exam:  Vitals:   07/08/16 1317  BP: 135/77  Temp: 98.4 F (36.9 C)  TempSrc: Oral  SpO2: 100%   Gen. Hemiplegic lady, sitting in her wheelchair, in no acute distress. Lungs. Clear bilaterally. CV. Regular rate and rhythm. Abdomen. Soft, nontender, bowel sounds positive. Neuro. Right hemiplegia with 0/5 strength and right upper and lower extremity,4/5 in left upper extremity, unable to assess left lower extremity as patient was not cooperative, stating that it hurts with any movement. Cranial nerves grossly intact. Extremities. No edema, no cyanosis, pulses intact and symmetrical.  Assessment & Plan:   See Encounters Tab for problem based charting.  Patient discussed with Dr. Beryle Beams

## 2016-07-08 NOTE — Patient Instructions (Signed)
Thank you for visiting clinic today. I'm giving you a prescription for your pain meds. These are total 3 prescriptions which Will be lasting for 3 months. Please follow-up in 3 month.

## 2016-07-08 NOTE — Telephone Encounter (Signed)
Have spoken to [facility] nurse several times today, on home situation of pt, she states daughter said pt is not taking any meds except lantus 10 units one time daily she takes it whenever "sugar" is 178 or higher??? Also pt and daughter stated she does not take pain meds or bp meds. She stated that there was a large number of people inside and outside the home and most seemed intoxicated. She stated that pt said she was facing her situation on her own terms Verbally made dr [name] aware.

## 2016-07-09 ENCOUNTER — Telehealth: Payer: Self-pay

## 2016-07-09 NOTE — Assessment & Plan Note (Signed)
Patient refused to give blood for BMP today.

## 2016-07-09 NOTE — Assessment & Plan Note (Signed)
She had an history of femoral DVT she was placed on Xarelto. Patient is noncompliant and refused to take any medications  except her pain meds.

## 2016-07-09 NOTE — Assessment & Plan Note (Signed)
Patient is having right hemiplegia after a CVA, complaining of pain in her right limbs and lower back. Her oxycodone dose was increased during previous visit from twice a day to 3 times a day. According to patient that helped with her pain a lot. She is on the using her oxycodone 20 mg 3 times a day. She refused to take any other medications. [place] data base controlled substance were checked and it was appropriate. She did get a Botox in April 2018 by her neurologist for her spasticity, stating that did help little bit. She was asked so asking for a referral for physical therapy with her home health care, which was provided.  -She was provided with 3 separate prescriptions for oxycodone for 3 month. -Follow-up in 3 month.

## 2016-07-09 NOTE — Assessment & Plan Note (Signed)
Her current A1c is 5.5. She had a caregiver with her who told me that he checks her fasting blood sugar daily and gives her 10 units of Lantus if her blood sugar is between 150-200. She is requiring only 1-2 times per week.  Her diabetes is well controlled. Her caregiver can continue his current practice.

## 2016-07-09 NOTE — Telephone Encounter (Signed)
Requesting to speak with a nurse about [facility] as home health. Please call back.

## 2016-07-09 NOTE — Assessment & Plan Note (Signed)
BP Readings from Last 3 Encounters:  07/08/16 135/77  06/03/16 (!) 157/78  05/11/16 (!) 166/104   She was normotensive today.  She is not using any medications currently. She was placed on amlodipine 5 mg daily for her blood pressure.

## 2016-07-09 NOTE — Assessment & Plan Note (Signed)
Patient denied any chest pain.  She is not taking her Lipitor. She just want to take her pain medications and refusing any other intervention or medication.

## 2016-07-10 ENCOUNTER — Telehealth: Payer: Self-pay | Admitting: Internal Medicine

## 2016-07-10 NOTE — Telephone Encounter (Signed)
Spoke with daughter about sending referral to [facility] she states she is fine with that.

## 2016-07-10 NOTE — Progress Notes (Signed)
Medicine attending: Medical history, presenting problems, physical findings, and medications, reviewed with resident physician Dr Sumayya Amin on the day of the patient visit and I concur with her evaluation and management plan. 

## 2016-07-10 NOTE — Telephone Encounter (Signed)
   Reason for call:   I received a call from [name] at 4:52 PM indicating that her mother's urinary catheter bag is leaking.   Pertinent Data:   Patient has a chronic urinary catheter and has noticed her urinary bag is leaking. Catheter is otherwise functioning well without irritation or pain per patient's daughter.   Assessment / Plan / Recommendations:   I advised patient to contact her urinary catheter supplier ([employer]) to try to obtain a new bag, although this may be difficult after hours and over the weekend.  As always, pt is advised that if symptoms worsen or new symptoms arise, they should go to an urgent care facility or to to ER for further evaluation.   Zada Finders, MD   07/10/2016, 5:35 PM

## 2016-07-11 DIAGNOSIS — Z466 Encounter for fitting and adjustment of urinary device: Secondary | ICD-10-CM | POA: Diagnosis not present

## 2016-07-11 DIAGNOSIS — I2510 Atherosclerotic heart disease of native coronary artery without angina pectoris: Secondary | ICD-10-CM | POA: Diagnosis not present

## 2016-07-11 DIAGNOSIS — E1122 Type 2 diabetes mellitus with diabetic chronic kidney disease: Secondary | ICD-10-CM | POA: Diagnosis not present

## 2016-07-11 DIAGNOSIS — Z86718 Personal history of other venous thrombosis and embolism: Secondary | ICD-10-CM | POA: Diagnosis not present

## 2016-07-11 DIAGNOSIS — E785 Hyperlipidemia, unspecified: Secondary | ICD-10-CM | POA: Diagnosis not present

## 2016-07-11 DIAGNOSIS — I69391 Dysphagia following cerebral infarction: Secondary | ICD-10-CM | POA: Diagnosis not present

## 2016-07-11 DIAGNOSIS — I5032 Chronic diastolic (congestive) heart failure: Secondary | ICD-10-CM | POA: Diagnosis not present

## 2016-07-11 DIAGNOSIS — D631 Anemia in chronic kidney disease: Secondary | ICD-10-CM | POA: Diagnosis not present

## 2016-07-11 DIAGNOSIS — M1990 Unspecified osteoarthritis, unspecified site: Secondary | ICD-10-CM | POA: Diagnosis not present

## 2016-07-11 DIAGNOSIS — R1310 Dysphagia, unspecified: Secondary | ICD-10-CM | POA: Diagnosis not present

## 2016-07-11 DIAGNOSIS — G4733 Obstructive sleep apnea (adult) (pediatric): Secondary | ICD-10-CM | POA: Diagnosis not present

## 2016-07-11 DIAGNOSIS — Z7901 Long term (current) use of anticoagulants: Secondary | ICD-10-CM | POA: Diagnosis not present

## 2016-07-11 DIAGNOSIS — I69351 Hemiplegia and hemiparesis following cerebral infarction affecting right dominant side: Secondary | ICD-10-CM | POA: Diagnosis not present

## 2016-07-11 DIAGNOSIS — I69320 Aphasia following cerebral infarction: Secondary | ICD-10-CM | POA: Diagnosis not present

## 2016-07-11 DIAGNOSIS — N184 Chronic kidney disease, stage 4 (severe): Secondary | ICD-10-CM | POA: Diagnosis not present

## 2016-07-11 DIAGNOSIS — I130 Hypertensive heart and chronic kidney disease with heart failure and stage 1 through stage 4 chronic kidney disease, or unspecified chronic kidney disease: Secondary | ICD-10-CM | POA: Diagnosis not present

## 2016-07-11 DIAGNOSIS — J439 Emphysema, unspecified: Secondary | ICD-10-CM | POA: Diagnosis not present

## 2016-07-14 DIAGNOSIS — I5032 Chronic diastolic (congestive) heart failure: Secondary | ICD-10-CM | POA: Diagnosis not present

## 2016-07-14 DIAGNOSIS — N184 Chronic kidney disease, stage 4 (severe): Secondary | ICD-10-CM | POA: Diagnosis not present

## 2016-07-14 DIAGNOSIS — D631 Anemia in chronic kidney disease: Secondary | ICD-10-CM | POA: Diagnosis not present

## 2016-07-14 DIAGNOSIS — I69320 Aphasia following cerebral infarction: Secondary | ICD-10-CM | POA: Diagnosis not present

## 2016-07-14 DIAGNOSIS — M1990 Unspecified osteoarthritis, unspecified site: Secondary | ICD-10-CM | POA: Diagnosis not present

## 2016-07-14 DIAGNOSIS — R1310 Dysphagia, unspecified: Secondary | ICD-10-CM | POA: Diagnosis not present

## 2016-07-14 DIAGNOSIS — G4733 Obstructive sleep apnea (adult) (pediatric): Secondary | ICD-10-CM | POA: Diagnosis not present

## 2016-07-14 DIAGNOSIS — Z86718 Personal history of other venous thrombosis and embolism: Secondary | ICD-10-CM | POA: Diagnosis not present

## 2016-07-14 DIAGNOSIS — Z7901 Long term (current) use of anticoagulants: Secondary | ICD-10-CM | POA: Diagnosis not present

## 2016-07-14 DIAGNOSIS — I130 Hypertensive heart and chronic kidney disease with heart failure and stage 1 through stage 4 chronic kidney disease, or unspecified chronic kidney disease: Secondary | ICD-10-CM | POA: Diagnosis not present

## 2016-07-14 DIAGNOSIS — Z466 Encounter for fitting and adjustment of urinary device: Secondary | ICD-10-CM | POA: Diagnosis not present

## 2016-07-14 DIAGNOSIS — I2510 Atherosclerotic heart disease of native coronary artery without angina pectoris: Secondary | ICD-10-CM | POA: Diagnosis not present

## 2016-07-14 DIAGNOSIS — J439 Emphysema, unspecified: Secondary | ICD-10-CM | POA: Diagnosis not present

## 2016-07-14 DIAGNOSIS — E1122 Type 2 diabetes mellitus with diabetic chronic kidney disease: Secondary | ICD-10-CM | POA: Diagnosis not present

## 2016-07-14 DIAGNOSIS — I69391 Dysphagia following cerebral infarction: Secondary | ICD-10-CM | POA: Diagnosis not present

## 2016-07-14 DIAGNOSIS — E785 Hyperlipidemia, unspecified: Secondary | ICD-10-CM | POA: Diagnosis not present

## 2016-07-14 DIAGNOSIS — I5040 Unspecified combined systolic (congestive) and diastolic (congestive) heart failure: Secondary | ICD-10-CM | POA: Diagnosis not present

## 2016-07-14 DIAGNOSIS — I69351 Hemiplegia and hemiparesis following cerebral infarction affecting right dominant side: Secondary | ICD-10-CM | POA: Diagnosis not present

## 2016-07-15 DIAGNOSIS — G4733 Obstructive sleep apnea (adult) (pediatric): Secondary | ICD-10-CM | POA: Diagnosis not present

## 2016-07-15 DIAGNOSIS — E785 Hyperlipidemia, unspecified: Secondary | ICD-10-CM | POA: Diagnosis not present

## 2016-07-15 DIAGNOSIS — E1122 Type 2 diabetes mellitus with diabetic chronic kidney disease: Secondary | ICD-10-CM | POA: Diagnosis not present

## 2016-07-15 DIAGNOSIS — I2510 Atherosclerotic heart disease of native coronary artery without angina pectoris: Secondary | ICD-10-CM | POA: Diagnosis not present

## 2016-07-15 DIAGNOSIS — I69320 Aphasia following cerebral infarction: Secondary | ICD-10-CM | POA: Diagnosis not present

## 2016-07-15 DIAGNOSIS — I130 Hypertensive heart and chronic kidney disease with heart failure and stage 1 through stage 4 chronic kidney disease, or unspecified chronic kidney disease: Secondary | ICD-10-CM | POA: Diagnosis not present

## 2016-07-15 DIAGNOSIS — I5032 Chronic diastolic (congestive) heart failure: Secondary | ICD-10-CM | POA: Diagnosis not present

## 2016-07-15 DIAGNOSIS — J439 Emphysema, unspecified: Secondary | ICD-10-CM | POA: Diagnosis not present

## 2016-07-15 DIAGNOSIS — Z466 Encounter for fitting and adjustment of urinary device: Secondary | ICD-10-CM | POA: Diagnosis not present

## 2016-07-15 DIAGNOSIS — I69351 Hemiplegia and hemiparesis following cerebral infarction affecting right dominant side: Secondary | ICD-10-CM | POA: Diagnosis not present

## 2016-07-15 DIAGNOSIS — M1990 Unspecified osteoarthritis, unspecified site: Secondary | ICD-10-CM | POA: Diagnosis not present

## 2016-07-15 DIAGNOSIS — Z86718 Personal history of other venous thrombosis and embolism: Secondary | ICD-10-CM | POA: Diagnosis not present

## 2016-07-15 DIAGNOSIS — R1310 Dysphagia, unspecified: Secondary | ICD-10-CM | POA: Diagnosis not present

## 2016-07-15 DIAGNOSIS — D631 Anemia in chronic kidney disease: Secondary | ICD-10-CM | POA: Diagnosis not present

## 2016-07-15 DIAGNOSIS — N184 Chronic kidney disease, stage 4 (severe): Secondary | ICD-10-CM | POA: Diagnosis not present

## 2016-07-15 DIAGNOSIS — Z7901 Long term (current) use of anticoagulants: Secondary | ICD-10-CM | POA: Diagnosis not present

## 2016-07-15 DIAGNOSIS — I69391 Dysphagia following cerebral infarction: Secondary | ICD-10-CM | POA: Diagnosis not present

## 2016-07-15 LAB — TOXASSURE SELECT,+ANTIDEPR,UR

## 2016-07-16 DIAGNOSIS — G4733 Obstructive sleep apnea (adult) (pediatric): Secondary | ICD-10-CM | POA: Diagnosis not present

## 2016-07-20 ENCOUNTER — Telehealth: Payer: Self-pay | Admitting: Internal Medicine

## 2016-07-20 NOTE — Telephone Encounter (Signed)
Yes, that's fine 

## 2016-07-20 NOTE — Telephone Encounter (Signed)
PT asking for VO's .

## 2016-07-20 NOTE — Telephone Encounter (Signed)
VO given for PT 2x week for 6 weeks for mobility, safety, balance, do you agree?

## 2016-07-21 DIAGNOSIS — N184 Chronic kidney disease, stage 4 (severe): Secondary | ICD-10-CM | POA: Diagnosis not present

## 2016-07-21 DIAGNOSIS — Z86718 Personal history of other venous thrombosis and embolism: Secondary | ICD-10-CM | POA: Diagnosis not present

## 2016-07-21 DIAGNOSIS — E1122 Type 2 diabetes mellitus with diabetic chronic kidney disease: Secondary | ICD-10-CM | POA: Diagnosis not present

## 2016-07-21 DIAGNOSIS — J439 Emphysema, unspecified: Secondary | ICD-10-CM | POA: Diagnosis not present

## 2016-07-21 DIAGNOSIS — I69320 Aphasia following cerebral infarction: Secondary | ICD-10-CM | POA: Diagnosis not present

## 2016-07-21 DIAGNOSIS — M1990 Unspecified osteoarthritis, unspecified site: Secondary | ICD-10-CM | POA: Diagnosis not present

## 2016-07-21 DIAGNOSIS — E785 Hyperlipidemia, unspecified: Secondary | ICD-10-CM | POA: Diagnosis not present

## 2016-07-21 DIAGNOSIS — Z466 Encounter for fitting and adjustment of urinary device: Secondary | ICD-10-CM | POA: Diagnosis not present

## 2016-07-21 DIAGNOSIS — I130 Hypertensive heart and chronic kidney disease with heart failure and stage 1 through stage 4 chronic kidney disease, or unspecified chronic kidney disease: Secondary | ICD-10-CM | POA: Diagnosis not present

## 2016-07-21 DIAGNOSIS — R1310 Dysphagia, unspecified: Secondary | ICD-10-CM | POA: Diagnosis not present

## 2016-07-21 DIAGNOSIS — D631 Anemia in chronic kidney disease: Secondary | ICD-10-CM | POA: Diagnosis not present

## 2016-07-21 DIAGNOSIS — I2510 Atherosclerotic heart disease of native coronary artery without angina pectoris: Secondary | ICD-10-CM | POA: Diagnosis not present

## 2016-07-21 DIAGNOSIS — G4733 Obstructive sleep apnea (adult) (pediatric): Secondary | ICD-10-CM | POA: Diagnosis not present

## 2016-07-21 DIAGNOSIS — I69351 Hemiplegia and hemiparesis following cerebral infarction affecting right dominant side: Secondary | ICD-10-CM | POA: Diagnosis not present

## 2016-07-21 DIAGNOSIS — I69391 Dysphagia following cerebral infarction: Secondary | ICD-10-CM | POA: Diagnosis not present

## 2016-07-21 DIAGNOSIS — I5032 Chronic diastolic (congestive) heart failure: Secondary | ICD-10-CM | POA: Diagnosis not present

## 2016-07-21 DIAGNOSIS — Z7901 Long term (current) use of anticoagulants: Secondary | ICD-10-CM | POA: Diagnosis not present

## 2016-07-22 DIAGNOSIS — J449 Chronic obstructive pulmonary disease, unspecified: Secondary | ICD-10-CM | POA: Diagnosis not present

## 2016-07-24 DIAGNOSIS — D631 Anemia in chronic kidney disease: Secondary | ICD-10-CM | POA: Diagnosis not present

## 2016-07-24 DIAGNOSIS — N184 Chronic kidney disease, stage 4 (severe): Secondary | ICD-10-CM | POA: Diagnosis not present

## 2016-07-24 DIAGNOSIS — I130 Hypertensive heart and chronic kidney disease with heart failure and stage 1 through stage 4 chronic kidney disease, or unspecified chronic kidney disease: Secondary | ICD-10-CM | POA: Diagnosis not present

## 2016-07-24 DIAGNOSIS — I69351 Hemiplegia and hemiparesis following cerebral infarction affecting right dominant side: Secondary | ICD-10-CM | POA: Diagnosis not present

## 2016-07-24 DIAGNOSIS — Z466 Encounter for fitting and adjustment of urinary device: Secondary | ICD-10-CM | POA: Diagnosis not present

## 2016-07-24 DIAGNOSIS — I5032 Chronic diastolic (congestive) heart failure: Secondary | ICD-10-CM | POA: Diagnosis not present

## 2016-07-24 DIAGNOSIS — E1122 Type 2 diabetes mellitus with diabetic chronic kidney disease: Secondary | ICD-10-CM | POA: Diagnosis not present

## 2016-07-24 DIAGNOSIS — G4733 Obstructive sleep apnea (adult) (pediatric): Secondary | ICD-10-CM | POA: Diagnosis not present

## 2016-07-24 DIAGNOSIS — M1990 Unspecified osteoarthritis, unspecified site: Secondary | ICD-10-CM | POA: Diagnosis not present

## 2016-07-24 DIAGNOSIS — E785 Hyperlipidemia, unspecified: Secondary | ICD-10-CM | POA: Diagnosis not present

## 2016-07-24 DIAGNOSIS — I2510 Atherosclerotic heart disease of native coronary artery without angina pectoris: Secondary | ICD-10-CM | POA: Diagnosis not present

## 2016-07-24 DIAGNOSIS — Z7901 Long term (current) use of anticoagulants: Secondary | ICD-10-CM | POA: Diagnosis not present

## 2016-07-24 DIAGNOSIS — I69320 Aphasia following cerebral infarction: Secondary | ICD-10-CM | POA: Diagnosis not present

## 2016-07-24 DIAGNOSIS — Z86718 Personal history of other venous thrombosis and embolism: Secondary | ICD-10-CM | POA: Diagnosis not present

## 2016-07-24 DIAGNOSIS — I69391 Dysphagia following cerebral infarction: Secondary | ICD-10-CM | POA: Diagnosis not present

## 2016-07-24 DIAGNOSIS — R1310 Dysphagia, unspecified: Secondary | ICD-10-CM | POA: Diagnosis not present

## 2016-07-24 DIAGNOSIS — J439 Emphysema, unspecified: Secondary | ICD-10-CM | POA: Diagnosis not present

## 2016-07-28 DIAGNOSIS — I69320 Aphasia following cerebral infarction: Secondary | ICD-10-CM | POA: Diagnosis not present

## 2016-07-28 DIAGNOSIS — E785 Hyperlipidemia, unspecified: Secondary | ICD-10-CM | POA: Diagnosis not present

## 2016-07-28 DIAGNOSIS — D631 Anemia in chronic kidney disease: Secondary | ICD-10-CM | POA: Diagnosis not present

## 2016-07-28 DIAGNOSIS — I2510 Atherosclerotic heart disease of native coronary artery without angina pectoris: Secondary | ICD-10-CM | POA: Diagnosis not present

## 2016-07-28 DIAGNOSIS — I130 Hypertensive heart and chronic kidney disease with heart failure and stage 1 through stage 4 chronic kidney disease, or unspecified chronic kidney disease: Secondary | ICD-10-CM | POA: Diagnosis not present

## 2016-07-28 DIAGNOSIS — E1122 Type 2 diabetes mellitus with diabetic chronic kidney disease: Secondary | ICD-10-CM | POA: Diagnosis not present

## 2016-07-28 DIAGNOSIS — N184 Chronic kidney disease, stage 4 (severe): Secondary | ICD-10-CM | POA: Diagnosis not present

## 2016-07-28 DIAGNOSIS — M1990 Unspecified osteoarthritis, unspecified site: Secondary | ICD-10-CM | POA: Diagnosis not present

## 2016-07-28 DIAGNOSIS — R1310 Dysphagia, unspecified: Secondary | ICD-10-CM | POA: Diagnosis not present

## 2016-07-28 DIAGNOSIS — Z86718 Personal history of other venous thrombosis and embolism: Secondary | ICD-10-CM | POA: Diagnosis not present

## 2016-07-28 DIAGNOSIS — Z466 Encounter for fitting and adjustment of urinary device: Secondary | ICD-10-CM | POA: Diagnosis not present

## 2016-07-28 DIAGNOSIS — Z7901 Long term (current) use of anticoagulants: Secondary | ICD-10-CM | POA: Diagnosis not present

## 2016-07-28 DIAGNOSIS — I69351 Hemiplegia and hemiparesis following cerebral infarction affecting right dominant side: Secondary | ICD-10-CM | POA: Diagnosis not present

## 2016-07-28 DIAGNOSIS — J439 Emphysema, unspecified: Secondary | ICD-10-CM | POA: Diagnosis not present

## 2016-07-28 DIAGNOSIS — I5032 Chronic diastolic (congestive) heart failure: Secondary | ICD-10-CM | POA: Diagnosis not present

## 2016-07-28 DIAGNOSIS — G4733 Obstructive sleep apnea (adult) (pediatric): Secondary | ICD-10-CM | POA: Diagnosis not present

## 2016-07-28 DIAGNOSIS — I69391 Dysphagia following cerebral infarction: Secondary | ICD-10-CM | POA: Diagnosis not present

## 2016-07-30 DIAGNOSIS — L89153 Pressure ulcer of sacral region, stage 3: Secondary | ICD-10-CM | POA: Diagnosis not present

## 2016-07-30 DIAGNOSIS — S91309A Unspecified open wound, unspecified foot, initial encounter: Secondary | ICD-10-CM | POA: Diagnosis not present

## 2016-07-31 DIAGNOSIS — Z466 Encounter for fitting and adjustment of urinary device: Secondary | ICD-10-CM | POA: Diagnosis not present

## 2016-07-31 DIAGNOSIS — G8111 Spastic hemiplegia affecting right dominant side: Secondary | ICD-10-CM | POA: Diagnosis not present

## 2016-07-31 DIAGNOSIS — N184 Chronic kidney disease, stage 4 (severe): Secondary | ICD-10-CM | POA: Diagnosis not present

## 2016-07-31 DIAGNOSIS — Z7901 Long term (current) use of anticoagulants: Secondary | ICD-10-CM | POA: Diagnosis not present

## 2016-07-31 DIAGNOSIS — M1990 Unspecified osteoarthritis, unspecified site: Secondary | ICD-10-CM | POA: Diagnosis not present

## 2016-07-31 DIAGNOSIS — E785 Hyperlipidemia, unspecified: Secondary | ICD-10-CM | POA: Diagnosis not present

## 2016-07-31 DIAGNOSIS — I69320 Aphasia following cerebral infarction: Secondary | ICD-10-CM | POA: Diagnosis not present

## 2016-07-31 DIAGNOSIS — I5040 Unspecified combined systolic (congestive) and diastolic (congestive) heart failure: Secondary | ICD-10-CM | POA: Diagnosis not present

## 2016-07-31 DIAGNOSIS — I69351 Hemiplegia and hemiparesis following cerebral infarction affecting right dominant side: Secondary | ICD-10-CM | POA: Diagnosis not present

## 2016-07-31 DIAGNOSIS — E1122 Type 2 diabetes mellitus with diabetic chronic kidney disease: Secondary | ICD-10-CM | POA: Diagnosis not present

## 2016-07-31 DIAGNOSIS — I5032 Chronic diastolic (congestive) heart failure: Secondary | ICD-10-CM | POA: Diagnosis not present

## 2016-07-31 DIAGNOSIS — D631 Anemia in chronic kidney disease: Secondary | ICD-10-CM | POA: Diagnosis not present

## 2016-07-31 DIAGNOSIS — J439 Emphysema, unspecified: Secondary | ICD-10-CM | POA: Diagnosis not present

## 2016-07-31 DIAGNOSIS — R1310 Dysphagia, unspecified: Secondary | ICD-10-CM | POA: Diagnosis not present

## 2016-07-31 DIAGNOSIS — I2510 Atherosclerotic heart disease of native coronary artery without angina pectoris: Secondary | ICD-10-CM | POA: Diagnosis not present

## 2016-07-31 DIAGNOSIS — Z86718 Personal history of other venous thrombosis and embolism: Secondary | ICD-10-CM | POA: Diagnosis not present

## 2016-07-31 DIAGNOSIS — I130 Hypertensive heart and chronic kidney disease with heart failure and stage 1 through stage 4 chronic kidney disease, or unspecified chronic kidney disease: Secondary | ICD-10-CM | POA: Diagnosis not present

## 2016-07-31 DIAGNOSIS — G4733 Obstructive sleep apnea (adult) (pediatric): Secondary | ICD-10-CM | POA: Diagnosis not present

## 2016-07-31 DIAGNOSIS — I69391 Dysphagia following cerebral infarction: Secondary | ICD-10-CM | POA: Diagnosis not present

## 2016-07-31 DIAGNOSIS — I639 Cerebral infarction, unspecified: Secondary | ICD-10-CM | POA: Diagnosis not present

## 2016-07-31 DIAGNOSIS — M150 Primary generalized (osteo)arthritis: Secondary | ICD-10-CM | POA: Diagnosis not present

## 2016-08-03 DIAGNOSIS — Z466 Encounter for fitting and adjustment of urinary device: Secondary | ICD-10-CM | POA: Diagnosis not present

## 2016-08-04 ENCOUNTER — Inpatient Hospital Stay (HOSPITAL_COMMUNITY): Payer: Medicare Other

## 2016-08-04 ENCOUNTER — Emergency Department (HOSPITAL_COMMUNITY): Payer: Medicare Other

## 2016-08-04 ENCOUNTER — Encounter (HOSPITAL_COMMUNITY)
Admission: EM | Disposition: A | Discharge status: Home Health | Payer: Self-pay | Source: Home / Self Care | Attending: Internal Medicine

## 2016-08-04 ENCOUNTER — Emergency Department (HOSPITAL_COMMUNITY): Payer: Medicare Other | Admitting: Certified Registered Nurse Anesthetist

## 2016-08-04 ENCOUNTER — Encounter (HOSPITAL_COMMUNITY): Payer: Self-pay

## 2016-08-04 ENCOUNTER — Inpatient Hospital Stay (HOSPITAL_COMMUNITY)
Admission: EM | Admit: 2016-08-04 | Discharge: 2016-08-08 | DRG: 871 | Disposition: A | Discharge status: Home Health | Payer: Medicare Other | Attending: Internal Medicine | Admitting: Internal Medicine

## 2016-08-04 DIAGNOSIS — Z7901 Long term (current) use of anticoagulants: Secondary | ICD-10-CM | POA: Diagnosis not present

## 2016-08-04 DIAGNOSIS — E785 Hyperlipidemia, unspecified: Secondary | ICD-10-CM | POA: Diagnosis not present

## 2016-08-04 DIAGNOSIS — Z794 Long term (current) use of insulin: Secondary | ICD-10-CM

## 2016-08-04 DIAGNOSIS — A419 Sepsis, unspecified organism: Principal | ICD-10-CM | POA: Diagnosis present

## 2016-08-04 DIAGNOSIS — I160 Hypertensive urgency: Secondary | ICD-10-CM | POA: Diagnosis present

## 2016-08-04 DIAGNOSIS — Z833 Family history of diabetes mellitus: Secondary | ICD-10-CM | POA: Diagnosis not present

## 2016-08-04 DIAGNOSIS — R402142 Coma scale, eyes open, spontaneous, at arrival to emergency department: Secondary | ICD-10-CM | POA: Diagnosis not present

## 2016-08-04 DIAGNOSIS — R402362 Coma scale, best motor response, obeys commands, at arrival to emergency department: Secondary | ICD-10-CM | POA: Diagnosis not present

## 2016-08-04 DIAGNOSIS — G4733 Obstructive sleep apnea (adult) (pediatric): Secondary | ICD-10-CM | POA: Diagnosis not present

## 2016-08-04 DIAGNOSIS — N184 Chronic kidney disease, stage 4 (severe): Secondary | ICD-10-CM | POA: Diagnosis not present

## 2016-08-04 DIAGNOSIS — Z880 Allergy status to penicillin: Secondary | ICD-10-CM

## 2016-08-04 DIAGNOSIS — M1990 Unspecified osteoarthritis, unspecified site: Secondary | ICD-10-CM | POA: Diagnosis not present

## 2016-08-04 DIAGNOSIS — R410 Disorientation, unspecified: Secondary | ICD-10-CM | POA: Diagnosis not present

## 2016-08-04 DIAGNOSIS — Z9989 Dependence on other enabling machines and devices: Secondary | ICD-10-CM | POA: Diagnosis not present

## 2016-08-04 DIAGNOSIS — N201 Calculus of ureter: Secondary | ICD-10-CM | POA: Diagnosis not present

## 2016-08-04 DIAGNOSIS — R7881 Bacteremia: Secondary | ICD-10-CM | POA: Diagnosis not present

## 2016-08-04 DIAGNOSIS — R4182 Altered mental status, unspecified: Secondary | ICD-10-CM | POA: Diagnosis present

## 2016-08-04 DIAGNOSIS — Z955 Presence of coronary angioplasty implant and graft: Secondary | ICD-10-CM

## 2016-08-04 DIAGNOSIS — T83511A Infection and inflammatory reaction due to indwelling urethral catheter, initial encounter: Secondary | ICD-10-CM

## 2016-08-04 DIAGNOSIS — Z86718 Personal history of other venous thrombosis and embolism: Secondary | ICD-10-CM

## 2016-08-04 DIAGNOSIS — Z9114 Patient's other noncompliance with medication regimen: Secondary | ICD-10-CM

## 2016-08-04 DIAGNOSIS — R402252 Coma scale, best verbal response, oriented, at arrival to emergency department: Secondary | ICD-10-CM | POA: Diagnosis not present

## 2016-08-04 DIAGNOSIS — N200 Calculus of kidney: Secondary | ICD-10-CM

## 2016-08-04 DIAGNOSIS — R109 Unspecified abdominal pain: Secondary | ICD-10-CM

## 2016-08-04 DIAGNOSIS — I2510 Atherosclerotic heart disease of native coronary artery without angina pectoris: Secondary | ICD-10-CM | POA: Diagnosis present

## 2016-08-04 DIAGNOSIS — R112 Nausea with vomiting, unspecified: Secondary | ICD-10-CM | POA: Diagnosis not present

## 2016-08-04 DIAGNOSIS — N136 Pyonephrosis: Secondary | ICD-10-CM | POA: Diagnosis present

## 2016-08-04 DIAGNOSIS — I69391 Dysphagia following cerebral infarction: Secondary | ICD-10-CM | POA: Diagnosis not present

## 2016-08-04 DIAGNOSIS — I69351 Hemiplegia and hemiparesis following cerebral infarction affecting right dominant side: Secondary | ICD-10-CM | POA: Diagnosis not present

## 2016-08-04 DIAGNOSIS — R390 Extravasation of urine: Secondary | ICD-10-CM | POA: Diagnosis not present

## 2016-08-04 DIAGNOSIS — G9341 Metabolic encephalopathy: Secondary | ICD-10-CM | POA: Diagnosis not present

## 2016-08-04 DIAGNOSIS — R9431 Abnormal electrocardiogram [ECG] [EKG]: Secondary | ICD-10-CM | POA: Diagnosis not present

## 2016-08-04 DIAGNOSIS — E1122 Type 2 diabetes mellitus with diabetic chronic kidney disease: Secondary | ICD-10-CM | POA: Diagnosis not present

## 2016-08-04 DIAGNOSIS — I5030 Unspecified diastolic (congestive) heart failure: Secondary | ICD-10-CM | POA: Diagnosis present

## 2016-08-04 DIAGNOSIS — Z7902 Long term (current) use of antithrombotics/antiplatelets: Secondary | ICD-10-CM

## 2016-08-04 DIAGNOSIS — B952 Enterococcus as the cause of diseases classified elsewhere: Secondary | ICD-10-CM | POA: Diagnosis not present

## 2016-08-04 DIAGNOSIS — I10 Essential (primary) hypertension: Secondary | ICD-10-CM | POA: Diagnosis present

## 2016-08-04 DIAGNOSIS — I82419 Acute embolism and thrombosis of unspecified femoral vein: Secondary | ICD-10-CM | POA: Diagnosis present

## 2016-08-04 DIAGNOSIS — N390 Urinary tract infection, site not specified: Secondary | ICD-10-CM | POA: Diagnosis not present

## 2016-08-04 DIAGNOSIS — J439 Emphysema, unspecified: Secondary | ICD-10-CM | POA: Diagnosis not present

## 2016-08-04 DIAGNOSIS — J449 Chronic obstructive pulmonary disease, unspecified: Secondary | ICD-10-CM | POA: Diagnosis not present

## 2016-08-04 DIAGNOSIS — Z466 Encounter for fitting and adjustment of urinary device: Secondary | ICD-10-CM | POA: Diagnosis not present

## 2016-08-04 DIAGNOSIS — I5032 Chronic diastolic (congestive) heart failure: Secondary | ICD-10-CM | POA: Diagnosis not present

## 2016-08-04 DIAGNOSIS — Z79899 Other long term (current) drug therapy: Secondary | ICD-10-CM

## 2016-08-04 DIAGNOSIS — I69320 Aphasia following cerebral infarction: Secondary | ICD-10-CM | POA: Diagnosis not present

## 2016-08-04 DIAGNOSIS — I639 Cerebral infarction, unspecified: Secondary | ICD-10-CM | POA: Diagnosis present

## 2016-08-04 DIAGNOSIS — K5730 Diverticulosis of large intestine without perforation or abscess without bleeding: Secondary | ICD-10-CM | POA: Diagnosis not present

## 2016-08-04 DIAGNOSIS — Z8673 Personal history of transient ischemic attack (TIA), and cerebral infarction without residual deficits: Secondary | ICD-10-CM | POA: Diagnosis not present

## 2016-08-04 DIAGNOSIS — D649 Anemia, unspecified: Secondary | ICD-10-CM | POA: Diagnosis not present

## 2016-08-04 DIAGNOSIS — R1310 Dysphagia, unspecified: Secondary | ICD-10-CM | POA: Diagnosis not present

## 2016-08-04 DIAGNOSIS — R319 Hematuria, unspecified: Secondary | ICD-10-CM

## 2016-08-04 DIAGNOSIS — N202 Calculus of kidney with calculus of ureter: Secondary | ICD-10-CM | POA: Diagnosis not present

## 2016-08-04 DIAGNOSIS — I82412 Acute embolism and thrombosis of left femoral vein: Secondary | ICD-10-CM | POA: Diagnosis not present

## 2016-08-04 DIAGNOSIS — I130 Hypertensive heart and chronic kidney disease with heart failure and stage 1 through stage 4 chronic kidney disease, or unspecified chronic kidney disease: Secondary | ICD-10-CM | POA: Diagnosis present

## 2016-08-04 DIAGNOSIS — E118 Type 2 diabetes mellitus with unspecified complications: Secondary | ICD-10-CM | POA: Diagnosis not present

## 2016-08-04 DIAGNOSIS — D631 Anemia in chronic kidney disease: Secondary | ICD-10-CM | POA: Diagnosis not present

## 2016-08-04 DIAGNOSIS — I1310 Hypertensive heart and chronic kidney disease without heart failure, with stage 1 through stage 4 chronic kidney disease, or unspecified chronic kidney disease: Secondary | ICD-10-CM | POA: Diagnosis not present

## 2016-08-04 DIAGNOSIS — K5900 Constipation, unspecified: Secondary | ICD-10-CM | POA: Diagnosis not present

## 2016-08-04 DIAGNOSIS — G464 Cerebellar stroke syndrome: Secondary | ICD-10-CM | POA: Diagnosis not present

## 2016-08-04 DIAGNOSIS — Z823 Family history of stroke: Secondary | ICD-10-CM | POA: Diagnosis not present

## 2016-08-04 DIAGNOSIS — N1330 Unspecified hydronephrosis: Secondary | ICD-10-CM | POA: Diagnosis not present

## 2016-08-04 DIAGNOSIS — R29810 Facial weakness: Secondary | ICD-10-CM | POA: Diagnosis not present

## 2016-08-04 HISTORY — PX: CYSTOSCOPY WITH STENT PLACEMENT: SHX5790

## 2016-08-04 LAB — DIFFERENTIAL
Basophils Absolute: 0 10*3/uL (ref 0.0–0.1)
Basophils Relative: 0 %
Eosinophils Absolute: 0.1 10*3/uL (ref 0.0–0.7)
Eosinophils Relative: 1 %
Lymphocytes Relative: 9 %
Lymphs Abs: 1 10*3/uL (ref 0.7–4.0)
Monocytes Absolute: 0.1 10*3/uL (ref 0.1–1.0)
Monocytes Relative: 1 %
Neutro Abs: 9.9 10*3/uL — ABNORMAL HIGH (ref 1.7–7.7)
Neutrophils Relative %: 89 %

## 2016-08-04 LAB — I-STAT CHEM 8, ED
BUN: 49 mg/dL — ABNORMAL HIGH (ref 6–20)
Calcium, Ion: 1.21 mmol/L (ref 1.15–1.40)
Chloride: 113 mmol/L — ABNORMAL HIGH (ref 101–111)
Creatinine, Ser: 2.2 mg/dL — ABNORMAL HIGH (ref 0.44–1.00)
Glucose, Bld: 159 mg/dL — ABNORMAL HIGH (ref 65–99)
HCT: 28 % — ABNORMAL LOW (ref 36.0–46.0)
Hemoglobin: 9.5 g/dL — ABNORMAL LOW (ref 12.0–15.0)
Potassium: 4.5 mmol/L (ref 3.5–5.1)
Sodium: 143 mmol/L (ref 135–145)
TCO2: 22 mmol/L (ref 0–100)

## 2016-08-04 LAB — URINALYSIS, MICROSCOPIC (REFLEX)

## 2016-08-04 LAB — URINALYSIS, ROUTINE W REFLEX MICROSCOPIC
Bilirubin Urine: NEGATIVE
Glucose, UA: NEGATIVE mg/dL
Ketones, ur: NEGATIVE mg/dL
Nitrite: NEGATIVE
Protein, ur: 30 mg/dL — AB
Specific Gravity, Urine: 1.015 (ref 1.005–1.030)
pH: 5.5 (ref 5.0–8.0)

## 2016-08-04 LAB — PROTIME-INR
INR: 1.08
Prothrombin Time: 14 seconds (ref 11.4–15.2)

## 2016-08-04 LAB — COMPREHENSIVE METABOLIC PANEL
ALT: 8 U/L — ABNORMAL LOW (ref 14–54)
AST: 16 U/L (ref 15–41)
Albumin: 3.5 g/dL (ref 3.5–5.0)
Alkaline Phosphatase: 53 U/L (ref 38–126)
Anion gap: 10 (ref 5–15)
BUN: 40 mg/dL — ABNORMAL HIGH (ref 6–20)
CO2: 18 mmol/L — ABNORMAL LOW (ref 22–32)
Calcium: 9.1 mg/dL (ref 8.9–10.3)
Chloride: 114 mmol/L — ABNORMAL HIGH (ref 101–111)
Creatinine, Ser: 2.24 mg/dL — ABNORMAL HIGH (ref 0.44–1.00)
GFR calc Af Amer: 25 mL/min — ABNORMAL LOW (ref >60)
GFR calc non Af Amer: 22 mL/min — ABNORMAL LOW (ref >60)
Glucose, Bld: 162 mg/dL — ABNORMAL HIGH (ref 65–99)
Potassium: 4.5 mmol/L (ref 3.5–5.1)
Sodium: 142 mmol/L (ref 135–145)
Total Bilirubin: 0.8 mg/dL (ref 0.3–1.2)
Total Protein: 6.4 g/dL — ABNORMAL LOW (ref 6.5–8.1)

## 2016-08-04 LAB — CBC
HCT: 30.8 % — ABNORMAL LOW (ref 36.0–46.0)
Hemoglobin: 10.2 g/dL — ABNORMAL LOW (ref 12.0–15.0)
MCH: 30.5 pg (ref 26.0–34.0)
MCHC: 33.1 g/dL (ref 30.0–36.0)
MCV: 92.2 fL (ref 78.0–100.0)
Platelets: 219 10*3/uL (ref 150–400)
RBC: 3.34 MIL/uL — ABNORMAL LOW (ref 3.87–5.11)
RDW: 14.6 % (ref 11.5–15.5)
WBC: 11.1 10*3/uL — ABNORMAL HIGH (ref 4.0–10.5)

## 2016-08-04 LAB — APTT: aPTT: 32 seconds (ref 24–36)

## 2016-08-04 LAB — I-STAT CG4 LACTIC ACID, ED: Lactic Acid, Venous: 1.73 mmol/L (ref 0.5–1.9)

## 2016-08-04 LAB — I-STAT TROPONIN, ED: Troponin i, poc: 0.01 ng/mL (ref 0.00–0.08)

## 2016-08-04 LAB — CBG MONITORING, ED: Glucose-Capillary: 159 mg/dL — ABNORMAL HIGH (ref 65–99)

## 2016-08-04 LAB — GLUCOSE, CAPILLARY: Glucose-Capillary: 165 mg/dL — ABNORMAL HIGH (ref 65–99)

## 2016-08-04 SURGERY — CYSTOSCOPY, WITH STENT INSERTION
Anesthesia: General | Site: Abdomen | Laterality: Bilateral

## 2016-08-04 MED ORDER — VANCOMYCIN HCL IN DEXTROSE 1-5 GM/200ML-% IV SOLN: 1000.0000 mg | INTRAVENOUS | Status: DC

## 2016-08-04 MED ORDER — SODIUM CHLORIDE 0.9% FLUSH
3.0000 mL | Freq: Two times a day (BID) | INTRAVENOUS | Status: DC
  Administered 2016-08-05 – 2016-08-08 (×5): 3 mL via INTRAVENOUS

## 2016-08-04 MED ORDER — DEXAMETHASONE SODIUM PHOSPHATE 10 MG/ML IJ SOLN
INTRAMUSCULAR | Status: AC
  Filled 2016-08-04: qty 1

## 2016-08-04 MED ORDER — DEXAMETHASONE SODIUM PHOSPHATE 4 MG/ML IJ SOLN
INTRAMUSCULAR | Status: DC | PRN
  Administered 2016-08-04: 5 mg via INTRAVENOUS

## 2016-08-04 MED ORDER — CIPROFLOXACIN IN D5W 400 MG/200ML IV SOLN
400.0000 mg | Freq: Once | INTRAVENOUS | Status: AC
  Administered 2016-08-04: 400 mg via INTRAVENOUS
  Filled 2016-08-04: qty 200

## 2016-08-04 MED ORDER — PROPOFOL 10 MG/ML IV BOLUS
INTRAVENOUS | Status: DC | PRN
  Administered 2016-08-04 (×2): 40 mg via INTRAVENOUS
  Administered 2016-08-04: 50 mg via INTRAVENOUS
  Administered 2016-08-04: 110 mg via INTRAVENOUS

## 2016-08-04 MED ORDER — SUCCINYLCHOLINE CHLORIDE 20 MG/ML IJ SOLN
INTRAMUSCULAR | Status: DC | PRN
Start: 2016-08-04 — End: 2016-08-04
  Administered 2016-08-04: 100 mg via INTRAVENOUS

## 2016-08-04 MED ORDER — INSULIN ASPART 100 UNIT/ML ~~LOC~~ SOLN: 0.0000 [IU] | Freq: Every day | SUBCUTANEOUS | Status: DC

## 2016-08-04 MED ORDER — SUCCINYLCHOLINE CHLORIDE 200 MG/10ML IV SOSY
PREFILLED_SYRINGE | INTRAVENOUS | Status: AC
  Filled 2016-08-04: qty 10

## 2016-08-04 MED ORDER — IOPAMIDOL (ISOVUE-300) INJECTION 61%
INTRAVENOUS | Status: AC
  Filled 2016-08-04: qty 30

## 2016-08-04 MED ORDER — ACETAMINOPHEN 325 MG PO TABS: 650.0000 mg | ORAL_TABLET | Freq: Four times a day (QID) | ORAL | Status: DC | PRN

## 2016-08-04 MED ORDER — INSULIN ASPART 100 UNIT/ML ~~LOC~~ SOLN
0.0000 [IU] | Freq: Three times a day (TID) | SUBCUTANEOUS | Status: DC
  Administered 2016-08-05 (×2): 2 [IU] via SUBCUTANEOUS
  Administered 2016-08-05: 1 [IU] via SUBCUTANEOUS
  Administered 2016-08-06: 2 [IU] via SUBCUTANEOUS
  Administered 2016-08-07: 1 [IU] via SUBCUTANEOUS

## 2016-08-04 MED ORDER — SODIUM CHLORIDE 0.9 % IR SOLN
Status: DC | PRN
  Administered 2016-08-04: 3000 mL

## 2016-08-04 MED ORDER — SODIUM CHLORIDE 0.9 % IV SOLN: INTRAVENOUS | Status: DC

## 2016-08-04 MED ORDER — DEXTROSE 5 % IV SOLN
1.0000 g | Freq: Three times a day (TID) | INTRAVENOUS | Status: DC
  Filled 2016-08-04: qty 1

## 2016-08-04 MED ORDER — ONDANSETRON HCL 4 MG/2ML IJ SOLN
4.0000 mg | Freq: Three times a day (TID) | INTRAMUSCULAR | Status: DC | PRN
  Administered 2016-08-04 – 2016-08-05 (×2): 4 mg via INTRAVENOUS
  Filled 2016-08-04: qty 2

## 2016-08-04 MED ORDER — ONDANSETRON HCL 4 MG/2ML IJ SOLN
4.0000 mg | Freq: Once | INTRAMUSCULAR | Status: AC
  Administered 2016-08-04: 4 mg via INTRAVENOUS
  Filled 2016-08-04: qty 2

## 2016-08-04 MED ORDER — HYDRALAZINE HCL 20 MG/ML IJ SOLN
10.0000 mg | Freq: Once | INTRAMUSCULAR | Status: DC
  Filled 2016-08-04: qty 0.5

## 2016-08-04 MED ORDER — ONDANSETRON HCL 4 MG/2ML IJ SOLN
INTRAMUSCULAR | Status: AC
  Filled 2016-08-04: qty 2

## 2016-08-04 MED ORDER — PHENYLEPHRINE HCL 10 MG/ML IJ SOLN
INTRAMUSCULAR | Status: DC | PRN
  Administered 2016-08-04 (×3): 80 ug via INTRAVENOUS

## 2016-08-04 MED ORDER — HYDRALAZINE HCL 20 MG/ML IJ SOLN
5.0000 mg | INTRAMUSCULAR | Status: DC | PRN
  Filled 2016-08-04: qty 0.25

## 2016-08-04 MED ORDER — LIDOCAINE 2% (20 MG/ML) 5 ML SYRINGE
INTRAMUSCULAR | Status: AC
  Filled 2016-08-04: qty 5

## 2016-08-04 MED ORDER — IOHEXOL 300 MG/ML  SOLN
INTRAMUSCULAR | Status: DC | PRN
  Administered 2016-08-04: 50 mL

## 2016-08-04 MED ORDER — PROPOFOL 10 MG/ML IV BOLUS
INTRAVENOUS | Status: AC
  Filled 2016-08-04: qty 20

## 2016-08-04 MED ORDER — LIDOCAINE HCL (CARDIAC) 20 MG/ML IV SOLN
INTRAVENOUS | Status: DC | PRN
  Administered 2016-08-04: 60 mg via INTRAVENOUS

## 2016-08-04 MED ORDER — LACTATED RINGERS IV SOLN
INTRAVENOUS | Status: DC | PRN
  Administered 2016-08-04: 23:00:00 via INTRAVENOUS

## 2016-08-04 MED ORDER — FENTANYL CITRATE (PF) 100 MCG/2ML IJ SOLN: 25.0000 ug | INTRAMUSCULAR | Status: DC | PRN

## 2016-08-04 MED ORDER — AZTREONAM 1 G IJ SOLR
2.0000 g | INTRAMUSCULAR | Status: DC
  Filled 2016-08-04: qty 2

## 2016-08-04 MED ORDER — 0.9 % SODIUM CHLORIDE (POUR BTL) OPTIME
TOPICAL | Status: DC | PRN
  Administered 2016-08-04: 1000 mL

## 2016-08-04 MED ORDER — ACETAMINOPHEN 650 MG RE SUPP: 650.0000 mg | Freq: Four times a day (QID) | RECTAL | Status: DC | PRN

## 2016-08-04 SURGICAL SUPPLY — 14 items
BAG URINE DRAINAGE (UROLOGICAL SUPPLIES) ×1 IMPLANT
BAG URO CATCHER STRL LF (MISCELLANEOUS) ×2 IMPLANT
CATH FOLEY 2WAY SLVR  5CC 16FR (CATHETERS) ×1
CATH FOLEY 2WAY SLVR 5CC 16FR (CATHETERS) IMPLANT
CATH INTERMIT  6FR 70CM (CATHETERS) ×2 IMPLANT
CLOTH BEACON ORANGE TIMEOUT ST (SAFETY) ×2 IMPLANT
COVER SURGICAL LIGHT HANDLE (MISCELLANEOUS) ×2 IMPLANT
GLOVE BIOGEL M STRL SZ7.5 (GLOVE) ×2 IMPLANT
GOWN STRL REUS W/TWL LRG LVL3 (GOWN DISPOSABLE) ×4 IMPLANT
GUIDEWIRE STR DUAL SENSOR (WIRE) ×2 IMPLANT
MANIFOLD NEPTUNE II (INSTRUMENTS) ×2 IMPLANT
PACK CYSTO (CUSTOM PROCEDURE TRAY) ×2 IMPLANT
STENT URET 6FRX24 CONTOUR (STENTS) ×2 IMPLANT
TUBING CONNECTING 10 (TUBING) IMPLANT

## 2016-08-04 NOTE — Anesthesia Postprocedure Evaluation (Signed)
Anesthesia Post Note  Patient: ANNTOINETTE HAEFELE  Procedure(s) Performed: Procedure(s) (LRB): CYSTOSCOPY WITH STENT PLACEMENT bilateral bilateral retrograde fecal disimpaction (Bilateral)     Patient location during evaluation: PACU Anesthesia Type: General Level of consciousness: awake and alert Pain management: pain level controlled Vital Signs Assessment: post-procedure vital signs reviewed and stable Respiratory status: spontaneous breathing, nonlabored ventilation, respiratory function stable and patient connected to nasal cannula oxygen Cardiovascular status: blood pressure returned to baseline and stable Postop Assessment: no signs of nausea or vomiting Anesthetic complications: no    Last Vitals:  Vitals:   08/04/16 2345 08/04/16 2352  BP: (!) 145/90 (!) 147/90  Pulse: 90 87  Resp: 15 20  Temp:      Last Pain:  Vitals:   08/04/16 1616  TempSrc:   PainSc: 2                  Tiajuana Amass

## 2016-08-04 NOTE — Consult Note (Signed)
I have been asked to see the patient by Dr. Lacretia Leigh, for evaluation and management of bilateral ureteral stones with altered mental status and nausea/vomiting.Marland Kitchen  History of present illness: 66 year old female known to urology for an urgent bladder and recurrent urinary tract infections who presented to the emergency department brought by her family for altered mental status, nausea, and vomiting. A thorough evaluation of the patient demonstrated that she had bilateral obstructing ureteral stones. Her white count was normal, she had no fever, but she does have altered mental status and history of recurrent urinary tract infections.  Given the above, one consult it, I recommended she be transferred to the [facility] for further management including bilateral ureteral stent placement. Upon arrival, the patient was noncommunicative and somnolent. The patient states that she is well off her baseline. She was treated for hypertension in the emergency department and cultures were drawn.  Review of systems: A 12 point comprehensive review of systems was obtained and is negative unless otherwise stated in the history of present illness.  Patient Active Problem List   Diagnosis Date Noted  . Stroke (cerebrum) ([place]) 08/04/2016  . CKD (chronic kidney disease), stage IV ([place]) 08/04/2016  . Hypertensive urgency 08/04/2016  . Kidney stones 08/04/2016  . Long-term current use of opiate analgesic 03/05/2016  . Essential hypertension 03/05/2016  . Dvt femoral (deep venous thrombosis) ([place]) 02/17/2016  . Chronic diastolic CHF (congestive heart failure) ([place]) 01/28/2016  . Dehydration 01/28/2016  . Protein-calorie malnutrition, severe 01/28/2016  . Emesis   . Goals of care, counseling/discussion   . Palliative care by specialist   . Anemia in other chronic diseases classified elsewhere   . Acute urinary retention   . Pressure injury of skin 11/24/2015  . Chronic kidney disease (CKD), stage IV  (severe) ([place]) 07/06/2015  . Acute CVA (cerebrovascular accident) ([place])   . Sinus bradycardia 07/04/2015  . Complicated UTI (urinary tract infection) 07/04/2015  . Parietal lobe infarction ([place]) 07/04/2015  . Spastic hemiplegia affecting nondominant side ([place]) 07/01/2015  . Obesity, morbid ([place]) 07/01/2015  . Thrombocytopenia ([place])   . Acute ischemic VBA thalamic stroke ([place]) 01/18/2015  . Acute kidney injury superimposed on CKD ([place])   . Dysarthria   . Lethargy   . Labile blood pressure   . HLD (hyperlipidemia)   . Chronic obstructive pulmonary disease ([place])   . Hemiparesis, aphasia, and dysphagia as late effect of cerebrovascular accident (CVA) ([place])   . Acute ischemic stroke ([place])   . Right hemiplegia ([place])   . Abdominal pain 06/20/2014  . OSA on CPAP 12/08/2010  . Morbid obesity ([place]) 12/08/2010  . CAD (coronary artery disease) 12/08/2010  . Uncontrolled type 2 DM with hyperosmolar nonketotic hyperglycemia ([place]) 12/01/2010    No current facility-administered medications on file prior to encounter.    Current Outpatient Prescriptions on File Prior to Encounter  Medication Sig Dispense Refill  . Oxycodone HCl 20 MG TABS Take 1 tablet (20 mg total) by mouth 3 (three) times daily as needed. 90 tablet 0  . polyethylene glycol powder (MIRALAX) powder Take 17 g by mouth daily. (Patient taking differently: Take 17 g by mouth as needed. ) 255 g 0  . amLODipine (NORVASC) 5 MG tablet Take 1 tablet (5 mg total) by mouth at bedtime. (Patient not taking: Reported on 08/04/2016) 30 tablet 11  . atorvastatin (LIPITOR) 80 MG tablet Take 1 tablet (80 mg total) by mouth daily at 6 PM. (Patient not taking: Reported on 08/04/2016)  30 tablet 0  . Diapers & Supplies MISC Please provide patient with adult diapers and pads 100 each 12  . LYRICA 50 MG capsule Take 1 capsule (50 mg total) by mouth daily. (Patient not taking: Reported on 08/04/2016) 60 capsule 3  . Oxycodone HCl 20 MG TABS Take 1 tablet (20  mg total) by mouth 3 (three) times daily as needed. (Patient not taking: Reported on 08/04/2016) 90 tablet 0  . Oxycodone HCl 20 MG TABS Take 1 tablet (20 mg total) by mouth 3 (three) times daily as needed. (Patient not taking: Reported on 08/04/2016) 90 tablet 0  . rivaroxaban (XARELTO) 10 MG TABS tablet Take 1 tablet (10 mg total) by mouth daily. (Patient not taking: Reported on 08/04/2016) 30 tablet 3  . sodium bicarbonate 650 MG tablet Take 1 tablet (650 mg total) by mouth daily. (Patient not taking: Reported on 08/04/2016) 30 tablet 0    Past Medical History:  Diagnosis Date  . Anemia   . CHF (congestive heart failure) ([place])   . Chronic back pain   . COPD (chronic obstructive pulmonary disease) ([place])   . Coronary artery disease   . Diabetes mellitus   . DJD (degenerative joint disease)   . Emphysema   . Hypercholesteremia   . Hypertension   . Myocardial infarction ([place])   . Obesity   . Obesity hypoventilation syndrome ([place])   . Renal insufficiency   . Scoliosis   . Sleep apnea   . Stroke ([place])   . TIA (transient ischemic attack)     Past Surgical History:  Procedure Laterality Date  . COLON SURGERY    . CORONARY ANGIOPLASTY WITH STENT PLACEMENT      Social History  Substance Use Topics  . Smoking status: Never Smoker  . Smokeless tobacco: Never Used  . Alcohol use No    Family History  Problem Relation Age of Onset  . Diabetes Mother   . Stroke Maternal Aunt     PE: Vitals:   08/04/16 1403 08/04/16 1413 08/04/16 2030  BP:  (!) 200/107 (!) 194/104  Pulse:  75 88  Resp:  20 20  Temp:  98.5 F (36.9 C)   TempSrc:  Oral   SpO2:  100% 98%  Weight: 81.6 kg (180 lb)    Height: 5\' 5"  (1.651 m)     Patient Somnolent and nonverbal Atraumatic normocephalic head No cervical or supraclavicular lymphadenopathy appreciated No increased work of breathing, no audible wheezes/rhonchi Regular sinus rhythm/rate Abdomen is soft, nontender Lower extremities are symmetric  without appreciable edema Foley catheter is draining sediment his urine that does not appear to be grossly bloody No identifiable skin lesions   Recent Labs  08/04/16 1404 08/04/16 1416  WBC 11.1*  --   HGB 10.2* 9.5*  HCT 30.8* 28.0*    Recent Labs  08/04/16 1404 08/04/16 1416  NA 142 143  K 4.5 4.5  CL 114* 113*  CO2 18*  --   GLUCOSE 162* 159*  BUN 40* 49*  CREATININE 2.24* 2.20*  CALCIUM 9.1  --     Recent Labs  08/04/16 1404  INR 1.08   No results for input(s): LABURIN in the last 72 hours. Results for orders placed or performed during the hospital encounter of 02/14/16  Urine culture     Status: Abnormal   Collection Time: 02/14/16 10:20 AM  Result Value Ref Range Status   Specimen Description URINE, RANDOM  Final   Special Requests NONE  Final  Culture MULTIPLE SPECIES PRESENT, SUGGEST RECOLLECTION (A)  Final   Report Status 02/15/2016 FINAL  Final    Imaging: I've independently reviewed the patient's CT scan demonstrating bilateral nonobstructing stones as well as a left proximal ureteral stone with hydroureteronephrosis and perinephric stranding as well as a distal right ureteral stone with mild hydronephrosis.  Imp/Recommendations: The patient has altered mental status, nausea and vomiting, and bilateral ureteral obstruction. Fortunately her white blood cell count is not elevated and she is afebrile. She has a chronic indwelling Foley, and concern for quick progression to sepsis. Given her clinical situation, I recommended proceeding to the OR urgently for source control and placement of bilateral ureteral stents. I spoke with the family in significant detail explaining the rationale as well as the procedure itself. All family members present agreed, the DPOA signed the consent form.    Louis Meckel W

## 2016-08-04 NOTE — Anesthesia Preprocedure Evaluation (Addendum)
Anesthesia Evaluation  Patient identified by MRN, date of birth, ID band Patient confused    Reviewed: Allergy & Precautions, NPO status , Patient's Chart, lab work & pertinent test results  Airway Mallampati: II  TM Distance: >3 FB Neck ROM: Full    Dental  (+) Dental Advisory Given   Pulmonary sleep apnea , COPD,    breath sounds clear to auscultation       Cardiovascular hypertension, Pt. on medications + CAD, + Past MI, + Peripheral Vascular Disease and +CHF   Rhythm:Regular Rate:Normal     Neuro/Psych CVA    GI/Hepatic negative GI ROS, Neg liver ROS,   Endo/Other  diabetes  Renal/GU CRF and ARFRenal diseaseBilateral obstructing ureteral stones.     Musculoskeletal  (+) Arthritis ,   Abdominal   Peds  Hematology  (+) anemia ,   Anesthesia Other Findings   Reproductive/Obstetrics                             Lab Results  Component Value Date   WBC 11.1 (H) 08/04/2016   HGB 9.5 (L) 08/04/2016   HCT 28.0 (L) 08/04/2016   MCV 92.2 08/04/2016   PLT 219 08/04/2016   Lab Results  Component Value Date   CREATININE 2.20 (H) 08/04/2016   BUN 49 (H) 08/04/2016   NA 143 08/04/2016   K 4.5 08/04/2016   CL 113 (H) 08/04/2016   CO2 18 (L) 08/04/2016    Anesthesia Physical Anesthesia Plan  ASA: IV and emergent  Anesthesia Plan: General   Post-op Pain Management:    Induction: Intravenous and Rapid sequence  PONV Risk Score and Plan: 4 or greater and Ondansetron, Dexamethasone, Propofol and Treatment may vary due to age or medical condition  Airway Management Planned: Oral ETT  Additional Equipment:   Intra-op Plan:   Post-operative Plan: Extubation in OR  Informed Consent: I have reviewed the patients History and Physical, chart, labs and discussed the procedure including the risks, benefits and alternatives for the proposed anesthesia with the patient or authorized  representative who has indicated his/her understanding and acceptance.   Dental advisory given  Plan Discussed with:   Anesthesia Plan Comments:        Anesthesia Quick Evaluation

## 2016-08-04 NOTE — Transfer of Care (Signed)
Immediate Anesthesia Transfer of Care Note  Patient: Michelle Holmes  Procedure(s) Performed: Procedure(s): CYSTOSCOPY WITH STENT PLACEMENT bilateral bilateral retrograde fecal disimpaction (Bilateral)  Patient Location: PACU  Anesthesia Type:General  Level of Consciousness:  sedated, patient cooperative and responds to stimulation  Airway & Oxygen Therapy:Patient Spontanous Breathing and Patient connected to face mask oxgen  Post-op Assessment:  Report given to PACU RN and Post -op Vital signs reviewed and stable  Post vital signs:  Reviewed and stable  Last Vitals:  Vitals:   08/04/16 1413 08/04/16 2030  BP: (!) 200/107 (!) 194/104  Pulse: 75 88  Resp: 20 20  Temp: 79.4 C     Complications: No apparent anesthesia complications

## 2016-08-04 NOTE — ED Triage Notes (Signed)
Pt. Coming from home via [facility] for stroke-like symptoms. Pt. Last seen normal by nursing aid at [facility] last night. Pt. Found by grandson this morning to be more altered than normal, worsening right facial droop. Pt. Hx of stroke with right-sided deficits, but family says everything worse. Pt. Usually alert and able to carry on conversation. Pt. Alert to person only. Pt. C/o headache and abdomen pain. Pt. BP 206/94. Pt. Has foley cath.EDP notified.

## 2016-08-04 NOTE — Progress Notes (Signed)
The patient did develop a low-grade fever upon induction of anesthesia. She also had some emesis. She remained somnolent. She tolerated the procedure otherwise relatively well.  Bilateral ureteral stents were placed. I sent a urine culture from the right renal pelvis.  I attempted to disimpact the patient in the OR under anesthesia, but was unable to remove any stool, her stool is not well formed and was not amenable to disimpaction.  Given the patient's altered mental status and impending sepsis, decision was made to transfer the patient to stepdown for further and more intensive nursing care then the floor.

## 2016-08-04 NOTE — ED Notes (Signed)
Changed foley catheter per Dr. Billy Fischer.  48F 5cc taken out and replaced.

## 2016-08-04 NOTE — ED Provider Notes (Signed)
Patient was transferred from [facility] ED to be admitted for hydronephrosis/infected kidney stones. Dr. Louis Meckel with urology is to take the patient to the operating room for bilateral ureteral stents placed. Of note she is an internal medicine teaching service patient however the urologist has requested that she be transferred to this facility for the procedure. I have consulted Dr.Niu with hospital service she will admit the patient for further treatment. I did go ahead and start the patient on antibiotics. She has a penicillin allergy and I can't verify what kind of reaction she had so she was given Cipro. Blood cultures were ordered. A urine culture has already been started. She has no hypotension. No tachycardia. No fever. No other suggestions of sepsis.   Malvin Johns, MD 08/04/16 2150

## 2016-08-04 NOTE — Op Note (Signed)
Preoperative diagnosis:  1. Bilateral obstructing Holmes 2.  Impending sepsis 3. Severe constipation 4. Altered mental status  Postoperative diagnosis:  1.  As above   Procedure:  1. Cystoscopy 2. bilateral ureteral stent placement 3. bilateral retrograde pyelography with interpretation   Surgeon: Ardis Hughs, MD  Anesthesia: General  Complications: None  Intraoperative findings:  #1: The patient's right retrograde pyelogram demonstrated a large distal obstructing stone with severe hydroureteronephrosis. There were 2 additional filling defects within the right renal pelvis consistent with the patient's known nonobstructing Holmes. #2: The patient's left retrograde pyelogram to initiated a more proximal filling defect with severe hydroureteronephrosis, a medial deviated ureter and blunted calyces.  EBL: Minimal   Stents: 24 cm times 6 French double-J stents placed bilaterally   Specimens:  urine culture from the right renal pelvis.  Indication: [name] is a 66 y.o. patient Michelle Holmes, altered mental status, nausea/vomiting, and impending urosepsis. After reviewing the management options for treatment, he elected to proceed with the above surgical procedure(s). We have discussed the potential benefits and risks of the procedure, side effects of the proposed treatment, the likelihood of the patient achieving the goals of the procedure, and any potential problems that might occur during the procedure or recuperation. Informed consent has been obtained.  Description of procedure:  The patient was taken to the operating room and general anesthesia was induced.  The patient was placed in the dorsal lithotomy position, prepped and draped in the usual sterile fashion, and preoperative antibiotics were administered. A preoperative time-out was performed.   Cystourethroscopy was performed.  The patient's urethra was examined and was normal. The bladder was  prolapsed on the right side but the ureteral orifice were orthotopic. There were some Holmes within the bladder. There was some trabeculation as well. There were some Foley-related changes at the trigone.     Attention then turned to the leftureteral orifice and a ureteral catheter was used to intubate the ureteral orifice.  Omnipaque contrast was injected through the ureteral catheter and a retrograde pyelogram was performed with findings as dictated above.  A 0.38 sensor guidewire was then advanced up the left ureter into the renal pelvis under fluoroscopic guidance.  The wire was then backloaded through the cystoscope and a ureteral stent was advance over the wire using Seldinger technique.  The stent was positioned appropriately under fluoroscopic and cystoscopic guidance.  The wire was then removed with an adequate stent curl noted in the renal pelvis as well as in the bladder.  Same procedure was then performed on the patient's right side, this was significantly more difficult given the high-grade obstruction and the large stone associated with it. However, ultimately I was able to get 6 French double-J ureteral stent beyond the obstructing Holmes.   The bladder was then emptied and the procedure ended.  The patient appeared to tolerate the procedure well and without complications.  The patient was able to be awakened and transferred to the recovery unit in satisfactory condition.    Ardis Hughs, M.D.

## 2016-08-04 NOTE — ED Notes (Signed)
Hydralazine arrived from pharmacy just after patient was taken to OR by Dr. Louis Meckel. Writer notified Seth Bake, RN who stated she would notify the anaesthesiologist.  Patient belongings sent with family.

## 2016-08-04 NOTE — ED Notes (Signed)
Patient transported to CT 

## 2016-08-04 NOTE — ED Notes (Signed)
Called report to Coralyn Mark, charge, [facility] will take to room 18

## 2016-08-04 NOTE — Anesthesia Procedure Notes (Signed)
Procedure Name: Intubation Date/Time: 08/04/2016 10:48 PM Performed by: Claudia Desanctis Pre-anesthesia Checklist: Patient identified, Emergency Drugs available, Suction available and Patient being monitored Patient Re-evaluated:Patient Re-evaluated prior to inductionOxygen Delivery Method: Circle system utilized Preoxygenation: Pre-oxygenation with 100% oxygen Intubation Type: IV induction, Rapid sequence and Cricoid Pressure applied Laryngoscope Size: 2 and Miller Grade View: Grade I Tube type: Oral Tube size: 7.0 mm Number of attempts: 1 Airway Equipment and Method: Stylet Placement Confirmation: ETT inserted through vocal cords under direct vision,  positive ETCO2 and breath sounds checked- equal and bilateral Secured at: 22 cm Tube secured with: Tape Dental Injury: Teeth and Oropharynx as per pre-operative assessment

## 2016-08-04 NOTE — Progress Notes (Signed)
Pharmacy Antibiotic and Anticoagulation Note  Michelle Holmes is a 66 y.o. female admitted on 08/04/2016 with UTI.  Pharmacy has been consulted for azactam and vancomyin dosing.  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 (Start 2 hours after urological stent placement)  Plan: Azactam 1 gm IV q8h Vancomycin 1 Gm IV q24h VT=15-20 mg/L Heparin drip at 0130 7/11 at 1000 units/hr (no bolus) Check 1st HL at 1030 am 7/11 Daily CBC/HL F/u cultures/Scr/levels as indicated  Height: 5\' 5"  (165.1 cm) Weight: 180 lb (81.6 kg) IBW/kg (Calculated) : 57  Temp (24hrs), Avg:98.5 F (36.9 C), Min:98.5 F (36.9 C), Max:98.5 F (36.9 C)   Recent Labs Lab 08/04/16 1404 08/04/16 1416 08/04/16 2204  WBC 11.1*  --   --   CREATININE 2.24* 2.20*  --   LATICACIDVEN  --   --  1.73    Estimated Creatinine Clearance: 26.9 mL/min (A) (by C-G formula based on SCr of 2.2 mg/dL (H)).    Allergies  Allergen Reactions  . Penicillins Hives and Swelling    Has patient had a PCN reaction causing immediate rash, facial/tongue/throat swelling, SOB or lightheadedness with hypotension: yes Has patient had a PCN reaction causing severe rash involving mucus membranes or skin necrosis: no Has patient had a PCN reaction that required hospitalization no Has patient had a PCN reaction occurring within the last 10 years: no If all of the above answers are "NO", then may proceed with Cephalosporin use.     Antimicrobials this admission: 7/10 cipro >> x1 7/11 azactam >>  7/11 vancomycin >>  Dose adjustments this admission:   Microbiology results:  BCx:   UCx:    Sputum:    MRSA PCR:   Thank you for allowing pharmacy to be a part of this patient's care.  Dorrene German 08/04/2016 10:46 PM

## 2016-08-04 NOTE — ED Notes (Signed)
Patient arrived, getting CHG bath at this time.

## 2016-08-04 NOTE — H&P (Addendum)
History and Physical    Michelle Holmes RSW:[id number] DOB: 25-May-1950 DOA: 08/04/2016  Referring MD/NP/PA:   PCP: Lorella Nimrod, MD   Patient coming from:  The patient is coming from home.  At baseline, pt is partially independent for most of ADL.   Chief Complaint: AMS, abdominal pain, nausea or vomiting.  HPI: Michelle Holmes is a 66 y.o. female with medical history significant of hypertension, hyperlipidemia, diabetes mellitus, COPD, stroke with right-sided weakness, DVT on Xarelto, dCHF, OSA on CPAP, CKD-4, CAD, s/p of stent placement, anemia, medication noncompliance, who presents with altered mental status, abdominal pain, nausea and vomiting.  Per pt's daughters, patient has not been taking any medications recently. Patient became confused since last night. She complains of abdominal pain, which is located in bilateral lower abdomen. It is constant, severe. Patient has nausea and vomited more than 10 times. No diarrhea, fever or chills. Patient denies symptoms of UTI per daughter. Patient has history of stroke with right-sided weakness, but no new unilateral weakness, vision change or slurred speech. Patient's right face seems to be mildly swollen per her daughter. Patient had chest pain earlier, which has resolved. No SOB or cough.   ED Course: pt was found to have  WBC 11.1, lactic acid 2.4, INR 1.08, PTT 32, positive urinalysis with large amount of leukocytes, stable renal function, blood pressure elevated at 200/107-->190/104, temperature normal, no tachycardia, oxygen saturation 98% on room air. negative chest x-ray. CT head showed old left frontal infarction, no acute intracranial abnormalities. Pt is admitted to stepdown as inpatient. Urology, Dr. Louis Meckel was consulted.   Pt is IMTS pt. Since pt need ureteral stent placement, admitted to [facility] per urology's request.  CT abdomen/pelvis showed:  1. Obstructing calculi in both renal collecting systems. On the left there is a 9 x 5 mm stone at  the ureteropelvic junction with mild hydronephrosis. On the right there is an obstructing 3 x 4 mm stone at the ureterovesicular junction with moderate hydronephrosis and perinephric edema. 2. Large volume of stool in the rectosigmoid colon with rectal distention, rectal wall thickening and perirectal edema, suggesting fecal impaction. Colonic diverticulosis without diverticulitis. 3. Small hiatal hernia. 4. Aortic atherosclerosis.  Review of Systems:  could not be reviewed due to altered mental status.  Allergy:  Allergies  Allergen Reactions  . Penicillins Hives and Swelling    Has patient had a PCN reaction causing immediate rash, facial/tongue/throat swelling, SOB or lightheadedness with hypotension: yes Has patient had a PCN reaction causing severe rash involving mucus membranes or skin necrosis: no Has patient had a PCN reaction that required hospitalization no Has patient had a PCN reaction occurring within the last 10 years: no If all of the above answers are "NO", then may proceed with Cephalosporin use.     Past Medical History:  Diagnosis Date  . Anemia   . CHF (congestive heart failure) ([place])   . Chronic back pain   . COPD (chronic obstructive pulmonary disease) ([place])   . Coronary artery disease   . Diabetes mellitus   . DJD (degenerative joint disease)   . Emphysema   . Hypercholesteremia   . Hypertension   . Myocardial infarction ([place])   . Obesity   . Obesity hypoventilation syndrome ([place])   . Renal insufficiency   . Scoliosis   . Sleep apnea   . Stroke ([place])   . TIA (transient ischemic attack)     Past Surgical History:  Procedure Laterality Date  . COLON  SURGERY    . CORONARY ANGIOPLASTY WITH STENT PLACEMENT      Social History:  reports that she has never smoked. She has never used smokeless tobacco. She reports that she does not drink alcohol or use drugs.  Family History:  Family History  Problem Relation Age of Onset  . Diabetes Mother   .  Stroke Maternal Aunt      Prior to Admission medications   Medication Sig Start Date End Date Taking? Authorizing Provider  insulin aspart (NOVOLOG) 100 UNIT/ML injection Inject 10 Units into the skin as needed for high blood sugar.   Yes [provider]  Oxycodone HCl 20 MG TABS Take 1 tablet (20 mg total) by mouth 3 (three) times daily as needed. 07/08/16  Yes Lorella Nimrod, MD  polyethylene glycol powder (MIRALAX) powder Take 17 g by mouth daily. Patient taking differently: Take 17 g by mouth as needed.  01/15/16  Yes Jola Schmidt, MD  amLODipine (NORVASC) 5 MG tablet Take 1 tablet (5 mg total) by mouth at bedtime. Patient not taking: Reported on 08/04/2016 03/05/16   Florinda Marker, MD  atorvastatin (LIPITOR) 80 MG tablet Take 1 tablet (80 mg total) by mouth daily at 6 PM. Patient not taking: Reported on 08/04/2016 07/07/15   Arrien, Jimmy Picket, MD  [place] Please provide patient with adult diapers and pads 06/03/16   Ophelia Shoulder, MD  LYRICA 50 MG capsule Take 1 capsule (50 mg total) by mouth daily. Patient not taking: Reported on 08/04/2016 07/14/15   Elgergawy, Silver Huguenin, MD  Oxycodone HCl 20 MG TABS Take 1 tablet (20 mg total) by mouth 3 (three) times daily as needed. Patient not taking: Reported on 08/04/2016 07/08/16   Lorella Nimrod, MD  Oxycodone HCl 20 MG TABS Take 1 tablet (20 mg total) by mouth 3 (three) times daily as needed. Patient not taking: Reported on 08/04/2016 07/08/16   Lorella Nimrod, MD  rivaroxaban (XARELTO) 10 MG TABS tablet Take 1 tablet (10 mg total) by mouth daily. Patient not taking: Reported on 08/04/2016 04/03/16   Lorella Nimrod, MD  sodium bicarbonate 650 MG tablet Take 1 tablet (650 mg total) by mouth daily. Patient not taking: Reported on 08/04/2016 11/29/15   Florencia Reasons, MD    Physical Exam: Vitals:   08/05/16 0100 08/05/16 0205 08/05/16 0300 08/05/16 0400  BP: (!) 150/83 (!) 151/78 115/61 111/60  Pulse: 75 91 78 73  Resp: 11 17 16 16     Temp:    98.3 F (36.8 C)  TempSrc:    Oral  SpO2: 100% 100% 100% 100%  Weight:      Height:       General: Not in acute distress HEENT:       Eyes: PERRL, EOMI, no scleral icterus.       ENT: No discharge from the ears and nose, no pharynx injection, no tonsillar enlargement.        Neck: No JVD, no bruit, no mass felt. Heme: No neck lymph node enlargement. Cardiac: S1/S2, RRR, No murmurs, No gallops or rubs. Respiratory:  No rales, wheezing, rhonchi or rubs. GI: Soft, nondistended, has tenderness in lower abdomen, no rebound pain, no organomegaly, BS present. GU: No hematuria Ext: No pitting leg edema bilaterally. 2+DP/PT pulse bilaterally. Musculoskeletal: No joint deformities, No joint redness or warmth, no limitation of ROM in spin. Skin: No rashes.  Neuro: not oriented X3, not following commands. cranial nerves II-XII grossly intact, has right sided weakness  Psych: Patient is not psychotic, no suicidal or hemocidal ideation.  Labs on Admission: I have personally reviewed following labs and imaging studies  CBC:  Recent Labs Lab 08/04/16 1404 08/04/16 1416 08/05/16 0319  WBC 11.1*  --  13.6*  NEUTROABS 9.9*  --   --   HGB 10.2* 9.5* 8.8*  HCT 30.8* 28.0* 26.0*  MCV 92.2  --  91.5  PLT 219  --  562   Basic Metabolic Panel:  Recent Labs Lab 08/04/16 1404 08/04/16 1416  NA 142 143  K 4.5 4.5  CL 114* 113*  CO2 18*  --   GLUCOSE 162* 159*  BUN 40* 49*  CREATININE 2.24* 2.20*  CALCIUM 9.1  --    GFR: Estimated Creatinine Clearance: 25.8 mL/min (A) (by C-G formula based on SCr of 2.2 mg/dL (H)). Liver Function Tests:  Recent Labs Lab 08/04/16 1404  AST 16  ALT 8*  ALKPHOS 53  BILITOT 0.8  PROT 6.4*  ALBUMIN 3.5   No results for input(s): LIPASE, AMYLASE in the last 168 hours. No results for input(s): AMMONIA in the last 168 hours. Coagulation Profile:  Recent Labs Lab 08/04/16 1404  INR 1.08   Cardiac Enzymes:  Recent Labs Lab  08/05/16 0054  TROPONINI <0.03   BNP (last 3 results) No results for input(s): PROBNP in the last 8760 hours. HbA1C: No results for input(s): HGBA1C in the last 72 hours. CBG:  Recent Labs Lab 08/04/16 1637 08/04/16 2335  GLUCAP 159* 165*   Lipid Profile: No results for input(s): CHOL, HDL, LDLCALC, TRIG, CHOLHDL, LDLDIRECT in the last 72 hours. Thyroid Function Tests: No results for input(s): TSH, T4TOTAL, FREET4, T3FREE, THYROIDAB in the last 72 hours. Anemia Panel: No results for input(s): VITAMINB12, FOLATE, FERRITIN, TIBC, IRON, RETICCTPCT in the last 72 hours. Urine analysis:    Component Value Date/Time   COLORURINE YELLOW 08/04/2016 1952   APPEARANCEUR CLOUDY (A) 08/04/2016 1952   LABSPEC 1.015 08/04/2016 1952   PHURINE 5.5 08/04/2016 1952   GLUCOSEU NEGATIVE 08/04/2016 1952   HGBUR LARGE (A) 08/04/2016 [place] NEGATIVE 08/04/2016 [place] NEGATIVE 08/04/2016 1952   PROTEINUR 30 (A) 08/04/2016 1952   UROBILINOGEN 0.2 06/20/2014 0808   NITRITE NEGATIVE 08/04/2016 1952   LEUKOCYTESUR LARGE (A) 08/04/2016 1952   Sepsis Labs: @LABRCNTIP (procalcitonin:4,lacticidven:4) ) Recent Results (from the past 240 hour(s))  MRSA PCR Screening     Status: None   Collection Time: 08/05/16 12:09 AM  Result Value Ref Range Status   MRSA by PCR NEGATIVE NEGATIVE Final    Comment:        The GeneXpert MRSA Assay (FDA approved for NASAL specimens only), is one component of a comprehensive MRSA colonization surveillance program. It is not intended to diagnose MRSA infection nor to guide or monitor treatment for MRSA infections.      Radiological Exams on Admission: Ct Abdomen Pelvis Wo Contrast  Result Date: 08/04/2016 CLINICAL DATA:  Diffuse abdominal pain.  Vomiting. EXAM: CT ABDOMEN AND PELVIS WITHOUT CONTRAST TECHNIQUE: Multidetector CT imaging of the abdomen and pelvis was performed following the standard protocol without IV contrast. Arms down  positioning due to contractures. COMPARISON:  CT 1198 FINDINGS: Lower chest: Dependent lower lobe atelectasis, right greater than left. Coronary artery calcifications versus stents. Hepatobiliary: No focal hepatic lesion allowing for lack contrast and streak artifact from arms down positioning. Clips in the gallbladder fossa postcholecystectomy. No biliary dilatation. Pancreas: No ductal dilatation or inflammation. Spleen: Normal in size  without focal abnormality. Adrenals/Urinary Tract: Low-density nodule on the right adrenal gland likely adenoma. Left adrenal gland is normal. Obstructing 3 mm stone in the distal right ureter with moderate proximal hydroureteronephrosis. Moderate perinephric edema. Multiple additional nonobstructing stones in the right kidney. There is a 9 x 5 mm stone in the left ureteropelvic junction with mild proximal hydronephrosis. At least 2 additional nonobstructing stones in the left kidney. More distal ureters decompressed. Urinary bladder is decompressed by Foley catheter linear hyperdensity in the region the right urinary trigone appears similar to prior. Stomach/Bowel: Large volume of stool in the rectosigmoid colon with rectal distention of 8.4 cm. Rectal wall thickening and perirectal edema. Sigmoid colon is tortuous. Moderate stool in the more proximal colon. Sigmoid colonic diverticulosis without diverticulitis. Enteric sutures in the ascending colon/ cecum. No small bowel dilatation. Stomach is decompressed. Small hiatal hernia. Probable duodenal diverticulum. Vascular/Lymphatic: Aortic atherosclerosis and tortuosity. Decreased sensitivity for adenopathy given lack contrast, no bulky intra-abdominal or pelvic adenopathy is seen. Reproductive: Uterus not visualize, likely surgically absent. No adnexal mass. Other: No ascites or free air. Multiple pelvic phleboliths. Scatter clips/coils in the mesentery. Musculoskeletal: Scoliosis and degenerative change in the spine. Heterotopic  calcification about the right hip. Scattered subcutaneous calcifications about both flanks likely dystrophic. IMPRESSION: 1. Obstructing calculi in both renal collecting systems. On the left there is a 9 x 5 mm stone at the ureteropelvic junction with mild hydronephrosis. On the right there is an obstructing 3 x 4 mm stone at the ureterovesicular junction with moderate hydronephrosis and perinephric edema. 2. Large volume of stool in the rectosigmoid colon with rectal distention, rectal wall thickening and perirectal edema, suggesting fecal impaction. Colonic diverticulosis without diverticulitis. 3. Small hiatal hernia. 4. Aortic atherosclerosis. Electronically Signed   By: Jeb Levering M.D.   On: 08/04/2016 18:30   Ct Head Wo Contrast  Result Date: 08/04/2016 CLINICAL DATA:  Right-sided facial droop and altered consciousness. History stroke with right-sided deficits. High blood pressure. EXAM: CT HEAD WITHOUT CONTRAST TECHNIQUE: Contiguous axial images were obtained from the base of the skull through the vertex without intravenous contrast. COMPARISON:  11/23/2015 FINDINGS: Brain: Age advanced cerebral atrophy. Moderate and possibly progressive low density in the periventricular white matter likely related to small vessel disease. Left basal ganglia lacunar infarcts are felt to be similar. Left frontal lobe cortically based infarct is not significantly changed. Vascular: Intracranial atherosclerosis. Skull: No significant soft tissue swelling.  No skull fracture. Sinuses/Orbits: Normal imaged portions of the orbits and globes. Left maxillary sinus mucous retention cyst or polyp. Clear mastoid air cells. Other: None. IMPRESSION: 1. Age advanced cerebral atrophy with small vessel ischemic change, possibly progressive since February. 2. Remote left frontal infarct. 3. No convincing evidence of acute superimposed process. Electronically Signed   By: Abigail Miyamoto M.D.   On: 08/04/2016 15:01   Dg Chest Port 1  View  Result Date: 08/04/2016 CLINICAL DATA:  Altered mental status with right-sided facial droop EXAM: PORTABLE CHEST 1 VIEW COMPARISON:  January 27, 2016 FINDINGS: There is no edema or consolidation. Heart is upper normal in size with pulmonary vascularity within normal limits. Aorta is tortuous. There is scoliosis in the thoracic region with multilevel degenerative type change. There is advanced arthropathy in each shoulder. IMPRESSION: No edema or consolidation. Stable cardiac silhouette. Advanced arthropathy in both shoulders. Electronically Signed   By: Lowella Grip III M.D.   On: 08/04/2016 15:22   Dg C-arm 1-60 Min-no Report  Result Date: 08/04/2016 Fluoroscopy  was utilized by the requesting physician.  No radiographic interpretation.    EKG: Independently reviewed.  Sinus rhythm, QTC 441, nonspecific T-wave change.  Assessment/Plan Principal Problem:   Complicated UTI (urinary tract infection) Active Problems:   OSA on CPAP   HLD (hyperlipidemia)   Acute metabolic encephalopathy   Dvt femoral (deep venous thrombosis) (H[facility])   Essential hypertension   Stroke (cerebrum) ([facility])   CKD (chronic kidney disease), stage IV ([facility])   Hypertensive urgency   Kidney stones   Sepsis due to complicated UTI 2.2 to bilateral obstructing calculi both renal collecting system: CT scan showed bilateral obstructing stones as well as a left proximal ureteral stone with hydroureteronephrosis and perinephric stranding as well as a distal right ureteral stone with mild hydronephrosis. Patient meets criteria for sepsis with leukocytosis and elevated lactic acid is 2.4, currently hemodynamically stable. Urology, Dr. Louis Meckel was consulted, will take patient to OR for ureteral stent placement.   - Admit to SDU as inpt -  IV vancomycin and aztreonam (patient received one dose of cipro in ED) - Follow up results of urine and blood cx and amend antibiotic regimen if needed per sensitivity results - prn  Zofran for nausea - will get Procalcitonin and trend lactic acid levels per sepsis protocol. - IVF: 2.5L of NS bolus in ED, followed by 100 cc/h -f/u urology further recommendations  Acute metabolic encephalopathy: most likely due to UTI and sepsis -treat UTI as above  -Frequent neuro check -Hold oral medications  Hypertensive urgency: due to medication noncompliance -IV hydralazine when necessary  -hold Amlodipine due to altered mental status   DM-II: Last A1c 5.5 on 07/08/16, well controled. Patient is taking novolog at home -SSI -Check A1c  HLD: -hold lipitor due to AMS  OSA on CPAP -hold CPAP due to AMS  DVT: pt is not taking Xarelto at home -IV heparin per pharm (this is discussed with urologist. Per Dr. Louis Meckel, it is okay to start IV heparin immediately after ureteral stent is placed)  Stroke (cerebrum) ([place]): -Hold lipitor due to AMS  CKD (chronic kidney disease), stage IV ([place]): stable. Baseline creatinine 2.2-2.5. Patient's creatinine is at 2.24, BUN 40. -Follow up renal function by BMP     DVT ppx: will be on IV Heparin after procedure Code Status: Full code Family Communication: Yes, patient's 2 daughters and son-in law at bed side Disposition Plan:  Anticipate discharge back to previous home environment Consults called:  Urology, Dr. Louis Meckel Admission status: SDU/inpation       Date of Service 08/05/2016    Ivor Costa [facility] Pager [phone]  If 7PM-7AM, please contact night-coverage [email or web address] Password [facility] 08/05/2016, 5:26 AM

## 2016-08-04 NOTE — ED Provider Notes (Signed)
[facility] DEPT Provider Note   CSN: [id number] Arrival date & time: 08/04/16  1351     History   Chief Complaint Chief Complaint  Patient presents with  . Altered Mental Status    HPI Michelle Holmes is a 66 y.o. female.  66 year old presents with nonbilious emesis as well as right-sided facial droop. 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 Last seen normal was 7:30 PM yesterday. No reported fever or cough or congestion. Patient is not ambulatory. Does have indwelling Foley catheter. No recent history of trauma. EMS called and patient transported here      Past Medical History:  Diagnosis Date  . Anemia   . CHF (congestive heart failure) ([place])   . Chronic back pain   . COPD (chronic obstructive pulmonary disease) ([place])   . Coronary artery disease   . Diabetes mellitus   . DJD (degenerative joint disease)   . Emphysema   . Hypercholesteremia   . Hypertension   . Myocardial infarction ([place])   . Obesity   . Obesity hypoventilation syndrome ([place])   . Renal insufficiency   . Scoliosis   . Sleep apnea   . Stroke ([place])   . TIA (transient ischemic attack)     Patient Active Problem List   Diagnosis Date Noted  . Long-term current use of opiate analgesic 03/05/2016  . Essential hypertension 03/05/2016  . Dvt femoral (deep venous thrombosis) ([place]) 02/17/2016  . Chronic diastolic CHF (congestive heart failure) ([place]) 01/28/2016  . Dehydration 01/28/2016  . Protein-calorie malnutrition, severe 01/28/2016  . Emesis   . Goals of care, counseling/discussion   . Palliative care by specialist   . Anemia in other chronic diseases classified elsewhere   . Acute urinary retention   . Pressure injury of skin 11/24/2015  . Chronic kidney disease (CKD), stage IV (severe) ([place]) 07/06/2015  . Acute CVA (cerebrovascular accident) ([place])   . Sinus bradycardia 07/04/2015  . Parietal lobe infarction  ([place]) 07/04/2015  . Spastic hemiplegia affecting nondominant side ([place]) 07/01/2015  . Obesity, morbid ([place]) 07/01/2015  . Thrombocytopenia ([place])   . Acute ischemic VBA thalamic stroke ([place]) 01/18/2015  . Acute kidney injury superimposed on CKD ([place])   . Dysarthria   . Lethargy   . Labile blood pressure   . Hyperlipidemia   . Chronic obstructive pulmonary disease ([place])   . Hemiparesis, aphasia, and dysphagia as late effect of cerebrovascular accident (CVA) ([place])   . Acute ischemic stroke ([place])   . Right hemiplegia ([place])   . Abdominal pain 06/20/2014  . OSA on CPAP 12/08/2010  . Morbid obesity ([place]) 12/08/2010  . CAD (coronary artery disease) 12/08/2010  . Uncontrolled type 2 DM with hyperosmolar nonketotic hyperglycemia ([place]) 12/01/2010    Past Surgical History:  Procedure Laterality Date  . COLON SURGERY    . CORONARY ANGIOPLASTY WITH STENT PLACEMENT      OB History    No data available       Home Medications    Prior to Admission medications   Medication Sig Start Date End Date Taking? Authorizing Provider  amLODipine (NORVASC) 5 MG tablet Take 1 tablet (5 mg total) by mouth at bedtime. 03/05/16   Burns, Arloa Koh, MD  atorvastatin (LIPITOR) 80 MG tablet Take 1 tablet (80 mg total) by mouth daily at 6 PM. 07/07/15   Arrien, Jimmy Picket, MD  Diapers & Supplies MISC Please provide  patient with adult diapers and pads 06/03/16   Ophelia Shoulder, MD  LYRICA 50 MG capsule Take 1 capsule (50 mg total) by mouth daily. 07/14/15   Elgergawy, Silver Huguenin, MD  Oxycodone HCl 20 MG TABS Take 1 tablet (20 mg total) by mouth 3 (three) times daily as needed. 07/08/16   Lorella Nimrod, MD  Oxycodone HCl 20 MG TABS Take 1 tablet (20 mg total) by mouth 3 (three) times daily as needed. 07/08/16   Lorella Nimrod, MD  Oxycodone HCl 20 MG TABS Take 1 tablet (20 mg total) by mouth 3 (three) times daily as needed. 07/08/16   Lorella Nimrod, MD  polyethylene glycol powder (MIRALAX) powder Take 17 g by mouth  daily. 01/15/16   Jola Schmidt, MD  rivaroxaban (XARELTO) 10 MG TABS tablet Take 1 tablet (10 mg total) by mouth daily. 04/03/16   Lorella Nimrod, MD  sodium bicarbonate 650 MG tablet Take 1 tablet (650 mg total) by mouth daily. 11/29/15   Florencia Reasons, MD    Family History Family History  Problem Relation Age of Onset  . Diabetes Mother   . Stroke Maternal Aunt     Social History Social History  Substance Use Topics  . Smoking status: Never Smoker  . Smokeless tobacco: Never Used  . Alcohol use No     Allergies   Penicillins   Review of Systems Review of Systems  All other systems reviewed and are negative.    Physical Exam Updated Vital Signs BP (!) 200/107 (BP Location: Left Arm)   Pulse 75   Temp 98.5 F (36.9 C) (Oral)   Resp 20   Ht 1.651 m (5\' 5" )   Wt 81.6 kg (180 lb)   SpO2 100%   BMI 29.95 kg/m   Physical Exam  Constitutional: She is oriented to person, place, and time. She appears well-developed and well-nourished. She appears lethargic.  Non-toxic appearance. No distress.  HENT:  Head: Normocephalic and atraumatic.  Eyes: Conjunctivae, EOM and lids are normal. Pupils are equal, round, and reactive to light.  Neck: Normal range of motion. Neck supple. No tracheal deviation present. No thyroid mass present.  Cardiovascular: Normal rate, regular rhythm and normal heart sounds.  Exam reveals no gallop.   No murmur heard. Pulmonary/Chest: Effort normal and breath sounds normal. No stridor. No respiratory distress. She has no decreased breath sounds. She has no wheezes. She has no rhonchi. She has no rales.  Abdominal: Soft. Normal appearance and bowel sounds are normal. She exhibits no distension. There is no tenderness. There is no rigidity, no rebound, no guarding and no CVA tenderness.  Musculoskeletal: Normal range of motion. She exhibits no edema or tenderness.  Neurological: She is oriented to person, place, and time. She appears lethargic. She displays  atrophy. No cranial nerve deficit or sensory deficit. GCS eye subscore is 4. GCS verbal subscore is 5. GCS motor subscore is 6.  Baseline deficits at right upper right lower extremity unchanged  Skin: Skin is warm and dry. No abrasion and no rash noted.  Psychiatric: Her affect is blunt. Her speech is delayed. She is withdrawn.  Nursing note and vitals reviewed.    ED Treatments / Results  Labs (all labs ordered are listed, but only abnormal results are displayed) Labs Reviewed  I-STAT CHEM 8, ED - Abnormal; Notable for the following:       Result Value   Chloride 113 (*)    BUN 49 (*)    Creatinine, Ser 2.20 (*)  Glucose, Bld 159 (*)    Hemoglobin 9.5 (*)    HCT 28.0 (*)    All other components within normal limits  URINE CULTURE  PROTIME-INR  APTT  CBC  DIFFERENTIAL  COMPREHENSIVE METABOLIC PANEL  URINALYSIS, ROUTINE W REFLEX MICROSCOPIC  LIPASE, BLOOD  I-STAT TROPOININ, ED  CBG MONITORING, ED    EKG  EKG Interpretation  Date/Time:  Tuesday August 04 2016 14:13:16 EDT Ventricular Rate:  75 PR Interval:    QRS Duration: 76 QT Interval:  394 QTC Calculation: 441 R Axis:   -7 Text Interpretation:  Sinus rhythm No significant change since last tracing Confirmed by Lacretia Leigh ([id number]) on 08/04/2016 2:16:31 PM       Radiology No results found.  Procedures Procedures (including critical care time)  Medications Ordered in ED Medications - No data to display   Initial Impression / Assessment and Plan / ED Course  I have reviewed the triage vital signs and the nursing notes.  Pertinent labs & imaging results that were available during my care of the patient were reviewed by me and considered in my medical decision making (see chart for details).     Patient had been experiencing emesis prior to arrival here. Family states that she is more lethargic than at her baseline. Head CT without acute findings. Patient's abdominal exam difficult due to her baseline  dementia. Due to her persistent vomiting, had medicated with Zofran and will order abdominal CT. Care signed out to next provider  Final Clinical Impressions(s) / ED Diagnoses   Final diagnoses:  None    New Prescriptions New Prescriptions   No medications on file     Lacretia Leigh, MD 08/04/16 1537

## 2016-08-04 NOTE — Consult Note (Signed)
I have discussed this patient's care with Dr. Billy Fischer and reviewed her CAT scan. She has bilateral obstructing stones. Given her history of recurrent infections, I'm concerned that this may quickly developed into sepsis. As such, I recommended transferring her to [facility] so that we can urgently place ureteral stents bilaterally. She should then be admitted to the medicine service.

## 2016-08-05 ENCOUNTER — Encounter (HOSPITAL_COMMUNITY): Payer: Self-pay | Admitting: Urology

## 2016-08-05 DIAGNOSIS — N200 Calculus of kidney: Secondary | ICD-10-CM

## 2016-08-05 DIAGNOSIS — G4733 Obstructive sleep apnea (adult) (pediatric): Secondary | ICD-10-CM

## 2016-08-05 DIAGNOSIS — I160 Hypertensive urgency: Secondary | ICD-10-CM

## 2016-08-05 DIAGNOSIS — R319 Hematuria, unspecified: Secondary | ICD-10-CM

## 2016-08-05 DIAGNOSIS — Z9989 Dependence on other enabling machines and devices: Secondary | ICD-10-CM

## 2016-08-05 DIAGNOSIS — I639 Cerebral infarction, unspecified: Secondary | ICD-10-CM

## 2016-08-05 DIAGNOSIS — I10 Essential (primary) hypertension: Secondary | ICD-10-CM

## 2016-08-05 DIAGNOSIS — R410 Disorientation, unspecified: Secondary | ICD-10-CM

## 2016-08-05 DIAGNOSIS — N390 Urinary tract infection, site not specified: Secondary | ICD-10-CM

## 2016-08-05 DIAGNOSIS — N184 Chronic kidney disease, stage 4 (severe): Secondary | ICD-10-CM

## 2016-08-05 LAB — BLOOD CULTURE ID PANEL (REFLEXED)

## 2016-08-05 LAB — GLUCOSE, CAPILLARY
Glucose-Capillary: 175 mg/dL — ABNORMAL HIGH (ref 65–99)
Glucose-Capillary: 185 mg/dL — ABNORMAL HIGH (ref 65–99)
Glucose-Capillary: 138 mg/dL — ABNORMAL HIGH (ref 65–99)
Glucose-Capillary: 127 mg/dL — ABNORMAL HIGH (ref 65–99)
Glucose-Capillary: 122 mg/dL — ABNORMAL HIGH (ref 65–99)

## 2016-08-05 LAB — BASIC METABOLIC PANEL
Anion gap: 11 (ref 5–15)
BUN: 41 mg/dL — ABNORMAL HIGH (ref 6–20)
CO2: 16 mmol/L — ABNORMAL LOW (ref 22–32)
Calcium: 7.6 mg/dL — ABNORMAL LOW (ref 8.9–10.3)
Chloride: 116 mmol/L — ABNORMAL HIGH (ref 101–111)
Creatinine, Ser: 2.22 mg/dL — ABNORMAL HIGH (ref 0.44–1.00)
GFR calc Af Amer: 26 mL/min — ABNORMAL LOW (ref >60)
GFR calc non Af Amer: 22 mL/min — ABNORMAL LOW (ref >60)
Glucose, Bld: 196 mg/dL — ABNORMAL HIGH (ref 65–99)
Potassium: 4 mmol/L (ref 3.5–5.1)
Sodium: 143 mmol/L (ref 135–145)

## 2016-08-05 LAB — CBC
HCT: 26 % — ABNORMAL LOW (ref 36.0–46.0)
Hemoglobin: 8.8 g/dL — ABNORMAL LOW (ref 12.0–15.0)
MCH: 31 pg (ref 26.0–34.0)
MCHC: 33.8 g/dL (ref 30.0–36.0)
MCV: 91.5 fL (ref 78.0–100.0)
Platelets: 180 10*3/uL (ref 150–400)
RBC: 2.84 MIL/uL — ABNORMAL LOW (ref 3.87–5.11)
RDW: 14.3 % (ref 11.5–15.5)
WBC: 13.6 10*3/uL — ABNORMAL HIGH (ref 4.0–10.5)

## 2016-08-05 LAB — BRAIN NATRIURETIC PEPTIDE: B Natriuretic Peptide: 110.5 pg/mL — ABNORMAL HIGH (ref 0.0–100.0)

## 2016-08-05 LAB — HIV ANTIBODY (ROUTINE TESTING W REFLEX): HIV Screen 4th Generation wRfx: NONREACTIVE

## 2016-08-05 LAB — TROPONIN I
Troponin I: <0.03 ng/mL (ref <0.03)
Troponin I: <0.03 ng/mL (ref <0.03)
Troponin I: <0.03 ng/mL (ref <0.03)

## 2016-08-05 LAB — PROCALCITONIN: Procalcitonin: 1.71 ng/mL

## 2016-08-05 LAB — HEPARIN LEVEL (UNFRACTIONATED)
Heparin Unfractionated: <0.1 IU/mL — ABNORMAL LOW (ref 0.30–0.70)
Heparin Unfractionated: 0.21 IU/mL — ABNORMAL LOW (ref 0.30–0.70)
Heparin Unfractionated: 0.42 IU/mL (ref 0.30–0.70)

## 2016-08-05 LAB — MRSA PCR SCREENING: MRSA by PCR: NEGATIVE

## 2016-08-05 LAB — LACTIC ACID, PLASMA
Lactic Acid, Venous: 2.4 mmol/L (ref 0.5–1.9)
Lactic Acid, Venous: 1.5 mmol/L (ref 0.5–1.9)
Lactic Acid, Venous: 1.6 mmol/L (ref 0.5–1.9)

## 2016-08-05 MED ORDER — SORBITOL 70 % SOLN
960.0000 mL | TOPICAL_OIL | Freq: Once | ORAL | Status: AC
  Administered 2016-08-05: 960 mL via RECTAL
  Filled 2016-08-05: qty 240

## 2016-08-05 MED ORDER — VANCOMYCIN HCL IN DEXTROSE 750-5 MG/150ML-% IV SOLN
750.0000 mg | INTRAVENOUS | Status: DC
  Filled 2016-08-05: qty 150

## 2016-08-05 MED ORDER — SODIUM BICARBONATE 650 MG PO TABS
650.0000 mg | ORAL_TABLET | Freq: Every day | ORAL | Status: DC
  Administered 2016-08-05 – 2016-08-07 (×3): 650 mg via ORAL
  Filled 2016-08-05 (×4): qty 1

## 2016-08-05 MED ORDER — AZTREONAM IN DEXTROSE 1 GM/50ML IV SOLN
1.0000 g | Freq: Three times a day (TID) | INTRAVENOUS | Status: DC
  Administered 2016-08-05: 1 g via INTRAVENOUS
  Filled 2016-08-05 (×2): qty 50

## 2016-08-05 MED ORDER — ONDANSETRON HCL 4 MG/2ML IJ SOLN
4.0000 mg | INTRAMUSCULAR | Status: DC | PRN
  Administered 2016-08-06: 4 mg via INTRAVENOUS
  Filled 2016-08-05: qty 2

## 2016-08-05 MED ORDER — DEXTROSE 5 % IV SOLN
1.0000 g | INTRAVENOUS | Status: DC
  Administered 2016-08-05: 1 g via INTRAVENOUS
  Filled 2016-08-05: qty 10

## 2016-08-05 MED ORDER — HEPARIN (PORCINE) IN NACL 100-0.45 UNIT/ML-% IJ SOLN
1000.0000 [IU]/h | INTRAMUSCULAR | Status: DC
  Administered 2016-08-05: 1000 [IU]/h via INTRAVENOUS
  Filled 2016-08-05: qty 250

## 2016-08-05 MED ORDER — HEPARIN (PORCINE) IN NACL 100-0.45 UNIT/ML-% IJ SOLN
1150.0000 [IU]/h | INTRAMUSCULAR | Status: DC
  Administered 2016-08-05 – 2016-08-06 (×2): 1150 [IU]/h via INTRAVENOUS
  Filled 2016-08-05 (×2): qty 250

## 2016-08-05 MED ORDER — VANCOMYCIN HCL IN DEXTROSE 1-5 GM/200ML-% IV SOLN
1000.0000 mg | INTRAVENOUS | Status: DC
  Administered 2016-08-05: 1000 mg via INTRAVENOUS
  Filled 2016-08-05: qty 200

## 2016-08-05 MED ORDER — ORAL CARE MOUTH RINSE
15.0000 mL | Freq: Two times a day (BID) | OROMUCOSAL | Status: DC
  Administered 2016-08-05 – 2016-08-07 (×6): 15 mL via OROMUCOSAL

## 2016-08-05 MED ORDER — SODIUM CHLORIDE 0.9 % IV BOLUS (SEPSIS)
2500.0000 mL | Freq: Once | INTRAVENOUS | Status: AC
  Administered 2016-08-05: 2500 mL via INTRAVENOUS

## 2016-08-05 MED ORDER — SODIUM CHLORIDE 0.9 % IV SOLN
INTRAVENOUS | Status: DC
  Administered 2016-08-06 – 2016-08-07 (×3): via INTRAVENOUS

## 2016-08-05 NOTE — Progress Notes (Signed)
CRITICAL VALUE ALERT  Critical Value:  Lactic Acid 2.4  Date & Time Notied:  08/05/2016 0130  Provider Notified: [facility]  Orders Received/Actions taken: Lactic Acid lab order placed. Will continue to monitor patient

## 2016-08-05 NOTE — Progress Notes (Signed)
ANTICOAGULATION CONSULT NOTE - Follow Up Consult  Pharmacy Consult for heparin Indication: hx DVT  Allergies  Allergen Reactions  . Penicillins Hives and Swelling    ++ tolerates ceftriaxone++  Has patient had a PCN reaction causing immediate rash, facial/tongue/throat swelling, SOB or lightheadedness with hypotension: yes Has patient had a PCN reaction causing severe rash involving mucus membranes or skin necrosis: no Has patient had a PCN reaction that required hospitalization no Has patient had a PCN reaction occurring within the last 10 years: no If all of the above answers are "NO", then may proceed with Cephalosporin use.     Patient Measurements: Height: 5\' 5"  (165.1 cm) Weight: 165 lb 5.5 oz (75 kg) IBW/kg (Calculated) : 57 Heparin Dosing Weight: 72 kg  Vital Signs: Temp: 98.9 F (37.2 C) (07/11 1959) Temp Source: Oral (07/11 1959) BP: 137/67 (07/11 1600) Pulse Rate: 73 (07/11 1600)  Labs:  Recent Labs  08/04/16 1404 08/04/16 1416 08/05/16 0054 08/05/16 0055 08/05/16 0319 08/05/16 0818 08/05/16 1029 08/05/16 1945  HGB 10.2* 9.5*  --   --  8.8*  --   --   --   HCT 30.8* 28.0*  --   --  26.0*  --   --   --   PLT 219  --   --   --  180  --   --   --   APTT 32  --   --   --   --   --   --   --   LABPROT 14.0  --   --   --   --   --   --   --   INR 1.08  --   --   --   --   --   --   --   HEPARINUNFRC  --   --   --  <0.10*  --   --  0.21* 0.42  CREATININE 2.24* 2.20*  --   --  2.22*  --   --   --   TROPONINI  --   --  <0.03  --   --  <0.03 <0.03  --     Estimated Creatinine Clearance: 25.6 mL/min (A) (by C-G formula based on SCr of 2.22 mg/dL (H)).   Medications:  xarelto 10mg  daily PTA (pt reported that the last dose she took was on 07/31/16- said that she has been forgetting to take it)  Assessment: Patient's a 66 y.o F  With hx DVT on xarelto PTA, presented to the ED on 08/04/16 with c/o AMS.  Abd CT showed bilateral obstructing stones-- s/p cystoscopy  with bilateral ureteral stent placement on 08/04/16.  Xarelto placed on hold since admission and pt was transitioned to heparin drip.  Today, 08/05/2016: - Heparin level therapeutic(=0.42) following rate increase to 1150 units/hr - hgb down slightly to 8.8, plt 180 - no bleeding documented   Goal of Therapy:  Heparin level 0.3-0.7 units/ml Monitor platelets by anticoagulation protocol: Yes   Plan:  - Continue heparin drip at 1150 units/hr - Check heparin level in morning - daily heparin level and CBC - monitor for s/s bleeding - f/u plan for resuming xarelto   Doreene Eland, PharmD, BCPS.   Pager: [phone] 08/05/2016 8:20 PM

## 2016-08-05 NOTE — Progress Notes (Signed)
Pharmacy Antibiotic and Anticoagulation Note  Michelle Holmes is a 66 y.o. female admitted on 08/04/2016 with UTI.  Pharmacy has been consulted for azactam and vancomyin dosing.  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 (Start 2 hours after urological stent placement).  Aztreonam was changed to ceftriaxone.  Blood cx now with GPC pairs with BCID = enterococcus (no VAN A/B gene detected).  History of hives and swelling to PCN.  Has CKD.  Change to vancomycin monotherapy per TRH and ID.  Plan:  Vancomycin 750mg  IV O24M  Check daily SCr for underlying CKD  Check trough at steady state  Height: 5\' 5"  (165.1 cm) Weight: 165 lb 5.5 oz (75 kg) IBW/kg (Calculated) : 57  Temp (24hrs), Avg:98.9 F (37.2 C), Min:98.3 F (36.8 C), Max:99.7 F (37.6 C)   Recent Labs Lab 08/04/16 1404 08/04/16 1416 08/04/16 2204 08/05/16 0054 08/05/16 0319 08/05/16 0818  WBC 11.1*  --   --   --  13.6*  --   CREATININE 2.24* 2.20*  --   --  2.22*  --   LATICACIDVEN  --   --  1.73 2.4* 1.5 1.6    Estimated Creatinine Clearance: 25.6 mL/min (A) (by C-G formula based on SCr of 2.22 mg/dL (H)).    Allergies  Allergen Reactions  . Penicillins Hives and Swelling    ++ tolerates ceftriaxone++  Has patient had a PCN reaction causing immediate rash, facial/tongue/throat swelling, SOB or lightheadedness with hypotension: yes Has patient had a PCN reaction causing severe rash involving mucus membranes or skin necrosis: no Has patient had a PCN reaction that required hospitalization no Has patient had a PCN reaction occurring within the last 10 years: no If all of the above answers are "NO", then may proceed with Cephalosporin use.    Antimicrobials this admission: 7/10 cipro >> x1 7/11 azactam >> 7/11 7/11 CTX>> 7/11 7/11 vancomycin >>   Dose adjustments this admission:  Microbiology results: 7/10 BCx x2: 2/2 GPC in chains (BCID enterococcus) 7/10 UCx:  7/10 MRSA PCR: neg 7/10 UA: large leuk, many bact  Thank  you for allowing pharmacy to be a part of this patient's care.  Doreene Eland, PharmD, BCPS.   Pager: [phone] 08/05/2016 3:46 PM

## 2016-08-05 NOTE — Progress Notes (Signed)
ANTICOAGULATION CONSULT NOTE - Follow Up Consult  Pharmacy Consult for heparin Indication: hx DVT  Allergies  Allergen Reactions  . Penicillins Hives and Swelling    ++ tolerates ceftriaxone++  Has patient had a PCN reaction causing immediate rash, facial/tongue/throat swelling, SOB or lightheadedness with hypotension: yes Has patient had a PCN reaction causing severe rash involving mucus membranes or skin necrosis: no Has patient had a PCN reaction that required hospitalization no Has patient had a PCN reaction occurring within the last 10 years: no If all of the above answers are "NO", then may proceed with Cephalosporin use.     Patient Measurements: Height: 5\' 5"  (165.1 cm) Weight: 165 lb 5.5 oz (75 kg) IBW/kg (Calculated) : 57 Heparin Dosing Weight: 72 kg  Vital Signs: Temp: 98.3 F (36.8 C) (07/11 0800) Temp Source: Oral (07/11 0800) BP: 149/79 (07/11 0800) Pulse Rate: 64 (07/11 0900)  Labs:  Recent Labs  08/04/16 1404 08/04/16 1416 08/05/16 0054 08/05/16 0055 08/05/16 0319 08/05/16 0818  HGB 10.2* 9.5*  --   --  8.8*  --   HCT 30.8* 28.0*  --   --  26.0*  --   PLT 219  --   --   --  180  --   APTT 32  --   --   --   --   --   LABPROT 14.0  --   --   --   --   --   INR 1.08  --   --   --   --   --   HEPARINUNFRC  --   --   --  <0.10*  --   --   CREATININE 2.24* 2.20*  --   --  2.22*  --   TROPONINI  --   --  <0.03  --   --  <0.03    Estimated Creatinine Clearance: 25.6 mL/min (A) (by C-G formula based on SCr of 2.22 mg/dL (H)).   Medications:  xarelto 10mg  daily PTA (pt reported that the last dose she took was on 07/31/16- said that she has been forgetting to take it)  Assessment: Patient's a 66 y.o F  With hx DVT on xarelto PTA, presented to the ED on 08/04/16 with c/o AMS.  Abd CT showed bilateral obstructing stones-- s/p cystoscopy with bilateral ureteral stent placement on 08/04/16.  Xarelto placed on hold since admission and pt was transitioned to  heparin drip.  Today, 08/05/2016: - first heparin level is sub-therapeutic at 0.21 - hgb down slightly to 8.8, plt 180 - no bleeding documented   Goal of Therapy:  Heparin level 0.3-0.7 units/ml Monitor platelets by anticoagulation protocol: Yes   Plan:  - increase heparin drip to 1150 units/hr - check 8 hr heparin level - monitor for s/s bleeding - f/u plan for resuming xarelto   Jozalynn Noyce P 08/05/2016,9:40 AM

## 2016-08-05 NOTE — Progress Notes (Signed)
PHARMACY - PHYSICIAN COMMUNICATION CRITICAL VALUE ALERT - BLOOD CULTURE IDENTIFICATION (BCID)  Results for orders placed or performed during the hospital encounter of 08/04/16  Blood Culture ID Panel (Reflexed) (Collected: 08/04/2016  9:53 PM)  Result Value Ref Range   Enterococcus species DETECTED (A) NOT DETECTED   Vancomycin resistance NOT DETECTED NOT DETECTED   Listeria monocytogenes NOT DETECTED NOT DETECTED   Staphylococcus species NOT DETECTED NOT DETECTED   Staphylococcus aureus NOT DETECTED NOT DETECTED   Streptococcus species NOT DETECTED NOT DETECTED   Streptococcus agalactiae NOT DETECTED NOT DETECTED   Streptococcus pneumoniae NOT DETECTED NOT DETECTED   Streptococcus pyogenes NOT DETECTED NOT DETECTED   Acinetobacter baumannii NOT DETECTED NOT DETECTED   Enterobacteriaceae species NOT DETECTED NOT DETECTED   Enterobacter cloacae complex NOT DETECTED NOT DETECTED   Escherichia coli NOT DETECTED NOT DETECTED   Klebsiella oxytoca NOT DETECTED NOT DETECTED   Klebsiella pneumoniae NOT DETECTED NOT DETECTED   Proteus species NOT DETECTED NOT DETECTED   Serratia marcescens NOT DETECTED NOT DETECTED   Haemophilus influenzae NOT DETECTED NOT DETECTED   Neisseria meningitidis NOT DETECTED NOT DETECTED   Pseudomonas aeruginosa NOT DETECTED NOT DETECTED   Candida albicans NOT DETECTED NOT DETECTED   Candida glabrata NOT DETECTED NOT DETECTED   Candida krusei NOT DETECTED NOT DETECTED   Candida parapsilosis NOT DETECTED NOT DETECTED   Candida tropicalis NOT DETECTED NOT DETECTED    Name of physician (or Provider) Contacted: Dr Cathlean Sauer  Changes to prescribed antibiotics required: stop ceftriaxone and resume vancomycin until evaluated by ID  Clovis Riley 08/05/2016  3:21 PM

## 2016-08-05 NOTE — Consult Note (Signed)
[facility] autoconsult for Enterococcal bacteremia.  Patient with obstructing calculi bilateral requiring emergent stent placement and now blood cultures 2/2 positive with GPC and BCID with Enterococcus.    I will add vancomycin  Full consult to follow in the am.

## 2016-08-05 NOTE — Progress Notes (Signed)
PROGRESS NOTE    Michelle Holmes  VZD:[id number] DOB: 15-Mar-1950 DOA: 08/04/2016 PCP: Lorella Nimrod, MD    Brief Narrative:  66 year old female presents with altered mental status, abdominal pain, nausea and vomiting. 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 Apparently patient has been not taking any medications at home. Patient developed abdominal pain, bilateral lower quadrants, severe in intensity, associated with nausea and vomiting, complicated by altered mentation. On initial physical examination blood pressure 150/83, heart 75, respiratory rate 11, oxygen saturation 100%. Her oral mucosa was moist, neck supple, lungs clear to auscultation bilaterally no wheezing rales or rhonchi, heart S1-S2 present rhythmic, her abdomen was tender in the lower quadrants, no rebound or peritoneal signs, lower extremity with no edema, she had right-sided hemiparesis. Sodium 142, potassium 4.5, chloride 114, bicarbonate 18, glucose 162, BUN 40, creatinine 2.24, calcium 9.1, white count 11.1, hemoglobin 10.2, hematocrit 30.8, platelets 219. Urinalysis with too numerous count white cells, CT of the abdomen showed obstructing calculi in both renal collecting system. On the left 95 mm stone at the ureteropelvic junction with mild hydronephrosis. On the right obstructing 34 mm stone at the ureterovesicular junction with moderate hydronephrosis and perinephric edema. Large volume of stool in the rectosigmoid colon suggesting fecal impaction. Chest x-ray hypoinflated, scoliosis, no infiltrates, effusions or pneumothorax. EKG normal sinus rhythm.  Patient was admitted to the stepdown unit working diagnosis of sepsis due to complicated urinary tract infection associated with obstructive uropathy.   Assessment & Plan:   Principal Problem:  Complicated UTI (urinary tract infection) Active Problems:   OSA on CPAP   HLD (hyperlipidemia)   Acute metabolic encephalopathy   Dvt femoral (deep venous thrombosis) (HCC)   Essential hypertension   Stroke (cerebrum) (HCC)   CKD (chronic kidney disease), stage IV (HCC)   Hypertensive urgency   Kidney stones   1. Sepsis due to urinary tract infection, complicated with bilateral obstructive uropathy, present on admission. Patient status post bilateral stents placement, will continue antibiotic therapy with ceftriaxone, patient had tolerated cephalosporins in the past. Will continue IV fluids at 75 ml per hour. Will continue to follow cell count, temperature curve and cultures. WBC at 13,6 from 11.1.Stop vancomycin, low pretest probability for MRSA infection.   2. CKD stage IV. Calculated GFR 20, considering a baseline cr of 2.5. Will continue hydration with saline, at 75 cc per hour, will follow a restrictive IV fluids strategy. Avoid hypotension or nephrotoxic agents. Serum K at 4.0 with serum bicarbonate at 16.   3. Metabolic encephalopathy with history of CVA. Patient more awake and alert, following commands, will continue neuro checks per unit protocol and medical management for sepsis. Patient with right sided hemiparesis.  4. HTN. Will continue blood pressure monitoring, keep MAP above 65 mmHg, will continue to hold amlodipine. Blood pressure systolic 295 to 188.  As needed hydralazine.   5. History of DVT. Holding oral anticoagulation for now, bridge with heparin IV, will plan to resume oral agents when patient more stable and Ok per surgery team.   6. T2DM. Will continue glucose cover and monitoring with insulin sliding scale, will resume po diet.   DVT prophylaxis: heparin IV Code Status: Full  Family Communication: No family at the bedside  Disposition Plan:    Consultants:   Urology   Procedures: 07/10 1. Cystoscopy 2. bilateral ureteral stent placement 3.  bilateral  retrograde pyelography with interpretation     Antimicrobials:   Ceftriaxone.    Subjective: Patient is feeling better, no chest pain or dyspnea, no nausea or vomiting. Tolerated procedure well. Increase appetite.   Objective: Vitals:   08/05/16 0400 08/05/16 0500 08/05/16 0600 08/05/16 0700  BP: 111/60 111/62 (!) 124/59 (!) 116/58  Pulse: 73 66 64 63  Resp: 16 14 14  (!) 9  Temp: 98.3 F (36.8 C)     TempSrc: Oral     SpO2: 100% 100% 100% 100%  Weight:      Height:        Intake/Output Summary (Last 24 hours) at 08/05/16 0755 Last data filed at 08/05/16 0700  Gross per 24 hour  Intake             1824 ml  Output              200 ml  Net             1624 ml   Filed Weights   08/04/16 1403 08/05/16 0018  Weight: 81.6 kg (180 lb) 75 kg (165 lb 5.5 oz)    Examination:  General exam: deconditioned E ENT. Mild pallor, no icterus, oral mucosa moist.  Respiratory system: No wheezing, rales or rhonchi. Respiratory effort normal. Cardiovascular system: S1 & S2 heard, RRR. No JVD, murmurs, rubs, gallops or clicks. No pedal edema. Gastrointestinal system: Abdomen is nondistended, soft and nontender. No organomegaly or masses felt. Normal bowel sounds heard. Central nervous system: Alert and oriented. Right upper extremity contracture, right sided hemiparesis.  Extremities: Symmetric 5 x 5 power. Skin: No rashes, lesions or ulcers     Data Reviewed: I have personally reviewed following labs and imaging studies  CBC:  Recent Labs Lab 08/04/16 1404 08/04/16 1416 08/05/16 0319  WBC 11.1*  --  13.6*  NEUTROABS 9.9*  --   --   HGB 10.2* 9.5* 8.8*  HCT 30.8* 28.0* 26.0*  MCV 92.2  --  91.5  PLT 219  --  712   Basic Metabolic Panel:  Recent Labs Lab 08/04/16 1404 08/04/16 1416 08/05/16 0319  NA 142 143 143  K 4.5 4.5 4.0  CL 114* 113* 116*  CO2 18*  --  16*  GLUCOSE 162* 159* 196*  BUN 40* 49* 41*  CREATININE 2.24* 2.20* 2.22*  CALCIUM 9.1  --  7.6*    GFR: Estimated Creatinine Clearance: 25.6 mL/min (A) (by C-G formula based on SCr of 2.22 mg/dL (H)). Liver Function Tests:  Recent Labs Lab 08/04/16 1404  AST 16  ALT 8*  ALKPHOS 53  BILITOT 0.8  PROT 6.4*  ALBUMIN 3.5   No results for input(s): LIPASE, AMYLASE in the last 168 hours. No results for input(s): AMMONIA in the last 168 hours. Coagulation Profile:  Recent Labs Lab 08/04/16 1404  INR 1.08   Cardiac Enzymes:  Recent Labs Lab 08/05/16 0054  TROPONINI <0.03   BNP (last 3 results) No results for input(s): PROBNP in the last 8760 hours. HbA1C: No results for input(s): HGBA1C in the last 72 hours. CBG:  Recent Labs Lab 08/04/16 1637 08/04/16 2335 08/05/16 0720  GLUCAP 159* 165* 175*   Lipid Profile: No results for input(s): CHOL, HDL, LDLCALC, TRIG, CHOLHDL, LDLDIRECT in the last 72 hours. Thyroid Function Tests: No results for input(s): TSH, T4TOTAL, FREET4, T3FREE, THYROIDAB in the last 72 hours. Anemia Panel: No results for input(s): VITAMINB12, FOLATE, FERRITIN, TIBC, IRON, RETICCTPCT in the last 72  hours. Sepsis Labs:  Recent Labs Lab 08/04/16 2204 08/05/16 0054 08/05/16 0319  PROCALCITON  --   --  1.71  LATICACIDVEN 1.73 2.4* 1.5    Recent Results (from the past 240 hour(s))  MRSA PCR Screening     Status: None   Collection Time: 08/05/16 12:09 AM  Result Value Ref Range Status   MRSA by PCR NEGATIVE NEGATIVE Final    Comment:        The GeneXpert MRSA Assay (FDA approved for NASAL specimens only), is one component of a comprehensive MRSA colonization surveillance program. It is not intended to diagnose MRSA infection nor to guide or monitor treatment for MRSA infections.          Radiology Studies: Ct Abdomen Pelvis Wo Contrast  Result Date: 08/04/2016 CLINICAL DATA:  Diffuse abdominal pain.  Vomiting. EXAM: CT ABDOMEN AND PELVIS WITHOUT CONTRAST TECHNIQUE: Multidetector CT imaging of the abdomen and pelvis was  performed following the standard protocol without IV contrast. Arms down positioning due to contractures. COMPARISON:  CT 1198 FINDINGS: Lower chest: Dependent lower lobe atelectasis, right greater than left. Coronary artery calcifications versus stents. Hepatobiliary: No focal hepatic lesion allowing for lack contrast and streak artifact from arms down positioning. Clips in the gallbladder fossa postcholecystectomy. No biliary dilatation. Pancreas: No ductal dilatation or inflammation. Spleen: Normal in size without focal abnormality. Adrenals/Urinary Tract: Low-density nodule on the right adrenal gland likely adenoma. Left adrenal gland is normal. Obstructing 3 mm stone in the distal right ureter with moderate proximal hydroureteronephrosis. Moderate perinephric edema. Multiple additional nonobstructing stones in the right kidney. There is a 9 x 5 mm stone in the left ureteropelvic junction with mild proximal hydronephrosis. At least 2 additional nonobstructing stones in the left kidney. More distal ureters decompressed. Urinary bladder is decompressed by Foley catheter linear hyperdensity in the region the right urinary trigone appears similar to prior. Stomach/Bowel: Large volume of stool in the rectosigmoid colon with rectal distention of 8.4 cm. Rectal wall thickening and perirectal edema. Sigmoid colon is tortuous. Moderate stool in the more proximal colon. Sigmoid colonic diverticulosis without diverticulitis. Enteric sutures in the ascending colon/ cecum. No small bowel dilatation. Stomach is decompressed. Small hiatal hernia. Probable duodenal diverticulum. Vascular/Lymphatic: Aortic atherosclerosis and tortuosity. Decreased sensitivity for adenopathy given lack contrast, no bulky intra-abdominal or pelvic adenopathy is seen. Reproductive: Uterus not visualize, likely surgically absent. No adnexal mass. Other: No ascites or free air. Multiple pelvic phleboliths. Scatter clips/coils in the mesentery.  Musculoskeletal: Scoliosis and degenerative change in the spine. Heterotopic calcification about the right hip. Scattered subcutaneous calcifications about both flanks likely dystrophic. IMPRESSION: 1. Obstructing calculi in both renal collecting systems. On the left there is a 9 x 5 mm stone at the ureteropelvic junction with mild hydronephrosis. On the right there is an obstructing 3 x 4 mm stone at the ureterovesicular junction with moderate hydronephrosis and perinephric edema. 2. Large volume of stool in the rectosigmoid colon with rectal distention, rectal wall thickening and perirectal edema, suggesting fecal impaction. Colonic diverticulosis without diverticulitis. 3. Small hiatal hernia. 4. Aortic atherosclerosis. Electronically Signed   By: Jeb Levering M.D.   On: 08/04/2016 18:30   Ct Head Wo Contrast  Result Date: 08/04/2016 CLINICAL DATA:  Right-sided facial droop and altered consciousness. History stroke with right-sided deficits. High blood pressure. EXAM: CT HEAD WITHOUT CONTRAST TECHNIQUE: Contiguous axial images were obtained from the base of the skull through the vertex without intravenous contrast. COMPARISON:  11/23/2015 FINDINGS: Brain: Age  advanced cerebral atrophy. Moderate and possibly progressive low density in the periventricular white matter likely related to small vessel disease. Left basal ganglia lacunar infarcts are felt to be similar. Left frontal lobe cortically based infarct is not significantly changed. Vascular: Intracranial atherosclerosis. Skull: No significant soft tissue swelling.  No skull fracture. Sinuses/Orbits: Normal imaged portions of the orbits and globes. Left maxillary sinus mucous retention cyst or polyp. Clear mastoid air cells. Other: None. IMPRESSION: 1. Age advanced cerebral atrophy with small vessel ischemic change, possibly progressive since February. 2. Remote left frontal infarct. 3. No convincing evidence of acute superimposed process.  Electronically Signed   By: Abigail Miyamoto M.D.   On: 08/04/2016 15:01   Dg Chest Port 1 View  Result Date: 08/04/2016 CLINICAL DATA:  Altered mental status with right-sided facial droop EXAM: PORTABLE CHEST 1 VIEW COMPARISON:  January 27, 2016 FINDINGS: There is no edema or consolidation. Heart is upper normal in size with pulmonary vascularity within normal limits. Aorta is tortuous. There is scoliosis in the thoracic region with multilevel degenerative type change. There is advanced arthropathy in each shoulder. IMPRESSION: No edema or consolidation. Stable cardiac silhouette. Advanced arthropathy in both shoulders. Electronically Signed   By: Lowella Grip III M.D.   On: 08/04/2016 15:22   Dg C-arm 1-60 Min-no Report  Result Date: 08/04/2016 Fluoroscopy was utilized by the requesting physician.  No radiographic interpretation.        Scheduled Meds: . hydrALAZINE  10 mg Intravenous Once  . insulin aspart  0-5 Units Subcutaneous QHS  . insulin aspart  0-9 Units Subcutaneous TID WC  . mouth rinse  15 mL Mouth Rinse BID  . sodium chloride flush  3 mL Intravenous Q12H   Continuous Infusions: . aztreonam Stopped (08/05/16 0158)  . heparin 1,000 Units/hr (08/05/16 0400)  . vancomycin       LOS: 1 day       Tawni Millers, MD [facility] Pager [phone]  If 7PM-7AM, please contact night-coverage [email or web address] Password [facility] 08/05/2016, 7:55 AM

## 2016-08-06 DIAGNOSIS — B952 Enterococcus as the cause of diseases classified elsewhere: Secondary | ICD-10-CM

## 2016-08-06 DIAGNOSIS — I82412 Acute embolism and thrombosis of left femoral vein: Secondary | ICD-10-CM

## 2016-08-06 DIAGNOSIS — Z794 Long term (current) use of insulin: Secondary | ICD-10-CM

## 2016-08-06 DIAGNOSIS — G9341 Metabolic encephalopathy: Secondary | ICD-10-CM

## 2016-08-06 DIAGNOSIS — E118 Type 2 diabetes mellitus with unspecified complications: Secondary | ICD-10-CM

## 2016-08-06 DIAGNOSIS — J449 Chronic obstructive pulmonary disease, unspecified: Secondary | ICD-10-CM

## 2016-08-06 DIAGNOSIS — Z79899 Other long term (current) drug therapy: Secondary | ICD-10-CM

## 2016-08-06 DIAGNOSIS — R7881 Bacteremia: Secondary | ICD-10-CM

## 2016-08-06 DIAGNOSIS — Z833 Family history of diabetes mellitus: Secondary | ICD-10-CM

## 2016-08-06 DIAGNOSIS — Z880 Allergy status to penicillin: Secondary | ICD-10-CM

## 2016-08-06 DIAGNOSIS — Z823 Family history of stroke: Secondary | ICD-10-CM

## 2016-08-06 DIAGNOSIS — R4182 Altered mental status, unspecified: Secondary | ICD-10-CM

## 2016-08-06 LAB — GLUCOSE, CAPILLARY
Glucose-Capillary: 109 mg/dL — ABNORMAL HIGH (ref 65–99)
Glucose-Capillary: 163 mg/dL — ABNORMAL HIGH (ref 65–99)
Glucose-Capillary: 179 mg/dL — ABNORMAL HIGH (ref 65–99)

## 2016-08-06 LAB — CBC
HCT: 24 % — ABNORMAL LOW (ref 36.0–46.0)
Hemoglobin: 8.2 g/dL — ABNORMAL LOW (ref 12.0–15.0)
MCH: 31.4 pg (ref 26.0–34.0)
MCHC: 34.2 g/dL (ref 30.0–36.0)
MCV: 92 fL (ref 78.0–100.0)
Platelets: 185 10*3/uL (ref 150–400)
RBC: 2.61 MIL/uL — ABNORMAL LOW (ref 3.87–5.11)
RDW: 14.7 % (ref 11.5–15.5)
WBC: 9.6 10*3/uL (ref 4.0–10.5)

## 2016-08-06 LAB — BASIC METABOLIC PANEL
Anion gap: 9 (ref 5–15)
Anion gap: 9 (ref 5–15)
BUN: 46 mg/dL — ABNORMAL HIGH (ref 6–20)
BUN: 45 mg/dL — ABNORMAL HIGH (ref 6–20)
CO2: 19 mmol/L — ABNORMAL LOW (ref 22–32)
CO2: 19 mmol/L — ABNORMAL LOW (ref 22–32)
Calcium: 8.4 mg/dL — ABNORMAL LOW (ref 8.9–10.3)
Calcium: 8.5 mg/dL — ABNORMAL LOW (ref 8.9–10.3)
Chloride: 114 mmol/L — ABNORMAL HIGH (ref 101–111)
Chloride: 113 mmol/L — ABNORMAL HIGH (ref 101–111)
Creatinine, Ser: 2.63 mg/dL — ABNORMAL HIGH (ref 0.44–1.00)
Creatinine, Ser: 2.57 mg/dL — ABNORMAL HIGH (ref 0.44–1.00)
GFR calc Af Amer: 21 mL/min — ABNORMAL LOW (ref >60)
GFR calc Af Amer: 21 mL/min — ABNORMAL LOW (ref >60)
GFR calc non Af Amer: 18 mL/min — ABNORMAL LOW (ref >60)
GFR calc non Af Amer: 18 mL/min — ABNORMAL LOW (ref >60)
Glucose, Bld: 109 mg/dL — ABNORMAL HIGH (ref 65–99)
Glucose, Bld: 120 mg/dL — ABNORMAL HIGH (ref 65–99)
Potassium: 4.5 mmol/L (ref 3.5–5.1)
Potassium: 4.6 mmol/L (ref 3.5–5.1)
Sodium: 142 mmol/L (ref 135–145)
Sodium: 141 mmol/L (ref 135–145)

## 2016-08-06 LAB — HIV ANTIBODY (ROUTINE TESTING W REFLEX): HIV Screen 4th Generation wRfx: NONREACTIVE

## 2016-08-06 LAB — HEPARIN LEVEL (UNFRACTIONATED): Heparin Unfractionated: 0.34 IU/mL (ref 0.30–0.70)

## 2016-08-06 MED ORDER — VANCOMYCIN HCL IN DEXTROSE 1-5 GM/200ML-% IV SOLN: 1000.0000 mg | INTRAVENOUS | Status: DC

## 2016-08-06 MED ORDER — CIPROFLOXACIN IN D5W 400 MG/200ML IV SOLN
400.0000 mg | INTRAVENOUS | Status: DC
  Administered 2016-08-06: 400 mg via INTRAVENOUS
  Filled 2016-08-06: qty 200

## 2016-08-06 NOTE — Progress Notes (Signed)
Pt active with [facility] services. CM will continue to follow for DC needs. Marney Doctor RN,BSN,NCM [phone]

## 2016-08-06 NOTE — Progress Notes (Signed)
PROGRESS NOTE    Michelle Holmes  FYB:[id number] DOB: 08-Aug-1950 DOA: 08/04/2016 PCP: Lorella Nimrod, MD    Brief Narrative:  66 year old female presents with altered mental status, abdominal pain, nausea and vomiting. 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 Apparently patient has been not taking any medications at home. Patient developed abdominal pain, bilateral lower quadrants, severe in intensity, associated with nausea and vomiting, complicated by altered mentation. On initial physical examination blood pressure 150/83, heart 75, respiratory rate 11, oxygen saturation 100%. Her oral mucosa was moist, neck supple, lungs clear to auscultation bilaterally no wheezing rales or rhonchi, heart S1-S2 present rhythmic, her abdomen was tender in the lower quadrants, no rebound or peritoneal signs, lower extremity with no edema, she had right-sided hemiparesis. Sodium 142, potassium 4.5, chloride 114, bicarbonate 18, glucose 162, BUN 40, creatinine 2.24, calcium 9.1, white count 11.1, hemoglobin 10.2, hematocrit 30.8, platelets 219. Urinalysis with too numerous count white cells, CT of the abdomen showed obstructing calculi in both renal collecting system. On the left 95 mm stone at the ureteropelvic junction with mild hydronephrosis. On the right obstructing 34 mm stone at the ureterovesicular junction with moderate hydronephrosis and perinephric edema. Large volume of stool in the rectosigmoid colon suggesting fecal impaction. Chest x-ray hypoinflated, scoliosis, no infiltrates, effusions or pneumothorax. EKG normal sinus rhythm.  Patient was admitted to the stepdown unit working diagnosis of sepsis due to complicated urinary tract infection associated with obstructive uropathy.    Assessment & Plan:   Principal Problem:   Complicated UTI (urinary tract infection) Active Problems:   OSA on CPAP   HLD (hyperlipidemia)   Acute metabolic encephalopathy   Dvt femoral (deep venous thrombosis) (HCC)   Essential hypertension   Stroke (cerebrum) (HCC)   CKD (chronic kidney disease), stage IV (HCC)   Hypertensive urgency   Kidney stones   1. Sepsis due to urinary tract infection, complicated with bilateral obstructive uropathy, present on admission. Status post bilateral stents placement, urine culture with gram negative bacteria and blood culture with enterococcus, will change antibiotic therapy to IV ciprofloxacin and will follow on sensitivities  2. CKD stage IV. Stable renal function with cr at 2,57 from 2,65, will reduce rate of IV fluids to avoid volume overload. Follow on renal panel in am, avoid hypotension or nephrotoxic medications. Serum K at 4,6, serum bicarbonate at 19.   3. Metabolic encephalopathy with history of CVA. Resolved, patient is back to her baseline, will continue neuro checks per unit protocol and will transfer to medical unit.   4. HTN. Blood pressure systolic 527 to 782, as needed hydralazine, will continue to hold on amlodipine.   5. History of DVT. Continue heparin drip for now for bridging therapy. Will resume oral agents when safe from surgical perspective.   6. T2DM. Glucose cover and monitoring with insulin sliding scale, capillary glucose 127, 122, 109.   DVT prophylaxis: heparin IV Code Status: Full  Family Communication: No family at the bedside  Disposition Plan:    Consultants:   Urology   Procedures: 07/10 1. Cystoscopy 2. bilateralureteral stent placement 3. bilateralretrograde pyelography with interpretation     Antimicrobials:   Ceftriaxone.     Subjective: Patient feeling better, no nausea or vomiting, no chest pain. Tolerating po well. Positive hematuria on foley bag.   Objective: Vitals:   08/06/16 0200 08/06/16 0400  08/06/16 0423  08/06/16 0600  BP: 140/64 (!) 143/76  136/66  Pulse: 78 79  74  Resp: 15 14  18   Temp:   98.3 F (36.8 C)   TempSrc:   Oral   SpO2: 93% 100%  96%  Weight:      Height:        Intake/Output Summary (Last 24 hours) at 08/06/16 0805 Last data filed at 08/06/16 0459  Gross per 24 hour  Intake          1808.75 ml  Output             1300 ml  Net           508.75 ml   Filed Weights   08/04/16 1403 08/05/16 0018  Weight: 81.6 kg (180 lb) 75 kg (165 lb 5.5 oz)    Examination:  General exam: not in pain or dyspnea E ENT: mild pallor, no icterus, oral mucosa moist.  Respiratory system: Clear to auscultation. Respiratory effort normal. No wheezing, rales or rhonchi.  Cardiovascular system: S1 & S2 heard, RRR. No JVD, murmurs, rubs, gallops or clicks. No pedal edema. Gastrointestinal system: Abdomen is nondistended, soft and nontender. No organomegaly or masses felt. Normal bowel sounds heard. Central nervous system: Alert and oriented. No focal neurological deficits. Extremities: Symmetric 5 x 5 power. Skin: No rashes, lesions or ulcers    Data Reviewed: I have personally reviewed following labs and imaging studies  CBC:  Recent Labs Lab 08/04/16 1404 08/04/16 1416 08/05/16 0319 08/06/16 0308  WBC 11.1*  --  13.6* 9.6  NEUTROABS 9.9*  --   --   --   HGB 10.2* 9.5* 8.8* 8.2*  HCT 30.8* 28.0* 26.0* 24.0*  MCV 92.2  --  91.5 92.0  PLT 219  --  180 462   Basic Metabolic Panel:  Recent Labs Lab 08/04/16 1404 08/04/16 1416 08/05/16 0319 08/06/16 0308  NA 142 143 143 142  K 4.5 4.5 4.0 4.5  CL 114* 113* 116* 114*  CO2 18*  --  16* 19*  GLUCOSE 162* 159* 196* 109*  BUN 40* 49* 41* 46*  CREATININE 2.24* 2.20* 2.22* 2.63*  CALCIUM 9.1  --  7.6* 8.4*   GFR: Estimated Creatinine Clearance: 21.6 mL/min (A) (by C-G formula based on SCr of 2.63 mg/dL (H)). Liver Function Tests:  Recent Labs Lab 08/04/16 1404  AST 16  ALT 8*  ALKPHOS 53  BILITOT 0.8  PROT 6.4*    ALBUMIN 3.5   No results for input(s): LIPASE, AMYLASE in the last 168 hours. No results for input(s): AMMONIA in the last 168 hours. Coagulation Profile:  Recent Labs Lab 08/04/16 1404  INR 1.08   Cardiac Enzymes:  Recent Labs Lab 08/05/16 0054 08/05/16 0818 08/05/16 1029  TROPONINI <0.03 <0.03 <0.03   BNP (last 3 results) No results for input(s): PROBNP in the last 8760 hours. HbA1C: No results for input(s): HGBA1C in the last 72 hours. CBG:  Recent Labs Lab 08/05/16 0720 08/05/16 1115 08/05/16 1510 08/05/16 1559 08/05/16 2135  GLUCAP 175* 185* 138* 127* 122*   Lipid Profile: No results for input(s): CHOL, HDL, LDLCALC, TRIG, CHOLHDL, LDLDIRECT in the last 72 hours. Thyroid Function Tests: No results for input(s): TSH, T4TOTAL, FREET4, T3FREE, THYROIDAB in the last 72 hours. Anemia Panel: No results for input(s): VITAMINB12, FOLATE, FERRITIN, TIBC, IRON, RETICCTPCT in the last 72 hours. Sepsis Labs:  Recent Labs Lab 08/04/16 2204 08/05/16 0054 08/05/16 0319 08/05/16 0818  PROCALCITON  --   --  1.71  --   LATICACIDVEN 1.73 2.4* 1.5 1.6    Recent Results (from the past 240 hour(s))  Culture, blood (routine x 2)     Status: None (Preliminary result)   Collection Time: 08/04/16  9:53 PM  Result Value Ref Range Status   Specimen Description BLOOD LEFT ANTECUBITAL  Final   Special Requests   Final    BOTTLES DRAWN AEROBIC AND ANAEROBIC Blood Culture adequate volume   Culture  Setup Time   Final    GRAM POSITIVE COCCI IN CHAINS IN BOTH AEROBIC AND ANAEROBIC BOTTLES CRITICAL RESULT CALLED TO, READ BACK BY AND VERIFIED WITH: Orme Leming, Albert Lea 08/05/16 1512 L CHAMPION    Culture GRAM POSITIVE COCCI IN CHAINS  Final   Report Status PENDING  Incomplete  Blood Culture ID Panel (Reflexed)     Status: Abnormal   Collection Time: 08/04/16  9:53 PM  Result Value Ref Range Status   Enterococcus species DETECTED (A) NOT DETECTED Final    Comment: CRITICAL  RESULT CALLED TO, READ BACK BY AND VERIFIED WITH: NICK GLOGOVAC, PHARMD 08/05/16 1512 L CHAMPION    Vancomycin resistance NOT DETECTED NOT DETECTED Final   Listeria monocytogenes NOT DETECTED NOT DETECTED Final   Staphylococcus species NOT DETECTED NOT DETECTED Final   Staphylococcus aureus NOT DETECTED NOT DETECTED Final   Streptococcus species NOT DETECTED NOT DETECTED Final   Streptococcus agalactiae NOT DETECTED NOT DETECTED Final   Streptococcus pneumoniae NOT DETECTED NOT DETECTED Final   Streptococcus pyogenes NOT DETECTED NOT DETECTED Final   Acinetobacter baumannii NOT DETECTED NOT DETECTED Final   Enterobacteriaceae species NOT DETECTED NOT DETECTED Final   Enterobacter cloacae complex NOT DETECTED NOT DETECTED Final   Escherichia coli NOT DETECTED NOT DETECTED Final   Klebsiella oxytoca NOT DETECTED NOT DETECTED Final   Klebsiella pneumoniae NOT DETECTED NOT DETECTED Final   Proteus species NOT DETECTED NOT DETECTED Final   Serratia marcescens NOT DETECTED NOT DETECTED Final   Haemophilus influenzae NOT DETECTED NOT DETECTED Final   Neisseria meningitidis NOT DETECTED NOT DETECTED Final   Pseudomonas aeruginosa NOT DETECTED NOT DETECTED Final   Candida albicans NOT DETECTED NOT DETECTED Final   Candida glabrata NOT DETECTED NOT DETECTED Final   Candida krusei NOT DETECTED NOT DETECTED Final   Candida parapsilosis NOT DETECTED NOT DETECTED Final   Candida tropicalis NOT DETECTED NOT DETECTED Final  Culture, blood (routine x 2)     Status: None (Preliminary result)   Collection Time: 08/04/16 10:02 PM  Result Value Ref Range Status   Specimen Description BLOOD BLOOD LEFT FOREARM  Final   Special Requests   Final    BOTTLES DRAWN AEROBIC AND ANAEROBIC Blood Culture adequate volume   Culture  Setup Time   Final    GRAM POSITIVE COCCI IN CHAINS IN BOTH AEROBIC AND ANAEROBIC BOTTLES CRITICAL VALUE NOTED.  VALUE IS CONSISTENT WITH PREVIOUSLY REPORTED AND CALLED VALUE.     Culture GRAM POSITIVE COCCI IN CHAINS  Final   Report Status PENDING  Incomplete  MRSA PCR Screening     Status: None   Collection Time: 08/05/16 12:09 AM  Result Value Ref Range Status   MRSA by PCR NEGATIVE NEGATIVE Final    Comment:        The GeneXpert MRSA Assay (FDA approved for NASAL specimens only), is one component of a comprehensive MRSA colonization surveillance program. It is not intended to diagnose MRSA infection nor to guide or monitor treatment for MRSA infections.  Radiology Studies: Ct Abdomen Pelvis Wo Contrast  Result Date: 08/04/2016 CLINICAL DATA:  Diffuse abdominal pain.  Vomiting. EXAM: CT ABDOMEN AND PELVIS WITHOUT CONTRAST TECHNIQUE: Multidetector CT imaging of the abdomen and pelvis was performed following the standard protocol without IV contrast. Arms down positioning due to contractures. COMPARISON:  CT 1198 FINDINGS: Lower chest: Dependent lower lobe atelectasis, right greater than left. Coronary artery calcifications versus stents. Hepatobiliary: No focal hepatic lesion allowing for lack contrast and streak artifact from arms down positioning. Clips in the gallbladder fossa postcholecystectomy. No biliary dilatation. Pancreas: No ductal dilatation or inflammation. Spleen: Normal in size without focal abnormality. Adrenals/Urinary Tract: Low-density nodule on the right adrenal gland likely adenoma. Left adrenal gland is normal. Obstructing 3 mm stone in the distal right ureter with moderate proximal hydroureteronephrosis. Moderate perinephric edema. Multiple additional nonobstructing stones in the right kidney. There is a 9 x 5 mm stone in the left ureteropelvic junction with mild proximal hydronephrosis. At least 2 additional nonobstructing stones in the left kidney. More distal ureters decompressed. Urinary bladder is decompressed by Foley catheter linear hyperdensity in the region the right urinary trigone appears similar to prior. Stomach/Bowel:  Large volume of stool in the rectosigmoid colon with rectal distention of 8.4 cm. Rectal wall thickening and perirectal edema. Sigmoid colon is tortuous. Moderate stool in the more proximal colon. Sigmoid colonic diverticulosis without diverticulitis. Enteric sutures in the ascending colon/ cecum. No small bowel dilatation. Stomach is decompressed. Small hiatal hernia. Probable duodenal diverticulum. Vascular/Lymphatic: Aortic atherosclerosis and tortuosity. Decreased sensitivity for adenopathy given lack contrast, no bulky intra-abdominal or pelvic adenopathy is seen. Reproductive: Uterus not visualize, likely surgically absent. No adnexal mass. Other: No ascites or free air. Multiple pelvic phleboliths. Scatter clips/coils in the mesentery. Musculoskeletal: Scoliosis and degenerative change in the spine. Heterotopic calcification about the right hip. Scattered subcutaneous calcifications about both flanks likely dystrophic. IMPRESSION: 1. Obstructing calculi in both renal collecting systems. On the left there is a 9 x 5 mm stone at the ureteropelvic junction with mild hydronephrosis. On the right there is an obstructing 3 x 4 mm stone at the ureterovesicular junction with moderate hydronephrosis and perinephric edema. 2. Large volume of stool in the rectosigmoid colon with rectal distention, rectal wall thickening and perirectal edema, suggesting fecal impaction. Colonic diverticulosis without diverticulitis. 3. Small hiatal hernia. 4. Aortic atherosclerosis. Electronically Signed   By: Jeb Levering M.D.   On: 08/04/2016 18:30   Ct Head Wo Contrast  Result Date: 08/04/2016 CLINICAL DATA:  Right-sided facial droop and altered consciousness. History stroke with right-sided deficits. High blood pressure. EXAM: CT HEAD WITHOUT CONTRAST TECHNIQUE: Contiguous axial images were obtained from the base of the skull through the vertex without intravenous contrast. COMPARISON:  11/23/2015 FINDINGS: Brain: Age  advanced cerebral atrophy. Moderate and possibly progressive low density in the periventricular white matter likely related to small vessel disease. Left basal ganglia lacunar infarcts are felt to be similar. Left frontal lobe cortically based infarct is not significantly changed. Vascular: Intracranial atherosclerosis. Skull: No significant soft tissue swelling.  No skull fracture. Sinuses/Orbits: Normal imaged portions of the orbits and globes. Left maxillary sinus mucous retention cyst or polyp. Clear mastoid air cells. Other: None. IMPRESSION: 1. Age advanced cerebral atrophy with small vessel ischemic change, possibly progressive since February. 2. Remote left frontal infarct. 3. No convincing evidence of acute superimposed process. Electronically Signed   By: Abigail Miyamoto M.D.   On: 08/04/2016 15:01   Dg Chest [facility] 1 [address]  Result Date: 08/04/2016 CLINICAL DATA:  Altered mental status with right-sided facial droop EXAM: PORTABLE CHEST 1 VIEW COMPARISON:  January 27, 2016 FINDINGS: There is no edema or consolidation. Heart is upper normal in size with pulmonary vascularity within normal limits. Aorta is tortuous. There is scoliosis in the thoracic region with multilevel degenerative type change. There is advanced arthropathy in each shoulder. IMPRESSION: No edema or consolidation. Stable cardiac silhouette. Advanced arthropathy in both shoulders. Electronically Signed   By: Lowella Grip III M.D.   On: 08/04/2016 15:22   Dg C-arm 1-60 Min-no Report  Result Date: 08/04/2016 Fluoroscopy was utilized by the requesting physician.  No radiographic interpretation.        Scheduled Meds: . hydrALAZINE  10 mg Intravenous Once  . insulin aspart  0-5 Units Subcutaneous QHS  . insulin aspart  0-9 Units Subcutaneous TID WC  . mouth rinse  15 mL Mouth Rinse BID  . sodium bicarbonate  650 mg Oral Daily  . sodium chloride flush  3 mL Intravenous Q12H   Continuous Infusions: . sodium chloride 75 mL/hr  at 08/05/16 2000  . heparin 1,150 Units/hr (08/05/16 2213)  . vancomycin       LOS: 2 days        Tawni Millers, MD [facility] Pager [phone]  If 7PM-7AM, please contact night-coverage [email or web address] Password [facility] 08/06/2016, 8:05 AM

## 2016-08-06 NOTE — Progress Notes (Signed)
ANTICOAGULATION CONSULT NOTE - Follow Up Consult  Pharmacy Consult for heparin Indication: hx DVT  Allergies  Allergen Reactions  . Penicillins Hives and Swelling    ++ tolerates ceftriaxone++  Has patient had a PCN reaction causing immediate rash, facial/tongue/throat swelling, SOB or lightheadedness with hypotension: yes Has patient had a PCN reaction causing severe rash involving mucus membranes or skin necrosis: no Has patient had a PCN reaction that required hospitalization no Has patient had a PCN reaction occurring within the last 10 years: no If all of the above answers are "NO", then may proceed with Cephalosporin use.     Patient Measurements: Height: 5\' 5"  (165.1 cm) Weight: 165 lb 5.5 oz (75 kg) IBW/kg (Calculated) : 57 Heparin Dosing Weight: 72 kg  Vital Signs: Temp: 98.3 F (36.8 C) (07/12 0423) Temp Source: Oral (07/12 0423) BP: 136/66 (07/12 0600) Pulse Rate: 74 (07/12 0600)  Labs:  Recent Labs  08/04/16 1404 08/04/16 1416 08/05/16 0054  08/05/16 0319 08/05/16 0818 08/05/16 1029 08/05/16 1945 08/06/16 0308  HGB 10.2* 9.5*  --   --  8.8*  --   --   --  8.2*  HCT 30.8* 28.0*  --   --  26.0*  --   --   --  24.0*  PLT 219  --   --   --  180  --   --   --  185  APTT 32  --   --   --   --   --   --   --   --   LABPROT 14.0  --   --   --   --   --   --   --   --   INR 1.08  --   --   --   --   --   --   --   --   HEPARINUNFRC  --   --   --   < >  --   --  0.21* 0.42 0.34  CREATININE 2.24* 2.20*  --   --  2.22*  --   --   --  2.63*  TROPONINI  --   --  <0.03  --   --  <0.03 <0.03  --   --   < > = values in this interval not displayed.  Estimated Creatinine Clearance: 21.6 mL/min (A) (by C-G formula based on SCr of 2.63 mg/dL (H)).   Medications:  xarelto 10mg  daily PTA (pt reported that the last dose she took was on 07/31/16- said that she has been forgetting to take it)  Assessment: Patient's a 66 y.o F  With hx DVT on xarelto PTA, presented to the  ED on 08/04/16 with c/o AMS.  Abd CT showed bilateral obstructing stones-- s/p cystoscopy with bilateral ureteral stent placement on 08/04/16.  Xarelto placed on hold since admission and pt was transitioned to heparin drip.  Today, 08/06/2016: - Heparin level remains therapeutic(=0.34) with rate at 1150 units/hr - Hgb down slightly to 8.2, platelets WNL and stable - No bleeding documented  Goal of Therapy:  Heparin level 0.3-0.7 units/ml Monitor platelets by anticoagulation protocol: Yes   Plan:  - Continue heparin drip at 1150 units/hr - Daily heparin level and CBC - monitor for s/s bleeding - f/u plan for resuming xarelto  Hershal Coria, PharmD, BCPS Pager: [phone] 08/06/2016 7:05 AM

## 2016-08-06 NOTE — Consult Note (Signed)
[facility]       Reason for Consult: Enterococcal bacteremia    Referring Physician: CHAMP autoconsult  Principal Problem:   Complicated UTI (urinary tract infection) Active Problems:   OSA on CPAP   HLD (hyperlipidemia)   Acute metabolic encephalopathy   Dvt femoral (deep venous thrombosis) (HCC)   Essential hypertension   Stroke (cerebrum) (HCC)   CKD (chronic kidney disease), stage IV (HCC)   Hypertensive urgency   Kidney stones   . hydrALAZINE  10 mg Intravenous Once  . insulin aspart  0-5 Units Subcutaneous QHS  . insulin aspart  0-9 Units Subcutaneous TID WC  . mouth rinse  15 mL Mouth Rinse BID  . sodium bicarbonate  650 mg Oral Daily  . sodium chloride flush  3 mL Intravenous Q12H    Recommendations: I recommend continuing vancomycin for the bacteremia rather than cipro  Can use linezolid at discharge  Continue cipro for the E coli, can narrow to keflex if sensitive Repeat blood cultures to assure clearance TTE If repeat cultures remain positive will need a TEE  Assessment: She has 2/2 blood cultures with Enterococcal  Faecalis and a urine culture with E coli in the setting of sepsis, obstructing calculi with emergent stent placement.   Encephalopathy - resolved now after treatment  Antibiotics: Vancomycin stopped cipro day 1 Ceftriaxone 1 dose aztreonam 1 dose  HPI: Michelle Holmes is a 66 y.o. female with htn, DM, COPD and stroke came in with AMS, n/v increased lactate and obstructive uropathy.  Emergent stents placed and blood and urine cultures as above.  She feels better since admission, now afebrile, wbc wnl with a max of 13.6.  No abdominal pain, no chills. No associated n/v/d, no rashes.      Review of Systems:  Constitutional: negative for chills, fatigue and malaise Cardiovascular: negative for chest pain Gastrointestinal: negative for diarrhea Integument/breast: negative for rash All other systems reviewed and  are negative    Past Medical History:  Diagnosis Date  . Anemia   . CHF (congestive heart failure) ([place])   . Chronic back pain   . COPD (chronic obstructive pulmonary disease) ([place])   . Coronary artery disease   . Diabetes mellitus   . DJD (degenerative joint disease)   . Emphysema   . Hypercholesteremia   . Hypertension   . Myocardial infarction ([place])   . Obesity   . Obesity hypoventilation syndrome ([place])   . Renal insufficiency   . Scoliosis   . Sleep apnea   . Stroke ([place])   . TIA (transient ischemic attack)     Social History  Substance Use Topics  . Smoking status: Never Smoker  . Smokeless tobacco: Never Used  . Alcohol use No    Family History  Problem Relation Age of Onset  . Diabetes Mother   . Stroke Maternal Aunt     Allergies  Allergen Reactions  . Penicillins Hives and Swelling    ++ tolerates ceftriaxone++  Has patient had a PCN reaction causing immediate rash, facial/tongue/throat swelling, SOB or lightheadedness with hypotension: yes Has patient had a PCN reaction causing severe rash involving mucus membranes or skin necrosis: no Has patient had a PCN reaction that required hospitalization no Has patient had a PCN reaction occurring within the last 10 years: no If all of the above answers are "NO", then may proceed with Cephalosporin use.     Physical Exam: Constitutional: in no apparent distress and  alert  Vitals:   08/06/16 0800 08/06/16 1043  BP: (!) 154/72 135/79  Pulse: 74 71  Resp: 16 16  Temp: 98.7 F (37.1 C) 99.2 F (37.3 C)   EYES: anicteric ENMT: no thrush Cardiovascular: Cor RRR Respiratory: CTA B; normal respiratory effort GI: Bowel sounds are normal, liver is not enlarged, spleen is not enlarged, soft, nt Musculoskeletal: no pedal edema noted Skin: negatives: no rash Hematologic: no cervical lad  Lab Results  Component Value Date   WBC 9.6 08/06/2016   HGB 8.2 (L) 08/06/2016   HCT 24.0 (L) 08/06/2016   MCV 92.0  08/06/2016   PLT 185 08/06/2016    Lab Results  Component Value Date   CREATININE 2.57 (H) 08/06/2016   BUN 45 (H) 08/06/2016   NA 141 08/06/2016   K 4.6 08/06/2016   CL 113 (H) 08/06/2016   CO2 19 (L) 08/06/2016    Lab Results  Component Value Date   ALT 8 (L) 08/04/2016   AST 16 08/04/2016   ALKPHOS 53 08/04/2016     Microbiology: Recent Results (from the past 240 hour(s))  Urine Culture     Status: Abnormal (Preliminary result)   Collection Time: 08/04/16  7:52 PM  Result Value Ref Range Status   Specimen Description URINE, CATHETERIZED  Final   Special Requests NONE  Final   Culture (A)  Final    >=100,000 COLONIES/mL ESCHERICHIA COLI SUSCEPTIBILITIES TO FOLLOW    Report Status PENDING  Incomplete  Culture, blood (routine x 2)     Status: Abnormal (Preliminary result)   Collection Time: 08/04/16  9:53 PM  Result Value Ref Range Status   Specimen Description BLOOD LEFT ANTECUBITAL  Final   Special Requests   Final    BOTTLES DRAWN AEROBIC AND ANAEROBIC Blood Culture adequate volume   Culture  Setup Time   Final    GRAM POSITIVE COCCI IN CHAINS IN BOTH AEROBIC AND ANAEROBIC BOTTLES CRITICAL RESULT CALLED TO, READ BACK BY AND VERIFIED WITH: Dendron Clyde, PHARMD 08/05/16 1512 L CHAMPION    Culture (A)  Final    ENTEROCOCCUS FAECALIS SUSCEPTIBILITIES TO FOLLOW Performed at [facility], [address] [place], [place] [address]    Report Status PENDING  Incomplete  Blood Culture ID Panel (Reflexed)     Status: Abnormal   Collection Time: 08/04/16  9:53 PM  Result Value Ref Range Status   Enterococcus species DETECTED (A) NOT DETECTED Final    Comment: CRITICAL RESULT CALLED TO, READ BACK BY AND VERIFIED WITH: NICK GLOGOVAC, PHARMD 08/05/16 1512 L CHAMPION    Vancomycin resistance NOT DETECTED NOT DETECTED Final   Listeria monocytogenes NOT DETECTED NOT DETECTED Final   Staphylococcus species NOT DETECTED NOT DETECTED Final   Staphylococcus aureus NOT  DETECTED NOT DETECTED Final   Streptococcus species NOT DETECTED NOT DETECTED Final   Streptococcus agalactiae NOT DETECTED NOT DETECTED Final   Streptococcus pneumoniae NOT DETECTED NOT DETECTED Final   Streptococcus pyogenes NOT DETECTED NOT DETECTED Final   Acinetobacter baumannii NOT DETECTED NOT DETECTED Final   Enterobacteriaceae species NOT DETECTED NOT DETECTED Final   Enterobacter cloacae complex NOT DETECTED NOT DETECTED Final   Escherichia coli NOT DETECTED NOT DETECTED Final   Klebsiella oxytoca NOT DETECTED NOT DETECTED Final   Klebsiella pneumoniae NOT DETECTED NOT DETECTED Final   Proteus species NOT DETECTED NOT DETECTED Final   Serratia marcescens NOT DETECTED NOT DETECTED Final   Haemophilus influenzae NOT DETECTED NOT DETECTED Final  Neisseria meningitidis NOT DETECTED NOT DETECTED Final   Pseudomonas aeruginosa NOT DETECTED NOT DETECTED Final   Candida albicans NOT DETECTED NOT DETECTED Final   Candida glabrata NOT DETECTED NOT DETECTED Final   Candida krusei NOT DETECTED NOT DETECTED Final   Candida parapsilosis NOT DETECTED NOT DETECTED Final   Candida tropicalis NOT DETECTED NOT DETECTED Final  Culture, blood (routine x 2)     Status: Abnormal (Preliminary result)   Collection Time: 08/04/16 10:02 PM  Result Value Ref Range Status   Specimen Description BLOOD BLOOD LEFT FOREARM  Final   Special Requests   Final    BOTTLES DRAWN AEROBIC AND ANAEROBIC Blood Culture adequate volume   Culture  Setup Time   Final    GRAM POSITIVE COCCI IN CHAINS IN BOTH AEROBIC AND ANAEROBIC BOTTLES CRITICAL VALUE NOTED.  VALUE IS CONSISTENT WITH PREVIOUSLY REPORTED AND CALLED VALUE.    Culture ENTEROCOCCUS FAECALIS (A)  Final   Report Status PENDING  Incomplete  Urine Culture     Status: None (Preliminary result)   Collection Time: 08/04/16 11:11 PM  Result Value Ref Range Status   Specimen Description CYSTOSCOPY  Final   Special Requests NONE  Final   Culture   Final     CULTURE REINCUBATED FOR BETTER GROWTH Performed at [facility], [address] [place], [place] [address]    Report Status PENDING  Incomplete  MRSA PCR Screening     Status: None   Collection Time: 08/05/16 12:09 AM  Result Value Ref Range Status   MRSA by PCR NEGATIVE NEGATIVE Final    Comment:        The GeneXpert MRSA Assay (FDA approved for NASAL specimens only), is one component of a comprehensive MRSA colonization surveillance program. It is not intended to diagnose MRSA infection nor to guide or monitor treatment for MRSA infections.     Scharlene Gloss, [facility] [place] [email or web address] [phone] pager  [phone] cell 08/06/2016, 2:30 PM

## 2016-08-06 NOTE — Progress Notes (Signed)
2 Days Post-Op  Subjective: Mrs. Kugler is improving post stenting on antibiotics.  She voices no complaints.  ROS:  Review of Systems  Unable to perform ROS: Mental acuity    Anti-infectives: Anti-infectives    Start     Dose/Rate Route Frequency Ordered Stop   08/06/16 1500  vancomycin (VANCOCIN) IVPB 1000 mg/200 mL premix  Status:  Discontinued     1,000 mg 200 mL/hr over 60 Minutes Intravenous Every 24 hours 08/06/16 1457 08/06/16 1510   08/06/16 1100  ciprofloxacin (CIPRO) IVPB 400 mg     400 mg 200 mL/hr over 60 Minutes Intravenous Every 24 hours 08/06/16 1003     08/06/16 1000  vancomycin (VANCOCIN) IVPB 750 mg/150 ml premix  Status:  Discontinued     750 mg 150 mL/hr over 60 Minutes Intravenous Every 24 hours 08/05/16 1533 08/06/16 0913   08/05/16 1000  cefTRIAXone (ROCEPHIN) 1 g in dextrose 5 % 50 mL IVPB  Status:  Discontinued     1 g 100 mL/hr over 30 Minutes Intravenous Every 24 hours 08/05/16 0932 08/05/16 1531   08/05/16 1000  vancomycin (VANCOCIN) IVPB 1000 mg/200 mL premix  Status:  Discontinued     1,000 mg 200 mL/hr over 60 Minutes Intravenous Every 24 hours 08/05/16 0936 08/05/16 1424   08/05/16 0200  aztreonam (AZACTAM) 1 g in dextrose 5 % 50 mL IVPB  Status:  Discontinued     1 g 100 mL/hr over 30 Minutes Intravenous Every 8 hours 08/04/16 2235 08/05/16 0040   08/05/16 0200  aztreonam (AZACTAM) 1 GM IVPB  Status:  Discontinued     1 g 100 mL/hr over 30 Minutes Intravenous Every 8 hours 08/05/16 0040 08/05/16 0930   08/04/16 2359  vancomycin (VANCOCIN) IVPB 1000 mg/200 mL premix  Status:  Discontinued     1,000 mg 200 mL/hr over 60 Minutes Intravenous Every 24 hours 08/04/16 2230 08/05/16 0936   08/04/16 2230  aztreonam (AZACTAM) injection 2 g  Status:  Discontinued     2 g Intramuscular NOW 08/04/16 2230 08/04/16 2233   08/04/16 2130  ciprofloxacin (CIPRO) IVPB 400 mg     400 mg 200 mL/hr over 60 Minutes Intravenous  Once 08/04/16 2129 08/05/16 0056       Current Facility-Administered Medications  Medication Dose Route Frequency Provider Last Rate Last Dose  . 0.9 %  sodium chloride infusion   Intravenous Continuous Tawni Millers, MD 75 mL/hr at 08/05/16 2000    . acetaminophen (TYLENOL) tablet 650 mg  650 mg Oral Q6H PRN Ivor Costa, MD       Or  . acetaminophen (TYLENOL) suppository 650 mg  650 mg Rectal Q6H PRN Ivor Costa, MD      . ciprofloxacin (CIPRO) IVPB 400 mg  400 mg Intravenous Q24H Tawni Millers, MD   Stopped at 08/06/16 1254  . heparin ADULT infusion 100 units/mL (25000 units/240mL sodium chloride 0.45%)  1,150 Units/hr Intravenous Continuous Lynelle Doctor, RPH 11.5 mL/hr at 08/05/16 2213 1,150 Units/hr at 08/05/16 2213  . hydrALAZINE (APRESOLINE) injection 10 mg  10 mg Intravenous Once Ivor Costa, MD      . hydrALAZINE (APRESOLINE) injection 5 mg  5 mg Intravenous Q2H PRN Ivor Costa, MD      . insulin aspart (novoLOG) injection 0-5 Units  0-5 Units Subcutaneous QHS Ivor Costa, MD      . insulin aspart (novoLOG) injection 0-9 Units  0-9 Units Subcutaneous TID WC Ivor Costa, MD   1  Units at 08/05/16 1646  . MEDLINE mouth rinse  15 mL Mouth Rinse BID Ivor Costa, MD   15 mL at 08/06/16 0933  . ondansetron (ZOFRAN) injection 4 mg  4 mg Intravenous Q4H PRN Arrien, Jimmy Picket, MD   4 mg at 08/06/16 1345  . sodium bicarbonate tablet 650 mg  650 mg Oral Daily Arrien, Jimmy Picket, MD   650 mg at 08/06/16 0932  . sodium chloride flush (NS) 0.9 % injection 3 mL  3 mL Intravenous Q12H Ivor Costa, MD   3 mL at 08/06/16 0933     Objective: Vital signs in last 24 hours: Temp:  [98.2 F (36.8 C)-99.4 F (37.4 C)] 98.2 F (36.8 C) (07/12 1502) Pulse Rate:  [71-82] 72 (07/12 1502) Resp:  [14-19] 16 (07/12 1502) BP: (135-154)/(64-79) 141/78 (07/12 1502) SpO2:  [93 %-100 %] 100 % (07/12 1502) Weight:  [83.1 kg (183 lb 1.6 oz)] 83.1 kg (183 lb 1.6 oz) (07/12 1043)  Intake/Output from previous day: 07/11 0701 -  07/12 0700 In: 1848.8 [P.O.:240; I.V.:1608.8] Out: 1300 [Urine:1300] Intake/Output this shift: Total I/O In: 1358 [P.O.:120; I.V.:1038; IV Piggyback:200] Out: 900 [Urine:900]   Physical Exam  Lab Results:   Recent Labs  08/05/16 0319 08/06/16 0308  WBC 13.6* 9.6  HGB 8.8* 8.2*  HCT 26.0* 24.0*  PLT 180 185   BMET  Recent Labs  08/06/16 0308 08/06/16 1108  NA 142 141  K 4.5 4.6  CL 114* 113*  CO2 19* 19*  GLUCOSE 109* 120*  BUN 46* 45*  CREATININE 2.63* 2.57*  CALCIUM 8.4* 8.5*   PT/INR  Recent Labs  08/04/16 1404  LABPROT 14.0  INR 1.08   ABG No results for input(s): PHART, HCO3 in the last 72 hours.  Invalid input(s): PCO2, PO2  Studies/Results: Ct Abdomen Pelvis Wo Contrast  Result Date: 08/04/2016 CLINICAL DATA:  Diffuse abdominal pain.  Vomiting. EXAM: CT ABDOMEN AND PELVIS WITHOUT CONTRAST TECHNIQUE: Multidetector CT imaging of the abdomen and pelvis was performed following the standard protocol without IV contrast. Arms down positioning due to contractures. COMPARISON:  CT 1198 FINDINGS: Lower chest: Dependent lower lobe atelectasis, right greater than left. Coronary artery calcifications versus stents. Hepatobiliary: No focal hepatic lesion allowing for lack contrast and streak artifact from arms down positioning. Clips in the gallbladder fossa postcholecystectomy. No biliary dilatation. Pancreas: No ductal dilatation or inflammation. Spleen: Normal in size without focal abnormality. Adrenals/Urinary Tract: Low-density nodule on the right adrenal gland likely adenoma. Left adrenal gland is normal. Obstructing 3 mm stone in the distal right ureter with moderate proximal hydroureteronephrosis. Moderate perinephric edema. Multiple additional nonobstructing stones in the right kidney. There is a 9 x 5 mm stone in the left ureteropelvic junction with mild proximal hydronephrosis. At least 2 additional nonobstructing stones in the left kidney. More distal ureters  decompressed. Urinary bladder is decompressed by Foley catheter linear hyperdensity in the region the right urinary trigone appears similar to prior. Stomach/Bowel: Large volume of stool in the rectosigmoid colon with rectal distention of 8.4 cm. Rectal wall thickening and perirectal edema. Sigmoid colon is tortuous. Moderate stool in the more proximal colon. Sigmoid colonic diverticulosis without diverticulitis. Enteric sutures in the ascending colon/ cecum. No small bowel dilatation. Stomach is decompressed. Small hiatal hernia. Probable duodenal diverticulum. Vascular/Lymphatic: Aortic atherosclerosis and tortuosity. Decreased sensitivity for adenopathy given lack contrast, no bulky intra-abdominal or pelvic adenopathy is seen. Reproductive: Uterus not visualize, likely surgically absent. No adnexal mass. Other: No ascites or free  air. Multiple pelvic phleboliths. Scatter clips/coils in the mesentery. Musculoskeletal: Scoliosis and degenerative change in the spine. Heterotopic calcification about the right hip. Scattered subcutaneous calcifications about both flanks likely dystrophic. IMPRESSION: 1. Obstructing calculi in both renal collecting systems. On the left there is a 9 x 5 mm stone at the ureteropelvic junction with mild hydronephrosis. On the right there is an obstructing 3 x 4 mm stone at the ureterovesicular junction with moderate hydronephrosis and perinephric edema. 2. Large volume of stool in the rectosigmoid colon with rectal distention, rectal wall thickening and perirectal edema, suggesting fecal impaction. Colonic diverticulosis without diverticulitis. 3. Small hiatal hernia. 4. Aortic atherosclerosis. Electronically Signed   By: Jeb Levering M.D.   On: 08/04/2016 18:30   Dg C-arm 1-60 Min-no Report  Result Date: 08/04/2016 Fluoroscopy was utilized by the requesting physician.  No radiographic interpretation.     Assessment and Plan: Bilateral ureteral stones with obstruction and  sepsis now improving post stenting and medical therapy.   I will arrange ureteroscopy in the next 1-2 weeks for stone removal.       LOS: 2 days    Malka So 08/06/2016 [phone] ID: [name], female   DOB: [birth year], 66 y.o.   MRN: [id number]

## 2016-08-06 NOTE — Progress Notes (Signed)
Pharmacy Antibiotic Note  Michelle Holmes is a 66 y.o. female admitted on 08/04/2016 with UTI.  Blood cx now with GPC pairs with BCID = enterococcus (no VAN A/B gene detected) and abx were narrowed to Cipro.  ID consulted pharmacy to dose Vancomycin for enterococcus bacteremia, but also keeping cipro for Ecoli UTI pending sensitivity results.  SCr 2.57 with CrCl ~ 24 ml/min (Hx CKD) Afebrile, Tmax 99.2 WBC 9.6  Plan:  Continue Cipro 400mg  IV q24h  Vancomycin 1000 mg IV q24h.  Measure Vanc trough at steady state.  Follow up renal fxn, culture results, and clinical course.   Height: 5\' 6"  (167.6 cm) Weight: 183 lb 1.6 oz (83.1 kg) IBW/kg (Calculated) : 59.3  Temp (24hrs), Avg:98.8 F (37.1 C), Min:98.3 F (36.8 C), Max:99.4 F (37.4 C)   Recent Labs Lab 08/04/16 1404 08/04/16 1416 08/04/16 2204 08/05/16 0054 08/05/16 0319 08/05/16 0818 08/06/16 0308 08/06/16 1108  WBC 11.1*  --   --   --  13.6*  --  9.6  --   CREATININE 2.24* 2.20*  --   --  2.22*  --  2.63* 2.57*  LATICACIDVEN  --   --  1.73 2.4* 1.5 1.6  --   --     Estimated Creatinine Clearance: 23.7 mL/min (A) (by C-G formula based on SCr of 2.57 mg/dL (H)).    Allergies  Allergen Reactions  . Penicillins Hives and Swelling    ++ tolerates ceftriaxone++  Has patient had a PCN reaction causing immediate rash, facial/tongue/throat swelling, SOB or lightheadedness with hypotension: yes Has patient had a PCN reaction causing severe rash involving mucus membranes or skin necrosis: no Has patient had a PCN reaction that required hospitalization no Has patient had a PCN reaction occurring within the last 10 years: no If all of the above answers are "NO", then may proceed with Cephalosporin use.     Antimicrobials this admission: 7/10 cipro >> x1 7/11 azactam >> 7/11 7/11 CTX>> 7/11 7/11 vancomycin >> 7/12 Cipro >>  Dose adjustments this admission:   Microbiology results: 7/10 BCx x2: 2/2 GPC in chains  (BCID enterococcus) 7/10 UCx: Ecoli 7/10 MRSA PCR: neg 7/10 UA: large leuk, many bact  Thank you for allowing pharmacy to be a part of this patient's care.  Gretta Arab PharmD, BCPS Pager [phone] 08/06/2016 2:50 PM

## 2016-08-06 NOTE — Care Management Note (Signed)
Case Management Note  Patient Details  Name: Michelle Holmes MRN: [id number] Date of Birth: February 15, 1950  Subjective/Objective:                  [id number]/Preoperative diagnosis:  1. Bilateral obstructing stones 2.  Impending sepsis 3. Severe constipation 4. Altered mental status  Postoperative diagnosis:  1.  As above   Procedure:  1. Cystoscopy 2. bilateral ureteral stent placement 3. bilateral retrograde pyelography with interpretation    Action/Plan: Date:  August 06, 2016  Chart reviewed for concurrent status and case management needs.  Will continue to follow patient progress.  Discharge Planning: following for needs  Expected discharge date: [id number]  Velva Harman, BSN, [place], [place]   Expected Discharge Date:   (unknown)               Expected Discharge Plan:  Home/Self Care  In-House Referral:     Discharge planning Services  CM Consult  Post Acute Care Choice:    Choice offered to:     DME Arranged:    DME Agency:     HH Arranged:    [place] Agency:     Status of Service:  In process, will continue to follow  If discussed at Long Length of Stay Meetings, dates discussed:    Additional Comments:  Leeroy Cha, RN 08/06/2016, 9:29 AM

## 2016-08-07 ENCOUNTER — Inpatient Hospital Stay (HOSPITAL_COMMUNITY): Payer: Medicare Other

## 2016-08-07 LAB — GLUCOSE, CAPILLARY
Glucose-Capillary: 98 mg/dL (ref 65–99)
Glucose-Capillary: 144 mg/dL — ABNORMAL HIGH (ref 65–99)
Glucose-Capillary: 101 mg/dL — ABNORMAL HIGH (ref 65–99)
Glucose-Capillary: 121 mg/dL — ABNORMAL HIGH (ref 65–99)

## 2016-08-07 LAB — URINE CULTURE: Culture: >=100000 — AB

## 2016-08-07 LAB — CREATININE, SERUM
Creatinine, Ser: 2.56 mg/dL — ABNORMAL HIGH (ref 0.44–1.00)
GFR calc Af Amer: 22 mL/min — ABNORMAL LOW (ref >60)
GFR calc non Af Amer: 19 mL/min — ABNORMAL LOW (ref >60)

## 2016-08-07 LAB — CBC
HCT: 21.7 % — ABNORMAL LOW (ref 36.0–46.0)
Hemoglobin: 7.5 g/dL — ABNORMAL LOW (ref 12.0–15.0)
MCH: 31.6 pg (ref 26.0–34.0)
MCHC: 34.6 g/dL (ref 30.0–36.0)
MCV: 91.6 fL (ref 78.0–100.0)
Platelets: 193 10*3/uL (ref 150–400)
RBC: 2.37 MIL/uL — ABNORMAL LOW (ref 3.87–5.11)
RDW: 14.8 % (ref 11.5–15.5)
WBC: 6.9 10*3/uL (ref 4.0–10.5)

## 2016-08-07 LAB — HEPARIN LEVEL (UNFRACTIONATED): Heparin Unfractionated: 0.41 IU/mL (ref 0.30–0.70)

## 2016-08-07 MED ORDER — APIXABAN 2.5 MG PO TABS
2.5000 mg | ORAL_TABLET | Freq: Two times a day (BID) | ORAL | Status: DC
  Administered 2016-08-07: 2.5 mg via ORAL
  Filled 2016-08-07 (×2): qty 1

## 2016-08-07 MED ORDER — POLYETHYLENE GLYCOL 3350 17 G PO PACK
17.0000 g | PACK | Freq: Two times a day (BID) | ORAL | Status: DC
  Administered 2016-08-07 (×2): 17 g via ORAL
  Filled 2016-08-07 (×3): qty 1

## 2016-08-07 MED ORDER — CEPHALEXIN 250 MG PO CAPS
250.0000 mg | ORAL_CAPSULE | Freq: Two times a day (BID) | ORAL | Status: DC
  Administered 2016-08-07 – 2016-08-08 (×3): 250 mg via ORAL
  Filled 2016-08-07 (×3): qty 1

## 2016-08-07 MED ORDER — CEFAZOLIN SODIUM-DEXTROSE 1-4 GM/50ML-% IV SOLN
1.0000 g | Freq: Three times a day (TID) | INTRAVENOUS | Status: DC
Start: 2016-08-07 — End: 2016-08-07

## 2016-08-07 MED ORDER — RIVAROXABAN 10 MG PO TABS: 10.0000 mg | ORAL_TABLET | Freq: Two times a day (BID) | ORAL | Status: DC

## 2016-08-07 MED ORDER — VANCOMYCIN HCL IN DEXTROSE 1-5 GM/200ML-% IV SOLN
1000.0000 mg | INTRAVENOUS | Status: DC
  Administered 2016-08-07 – 2016-08-08 (×2): 1000 mg via INTRAVENOUS
  Filled 2016-08-07 (×2): qty 200

## 2016-08-07 MED ORDER — APIXABAN 5 MG PO TABS: 5.0000 mg | ORAL_TABLET | Freq: Two times a day (BID) | ORAL | Status: DC

## 2016-08-07 NOTE — Progress Notes (Signed)
PROGRESS NOTE    Michelle Holmes  UPJ:[id number] DOB: Jan 25, 1951 DOA: 08/04/2016 PCP: Lorella Nimrod, MD    Brief Narrative:  66 year old female presents with altered mental status, abdominal pain, nausea andvomiting. 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 Apparently patient has been not taking any medications at home. Patient developed abdominal pain, bilateral lower quadrants, severe in intensity, associated with nausea and vomiting, complicated by altered mentation. On initial physical examination blood pressure 150/83, heart 75, respiratory rate 11, oxygen saturation 100%. Her oral mucosa was moist, neck supple, lungs clear to auscultation bilaterally no wheezing rales or rhonchi, heart S1-S2 present rhythmic, her abdomen was tender in the lower quadrants, no rebound or peritoneal signs, lower extremity with no edema, she had right-sided hemiparesis. Sodium 142, potassium 4.5, chloride 114, bicarbonate 18, glucose 162, BUN 40, creatinine 2.24, calcium 9.1, white count 11.1, hemoglobin 10.2, hematocrit 30.8, platelets 219. Urinalysis with too numerous count white cells, CT of the abdomen showed obstructing calculi in both renal collecting system. On the left 95 mm stone at the ureteropelvic junction with mild hydronephrosis. On the right obstructing 34 mm stone at the ureterovesicular junction with moderate hydronephrosis and perinephric edema. Large volume of stool in the rectosigmoid colon suggesting fecal impaction. Chest x-ray hypoinflated, scoliosis, no infiltrates, effusions or pneumothorax. EKG normal sinus rhythm.  Patient was admitted to the stepdown unit working diagnosis of sepsis due to complicated urinary tract infection associated with obstructive uropathy   Assessment & Plan:   Principal Problem:  Complicated UTI (urinary tract infection) Active Problems:   OSA on CPAP   HLD (hyperlipidemia)   Acute metabolic encephalopathy   Dvt femoral (deep venous thrombosis) (HCC)   Essential hypertension   Stroke (cerebrum) (HCC)   CKD (chronic kidney disease), stage IV (HCC)   Hypertensive urgency   Kidney stones  1. Sepsis due to urinary tract infection, complicated with bilateral obstructive uropathy, present on admission. Patient status post bilateral stents placement, urine culture with E coli and blood cultures positive for enterococcus, pending sensitivities. Antibiotic therapy change to IV vancomycin due to bacteremia, will follow on echocardiography and follow on repeat blood cultures, will plan to discharge on linezolid po. Stable hematuria, will continue monitoring, patient on anticoagulation, heparin. Continue with cephalexin to cover urine e coli.   2. CKD stage IV. Stable renal function with cr at 2,37. Patient tolerating po well, will hold on IV fluids and will follow on renal panel in am.  3. Metabolic encephalopathy with history of CVA. Resolved, patient is back to her baseline, no confusion or agitation.  4. HTN. Blood pressure systolic 929 to 244, continue as needed hydralazine, holding amlodipine.    5. History of DVT with multifactorial anemia. No significant bleeding with heparin drip, will continue anticoagulation with apixaban due to reduced renal function, will avoid rivaroxaban. Hb dow to 7.5 from 9.6. Will continue close follow up, for now risk vs benefit will continue anticoagulation. Check iron stores. Holding blood transfusion.   6. T2DM. Continue glucose cover and monitoring with insulin sliding scale, capillary glucose 109, 163, 179, 98, 144. Patient tolerating po well, will hold on IV fluids.   DVT prophylaxis:heparin IV Code Status:Full  Family Communication:No family at the bedside  Disposition Plan:   Consultants:  Urology    Procedures:07/10 1. Cystoscopy 2. bilateralureteral stent placement 3. bilateralretrograde pyelography with interpretation  Antimicrobials:   Cephalexin  Vancomycin     Subjective: Patient feeling better, no dyspnea, no nausea or vomiting, no chest pain. Had no bowel movement.   Objective: Vitals:   08/06/16 0800 08/06/16 1043 08/06/16 1502 08/07/16 0400  BP: (!) 154/72 135/79 (!) 141/78 (!) 153/76  Pulse: 74 71 72 71  Resp: 16 16 16 20   Temp: 98.7 F (37.1 C) 99.2 F (37.3 C) 98.2 F (36.8 C) 98.9 F (37.2 C)  TempSrc: Oral Oral Oral Oral  SpO2: 98% 97% 100% 100%  Weight:  83.1 kg (183 lb 1.6 oz)    Height:  5\' 6"  (1.676 m)      Intake/Output Summary (Last 24 hours) at 08/07/16 1154 Last data filed at 08/07/16 1027  Gross per 24 hour  Intake             1223 ml  Output             2000 ml  Net             -777 ml   Filed Weights   08/04/16 1403 08/05/16 0018 08/06/16 1043  Weight: 81.6 kg (180 lb) 75 kg (165 lb 5.5 oz) 83.1 kg (183 lb 1.6 oz)    Examination:  General exam: deconditioned  E ENT: Mild pallor, no icterus, oral mucosa moist.  Respiratory system: Clear to auscultation. Respiratory effort normal. Cardiovascular system: S1 & S2 heard, RRR. No JVD, murmurs, rubs, gallops or clicks. Trace edema. Gastrointestinal system: Abdomen is nondistended, soft and nontender. No organomegaly or masses felt. Normal bowel sounds heard. Central nervous system: Alert and oriented. No focal neurological deficits. Extremities: Symmetric 5 x 5 power. Skin: No rashes, lesions or ulcers    Data Reviewed: I have personally reviewed following labs and imaging studies  CBC:  Recent Labs Lab 08/04/16 1404 08/04/16 1416 08/05/16 0319 08/06/16 0308 08/07/16 0343  WBC 11.1*  --  13.6* 9.6 6.9  NEUTROABS 9.9*  --   --   --   --   HGB 10.2* 9.5* 8.8* 8.2* 7.5*  HCT 30.8* 28.0* 26.0* 24.0* 21.7*  MCV 92.2  --  91.5 92.0 91.6  PLT 219  --  180 185  161   Basic Metabolic Panel:  Recent Labs Lab 08/04/16 1404 08/04/16 1416 08/05/16 0319 08/06/16 0308 08/06/16 1108 08/07/16 0343  NA 142 143 143 142 141  --   K 4.5 4.5 4.0 4.5 4.6  --   CL 114* 113* 116* 114* 113*  --   CO2 18*  --  16* 19* 19*  --   GLUCOSE 162* 159* 196* 109* 120*  --   BUN 40* 49* 41* 46* 45*  --   CREATININE 2.24* 2.20* 2.22* 2.63* 2.57* 2.56*  CALCIUM 9.1  --  7.6* 8.4* 8.5*  --    GFR: Estimated Creatinine Clearance: 23.8 mL/min (A) (by C-G formula based on SCr of 2.56 mg/dL (H)). Liver Function Tests:  Recent Labs Lab 08/04/16 1404  AST 16  ALT 8*  ALKPHOS 53  BILITOT 0.8  PROT 6.4*  ALBUMIN 3.5   No results for input(s): LIPASE, AMYLASE in the last 168 hours. No results for input(s): AMMONIA in the last 168 hours. Coagulation Profile:  Recent Labs Lab 08/04/16 1404  INR 1.08   Cardiac Enzymes:  Recent Labs Lab 08/05/16 0054 08/05/16 0818 08/05/16 1029  TROPONINI <0.03 <0.03 <0.03   BNP (last 3 results) No results for input(s): PROBNP in the last 8760 hours. HbA1C:  No results for input(s): HGBA1C in the last 72 hours. CBG:  Recent Labs Lab 08/06/16 0818 08/06/16 1738 08/06/16 2006 08/07/16 0749 08/07/16 1138  GLUCAP 109* 163* 179* 98 144*   Lipid Profile: No results for input(s): CHOL, HDL, LDLCALC, TRIG, CHOLHDL, LDLDIRECT in the last 72 hours. Thyroid Function Tests: No results for input(s): TSH, T4TOTAL, FREET4, T3FREE, THYROIDAB in the last 72 hours. Anemia Panel: No results for input(s): VITAMINB12, FOLATE, FERRITIN, TIBC, IRON, RETICCTPCT in the last 72 hours. Sepsis Labs:  Recent Labs Lab 08/04/16 2204 08/05/16 0054 08/05/16 0319 08/05/16 0818  PROCALCITON  --   --  1.71  --   LATICACIDVEN 1.73 2.4* 1.5 1.6    Recent Results (from the past 240 hour(s))  Urine Culture     Status: Abnormal   Collection Time: 08/04/16  7:52 PM  Result Value Ref Range Status   Specimen Description URINE,  CATHETERIZED  Final   Special Requests NONE  Final   Culture >=100,000 COLONIES/mL ESCHERICHIA COLI (A)  Final   Report Status 08/07/2016 FINAL  Final   Organism ID, Bacteria ESCHERICHIA COLI (A)  Final      Susceptibility   Escherichia coli - MIC*    AMPICILLIN >=32 RESISTANT Resistant     CEFAZOLIN <=4 SENSITIVE Sensitive     CEFTRIAXONE <=1 SENSITIVE Sensitive     CIPROFLOXACIN >=4 RESISTANT Resistant     GENTAMICIN <=1 SENSITIVE Sensitive     IMIPENEM <=0.25 SENSITIVE Sensitive     NITROFURANTOIN <=16 SENSITIVE Sensitive     TRIMETH/SULFA >=320 RESISTANT Resistant     AMPICILLIN/SULBACTAM 16 INTERMEDIATE Intermediate     PIP/TAZO <=4 SENSITIVE Sensitive     Extended ESBL NEGATIVE Sensitive     * >=100,000 COLONIES/mL ESCHERICHIA COLI  Culture, blood (routine x 2)     Status: Abnormal (Preliminary result)   Collection Time: 08/04/16  9:53 PM  Result Value Ref Range Status   Specimen Description BLOOD LEFT ANTECUBITAL  Final   Special Requests   Final    BOTTLES DRAWN AEROBIC AND ANAEROBIC Blood Culture adequate volume   Culture  Setup Time   Final    GRAM POSITIVE COCCI IN CHAINS IN BOTH AEROBIC AND ANAEROBIC BOTTLES CRITICAL RESULT CALLED TO, READ BACK BY AND VERIFIED WITH: George, PHARMD 08/05/16 1512 L CHAMPION    Culture (A)  Final    ENTEROCOCCUS FAECALIS SUSCEPTIBILITIES TO FOLLOW Performed at [facility], [address], [place], [place] [address]    Report Status PENDING  Incomplete  Blood Culture ID Panel (Reflexed)     Status: Abnormal   Collection Time: 08/04/16  9:53 PM  Result Value Ref Range Status   Enterococcus species DETECTED (A) NOT DETECTED Final    Comment: CRITICAL RESULT CALLED TO, READ BACK BY AND VERIFIED WITH: NICK GLOGOVAC, PHARMD 08/05/16 1512 L CHAMPION    Vancomycin resistance NOT DETECTED NOT DETECTED Final   Listeria monocytogenes NOT DETECTED NOT DETECTED Final   Staphylococcus species NOT DETECTED NOT DETECTED Final    Staphylococcus aureus NOT DETECTED NOT DETECTED Final   Streptococcus species NOT DETECTED NOT DETECTED Final   Streptococcus agalactiae NOT DETECTED NOT DETECTED Final   Streptococcus pneumoniae NOT DETECTED NOT DETECTED Final   Streptococcus pyogenes NOT DETECTED NOT DETECTED Final   Acinetobacter baumannii NOT DETECTED NOT DETECTED Final   Enterobacteriaceae species NOT DETECTED NOT DETECTED Final   Enterobacter cloacae complex NOT DETECTED NOT DETECTED Final   Escherichia coli NOT DETECTED NOT DETECTED  Final   Klebsiella oxytoca NOT DETECTED NOT DETECTED Final   Klebsiella pneumoniae NOT DETECTED NOT DETECTED Final   Proteus species NOT DETECTED NOT DETECTED Final   Serratia marcescens NOT DETECTED NOT DETECTED Final   Haemophilus influenzae NOT DETECTED NOT DETECTED Final   Neisseria meningitidis NOT DETECTED NOT DETECTED Final   Pseudomonas aeruginosa NOT DETECTED NOT DETECTED Final   Candida albicans NOT DETECTED NOT DETECTED Final   Candida glabrata NOT DETECTED NOT DETECTED Final   Candida krusei NOT DETECTED NOT DETECTED Final   Candida parapsilosis NOT DETECTED NOT DETECTED Final   Candida tropicalis NOT DETECTED NOT DETECTED Final  Culture, blood (routine x 2)     Status: Abnormal (Preliminary result)   Collection Time: 08/04/16 10:02 PM  Result Value Ref Range Status   Specimen Description BLOOD BLOOD LEFT FOREARM  Final   Special Requests   Final    BOTTLES DRAWN AEROBIC AND ANAEROBIC Blood Culture adequate volume   Culture  Setup Time   Final    GRAM POSITIVE COCCI IN CHAINS IN BOTH AEROBIC AND ANAEROBIC BOTTLES CRITICAL VALUE NOTED.  VALUE IS CONSISTENT WITH PREVIOUSLY REPORTED AND CALLED VALUE.    Culture ENTEROCOCCUS FAECALIS (A)  Final   Report Status PENDING  Incomplete  Urine Culture     Status: None (Preliminary result)   Collection Time: 08/04/16 11:11 PM  Result Value Ref Range Status   Specimen Description CYSTOSCOPY  Final   Special Requests NONE  Final    Culture   Final    CULTURE REINCUBATED FOR BETTER GROWTH Performed at [facility], [place] [address], [place], [place] [address]    Report Status PENDING  Incomplete  MRSA PCR Screening     Status: None   Collection Time: 08/05/16 12:09 AM  Result Value Ref Range Status   MRSA by PCR NEGATIVE NEGATIVE Final    Comment:        The GeneXpert MRSA Assay (FDA approved for NASAL specimens only), is one component of a comprehensive MRSA colonization surveillance program. It is not intended to diagnose MRSA infection nor to guide or monitor treatment for MRSA infections.   Culture, blood (routine x 2)     Status: None (Preliminary result)   Collection Time: 08/06/16  3:27 PM  Result Value Ref Range Status   Specimen Description BLOOD BLOOD RIGHT HAND  Final   Special Requests   Final    BOTTLES DRAWN AEROBIC ONLY Blood Culture adequate volume   Culture   Final    NO GROWTH < 24 HOURS Performed at [facility], [address], [place], [place] [address]    Report Status PENDING  Incomplete  Culture, blood (routine x 2)     Status: None (Preliminary result)   Collection Time: 08/06/16  3:27 PM  Result Value Ref Range Status   Specimen Description BLOOD BLOOD LEFT HAND  Final   Special Requests IN PEDIATRIC BOTTLE Blood Culture adequate volume  Final   Culture   Final    NO GROWTH < 24 HOURS Performed at [facility], [place] [address], [place], [place] [address]    Report Status PENDING  Incomplete         Radiology Studies: No results found.      Scheduled Meds: . cephALEXin  250 mg Oral Q12H  . hydrALAZINE  10 mg Intravenous Once  . insulin aspart  0-5 Units Subcutaneous QHS  . insulin aspart  0-9 Units Subcutaneous  TID WC  . mouth rinse  15 mL Mouth Rinse BID  . sodium bicarbonate  650 mg Oral Daily  . sodium chloride flush  3 mL Intravenous Q12H   Continuous Infusions: . sodium chloride 75 mL/hr at 08/07/16 0955  . heparin 1,150 Units/hr  (08/06/16 2110)  . vancomycin Stopped (08/07/16 1104)     LOS: 3 days   \   Jevante Hollibaugh Gerome Apley, MD [facility] Pager [phone]  If 7PM-7AM, please contact night-coverage [email or web address] Password [facility] 08/07/2016, 11:54 AM

## 2016-08-07 NOTE — Progress Notes (Signed)
Pharmacy Antibiotic Note  Michelle Holmes is a 66 y.o. female admitted on 08/04/2016 with UTI.  Antibiotics were narrowed to cipro alone, but Blood cx with enterococcus faecalis and Urine culture with Ecoli now showing resitance to cipro.  Antibiotics changed to Keflex per MD for UTI and Vancomycin for enterococcus bacteremia.    SCr 2.56 with CrCl ~ 24 ml/min (Hx CKD) Afebrile, Tmax 99.2, currently 98.9 WBC decreased to 6.9  Last vancomycin dose given on 7/11, orders on 7/12 were d/c prior to doses given.  Plan:  Change to Keflex per MD  Resume Vancomycin 1000 mg IV q24h.   Measure Vanc trough at steady state.  Follow up renal fxn, culture results, and clinical course.   Height: 5\' 6"  (167.6 cm) Weight: 183 lb 1.6 oz (83.1 kg) IBW/kg (Calculated) : 59.3  Temp (24hrs), Avg:98.8 F (37.1 C), Min:98.2 F (36.8 C), Max:99.2 F (37.3 C)   Recent Labs Lab 08/04/16 1404 08/04/16 1416 08/04/16 2204 08/05/16 0054 08/05/16 0319 08/05/16 0818 08/06/16 0308 08/06/16 1108 08/07/16 0343  WBC 11.1*  --   --   --  13.6*  --  9.6  --  6.9  CREATININE 2.24* 2.20*  --   --  2.22*  --  2.63* 2.57* 2.56*  LATICACIDVEN  --   --  1.73 2.4* 1.5 1.6  --   --   --     Estimated Creatinine Clearance: 23.8 mL/min (A) (by C-G formula based on SCr of 2.56 mg/dL (H)).    Allergies  Allergen Reactions  . Penicillins Hives and Swelling    ++ tolerates ceftriaxone++  Has patient had a PCN reaction causing immediate rash, facial/tongue/throat swelling, SOB or lightheadedness with hypotension: yes Has patient had a PCN reaction causing severe rash involving mucus membranes or skin necrosis: no Has patient had a PCN reaction that required hospitalization no Has patient had a PCN reaction occurring within the last 10 years: no If all of the above answers are "NO", then may proceed with Cephalosporin use.     Antimicrobials this admission: 7/10 cipro >> x1 7/11 azactam >> 7/11 7/11 CTX>>  7/11 7/11 vancomycin >>7/11, resumed 7/13 >> 7/12 Cipro >> 7/13 7/13 Keflex >>  Dose adjustments this admission:   Microbiology results: 7/10 BCx x2: 2/2 Enterococcus Faecalis (sens pending)         7/11 BCID enterococcus 7/10 UCx: Ecoli (Sens: cefazolin, CTX, gent, imipenem, ntf, pip/tazo.  Resistant: amp, cipro, bactrim) 7/10 MRSA PCR: neg  Thank you for allowing pharmacy to be a part of this patient's care.  Gretta Arab PharmD, BCPS Pager [phone] 08/07/2016 9:47 AM

## 2016-08-07 NOTE — Progress Notes (Signed)
[facility]   Reason for visit: Follow up on Enterococcal bacteremia  Interval History: urine culture withEnterococcus, E coli on previous culture.    Physical Exam: Constitutional:  Vitals:   08/06/16 1502 08/07/16 0400  BP: (!) 141/78 (!) 153/76  Pulse: 72 71  Resp: 16 20  Temp: 98.2 F (36.8 C) 98.9 F (37.2 C)   patient appears in NAD Respiratory: Normal respiratory effort; CTA B Cardiovascular: RRR GI: soft, nt, nd  Review of Systems: Gastrointestinal: negative for diarrhea Integument/breast: negative for rash  Lab Results  Component Value Date   WBC 6.9 08/07/2016   HGB 7.5 (L) 08/07/2016   HCT 21.7 (L) 08/07/2016   MCV 91.6 08/07/2016   PLT 193 08/07/2016    Lab Results  Component Value Date   CREATININE 2.56 (H) 08/07/2016   BUN 45 (H) 08/06/2016   NA 141 08/06/2016   K 4.6 08/06/2016   CL 113 (H) 08/06/2016   CO2 19 (L) 08/06/2016    Lab Results  Component Value Date   ALT 8 (L) 08/04/2016   AST 16 08/04/2016   ALKPHOS 53 08/04/2016     Microbiology: Recent Results (from the past 240 hour(s))  Urine Culture     Status: Abnormal   Collection Time: 08/04/16  7:52 PM  Result Value Ref Range Status   Specimen Description URINE, CATHETERIZED  Final   Special Requests NONE  Final   Culture >=100,000 COLONIES/mL ESCHERICHIA COLI (A)  Final   Report Status 08/07/2016 FINAL  Final   Organism ID, Bacteria ESCHERICHIA COLI (A)  Final      Susceptibility   Escherichia coli - MIC*    AMPICILLIN >=32 RESISTANT Resistant     CEFAZOLIN <=4 SENSITIVE Sensitive     CEFTRIAXONE <=1 SENSITIVE Sensitive     CIPROFLOXACIN >=4 RESISTANT Resistant     GENTAMICIN <=1 SENSITIVE Sensitive     IMIPENEM <=0.25 SENSITIVE Sensitive     NITROFURANTOIN <=16 SENSITIVE Sensitive     TRIMETH/SULFA >=320 RESISTANT Resistant     AMPICILLIN/SULBACTAM 16 INTERMEDIATE Intermediate     PIP/TAZO <=4 SENSITIVE Sensitive     Extended ESBL NEGATIVE  Sensitive     * >=100,000 COLONIES/mL ESCHERICHIA COLI  Culture, blood (routine x 2)     Status: Abnormal (Preliminary result)   Collection Time: 08/04/16  9:53 PM  Result Value Ref Range Status   Specimen Description BLOOD LEFT ANTECUBITAL  Final   Special Requests   Final    BOTTLES DRAWN AEROBIC AND ANAEROBIC Blood Culture adequate volume   Culture  Setup Time   Final    GRAM POSITIVE COCCI IN CHAINS IN BOTH AEROBIC AND ANAEROBIC BOTTLES CRITICAL RESULT CALLED TO, READ BACK BY AND VERIFIED WITH: [place], PHARMD 08/05/16 1512 L CHAMPION    Culture (A)  Final    ENTEROCOCCUS FAECALIS SUSCEPTIBILITIES TO FOLLOW Performed at [facility], [address], [place], [place] [address]    Report Status PENDING  Incomplete  Blood Culture ID Panel (Reflexed)     Status: Abnormal   Collection Time: 08/04/16  9:53 PM  Result Value Ref Range Status   Enterococcus species DETECTED (A) NOT DETECTED Final    Comment: CRITICAL RESULT CALLED TO, READ BACK BY AND VERIFIED WITH: NICK GLOGOVAC, PHARMD 08/05/16 1512 L CHAMPION    Vancomycin resistance NOT DETECTED NOT DETECTED Final   Listeria monocytogenes NOT DETECTED NOT DETECTED Final   Staphylococcus species NOT DETECTED NOT DETECTED  Final   Staphylococcus aureus NOT DETECTED NOT DETECTED Final   Streptococcus species NOT DETECTED NOT DETECTED Final   Streptococcus agalactiae NOT DETECTED NOT DETECTED Final   Streptococcus pneumoniae NOT DETECTED NOT DETECTED Final   Streptococcus pyogenes NOT DETECTED NOT DETECTED Final   Acinetobacter baumannii NOT DETECTED NOT DETECTED Final   Enterobacteriaceae species NOT DETECTED NOT DETECTED Final   Enterobacter cloacae complex NOT DETECTED NOT DETECTED Final   Escherichia coli NOT DETECTED NOT DETECTED Final   Klebsiella oxytoca NOT DETECTED NOT DETECTED Final   Klebsiella pneumoniae NOT DETECTED NOT DETECTED Final   Proteus species NOT DETECTED NOT DETECTED Final   Serratia marcescens NOT  DETECTED NOT DETECTED Final   Haemophilus influenzae NOT DETECTED NOT DETECTED Final   Neisseria meningitidis NOT DETECTED NOT DETECTED Final   Pseudomonas aeruginosa NOT DETECTED NOT DETECTED Final   Candida albicans NOT DETECTED NOT DETECTED Final   Candida glabrata NOT DETECTED NOT DETECTED Final   Candida krusei NOT DETECTED NOT DETECTED Final   Candida parapsilosis NOT DETECTED NOT DETECTED Final   Candida tropicalis NOT DETECTED NOT DETECTED Final  Culture, blood (routine x 2)     Status: Abnormal (Preliminary result)   Collection Time: 08/04/16 10:02 PM  Result Value Ref Range Status   Specimen Description BLOOD BLOOD LEFT FOREARM  Final   Special Requests   Final    BOTTLES DRAWN AEROBIC AND ANAEROBIC Blood Culture adequate volume   Culture  Setup Time   Final    GRAM POSITIVE COCCI IN CHAINS IN BOTH AEROBIC AND ANAEROBIC BOTTLES CRITICAL VALUE NOTED.  VALUE IS CONSISTENT WITH PREVIOUSLY REPORTED AND CALLED VALUE.    Culture ENTEROCOCCUS FAECALIS (A)  Final   Report Status PENDING  Incomplete  Urine Culture     Status: Abnormal (Preliminary result)   Collection Time: 08/04/16 11:11 PM  Result Value Ref Range Status   Specimen Description CYSTOSCOPY  Final   Special Requests NONE  Final   Culture (A)  Final    >=100,000 COLONIES/mL ENTEROCOCCUS FAECALIS 2,000 COLONIES/mL ESCHERICHIA COLI SUSCEPTIBILITIES TO FOLLOW Performed at [facility], [address] [place], [place] [address]    Report Status PENDING  Incomplete  MRSA PCR Screening     Status: None   Collection Time: 08/05/16 12:09 AM  Result Value Ref Range Status   MRSA by PCR NEGATIVE NEGATIVE Final    Comment:        The GeneXpert MRSA Assay (FDA approved for NASAL specimens only), is one component of a comprehensive MRSA colonization surveillance program. It is not intended to diagnose MRSA infection nor to guide or monitor treatment for MRSA infections.   Culture, blood (routine x 2)      Status: None (Preliminary result)   Collection Time: 08/06/16  3:27 PM  Result Value Ref Range Status   Specimen Description BLOOD BLOOD RIGHT HAND  Final   Special Requests   Final    BOTTLES DRAWN AEROBIC ONLY Blood Culture adequate volume   Culture   Final    NO GROWTH < 24 HOURS Performed at [facility], [address] [place],  [address]    Report Status PENDING  Incomplete  Culture, blood (routine x 2)     Status: None (Preliminary result)   Collection Time: 08/06/16  3:27 PM  Result Value Ref Range Status   Specimen Description BLOOD BLOOD LEFT HAND  Final   Special Requests IN PEDIATRIC BOTTLE Blood Culture adequate volume  Final   Culture   Final    NO GROWTH < 24 HOURS Performed at [facility], [place] [address] [place], [place] [address]    Report Status PENDING  Incomplete    Impression/Plan:  1. Obstructing stones.  E coli and Enterococcus.  I would treat with keflex and linezolid for 2 weeks until stone removal.  Use vancomycin while inpatient to limit duration of linezolid  2. Enterococcal bacteremia - repeat cultures ngtd, echo done but no report.  No further work up if blood cultures remain negative and no vegetation on TTE.  Can follow blood cultures as an outpatient if she is discharged.    At this time, I will sign off

## 2016-08-07 NOTE — Progress Notes (Addendum)
ANTICOAGULATION CONSULT NOTE - Follow Up Consult  Pharmacy Consult for heparin Indication: hx DVT  Allergies  Allergen Reactions  . Penicillins Hives and Swelling    ++ tolerates ceftriaxone++  Has patient had a PCN reaction causing immediate rash, facial/tongue/throat swelling, SOB or lightheadedness with hypotension: yes Has patient had a PCN reaction causing severe rash involving mucus membranes or skin necrosis: no Has patient had a PCN reaction that required hospitalization no Has patient had a PCN reaction occurring within the last 10 years: no If all of the above answers are "NO", then may proceed with Cephalosporin use.     Patient Measurements: Height: 5\' 6"  (167.6 cm) Weight: 183 lb 1.6 oz (83.1 kg) IBW/kg (Calculated) : 59.3 Heparin Dosing Weight: 72 kg  Vital Signs: Temp: 98.9 F (37.2 C) (07/13 0400) Temp Source: Oral (07/13 0400) BP: 153/76 (07/13 0400) Pulse Rate: 71 (07/13 0400)  Labs:  Recent Labs  08/04/16 1404  08/05/16 0054  08/05/16 0319 08/05/16 0818 08/05/16 1029 08/05/16 1945 08/06/16 0308 08/06/16 1108 08/07/16 0343  HGB 10.2*  < >  --   --  8.8*  --   --   --  8.2*  --  7.5*  HCT 30.8*  < >  --   --  26.0*  --   --   --  24.0*  --  21.7*  PLT 219  --   --   --  180  --   --   --  185  --  193  APTT 32  --   --   --   --   --   --   --   --   --   --   LABPROT 14.0  --   --   --   --   --   --   --   --   --   --   INR 1.08  --   --   --   --   --   --   --   --   --   --   HEPARINUNFRC  --   --   --   < >  --   --  0.21* 0.42 0.34  --  0.41  CREATININE 2.24*  < >  --   --  2.22*  --   --   --  2.63* 2.57* 2.56*  TROPONINI  --   --  <0.03  --   --  <0.03 <0.03  --   --   --   --   < > = values in this interval not displayed.  Estimated Creatinine Clearance: 23.8 mL/min (A) (by C-G formula based on SCr of 2.56 mg/dL (H)).   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  Abd CT showed bilateral obstructing stones-- s/p cystoscopy with bilateral ureteral stent placement on 08/04/16.  Xarelto placed on hold since admission and pt was transitioned to heparin drip.  Today, 08/07/2016: - Heparin level remains therapeutic(=0.41) with rate at 1150 units/hr - Hgb decreased to 7.5 from 8.2, platelets WNL and stable - Urine documented as "Red, Bloody" since 7/11 at 8pm.   - No other bleeding documented besides urine  Goal of Therapy:  Heparin level  0.3-0.7 units/ml Monitor platelets by anticoagulation protocol: Yes   Plan:  - Continue heparin drip at 1150 units/hr - Daily heparin level and CBC - monitor for s/s bleeding - f/u plan for resuming [place] PharmD, [facility] Pager [phone] 08/07/2016 6:58 AM

## 2016-08-07 NOTE — Progress Notes (Signed)
  Echocardiogram 2D Echocardiogram has been performed.  Darlina Sicilian M 08/07/2016, 3:02 PM

## 2016-08-08 LAB — CBC WITH DIFFERENTIAL/PLATELET
Basophils Absolute: 0 10*3/uL (ref 0.0–0.1)
Basophils Relative: 0 %
Eosinophils Absolute: 0.2 10*3/uL (ref 0.0–0.7)
Eosinophils Relative: 3 %
HCT: 23 % — ABNORMAL LOW (ref 36.0–46.0)
Hemoglobin: 8 g/dL — ABNORMAL LOW (ref 12.0–15.0)
Lymphocytes Relative: 35 %
Lymphs Abs: 2.1 10*3/uL (ref 0.7–4.0)
MCH: 31.3 pg (ref 26.0–34.0)
MCHC: 34.8 g/dL (ref 30.0–36.0)
MCV: 89.8 fL (ref 78.0–100.0)
Monocytes Absolute: 0.4 10*3/uL (ref 0.1–1.0)
Monocytes Relative: 7 %
Neutro Abs: 3.2 10*3/uL (ref 1.7–7.7)
Neutrophils Relative %: 55 %
Platelets: 194 10*3/uL (ref 150–400)
RBC: 2.56 MIL/uL — ABNORMAL LOW (ref 3.87–5.11)
RDW: 14.6 % (ref 11.5–15.5)
WBC: 5.9 10*3/uL (ref 4.0–10.5)

## 2016-08-08 LAB — IRON AND TIBC
Iron: 31 ug/dL (ref 28–170)
Saturation Ratios: 18 % (ref 10.4–31.8)
TIBC: 172 ug/dL — ABNORMAL LOW (ref 250–450)
UIBC: 141 ug/dL

## 2016-08-08 LAB — BASIC METABOLIC PANEL
Anion gap: 8 (ref 5–15)
BUN: 35 mg/dL — ABNORMAL HIGH (ref 6–20)
CO2: 20 mmol/L — ABNORMAL LOW (ref 22–32)
Calcium: 8.6 mg/dL — ABNORMAL LOW (ref 8.9–10.3)
Chloride: 114 mmol/L — ABNORMAL HIGH (ref 101–111)
Creatinine, Ser: 2.21 mg/dL — ABNORMAL HIGH (ref 0.44–1.00)
GFR calc Af Amer: 26 mL/min — ABNORMAL LOW (ref >60)
GFR calc non Af Amer: 22 mL/min — ABNORMAL LOW (ref >60)
Glucose, Bld: 96 mg/dL (ref 65–99)
Potassium: 4.3 mmol/L (ref 3.5–5.1)
Sodium: 142 mmol/L (ref 135–145)

## 2016-08-08 LAB — CULTURE, BLOOD (ROUTINE X 2)
Special Requests: ADEQUATE
Special Requests: ADEQUATE

## 2016-08-08 LAB — TRANSFERRIN: Transferrin: 123 mg/dL — ABNORMAL LOW (ref 192–382)

## 2016-08-08 LAB — GLUCOSE, CAPILLARY
Glucose-Capillary: 89 mg/dL (ref 65–99)
Glucose-Capillary: 108 mg/dL — ABNORMAL HIGH (ref 65–99)

## 2016-08-08 LAB — FERRITIN: Ferritin: 151 ng/mL (ref 11–307)

## 2016-08-08 MED ORDER — POLYETHYLENE GLYCOL 3350 17 GM/SCOOP PO POWD: 17.0000 g | Freq: Every day | ORAL | 0 refills | Status: DC

## 2016-08-08 MED ORDER — AMLODIPINE BESYLATE 5 MG PO TABS: 5.0000 mg | ORAL_TABLET | Freq: Every day | ORAL | 0 refills | Status: DC

## 2016-08-08 MED ORDER — CEPHALEXIN 250 MG PO CAPS: 250.0000 mg | ORAL_CAPSULE | Freq: Two times a day (BID) | ORAL | 0 refills | Status: DC

## 2016-08-08 MED ORDER — APIXABAN 2.5 MG PO TABS
2.5000 mg | ORAL_TABLET | Freq: Two times a day (BID) | ORAL | 0 refills | Status: DC
Start: 2016-08-08 — End: 2016-08-18

## 2016-08-08 MED ORDER — LINEZOLID 600 MG PO TABS: 600.0000 mg | ORAL_TABLET | Freq: Two times a day (BID) | ORAL | 0 refills | Status: DC

## 2016-08-08 MED ORDER — ATORVASTATIN CALCIUM 80 MG PO TABS: 80.0000 mg | ORAL_TABLET | Freq: Every day | ORAL | 0 refills | Status: DC

## 2016-08-08 NOTE — Care Management Note (Signed)
Pt is ready to be D/C today. Received a call from RN regarding [place] and pt needs an ambulance to transport her home. Met with pt. She is returning home with the support of her cousins. She is active with [facility] [place] and arranged ambulance.

## 2016-08-08 NOTE — Discharge Summary (Signed)
Physician Discharge Summary  Michelle Holmes AUQ:[id number] DOB: 10-07-1950 DOA: 08/04/2016  PCP: Lorella Nimrod, MD  Admit date: 08/04/2016 Discharge date: 08/08/2016  Admitted From: Home  Disposition:  Home   Recommendations for Outpatient Follow-up:  1. Follow up with PCP in 1-weeks 2. Follow with urology in 2 weeks for stone removal 3. Placed on cephalexin and linezolid for the next 11 days. 4. Enterococcus sensitivities pending 5. Echocardiogram result pending 6. Ureteroscopy in the next 1-2 weeks for stone removal.  7. Change Foley catheter every 4 weeks  Home Health: Yes  Equipment/Devices: NA   Discharge Condition: Stable  CODE STATUS: Full  Diet recommendation: Heart Healthy / Carb Modified   Brief/Interim Summary: 66 year old female presents with altered mental status, abdominal pain, nausea andvomiting. 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 Apparently patient has been not taking any medications at home. Patient developed abdominal pain, bilateral lower quadrants, severe in intensity, associated with nausea and vomiting, complicated by altered mentation. On initial physical examination blood pressure 150/83, heart rate 75, respiratory rate 11, oxygen saturation 100%. Her oral mucosa was moist, neck supple, lungs clear to auscultation bilaterally no wheezing rales or rhonchi, heart S1-S2 present rhythmic, her abdomen was tender in the lower quadrants, no rebound or peritoneal signs, lower extremity with no edema, she had right-sided hemiparesis. Sodium 142, potassium 4.5, chloride 114, bicarbonate 18, glucose 162, BUN 40, creatinine 2.24, calcium 9.1, white count 11.1, hemoglobin 10.2, hematocrit 30.8, platelets 219. Urinalysis with too numerous count white cells, CT of the abdomen showed  obstructing calculi in both renal collecting system. On the left 95 mm stone at the ureteropelvic junction with mild hydronephrosis. On the right obstructing 34 mm stone at the ureterovesicular junction with moderate hydronephrosis and perinephric edema. Large volume of stool in the rectosigmoid colon suggesting fecal impaction. Chest x-ray hypoinflated, scoliosis, no infiltrates, effusions or pneumothorax. EKG normal sinus rhythm.  Patient was admitted to the stepdown unit working diagnosis of sepsis due to complicated urinary tract infection associated with obstructive uropathy.   1. Sepsis due to urinary tract infection, complicated with bilateral obstructive uropathy, present on admission, in the setting of chronic indwelling Foley catheter. Patient was admitted to the stepdown unit, she was placed on broad spectrum antibiotics. She was seen by urology and underwent cystoscopy with bilateral ureteral stent placement, bilateral retrograde pyelography with interpretation. Blood culture grew Enterococcus faecalis and urine culture grew Escherichia coli. Antibiotic therapy was narrowed to IV vancomycin and oral cephalexin. Patient will be discharged home with linezolid and cephalexin to complete 14 days of therapy, with plan to remove stones as an outpatient by urology. Home health at discharge, recommend change Foley catheter every 4 weeks. Echocardiogram report still pending. Low pretest probability for endocarditis, will follow-up on report.  2. Chronic kidney disease stage IV. Patient kidney function remained stable, these creatinine around 2.3. Patient received IV fluids with good toleration. A restrictive IV fluid strategy was pursued. Discharge creatinine 2.21 with a serum bicarbonate of 20, serum potassium 4.3 and chloride 114, serum sodium 142.   3. Hypertension. Blood pressure remained well-controlled during her hospitalization, she will resume amlodipine by the time of discharge.   4. Type 2  diabetes mellitus. Patient was placed on insulin sliding scale for glucose coverage and monitoring, her capillary glucose remained well-controlled, she will resume her insulin sliding scale  at home.  5. History of deep vein thrombosis, and multifactorial anemia. Patient was placed on heparin drip during the first days of hospitalization, then she was transitioned to apixaban, avoid rivaroxaban due to decreased GFR. Patient will need a close follow-up as an outpatient. She did have mild hematuria postprocedure. Her discharge hemoglobin is 8.0.   6. Metabolic encephalopathy with history of CVA. Altered mentation due to sepsis, clinically improved, she does have a chronic right hemiparesis. Home health has been arranged. Continue statin therapy. No antiplatelet therapy with risk of bleeding.  Discharge Diagnoses:  Principal Problem:   Complicated UTI (urinary tract infection) Active Problems:   OSA on CPAP   HLD (hyperlipidemia)   Acute metabolic encephalopathy   Dvt femoral (deep venous thrombosis) (HCC)   Essential hypertension   Stroke (cerebrum) (HCC)   CKD (chronic kidney disease), stage IV (HCC)   Hypertensive urgency   Kidney stones    Discharge Instructions   Allergies as of 08/08/2016      Reactions   Penicillins Hives, Swelling   ++ tolerates ceftriaxone++ Has patient had a PCN reaction causing immediate rash, facial/tongue/throat swelling, SOB or lightheadedness with hypotension: yes Has patient had a PCN reaction causing severe rash involving mucus membranes or skin necrosis: no Has patient had a PCN reaction that required hospitalization no Has patient had a PCN reaction occurring within the last 10 years: no If all of the above answers are "NO", then may proceed with Cephalosporin use.      Medication List    STOP taking these medications   LYRICA 50 MG capsule Generic drug:  pregabalin   Oxycodone HCl 20 MG Tabs   rivaroxaban 10 MG Tabs tablet Commonly known  as:  XARELTO   sodium bicarbonate 650 MG tablet     TAKE these medications   amLODipine 5 MG tablet Commonly known as:  NORVASC Take 1 tablet (5 mg total) by mouth at bedtime.   apixaban 2.5 MG Tabs tablet Commonly known as:  ELIQUIS Take 1 tablet (2.5 mg total) by mouth 2 (two) times daily.   atorvastatin 80 MG tablet Commonly known as:  LIPITOR Take 1 tablet (80 mg total) by mouth daily at 6 PM.   cephALEXin 250 MG capsule Commonly known as:  KEFLEX Take 1 capsule (250 mg total) by mouth every 12 (twelve) hours.   Diapers & Supplies Misc Please provide patient with adult diapers and pads   insulin aspart 100 UNIT/ML injection Commonly known as:  novoLOG Inject 10 Units into the skin as needed for high blood sugar.   linezolid 600 MG tablet Commonly known as:  ZYVOX Take 1 tablet (600 mg total) by mouth 2 (two) times daily.   polyethylene glycol powder powder Commonly known as:  MIRALAX Take 17 g by mouth daily. What changed:  when to take this  reasons to take this       Allergies  Allergen Reactions  . Penicillins Hives and Swelling    ++ tolerates ceftriaxone++  Has patient had a PCN reaction causing immediate rash, facial/tongue/throat swelling, SOB or lightheadedness with hypotension: yes Has patient had a PCN reaction causing severe rash involving mucus membranes or skin necrosis: no Has patient had a PCN reaction that required hospitalization no Has patient had a PCN reaction occurring within the last 10 years: no If all of the above answers are "NO", then may proceed with Cephalosporin use.     Consultations:  Urology  Infectious disease   Procedures/Studies: Ct  Abdomen Pelvis Wo Contrast  Result Date: 08/04/2016 CLINICAL DATA:  Diffuse abdominal pain.  Vomiting. EXAM: CT ABDOMEN AND PELVIS WITHOUT CONTRAST TECHNIQUE: Multidetector CT imaging of the abdomen and pelvis was performed following the standard protocol without IV contrast. Arms  down positioning due to contractures. COMPARISON:  CT 1198 FINDINGS: Lower chest: Dependent lower lobe atelectasis, right greater than left. Coronary artery calcifications versus stents. Hepatobiliary: No focal hepatic lesion allowing for lack contrast and streak artifact from arms down positioning. Clips in the gallbladder fossa postcholecystectomy. No biliary dilatation. Pancreas: No ductal dilatation or inflammation. Spleen: Normal in size without focal abnormality. Adrenals/Urinary Tract: Low-density nodule on the right adrenal gland likely adenoma. Left adrenal gland is normal. Obstructing 3 mm stone in the distal right ureter with moderate proximal hydroureteronephrosis. Moderate perinephric edema. Multiple additional nonobstructing stones in the right kidney. There is a 9 x 5 mm stone in the left ureteropelvic junction with mild proximal hydronephrosis. At least 2 additional nonobstructing stones in the left kidney. More distal ureters decompressed. Urinary bladder is decompressed by Foley catheter linear hyperdensity in the region the right urinary trigone appears similar to prior. Stomach/Bowel: Large volume of stool in the rectosigmoid colon with rectal distention of 8.4 cm. Rectal wall thickening and perirectal edema. Sigmoid colon is tortuous. Moderate stool in the more proximal colon. Sigmoid colonic diverticulosis without diverticulitis. Enteric sutures in the ascending colon/ cecum. No small bowel dilatation. Stomach is decompressed. Small hiatal hernia. Probable duodenal diverticulum. Vascular/Lymphatic: Aortic atherosclerosis and tortuosity. Decreased sensitivity for adenopathy given lack contrast, no bulky intra-abdominal or pelvic adenopathy is seen. Reproductive: Uterus not visualize, likely surgically absent. No adnexal mass. Other: No ascites or free air. Multiple pelvic phleboliths. Scatter clips/coils in the mesentery. Musculoskeletal: Scoliosis and degenerative change in the spine.  Heterotopic calcification about the right hip. Scattered subcutaneous calcifications about both flanks likely dystrophic. IMPRESSION: 1. Obstructing calculi in both renal collecting systems. On the left there is a 9 x 5 mm stone at the ureteropelvic junction with mild hydronephrosis. On the right there is an obstructing 3 x 4 mm stone at the ureterovesicular junction with moderate hydronephrosis and perinephric edema. 2. Large volume of stool in the rectosigmoid colon with rectal distention, rectal wall thickening and perirectal edema, suggesting fecal impaction. Colonic diverticulosis without diverticulitis. 3. Small hiatal hernia. 4. Aortic atherosclerosis. Electronically Signed   By: Jeb Levering M.D.   On: 08/04/2016 18:30   Ct Head Wo Contrast  Result Date: 08/04/2016 CLINICAL DATA:  Right-sided facial droop and altered consciousness. History stroke with right-sided deficits. High blood pressure. EXAM: CT HEAD WITHOUT CONTRAST TECHNIQUE: Contiguous axial images were obtained from the base of the skull through the vertex without intravenous contrast. COMPARISON:  11/23/2015 FINDINGS: Brain: Age advanced cerebral atrophy. Moderate and possibly progressive low density in the periventricular white matter likely related to small vessel disease. Left basal ganglia lacunar infarcts are felt to be similar. Left frontal lobe cortically based infarct is not significantly changed. Vascular: Intracranial atherosclerosis. Skull: No significant soft tissue swelling.  No skull fracture. Sinuses/Orbits: Normal imaged portions of the orbits and globes. Left maxillary sinus mucous retention cyst or polyp. Clear mastoid air cells. Other: None. IMPRESSION: 1. Age advanced cerebral atrophy with small vessel ischemic change, possibly progressive since February. 2. Remote left frontal infarct. 3. No convincing evidence of acute superimposed process. Electronically Signed   By: Abigail Miyamoto M.D.   On: 08/04/2016 15:01   Dg  [facility] 1 View  Result  Date: 08/04/2016 CLINICAL DATA:  Altered mental status with right-sided facial droop EXAM: PORTABLE CHEST 1 VIEW COMPARISON:  January 27, 2016 FINDINGS: There is no edema or consolidation. Heart is upper normal in size with pulmonary vascularity within normal limits. Aorta is tortuous. There is scoliosis in the thoracic region with multilevel degenerative type change. There is advanced arthropathy in each shoulder. IMPRESSION: No edema or consolidation. Stable cardiac silhouette. Advanced arthropathy in both shoulders. Electronically Signed   By: Lowella Grip III M.D.   On: 08/04/2016 15:22   Dg C-arm 1-60 Min-no Report  Result Date: 08/04/2016 Fluoroscopy was utilized by the requesting physician.  No radiographic interpretation.      Subjective: Patient feeling better, no nausea or vomiting, no chest pain or dyspnea.   Discharge Exam: Vitals:   08/07/16 2106 08/08/16 0543  BP: (!) 155/86 (!) 177/88  Pulse: 71 75  Resp: 18 18  Temp: 98 F (36.7 C) 98.5 F (36.9 C)   Vitals:   08/07/16 0400 08/07/16 1300 08/07/16 2106 08/08/16 0543  BP: (!) 153/76 (!) 150/73 (!) 155/86 (!) 177/88  Pulse: 71 65 71 75  Resp: 20 20 18 18   Temp: 98.9 F (37.2 C) 98.5 F (36.9 C) 98 F (36.7 C) 98.5 F (36.9 C)  TempSrc: Oral Oral Oral Oral  SpO2: 100% 99% 100% 99%  Weight:      Height:        General: Pt is alert, awake, not in acute distress E ENT: moist mucous membranes, mild pallor Cardiovascular: RRR, S1/S2 +, no rubs, no gallops Respiratory: CTA bilaterally, no wheezing, no rhonchi Abdominal: Soft, NT, ND, bowel sounds + Extremities: trace non pitting  edema, no cyanosis    The results of significant diagnostics from this hospitalization (including imaging, microbiology, ancillary and laboratory) are listed below for reference.     Microbiology: Recent Results (from the past 240 hour(s))  Urine Culture     Status: Abnormal   Collection Time:  08/04/16  7:52 PM  Result Value Ref Range Status   Specimen Description URINE, CATHETERIZED  Final   Special Requests NONE  Final   Culture >=100,000 COLONIES/mL ESCHERICHIA COLI (A)  Final   Report Status 08/07/2016 FINAL  Final   Organism ID, Bacteria ESCHERICHIA COLI (A)  Final      Susceptibility   Escherichia coli - MIC*    AMPICILLIN >=32 RESISTANT Resistant     CEFAZOLIN <=4 SENSITIVE Sensitive     CEFTRIAXONE <=1 SENSITIVE Sensitive     CIPROFLOXACIN >=4 RESISTANT Resistant     GENTAMICIN <=1 SENSITIVE Sensitive     IMIPENEM <=0.25 SENSITIVE Sensitive     NITROFURANTOIN <=16 SENSITIVE Sensitive     TRIMETH/SULFA >=320 RESISTANT Resistant     AMPICILLIN/SULBACTAM 16 INTERMEDIATE Intermediate     PIP/TAZO <=4 SENSITIVE Sensitive     Extended ESBL NEGATIVE Sensitive     * >=100,000 COLONIES/mL ESCHERICHIA COLI  Culture, blood (routine x 2)     Status: Abnormal (Preliminary result)   Collection Time: 08/04/16  9:53 PM  Result Value Ref Range Status   Specimen Description BLOOD LEFT ANTECUBITAL  Final   Special Requests   Final    BOTTLES DRAWN AEROBIC AND ANAEROBIC Blood Culture adequate volume   Culture  Setup Time   Final    GRAM POSITIVE COCCI IN CHAINS IN BOTH AEROBIC AND ANAEROBIC BOTTLES CRITICAL RESULT CALLED TO, READ BACK BY AND VERIFIED WITH: NICK [place], [place] 08/05/16 1512 L CHAMPION  Culture (A)  Final    ENTEROCOCCUS FAECALIS SUSCEPTIBILITIES TO FOLLOW Performed at [facility], [place] [address] [place], [place] [address]    Report Status PENDING  Incomplete  Blood Culture ID Panel (Reflexed)     Status: Abnormal   Collection Time: 08/04/16  9:53 PM  Result Value Ref Range Status   Enterococcus species DETECTED (A) NOT DETECTED Final    Comment: CRITICAL RESULT CALLED TO, READ BACK BY AND VERIFIED WITH: NICK GLOGOVAC, PHARMD 08/05/16 1512 L CHAMPION    Vancomycin resistance NOT DETECTED NOT DETECTED Final   Listeria monocytogenes NOT DETECTED NOT  DETECTED Final   Staphylococcus species NOT DETECTED NOT DETECTED Final   Staphylococcus aureus NOT DETECTED NOT DETECTED Final   Streptococcus species NOT DETECTED NOT DETECTED Final   Streptococcus agalactiae NOT DETECTED NOT DETECTED Final   Streptococcus pneumoniae NOT DETECTED NOT DETECTED Final   Streptococcus pyogenes NOT DETECTED NOT DETECTED Final   Acinetobacter baumannii NOT DETECTED NOT DETECTED Final   Enterobacteriaceae species NOT DETECTED NOT DETECTED Final   Enterobacter cloacae complex NOT DETECTED NOT DETECTED Final   Escherichia coli NOT DETECTED NOT DETECTED Final   Klebsiella oxytoca NOT DETECTED NOT DETECTED Final   Klebsiella pneumoniae NOT DETECTED NOT DETECTED Final   Proteus species NOT DETECTED NOT DETECTED Final   Serratia marcescens NOT DETECTED NOT DETECTED Final   Haemophilus influenzae NOT DETECTED NOT DETECTED Final   Neisseria meningitidis NOT DETECTED NOT DETECTED Final   Pseudomonas aeruginosa NOT DETECTED NOT DETECTED Final   Candida albicans NOT DETECTED NOT DETECTED Final   Candida glabrata NOT DETECTED NOT DETECTED Final   Candida krusei NOT DETECTED NOT DETECTED Final   Candida parapsilosis NOT DETECTED NOT DETECTED Final   Candida tropicalis NOT DETECTED NOT DETECTED Final  Culture, blood (routine x 2)     Status: Abnormal (Preliminary result)   Collection Time: 08/04/16 10:02 PM  Result Value Ref Range Status   Specimen Description BLOOD BLOOD LEFT FOREARM  Final   Special Requests   Final    BOTTLES DRAWN AEROBIC AND ANAEROBIC Blood Culture adequate volume   Culture  Setup Time   Final    GRAM POSITIVE COCCI IN CHAINS IN BOTH AEROBIC AND ANAEROBIC BOTTLES CRITICAL VALUE NOTED.  VALUE IS CONSISTENT WITH PREVIOUSLY REPORTED AND CALLED VALUE.    Culture ENTEROCOCCUS FAECALIS (A)  Final   Report Status PENDING  Incomplete  Urine Culture     Status: Abnormal (Preliminary result)   Collection Time: 08/04/16 11:11 PM  Result Value Ref Range  Status   Specimen Description CYSTOSCOPY  Final   Special Requests NONE  Final   Culture (A)  Final    >=100,000 COLONIES/mL ENTEROCOCCUS FAECALIS 2,000 COLONIES/mL ESCHERICHIA COLI SUSCEPTIBILITIES TO FOLLOW Performed at [facility], [address], [place], [place] [address]    Report Status PENDING  Incomplete  MRSA PCR Screening     Status: None   Collection Time: 08/05/16 12:09 AM  Result Value Ref Range Status   MRSA by PCR NEGATIVE NEGATIVE Final    Comment:        The GeneXpert MRSA Assay (FDA approved for NASAL specimens only), is one component of a comprehensive MRSA colonization surveillance program. It is not intended to diagnose MRSA infection nor to guide or monitor treatment for MRSA infections.   Culture, blood (routine x 2)     Status: None (Preliminary result)   Collection Time: 08/06/16  3:27 PM  Result  Value Ref Range Status   Specimen Description BLOOD BLOOD RIGHT HAND  Final   Special Requests   Final    BOTTLES DRAWN AEROBIC ONLY Blood Culture adequate volume   Culture   Final    NO GROWTH < 24 HOURS Performed at [facility], [address], [place], [place] [address]    Report Status PENDING  Incomplete  Culture, blood (routine x 2)     Status: None (Preliminary result)   Collection Time: 08/06/16  3:27 PM  Result Value Ref Range Status   Specimen Description BLOOD BLOOD LEFT HAND  Final   Special Requests IN PEDIATRIC BOTTLE Blood Culture adequate volume  Final   Culture   Final    NO GROWTH < 24 HOURS Performed at [facility], [place] [address] [place],  [address]    Report Status PENDING  Incomplete     Labs: BNP (last 3 results)  Recent Labs  08/05/16 0054  BNP 062.3*   Basic Metabolic Panel:  Recent Labs Lab 08/04/16 1404 08/04/16 1416 08/05/16 0319 08/06/16 0308 08/06/16 1108 08/07/16 0343 08/08/16 0423  NA 142 143 143 142 141  --  142  K 4.5 4.5 4.0 4.5 4.6  --  4.3  CL 114* 113* 116* 114*  113*  --  114*  CO2 18*  --  16* 19* 19*  --  20*  GLUCOSE 162* 159* 196* 109* 120*  --  96  BUN 40* 49* 41* 46* 45*  --  35*  CREATININE 2.24* 2.20* 2.22* 2.63* 2.57* 2.56* 2.21*  CALCIUM 9.1  --  7.6* 8.4* 8.5*  --  8.6*   Liver Function Tests:  Recent Labs Lab 08/04/16 1404  AST 16  ALT 8*  ALKPHOS 53  BILITOT 0.8  PROT 6.4*  ALBUMIN 3.5   No results for input(s): LIPASE, AMYLASE in the last 168 hours. No results for input(s): AMMONIA in the last 168 hours. CBC:  Recent Labs Lab 08/04/16 1404 08/04/16 1416 08/05/16 0319 08/06/16 0308 08/07/16 0343 08/08/16 0423  WBC 11.1*  --  13.6* 9.6 6.9 5.9  NEUTROABS 9.9*  --   --   --   --  3.2  HGB 10.2* 9.5* 8.8* 8.2* 7.5* 8.0*  HCT 30.8* 28.0* 26.0* 24.0* 21.7* 23.0*  MCV 92.2  --  91.5 92.0 91.6 89.8  PLT 219  --  180 185 193 194   Cardiac Enzymes:  Recent Labs Lab 08/05/16 0054 08/05/16 0818 08/05/16 1029  TROPONINI <0.03 <0.03 <0.03   BNP: Invalid input(s): POCBNP CBG:  Recent Labs Lab 08/07/16 0749 08/07/16 1138 08/07/16 1629 08/07/16 2100 08/08/16 0758  GLUCAP 98 144* 101* 121* 89   D-Dimer No results for input(s): DDIMER in the last 72 hours. Hgb A1c No results for input(s): HGBA1C in the last 72 hours. Lipid Profile No results for input(s): CHOL, HDL, LDLCALC, TRIG, CHOLHDL, LDLDIRECT in the last 72 hours. Thyroid function studies No results for input(s): TSH, T4TOTAL, T3FREE, THYROIDAB in the last 72 hours.  Invalid input(s): FREET3 Anemia work up No results for input(s): VITAMINB12, FOLATE, FERRITIN, TIBC, IRON, RETICCTPCT in the last 72 hours. Urinalysis    Component Value Date/Time   COLORURINE YELLOW 08/04/2016 1952   APPEARANCEUR CLOUDY (A) 08/04/2016 1952   LABSPEC 1.015 08/04/2016 1952   PHURINE 5.5 08/04/2016 1952   GLUCOSEU NEGATIVE 08/04/2016 1952   [place] (A) 08/04/2016 [place] NEGATIVE 08/04/2016 [place] NEGATIVE 08/04/2016 1952  PROTEINUR 30  (A) 08/04/2016 1952   UROBILINOGEN 0.2 06/20/2014 0808   NITRITE NEGATIVE 08/04/2016 1952   LEUKOCYTESUR LARGE (A) 08/04/2016 1952   Sepsis Labs Invalid input(s): PROCALCITONIN,  WBC,  LACTICIDVEN Microbiology Recent Results (from the past 240 hour(s))  Urine Culture     Status: Abnormal   Collection Time: 08/04/16  7:52 PM  Result Value Ref Range Status   Specimen Description URINE, CATHETERIZED  Final   Special Requests NONE  Final   Culture >=100,000 COLONIES/mL ESCHERICHIA COLI (A)  Final   Report Status 08/07/2016 FINAL  Final   Organism ID, Bacteria ESCHERICHIA COLI (A)  Final      Susceptibility   Escherichia coli - MIC*    AMPICILLIN >=32 RESISTANT Resistant     CEFAZOLIN <=4 SENSITIVE Sensitive     CEFTRIAXONE <=1 SENSITIVE Sensitive     CIPROFLOXACIN >=4 RESISTANT Resistant     GENTAMICIN <=1 SENSITIVE Sensitive     IMIPENEM <=0.25 SENSITIVE Sensitive     NITROFURANTOIN <=16 SENSITIVE Sensitive     TRIMETH/SULFA >=320 RESISTANT Resistant     AMPICILLIN/SULBACTAM 16 INTERMEDIATE Intermediate     PIP/TAZO <=4 SENSITIVE Sensitive     Extended ESBL NEGATIVE Sensitive     * >=100,000 COLONIES/mL ESCHERICHIA COLI  Culture, blood (routine x 2)     Status: Abnormal (Preliminary result)   Collection Time: 08/04/16  9:53 PM  Result Value Ref Range Status   Specimen Description BLOOD LEFT ANTECUBITAL  Final   Special Requests   Final    BOTTLES DRAWN AEROBIC AND ANAEROBIC Blood Culture adequate volume   Culture  Setup Time   Final    GRAM POSITIVE COCCI IN CHAINS IN BOTH AEROBIC AND ANAEROBIC BOTTLES CRITICAL RESULT CALLED TO, READ BACK BY AND VERIFIED WITH: [place], PHARMD 08/05/16 1512 L CHAMPION    Culture (A)  Final    ENTEROCOCCUS FAECALIS SUSCEPTIBILITIES TO FOLLOW Performed at [facility], [address], [place], [place] [address]    Report Status PENDING  Incomplete  Blood Culture ID Panel (Reflexed)     Status: Abnormal   Collection Time: 08/04/16   9:53 PM  Result Value Ref Range Status   Enterococcus species DETECTED (A) NOT DETECTED Final    Comment: CRITICAL RESULT CALLED TO, READ BACK BY AND VERIFIED WITH: NICK GLOGOVAC, PHARMD 08/05/16 1512 L CHAMPION    Vancomycin resistance NOT DETECTED NOT DETECTED Final   Listeria monocytogenes NOT DETECTED NOT DETECTED Final   Staphylococcus species NOT DETECTED NOT DETECTED Final   Staphylococcus aureus NOT DETECTED NOT DETECTED Final   Streptococcus species NOT DETECTED NOT DETECTED Final   Streptococcus agalactiae NOT DETECTED NOT DETECTED Final   Streptococcus pneumoniae NOT DETECTED NOT DETECTED Final   Streptococcus pyogenes NOT DETECTED NOT DETECTED Final   Acinetobacter baumannii NOT DETECTED NOT DETECTED Final   Enterobacteriaceae species NOT DETECTED NOT DETECTED Final   Enterobacter cloacae complex NOT DETECTED NOT DETECTED Final   Escherichia coli NOT DETECTED NOT DETECTED Final   Klebsiella oxytoca NOT DETECTED NOT DETECTED Final   Klebsiella pneumoniae NOT DETECTED NOT DETECTED Final   Proteus species NOT DETECTED NOT DETECTED Final   Serratia marcescens NOT DETECTED NOT DETECTED Final   Haemophilus influenzae NOT DETECTED NOT DETECTED Final   Neisseria meningitidis NOT DETECTED NOT DETECTED Final   Pseudomonas aeruginosa NOT DETECTED NOT DETECTED Final   Candida albicans NOT DETECTED NOT DETECTED Final   Candida glabrata NOT DETECTED NOT DETECTED Final  Candida krusei NOT DETECTED NOT DETECTED Final   Candida parapsilosis NOT DETECTED NOT DETECTED Final   Candida tropicalis NOT DETECTED NOT DETECTED Final  Culture, blood (routine x 2)     Status: Abnormal (Preliminary result)   Collection Time: 08/04/16 10:02 PM  Result Value Ref Range Status   Specimen Description BLOOD BLOOD LEFT FOREARM  Final   Special Requests   Final    BOTTLES DRAWN AEROBIC AND ANAEROBIC Blood Culture adequate volume   Culture  Setup Time   Final    GRAM POSITIVE COCCI IN CHAINS IN BOTH  AEROBIC AND ANAEROBIC BOTTLES CRITICAL VALUE NOTED.  VALUE IS CONSISTENT WITH PREVIOUSLY REPORTED AND CALLED VALUE.    Culture ENTEROCOCCUS FAECALIS (A)  Final   Report Status PENDING  Incomplete  Urine Culture     Status: Abnormal (Preliminary result)   Collection Time: 08/04/16 11:11 PM  Result Value Ref Range Status   Specimen Description CYSTOSCOPY  Final   Special Requests NONE  Final   Culture (A)  Final    >=100,000 COLONIES/mL ENTEROCOCCUS FAECALIS 2,000 COLONIES/mL ESCHERICHIA COLI SUSCEPTIBILITIES TO FOLLOW Performed at [facility], [address], [place], [place] [address]    Report Status PENDING  Incomplete  MRSA PCR Screening     Status: None   Collection Time: 08/05/16 12:09 AM  Result Value Ref Range Status   MRSA by PCR NEGATIVE NEGATIVE Final    Comment:        The GeneXpert MRSA Assay (FDA approved for NASAL specimens only), is one component of a comprehensive MRSA colonization surveillance program. It is not intended to diagnose MRSA infection nor to guide or monitor treatment for MRSA infections.   Culture, blood (routine x 2)     Status: None (Preliminary result)   Collection Time: 08/06/16  3:27 PM  Result Value Ref Range Status   Specimen Description BLOOD BLOOD RIGHT HAND  Final   Special Requests   Final    BOTTLES DRAWN AEROBIC ONLY Blood Culture adequate volume   Culture   Final    NO GROWTH < 24 HOURS Performed at [facility], [address], [place], [place] [address]    Report Status PENDING  Incomplete  Culture, blood (routine x 2)     Status: None (Preliminary result)   Collection Time: 08/06/16  3:27 PM  Result Value Ref Range Status   Specimen Description BLOOD BLOOD LEFT HAND  Final   Special Requests IN PEDIATRIC BOTTLE Blood Culture adequate volume  Final   Culture   Final    NO GROWTH < 24 HOURS Performed at [facility], [place] [address], [place], [place] [address]    Report Status PENDING  Incomplete      Time coordinating discharge: 45 minutes  SIGNED:   Tawni Millers, MD  [facility] 08/08/2016, 8:43 AM Pager   If 7PM-7AM, please contact night-coverage [email or web address] Password [email or web address]

## 2016-08-08 NOTE — Progress Notes (Signed)
Patient discharged to home, all discharge medications and instructions reviewed and questions answered.  Patient to be transported home via [place].

## 2016-08-10 ENCOUNTER — Other Ambulatory Visit: Payer: Self-pay | Admitting: Urology

## 2016-08-10 LAB — URINE CULTURE: Culture: >=100000 — AB

## 2016-08-11 LAB — CULTURE, BLOOD (ROUTINE X 2)
Culture: NO GROWTH
Culture: NO GROWTH
Special Requests: ADEQUATE
Special Requests: ADEQUATE

## 2016-08-13 DIAGNOSIS — I5040 Unspecified combined systolic (congestive) and diastolic (congestive) heart failure: Secondary | ICD-10-CM | POA: Diagnosis not present

## 2016-08-13 NOTE — Patient Instructions (Addendum)
Michelle Holmes  08/13/2016   Your procedure is scheduled on: 08/19/26  Report to [facility] Main  Entrance [facility] [place]  elevators to 3rd floor to  [place] at    0830 AM.    Call this number if you have problems the morning of surgery [phone]    Remember: ONLY 1 PERSON MAY GO WITH YOU TO SHORT STAY TO GET  READY MORNING OF YOUR SURGERY.  Do not eat food or drink liquids :After Midnight.     Take these medicines the morning of surgery with A SIP OF WATER: Zyvox, keflex DO NOT TAKE ANY   Oral  DIABETIC MEDICATIONS DAY OF YOUR SURGERY                               You may not have any metal on your body including hair pins and              piercings  Do not wear jewelry, make-up, lotions, powders or perfumes, deodorant             Do not wear nail polish.  Do not shave  48 hours prior to surgery.                Do not bring valuables to the hospital. [facility].  Contacts, dentures or bridgework may not be worn into surgery.      Patients discharged the day of surgery will not be allowed to drive home.  Name and phone number of your driver:  Special Instructions: N/A              Please read over the following fact sheets you were given: _____________________________________________________________________            [facility] - Preparing for Surgery Before surgery, you can play an important role.  Because skin is not sterile, your skin needs to be as free of germs as possible.  You can reduce the number of germs on your skin by washing with CHG (chlorahexidine gluconate) soap before surgery.  CHG is an antiseptic cleaner which kills germs and bonds with the skin to continue killing germs even after washing. Please DO NOT use if you have an allergy to CHG or antibacterial soaps.  If your skin becomes reddened/irritated stop using the CHG and inform your nurse when you arrive at Short Stay. Do not  shave (including legs and underarms) for at least 48 hours prior to the first CHG shower.  You may shave your face/neck. Please follow these instructions carefully:  1.  Shower with CHG Soap the night before surgery and the  morning of Surgery.  2.  If you choose to wash your hair, wash your hair first as usual with your  normal  shampoo.  3.  After you shampoo, rinse your hair and body thoroughly to remove the  shampoo.                           4.  Use CHG as you would any other liquid soap.  You can apply chg directly  to the skin and wash  Gently with a scrungie or clean washcloth.  5.  Apply the CHG Soap to your body ONLY FROM THE NECK DOWN.   Do not use on face/ open                           Wound or open sores. Avoid contact with eyes, ears mouth and genitals (private parts).                       Wash face,  Genitals (private parts) with your normal soap.             6.  Wash thoroughly, paying special attention to the area where your surgery  will be performed.  7.  Thoroughly rinse your body with warm water from the neck down.  8.  DO NOT shower/wash with your normal soap after using and rinsing off  the CHG Soap.                9.  Pat yourself dry with a clean towel.            10.  Wear clean pajamas.            11.  Place clean sheets on your bed the night of your first shower and do not  sleep with pets. Day of Surgery : Do not apply any lotions/deodorants the morning of surgery.  Please wear clean clothes to the hospital/surgery center.  FAILURE TO FOLLOW THESE INSTRUCTIONS MAY RESULT IN THE CANCELLATION OF YOUR SURGERY PATIENT SIGNATURE_________________________________  NURSE SIGNATURE__________________________________  ________________________________________________________________________ How to Manage Your Diabetes Before and After Surgery  Why is it important to control my blood sugar before and after surgery? . Improving blood sugar levels  before and after surgery helps healing and can limit problems. . A way of improving blood sugar control is eating a healthy diet by: o  Eating less sugar and carbohydrates o  Increasing activity/exercise o  Talking with your doctor about reaching your blood sugar goals . High blood sugars (greater than 180 mg/dL) can raise your risk of infections and slow your recovery, so you will need to focus on controlling your diabetes during the weeks before surgery. . Make sure that the doctor who takes care of your diabetes knows about your planned surgery including the date and location.  How do I manage my blood sugar before surgery? . Check your blood sugar at least 4 times a day, starting 2 days before surgery, to make sure that the level is not too high or low. o Check your blood sugar the morning of your surgery when you wake up and every 2 hours until you get to the Short Stay unit. . If your blood sugar is less than 70 mg/dL, you will need to treat for low blood sugar: o Do not take insulin. o Treat a low blood sugar (less than 70 mg/dL) with  cup of clear juice (cranberry or apple), 4 glucose tablets, OR glucose gel. o Recheck blood sugar in 15 minutes after treatment (to make sure it is greater than 70 mg/dL). If your blood sugar is not greater than 70 mg/dL on recheck, call [phone] for further instructions. . Report your blood sugar to the short stay nurse when you get to Short Stay.  . If you are admitted to the hospital after surgery: o Your blood sugar will be checked by the staff and you will probably be  given insulin after surgery (instead of oral diabetes medicines) to make sure you have good blood sugar levels. o The goal for blood sugar control after surgery is 80-180 mg/dL.   WHAT DO I DO ABOUT MY DIABETES MEDICATION?  . If your CBG is greater than 220 mg/dL, you may take  of your sliding scale    5 units . (correction) dose of insulin. .  .  Patient  Signature:  Date:   Nurse Signature:  Date:   Reviewed and Endorsed by [facility] Patient Education Committee, August 2015

## 2016-08-14 ENCOUNTER — Encounter (HOSPITAL_COMMUNITY): Payer: Self-pay

## 2016-08-14 ENCOUNTER — Encounter (HOSPITAL_COMMUNITY)
Admission: RE | Admit: 2016-08-14 | Discharge: 2016-08-14 | Disposition: A | Discharge status: Home / Self Care | Payer: Medicare Other | Source: Ambulatory Visit | Attending: Urology | Admitting: Urology

## 2016-08-14 DIAGNOSIS — Z01812 Encounter for preprocedural laboratory examination: Secondary | ICD-10-CM | POA: Insufficient documentation

## 2016-08-14 DIAGNOSIS — N201 Calculus of ureter: Secondary | ICD-10-CM | POA: Diagnosis not present

## 2016-08-14 LAB — BASIC METABOLIC PANEL
Anion gap: 10 (ref 5–15)
BUN: 26 mg/dL — ABNORMAL HIGH (ref 6–20)
CO2: 24 mmol/L (ref 22–32)
Calcium: 9.4 mg/dL (ref 8.9–10.3)
Chloride: 108 mmol/L (ref 101–111)
Creatinine, Ser: 2.17 mg/dL — ABNORMAL HIGH (ref 0.44–1.00)
GFR calc Af Amer: 26 mL/min — ABNORMAL LOW (ref >60)
GFR calc non Af Amer: 23 mL/min — ABNORMAL LOW (ref >60)
Glucose, Bld: 110 mg/dL — ABNORMAL HIGH (ref 65–99)
Potassium: 5.2 mmol/L — ABNORMAL HIGH (ref 3.5–5.1)
Sodium: 142 mmol/L (ref 135–145)

## 2016-08-14 LAB — CBC
HCT: 30.8 % — ABNORMAL LOW (ref 36.0–46.0)
Hemoglobin: 10 g/dL — ABNORMAL LOW (ref 12.0–15.0)
MCH: 29.7 pg (ref 26.0–34.0)
MCHC: 32.5 g/dL (ref 30.0–36.0)
MCV: 91.4 fL (ref 78.0–100.0)
Platelets: 307 10*3/uL (ref 150–400)
RBC: 3.37 MIL/uL — ABNORMAL LOW (ref 3.87–5.11)
RDW: 14.5 % (ref 11.5–15.5)
WBC: 8.1 10*3/uL (ref 4.0–10.5)

## 2016-08-14 LAB — GLUCOSE, CAPILLARY: Glucose-Capillary: 102 mg/dL — ABNORMAL HIGH (ref 65–99)

## 2016-08-14 NOTE — Progress Notes (Signed)
08/04/16 ekg epic 06/28/16 hgba1c 5.5 epic  cxr 08/04/16 epic

## 2016-08-14 NOTE — Progress Notes (Signed)
CBC ,BMP routed to Dr. Jeffie Pollock via epic 08/14/16

## 2016-08-15 DIAGNOSIS — G4733 Obstructive sleep apnea (adult) (pediatric): Secondary | ICD-10-CM | POA: Diagnosis not present

## 2016-08-17 MED ORDER — GENTAMICIN SULFATE 40 MG/ML IJ SOLN
5.0000 mg/kg | INTRAVENOUS | Status: AC
  Administered 2016-08-18: 330 mg via INTRAVENOUS
  Filled 2016-08-17: qty 8.25

## 2016-08-17 NOTE — Progress Notes (Signed)
LM on cell phone to call back for time change for surgery on 08/18/2016! LM to call back by 4 pm.

## 2016-08-18 ENCOUNTER — Ambulatory Visit (HOSPITAL_COMMUNITY)
Admission: RE | Admit: 2016-08-18 | Discharge: 2016-08-18 | Disposition: A | Discharge status: Home / Self Care | Payer: Medicare Other | Source: Ambulatory Visit | Attending: Urology | Admitting: Urology

## 2016-08-18 ENCOUNTER — Encounter (HOSPITAL_COMMUNITY)
Admission: RE | Disposition: A | Discharge status: Home / Self Care | Payer: Self-pay | Source: Ambulatory Visit | Attending: Urology

## 2016-08-18 ENCOUNTER — Ambulatory Visit (HOSPITAL_COMMUNITY): Payer: Medicare Other

## 2016-08-18 ENCOUNTER — Ambulatory Visit (HOSPITAL_COMMUNITY): Payer: Medicare Other | Admitting: Anesthesiology

## 2016-08-18 ENCOUNTER — Encounter (HOSPITAL_COMMUNITY): Payer: Self-pay | Admitting: *Deleted

## 2016-08-18 DIAGNOSIS — Z993 Dependence on wheelchair: Secondary | ICD-10-CM | POA: Diagnosis not present

## 2016-08-18 DIAGNOSIS — I10 Essential (primary) hypertension: Secondary | ICD-10-CM | POA: Insufficient documentation

## 2016-08-18 DIAGNOSIS — M549 Dorsalgia, unspecified: Secondary | ICD-10-CM | POA: Diagnosis not present

## 2016-08-18 DIAGNOSIS — E785 Hyperlipidemia, unspecified: Secondary | ICD-10-CM | POA: Diagnosis not present

## 2016-08-18 DIAGNOSIS — N184 Chronic kidney disease, stage 4 (severe): Secondary | ICD-10-CM | POA: Diagnosis not present

## 2016-08-18 DIAGNOSIS — M1990 Unspecified osteoarthritis, unspecified site: Secondary | ICD-10-CM | POA: Insufficient documentation

## 2016-08-18 DIAGNOSIS — G4730 Sleep apnea, unspecified: Secondary | ICD-10-CM | POA: Insufficient documentation

## 2016-08-18 DIAGNOSIS — Z7901 Long term (current) use of anticoagulants: Secondary | ICD-10-CM | POA: Diagnosis not present

## 2016-08-18 DIAGNOSIS — I2510 Atherosclerotic heart disease of native coronary artery without angina pectoris: Secondary | ICD-10-CM | POA: Diagnosis not present

## 2016-08-18 DIAGNOSIS — I5032 Chronic diastolic (congestive) heart failure: Secondary | ICD-10-CM | POA: Insufficient documentation

## 2016-08-18 DIAGNOSIS — G8929 Other chronic pain: Secondary | ICD-10-CM | POA: Insufficient documentation

## 2016-08-18 DIAGNOSIS — Z8673 Personal history of transient ischemic attack (TIA), and cerebral infarction without residual deficits: Secondary | ICD-10-CM | POA: Diagnosis not present

## 2016-08-18 DIAGNOSIS — N202 Calculus of kidney with calculus of ureter: Secondary | ICD-10-CM | POA: Diagnosis not present

## 2016-08-18 DIAGNOSIS — Z6827 Body mass index (BMI) 27.0-27.9, adult: Secondary | ICD-10-CM | POA: Insufficient documentation

## 2016-08-18 DIAGNOSIS — E7800 Pure hypercholesterolemia, unspecified: Secondary | ICD-10-CM | POA: Insufficient documentation

## 2016-08-18 DIAGNOSIS — N201 Calculus of ureter: Secondary | ICD-10-CM | POA: Diagnosis not present

## 2016-08-18 DIAGNOSIS — M419 Scoliosis, unspecified: Secondary | ICD-10-CM | POA: Insufficient documentation

## 2016-08-18 DIAGNOSIS — I252 Old myocardial infarction: Secondary | ICD-10-CM | POA: Diagnosis not present

## 2016-08-18 DIAGNOSIS — J449 Chronic obstructive pulmonary disease, unspecified: Secondary | ICD-10-CM | POA: Diagnosis not present

## 2016-08-18 DIAGNOSIS — N200 Calculus of kidney: Secondary | ICD-10-CM | POA: Diagnosis not present

## 2016-08-18 DIAGNOSIS — I130 Hypertensive heart and chronic kidney disease with heart failure and stage 1 through stage 4 chronic kidney disease, or unspecified chronic kidney disease: Secondary | ICD-10-CM | POA: Insufficient documentation

## 2016-08-18 DIAGNOSIS — Z86718 Personal history of other venous thrombosis and embolism: Secondary | ICD-10-CM | POA: Insufficient documentation

## 2016-08-18 DIAGNOSIS — Z9981 Dependence on supplemental oxygen: Secondary | ICD-10-CM | POA: Diagnosis not present

## 2016-08-18 DIAGNOSIS — G4733 Obstructive sleep apnea (adult) (pediatric): Secondary | ICD-10-CM | POA: Insufficient documentation

## 2016-08-18 DIAGNOSIS — E1122 Type 2 diabetes mellitus with diabetic chronic kidney disease: Secondary | ICD-10-CM | POA: Diagnosis not present

## 2016-08-18 DIAGNOSIS — Z794 Long term (current) use of insulin: Secondary | ICD-10-CM | POA: Diagnosis not present

## 2016-08-18 DIAGNOSIS — Z466 Encounter for fitting and adjustment of urinary device: Secondary | ICD-10-CM | POA: Diagnosis not present

## 2016-08-18 DIAGNOSIS — Z955 Presence of coronary angioplasty implant and graft: Secondary | ICD-10-CM | POA: Insufficient documentation

## 2016-08-18 HISTORY — PX: CYSTOSCOPY/URETEROSCOPY/HOLMIUM LASER/STENT PLACEMENT: SHX6546

## 2016-08-18 LAB — GLUCOSE, CAPILLARY: Glucose-Capillary: 110 mg/dL — ABNORMAL HIGH (ref 65–99)

## 2016-08-18 SURGERY — CYSTOSCOPY/URETEROSCOPY/HOLMIUM LASER/STENT PLACEMENT
Anesthesia: General | Laterality: Bilateral

## 2016-08-18 MED ORDER — FENTANYL CITRATE (PF) 100 MCG/2ML IJ SOLN
INTRAMUSCULAR | Status: AC
  Filled 2016-08-18: qty 2

## 2016-08-18 MED ORDER — LACTATED RINGERS IV SOLN
INTRAVENOUS | Status: DC
  Administered 2016-08-18 (×2): via INTRAVENOUS

## 2016-08-18 MED ORDER — PROPOFOL 10 MG/ML IV BOLUS
INTRAVENOUS | Status: DC | PRN
  Administered 2016-08-18: 150 mg via INTRAVENOUS

## 2016-08-18 MED ORDER — OXYCODONE HCL 5 MG PO TABS: 5.0000 mg | ORAL_TABLET | Freq: Once | ORAL | Status: DC | PRN

## 2016-08-18 MED ORDER — OXYCODONE HCL 5 MG/5ML PO SOLN: 5.0000 mg | Freq: Once | ORAL | Status: DC | PRN

## 2016-08-18 MED ORDER — PROPOFOL 10 MG/ML IV BOLUS
INTRAVENOUS | Status: AC
  Filled 2016-08-18: qty 60

## 2016-08-18 MED ORDER — PROPOFOL 10 MG/ML IV BOLUS
INTRAVENOUS | Status: AC
  Filled 2016-08-18: qty 20

## 2016-08-18 MED ORDER — LINEZOLID 600 MG PO TABS: 600.0000 mg | ORAL_TABLET | Freq: Two times a day (BID) | ORAL | 0 refills | Status: AC

## 2016-08-18 MED ORDER — SODIUM CHLORIDE 0.9 % IR SOLN
Status: DC | PRN
  Administered 2016-08-18: 2000 mL via INTRAVESICAL
  Administered 2016-08-18: 1000 mL via INTRAVESICAL

## 2016-08-18 MED ORDER — PHENYLEPHRINE HCL 10 MG/ML IJ SOLN
INTRAMUSCULAR | Status: DC | PRN
  Administered 2016-08-18 (×3): 80 ug via INTRAVENOUS

## 2016-08-18 MED ORDER — OXYCODONE-ACETAMINOPHEN 5-325 MG PO TABS: 1.0000 | ORAL_TABLET | Freq: Four times a day (QID) | ORAL | 0 refills | Status: DC | PRN

## 2016-08-18 MED ORDER — MIDAZOLAM HCL 2 MG/2ML IJ SOLN
INTRAMUSCULAR | Status: AC
  Filled 2016-08-18: qty 2

## 2016-08-18 MED ORDER — PHENYLEPHRINE 40 MCG/ML (10ML) SYRINGE FOR IV PUSH (FOR BLOOD PRESSURE SUPPORT)
PREFILLED_SYRINGE | INTRAVENOUS | Status: AC
  Filled 2016-08-18: qty 10

## 2016-08-18 MED ORDER — CEPHALEXIN 250 MG PO CAPS
250.0000 mg | ORAL_CAPSULE | Freq: Two times a day (BID) | ORAL | 0 refills | Status: AC
Start: 2016-08-18 — End: 2016-08-22

## 2016-08-18 MED ORDER — DEXTROSE 5 % IV SOLN
2.0000 g | INTRAVENOUS | Status: AC
  Administered 2016-08-18: 2 g via INTRAVENOUS
  Filled 2016-08-18: qty 2

## 2016-08-18 MED ORDER — DEXAMETHASONE SODIUM PHOSPHATE 4 MG/ML IJ SOLN
INTRAMUSCULAR | Status: DC | PRN
  Administered 2016-08-18: 4 mg via INTRAVENOUS

## 2016-08-18 MED ORDER — PROMETHAZINE HCL 25 MG/ML IJ SOLN: 6.2500 mg | INTRAMUSCULAR | Status: DC | PRN

## 2016-08-18 MED ORDER — FENTANYL CITRATE (PF) 250 MCG/5ML IJ SOLN
INTRAMUSCULAR | Status: DC | PRN
  Administered 2016-08-18 (×4): 50 ug via INTRAVENOUS

## 2016-08-18 MED ORDER — HYDROMORPHONE HCL-NACL 0.5-0.9 MG/ML-% IV SOSY: 0.2500 mg | PREFILLED_SYRINGE | INTRAVENOUS | Status: DC | PRN

## 2016-08-18 MED ORDER — PROPOFOL 500 MG/50ML IV EMUL
INTRAVENOUS | Status: DC | PRN
  Administered 2016-08-18: 75 ug/kg/min via INTRAVENOUS

## 2016-08-18 MED ORDER — 0.9 % SODIUM CHLORIDE (POUR BTL) OPTIME
TOPICAL | Status: DC | PRN
  Administered 2016-08-18: 1000 mL

## 2016-08-18 MED ORDER — APIXABAN 2.5 MG PO TABS: 2.5000 mg | ORAL_TABLET | Freq: Two times a day (BID) | ORAL | 0 refills | Status: DC

## 2016-08-18 MED ORDER — ONDANSETRON HCL 4 MG/2ML IJ SOLN
INTRAMUSCULAR | Status: DC | PRN
Start: 2016-08-18 — End: 2016-08-18
  Administered 2016-08-18: 4 mg via INTRAVENOUS

## 2016-08-18 MED ORDER — LIDOCAINE HCL (CARDIAC) 20 MG/ML IV SOLN
INTRAVENOUS | Status: DC | PRN
  Administered 2016-08-18: 50 mg via INTRAVENOUS

## 2016-08-18 SURGICAL SUPPLY — 22 items
BAG URINE LEG 500ML (DRAIN) ×1 IMPLANT
BAG URO CATCHER STRL LF (MISCELLANEOUS) ×2 IMPLANT
BASKET STONE NCOMPASS (UROLOGICAL SUPPLIES) IMPLANT
CATH FOLEY 2WAY SLVR  5CC 16FR (CATHETERS) ×1
CATH FOLEY 2WAY SLVR 5CC 16FR (CATHETERS) IMPLANT
CATH URET 5FR 28IN OPEN ENDED (CATHETERS) IMPLANT
CATH URET DUAL LUMEN 6-10FR 50 (CATHETERS) ×2 IMPLANT
CLOTH BEACON ORANGE TIMEOUT ST (SAFETY) ×2 IMPLANT
COVER SURGICAL LIGHT HANDLE (MISCELLANEOUS) ×2 IMPLANT
EXTRACTOR STONE NITINOL NGAGE (UROLOGICAL SUPPLIES) ×2 IMPLANT
FIBER LASER FLEXIVA 1000 (UROLOGICAL SUPPLIES) IMPLANT
FIBER LASER FLEXIVA 365 (UROLOGICAL SUPPLIES) IMPLANT
FIBER LASER FLEXIVA 550 (UROLOGICAL SUPPLIES) IMPLANT
FIBER LASER TRAC TIP (UROLOGICAL SUPPLIES) ×1 IMPLANT
GLOVE SURG SS PI 8.0 STRL IVOR (GLOVE) IMPLANT
GOWN STRL REUS W/TWL XL LVL3 (GOWN DISPOSABLE) ×2 IMPLANT
GUIDEWIRE STR DUAL SENSOR (WIRE) ×3 IMPLANT
MANIFOLD NEPTUNE II (INSTRUMENTS) ×2 IMPLANT
PACK CYSTO (CUSTOM PROCEDURE TRAY) ×2 IMPLANT
SHEATH ACCESS URETERAL 38CM (SHEATH) ×2 IMPLANT
STENT URET 6FRX24 CONTOUR (STENTS) ×1 IMPLANT
TUBING CONNECTING 10 (TUBING) ×2 IMPLANT

## 2016-08-18 NOTE — Interval H&P Note (Signed)
History and Physical Interval Note:  Michelle Holmes returns for bilateral ureteroscopy for her stones.  She previously had stents placed.    08/18/2016 12:27 PM  Michelle Holmes  has presented today for surgery, with the diagnosis of BILATERAL URETERAL STONES  The various methods of treatment have been discussed with the patient and family. After consideration of risks, benefits and other options for treatment, the patient has consented to  Procedure(s): BILATERAL URETEROSCOPY WITH HOLMIUM LASER AND STENTS (Bilateral) as a surgical intervention .  The patient's history has been reviewed, patient examined, no change in status, stable for surgery.  I have reviewed the patient's chart and labs.  Questions were answered to the patient's satisfaction.     Michelle Holmes

## 2016-08-18 NOTE — Transfer of Care (Signed)
Immediate Anesthesia Transfer of Care Note  Patient: Michelle Holmes  Procedure(s) Performed: Procedure(s): BILATERAL URETEROSCOPY WITH HOLMIUM LASER AND STENTS (Bilateral)  Patient Location: PACU  Anesthesia Type:General  Level of Consciousness: awake, alert  and oriented  Airway & Oxygen Therapy: Patient Spontanous Breathing and Patient connected to nasal cannula oxygen  Post-op Assessment: Report given to RN and Post -op Vital signs reviewed and stable  Post vital signs: Reviewed and stable  Last Vitals:  Vitals:   08/18/16 0830  BP: (!) 145/89  Pulse: 80  Resp: 18  Temp: 36.6 C    Last Pain:  Vitals:   08/18/16 0830  TempSrc: Oral         Complications: No apparent anesthesia complications

## 2016-08-18 NOTE — Anesthesia Procedure Notes (Addendum)
Procedure Name: LMA Insertion Date/Time: 08/18/2016 12:39 PM Performed by: Chandra Batch A Pre-anesthesia Checklist: Patient identified, Emergency Drugs available, Suction available, Patient being monitored and Timeout performed Patient Re-evaluated:Patient Re-evaluated prior to induction Oxygen Delivery Method: Circle system utilized Preoxygenation: Pre-oxygenation with 100% oxygen Induction Type: IV induction Ventilation: Mask ventilation without difficulty LMA: LMA inserted LMA Size: 4.0 Number of attempts: 1 Placement Confirmation: positive ETCO2 Dental Injury: Teeth and Oropharynx as per pre-operative assessment

## 2016-08-18 NOTE — Discharge Instructions (Signed)
General Anesthesia, Adult, Care After These instructions provide you with information about caring for yourself after your procedure. Your health care provider may also give you more specific instructions. Your treatment has been planned according to current medical practices, but problems sometimes occur. Call your health care provider if you have any problems or questions after your procedure. What can I expect after the procedure? After the procedure, it is common to have:  Vomiting.  A sore throat.  Mental slowness.  It is common to feel:  Nauseous.  Cold or shivery.  Sleepy.  Tired.  Sore or achy, even in parts of your body where you did not have surgery.  Follow these instructions at home: For at least 24 hours after the procedure:  Do not: ? Participate in activities where you could fall or become injured. ? Drive. ? Use heavy machinery. ? Drink alcohol. ? Take sleeping pills or medicines that cause drowsiness. ? Make important decisions or sign legal documents. ? Take care of children on your own.  Rest. Eating and drinking  If you vomit, drink water, juice, or soup when you can drink without vomiting.  Drink enough fluid to keep your urine clear or pale yellow.  Make sure you have little or no nausea before eating solid foods.  Follow the diet recommended by your health care provider. General instructions  Have a responsible adult stay with you until you are awake and alert.  Return to your normal activities as told by your health care provider. Ask your health care provider what activities are safe for you.  Take over-the-counter and prescription medicines only as told by your health care provider.  If you smoke, do not smoke without supervision.  Keep all follow-up visits as told by your health care provider. This is important. Contact a health care provider if:  You continue to have nausea or vomiting at home, and medicines are not helpful.  You  cannot drink fluids or start eating again.  You cannot urinate after 8-12 hours.  You develop a skin rash.  You have fever.  You have increasing redness at the site of your procedure. Get help right away if:  You have difficulty breathing.  You have chest pain.  You have unexpected bleeding.  You feel that you are having a life-threatening or urgent problem. This information is not intended to replace advice given to you by your health care provider. Make sure you discuss any questions you have with your health care provider. Document Released: 04/20/2000 Document Revised: 06/17/2015 Document Reviewed: 12/27/2014 Elsevier Interactive Patient Education  2018 [employer].    Indwelling Urinary Catheter Insertion, Care After This sheet gives you information about how to care for yourself after your procedure. Your health care provider may also give you more specific instructions. If you have problems or questions, contact your health care provider. What can I expect after the procedure? After the procedure, it is common to have:  Slight discomfort around your urethra where the catheter enters your body.  Follow these instructions at home:  Keep the drainage bag at or below the level of your bladder. Doing this ensures that urine can only drain out, not back into your body.  Secure the catheter tubing and drainage bag to your leg or thigh to keep it from moving.  Check the catheter tubing regularly to make sure there are no kinks or blockages.  Take showers daily to keep the catheter clean. Do not take a bath.  Do  not pull on your catheter or try to remove it.  Disconnect the tubing and drainage bag as little as possible.  Empty the drainage bag every 2-4 hours, or more often if needed. Do not let the bag get completely full.  Wash your hands with soap and water before and after touching the catheter, tubing, or drainage bag.  Do not let the drainage bag or catheter  tubing touch the floor.  Drink enough fluids to keep your urine clear or pale yellow, or as told by your health care provider. Contact a health care provider if:  Urine stops flowing into the drainage bag.  You feel pain or pressure in the bladder area.  You have back pain.  Your catheter gets clogged.  Your catheter starts to leak.  Your urine looks cloudy.  Your drainage bag or tubing looks dirty.  You notice a bad smell when emptying your drainage bag. Get help right away if:  You have a fever or chills.  You have severe pain in your back or your lower abdomen.  You have warmth, redness, swelling, or pain in the urethra area.  You notice blood in your urine.  Your catheter gets pulled out. Summary  Do not pull on your catheter or try to remove it.  Keep the drainage bag at or below the level of your bladder, but do not let the drainage bag or catheter tubing touch the floor.  Wash your hands with soap and water before and after touching the catheter, tubing, or drainage bag.  Contact your health care provider if you have a fever, chills, or any other signs of infection. This information is not intended to replace advice given to you by your health care provider. Make sure you discuss any questions you have with your health care provider. Document Released: 02/22/2016 Document Revised: 02/22/2016 Document Reviewed: 02/22/2016 Elsevier Interactive Patient Education  [employer].

## 2016-08-18 NOTE — Op Note (Signed)
Operative Note  Date: 08/18/2016  Preoperative Diagnosis: Bilateral nephrolithiasis    Postoperative Diagnosis: Same   Procedure(s) Performed: 1. Cystourethroscopy 2. Bilateral ureteral stent removal 3. Bilateral flexible ureteroscopy with laser lithotripsy and basket extraction 4. Bilateral 6 French x 24 cm ureteral stent placement with dangler 5. Intraoperative fluoroscopy with interpretation, <1 hour  Surgeons: Primary: Irine Seal, MD  Resident Assistant: Jonna Clark MD   Anesthesia: General Endotracheal Anesthesiologist: Murvin Natal, MD CRNA: Campbell Lerner, CRNA   Indications:  Michelle Holmes  is a 66 y.o.-year-old female with a history of nephrolithiasis. She was initially found to have bilateral obstructing ureteral stones on 08/04/16. Right ureteral stone was noted to be distal in the ureter, with significant ~1cm total stone burden in renal pelvis, and left ureteral stone was noted to be at the UPJ. She underwent bilateral ureteral stent placement on 08/04/16. Urine culture was positive for E coli and enterococcus, and treated with a course of antibiotics.   The patient presents today for ureteroscopic treatment of her bilateral ureteral stones.  The risks and benefits of the procedure were discussed with the patient who wishes to proceed.  Findings: Normal cystourethroscopy with bilateral JJ stent curls in place without encrustation.  Right kidney demonstrated no distal ureteral stone, but multiple small non obstructing stones noted in the renal pelvis, all basked extracted. Tortuous proximal ureter. Left kidney demonstrated 2 large stones, and multiple small stones in renal pelvis, laser lithotripsied and basket extracted. Successful placement of bilateral ureteral stent.     Procedure Details:       The patient was correctly identified in the preoperative holding area where written informed consent as well potential risks and complications were reviewed. The  patient was brought back to the operative suite where a preinduction timeout was performed. After correct information was verified, general anesthesia was induced via LMA.  The patient was then gently placed in dorsal lithotomy position.  Sequential compression devices were placed for VTE prophylaxis. The patient was prepped and draped in the usual sterile fashion and appropriate periprocedural antibiotics were administered. A second timeout was performed.  The patient's chronic foley catheter was removed.   Cystoscopy We began by inserting a 22 French rigid cystoscope per urethra with copious lubrication and normal saline irrigation running. The bladder and urethra appeared grossly normal. We turned our attention to the right ureteral orifice, with J stent curl emanating without encrustation. We then grasped the stent tip with a flexible grasper and brought it to the urethral meatus.  This was canulated with a sensor wire without difficulty and stent was removed under fluoroscopic guidance. The sensor wire was advanced to the right renal pelvis under fluoroscopic guidance.   Right ureteroscopic stone extraction We began with semi rigid ureteroscope passed alongside our wire into the right ureter. Distal and mid ureteroscopy demonstrated no distal ureteral stone, likely indicating passage. We removed out our ureteroscope and advanced a  12/14 French ureteral access sheath under fluoroscopic guidance without difficulty to the proximal ureter just distal to the UPJ.  We then passed a flexible ureteroscope into the renal collecting system and performed pan pyeloscopy as described above.  Multiple renal stones were noted as described in the above findings and the patient was noted with few Randall plaques.  Using basket extraction, all visualized stones were removed and the patient was felt to be stone-free at the end of the procedure.  We performed pull out ureteroscopy, which did not demonstrate any additional  stones. We emptied the patient's bladder, leading our sensor wire in place.   Left ureteroscopic stone extraction We turned our attention to the left ureteral orifice, with J stent curl emanating without encrustation. We then grasped the stent tip with a flexible grasper and brought it to the urethral meatus.  This was canulated with a sensor wire without difficulty and stent was removed under fluoroscopic guidance. The sensor wire was advanced to the left renal pelvis under fluoroscopic guidance. Next we advanced a 12/14 French ureteral access sheath under fluoroscopic guidance without difficulty to the proximal ureter just distal to the UPJ.  We then passed a flexible ureteroscope into the renal collecting system and performed pan pyeloscopy as described above.  Multiple renal stones were noted as described in the above findings and the patient was noted with few Randall plaques.  Using a combination of laser lithotripsy and basket extraction, all visualized stones were removed and the patient was felt to be stone-free at the end of the procedure.  We performed pull out ureteroscopy, which did not demonstrate any additional stones. We emptied the patient's bladder, leading our sensor wire in place.   Ureteral stent placement   At this point, we proceeded with ureteral stent placement.  On the left, we placed a 6 x 24 double-J ureteral stent with dangler with the assistance of a pusher over our sensor wire with a satisfactory stent curl proximally and distally within the bladder under fluoroscopic guidance. We then did the same on the right, placing a 6 x 24 JJ ureteral stent with dangler under fluoroscopic guidance, confirming appropriate placement . We then refilled and emptied the patient's bladder to ensure all debris was removed and left the bladder empty and removed all instrumentation, leaving the bladder full. A 16Fr straight foley catheter was inserted per urethra and 10cc sterile water was used to  inflate the balloon. The bladder was emptied. The stent strings were trimmed to a short dangle and the patient was awoken from anesthesia and taken to the recovery area.  Estimated Blood Loss:  Minimal            Total IV Fluids: See anesthesia record         Specimens: Stone for analysis      Complications:  None         Disposition: PACU - hemodynamically stable.         Condition: stable  Post-Op Plan/Instructions: 1) Discharge home when meets PACU criteria  2) Continue foley catheter to drainage  3) Patient will remove ureteral stents at home on Friday, 7/27. She will continue on culture specific antibiotics through Saturday (linezolid and cefazolin).

## 2016-08-18 NOTE — Anesthesia Preprocedure Evaluation (Addendum)
Anesthesia Evaluation  Patient identified by MRN, date of birth, ID band Patient confused    Reviewed: Allergy & Precautions, NPO status , Patient's Chart, lab work & pertinent test results  Airway Mallampati: II  TM Distance: >3 FB Neck ROM: Full    Dental  (+) Dental Advisory Given, Poor Dentition, Missing   Pulmonary sleep apnea , COPD (2 liters at night),  oxygen dependent,    breath sounds clear to auscultation       Cardiovascular hypertension, Pt. on medications + CAD, + Past MI, + Peripheral Vascular Disease and +CHF   Rhythm:Regular Rate:Normal  ECG: SR, rate 75  ECHO: - Left ventricle: The cavity size was normal. There was moderate concentric hypertrophy. Systolic function was normal. The estimated ejection fraction was in the range of 55% to 60%. Wall motion was normal; there were no regional wall motion abnormalities. Doppler parameters are consistent with abnormal left ventricular relaxation (grade 1 diastolic dysfunction). Mitral valve: There was mild regurgitation. Left atrium: The atrium was moderately dilated. Impressions: Ask to read by hospitalist Originally in Dr Harwani&'s basket and not read. No subcostal images.   Neuro/Psych TIACVA (right sided weakness), Residual Symptoms    GI/Hepatic negative GI ROS, Neg liver ROS,   Endo/Other  diabetes, Insulin Dependent  Renal/GU Renal InsufficiencyRenal diseaseBilateral obstructing ureteral stones.     Musculoskeletal  (+) Arthritis ,   Abdominal   Peds  Hematology  (+) anemia ,   Anesthesia Other Findings Hypercholesteremia Wheelchair bound  Reproductive/Obstetrics                          Lab Results  Component Value Date   WBC 8.1 08/14/2016   HGB 10.0 (L) 08/14/2016   HCT 30.8 (L) 08/14/2016   MCV 91.4 08/14/2016   PLT 307 08/14/2016   Lab Results  Component Value Date   CREATININE 2.17 (H) 08/14/2016   BUN 26 (H)  08/14/2016   NA 142 08/14/2016   K 5.2 (H) 08/14/2016   CL 108 08/14/2016   CO2 24 08/14/2016    Anesthesia Physical  Anesthesia Plan  ASA: IV  Anesthesia Plan: General   Post-op Pain Management:    Induction: Intravenous  PONV Risk Score and Plan: 4 or greater and Ondansetron, Dexamethasone, Propofol and Treatment may vary due to age or medical condition  Airway Management Planned: LMA  Additional Equipment:   Intra-op Plan:   Post-operative Plan: Extubation in OR  Informed Consent: I have reviewed the patients History and Physical, chart, labs and discussed the procedure including the risks, benefits and alternatives for the proposed anesthesia with the patient or authorized representative who has indicated his/her understanding and acceptance.   Dental advisory given  Plan Discussed with: CRNA  Anesthesia Plan Comments: Lyla Son)      Anesthesia Quick Evaluation

## 2016-08-18 NOTE — H&P (View-Only) (Signed)
I have been asked to see the patient by Dr. Lacretia Leigh, for evaluation and management of bilateral ureteral stones with altered mental status and nausea/vomiting.Michelle Holmes  History of present illness: 66 year old female known to urology for an urgent bladder and recurrent urinary tract infections who presented to the emergency department brought by her family for altered mental status, nausea, and vomiting. A thorough evaluation of the patient demonstrated that she had bilateral obstructing ureteral stones. Her white count was normal, she had no fever, but she does have altered mental status and history of recurrent urinary tract infections.  Given the above, one consult it, I recommended she be transferred to the [facility] for further management including bilateral ureteral stent placement. Upon arrival, the patient was noncommunicative and somnolent. The patient states that she is well off her baseline. She was treated for hypertension in the emergency department and cultures were drawn.  Review of systems: A 12 point comprehensive review of systems was obtained and is negative unless otherwise stated in the history of present illness.  Patient Active Problem List   Diagnosis Date Noted  . Stroke (cerebrum) ([place]) 08/04/2016  . CKD (chronic kidney disease), stage IV ([place]) 08/04/2016  . Hypertensive urgency 08/04/2016  . Kidney stones 08/04/2016  . Long-term current use of opiate analgesic 03/05/2016  . Essential hypertension 03/05/2016  . Dvt femoral (deep venous thrombosis) ([place]) 02/17/2016  . Chronic diastolic CHF (congestive heart failure) ([place]) 01/28/2016  . Dehydration 01/28/2016  . Protein-calorie malnutrition, severe 01/28/2016  . Emesis   . Goals of care, counseling/discussion   . Palliative care by specialist   . Anemia in other chronic diseases classified elsewhere   . Acute urinary retention   . Pressure injury of skin 11/24/2015  . Chronic kidney disease (CKD), stage IV  (severe) ([place]) 07/06/2015  . Acute CVA (cerebrovascular accident) ([place])   . Sinus bradycardia 07/04/2015  . Complicated UTI (urinary tract infection) 07/04/2015  . Parietal lobe infarction ([place]) 07/04/2015  . Spastic hemiplegia affecting nondominant side ([place]) 07/01/2015  . Obesity, morbid ([place]) 07/01/2015  . Thrombocytopenia ([place])   . Acute ischemic VBA thalamic stroke ([place]) 01/18/2015  . Acute kidney injury superimposed on CKD ([place])   . Dysarthria   . Lethargy   . Labile blood pressure   . HLD (hyperlipidemia)   . Chronic obstructive pulmonary disease ([place])   . Hemiparesis, aphasia, and dysphagia as late effect of cerebrovascular accident (CVA) ([place])   . Acute ischemic stroke ([place])   . Right hemiplegia ([place])   . Abdominal pain 06/20/2014  . OSA on CPAP 12/08/2010  . Morbid obesity ([place]) 12/08/2010  . CAD (coronary artery disease) 12/08/2010  . Uncontrolled type 2 DM with hyperosmolar nonketotic hyperglycemia ([place]) 12/01/2010    No current facility-administered medications on file prior to encounter.    Current Outpatient Prescriptions on File Prior to Encounter  Medication Sig Dispense Refill  . Oxycodone HCl 20 MG TABS Take 1 tablet (20 mg total) by mouth 3 (three) times daily as needed. 90 tablet 0  . polyethylene glycol powder (MIRALAX) powder Take 17 g by mouth daily. (Patient taking differently: Take 17 g by mouth as needed. ) 255 g 0  . amLODipine (NORVASC) 5 MG tablet Take 1 tablet (5 mg total) by mouth at bedtime. (Patient not taking: Reported on 08/04/2016) 30 tablet 11  . atorvastatin (LIPITOR) 80 MG tablet Take 1 tablet (80 mg total) by mouth daily at 6 PM. (Patient not taking: Reported on 08/04/2016)  30 tablet 0  . Diapers & Supplies MISC Please provide patient with adult diapers and pads 100 each 12  . LYRICA 50 MG capsule Take 1 capsule (50 mg total) by mouth daily. (Patient not taking: Reported on 08/04/2016) 60 capsule 3  . Oxycodone HCl 20 MG TABS Take 1 tablet (20  mg total) by mouth 3 (three) times daily as needed. (Patient not taking: Reported on 08/04/2016) 90 tablet 0  . Oxycodone HCl 20 MG TABS Take 1 tablet (20 mg total) by mouth 3 (three) times daily as needed. (Patient not taking: Reported on 08/04/2016) 90 tablet 0  . rivaroxaban (XARELTO) 10 MG TABS tablet Take 1 tablet (10 mg total) by mouth daily. (Patient not taking: Reported on 08/04/2016) 30 tablet 3  . sodium bicarbonate 650 MG tablet Take 1 tablet (650 mg total) by mouth daily. (Patient not taking: Reported on 08/04/2016) 30 tablet 0    Past Medical History:  Diagnosis Date  . Anemia   . CHF (congestive heart failure) ([place])   . Chronic back pain   . COPD (chronic obstructive pulmonary disease) ([place])   . Coronary artery disease   . Diabetes mellitus   . DJD (degenerative joint disease)   . Emphysema   . Hypercholesteremia   . Hypertension   . Myocardial infarction ([place])   . Obesity   . Obesity hypoventilation syndrome ([place])   . Renal insufficiency   . Scoliosis   . Sleep apnea   . Stroke ([place])   . TIA (transient ischemic attack)     Past Surgical History:  Procedure Laterality Date  . COLON SURGERY    . CORONARY ANGIOPLASTY WITH STENT PLACEMENT      Social History  Substance Use Topics  . Smoking status: Never Smoker  . Smokeless tobacco: Never Used  . Alcohol use No    Family History  Problem Relation Age of Onset  . Diabetes Mother   . Stroke Maternal Aunt     PE: Vitals:   08/04/16 1403 08/04/16 1413 08/04/16 2030  BP:  (!) 200/107 (!) 194/104  Pulse:  75 88  Resp:  20 20  Temp:  98.5 F (36.9 C)   TempSrc:  Oral   SpO2:  100% 98%  Weight: 81.6 kg (180 lb)    Height: 5\' 5"  (1.651 m)     Patient Somnolent and nonverbal Atraumatic normocephalic head No cervical or supraclavicular lymphadenopathy appreciated No increased work of breathing, no audible wheezes/rhonchi Regular sinus rhythm/rate Abdomen is soft, nontender Lower extremities are symmetric  without appreciable edema Foley catheter is draining sediment his urine that does not appear to be grossly bloody No identifiable skin lesions   Recent Labs  08/04/16 1404 08/04/16 1416  WBC 11.1*  --   HGB 10.2* 9.5*  HCT 30.8* 28.0*    Recent Labs  08/04/16 1404 08/04/16 1416  NA 142 143  K 4.5 4.5  CL 114* 113*  CO2 18*  --   GLUCOSE 162* 159*  BUN 40* 49*  CREATININE 2.24* 2.20*  CALCIUM 9.1  --     Recent Labs  08/04/16 1404  INR 1.08   No results for input(s): LABURIN in the last 72 hours. Results for orders placed or performed during the hospital encounter of 02/14/16  Urine culture     Status: Abnormal   Collection Time: 02/14/16 10:20 AM  Result Value Ref Range Status   Specimen Description URINE, RANDOM  Final   Special Requests NONE  Final  Culture MULTIPLE SPECIES PRESENT, SUGGEST RECOLLECTION (A)  Final   Report Status 02/15/2016 FINAL  Final    Imaging: I've independently reviewed the patient's CT scan demonstrating bilateral nonobstructing stones as well as a left proximal ureteral stone with hydroureteronephrosis and perinephric stranding as well as a distal right ureteral stone with mild hydronephrosis.  Imp/Recommendations: The patient has altered mental status, nausea and vomiting, and bilateral ureteral obstruction. Fortunately her white blood cell count is not elevated and she is afebrile. She has a chronic indwelling Foley, and concern for quick progression to sepsis. Given her clinical situation, I recommended proceeding to the OR urgently for source control and placement of bilateral ureteral stents. I spoke with the family in significant detail explaining the rationale as well as the procedure itself. All family members present agreed, the DPOA signed the consent form.    Louis Meckel W

## 2016-08-18 NOTE — Anesthesia Postprocedure Evaluation (Signed)
Anesthesia Post Note  Patient: Michelle Holmes  Procedure(s) Performed: Procedure(s) (LRB): BILATERAL URETEROSCOPY WITH HOLMIUM LASER AND STENTS (Bilateral)     Patient location during evaluation: PACU Anesthesia Type: General Level of consciousness: awake and alert Pain management: pain level controlled Vital Signs Assessment: post-procedure vital signs reviewed and stable Respiratory status: spontaneous breathing, nonlabored ventilation, respiratory function stable and patient connected to nasal cannula oxygen Cardiovascular status: blood pressure returned to baseline and stable Postop Assessment: no signs of nausea or vomiting Anesthetic complications: no    Last Vitals:  Vitals:   08/18/16 1500 08/18/16 1600  BP: 124/76 130/70  Pulse: 76 83  Resp: 16 18  Temp: 36.4 C 36.5 C    Last Pain:  Vitals:   08/18/16 1600  TempSrc: Oral  PainSc:                  Ryan P Ellender

## 2016-08-19 ENCOUNTER — Encounter (HOSPITAL_COMMUNITY): Payer: Self-pay | Admitting: Urology

## 2016-08-21 DIAGNOSIS — J449 Chronic obstructive pulmonary disease, unspecified: Secondary | ICD-10-CM | POA: Diagnosis not present

## 2016-08-22 LAB — ECHOCARDIOGRAM COMPLETE
Height: 66 in
Weight: 2929.6 oz

## 2016-08-25 ENCOUNTER — Telehealth: Payer: Self-pay | Admitting: Internal Medicine

## 2016-08-25 NOTE — Telephone Encounter (Signed)
[facility] requesting to resume orders for [place]. Also/Caregivers requesting Cath Change.  Please Call back about these VO.

## 2016-08-26 LAB — STONE ANALYSIS
Stone Weight KSTONE: 35.9 mg
Uric Acid: 100 %

## 2016-08-27 DIAGNOSIS — I69391 Dysphagia following cerebral infarction: Secondary | ICD-10-CM | POA: Diagnosis not present

## 2016-08-27 DIAGNOSIS — M1990 Unspecified osteoarthritis, unspecified site: Secondary | ICD-10-CM | POA: Diagnosis not present

## 2016-08-27 DIAGNOSIS — D631 Anemia in chronic kidney disease: Secondary | ICD-10-CM | POA: Diagnosis not present

## 2016-08-27 DIAGNOSIS — E1122 Type 2 diabetes mellitus with diabetic chronic kidney disease: Secondary | ICD-10-CM | POA: Diagnosis not present

## 2016-08-27 DIAGNOSIS — N184 Chronic kidney disease, stage 4 (severe): Secondary | ICD-10-CM | POA: Diagnosis not present

## 2016-08-27 DIAGNOSIS — J439 Emphysema, unspecified: Secondary | ICD-10-CM | POA: Diagnosis not present

## 2016-08-27 DIAGNOSIS — Z466 Encounter for fitting and adjustment of urinary device: Secondary | ICD-10-CM | POA: Diagnosis not present

## 2016-08-27 DIAGNOSIS — I69351 Hemiplegia and hemiparesis following cerebral infarction affecting right dominant side: Secondary | ICD-10-CM | POA: Diagnosis not present

## 2016-08-27 DIAGNOSIS — I2510 Atherosclerotic heart disease of native coronary artery without angina pectoris: Secondary | ICD-10-CM | POA: Diagnosis not present

## 2016-08-27 DIAGNOSIS — I5032 Chronic diastolic (congestive) heart failure: Secondary | ICD-10-CM | POA: Diagnosis not present

## 2016-08-27 DIAGNOSIS — I130 Hypertensive heart and chronic kidney disease with heart failure and stage 1 through stage 4 chronic kidney disease, or unspecified chronic kidney disease: Secondary | ICD-10-CM | POA: Diagnosis not present

## 2016-08-27 DIAGNOSIS — I69320 Aphasia following cerebral infarction: Secondary | ICD-10-CM | POA: Diagnosis not present

## 2016-08-27 DIAGNOSIS — R1310 Dysphagia, unspecified: Secondary | ICD-10-CM | POA: Diagnosis not present

## 2016-08-28 DIAGNOSIS — J439 Emphysema, unspecified: Secondary | ICD-10-CM | POA: Diagnosis not present

## 2016-08-28 DIAGNOSIS — I69320 Aphasia following cerebral infarction: Secondary | ICD-10-CM | POA: Diagnosis not present

## 2016-08-28 DIAGNOSIS — N184 Chronic kidney disease, stage 4 (severe): Secondary | ICD-10-CM | POA: Diagnosis not present

## 2016-08-28 DIAGNOSIS — I69351 Hemiplegia and hemiparesis following cerebral infarction affecting right dominant side: Secondary | ICD-10-CM | POA: Diagnosis not present

## 2016-08-28 DIAGNOSIS — Z466 Encounter for fitting and adjustment of urinary device: Secondary | ICD-10-CM | POA: Diagnosis not present

## 2016-08-28 DIAGNOSIS — I5032 Chronic diastolic (congestive) heart failure: Secondary | ICD-10-CM | POA: Diagnosis not present

## 2016-08-28 DIAGNOSIS — I130 Hypertensive heart and chronic kidney disease with heart failure and stage 1 through stage 4 chronic kidney disease, or unspecified chronic kidney disease: Secondary | ICD-10-CM | POA: Diagnosis not present

## 2016-08-28 DIAGNOSIS — I69391 Dysphagia following cerebral infarction: Secondary | ICD-10-CM | POA: Diagnosis not present

## 2016-08-28 DIAGNOSIS — M1990 Unspecified osteoarthritis, unspecified site: Secondary | ICD-10-CM | POA: Diagnosis not present

## 2016-08-28 DIAGNOSIS — D631 Anemia in chronic kidney disease: Secondary | ICD-10-CM | POA: Diagnosis not present

## 2016-08-28 DIAGNOSIS — R1310 Dysphagia, unspecified: Secondary | ICD-10-CM | POA: Diagnosis not present

## 2016-08-28 DIAGNOSIS — I2510 Atherosclerotic heart disease of native coronary artery without angina pectoris: Secondary | ICD-10-CM | POA: Diagnosis not present

## 2016-08-28 DIAGNOSIS — E1122 Type 2 diabetes mellitus with diabetic chronic kidney disease: Secondary | ICD-10-CM | POA: Diagnosis not present

## 2016-08-30 DIAGNOSIS — L89153 Pressure ulcer of sacral region, stage 3: Secondary | ICD-10-CM | POA: Diagnosis not present

## 2016-08-30 DIAGNOSIS — S91309A Unspecified open wound, unspecified foot, initial encounter: Secondary | ICD-10-CM | POA: Diagnosis not present

## 2016-09-01 DIAGNOSIS — Z7901 Long term (current) use of anticoagulants: Secondary | ICD-10-CM | POA: Diagnosis not present

## 2016-09-01 DIAGNOSIS — N184 Chronic kidney disease, stage 4 (severe): Secondary | ICD-10-CM | POA: Diagnosis not present

## 2016-09-01 DIAGNOSIS — I69320 Aphasia following cerebral infarction: Secondary | ICD-10-CM | POA: Diagnosis not present

## 2016-09-01 DIAGNOSIS — E785 Hyperlipidemia, unspecified: Secondary | ICD-10-CM | POA: Diagnosis not present

## 2016-09-01 DIAGNOSIS — D631 Anemia in chronic kidney disease: Secondary | ICD-10-CM | POA: Diagnosis not present

## 2016-09-01 DIAGNOSIS — E1122 Type 2 diabetes mellitus with diabetic chronic kidney disease: Secondary | ICD-10-CM | POA: Diagnosis not present

## 2016-09-01 DIAGNOSIS — I69351 Hemiplegia and hemiparesis following cerebral infarction affecting right dominant side: Secondary | ICD-10-CM | POA: Diagnosis not present

## 2016-09-01 DIAGNOSIS — I2510 Atherosclerotic heart disease of native coronary artery without angina pectoris: Secondary | ICD-10-CM | POA: Diagnosis not present

## 2016-09-01 DIAGNOSIS — J439 Emphysema, unspecified: Secondary | ICD-10-CM | POA: Diagnosis not present

## 2016-09-01 DIAGNOSIS — I69391 Dysphagia following cerebral infarction: Secondary | ICD-10-CM | POA: Diagnosis not present

## 2016-09-01 DIAGNOSIS — I130 Hypertensive heart and chronic kidney disease with heart failure and stage 1 through stage 4 chronic kidney disease, or unspecified chronic kidney disease: Secondary | ICD-10-CM | POA: Diagnosis not present

## 2016-09-01 DIAGNOSIS — R1310 Dysphagia, unspecified: Secondary | ICD-10-CM | POA: Diagnosis not present

## 2016-09-01 DIAGNOSIS — Z466 Encounter for fitting and adjustment of urinary device: Secondary | ICD-10-CM | POA: Diagnosis not present

## 2016-09-01 DIAGNOSIS — I5032 Chronic diastolic (congestive) heart failure: Secondary | ICD-10-CM | POA: Diagnosis not present

## 2016-09-01 DIAGNOSIS — Z86718 Personal history of other venous thrombosis and embolism: Secondary | ICD-10-CM | POA: Diagnosis not present

## 2016-09-01 DIAGNOSIS — G4733 Obstructive sleep apnea (adult) (pediatric): Secondary | ICD-10-CM | POA: Diagnosis not present

## 2016-09-01 DIAGNOSIS — M1990 Unspecified osteoarthritis, unspecified site: Secondary | ICD-10-CM | POA: Diagnosis not present

## 2016-09-02 DIAGNOSIS — N200 Calculus of kidney: Secondary | ICD-10-CM | POA: Diagnosis not present

## 2016-09-03 DIAGNOSIS — I130 Hypertensive heart and chronic kidney disease with heart failure and stage 1 through stage 4 chronic kidney disease, or unspecified chronic kidney disease: Secondary | ICD-10-CM | POA: Diagnosis not present

## 2016-09-03 DIAGNOSIS — Z466 Encounter for fitting and adjustment of urinary device: Secondary | ICD-10-CM | POA: Diagnosis not present

## 2016-09-03 DIAGNOSIS — G4733 Obstructive sleep apnea (adult) (pediatric): Secondary | ICD-10-CM | POA: Diagnosis not present

## 2016-09-03 DIAGNOSIS — I69320 Aphasia following cerebral infarction: Secondary | ICD-10-CM | POA: Diagnosis not present

## 2016-09-03 DIAGNOSIS — N184 Chronic kidney disease, stage 4 (severe): Secondary | ICD-10-CM | POA: Diagnosis not present

## 2016-09-03 DIAGNOSIS — I69351 Hemiplegia and hemiparesis following cerebral infarction affecting right dominant side: Secondary | ICD-10-CM | POA: Diagnosis not present

## 2016-09-03 DIAGNOSIS — R1310 Dysphagia, unspecified: Secondary | ICD-10-CM | POA: Diagnosis not present

## 2016-09-03 DIAGNOSIS — Z86718 Personal history of other venous thrombosis and embolism: Secondary | ICD-10-CM | POA: Diagnosis not present

## 2016-09-03 DIAGNOSIS — E1122 Type 2 diabetes mellitus with diabetic chronic kidney disease: Secondary | ICD-10-CM | POA: Diagnosis not present

## 2016-09-03 DIAGNOSIS — I69391 Dysphagia following cerebral infarction: Secondary | ICD-10-CM | POA: Diagnosis not present

## 2016-09-03 DIAGNOSIS — J439 Emphysema, unspecified: Secondary | ICD-10-CM | POA: Diagnosis not present

## 2016-09-03 DIAGNOSIS — D631 Anemia in chronic kidney disease: Secondary | ICD-10-CM | POA: Diagnosis not present

## 2016-09-03 DIAGNOSIS — Z7901 Long term (current) use of anticoagulants: Secondary | ICD-10-CM | POA: Diagnosis not present

## 2016-09-03 DIAGNOSIS — M1990 Unspecified osteoarthritis, unspecified site: Secondary | ICD-10-CM | POA: Diagnosis not present

## 2016-09-03 DIAGNOSIS — E785 Hyperlipidemia, unspecified: Secondary | ICD-10-CM | POA: Diagnosis not present

## 2016-09-03 DIAGNOSIS — I2510 Atherosclerotic heart disease of native coronary artery without angina pectoris: Secondary | ICD-10-CM | POA: Diagnosis not present

## 2016-09-03 DIAGNOSIS — I5032 Chronic diastolic (congestive) heart failure: Secondary | ICD-10-CM | POA: Diagnosis not present

## 2016-09-03 NOTE — Telephone Encounter (Signed)
done

## 2016-09-07 DIAGNOSIS — I2510 Atherosclerotic heart disease of native coronary artery without angina pectoris: Secondary | ICD-10-CM | POA: Diagnosis not present

## 2016-09-07 DIAGNOSIS — E1122 Type 2 diabetes mellitus with diabetic chronic kidney disease: Secondary | ICD-10-CM | POA: Diagnosis not present

## 2016-09-07 DIAGNOSIS — J439 Emphysema, unspecified: Secondary | ICD-10-CM | POA: Diagnosis not present

## 2016-09-07 DIAGNOSIS — Z86718 Personal history of other venous thrombosis and embolism: Secondary | ICD-10-CM | POA: Diagnosis not present

## 2016-09-07 DIAGNOSIS — M1990 Unspecified osteoarthritis, unspecified site: Secondary | ICD-10-CM | POA: Diagnosis not present

## 2016-09-07 DIAGNOSIS — G4733 Obstructive sleep apnea (adult) (pediatric): Secondary | ICD-10-CM | POA: Diagnosis not present

## 2016-09-07 DIAGNOSIS — I69320 Aphasia following cerebral infarction: Secondary | ICD-10-CM | POA: Diagnosis not present

## 2016-09-07 DIAGNOSIS — I5032 Chronic diastolic (congestive) heart failure: Secondary | ICD-10-CM | POA: Diagnosis not present

## 2016-09-07 DIAGNOSIS — I69391 Dysphagia following cerebral infarction: Secondary | ICD-10-CM | POA: Diagnosis not present

## 2016-09-07 DIAGNOSIS — I69351 Hemiplegia and hemiparesis following cerebral infarction affecting right dominant side: Secondary | ICD-10-CM | POA: Diagnosis not present

## 2016-09-07 DIAGNOSIS — Z466 Encounter for fitting and adjustment of urinary device: Secondary | ICD-10-CM | POA: Diagnosis not present

## 2016-09-07 DIAGNOSIS — Z7901 Long term (current) use of anticoagulants: Secondary | ICD-10-CM | POA: Diagnosis not present

## 2016-09-07 DIAGNOSIS — I130 Hypertensive heart and chronic kidney disease with heart failure and stage 1 through stage 4 chronic kidney disease, or unspecified chronic kidney disease: Secondary | ICD-10-CM | POA: Diagnosis not present

## 2016-09-07 DIAGNOSIS — D631 Anemia in chronic kidney disease: Secondary | ICD-10-CM | POA: Diagnosis not present

## 2016-09-07 DIAGNOSIS — N184 Chronic kidney disease, stage 4 (severe): Secondary | ICD-10-CM | POA: Diagnosis not present

## 2016-09-07 DIAGNOSIS — E785 Hyperlipidemia, unspecified: Secondary | ICD-10-CM | POA: Diagnosis not present

## 2016-09-07 DIAGNOSIS — R1310 Dysphagia, unspecified: Secondary | ICD-10-CM | POA: Diagnosis not present

## 2016-09-12 DIAGNOSIS — J439 Emphysema, unspecified: Secondary | ICD-10-CM | POA: Diagnosis not present

## 2016-09-12 DIAGNOSIS — I69320 Aphasia following cerebral infarction: Secondary | ICD-10-CM | POA: Diagnosis not present

## 2016-09-12 DIAGNOSIS — G4733 Obstructive sleep apnea (adult) (pediatric): Secondary | ICD-10-CM | POA: Diagnosis not present

## 2016-09-12 DIAGNOSIS — D631 Anemia in chronic kidney disease: Secondary | ICD-10-CM | POA: Diagnosis not present

## 2016-09-12 DIAGNOSIS — I69351 Hemiplegia and hemiparesis following cerebral infarction affecting right dominant side: Secondary | ICD-10-CM | POA: Diagnosis not present

## 2016-09-12 DIAGNOSIS — N132 Hydronephrosis with renal and ureteral calculous obstruction: Secondary | ICD-10-CM | POA: Diagnosis not present

## 2016-09-12 DIAGNOSIS — I5032 Chronic diastolic (congestive) heart failure: Secondary | ICD-10-CM | POA: Diagnosis not present

## 2016-09-12 DIAGNOSIS — I2510 Atherosclerotic heart disease of native coronary artery without angina pectoris: Secondary | ICD-10-CM | POA: Diagnosis not present

## 2016-09-12 DIAGNOSIS — E1122 Type 2 diabetes mellitus with diabetic chronic kidney disease: Secondary | ICD-10-CM | POA: Diagnosis not present

## 2016-09-12 DIAGNOSIS — L89322 Pressure ulcer of left buttock, stage 2: Secondary | ICD-10-CM | POA: Diagnosis not present

## 2016-09-12 DIAGNOSIS — N184 Chronic kidney disease, stage 4 (severe): Secondary | ICD-10-CM | POA: Diagnosis not present

## 2016-09-12 DIAGNOSIS — I130 Hypertensive heart and chronic kidney disease with heart failure and stage 1 through stage 4 chronic kidney disease, or unspecified chronic kidney disease: Secondary | ICD-10-CM | POA: Diagnosis not present

## 2016-09-12 DIAGNOSIS — M1990 Unspecified osteoarthritis, unspecified site: Secondary | ICD-10-CM | POA: Diagnosis not present

## 2016-09-12 DIAGNOSIS — R1310 Dysphagia, unspecified: Secondary | ICD-10-CM | POA: Diagnosis not present

## 2016-09-12 DIAGNOSIS — N390 Urinary tract infection, site not specified: Secondary | ICD-10-CM | POA: Diagnosis not present

## 2016-09-12 DIAGNOSIS — E43 Unspecified severe protein-calorie malnutrition: Secondary | ICD-10-CM | POA: Diagnosis not present

## 2016-09-12 DIAGNOSIS — Z466 Encounter for fitting and adjustment of urinary device: Secondary | ICD-10-CM | POA: Diagnosis not present

## 2016-09-12 DIAGNOSIS — E785 Hyperlipidemia, unspecified: Secondary | ICD-10-CM | POA: Diagnosis not present

## 2016-09-12 DIAGNOSIS — Z792 Long term (current) use of antibiotics: Secondary | ICD-10-CM | POA: Diagnosis not present

## 2016-09-12 DIAGNOSIS — I69391 Dysphagia following cerebral infarction: Secondary | ICD-10-CM | POA: Diagnosis not present

## 2016-09-12 DIAGNOSIS — Z86718 Personal history of other venous thrombosis and embolism: Secondary | ICD-10-CM | POA: Diagnosis not present

## 2016-09-12 DIAGNOSIS — Z9181 History of falling: Secondary | ICD-10-CM | POA: Diagnosis not present

## 2016-09-13 DIAGNOSIS — I5040 Unspecified combined systolic (congestive) and diastolic (congestive) heart failure: Secondary | ICD-10-CM | POA: Diagnosis not present

## 2016-09-14 DIAGNOSIS — Z792 Long term (current) use of antibiotics: Secondary | ICD-10-CM | POA: Diagnosis not present

## 2016-09-14 DIAGNOSIS — J439 Emphysema, unspecified: Secondary | ICD-10-CM | POA: Diagnosis not present

## 2016-09-14 DIAGNOSIS — Z86718 Personal history of other venous thrombosis and embolism: Secondary | ICD-10-CM | POA: Diagnosis not present

## 2016-09-14 DIAGNOSIS — I69391 Dysphagia following cerebral infarction: Secondary | ICD-10-CM | POA: Diagnosis not present

## 2016-09-14 DIAGNOSIS — N390 Urinary tract infection, site not specified: Secondary | ICD-10-CM | POA: Diagnosis not present

## 2016-09-14 DIAGNOSIS — M1990 Unspecified osteoarthritis, unspecified site: Secondary | ICD-10-CM | POA: Diagnosis not present

## 2016-09-14 DIAGNOSIS — G4733 Obstructive sleep apnea (adult) (pediatric): Secondary | ICD-10-CM | POA: Diagnosis not present

## 2016-09-14 DIAGNOSIS — I130 Hypertensive heart and chronic kidney disease with heart failure and stage 1 through stage 4 chronic kidney disease, or unspecified chronic kidney disease: Secondary | ICD-10-CM | POA: Diagnosis not present

## 2016-09-14 DIAGNOSIS — I5032 Chronic diastolic (congestive) heart failure: Secondary | ICD-10-CM | POA: Diagnosis not present

## 2016-09-14 DIAGNOSIS — N132 Hydronephrosis with renal and ureteral calculous obstruction: Secondary | ICD-10-CM | POA: Diagnosis not present

## 2016-09-14 DIAGNOSIS — E43 Unspecified severe protein-calorie malnutrition: Secondary | ICD-10-CM | POA: Diagnosis not present

## 2016-09-14 DIAGNOSIS — N184 Chronic kidney disease, stage 4 (severe): Secondary | ICD-10-CM | POA: Diagnosis not present

## 2016-09-14 DIAGNOSIS — I69320 Aphasia following cerebral infarction: Secondary | ICD-10-CM | POA: Diagnosis not present

## 2016-09-14 DIAGNOSIS — Z466 Encounter for fitting and adjustment of urinary device: Secondary | ICD-10-CM | POA: Diagnosis not present

## 2016-09-14 DIAGNOSIS — D631 Anemia in chronic kidney disease: Secondary | ICD-10-CM | POA: Diagnosis not present

## 2016-09-14 DIAGNOSIS — R1310 Dysphagia, unspecified: Secondary | ICD-10-CM | POA: Diagnosis not present

## 2016-09-14 DIAGNOSIS — L89322 Pressure ulcer of left buttock, stage 2: Secondary | ICD-10-CM | POA: Diagnosis not present

## 2016-09-14 DIAGNOSIS — Z9181 History of falling: Secondary | ICD-10-CM | POA: Diagnosis not present

## 2016-09-14 DIAGNOSIS — I2510 Atherosclerotic heart disease of native coronary artery without angina pectoris: Secondary | ICD-10-CM | POA: Diagnosis not present

## 2016-09-14 DIAGNOSIS — I69351 Hemiplegia and hemiparesis following cerebral infarction affecting right dominant side: Secondary | ICD-10-CM | POA: Diagnosis not present

## 2016-09-14 DIAGNOSIS — E1122 Type 2 diabetes mellitus with diabetic chronic kidney disease: Secondary | ICD-10-CM | POA: Diagnosis not present

## 2016-09-14 DIAGNOSIS — E785 Hyperlipidemia, unspecified: Secondary | ICD-10-CM | POA: Diagnosis not present

## 2016-09-16 DIAGNOSIS — I69351 Hemiplegia and hemiparesis following cerebral infarction affecting right dominant side: Secondary | ICD-10-CM | POA: Diagnosis not present

## 2016-09-16 DIAGNOSIS — N390 Urinary tract infection, site not specified: Secondary | ICD-10-CM | POA: Diagnosis not present

## 2016-09-16 DIAGNOSIS — E1122 Type 2 diabetes mellitus with diabetic chronic kidney disease: Secondary | ICD-10-CM | POA: Diagnosis not present

## 2016-09-16 DIAGNOSIS — I2510 Atherosclerotic heart disease of native coronary artery without angina pectoris: Secondary | ICD-10-CM | POA: Diagnosis not present

## 2016-09-16 DIAGNOSIS — N184 Chronic kidney disease, stage 4 (severe): Secondary | ICD-10-CM | POA: Diagnosis not present

## 2016-09-16 DIAGNOSIS — N132 Hydronephrosis with renal and ureteral calculous obstruction: Secondary | ICD-10-CM | POA: Diagnosis not present

## 2016-09-16 DIAGNOSIS — E43 Unspecified severe protein-calorie malnutrition: Secondary | ICD-10-CM | POA: Diagnosis not present

## 2016-09-16 DIAGNOSIS — I130 Hypertensive heart and chronic kidney disease with heart failure and stage 1 through stage 4 chronic kidney disease, or unspecified chronic kidney disease: Secondary | ICD-10-CM | POA: Diagnosis not present

## 2016-09-16 DIAGNOSIS — Z86718 Personal history of other venous thrombosis and embolism: Secondary | ICD-10-CM | POA: Diagnosis not present

## 2016-09-16 DIAGNOSIS — E785 Hyperlipidemia, unspecified: Secondary | ICD-10-CM | POA: Diagnosis not present

## 2016-09-16 DIAGNOSIS — L89322 Pressure ulcer of left buttock, stage 2: Secondary | ICD-10-CM | POA: Diagnosis not present

## 2016-09-16 DIAGNOSIS — G4733 Obstructive sleep apnea (adult) (pediatric): Secondary | ICD-10-CM | POA: Diagnosis not present

## 2016-09-16 DIAGNOSIS — I69391 Dysphagia following cerebral infarction: Secondary | ICD-10-CM | POA: Diagnosis not present

## 2016-09-16 DIAGNOSIS — Z466 Encounter for fitting and adjustment of urinary device: Secondary | ICD-10-CM | POA: Diagnosis not present

## 2016-09-16 DIAGNOSIS — M1990 Unspecified osteoarthritis, unspecified site: Secondary | ICD-10-CM | POA: Diagnosis not present

## 2016-09-16 DIAGNOSIS — R1310 Dysphagia, unspecified: Secondary | ICD-10-CM | POA: Diagnosis not present

## 2016-09-16 DIAGNOSIS — Z9181 History of falling: Secondary | ICD-10-CM | POA: Diagnosis not present

## 2016-09-16 DIAGNOSIS — I69320 Aphasia following cerebral infarction: Secondary | ICD-10-CM | POA: Diagnosis not present

## 2016-09-16 DIAGNOSIS — Z792 Long term (current) use of antibiotics: Secondary | ICD-10-CM | POA: Diagnosis not present

## 2016-09-16 DIAGNOSIS — I5032 Chronic diastolic (congestive) heart failure: Secondary | ICD-10-CM | POA: Diagnosis not present

## 2016-09-16 DIAGNOSIS — J439 Emphysema, unspecified: Secondary | ICD-10-CM | POA: Diagnosis not present

## 2016-09-16 DIAGNOSIS — D631 Anemia in chronic kidney disease: Secondary | ICD-10-CM | POA: Diagnosis not present

## 2016-09-18 DIAGNOSIS — N132 Hydronephrosis with renal and ureteral calculous obstruction: Secondary | ICD-10-CM | POA: Diagnosis not present

## 2016-09-18 DIAGNOSIS — R1310 Dysphagia, unspecified: Secondary | ICD-10-CM | POA: Diagnosis not present

## 2016-09-18 DIAGNOSIS — I69351 Hemiplegia and hemiparesis following cerebral infarction affecting right dominant side: Secondary | ICD-10-CM | POA: Diagnosis not present

## 2016-09-18 DIAGNOSIS — E43 Unspecified severe protein-calorie malnutrition: Secondary | ICD-10-CM | POA: Diagnosis not present

## 2016-09-18 DIAGNOSIS — I69320 Aphasia following cerebral infarction: Secondary | ICD-10-CM | POA: Diagnosis not present

## 2016-09-18 DIAGNOSIS — J439 Emphysema, unspecified: Secondary | ICD-10-CM | POA: Diagnosis not present

## 2016-09-18 DIAGNOSIS — G4733 Obstructive sleep apnea (adult) (pediatric): Secondary | ICD-10-CM | POA: Diagnosis not present

## 2016-09-18 DIAGNOSIS — L89322 Pressure ulcer of left buttock, stage 2: Secondary | ICD-10-CM | POA: Diagnosis not present

## 2016-09-18 DIAGNOSIS — Z466 Encounter for fitting and adjustment of urinary device: Secondary | ICD-10-CM | POA: Diagnosis not present

## 2016-09-18 DIAGNOSIS — I69391 Dysphagia following cerebral infarction: Secondary | ICD-10-CM | POA: Diagnosis not present

## 2016-09-18 DIAGNOSIS — Z86718 Personal history of other venous thrombosis and embolism: Secondary | ICD-10-CM | POA: Diagnosis not present

## 2016-09-18 DIAGNOSIS — E1122 Type 2 diabetes mellitus with diabetic chronic kidney disease: Secondary | ICD-10-CM | POA: Diagnosis not present

## 2016-09-18 DIAGNOSIS — Z792 Long term (current) use of antibiotics: Secondary | ICD-10-CM | POA: Diagnosis not present

## 2016-09-18 DIAGNOSIS — Z9181 History of falling: Secondary | ICD-10-CM | POA: Diagnosis not present

## 2016-09-18 DIAGNOSIS — N390 Urinary tract infection, site not specified: Secondary | ICD-10-CM | POA: Diagnosis not present

## 2016-09-18 DIAGNOSIS — E785 Hyperlipidemia, unspecified: Secondary | ICD-10-CM | POA: Diagnosis not present

## 2016-09-18 DIAGNOSIS — D631 Anemia in chronic kidney disease: Secondary | ICD-10-CM | POA: Diagnosis not present

## 2016-09-18 DIAGNOSIS — N184 Chronic kidney disease, stage 4 (severe): Secondary | ICD-10-CM | POA: Diagnosis not present

## 2016-09-18 DIAGNOSIS — I5032 Chronic diastolic (congestive) heart failure: Secondary | ICD-10-CM | POA: Diagnosis not present

## 2016-09-18 DIAGNOSIS — I130 Hypertensive heart and chronic kidney disease with heart failure and stage 1 through stage 4 chronic kidney disease, or unspecified chronic kidney disease: Secondary | ICD-10-CM | POA: Diagnosis not present

## 2016-09-18 DIAGNOSIS — I2510 Atherosclerotic heart disease of native coronary artery without angina pectoris: Secondary | ICD-10-CM | POA: Diagnosis not present

## 2016-09-18 DIAGNOSIS — M1990 Unspecified osteoarthritis, unspecified site: Secondary | ICD-10-CM | POA: Diagnosis not present

## 2016-09-21 DIAGNOSIS — J449 Chronic obstructive pulmonary disease, unspecified: Secondary | ICD-10-CM | POA: Diagnosis not present

## 2016-09-21 NOTE — Addendum Note (Signed)
Addended by: Truddie Crumble on: 09/21/2016 03:42 PM   Modules accepted: Orders

## 2016-09-22 ENCOUNTER — Other Ambulatory Visit: Payer: Self-pay | Admitting: Internal Medicine

## 2016-09-22 DIAGNOSIS — Z466 Encounter for fitting and adjustment of urinary device: Secondary | ICD-10-CM | POA: Diagnosis not present

## 2016-09-22 DIAGNOSIS — I69391 Dysphagia following cerebral infarction: Secondary | ICD-10-CM | POA: Diagnosis not present

## 2016-09-22 DIAGNOSIS — J439 Emphysema, unspecified: Secondary | ICD-10-CM | POA: Diagnosis not present

## 2016-09-22 DIAGNOSIS — N184 Chronic kidney disease, stage 4 (severe): Secondary | ICD-10-CM | POA: Diagnosis not present

## 2016-09-22 DIAGNOSIS — I5032 Chronic diastolic (congestive) heart failure: Secondary | ICD-10-CM | POA: Diagnosis not present

## 2016-09-22 DIAGNOSIS — Z792 Long term (current) use of antibiotics: Secondary | ICD-10-CM | POA: Diagnosis not present

## 2016-09-22 DIAGNOSIS — N390 Urinary tract infection, site not specified: Secondary | ICD-10-CM | POA: Diagnosis not present

## 2016-09-22 DIAGNOSIS — I2510 Atherosclerotic heart disease of native coronary artery without angina pectoris: Secondary | ICD-10-CM | POA: Diagnosis not present

## 2016-09-22 DIAGNOSIS — R1310 Dysphagia, unspecified: Secondary | ICD-10-CM | POA: Diagnosis not present

## 2016-09-22 DIAGNOSIS — E1122 Type 2 diabetes mellitus with diabetic chronic kidney disease: Secondary | ICD-10-CM | POA: Diagnosis not present

## 2016-09-22 DIAGNOSIS — Z86718 Personal history of other venous thrombosis and embolism: Secondary | ICD-10-CM | POA: Diagnosis not present

## 2016-09-22 DIAGNOSIS — I69351 Hemiplegia and hemiparesis following cerebral infarction affecting right dominant side: Secondary | ICD-10-CM | POA: Diagnosis not present

## 2016-09-22 DIAGNOSIS — I130 Hypertensive heart and chronic kidney disease with heart failure and stage 1 through stage 4 chronic kidney disease, or unspecified chronic kidney disease: Secondary | ICD-10-CM | POA: Diagnosis not present

## 2016-09-22 DIAGNOSIS — E43 Unspecified severe protein-calorie malnutrition: Secondary | ICD-10-CM | POA: Diagnosis not present

## 2016-09-22 DIAGNOSIS — E785 Hyperlipidemia, unspecified: Secondary | ICD-10-CM | POA: Diagnosis not present

## 2016-09-22 DIAGNOSIS — M1990 Unspecified osteoarthritis, unspecified site: Secondary | ICD-10-CM | POA: Diagnosis not present

## 2016-09-22 DIAGNOSIS — D631 Anemia in chronic kidney disease: Secondary | ICD-10-CM | POA: Diagnosis not present

## 2016-09-22 DIAGNOSIS — I69320 Aphasia following cerebral infarction: Secondary | ICD-10-CM | POA: Diagnosis not present

## 2016-09-22 DIAGNOSIS — G4733 Obstructive sleep apnea (adult) (pediatric): Secondary | ICD-10-CM | POA: Diagnosis not present

## 2016-09-22 DIAGNOSIS — L89322 Pressure ulcer of left buttock, stage 2: Secondary | ICD-10-CM | POA: Diagnosis not present

## 2016-09-22 DIAGNOSIS — N132 Hydronephrosis with renal and ureteral calculous obstruction: Secondary | ICD-10-CM | POA: Diagnosis not present

## 2016-09-22 DIAGNOSIS — Z9181 History of falling: Secondary | ICD-10-CM | POA: Diagnosis not present

## 2016-09-22 MED ORDER — INSULIN ASPART 100 UNIT/ML ~~LOC~~ SOLN: 10.0000 [IU] | SUBCUTANEOUS | 3 refills | Status: DC | PRN

## 2016-09-22 NOTE — Telephone Encounter (Signed)
Called pt to inform Novolog rx was refilled per Dr [name] - talked to her daughter.

## 2016-09-22 NOTE — Telephone Encounter (Signed)
insulin aspart (NOVOLOG) 100 UNIT/ML injection AT  [facility] - [place], [place] - [address] AT [address]      Pt is completely out of medicine.

## 2016-09-24 ENCOUNTER — Other Ambulatory Visit: Payer: Self-pay | Admitting: Internal Medicine

## 2016-09-24 DIAGNOSIS — Z466 Encounter for fitting and adjustment of urinary device: Secondary | ICD-10-CM | POA: Diagnosis not present

## 2016-09-24 DIAGNOSIS — G4733 Obstructive sleep apnea (adult) (pediatric): Secondary | ICD-10-CM | POA: Diagnosis not present

## 2016-09-24 DIAGNOSIS — N390 Urinary tract infection, site not specified: Secondary | ICD-10-CM | POA: Diagnosis not present

## 2016-09-24 DIAGNOSIS — E1122 Type 2 diabetes mellitus with diabetic chronic kidney disease: Secondary | ICD-10-CM | POA: Diagnosis not present

## 2016-09-24 DIAGNOSIS — E785 Hyperlipidemia, unspecified: Secondary | ICD-10-CM | POA: Diagnosis not present

## 2016-09-24 DIAGNOSIS — L89322 Pressure ulcer of left buttock, stage 2: Secondary | ICD-10-CM | POA: Diagnosis not present

## 2016-09-24 DIAGNOSIS — I69391 Dysphagia following cerebral infarction: Secondary | ICD-10-CM | POA: Diagnosis not present

## 2016-09-24 DIAGNOSIS — D631 Anemia in chronic kidney disease: Secondary | ICD-10-CM | POA: Diagnosis not present

## 2016-09-24 DIAGNOSIS — M1990 Unspecified osteoarthritis, unspecified site: Secondary | ICD-10-CM | POA: Diagnosis not present

## 2016-09-24 DIAGNOSIS — I5032 Chronic diastolic (congestive) heart failure: Secondary | ICD-10-CM | POA: Diagnosis not present

## 2016-09-24 DIAGNOSIS — N184 Chronic kidney disease, stage 4 (severe): Secondary | ICD-10-CM | POA: Diagnosis not present

## 2016-09-24 DIAGNOSIS — R1310 Dysphagia, unspecified: Secondary | ICD-10-CM | POA: Diagnosis not present

## 2016-09-24 DIAGNOSIS — Z86718 Personal history of other venous thrombosis and embolism: Secondary | ICD-10-CM | POA: Diagnosis not present

## 2016-09-24 DIAGNOSIS — Z9181 History of falling: Secondary | ICD-10-CM | POA: Diagnosis not present

## 2016-09-24 DIAGNOSIS — J439 Emphysema, unspecified: Secondary | ICD-10-CM | POA: Diagnosis not present

## 2016-09-24 DIAGNOSIS — Z792 Long term (current) use of antibiotics: Secondary | ICD-10-CM | POA: Diagnosis not present

## 2016-09-24 DIAGNOSIS — I2510 Atherosclerotic heart disease of native coronary artery without angina pectoris: Secondary | ICD-10-CM | POA: Diagnosis not present

## 2016-09-24 DIAGNOSIS — N132 Hydronephrosis with renal and ureteral calculous obstruction: Secondary | ICD-10-CM | POA: Diagnosis not present

## 2016-09-24 DIAGNOSIS — I69320 Aphasia following cerebral infarction: Secondary | ICD-10-CM | POA: Diagnosis not present

## 2016-09-24 DIAGNOSIS — I130 Hypertensive heart and chronic kidney disease with heart failure and stage 1 through stage 4 chronic kidney disease, or unspecified chronic kidney disease: Secondary | ICD-10-CM | POA: Diagnosis not present

## 2016-09-24 DIAGNOSIS — I69351 Hemiplegia and hemiparesis following cerebral infarction affecting right dominant side: Secondary | ICD-10-CM | POA: Diagnosis not present

## 2016-09-24 DIAGNOSIS — E43 Unspecified severe protein-calorie malnutrition: Secondary | ICD-10-CM | POA: Diagnosis not present

## 2016-09-24 MED ORDER — LANTUS SOLOSTAR 100 UNIT/ML ~~LOC~~ SOPN: 10.0000 [IU] | PEN_INJECTOR | Freq: Every day | SUBCUTANEOUS | 0 refills | Status: DC

## 2016-09-24 NOTE — Telephone Encounter (Signed)
NEEDS REFILL ON LANTUS PEN AT [facility] [phone]

## 2016-09-25 DIAGNOSIS — E43 Unspecified severe protein-calorie malnutrition: Secondary | ICD-10-CM | POA: Diagnosis not present

## 2016-09-25 DIAGNOSIS — Z792 Long term (current) use of antibiotics: Secondary | ICD-10-CM | POA: Diagnosis not present

## 2016-09-25 DIAGNOSIS — I2510 Atherosclerotic heart disease of native coronary artery without angina pectoris: Secondary | ICD-10-CM | POA: Diagnosis not present

## 2016-09-25 DIAGNOSIS — N132 Hydronephrosis with renal and ureteral calculous obstruction: Secondary | ICD-10-CM | POA: Diagnosis not present

## 2016-09-25 DIAGNOSIS — N390 Urinary tract infection, site not specified: Secondary | ICD-10-CM | POA: Diagnosis not present

## 2016-09-25 DIAGNOSIS — E785 Hyperlipidemia, unspecified: Secondary | ICD-10-CM | POA: Diagnosis not present

## 2016-09-25 DIAGNOSIS — M1990 Unspecified osteoarthritis, unspecified site: Secondary | ICD-10-CM | POA: Diagnosis not present

## 2016-09-25 DIAGNOSIS — L89322 Pressure ulcer of left buttock, stage 2: Secondary | ICD-10-CM | POA: Diagnosis not present

## 2016-09-25 DIAGNOSIS — I69320 Aphasia following cerebral infarction: Secondary | ICD-10-CM | POA: Diagnosis not present

## 2016-09-25 DIAGNOSIS — Z86718 Personal history of other venous thrombosis and embolism: Secondary | ICD-10-CM | POA: Diagnosis not present

## 2016-09-25 DIAGNOSIS — R1310 Dysphagia, unspecified: Secondary | ICD-10-CM | POA: Diagnosis not present

## 2016-09-25 DIAGNOSIS — Z9181 History of falling: Secondary | ICD-10-CM | POA: Diagnosis not present

## 2016-09-25 DIAGNOSIS — Z466 Encounter for fitting and adjustment of urinary device: Secondary | ICD-10-CM | POA: Diagnosis not present

## 2016-09-25 DIAGNOSIS — I69391 Dysphagia following cerebral infarction: Secondary | ICD-10-CM | POA: Diagnosis not present

## 2016-09-25 DIAGNOSIS — G4733 Obstructive sleep apnea (adult) (pediatric): Secondary | ICD-10-CM | POA: Diagnosis not present

## 2016-09-25 DIAGNOSIS — N184 Chronic kidney disease, stage 4 (severe): Secondary | ICD-10-CM | POA: Diagnosis not present

## 2016-09-25 DIAGNOSIS — E1122 Type 2 diabetes mellitus with diabetic chronic kidney disease: Secondary | ICD-10-CM | POA: Diagnosis not present

## 2016-09-25 DIAGNOSIS — D631 Anemia in chronic kidney disease: Secondary | ICD-10-CM | POA: Diagnosis not present

## 2016-09-25 DIAGNOSIS — I69351 Hemiplegia and hemiparesis following cerebral infarction affecting right dominant side: Secondary | ICD-10-CM | POA: Diagnosis not present

## 2016-09-25 DIAGNOSIS — I5032 Chronic diastolic (congestive) heart failure: Secondary | ICD-10-CM | POA: Diagnosis not present

## 2016-09-25 DIAGNOSIS — I130 Hypertensive heart and chronic kidney disease with heart failure and stage 1 through stage 4 chronic kidney disease, or unspecified chronic kidney disease: Secondary | ICD-10-CM | POA: Diagnosis not present

## 2016-09-25 DIAGNOSIS — J439 Emphysema, unspecified: Secondary | ICD-10-CM | POA: Diagnosis not present

## 2016-09-29 DIAGNOSIS — N390 Urinary tract infection, site not specified: Secondary | ICD-10-CM | POA: Diagnosis not present

## 2016-09-29 DIAGNOSIS — N184 Chronic kidney disease, stage 4 (severe): Secondary | ICD-10-CM | POA: Diagnosis not present

## 2016-09-29 DIAGNOSIS — R1310 Dysphagia, unspecified: Secondary | ICD-10-CM | POA: Diagnosis not present

## 2016-09-29 DIAGNOSIS — J439 Emphysema, unspecified: Secondary | ICD-10-CM | POA: Diagnosis not present

## 2016-09-29 DIAGNOSIS — Z9181 History of falling: Secondary | ICD-10-CM | POA: Diagnosis not present

## 2016-09-29 DIAGNOSIS — Z86718 Personal history of other venous thrombosis and embolism: Secondary | ICD-10-CM | POA: Diagnosis not present

## 2016-09-29 DIAGNOSIS — E1122 Type 2 diabetes mellitus with diabetic chronic kidney disease: Secondary | ICD-10-CM | POA: Diagnosis not present

## 2016-09-29 DIAGNOSIS — I130 Hypertensive heart and chronic kidney disease with heart failure and stage 1 through stage 4 chronic kidney disease, or unspecified chronic kidney disease: Secondary | ICD-10-CM | POA: Diagnosis not present

## 2016-09-29 DIAGNOSIS — E785 Hyperlipidemia, unspecified: Secondary | ICD-10-CM | POA: Diagnosis not present

## 2016-09-29 DIAGNOSIS — L89322 Pressure ulcer of left buttock, stage 2: Secondary | ICD-10-CM | POA: Diagnosis not present

## 2016-09-29 DIAGNOSIS — I69320 Aphasia following cerebral infarction: Secondary | ICD-10-CM | POA: Diagnosis not present

## 2016-09-29 DIAGNOSIS — D631 Anemia in chronic kidney disease: Secondary | ICD-10-CM | POA: Diagnosis not present

## 2016-09-29 DIAGNOSIS — I5032 Chronic diastolic (congestive) heart failure: Secondary | ICD-10-CM | POA: Diagnosis not present

## 2016-09-29 DIAGNOSIS — I69351 Hemiplegia and hemiparesis following cerebral infarction affecting right dominant side: Secondary | ICD-10-CM | POA: Diagnosis not present

## 2016-09-29 DIAGNOSIS — G4733 Obstructive sleep apnea (adult) (pediatric): Secondary | ICD-10-CM | POA: Diagnosis not present

## 2016-09-29 DIAGNOSIS — N132 Hydronephrosis with renal and ureteral calculous obstruction: Secondary | ICD-10-CM | POA: Diagnosis not present

## 2016-09-29 DIAGNOSIS — Z792 Long term (current) use of antibiotics: Secondary | ICD-10-CM | POA: Diagnosis not present

## 2016-09-29 DIAGNOSIS — Z466 Encounter for fitting and adjustment of urinary device: Secondary | ICD-10-CM | POA: Diagnosis not present

## 2016-09-29 DIAGNOSIS — M1990 Unspecified osteoarthritis, unspecified site: Secondary | ICD-10-CM | POA: Diagnosis not present

## 2016-09-29 DIAGNOSIS — I2510 Atherosclerotic heart disease of native coronary artery without angina pectoris: Secondary | ICD-10-CM | POA: Diagnosis not present

## 2016-09-29 DIAGNOSIS — I69391 Dysphagia following cerebral infarction: Secondary | ICD-10-CM | POA: Diagnosis not present

## 2016-09-29 DIAGNOSIS — E43 Unspecified severe protein-calorie malnutrition: Secondary | ICD-10-CM | POA: Diagnosis not present

## 2016-09-30 DIAGNOSIS — I69320 Aphasia following cerebral infarction: Secondary | ICD-10-CM | POA: Diagnosis not present

## 2016-09-30 DIAGNOSIS — N132 Hydronephrosis with renal and ureteral calculous obstruction: Secondary | ICD-10-CM | POA: Diagnosis not present

## 2016-09-30 DIAGNOSIS — M1990 Unspecified osteoarthritis, unspecified site: Secondary | ICD-10-CM | POA: Diagnosis not present

## 2016-09-30 DIAGNOSIS — I130 Hypertensive heart and chronic kidney disease with heart failure and stage 1 through stage 4 chronic kidney disease, or unspecified chronic kidney disease: Secondary | ICD-10-CM | POA: Diagnosis not present

## 2016-09-30 DIAGNOSIS — Z86718 Personal history of other venous thrombosis and embolism: Secondary | ICD-10-CM | POA: Diagnosis not present

## 2016-09-30 DIAGNOSIS — I69391 Dysphagia following cerebral infarction: Secondary | ICD-10-CM | POA: Diagnosis not present

## 2016-09-30 DIAGNOSIS — D631 Anemia in chronic kidney disease: Secondary | ICD-10-CM | POA: Diagnosis not present

## 2016-09-30 DIAGNOSIS — I69351 Hemiplegia and hemiparesis following cerebral infarction affecting right dominant side: Secondary | ICD-10-CM | POA: Diagnosis not present

## 2016-09-30 DIAGNOSIS — J439 Emphysema, unspecified: Secondary | ICD-10-CM | POA: Diagnosis not present

## 2016-09-30 DIAGNOSIS — N184 Chronic kidney disease, stage 4 (severe): Secondary | ICD-10-CM | POA: Diagnosis not present

## 2016-09-30 DIAGNOSIS — N390 Urinary tract infection, site not specified: Secondary | ICD-10-CM | POA: Diagnosis not present

## 2016-09-30 DIAGNOSIS — Z792 Long term (current) use of antibiotics: Secondary | ICD-10-CM | POA: Diagnosis not present

## 2016-09-30 DIAGNOSIS — Z9181 History of falling: Secondary | ICD-10-CM | POA: Diagnosis not present

## 2016-09-30 DIAGNOSIS — I2510 Atherosclerotic heart disease of native coronary artery without angina pectoris: Secondary | ICD-10-CM | POA: Diagnosis not present

## 2016-09-30 DIAGNOSIS — I5032 Chronic diastolic (congestive) heart failure: Secondary | ICD-10-CM | POA: Diagnosis not present

## 2016-09-30 DIAGNOSIS — Z466 Encounter for fitting and adjustment of urinary device: Secondary | ICD-10-CM | POA: Diagnosis not present

## 2016-09-30 DIAGNOSIS — R1310 Dysphagia, unspecified: Secondary | ICD-10-CM | POA: Diagnosis not present

## 2016-09-30 DIAGNOSIS — E1122 Type 2 diabetes mellitus with diabetic chronic kidney disease: Secondary | ICD-10-CM | POA: Diagnosis not present

## 2016-09-30 DIAGNOSIS — E785 Hyperlipidemia, unspecified: Secondary | ICD-10-CM | POA: Diagnosis not present

## 2016-09-30 DIAGNOSIS — G4733 Obstructive sleep apnea (adult) (pediatric): Secondary | ICD-10-CM | POA: Diagnosis not present

## 2016-09-30 DIAGNOSIS — E43 Unspecified severe protein-calorie malnutrition: Secondary | ICD-10-CM | POA: Diagnosis not present

## 2016-09-30 DIAGNOSIS — L89153 Pressure ulcer of sacral region, stage 3: Secondary | ICD-10-CM | POA: Diagnosis not present

## 2016-09-30 DIAGNOSIS — S91309A Unspecified open wound, unspecified foot, initial encounter: Secondary | ICD-10-CM | POA: Diagnosis not present

## 2016-09-30 DIAGNOSIS — L89322 Pressure ulcer of left buttock, stage 2: Secondary | ICD-10-CM | POA: Diagnosis not present

## 2016-10-02 DIAGNOSIS — N184 Chronic kidney disease, stage 4 (severe): Secondary | ICD-10-CM | POA: Diagnosis not present

## 2016-10-02 DIAGNOSIS — L89322 Pressure ulcer of left buttock, stage 2: Secondary | ICD-10-CM | POA: Diagnosis not present

## 2016-10-02 DIAGNOSIS — G4733 Obstructive sleep apnea (adult) (pediatric): Secondary | ICD-10-CM | POA: Diagnosis not present

## 2016-10-02 DIAGNOSIS — Z86718 Personal history of other venous thrombosis and embolism: Secondary | ICD-10-CM | POA: Diagnosis not present

## 2016-10-02 DIAGNOSIS — D631 Anemia in chronic kidney disease: Secondary | ICD-10-CM | POA: Diagnosis not present

## 2016-10-02 DIAGNOSIS — Z466 Encounter for fitting and adjustment of urinary device: Secondary | ICD-10-CM | POA: Diagnosis not present

## 2016-10-02 DIAGNOSIS — I5032 Chronic diastolic (congestive) heart failure: Secondary | ICD-10-CM | POA: Diagnosis not present

## 2016-10-02 DIAGNOSIS — E785 Hyperlipidemia, unspecified: Secondary | ICD-10-CM | POA: Diagnosis not present

## 2016-10-02 DIAGNOSIS — N390 Urinary tract infection, site not specified: Secondary | ICD-10-CM | POA: Diagnosis not present

## 2016-10-02 DIAGNOSIS — I2510 Atherosclerotic heart disease of native coronary artery without angina pectoris: Secondary | ICD-10-CM | POA: Diagnosis not present

## 2016-10-02 DIAGNOSIS — Z792 Long term (current) use of antibiotics: Secondary | ICD-10-CM | POA: Diagnosis not present

## 2016-10-02 DIAGNOSIS — E43 Unspecified severe protein-calorie malnutrition: Secondary | ICD-10-CM | POA: Diagnosis not present

## 2016-10-02 DIAGNOSIS — N132 Hydronephrosis with renal and ureteral calculous obstruction: Secondary | ICD-10-CM | POA: Diagnosis not present

## 2016-10-02 DIAGNOSIS — Z9181 History of falling: Secondary | ICD-10-CM | POA: Diagnosis not present

## 2016-10-02 DIAGNOSIS — M1990 Unspecified osteoarthritis, unspecified site: Secondary | ICD-10-CM | POA: Diagnosis not present

## 2016-10-02 DIAGNOSIS — E1122 Type 2 diabetes mellitus with diabetic chronic kidney disease: Secondary | ICD-10-CM | POA: Diagnosis not present

## 2016-10-02 DIAGNOSIS — J439 Emphysema, unspecified: Secondary | ICD-10-CM | POA: Diagnosis not present

## 2016-10-02 DIAGNOSIS — R1310 Dysphagia, unspecified: Secondary | ICD-10-CM | POA: Diagnosis not present

## 2016-10-02 DIAGNOSIS — I69391 Dysphagia following cerebral infarction: Secondary | ICD-10-CM | POA: Diagnosis not present

## 2016-10-02 DIAGNOSIS — I69320 Aphasia following cerebral infarction: Secondary | ICD-10-CM | POA: Diagnosis not present

## 2016-10-02 DIAGNOSIS — I130 Hypertensive heart and chronic kidney disease with heart failure and stage 1 through stage 4 chronic kidney disease, or unspecified chronic kidney disease: Secondary | ICD-10-CM | POA: Diagnosis not present

## 2016-10-02 DIAGNOSIS — I69351 Hemiplegia and hemiparesis following cerebral infarction affecting right dominant side: Secondary | ICD-10-CM | POA: Diagnosis not present

## 2016-10-05 ENCOUNTER — Other Ambulatory Visit: Payer: Self-pay | Admitting: Internal Medicine

## 2016-10-05 NOTE — Telephone Encounter (Signed)
oxyCODONE-acetaminophen (ROXICET) 5-325 MG tablet, Refill request.

## 2016-10-05 NOTE — Telephone Encounter (Signed)
Call to patient-no answer, message left on recorder.Michelle Hidden Cassady9/10/20183:04 PM

## 2016-10-05 NOTE — Telephone Encounter (Signed)
Pls pt regarding medicine refills

## 2016-10-06 DIAGNOSIS — I69351 Hemiplegia and hemiparesis following cerebral infarction affecting right dominant side: Secondary | ICD-10-CM | POA: Diagnosis not present

## 2016-10-06 DIAGNOSIS — E1122 Type 2 diabetes mellitus with diabetic chronic kidney disease: Secondary | ICD-10-CM | POA: Diagnosis not present

## 2016-10-06 DIAGNOSIS — M1990 Unspecified osteoarthritis, unspecified site: Secondary | ICD-10-CM | POA: Diagnosis not present

## 2016-10-06 DIAGNOSIS — I69391 Dysphagia following cerebral infarction: Secondary | ICD-10-CM | POA: Diagnosis not present

## 2016-10-06 DIAGNOSIS — Z466 Encounter for fitting and adjustment of urinary device: Secondary | ICD-10-CM | POA: Diagnosis not present

## 2016-10-06 DIAGNOSIS — D631 Anemia in chronic kidney disease: Secondary | ICD-10-CM | POA: Diagnosis not present

## 2016-10-06 DIAGNOSIS — Z9181 History of falling: Secondary | ICD-10-CM | POA: Diagnosis not present

## 2016-10-06 DIAGNOSIS — I5032 Chronic diastolic (congestive) heart failure: Secondary | ICD-10-CM | POA: Diagnosis not present

## 2016-10-06 DIAGNOSIS — N132 Hydronephrosis with renal and ureteral calculous obstruction: Secondary | ICD-10-CM | POA: Diagnosis not present

## 2016-10-06 DIAGNOSIS — G4733 Obstructive sleep apnea (adult) (pediatric): Secondary | ICD-10-CM | POA: Diagnosis not present

## 2016-10-06 DIAGNOSIS — I69320 Aphasia following cerebral infarction: Secondary | ICD-10-CM | POA: Diagnosis not present

## 2016-10-06 DIAGNOSIS — N184 Chronic kidney disease, stage 4 (severe): Secondary | ICD-10-CM | POA: Diagnosis not present

## 2016-10-06 DIAGNOSIS — E43 Unspecified severe protein-calorie malnutrition: Secondary | ICD-10-CM | POA: Diagnosis not present

## 2016-10-06 DIAGNOSIS — E785 Hyperlipidemia, unspecified: Secondary | ICD-10-CM | POA: Diagnosis not present

## 2016-10-06 DIAGNOSIS — R1310 Dysphagia, unspecified: Secondary | ICD-10-CM | POA: Diagnosis not present

## 2016-10-06 DIAGNOSIS — Z792 Long term (current) use of antibiotics: Secondary | ICD-10-CM | POA: Diagnosis not present

## 2016-10-06 DIAGNOSIS — Z86718 Personal history of other venous thrombosis and embolism: Secondary | ICD-10-CM | POA: Diagnosis not present

## 2016-10-06 DIAGNOSIS — J439 Emphysema, unspecified: Secondary | ICD-10-CM | POA: Diagnosis not present

## 2016-10-06 DIAGNOSIS — I130 Hypertensive heart and chronic kidney disease with heart failure and stage 1 through stage 4 chronic kidney disease, or unspecified chronic kidney disease: Secondary | ICD-10-CM | POA: Diagnosis not present

## 2016-10-06 DIAGNOSIS — I2510 Atherosclerotic heart disease of native coronary artery without angina pectoris: Secondary | ICD-10-CM | POA: Diagnosis not present

## 2016-10-06 DIAGNOSIS — N390 Urinary tract infection, site not specified: Secondary | ICD-10-CM | POA: Diagnosis not present

## 2016-10-06 DIAGNOSIS — L89322 Pressure ulcer of left buttock, stage 2: Secondary | ICD-10-CM | POA: Diagnosis not present

## 2016-10-06 NOTE — Telephone Encounter (Signed)
Rx last written 7/24.  Appt scheduled 8/14 with Dr [name]. UDS - 01/28/16.

## 2016-10-08 ENCOUNTER — Other Ambulatory Visit: Payer: Self-pay | Admitting: Internal Medicine

## 2016-10-08 DIAGNOSIS — N132 Hydronephrosis with renal and ureteral calculous obstruction: Secondary | ICD-10-CM | POA: Diagnosis not present

## 2016-10-08 DIAGNOSIS — E785 Hyperlipidemia, unspecified: Secondary | ICD-10-CM | POA: Diagnosis not present

## 2016-10-08 DIAGNOSIS — Z466 Encounter for fitting and adjustment of urinary device: Secondary | ICD-10-CM | POA: Diagnosis not present

## 2016-10-08 DIAGNOSIS — I2510 Atherosclerotic heart disease of native coronary artery without angina pectoris: Secondary | ICD-10-CM | POA: Diagnosis not present

## 2016-10-08 DIAGNOSIS — M1990 Unspecified osteoarthritis, unspecified site: Secondary | ICD-10-CM | POA: Diagnosis not present

## 2016-10-08 DIAGNOSIS — N184 Chronic kidney disease, stage 4 (severe): Secondary | ICD-10-CM | POA: Diagnosis not present

## 2016-10-08 DIAGNOSIS — I5032 Chronic diastolic (congestive) heart failure: Secondary | ICD-10-CM | POA: Diagnosis not present

## 2016-10-08 DIAGNOSIS — L89322 Pressure ulcer of left buttock, stage 2: Secondary | ICD-10-CM | POA: Diagnosis not present

## 2016-10-08 DIAGNOSIS — Z79891 Long term (current) use of opiate analgesic: Secondary | ICD-10-CM

## 2016-10-08 DIAGNOSIS — D631 Anemia in chronic kidney disease: Secondary | ICD-10-CM | POA: Diagnosis not present

## 2016-10-08 DIAGNOSIS — Z9181 History of falling: Secondary | ICD-10-CM | POA: Diagnosis not present

## 2016-10-08 DIAGNOSIS — E43 Unspecified severe protein-calorie malnutrition: Secondary | ICD-10-CM | POA: Diagnosis not present

## 2016-10-08 DIAGNOSIS — I69320 Aphasia following cerebral infarction: Secondary | ICD-10-CM | POA: Diagnosis not present

## 2016-10-08 DIAGNOSIS — I69351 Hemiplegia and hemiparesis following cerebral infarction affecting right dominant side: Secondary | ICD-10-CM | POA: Diagnosis not present

## 2016-10-08 DIAGNOSIS — Z86718 Personal history of other venous thrombosis and embolism: Secondary | ICD-10-CM | POA: Diagnosis not present

## 2016-10-08 DIAGNOSIS — R1310 Dysphagia, unspecified: Secondary | ICD-10-CM | POA: Diagnosis not present

## 2016-10-08 DIAGNOSIS — G4733 Obstructive sleep apnea (adult) (pediatric): Secondary | ICD-10-CM | POA: Diagnosis not present

## 2016-10-08 DIAGNOSIS — Z792 Long term (current) use of antibiotics: Secondary | ICD-10-CM | POA: Diagnosis not present

## 2016-10-08 DIAGNOSIS — E1122 Type 2 diabetes mellitus with diabetic chronic kidney disease: Secondary | ICD-10-CM | POA: Diagnosis not present

## 2016-10-08 DIAGNOSIS — I130 Hypertensive heart and chronic kidney disease with heart failure and stage 1 through stage 4 chronic kidney disease, or unspecified chronic kidney disease: Secondary | ICD-10-CM | POA: Diagnosis not present

## 2016-10-08 DIAGNOSIS — N390 Urinary tract infection, site not specified: Secondary | ICD-10-CM | POA: Diagnosis not present

## 2016-10-08 DIAGNOSIS — J439 Emphysema, unspecified: Secondary | ICD-10-CM | POA: Diagnosis not present

## 2016-10-08 DIAGNOSIS — I69391 Dysphagia following cerebral infarction: Secondary | ICD-10-CM | POA: Diagnosis not present

## 2016-10-08 MED ORDER — OXYCODONE HCL 20 MG PO TABS: 20.0000 mg | ORAL_TABLET | Freq: Three times a day (TID) | ORAL | 0 refills | Status: DC | PRN

## 2016-10-08 NOTE — Telephone Encounter (Signed)
Will Give refill X 1.  Reviewed [place] database, and she received an Rx from another provider in July.  Will need to be reviewed by PCP to see if appropriate.  Last Utox reviewed.  Given upcoming weather, will refill X 1 and allow PCP to make further decisions re: refills.

## 2016-10-08 NOTE — Addendum Note (Signed)
Addended by: Gilles Chiquito B on: 10/08/2016 05:05 PM   Modules accepted: Orders

## 2016-10-08 NOTE — Telephone Encounter (Signed)
NEEDS REFILL FOR PAIN MEDS

## 2016-10-09 ENCOUNTER — Encounter: Payer: Self-pay | Admitting: Internal Medicine

## 2016-10-14 ENCOUNTER — Telehealth: Payer: Self-pay | Admitting: *Deleted

## 2016-10-14 DIAGNOSIS — D631 Anemia in chronic kidney disease: Secondary | ICD-10-CM | POA: Diagnosis not present

## 2016-10-14 DIAGNOSIS — I5032 Chronic diastolic (congestive) heart failure: Secondary | ICD-10-CM | POA: Diagnosis not present

## 2016-10-14 DIAGNOSIS — L89322 Pressure ulcer of left buttock, stage 2: Secondary | ICD-10-CM | POA: Diagnosis not present

## 2016-10-14 DIAGNOSIS — M1990 Unspecified osteoarthritis, unspecified site: Secondary | ICD-10-CM | POA: Diagnosis not present

## 2016-10-14 DIAGNOSIS — R1310 Dysphagia, unspecified: Secondary | ICD-10-CM | POA: Diagnosis not present

## 2016-10-14 DIAGNOSIS — Z86718 Personal history of other venous thrombosis and embolism: Secondary | ICD-10-CM | POA: Diagnosis not present

## 2016-10-14 DIAGNOSIS — N390 Urinary tract infection, site not specified: Secondary | ICD-10-CM | POA: Diagnosis not present

## 2016-10-14 DIAGNOSIS — I69391 Dysphagia following cerebral infarction: Secondary | ICD-10-CM | POA: Diagnosis not present

## 2016-10-14 DIAGNOSIS — N184 Chronic kidney disease, stage 4 (severe): Secondary | ICD-10-CM | POA: Diagnosis not present

## 2016-10-14 DIAGNOSIS — I130 Hypertensive heart and chronic kidney disease with heart failure and stage 1 through stage 4 chronic kidney disease, or unspecified chronic kidney disease: Secondary | ICD-10-CM | POA: Diagnosis not present

## 2016-10-14 DIAGNOSIS — Z466 Encounter for fitting and adjustment of urinary device: Secondary | ICD-10-CM | POA: Diagnosis not present

## 2016-10-14 DIAGNOSIS — I69351 Hemiplegia and hemiparesis following cerebral infarction affecting right dominant side: Secondary | ICD-10-CM | POA: Diagnosis not present

## 2016-10-14 DIAGNOSIS — G4733 Obstructive sleep apnea (adult) (pediatric): Secondary | ICD-10-CM | POA: Diagnosis not present

## 2016-10-14 DIAGNOSIS — J439 Emphysema, unspecified: Secondary | ICD-10-CM | POA: Diagnosis not present

## 2016-10-14 DIAGNOSIS — Z9181 History of falling: Secondary | ICD-10-CM | POA: Diagnosis not present

## 2016-10-14 DIAGNOSIS — E43 Unspecified severe protein-calorie malnutrition: Secondary | ICD-10-CM | POA: Diagnosis not present

## 2016-10-14 DIAGNOSIS — E785 Hyperlipidemia, unspecified: Secondary | ICD-10-CM | POA: Diagnosis not present

## 2016-10-14 DIAGNOSIS — Z792 Long term (current) use of antibiotics: Secondary | ICD-10-CM | POA: Diagnosis not present

## 2016-10-14 DIAGNOSIS — I2510 Atherosclerotic heart disease of native coronary artery without angina pectoris: Secondary | ICD-10-CM | POA: Diagnosis not present

## 2016-10-14 DIAGNOSIS — E1122 Type 2 diabetes mellitus with diabetic chronic kidney disease: Secondary | ICD-10-CM | POA: Diagnosis not present

## 2016-10-14 DIAGNOSIS — I5040 Unspecified combined systolic (congestive) and diastolic (congestive) heart failure: Secondary | ICD-10-CM | POA: Diagnosis not present

## 2016-10-14 DIAGNOSIS — N132 Hydronephrosis with renal and ureteral calculous obstruction: Secondary | ICD-10-CM | POA: Diagnosis not present

## 2016-10-14 DIAGNOSIS — I69320 Aphasia following cerebral infarction: Secondary | ICD-10-CM | POA: Diagnosis not present

## 2016-10-14 NOTE — Telephone Encounter (Signed)
[place] [facility] nurse [phone]) called reporting patient has a lot of sediments in her urine, Flis colored & has yeast infection. Requesting order for UA/C&S? Appt was cancelled 9.14.18. pls advise!

## 2016-10-15 ENCOUNTER — Other Ambulatory Visit: Payer: Self-pay | Admitting: Internal Medicine

## 2016-10-15 DIAGNOSIS — N390 Urinary tract infection, site not specified: Secondary | ICD-10-CM

## 2016-10-15 NOTE — Telephone Encounter (Signed)
We can do UA.I will place an order.

## 2016-10-15 NOTE — Telephone Encounter (Signed)
Call from [place], [place] given for UA per Dr [name].

## 2016-10-16 DIAGNOSIS — I130 Hypertensive heart and chronic kidney disease with heart failure and stage 1 through stage 4 chronic kidney disease, or unspecified chronic kidney disease: Secondary | ICD-10-CM | POA: Diagnosis not present

## 2016-10-16 DIAGNOSIS — I69391 Dysphagia following cerebral infarction: Secondary | ICD-10-CM | POA: Diagnosis not present

## 2016-10-16 DIAGNOSIS — J439 Emphysema, unspecified: Secondary | ICD-10-CM | POA: Diagnosis not present

## 2016-10-16 DIAGNOSIS — I69320 Aphasia following cerebral infarction: Secondary | ICD-10-CM | POA: Diagnosis not present

## 2016-10-16 DIAGNOSIS — N184 Chronic kidney disease, stage 4 (severe): Secondary | ICD-10-CM | POA: Diagnosis not present

## 2016-10-16 DIAGNOSIS — L89322 Pressure ulcer of left buttock, stage 2: Secondary | ICD-10-CM | POA: Diagnosis not present

## 2016-10-16 DIAGNOSIS — E1122 Type 2 diabetes mellitus with diabetic chronic kidney disease: Secondary | ICD-10-CM | POA: Diagnosis not present

## 2016-10-16 DIAGNOSIS — I69351 Hemiplegia and hemiparesis following cerebral infarction affecting right dominant side: Secondary | ICD-10-CM | POA: Diagnosis not present

## 2016-10-16 DIAGNOSIS — N390 Urinary tract infection, site not specified: Secondary | ICD-10-CM | POA: Diagnosis not present

## 2016-10-16 DIAGNOSIS — I5032 Chronic diastolic (congestive) heart failure: Secondary | ICD-10-CM | POA: Diagnosis not present

## 2016-10-16 DIAGNOSIS — Z466 Encounter for fitting and adjustment of urinary device: Secondary | ICD-10-CM | POA: Diagnosis not present

## 2016-10-16 DIAGNOSIS — D631 Anemia in chronic kidney disease: Secondary | ICD-10-CM | POA: Diagnosis not present

## 2016-10-16 DIAGNOSIS — N132 Hydronephrosis with renal and ureteral calculous obstruction: Secondary | ICD-10-CM | POA: Diagnosis not present

## 2016-10-19 DIAGNOSIS — I69351 Hemiplegia and hemiparesis following cerebral infarction affecting right dominant side: Secondary | ICD-10-CM | POA: Diagnosis not present

## 2016-10-19 DIAGNOSIS — Z466 Encounter for fitting and adjustment of urinary device: Secondary | ICD-10-CM | POA: Diagnosis not present

## 2016-10-19 DIAGNOSIS — I69320 Aphasia following cerebral infarction: Secondary | ICD-10-CM | POA: Diagnosis not present

## 2016-10-19 DIAGNOSIS — J439 Emphysema, unspecified: Secondary | ICD-10-CM | POA: Diagnosis not present

## 2016-10-19 DIAGNOSIS — I69391 Dysphagia following cerebral infarction: Secondary | ICD-10-CM | POA: Diagnosis not present

## 2016-10-19 DIAGNOSIS — E1122 Type 2 diabetes mellitus with diabetic chronic kidney disease: Secondary | ICD-10-CM | POA: Diagnosis not present

## 2016-10-19 DIAGNOSIS — L89322 Pressure ulcer of left buttock, stage 2: Secondary | ICD-10-CM | POA: Diagnosis not present

## 2016-10-19 DIAGNOSIS — D631 Anemia in chronic kidney disease: Secondary | ICD-10-CM | POA: Diagnosis not present

## 2016-10-19 DIAGNOSIS — N184 Chronic kidney disease, stage 4 (severe): Secondary | ICD-10-CM | POA: Diagnosis not present

## 2016-10-19 DIAGNOSIS — I5032 Chronic diastolic (congestive) heart failure: Secondary | ICD-10-CM | POA: Diagnosis not present

## 2016-10-19 DIAGNOSIS — N390 Urinary tract infection, site not specified: Secondary | ICD-10-CM | POA: Diagnosis not present

## 2016-10-19 DIAGNOSIS — I130 Hypertensive heart and chronic kidney disease with heart failure and stage 1 through stage 4 chronic kidney disease, or unspecified chronic kidney disease: Secondary | ICD-10-CM | POA: Diagnosis not present

## 2016-10-19 DIAGNOSIS — N132 Hydronephrosis with renal and ureteral calculous obstruction: Secondary | ICD-10-CM | POA: Diagnosis not present

## 2016-10-21 DIAGNOSIS — I130 Hypertensive heart and chronic kidney disease with heart failure and stage 1 through stage 4 chronic kidney disease, or unspecified chronic kidney disease: Secondary | ICD-10-CM | POA: Diagnosis not present

## 2016-10-21 DIAGNOSIS — D631 Anemia in chronic kidney disease: Secondary | ICD-10-CM | POA: Diagnosis not present

## 2016-10-21 DIAGNOSIS — Z466 Encounter for fitting and adjustment of urinary device: Secondary | ICD-10-CM | POA: Diagnosis not present

## 2016-10-21 DIAGNOSIS — N184 Chronic kidney disease, stage 4 (severe): Secondary | ICD-10-CM | POA: Diagnosis not present

## 2016-10-21 DIAGNOSIS — I69320 Aphasia following cerebral infarction: Secondary | ICD-10-CM | POA: Diagnosis not present

## 2016-10-21 DIAGNOSIS — N202 Calculus of kidney with calculus of ureter: Secondary | ICD-10-CM | POA: Diagnosis not present

## 2016-10-21 DIAGNOSIS — I69351 Hemiplegia and hemiparesis following cerebral infarction affecting right dominant side: Secondary | ICD-10-CM | POA: Diagnosis not present

## 2016-10-21 DIAGNOSIS — N390 Urinary tract infection, site not specified: Secondary | ICD-10-CM | POA: Diagnosis not present

## 2016-10-21 DIAGNOSIS — J439 Emphysema, unspecified: Secondary | ICD-10-CM | POA: Diagnosis not present

## 2016-10-21 DIAGNOSIS — N132 Hydronephrosis with renal and ureteral calculous obstruction: Secondary | ICD-10-CM | POA: Diagnosis not present

## 2016-10-21 DIAGNOSIS — I69391 Dysphagia following cerebral infarction: Secondary | ICD-10-CM | POA: Diagnosis not present

## 2016-10-21 DIAGNOSIS — E1122 Type 2 diabetes mellitus with diabetic chronic kidney disease: Secondary | ICD-10-CM | POA: Diagnosis not present

## 2016-10-21 DIAGNOSIS — L89322 Pressure ulcer of left buttock, stage 2: Secondary | ICD-10-CM | POA: Diagnosis not present

## 2016-10-21 DIAGNOSIS — I5032 Chronic diastolic (congestive) heart failure: Secondary | ICD-10-CM | POA: Diagnosis not present

## 2016-10-22 ENCOUNTER — Encounter (HOSPITAL_COMMUNITY): Payer: Self-pay | Admitting: Emergency Medicine

## 2016-10-22 ENCOUNTER — Emergency Department (HOSPITAL_COMMUNITY)
Admission: EM | Admit: 2016-10-22 | Discharge: 2016-10-23 | Disposition: A | Discharge status: Home / Self Care | Payer: Medicare Other | Attending: Emergency Medicine | Admitting: Emergency Medicine

## 2016-10-22 ENCOUNTER — Telehealth: Payer: Self-pay | Admitting: Internal Medicine

## 2016-10-22 DIAGNOSIS — Z955 Presence of coronary angioplasty implant and graft: Secondary | ICD-10-CM | POA: Diagnosis not present

## 2016-10-22 DIAGNOSIS — T83198A Other mechanical complication of other urinary devices and implants, initial encounter: Secondary | ICD-10-CM | POA: Diagnosis not present

## 2016-10-22 DIAGNOSIS — L89322 Pressure ulcer of left buttock, stage 2: Secondary | ICD-10-CM | POA: Diagnosis not present

## 2016-10-22 DIAGNOSIS — E1122 Type 2 diabetes mellitus with diabetic chronic kidney disease: Secondary | ICD-10-CM | POA: Insufficient documentation

## 2016-10-22 DIAGNOSIS — I130 Hypertensive heart and chronic kidney disease with heart failure and stage 1 through stage 4 chronic kidney disease, or unspecified chronic kidney disease: Secondary | ICD-10-CM | POA: Diagnosis not present

## 2016-10-22 DIAGNOSIS — R102 Pelvic and perineal pain: Secondary | ICD-10-CM | POA: Diagnosis not present

## 2016-10-22 DIAGNOSIS — N184 Chronic kidney disease, stage 4 (severe): Secondary | ICD-10-CM | POA: Diagnosis not present

## 2016-10-22 DIAGNOSIS — N390 Urinary tract infection, site not specified: Secondary | ICD-10-CM | POA: Diagnosis not present

## 2016-10-22 DIAGNOSIS — Y829 Unspecified medical devices associated with adverse incidents: Secondary | ICD-10-CM | POA: Diagnosis not present

## 2016-10-22 DIAGNOSIS — I5032 Chronic diastolic (congestive) heart failure: Secondary | ICD-10-CM | POA: Diagnosis not present

## 2016-10-22 DIAGNOSIS — I2510 Atherosclerotic heart disease of native coronary artery without angina pectoris: Secondary | ICD-10-CM | POA: Insufficient documentation

## 2016-10-22 DIAGNOSIS — D631 Anemia in chronic kidney disease: Secondary | ICD-10-CM | POA: Diagnosis not present

## 2016-10-22 DIAGNOSIS — Z466 Encounter for fitting and adjustment of urinary device: Secondary | ICD-10-CM | POA: Diagnosis not present

## 2016-10-22 DIAGNOSIS — I69351 Hemiplegia and hemiparesis following cerebral infarction affecting right dominant side: Secondary | ICD-10-CM | POA: Diagnosis not present

## 2016-10-22 DIAGNOSIS — J449 Chronic obstructive pulmonary disease, unspecified: Secondary | ICD-10-CM | POA: Diagnosis not present

## 2016-10-22 DIAGNOSIS — Z794 Long term (current) use of insulin: Secondary | ICD-10-CM | POA: Diagnosis not present

## 2016-10-22 DIAGNOSIS — T839XXA Unspecified complication of genitourinary prosthetic device, implant and graft, initial encounter: Secondary | ICD-10-CM | POA: Diagnosis present

## 2016-10-22 DIAGNOSIS — J439 Emphysema, unspecified: Secondary | ICD-10-CM | POA: Diagnosis not present

## 2016-10-22 DIAGNOSIS — N132 Hydronephrosis with renal and ureteral calculous obstruction: Secondary | ICD-10-CM | POA: Diagnosis not present

## 2016-10-22 DIAGNOSIS — I69391 Dysphagia following cerebral infarction: Secondary | ICD-10-CM | POA: Diagnosis not present

## 2016-10-22 DIAGNOSIS — I69320 Aphasia following cerebral infarction: Secondary | ICD-10-CM | POA: Diagnosis not present

## 2016-10-22 NOTE — ED Triage Notes (Signed)
BIB EMS from home, called out for Foley Catheter Issues. Pt had Foley changed today at 1500, pt has had 10/10 pain and no urinary output. EDP at bedside, order to replace catheter.

## 2016-10-22 NOTE — ED Provider Notes (Signed)
[facility] DEPT Provider Note   CSN: [id number] Arrival date & time: 10/22/16  2256     History   Chief Complaint Chief Complaint  Patient presents with  . Foley Catheter Issue    HPI Michelle Holmes is a 66 y.o. female.  Patient with chronic indwelling Foley catheter presents to the ER with complaints of Foley catheter pain. Patient reports that she had her catheter replaced today, since the replacement she has been having pain in the area of her pelvis and bladder area, as well as having no urine output. She thinks the Foley is in the wrong place. She did not have hematuria cloudy urine, flank pain, fever or any urinary symptoms prior to change of catheter.      Past Medical History:  Diagnosis Date  . Anemia   . CHF (congestive heart failure) ([place])   . Chronic back pain   . COPD (chronic obstructive pulmonary disease) ([place])   . Coronary artery disease   . Diabetes mellitus   . DJD (degenerative joint disease)   . Emphysema   . Hypercholesteremia   . Hypertension   . Myocardial infarction ([place])   . Obesity   . Obesity hypoventilation syndrome ([place])   . Renal insufficiency   . Scoliosis   . Sleep apnea   . Stroke ([place])   . TIA (transient ischemic attack)     Patient Active Problem List   Diagnosis Date Noted  . Stroke (cerebrum) ([place]) 08/04/2016  . CKD (chronic kidney disease), stage IV ([place]) 08/04/2016  . Hypertensive urgency 08/04/2016  . Kidney stones 08/04/2016  . Long-term current use of opiate analgesic 03/05/2016  . Essential hypertension 03/05/2016  . Dvt femoral (deep venous thrombosis) ([place]) 02/17/2016  . Chronic diastolic (congestive) heart failure ([place]) 01/28/2016  . Dehydration 01/28/2016  . Protein-calorie malnutrition, severe 01/28/2016  . Emesis   . Goals of care, counseling/discussion   . Palliative care by specialist   . Anemia in other chronic diseases classified elsewhere   . Acute urinary retention   . Pressure injury of skin  11/24/2015  . Acute metabolic encephalopathy   . Chronic kidney disease (CKD), stage IV (severe) ([place]) 07/06/2015  . Acute CVA (cerebrovascular accident) ([place])   . Sinus bradycardia 07/04/2015  . Complicated UTI (urinary tract infection) 07/04/2015  . Parietal lobe infarction ([place]) 07/04/2015  . Spastic hemiplegia affecting nondominant side ([place]) 07/01/2015  . Obesity, morbid ([place]) 07/01/2015  . Thrombocytopenia ([place])   . Acute ischemic VBA thalamic stroke ([place]) 01/18/2015  . Acute kidney injury superimposed on CKD ([place])   . Dysarthria   . Lethargy   . Labile blood pressure   . HLD (hyperlipidemia)   . Chronic obstructive pulmonary disease ([place])   . Hemiparesis, aphasia, and dysphagia as late effect of cerebrovascular accident (CVA) ([place])   . Acute ischemic stroke ([place])   . Right hemiplegia ([place])   . Abdominal pain 06/20/2014  . OSA on CPAP 12/08/2010  . Morbid obesity ([place]) 12/08/2010  . CAD (coronary artery disease) 12/08/2010  . Uncontrolled type 2 DM with hyperosmolar nonketotic hyperglycemia ([place]) 12/01/2010    Past Surgical History:  Procedure Laterality Date  . bil uterscospy     08/18/16 Dr. Jeffie Pollock  . COLON SURGERY    . CORONARY ANGIOPLASTY WITH STENT PLACEMENT    . CYSTOSCOPY WITH STENT PLACEMENT Bilateral 08/04/2016   Procedure: CYSTOSCOPY WITH STENT PLACEMENT bilateral bilateral retrograde fecal disimpaction;  Surgeon: Ardis Hughs, MD;  Location: [facility] ORS;  Service: Urology;  Laterality: Bilateral;  . CYSTOSCOPY/URETEROSCOPY/HOLMIUM LASER/STENT PLACEMENT Bilateral 08/18/2016   Procedure: BILATERAL URETEROSCOPY WITH HOLMIUM LASER AND STENTS;  Surgeon: Irine Seal, MD;  Location: [facility] ORS;  Service: Urology;  Laterality: Bilateral;    OB History    No data available       Home Medications    Prior to Admission medications   Medication Sig Start Date End Date Taking? Authorizing Provider  apixaban (ELIQUIS) 2.5 MG TABS tablet Take 1 tablet (2.5 mg total) by  mouth 2 (two) times daily. 08/22/16 10/22/16 Yes Filippou, Braxton Feathers, MD  Diapers & Supplies MISC Please provide patient with adult diapers and pads 06/03/16  Yes Ophelia Shoulder, MD  Oxycodone HCl 20 MG TABS Take 1 tablet (20 mg total) by mouth 3 (three) times daily as needed. Patient taking differently: Take 20 mg by mouth 3 (three) times daily as needed (pain).  10/08/16  Yes Sid Falcon, MD  oxyCODONE-acetaminophen (ROXICET) 5-325 MG tablet Take 1 tablet by mouth every 6 (six) hours as needed. Patient taking differently: Take 1 tablet by mouth every 6 (six) hours as needed for moderate pain.  08/18/16 08/18/17 Yes Filippou, Braxton Feathers, MD  insulin aspart (NOVOLOG) 100 UNIT/ML injection Inject 10 Units into the skin as needed for high blood sugar. 09/22/16   Lorella Nimrod, MD  LANTUS SOLOSTAR 100 UNIT/ML Solostar Pen Inject 10 Units into the skin daily at 10 pm. 09/24/16   Lorella Nimrod, MD    Family History Family History  Problem Relation Age of Onset  . Diabetes Mother   . Stroke Maternal Aunt     Social History Social History  Substance Use Topics  . Smoking status: Never Smoker  . Smokeless tobacco: Never Used  . Alcohol use No     Allergies   Penicillins   Review of Systems Review of Systems  Genitourinary: Positive for decreased urine volume and pelvic pain.  All other systems reviewed and are negative.    Physical Exam Updated Vital Signs BP 120/84   Pulse 89   Temp 99 F (37.2 C)   Ht 5\' 4"  (1.626 m)   Wt 72.6 kg (160 lb)   SpO2 100%   BMI 27.46 kg/m   Physical Exam  Constitutional: She is oriented to person, place, and time. She appears well-developed and well-nourished. No distress.  HENT:  Head: Normocephalic and atraumatic.  Right Ear: Hearing normal.  Left Ear: Hearing normal.  Nose: Nose normal.  Mouth/Throat: Oropharynx is clear and moist and mucous membranes are normal.  Eyes: Pupils are equal, round, and reactive to light. Conjunctivae and EOM are  normal.  Neck: Normal range of motion. Neck supple.  Cardiovascular: Regular rhythm, S1 normal and S2 normal.  Exam reveals no gallop and no friction rub.   No murmur heard. Pulmonary/Chest: Effort normal and breath sounds normal. No respiratory distress. She exhibits no tenderness.  Abdominal: Soft. Normal appearance and bowel sounds are normal. There is no hepatosplenomegaly. There is no tenderness. There is no rebound, no guarding, no tenderness at McBurney's point and negative Murphy's sign. No hernia.  Musculoskeletal: Normal range of motion.  Neurological: She is alert and oriented to person, place, and time. She has normal strength. No cranial nerve deficit or sensory deficit. Coordination normal. GCS eye subscore is 4. GCS verbal subscore is 5. GCS motor subscore is 6.  Skin: Skin is warm, dry and intact. No rash noted. No cyanosis.  Psychiatric: She has a normal mood and  affect. Her speech is normal and behavior is normal. Thought content normal.  Nursing note and vitals reviewed.    ED Treatments / Results  Labs (all labs ordered are listed, but only abnormal results are displayed) Labs Reviewed  URINALYSIS, ROUTINE W REFLEX MICROSCOPIC - Abnormal; Notable for the following:       Result Value   Leukocytes, UA MODERATE (*)    Bacteria, UA RARE (*)    Squamous Epithelial / LPF 0-5 (*)    All other components within normal limits  URINE CULTURE    EKG  EKG Interpretation None       Radiology No results found.  Procedures Procedures (including critical care time)  Medications Ordered in ED Medications - No data to display   Initial Impression / Assessment and Plan / ED Course  I have reviewed the triage vital signs and the nursing notes.  Pertinent labs & imaging results that were available during my care of the patient were reviewed by me and considered in my medical decision making (see chart for details).     Patient presents with no urine output from  Foley catheter with pelvic pain since her Foley was changed earlier today. Nursing staff found the catheter to be in her vagina, not her urethra. This was removed and exchanged for a new catheter. She immediately began to drain urine with resolution of her symptoms. Urinalysis similar to previous urinalysis, will await culture before initiating any treatment.  Final Clinical Impressions(s) / ED Diagnoses   Final diagnoses:  Foley catheter problem, initial encounter [facility])    New Prescriptions New Prescriptions   No medications on file     Orpah Greek, MD 10/23/16 [phone]

## 2016-10-22 NOTE — Telephone Encounter (Signed)
   Reason for call:   I received a call from [place] the daughter of [name] at 10:20 PM indicating the patient was having urethral and lower abdominal pain and has not had urine output, all of which began after her catheter was changed at 3pm this afternoon.   Pertinent Data:   Last week the patient's [place] had noticed increased urinary sediment, [place] color and suspected a yeast infection. A urine sample was collected by home health at that time but has not yet resulted. The patient's daughter states she has not been acting like her usual self for a few days and looked like she may have a fever today. Michelle Holmes notes that there was discharge noticed when the Foley was changed today.  Michelle Holmes has a chronic foley catheter for neurogenic bladder secondary to poorly controlled diabetes. She was admitted in July for sepsis secondary to UTI complicated by nephrolithiasis causing bilateral ureteral obstruction. At the time of the admission she had bilateral ureteral stents placed, on 7/25  these replaced and she had lithotripsy and extraction of stones.    Assessment / Plan / Recommendations:   Patient is having urinary retention and pain after foley placed earlier today, I am concerned that the foley is not placed in the right location. Her symptoms of AMS and appearing febrile are also concerning for UTI. I advised Michelle Holmes that Michelle Holmes should be brought to the [facility] ED for evaluation and possible replacement of the foley catheter and urinalysis.   When I called to follow up with the daughter at 10:50 the patient had been picked up by EMS and was being transferred to [facility], was found to have an elevated BP. Michelle Holmes has told her daughter that she is giving up and doesn't want to take medications anymore.   As always, pt is advised that if symptoms worsen or new symptoms arise, they should go to an urgent care facility or to to ER for further evaluation.   Ledell Noss, MD   10/22/2016, 10:19  PM

## 2016-10-23 DIAGNOSIS — S3994XA Unspecified injury of external genitals, initial encounter: Secondary | ICD-10-CM | POA: Diagnosis not present

## 2016-10-23 DIAGNOSIS — T83091A Other mechanical complication of indwelling urethral catheter, initial encounter: Secondary | ICD-10-CM | POA: Diagnosis not present

## 2016-10-23 LAB — URINALYSIS, ROUTINE W REFLEX MICROSCOPIC
Bilirubin Urine: NEGATIVE
Glucose, UA: NEGATIVE mg/dL
Hgb urine dipstick: NEGATIVE
Ketones, ur: NEGATIVE mg/dL
Nitrite: NEGATIVE
Protein, ur: NEGATIVE mg/dL
Specific Gravity, Urine: 1.012 (ref 1.005–1.030)
pH: 5 (ref 5.0–8.0)

## 2016-10-25 LAB — URINE CULTURE: Culture: >=100000 — AB

## 2016-10-26 ENCOUNTER — Telehealth: Payer: Self-pay | Admitting: Emergency Medicine

## 2016-10-26 NOTE — Telephone Encounter (Signed)
Post ED Visit - Positive Culture Follow-up  Culture report reviewed by antimicrobial stewardship pharmacist:  []  Elenor Quinones, Pharm.D. []  Heide Guile, Pharm.D., BCPS AQ-ID [x]  Parks Neptune, Pharm.D., BCPS []  Alycia Rossetti, Pharm.D., BCPS []  [place], Pharm.D., BCPS, AAHIVP []  Legrand Como, Pharm.D., BCPS, AAHIVP []  Salome Arnt, PharmD, BCPS []  Dimitri Ped, PharmD, BCPS []  Vincenza Hews, PharmD, BCPS  Positive urine culture Treated with none, asymptomatic, no further patient follow-up is required at this time.  Hazle Nordmann 10/26/2016, 12:05 PM

## 2016-10-27 DIAGNOSIS — I639 Cerebral infarction, unspecified: Secondary | ICD-10-CM | POA: Diagnosis not present

## 2016-10-27 DIAGNOSIS — I69391 Dysphagia following cerebral infarction: Secondary | ICD-10-CM | POA: Diagnosis not present

## 2016-10-27 DIAGNOSIS — L89322 Pressure ulcer of left buttock, stage 2: Secondary | ICD-10-CM | POA: Diagnosis not present

## 2016-10-27 DIAGNOSIS — I5032 Chronic diastolic (congestive) heart failure: Secondary | ICD-10-CM | POA: Diagnosis not present

## 2016-10-27 DIAGNOSIS — I69351 Hemiplegia and hemiparesis following cerebral infarction affecting right dominant side: Secondary | ICD-10-CM | POA: Diagnosis not present

## 2016-10-27 DIAGNOSIS — Z466 Encounter for fitting and adjustment of urinary device: Secondary | ICD-10-CM | POA: Diagnosis not present

## 2016-10-27 DIAGNOSIS — G8111 Spastic hemiplegia affecting right dominant side: Secondary | ICD-10-CM | POA: Diagnosis not present

## 2016-10-27 DIAGNOSIS — M150 Primary generalized (osteo)arthritis: Secondary | ICD-10-CM | POA: Diagnosis not present

## 2016-10-27 DIAGNOSIS — I5040 Unspecified combined systolic (congestive) and diastolic (congestive) heart failure: Secondary | ICD-10-CM | POA: Diagnosis not present

## 2016-10-27 DIAGNOSIS — N132 Hydronephrosis with renal and ureteral calculous obstruction: Secondary | ICD-10-CM | POA: Diagnosis not present

## 2016-10-27 DIAGNOSIS — I130 Hypertensive heart and chronic kidney disease with heart failure and stage 1 through stage 4 chronic kidney disease, or unspecified chronic kidney disease: Secondary | ICD-10-CM | POA: Diagnosis not present

## 2016-10-27 DIAGNOSIS — E1122 Type 2 diabetes mellitus with diabetic chronic kidney disease: Secondary | ICD-10-CM | POA: Diagnosis not present

## 2016-10-27 DIAGNOSIS — D631 Anemia in chronic kidney disease: Secondary | ICD-10-CM | POA: Diagnosis not present

## 2016-10-27 DIAGNOSIS — N390 Urinary tract infection, site not specified: Secondary | ICD-10-CM | POA: Diagnosis not present

## 2016-10-27 DIAGNOSIS — I69320 Aphasia following cerebral infarction: Secondary | ICD-10-CM | POA: Diagnosis not present

## 2016-10-27 DIAGNOSIS — N184 Chronic kidney disease, stage 4 (severe): Secondary | ICD-10-CM | POA: Diagnosis not present

## 2016-10-27 DIAGNOSIS — J439 Emphysema, unspecified: Secondary | ICD-10-CM | POA: Diagnosis not present

## 2016-10-30 ENCOUNTER — Ambulatory Visit (INDEPENDENT_AMBULATORY_CARE_PROVIDER_SITE_OTHER): Payer: Medicare Other | Admitting: Internal Medicine

## 2016-10-30 ENCOUNTER — Encounter: Payer: Self-pay | Admitting: Internal Medicine

## 2016-10-30 VITALS — BP 126/72 | HR 76 | Temp 97.7°F

## 2016-10-30 DIAGNOSIS — I110 Hypertensive heart disease with heart failure: Secondary | ICD-10-CM

## 2016-10-30 DIAGNOSIS — Z0000 Encounter for general adult medical examination without abnormal findings: Secondary | ICD-10-CM

## 2016-10-30 DIAGNOSIS — G8929 Other chronic pain: Secondary | ICD-10-CM

## 2016-10-30 DIAGNOSIS — Z79891 Long term (current) use of opiate analgesic: Secondary | ICD-10-CM

## 2016-10-30 DIAGNOSIS — I509 Heart failure, unspecified: Secondary | ICD-10-CM

## 2016-10-30 DIAGNOSIS — E1165 Type 2 diabetes mellitus with hyperglycemia: Secondary | ICD-10-CM

## 2016-10-30 DIAGNOSIS — J449 Chronic obstructive pulmonary disease, unspecified: Secondary | ICD-10-CM

## 2016-10-30 DIAGNOSIS — I252 Old myocardial infarction: Secondary | ICD-10-CM

## 2016-10-30 DIAGNOSIS — I10 Essential (primary) hypertension: Secondary | ICD-10-CM

## 2016-10-30 DIAGNOSIS — Z23 Encounter for immunization: Secondary | ICD-10-CM | POA: Diagnosis not present

## 2016-10-30 DIAGNOSIS — L89153 Pressure ulcer of sacral region, stage 3: Secondary | ICD-10-CM | POA: Diagnosis not present

## 2016-10-30 DIAGNOSIS — S91309A Unspecified open wound, unspecified foot, initial encounter: Secondary | ICD-10-CM | POA: Diagnosis not present

## 2016-10-30 DIAGNOSIS — E1100 Type 2 diabetes mellitus with hyperosmolarity without nonketotic hyperglycemic-hyperosmolar coma (NKHHC): Secondary | ICD-10-CM

## 2016-10-30 DIAGNOSIS — R338 Other retention of urine: Secondary | ICD-10-CM

## 2016-10-30 DIAGNOSIS — E7800 Pure hypercholesterolemia, unspecified: Secondary | ICD-10-CM | POA: Diagnosis not present

## 2016-10-30 DIAGNOSIS — Z8673 Personal history of transient ischemic attack (TIA), and cerebral infarction without residual deficits: Secondary | ICD-10-CM

## 2016-10-30 LAB — POCT GLYCOSYLATED HEMOGLOBIN (HGB A1C): Hemoglobin A1C: 5.7

## 2016-10-30 LAB — GLUCOSE, CAPILLARY: Glucose-Capillary: 141 mg/dL — ABNORMAL HIGH (ref 65–99)

## 2016-10-30 MED ORDER — PNEUMOCOCCAL 13-VAL CONJ VACC IM SUSP
0.5000 mL | Freq: Once | INTRAMUSCULAR | Status: AC
  Administered 2016-10-30: 0.5 mL via INTRAMUSCULAR

## 2016-10-30 MED ORDER — PNEUMOCOCCAL 13-VAL CONJ VACC IM SUSP: 0.5000 mL | INTRAMUSCULAR | Status: DC

## 2016-10-30 MED ORDER — OXYCODONE HCL 20 MG PO TABS: 20.0000 mg | ORAL_TABLET | Freq: Three times a day (TID) | ORAL | 0 refills | Status: DC | PRN

## 2016-10-30 NOTE — Patient Instructions (Addendum)
Thank you for visiting clinic today. If she develop any sign of infection like fever, please contact our clinic. I'm giving you 3 separate prescriptions for oxycodone today. Please follow-up in 3 month.

## 2016-10-30 NOTE — Assessment & Plan Note (Signed)
She is on Foley for about a year now. Recently was seen in ED because of faulty placement of Foley, apparently during the change of Foley, it was placed in her vagina, she was seen in ED when her daughter noticed no urinary output and patient being little uncomfortable. Foley was found in vagina. New Foley was placed with corrected position. UA and urine culture were sent during that ED visit, culture shows more than 100,000 colonies of Escherichia coli. Patient does not has any urinary symptoms. She denies any fever or chills.  We will not treat her UTI as it can be a colonization because of chronic catheter. She was advised to contact clinic if she develops any urinary symptoms or fever.

## 2016-10-30 NOTE — Progress Notes (Signed)
   CC: For follow-up of her diabetes and chronic pain.  HPI:  [name] is a 66 y.o. with past medical history as listed below came to the clinic for follow-up of her diabetes and chronic pain.  According to patient she only takes oxycodone 20 mg 3 times a day which really helps with her pain. She was not taking any other medications at this time.  She has no other complaints today. She denies any urinary symptoms. She denied any fever or chills.  Please see assessment and plan for her chronic problems.  Past Medical History:  Diagnosis Date  . Anemia   . CHF (congestive heart failure) ([place])   . Chronic back pain   . COPD (chronic obstructive pulmonary disease) ([place])   . Coronary artery disease   . Diabetes mellitus   . DJD (degenerative joint disease)   . Emphysema   . Hypercholesteremia   . Hypertension   . Myocardial infarction ([place])   . Obesity   . Obesity hypoventilation syndrome ([place])   . Renal insufficiency   . Scoliosis   . Sleep apnea   . Stroke ([place])   . TIA (transient ischemic attack)    Review of Systems:  Negative except mentioned in history of present illness.  Physical Exam:  Vitals:   10/30/16 1439  BP: 126/72  Pulse: 76  Temp: 97.7 F (36.5 C)  TempSrc: Oral  SpO2: 100%    General: Vital signs reviewed.  Patient is well-developed and well-nourished, in no acute distress and cooperative with exam.  Head: Normocephalic and atraumatic. Cardiovascular: RRR, S1 normal, S2 normal, no murmurs, gallops, or rubs. Pulmonary/Chest: Clear to auscultation bilaterally, no wheezes, rales, or rhonchi. Abdominal: Soft, non-tender, non-distended, BS +, no masses, organomegaly, or guarding present.  Extremities: No lower extremity edema bilaterally,  pulses symmetric and intact bilaterally. No cyanosis or clubbing. Neurological: A&O x3, Right hemiparesis, strength 3/5 in left lower extremity and 4/5 in left upper extremity cranial nerve II-XII are grossly  intact. Skin: Warm, dry and intact. No rashes or erythema. Psychiatric: Normal mood and affect. speech and behavior is normal. Cognition and memory are normal.  Assessment & Plan:   See Encounters Tab for problem based charting.  Patient discussed with Dr. Dareen Piano.

## 2016-10-30 NOTE — Assessment & Plan Note (Signed)
BP Readings from Last 3 Encounters:  10/30/16 126/72  10/23/16 101/62  08/18/16 130/70   Patient was normotensive, she is not taking any medications currently.  Continue to monitor.

## 2016-10-30 NOTE — Assessment & Plan Note (Addendum)
According to patient oxycodone 3 times a day really helped her pain.  Urine toxicology was ordered according to protocol.  She was given 3 separate prescriptions of oxycodone 20 mg, 90 tablets each.  Follow-up in 3 month.  Addendum. Her urine toxicology came back negative for oxycodone. According to patient she was taking oxycodone 20 mg 3 times a day and did took her medicines before coming to the clinic. We will discuss her results during next follow-up visit, to explore any chances of diversion. We will also recheck her urine during that visit. She might not need that many tablets, we will consider decreasing her dose if needed.

## 2016-10-30 NOTE — Assessment & Plan Note (Signed)
Her A1c today was 5.7.  Her caregiver continued to give her Lantus 10 units on the days of her blood sugar above 200, which is normally once a week.  She does not need any insulin at this time. Caregiver insist on continue the same practice by checking blood sugar every morning and giving her Lantus if her blood sugar is above 200. She do not have any episode of hypoglycemia.  They can continue same practice, as they have plenty of Lantus at home. I discontinue her Lantus and NovoLog.

## 2016-11-02 NOTE — Progress Notes (Signed)
Internal Medicine Clinic Attending  Case discussed with Dr. Amin at the time of the visit.  We reviewed the resident's history and exam and pertinent patient test results.  I agree with the assessment, diagnosis, and plan of care documented in the resident's note.    

## 2016-11-05 LAB — TOXASSURE SELECT,+ANTIDEPR,UR

## 2016-11-06 DIAGNOSIS — I69391 Dysphagia following cerebral infarction: Secondary | ICD-10-CM | POA: Diagnosis not present

## 2016-11-06 DIAGNOSIS — L89322 Pressure ulcer of left buttock, stage 2: Secondary | ICD-10-CM | POA: Diagnosis not present

## 2016-11-06 DIAGNOSIS — N184 Chronic kidney disease, stage 4 (severe): Secondary | ICD-10-CM | POA: Diagnosis not present

## 2016-11-06 DIAGNOSIS — D631 Anemia in chronic kidney disease: Secondary | ICD-10-CM | POA: Diagnosis not present

## 2016-11-06 DIAGNOSIS — Z466 Encounter for fitting and adjustment of urinary device: Secondary | ICD-10-CM | POA: Diagnosis not present

## 2016-11-06 DIAGNOSIS — I69351 Hemiplegia and hemiparesis following cerebral infarction affecting right dominant side: Secondary | ICD-10-CM | POA: Diagnosis not present

## 2016-11-06 DIAGNOSIS — I130 Hypertensive heart and chronic kidney disease with heart failure and stage 1 through stage 4 chronic kidney disease, or unspecified chronic kidney disease: Secondary | ICD-10-CM | POA: Diagnosis not present

## 2016-11-06 DIAGNOSIS — N390 Urinary tract infection, site not specified: Secondary | ICD-10-CM | POA: Diagnosis not present

## 2016-11-06 DIAGNOSIS — I5032 Chronic diastolic (congestive) heart failure: Secondary | ICD-10-CM | POA: Diagnosis not present

## 2016-11-06 DIAGNOSIS — N132 Hydronephrosis with renal and ureteral calculous obstruction: Secondary | ICD-10-CM | POA: Diagnosis not present

## 2016-11-06 DIAGNOSIS — I69320 Aphasia following cerebral infarction: Secondary | ICD-10-CM | POA: Diagnosis not present

## 2016-11-06 DIAGNOSIS — E1122 Type 2 diabetes mellitus with diabetic chronic kidney disease: Secondary | ICD-10-CM | POA: Diagnosis not present

## 2016-11-06 DIAGNOSIS — J439 Emphysema, unspecified: Secondary | ICD-10-CM | POA: Diagnosis not present

## 2016-11-10 ENCOUNTER — Telehealth: Payer: Self-pay | Admitting: *Deleted

## 2016-11-10 NOTE — Telephone Encounter (Signed)
Received call from Nicolette with [facility] requesting the following verbal orders: one visit every other week for 9 weeks for catheter changes and 2 additional as needed visits.  Verbal authorization given over the [facility] will follow.  Will send to pcp to make sure she is in agreement.Regenia Skeeter, Shatima Zalar Cassady10/16/201811:29 AM

## 2016-11-13 DIAGNOSIS — E43 Unspecified severe protein-calorie malnutrition: Secondary | ICD-10-CM | POA: Diagnosis not present

## 2016-11-13 DIAGNOSIS — I130 Hypertensive heart and chronic kidney disease with heart failure and stage 1 through stage 4 chronic kidney disease, or unspecified chronic kidney disease: Secondary | ICD-10-CM | POA: Diagnosis not present

## 2016-11-13 DIAGNOSIS — I5032 Chronic diastolic (congestive) heart failure: Secondary | ICD-10-CM | POA: Diagnosis not present

## 2016-11-13 DIAGNOSIS — I2510 Atherosclerotic heart disease of native coronary artery without angina pectoris: Secondary | ICD-10-CM | POA: Diagnosis not present

## 2016-11-13 DIAGNOSIS — I69391 Dysphagia following cerebral infarction: Secondary | ICD-10-CM | POA: Diagnosis not present

## 2016-11-13 DIAGNOSIS — Z86718 Personal history of other venous thrombosis and embolism: Secondary | ICD-10-CM | POA: Diagnosis not present

## 2016-11-13 DIAGNOSIS — N184 Chronic kidney disease, stage 4 (severe): Secondary | ICD-10-CM | POA: Diagnosis not present

## 2016-11-13 DIAGNOSIS — N132 Hydronephrosis with renal and ureteral calculous obstruction: Secondary | ICD-10-CM | POA: Diagnosis not present

## 2016-11-13 DIAGNOSIS — Z9181 History of falling: Secondary | ICD-10-CM | POA: Diagnosis not present

## 2016-11-13 DIAGNOSIS — Z87440 Personal history of urinary (tract) infections: Secondary | ICD-10-CM | POA: Diagnosis not present

## 2016-11-13 DIAGNOSIS — E1122 Type 2 diabetes mellitus with diabetic chronic kidney disease: Secondary | ICD-10-CM | POA: Diagnosis not present

## 2016-11-13 DIAGNOSIS — J439 Emphysema, unspecified: Secondary | ICD-10-CM | POA: Diagnosis not present

## 2016-11-13 DIAGNOSIS — R1310 Dysphagia, unspecified: Secondary | ICD-10-CM | POA: Diagnosis not present

## 2016-11-13 DIAGNOSIS — I69320 Aphasia following cerebral infarction: Secondary | ICD-10-CM | POA: Diagnosis not present

## 2016-11-13 DIAGNOSIS — D631 Anemia in chronic kidney disease: Secondary | ICD-10-CM | POA: Diagnosis not present

## 2016-11-13 DIAGNOSIS — I69351 Hemiplegia and hemiparesis following cerebral infarction affecting right dominant side: Secondary | ICD-10-CM | POA: Diagnosis not present

## 2016-11-13 DIAGNOSIS — M1990 Unspecified osteoarthritis, unspecified site: Secondary | ICD-10-CM | POA: Diagnosis not present

## 2016-11-13 DIAGNOSIS — Z466 Encounter for fitting and adjustment of urinary device: Secondary | ICD-10-CM | POA: Diagnosis not present

## 2016-11-13 DIAGNOSIS — G4733 Obstructive sleep apnea (adult) (pediatric): Secondary | ICD-10-CM | POA: Diagnosis not present

## 2016-11-19 ENCOUNTER — Other Ambulatory Visit: Payer: Self-pay

## 2016-11-19 NOTE — Patient Outreach (Signed)
[facility]) Care Management  11/19/2016  KAIDAN SPENGLER [birth year] [id number]   Medication Adherence call to Mrs. Noreene Larsson the reason for this call is because Mrs. Bauder is showing past due under [place] [employer].on Lisinopril 10 mg spoke to patient husband he said patient has discontinued all her medication at this time and she has a new doctor.   [place] Management Direct Dial [phone]  Fax [phone] [email or web address]

## 2016-11-21 DIAGNOSIS — J449 Chronic obstructive pulmonary disease, unspecified: Secondary | ICD-10-CM | POA: Diagnosis not present

## 2016-11-25 DIAGNOSIS — G4733 Obstructive sleep apnea (adult) (pediatric): Secondary | ICD-10-CM | POA: Diagnosis not present

## 2016-11-25 DIAGNOSIS — I130 Hypertensive heart and chronic kidney disease with heart failure and stage 1 through stage 4 chronic kidney disease, or unspecified chronic kidney disease: Secondary | ICD-10-CM | POA: Diagnosis not present

## 2016-11-25 DIAGNOSIS — E1122 Type 2 diabetes mellitus with diabetic chronic kidney disease: Secondary | ICD-10-CM | POA: Diagnosis not present

## 2016-11-25 DIAGNOSIS — R1310 Dysphagia, unspecified: Secondary | ICD-10-CM | POA: Diagnosis not present

## 2016-11-25 DIAGNOSIS — D631 Anemia in chronic kidney disease: Secondary | ICD-10-CM | POA: Diagnosis not present

## 2016-11-25 DIAGNOSIS — I69320 Aphasia following cerebral infarction: Secondary | ICD-10-CM | POA: Diagnosis not present

## 2016-11-25 DIAGNOSIS — Z87440 Personal history of urinary (tract) infections: Secondary | ICD-10-CM | POA: Diagnosis not present

## 2016-11-25 DIAGNOSIS — Z86718 Personal history of other venous thrombosis and embolism: Secondary | ICD-10-CM | POA: Diagnosis not present

## 2016-11-25 DIAGNOSIS — M1990 Unspecified osteoarthritis, unspecified site: Secondary | ICD-10-CM | POA: Diagnosis not present

## 2016-11-25 DIAGNOSIS — J439 Emphysema, unspecified: Secondary | ICD-10-CM | POA: Diagnosis not present

## 2016-11-25 DIAGNOSIS — N132 Hydronephrosis with renal and ureteral calculous obstruction: Secondary | ICD-10-CM | POA: Diagnosis not present

## 2016-11-25 DIAGNOSIS — I69351 Hemiplegia and hemiparesis following cerebral infarction affecting right dominant side: Secondary | ICD-10-CM | POA: Diagnosis not present

## 2016-11-25 DIAGNOSIS — E43 Unspecified severe protein-calorie malnutrition: Secondary | ICD-10-CM | POA: Diagnosis not present

## 2016-11-25 DIAGNOSIS — I5032 Chronic diastolic (congestive) heart failure: Secondary | ICD-10-CM | POA: Diagnosis not present

## 2016-11-25 DIAGNOSIS — Z9181 History of falling: Secondary | ICD-10-CM | POA: Diagnosis not present

## 2016-11-25 DIAGNOSIS — Z466 Encounter for fitting and adjustment of urinary device: Secondary | ICD-10-CM | POA: Diagnosis not present

## 2016-11-25 DIAGNOSIS — I2510 Atherosclerotic heart disease of native coronary artery without angina pectoris: Secondary | ICD-10-CM | POA: Diagnosis not present

## 2016-11-25 DIAGNOSIS — I69391 Dysphagia following cerebral infarction: Secondary | ICD-10-CM | POA: Diagnosis not present

## 2016-11-25 DIAGNOSIS — N184 Chronic kidney disease, stage 4 (severe): Secondary | ICD-10-CM | POA: Diagnosis not present

## 2016-11-30 DIAGNOSIS — L89153 Pressure ulcer of sacral region, stage 3: Secondary | ICD-10-CM | POA: Diagnosis not present

## 2016-11-30 DIAGNOSIS — S91309A Unspecified open wound, unspecified foot, initial encounter: Secondary | ICD-10-CM | POA: Diagnosis not present

## 2016-12-08 DIAGNOSIS — Z466 Encounter for fitting and adjustment of urinary device: Secondary | ICD-10-CM | POA: Diagnosis not present

## 2016-12-08 DIAGNOSIS — R1310 Dysphagia, unspecified: Secondary | ICD-10-CM | POA: Diagnosis not present

## 2016-12-08 DIAGNOSIS — Z9181 History of falling: Secondary | ICD-10-CM | POA: Diagnosis not present

## 2016-12-08 DIAGNOSIS — Z87440 Personal history of urinary (tract) infections: Secondary | ICD-10-CM | POA: Diagnosis not present

## 2016-12-08 DIAGNOSIS — E1122 Type 2 diabetes mellitus with diabetic chronic kidney disease: Secondary | ICD-10-CM | POA: Diagnosis not present

## 2016-12-08 DIAGNOSIS — I69320 Aphasia following cerebral infarction: Secondary | ICD-10-CM | POA: Diagnosis not present

## 2016-12-08 DIAGNOSIS — I69391 Dysphagia following cerebral infarction: Secondary | ICD-10-CM | POA: Diagnosis not present

## 2016-12-08 DIAGNOSIS — M1990 Unspecified osteoarthritis, unspecified site: Secondary | ICD-10-CM | POA: Diagnosis not present

## 2016-12-08 DIAGNOSIS — G4733 Obstructive sleep apnea (adult) (pediatric): Secondary | ICD-10-CM | POA: Diagnosis not present

## 2016-12-08 DIAGNOSIS — I130 Hypertensive heart and chronic kidney disease with heart failure and stage 1 through stage 4 chronic kidney disease, or unspecified chronic kidney disease: Secondary | ICD-10-CM | POA: Diagnosis not present

## 2016-12-08 DIAGNOSIS — I2510 Atherosclerotic heart disease of native coronary artery without angina pectoris: Secondary | ICD-10-CM | POA: Diagnosis not present

## 2016-12-08 DIAGNOSIS — E43 Unspecified severe protein-calorie malnutrition: Secondary | ICD-10-CM | POA: Diagnosis not present

## 2016-12-08 DIAGNOSIS — Z86718 Personal history of other venous thrombosis and embolism: Secondary | ICD-10-CM | POA: Diagnosis not present

## 2016-12-08 DIAGNOSIS — N184 Chronic kidney disease, stage 4 (severe): Secondary | ICD-10-CM | POA: Diagnosis not present

## 2016-12-08 DIAGNOSIS — I5032 Chronic diastolic (congestive) heart failure: Secondary | ICD-10-CM | POA: Diagnosis not present

## 2016-12-08 DIAGNOSIS — D631 Anemia in chronic kidney disease: Secondary | ICD-10-CM | POA: Diagnosis not present

## 2016-12-08 DIAGNOSIS — I69351 Hemiplegia and hemiparesis following cerebral infarction affecting right dominant side: Secondary | ICD-10-CM | POA: Diagnosis not present

## 2016-12-08 DIAGNOSIS — J439 Emphysema, unspecified: Secondary | ICD-10-CM | POA: Diagnosis not present

## 2016-12-08 DIAGNOSIS — N132 Hydronephrosis with renal and ureteral calculous obstruction: Secondary | ICD-10-CM | POA: Diagnosis not present

## 2016-12-10 DIAGNOSIS — J439 Emphysema, unspecified: Secondary | ICD-10-CM | POA: Diagnosis not present

## 2016-12-10 DIAGNOSIS — D631 Anemia in chronic kidney disease: Secondary | ICD-10-CM | POA: Diagnosis not present

## 2016-12-10 DIAGNOSIS — N132 Hydronephrosis with renal and ureteral calculous obstruction: Secondary | ICD-10-CM | POA: Diagnosis not present

## 2016-12-10 DIAGNOSIS — I69391 Dysphagia following cerebral infarction: Secondary | ICD-10-CM | POA: Diagnosis not present

## 2016-12-10 DIAGNOSIS — N184 Chronic kidney disease, stage 4 (severe): Secondary | ICD-10-CM | POA: Diagnosis not present

## 2016-12-10 DIAGNOSIS — E43 Unspecified severe protein-calorie malnutrition: Secondary | ICD-10-CM | POA: Diagnosis not present

## 2016-12-10 DIAGNOSIS — E1122 Type 2 diabetes mellitus with diabetic chronic kidney disease: Secondary | ICD-10-CM | POA: Diagnosis not present

## 2016-12-10 DIAGNOSIS — I5032 Chronic diastolic (congestive) heart failure: Secondary | ICD-10-CM | POA: Diagnosis not present

## 2016-12-10 DIAGNOSIS — I69320 Aphasia following cerebral infarction: Secondary | ICD-10-CM | POA: Diagnosis not present

## 2016-12-10 DIAGNOSIS — Z86718 Personal history of other venous thrombosis and embolism: Secondary | ICD-10-CM | POA: Diagnosis not present

## 2016-12-10 DIAGNOSIS — M1990 Unspecified osteoarthritis, unspecified site: Secondary | ICD-10-CM | POA: Diagnosis not present

## 2016-12-10 DIAGNOSIS — I69351 Hemiplegia and hemiparesis following cerebral infarction affecting right dominant side: Secondary | ICD-10-CM | POA: Diagnosis not present

## 2016-12-10 DIAGNOSIS — Z87440 Personal history of urinary (tract) infections: Secondary | ICD-10-CM | POA: Diagnosis not present

## 2016-12-10 DIAGNOSIS — I2510 Atherosclerotic heart disease of native coronary artery without angina pectoris: Secondary | ICD-10-CM | POA: Diagnosis not present

## 2016-12-10 DIAGNOSIS — R1310 Dysphagia, unspecified: Secondary | ICD-10-CM | POA: Diagnosis not present

## 2016-12-10 DIAGNOSIS — I130 Hypertensive heart and chronic kidney disease with heart failure and stage 1 through stage 4 chronic kidney disease, or unspecified chronic kidney disease: Secondary | ICD-10-CM | POA: Diagnosis not present

## 2016-12-10 DIAGNOSIS — Z466 Encounter for fitting and adjustment of urinary device: Secondary | ICD-10-CM | POA: Diagnosis not present

## 2016-12-10 DIAGNOSIS — G4733 Obstructive sleep apnea (adult) (pediatric): Secondary | ICD-10-CM | POA: Diagnosis not present

## 2016-12-10 DIAGNOSIS — Z9181 History of falling: Secondary | ICD-10-CM | POA: Diagnosis not present

## 2016-12-14 DIAGNOSIS — Z466 Encounter for fitting and adjustment of urinary device: Secondary | ICD-10-CM | POA: Diagnosis not present

## 2016-12-22 DIAGNOSIS — J449 Chronic obstructive pulmonary disease, unspecified: Secondary | ICD-10-CM | POA: Diagnosis not present

## 2016-12-30 DIAGNOSIS — L89153 Pressure ulcer of sacral region, stage 3: Secondary | ICD-10-CM | POA: Diagnosis not present

## 2016-12-30 DIAGNOSIS — S91309A Unspecified open wound, unspecified foot, initial encounter: Secondary | ICD-10-CM | POA: Diagnosis not present

## 2017-01-05 DIAGNOSIS — E1122 Type 2 diabetes mellitus with diabetic chronic kidney disease: Secondary | ICD-10-CM | POA: Diagnosis not present

## 2017-01-05 DIAGNOSIS — Z466 Encounter for fitting and adjustment of urinary device: Secondary | ICD-10-CM | POA: Diagnosis not present

## 2017-01-05 DIAGNOSIS — Z86718 Personal history of other venous thrombosis and embolism: Secondary | ICD-10-CM | POA: Diagnosis not present

## 2017-01-05 DIAGNOSIS — N184 Chronic kidney disease, stage 4 (severe): Secondary | ICD-10-CM | POA: Diagnosis not present

## 2017-01-05 DIAGNOSIS — I69320 Aphasia following cerebral infarction: Secondary | ICD-10-CM | POA: Diagnosis not present

## 2017-01-05 DIAGNOSIS — N132 Hydronephrosis with renal and ureteral calculous obstruction: Secondary | ICD-10-CM | POA: Diagnosis not present

## 2017-01-05 DIAGNOSIS — I2510 Atherosclerotic heart disease of native coronary artery without angina pectoris: Secondary | ICD-10-CM | POA: Diagnosis not present

## 2017-01-05 DIAGNOSIS — I69391 Dysphagia following cerebral infarction: Secondary | ICD-10-CM | POA: Diagnosis not present

## 2017-01-05 DIAGNOSIS — I130 Hypertensive heart and chronic kidney disease with heart failure and stage 1 through stage 4 chronic kidney disease, or unspecified chronic kidney disease: Secondary | ICD-10-CM | POA: Diagnosis not present

## 2017-01-05 DIAGNOSIS — E43 Unspecified severe protein-calorie malnutrition: Secondary | ICD-10-CM | POA: Diagnosis not present

## 2017-01-05 DIAGNOSIS — Z87440 Personal history of urinary (tract) infections: Secondary | ICD-10-CM | POA: Diagnosis not present

## 2017-01-05 DIAGNOSIS — M1990 Unspecified osteoarthritis, unspecified site: Secondary | ICD-10-CM | POA: Diagnosis not present

## 2017-01-05 DIAGNOSIS — D631 Anemia in chronic kidney disease: Secondary | ICD-10-CM | POA: Diagnosis not present

## 2017-01-05 DIAGNOSIS — J439 Emphysema, unspecified: Secondary | ICD-10-CM | POA: Diagnosis not present

## 2017-01-05 DIAGNOSIS — I5032 Chronic diastolic (congestive) heart failure: Secondary | ICD-10-CM | POA: Diagnosis not present

## 2017-01-05 DIAGNOSIS — R1310 Dysphagia, unspecified: Secondary | ICD-10-CM | POA: Diagnosis not present

## 2017-01-05 DIAGNOSIS — I69351 Hemiplegia and hemiparesis following cerebral infarction affecting right dominant side: Secondary | ICD-10-CM | POA: Diagnosis not present

## 2017-01-05 DIAGNOSIS — G4733 Obstructive sleep apnea (adult) (pediatric): Secondary | ICD-10-CM | POA: Diagnosis not present

## 2017-01-05 DIAGNOSIS — Z9181 History of falling: Secondary | ICD-10-CM | POA: Diagnosis not present

## 2017-01-09 ENCOUNTER — Emergency Department (HOSPITAL_COMMUNITY)
Admission: EM | Admit: 2017-01-09 | Discharge: 2017-01-10 | Disposition: A | Discharge status: Home / Self Care | Payer: Medicare Other | Attending: Emergency Medicine | Admitting: Emergency Medicine

## 2017-01-09 ENCOUNTER — Encounter (HOSPITAL_COMMUNITY): Payer: Self-pay | Admitting: Emergency Medicine

## 2017-01-09 DIAGNOSIS — I2510 Atherosclerotic heart disease of native coronary artery without angina pectoris: Secondary | ICD-10-CM | POA: Diagnosis not present

## 2017-01-09 DIAGNOSIS — R1030 Lower abdominal pain, unspecified: Secondary | ICD-10-CM | POA: Diagnosis present

## 2017-01-09 DIAGNOSIS — T839XXA Unspecified complication of genitourinary prosthetic device, implant and graft, initial encounter: Secondary | ICD-10-CM

## 2017-01-09 DIAGNOSIS — Z8673 Personal history of transient ischemic attack (TIA), and cerebral infarction without residual deficits: Secondary | ICD-10-CM | POA: Diagnosis not present

## 2017-01-09 DIAGNOSIS — H1012 Acute atopic conjunctivitis, left eye: Secondary | ICD-10-CM | POA: Diagnosis not present

## 2017-01-09 DIAGNOSIS — Y829 Unspecified medical devices associated with adverse incidents: Secondary | ICD-10-CM | POA: Insufficient documentation

## 2017-01-09 DIAGNOSIS — H1032 Unspecified acute conjunctivitis, left eye: Secondary | ICD-10-CM | POA: Diagnosis not present

## 2017-01-09 DIAGNOSIS — Z794 Long term (current) use of insulin: Secondary | ICD-10-CM | POA: Diagnosis not present

## 2017-01-09 DIAGNOSIS — I252 Old myocardial infarction: Secondary | ICD-10-CM | POA: Insufficient documentation

## 2017-01-09 DIAGNOSIS — J449 Chronic obstructive pulmonary disease, unspecified: Secondary | ICD-10-CM | POA: Insufficient documentation

## 2017-01-09 DIAGNOSIS — B9689 Other specified bacterial agents as the cause of diseases classified elsewhere: Secondary | ICD-10-CM | POA: Diagnosis not present

## 2017-01-09 DIAGNOSIS — R109 Unspecified abdominal pain: Secondary | ICD-10-CM | POA: Diagnosis not present

## 2017-01-09 DIAGNOSIS — Z7901 Long term (current) use of anticoagulants: Secondary | ICD-10-CM | POA: Insufficient documentation

## 2017-01-09 DIAGNOSIS — N184 Chronic kidney disease, stage 4 (severe): Secondary | ICD-10-CM | POA: Diagnosis not present

## 2017-01-09 DIAGNOSIS — T83098A Other mechanical complication of other indwelling urethral catheter, initial encounter: Secondary | ICD-10-CM | POA: Diagnosis not present

## 2017-01-09 DIAGNOSIS — I129 Hypertensive chronic kidney disease with stage 1 through stage 4 chronic kidney disease, or unspecified chronic kidney disease: Secondary | ICD-10-CM | POA: Insufficient documentation

## 2017-01-09 DIAGNOSIS — E1122 Type 2 diabetes mellitus with diabetic chronic kidney disease: Secondary | ICD-10-CM | POA: Diagnosis not present

## 2017-01-09 DIAGNOSIS — N3000 Acute cystitis without hematuria: Secondary | ICD-10-CM | POA: Insufficient documentation

## 2017-01-09 LAB — CBC WITH DIFFERENTIAL/PLATELET
Basophils Absolute: 0 10*3/uL (ref 0.0–0.1)
Basophils Relative: 0 %
Eosinophils Absolute: 0.2 10*3/uL (ref 0.0–0.7)
Eosinophils Relative: 2 %
HCT: 27.5 % — ABNORMAL LOW (ref 36.0–46.0)
Hemoglobin: 9.4 g/dL — ABNORMAL LOW (ref 12.0–15.0)
Lymphocytes Relative: 20 %
Lymphs Abs: 2.4 10*3/uL (ref 0.7–4.0)
MCH: 32.2 pg (ref 26.0–34.0)
MCHC: 34.2 g/dL (ref 30.0–36.0)
MCV: 94.2 fL (ref 78.0–100.0)
Monocytes Absolute: 0.5 10*3/uL (ref 0.1–1.0)
Monocytes Relative: 4 %
Neutro Abs: 9.1 10*3/uL — ABNORMAL HIGH (ref 1.7–7.7)
Neutrophils Relative %: 74 %
Platelets: 243 10*3/uL (ref 150–400)
RBC: 2.92 MIL/uL — ABNORMAL LOW (ref 3.87–5.11)
RDW: 15.9 % — ABNORMAL HIGH (ref 11.5–15.5)
WBC: 12.2 10*3/uL — ABNORMAL HIGH (ref 4.0–10.5)

## 2017-01-09 LAB — URINALYSIS, ROUTINE W REFLEX MICROSCOPIC
Bilirubin Urine: NEGATIVE
Glucose, UA: NEGATIVE mg/dL
Ketones, ur: NEGATIVE mg/dL
Nitrite: NEGATIVE
Protein, ur: 30 mg/dL — AB
Specific Gravity, Urine: 1.011 (ref 1.005–1.030)
pH: 6 (ref 5.0–8.0)

## 2017-01-09 LAB — COMPREHENSIVE METABOLIC PANEL
ALT: 8 U/L — ABNORMAL LOW (ref 14–54)
AST: 14 U/L — ABNORMAL LOW (ref 15–41)
Albumin: 3.9 g/dL (ref 3.5–5.0)
Alkaline Phosphatase: 64 U/L (ref 38–126)
Anion gap: 9 (ref 5–15)
BUN: 54 mg/dL — ABNORMAL HIGH (ref 6–20)
CO2: 16 mmol/L — ABNORMAL LOW (ref 22–32)
Calcium: 9.5 mg/dL (ref 8.9–10.3)
Chloride: 116 mmol/L — ABNORMAL HIGH (ref 101–111)
Creatinine, Ser: 2.22 mg/dL — ABNORMAL HIGH (ref 0.44–1.00)
GFR calc Af Amer: 26 mL/min — ABNORMAL LOW (ref >60)
GFR calc non Af Amer: 22 mL/min — ABNORMAL LOW (ref >60)
Glucose, Bld: 176 mg/dL — ABNORMAL HIGH (ref 65–99)
Potassium: 3.5 mmol/L (ref 3.5–5.1)
Sodium: 141 mmol/L (ref 135–145)
Total Bilirubin: 0.6 mg/dL (ref 0.3–1.2)
Total Protein: 7.8 g/dL (ref 6.5–8.1)

## 2017-01-09 LAB — I-STAT CG4 LACTIC ACID, ED: Lactic Acid, Venous: 2.71 mmol/L (ref 0.5–1.9)

## 2017-01-09 LAB — LIPASE, BLOOD: Lipase: 40 U/L (ref 11–51)

## 2017-01-09 LAB — CBG MONITORING, ED: Glucose-Capillary: 135 mg/dL — ABNORMAL HIGH (ref 65–99)

## 2017-01-09 MED ORDER — SODIUM CHLORIDE 0.9 % IV SOLN
Freq: Once | INTRAVENOUS | Status: AC
  Administered 2017-01-09: 22:00:00 via INTRAVENOUS

## 2017-01-09 NOTE — ED Triage Notes (Signed)
Pt comes to ed, via ems, c/o chronic abdominal pain/ paraplegic,runny stools x3 in last hour and half.pt has hx of diabetes, HTN. Lives at home w niece whom is her care giver. Ems verbalizes confused and alert per baseline. Pt verbalizes lower abdominal pain has been on going, on her lower abdominal area bilaterally. Pt screaming in pain. V/s on arrival 158/94, pulse 86, spo2 100 ra, rr 18. cbg 202.

## 2017-01-09 NOTE — ED Notes (Signed)
2.71 mmol/L Lactic Acid. Johnney Killian, M.D. and Osker Mason Doren Custard, RN, notified

## 2017-01-09 NOTE — ED Provider Notes (Signed)
[facility] DEPT Provider Note   CSN: [id number] Arrival date & time: 01/09/17  1939     History   Chief Complaint Chief Complaint  Patient presents with  . Abdominal Pain    lower     HPI Michelle Holmes is a 66 y.o. female.  HPI She is cared for in her home by her niece.  She has a chronic indwelling Foley catheter.  Her niece reports that she started getting a lot of discomfort around the catheter and discharge around it.  It was supposed to have been changed earlier in the week but she reports she would not  let her care giver change it at that time.  She has since started to complain of some lower abdominal discomfort.  No documented fevers.  Her niece reports that she likes to drink a lot of soda and will not drink much water.  He has not had vomiting.  She has had a couple of episodes of loose stool.  No change in mental status.  Patient is alert and interactive.  Once Foley catheter was changed, patient denied that she is having any pain or discomfort.  I have noticed she has drainage around the left eye, patient reports has been ongoing denies any pain or discomfort. Past Medical History:  Diagnosis Date  . Anemia   . CHF (congestive heart failure) ([place])   . Chronic back pain   . COPD (chronic obstructive pulmonary disease) ([place])   . Coronary artery disease   . Diabetes mellitus   . DJD (degenerative joint disease)   . Emphysema   . Hypercholesteremia   . Hypertension   . Myocardial infarction ([place])   . Obesity   . Obesity hypoventilation syndrome ([place])   . Renal insufficiency   . Scoliosis   . Sleep apnea   . Stroke ([place])   . TIA (transient ischemic attack)     Patient Active Problem List   Diagnosis Date Noted  . Stroke (cerebrum) ([place]) 08/04/2016  . CKD (chronic kidney disease), stage IV ([place]) 08/04/2016  . Kidney stones 08/04/2016  . Long-term current use of opiate analgesic 03/05/2016  . Essential hypertension 03/05/2016  .  Chronic diastolic (congestive) heart failure ([place]) 01/28/2016  . Protein-calorie malnutrition, severe 01/28/2016  . Healthcare maintenance   . Palliative care by specialist   . Anemia in other chronic diseases classified elsewhere   . Acute urinary retention   . Pressure injury of skin 11/24/2015  . Chronic kidney disease (CKD), stage IV (severe) ([place]) 07/06/2015  . 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 Spastic hemiplegia affecting nondominant side ([place]) 07/01/2015  . Obesity, morbid ([place]) 07/01/2015  . Thrombocytopenia ([place])   . Dysarthria   . Lethargy   . HLD (hyperlipidemia)   . Chronic obstructive pulmonary disease ([place])   . Hemiparesis, aphasia, and dysphagia as late effect of cerebrovascular accident (CVA) ([place])   . Right hemiplegia ([place])   . OSA on CPAP 12/08/2010  . Morbid obesity ([place]) 12/08/2010  . CAD (coronary artery disease) 12/08/2010  . Uncontrolled type 2 DM with hyperosmolar nonketotic hyperglycemia ([place]) 12/01/2010    Past Surgical History:  Procedure Laterality Date  . bil uterscospy     08/18/16 Dr. Jeffie Pollock  . COLON SURGERY    . CORONARY ANGIOPLASTY WITH STENT PLACEMENT    . CYSTOSCOPY WITH STENT PLACEMENT Bilateral 08/04/2016   Procedure: CYSTOSCOPY WITH STENT PLACEMENT bilateral bilateral retrograde fecal disimpaction;  Surgeon: Ardis Hughs, MD;  Location:  [facility];  Service: Urology;  Laterality: Bilateral;  . CYSTOSCOPY/URETEROSCOPY/HOLMIUM LASER/STENT PLACEMENT Bilateral 08/18/2016   Procedure: BILATERAL URETEROSCOPY WITH HOLMIUM LASER AND STENTS;  Surgeon: Irine Seal, MD;  Location: [facility];  Service: Urology;  Laterality: Bilateral;    OB History    No data available       Home Medications    Prior to Admission medications   Medication Sig Start Date End Date Taking? Authorizing Provider  LANTUS SOLOSTAR 100 UNIT/ML Solostar Pen Inject 10 Units into the skin daily. 12/21/16  Yes [provider]  Oxycodone HCl 20 MG TABS Take 1  tablet (20 mg total) by mouth 3 (three) times daily as needed. 10/30/16  Yes Lorella Nimrod, MD  apixaban (ELIQUIS) 2.5 MG TABS tablet Take 1 tablet (2.5 mg total) by mouth 2 (two) times daily. 08/22/16 10/22/16  Filippou, Braxton Feathers, MD  cephALEXin (KEFLEX) 500 MG capsule Take 2 capsules (1,000 mg total) by mouth 2 (two) times daily. 01/10/17   Charlesetta Shanks, MD  Diapers & Supplies MISC Please provide patient with adult diapers and pads 06/03/16   Ophelia Shoulder, MD  erythromycin ophthalmic ointment Place a 1/2 inch ribbon of ointment into the lower eyelid every 6 hours. 01/10/17   Charlesetta Shanks, MD  Oxycodone HCl 20 MG TABS Take 1 tablet (20 mg total) by mouth 3 (three) times daily as needed. Please refill 30 days after the first prescription. 10/30/16   Lorella Nimrod, MD  Oxycodone HCl 20 MG TABS Take 1 tablet (20 mg total) by mouth 3 (three) times daily as needed. 10/30/16   Lorella Nimrod, MD    Family History Family History  Problem Relation Age of Onset  . Diabetes Mother   . Stroke Maternal Aunt     Social History Social History   Tobacco Use  . Smoking status: Never Smoker  . Smokeless tobacco: Never Used  Substance Use Topics  . Alcohol use: No  . Drug use: No     Allergies   Penicillins   Review of Systems Review of Systems 10 Systems reviewed and are negative for acute change except as noted in the HPI.   Physical Exam Updated Vital Signs BP 112/70   Pulse 76   Temp 98.8 F (37.1 C) (Oral)   Resp 18   SpO2 99%   Physical Exam  Constitutional:  Patient is deconditioned but alert and interactive.  Nontoxic.  No respiratory distress.  HENT:  Mouth/Throat: Oropharynx is clear and moist.  Left eye has small amount of greenish yellow drainage at the medial and lateral canthus.  No general swelling or edema.  Extraocular motions normal.  Eyes: EOM are normal. Pupils are equal, round, and reactive to light.  Neck: Neck supple.  Cardiovascular: Normal rate, regular  rhythm, normal heart sounds and intact distal pulses.  Pulmonary/Chest: Effort normal and breath sounds normal.  Abdominal: Soft.  Patient has very mild suprapubic discomfort to deep palpation.  This is after Foley catheter changed.  Musculoskeletal: She exhibits edema. She exhibits no tenderness.  Neurological: She is alert.  Patient is alert and pleasant.  He does exhibit some signs of dementia but is conversant and answers questions.  She assists in following commands for exam.  Skin: Skin is warm and dry.  Psychiatric: She has a normal mood and affect.     ED Treatments / Results  Labs (all labs ordered are listed, but only abnormal results are displayed) Labs Reviewed  URINALYSIS, ROUTINE W REFLEX MICROSCOPIC -  Abnormal; Notable for the following components:      Result Value   APPearance CLOUDY (*)    Hgb urine dipstick SMALL (*)    Protein, ur 30 (*)    Leukocytes, UA LARGE (*)    Bacteria, UA MANY (*)    Squamous Epithelial / LPF 0-5 (*)    All other components within normal limits  COMPREHENSIVE METABOLIC PANEL - Abnormal; Notable for the following components:   Chloride 116 (*)    CO2 16 (*)    Glucose, Bld 176 (*)    BUN 54 (*)    Creatinine, Ser 2.22 (*)    AST 14 (*)    ALT 8 (*)    GFR calc non Af Amer 22 (*)    GFR calc Af Amer 26 (*)    All other components within normal limits  CBC WITH DIFFERENTIAL/PLATELET - Abnormal; Notable for the following components:   WBC 12.2 (*)    RBC 2.92 (*)    Hemoglobin 9.4 (*)    HCT 27.5 (*)    RDW 15.9 (*)    Neutro Abs 9.1 (*)    All other components within normal limits  CBG MONITORING, ED - Abnormal; Notable for the following components:   Glucose-Capillary 135 (*)    All other components within normal limits  I-STAT CG4 LACTIC ACID, ED - Abnormal; Notable for the following components:   Lactic Acid, Venous 2.71 (*)    All other components within normal limits  URINE CULTURE  LIPASE, BLOOD  LIPASE, BLOOD    COMPREHENSIVE METABOLIC PANEL  CBC  I-STAT CG4 LACTIC ACID, ED    EKG  EKG Interpretation None       Radiology No results found.  Procedures Procedures (including critical care time)  Medications Ordered in ED Medications  cefTRIAXone (ROCEPHIN) 1 g in dextrose 5 % 50 mL IVPB (1 g Intravenous New Bag/Given 01/10/17 0023)  erythromycin ophthalmic ointment (not administered)  0.9 %  sodium chloride infusion ( Intravenous New Bag/Given 01/09/17 2221)     Initial Impression / Assessment and Plan / ED Course  I have reviewed the triage vital signs and the nursing notes.  Pertinent labs & imaging results that were available during my care of the patient were reviewed by me and considered in my medical decision making (see chart for details).      Final Clinical Impressions(s) / ED Diagnoses   Final diagnoses:  Problem with Foley catheter, initial encounter ([place])  Acute cystitis without hematuria  Acute bacterial conjunctivitis of left eye  Felt much better once her catheter was changed.  Her niece who is her primary caregiver reports that she was having discharge around it and it had a very bad smell and seemed painful to the patient.  She has not had a fever.  Patient does not show signs of being toxic.  She is alert and interactive.  At this time will initiate antibiotics based on urinalysis and patient having symptomatic suprapubic discomfort with signs of purulent drainage on and in the old catheter.  Patient is not having vomiting or fever.  Patient's niece feels comfortable continuing for her care at home.  Plan will be for return to home after IV Rocephin and fluids.  ED Discharge Orders        Ordered    cephALEXin (KEFLEX) 500 MG capsule  2 times daily     01/10/17 0026    erythromycin ophthalmic ointment     01/10/17 0026  Charlesetta Shanks, MD 01/10/17 [phone]

## 2017-01-09 NOTE — ED Notes (Signed)
Bed: UN27 Expected date:  Expected time:  Means of arrival:  Comments: EMS 66 yo female abdominal pain/chronic

## 2017-01-10 DIAGNOSIS — H1010 Acute atopic conjunctivitis, unspecified eye: Secondary | ICD-10-CM | POA: Diagnosis not present

## 2017-01-10 DIAGNOSIS — T83091A Other mechanical complication of indwelling urethral catheter, initial encounter: Secondary | ICD-10-CM | POA: Diagnosis not present

## 2017-01-10 DIAGNOSIS — T83098A Other mechanical complication of other indwelling urethral catheter, initial encounter: Secondary | ICD-10-CM | POA: Diagnosis not present

## 2017-01-10 DIAGNOSIS — B999 Unspecified infectious disease: Secondary | ICD-10-CM | POA: Diagnosis not present

## 2017-01-10 MED ORDER — DEXTROSE 5 % IV SOLN
1.0000 g | Freq: Once | INTRAVENOUS | Status: AC
  Administered 2017-01-10: 1 g via INTRAVENOUS
  Filled 2017-01-10: qty 10

## 2017-01-10 MED ORDER — ERYTHROMYCIN 5 MG/GM OP OINT
TOPICAL_OINTMENT | Freq: Four times a day (QID) | OPHTHALMIC | Status: DC
  Administered 2017-01-10: 1 via OPHTHALMIC
  Filled 2017-01-10: qty 3.5

## 2017-01-10 MED ORDER — ERYTHROMYCIN 5 MG/GM OP OINT: TOPICAL_OINTMENT | OPHTHALMIC | 1 refills | Status: DC

## 2017-01-10 MED ORDER — CEPHALEXIN 500 MG PO CAPS: 1000.0000 mg | ORAL_CAPSULE | Freq: Two times a day (BID) | ORAL | 0 refills | Status: DC

## 2017-01-10 NOTE — ED Notes (Signed)
Bed: [facility] Expected date:  Expected time:  Means of arrival:  Comments: 

## 2017-01-11 ENCOUNTER — Encounter: Payer: Self-pay | Admitting: Dietician

## 2017-01-11 ENCOUNTER — Telehealth: Payer: Self-pay | Admitting: Dietician

## 2017-01-11 NOTE — Telephone Encounter (Signed)
A gentleman left a message on voicemail requesting a [employer] loss adjuster for Michelle Holmes behalf, leaving her date of birth and a call back number.  Will route request to her primary care provider, Dr. [name]. Here are Medicare Coverage criteria for Continuous glucose monitoring:  1. This patient has diabetes 2. This patient has been using a blood glucose monitor and performing frequent (4 or more times a day) testing 3. This patient is insulin-treated with multiple (3 or more) daily injections of insulin or a Medicare covered continuous subcutaneous insulin infusion (CSII) pump 4. This patient's inulin treatment regimen requires frequent adjustment by the beneficiary on the basis of blood glucose or continuous glucose monitoring testing results 5. This patient has had an- in-person visit with myself (the treating practitioner) to evaluate their diabetes control and determined that the above criteria have been met. 6.  This patient agrees to follow up every six months for an in-person visit to assess adherence to the continuous glucose monitoring regimen and diabetes treatment plan.

## 2017-01-11 NOTE — Telephone Encounter (Signed)
Her last A1c was 5.7, her caregiver was given her Lantus 5-10 units on days when her blood sugar goes above 200 which is may be once a week or less.  She is currently not using any insulin and does not required frequent monitoring. I do not think she qualifies for continuous glucose monitoring.

## 2017-01-12 LAB — URINE CULTURE: Culture: >=100000 — AB

## 2017-01-12 NOTE — Telephone Encounter (Addendum)
Returned call and explained Dr. Latina Craver response. Family verbalized understanding saying they want to minimize as much as possible the invasiveness of Ms. Mulkern care.

## 2017-01-13 ENCOUNTER — Telehealth: Payer: Self-pay | Admitting: *Deleted

## 2017-01-13 NOTE — Telephone Encounter (Signed)
Post ED Visit - Positive Culture Follow-up  Culture report reviewed by antimicrobial stewardship pharmacist:  [x]  Elenor Quinones, Pharm.D. []  Heide Guile, Pharm.D., BCPS AQ-ID []  [place] Neptune, Pharm.D., BCPS []  Alycia Rossetti, Pharm.D., BCPS []  [place], Pharm.D., BCPS, AAHIVP []  Legrand Como, Pharm.D., BCPS, AAHIVP []  Salome Arnt, PharmD, BCPS []  Dimitri Ped, PharmD, BCPS []  Vincenza Hews, PharmD, BCPS  Positive urine culture Treated with Cephalexin, organism sensitive to the same and no further patient follow-up is required at this time.  [name] [facility] 01/13/2017, 9:29 AM

## 2017-01-13 NOTE — Telephone Encounter (Signed)
Call from [place] from [facility] - requesting verbal orders for "Skilled nursing visits 1 times every other week x 9 weeks plus 2 additional visits as needed; monthly foley catheter changes;medication education; monitoring s/s of infection and for any catheter complications; and monitoring of any new wounds that may occur". VO given - if not acceptable let me know.

## 2017-01-14 NOTE — Telephone Encounter (Signed)
Thanks

## 2017-01-21 DIAGNOSIS — J449 Chronic obstructive pulmonary disease, unspecified: Secondary | ICD-10-CM | POA: Diagnosis not present

## 2017-01-26 DIAGNOSIS — J439 Emphysema, unspecified: Secondary | ICD-10-CM | POA: Diagnosis not present

## 2017-01-26 DIAGNOSIS — I5032 Chronic diastolic (congestive) heart failure: Secondary | ICD-10-CM | POA: Diagnosis not present

## 2017-01-26 DIAGNOSIS — N184 Chronic kidney disease, stage 4 (severe): Secondary | ICD-10-CM | POA: Diagnosis not present

## 2017-01-26 DIAGNOSIS — Z87440 Personal history of urinary (tract) infections: Secondary | ICD-10-CM | POA: Diagnosis not present

## 2017-01-26 DIAGNOSIS — E1122 Type 2 diabetes mellitus with diabetic chronic kidney disease: Secondary | ICD-10-CM | POA: Diagnosis not present

## 2017-01-26 DIAGNOSIS — Z86718 Personal history of other venous thrombosis and embolism: Secondary | ICD-10-CM | POA: Diagnosis not present

## 2017-01-26 DIAGNOSIS — I69320 Aphasia following cerebral infarction: Secondary | ICD-10-CM | POA: Diagnosis not present

## 2017-01-26 DIAGNOSIS — I69351 Hemiplegia and hemiparesis following cerebral infarction affecting right dominant side: Secondary | ICD-10-CM | POA: Diagnosis not present

## 2017-01-26 DIAGNOSIS — N132 Hydronephrosis with renal and ureteral calculous obstruction: Secondary | ICD-10-CM | POA: Diagnosis not present

## 2017-01-26 DIAGNOSIS — E43 Unspecified severe protein-calorie malnutrition: Secondary | ICD-10-CM | POA: Diagnosis not present

## 2017-01-26 DIAGNOSIS — M1990 Unspecified osteoarthritis, unspecified site: Secondary | ICD-10-CM | POA: Diagnosis not present

## 2017-01-26 DIAGNOSIS — I2510 Atherosclerotic heart disease of native coronary artery without angina pectoris: Secondary | ICD-10-CM | POA: Diagnosis not present

## 2017-01-26 DIAGNOSIS — I130 Hypertensive heart and chronic kidney disease with heart failure and stage 1 through stage 4 chronic kidney disease, or unspecified chronic kidney disease: Secondary | ICD-10-CM | POA: Diagnosis not present

## 2017-01-26 DIAGNOSIS — Z466 Encounter for fitting and adjustment of urinary device: Secondary | ICD-10-CM | POA: Diagnosis not present

## 2017-01-26 DIAGNOSIS — I69391 Dysphagia following cerebral infarction: Secondary | ICD-10-CM | POA: Diagnosis not present

## 2017-01-26 DIAGNOSIS — D631 Anemia in chronic kidney disease: Secondary | ICD-10-CM | POA: Diagnosis not present

## 2017-01-26 DIAGNOSIS — R1310 Dysphagia, unspecified: Secondary | ICD-10-CM | POA: Diagnosis not present

## 2017-01-26 DIAGNOSIS — G4733 Obstructive sleep apnea (adult) (pediatric): Secondary | ICD-10-CM | POA: Diagnosis not present

## 2017-01-26 DIAGNOSIS — Z9181 History of falling: Secondary | ICD-10-CM | POA: Diagnosis not present

## 2017-02-05 ENCOUNTER — Encounter: Payer: Self-pay | Admitting: Internal Medicine

## 2017-02-05 ENCOUNTER — Other Ambulatory Visit: Payer: Self-pay

## 2017-02-05 ENCOUNTER — Ambulatory Visit (INDEPENDENT_AMBULATORY_CARE_PROVIDER_SITE_OTHER): Payer: Medicare Other | Admitting: Internal Medicine

## 2017-02-05 VITALS — BP 161/86 | HR 83 | Temp 98.3°F

## 2017-02-05 DIAGNOSIS — Z79891 Long term (current) use of opiate analgesic: Secondary | ICD-10-CM

## 2017-02-05 DIAGNOSIS — M62421 Contracture of muscle, right upper arm: Secondary | ICD-10-CM | POA: Diagnosis not present

## 2017-02-05 DIAGNOSIS — Z794 Long term (current) use of insulin: Secondary | ICD-10-CM

## 2017-02-05 DIAGNOSIS — M549 Dorsalgia, unspecified: Secondary | ICD-10-CM | POA: Diagnosis not present

## 2017-02-05 DIAGNOSIS — E119 Type 2 diabetes mellitus without complications: Secondary | ICD-10-CM

## 2017-02-05 DIAGNOSIS — I10 Essential (primary) hypertension: Secondary | ICD-10-CM | POA: Diagnosis not present

## 2017-02-05 DIAGNOSIS — E118 Type 2 diabetes mellitus with unspecified complications: Secondary | ICD-10-CM

## 2017-02-05 MED ORDER — OXYCODONE HCL 20 MG PO TABS
20.0000 mg | ORAL_TABLET | Freq: Three times a day (TID) | ORAL | 0 refills | Status: DC | PRN
Start: 2017-02-05 — End: 2017-03-05

## 2017-02-05 MED ORDER — AMLODIPINE BESYLATE 5 MG PO TABS: 5.0000 mg | ORAL_TABLET | Freq: Every day | ORAL | 3 refills | Status: DC

## 2017-02-05 NOTE — Patient Instructions (Signed)
FOLLOW-UP INSTRUCTIONS When: 1 month For: Check up with Dr. [name] What to bring: Medicines   Nice to meet you today Ms. Besecker.  Who is going to provide a one-month refill of her pain medicine.  Be sure that you are taking this only when he needed up to 3 times a day instead of taking it 3 times a day no matter what.  Also be sure to keep this medication safe to make sure that you are the only one who is receiving it.  We did another urine test today.  For your blood pressure we will restart a medicine called amlodipine which was the last one we see that you were taking and has a few side effects.  We will put in an appointment with Dr. [name] in 1 month for a checkup and refills.

## 2017-02-05 NOTE — Progress Notes (Signed)
   CC: F/u of chronic opiate use   HPI:  [name] is a 67 y.o. female with a past medical history as described below who presents to the clinic for follow-up of her chronic medical conditions and chronic opiate use.  Chronic Opiate Use: She is currently on oxycodone 20 mg 3 times a day as needed.  However, at her last visit her urine tox screen was negative for oxycodone despite reporting last dose the day of appointment.  The patient and caregiver present today are unable to reliably report her average number of doses per day.  The family member states that he checks in on her in the mornings and nursing assistance care for her in the afternoon and evening.  They try to keep her pain medication in a separate place from her other medicines.  Today, they report last dose of pain medication was yesterday and she is currently in pain as a result.  DM: She has a history of diabetes which has been well controlled and she is currently using Lantus 10 units only one blood sugars are above 200 which occurs infrequently.  No reports of hypoglycemia.  HTN: In the past, she discontinued taking all medications apart from intermittent insulin and pain medicine.  Her blood pressures have been running higher at home and one of her daughters has recently convinced her to restart a blood pressure medicine which she is open to today as well.  Past Medical History:  Diagnosis Date  . Anemia   . CHF (congestive heart failure) ([place])   . Chronic back pain   . COPD (chronic obstructive pulmonary disease) ([place])   . Coronary artery disease   . Diabetes mellitus   . DJD (degenerative joint disease)   . Emphysema   . Hypercholesteremia   . Hypertension   . Myocardial infarction ([place])   . Obesity   . Obesity hypoventilation syndrome ([place])   . Renal insufficiency   . Scoliosis   . Sleep apnea   . Stroke ([place])   . TIA (transient ischemic attack)    Review of Systems:  Review of Systems  Constitutional:  Negative for fever.  Genitourinary: Negative for dysuria.  Musculoskeletal: Positive for back pain and joint pain.     Physical Exam:  Vitals:   02/05/17 1352  BP: (!) 161/86  Pulse: 83  Temp: 98.3 F (36.8 C)  TempSrc: Oral  SpO2: 100%   General: Chronically ill appearing woman in wheelchair, no acute distress HEENT: Normal conjuctiva CV: RRR Resp: Clear anterior breath sounds, normal work of breathing, no distress  Abd: Soft, +BS, non-tender to palpation Extr: No LE edema, mild TTP to RUE  Neuro: Alert, chronic contracture/spastic posture to RUE Skin: Warm, dry      Assessment & Plan:   See Encounters Tab for problem based charting.  Patient discussed with Dr. Angelia Mould

## 2017-02-06 NOTE — Assessment & Plan Note (Signed)
She recently had unexpected absence of oxycodone on her urine tox screen.  The present family member and the patient are unable to confidently report the amount of doses she receives.  With the current rate of use it appears the medications being used 3 times daily scheduled rather than as needed.  There is also the possibility of diversion with multiple caregivers and CNA's assisting the patient with medications.  Today, they were counseled on the importance of using the oxycodone when she is in pain and needs the medication and holding a dose if she is comfortable, as well as keeping track of the doses she receives in accounting for the medication and keeping the medication safe.  Database was also reviewed with no red flags in the filling pattern.  We will repeat urine toxicology and will prescribe a one-month supply of her oxycodone and have her follow-up for ongoing discussion and monitoring.  --Oxycodone 20 mg TID PRN #90 paper prescription  --Urine toxassure  --F/u in 1 month with PCP

## 2017-02-06 NOTE — Assessment & Plan Note (Signed)
Blood pressure today 161/86, and per family report, have also been running high when checking at home.  The patient is open to restarting a blood pressure medication today.  On chart review, it appears her last antihypertensive was amlodipine 5 mg.  We will restart this today.  --Amlodipine 5 mg daily

## 2017-02-06 NOTE — Assessment & Plan Note (Signed)
she has little requirement for insulin and has been continuing to use 10 units Lantus intermittently with elevated blood sugars.  Per her last clinic note, this is likely not necessary and does not need to be refilled, though they will continue this regimen at home with their current insulin supply.  Her last several hemoglobin A1c values have been less than 6 and we will defer checking today in favor of checking every 6 months.

## 2017-02-08 NOTE — Progress Notes (Signed)
Internal Medicine Clinic Attending  Case discussed with Dr. Harden at the time of the visit.  We reviewed the resident's history and exam and pertinent patient test results.  I agree with the assessment, diagnosis, and plan of care documented in the resident's note.  

## 2017-02-10 DIAGNOSIS — I2510 Atherosclerotic heart disease of native coronary artery without angina pectoris: Secondary | ICD-10-CM | POA: Diagnosis not present

## 2017-02-10 DIAGNOSIS — Z466 Encounter for fitting and adjustment of urinary device: Secondary | ICD-10-CM | POA: Diagnosis not present

## 2017-02-10 DIAGNOSIS — J439 Emphysema, unspecified: Secondary | ICD-10-CM | POA: Diagnosis not present

## 2017-02-10 DIAGNOSIS — I5032 Chronic diastolic (congestive) heart failure: Secondary | ICD-10-CM | POA: Diagnosis not present

## 2017-02-10 DIAGNOSIS — G4733 Obstructive sleep apnea (adult) (pediatric): Secondary | ICD-10-CM | POA: Diagnosis not present

## 2017-02-10 DIAGNOSIS — N184 Chronic kidney disease, stage 4 (severe): Secondary | ICD-10-CM | POA: Diagnosis not present

## 2017-02-10 DIAGNOSIS — E1122 Type 2 diabetes mellitus with diabetic chronic kidney disease: Secondary | ICD-10-CM | POA: Diagnosis not present

## 2017-02-10 DIAGNOSIS — I130 Hypertensive heart and chronic kidney disease with heart failure and stage 1 through stage 4 chronic kidney disease, or unspecified chronic kidney disease: Secondary | ICD-10-CM | POA: Diagnosis not present

## 2017-02-10 DIAGNOSIS — Z86718 Personal history of other venous thrombosis and embolism: Secondary | ICD-10-CM | POA: Diagnosis not present

## 2017-02-10 DIAGNOSIS — M1990 Unspecified osteoarthritis, unspecified site: Secondary | ICD-10-CM | POA: Diagnosis not present

## 2017-02-10 DIAGNOSIS — I69391 Dysphagia following cerebral infarction: Secondary | ICD-10-CM | POA: Diagnosis not present

## 2017-02-10 DIAGNOSIS — I69351 Hemiplegia and hemiparesis following cerebral infarction affecting right dominant side: Secondary | ICD-10-CM | POA: Diagnosis not present

## 2017-02-10 DIAGNOSIS — Z87440 Personal history of urinary (tract) infections: Secondary | ICD-10-CM | POA: Diagnosis not present

## 2017-02-10 DIAGNOSIS — E43 Unspecified severe protein-calorie malnutrition: Secondary | ICD-10-CM | POA: Diagnosis not present

## 2017-02-10 DIAGNOSIS — D631 Anemia in chronic kidney disease: Secondary | ICD-10-CM | POA: Diagnosis not present

## 2017-02-10 DIAGNOSIS — N132 Hydronephrosis with renal and ureteral calculous obstruction: Secondary | ICD-10-CM | POA: Diagnosis not present

## 2017-02-10 DIAGNOSIS — I69320 Aphasia following cerebral infarction: Secondary | ICD-10-CM | POA: Diagnosis not present

## 2017-02-10 DIAGNOSIS — R1310 Dysphagia, unspecified: Secondary | ICD-10-CM | POA: Diagnosis not present

## 2017-02-10 DIAGNOSIS — Z9181 History of falling: Secondary | ICD-10-CM | POA: Diagnosis not present

## 2017-02-12 LAB — TOXASSURE SELECT,+ANTIDEPR,UR

## 2017-02-17 DIAGNOSIS — N184 Chronic kidney disease, stage 4 (severe): Secondary | ICD-10-CM | POA: Diagnosis not present

## 2017-02-17 DIAGNOSIS — I2510 Atherosclerotic heart disease of native coronary artery without angina pectoris: Secondary | ICD-10-CM | POA: Diagnosis not present

## 2017-02-17 DIAGNOSIS — Z86718 Personal history of other venous thrombosis and embolism: Secondary | ICD-10-CM | POA: Diagnosis not present

## 2017-02-17 DIAGNOSIS — I69320 Aphasia following cerebral infarction: Secondary | ICD-10-CM | POA: Diagnosis not present

## 2017-02-17 DIAGNOSIS — G4733 Obstructive sleep apnea (adult) (pediatric): Secondary | ICD-10-CM | POA: Diagnosis not present

## 2017-02-17 DIAGNOSIS — I69391 Dysphagia following cerebral infarction: Secondary | ICD-10-CM | POA: Diagnosis not present

## 2017-02-17 DIAGNOSIS — M1990 Unspecified osteoarthritis, unspecified site: Secondary | ICD-10-CM | POA: Diagnosis not present

## 2017-02-17 DIAGNOSIS — D631 Anemia in chronic kidney disease: Secondary | ICD-10-CM | POA: Diagnosis not present

## 2017-02-17 DIAGNOSIS — I130 Hypertensive heart and chronic kidney disease with heart failure and stage 1 through stage 4 chronic kidney disease, or unspecified chronic kidney disease: Secondary | ICD-10-CM | POA: Diagnosis not present

## 2017-02-17 DIAGNOSIS — E43 Unspecified severe protein-calorie malnutrition: Secondary | ICD-10-CM | POA: Diagnosis not present

## 2017-02-17 DIAGNOSIS — J439 Emphysema, unspecified: Secondary | ICD-10-CM | POA: Diagnosis not present

## 2017-02-17 DIAGNOSIS — R1310 Dysphagia, unspecified: Secondary | ICD-10-CM | POA: Diagnosis not present

## 2017-02-17 DIAGNOSIS — I69351 Hemiplegia and hemiparesis following cerebral infarction affecting right dominant side: Secondary | ICD-10-CM | POA: Diagnosis not present

## 2017-02-17 DIAGNOSIS — Z87440 Personal history of urinary (tract) infections: Secondary | ICD-10-CM | POA: Diagnosis not present

## 2017-02-17 DIAGNOSIS — I5032 Chronic diastolic (congestive) heart failure: Secondary | ICD-10-CM | POA: Diagnosis not present

## 2017-02-17 DIAGNOSIS — Z466 Encounter for fitting and adjustment of urinary device: Secondary | ICD-10-CM | POA: Diagnosis not present

## 2017-02-17 DIAGNOSIS — N132 Hydronephrosis with renal and ureteral calculous obstruction: Secondary | ICD-10-CM | POA: Diagnosis not present

## 2017-02-17 DIAGNOSIS — Z9181 History of falling: Secondary | ICD-10-CM | POA: Diagnosis not present

## 2017-02-17 DIAGNOSIS — E1122 Type 2 diabetes mellitus with diabetic chronic kidney disease: Secondary | ICD-10-CM | POA: Diagnosis not present

## 2017-02-21 DIAGNOSIS — J449 Chronic obstructive pulmonary disease, unspecified: Secondary | ICD-10-CM | POA: Diagnosis not present

## 2017-02-22 DIAGNOSIS — Z466 Encounter for fitting and adjustment of urinary device: Secondary | ICD-10-CM | POA: Diagnosis not present

## 2017-02-24 DIAGNOSIS — Z466 Encounter for fitting and adjustment of urinary device: Secondary | ICD-10-CM | POA: Diagnosis not present

## 2017-03-05 ENCOUNTER — Other Ambulatory Visit: Payer: Self-pay

## 2017-03-05 ENCOUNTER — Encounter: Payer: Self-pay | Admitting: Internal Medicine

## 2017-03-05 ENCOUNTER — Ambulatory Visit (INDEPENDENT_AMBULATORY_CARE_PROVIDER_SITE_OTHER): Payer: Medicare Other | Admitting: Internal Medicine

## 2017-03-05 VITALS — BP 147/77 | HR 74 | Temp 98.0°F | Ht 62.0 in | Wt 167.0 lb

## 2017-03-05 DIAGNOSIS — Z794 Long term (current) use of insulin: Secondary | ICD-10-CM | POA: Diagnosis not present

## 2017-03-05 DIAGNOSIS — Z79891 Long term (current) use of opiate analgesic: Secondary | ICD-10-CM

## 2017-03-05 DIAGNOSIS — G8191 Hemiplegia, unspecified affecting right dominant side: Secondary | ICD-10-CM

## 2017-03-05 DIAGNOSIS — G8111 Spastic hemiplegia affecting right dominant side: Secondary | ICD-10-CM

## 2017-03-05 DIAGNOSIS — Z79899 Other long term (current) drug therapy: Secondary | ICD-10-CM

## 2017-03-05 DIAGNOSIS — I110 Hypertensive heart disease with heart failure: Secondary | ICD-10-CM

## 2017-03-05 DIAGNOSIS — N184 Chronic kidney disease, stage 4 (severe): Secondary | ICD-10-CM | POA: Diagnosis not present

## 2017-03-05 DIAGNOSIS — Z993 Dependence on wheelchair: Secondary | ICD-10-CM | POA: Diagnosis not present

## 2017-03-05 DIAGNOSIS — E118 Type 2 diabetes mellitus with unspecified complications: Secondary | ICD-10-CM | POA: Diagnosis not present

## 2017-03-05 DIAGNOSIS — I5032 Chronic diastolic (congestive) heart failure: Secondary | ICD-10-CM

## 2017-03-05 DIAGNOSIS — M62421 Contracture of muscle, right upper arm: Secondary | ICD-10-CM

## 2017-03-05 DIAGNOSIS — G8929 Other chronic pain: Secondary | ICD-10-CM

## 2017-03-05 DIAGNOSIS — I10 Essential (primary) hypertension: Secondary | ICD-10-CM

## 2017-03-05 LAB — GLUCOSE, CAPILLARY: Glucose-Capillary: 98 mg/dL (ref 65–99)

## 2017-03-05 MED ORDER — AMLODIPINE BESYLATE 10 MG PO TABS: 10.0000 mg | ORAL_TABLET | Freq: Every day | ORAL | 3 refills | Status: DC

## 2017-03-05 MED ORDER — OXYCODONE HCL 20 MG PO TABS: 20.0000 mg | ORAL_TABLET | Freq: Three times a day (TID) | ORAL | 0 refills | Status: DC | PRN

## 2017-03-05 NOTE — Progress Notes (Addendum)
   CC: For F/U of her Hypertension and chronic pain  HPI:  [name] is a 67 y.o. 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  She was feeling better and has no complaints today.  Her pain is well controlled with oxycodone 20 mg 3 times a day.  Last dose was this morning.  Please see assessment and plan for her chronic conditions.  Addendum.  Her BMP was consistent with dehydration with increased in BUN and creatinine as compared to her baseline.  Vitamin D level at 7, calcium and parathyroid within normal limit. Called the patient and talk with her caregiver about encouraging her to drink more fluids, and also sending a prescription of vitamin D 50,000 units/week for 6 weeks to her pharmacy.  Past Medical History:  Diagnosis Date  . Anemia   . CHF (congestive heart failure) ([place])   . Chronic back pain   . COPD (chronic obstructive pulmonary disease) ([place])   . Coronary artery disease   . Diabetes mellitus   . DJD (degenerative joint disease)   . Emphysema   . Hypercholesteremia   . Hypertension   . Myocardial infarction ([place])   . Obesity   . Obesity hypoventilation syndrome ([place])   . Renal insufficiency   . Scoliosis   . Sleep apnea   . Stroke ([place])   . TIA (transient ischemic attack)    Review of Systems: Negative except mentioned in HPI.  Physical Exam:  Vitals:   03/05/17 1320  BP: (!) 147/77  Pulse: 74  Temp: 98 F (36.7 C)  TempSrc: Oral  SpO2: 100%  Weight: 167 lb (75.8 kg)  Height: 5\' 2"  (1.575 m)    General: Vital signs reviewed.  Patient is well-developed and well-nourished, wheelchair-bound, in no acute distress and cooperative with exam.  Head: Normocephalic and atraumatic. Eyes: EOMI, conjunctivae normal, no scleral icterus.  Cardiovascular: RRR, S1 normal, S2 normal, no murmurs, gallops, or rubs. Pulmonary/Chest: Clear to auscultation  bilaterally, no wheezes, rales, or rhonchi. Abdominal: Soft, non-tender, non-distended, BS +, no masses, organomegaly, or guarding present.  Extremities: No lower extremity edema bilaterally,  pulses symmetric and intact bilaterally. No cyanosis or clubbing. Neurological: A&O x3, with chronic contractures of right upper extremity. Skin: Warm, dry and intact. No rashes or erythema.  Dry skin on lower extremities. Psychiatric: Normal mood and affect. speech and behavior is normal. Cognition and memory are normal.  Assessment & Plan:   See Encounters Tab for problem based charting.  Patient discussed with Dr. Angelia Mould.

## 2017-03-05 NOTE — Assessment & Plan Note (Signed)
BP Readings from Last 3 Encounters:  03/05/17 (!) 147/77  02/05/17 (!) 161/86  01/10/17 123/79   Her blood pressure seems improving, although still elevated. Patient does not follow low-salt diet, according to caregiver she is eating [employer] daily.  We have a long discussion but patient do not want to give up her [employer] at this time.  Amlodipine dose was increased to 10 mg daily. She might get benefit with the addition of Lasix in the future if she agrees to take it, currently her mucous membranes and skin appears dry. She was advised to use some skin emollient and drink more fluid to keep herself well-hydrated.

## 2017-03-05 NOTE — Assessment & Plan Note (Signed)
She continued to experience pain due to spastic right hemiplegia if she does not take her oxycodone.  According to her her pain is well controlled on oxycodone 3 times a day. Her urine toxicology done during previous clinic visit was appropriate, before that it was negative for oxycodone which raises a suspicion of diversion.  She always accompanied with a caregiver.  -She was given a prescription of oxycodone 20 mg 3 times daily with 90 tablets. -We will continue doing urine tox at random.

## 2017-03-05 NOTE — Patient Instructions (Addendum)
Thank you for visiting clinic today. I am increasing the dose of your blood pressure medicine, as we discussed you have to decrease your salt.  Please try back on your fast food. Skin appears dry-please consider changing your soap to mild moisturizing soap and use some lotions or oil to keep your skin moist. Please follow-up in 4 weeks for your blood pressure check.

## 2017-03-05 NOTE — Assessment & Plan Note (Signed)
She had blood sugar between 98-165 during the past month, never used any Lantus.  They can continue giving her Lantus if her blood sugar goes above 200 as they were doing it before.

## 2017-03-05 NOTE — Assessment & Plan Note (Signed)
Patient denies any dyspnea or swelling. She is not on any medicine refused to take any other medicine except oxycodone.

## 2017-03-06 LAB — BMP8+ANION GAP
Anion Gap: 18 mmol/L (ref 10.0–18.0)
BUN/Creatinine Ratio: 22 (ref 12–28)
BUN: 58 mg/dL — ABNORMAL HIGH (ref 8–27)
CO2: 16 mmol/L — ABNORMAL LOW (ref 20–29)
Calcium: 9.5 mg/dL (ref 8.7–10.3)
Chloride: 112 mmol/L — ABNORMAL HIGH (ref 96–106)
Creatinine, Ser: 2.63 mg/dL — ABNORMAL HIGH (ref 0.57–1.00)
GFR calc Af Amer: 21 mL/min/{1.73_m2} — ABNORMAL LOW (ref >59)
GFR calc non Af Amer: 18 mL/min/{1.73_m2} — ABNORMAL LOW (ref >59)
Glucose: 89 mg/dL (ref 65–99)
Potassium: 4.2 mmol/L (ref 3.5–5.2)
Sodium: 146 mmol/L — ABNORMAL HIGH (ref 134–144)

## 2017-03-06 LAB — PTH, INTACT AND CALCIUM
Calcium: 9.5 mg/dL (ref 8.7–10.3)
PTH: 45 pg/mL (ref 15–65)

## 2017-03-06 LAB — VITAMIN D 25 HYDROXY (VIT D DEFICIENCY, FRACTURES): Vit D, 25-Hydroxy: 7 ng/mL — ABNORMAL LOW (ref 30.0–100.0)

## 2017-03-06 MED ORDER — VITAMIN D (ERGOCALCIFEROL) 1.25 MG (50000 UNIT) PO CAPS: 50000.0000 [IU] | ORAL_CAPSULE | ORAL | 0 refills | Status: DC

## 2017-03-06 NOTE — Addendum Note (Signed)
Addended by: Lorella Nimrod on: 03/06/2017 12:27 PM   Modules accepted: Orders

## 2017-03-06 NOTE — Progress Notes (Signed)
Internal Medicine Clinic Attending  Case discussed with Dr. Amin at the time of the visit.  We reviewed the resident's history and exam and pertinent patient test results.  I agree with the assessment, diagnosis, and plan of care documented in the resident's note.    

## 2017-03-24 DIAGNOSIS — J449 Chronic obstructive pulmonary disease, unspecified: Secondary | ICD-10-CM | POA: Diagnosis not present

## 2017-03-27 ENCOUNTER — Telehealth: Payer: Self-pay | Admitting: Internal Medicine

## 2017-03-27 NOTE — Telephone Encounter (Signed)
   Reason for call:   I received a call from [name] daughter who is caretaker, at 5:20 PM indicating Ms. Ekholm's urine was purple.   Pertinent Data:   Caretaker states urine turned purple color today only. She notes a few days of discharge in the bag as well. She states patient is eating well, interacting normally as before, had not felt hot or had systemic symptoms.  Patient used to have foley changed every month, however last time was Jan. She has contacted [facility] however they state they need new order. She called urology on Wednesday, however has not heard back.  From chart review, it appears Dr. [name] has ordered [facility] for foley care last time (Nov).    Assessment / Plan / Recommendations:   As patient stable, no indication for urgent evaluation - patient needs significant assistance as she is bedbound.  I advised care taker to continue monitoring for s/s of infection; I am unsure what could be causing urine discoloration to purple, possibly food intake?  Advised to f/u in clinic or urology early this week if concerns about foley prior to [facility] getting there.  Will forward to clinic staff so [facility] order can be placed; as she is not my panel patient and medicare, I have not had a Face to Face encounter with the patient to place order.   As always, pt is advised that if symptoms worsen or new symptoms arise, they should go to an urgent care facility or to to ER for further evaluation.   Alphonzo Grieve, MD   03/27/2017, 5:20 PM

## 2017-03-28 ENCOUNTER — Encounter (HOSPITAL_COMMUNITY): Payer: Self-pay

## 2017-03-28 ENCOUNTER — Other Ambulatory Visit: Payer: Self-pay

## 2017-03-28 ENCOUNTER — Emergency Department (HOSPITAL_COMMUNITY)
Admission: EM | Admit: 2017-03-28 | Discharge: 2017-03-28 | Disposition: A | Discharge status: Home / Self Care | Payer: Medicare Other | Attending: Emergency Medicine | Admitting: Emergency Medicine

## 2017-03-28 DIAGNOSIS — T83098A Other mechanical complication of other indwelling urethral catheter, initial encounter: Secondary | ICD-10-CM | POA: Diagnosis not present

## 2017-03-28 DIAGNOSIS — R8290 Unspecified abnormal findings in urine: Secondary | ICD-10-CM | POA: Insufficient documentation

## 2017-03-28 DIAGNOSIS — N184 Chronic kidney disease, stage 4 (severe): Secondary | ICD-10-CM | POA: Diagnosis not present

## 2017-03-28 DIAGNOSIS — Z7901 Long term (current) use of anticoagulants: Secondary | ICD-10-CM | POA: Insufficient documentation

## 2017-03-28 DIAGNOSIS — I252 Old myocardial infarction: Secondary | ICD-10-CM | POA: Insufficient documentation

## 2017-03-28 DIAGNOSIS — N3000 Acute cystitis without hematuria: Secondary | ICD-10-CM | POA: Diagnosis not present

## 2017-03-28 DIAGNOSIS — I130 Hypertensive heart and chronic kidney disease with heart failure and stage 1 through stage 4 chronic kidney disease, or unspecified chronic kidney disease: Secondary | ICD-10-CM | POA: Diagnosis not present

## 2017-03-28 DIAGNOSIS — E1122 Type 2 diabetes mellitus with diabetic chronic kidney disease: Secondary | ICD-10-CM | POA: Insufficient documentation

## 2017-03-28 DIAGNOSIS — I5032 Chronic diastolic (congestive) heart failure: Secondary | ICD-10-CM | POA: Insufficient documentation

## 2017-03-28 DIAGNOSIS — Z79899 Other long term (current) drug therapy: Secondary | ICD-10-CM | POA: Diagnosis not present

## 2017-03-28 DIAGNOSIS — I2510 Atherosclerotic heart disease of native coronary artery without angina pectoris: Secondary | ICD-10-CM | POA: Diagnosis not present

## 2017-03-28 DIAGNOSIS — Z978 Presence of other specified devices: Secondary | ICD-10-CM

## 2017-03-28 DIAGNOSIS — J449 Chronic obstructive pulmonary disease, unspecified: Secondary | ICD-10-CM | POA: Insufficient documentation

## 2017-03-28 DIAGNOSIS — Z960 Presence of urogenital implants: Secondary | ICD-10-CM | POA: Insufficient documentation

## 2017-03-28 DIAGNOSIS — Z8673 Personal history of transient ischemic attack (TIA), and cerebral infarction without residual deficits: Secondary | ICD-10-CM | POA: Diagnosis not present

## 2017-03-28 DIAGNOSIS — R030 Elevated blood-pressure reading, without diagnosis of hypertension: Secondary | ICD-10-CM | POA: Diagnosis not present

## 2017-03-28 LAB — CBC WITH DIFFERENTIAL/PLATELET
Basophils Absolute: 0 10*3/uL (ref 0.0–0.1)
Basophils Relative: 0 %
Eosinophils Absolute: 0.2 10*3/uL (ref 0.0–0.7)
Eosinophils Relative: 2 %
HCT: 26.5 % — ABNORMAL LOW (ref 36.0–46.0)
Hemoglobin: 9.1 g/dL — ABNORMAL LOW (ref 12.0–15.0)
Lymphocytes Relative: 24 %
Lymphs Abs: 2.2 10*3/uL (ref 0.7–4.0)
MCH: 35.4 pg — ABNORMAL HIGH (ref 26.0–34.0)
MCHC: 34.3 g/dL (ref 30.0–36.0)
MCV: 103.1 fL — ABNORMAL HIGH (ref 78.0–100.0)
Monocytes Absolute: 0.4 10*3/uL (ref 0.1–1.0)
Monocytes Relative: 4 %
Neutro Abs: 6.5 10*3/uL (ref 1.7–7.7)
Neutrophils Relative %: 70 %
Platelets: 238 10*3/uL (ref 150–400)
RBC: 2.57 MIL/uL — ABNORMAL LOW (ref 3.87–5.11)
RDW: 15.1 % (ref 11.5–15.5)
WBC: 9.3 10*3/uL (ref 4.0–10.5)

## 2017-03-28 LAB — BASIC METABOLIC PANEL
Anion gap: 9 (ref 5–15)
BUN: 49 mg/dL — ABNORMAL HIGH (ref 6–20)
CO2: 19 mmol/L — ABNORMAL LOW (ref 22–32)
Calcium: 9.3 mg/dL (ref 8.9–10.3)
Chloride: 113 mmol/L — ABNORMAL HIGH (ref 101–111)
Creatinine, Ser: 2.19 mg/dL — ABNORMAL HIGH (ref 0.44–1.00)
GFR calc Af Amer: 26 mL/min — ABNORMAL LOW (ref >60)
GFR calc non Af Amer: 22 mL/min — ABNORMAL LOW (ref >60)
Glucose, Bld: 108 mg/dL — ABNORMAL HIGH (ref 65–99)
Potassium: 3.9 mmol/L (ref 3.5–5.1)
Sodium: 141 mmol/L (ref 135–145)

## 2017-03-28 LAB — URINALYSIS, ROUTINE W REFLEX MICROSCOPIC
Bilirubin Urine: NEGATIVE
Glucose, UA: NEGATIVE mg/dL
Hgb urine dipstick: NEGATIVE
Ketones, ur: NEGATIVE mg/dL
Nitrite: NEGATIVE
Protein, ur: 30 mg/dL — AB
Specific Gravity, Urine: 1.012 (ref 1.005–1.030)
pH: 7 (ref 5.0–8.0)

## 2017-03-28 MED ORDER — ACETAMINOPHEN 500 MG PO TABS
1000.0000 mg | ORAL_TABLET | Freq: Once | ORAL | Status: AC
  Administered 2017-03-28: 1000 mg via ORAL
  Filled 2017-03-28: qty 2

## 2017-03-28 MED ORDER — CEPHALEXIN 500 MG PO CAPS
500.0000 mg | ORAL_CAPSULE | Freq: Once | ORAL | Status: AC
Start: 2017-03-28 — End: 2017-03-28
  Administered 2017-03-28: 500 mg via ORAL
  Filled 2017-03-28: qty 1

## 2017-03-28 MED ORDER — CEPHALEXIN 500 MG PO CAPS: 500.0000 mg | ORAL_CAPSULE | Freq: Three times a day (TID) | ORAL | 0 refills | Status: AC

## 2017-03-28 NOTE — ED Provider Notes (Signed)
[facility] DEPT Provider Note  CSN: [id number] Arrival date & time: 03/28/17 1157  Chief Complaint(s) Urinary Tract Infection  HPI Michelle Holmes is a 67 y.o. 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  Patient reports that the pain began earlier today and is been constant since onset.  No alleviating or aggravating factors.  She denies any abdominal pain, fevers, chills, nausea, vomiting.  No diarrhea.  Denies any other physical complaints.  Family is concerned for possible urinary tract infection.  Patient was treated several months ago for urinary tract infection with E. coli that was multidrug resistant but sensitive to cephalosporins.  Last Foley change was approximately 1 month ago.  They report that she is due for Foley change.  HPI  Past Medical History Past Medical History:  Diagnosis Date  . Anemia   . CHF (congestive heart failure) ([place])   . Chronic back pain   . COPD (chronic obstructive pulmonary disease) ([place])   . Coronary artery disease   . Diabetes mellitus   . DJD (degenerative joint disease)   . Emphysema   . Hypercholesteremia   . Hypertension   . Myocardial infarction ([place])   . Obesity   . Obesity hypoventilation syndrome ([place])   . Renal insufficiency   . Scoliosis   . Sleep apnea   . Stroke ([place])   . TIA (transient ischemic attack)    Patient Active Problem List   Diagnosis Date Noted  . Stroke (cerebrum) ([place]) 08/04/2016  . CKD (chronic kidney disease), stage IV ([place]) 08/04/2016  . Kidney stones 08/04/2016  . Long-term current use of opiate analgesic 03/05/2016  . Essential hypertension 03/05/2016  . Chronic diastolic (congestive) heart failure ([place]) 01/28/2016  . Protein-calorie malnutrition, severe 01/28/2016  . Healthcare maintenance   .  Palliative care by specialist   . Anemia in other chronic diseases classified elsewhere   . Acute urinary retention   . Pressure injury of skin 11/24/2015  . Chronic kidney disease (CKD), stage IV (severe) ([place]) 07/06/2015  . 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 Spastic hemiplegia affecting nondominant side ([place]) 07/01/2015  . Obesity, morbid ([place]) 07/01/2015  . Thrombocytopenia ([place])   . Dysarthria   . Lethargy   . HLD (hyperlipidemia)   . Chronic obstructive pulmonary disease ([place])   . Hemiparesis, aphasia, and dysphagia as late effect of cerebrovascular accident (CVA) ([place])   . Right hemiplegia ([place])   . OSA on CPAP 12/08/2010  . Morbid obesity ([place]) 12/08/2010  . CAD (coronary artery disease) 12/08/2010  . Controlled diabetes mellitus type 2 with complications ([place]) 09/47/0962   Home Medication(s) Prior to Admission medications   Medication Sig Start Date End Date Taking? Authorizing Provider  amLODipine (NORVASC) 10 MG tablet Take 1 tablet (10 mg total) by mouth daily. 03/05/17 03/05/18  Lorella Nimrod, MD  apixaban (ELIQUIS) 2.5 MG TABS tablet Take 1 tablet (2.5 mg total) by mouth 2 (two) times daily. 08/22/16 10/22/16  Filippou, Braxton Feathers, MD  cephALEXin (KEFLEX) 500 MG capsule Take 1 capsule (500 mg total) by mouth 3 (three) times daily for 7 days. 03/28/17 04/04/17  Fatima Blank, [place] Please provide patient with adult diapers and pads 06/03/16   Ophelia Shoulder, MD  erythromycin ophthalmic ointment Place a 1/2 inch ribbon of ointment into the lower eyelid every  6 hours. 01/10/17   Charlesetta Shanks, MD  LANTUS SOLOSTAR 100 UNIT/ML Solostar Pen Inject 10 Units into the skin daily. 12/21/16   [provider]  Oxycodone HCl 20 MG TABS Take 1 tablet (20 mg total) by mouth 3 (three) times daily as needed. 03/05/17   Lorella Nimrod, MD  Vitamin D, Ergocalciferol, (DRISDOL) 50000 units CAPS capsule Take 1 capsule (50,000 Units total) by mouth every 7 (seven)  days. 8 weeks. 03/06/17   Lorella Nimrod, MD                                                                                                                                    Past Surgical History Past Surgical History:  Procedure Laterality Date  . bil uterscospy     08/18/16 Dr. Jeffie Pollock  . COLON SURGERY    . CORONARY ANGIOPLASTY WITH STENT PLACEMENT    . CYSTOSCOPY WITH STENT PLACEMENT Bilateral 08/04/2016   Procedure: CYSTOSCOPY WITH STENT PLACEMENT bilateral bilateral retrograde fecal disimpaction;  Surgeon: Ardis Hughs, MD;  Location: [facility];  Service: Urology;  Laterality: Bilateral;  . CYSTOSCOPY/URETEROSCOPY/HOLMIUM LASER/STENT PLACEMENT Bilateral 08/18/2016   Procedure: BILATERAL URETEROSCOPY WITH HOLMIUM LASER AND STENTS;  Surgeon: Irine Seal, MD;  Location: [facility];  Service: Urology;  Laterality: Bilateral;   Family History Family History  Problem Relation Age of Onset  . Diabetes Mother   . Stroke Maternal Aunt     Social History Social History   Tobacco Use  . Smoking status: Never Smoker  . Smokeless tobacco: Never Used  Substance Use Topics  . Alcohol use: No  . Drug use: No   Allergies Penicillins  Review of Systems Review of Systems All other systems are reviewed and are negative for acute change except as noted in the HPI  Physical Exam Vital Signs  I have reviewed the triage vital signs BP 113/68 (BP Location: Left Arm)   Pulse 66   Temp 98.3 F (36.8 C) (Oral)   Resp (!) 66   Ht 5\' 4"  (1.626 m)   Wt 70.3 kg (155 lb)   SpO2 100%   BMI 26.61 kg/m   Physical Exam  Constitutional: She is oriented to person, place, and time. She appears well-developed and well-nourished. No distress.  HENT:  Head: Normocephalic and atraumatic.  Nose: Nose normal.  Eyes: Conjunctivae and EOM are normal. Pupils are equal, round, and reactive to light. Right eye exhibits no discharge. Left eye exhibits no discharge. No scleral icterus.  Neck: Normal range of  motion. Neck supple.  Cardiovascular: Normal rate and regular rhythm. Exam reveals no gallop and no friction rub.  No murmur heard. Pulmonary/Chest: Effort normal and breath sounds normal. No stridor. No respiratory distress. She has no rales.  Abdominal: Soft. She exhibits no distension. There is no tenderness.  Genitourinary:  Genitourinary Comments: Indwelling Foley in place.  Urine is cloudy and sediment this.  There is  a purple hue to the bag.  But the urine itself  has an Scientist, product/process development.  Musculoskeletal: She exhibits no edema or tenderness.  Neurological: She is alert and oriented to person, place, and time.  Baseline upper and lower extremity contractures.  Skin: Skin is warm and dry. No rash noted. She is not diaphoretic. No erythema.  Psychiatric: She has a normal mood and affect.  Vitals reviewed.   ED Results and Treatments Labs (all labs ordered are listed, but only abnormal results are displayed) Labs Reviewed  URINALYSIS, ROUTINE W REFLEX MICROSCOPIC - Abnormal; Notable for the following components:      Result Value   APPearance HAZY (*)    Protein, ur 30 (*)    Leukocytes, UA LARGE (*)    Bacteria, UA MANY (*)    Squamous Epithelial / LPF 0-5 (*)    All other components within normal limits  CBC WITH DIFFERENTIAL/PLATELET - Abnormal; Notable for the following components:   RBC 2.57 (*)    Hemoglobin 9.1 (*)    HCT 26.5 (*)    MCV 103.1 (*)    MCH 35.4 (*)    All other components within normal limits  BASIC METABOLIC PANEL - Abnormal; Notable for the following components:   Chloride 113 (*)    CO2 19 (*)    Glucose, Bld 108 (*)    BUN 49 (*)    Creatinine, Ser 2.19 (*)    GFR calc non Af Amer 22 (*)    GFR calc Af Amer 26 (*)    All other components within normal limits  URINE CULTURE                                                                                                                         EKG  EKG Interpretation  Date/Time:    Ventricular  Rate:    PR Interval:    QRS Duration:   QT Interval:    QTC Calculation:   R Axis:     Text Interpretation:        Radiology No results found. Pertinent labs & imaging results that were available during my care of the patient were reviewed by me and considered in my medical decision making (see chart for details).  Medications Ordered in ED Medications  acetaminophen (TYLENOL) tablet 1,000 mg (1,000 mg Oral Given 03/28/17 1413)  cephALEXin (KEFLEX) capsule 500 mg (500 mg Oral Given 03/28/17 1511)  Procedures Procedures  (including critical care time)  Medical Decision Making / ED Course I have reviewed the nursing notes for this encounter and the patient's prior records (if available in EHR or on provided paperwork).    Labs grossly reassuring without leukocytosis and otherwise at patient's baseline.  Urine is suspicious for infection with leukocytes and many bacteria with too numerous to count WBCs.  On review of record was noted prior cultures of multidrug-resistant E. coli sensitive to cephalosporins.  Patient will be treated with Keflex.  No nausea vomiting and able to tolerate oral hydration.  Feel she is appropriate for outpatient management.  Recommended close follow-up with PCP for test of cure.  Urinary cultures were sent for speciation.  The patient is safe for discharge with strict return precautions.   Final Clinical Impression(s) / ED Diagnoses Final diagnoses:  Indwelling Foley catheter present  Abnormal urine sediment  Acute cystitis without hematuria    Disposition: Discharge  Condition: Good  I have discussed the results, Dx and Tx plan with the patient and family who expressed understanding and agree(s) with the plan. Discharge instructions discussed at great length. The patient and family was given strict return precautions who  verbalized understanding of the instructions. No further questions at time of discharge.    ED Discharge Orders        Ordered    cephALEXin (KEFLEX) 500 MG capsule  3 times daily     03/28/17 1539       Follow Up: Primary care provider  In 1 week To assess for treatment efficacy.    This chart was dictated using voice recognition software.  Despite best efforts to proofread,  errors can occur which can change the documentation meaning.   Fatima Blank, MD 03/28/17 1718

## 2017-03-28 NOTE — ED Notes (Signed)
[facility] called waiting on transport

## 2017-03-28 NOTE — ED Notes (Signed)
Bed: WA08 Expected date:  Expected time:  Means of arrival:  Comments: 67 yo UTI?

## 2017-03-28 NOTE — ED Triage Notes (Signed)
Patient arrived via [facility] from home. Patients daughter is Medical laboratory scientific officer. Patient has a foley. Daughter noticed bag was turning a purple color. The dye in the bag was turning urine purple. The urine is a coke color. Patient c/o pain in genitale area. Urine smells. Patient is bed bound from CVA. Last foley change was 23rd of January. According to daughter patient was at her baseline for ems. Patient is now screaming out in pain. Patient was not doing that at home or ide over here. Patient did not take her blood pressure medication today.

## 2017-03-29 ENCOUNTER — Other Ambulatory Visit: Payer: Self-pay | Admitting: Internal Medicine

## 2017-03-29 DIAGNOSIS — I69959 Hemiplegia and hemiparesis following unspecified cerebrovascular disease affecting unspecified side: Secondary | ICD-10-CM

## 2017-03-29 NOTE — Telephone Encounter (Signed)
I ordered home health for Foley care.

## 2017-03-29 NOTE — Telephone Encounter (Signed)
HH referral

## 2017-03-30 ENCOUNTER — Other Ambulatory Visit: Payer: Self-pay | Admitting: Internal Medicine

## 2017-03-30 DIAGNOSIS — Z79891 Long term (current) use of opiate analgesic: Secondary | ICD-10-CM

## 2017-03-30 LAB — URINE CULTURE

## 2017-03-30 NOTE — Telephone Encounter (Signed)
Do not provide oxycodone prescription on phone. Need an appointment.

## 2017-03-30 NOTE — Telephone Encounter (Signed)
Abagail Kitchens calling on behalf of patient requesting refills on Oxycodone

## 2017-03-30 NOTE — Telephone Encounter (Signed)
Last rx written 03/05/17. Last OV 1/11 with Dr Johny Chess. Next OV 04/16/17. UDS 02/05/17.

## 2017-03-31 NOTE — Telephone Encounter (Signed)
Pt has an appt scheduled 04/16/17.

## 2017-04-05 ENCOUNTER — Other Ambulatory Visit: Payer: Self-pay | Admitting: Internal Medicine

## 2017-04-05 DIAGNOSIS — Z79891 Long term (current) use of opiate analgesic: Secondary | ICD-10-CM

## 2017-04-05 NOTE — Telephone Encounter (Signed)
Next appt scheduled 3/22 with PCP. 

## 2017-04-05 NOTE — Telephone Encounter (Signed)
Can she come to [facility] earlier and I can see her there?

## 2017-04-05 NOTE — Telephone Encounter (Signed)
Calling check on medicine, pharmacy still doesn't have it. Pain medicine

## 2017-04-05 NOTE — Telephone Encounter (Signed)
Oxycodone HCl 20 MG TABS   Refill request @ [facility] on [address]

## 2017-04-06 ENCOUNTER — Other Ambulatory Visit: Payer: Self-pay | Admitting: Internal Medicine

## 2017-04-06 DIAGNOSIS — Z79891 Long term (current) use of opiate analgesic: Secondary | ICD-10-CM

## 2017-04-06 MED ORDER — OXYCODONE HCL 20 MG PO TABS: 20.0000 mg | ORAL_TABLET | Freq: Three times a day (TID) | ORAL | 0 refills | Status: DC | PRN

## 2017-04-06 NOTE — Telephone Encounter (Signed)
Talked to Megan Mans - informed Oxycodone rx was sent to the pharmacy and pt needs to keep appt on 3/22 with Dr [name].  Stated ok and thank-you.

## 2017-04-06 NOTE — Telephone Encounter (Signed)
Talked to pt's friend Megan Mans about pt coming in to see Dr [name] in [facility] (per MD) instead of 3/22. He stated she uses transportation; need to call 2 days in advance and she's out of pain medication today. Stated she could change appt to be seen later this week but what about her medication today. Told him I will ask Dr [name].

## 2017-04-06 NOTE — Telephone Encounter (Signed)
I did sent a new prescription for 1 month supply to her pharmacy. She should keep her appointment with me on March 22 and I can provide her prescription for next month during that appointment as I do not have any openings in the beginning of April.

## 2017-04-06 NOTE — Telephone Encounter (Signed)
Talked to pt's friend Megan Mans about pt coming in to see Dr [name] in [facility] (per MD) instead of 3/22. He stated she uses transportation; need to call 2 days in advance and she's out of pain medication today. Stated she could change appt to be seen later this week but what about her medication today. Told him I will ask Dr [name].

## 2017-04-16 ENCOUNTER — Encounter: Payer: Self-pay | Admitting: Internal Medicine

## 2017-04-21 ENCOUNTER — Telehealth: Payer: Self-pay | Admitting: Internal Medicine

## 2017-04-21 DIAGNOSIS — J449 Chronic obstructive pulmonary disease, unspecified: Secondary | ICD-10-CM | POA: Diagnosis not present

## 2017-04-21 NOTE — Telephone Encounter (Signed)
You can make an appointment either in [facility] or with me during Friday afternoon.

## 2017-04-21 NOTE — Telephone Encounter (Signed)
Pt's Cap worker requesting a Gel Overlay Mattress for this patient, as the old alternating Mattress must be picked up from the [place] because she no longer has Pressure Wounds.  The newly requested Mattress will help prevent wounds.  Please advise if you would like for the patient to have an appt in the [facility] since she missed her 04/16/2017 appt with you.

## 2017-04-22 NOTE — Telephone Encounter (Signed)
[place] to sch appt.  Phone is busy.  Will call back later.

## 2017-04-26 ENCOUNTER — Telehealth: Payer: Self-pay | Admitting: Internal Medicine

## 2017-04-26 NOTE — Telephone Encounter (Signed)
Agree with VO

## 2017-04-26 NOTE — Telephone Encounter (Signed)
[facility] Rn Requesting VO for [facility]A

## 2017-04-28 ENCOUNTER — Other Ambulatory Visit: Payer: Self-pay | Admitting: Internal Medicine

## 2017-05-01 DIAGNOSIS — R1310 Dysphagia, unspecified: Secondary | ICD-10-CM | POA: Diagnosis not present

## 2017-05-01 DIAGNOSIS — Z87440 Personal history of urinary (tract) infections: Secondary | ICD-10-CM | POA: Diagnosis not present

## 2017-05-01 DIAGNOSIS — J439 Emphysema, unspecified: Secondary | ICD-10-CM | POA: Diagnosis not present

## 2017-05-01 DIAGNOSIS — Z794 Long term (current) use of insulin: Secondary | ICD-10-CM | POA: Diagnosis not present

## 2017-05-01 DIAGNOSIS — I69351 Hemiplegia and hemiparesis following cerebral infarction affecting right dominant side: Secondary | ICD-10-CM | POA: Diagnosis not present

## 2017-05-01 DIAGNOSIS — N184 Chronic kidney disease, stage 4 (severe): Secondary | ICD-10-CM | POA: Diagnosis not present

## 2017-05-01 DIAGNOSIS — Z466 Encounter for fitting and adjustment of urinary device: Secondary | ICD-10-CM | POA: Diagnosis not present

## 2017-05-01 DIAGNOSIS — I2510 Atherosclerotic heart disease of native coronary artery without angina pectoris: Secondary | ICD-10-CM | POA: Diagnosis not present

## 2017-05-01 DIAGNOSIS — G4733 Obstructive sleep apnea (adult) (pediatric): Secondary | ICD-10-CM | POA: Diagnosis not present

## 2017-05-01 DIAGNOSIS — Z79891 Long term (current) use of opiate analgesic: Secondary | ICD-10-CM | POA: Diagnosis not present

## 2017-05-01 DIAGNOSIS — D631 Anemia in chronic kidney disease: Secondary | ICD-10-CM | POA: Diagnosis not present

## 2017-05-01 DIAGNOSIS — I69391 Dysphagia following cerebral infarction: Secondary | ICD-10-CM | POA: Diagnosis not present

## 2017-05-01 DIAGNOSIS — I5032 Chronic diastolic (congestive) heart failure: Secondary | ICD-10-CM | POA: Diagnosis not present

## 2017-05-01 DIAGNOSIS — I69320 Aphasia following cerebral infarction: Secondary | ICD-10-CM | POA: Diagnosis not present

## 2017-05-01 DIAGNOSIS — I130 Hypertensive heart and chronic kidney disease with heart failure and stage 1 through stage 4 chronic kidney disease, or unspecified chronic kidney disease: Secondary | ICD-10-CM | POA: Diagnosis not present

## 2017-05-01 DIAGNOSIS — M1990 Unspecified osteoarthritis, unspecified site: Secondary | ICD-10-CM | POA: Diagnosis not present

## 2017-05-01 DIAGNOSIS — Z9181 History of falling: Secondary | ICD-10-CM | POA: Diagnosis not present

## 2017-05-01 DIAGNOSIS — E1122 Type 2 diabetes mellitus with diabetic chronic kidney disease: Secondary | ICD-10-CM | POA: Diagnosis not present

## 2017-05-03 ENCOUNTER — Telehealth: Payer: Self-pay | Admitting: Internal Medicine

## 2017-05-03 ENCOUNTER — Telehealth: Payer: Self-pay | Admitting: *Deleted

## 2017-05-03 NOTE — Telephone Encounter (Signed)
This has been addressed in separate phone encounter from today. L. Joseandres Mazer, RN, BSN   

## 2017-05-03 NOTE — Telephone Encounter (Signed)
Rec'd call from [facility] to f/u with VO for Michelle Holmes [facility] with [facility].  Called [facility] and spoke with Adacia and verified patient was seen for VO for [name] on 05/01/2017 by Darlene.  Pt is also sch for another appointment with Dr. [name] on 05/28/2017 to discuss patient's request for a RX for a gel overlay mattress for he hospital bed as ins will require OV documentation for this request. Also Ms, Felipa Evener  Social Worker from [facility] would like to discuss possible misuse of pain medications for this patient.

## 2017-05-03 NOTE — Telephone Encounter (Signed)
Per Joy ([place]), last visit with patient was 04/22/17 & noted 1 pill left of oxycodone. Med was ordered 04/06/17, 90 tabs. Patient's dtr told her then that she tries not to give too much to patient, maybe 1 a day. Which would leave plenty left in bottle.  Joy expressed suspicion of inappropriate use of this med & wanted to let MD know. Has appt w/ pcp 05/28/17. pls advise!

## 2017-05-03 NOTE — Telephone Encounter (Signed)
Thank you for letting me know

## 2017-05-04 ENCOUNTER — Telehealth: Payer: Self-pay

## 2017-05-04 DIAGNOSIS — Z466 Encounter for fitting and adjustment of urinary device: Secondary | ICD-10-CM | POA: Diagnosis not present

## 2017-05-04 NOTE — Telephone Encounter (Signed)
Michelle Holmes with [facility] requesting VO. Please call back.

## 2017-05-05 NOTE — Telephone Encounter (Signed)
Received return call from [facility], LPN with [facility] requesting verbal orders for Nursing to help with disease management, once a month foley changes, and PT eval.  Requesting 3 visits for month 1, 1 visit for mth 2, and 3 prn visits (if needed).  Verbal authorization given, will send to pcp for review.  Dr. [name], do you agree?Michelle Holmes, Michelle Graves Cassady4/10/201910:13 AM

## 2017-05-05 NOTE — Telephone Encounter (Signed)
Called Michelle Holmes at [facility] - unavailable, left message to call the office.

## 2017-05-05 NOTE — Telephone Encounter (Signed)
It's ok with me. Thanks  [employer]

## 2017-05-06 DIAGNOSIS — Z9181 History of falling: Secondary | ICD-10-CM | POA: Diagnosis not present

## 2017-05-06 DIAGNOSIS — N184 Chronic kidney disease, stage 4 (severe): Secondary | ICD-10-CM | POA: Diagnosis not present

## 2017-05-06 DIAGNOSIS — E1122 Type 2 diabetes mellitus with diabetic chronic kidney disease: Secondary | ICD-10-CM | POA: Diagnosis not present

## 2017-05-06 DIAGNOSIS — I5032 Chronic diastolic (congestive) heart failure: Secondary | ICD-10-CM | POA: Diagnosis not present

## 2017-05-06 DIAGNOSIS — Z794 Long term (current) use of insulin: Secondary | ICD-10-CM | POA: Diagnosis not present

## 2017-05-06 DIAGNOSIS — I2510 Atherosclerotic heart disease of native coronary artery without angina pectoris: Secondary | ICD-10-CM | POA: Diagnosis not present

## 2017-05-06 DIAGNOSIS — Z466 Encounter for fitting and adjustment of urinary device: Secondary | ICD-10-CM | POA: Diagnosis not present

## 2017-05-06 DIAGNOSIS — I69320 Aphasia following cerebral infarction: Secondary | ICD-10-CM | POA: Diagnosis not present

## 2017-05-06 DIAGNOSIS — Z79891 Long term (current) use of opiate analgesic: Secondary | ICD-10-CM | POA: Diagnosis not present

## 2017-05-06 DIAGNOSIS — D631 Anemia in chronic kidney disease: Secondary | ICD-10-CM | POA: Diagnosis not present

## 2017-05-06 DIAGNOSIS — J439 Emphysema, unspecified: Secondary | ICD-10-CM | POA: Diagnosis not present

## 2017-05-06 DIAGNOSIS — M1990 Unspecified osteoarthritis, unspecified site: Secondary | ICD-10-CM | POA: Diagnosis not present

## 2017-05-06 DIAGNOSIS — G4733 Obstructive sleep apnea (adult) (pediatric): Secondary | ICD-10-CM | POA: Diagnosis not present

## 2017-05-06 DIAGNOSIS — I69351 Hemiplegia and hemiparesis following cerebral infarction affecting right dominant side: Secondary | ICD-10-CM | POA: Diagnosis not present

## 2017-05-06 DIAGNOSIS — I69391 Dysphagia following cerebral infarction: Secondary | ICD-10-CM | POA: Diagnosis not present

## 2017-05-06 DIAGNOSIS — I130 Hypertensive heart and chronic kidney disease with heart failure and stage 1 through stage 4 chronic kidney disease, or unspecified chronic kidney disease: Secondary | ICD-10-CM | POA: Diagnosis not present

## 2017-05-06 DIAGNOSIS — R1310 Dysphagia, unspecified: Secondary | ICD-10-CM | POA: Diagnosis not present

## 2017-05-06 DIAGNOSIS — Z87440 Personal history of urinary (tract) infections: Secondary | ICD-10-CM | POA: Diagnosis not present

## 2017-05-06 NOTE — Telephone Encounter (Signed)
Thanks

## 2017-05-06 NOTE — Telephone Encounter (Signed)
Thanks!  Patient was seen on 05/01/2017 with [facility].

## 2017-05-06 NOTE — Telephone Encounter (Signed)
Patient was sch on 04/16/2017 and no showed her appt.   Pt's caregiver Narda Rutherford did resch appt again to 05/28/2017.

## 2017-05-10 DIAGNOSIS — Z79891 Long term (current) use of opiate analgesic: Secondary | ICD-10-CM | POA: Diagnosis not present

## 2017-05-10 DIAGNOSIS — Z9181 History of falling: Secondary | ICD-10-CM | POA: Diagnosis not present

## 2017-05-10 DIAGNOSIS — I69391 Dysphagia following cerebral infarction: Secondary | ICD-10-CM | POA: Diagnosis not present

## 2017-05-10 DIAGNOSIS — Z466 Encounter for fitting and adjustment of urinary device: Secondary | ICD-10-CM | POA: Diagnosis not present

## 2017-05-10 DIAGNOSIS — I69351 Hemiplegia and hemiparesis following cerebral infarction affecting right dominant side: Secondary | ICD-10-CM | POA: Diagnosis not present

## 2017-05-10 DIAGNOSIS — I69320 Aphasia following cerebral infarction: Secondary | ICD-10-CM | POA: Diagnosis not present

## 2017-05-10 DIAGNOSIS — G4733 Obstructive sleep apnea (adult) (pediatric): Secondary | ICD-10-CM | POA: Diagnosis not present

## 2017-05-10 DIAGNOSIS — Z794 Long term (current) use of insulin: Secondary | ICD-10-CM | POA: Diagnosis not present

## 2017-05-10 DIAGNOSIS — J439 Emphysema, unspecified: Secondary | ICD-10-CM | POA: Diagnosis not present

## 2017-05-10 DIAGNOSIS — I2510 Atherosclerotic heart disease of native coronary artery without angina pectoris: Secondary | ICD-10-CM | POA: Diagnosis not present

## 2017-05-10 DIAGNOSIS — E1122 Type 2 diabetes mellitus with diabetic chronic kidney disease: Secondary | ICD-10-CM | POA: Diagnosis not present

## 2017-05-10 DIAGNOSIS — R1310 Dysphagia, unspecified: Secondary | ICD-10-CM | POA: Diagnosis not present

## 2017-05-10 DIAGNOSIS — M1990 Unspecified osteoarthritis, unspecified site: Secondary | ICD-10-CM | POA: Diagnosis not present

## 2017-05-10 DIAGNOSIS — Z87440 Personal history of urinary (tract) infections: Secondary | ICD-10-CM | POA: Diagnosis not present

## 2017-05-10 DIAGNOSIS — I130 Hypertensive heart and chronic kidney disease with heart failure and stage 1 through stage 4 chronic kidney disease, or unspecified chronic kidney disease: Secondary | ICD-10-CM | POA: Diagnosis not present

## 2017-05-10 DIAGNOSIS — I5032 Chronic diastolic (congestive) heart failure: Secondary | ICD-10-CM | POA: Diagnosis not present

## 2017-05-10 DIAGNOSIS — D631 Anemia in chronic kidney disease: Secondary | ICD-10-CM | POA: Diagnosis not present

## 2017-05-10 DIAGNOSIS — N184 Chronic kidney disease, stage 4 (severe): Secondary | ICD-10-CM | POA: Diagnosis not present

## 2017-05-11 ENCOUNTER — Telehealth: Payer: Self-pay

## 2017-05-11 DIAGNOSIS — I69320 Aphasia following cerebral infarction: Secondary | ICD-10-CM | POA: Diagnosis not present

## 2017-05-11 DIAGNOSIS — N184 Chronic kidney disease, stage 4 (severe): Secondary | ICD-10-CM | POA: Diagnosis not present

## 2017-05-11 DIAGNOSIS — I69391 Dysphagia following cerebral infarction: Secondary | ICD-10-CM | POA: Diagnosis not present

## 2017-05-11 DIAGNOSIS — I69351 Hemiplegia and hemiparesis following cerebral infarction affecting right dominant side: Secondary | ICD-10-CM | POA: Diagnosis not present

## 2017-05-11 DIAGNOSIS — Z87440 Personal history of urinary (tract) infections: Secondary | ICD-10-CM | POA: Diagnosis not present

## 2017-05-11 DIAGNOSIS — I2510 Atherosclerotic heart disease of native coronary artery without angina pectoris: Secondary | ICD-10-CM | POA: Diagnosis not present

## 2017-05-11 DIAGNOSIS — R1310 Dysphagia, unspecified: Secondary | ICD-10-CM | POA: Diagnosis not present

## 2017-05-11 DIAGNOSIS — I5032 Chronic diastolic (congestive) heart failure: Secondary | ICD-10-CM | POA: Diagnosis not present

## 2017-05-11 DIAGNOSIS — Z9181 History of falling: Secondary | ICD-10-CM | POA: Diagnosis not present

## 2017-05-11 DIAGNOSIS — G4733 Obstructive sleep apnea (adult) (pediatric): Secondary | ICD-10-CM | POA: Diagnosis not present

## 2017-05-11 DIAGNOSIS — D631 Anemia in chronic kidney disease: Secondary | ICD-10-CM | POA: Diagnosis not present

## 2017-05-11 DIAGNOSIS — E1122 Type 2 diabetes mellitus with diabetic chronic kidney disease: Secondary | ICD-10-CM | POA: Diagnosis not present

## 2017-05-11 DIAGNOSIS — Z794 Long term (current) use of insulin: Secondary | ICD-10-CM | POA: Diagnosis not present

## 2017-05-11 DIAGNOSIS — J439 Emphysema, unspecified: Secondary | ICD-10-CM | POA: Diagnosis not present

## 2017-05-11 DIAGNOSIS — Z79891 Long term (current) use of opiate analgesic: Secondary | ICD-10-CM | POA: Diagnosis not present

## 2017-05-11 DIAGNOSIS — I130 Hypertensive heart and chronic kidney disease with heart failure and stage 1 through stage 4 chronic kidney disease, or unspecified chronic kidney disease: Secondary | ICD-10-CM | POA: Diagnosis not present

## 2017-05-11 DIAGNOSIS — M1990 Unspecified osteoarthritis, unspecified site: Secondary | ICD-10-CM | POA: Diagnosis not present

## 2017-05-11 DIAGNOSIS — Z466 Encounter for fitting and adjustment of urinary device: Secondary | ICD-10-CM | POA: Diagnosis not present

## 2017-05-11 NOTE — Telephone Encounter (Signed)
[name] with [facility] requesting VO for PT . Please call back.

## 2017-05-11 NOTE — Telephone Encounter (Signed)
[facility] PT request 2x week for 4 weeks for transfers, strengthening/ conditioning, safety. VO given, do you agree?

## 2017-05-18 DIAGNOSIS — I69391 Dysphagia following cerebral infarction: Secondary | ICD-10-CM | POA: Diagnosis not present

## 2017-05-18 DIAGNOSIS — I2510 Atherosclerotic heart disease of native coronary artery without angina pectoris: Secondary | ICD-10-CM | POA: Diagnosis not present

## 2017-05-18 DIAGNOSIS — I69320 Aphasia following cerebral infarction: Secondary | ICD-10-CM | POA: Diagnosis not present

## 2017-05-18 DIAGNOSIS — D631 Anemia in chronic kidney disease: Secondary | ICD-10-CM | POA: Diagnosis not present

## 2017-05-18 DIAGNOSIS — J439 Emphysema, unspecified: Secondary | ICD-10-CM | POA: Diagnosis not present

## 2017-05-18 DIAGNOSIS — I130 Hypertensive heart and chronic kidney disease with heart failure and stage 1 through stage 4 chronic kidney disease, or unspecified chronic kidney disease: Secondary | ICD-10-CM | POA: Diagnosis not present

## 2017-05-18 DIAGNOSIS — M1990 Unspecified osteoarthritis, unspecified site: Secondary | ICD-10-CM | POA: Diagnosis not present

## 2017-05-18 DIAGNOSIS — N184 Chronic kidney disease, stage 4 (severe): Secondary | ICD-10-CM | POA: Diagnosis not present

## 2017-05-18 DIAGNOSIS — E1122 Type 2 diabetes mellitus with diabetic chronic kidney disease: Secondary | ICD-10-CM | POA: Diagnosis not present

## 2017-05-18 DIAGNOSIS — R1310 Dysphagia, unspecified: Secondary | ICD-10-CM | POA: Diagnosis not present

## 2017-05-18 DIAGNOSIS — Z466 Encounter for fitting and adjustment of urinary device: Secondary | ICD-10-CM | POA: Diagnosis not present

## 2017-05-18 DIAGNOSIS — I69351 Hemiplegia and hemiparesis following cerebral infarction affecting right dominant side: Secondary | ICD-10-CM | POA: Diagnosis not present

## 2017-05-18 DIAGNOSIS — I5032 Chronic diastolic (congestive) heart failure: Secondary | ICD-10-CM | POA: Diagnosis not present

## 2017-05-20 DIAGNOSIS — M1990 Unspecified osteoarthritis, unspecified site: Secondary | ICD-10-CM | POA: Diagnosis not present

## 2017-05-20 DIAGNOSIS — D631 Anemia in chronic kidney disease: Secondary | ICD-10-CM | POA: Diagnosis not present

## 2017-05-20 DIAGNOSIS — I69391 Dysphagia following cerebral infarction: Secondary | ICD-10-CM | POA: Diagnosis not present

## 2017-05-20 DIAGNOSIS — R1310 Dysphagia, unspecified: Secondary | ICD-10-CM | POA: Diagnosis not present

## 2017-05-20 DIAGNOSIS — Z466 Encounter for fitting and adjustment of urinary device: Secondary | ICD-10-CM | POA: Diagnosis not present

## 2017-05-20 DIAGNOSIS — I2510 Atherosclerotic heart disease of native coronary artery without angina pectoris: Secondary | ICD-10-CM | POA: Diagnosis not present

## 2017-05-20 DIAGNOSIS — E1122 Type 2 diabetes mellitus with diabetic chronic kidney disease: Secondary | ICD-10-CM | POA: Diagnosis not present

## 2017-05-20 DIAGNOSIS — I69320 Aphasia following cerebral infarction: Secondary | ICD-10-CM | POA: Diagnosis not present

## 2017-05-20 DIAGNOSIS — J439 Emphysema, unspecified: Secondary | ICD-10-CM | POA: Diagnosis not present

## 2017-05-20 DIAGNOSIS — I69351 Hemiplegia and hemiparesis following cerebral infarction affecting right dominant side: Secondary | ICD-10-CM | POA: Diagnosis not present

## 2017-05-20 DIAGNOSIS — I5032 Chronic diastolic (congestive) heart failure: Secondary | ICD-10-CM | POA: Diagnosis not present

## 2017-05-20 DIAGNOSIS — I130 Hypertensive heart and chronic kidney disease with heart failure and stage 1 through stage 4 chronic kidney disease, or unspecified chronic kidney disease: Secondary | ICD-10-CM | POA: Diagnosis not present

## 2017-05-20 DIAGNOSIS — N184 Chronic kidney disease, stage 4 (severe): Secondary | ICD-10-CM | POA: Diagnosis not present

## 2017-05-21 NOTE — Telephone Encounter (Signed)
That's fine

## 2017-05-22 DIAGNOSIS — J449 Chronic obstructive pulmonary disease, unspecified: Secondary | ICD-10-CM | POA: Diagnosis not present

## 2017-05-27 DIAGNOSIS — M1990 Unspecified osteoarthritis, unspecified site: Secondary | ICD-10-CM | POA: Diagnosis not present

## 2017-05-27 DIAGNOSIS — I130 Hypertensive heart and chronic kidney disease with heart failure and stage 1 through stage 4 chronic kidney disease, or unspecified chronic kidney disease: Secondary | ICD-10-CM | POA: Diagnosis not present

## 2017-05-27 DIAGNOSIS — I69391 Dysphagia following cerebral infarction: Secondary | ICD-10-CM | POA: Diagnosis not present

## 2017-05-27 DIAGNOSIS — I69320 Aphasia following cerebral infarction: Secondary | ICD-10-CM | POA: Diagnosis not present

## 2017-05-27 DIAGNOSIS — I69351 Hemiplegia and hemiparesis following cerebral infarction affecting right dominant side: Secondary | ICD-10-CM | POA: Diagnosis not present

## 2017-05-27 DIAGNOSIS — Z466 Encounter for fitting and adjustment of urinary device: Secondary | ICD-10-CM | POA: Diagnosis not present

## 2017-05-27 DIAGNOSIS — J439 Emphysema, unspecified: Secondary | ICD-10-CM | POA: Diagnosis not present

## 2017-05-27 DIAGNOSIS — I5032 Chronic diastolic (congestive) heart failure: Secondary | ICD-10-CM | POA: Diagnosis not present

## 2017-05-27 DIAGNOSIS — I2510 Atherosclerotic heart disease of native coronary artery without angina pectoris: Secondary | ICD-10-CM | POA: Diagnosis not present

## 2017-05-27 DIAGNOSIS — N319 Neuromuscular dysfunction of bladder, unspecified: Secondary | ICD-10-CM | POA: Diagnosis not present

## 2017-05-27 DIAGNOSIS — D631 Anemia in chronic kidney disease: Secondary | ICD-10-CM | POA: Diagnosis not present

## 2017-05-27 DIAGNOSIS — N184 Chronic kidney disease, stage 4 (severe): Secondary | ICD-10-CM | POA: Diagnosis not present

## 2017-05-27 DIAGNOSIS — E1122 Type 2 diabetes mellitus with diabetic chronic kidney disease: Secondary | ICD-10-CM | POA: Diagnosis not present

## 2017-05-28 ENCOUNTER — Encounter: Payer: Self-pay | Admitting: Internal Medicine

## 2017-05-28 ENCOUNTER — Encounter (INDEPENDENT_AMBULATORY_CARE_PROVIDER_SITE_OTHER): Payer: Self-pay

## 2017-05-28 ENCOUNTER — Ambulatory Visit (INDEPENDENT_AMBULATORY_CARE_PROVIDER_SITE_OTHER): Payer: Medicare Other | Admitting: Internal Medicine

## 2017-05-28 VITALS — BP 166/73 | HR 76 | Temp 98.6°F | Ht 62.0 in

## 2017-05-28 DIAGNOSIS — J439 Emphysema, unspecified: Secondary | ICD-10-CM | POA: Diagnosis not present

## 2017-05-28 DIAGNOSIS — E118 Type 2 diabetes mellitus with unspecified complications: Secondary | ICD-10-CM | POA: Diagnosis not present

## 2017-05-28 DIAGNOSIS — I69351 Hemiplegia and hemiparesis following cerebral infarction affecting right dominant side: Secondary | ICD-10-CM | POA: Diagnosis not present

## 2017-05-28 DIAGNOSIS — I2510 Atherosclerotic heart disease of native coronary artery without angina pectoris: Secondary | ICD-10-CM | POA: Diagnosis not present

## 2017-05-28 DIAGNOSIS — Z466 Encounter for fitting and adjustment of urinary device: Secondary | ICD-10-CM | POA: Diagnosis not present

## 2017-05-28 DIAGNOSIS — N189 Chronic kidney disease, unspecified: Secondary | ICD-10-CM | POA: Diagnosis not present

## 2017-05-28 DIAGNOSIS — R300 Dysuria: Secondary | ICD-10-CM

## 2017-05-28 DIAGNOSIS — T83511A Infection and inflammatory reaction due to indwelling urethral catheter, initial encounter: Secondary | ICD-10-CM

## 2017-05-28 DIAGNOSIS — I69320 Aphasia following cerebral infarction: Secondary | ICD-10-CM | POA: Diagnosis not present

## 2017-05-28 DIAGNOSIS — E1122 Type 2 diabetes mellitus with diabetic chronic kidney disease: Secondary | ICD-10-CM | POA: Diagnosis not present

## 2017-05-28 DIAGNOSIS — G8191 Hemiplegia, unspecified affecting right dominant side: Secondary | ICD-10-CM | POA: Diagnosis not present

## 2017-05-28 DIAGNOSIS — R339 Retention of urine, unspecified: Secondary | ICD-10-CM | POA: Diagnosis not present

## 2017-05-28 DIAGNOSIS — N319 Neuromuscular dysfunction of bladder, unspecified: Secondary | ICD-10-CM | POA: Diagnosis not present

## 2017-05-28 DIAGNOSIS — I129 Hypertensive chronic kidney disease with stage 1 through stage 4 chronic kidney disease, or unspecified chronic kidney disease: Secondary | ICD-10-CM

## 2017-05-28 DIAGNOSIS — Z960 Presence of urogenital implants: Secondary | ICD-10-CM

## 2017-05-28 DIAGNOSIS — N390 Urinary tract infection, site not specified: Secondary | ICD-10-CM

## 2017-05-28 DIAGNOSIS — I69391 Dysphagia following cerebral infarction: Secondary | ICD-10-CM | POA: Diagnosis not present

## 2017-05-28 DIAGNOSIS — Z79899 Other long term (current) drug therapy: Secondary | ICD-10-CM

## 2017-05-28 DIAGNOSIS — I10 Essential (primary) hypertension: Secondary | ICD-10-CM

## 2017-05-28 DIAGNOSIS — Z794 Long term (current) use of insulin: Secondary | ICD-10-CM | POA: Diagnosis not present

## 2017-05-28 DIAGNOSIS — D631 Anemia in chronic kidney disease: Secondary | ICD-10-CM | POA: Diagnosis not present

## 2017-05-28 DIAGNOSIS — Z87440 Personal history of urinary (tract) infections: Secondary | ICD-10-CM

## 2017-05-28 DIAGNOSIS — Z79891 Long term (current) use of opiate analgesic: Secondary | ICD-10-CM | POA: Diagnosis not present

## 2017-05-28 DIAGNOSIS — G8929 Other chronic pain: Secondary | ICD-10-CM | POA: Diagnosis not present

## 2017-05-28 DIAGNOSIS — N184 Chronic kidney disease, stage 4 (severe): Secondary | ICD-10-CM | POA: Diagnosis not present

## 2017-05-28 DIAGNOSIS — I5032 Chronic diastolic (congestive) heart failure: Secondary | ICD-10-CM | POA: Diagnosis not present

## 2017-05-28 DIAGNOSIS — M1990 Unspecified osteoarthritis, unspecified site: Secondary | ICD-10-CM | POA: Diagnosis not present

## 2017-05-28 DIAGNOSIS — R338 Other retention of urine: Secondary | ICD-10-CM

## 2017-05-28 DIAGNOSIS — I130 Hypertensive heart and chronic kidney disease with heart failure and stage 1 through stage 4 chronic kidney disease, or unspecified chronic kidney disease: Secondary | ICD-10-CM | POA: Diagnosis not present

## 2017-05-28 LAB — POCT GLYCOSYLATED HEMOGLOBIN (HGB A1C): Hemoglobin A1C: 5.1

## 2017-05-28 LAB — POCT URINALYSIS DIPSTICK
Bilirubin, UA: NEGATIVE
Glucose, UA: NEGATIVE
Ketones, UA: NEGATIVE
Nitrite, UA: POSITIVE
Protein, UA: 100
Spec Grav, UA: 1.01 (ref 1.010–1.025)
Urobilinogen, UA: 0.2 E.U./dL
pH, UA: >=9 — AB (ref 5.0–8.0)

## 2017-05-28 LAB — GLUCOSE, CAPILLARY: Glucose-Capillary: 94 mg/dL (ref 65–99)

## 2017-05-28 MED ORDER — OXYCODONE HCL 10 MG PO TABS: 10.0000 mg | ORAL_TABLET | Freq: Three times a day (TID) | ORAL | 0 refills | Status: DC

## 2017-05-28 MED ORDER — CEPHALEXIN 250 MG PO CAPS
250.0000 mg | ORAL_CAPSULE | Freq: Three times a day (TID) | ORAL | 0 refills | Status: AC
Start: 2017-05-28 — End: 2017-06-02

## 2017-05-28 MED ORDER — ONDANSETRON HCL 4 MG PO TABS: 4.0000 mg | ORAL_TABLET | Freq: Every day | ORAL | 1 refills | Status: AC | PRN

## 2017-05-28 NOTE — Assessment & Plan Note (Signed)
A1c checked today was 5.1.  Before patient was getting Lantus 1-2 times a week by the previous caregiver. Recently daughter started giving her 10 units daily at bedtime. Denies any hypoglycemia.  Discontinue Lantus at this time. Repeat A1c in 60-month.

## 2017-05-28 NOTE — Assessment & Plan Note (Signed)
BP Readings from Last 3 Encounters:  05/28/17 (!) 166/73  03/28/17 113/68  03/05/17 (!) 147/77   Her blood pressure was elevated today. She did not took her morning meds.  Is feeling little nauseated. Her amlodipine was increased to 10 mg daily during previous follow-up visit.  Continue amlodipine 10 mg daily and we will reevaluate during next follow-up appointment.

## 2017-05-28 NOTE — Assessment & Plan Note (Signed)
Urine dipstick was done in the clinic because of her complaint of lower abdominal pain along with Pesantez-colored strong smelling urine. Urine dipstick was positive for large amount of leukocytes and nitrite.  Patient is with chronic Foley which predisposes her to UTI. UA and urine culture was sent. Patient was given a prescription of Keflex-antibiotics can be modified if needed according to sensitivity report.

## 2017-05-28 NOTE — Progress Notes (Signed)
CC: For follow-up of her diabetes, hypertension, right hemiparesis and chronic pain.  HPI:  Michelle Holmes is a 67 y.o. with past medical history as listed below came to the clinic for her follow-up.  Today she was accompanied with her daughter who wants to be her power of attorney and trying to take over her care. There was some concern in the past to about diversion of her oxycodone.  Home health nurses also left a message on phone regarding their concern.  Family also become concerned that she is not getting her medications as directed and recently fired her previous caregiver.  Before this visit she used to come with a caregiver who was helping her with medications through a church.  There was some concern that he was taking her oxycodone.  Had a long discussion with daughter today and told her that they can file a police report if they want to. According to her daughter she started feeling nauseated and vomited once after taking oxycodone she noticed similar symptoms couple of time, and she was wondering why we are giving her something which makes her nauseated. According to patient she is not taking her oxycodone more than once a day, and occasionally oxycodone make her sick.  We discussed regarding switching to tramadol but according to daughter that patient breaks out with tramadol in the past. She is not a candidate for NSAIDs because of her CKD.  We had a long discussion today regarding her opioid use, patient do experience intermittent pain mostly related to her hemiplegic side.  We discussed that they can try Tylenol first if that will not help then take oxycodone.  We will decrease her oxycodone dose from 20 mg 3 times a day to 10 mg 3 times a day if needed.  Daughter will report to [place] in a month how much oxycodone she really needs and what was her response during next follow-up visit and then we will modify of her prescription accordingly.  Patient was also complaining of lower  abdominal pain.  According to daughter her urine looks more Domingos with strong smell which is not usual for her.  Her last Foley change was 2 weeks ago, normally she gets changed every month.  Patient has an history of UTI in the past where she grew E. Coli.  She denies any fever or chills, does having intermittent nausea for couple of days.  Daughter was also concerned about dryness of her skin and was asking some advice.  She was advised to use Aquaphor/Eucerin and keep her mother well-hydrated.  See assessment and plan for her chronic conditions.  Past Medical History:  Diagnosis Date  . Anemia   . CHF (congestive heart failure) ([place])   . Chronic back pain   . COPD (chronic obstructive pulmonary disease) ([place])   . Coronary artery disease   . Diabetes mellitus   . DJD (degenerative joint disease)   . Emphysema   . Hypercholesteremia   . Hypertension   . Myocardial infarction ([place])   . Obesity   . Obesity hypoventilation syndrome ([place])   . Renal insufficiency   . Scoliosis   . Sleep apnea   . Stroke ([place])   . TIA (transient ischemic attack)    Review of Systems: Negative except mentioned in HPI.  Physical Exam:  Vitals:   05/28/17 1453  BP: (!) 147/105  Pulse: 72  Temp: 98.6 F (37 C)  TempSrc: Oral  SpO2: 100%  Height: 5\' 2"  (1.575 m)  General: Vital signs reviewed.  Patient is well-developed and well-nourished, in no acute distress and cooperative with exam.  Head: Normocephalic and atraumatic. Eyes: EOMI, conjunctivae normal, no scleral icterus.  Cardiovascular: RRR, S1 normal, S2 normal, no murmurs, gallops, or rubs. Pulmonary/Chest: Clear to auscultation bilaterally, no wheezes, rales, or rhonchi. Abdominal: Soft, non-tender, non-distended, BS +, no masses, organomegaly, or guarding present.  Extremities: No lower extremity edema bilaterally,  pulses symmetric and intact bilaterally. No cyanosis or clubbing. Neurological: A&O x3, right hemiparesis with some  contractures. Skin: Warm, dry and intact.  Lower extremities with some dry flaky skin. Psychiatric: Normal mood and affect. speech and behavior is normal. Cognition and memory are normal.  Assessment & Plan:   See Encounters Tab for problem based charting.  Patient discussed with Dr. Rebeca Alert.

## 2017-05-28 NOTE — Assessment & Plan Note (Signed)
Patient continued to have Foley catheter, normally get it changed each month. Last change was 2 weeks ago.  Continue existing Foley care.

## 2017-05-28 NOTE — Assessment & Plan Note (Signed)
Today she was accompanied with her daughter who wants to be her power of attorney and trying to take over her care. There was some concern in the past to about diversion of her oxycodone.  Home health nurses also left a message on phone regarding their concern.  Family also become concerned that she is not getting her medications as directed and recently fired her previous caregiver.  Before this visit she used to come with a caregiver who was helping her with medications through a church.  There was some concern that he was taking her oxycodone.  Had a long discussion with daughter today and told her that they can file a police report if they want to. According to her daughter she started feeling nauseated and vomited once after taking oxycodone she noticed similar symptoms couple of time, and she was wondering why we are giving her something which makes her nauseated. According to patient she is not taking her oxycodone more than once a day, and occasionally oxycodone make her sick.  We discussed regarding switching to tramadol but according to daughter that patient breaks out with tramadol in the past. She is not a candidate for NSAIDs because of her CKD.  We had a long discussion today regarding her opioid use, patient do experience intermittent pain mostly related to her hemiplegic side.  We discussed that they can try Tylenol first if that will not help then take oxycodone.  We will decrease her oxycodone dose from 20 mg 3 times a day to 10 mg 3 times a day if needed.  Daughter will report to [place] in a month how much oxycodone she really needs and what was her response during next follow-up visit and then we will modify of her prescription accordingly.  -She was provided with a prescription of oxycodone 10 mg which she can use up to 3 times a day with 90 pills. -Daughter will monitor her issues and try to minimize it as she thinks that her nausea and occasional vomiting is related to her oxycodone.\

## 2017-05-28 NOTE — Patient Instructions (Addendum)
Thank you for visiting clinic today. As we discussed I am decreasing the dose of your pain medicine, I gave you a new prescription of oxycodone 10 mg 3 times a day please use it only if you needed.  Try using Tylenol before taking oxycodone and see if that will help with your pain. I am also giving you Zofran prescription you can take it for your nausea. Your urine looks infected-I am sending your urine for culture and giving you a prescription of an antibiotic called Keflex for 5 days, take it as directed. We can make changes to your antibiotic if needed according to your sensitivity report. Please follow-up in 1 month.

## 2017-05-29 LAB — MICROSCOPIC EXAMINATION: Casts: NONE SEEN /lpf

## 2017-05-29 LAB — URINALYSIS, ROUTINE W REFLEX MICROSCOPIC
Bilirubin, UA: NEGATIVE
Glucose, UA: NEGATIVE
Ketones, UA: NEGATIVE
Nitrite, UA: POSITIVE — AB
RBC, UA: NEGATIVE
Specific Gravity, UA: 1.016 (ref 1.005–1.030)
Urobilinogen, Ur: 0.2 mg/dL (ref 0.2–1.0)
pH, UA: >=9 — AB (ref 5.0–7.5)

## 2017-05-30 LAB — URINE CULTURE

## 2017-05-31 NOTE — Progress Notes (Signed)
Internal Medicine Clinic Attending  Case discussed with Dr. [name]  at the time of the visit.  We reviewed the resident's history and exam and pertinent patient test results.  I agree with the assessment, diagnosis, and plan of care documented in the resident's note.  Unfortunately, seems very likely her oxycodone was being diverted by her former caregiver. Police report should be filed. Patient appears to be in a safe environment at this time, but warrants close monitoring given her demonstrated vulnerability. Pain control requirements difficult to assess at this time, but hopefully will wean off of oxycodone and use acetaminophen and topical NSAIDs.   UA demonstrates pyuria, culture with multiple organisms more suggestive of chronic colonization than active infection, especially with the only "symptom" being change in urine appearance and odor. No indication to continue treating at this time in the absence of urinary tract symptoms.   Oda Kilts, MD

## 2017-06-02 DIAGNOSIS — E1122 Type 2 diabetes mellitus with diabetic chronic kidney disease: Secondary | ICD-10-CM | POA: Diagnosis not present

## 2017-06-02 DIAGNOSIS — Z466 Encounter for fitting and adjustment of urinary device: Secondary | ICD-10-CM | POA: Diagnosis not present

## 2017-06-02 DIAGNOSIS — I69320 Aphasia following cerebral infarction: Secondary | ICD-10-CM | POA: Diagnosis not present

## 2017-06-02 DIAGNOSIS — I69391 Dysphagia following cerebral infarction: Secondary | ICD-10-CM | POA: Diagnosis not present

## 2017-06-02 DIAGNOSIS — I5032 Chronic diastolic (congestive) heart failure: Secondary | ICD-10-CM | POA: Diagnosis not present

## 2017-06-02 DIAGNOSIS — I69351 Hemiplegia and hemiparesis following cerebral infarction affecting right dominant side: Secondary | ICD-10-CM | POA: Diagnosis not present

## 2017-06-02 DIAGNOSIS — J439 Emphysema, unspecified: Secondary | ICD-10-CM | POA: Diagnosis not present

## 2017-06-02 DIAGNOSIS — D631 Anemia in chronic kidney disease: Secondary | ICD-10-CM | POA: Diagnosis not present

## 2017-06-02 DIAGNOSIS — M1990 Unspecified osteoarthritis, unspecified site: Secondary | ICD-10-CM | POA: Diagnosis not present

## 2017-06-02 DIAGNOSIS — N184 Chronic kidney disease, stage 4 (severe): Secondary | ICD-10-CM | POA: Diagnosis not present

## 2017-06-02 DIAGNOSIS — N319 Neuromuscular dysfunction of bladder, unspecified: Secondary | ICD-10-CM | POA: Diagnosis not present

## 2017-06-02 DIAGNOSIS — I130 Hypertensive heart and chronic kidney disease with heart failure and stage 1 through stage 4 chronic kidney disease, or unspecified chronic kidney disease: Secondary | ICD-10-CM | POA: Diagnosis not present

## 2017-06-02 DIAGNOSIS — I2510 Atherosclerotic heart disease of native coronary artery without angina pectoris: Secondary | ICD-10-CM | POA: Diagnosis not present

## 2017-06-04 DIAGNOSIS — Z466 Encounter for fitting and adjustment of urinary device: Secondary | ICD-10-CM | POA: Diagnosis not present

## 2017-06-04 DIAGNOSIS — N184 Chronic kidney disease, stage 4 (severe): Secondary | ICD-10-CM | POA: Diagnosis not present

## 2017-06-04 DIAGNOSIS — I69320 Aphasia following cerebral infarction: Secondary | ICD-10-CM | POA: Diagnosis not present

## 2017-06-04 DIAGNOSIS — I69391 Dysphagia following cerebral infarction: Secondary | ICD-10-CM | POA: Diagnosis not present

## 2017-06-04 DIAGNOSIS — N319 Neuromuscular dysfunction of bladder, unspecified: Secondary | ICD-10-CM | POA: Diagnosis not present

## 2017-06-04 DIAGNOSIS — I5032 Chronic diastolic (congestive) heart failure: Secondary | ICD-10-CM | POA: Diagnosis not present

## 2017-06-04 DIAGNOSIS — M1990 Unspecified osteoarthritis, unspecified site: Secondary | ICD-10-CM | POA: Diagnosis not present

## 2017-06-04 DIAGNOSIS — D631 Anemia in chronic kidney disease: Secondary | ICD-10-CM | POA: Diagnosis not present

## 2017-06-04 DIAGNOSIS — J439 Emphysema, unspecified: Secondary | ICD-10-CM | POA: Diagnosis not present

## 2017-06-04 DIAGNOSIS — I130 Hypertensive heart and chronic kidney disease with heart failure and stage 1 through stage 4 chronic kidney disease, or unspecified chronic kidney disease: Secondary | ICD-10-CM | POA: Diagnosis not present

## 2017-06-04 DIAGNOSIS — I2510 Atherosclerotic heart disease of native coronary artery without angina pectoris: Secondary | ICD-10-CM | POA: Diagnosis not present

## 2017-06-04 DIAGNOSIS — E1122 Type 2 diabetes mellitus with diabetic chronic kidney disease: Secondary | ICD-10-CM | POA: Diagnosis not present

## 2017-06-04 DIAGNOSIS — I69351 Hemiplegia and hemiparesis following cerebral infarction affecting right dominant side: Secondary | ICD-10-CM | POA: Diagnosis not present

## 2017-06-05 DIAGNOSIS — I69320 Aphasia following cerebral infarction: Secondary | ICD-10-CM | POA: Diagnosis not present

## 2017-06-05 DIAGNOSIS — N184 Chronic kidney disease, stage 4 (severe): Secondary | ICD-10-CM | POA: Diagnosis not present

## 2017-06-05 DIAGNOSIS — I130 Hypertensive heart and chronic kidney disease with heart failure and stage 1 through stage 4 chronic kidney disease, or unspecified chronic kidney disease: Secondary | ICD-10-CM | POA: Diagnosis not present

## 2017-06-05 DIAGNOSIS — I2510 Atherosclerotic heart disease of native coronary artery without angina pectoris: Secondary | ICD-10-CM | POA: Diagnosis not present

## 2017-06-05 DIAGNOSIS — M1990 Unspecified osteoarthritis, unspecified site: Secondary | ICD-10-CM | POA: Diagnosis not present

## 2017-06-05 DIAGNOSIS — N319 Neuromuscular dysfunction of bladder, unspecified: Secondary | ICD-10-CM | POA: Diagnosis not present

## 2017-06-05 DIAGNOSIS — Z466 Encounter for fitting and adjustment of urinary device: Secondary | ICD-10-CM | POA: Diagnosis not present

## 2017-06-05 DIAGNOSIS — E1122 Type 2 diabetes mellitus with diabetic chronic kidney disease: Secondary | ICD-10-CM | POA: Diagnosis not present

## 2017-06-05 DIAGNOSIS — J439 Emphysema, unspecified: Secondary | ICD-10-CM | POA: Diagnosis not present

## 2017-06-05 DIAGNOSIS — D631 Anemia in chronic kidney disease: Secondary | ICD-10-CM | POA: Diagnosis not present

## 2017-06-05 DIAGNOSIS — I5032 Chronic diastolic (congestive) heart failure: Secondary | ICD-10-CM | POA: Diagnosis not present

## 2017-06-05 DIAGNOSIS — I69351 Hemiplegia and hemiparesis following cerebral infarction affecting right dominant side: Secondary | ICD-10-CM | POA: Diagnosis not present

## 2017-06-05 DIAGNOSIS — I69391 Dysphagia following cerebral infarction: Secondary | ICD-10-CM | POA: Diagnosis not present

## 2017-06-07 DIAGNOSIS — N319 Neuromuscular dysfunction of bladder, unspecified: Secondary | ICD-10-CM | POA: Diagnosis not present

## 2017-06-07 DIAGNOSIS — N184 Chronic kidney disease, stage 4 (severe): Secondary | ICD-10-CM | POA: Diagnosis not present

## 2017-06-07 DIAGNOSIS — I69391 Dysphagia following cerebral infarction: Secondary | ICD-10-CM | POA: Diagnosis not present

## 2017-06-07 DIAGNOSIS — Z466 Encounter for fitting and adjustment of urinary device: Secondary | ICD-10-CM | POA: Diagnosis not present

## 2017-06-07 DIAGNOSIS — J439 Emphysema, unspecified: Secondary | ICD-10-CM | POA: Diagnosis not present

## 2017-06-07 DIAGNOSIS — I69320 Aphasia following cerebral infarction: Secondary | ICD-10-CM | POA: Diagnosis not present

## 2017-06-07 DIAGNOSIS — I5032 Chronic diastolic (congestive) heart failure: Secondary | ICD-10-CM | POA: Diagnosis not present

## 2017-06-07 DIAGNOSIS — M1990 Unspecified osteoarthritis, unspecified site: Secondary | ICD-10-CM | POA: Diagnosis not present

## 2017-06-07 DIAGNOSIS — I130 Hypertensive heart and chronic kidney disease with heart failure and stage 1 through stage 4 chronic kidney disease, or unspecified chronic kidney disease: Secondary | ICD-10-CM | POA: Diagnosis not present

## 2017-06-07 DIAGNOSIS — I2510 Atherosclerotic heart disease of native coronary artery without angina pectoris: Secondary | ICD-10-CM | POA: Diagnosis not present

## 2017-06-07 DIAGNOSIS — D631 Anemia in chronic kidney disease: Secondary | ICD-10-CM | POA: Diagnosis not present

## 2017-06-07 DIAGNOSIS — I69351 Hemiplegia and hemiparesis following cerebral infarction affecting right dominant side: Secondary | ICD-10-CM | POA: Diagnosis not present

## 2017-06-07 DIAGNOSIS — E1122 Type 2 diabetes mellitus with diabetic chronic kidney disease: Secondary | ICD-10-CM | POA: Diagnosis not present

## 2017-06-09 DIAGNOSIS — J439 Emphysema, unspecified: Secondary | ICD-10-CM | POA: Diagnosis not present

## 2017-06-09 DIAGNOSIS — I2510 Atherosclerotic heart disease of native coronary artery without angina pectoris: Secondary | ICD-10-CM | POA: Diagnosis not present

## 2017-06-09 DIAGNOSIS — Z466 Encounter for fitting and adjustment of urinary device: Secondary | ICD-10-CM | POA: Diagnosis not present

## 2017-06-09 DIAGNOSIS — D631 Anemia in chronic kidney disease: Secondary | ICD-10-CM | POA: Diagnosis not present

## 2017-06-09 DIAGNOSIS — N319 Neuromuscular dysfunction of bladder, unspecified: Secondary | ICD-10-CM | POA: Diagnosis not present

## 2017-06-09 DIAGNOSIS — I69391 Dysphagia following cerebral infarction: Secondary | ICD-10-CM | POA: Diagnosis not present

## 2017-06-09 DIAGNOSIS — I5032 Chronic diastolic (congestive) heart failure: Secondary | ICD-10-CM | POA: Diagnosis not present

## 2017-06-09 DIAGNOSIS — I130 Hypertensive heart and chronic kidney disease with heart failure and stage 1 through stage 4 chronic kidney disease, or unspecified chronic kidney disease: Secondary | ICD-10-CM | POA: Diagnosis not present

## 2017-06-09 DIAGNOSIS — I69320 Aphasia following cerebral infarction: Secondary | ICD-10-CM | POA: Diagnosis not present

## 2017-06-09 DIAGNOSIS — I69351 Hemiplegia and hemiparesis following cerebral infarction affecting right dominant side: Secondary | ICD-10-CM | POA: Diagnosis not present

## 2017-06-09 DIAGNOSIS — N184 Chronic kidney disease, stage 4 (severe): Secondary | ICD-10-CM | POA: Diagnosis not present

## 2017-06-09 DIAGNOSIS — E1122 Type 2 diabetes mellitus with diabetic chronic kidney disease: Secondary | ICD-10-CM | POA: Diagnosis not present

## 2017-06-09 DIAGNOSIS — M1990 Unspecified osteoarthritis, unspecified site: Secondary | ICD-10-CM | POA: Diagnosis not present

## 2017-06-12 DIAGNOSIS — Z466 Encounter for fitting and adjustment of urinary device: Secondary | ICD-10-CM | POA: Diagnosis not present

## 2017-06-12 DIAGNOSIS — E1122 Type 2 diabetes mellitus with diabetic chronic kidney disease: Secondary | ICD-10-CM | POA: Diagnosis not present

## 2017-06-12 DIAGNOSIS — I69351 Hemiplegia and hemiparesis following cerebral infarction affecting right dominant side: Secondary | ICD-10-CM | POA: Diagnosis not present

## 2017-06-12 DIAGNOSIS — I5032 Chronic diastolic (congestive) heart failure: Secondary | ICD-10-CM | POA: Diagnosis not present

## 2017-06-12 DIAGNOSIS — I130 Hypertensive heart and chronic kidney disease with heart failure and stage 1 through stage 4 chronic kidney disease, or unspecified chronic kidney disease: Secondary | ICD-10-CM | POA: Diagnosis not present

## 2017-06-12 DIAGNOSIS — I2510 Atherosclerotic heart disease of native coronary artery without angina pectoris: Secondary | ICD-10-CM | POA: Diagnosis not present

## 2017-06-12 DIAGNOSIS — N184 Chronic kidney disease, stage 4 (severe): Secondary | ICD-10-CM | POA: Diagnosis not present

## 2017-06-12 DIAGNOSIS — M1990 Unspecified osteoarthritis, unspecified site: Secondary | ICD-10-CM | POA: Diagnosis not present

## 2017-06-12 DIAGNOSIS — N319 Neuromuscular dysfunction of bladder, unspecified: Secondary | ICD-10-CM | POA: Diagnosis not present

## 2017-06-12 DIAGNOSIS — D631 Anemia in chronic kidney disease: Secondary | ICD-10-CM | POA: Diagnosis not present

## 2017-06-12 DIAGNOSIS — I69391 Dysphagia following cerebral infarction: Secondary | ICD-10-CM | POA: Diagnosis not present

## 2017-06-12 DIAGNOSIS — J439 Emphysema, unspecified: Secondary | ICD-10-CM | POA: Diagnosis not present

## 2017-06-12 DIAGNOSIS — I69320 Aphasia following cerebral infarction: Secondary | ICD-10-CM | POA: Diagnosis not present

## 2017-06-17 ENCOUNTER — Telehealth: Payer: Self-pay | Admitting: Internal Medicine

## 2017-06-17 NOTE — Telephone Encounter (Signed)
Elmyra Ricks missed therapy this week, due unable to get in contact with patient

## 2017-06-18 DIAGNOSIS — N319 Neuromuscular dysfunction of bladder, unspecified: Secondary | ICD-10-CM | POA: Diagnosis not present

## 2017-06-18 DIAGNOSIS — N184 Chronic kidney disease, stage 4 (severe): Secondary | ICD-10-CM | POA: Diagnosis not present

## 2017-06-18 DIAGNOSIS — I130 Hypertensive heart and chronic kidney disease with heart failure and stage 1 through stage 4 chronic kidney disease, or unspecified chronic kidney disease: Secondary | ICD-10-CM | POA: Diagnosis not present

## 2017-06-18 DIAGNOSIS — J439 Emphysema, unspecified: Secondary | ICD-10-CM | POA: Diagnosis not present

## 2017-06-18 DIAGNOSIS — I5032 Chronic diastolic (congestive) heart failure: Secondary | ICD-10-CM | POA: Diagnosis not present

## 2017-06-18 DIAGNOSIS — I69391 Dysphagia following cerebral infarction: Secondary | ICD-10-CM | POA: Diagnosis not present

## 2017-06-18 DIAGNOSIS — I69320 Aphasia following cerebral infarction: Secondary | ICD-10-CM | POA: Diagnosis not present

## 2017-06-18 DIAGNOSIS — D631 Anemia in chronic kidney disease: Secondary | ICD-10-CM | POA: Diagnosis not present

## 2017-06-18 DIAGNOSIS — E1122 Type 2 diabetes mellitus with diabetic chronic kidney disease: Secondary | ICD-10-CM | POA: Diagnosis not present

## 2017-06-18 DIAGNOSIS — I69351 Hemiplegia and hemiparesis following cerebral infarction affecting right dominant side: Secondary | ICD-10-CM | POA: Diagnosis not present

## 2017-06-18 DIAGNOSIS — Z466 Encounter for fitting and adjustment of urinary device: Secondary | ICD-10-CM | POA: Diagnosis not present

## 2017-06-18 DIAGNOSIS — I2510 Atherosclerotic heart disease of native coronary artery without angina pectoris: Secondary | ICD-10-CM | POA: Diagnosis not present

## 2017-06-18 DIAGNOSIS — M1990 Unspecified osteoarthritis, unspecified site: Secondary | ICD-10-CM | POA: Diagnosis not present

## 2017-06-19 DIAGNOSIS — M1990 Unspecified osteoarthritis, unspecified site: Secondary | ICD-10-CM | POA: Diagnosis not present

## 2017-06-19 DIAGNOSIS — J439 Emphysema, unspecified: Secondary | ICD-10-CM | POA: Diagnosis not present

## 2017-06-19 DIAGNOSIS — Z466 Encounter for fitting and adjustment of urinary device: Secondary | ICD-10-CM | POA: Diagnosis not present

## 2017-06-19 DIAGNOSIS — I130 Hypertensive heart and chronic kidney disease with heart failure and stage 1 through stage 4 chronic kidney disease, or unspecified chronic kidney disease: Secondary | ICD-10-CM | POA: Diagnosis not present

## 2017-06-19 DIAGNOSIS — I2510 Atherosclerotic heart disease of native coronary artery without angina pectoris: Secondary | ICD-10-CM | POA: Diagnosis not present

## 2017-06-19 DIAGNOSIS — N184 Chronic kidney disease, stage 4 (severe): Secondary | ICD-10-CM | POA: Diagnosis not present

## 2017-06-19 DIAGNOSIS — I5032 Chronic diastolic (congestive) heart failure: Secondary | ICD-10-CM | POA: Diagnosis not present

## 2017-06-19 DIAGNOSIS — I69351 Hemiplegia and hemiparesis following cerebral infarction affecting right dominant side: Secondary | ICD-10-CM | POA: Diagnosis not present

## 2017-06-19 DIAGNOSIS — I69391 Dysphagia following cerebral infarction: Secondary | ICD-10-CM | POA: Diagnosis not present

## 2017-06-19 DIAGNOSIS — N319 Neuromuscular dysfunction of bladder, unspecified: Secondary | ICD-10-CM | POA: Diagnosis not present

## 2017-06-19 DIAGNOSIS — D631 Anemia in chronic kidney disease: Secondary | ICD-10-CM | POA: Diagnosis not present

## 2017-06-19 DIAGNOSIS — E1122 Type 2 diabetes mellitus with diabetic chronic kidney disease: Secondary | ICD-10-CM | POA: Diagnosis not present

## 2017-06-19 DIAGNOSIS — I69320 Aphasia following cerebral infarction: Secondary | ICD-10-CM | POA: Diagnosis not present

## 2017-06-21 DIAGNOSIS — J449 Chronic obstructive pulmonary disease, unspecified: Secondary | ICD-10-CM | POA: Diagnosis not present

## 2017-06-28 ENCOUNTER — Telehealth: Payer: Self-pay | Admitting: Internal Medicine

## 2017-06-28 DIAGNOSIS — D631 Anemia in chronic kidney disease: Secondary | ICD-10-CM | POA: Diagnosis not present

## 2017-06-28 DIAGNOSIS — N319 Neuromuscular dysfunction of bladder, unspecified: Secondary | ICD-10-CM | POA: Diagnosis not present

## 2017-06-28 DIAGNOSIS — I5032 Chronic diastolic (congestive) heart failure: Secondary | ICD-10-CM | POA: Diagnosis not present

## 2017-06-28 DIAGNOSIS — I130 Hypertensive heart and chronic kidney disease with heart failure and stage 1 through stage 4 chronic kidney disease, or unspecified chronic kidney disease: Secondary | ICD-10-CM | POA: Diagnosis not present

## 2017-06-28 DIAGNOSIS — I69391 Dysphagia following cerebral infarction: Secondary | ICD-10-CM | POA: Diagnosis not present

## 2017-06-28 DIAGNOSIS — Z466 Encounter for fitting and adjustment of urinary device: Secondary | ICD-10-CM | POA: Diagnosis not present

## 2017-06-28 DIAGNOSIS — M1990 Unspecified osteoarthritis, unspecified site: Secondary | ICD-10-CM | POA: Diagnosis not present

## 2017-06-28 DIAGNOSIS — N184 Chronic kidney disease, stage 4 (severe): Secondary | ICD-10-CM | POA: Diagnosis not present

## 2017-06-28 DIAGNOSIS — I69351 Hemiplegia and hemiparesis following cerebral infarction affecting right dominant side: Secondary | ICD-10-CM | POA: Diagnosis not present

## 2017-06-28 DIAGNOSIS — E1122 Type 2 diabetes mellitus with diabetic chronic kidney disease: Secondary | ICD-10-CM | POA: Diagnosis not present

## 2017-06-28 DIAGNOSIS — I69320 Aphasia following cerebral infarction: Secondary | ICD-10-CM | POA: Diagnosis not present

## 2017-06-28 DIAGNOSIS — I2510 Atherosclerotic heart disease of native coronary artery without angina pectoris: Secondary | ICD-10-CM | POA: Diagnosis not present

## 2017-06-28 DIAGNOSIS — J439 Emphysema, unspecified: Secondary | ICD-10-CM | POA: Diagnosis not present

## 2017-06-28 NOTE — Telephone Encounter (Signed)
Michelle Holmes from [employer], re-certification orders

## 2017-06-28 NOTE — Telephone Encounter (Signed)
[name] given VO for cath change and continue to monitor cath, indwelling. Do you agree?

## 2017-06-29 DIAGNOSIS — N319 Neuromuscular dysfunction of bladder, unspecified: Secondary | ICD-10-CM | POA: Diagnosis not present

## 2017-06-29 DIAGNOSIS — I69391 Dysphagia following cerebral infarction: Secondary | ICD-10-CM | POA: Diagnosis not present

## 2017-06-29 DIAGNOSIS — D631 Anemia in chronic kidney disease: Secondary | ICD-10-CM | POA: Diagnosis not present

## 2017-06-29 DIAGNOSIS — Z466 Encounter for fitting and adjustment of urinary device: Secondary | ICD-10-CM | POA: Diagnosis not present

## 2017-06-29 DIAGNOSIS — I2510 Atherosclerotic heart disease of native coronary artery without angina pectoris: Secondary | ICD-10-CM | POA: Diagnosis not present

## 2017-06-29 DIAGNOSIS — N184 Chronic kidney disease, stage 4 (severe): Secondary | ICD-10-CM | POA: Diagnosis not present

## 2017-06-29 DIAGNOSIS — I69351 Hemiplegia and hemiparesis following cerebral infarction affecting right dominant side: Secondary | ICD-10-CM | POA: Diagnosis not present

## 2017-06-29 DIAGNOSIS — I5032 Chronic diastolic (congestive) heart failure: Secondary | ICD-10-CM | POA: Diagnosis not present

## 2017-06-29 DIAGNOSIS — E1122 Type 2 diabetes mellitus with diabetic chronic kidney disease: Secondary | ICD-10-CM | POA: Diagnosis not present

## 2017-06-29 DIAGNOSIS — I69320 Aphasia following cerebral infarction: Secondary | ICD-10-CM | POA: Diagnosis not present

## 2017-06-29 DIAGNOSIS — J439 Emphysema, unspecified: Secondary | ICD-10-CM | POA: Diagnosis not present

## 2017-06-29 DIAGNOSIS — M1990 Unspecified osteoarthritis, unspecified site: Secondary | ICD-10-CM | POA: Diagnosis not present

## 2017-06-29 DIAGNOSIS — I130 Hypertensive heart and chronic kidney disease with heart failure and stage 1 through stage 4 chronic kidney disease, or unspecified chronic kidney disease: Secondary | ICD-10-CM | POA: Diagnosis not present

## 2017-06-29 NOTE — Telephone Encounter (Signed)
That is fine with me.

## 2017-07-01 ENCOUNTER — Telehealth: Payer: Self-pay | Admitting: *Deleted

## 2017-07-01 NOTE — Telephone Encounter (Signed)
Michelle Holmes, [facility] calls and would like to extend homecare 3 more weeks due to pt having to reschedule due to 3 deaths in family. VO 2x week for 3 weeks and ot for strength, safety, mobility, disease process Do you agree?

## 2017-07-01 NOTE — Telephone Encounter (Signed)
That is fine with me.

## 2017-07-02 ENCOUNTER — Encounter: Payer: Self-pay | Admitting: Internal Medicine

## 2017-07-05 ENCOUNTER — Emergency Department (HOSPITAL_COMMUNITY)
Admission: EM | Admit: 2017-07-05 | Discharge: 2017-07-06 | Disposition: A | Discharge status: Home / Self Care | Payer: Medicare Other | Attending: Emergency Medicine | Admitting: Emergency Medicine

## 2017-07-05 DIAGNOSIS — I130 Hypertensive heart and chronic kidney disease with heart failure and stage 1 through stage 4 chronic kidney disease, or unspecified chronic kidney disease: Secondary | ICD-10-CM | POA: Insufficient documentation

## 2017-07-05 DIAGNOSIS — N3090 Cystitis, unspecified without hematuria: Secondary | ICD-10-CM | POA: Insufficient documentation

## 2017-07-05 DIAGNOSIS — E1122 Type 2 diabetes mellitus with diabetic chronic kidney disease: Secondary | ICD-10-CM | POA: Diagnosis not present

## 2017-07-05 DIAGNOSIS — I2510 Atherosclerotic heart disease of native coronary artery without angina pectoris: Secondary | ICD-10-CM | POA: Diagnosis not present

## 2017-07-05 DIAGNOSIS — Z79899 Other long term (current) drug therapy: Secondary | ICD-10-CM | POA: Diagnosis not present

## 2017-07-05 DIAGNOSIS — D631 Anemia in chronic kidney disease: Secondary | ICD-10-CM | POA: Diagnosis not present

## 2017-07-05 DIAGNOSIS — J449 Chronic obstructive pulmonary disease, unspecified: Secondary | ICD-10-CM | POA: Diagnosis not present

## 2017-07-05 DIAGNOSIS — N184 Chronic kidney disease, stage 4 (severe): Secondary | ICD-10-CM | POA: Diagnosis not present

## 2017-07-05 DIAGNOSIS — I5032 Chronic diastolic (congestive) heart failure: Secondary | ICD-10-CM | POA: Insufficient documentation

## 2017-07-05 DIAGNOSIS — I69391 Dysphagia following cerebral infarction: Secondary | ICD-10-CM | POA: Diagnosis not present

## 2017-07-05 DIAGNOSIS — R300 Dysuria: Secondary | ICD-10-CM | POA: Diagnosis not present

## 2017-07-05 DIAGNOSIS — R309 Painful micturition, unspecified: Secondary | ICD-10-CM | POA: Diagnosis present

## 2017-07-05 DIAGNOSIS — J439 Emphysema, unspecified: Secondary | ICD-10-CM | POA: Diagnosis not present

## 2017-07-05 DIAGNOSIS — Z466 Encounter for fitting and adjustment of urinary device: Secondary | ICD-10-CM | POA: Diagnosis not present

## 2017-07-05 DIAGNOSIS — M1990 Unspecified osteoarthritis, unspecified site: Secondary | ICD-10-CM | POA: Diagnosis not present

## 2017-07-05 DIAGNOSIS — I10 Essential (primary) hypertension: Secondary | ICD-10-CM | POA: Diagnosis not present

## 2017-07-05 DIAGNOSIS — Z955 Presence of coronary angioplasty implant and graft: Secondary | ICD-10-CM | POA: Insufficient documentation

## 2017-07-05 DIAGNOSIS — N319 Neuromuscular dysfunction of bladder, unspecified: Secondary | ICD-10-CM | POA: Diagnosis not present

## 2017-07-05 DIAGNOSIS — I69351 Hemiplegia and hemiparesis following cerebral infarction affecting right dominant side: Secondary | ICD-10-CM | POA: Diagnosis not present

## 2017-07-05 DIAGNOSIS — R52 Pain, unspecified: Secondary | ICD-10-CM | POA: Diagnosis not present

## 2017-07-05 DIAGNOSIS — R110 Nausea: Secondary | ICD-10-CM | POA: Diagnosis not present

## 2017-07-05 DIAGNOSIS — I69320 Aphasia following cerebral infarction: Secondary | ICD-10-CM | POA: Diagnosis not present

## 2017-07-05 LAB — URINALYSIS, ROUTINE W REFLEX MICROSCOPIC
Bilirubin Urine: NEGATIVE
Glucose, UA: NEGATIVE mg/dL
Ketones, ur: NEGATIVE mg/dL
Nitrite: NEGATIVE
Protein, ur: 100 mg/dL — AB
Specific Gravity, Urine: 1.01 (ref 1.005–1.030)
WBC, UA: >50 WBC/hpf — ABNORMAL HIGH (ref 0–5)
pH: 8 (ref 5.0–8.0)

## 2017-07-05 MED ORDER — SODIUM CHLORIDE 0.9 % IV BOLUS: 500.0000 mL | Freq: Once | INTRAVENOUS | Status: DC

## 2017-07-05 MED ORDER — CEPHALEXIN 500 MG PO CAPS: 500.0000 mg | ORAL_CAPSULE | Freq: Two times a day (BID) | ORAL | 0 refills | Status: AC

## 2017-07-05 MED ORDER — STERILE WATER FOR INJECTION IJ SOLN
INTRAMUSCULAR | Status: AC
  Administered 2017-07-05: 2.1 mL
  Filled 2017-07-05: qty 10

## 2017-07-05 MED ORDER — CEFTRIAXONE SODIUM 1 G IJ SOLR
1.0000 g | Freq: Once | INTRAMUSCULAR | Status: AC
  Administered 2017-07-05: 1 g via INTRAMUSCULAR
  Filled 2017-07-05: qty 10

## 2017-07-05 MED ORDER — SODIUM CHLORIDE 0.9 % IV BOLUS
500.0000 mL | Freq: Once | INTRAVENOUS | Status: AC
  Administered 2017-07-05: 500 mL via INTRAVENOUS

## 2017-07-05 NOTE — ED Notes (Signed)
[facility] called for transport.  

## 2017-07-05 NOTE — ED Triage Notes (Signed)
Transported by [facility] from home--patient has foley catheter in place and daughter (caretaker) states that patient is experiencing pain around the catheter site and the bag "changed colors." +nausea.

## 2017-07-05 NOTE — ED Notes (Signed)
Bed: RQ41 Expected date:  Expected time:  Means of arrival:  Comments: EMS-catheter pain

## 2017-07-05 NOTE — ED Provider Notes (Signed)
[facility] DEPT Provider Note   CSN: [id number] Arrival date & time: 07/05/17  1853  History   Chief Complaint Chief Complaint  Patient presents with  . Urinary Tract Infection    HPI Michelle Holmes is a 67 y.o. female with a past medical history of diabetes, hypertension, chronic opiate use, hemiplegia and neurogenic bladder s/p CVA with chronic indwelling Foley catheter, CKD, obesity who presents to the ED with complaints of urinary issues.  She reports a 2 to 3-day history of a burning sensation with urination.  Her family member also notes concern due to the appearance of discharge in the urine has caused the catheter drainage bag to change color to purple.  Patient also notes nausea, though this has resolved. Family states foley was last changed about 3 weeks ago and has been functioning well with normal output. They deny fever, chills, chest pain, shortness of breath, vomiting, flank pain or change to her chronic back pain.  She has had recurrent issues with urinary symptoms due to her indwelling catheter, last completed an antibiotic course about 1 month ago. Reports her sx improved following the antibiotics.   Past Medical History:  Diagnosis Date  . Anemia   . CHF (congestive heart failure) ([place])   . Chronic back pain   . COPD (chronic obstructive pulmonary disease) ([place])   . Coronary artery disease   . Diabetes mellitus   . DJD (degenerative joint disease)   . Emphysema   . Hypercholesteremia   . Hypertension   . Myocardial infarction ([place])   . Obesity   . Obesity hypoventilation syndrome ([place])   . Renal insufficiency   . Scoliosis   . Sleep apnea   . Stroke ([place])   . TIA (transient ischemic attack)     Patient Active Problem List   Diagnosis Date Noted  . Stroke (cerebrum) ([place]) 08/04/2016  . Long-term current use of opiate analgesic 03/05/2016  . Essential hypertension 03/05/2016  . Chronic diastolic (congestive) heart failure  ([place]) 01/28/2016  . Protein-calorie malnutrition, severe 01/28/2016  . Healthcare maintenance   . Palliative care by specialist   . Anemia in other chronic diseases classified elsewhere   . Acute urinary retention   . Chronic kidney disease (CKD), stage IV (severe) ([place]) 07/06/2015  . Urinary tract infection associated with indwelling urethral catheter ([place]) 07/04/2015  . 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 Spastic hemiplegia affecting nondominant side ([place]) 07/01/2015  . Obesity, morbid ([place]) 07/01/2015  . Thrombocytopenia ([place])   . Dysarthria   . Lethargy   . HLD (hyperlipidemia)   . Chronic obstructive pulmonary disease ([place])   . Hemiparesis, aphasia, and dysphagia as late effect of cerebrovascular accident (CVA) ([place])   . Right hemiplegia ([place])   . OSA on CPAP 12/08/2010  . Morbid obesity ([place]) 12/08/2010  . CAD (coronary artery disease) 12/08/2010  . Controlled diabetes mellitus type 2 with complications ([place]) 24/40/1027    Past Surgical History:  Procedure Laterality Date  . bil uterscospy     08/18/16 Dr. Jeffie Pollock  . COLON SURGERY    . CORONARY ANGIOPLASTY WITH STENT PLACEMENT    . CYSTOSCOPY WITH STENT PLACEMENT Bilateral 08/04/2016   Procedure: CYSTOSCOPY WITH STENT PLACEMENT bilateral bilateral retrograde fecal disimpaction;  Surgeon: Ardis Hughs, MD;  Location: [facility];  Service: Urology;  Laterality: Bilateral;  . CYSTOSCOPY/URETEROSCOPY/HOLMIUM LASER/STENT PLACEMENT Bilateral 08/18/2016   Procedure: BILATERAL URETEROSCOPY WITH HOLMIUM LASER AND STENTS;  Surgeon: Irine Seal, MD;  Location:  [facility];  Service: Urology;  Laterality: Bilateral;     OB History   None      Home Medications    Prior to Admission medications   Medication Sig Start Date End Date Taking? Authorizing Provider  acetaminophen (TYLENOL) 500 MG tablet Take 500 mg by mouth every 6 (six) hours as needed for moderate pain.   Yes [provider]  amLODipine (NORVASC) 5 MG  tablet Take 5 mg by mouth daily.  05/03/17  Yes [provider]  apixaban (ELIQUIS) 2.5 MG TABS tablet Take 2.5 mg by mouth 2 (two) times daily.   Yes [provider]  LANTUS SOLOSTAR 100 UNIT/ML Solostar Pen Inject 10 Units into the skin at bedtime.  12/21/16  Yes [provider]  ondansetron (ZOFRAN) 4 MG tablet Take 1 tablet (4 mg total) by mouth daily as needed for nausea or vomiting. 05/28/17 05/28/18 Yes Lorella Nimrod, MD  Oxycodone HCl 10 MG TABS Take 1 tablet (10 mg total) by mouth 3 (three) times daily. 05/28/17  Yes Lorella Nimrod, MD  amLODipine (NORVASC) 10 MG tablet Take 1 tablet (10 mg total) by mouth daily. Patient not taking: Reported on 07/05/2017 03/05/17 03/05/18  Lorella Nimrod, MD  apixaban (ELIQUIS) 2.5 MG TABS tablet Take 1 tablet (2.5 mg total) by mouth 2 (two) times daily. 08/22/16 10/22/16  Filippou, Braxton Feathers, MD  cephALEXin (KEFLEX) 500 MG capsule Take 1 capsule (500 mg total) by mouth 2 (two) times daily for 5 days. 07/05/17 07/10/17  Tawny Asal, MD  [place] Please provide patient with adult diapers and pads 06/03/16   Ophelia Shoulder, MD  erythromycin ophthalmic ointment Place a 1/2 inch ribbon of ointment into the lower eyelid every 6 hours. Patient not taking: Reported on 07/05/2017 01/10/17   Charlesetta Shanks, MD  Vitamin D, Ergocalciferol, (DRISDOL) 50000 units CAPS capsule Take 1 capsule (50,000 Units total) by mouth every 7 (seven) days. 8 weeks. 03/06/17   Lorella Nimrod, MD    Family History Family History  Problem Relation Age of Onset  . Diabetes Mother   . Stroke Maternal Aunt     Social History Social History   Tobacco Use  . Smoking status: Never Smoker  . Smokeless tobacco: Never Used  Substance Use Topics  . Alcohol use: No  . Drug use: No     Allergies   Penicillins   Review of Systems Review of Systems  Constitutional: Negative for chills and fever.  Respiratory: Negative for shortness of breath.     Cardiovascular: Negative for chest pain.  Gastrointestinal: Positive for nausea. Negative for vomiting.  Genitourinary: Positive for dysuria. Negative for decreased urine volume and flank pain.  Psychiatric/Behavioral: Negative for behavioral problems and confusion.     Physical Exam Updated Vital Signs BP (!) 166/104   Pulse 73   Temp 98.3 F (36.8 C) (Oral)   Resp 17   SpO2 99%   General: Chronically ill appearing, no acute distress Head: Normocephalic, atraumatic Eyes: Normal conjuctiva, EOMI ENT: Moist mucus membranes CV: RRR, no murmur appreciated  Resp: Clear anterior breath sounds, normal work of breathing, no distress  Abd: Soft, +BS, suprapubic tenderness to palpation, otherwise non-tender, obesity  Extr: Chronic contracture of RUE Neuro: Alert and oriented, follows commands,  Skin: Warm, dry      ED Treatments / Results  Labs (all labs ordered are listed, but only abnormal results are displayed) Labs Reviewed  URINALYSIS, ROUTINE W REFLEX MICROSCOPIC - Abnormal; Notable for  the following components:      Result Value   APPearance CLOUDY (*)    Hgb urine dipstick SMALL (*)    Protein, ur 100 (*)    Leukocytes, UA MODERATE (*)    WBC, UA >50 (*)    Bacteria, UA MANY (*)    All other components within normal limits  URINE CULTURE    EKG None  Radiology No results found.  Procedures Procedures (including critical care time)  Medications Ordered in ED Medications  cefTRIAXone (ROCEPHIN) injection 1 g (has no administration in time range)     Initial Impression / Assessment and Plan / ED Course  I have reviewed the triage vital signs and the nursing notes.  Pertinent labs & imaging results that were available during my care of the patient were reviewed by me and considered in my medical decision making (see chart for details).  67 year old female with chronic indwelling catheter and recurrent UTI presenting with reports of dysuria and suprapubic  tenderness on exam.  Patient's family reports good function of Foley catheter, last changed about 3 weeks ago and is due for another change in about 1 week.  She had intermittent courses of antibiotics with Keflex for the last 2 courses based on prior culture data (12/2016). Though she likely has chronic colonization, she is currently reporting sx consistent with an active urinary tract infection. Will collect urine for U/A and culture. U/A with leukocytes, many bacteria on micro though negative nitrite. Given overall clinical picture with symptoms will elect to treat as urinary tract infection.  Will give  dose of Rocephin in the ED prescribe a course of Keflex for 5 days. Will arrange for follow-up in outpatient clinic.  Penicillin allergy documented, however patient has tolerated Keflex and cephalosporins on previous encounters.  Final Clinical Impressions(s) / ED Diagnoses   Final diagnoses:  Cystitis    ED Discharge Orders        Ordered    cephALEXin (KEFLEX) 500 MG capsule  2 times daily     07/05/17 2100       Tawny Asal, MD 07/05/17 0981    Milton Ferguson, MD 07/06/17 1112

## 2017-07-05 NOTE — Discharge Instructions (Signed)
Nice to see you Ms. Pester.  We are going to treat for another urinary tract infection with a shot of antibiotics and then antibiotic pills that you will take twice a day starting tomorrow until they run out. Change your foley catheter as normally scheduled. I am going to send a message to the [facility] clinic to get you a follow up appointment in about a week to see how you are doing after the antibiotics. The clinic number is [phone].

## 2017-07-06 DIAGNOSIS — Z7401 Bed confinement status: Secondary | ICD-10-CM | POA: Diagnosis not present

## 2017-07-06 DIAGNOSIS — M2550 Pain in unspecified joint: Secondary | ICD-10-CM | POA: Diagnosis not present

## 2017-07-07 LAB — URINE CULTURE

## 2017-07-08 ENCOUNTER — Telehealth: Payer: Self-pay | Admitting: Emergency Medicine

## 2017-07-08 DIAGNOSIS — I69351 Hemiplegia and hemiparesis following cerebral infarction affecting right dominant side: Secondary | ICD-10-CM | POA: Diagnosis not present

## 2017-07-08 DIAGNOSIS — N319 Neuromuscular dysfunction of bladder, unspecified: Secondary | ICD-10-CM | POA: Diagnosis not present

## 2017-07-08 DIAGNOSIS — I69391 Dysphagia following cerebral infarction: Secondary | ICD-10-CM | POA: Diagnosis not present

## 2017-07-08 DIAGNOSIS — I5032 Chronic diastolic (congestive) heart failure: Secondary | ICD-10-CM | POA: Diagnosis not present

## 2017-07-08 DIAGNOSIS — I2510 Atherosclerotic heart disease of native coronary artery without angina pectoris: Secondary | ICD-10-CM | POA: Diagnosis not present

## 2017-07-08 DIAGNOSIS — I130 Hypertensive heart and chronic kidney disease with heart failure and stage 1 through stage 4 chronic kidney disease, or unspecified chronic kidney disease: Secondary | ICD-10-CM | POA: Diagnosis not present

## 2017-07-08 DIAGNOSIS — E1122 Type 2 diabetes mellitus with diabetic chronic kidney disease: Secondary | ICD-10-CM | POA: Diagnosis not present

## 2017-07-08 DIAGNOSIS — J439 Emphysema, unspecified: Secondary | ICD-10-CM | POA: Diagnosis not present

## 2017-07-08 DIAGNOSIS — M1990 Unspecified osteoarthritis, unspecified site: Secondary | ICD-10-CM | POA: Diagnosis not present

## 2017-07-08 DIAGNOSIS — D631 Anemia in chronic kidney disease: Secondary | ICD-10-CM | POA: Diagnosis not present

## 2017-07-08 DIAGNOSIS — Z466 Encounter for fitting and adjustment of urinary device: Secondary | ICD-10-CM | POA: Diagnosis not present

## 2017-07-08 DIAGNOSIS — I69320 Aphasia following cerebral infarction: Secondary | ICD-10-CM | POA: Diagnosis not present

## 2017-07-08 DIAGNOSIS — N184 Chronic kidney disease, stage 4 (severe): Secondary | ICD-10-CM | POA: Diagnosis not present

## 2017-07-08 NOTE — Telephone Encounter (Signed)
Post ED Visit - Positive Culture Follow-up  Culture report reviewed by antimicrobial stewardship pharmacist:  []  Elenor Quinones, Pharm.D. []  Heide Guile, Pharm.D., BCPS AQ-ID []  Parks Neptune, Pharm.D., BCPS []  Alycia Rossetti, Pharm.D., BCPS []  [place], Pharm.D., BCPS, AAHIVP []  Legrand Como, Pharm.D., BCPS, AAHIVP []  Salome Arnt, PharmD, BCPS []  Wynell Balloon, PharmD []  Vincenza Hews, PharmD, BCPS [facility] PharmD  Positive urine culture Treated with cephalexin, organism sensitive to the same and no further patient follow-up is required at this time.  Hazle Nordmann 07/08/2017, 4:30 PM

## 2017-07-09 ENCOUNTER — Telehealth: Payer: Self-pay | Admitting: Internal Medicine

## 2017-07-09 DIAGNOSIS — I69320 Aphasia following cerebral infarction: Secondary | ICD-10-CM | POA: Diagnosis not present

## 2017-07-09 DIAGNOSIS — I69351 Hemiplegia and hemiparesis following cerebral infarction affecting right dominant side: Secondary | ICD-10-CM | POA: Diagnosis not present

## 2017-07-09 DIAGNOSIS — I69391 Dysphagia following cerebral infarction: Secondary | ICD-10-CM | POA: Diagnosis not present

## 2017-07-09 DIAGNOSIS — M1990 Unspecified osteoarthritis, unspecified site: Secondary | ICD-10-CM | POA: Diagnosis not present

## 2017-07-09 DIAGNOSIS — N184 Chronic kidney disease, stage 4 (severe): Secondary | ICD-10-CM | POA: Diagnosis not present

## 2017-07-09 DIAGNOSIS — J439 Emphysema, unspecified: Secondary | ICD-10-CM | POA: Diagnosis not present

## 2017-07-09 DIAGNOSIS — I2510 Atherosclerotic heart disease of native coronary artery without angina pectoris: Secondary | ICD-10-CM | POA: Diagnosis not present

## 2017-07-09 DIAGNOSIS — D631 Anemia in chronic kidney disease: Secondary | ICD-10-CM | POA: Diagnosis not present

## 2017-07-09 DIAGNOSIS — N319 Neuromuscular dysfunction of bladder, unspecified: Secondary | ICD-10-CM | POA: Diagnosis not present

## 2017-07-09 DIAGNOSIS — I130 Hypertensive heart and chronic kidney disease with heart failure and stage 1 through stage 4 chronic kidney disease, or unspecified chronic kidney disease: Secondary | ICD-10-CM | POA: Diagnosis not present

## 2017-07-09 DIAGNOSIS — I5032 Chronic diastolic (congestive) heart failure: Secondary | ICD-10-CM | POA: Diagnosis not present

## 2017-07-09 DIAGNOSIS — E1122 Type 2 diabetes mellitus with diabetic chronic kidney disease: Secondary | ICD-10-CM | POA: Diagnosis not present

## 2017-07-09 DIAGNOSIS — Z466 Encounter for fitting and adjustment of urinary device: Secondary | ICD-10-CM | POA: Diagnosis not present

## 2017-07-13 DIAGNOSIS — I69391 Dysphagia following cerebral infarction: Secondary | ICD-10-CM | POA: Diagnosis not present

## 2017-07-13 DIAGNOSIS — I69320 Aphasia following cerebral infarction: Secondary | ICD-10-CM | POA: Diagnosis not present

## 2017-07-13 DIAGNOSIS — I5032 Chronic diastolic (congestive) heart failure: Secondary | ICD-10-CM | POA: Diagnosis not present

## 2017-07-13 DIAGNOSIS — D631 Anemia in chronic kidney disease: Secondary | ICD-10-CM | POA: Diagnosis not present

## 2017-07-13 DIAGNOSIS — I69351 Hemiplegia and hemiparesis following cerebral infarction affecting right dominant side: Secondary | ICD-10-CM | POA: Diagnosis not present

## 2017-07-13 DIAGNOSIS — N319 Neuromuscular dysfunction of bladder, unspecified: Secondary | ICD-10-CM | POA: Diagnosis not present

## 2017-07-13 DIAGNOSIS — E1122 Type 2 diabetes mellitus with diabetic chronic kidney disease: Secondary | ICD-10-CM | POA: Diagnosis not present

## 2017-07-13 DIAGNOSIS — M1990 Unspecified osteoarthritis, unspecified site: Secondary | ICD-10-CM | POA: Diagnosis not present

## 2017-07-13 DIAGNOSIS — N184 Chronic kidney disease, stage 4 (severe): Secondary | ICD-10-CM | POA: Diagnosis not present

## 2017-07-13 DIAGNOSIS — I2510 Atherosclerotic heart disease of native coronary artery without angina pectoris: Secondary | ICD-10-CM | POA: Diagnosis not present

## 2017-07-13 DIAGNOSIS — I130 Hypertensive heart and chronic kidney disease with heart failure and stage 1 through stage 4 chronic kidney disease, or unspecified chronic kidney disease: Secondary | ICD-10-CM | POA: Diagnosis not present

## 2017-07-13 DIAGNOSIS — J439 Emphysema, unspecified: Secondary | ICD-10-CM | POA: Diagnosis not present

## 2017-07-13 DIAGNOSIS — Z466 Encounter for fitting and adjustment of urinary device: Secondary | ICD-10-CM | POA: Diagnosis not present

## 2017-07-14 DIAGNOSIS — N319 Neuromuscular dysfunction of bladder, unspecified: Secondary | ICD-10-CM | POA: Diagnosis not present

## 2017-07-15 ENCOUNTER — Encounter (INDEPENDENT_AMBULATORY_CARE_PROVIDER_SITE_OTHER): Payer: Self-pay

## 2017-07-15 ENCOUNTER — Ambulatory Visit (INDEPENDENT_AMBULATORY_CARE_PROVIDER_SITE_OTHER): Payer: Medicare Other | Admitting: Internal Medicine

## 2017-07-15 ENCOUNTER — Encounter: Payer: Self-pay | Admitting: Internal Medicine

## 2017-07-15 DIAGNOSIS — T83511D Infection and inflammatory reaction due to indwelling urethral catheter, subsequent encounter: Secondary | ICD-10-CM | POA: Diagnosis not present

## 2017-07-15 DIAGNOSIS — Z960 Presence of urogenital implants: Secondary | ICD-10-CM | POA: Diagnosis not present

## 2017-07-15 DIAGNOSIS — Z87440 Personal history of urinary (tract) infections: Secondary | ICD-10-CM | POA: Diagnosis not present

## 2017-07-15 DIAGNOSIS — N390 Urinary tract infection, site not specified: Secondary | ICD-10-CM

## 2017-07-15 NOTE — Assessment & Plan Note (Signed)
See HPI for full details  She is doing well and has been adequately treated for UTI.  Discussed contacting the clinic if she has any further symptoms or concerns for UTI.  We should be able to either send an antibiotic or have her seen in clinic for assessment and avoid ED visits.

## 2017-07-15 NOTE — Patient Instructions (Addendum)
Ms. Acri,  You are feeling better.  Your Foley does predispose you to getting these urinary tract infections.  If you start to notice that you are having symptoms and feel as if you are getting another infection please call the clinic and we can take care of it without having to go to the emergency department.  Please follow-up with your regular physician on 07/26/2017.

## 2017-07-15 NOTE — Progress Notes (Signed)
Medicine attending: Medical history, presenting problems, physical findings, and medications, reviewed with resident physician Dr Nathan Boswell on the day of the patient visit and I concur with his evaluation and management plan. 

## 2017-07-15 NOTE — Progress Notes (Signed)
   CC: ED follow-up for cystitis  HPI:  [name] is a 67 y.o. female with a past medical history listed below here today for follow up of recent ED visit on 07/05/2017 for cystitis.  She has a chronic indwelling catheter and has recurrent UTIs.  She was having dysuria and suprapubic tenderness.  Her Foley at that time had been changed approximately 3 weeks prior and she has monthly changes of her Foley.  She has recurrent UTIs with intermittent courses of Keflex based on culture data.  She received a dose of Rocephin in the ED and was prescribed a 5-day course of Keflex.  Urine culture from that visit grew multiple organisms without any predominant species.  Prior urine cultures have grown E. coli resistant to Augmentin, Cipro, Bactrim but otherwise sensitive and has been treated with Keflex.  She presents to clinic today accompanied by her daughter who is her primary caregiver.  She reports that after finishing the course of antibiotics she is done quite well.  She has not had any fevers, chills, nausea, vomiting, suprapubic tenderness, decreased appetite, CVA tenderness.  Past Medical History:  Diagnosis Date  . Anemia   . CHF (congestive heart failure) ([place])   . Chronic back pain   . COPD (chronic obstructive pulmonary disease) ([place])   . Coronary artery disease   . Diabetes mellitus   . DJD (degenerative joint disease)   . Emphysema   . Hypercholesteremia   . Hypertension   . Myocardial infarction ([place])   . Obesity   . Obesity hypoventilation syndrome ([place])   . Renal insufficiency   . Scoliosis   . Sleep apnea   . Stroke ([place])   . TIA (transient ischemic attack)    Review of Systems:   Negative except as noted in HPI  Physical Exam:  Vitals:   07/15/17 1017  BP: 120/61  Pulse: 69  Temp: 98 F (36.7 C)  TempSrc: Oral  SpO2: 100%  Height: 5\' 2"  (1.575 m)   GENERAL- alert, co-operative, chronically ill-appearing female in no acute distress. HEENT- Atraumatic,  normocephalict CARDIAC- RRR, no murmurs, rubs or gallops. RESP- Cear to auscultation bilaterally, no wheezes or crackles. ABDOMEN- Soft, nontender, bowel sounds present.  Suprapubic catheter in place EXTREMITIES- pulse 2+, symmetric, no pedal edema. SKIN- Warm, dry, No rash or lesion. PSYCH- Normal mood and affect, appropriate thought content and speech.   Assessment & Plan:   See Encounters Tab for problem based charting.  Patient discussed with Dr. Beryle Beams

## 2017-07-16 DIAGNOSIS — N184 Chronic kidney disease, stage 4 (severe): Secondary | ICD-10-CM | POA: Diagnosis not present

## 2017-07-16 DIAGNOSIS — I69391 Dysphagia following cerebral infarction: Secondary | ICD-10-CM | POA: Diagnosis not present

## 2017-07-16 DIAGNOSIS — I69320 Aphasia following cerebral infarction: Secondary | ICD-10-CM | POA: Diagnosis not present

## 2017-07-16 DIAGNOSIS — J439 Emphysema, unspecified: Secondary | ICD-10-CM | POA: Diagnosis not present

## 2017-07-16 DIAGNOSIS — E1122 Type 2 diabetes mellitus with diabetic chronic kidney disease: Secondary | ICD-10-CM | POA: Diagnosis not present

## 2017-07-16 DIAGNOSIS — I69351 Hemiplegia and hemiparesis following cerebral infarction affecting right dominant side: Secondary | ICD-10-CM | POA: Diagnosis not present

## 2017-07-16 DIAGNOSIS — Z466 Encounter for fitting and adjustment of urinary device: Secondary | ICD-10-CM | POA: Diagnosis not present

## 2017-07-16 DIAGNOSIS — N319 Neuromuscular dysfunction of bladder, unspecified: Secondary | ICD-10-CM | POA: Diagnosis not present

## 2017-07-16 DIAGNOSIS — I130 Hypertensive heart and chronic kidney disease with heart failure and stage 1 through stage 4 chronic kidney disease, or unspecified chronic kidney disease: Secondary | ICD-10-CM | POA: Diagnosis not present

## 2017-07-16 DIAGNOSIS — M1990 Unspecified osteoarthritis, unspecified site: Secondary | ICD-10-CM | POA: Diagnosis not present

## 2017-07-16 DIAGNOSIS — I5032 Chronic diastolic (congestive) heart failure: Secondary | ICD-10-CM | POA: Diagnosis not present

## 2017-07-16 DIAGNOSIS — I2510 Atherosclerotic heart disease of native coronary artery without angina pectoris: Secondary | ICD-10-CM | POA: Diagnosis not present

## 2017-07-16 DIAGNOSIS — D631 Anemia in chronic kidney disease: Secondary | ICD-10-CM | POA: Diagnosis not present

## 2017-07-22 DIAGNOSIS — M1990 Unspecified osteoarthritis, unspecified site: Secondary | ICD-10-CM | POA: Diagnosis not present

## 2017-07-22 DIAGNOSIS — D631 Anemia in chronic kidney disease: Secondary | ICD-10-CM | POA: Diagnosis not present

## 2017-07-22 DIAGNOSIS — J439 Emphysema, unspecified: Secondary | ICD-10-CM | POA: Diagnosis not present

## 2017-07-22 DIAGNOSIS — I5032 Chronic diastolic (congestive) heart failure: Secondary | ICD-10-CM | POA: Diagnosis not present

## 2017-07-22 DIAGNOSIS — I69351 Hemiplegia and hemiparesis following cerebral infarction affecting right dominant side: Secondary | ICD-10-CM | POA: Diagnosis not present

## 2017-07-22 DIAGNOSIS — I69320 Aphasia following cerebral infarction: Secondary | ICD-10-CM | POA: Diagnosis not present

## 2017-07-22 DIAGNOSIS — I69391 Dysphagia following cerebral infarction: Secondary | ICD-10-CM | POA: Diagnosis not present

## 2017-07-22 DIAGNOSIS — J449 Chronic obstructive pulmonary disease, unspecified: Secondary | ICD-10-CM | POA: Diagnosis not present

## 2017-07-22 DIAGNOSIS — E1122 Type 2 diabetes mellitus with diabetic chronic kidney disease: Secondary | ICD-10-CM | POA: Diagnosis not present

## 2017-07-22 DIAGNOSIS — I130 Hypertensive heart and chronic kidney disease with heart failure and stage 1 through stage 4 chronic kidney disease, or unspecified chronic kidney disease: Secondary | ICD-10-CM | POA: Diagnosis not present

## 2017-07-22 DIAGNOSIS — Z466 Encounter for fitting and adjustment of urinary device: Secondary | ICD-10-CM | POA: Diagnosis not present

## 2017-07-22 DIAGNOSIS — I2510 Atherosclerotic heart disease of native coronary artery without angina pectoris: Secondary | ICD-10-CM | POA: Diagnosis not present

## 2017-07-22 DIAGNOSIS — N319 Neuromuscular dysfunction of bladder, unspecified: Secondary | ICD-10-CM | POA: Diagnosis not present

## 2017-07-22 DIAGNOSIS — N184 Chronic kidney disease, stage 4 (severe): Secondary | ICD-10-CM | POA: Diagnosis not present

## 2017-07-26 ENCOUNTER — Encounter: Payer: Self-pay | Admitting: Internal Medicine

## 2017-07-26 ENCOUNTER — Other Ambulatory Visit: Payer: Self-pay

## 2017-07-26 ENCOUNTER — Ambulatory Visit (INDEPENDENT_AMBULATORY_CARE_PROVIDER_SITE_OTHER): Payer: Medicare Other | Admitting: Internal Medicine

## 2017-07-26 VITALS — BP 128/92 | HR 76 | Temp 98.5°F | Ht 62.0 in

## 2017-07-26 DIAGNOSIS — N390 Urinary tract infection, site not specified: Secondary | ICD-10-CM

## 2017-07-26 DIAGNOSIS — Z87440 Personal history of urinary (tract) infections: Secondary | ICD-10-CM | POA: Diagnosis not present

## 2017-07-26 DIAGNOSIS — Z79899 Other long term (current) drug therapy: Secondary | ICD-10-CM

## 2017-07-26 DIAGNOSIS — S93491A Sprain of other ligament of right ankle, initial encounter: Secondary | ICD-10-CM | POA: Diagnosis not present

## 2017-07-26 DIAGNOSIS — M6240 Contracture of muscle, unspecified site: Secondary | ICD-10-CM

## 2017-07-26 DIAGNOSIS — X58XXXA Exposure to other specified factors, initial encounter: Secondary | ICD-10-CM | POA: Diagnosis not present

## 2017-07-26 DIAGNOSIS — E118 Type 2 diabetes mellitus with unspecified complications: Secondary | ICD-10-CM

## 2017-07-26 DIAGNOSIS — G8191 Hemiplegia, unspecified affecting right dominant side: Secondary | ICD-10-CM | POA: Diagnosis not present

## 2017-07-26 DIAGNOSIS — I10 Essential (primary) hypertension: Secondary | ICD-10-CM | POA: Diagnosis not present

## 2017-07-26 DIAGNOSIS — Z79891 Long term (current) use of opiate analgesic: Secondary | ICD-10-CM

## 2017-07-26 DIAGNOSIS — Z794 Long term (current) use of insulin: Secondary | ICD-10-CM

## 2017-07-26 DIAGNOSIS — Z960 Presence of urogenital implants: Secondary | ICD-10-CM | POA: Diagnosis not present

## 2017-07-26 DIAGNOSIS — T83511D Infection and inflammatory reaction due to indwelling urethral catheter, subsequent encounter: Secondary | ICD-10-CM

## 2017-07-26 DIAGNOSIS — S93401A Sprain of unspecified ligament of right ankle, initial encounter: Secondary | ICD-10-CM

## 2017-07-26 MED ORDER — OXYCODONE HCL 10 MG PO TABS: 10.0000 mg | ORAL_TABLET | Freq: Three times a day (TID) | ORAL | 0 refills | Status: DC

## 2017-07-26 NOTE — Patient Instructions (Addendum)
Thank you for visiting clinic today. I did send a prescription of oxycodone to your pharmacy. Try taking your oxycodone 3 times a day for the next few days to help with your ankle pain. Try using some Ace bandage around her ankle and see if that will help. You can try some icing or over-the-counter rubbing stuff to help with that pain. Please call our clinic if your pain fail to resolve with the next few days or get worse.

## 2017-07-26 NOTE — Progress Notes (Signed)
   CC: Pain and swelling around medial malleolus.  HPI:  17 Michelle Holmes is a 67 y.o. with past medical history listed below came to the clinic with complaint of pain and swelling around right medial malleolus for the past few days.  Patient does not recall any injury.  Pain increased with any movement around the ankle.  Patient denies any erythema or fever.  Please see assessment and plan for her chronic conditions.  Past Medical History:  Diagnosis Date  . Anemia   . CHF (congestive heart failure) ([place])   . Chronic back pain   . COPD (chronic obstructive pulmonary disease) ([place])   . Coronary artery disease   . Diabetes mellitus   . DJD (degenerative joint disease)   . Emphysema   . Hypercholesteremia   . Hypertension   . Myocardial infarction ([place])   . Obesity   . Obesity hypoventilation syndrome ([place])   . Renal insufficiency   . Scoliosis   . Sleep apnea   . Stroke ([place])   . TIA (transient ischemic attack)    Review of Systems: Negative except mentioned in HPI.  Physical Exam:  Vitals:   07/26/17 1513  BP: (!) 128/92  Pulse: 76  Temp: 98.5 F (36.9 C)  TempSrc: Oral  SpO2: 100%  Height: 5\' 2"  (1.575 m)   General: Vital signs reviewed.  Patient is well-developed, in no acute distress and cooperative with exam.  Head: Normocephalic and atraumatic. Eyes: EOMI, conjunctivae normal, no scleral icterus.  Cardiovascular: RRR, S1 normal, S2 normal, no murmurs, gallops, or rubs. Pulmonary/Chest: Clear to auscultation bilaterally, no wheezes, rales, or rhonchi. Abdominal: Soft, non-tender, non-distended, BS + Musculoskeletal: Mild edema right around medial malleolus, restricted range of motion around ankle. Extremities: No lower extremity edema bilaterally,  pulses symmetric and intact bilaterally. No cyanosis or clubbing. Neurological: A&O x3,  right hemiparesis with some contractures. Skin: Warm, dry and intact. No rashes or erythema. Psychiatric: Normal mood and  affect. speech and behavior is normal. Cognition and memory are normal.  Assessment & Plan:   See Encounters Tab for problem based charting.  Patient discussed with Dr. Daryll Drown.

## 2017-07-28 LAB — GLUCOSE, CAPILLARY: Glucose-Capillary: 106 mg/dL — ABNORMAL HIGH (ref 70–99)

## 2017-07-29 DIAGNOSIS — S93401A Sprain of unspecified ligament of right ankle, initial encounter: Secondary | ICD-10-CM | POA: Insufficient documentation

## 2017-07-29 NOTE — Assessment & Plan Note (Signed)
She continued to get recurrent urinary tract infection with her chronic indwelling catheter. Last ED visit for the same reason was on July 05, 2017. He did completed her course of antibiotic.  Currently she was asymptomatic and denies any urinary symptoms.

## 2017-07-29 NOTE — Assessment & Plan Note (Signed)
BP Readings from Last 3 Encounters:  07/26/17 (!) 128/92  07/15/17 120/61  07/05/17 (!) 145/94   Blood pressure was within goal. Patient is compliant with amlodipine 5 mg daily.

## 2017-07-29 NOTE — Assessment & Plan Note (Signed)
Her symptoms and exam are more consistent with a right ankle sprain.  She was advised to take an extra pill of oxycodone but if needed. He can try Ace wrap, icing or heating pad according to her choice to help with pain.  Follow-up if her symptoms get worse or fail to resolve with the next few days.

## 2017-07-29 NOTE — Assessment & Plan Note (Signed)
Her last oxycodone prescription was filled 2 months ago, still having more than 10 tablets in the bottle. Not daughter is in charge of her pain meds and only giving her as needed. Most of the days she does need 1 tablet. Currently taking more because of right ankle pain. Database was checked and it was appropriate.  A new prescription for 90 tablets was given.

## 2017-07-29 NOTE — Assessment & Plan Note (Signed)
She was accompanied by her daughter. She is diet-controlled now. Last A1c done in May 2019 was 5.1.  She was encouraged to follow her diet so she will not need any medication.

## 2017-08-02 NOTE — Progress Notes (Signed)
Internal Medicine Clinic Attending  Case discussed with Dr. Amin at the time of the visit.  We reviewed the resident's history and exam and pertinent patient test results.  I agree with the assessment, diagnosis, and plan of care documented in the resident's note.    

## 2017-08-11 DIAGNOSIS — I69320 Aphasia following cerebral infarction: Secondary | ICD-10-CM | POA: Diagnosis not present

## 2017-08-11 DIAGNOSIS — J439 Emphysema, unspecified: Secondary | ICD-10-CM | POA: Diagnosis not present

## 2017-08-11 DIAGNOSIS — I2510 Atherosclerotic heart disease of native coronary artery without angina pectoris: Secondary | ICD-10-CM | POA: Diagnosis not present

## 2017-08-11 DIAGNOSIS — E1122 Type 2 diabetes mellitus with diabetic chronic kidney disease: Secondary | ICD-10-CM | POA: Diagnosis not present

## 2017-08-11 DIAGNOSIS — I69351 Hemiplegia and hemiparesis following cerebral infarction affecting right dominant side: Secondary | ICD-10-CM | POA: Diagnosis not present

## 2017-08-11 DIAGNOSIS — I130 Hypertensive heart and chronic kidney disease with heart failure and stage 1 through stage 4 chronic kidney disease, or unspecified chronic kidney disease: Secondary | ICD-10-CM | POA: Diagnosis not present

## 2017-08-11 DIAGNOSIS — Z466 Encounter for fitting and adjustment of urinary device: Secondary | ICD-10-CM | POA: Diagnosis not present

## 2017-08-11 DIAGNOSIS — D631 Anemia in chronic kidney disease: Secondary | ICD-10-CM | POA: Diagnosis not present

## 2017-08-11 DIAGNOSIS — I69391 Dysphagia following cerebral infarction: Secondary | ICD-10-CM | POA: Diagnosis not present

## 2017-08-11 DIAGNOSIS — N319 Neuromuscular dysfunction of bladder, unspecified: Secondary | ICD-10-CM | POA: Diagnosis not present

## 2017-08-11 DIAGNOSIS — I5032 Chronic diastolic (congestive) heart failure: Secondary | ICD-10-CM | POA: Diagnosis not present

## 2017-08-11 DIAGNOSIS — N184 Chronic kidney disease, stage 4 (severe): Secondary | ICD-10-CM | POA: Diagnosis not present

## 2017-08-11 DIAGNOSIS — M1990 Unspecified osteoarthritis, unspecified site: Secondary | ICD-10-CM | POA: Diagnosis not present

## 2017-08-12 ENCOUNTER — Telehealth: Payer: Self-pay | Admitting: Internal Medicine

## 2017-08-12 MED ORDER — CEPHALEXIN 500 MG PO CAPS: 500.0000 mg | ORAL_CAPSULE | Freq: Two times a day (BID) | ORAL | 0 refills | Status: AC

## 2017-08-12 NOTE — Telephone Encounter (Signed)
   Reason for call:   I received a call from [name]'s daughter who is her caregiver at 5:15 PM indicating patient had thick discharge from foley.   Pertinent Data:   Patient with chronic urinary catheter changed qMonthly; last changed yesterday and was noted to have thick discharge which has continued to today; she has frequent UTIs due to the indwelling catheter and requires courses of antibiotics frequently.   Daughter states that late this afternoon she also had onset of nausea and vomiting (nonbloody, nonbilious) of one episode; she vomited up dose of zofran but has so far kept down a re-dose. Daughter states that she has been constipated and this is likely contributing to her nausea; she gave her a stool softener and while on the phone with me she stated that patient was having a bowel movement.   Daughter denies that patient has had fevers. She does have some abdominal pain which she attributes to constipation.   Assessment / Plan / Recommendations:   Patient with likely recurrent UTI; from chart review, it appears that a conversation was had to try to limit ED visits if possible for these recurrent UTIs and that daughter would call clinic if she were to be symptomatic again, which the daughter confirms.   The thick foley discharge could likely be 2/2 UTI; her last couple of urine cultures have grown multiple organisms with none predominant. Other cultures grew out E coli resistant to ampicillin, ciprofloxacin and bactrim.  N/V might be related to UTI or constipation. It seems that the constipation is now getting controlled and the nausea somewhat improved as she was able to keep down a 2nd dose of zofran so far.   Will send in keflex 500mg  BID x5d to pharmacy  Encouraged to keep hydrated as able; if unable to tolerate PO intake and control vomiting at home, advised to go to the ED.   Will have triage RN call in the AM to check in and assess whether she needs to be seen in  [facility].   As always, pt is advised that if symptoms worsen or new symptoms arise, they should go to an urgent care facility or to to ER for further evaluation.   Alphonzo Grieve, MD   08/12/2017, 5:31 PM

## 2017-08-13 ENCOUNTER — Telehealth: Payer: Self-pay | Admitting: Internal Medicine

## 2017-08-13 NOTE — Telephone Encounter (Signed)
Called and talked to [place] again; after talking with Dr Daryll Drown, informed to try Imodium but only once a day (to prevent constipation) and to call back or take to UC if pt dev fever, lethargy or any change in stools or condition. She voiced understanding.

## 2017-08-13 NOTE — Telephone Encounter (Signed)
Discussed with [place].  Given patients history, would be very cautious with imodium type products.  Would recommend only using up to once per day given her history of constipation.  Discussed red flag symptoms such as fever, increased lethargy etc which should prompt a visit to see [place] or Urgent Care.

## 2017-08-13 NOTE — Telephone Encounter (Signed)
Talked to pt's daughter,Denise who statted pt is no longer having nausea; pt is eating crackers and drinking fluids.She picked up abx last night ;pt has not started taking yet. But stated pt is having diarrhea; "running stools'. Wanted to know if she can take antidiarrheal med. Informed she can buy Imodium over the counter but let me ask The Attending.

## 2017-08-16 ENCOUNTER — Emergency Department (HOSPITAL_COMMUNITY)
Admission: EM | Admit: 2017-08-16 | Discharge: 2017-08-17 | Disposition: A | Discharge status: Home / Self Care | Payer: Medicare Other | Attending: Emergency Medicine | Admitting: Emergency Medicine

## 2017-08-16 DIAGNOSIS — R197 Diarrhea, unspecified: Secondary | ICD-10-CM | POA: Insufficient documentation

## 2017-08-16 DIAGNOSIS — Z794 Long term (current) use of insulin: Secondary | ICD-10-CM | POA: Diagnosis not present

## 2017-08-16 DIAGNOSIS — N184 Chronic kidney disease, stage 4 (severe): Secondary | ICD-10-CM | POA: Insufficient documentation

## 2017-08-16 DIAGNOSIS — E1122 Type 2 diabetes mellitus with diabetic chronic kidney disease: Secondary | ICD-10-CM | POA: Insufficient documentation

## 2017-08-16 DIAGNOSIS — R110 Nausea: Secondary | ICD-10-CM | POA: Diagnosis not present

## 2017-08-16 DIAGNOSIS — R112 Nausea with vomiting, unspecified: Secondary | ICD-10-CM

## 2017-08-16 DIAGNOSIS — R000 Tachycardia, unspecified: Secondary | ICD-10-CM | POA: Diagnosis not present

## 2017-08-16 DIAGNOSIS — I10 Essential (primary) hypertension: Secondary | ICD-10-CM | POA: Diagnosis not present

## 2017-08-16 DIAGNOSIS — I130 Hypertensive heart and chronic kidney disease with heart failure and stage 1 through stage 4 chronic kidney disease, or unspecified chronic kidney disease: Secondary | ICD-10-CM | POA: Insufficient documentation

## 2017-08-16 DIAGNOSIS — Z79899 Other long term (current) drug therapy: Secondary | ICD-10-CM | POA: Diagnosis not present

## 2017-08-16 DIAGNOSIS — R509 Fever, unspecified: Secondary | ICD-10-CM | POA: Diagnosis not present

## 2017-08-16 DIAGNOSIS — I5032 Chronic diastolic (congestive) heart failure: Secondary | ICD-10-CM | POA: Diagnosis not present

## 2017-08-16 DIAGNOSIS — N289 Disorder of kidney and ureter, unspecified: Secondary | ICD-10-CM | POA: Diagnosis not present

## 2017-08-16 DIAGNOSIS — R52 Pain, unspecified: Secondary | ICD-10-CM

## 2017-08-16 DIAGNOSIS — Z87891 Personal history of nicotine dependence: Secondary | ICD-10-CM | POA: Insufficient documentation

## 2017-08-16 DIAGNOSIS — J449 Chronic obstructive pulmonary disease, unspecified: Secondary | ICD-10-CM | POA: Insufficient documentation

## 2017-08-16 DIAGNOSIS — R5383 Other fatigue: Secondary | ICD-10-CM | POA: Diagnosis not present

## 2017-08-16 DIAGNOSIS — R1111 Vomiting without nausea: Secondary | ICD-10-CM | POA: Diagnosis not present

## 2017-08-16 DIAGNOSIS — R0902 Hypoxemia: Secondary | ICD-10-CM | POA: Diagnosis not present

## 2017-08-16 MED ORDER — ONDANSETRON HCL 4 MG/2ML IJ SOLN
4.0000 mg | Freq: Once | INTRAMUSCULAR | Status: AC
  Administered 2017-08-16: 4 mg via INTRAVENOUS
  Filled 2017-08-16: qty 2

## 2017-08-16 MED ORDER — SODIUM CHLORIDE 0.9 % IV BOLUS (SEPSIS)
1000.0000 mL | Freq: Once | INTRAVENOUS | Status: AC
  Administered 2017-08-16: 1000 mL via INTRAVENOUS

## 2017-08-16 NOTE — ED Triage Notes (Signed)
Pt BIB [facility] from home. Pt c/o N/V/D pain all over. CAOx3 is baseline and is currently CAOx2. Pt feels hot to touch. N/V has been the last 2 weeks has seen physician and was prescribed zofran which has worked up until today. Diarrhea started 2 days ago along with AMS. Hx stroke and left side deficit is from previous stroke.

## 2017-08-16 NOTE — ED Provider Notes (Signed)
[facility] DEPT Provider Note   CSN: [id number] Arrival date & time: 08/16/17  2125    History   Chief Complaint Chief Complaint  Patient presents with  . Nausea  . Emesis  . Diarrhea    HPI Michelle Holmes is a 67 y.o. female.  The history is provided by the patient, a relative and a caregiver.  Emesis   This is a new problem. The current episode started more than 2 days ago. The problem has been gradually worsening. The emesis has an appearance of stomach contents (no hematemesis). The maximum temperature recorded prior to her arrival was 100 to 100.9 F. Associated symptoms include diarrhea.  Diarrhea   This is a new problem. The current episode started more than 2 days ago. The problem has been gradually worsening. The stool consistency is described as watery (denies hematochezia). Associated symptoms include vomiting.  pt with h/o CHF, COPD, previous CVA with right sided hemi-paresis presents with vomiting/diarrhea.  Per daughter who is caregiver, pt first had symptoms over a week ago with vomiting/diarrhea There was some improvement and then worsened today No blood in stool/vomit She has had diffuse pain but that is improved She has been on antibiotics recently No change in mental status and no new weakness reported  Past Medical History:  Diagnosis Date  . Anemia   . CHF (congestive heart failure) ([place])   . Chronic back pain   . COPD (chronic obstructive pulmonary disease) ([place])   . Coronary artery disease   . Diabetes mellitus   . DJD (degenerative joint disease)   . Emphysema   . Hypercholesteremia   . Hypertension   . Myocardial infarction ([place])   . Obesity   . Obesity hypoventilation syndrome ([place])   . Renal insufficiency   . Scoliosis   . Sleep apnea   . Stroke ([place])   . TIA (transient ischemic attack)     Patient Active Problem List   Diagnosis Date Noted  . Sprain of right ankle 07/29/2017  . Stroke (cerebrum) ([place])  08/04/2016  . Long-term current use of opiate analgesic 03/05/2016  . Essential hypertension 03/05/2016  . Chronic diastolic (congestive) heart failure ([place]) 01/28/2016  . Protein-calorie malnutrition, severe 01/28/2016  . Healthcare maintenance   . Palliative care by specialist   . Anemia in other chronic diseases classified elsewhere   . Chronic kidney disease (CKD), stage IV (severe) ([place]) 07/06/2015  . Urinary tract infection associated with indwelling urethral catheter ([place]) 07/04/2015  . 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 Thrombocytopenia ([place])   . Dysarthria   . HLD (hyperlipidemia)   . Chronic obstructive pulmonary disease ([place])   . Hemiparesis, aphasia, and dysphagia as late effect of cerebrovascular accident (CVA) ([place])   . Right hemiplegia ([place])   . OSA on CPAP 12/08/2010  . CAD (coronary artery disease) 12/08/2010  . Controlled diabetes mellitus type 2 with complications ([place]) 10/93/2355    Past Surgical History:  Procedure Laterality Date  . bil uterscospy     08/18/16 Dr. Jeffie Pollock  . COLON SURGERY    . CORONARY ANGIOPLASTY WITH STENT PLACEMENT    . CYSTOSCOPY WITH STENT PLACEMENT Bilateral 08/04/2016   Procedure: CYSTOSCOPY WITH STENT PLACEMENT bilateral bilateral retrograde fecal disimpaction;  Surgeon: Ardis Hughs, MD;  Location: [facility];  Service: Urology;  Laterality: Bilateral;  . CYSTOSCOPY/URETEROSCOPY/HOLMIUM LASER/STENT PLACEMENT Bilateral 08/18/2016   Procedure: BILATERAL URETEROSCOPY WITH HOLMIUM LASER AND STENTS;  Surgeon: Irine Seal, MD;  Location: [facility];  Service: Urology;  Laterality: Bilateral;     OB History   None      Home Medications    Prior to Admission medications   Medication Sig Start Date End Date Taking? Authorizing Provider  acetaminophen (TYLENOL) 500 MG tablet Take 500 mg by mouth every 6 (six) hours as needed for moderate pain.   Yes [provider]  amLODipine (NORVASC) 5 MG tablet Take 5 mg by mouth  daily.  05/03/17  Yes [provider]  cephALEXin (KEFLEX) 500 MG capsule Take 1 capsule (500 mg total) by mouth 2 (two) times daily for 10 doses. 08/12/17 08/17/17 Yes Alphonzo Grieve, MD  LANTUS SOLOSTAR 100 UNIT/ML Solostar Pen Inject 10 Units into the skin at bedtime.  12/21/16  Yes [provider]  ondansetron (ZOFRAN) 4 MG tablet Take 1 tablet (4 mg total) by mouth daily as needed for nausea or vomiting. 05/28/17 05/28/18 Yes Lorella Nimrod, MD  Oxycodone HCl 10 MG TABS Take 1 tablet (10 mg total) by mouth 3 (three) times daily. 07/26/17  Yes Lorella Nimrod, MD  Vitamin D, Ergocalciferol, (DRISDOL) 50000 units CAPS capsule Take 1 capsule (50,000 Units total) by mouth every 7 (seven) days. 8 weeks. 03/06/17  Ezekiel Slocumb, MD  Diapers & Supplies MISC Please provide patient with adult diapers and pads 06/03/16   Ophelia Shoulder, MD  erythromycin ophthalmic ointment Place a 1/2 inch ribbon of ointment into the lower eyelid every 6 hours. Patient not taking: Reported on 07/05/2017 01/10/17   Charlesetta Shanks, MD    Family History Family History  Problem Relation Age of Onset  . Diabetes Mother   . Stroke Maternal Aunt     Social History Social History   Tobacco Use  . Smoking status: Former Research scientist (life sciences)  . Smokeless tobacco: Never Used  Substance Use Topics  . Alcohol use: No  . Drug use: No     Allergies   Penicillins   Review of Systems Review of Systems  Constitutional: Positive for fatigue.  Cardiovascular: Negative for chest pain.  Gastrointestinal: Positive for diarrhea and vomiting. Negative for blood in stool.  All other systems reviewed and are negative.    Physical Exam Updated Vital Signs BP 140/83   Pulse 84   Temp 100 F (37.8 C) (Rectal)   Resp 19   SpO2 99%   Physical Exam CONSTITUTIONAL: chronically ill, but no acute distress HEAD: Normocephalic/atraumatic EYES: EOMI/PERRL, no icterus ENMT: Mucous membranes dry NECK: supple no meningeal  signs SPINE/BACK:no cervical spine tenderness CV: S1/S2 noted, no murmurs/rubs/gallops noted LUNGS: Lungs are clear to auscultation bilaterally, no apparent distress ABDOMEN: soft, nontender, no rebound or guarding, bowel sounds noted throughout abdomen, no distention Foley cath in place on arrival NEURO: Pt is awake/alert/appropriate, right sided hemi-paresis with contractures EXTREMITIES: pulses normal/equal, no signs of trauma SKIN: warm, color normal PSYCH: flat affect  ED Treatments / Results  Labs (all labs ordered are listed, but only abnormal results are displayed) Labs Reviewed  CBC WITH DIFFERENTIAL/PLATELET - Abnormal; Notable for the following components:      Result Value   WBC 10.6 (*)    RBC 2.87 (*)    Hemoglobin 10.1 (*)    HCT 29.5 (*)    MCV 102.8 (*)    MCH 35.2 (*)    Neutro Abs 8.2 (*)    All other components within normal limits  COMPREHENSIVE METABOLIC PANEL - Abnormal; Notable for the following components:   Chloride 115 (*)  CO2 21 (*)    Glucose, Bld 128 (*)    BUN 58 (*)    Creatinine, Ser 2.68 (*)    GFR calc non Af Amer 17 (*)    GFR calc Af Amer 20 (*)    All other components within normal limits    EKG None  Radiology Dg Chest 1 View  Result Date: 08/17/2017 CLINICAL DATA:  Fever, abdominal pain, and diarrhea for 1 week. EXAM: CHEST  1 VIEW COMPARISON:  08/04/2016 FINDINGS: Examination is technically limited due to patient positioning. Shallow inspiration. Heart size and pulmonary vascularity are normal for technique. Probable thoracic kyphoscoliosis. Visualized lungs appear clear. No pneumothorax. Degenerative changes in the spine and shoulders. IMPRESSION: No active disease.  Limited examination due to patient positioning. Electronically Signed   By: Lucienne Capers M.D.   On: 08/17/2017 01:59   Dg Abd 1 View  Result Date: 08/17/2017 CLINICAL DATA:  Fever, abdominal pain, and diarrhea for 1 week. EXAM: ABDOMEN - 1 VIEW COMPARISON:  CT  abdomen and pelvis 08/04/2016 FINDINGS: Examination is limited due to patient positioning and overlying wires. Scattered gas-filled small and large bowel loops are present. There is no small or large bowel distention. No radiopaque stones are identified. Vascular calcifications. Surgical clips in the right upper quadrant. Calcified phleboliths in the pelvis. IMPRESSION: Nonobstructive bowel gas pattern.  Technically limited study. Electronically Signed   By: Lucienne Capers M.D.   On: 08/17/2017 01:57    Procedures Procedures    Medications Ordered in ED Medications  sodium chloride 0.9 % bolus 500 mL (500 mLs Intravenous New Bag/Given 08/17/17 0311)  sodium chloride 0.9 % bolus 1,000 mL (0 mLs Intravenous Stopped 08/17/17 0253)  ondansetron (ZOFRAN) injection 4 mg (4 mg Intravenous Given 08/16/17 2348)     Initial Impression / Assessment and Plan / ED Course  I have reviewed the triage vital signs and the nursing notes.  Pertinent labs  results that were available during my care of the patient were reviewed by me and considered in my medical decision making (see chart for details).     12:03 AM Pt with vomiting/diarrhea intermittently for past week It is now worsening per family Pt is awake/alert, no distress, not septic appearing Labs pending 3:37 AM Pt is improved.  She is taking p.o. Fluids. Imaging was performed, no acute findings.  She has mild worsening renal insufficiency, likely due to mild dehydration. I had long discussion with her daughter who cares for her daily.  She reports diarrhea seems to be worsened with fried foods.  Patient enjoys eating [employer] chicken that usually when it gets worse. We were unable to obtain a stool sample here as her diarrhea had improved.  No vomiting.  She is not septic appearing. Daughter feels comfortable taking her home.  She will follow-up with her PCP for further management and if need be stool studies. Final Clinical Impressions(s) / ED  Diagnoses   Final diagnoses:  Nausea vomiting and diarrhea  Renal insufficiency    ED Discharge Orders    None       Ripley Fraise, MD 08/17/17 [phone]

## 2017-08-16 NOTE — ED Notes (Signed)
Bed: ID02 Expected date:  Expected time:  Means of arrival:  Comments: EMS 67 yo female from home N/V "pain all over"-vomiting x 2 weeks-altered mental status

## 2017-08-17 ENCOUNTER — Emergency Department (HOSPITAL_COMMUNITY): Payer: Medicare Other

## 2017-08-17 DIAGNOSIS — R509 Fever, unspecified: Secondary | ICD-10-CM | POA: Diagnosis not present

## 2017-08-17 DIAGNOSIS — Z743 Need for continuous supervision: Secondary | ICD-10-CM | POA: Diagnosis not present

## 2017-08-17 DIAGNOSIS — N319 Neuromuscular dysfunction of bladder, unspecified: Secondary | ICD-10-CM | POA: Diagnosis not present

## 2017-08-17 DIAGNOSIS — I69320 Aphasia following cerebral infarction: Secondary | ICD-10-CM | POA: Diagnosis not present

## 2017-08-17 DIAGNOSIS — Z466 Encounter for fitting and adjustment of urinary device: Secondary | ICD-10-CM | POA: Diagnosis not present

## 2017-08-17 DIAGNOSIS — R197 Diarrhea, unspecified: Secondary | ICD-10-CM | POA: Diagnosis not present

## 2017-08-17 DIAGNOSIS — I130 Hypertensive heart and chronic kidney disease with heart failure and stage 1 through stage 4 chronic kidney disease, or unspecified chronic kidney disease: Secondary | ICD-10-CM | POA: Diagnosis not present

## 2017-08-17 DIAGNOSIS — N184 Chronic kidney disease, stage 4 (severe): Secondary | ICD-10-CM | POA: Diagnosis not present

## 2017-08-17 DIAGNOSIS — R112 Nausea with vomiting, unspecified: Secondary | ICD-10-CM | POA: Diagnosis not present

## 2017-08-17 DIAGNOSIS — I5032 Chronic diastolic (congestive) heart failure: Secondary | ICD-10-CM | POA: Diagnosis not present

## 2017-08-17 DIAGNOSIS — R279 Unspecified lack of coordination: Secondary | ICD-10-CM | POA: Diagnosis not present

## 2017-08-17 DIAGNOSIS — E1122 Type 2 diabetes mellitus with diabetic chronic kidney disease: Secondary | ICD-10-CM | POA: Diagnosis not present

## 2017-08-17 DIAGNOSIS — D631 Anemia in chronic kidney disease: Secondary | ICD-10-CM | POA: Diagnosis not present

## 2017-08-17 DIAGNOSIS — I69391 Dysphagia following cerebral infarction: Secondary | ICD-10-CM | POA: Diagnosis not present

## 2017-08-17 DIAGNOSIS — I2510 Atherosclerotic heart disease of native coronary artery without angina pectoris: Secondary | ICD-10-CM | POA: Diagnosis not present

## 2017-08-17 DIAGNOSIS — M1990 Unspecified osteoarthritis, unspecified site: Secondary | ICD-10-CM | POA: Diagnosis not present

## 2017-08-17 DIAGNOSIS — J439 Emphysema, unspecified: Secondary | ICD-10-CM | POA: Diagnosis not present

## 2017-08-17 DIAGNOSIS — I69351 Hemiplegia and hemiparesis following cerebral infarction affecting right dominant side: Secondary | ICD-10-CM | POA: Diagnosis not present

## 2017-08-17 LAB — COMPREHENSIVE METABOLIC PANEL
ALT: 9 U/L (ref 0–44)
AST: 18 U/L (ref 15–41)
Albumin: 3.9 g/dL (ref 3.5–5.0)
Alkaline Phosphatase: 63 U/L (ref 38–126)
Anion gap: 8 (ref 5–15)
BUN: 58 mg/dL — ABNORMAL HIGH (ref 8–23)
CO2: 21 mmol/L — ABNORMAL LOW (ref 22–32)
Calcium: 9.4 mg/dL (ref 8.9–10.3)
Chloride: 115 mmol/L — ABNORMAL HIGH (ref 98–111)
Creatinine, Ser: 2.68 mg/dL — ABNORMAL HIGH (ref 0.44–1.00)
GFR calc Af Amer: 20 mL/min — ABNORMAL LOW (ref >60)
GFR calc non Af Amer: 17 mL/min — ABNORMAL LOW (ref >60)
Glucose, Bld: 128 mg/dL — ABNORMAL HIGH (ref 70–99)
Potassium: 4.8 mmol/L (ref 3.5–5.1)
Sodium: 144 mmol/L (ref 135–145)
Total Bilirubin: 0.7 mg/dL (ref 0.3–1.2)
Total Protein: 7.2 g/dL (ref 6.5–8.1)

## 2017-08-17 LAB — CBC WITH DIFFERENTIAL/PLATELET
Basophils Absolute: 0 10*3/uL (ref 0.0–0.1)
Basophils Relative: 0 %
Eosinophils Absolute: 0 10*3/uL (ref 0.0–0.7)
Eosinophils Relative: 0 %
HCT: 29.5 % — ABNORMAL LOW (ref 36.0–46.0)
Hemoglobin: 10.1 g/dL — ABNORMAL LOW (ref 12.0–15.0)
Lymphocytes Relative: 16 %
Lymphs Abs: 1.7 10*3/uL (ref 0.7–4.0)
MCH: 35.2 pg — ABNORMAL HIGH (ref 26.0–34.0)
MCHC: 34.2 g/dL (ref 30.0–36.0)
MCV: 102.8 fL — ABNORMAL HIGH (ref 78.0–100.0)
Monocytes Absolute: 0.6 10*3/uL (ref 0.1–1.0)
Monocytes Relative: 6 %
Neutro Abs: 8.2 10*3/uL — ABNORMAL HIGH (ref 1.7–7.7)
Neutrophils Relative %: 78 %
Platelets: 228 10*3/uL (ref 150–400)
RBC: 2.87 MIL/uL — ABNORMAL LOW (ref 3.87–5.11)
RDW: 14.4 % (ref 11.5–15.5)
WBC: 10.6 10*3/uL — ABNORMAL HIGH (ref 4.0–10.5)

## 2017-08-17 MED ORDER — SODIUM CHLORIDE 0.9 % IV BOLUS (SEPSIS)
500.0000 mL | Freq: Once | INTRAVENOUS | Status: AC
  Administered 2017-08-17: 500 mL via INTRAVENOUS

## 2017-08-17 NOTE — ED Notes (Signed)
ED Provider at bedside. 

## 2017-08-21 DIAGNOSIS — J449 Chronic obstructive pulmonary disease, unspecified: Secondary | ICD-10-CM | POA: Diagnosis not present

## 2017-08-31 ENCOUNTER — Telehealth: Payer: Self-pay | Admitting: Internal Medicine

## 2017-08-31 NOTE — Telephone Encounter (Signed)
Michelle Holmes pt daughter [name]) needs the physician to write a letter to state the patient needs a representative for social security disability. She is a POA but they needs a letter from the physician as well. Pls contact# [phone]

## 2017-08-31 NOTE — Telephone Encounter (Signed)
I don't see anything on the patient's problem list that would clearly indicate a need for a representative. Maybe stroke with aphasia? Would be best for Dr. [name] to write this letter when she returns back to the office, which will be next week.

## 2017-09-09 ENCOUNTER — Encounter: Payer: Self-pay | Admitting: Internal Medicine

## 2017-09-09 NOTE — Telephone Encounter (Signed)
Per dr [name], mailed letter and 2 copies to denise Ayre

## 2017-09-15 ENCOUNTER — Telehealth: Payer: Self-pay | Admitting: Internal Medicine

## 2017-09-15 DIAGNOSIS — D631 Anemia in chronic kidney disease: Secondary | ICD-10-CM | POA: Diagnosis not present

## 2017-09-15 DIAGNOSIS — M1990 Unspecified osteoarthritis, unspecified site: Secondary | ICD-10-CM | POA: Diagnosis not present

## 2017-09-15 DIAGNOSIS — N184 Chronic kidney disease, stage 4 (severe): Secondary | ICD-10-CM | POA: Diagnosis not present

## 2017-09-15 DIAGNOSIS — J439 Emphysema, unspecified: Secondary | ICD-10-CM | POA: Diagnosis not present

## 2017-09-15 DIAGNOSIS — S31010D Laceration without foreign body of lower back and pelvis without penetration into retroperitoneum, subsequent encounter: Secondary | ICD-10-CM | POA: Diagnosis not present

## 2017-09-15 DIAGNOSIS — I130 Hypertensive heart and chronic kidney disease with heart failure and stage 1 through stage 4 chronic kidney disease, or unspecified chronic kidney disease: Secondary | ICD-10-CM | POA: Diagnosis not present

## 2017-09-15 DIAGNOSIS — I69351 Hemiplegia and hemiparesis following cerebral infarction affecting right dominant side: Secondary | ICD-10-CM | POA: Diagnosis not present

## 2017-09-15 DIAGNOSIS — N319 Neuromuscular dysfunction of bladder, unspecified: Secondary | ICD-10-CM | POA: Diagnosis not present

## 2017-09-15 DIAGNOSIS — I5032 Chronic diastolic (congestive) heart failure: Secondary | ICD-10-CM | POA: Diagnosis not present

## 2017-09-15 DIAGNOSIS — I2510 Atherosclerotic heart disease of native coronary artery without angina pectoris: Secondary | ICD-10-CM | POA: Diagnosis not present

## 2017-09-15 DIAGNOSIS — E1122 Type 2 diabetes mellitus with diabetic chronic kidney disease: Secondary | ICD-10-CM | POA: Diagnosis not present

## 2017-09-15 DIAGNOSIS — Z466 Encounter for fitting and adjustment of urinary device: Secondary | ICD-10-CM | POA: Diagnosis not present

## 2017-09-15 DIAGNOSIS — I69320 Aphasia following cerebral infarction: Secondary | ICD-10-CM | POA: Diagnosis not present

## 2017-09-15 NOTE — Telephone Encounter (Signed)
[facility] [phone] needs verbal orders, pls call "Urgent"

## 2017-09-15 NOTE — Telephone Encounter (Signed)
Returned call to [place]. No answer. Left message on VM requesting return call. Hubbard Hartshorn, RN, BSN

## 2017-09-16 ENCOUNTER — Telehealth: Payer: Self-pay | Admitting: Internal Medicine

## 2017-09-16 NOTE — Telephone Encounter (Signed)
Michelle Holmes from [place]; needs verbal orders

## 2017-09-16 NOTE — Telephone Encounter (Signed)
Ryan RN, [facility] - stated he changed pt's foley cath yesterday; sediment noted - requesting verbal order for "Monthly cath changes and daughter may flush cath as needed" ; stated they will teach daughter. Told him I will askDr [name] if this is ok and let him know.

## 2017-09-16 NOTE — Telephone Encounter (Signed)
She need her catheter changed every month. They can flush as needed.

## 2017-09-16 NOTE — Telephone Encounter (Signed)
VO given to Ryan at [facility] for monthly cath changes and flush as needed per Dr [name].

## 2017-09-20 NOTE — Telephone Encounter (Signed)
Orders were addressed in separate phone encounter from 09/16/2017. Hubbard Hartshorn, RN, BSN

## 2017-09-21 DIAGNOSIS — J449 Chronic obstructive pulmonary disease, unspecified: Secondary | ICD-10-CM | POA: Diagnosis not present

## 2017-10-22 DIAGNOSIS — J449 Chronic obstructive pulmonary disease, unspecified: Secondary | ICD-10-CM | POA: Diagnosis not present

## 2017-10-25 DIAGNOSIS — I69351 Hemiplegia and hemiparesis following cerebral infarction affecting right dominant side: Secondary | ICD-10-CM | POA: Diagnosis not present

## 2017-10-25 DIAGNOSIS — Z87440 Personal history of urinary (tract) infections: Secondary | ICD-10-CM | POA: Diagnosis not present

## 2017-10-25 DIAGNOSIS — N184 Chronic kidney disease, stage 4 (severe): Secondary | ICD-10-CM | POA: Diagnosis not present

## 2017-10-25 DIAGNOSIS — N319 Neuromuscular dysfunction of bladder, unspecified: Secondary | ICD-10-CM | POA: Diagnosis not present

## 2017-10-25 DIAGNOSIS — Z7901 Long term (current) use of anticoagulants: Secondary | ICD-10-CM | POA: Diagnosis not present

## 2017-10-25 DIAGNOSIS — M1990 Unspecified osteoarthritis, unspecified site: Secondary | ICD-10-CM | POA: Diagnosis not present

## 2017-10-25 DIAGNOSIS — I130 Hypertensive heart and chronic kidney disease with heart failure and stage 1 through stage 4 chronic kidney disease, or unspecified chronic kidney disease: Secondary | ICD-10-CM | POA: Diagnosis not present

## 2017-10-25 DIAGNOSIS — Z794 Long term (current) use of insulin: Secondary | ICD-10-CM | POA: Diagnosis not present

## 2017-10-25 DIAGNOSIS — R1310 Dysphagia, unspecified: Secondary | ICD-10-CM | POA: Diagnosis not present

## 2017-10-25 DIAGNOSIS — I2510 Atherosclerotic heart disease of native coronary artery without angina pectoris: Secondary | ICD-10-CM | POA: Diagnosis not present

## 2017-10-25 DIAGNOSIS — S31010D Laceration without foreign body of lower back and pelvis without penetration into retroperitoneum, subsequent encounter: Secondary | ICD-10-CM | POA: Diagnosis not present

## 2017-10-25 DIAGNOSIS — I69391 Dysphagia following cerebral infarction: Secondary | ICD-10-CM | POA: Diagnosis not present

## 2017-10-25 DIAGNOSIS — Z79891 Long term (current) use of opiate analgesic: Secondary | ICD-10-CM | POA: Diagnosis not present

## 2017-10-25 DIAGNOSIS — I69320 Aphasia following cerebral infarction: Secondary | ICD-10-CM | POA: Diagnosis not present

## 2017-10-25 DIAGNOSIS — D631 Anemia in chronic kidney disease: Secondary | ICD-10-CM | POA: Diagnosis not present

## 2017-10-25 DIAGNOSIS — I5032 Chronic diastolic (congestive) heart failure: Secondary | ICD-10-CM | POA: Diagnosis not present

## 2017-10-25 DIAGNOSIS — Z9181 History of falling: Secondary | ICD-10-CM | POA: Diagnosis not present

## 2017-10-25 DIAGNOSIS — Z466 Encounter for fitting and adjustment of urinary device: Secondary | ICD-10-CM | POA: Diagnosis not present

## 2017-10-25 DIAGNOSIS — E1122 Type 2 diabetes mellitus with diabetic chronic kidney disease: Secondary | ICD-10-CM | POA: Diagnosis not present

## 2017-10-25 DIAGNOSIS — J439 Emphysema, unspecified: Secondary | ICD-10-CM | POA: Diagnosis not present

## 2017-10-25 DIAGNOSIS — G4733 Obstructive sleep apnea (adult) (pediatric): Secondary | ICD-10-CM | POA: Diagnosis not present

## 2017-10-26 ENCOUNTER — Telehealth: Payer: Self-pay

## 2017-10-26 NOTE — Telephone Encounter (Signed)
Michelle Holmes with [facility] want to reported patient had a strong odor and cloudy urine at yesterday visit. Please call back.

## 2017-10-26 NOTE — Telephone Encounter (Signed)
Called Ryan with no response. Called the patient with no response. She can be seen in [facility] if needed.

## 2017-10-27 ENCOUNTER — Telehealth: Payer: Self-pay | Admitting: Internal Medicine

## 2017-10-27 NOTE — Telephone Encounter (Signed)
Pt is returning a call from Thursday, pls call back [phone]

## 2017-10-27 NOTE — Telephone Encounter (Signed)
Thanks

## 2017-10-27 NOTE — Telephone Encounter (Signed)
Placed call to patient. Spoke with daughter who states, "I speak for her." Offered appt today in [facility]. States she cannot transport patient in her car 2/2 w/c and patient's pain level. Relies on medicaid transpo. Earliest she can bring patient is 10/29/2017. Given appt in [facility] 10/29/2017 at 0915. Daughter made aware not to wait till 10/4 if patient develops fever or significant pain with urination. Recommended to call 911 to transport to ED. Daughter agrees. Hubbard Hartshorn, RN, BSN

## 2017-10-27 NOTE — Telephone Encounter (Signed)
This has been addressed in original phone encounter from 10/26/2017. Hubbard Hartshorn, RN, BSN

## 2017-10-29 ENCOUNTER — Other Ambulatory Visit: Payer: Self-pay

## 2017-10-29 ENCOUNTER — Ambulatory Visit (INDEPENDENT_AMBULATORY_CARE_PROVIDER_SITE_OTHER): Payer: Medicare Other | Admitting: Internal Medicine

## 2017-10-29 ENCOUNTER — Telehealth: Payer: Self-pay | Admitting: Internal Medicine

## 2017-10-29 ENCOUNTER — Encounter: Payer: Self-pay | Admitting: Internal Medicine

## 2017-10-29 VITALS — BP 159/77 | HR 71 | Temp 98.9°F

## 2017-10-29 DIAGNOSIS — R1111 Vomiting without nausea: Secondary | ICD-10-CM

## 2017-10-29 DIAGNOSIS — R8290 Unspecified abnormal findings in urine: Secondary | ICD-10-CM | POA: Diagnosis not present

## 2017-10-29 DIAGNOSIS — Z872 Personal history of diseases of the skin and subcutaneous tissue: Secondary | ICD-10-CM

## 2017-10-29 DIAGNOSIS — Z993 Dependence on wheelchair: Secondary | ICD-10-CM

## 2017-10-29 DIAGNOSIS — I69351 Hemiplegia and hemiparesis following cerebral infarction affecting right dominant side: Secondary | ICD-10-CM | POA: Diagnosis not present

## 2017-10-29 DIAGNOSIS — N319 Neuromuscular dysfunction of bladder, unspecified: Secondary | ICD-10-CM | POA: Diagnosis not present

## 2017-10-29 DIAGNOSIS — Z466 Encounter for fitting and adjustment of urinary device: Secondary | ICD-10-CM | POA: Diagnosis not present

## 2017-10-29 DIAGNOSIS — N184 Chronic kidney disease, stage 4 (severe): Secondary | ICD-10-CM | POA: Diagnosis not present

## 2017-10-29 DIAGNOSIS — T83021A Displacement of indwelling urethral catheter, initial encounter: Secondary | ICD-10-CM | POA: Diagnosis not present

## 2017-10-29 DIAGNOSIS — I10 Essential (primary) hypertension: Secondary | ICD-10-CM

## 2017-10-29 DIAGNOSIS — Z87440 Personal history of urinary (tract) infections: Secondary | ICD-10-CM | POA: Diagnosis not present

## 2017-10-29 DIAGNOSIS — J439 Emphysema, unspecified: Secondary | ICD-10-CM | POA: Diagnosis not present

## 2017-10-29 DIAGNOSIS — G4733 Obstructive sleep apnea (adult) (pediatric): Secondary | ICD-10-CM | POA: Diagnosis not present

## 2017-10-29 DIAGNOSIS — I69391 Dysphagia following cerebral infarction: Secondary | ICD-10-CM | POA: Diagnosis not present

## 2017-10-29 DIAGNOSIS — E1122 Type 2 diabetes mellitus with diabetic chronic kidney disease: Secondary | ICD-10-CM | POA: Diagnosis not present

## 2017-10-29 DIAGNOSIS — R339 Retention of urine, unspecified: Secondary | ICD-10-CM

## 2017-10-29 DIAGNOSIS — N390 Urinary tract infection, site not specified: Secondary | ICD-10-CM

## 2017-10-29 DIAGNOSIS — T83511D Infection and inflammatory reaction due to indwelling urethral catheter, subsequent encounter: Principal | ICD-10-CM

## 2017-10-29 DIAGNOSIS — R1310 Dysphagia, unspecified: Secondary | ICD-10-CM | POA: Diagnosis not present

## 2017-10-29 DIAGNOSIS — S31010D Laceration without foreign body of lower back and pelvis without penetration into retroperitoneum, subsequent encounter: Secondary | ICD-10-CM | POA: Diagnosis not present

## 2017-10-29 DIAGNOSIS — Z79891 Long term (current) use of opiate analgesic: Secondary | ICD-10-CM | POA: Diagnosis not present

## 2017-10-29 DIAGNOSIS — G8191 Hemiplegia, unspecified affecting right dominant side: Secondary | ICD-10-CM

## 2017-10-29 DIAGNOSIS — I130 Hypertensive heart and chronic kidney disease with heart failure and stage 1 through stage 4 chronic kidney disease, or unspecified chronic kidney disease: Secondary | ICD-10-CM | POA: Diagnosis not present

## 2017-10-29 DIAGNOSIS — D631 Anemia in chronic kidney disease: Secondary | ICD-10-CM | POA: Diagnosis not present

## 2017-10-29 DIAGNOSIS — I5032 Chronic diastolic (congestive) heart failure: Secondary | ICD-10-CM | POA: Diagnosis not present

## 2017-10-29 DIAGNOSIS — Z7901 Long term (current) use of anticoagulants: Secondary | ICD-10-CM | POA: Diagnosis not present

## 2017-10-29 DIAGNOSIS — R1110 Vomiting, unspecified: Secondary | ICD-10-CM

## 2017-10-29 DIAGNOSIS — I2510 Atherosclerotic heart disease of native coronary artery without angina pectoris: Secondary | ICD-10-CM | POA: Diagnosis not present

## 2017-10-29 DIAGNOSIS — Z79899 Other long term (current) drug therapy: Secondary | ICD-10-CM

## 2017-10-29 DIAGNOSIS — Z9181 History of falling: Secondary | ICD-10-CM | POA: Diagnosis not present

## 2017-10-29 DIAGNOSIS — M1990 Unspecified osteoarthritis, unspecified site: Secondary | ICD-10-CM | POA: Diagnosis not present

## 2017-10-29 DIAGNOSIS — Z794 Long term (current) use of insulin: Secondary | ICD-10-CM | POA: Diagnosis not present

## 2017-10-29 DIAGNOSIS — I69320 Aphasia following cerebral infarction: Secondary | ICD-10-CM | POA: Diagnosis not present

## 2017-10-29 MED ORDER — OXYCODONE HCL 10 MG PO TABS: 10.0000 mg | ORAL_TABLET | Freq: Three times a day (TID) | ORAL | 0 refills | Status: DC

## 2017-10-29 MED ORDER — AMLODIPINE BESYLATE 5 MG PO TABS: 5.0000 mg | ORAL_TABLET | Freq: Every day | ORAL | 11 refills | Status: DC

## 2017-10-29 NOTE — Progress Notes (Deleted)
   CC: ***  HPI:Ms.STORMI VANDEVELDE is a 67 y.o. *** who presents for evaluation of ***. Please see individual problem based A/P for details.  ***:  ***:  PHQ-9: Based on the patients  score we have ***.  Past Medical History:  Diagnosis Date  . Anemia   . CHF (congestive heart failure) ([place])   . Chronic back pain   . COPD (chronic obstructive pulmonary disease) ([place])   . Coronary artery disease   . Diabetes mellitus   . DJD (degenerative joint disease)   . Emphysema   . Hypercholesteremia   . Hypertension   . Myocardial infarction ([place])   . Obesity   . Obesity hypoventilation syndrome ([place])   . Renal insufficiency   . Scoliosis   . Sleep apnea   . Stroke ([place])   . TIA (transient ischemic attack)    Review of Systems:  ***  Physical Exam: There were no vitals filed for this visit. General: *** HEENT: *** Cardio: *** Pulmonary: *** MSK: ***  Assessment & Plan:   See Encounters Tab for problem based charting.  Patient {GC/GE:3044014::"discussed with","seen with"} Dr. {NAMES:[id number]::"Butcher","Granfortuna","E. Hoffman","Klima","Mullen","Narendra","Raines","Vincent"}

## 2017-10-29 NOTE — Telephone Encounter (Signed)
Done. Let me know if done correctly.

## 2017-10-29 NOTE — Telephone Encounter (Signed)
Per your request called [facility] Contact  Glyn Ade, RN).  She will go ahead and start the (Replace Foley VO)  Per Dorian Pod, please place a [place] order  as well to be faxed into [facility].

## 2017-10-29 NOTE — Assessment & Plan Note (Signed)
Uncontrolled today, 159/77.  Unfortunately we did not have time to address her hypertension today as patient had to leave the office when her transportation arrived.  --Refilled amlodipine 5 mg daily --Follow-up PCP

## 2017-10-29 NOTE — Assessment & Plan Note (Signed)
Patient had an episode of emesis this morning after taking her oxycodone and amlodipine on an empty stomach.  She denies abdominal pain and nausea.  Able to tolerate crackers in clinic.  Advised patient to eat prior to taking her medications.

## 2017-10-29 NOTE — Assessment & Plan Note (Signed)
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  She is prescribed oxycodone 10 mg TID.  Per daughter, she takes the pain medication prior to working with physical therapy and prior to dressing changes for her sacral wounds.  She has her prescription bottle with her today, last filled in July with 5 tablets left.  --Refilled oxycodone 10 mg TID (#90) --Follow up PCP

## 2017-10-29 NOTE — Progress Notes (Signed)
   CC: Cloudy Urine  HPI:  [name] is a 67 y.o. female with past medical history outlined below here for cloudy urine. For the details of today's visit, please refer to the assessment and plan.  [facility]   Past Medical History:  Diagnosis Date  . Anemia   . CHF (congestive heart failure) ([place])   . Chronic back pain   . COPD (chronic obstructive pulmonary disease) ([place])   . Coronary artery disease   . Diabetes mellitus   . DJD (degenerative joint disease)   . Emphysema   . Hypercholesteremia   . Hypertension   . Myocardial infarction ([place])   . Obesity   . Obesity hypoventilation syndrome ([place])   . Renal insufficiency   . Scoliosis   . Sleep apnea   . Stroke ([place])   . TIA (transient ischemic attack)     Review of Systems  Constitutional: Negative for chills and fever.  Gastrointestinal: Positive for vomiting. Negative for abdominal pain.  Genitourinary: Negative for dysuria.    Physical Exam:  Vitals:   10/29/17 0936  BP: (!) 159/77  Pulse: 71  Temp: 98.9 F (37.2 C)  TempSrc: Oral  SpO2: 100%    Constitutional: NAD, appears comfortable Cardiovascular: RRR, no murmurs, rubs, or gallops.  Pulmonary/Chest: CTAB, no wheezes, rales, or rhonchi.  Abdominal: Soft, non tender, non distended. +BS.  Skin: No rashes or erythema  Psychiatric: Normal mood and affect  Assessment & Plan:   See Encounters Tab for problem based charting.  Patient discussed with Dr. Dareen Piano

## 2017-10-29 NOTE — Addendum Note (Signed)
Addended by: Jodean Lima on: 10/29/2017 11:06 AM   Modules accepted: Orders

## 2017-10-29 NOTE — Assessment & Plan Note (Signed)
Patient has an indwelling Foley catheter due to chronic urinary retention.  She was sent to the office today by home health care who was concern for UTI due to her urine being cloudy.  Patient denies dysuria, abdominal pain, and fevers.  She is alert and oriented, daughter denies any signs of delirium.  Explained to daughter and patient that due to her chronic indwelling Foley catheter, her urine may chronically be colonized with bacteria.  It is okay for her urine to be cloudy.  Because she has no symptoms of UTI, I did not check a UA.  Instructed daughter and patient to call if she develops any symptoms of UTI.  Unfortunately, this morning while getting ready to leave for this appointment, her indwelling Foley catheter fell out.  Patient prefers to have this replaced by home health care, where she can lie comfortably in her bed while it is being replaced.  She is wheelchair-bound.  Verbal order has been placed for home health replacement of Foley catheter.  -- Home health to replace foley catheter

## 2017-10-29 NOTE — Telephone Encounter (Signed)
Thank you. Will forward to Dr. Philipp Ovens

## 2017-11-01 NOTE — Progress Notes (Signed)
Internal Medicine Clinic Attending  Case discussed with Dr. Guilloud at the time of the visit.  We reviewed the resident's history and exam and pertinent patient test results.  I agree with the assessment, diagnosis, and plan of care documented in the resident's note.  

## 2017-11-04 DIAGNOSIS — N184 Chronic kidney disease, stage 4 (severe): Secondary | ICD-10-CM | POA: Diagnosis not present

## 2017-11-04 DIAGNOSIS — Z79891 Long term (current) use of opiate analgesic: Secondary | ICD-10-CM | POA: Diagnosis not present

## 2017-11-04 DIAGNOSIS — J439 Emphysema, unspecified: Secondary | ICD-10-CM | POA: Diagnosis not present

## 2017-11-04 DIAGNOSIS — R1310 Dysphagia, unspecified: Secondary | ICD-10-CM | POA: Diagnosis not present

## 2017-11-04 DIAGNOSIS — I69320 Aphasia following cerebral infarction: Secondary | ICD-10-CM | POA: Diagnosis not present

## 2017-11-04 DIAGNOSIS — I2510 Atherosclerotic heart disease of native coronary artery without angina pectoris: Secondary | ICD-10-CM | POA: Diagnosis not present

## 2017-11-04 DIAGNOSIS — I69351 Hemiplegia and hemiparesis following cerebral infarction affecting right dominant side: Secondary | ICD-10-CM | POA: Diagnosis not present

## 2017-11-04 DIAGNOSIS — D631 Anemia in chronic kidney disease: Secondary | ICD-10-CM | POA: Diagnosis not present

## 2017-11-04 DIAGNOSIS — E1122 Type 2 diabetes mellitus with diabetic chronic kidney disease: Secondary | ICD-10-CM | POA: Diagnosis not present

## 2017-11-04 DIAGNOSIS — I5032 Chronic diastolic (congestive) heart failure: Secondary | ICD-10-CM | POA: Diagnosis not present

## 2017-11-04 DIAGNOSIS — N319 Neuromuscular dysfunction of bladder, unspecified: Secondary | ICD-10-CM | POA: Diagnosis not present

## 2017-11-04 DIAGNOSIS — I130 Hypertensive heart and chronic kidney disease with heart failure and stage 1 through stage 4 chronic kidney disease, or unspecified chronic kidney disease: Secondary | ICD-10-CM | POA: Diagnosis not present

## 2017-11-04 DIAGNOSIS — S31010D Laceration without foreign body of lower back and pelvis without penetration into retroperitoneum, subsequent encounter: Secondary | ICD-10-CM | POA: Diagnosis not present

## 2017-11-04 DIAGNOSIS — G4733 Obstructive sleep apnea (adult) (pediatric): Secondary | ICD-10-CM | POA: Diagnosis not present

## 2017-11-04 DIAGNOSIS — I69391 Dysphagia following cerebral infarction: Secondary | ICD-10-CM | POA: Diagnosis not present

## 2017-11-04 DIAGNOSIS — M1990 Unspecified osteoarthritis, unspecified site: Secondary | ICD-10-CM | POA: Diagnosis not present

## 2017-11-04 DIAGNOSIS — Z87440 Personal history of urinary (tract) infections: Secondary | ICD-10-CM | POA: Diagnosis not present

## 2017-11-04 DIAGNOSIS — Z466 Encounter for fitting and adjustment of urinary device: Secondary | ICD-10-CM | POA: Diagnosis not present

## 2017-11-04 DIAGNOSIS — Z9181 History of falling: Secondary | ICD-10-CM | POA: Diagnosis not present

## 2017-11-04 DIAGNOSIS — Z7901 Long term (current) use of anticoagulants: Secondary | ICD-10-CM | POA: Diagnosis not present

## 2017-11-04 DIAGNOSIS — Z794 Long term (current) use of insulin: Secondary | ICD-10-CM | POA: Diagnosis not present

## 2017-11-21 DIAGNOSIS — J449 Chronic obstructive pulmonary disease, unspecified: Secondary | ICD-10-CM | POA: Diagnosis not present

## 2017-11-25 DIAGNOSIS — I69351 Hemiplegia and hemiparesis following cerebral infarction affecting right dominant side: Secondary | ICD-10-CM | POA: Diagnosis not present

## 2017-11-25 DIAGNOSIS — Z466 Encounter for fitting and adjustment of urinary device: Secondary | ICD-10-CM | POA: Diagnosis not present

## 2017-11-25 DIAGNOSIS — M1990 Unspecified osteoarthritis, unspecified site: Secondary | ICD-10-CM | POA: Diagnosis not present

## 2017-11-25 DIAGNOSIS — R1310 Dysphagia, unspecified: Secondary | ICD-10-CM | POA: Diagnosis not present

## 2017-11-25 DIAGNOSIS — J439 Emphysema, unspecified: Secondary | ICD-10-CM | POA: Diagnosis not present

## 2017-11-25 DIAGNOSIS — I5032 Chronic diastolic (congestive) heart failure: Secondary | ICD-10-CM | POA: Diagnosis not present

## 2017-11-25 DIAGNOSIS — Z79891 Long term (current) use of opiate analgesic: Secondary | ICD-10-CM | POA: Diagnosis not present

## 2017-11-25 DIAGNOSIS — I2510 Atherosclerotic heart disease of native coronary artery without angina pectoris: Secondary | ICD-10-CM | POA: Diagnosis not present

## 2017-11-25 DIAGNOSIS — I69391 Dysphagia following cerebral infarction: Secondary | ICD-10-CM | POA: Diagnosis not present

## 2017-11-25 DIAGNOSIS — D631 Anemia in chronic kidney disease: Secondary | ICD-10-CM | POA: Diagnosis not present

## 2017-11-25 DIAGNOSIS — N184 Chronic kidney disease, stage 4 (severe): Secondary | ICD-10-CM | POA: Diagnosis not present

## 2017-11-25 DIAGNOSIS — Z9181 History of falling: Secondary | ICD-10-CM | POA: Diagnosis not present

## 2017-11-25 DIAGNOSIS — E1122 Type 2 diabetes mellitus with diabetic chronic kidney disease: Secondary | ICD-10-CM | POA: Diagnosis not present

## 2017-11-25 DIAGNOSIS — Z7901 Long term (current) use of anticoagulants: Secondary | ICD-10-CM | POA: Diagnosis not present

## 2017-11-25 DIAGNOSIS — Z794 Long term (current) use of insulin: Secondary | ICD-10-CM | POA: Diagnosis not present

## 2017-11-25 DIAGNOSIS — S31010D Laceration without foreign body of lower back and pelvis without penetration into retroperitoneum, subsequent encounter: Secondary | ICD-10-CM | POA: Diagnosis not present

## 2017-11-25 DIAGNOSIS — N319 Neuromuscular dysfunction of bladder, unspecified: Secondary | ICD-10-CM | POA: Diagnosis not present

## 2017-11-25 DIAGNOSIS — Z87440 Personal history of urinary (tract) infections: Secondary | ICD-10-CM | POA: Diagnosis not present

## 2017-11-25 DIAGNOSIS — I69320 Aphasia following cerebral infarction: Secondary | ICD-10-CM | POA: Diagnosis not present

## 2017-11-25 DIAGNOSIS — G4733 Obstructive sleep apnea (adult) (pediatric): Secondary | ICD-10-CM | POA: Diagnosis not present

## 2017-11-25 DIAGNOSIS — I130 Hypertensive heart and chronic kidney disease with heart failure and stage 1 through stage 4 chronic kidney disease, or unspecified chronic kidney disease: Secondary | ICD-10-CM | POA: Diagnosis not present

## 2017-12-01 ENCOUNTER — Other Ambulatory Visit: Payer: Self-pay | Admitting: Internal Medicine

## 2017-12-07 DIAGNOSIS — Z7901 Long term (current) use of anticoagulants: Secondary | ICD-10-CM | POA: Diagnosis not present

## 2017-12-07 DIAGNOSIS — I69391 Dysphagia following cerebral infarction: Secondary | ICD-10-CM | POA: Diagnosis not present

## 2017-12-07 DIAGNOSIS — I130 Hypertensive heart and chronic kidney disease with heart failure and stage 1 through stage 4 chronic kidney disease, or unspecified chronic kidney disease: Secondary | ICD-10-CM | POA: Diagnosis not present

## 2017-12-07 DIAGNOSIS — N184 Chronic kidney disease, stage 4 (severe): Secondary | ICD-10-CM | POA: Diagnosis not present

## 2017-12-07 DIAGNOSIS — Z794 Long term (current) use of insulin: Secondary | ICD-10-CM | POA: Diagnosis not present

## 2017-12-07 DIAGNOSIS — Z9181 History of falling: Secondary | ICD-10-CM | POA: Diagnosis not present

## 2017-12-07 DIAGNOSIS — Z79891 Long term (current) use of opiate analgesic: Secondary | ICD-10-CM | POA: Diagnosis not present

## 2017-12-07 DIAGNOSIS — S31010D Laceration without foreign body of lower back and pelvis without penetration into retroperitoneum, subsequent encounter: Secondary | ICD-10-CM | POA: Diagnosis not present

## 2017-12-07 DIAGNOSIS — N319 Neuromuscular dysfunction of bladder, unspecified: Secondary | ICD-10-CM | POA: Diagnosis not present

## 2017-12-07 DIAGNOSIS — I2510 Atherosclerotic heart disease of native coronary artery without angina pectoris: Secondary | ICD-10-CM | POA: Diagnosis not present

## 2017-12-07 DIAGNOSIS — R1310 Dysphagia, unspecified: Secondary | ICD-10-CM | POA: Diagnosis not present

## 2017-12-07 DIAGNOSIS — E1122 Type 2 diabetes mellitus with diabetic chronic kidney disease: Secondary | ICD-10-CM | POA: Diagnosis not present

## 2017-12-07 DIAGNOSIS — Z87440 Personal history of urinary (tract) infections: Secondary | ICD-10-CM | POA: Diagnosis not present

## 2017-12-07 DIAGNOSIS — D631 Anemia in chronic kidney disease: Secondary | ICD-10-CM | POA: Diagnosis not present

## 2017-12-07 DIAGNOSIS — M1990 Unspecified osteoarthritis, unspecified site: Secondary | ICD-10-CM | POA: Diagnosis not present

## 2017-12-07 DIAGNOSIS — I69351 Hemiplegia and hemiparesis following cerebral infarction affecting right dominant side: Secondary | ICD-10-CM | POA: Diagnosis not present

## 2017-12-07 DIAGNOSIS — J439 Emphysema, unspecified: Secondary | ICD-10-CM | POA: Diagnosis not present

## 2017-12-07 DIAGNOSIS — I5032 Chronic diastolic (congestive) heart failure: Secondary | ICD-10-CM | POA: Diagnosis not present

## 2017-12-07 DIAGNOSIS — I69320 Aphasia following cerebral infarction: Secondary | ICD-10-CM | POA: Diagnosis not present

## 2017-12-07 DIAGNOSIS — Z466 Encounter for fitting and adjustment of urinary device: Secondary | ICD-10-CM | POA: Diagnosis not present

## 2017-12-07 DIAGNOSIS — G4733 Obstructive sleep apnea (adult) (pediatric): Secondary | ICD-10-CM | POA: Diagnosis not present

## 2017-12-22 DIAGNOSIS — Z466 Encounter for fitting and adjustment of urinary device: Secondary | ICD-10-CM | POA: Diagnosis not present

## 2017-12-22 DIAGNOSIS — Z7901 Long term (current) use of anticoagulants: Secondary | ICD-10-CM | POA: Diagnosis not present

## 2017-12-22 DIAGNOSIS — S31010D Laceration without foreign body of lower back and pelvis without penetration into retroperitoneum, subsequent encounter: Secondary | ICD-10-CM | POA: Diagnosis not present

## 2017-12-22 DIAGNOSIS — I2510 Atherosclerotic heart disease of native coronary artery without angina pectoris: Secondary | ICD-10-CM | POA: Diagnosis not present

## 2017-12-22 DIAGNOSIS — D631 Anemia in chronic kidney disease: Secondary | ICD-10-CM | POA: Diagnosis not present

## 2017-12-22 DIAGNOSIS — N184 Chronic kidney disease, stage 4 (severe): Secondary | ICD-10-CM | POA: Diagnosis not present

## 2017-12-22 DIAGNOSIS — N319 Neuromuscular dysfunction of bladder, unspecified: Secondary | ICD-10-CM | POA: Diagnosis not present

## 2017-12-22 DIAGNOSIS — I130 Hypertensive heart and chronic kidney disease with heart failure and stage 1 through stage 4 chronic kidney disease, or unspecified chronic kidney disease: Secondary | ICD-10-CM | POA: Diagnosis not present

## 2017-12-22 DIAGNOSIS — J439 Emphysema, unspecified: Secondary | ICD-10-CM | POA: Diagnosis not present

## 2017-12-22 DIAGNOSIS — Z87440 Personal history of urinary (tract) infections: Secondary | ICD-10-CM | POA: Diagnosis not present

## 2017-12-22 DIAGNOSIS — Z9181 History of falling: Secondary | ICD-10-CM | POA: Diagnosis not present

## 2017-12-22 DIAGNOSIS — G4733 Obstructive sleep apnea (adult) (pediatric): Secondary | ICD-10-CM | POA: Diagnosis not present

## 2017-12-22 DIAGNOSIS — R1310 Dysphagia, unspecified: Secondary | ICD-10-CM | POA: Diagnosis not present

## 2017-12-22 DIAGNOSIS — Z79891 Long term (current) use of opiate analgesic: Secondary | ICD-10-CM | POA: Diagnosis not present

## 2017-12-22 DIAGNOSIS — I69391 Dysphagia following cerebral infarction: Secondary | ICD-10-CM | POA: Diagnosis not present

## 2017-12-22 DIAGNOSIS — M1990 Unspecified osteoarthritis, unspecified site: Secondary | ICD-10-CM | POA: Diagnosis not present

## 2017-12-22 DIAGNOSIS — Z794 Long term (current) use of insulin: Secondary | ICD-10-CM | POA: Diagnosis not present

## 2017-12-22 DIAGNOSIS — I5032 Chronic diastolic (congestive) heart failure: Secondary | ICD-10-CM | POA: Diagnosis not present

## 2017-12-22 DIAGNOSIS — I69351 Hemiplegia and hemiparesis following cerebral infarction affecting right dominant side: Secondary | ICD-10-CM | POA: Diagnosis not present

## 2017-12-22 DIAGNOSIS — I69320 Aphasia following cerebral infarction: Secondary | ICD-10-CM | POA: Diagnosis not present

## 2017-12-22 DIAGNOSIS — J449 Chronic obstructive pulmonary disease, unspecified: Secondary | ICD-10-CM | POA: Diagnosis not present

## 2017-12-22 DIAGNOSIS — E1122 Type 2 diabetes mellitus with diabetic chronic kidney disease: Secondary | ICD-10-CM | POA: Diagnosis not present

## 2017-12-26 DIAGNOSIS — E1122 Type 2 diabetes mellitus with diabetic chronic kidney disease: Secondary | ICD-10-CM | POA: Diagnosis not present

## 2017-12-26 DIAGNOSIS — Z9181 History of falling: Secondary | ICD-10-CM | POA: Diagnosis not present

## 2017-12-26 DIAGNOSIS — D631 Anemia in chronic kidney disease: Secondary | ICD-10-CM | POA: Diagnosis not present

## 2017-12-26 DIAGNOSIS — I130 Hypertensive heart and chronic kidney disease with heart failure and stage 1 through stage 4 chronic kidney disease, or unspecified chronic kidney disease: Secondary | ICD-10-CM | POA: Diagnosis not present

## 2017-12-26 DIAGNOSIS — Z79891 Long term (current) use of opiate analgesic: Secondary | ICD-10-CM | POA: Diagnosis not present

## 2017-12-26 DIAGNOSIS — I2510 Atherosclerotic heart disease of native coronary artery without angina pectoris: Secondary | ICD-10-CM | POA: Diagnosis not present

## 2017-12-26 DIAGNOSIS — R1310 Dysphagia, unspecified: Secondary | ICD-10-CM | POA: Diagnosis not present

## 2017-12-26 DIAGNOSIS — J439 Emphysema, unspecified: Secondary | ICD-10-CM | POA: Diagnosis not present

## 2017-12-26 DIAGNOSIS — Z7901 Long term (current) use of anticoagulants: Secondary | ICD-10-CM | POA: Diagnosis not present

## 2017-12-26 DIAGNOSIS — Z87440 Personal history of urinary (tract) infections: Secondary | ICD-10-CM | POA: Diagnosis not present

## 2017-12-26 DIAGNOSIS — Z466 Encounter for fitting and adjustment of urinary device: Secondary | ICD-10-CM | POA: Diagnosis not present

## 2017-12-26 DIAGNOSIS — I69351 Hemiplegia and hemiparesis following cerebral infarction affecting right dominant side: Secondary | ICD-10-CM | POA: Diagnosis not present

## 2017-12-26 DIAGNOSIS — M1990 Unspecified osteoarthritis, unspecified site: Secondary | ICD-10-CM | POA: Diagnosis not present

## 2017-12-26 DIAGNOSIS — I69320 Aphasia following cerebral infarction: Secondary | ICD-10-CM | POA: Diagnosis not present

## 2017-12-26 DIAGNOSIS — Z794 Long term (current) use of insulin: Secondary | ICD-10-CM | POA: Diagnosis not present

## 2017-12-26 DIAGNOSIS — G4733 Obstructive sleep apnea (adult) (pediatric): Secondary | ICD-10-CM | POA: Diagnosis not present

## 2017-12-26 DIAGNOSIS — N319 Neuromuscular dysfunction of bladder, unspecified: Secondary | ICD-10-CM | POA: Diagnosis not present

## 2017-12-26 DIAGNOSIS — I5032 Chronic diastolic (congestive) heart failure: Secondary | ICD-10-CM | POA: Diagnosis not present

## 2017-12-26 DIAGNOSIS — N184 Chronic kidney disease, stage 4 (severe): Secondary | ICD-10-CM | POA: Diagnosis not present

## 2017-12-26 DIAGNOSIS — I69391 Dysphagia following cerebral infarction: Secondary | ICD-10-CM | POA: Diagnosis not present

## 2017-12-27 DIAGNOSIS — I130 Hypertensive heart and chronic kidney disease with heart failure and stage 1 through stage 4 chronic kidney disease, or unspecified chronic kidney disease: Secondary | ICD-10-CM | POA: Diagnosis not present

## 2017-12-27 DIAGNOSIS — E1122 Type 2 diabetes mellitus with diabetic chronic kidney disease: Secondary | ICD-10-CM | POA: Diagnosis not present

## 2017-12-27 DIAGNOSIS — I69351 Hemiplegia and hemiparesis following cerebral infarction affecting right dominant side: Secondary | ICD-10-CM | POA: Diagnosis not present

## 2017-12-27 DIAGNOSIS — I5032 Chronic diastolic (congestive) heart failure: Secondary | ICD-10-CM | POA: Diagnosis not present

## 2017-12-27 DIAGNOSIS — I2510 Atherosclerotic heart disease of native coronary artery without angina pectoris: Secondary | ICD-10-CM | POA: Diagnosis not present

## 2017-12-27 DIAGNOSIS — Z7901 Long term (current) use of anticoagulants: Secondary | ICD-10-CM | POA: Diagnosis not present

## 2017-12-27 DIAGNOSIS — Z87440 Personal history of urinary (tract) infections: Secondary | ICD-10-CM | POA: Diagnosis not present

## 2017-12-27 DIAGNOSIS — N184 Chronic kidney disease, stage 4 (severe): Secondary | ICD-10-CM | POA: Diagnosis not present

## 2017-12-27 DIAGNOSIS — D631 Anemia in chronic kidney disease: Secondary | ICD-10-CM | POA: Diagnosis not present

## 2017-12-27 DIAGNOSIS — G4733 Obstructive sleep apnea (adult) (pediatric): Secondary | ICD-10-CM | POA: Diagnosis not present

## 2017-12-27 DIAGNOSIS — Z466 Encounter for fitting and adjustment of urinary device: Secondary | ICD-10-CM | POA: Diagnosis not present

## 2017-12-27 DIAGNOSIS — I69320 Aphasia following cerebral infarction: Secondary | ICD-10-CM | POA: Diagnosis not present

## 2017-12-27 DIAGNOSIS — I69391 Dysphagia following cerebral infarction: Secondary | ICD-10-CM | POA: Diagnosis not present

## 2017-12-27 DIAGNOSIS — Z9181 History of falling: Secondary | ICD-10-CM | POA: Diagnosis not present

## 2017-12-27 DIAGNOSIS — J439 Emphysema, unspecified: Secondary | ICD-10-CM | POA: Diagnosis not present

## 2017-12-27 DIAGNOSIS — Z79891 Long term (current) use of opiate analgesic: Secondary | ICD-10-CM | POA: Diagnosis not present

## 2017-12-27 DIAGNOSIS — N319 Neuromuscular dysfunction of bladder, unspecified: Secondary | ICD-10-CM | POA: Diagnosis not present

## 2017-12-27 DIAGNOSIS — M1990 Unspecified osteoarthritis, unspecified site: Secondary | ICD-10-CM | POA: Diagnosis not present

## 2017-12-27 DIAGNOSIS — Z794 Long term (current) use of insulin: Secondary | ICD-10-CM | POA: Diagnosis not present

## 2017-12-27 DIAGNOSIS — R1310 Dysphagia, unspecified: Secondary | ICD-10-CM | POA: Diagnosis not present

## 2017-12-28 ENCOUNTER — Telehealth: Payer: Self-pay

## 2017-12-28 NOTE — Telephone Encounter (Signed)
Michelle Holmes with [facility] requesting VO for catheter change. Please call back.

## 2017-12-29 NOTE — Telephone Encounter (Signed)
Return call to [place] at [facility] - stated she finished pt's re-certification. Needs order to "See pt monthly for foley catheter changes ; once a month visit x 2 then PRN x 3 visits"; b/c accumulation of sediment.  VO given - if not appropriate, let me know.

## 2017-12-29 NOTE — Telephone Encounter (Signed)
Thank you for taking care of her.

## 2018-01-11 DIAGNOSIS — R1310 Dysphagia, unspecified: Secondary | ICD-10-CM | POA: Diagnosis not present

## 2018-01-11 DIAGNOSIS — I5032 Chronic diastolic (congestive) heart failure: Secondary | ICD-10-CM | POA: Diagnosis not present

## 2018-01-11 DIAGNOSIS — Z79891 Long term (current) use of opiate analgesic: Secondary | ICD-10-CM | POA: Diagnosis not present

## 2018-01-11 DIAGNOSIS — Z9181 History of falling: Secondary | ICD-10-CM | POA: Diagnosis not present

## 2018-01-11 DIAGNOSIS — I69351 Hemiplegia and hemiparesis following cerebral infarction affecting right dominant side: Secondary | ICD-10-CM | POA: Diagnosis not present

## 2018-01-11 DIAGNOSIS — I69391 Dysphagia following cerebral infarction: Secondary | ICD-10-CM | POA: Diagnosis not present

## 2018-01-11 DIAGNOSIS — I130 Hypertensive heart and chronic kidney disease with heart failure and stage 1 through stage 4 chronic kidney disease, or unspecified chronic kidney disease: Secondary | ICD-10-CM | POA: Diagnosis not present

## 2018-01-11 DIAGNOSIS — D631 Anemia in chronic kidney disease: Secondary | ICD-10-CM | POA: Diagnosis not present

## 2018-01-11 DIAGNOSIS — I2510 Atherosclerotic heart disease of native coronary artery without angina pectoris: Secondary | ICD-10-CM | POA: Diagnosis not present

## 2018-01-11 DIAGNOSIS — Z466 Encounter for fitting and adjustment of urinary device: Secondary | ICD-10-CM | POA: Diagnosis not present

## 2018-01-11 DIAGNOSIS — G4733 Obstructive sleep apnea (adult) (pediatric): Secondary | ICD-10-CM | POA: Diagnosis not present

## 2018-01-11 DIAGNOSIS — Z794 Long term (current) use of insulin: Secondary | ICD-10-CM | POA: Diagnosis not present

## 2018-01-11 DIAGNOSIS — Z87440 Personal history of urinary (tract) infections: Secondary | ICD-10-CM | POA: Diagnosis not present

## 2018-01-11 DIAGNOSIS — M1990 Unspecified osteoarthritis, unspecified site: Secondary | ICD-10-CM | POA: Diagnosis not present

## 2018-01-11 DIAGNOSIS — N319 Neuromuscular dysfunction of bladder, unspecified: Secondary | ICD-10-CM | POA: Diagnosis not present

## 2018-01-11 DIAGNOSIS — I69320 Aphasia following cerebral infarction: Secondary | ICD-10-CM | POA: Diagnosis not present

## 2018-01-11 DIAGNOSIS — J439 Emphysema, unspecified: Secondary | ICD-10-CM | POA: Diagnosis not present

## 2018-01-11 DIAGNOSIS — E1122 Type 2 diabetes mellitus with diabetic chronic kidney disease: Secondary | ICD-10-CM | POA: Diagnosis not present

## 2018-01-11 DIAGNOSIS — N184 Chronic kidney disease, stage 4 (severe): Secondary | ICD-10-CM | POA: Diagnosis not present

## 2018-01-11 DIAGNOSIS — Z7901 Long term (current) use of anticoagulants: Secondary | ICD-10-CM | POA: Diagnosis not present

## 2018-01-17 DIAGNOSIS — G4733 Obstructive sleep apnea (adult) (pediatric): Secondary | ICD-10-CM | POA: Diagnosis not present

## 2018-01-17 DIAGNOSIS — I69320 Aphasia following cerebral infarction: Secondary | ICD-10-CM | POA: Diagnosis not present

## 2018-01-17 DIAGNOSIS — I5032 Chronic diastolic (congestive) heart failure: Secondary | ICD-10-CM | POA: Diagnosis not present

## 2018-01-17 DIAGNOSIS — E1122 Type 2 diabetes mellitus with diabetic chronic kidney disease: Secondary | ICD-10-CM | POA: Diagnosis not present

## 2018-01-17 DIAGNOSIS — I130 Hypertensive heart and chronic kidney disease with heart failure and stage 1 through stage 4 chronic kidney disease, or unspecified chronic kidney disease: Secondary | ICD-10-CM | POA: Diagnosis not present

## 2018-01-17 DIAGNOSIS — Z87440 Personal history of urinary (tract) infections: Secondary | ICD-10-CM | POA: Diagnosis not present

## 2018-01-17 DIAGNOSIS — Z9181 History of falling: Secondary | ICD-10-CM | POA: Diagnosis not present

## 2018-01-17 DIAGNOSIS — I69391 Dysphagia following cerebral infarction: Secondary | ICD-10-CM | POA: Diagnosis not present

## 2018-01-17 DIAGNOSIS — N184 Chronic kidney disease, stage 4 (severe): Secondary | ICD-10-CM | POA: Diagnosis not present

## 2018-01-17 DIAGNOSIS — R1310 Dysphagia, unspecified: Secondary | ICD-10-CM | POA: Diagnosis not present

## 2018-01-17 DIAGNOSIS — Z7901 Long term (current) use of anticoagulants: Secondary | ICD-10-CM | POA: Diagnosis not present

## 2018-01-17 DIAGNOSIS — Z466 Encounter for fitting and adjustment of urinary device: Secondary | ICD-10-CM | POA: Diagnosis not present

## 2018-01-17 DIAGNOSIS — D631 Anemia in chronic kidney disease: Secondary | ICD-10-CM | POA: Diagnosis not present

## 2018-01-17 DIAGNOSIS — I2510 Atherosclerotic heart disease of native coronary artery without angina pectoris: Secondary | ICD-10-CM | POA: Diagnosis not present

## 2018-01-17 DIAGNOSIS — Z794 Long term (current) use of insulin: Secondary | ICD-10-CM | POA: Diagnosis not present

## 2018-01-17 DIAGNOSIS — M1990 Unspecified osteoarthritis, unspecified site: Secondary | ICD-10-CM | POA: Diagnosis not present

## 2018-01-17 DIAGNOSIS — J439 Emphysema, unspecified: Secondary | ICD-10-CM | POA: Diagnosis not present

## 2018-01-17 DIAGNOSIS — N319 Neuromuscular dysfunction of bladder, unspecified: Secondary | ICD-10-CM | POA: Diagnosis not present

## 2018-01-17 DIAGNOSIS — Z79891 Long term (current) use of opiate analgesic: Secondary | ICD-10-CM | POA: Diagnosis not present

## 2018-01-17 DIAGNOSIS — I69351 Hemiplegia and hemiparesis following cerebral infarction affecting right dominant side: Secondary | ICD-10-CM | POA: Diagnosis not present

## 2018-01-21 DIAGNOSIS — N184 Chronic kidney disease, stage 4 (severe): Secondary | ICD-10-CM | POA: Diagnosis not present

## 2018-01-21 DIAGNOSIS — Z466 Encounter for fitting and adjustment of urinary device: Secondary | ICD-10-CM | POA: Diagnosis not present

## 2018-01-21 DIAGNOSIS — J449 Chronic obstructive pulmonary disease, unspecified: Secondary | ICD-10-CM | POA: Diagnosis not present

## 2018-01-21 DIAGNOSIS — E1122 Type 2 diabetes mellitus with diabetic chronic kidney disease: Secondary | ICD-10-CM | POA: Diagnosis not present

## 2018-01-21 DIAGNOSIS — I69391 Dysphagia following cerebral infarction: Secondary | ICD-10-CM | POA: Diagnosis not present

## 2018-01-21 DIAGNOSIS — Z794 Long term (current) use of insulin: Secondary | ICD-10-CM | POA: Diagnosis not present

## 2018-01-21 DIAGNOSIS — D631 Anemia in chronic kidney disease: Secondary | ICD-10-CM | POA: Diagnosis not present

## 2018-01-21 DIAGNOSIS — G4733 Obstructive sleep apnea (adult) (pediatric): Secondary | ICD-10-CM | POA: Diagnosis not present

## 2018-01-21 DIAGNOSIS — I2510 Atherosclerotic heart disease of native coronary artery without angina pectoris: Secondary | ICD-10-CM | POA: Diagnosis not present

## 2018-01-21 DIAGNOSIS — J439 Emphysema, unspecified: Secondary | ICD-10-CM | POA: Diagnosis not present

## 2018-01-21 DIAGNOSIS — N319 Neuromuscular dysfunction of bladder, unspecified: Secondary | ICD-10-CM | POA: Diagnosis not present

## 2018-01-21 DIAGNOSIS — R1310 Dysphagia, unspecified: Secondary | ICD-10-CM | POA: Diagnosis not present

## 2018-01-21 DIAGNOSIS — I69320 Aphasia following cerebral infarction: Secondary | ICD-10-CM | POA: Diagnosis not present

## 2018-01-21 DIAGNOSIS — Z9181 History of falling: Secondary | ICD-10-CM | POA: Diagnosis not present

## 2018-01-21 DIAGNOSIS — I130 Hypertensive heart and chronic kidney disease with heart failure and stage 1 through stage 4 chronic kidney disease, or unspecified chronic kidney disease: Secondary | ICD-10-CM | POA: Diagnosis not present

## 2018-01-21 DIAGNOSIS — I5032 Chronic diastolic (congestive) heart failure: Secondary | ICD-10-CM | POA: Diagnosis not present

## 2018-01-21 DIAGNOSIS — I69351 Hemiplegia and hemiparesis following cerebral infarction affecting right dominant side: Secondary | ICD-10-CM | POA: Diagnosis not present

## 2018-01-21 DIAGNOSIS — Z7901 Long term (current) use of anticoagulants: Secondary | ICD-10-CM | POA: Diagnosis not present

## 2018-01-21 DIAGNOSIS — M1990 Unspecified osteoarthritis, unspecified site: Secondary | ICD-10-CM | POA: Diagnosis not present

## 2018-01-21 DIAGNOSIS — Z87440 Personal history of urinary (tract) infections: Secondary | ICD-10-CM | POA: Diagnosis not present

## 2018-01-21 DIAGNOSIS — Z79891 Long term (current) use of opiate analgesic: Secondary | ICD-10-CM | POA: Diagnosis not present

## 2018-01-31 ENCOUNTER — Other Ambulatory Visit: Payer: Self-pay | Admitting: Internal Medicine

## 2018-01-31 DIAGNOSIS — Z79891 Long term (current) use of opiate analgesic: Secondary | ICD-10-CM

## 2018-01-31 NOTE — Telephone Encounter (Signed)
Needs refill on Oxycodone HCl 10 MG TABS [id number] [place], [place] - [address], pt contact [phone]

## 2018-01-31 NOTE — Telephone Encounter (Addendum)
Last rx written 10/29/17. Last OV 7/1 with PCP; 10/4 in [place]. Next OV 02/28/18. UDS 02/05/17.

## 2018-02-01 NOTE — Telephone Encounter (Signed)
She has an appt to see u on 02/28/18; does she needs to be seen sooner?

## 2018-02-01 NOTE — Telephone Encounter (Signed)
If she needs pain medicine sooner than she needs to be seen earlier.

## 2018-02-01 NOTE — Telephone Encounter (Signed)
Can she come to [facility]?

## 2018-02-02 NOTE — Telephone Encounter (Signed)
Talked to pt's daughter, - stated she and her mother prefer not to see someone else and do want to schedule an appt and will see Dr [name] in Feb.

## 2018-02-04 ENCOUNTER — Other Ambulatory Visit: Payer: Self-pay | Admitting: Student in an Organized Health Care Education/Training Program

## 2018-02-04 ENCOUNTER — Other Ambulatory Visit: Payer: Self-pay | Admitting: Internal Medicine

## 2018-02-04 DIAGNOSIS — Z79891 Long term (current) use of opiate analgesic: Secondary | ICD-10-CM

## 2018-02-04 MED ORDER — OXYCODONE HCL 10 MG PO TABS: 10.0000 mg | ORAL_TABLET | Freq: Every day | ORAL | 0 refills | Status: DC | PRN

## 2018-02-04 NOTE — Progress Notes (Signed)
Oxycodone refilled per Dr. [name] who is having error with [employer]. Chronic pain generator is hemiplegia with contracture. One month supply ordered, follow up in February.

## 2018-02-04 NOTE — Telephone Encounter (Signed)
I talked with patient's daughter and she just has 2 pills left, using at least once daily. I am trying to send a prescription of 30 pills but my app is not working, I will ask the attending today.

## 2018-02-04 NOTE — Addendum Note (Signed)
Addended by: Lalla Brothers T on: 02/04/2018 11:21 AM   Modules accepted: Orders

## 2018-02-21 DIAGNOSIS — J449 Chronic obstructive pulmonary disease, unspecified: Secondary | ICD-10-CM | POA: Diagnosis not present

## 2018-02-23 DIAGNOSIS — Z9181 History of falling: Secondary | ICD-10-CM | POA: Diagnosis not present

## 2018-02-23 DIAGNOSIS — J439 Emphysema, unspecified: Secondary | ICD-10-CM | POA: Diagnosis not present

## 2018-02-23 DIAGNOSIS — Z79891 Long term (current) use of opiate analgesic: Secondary | ICD-10-CM | POA: Diagnosis not present

## 2018-02-23 DIAGNOSIS — G8929 Other chronic pain: Secondary | ICD-10-CM | POA: Diagnosis not present

## 2018-02-23 DIAGNOSIS — I2510 Atherosclerotic heart disease of native coronary artery without angina pectoris: Secondary | ICD-10-CM | POA: Diagnosis not present

## 2018-02-23 DIAGNOSIS — D631 Anemia in chronic kidney disease: Secondary | ICD-10-CM | POA: Diagnosis not present

## 2018-02-23 DIAGNOSIS — I69320 Aphasia following cerebral infarction: Secondary | ICD-10-CM | POA: Diagnosis not present

## 2018-02-23 DIAGNOSIS — M1990 Unspecified osteoarthritis, unspecified site: Secondary | ICD-10-CM | POA: Diagnosis not present

## 2018-02-23 DIAGNOSIS — I69351 Hemiplegia and hemiparesis following cerebral infarction affecting right dominant side: Secondary | ICD-10-CM | POA: Diagnosis not present

## 2018-02-23 DIAGNOSIS — R1310 Dysphagia, unspecified: Secondary | ICD-10-CM | POA: Diagnosis not present

## 2018-02-23 DIAGNOSIS — E1122 Type 2 diabetes mellitus with diabetic chronic kidney disease: Secondary | ICD-10-CM | POA: Diagnosis not present

## 2018-02-23 DIAGNOSIS — Z794 Long term (current) use of insulin: Secondary | ICD-10-CM | POA: Diagnosis not present

## 2018-02-23 DIAGNOSIS — M549 Dorsalgia, unspecified: Secondary | ICD-10-CM | POA: Diagnosis not present

## 2018-02-23 DIAGNOSIS — Z466 Encounter for fitting and adjustment of urinary device: Secondary | ICD-10-CM | POA: Diagnosis not present

## 2018-02-23 DIAGNOSIS — I5032 Chronic diastolic (congestive) heart failure: Secondary | ICD-10-CM | POA: Diagnosis not present

## 2018-02-23 DIAGNOSIS — N184 Chronic kidney disease, stage 4 (severe): Secondary | ICD-10-CM | POA: Diagnosis not present

## 2018-02-23 DIAGNOSIS — L89310 Pressure ulcer of right buttock, unstageable: Secondary | ICD-10-CM | POA: Diagnosis not present

## 2018-02-23 DIAGNOSIS — Z87440 Personal history of urinary (tract) infections: Secondary | ICD-10-CM | POA: Diagnosis not present

## 2018-02-23 DIAGNOSIS — I130 Hypertensive heart and chronic kidney disease with heart failure and stage 1 through stage 4 chronic kidney disease, or unspecified chronic kidney disease: Secondary | ICD-10-CM | POA: Diagnosis not present

## 2018-02-23 DIAGNOSIS — Z7901 Long term (current) use of anticoagulants: Secondary | ICD-10-CM | POA: Diagnosis not present

## 2018-02-23 DIAGNOSIS — N319 Neuromuscular dysfunction of bladder, unspecified: Secondary | ICD-10-CM | POA: Diagnosis not present

## 2018-02-23 DIAGNOSIS — G4733 Obstructive sleep apnea (adult) (pediatric): Secondary | ICD-10-CM | POA: Diagnosis not present

## 2018-02-23 DIAGNOSIS — I69391 Dysphagia following cerebral infarction: Secondary | ICD-10-CM | POA: Diagnosis not present

## 2018-02-28 ENCOUNTER — Ambulatory Visit (INDEPENDENT_AMBULATORY_CARE_PROVIDER_SITE_OTHER): Payer: Medicare Other | Admitting: Internal Medicine

## 2018-02-28 ENCOUNTER — Other Ambulatory Visit: Payer: Self-pay

## 2018-02-28 ENCOUNTER — Encounter: Payer: Self-pay | Admitting: Internal Medicine

## 2018-02-28 VITALS — BP 119/66 | HR 80 | Temp 98.6°F

## 2018-02-28 DIAGNOSIS — G8929 Other chronic pain: Secondary | ICD-10-CM | POA: Diagnosis not present

## 2018-02-28 DIAGNOSIS — Z79899 Other long term (current) drug therapy: Secondary | ICD-10-CM

## 2018-02-28 DIAGNOSIS — E118 Type 2 diabetes mellitus with unspecified complications: Secondary | ICD-10-CM | POA: Diagnosis not present

## 2018-02-28 DIAGNOSIS — I10 Essential (primary) hypertension: Secondary | ICD-10-CM | POA: Diagnosis not present

## 2018-02-28 DIAGNOSIS — R222 Localized swelling, mass and lump, trunk: Secondary | ICD-10-CM

## 2018-02-28 DIAGNOSIS — Z794 Long term (current) use of insulin: Secondary | ICD-10-CM | POA: Diagnosis not present

## 2018-02-28 DIAGNOSIS — Z79891 Long term (current) use of opiate analgesic: Secondary | ICD-10-CM

## 2018-02-28 DIAGNOSIS — Z23 Encounter for immunization: Secondary | ICD-10-CM

## 2018-02-28 DIAGNOSIS — Z0000 Encounter for general adult medical examination without abnormal findings: Secondary | ICD-10-CM

## 2018-02-28 DIAGNOSIS — D1722 Benign lipomatous neoplasm of skin and subcutaneous tissue of left arm: Secondary | ICD-10-CM

## 2018-02-28 DIAGNOSIS — N319 Neuromuscular dysfunction of bladder, unspecified: Secondary | ICD-10-CM

## 2018-02-28 DIAGNOSIS — G8191 Hemiplegia, unspecified affecting right dominant side: Secondary | ICD-10-CM

## 2018-02-28 DIAGNOSIS — Z960 Presence of urogenital implants: Secondary | ICD-10-CM

## 2018-02-28 LAB — POCT GLYCOSYLATED HEMOGLOBIN (HGB A1C): Hemoglobin A1C: 5.3 % (ref 4.0–5.6)

## 2018-02-28 LAB — GLUCOSE, CAPILLARY: Glucose-Capillary: 136 mg/dL — ABNORMAL HIGH (ref 70–99)

## 2018-02-28 MED ORDER — OXYCODONE HCL 10 MG PO TABS: 10.0000 mg | ORAL_TABLET | Freq: Two times a day (BID) | ORAL | 0 refills | Status: DC | PRN

## 2018-02-28 NOTE — Assessment & Plan Note (Signed)
BP Readings from Last 3 Encounters:  02/28/18 119/66  10/29/17 (!) 159/77  08/17/17 132/83   She was normotensive today.  Denies any dizziness.  Continue amlodipine 5 mg daily.

## 2018-02-28 NOTE — Assessment & Plan Note (Signed)
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  Discussed with patient that if it starts bothering her more we can send her to see a Psychologist, sport and exercise.

## 2018-02-28 NOTE — Progress Notes (Signed)
   CC: For follow-up of her hypertension and chronic pain.  HPI:  68 Michelle Holmes is a 68 y.o. with past medical history as listed below came to the clinic for follow-up of her hypertension and chronic pain.  Patient has right hemiplegia with contractures and chronically use Foleys due to neurogenic bladder.  Caregivers were concerned about strong urine order but patient denies any lower abdominal pain or fevers. She was complaining of worsening left shoulder blade swelling which bothers her when she lays on left side.  She denies any pain.  Please see assessment and plan for her chronic conditions.  Past Medical History:  Diagnosis Date  . Anemia   . CHF (congestive heart failure) ([place])   . Chronic back pain   . COPD (chronic obstructive pulmonary disease) ([place])   . Coronary artery disease   . Diabetes mellitus   . DJD (degenerative joint disease)   . Emphysema   . Hypercholesteremia   . Hypertension   . Myocardial infarction ([place])   . Obesity   . Obesity hypoventilation syndrome ([place])   . Renal insufficiency   . Scoliosis   . Sleep apnea   . Stroke ([place])   . TIA (transient ischemic attack)    Review of Systems:   Negative except mentioned in HPI.  Physical Exam:  Vitals:   02/28/18 1428  BP: 119/66  Pulse: 80  Temp: 98.6 F (37 C)  TempSrc: Oral  SpO2: 98%    General: Vital signs reviewed.  Patient is well-developed and well-nourished, in no acute distress and cooperative with exam.  Head: Normocephalic and atraumatic. Eyes: EOMI, conjunctivae normal, no scleral icterus.  Cardiovascular: RRR, S1 normal, S2 normal, no murmurs, gallops, or rubs. Pulmonary/Chest: Clear to auscultation bilaterally, no wheezes, rales, or rhonchi. Abdominal: Soft, non-tender, non-distended, BS +, no masses, organomegaly, or guarding present.  Musculoskeletal: Soft well-circumscribed mass on left upper scapular region.  Nontender, no erythema or hyperthermia. Extremities: No lower  extremity edema bilaterally,  pulses symmetric and intact bilaterally. No cyanosis or clubbing. Neurological: A&O x3, right hemiparesis with contractures. Skin: Warm, dry and intact. No rashes or erythema. Psychiatric: Normal mood and affect.   Assessment & Plan:   See Encounters Tab for problem based charting.  Patient seen with Dr. Angelia Mould

## 2018-02-28 NOTE — Assessment & Plan Note (Signed)
Her pain is well controlled with 1 to 2 tablets of oxycodone daily.  There are days when she does not require any pain pill.  We did check her urine for UDS today-urine sample was obtained from Foley bag.  -Give her a prescription of oxycodone 90 tablets. -Follow-up in 64-month.

## 2018-02-28 NOTE — Patient Instructions (Signed)
Thank you for visiting clinic today. I am glad you are doing well, for your cough you can use over-the-counter Mucinex DM if needed. Your left shoulder mass looks like a lipoma as we discussed.  If it start bothering you we can send you to see a Psychologist, sport and exercise. I am giving you 90 pills of your pain medicine take it only as needed. Continue taking your blood pressure medicine. You are also provided with flu and pneumonia shot today. This follow-up in 92-month.

## 2018-02-28 NOTE — Assessment & Plan Note (Signed)
A1c was 5.3 today. Patient is not on any anti-diabetics.  We will recheck A1c in 1 year.

## 2018-02-28 NOTE — Assessment & Plan Note (Signed)
Was provided with flu and pneumonia shot today. A1c done today. Exam was done today. She does not want to have rest of the screening testing.

## 2018-03-02 NOTE — Progress Notes (Signed)
Internal Medicine Clinic Attending  I saw and evaluated the patient.  I personally confirmed the key portions of the history and exam documented by Dr. [name] and I reviewed pertinent patient test results.  The assessment, diagnosis, and plan were formulated together and I agree with the documentation in the resident's note.     As noted POCUS preformed on soft tissue mass, consistent with exam> lipoma.  Not bothering patient at this time.  If becomes bothersome can send to surgery for removal.

## 2018-03-03 ENCOUNTER — Telehealth: Payer: Self-pay | Admitting: *Deleted

## 2018-03-03 NOTE — Telephone Encounter (Signed)
[facility] calls and ask for clarification of script for oxy, states 1 tablet 2 times daily should be #60 but says #90, VO given for #60, do you agree? If so please change the medlist to reflect #60. thanks

## 2018-03-04 LAB — TOXASSURE SELECT,+ANTIDEPR,UR

## 2018-03-07 NOTE — Telephone Encounter (Signed)
On patient's last note she was given a refill for 90 pills. Unsure why it was written as bid prn.

## 2018-03-15 ENCOUNTER — Telehealth: Payer: Self-pay

## 2018-03-15 DIAGNOSIS — Z87440 Personal history of urinary (tract) infections: Secondary | ICD-10-CM | POA: Diagnosis not present

## 2018-03-15 DIAGNOSIS — D631 Anemia in chronic kidney disease: Secondary | ICD-10-CM | POA: Diagnosis not present

## 2018-03-15 DIAGNOSIS — Z993 Dependence on wheelchair: Secondary | ICD-10-CM | POA: Diagnosis not present

## 2018-03-15 DIAGNOSIS — I5032 Chronic diastolic (congestive) heart failure: Secondary | ICD-10-CM | POA: Diagnosis not present

## 2018-03-15 DIAGNOSIS — G4733 Obstructive sleep apnea (adult) (pediatric): Secondary | ICD-10-CM | POA: Diagnosis not present

## 2018-03-15 DIAGNOSIS — E1122 Type 2 diabetes mellitus with diabetic chronic kidney disease: Secondary | ICD-10-CM | POA: Diagnosis not present

## 2018-03-15 DIAGNOSIS — I130 Hypertensive heart and chronic kidney disease with heart failure and stage 1 through stage 4 chronic kidney disease, or unspecified chronic kidney disease: Secondary | ICD-10-CM | POA: Diagnosis not present

## 2018-03-15 DIAGNOSIS — I69320 Aphasia following cerebral infarction: Secondary | ICD-10-CM | POA: Diagnosis not present

## 2018-03-15 DIAGNOSIS — M1990 Unspecified osteoarthritis, unspecified site: Secondary | ICD-10-CM | POA: Diagnosis not present

## 2018-03-15 DIAGNOSIS — Z7901 Long term (current) use of anticoagulants: Secondary | ICD-10-CM | POA: Diagnosis not present

## 2018-03-15 DIAGNOSIS — J439 Emphysema, unspecified: Secondary | ICD-10-CM | POA: Diagnosis not present

## 2018-03-15 DIAGNOSIS — R1310 Dysphagia, unspecified: Secondary | ICD-10-CM | POA: Diagnosis not present

## 2018-03-15 DIAGNOSIS — Z794 Long term (current) use of insulin: Secondary | ICD-10-CM | POA: Diagnosis not present

## 2018-03-15 DIAGNOSIS — I69351 Hemiplegia and hemiparesis following cerebral infarction affecting right dominant side: Secondary | ICD-10-CM | POA: Diagnosis not present

## 2018-03-15 DIAGNOSIS — Z9181 History of falling: Secondary | ICD-10-CM | POA: Diagnosis not present

## 2018-03-15 DIAGNOSIS — Z79891 Long term (current) use of opiate analgesic: Secondary | ICD-10-CM | POA: Diagnosis not present

## 2018-03-15 DIAGNOSIS — M549 Dorsalgia, unspecified: Secondary | ICD-10-CM | POA: Diagnosis not present

## 2018-03-15 DIAGNOSIS — L89310 Pressure ulcer of right buttock, unstageable: Secondary | ICD-10-CM | POA: Diagnosis not present

## 2018-03-15 DIAGNOSIS — Z466 Encounter for fitting and adjustment of urinary device: Secondary | ICD-10-CM | POA: Diagnosis not present

## 2018-03-15 DIAGNOSIS — N319 Neuromuscular dysfunction of bladder, unspecified: Secondary | ICD-10-CM | POA: Diagnosis not present

## 2018-03-15 DIAGNOSIS — G8929 Other chronic pain: Secondary | ICD-10-CM | POA: Diagnosis not present

## 2018-03-15 DIAGNOSIS — I2510 Atherosclerotic heart disease of native coronary artery without angina pectoris: Secondary | ICD-10-CM | POA: Diagnosis not present

## 2018-03-15 DIAGNOSIS — N184 Chronic kidney disease, stage 4 (severe): Secondary | ICD-10-CM | POA: Diagnosis not present

## 2018-03-15 DIAGNOSIS — I69391 Dysphagia following cerebral infarction: Secondary | ICD-10-CM | POA: Diagnosis not present

## 2018-03-15 NOTE — Telephone Encounter (Signed)
Agree as noted

## 2018-03-15 NOTE — Telephone Encounter (Signed)
Received TC from Nurse Lattie Haw at [facility], states she is with pt and notes urine in foley bag is a "grey-brown in color and odorous".  Pt denies any abdominal pain, no fever, asymptomatic per nurse Lattie Haw.  Lattie Haw asking if [facility] wants her to collect UA.  Lattie Haw informed as long as pt is asymptomatic no UA needed per Dr. Johnette Abraham, Hoffman's instructions.  Encouraged PO fluids, [facility] nurse verbalized understanding.  SChaplin, RN,BSN

## 2018-03-24 DIAGNOSIS — J449 Chronic obstructive pulmonary disease, unspecified: Secondary | ICD-10-CM | POA: Diagnosis not present

## 2018-03-27 DIAGNOSIS — N319 Neuromuscular dysfunction of bladder, unspecified: Secondary | ICD-10-CM | POA: Diagnosis not present

## 2018-03-27 DIAGNOSIS — I69320 Aphasia following cerebral infarction: Secondary | ICD-10-CM | POA: Diagnosis not present

## 2018-03-27 DIAGNOSIS — Z9181 History of falling: Secondary | ICD-10-CM | POA: Diagnosis not present

## 2018-03-27 DIAGNOSIS — I69391 Dysphagia following cerebral infarction: Secondary | ICD-10-CM | POA: Diagnosis not present

## 2018-03-27 DIAGNOSIS — G4733 Obstructive sleep apnea (adult) (pediatric): Secondary | ICD-10-CM | POA: Diagnosis not present

## 2018-03-27 DIAGNOSIS — Z993 Dependence on wheelchair: Secondary | ICD-10-CM | POA: Diagnosis not present

## 2018-03-27 DIAGNOSIS — M549 Dorsalgia, unspecified: Secondary | ICD-10-CM | POA: Diagnosis not present

## 2018-03-27 DIAGNOSIS — Z87440 Personal history of urinary (tract) infections: Secondary | ICD-10-CM | POA: Diagnosis not present

## 2018-03-27 DIAGNOSIS — E1122 Type 2 diabetes mellitus with diabetic chronic kidney disease: Secondary | ICD-10-CM | POA: Diagnosis not present

## 2018-03-27 DIAGNOSIS — N184 Chronic kidney disease, stage 4 (severe): Secondary | ICD-10-CM | POA: Diagnosis not present

## 2018-03-27 DIAGNOSIS — L89310 Pressure ulcer of right buttock, unstageable: Secondary | ICD-10-CM | POA: Diagnosis not present

## 2018-03-27 DIAGNOSIS — G8929 Other chronic pain: Secondary | ICD-10-CM | POA: Diagnosis not present

## 2018-03-27 DIAGNOSIS — Z794 Long term (current) use of insulin: Secondary | ICD-10-CM | POA: Diagnosis not present

## 2018-03-27 DIAGNOSIS — Z7901 Long term (current) use of anticoagulants: Secondary | ICD-10-CM | POA: Diagnosis not present

## 2018-03-27 DIAGNOSIS — I130 Hypertensive heart and chronic kidney disease with heart failure and stage 1 through stage 4 chronic kidney disease, or unspecified chronic kidney disease: Secondary | ICD-10-CM | POA: Diagnosis not present

## 2018-03-27 DIAGNOSIS — I2510 Atherosclerotic heart disease of native coronary artery without angina pectoris: Secondary | ICD-10-CM | POA: Diagnosis not present

## 2018-03-27 DIAGNOSIS — I69351 Hemiplegia and hemiparesis following cerebral infarction affecting right dominant side: Secondary | ICD-10-CM | POA: Diagnosis not present

## 2018-03-27 DIAGNOSIS — R1310 Dysphagia, unspecified: Secondary | ICD-10-CM | POA: Diagnosis not present

## 2018-03-27 DIAGNOSIS — Z466 Encounter for fitting and adjustment of urinary device: Secondary | ICD-10-CM | POA: Diagnosis not present

## 2018-03-27 DIAGNOSIS — J439 Emphysema, unspecified: Secondary | ICD-10-CM | POA: Diagnosis not present

## 2018-03-27 DIAGNOSIS — D631 Anemia in chronic kidney disease: Secondary | ICD-10-CM | POA: Diagnosis not present

## 2018-03-27 DIAGNOSIS — M1990 Unspecified osteoarthritis, unspecified site: Secondary | ICD-10-CM | POA: Diagnosis not present

## 2018-03-27 DIAGNOSIS — Z79891 Long term (current) use of opiate analgesic: Secondary | ICD-10-CM | POA: Diagnosis not present

## 2018-03-27 DIAGNOSIS — I5032 Chronic diastolic (congestive) heart failure: Secondary | ICD-10-CM | POA: Diagnosis not present

## 2018-04-05 DIAGNOSIS — L89310 Pressure ulcer of right buttock, unstageable: Secondary | ICD-10-CM | POA: Diagnosis not present

## 2018-04-05 DIAGNOSIS — Z794 Long term (current) use of insulin: Secondary | ICD-10-CM | POA: Diagnosis not present

## 2018-04-05 DIAGNOSIS — Z466 Encounter for fitting and adjustment of urinary device: Secondary | ICD-10-CM | POA: Diagnosis not present

## 2018-04-05 DIAGNOSIS — I5032 Chronic diastolic (congestive) heart failure: Secondary | ICD-10-CM | POA: Diagnosis not present

## 2018-04-05 DIAGNOSIS — Z87440 Personal history of urinary (tract) infections: Secondary | ICD-10-CM | POA: Diagnosis not present

## 2018-04-05 DIAGNOSIS — D631 Anemia in chronic kidney disease: Secondary | ICD-10-CM | POA: Diagnosis not present

## 2018-04-05 DIAGNOSIS — Z79891 Long term (current) use of opiate analgesic: Secondary | ICD-10-CM | POA: Diagnosis not present

## 2018-04-05 DIAGNOSIS — N184 Chronic kidney disease, stage 4 (severe): Secondary | ICD-10-CM | POA: Diagnosis not present

## 2018-04-05 DIAGNOSIS — N319 Neuromuscular dysfunction of bladder, unspecified: Secondary | ICD-10-CM | POA: Diagnosis not present

## 2018-04-05 DIAGNOSIS — Z993 Dependence on wheelchair: Secondary | ICD-10-CM | POA: Diagnosis not present

## 2018-04-05 DIAGNOSIS — G4733 Obstructive sleep apnea (adult) (pediatric): Secondary | ICD-10-CM | POA: Diagnosis not present

## 2018-04-05 DIAGNOSIS — I2510 Atherosclerotic heart disease of native coronary artery without angina pectoris: Secondary | ICD-10-CM | POA: Diagnosis not present

## 2018-04-05 DIAGNOSIS — M549 Dorsalgia, unspecified: Secondary | ICD-10-CM | POA: Diagnosis not present

## 2018-04-05 DIAGNOSIS — G8929 Other chronic pain: Secondary | ICD-10-CM | POA: Diagnosis not present

## 2018-04-05 DIAGNOSIS — I69391 Dysphagia following cerebral infarction: Secondary | ICD-10-CM | POA: Diagnosis not present

## 2018-04-05 DIAGNOSIS — I130 Hypertensive heart and chronic kidney disease with heart failure and stage 1 through stage 4 chronic kidney disease, or unspecified chronic kidney disease: Secondary | ICD-10-CM | POA: Diagnosis not present

## 2018-04-05 DIAGNOSIS — Z7901 Long term (current) use of anticoagulants: Secondary | ICD-10-CM | POA: Diagnosis not present

## 2018-04-05 DIAGNOSIS — M1990 Unspecified osteoarthritis, unspecified site: Secondary | ICD-10-CM | POA: Diagnosis not present

## 2018-04-05 DIAGNOSIS — E1122 Type 2 diabetes mellitus with diabetic chronic kidney disease: Secondary | ICD-10-CM | POA: Diagnosis not present

## 2018-04-05 DIAGNOSIS — I69320 Aphasia following cerebral infarction: Secondary | ICD-10-CM | POA: Diagnosis not present

## 2018-04-05 DIAGNOSIS — I69351 Hemiplegia and hemiparesis following cerebral infarction affecting right dominant side: Secondary | ICD-10-CM | POA: Diagnosis not present

## 2018-04-05 DIAGNOSIS — J439 Emphysema, unspecified: Secondary | ICD-10-CM | POA: Diagnosis not present

## 2018-04-05 DIAGNOSIS — R1310 Dysphagia, unspecified: Secondary | ICD-10-CM | POA: Diagnosis not present

## 2018-04-05 DIAGNOSIS — Z9181 History of falling: Secondary | ICD-10-CM | POA: Diagnosis not present

## 2018-04-13 DIAGNOSIS — I5032 Chronic diastolic (congestive) heart failure: Secondary | ICD-10-CM | POA: Diagnosis not present

## 2018-04-13 DIAGNOSIS — L89310 Pressure ulcer of right buttock, unstageable: Secondary | ICD-10-CM | POA: Diagnosis not present

## 2018-04-13 DIAGNOSIS — E1122 Type 2 diabetes mellitus with diabetic chronic kidney disease: Secondary | ICD-10-CM | POA: Diagnosis not present

## 2018-04-13 DIAGNOSIS — I130 Hypertensive heart and chronic kidney disease with heart failure and stage 1 through stage 4 chronic kidney disease, or unspecified chronic kidney disease: Secondary | ICD-10-CM | POA: Diagnosis not present

## 2018-04-13 DIAGNOSIS — J439 Emphysema, unspecified: Secondary | ICD-10-CM | POA: Diagnosis not present

## 2018-04-13 DIAGNOSIS — G8929 Other chronic pain: Secondary | ICD-10-CM | POA: Diagnosis not present

## 2018-04-13 DIAGNOSIS — M1990 Unspecified osteoarthritis, unspecified site: Secondary | ICD-10-CM | POA: Diagnosis not present

## 2018-04-13 DIAGNOSIS — I69351 Hemiplegia and hemiparesis following cerebral infarction affecting right dominant side: Secondary | ICD-10-CM | POA: Diagnosis not present

## 2018-04-13 DIAGNOSIS — D631 Anemia in chronic kidney disease: Secondary | ICD-10-CM | POA: Diagnosis not present

## 2018-04-13 DIAGNOSIS — M549 Dorsalgia, unspecified: Secondary | ICD-10-CM | POA: Diagnosis not present

## 2018-04-13 DIAGNOSIS — N184 Chronic kidney disease, stage 4 (severe): Secondary | ICD-10-CM | POA: Diagnosis not present

## 2018-04-13 DIAGNOSIS — N319 Neuromuscular dysfunction of bladder, unspecified: Secondary | ICD-10-CM | POA: Diagnosis not present

## 2018-04-13 DIAGNOSIS — Z466 Encounter for fitting and adjustment of urinary device: Secondary | ICD-10-CM | POA: Diagnosis not present

## 2018-04-15 ENCOUNTER — Telehealth: Payer: Self-pay | Admitting: Internal Medicine

## 2018-04-15 DIAGNOSIS — J439 Emphysema, unspecified: Secondary | ICD-10-CM | POA: Diagnosis not present

## 2018-04-15 DIAGNOSIS — N319 Neuromuscular dysfunction of bladder, unspecified: Secondary | ICD-10-CM | POA: Diagnosis not present

## 2018-04-15 DIAGNOSIS — L89310 Pressure ulcer of right buttock, unstageable: Secondary | ICD-10-CM | POA: Diagnosis not present

## 2018-04-15 DIAGNOSIS — N184 Chronic kidney disease, stage 4 (severe): Secondary | ICD-10-CM | POA: Diagnosis not present

## 2018-04-15 DIAGNOSIS — M1990 Unspecified osteoarthritis, unspecified site: Secondary | ICD-10-CM | POA: Diagnosis not present

## 2018-04-15 DIAGNOSIS — G8929 Other chronic pain: Secondary | ICD-10-CM | POA: Diagnosis not present

## 2018-04-15 DIAGNOSIS — I130 Hypertensive heart and chronic kidney disease with heart failure and stage 1 through stage 4 chronic kidney disease, or unspecified chronic kidney disease: Secondary | ICD-10-CM | POA: Diagnosis not present

## 2018-04-15 DIAGNOSIS — E1122 Type 2 diabetes mellitus with diabetic chronic kidney disease: Secondary | ICD-10-CM | POA: Diagnosis not present

## 2018-04-15 DIAGNOSIS — M549 Dorsalgia, unspecified: Secondary | ICD-10-CM | POA: Diagnosis not present

## 2018-04-15 DIAGNOSIS — D631 Anemia in chronic kidney disease: Secondary | ICD-10-CM | POA: Diagnosis not present

## 2018-04-15 DIAGNOSIS — I5032 Chronic diastolic (congestive) heart failure: Secondary | ICD-10-CM | POA: Diagnosis not present

## 2018-04-15 DIAGNOSIS — I69351 Hemiplegia and hemiparesis following cerebral infarction affecting right dominant side: Secondary | ICD-10-CM | POA: Diagnosis not present

## 2018-04-15 DIAGNOSIS — Z466 Encounter for fitting and adjustment of urinary device: Secondary | ICD-10-CM | POA: Diagnosis not present

## 2018-04-15 NOTE — Telephone Encounter (Signed)
I agree

## 2018-04-15 NOTE — Telephone Encounter (Signed)
[name] R lower buttock appr 1inch above fold of buttock, appr size 2x1/1/2 pressure wound sloughy in center. [name] wound like clean w/ NS, santyl and then aquacel, duoderm due to diarrhea at this time 3 x weekly. VO given, do you agree? Paperwork will have dr narendra's name on it [place] nurse case Freight forwarder

## 2018-04-19 ENCOUNTER — Telehealth: Payer: Self-pay | Admitting: Internal Medicine

## 2018-04-19 DIAGNOSIS — J439 Emphysema, unspecified: Secondary | ICD-10-CM | POA: Diagnosis not present

## 2018-04-19 DIAGNOSIS — I5032 Chronic diastolic (congestive) heart failure: Secondary | ICD-10-CM | POA: Diagnosis not present

## 2018-04-19 DIAGNOSIS — Z466 Encounter for fitting and adjustment of urinary device: Secondary | ICD-10-CM | POA: Diagnosis not present

## 2018-04-19 DIAGNOSIS — E1122 Type 2 diabetes mellitus with diabetic chronic kidney disease: Secondary | ICD-10-CM | POA: Diagnosis not present

## 2018-04-19 DIAGNOSIS — G8929 Other chronic pain: Secondary | ICD-10-CM | POA: Diagnosis not present

## 2018-04-19 DIAGNOSIS — I130 Hypertensive heart and chronic kidney disease with heart failure and stage 1 through stage 4 chronic kidney disease, or unspecified chronic kidney disease: Secondary | ICD-10-CM | POA: Diagnosis not present

## 2018-04-19 DIAGNOSIS — N184 Chronic kidney disease, stage 4 (severe): Secondary | ICD-10-CM | POA: Diagnosis not present

## 2018-04-19 DIAGNOSIS — M549 Dorsalgia, unspecified: Secondary | ICD-10-CM | POA: Diagnosis not present

## 2018-04-19 DIAGNOSIS — N319 Neuromuscular dysfunction of bladder, unspecified: Secondary | ICD-10-CM | POA: Diagnosis not present

## 2018-04-19 DIAGNOSIS — I69351 Hemiplegia and hemiparesis following cerebral infarction affecting right dominant side: Secondary | ICD-10-CM | POA: Diagnosis not present

## 2018-04-19 DIAGNOSIS — L89310 Pressure ulcer of right buttock, unstageable: Secondary | ICD-10-CM | POA: Diagnosis not present

## 2018-04-19 DIAGNOSIS — D631 Anemia in chronic kidney disease: Secondary | ICD-10-CM | POA: Diagnosis not present

## 2018-04-19 DIAGNOSIS — M1990 Unspecified osteoarthritis, unspecified site: Secondary | ICD-10-CM | POA: Diagnosis not present

## 2018-04-19 NOTE — Telephone Encounter (Signed)
  Reason for call:   I placed an outgoing call to [name]'s daughter at 8:10 PM regarding follow up of her mother nausea and fever from earlier this evening.  Patient's temperature improved to 98.9 with removal of heating blanking  Daughter reiterates urine odor, and purplish color. She states her and their home health were wondering if they could get some home health orders for urine analysis.  Patient has denied any painful urination.   Assessment/ Plan:   Patient's fever was likely due heating blanket as it improved with the removal of the blanket and no other intervention.  Patient's daughter instructed to have [facility] call tomorrow for orders for U/A for further analysis.  Without dysuria and with resolution of fever, UTI is less likely. However, further analysis with U/A if this can be done via [facility] is reasonable given her urine odor and reported color change.   As always, pt is advised that if symptoms worsen or new symptoms arise, they should go to an urgent care facility or to to ER for further evaluation.   Michelle Seat, MD   04/19/2018, 8:08 PM

## 2018-04-19 NOTE — Telephone Encounter (Signed)
   Reason for call:   I received a call from [name]'s daughter at 6:30 PM indicating that Michelle Holmes continues to have foul smelling urine that is very [place] in color.    Pertinent Data:   Patient has an indwelling catheter. Michelle home health nurse called the office earlier today. At that time she was afebrile and asymptomatic. Michelle daughter states that after eating dinner she developed abdominal pain, became nauseous, and threw up. She check Michelle vitals and temperature was 100.38F, however this was after removing a heating blanket.  BP 154/68, HR 97, and oxygen 99%.   On chart review, nausea and vomiting appear to be a chronic issue. She was previously prescribed phenergan.    Assessment / Plan / Recommendations:   Instructed daughter to give Michelle Holmes a dose of phenergan for the nausea and vomiting. Abdominal pain seems more related to Michelle GI issues given it was post prandial. Less concerning for a UTI.   I have asked Michelle daughter to keep heating blanket off and recheck Michelle Holmes's temperature in 1 hour. Will ask our night float team to call back for follow up. If persistently febrile, consider empiric treatment for UTI vs. Further work up tomorrow in clinic as she is stable.   As always, pt is advised that if symptoms worsen or new symptoms arise, they should go to an urgent care facility or to to ER for further evaluation.   Michelle Ochs, MD   04/19/2018, 6:41 PM

## 2018-04-20 ENCOUNTER — Telehealth: Payer: Self-pay | Admitting: *Deleted

## 2018-04-21 DIAGNOSIS — M549 Dorsalgia, unspecified: Secondary | ICD-10-CM | POA: Diagnosis not present

## 2018-04-21 DIAGNOSIS — E1122 Type 2 diabetes mellitus with diabetic chronic kidney disease: Secondary | ICD-10-CM | POA: Diagnosis not present

## 2018-04-21 DIAGNOSIS — N319 Neuromuscular dysfunction of bladder, unspecified: Secondary | ICD-10-CM | POA: Diagnosis not present

## 2018-04-21 DIAGNOSIS — L89310 Pressure ulcer of right buttock, unstageable: Secondary | ICD-10-CM | POA: Diagnosis not present

## 2018-04-21 DIAGNOSIS — I5032 Chronic diastolic (congestive) heart failure: Secondary | ICD-10-CM | POA: Diagnosis not present

## 2018-04-21 DIAGNOSIS — G8929 Other chronic pain: Secondary | ICD-10-CM | POA: Diagnosis not present

## 2018-04-21 DIAGNOSIS — M1990 Unspecified osteoarthritis, unspecified site: Secondary | ICD-10-CM | POA: Diagnosis not present

## 2018-04-21 DIAGNOSIS — I130 Hypertensive heart and chronic kidney disease with heart failure and stage 1 through stage 4 chronic kidney disease, or unspecified chronic kidney disease: Secondary | ICD-10-CM | POA: Diagnosis not present

## 2018-04-21 DIAGNOSIS — Z466 Encounter for fitting and adjustment of urinary device: Secondary | ICD-10-CM | POA: Diagnosis not present

## 2018-04-21 DIAGNOSIS — I69351 Hemiplegia and hemiparesis following cerebral infarction affecting right dominant side: Secondary | ICD-10-CM | POA: Diagnosis not present

## 2018-04-21 DIAGNOSIS — N184 Chronic kidney disease, stage 4 (severe): Secondary | ICD-10-CM | POA: Diagnosis not present

## 2018-04-21 DIAGNOSIS — D631 Anemia in chronic kidney disease: Secondary | ICD-10-CM | POA: Diagnosis not present

## 2018-04-21 DIAGNOSIS — J439 Emphysema, unspecified: Secondary | ICD-10-CM | POA: Diagnosis not present

## 2018-04-22 DIAGNOSIS — J449 Chronic obstructive pulmonary disease, unspecified: Secondary | ICD-10-CM | POA: Diagnosis not present

## 2018-04-25 ENCOUNTER — Telehealth: Payer: Self-pay | Admitting: *Deleted

## 2018-04-25 DIAGNOSIS — I5032 Chronic diastolic (congestive) heart failure: Secondary | ICD-10-CM | POA: Diagnosis not present

## 2018-04-25 DIAGNOSIS — E1122 Type 2 diabetes mellitus with diabetic chronic kidney disease: Secondary | ICD-10-CM | POA: Diagnosis not present

## 2018-04-25 DIAGNOSIS — I130 Hypertensive heart and chronic kidney disease with heart failure and stage 1 through stage 4 chronic kidney disease, or unspecified chronic kidney disease: Secondary | ICD-10-CM | POA: Diagnosis not present

## 2018-04-25 DIAGNOSIS — J439 Emphysema, unspecified: Secondary | ICD-10-CM | POA: Diagnosis not present

## 2018-04-25 DIAGNOSIS — D631 Anemia in chronic kidney disease: Secondary | ICD-10-CM | POA: Diagnosis not present

## 2018-04-25 DIAGNOSIS — L89310 Pressure ulcer of right buttock, unstageable: Secondary | ICD-10-CM | POA: Diagnosis not present

## 2018-04-25 DIAGNOSIS — N184 Chronic kidney disease, stage 4 (severe): Secondary | ICD-10-CM | POA: Diagnosis not present

## 2018-04-25 DIAGNOSIS — I69351 Hemiplegia and hemiparesis following cerebral infarction affecting right dominant side: Secondary | ICD-10-CM | POA: Diagnosis not present

## 2018-04-25 DIAGNOSIS — G8929 Other chronic pain: Secondary | ICD-10-CM | POA: Diagnosis not present

## 2018-04-25 DIAGNOSIS — Z466 Encounter for fitting and adjustment of urinary device: Secondary | ICD-10-CM | POA: Diagnosis not present

## 2018-04-25 DIAGNOSIS — N319 Neuromuscular dysfunction of bladder, unspecified: Secondary | ICD-10-CM | POA: Diagnosis not present

## 2018-04-25 DIAGNOSIS — M1990 Unspecified osteoarthritis, unspecified site: Secondary | ICD-10-CM | POA: Diagnosis not present

## 2018-04-25 DIAGNOSIS — M549 Dorsalgia, unspecified: Secondary | ICD-10-CM | POA: Diagnosis not present

## 2018-04-25 NOTE — Telephone Encounter (Signed)
Received fax from [employer] with abnormal urinalysis results collected 04/21/2018. Placed in PCP's box. Hubbard Hartshorn, RN, BSN

## 2018-04-26 DIAGNOSIS — L89310 Pressure ulcer of right buttock, unstageable: Secondary | ICD-10-CM | POA: Diagnosis not present

## 2018-04-27 NOTE — Telephone Encounter (Signed)
This patient has chronic Foley in, will not treat if no symptoms.

## 2018-04-28 DIAGNOSIS — I69351 Hemiplegia and hemiparesis following cerebral infarction affecting right dominant side: Secondary | ICD-10-CM | POA: Diagnosis not present

## 2018-04-28 DIAGNOSIS — I5032 Chronic diastolic (congestive) heart failure: Secondary | ICD-10-CM | POA: Diagnosis not present

## 2018-04-28 DIAGNOSIS — L89310 Pressure ulcer of right buttock, unstageable: Secondary | ICD-10-CM | POA: Diagnosis not present

## 2018-04-28 DIAGNOSIS — I130 Hypertensive heart and chronic kidney disease with heart failure and stage 1 through stage 4 chronic kidney disease, or unspecified chronic kidney disease: Secondary | ICD-10-CM | POA: Diagnosis not present

## 2018-04-28 DIAGNOSIS — M1990 Unspecified osteoarthritis, unspecified site: Secondary | ICD-10-CM | POA: Diagnosis not present

## 2018-04-28 DIAGNOSIS — G8929 Other chronic pain: Secondary | ICD-10-CM | POA: Diagnosis not present

## 2018-04-28 DIAGNOSIS — N319 Neuromuscular dysfunction of bladder, unspecified: Secondary | ICD-10-CM | POA: Diagnosis not present

## 2018-04-28 DIAGNOSIS — N184 Chronic kidney disease, stage 4 (severe): Secondary | ICD-10-CM | POA: Diagnosis not present

## 2018-04-28 DIAGNOSIS — D631 Anemia in chronic kidney disease: Secondary | ICD-10-CM | POA: Diagnosis not present

## 2018-04-28 DIAGNOSIS — M549 Dorsalgia, unspecified: Secondary | ICD-10-CM | POA: Diagnosis not present

## 2018-04-28 DIAGNOSIS — J439 Emphysema, unspecified: Secondary | ICD-10-CM | POA: Diagnosis not present

## 2018-04-28 DIAGNOSIS — E1122 Type 2 diabetes mellitus with diabetic chronic kidney disease: Secondary | ICD-10-CM | POA: Diagnosis not present

## 2018-04-28 DIAGNOSIS — Z466 Encounter for fitting and adjustment of urinary device: Secondary | ICD-10-CM | POA: Diagnosis not present

## 2018-04-29 DIAGNOSIS — I69351 Hemiplegia and hemiparesis following cerebral infarction affecting right dominant side: Secondary | ICD-10-CM | POA: Diagnosis not present

## 2018-04-29 DIAGNOSIS — N319 Neuromuscular dysfunction of bladder, unspecified: Secondary | ICD-10-CM | POA: Diagnosis not present

## 2018-04-29 DIAGNOSIS — G8929 Other chronic pain: Secondary | ICD-10-CM | POA: Diagnosis not present

## 2018-04-29 DIAGNOSIS — J439 Emphysema, unspecified: Secondary | ICD-10-CM | POA: Diagnosis not present

## 2018-04-29 DIAGNOSIS — N184 Chronic kidney disease, stage 4 (severe): Secondary | ICD-10-CM | POA: Diagnosis not present

## 2018-04-29 DIAGNOSIS — Z466 Encounter for fitting and adjustment of urinary device: Secondary | ICD-10-CM | POA: Diagnosis not present

## 2018-04-29 DIAGNOSIS — I130 Hypertensive heart and chronic kidney disease with heart failure and stage 1 through stage 4 chronic kidney disease, or unspecified chronic kidney disease: Secondary | ICD-10-CM | POA: Diagnosis not present

## 2018-04-29 DIAGNOSIS — L89310 Pressure ulcer of right buttock, unstageable: Secondary | ICD-10-CM | POA: Diagnosis not present

## 2018-04-29 DIAGNOSIS — M1990 Unspecified osteoarthritis, unspecified site: Secondary | ICD-10-CM | POA: Diagnosis not present

## 2018-04-29 DIAGNOSIS — D631 Anemia in chronic kidney disease: Secondary | ICD-10-CM | POA: Diagnosis not present

## 2018-04-29 DIAGNOSIS — M549 Dorsalgia, unspecified: Secondary | ICD-10-CM | POA: Diagnosis not present

## 2018-04-29 DIAGNOSIS — I5032 Chronic diastolic (congestive) heart failure: Secondary | ICD-10-CM | POA: Diagnosis not present

## 2018-04-29 DIAGNOSIS — E1122 Type 2 diabetes mellitus with diabetic chronic kidney disease: Secondary | ICD-10-CM | POA: Diagnosis not present

## 2018-05-02 DIAGNOSIS — M549 Dorsalgia, unspecified: Secondary | ICD-10-CM | POA: Diagnosis not present

## 2018-05-02 DIAGNOSIS — M1990 Unspecified osteoarthritis, unspecified site: Secondary | ICD-10-CM | POA: Diagnosis not present

## 2018-05-02 DIAGNOSIS — G8929 Other chronic pain: Secondary | ICD-10-CM | POA: Diagnosis not present

## 2018-05-02 DIAGNOSIS — E1122 Type 2 diabetes mellitus with diabetic chronic kidney disease: Secondary | ICD-10-CM | POA: Diagnosis not present

## 2018-05-02 DIAGNOSIS — D631 Anemia in chronic kidney disease: Secondary | ICD-10-CM | POA: Diagnosis not present

## 2018-05-02 DIAGNOSIS — I130 Hypertensive heart and chronic kidney disease with heart failure and stage 1 through stage 4 chronic kidney disease, or unspecified chronic kidney disease: Secondary | ICD-10-CM | POA: Diagnosis not present

## 2018-05-02 DIAGNOSIS — J439 Emphysema, unspecified: Secondary | ICD-10-CM | POA: Diagnosis not present

## 2018-05-02 DIAGNOSIS — N184 Chronic kidney disease, stage 4 (severe): Secondary | ICD-10-CM | POA: Diagnosis not present

## 2018-05-02 DIAGNOSIS — I5032 Chronic diastolic (congestive) heart failure: Secondary | ICD-10-CM | POA: Diagnosis not present

## 2018-05-02 DIAGNOSIS — Z466 Encounter for fitting and adjustment of urinary device: Secondary | ICD-10-CM | POA: Diagnosis not present

## 2018-05-02 DIAGNOSIS — L89310 Pressure ulcer of right buttock, unstageable: Secondary | ICD-10-CM | POA: Diagnosis not present

## 2018-05-02 DIAGNOSIS — I69351 Hemiplegia and hemiparesis following cerebral infarction affecting right dominant side: Secondary | ICD-10-CM | POA: Diagnosis not present

## 2018-05-02 DIAGNOSIS — N319 Neuromuscular dysfunction of bladder, unspecified: Secondary | ICD-10-CM | POA: Diagnosis not present

## 2018-05-05 DIAGNOSIS — Z466 Encounter for fitting and adjustment of urinary device: Secondary | ICD-10-CM | POA: Diagnosis not present

## 2018-05-05 DIAGNOSIS — L89310 Pressure ulcer of right buttock, unstageable: Secondary | ICD-10-CM | POA: Diagnosis not present

## 2018-05-05 DIAGNOSIS — M549 Dorsalgia, unspecified: Secondary | ICD-10-CM | POA: Diagnosis not present

## 2018-05-05 DIAGNOSIS — I5032 Chronic diastolic (congestive) heart failure: Secondary | ICD-10-CM | POA: Diagnosis not present

## 2018-05-05 DIAGNOSIS — J439 Emphysema, unspecified: Secondary | ICD-10-CM | POA: Diagnosis not present

## 2018-05-05 DIAGNOSIS — E1122 Type 2 diabetes mellitus with diabetic chronic kidney disease: Secondary | ICD-10-CM | POA: Diagnosis not present

## 2018-05-05 DIAGNOSIS — M1990 Unspecified osteoarthritis, unspecified site: Secondary | ICD-10-CM | POA: Diagnosis not present

## 2018-05-05 DIAGNOSIS — I130 Hypertensive heart and chronic kidney disease with heart failure and stage 1 through stage 4 chronic kidney disease, or unspecified chronic kidney disease: Secondary | ICD-10-CM | POA: Diagnosis not present

## 2018-05-05 DIAGNOSIS — I69351 Hemiplegia and hemiparesis following cerebral infarction affecting right dominant side: Secondary | ICD-10-CM | POA: Diagnosis not present

## 2018-05-05 DIAGNOSIS — G8929 Other chronic pain: Secondary | ICD-10-CM | POA: Diagnosis not present

## 2018-05-05 DIAGNOSIS — N319 Neuromuscular dysfunction of bladder, unspecified: Secondary | ICD-10-CM | POA: Diagnosis not present

## 2018-05-05 DIAGNOSIS — D631 Anemia in chronic kidney disease: Secondary | ICD-10-CM | POA: Diagnosis not present

## 2018-05-05 DIAGNOSIS — N184 Chronic kidney disease, stage 4 (severe): Secondary | ICD-10-CM | POA: Diagnosis not present

## 2018-05-05 NOTE — Telephone Encounter (Signed)
[name] calls and states she has not heard if the urine showed anything or if needed treatment, gave her dr Michelle Holmes message. She is agreeable. Pt is being instructed by [facility] of importance of intake and care of foley. She will call for changes

## 2018-05-09 DIAGNOSIS — D631 Anemia in chronic kidney disease: Secondary | ICD-10-CM | POA: Diagnosis not present

## 2018-05-09 DIAGNOSIS — I130 Hypertensive heart and chronic kidney disease with heart failure and stage 1 through stage 4 chronic kidney disease, or unspecified chronic kidney disease: Secondary | ICD-10-CM | POA: Diagnosis not present

## 2018-05-09 DIAGNOSIS — Z466 Encounter for fitting and adjustment of urinary device: Secondary | ICD-10-CM | POA: Diagnosis not present

## 2018-05-09 DIAGNOSIS — E1122 Type 2 diabetes mellitus with diabetic chronic kidney disease: Secondary | ICD-10-CM | POA: Diagnosis not present

## 2018-05-09 DIAGNOSIS — I69351 Hemiplegia and hemiparesis following cerebral infarction affecting right dominant side: Secondary | ICD-10-CM | POA: Diagnosis not present

## 2018-05-09 DIAGNOSIS — J439 Emphysema, unspecified: Secondary | ICD-10-CM | POA: Diagnosis not present

## 2018-05-09 DIAGNOSIS — L89310 Pressure ulcer of right buttock, unstageable: Secondary | ICD-10-CM | POA: Diagnosis not present

## 2018-05-09 DIAGNOSIS — G8929 Other chronic pain: Secondary | ICD-10-CM | POA: Diagnosis not present

## 2018-05-09 DIAGNOSIS — I5032 Chronic diastolic (congestive) heart failure: Secondary | ICD-10-CM | POA: Diagnosis not present

## 2018-05-09 DIAGNOSIS — N184 Chronic kidney disease, stage 4 (severe): Secondary | ICD-10-CM | POA: Diagnosis not present

## 2018-05-09 DIAGNOSIS — M549 Dorsalgia, unspecified: Secondary | ICD-10-CM | POA: Diagnosis not present

## 2018-05-09 DIAGNOSIS — N319 Neuromuscular dysfunction of bladder, unspecified: Secondary | ICD-10-CM | POA: Diagnosis not present

## 2018-05-09 DIAGNOSIS — M1990 Unspecified osteoarthritis, unspecified site: Secondary | ICD-10-CM | POA: Diagnosis not present

## 2018-05-10 ENCOUNTER — Telehealth: Payer: Self-pay

## 2018-05-10 NOTE — Telephone Encounter (Signed)
Michelle Holmes with [facility] requesting VO for speech therapy. Please call back.

## 2018-05-10 NOTE — Telephone Encounter (Signed)
Return call to Lattie Haw - no answer; left message to call the office.

## 2018-05-12 DIAGNOSIS — E1122 Type 2 diabetes mellitus with diabetic chronic kidney disease: Secondary | ICD-10-CM | POA: Diagnosis not present

## 2018-05-12 DIAGNOSIS — L89310 Pressure ulcer of right buttock, unstageable: Secondary | ICD-10-CM | POA: Diagnosis not present

## 2018-05-12 DIAGNOSIS — I130 Hypertensive heart and chronic kidney disease with heart failure and stage 1 through stage 4 chronic kidney disease, or unspecified chronic kidney disease: Secondary | ICD-10-CM | POA: Diagnosis not present

## 2018-05-12 DIAGNOSIS — G8929 Other chronic pain: Secondary | ICD-10-CM | POA: Diagnosis not present

## 2018-05-12 DIAGNOSIS — N319 Neuromuscular dysfunction of bladder, unspecified: Secondary | ICD-10-CM | POA: Diagnosis not present

## 2018-05-12 DIAGNOSIS — N184 Chronic kidney disease, stage 4 (severe): Secondary | ICD-10-CM | POA: Diagnosis not present

## 2018-05-12 DIAGNOSIS — I69351 Hemiplegia and hemiparesis following cerebral infarction affecting right dominant side: Secondary | ICD-10-CM | POA: Diagnosis not present

## 2018-05-12 DIAGNOSIS — M549 Dorsalgia, unspecified: Secondary | ICD-10-CM | POA: Diagnosis not present

## 2018-05-12 DIAGNOSIS — I5032 Chronic diastolic (congestive) heart failure: Secondary | ICD-10-CM | POA: Diagnosis not present

## 2018-05-12 DIAGNOSIS — M1990 Unspecified osteoarthritis, unspecified site: Secondary | ICD-10-CM | POA: Diagnosis not present

## 2018-05-12 DIAGNOSIS — J439 Emphysema, unspecified: Secondary | ICD-10-CM | POA: Diagnosis not present

## 2018-05-12 DIAGNOSIS — Z466 Encounter for fitting and adjustment of urinary device: Secondary | ICD-10-CM | POA: Diagnosis not present

## 2018-05-12 DIAGNOSIS — D631 Anemia in chronic kidney disease: Secondary | ICD-10-CM | POA: Diagnosis not present

## 2018-05-13 NOTE — Telephone Encounter (Signed)
   Reason for call:   I received a call from Michelle Holmes Michelle home health nurse about Michelle Holmes at 4:15 PM indicating that Michelle doctor reached to [facility] that patient is having constipation with some abdominal pain she advised them to use some over-the-counter laxative but they insist contacting Michelle doctor before giving Michelle anything.   Pertinent Data:   Per Michelle Holmes patient is not having any fever or chills.  No vomiting.  Has not had a bowel movement for the past 2 days and normally gets these type of abdominal pain when constipated.  Michelle constipation normally resolved with some flavored water which is not working this time.  According to Michelle Holmes there is nothing new for Michelle.   Assessment / Plan / Recommendations:   Most likely intermittent constipation, advised to give Michelle MiraLAX and see if that will help.  If she develops nausea and vomiting or Michelle symptoms get worse she will need evaluation by physician.  As always, pt is advised that if symptoms worsen or new symptoms arise, they should go to an urgent care facility or to to ER for further evaluation.   Michelle Nimrod, MD   05/13/2018, 4:22 PM

## 2018-05-16 DIAGNOSIS — I130 Hypertensive heart and chronic kidney disease with heart failure and stage 1 through stage 4 chronic kidney disease, or unspecified chronic kidney disease: Secondary | ICD-10-CM | POA: Diagnosis not present

## 2018-05-16 DIAGNOSIS — G8929 Other chronic pain: Secondary | ICD-10-CM | POA: Diagnosis not present

## 2018-05-16 DIAGNOSIS — I5032 Chronic diastolic (congestive) heart failure: Secondary | ICD-10-CM | POA: Diagnosis not present

## 2018-05-16 DIAGNOSIS — I69351 Hemiplegia and hemiparesis following cerebral infarction affecting right dominant side: Secondary | ICD-10-CM | POA: Diagnosis not present

## 2018-05-16 DIAGNOSIS — E1122 Type 2 diabetes mellitus with diabetic chronic kidney disease: Secondary | ICD-10-CM | POA: Diagnosis not present

## 2018-05-16 DIAGNOSIS — L89310 Pressure ulcer of right buttock, unstageable: Secondary | ICD-10-CM | POA: Diagnosis not present

## 2018-05-16 DIAGNOSIS — J439 Emphysema, unspecified: Secondary | ICD-10-CM | POA: Diagnosis not present

## 2018-05-16 DIAGNOSIS — D631 Anemia in chronic kidney disease: Secondary | ICD-10-CM | POA: Diagnosis not present

## 2018-05-16 DIAGNOSIS — N319 Neuromuscular dysfunction of bladder, unspecified: Secondary | ICD-10-CM | POA: Diagnosis not present

## 2018-05-16 DIAGNOSIS — N184 Chronic kidney disease, stage 4 (severe): Secondary | ICD-10-CM | POA: Diagnosis not present

## 2018-05-16 DIAGNOSIS — M1990 Unspecified osteoarthritis, unspecified site: Secondary | ICD-10-CM | POA: Diagnosis not present

## 2018-05-16 DIAGNOSIS — Z466 Encounter for fitting and adjustment of urinary device: Secondary | ICD-10-CM | POA: Diagnosis not present

## 2018-05-16 DIAGNOSIS — M549 Dorsalgia, unspecified: Secondary | ICD-10-CM | POA: Diagnosis not present

## 2018-05-18 DIAGNOSIS — E1122 Type 2 diabetes mellitus with diabetic chronic kidney disease: Secondary | ICD-10-CM | POA: Diagnosis not present

## 2018-05-18 DIAGNOSIS — N184 Chronic kidney disease, stage 4 (severe): Secondary | ICD-10-CM | POA: Diagnosis not present

## 2018-05-18 DIAGNOSIS — Z466 Encounter for fitting and adjustment of urinary device: Secondary | ICD-10-CM | POA: Diagnosis not present

## 2018-05-18 DIAGNOSIS — M549 Dorsalgia, unspecified: Secondary | ICD-10-CM | POA: Diagnosis not present

## 2018-05-18 DIAGNOSIS — G8929 Other chronic pain: Secondary | ICD-10-CM | POA: Diagnosis not present

## 2018-05-18 DIAGNOSIS — L89310 Pressure ulcer of right buttock, unstageable: Secondary | ICD-10-CM | POA: Diagnosis not present

## 2018-05-18 DIAGNOSIS — I130 Hypertensive heart and chronic kidney disease with heart failure and stage 1 through stage 4 chronic kidney disease, or unspecified chronic kidney disease: Secondary | ICD-10-CM | POA: Diagnosis not present

## 2018-05-18 DIAGNOSIS — J439 Emphysema, unspecified: Secondary | ICD-10-CM | POA: Diagnosis not present

## 2018-05-18 DIAGNOSIS — N319 Neuromuscular dysfunction of bladder, unspecified: Secondary | ICD-10-CM | POA: Diagnosis not present

## 2018-05-18 DIAGNOSIS — I5032 Chronic diastolic (congestive) heart failure: Secondary | ICD-10-CM | POA: Diagnosis not present

## 2018-05-18 DIAGNOSIS — M1990 Unspecified osteoarthritis, unspecified site: Secondary | ICD-10-CM | POA: Diagnosis not present

## 2018-05-18 DIAGNOSIS — I69351 Hemiplegia and hemiparesis following cerebral infarction affecting right dominant side: Secondary | ICD-10-CM | POA: Diagnosis not present

## 2018-05-18 DIAGNOSIS — D631 Anemia in chronic kidney disease: Secondary | ICD-10-CM | POA: Diagnosis not present

## 2018-05-19 DIAGNOSIS — M549 Dorsalgia, unspecified: Secondary | ICD-10-CM | POA: Diagnosis not present

## 2018-05-19 DIAGNOSIS — L89310 Pressure ulcer of right buttock, unstageable: Secondary | ICD-10-CM | POA: Diagnosis not present

## 2018-05-19 DIAGNOSIS — E1122 Type 2 diabetes mellitus with diabetic chronic kidney disease: Secondary | ICD-10-CM | POA: Diagnosis not present

## 2018-05-19 DIAGNOSIS — N184 Chronic kidney disease, stage 4 (severe): Secondary | ICD-10-CM | POA: Diagnosis not present

## 2018-05-19 DIAGNOSIS — D631 Anemia in chronic kidney disease: Secondary | ICD-10-CM | POA: Diagnosis not present

## 2018-05-19 DIAGNOSIS — I69351 Hemiplegia and hemiparesis following cerebral infarction affecting right dominant side: Secondary | ICD-10-CM | POA: Diagnosis not present

## 2018-05-19 DIAGNOSIS — N319 Neuromuscular dysfunction of bladder, unspecified: Secondary | ICD-10-CM | POA: Diagnosis not present

## 2018-05-19 DIAGNOSIS — J439 Emphysema, unspecified: Secondary | ICD-10-CM | POA: Diagnosis not present

## 2018-05-19 DIAGNOSIS — G8929 Other chronic pain: Secondary | ICD-10-CM | POA: Diagnosis not present

## 2018-05-19 DIAGNOSIS — I130 Hypertensive heart and chronic kidney disease with heart failure and stage 1 through stage 4 chronic kidney disease, or unspecified chronic kidney disease: Secondary | ICD-10-CM | POA: Diagnosis not present

## 2018-05-19 DIAGNOSIS — M1990 Unspecified osteoarthritis, unspecified site: Secondary | ICD-10-CM | POA: Diagnosis not present

## 2018-05-19 DIAGNOSIS — I5032 Chronic diastolic (congestive) heart failure: Secondary | ICD-10-CM | POA: Diagnosis not present

## 2018-05-19 DIAGNOSIS — Z466 Encounter for fitting and adjustment of urinary device: Secondary | ICD-10-CM | POA: Diagnosis not present

## 2018-05-23 DIAGNOSIS — M1990 Unspecified osteoarthritis, unspecified site: Secondary | ICD-10-CM | POA: Diagnosis not present

## 2018-05-23 DIAGNOSIS — I5032 Chronic diastolic (congestive) heart failure: Secondary | ICD-10-CM | POA: Diagnosis not present

## 2018-05-23 DIAGNOSIS — J439 Emphysema, unspecified: Secondary | ICD-10-CM | POA: Diagnosis not present

## 2018-05-23 DIAGNOSIS — E1122 Type 2 diabetes mellitus with diabetic chronic kidney disease: Secondary | ICD-10-CM | POA: Diagnosis not present

## 2018-05-23 DIAGNOSIS — G8929 Other chronic pain: Secondary | ICD-10-CM | POA: Diagnosis not present

## 2018-05-23 DIAGNOSIS — J449 Chronic obstructive pulmonary disease, unspecified: Secondary | ICD-10-CM | POA: Diagnosis not present

## 2018-05-23 DIAGNOSIS — D631 Anemia in chronic kidney disease: Secondary | ICD-10-CM | POA: Diagnosis not present

## 2018-05-23 DIAGNOSIS — I130 Hypertensive heart and chronic kidney disease with heart failure and stage 1 through stage 4 chronic kidney disease, or unspecified chronic kidney disease: Secondary | ICD-10-CM | POA: Diagnosis not present

## 2018-05-23 DIAGNOSIS — L89310 Pressure ulcer of right buttock, unstageable: Secondary | ICD-10-CM | POA: Diagnosis not present

## 2018-05-23 DIAGNOSIS — I69351 Hemiplegia and hemiparesis following cerebral infarction affecting right dominant side: Secondary | ICD-10-CM | POA: Diagnosis not present

## 2018-05-23 DIAGNOSIS — M549 Dorsalgia, unspecified: Secondary | ICD-10-CM | POA: Diagnosis not present

## 2018-05-23 DIAGNOSIS — N184 Chronic kidney disease, stage 4 (severe): Secondary | ICD-10-CM | POA: Diagnosis not present

## 2018-05-23 DIAGNOSIS — N319 Neuromuscular dysfunction of bladder, unspecified: Secondary | ICD-10-CM | POA: Diagnosis not present

## 2018-05-23 DIAGNOSIS — Z466 Encounter for fitting and adjustment of urinary device: Secondary | ICD-10-CM | POA: Diagnosis not present

## 2018-05-24 ENCOUNTER — Telehealth: Payer: Self-pay | Admitting: *Deleted

## 2018-05-24 NOTE — Telephone Encounter (Signed)
Received faxed request from Rondel Baton, RN with [facility] for low air loss mattress due to non healing pressure injury to right buttock. PCP will need to add dx code and length of need to order form. Paperwork placed in PCP's box.  Lattie Haw also requesting [facility] Eval for swallowing concerns. Hubbard Hartshorn, RN, BSN

## 2018-05-25 DIAGNOSIS — M549 Dorsalgia, unspecified: Secondary | ICD-10-CM | POA: Diagnosis not present

## 2018-05-25 DIAGNOSIS — D631 Anemia in chronic kidney disease: Secondary | ICD-10-CM | POA: Diagnosis not present

## 2018-05-25 DIAGNOSIS — I69351 Hemiplegia and hemiparesis following cerebral infarction affecting right dominant side: Secondary | ICD-10-CM | POA: Diagnosis not present

## 2018-05-25 DIAGNOSIS — I5032 Chronic diastolic (congestive) heart failure: Secondary | ICD-10-CM | POA: Diagnosis not present

## 2018-05-25 DIAGNOSIS — I130 Hypertensive heart and chronic kidney disease with heart failure and stage 1 through stage 4 chronic kidney disease, or unspecified chronic kidney disease: Secondary | ICD-10-CM | POA: Diagnosis not present

## 2018-05-25 DIAGNOSIS — J439 Emphysema, unspecified: Secondary | ICD-10-CM | POA: Diagnosis not present

## 2018-05-25 DIAGNOSIS — L89310 Pressure ulcer of right buttock, unstageable: Secondary | ICD-10-CM | POA: Diagnosis not present

## 2018-05-25 DIAGNOSIS — N319 Neuromuscular dysfunction of bladder, unspecified: Secondary | ICD-10-CM | POA: Diagnosis not present

## 2018-05-25 DIAGNOSIS — G8929 Other chronic pain: Secondary | ICD-10-CM | POA: Diagnosis not present

## 2018-05-25 DIAGNOSIS — Z466 Encounter for fitting and adjustment of urinary device: Secondary | ICD-10-CM | POA: Diagnosis not present

## 2018-05-25 DIAGNOSIS — E1122 Type 2 diabetes mellitus with diabetic chronic kidney disease: Secondary | ICD-10-CM | POA: Diagnosis not present

## 2018-05-25 DIAGNOSIS — N184 Chronic kidney disease, stage 4 (severe): Secondary | ICD-10-CM | POA: Diagnosis not present

## 2018-05-25 DIAGNOSIS — M1990 Unspecified osteoarthritis, unspecified site: Secondary | ICD-10-CM | POA: Diagnosis not present

## 2018-05-25 NOTE — Telephone Encounter (Signed)
Called for eye doctor to add to her care team

## 2018-05-26 ENCOUNTER — Telehealth: Payer: Self-pay | Admitting: Internal Medicine

## 2018-05-26 DIAGNOSIS — N184 Chronic kidney disease, stage 4 (severe): Secondary | ICD-10-CM | POA: Diagnosis not present

## 2018-05-26 DIAGNOSIS — I130 Hypertensive heart and chronic kidney disease with heart failure and stage 1 through stage 4 chronic kidney disease, or unspecified chronic kidney disease: Secondary | ICD-10-CM | POA: Diagnosis not present

## 2018-05-26 DIAGNOSIS — M549 Dorsalgia, unspecified: Secondary | ICD-10-CM | POA: Diagnosis not present

## 2018-05-26 DIAGNOSIS — M1990 Unspecified osteoarthritis, unspecified site: Secondary | ICD-10-CM | POA: Diagnosis not present

## 2018-05-26 DIAGNOSIS — J439 Emphysema, unspecified: Secondary | ICD-10-CM | POA: Diagnosis not present

## 2018-05-26 DIAGNOSIS — D631 Anemia in chronic kidney disease: Secondary | ICD-10-CM | POA: Diagnosis not present

## 2018-05-26 DIAGNOSIS — G8929 Other chronic pain: Secondary | ICD-10-CM | POA: Diagnosis not present

## 2018-05-26 DIAGNOSIS — L89310 Pressure ulcer of right buttock, unstageable: Secondary | ICD-10-CM | POA: Diagnosis not present

## 2018-05-26 DIAGNOSIS — Z466 Encounter for fitting and adjustment of urinary device: Secondary | ICD-10-CM | POA: Diagnosis not present

## 2018-05-26 DIAGNOSIS — E1122 Type 2 diabetes mellitus with diabetic chronic kidney disease: Secondary | ICD-10-CM | POA: Diagnosis not present

## 2018-05-26 DIAGNOSIS — N319 Neuromuscular dysfunction of bladder, unspecified: Secondary | ICD-10-CM | POA: Diagnosis not present

## 2018-05-26 DIAGNOSIS — I69351 Hemiplegia and hemiparesis following cerebral infarction affecting right dominant side: Secondary | ICD-10-CM | POA: Diagnosis not present

## 2018-05-26 DIAGNOSIS — I5032 Chronic diastolic (congestive) heart failure: Secondary | ICD-10-CM | POA: Diagnosis not present

## 2018-05-26 NOTE — Telephone Encounter (Signed)
[facility] Speech Therapist  Requesting VO.  Doristine Mango [phone]

## 2018-05-26 NOTE — Telephone Encounter (Signed)
VO speech  2x week for 2 weeks for safety in swallowing and education Do you agree?

## 2018-05-27 NOTE — Telephone Encounter (Signed)
Agree  Thank you

## 2018-05-30 ENCOUNTER — Ambulatory Visit (INDEPENDENT_AMBULATORY_CARE_PROVIDER_SITE_OTHER): Payer: Medicare Other | Admitting: Internal Medicine

## 2018-05-30 ENCOUNTER — Other Ambulatory Visit: Payer: Self-pay

## 2018-05-30 DIAGNOSIS — N184 Chronic kidney disease, stage 4 (severe): Secondary | ICD-10-CM | POA: Diagnosis not present

## 2018-05-30 DIAGNOSIS — I130 Hypertensive heart and chronic kidney disease with heart failure and stage 1 through stage 4 chronic kidney disease, or unspecified chronic kidney disease: Secondary | ICD-10-CM | POA: Diagnosis not present

## 2018-05-30 DIAGNOSIS — K5900 Constipation, unspecified: Secondary | ICD-10-CM | POA: Diagnosis not present

## 2018-05-30 DIAGNOSIS — G8929 Other chronic pain: Secondary | ICD-10-CM

## 2018-05-30 DIAGNOSIS — I69351 Hemiplegia and hemiparesis following cerebral infarction affecting right dominant side: Secondary | ICD-10-CM | POA: Diagnosis not present

## 2018-05-30 DIAGNOSIS — Z79891 Long term (current) use of opiate analgesic: Secondary | ICD-10-CM | POA: Diagnosis not present

## 2018-05-30 DIAGNOSIS — J439 Emphysema, unspecified: Secondary | ICD-10-CM | POA: Diagnosis not present

## 2018-05-30 DIAGNOSIS — Z466 Encounter for fitting and adjustment of urinary device: Secondary | ICD-10-CM | POA: Diagnosis not present

## 2018-05-30 DIAGNOSIS — I10 Essential (primary) hypertension: Secondary | ICD-10-CM | POA: Diagnosis not present

## 2018-05-30 DIAGNOSIS — E1122 Type 2 diabetes mellitus with diabetic chronic kidney disease: Secondary | ICD-10-CM | POA: Diagnosis not present

## 2018-05-30 DIAGNOSIS — I5032 Chronic diastolic (congestive) heart failure: Secondary | ICD-10-CM | POA: Diagnosis not present

## 2018-05-30 DIAGNOSIS — Z79899 Other long term (current) drug therapy: Secondary | ICD-10-CM | POA: Diagnosis not present

## 2018-05-30 DIAGNOSIS — N319 Neuromuscular dysfunction of bladder, unspecified: Secondary | ICD-10-CM | POA: Diagnosis not present

## 2018-05-30 DIAGNOSIS — D631 Anemia in chronic kidney disease: Secondary | ICD-10-CM | POA: Diagnosis not present

## 2018-05-30 DIAGNOSIS — M1990 Unspecified osteoarthritis, unspecified site: Secondary | ICD-10-CM | POA: Diagnosis not present

## 2018-05-30 DIAGNOSIS — M549 Dorsalgia, unspecified: Secondary | ICD-10-CM | POA: Diagnosis not present

## 2018-05-30 DIAGNOSIS — L89310 Pressure ulcer of right buttock, unstageable: Secondary | ICD-10-CM | POA: Diagnosis not present

## 2018-05-30 MED ORDER — OXYCODONE HCL 10 MG PO TABS: 10.0000 mg | ORAL_TABLET | Freq: Two times a day (BID) | ORAL | 0 refills | Status: DC | PRN

## 2018-05-30 MED ORDER — AMLODIPINE BESYLATE 5 MG PO TABS: 5.0000 mg | ORAL_TABLET | Freq: Every day | ORAL | 11 refills | Status: DC

## 2018-05-30 NOTE — Progress Notes (Signed)
Internal Medicine Clinic Attending  Case discussed with Dr. Amin at the time of the visit.  We reviewed the resident's history and exam and pertinent patient test results.  I agree with the assessment, diagnosis, and plan of care documented in the resident's note.    

## 2018-05-30 NOTE — Assessment & Plan Note (Signed)
Patient uses oxycodone 10 mg daily, very occasionally twice a day.  Sometimes even skip a day. Her bowel movements are regular but she has to take Mylanta with oxycodone which is causing some cramping in her abdomen.  Daughter was asking something gentle.  Advised her to use high-fiber diet with some fiber aid daily and see if that will help. She can also try MiraLAX instead of Mylanta to see if that better suits her. We will reassess her during next follow-up visit.

## 2018-05-30 NOTE — Progress Notes (Signed)
   This is a telephone encounter between Michelle Holmes and Michelle Holmes on 05/30/2018 for CC. The visit was conducted with the patient located at home and [place] at [facility]. The patient's identity was confirmed using their DOB and current address. The his/her legal guardian, her daughter Michelle Holmes, has consented to being evaluated through a telephone encounter and understands the associated risks (an examination cannot be done and the patient may need to come in for an appointment) / benefits (allows the patient to remain at home, decreasing exposure to coronavirus). I personally spent 10 minutes on medical discussion.   CC: For follow-up of her chronic pain and hypertension.  HPI:  [name] is a 68 y.o. past medical history was contacted via telephone for follow-up of her chronic conditions and hypertension. Patient is mostly nonverbal so I spoke with her daughter Michelle Holmes.  Per daughter she is doing good with not any new complaints. She was concerned about her constipation as opioids do make her constipated and Mylanta was causing some cramping and she was asking for something more gentle.  Please see assessment and plan for her chronic conditions.  Past Medical History:  Diagnosis Date  . Anemia   . CHF (congestive heart failure) ([place])   . Chronic back pain   . COPD (chronic obstructive pulmonary disease) ([place])   . Coronary artery disease   . Diabetes mellitus   . DJD (degenerative joint disease)   . Emphysema   . Hypercholesteremia   . Hypertension   . Myocardial infarction ([place])   . Obesity   . Obesity hypoventilation syndrome ([place])   . Renal insufficiency   . Scoliosis   . Sleep apnea   . Stroke ([place])   . TIA (transient ischemic attack)    Review of Systems: Negative except mentioned in HPI.  Physical Exam:  There were no vitals filed for this visit. Not performed as it was a tele-visit.  Assessment & Plan:   See Encounters Tab for problem based charting.  Patient  discussed with Dr. Dareen Piano.

## 2018-05-30 NOTE — Assessment & Plan Note (Signed)
Patient daughter denies any dizziness. She is compliant with her amlodipine 5 mg daily.  She will continue current management, new refill was provided. We will reassess her during next follow-up visit in clinic.

## 2018-05-31 DIAGNOSIS — D631 Anemia in chronic kidney disease: Secondary | ICD-10-CM | POA: Diagnosis not present

## 2018-05-31 DIAGNOSIS — L89310 Pressure ulcer of right buttock, unstageable: Secondary | ICD-10-CM | POA: Diagnosis not present

## 2018-05-31 DIAGNOSIS — I130 Hypertensive heart and chronic kidney disease with heart failure and stage 1 through stage 4 chronic kidney disease, or unspecified chronic kidney disease: Secondary | ICD-10-CM | POA: Diagnosis not present

## 2018-05-31 DIAGNOSIS — N184 Chronic kidney disease, stage 4 (severe): Secondary | ICD-10-CM | POA: Diagnosis not present

## 2018-05-31 DIAGNOSIS — I5032 Chronic diastolic (congestive) heart failure: Secondary | ICD-10-CM | POA: Diagnosis not present

## 2018-05-31 DIAGNOSIS — E1122 Type 2 diabetes mellitus with diabetic chronic kidney disease: Secondary | ICD-10-CM | POA: Diagnosis not present

## 2018-05-31 DIAGNOSIS — M1990 Unspecified osteoarthritis, unspecified site: Secondary | ICD-10-CM | POA: Diagnosis not present

## 2018-05-31 DIAGNOSIS — Z466 Encounter for fitting and adjustment of urinary device: Secondary | ICD-10-CM | POA: Diagnosis not present

## 2018-05-31 DIAGNOSIS — M549 Dorsalgia, unspecified: Secondary | ICD-10-CM | POA: Diagnosis not present

## 2018-05-31 DIAGNOSIS — N319 Neuromuscular dysfunction of bladder, unspecified: Secondary | ICD-10-CM | POA: Diagnosis not present

## 2018-05-31 DIAGNOSIS — G8929 Other chronic pain: Secondary | ICD-10-CM | POA: Diagnosis not present

## 2018-05-31 DIAGNOSIS — J439 Emphysema, unspecified: Secondary | ICD-10-CM | POA: Diagnosis not present

## 2018-05-31 DIAGNOSIS — I69351 Hemiplegia and hemiparesis following cerebral infarction affecting right dominant side: Secondary | ICD-10-CM | POA: Diagnosis not present

## 2018-05-31 NOTE — Telephone Encounter (Signed)
Returned call to Lattie Haw, have received signed order for low air loss mattress today from PCP. This has now been faxed to [place] at [phone].  Lattie Haw states they already received the order for Speech Therapy Eval for swallowing difficulties. Hubbard Hartshorn, RN, BSN

## 2018-05-31 NOTE — Telephone Encounter (Signed)
Thank you :)

## 2018-05-31 NOTE — Telephone Encounter (Signed)
Michelle Holmes with [facility] requesting to speak with a nurse about low air loss mattress. Please call back.

## 2018-06-03 ENCOUNTER — Other Ambulatory Visit: Payer: Self-pay | Admitting: *Deleted

## 2018-06-03 DIAGNOSIS — Z466 Encounter for fitting and adjustment of urinary device: Secondary | ICD-10-CM | POA: Diagnosis not present

## 2018-06-03 DIAGNOSIS — E1122 Type 2 diabetes mellitus with diabetic chronic kidney disease: Secondary | ICD-10-CM | POA: Diagnosis not present

## 2018-06-03 DIAGNOSIS — G8929 Other chronic pain: Secondary | ICD-10-CM | POA: Diagnosis not present

## 2018-06-03 DIAGNOSIS — N184 Chronic kidney disease, stage 4 (severe): Secondary | ICD-10-CM | POA: Diagnosis not present

## 2018-06-03 DIAGNOSIS — M549 Dorsalgia, unspecified: Secondary | ICD-10-CM | POA: Diagnosis not present

## 2018-06-03 DIAGNOSIS — J439 Emphysema, unspecified: Secondary | ICD-10-CM | POA: Diagnosis not present

## 2018-06-03 DIAGNOSIS — L89310 Pressure ulcer of right buttock, unstageable: Secondary | ICD-10-CM | POA: Diagnosis not present

## 2018-06-03 DIAGNOSIS — M1990 Unspecified osteoarthritis, unspecified site: Secondary | ICD-10-CM | POA: Diagnosis not present

## 2018-06-03 DIAGNOSIS — I5032 Chronic diastolic (congestive) heart failure: Secondary | ICD-10-CM | POA: Diagnosis not present

## 2018-06-03 DIAGNOSIS — N319 Neuromuscular dysfunction of bladder, unspecified: Secondary | ICD-10-CM | POA: Diagnosis not present

## 2018-06-03 DIAGNOSIS — I130 Hypertensive heart and chronic kidney disease with heart failure and stage 1 through stage 4 chronic kidney disease, or unspecified chronic kidney disease: Secondary | ICD-10-CM | POA: Diagnosis not present

## 2018-06-03 DIAGNOSIS — I69351 Hemiplegia and hemiparesis following cerebral infarction affecting right dominant side: Secondary | ICD-10-CM | POA: Diagnosis not present

## 2018-06-03 DIAGNOSIS — D631 Anemia in chronic kidney disease: Secondary | ICD-10-CM | POA: Diagnosis not present

## 2018-06-03 NOTE — Telephone Encounter (Signed)
[name] ask for VO 3x week for 4 weeks for increased wound care, pt wound on 4/27 was 1.2x1.3x0.8, no tunneling, sm <0.8 undermining from 12 to 6 todays visit brings wound to 2x2x2 with tunneling 2.8 at 2o'clock and undermining of 3.6 from 12 to 6. [name] states this is the worse change of a wound she has seen.  Would like to do santyl and wet to dry.  Will need script sent to [facility] for santyl VO given, do you agree

## 2018-06-06 MED ORDER — COLLAGENASE 250 UNIT/GM EX OINT: 1.0000 "application " | TOPICAL_OINTMENT | Freq: Every day | CUTANEOUS | 3 refills | Status: DC

## 2018-06-07 DIAGNOSIS — J439 Emphysema, unspecified: Secondary | ICD-10-CM | POA: Diagnosis not present

## 2018-06-07 DIAGNOSIS — D631 Anemia in chronic kidney disease: Secondary | ICD-10-CM | POA: Diagnosis not present

## 2018-06-07 DIAGNOSIS — Z466 Encounter for fitting and adjustment of urinary device: Secondary | ICD-10-CM | POA: Diagnosis not present

## 2018-06-07 DIAGNOSIS — I130 Hypertensive heart and chronic kidney disease with heart failure and stage 1 through stage 4 chronic kidney disease, or unspecified chronic kidney disease: Secondary | ICD-10-CM | POA: Diagnosis not present

## 2018-06-07 DIAGNOSIS — M1990 Unspecified osteoarthritis, unspecified site: Secondary | ICD-10-CM | POA: Diagnosis not present

## 2018-06-07 DIAGNOSIS — N184 Chronic kidney disease, stage 4 (severe): Secondary | ICD-10-CM | POA: Diagnosis not present

## 2018-06-07 DIAGNOSIS — E1122 Type 2 diabetes mellitus with diabetic chronic kidney disease: Secondary | ICD-10-CM | POA: Diagnosis not present

## 2018-06-07 DIAGNOSIS — M549 Dorsalgia, unspecified: Secondary | ICD-10-CM | POA: Diagnosis not present

## 2018-06-07 DIAGNOSIS — I5032 Chronic diastolic (congestive) heart failure: Secondary | ICD-10-CM | POA: Diagnosis not present

## 2018-06-07 DIAGNOSIS — L89310 Pressure ulcer of right buttock, unstageable: Secondary | ICD-10-CM | POA: Diagnosis not present

## 2018-06-07 DIAGNOSIS — N319 Neuromuscular dysfunction of bladder, unspecified: Secondary | ICD-10-CM | POA: Diagnosis not present

## 2018-06-07 DIAGNOSIS — G8929 Other chronic pain: Secondary | ICD-10-CM | POA: Diagnosis not present

## 2018-06-07 DIAGNOSIS — I69351 Hemiplegia and hemiparesis following cerebral infarction affecting right dominant side: Secondary | ICD-10-CM | POA: Diagnosis not present

## 2018-06-08 ENCOUNTER — Telehealth: Payer: Self-pay

## 2018-06-08 NOTE — Telephone Encounter (Signed)
[place] with [facility] specialty pharmacy requesting to speak with a nurse about collagenase (SANTYL) ointment. Please call back.

## 2018-06-08 NOTE — Telephone Encounter (Signed)
Requesting size of wound in order how much Santyl ointment to filled; informed size 2cm x2cm x2cm per telephone note on 5/8 Bonnita Nasuti stated measurement charted is in cm).

## 2018-06-09 DIAGNOSIS — I69351 Hemiplegia and hemiparesis following cerebral infarction affecting right dominant side: Secondary | ICD-10-CM | POA: Diagnosis not present

## 2018-06-09 DIAGNOSIS — N184 Chronic kidney disease, stage 4 (severe): Secondary | ICD-10-CM | POA: Diagnosis not present

## 2018-06-09 DIAGNOSIS — M1990 Unspecified osteoarthritis, unspecified site: Secondary | ICD-10-CM | POA: Diagnosis not present

## 2018-06-09 DIAGNOSIS — D631 Anemia in chronic kidney disease: Secondary | ICD-10-CM | POA: Diagnosis not present

## 2018-06-09 DIAGNOSIS — E1122 Type 2 diabetes mellitus with diabetic chronic kidney disease: Secondary | ICD-10-CM | POA: Diagnosis not present

## 2018-06-09 DIAGNOSIS — Z466 Encounter for fitting and adjustment of urinary device: Secondary | ICD-10-CM | POA: Diagnosis not present

## 2018-06-09 DIAGNOSIS — L89310 Pressure ulcer of right buttock, unstageable: Secondary | ICD-10-CM | POA: Diagnosis not present

## 2018-06-09 DIAGNOSIS — G8929 Other chronic pain: Secondary | ICD-10-CM | POA: Diagnosis not present

## 2018-06-09 DIAGNOSIS — M549 Dorsalgia, unspecified: Secondary | ICD-10-CM | POA: Diagnosis not present

## 2018-06-09 DIAGNOSIS — N319 Neuromuscular dysfunction of bladder, unspecified: Secondary | ICD-10-CM | POA: Diagnosis not present

## 2018-06-09 DIAGNOSIS — J439 Emphysema, unspecified: Secondary | ICD-10-CM | POA: Diagnosis not present

## 2018-06-09 DIAGNOSIS — I130 Hypertensive heart and chronic kidney disease with heart failure and stage 1 through stage 4 chronic kidney disease, or unspecified chronic kidney disease: Secondary | ICD-10-CM | POA: Diagnosis not present

## 2018-06-09 DIAGNOSIS — I5032 Chronic diastolic (congestive) heart failure: Secondary | ICD-10-CM | POA: Diagnosis not present

## 2018-06-10 DIAGNOSIS — I130 Hypertensive heart and chronic kidney disease with heart failure and stage 1 through stage 4 chronic kidney disease, or unspecified chronic kidney disease: Secondary | ICD-10-CM | POA: Diagnosis not present

## 2018-06-10 DIAGNOSIS — L89310 Pressure ulcer of right buttock, unstageable: Secondary | ICD-10-CM | POA: Diagnosis not present

## 2018-06-10 DIAGNOSIS — M1990 Unspecified osteoarthritis, unspecified site: Secondary | ICD-10-CM | POA: Diagnosis not present

## 2018-06-10 DIAGNOSIS — I69351 Hemiplegia and hemiparesis following cerebral infarction affecting right dominant side: Secondary | ICD-10-CM | POA: Diagnosis not present

## 2018-06-10 DIAGNOSIS — E1122 Type 2 diabetes mellitus with diabetic chronic kidney disease: Secondary | ICD-10-CM | POA: Diagnosis not present

## 2018-06-10 DIAGNOSIS — I5032 Chronic diastolic (congestive) heart failure: Secondary | ICD-10-CM | POA: Diagnosis not present

## 2018-06-10 DIAGNOSIS — D631 Anemia in chronic kidney disease: Secondary | ICD-10-CM | POA: Diagnosis not present

## 2018-06-10 DIAGNOSIS — J439 Emphysema, unspecified: Secondary | ICD-10-CM | POA: Diagnosis not present

## 2018-06-10 DIAGNOSIS — G8929 Other chronic pain: Secondary | ICD-10-CM | POA: Diagnosis not present

## 2018-06-10 DIAGNOSIS — Z466 Encounter for fitting and adjustment of urinary device: Secondary | ICD-10-CM | POA: Diagnosis not present

## 2018-06-10 DIAGNOSIS — M549 Dorsalgia, unspecified: Secondary | ICD-10-CM | POA: Diagnosis not present

## 2018-06-10 DIAGNOSIS — N319 Neuromuscular dysfunction of bladder, unspecified: Secondary | ICD-10-CM | POA: Diagnosis not present

## 2018-06-10 DIAGNOSIS — N184 Chronic kidney disease, stage 4 (severe): Secondary | ICD-10-CM | POA: Diagnosis not present

## 2018-06-14 DIAGNOSIS — N319 Neuromuscular dysfunction of bladder, unspecified: Secondary | ICD-10-CM | POA: Diagnosis not present

## 2018-06-14 DIAGNOSIS — J439 Emphysema, unspecified: Secondary | ICD-10-CM | POA: Diagnosis not present

## 2018-06-14 DIAGNOSIS — I130 Hypertensive heart and chronic kidney disease with heart failure and stage 1 through stage 4 chronic kidney disease, or unspecified chronic kidney disease: Secondary | ICD-10-CM | POA: Diagnosis not present

## 2018-06-14 DIAGNOSIS — I5032 Chronic diastolic (congestive) heart failure: Secondary | ICD-10-CM | POA: Diagnosis not present

## 2018-06-14 DIAGNOSIS — Z466 Encounter for fitting and adjustment of urinary device: Secondary | ICD-10-CM | POA: Diagnosis not present

## 2018-06-14 DIAGNOSIS — L89310 Pressure ulcer of right buttock, unstageable: Secondary | ICD-10-CM | POA: Diagnosis not present

## 2018-06-14 DIAGNOSIS — G8929 Other chronic pain: Secondary | ICD-10-CM | POA: Diagnosis not present

## 2018-06-14 DIAGNOSIS — N184 Chronic kidney disease, stage 4 (severe): Secondary | ICD-10-CM | POA: Diagnosis not present

## 2018-06-14 DIAGNOSIS — M1990 Unspecified osteoarthritis, unspecified site: Secondary | ICD-10-CM | POA: Diagnosis not present

## 2018-06-14 DIAGNOSIS — E1122 Type 2 diabetes mellitus with diabetic chronic kidney disease: Secondary | ICD-10-CM | POA: Diagnosis not present

## 2018-06-14 DIAGNOSIS — D631 Anemia in chronic kidney disease: Secondary | ICD-10-CM | POA: Diagnosis not present

## 2018-06-14 DIAGNOSIS — I69351 Hemiplegia and hemiparesis following cerebral infarction affecting right dominant side: Secondary | ICD-10-CM | POA: Diagnosis not present

## 2018-06-14 DIAGNOSIS — M549 Dorsalgia, unspecified: Secondary | ICD-10-CM | POA: Diagnosis not present

## 2018-06-17 ENCOUNTER — Telehealth: Payer: Self-pay | Admitting: *Deleted

## 2018-06-17 ENCOUNTER — Other Ambulatory Visit: Payer: Self-pay | Admitting: Internal Medicine

## 2018-06-17 DIAGNOSIS — Z466 Encounter for fitting and adjustment of urinary device: Secondary | ICD-10-CM | POA: Diagnosis not present

## 2018-06-17 DIAGNOSIS — N184 Chronic kidney disease, stage 4 (severe): Secondary | ICD-10-CM | POA: Diagnosis not present

## 2018-06-17 DIAGNOSIS — L89310 Pressure ulcer of right buttock, unstageable: Secondary | ICD-10-CM | POA: Diagnosis not present

## 2018-06-17 DIAGNOSIS — N319 Neuromuscular dysfunction of bladder, unspecified: Secondary | ICD-10-CM | POA: Diagnosis not present

## 2018-06-17 DIAGNOSIS — G8191 Hemiplegia, unspecified affecting right dominant side: Secondary | ICD-10-CM

## 2018-06-17 DIAGNOSIS — I69351 Hemiplegia and hemiparesis following cerebral infarction affecting right dominant side: Secondary | ICD-10-CM | POA: Diagnosis not present

## 2018-06-17 DIAGNOSIS — E1122 Type 2 diabetes mellitus with diabetic chronic kidney disease: Secondary | ICD-10-CM | POA: Diagnosis not present

## 2018-06-17 DIAGNOSIS — G8929 Other chronic pain: Secondary | ICD-10-CM | POA: Diagnosis not present

## 2018-06-17 DIAGNOSIS — D631 Anemia in chronic kidney disease: Secondary | ICD-10-CM | POA: Diagnosis not present

## 2018-06-17 DIAGNOSIS — M1990 Unspecified osteoarthritis, unspecified site: Secondary | ICD-10-CM | POA: Diagnosis not present

## 2018-06-17 DIAGNOSIS — M549 Dorsalgia, unspecified: Secondary | ICD-10-CM | POA: Diagnosis not present

## 2018-06-17 DIAGNOSIS — J439 Emphysema, unspecified: Secondary | ICD-10-CM | POA: Diagnosis not present

## 2018-06-17 DIAGNOSIS — I5032 Chronic diastolic (congestive) heart failure: Secondary | ICD-10-CM | POA: Diagnosis not present

## 2018-06-17 DIAGNOSIS — I130 Hypertensive heart and chronic kidney disease with heart failure and stage 1 through stage 4 chronic kidney disease, or unspecified chronic kidney disease: Secondary | ICD-10-CM | POA: Diagnosis not present

## 2018-06-17 NOTE — Telephone Encounter (Signed)
Talked to pt's daughter, [name] - informed of referral.

## 2018-06-17 NOTE — Telephone Encounter (Signed)
I did put a referral for wound care.

## 2018-06-17 NOTE — Telephone Encounter (Signed)
[name] [phone] Needs referral to wound care, wound is worse, it is now 2cmx 3cmx 2.1cm deep with tunneling from 12 to 2, easy to move qtip around in tunnel. Daughter is calling wound care center to schedule today if referral can be put in. Has been on air mattress for appr 1 week Pt does not like to move a lot and does not like a lot of high  protein type foods, suggested hi protein shakes but pt refused.

## 2018-06-21 DIAGNOSIS — Z466 Encounter for fitting and adjustment of urinary device: Secondary | ICD-10-CM | POA: Diagnosis not present

## 2018-06-21 DIAGNOSIS — I69351 Hemiplegia and hemiparesis following cerebral infarction affecting right dominant side: Secondary | ICD-10-CM | POA: Diagnosis not present

## 2018-06-21 DIAGNOSIS — D631 Anemia in chronic kidney disease: Secondary | ICD-10-CM | POA: Diagnosis not present

## 2018-06-21 DIAGNOSIS — I130 Hypertensive heart and chronic kidney disease with heart failure and stage 1 through stage 4 chronic kidney disease, or unspecified chronic kidney disease: Secondary | ICD-10-CM | POA: Diagnosis not present

## 2018-06-21 DIAGNOSIS — J439 Emphysema, unspecified: Secondary | ICD-10-CM | POA: Diagnosis not present

## 2018-06-21 DIAGNOSIS — G8929 Other chronic pain: Secondary | ICD-10-CM | POA: Diagnosis not present

## 2018-06-21 DIAGNOSIS — E1122 Type 2 diabetes mellitus with diabetic chronic kidney disease: Secondary | ICD-10-CM | POA: Diagnosis not present

## 2018-06-21 DIAGNOSIS — M549 Dorsalgia, unspecified: Secondary | ICD-10-CM | POA: Diagnosis not present

## 2018-06-21 DIAGNOSIS — N184 Chronic kidney disease, stage 4 (severe): Secondary | ICD-10-CM | POA: Diagnosis not present

## 2018-06-21 DIAGNOSIS — I5032 Chronic diastolic (congestive) heart failure: Secondary | ICD-10-CM | POA: Diagnosis not present

## 2018-06-21 DIAGNOSIS — L89310 Pressure ulcer of right buttock, unstageable: Secondary | ICD-10-CM | POA: Diagnosis not present

## 2018-06-21 DIAGNOSIS — N319 Neuromuscular dysfunction of bladder, unspecified: Secondary | ICD-10-CM | POA: Diagnosis not present

## 2018-06-21 DIAGNOSIS — M1990 Unspecified osteoarthritis, unspecified site: Secondary | ICD-10-CM | POA: Diagnosis not present

## 2018-06-22 DIAGNOSIS — J449 Chronic obstructive pulmonary disease, unspecified: Secondary | ICD-10-CM | POA: Diagnosis not present

## 2018-06-23 DIAGNOSIS — E1122 Type 2 diabetes mellitus with diabetic chronic kidney disease: Secondary | ICD-10-CM | POA: Diagnosis not present

## 2018-06-23 DIAGNOSIS — N319 Neuromuscular dysfunction of bladder, unspecified: Secondary | ICD-10-CM | POA: Diagnosis not present

## 2018-06-23 DIAGNOSIS — J439 Emphysema, unspecified: Secondary | ICD-10-CM | POA: Diagnosis not present

## 2018-06-23 DIAGNOSIS — I69351 Hemiplegia and hemiparesis following cerebral infarction affecting right dominant side: Secondary | ICD-10-CM | POA: Diagnosis not present

## 2018-06-23 DIAGNOSIS — I130 Hypertensive heart and chronic kidney disease with heart failure and stage 1 through stage 4 chronic kidney disease, or unspecified chronic kidney disease: Secondary | ICD-10-CM | POA: Diagnosis not present

## 2018-06-23 DIAGNOSIS — Z466 Encounter for fitting and adjustment of urinary device: Secondary | ICD-10-CM | POA: Diagnosis not present

## 2018-06-23 DIAGNOSIS — D631 Anemia in chronic kidney disease: Secondary | ICD-10-CM | POA: Diagnosis not present

## 2018-06-23 DIAGNOSIS — N184 Chronic kidney disease, stage 4 (severe): Secondary | ICD-10-CM | POA: Diagnosis not present

## 2018-06-23 DIAGNOSIS — M1990 Unspecified osteoarthritis, unspecified site: Secondary | ICD-10-CM | POA: Diagnosis not present

## 2018-06-23 DIAGNOSIS — I5032 Chronic diastolic (congestive) heart failure: Secondary | ICD-10-CM | POA: Diagnosis not present

## 2018-06-23 DIAGNOSIS — M549 Dorsalgia, unspecified: Secondary | ICD-10-CM | POA: Diagnosis not present

## 2018-06-23 DIAGNOSIS — G8929 Other chronic pain: Secondary | ICD-10-CM | POA: Diagnosis not present

## 2018-06-23 DIAGNOSIS — L89310 Pressure ulcer of right buttock, unstageable: Secondary | ICD-10-CM | POA: Diagnosis not present

## 2018-06-24 DIAGNOSIS — M549 Dorsalgia, unspecified: Secondary | ICD-10-CM | POA: Diagnosis not present

## 2018-06-24 DIAGNOSIS — I69351 Hemiplegia and hemiparesis following cerebral infarction affecting right dominant side: Secondary | ICD-10-CM | POA: Diagnosis not present

## 2018-06-24 DIAGNOSIS — L89310 Pressure ulcer of right buttock, unstageable: Secondary | ICD-10-CM | POA: Diagnosis not present

## 2018-06-24 DIAGNOSIS — Z466 Encounter for fitting and adjustment of urinary device: Secondary | ICD-10-CM | POA: Diagnosis not present

## 2018-06-24 DIAGNOSIS — I5032 Chronic diastolic (congestive) heart failure: Secondary | ICD-10-CM | POA: Diagnosis not present

## 2018-06-24 DIAGNOSIS — N319 Neuromuscular dysfunction of bladder, unspecified: Secondary | ICD-10-CM | POA: Diagnosis not present

## 2018-06-24 DIAGNOSIS — M1990 Unspecified osteoarthritis, unspecified site: Secondary | ICD-10-CM | POA: Diagnosis not present

## 2018-06-24 DIAGNOSIS — I130 Hypertensive heart and chronic kidney disease with heart failure and stage 1 through stage 4 chronic kidney disease, or unspecified chronic kidney disease: Secondary | ICD-10-CM | POA: Diagnosis not present

## 2018-06-24 DIAGNOSIS — D631 Anemia in chronic kidney disease: Secondary | ICD-10-CM | POA: Diagnosis not present

## 2018-06-24 DIAGNOSIS — N184 Chronic kidney disease, stage 4 (severe): Secondary | ICD-10-CM | POA: Diagnosis not present

## 2018-06-24 DIAGNOSIS — J439 Emphysema, unspecified: Secondary | ICD-10-CM | POA: Diagnosis not present

## 2018-06-24 DIAGNOSIS — E1122 Type 2 diabetes mellitus with diabetic chronic kidney disease: Secondary | ICD-10-CM | POA: Diagnosis not present

## 2018-06-24 DIAGNOSIS — G8929 Other chronic pain: Secondary | ICD-10-CM | POA: Diagnosis not present

## 2018-06-28 DIAGNOSIS — M419 Scoliosis, unspecified: Secondary | ICD-10-CM | POA: Diagnosis not present

## 2018-06-28 DIAGNOSIS — E1122 Type 2 diabetes mellitus with diabetic chronic kidney disease: Secondary | ICD-10-CM | POA: Diagnosis not present

## 2018-06-28 DIAGNOSIS — M549 Dorsalgia, unspecified: Secondary | ICD-10-CM | POA: Diagnosis not present

## 2018-06-28 DIAGNOSIS — I130 Hypertensive heart and chronic kidney disease with heart failure and stage 1 through stage 4 chronic kidney disease, or unspecified chronic kidney disease: Secondary | ICD-10-CM | POA: Diagnosis not present

## 2018-06-28 DIAGNOSIS — L89310 Pressure ulcer of right buttock, unstageable: Secondary | ICD-10-CM | POA: Diagnosis not present

## 2018-06-28 DIAGNOSIS — Z466 Encounter for fitting and adjustment of urinary device: Secondary | ICD-10-CM | POA: Diagnosis not present

## 2018-06-28 DIAGNOSIS — I5032 Chronic diastolic (congestive) heart failure: Secondary | ICD-10-CM | POA: Diagnosis not present

## 2018-06-28 DIAGNOSIS — J439 Emphysema, unspecified: Secondary | ICD-10-CM | POA: Diagnosis not present

## 2018-06-28 DIAGNOSIS — G8929 Other chronic pain: Secondary | ICD-10-CM | POA: Diagnosis not present

## 2018-06-28 DIAGNOSIS — I69351 Hemiplegia and hemiparesis following cerebral infarction affecting right dominant side: Secondary | ICD-10-CM | POA: Diagnosis not present

## 2018-06-28 DIAGNOSIS — N184 Chronic kidney disease, stage 4 (severe): Secondary | ICD-10-CM | POA: Diagnosis not present

## 2018-06-28 DIAGNOSIS — M1990 Unspecified osteoarthritis, unspecified site: Secondary | ICD-10-CM | POA: Diagnosis not present

## 2018-06-28 DIAGNOSIS — D631 Anemia in chronic kidney disease: Secondary | ICD-10-CM | POA: Diagnosis not present

## 2018-06-29 ENCOUNTER — Encounter (HOSPITAL_BASED_OUTPATIENT_CLINIC_OR_DEPARTMENT_OTHER): Payer: Medicare Other | Attending: Physician Assistant

## 2018-06-29 DIAGNOSIS — Z9221 Personal history of antineoplastic chemotherapy: Secondary | ICD-10-CM | POA: Insufficient documentation

## 2018-06-29 DIAGNOSIS — I69351 Hemiplegia and hemiparesis following cerebral infarction affecting right dominant side: Secondary | ICD-10-CM | POA: Insufficient documentation

## 2018-06-29 DIAGNOSIS — J449 Chronic obstructive pulmonary disease, unspecified: Secondary | ICD-10-CM | POA: Insufficient documentation

## 2018-06-29 DIAGNOSIS — Z923 Personal history of irradiation: Secondary | ICD-10-CM | POA: Insufficient documentation

## 2018-06-29 DIAGNOSIS — I110 Hypertensive heart disease with heart failure: Secondary | ICD-10-CM | POA: Insufficient documentation

## 2018-06-29 DIAGNOSIS — G4733 Obstructive sleep apnea (adult) (pediatric): Secondary | ICD-10-CM | POA: Insufficient documentation

## 2018-06-29 DIAGNOSIS — Z86718 Personal history of other venous thrombosis and embolism: Secondary | ICD-10-CM | POA: Insufficient documentation

## 2018-06-29 DIAGNOSIS — I509 Heart failure, unspecified: Secondary | ICD-10-CM | POA: Insufficient documentation

## 2018-06-29 DIAGNOSIS — I252 Old myocardial infarction: Secondary | ICD-10-CM | POA: Insufficient documentation

## 2018-06-29 DIAGNOSIS — Z993 Dependence on wheelchair: Secondary | ICD-10-CM | POA: Insufficient documentation

## 2018-06-29 DIAGNOSIS — E119 Type 2 diabetes mellitus without complications: Secondary | ICD-10-CM | POA: Insufficient documentation

## 2018-06-29 DIAGNOSIS — Z85038 Personal history of other malignant neoplasm of large intestine: Secondary | ICD-10-CM | POA: Insufficient documentation

## 2018-06-29 DIAGNOSIS — L89153 Pressure ulcer of sacral region, stage 3: Secondary | ICD-10-CM | POA: Insufficient documentation

## 2018-06-29 DIAGNOSIS — I2510 Atherosclerotic heart disease of native coronary artery without angina pectoris: Secondary | ICD-10-CM | POA: Insufficient documentation

## 2018-07-01 DIAGNOSIS — M1990 Unspecified osteoarthritis, unspecified site: Secondary | ICD-10-CM | POA: Diagnosis not present

## 2018-07-01 DIAGNOSIS — I5032 Chronic diastolic (congestive) heart failure: Secondary | ICD-10-CM | POA: Diagnosis not present

## 2018-07-01 DIAGNOSIS — D631 Anemia in chronic kidney disease: Secondary | ICD-10-CM | POA: Diagnosis not present

## 2018-07-01 DIAGNOSIS — I69351 Hemiplegia and hemiparesis following cerebral infarction affecting right dominant side: Secondary | ICD-10-CM | POA: Diagnosis not present

## 2018-07-01 DIAGNOSIS — N184 Chronic kidney disease, stage 4 (severe): Secondary | ICD-10-CM | POA: Diagnosis not present

## 2018-07-01 DIAGNOSIS — Z466 Encounter for fitting and adjustment of urinary device: Secondary | ICD-10-CM | POA: Diagnosis not present

## 2018-07-01 DIAGNOSIS — G8929 Other chronic pain: Secondary | ICD-10-CM | POA: Diagnosis not present

## 2018-07-01 DIAGNOSIS — L89310 Pressure ulcer of right buttock, unstageable: Secondary | ICD-10-CM | POA: Diagnosis not present

## 2018-07-01 DIAGNOSIS — I130 Hypertensive heart and chronic kidney disease with heart failure and stage 1 through stage 4 chronic kidney disease, or unspecified chronic kidney disease: Secondary | ICD-10-CM | POA: Diagnosis not present

## 2018-07-01 DIAGNOSIS — M549 Dorsalgia, unspecified: Secondary | ICD-10-CM | POA: Diagnosis not present

## 2018-07-01 DIAGNOSIS — M419 Scoliosis, unspecified: Secondary | ICD-10-CM | POA: Diagnosis not present

## 2018-07-01 DIAGNOSIS — J439 Emphysema, unspecified: Secondary | ICD-10-CM | POA: Diagnosis not present

## 2018-07-01 DIAGNOSIS — E1122 Type 2 diabetes mellitus with diabetic chronic kidney disease: Secondary | ICD-10-CM | POA: Diagnosis not present

## 2018-07-05 DIAGNOSIS — I130 Hypertensive heart and chronic kidney disease with heart failure and stage 1 through stage 4 chronic kidney disease, or unspecified chronic kidney disease: Secondary | ICD-10-CM | POA: Diagnosis not present

## 2018-07-05 DIAGNOSIS — J439 Emphysema, unspecified: Secondary | ICD-10-CM | POA: Diagnosis not present

## 2018-07-05 DIAGNOSIS — D631 Anemia in chronic kidney disease: Secondary | ICD-10-CM | POA: Diagnosis not present

## 2018-07-05 DIAGNOSIS — M1990 Unspecified osteoarthritis, unspecified site: Secondary | ICD-10-CM | POA: Diagnosis not present

## 2018-07-05 DIAGNOSIS — L89310 Pressure ulcer of right buttock, unstageable: Secondary | ICD-10-CM | POA: Diagnosis not present

## 2018-07-05 DIAGNOSIS — I69351 Hemiplegia and hemiparesis following cerebral infarction affecting right dominant side: Secondary | ICD-10-CM | POA: Diagnosis not present

## 2018-07-05 DIAGNOSIS — N184 Chronic kidney disease, stage 4 (severe): Secondary | ICD-10-CM | POA: Diagnosis not present

## 2018-07-05 DIAGNOSIS — M419 Scoliosis, unspecified: Secondary | ICD-10-CM | POA: Diagnosis not present

## 2018-07-05 DIAGNOSIS — E1122 Type 2 diabetes mellitus with diabetic chronic kidney disease: Secondary | ICD-10-CM | POA: Diagnosis not present

## 2018-07-05 DIAGNOSIS — I5032 Chronic diastolic (congestive) heart failure: Secondary | ICD-10-CM | POA: Diagnosis not present

## 2018-07-05 DIAGNOSIS — G8929 Other chronic pain: Secondary | ICD-10-CM | POA: Diagnosis not present

## 2018-07-05 DIAGNOSIS — Z466 Encounter for fitting and adjustment of urinary device: Secondary | ICD-10-CM | POA: Diagnosis not present

## 2018-07-05 DIAGNOSIS — M549 Dorsalgia, unspecified: Secondary | ICD-10-CM | POA: Diagnosis not present

## 2018-07-07 ENCOUNTER — Other Ambulatory Visit: Payer: Self-pay | Admitting: Internal Medicine

## 2018-07-07 DIAGNOSIS — G4733 Obstructive sleep apnea (adult) (pediatric): Secondary | ICD-10-CM | POA: Diagnosis not present

## 2018-07-07 DIAGNOSIS — Z923 Personal history of irradiation: Secondary | ICD-10-CM | POA: Diagnosis not present

## 2018-07-07 DIAGNOSIS — I2510 Atherosclerotic heart disease of native coronary artery without angina pectoris: Secondary | ICD-10-CM | POA: Diagnosis not present

## 2018-07-07 DIAGNOSIS — E119 Type 2 diabetes mellitus without complications: Secondary | ICD-10-CM | POA: Diagnosis not present

## 2018-07-07 DIAGNOSIS — I509 Heart failure, unspecified: Secondary | ICD-10-CM | POA: Diagnosis not present

## 2018-07-07 DIAGNOSIS — Z9221 Personal history of antineoplastic chemotherapy: Secondary | ICD-10-CM | POA: Diagnosis not present

## 2018-07-07 DIAGNOSIS — L89153 Pressure ulcer of sacral region, stage 3: Secondary | ICD-10-CM | POA: Diagnosis not present

## 2018-07-07 DIAGNOSIS — I252 Old myocardial infarction: Secondary | ICD-10-CM | POA: Diagnosis not present

## 2018-07-07 DIAGNOSIS — Z85038 Personal history of other malignant neoplasm of large intestine: Secondary | ICD-10-CM | POA: Diagnosis not present

## 2018-07-07 DIAGNOSIS — Z993 Dependence on wheelchair: Secondary | ICD-10-CM | POA: Diagnosis not present

## 2018-07-07 DIAGNOSIS — I69351 Hemiplegia and hemiparesis following cerebral infarction affecting right dominant side: Secondary | ICD-10-CM | POA: Diagnosis not present

## 2018-07-07 DIAGNOSIS — Z79891 Long term (current) use of opiate analgesic: Secondary | ICD-10-CM

## 2018-07-07 DIAGNOSIS — J449 Chronic obstructive pulmonary disease, unspecified: Secondary | ICD-10-CM | POA: Diagnosis not present

## 2018-07-07 DIAGNOSIS — L89313 Pressure ulcer of right buttock, stage 3: Secondary | ICD-10-CM | POA: Diagnosis not present

## 2018-07-07 DIAGNOSIS — Z86718 Personal history of other venous thrombosis and embolism: Secondary | ICD-10-CM | POA: Diagnosis not present

## 2018-07-07 DIAGNOSIS — I110 Hypertensive heart disease with heart failure: Secondary | ICD-10-CM | POA: Diagnosis not present

## 2018-07-07 NOTE — Telephone Encounter (Signed)
Refill Request Oxycodone HCl 10 MG TABS  [id number] - [place], [place] - [address] AT [place] Pt's Caregiver Shealyn Sean is also asking a Prescription for Ensure.

## 2018-07-08 ENCOUNTER — Other Ambulatory Visit: Payer: Self-pay | Admitting: Internal Medicine

## 2018-07-08 ENCOUNTER — Other Ambulatory Visit (HOSPITAL_COMMUNITY)
Admission: RE | Admit: 2018-07-08 | Discharge: 2018-07-08 | Disposition: A | Discharge status: Home / Self Care | Payer: Medicare Other | Source: Ambulatory Visit | Attending: Internal Medicine | Admitting: Internal Medicine

## 2018-07-08 DIAGNOSIS — Z79891 Long term (current) use of opiate analgesic: Secondary | ICD-10-CM

## 2018-07-08 DIAGNOSIS — L89313 Pressure ulcer of right buttock, stage 3: Secondary | ICD-10-CM | POA: Insufficient documentation

## 2018-07-08 DIAGNOSIS — G8929 Other chronic pain: Secondary | ICD-10-CM | POA: Diagnosis not present

## 2018-07-08 DIAGNOSIS — I5032 Chronic diastolic (congestive) heart failure: Secondary | ICD-10-CM | POA: Diagnosis not present

## 2018-07-08 DIAGNOSIS — M1990 Unspecified osteoarthritis, unspecified site: Secondary | ICD-10-CM | POA: Diagnosis not present

## 2018-07-08 DIAGNOSIS — J439 Emphysema, unspecified: Secondary | ICD-10-CM | POA: Diagnosis not present

## 2018-07-08 DIAGNOSIS — I69351 Hemiplegia and hemiparesis following cerebral infarction affecting right dominant side: Secondary | ICD-10-CM | POA: Diagnosis not present

## 2018-07-08 DIAGNOSIS — L89310 Pressure ulcer of right buttock, unstageable: Secondary | ICD-10-CM | POA: Diagnosis not present

## 2018-07-08 DIAGNOSIS — M419 Scoliosis, unspecified: Secondary | ICD-10-CM | POA: Diagnosis not present

## 2018-07-08 DIAGNOSIS — M549 Dorsalgia, unspecified: Secondary | ICD-10-CM | POA: Diagnosis not present

## 2018-07-08 DIAGNOSIS — N184 Chronic kidney disease, stage 4 (severe): Secondary | ICD-10-CM | POA: Diagnosis not present

## 2018-07-08 DIAGNOSIS — D631 Anemia in chronic kidney disease: Secondary | ICD-10-CM | POA: Diagnosis not present

## 2018-07-08 DIAGNOSIS — E1122 Type 2 diabetes mellitus with diabetic chronic kidney disease: Secondary | ICD-10-CM | POA: Diagnosis not present

## 2018-07-08 DIAGNOSIS — Z466 Encounter for fitting and adjustment of urinary device: Secondary | ICD-10-CM | POA: Diagnosis not present

## 2018-07-08 DIAGNOSIS — I130 Hypertensive heart and chronic kidney disease with heart failure and stage 1 through stage 4 chronic kidney disease, or unspecified chronic kidney disease: Secondary | ICD-10-CM | POA: Diagnosis not present

## 2018-07-08 MED ORDER — OXYCODONE HCL 10 MG PO TABS: 10.0000 mg | ORAL_TABLET | Freq: Two times a day (BID) | ORAL | 0 refills | Status: DC | PRN

## 2018-07-08 MED ORDER — ENSURE COMPLETE PO LIQD: 237.0000 mL | Freq: Two times a day (BID) | ORAL | 10 refills | Status: DC

## 2018-07-08 NOTE — Telephone Encounter (Signed)
Ordered ensure, unable to do oxycodone due to imprivata issues.

## 2018-07-08 NOTE — Telephone Encounter (Signed)
No problem.    PDMP reviewed and appeared appropriate.   Refill X 1 month.   It was confusing about # of tabs.  Based on Rx Sig, she should only receive 60 tabs/month which is what I sent in.   Thanks!

## 2018-07-08 NOTE — Telephone Encounter (Signed)
Call placed to daughter, [name]. States they saw wound doctor yesterday. He states it is a stage 3 wound and wants it to be x-rayed. He has coordinated [place] RN to pack wound. He states patient will require additional pain med due to discomfort during packing. Insurance would not allow them to receive 90 day supply of pain med last month so patient only received 30 day supply. Patient has 10 pills left. Requesting refill on oxycodone. Hubbard Hartshorn, RN, BSN

## 2018-07-10 LAB — AEROBIC CULTURE W GRAM STAIN (SUPERFICIAL SPECIMEN)

## 2018-07-10 LAB — AEROBIC CULTURE? (SUPERFICIAL SPECIMEN)

## 2018-07-12 DIAGNOSIS — Z466 Encounter for fitting and adjustment of urinary device: Secondary | ICD-10-CM | POA: Diagnosis not present

## 2018-07-12 DIAGNOSIS — E1122 Type 2 diabetes mellitus with diabetic chronic kidney disease: Secondary | ICD-10-CM | POA: Diagnosis not present

## 2018-07-12 DIAGNOSIS — I130 Hypertensive heart and chronic kidney disease with heart failure and stage 1 through stage 4 chronic kidney disease, or unspecified chronic kidney disease: Secondary | ICD-10-CM | POA: Diagnosis not present

## 2018-07-12 DIAGNOSIS — N184 Chronic kidney disease, stage 4 (severe): Secondary | ICD-10-CM | POA: Diagnosis not present

## 2018-07-12 DIAGNOSIS — M1990 Unspecified osteoarthritis, unspecified site: Secondary | ICD-10-CM | POA: Diagnosis not present

## 2018-07-12 DIAGNOSIS — M549 Dorsalgia, unspecified: Secondary | ICD-10-CM | POA: Diagnosis not present

## 2018-07-12 DIAGNOSIS — M419 Scoliosis, unspecified: Secondary | ICD-10-CM | POA: Diagnosis not present

## 2018-07-12 DIAGNOSIS — D631 Anemia in chronic kidney disease: Secondary | ICD-10-CM | POA: Diagnosis not present

## 2018-07-12 DIAGNOSIS — I5032 Chronic diastolic (congestive) heart failure: Secondary | ICD-10-CM | POA: Diagnosis not present

## 2018-07-12 DIAGNOSIS — J439 Emphysema, unspecified: Secondary | ICD-10-CM | POA: Diagnosis not present

## 2018-07-12 DIAGNOSIS — I69351 Hemiplegia and hemiparesis following cerebral infarction affecting right dominant side: Secondary | ICD-10-CM | POA: Diagnosis not present

## 2018-07-12 DIAGNOSIS — G8929 Other chronic pain: Secondary | ICD-10-CM | POA: Diagnosis not present

## 2018-07-12 DIAGNOSIS — L89310 Pressure ulcer of right buttock, unstageable: Secondary | ICD-10-CM | POA: Diagnosis not present

## 2018-07-13 ENCOUNTER — Telehealth: Payer: Self-pay | Admitting: Internal Medicine

## 2018-07-13 NOTE — Telephone Encounter (Signed)
SW from the [facility] Department F/U with an order for Ensure, [employer], and Diapers.  Per SW Lucretia Field, This  request was faxed on 06/27/2018 and has not been rec'd and would like a call back.

## 2018-07-13 NOTE — Telephone Encounter (Signed)
Rx for Ensure was sent to [facility] on 07/07/2018 per caregiver's [place]) request. Have not received fax for incontinence supplies. Returned call to [name]. No answer. Left message on VM requesting return call to discuss these requests. FYI, patient has full medicaid and would qualify for free incontinence supplies through [employer]. Would need to determine if patient truly wants diapers or would prefer pull ups. Hubbard Hartshorn, RN, BSN

## 2018-07-14 DIAGNOSIS — M549 Dorsalgia, unspecified: Secondary | ICD-10-CM | POA: Diagnosis not present

## 2018-07-14 DIAGNOSIS — I130 Hypertensive heart and chronic kidney disease with heart failure and stage 1 through stage 4 chronic kidney disease, or unspecified chronic kidney disease: Secondary | ICD-10-CM | POA: Diagnosis not present

## 2018-07-14 DIAGNOSIS — I5032 Chronic diastolic (congestive) heart failure: Secondary | ICD-10-CM | POA: Diagnosis not present

## 2018-07-14 DIAGNOSIS — I69351 Hemiplegia and hemiparesis following cerebral infarction affecting right dominant side: Secondary | ICD-10-CM | POA: Diagnosis not present

## 2018-07-14 DIAGNOSIS — N184 Chronic kidney disease, stage 4 (severe): Secondary | ICD-10-CM | POA: Diagnosis not present

## 2018-07-14 DIAGNOSIS — N319 Neuromuscular dysfunction of bladder, unspecified: Secondary | ICD-10-CM | POA: Diagnosis not present

## 2018-07-14 DIAGNOSIS — M1990 Unspecified osteoarthritis, unspecified site: Secondary | ICD-10-CM | POA: Diagnosis not present

## 2018-07-14 DIAGNOSIS — Z466 Encounter for fitting and adjustment of urinary device: Secondary | ICD-10-CM | POA: Diagnosis not present

## 2018-07-14 DIAGNOSIS — E1122 Type 2 diabetes mellitus with diabetic chronic kidney disease: Secondary | ICD-10-CM | POA: Diagnosis not present

## 2018-07-14 DIAGNOSIS — L89310 Pressure ulcer of right buttock, unstageable: Secondary | ICD-10-CM | POA: Diagnosis not present

## 2018-07-14 DIAGNOSIS — J439 Emphysema, unspecified: Secondary | ICD-10-CM | POA: Diagnosis not present

## 2018-07-14 DIAGNOSIS — M419 Scoliosis, unspecified: Secondary | ICD-10-CM | POA: Diagnosis not present

## 2018-07-14 DIAGNOSIS — D631 Anemia in chronic kidney disease: Secondary | ICD-10-CM | POA: Diagnosis not present

## 2018-07-14 DIAGNOSIS — G8929 Other chronic pain: Secondary | ICD-10-CM | POA: Diagnosis not present

## 2018-07-18 DIAGNOSIS — Z466 Encounter for fitting and adjustment of urinary device: Secondary | ICD-10-CM | POA: Diagnosis not present

## 2018-07-18 DIAGNOSIS — I130 Hypertensive heart and chronic kidney disease with heart failure and stage 1 through stage 4 chronic kidney disease, or unspecified chronic kidney disease: Secondary | ICD-10-CM | POA: Diagnosis not present

## 2018-07-18 DIAGNOSIS — I69351 Hemiplegia and hemiparesis following cerebral infarction affecting right dominant side: Secondary | ICD-10-CM | POA: Diagnosis not present

## 2018-07-18 DIAGNOSIS — M1990 Unspecified osteoarthritis, unspecified site: Secondary | ICD-10-CM | POA: Diagnosis not present

## 2018-07-18 DIAGNOSIS — M549 Dorsalgia, unspecified: Secondary | ICD-10-CM | POA: Diagnosis not present

## 2018-07-18 DIAGNOSIS — I5032 Chronic diastolic (congestive) heart failure: Secondary | ICD-10-CM | POA: Diagnosis not present

## 2018-07-18 DIAGNOSIS — L89310 Pressure ulcer of right buttock, unstageable: Secondary | ICD-10-CM | POA: Diagnosis not present

## 2018-07-18 DIAGNOSIS — D631 Anemia in chronic kidney disease: Secondary | ICD-10-CM | POA: Diagnosis not present

## 2018-07-18 DIAGNOSIS — G8929 Other chronic pain: Secondary | ICD-10-CM | POA: Diagnosis not present

## 2018-07-18 DIAGNOSIS — J439 Emphysema, unspecified: Secondary | ICD-10-CM | POA: Diagnosis not present

## 2018-07-18 DIAGNOSIS — N184 Chronic kidney disease, stage 4 (severe): Secondary | ICD-10-CM | POA: Diagnosis not present

## 2018-07-18 DIAGNOSIS — E1122 Type 2 diabetes mellitus with diabetic chronic kidney disease: Secondary | ICD-10-CM | POA: Diagnosis not present

## 2018-07-18 DIAGNOSIS — M419 Scoliosis, unspecified: Secondary | ICD-10-CM | POA: Diagnosis not present

## 2018-07-18 NOTE — Telephone Encounter (Signed)
Confirmed with [facility] they have Rx for Ensure sent 07/08/2018 but insurance will not cover it as it is OTC. Call placed to daughter. [name] states order for Ensure and incontinence supplies need to go to the CAP Librarian, academic for Disabled Adults). Spoke with [employer] [facility], SWII. She faxed form needing PCP signature on 07/15/2018. This program will provide Ensure and incontinence supplies for free. Form given to PCP today. Will need to fax to [place] at [phone]. Hubbard Hartshorn, RN, BSN

## 2018-07-18 NOTE — Telephone Encounter (Signed)
Signed form for Ensure and incontinence supplies faxed to [place] for CAP at [phone]. Hubbard Hartshorn, RN, BSN

## 2018-07-18 NOTE — Telephone Encounter (Signed)
Rec'd call from [facility]. Ensure Prescription was not at the pharmacy only the pt's Pain Meds and Antibiotics were ready. Care Giver also reports patient needs 2XL Diapers instead of Pulls ups, Large Gloves Wipes and Chucks if possible.  She can be reached @ [phone] which is her cell phone number if questions.

## 2018-07-20 DIAGNOSIS — L89310 Pressure ulcer of right buttock, unstageable: Secondary | ICD-10-CM | POA: Diagnosis not present

## 2018-07-20 DIAGNOSIS — M549 Dorsalgia, unspecified: Secondary | ICD-10-CM | POA: Diagnosis not present

## 2018-07-20 DIAGNOSIS — N184 Chronic kidney disease, stage 4 (severe): Secondary | ICD-10-CM | POA: Diagnosis not present

## 2018-07-20 DIAGNOSIS — E1122 Type 2 diabetes mellitus with diabetic chronic kidney disease: Secondary | ICD-10-CM | POA: Diagnosis not present

## 2018-07-20 DIAGNOSIS — I5032 Chronic diastolic (congestive) heart failure: Secondary | ICD-10-CM | POA: Diagnosis not present

## 2018-07-20 DIAGNOSIS — M1990 Unspecified osteoarthritis, unspecified site: Secondary | ICD-10-CM | POA: Diagnosis not present

## 2018-07-20 DIAGNOSIS — G8929 Other chronic pain: Secondary | ICD-10-CM | POA: Diagnosis not present

## 2018-07-20 DIAGNOSIS — D631 Anemia in chronic kidney disease: Secondary | ICD-10-CM | POA: Diagnosis not present

## 2018-07-20 DIAGNOSIS — Z466 Encounter for fitting and adjustment of urinary device: Secondary | ICD-10-CM | POA: Diagnosis not present

## 2018-07-20 DIAGNOSIS — I69351 Hemiplegia and hemiparesis following cerebral infarction affecting right dominant side: Secondary | ICD-10-CM | POA: Diagnosis not present

## 2018-07-20 DIAGNOSIS — J439 Emphysema, unspecified: Secondary | ICD-10-CM | POA: Diagnosis not present

## 2018-07-20 DIAGNOSIS — M419 Scoliosis, unspecified: Secondary | ICD-10-CM | POA: Diagnosis not present

## 2018-07-20 DIAGNOSIS — I130 Hypertensive heart and chronic kidney disease with heart failure and stage 1 through stage 4 chronic kidney disease, or unspecified chronic kidney disease: Secondary | ICD-10-CM | POA: Diagnosis not present

## 2018-07-22 DIAGNOSIS — E1122 Type 2 diabetes mellitus with diabetic chronic kidney disease: Secondary | ICD-10-CM | POA: Diagnosis not present

## 2018-07-22 DIAGNOSIS — L89310 Pressure ulcer of right buttock, unstageable: Secondary | ICD-10-CM | POA: Diagnosis not present

## 2018-07-22 DIAGNOSIS — I69351 Hemiplegia and hemiparesis following cerebral infarction affecting right dominant side: Secondary | ICD-10-CM | POA: Diagnosis not present

## 2018-07-22 DIAGNOSIS — M549 Dorsalgia, unspecified: Secondary | ICD-10-CM | POA: Diagnosis not present

## 2018-07-22 DIAGNOSIS — G8929 Other chronic pain: Secondary | ICD-10-CM | POA: Diagnosis not present

## 2018-07-22 DIAGNOSIS — I130 Hypertensive heart and chronic kidney disease with heart failure and stage 1 through stage 4 chronic kidney disease, or unspecified chronic kidney disease: Secondary | ICD-10-CM | POA: Diagnosis not present

## 2018-07-22 DIAGNOSIS — M1990 Unspecified osteoarthritis, unspecified site: Secondary | ICD-10-CM | POA: Diagnosis not present

## 2018-07-22 DIAGNOSIS — D631 Anemia in chronic kidney disease: Secondary | ICD-10-CM | POA: Diagnosis not present

## 2018-07-22 DIAGNOSIS — J439 Emphysema, unspecified: Secondary | ICD-10-CM | POA: Diagnosis not present

## 2018-07-22 DIAGNOSIS — I5032 Chronic diastolic (congestive) heart failure: Secondary | ICD-10-CM | POA: Diagnosis not present

## 2018-07-22 DIAGNOSIS — N184 Chronic kidney disease, stage 4 (severe): Secondary | ICD-10-CM | POA: Diagnosis not present

## 2018-07-22 DIAGNOSIS — M419 Scoliosis, unspecified: Secondary | ICD-10-CM | POA: Diagnosis not present

## 2018-07-22 DIAGNOSIS — Z466 Encounter for fitting and adjustment of urinary device: Secondary | ICD-10-CM | POA: Diagnosis not present

## 2018-07-23 DIAGNOSIS — J449 Chronic obstructive pulmonary disease, unspecified: Secondary | ICD-10-CM | POA: Diagnosis not present

## 2018-07-24 ENCOUNTER — Encounter: Payer: Self-pay | Admitting: *Deleted

## 2018-07-25 DIAGNOSIS — L89310 Pressure ulcer of right buttock, unstageable: Secondary | ICD-10-CM | POA: Diagnosis not present

## 2018-07-25 DIAGNOSIS — M419 Scoliosis, unspecified: Secondary | ICD-10-CM | POA: Diagnosis not present

## 2018-07-25 DIAGNOSIS — D631 Anemia in chronic kidney disease: Secondary | ICD-10-CM | POA: Diagnosis not present

## 2018-07-25 DIAGNOSIS — Z466 Encounter for fitting and adjustment of urinary device: Secondary | ICD-10-CM | POA: Diagnosis not present

## 2018-07-25 DIAGNOSIS — G8929 Other chronic pain: Secondary | ICD-10-CM | POA: Diagnosis not present

## 2018-07-25 DIAGNOSIS — I130 Hypertensive heart and chronic kidney disease with heart failure and stage 1 through stage 4 chronic kidney disease, or unspecified chronic kidney disease: Secondary | ICD-10-CM | POA: Diagnosis not present

## 2018-07-25 DIAGNOSIS — N184 Chronic kidney disease, stage 4 (severe): Secondary | ICD-10-CM | POA: Diagnosis not present

## 2018-07-25 DIAGNOSIS — I69351 Hemiplegia and hemiparesis following cerebral infarction affecting right dominant side: Secondary | ICD-10-CM | POA: Diagnosis not present

## 2018-07-25 DIAGNOSIS — E1122 Type 2 diabetes mellitus with diabetic chronic kidney disease: Secondary | ICD-10-CM | POA: Diagnosis not present

## 2018-07-25 DIAGNOSIS — M1990 Unspecified osteoarthritis, unspecified site: Secondary | ICD-10-CM | POA: Diagnosis not present

## 2018-07-25 DIAGNOSIS — J439 Emphysema, unspecified: Secondary | ICD-10-CM | POA: Diagnosis not present

## 2018-07-25 DIAGNOSIS — I5032 Chronic diastolic (congestive) heart failure: Secondary | ICD-10-CM | POA: Diagnosis not present

## 2018-07-25 DIAGNOSIS — M549 Dorsalgia, unspecified: Secondary | ICD-10-CM | POA: Diagnosis not present

## 2018-07-27 DIAGNOSIS — Z466 Encounter for fitting and adjustment of urinary device: Secondary | ICD-10-CM | POA: Diagnosis not present

## 2018-07-27 DIAGNOSIS — J439 Emphysema, unspecified: Secondary | ICD-10-CM | POA: Diagnosis not present

## 2018-07-27 DIAGNOSIS — G8929 Other chronic pain: Secondary | ICD-10-CM | POA: Diagnosis not present

## 2018-07-27 DIAGNOSIS — I130 Hypertensive heart and chronic kidney disease with heart failure and stage 1 through stage 4 chronic kidney disease, or unspecified chronic kidney disease: Secondary | ICD-10-CM | POA: Diagnosis not present

## 2018-07-27 DIAGNOSIS — N184 Chronic kidney disease, stage 4 (severe): Secondary | ICD-10-CM | POA: Diagnosis not present

## 2018-07-27 DIAGNOSIS — E1122 Type 2 diabetes mellitus with diabetic chronic kidney disease: Secondary | ICD-10-CM | POA: Diagnosis not present

## 2018-07-27 DIAGNOSIS — M549 Dorsalgia, unspecified: Secondary | ICD-10-CM | POA: Diagnosis not present

## 2018-07-27 DIAGNOSIS — D631 Anemia in chronic kidney disease: Secondary | ICD-10-CM | POA: Diagnosis not present

## 2018-07-27 DIAGNOSIS — I69351 Hemiplegia and hemiparesis following cerebral infarction affecting right dominant side: Secondary | ICD-10-CM | POA: Diagnosis not present

## 2018-07-27 DIAGNOSIS — M419 Scoliosis, unspecified: Secondary | ICD-10-CM | POA: Diagnosis not present

## 2018-07-27 DIAGNOSIS — L89310 Pressure ulcer of right buttock, unstageable: Secondary | ICD-10-CM | POA: Diagnosis not present

## 2018-07-27 DIAGNOSIS — I5032 Chronic diastolic (congestive) heart failure: Secondary | ICD-10-CM | POA: Diagnosis not present

## 2018-07-27 DIAGNOSIS — M1990 Unspecified osteoarthritis, unspecified site: Secondary | ICD-10-CM | POA: Diagnosis not present

## 2018-07-29 DIAGNOSIS — I69351 Hemiplegia and hemiparesis following cerebral infarction affecting right dominant side: Secondary | ICD-10-CM | POA: Diagnosis not present

## 2018-07-29 DIAGNOSIS — J439 Emphysema, unspecified: Secondary | ICD-10-CM | POA: Diagnosis not present

## 2018-07-29 DIAGNOSIS — G8929 Other chronic pain: Secondary | ICD-10-CM | POA: Diagnosis not present

## 2018-07-29 DIAGNOSIS — I130 Hypertensive heart and chronic kidney disease with heart failure and stage 1 through stage 4 chronic kidney disease, or unspecified chronic kidney disease: Secondary | ICD-10-CM | POA: Diagnosis not present

## 2018-07-29 DIAGNOSIS — L89310 Pressure ulcer of right buttock, unstageable: Secondary | ICD-10-CM | POA: Diagnosis not present

## 2018-07-29 DIAGNOSIS — D631 Anemia in chronic kidney disease: Secondary | ICD-10-CM | POA: Diagnosis not present

## 2018-07-29 DIAGNOSIS — M419 Scoliosis, unspecified: Secondary | ICD-10-CM | POA: Diagnosis not present

## 2018-07-29 DIAGNOSIS — E1122 Type 2 diabetes mellitus with diabetic chronic kidney disease: Secondary | ICD-10-CM | POA: Diagnosis not present

## 2018-07-29 DIAGNOSIS — I5032 Chronic diastolic (congestive) heart failure: Secondary | ICD-10-CM | POA: Diagnosis not present

## 2018-07-29 DIAGNOSIS — Z466 Encounter for fitting and adjustment of urinary device: Secondary | ICD-10-CM | POA: Diagnosis not present

## 2018-07-29 DIAGNOSIS — M1990 Unspecified osteoarthritis, unspecified site: Secondary | ICD-10-CM | POA: Diagnosis not present

## 2018-07-29 DIAGNOSIS — M549 Dorsalgia, unspecified: Secondary | ICD-10-CM | POA: Diagnosis not present

## 2018-07-29 DIAGNOSIS — N184 Chronic kidney disease, stage 4 (severe): Secondary | ICD-10-CM | POA: Diagnosis not present

## 2018-08-01 ENCOUNTER — Other Ambulatory Visit (HOSPITAL_COMMUNITY): Payer: Self-pay | Admitting: Internal Medicine

## 2018-08-01 ENCOUNTER — Encounter (HOSPITAL_BASED_OUTPATIENT_CLINIC_OR_DEPARTMENT_OTHER): Payer: Medicare Other | Attending: Internal Medicine

## 2018-08-01 ENCOUNTER — Other Ambulatory Visit: Payer: Self-pay

## 2018-08-01 ENCOUNTER — Ambulatory Visit (HOSPITAL_COMMUNITY)
Admission: RE | Admit: 2018-08-01 | Discharge: 2018-08-01 | Disposition: A | Discharge status: Home / Self Care | Payer: Medicare Other | Source: Ambulatory Visit | Attending: Internal Medicine | Admitting: Internal Medicine

## 2018-08-01 DIAGNOSIS — I252 Old myocardial infarction: Secondary | ICD-10-CM | POA: Diagnosis not present

## 2018-08-01 DIAGNOSIS — I110 Hypertensive heart disease with heart failure: Secondary | ICD-10-CM | POA: Insufficient documentation

## 2018-08-01 DIAGNOSIS — L89313 Pressure ulcer of right buttock, stage 3: Secondary | ICD-10-CM | POA: Insufficient documentation

## 2018-08-01 DIAGNOSIS — I509 Heart failure, unspecified: Secondary | ICD-10-CM | POA: Insufficient documentation

## 2018-08-01 DIAGNOSIS — Z86718 Personal history of other venous thrombosis and embolism: Secondary | ICD-10-CM | POA: Insufficient documentation

## 2018-08-01 DIAGNOSIS — L98499 Non-pressure chronic ulcer of skin of other sites with unspecified severity: Secondary | ICD-10-CM | POA: Insufficient documentation

## 2018-08-01 DIAGNOSIS — I2510 Atherosclerotic heart disease of native coronary artery without angina pectoris: Secondary | ICD-10-CM | POA: Insufficient documentation

## 2018-08-01 DIAGNOSIS — G4730 Sleep apnea, unspecified: Secondary | ICD-10-CM | POA: Diagnosis not present

## 2018-08-01 DIAGNOSIS — Z923 Personal history of irradiation: Secondary | ICD-10-CM | POA: Insufficient documentation

## 2018-08-01 DIAGNOSIS — J449 Chronic obstructive pulmonary disease, unspecified: Secondary | ICD-10-CM | POA: Diagnosis not present

## 2018-08-01 DIAGNOSIS — L89213 Pressure ulcer of right hip, stage 3: Secondary | ICD-10-CM | POA: Diagnosis not present

## 2018-08-03 DIAGNOSIS — G8929 Other chronic pain: Secondary | ICD-10-CM | POA: Diagnosis not present

## 2018-08-03 DIAGNOSIS — D631 Anemia in chronic kidney disease: Secondary | ICD-10-CM | POA: Diagnosis not present

## 2018-08-03 DIAGNOSIS — I69351 Hemiplegia and hemiparesis following cerebral infarction affecting right dominant side: Secondary | ICD-10-CM | POA: Diagnosis not present

## 2018-08-03 DIAGNOSIS — Z466 Encounter for fitting and adjustment of urinary device: Secondary | ICD-10-CM | POA: Diagnosis not present

## 2018-08-03 DIAGNOSIS — E1122 Type 2 diabetes mellitus with diabetic chronic kidney disease: Secondary | ICD-10-CM | POA: Diagnosis not present

## 2018-08-03 DIAGNOSIS — M549 Dorsalgia, unspecified: Secondary | ICD-10-CM | POA: Diagnosis not present

## 2018-08-03 DIAGNOSIS — N184 Chronic kidney disease, stage 4 (severe): Secondary | ICD-10-CM | POA: Diagnosis not present

## 2018-08-03 DIAGNOSIS — L89310 Pressure ulcer of right buttock, unstageable: Secondary | ICD-10-CM | POA: Diagnosis not present

## 2018-08-03 DIAGNOSIS — I130 Hypertensive heart and chronic kidney disease with heart failure and stage 1 through stage 4 chronic kidney disease, or unspecified chronic kidney disease: Secondary | ICD-10-CM | POA: Diagnosis not present

## 2018-08-03 DIAGNOSIS — I5032 Chronic diastolic (congestive) heart failure: Secondary | ICD-10-CM | POA: Diagnosis not present

## 2018-08-03 DIAGNOSIS — M419 Scoliosis, unspecified: Secondary | ICD-10-CM | POA: Diagnosis not present

## 2018-08-03 DIAGNOSIS — M1990 Unspecified osteoarthritis, unspecified site: Secondary | ICD-10-CM | POA: Diagnosis not present

## 2018-08-03 DIAGNOSIS — J439 Emphysema, unspecified: Secondary | ICD-10-CM | POA: Diagnosis not present

## 2018-08-04 DIAGNOSIS — E1122 Type 2 diabetes mellitus with diabetic chronic kidney disease: Secondary | ICD-10-CM | POA: Diagnosis not present

## 2018-08-04 DIAGNOSIS — I69351 Hemiplegia and hemiparesis following cerebral infarction affecting right dominant side: Secondary | ICD-10-CM | POA: Diagnosis not present

## 2018-08-04 DIAGNOSIS — I5032 Chronic diastolic (congestive) heart failure: Secondary | ICD-10-CM | POA: Diagnosis not present

## 2018-08-04 DIAGNOSIS — D631 Anemia in chronic kidney disease: Secondary | ICD-10-CM | POA: Diagnosis not present

## 2018-08-04 DIAGNOSIS — Z466 Encounter for fitting and adjustment of urinary device: Secondary | ICD-10-CM | POA: Diagnosis not present

## 2018-08-04 DIAGNOSIS — J439 Emphysema, unspecified: Secondary | ICD-10-CM | POA: Diagnosis not present

## 2018-08-04 DIAGNOSIS — M419 Scoliosis, unspecified: Secondary | ICD-10-CM | POA: Diagnosis not present

## 2018-08-04 DIAGNOSIS — M549 Dorsalgia, unspecified: Secondary | ICD-10-CM | POA: Diagnosis not present

## 2018-08-04 DIAGNOSIS — M1990 Unspecified osteoarthritis, unspecified site: Secondary | ICD-10-CM | POA: Diagnosis not present

## 2018-08-04 DIAGNOSIS — G8929 Other chronic pain: Secondary | ICD-10-CM | POA: Diagnosis not present

## 2018-08-04 DIAGNOSIS — N184 Chronic kidney disease, stage 4 (severe): Secondary | ICD-10-CM | POA: Diagnosis not present

## 2018-08-04 DIAGNOSIS — L89310 Pressure ulcer of right buttock, unstageable: Secondary | ICD-10-CM | POA: Diagnosis not present

## 2018-08-04 DIAGNOSIS — I130 Hypertensive heart and chronic kidney disease with heart failure and stage 1 through stage 4 chronic kidney disease, or unspecified chronic kidney disease: Secondary | ICD-10-CM | POA: Diagnosis not present

## 2018-08-05 DIAGNOSIS — I130 Hypertensive heart and chronic kidney disease with heart failure and stage 1 through stage 4 chronic kidney disease, or unspecified chronic kidney disease: Secondary | ICD-10-CM | POA: Diagnosis not present

## 2018-08-05 DIAGNOSIS — J439 Emphysema, unspecified: Secondary | ICD-10-CM | POA: Diagnosis not present

## 2018-08-05 DIAGNOSIS — N184 Chronic kidney disease, stage 4 (severe): Secondary | ICD-10-CM | POA: Diagnosis not present

## 2018-08-05 DIAGNOSIS — M1990 Unspecified osteoarthritis, unspecified site: Secondary | ICD-10-CM | POA: Diagnosis not present

## 2018-08-05 DIAGNOSIS — G8929 Other chronic pain: Secondary | ICD-10-CM | POA: Diagnosis not present

## 2018-08-05 DIAGNOSIS — Z466 Encounter for fitting and adjustment of urinary device: Secondary | ICD-10-CM | POA: Diagnosis not present

## 2018-08-05 DIAGNOSIS — I5032 Chronic diastolic (congestive) heart failure: Secondary | ICD-10-CM | POA: Diagnosis not present

## 2018-08-05 DIAGNOSIS — M549 Dorsalgia, unspecified: Secondary | ICD-10-CM | POA: Diagnosis not present

## 2018-08-05 DIAGNOSIS — I69351 Hemiplegia and hemiparesis following cerebral infarction affecting right dominant side: Secondary | ICD-10-CM | POA: Diagnosis not present

## 2018-08-05 DIAGNOSIS — E1122 Type 2 diabetes mellitus with diabetic chronic kidney disease: Secondary | ICD-10-CM | POA: Diagnosis not present

## 2018-08-05 DIAGNOSIS — N319 Neuromuscular dysfunction of bladder, unspecified: Secondary | ICD-10-CM | POA: Diagnosis not present

## 2018-08-05 DIAGNOSIS — L89310 Pressure ulcer of right buttock, unstageable: Secondary | ICD-10-CM | POA: Diagnosis not present

## 2018-08-05 DIAGNOSIS — D631 Anemia in chronic kidney disease: Secondary | ICD-10-CM | POA: Diagnosis not present

## 2018-08-05 DIAGNOSIS — M419 Scoliosis, unspecified: Secondary | ICD-10-CM | POA: Diagnosis not present

## 2018-08-09 DIAGNOSIS — M549 Dorsalgia, unspecified: Secondary | ICD-10-CM | POA: Diagnosis not present

## 2018-08-09 DIAGNOSIS — J439 Emphysema, unspecified: Secondary | ICD-10-CM | POA: Diagnosis not present

## 2018-08-09 DIAGNOSIS — G8929 Other chronic pain: Secondary | ICD-10-CM | POA: Diagnosis not present

## 2018-08-09 DIAGNOSIS — D631 Anemia in chronic kidney disease: Secondary | ICD-10-CM | POA: Diagnosis not present

## 2018-08-09 DIAGNOSIS — I69351 Hemiplegia and hemiparesis following cerebral infarction affecting right dominant side: Secondary | ICD-10-CM | POA: Diagnosis not present

## 2018-08-09 DIAGNOSIS — I5032 Chronic diastolic (congestive) heart failure: Secondary | ICD-10-CM | POA: Diagnosis not present

## 2018-08-09 DIAGNOSIS — Z466 Encounter for fitting and adjustment of urinary device: Secondary | ICD-10-CM | POA: Diagnosis not present

## 2018-08-09 DIAGNOSIS — L89310 Pressure ulcer of right buttock, unstageable: Secondary | ICD-10-CM | POA: Diagnosis not present

## 2018-08-09 DIAGNOSIS — E1122 Type 2 diabetes mellitus with diabetic chronic kidney disease: Secondary | ICD-10-CM | POA: Diagnosis not present

## 2018-08-09 DIAGNOSIS — M419 Scoliosis, unspecified: Secondary | ICD-10-CM | POA: Diagnosis not present

## 2018-08-09 DIAGNOSIS — I130 Hypertensive heart and chronic kidney disease with heart failure and stage 1 through stage 4 chronic kidney disease, or unspecified chronic kidney disease: Secondary | ICD-10-CM | POA: Diagnosis not present

## 2018-08-09 DIAGNOSIS — M1990 Unspecified osteoarthritis, unspecified site: Secondary | ICD-10-CM | POA: Diagnosis not present

## 2018-08-09 DIAGNOSIS — N184 Chronic kidney disease, stage 4 (severe): Secondary | ICD-10-CM | POA: Diagnosis not present

## 2018-08-11 DIAGNOSIS — G8929 Other chronic pain: Secondary | ICD-10-CM | POA: Diagnosis not present

## 2018-08-11 DIAGNOSIS — L89310 Pressure ulcer of right buttock, unstageable: Secondary | ICD-10-CM | POA: Diagnosis not present

## 2018-08-11 DIAGNOSIS — J439 Emphysema, unspecified: Secondary | ICD-10-CM | POA: Diagnosis not present

## 2018-08-11 DIAGNOSIS — E1122 Type 2 diabetes mellitus with diabetic chronic kidney disease: Secondary | ICD-10-CM | POA: Diagnosis not present

## 2018-08-11 DIAGNOSIS — Z466 Encounter for fitting and adjustment of urinary device: Secondary | ICD-10-CM | POA: Diagnosis not present

## 2018-08-11 DIAGNOSIS — I69351 Hemiplegia and hemiparesis following cerebral infarction affecting right dominant side: Secondary | ICD-10-CM | POA: Diagnosis not present

## 2018-08-11 DIAGNOSIS — D631 Anemia in chronic kidney disease: Secondary | ICD-10-CM | POA: Diagnosis not present

## 2018-08-11 DIAGNOSIS — M1990 Unspecified osteoarthritis, unspecified site: Secondary | ICD-10-CM | POA: Diagnosis not present

## 2018-08-11 DIAGNOSIS — M549 Dorsalgia, unspecified: Secondary | ICD-10-CM | POA: Diagnosis not present

## 2018-08-11 DIAGNOSIS — M419 Scoliosis, unspecified: Secondary | ICD-10-CM | POA: Diagnosis not present

## 2018-08-11 DIAGNOSIS — N184 Chronic kidney disease, stage 4 (severe): Secondary | ICD-10-CM | POA: Diagnosis not present

## 2018-08-11 DIAGNOSIS — I5032 Chronic diastolic (congestive) heart failure: Secondary | ICD-10-CM | POA: Diagnosis not present

## 2018-08-11 DIAGNOSIS — I130 Hypertensive heart and chronic kidney disease with heart failure and stage 1 through stage 4 chronic kidney disease, or unspecified chronic kidney disease: Secondary | ICD-10-CM | POA: Diagnosis not present

## 2018-08-11 IMAGING — CT CT HEAD W/O CM
3 of 4 series · 14 of 47 positions shown, 16 images · non-contrast
Comparison: CT scan of September 26, 2015.

CLINICAL DATA: Altered mental status.

EXAM:
CT HEAD WITHOUT CONTRAST
TECHNIQUE: Contiguous axial images were obtained from the base of the skull
through the vertex without intravenous contrast.

[Series 2: head w/o · axial · non-contrast · 0.45mm/px · z∈[-146,-26]mm · 8 of 30 slices shown, 10 images]
[im 3/30  brain]
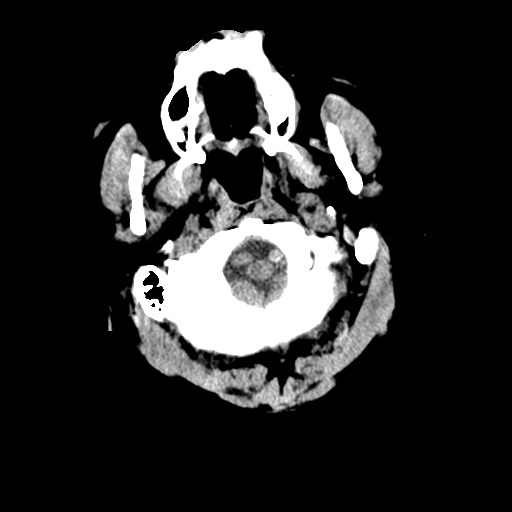
[im 3/30  bone]
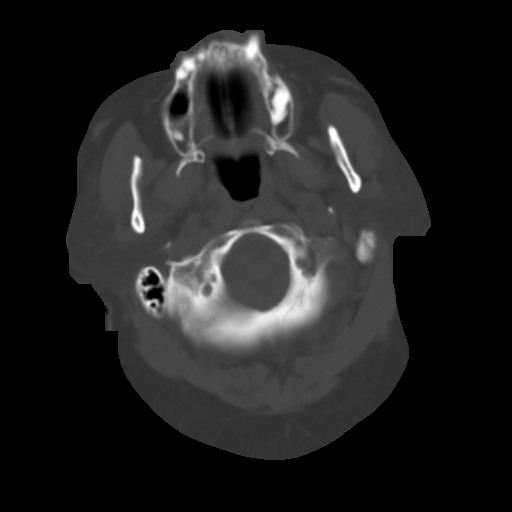
[im 6/30  brain]
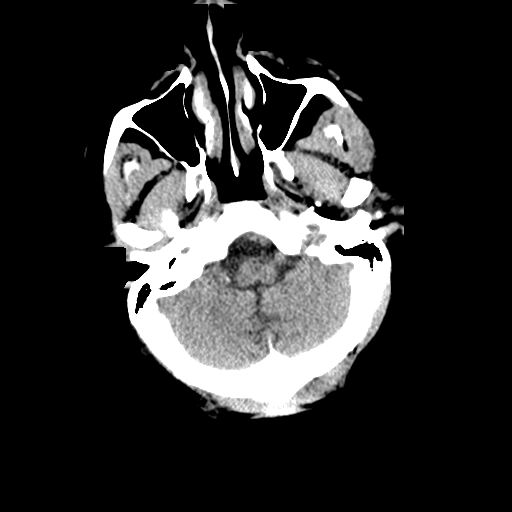
[im 9/30  brain]
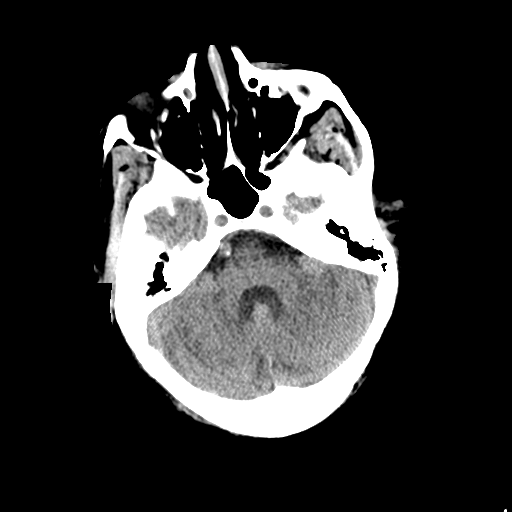
[im 12/30  brain]
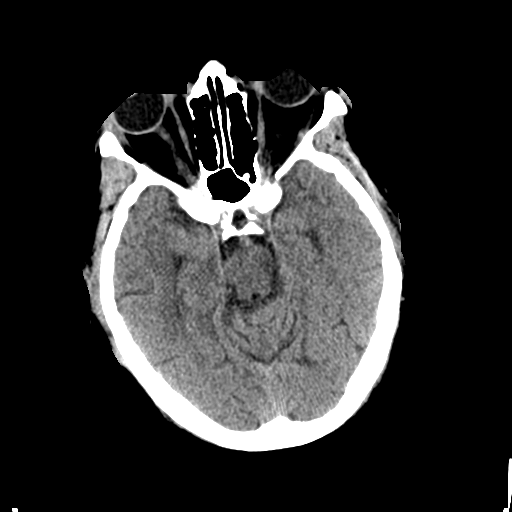
[im 18/30  brain]
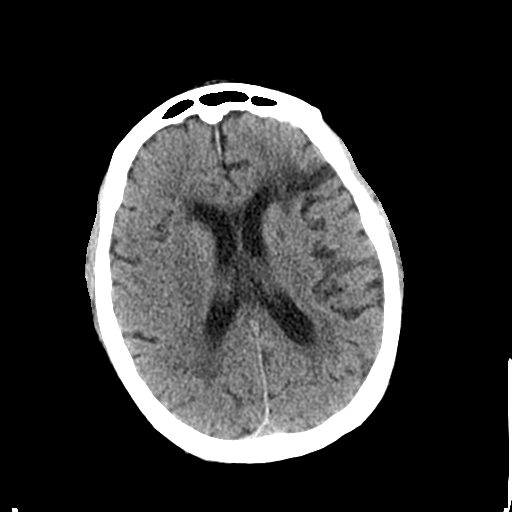
[im 18/30  bone]
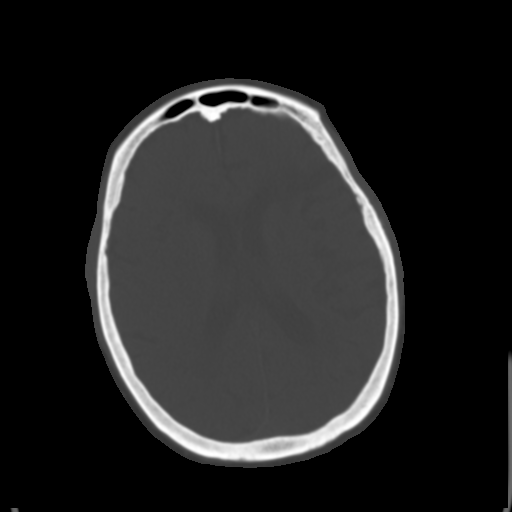
[im 21/30  brain]
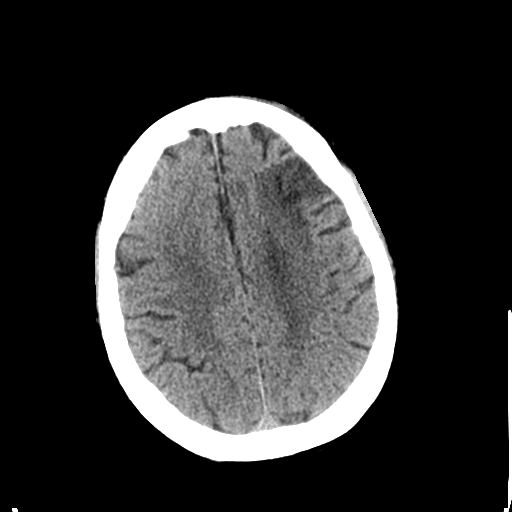
[im 24/30  brain]
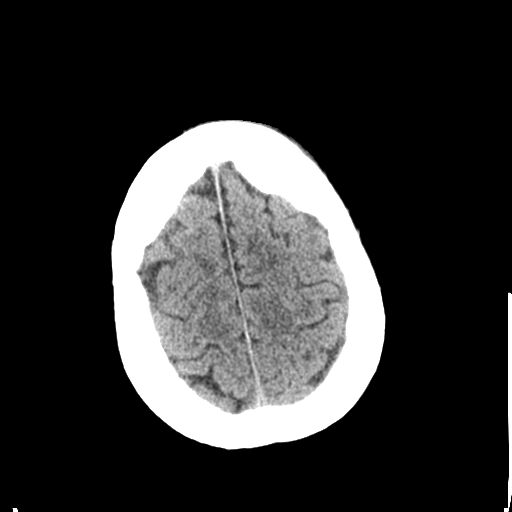
[im 27/30  brain]
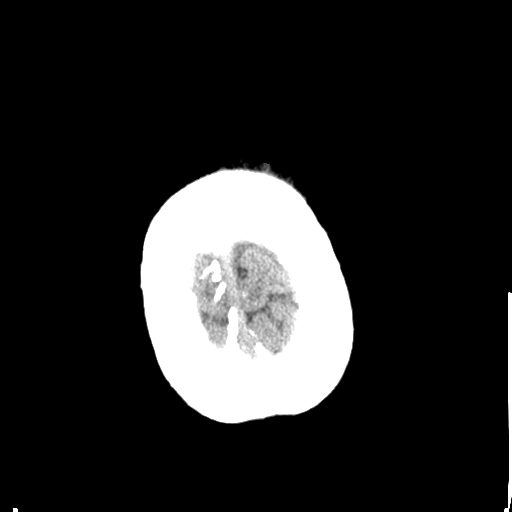

[Series 5: coronal · coronal · 0.32mm/px · 3 of 70 slices shown]
[im 24/70  brain]
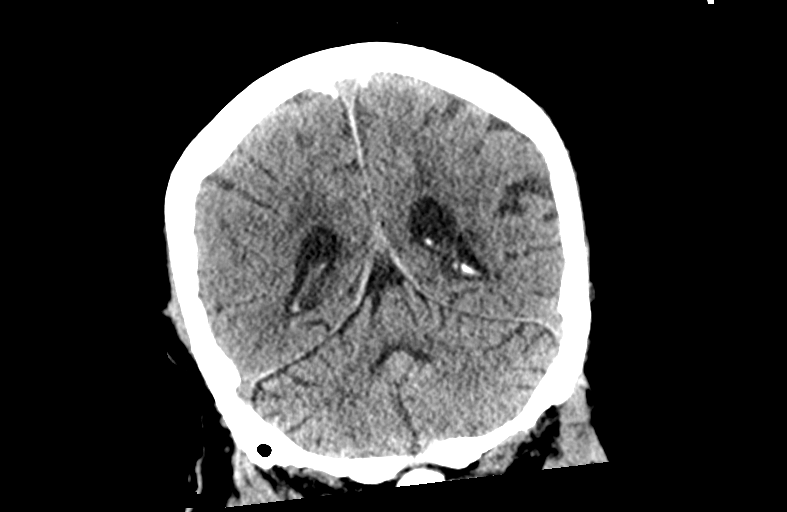
[im 31/70  brain]
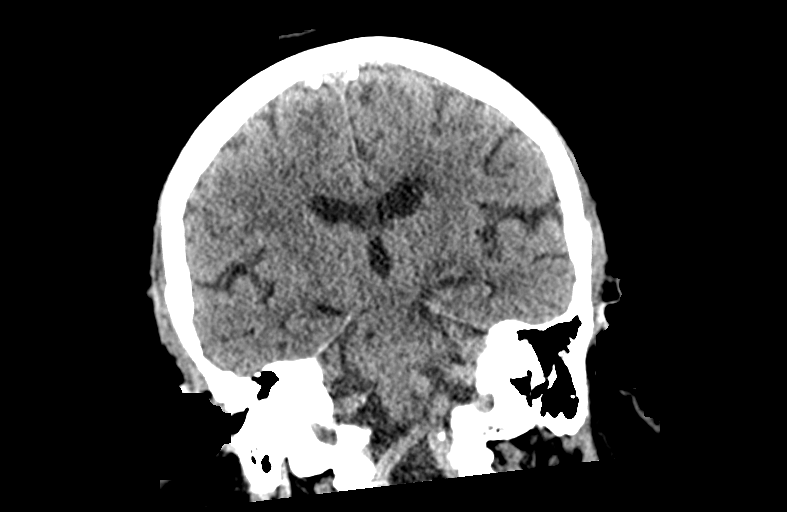
[im 39/70  brain]
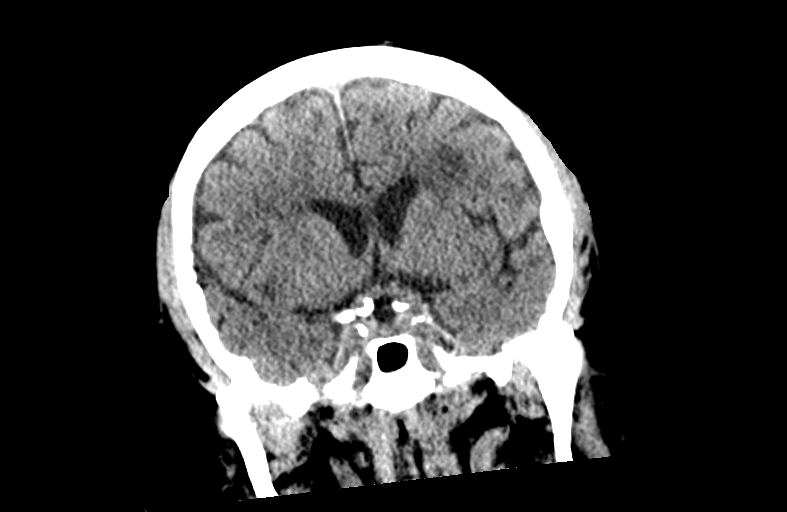

[Series 6: sagittal · sagittal · 0.32mm/px · 3 of 62 slices shown]
[im 21/62  brain]
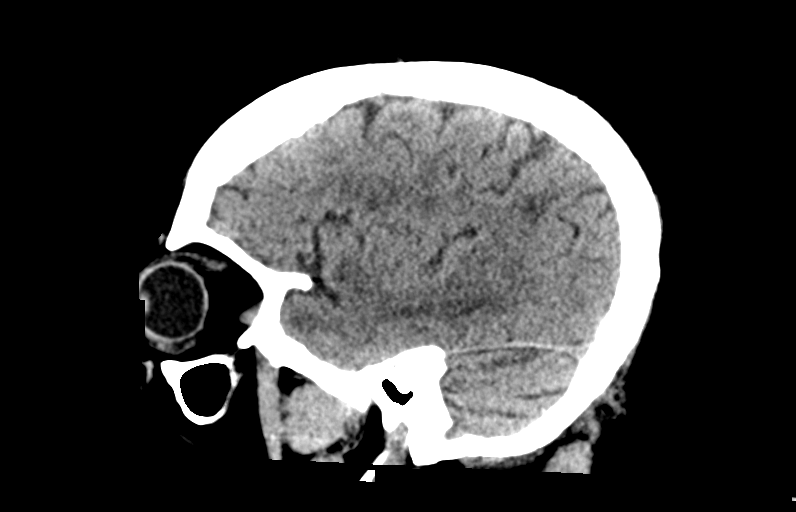
[im 31/62  brain]
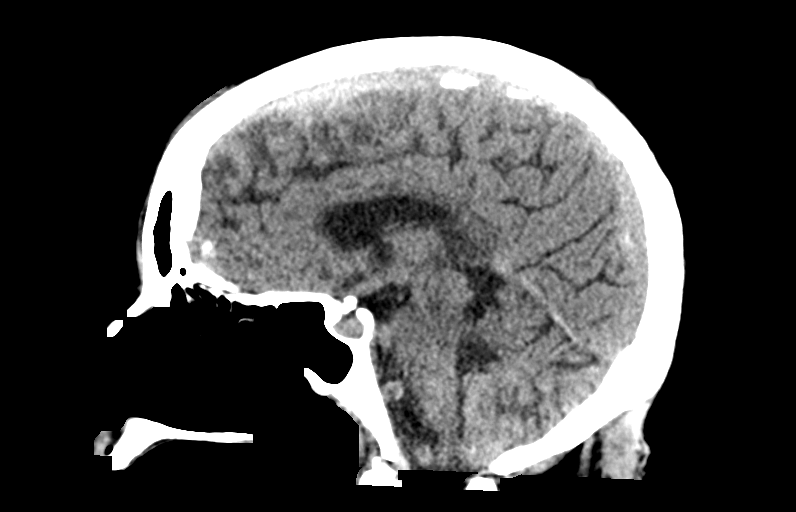
[im 41/62  brain]
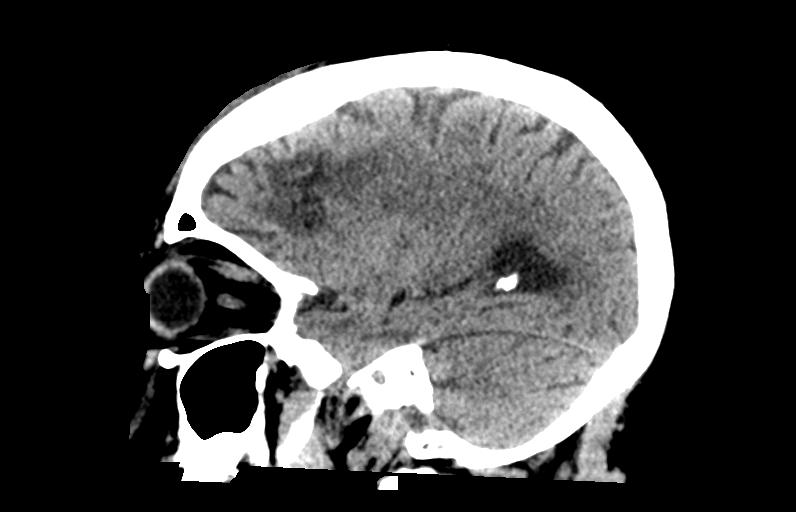

[14 of 47 positions shown; findings below may reference images not displayed]

FINDINGS: Brain: Old left frontal infarction is noted. Mild chronic ischemic
white matter disease is noted. No mass effect or midline shift is
noted. Ventricular size is within normal limits. There is no
evidence of mass lesion, hemorrhage or acute infarction.

Vascular: Atherosclerosis of carotid siphons is noted.

Skull: Bony calvarium appears intact.

Sinuses/Orbits: Paranasal sinuses are unremarkable.

Other: None.
IMPRESSION: Mild chronic ischemic white matter disease. Old left frontal
infarction. No acute intracranial abnormality seen.

## 2018-08-12 DIAGNOSIS — M419 Scoliosis, unspecified: Secondary | ICD-10-CM | POA: Diagnosis not present

## 2018-08-12 DIAGNOSIS — L89310 Pressure ulcer of right buttock, unstageable: Secondary | ICD-10-CM | POA: Diagnosis not present

## 2018-08-12 DIAGNOSIS — M549 Dorsalgia, unspecified: Secondary | ICD-10-CM | POA: Diagnosis not present

## 2018-08-12 DIAGNOSIS — J439 Emphysema, unspecified: Secondary | ICD-10-CM | POA: Diagnosis not present

## 2018-08-12 DIAGNOSIS — E1122 Type 2 diabetes mellitus with diabetic chronic kidney disease: Secondary | ICD-10-CM | POA: Diagnosis not present

## 2018-08-12 DIAGNOSIS — D631 Anemia in chronic kidney disease: Secondary | ICD-10-CM | POA: Diagnosis not present

## 2018-08-12 DIAGNOSIS — I69351 Hemiplegia and hemiparesis following cerebral infarction affecting right dominant side: Secondary | ICD-10-CM | POA: Diagnosis not present

## 2018-08-12 DIAGNOSIS — N184 Chronic kidney disease, stage 4 (severe): Secondary | ICD-10-CM | POA: Diagnosis not present

## 2018-08-12 DIAGNOSIS — I5032 Chronic diastolic (congestive) heart failure: Secondary | ICD-10-CM | POA: Diagnosis not present

## 2018-08-12 DIAGNOSIS — M1990 Unspecified osteoarthritis, unspecified site: Secondary | ICD-10-CM | POA: Diagnosis not present

## 2018-08-12 DIAGNOSIS — Z466 Encounter for fitting and adjustment of urinary device: Secondary | ICD-10-CM | POA: Diagnosis not present

## 2018-08-12 DIAGNOSIS — G8929 Other chronic pain: Secondary | ICD-10-CM | POA: Diagnosis not present

## 2018-08-12 DIAGNOSIS — I130 Hypertensive heart and chronic kidney disease with heart failure and stage 1 through stage 4 chronic kidney disease, or unspecified chronic kidney disease: Secondary | ICD-10-CM | POA: Diagnosis not present

## 2018-08-12 IMAGING — DX DG CHEST 1V PORT
1 series · 1 of 1 positions shown · non-contrast
Comparison: Chest radiograph performed 07/04/2015

CLINICAL DATA: Acute onset of fever.  Initial encounter.

EXAM:
PORTABLE CHEST 1 VIEW

[chest ap]
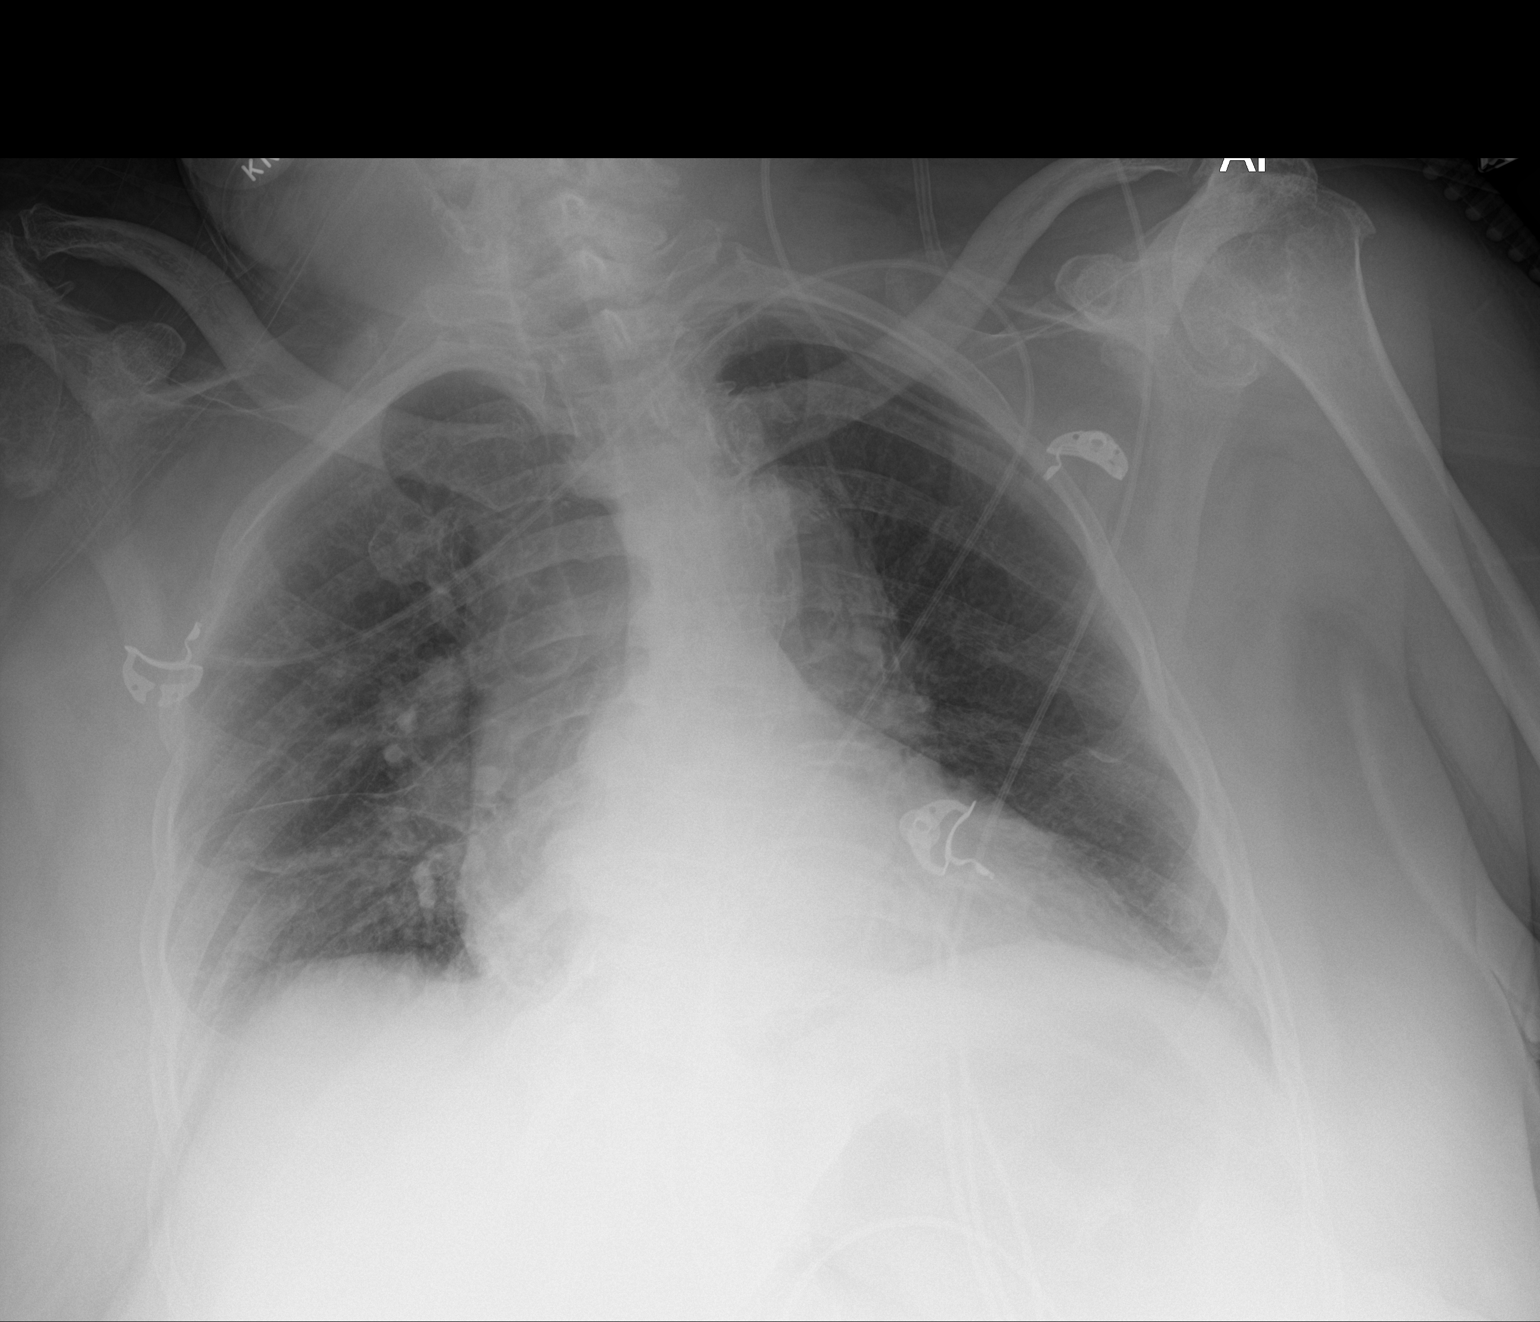

[1 of 1 positions shown; findings below may reference images not displayed]

FINDINGS: The lungs are mildly hypoexpanded. Vascular congestion is noted.
Mild bibasilar opacities may reflect atelectasis or pneumonia,
depending on the patient's symptoms. There is no evidence of pleural
effusion or pneumothorax.

The cardiomediastinal silhouette is borderline enlarged. No acute
osseous abnormalities are seen. Chronic degenerative change is noted
at the left glenohumeral joint, with superior subluxation of the
left humeral head.
IMPRESSION: Lungs mildly hypoexpanded. Vascular congestion and borderline
cardiomegaly. Mild bibasilar opacities may reflect atelectasis or
possibly pneumonia, depending on the patient's symptoms.

## 2018-08-15 ENCOUNTER — Other Ambulatory Visit (HOSPITAL_COMMUNITY)
Admission: RE | Admit: 2018-08-15 | Discharge: 2018-08-15 | Disposition: A | Discharge status: Home / Self Care | Payer: Medicare Other | Source: Other Acute Inpatient Hospital | Attending: Internal Medicine | Admitting: Internal Medicine

## 2018-08-15 DIAGNOSIS — G4730 Sleep apnea, unspecified: Secondary | ICD-10-CM | POA: Diagnosis not present

## 2018-08-15 DIAGNOSIS — I509 Heart failure, unspecified: Secondary | ICD-10-CM | POA: Diagnosis not present

## 2018-08-15 DIAGNOSIS — B964 Proteus (mirabilis) (morganii) as the cause of diseases classified elsewhere: Secondary | ICD-10-CM | POA: Diagnosis not present

## 2018-08-15 DIAGNOSIS — J449 Chronic obstructive pulmonary disease, unspecified: Secondary | ICD-10-CM | POA: Diagnosis not present

## 2018-08-15 DIAGNOSIS — L89313 Pressure ulcer of right buttock, stage 3: Secondary | ICD-10-CM | POA: Diagnosis not present

## 2018-08-15 DIAGNOSIS — I252 Old myocardial infarction: Secondary | ICD-10-CM | POA: Diagnosis not present

## 2018-08-15 DIAGNOSIS — I110 Hypertensive heart disease with heart failure: Secondary | ICD-10-CM | POA: Diagnosis not present

## 2018-08-15 DIAGNOSIS — Z923 Personal history of irradiation: Secondary | ICD-10-CM | POA: Diagnosis not present

## 2018-08-15 DIAGNOSIS — I2510 Atherosclerotic heart disease of native coronary artery without angina pectoris: Secondary | ICD-10-CM | POA: Diagnosis not present

## 2018-08-15 DIAGNOSIS — Z86718 Personal history of other venous thrombosis and embolism: Secondary | ICD-10-CM | POA: Diagnosis not present

## 2018-08-15 DIAGNOSIS — L89319 Pressure ulcer of right buttock, unspecified stage: Secondary | ICD-10-CM | POA: Diagnosis not present

## 2018-08-17 DIAGNOSIS — M1990 Unspecified osteoarthritis, unspecified site: Secondary | ICD-10-CM | POA: Diagnosis not present

## 2018-08-17 DIAGNOSIS — G8929 Other chronic pain: Secondary | ICD-10-CM | POA: Diagnosis not present

## 2018-08-17 DIAGNOSIS — M419 Scoliosis, unspecified: Secondary | ICD-10-CM | POA: Diagnosis not present

## 2018-08-17 DIAGNOSIS — D631 Anemia in chronic kidney disease: Secondary | ICD-10-CM | POA: Diagnosis not present

## 2018-08-17 DIAGNOSIS — I5032 Chronic diastolic (congestive) heart failure: Secondary | ICD-10-CM | POA: Diagnosis not present

## 2018-08-17 DIAGNOSIS — N184 Chronic kidney disease, stage 4 (severe): Secondary | ICD-10-CM | POA: Diagnosis not present

## 2018-08-17 DIAGNOSIS — Z466 Encounter for fitting and adjustment of urinary device: Secondary | ICD-10-CM | POA: Diagnosis not present

## 2018-08-17 DIAGNOSIS — E1122 Type 2 diabetes mellitus with diabetic chronic kidney disease: Secondary | ICD-10-CM | POA: Diagnosis not present

## 2018-08-17 DIAGNOSIS — I69351 Hemiplegia and hemiparesis following cerebral infarction affecting right dominant side: Secondary | ICD-10-CM | POA: Diagnosis not present

## 2018-08-17 DIAGNOSIS — M549 Dorsalgia, unspecified: Secondary | ICD-10-CM | POA: Diagnosis not present

## 2018-08-17 DIAGNOSIS — J439 Emphysema, unspecified: Secondary | ICD-10-CM | POA: Diagnosis not present

## 2018-08-17 DIAGNOSIS — I130 Hypertensive heart and chronic kidney disease with heart failure and stage 1 through stage 4 chronic kidney disease, or unspecified chronic kidney disease: Secondary | ICD-10-CM | POA: Diagnosis not present

## 2018-08-17 DIAGNOSIS — L89310 Pressure ulcer of right buttock, unstageable: Secondary | ICD-10-CM | POA: Diagnosis not present

## 2018-08-18 ENCOUNTER — Other Ambulatory Visit: Payer: Self-pay | Admitting: Internal Medicine

## 2018-08-18 DIAGNOSIS — Z79891 Long term (current) use of opiate analgesic: Secondary | ICD-10-CM

## 2018-08-18 LAB — AEROBIC CULTURE? (SUPERFICIAL SPECIMEN)

## 2018-08-18 LAB — AEROBIC CULTURE W GRAM STAIN (SUPERFICIAL SPECIMEN)

## 2018-08-18 MED ORDER — OXYCODONE HCL 10 MG PO TABS: 10.0000 mg | ORAL_TABLET | Freq: Two times a day (BID) | ORAL | 0 refills | Status: DC | PRN

## 2018-08-18 NOTE — Telephone Encounter (Signed)
Refill request   amLODipine (NORVASC) 5 MG tablet  Oxycodone HCl 10 MG TABS   [id number] - [place], [place] - [address] AT [place]

## 2018-08-19 DIAGNOSIS — L89310 Pressure ulcer of right buttock, unstageable: Secondary | ICD-10-CM | POA: Diagnosis not present

## 2018-08-19 DIAGNOSIS — J439 Emphysema, unspecified: Secondary | ICD-10-CM | POA: Diagnosis not present

## 2018-08-19 DIAGNOSIS — M419 Scoliosis, unspecified: Secondary | ICD-10-CM | POA: Diagnosis not present

## 2018-08-19 DIAGNOSIS — I130 Hypertensive heart and chronic kidney disease with heart failure and stage 1 through stage 4 chronic kidney disease, or unspecified chronic kidney disease: Secondary | ICD-10-CM | POA: Diagnosis not present

## 2018-08-19 DIAGNOSIS — I5032 Chronic diastolic (congestive) heart failure: Secondary | ICD-10-CM | POA: Diagnosis not present

## 2018-08-19 DIAGNOSIS — N184 Chronic kidney disease, stage 4 (severe): Secondary | ICD-10-CM | POA: Diagnosis not present

## 2018-08-19 DIAGNOSIS — E1122 Type 2 diabetes mellitus with diabetic chronic kidney disease: Secondary | ICD-10-CM | POA: Diagnosis not present

## 2018-08-19 DIAGNOSIS — D631 Anemia in chronic kidney disease: Secondary | ICD-10-CM | POA: Diagnosis not present

## 2018-08-19 DIAGNOSIS — M1990 Unspecified osteoarthritis, unspecified site: Secondary | ICD-10-CM | POA: Diagnosis not present

## 2018-08-19 DIAGNOSIS — I69351 Hemiplegia and hemiparesis following cerebral infarction affecting right dominant side: Secondary | ICD-10-CM | POA: Diagnosis not present

## 2018-08-19 DIAGNOSIS — Z466 Encounter for fitting and adjustment of urinary device: Secondary | ICD-10-CM | POA: Diagnosis not present

## 2018-08-19 DIAGNOSIS — G8929 Other chronic pain: Secondary | ICD-10-CM | POA: Diagnosis not present

## 2018-08-19 DIAGNOSIS — M549 Dorsalgia, unspecified: Secondary | ICD-10-CM | POA: Diagnosis not present

## 2018-08-22 DIAGNOSIS — L89310 Pressure ulcer of right buttock, unstageable: Secondary | ICD-10-CM | POA: Diagnosis not present

## 2018-08-22 DIAGNOSIS — I5032 Chronic diastolic (congestive) heart failure: Secondary | ICD-10-CM | POA: Diagnosis not present

## 2018-08-22 DIAGNOSIS — I130 Hypertensive heart and chronic kidney disease with heart failure and stage 1 through stage 4 chronic kidney disease, or unspecified chronic kidney disease: Secondary | ICD-10-CM | POA: Diagnosis not present

## 2018-08-22 DIAGNOSIS — I69351 Hemiplegia and hemiparesis following cerebral infarction affecting right dominant side: Secondary | ICD-10-CM | POA: Diagnosis not present

## 2018-08-22 DIAGNOSIS — E1122 Type 2 diabetes mellitus with diabetic chronic kidney disease: Secondary | ICD-10-CM | POA: Diagnosis not present

## 2018-08-22 DIAGNOSIS — D631 Anemia in chronic kidney disease: Secondary | ICD-10-CM | POA: Diagnosis not present

## 2018-08-22 DIAGNOSIS — M549 Dorsalgia, unspecified: Secondary | ICD-10-CM | POA: Diagnosis not present

## 2018-08-22 DIAGNOSIS — G8929 Other chronic pain: Secondary | ICD-10-CM | POA: Diagnosis not present

## 2018-08-22 DIAGNOSIS — M1990 Unspecified osteoarthritis, unspecified site: Secondary | ICD-10-CM | POA: Diagnosis not present

## 2018-08-22 DIAGNOSIS — J439 Emphysema, unspecified: Secondary | ICD-10-CM | POA: Diagnosis not present

## 2018-08-22 DIAGNOSIS — J449 Chronic obstructive pulmonary disease, unspecified: Secondary | ICD-10-CM | POA: Diagnosis not present

## 2018-08-22 DIAGNOSIS — N184 Chronic kidney disease, stage 4 (severe): Secondary | ICD-10-CM | POA: Diagnosis not present

## 2018-08-22 DIAGNOSIS — Z466 Encounter for fitting and adjustment of urinary device: Secondary | ICD-10-CM | POA: Diagnosis not present

## 2018-08-22 DIAGNOSIS — M419 Scoliosis, unspecified: Secondary | ICD-10-CM | POA: Diagnosis not present

## 2018-08-23 ENCOUNTER — Other Ambulatory Visit (HOSPITAL_COMMUNITY): Payer: Self-pay | Admitting: Internal Medicine

## 2018-08-23 ENCOUNTER — Other Ambulatory Visit: Payer: Self-pay | Admitting: Internal Medicine

## 2018-08-23 DIAGNOSIS — L89313 Pressure ulcer of right buttock, stage 3: Secondary | ICD-10-CM

## 2018-08-24 DIAGNOSIS — G4733 Obstructive sleep apnea (adult) (pediatric): Secondary | ICD-10-CM | POA: Diagnosis not present

## 2018-08-24 DIAGNOSIS — N184 Chronic kidney disease, stage 4 (severe): Secondary | ICD-10-CM | POA: Diagnosis not present

## 2018-08-24 DIAGNOSIS — E43 Unspecified severe protein-calorie malnutrition: Secondary | ICD-10-CM | POA: Diagnosis not present

## 2018-08-24 DIAGNOSIS — M1990 Unspecified osteoarthritis, unspecified site: Secondary | ICD-10-CM | POA: Diagnosis not present

## 2018-08-24 DIAGNOSIS — I69391 Dysphagia following cerebral infarction: Secondary | ICD-10-CM | POA: Diagnosis not present

## 2018-08-24 DIAGNOSIS — D631 Anemia in chronic kidney disease: Secondary | ICD-10-CM | POA: Diagnosis not present

## 2018-08-24 DIAGNOSIS — L89313 Pressure ulcer of right buttock, stage 3: Secondary | ICD-10-CM | POA: Diagnosis not present

## 2018-08-24 DIAGNOSIS — E7800 Pure hypercholesterolemia, unspecified: Secondary | ICD-10-CM | POA: Diagnosis not present

## 2018-08-24 DIAGNOSIS — L89626 Pressure-induced deep tissue damage of left heel: Secondary | ICD-10-CM | POA: Diagnosis not present

## 2018-08-24 DIAGNOSIS — I130 Hypertensive heart and chronic kidney disease with heart failure and stage 1 through stage 4 chronic kidney disease, or unspecified chronic kidney disease: Secondary | ICD-10-CM | POA: Diagnosis not present

## 2018-08-24 DIAGNOSIS — I2510 Atherosclerotic heart disease of native coronary artery without angina pectoris: Secondary | ICD-10-CM | POA: Diagnosis not present

## 2018-08-24 DIAGNOSIS — Z466 Encounter for fitting and adjustment of urinary device: Secondary | ICD-10-CM | POA: Diagnosis not present

## 2018-08-24 DIAGNOSIS — R1310 Dysphagia, unspecified: Secondary | ICD-10-CM | POA: Diagnosis not present

## 2018-08-24 DIAGNOSIS — I69351 Hemiplegia and hemiparesis following cerebral infarction affecting right dominant side: Secondary | ICD-10-CM | POA: Diagnosis not present

## 2018-08-24 DIAGNOSIS — N139 Obstructive and reflux uropathy, unspecified: Secondary | ICD-10-CM | POA: Diagnosis not present

## 2018-08-24 DIAGNOSIS — I252 Old myocardial infarction: Secondary | ICD-10-CM | POA: Diagnosis not present

## 2018-08-24 DIAGNOSIS — J439 Emphysema, unspecified: Secondary | ICD-10-CM | POA: Diagnosis not present

## 2018-08-24 DIAGNOSIS — M549 Dorsalgia, unspecified: Secondary | ICD-10-CM | POA: Diagnosis not present

## 2018-08-24 DIAGNOSIS — M419 Scoliosis, unspecified: Secondary | ICD-10-CM | POA: Diagnosis not present

## 2018-08-24 DIAGNOSIS — G8929 Other chronic pain: Secondary | ICD-10-CM | POA: Diagnosis not present

## 2018-08-24 DIAGNOSIS — E1122 Type 2 diabetes mellitus with diabetic chronic kidney disease: Secondary | ICD-10-CM | POA: Diagnosis not present

## 2018-08-24 DIAGNOSIS — I69320 Aphasia following cerebral infarction: Secondary | ICD-10-CM | POA: Diagnosis not present

## 2018-08-24 DIAGNOSIS — Z79891 Long term (current) use of opiate analgesic: Secondary | ICD-10-CM | POA: Diagnosis not present

## 2018-08-24 DIAGNOSIS — I5032 Chronic diastolic (congestive) heart failure: Secondary | ICD-10-CM | POA: Diagnosis not present

## 2018-08-26 DIAGNOSIS — Z79891 Long term (current) use of opiate analgesic: Secondary | ICD-10-CM | POA: Diagnosis not present

## 2018-08-26 DIAGNOSIS — E43 Unspecified severe protein-calorie malnutrition: Secondary | ICD-10-CM | POA: Diagnosis not present

## 2018-08-26 DIAGNOSIS — I69320 Aphasia following cerebral infarction: Secondary | ICD-10-CM | POA: Diagnosis not present

## 2018-08-26 DIAGNOSIS — I5032 Chronic diastolic (congestive) heart failure: Secondary | ICD-10-CM | POA: Diagnosis not present

## 2018-08-26 DIAGNOSIS — I252 Old myocardial infarction: Secondary | ICD-10-CM | POA: Diagnosis not present

## 2018-08-26 DIAGNOSIS — D631 Anemia in chronic kidney disease: Secondary | ICD-10-CM | POA: Diagnosis not present

## 2018-08-26 DIAGNOSIS — I69351 Hemiplegia and hemiparesis following cerebral infarction affecting right dominant side: Secondary | ICD-10-CM | POA: Diagnosis not present

## 2018-08-26 DIAGNOSIS — G4733 Obstructive sleep apnea (adult) (pediatric): Secondary | ICD-10-CM | POA: Diagnosis not present

## 2018-08-26 DIAGNOSIS — L89313 Pressure ulcer of right buttock, stage 3: Secondary | ICD-10-CM | POA: Diagnosis not present

## 2018-08-26 DIAGNOSIS — G8929 Other chronic pain: Secondary | ICD-10-CM | POA: Diagnosis not present

## 2018-08-26 DIAGNOSIS — J439 Emphysema, unspecified: Secondary | ICD-10-CM | POA: Diagnosis not present

## 2018-08-26 DIAGNOSIS — I69391 Dysphagia following cerebral infarction: Secondary | ICD-10-CM | POA: Diagnosis not present

## 2018-08-26 DIAGNOSIS — N139 Obstructive and reflux uropathy, unspecified: Secondary | ICD-10-CM | POA: Diagnosis not present

## 2018-08-26 DIAGNOSIS — Z466 Encounter for fitting and adjustment of urinary device: Secondary | ICD-10-CM | POA: Diagnosis not present

## 2018-08-26 DIAGNOSIS — R1310 Dysphagia, unspecified: Secondary | ICD-10-CM | POA: Diagnosis not present

## 2018-08-26 DIAGNOSIS — L89626 Pressure-induced deep tissue damage of left heel: Secondary | ICD-10-CM | POA: Diagnosis not present

## 2018-08-26 DIAGNOSIS — E1122 Type 2 diabetes mellitus with diabetic chronic kidney disease: Secondary | ICD-10-CM | POA: Diagnosis not present

## 2018-08-26 DIAGNOSIS — N184 Chronic kidney disease, stage 4 (severe): Secondary | ICD-10-CM | POA: Diagnosis not present

## 2018-08-26 DIAGNOSIS — I130 Hypertensive heart and chronic kidney disease with heart failure and stage 1 through stage 4 chronic kidney disease, or unspecified chronic kidney disease: Secondary | ICD-10-CM | POA: Diagnosis not present

## 2018-08-26 DIAGNOSIS — E7800 Pure hypercholesterolemia, unspecified: Secondary | ICD-10-CM | POA: Diagnosis not present

## 2018-08-26 DIAGNOSIS — M419 Scoliosis, unspecified: Secondary | ICD-10-CM | POA: Diagnosis not present

## 2018-08-26 DIAGNOSIS — M1990 Unspecified osteoarthritis, unspecified site: Secondary | ICD-10-CM | POA: Diagnosis not present

## 2018-08-26 DIAGNOSIS — I2510 Atherosclerotic heart disease of native coronary artery without angina pectoris: Secondary | ICD-10-CM | POA: Diagnosis not present

## 2018-08-26 DIAGNOSIS — M549 Dorsalgia, unspecified: Secondary | ICD-10-CM | POA: Diagnosis not present

## 2018-08-29 DIAGNOSIS — I252 Old myocardial infarction: Secondary | ICD-10-CM | POA: Diagnosis not present

## 2018-08-29 DIAGNOSIS — M419 Scoliosis, unspecified: Secondary | ICD-10-CM | POA: Diagnosis not present

## 2018-08-29 DIAGNOSIS — N139 Obstructive and reflux uropathy, unspecified: Secondary | ICD-10-CM | POA: Diagnosis not present

## 2018-08-29 DIAGNOSIS — E1122 Type 2 diabetes mellitus with diabetic chronic kidney disease: Secondary | ICD-10-CM | POA: Diagnosis not present

## 2018-08-29 DIAGNOSIS — M1990 Unspecified osteoarthritis, unspecified site: Secondary | ICD-10-CM | POA: Diagnosis not present

## 2018-08-29 DIAGNOSIS — D631 Anemia in chronic kidney disease: Secondary | ICD-10-CM | POA: Diagnosis not present

## 2018-08-29 DIAGNOSIS — R1310 Dysphagia, unspecified: Secondary | ICD-10-CM | POA: Diagnosis not present

## 2018-08-29 DIAGNOSIS — Z466 Encounter for fitting and adjustment of urinary device: Secondary | ICD-10-CM | POA: Diagnosis not present

## 2018-08-29 DIAGNOSIS — I69351 Hemiplegia and hemiparesis following cerebral infarction affecting right dominant side: Secondary | ICD-10-CM | POA: Diagnosis not present

## 2018-08-29 DIAGNOSIS — J439 Emphysema, unspecified: Secondary | ICD-10-CM | POA: Diagnosis not present

## 2018-08-29 DIAGNOSIS — E43 Unspecified severe protein-calorie malnutrition: Secondary | ICD-10-CM | POA: Diagnosis not present

## 2018-08-29 DIAGNOSIS — I69320 Aphasia following cerebral infarction: Secondary | ICD-10-CM | POA: Diagnosis not present

## 2018-08-29 DIAGNOSIS — E7800 Pure hypercholesterolemia, unspecified: Secondary | ICD-10-CM | POA: Diagnosis not present

## 2018-08-29 DIAGNOSIS — N184 Chronic kidney disease, stage 4 (severe): Secondary | ICD-10-CM | POA: Diagnosis not present

## 2018-08-29 DIAGNOSIS — I69391 Dysphagia following cerebral infarction: Secondary | ICD-10-CM | POA: Diagnosis not present

## 2018-08-29 DIAGNOSIS — G4733 Obstructive sleep apnea (adult) (pediatric): Secondary | ICD-10-CM | POA: Diagnosis not present

## 2018-08-29 DIAGNOSIS — I2510 Atherosclerotic heart disease of native coronary artery without angina pectoris: Secondary | ICD-10-CM | POA: Diagnosis not present

## 2018-08-29 DIAGNOSIS — G8929 Other chronic pain: Secondary | ICD-10-CM | POA: Diagnosis not present

## 2018-08-29 DIAGNOSIS — Z79891 Long term (current) use of opiate analgesic: Secondary | ICD-10-CM | POA: Diagnosis not present

## 2018-08-29 DIAGNOSIS — L89313 Pressure ulcer of right buttock, stage 3: Secondary | ICD-10-CM | POA: Diagnosis not present

## 2018-08-29 DIAGNOSIS — M549 Dorsalgia, unspecified: Secondary | ICD-10-CM | POA: Diagnosis not present

## 2018-08-29 DIAGNOSIS — I5032 Chronic diastolic (congestive) heart failure: Secondary | ICD-10-CM | POA: Diagnosis not present

## 2018-08-29 DIAGNOSIS — I130 Hypertensive heart and chronic kidney disease with heart failure and stage 1 through stage 4 chronic kidney disease, or unspecified chronic kidney disease: Secondary | ICD-10-CM | POA: Diagnosis not present

## 2018-08-29 DIAGNOSIS — L89626 Pressure-induced deep tissue damage of left heel: Secondary | ICD-10-CM | POA: Diagnosis not present

## 2018-08-31 DIAGNOSIS — I69320 Aphasia following cerebral infarction: Secondary | ICD-10-CM | POA: Diagnosis not present

## 2018-08-31 DIAGNOSIS — D631 Anemia in chronic kidney disease: Secondary | ICD-10-CM | POA: Diagnosis not present

## 2018-08-31 DIAGNOSIS — G4733 Obstructive sleep apnea (adult) (pediatric): Secondary | ICD-10-CM | POA: Diagnosis not present

## 2018-08-31 DIAGNOSIS — E7800 Pure hypercholesterolemia, unspecified: Secondary | ICD-10-CM | POA: Diagnosis not present

## 2018-08-31 DIAGNOSIS — M1990 Unspecified osteoarthritis, unspecified site: Secondary | ICD-10-CM | POA: Diagnosis not present

## 2018-08-31 DIAGNOSIS — E43 Unspecified severe protein-calorie malnutrition: Secondary | ICD-10-CM | POA: Diagnosis not present

## 2018-08-31 DIAGNOSIS — I2510 Atherosclerotic heart disease of native coronary artery without angina pectoris: Secondary | ICD-10-CM | POA: Diagnosis not present

## 2018-08-31 DIAGNOSIS — L89626 Pressure-induced deep tissue damage of left heel: Secondary | ICD-10-CM | POA: Diagnosis not present

## 2018-08-31 DIAGNOSIS — I252 Old myocardial infarction: Secondary | ICD-10-CM | POA: Diagnosis not present

## 2018-08-31 DIAGNOSIS — R1310 Dysphagia, unspecified: Secondary | ICD-10-CM | POA: Diagnosis not present

## 2018-08-31 DIAGNOSIS — Z466 Encounter for fitting and adjustment of urinary device: Secondary | ICD-10-CM | POA: Diagnosis not present

## 2018-08-31 DIAGNOSIS — G8929 Other chronic pain: Secondary | ICD-10-CM | POA: Diagnosis not present

## 2018-08-31 DIAGNOSIS — I5032 Chronic diastolic (congestive) heart failure: Secondary | ICD-10-CM | POA: Diagnosis not present

## 2018-08-31 DIAGNOSIS — E1122 Type 2 diabetes mellitus with diabetic chronic kidney disease: Secondary | ICD-10-CM | POA: Diagnosis not present

## 2018-08-31 DIAGNOSIS — M419 Scoliosis, unspecified: Secondary | ICD-10-CM | POA: Diagnosis not present

## 2018-08-31 DIAGNOSIS — I69351 Hemiplegia and hemiparesis following cerebral infarction affecting right dominant side: Secondary | ICD-10-CM | POA: Diagnosis not present

## 2018-08-31 DIAGNOSIS — I69391 Dysphagia following cerebral infarction: Secondary | ICD-10-CM | POA: Diagnosis not present

## 2018-08-31 DIAGNOSIS — J439 Emphysema, unspecified: Secondary | ICD-10-CM | POA: Diagnosis not present

## 2018-08-31 DIAGNOSIS — L89313 Pressure ulcer of right buttock, stage 3: Secondary | ICD-10-CM | POA: Diagnosis not present

## 2018-08-31 DIAGNOSIS — I130 Hypertensive heart and chronic kidney disease with heart failure and stage 1 through stage 4 chronic kidney disease, or unspecified chronic kidney disease: Secondary | ICD-10-CM | POA: Diagnosis not present

## 2018-08-31 DIAGNOSIS — M549 Dorsalgia, unspecified: Secondary | ICD-10-CM | POA: Diagnosis not present

## 2018-08-31 DIAGNOSIS — N139 Obstructive and reflux uropathy, unspecified: Secondary | ICD-10-CM | POA: Diagnosis not present

## 2018-08-31 DIAGNOSIS — Z79891 Long term (current) use of opiate analgesic: Secondary | ICD-10-CM | POA: Diagnosis not present

## 2018-08-31 DIAGNOSIS — N184 Chronic kidney disease, stage 4 (severe): Secondary | ICD-10-CM | POA: Diagnosis not present

## 2018-09-01 ENCOUNTER — Other Ambulatory Visit (HOSPITAL_COMMUNITY): Payer: Self-pay | Admitting: Internal Medicine

## 2018-09-01 ENCOUNTER — Ambulatory Visit (HOSPITAL_COMMUNITY)
Admission: RE | Admit: 2018-09-01 | Discharge: 2018-09-01 | Disposition: A | Discharge status: Home / Self Care | Payer: Medicare Other | Source: Ambulatory Visit | Attending: Internal Medicine | Admitting: Internal Medicine

## 2018-09-01 ENCOUNTER — Other Ambulatory Visit: Payer: Self-pay

## 2018-09-01 DIAGNOSIS — L89313 Pressure ulcer of right buttock, stage 3: Secondary | ICD-10-CM | POA: Diagnosis not present

## 2018-09-01 DIAGNOSIS — M8588 Other specified disorders of bone density and structure, other site: Secondary | ICD-10-CM | POA: Diagnosis not present

## 2018-09-01 DIAGNOSIS — M619 Calcification and ossification of muscle, unspecified: Secondary | ICD-10-CM | POA: Diagnosis not present

## 2018-09-01 DIAGNOSIS — S31000A Unspecified open wound of lower back and pelvis without penetration into retroperitoneum, initial encounter: Secondary | ICD-10-CM | POA: Diagnosis not present

## 2018-09-01 DIAGNOSIS — I700 Atherosclerosis of aorta: Secondary | ICD-10-CM | POA: Insufficient documentation

## 2018-09-01 LAB — POCT I-STAT CREATININE: Creatinine, Ser: 2.9 mg/dL — ABNORMAL HIGH (ref 0.44–1.00)

## 2018-09-01 MED ORDER — IOHEXOL 300 MG/ML  SOLN
30.0000 mL | Freq: Once | INTRAMUSCULAR | Status: AC | PRN
  Administered 2018-09-01: 30 mL via ORAL

## 2018-09-01 MED ORDER — SODIUM CHLORIDE (PF) 0.9 % IJ SOLN
INTRAMUSCULAR | Status: AC
  Filled 2018-09-01: qty 50

## 2018-09-02 DIAGNOSIS — I69320 Aphasia following cerebral infarction: Secondary | ICD-10-CM | POA: Diagnosis not present

## 2018-09-02 DIAGNOSIS — I2510 Atherosclerotic heart disease of native coronary artery without angina pectoris: Secondary | ICD-10-CM | POA: Diagnosis not present

## 2018-09-02 DIAGNOSIS — M419 Scoliosis, unspecified: Secondary | ICD-10-CM | POA: Diagnosis not present

## 2018-09-02 DIAGNOSIS — D631 Anemia in chronic kidney disease: Secondary | ICD-10-CM | POA: Diagnosis not present

## 2018-09-02 DIAGNOSIS — E1122 Type 2 diabetes mellitus with diabetic chronic kidney disease: Secondary | ICD-10-CM | POA: Diagnosis not present

## 2018-09-02 DIAGNOSIS — E7800 Pure hypercholesterolemia, unspecified: Secondary | ICD-10-CM | POA: Diagnosis not present

## 2018-09-02 DIAGNOSIS — G8111 Spastic hemiplegia affecting right dominant side: Secondary | ICD-10-CM | POA: Diagnosis not present

## 2018-09-02 DIAGNOSIS — I5032 Chronic diastolic (congestive) heart failure: Secondary | ICD-10-CM | POA: Diagnosis not present

## 2018-09-02 DIAGNOSIS — I69351 Hemiplegia and hemiparesis following cerebral infarction affecting right dominant side: Secondary | ICD-10-CM | POA: Diagnosis not present

## 2018-09-02 DIAGNOSIS — M1990 Unspecified osteoarthritis, unspecified site: Secondary | ICD-10-CM | POA: Diagnosis not present

## 2018-09-02 DIAGNOSIS — M150 Primary generalized (osteo)arthritis: Secondary | ICD-10-CM | POA: Diagnosis not present

## 2018-09-02 DIAGNOSIS — L89626 Pressure-induced deep tissue damage of left heel: Secondary | ICD-10-CM | POA: Diagnosis not present

## 2018-09-02 DIAGNOSIS — Z466 Encounter for fitting and adjustment of urinary device: Secondary | ICD-10-CM | POA: Diagnosis not present

## 2018-09-02 DIAGNOSIS — N139 Obstructive and reflux uropathy, unspecified: Secondary | ICD-10-CM | POA: Diagnosis not present

## 2018-09-02 DIAGNOSIS — J439 Emphysema, unspecified: Secondary | ICD-10-CM | POA: Diagnosis not present

## 2018-09-02 DIAGNOSIS — G8929 Other chronic pain: Secondary | ICD-10-CM | POA: Diagnosis not present

## 2018-09-02 DIAGNOSIS — M549 Dorsalgia, unspecified: Secondary | ICD-10-CM | POA: Diagnosis not present

## 2018-09-02 DIAGNOSIS — N184 Chronic kidney disease, stage 4 (severe): Secondary | ICD-10-CM | POA: Diagnosis not present

## 2018-09-02 DIAGNOSIS — E43 Unspecified severe protein-calorie malnutrition: Secondary | ICD-10-CM | POA: Diagnosis not present

## 2018-09-02 DIAGNOSIS — I639 Cerebral infarction, unspecified: Secondary | ICD-10-CM | POA: Diagnosis not present

## 2018-09-02 DIAGNOSIS — G4733 Obstructive sleep apnea (adult) (pediatric): Secondary | ICD-10-CM | POA: Diagnosis not present

## 2018-09-02 DIAGNOSIS — I69391 Dysphagia following cerebral infarction: Secondary | ICD-10-CM | POA: Diagnosis not present

## 2018-09-02 DIAGNOSIS — Z79891 Long term (current) use of opiate analgesic: Secondary | ICD-10-CM | POA: Diagnosis not present

## 2018-09-02 DIAGNOSIS — I252 Old myocardial infarction: Secondary | ICD-10-CM | POA: Diagnosis not present

## 2018-09-02 DIAGNOSIS — L89313 Pressure ulcer of right buttock, stage 3: Secondary | ICD-10-CM | POA: Diagnosis not present

## 2018-09-02 DIAGNOSIS — I130 Hypertensive heart and chronic kidney disease with heart failure and stage 1 through stage 4 chronic kidney disease, or unspecified chronic kidney disease: Secondary | ICD-10-CM | POA: Diagnosis not present

## 2018-09-02 DIAGNOSIS — R1310 Dysphagia, unspecified: Secondary | ICD-10-CM | POA: Diagnosis not present

## 2018-09-05 ENCOUNTER — Encounter (HOSPITAL_BASED_OUTPATIENT_CLINIC_OR_DEPARTMENT_OTHER): Payer: Medicare Other | Attending: Internal Medicine

## 2018-09-05 DIAGNOSIS — G4730 Sleep apnea, unspecified: Secondary | ICD-10-CM | POA: Diagnosis not present

## 2018-09-05 DIAGNOSIS — Z9221 Personal history of antineoplastic chemotherapy: Secondary | ICD-10-CM | POA: Insufficient documentation

## 2018-09-05 DIAGNOSIS — I2510 Atherosclerotic heart disease of native coronary artery without angina pectoris: Secondary | ICD-10-CM | POA: Diagnosis not present

## 2018-09-05 DIAGNOSIS — I252 Old myocardial infarction: Secondary | ICD-10-CM | POA: Diagnosis not present

## 2018-09-05 DIAGNOSIS — Z86718 Personal history of other venous thrombosis and embolism: Secondary | ICD-10-CM | POA: Insufficient documentation

## 2018-09-05 DIAGNOSIS — I509 Heart failure, unspecified: Secondary | ICD-10-CM | POA: Diagnosis not present

## 2018-09-05 DIAGNOSIS — L89629 Pressure ulcer of left heel, unspecified stage: Secondary | ICD-10-CM | POA: Diagnosis not present

## 2018-09-05 DIAGNOSIS — L89313 Pressure ulcer of right buttock, stage 3: Secondary | ICD-10-CM | POA: Diagnosis not present

## 2018-09-05 DIAGNOSIS — I110 Hypertensive heart disease with heart failure: Secondary | ICD-10-CM | POA: Diagnosis not present

## 2018-09-05 DIAGNOSIS — Z923 Personal history of irradiation: Secondary | ICD-10-CM | POA: Insufficient documentation

## 2018-09-05 DIAGNOSIS — J449 Chronic obstructive pulmonary disease, unspecified: Secondary | ICD-10-CM | POA: Diagnosis not present

## 2018-09-05 DIAGNOSIS — L89623 Pressure ulcer of left heel, stage 3: Secondary | ICD-10-CM | POA: Insufficient documentation

## 2018-09-05 DIAGNOSIS — M8668 Other chronic osteomyelitis, other site: Secondary | ICD-10-CM | POA: Diagnosis not present

## 2018-09-05 DIAGNOSIS — L89319 Pressure ulcer of right buttock, unspecified stage: Secondary | ICD-10-CM | POA: Diagnosis not present

## 2018-09-07 DIAGNOSIS — M549 Dorsalgia, unspecified: Secondary | ICD-10-CM | POA: Diagnosis not present

## 2018-09-07 DIAGNOSIS — E43 Unspecified severe protein-calorie malnutrition: Secondary | ICD-10-CM | POA: Diagnosis not present

## 2018-09-07 DIAGNOSIS — Z466 Encounter for fitting and adjustment of urinary device: Secondary | ICD-10-CM | POA: Diagnosis not present

## 2018-09-07 DIAGNOSIS — I252 Old myocardial infarction: Secondary | ICD-10-CM | POA: Diagnosis not present

## 2018-09-07 DIAGNOSIS — N139 Obstructive and reflux uropathy, unspecified: Secondary | ICD-10-CM | POA: Diagnosis not present

## 2018-09-07 DIAGNOSIS — N184 Chronic kidney disease, stage 4 (severe): Secondary | ICD-10-CM | POA: Diagnosis not present

## 2018-09-07 DIAGNOSIS — D631 Anemia in chronic kidney disease: Secondary | ICD-10-CM | POA: Diagnosis not present

## 2018-09-07 DIAGNOSIS — I69391 Dysphagia following cerebral infarction: Secondary | ICD-10-CM | POA: Diagnosis not present

## 2018-09-07 DIAGNOSIS — M1990 Unspecified osteoarthritis, unspecified site: Secondary | ICD-10-CM | POA: Diagnosis not present

## 2018-09-07 DIAGNOSIS — L89626 Pressure-induced deep tissue damage of left heel: Secondary | ICD-10-CM | POA: Diagnosis not present

## 2018-09-07 DIAGNOSIS — G8929 Other chronic pain: Secondary | ICD-10-CM | POA: Diagnosis not present

## 2018-09-07 DIAGNOSIS — M419 Scoliosis, unspecified: Secondary | ICD-10-CM | POA: Diagnosis not present

## 2018-09-07 DIAGNOSIS — I69351 Hemiplegia and hemiparesis following cerebral infarction affecting right dominant side: Secondary | ICD-10-CM | POA: Diagnosis not present

## 2018-09-07 DIAGNOSIS — I130 Hypertensive heart and chronic kidney disease with heart failure and stage 1 through stage 4 chronic kidney disease, or unspecified chronic kidney disease: Secondary | ICD-10-CM | POA: Diagnosis not present

## 2018-09-07 DIAGNOSIS — R1310 Dysphagia, unspecified: Secondary | ICD-10-CM | POA: Diagnosis not present

## 2018-09-07 DIAGNOSIS — E1122 Type 2 diabetes mellitus with diabetic chronic kidney disease: Secondary | ICD-10-CM | POA: Diagnosis not present

## 2018-09-07 DIAGNOSIS — Z79891 Long term (current) use of opiate analgesic: Secondary | ICD-10-CM | POA: Diagnosis not present

## 2018-09-07 DIAGNOSIS — I69320 Aphasia following cerebral infarction: Secondary | ICD-10-CM | POA: Diagnosis not present

## 2018-09-07 DIAGNOSIS — E7800 Pure hypercholesterolemia, unspecified: Secondary | ICD-10-CM | POA: Diagnosis not present

## 2018-09-07 DIAGNOSIS — G4733 Obstructive sleep apnea (adult) (pediatric): Secondary | ICD-10-CM | POA: Diagnosis not present

## 2018-09-07 DIAGNOSIS — I5032 Chronic diastolic (congestive) heart failure: Secondary | ICD-10-CM | POA: Diagnosis not present

## 2018-09-07 DIAGNOSIS — L89313 Pressure ulcer of right buttock, stage 3: Secondary | ICD-10-CM | POA: Diagnosis not present

## 2018-09-07 DIAGNOSIS — I2510 Atherosclerotic heart disease of native coronary artery without angina pectoris: Secondary | ICD-10-CM | POA: Diagnosis not present

## 2018-09-07 DIAGNOSIS — J439 Emphysema, unspecified: Secondary | ICD-10-CM | POA: Diagnosis not present

## 2018-09-09 DIAGNOSIS — E7800 Pure hypercholesterolemia, unspecified: Secondary | ICD-10-CM | POA: Diagnosis not present

## 2018-09-09 DIAGNOSIS — N184 Chronic kidney disease, stage 4 (severe): Secondary | ICD-10-CM | POA: Diagnosis not present

## 2018-09-09 DIAGNOSIS — I5032 Chronic diastolic (congestive) heart failure: Secondary | ICD-10-CM | POA: Diagnosis not present

## 2018-09-09 DIAGNOSIS — I69391 Dysphagia following cerebral infarction: Secondary | ICD-10-CM | POA: Diagnosis not present

## 2018-09-09 DIAGNOSIS — G4733 Obstructive sleep apnea (adult) (pediatric): Secondary | ICD-10-CM | POA: Diagnosis not present

## 2018-09-09 DIAGNOSIS — M1990 Unspecified osteoarthritis, unspecified site: Secondary | ICD-10-CM | POA: Diagnosis not present

## 2018-09-09 DIAGNOSIS — M419 Scoliosis, unspecified: Secondary | ICD-10-CM | POA: Diagnosis not present

## 2018-09-09 DIAGNOSIS — L89626 Pressure-induced deep tissue damage of left heel: Secondary | ICD-10-CM | POA: Diagnosis not present

## 2018-09-09 DIAGNOSIS — E1122 Type 2 diabetes mellitus with diabetic chronic kidney disease: Secondary | ICD-10-CM | POA: Diagnosis not present

## 2018-09-09 DIAGNOSIS — R1310 Dysphagia, unspecified: Secondary | ICD-10-CM | POA: Diagnosis not present

## 2018-09-09 DIAGNOSIS — J439 Emphysema, unspecified: Secondary | ICD-10-CM | POA: Diagnosis not present

## 2018-09-09 DIAGNOSIS — I69351 Hemiplegia and hemiparesis following cerebral infarction affecting right dominant side: Secondary | ICD-10-CM | POA: Diagnosis not present

## 2018-09-09 DIAGNOSIS — G8929 Other chronic pain: Secondary | ICD-10-CM | POA: Diagnosis not present

## 2018-09-09 DIAGNOSIS — Z466 Encounter for fitting and adjustment of urinary device: Secondary | ICD-10-CM | POA: Diagnosis not present

## 2018-09-09 DIAGNOSIS — D631 Anemia in chronic kidney disease: Secondary | ICD-10-CM | POA: Diagnosis not present

## 2018-09-09 DIAGNOSIS — Z79891 Long term (current) use of opiate analgesic: Secondary | ICD-10-CM | POA: Diagnosis not present

## 2018-09-09 DIAGNOSIS — E43 Unspecified severe protein-calorie malnutrition: Secondary | ICD-10-CM | POA: Diagnosis not present

## 2018-09-09 DIAGNOSIS — N139 Obstructive and reflux uropathy, unspecified: Secondary | ICD-10-CM | POA: Diagnosis not present

## 2018-09-09 DIAGNOSIS — L89313 Pressure ulcer of right buttock, stage 3: Secondary | ICD-10-CM | POA: Diagnosis not present

## 2018-09-09 DIAGNOSIS — I69320 Aphasia following cerebral infarction: Secondary | ICD-10-CM | POA: Diagnosis not present

## 2018-09-09 DIAGNOSIS — M549 Dorsalgia, unspecified: Secondary | ICD-10-CM | POA: Diagnosis not present

## 2018-09-09 DIAGNOSIS — I2510 Atherosclerotic heart disease of native coronary artery without angina pectoris: Secondary | ICD-10-CM | POA: Diagnosis not present

## 2018-09-09 DIAGNOSIS — I130 Hypertensive heart and chronic kidney disease with heart failure and stage 1 through stage 4 chronic kidney disease, or unspecified chronic kidney disease: Secondary | ICD-10-CM | POA: Diagnosis not present

## 2018-09-09 DIAGNOSIS — I252 Old myocardial infarction: Secondary | ICD-10-CM | POA: Diagnosis not present

## 2018-09-13 DIAGNOSIS — D631 Anemia in chronic kidney disease: Secondary | ICD-10-CM | POA: Diagnosis not present

## 2018-09-13 DIAGNOSIS — M549 Dorsalgia, unspecified: Secondary | ICD-10-CM | POA: Diagnosis not present

## 2018-09-13 DIAGNOSIS — N139 Obstructive and reflux uropathy, unspecified: Secondary | ICD-10-CM | POA: Diagnosis not present

## 2018-09-13 DIAGNOSIS — N184 Chronic kidney disease, stage 4 (severe): Secondary | ICD-10-CM | POA: Diagnosis not present

## 2018-09-13 DIAGNOSIS — J439 Emphysema, unspecified: Secondary | ICD-10-CM | POA: Diagnosis not present

## 2018-09-13 DIAGNOSIS — E1122 Type 2 diabetes mellitus with diabetic chronic kidney disease: Secondary | ICD-10-CM | POA: Diagnosis not present

## 2018-09-13 DIAGNOSIS — M419 Scoliosis, unspecified: Secondary | ICD-10-CM | POA: Diagnosis not present

## 2018-09-13 DIAGNOSIS — Z466 Encounter for fitting and adjustment of urinary device: Secondary | ICD-10-CM | POA: Diagnosis not present

## 2018-09-13 DIAGNOSIS — M1990 Unspecified osteoarthritis, unspecified site: Secondary | ICD-10-CM | POA: Diagnosis not present

## 2018-09-13 DIAGNOSIS — L89313 Pressure ulcer of right buttock, stage 3: Secondary | ICD-10-CM | POA: Diagnosis not present

## 2018-09-13 DIAGNOSIS — I130 Hypertensive heart and chronic kidney disease with heart failure and stage 1 through stage 4 chronic kidney disease, or unspecified chronic kidney disease: Secondary | ICD-10-CM | POA: Diagnosis not present

## 2018-09-13 DIAGNOSIS — L89626 Pressure-induced deep tissue damage of left heel: Secondary | ICD-10-CM | POA: Diagnosis not present

## 2018-09-13 DIAGNOSIS — E43 Unspecified severe protein-calorie malnutrition: Secondary | ICD-10-CM | POA: Diagnosis not present

## 2018-09-13 DIAGNOSIS — I2510 Atherosclerotic heart disease of native coronary artery without angina pectoris: Secondary | ICD-10-CM | POA: Diagnosis not present

## 2018-09-13 DIAGNOSIS — I252 Old myocardial infarction: Secondary | ICD-10-CM | POA: Diagnosis not present

## 2018-09-13 DIAGNOSIS — Z79891 Long term (current) use of opiate analgesic: Secondary | ICD-10-CM | POA: Diagnosis not present

## 2018-09-13 DIAGNOSIS — I5032 Chronic diastolic (congestive) heart failure: Secondary | ICD-10-CM | POA: Diagnosis not present

## 2018-09-13 DIAGNOSIS — I69391 Dysphagia following cerebral infarction: Secondary | ICD-10-CM | POA: Diagnosis not present

## 2018-09-13 DIAGNOSIS — I69320 Aphasia following cerebral infarction: Secondary | ICD-10-CM | POA: Diagnosis not present

## 2018-09-13 DIAGNOSIS — G8929 Other chronic pain: Secondary | ICD-10-CM | POA: Diagnosis not present

## 2018-09-13 DIAGNOSIS — I69351 Hemiplegia and hemiparesis following cerebral infarction affecting right dominant side: Secondary | ICD-10-CM | POA: Diagnosis not present

## 2018-09-13 DIAGNOSIS — G4733 Obstructive sleep apnea (adult) (pediatric): Secondary | ICD-10-CM | POA: Diagnosis not present

## 2018-09-13 DIAGNOSIS — R1310 Dysphagia, unspecified: Secondary | ICD-10-CM | POA: Diagnosis not present

## 2018-09-13 DIAGNOSIS — E7800 Pure hypercholesterolemia, unspecified: Secondary | ICD-10-CM | POA: Diagnosis not present

## 2018-09-14 DIAGNOSIS — E43 Unspecified severe protein-calorie malnutrition: Secondary | ICD-10-CM | POA: Diagnosis not present

## 2018-09-14 DIAGNOSIS — D631 Anemia in chronic kidney disease: Secondary | ICD-10-CM | POA: Diagnosis not present

## 2018-09-14 DIAGNOSIS — I2510 Atherosclerotic heart disease of native coronary artery without angina pectoris: Secondary | ICD-10-CM | POA: Diagnosis not present

## 2018-09-14 DIAGNOSIS — M549 Dorsalgia, unspecified: Secondary | ICD-10-CM | POA: Diagnosis not present

## 2018-09-14 DIAGNOSIS — I130 Hypertensive heart and chronic kidney disease with heart failure and stage 1 through stage 4 chronic kidney disease, or unspecified chronic kidney disease: Secondary | ICD-10-CM | POA: Diagnosis not present

## 2018-09-14 DIAGNOSIS — G8929 Other chronic pain: Secondary | ICD-10-CM | POA: Diagnosis not present

## 2018-09-14 DIAGNOSIS — L89626 Pressure-induced deep tissue damage of left heel: Secondary | ICD-10-CM | POA: Diagnosis not present

## 2018-09-14 DIAGNOSIS — L89313 Pressure ulcer of right buttock, stage 3: Secondary | ICD-10-CM | POA: Diagnosis not present

## 2018-09-14 DIAGNOSIS — J439 Emphysema, unspecified: Secondary | ICD-10-CM | POA: Diagnosis not present

## 2018-09-14 DIAGNOSIS — I252 Old myocardial infarction: Secondary | ICD-10-CM | POA: Diagnosis not present

## 2018-09-14 DIAGNOSIS — M419 Scoliosis, unspecified: Secondary | ICD-10-CM | POA: Diagnosis not present

## 2018-09-14 DIAGNOSIS — G4733 Obstructive sleep apnea (adult) (pediatric): Secondary | ICD-10-CM | POA: Diagnosis not present

## 2018-09-14 DIAGNOSIS — N139 Obstructive and reflux uropathy, unspecified: Secondary | ICD-10-CM | POA: Diagnosis not present

## 2018-09-14 DIAGNOSIS — E7800 Pure hypercholesterolemia, unspecified: Secondary | ICD-10-CM | POA: Diagnosis not present

## 2018-09-14 DIAGNOSIS — I5032 Chronic diastolic (congestive) heart failure: Secondary | ICD-10-CM | POA: Diagnosis not present

## 2018-09-14 DIAGNOSIS — E1122 Type 2 diabetes mellitus with diabetic chronic kidney disease: Secondary | ICD-10-CM | POA: Diagnosis not present

## 2018-09-14 DIAGNOSIS — Z79891 Long term (current) use of opiate analgesic: Secondary | ICD-10-CM | POA: Diagnosis not present

## 2018-09-14 DIAGNOSIS — N184 Chronic kidney disease, stage 4 (severe): Secondary | ICD-10-CM | POA: Diagnosis not present

## 2018-09-14 DIAGNOSIS — I69351 Hemiplegia and hemiparesis following cerebral infarction affecting right dominant side: Secondary | ICD-10-CM | POA: Diagnosis not present

## 2018-09-14 DIAGNOSIS — M1990 Unspecified osteoarthritis, unspecified site: Secondary | ICD-10-CM | POA: Diagnosis not present

## 2018-09-14 DIAGNOSIS — I69320 Aphasia following cerebral infarction: Secondary | ICD-10-CM | POA: Diagnosis not present

## 2018-09-14 DIAGNOSIS — I69391 Dysphagia following cerebral infarction: Secondary | ICD-10-CM | POA: Diagnosis not present

## 2018-09-14 DIAGNOSIS — Z466 Encounter for fitting and adjustment of urinary device: Secondary | ICD-10-CM | POA: Diagnosis not present

## 2018-09-14 DIAGNOSIS — R1310 Dysphagia, unspecified: Secondary | ICD-10-CM | POA: Diagnosis not present

## 2018-09-16 DIAGNOSIS — Z466 Encounter for fitting and adjustment of urinary device: Secondary | ICD-10-CM | POA: Diagnosis not present

## 2018-09-16 DIAGNOSIS — G8929 Other chronic pain: Secondary | ICD-10-CM | POA: Diagnosis not present

## 2018-09-16 DIAGNOSIS — D631 Anemia in chronic kidney disease: Secondary | ICD-10-CM | POA: Diagnosis not present

## 2018-09-16 DIAGNOSIS — I2510 Atherosclerotic heart disease of native coronary artery without angina pectoris: Secondary | ICD-10-CM | POA: Diagnosis not present

## 2018-09-16 DIAGNOSIS — Z79891 Long term (current) use of opiate analgesic: Secondary | ICD-10-CM | POA: Diagnosis not present

## 2018-09-16 DIAGNOSIS — L89313 Pressure ulcer of right buttock, stage 3: Secondary | ICD-10-CM | POA: Diagnosis not present

## 2018-09-16 DIAGNOSIS — L89626 Pressure-induced deep tissue damage of left heel: Secondary | ICD-10-CM | POA: Diagnosis not present

## 2018-09-16 DIAGNOSIS — I69391 Dysphagia following cerebral infarction: Secondary | ICD-10-CM | POA: Diagnosis not present

## 2018-09-16 DIAGNOSIS — N139 Obstructive and reflux uropathy, unspecified: Secondary | ICD-10-CM | POA: Diagnosis not present

## 2018-09-16 DIAGNOSIS — R1310 Dysphagia, unspecified: Secondary | ICD-10-CM | POA: Diagnosis not present

## 2018-09-16 DIAGNOSIS — I69320 Aphasia following cerebral infarction: Secondary | ICD-10-CM | POA: Diagnosis not present

## 2018-09-16 DIAGNOSIS — E43 Unspecified severe protein-calorie malnutrition: Secondary | ICD-10-CM | POA: Diagnosis not present

## 2018-09-16 DIAGNOSIS — J439 Emphysema, unspecified: Secondary | ICD-10-CM | POA: Diagnosis not present

## 2018-09-16 DIAGNOSIS — M549 Dorsalgia, unspecified: Secondary | ICD-10-CM | POA: Diagnosis not present

## 2018-09-16 DIAGNOSIS — G4733 Obstructive sleep apnea (adult) (pediatric): Secondary | ICD-10-CM | POA: Diagnosis not present

## 2018-09-16 DIAGNOSIS — N319 Neuromuscular dysfunction of bladder, unspecified: Secondary | ICD-10-CM | POA: Diagnosis not present

## 2018-09-16 DIAGNOSIS — E1122 Type 2 diabetes mellitus with diabetic chronic kidney disease: Secondary | ICD-10-CM | POA: Diagnosis not present

## 2018-09-16 DIAGNOSIS — I130 Hypertensive heart and chronic kidney disease with heart failure and stage 1 through stage 4 chronic kidney disease, or unspecified chronic kidney disease: Secondary | ICD-10-CM | POA: Diagnosis not present

## 2018-09-16 DIAGNOSIS — M419 Scoliosis, unspecified: Secondary | ICD-10-CM | POA: Diagnosis not present

## 2018-09-16 DIAGNOSIS — N184 Chronic kidney disease, stage 4 (severe): Secondary | ICD-10-CM | POA: Diagnosis not present

## 2018-09-16 DIAGNOSIS — M1990 Unspecified osteoarthritis, unspecified site: Secondary | ICD-10-CM | POA: Diagnosis not present

## 2018-09-16 DIAGNOSIS — E7800 Pure hypercholesterolemia, unspecified: Secondary | ICD-10-CM | POA: Diagnosis not present

## 2018-09-16 DIAGNOSIS — L89620 Pressure ulcer of left heel, unstageable: Secondary | ICD-10-CM | POA: Diagnosis not present

## 2018-09-16 DIAGNOSIS — I69351 Hemiplegia and hemiparesis following cerebral infarction affecting right dominant side: Secondary | ICD-10-CM | POA: Diagnosis not present

## 2018-09-16 DIAGNOSIS — I252 Old myocardial infarction: Secondary | ICD-10-CM | POA: Diagnosis not present

## 2018-09-16 DIAGNOSIS — I5032 Chronic diastolic (congestive) heart failure: Secondary | ICD-10-CM | POA: Diagnosis not present

## 2018-09-19 DIAGNOSIS — L89313 Pressure ulcer of right buttock, stage 3: Secondary | ICD-10-CM | POA: Diagnosis not present

## 2018-09-19 DIAGNOSIS — Z9221 Personal history of antineoplastic chemotherapy: Secondary | ICD-10-CM | POA: Diagnosis not present

## 2018-09-19 DIAGNOSIS — L89629 Pressure ulcer of left heel, unspecified stage: Secondary | ICD-10-CM | POA: Diagnosis not present

## 2018-09-19 DIAGNOSIS — L89319 Pressure ulcer of right buttock, unspecified stage: Secondary | ICD-10-CM | POA: Diagnosis not present

## 2018-09-19 DIAGNOSIS — Z923 Personal history of irradiation: Secondary | ICD-10-CM | POA: Diagnosis not present

## 2018-09-19 DIAGNOSIS — I2510 Atherosclerotic heart disease of native coronary artery without angina pectoris: Secondary | ICD-10-CM | POA: Diagnosis not present

## 2018-09-19 DIAGNOSIS — Z86718 Personal history of other venous thrombosis and embolism: Secondary | ICD-10-CM | POA: Diagnosis not present

## 2018-09-19 DIAGNOSIS — I509 Heart failure, unspecified: Secondary | ICD-10-CM | POA: Diagnosis not present

## 2018-09-19 DIAGNOSIS — I110 Hypertensive heart disease with heart failure: Secondary | ICD-10-CM | POA: Diagnosis not present

## 2018-09-19 DIAGNOSIS — I252 Old myocardial infarction: Secondary | ICD-10-CM | POA: Diagnosis not present

## 2018-09-19 DIAGNOSIS — J449 Chronic obstructive pulmonary disease, unspecified: Secondary | ICD-10-CM | POA: Diagnosis not present

## 2018-09-19 DIAGNOSIS — G4730 Sleep apnea, unspecified: Secondary | ICD-10-CM | POA: Diagnosis not present

## 2018-09-19 DIAGNOSIS — L89623 Pressure ulcer of left heel, stage 3: Secondary | ICD-10-CM | POA: Diagnosis not present

## 2018-09-20 DIAGNOSIS — M419 Scoliosis, unspecified: Secondary | ICD-10-CM | POA: Diagnosis not present

## 2018-09-20 DIAGNOSIS — I69351 Hemiplegia and hemiparesis following cerebral infarction affecting right dominant side: Secondary | ICD-10-CM | POA: Diagnosis not present

## 2018-09-20 DIAGNOSIS — L89310 Pressure ulcer of right buttock, unstageable: Secondary | ICD-10-CM | POA: Diagnosis not present

## 2018-09-20 DIAGNOSIS — D631 Anemia in chronic kidney disease: Secondary | ICD-10-CM | POA: Diagnosis not present

## 2018-09-20 DIAGNOSIS — M549 Dorsalgia, unspecified: Secondary | ICD-10-CM | POA: Diagnosis not present

## 2018-09-20 DIAGNOSIS — Z466 Encounter for fitting and adjustment of urinary device: Secondary | ICD-10-CM | POA: Diagnosis not present

## 2018-09-20 DIAGNOSIS — J439 Emphysema, unspecified: Secondary | ICD-10-CM | POA: Diagnosis not present

## 2018-09-20 DIAGNOSIS — E1122 Type 2 diabetes mellitus with diabetic chronic kidney disease: Secondary | ICD-10-CM | POA: Diagnosis not present

## 2018-09-20 DIAGNOSIS — M1990 Unspecified osteoarthritis, unspecified site: Secondary | ICD-10-CM | POA: Diagnosis not present

## 2018-09-20 DIAGNOSIS — N184 Chronic kidney disease, stage 4 (severe): Secondary | ICD-10-CM | POA: Diagnosis not present

## 2018-09-20 DIAGNOSIS — I130 Hypertensive heart and chronic kidney disease with heart failure and stage 1 through stage 4 chronic kidney disease, or unspecified chronic kidney disease: Secondary | ICD-10-CM | POA: Diagnosis not present

## 2018-09-20 DIAGNOSIS — G8929 Other chronic pain: Secondary | ICD-10-CM | POA: Diagnosis not present

## 2018-09-20 DIAGNOSIS — I5032 Chronic diastolic (congestive) heart failure: Secondary | ICD-10-CM | POA: Diagnosis not present

## 2018-09-21 DIAGNOSIS — G8929 Other chronic pain: Secondary | ICD-10-CM | POA: Diagnosis not present

## 2018-09-21 DIAGNOSIS — E43 Unspecified severe protein-calorie malnutrition: Secondary | ICD-10-CM | POA: Diagnosis not present

## 2018-09-21 DIAGNOSIS — I2510 Atherosclerotic heart disease of native coronary artery without angina pectoris: Secondary | ICD-10-CM | POA: Diagnosis not present

## 2018-09-21 DIAGNOSIS — G4733 Obstructive sleep apnea (adult) (pediatric): Secondary | ICD-10-CM | POA: Diagnosis not present

## 2018-09-21 DIAGNOSIS — Z466 Encounter for fitting and adjustment of urinary device: Secondary | ICD-10-CM | POA: Diagnosis not present

## 2018-09-21 DIAGNOSIS — I69320 Aphasia following cerebral infarction: Secondary | ICD-10-CM | POA: Diagnosis not present

## 2018-09-21 DIAGNOSIS — R1310 Dysphagia, unspecified: Secondary | ICD-10-CM | POA: Diagnosis not present

## 2018-09-21 DIAGNOSIS — I5032 Chronic diastolic (congestive) heart failure: Secondary | ICD-10-CM | POA: Diagnosis not present

## 2018-09-21 DIAGNOSIS — N184 Chronic kidney disease, stage 4 (severe): Secondary | ICD-10-CM | POA: Diagnosis not present

## 2018-09-21 DIAGNOSIS — J439 Emphysema, unspecified: Secondary | ICD-10-CM | POA: Diagnosis not present

## 2018-09-21 DIAGNOSIS — M1990 Unspecified osteoarthritis, unspecified site: Secondary | ICD-10-CM | POA: Diagnosis not present

## 2018-09-21 DIAGNOSIS — L89313 Pressure ulcer of right buttock, stage 3: Secondary | ICD-10-CM | POA: Diagnosis not present

## 2018-09-21 DIAGNOSIS — I69391 Dysphagia following cerebral infarction: Secondary | ICD-10-CM | POA: Diagnosis not present

## 2018-09-21 DIAGNOSIS — E1122 Type 2 diabetes mellitus with diabetic chronic kidney disease: Secondary | ICD-10-CM | POA: Diagnosis not present

## 2018-09-21 DIAGNOSIS — Z79891 Long term (current) use of opiate analgesic: Secondary | ICD-10-CM | POA: Diagnosis not present

## 2018-09-21 DIAGNOSIS — I130 Hypertensive heart and chronic kidney disease with heart failure and stage 1 through stage 4 chronic kidney disease, or unspecified chronic kidney disease: Secondary | ICD-10-CM | POA: Diagnosis not present

## 2018-09-21 DIAGNOSIS — M549 Dorsalgia, unspecified: Secondary | ICD-10-CM | POA: Diagnosis not present

## 2018-09-21 DIAGNOSIS — E7800 Pure hypercholesterolemia, unspecified: Secondary | ICD-10-CM | POA: Diagnosis not present

## 2018-09-21 DIAGNOSIS — D631 Anemia in chronic kidney disease: Secondary | ICD-10-CM | POA: Diagnosis not present

## 2018-09-21 DIAGNOSIS — I252 Old myocardial infarction: Secondary | ICD-10-CM | POA: Diagnosis not present

## 2018-09-21 DIAGNOSIS — L89626 Pressure-induced deep tissue damage of left heel: Secondary | ICD-10-CM | POA: Diagnosis not present

## 2018-09-21 DIAGNOSIS — N139 Obstructive and reflux uropathy, unspecified: Secondary | ICD-10-CM | POA: Diagnosis not present

## 2018-09-21 DIAGNOSIS — M419 Scoliosis, unspecified: Secondary | ICD-10-CM | POA: Diagnosis not present

## 2018-09-21 DIAGNOSIS — I69351 Hemiplegia and hemiparesis following cerebral infarction affecting right dominant side: Secondary | ICD-10-CM | POA: Diagnosis not present

## 2018-09-22 ENCOUNTER — Other Ambulatory Visit: Payer: Self-pay

## 2018-09-22 ENCOUNTER — Encounter: Payer: Self-pay | Admitting: Internal Medicine

## 2018-09-22 ENCOUNTER — Ambulatory Visit (INDEPENDENT_AMBULATORY_CARE_PROVIDER_SITE_OTHER): Payer: Medicare Other | Admitting: Internal Medicine

## 2018-09-22 DIAGNOSIS — M4628 Osteomyelitis of vertebra, sacral and sacrococcygeal region: Secondary | ICD-10-CM

## 2018-09-22 DIAGNOSIS — J449 Chronic obstructive pulmonary disease, unspecified: Secondary | ICD-10-CM | POA: Diagnosis not present

## 2018-09-23 DIAGNOSIS — I130 Hypertensive heart and chronic kidney disease with heart failure and stage 1 through stage 4 chronic kidney disease, or unspecified chronic kidney disease: Secondary | ICD-10-CM | POA: Diagnosis not present

## 2018-09-23 DIAGNOSIS — M549 Dorsalgia, unspecified: Secondary | ICD-10-CM | POA: Diagnosis not present

## 2018-09-23 DIAGNOSIS — J439 Emphysema, unspecified: Secondary | ICD-10-CM | POA: Diagnosis not present

## 2018-09-23 DIAGNOSIS — M1990 Unspecified osteoarthritis, unspecified site: Secondary | ICD-10-CM | POA: Diagnosis not present

## 2018-09-23 DIAGNOSIS — G4733 Obstructive sleep apnea (adult) (pediatric): Secondary | ICD-10-CM | POA: Diagnosis not present

## 2018-09-23 DIAGNOSIS — I2510 Atherosclerotic heart disease of native coronary artery without angina pectoris: Secondary | ICD-10-CM | POA: Diagnosis not present

## 2018-09-23 DIAGNOSIS — I69320 Aphasia following cerebral infarction: Secondary | ICD-10-CM | POA: Diagnosis not present

## 2018-09-23 DIAGNOSIS — I252 Old myocardial infarction: Secondary | ICD-10-CM | POA: Diagnosis not present

## 2018-09-23 DIAGNOSIS — I69351 Hemiplegia and hemiparesis following cerebral infarction affecting right dominant side: Secondary | ICD-10-CM | POA: Diagnosis not present

## 2018-09-23 DIAGNOSIS — I5032 Chronic diastolic (congestive) heart failure: Secondary | ICD-10-CM | POA: Diagnosis not present

## 2018-09-23 DIAGNOSIS — L89626 Pressure-induced deep tissue damage of left heel: Secondary | ICD-10-CM | POA: Diagnosis not present

## 2018-09-23 DIAGNOSIS — D631 Anemia in chronic kidney disease: Secondary | ICD-10-CM | POA: Diagnosis not present

## 2018-09-23 DIAGNOSIS — E7800 Pure hypercholesterolemia, unspecified: Secondary | ICD-10-CM | POA: Diagnosis not present

## 2018-09-23 DIAGNOSIS — G8929 Other chronic pain: Secondary | ICD-10-CM | POA: Diagnosis not present

## 2018-09-23 DIAGNOSIS — E43 Unspecified severe protein-calorie malnutrition: Secondary | ICD-10-CM | POA: Diagnosis not present

## 2018-09-23 DIAGNOSIS — Z79891 Long term (current) use of opiate analgesic: Secondary | ICD-10-CM | POA: Diagnosis not present

## 2018-09-23 DIAGNOSIS — Z466 Encounter for fitting and adjustment of urinary device: Secondary | ICD-10-CM | POA: Diagnosis not present

## 2018-09-23 DIAGNOSIS — E1122 Type 2 diabetes mellitus with diabetic chronic kidney disease: Secondary | ICD-10-CM | POA: Diagnosis not present

## 2018-09-23 DIAGNOSIS — I69391 Dysphagia following cerebral infarction: Secondary | ICD-10-CM | POA: Diagnosis not present

## 2018-09-23 DIAGNOSIS — L89313 Pressure ulcer of right buttock, stage 3: Secondary | ICD-10-CM | POA: Diagnosis not present

## 2018-09-23 DIAGNOSIS — M419 Scoliosis, unspecified: Secondary | ICD-10-CM | POA: Diagnosis not present

## 2018-09-23 DIAGNOSIS — N184 Chronic kidney disease, stage 4 (severe): Secondary | ICD-10-CM | POA: Diagnosis not present

## 2018-09-23 DIAGNOSIS — R1310 Dysphagia, unspecified: Secondary | ICD-10-CM | POA: Diagnosis not present

## 2018-09-23 DIAGNOSIS — N139 Obstructive and reflux uropathy, unspecified: Secondary | ICD-10-CM | POA: Diagnosis not present

## 2018-09-26 DIAGNOSIS — N139 Obstructive and reflux uropathy, unspecified: Secondary | ICD-10-CM | POA: Diagnosis not present

## 2018-09-26 DIAGNOSIS — R1310 Dysphagia, unspecified: Secondary | ICD-10-CM | POA: Diagnosis not present

## 2018-09-26 DIAGNOSIS — M1990 Unspecified osteoarthritis, unspecified site: Secondary | ICD-10-CM | POA: Diagnosis not present

## 2018-09-26 DIAGNOSIS — N184 Chronic kidney disease, stage 4 (severe): Secondary | ICD-10-CM | POA: Diagnosis not present

## 2018-09-26 DIAGNOSIS — Z79891 Long term (current) use of opiate analgesic: Secondary | ICD-10-CM | POA: Diagnosis not present

## 2018-09-26 DIAGNOSIS — E1122 Type 2 diabetes mellitus with diabetic chronic kidney disease: Secondary | ICD-10-CM | POA: Diagnosis not present

## 2018-09-26 DIAGNOSIS — I69351 Hemiplegia and hemiparesis following cerebral infarction affecting right dominant side: Secondary | ICD-10-CM | POA: Diagnosis not present

## 2018-09-26 DIAGNOSIS — I69391 Dysphagia following cerebral infarction: Secondary | ICD-10-CM | POA: Diagnosis not present

## 2018-09-26 DIAGNOSIS — I252 Old myocardial infarction: Secondary | ICD-10-CM | POA: Diagnosis not present

## 2018-09-26 DIAGNOSIS — I69320 Aphasia following cerebral infarction: Secondary | ICD-10-CM | POA: Diagnosis not present

## 2018-09-26 DIAGNOSIS — L89313 Pressure ulcer of right buttock, stage 3: Secondary | ICD-10-CM | POA: Diagnosis not present

## 2018-09-26 DIAGNOSIS — D631 Anemia in chronic kidney disease: Secondary | ICD-10-CM | POA: Diagnosis not present

## 2018-09-26 DIAGNOSIS — I5032 Chronic diastolic (congestive) heart failure: Secondary | ICD-10-CM | POA: Diagnosis not present

## 2018-09-26 DIAGNOSIS — I2510 Atherosclerotic heart disease of native coronary artery without angina pectoris: Secondary | ICD-10-CM | POA: Diagnosis not present

## 2018-09-26 DIAGNOSIS — J439 Emphysema, unspecified: Secondary | ICD-10-CM | POA: Diagnosis not present

## 2018-09-26 DIAGNOSIS — M419 Scoliosis, unspecified: Secondary | ICD-10-CM | POA: Diagnosis not present

## 2018-09-26 DIAGNOSIS — G4733 Obstructive sleep apnea (adult) (pediatric): Secondary | ICD-10-CM | POA: Diagnosis not present

## 2018-09-26 DIAGNOSIS — E7800 Pure hypercholesterolemia, unspecified: Secondary | ICD-10-CM | POA: Diagnosis not present

## 2018-09-26 DIAGNOSIS — M549 Dorsalgia, unspecified: Secondary | ICD-10-CM | POA: Diagnosis not present

## 2018-09-26 DIAGNOSIS — I130 Hypertensive heart and chronic kidney disease with heart failure and stage 1 through stage 4 chronic kidney disease, or unspecified chronic kidney disease: Secondary | ICD-10-CM | POA: Diagnosis not present

## 2018-09-26 DIAGNOSIS — Z466 Encounter for fitting and adjustment of urinary device: Secondary | ICD-10-CM | POA: Diagnosis not present

## 2018-09-26 DIAGNOSIS — L89626 Pressure-induced deep tissue damage of left heel: Secondary | ICD-10-CM | POA: Diagnosis not present

## 2018-09-26 DIAGNOSIS — E43 Unspecified severe protein-calorie malnutrition: Secondary | ICD-10-CM | POA: Diagnosis not present

## 2018-09-26 DIAGNOSIS — G8929 Other chronic pain: Secondary | ICD-10-CM | POA: Diagnosis not present

## 2018-09-28 DIAGNOSIS — L89626 Pressure-induced deep tissue damage of left heel: Secondary | ICD-10-CM | POA: Diagnosis not present

## 2018-09-28 DIAGNOSIS — Z466 Encounter for fitting and adjustment of urinary device: Secondary | ICD-10-CM | POA: Diagnosis not present

## 2018-09-28 DIAGNOSIS — M1990 Unspecified osteoarthritis, unspecified site: Secondary | ICD-10-CM | POA: Diagnosis not present

## 2018-09-28 DIAGNOSIS — R1310 Dysphagia, unspecified: Secondary | ICD-10-CM | POA: Diagnosis not present

## 2018-09-28 DIAGNOSIS — I2510 Atherosclerotic heart disease of native coronary artery without angina pectoris: Secondary | ICD-10-CM | POA: Diagnosis not present

## 2018-09-28 DIAGNOSIS — E7800 Pure hypercholesterolemia, unspecified: Secondary | ICD-10-CM | POA: Diagnosis not present

## 2018-09-28 DIAGNOSIS — N139 Obstructive and reflux uropathy, unspecified: Secondary | ICD-10-CM | POA: Diagnosis not present

## 2018-09-28 DIAGNOSIS — I130 Hypertensive heart and chronic kidney disease with heart failure and stage 1 through stage 4 chronic kidney disease, or unspecified chronic kidney disease: Secondary | ICD-10-CM | POA: Diagnosis not present

## 2018-09-28 DIAGNOSIS — E1122 Type 2 diabetes mellitus with diabetic chronic kidney disease: Secondary | ICD-10-CM | POA: Diagnosis not present

## 2018-09-28 DIAGNOSIS — I5032 Chronic diastolic (congestive) heart failure: Secondary | ICD-10-CM | POA: Diagnosis not present

## 2018-09-28 DIAGNOSIS — N184 Chronic kidney disease, stage 4 (severe): Secondary | ICD-10-CM | POA: Diagnosis not present

## 2018-09-28 DIAGNOSIS — I639 Cerebral infarction, unspecified: Secondary | ICD-10-CM | POA: Diagnosis not present

## 2018-09-28 DIAGNOSIS — E43 Unspecified severe protein-calorie malnutrition: Secondary | ICD-10-CM | POA: Diagnosis not present

## 2018-09-28 DIAGNOSIS — L89313 Pressure ulcer of right buttock, stage 3: Secondary | ICD-10-CM | POA: Diagnosis not present

## 2018-09-28 DIAGNOSIS — G8929 Other chronic pain: Secondary | ICD-10-CM | POA: Diagnosis not present

## 2018-09-28 DIAGNOSIS — D631 Anemia in chronic kidney disease: Secondary | ICD-10-CM | POA: Diagnosis not present

## 2018-09-28 DIAGNOSIS — M549 Dorsalgia, unspecified: Secondary | ICD-10-CM | POA: Diagnosis not present

## 2018-09-28 DIAGNOSIS — G4733 Obstructive sleep apnea (adult) (pediatric): Secondary | ICD-10-CM | POA: Diagnosis not present

## 2018-09-28 DIAGNOSIS — I69320 Aphasia following cerebral infarction: Secondary | ICD-10-CM | POA: Diagnosis not present

## 2018-09-28 DIAGNOSIS — I69391 Dysphagia following cerebral infarction: Secondary | ICD-10-CM | POA: Diagnosis not present

## 2018-09-28 DIAGNOSIS — M150 Primary generalized (osteo)arthritis: Secondary | ICD-10-CM | POA: Diagnosis not present

## 2018-09-28 DIAGNOSIS — G8111 Spastic hemiplegia affecting right dominant side: Secondary | ICD-10-CM | POA: Diagnosis not present

## 2018-09-28 DIAGNOSIS — M419 Scoliosis, unspecified: Secondary | ICD-10-CM | POA: Diagnosis not present

## 2018-09-28 DIAGNOSIS — I69351 Hemiplegia and hemiparesis following cerebral infarction affecting right dominant side: Secondary | ICD-10-CM | POA: Diagnosis not present

## 2018-09-28 DIAGNOSIS — Z79891 Long term (current) use of opiate analgesic: Secondary | ICD-10-CM | POA: Diagnosis not present

## 2018-09-28 DIAGNOSIS — I252 Old myocardial infarction: Secondary | ICD-10-CM | POA: Diagnosis not present

## 2018-09-28 DIAGNOSIS — J439 Emphysema, unspecified: Secondary | ICD-10-CM | POA: Diagnosis not present

## 2018-09-30 ENCOUNTER — Telehealth: Payer: Self-pay

## 2018-09-30 DIAGNOSIS — Z79891 Long term (current) use of opiate analgesic: Secondary | ICD-10-CM

## 2018-09-30 DIAGNOSIS — G4733 Obstructive sleep apnea (adult) (pediatric): Secondary | ICD-10-CM | POA: Diagnosis not present

## 2018-09-30 DIAGNOSIS — G8929 Other chronic pain: Secondary | ICD-10-CM | POA: Diagnosis not present

## 2018-09-30 DIAGNOSIS — M419 Scoliosis, unspecified: Secondary | ICD-10-CM | POA: Diagnosis not present

## 2018-09-30 DIAGNOSIS — Z466 Encounter for fitting and adjustment of urinary device: Secondary | ICD-10-CM | POA: Diagnosis not present

## 2018-09-30 DIAGNOSIS — L89626 Pressure-induced deep tissue damage of left heel: Secondary | ICD-10-CM | POA: Diagnosis not present

## 2018-09-30 DIAGNOSIS — M1990 Unspecified osteoarthritis, unspecified site: Secondary | ICD-10-CM | POA: Diagnosis not present

## 2018-09-30 DIAGNOSIS — I252 Old myocardial infarction: Secondary | ICD-10-CM | POA: Diagnosis not present

## 2018-09-30 DIAGNOSIS — I130 Hypertensive heart and chronic kidney disease with heart failure and stage 1 through stage 4 chronic kidney disease, or unspecified chronic kidney disease: Secondary | ICD-10-CM | POA: Diagnosis not present

## 2018-09-30 DIAGNOSIS — I5032 Chronic diastolic (congestive) heart failure: Secondary | ICD-10-CM | POA: Diagnosis not present

## 2018-09-30 DIAGNOSIS — L89313 Pressure ulcer of right buttock, stage 3: Secondary | ICD-10-CM | POA: Diagnosis not present

## 2018-09-30 DIAGNOSIS — E43 Unspecified severe protein-calorie malnutrition: Secondary | ICD-10-CM | POA: Diagnosis not present

## 2018-09-30 DIAGNOSIS — I69391 Dysphagia following cerebral infarction: Secondary | ICD-10-CM | POA: Diagnosis not present

## 2018-09-30 DIAGNOSIS — D631 Anemia in chronic kidney disease: Secondary | ICD-10-CM | POA: Diagnosis not present

## 2018-09-30 DIAGNOSIS — N184 Chronic kidney disease, stage 4 (severe): Secondary | ICD-10-CM | POA: Diagnosis not present

## 2018-09-30 DIAGNOSIS — M549 Dorsalgia, unspecified: Secondary | ICD-10-CM | POA: Diagnosis not present

## 2018-09-30 DIAGNOSIS — I2510 Atherosclerotic heart disease of native coronary artery without angina pectoris: Secondary | ICD-10-CM | POA: Diagnosis not present

## 2018-09-30 DIAGNOSIS — J439 Emphysema, unspecified: Secondary | ICD-10-CM | POA: Diagnosis not present

## 2018-09-30 DIAGNOSIS — E1122 Type 2 diabetes mellitus with diabetic chronic kidney disease: Secondary | ICD-10-CM | POA: Diagnosis not present

## 2018-09-30 DIAGNOSIS — I69320 Aphasia following cerebral infarction: Secondary | ICD-10-CM | POA: Diagnosis not present

## 2018-09-30 DIAGNOSIS — I69351 Hemiplegia and hemiparesis following cerebral infarction affecting right dominant side: Secondary | ICD-10-CM | POA: Diagnosis not present

## 2018-09-30 DIAGNOSIS — E7800 Pure hypercholesterolemia, unspecified: Secondary | ICD-10-CM | POA: Diagnosis not present

## 2018-09-30 DIAGNOSIS — N139 Obstructive and reflux uropathy, unspecified: Secondary | ICD-10-CM | POA: Diagnosis not present

## 2018-09-30 DIAGNOSIS — R1310 Dysphagia, unspecified: Secondary | ICD-10-CM | POA: Diagnosis not present

## 2018-09-30 NOTE — Telephone Encounter (Signed)
Oxycodone HCl 10 MG TABS   REFILL REQUEST @  [facility] [place], [address] AT [place] [phone] (Phone) [phone] (Fax)

## 2018-09-30 NOTE — Telephone Encounter (Signed)
Last rx written 08/18/18. Last OV 05/30/18. Next OV has not been scheduled. UDS 02/28/18.

## 2018-10-03 NOTE — Progress Notes (Signed)
RFV: televisit for sacral osteomyelitis  Patient ID: Michelle Holmes, female   DOB: 1950-08-12, 68 y.o.   MRN: [id number]  HPI 68yo F with hx of CVA, hemiplegia, deconditioned- sacral wound that is long standing. Being cared at wound care center. She was referred for evaluation sacral osteomyelitis after worsening drainage and imaging showing:  IMPRESSION: 1. Right sacral wound with packing material overlying the right ischium. Erosion of the posterior cortex of the right ischium, suggestive of associated osteomyelitis. 2. Bulky heterotopic calcifications overlie the posterior right hip. 3. Mild distention of the rectum by formed stool.   Outpatient Encounter Medications as of 09/22/2018  Medication Sig  . acetaminophen (TYLENOL) 500 MG tablet Take 500 mg by mouth every 6 (six) hours as needed for moderate pain.  Marland Kitchen amLODipine (NORVASC) 5 MG tablet Take 1 tablet (5 mg total) by mouth daily.  . collagenase (SANTYL) ointment Apply 1 application topically daily.  . Diapers & Supplies MISC Please provide patient with adult diapers and pads  . feeding supplement, ENSURE COMPLETE, (ENSURE COMPLETE) LIQD Take 237 mLs by mouth 2 (two) times daily between meals.  . Oxycodone HCl 10 MG TABS Take 1 tablet (10 mg total) by mouth 2 (two) times daily as needed (Pain).  . Vitamin D, Ergocalciferol, (DRISDOL) 50000 units CAPS capsule Take 1 capsule (50,000 Units total) by mouth every 7 (seven) days. 8 weeks.   No facility-administered encounter medications on file as of 09/22/2018.      Patient Active Problem List   Diagnosis Date Noted  . Need for immunization against influenza 02/28/2018  . Need for 23-polyvalent pneumococcal polysaccharide vaccine 02/28/2018  . Lipoma of left upper extremity 02/28/2018  . Stroke (cerebrum) ([place]) 08/04/2016  . Long-term current use of opiate analgesic 03/05/2016  . Essential hypertension 03/05/2016  . Chronic diastolic (congestive) heart failure ([place]) 01/28/2016   . Protein-calorie malnutrition, severe 01/28/2016  . Healthcare maintenance   . Palliative care by specialist   . Anemia in other chronic diseases classified elsewhere   . Chronic kidney disease (CKD), stage IV (severe) ([place]) 07/06/2015  . Parietal lobe infarction ([place]) 07/04/2015  . Thrombocytopenia ([place])   . Dysarthria   . HLD (hyperlipidemia)   . Chronic obstructive pulmonary disease ([place])   . Hemiparesis, aphasia, and dysphagia as late effect of cerebrovascular accident (CVA) ([place])   . Right hemiplegia ([place])   . OSA on CPAP 12/08/2010  . CAD (coronary artery disease) 12/08/2010  . Controlled diabetes mellitus type 2 with complications ([place]) 53/97/6734     Health Maintenance Due  Topic Date Due  . Hepatitis C Screening  [birth year]  . OPHTHALMOLOGY EXAM  03/03/1961  . URINE MICROALBUMIN  03/03/1961  . TETANUS/TDAP  03/03/1970  . COLONOSCOPY  03/03/2001  . MAMMOGRAM  03/29/2008  . DEXA SCAN  03/03/2016  . INFLUENZA VACCINE  08/27/2018  . HEMOGLOBIN A1C  08/29/2018     Review of Systems 12 point ros is negative except for wound. Physical Exam  No exam due to televisit CBC Lab Results  Component Value Date   WBC 10.6 (H) 08/16/2017   RBC 2.87 (L) 08/16/2017   HGB 10.1 (L) 08/16/2017   HCT 29.5 (L) 08/16/2017   PLT 228 08/16/2017   MCV 102.8 (H) 08/16/2017   MCH 35.2 (H) 08/16/2017   MCHC 34.2 08/16/2017   RDW 14.4 08/16/2017   LYMPHSABS 1.7 08/16/2017   MONOABS 0.6 08/16/2017   EOSABS 0.0 08/16/2017    BMET Lab Results  Component  Value Date   NA 144 08/16/2017   K 4.8 08/16/2017   CL 115 (H) 08/16/2017   CO2 21 (L) 08/16/2017   GLUCOSE 128 (H) 08/16/2017   BUN 58 (H) 08/16/2017   CREATININE 2.90 (H) 09/01/2018   CALCIUM 9.4 08/16/2017   GFRNONAA 17 (L) 08/16/2017   GFRAA 20 (L) 08/16/2017      Assessment and Plan  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 Will need to arrange  for picc line placement, baseline labs, and follow up visit

## 2018-10-04 DIAGNOSIS — N184 Chronic kidney disease, stage 4 (severe): Secondary | ICD-10-CM | POA: Diagnosis not present

## 2018-10-04 DIAGNOSIS — J439 Emphysema, unspecified: Secondary | ICD-10-CM | POA: Diagnosis not present

## 2018-10-04 DIAGNOSIS — M549 Dorsalgia, unspecified: Secondary | ICD-10-CM | POA: Diagnosis not present

## 2018-10-04 DIAGNOSIS — I69320 Aphasia following cerebral infarction: Secondary | ICD-10-CM | POA: Diagnosis not present

## 2018-10-04 DIAGNOSIS — R1310 Dysphagia, unspecified: Secondary | ICD-10-CM | POA: Diagnosis not present

## 2018-10-04 DIAGNOSIS — I252 Old myocardial infarction: Secondary | ICD-10-CM | POA: Diagnosis not present

## 2018-10-04 DIAGNOSIS — I130 Hypertensive heart and chronic kidney disease with heart failure and stage 1 through stage 4 chronic kidney disease, or unspecified chronic kidney disease: Secondary | ICD-10-CM | POA: Diagnosis not present

## 2018-10-04 DIAGNOSIS — E43 Unspecified severe protein-calorie malnutrition: Secondary | ICD-10-CM | POA: Diagnosis not present

## 2018-10-04 DIAGNOSIS — I69391 Dysphagia following cerebral infarction: Secondary | ICD-10-CM | POA: Diagnosis not present

## 2018-10-04 DIAGNOSIS — D631 Anemia in chronic kidney disease: Secondary | ICD-10-CM | POA: Diagnosis not present

## 2018-10-04 DIAGNOSIS — M419 Scoliosis, unspecified: Secondary | ICD-10-CM | POA: Diagnosis not present

## 2018-10-04 DIAGNOSIS — E7800 Pure hypercholesterolemia, unspecified: Secondary | ICD-10-CM | POA: Diagnosis not present

## 2018-10-04 DIAGNOSIS — M1990 Unspecified osteoarthritis, unspecified site: Secondary | ICD-10-CM | POA: Diagnosis not present

## 2018-10-04 DIAGNOSIS — Z79891 Long term (current) use of opiate analgesic: Secondary | ICD-10-CM | POA: Diagnosis not present

## 2018-10-04 DIAGNOSIS — E1122 Type 2 diabetes mellitus with diabetic chronic kidney disease: Secondary | ICD-10-CM | POA: Diagnosis not present

## 2018-10-04 DIAGNOSIS — I69351 Hemiplegia and hemiparesis following cerebral infarction affecting right dominant side: Secondary | ICD-10-CM | POA: Diagnosis not present

## 2018-10-04 DIAGNOSIS — I5032 Chronic diastolic (congestive) heart failure: Secondary | ICD-10-CM | POA: Diagnosis not present

## 2018-10-04 DIAGNOSIS — N139 Obstructive and reflux uropathy, unspecified: Secondary | ICD-10-CM | POA: Diagnosis not present

## 2018-10-04 DIAGNOSIS — G4733 Obstructive sleep apnea (adult) (pediatric): Secondary | ICD-10-CM | POA: Diagnosis not present

## 2018-10-04 DIAGNOSIS — L89626 Pressure-induced deep tissue damage of left heel: Secondary | ICD-10-CM | POA: Diagnosis not present

## 2018-10-04 DIAGNOSIS — I2510 Atherosclerotic heart disease of native coronary artery without angina pectoris: Secondary | ICD-10-CM | POA: Diagnosis not present

## 2018-10-04 DIAGNOSIS — Z466 Encounter for fitting and adjustment of urinary device: Secondary | ICD-10-CM | POA: Diagnosis not present

## 2018-10-04 DIAGNOSIS — G8929 Other chronic pain: Secondary | ICD-10-CM | POA: Diagnosis not present

## 2018-10-04 DIAGNOSIS — L89313 Pressure ulcer of right buttock, stage 3: Secondary | ICD-10-CM | POA: Diagnosis not present

## 2018-10-05 DIAGNOSIS — M1990 Unspecified osteoarthritis, unspecified site: Secondary | ICD-10-CM | POA: Diagnosis not present

## 2018-10-05 DIAGNOSIS — I2510 Atherosclerotic heart disease of native coronary artery without angina pectoris: Secondary | ICD-10-CM | POA: Diagnosis not present

## 2018-10-05 DIAGNOSIS — L89626 Pressure-induced deep tissue damage of left heel: Secondary | ICD-10-CM | POA: Diagnosis not present

## 2018-10-05 DIAGNOSIS — L89313 Pressure ulcer of right buttock, stage 3: Secondary | ICD-10-CM | POA: Diagnosis not present

## 2018-10-05 DIAGNOSIS — I130 Hypertensive heart and chronic kidney disease with heart failure and stage 1 through stage 4 chronic kidney disease, or unspecified chronic kidney disease: Secondary | ICD-10-CM | POA: Diagnosis not present

## 2018-10-05 DIAGNOSIS — D631 Anemia in chronic kidney disease: Secondary | ICD-10-CM | POA: Diagnosis not present

## 2018-10-05 DIAGNOSIS — I69391 Dysphagia following cerebral infarction: Secondary | ICD-10-CM | POA: Diagnosis not present

## 2018-10-05 DIAGNOSIS — I5032 Chronic diastolic (congestive) heart failure: Secondary | ICD-10-CM | POA: Diagnosis not present

## 2018-10-05 DIAGNOSIS — N139 Obstructive and reflux uropathy, unspecified: Secondary | ICD-10-CM | POA: Diagnosis not present

## 2018-10-05 DIAGNOSIS — M419 Scoliosis, unspecified: Secondary | ICD-10-CM | POA: Diagnosis not present

## 2018-10-05 DIAGNOSIS — G8929 Other chronic pain: Secondary | ICD-10-CM | POA: Diagnosis not present

## 2018-10-05 DIAGNOSIS — I252 Old myocardial infarction: Secondary | ICD-10-CM | POA: Diagnosis not present

## 2018-10-05 DIAGNOSIS — I69351 Hemiplegia and hemiparesis following cerebral infarction affecting right dominant side: Secondary | ICD-10-CM | POA: Diagnosis not present

## 2018-10-05 DIAGNOSIS — Z466 Encounter for fitting and adjustment of urinary device: Secondary | ICD-10-CM | POA: Diagnosis not present

## 2018-10-05 DIAGNOSIS — G4733 Obstructive sleep apnea (adult) (pediatric): Secondary | ICD-10-CM | POA: Diagnosis not present

## 2018-10-05 DIAGNOSIS — I69320 Aphasia following cerebral infarction: Secondary | ICD-10-CM | POA: Diagnosis not present

## 2018-10-05 DIAGNOSIS — M549 Dorsalgia, unspecified: Secondary | ICD-10-CM | POA: Diagnosis not present

## 2018-10-05 DIAGNOSIS — J439 Emphysema, unspecified: Secondary | ICD-10-CM | POA: Diagnosis not present

## 2018-10-05 DIAGNOSIS — R1310 Dysphagia, unspecified: Secondary | ICD-10-CM | POA: Diagnosis not present

## 2018-10-05 DIAGNOSIS — N184 Chronic kidney disease, stage 4 (severe): Secondary | ICD-10-CM | POA: Diagnosis not present

## 2018-10-05 DIAGNOSIS — E7800 Pure hypercholesterolemia, unspecified: Secondary | ICD-10-CM | POA: Diagnosis not present

## 2018-10-05 DIAGNOSIS — E43 Unspecified severe protein-calorie malnutrition: Secondary | ICD-10-CM | POA: Diagnosis not present

## 2018-10-05 DIAGNOSIS — Z79891 Long term (current) use of opiate analgesic: Secondary | ICD-10-CM | POA: Diagnosis not present

## 2018-10-05 DIAGNOSIS — E1122 Type 2 diabetes mellitus with diabetic chronic kidney disease: Secondary | ICD-10-CM | POA: Diagnosis not present

## 2018-10-06 ENCOUNTER — Encounter (HOSPITAL_BASED_OUTPATIENT_CLINIC_OR_DEPARTMENT_OTHER): Payer: Medicare Other | Attending: Internal Medicine

## 2018-10-06 DIAGNOSIS — M8668 Other chronic osteomyelitis, other site: Secondary | ICD-10-CM | POA: Insufficient documentation

## 2018-10-06 DIAGNOSIS — I509 Heart failure, unspecified: Secondary | ICD-10-CM | POA: Insufficient documentation

## 2018-10-06 DIAGNOSIS — Z923 Personal history of irradiation: Secondary | ICD-10-CM | POA: Insufficient documentation

## 2018-10-06 DIAGNOSIS — G4730 Sleep apnea, unspecified: Secondary | ICD-10-CM | POA: Insufficient documentation

## 2018-10-06 DIAGNOSIS — L89313 Pressure ulcer of right buttock, stage 3: Secondary | ICD-10-CM | POA: Insufficient documentation

## 2018-10-06 DIAGNOSIS — S20411A Abrasion of right back wall of thorax, initial encounter: Secondary | ICD-10-CM | POA: Insufficient documentation

## 2018-10-06 DIAGNOSIS — Z86718 Personal history of other venous thrombosis and embolism: Secondary | ICD-10-CM | POA: Insufficient documentation

## 2018-10-06 DIAGNOSIS — I252 Old myocardial infarction: Secondary | ICD-10-CM | POA: Insufficient documentation

## 2018-10-06 DIAGNOSIS — J449 Chronic obstructive pulmonary disease, unspecified: Secondary | ICD-10-CM | POA: Insufficient documentation

## 2018-10-06 DIAGNOSIS — Z9221 Personal history of antineoplastic chemotherapy: Secondary | ICD-10-CM | POA: Insufficient documentation

## 2018-10-06 DIAGNOSIS — I110 Hypertensive heart disease with heart failure: Secondary | ICD-10-CM | POA: Insufficient documentation

## 2018-10-06 DIAGNOSIS — X58XXXA Exposure to other specified factors, initial encounter: Secondary | ICD-10-CM | POA: Insufficient documentation

## 2018-10-06 DIAGNOSIS — I2510 Atherosclerotic heart disease of native coronary artery without angina pectoris: Secondary | ICD-10-CM | POA: Insufficient documentation

## 2018-10-06 MED ORDER — OXYCODONE HCL 10 MG PO TABS: 10.0000 mg | ORAL_TABLET | Freq: Two times a day (BID) | ORAL | 0 refills | Status: DC | PRN

## 2018-10-06 NOTE — Telephone Encounter (Signed)
Call from pt's dtr checking on refill status.  Per dtr-pt is scheduled to have a port-a-cath placed to receive IV antibiotics at home to treat her "bone disease".  Pt has sacral osteomyelitis with a wound that requires packing and is painful. Will send refill request to attending MD for review. Please advise.Despina Hidden Cassady9/10/20209:53 AM

## 2018-10-06 NOTE — Telephone Encounter (Signed)
Call made to pt's dtr to inform her that rx was sent to pharmacy.  Dtr wanted to know what to do about getting her mom an appt.  She does not have any acute issues at this time, but was last seen in clinic on 02/28/18 and a tele-health visit on 05/30/18.  Pt about to have port a cath placed and has a sacral wound and she does not want to put pt at risk by coming into the clinic.  Would a telehealth visit be appropriate for now? Please advise.Regenia Skeeter, Majed Pellegrin Cassady9/10/202012:09 PM

## 2018-10-07 DIAGNOSIS — M549 Dorsalgia, unspecified: Secondary | ICD-10-CM | POA: Diagnosis not present

## 2018-10-07 DIAGNOSIS — I69320 Aphasia following cerebral infarction: Secondary | ICD-10-CM | POA: Diagnosis not present

## 2018-10-07 DIAGNOSIS — G4733 Obstructive sleep apnea (adult) (pediatric): Secondary | ICD-10-CM | POA: Diagnosis not present

## 2018-10-07 DIAGNOSIS — E43 Unspecified severe protein-calorie malnutrition: Secondary | ICD-10-CM | POA: Diagnosis not present

## 2018-10-07 DIAGNOSIS — I130 Hypertensive heart and chronic kidney disease with heart failure and stage 1 through stage 4 chronic kidney disease, or unspecified chronic kidney disease: Secondary | ICD-10-CM | POA: Diagnosis not present

## 2018-10-07 DIAGNOSIS — E7800 Pure hypercholesterolemia, unspecified: Secondary | ICD-10-CM | POA: Diagnosis not present

## 2018-10-07 DIAGNOSIS — I5032 Chronic diastolic (congestive) heart failure: Secondary | ICD-10-CM | POA: Diagnosis not present

## 2018-10-07 DIAGNOSIS — J439 Emphysema, unspecified: Secondary | ICD-10-CM | POA: Diagnosis not present

## 2018-10-07 DIAGNOSIS — I69391 Dysphagia following cerebral infarction: Secondary | ICD-10-CM | POA: Diagnosis not present

## 2018-10-07 DIAGNOSIS — M1990 Unspecified osteoarthritis, unspecified site: Secondary | ICD-10-CM | POA: Diagnosis not present

## 2018-10-07 DIAGNOSIS — I2510 Atherosclerotic heart disease of native coronary artery without angina pectoris: Secondary | ICD-10-CM | POA: Diagnosis not present

## 2018-10-07 DIAGNOSIS — D631 Anemia in chronic kidney disease: Secondary | ICD-10-CM | POA: Diagnosis not present

## 2018-10-07 DIAGNOSIS — M419 Scoliosis, unspecified: Secondary | ICD-10-CM | POA: Diagnosis not present

## 2018-10-07 DIAGNOSIS — L89313 Pressure ulcer of right buttock, stage 3: Secondary | ICD-10-CM | POA: Diagnosis not present

## 2018-10-07 DIAGNOSIS — L89626 Pressure-induced deep tissue damage of left heel: Secondary | ICD-10-CM | POA: Diagnosis not present

## 2018-10-07 DIAGNOSIS — I252 Old myocardial infarction: Secondary | ICD-10-CM | POA: Diagnosis not present

## 2018-10-07 DIAGNOSIS — N184 Chronic kidney disease, stage 4 (severe): Secondary | ICD-10-CM | POA: Diagnosis not present

## 2018-10-07 DIAGNOSIS — Z79891 Long term (current) use of opiate analgesic: Secondary | ICD-10-CM | POA: Diagnosis not present

## 2018-10-07 DIAGNOSIS — R1310 Dysphagia, unspecified: Secondary | ICD-10-CM | POA: Diagnosis not present

## 2018-10-07 DIAGNOSIS — E1122 Type 2 diabetes mellitus with diabetic chronic kidney disease: Secondary | ICD-10-CM | POA: Diagnosis not present

## 2018-10-07 DIAGNOSIS — N139 Obstructive and reflux uropathy, unspecified: Secondary | ICD-10-CM | POA: Diagnosis not present

## 2018-10-07 DIAGNOSIS — Z466 Encounter for fitting and adjustment of urinary device: Secondary | ICD-10-CM | POA: Diagnosis not present

## 2018-10-07 DIAGNOSIS — I69351 Hemiplegia and hemiparesis following cerebral infarction affecting right dominant side: Secondary | ICD-10-CM | POA: Diagnosis not present

## 2018-10-07 DIAGNOSIS — G8929 Other chronic pain: Secondary | ICD-10-CM | POA: Diagnosis not present

## 2018-10-10 DIAGNOSIS — L89626 Pressure-induced deep tissue damage of left heel: Secondary | ICD-10-CM | POA: Diagnosis not present

## 2018-10-10 DIAGNOSIS — J439 Emphysema, unspecified: Secondary | ICD-10-CM | POA: Diagnosis not present

## 2018-10-10 DIAGNOSIS — E43 Unspecified severe protein-calorie malnutrition: Secondary | ICD-10-CM | POA: Diagnosis not present

## 2018-10-10 DIAGNOSIS — L89313 Pressure ulcer of right buttock, stage 3: Secondary | ICD-10-CM | POA: Diagnosis not present

## 2018-10-10 DIAGNOSIS — G4733 Obstructive sleep apnea (adult) (pediatric): Secondary | ICD-10-CM | POA: Diagnosis not present

## 2018-10-10 DIAGNOSIS — D631 Anemia in chronic kidney disease: Secondary | ICD-10-CM | POA: Diagnosis not present

## 2018-10-10 DIAGNOSIS — E7800 Pure hypercholesterolemia, unspecified: Secondary | ICD-10-CM | POA: Diagnosis not present

## 2018-10-10 DIAGNOSIS — N184 Chronic kidney disease, stage 4 (severe): Secondary | ICD-10-CM | POA: Diagnosis not present

## 2018-10-10 DIAGNOSIS — I5032 Chronic diastolic (congestive) heart failure: Secondary | ICD-10-CM | POA: Diagnosis not present

## 2018-10-10 DIAGNOSIS — N139 Obstructive and reflux uropathy, unspecified: Secondary | ICD-10-CM | POA: Diagnosis not present

## 2018-10-10 DIAGNOSIS — E1122 Type 2 diabetes mellitus with diabetic chronic kidney disease: Secondary | ICD-10-CM | POA: Diagnosis not present

## 2018-10-10 DIAGNOSIS — I252 Old myocardial infarction: Secondary | ICD-10-CM | POA: Diagnosis not present

## 2018-10-10 DIAGNOSIS — M419 Scoliosis, unspecified: Secondary | ICD-10-CM | POA: Diagnosis not present

## 2018-10-10 DIAGNOSIS — I2510 Atherosclerotic heart disease of native coronary artery without angina pectoris: Secondary | ICD-10-CM | POA: Diagnosis not present

## 2018-10-10 DIAGNOSIS — M1990 Unspecified osteoarthritis, unspecified site: Secondary | ICD-10-CM | POA: Diagnosis not present

## 2018-10-10 DIAGNOSIS — M549 Dorsalgia, unspecified: Secondary | ICD-10-CM | POA: Diagnosis not present

## 2018-10-10 DIAGNOSIS — Z466 Encounter for fitting and adjustment of urinary device: Secondary | ICD-10-CM | POA: Diagnosis not present

## 2018-10-10 DIAGNOSIS — G8929 Other chronic pain: Secondary | ICD-10-CM | POA: Diagnosis not present

## 2018-10-10 DIAGNOSIS — R1310 Dysphagia, unspecified: Secondary | ICD-10-CM | POA: Diagnosis not present

## 2018-10-10 DIAGNOSIS — I69320 Aphasia following cerebral infarction: Secondary | ICD-10-CM | POA: Diagnosis not present

## 2018-10-10 DIAGNOSIS — I130 Hypertensive heart and chronic kidney disease with heart failure and stage 1 through stage 4 chronic kidney disease, or unspecified chronic kidney disease: Secondary | ICD-10-CM | POA: Diagnosis not present

## 2018-10-10 DIAGNOSIS — I69391 Dysphagia following cerebral infarction: Secondary | ICD-10-CM | POA: Diagnosis not present

## 2018-10-10 DIAGNOSIS — Z79891 Long term (current) use of opiate analgesic: Secondary | ICD-10-CM | POA: Diagnosis not present

## 2018-10-10 DIAGNOSIS — I69351 Hemiplegia and hemiparesis following cerebral infarction affecting right dominant side: Secondary | ICD-10-CM | POA: Diagnosis not present

## 2018-10-12 DIAGNOSIS — M419 Scoliosis, unspecified: Secondary | ICD-10-CM | POA: Diagnosis not present

## 2018-10-12 DIAGNOSIS — I252 Old myocardial infarction: Secondary | ICD-10-CM | POA: Diagnosis not present

## 2018-10-12 DIAGNOSIS — E7800 Pure hypercholesterolemia, unspecified: Secondary | ICD-10-CM | POA: Diagnosis not present

## 2018-10-12 DIAGNOSIS — L89313 Pressure ulcer of right buttock, stage 3: Secondary | ICD-10-CM | POA: Diagnosis not present

## 2018-10-12 DIAGNOSIS — J439 Emphysema, unspecified: Secondary | ICD-10-CM | POA: Diagnosis not present

## 2018-10-12 DIAGNOSIS — I5032 Chronic diastolic (congestive) heart failure: Secondary | ICD-10-CM | POA: Diagnosis not present

## 2018-10-12 DIAGNOSIS — E1122 Type 2 diabetes mellitus with diabetic chronic kidney disease: Secondary | ICD-10-CM | POA: Diagnosis not present

## 2018-10-12 DIAGNOSIS — D631 Anemia in chronic kidney disease: Secondary | ICD-10-CM | POA: Diagnosis not present

## 2018-10-12 DIAGNOSIS — G8929 Other chronic pain: Secondary | ICD-10-CM | POA: Diagnosis not present

## 2018-10-12 DIAGNOSIS — Z79891 Long term (current) use of opiate analgesic: Secondary | ICD-10-CM | POA: Diagnosis not present

## 2018-10-12 DIAGNOSIS — I69391 Dysphagia following cerebral infarction: Secondary | ICD-10-CM | POA: Diagnosis not present

## 2018-10-12 DIAGNOSIS — N139 Obstructive and reflux uropathy, unspecified: Secondary | ICD-10-CM | POA: Diagnosis not present

## 2018-10-12 DIAGNOSIS — G4733 Obstructive sleep apnea (adult) (pediatric): Secondary | ICD-10-CM | POA: Diagnosis not present

## 2018-10-12 DIAGNOSIS — M549 Dorsalgia, unspecified: Secondary | ICD-10-CM | POA: Diagnosis not present

## 2018-10-12 DIAGNOSIS — L89626 Pressure-induced deep tissue damage of left heel: Secondary | ICD-10-CM | POA: Diagnosis not present

## 2018-10-12 DIAGNOSIS — R1310 Dysphagia, unspecified: Secondary | ICD-10-CM | POA: Diagnosis not present

## 2018-10-12 DIAGNOSIS — E43 Unspecified severe protein-calorie malnutrition: Secondary | ICD-10-CM | POA: Diagnosis not present

## 2018-10-12 DIAGNOSIS — I130 Hypertensive heart and chronic kidney disease with heart failure and stage 1 through stage 4 chronic kidney disease, or unspecified chronic kidney disease: Secondary | ICD-10-CM | POA: Diagnosis not present

## 2018-10-12 DIAGNOSIS — M1990 Unspecified osteoarthritis, unspecified site: Secondary | ICD-10-CM | POA: Diagnosis not present

## 2018-10-12 DIAGNOSIS — I69351 Hemiplegia and hemiparesis following cerebral infarction affecting right dominant side: Secondary | ICD-10-CM | POA: Diagnosis not present

## 2018-10-12 DIAGNOSIS — N184 Chronic kidney disease, stage 4 (severe): Secondary | ICD-10-CM | POA: Diagnosis not present

## 2018-10-12 DIAGNOSIS — Z466 Encounter for fitting and adjustment of urinary device: Secondary | ICD-10-CM | POA: Diagnosis not present

## 2018-10-12 DIAGNOSIS — I2510 Atherosclerotic heart disease of native coronary artery without angina pectoris: Secondary | ICD-10-CM | POA: Diagnosis not present

## 2018-10-12 DIAGNOSIS — I69320 Aphasia following cerebral infarction: Secondary | ICD-10-CM | POA: Diagnosis not present

## 2018-10-13 ENCOUNTER — Telehealth: Payer: Self-pay | Admitting: *Deleted

## 2018-10-13 NOTE — Telephone Encounter (Signed)
This patient was receiving Urinary Cath Supplies through [facility] as of 06/10/2018. Today we received a Detailed Written Order for these supplies from a Company called [employer]. Community Message sent to Michelle Holmes at [employer] to see if Michelle Holmes is affiliated with [employer] to ensure this is a legitimate DRO. Hubbard Hartshorn, RN, BSN

## 2018-10-14 DIAGNOSIS — L89626 Pressure-induced deep tissue damage of left heel: Secondary | ICD-10-CM | POA: Diagnosis not present

## 2018-10-14 DIAGNOSIS — D631 Anemia in chronic kidney disease: Secondary | ICD-10-CM | POA: Diagnosis not present

## 2018-10-14 DIAGNOSIS — I5032 Chronic diastolic (congestive) heart failure: Secondary | ICD-10-CM | POA: Diagnosis not present

## 2018-10-14 DIAGNOSIS — G8929 Other chronic pain: Secondary | ICD-10-CM | POA: Diagnosis not present

## 2018-10-14 DIAGNOSIS — R1310 Dysphagia, unspecified: Secondary | ICD-10-CM | POA: Diagnosis not present

## 2018-10-14 DIAGNOSIS — E43 Unspecified severe protein-calorie malnutrition: Secondary | ICD-10-CM | POA: Diagnosis not present

## 2018-10-14 DIAGNOSIS — Z466 Encounter for fitting and adjustment of urinary device: Secondary | ICD-10-CM | POA: Diagnosis not present

## 2018-10-14 DIAGNOSIS — N184 Chronic kidney disease, stage 4 (severe): Secondary | ICD-10-CM | POA: Diagnosis not present

## 2018-10-14 DIAGNOSIS — M1990 Unspecified osteoarthritis, unspecified site: Secondary | ICD-10-CM | POA: Diagnosis not present

## 2018-10-14 DIAGNOSIS — J439 Emphysema, unspecified: Secondary | ICD-10-CM | POA: Diagnosis not present

## 2018-10-14 DIAGNOSIS — I69351 Hemiplegia and hemiparesis following cerebral infarction affecting right dominant side: Secondary | ICD-10-CM | POA: Diagnosis not present

## 2018-10-14 DIAGNOSIS — G4733 Obstructive sleep apnea (adult) (pediatric): Secondary | ICD-10-CM | POA: Diagnosis not present

## 2018-10-14 DIAGNOSIS — E1122 Type 2 diabetes mellitus with diabetic chronic kidney disease: Secondary | ICD-10-CM | POA: Diagnosis not present

## 2018-10-14 DIAGNOSIS — I69320 Aphasia following cerebral infarction: Secondary | ICD-10-CM | POA: Diagnosis not present

## 2018-10-14 DIAGNOSIS — M419 Scoliosis, unspecified: Secondary | ICD-10-CM | POA: Diagnosis not present

## 2018-10-14 DIAGNOSIS — I2510 Atherosclerotic heart disease of native coronary artery without angina pectoris: Secondary | ICD-10-CM | POA: Diagnosis not present

## 2018-10-14 DIAGNOSIS — I130 Hypertensive heart and chronic kidney disease with heart failure and stage 1 through stage 4 chronic kidney disease, or unspecified chronic kidney disease: Secondary | ICD-10-CM | POA: Diagnosis not present

## 2018-10-14 DIAGNOSIS — Z79891 Long term (current) use of opiate analgesic: Secondary | ICD-10-CM | POA: Diagnosis not present

## 2018-10-14 DIAGNOSIS — I69391 Dysphagia following cerebral infarction: Secondary | ICD-10-CM | POA: Diagnosis not present

## 2018-10-14 DIAGNOSIS — I252 Old myocardial infarction: Secondary | ICD-10-CM | POA: Diagnosis not present

## 2018-10-14 DIAGNOSIS — M549 Dorsalgia, unspecified: Secondary | ICD-10-CM | POA: Diagnosis not present

## 2018-10-14 DIAGNOSIS — E7800 Pure hypercholesterolemia, unspecified: Secondary | ICD-10-CM | POA: Diagnosis not present

## 2018-10-14 DIAGNOSIS — L89313 Pressure ulcer of right buttock, stage 3: Secondary | ICD-10-CM | POA: Diagnosis not present

## 2018-10-14 DIAGNOSIS — N139 Obstructive and reflux uropathy, unspecified: Secondary | ICD-10-CM | POA: Diagnosis not present

## 2018-10-14 NOTE — Telephone Encounter (Signed)
Catron, Samuel [place]  Catron, Germain Osgood, Orvis Brill, RN; [place], [place]; [place], Mamie C, [place]        I have been advised she only has incontinence supplies with [place]. That team mentioned she may have been getting those supplies thru her home health prior to [place] merging with [employer]. We are not providing urinary cath services to her.

## 2018-10-14 NOTE — Telephone Encounter (Signed)
Glyn Ade, RN with [facility] returned call. States Lattie Haw is [facility] RN who sees patient. Lattie Haw did order new catheter supplies from company that she thinks is [place]. PCP has not met patient yet and has no openings till 11/07/2018. Will need [facility] appt to discuss need for urinary cath supplies. May be done telehealth. Hubbard Hartshorn, RN, BSN

## 2018-10-14 NOTE — Telephone Encounter (Signed)
Call placed to daughter to learn if they are receiving cath supplies through [facility]. Daughter is unsure. States [facility] orders these supplies for them.  Call placed to [facility] at [phone]. No answer and VM has not been set up. Call placed to Glyn Ade, [facility] Rep. Left VM requesting return call. Hubbard Hartshorn, RN, BSN

## 2018-10-17 DIAGNOSIS — I5032 Chronic diastolic (congestive) heart failure: Secondary | ICD-10-CM | POA: Diagnosis not present

## 2018-10-17 DIAGNOSIS — E1122 Type 2 diabetes mellitus with diabetic chronic kidney disease: Secondary | ICD-10-CM | POA: Diagnosis not present

## 2018-10-17 DIAGNOSIS — M1990 Unspecified osteoarthritis, unspecified site: Secondary | ICD-10-CM | POA: Diagnosis not present

## 2018-10-17 DIAGNOSIS — M419 Scoliosis, unspecified: Secondary | ICD-10-CM | POA: Diagnosis not present

## 2018-10-17 DIAGNOSIS — I69391 Dysphagia following cerebral infarction: Secondary | ICD-10-CM | POA: Diagnosis not present

## 2018-10-17 DIAGNOSIS — N139 Obstructive and reflux uropathy, unspecified: Secondary | ICD-10-CM | POA: Diagnosis not present

## 2018-10-17 DIAGNOSIS — I130 Hypertensive heart and chronic kidney disease with heart failure and stage 1 through stage 4 chronic kidney disease, or unspecified chronic kidney disease: Secondary | ICD-10-CM | POA: Diagnosis not present

## 2018-10-17 DIAGNOSIS — I2510 Atherosclerotic heart disease of native coronary artery without angina pectoris: Secondary | ICD-10-CM | POA: Diagnosis not present

## 2018-10-17 DIAGNOSIS — L89626 Pressure-induced deep tissue damage of left heel: Secondary | ICD-10-CM | POA: Diagnosis not present

## 2018-10-17 DIAGNOSIS — N184 Chronic kidney disease, stage 4 (severe): Secondary | ICD-10-CM | POA: Diagnosis not present

## 2018-10-17 DIAGNOSIS — E43 Unspecified severe protein-calorie malnutrition: Secondary | ICD-10-CM | POA: Diagnosis not present

## 2018-10-17 DIAGNOSIS — Z466 Encounter for fitting and adjustment of urinary device: Secondary | ICD-10-CM | POA: Diagnosis not present

## 2018-10-17 DIAGNOSIS — L89313 Pressure ulcer of right buttock, stage 3: Secondary | ICD-10-CM | POA: Diagnosis not present

## 2018-10-17 DIAGNOSIS — I69351 Hemiplegia and hemiparesis following cerebral infarction affecting right dominant side: Secondary | ICD-10-CM | POA: Diagnosis not present

## 2018-10-17 DIAGNOSIS — G4733 Obstructive sleep apnea (adult) (pediatric): Secondary | ICD-10-CM | POA: Diagnosis not present

## 2018-10-17 DIAGNOSIS — M549 Dorsalgia, unspecified: Secondary | ICD-10-CM | POA: Diagnosis not present

## 2018-10-17 DIAGNOSIS — J439 Emphysema, unspecified: Secondary | ICD-10-CM | POA: Diagnosis not present

## 2018-10-17 DIAGNOSIS — E7800 Pure hypercholesterolemia, unspecified: Secondary | ICD-10-CM | POA: Diagnosis not present

## 2018-10-17 DIAGNOSIS — I252 Old myocardial infarction: Secondary | ICD-10-CM | POA: Diagnosis not present

## 2018-10-17 DIAGNOSIS — Z79891 Long term (current) use of opiate analgesic: Secondary | ICD-10-CM | POA: Diagnosis not present

## 2018-10-17 DIAGNOSIS — D631 Anemia in chronic kidney disease: Secondary | ICD-10-CM | POA: Diagnosis not present

## 2018-10-17 DIAGNOSIS — I69320 Aphasia following cerebral infarction: Secondary | ICD-10-CM | POA: Diagnosis not present

## 2018-10-17 DIAGNOSIS — R1310 Dysphagia, unspecified: Secondary | ICD-10-CM | POA: Diagnosis not present

## 2018-10-17 DIAGNOSIS — G8929 Other chronic pain: Secondary | ICD-10-CM | POA: Diagnosis not present

## 2018-10-18 ENCOUNTER — Ambulatory Visit: Payer: Medicare Other | Admitting: Internal Medicine

## 2018-10-18 ENCOUNTER — Other Ambulatory Visit: Payer: Self-pay

## 2018-10-18 DIAGNOSIS — R338 Other retention of urine: Secondary | ICD-10-CM

## 2018-10-18 DIAGNOSIS — R1030 Lower abdominal pain, unspecified: Secondary | ICD-10-CM

## 2018-10-18 NOTE — Assessment & Plan Note (Addendum)
Patient continues to have a need for Foley catheter, due to chronic urinary retention. Catheter is changed monthly.  - continue catheter care - DME orders placed for urinary cath supplies

## 2018-10-18 NOTE — Progress Notes (Signed)
  A [facility] Internal Medicine Residency Telephone Encounter Continuity Care Appointment  HPI:   This telephone encounter was created for [name] on 10/18/2018 for the following purpose/cc abdominal pain and urinary catheter supplies.   Past Medical History:  Past Medical History:  Diagnosis Date  . Anemia   . CHF (congestive heart failure) ([place])   . Chronic back pain   . COPD (chronic obstructive pulmonary disease) ([place])   . Coronary artery disease   . Diabetes mellitus   . DJD (degenerative joint disease)   . Emphysema   . Hypercholesteremia   . Hypertension   . Myocardial infarction ([place])   . Obesity   . Obesity hypoventilation syndrome ([place])   . Renal insufficiency   . Scoliosis   . Sleep apnea   . Stroke ([place])   . TIA (transient ischemic attack)       ROS:  Review of Systems  Constitutional: Negative for chills and fever.  Gastrointestinal: Positive for abdominal pain. Negative for constipation, diarrhea, nausea and vomiting.  Genitourinary: Negative for dysuria, flank pain, frequency, hematuria and urgency.     Assessment / Plan / Recommendations:   Please see A&P under problem oriented charting for assessment of the patient's acute and chronic medical conditions.   As always, pt is advised that if symptoms worsen or new symptoms arise, they should go to an urgent care facility or to to ER for further evaluation.   Consent and Medical Decision Making:   Patient discussed with Dr. Lynnae January  This is a telephone encounter between [name] and Ladona Horns on 10/18/2018 for Urinary Cath Supplies. The visit was conducted with the patient located at home and Ladona Horns at [facility]. The patient's identity was confirmed using their DOB and current address. The patient and her daughter, [name], consented to being evaluated through a telephone encounter and understands the associated risks (an examination cannot be done and the patient may need to come in for an  appointment) / benefits (allows the patient to remain at home, decreasing exposure to coronavirus). I personally spent 9 minutes on medical discussion.

## 2018-10-18 NOTE — Assessment & Plan Note (Addendum)
Pt reports abdominal pain for 3 days. She has a indwelling Foley catheter. Denies fevers, chills, nausea, constipation, or flank pain. Daughter states she had a urinary tract infection earlier this year that presented similarly with only abdominal pain. UA from 04/21/2018 significant for 3+ WBC esterase, negative nitrites, and many bacteria.   Assessment - possible complicated UTI with indwelling catheter vs urinary colonization High likelihood for bladder colonization due to indwelling catheter placement for over a year now. Would be more likely to treat for infection with pt endorsing symptoms today (abdominal pain).  Plan - UA and urine culture ordered from pt's [facility] nurse to collect this week - will hold off antibiotic treatment for now pending lab results

## 2018-10-18 NOTE — Telephone Encounter (Signed)
Telehealth visit would be fine if the patient does not have any acute issues and wants to follow up. Let me know if there is anything else I need to do to help facilitate. Thanks!

## 2018-10-19 DIAGNOSIS — N139 Obstructive and reflux uropathy, unspecified: Secondary | ICD-10-CM | POA: Diagnosis not present

## 2018-10-19 DIAGNOSIS — D631 Anemia in chronic kidney disease: Secondary | ICD-10-CM | POA: Diagnosis not present

## 2018-10-19 DIAGNOSIS — M549 Dorsalgia, unspecified: Secondary | ICD-10-CM | POA: Diagnosis not present

## 2018-10-19 DIAGNOSIS — L89313 Pressure ulcer of right buttock, stage 3: Secondary | ICD-10-CM | POA: Diagnosis not present

## 2018-10-19 DIAGNOSIS — I2510 Atherosclerotic heart disease of native coronary artery without angina pectoris: Secondary | ICD-10-CM | POA: Diagnosis not present

## 2018-10-19 DIAGNOSIS — I252 Old myocardial infarction: Secondary | ICD-10-CM | POA: Diagnosis not present

## 2018-10-19 DIAGNOSIS — E7800 Pure hypercholesterolemia, unspecified: Secondary | ICD-10-CM | POA: Diagnosis not present

## 2018-10-19 DIAGNOSIS — I69320 Aphasia following cerebral infarction: Secondary | ICD-10-CM | POA: Diagnosis not present

## 2018-10-19 DIAGNOSIS — M419 Scoliosis, unspecified: Secondary | ICD-10-CM | POA: Diagnosis not present

## 2018-10-19 DIAGNOSIS — Z466 Encounter for fitting and adjustment of urinary device: Secondary | ICD-10-CM | POA: Diagnosis not present

## 2018-10-19 DIAGNOSIS — L89626 Pressure-induced deep tissue damage of left heel: Secondary | ICD-10-CM | POA: Diagnosis not present

## 2018-10-19 DIAGNOSIS — N184 Chronic kidney disease, stage 4 (severe): Secondary | ICD-10-CM | POA: Diagnosis not present

## 2018-10-19 DIAGNOSIS — I130 Hypertensive heart and chronic kidney disease with heart failure and stage 1 through stage 4 chronic kidney disease, or unspecified chronic kidney disease: Secondary | ICD-10-CM | POA: Diagnosis not present

## 2018-10-19 DIAGNOSIS — I69351 Hemiplegia and hemiparesis following cerebral infarction affecting right dominant side: Secondary | ICD-10-CM | POA: Diagnosis not present

## 2018-10-19 DIAGNOSIS — G4733 Obstructive sleep apnea (adult) (pediatric): Secondary | ICD-10-CM | POA: Diagnosis not present

## 2018-10-19 DIAGNOSIS — Z79891 Long term (current) use of opiate analgesic: Secondary | ICD-10-CM | POA: Diagnosis not present

## 2018-10-19 DIAGNOSIS — R1310 Dysphagia, unspecified: Secondary | ICD-10-CM | POA: Diagnosis not present

## 2018-10-19 DIAGNOSIS — G8929 Other chronic pain: Secondary | ICD-10-CM | POA: Diagnosis not present

## 2018-10-19 DIAGNOSIS — E1122 Type 2 diabetes mellitus with diabetic chronic kidney disease: Secondary | ICD-10-CM | POA: Diagnosis not present

## 2018-10-19 DIAGNOSIS — E43 Unspecified severe protein-calorie malnutrition: Secondary | ICD-10-CM | POA: Diagnosis not present

## 2018-10-19 DIAGNOSIS — I5032 Chronic diastolic (congestive) heart failure: Secondary | ICD-10-CM | POA: Diagnosis not present

## 2018-10-19 DIAGNOSIS — M1990 Unspecified osteoarthritis, unspecified site: Secondary | ICD-10-CM | POA: Diagnosis not present

## 2018-10-19 DIAGNOSIS — J439 Emphysema, unspecified: Secondary | ICD-10-CM | POA: Diagnosis not present

## 2018-10-19 DIAGNOSIS — I69391 Dysphagia following cerebral infarction: Secondary | ICD-10-CM | POA: Diagnosis not present

## 2018-10-19 NOTE — Progress Notes (Signed)
Internal Medicine Clinic Attending  Case discussed with Dr. Jones at the time of the visit.  We reviewed the resident's history and exam and pertinent patient test results.  I agree with the assessment, diagnosis, and plan of care documented in the resident's note.  

## 2018-10-20 ENCOUNTER — Other Ambulatory Visit: Payer: Self-pay

## 2018-10-20 ENCOUNTER — Ambulatory Visit (INDEPENDENT_AMBULATORY_CARE_PROVIDER_SITE_OTHER): Payer: Medicare Other | Admitting: Internal Medicine

## 2018-10-20 DIAGNOSIS — X58XXXA Exposure to other specified factors, initial encounter: Secondary | ICD-10-CM | POA: Diagnosis not present

## 2018-10-20 DIAGNOSIS — M4628 Osteomyelitis of vertebra, sacral and sacrococcygeal region: Secondary | ICD-10-CM | POA: Diagnosis not present

## 2018-10-20 DIAGNOSIS — I509 Heart failure, unspecified: Secondary | ICD-10-CM | POA: Diagnosis not present

## 2018-10-20 DIAGNOSIS — Z923 Personal history of irradiation: Secondary | ICD-10-CM | POA: Diagnosis not present

## 2018-10-20 DIAGNOSIS — G4730 Sleep apnea, unspecified: Secondary | ICD-10-CM | POA: Diagnosis not present

## 2018-10-20 DIAGNOSIS — S20411A Abrasion of right back wall of thorax, initial encounter: Secondary | ICD-10-CM | POA: Diagnosis not present

## 2018-10-20 DIAGNOSIS — S31000A Unspecified open wound of lower back and pelvis without penetration into retroperitoneum, initial encounter: Secondary | ICD-10-CM | POA: Diagnosis not present

## 2018-10-20 DIAGNOSIS — L89319 Pressure ulcer of right buttock, unspecified stage: Secondary | ICD-10-CM | POA: Diagnosis not present

## 2018-10-20 DIAGNOSIS — J449 Chronic obstructive pulmonary disease, unspecified: Secondary | ICD-10-CM | POA: Diagnosis not present

## 2018-10-20 DIAGNOSIS — I252 Old myocardial infarction: Secondary | ICD-10-CM | POA: Diagnosis not present

## 2018-10-20 DIAGNOSIS — L89313 Pressure ulcer of right buttock, stage 3: Secondary | ICD-10-CM | POA: Diagnosis not present

## 2018-10-20 DIAGNOSIS — I2510 Atherosclerotic heart disease of native coronary artery without angina pectoris: Secondary | ICD-10-CM | POA: Diagnosis not present

## 2018-10-20 DIAGNOSIS — Z9221 Personal history of antineoplastic chemotherapy: Secondary | ICD-10-CM | POA: Diagnosis not present

## 2018-10-20 DIAGNOSIS — Z86718 Personal history of other venous thrombosis and embolism: Secondary | ICD-10-CM | POA: Diagnosis not present

## 2018-10-20 DIAGNOSIS — I110 Hypertensive heart disease with heart failure: Secondary | ICD-10-CM | POA: Diagnosis not present

## 2018-10-20 DIAGNOSIS — M8668 Other chronic osteomyelitis, other site: Secondary | ICD-10-CM | POA: Diagnosis not present

## 2018-10-20 NOTE — Progress Notes (Signed)
RFV: televisit follow up for sacral osteomyelitis, patient is predominantly bedbound. Consented to telephone visit, she was reached at home through office line  Patient ID: Michelle Holmes, female   DOB: 07/09/1950, 68 y.o.   MRN: [id number]  HPI Michelle Holmes is a 68yo F with history of stroke, CKD 4, bedbound, sacral wound that is likely now chronic sacral wound per her wound care team. She was referred to ID clinic for management of sacral osteomyelitis. Since we last spoke a month ago, she states that drainage is about the same. No fever, chills, has not had recent treatment for sacral osteomyelitis, but intermittent courses of oral abtx.  Outpatient Encounter Medications as of 10/20/2018  Medication Sig  . acetaminophen (TYLENOL) 500 MG tablet Take 500 mg by mouth every 6 (six) hours as needed for moderate pain.  Marland Kitchen amLODipine (NORVASC) 5 MG tablet Take 1 tablet (5 mg total) by mouth daily.  . collagenase (SANTYL) ointment Apply 1 application topically daily.  . Diapers & Supplies MISC Please provide patient with adult diapers and pads  . feeding supplement, ENSURE COMPLETE, (ENSURE COMPLETE) LIQD Take 237 mLs by mouth 2 (two) times daily between meals.  . Oxycodone HCl 10 MG TABS Take 1 tablet (10 mg total) by mouth 2 (two) times daily as needed (Pain).  . Vitamin D, Ergocalciferol, (DRISDOL) 50000 units CAPS capsule Take 1 capsule (50,000 Units total) by mouth every 7 (seven) days. 8 weeks.   No facility-administered encounter medications on file as of 10/20/2018.      Patient Active Problem List   Diagnosis Date Noted  . Need for immunization against influenza 02/28/2018  . Need for 23-polyvalent pneumococcal polysaccharide vaccine 02/28/2018  . Lipoma of left upper extremity 02/28/2018  . Stroke (cerebrum) ([place]) 08/04/2016  . Long-term current use of opiate analgesic 03/05/2016  . Essential hypertension 03/05/2016  . Chronic diastolic (congestive) heart failure ([place]) 01/28/2016  .  Protein-calorie malnutrition, severe 01/28/2016  . Healthcare maintenance   . Palliative care by specialist   . Anemia in other chronic diseases classified elsewhere   . Acute urinary retention   . Chronic kidney disease (CKD), stage IV (severe) ([place]) 07/06/2015  . Parietal lobe infarction ([place]) 07/04/2015  . Thrombocytopenia ([place])   . Dysarthria   . HLD (hyperlipidemia)   . Chronic obstructive pulmonary disease ([place])   . Hemiparesis, aphasia, and dysphagia as late effect of cerebrovascular accident (CVA) ([place])   . Right hemiplegia ([place])   . Lower abdominal pain 06/20/2014  . OSA on CPAP 12/08/2010  . CAD (coronary artery disease) 12/08/2010  . Controlled diabetes mellitus type 2 with complications ([place]) 95/63/8756     Health Maintenance Due  Topic Date Due  . Hepatitis C Screening  01-11-1951  . OPHTHALMOLOGY EXAM  03/03/1961  . URINE MICROALBUMIN  03/03/1961  . TETANUS/TDAP  03/03/1970  . COLONOSCOPY  03/03/2001  . MAMMOGRAM  03/29/2008  . DEXA SCAN  03/03/2016  . INFLUENZA VACCINE  08/27/2018  . HEMOGLOBIN A1C  08/29/2018     Review of Systems 12 point ros is negative except, deconditioning, since bedbound from hemiplegia associated with hx of cva. Sacral wound.  Physical Exam   There were no vitals taken for this visit.  No exam, since televisit CBC Lab Results  Component Value Date   WBC 10.6 (H) 08/16/2017   RBC 2.87 (L) 08/16/2017   HGB 10.1 (L) 08/16/2017   HCT 29.5 (L) 08/16/2017   PLT 228 08/16/2017   MCV 102.8 (  H) 08/16/2017   MCH 35.2 (H) 08/16/2017   MCHC 34.2 08/16/2017   RDW 14.4 08/16/2017   LYMPHSABS 1.7 08/16/2017   MONOABS 0.6 08/16/2017   EOSABS 0.0 08/16/2017    BMET Lab Results  Component Value Date   NA 144 08/16/2017   K 4.8 08/16/2017   CL 115 (H) 08/16/2017   CO2 21 (L) 08/16/2017   GLUCOSE 128 (H) 08/16/2017   BUN 58 (H) 08/16/2017   CREATININE 2.90 (H) 09/01/2018   CALCIUM 9.4 08/16/2017   GFRNONAA 17 (L) 08/16/2017    GFRAA 20 (L) 08/16/2017      Assessment and Plan  Sacral osteomyelitis = will Need ir to place [place]/IJ line - due to CKD 4 Need repeat labs sed rate, crp, ck, bmp,cbc with diff  Will arrange for ceftaroline x 6 wk then convert to oral abtx (for mrsa and gnr coverage, unable to get daptomycin dosing, since no recent weight estimate)  Spent 15 min with patient regarding sacral osteomyelitis and coordination of care

## 2018-10-20 NOTE — Telephone Encounter (Signed)
[place] office has faxed completed and signed DRO for catheter supplies to [facility]. Hubbard Hartshorn, RN, BSN

## 2018-10-21 ENCOUNTER — Telehealth: Payer: Self-pay | Admitting: *Deleted

## 2018-10-21 DIAGNOSIS — Z79891 Long term (current) use of opiate analgesic: Secondary | ICD-10-CM | POA: Diagnosis not present

## 2018-10-21 DIAGNOSIS — G8929 Other chronic pain: Secondary | ICD-10-CM | POA: Diagnosis not present

## 2018-10-21 DIAGNOSIS — I252 Old myocardial infarction: Secondary | ICD-10-CM | POA: Diagnosis not present

## 2018-10-21 DIAGNOSIS — R1310 Dysphagia, unspecified: Secondary | ICD-10-CM | POA: Diagnosis not present

## 2018-10-21 DIAGNOSIS — M549 Dorsalgia, unspecified: Secondary | ICD-10-CM | POA: Diagnosis not present

## 2018-10-21 DIAGNOSIS — M419 Scoliosis, unspecified: Secondary | ICD-10-CM | POA: Diagnosis not present

## 2018-10-21 DIAGNOSIS — J439 Emphysema, unspecified: Secondary | ICD-10-CM | POA: Diagnosis not present

## 2018-10-21 DIAGNOSIS — N139 Obstructive and reflux uropathy, unspecified: Secondary | ICD-10-CM | POA: Diagnosis not present

## 2018-10-21 DIAGNOSIS — Z466 Encounter for fitting and adjustment of urinary device: Secondary | ICD-10-CM | POA: Diagnosis not present

## 2018-10-21 DIAGNOSIS — I2510 Atherosclerotic heart disease of native coronary artery without angina pectoris: Secondary | ICD-10-CM | POA: Diagnosis not present

## 2018-10-21 DIAGNOSIS — L89626 Pressure-induced deep tissue damage of left heel: Secondary | ICD-10-CM | POA: Diagnosis not present

## 2018-10-21 DIAGNOSIS — I69320 Aphasia following cerebral infarction: Secondary | ICD-10-CM | POA: Diagnosis not present

## 2018-10-21 DIAGNOSIS — N184 Chronic kidney disease, stage 4 (severe): Secondary | ICD-10-CM | POA: Diagnosis not present

## 2018-10-21 DIAGNOSIS — I69351 Hemiplegia and hemiparesis following cerebral infarction affecting right dominant side: Secondary | ICD-10-CM | POA: Diagnosis not present

## 2018-10-21 DIAGNOSIS — N390 Urinary tract infection, site not specified: Secondary | ICD-10-CM | POA: Diagnosis not present

## 2018-10-21 DIAGNOSIS — G4733 Obstructive sleep apnea (adult) (pediatric): Secondary | ICD-10-CM | POA: Diagnosis not present

## 2018-10-21 DIAGNOSIS — M1990 Unspecified osteoarthritis, unspecified site: Secondary | ICD-10-CM | POA: Diagnosis not present

## 2018-10-21 DIAGNOSIS — E7800 Pure hypercholesterolemia, unspecified: Secondary | ICD-10-CM | POA: Diagnosis not present

## 2018-10-21 DIAGNOSIS — D631 Anemia in chronic kidney disease: Secondary | ICD-10-CM | POA: Diagnosis not present

## 2018-10-21 DIAGNOSIS — I69391 Dysphagia following cerebral infarction: Secondary | ICD-10-CM | POA: Diagnosis not present

## 2018-10-21 DIAGNOSIS — L89313 Pressure ulcer of right buttock, stage 3: Secondary | ICD-10-CM | POA: Diagnosis not present

## 2018-10-21 DIAGNOSIS — I130 Hypertensive heart and chronic kidney disease with heart failure and stage 1 through stage 4 chronic kidney disease, or unspecified chronic kidney disease: Secondary | ICD-10-CM | POA: Diagnosis not present

## 2018-10-21 DIAGNOSIS — I5032 Chronic diastolic (congestive) heart failure: Secondary | ICD-10-CM | POA: Diagnosis not present

## 2018-10-21 DIAGNOSIS — E1122 Type 2 diabetes mellitus with diabetic chronic kidney disease: Secondary | ICD-10-CM | POA: Diagnosis not present

## 2018-10-21 DIAGNOSIS — E43 Unspecified severe protein-calorie malnutrition: Secondary | ICD-10-CM | POA: Diagnosis not present

## 2018-10-21 NOTE — Telephone Encounter (Signed)
Lattie Haw, RN with [facility] called requesting VO for U/A and urine culture. This was to be done per Dr Ronnald Ramp' note on 10/18/2018. VO given. Lattie Haw also notified that DRO for catheter supplies was faxed yesterday to [employer] as requested. Hubbard Hartshorn, BSN, RN-BC

## 2018-10-21 NOTE — Telephone Encounter (Signed)
RN spoke with patient's home health nurse Lattie Haw at [facility] (office [phone], office fax [phone], cell [phone]).  She currently sees the patient 3 times per week, is able to provide infusion and PICC services.  Per Lattie Haw, there is no way to weigh the patient at home.  RN left message with Anderson Malta and Caryl Pina at [facility] asking 1) to confirm central line order and 2) request bariatric bed for accurate patient weight when they are placing the line.  [facility], [facility], and [facility] will all need this weight for medication dosing. Written orders are in Triage, waiting on PICC placement appointment, weight for medication authorization.  RN did not receive confirmation call back from IR at this time, will forward to [facility] triage to follow on Monday.  Per Lattie Haw, patient will need 24+ hour notice for any appointment to coordinate transportation.  Her daughter's phone number is listed as the main contact number. RN updated her on the plans as is. They are best able to get to appointments around 10-11. Landis Gandy, RN

## 2018-10-23 DIAGNOSIS — J449 Chronic obstructive pulmonary disease, unspecified: Secondary | ICD-10-CM | POA: Diagnosis not present

## 2018-10-24 ENCOUNTER — Telehealth: Payer: Self-pay

## 2018-10-24 DIAGNOSIS — N139 Obstructive and reflux uropathy, unspecified: Secondary | ICD-10-CM | POA: Diagnosis not present

## 2018-10-24 DIAGNOSIS — I252 Old myocardial infarction: Secondary | ICD-10-CM | POA: Diagnosis not present

## 2018-10-24 DIAGNOSIS — L89313 Pressure ulcer of right buttock, stage 3: Secondary | ICD-10-CM | POA: Diagnosis not present

## 2018-10-24 DIAGNOSIS — I69351 Hemiplegia and hemiparesis following cerebral infarction affecting right dominant side: Secondary | ICD-10-CM | POA: Diagnosis not present

## 2018-10-24 DIAGNOSIS — Z87440 Personal history of urinary (tract) infections: Secondary | ICD-10-CM | POA: Diagnosis not present

## 2018-10-24 DIAGNOSIS — D631 Anemia in chronic kidney disease: Secondary | ICD-10-CM | POA: Diagnosis not present

## 2018-10-24 DIAGNOSIS — N184 Chronic kidney disease, stage 4 (severe): Secondary | ICD-10-CM | POA: Diagnosis not present

## 2018-10-24 DIAGNOSIS — I69320 Aphasia following cerebral infarction: Secondary | ICD-10-CM | POA: Diagnosis not present

## 2018-10-24 DIAGNOSIS — M549 Dorsalgia, unspecified: Secondary | ICD-10-CM | POA: Diagnosis not present

## 2018-10-24 DIAGNOSIS — I2510 Atherosclerotic heart disease of native coronary artery without angina pectoris: Secondary | ICD-10-CM | POA: Diagnosis not present

## 2018-10-24 DIAGNOSIS — Z466 Encounter for fitting and adjustment of urinary device: Secondary | ICD-10-CM | POA: Diagnosis not present

## 2018-10-24 DIAGNOSIS — I69391 Dysphagia following cerebral infarction: Secondary | ICD-10-CM | POA: Diagnosis not present

## 2018-10-24 DIAGNOSIS — I5032 Chronic diastolic (congestive) heart failure: Secondary | ICD-10-CM | POA: Diagnosis not present

## 2018-10-24 DIAGNOSIS — E1122 Type 2 diabetes mellitus with diabetic chronic kidney disease: Secondary | ICD-10-CM | POA: Diagnosis not present

## 2018-10-24 DIAGNOSIS — E7800 Pure hypercholesterolemia, unspecified: Secondary | ICD-10-CM | POA: Diagnosis not present

## 2018-10-24 DIAGNOSIS — Z7401 Bed confinement status: Secondary | ICD-10-CM | POA: Diagnosis not present

## 2018-10-24 DIAGNOSIS — M1990 Unspecified osteoarthritis, unspecified site: Secondary | ICD-10-CM | POA: Diagnosis not present

## 2018-10-24 DIAGNOSIS — J439 Emphysema, unspecified: Secondary | ICD-10-CM | POA: Diagnosis not present

## 2018-10-24 DIAGNOSIS — M419 Scoliosis, unspecified: Secondary | ICD-10-CM | POA: Diagnosis not present

## 2018-10-24 DIAGNOSIS — G4733 Obstructive sleep apnea (adult) (pediatric): Secondary | ICD-10-CM | POA: Diagnosis not present

## 2018-10-24 DIAGNOSIS — G8929 Other chronic pain: Secondary | ICD-10-CM | POA: Diagnosis not present

## 2018-10-24 DIAGNOSIS — I130 Hypertensive heart and chronic kidney disease with heart failure and stage 1 through stage 4 chronic kidney disease, or unspecified chronic kidney disease: Secondary | ICD-10-CM | POA: Diagnosis not present

## 2018-10-24 DIAGNOSIS — R1310 Dysphagia, unspecified: Secondary | ICD-10-CM | POA: Diagnosis not present

## 2018-10-24 DIAGNOSIS — E43 Unspecified severe protein-calorie malnutrition: Secondary | ICD-10-CM | POA: Diagnosis not present

## 2018-10-24 NOTE — Telephone Encounter (Signed)
Spoke with Caryl Pina in [employer]. Central line orders confirmed. IR will return call to triage to confirm if bariatric bed available to obtain patient's weight for medication dosing. Currently awaiting return call [facility]

## 2018-10-24 NOTE — Telephone Encounter (Signed)
LPN called and spoke with Michelle Holmes and made aware of new patient appointment with [facility] on 10/27/18 at 9:00am.  Referral faxed to [facility] with orders.  Faxed orders to [facility] Stay.  Orders per Dr. Baxter Flattery 1)D/C Damtomycin  2) Ceftaroline 200mg  Q12 hours IV  x 6 weeks - Weekly BMP, CBC, sed rate, CRP, CK Weekly Dressing Changes to central Line Wound Care management to continue [facility]

## 2018-10-26 ENCOUNTER — Other Ambulatory Visit (HOSPITAL_COMMUNITY): Payer: Self-pay

## 2018-10-26 DIAGNOSIS — Z7401 Bed confinement status: Secondary | ICD-10-CM | POA: Diagnosis not present

## 2018-10-26 DIAGNOSIS — G8929 Other chronic pain: Secondary | ICD-10-CM | POA: Diagnosis not present

## 2018-10-26 DIAGNOSIS — J439 Emphysema, unspecified: Secondary | ICD-10-CM | POA: Diagnosis not present

## 2018-10-26 DIAGNOSIS — I69320 Aphasia following cerebral infarction: Secondary | ICD-10-CM | POA: Diagnosis not present

## 2018-10-26 DIAGNOSIS — G4733 Obstructive sleep apnea (adult) (pediatric): Secondary | ICD-10-CM | POA: Diagnosis not present

## 2018-10-26 DIAGNOSIS — M419 Scoliosis, unspecified: Secondary | ICD-10-CM | POA: Diagnosis not present

## 2018-10-26 DIAGNOSIS — I69351 Hemiplegia and hemiparesis following cerebral infarction affecting right dominant side: Secondary | ICD-10-CM | POA: Diagnosis not present

## 2018-10-26 DIAGNOSIS — Z466 Encounter for fitting and adjustment of urinary device: Secondary | ICD-10-CM | POA: Diagnosis not present

## 2018-10-26 DIAGNOSIS — M549 Dorsalgia, unspecified: Secondary | ICD-10-CM | POA: Diagnosis not present

## 2018-10-26 DIAGNOSIS — N139 Obstructive and reflux uropathy, unspecified: Secondary | ICD-10-CM | POA: Diagnosis not present

## 2018-10-26 DIAGNOSIS — M1990 Unspecified osteoarthritis, unspecified site: Secondary | ICD-10-CM | POA: Diagnosis not present

## 2018-10-26 DIAGNOSIS — I69391 Dysphagia following cerebral infarction: Secondary | ICD-10-CM | POA: Diagnosis not present

## 2018-10-26 DIAGNOSIS — I130 Hypertensive heart and chronic kidney disease with heart failure and stage 1 through stage 4 chronic kidney disease, or unspecified chronic kidney disease: Secondary | ICD-10-CM | POA: Diagnosis not present

## 2018-10-26 DIAGNOSIS — N184 Chronic kidney disease, stage 4 (severe): Secondary | ICD-10-CM | POA: Diagnosis not present

## 2018-10-26 DIAGNOSIS — E7800 Pure hypercholesterolemia, unspecified: Secondary | ICD-10-CM | POA: Diagnosis not present

## 2018-10-26 DIAGNOSIS — Z87440 Personal history of urinary (tract) infections: Secondary | ICD-10-CM | POA: Diagnosis not present

## 2018-10-26 DIAGNOSIS — L89313 Pressure ulcer of right buttock, stage 3: Secondary | ICD-10-CM | POA: Diagnosis not present

## 2018-10-26 DIAGNOSIS — E1122 Type 2 diabetes mellitus with diabetic chronic kidney disease: Secondary | ICD-10-CM | POA: Diagnosis not present

## 2018-10-26 DIAGNOSIS — R1310 Dysphagia, unspecified: Secondary | ICD-10-CM | POA: Diagnosis not present

## 2018-10-26 DIAGNOSIS — I252 Old myocardial infarction: Secondary | ICD-10-CM | POA: Diagnosis not present

## 2018-10-26 DIAGNOSIS — E43 Unspecified severe protein-calorie malnutrition: Secondary | ICD-10-CM | POA: Diagnosis not present

## 2018-10-26 DIAGNOSIS — I2510 Atherosclerotic heart disease of native coronary artery without angina pectoris: Secondary | ICD-10-CM | POA: Diagnosis not present

## 2018-10-26 DIAGNOSIS — I5032 Chronic diastolic (congestive) heart failure: Secondary | ICD-10-CM | POA: Diagnosis not present

## 2018-10-26 DIAGNOSIS — D631 Anemia in chronic kidney disease: Secondary | ICD-10-CM | POA: Diagnosis not present

## 2018-10-26 DIAGNOSIS — N319 Neuromuscular dysfunction of bladder, unspecified: Secondary | ICD-10-CM | POA: Diagnosis not present

## 2018-10-27 ENCOUNTER — Other Ambulatory Visit: Payer: Self-pay | Admitting: Internal Medicine

## 2018-10-27 ENCOUNTER — Encounter (HOSPITAL_COMMUNITY)
Admission: RE | Admit: 2018-10-27 | Discharge: 2018-10-27 | Disposition: A | Discharge status: Home / Self Care | Payer: Medicare Other | Source: Ambulatory Visit | Attending: Internal Medicine | Admitting: Internal Medicine

## 2018-10-27 ENCOUNTER — Ambulatory Visit (HOSPITAL_COMMUNITY)
Admission: RE | Admit: 2018-10-27 | Discharge: 2018-10-27 | Disposition: A | Discharge status: Home / Self Care | Payer: Medicare Other | Source: Ambulatory Visit | Attending: Internal Medicine | Admitting: Internal Medicine

## 2018-10-27 ENCOUNTER — Encounter (HOSPITAL_COMMUNITY): Payer: Self-pay | Admitting: Diagnostic Radiology

## 2018-10-27 ENCOUNTER — Other Ambulatory Visit: Payer: Self-pay

## 2018-10-27 DIAGNOSIS — J439 Emphysema, unspecified: Secondary | ICD-10-CM | POA: Diagnosis not present

## 2018-10-27 DIAGNOSIS — N139 Obstructive and reflux uropathy, unspecified: Secondary | ICD-10-CM | POA: Diagnosis not present

## 2018-10-27 DIAGNOSIS — M1990 Unspecified osteoarthritis, unspecified site: Secondary | ICD-10-CM | POA: Diagnosis not present

## 2018-10-27 DIAGNOSIS — L89313 Pressure ulcer of right buttock, stage 3: Secondary | ICD-10-CM | POA: Diagnosis not present

## 2018-10-27 DIAGNOSIS — I69391 Dysphagia following cerebral infarction: Secondary | ICD-10-CM | POA: Diagnosis not present

## 2018-10-27 DIAGNOSIS — M869 Osteomyelitis, unspecified: Secondary | ICD-10-CM | POA: Insufficient documentation

## 2018-10-27 DIAGNOSIS — M150 Primary generalized (osteo)arthritis: Secondary | ICD-10-CM | POA: Diagnosis not present

## 2018-10-27 DIAGNOSIS — M549 Dorsalgia, unspecified: Secondary | ICD-10-CM | POA: Diagnosis not present

## 2018-10-27 DIAGNOSIS — G4733 Obstructive sleep apnea (adult) (pediatric): Secondary | ICD-10-CM | POA: Diagnosis not present

## 2018-10-27 DIAGNOSIS — M419 Scoliosis, unspecified: Secondary | ICD-10-CM | POA: Diagnosis not present

## 2018-10-27 DIAGNOSIS — I639 Cerebral infarction, unspecified: Secondary | ICD-10-CM | POA: Diagnosis not present

## 2018-10-27 DIAGNOSIS — M4628 Osteomyelitis of vertebra, sacral and sacrococcygeal region: Secondary | ICD-10-CM | POA: Diagnosis not present

## 2018-10-27 DIAGNOSIS — E7800 Pure hypercholesterolemia, unspecified: Secondary | ICD-10-CM | POA: Diagnosis not present

## 2018-10-27 DIAGNOSIS — I5032 Chronic diastolic (congestive) heart failure: Secondary | ICD-10-CM | POA: Diagnosis not present

## 2018-10-27 DIAGNOSIS — G8111 Spastic hemiplegia affecting right dominant side: Secondary | ICD-10-CM | POA: Diagnosis not present

## 2018-10-27 DIAGNOSIS — Z87440 Personal history of urinary (tract) infections: Secondary | ICD-10-CM | POA: Diagnosis not present

## 2018-10-27 DIAGNOSIS — R1310 Dysphagia, unspecified: Secondary | ICD-10-CM | POA: Diagnosis not present

## 2018-10-27 DIAGNOSIS — I69320 Aphasia following cerebral infarction: Secondary | ICD-10-CM | POA: Diagnosis not present

## 2018-10-27 DIAGNOSIS — E1122 Type 2 diabetes mellitus with diabetic chronic kidney disease: Secondary | ICD-10-CM | POA: Diagnosis not present

## 2018-10-27 DIAGNOSIS — I130 Hypertensive heart and chronic kidney disease with heart failure and stage 1 through stage 4 chronic kidney disease, or unspecified chronic kidney disease: Secondary | ICD-10-CM | POA: Diagnosis not present

## 2018-10-27 DIAGNOSIS — Z452 Encounter for adjustment and management of vascular access device: Secondary | ICD-10-CM | POA: Diagnosis not present

## 2018-10-27 DIAGNOSIS — I252 Old myocardial infarction: Secondary | ICD-10-CM | POA: Diagnosis not present

## 2018-10-27 DIAGNOSIS — N184 Chronic kidney disease, stage 4 (severe): Secondary | ICD-10-CM | POA: Diagnosis not present

## 2018-10-27 DIAGNOSIS — Z466 Encounter for fitting and adjustment of urinary device: Secondary | ICD-10-CM | POA: Diagnosis not present

## 2018-10-27 DIAGNOSIS — I69351 Hemiplegia and hemiparesis following cerebral infarction affecting right dominant side: Secondary | ICD-10-CM | POA: Diagnosis not present

## 2018-10-27 DIAGNOSIS — I2510 Atherosclerotic heart disease of native coronary artery without angina pectoris: Secondary | ICD-10-CM | POA: Diagnosis not present

## 2018-10-27 DIAGNOSIS — D631 Anemia in chronic kidney disease: Secondary | ICD-10-CM | POA: Diagnosis not present

## 2018-10-27 DIAGNOSIS — Z7401 Bed confinement status: Secondary | ICD-10-CM | POA: Diagnosis not present

## 2018-10-27 DIAGNOSIS — G8929 Other chronic pain: Secondary | ICD-10-CM | POA: Diagnosis not present

## 2018-10-27 DIAGNOSIS — E43 Unspecified severe protein-calorie malnutrition: Secondary | ICD-10-CM | POA: Diagnosis not present

## 2018-10-27 HISTORY — PX: IR FLUORO GUIDE CV LINE LEFT: IMG2282

## 2018-10-27 HISTORY — PX: IR US GUIDE VASC ACCESS LEFT: IMG2389

## 2018-10-27 MED ORDER — LIDOCAINE HCL 1 % IJ SOLN
INTRAMUSCULAR | Status: AC
  Filled 2018-10-27: qty 20

## 2018-10-27 MED ORDER — HEPARIN SOD (PORK) LOCK FLUSH 100 UNIT/ML IV SOLN
INTRAVENOUS | Status: AC
  Filled 2018-10-27: qty 5

## 2018-10-27 MED ORDER — GELATIN ABSORBABLE 12-7 MM EX MISC
CUTANEOUS | Status: AC
  Filled 2018-10-27: qty 1

## 2018-10-27 MED ORDER — HEPARIN SOD (PORK) LOCK FLUSH 100 UNIT/ML IV SOLN
250.0000 [IU] | INTRAVENOUS | Status: AC | PRN
  Administered 2018-10-27: 250 [IU]

## 2018-10-27 MED ORDER — SODIUM CHLORIDE 0.9 % IV SOLN
200.0000 mg | Freq: Once | INTRAVENOUS | Status: AC
  Administered 2018-10-27: 200 mg via INTRAVENOUS
  Filled 2018-10-27: qty 200

## 2018-10-27 MED ORDER — LIDOCAINE HCL 1 % IJ SOLN
INTRAMUSCULAR | Status: DC | PRN
  Administered 2018-10-27: 10 mL

## 2018-10-27 NOTE — Procedures (Signed)
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   Single lumen Powerline placed, tip in SVC.  Complications: None     EBL: less than 20 ml  Plan: Central line is ready to use.     Sevana Grandinetti R. Anselm Pancoast, MD  Pager: [phone]

## 2018-10-27 NOTE — Telephone Encounter (Signed)
I spoke with patient who is feeling ok. 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 This will certainly treat this e coli in the urine, though I suspect it is a colonized catheter rather than true uti.

## 2018-10-27 NOTE — Telephone Encounter (Signed)
I reviewed urine culture result, which has E. Coli and ok sensitivites. I tried calling Michelle Holmes, her daughter answered the phone, patient is at appointment now getting porta cath placed for chemo. Daughter says her urine is not cloudy any more, it cleared up after a catheter exchange. She says patient is still having abdominal pain off and on, but no fevers.   I will try to call patient back later today. I suspect this is colonization, doesn't sound like there are any current symptoms of true infection. But I will confirm with patient later.

## 2018-10-28 DIAGNOSIS — I69320 Aphasia following cerebral infarction: Secondary | ICD-10-CM | POA: Diagnosis not present

## 2018-10-28 DIAGNOSIS — Z466 Encounter for fitting and adjustment of urinary device: Secondary | ICD-10-CM | POA: Diagnosis not present

## 2018-10-28 DIAGNOSIS — M549 Dorsalgia, unspecified: Secondary | ICD-10-CM | POA: Diagnosis not present

## 2018-10-28 DIAGNOSIS — R1310 Dysphagia, unspecified: Secondary | ICD-10-CM | POA: Diagnosis not present

## 2018-10-28 DIAGNOSIS — J439 Emphysema, unspecified: Secondary | ICD-10-CM | POA: Diagnosis not present

## 2018-10-28 DIAGNOSIS — E43 Unspecified severe protein-calorie malnutrition: Secondary | ICD-10-CM | POA: Diagnosis not present

## 2018-10-28 DIAGNOSIS — M1990 Unspecified osteoarthritis, unspecified site: Secondary | ICD-10-CM | POA: Diagnosis not present

## 2018-10-28 DIAGNOSIS — Z87440 Personal history of urinary (tract) infections: Secondary | ICD-10-CM | POA: Diagnosis not present

## 2018-10-28 DIAGNOSIS — I69391 Dysphagia following cerebral infarction: Secondary | ICD-10-CM | POA: Diagnosis not present

## 2018-10-28 DIAGNOSIS — I5032 Chronic diastolic (congestive) heart failure: Secondary | ICD-10-CM | POA: Diagnosis not present

## 2018-10-28 DIAGNOSIS — E7800 Pure hypercholesterolemia, unspecified: Secondary | ICD-10-CM | POA: Diagnosis not present

## 2018-10-28 DIAGNOSIS — E1122 Type 2 diabetes mellitus with diabetic chronic kidney disease: Secondary | ICD-10-CM | POA: Diagnosis not present

## 2018-10-28 DIAGNOSIS — M419 Scoliosis, unspecified: Secondary | ICD-10-CM | POA: Diagnosis not present

## 2018-10-28 DIAGNOSIS — G4733 Obstructive sleep apnea (adult) (pediatric): Secondary | ICD-10-CM | POA: Diagnosis not present

## 2018-10-28 DIAGNOSIS — I2510 Atherosclerotic heart disease of native coronary artery without angina pectoris: Secondary | ICD-10-CM | POA: Diagnosis not present

## 2018-10-28 DIAGNOSIS — N139 Obstructive and reflux uropathy, unspecified: Secondary | ICD-10-CM | POA: Diagnosis not present

## 2018-10-28 DIAGNOSIS — I69351 Hemiplegia and hemiparesis following cerebral infarction affecting right dominant side: Secondary | ICD-10-CM | POA: Diagnosis not present

## 2018-10-28 DIAGNOSIS — I252 Old myocardial infarction: Secondary | ICD-10-CM | POA: Diagnosis not present

## 2018-10-28 DIAGNOSIS — N184 Chronic kidney disease, stage 4 (severe): Secondary | ICD-10-CM | POA: Diagnosis not present

## 2018-10-28 DIAGNOSIS — D631 Anemia in chronic kidney disease: Secondary | ICD-10-CM | POA: Diagnosis not present

## 2018-10-28 DIAGNOSIS — I130 Hypertensive heart and chronic kidney disease with heart failure and stage 1 through stage 4 chronic kidney disease, or unspecified chronic kidney disease: Secondary | ICD-10-CM | POA: Diagnosis not present

## 2018-10-28 DIAGNOSIS — L89313 Pressure ulcer of right buttock, stage 3: Secondary | ICD-10-CM | POA: Diagnosis not present

## 2018-10-28 DIAGNOSIS — Z7401 Bed confinement status: Secondary | ICD-10-CM | POA: Diagnosis not present

## 2018-10-28 DIAGNOSIS — G8929 Other chronic pain: Secondary | ICD-10-CM | POA: Diagnosis not present

## 2018-10-31 ENCOUNTER — Encounter: Payer: Self-pay | Admitting: Internal Medicine

## 2018-10-31 DIAGNOSIS — Z7401 Bed confinement status: Secondary | ICD-10-CM | POA: Diagnosis not present

## 2018-10-31 DIAGNOSIS — D631 Anemia in chronic kidney disease: Secondary | ICD-10-CM | POA: Diagnosis not present

## 2018-10-31 DIAGNOSIS — L89313 Pressure ulcer of right buttock, stage 3: Secondary | ICD-10-CM | POA: Diagnosis not present

## 2018-10-31 DIAGNOSIS — E1122 Type 2 diabetes mellitus with diabetic chronic kidney disease: Secondary | ICD-10-CM | POA: Diagnosis not present

## 2018-10-31 DIAGNOSIS — I2510 Atherosclerotic heart disease of native coronary artery without angina pectoris: Secondary | ICD-10-CM | POA: Diagnosis not present

## 2018-10-31 DIAGNOSIS — M419 Scoliosis, unspecified: Secondary | ICD-10-CM | POA: Diagnosis not present

## 2018-10-31 DIAGNOSIS — R1310 Dysphagia, unspecified: Secondary | ICD-10-CM | POA: Diagnosis not present

## 2018-10-31 DIAGNOSIS — I69391 Dysphagia following cerebral infarction: Secondary | ICD-10-CM | POA: Diagnosis not present

## 2018-10-31 DIAGNOSIS — E7800 Pure hypercholesterolemia, unspecified: Secondary | ICD-10-CM | POA: Diagnosis not present

## 2018-10-31 DIAGNOSIS — Z87440 Personal history of urinary (tract) infections: Secondary | ICD-10-CM | POA: Diagnosis not present

## 2018-10-31 DIAGNOSIS — I69320 Aphasia following cerebral infarction: Secondary | ICD-10-CM | POA: Diagnosis not present

## 2018-10-31 DIAGNOSIS — N184 Chronic kidney disease, stage 4 (severe): Secondary | ICD-10-CM | POA: Diagnosis not present

## 2018-10-31 DIAGNOSIS — I69351 Hemiplegia and hemiparesis following cerebral infarction affecting right dominant side: Secondary | ICD-10-CM | POA: Diagnosis not present

## 2018-10-31 DIAGNOSIS — Z466 Encounter for fitting and adjustment of urinary device: Secondary | ICD-10-CM | POA: Diagnosis not present

## 2018-10-31 DIAGNOSIS — M1990 Unspecified osteoarthritis, unspecified site: Secondary | ICD-10-CM | POA: Diagnosis not present

## 2018-10-31 DIAGNOSIS — G4733 Obstructive sleep apnea (adult) (pediatric): Secondary | ICD-10-CM | POA: Diagnosis not present

## 2018-10-31 DIAGNOSIS — J439 Emphysema, unspecified: Secondary | ICD-10-CM | POA: Diagnosis not present

## 2018-10-31 DIAGNOSIS — I5032 Chronic diastolic (congestive) heart failure: Secondary | ICD-10-CM | POA: Diagnosis not present

## 2018-10-31 DIAGNOSIS — G8929 Other chronic pain: Secondary | ICD-10-CM | POA: Diagnosis not present

## 2018-10-31 DIAGNOSIS — N139 Obstructive and reflux uropathy, unspecified: Secondary | ICD-10-CM | POA: Diagnosis not present

## 2018-10-31 DIAGNOSIS — I130 Hypertensive heart and chronic kidney disease with heart failure and stage 1 through stage 4 chronic kidney disease, or unspecified chronic kidney disease: Secondary | ICD-10-CM | POA: Diagnosis not present

## 2018-10-31 DIAGNOSIS — I252 Old myocardial infarction: Secondary | ICD-10-CM | POA: Diagnosis not present

## 2018-10-31 DIAGNOSIS — M549 Dorsalgia, unspecified: Secondary | ICD-10-CM | POA: Diagnosis not present

## 2018-10-31 DIAGNOSIS — E43 Unspecified severe protein-calorie malnutrition: Secondary | ICD-10-CM | POA: Diagnosis not present

## 2018-11-01 ENCOUNTER — Other Ambulatory Visit: Payer: Self-pay

## 2018-11-01 DIAGNOSIS — Z79891 Long term (current) use of opiate analgesic: Secondary | ICD-10-CM

## 2018-11-01 NOTE — Telephone Encounter (Signed)
Oxycodone HCl 10 MG TABS, REFILL REQUEST @  [facility] [place], [place] [phone] (Phone) [phone] (Fax)

## 2018-11-02 DIAGNOSIS — Z87440 Personal history of urinary (tract) infections: Secondary | ICD-10-CM | POA: Diagnosis not present

## 2018-11-02 DIAGNOSIS — M419 Scoliosis, unspecified: Secondary | ICD-10-CM | POA: Diagnosis not present

## 2018-11-02 DIAGNOSIS — I2510 Atherosclerotic heart disease of native coronary artery without angina pectoris: Secondary | ICD-10-CM | POA: Diagnosis not present

## 2018-11-02 DIAGNOSIS — I252 Old myocardial infarction: Secondary | ICD-10-CM | POA: Diagnosis not present

## 2018-11-02 DIAGNOSIS — M549 Dorsalgia, unspecified: Secondary | ICD-10-CM | POA: Diagnosis not present

## 2018-11-02 DIAGNOSIS — I5032 Chronic diastolic (congestive) heart failure: Secondary | ICD-10-CM | POA: Diagnosis not present

## 2018-11-02 DIAGNOSIS — Z7401 Bed confinement status: Secondary | ICD-10-CM | POA: Diagnosis not present

## 2018-11-02 DIAGNOSIS — E43 Unspecified severe protein-calorie malnutrition: Secondary | ICD-10-CM | POA: Diagnosis not present

## 2018-11-02 DIAGNOSIS — L89313 Pressure ulcer of right buttock, stage 3: Secondary | ICD-10-CM | POA: Diagnosis not present

## 2018-11-02 DIAGNOSIS — E1122 Type 2 diabetes mellitus with diabetic chronic kidney disease: Secondary | ICD-10-CM | POA: Diagnosis not present

## 2018-11-02 DIAGNOSIS — G4733 Obstructive sleep apnea (adult) (pediatric): Secondary | ICD-10-CM | POA: Diagnosis not present

## 2018-11-02 DIAGNOSIS — J439 Emphysema, unspecified: Secondary | ICD-10-CM | POA: Diagnosis not present

## 2018-11-02 DIAGNOSIS — I69351 Hemiplegia and hemiparesis following cerebral infarction affecting right dominant side: Secondary | ICD-10-CM | POA: Diagnosis not present

## 2018-11-02 DIAGNOSIS — N184 Chronic kidney disease, stage 4 (severe): Secondary | ICD-10-CM | POA: Diagnosis not present

## 2018-11-02 DIAGNOSIS — R1310 Dysphagia, unspecified: Secondary | ICD-10-CM | POA: Diagnosis not present

## 2018-11-02 DIAGNOSIS — G8929 Other chronic pain: Secondary | ICD-10-CM | POA: Diagnosis not present

## 2018-11-02 DIAGNOSIS — E7800 Pure hypercholesterolemia, unspecified: Secondary | ICD-10-CM | POA: Diagnosis not present

## 2018-11-02 DIAGNOSIS — D631 Anemia in chronic kidney disease: Secondary | ICD-10-CM | POA: Diagnosis not present

## 2018-11-02 DIAGNOSIS — I69320 Aphasia following cerebral infarction: Secondary | ICD-10-CM | POA: Diagnosis not present

## 2018-11-02 DIAGNOSIS — Z466 Encounter for fitting and adjustment of urinary device: Secondary | ICD-10-CM | POA: Diagnosis not present

## 2018-11-02 DIAGNOSIS — M1990 Unspecified osteoarthritis, unspecified site: Secondary | ICD-10-CM | POA: Diagnosis not present

## 2018-11-02 DIAGNOSIS — N139 Obstructive and reflux uropathy, unspecified: Secondary | ICD-10-CM | POA: Diagnosis not present

## 2018-11-02 DIAGNOSIS — I69391 Dysphagia following cerebral infarction: Secondary | ICD-10-CM | POA: Diagnosis not present

## 2018-11-02 DIAGNOSIS — I130 Hypertensive heart and chronic kidney disease with heart failure and stage 1 through stage 4 chronic kidney disease, or unspecified chronic kidney disease: Secondary | ICD-10-CM | POA: Diagnosis not present

## 2018-11-02 MED ORDER — OXYCODONE HCL 10 MG PO TABS: 10.0000 mg | ORAL_TABLET | Freq: Two times a day (BID) | ORAL | 0 refills | Status: AC | PRN

## 2018-11-03 ENCOUNTER — Encounter (HOSPITAL_BASED_OUTPATIENT_CLINIC_OR_DEPARTMENT_OTHER): Payer: Medicare Other | Attending: Internal Medicine | Admitting: Internal Medicine

## 2018-11-04 DIAGNOSIS — R1310 Dysphagia, unspecified: Secondary | ICD-10-CM | POA: Diagnosis not present

## 2018-11-04 DIAGNOSIS — M1990 Unspecified osteoarthritis, unspecified site: Secondary | ICD-10-CM | POA: Diagnosis not present

## 2018-11-04 DIAGNOSIS — G4733 Obstructive sleep apnea (adult) (pediatric): Secondary | ICD-10-CM | POA: Diagnosis not present

## 2018-11-04 DIAGNOSIS — I252 Old myocardial infarction: Secondary | ICD-10-CM | POA: Diagnosis not present

## 2018-11-04 DIAGNOSIS — I69320 Aphasia following cerebral infarction: Secondary | ICD-10-CM | POA: Diagnosis not present

## 2018-11-04 DIAGNOSIS — I2510 Atherosclerotic heart disease of native coronary artery without angina pectoris: Secondary | ICD-10-CM | POA: Diagnosis not present

## 2018-11-04 DIAGNOSIS — D631 Anemia in chronic kidney disease: Secondary | ICD-10-CM | POA: Diagnosis not present

## 2018-11-04 DIAGNOSIS — M419 Scoliosis, unspecified: Secondary | ICD-10-CM | POA: Diagnosis not present

## 2018-11-04 DIAGNOSIS — N139 Obstructive and reflux uropathy, unspecified: Secondary | ICD-10-CM | POA: Diagnosis not present

## 2018-11-04 DIAGNOSIS — L89313 Pressure ulcer of right buttock, stage 3: Secondary | ICD-10-CM | POA: Diagnosis not present

## 2018-11-04 DIAGNOSIS — E7800 Pure hypercholesterolemia, unspecified: Secondary | ICD-10-CM | POA: Diagnosis not present

## 2018-11-04 DIAGNOSIS — I5032 Chronic diastolic (congestive) heart failure: Secondary | ICD-10-CM | POA: Diagnosis not present

## 2018-11-04 DIAGNOSIS — E1122 Type 2 diabetes mellitus with diabetic chronic kidney disease: Secondary | ICD-10-CM | POA: Diagnosis not present

## 2018-11-04 DIAGNOSIS — I69351 Hemiplegia and hemiparesis following cerebral infarction affecting right dominant side: Secondary | ICD-10-CM | POA: Diagnosis not present

## 2018-11-04 DIAGNOSIS — I69391 Dysphagia following cerebral infarction: Secondary | ICD-10-CM | POA: Diagnosis not present

## 2018-11-04 DIAGNOSIS — I130 Hypertensive heart and chronic kidney disease with heart failure and stage 1 through stage 4 chronic kidney disease, or unspecified chronic kidney disease: Secondary | ICD-10-CM | POA: Diagnosis not present

## 2018-11-04 DIAGNOSIS — M549 Dorsalgia, unspecified: Secondary | ICD-10-CM | POA: Diagnosis not present

## 2018-11-04 DIAGNOSIS — Z466 Encounter for fitting and adjustment of urinary device: Secondary | ICD-10-CM | POA: Diagnosis not present

## 2018-11-04 DIAGNOSIS — N184 Chronic kidney disease, stage 4 (severe): Secondary | ICD-10-CM | POA: Diagnosis not present

## 2018-11-04 DIAGNOSIS — Z7401 Bed confinement status: Secondary | ICD-10-CM | POA: Diagnosis not present

## 2018-11-04 DIAGNOSIS — E43 Unspecified severe protein-calorie malnutrition: Secondary | ICD-10-CM | POA: Diagnosis not present

## 2018-11-04 DIAGNOSIS — G8929 Other chronic pain: Secondary | ICD-10-CM | POA: Diagnosis not present

## 2018-11-04 DIAGNOSIS — J439 Emphysema, unspecified: Secondary | ICD-10-CM | POA: Diagnosis not present

## 2018-11-04 DIAGNOSIS — Z87440 Personal history of urinary (tract) infections: Secondary | ICD-10-CM | POA: Diagnosis not present

## 2018-11-07 ENCOUNTER — Encounter: Payer: Self-pay | Admitting: Internal Medicine

## 2018-11-07 DIAGNOSIS — N139 Obstructive and reflux uropathy, unspecified: Secondary | ICD-10-CM | POA: Diagnosis not present

## 2018-11-07 DIAGNOSIS — I2510 Atherosclerotic heart disease of native coronary artery without angina pectoris: Secondary | ICD-10-CM | POA: Diagnosis not present

## 2018-11-07 DIAGNOSIS — M1990 Unspecified osteoarthritis, unspecified site: Secondary | ICD-10-CM | POA: Diagnosis not present

## 2018-11-07 DIAGNOSIS — M549 Dorsalgia, unspecified: Secondary | ICD-10-CM | POA: Diagnosis not present

## 2018-11-07 DIAGNOSIS — L89313 Pressure ulcer of right buttock, stage 3: Secondary | ICD-10-CM | POA: Diagnosis not present

## 2018-11-07 DIAGNOSIS — N184 Chronic kidney disease, stage 4 (severe): Secondary | ICD-10-CM | POA: Diagnosis not present

## 2018-11-07 DIAGNOSIS — G8929 Other chronic pain: Secondary | ICD-10-CM | POA: Diagnosis not present

## 2018-11-07 DIAGNOSIS — I5032 Chronic diastolic (congestive) heart failure: Secondary | ICD-10-CM | POA: Diagnosis not present

## 2018-11-07 DIAGNOSIS — E7800 Pure hypercholesterolemia, unspecified: Secondary | ICD-10-CM | POA: Diagnosis not present

## 2018-11-07 DIAGNOSIS — J439 Emphysema, unspecified: Secondary | ICD-10-CM | POA: Diagnosis not present

## 2018-11-07 DIAGNOSIS — D631 Anemia in chronic kidney disease: Secondary | ICD-10-CM | POA: Diagnosis not present

## 2018-11-07 DIAGNOSIS — Z7689 Persons encountering health services in other specified circumstances: Secondary | ICD-10-CM | POA: Diagnosis not present

## 2018-11-07 DIAGNOSIS — G4733 Obstructive sleep apnea (adult) (pediatric): Secondary | ICD-10-CM | POA: Diagnosis not present

## 2018-11-07 DIAGNOSIS — E1122 Type 2 diabetes mellitus with diabetic chronic kidney disease: Secondary | ICD-10-CM | POA: Diagnosis not present

## 2018-11-07 DIAGNOSIS — I69391 Dysphagia following cerebral infarction: Secondary | ICD-10-CM | POA: Diagnosis not present

## 2018-11-07 DIAGNOSIS — M419 Scoliosis, unspecified: Secondary | ICD-10-CM | POA: Diagnosis not present

## 2018-11-07 DIAGNOSIS — Z87440 Personal history of urinary (tract) infections: Secondary | ICD-10-CM | POA: Diagnosis not present

## 2018-11-07 DIAGNOSIS — E43 Unspecified severe protein-calorie malnutrition: Secondary | ICD-10-CM | POA: Diagnosis not present

## 2018-11-07 DIAGNOSIS — I69320 Aphasia following cerebral infarction: Secondary | ICD-10-CM | POA: Diagnosis not present

## 2018-11-07 DIAGNOSIS — Z466 Encounter for fitting and adjustment of urinary device: Secondary | ICD-10-CM | POA: Diagnosis not present

## 2018-11-07 DIAGNOSIS — I69351 Hemiplegia and hemiparesis following cerebral infarction affecting right dominant side: Secondary | ICD-10-CM | POA: Diagnosis not present

## 2018-11-07 DIAGNOSIS — Z7401 Bed confinement status: Secondary | ICD-10-CM | POA: Diagnosis not present

## 2018-11-07 DIAGNOSIS — I252 Old myocardial infarction: Secondary | ICD-10-CM | POA: Diagnosis not present

## 2018-11-07 DIAGNOSIS — I130 Hypertensive heart and chronic kidney disease with heart failure and stage 1 through stage 4 chronic kidney disease, or unspecified chronic kidney disease: Secondary | ICD-10-CM | POA: Diagnosis not present

## 2018-11-07 DIAGNOSIS — R1310 Dysphagia, unspecified: Secondary | ICD-10-CM | POA: Diagnosis not present

## 2018-11-09 ENCOUNTER — Telehealth: Payer: Self-pay | Admitting: *Deleted

## 2018-11-09 DIAGNOSIS — I69351 Hemiplegia and hemiparesis following cerebral infarction affecting right dominant side: Secondary | ICD-10-CM | POA: Diagnosis not present

## 2018-11-09 DIAGNOSIS — E1122 Type 2 diabetes mellitus with diabetic chronic kidney disease: Secondary | ICD-10-CM | POA: Diagnosis not present

## 2018-11-09 DIAGNOSIS — I5032 Chronic diastolic (congestive) heart failure: Secondary | ICD-10-CM | POA: Diagnosis not present

## 2018-11-09 DIAGNOSIS — I69391 Dysphagia following cerebral infarction: Secondary | ICD-10-CM | POA: Diagnosis not present

## 2018-11-09 DIAGNOSIS — G8929 Other chronic pain: Secondary | ICD-10-CM | POA: Diagnosis not present

## 2018-11-09 DIAGNOSIS — Z7401 Bed confinement status: Secondary | ICD-10-CM | POA: Diagnosis not present

## 2018-11-09 DIAGNOSIS — D631 Anemia in chronic kidney disease: Secondary | ICD-10-CM | POA: Diagnosis not present

## 2018-11-09 DIAGNOSIS — Z466 Encounter for fitting and adjustment of urinary device: Secondary | ICD-10-CM | POA: Diagnosis not present

## 2018-11-09 DIAGNOSIS — I130 Hypertensive heart and chronic kidney disease with heart failure and stage 1 through stage 4 chronic kidney disease, or unspecified chronic kidney disease: Secondary | ICD-10-CM | POA: Diagnosis not present

## 2018-11-09 DIAGNOSIS — Z87440 Personal history of urinary (tract) infections: Secondary | ICD-10-CM | POA: Diagnosis not present

## 2018-11-09 DIAGNOSIS — J439 Emphysema, unspecified: Secondary | ICD-10-CM | POA: Diagnosis not present

## 2018-11-09 DIAGNOSIS — I2510 Atherosclerotic heart disease of native coronary artery without angina pectoris: Secondary | ICD-10-CM | POA: Diagnosis not present

## 2018-11-09 DIAGNOSIS — I252 Old myocardial infarction: Secondary | ICD-10-CM | POA: Diagnosis not present

## 2018-11-09 DIAGNOSIS — M419 Scoliosis, unspecified: Secondary | ICD-10-CM | POA: Diagnosis not present

## 2018-11-09 DIAGNOSIS — L89313 Pressure ulcer of right buttock, stage 3: Secondary | ICD-10-CM | POA: Diagnosis not present

## 2018-11-09 DIAGNOSIS — E43 Unspecified severe protein-calorie malnutrition: Secondary | ICD-10-CM | POA: Diagnosis not present

## 2018-11-09 DIAGNOSIS — I69320 Aphasia following cerebral infarction: Secondary | ICD-10-CM | POA: Diagnosis not present

## 2018-11-09 DIAGNOSIS — G4733 Obstructive sleep apnea (adult) (pediatric): Secondary | ICD-10-CM | POA: Diagnosis not present

## 2018-11-09 DIAGNOSIS — N139 Obstructive and reflux uropathy, unspecified: Secondary | ICD-10-CM | POA: Diagnosis not present

## 2018-11-09 DIAGNOSIS — E7800 Pure hypercholesterolemia, unspecified: Secondary | ICD-10-CM | POA: Diagnosis not present

## 2018-11-09 DIAGNOSIS — N184 Chronic kidney disease, stage 4 (severe): Secondary | ICD-10-CM | POA: Diagnosis not present

## 2018-11-09 DIAGNOSIS — M549 Dorsalgia, unspecified: Secondary | ICD-10-CM | POA: Diagnosis not present

## 2018-11-09 DIAGNOSIS — M1990 Unspecified osteoarthritis, unspecified site: Secondary | ICD-10-CM | POA: Diagnosis not present

## 2018-11-09 DIAGNOSIS — R1310 Dysphagia, unspecified: Secondary | ICD-10-CM | POA: Diagnosis not present

## 2018-11-09 NOTE — Telephone Encounter (Signed)
Spoke with patient's daughter, Ozella Almond. Needs 3 day's notice for transportation so is unable to bring patient to clinic this week. Has appt at wound center on 11/17/2018 and daughter is not comfortable taking patient out twice in same week. [facility] appt scheduled for 11/21/2018 at 1:15. Hubbard Hartshorn, BSN, RN-BC

## 2018-11-09 NOTE — Telephone Encounter (Signed)
Thank you. Looks like these home health labs were ordered by Dr. Baxter Flattery who has started patient on Ceftaroline for sacral osteomyelitis. We will scan them in the chart and fax over to [place]. By my look, renal function declining with creatinine 3.0 and GFR 15, potassium 5.3, unsure if hemolyzed. Hgb is down to 7.7g with MCV of 94, down from 10g a few months ago.   I would suggest a follow up visit with [place] in [facility] to repeat these labs and evaluate for any causes of the worsening anemia, besides acute inflammation.

## 2018-11-09 NOTE — Telephone Encounter (Addendum)
Received faxed report (CBC, BMP, CK, Sed Rate, and C-Reactive protein) from [facility] drawn 11/07/2018. Abnormals noted, given to today's Attending to review. Per Dr. Evette Doffing results have been faxed to [facility] at [phone], Attention Dr. Baxter Flattery and [facility]AT Team.L. Jaicee Michelotti, BSN, RN-BC

## 2018-11-11 DIAGNOSIS — E43 Unspecified severe protein-calorie malnutrition: Secondary | ICD-10-CM | POA: Diagnosis not present

## 2018-11-11 DIAGNOSIS — R1310 Dysphagia, unspecified: Secondary | ICD-10-CM | POA: Diagnosis not present

## 2018-11-11 DIAGNOSIS — G4733 Obstructive sleep apnea (adult) (pediatric): Secondary | ICD-10-CM | POA: Diagnosis not present

## 2018-11-11 DIAGNOSIS — Z87440 Personal history of urinary (tract) infections: Secondary | ICD-10-CM | POA: Diagnosis not present

## 2018-11-11 DIAGNOSIS — M549 Dorsalgia, unspecified: Secondary | ICD-10-CM | POA: Diagnosis not present

## 2018-11-11 DIAGNOSIS — I69391 Dysphagia following cerebral infarction: Secondary | ICD-10-CM | POA: Diagnosis not present

## 2018-11-11 DIAGNOSIS — Z466 Encounter for fitting and adjustment of urinary device: Secondary | ICD-10-CM | POA: Diagnosis not present

## 2018-11-11 DIAGNOSIS — I252 Old myocardial infarction: Secondary | ICD-10-CM | POA: Diagnosis not present

## 2018-11-11 DIAGNOSIS — E7800 Pure hypercholesterolemia, unspecified: Secondary | ICD-10-CM | POA: Diagnosis not present

## 2018-11-11 DIAGNOSIS — N184 Chronic kidney disease, stage 4 (severe): Secondary | ICD-10-CM | POA: Diagnosis not present

## 2018-11-11 DIAGNOSIS — Z7401 Bed confinement status: Secondary | ICD-10-CM | POA: Diagnosis not present

## 2018-11-11 DIAGNOSIS — J439 Emphysema, unspecified: Secondary | ICD-10-CM | POA: Diagnosis not present

## 2018-11-11 DIAGNOSIS — N139 Obstructive and reflux uropathy, unspecified: Secondary | ICD-10-CM | POA: Diagnosis not present

## 2018-11-11 DIAGNOSIS — I69320 Aphasia following cerebral infarction: Secondary | ICD-10-CM | POA: Diagnosis not present

## 2018-11-11 DIAGNOSIS — I2510 Atherosclerotic heart disease of native coronary artery without angina pectoris: Secondary | ICD-10-CM | POA: Diagnosis not present

## 2018-11-11 DIAGNOSIS — E1122 Type 2 diabetes mellitus with diabetic chronic kidney disease: Secondary | ICD-10-CM | POA: Diagnosis not present

## 2018-11-11 DIAGNOSIS — I130 Hypertensive heart and chronic kidney disease with heart failure and stage 1 through stage 4 chronic kidney disease, or unspecified chronic kidney disease: Secondary | ICD-10-CM | POA: Diagnosis not present

## 2018-11-11 DIAGNOSIS — G8929 Other chronic pain: Secondary | ICD-10-CM | POA: Diagnosis not present

## 2018-11-11 DIAGNOSIS — I69351 Hemiplegia and hemiparesis following cerebral infarction affecting right dominant side: Secondary | ICD-10-CM | POA: Diagnosis not present

## 2018-11-11 DIAGNOSIS — I5032 Chronic diastolic (congestive) heart failure: Secondary | ICD-10-CM | POA: Diagnosis not present

## 2018-11-11 DIAGNOSIS — D631 Anemia in chronic kidney disease: Secondary | ICD-10-CM | POA: Diagnosis not present

## 2018-11-11 DIAGNOSIS — L89313 Pressure ulcer of right buttock, stage 3: Secondary | ICD-10-CM | POA: Diagnosis not present

## 2018-11-11 DIAGNOSIS — M1990 Unspecified osteoarthritis, unspecified site: Secondary | ICD-10-CM | POA: Diagnosis not present

## 2018-11-11 DIAGNOSIS — M419 Scoliosis, unspecified: Secondary | ICD-10-CM | POA: Diagnosis not present

## 2018-11-14 ENCOUNTER — Other Ambulatory Visit: Payer: Self-pay | Admitting: Internal Medicine

## 2018-11-14 DIAGNOSIS — I252 Old myocardial infarction: Secondary | ICD-10-CM | POA: Diagnosis not present

## 2018-11-14 DIAGNOSIS — G8929 Other chronic pain: Secondary | ICD-10-CM | POA: Diagnosis not present

## 2018-11-14 DIAGNOSIS — D631 Anemia in chronic kidney disease: Secondary | ICD-10-CM | POA: Diagnosis not present

## 2018-11-14 DIAGNOSIS — I130 Hypertensive heart and chronic kidney disease with heart failure and stage 1 through stage 4 chronic kidney disease, or unspecified chronic kidney disease: Secondary | ICD-10-CM | POA: Diagnosis not present

## 2018-11-14 DIAGNOSIS — N139 Obstructive and reflux uropathy, unspecified: Secondary | ICD-10-CM | POA: Diagnosis not present

## 2018-11-14 DIAGNOSIS — I69391 Dysphagia following cerebral infarction: Secondary | ICD-10-CM | POA: Diagnosis not present

## 2018-11-14 DIAGNOSIS — E1122 Type 2 diabetes mellitus with diabetic chronic kidney disease: Secondary | ICD-10-CM | POA: Diagnosis not present

## 2018-11-14 DIAGNOSIS — I10 Essential (primary) hypertension: Secondary | ICD-10-CM

## 2018-11-14 DIAGNOSIS — M1990 Unspecified osteoarthritis, unspecified site: Secondary | ICD-10-CM | POA: Diagnosis not present

## 2018-11-14 DIAGNOSIS — E7800 Pure hypercholesterolemia, unspecified: Secondary | ICD-10-CM | POA: Diagnosis not present

## 2018-11-14 DIAGNOSIS — Z466 Encounter for fitting and adjustment of urinary device: Secondary | ICD-10-CM | POA: Diagnosis not present

## 2018-11-14 DIAGNOSIS — R1310 Dysphagia, unspecified: Secondary | ICD-10-CM | POA: Diagnosis not present

## 2018-11-14 DIAGNOSIS — I69320 Aphasia following cerebral infarction: Secondary | ICD-10-CM | POA: Diagnosis not present

## 2018-11-14 DIAGNOSIS — J439 Emphysema, unspecified: Secondary | ICD-10-CM | POA: Diagnosis not present

## 2018-11-14 DIAGNOSIS — N184 Chronic kidney disease, stage 4 (severe): Secondary | ICD-10-CM | POA: Diagnosis not present

## 2018-11-14 DIAGNOSIS — E43 Unspecified severe protein-calorie malnutrition: Secondary | ICD-10-CM | POA: Diagnosis not present

## 2018-11-14 DIAGNOSIS — Z7401 Bed confinement status: Secondary | ICD-10-CM | POA: Diagnosis not present

## 2018-11-14 DIAGNOSIS — M419 Scoliosis, unspecified: Secondary | ICD-10-CM | POA: Diagnosis not present

## 2018-11-14 DIAGNOSIS — I69351 Hemiplegia and hemiparesis following cerebral infarction affecting right dominant side: Secondary | ICD-10-CM | POA: Diagnosis not present

## 2018-11-14 DIAGNOSIS — L89313 Pressure ulcer of right buttock, stage 3: Secondary | ICD-10-CM | POA: Diagnosis not present

## 2018-11-14 DIAGNOSIS — Z87440 Personal history of urinary (tract) infections: Secondary | ICD-10-CM | POA: Diagnosis not present

## 2018-11-14 DIAGNOSIS — I5032 Chronic diastolic (congestive) heart failure: Secondary | ICD-10-CM | POA: Diagnosis not present

## 2018-11-14 DIAGNOSIS — I2510 Atherosclerotic heart disease of native coronary artery without angina pectoris: Secondary | ICD-10-CM | POA: Diagnosis not present

## 2018-11-14 DIAGNOSIS — G4733 Obstructive sleep apnea (adult) (pediatric): Secondary | ICD-10-CM | POA: Diagnosis not present

## 2018-11-14 DIAGNOSIS — M549 Dorsalgia, unspecified: Secondary | ICD-10-CM | POA: Diagnosis not present

## 2018-11-16 DIAGNOSIS — Z466 Encounter for fitting and adjustment of urinary device: Secondary | ICD-10-CM | POA: Diagnosis not present

## 2018-11-16 DIAGNOSIS — N184 Chronic kidney disease, stage 4 (severe): Secondary | ICD-10-CM | POA: Diagnosis not present

## 2018-11-16 DIAGNOSIS — M549 Dorsalgia, unspecified: Secondary | ICD-10-CM | POA: Diagnosis not present

## 2018-11-16 DIAGNOSIS — I69391 Dysphagia following cerebral infarction: Secondary | ICD-10-CM | POA: Diagnosis not present

## 2018-11-16 DIAGNOSIS — I5032 Chronic diastolic (congestive) heart failure: Secondary | ICD-10-CM | POA: Diagnosis not present

## 2018-11-16 DIAGNOSIS — R1310 Dysphagia, unspecified: Secondary | ICD-10-CM | POA: Diagnosis not present

## 2018-11-16 DIAGNOSIS — I252 Old myocardial infarction: Secondary | ICD-10-CM | POA: Diagnosis not present

## 2018-11-16 DIAGNOSIS — G4733 Obstructive sleep apnea (adult) (pediatric): Secondary | ICD-10-CM | POA: Diagnosis not present

## 2018-11-16 DIAGNOSIS — Z87440 Personal history of urinary (tract) infections: Secondary | ICD-10-CM | POA: Diagnosis not present

## 2018-11-16 DIAGNOSIS — J439 Emphysema, unspecified: Secondary | ICD-10-CM | POA: Diagnosis not present

## 2018-11-16 DIAGNOSIS — I69320 Aphasia following cerebral infarction: Secondary | ICD-10-CM | POA: Diagnosis not present

## 2018-11-16 DIAGNOSIS — N139 Obstructive and reflux uropathy, unspecified: Secondary | ICD-10-CM | POA: Diagnosis not present

## 2018-11-16 DIAGNOSIS — G8929 Other chronic pain: Secondary | ICD-10-CM | POA: Diagnosis not present

## 2018-11-16 DIAGNOSIS — I130 Hypertensive heart and chronic kidney disease with heart failure and stage 1 through stage 4 chronic kidney disease, or unspecified chronic kidney disease: Secondary | ICD-10-CM | POA: Diagnosis not present

## 2018-11-16 DIAGNOSIS — M1990 Unspecified osteoarthritis, unspecified site: Secondary | ICD-10-CM | POA: Diagnosis not present

## 2018-11-16 DIAGNOSIS — E1122 Type 2 diabetes mellitus with diabetic chronic kidney disease: Secondary | ICD-10-CM | POA: Diagnosis not present

## 2018-11-16 DIAGNOSIS — E43 Unspecified severe protein-calorie malnutrition: Secondary | ICD-10-CM | POA: Diagnosis not present

## 2018-11-16 DIAGNOSIS — M419 Scoliosis, unspecified: Secondary | ICD-10-CM | POA: Diagnosis not present

## 2018-11-16 DIAGNOSIS — N319 Neuromuscular dysfunction of bladder, unspecified: Secondary | ICD-10-CM | POA: Diagnosis not present

## 2018-11-16 DIAGNOSIS — E7800 Pure hypercholesterolemia, unspecified: Secondary | ICD-10-CM | POA: Diagnosis not present

## 2018-11-16 DIAGNOSIS — D631 Anemia in chronic kidney disease: Secondary | ICD-10-CM | POA: Diagnosis not present

## 2018-11-16 DIAGNOSIS — I69351 Hemiplegia and hemiparesis following cerebral infarction affecting right dominant side: Secondary | ICD-10-CM | POA: Diagnosis not present

## 2018-11-16 DIAGNOSIS — Z7401 Bed confinement status: Secondary | ICD-10-CM | POA: Diagnosis not present

## 2018-11-16 DIAGNOSIS — L89313 Pressure ulcer of right buttock, stage 3: Secondary | ICD-10-CM | POA: Diagnosis not present

## 2018-11-16 DIAGNOSIS — I2510 Atherosclerotic heart disease of native coronary artery without angina pectoris: Secondary | ICD-10-CM | POA: Diagnosis not present

## 2018-11-17 ENCOUNTER — Encounter (HOSPITAL_BASED_OUTPATIENT_CLINIC_OR_DEPARTMENT_OTHER): Payer: Medicare Other | Admitting: Internal Medicine

## 2018-11-17 ENCOUNTER — Other Ambulatory Visit: Payer: Self-pay

## 2018-11-17 DIAGNOSIS — L89319 Pressure ulcer of right buttock, unspecified stage: Secondary | ICD-10-CM | POA: Diagnosis not present

## 2018-11-17 DIAGNOSIS — S21201A Unspecified open wound of right back wall of thorax without penetration into thoracic cavity, initial encounter: Secondary | ICD-10-CM | POA: Diagnosis not present

## 2018-11-17 NOTE — Progress Notes (Signed)
Michelle, Holmes ([id number]) Visit Report for 11/17/2018 HPI Details Patient Name: Date of Service: Michelle Holmes, Michelle Holmes 11/17/2018 12:30 PM Medical Record [id number] Patient Account Number: [id number] Date of Birth/Sex: Treating RN: 11-Dec-1950 (68 y.o. Michelle Holmes, Michelle Holmes Primary Care Provider: Earlene Plater Other Clinician: Referring Provider: Treating Provider/Extender:Robson, Alvin Critchley, ANDREW Weeks in Treatment: 19 History of Present Illness Location: sacrum and left posterior heel Quality: the patient is unable to qualify or describe pain Duration: sacral pressure ulcer has been there for approximately 4 months; the left posterior heel ulcer was discovered today Context: both ulcers are result of recurring pressure Modifying Factors: recurring pressure is in exacerbating condition for both areas HPI Description: 03/05/16- Michelle Holmes arrives today, accompanied by her daughter, for evaluation of her sacral pressure ulcer. Her daughter states that she has had this pressure ulcer for approximately 4 months, receiving treatment from [facility]. They recently changed primary care physicians to Dr. Benjamine Mola. The initial appointment was on 02/17/2016 at which time a recommendation was made for a gel mattress overlay for her hospital bed. According to the daughter this has not been put into place. The sacral pressure ulcer has been treated with saline wet to dry dressings daily per the directions of home health. She has been on no antibiotic therapy for this ulcer. The daughter states that the patient is difficult and uncooperative with repositioning, but has been getting a little bit better. The daughter also states that her diet and appetite have increased over the past month or so. According to lab work obtained in January 2018: albumin of 3 and the A1c of 5.8. She is has a history of diabetes, but has not been treated with oral agents or insulin for several months. After a more  thorough assessment she was noted to have a pressure injury to her left posterior heel, the daughter was aware of this and states that her sister had purchased what appears to be heel protectors/cups, otherwise there has been no treatment or intervention to this ulcer. 03/26/16; this is a patient I haven't seen in 3 weeks. 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 04/09/16; this patient has a stage III pressure ulcer over her left sacrum or heels of apparently healed over. She has her level to surface for her bed. We have been using Prisma. 04/16/16; her heel wounds are apparently closed over, I did not look at these today although I like to do this next week. The area on her sacrum has closed and nicely in the superior tunnel is down to slightly over 1 cm from 2 she has been using Prisma. Claims to be religiously offloading this 04/30/16- patient is here for follow-up of evaluation of her sacral pressure ulcer and left posterior heel. The left posterior heel is closed, remains healed. She was fit into today's schedule secondary to an appointment she had with urology earlier this morning. She is voicing no concerns or complaints 05/14/16; sacral pressure area has come in nicely. Only small open area remains. Using [place] and she has no voice concerns. According to her daughter her left posterior heel ulcer remains closed 05/28/16- She arrives for follow up evaluation of her sacral ulcer. She is accompanied by her daughter, neither are voicing any complaints or concerns READMISSION 07/07/2018 Patient we had 2 years ago with a pressure ulcer  on her sacrum. She has chronic right hemiparesis as a result of a remote CVA. She is accompanied by her daughter who tells me that she developed a pressure ulcer over the right ischium about a month  ago. This started as a superficial open wound however on arrival in clinic this actually tunnels superiorly. They have home health and are using Santyl moistened gauze packing. They are here for our review of this suggested by the home health agency. Past medical history includes type 2 diabetes diet controlled, COPD, CHF, obstructive sleep apnea, coronary artery disease, remote CVA and a history of colon cancer. 7/6-Patient returns with follow-up for the sacral ulcer which is noted to have some drainage that is malodorous, this has not changed since the last visit, she is continued with silver alginate every other day dressing changes, she is accompanied by her daughter, home health is continuing the visits. 7/20; x-ray that was done on 7/6 did not show radiographic evidence of osteomyelitis. I note that a culture I did when I first saw this patient on 6/11 showed a few Proteus. I believe she had a short course of antibiotics. We have been using silver alginate packing to the wound. Her family was here so she eats and drinks well. 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 Notable that the CT scan showed evidence of osteomyelitis of the right ischio. I think they completed the [place]. For some reason they do not have an appointment with infectious disease The concerning area on the right heel closed over 9/24; 1 month follow-up the patient went to see infectious disease and it was recommended that she do a trial of ceftriaxone 2 mg IV plus daptomycin. The appointment was on 09/22/2018 however so far they have heard nothing about this. Our staff is called infectious disease to investigate. She has a new area on the right upper back which looks like an excoriation probably from transferring. 10/22; 1 month follow-up. She last saw infectious  disease on 10/1. She is now on week 3 of ceftaroline. The daptomycin was discontinued. Dr. Graylon Good was the doctor she saw. She is having weekly BMPs CBC sed rates CRP and CKs. I have not had a chance at the time of this dictation to look up the values. This is still a small probing wound today with 2'.7 cm of tunneling depth. There is no palpable bone over this is certainly not any better. There is tenderness around the wound area palpating over the ischial tuberosity itself Electronic Signature(s) Signed: 11/17/2018 6:29:04 PM By: Linton Ham MD Entered By: Linton Ham on 11/17/2018 13:40:02 -------------------------------------------------------------------------------- Physical Exam Details Patient Name: Date of Service: Michelle Holmes 11/17/2018 12:30 PM Medical Record [id number] Patient Account Number: [id number] Date of Birth/Sex: Treating RN: October 15, 1950 (68 y.o. Debby Bud Primary Care Provider: Earlene Plater Other Clinician: Referring Provider: Treating Provider/Extender:Robson, Alvin Critchley, ANDREW Weeks in Treatment: 25 Constitutional Patient is hypertensive.. Pulse regular and within target range for patient.Marland Kitchen Respirations regular, non-labored and within target range.. Temperature is normal and within the target range for the patient.Marland Kitchen Appears in no distress. Notes Wound exam; right ischial tuberosity. The patient continues to have the small probing area. I did not think this got to bone today however the depth is worse than last time at 2.7 cm. Electronic Signature(s) Signed: 11/17/2018 6:29:04 PM By: Dellia Nims,  Legrand Como MD Entered By: Linton Ham on 11/17/2018 13:40:48 -------------------------------------------------------------------------------- Physician Orders Details Patient Name: Date of Service: Michelle Holmes, Michelle Holmes 11/17/2018 12:30 PM Medical Record [id number] Patient Account Number: [id number] Date of Birth/Sex: Treating  RN: [birth year] (68 y.o. Debby Bud Primary Care Provider: Earlene Plater Other Clinician: Referring Provider: Treating Provider/Extender:Robson, Alvin Critchley, Jasmine Pang in Treatment: 84 Verbal / Phone Orders: No Diagnosis Coding ICD-10 Coding Code Description L89.313 Pressure ulcer of right buttock, stage 3 M86.68 Other chronic osteomyelitis, other site S20.411D Abrasion of right back wall of thorax, subsequent encounter Follow-up Appointments Return Appointment in 2 weeks. Harrel Lemon - Room 5* Dressing Change Frequency Change Dressing every other day. - home health Change dressing three times week. family member to change all other days. Skin Barriers/Peri-Wound Care Wound #3 Right Ischium Skin Prep Wound #6 Right Back Skin Prep Wound Cleansing Clean wound with Wound Cleanser - or normal saline - all wounds Primary Wound Dressing Wound #3 Right Ischium Other: - iodoform packing. Wound #6 Right Back Other: - barrier cream to open area and apply lotion to dry skin. Secondary Dressing Wound #3 Right Ischium Foam Border - or ABD pad and tape Wound #6 Right Back Foam Off-Loading Air fluidized (Group 3) mattress Turn and reposition every 2 hours [facility] for wound care. Jackquline [place] Electronic Signature(s) Signed: 11/17/2018 6:29:04 PM By: Linton Ham MD Signed: 11/17/2018 6:35:09 PM By: Deon Pilling Entered By: Deon Pilling on 11/17/2018 13:34:36 -------------------------------------------------------------------------------- Problem List Details Patient Name: Date of Service: Michelle Holmes, Michelle Holmes 11/17/2018 12:30 PM Medical Record [id number] Patient Account Number: [id number] Date of Birth/Sex: Treating RN: 1950-02-13 (68 y.o. Debby Bud Primary Care Provider: Earlene Plater Other Clinician: Referring Provider: Treating Provider/Extender:Robson, Alvin Critchley, Jasmine Pang in Treatment:  19 Active Problems ICD-10 Evaluated Encounter Code Description Active Date Today Diagnosis L89.313 Pressure ulcer of right buttock, stage 3 07/07/2018 No Yes M86.68 Other chronic osteomyelitis, other site 09/19/2018 No Yes S20.411D Abrasion of right back wall of thorax, subsequent 10/20/2018 No Yes encounter Inactive Problems Resolved Problems Electronic Signature(s) Signed: 11/17/2018 6:29:04 PM By: Linton Ham MD Entered By: Linton Ham on 11/17/2018 13:36:27 -------------------------------------------------------------------------------- Progress Note Details Patient Name: Date of Service: Michelle Holmes 11/17/2018 12:30 PM Medical Record [id number] Patient Account Number: [id number] Date of Birth/Sex: Treating RN: [birth year] (68 y.o. Michelle Holmes, Michelle Holmes Primary Care Provider: Earlene Plater Other Clinician: Referring Provider: Treating Provider/Extender:Robson, Alvin Critchley, ANDREW Weeks in Treatment: 19 Subjective History of Present Illness (HPI) The following HPI elements were documented for the patient's wound: Location: sacrum and left posterior heel Quality: the patient is unable to qualify or describe pain Duration: sacral pressure ulcer has been there for approximately 4 months; the left posterior heel ulcer was discovered today Context: both ulcers are result of recurring pressure Modifying Factors: recurring pressure is in exacerbating condition for both areas 03/05/16- Ms. Bow arrives today, accompanied by her daughter, for evaluation of her sacral pressure ulcer. Her daughter states that she has had this pressure ulcer for approximately 4 months, receiving treatment from [facility]. They recently changed primary care physicians to Dr. Benjamine Mola. The initial appointment was on 02/17/2016 at which time a recommendation was made for a gel mattress overlay for her hospital bed. According to the daughter this has not been put into place. The sacral  pressure ulcer has been treated with saline wet to dry dressings daily per the directions of home health. She has been on no antibiotic therapy for  this ulcer. The daughter states that the patient is difficult and uncooperative with repositioning, but has been getting a little bit better. The daughter also states that her diet and appetite have increased over the past month or so. According to lab work obtained in January 2018: albumin of 3 and the A1c of 5.8. She is has a history of diabetes, but has not been treated with oral agents or insulin for several months. After a more thorough assessment she was noted to have a pressure injury to her left posterior heel, the daughter was aware of this and states that her sister had purchased what appears to be heel protectors/cups, otherwise there has been no treatment or intervention to this ulcer. 03/26/16; this is a patient I haven't seen in 3 weeks. 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 04/09/16; this patient has a stage III pressure ulcer over her left sacrum or heels of apparently healed over. She has her level to surface for her bed. We have been using Prisma. 04/16/16; her heel wounds are apparently closed over, I did not look at these today although I like to do this next week. The area on her sacrum has closed and nicely in the superior tunnel is down to slightly over 1 cm from 2 she has been using Prisma. Claims to be religiously offloading this 04/30/16- patient is here for follow-up of evaluation of her sacral pressure ulcer and left posterior heel. The left posterior heel is closed, remains healed. She was fit into today's schedule secondary to an appointment she had with urology earlier this morning. She is voicing no concerns or complaints 05/14/16; sacral pressure area has come  in nicely. Only small open area remains. Using [place] and she has no voice concerns. According to her daughter her left posterior heel ulcer remains closed 05/28/16- She arrives for follow up evaluation of her sacral ulcer. She is accompanied by her daughter, neither are voicing any complaints or concerns READMISSION 07/07/2018 Patient we had 2 years ago with a pressure ulcer on her sacrum. She has chronic right hemiparesis as a result of a remote CVA. She is accompanied by her daughter who tells me that she developed a pressure ulcer over the right ischium about a month ago. This started as a superficial open wound however on arrival in clinic this actually tunnels superiorly. They have home health and are using Santyl moistened gauze packing. They are here for our review of this suggested by the home health agency. Past medical history includes type 2 diabetes diet controlled, COPD, CHF, obstructive sleep apnea, coronary artery disease, remote CVA and a history of colon cancer. 7/6-Patient returns with follow-up for the sacral ulcer which is noted to have some drainage that is malodorous, this has not changed since the last visit, she is continued with silver alginate every other day dressing changes, she is accompanied by her daughter, home health is continuing the visits. 7/20; x-ray that was done on 7/6 did not show radiographic evidence of osteomyelitis. I note that a culture I did when I first saw this patient on 6/11 showed a few Proteus. I believe she had a short course of antibiotics. We have been using silver alginate packing to the wound. Her family was here so she eats and drinks well. 8/10-Patient is back here at 2 weeks with  CT showing evidence of osteomyelitis of right ischium, we have been using silver alginate to the wounds and she has been on Omnicef 8/24; 2-week follow-up. Notable that the CT scan showed evidence of osteomyelitis of the right ischio. I think  they completed the [place]. For some reason they do not have an appointment with infectious disease The concerning area on the right heel closed over 9/24; 1 month follow-up the patient went to see infectious disease and it was recommended that she do a trial of ceftriaxone 2 mg IV plus daptomycin. The appointment was on 09/22/2018 however so far they have heard nothing about this. Our staff is called infectious disease to investigate. She has a new area on the right upper back which looks like an excoriation probably from transferring. 10/22; 1 month follow-up. She last saw infectious disease on 10/1. She is now on week 3 of ceftaroline. The daptomycin was discontinued. Dr. Graylon Good was the doctor she saw. She is having weekly BMPs CBC sed rates CRP and CKs. I have not had a chance at the time of this dictation to look up the values. This is still a small probing wound today with 2'.7 cm of tunneling depth. There is no palpable bone over this is certainly not any better. There is tenderness around the wound area palpating over the ischial tuberosity itself Objective Constitutional Patient is hypertensive.. Pulse regular and within target range for patient.Marland Kitchen Respirations regular, non-labored and within target range.. Temperature is normal and within the target range for the patient.Marland Kitchen Appears in no distress. Vitals Time Taken: 1:08 PM, Height: 69 in, Weight: 142 lbs, BMI: 21, Temperature: 98.0 F, Pulse: 81 bpm, Respiratory Rate: 18 breaths/min, Blood Pressure: 160/70 mmHg. General Notes: Wound exam; right ischial tuberosity. The patient continues to have the small probing area. I did not think this got to bone today however the depth is worse than last time at 2.7 cm. Integumentary (Hair, Skin) Wound #3 status is Open. Original cause of wound was Pressure Injury. The wound is located on the Right Ischium. The wound measures 0.2cm length x 0.2cm width x 2.7cm depth; 0.031cm^2 area and 0.085cm^3  volume. There is Fat Layer (Subcutaneous Tissue) Exposed exposed. There is no tunneling noted. There is a medium amount of serosanguineous drainage noted. The wound margin is well defined and not attached to the wound base. There is large (67-100%) pink granulation within the wound bed. There is no necrotic tissue within the wound bed. Wound #6 status is Open. Original cause of wound was Gradually Appeared. The wound is located on the Right Back. The wound measures 1cm length x 1cm width x 0.1cm depth; 0.785cm^2 area and 0.079cm^3 volume. There is Fat Layer (Subcutaneous Tissue) Exposed exposed. There is no tunneling or undermining noted. There is a medium amount of serosanguineous drainage noted. The wound margin is flat and intact. There is large (67-100%) red granulation within the wound bed. There is no necrotic tissue within the wound bed. Assessment Active Problems ICD-10 Pressure ulcer of right buttock, stage 3 Other chronic osteomyelitis, other site Abrasion of right back wall of thorax, subsequent encounter Plan Follow-up Appointments: Return Appointment in 2 weeks. Harrel Lemon - Room 5* Dressing Change Frequency: Change Dressing every other day. - home health Change dressing three times week. family member to change all other days. Skin Barriers/Peri-Wound Care: Wound #3 Right Ischium: Skin Prep Wound #6 Right Back: Skin Prep Wound Cleansing: Clean wound with Wound Cleanser - or normal saline - all wounds Primary  Wound Dressing: Wound #3 Right Ischium: Other: - iodoform packing. Wound #6 Right Back: Other: - barrier cream to open area and apply lotion to dry skin. Secondary Dressing: Wound #3 Right Ischium: Foam Border - or ABD pad and tape Wound #6 Right Back: Foam Off-Loading: Air fluidized (Group 3) mattress Turn and reposition every 2 hours Home Health: [facility] for wound care. - [facility] I change the primary dressing here to do  iodoform packing from silver cell. Hopefully this will be easier to do in the small wound area 2. She is on ceftaroline now for 3 weeks. I have not checked her inflammatory markers or lab work. This is being followed by Dr. Baxter Flattery 3. Follow-up 2 weeks. Electronic Signature(s) Signed: 11/17/2018 6:29:04 PM By: Linton Ham MD Entered By: Linton Ham on 11/17/2018 13:41:53 -------------------------------------------------------------------------------- SuperBill Details Patient Name: Date of Service: Michelle Holmes, Michelle Holmes 11/17/2018 Medical Record [id number] Patient Account Number: [id number] Date of Birth/Sex: Treating RN: [birth year] (68 y.o. Debby Bud Primary Care Provider: Earlene Plater Other Clinician: Referring Provider: Treating Provider/Extender:Robson, Alvin Critchley, ANDREW Weeks in Treatment: 19 Diagnosis Coding ICD-10 Codes Code Description E10.071 Pressure ulcer of right buttock, stage 3 M86.68 Other chronic osteomyelitis, other site S20.411D Abrasion of right back wall of thorax, subsequent encounter Physician Procedures CPT4 Code: [id number] Description: 32549 - WC PHYS LEVEL 3 - EST PT ICD-10 Diagnosis Description L89.313 Pressure ulcer of right buttock, stage 3 M86.68 Other chronic osteomyelitis, other site Modifier: Quantity: 1 Electronic Signature(s) Signed: 11/17/2018 6:29:04 PM By: Linton Ham MD Entered By: Linton Ham on 11/17/2018 13:42:10

## 2018-11-18 DIAGNOSIS — L89313 Pressure ulcer of right buttock, stage 3: Secondary | ICD-10-CM | POA: Diagnosis not present

## 2018-11-18 DIAGNOSIS — N139 Obstructive and reflux uropathy, unspecified: Secondary | ICD-10-CM | POA: Diagnosis not present

## 2018-11-18 DIAGNOSIS — I69391 Dysphagia following cerebral infarction: Secondary | ICD-10-CM | POA: Diagnosis not present

## 2018-11-18 DIAGNOSIS — J439 Emphysema, unspecified: Secondary | ICD-10-CM | POA: Diagnosis not present

## 2018-11-18 DIAGNOSIS — I5032 Chronic diastolic (congestive) heart failure: Secondary | ICD-10-CM | POA: Diagnosis not present

## 2018-11-18 DIAGNOSIS — E7800 Pure hypercholesterolemia, unspecified: Secondary | ICD-10-CM | POA: Diagnosis not present

## 2018-11-18 DIAGNOSIS — G4733 Obstructive sleep apnea (adult) (pediatric): Secondary | ICD-10-CM | POA: Diagnosis not present

## 2018-11-18 DIAGNOSIS — I69320 Aphasia following cerebral infarction: Secondary | ICD-10-CM | POA: Diagnosis not present

## 2018-11-18 DIAGNOSIS — N184 Chronic kidney disease, stage 4 (severe): Secondary | ICD-10-CM | POA: Diagnosis not present

## 2018-11-18 DIAGNOSIS — E1122 Type 2 diabetes mellitus with diabetic chronic kidney disease: Secondary | ICD-10-CM | POA: Diagnosis not present

## 2018-11-18 DIAGNOSIS — Z466 Encounter for fitting and adjustment of urinary device: Secondary | ICD-10-CM | POA: Diagnosis not present

## 2018-11-18 DIAGNOSIS — D631 Anemia in chronic kidney disease: Secondary | ICD-10-CM | POA: Diagnosis not present

## 2018-11-18 DIAGNOSIS — I69351 Hemiplegia and hemiparesis following cerebral infarction affecting right dominant side: Secondary | ICD-10-CM | POA: Diagnosis not present

## 2018-11-18 DIAGNOSIS — I252 Old myocardial infarction: Secondary | ICD-10-CM | POA: Diagnosis not present

## 2018-11-18 DIAGNOSIS — G8929 Other chronic pain: Secondary | ICD-10-CM | POA: Diagnosis not present

## 2018-11-18 DIAGNOSIS — M1990 Unspecified osteoarthritis, unspecified site: Secondary | ICD-10-CM | POA: Diagnosis not present

## 2018-11-18 DIAGNOSIS — Z87440 Personal history of urinary (tract) infections: Secondary | ICD-10-CM | POA: Diagnosis not present

## 2018-11-18 DIAGNOSIS — Z7401 Bed confinement status: Secondary | ICD-10-CM | POA: Diagnosis not present

## 2018-11-18 DIAGNOSIS — I2510 Atherosclerotic heart disease of native coronary artery without angina pectoris: Secondary | ICD-10-CM | POA: Diagnosis not present

## 2018-11-18 DIAGNOSIS — E43 Unspecified severe protein-calorie malnutrition: Secondary | ICD-10-CM | POA: Diagnosis not present

## 2018-11-18 DIAGNOSIS — M419 Scoliosis, unspecified: Secondary | ICD-10-CM | POA: Diagnosis not present

## 2018-11-18 DIAGNOSIS — M549 Dorsalgia, unspecified: Secondary | ICD-10-CM | POA: Diagnosis not present

## 2018-11-18 DIAGNOSIS — R1310 Dysphagia, unspecified: Secondary | ICD-10-CM | POA: Diagnosis not present

## 2018-11-18 DIAGNOSIS — I130 Hypertensive heart and chronic kidney disease with heart failure and stage 1 through stage 4 chronic kidney disease, or unspecified chronic kidney disease: Secondary | ICD-10-CM | POA: Diagnosis not present

## 2018-11-21 ENCOUNTER — Encounter: Payer: Self-pay | Admitting: Internal Medicine

## 2018-11-21 ENCOUNTER — Ambulatory Visit: Payer: Medicare Other

## 2018-11-21 DIAGNOSIS — E43 Unspecified severe protein-calorie malnutrition: Secondary | ICD-10-CM | POA: Diagnosis not present

## 2018-11-21 DIAGNOSIS — N184 Chronic kidney disease, stage 4 (severe): Secondary | ICD-10-CM | POA: Diagnosis not present

## 2018-11-21 DIAGNOSIS — Z87440 Personal history of urinary (tract) infections: Secondary | ICD-10-CM | POA: Diagnosis not present

## 2018-11-21 DIAGNOSIS — R1310 Dysphagia, unspecified: Secondary | ICD-10-CM | POA: Diagnosis not present

## 2018-11-21 DIAGNOSIS — J439 Emphysema, unspecified: Secondary | ICD-10-CM | POA: Diagnosis not present

## 2018-11-21 DIAGNOSIS — I130 Hypertensive heart and chronic kidney disease with heart failure and stage 1 through stage 4 chronic kidney disease, or unspecified chronic kidney disease: Secondary | ICD-10-CM | POA: Diagnosis not present

## 2018-11-21 DIAGNOSIS — G4733 Obstructive sleep apnea (adult) (pediatric): Secondary | ICD-10-CM | POA: Diagnosis not present

## 2018-11-21 DIAGNOSIS — D631 Anemia in chronic kidney disease: Secondary | ICD-10-CM | POA: Diagnosis not present

## 2018-11-21 DIAGNOSIS — I69391 Dysphagia following cerebral infarction: Secondary | ICD-10-CM | POA: Diagnosis not present

## 2018-11-21 DIAGNOSIS — I5032 Chronic diastolic (congestive) heart failure: Secondary | ICD-10-CM | POA: Diagnosis not present

## 2018-11-21 DIAGNOSIS — E1122 Type 2 diabetes mellitus with diabetic chronic kidney disease: Secondary | ICD-10-CM | POA: Diagnosis not present

## 2018-11-21 DIAGNOSIS — Z792 Long term (current) use of antibiotics: Secondary | ICD-10-CM | POA: Diagnosis not present

## 2018-11-21 DIAGNOSIS — N139 Obstructive and reflux uropathy, unspecified: Secondary | ICD-10-CM | POA: Diagnosis not present

## 2018-11-21 DIAGNOSIS — M1990 Unspecified osteoarthritis, unspecified site: Secondary | ICD-10-CM | POA: Diagnosis not present

## 2018-11-21 DIAGNOSIS — M419 Scoliosis, unspecified: Secondary | ICD-10-CM | POA: Diagnosis not present

## 2018-11-21 DIAGNOSIS — M549 Dorsalgia, unspecified: Secondary | ICD-10-CM | POA: Diagnosis not present

## 2018-11-21 DIAGNOSIS — I69320 Aphasia following cerebral infarction: Secondary | ICD-10-CM | POA: Diagnosis not present

## 2018-11-21 DIAGNOSIS — G8929 Other chronic pain: Secondary | ICD-10-CM | POA: Diagnosis not present

## 2018-11-21 DIAGNOSIS — E7800 Pure hypercholesterolemia, unspecified: Secondary | ICD-10-CM | POA: Diagnosis not present

## 2018-11-21 DIAGNOSIS — I2510 Atherosclerotic heart disease of native coronary artery without angina pectoris: Secondary | ICD-10-CM | POA: Diagnosis not present

## 2018-11-21 DIAGNOSIS — Z7401 Bed confinement status: Secondary | ICD-10-CM | POA: Diagnosis not present

## 2018-11-21 DIAGNOSIS — L89313 Pressure ulcer of right buttock, stage 3: Secondary | ICD-10-CM | POA: Diagnosis not present

## 2018-11-21 DIAGNOSIS — I69351 Hemiplegia and hemiparesis following cerebral infarction affecting right dominant side: Secondary | ICD-10-CM | POA: Diagnosis not present

## 2018-11-21 DIAGNOSIS — Z466 Encounter for fitting and adjustment of urinary device: Secondary | ICD-10-CM | POA: Diagnosis not present

## 2018-11-21 DIAGNOSIS — I252 Old myocardial infarction: Secondary | ICD-10-CM | POA: Diagnosis not present

## 2018-11-21 NOTE — Progress Notes (Signed)
Michelle Holmes, Michelle Holmes ([id number]) Visit Report for 11/17/2018 Arrival Information Details Patient Name: Date of Service: Michelle Holmes, Michelle Holmes 11/17/2018 12:30 PM Medical Record [id number] Patient Account Number: [id number] Date of Birth/Sex: Treating RN: 08-04-1950 (68 y.o. Nancy Fetter Primary Care Reginae Wolfrey: Earlene Plater Other Clinician: Referring  Ducre: Treating Senta Kantor/Extender:Robson, Alvin Critchley, Jasmine Pang in Treatment: 19 Visit Information History Since Last Visit Added or deleted any medications: No Patient Arrived: Wheel Chair Any new allergies or adverse reactions: No Arrival Time: 13:07 Had a fall or experienced change in No activities of daily living that may affect Accompanied By: daughter risk of falls: Transfer Assistance: [employer] Signs or symptoms of abuse/neglect since last No Patient Identification Verified: Yes visito Secondary Verification Process Completed: Yes Hospitalized since last visit: No Patient Requires Transmission-Based No Implantable device outside of the clinic excluding No Precautions: cellular tissue based products placed in the center Patient Has Alerts: No since last visit: Has Dressing in Place as Prescribed: Yes Pain Present Now: No Electronic Signature(s) Signed: 11/18/2018 5:59:58 PM By: Levan Hurst RN, BSN Entered By: Levan Hurst on 11/17/2018 13:09:18 -------------------------------------------------------------------------------- Encounter Discharge Information Details Patient Name: Date of Service: Michelle Holmes 11/17/2018 12:30 PM Medical Record [id number] Patient Account Number: [id number] Date of Birth/Sex: Treating RN: 1950-10-07 (68 y.o. Clearnce Sorrel Primary Care Meshach Perry: Earlene Plater Other Clinician: Referring Lexii Walsh: Treating Lalania Haseman/Extender:Robson, Alvin Critchley, Jasmine Pang in Treatment: 19 Encounter Discharge Information Items Discharge Condition:  Stable Ambulatory Status: Wheelchair Discharge Destination: Home Transportation: Other Accompanied By: family member Schedule Follow-up Appointment: Yes Clinical Summary of Care: Patient Declined Electronic Signature(s) Signed: 11/21/2018 5:21:50 PM By: Kela Millin Entered By: Kela Millin on 11/17/2018 14:01:10 -------------------------------------------------------------------------------- Multi Wound Chart Details Patient Name: Date of Service: Michelle Holmes 11/17/2018 12:30 PM Medical Record [id number] Patient Account Number: [id number] Date of Birth/Sex: Treating RN: 02-18-1950 (68 y.o. [name], Meta.Reding Primary Care Min Tunnell: Earlene Plater Other Clinician: Referring Ali Mohl: Treating Fin Hupp/Extender:Robson, Alvin Critchley, ANDREW Weeks in Treatment: 19 Vital Signs Height(in): 69 Pulse(bpm): 42 Weight(lbs): 142 Blood Pressure(mmHg): 160/70 Body Mass Index(BMI): 21 Temperature(F): 98.0 Respiratory 18 Rate(breaths/min): Photos: [3:No Photos] [6:No Photos] [N/A:N/A] Wound Location: [3:Right Ischium] [6:Right Back] [N/A:N/A] Wounding Event: [3:Pressure Injury] [6:Gradually Appeared] [N/A:N/A] Primary Etiology: [3:Pressure Ulcer] [6:Abrasion] [N/A:N/A] Comorbid History: [3:Anemia, Chronic Obstructive Pulmonary Disease (COPD), Sleep Apnea, Congestive Heart Failure, Coronary Artery Disease, Deep Vein Thrombosis, Hypertension, Myocardial Infarction, Type II Diabetes, Received Chemotherapy, Received  Radiation, Confinement Anxiety] [6:Anemia, Chronic Obstructive Pulmonary Disease (COPD), Sleep Apnea, Congestive Heart Failure, Coronary Artery Disease, Deep Vein Thrombosis, Hypertension, Myocardial Infarction, Type II Diabetes, Received Chemotherapy,  Received Radiation, Confinement Anxiety] [N/A:N/A] Date Acquired: [3:06/09/2018] [6:10/17/2018] [N/A:N/A] Weeks of Treatment: [3:19] [6:4] [N/A:N/A] Wound Status: [3:Open] [6:Open] [N/A:N/A] Clustered Wound:  [3:Yes] [6:Yes] [N/A:N/A] Clustered Quantity: [3:1] [6:N/A] [N/A:N/A] Measurements L x W x D 0.2x0.2x2.7 [6:1x1x0.1] [N/A:N/A] (cm) Area (cm) : [3:0.031] [6:0.785] [N/A:N/A] Volume (cm) : [3:0.085] [6:0.079] [N/A:N/A] % Reduction in Area: [3:98.50%] [6:32.00%] [N/A:N/A] % Reduction in Volume: [3:94.20%] [6:31.30%] [N/A:N/A] Classification: [3:Category/Stage III] [6:Full Thickness Without Exposed Support Structures] [N/A:N/A] Exudate Amount: [3:Medium] [6:Medium] [N/A:N/A] Exudate Type: [3:Serosanguineous] [6:Serosanguineous] [N/A:N/A] Exudate Color: [3:red, brown] [6:red, brown] [N/A:N/A] Wound Margin: [3:Well defined, not attached Flat and Intact] [N/A:N/A] Granulation Amount: [3:Large (67-100%)] [6:Large (67-100%)] [N/A:N/A] Granulation Quality: [3:Pink] [6:Red] [N/A:N/A] Necrotic Amount: [3:None Present (0%)] [6:None Present (0%)] [N/A:N/A] Exposed Structures: [3:Fat Layer (Subcutaneous Fat Layer (Subcutaneous N/A Tissue) Exposed: Yes Fascia: No Tendon: No Muscle: No Joint: No Bone: No Large (67-100%)] [6:Tissue) Exposed: Yes Fascia: No Tendon: No  Muscle: No Joint: No Bone: No Medium (34-66%)] [N/A:N/A] Treatment Notes Electronic Signature(s) Signed: 11/17/2018 6:29:04 PM By: Linton Ham MD Signed: 11/17/2018 6:35:09 PM By: Deon Pilling Entered By: Linton Ham on 11/17/2018 13:36:35 -------------------------------------------------------------------------------- Multi-Disciplinary Care Plan Details Patient Name: Date of Service: Michelle Holmes, Michelle Holmes 11/17/2018 12:30 PM Medical Record [id number] Patient Account Number: [id number] Date of Birth/Sex: Treating RN: January 09, 1951 (68 y.o. Debby Bud Primary Care Casey Fye: Earlene Plater Other Clinician: Referring Loie Jahr: Treating Eain Mullendore/Extender:Robson, Alvin Critchley, ANDREW Weeks in Treatment: 19 Active Inactive Wound/Skin Impairment Nursing Diagnoses: Knowledge deficit related to ulceration/compromised  skin integrity Goals: Patient/caregiver will verbalize understanding of skin care regimen Date Initiated: 07/07/2018 Target Resolution Date: 11/25/2018 Goal Status: Active Interventions: Assess patient/caregiver ability to obtain necessary supplies Assess patient/caregiver ability to perform ulcer/skin care regimen upon admission and as needed Provide education on ulcer and skin care Treatment Activities: Skin care regimen initiated : 07/07/2018 Topical wound management initiated : 07/07/2018 Notes: Electronic Signature(s) Signed: 11/17/2018 6:35:09 PM By: Deon Pilling Entered By: Deon Pilling on 11/17/2018 13:15:29 -------------------------------------------------------------------------------- Pain Assessment Details Patient Name: Date of Service: Michelle Holmes, Michelle Holmes 11/17/2018 12:30 PM Medical Record [id number] Patient Account Number: [id number] Date of Birth/Sex: Treating RN: [birth year] (68 y.o. Nancy Fetter Primary Care Keigo Whalley: Earlene Plater Other Clinician: Referring Letti Towell: Treating Ernestina Joe/Extender:Robson, Alvin Critchley, ANDREW Weeks in Treatment: 19 Active Problems Location of Pain Severity and Description of Pain Patient Has Paino No Site Locations Pain Management and Medication Current Pain Management: Electronic Signature(s) Signed: 11/18/2018 5:59:58 PM By: Levan Hurst RN, BSN Entered By: Levan Hurst on 11/17/2018 13:09:26 -------------------------------------------------------------------------------- Patient/Caregiver Education Details Patient Name: Date of Service: Michelle Holmes 10/22/2020andnbsp12:30 PM Medical Record [id number] Patient Account Number: [id number] Date of Birth/Gender: Treating RN: 08/09/1950 (68 y.o. Debby Bud Primary Care Physician: Earlene Plater Other Clinician: Referring Physician: Treating Physician/Extender:Robson, Alvin Critchley, Jasmine Pang in Treatment: 21 Education  Assessment Education Provided To: Caregiver Education Topics Provided Wound/Skin Impairment: Handouts: Caring for Your Ulcer Methods: Explain/Verbal Responses: Reinforcements needed Electronic Signature(s) Signed: 11/17/2018 6:35:09 PM By: Deon Pilling Entered By: Deon Pilling on 11/17/2018 13:15:40 -------------------------------------------------------------------------------- Wound Assessment Details Patient Name: Date of Service: Michelle Holmes, Michelle Holmes 11/17/2018 12:30 PM Medical Record [id number] Patient Account Number: [id number] Date of Birth/Sex: Treating RN: [birth year] (68 y.o. Nancy Fetter Primary Care Magdalen Cabana: Earlene Plater Other Clinician: Referring Dakarri Kessinger: Treating Alsie Younes/Extender:Robson, Alvin Critchley, ANDREW Weeks in Treatment: 19 Wound Status Wound Number: 3 Primary Pressure Ulcer Etiology: Wound Location: Right Ischium Wound Open Wounding Event: Pressure Injury Status: Date Acquired: 06/09/2018 Comorbid Anemia, Chronic Obstructive Pulmonary Weeks Of Treatment: 19 History: Disease (COPD), Sleep Apnea, Congestive Clustered Wound: Yes Heart Failure, Coronary Artery Disease, Deep Vein Thrombosis, Hypertension, Myocardial Infarction, Type II Diabetes, Received Chemotherapy, Received Radiation, Confinement Anxiety Wound Measurements Length: (cm) 0.2 % Redu Width: (cm) 0.2 % Redu Depth: (cm) 2.7 Epithe Clustered Quantity: 1 Tunnel Area: (cm) 0.031 Volume: (cm) 0.085 Wound Description Classification: Category/Stage III Wound Margin: Well defined, not attached Exudate Amount: Medium Exudate Type: Serosanguineous Exudate Color: red, brown Wound Bed Granulation Amount: Large (67-100%) Granulation Quality: Pink Necrotic Amount: None Present (0%) Foul Odor After Cleansing: No Slough/Fibrino No Exposed Structure Fascia Exposed: No Fat Layer (Subcutaneous Tissue) Exposed: Yes Tendon Exposed: No Muscle Exposed: No Joint Exposed:  No Bone Exposed: No ction in Area: 98.5% ction in Volume: 94.2% lialization: Large (67-100%) ing: No Treatment Notes Wound #3 (Right Ischium) 1. Cleanse With Wound Cleanser 2. Periwound Care Barrier cream 3. Primary Dressing Applied Iodoflex 4. Secondary Dressing  ABD Pad Dry Gauze Foam 5. Secured With Tape Notes barrier cream/foam/abd/tape to back and iodoflex/4x4/foam to ischial Electronic Signature(s) Signed: 11/18/2018 5:59:58 PM By: Levan Hurst RN, BSN Entered By: Levan Hurst on 11/17/2018 13:10:04 -------------------------------------------------------------------------------- Wound Assessment Details Patient Name: Date of Service: Michelle Holmes, Michelle Holmes 11/17/2018 12:30 PM Medical Record [id number] Patient Account Number: [id number] Date of Birth/Sex: Treating RN: 10/04/1950 (68 y.o. Nancy Fetter Primary Care Dinah Lupa: Earlene Plater Other Clinician: Referring Tesa Meadors: Treating Chisom Aust/Extender:Robson, Alvin Critchley, ANDREW Weeks in Treatment: 19 Wound Status Wound Number: 6 Primary Abrasion Etiology: Wound Location: Right Back Wound Open Wounding Event: Gradually Appeared Status: Date Acquired: 10/17/2018 Comorbid Anemia, Chronic Obstructive Pulmonary Weeks Of Treatment: 4 History: Disease (COPD), Sleep Apnea, Congestive Clustered Wound: Yes Heart Failure, Coronary Artery Disease, Deep Vein Thrombosis, Hypertension, Myocardial Infarction, Type II Diabetes, Received Chemotherapy, Received Radiation, Confinement Anxiety Wound Measurements Length: (cm) 1 % Reduct Width: (cm) 1 % Reduct Depth: (cm) 0.1 Epitheli Area: (cm) 0.785 Tunneli Volume: (cm) 0.079 Undermi Wound Description Classification: Full Thickness Without Exposed Support Foul Odo Structures Slough/F Wound Flat and Intact Margin: Exudate Medium Amount: Exudate Serosanguineous Type: Exudate red, brown Color: Wound Bed Granulation Amount: Large  (67-100%) Granulation Quality: Red Fascia E Necrotic Amount: None Present (0%) Fat Laye Tendon E Muscle E Joint Ex Bone Exp r After Cleansing: No ibrino Yes Exposed Structure xposed: No r (Subcutaneous Tissue) Exposed: Yes xposed: No xposed: No posed: No osed: No ion in Area: 32% ion in Volume: 31.3% alization: Medium (34-66%) ng: No ning: No Treatment Notes Wound #6 (Right Back) 1. Cleanse With Wound Cleanser 2. Periwound Care Barrier cream 3. Primary Dressing Applied Iodoflex 4. Secondary Dressing ABD Pad Dry Gauze Foam 5. Secured With Tape Notes barrier cream/foam/abd/tape to back and iodoflex/4x4/foam to ischial Electronic Signature(s) Signed: 11/18/2018 5:59:58 PM By: Levan Hurst RN, BSN Entered By: Levan Hurst on 11/17/2018 13:10:18 -------------------------------------------------------------------------------- Vitals Details Patient Name: Date of Service: Michelle Holmes. 11/17/2018 12:30 PM Medical Record [id number] Patient Account Number: [id number] Date of Birth/Sex: Treating RN: Apr 26, 1950 (68 y.o. Nancy Fetter Primary Care Rada Zegers: Earlene Plater Other Clinician: Referring Joie Hipps: Treating Laelynn Blizzard/Extender:Robson, Alvin Critchley, ANDREW Weeks in Treatment: 19 Vital Signs Time Taken: 13:08 Temperature (F): 98.0 Height (in): 69 Pulse (bpm): 81 Weight (lbs): 142 Respiratory Rate (breaths/min): 18 Body Mass Index (BMI): 21 Blood Pressure (mmHg): 160/70 Reference Range: 80 - 120 mg / dl Electronic Signature(s) Signed: 11/18/2018 5:59:58 PM By: Levan Hurst RN, BSN Entered By: Levan Hurst on 11/17/2018 13:08:57

## 2018-11-22 DIAGNOSIS — J449 Chronic obstructive pulmonary disease, unspecified: Secondary | ICD-10-CM | POA: Diagnosis not present

## 2018-11-23 DIAGNOSIS — Z466 Encounter for fitting and adjustment of urinary device: Secondary | ICD-10-CM | POA: Diagnosis not present

## 2018-11-23 DIAGNOSIS — Z87440 Personal history of urinary (tract) infections: Secondary | ICD-10-CM | POA: Diagnosis not present

## 2018-11-23 DIAGNOSIS — I69391 Dysphagia following cerebral infarction: Secondary | ICD-10-CM | POA: Diagnosis not present

## 2018-11-23 DIAGNOSIS — I130 Hypertensive heart and chronic kidney disease with heart failure and stage 1 through stage 4 chronic kidney disease, or unspecified chronic kidney disease: Secondary | ICD-10-CM | POA: Diagnosis not present

## 2018-11-23 DIAGNOSIS — N139 Obstructive and reflux uropathy, unspecified: Secondary | ICD-10-CM | POA: Diagnosis not present

## 2018-11-23 DIAGNOSIS — G8929 Other chronic pain: Secondary | ICD-10-CM | POA: Diagnosis not present

## 2018-11-23 DIAGNOSIS — E1122 Type 2 diabetes mellitus with diabetic chronic kidney disease: Secondary | ICD-10-CM | POA: Diagnosis not present

## 2018-11-23 DIAGNOSIS — M549 Dorsalgia, unspecified: Secondary | ICD-10-CM | POA: Diagnosis not present

## 2018-11-23 DIAGNOSIS — I252 Old myocardial infarction: Secondary | ICD-10-CM | POA: Diagnosis not present

## 2018-11-23 DIAGNOSIS — I2510 Atherosclerotic heart disease of native coronary artery without angina pectoris: Secondary | ICD-10-CM | POA: Diagnosis not present

## 2018-11-23 DIAGNOSIS — E7800 Pure hypercholesterolemia, unspecified: Secondary | ICD-10-CM | POA: Diagnosis not present

## 2018-11-23 DIAGNOSIS — Z7401 Bed confinement status: Secondary | ICD-10-CM | POA: Diagnosis not present

## 2018-11-23 DIAGNOSIS — D631 Anemia in chronic kidney disease: Secondary | ICD-10-CM | POA: Diagnosis not present

## 2018-11-23 DIAGNOSIS — I5032 Chronic diastolic (congestive) heart failure: Secondary | ICD-10-CM | POA: Diagnosis not present

## 2018-11-23 DIAGNOSIS — L89313 Pressure ulcer of right buttock, stage 3: Secondary | ICD-10-CM | POA: Diagnosis not present

## 2018-11-23 DIAGNOSIS — I69320 Aphasia following cerebral infarction: Secondary | ICD-10-CM | POA: Diagnosis not present

## 2018-11-23 DIAGNOSIS — E43 Unspecified severe protein-calorie malnutrition: Secondary | ICD-10-CM | POA: Diagnosis not present

## 2018-11-23 DIAGNOSIS — R1310 Dysphagia, unspecified: Secondary | ICD-10-CM | POA: Diagnosis not present

## 2018-11-23 DIAGNOSIS — M419 Scoliosis, unspecified: Secondary | ICD-10-CM | POA: Diagnosis not present

## 2018-11-23 DIAGNOSIS — G4733 Obstructive sleep apnea (adult) (pediatric): Secondary | ICD-10-CM | POA: Diagnosis not present

## 2018-11-23 DIAGNOSIS — N184 Chronic kidney disease, stage 4 (severe): Secondary | ICD-10-CM | POA: Diagnosis not present

## 2018-11-23 DIAGNOSIS — I69351 Hemiplegia and hemiparesis following cerebral infarction affecting right dominant side: Secondary | ICD-10-CM | POA: Diagnosis not present

## 2018-11-23 DIAGNOSIS — J439 Emphysema, unspecified: Secondary | ICD-10-CM | POA: Diagnosis not present

## 2018-11-23 DIAGNOSIS — M1990 Unspecified osteoarthritis, unspecified site: Secondary | ICD-10-CM | POA: Diagnosis not present

## 2018-11-25 DIAGNOSIS — M549 Dorsalgia, unspecified: Secondary | ICD-10-CM | POA: Diagnosis not present

## 2018-11-25 DIAGNOSIS — N184 Chronic kidney disease, stage 4 (severe): Secondary | ICD-10-CM | POA: Diagnosis not present

## 2018-11-25 DIAGNOSIS — I69351 Hemiplegia and hemiparesis following cerebral infarction affecting right dominant side: Secondary | ICD-10-CM | POA: Diagnosis not present

## 2018-11-25 DIAGNOSIS — I69320 Aphasia following cerebral infarction: Secondary | ICD-10-CM | POA: Diagnosis not present

## 2018-11-25 DIAGNOSIS — Z87440 Personal history of urinary (tract) infections: Secondary | ICD-10-CM | POA: Diagnosis not present

## 2018-11-25 DIAGNOSIS — J439 Emphysema, unspecified: Secondary | ICD-10-CM | POA: Diagnosis not present

## 2018-11-25 DIAGNOSIS — G4733 Obstructive sleep apnea (adult) (pediatric): Secondary | ICD-10-CM | POA: Diagnosis not present

## 2018-11-25 DIAGNOSIS — I2510 Atherosclerotic heart disease of native coronary artery without angina pectoris: Secondary | ICD-10-CM | POA: Diagnosis not present

## 2018-11-25 DIAGNOSIS — G8929 Other chronic pain: Secondary | ICD-10-CM | POA: Diagnosis not present

## 2018-11-25 DIAGNOSIS — E7800 Pure hypercholesterolemia, unspecified: Secondary | ICD-10-CM | POA: Diagnosis not present

## 2018-11-25 DIAGNOSIS — I5032 Chronic diastolic (congestive) heart failure: Secondary | ICD-10-CM | POA: Diagnosis not present

## 2018-11-25 DIAGNOSIS — E43 Unspecified severe protein-calorie malnutrition: Secondary | ICD-10-CM | POA: Diagnosis not present

## 2018-11-25 DIAGNOSIS — I252 Old myocardial infarction: Secondary | ICD-10-CM | POA: Diagnosis not present

## 2018-11-25 DIAGNOSIS — L89313 Pressure ulcer of right buttock, stage 3: Secondary | ICD-10-CM | POA: Diagnosis not present

## 2018-11-25 DIAGNOSIS — R1310 Dysphagia, unspecified: Secondary | ICD-10-CM | POA: Diagnosis not present

## 2018-11-25 DIAGNOSIS — I69391 Dysphagia following cerebral infarction: Secondary | ICD-10-CM | POA: Diagnosis not present

## 2018-11-25 DIAGNOSIS — Z466 Encounter for fitting and adjustment of urinary device: Secondary | ICD-10-CM | POA: Diagnosis not present

## 2018-11-25 DIAGNOSIS — M419 Scoliosis, unspecified: Secondary | ICD-10-CM | POA: Diagnosis not present

## 2018-11-25 DIAGNOSIS — D631 Anemia in chronic kidney disease: Secondary | ICD-10-CM | POA: Diagnosis not present

## 2018-11-25 DIAGNOSIS — N139 Obstructive and reflux uropathy, unspecified: Secondary | ICD-10-CM | POA: Diagnosis not present

## 2018-11-25 DIAGNOSIS — Z7401 Bed confinement status: Secondary | ICD-10-CM | POA: Diagnosis not present

## 2018-11-25 DIAGNOSIS — M1990 Unspecified osteoarthritis, unspecified site: Secondary | ICD-10-CM | POA: Diagnosis not present

## 2018-11-25 DIAGNOSIS — I130 Hypertensive heart and chronic kidney disease with heart failure and stage 1 through stage 4 chronic kidney disease, or unspecified chronic kidney disease: Secondary | ICD-10-CM | POA: Diagnosis not present

## 2018-11-25 DIAGNOSIS — E1122 Type 2 diabetes mellitus with diabetic chronic kidney disease: Secondary | ICD-10-CM | POA: Diagnosis not present

## 2018-11-28 DIAGNOSIS — Z87440 Personal history of urinary (tract) infections: Secondary | ICD-10-CM | POA: Diagnosis not present

## 2018-11-28 DIAGNOSIS — E1122 Type 2 diabetes mellitus with diabetic chronic kidney disease: Secondary | ICD-10-CM | POA: Diagnosis not present

## 2018-11-28 DIAGNOSIS — Z792 Long term (current) use of antibiotics: Secondary | ICD-10-CM | POA: Diagnosis not present

## 2018-11-28 DIAGNOSIS — M549 Dorsalgia, unspecified: Secondary | ICD-10-CM | POA: Diagnosis not present

## 2018-11-28 DIAGNOSIS — M1990 Unspecified osteoarthritis, unspecified site: Secondary | ICD-10-CM | POA: Diagnosis not present

## 2018-11-28 DIAGNOSIS — G8111 Spastic hemiplegia affecting right dominant side: Secondary | ICD-10-CM | POA: Diagnosis not present

## 2018-11-28 DIAGNOSIS — M419 Scoliosis, unspecified: Secondary | ICD-10-CM | POA: Diagnosis not present

## 2018-11-28 DIAGNOSIS — G4733 Obstructive sleep apnea (adult) (pediatric): Secondary | ICD-10-CM | POA: Diagnosis not present

## 2018-11-28 DIAGNOSIS — I5032 Chronic diastolic (congestive) heart failure: Secondary | ICD-10-CM | POA: Diagnosis not present

## 2018-11-28 DIAGNOSIS — G8929 Other chronic pain: Secondary | ICD-10-CM | POA: Diagnosis not present

## 2018-11-28 DIAGNOSIS — E43 Unspecified severe protein-calorie malnutrition: Secondary | ICD-10-CM | POA: Diagnosis not present

## 2018-11-28 DIAGNOSIS — L89313 Pressure ulcer of right buttock, stage 3: Secondary | ICD-10-CM | POA: Diagnosis not present

## 2018-11-28 DIAGNOSIS — N139 Obstructive and reflux uropathy, unspecified: Secondary | ICD-10-CM | POA: Diagnosis not present

## 2018-11-28 DIAGNOSIS — M150 Primary generalized (osteo)arthritis: Secondary | ICD-10-CM | POA: Diagnosis not present

## 2018-11-28 DIAGNOSIS — I639 Cerebral infarction, unspecified: Secondary | ICD-10-CM | POA: Diagnosis not present

## 2018-11-28 DIAGNOSIS — N184 Chronic kidney disease, stage 4 (severe): Secondary | ICD-10-CM | POA: Diagnosis not present

## 2018-11-28 DIAGNOSIS — Z7401 Bed confinement status: Secondary | ICD-10-CM | POA: Diagnosis not present

## 2018-11-28 DIAGNOSIS — J439 Emphysema, unspecified: Secondary | ICD-10-CM | POA: Diagnosis not present

## 2018-11-28 DIAGNOSIS — R1310 Dysphagia, unspecified: Secondary | ICD-10-CM | POA: Diagnosis not present

## 2018-11-28 DIAGNOSIS — I69351 Hemiplegia and hemiparesis following cerebral infarction affecting right dominant side: Secondary | ICD-10-CM | POA: Diagnosis not present

## 2018-11-28 DIAGNOSIS — Z466 Encounter for fitting and adjustment of urinary device: Secondary | ICD-10-CM | POA: Diagnosis not present

## 2018-11-28 DIAGNOSIS — I130 Hypertensive heart and chronic kidney disease with heart failure and stage 1 through stage 4 chronic kidney disease, or unspecified chronic kidney disease: Secondary | ICD-10-CM | POA: Diagnosis not present

## 2018-11-28 DIAGNOSIS — I69391 Dysphagia following cerebral infarction: Secondary | ICD-10-CM | POA: Diagnosis not present

## 2018-11-28 DIAGNOSIS — I252 Old myocardial infarction: Secondary | ICD-10-CM | POA: Diagnosis not present

## 2018-11-28 DIAGNOSIS — I2510 Atherosclerotic heart disease of native coronary artery without angina pectoris: Secondary | ICD-10-CM | POA: Diagnosis not present

## 2018-11-28 DIAGNOSIS — E7800 Pure hypercholesterolemia, unspecified: Secondary | ICD-10-CM | POA: Diagnosis not present

## 2018-11-28 DIAGNOSIS — D631 Anemia in chronic kidney disease: Secondary | ICD-10-CM | POA: Diagnosis not present

## 2018-11-28 DIAGNOSIS — I69320 Aphasia following cerebral infarction: Secondary | ICD-10-CM | POA: Diagnosis not present

## 2018-11-30 DIAGNOSIS — N184 Chronic kidney disease, stage 4 (severe): Secondary | ICD-10-CM | POA: Diagnosis not present

## 2018-11-30 DIAGNOSIS — R1310 Dysphagia, unspecified: Secondary | ICD-10-CM | POA: Diagnosis not present

## 2018-11-30 DIAGNOSIS — M549 Dorsalgia, unspecified: Secondary | ICD-10-CM | POA: Diagnosis not present

## 2018-11-30 DIAGNOSIS — D631 Anemia in chronic kidney disease: Secondary | ICD-10-CM | POA: Diagnosis not present

## 2018-11-30 DIAGNOSIS — G8929 Other chronic pain: Secondary | ICD-10-CM | POA: Diagnosis not present

## 2018-11-30 DIAGNOSIS — I130 Hypertensive heart and chronic kidney disease with heart failure and stage 1 through stage 4 chronic kidney disease, or unspecified chronic kidney disease: Secondary | ICD-10-CM | POA: Diagnosis not present

## 2018-11-30 DIAGNOSIS — I69391 Dysphagia following cerebral infarction: Secondary | ICD-10-CM | POA: Diagnosis not present

## 2018-11-30 DIAGNOSIS — N139 Obstructive and reflux uropathy, unspecified: Secondary | ICD-10-CM | POA: Diagnosis not present

## 2018-11-30 DIAGNOSIS — Z87440 Personal history of urinary (tract) infections: Secondary | ICD-10-CM | POA: Diagnosis not present

## 2018-11-30 DIAGNOSIS — I5032 Chronic diastolic (congestive) heart failure: Secondary | ICD-10-CM | POA: Diagnosis not present

## 2018-11-30 DIAGNOSIS — I252 Old myocardial infarction: Secondary | ICD-10-CM | POA: Diagnosis not present

## 2018-11-30 DIAGNOSIS — E43 Unspecified severe protein-calorie malnutrition: Secondary | ICD-10-CM | POA: Diagnosis not present

## 2018-11-30 DIAGNOSIS — I2510 Atherosclerotic heart disease of native coronary artery without angina pectoris: Secondary | ICD-10-CM | POA: Diagnosis not present

## 2018-11-30 DIAGNOSIS — G4733 Obstructive sleep apnea (adult) (pediatric): Secondary | ICD-10-CM | POA: Diagnosis not present

## 2018-11-30 DIAGNOSIS — J439 Emphysema, unspecified: Secondary | ICD-10-CM | POA: Diagnosis not present

## 2018-11-30 DIAGNOSIS — E7800 Pure hypercholesterolemia, unspecified: Secondary | ICD-10-CM | POA: Diagnosis not present

## 2018-11-30 DIAGNOSIS — M1990 Unspecified osteoarthritis, unspecified site: Secondary | ICD-10-CM | POA: Diagnosis not present

## 2018-11-30 DIAGNOSIS — I69351 Hemiplegia and hemiparesis following cerebral infarction affecting right dominant side: Secondary | ICD-10-CM | POA: Diagnosis not present

## 2018-11-30 DIAGNOSIS — I69320 Aphasia following cerebral infarction: Secondary | ICD-10-CM | POA: Diagnosis not present

## 2018-11-30 DIAGNOSIS — M419 Scoliosis, unspecified: Secondary | ICD-10-CM | POA: Diagnosis not present

## 2018-11-30 DIAGNOSIS — L89313 Pressure ulcer of right buttock, stage 3: Secondary | ICD-10-CM | POA: Diagnosis not present

## 2018-11-30 DIAGNOSIS — E1122 Type 2 diabetes mellitus with diabetic chronic kidney disease: Secondary | ICD-10-CM | POA: Diagnosis not present

## 2018-11-30 DIAGNOSIS — Z466 Encounter for fitting and adjustment of urinary device: Secondary | ICD-10-CM | POA: Diagnosis not present

## 2018-11-30 DIAGNOSIS — Z7401 Bed confinement status: Secondary | ICD-10-CM | POA: Diagnosis not present

## 2018-12-01 ENCOUNTER — Telehealth: Payer: Self-pay | Admitting: *Deleted

## 2018-12-01 ENCOUNTER — Ambulatory Visit (HOSPITAL_BASED_OUTPATIENT_CLINIC_OR_DEPARTMENT_OTHER): Payer: Medicare Other | Admitting: Internal Medicine

## 2018-12-01 NOTE — Telephone Encounter (Signed)
Per lab work dr Dareen Piano wants pt to be seen asap, pt has appt 11/12 [place]

## 2018-12-02 DIAGNOSIS — M419 Scoliosis, unspecified: Secondary | ICD-10-CM | POA: Diagnosis not present

## 2018-12-02 DIAGNOSIS — J439 Emphysema, unspecified: Secondary | ICD-10-CM | POA: Diagnosis not present

## 2018-12-02 DIAGNOSIS — R1310 Dysphagia, unspecified: Secondary | ICD-10-CM | POA: Diagnosis not present

## 2018-12-02 DIAGNOSIS — L89313 Pressure ulcer of right buttock, stage 3: Secondary | ICD-10-CM | POA: Diagnosis not present

## 2018-12-02 DIAGNOSIS — E43 Unspecified severe protein-calorie malnutrition: Secondary | ICD-10-CM | POA: Diagnosis not present

## 2018-12-02 DIAGNOSIS — I69320 Aphasia following cerebral infarction: Secondary | ICD-10-CM | POA: Diagnosis not present

## 2018-12-02 DIAGNOSIS — E7800 Pure hypercholesterolemia, unspecified: Secondary | ICD-10-CM | POA: Diagnosis not present

## 2018-12-02 DIAGNOSIS — I130 Hypertensive heart and chronic kidney disease with heart failure and stage 1 through stage 4 chronic kidney disease, or unspecified chronic kidney disease: Secondary | ICD-10-CM | POA: Diagnosis not present

## 2018-12-02 DIAGNOSIS — N139 Obstructive and reflux uropathy, unspecified: Secondary | ICD-10-CM | POA: Diagnosis not present

## 2018-12-02 DIAGNOSIS — I2510 Atherosclerotic heart disease of native coronary artery without angina pectoris: Secondary | ICD-10-CM | POA: Diagnosis not present

## 2018-12-02 DIAGNOSIS — I69391 Dysphagia following cerebral infarction: Secondary | ICD-10-CM | POA: Diagnosis not present

## 2018-12-02 DIAGNOSIS — M1990 Unspecified osteoarthritis, unspecified site: Secondary | ICD-10-CM | POA: Diagnosis not present

## 2018-12-02 DIAGNOSIS — I69351 Hemiplegia and hemiparesis following cerebral infarction affecting right dominant side: Secondary | ICD-10-CM | POA: Diagnosis not present

## 2018-12-02 DIAGNOSIS — Z466 Encounter for fitting and adjustment of urinary device: Secondary | ICD-10-CM | POA: Diagnosis not present

## 2018-12-02 DIAGNOSIS — D631 Anemia in chronic kidney disease: Secondary | ICD-10-CM | POA: Diagnosis not present

## 2018-12-02 DIAGNOSIS — M549 Dorsalgia, unspecified: Secondary | ICD-10-CM | POA: Diagnosis not present

## 2018-12-02 DIAGNOSIS — E1122 Type 2 diabetes mellitus with diabetic chronic kidney disease: Secondary | ICD-10-CM | POA: Diagnosis not present

## 2018-12-02 DIAGNOSIS — I5032 Chronic diastolic (congestive) heart failure: Secondary | ICD-10-CM | POA: Diagnosis not present

## 2018-12-02 DIAGNOSIS — Z87440 Personal history of urinary (tract) infections: Secondary | ICD-10-CM | POA: Diagnosis not present

## 2018-12-02 DIAGNOSIS — I252 Old myocardial infarction: Secondary | ICD-10-CM | POA: Diagnosis not present

## 2018-12-02 DIAGNOSIS — Z7401 Bed confinement status: Secondary | ICD-10-CM | POA: Diagnosis not present

## 2018-12-02 DIAGNOSIS — N184 Chronic kidney disease, stage 4 (severe): Secondary | ICD-10-CM | POA: Diagnosis not present

## 2018-12-02 DIAGNOSIS — G8929 Other chronic pain: Secondary | ICD-10-CM | POA: Diagnosis not present

## 2018-12-02 DIAGNOSIS — G4733 Obstructive sleep apnea (adult) (pediatric): Secondary | ICD-10-CM | POA: Diagnosis not present

## 2018-12-05 ENCOUNTER — Encounter (HOSPITAL_BASED_OUTPATIENT_CLINIC_OR_DEPARTMENT_OTHER): Payer: Medicare Other | Admitting: Internal Medicine

## 2018-12-05 DIAGNOSIS — R1310 Dysphagia, unspecified: Secondary | ICD-10-CM | POA: Diagnosis not present

## 2018-12-05 DIAGNOSIS — Z87440 Personal history of urinary (tract) infections: Secondary | ICD-10-CM | POA: Diagnosis not present

## 2018-12-05 DIAGNOSIS — N184 Chronic kidney disease, stage 4 (severe): Secondary | ICD-10-CM | POA: Diagnosis not present

## 2018-12-05 DIAGNOSIS — J439 Emphysema, unspecified: Secondary | ICD-10-CM | POA: Diagnosis not present

## 2018-12-05 DIAGNOSIS — I69320 Aphasia following cerebral infarction: Secondary | ICD-10-CM | POA: Diagnosis not present

## 2018-12-05 DIAGNOSIS — I130 Hypertensive heart and chronic kidney disease with heart failure and stage 1 through stage 4 chronic kidney disease, or unspecified chronic kidney disease: Secondary | ICD-10-CM | POA: Diagnosis not present

## 2018-12-05 DIAGNOSIS — I2510 Atherosclerotic heart disease of native coronary artery without angina pectoris: Secondary | ICD-10-CM | POA: Diagnosis not present

## 2018-12-05 DIAGNOSIS — E7800 Pure hypercholesterolemia, unspecified: Secondary | ICD-10-CM | POA: Diagnosis not present

## 2018-12-05 DIAGNOSIS — G8929 Other chronic pain: Secondary | ICD-10-CM | POA: Diagnosis not present

## 2018-12-05 DIAGNOSIS — I5032 Chronic diastolic (congestive) heart failure: Secondary | ICD-10-CM | POA: Diagnosis not present

## 2018-12-05 DIAGNOSIS — D631 Anemia in chronic kidney disease: Secondary | ICD-10-CM | POA: Diagnosis not present

## 2018-12-05 DIAGNOSIS — N139 Obstructive and reflux uropathy, unspecified: Secondary | ICD-10-CM | POA: Diagnosis not present

## 2018-12-05 DIAGNOSIS — I69351 Hemiplegia and hemiparesis following cerebral infarction affecting right dominant side: Secondary | ICD-10-CM | POA: Diagnosis not present

## 2018-12-05 DIAGNOSIS — M549 Dorsalgia, unspecified: Secondary | ICD-10-CM | POA: Diagnosis not present

## 2018-12-05 DIAGNOSIS — I69391 Dysphagia following cerebral infarction: Secondary | ICD-10-CM | POA: Diagnosis not present

## 2018-12-05 DIAGNOSIS — M419 Scoliosis, unspecified: Secondary | ICD-10-CM | POA: Diagnosis not present

## 2018-12-05 DIAGNOSIS — E43 Unspecified severe protein-calorie malnutrition: Secondary | ICD-10-CM | POA: Diagnosis not present

## 2018-12-05 DIAGNOSIS — E1122 Type 2 diabetes mellitus with diabetic chronic kidney disease: Secondary | ICD-10-CM | POA: Diagnosis not present

## 2018-12-05 DIAGNOSIS — I252 Old myocardial infarction: Secondary | ICD-10-CM | POA: Diagnosis not present

## 2018-12-05 DIAGNOSIS — G4733 Obstructive sleep apnea (adult) (pediatric): Secondary | ICD-10-CM | POA: Diagnosis not present

## 2018-12-05 DIAGNOSIS — Z7401 Bed confinement status: Secondary | ICD-10-CM | POA: Diagnosis not present

## 2018-12-05 DIAGNOSIS — L89313 Pressure ulcer of right buttock, stage 3: Secondary | ICD-10-CM | POA: Diagnosis not present

## 2018-12-05 DIAGNOSIS — M1990 Unspecified osteoarthritis, unspecified site: Secondary | ICD-10-CM | POA: Diagnosis not present

## 2018-12-05 DIAGNOSIS — Z466 Encounter for fitting and adjustment of urinary device: Secondary | ICD-10-CM | POA: Diagnosis not present

## 2018-12-07 DIAGNOSIS — J439 Emphysema, unspecified: Secondary | ICD-10-CM | POA: Diagnosis not present

## 2018-12-07 DIAGNOSIS — Z466 Encounter for fitting and adjustment of urinary device: Secondary | ICD-10-CM | POA: Diagnosis not present

## 2018-12-07 DIAGNOSIS — N184 Chronic kidney disease, stage 4 (severe): Secondary | ICD-10-CM | POA: Diagnosis not present

## 2018-12-07 DIAGNOSIS — M549 Dorsalgia, unspecified: Secondary | ICD-10-CM | POA: Diagnosis not present

## 2018-12-07 DIAGNOSIS — N139 Obstructive and reflux uropathy, unspecified: Secondary | ICD-10-CM | POA: Diagnosis not present

## 2018-12-07 DIAGNOSIS — I5032 Chronic diastolic (congestive) heart failure: Secondary | ICD-10-CM | POA: Diagnosis not present

## 2018-12-07 DIAGNOSIS — G8929 Other chronic pain: Secondary | ICD-10-CM | POA: Diagnosis not present

## 2018-12-07 DIAGNOSIS — E43 Unspecified severe protein-calorie malnutrition: Secondary | ICD-10-CM | POA: Diagnosis not present

## 2018-12-07 DIAGNOSIS — G4733 Obstructive sleep apnea (adult) (pediatric): Secondary | ICD-10-CM | POA: Diagnosis not present

## 2018-12-07 DIAGNOSIS — E1122 Type 2 diabetes mellitus with diabetic chronic kidney disease: Secondary | ICD-10-CM | POA: Diagnosis not present

## 2018-12-07 DIAGNOSIS — R1310 Dysphagia, unspecified: Secondary | ICD-10-CM | POA: Diagnosis not present

## 2018-12-07 DIAGNOSIS — I130 Hypertensive heart and chronic kidney disease with heart failure and stage 1 through stage 4 chronic kidney disease, or unspecified chronic kidney disease: Secondary | ICD-10-CM | POA: Diagnosis not present

## 2018-12-07 DIAGNOSIS — I69351 Hemiplegia and hemiparesis following cerebral infarction affecting right dominant side: Secondary | ICD-10-CM | POA: Diagnosis not present

## 2018-12-07 DIAGNOSIS — L89313 Pressure ulcer of right buttock, stage 3: Secondary | ICD-10-CM | POA: Diagnosis not present

## 2018-12-07 DIAGNOSIS — D631 Anemia in chronic kidney disease: Secondary | ICD-10-CM | POA: Diagnosis not present

## 2018-12-07 DIAGNOSIS — I69320 Aphasia following cerebral infarction: Secondary | ICD-10-CM | POA: Diagnosis not present

## 2018-12-07 DIAGNOSIS — M1990 Unspecified osteoarthritis, unspecified site: Secondary | ICD-10-CM | POA: Diagnosis not present

## 2018-12-07 DIAGNOSIS — M419 Scoliosis, unspecified: Secondary | ICD-10-CM | POA: Diagnosis not present

## 2018-12-07 DIAGNOSIS — Z7401 Bed confinement status: Secondary | ICD-10-CM | POA: Diagnosis not present

## 2018-12-07 DIAGNOSIS — I252 Old myocardial infarction: Secondary | ICD-10-CM | POA: Diagnosis not present

## 2018-12-07 DIAGNOSIS — Z87440 Personal history of urinary (tract) infections: Secondary | ICD-10-CM | POA: Diagnosis not present

## 2018-12-07 DIAGNOSIS — I69391 Dysphagia following cerebral infarction: Secondary | ICD-10-CM | POA: Diagnosis not present

## 2018-12-07 DIAGNOSIS — E7800 Pure hypercholesterolemia, unspecified: Secondary | ICD-10-CM | POA: Diagnosis not present

## 2018-12-07 DIAGNOSIS — I2510 Atherosclerotic heart disease of native coronary artery without angina pectoris: Secondary | ICD-10-CM | POA: Diagnosis not present

## 2018-12-08 ENCOUNTER — Ambulatory Visit: Payer: Medicare Other

## 2018-12-08 ENCOUNTER — Telehealth: Payer: Self-pay

## 2018-12-08 DIAGNOSIS — Z466 Encounter for fitting and adjustment of urinary device: Secondary | ICD-10-CM | POA: Diagnosis not present

## 2018-12-08 DIAGNOSIS — R339 Retention of urine, unspecified: Secondary | ICD-10-CM | POA: Diagnosis not present

## 2018-12-08 DIAGNOSIS — N319 Neuromuscular dysfunction of bladder, unspecified: Secondary | ICD-10-CM | POA: Diagnosis not present

## 2018-12-08 NOTE — Telephone Encounter (Signed)
Requesting oxycodone to be filled @  [facility], [place] [phone] (Phone) [phone] (Fax)

## 2018-12-09 DIAGNOSIS — I2510 Atherosclerotic heart disease of native coronary artery without angina pectoris: Secondary | ICD-10-CM | POA: Diagnosis not present

## 2018-12-09 DIAGNOSIS — D631 Anemia in chronic kidney disease: Secondary | ICD-10-CM | POA: Diagnosis not present

## 2018-12-09 DIAGNOSIS — G8929 Other chronic pain: Secondary | ICD-10-CM | POA: Diagnosis not present

## 2018-12-09 DIAGNOSIS — I5032 Chronic diastolic (congestive) heart failure: Secondary | ICD-10-CM | POA: Diagnosis not present

## 2018-12-09 DIAGNOSIS — R1310 Dysphagia, unspecified: Secondary | ICD-10-CM | POA: Diagnosis not present

## 2018-12-09 DIAGNOSIS — Z87440 Personal history of urinary (tract) infections: Secondary | ICD-10-CM | POA: Diagnosis not present

## 2018-12-09 DIAGNOSIS — M549 Dorsalgia, unspecified: Secondary | ICD-10-CM | POA: Diagnosis not present

## 2018-12-09 DIAGNOSIS — I69391 Dysphagia following cerebral infarction: Secondary | ICD-10-CM | POA: Diagnosis not present

## 2018-12-09 DIAGNOSIS — I69351 Hemiplegia and hemiparesis following cerebral infarction affecting right dominant side: Secondary | ICD-10-CM | POA: Diagnosis not present

## 2018-12-09 DIAGNOSIS — N139 Obstructive and reflux uropathy, unspecified: Secondary | ICD-10-CM | POA: Diagnosis not present

## 2018-12-09 DIAGNOSIS — E43 Unspecified severe protein-calorie malnutrition: Secondary | ICD-10-CM | POA: Diagnosis not present

## 2018-12-09 DIAGNOSIS — Z7401 Bed confinement status: Secondary | ICD-10-CM | POA: Diagnosis not present

## 2018-12-09 DIAGNOSIS — E1122 Type 2 diabetes mellitus with diabetic chronic kidney disease: Secondary | ICD-10-CM | POA: Diagnosis not present

## 2018-12-09 DIAGNOSIS — J439 Emphysema, unspecified: Secondary | ICD-10-CM | POA: Diagnosis not present

## 2018-12-09 DIAGNOSIS — I69320 Aphasia following cerebral infarction: Secondary | ICD-10-CM | POA: Diagnosis not present

## 2018-12-09 DIAGNOSIS — M1990 Unspecified osteoarthritis, unspecified site: Secondary | ICD-10-CM | POA: Diagnosis not present

## 2018-12-09 DIAGNOSIS — L89313 Pressure ulcer of right buttock, stage 3: Secondary | ICD-10-CM | POA: Diagnosis not present

## 2018-12-09 DIAGNOSIS — I252 Old myocardial infarction: Secondary | ICD-10-CM | POA: Diagnosis not present

## 2018-12-09 DIAGNOSIS — I130 Hypertensive heart and chronic kidney disease with heart failure and stage 1 through stage 4 chronic kidney disease, or unspecified chronic kidney disease: Secondary | ICD-10-CM | POA: Diagnosis not present

## 2018-12-09 DIAGNOSIS — Z466 Encounter for fitting and adjustment of urinary device: Secondary | ICD-10-CM | POA: Diagnosis not present

## 2018-12-09 DIAGNOSIS — G4733 Obstructive sleep apnea (adult) (pediatric): Secondary | ICD-10-CM | POA: Diagnosis not present

## 2018-12-09 DIAGNOSIS — N184 Chronic kidney disease, stage 4 (severe): Secondary | ICD-10-CM | POA: Diagnosis not present

## 2018-12-09 DIAGNOSIS — M419 Scoliosis, unspecified: Secondary | ICD-10-CM | POA: Diagnosis not present

## 2018-12-09 DIAGNOSIS — E7800 Pure hypercholesterolemia, unspecified: Secondary | ICD-10-CM | POA: Diagnosis not present

## 2018-12-09 NOTE — Telephone Encounter (Signed)
RTC to pt and spoke with her daughter Sherlyn Hay.  Her daughter is requesting refill on pain med, oxycodone which was already sent to pcp this morning.  Daughter states pt will run out of pain med over the weekend.  Will resend request to pcp, daughter also made aware to call earlier for refills, she verbalized understanding. SChaplin, RN,BSN

## 2018-12-09 NOTE — Telephone Encounter (Signed)
Pt daughter calling about pt pain medicine

## 2018-12-12 MED ORDER — OXYCODONE HCL 10 MG PO TABS: 10.0000 mg | ORAL_TABLET | Freq: Two times a day (BID) | ORAL | 0 refills | Status: DC | PRN

## 2018-12-12 NOTE — Telephone Encounter (Signed)
Have text pcp and paged pcp, sending to attending

## 2018-12-12 NOTE — Telephone Encounter (Signed)
Pls sch PCP appt next 90 days chronic pain mgmt. Needs new contract

## 2018-12-15 ENCOUNTER — Encounter (HOSPITAL_BASED_OUTPATIENT_CLINIC_OR_DEPARTMENT_OTHER): Payer: Medicare Other | Attending: Internal Medicine | Admitting: Internal Medicine

## 2018-12-15 ENCOUNTER — Other Ambulatory Visit: Payer: Self-pay

## 2018-12-15 DIAGNOSIS — D649 Anemia, unspecified: Secondary | ICD-10-CM | POA: Diagnosis not present

## 2018-12-15 DIAGNOSIS — I509 Heart failure, unspecified: Secondary | ICD-10-CM | POA: Diagnosis not present

## 2018-12-15 DIAGNOSIS — L89313 Pressure ulcer of right buttock, stage 3: Secondary | ICD-10-CM | POA: Diagnosis not present

## 2018-12-15 DIAGNOSIS — Z9221 Personal history of antineoplastic chemotherapy: Secondary | ICD-10-CM | POA: Insufficient documentation

## 2018-12-15 DIAGNOSIS — Z8673 Personal history of transient ischemic attack (TIA), and cerebral infarction without residual deficits: Secondary | ICD-10-CM | POA: Diagnosis not present

## 2018-12-15 DIAGNOSIS — I2510 Atherosclerotic heart disease of native coronary artery without angina pectoris: Secondary | ICD-10-CM | POA: Insufficient documentation

## 2018-12-15 DIAGNOSIS — G4733 Obstructive sleep apnea (adult) (pediatric): Secondary | ICD-10-CM | POA: Insufficient documentation

## 2018-12-15 DIAGNOSIS — Z86718 Personal history of other venous thrombosis and embolism: Secondary | ICD-10-CM | POA: Insufficient documentation

## 2018-12-15 DIAGNOSIS — L89319 Pressure ulcer of right buttock, unspecified stage: Secondary | ICD-10-CM | POA: Diagnosis not present

## 2018-12-15 DIAGNOSIS — I110 Hypertensive heart disease with heart failure: Secondary | ICD-10-CM | POA: Insufficient documentation

## 2018-12-15 DIAGNOSIS — E1151 Type 2 diabetes mellitus with diabetic peripheral angiopathy without gangrene: Secondary | ICD-10-CM | POA: Diagnosis not present

## 2018-12-15 DIAGNOSIS — L89153 Pressure ulcer of sacral region, stage 3: Secondary | ICD-10-CM | POA: Diagnosis not present

## 2018-12-15 DIAGNOSIS — Z85038 Personal history of other malignant neoplasm of large intestine: Secondary | ICD-10-CM | POA: Diagnosis not present

## 2018-12-15 DIAGNOSIS — I252 Old myocardial infarction: Secondary | ICD-10-CM | POA: Diagnosis not present

## 2018-12-15 DIAGNOSIS — E11622 Type 2 diabetes mellitus with other skin ulcer: Secondary | ICD-10-CM | POA: Diagnosis not present

## 2018-12-15 DIAGNOSIS — J449 Chronic obstructive pulmonary disease, unspecified: Secondary | ICD-10-CM | POA: Insufficient documentation

## 2018-12-15 DIAGNOSIS — Z794 Long term (current) use of insulin: Secondary | ICD-10-CM | POA: Diagnosis not present

## 2018-12-15 DIAGNOSIS — F419 Anxiety disorder, unspecified: Secondary | ICD-10-CM | POA: Diagnosis not present

## 2018-12-15 DIAGNOSIS — S21201A Unspecified open wound of right back wall of thorax without penetration into thoracic cavity, initial encounter: Secondary | ICD-10-CM | POA: Diagnosis not present

## 2018-12-15 NOTE — Progress Notes (Signed)
[name], [name] ([id number]) Visit Report for 12/15/2018 HPI Details Patient Name: Date of Service: [name], [name] 12/15/2018 9:45 AM Medical Record [id number] Patient Account Number: [id number] Date of Birth/Sex: Treating RN: 1950-10-08 (68 y.o. Michelle Holmes, Michelle Holmes Primary Care Provider: Earlene Plater Other Clinician: Referring Provider: Treating Provider/Extender:Robson, Alvin Critchley, ANDREW Weeks in Treatment: 23 History of Present Illness Location: sacrum and left posterior heel Quality: the patient is unable to qualify or describe pain Duration: sacral pressure ulcer has been there for approximately 4 months; the left posterior heel ulcer was discovered today Context: both ulcers are result of recurring pressure Modifying Factors: recurring pressure is in exacerbating condition for both areas HPI Description: 03/05/16- Michelle Holmes arrives today, accompanied by her daughter, for evaluation of her sacral pressure ulcer. Her daughter states that she has had this pressure ulcer for approximately 4 months, receiving treatment from [facility]. They recently changed primary care physicians to Dr. Benjamine Mola. The initial appointment was on 02/17/2016 at which time a recommendation was made for a gel mattress overlay for her hospital bed. According to the daughter this has not been put into place. The sacral pressure ulcer has been treated with saline wet to dry dressings daily per the directions of home health. She has been on no antibiotic therapy for this ulcer. The daughter states that the patient is difficult and uncooperative with repositioning, but has been getting a little bit better. The daughter also states that her diet and appetite have increased over the past month or so. According to lab work obtained in January 2018: albumin of 3 and the A1c of 5.8. She is has a history of diabetes, but has not been treated with oral agents or insulin for several months. After a more  thorough assessment she was noted to have a pressure injury to her left posterior heel, the daughter was aware of this and states that her sister had purchased what appears to be heel protectors/cups, otherwise there has been no treatment or intervention to this ulcer. 03/26/16; this is a patient I haven't seen in 3 weeks. 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 04/09/16; this patient has a stage III pressure ulcer over her left sacrum or heels of apparently healed over. She has her level to surface for her bed. We have been using Prisma. 04/16/16; her heel wounds are apparently closed over, I did not look at these today although I like to do this next week. The area on her sacrum has closed and nicely in the superior tunnel is down to slightly over 1 cm from 2 she has been using Prisma. Claims to be religiously offloading this 04/30/16- patient is here for follow-up of evaluation of her sacral pressure ulcer and left posterior heel. The left posterior heel is closed, remains healed. She was fit into today's schedule secondary to an appointment she had with urology earlier this morning. She is voicing no concerns or complaints 05/14/16; sacral pressure area has come in nicely. Only small open area remains. Using [place] and she has no voice concerns. According to her daughter her left posterior heel ulcer remains closed 05/28/16- She arrives for follow up evaluation of her sacral ulcer. She is accompanied by her daughter, neither are voicing any complaints or concerns READMISSION 07/07/2018 Patient we had 2 years ago with a pressure ulcer  on her sacrum. She has chronic right hemiparesis as a result of a remote CVA. She is accompanied by her daughter who tells me that she developed a pressure ulcer over the right ischium about a month  ago. This started as a superficial open wound however on arrival in clinic this actually tunnels superiorly. They have home health and are using Santyl moistened gauze packing. They are here for our review of this suggested by the home health agency. Past medical history includes type 2 diabetes diet controlled, COPD, CHF, obstructive sleep apnea, coronary artery disease, remote CVA and a history of colon cancer. 7/6-Patient returns with follow-up for the sacral ulcer which is noted to have some drainage that is malodorous, this has not changed since the last visit, she is continued with silver alginate every other day dressing changes, she is accompanied by her daughter, home health is continuing the visits. 7/20; x-ray that was done on 7/6 did not show radiographic evidence of osteomyelitis. I note that a culture I did when I first saw this patient on 6/11 showed a few Proteus. I believe she had a short course of antibiotics. We have been using silver alginate packing to the wound. Her family was here so she eats and drinks well. 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 Notable that the CT scan showed evidence of osteomyelitis of the right ischio. I think they completed the [place]. For some reason they do not have an appointment with infectious disease The concerning area on the right heel closed over 9/24; 1 month follow-up the patient went to see infectious disease and it was recommended that she do a trial of ceftriaxone 2 mg IV plus daptomycin. The appointment was on 09/22/2018 however so far they have heard nothing about this. Our staff is called infectious disease to investigate. She has a new area on the right upper back which looks like an excoriation probably from transferring. 10/22; 1 month follow-up. She last saw infectious  disease on 10/1. She is now on week 3 of ceftaroline. The daptomycin was discontinued. Dr. Graylon Good was the doctor she saw. She is having weekly BMPs CBC sed rates CRP and CKs. I have not had a chance at the time of this dictation to look up the values. This is still a small probing wound today with 2'.7 cm of tunneling depth. There is no palpable bone over this is certainly not any better. There is tenderness around the wound area palpating over the ischial tuberosity itself 11/19; she has completed her antibiotic. I have not reviewed the infectious disease notes nor the follow-up inflammatory markers. The probing depth over the ischial tuberosity is somewhat better at 2 cm. There is no palpable bone no surrounding tenderness. They asked to come back here monthly. We have been using iodoform packing Electronic Signature(s) Signed: 12/15/2018 6:25:25 PM By: Linton Ham MD Entered By: Linton Ham on 12/15/2018 18:06:39 -------------------------------------------------------------------------------- Physical Exam Details Patient Name: Date of Service: DONEEN, OLLINGER 12/15/2018 9:45 AM Medical Record [id number] Patient Account Number: [id number] Date of Birth/Sex: Treating RN: Jun 17, 1950 (69 y.o. Michelle Holmes Primary Care Provider: Earlene Plater Other Clinician: Referring Provider: Treating Provider/Extender:Robson, Alvin Critchley, ANDREW Weeks in Treatment: 23 Constitutional Patient is hypertensive.. Pulse regular and within target range for patient.Marland Kitchen Respirations regular, non-labored and within target range.. Temperature is normal and within  the target range for the patient.Marland Kitchen Appears in no distress. Notes Wound exam; right ischial tuberosity. This is a small probing area. It does not go to bone but probes superiorly about 2 cm. There is no purulent drainage. The orifice of this is too small to do an adequate wound inspection but what I can see looks healthy. There  is no tenderness around the wound no drainage Electronic Signature(s) Signed: 12/15/2018 6:25:25 PM By: Linton Ham MD Entered By: Linton Ham on 12/15/2018 18:08:00 -------------------------------------------------------------------------------- Physician Orders Details Patient Name: Date of Service: SHIANE, WENBERG 12/15/2018 9:45 AM Medical Record [id number] Patient Account Number: [id number] Date of Birth/Sex: Treating RN: 04-06-1950 (68 y.o. Michelle Holmes Primary Care Provider: Earlene Plater Other Clinician: Referring Provider: Treating Provider/Extender:Robson, Alvin Critchley, Jasmine Pang in Treatment: 45 Verbal / Phone Orders: No Diagnosis Coding ICD-10 Coding Code Description L89.313 Pressure ulcer of right buttock, stage 3 M86.68 Other chronic osteomyelitis, other site S20.411D Abrasion of right back wall of thorax, subsequent encounter Follow-up Appointments Return appointment in 1 month. - **hoyer and room 5** Dressing Change Frequency Change Dressing every other day. - home health Change dressing three times week. family member to change all other days. Skin Barriers/Peri-Wound Care Wound #3 Right Ischium Skin Prep Wound Cleansing Clean wound with Wound Cleanser - or normal saline - all wounds Primary Wound Dressing Wound #3 Right Ischium Other: - iodoform packing. Secondary Dressing Wound #3 Right Ischium Foam Border - or ABD pad and tape Off-Loading Air fluidized (Group 3) mattress Turn and reposition every 2 hours [facility] skilled nursing for wound care. Jackquline [place] Electronic Signature(s) Signed: 12/15/2018 6:25:25 PM By: Linton Ham MD Signed: 12/15/2018 6:38:14 PM By: Deon Pilling Entered By: Deon Pilling on 12/15/2018 11:37:50 -------------------------------------------------------------------------------- Problem List Details Patient Name: Date of Service: YVONE, SLAPE 12/15/2018 9:45  AM Medical Record [id number] Patient Account Number: [id number] Date of Birth/Sex: Treating RN: 1951-01-02 (68 y.o. Michelle Holmes Primary Care Provider: Earlene Plater Other Clinician: Referring Provider: Treating Provider/Extender:Robson, Alvin Critchley, Jasmine Pang in Treatment: 23 Active Problems ICD-10 Evaluated Encounter Code Description Active Date Today Diagnosis L89.313 Pressure ulcer of right buttock, stage 3 07/07/2018 No Yes M86.68 Other chronic osteomyelitis, other site 09/19/2018 No Yes S20.411D Abrasion of right back wall of thorax, subsequent 10/20/2018 No Yes encounter Inactive Problems Resolved Problems Electronic Signature(s) Signed: 12/15/2018 6:25:25 PM By: Linton Ham MD Entered By: Linton Ham on 12/15/2018 18:05:20 -------------------------------------------------------------------------------- Progress Note Details Patient Name: Date of Service: [name] 12/15/2018 9:45 AM Medical Record [id number] Patient Account Number: [id number] Date of Birth/Sex: Treating RN: 1950-04-20 (69 y.o. Michelle Holmes, Michelle Holmes Primary Care Provider: Earlene Plater Other Clinician: Referring Provider: Treating Provider/Extender:Robson, Alvin Critchley, ANDREW Weeks in Treatment: 23 Subjective History of Present Illness (HPI) The following HPI elements were documented for the patient's wound: Location: sacrum and left posterior heel Quality: the patient is unable to qualify or describe pain Duration: sacral pressure ulcer has been there for approximately 4 months; the left posterior heel ulcer was discovered today Context: both ulcers are result of recurring pressure Modifying Factors: recurring pressure is in exacerbating condition for both areas 03/05/16- Michelle Holmes arrives today, accompanied by her daughter, for evaluation of her sacral pressure ulcer. Her daughter states that she has had this pressure ulcer for approximately 4 months,  receiving treatment from [facility] home [facility]. They recently changed primary care physicians to Dr. Benjamine Mola. The initial appointment was on 02/17/2016 at which time a recommendation was made  for a gel mattress overlay for her hospital bed. According to the daughter this has not been put into place. The sacral pressure ulcer has been treated with saline wet to dry dressings daily per the directions of home health. She has been on no antibiotic therapy for this ulcer. The daughter states that the patient is difficult and uncooperative with repositioning, but has been getting a little bit better. The daughter also states that her diet and appetite have increased over the past month or so. According to lab work obtained in January 2018: albumin of 3 and the A1c of 5.8. She is has a history of diabetes, but has not been treated with oral agents or insulin for several months. After a more thorough assessment she was noted to have a pressure injury to her left posterior heel, the daughter was aware of this and states that her sister had purchased what appears to be heel protectors/cups, otherwise there has been no treatment or intervention to this ulcer. 03/26/16; this is a patient I haven't seen in 3 weeks. 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 04/09/16; this patient has a stage III pressure ulcer over her left sacrum or heels of apparently healed over. She has her level to surface for her bed. We have been using Prisma. 04/16/16; her heel wounds are apparently closed over, I did not look at these today although I like to do this next week. The area on her sacrum has closed and nicely in the superior tunnel is down to slightly over 1 cm from 2 she has been using Prisma. Claims to be religiously offloading this 04/30/16- patient is here for follow-up  of evaluation of her sacral pressure ulcer and left posterior heel. The left posterior heel is closed, remains healed. She was fit into today's schedule secondary to an appointment she had with urology earlier this morning. She is voicing no concerns or complaints 05/14/16; sacral pressure area has come in nicely. Only small open area remains. Using [place] and she has no voice concerns. According to her daughter her left posterior heel ulcer remains closed 05/28/16- She arrives for follow up evaluation of her sacral ulcer. She is accompanied by her daughter, neither are voicing any complaints or concerns READMISSION 07/07/2018 Patient we had 2 years ago with a pressure ulcer on her sacrum. She has chronic right hemiparesis as a result of a remote CVA. She is accompanied by her daughter who tells me that she developed a pressure ulcer over the right ischium about a month ago. This started as a superficial open wound however on arrival in clinic this actually tunnels superiorly. They have home health and are using Santyl moistened gauze packing. They are here for our review of this suggested by the home health agency. Past medical history includes type 2 diabetes diet controlled, COPD, CHF, obstructive sleep apnea, coronary artery disease, remote CVA and a history of colon cancer. 7/6-Patient returns with follow-up for the sacral ulcer which is noted to have some drainage that is malodorous, this has not changed since the last visit, she is continued with silver alginate every other day dressing changes, she is accompanied by her daughter, home health is continuing the visits. 7/20; x-ray that was done on 7/6 did not show radiographic evidence of osteomyelitis. I note that a culture I did when  I first saw this patient on 6/11 showed a few Proteus. I believe she had a short course of antibiotics. We have been using silver alginate packing to the wound. Her family was here so she eats and drinks  well. 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 Notable that the CT scan showed evidence of osteomyelitis of the right ischio. I think they completed the [place]. For some reason they do not have an appointment with infectious disease The concerning area on the right heel closed over 9/24; 1 month follow-up the patient went to see infectious disease and it was recommended that she do a trial of ceftriaxone 2 mg IV plus daptomycin. The appointment was on 09/22/2018 however so far they have heard nothing about this. Our staff is called infectious disease to investigate. She has a new area on the right upper back which looks like an excoriation probably from transferring. 10/22; 1 month follow-up. She last saw infectious disease on 10/1. She is now on week 3 of ceftaroline. The daptomycin was discontinued. Dr. Graylon Good was the doctor she saw. She is having weekly BMPs CBC sed rates CRP and CKs. I have not had a chance at the time of this dictation to look up the values. This is still a small probing wound today with 2'.7 cm of tunneling depth. There is no palpable bone over this is certainly not any better. There is tenderness around the wound area palpating over the ischial tuberosity itself 11/19; she has completed her antibiotic. I have not reviewed the infectious disease notes nor the follow-up inflammatory markers. The probing depth over the ischial tuberosity is somewhat better at 2 cm. There is no palpable bone no surrounding tenderness. They asked to come back here monthly. We have been using iodoform packing Objective Constitutional Patient is hypertensive.. Pulse regular and within target range for patient.Marland Kitchen Respirations regular, non-labored and within target range.. Temperature is normal and within the target range for the patient.Marland Kitchen  Appears in no distress. Vitals Time Taken: 11:20 AM, Height: 69 in, Weight: 142 lbs, BMI: 21, Temperature: 98.2 F, Pulse: 84 bpm, Respiratory Rate: 16 breaths/min, Blood Pressure: 164/75 mmHg. General Notes: Wound exam; right ischial tuberosity. This is a small probing area. It does not go to bone but probes superiorly about 2 cm. There is no purulent drainage. The orifice of this is too small to do an adequate wound inspection but what I can see looks healthy. There is no tenderness around the wound no drainage Integumentary (Hair, Skin) Wound #3 status is Open. Original cause of wound was Pressure Injury. The wound is located on the Right Ischium. The wound measures 0.2cm length x 0.2cm width x 2cm depth; 0.031cm^2 area and 0.063cm^3 volume. There is Fat Layer (Subcutaneous Tissue) Exposed exposed. There is no tunneling or undermining noted. There is a medium amount of serosanguineous drainage noted. The wound margin is well defined and not attached to the wound base. There is large (67-100%) pink granulation within the wound bed. There is no necrotic tissue within the wound bed. Wound #6 status is Healed - Epithelialized. Original cause of wound was Gradually Appeared. The wound is located on the Right Back. The wound measures 0cm length x 0cm width x 0cm depth; 0cm^2 area and 0cm^3 volume. There is no tunneling or undermining noted. There is a medium amount of  serosanguineous drainage noted. The wound margin is flat and intact. There is no granulation within the wound bed. There is no necrotic tissue within the wound bed. Assessment Active Problems ICD-10 Pressure ulcer of right buttock, stage 3 Other chronic osteomyelitis, other site Abrasion of right back wall of thorax, subsequent encounter Plan Follow-up Appointments: Return appointment in 1 month. - **hoyer and room 5** Dressing Change Frequency: Change Dressing every other day. - home health Change dressing three times week.  family member to change all other days. Skin Barriers/Peri-Wound Care: Wound #3 Right Ischium: Skin Prep Wound Cleansing: Clean wound with Wound Cleanser - or normal saline - all wounds Primary Wound Dressing: Wound #3 Right Ischium: Other: - iodoform packing. Secondary Dressing: Wound #3 Right Ischium: Foam Border - or ABD pad and tape Off-Loading: Air fluidized (Group 3) mattress Turn and reposition every 2 hours Home Health: [facility] for wound care. - [facility]. Right ischium. Continue with iodoform. The wound appears stable. She has completed her antibiotic therapy for her underlying osteomyelitis with the assistance of infectious disease. At the time of this dictation I have not reviewed their notes Electronic Signature(s) Signed: 12/15/2018 6:25:25 PM By: Linton Ham MD Entered By: Linton Ham on 12/15/2018 18:08:38 -------------------------------------------------------------------------------- SuperBill Details Patient Name: Date of Service: [name] 12/15/2018 Medical Record [id number] Patient Account Number: [id number] Date of Birth/Sex: Treating RN: April 15, 1950 (68 y.o. Michelle Holmes Primary Care Provider: Earlene Plater Other Clinician: Referring Provider: Treating Provider/Extender:Robson, Alvin Critchley, ANDREW Weeks in Treatment: 23 Diagnosis Coding ICD-10 Codes Code Description L89.313 Pressure ulcer of right buttock, stage 3 M86.68 Other chronic osteomyelitis, other site S20.411D Abrasion of right back wall of thorax, subsequent encounter Facility Procedures The patient participates with Medicare or their insurance follows the Medicare Facility Guidelines: CPT4 Code Description Modifier Quantity [id number] [place] VISIT-LEV 3 EST PT 1 Physician Procedures CPT4 Code: 8329191 Description: 66060 - WC PHYS LEVEL 2 - EST PT ICD-10 Diagnosis Description L89.313 Pressure ulcer of right buttock,  stage 3 Modifier: Quantity: 1 Electronic Signature(s) Signed: 12/15/2018 6:25:25 PM By: Linton Ham MD Entered By: Linton Ham on 12/15/2018 18:08:51

## 2018-12-16 ENCOUNTER — Telehealth: Payer: Self-pay

## 2018-12-16 ENCOUNTER — Other Ambulatory Visit: Payer: Self-pay

## 2018-12-16 ENCOUNTER — Ambulatory Visit (INDEPENDENT_AMBULATORY_CARE_PROVIDER_SITE_OTHER): Payer: Medicare Other | Admitting: Internal Medicine

## 2018-12-16 VITALS — BP 146/82 | HR 97

## 2018-12-16 DIAGNOSIS — I120 Hypertensive chronic kidney disease with stage 5 chronic kidney disease or end stage renal disease: Secondary | ICD-10-CM

## 2018-12-16 DIAGNOSIS — M4628 Osteomyelitis of vertebra, sacral and sacrococcygeal region: Secondary | ICD-10-CM

## 2018-12-16 DIAGNOSIS — Z466 Encounter for fitting and adjustment of urinary device: Secondary | ICD-10-CM | POA: Diagnosis not present

## 2018-12-16 DIAGNOSIS — R110 Nausea: Secondary | ICD-10-CM | POA: Diagnosis not present

## 2018-12-16 DIAGNOSIS — N184 Chronic kidney disease, stage 4 (severe): Secondary | ICD-10-CM

## 2018-12-16 DIAGNOSIS — J439 Emphysema, unspecified: Secondary | ICD-10-CM | POA: Diagnosis not present

## 2018-12-16 DIAGNOSIS — I2510 Atherosclerotic heart disease of native coronary artery without angina pectoris: Secondary | ICD-10-CM | POA: Diagnosis not present

## 2018-12-16 DIAGNOSIS — L89313 Pressure ulcer of right buttock, stage 3: Secondary | ICD-10-CM | POA: Diagnosis not present

## 2018-12-16 DIAGNOSIS — D539 Nutritional anemia, unspecified: Secondary | ICD-10-CM | POA: Diagnosis not present

## 2018-12-16 DIAGNOSIS — R1310 Dysphagia, unspecified: Secondary | ICD-10-CM | POA: Diagnosis not present

## 2018-12-16 DIAGNOSIS — N185 Chronic kidney disease, stage 5: Secondary | ICD-10-CM

## 2018-12-16 DIAGNOSIS — E1122 Type 2 diabetes mellitus with diabetic chronic kidney disease: Secondary | ICD-10-CM | POA: Diagnosis not present

## 2018-12-16 DIAGNOSIS — M1990 Unspecified osteoarthritis, unspecified site: Secondary | ICD-10-CM | POA: Diagnosis not present

## 2018-12-16 DIAGNOSIS — D631 Anemia in chronic kidney disease: Secondary | ICD-10-CM | POA: Diagnosis not present

## 2018-12-16 DIAGNOSIS — E7800 Pure hypercholesterolemia, unspecified: Secondary | ICD-10-CM | POA: Diagnosis not present

## 2018-12-16 DIAGNOSIS — Z7401 Bed confinement status: Secondary | ICD-10-CM | POA: Diagnosis not present

## 2018-12-16 DIAGNOSIS — I69331 Monoplegia of upper limb following cerebral infarction affecting right dominant side: Secondary | ICD-10-CM

## 2018-12-16 DIAGNOSIS — I252 Old myocardial infarction: Secondary | ICD-10-CM | POA: Diagnosis not present

## 2018-12-16 DIAGNOSIS — E43 Unspecified severe protein-calorie malnutrition: Secondary | ICD-10-CM | POA: Diagnosis not present

## 2018-12-16 DIAGNOSIS — I69391 Dysphagia following cerebral infarction: Secondary | ICD-10-CM | POA: Diagnosis not present

## 2018-12-16 DIAGNOSIS — I69320 Aphasia following cerebral infarction: Secondary | ICD-10-CM | POA: Diagnosis not present

## 2018-12-16 DIAGNOSIS — I69351 Hemiplegia and hemiparesis following cerebral infarction affecting right dominant side: Secondary | ICD-10-CM | POA: Diagnosis not present

## 2018-12-16 DIAGNOSIS — D649 Anemia, unspecified: Secondary | ICD-10-CM | POA: Diagnosis not present

## 2018-12-16 DIAGNOSIS — G8929 Other chronic pain: Secondary | ICD-10-CM | POA: Diagnosis not present

## 2018-12-16 DIAGNOSIS — Z87440 Personal history of urinary (tract) infections: Secondary | ICD-10-CM | POA: Diagnosis not present

## 2018-12-16 DIAGNOSIS — G4733 Obstructive sleep apnea (adult) (pediatric): Secondary | ICD-10-CM | POA: Diagnosis not present

## 2018-12-16 DIAGNOSIS — Z79899 Other long term (current) drug therapy: Secondary | ICD-10-CM

## 2018-12-16 DIAGNOSIS — M549 Dorsalgia, unspecified: Secondary | ICD-10-CM | POA: Diagnosis not present

## 2018-12-16 DIAGNOSIS — I69344 Monoplegia of lower limb following cerebral infarction affecting left non-dominant side: Secondary | ICD-10-CM

## 2018-12-16 DIAGNOSIS — I130 Hypertensive heart and chronic kidney disease with heart failure and stage 1 through stage 4 chronic kidney disease, or unspecified chronic kidney disease: Secondary | ICD-10-CM | POA: Diagnosis not present

## 2018-12-16 DIAGNOSIS — I5032 Chronic diastolic (congestive) heart failure: Secondary | ICD-10-CM | POA: Diagnosis not present

## 2018-12-16 DIAGNOSIS — I639 Cerebral infarction, unspecified: Secondary | ICD-10-CM | POA: Diagnosis not present

## 2018-12-16 DIAGNOSIS — I10 Essential (primary) hypertension: Secondary | ICD-10-CM

## 2018-12-16 DIAGNOSIS — M419 Scoliosis, unspecified: Secondary | ICD-10-CM | POA: Diagnosis not present

## 2018-12-16 DIAGNOSIS — N139 Obstructive and reflux uropathy, unspecified: Secondary | ICD-10-CM | POA: Diagnosis not present

## 2018-12-16 MED ORDER — AMLODIPINE BESYLATE 10 MG PO TABS: 10.0000 mg | ORAL_TABLET | Freq: Every day | ORAL | 3 refills | Status: DC

## 2018-12-16 NOTE — Progress Notes (Signed)
   CC: Followup for bloodwork  HPI:  Patient is a 68 year old female with past medical history as below who presents for routine follow-up and blood work. [name] is a 68 y.o.   Past Medical History:  Diagnosis Date  . Anemia   . CHF (congestive heart failure) ([place])   . Chronic back pain   . COPD (chronic obstructive pulmonary disease) ([place])   . Coronary artery disease   . Diabetes mellitus   . DJD (degenerative joint disease)   . Emphysema   . Hypercholesteremia   . Hypertension   . Myocardial infarction ([place])   . Obesity   . Obesity hypoventilation syndrome ([place])   . Renal insufficiency   . Scoliosis   . Sleep apnea   . Stroke ([place])   . TIA (transient ischemic attack)    Review of Systems:   Review of Systems  Constitutional: Negative for chills and fever.  HENT: Negative for congestion.   Respiratory: Negative for cough and shortness of breath.   Cardiovascular: Negative for chest pain.  Gastrointestinal: Positive for nausea. Negative for abdominal pain, constipation, diarrhea and vomiting.  All other systems reviewed and are negative.    Physical Exam:  Physical Exam  Constitutional: She is well-developed, well-nourished, and in no distress.  HENT:  Head: Normocephalic and atraumatic.  Eyes: EOM are normal. Right eye exhibits no discharge. Left eye exhibits no discharge.  Neck: Normal range of motion. No tracheal deviation present.  Cardiovascular: Normal rate and regular rhythm. Exam reveals no gallop and no friction rub.  No murmur heard. Pulmonary/Chest: Effort normal and breath sounds normal. No respiratory distress. She has no wheezes. She has no rales.  Abdominal: Soft. She exhibits no distension. There is no abdominal tenderness. There is no rebound and no guarding.  Musculoskeletal: Normal range of motion.        General: No tenderness, deformity or edema.  Neurological: She is alert.  4/5 strength in left upper extremity. Unable to move right  upper or left lower extremity. Deficits are chronic from prior stroke. Sensation in tact throughout.  Skin: Skin is warm and dry. No rash noted. She is not diaphoretic. No erythema.  Psychiatric: Memory and judgment normal.     Assessment & Plan:   See Encounters Tab for problem based charting.  Patient seen and discussed with Dr. Rebeca Alert

## 2018-12-16 NOTE — Assessment & Plan Note (Signed)
Lab Results  Component Value Date   WBC 10.6 (H) 08/16/2017   HGB 10.1 (L) 08/16/2017   HCT 29.5 (L) 08/16/2017   MCV 102.8 (H) 08/16/2017   PLT 228 08/16/2017   Lab Results  Component Value Date   IRON 31 08/08/2016   TIBC 172 (L) 08/08/2016   FERRITIN 151 08/08/2016     Patient with chronic anemia. Thought to be secondary to anemia of chronic disease. Will recheck CBC and order B12/folate levels given elevated MCV on last CBC

## 2018-12-16 NOTE — Telephone Encounter (Signed)
Michelle Holmes with [facility] is calling.  Pt has a tunneled PICC and IV antibiotics have ended.  Checking on pull orders and if so please schedule patient and inform [place].     Order placed for tunneled PICC removal.  Call placed to interventional to schedule PICC removal. Message left on voice mail.    Dr Baxter Flattery, does the patient have scripts for the oral antibiotics you spoke of at 10-20-2018 office visit ?    Laverle Patter, RN

## 2018-12-16 NOTE — Assessment & Plan Note (Signed)
BP Readings from Last 3 Encounters:  12/16/18 (!) 146/82  10/27/18 (!) 156/90  02/28/18 119/66   Blood pressure elevated to 146/82. Asymptomatic. eGFR of 20 in July 2019. On Amlodipine 5 mg QD. Will increase amlodipine to 10 mg and recheck BMP to assess renal function which would inform decisions regarding thiazides or ACE/ARB therapy. * Amlodipine 10 mg daily

## 2018-12-16 NOTE — Assessment & Plan Note (Signed)
History of CVA approximately 5 years ago. Last lipid profile in 2017. Daughter denies patient has been on statin. Will recheck lipid panel to assess need for statin for risk-factor optimization.

## 2018-12-16 NOTE — Patient Instructions (Signed)
You were seen in clinic today for follow-up on your blood pressure and for lab work.  Since your blood pressure is still elevated, we want you to take amlodipine 10 mg daily (increased from amlodipine 5 mg daily).  We are going to check your hemoglobin levels since they have been low in the past. We are also checking your kidney function and your cholesterol levels.  We will call you if there are any significant abnormalities.  Thank you for allowing [place] to be part of your medical care!

## 2018-12-17 LAB — BMP8+ANION GAP
Anion Gap: 19 mmol/L — ABNORMAL HIGH (ref 10.0–18.0)
BUN/Creatinine Ratio: 16 (ref 12–28)
BUN: 65 mg/dL — ABNORMAL HIGH (ref 8–27)
CO2: 17 mmol/L — ABNORMAL LOW (ref 20–29)
Calcium: 9.7 mg/dL (ref 8.7–10.3)
Chloride: 106 mmol/L (ref 96–106)
Creatinine, Ser: 4.13 mg/dL — ABNORMAL HIGH (ref 0.57–1.00)
GFR calc Af Amer: 12 mL/min/{1.73_m2} — ABNORMAL LOW (ref >59)
GFR calc non Af Amer: 11 mL/min/{1.73_m2} — ABNORMAL LOW (ref >59)
Glucose: 126 mg/dL — ABNORMAL HIGH (ref 65–99)
Potassium: 5.2 mmol/L (ref 3.5–5.2)
Sodium: 142 mmol/L (ref 134–144)

## 2018-12-17 LAB — CBC
Hematocrit: 27.3 % — ABNORMAL LOW (ref 34.0–46.6)
Hemoglobin: 9.1 g/dL — ABNORMAL LOW (ref 11.1–15.9)
MCH: 30.2 pg (ref 26.6–33.0)
MCHC: 33.3 g/dL (ref 31.5–35.7)
MCV: 91 fL (ref 79–97)
Platelets: 287 10*3/uL (ref 150–450)
RBC: 3.01 x10E6/uL — ABNORMAL LOW (ref 3.77–5.28)
RDW: 13.2 % (ref 11.7–15.4)
WBC: 9.2 10*3/uL (ref 3.4–10.8)

## 2018-12-17 LAB — LIPID PANEL
Chol/HDL Ratio: 4.7 ratio — ABNORMAL HIGH (ref 0.0–4.4)
Cholesterol, Total: 161 mg/dL (ref 100–199)
HDL: 34 mg/dL — ABNORMAL LOW (ref >39)
LDL Chol Calc (NIH): 99 mg/dL (ref 0–99)
Triglycerides: 160 mg/dL — ABNORMAL HIGH (ref 0–149)
VLDL Cholesterol Cal: 28 mg/dL (ref 5–40)

## 2018-12-17 LAB — B12 AND FOLATE PANEL
Folate: 15.2 ng/mL (ref >3.0)
Vitamin B-12: 493 pg/mL (ref 232–1245)

## 2018-12-19 ENCOUNTER — Other Ambulatory Visit: Payer: Self-pay | Admitting: Internal Medicine

## 2018-12-19 DIAGNOSIS — J439 Emphysema, unspecified: Secondary | ICD-10-CM | POA: Diagnosis not present

## 2018-12-19 DIAGNOSIS — G8929 Other chronic pain: Secondary | ICD-10-CM | POA: Diagnosis not present

## 2018-12-19 DIAGNOSIS — Z466 Encounter for fitting and adjustment of urinary device: Secondary | ICD-10-CM | POA: Diagnosis not present

## 2018-12-19 DIAGNOSIS — I130 Hypertensive heart and chronic kidney disease with heart failure and stage 1 through stage 4 chronic kidney disease, or unspecified chronic kidney disease: Secondary | ICD-10-CM | POA: Diagnosis not present

## 2018-12-19 DIAGNOSIS — E1122 Type 2 diabetes mellitus with diabetic chronic kidney disease: Secondary | ICD-10-CM | POA: Diagnosis not present

## 2018-12-19 DIAGNOSIS — E43 Unspecified severe protein-calorie malnutrition: Secondary | ICD-10-CM | POA: Diagnosis not present

## 2018-12-19 DIAGNOSIS — I5032 Chronic diastolic (congestive) heart failure: Secondary | ICD-10-CM | POA: Diagnosis not present

## 2018-12-19 DIAGNOSIS — Z87440 Personal history of urinary (tract) infections: Secondary | ICD-10-CM | POA: Diagnosis not present

## 2018-12-19 DIAGNOSIS — G4733 Obstructive sleep apnea (adult) (pediatric): Secondary | ICD-10-CM | POA: Diagnosis not present

## 2018-12-19 DIAGNOSIS — E7800 Pure hypercholesterolemia, unspecified: Secondary | ICD-10-CM | POA: Diagnosis not present

## 2018-12-19 DIAGNOSIS — N184 Chronic kidney disease, stage 4 (severe): Secondary | ICD-10-CM | POA: Diagnosis not present

## 2018-12-19 DIAGNOSIS — M549 Dorsalgia, unspecified: Secondary | ICD-10-CM | POA: Diagnosis not present

## 2018-12-19 DIAGNOSIS — Z7401 Bed confinement status: Secondary | ICD-10-CM | POA: Diagnosis not present

## 2018-12-19 DIAGNOSIS — I69320 Aphasia following cerebral infarction: Secondary | ICD-10-CM | POA: Diagnosis not present

## 2018-12-19 DIAGNOSIS — L89313 Pressure ulcer of right buttock, stage 3: Secondary | ICD-10-CM | POA: Diagnosis not present

## 2018-12-19 DIAGNOSIS — D631 Anemia in chronic kidney disease: Secondary | ICD-10-CM | POA: Diagnosis not present

## 2018-12-19 DIAGNOSIS — I252 Old myocardial infarction: Secondary | ICD-10-CM | POA: Diagnosis not present

## 2018-12-19 DIAGNOSIS — M1990 Unspecified osteoarthritis, unspecified site: Secondary | ICD-10-CM | POA: Diagnosis not present

## 2018-12-19 DIAGNOSIS — I69351 Hemiplegia and hemiparesis following cerebral infarction affecting right dominant side: Secondary | ICD-10-CM | POA: Diagnosis not present

## 2018-12-19 DIAGNOSIS — N139 Obstructive and reflux uropathy, unspecified: Secondary | ICD-10-CM | POA: Diagnosis not present

## 2018-12-19 DIAGNOSIS — I69391 Dysphagia following cerebral infarction: Secondary | ICD-10-CM | POA: Diagnosis not present

## 2018-12-19 DIAGNOSIS — I2510 Atherosclerotic heart disease of native coronary artery without angina pectoris: Secondary | ICD-10-CM | POA: Diagnosis not present

## 2018-12-19 DIAGNOSIS — M419 Scoliosis, unspecified: Secondary | ICD-10-CM | POA: Diagnosis not present

## 2018-12-19 DIAGNOSIS — R1310 Dysphagia, unspecified: Secondary | ICD-10-CM | POA: Diagnosis not present

## 2018-12-19 MED ORDER — ATORVASTATIN CALCIUM 20 MG PO TABS: 20.0000 mg | ORAL_TABLET | Freq: Every day | ORAL | 3 refills | Status: DC

## 2018-12-19 NOTE — Progress Notes (Addendum)
Lab studies reviewed. Patient with ASCVD of 12% and would benefit from statin therapy. Will start Atorvastatin 20 mg QD. Renal function has worsened with creatinine of 4.13 -> 2.90 on 09/01/18. Will refer to nephrology for further evaluation/management recommendations.

## 2018-12-19 NOTE — Progress Notes (Signed)
Internal Medicine Clinic Attending  I saw and evaluated the patient.  I personally confirmed the key portions of the history and exam documented by Dr. Benjamine Mola and I reviewed pertinent patient test results.  The assessment, diagnosis, and plan were formulated together and I agree with the documentation in the resident's note.  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   Lenice Pressman, M.D., Ph.D.

## 2018-12-20 NOTE — Progress Notes (Signed)
MARKELL, SCHRIER ([id number]) Visit Report for 12/15/2018 Arrival Information Details Patient Name: Date of Service: Michelle Holmes, Michelle Holmes 12/15/2018 9:45 AM Medical Record [id number] Patient Account Number: [id number] Date of Birth/Sex: Treating RN: April 15, 1950 (68 y.o. Nancy Fetter Primary Care Kanyon Bunn: Earlene Plater Other Clinician: Referring Maxxwell Edgett: Treating Lulie Hurd/Extender:Robson, Alvin Critchley, Jasmine Pang in Treatment: 23 Visit Information History Since Last Visit Added or deleted any medications: No Patient Arrived: Wheel Chair Any new allergies or adverse reactions: No Arrival Time: 11:17 Had a fall or experienced change in No activities of daily living that may affect Accompanied By: daughter risk of falls: Transfer Assistance: None Signs or symptoms of abuse/neglect since last No Patient Identification Verified: Yes visito Secondary Verification Process Completed: Yes Hospitalized since last visit: No Patient Requires Transmission-Based No Implantable device outside of the clinic excluding No Precautions: cellular tissue based products placed in the center Patient Has Alerts: No since last visit: Has Dressing in Place as Prescribed: Yes Pain Present Now: No Electronic Signature(s) Signed: 12/19/2018 2:18:43 PM By: Levan Hurst RN, BSN Entered By: Levan Hurst on 12/15/2018 11:17:41 -------------------------------------------------------------------------------- Clinic Level of Care Assessment Details Patient Name: Date of Service: Michelle, Holmes 12/15/2018 9:45 AM Medical Record [id number] Patient Account Number: [id number] Date of Birth/Sex: Treating RN: 28-Jul-1950 (68 y.o. [name], Tammi Klippel Primary Care Lakeeta Dobosz: Earlene Plater Other Clinician: Referring Kristi Norment: Treating Ozro Russett/Extender:Robson, Alvin Critchley, Jasmine Pang in Treatment: 23 Clinic Level of Care Assessment Items TOOL 4 Quantity Score X - Use when only an  EandM is performed on FOLLOW-UP visit 1 0 ASSESSMENTS - Nursing Assessment / Reassessment X - Reassessment of Co-morbidities (includes updates in patient status) 1 10 X - Reassessment of Adherence to Treatment Plan 1 5 ASSESSMENTS - Wound and Skin Assessment / Reassessment X - Simple Wound Assessment / Reassessment - one wound 1 5 []  - Complex Wound Assessment / Reassessment - multiple wounds 0 X - Dermatologic / Skin Assessment (not related to wound area) 1 10 ASSESSMENTS - Focused Assessment []  - Circumferential Edema Measurements - multi extremities 0 X - Nutritional Assessment / Counseling / Intervention 1 10 []  - Lower Extremity Assessment (monofilament, tuning fork, pulses) 0 []  - Peripheral Arterial Disease Assessment (using hand held doppler) 0 ASSESSMENTS - Ostomy and/or Continence Assessment and Care []  - Incontinence Assessment and Management 0 []  - Ostomy Care Assessment and Management (repouching, etc.) 0 PROCESS - Coordination of Care X - Simple Patient / Family Education for ongoing care 1 15 []  - Complex (extensive) Patient / Family Education for ongoing care 0 X - Staff obtains Programmer, systems, Records, Test Results / Process Orders 1 10 X - Staff telephones HHA, Nursing Homes / Clarify orders / etc 1 10 []  - Routine Transfer to another Facility (non-emergent condition) 0 []  - Routine Hospital Admission (non-emergent condition) 0 []  - New Admissions / Biomedical engineer / Ordering NPWT, Apligraf, etc. 0 []  - Emergency Hospital Admission (emergent condition) 0 X - Simple Discharge Coordination 1 10 []  - Complex (extensive) Discharge Coordination 0 PROCESS - Special Needs []  - Pediatric / Minor Patient Management 0 []  - Isolation Patient Management 0 []  - Hearing / Language / Visual special needs 0 []  - Assessment of Community assistance (transportation, D/C planning, etc.) 0 []  - Additional assistance / Altered mentation 0 []  - Support Surface(s) Assessment (bed,  cushion, seat, etc.) 0 INTERVENTIONS - Wound Cleansing / Measurement X - Simple Wound Cleansing - one wound 1 5 []  - Complex Wound Cleansing - multiple wounds 0  X - Wound Imaging (photographs - any number of wounds) 1 5 []  - Wound Tracing (instead of photographs) 0 X - Simple Wound Measurement - one wound 1 5 []  - Complex Wound Measurement - multiple wounds 0 INTERVENTIONS - Wound Dressings X - Small Wound Dressing one or multiple wounds 1 10 []  - Medium Wound Dressing one or multiple wounds 0 []  - Large Wound Dressing one or multiple wounds 0 []  - Application of Medications - topical 0 []  - Application of Medications - injection 0 INTERVENTIONS - Miscellaneous []  - External ear exam 0 []  - Specimen Collection (cultures, biopsies, blood, body fluids, etc.) 0 []  - Specimen(s) / Culture(s) sent or taken to Lab for analysis 0 []  - Patient Transfer (multiple staff / Civil Service fast streamer / Similar devices) 0 []  - Simple Staple / Suture removal (25 or less) 0 []  - Complex Staple / Suture removal (26 or more) 0 []  - Hypo / Hyperglycemic Management (close monitor of Blood Glucose) 0 []  - Ankle / Brachial Index (ABI) - do not check if billed separately 0 X - Vital Signs 1 5 Has the patient been seen at the hospital within the last three years: Yes Total Score: 115 Level Of Care: New/Established - Level 3 Electronic Signature(s) Signed: 12/15/2018 6:38:14 PM By: Deon Pilling Entered By: Deon Pilling on 12/15/2018 11:38:45 -------------------------------------------------------------------------------- Encounter Discharge Information Details Patient Name: Date of Service: Michelle Holmes 12/15/2018 9:45 AM Medical Record [id number] Patient Account Number: [id number] Date of Birth/Sex: Treating RN: 1950/06/16 (68 y.o. Elam Dutch Primary Care Adely Facer: Earlene Plater Other Clinician: Referring Gola Bribiesca: Treating Graceyn Fodor/Extender:Robson, Alvin Critchley, Jasmine Pang in  Treatment: 23 Encounter Discharge Information Items Discharge Condition: Stable Ambulatory Status: Wheelchair Discharge Destination: Home Transportation: Private Auto Accompanied By: Charolett Bumpers Schedule Follow-up Appointment: Yes Clinical Summary of Care: Patient Declined Electronic Signature(s) Signed: 12/15/2018 6:27:04 PM By: Baruch Gouty RN, BSN Entered By: Baruch Gouty on 12/15/2018 12:32:00 -------------------------------------------------------------------------------- Multi Wound Chart Details Patient Name: Date of Service: Michelle Holmes 12/15/2018 9:45 AM Medical Record [id number] Patient Account Number: [id number] Date of Birth/Sex: Treating RN: 1950-02-10 (68 y.o. 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] [6:Abrasion] [N/A:N/A] Comorbid History: [3:Anemia, Chronic Obstructive Pulmonary Disease (COPD), Sleep Apnea, Congestive Heart Failure, Coronary Artery Disease, Deep Vein Thrombosis, Hypertension, Myocardial Infarction, Type II Diabetes, Received Chemotherapy, Received  Radiation, Confinement Anxiety] [6:Anemia, Chronic Obstructive Pulmonary Disease (COPD), Sleep Apnea, Congestive Heart Failure, Coronary Artery Disease, Deep Vein Thrombosis, Hypertension, Myocardial Infarction, Type II Diabetes, Received Chemotherapy,  Received Radiation, Confinement Anxiety] [N/A:N/A] Date Acquired: [3:06/09/2018] [6:10/17/2018] [N/A:N/A] Weeks of Treatment: [3:23]  [6:8] [N/A:N/A] Wound Status: [3:Open] [6:Healed - Epithelialized] [N/A:N/A] Clustered Wound: [3:Yes] [6:Yes] [N/A:N/A] Clustered Quantity: [3:1] [6:N/A] [N/A:N/A] Measurements L x W x D 0.2x0.2x2 [6:0x0x0] [N/A:N/A] (cm) Area (cm) : [3:0.031] [6:0] [N/A:N/A] Volume (cm) : [3:0.063] [6:0] [N/A:N/A] % Reduction in Area: [3:98.50%] [6:100.00%] [N/A:N/A] % Reduction in Volume: 95.70% [6:100.00%] [N/A:N/A] Classification: [3:Category/Stage III] [6:Full Thickness Without Exposed Support Structures] [N/A:N/A] Exudate Amount: [3:Medium] [6:Medium] [N/A:N/A] Exudate Type: [3:Serosanguineous] [6:Serosanguineous] [N/A:N/A] Exudate Color: [3:red, brown] [6:red, brown] [N/A:N/A] Wound Margin: [3:Well defined, not attached Flat and Intact] [N/A:N/A] Granulation Amount: [3:Large (67-100%)] [6:None Present (  0%)] [N/A:N/A] Granulation Quality: [3:Pink] [6:N/A] [N/A:N/A] Necrotic Amount: [3:None Present (0%)] [6:None Present (0%)] [N/A:N/A] Exposed Structures: [3:Fat Layer (Subcutaneous Fascia: No Tissue) Exposed: Yes Fascia: No Tendon: No Muscle: No Joint: No Bone: No Large (67-100%)] [6:Fat Layer (Subcutaneous Tissue) Exposed: No Tendon: No Muscle: No Joint: No Bone: No Large (67-100%)] [N/A:N/A N/A] Treatment Notes Wound #3 (Right Ischium) 2. Periwound Care Skin Prep 3. Primary Dressing Applied Packing strip 4. Secondary Dressing Foam Border Dressing Notes iodoform 1/4" packing strip Electronic Signature(s) Signed: 12/15/2018 6:25:25 PM By: Linton Ham MD Signed: 12/15/2018 6:38:14 PM By: Deon Pilling Entered By: Linton Ham on 12/15/2018 18:05:26 -------------------------------------------------------------------------------- Multi-Disciplinary Care Plan Details Patient Name: Date of Service: ALYRIA, KRACK 12/15/2018 9:45 AM Medical Record [id number] Patient Account Number: [id number] Date of Birth/Sex: Treating RN: 1950-12-31 (68 y.o. Debby Bud Primary Care  Garrin Kirwan: Earlene Plater Other Clinician: Referring Raeshawn Tafolla: Treating James Senn/Extender:Robson, Alvin Critchley, ANDREW Weeks in Treatment: 23 Active Inactive Wound/Skin Impairment Nursing Diagnoses: Knowledge deficit related to ulceration/compromised skin integrity Goals: Patient/caregiver will verbalize understanding of skin care regimen Date Initiated: 07/07/2018 Target Resolution Date: 01/20/2019 Goal Status: Active Interventions: Assess patient/caregiver ability to obtain necessary supplies Assess patient/caregiver ability to perform ulcer/skin care regimen upon admission and as needed Provide education on ulcer and skin care Treatment Activities: Skin care regimen initiated : 07/07/2018 Topical wound management initiated : 07/07/2018 Notes: Electronic Signature(s) Signed: 12/15/2018 6:38:14 PM By: Deon Pilling Entered By: Deon Pilling on 12/15/2018 11:32:08 -------------------------------------------------------------------------------- Pain Assessment Details Patient Name: Date of Service: ATHALEE, ESTERLINE 12/15/2018 9:45 AM Medical Record [id number] Patient Account Number: [id number] Date of Birth/Sex: Treating RN: 1950-02-08 (68 y.o. Nancy Fetter Primary Care Brice Kossman: Earlene Plater Other Clinician: Referring Mischa Pollard: Treating Rodderick Holtzer/Extender:Robson, Alvin Critchley, ANDREW Weeks in Treatment: 23 Active Problems Location of Pain Severity and Description of Pain Patient Has Paino No Site Locations Pain Management and Medication Current Pain Management: Electronic Signature(s) Signed: 12/19/2018 2:18:43 PM By: Levan Hurst RN, BSN Entered By: Levan Hurst on 12/15/2018 11:17:58 -------------------------------------------------------------------------------- Patient/Caregiver Education Details Patient Name: Date of Service: Michelle Holmes 11/19/2020andnbsp9:45 AM Medical Record [id number] Patient Account Number: [id number] Date  of Birth/Gender: Treating RN: 16-Sep-1950 (68 y.o. Debby Bud Primary Care Physician: Earlene Plater Other Clinician: Referring Physician: Treating Physician/Extender:Robson, Alvin Critchley, Jasmine Pang in Treatment: 23 Education Assessment Education Provided To: Patient Education Topics Provided Wound/Skin Impairment: Handouts: Skin Care Do's and Dont's Methods: Explain/Verbal Responses: Reinforcements needed Electronic Signature(s) Signed: 12/15/2018 6:38:14 PM By: Deon Pilling Entered By: Deon Pilling on 12/15/2018 11:32:20 -------------------------------------------------------------------------------- Wound Assessment Details Patient Name: Date of Service: ELGENE, CORAL 12/15/2018 9:45 AM Medical Record [id number] Patient Account Number: [id number] Date of Birth/Sex: Treating RN: [birth year] (68 y.o. Nancy Fetter Primary Care Shaun Zuccaro: Earlene Plater Other Clinician: Referring Ria Redcay: Treating Fayez Sturgell/Extender:Robson, Alvin Critchley, ANDREW Weeks in Treatment: 23 Wound Status Wound Number: 3 Primary Pressure Ulcer Etiology: Wound Location: Right Ischium Wound Open Wounding Event: Pressure Injury Status: Date Acquired: 06/09/2018 Comorbid Anemia, Chronic Obstructive Pulmonary Weeks Of Treatment: 23 History: Disease (COPD), Sleep Apnea, Congestive Clustered Wound: Yes Heart Failure, Coronary Artery Disease, Deep Vein Thrombosis, Hypertension, Myocardial Infarction, Type II Diabetes, Received Chemotherapy, Received Radiation, Confinement Anxiety Photos Wound Measurements Length: (cm) 0.2 % Reduction in A Width: (cm) 0.2 % Reduction in V Depth: (cm) 2 Epithelializatio Clustered Quantity: 1 Tunneling: Area: (cm) 0.031 Undermining: Volume: (cm) 0.063 Wound Description Classification: Category/Stage III Foul Odor After Wound Margin: Well defined, not attached Slough/Fibrino Exudate Amount: Medium Exudate Type:  Serosanguineous Exudate  Color: red, brown Wound Bed Granulation Amount: Large (67-100%) Granulation Quality: Pink Fascia Exposed: Necrotic Amount: None Present (0%) Fat Layer (Subcu Tendon Exposed: Muscle Exposed: Joint Exposed: Bone Expos Cleansing: No No Exposed Structure No taneous Tissue) Exposed: Yes No No No ed: No rea: 98.5% olume: 95.7% n: Large (67-100%) No No Treatment Notes Wound #3 (Right Ischium) 2. Periwound Care Skin Prep 3. Primary Dressing Applied Packing strip 4. Secondary Dressing Foam Border Dressing Notes iodoform 1/4" packing strip Electronic Signature(s) Signed: 12/19/2018 2:18:43 PM By: Levan Hurst RN, BSN Signed: 12/20/2018 7:37:04 AM By: Mikeal Hawthorne EMT/HBOT Entered By: Mikeal Hawthorne on 12/19/2018 09:38:37 -------------------------------------------------------------------------------- Wound Assessment Details Patient Name: Date of Service: AUDRYANNA, ZURITA 12/15/2018 9:45 AM Medical Record [id number] Patient Account Number: [id number] Date of Birth/Sex: Treating RN: 1950/06/28 (68 y.o. Nancy Fetter Primary Care Zuha Dejonge: Earlene Plater Other Clinician: Referring Rhianne Soman: Treating Elye Harmsen/Extender:Robson, Alvin Critchley, ANDREW Weeks in Treatment: 23 Wound Status Wound Number: 6 Primary Abrasion Etiology: Wound Location: Right Back Wound Healed - Epithelialized Wounding Event: Gradually Appeared Status: Date Acquired: 10/17/2018 Comorbid Anemia, Chronic Obstructive Pulmonary Weeks Of Treatment: 8 History: Disease (COPD), Sleep Apnea, Congestive Clustered Wound: Yes Heart Failure, Coronary Artery Disease, Deep Vein Thrombosis, Hypertension, Myocardial Infarction, Type II Diabetes, Received Chemotherapy, Received Radiation, Confinement Anxiety Photos Wound Measurements Length: (cm) 0 % Reduc Width: (cm) 0 % Reduc Depth: (cm) 0 Epithel Area: (cm) 0 Tunnel Volume: (cm) 0 Underm Wound  Description Full Thickness Without Exposed Support Foul O Classification: Structures Slough Wound Flat and Intact Margin: Exudate Medium Amount: Exudate Serosanguineous Type: Exudate red, brown Color: Wound Bed Granulation Amount: None Present (0%) Necrotic Amount: None Present (0%) Fascia Fat Lay Tendon Muscle Joint E Bone Ex dor After Cleansing: No /Fibrino No Exposed Structure Exposed: No er (Subcutaneous Tissue) Exposed: No Exposed: No Exposed: No xposed: No posed: No tion in Area: 100% tion in Volume: 100% ialization: Large (67-100%) ing: No ining: No Electronic Signature(s) Signed: 12/19/2018 2:18:43 PM By: Levan Hurst RN, BSN Signed: 12/20/2018 7:37:04 AM By: Mikeal Hawthorne EMT/HBOT Entered By: Mikeal Hawthorne on 12/19/2018 09:40:57 -------------------------------------------------------------------------------- Vitals Details Patient Name: Date of Service: Michelle Holmes. 12/15/2018 9:45 AM Medical Record [id number] Patient Account Number: [id number] Date of Birth/Sex: Treating RN: 05-24-1950 (68 y.o. Nancy Fetter Primary Care Keldon Lassen: Earlene Plater Other Clinician: Referring Rakeem Colley: Treating Shanora Christensen/Extender:Robson, Alvin Critchley, ANDREW Weeks in Treatment: 23 Vital Signs Time Taken: 11:20 Temperature (F): 98.2 Height (in): 69 Pulse (bpm): 84 Weight (lbs): 142 Respiratory Rate (breaths/min): 16 Body Mass Index (BMI): 21 Blood Pressure (mmHg): 164/75 Reference Range: 80 - 120 mg / dl Electronic Signature(s) Signed: 12/19/2018 2:18:43 PM By: Levan Hurst RN, BSN Entered By: Levan Hurst on 12/15/2018 11:26:52

## 2018-12-21 DIAGNOSIS — M549 Dorsalgia, unspecified: Secondary | ICD-10-CM | POA: Diagnosis not present

## 2018-12-21 DIAGNOSIS — I130 Hypertensive heart and chronic kidney disease with heart failure and stage 1 through stage 4 chronic kidney disease, or unspecified chronic kidney disease: Secondary | ICD-10-CM | POA: Diagnosis not present

## 2018-12-21 DIAGNOSIS — I69351 Hemiplegia and hemiparesis following cerebral infarction affecting right dominant side: Secondary | ICD-10-CM | POA: Diagnosis not present

## 2018-12-21 DIAGNOSIS — I5032 Chronic diastolic (congestive) heart failure: Secondary | ICD-10-CM | POA: Diagnosis not present

## 2018-12-21 DIAGNOSIS — I2510 Atherosclerotic heart disease of native coronary artery without angina pectoris: Secondary | ICD-10-CM | POA: Diagnosis not present

## 2018-12-21 DIAGNOSIS — N139 Obstructive and reflux uropathy, unspecified: Secondary | ICD-10-CM | POA: Diagnosis not present

## 2018-12-21 DIAGNOSIS — M419 Scoliosis, unspecified: Secondary | ICD-10-CM | POA: Diagnosis not present

## 2018-12-21 DIAGNOSIS — L89313 Pressure ulcer of right buttock, stage 3: Secondary | ICD-10-CM | POA: Diagnosis not present

## 2018-12-21 DIAGNOSIS — E1122 Type 2 diabetes mellitus with diabetic chronic kidney disease: Secondary | ICD-10-CM | POA: Diagnosis not present

## 2018-12-21 DIAGNOSIS — I69391 Dysphagia following cerebral infarction: Secondary | ICD-10-CM | POA: Diagnosis not present

## 2018-12-21 DIAGNOSIS — I69320 Aphasia following cerebral infarction: Secondary | ICD-10-CM | POA: Diagnosis not present

## 2018-12-21 DIAGNOSIS — Z7401 Bed confinement status: Secondary | ICD-10-CM | POA: Diagnosis not present

## 2018-12-21 DIAGNOSIS — I252 Old myocardial infarction: Secondary | ICD-10-CM | POA: Diagnosis not present

## 2018-12-21 DIAGNOSIS — Z466 Encounter for fitting and adjustment of urinary device: Secondary | ICD-10-CM | POA: Diagnosis not present

## 2018-12-21 DIAGNOSIS — M1990 Unspecified osteoarthritis, unspecified site: Secondary | ICD-10-CM | POA: Diagnosis not present

## 2018-12-21 DIAGNOSIS — D631 Anemia in chronic kidney disease: Secondary | ICD-10-CM | POA: Diagnosis not present

## 2018-12-21 DIAGNOSIS — G8929 Other chronic pain: Secondary | ICD-10-CM | POA: Diagnosis not present

## 2018-12-21 DIAGNOSIS — N184 Chronic kidney disease, stage 4 (severe): Secondary | ICD-10-CM | POA: Diagnosis not present

## 2018-12-21 DIAGNOSIS — Z87440 Personal history of urinary (tract) infections: Secondary | ICD-10-CM | POA: Diagnosis not present

## 2018-12-21 DIAGNOSIS — J439 Emphysema, unspecified: Secondary | ICD-10-CM | POA: Diagnosis not present

## 2018-12-21 DIAGNOSIS — R1310 Dysphagia, unspecified: Secondary | ICD-10-CM | POA: Diagnosis not present

## 2018-12-21 DIAGNOSIS — E7800 Pure hypercholesterolemia, unspecified: Secondary | ICD-10-CM | POA: Diagnosis not present

## 2018-12-21 DIAGNOSIS — G4733 Obstructive sleep apnea (adult) (pediatric): Secondary | ICD-10-CM | POA: Diagnosis not present

## 2018-12-21 DIAGNOSIS — E43 Unspecified severe protein-calorie malnutrition: Secondary | ICD-10-CM | POA: Diagnosis not present

## 2018-12-23 DIAGNOSIS — J449 Chronic obstructive pulmonary disease, unspecified: Secondary | ICD-10-CM | POA: Diagnosis not present

## 2018-12-26 DIAGNOSIS — N139 Obstructive and reflux uropathy, unspecified: Secondary | ICD-10-CM | POA: Diagnosis not present

## 2018-12-26 DIAGNOSIS — L89313 Pressure ulcer of right buttock, stage 3: Secondary | ICD-10-CM | POA: Diagnosis not present

## 2018-12-26 DIAGNOSIS — E43 Unspecified severe protein-calorie malnutrition: Secondary | ICD-10-CM | POA: Diagnosis not present

## 2018-12-26 DIAGNOSIS — E1169 Type 2 diabetes mellitus with other specified complication: Secondary | ICD-10-CM | POA: Diagnosis not present

## 2018-12-26 DIAGNOSIS — Z452 Encounter for adjustment and management of vascular access device: Secondary | ICD-10-CM | POA: Diagnosis not present

## 2018-12-26 DIAGNOSIS — N184 Chronic kidney disease, stage 4 (severe): Secondary | ICD-10-CM | POA: Diagnosis not present

## 2018-12-26 DIAGNOSIS — M549 Dorsalgia, unspecified: Secondary | ICD-10-CM | POA: Diagnosis not present

## 2018-12-26 DIAGNOSIS — R1310 Dysphagia, unspecified: Secondary | ICD-10-CM | POA: Diagnosis not present

## 2018-12-26 DIAGNOSIS — I252 Old myocardial infarction: Secondary | ICD-10-CM | POA: Diagnosis not present

## 2018-12-26 DIAGNOSIS — M419 Scoliosis, unspecified: Secondary | ICD-10-CM | POA: Diagnosis not present

## 2018-12-26 DIAGNOSIS — I5032 Chronic diastolic (congestive) heart failure: Secondary | ICD-10-CM | POA: Diagnosis not present

## 2018-12-26 DIAGNOSIS — G8929 Other chronic pain: Secondary | ICD-10-CM | POA: Diagnosis not present

## 2018-12-26 DIAGNOSIS — M1990 Unspecified osteoarthritis, unspecified site: Secondary | ICD-10-CM | POA: Diagnosis not present

## 2018-12-26 DIAGNOSIS — I69320 Aphasia following cerebral infarction: Secondary | ICD-10-CM | POA: Diagnosis not present

## 2018-12-26 DIAGNOSIS — I69391 Dysphagia following cerebral infarction: Secondary | ICD-10-CM | POA: Diagnosis not present

## 2018-12-26 DIAGNOSIS — G4733 Obstructive sleep apnea (adult) (pediatric): Secondary | ICD-10-CM | POA: Diagnosis not present

## 2018-12-26 DIAGNOSIS — D631 Anemia in chronic kidney disease: Secondary | ICD-10-CM | POA: Diagnosis not present

## 2018-12-26 DIAGNOSIS — I69351 Hemiplegia and hemiparesis following cerebral infarction affecting right dominant side: Secondary | ICD-10-CM | POA: Diagnosis not present

## 2018-12-26 DIAGNOSIS — E7800 Pure hypercholesterolemia, unspecified: Secondary | ICD-10-CM | POA: Diagnosis not present

## 2018-12-26 DIAGNOSIS — I2510 Atherosclerotic heart disease of native coronary artery without angina pectoris: Secondary | ICD-10-CM | POA: Diagnosis not present

## 2018-12-26 DIAGNOSIS — J439 Emphysema, unspecified: Secondary | ICD-10-CM | POA: Diagnosis not present

## 2018-12-26 DIAGNOSIS — Z466 Encounter for fitting and adjustment of urinary device: Secondary | ICD-10-CM | POA: Diagnosis not present

## 2018-12-26 DIAGNOSIS — M869 Osteomyelitis, unspecified: Secondary | ICD-10-CM | POA: Diagnosis not present

## 2018-12-26 DIAGNOSIS — E1122 Type 2 diabetes mellitus with diabetic chronic kidney disease: Secondary | ICD-10-CM | POA: Diagnosis not present

## 2018-12-26 DIAGNOSIS — I130 Hypertensive heart and chronic kidney disease with heart failure and stage 1 through stage 4 chronic kidney disease, or unspecified chronic kidney disease: Secondary | ICD-10-CM | POA: Diagnosis not present

## 2018-12-27 DIAGNOSIS — M150 Primary generalized (osteo)arthritis: Secondary | ICD-10-CM | POA: Diagnosis not present

## 2018-12-27 DIAGNOSIS — G8111 Spastic hemiplegia affecting right dominant side: Secondary | ICD-10-CM | POA: Diagnosis not present

## 2018-12-27 DIAGNOSIS — I639 Cerebral infarction, unspecified: Secondary | ICD-10-CM | POA: Diagnosis not present

## 2018-12-29 ENCOUNTER — Ambulatory Visit (HOSPITAL_COMMUNITY): Admission: RE | Admit: 2018-12-29 | Payer: Medicare Other | Source: Ambulatory Visit

## 2018-12-30 DIAGNOSIS — I69320 Aphasia following cerebral infarction: Secondary | ICD-10-CM | POA: Diagnosis not present

## 2018-12-30 DIAGNOSIS — I252 Old myocardial infarction: Secondary | ICD-10-CM | POA: Diagnosis not present

## 2018-12-30 DIAGNOSIS — I5032 Chronic diastolic (congestive) heart failure: Secondary | ICD-10-CM | POA: Diagnosis not present

## 2018-12-30 DIAGNOSIS — R1310 Dysphagia, unspecified: Secondary | ICD-10-CM | POA: Diagnosis not present

## 2018-12-30 DIAGNOSIS — Z452 Encounter for adjustment and management of vascular access device: Secondary | ICD-10-CM | POA: Diagnosis not present

## 2018-12-30 DIAGNOSIS — G4733 Obstructive sleep apnea (adult) (pediatric): Secondary | ICD-10-CM | POA: Diagnosis not present

## 2018-12-30 DIAGNOSIS — E43 Unspecified severe protein-calorie malnutrition: Secondary | ICD-10-CM | POA: Diagnosis not present

## 2018-12-30 DIAGNOSIS — M1990 Unspecified osteoarthritis, unspecified site: Secondary | ICD-10-CM | POA: Diagnosis not present

## 2018-12-30 DIAGNOSIS — E7800 Pure hypercholesterolemia, unspecified: Secondary | ICD-10-CM | POA: Diagnosis not present

## 2018-12-30 DIAGNOSIS — E1169 Type 2 diabetes mellitus with other specified complication: Secondary | ICD-10-CM | POA: Diagnosis not present

## 2018-12-30 DIAGNOSIS — N139 Obstructive and reflux uropathy, unspecified: Secondary | ICD-10-CM | POA: Diagnosis not present

## 2018-12-30 DIAGNOSIS — N184 Chronic kidney disease, stage 4 (severe): Secondary | ICD-10-CM | POA: Diagnosis not present

## 2018-12-30 DIAGNOSIS — M549 Dorsalgia, unspecified: Secondary | ICD-10-CM | POA: Diagnosis not present

## 2018-12-30 DIAGNOSIS — J439 Emphysema, unspecified: Secondary | ICD-10-CM | POA: Diagnosis not present

## 2018-12-30 DIAGNOSIS — R339 Retention of urine, unspecified: Secondary | ICD-10-CM | POA: Diagnosis not present

## 2018-12-30 DIAGNOSIS — Z466 Encounter for fitting and adjustment of urinary device: Secondary | ICD-10-CM | POA: Diagnosis not present

## 2018-12-30 DIAGNOSIS — I69351 Hemiplegia and hemiparesis following cerebral infarction affecting right dominant side: Secondary | ICD-10-CM | POA: Diagnosis not present

## 2018-12-30 DIAGNOSIS — M419 Scoliosis, unspecified: Secondary | ICD-10-CM | POA: Diagnosis not present

## 2018-12-30 DIAGNOSIS — M869 Osteomyelitis, unspecified: Secondary | ICD-10-CM | POA: Diagnosis not present

## 2018-12-30 DIAGNOSIS — I130 Hypertensive heart and chronic kidney disease with heart failure and stage 1 through stage 4 chronic kidney disease, or unspecified chronic kidney disease: Secondary | ICD-10-CM | POA: Diagnosis not present

## 2018-12-30 DIAGNOSIS — D631 Anemia in chronic kidney disease: Secondary | ICD-10-CM | POA: Diagnosis not present

## 2018-12-30 DIAGNOSIS — E1122 Type 2 diabetes mellitus with diabetic chronic kidney disease: Secondary | ICD-10-CM | POA: Diagnosis not present

## 2018-12-30 DIAGNOSIS — G8929 Other chronic pain: Secondary | ICD-10-CM | POA: Diagnosis not present

## 2018-12-30 DIAGNOSIS — I2510 Atherosclerotic heart disease of native coronary artery without angina pectoris: Secondary | ICD-10-CM | POA: Diagnosis not present

## 2018-12-30 DIAGNOSIS — N319 Neuromuscular dysfunction of bladder, unspecified: Secondary | ICD-10-CM | POA: Diagnosis not present

## 2018-12-30 DIAGNOSIS — L89313 Pressure ulcer of right buttock, stage 3: Secondary | ICD-10-CM | POA: Diagnosis not present

## 2018-12-30 DIAGNOSIS — I69391 Dysphagia following cerebral infarction: Secondary | ICD-10-CM | POA: Diagnosis not present

## 2019-01-02 DIAGNOSIS — E1122 Type 2 diabetes mellitus with diabetic chronic kidney disease: Secondary | ICD-10-CM | POA: Diagnosis not present

## 2019-01-02 DIAGNOSIS — I69351 Hemiplegia and hemiparesis following cerebral infarction affecting right dominant side: Secondary | ICD-10-CM | POA: Diagnosis not present

## 2019-01-02 DIAGNOSIS — I2510 Atherosclerotic heart disease of native coronary artery without angina pectoris: Secondary | ICD-10-CM | POA: Diagnosis not present

## 2019-01-02 DIAGNOSIS — I252 Old myocardial infarction: Secondary | ICD-10-CM | POA: Diagnosis not present

## 2019-01-02 DIAGNOSIS — N184 Chronic kidney disease, stage 4 (severe): Secondary | ICD-10-CM | POA: Diagnosis not present

## 2019-01-02 DIAGNOSIS — D631 Anemia in chronic kidney disease: Secondary | ICD-10-CM | POA: Diagnosis not present

## 2019-01-02 DIAGNOSIS — L89313 Pressure ulcer of right buttock, stage 3: Secondary | ICD-10-CM | POA: Diagnosis not present

## 2019-01-02 DIAGNOSIS — M1990 Unspecified osteoarthritis, unspecified site: Secondary | ICD-10-CM | POA: Diagnosis not present

## 2019-01-02 DIAGNOSIS — I69391 Dysphagia following cerebral infarction: Secondary | ICD-10-CM | POA: Diagnosis not present

## 2019-01-02 DIAGNOSIS — G4733 Obstructive sleep apnea (adult) (pediatric): Secondary | ICD-10-CM | POA: Diagnosis not present

## 2019-01-02 DIAGNOSIS — J439 Emphysema, unspecified: Secondary | ICD-10-CM | POA: Diagnosis not present

## 2019-01-02 DIAGNOSIS — E43 Unspecified severe protein-calorie malnutrition: Secondary | ICD-10-CM | POA: Diagnosis not present

## 2019-01-02 DIAGNOSIS — I69320 Aphasia following cerebral infarction: Secondary | ICD-10-CM | POA: Diagnosis not present

## 2019-01-02 DIAGNOSIS — Z452 Encounter for adjustment and management of vascular access device: Secondary | ICD-10-CM | POA: Diagnosis not present

## 2019-01-02 DIAGNOSIS — G8929 Other chronic pain: Secondary | ICD-10-CM | POA: Diagnosis not present

## 2019-01-02 DIAGNOSIS — M869 Osteomyelitis, unspecified: Secondary | ICD-10-CM | POA: Diagnosis not present

## 2019-01-02 DIAGNOSIS — R1310 Dysphagia, unspecified: Secondary | ICD-10-CM | POA: Diagnosis not present

## 2019-01-02 DIAGNOSIS — M419 Scoliosis, unspecified: Secondary | ICD-10-CM | POA: Diagnosis not present

## 2019-01-02 DIAGNOSIS — I130 Hypertensive heart and chronic kidney disease with heart failure and stage 1 through stage 4 chronic kidney disease, or unspecified chronic kidney disease: Secondary | ICD-10-CM | POA: Diagnosis not present

## 2019-01-02 DIAGNOSIS — Z466 Encounter for fitting and adjustment of urinary device: Secondary | ICD-10-CM | POA: Diagnosis not present

## 2019-01-02 DIAGNOSIS — N139 Obstructive and reflux uropathy, unspecified: Secondary | ICD-10-CM | POA: Diagnosis not present

## 2019-01-02 DIAGNOSIS — I5032 Chronic diastolic (congestive) heart failure: Secondary | ICD-10-CM | POA: Diagnosis not present

## 2019-01-02 DIAGNOSIS — E7800 Pure hypercholesterolemia, unspecified: Secondary | ICD-10-CM | POA: Diagnosis not present

## 2019-01-02 DIAGNOSIS — E1169 Type 2 diabetes mellitus with other specified complication: Secondary | ICD-10-CM | POA: Diagnosis not present

## 2019-01-02 DIAGNOSIS — M549 Dorsalgia, unspecified: Secondary | ICD-10-CM | POA: Diagnosis not present

## 2019-01-04 DIAGNOSIS — M549 Dorsalgia, unspecified: Secondary | ICD-10-CM | POA: Diagnosis not present

## 2019-01-04 DIAGNOSIS — I69351 Hemiplegia and hemiparesis following cerebral infarction affecting right dominant side: Secondary | ICD-10-CM | POA: Diagnosis not present

## 2019-01-04 DIAGNOSIS — E43 Unspecified severe protein-calorie malnutrition: Secondary | ICD-10-CM | POA: Diagnosis not present

## 2019-01-04 DIAGNOSIS — G4733 Obstructive sleep apnea (adult) (pediatric): Secondary | ICD-10-CM | POA: Diagnosis not present

## 2019-01-04 DIAGNOSIS — I69320 Aphasia following cerebral infarction: Secondary | ICD-10-CM | POA: Diagnosis not present

## 2019-01-04 DIAGNOSIS — M869 Osteomyelitis, unspecified: Secondary | ICD-10-CM | POA: Diagnosis not present

## 2019-01-04 DIAGNOSIS — M1990 Unspecified osteoarthritis, unspecified site: Secondary | ICD-10-CM | POA: Diagnosis not present

## 2019-01-04 DIAGNOSIS — Z452 Encounter for adjustment and management of vascular access device: Secondary | ICD-10-CM | POA: Diagnosis not present

## 2019-01-04 DIAGNOSIS — L89313 Pressure ulcer of right buttock, stage 3: Secondary | ICD-10-CM | POA: Diagnosis not present

## 2019-01-04 DIAGNOSIS — N184 Chronic kidney disease, stage 4 (severe): Secondary | ICD-10-CM | POA: Diagnosis not present

## 2019-01-04 DIAGNOSIS — D631 Anemia in chronic kidney disease: Secondary | ICD-10-CM | POA: Diagnosis not present

## 2019-01-04 DIAGNOSIS — G8929 Other chronic pain: Secondary | ICD-10-CM | POA: Diagnosis not present

## 2019-01-04 DIAGNOSIS — N139 Obstructive and reflux uropathy, unspecified: Secondary | ICD-10-CM | POA: Diagnosis not present

## 2019-01-04 DIAGNOSIS — R1310 Dysphagia, unspecified: Secondary | ICD-10-CM | POA: Diagnosis not present

## 2019-01-04 DIAGNOSIS — I5032 Chronic diastolic (congestive) heart failure: Secondary | ICD-10-CM | POA: Diagnosis not present

## 2019-01-04 DIAGNOSIS — Z466 Encounter for fitting and adjustment of urinary device: Secondary | ICD-10-CM | POA: Diagnosis not present

## 2019-01-04 DIAGNOSIS — I69391 Dysphagia following cerebral infarction: Secondary | ICD-10-CM | POA: Diagnosis not present

## 2019-01-04 DIAGNOSIS — E1169 Type 2 diabetes mellitus with other specified complication: Secondary | ICD-10-CM | POA: Diagnosis not present

## 2019-01-04 DIAGNOSIS — I130 Hypertensive heart and chronic kidney disease with heart failure and stage 1 through stage 4 chronic kidney disease, or unspecified chronic kidney disease: Secondary | ICD-10-CM | POA: Diagnosis not present

## 2019-01-04 DIAGNOSIS — I2510 Atherosclerotic heart disease of native coronary artery without angina pectoris: Secondary | ICD-10-CM | POA: Diagnosis not present

## 2019-01-04 DIAGNOSIS — E7800 Pure hypercholesterolemia, unspecified: Secondary | ICD-10-CM | POA: Diagnosis not present

## 2019-01-04 DIAGNOSIS — I252 Old myocardial infarction: Secondary | ICD-10-CM | POA: Diagnosis not present

## 2019-01-04 DIAGNOSIS — J439 Emphysema, unspecified: Secondary | ICD-10-CM | POA: Diagnosis not present

## 2019-01-04 DIAGNOSIS — E1122 Type 2 diabetes mellitus with diabetic chronic kidney disease: Secondary | ICD-10-CM | POA: Diagnosis not present

## 2019-01-04 DIAGNOSIS — M419 Scoliosis, unspecified: Secondary | ICD-10-CM | POA: Diagnosis not present

## 2019-01-06 DIAGNOSIS — M1990 Unspecified osteoarthritis, unspecified site: Secondary | ICD-10-CM | POA: Diagnosis not present

## 2019-01-06 DIAGNOSIS — I69320 Aphasia following cerebral infarction: Secondary | ICD-10-CM | POA: Diagnosis not present

## 2019-01-06 DIAGNOSIS — E7800 Pure hypercholesterolemia, unspecified: Secondary | ICD-10-CM | POA: Diagnosis not present

## 2019-01-06 DIAGNOSIS — L89313 Pressure ulcer of right buttock, stage 3: Secondary | ICD-10-CM | POA: Diagnosis not present

## 2019-01-06 DIAGNOSIS — E43 Unspecified severe protein-calorie malnutrition: Secondary | ICD-10-CM | POA: Diagnosis not present

## 2019-01-06 DIAGNOSIS — I69351 Hemiplegia and hemiparesis following cerebral infarction affecting right dominant side: Secondary | ICD-10-CM | POA: Diagnosis not present

## 2019-01-06 DIAGNOSIS — M419 Scoliosis, unspecified: Secondary | ICD-10-CM | POA: Diagnosis not present

## 2019-01-06 DIAGNOSIS — M549 Dorsalgia, unspecified: Secondary | ICD-10-CM | POA: Diagnosis not present

## 2019-01-06 DIAGNOSIS — M869 Osteomyelitis, unspecified: Secondary | ICD-10-CM | POA: Diagnosis not present

## 2019-01-06 DIAGNOSIS — G8929 Other chronic pain: Secondary | ICD-10-CM | POA: Diagnosis not present

## 2019-01-06 DIAGNOSIS — N184 Chronic kidney disease, stage 4 (severe): Secondary | ICD-10-CM | POA: Diagnosis not present

## 2019-01-06 DIAGNOSIS — N139 Obstructive and reflux uropathy, unspecified: Secondary | ICD-10-CM | POA: Diagnosis not present

## 2019-01-06 DIAGNOSIS — J439 Emphysema, unspecified: Secondary | ICD-10-CM | POA: Diagnosis not present

## 2019-01-06 DIAGNOSIS — I130 Hypertensive heart and chronic kidney disease with heart failure and stage 1 through stage 4 chronic kidney disease, or unspecified chronic kidney disease: Secondary | ICD-10-CM | POA: Diagnosis not present

## 2019-01-06 DIAGNOSIS — E1122 Type 2 diabetes mellitus with diabetic chronic kidney disease: Secondary | ICD-10-CM | POA: Diagnosis not present

## 2019-01-06 DIAGNOSIS — Z452 Encounter for adjustment and management of vascular access device: Secondary | ICD-10-CM | POA: Diagnosis not present

## 2019-01-06 DIAGNOSIS — I69391 Dysphagia following cerebral infarction: Secondary | ICD-10-CM | POA: Diagnosis not present

## 2019-01-06 DIAGNOSIS — I252 Old myocardial infarction: Secondary | ICD-10-CM | POA: Diagnosis not present

## 2019-01-06 DIAGNOSIS — Z466 Encounter for fitting and adjustment of urinary device: Secondary | ICD-10-CM | POA: Diagnosis not present

## 2019-01-06 DIAGNOSIS — E1169 Type 2 diabetes mellitus with other specified complication: Secondary | ICD-10-CM | POA: Diagnosis not present

## 2019-01-06 DIAGNOSIS — D631 Anemia in chronic kidney disease: Secondary | ICD-10-CM | POA: Diagnosis not present

## 2019-01-06 DIAGNOSIS — G4733 Obstructive sleep apnea (adult) (pediatric): Secondary | ICD-10-CM | POA: Diagnosis not present

## 2019-01-06 DIAGNOSIS — I5032 Chronic diastolic (congestive) heart failure: Secondary | ICD-10-CM | POA: Diagnosis not present

## 2019-01-06 DIAGNOSIS — I2510 Atherosclerotic heart disease of native coronary artery without angina pectoris: Secondary | ICD-10-CM | POA: Diagnosis not present

## 2019-01-06 DIAGNOSIS — R1310 Dysphagia, unspecified: Secondary | ICD-10-CM | POA: Diagnosis not present

## 2019-01-09 ENCOUNTER — Other Ambulatory Visit: Payer: Self-pay | Admitting: Internal Medicine

## 2019-01-09 ENCOUNTER — Telehealth: Payer: Self-pay

## 2019-01-09 DIAGNOSIS — E7800 Pure hypercholesterolemia, unspecified: Secondary | ICD-10-CM | POA: Diagnosis not present

## 2019-01-09 DIAGNOSIS — M419 Scoliosis, unspecified: Secondary | ICD-10-CM | POA: Diagnosis not present

## 2019-01-09 DIAGNOSIS — I130 Hypertensive heart and chronic kidney disease with heart failure and stage 1 through stage 4 chronic kidney disease, or unspecified chronic kidney disease: Secondary | ICD-10-CM | POA: Diagnosis not present

## 2019-01-09 DIAGNOSIS — N184 Chronic kidney disease, stage 4 (severe): Secondary | ICD-10-CM | POA: Diagnosis not present

## 2019-01-09 DIAGNOSIS — L89313 Pressure ulcer of right buttock, stage 3: Secondary | ICD-10-CM | POA: Diagnosis not present

## 2019-01-09 DIAGNOSIS — E43 Unspecified severe protein-calorie malnutrition: Secondary | ICD-10-CM | POA: Diagnosis not present

## 2019-01-09 DIAGNOSIS — M549 Dorsalgia, unspecified: Secondary | ICD-10-CM | POA: Diagnosis not present

## 2019-01-09 DIAGNOSIS — D631 Anemia in chronic kidney disease: Secondary | ICD-10-CM | POA: Diagnosis not present

## 2019-01-09 DIAGNOSIS — E1169 Type 2 diabetes mellitus with other specified complication: Secondary | ICD-10-CM | POA: Diagnosis not present

## 2019-01-09 DIAGNOSIS — J439 Emphysema, unspecified: Secondary | ICD-10-CM | POA: Diagnosis not present

## 2019-01-09 DIAGNOSIS — Z466 Encounter for fitting and adjustment of urinary device: Secondary | ICD-10-CM | POA: Diagnosis not present

## 2019-01-09 DIAGNOSIS — G8929 Other chronic pain: Secondary | ICD-10-CM | POA: Diagnosis not present

## 2019-01-09 DIAGNOSIS — E1122 Type 2 diabetes mellitus with diabetic chronic kidney disease: Secondary | ICD-10-CM | POA: Diagnosis not present

## 2019-01-09 DIAGNOSIS — I2510 Atherosclerotic heart disease of native coronary artery without angina pectoris: Secondary | ICD-10-CM | POA: Diagnosis not present

## 2019-01-09 DIAGNOSIS — G4733 Obstructive sleep apnea (adult) (pediatric): Secondary | ICD-10-CM | POA: Diagnosis not present

## 2019-01-09 DIAGNOSIS — Z452 Encounter for adjustment and management of vascular access device: Secondary | ICD-10-CM | POA: Diagnosis not present

## 2019-01-09 DIAGNOSIS — I69320 Aphasia following cerebral infarction: Secondary | ICD-10-CM | POA: Diagnosis not present

## 2019-01-09 DIAGNOSIS — M1990 Unspecified osteoarthritis, unspecified site: Secondary | ICD-10-CM | POA: Diagnosis not present

## 2019-01-09 DIAGNOSIS — N139 Obstructive and reflux uropathy, unspecified: Secondary | ICD-10-CM | POA: Diagnosis not present

## 2019-01-09 DIAGNOSIS — I69391 Dysphagia following cerebral infarction: Secondary | ICD-10-CM | POA: Diagnosis not present

## 2019-01-09 DIAGNOSIS — I69351 Hemiplegia and hemiparesis following cerebral infarction affecting right dominant side: Secondary | ICD-10-CM | POA: Diagnosis not present

## 2019-01-09 DIAGNOSIS — M4628 Osteomyelitis of vertebra, sacral and sacrococcygeal region: Secondary | ICD-10-CM

## 2019-01-09 DIAGNOSIS — M869 Osteomyelitis, unspecified: Secondary | ICD-10-CM | POA: Diagnosis not present

## 2019-01-09 DIAGNOSIS — I252 Old myocardial infarction: Secondary | ICD-10-CM | POA: Diagnosis not present

## 2019-01-09 DIAGNOSIS — I5032 Chronic diastolic (congestive) heart failure: Secondary | ICD-10-CM | POA: Diagnosis not present

## 2019-01-09 DIAGNOSIS — R1310 Dysphagia, unspecified: Secondary | ICD-10-CM | POA: Diagnosis not present

## 2019-01-09 NOTE — Telephone Encounter (Signed)
Refill Request   Oxycodone HCl 10 MG TABS  [id number] - [place], [place] - [address] AT [address]

## 2019-01-09 NOTE — Telephone Encounter (Signed)
Spoke with Dr. Baxter Flattery regarding this today.  Patient no showed first appointment on 12/29/18. Dr. Baxter Flattery will place new orders and patient rescheduled for 01/16/19 [name], LPN

## 2019-01-09 NOTE — Telephone Encounter (Signed)
Received call from [facility] regarding PIcc removal orders for patient.  Dr. Baxter Flattery verbally made aware of orders needed and she will also place orders to oral antibiotics. Patient scheduled for picc removal 01/16/19 @ 11am and will also need f/u appointment with Dr. Baxter Flattery. Attempted to call daughter to make her aware, left voicemail to call the office regarding picc appointment and f/u appointment with Dr. Baxter Flattery. Advance made aware of picc removal scheduled date/time [name]

## 2019-01-09 NOTE — Telephone Encounter (Signed)
Spoke with Patient's daughter, Michelle Holmes and made her ware of IR appointment and evisit appointment with Dr. Baxter Flattery.  Michelle Holmes verbalized understanding of both appointment and did not have any questions or concerns.  [name]

## 2019-01-10 ENCOUNTER — Telehealth (HOSPITAL_COMMUNITY): Payer: Self-pay

## 2019-01-10 NOTE — Telephone Encounter (Signed)
Pt daughter is calling back; [phone]

## 2019-01-10 NOTE — Telephone Encounter (Signed)
Called pt to reschedule picc removal. Pt stated that she is already scheduled in radiology on 12/20. I do not have her scheduled but see that there is an appt schedule for 12/21 in medical day for a picc removal. The pt was very confused. I told her I would call [facility] to clarify. I did call Shaquenia at the office and left vm for her to return call. AW

## 2019-01-10 NOTE — Telephone Encounter (Signed)
Called daughter back, she is asking for pt pain med. Also ask about referral to [facility], states pt is having some issues and she will discuss w/ [facility] and call imc back if needed

## 2019-01-11 DIAGNOSIS — N184 Chronic kidney disease, stage 4 (severe): Secondary | ICD-10-CM | POA: Diagnosis not present

## 2019-01-11 DIAGNOSIS — I69351 Hemiplegia and hemiparesis following cerebral infarction affecting right dominant side: Secondary | ICD-10-CM | POA: Diagnosis not present

## 2019-01-11 DIAGNOSIS — G8929 Other chronic pain: Secondary | ICD-10-CM | POA: Diagnosis not present

## 2019-01-11 DIAGNOSIS — I2510 Atherosclerotic heart disease of native coronary artery without angina pectoris: Secondary | ICD-10-CM | POA: Diagnosis not present

## 2019-01-11 DIAGNOSIS — M1990 Unspecified osteoarthritis, unspecified site: Secondary | ICD-10-CM | POA: Diagnosis not present

## 2019-01-11 DIAGNOSIS — J439 Emphysema, unspecified: Secondary | ICD-10-CM | POA: Diagnosis not present

## 2019-01-11 DIAGNOSIS — I69320 Aphasia following cerebral infarction: Secondary | ICD-10-CM | POA: Diagnosis not present

## 2019-01-11 DIAGNOSIS — E43 Unspecified severe protein-calorie malnutrition: Secondary | ICD-10-CM | POA: Diagnosis not present

## 2019-01-11 DIAGNOSIS — N139 Obstructive and reflux uropathy, unspecified: Secondary | ICD-10-CM | POA: Diagnosis not present

## 2019-01-11 DIAGNOSIS — Z452 Encounter for adjustment and management of vascular access device: Secondary | ICD-10-CM | POA: Diagnosis not present

## 2019-01-11 DIAGNOSIS — I252 Old myocardial infarction: Secondary | ICD-10-CM | POA: Diagnosis not present

## 2019-01-11 DIAGNOSIS — E7800 Pure hypercholesterolemia, unspecified: Secondary | ICD-10-CM | POA: Diagnosis not present

## 2019-01-11 DIAGNOSIS — I69391 Dysphagia following cerebral infarction: Secondary | ICD-10-CM | POA: Diagnosis not present

## 2019-01-11 DIAGNOSIS — E1169 Type 2 diabetes mellitus with other specified complication: Secondary | ICD-10-CM | POA: Diagnosis not present

## 2019-01-11 DIAGNOSIS — M419 Scoliosis, unspecified: Secondary | ICD-10-CM | POA: Diagnosis not present

## 2019-01-11 DIAGNOSIS — Z466 Encounter for fitting and adjustment of urinary device: Secondary | ICD-10-CM | POA: Diagnosis not present

## 2019-01-11 DIAGNOSIS — M869 Osteomyelitis, unspecified: Secondary | ICD-10-CM | POA: Diagnosis not present

## 2019-01-11 DIAGNOSIS — D631 Anemia in chronic kidney disease: Secondary | ICD-10-CM | POA: Diagnosis not present

## 2019-01-11 DIAGNOSIS — I5032 Chronic diastolic (congestive) heart failure: Secondary | ICD-10-CM | POA: Diagnosis not present

## 2019-01-11 DIAGNOSIS — I130 Hypertensive heart and chronic kidney disease with heart failure and stage 1 through stage 4 chronic kidney disease, or unspecified chronic kidney disease: Secondary | ICD-10-CM | POA: Diagnosis not present

## 2019-01-11 DIAGNOSIS — E1122 Type 2 diabetes mellitus with diabetic chronic kidney disease: Secondary | ICD-10-CM | POA: Diagnosis not present

## 2019-01-11 DIAGNOSIS — G4733 Obstructive sleep apnea (adult) (pediatric): Secondary | ICD-10-CM | POA: Diagnosis not present

## 2019-01-11 DIAGNOSIS — M549 Dorsalgia, unspecified: Secondary | ICD-10-CM | POA: Diagnosis not present

## 2019-01-11 DIAGNOSIS — R1310 Dysphagia, unspecified: Secondary | ICD-10-CM | POA: Diagnosis not present

## 2019-01-11 DIAGNOSIS — L89313 Pressure ulcer of right buttock, stage 3: Secondary | ICD-10-CM | POA: Diagnosis not present

## 2019-01-12 ENCOUNTER — Encounter (HOSPITAL_BASED_OUTPATIENT_CLINIC_OR_DEPARTMENT_OTHER): Payer: Medicare Other | Admitting: Internal Medicine

## 2019-01-13 ENCOUNTER — Other Ambulatory Visit: Payer: Self-pay | Admitting: *Deleted

## 2019-01-13 ENCOUNTER — Telehealth: Payer: Self-pay | Admitting: *Deleted

## 2019-01-13 DIAGNOSIS — M4628 Osteomyelitis of vertebra, sacral and sacrococcygeal region: Secondary | ICD-10-CM

## 2019-01-13 NOTE — Progress Notes (Signed)
Michelle Holmes called from [facility] requesting [facility] order to remove tunneled IJ for her upcoming appointment 12/21. Please cosign. Landis Gandy, RN

## 2019-01-13 NOTE — Telephone Encounter (Signed)
RN received call asking to place orders for Tunneled PICC removal scheduled 12/21.  RN placed order for cosign by Dr Baxter Flattery. RN received another call stating that the phone call on 12/14 scheduled the patient with Short Stay instead of IR.  Patient must be scheduled with IR for a tunneled PICC removal, so the appointment 12/21 with [facility] has been cancelled. RN called patient to let her know, left message. RN called Caryl Pina in IR to see if this can be scheduled. Landis Gandy, RN

## 2019-01-16 ENCOUNTER — Telehealth: Payer: Self-pay

## 2019-01-16 ENCOUNTER — Encounter (HOSPITAL_COMMUNITY): Payer: Self-pay | Admitting: *Deleted

## 2019-01-16 ENCOUNTER — Inpatient Hospital Stay (HOSPITAL_COMMUNITY): Admission: RE | Admit: 2019-01-16 | Payer: Medicare Other | Source: Ambulatory Visit

## 2019-01-16 MED ORDER — OXYCODONE HCL 10 MG PO TABS: 10.0000 mg | ORAL_TABLET | Freq: Two times a day (BID) | ORAL | 0 refills | Status: DC | PRN

## 2019-01-16 NOTE — Telephone Encounter (Signed)
RN called Caryl Pina in IR, scheduled patient for first available appointment for PICC removal - 12/24 at 2:00 (arrival 1:30).  RN relayed to patient's daughter who willc all to see if transportation is available that day.

## 2019-01-16 NOTE — Telephone Encounter (Signed)
Attempted to call [facility] home [facility] to inform them picc removal is rescheduled for 12/24. Also to have additional dressing dropped off for patient. Left voicemail with home health. [place]

## 2019-01-16 NOTE — Progress Notes (Signed)
Pt daughter, [name] called this RN regarding an appt today to have mothers PICC line removed. She states that her mother is having uncontrollable diarrhea and she was not sure if she should come or reschedule. Advised daughter that she should reschedule. Daughter agrees. However, daughter is concerned that she does not have another dressing change kit for the pt PICC line and the nurse is coming out to change the dressing today. I called ashley, our scheduler, and she states she will contact [facility] to make sure the pt has the needed supplies. Pt is rescheduled for PICC removal on 12/24.  Daughter phone number [phone]

## 2019-01-16 NOTE — Telephone Encounter (Signed)
I have refilled one month oxycodone. It looks to me that referral was placed for [facility] on 11/23. Will copy Chilon here to see if we can check on progress.

## 2019-01-17 ENCOUNTER — Other Ambulatory Visit: Payer: Self-pay | Admitting: Radiology

## 2019-01-18 ENCOUNTER — Other Ambulatory Visit: Payer: Self-pay

## 2019-01-18 ENCOUNTER — Ambulatory Visit (INDEPENDENT_AMBULATORY_CARE_PROVIDER_SITE_OTHER): Payer: Medicare Other | Admitting: Internal Medicine

## 2019-01-18 DIAGNOSIS — E43 Unspecified severe protein-calorie malnutrition: Secondary | ICD-10-CM | POA: Diagnosis not present

## 2019-01-18 DIAGNOSIS — G4733 Obstructive sleep apnea (adult) (pediatric): Secondary | ICD-10-CM | POA: Diagnosis not present

## 2019-01-18 DIAGNOSIS — I69351 Hemiplegia and hemiparesis following cerebral infarction affecting right dominant side: Secondary | ICD-10-CM | POA: Diagnosis not present

## 2019-01-18 DIAGNOSIS — N184 Chronic kidney disease, stage 4 (severe): Secondary | ICD-10-CM | POA: Diagnosis not present

## 2019-01-18 DIAGNOSIS — I69391 Dysphagia following cerebral infarction: Secondary | ICD-10-CM | POA: Diagnosis not present

## 2019-01-18 DIAGNOSIS — N139 Obstructive and reflux uropathy, unspecified: Secondary | ICD-10-CM | POA: Diagnosis not present

## 2019-01-18 DIAGNOSIS — M1990 Unspecified osteoarthritis, unspecified site: Secondary | ICD-10-CM | POA: Diagnosis not present

## 2019-01-18 DIAGNOSIS — G8929 Other chronic pain: Secondary | ICD-10-CM | POA: Diagnosis not present

## 2019-01-18 DIAGNOSIS — Z466 Encounter for fitting and adjustment of urinary device: Secondary | ICD-10-CM | POA: Diagnosis not present

## 2019-01-18 DIAGNOSIS — M869 Osteomyelitis, unspecified: Secondary | ICD-10-CM | POA: Diagnosis not present

## 2019-01-18 DIAGNOSIS — L89313 Pressure ulcer of right buttock, stage 3: Secondary | ICD-10-CM | POA: Diagnosis not present

## 2019-01-18 DIAGNOSIS — I69320 Aphasia following cerebral infarction: Secondary | ICD-10-CM | POA: Diagnosis not present

## 2019-01-18 DIAGNOSIS — E1169 Type 2 diabetes mellitus with other specified complication: Secondary | ICD-10-CM | POA: Diagnosis not present

## 2019-01-18 DIAGNOSIS — M549 Dorsalgia, unspecified: Secondary | ICD-10-CM | POA: Diagnosis not present

## 2019-01-18 DIAGNOSIS — I252 Old myocardial infarction: Secondary | ICD-10-CM | POA: Diagnosis not present

## 2019-01-18 DIAGNOSIS — R1310 Dysphagia, unspecified: Secondary | ICD-10-CM | POA: Diagnosis not present

## 2019-01-18 DIAGNOSIS — D631 Anemia in chronic kidney disease: Secondary | ICD-10-CM | POA: Diagnosis not present

## 2019-01-18 DIAGNOSIS — E1122 Type 2 diabetes mellitus with diabetic chronic kidney disease: Secondary | ICD-10-CM | POA: Diagnosis not present

## 2019-01-18 DIAGNOSIS — M419 Scoliosis, unspecified: Secondary | ICD-10-CM | POA: Diagnosis not present

## 2019-01-18 DIAGNOSIS — Z452 Encounter for adjustment and management of vascular access device: Secondary | ICD-10-CM | POA: Diagnosis not present

## 2019-01-18 DIAGNOSIS — E7800 Pure hypercholesterolemia, unspecified: Secondary | ICD-10-CM | POA: Diagnosis not present

## 2019-01-18 DIAGNOSIS — M4628 Osteomyelitis of vertebra, sacral and sacrococcygeal region: Secondary | ICD-10-CM | POA: Diagnosis not present

## 2019-01-18 DIAGNOSIS — J439 Emphysema, unspecified: Secondary | ICD-10-CM | POA: Diagnosis not present

## 2019-01-18 DIAGNOSIS — I130 Hypertensive heart and chronic kidney disease with heart failure and stage 1 through stage 4 chronic kidney disease, or unspecified chronic kidney disease: Secondary | ICD-10-CM | POA: Diagnosis not present

## 2019-01-18 DIAGNOSIS — I2510 Atherosclerotic heart disease of native coronary artery without angina pectoris: Secondary | ICD-10-CM | POA: Diagnosis not present

## 2019-01-18 DIAGNOSIS — I5032 Chronic diastolic (congestive) heart failure: Secondary | ICD-10-CM | POA: Diagnosis not present

## 2019-01-18 MED ORDER — FOSFOMYCIN TROMETHAMINE 3 G PO PACK: 3.0000 g | PACK | Freq: Once | ORAL | 0 refills | Status: AC

## 2019-01-18 NOTE — Progress Notes (Signed)
RFV: follow up for sacral wound/decub osteomyelitis. Phone visit. Identified by name and dob  Patient ID: Michelle Holmes, female   DOB: [birth year], 68 y.o.   MRN: [id number]  HPI Michelle Holmes is a cared for by her daughter. 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 She has completed her course of iv abtx without difficulty. She continues to get wound care at home and follows up with IM clinic.  Outpatient Encounter Medications as of 01/18/2019  Medication Sig  . acetaminophen (TYLENOL) 500 MG tablet Take 500 mg by mouth every 6 (six) hours as needed for moderate pain.  Marland Kitchen amLODipine (NORVASC) 10 MG tablet Take 1 tablet (10 mg total) by mouth daily.  Marland Kitchen atorvastatin (LIPITOR) 20 MG tablet Take 1 tablet (20 mg total) by mouth daily.  . collagenase (SANTYL) ointment Apply 1 application topically daily.  . Diapers & Supplies MISC Please provide patient with adult diapers and pads  . feeding supplement, ENSURE COMPLETE, (ENSURE COMPLETE) LIQD Take 237 mLs by mouth 2 (two) times daily between meals.  . Oxycodone HCl 10 MG TABS Take 1 tablet (10 mg total) by mouth 2 (two) times daily as needed.  . Vitamin D, Ergocalciferol, (DRISDOL) 50000 units CAPS capsule Take 1 capsule (50,000 Units total) by mouth every 7 (seven) days. 8 weeks.   No facility-administered encounter medications on file as of 01/18/2019.     Patient Active Problem List   Diagnosis Date Noted  . Need for immunization against influenza 02/28/2018  . Need for 23-polyvalent pneumococcal polysaccharide vaccine 02/28/2018  . Lipoma of left upper extremity 02/28/2018  . Stroke (cerebrum) ([place]) 08/04/2016  . Long-term current use of opiate analgesic 03/05/2016  . Essential hypertension 03/05/2016  . Chronic diastolic (congestive) heart failure ([place]) 01/28/2016  . Protein-calorie malnutrition, severe 01/28/2016  . Healthcare maintenance   .  Palliative care by specialist   . Anemia   . Acute urinary retention   . Chronic kidney disease (CKD), stage IV (severe) ([place]) 07/06/2015  . 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 Thrombocytopenia ([place])   . Dysarthria   . HLD (hyperlipidemia)   . Chronic obstructive pulmonary disease ([place])   . Hemiparesis, aphasia, and dysphagia as late effect of cerebrovascular accident (CVA) ([place])   . Right hemiplegia ([place])   . Lower abdominal pain 06/20/2014  . OSA on CPAP 12/08/2010  . CAD (coronary artery disease) 12/08/2010  . Controlled diabetes mellitus type 2 with complications ([place]) 07/22/9483     Health Maintenance Due  Topic Date Due  . Hepatitis C Screening  [birth year]  . OPHTHALMOLOGY EXAM  03/03/1961  . URINE MICROALBUMIN  03/03/1961  . TETANUS/TDAP  03/03/1970  . COLONOSCOPY  03/03/2001  . MAMMOGRAM  03/29/2008  . DEXA SCAN  03/03/2016  . INFLUENZA VACCINE  08/27/2018  . HEMOGLOBIN A1C  08/29/2018     Review of Systems Unable to obtain patient not responding to questions/daughter responding for her Physical Exam   There were no vitals taken for this visit.  No physical exam by televiist CBC Lab Results  Component Value Date   WBC 9.2 12/16/2018   RBC 3.01 (L) 12/16/2018   HGB 9.1 (L) 12/16/2018   HCT 27.3 (L) 12/16/2018   PLT 287 12/16/2018   MCV 91 12/16/2018   MCH 30.2 12/16/2018   MCHC 33.3 12/16/2018   RDW 13.2 12/16/2018   LYMPHSABS 1.7 08/16/2017   MONOABS 0.6  08/16/2017   EOSABS 0.0 08/16/2017    BMET Lab Results  Component Value Date   NA 142 12/16/2018   K 5.2 12/16/2018   CL 106 12/16/2018   CO2 17 (L) 12/16/2018   GLUCOSE 126 (H) 12/16/2018   BUN 65 (H) 12/16/2018   CREATININE 4.13 (H) 12/16/2018   CALCIUM 9.7 12/16/2018   GFRNONAA 11 (L) 12/16/2018   GFRAA 12 (L) 12/16/2018      Assessment and Plan  Sacral osteomyelitis = completed iv abtx and now just recommend to continue with local Wound care. Recommend for her to come back  to ID clinic if PCP feels needs further care

## 2019-01-18 NOTE — Telephone Encounter (Signed)
Spoke with Linn @ [facility]  The pt was contacted on 01/02/2019 and was offered to schedule an appointment.  The patient's caregiver (Daughter)" Declined an appointment and did not wish to schedule in person appointment with their office,  due to COVID-19 and might call back later to schedule with their office." Pt's referral will remain open with their office for 1 month.

## 2019-01-19 ENCOUNTER — Ambulatory Visit (HOSPITAL_COMMUNITY): Admission: RE | Admit: 2019-01-19 | Payer: Medicare Other | Source: Ambulatory Visit

## 2019-01-22 DIAGNOSIS — J449 Chronic obstructive pulmonary disease, unspecified: Secondary | ICD-10-CM | POA: Diagnosis not present

## 2019-01-23 ENCOUNTER — Telehealth: Payer: Self-pay | Admitting: Pharmacy Technician

## 2019-01-23 ENCOUNTER — Telehealth: Payer: Self-pay

## 2019-01-23 DIAGNOSIS — I69320 Aphasia following cerebral infarction: Secondary | ICD-10-CM | POA: Diagnosis not present

## 2019-01-23 DIAGNOSIS — E1122 Type 2 diabetes mellitus with diabetic chronic kidney disease: Secondary | ICD-10-CM | POA: Diagnosis not present

## 2019-01-23 DIAGNOSIS — I2510 Atherosclerotic heart disease of native coronary artery without angina pectoris: Secondary | ICD-10-CM | POA: Diagnosis not present

## 2019-01-23 DIAGNOSIS — D631 Anemia in chronic kidney disease: Secondary | ICD-10-CM | POA: Diagnosis not present

## 2019-01-23 DIAGNOSIS — N184 Chronic kidney disease, stage 4 (severe): Secondary | ICD-10-CM | POA: Diagnosis not present

## 2019-01-23 DIAGNOSIS — I69351 Hemiplegia and hemiparesis following cerebral infarction affecting right dominant side: Secondary | ICD-10-CM | POA: Diagnosis not present

## 2019-01-23 DIAGNOSIS — M1990 Unspecified osteoarthritis, unspecified site: Secondary | ICD-10-CM | POA: Diagnosis not present

## 2019-01-23 DIAGNOSIS — L89313 Pressure ulcer of right buttock, stage 3: Secondary | ICD-10-CM | POA: Diagnosis not present

## 2019-01-23 DIAGNOSIS — J439 Emphysema, unspecified: Secondary | ICD-10-CM | POA: Diagnosis not present

## 2019-01-23 DIAGNOSIS — I252 Old myocardial infarction: Secondary | ICD-10-CM | POA: Diagnosis not present

## 2019-01-23 DIAGNOSIS — I5032 Chronic diastolic (congestive) heart failure: Secondary | ICD-10-CM | POA: Diagnosis not present

## 2019-01-23 DIAGNOSIS — M549 Dorsalgia, unspecified: Secondary | ICD-10-CM | POA: Diagnosis not present

## 2019-01-23 DIAGNOSIS — Z452 Encounter for adjustment and management of vascular access device: Secondary | ICD-10-CM | POA: Diagnosis not present

## 2019-01-23 DIAGNOSIS — M419 Scoliosis, unspecified: Secondary | ICD-10-CM | POA: Diagnosis not present

## 2019-01-23 DIAGNOSIS — Z466 Encounter for fitting and adjustment of urinary device: Secondary | ICD-10-CM | POA: Diagnosis not present

## 2019-01-23 DIAGNOSIS — M869 Osteomyelitis, unspecified: Secondary | ICD-10-CM | POA: Diagnosis not present

## 2019-01-23 DIAGNOSIS — N139 Obstructive and reflux uropathy, unspecified: Secondary | ICD-10-CM | POA: Diagnosis not present

## 2019-01-23 DIAGNOSIS — E7800 Pure hypercholesterolemia, unspecified: Secondary | ICD-10-CM | POA: Diagnosis not present

## 2019-01-23 DIAGNOSIS — I69391 Dysphagia following cerebral infarction: Secondary | ICD-10-CM | POA: Diagnosis not present

## 2019-01-23 DIAGNOSIS — E1169 Type 2 diabetes mellitus with other specified complication: Secondary | ICD-10-CM | POA: Diagnosis not present

## 2019-01-23 DIAGNOSIS — I130 Hypertensive heart and chronic kidney disease with heart failure and stage 1 through stage 4 chronic kidney disease, or unspecified chronic kidney disease: Secondary | ICD-10-CM | POA: Diagnosis not present

## 2019-01-23 DIAGNOSIS — E43 Unspecified severe protein-calorie malnutrition: Secondary | ICD-10-CM | POA: Diagnosis not present

## 2019-01-23 DIAGNOSIS — R1310 Dysphagia, unspecified: Secondary | ICD-10-CM | POA: Diagnosis not present

## 2019-01-23 DIAGNOSIS — G8929 Other chronic pain: Secondary | ICD-10-CM | POA: Diagnosis not present

## 2019-01-23 DIAGNOSIS — G4733 Obstructive sleep apnea (adult) (pediatric): Secondary | ICD-10-CM | POA: Diagnosis not present

## 2019-01-23 NOTE — Telephone Encounter (Signed)
LPN called [name] in regards to no show appointment on 01/19/19 for picc removal. [name] states [facility] medical transportation services had dates/times mixed and it was an error on their end.  LPN call Caryl Pina with [facility] and was able to get patient rescheduled for tunnel picc removal 01/26/2019 2:00 PM-- [facility] .  LPN also call [facility] Lattie Haw and LVM with information.   Denise verbalized understanding of new appointment date/time and will set up transportation with [place]

## 2019-01-23 NOTE — Telephone Encounter (Addendum)
[facility] Patient Advocate Encounter   Received notification from [employer] that prior authorization for Fosfomycin Tromethamine 3 gm packets is required.   PA submitted on 01/23/2019 Key [id number] Status is denied    [facility] will continue to follow.  Bartholomew Crews, CPhT Specialty Pharmacy Patient [facility] Phone: [phone] Fax: [phone] 01/23/2019 4:10 PM

## 2019-01-25 DIAGNOSIS — G4733 Obstructive sleep apnea (adult) (pediatric): Secondary | ICD-10-CM | POA: Diagnosis not present

## 2019-01-25 DIAGNOSIS — E7800 Pure hypercholesterolemia, unspecified: Secondary | ICD-10-CM | POA: Diagnosis not present

## 2019-01-25 DIAGNOSIS — D631 Anemia in chronic kidney disease: Secondary | ICD-10-CM | POA: Diagnosis not present

## 2019-01-25 DIAGNOSIS — I69391 Dysphagia following cerebral infarction: Secondary | ICD-10-CM | POA: Diagnosis not present

## 2019-01-25 DIAGNOSIS — I130 Hypertensive heart and chronic kidney disease with heart failure and stage 1 through stage 4 chronic kidney disease, or unspecified chronic kidney disease: Secondary | ICD-10-CM | POA: Diagnosis not present

## 2019-01-25 DIAGNOSIS — L89313 Pressure ulcer of right buttock, stage 3: Secondary | ICD-10-CM | POA: Diagnosis not present

## 2019-01-25 DIAGNOSIS — N139 Obstructive and reflux uropathy, unspecified: Secondary | ICD-10-CM | POA: Diagnosis not present

## 2019-01-25 DIAGNOSIS — I69320 Aphasia following cerebral infarction: Secondary | ICD-10-CM | POA: Diagnosis not present

## 2019-01-25 DIAGNOSIS — M869 Osteomyelitis, unspecified: Secondary | ICD-10-CM | POA: Diagnosis not present

## 2019-01-25 DIAGNOSIS — N184 Chronic kidney disease, stage 4 (severe): Secondary | ICD-10-CM | POA: Diagnosis not present

## 2019-01-25 DIAGNOSIS — E43 Unspecified severe protein-calorie malnutrition: Secondary | ICD-10-CM | POA: Diagnosis not present

## 2019-01-25 DIAGNOSIS — I252 Old myocardial infarction: Secondary | ICD-10-CM | POA: Diagnosis not present

## 2019-01-25 DIAGNOSIS — Z466 Encounter for fitting and adjustment of urinary device: Secondary | ICD-10-CM | POA: Diagnosis not present

## 2019-01-25 DIAGNOSIS — E1169 Type 2 diabetes mellitus with other specified complication: Secondary | ICD-10-CM | POA: Diagnosis not present

## 2019-01-25 DIAGNOSIS — M419 Scoliosis, unspecified: Secondary | ICD-10-CM | POA: Diagnosis not present

## 2019-01-25 DIAGNOSIS — M549 Dorsalgia, unspecified: Secondary | ICD-10-CM | POA: Diagnosis not present

## 2019-01-25 DIAGNOSIS — M1990 Unspecified osteoarthritis, unspecified site: Secondary | ICD-10-CM | POA: Diagnosis not present

## 2019-01-25 DIAGNOSIS — G8929 Other chronic pain: Secondary | ICD-10-CM | POA: Diagnosis not present

## 2019-01-25 DIAGNOSIS — R1310 Dysphagia, unspecified: Secondary | ICD-10-CM | POA: Diagnosis not present

## 2019-01-25 DIAGNOSIS — I69351 Hemiplegia and hemiparesis following cerebral infarction affecting right dominant side: Secondary | ICD-10-CM | POA: Diagnosis not present

## 2019-01-25 DIAGNOSIS — I2510 Atherosclerotic heart disease of native coronary artery without angina pectoris: Secondary | ICD-10-CM | POA: Diagnosis not present

## 2019-01-25 DIAGNOSIS — E1122 Type 2 diabetes mellitus with diabetic chronic kidney disease: Secondary | ICD-10-CM | POA: Diagnosis not present

## 2019-01-25 DIAGNOSIS — J439 Emphysema, unspecified: Secondary | ICD-10-CM | POA: Diagnosis not present

## 2019-01-25 DIAGNOSIS — I5032 Chronic diastolic (congestive) heart failure: Secondary | ICD-10-CM | POA: Diagnosis not present

## 2019-01-25 DIAGNOSIS — Z452 Encounter for adjustment and management of vascular access device: Secondary | ICD-10-CM | POA: Diagnosis not present

## 2019-01-26 ENCOUNTER — Other Ambulatory Visit: Payer: Self-pay

## 2019-01-26 ENCOUNTER — Ambulatory Visit (HOSPITAL_COMMUNITY)
Admission: RE | Admit: 2019-01-26 | Discharge: 2019-01-26 | Disposition: A | Discharge status: Home / Self Care | Payer: Medicare Other | Source: Ambulatory Visit | Attending: Internal Medicine | Admitting: Internal Medicine

## 2019-01-26 DIAGNOSIS — Z452 Encounter for adjustment and management of vascular access device: Secondary | ICD-10-CM | POA: Insufficient documentation

## 2019-01-26 DIAGNOSIS — M4628 Osteomyelitis of vertebra, sacral and sacrococcygeal region: Secondary | ICD-10-CM | POA: Insufficient documentation

## 2019-01-26 HISTORY — PX: IR REMOVAL TUN CV CATH W/O FL: IMG2289

## 2019-01-26 MED ORDER — CHLORHEXIDINE GLUCONATE 4 % EX LIQD
CUTANEOUS | Status: AC
  Filled 2019-01-26: qty 15

## 2019-01-26 MED ORDER — LIDOCAINE HCL (PF) 1 % IJ SOLN
INTRAMUSCULAR | Status: DC | PRN
  Administered 2019-01-26: 10 mL

## 2019-01-26 MED ORDER — LIDOCAINE HCL 1 % IJ SOLN
INTRAMUSCULAR | Status: AC
  Filled 2019-01-26: qty 20

## 2019-01-26 NOTE — Procedures (Signed)
Left IJ tunneled CVC was removed in its entirety without immediate complications. Medications used- 1% lidocaine to skin/SQ tissue. Gauze dressing applied to site afterwards. EBL < 1 cc.

## 2019-01-30 DIAGNOSIS — I69391 Dysphagia following cerebral infarction: Secondary | ICD-10-CM | POA: Diagnosis not present

## 2019-01-30 DIAGNOSIS — M1990 Unspecified osteoarthritis, unspecified site: Secondary | ICD-10-CM | POA: Diagnosis not present

## 2019-01-30 DIAGNOSIS — I639 Cerebral infarction, unspecified: Secondary | ICD-10-CM | POA: Diagnosis not present

## 2019-01-30 DIAGNOSIS — I130 Hypertensive heart and chronic kidney disease with heart failure and stage 1 through stage 4 chronic kidney disease, or unspecified chronic kidney disease: Secondary | ICD-10-CM | POA: Diagnosis not present

## 2019-01-30 DIAGNOSIS — I69351 Hemiplegia and hemiparesis following cerebral infarction affecting right dominant side: Secondary | ICD-10-CM | POA: Diagnosis not present

## 2019-01-30 DIAGNOSIS — I5032 Chronic diastolic (congestive) heart failure: Secondary | ICD-10-CM | POA: Diagnosis not present

## 2019-01-30 DIAGNOSIS — N139 Obstructive and reflux uropathy, unspecified: Secondary | ICD-10-CM | POA: Diagnosis not present

## 2019-01-30 DIAGNOSIS — G8111 Spastic hemiplegia affecting right dominant side: Secondary | ICD-10-CM | POA: Diagnosis not present

## 2019-01-30 DIAGNOSIS — E43 Unspecified severe protein-calorie malnutrition: Secondary | ICD-10-CM | POA: Diagnosis not present

## 2019-01-30 DIAGNOSIS — E7800 Pure hypercholesterolemia, unspecified: Secondary | ICD-10-CM | POA: Diagnosis not present

## 2019-01-30 DIAGNOSIS — L89313 Pressure ulcer of right buttock, stage 3: Secondary | ICD-10-CM | POA: Diagnosis not present

## 2019-01-30 DIAGNOSIS — M549 Dorsalgia, unspecified: Secondary | ICD-10-CM | POA: Diagnosis not present

## 2019-01-30 DIAGNOSIS — G4733 Obstructive sleep apnea (adult) (pediatric): Secondary | ICD-10-CM | POA: Diagnosis not present

## 2019-01-30 DIAGNOSIS — M419 Scoliosis, unspecified: Secondary | ICD-10-CM | POA: Diagnosis not present

## 2019-01-30 DIAGNOSIS — Z452 Encounter for adjustment and management of vascular access device: Secondary | ICD-10-CM | POA: Diagnosis not present

## 2019-01-30 DIAGNOSIS — I2510 Atherosclerotic heart disease of native coronary artery without angina pectoris: Secondary | ICD-10-CM | POA: Diagnosis not present

## 2019-01-30 DIAGNOSIS — Z466 Encounter for fitting and adjustment of urinary device: Secondary | ICD-10-CM | POA: Diagnosis not present

## 2019-01-30 DIAGNOSIS — I69320 Aphasia following cerebral infarction: Secondary | ICD-10-CM | POA: Diagnosis not present

## 2019-01-30 DIAGNOSIS — D631 Anemia in chronic kidney disease: Secondary | ICD-10-CM | POA: Diagnosis not present

## 2019-01-30 DIAGNOSIS — G8929 Other chronic pain: Secondary | ICD-10-CM | POA: Diagnosis not present

## 2019-01-30 DIAGNOSIS — J439 Emphysema, unspecified: Secondary | ICD-10-CM | POA: Diagnosis not present

## 2019-01-30 DIAGNOSIS — E1122 Type 2 diabetes mellitus with diabetic chronic kidney disease: Secondary | ICD-10-CM | POA: Diagnosis not present

## 2019-01-30 DIAGNOSIS — N184 Chronic kidney disease, stage 4 (severe): Secondary | ICD-10-CM | POA: Diagnosis not present

## 2019-01-30 DIAGNOSIS — I252 Old myocardial infarction: Secondary | ICD-10-CM | POA: Diagnosis not present

## 2019-01-30 DIAGNOSIS — M150 Primary generalized (osteo)arthritis: Secondary | ICD-10-CM | POA: Diagnosis not present

## 2019-01-30 DIAGNOSIS — G894 Chronic pain syndrome: Secondary | ICD-10-CM | POA: Diagnosis not present

## 2019-01-30 DIAGNOSIS — M869 Osteomyelitis, unspecified: Secondary | ICD-10-CM | POA: Diagnosis not present

## 2019-01-30 DIAGNOSIS — Z960 Presence of urogenital implants: Secondary | ICD-10-CM | POA: Diagnosis not present

## 2019-01-30 DIAGNOSIS — R1310 Dysphagia, unspecified: Secondary | ICD-10-CM | POA: Diagnosis not present

## 2019-01-30 DIAGNOSIS — Z0000 Encounter for general adult medical examination without abnormal findings: Secondary | ICD-10-CM | POA: Diagnosis not present

## 2019-01-30 DIAGNOSIS — I6930 Unspecified sequelae of cerebral infarction: Secondary | ICD-10-CM | POA: Diagnosis not present

## 2019-01-30 DIAGNOSIS — E118 Type 2 diabetes mellitus with unspecified complications: Secondary | ICD-10-CM | POA: Diagnosis not present

## 2019-01-30 DIAGNOSIS — E1169 Type 2 diabetes mellitus with other specified complication: Secondary | ICD-10-CM | POA: Diagnosis not present

## 2019-01-31 DIAGNOSIS — Z466 Encounter for fitting and adjustment of urinary device: Secondary | ICD-10-CM | POA: Diagnosis not present

## 2019-01-31 DIAGNOSIS — N319 Neuromuscular dysfunction of bladder, unspecified: Secondary | ICD-10-CM | POA: Diagnosis not present

## 2019-01-31 DIAGNOSIS — R339 Retention of urine, unspecified: Secondary | ICD-10-CM | POA: Diagnosis not present

## 2019-02-01 DIAGNOSIS — I69320 Aphasia following cerebral infarction: Secondary | ICD-10-CM | POA: Diagnosis not present

## 2019-02-01 DIAGNOSIS — M419 Scoliosis, unspecified: Secondary | ICD-10-CM | POA: Diagnosis not present

## 2019-02-01 DIAGNOSIS — M1990 Unspecified osteoarthritis, unspecified site: Secondary | ICD-10-CM | POA: Diagnosis not present

## 2019-02-01 DIAGNOSIS — N184 Chronic kidney disease, stage 4 (severe): Secondary | ICD-10-CM | POA: Diagnosis not present

## 2019-02-01 DIAGNOSIS — J439 Emphysema, unspecified: Secondary | ICD-10-CM | POA: Diagnosis not present

## 2019-02-01 DIAGNOSIS — L89313 Pressure ulcer of right buttock, stage 3: Secondary | ICD-10-CM | POA: Diagnosis not present

## 2019-02-01 DIAGNOSIS — Z452 Encounter for adjustment and management of vascular access device: Secondary | ICD-10-CM | POA: Diagnosis not present

## 2019-02-01 DIAGNOSIS — I5032 Chronic diastolic (congestive) heart failure: Secondary | ICD-10-CM | POA: Diagnosis not present

## 2019-02-01 DIAGNOSIS — M869 Osteomyelitis, unspecified: Secondary | ICD-10-CM | POA: Diagnosis not present

## 2019-02-01 DIAGNOSIS — E7800 Pure hypercholesterolemia, unspecified: Secondary | ICD-10-CM | POA: Diagnosis not present

## 2019-02-01 DIAGNOSIS — M549 Dorsalgia, unspecified: Secondary | ICD-10-CM | POA: Diagnosis not present

## 2019-02-01 DIAGNOSIS — R1310 Dysphagia, unspecified: Secondary | ICD-10-CM | POA: Diagnosis not present

## 2019-02-01 DIAGNOSIS — E43 Unspecified severe protein-calorie malnutrition: Secondary | ICD-10-CM | POA: Diagnosis not present

## 2019-02-01 DIAGNOSIS — E1169 Type 2 diabetes mellitus with other specified complication: Secondary | ICD-10-CM | POA: Diagnosis not present

## 2019-02-01 DIAGNOSIS — G8929 Other chronic pain: Secondary | ICD-10-CM | POA: Diagnosis not present

## 2019-02-01 DIAGNOSIS — I69391 Dysphagia following cerebral infarction: Secondary | ICD-10-CM | POA: Diagnosis not present

## 2019-02-01 DIAGNOSIS — G4733 Obstructive sleep apnea (adult) (pediatric): Secondary | ICD-10-CM | POA: Diagnosis not present

## 2019-02-01 DIAGNOSIS — D631 Anemia in chronic kidney disease: Secondary | ICD-10-CM | POA: Diagnosis not present

## 2019-02-01 DIAGNOSIS — I69351 Hemiplegia and hemiparesis following cerebral infarction affecting right dominant side: Secondary | ICD-10-CM | POA: Diagnosis not present

## 2019-02-01 DIAGNOSIS — E1122 Type 2 diabetes mellitus with diabetic chronic kidney disease: Secondary | ICD-10-CM | POA: Diagnosis not present

## 2019-02-01 DIAGNOSIS — Z466 Encounter for fitting and adjustment of urinary device: Secondary | ICD-10-CM | POA: Diagnosis not present

## 2019-02-01 DIAGNOSIS — N139 Obstructive and reflux uropathy, unspecified: Secondary | ICD-10-CM | POA: Diagnosis not present

## 2019-02-01 DIAGNOSIS — I130 Hypertensive heart and chronic kidney disease with heart failure and stage 1 through stage 4 chronic kidney disease, or unspecified chronic kidney disease: Secondary | ICD-10-CM | POA: Diagnosis not present

## 2019-02-01 DIAGNOSIS — I252 Old myocardial infarction: Secondary | ICD-10-CM | POA: Diagnosis not present

## 2019-02-01 DIAGNOSIS — I2510 Atherosclerotic heart disease of native coronary artery without angina pectoris: Secondary | ICD-10-CM | POA: Diagnosis not present

## 2019-02-02 ENCOUNTER — Ambulatory Visit (HOSPITAL_BASED_OUTPATIENT_CLINIC_OR_DEPARTMENT_OTHER): Payer: Medicare Other | Admitting: Internal Medicine

## 2019-02-07 DIAGNOSIS — M549 Dorsalgia, unspecified: Secondary | ICD-10-CM | POA: Diagnosis not present

## 2019-02-07 DIAGNOSIS — E1122 Type 2 diabetes mellitus with diabetic chronic kidney disease: Secondary | ICD-10-CM | POA: Diagnosis not present

## 2019-02-07 DIAGNOSIS — G8929 Other chronic pain: Secondary | ICD-10-CM | POA: Diagnosis not present

## 2019-02-07 DIAGNOSIS — J439 Emphysema, unspecified: Secondary | ICD-10-CM | POA: Diagnosis not present

## 2019-02-07 DIAGNOSIS — I252 Old myocardial infarction: Secondary | ICD-10-CM | POA: Diagnosis not present

## 2019-02-07 DIAGNOSIS — N184 Chronic kidney disease, stage 4 (severe): Secondary | ICD-10-CM | POA: Diagnosis not present

## 2019-02-07 DIAGNOSIS — I69320 Aphasia following cerebral infarction: Secondary | ICD-10-CM | POA: Diagnosis not present

## 2019-02-07 DIAGNOSIS — D631 Anemia in chronic kidney disease: Secondary | ICD-10-CM | POA: Diagnosis not present

## 2019-02-07 DIAGNOSIS — R1310 Dysphagia, unspecified: Secondary | ICD-10-CM | POA: Diagnosis not present

## 2019-02-07 DIAGNOSIS — Z452 Encounter for adjustment and management of vascular access device: Secondary | ICD-10-CM | POA: Diagnosis not present

## 2019-02-07 DIAGNOSIS — I2510 Atherosclerotic heart disease of native coronary artery without angina pectoris: Secondary | ICD-10-CM | POA: Diagnosis not present

## 2019-02-07 DIAGNOSIS — E1169 Type 2 diabetes mellitus with other specified complication: Secondary | ICD-10-CM | POA: Diagnosis not present

## 2019-02-07 DIAGNOSIS — M419 Scoliosis, unspecified: Secondary | ICD-10-CM | POA: Diagnosis not present

## 2019-02-07 DIAGNOSIS — I69351 Hemiplegia and hemiparesis following cerebral infarction affecting right dominant side: Secondary | ICD-10-CM | POA: Diagnosis not present

## 2019-02-07 DIAGNOSIS — I5032 Chronic diastolic (congestive) heart failure: Secondary | ICD-10-CM | POA: Diagnosis not present

## 2019-02-07 DIAGNOSIS — E7800 Pure hypercholesterolemia, unspecified: Secondary | ICD-10-CM | POA: Diagnosis not present

## 2019-02-07 DIAGNOSIS — L89313 Pressure ulcer of right buttock, stage 3: Secondary | ICD-10-CM | POA: Diagnosis not present

## 2019-02-07 DIAGNOSIS — E43 Unspecified severe protein-calorie malnutrition: Secondary | ICD-10-CM | POA: Diagnosis not present

## 2019-02-07 DIAGNOSIS — G4733 Obstructive sleep apnea (adult) (pediatric): Secondary | ICD-10-CM | POA: Diagnosis not present

## 2019-02-07 DIAGNOSIS — N139 Obstructive and reflux uropathy, unspecified: Secondary | ICD-10-CM | POA: Diagnosis not present

## 2019-02-07 DIAGNOSIS — I130 Hypertensive heart and chronic kidney disease with heart failure and stage 1 through stage 4 chronic kidney disease, or unspecified chronic kidney disease: Secondary | ICD-10-CM | POA: Diagnosis not present

## 2019-02-07 DIAGNOSIS — I69391 Dysphagia following cerebral infarction: Secondary | ICD-10-CM | POA: Diagnosis not present

## 2019-02-07 DIAGNOSIS — M869 Osteomyelitis, unspecified: Secondary | ICD-10-CM | POA: Diagnosis not present

## 2019-02-07 DIAGNOSIS — M1990 Unspecified osteoarthritis, unspecified site: Secondary | ICD-10-CM | POA: Diagnosis not present

## 2019-02-07 DIAGNOSIS — Z466 Encounter for fitting and adjustment of urinary device: Secondary | ICD-10-CM | POA: Diagnosis not present

## 2019-02-09 DIAGNOSIS — Z466 Encounter for fitting and adjustment of urinary device: Secondary | ICD-10-CM | POA: Diagnosis not present

## 2019-02-09 DIAGNOSIS — M1990 Unspecified osteoarthritis, unspecified site: Secondary | ICD-10-CM | POA: Diagnosis not present

## 2019-02-09 DIAGNOSIS — I69351 Hemiplegia and hemiparesis following cerebral infarction affecting right dominant side: Secondary | ICD-10-CM | POA: Diagnosis not present

## 2019-02-09 DIAGNOSIS — M549 Dorsalgia, unspecified: Secondary | ICD-10-CM | POA: Diagnosis not present

## 2019-02-09 DIAGNOSIS — Z452 Encounter for adjustment and management of vascular access device: Secondary | ICD-10-CM | POA: Diagnosis not present

## 2019-02-09 DIAGNOSIS — L89313 Pressure ulcer of right buttock, stage 3: Secondary | ICD-10-CM | POA: Diagnosis not present

## 2019-02-09 DIAGNOSIS — G4733 Obstructive sleep apnea (adult) (pediatric): Secondary | ICD-10-CM | POA: Diagnosis not present

## 2019-02-09 DIAGNOSIS — I5032 Chronic diastolic (congestive) heart failure: Secondary | ICD-10-CM | POA: Diagnosis not present

## 2019-02-09 DIAGNOSIS — I69320 Aphasia following cerebral infarction: Secondary | ICD-10-CM | POA: Diagnosis not present

## 2019-02-09 DIAGNOSIS — M419 Scoliosis, unspecified: Secondary | ICD-10-CM | POA: Diagnosis not present

## 2019-02-09 DIAGNOSIS — E7800 Pure hypercholesterolemia, unspecified: Secondary | ICD-10-CM | POA: Diagnosis not present

## 2019-02-09 DIAGNOSIS — I2510 Atherosclerotic heart disease of native coronary artery without angina pectoris: Secondary | ICD-10-CM | POA: Diagnosis not present

## 2019-02-09 DIAGNOSIS — E1169 Type 2 diabetes mellitus with other specified complication: Secondary | ICD-10-CM | POA: Diagnosis not present

## 2019-02-09 DIAGNOSIS — D631 Anemia in chronic kidney disease: Secondary | ICD-10-CM | POA: Diagnosis not present

## 2019-02-09 DIAGNOSIS — I252 Old myocardial infarction: Secondary | ICD-10-CM | POA: Diagnosis not present

## 2019-02-09 DIAGNOSIS — N139 Obstructive and reflux uropathy, unspecified: Secondary | ICD-10-CM | POA: Diagnosis not present

## 2019-02-09 DIAGNOSIS — I69391 Dysphagia following cerebral infarction: Secondary | ICD-10-CM | POA: Diagnosis not present

## 2019-02-09 DIAGNOSIS — N184 Chronic kidney disease, stage 4 (severe): Secondary | ICD-10-CM | POA: Diagnosis not present

## 2019-02-09 DIAGNOSIS — M869 Osteomyelitis, unspecified: Secondary | ICD-10-CM | POA: Diagnosis not present

## 2019-02-09 DIAGNOSIS — E43 Unspecified severe protein-calorie malnutrition: Secondary | ICD-10-CM | POA: Diagnosis not present

## 2019-02-09 DIAGNOSIS — I130 Hypertensive heart and chronic kidney disease with heart failure and stage 1 through stage 4 chronic kidney disease, or unspecified chronic kidney disease: Secondary | ICD-10-CM | POA: Diagnosis not present

## 2019-02-09 DIAGNOSIS — E1122 Type 2 diabetes mellitus with diabetic chronic kidney disease: Secondary | ICD-10-CM | POA: Diagnosis not present

## 2019-02-09 DIAGNOSIS — G8929 Other chronic pain: Secondary | ICD-10-CM | POA: Diagnosis not present

## 2019-02-09 DIAGNOSIS — J439 Emphysema, unspecified: Secondary | ICD-10-CM | POA: Diagnosis not present

## 2019-02-09 DIAGNOSIS — R1310 Dysphagia, unspecified: Secondary | ICD-10-CM | POA: Diagnosis not present

## 2019-02-15 ENCOUNTER — Other Ambulatory Visit: Payer: Self-pay | Admitting: Internal Medicine

## 2019-02-15 DIAGNOSIS — Z452 Encounter for adjustment and management of vascular access device: Secondary | ICD-10-CM | POA: Diagnosis not present

## 2019-02-15 DIAGNOSIS — E1169 Type 2 diabetes mellitus with other specified complication: Secondary | ICD-10-CM | POA: Diagnosis not present

## 2019-02-15 DIAGNOSIS — M869 Osteomyelitis, unspecified: Secondary | ICD-10-CM | POA: Diagnosis not present

## 2019-02-15 DIAGNOSIS — M419 Scoliosis, unspecified: Secondary | ICD-10-CM | POA: Diagnosis not present

## 2019-02-15 DIAGNOSIS — G4733 Obstructive sleep apnea (adult) (pediatric): Secondary | ICD-10-CM | POA: Diagnosis not present

## 2019-02-15 DIAGNOSIS — M1990 Unspecified osteoarthritis, unspecified site: Secondary | ICD-10-CM | POA: Diagnosis not present

## 2019-02-15 DIAGNOSIS — L89313 Pressure ulcer of right buttock, stage 3: Secondary | ICD-10-CM | POA: Diagnosis not present

## 2019-02-15 DIAGNOSIS — I69351 Hemiplegia and hemiparesis following cerebral infarction affecting right dominant side: Secondary | ICD-10-CM | POA: Diagnosis not present

## 2019-02-15 DIAGNOSIS — I69320 Aphasia following cerebral infarction: Secondary | ICD-10-CM | POA: Diagnosis not present

## 2019-02-15 DIAGNOSIS — I252 Old myocardial infarction: Secondary | ICD-10-CM | POA: Diagnosis not present

## 2019-02-15 DIAGNOSIS — J439 Emphysema, unspecified: Secondary | ICD-10-CM | POA: Diagnosis not present

## 2019-02-15 DIAGNOSIS — I69391 Dysphagia following cerebral infarction: Secondary | ICD-10-CM | POA: Diagnosis not present

## 2019-02-15 DIAGNOSIS — G8929 Other chronic pain: Secondary | ICD-10-CM | POA: Diagnosis not present

## 2019-02-15 DIAGNOSIS — R1310 Dysphagia, unspecified: Secondary | ICD-10-CM | POA: Diagnosis not present

## 2019-02-15 DIAGNOSIS — D631 Anemia in chronic kidney disease: Secondary | ICD-10-CM | POA: Diagnosis not present

## 2019-02-15 DIAGNOSIS — E1122 Type 2 diabetes mellitus with diabetic chronic kidney disease: Secondary | ICD-10-CM | POA: Diagnosis not present

## 2019-02-15 DIAGNOSIS — Z466 Encounter for fitting and adjustment of urinary device: Secondary | ICD-10-CM | POA: Diagnosis not present

## 2019-02-15 DIAGNOSIS — N184 Chronic kidney disease, stage 4 (severe): Secondary | ICD-10-CM | POA: Diagnosis not present

## 2019-02-15 DIAGNOSIS — N139 Obstructive and reflux uropathy, unspecified: Secondary | ICD-10-CM | POA: Diagnosis not present

## 2019-02-15 DIAGNOSIS — I2510 Atherosclerotic heart disease of native coronary artery without angina pectoris: Secondary | ICD-10-CM | POA: Diagnosis not present

## 2019-02-15 DIAGNOSIS — E7800 Pure hypercholesterolemia, unspecified: Secondary | ICD-10-CM | POA: Diagnosis not present

## 2019-02-15 DIAGNOSIS — I130 Hypertensive heart and chronic kidney disease with heart failure and stage 1 through stage 4 chronic kidney disease, or unspecified chronic kidney disease: Secondary | ICD-10-CM | POA: Diagnosis not present

## 2019-02-15 DIAGNOSIS — I5032 Chronic diastolic (congestive) heart failure: Secondary | ICD-10-CM | POA: Diagnosis not present

## 2019-02-15 DIAGNOSIS — M549 Dorsalgia, unspecified: Secondary | ICD-10-CM | POA: Diagnosis not present

## 2019-02-15 DIAGNOSIS — E43 Unspecified severe protein-calorie malnutrition: Secondary | ICD-10-CM | POA: Diagnosis not present

## 2019-02-15 NOTE — Telephone Encounter (Signed)
Needs refill on Oxycodone HCl 10 MG TABS  ;pt contact [place], [place]

## 2019-02-16 ENCOUNTER — Telehealth: Payer: Self-pay | Admitting: Internal Medicine

## 2019-02-16 MED ORDER — OXYCODONE HCL 10 MG PO TABS: 10.0000 mg | ORAL_TABLET | Freq: Two times a day (BID) | ORAL | 0 refills | Status: DC | PRN

## 2019-02-16 NOTE — Telephone Encounter (Signed)
Needs refill on Oxycodone HCl 10 MG TABS ;pt contact [place], [place]

## 2019-02-16 NOTE — Telephone Encounter (Signed)
Called pt - informed oxycodone was refilled this am.

## 2019-02-17 ENCOUNTER — Encounter (HOSPITAL_BASED_OUTPATIENT_CLINIC_OR_DEPARTMENT_OTHER): Payer: Medicare Other | Admitting: Internal Medicine

## 2019-02-21 DIAGNOSIS — E1122 Type 2 diabetes mellitus with diabetic chronic kidney disease: Secondary | ICD-10-CM | POA: Diagnosis not present

## 2019-02-21 DIAGNOSIS — I69320 Aphasia following cerebral infarction: Secondary | ICD-10-CM | POA: Diagnosis not present

## 2019-02-21 DIAGNOSIS — N184 Chronic kidney disease, stage 4 (severe): Secondary | ICD-10-CM | POA: Diagnosis not present

## 2019-02-21 DIAGNOSIS — I5032 Chronic diastolic (congestive) heart failure: Secondary | ICD-10-CM | POA: Diagnosis not present

## 2019-02-21 DIAGNOSIS — G4733 Obstructive sleep apnea (adult) (pediatric): Secondary | ICD-10-CM | POA: Diagnosis not present

## 2019-02-21 DIAGNOSIS — N139 Obstructive and reflux uropathy, unspecified: Secondary | ICD-10-CM | POA: Diagnosis not present

## 2019-02-21 DIAGNOSIS — M419 Scoliosis, unspecified: Secondary | ICD-10-CM | POA: Diagnosis not present

## 2019-02-21 DIAGNOSIS — I69391 Dysphagia following cerebral infarction: Secondary | ICD-10-CM | POA: Diagnosis not present

## 2019-02-21 DIAGNOSIS — Z87440 Personal history of urinary (tract) infections: Secondary | ICD-10-CM | POA: Diagnosis not present

## 2019-02-21 DIAGNOSIS — M549 Dorsalgia, unspecified: Secondary | ICD-10-CM | POA: Diagnosis not present

## 2019-02-21 DIAGNOSIS — Z79891 Long term (current) use of opiate analgesic: Secondary | ICD-10-CM | POA: Diagnosis not present

## 2019-02-21 DIAGNOSIS — G8929 Other chronic pain: Secondary | ICD-10-CM | POA: Diagnosis not present

## 2019-02-21 DIAGNOSIS — E43 Unspecified severe protein-calorie malnutrition: Secondary | ICD-10-CM | POA: Diagnosis not present

## 2019-02-21 DIAGNOSIS — Z993 Dependence on wheelchair: Secondary | ICD-10-CM | POA: Diagnosis not present

## 2019-02-21 DIAGNOSIS — R1310 Dysphagia, unspecified: Secondary | ICD-10-CM | POA: Diagnosis not present

## 2019-02-21 DIAGNOSIS — I69351 Hemiplegia and hemiparesis following cerebral infarction affecting right dominant side: Secondary | ICD-10-CM | POA: Diagnosis not present

## 2019-02-21 DIAGNOSIS — I2510 Atherosclerotic heart disease of native coronary artery without angina pectoris: Secondary | ICD-10-CM | POA: Diagnosis not present

## 2019-02-21 DIAGNOSIS — Z466 Encounter for fitting and adjustment of urinary device: Secondary | ICD-10-CM | POA: Diagnosis not present

## 2019-02-21 DIAGNOSIS — D631 Anemia in chronic kidney disease: Secondary | ICD-10-CM | POA: Diagnosis not present

## 2019-02-21 DIAGNOSIS — I130 Hypertensive heart and chronic kidney disease with heart failure and stage 1 through stage 4 chronic kidney disease, or unspecified chronic kidney disease: Secondary | ICD-10-CM | POA: Diagnosis not present

## 2019-02-21 DIAGNOSIS — M1990 Unspecified osteoarthritis, unspecified site: Secondary | ICD-10-CM | POA: Diagnosis not present

## 2019-02-21 DIAGNOSIS — J439 Emphysema, unspecified: Secondary | ICD-10-CM | POA: Diagnosis not present

## 2019-02-21 DIAGNOSIS — E7800 Pure hypercholesterolemia, unspecified: Secondary | ICD-10-CM | POA: Diagnosis not present

## 2019-02-21 DIAGNOSIS — Z9181 History of falling: Secondary | ICD-10-CM | POA: Diagnosis not present

## 2019-02-21 DIAGNOSIS — I252 Old myocardial infarction: Secondary | ICD-10-CM | POA: Diagnosis not present

## 2019-02-22 ENCOUNTER — Telehealth: Payer: Self-pay | Admitting: Internal Medicine

## 2019-02-22 DIAGNOSIS — J449 Chronic obstructive pulmonary disease, unspecified: Secondary | ICD-10-CM | POA: Diagnosis not present

## 2019-02-24 DIAGNOSIS — M549 Dorsalgia, unspecified: Secondary | ICD-10-CM | POA: Diagnosis not present

## 2019-02-24 DIAGNOSIS — N184 Chronic kidney disease, stage 4 (severe): Secondary | ICD-10-CM | POA: Diagnosis not present

## 2019-02-24 DIAGNOSIS — N139 Obstructive and reflux uropathy, unspecified: Secondary | ICD-10-CM | POA: Diagnosis not present

## 2019-02-24 DIAGNOSIS — G4733 Obstructive sleep apnea (adult) (pediatric): Secondary | ICD-10-CM | POA: Diagnosis not present

## 2019-02-24 DIAGNOSIS — Z993 Dependence on wheelchair: Secondary | ICD-10-CM | POA: Diagnosis not present

## 2019-02-24 DIAGNOSIS — I69320 Aphasia following cerebral infarction: Secondary | ICD-10-CM | POA: Diagnosis not present

## 2019-02-24 DIAGNOSIS — I69351 Hemiplegia and hemiparesis following cerebral infarction affecting right dominant side: Secondary | ICD-10-CM | POA: Diagnosis not present

## 2019-02-24 DIAGNOSIS — I130 Hypertensive heart and chronic kidney disease with heart failure and stage 1 through stage 4 chronic kidney disease, or unspecified chronic kidney disease: Secondary | ICD-10-CM | POA: Diagnosis not present

## 2019-02-24 DIAGNOSIS — I69391 Dysphagia following cerebral infarction: Secondary | ICD-10-CM | POA: Diagnosis not present

## 2019-02-24 DIAGNOSIS — M1990 Unspecified osteoarthritis, unspecified site: Secondary | ICD-10-CM | POA: Diagnosis not present

## 2019-02-24 DIAGNOSIS — M419 Scoliosis, unspecified: Secondary | ICD-10-CM | POA: Diagnosis not present

## 2019-02-24 DIAGNOSIS — Z9181 History of falling: Secondary | ICD-10-CM | POA: Diagnosis not present

## 2019-02-24 DIAGNOSIS — I252 Old myocardial infarction: Secondary | ICD-10-CM | POA: Diagnosis not present

## 2019-02-24 DIAGNOSIS — E43 Unspecified severe protein-calorie malnutrition: Secondary | ICD-10-CM | POA: Diagnosis not present

## 2019-02-24 DIAGNOSIS — J439 Emphysema, unspecified: Secondary | ICD-10-CM | POA: Diagnosis not present

## 2019-02-24 DIAGNOSIS — Z466 Encounter for fitting and adjustment of urinary device: Secondary | ICD-10-CM | POA: Diagnosis not present

## 2019-02-24 DIAGNOSIS — N319 Neuromuscular dysfunction of bladder, unspecified: Secondary | ICD-10-CM | POA: Diagnosis not present

## 2019-02-24 DIAGNOSIS — Z79891 Long term (current) use of opiate analgesic: Secondary | ICD-10-CM | POA: Diagnosis not present

## 2019-02-24 DIAGNOSIS — G8929 Other chronic pain: Secondary | ICD-10-CM | POA: Diagnosis not present

## 2019-02-24 DIAGNOSIS — R1310 Dysphagia, unspecified: Secondary | ICD-10-CM | POA: Diagnosis not present

## 2019-02-24 DIAGNOSIS — R339 Retention of urine, unspecified: Secondary | ICD-10-CM | POA: Diagnosis not present

## 2019-02-24 DIAGNOSIS — I5032 Chronic diastolic (congestive) heart failure: Secondary | ICD-10-CM | POA: Diagnosis not present

## 2019-02-24 DIAGNOSIS — Z87440 Personal history of urinary (tract) infections: Secondary | ICD-10-CM | POA: Diagnosis not present

## 2019-02-24 DIAGNOSIS — E1122 Type 2 diabetes mellitus with diabetic chronic kidney disease: Secondary | ICD-10-CM | POA: Diagnosis not present

## 2019-02-24 DIAGNOSIS — D631 Anemia in chronic kidney disease: Secondary | ICD-10-CM | POA: Diagnosis not present

## 2019-02-24 DIAGNOSIS — I2510 Atherosclerotic heart disease of native coronary artery without angina pectoris: Secondary | ICD-10-CM | POA: Diagnosis not present

## 2019-02-24 DIAGNOSIS — E7800 Pure hypercholesterolemia, unspecified: Secondary | ICD-10-CM | POA: Diagnosis not present

## 2019-02-27 DIAGNOSIS — M150 Primary generalized (osteo)arthritis: Secondary | ICD-10-CM | POA: Diagnosis not present

## 2019-02-27 DIAGNOSIS — G8111 Spastic hemiplegia affecting right dominant side: Secondary | ICD-10-CM | POA: Diagnosis not present

## 2019-02-27 DIAGNOSIS — I639 Cerebral infarction, unspecified: Secondary | ICD-10-CM | POA: Diagnosis not present

## 2019-02-28 DIAGNOSIS — E7800 Pure hypercholesterolemia, unspecified: Secondary | ICD-10-CM | POA: Diagnosis not present

## 2019-02-28 DIAGNOSIS — Z87440 Personal history of urinary (tract) infections: Secondary | ICD-10-CM | POA: Diagnosis not present

## 2019-02-28 DIAGNOSIS — Z9181 History of falling: Secondary | ICD-10-CM | POA: Diagnosis not present

## 2019-02-28 DIAGNOSIS — M1990 Unspecified osteoarthritis, unspecified site: Secondary | ICD-10-CM | POA: Diagnosis not present

## 2019-02-28 DIAGNOSIS — I69320 Aphasia following cerebral infarction: Secondary | ICD-10-CM | POA: Diagnosis not present

## 2019-02-28 DIAGNOSIS — I252 Old myocardial infarction: Secondary | ICD-10-CM | POA: Diagnosis not present

## 2019-02-28 DIAGNOSIS — Z466 Encounter for fitting and adjustment of urinary device: Secondary | ICD-10-CM | POA: Diagnosis not present

## 2019-02-28 DIAGNOSIS — D631 Anemia in chronic kidney disease: Secondary | ICD-10-CM | POA: Diagnosis not present

## 2019-02-28 DIAGNOSIS — I2510 Atherosclerotic heart disease of native coronary artery without angina pectoris: Secondary | ICD-10-CM | POA: Diagnosis not present

## 2019-02-28 DIAGNOSIS — Z993 Dependence on wheelchair: Secondary | ICD-10-CM | POA: Diagnosis not present

## 2019-02-28 DIAGNOSIS — I5032 Chronic diastolic (congestive) heart failure: Secondary | ICD-10-CM | POA: Diagnosis not present

## 2019-02-28 DIAGNOSIS — I69391 Dysphagia following cerebral infarction: Secondary | ICD-10-CM | POA: Diagnosis not present

## 2019-02-28 DIAGNOSIS — N139 Obstructive and reflux uropathy, unspecified: Secondary | ICD-10-CM | POA: Diagnosis not present

## 2019-02-28 DIAGNOSIS — I130 Hypertensive heart and chronic kidney disease with heart failure and stage 1 through stage 4 chronic kidney disease, or unspecified chronic kidney disease: Secondary | ICD-10-CM | POA: Diagnosis not present

## 2019-02-28 DIAGNOSIS — G8929 Other chronic pain: Secondary | ICD-10-CM | POA: Diagnosis not present

## 2019-02-28 DIAGNOSIS — J439 Emphysema, unspecified: Secondary | ICD-10-CM | POA: Diagnosis not present

## 2019-02-28 DIAGNOSIS — I69351 Hemiplegia and hemiparesis following cerebral infarction affecting right dominant side: Secondary | ICD-10-CM | POA: Diagnosis not present

## 2019-02-28 DIAGNOSIS — E43 Unspecified severe protein-calorie malnutrition: Secondary | ICD-10-CM | POA: Diagnosis not present

## 2019-02-28 DIAGNOSIS — E1122 Type 2 diabetes mellitus with diabetic chronic kidney disease: Secondary | ICD-10-CM | POA: Diagnosis not present

## 2019-02-28 DIAGNOSIS — R1310 Dysphagia, unspecified: Secondary | ICD-10-CM | POA: Diagnosis not present

## 2019-02-28 DIAGNOSIS — M419 Scoliosis, unspecified: Secondary | ICD-10-CM | POA: Diagnosis not present

## 2019-02-28 DIAGNOSIS — M549 Dorsalgia, unspecified: Secondary | ICD-10-CM | POA: Diagnosis not present

## 2019-02-28 DIAGNOSIS — G4733 Obstructive sleep apnea (adult) (pediatric): Secondary | ICD-10-CM | POA: Diagnosis not present

## 2019-02-28 DIAGNOSIS — N184 Chronic kidney disease, stage 4 (severe): Secondary | ICD-10-CM | POA: Diagnosis not present

## 2019-02-28 DIAGNOSIS — Z79891 Long term (current) use of opiate analgesic: Secondary | ICD-10-CM | POA: Diagnosis not present

## 2019-03-03 DIAGNOSIS — I69351 Hemiplegia and hemiparesis following cerebral infarction affecting right dominant side: Secondary | ICD-10-CM | POA: Diagnosis not present

## 2019-03-03 DIAGNOSIS — G4733 Obstructive sleep apnea (adult) (pediatric): Secondary | ICD-10-CM | POA: Diagnosis not present

## 2019-03-03 DIAGNOSIS — J439 Emphysema, unspecified: Secondary | ICD-10-CM | POA: Diagnosis not present

## 2019-03-03 DIAGNOSIS — I130 Hypertensive heart and chronic kidney disease with heart failure and stage 1 through stage 4 chronic kidney disease, or unspecified chronic kidney disease: Secondary | ICD-10-CM | POA: Diagnosis not present

## 2019-03-03 DIAGNOSIS — Z9181 History of falling: Secondary | ICD-10-CM | POA: Diagnosis not present

## 2019-03-03 DIAGNOSIS — I69391 Dysphagia following cerebral infarction: Secondary | ICD-10-CM | POA: Diagnosis not present

## 2019-03-03 DIAGNOSIS — M549 Dorsalgia, unspecified: Secondary | ICD-10-CM | POA: Diagnosis not present

## 2019-03-03 DIAGNOSIS — M419 Scoliosis, unspecified: Secondary | ICD-10-CM | POA: Diagnosis not present

## 2019-03-03 DIAGNOSIS — Z993 Dependence on wheelchair: Secondary | ICD-10-CM | POA: Diagnosis not present

## 2019-03-03 DIAGNOSIS — G8929 Other chronic pain: Secondary | ICD-10-CM | POA: Diagnosis not present

## 2019-03-03 DIAGNOSIS — Z79891 Long term (current) use of opiate analgesic: Secondary | ICD-10-CM | POA: Diagnosis not present

## 2019-03-03 DIAGNOSIS — E43 Unspecified severe protein-calorie malnutrition: Secondary | ICD-10-CM | POA: Diagnosis not present

## 2019-03-03 DIAGNOSIS — E1122 Type 2 diabetes mellitus with diabetic chronic kidney disease: Secondary | ICD-10-CM | POA: Diagnosis not present

## 2019-03-03 DIAGNOSIS — R1310 Dysphagia, unspecified: Secondary | ICD-10-CM | POA: Diagnosis not present

## 2019-03-03 DIAGNOSIS — E7800 Pure hypercholesterolemia, unspecified: Secondary | ICD-10-CM | POA: Diagnosis not present

## 2019-03-03 DIAGNOSIS — Z87440 Personal history of urinary (tract) infections: Secondary | ICD-10-CM | POA: Diagnosis not present

## 2019-03-03 DIAGNOSIS — N184 Chronic kidney disease, stage 4 (severe): Secondary | ICD-10-CM | POA: Diagnosis not present

## 2019-03-03 DIAGNOSIS — D631 Anemia in chronic kidney disease: Secondary | ICD-10-CM | POA: Diagnosis not present

## 2019-03-03 DIAGNOSIS — M1990 Unspecified osteoarthritis, unspecified site: Secondary | ICD-10-CM | POA: Diagnosis not present

## 2019-03-03 DIAGNOSIS — I252 Old myocardial infarction: Secondary | ICD-10-CM | POA: Diagnosis not present

## 2019-03-03 DIAGNOSIS — I2510 Atherosclerotic heart disease of native coronary artery without angina pectoris: Secondary | ICD-10-CM | POA: Diagnosis not present

## 2019-03-03 DIAGNOSIS — I69320 Aphasia following cerebral infarction: Secondary | ICD-10-CM | POA: Diagnosis not present

## 2019-03-03 DIAGNOSIS — I5032 Chronic diastolic (congestive) heart failure: Secondary | ICD-10-CM | POA: Diagnosis not present

## 2019-03-03 DIAGNOSIS — Z466 Encounter for fitting and adjustment of urinary device: Secondary | ICD-10-CM | POA: Diagnosis not present

## 2019-03-03 DIAGNOSIS — N139 Obstructive and reflux uropathy, unspecified: Secondary | ICD-10-CM | POA: Diagnosis not present

## 2019-03-06 ENCOUNTER — Encounter: Payer: Medicare Other | Admitting: Internal Medicine

## 2019-03-06 NOTE — Progress Notes (Deleted)
   CC: Blood pressure f/u  HPI:  [name] is a 69 y.o. with a past history as noted below who presents today for ***  Past Medical History:  Diagnosis Date  . Anemia   . CHF (congestive heart failure) ([place])   . Chronic back pain   . COPD (chronic obstructive pulmonary disease) ([place])   . Coronary artery disease   . Diabetes mellitus   . DJD (degenerative joint disease)   . Emphysema   . Hypercholesteremia   . Hypertension   . Myocardial infarction ([place])   . Obesity   . Obesity hypoventilation syndrome ([place])   . Renal insufficiency   . Scoliosis   . Sleep apnea   . Stroke ([place])   . TIA (transient ischemic attack)    Review of Systems: All systems were reviewed and are otherwise negative unless mentioned in the HPI.  Physical Exam:  There were no vitals filed for this visit.  General: HEENT: CV: PULM: ABD: NEURO:  Assessment & Plan:   See Encounters Tab for problem based charting.  Patient discussed with Dr. {NAMES:[id number]::"Butcher","Guilloud","Hoffman","Mullen","Narendra","Raines","Vincent"}

## 2019-03-08 DIAGNOSIS — I252 Old myocardial infarction: Secondary | ICD-10-CM | POA: Diagnosis not present

## 2019-03-08 DIAGNOSIS — Z87440 Personal history of urinary (tract) infections: Secondary | ICD-10-CM | POA: Diagnosis not present

## 2019-03-08 DIAGNOSIS — E43 Unspecified severe protein-calorie malnutrition: Secondary | ICD-10-CM | POA: Diagnosis not present

## 2019-03-08 DIAGNOSIS — I2510 Atherosclerotic heart disease of native coronary artery without angina pectoris: Secondary | ICD-10-CM | POA: Diagnosis not present

## 2019-03-08 DIAGNOSIS — M419 Scoliosis, unspecified: Secondary | ICD-10-CM | POA: Diagnosis not present

## 2019-03-08 DIAGNOSIS — Z9181 History of falling: Secondary | ICD-10-CM | POA: Diagnosis not present

## 2019-03-08 DIAGNOSIS — G4733 Obstructive sleep apnea (adult) (pediatric): Secondary | ICD-10-CM | POA: Diagnosis not present

## 2019-03-08 DIAGNOSIS — I69351 Hemiplegia and hemiparesis following cerebral infarction affecting right dominant side: Secondary | ICD-10-CM | POA: Diagnosis not present

## 2019-03-08 DIAGNOSIS — N184 Chronic kidney disease, stage 4 (severe): Secondary | ICD-10-CM | POA: Diagnosis not present

## 2019-03-08 DIAGNOSIS — Z993 Dependence on wheelchair: Secondary | ICD-10-CM | POA: Diagnosis not present

## 2019-03-08 DIAGNOSIS — J439 Emphysema, unspecified: Secondary | ICD-10-CM | POA: Diagnosis not present

## 2019-03-08 DIAGNOSIS — R1310 Dysphagia, unspecified: Secondary | ICD-10-CM | POA: Diagnosis not present

## 2019-03-08 DIAGNOSIS — N139 Obstructive and reflux uropathy, unspecified: Secondary | ICD-10-CM | POA: Diagnosis not present

## 2019-03-08 DIAGNOSIS — Z79891 Long term (current) use of opiate analgesic: Secondary | ICD-10-CM | POA: Diagnosis not present

## 2019-03-08 DIAGNOSIS — G8929 Other chronic pain: Secondary | ICD-10-CM | POA: Diagnosis not present

## 2019-03-08 DIAGNOSIS — E1122 Type 2 diabetes mellitus with diabetic chronic kidney disease: Secondary | ICD-10-CM | POA: Diagnosis not present

## 2019-03-08 DIAGNOSIS — I5032 Chronic diastolic (congestive) heart failure: Secondary | ICD-10-CM | POA: Diagnosis not present

## 2019-03-08 DIAGNOSIS — D631 Anemia in chronic kidney disease: Secondary | ICD-10-CM | POA: Diagnosis not present

## 2019-03-08 DIAGNOSIS — E7800 Pure hypercholesterolemia, unspecified: Secondary | ICD-10-CM | POA: Diagnosis not present

## 2019-03-08 DIAGNOSIS — I130 Hypertensive heart and chronic kidney disease with heart failure and stage 1 through stage 4 chronic kidney disease, or unspecified chronic kidney disease: Secondary | ICD-10-CM | POA: Diagnosis not present

## 2019-03-08 DIAGNOSIS — M549 Dorsalgia, unspecified: Secondary | ICD-10-CM | POA: Diagnosis not present

## 2019-03-08 DIAGNOSIS — I69320 Aphasia following cerebral infarction: Secondary | ICD-10-CM | POA: Diagnosis not present

## 2019-03-08 DIAGNOSIS — Z466 Encounter for fitting and adjustment of urinary device: Secondary | ICD-10-CM | POA: Diagnosis not present

## 2019-03-08 DIAGNOSIS — M1990 Unspecified osteoarthritis, unspecified site: Secondary | ICD-10-CM | POA: Diagnosis not present

## 2019-03-08 DIAGNOSIS — I69391 Dysphagia following cerebral infarction: Secondary | ICD-10-CM | POA: Diagnosis not present

## 2019-03-14 DIAGNOSIS — Z993 Dependence on wheelchair: Secondary | ICD-10-CM | POA: Diagnosis not present

## 2019-03-14 DIAGNOSIS — M549 Dorsalgia, unspecified: Secondary | ICD-10-CM | POA: Diagnosis not present

## 2019-03-14 DIAGNOSIS — I69320 Aphasia following cerebral infarction: Secondary | ICD-10-CM | POA: Diagnosis not present

## 2019-03-14 DIAGNOSIS — E43 Unspecified severe protein-calorie malnutrition: Secondary | ICD-10-CM | POA: Diagnosis not present

## 2019-03-14 DIAGNOSIS — G4733 Obstructive sleep apnea (adult) (pediatric): Secondary | ICD-10-CM | POA: Diagnosis not present

## 2019-03-14 DIAGNOSIS — D631 Anemia in chronic kidney disease: Secondary | ICD-10-CM | POA: Diagnosis not present

## 2019-03-14 DIAGNOSIS — G8929 Other chronic pain: Secondary | ICD-10-CM | POA: Diagnosis not present

## 2019-03-14 DIAGNOSIS — R1310 Dysphagia, unspecified: Secondary | ICD-10-CM | POA: Diagnosis not present

## 2019-03-14 DIAGNOSIS — I5032 Chronic diastolic (congestive) heart failure: Secondary | ICD-10-CM | POA: Diagnosis not present

## 2019-03-14 DIAGNOSIS — I130 Hypertensive heart and chronic kidney disease with heart failure and stage 1 through stage 4 chronic kidney disease, or unspecified chronic kidney disease: Secondary | ICD-10-CM | POA: Diagnosis not present

## 2019-03-14 DIAGNOSIS — Z79891 Long term (current) use of opiate analgesic: Secondary | ICD-10-CM | POA: Diagnosis not present

## 2019-03-14 DIAGNOSIS — N139 Obstructive and reflux uropathy, unspecified: Secondary | ICD-10-CM | POA: Diagnosis not present

## 2019-03-14 DIAGNOSIS — Z466 Encounter for fitting and adjustment of urinary device: Secondary | ICD-10-CM | POA: Diagnosis not present

## 2019-03-14 DIAGNOSIS — M1990 Unspecified osteoarthritis, unspecified site: Secondary | ICD-10-CM | POA: Diagnosis not present

## 2019-03-14 DIAGNOSIS — I69351 Hemiplegia and hemiparesis following cerebral infarction affecting right dominant side: Secondary | ICD-10-CM | POA: Diagnosis not present

## 2019-03-14 DIAGNOSIS — E1122 Type 2 diabetes mellitus with diabetic chronic kidney disease: Secondary | ICD-10-CM | POA: Diagnosis not present

## 2019-03-14 DIAGNOSIS — M419 Scoliosis, unspecified: Secondary | ICD-10-CM | POA: Diagnosis not present

## 2019-03-14 DIAGNOSIS — N184 Chronic kidney disease, stage 4 (severe): Secondary | ICD-10-CM | POA: Diagnosis not present

## 2019-03-14 DIAGNOSIS — I252 Old myocardial infarction: Secondary | ICD-10-CM | POA: Diagnosis not present

## 2019-03-14 DIAGNOSIS — Z87440 Personal history of urinary (tract) infections: Secondary | ICD-10-CM | POA: Diagnosis not present

## 2019-03-14 DIAGNOSIS — Z9181 History of falling: Secondary | ICD-10-CM | POA: Diagnosis not present

## 2019-03-14 DIAGNOSIS — J439 Emphysema, unspecified: Secondary | ICD-10-CM | POA: Diagnosis not present

## 2019-03-14 DIAGNOSIS — E7800 Pure hypercholesterolemia, unspecified: Secondary | ICD-10-CM | POA: Diagnosis not present

## 2019-03-14 DIAGNOSIS — I69391 Dysphagia following cerebral infarction: Secondary | ICD-10-CM | POA: Diagnosis not present

## 2019-03-14 DIAGNOSIS — I2510 Atherosclerotic heart disease of native coronary artery without angina pectoris: Secondary | ICD-10-CM | POA: Diagnosis not present

## 2019-03-15 DIAGNOSIS — I69351 Hemiplegia and hemiparesis following cerebral infarction affecting right dominant side: Secondary | ICD-10-CM | POA: Diagnosis not present

## 2019-03-15 DIAGNOSIS — Z79891 Long term (current) use of opiate analgesic: Secondary | ICD-10-CM | POA: Diagnosis not present

## 2019-03-15 DIAGNOSIS — I69320 Aphasia following cerebral infarction: Secondary | ICD-10-CM | POA: Diagnosis not present

## 2019-03-15 DIAGNOSIS — D631 Anemia in chronic kidney disease: Secondary | ICD-10-CM | POA: Diagnosis not present

## 2019-03-15 DIAGNOSIS — G8929 Other chronic pain: Secondary | ICD-10-CM | POA: Diagnosis not present

## 2019-03-15 DIAGNOSIS — Z993 Dependence on wheelchair: Secondary | ICD-10-CM | POA: Diagnosis not present

## 2019-03-15 DIAGNOSIS — M419 Scoliosis, unspecified: Secondary | ICD-10-CM | POA: Diagnosis not present

## 2019-03-15 DIAGNOSIS — E1122 Type 2 diabetes mellitus with diabetic chronic kidney disease: Secondary | ICD-10-CM | POA: Diagnosis not present

## 2019-03-15 DIAGNOSIS — I5032 Chronic diastolic (congestive) heart failure: Secondary | ICD-10-CM | POA: Diagnosis not present

## 2019-03-15 DIAGNOSIS — E7800 Pure hypercholesterolemia, unspecified: Secondary | ICD-10-CM | POA: Diagnosis not present

## 2019-03-15 DIAGNOSIS — I2510 Atherosclerotic heart disease of native coronary artery without angina pectoris: Secondary | ICD-10-CM | POA: Diagnosis not present

## 2019-03-15 DIAGNOSIS — Z87440 Personal history of urinary (tract) infections: Secondary | ICD-10-CM | POA: Diagnosis not present

## 2019-03-15 DIAGNOSIS — Z466 Encounter for fitting and adjustment of urinary device: Secondary | ICD-10-CM | POA: Diagnosis not present

## 2019-03-15 DIAGNOSIS — R1310 Dysphagia, unspecified: Secondary | ICD-10-CM | POA: Diagnosis not present

## 2019-03-15 DIAGNOSIS — N139 Obstructive and reflux uropathy, unspecified: Secondary | ICD-10-CM | POA: Diagnosis not present

## 2019-03-15 DIAGNOSIS — N184 Chronic kidney disease, stage 4 (severe): Secondary | ICD-10-CM | POA: Diagnosis not present

## 2019-03-15 DIAGNOSIS — M549 Dorsalgia, unspecified: Secondary | ICD-10-CM | POA: Diagnosis not present

## 2019-03-15 DIAGNOSIS — Z9181 History of falling: Secondary | ICD-10-CM | POA: Diagnosis not present

## 2019-03-15 DIAGNOSIS — M1990 Unspecified osteoarthritis, unspecified site: Secondary | ICD-10-CM | POA: Diagnosis not present

## 2019-03-15 DIAGNOSIS — I252 Old myocardial infarction: Secondary | ICD-10-CM | POA: Diagnosis not present

## 2019-03-15 DIAGNOSIS — I69391 Dysphagia following cerebral infarction: Secondary | ICD-10-CM | POA: Diagnosis not present

## 2019-03-15 DIAGNOSIS — J439 Emphysema, unspecified: Secondary | ICD-10-CM | POA: Diagnosis not present

## 2019-03-15 DIAGNOSIS — G4733 Obstructive sleep apnea (adult) (pediatric): Secondary | ICD-10-CM | POA: Diagnosis not present

## 2019-03-15 DIAGNOSIS — E43 Unspecified severe protein-calorie malnutrition: Secondary | ICD-10-CM | POA: Diagnosis not present

## 2019-03-15 DIAGNOSIS — I130 Hypertensive heart and chronic kidney disease with heart failure and stage 1 through stage 4 chronic kidney disease, or unspecified chronic kidney disease: Secondary | ICD-10-CM | POA: Diagnosis not present

## 2019-03-21 DIAGNOSIS — I69351 Hemiplegia and hemiparesis following cerebral infarction affecting right dominant side: Secondary | ICD-10-CM | POA: Diagnosis not present

## 2019-03-21 DIAGNOSIS — M24531 Contracture, right wrist: Secondary | ICD-10-CM | POA: Diagnosis not present

## 2019-03-21 DIAGNOSIS — M24541 Contracture, right hand: Secondary | ICD-10-CM | POA: Diagnosis not present

## 2019-03-21 DIAGNOSIS — M24521 Contracture, right elbow: Secondary | ICD-10-CM | POA: Diagnosis not present

## 2019-03-22 DIAGNOSIS — Z9181 History of falling: Secondary | ICD-10-CM | POA: Diagnosis not present

## 2019-03-22 DIAGNOSIS — M549 Dorsalgia, unspecified: Secondary | ICD-10-CM | POA: Diagnosis not present

## 2019-03-22 DIAGNOSIS — N139 Obstructive and reflux uropathy, unspecified: Secondary | ICD-10-CM | POA: Diagnosis not present

## 2019-03-22 DIAGNOSIS — I69320 Aphasia following cerebral infarction: Secondary | ICD-10-CM | POA: Diagnosis not present

## 2019-03-22 DIAGNOSIS — G8929 Other chronic pain: Secondary | ICD-10-CM | POA: Diagnosis not present

## 2019-03-22 DIAGNOSIS — N184 Chronic kidney disease, stage 4 (severe): Secondary | ICD-10-CM | POA: Diagnosis not present

## 2019-03-22 DIAGNOSIS — Z87440 Personal history of urinary (tract) infections: Secondary | ICD-10-CM | POA: Diagnosis not present

## 2019-03-22 DIAGNOSIS — I130 Hypertensive heart and chronic kidney disease with heart failure and stage 1 through stage 4 chronic kidney disease, or unspecified chronic kidney disease: Secondary | ICD-10-CM | POA: Diagnosis not present

## 2019-03-22 DIAGNOSIS — I5032 Chronic diastolic (congestive) heart failure: Secondary | ICD-10-CM | POA: Diagnosis not present

## 2019-03-22 DIAGNOSIS — E7800 Pure hypercholesterolemia, unspecified: Secondary | ICD-10-CM | POA: Diagnosis not present

## 2019-03-22 DIAGNOSIS — I2510 Atherosclerotic heart disease of native coronary artery without angina pectoris: Secondary | ICD-10-CM | POA: Diagnosis not present

## 2019-03-22 DIAGNOSIS — M419 Scoliosis, unspecified: Secondary | ICD-10-CM | POA: Diagnosis not present

## 2019-03-22 DIAGNOSIS — E1122 Type 2 diabetes mellitus with diabetic chronic kidney disease: Secondary | ICD-10-CM | POA: Diagnosis not present

## 2019-03-22 DIAGNOSIS — M1990 Unspecified osteoarthritis, unspecified site: Secondary | ICD-10-CM | POA: Diagnosis not present

## 2019-03-22 DIAGNOSIS — G4733 Obstructive sleep apnea (adult) (pediatric): Secondary | ICD-10-CM | POA: Diagnosis not present

## 2019-03-22 DIAGNOSIS — Z993 Dependence on wheelchair: Secondary | ICD-10-CM | POA: Diagnosis not present

## 2019-03-22 DIAGNOSIS — Z466 Encounter for fitting and adjustment of urinary device: Secondary | ICD-10-CM | POA: Diagnosis not present

## 2019-03-22 DIAGNOSIS — Z79891 Long term (current) use of opiate analgesic: Secondary | ICD-10-CM | POA: Diagnosis not present

## 2019-03-22 DIAGNOSIS — E43 Unspecified severe protein-calorie malnutrition: Secondary | ICD-10-CM | POA: Diagnosis not present

## 2019-03-22 DIAGNOSIS — R1310 Dysphagia, unspecified: Secondary | ICD-10-CM | POA: Diagnosis not present

## 2019-03-22 DIAGNOSIS — D631 Anemia in chronic kidney disease: Secondary | ICD-10-CM | POA: Diagnosis not present

## 2019-03-22 DIAGNOSIS — I69391 Dysphagia following cerebral infarction: Secondary | ICD-10-CM | POA: Diagnosis not present

## 2019-03-22 DIAGNOSIS — I69351 Hemiplegia and hemiparesis following cerebral infarction affecting right dominant side: Secondary | ICD-10-CM | POA: Diagnosis not present

## 2019-03-22 DIAGNOSIS — I252 Old myocardial infarction: Secondary | ICD-10-CM | POA: Diagnosis not present

## 2019-03-22 DIAGNOSIS — J439 Emphysema, unspecified: Secondary | ICD-10-CM | POA: Diagnosis not present

## 2019-03-25 DIAGNOSIS — J449 Chronic obstructive pulmonary disease, unspecified: Secondary | ICD-10-CM | POA: Diagnosis not present

## 2019-03-27 DIAGNOSIS — I639 Cerebral infarction, unspecified: Secondary | ICD-10-CM | POA: Diagnosis not present

## 2019-03-27 DIAGNOSIS — G8111 Spastic hemiplegia affecting right dominant side: Secondary | ICD-10-CM | POA: Diagnosis not present

## 2019-03-27 DIAGNOSIS — M150 Primary generalized (osteo)arthritis: Secondary | ICD-10-CM | POA: Diagnosis not present

## 2019-03-30 DIAGNOSIS — G8929 Other chronic pain: Secondary | ICD-10-CM | POA: Diagnosis not present

## 2019-03-30 DIAGNOSIS — G894 Chronic pain syndrome: Secondary | ICD-10-CM | POA: Diagnosis not present

## 2019-03-30 DIAGNOSIS — M545 Low back pain: Secondary | ICD-10-CM | POA: Diagnosis not present

## 2019-03-30 DIAGNOSIS — M79642 Pain in left hand: Secondary | ICD-10-CM | POA: Diagnosis not present

## 2019-03-30 DIAGNOSIS — I6930 Unspecified sequelae of cerebral infarction: Secondary | ICD-10-CM | POA: Diagnosis not present

## 2019-03-30 DIAGNOSIS — Z79891 Long term (current) use of opiate analgesic: Secondary | ICD-10-CM | POA: Diagnosis not present

## 2019-03-30 DIAGNOSIS — M79641 Pain in right hand: Secondary | ICD-10-CM | POA: Diagnosis not present

## 2019-03-30 DIAGNOSIS — M79671 Pain in right foot: Secondary | ICD-10-CM | POA: Diagnosis not present

## 2019-04-06 DIAGNOSIS — Z87440 Personal history of urinary (tract) infections: Secondary | ICD-10-CM | POA: Diagnosis not present

## 2019-04-06 DIAGNOSIS — Z9181 History of falling: Secondary | ICD-10-CM | POA: Diagnosis not present

## 2019-04-06 DIAGNOSIS — J439 Emphysema, unspecified: Secondary | ICD-10-CM | POA: Diagnosis not present

## 2019-04-06 DIAGNOSIS — Z79891 Long term (current) use of opiate analgesic: Secondary | ICD-10-CM | POA: Diagnosis not present

## 2019-04-06 DIAGNOSIS — I130 Hypertensive heart and chronic kidney disease with heart failure and stage 1 through stage 4 chronic kidney disease, or unspecified chronic kidney disease: Secondary | ICD-10-CM | POA: Diagnosis not present

## 2019-04-06 DIAGNOSIS — M419 Scoliosis, unspecified: Secondary | ICD-10-CM | POA: Diagnosis not present

## 2019-04-06 DIAGNOSIS — G4733 Obstructive sleep apnea (adult) (pediatric): Secondary | ICD-10-CM | POA: Diagnosis not present

## 2019-04-06 DIAGNOSIS — Z466 Encounter for fitting and adjustment of urinary device: Secondary | ICD-10-CM | POA: Diagnosis not present

## 2019-04-06 DIAGNOSIS — E43 Unspecified severe protein-calorie malnutrition: Secondary | ICD-10-CM | POA: Diagnosis not present

## 2019-04-06 DIAGNOSIS — N139 Obstructive and reflux uropathy, unspecified: Secondary | ICD-10-CM | POA: Diagnosis not present

## 2019-04-06 DIAGNOSIS — N184 Chronic kidney disease, stage 4 (severe): Secondary | ICD-10-CM | POA: Diagnosis not present

## 2019-04-06 DIAGNOSIS — M549 Dorsalgia, unspecified: Secondary | ICD-10-CM | POA: Diagnosis not present

## 2019-04-06 DIAGNOSIS — M1990 Unspecified osteoarthritis, unspecified site: Secondary | ICD-10-CM | POA: Diagnosis not present

## 2019-04-06 DIAGNOSIS — I69320 Aphasia following cerebral infarction: Secondary | ICD-10-CM | POA: Diagnosis not present

## 2019-04-06 DIAGNOSIS — I69351 Hemiplegia and hemiparesis following cerebral infarction affecting right dominant side: Secondary | ICD-10-CM | POA: Diagnosis not present

## 2019-04-06 DIAGNOSIS — G8929 Other chronic pain: Secondary | ICD-10-CM | POA: Diagnosis not present

## 2019-04-06 DIAGNOSIS — R1310 Dysphagia, unspecified: Secondary | ICD-10-CM | POA: Diagnosis not present

## 2019-04-06 DIAGNOSIS — E1122 Type 2 diabetes mellitus with diabetic chronic kidney disease: Secondary | ICD-10-CM | POA: Diagnosis not present

## 2019-04-06 DIAGNOSIS — I5032 Chronic diastolic (congestive) heart failure: Secondary | ICD-10-CM | POA: Diagnosis not present

## 2019-04-06 DIAGNOSIS — D631 Anemia in chronic kidney disease: Secondary | ICD-10-CM | POA: Diagnosis not present

## 2019-04-06 DIAGNOSIS — E7800 Pure hypercholesterolemia, unspecified: Secondary | ICD-10-CM | POA: Diagnosis not present

## 2019-04-06 DIAGNOSIS — I252 Old myocardial infarction: Secondary | ICD-10-CM | POA: Diagnosis not present

## 2019-04-06 DIAGNOSIS — I69391 Dysphagia following cerebral infarction: Secondary | ICD-10-CM | POA: Diagnosis not present

## 2019-04-06 DIAGNOSIS — Z993 Dependence on wheelchair: Secondary | ICD-10-CM | POA: Diagnosis not present

## 2019-04-06 DIAGNOSIS — I2510 Atherosclerotic heart disease of native coronary artery without angina pectoris: Secondary | ICD-10-CM | POA: Diagnosis not present

## 2019-04-09 DIAGNOSIS — R339 Retention of urine, unspecified: Secondary | ICD-10-CM | POA: Diagnosis not present

## 2019-04-09 DIAGNOSIS — N319 Neuromuscular dysfunction of bladder, unspecified: Secondary | ICD-10-CM | POA: Diagnosis not present

## 2019-04-09 DIAGNOSIS — Z466 Encounter for fitting and adjustment of urinary device: Secondary | ICD-10-CM | POA: Diagnosis not present

## 2019-04-11 DIAGNOSIS — J439 Emphysema, unspecified: Secondary | ICD-10-CM | POA: Diagnosis not present

## 2019-04-11 DIAGNOSIS — I69351 Hemiplegia and hemiparesis following cerebral infarction affecting right dominant side: Secondary | ICD-10-CM | POA: Diagnosis not present

## 2019-04-11 DIAGNOSIS — G8929 Other chronic pain: Secondary | ICD-10-CM | POA: Diagnosis not present

## 2019-04-11 DIAGNOSIS — E7800 Pure hypercholesterolemia, unspecified: Secondary | ICD-10-CM | POA: Diagnosis not present

## 2019-04-11 DIAGNOSIS — Z466 Encounter for fitting and adjustment of urinary device: Secondary | ICD-10-CM | POA: Diagnosis not present

## 2019-04-11 DIAGNOSIS — I2510 Atherosclerotic heart disease of native coronary artery without angina pectoris: Secondary | ICD-10-CM | POA: Diagnosis not present

## 2019-04-11 DIAGNOSIS — I130 Hypertensive heart and chronic kidney disease with heart failure and stage 1 through stage 4 chronic kidney disease, or unspecified chronic kidney disease: Secondary | ICD-10-CM | POA: Diagnosis not present

## 2019-04-11 DIAGNOSIS — N184 Chronic kidney disease, stage 4 (severe): Secondary | ICD-10-CM | POA: Diagnosis not present

## 2019-04-11 DIAGNOSIS — I5032 Chronic diastolic (congestive) heart failure: Secondary | ICD-10-CM | POA: Diagnosis not present

## 2019-04-11 DIAGNOSIS — M1990 Unspecified osteoarthritis, unspecified site: Secondary | ICD-10-CM | POA: Diagnosis not present

## 2019-04-11 DIAGNOSIS — M419 Scoliosis, unspecified: Secondary | ICD-10-CM | POA: Diagnosis not present

## 2019-04-11 DIAGNOSIS — I69391 Dysphagia following cerebral infarction: Secondary | ICD-10-CM | POA: Diagnosis not present

## 2019-04-11 DIAGNOSIS — R1310 Dysphagia, unspecified: Secondary | ICD-10-CM | POA: Diagnosis not present

## 2019-04-11 DIAGNOSIS — E1122 Type 2 diabetes mellitus with diabetic chronic kidney disease: Secondary | ICD-10-CM | POA: Diagnosis not present

## 2019-04-11 DIAGNOSIS — D631 Anemia in chronic kidney disease: Secondary | ICD-10-CM | POA: Diagnosis not present

## 2019-04-11 DIAGNOSIS — Z87440 Personal history of urinary (tract) infections: Secondary | ICD-10-CM | POA: Diagnosis not present

## 2019-04-11 DIAGNOSIS — E43 Unspecified severe protein-calorie malnutrition: Secondary | ICD-10-CM | POA: Diagnosis not present

## 2019-04-11 DIAGNOSIS — I69320 Aphasia following cerebral infarction: Secondary | ICD-10-CM | POA: Diagnosis not present

## 2019-04-11 DIAGNOSIS — G4733 Obstructive sleep apnea (adult) (pediatric): Secondary | ICD-10-CM | POA: Diagnosis not present

## 2019-04-11 DIAGNOSIS — I252 Old myocardial infarction: Secondary | ICD-10-CM | POA: Diagnosis not present

## 2019-04-11 DIAGNOSIS — M549 Dorsalgia, unspecified: Secondary | ICD-10-CM | POA: Diagnosis not present

## 2019-04-11 DIAGNOSIS — Z993 Dependence on wheelchair: Secondary | ICD-10-CM | POA: Diagnosis not present

## 2019-04-11 DIAGNOSIS — Z9181 History of falling: Secondary | ICD-10-CM | POA: Diagnosis not present

## 2019-04-11 DIAGNOSIS — N139 Obstructive and reflux uropathy, unspecified: Secondary | ICD-10-CM | POA: Diagnosis not present

## 2019-04-11 DIAGNOSIS — Z79891 Long term (current) use of opiate analgesic: Secondary | ICD-10-CM | POA: Diagnosis not present

## 2019-04-13 DIAGNOSIS — L89312 Pressure ulcer of right buttock, stage 2: Secondary | ICD-10-CM | POA: Diagnosis not present

## 2019-04-13 DIAGNOSIS — L89620 Pressure ulcer of left heel, unstageable: Secondary | ICD-10-CM | POA: Diagnosis not present

## 2019-04-14 DIAGNOSIS — N319 Neuromuscular dysfunction of bladder, unspecified: Secondary | ICD-10-CM | POA: Diagnosis not present

## 2019-04-14 DIAGNOSIS — R339 Retention of urine, unspecified: Secondary | ICD-10-CM | POA: Diagnosis not present

## 2019-04-14 DIAGNOSIS — Z466 Encounter for fitting and adjustment of urinary device: Secondary | ICD-10-CM | POA: Diagnosis not present

## 2019-04-18 DIAGNOSIS — J439 Emphysema, unspecified: Secondary | ICD-10-CM | POA: Diagnosis not present

## 2019-04-18 DIAGNOSIS — N184 Chronic kidney disease, stage 4 (severe): Secondary | ICD-10-CM | POA: Diagnosis not present

## 2019-04-18 DIAGNOSIS — I69391 Dysphagia following cerebral infarction: Secondary | ICD-10-CM | POA: Diagnosis not present

## 2019-04-18 DIAGNOSIS — G4733 Obstructive sleep apnea (adult) (pediatric): Secondary | ICD-10-CM | POA: Diagnosis not present

## 2019-04-18 DIAGNOSIS — E7800 Pure hypercholesterolemia, unspecified: Secondary | ICD-10-CM | POA: Diagnosis not present

## 2019-04-18 DIAGNOSIS — I5032 Chronic diastolic (congestive) heart failure: Secondary | ICD-10-CM | POA: Diagnosis not present

## 2019-04-18 DIAGNOSIS — Z87440 Personal history of urinary (tract) infections: Secondary | ICD-10-CM | POA: Diagnosis not present

## 2019-04-18 DIAGNOSIS — E1122 Type 2 diabetes mellitus with diabetic chronic kidney disease: Secondary | ICD-10-CM | POA: Diagnosis not present

## 2019-04-18 DIAGNOSIS — G8929 Other chronic pain: Secondary | ICD-10-CM | POA: Diagnosis not present

## 2019-04-18 DIAGNOSIS — I69351 Hemiplegia and hemiparesis following cerebral infarction affecting right dominant side: Secondary | ICD-10-CM | POA: Diagnosis not present

## 2019-04-18 DIAGNOSIS — Z9181 History of falling: Secondary | ICD-10-CM | POA: Diagnosis not present

## 2019-04-18 DIAGNOSIS — I252 Old myocardial infarction: Secondary | ICD-10-CM | POA: Diagnosis not present

## 2019-04-18 DIAGNOSIS — I2510 Atherosclerotic heart disease of native coronary artery without angina pectoris: Secondary | ICD-10-CM | POA: Diagnosis not present

## 2019-04-18 DIAGNOSIS — Z466 Encounter for fitting and adjustment of urinary device: Secondary | ICD-10-CM | POA: Diagnosis not present

## 2019-04-18 DIAGNOSIS — I130 Hypertensive heart and chronic kidney disease with heart failure and stage 1 through stage 4 chronic kidney disease, or unspecified chronic kidney disease: Secondary | ICD-10-CM | POA: Diagnosis not present

## 2019-04-18 DIAGNOSIS — M419 Scoliosis, unspecified: Secondary | ICD-10-CM | POA: Diagnosis not present

## 2019-04-18 DIAGNOSIS — E43 Unspecified severe protein-calorie malnutrition: Secondary | ICD-10-CM | POA: Diagnosis not present

## 2019-04-18 DIAGNOSIS — Z993 Dependence on wheelchair: Secondary | ICD-10-CM | POA: Diagnosis not present

## 2019-04-18 DIAGNOSIS — D631 Anemia in chronic kidney disease: Secondary | ICD-10-CM | POA: Diagnosis not present

## 2019-04-18 DIAGNOSIS — M549 Dorsalgia, unspecified: Secondary | ICD-10-CM | POA: Diagnosis not present

## 2019-04-18 DIAGNOSIS — N139 Obstructive and reflux uropathy, unspecified: Secondary | ICD-10-CM | POA: Diagnosis not present

## 2019-04-18 DIAGNOSIS — M1990 Unspecified osteoarthritis, unspecified site: Secondary | ICD-10-CM | POA: Diagnosis not present

## 2019-04-18 DIAGNOSIS — Z79891 Long term (current) use of opiate analgesic: Secondary | ICD-10-CM | POA: Diagnosis not present

## 2019-04-18 DIAGNOSIS — I69320 Aphasia following cerebral infarction: Secondary | ICD-10-CM | POA: Diagnosis not present

## 2019-04-18 DIAGNOSIS — R1310 Dysphagia, unspecified: Secondary | ICD-10-CM | POA: Diagnosis not present

## 2019-04-25 DIAGNOSIS — N184 Chronic kidney disease, stage 4 (severe): Secondary | ICD-10-CM | POA: Diagnosis not present

## 2019-04-25 DIAGNOSIS — I69351 Hemiplegia and hemiparesis following cerebral infarction affecting right dominant side: Secondary | ICD-10-CM | POA: Diagnosis not present

## 2019-04-25 DIAGNOSIS — G8929 Other chronic pain: Secondary | ICD-10-CM | POA: Diagnosis not present

## 2019-04-25 DIAGNOSIS — Z466 Encounter for fitting and adjustment of urinary device: Secondary | ICD-10-CM | POA: Diagnosis not present

## 2019-04-25 DIAGNOSIS — N139 Obstructive and reflux uropathy, unspecified: Secondary | ICD-10-CM | POA: Diagnosis not present

## 2019-04-25 DIAGNOSIS — M1990 Unspecified osteoarthritis, unspecified site: Secondary | ICD-10-CM | POA: Diagnosis not present

## 2019-04-25 DIAGNOSIS — M419 Scoliosis, unspecified: Secondary | ICD-10-CM | POA: Diagnosis not present

## 2019-04-25 DIAGNOSIS — E1122 Type 2 diabetes mellitus with diabetic chronic kidney disease: Secondary | ICD-10-CM | POA: Diagnosis not present

## 2019-04-25 DIAGNOSIS — Z87440 Personal history of urinary (tract) infections: Secondary | ICD-10-CM | POA: Diagnosis not present

## 2019-04-25 DIAGNOSIS — I5032 Chronic diastolic (congestive) heart failure: Secondary | ICD-10-CM | POA: Diagnosis not present

## 2019-04-25 DIAGNOSIS — I2510 Atherosclerotic heart disease of native coronary artery without angina pectoris: Secondary | ICD-10-CM | POA: Diagnosis not present

## 2019-04-25 DIAGNOSIS — I69320 Aphasia following cerebral infarction: Secondary | ICD-10-CM | POA: Diagnosis not present

## 2019-04-25 DIAGNOSIS — J439 Emphysema, unspecified: Secondary | ICD-10-CM | POA: Diagnosis not present

## 2019-04-25 DIAGNOSIS — G4733 Obstructive sleep apnea (adult) (pediatric): Secondary | ICD-10-CM | POA: Diagnosis not present

## 2019-04-25 DIAGNOSIS — D631 Anemia in chronic kidney disease: Secondary | ICD-10-CM | POA: Diagnosis not present

## 2019-04-25 DIAGNOSIS — I69391 Dysphagia following cerebral infarction: Secondary | ICD-10-CM | POA: Diagnosis not present

## 2019-04-25 DIAGNOSIS — I252 Old myocardial infarction: Secondary | ICD-10-CM | POA: Diagnosis not present

## 2019-04-25 DIAGNOSIS — L89312 Pressure ulcer of right buttock, stage 2: Secondary | ICD-10-CM | POA: Diagnosis not present

## 2019-04-25 DIAGNOSIS — E43 Unspecified severe protein-calorie malnutrition: Secondary | ICD-10-CM | POA: Diagnosis not present

## 2019-04-25 DIAGNOSIS — Z7401 Bed confinement status: Secondary | ICD-10-CM | POA: Diagnosis not present

## 2019-04-25 DIAGNOSIS — Z79899 Other long term (current) drug therapy: Secondary | ICD-10-CM | POA: Diagnosis not present

## 2019-04-25 DIAGNOSIS — I130 Hypertensive heart and chronic kidney disease with heart failure and stage 1 through stage 4 chronic kidney disease, or unspecified chronic kidney disease: Secondary | ICD-10-CM | POA: Diagnosis not present

## 2019-04-25 DIAGNOSIS — R1310 Dysphagia, unspecified: Secondary | ICD-10-CM | POA: Diagnosis not present

## 2019-04-25 DIAGNOSIS — M549 Dorsalgia, unspecified: Secondary | ICD-10-CM | POA: Diagnosis not present

## 2019-04-25 DIAGNOSIS — E7800 Pure hypercholesterolemia, unspecified: Secondary | ICD-10-CM | POA: Diagnosis not present

## 2019-04-27 DIAGNOSIS — R339 Retention of urine, unspecified: Secondary | ICD-10-CM | POA: Diagnosis not present

## 2019-04-27 DIAGNOSIS — G8111 Spastic hemiplegia affecting right dominant side: Secondary | ICD-10-CM | POA: Diagnosis not present

## 2019-04-27 DIAGNOSIS — Z466 Encounter for fitting and adjustment of urinary device: Secondary | ICD-10-CM | POA: Diagnosis not present

## 2019-04-27 DIAGNOSIS — E119 Type 2 diabetes mellitus without complications: Secondary | ICD-10-CM | POA: Diagnosis not present

## 2019-04-27 DIAGNOSIS — I639 Cerebral infarction, unspecified: Secondary | ICD-10-CM | POA: Diagnosis not present

## 2019-04-27 DIAGNOSIS — N319 Neuromuscular dysfunction of bladder, unspecified: Secondary | ICD-10-CM | POA: Diagnosis not present

## 2019-05-03 DIAGNOSIS — I69320 Aphasia following cerebral infarction: Secondary | ICD-10-CM | POA: Diagnosis not present

## 2019-05-03 DIAGNOSIS — L89312 Pressure ulcer of right buttock, stage 2: Secondary | ICD-10-CM | POA: Diagnosis not present

## 2019-05-03 DIAGNOSIS — I252 Old myocardial infarction: Secondary | ICD-10-CM | POA: Diagnosis not present

## 2019-05-03 DIAGNOSIS — I69391 Dysphagia following cerebral infarction: Secondary | ICD-10-CM | POA: Diagnosis not present

## 2019-05-03 DIAGNOSIS — J439 Emphysema, unspecified: Secondary | ICD-10-CM | POA: Diagnosis not present

## 2019-05-03 DIAGNOSIS — I130 Hypertensive heart and chronic kidney disease with heart failure and stage 1 through stage 4 chronic kidney disease, or unspecified chronic kidney disease: Secondary | ICD-10-CM | POA: Diagnosis not present

## 2019-05-03 DIAGNOSIS — G8929 Other chronic pain: Secondary | ICD-10-CM | POA: Diagnosis not present

## 2019-05-03 DIAGNOSIS — I69351 Hemiplegia and hemiparesis following cerebral infarction affecting right dominant side: Secondary | ICD-10-CM | POA: Diagnosis not present

## 2019-05-03 DIAGNOSIS — M1990 Unspecified osteoarthritis, unspecified site: Secondary | ICD-10-CM | POA: Diagnosis not present

## 2019-05-03 DIAGNOSIS — M419 Scoliosis, unspecified: Secondary | ICD-10-CM | POA: Diagnosis not present

## 2019-05-03 DIAGNOSIS — I2510 Atherosclerotic heart disease of native coronary artery without angina pectoris: Secondary | ICD-10-CM | POA: Diagnosis not present

## 2019-05-03 DIAGNOSIS — Z79899 Other long term (current) drug therapy: Secondary | ICD-10-CM | POA: Diagnosis not present

## 2019-05-03 DIAGNOSIS — G4733 Obstructive sleep apnea (adult) (pediatric): Secondary | ICD-10-CM | POA: Diagnosis not present

## 2019-05-03 DIAGNOSIS — E7800 Pure hypercholesterolemia, unspecified: Secondary | ICD-10-CM | POA: Diagnosis not present

## 2019-05-03 DIAGNOSIS — R8290 Unspecified abnormal findings in urine: Secondary | ICD-10-CM | POA: Diagnosis not present

## 2019-05-03 DIAGNOSIS — I5032 Chronic diastolic (congestive) heart failure: Secondary | ICD-10-CM | POA: Diagnosis not present

## 2019-05-03 DIAGNOSIS — N184 Chronic kidney disease, stage 4 (severe): Secondary | ICD-10-CM | POA: Diagnosis not present

## 2019-05-03 DIAGNOSIS — Z466 Encounter for fitting and adjustment of urinary device: Secondary | ICD-10-CM | POA: Diagnosis not present

## 2019-05-03 DIAGNOSIS — E1122 Type 2 diabetes mellitus with diabetic chronic kidney disease: Secondary | ICD-10-CM | POA: Diagnosis not present

## 2019-05-03 DIAGNOSIS — M549 Dorsalgia, unspecified: Secondary | ICD-10-CM | POA: Diagnosis not present

## 2019-05-03 DIAGNOSIS — N139 Obstructive and reflux uropathy, unspecified: Secondary | ICD-10-CM | POA: Diagnosis not present

## 2019-05-03 DIAGNOSIS — Z87440 Personal history of urinary (tract) infections: Secondary | ICD-10-CM | POA: Diagnosis not present

## 2019-05-03 DIAGNOSIS — D631 Anemia in chronic kidney disease: Secondary | ICD-10-CM | POA: Diagnosis not present

## 2019-05-03 DIAGNOSIS — E43 Unspecified severe protein-calorie malnutrition: Secondary | ICD-10-CM | POA: Diagnosis not present

## 2019-05-03 DIAGNOSIS — Z7401 Bed confinement status: Secondary | ICD-10-CM | POA: Diagnosis not present

## 2019-05-03 DIAGNOSIS — R1310 Dysphagia, unspecified: Secondary | ICD-10-CM | POA: Diagnosis not present

## 2019-05-09 DIAGNOSIS — Z466 Encounter for fitting and adjustment of urinary device: Secondary | ICD-10-CM | POA: Diagnosis not present

## 2019-05-09 DIAGNOSIS — E1122 Type 2 diabetes mellitus with diabetic chronic kidney disease: Secondary | ICD-10-CM | POA: Diagnosis not present

## 2019-05-09 DIAGNOSIS — D631 Anemia in chronic kidney disease: Secondary | ICD-10-CM | POA: Diagnosis not present

## 2019-05-09 DIAGNOSIS — I69351 Hemiplegia and hemiparesis following cerebral infarction affecting right dominant side: Secondary | ICD-10-CM | POA: Diagnosis not present

## 2019-05-09 DIAGNOSIS — G4733 Obstructive sleep apnea (adult) (pediatric): Secondary | ICD-10-CM | POA: Diagnosis not present

## 2019-05-09 DIAGNOSIS — M549 Dorsalgia, unspecified: Secondary | ICD-10-CM | POA: Diagnosis not present

## 2019-05-09 DIAGNOSIS — Z7401 Bed confinement status: Secondary | ICD-10-CM | POA: Diagnosis not present

## 2019-05-09 DIAGNOSIS — I69320 Aphasia following cerebral infarction: Secondary | ICD-10-CM | POA: Diagnosis not present

## 2019-05-09 DIAGNOSIS — I252 Old myocardial infarction: Secondary | ICD-10-CM | POA: Diagnosis not present

## 2019-05-09 DIAGNOSIS — I2510 Atherosclerotic heart disease of native coronary artery without angina pectoris: Secondary | ICD-10-CM | POA: Diagnosis not present

## 2019-05-09 DIAGNOSIS — Z79899 Other long term (current) drug therapy: Secondary | ICD-10-CM | POA: Diagnosis not present

## 2019-05-09 DIAGNOSIS — L89312 Pressure ulcer of right buttock, stage 2: Secondary | ICD-10-CM | POA: Diagnosis not present

## 2019-05-09 DIAGNOSIS — G8929 Other chronic pain: Secondary | ICD-10-CM | POA: Diagnosis not present

## 2019-05-09 DIAGNOSIS — Z87440 Personal history of urinary (tract) infections: Secondary | ICD-10-CM | POA: Diagnosis not present

## 2019-05-09 DIAGNOSIS — M1990 Unspecified osteoarthritis, unspecified site: Secondary | ICD-10-CM | POA: Diagnosis not present

## 2019-05-09 DIAGNOSIS — R1310 Dysphagia, unspecified: Secondary | ICD-10-CM | POA: Diagnosis not present

## 2019-05-09 DIAGNOSIS — I5032 Chronic diastolic (congestive) heart failure: Secondary | ICD-10-CM | POA: Diagnosis not present

## 2019-05-09 DIAGNOSIS — N184 Chronic kidney disease, stage 4 (severe): Secondary | ICD-10-CM | POA: Diagnosis not present

## 2019-05-09 DIAGNOSIS — J439 Emphysema, unspecified: Secondary | ICD-10-CM | POA: Diagnosis not present

## 2019-05-09 DIAGNOSIS — E7800 Pure hypercholesterolemia, unspecified: Secondary | ICD-10-CM | POA: Diagnosis not present

## 2019-05-09 DIAGNOSIS — E43 Unspecified severe protein-calorie malnutrition: Secondary | ICD-10-CM | POA: Diagnosis not present

## 2019-05-09 DIAGNOSIS — I130 Hypertensive heart and chronic kidney disease with heart failure and stage 1 through stage 4 chronic kidney disease, or unspecified chronic kidney disease: Secondary | ICD-10-CM | POA: Diagnosis not present

## 2019-05-09 DIAGNOSIS — N139 Obstructive and reflux uropathy, unspecified: Secondary | ICD-10-CM | POA: Diagnosis not present

## 2019-05-09 DIAGNOSIS — I69391 Dysphagia following cerebral infarction: Secondary | ICD-10-CM | POA: Diagnosis not present

## 2019-05-09 DIAGNOSIS — M419 Scoliosis, unspecified: Secondary | ICD-10-CM | POA: Diagnosis not present

## 2019-05-11 ENCOUNTER — Telehealth: Payer: Self-pay | Admitting: Internal Medicine

## 2019-05-11 NOTE — Telephone Encounter (Addendum)
Returned call to [place] at [employer] [facility]). She is currently in a training. Left message on VM requesting return call. Hubbard Hartshorn, BSN, RN-BC

## 2019-05-11 NOTE — Telephone Encounter (Signed)
Pls contact [place] regarding oxygen order [phone]

## 2019-05-11 NOTE — Telephone Encounter (Signed)
Regina returned call. Needs order for Ensure faxed to her. Signed order was in PCP's box. This has been faxed to [facility] at [phone] with fax confirmation receipt received. Hubbard Hartshorn, BSN, RN-BC

## 2019-05-18 DIAGNOSIS — E1122 Type 2 diabetes mellitus with diabetic chronic kidney disease: Secondary | ICD-10-CM | POA: Diagnosis not present

## 2019-05-18 DIAGNOSIS — I5032 Chronic diastolic (congestive) heart failure: Secondary | ICD-10-CM | POA: Diagnosis not present

## 2019-05-18 DIAGNOSIS — I2510 Atherosclerotic heart disease of native coronary artery without angina pectoris: Secondary | ICD-10-CM | POA: Diagnosis not present

## 2019-05-18 DIAGNOSIS — M1990 Unspecified osteoarthritis, unspecified site: Secondary | ICD-10-CM | POA: Diagnosis not present

## 2019-05-18 DIAGNOSIS — N139 Obstructive and reflux uropathy, unspecified: Secondary | ICD-10-CM | POA: Diagnosis not present

## 2019-05-18 DIAGNOSIS — D631 Anemia in chronic kidney disease: Secondary | ICD-10-CM | POA: Diagnosis not present

## 2019-05-18 DIAGNOSIS — L89312 Pressure ulcer of right buttock, stage 2: Secondary | ICD-10-CM | POA: Diagnosis not present

## 2019-05-18 DIAGNOSIS — M419 Scoliosis, unspecified: Secondary | ICD-10-CM | POA: Diagnosis not present

## 2019-05-18 DIAGNOSIS — I69391 Dysphagia following cerebral infarction: Secondary | ICD-10-CM | POA: Diagnosis not present

## 2019-05-18 DIAGNOSIS — E43 Unspecified severe protein-calorie malnutrition: Secondary | ICD-10-CM | POA: Diagnosis not present

## 2019-05-18 DIAGNOSIS — M549 Dorsalgia, unspecified: Secondary | ICD-10-CM | POA: Diagnosis not present

## 2019-05-18 DIAGNOSIS — I130 Hypertensive heart and chronic kidney disease with heart failure and stage 1 through stage 4 chronic kidney disease, or unspecified chronic kidney disease: Secondary | ICD-10-CM | POA: Diagnosis not present

## 2019-05-18 DIAGNOSIS — I252 Old myocardial infarction: Secondary | ICD-10-CM | POA: Diagnosis not present

## 2019-05-18 DIAGNOSIS — Z87440 Personal history of urinary (tract) infections: Secondary | ICD-10-CM | POA: Diagnosis not present

## 2019-05-18 DIAGNOSIS — I69320 Aphasia following cerebral infarction: Secondary | ICD-10-CM | POA: Diagnosis not present

## 2019-05-18 DIAGNOSIS — J439 Emphysema, unspecified: Secondary | ICD-10-CM | POA: Diagnosis not present

## 2019-05-18 DIAGNOSIS — G4733 Obstructive sleep apnea (adult) (pediatric): Secondary | ICD-10-CM | POA: Diagnosis not present

## 2019-05-18 DIAGNOSIS — Z7401 Bed confinement status: Secondary | ICD-10-CM | POA: Diagnosis not present

## 2019-05-18 DIAGNOSIS — G8929 Other chronic pain: Secondary | ICD-10-CM | POA: Diagnosis not present

## 2019-05-18 DIAGNOSIS — R1310 Dysphagia, unspecified: Secondary | ICD-10-CM | POA: Diagnosis not present

## 2019-05-18 DIAGNOSIS — Z466 Encounter for fitting and adjustment of urinary device: Secondary | ICD-10-CM | POA: Diagnosis not present

## 2019-05-18 DIAGNOSIS — N184 Chronic kidney disease, stage 4 (severe): Secondary | ICD-10-CM | POA: Diagnosis not present

## 2019-05-18 DIAGNOSIS — E7800 Pure hypercholesterolemia, unspecified: Secondary | ICD-10-CM | POA: Diagnosis not present

## 2019-05-18 DIAGNOSIS — Z79899 Other long term (current) drug therapy: Secondary | ICD-10-CM | POA: Diagnosis not present

## 2019-05-18 DIAGNOSIS — I69351 Hemiplegia and hemiparesis following cerebral infarction affecting right dominant side: Secondary | ICD-10-CM | POA: Diagnosis not present

## 2019-05-24 DIAGNOSIS — N319 Neuromuscular dysfunction of bladder, unspecified: Secondary | ICD-10-CM | POA: Diagnosis not present

## 2019-05-24 DIAGNOSIS — R339 Retention of urine, unspecified: Secondary | ICD-10-CM | POA: Diagnosis not present

## 2019-05-24 DIAGNOSIS — Z466 Encounter for fitting and adjustment of urinary device: Secondary | ICD-10-CM | POA: Diagnosis not present

## 2019-05-25 DIAGNOSIS — I10 Essential (primary) hypertension: Secondary | ICD-10-CM | POA: Diagnosis not present

## 2019-05-25 DIAGNOSIS — Z960 Presence of urogenital implants: Secondary | ICD-10-CM | POA: Diagnosis not present

## 2019-05-25 DIAGNOSIS — G894 Chronic pain syndrome: Secondary | ICD-10-CM | POA: Diagnosis not present

## 2019-05-25 DIAGNOSIS — E118 Type 2 diabetes mellitus with unspecified complications: Secondary | ICD-10-CM | POA: Diagnosis not present

## 2019-05-29 DIAGNOSIS — E119 Type 2 diabetes mellitus without complications: Secondary | ICD-10-CM | POA: Diagnosis not present

## 2019-05-29 DIAGNOSIS — G8111 Spastic hemiplegia affecting right dominant side: Secondary | ICD-10-CM | POA: Diagnosis not present

## 2019-05-29 DIAGNOSIS — I639 Cerebral infarction, unspecified: Secondary | ICD-10-CM | POA: Diagnosis not present

## 2019-05-31 ENCOUNTER — Telehealth: Payer: Self-pay | Admitting: Internal Medicine

## 2019-05-31 NOTE — Telephone Encounter (Signed)
Rec'd  A call from [place]) [phone] ext [phone] requesting a DME form to be completed and faxed back for her [place].  She states form was faxed on 04/16/2019 and she has not rec'd it.  Please call her back.

## 2019-06-01 DIAGNOSIS — M79671 Pain in right foot: Secondary | ICD-10-CM | POA: Diagnosis not present

## 2019-06-01 DIAGNOSIS — M79605 Pain in left leg: Secondary | ICD-10-CM | POA: Diagnosis not present

## 2019-06-01 DIAGNOSIS — Z79891 Long term (current) use of opiate analgesic: Secondary | ICD-10-CM | POA: Diagnosis not present

## 2019-06-01 DIAGNOSIS — M79604 Pain in right leg: Secondary | ICD-10-CM | POA: Diagnosis not present

## 2019-06-01 DIAGNOSIS — G894 Chronic pain syndrome: Secondary | ICD-10-CM | POA: Diagnosis not present

## 2019-06-01 DIAGNOSIS — G8929 Other chronic pain: Secondary | ICD-10-CM | POA: Diagnosis not present

## 2019-06-01 DIAGNOSIS — M79672 Pain in left foot: Secondary | ICD-10-CM | POA: Diagnosis not present

## 2019-06-01 DIAGNOSIS — M542 Cervicalgia: Secondary | ICD-10-CM | POA: Diagnosis not present

## 2019-06-05 ENCOUNTER — Encounter: Payer: Self-pay | Admitting: *Deleted

## 2019-06-05 NOTE — Telephone Encounter (Signed)
Received Detailed Written Order for wound care supplies from [facility]. Form placed in PCP's box for signature. Hubbard Hartshorn, BSN, RN-BC

## 2019-06-07 NOTE — Telephone Encounter (Signed)
Signed DWO for wound supplies faxed to [place] at [phone] by [employer]. Hubbard Hartshorn, BSN, RN-BC

## 2019-06-08 DIAGNOSIS — M549 Dorsalgia, unspecified: Secondary | ICD-10-CM | POA: Diagnosis not present

## 2019-06-08 DIAGNOSIS — Z7401 Bed confinement status: Secondary | ICD-10-CM | POA: Diagnosis not present

## 2019-06-08 DIAGNOSIS — Z87440 Personal history of urinary (tract) infections: Secondary | ICD-10-CM | POA: Diagnosis not present

## 2019-06-08 DIAGNOSIS — I5032 Chronic diastolic (congestive) heart failure: Secondary | ICD-10-CM | POA: Diagnosis not present

## 2019-06-08 DIAGNOSIS — R1310 Dysphagia, unspecified: Secondary | ICD-10-CM | POA: Diagnosis not present

## 2019-06-08 DIAGNOSIS — M1990 Unspecified osteoarthritis, unspecified site: Secondary | ICD-10-CM | POA: Diagnosis not present

## 2019-06-08 DIAGNOSIS — D631 Anemia in chronic kidney disease: Secondary | ICD-10-CM | POA: Diagnosis not present

## 2019-06-08 DIAGNOSIS — G8929 Other chronic pain: Secondary | ICD-10-CM | POA: Diagnosis not present

## 2019-06-08 DIAGNOSIS — I69391 Dysphagia following cerebral infarction: Secondary | ICD-10-CM | POA: Diagnosis not present

## 2019-06-08 DIAGNOSIS — Z466 Encounter for fitting and adjustment of urinary device: Secondary | ICD-10-CM | POA: Diagnosis not present

## 2019-06-08 DIAGNOSIS — N184 Chronic kidney disease, stage 4 (severe): Secondary | ICD-10-CM | POA: Diagnosis not present

## 2019-06-08 DIAGNOSIS — I2510 Atherosclerotic heart disease of native coronary artery without angina pectoris: Secondary | ICD-10-CM | POA: Diagnosis not present

## 2019-06-08 DIAGNOSIS — E1122 Type 2 diabetes mellitus with diabetic chronic kidney disease: Secondary | ICD-10-CM | POA: Diagnosis not present

## 2019-06-08 DIAGNOSIS — E43 Unspecified severe protein-calorie malnutrition: Secondary | ICD-10-CM | POA: Diagnosis not present

## 2019-06-08 DIAGNOSIS — M419 Scoliosis, unspecified: Secondary | ICD-10-CM | POA: Diagnosis not present

## 2019-06-08 DIAGNOSIS — N139 Obstructive and reflux uropathy, unspecified: Secondary | ICD-10-CM | POA: Diagnosis not present

## 2019-06-08 DIAGNOSIS — E7800 Pure hypercholesterolemia, unspecified: Secondary | ICD-10-CM | POA: Diagnosis not present

## 2019-06-08 DIAGNOSIS — I130 Hypertensive heart and chronic kidney disease with heart failure and stage 1 through stage 4 chronic kidney disease, or unspecified chronic kidney disease: Secondary | ICD-10-CM | POA: Diagnosis not present

## 2019-06-08 DIAGNOSIS — G4733 Obstructive sleep apnea (adult) (pediatric): Secondary | ICD-10-CM | POA: Diagnosis not present

## 2019-06-08 DIAGNOSIS — L89312 Pressure ulcer of right buttock, stage 2: Secondary | ICD-10-CM | POA: Diagnosis not present

## 2019-06-08 DIAGNOSIS — I252 Old myocardial infarction: Secondary | ICD-10-CM | POA: Diagnosis not present

## 2019-06-08 DIAGNOSIS — I69351 Hemiplegia and hemiparesis following cerebral infarction affecting right dominant side: Secondary | ICD-10-CM | POA: Diagnosis not present

## 2019-06-08 DIAGNOSIS — I69320 Aphasia following cerebral infarction: Secondary | ICD-10-CM | POA: Diagnosis not present

## 2019-06-08 DIAGNOSIS — J439 Emphysema, unspecified: Secondary | ICD-10-CM | POA: Diagnosis not present

## 2019-06-08 DIAGNOSIS — Z79899 Other long term (current) drug therapy: Secondary | ICD-10-CM | POA: Diagnosis not present

## 2019-06-19 DIAGNOSIS — Z79899 Other long term (current) drug therapy: Secondary | ICD-10-CM | POA: Diagnosis not present

## 2019-06-19 DIAGNOSIS — I69391 Dysphagia following cerebral infarction: Secondary | ICD-10-CM | POA: Diagnosis not present

## 2019-06-19 DIAGNOSIS — M549 Dorsalgia, unspecified: Secondary | ICD-10-CM | POA: Diagnosis not present

## 2019-06-19 DIAGNOSIS — D631 Anemia in chronic kidney disease: Secondary | ICD-10-CM | POA: Diagnosis not present

## 2019-06-19 DIAGNOSIS — N139 Obstructive and reflux uropathy, unspecified: Secondary | ICD-10-CM | POA: Diagnosis not present

## 2019-06-19 DIAGNOSIS — I5032 Chronic diastolic (congestive) heart failure: Secondary | ICD-10-CM | POA: Diagnosis not present

## 2019-06-19 DIAGNOSIS — G4733 Obstructive sleep apnea (adult) (pediatric): Secondary | ICD-10-CM | POA: Diagnosis not present

## 2019-06-19 DIAGNOSIS — E1122 Type 2 diabetes mellitus with diabetic chronic kidney disease: Secondary | ICD-10-CM | POA: Diagnosis not present

## 2019-06-19 DIAGNOSIS — L89312 Pressure ulcer of right buttock, stage 2: Secondary | ICD-10-CM | POA: Diagnosis not present

## 2019-06-19 DIAGNOSIS — E7800 Pure hypercholesterolemia, unspecified: Secondary | ICD-10-CM | POA: Diagnosis not present

## 2019-06-19 DIAGNOSIS — G8929 Other chronic pain: Secondary | ICD-10-CM | POA: Diagnosis not present

## 2019-06-19 DIAGNOSIS — M419 Scoliosis, unspecified: Secondary | ICD-10-CM | POA: Diagnosis not present

## 2019-06-19 DIAGNOSIS — I69351 Hemiplegia and hemiparesis following cerebral infarction affecting right dominant side: Secondary | ICD-10-CM | POA: Diagnosis not present

## 2019-06-19 DIAGNOSIS — I252 Old myocardial infarction: Secondary | ICD-10-CM | POA: Diagnosis not present

## 2019-06-19 DIAGNOSIS — Z87440 Personal history of urinary (tract) infections: Secondary | ICD-10-CM | POA: Diagnosis not present

## 2019-06-19 DIAGNOSIS — I69320 Aphasia following cerebral infarction: Secondary | ICD-10-CM | POA: Diagnosis not present

## 2019-06-19 DIAGNOSIS — I130 Hypertensive heart and chronic kidney disease with heart failure and stage 1 through stage 4 chronic kidney disease, or unspecified chronic kidney disease: Secondary | ICD-10-CM | POA: Diagnosis not present

## 2019-06-19 DIAGNOSIS — N184 Chronic kidney disease, stage 4 (severe): Secondary | ICD-10-CM | POA: Diagnosis not present

## 2019-06-19 DIAGNOSIS — M1990 Unspecified osteoarthritis, unspecified site: Secondary | ICD-10-CM | POA: Diagnosis not present

## 2019-06-19 DIAGNOSIS — J439 Emphysema, unspecified: Secondary | ICD-10-CM | POA: Diagnosis not present

## 2019-06-19 DIAGNOSIS — E43 Unspecified severe protein-calorie malnutrition: Secondary | ICD-10-CM | POA: Diagnosis not present

## 2019-06-19 DIAGNOSIS — R1310 Dysphagia, unspecified: Secondary | ICD-10-CM | POA: Diagnosis not present

## 2019-06-19 DIAGNOSIS — Z7401 Bed confinement status: Secondary | ICD-10-CM | POA: Diagnosis not present

## 2019-06-19 DIAGNOSIS — Z466 Encounter for fitting and adjustment of urinary device: Secondary | ICD-10-CM | POA: Diagnosis not present

## 2019-06-19 DIAGNOSIS — I2510 Atherosclerotic heart disease of native coronary artery without angina pectoris: Secondary | ICD-10-CM | POA: Diagnosis not present

## 2019-06-23 DIAGNOSIS — N319 Neuromuscular dysfunction of bladder, unspecified: Secondary | ICD-10-CM | POA: Diagnosis not present

## 2019-06-23 DIAGNOSIS — Z466 Encounter for fitting and adjustment of urinary device: Secondary | ICD-10-CM | POA: Diagnosis not present

## 2019-06-23 DIAGNOSIS — R339 Retention of urine, unspecified: Secondary | ICD-10-CM | POA: Diagnosis not present

## 2019-06-27 DIAGNOSIS — M419 Scoliosis, unspecified: Secondary | ICD-10-CM | POA: Diagnosis not present

## 2019-06-27 DIAGNOSIS — I5032 Chronic diastolic (congestive) heart failure: Secondary | ICD-10-CM | POA: Diagnosis not present

## 2019-06-27 DIAGNOSIS — Z9181 History of falling: Secondary | ICD-10-CM | POA: Diagnosis not present

## 2019-06-27 DIAGNOSIS — M549 Dorsalgia, unspecified: Secondary | ICD-10-CM | POA: Diagnosis not present

## 2019-06-27 DIAGNOSIS — Z7401 Bed confinement status: Secondary | ICD-10-CM | POA: Diagnosis not present

## 2019-06-27 DIAGNOSIS — N139 Obstructive and reflux uropathy, unspecified: Secondary | ICD-10-CM | POA: Diagnosis not present

## 2019-06-27 DIAGNOSIS — Z79899 Other long term (current) drug therapy: Secondary | ICD-10-CM | POA: Diagnosis not present

## 2019-06-27 DIAGNOSIS — I130 Hypertensive heart and chronic kidney disease with heart failure and stage 1 through stage 4 chronic kidney disease, or unspecified chronic kidney disease: Secondary | ICD-10-CM | POA: Diagnosis not present

## 2019-06-27 DIAGNOSIS — I639 Cerebral infarction, unspecified: Secondary | ICD-10-CM | POA: Diagnosis not present

## 2019-06-27 DIAGNOSIS — N184 Chronic kidney disease, stage 4 (severe): Secondary | ICD-10-CM | POA: Diagnosis not present

## 2019-06-27 DIAGNOSIS — I69351 Hemiplegia and hemiparesis following cerebral infarction affecting right dominant side: Secondary | ICD-10-CM | POA: Diagnosis not present

## 2019-06-27 DIAGNOSIS — Z87440 Personal history of urinary (tract) infections: Secondary | ICD-10-CM | POA: Diagnosis not present

## 2019-06-27 DIAGNOSIS — I69391 Dysphagia following cerebral infarction: Secondary | ICD-10-CM | POA: Diagnosis not present

## 2019-06-27 DIAGNOSIS — E119 Type 2 diabetes mellitus without complications: Secondary | ICD-10-CM | POA: Diagnosis not present

## 2019-06-27 DIAGNOSIS — E43 Unspecified severe protein-calorie malnutrition: Secondary | ICD-10-CM | POA: Diagnosis not present

## 2019-06-27 DIAGNOSIS — I252 Old myocardial infarction: Secondary | ICD-10-CM | POA: Diagnosis not present

## 2019-06-27 DIAGNOSIS — G8929 Other chronic pain: Secondary | ICD-10-CM | POA: Diagnosis not present

## 2019-06-27 DIAGNOSIS — E7800 Pure hypercholesterolemia, unspecified: Secondary | ICD-10-CM | POA: Diagnosis not present

## 2019-06-27 DIAGNOSIS — J439 Emphysema, unspecified: Secondary | ICD-10-CM | POA: Diagnosis not present

## 2019-06-27 DIAGNOSIS — I69320 Aphasia following cerebral infarction: Secondary | ICD-10-CM | POA: Diagnosis not present

## 2019-06-27 DIAGNOSIS — I2510 Atherosclerotic heart disease of native coronary artery without angina pectoris: Secondary | ICD-10-CM | POA: Diagnosis not present

## 2019-06-27 DIAGNOSIS — M1990 Unspecified osteoarthritis, unspecified site: Secondary | ICD-10-CM | POA: Diagnosis not present

## 2019-06-27 DIAGNOSIS — R1310 Dysphagia, unspecified: Secondary | ICD-10-CM | POA: Diagnosis not present

## 2019-06-27 DIAGNOSIS — D631 Anemia in chronic kidney disease: Secondary | ICD-10-CM | POA: Diagnosis not present

## 2019-06-27 DIAGNOSIS — E1122 Type 2 diabetes mellitus with diabetic chronic kidney disease: Secondary | ICD-10-CM | POA: Diagnosis not present

## 2019-06-27 DIAGNOSIS — G8111 Spastic hemiplegia affecting right dominant side: Secondary | ICD-10-CM | POA: Diagnosis not present

## 2019-06-27 DIAGNOSIS — Z466 Encounter for fitting and adjustment of urinary device: Secondary | ICD-10-CM | POA: Diagnosis not present

## 2019-06-27 DIAGNOSIS — G4733 Obstructive sleep apnea (adult) (pediatric): Secondary | ICD-10-CM | POA: Diagnosis not present

## 2019-06-29 DIAGNOSIS — N139 Obstructive and reflux uropathy, unspecified: Secondary | ICD-10-CM | POA: Diagnosis not present

## 2019-06-29 DIAGNOSIS — M419 Scoliosis, unspecified: Secondary | ICD-10-CM | POA: Diagnosis not present

## 2019-06-29 DIAGNOSIS — Z7401 Bed confinement status: Secondary | ICD-10-CM | POA: Diagnosis not present

## 2019-06-29 DIAGNOSIS — M549 Dorsalgia, unspecified: Secondary | ICD-10-CM | POA: Diagnosis not present

## 2019-06-29 DIAGNOSIS — M1990 Unspecified osteoarthritis, unspecified site: Secondary | ICD-10-CM | POA: Diagnosis not present

## 2019-06-29 DIAGNOSIS — Z79899 Other long term (current) drug therapy: Secondary | ICD-10-CM | POA: Diagnosis not present

## 2019-06-29 DIAGNOSIS — G8929 Other chronic pain: Secondary | ICD-10-CM | POA: Diagnosis not present

## 2019-06-29 DIAGNOSIS — E43 Unspecified severe protein-calorie malnutrition: Secondary | ICD-10-CM | POA: Diagnosis not present

## 2019-06-29 DIAGNOSIS — D631 Anemia in chronic kidney disease: Secondary | ICD-10-CM | POA: Diagnosis not present

## 2019-06-29 DIAGNOSIS — I130 Hypertensive heart and chronic kidney disease with heart failure and stage 1 through stage 4 chronic kidney disease, or unspecified chronic kidney disease: Secondary | ICD-10-CM | POA: Diagnosis not present

## 2019-06-29 DIAGNOSIS — Z9181 History of falling: Secondary | ICD-10-CM | POA: Diagnosis not present

## 2019-06-29 DIAGNOSIS — I2510 Atherosclerotic heart disease of native coronary artery without angina pectoris: Secondary | ICD-10-CM | POA: Diagnosis not present

## 2019-06-29 DIAGNOSIS — I69351 Hemiplegia and hemiparesis following cerebral infarction affecting right dominant side: Secondary | ICD-10-CM | POA: Diagnosis not present

## 2019-06-29 DIAGNOSIS — E1122 Type 2 diabetes mellitus with diabetic chronic kidney disease: Secondary | ICD-10-CM | POA: Diagnosis not present

## 2019-06-29 DIAGNOSIS — I252 Old myocardial infarction: Secondary | ICD-10-CM | POA: Diagnosis not present

## 2019-06-29 DIAGNOSIS — N184 Chronic kidney disease, stage 4 (severe): Secondary | ICD-10-CM | POA: Diagnosis not present

## 2019-06-29 DIAGNOSIS — J439 Emphysema, unspecified: Secondary | ICD-10-CM | POA: Diagnosis not present

## 2019-06-29 DIAGNOSIS — Z466 Encounter for fitting and adjustment of urinary device: Secondary | ICD-10-CM | POA: Diagnosis not present

## 2019-06-29 DIAGNOSIS — I69391 Dysphagia following cerebral infarction: Secondary | ICD-10-CM | POA: Diagnosis not present

## 2019-06-29 DIAGNOSIS — R1310 Dysphagia, unspecified: Secondary | ICD-10-CM | POA: Diagnosis not present

## 2019-06-29 DIAGNOSIS — I69320 Aphasia following cerebral infarction: Secondary | ICD-10-CM | POA: Diagnosis not present

## 2019-06-29 DIAGNOSIS — I5032 Chronic diastolic (congestive) heart failure: Secondary | ICD-10-CM | POA: Diagnosis not present

## 2019-06-29 DIAGNOSIS — E7800 Pure hypercholesterolemia, unspecified: Secondary | ICD-10-CM | POA: Diagnosis not present

## 2019-06-29 DIAGNOSIS — Z87440 Personal history of urinary (tract) infections: Secondary | ICD-10-CM | POA: Diagnosis not present

## 2019-06-29 DIAGNOSIS — G4733 Obstructive sleep apnea (adult) (pediatric): Secondary | ICD-10-CM | POA: Diagnosis not present

## 2019-07-04 DIAGNOSIS — G4733 Obstructive sleep apnea (adult) (pediatric): Secondary | ICD-10-CM | POA: Diagnosis not present

## 2019-07-04 DIAGNOSIS — I69391 Dysphagia following cerebral infarction: Secondary | ICD-10-CM | POA: Diagnosis not present

## 2019-07-04 DIAGNOSIS — Z466 Encounter for fitting and adjustment of urinary device: Secondary | ICD-10-CM | POA: Diagnosis not present

## 2019-07-04 DIAGNOSIS — E43 Unspecified severe protein-calorie malnutrition: Secondary | ICD-10-CM | POA: Diagnosis not present

## 2019-07-04 DIAGNOSIS — Z87440 Personal history of urinary (tract) infections: Secondary | ICD-10-CM | POA: Diagnosis not present

## 2019-07-04 DIAGNOSIS — I252 Old myocardial infarction: Secondary | ICD-10-CM | POA: Diagnosis not present

## 2019-07-04 DIAGNOSIS — Z7401 Bed confinement status: Secondary | ICD-10-CM | POA: Diagnosis not present

## 2019-07-04 DIAGNOSIS — R1310 Dysphagia, unspecified: Secondary | ICD-10-CM | POA: Diagnosis not present

## 2019-07-04 DIAGNOSIS — D631 Anemia in chronic kidney disease: Secondary | ICD-10-CM | POA: Diagnosis not present

## 2019-07-04 DIAGNOSIS — M549 Dorsalgia, unspecified: Secondary | ICD-10-CM | POA: Diagnosis not present

## 2019-07-04 DIAGNOSIS — N139 Obstructive and reflux uropathy, unspecified: Secondary | ICD-10-CM | POA: Diagnosis not present

## 2019-07-04 DIAGNOSIS — I69320 Aphasia following cerebral infarction: Secondary | ICD-10-CM | POA: Diagnosis not present

## 2019-07-04 DIAGNOSIS — Z79899 Other long term (current) drug therapy: Secondary | ICD-10-CM | POA: Diagnosis not present

## 2019-07-04 DIAGNOSIS — E1122 Type 2 diabetes mellitus with diabetic chronic kidney disease: Secondary | ICD-10-CM | POA: Diagnosis not present

## 2019-07-04 DIAGNOSIS — I130 Hypertensive heart and chronic kidney disease with heart failure and stage 1 through stage 4 chronic kidney disease, or unspecified chronic kidney disease: Secondary | ICD-10-CM | POA: Diagnosis not present

## 2019-07-04 DIAGNOSIS — I2510 Atherosclerotic heart disease of native coronary artery without angina pectoris: Secondary | ICD-10-CM | POA: Diagnosis not present

## 2019-07-04 DIAGNOSIS — G8929 Other chronic pain: Secondary | ICD-10-CM | POA: Diagnosis not present

## 2019-07-04 DIAGNOSIS — N184 Chronic kidney disease, stage 4 (severe): Secondary | ICD-10-CM | POA: Diagnosis not present

## 2019-07-04 DIAGNOSIS — J439 Emphysema, unspecified: Secondary | ICD-10-CM | POA: Diagnosis not present

## 2019-07-04 DIAGNOSIS — M1990 Unspecified osteoarthritis, unspecified site: Secondary | ICD-10-CM | POA: Diagnosis not present

## 2019-07-04 DIAGNOSIS — I69351 Hemiplegia and hemiparesis following cerebral infarction affecting right dominant side: Secondary | ICD-10-CM | POA: Diagnosis not present

## 2019-07-04 DIAGNOSIS — E7800 Pure hypercholesterolemia, unspecified: Secondary | ICD-10-CM | POA: Diagnosis not present

## 2019-07-04 DIAGNOSIS — I5032 Chronic diastolic (congestive) heart failure: Secondary | ICD-10-CM | POA: Diagnosis not present

## 2019-07-04 DIAGNOSIS — Z9181 History of falling: Secondary | ICD-10-CM | POA: Diagnosis not present

## 2019-07-04 DIAGNOSIS — M419 Scoliosis, unspecified: Secondary | ICD-10-CM | POA: Diagnosis not present

## 2019-07-06 DIAGNOSIS — N184 Chronic kidney disease, stage 4 (severe): Secondary | ICD-10-CM | POA: Diagnosis not present

## 2019-07-06 DIAGNOSIS — Z9181 History of falling: Secondary | ICD-10-CM | POA: Diagnosis not present

## 2019-07-06 DIAGNOSIS — I69391 Dysphagia following cerebral infarction: Secondary | ICD-10-CM | POA: Diagnosis not present

## 2019-07-06 DIAGNOSIS — I130 Hypertensive heart and chronic kidney disease with heart failure and stage 1 through stage 4 chronic kidney disease, or unspecified chronic kidney disease: Secondary | ICD-10-CM | POA: Diagnosis not present

## 2019-07-06 DIAGNOSIS — I252 Old myocardial infarction: Secondary | ICD-10-CM | POA: Diagnosis not present

## 2019-07-06 DIAGNOSIS — M549 Dorsalgia, unspecified: Secondary | ICD-10-CM | POA: Diagnosis not present

## 2019-07-06 DIAGNOSIS — N139 Obstructive and reflux uropathy, unspecified: Secondary | ICD-10-CM | POA: Diagnosis not present

## 2019-07-06 DIAGNOSIS — D631 Anemia in chronic kidney disease: Secondary | ICD-10-CM | POA: Diagnosis not present

## 2019-07-06 DIAGNOSIS — E7800 Pure hypercholesterolemia, unspecified: Secondary | ICD-10-CM | POA: Diagnosis not present

## 2019-07-06 DIAGNOSIS — Z79899 Other long term (current) drug therapy: Secondary | ICD-10-CM | POA: Diagnosis not present

## 2019-07-06 DIAGNOSIS — Z466 Encounter for fitting and adjustment of urinary device: Secondary | ICD-10-CM | POA: Diagnosis not present

## 2019-07-06 DIAGNOSIS — G8929 Other chronic pain: Secondary | ICD-10-CM | POA: Diagnosis not present

## 2019-07-06 DIAGNOSIS — E43 Unspecified severe protein-calorie malnutrition: Secondary | ICD-10-CM | POA: Diagnosis not present

## 2019-07-06 DIAGNOSIS — Z87440 Personal history of urinary (tract) infections: Secondary | ICD-10-CM | POA: Diagnosis not present

## 2019-07-06 DIAGNOSIS — M1990 Unspecified osteoarthritis, unspecified site: Secondary | ICD-10-CM | POA: Diagnosis not present

## 2019-07-06 DIAGNOSIS — R1310 Dysphagia, unspecified: Secondary | ICD-10-CM | POA: Diagnosis not present

## 2019-07-06 DIAGNOSIS — I5032 Chronic diastolic (congestive) heart failure: Secondary | ICD-10-CM | POA: Diagnosis not present

## 2019-07-06 DIAGNOSIS — G4733 Obstructive sleep apnea (adult) (pediatric): Secondary | ICD-10-CM | POA: Diagnosis not present

## 2019-07-06 DIAGNOSIS — I69351 Hemiplegia and hemiparesis following cerebral infarction affecting right dominant side: Secondary | ICD-10-CM | POA: Diagnosis not present

## 2019-07-06 DIAGNOSIS — J439 Emphysema, unspecified: Secondary | ICD-10-CM | POA: Diagnosis not present

## 2019-07-06 DIAGNOSIS — Z7401 Bed confinement status: Secondary | ICD-10-CM | POA: Diagnosis not present

## 2019-07-06 DIAGNOSIS — I69320 Aphasia following cerebral infarction: Secondary | ICD-10-CM | POA: Diagnosis not present

## 2019-07-06 DIAGNOSIS — I2510 Atherosclerotic heart disease of native coronary artery without angina pectoris: Secondary | ICD-10-CM | POA: Diagnosis not present

## 2019-07-06 DIAGNOSIS — M419 Scoliosis, unspecified: Secondary | ICD-10-CM | POA: Diagnosis not present

## 2019-07-06 DIAGNOSIS — E1122 Type 2 diabetes mellitus with diabetic chronic kidney disease: Secondary | ICD-10-CM | POA: Diagnosis not present

## 2019-07-11 DIAGNOSIS — N139 Obstructive and reflux uropathy, unspecified: Secondary | ICD-10-CM | POA: Diagnosis not present

## 2019-07-11 DIAGNOSIS — Z7401 Bed confinement status: Secondary | ICD-10-CM | POA: Diagnosis not present

## 2019-07-11 DIAGNOSIS — M419 Scoliosis, unspecified: Secondary | ICD-10-CM | POA: Diagnosis not present

## 2019-07-11 DIAGNOSIS — Z87440 Personal history of urinary (tract) infections: Secondary | ICD-10-CM | POA: Diagnosis not present

## 2019-07-11 DIAGNOSIS — J439 Emphysema, unspecified: Secondary | ICD-10-CM | POA: Diagnosis not present

## 2019-07-11 DIAGNOSIS — I252 Old myocardial infarction: Secondary | ICD-10-CM | POA: Diagnosis not present

## 2019-07-11 DIAGNOSIS — G4733 Obstructive sleep apnea (adult) (pediatric): Secondary | ICD-10-CM | POA: Diagnosis not present

## 2019-07-11 DIAGNOSIS — E43 Unspecified severe protein-calorie malnutrition: Secondary | ICD-10-CM | POA: Diagnosis not present

## 2019-07-11 DIAGNOSIS — I130 Hypertensive heart and chronic kidney disease with heart failure and stage 1 through stage 4 chronic kidney disease, or unspecified chronic kidney disease: Secondary | ICD-10-CM | POA: Diagnosis not present

## 2019-07-11 DIAGNOSIS — Z9181 History of falling: Secondary | ICD-10-CM | POA: Diagnosis not present

## 2019-07-11 DIAGNOSIS — D631 Anemia in chronic kidney disease: Secondary | ICD-10-CM | POA: Diagnosis not present

## 2019-07-11 DIAGNOSIS — G8929 Other chronic pain: Secondary | ICD-10-CM | POA: Diagnosis not present

## 2019-07-11 DIAGNOSIS — I5032 Chronic diastolic (congestive) heart failure: Secondary | ICD-10-CM | POA: Diagnosis not present

## 2019-07-11 DIAGNOSIS — Z466 Encounter for fitting and adjustment of urinary device: Secondary | ICD-10-CM | POA: Diagnosis not present

## 2019-07-11 DIAGNOSIS — I69391 Dysphagia following cerebral infarction: Secondary | ICD-10-CM | POA: Diagnosis not present

## 2019-07-11 DIAGNOSIS — N184 Chronic kidney disease, stage 4 (severe): Secondary | ICD-10-CM | POA: Diagnosis not present

## 2019-07-11 DIAGNOSIS — M549 Dorsalgia, unspecified: Secondary | ICD-10-CM | POA: Diagnosis not present

## 2019-07-11 DIAGNOSIS — Z79899 Other long term (current) drug therapy: Secondary | ICD-10-CM | POA: Diagnosis not present

## 2019-07-11 DIAGNOSIS — I69320 Aphasia following cerebral infarction: Secondary | ICD-10-CM | POA: Diagnosis not present

## 2019-07-11 DIAGNOSIS — I2510 Atherosclerotic heart disease of native coronary artery without angina pectoris: Secondary | ICD-10-CM | POA: Diagnosis not present

## 2019-07-11 DIAGNOSIS — M1990 Unspecified osteoarthritis, unspecified site: Secondary | ICD-10-CM | POA: Diagnosis not present

## 2019-07-11 DIAGNOSIS — E1122 Type 2 diabetes mellitus with diabetic chronic kidney disease: Secondary | ICD-10-CM | POA: Diagnosis not present

## 2019-07-11 DIAGNOSIS — R1310 Dysphagia, unspecified: Secondary | ICD-10-CM | POA: Diagnosis not present

## 2019-07-11 DIAGNOSIS — E7800 Pure hypercholesterolemia, unspecified: Secondary | ICD-10-CM | POA: Diagnosis not present

## 2019-07-11 DIAGNOSIS — I69351 Hemiplegia and hemiparesis following cerebral infarction affecting right dominant side: Secondary | ICD-10-CM | POA: Diagnosis not present

## 2019-07-13 DIAGNOSIS — I69391 Dysphagia following cerebral infarction: Secondary | ICD-10-CM | POA: Diagnosis not present

## 2019-07-13 DIAGNOSIS — M419 Scoliosis, unspecified: Secondary | ICD-10-CM | POA: Diagnosis not present

## 2019-07-13 DIAGNOSIS — M549 Dorsalgia, unspecified: Secondary | ICD-10-CM | POA: Diagnosis not present

## 2019-07-13 DIAGNOSIS — D631 Anemia in chronic kidney disease: Secondary | ICD-10-CM | POA: Diagnosis not present

## 2019-07-13 DIAGNOSIS — E1122 Type 2 diabetes mellitus with diabetic chronic kidney disease: Secondary | ICD-10-CM | POA: Diagnosis not present

## 2019-07-13 DIAGNOSIS — I69320 Aphasia following cerebral infarction: Secondary | ICD-10-CM | POA: Diagnosis not present

## 2019-07-13 DIAGNOSIS — G4733 Obstructive sleep apnea (adult) (pediatric): Secondary | ICD-10-CM | POA: Diagnosis not present

## 2019-07-13 DIAGNOSIS — Z87440 Personal history of urinary (tract) infections: Secondary | ICD-10-CM | POA: Diagnosis not present

## 2019-07-13 DIAGNOSIS — E7800 Pure hypercholesterolemia, unspecified: Secondary | ICD-10-CM | POA: Diagnosis not present

## 2019-07-13 DIAGNOSIS — Z79899 Other long term (current) drug therapy: Secondary | ICD-10-CM | POA: Diagnosis not present

## 2019-07-13 DIAGNOSIS — I5032 Chronic diastolic (congestive) heart failure: Secondary | ICD-10-CM | POA: Diagnosis not present

## 2019-07-13 DIAGNOSIS — N184 Chronic kidney disease, stage 4 (severe): Secondary | ICD-10-CM | POA: Diagnosis not present

## 2019-07-13 DIAGNOSIS — M1990 Unspecified osteoarthritis, unspecified site: Secondary | ICD-10-CM | POA: Diagnosis not present

## 2019-07-13 DIAGNOSIS — G8929 Other chronic pain: Secondary | ICD-10-CM | POA: Diagnosis not present

## 2019-07-13 DIAGNOSIS — Z466 Encounter for fitting and adjustment of urinary device: Secondary | ICD-10-CM | POA: Diagnosis not present

## 2019-07-13 DIAGNOSIS — I69351 Hemiplegia and hemiparesis following cerebral infarction affecting right dominant side: Secondary | ICD-10-CM | POA: Diagnosis not present

## 2019-07-13 DIAGNOSIS — Z9181 History of falling: Secondary | ICD-10-CM | POA: Diagnosis not present

## 2019-07-13 DIAGNOSIS — R1310 Dysphagia, unspecified: Secondary | ICD-10-CM | POA: Diagnosis not present

## 2019-07-13 DIAGNOSIS — E43 Unspecified severe protein-calorie malnutrition: Secondary | ICD-10-CM | POA: Diagnosis not present

## 2019-07-13 DIAGNOSIS — I130 Hypertensive heart and chronic kidney disease with heart failure and stage 1 through stage 4 chronic kidney disease, or unspecified chronic kidney disease: Secondary | ICD-10-CM | POA: Diagnosis not present

## 2019-07-13 DIAGNOSIS — I252 Old myocardial infarction: Secondary | ICD-10-CM | POA: Diagnosis not present

## 2019-07-13 DIAGNOSIS — J439 Emphysema, unspecified: Secondary | ICD-10-CM | POA: Diagnosis not present

## 2019-07-13 DIAGNOSIS — I2510 Atherosclerotic heart disease of native coronary artery without angina pectoris: Secondary | ICD-10-CM | POA: Diagnosis not present

## 2019-07-13 DIAGNOSIS — N139 Obstructive and reflux uropathy, unspecified: Secondary | ICD-10-CM | POA: Diagnosis not present

## 2019-07-13 DIAGNOSIS — Z7401 Bed confinement status: Secondary | ICD-10-CM | POA: Diagnosis not present

## 2019-07-18 DIAGNOSIS — G4733 Obstructive sleep apnea (adult) (pediatric): Secondary | ICD-10-CM | POA: Diagnosis not present

## 2019-07-18 DIAGNOSIS — I69391 Dysphagia following cerebral infarction: Secondary | ICD-10-CM | POA: Diagnosis not present

## 2019-07-18 DIAGNOSIS — E1122 Type 2 diabetes mellitus with diabetic chronic kidney disease: Secondary | ICD-10-CM | POA: Diagnosis not present

## 2019-07-18 DIAGNOSIS — I252 Old myocardial infarction: Secondary | ICD-10-CM | POA: Diagnosis not present

## 2019-07-18 DIAGNOSIS — I69320 Aphasia following cerebral infarction: Secondary | ICD-10-CM | POA: Diagnosis not present

## 2019-07-18 DIAGNOSIS — N184 Chronic kidney disease, stage 4 (severe): Secondary | ICD-10-CM | POA: Diagnosis not present

## 2019-07-18 DIAGNOSIS — M1990 Unspecified osteoarthritis, unspecified site: Secondary | ICD-10-CM | POA: Diagnosis not present

## 2019-07-18 DIAGNOSIS — M419 Scoliosis, unspecified: Secondary | ICD-10-CM | POA: Diagnosis not present

## 2019-07-18 DIAGNOSIS — I69351 Hemiplegia and hemiparesis following cerebral infarction affecting right dominant side: Secondary | ICD-10-CM | POA: Diagnosis not present

## 2019-07-18 DIAGNOSIS — Z9181 History of falling: Secondary | ICD-10-CM | POA: Diagnosis not present

## 2019-07-18 DIAGNOSIS — Z7401 Bed confinement status: Secondary | ICD-10-CM | POA: Diagnosis not present

## 2019-07-18 DIAGNOSIS — D631 Anemia in chronic kidney disease: Secondary | ICD-10-CM | POA: Diagnosis not present

## 2019-07-18 DIAGNOSIS — N139 Obstructive and reflux uropathy, unspecified: Secondary | ICD-10-CM | POA: Diagnosis not present

## 2019-07-18 DIAGNOSIS — J439 Emphysema, unspecified: Secondary | ICD-10-CM | POA: Diagnosis not present

## 2019-07-18 DIAGNOSIS — Z79899 Other long term (current) drug therapy: Secondary | ICD-10-CM | POA: Diagnosis not present

## 2019-07-18 DIAGNOSIS — M549 Dorsalgia, unspecified: Secondary | ICD-10-CM | POA: Diagnosis not present

## 2019-07-18 DIAGNOSIS — R1310 Dysphagia, unspecified: Secondary | ICD-10-CM | POA: Diagnosis not present

## 2019-07-18 DIAGNOSIS — I130 Hypertensive heart and chronic kidney disease with heart failure and stage 1 through stage 4 chronic kidney disease, or unspecified chronic kidney disease: Secondary | ICD-10-CM | POA: Diagnosis not present

## 2019-07-18 DIAGNOSIS — E7800 Pure hypercholesterolemia, unspecified: Secondary | ICD-10-CM | POA: Diagnosis not present

## 2019-07-18 DIAGNOSIS — G8929 Other chronic pain: Secondary | ICD-10-CM | POA: Diagnosis not present

## 2019-07-18 DIAGNOSIS — E43 Unspecified severe protein-calorie malnutrition: Secondary | ICD-10-CM | POA: Diagnosis not present

## 2019-07-18 DIAGNOSIS — Z87440 Personal history of urinary (tract) infections: Secondary | ICD-10-CM | POA: Diagnosis not present

## 2019-07-18 DIAGNOSIS — Z466 Encounter for fitting and adjustment of urinary device: Secondary | ICD-10-CM | POA: Diagnosis not present

## 2019-07-18 DIAGNOSIS — I2510 Atherosclerotic heart disease of native coronary artery without angina pectoris: Secondary | ICD-10-CM | POA: Diagnosis not present

## 2019-07-18 DIAGNOSIS — I5032 Chronic diastolic (congestive) heart failure: Secondary | ICD-10-CM | POA: Diagnosis not present

## 2019-07-20 DIAGNOSIS — Z9181 History of falling: Secondary | ICD-10-CM | POA: Diagnosis not present

## 2019-07-20 DIAGNOSIS — Z466 Encounter for fitting and adjustment of urinary device: Secondary | ICD-10-CM | POA: Diagnosis not present

## 2019-07-20 DIAGNOSIS — D631 Anemia in chronic kidney disease: Secondary | ICD-10-CM | POA: Diagnosis not present

## 2019-07-20 DIAGNOSIS — M1990 Unspecified osteoarthritis, unspecified site: Secondary | ICD-10-CM | POA: Diagnosis not present

## 2019-07-20 DIAGNOSIS — J439 Emphysema, unspecified: Secondary | ICD-10-CM | POA: Diagnosis not present

## 2019-07-20 DIAGNOSIS — I252 Old myocardial infarction: Secondary | ICD-10-CM | POA: Diagnosis not present

## 2019-07-20 DIAGNOSIS — G8929 Other chronic pain: Secondary | ICD-10-CM | POA: Diagnosis not present

## 2019-07-20 DIAGNOSIS — E43 Unspecified severe protein-calorie malnutrition: Secondary | ICD-10-CM | POA: Diagnosis not present

## 2019-07-20 DIAGNOSIS — I69320 Aphasia following cerebral infarction: Secondary | ICD-10-CM | POA: Diagnosis not present

## 2019-07-20 DIAGNOSIS — I130 Hypertensive heart and chronic kidney disease with heart failure and stage 1 through stage 4 chronic kidney disease, or unspecified chronic kidney disease: Secondary | ICD-10-CM | POA: Diagnosis not present

## 2019-07-20 DIAGNOSIS — Z7401 Bed confinement status: Secondary | ICD-10-CM | POA: Diagnosis not present

## 2019-07-20 DIAGNOSIS — I2510 Atherosclerotic heart disease of native coronary artery without angina pectoris: Secondary | ICD-10-CM | POA: Diagnosis not present

## 2019-07-20 DIAGNOSIS — M549 Dorsalgia, unspecified: Secondary | ICD-10-CM | POA: Diagnosis not present

## 2019-07-20 DIAGNOSIS — M419 Scoliosis, unspecified: Secondary | ICD-10-CM | POA: Diagnosis not present

## 2019-07-20 DIAGNOSIS — G4733 Obstructive sleep apnea (adult) (pediatric): Secondary | ICD-10-CM | POA: Diagnosis not present

## 2019-07-20 DIAGNOSIS — I5032 Chronic diastolic (congestive) heart failure: Secondary | ICD-10-CM | POA: Diagnosis not present

## 2019-07-20 DIAGNOSIS — E1122 Type 2 diabetes mellitus with diabetic chronic kidney disease: Secondary | ICD-10-CM | POA: Diagnosis not present

## 2019-07-20 DIAGNOSIS — E7800 Pure hypercholesterolemia, unspecified: Secondary | ICD-10-CM | POA: Diagnosis not present

## 2019-07-20 DIAGNOSIS — Z79899 Other long term (current) drug therapy: Secondary | ICD-10-CM | POA: Diagnosis not present

## 2019-07-20 DIAGNOSIS — I69391 Dysphagia following cerebral infarction: Secondary | ICD-10-CM | POA: Diagnosis not present

## 2019-07-20 DIAGNOSIS — N139 Obstructive and reflux uropathy, unspecified: Secondary | ICD-10-CM | POA: Diagnosis not present

## 2019-07-20 DIAGNOSIS — Z87440 Personal history of urinary (tract) infections: Secondary | ICD-10-CM | POA: Diagnosis not present

## 2019-07-20 DIAGNOSIS — R1310 Dysphagia, unspecified: Secondary | ICD-10-CM | POA: Diagnosis not present

## 2019-07-20 DIAGNOSIS — I69351 Hemiplegia and hemiparesis following cerebral infarction affecting right dominant side: Secondary | ICD-10-CM | POA: Diagnosis not present

## 2019-07-20 DIAGNOSIS — N184 Chronic kidney disease, stage 4 (severe): Secondary | ICD-10-CM | POA: Diagnosis not present

## 2019-07-24 DIAGNOSIS — I130 Hypertensive heart and chronic kidney disease with heart failure and stage 1 through stage 4 chronic kidney disease, or unspecified chronic kidney disease: Secondary | ICD-10-CM | POA: Diagnosis not present

## 2019-07-24 DIAGNOSIS — Z9181 History of falling: Secondary | ICD-10-CM | POA: Diagnosis not present

## 2019-07-24 DIAGNOSIS — I2510 Atherosclerotic heart disease of native coronary artery without angina pectoris: Secondary | ICD-10-CM | POA: Diagnosis not present

## 2019-07-24 DIAGNOSIS — N184 Chronic kidney disease, stage 4 (severe): Secondary | ICD-10-CM | POA: Diagnosis not present

## 2019-07-24 DIAGNOSIS — Z87440 Personal history of urinary (tract) infections: Secondary | ICD-10-CM | POA: Diagnosis not present

## 2019-07-24 DIAGNOSIS — E43 Unspecified severe protein-calorie malnutrition: Secondary | ICD-10-CM | POA: Diagnosis not present

## 2019-07-24 DIAGNOSIS — M1990 Unspecified osteoarthritis, unspecified site: Secondary | ICD-10-CM | POA: Diagnosis not present

## 2019-07-24 DIAGNOSIS — I5032 Chronic diastolic (congestive) heart failure: Secondary | ICD-10-CM | POA: Diagnosis not present

## 2019-07-24 DIAGNOSIS — M549 Dorsalgia, unspecified: Secondary | ICD-10-CM | POA: Diagnosis not present

## 2019-07-24 DIAGNOSIS — G8929 Other chronic pain: Secondary | ICD-10-CM | POA: Diagnosis not present

## 2019-07-24 DIAGNOSIS — I69320 Aphasia following cerebral infarction: Secondary | ICD-10-CM | POA: Diagnosis not present

## 2019-07-24 DIAGNOSIS — I252 Old myocardial infarction: Secondary | ICD-10-CM | POA: Diagnosis not present

## 2019-07-24 DIAGNOSIS — N139 Obstructive and reflux uropathy, unspecified: Secondary | ICD-10-CM | POA: Diagnosis not present

## 2019-07-24 DIAGNOSIS — Z7401 Bed confinement status: Secondary | ICD-10-CM | POA: Diagnosis not present

## 2019-07-24 DIAGNOSIS — R1310 Dysphagia, unspecified: Secondary | ICD-10-CM | POA: Diagnosis not present

## 2019-07-24 DIAGNOSIS — E7800 Pure hypercholesterolemia, unspecified: Secondary | ICD-10-CM | POA: Diagnosis not present

## 2019-07-24 DIAGNOSIS — D631 Anemia in chronic kidney disease: Secondary | ICD-10-CM | POA: Diagnosis not present

## 2019-07-24 DIAGNOSIS — E1122 Type 2 diabetes mellitus with diabetic chronic kidney disease: Secondary | ICD-10-CM | POA: Diagnosis not present

## 2019-07-24 DIAGNOSIS — I69391 Dysphagia following cerebral infarction: Secondary | ICD-10-CM | POA: Diagnosis not present

## 2019-07-24 DIAGNOSIS — G4733 Obstructive sleep apnea (adult) (pediatric): Secondary | ICD-10-CM | POA: Diagnosis not present

## 2019-07-24 DIAGNOSIS — Z79899 Other long term (current) drug therapy: Secondary | ICD-10-CM | POA: Diagnosis not present

## 2019-07-24 DIAGNOSIS — J439 Emphysema, unspecified: Secondary | ICD-10-CM | POA: Diagnosis not present

## 2019-07-24 DIAGNOSIS — I69351 Hemiplegia and hemiparesis following cerebral infarction affecting right dominant side: Secondary | ICD-10-CM | POA: Diagnosis not present

## 2019-07-24 DIAGNOSIS — M419 Scoliosis, unspecified: Secondary | ICD-10-CM | POA: Diagnosis not present

## 2019-07-24 DIAGNOSIS — Z466 Encounter for fitting and adjustment of urinary device: Secondary | ICD-10-CM | POA: Diagnosis not present

## 2019-07-27 DIAGNOSIS — Z9181 History of falling: Secondary | ICD-10-CM | POA: Diagnosis not present

## 2019-07-27 DIAGNOSIS — G8929 Other chronic pain: Secondary | ICD-10-CM | POA: Diagnosis not present

## 2019-07-27 DIAGNOSIS — I69320 Aphasia following cerebral infarction: Secondary | ICD-10-CM | POA: Diagnosis not present

## 2019-07-27 DIAGNOSIS — I2510 Atherosclerotic heart disease of native coronary artery without angina pectoris: Secondary | ICD-10-CM | POA: Diagnosis not present

## 2019-07-27 DIAGNOSIS — M419 Scoliosis, unspecified: Secondary | ICD-10-CM | POA: Diagnosis not present

## 2019-07-27 DIAGNOSIS — I69391 Dysphagia following cerebral infarction: Secondary | ICD-10-CM | POA: Diagnosis not present

## 2019-07-27 DIAGNOSIS — M1990 Unspecified osteoarthritis, unspecified site: Secondary | ICD-10-CM | POA: Diagnosis not present

## 2019-07-27 DIAGNOSIS — Z79899 Other long term (current) drug therapy: Secondary | ICD-10-CM | POA: Diagnosis not present

## 2019-07-27 DIAGNOSIS — E43 Unspecified severe protein-calorie malnutrition: Secondary | ICD-10-CM | POA: Diagnosis not present

## 2019-07-27 DIAGNOSIS — I252 Old myocardial infarction: Secondary | ICD-10-CM | POA: Diagnosis not present

## 2019-07-27 DIAGNOSIS — I5032 Chronic diastolic (congestive) heart failure: Secondary | ICD-10-CM | POA: Diagnosis not present

## 2019-07-27 DIAGNOSIS — N184 Chronic kidney disease, stage 4 (severe): Secondary | ICD-10-CM | POA: Diagnosis not present

## 2019-07-27 DIAGNOSIS — G4733 Obstructive sleep apnea (adult) (pediatric): Secondary | ICD-10-CM | POA: Diagnosis not present

## 2019-07-27 DIAGNOSIS — Z87440 Personal history of urinary (tract) infections: Secondary | ICD-10-CM | POA: Diagnosis not present

## 2019-07-27 DIAGNOSIS — E7800 Pure hypercholesterolemia, unspecified: Secondary | ICD-10-CM | POA: Diagnosis not present

## 2019-07-27 DIAGNOSIS — J439 Emphysema, unspecified: Secondary | ICD-10-CM | POA: Diagnosis not present

## 2019-07-27 DIAGNOSIS — Z466 Encounter for fitting and adjustment of urinary device: Secondary | ICD-10-CM | POA: Diagnosis not present

## 2019-07-27 DIAGNOSIS — E1122 Type 2 diabetes mellitus with diabetic chronic kidney disease: Secondary | ICD-10-CM | POA: Diagnosis not present

## 2019-07-27 DIAGNOSIS — I130 Hypertensive heart and chronic kidney disease with heart failure and stage 1 through stage 4 chronic kidney disease, or unspecified chronic kidney disease: Secondary | ICD-10-CM | POA: Diagnosis not present

## 2019-07-27 DIAGNOSIS — N139 Obstructive and reflux uropathy, unspecified: Secondary | ICD-10-CM | POA: Diagnosis not present

## 2019-07-27 DIAGNOSIS — Z7401 Bed confinement status: Secondary | ICD-10-CM | POA: Diagnosis not present

## 2019-07-27 DIAGNOSIS — D631 Anemia in chronic kidney disease: Secondary | ICD-10-CM | POA: Diagnosis not present

## 2019-07-27 DIAGNOSIS — I69351 Hemiplegia and hemiparesis following cerebral infarction affecting right dominant side: Secondary | ICD-10-CM | POA: Diagnosis not present

## 2019-07-27 DIAGNOSIS — R1310 Dysphagia, unspecified: Secondary | ICD-10-CM | POA: Diagnosis not present

## 2019-07-27 DIAGNOSIS — M549 Dorsalgia, unspecified: Secondary | ICD-10-CM | POA: Diagnosis not present

## 2019-07-28 DIAGNOSIS — L89313 Pressure ulcer of right buttock, stage 3: Secondary | ICD-10-CM | POA: Diagnosis not present

## 2019-07-28 DIAGNOSIS — L89620 Pressure ulcer of left heel, unstageable: Secondary | ICD-10-CM | POA: Diagnosis not present

## 2019-07-29 DIAGNOSIS — E785 Hyperlipidemia, unspecified: Secondary | ICD-10-CM | POA: Diagnosis not present

## 2019-07-29 DIAGNOSIS — E118 Type 2 diabetes mellitus with unspecified complications: Secondary | ICD-10-CM | POA: Diagnosis not present

## 2019-07-29 DIAGNOSIS — Z960 Presence of urogenital implants: Secondary | ICD-10-CM | POA: Diagnosis not present

## 2019-07-29 DIAGNOSIS — I10 Essential (primary) hypertension: Secondary | ICD-10-CM | POA: Diagnosis not present

## 2019-07-29 DIAGNOSIS — L89159 Pressure ulcer of sacral region, unspecified stage: Secondary | ICD-10-CM | POA: Diagnosis not present

## 2019-08-01 DIAGNOSIS — Z87440 Personal history of urinary (tract) infections: Secondary | ICD-10-CM | POA: Diagnosis not present

## 2019-08-01 DIAGNOSIS — N184 Chronic kidney disease, stage 4 (severe): Secondary | ICD-10-CM | POA: Diagnosis not present

## 2019-08-01 DIAGNOSIS — I69391 Dysphagia following cerebral infarction: Secondary | ICD-10-CM | POA: Diagnosis not present

## 2019-08-01 DIAGNOSIS — M1990 Unspecified osteoarthritis, unspecified site: Secondary | ICD-10-CM | POA: Diagnosis not present

## 2019-08-01 DIAGNOSIS — Z9181 History of falling: Secondary | ICD-10-CM | POA: Diagnosis not present

## 2019-08-01 DIAGNOSIS — Z466 Encounter for fitting and adjustment of urinary device: Secondary | ICD-10-CM | POA: Diagnosis not present

## 2019-08-01 DIAGNOSIS — N139 Obstructive and reflux uropathy, unspecified: Secondary | ICD-10-CM | POA: Diagnosis not present

## 2019-08-01 DIAGNOSIS — G4733 Obstructive sleep apnea (adult) (pediatric): Secondary | ICD-10-CM | POA: Diagnosis not present

## 2019-08-01 DIAGNOSIS — I5032 Chronic diastolic (congestive) heart failure: Secondary | ICD-10-CM | POA: Diagnosis not present

## 2019-08-01 DIAGNOSIS — I252 Old myocardial infarction: Secondary | ICD-10-CM | POA: Diagnosis not present

## 2019-08-01 DIAGNOSIS — I69320 Aphasia following cerebral infarction: Secondary | ICD-10-CM | POA: Diagnosis not present

## 2019-08-01 DIAGNOSIS — E43 Unspecified severe protein-calorie malnutrition: Secondary | ICD-10-CM | POA: Diagnosis not present

## 2019-08-01 DIAGNOSIS — M419 Scoliosis, unspecified: Secondary | ICD-10-CM | POA: Diagnosis not present

## 2019-08-01 DIAGNOSIS — E1122 Type 2 diabetes mellitus with diabetic chronic kidney disease: Secondary | ICD-10-CM | POA: Diagnosis not present

## 2019-08-01 DIAGNOSIS — G8929 Other chronic pain: Secondary | ICD-10-CM | POA: Diagnosis not present

## 2019-08-01 DIAGNOSIS — D631 Anemia in chronic kidney disease: Secondary | ICD-10-CM | POA: Diagnosis not present

## 2019-08-01 DIAGNOSIS — I130 Hypertensive heart and chronic kidney disease with heart failure and stage 1 through stage 4 chronic kidney disease, or unspecified chronic kidney disease: Secondary | ICD-10-CM | POA: Diagnosis not present

## 2019-08-01 DIAGNOSIS — E7800 Pure hypercholesterolemia, unspecified: Secondary | ICD-10-CM | POA: Diagnosis not present

## 2019-08-01 DIAGNOSIS — Z7401 Bed confinement status: Secondary | ICD-10-CM | POA: Diagnosis not present

## 2019-08-01 DIAGNOSIS — J439 Emphysema, unspecified: Secondary | ICD-10-CM | POA: Diagnosis not present

## 2019-08-01 DIAGNOSIS — I2510 Atherosclerotic heart disease of native coronary artery without angina pectoris: Secondary | ICD-10-CM | POA: Diagnosis not present

## 2019-08-01 DIAGNOSIS — Z79899 Other long term (current) drug therapy: Secondary | ICD-10-CM | POA: Diagnosis not present

## 2019-08-01 DIAGNOSIS — M549 Dorsalgia, unspecified: Secondary | ICD-10-CM | POA: Diagnosis not present

## 2019-08-01 DIAGNOSIS — R1310 Dysphagia, unspecified: Secondary | ICD-10-CM | POA: Diagnosis not present

## 2019-08-01 DIAGNOSIS — I69351 Hemiplegia and hemiparesis following cerebral infarction affecting right dominant side: Secondary | ICD-10-CM | POA: Diagnosis not present

## 2019-08-02 DIAGNOSIS — E43 Unspecified severe protein-calorie malnutrition: Secondary | ICD-10-CM | POA: Diagnosis not present

## 2019-08-02 DIAGNOSIS — E1122 Type 2 diabetes mellitus with diabetic chronic kidney disease: Secondary | ICD-10-CM | POA: Diagnosis not present

## 2019-08-02 DIAGNOSIS — D631 Anemia in chronic kidney disease: Secondary | ICD-10-CM | POA: Diagnosis not present

## 2019-08-02 DIAGNOSIS — Z79899 Other long term (current) drug therapy: Secondary | ICD-10-CM | POA: Diagnosis not present

## 2019-08-02 DIAGNOSIS — R1310 Dysphagia, unspecified: Secondary | ICD-10-CM | POA: Diagnosis not present

## 2019-08-02 DIAGNOSIS — Z7401 Bed confinement status: Secondary | ICD-10-CM | POA: Diagnosis not present

## 2019-08-02 DIAGNOSIS — Z466 Encounter for fitting and adjustment of urinary device: Secondary | ICD-10-CM | POA: Diagnosis not present

## 2019-08-02 DIAGNOSIS — E7800 Pure hypercholesterolemia, unspecified: Secondary | ICD-10-CM | POA: Diagnosis not present

## 2019-08-02 DIAGNOSIS — I5032 Chronic diastolic (congestive) heart failure: Secondary | ICD-10-CM | POA: Diagnosis not present

## 2019-08-02 DIAGNOSIS — M419 Scoliosis, unspecified: Secondary | ICD-10-CM | POA: Diagnosis not present

## 2019-08-02 DIAGNOSIS — J439 Emphysema, unspecified: Secondary | ICD-10-CM | POA: Diagnosis not present

## 2019-08-02 DIAGNOSIS — I69320 Aphasia following cerebral infarction: Secondary | ICD-10-CM | POA: Diagnosis not present

## 2019-08-02 DIAGNOSIS — M1990 Unspecified osteoarthritis, unspecified site: Secondary | ICD-10-CM | POA: Diagnosis not present

## 2019-08-02 DIAGNOSIS — I252 Old myocardial infarction: Secondary | ICD-10-CM | POA: Diagnosis not present

## 2019-08-02 DIAGNOSIS — G8929 Other chronic pain: Secondary | ICD-10-CM | POA: Diagnosis not present

## 2019-08-02 DIAGNOSIS — I69351 Hemiplegia and hemiparesis following cerebral infarction affecting right dominant side: Secondary | ICD-10-CM | POA: Diagnosis not present

## 2019-08-02 DIAGNOSIS — Z9181 History of falling: Secondary | ICD-10-CM | POA: Diagnosis not present

## 2019-08-02 DIAGNOSIS — E559 Vitamin D deficiency, unspecified: Secondary | ICD-10-CM | POA: Diagnosis not present

## 2019-08-02 DIAGNOSIS — M549 Dorsalgia, unspecified: Secondary | ICD-10-CM | POA: Diagnosis not present

## 2019-08-02 DIAGNOSIS — I130 Hypertensive heart and chronic kidney disease with heart failure and stage 1 through stage 4 chronic kidney disease, or unspecified chronic kidney disease: Secondary | ICD-10-CM | POA: Diagnosis not present

## 2019-08-02 DIAGNOSIS — N184 Chronic kidney disease, stage 4 (severe): Secondary | ICD-10-CM | POA: Diagnosis not present

## 2019-08-02 DIAGNOSIS — N139 Obstructive and reflux uropathy, unspecified: Secondary | ICD-10-CM | POA: Diagnosis not present

## 2019-08-02 DIAGNOSIS — I69391 Dysphagia following cerebral infarction: Secondary | ICD-10-CM | POA: Diagnosis not present

## 2019-08-02 DIAGNOSIS — Z87440 Personal history of urinary (tract) infections: Secondary | ICD-10-CM | POA: Diagnosis not present

## 2019-08-02 DIAGNOSIS — I2510 Atherosclerotic heart disease of native coronary artery without angina pectoris: Secondary | ICD-10-CM | POA: Diagnosis not present

## 2019-08-02 DIAGNOSIS — G4733 Obstructive sleep apnea (adult) (pediatric): Secondary | ICD-10-CM | POA: Diagnosis not present

## 2019-08-02 DIAGNOSIS — L89313 Pressure ulcer of right buttock, stage 3: Secondary | ICD-10-CM | POA: Diagnosis not present

## 2019-08-02 DIAGNOSIS — Z7689 Persons encountering health services in other specified circumstances: Secondary | ICD-10-CM | POA: Diagnosis not present

## 2019-08-04 DIAGNOSIS — M549 Dorsalgia, unspecified: Secondary | ICD-10-CM | POA: Diagnosis not present

## 2019-08-04 DIAGNOSIS — I252 Old myocardial infarction: Secondary | ICD-10-CM | POA: Diagnosis not present

## 2019-08-04 DIAGNOSIS — Z466 Encounter for fitting and adjustment of urinary device: Secondary | ICD-10-CM | POA: Diagnosis not present

## 2019-08-04 DIAGNOSIS — I130 Hypertensive heart and chronic kidney disease with heart failure and stage 1 through stage 4 chronic kidney disease, or unspecified chronic kidney disease: Secondary | ICD-10-CM | POA: Diagnosis not present

## 2019-08-04 DIAGNOSIS — Z9181 History of falling: Secondary | ICD-10-CM | POA: Diagnosis not present

## 2019-08-04 DIAGNOSIS — E43 Unspecified severe protein-calorie malnutrition: Secondary | ICD-10-CM | POA: Diagnosis not present

## 2019-08-04 DIAGNOSIS — N139 Obstructive and reflux uropathy, unspecified: Secondary | ICD-10-CM | POA: Diagnosis not present

## 2019-08-04 DIAGNOSIS — I2510 Atherosclerotic heart disease of native coronary artery without angina pectoris: Secondary | ICD-10-CM | POA: Diagnosis not present

## 2019-08-04 DIAGNOSIS — G4733 Obstructive sleep apnea (adult) (pediatric): Secondary | ICD-10-CM | POA: Diagnosis not present

## 2019-08-04 DIAGNOSIS — Z87440 Personal history of urinary (tract) infections: Secondary | ICD-10-CM | POA: Diagnosis not present

## 2019-08-04 DIAGNOSIS — G8111 Spastic hemiplegia affecting right dominant side: Secondary | ICD-10-CM | POA: Diagnosis not present

## 2019-08-04 DIAGNOSIS — E1122 Type 2 diabetes mellitus with diabetic chronic kidney disease: Secondary | ICD-10-CM | POA: Diagnosis not present

## 2019-08-04 DIAGNOSIS — I69391 Dysphagia following cerebral infarction: Secondary | ICD-10-CM | POA: Diagnosis not present

## 2019-08-04 DIAGNOSIS — I639 Cerebral infarction, unspecified: Secondary | ICD-10-CM | POA: Diagnosis not present

## 2019-08-04 DIAGNOSIS — I69351 Hemiplegia and hemiparesis following cerebral infarction affecting right dominant side: Secondary | ICD-10-CM | POA: Diagnosis not present

## 2019-08-04 DIAGNOSIS — Z79899 Other long term (current) drug therapy: Secondary | ICD-10-CM | POA: Diagnosis not present

## 2019-08-04 DIAGNOSIS — M419 Scoliosis, unspecified: Secondary | ICD-10-CM | POA: Diagnosis not present

## 2019-08-04 DIAGNOSIS — E7800 Pure hypercholesterolemia, unspecified: Secondary | ICD-10-CM | POA: Diagnosis not present

## 2019-08-04 DIAGNOSIS — J439 Emphysema, unspecified: Secondary | ICD-10-CM | POA: Diagnosis not present

## 2019-08-04 DIAGNOSIS — I69320 Aphasia following cerebral infarction: Secondary | ICD-10-CM | POA: Diagnosis not present

## 2019-08-04 DIAGNOSIS — Z7401 Bed confinement status: Secondary | ICD-10-CM | POA: Diagnosis not present

## 2019-08-04 DIAGNOSIS — D631 Anemia in chronic kidney disease: Secondary | ICD-10-CM | POA: Diagnosis not present

## 2019-08-04 DIAGNOSIS — M1990 Unspecified osteoarthritis, unspecified site: Secondary | ICD-10-CM | POA: Diagnosis not present

## 2019-08-04 DIAGNOSIS — I5032 Chronic diastolic (congestive) heart failure: Secondary | ICD-10-CM | POA: Diagnosis not present

## 2019-08-04 DIAGNOSIS — E119 Type 2 diabetes mellitus without complications: Secondary | ICD-10-CM | POA: Diagnosis not present

## 2019-08-04 DIAGNOSIS — N184 Chronic kidney disease, stage 4 (severe): Secondary | ICD-10-CM | POA: Diagnosis not present

## 2019-08-04 DIAGNOSIS — G8929 Other chronic pain: Secondary | ICD-10-CM | POA: Diagnosis not present

## 2019-08-04 DIAGNOSIS — R1310 Dysphagia, unspecified: Secondary | ICD-10-CM | POA: Diagnosis not present

## 2019-08-07 ENCOUNTER — Encounter (HOSPITAL_BASED_OUTPATIENT_CLINIC_OR_DEPARTMENT_OTHER): Payer: Medicare Other | Admitting: Internal Medicine

## 2019-08-08 DIAGNOSIS — E1122 Type 2 diabetes mellitus with diabetic chronic kidney disease: Secondary | ICD-10-CM | POA: Diagnosis not present

## 2019-08-08 DIAGNOSIS — I69320 Aphasia following cerebral infarction: Secondary | ICD-10-CM | POA: Diagnosis not present

## 2019-08-08 DIAGNOSIS — Z9181 History of falling: Secondary | ICD-10-CM | POA: Diagnosis not present

## 2019-08-08 DIAGNOSIS — G8929 Other chronic pain: Secondary | ICD-10-CM | POA: Diagnosis not present

## 2019-08-08 DIAGNOSIS — I252 Old myocardial infarction: Secondary | ICD-10-CM | POA: Diagnosis not present

## 2019-08-08 DIAGNOSIS — Z466 Encounter for fitting and adjustment of urinary device: Secondary | ICD-10-CM | POA: Diagnosis not present

## 2019-08-08 DIAGNOSIS — D631 Anemia in chronic kidney disease: Secondary | ICD-10-CM | POA: Diagnosis not present

## 2019-08-08 DIAGNOSIS — M1990 Unspecified osteoarthritis, unspecified site: Secondary | ICD-10-CM | POA: Diagnosis not present

## 2019-08-08 DIAGNOSIS — I69391 Dysphagia following cerebral infarction: Secondary | ICD-10-CM | POA: Diagnosis not present

## 2019-08-08 DIAGNOSIS — I2510 Atherosclerotic heart disease of native coronary artery without angina pectoris: Secondary | ICD-10-CM | POA: Diagnosis not present

## 2019-08-08 DIAGNOSIS — E7800 Pure hypercholesterolemia, unspecified: Secondary | ICD-10-CM | POA: Diagnosis not present

## 2019-08-08 DIAGNOSIS — Z79899 Other long term (current) drug therapy: Secondary | ICD-10-CM | POA: Diagnosis not present

## 2019-08-08 DIAGNOSIS — R1310 Dysphagia, unspecified: Secondary | ICD-10-CM | POA: Diagnosis not present

## 2019-08-08 DIAGNOSIS — I69351 Hemiplegia and hemiparesis following cerebral infarction affecting right dominant side: Secondary | ICD-10-CM | POA: Diagnosis not present

## 2019-08-08 DIAGNOSIS — G4733 Obstructive sleep apnea (adult) (pediatric): Secondary | ICD-10-CM | POA: Diagnosis not present

## 2019-08-08 DIAGNOSIS — N139 Obstructive and reflux uropathy, unspecified: Secondary | ICD-10-CM | POA: Diagnosis not present

## 2019-08-08 DIAGNOSIS — M419 Scoliosis, unspecified: Secondary | ICD-10-CM | POA: Diagnosis not present

## 2019-08-08 DIAGNOSIS — Z87440 Personal history of urinary (tract) infections: Secondary | ICD-10-CM | POA: Diagnosis not present

## 2019-08-08 DIAGNOSIS — I130 Hypertensive heart and chronic kidney disease with heart failure and stage 1 through stage 4 chronic kidney disease, or unspecified chronic kidney disease: Secondary | ICD-10-CM | POA: Diagnosis not present

## 2019-08-08 DIAGNOSIS — Z7401 Bed confinement status: Secondary | ICD-10-CM | POA: Diagnosis not present

## 2019-08-08 DIAGNOSIS — N184 Chronic kidney disease, stage 4 (severe): Secondary | ICD-10-CM | POA: Diagnosis not present

## 2019-08-08 DIAGNOSIS — E43 Unspecified severe protein-calorie malnutrition: Secondary | ICD-10-CM | POA: Diagnosis not present

## 2019-08-08 DIAGNOSIS — M549 Dorsalgia, unspecified: Secondary | ICD-10-CM | POA: Diagnosis not present

## 2019-08-08 DIAGNOSIS — I5032 Chronic diastolic (congestive) heart failure: Secondary | ICD-10-CM | POA: Diagnosis not present

## 2019-08-08 DIAGNOSIS — J439 Emphysema, unspecified: Secondary | ICD-10-CM | POA: Diagnosis not present

## 2019-08-10 DIAGNOSIS — E7800 Pure hypercholesterolemia, unspecified: Secondary | ICD-10-CM | POA: Diagnosis not present

## 2019-08-10 DIAGNOSIS — R1310 Dysphagia, unspecified: Secondary | ICD-10-CM | POA: Diagnosis not present

## 2019-08-10 DIAGNOSIS — N184 Chronic kidney disease, stage 4 (severe): Secondary | ICD-10-CM | POA: Diagnosis not present

## 2019-08-10 DIAGNOSIS — Z9181 History of falling: Secondary | ICD-10-CM | POA: Diagnosis not present

## 2019-08-10 DIAGNOSIS — Z466 Encounter for fitting and adjustment of urinary device: Secondary | ICD-10-CM | POA: Diagnosis not present

## 2019-08-10 DIAGNOSIS — E1122 Type 2 diabetes mellitus with diabetic chronic kidney disease: Secondary | ICD-10-CM | POA: Diagnosis not present

## 2019-08-10 DIAGNOSIS — D631 Anemia in chronic kidney disease: Secondary | ICD-10-CM | POA: Diagnosis not present

## 2019-08-10 DIAGNOSIS — Z7401 Bed confinement status: Secondary | ICD-10-CM | POA: Diagnosis not present

## 2019-08-10 DIAGNOSIS — I5032 Chronic diastolic (congestive) heart failure: Secondary | ICD-10-CM | POA: Diagnosis not present

## 2019-08-10 DIAGNOSIS — N139 Obstructive and reflux uropathy, unspecified: Secondary | ICD-10-CM | POA: Diagnosis not present

## 2019-08-10 DIAGNOSIS — M1990 Unspecified osteoarthritis, unspecified site: Secondary | ICD-10-CM | POA: Diagnosis not present

## 2019-08-10 DIAGNOSIS — Z79899 Other long term (current) drug therapy: Secondary | ICD-10-CM | POA: Diagnosis not present

## 2019-08-10 DIAGNOSIS — G4733 Obstructive sleep apnea (adult) (pediatric): Secondary | ICD-10-CM | POA: Diagnosis not present

## 2019-08-10 DIAGNOSIS — I252 Old myocardial infarction: Secondary | ICD-10-CM | POA: Diagnosis not present

## 2019-08-10 DIAGNOSIS — I69351 Hemiplegia and hemiparesis following cerebral infarction affecting right dominant side: Secondary | ICD-10-CM | POA: Diagnosis not present

## 2019-08-10 DIAGNOSIS — E43 Unspecified severe protein-calorie malnutrition: Secondary | ICD-10-CM | POA: Diagnosis not present

## 2019-08-10 DIAGNOSIS — I2510 Atherosclerotic heart disease of native coronary artery without angina pectoris: Secondary | ICD-10-CM | POA: Diagnosis not present

## 2019-08-10 DIAGNOSIS — I69320 Aphasia following cerebral infarction: Secondary | ICD-10-CM | POA: Diagnosis not present

## 2019-08-10 DIAGNOSIS — Z87440 Personal history of urinary (tract) infections: Secondary | ICD-10-CM | POA: Diagnosis not present

## 2019-08-10 DIAGNOSIS — J439 Emphysema, unspecified: Secondary | ICD-10-CM | POA: Diagnosis not present

## 2019-08-10 DIAGNOSIS — G8929 Other chronic pain: Secondary | ICD-10-CM | POA: Diagnosis not present

## 2019-08-10 DIAGNOSIS — M549 Dorsalgia, unspecified: Secondary | ICD-10-CM | POA: Diagnosis not present

## 2019-08-10 DIAGNOSIS — I130 Hypertensive heart and chronic kidney disease with heart failure and stage 1 through stage 4 chronic kidney disease, or unspecified chronic kidney disease: Secondary | ICD-10-CM | POA: Diagnosis not present

## 2019-08-10 DIAGNOSIS — I69391 Dysphagia following cerebral infarction: Secondary | ICD-10-CM | POA: Diagnosis not present

## 2019-08-10 DIAGNOSIS — M419 Scoliosis, unspecified: Secondary | ICD-10-CM | POA: Diagnosis not present

## 2019-08-11 DIAGNOSIS — D631 Anemia in chronic kidney disease: Secondary | ICD-10-CM | POA: Diagnosis not present

## 2019-08-11 DIAGNOSIS — Z9181 History of falling: Secondary | ICD-10-CM | POA: Diagnosis not present

## 2019-08-11 DIAGNOSIS — N139 Obstructive and reflux uropathy, unspecified: Secondary | ICD-10-CM | POA: Diagnosis not present

## 2019-08-11 DIAGNOSIS — Z466 Encounter for fitting and adjustment of urinary device: Secondary | ICD-10-CM | POA: Diagnosis not present

## 2019-08-11 DIAGNOSIS — I2510 Atherosclerotic heart disease of native coronary artery without angina pectoris: Secondary | ICD-10-CM | POA: Diagnosis not present

## 2019-08-11 DIAGNOSIS — Z79899 Other long term (current) drug therapy: Secondary | ICD-10-CM | POA: Diagnosis not present

## 2019-08-11 DIAGNOSIS — G8929 Other chronic pain: Secondary | ICD-10-CM | POA: Diagnosis not present

## 2019-08-11 DIAGNOSIS — R1310 Dysphagia, unspecified: Secondary | ICD-10-CM | POA: Diagnosis not present

## 2019-08-11 DIAGNOSIS — M1990 Unspecified osteoarthritis, unspecified site: Secondary | ICD-10-CM | POA: Diagnosis not present

## 2019-08-11 DIAGNOSIS — I69351 Hemiplegia and hemiparesis following cerebral infarction affecting right dominant side: Secondary | ICD-10-CM | POA: Diagnosis not present

## 2019-08-11 DIAGNOSIS — M549 Dorsalgia, unspecified: Secondary | ICD-10-CM | POA: Diagnosis not present

## 2019-08-11 DIAGNOSIS — E7800 Pure hypercholesterolemia, unspecified: Secondary | ICD-10-CM | POA: Diagnosis not present

## 2019-08-11 DIAGNOSIS — E43 Unspecified severe protein-calorie malnutrition: Secondary | ICD-10-CM | POA: Diagnosis not present

## 2019-08-11 DIAGNOSIS — I5032 Chronic diastolic (congestive) heart failure: Secondary | ICD-10-CM | POA: Diagnosis not present

## 2019-08-11 DIAGNOSIS — E1122 Type 2 diabetes mellitus with diabetic chronic kidney disease: Secondary | ICD-10-CM | POA: Diagnosis not present

## 2019-08-11 DIAGNOSIS — I252 Old myocardial infarction: Secondary | ICD-10-CM | POA: Diagnosis not present

## 2019-08-11 DIAGNOSIS — N184 Chronic kidney disease, stage 4 (severe): Secondary | ICD-10-CM | POA: Diagnosis not present

## 2019-08-11 DIAGNOSIS — I69320 Aphasia following cerebral infarction: Secondary | ICD-10-CM | POA: Diagnosis not present

## 2019-08-11 DIAGNOSIS — G4733 Obstructive sleep apnea (adult) (pediatric): Secondary | ICD-10-CM | POA: Diagnosis not present

## 2019-08-11 DIAGNOSIS — J439 Emphysema, unspecified: Secondary | ICD-10-CM | POA: Diagnosis not present

## 2019-08-11 DIAGNOSIS — I69391 Dysphagia following cerebral infarction: Secondary | ICD-10-CM | POA: Diagnosis not present

## 2019-08-11 DIAGNOSIS — Z7401 Bed confinement status: Secondary | ICD-10-CM | POA: Diagnosis not present

## 2019-08-11 DIAGNOSIS — Z87440 Personal history of urinary (tract) infections: Secondary | ICD-10-CM | POA: Diagnosis not present

## 2019-08-11 DIAGNOSIS — I130 Hypertensive heart and chronic kidney disease with heart failure and stage 1 through stage 4 chronic kidney disease, or unspecified chronic kidney disease: Secondary | ICD-10-CM | POA: Diagnosis not present

## 2019-08-11 DIAGNOSIS — M419 Scoliosis, unspecified: Secondary | ICD-10-CM | POA: Diagnosis not present

## 2019-08-15 DIAGNOSIS — G4733 Obstructive sleep apnea (adult) (pediatric): Secondary | ICD-10-CM | POA: Diagnosis not present

## 2019-08-15 DIAGNOSIS — I252 Old myocardial infarction: Secondary | ICD-10-CM | POA: Diagnosis not present

## 2019-08-15 DIAGNOSIS — Z9181 History of falling: Secondary | ICD-10-CM | POA: Diagnosis not present

## 2019-08-15 DIAGNOSIS — I69351 Hemiplegia and hemiparesis following cerebral infarction affecting right dominant side: Secondary | ICD-10-CM | POA: Diagnosis not present

## 2019-08-15 DIAGNOSIS — Z466 Encounter for fitting and adjustment of urinary device: Secondary | ICD-10-CM | POA: Diagnosis not present

## 2019-08-15 DIAGNOSIS — Z87440 Personal history of urinary (tract) infections: Secondary | ICD-10-CM | POA: Diagnosis not present

## 2019-08-15 DIAGNOSIS — R1310 Dysphagia, unspecified: Secondary | ICD-10-CM | POA: Diagnosis not present

## 2019-08-15 DIAGNOSIS — E7800 Pure hypercholesterolemia, unspecified: Secondary | ICD-10-CM | POA: Diagnosis not present

## 2019-08-15 DIAGNOSIS — N139 Obstructive and reflux uropathy, unspecified: Secondary | ICD-10-CM | POA: Diagnosis not present

## 2019-08-15 DIAGNOSIS — G8111 Spastic hemiplegia affecting right dominant side: Secondary | ICD-10-CM | POA: Diagnosis not present

## 2019-08-15 DIAGNOSIS — M419 Scoliosis, unspecified: Secondary | ICD-10-CM | POA: Diagnosis not present

## 2019-08-15 DIAGNOSIS — J439 Emphysema, unspecified: Secondary | ICD-10-CM | POA: Diagnosis not present

## 2019-08-15 DIAGNOSIS — M1990 Unspecified osteoarthritis, unspecified site: Secondary | ICD-10-CM | POA: Diagnosis not present

## 2019-08-15 DIAGNOSIS — D631 Anemia in chronic kidney disease: Secondary | ICD-10-CM | POA: Diagnosis not present

## 2019-08-15 DIAGNOSIS — I5032 Chronic diastolic (congestive) heart failure: Secondary | ICD-10-CM | POA: Diagnosis not present

## 2019-08-15 DIAGNOSIS — I130 Hypertensive heart and chronic kidney disease with heart failure and stage 1 through stage 4 chronic kidney disease, or unspecified chronic kidney disease: Secondary | ICD-10-CM | POA: Diagnosis not present

## 2019-08-15 DIAGNOSIS — N184 Chronic kidney disease, stage 4 (severe): Secondary | ICD-10-CM | POA: Diagnosis not present

## 2019-08-15 DIAGNOSIS — E1122 Type 2 diabetes mellitus with diabetic chronic kidney disease: Secondary | ICD-10-CM | POA: Diagnosis not present

## 2019-08-15 DIAGNOSIS — I2510 Atherosclerotic heart disease of native coronary artery without angina pectoris: Secondary | ICD-10-CM | POA: Diagnosis not present

## 2019-08-15 DIAGNOSIS — I69391 Dysphagia following cerebral infarction: Secondary | ICD-10-CM | POA: Diagnosis not present

## 2019-08-15 DIAGNOSIS — M150 Primary generalized (osteo)arthritis: Secondary | ICD-10-CM | POA: Diagnosis not present

## 2019-08-15 DIAGNOSIS — I639 Cerebral infarction, unspecified: Secondary | ICD-10-CM | POA: Diagnosis not present

## 2019-08-15 DIAGNOSIS — G8929 Other chronic pain: Secondary | ICD-10-CM | POA: Diagnosis not present

## 2019-08-15 DIAGNOSIS — M549 Dorsalgia, unspecified: Secondary | ICD-10-CM | POA: Diagnosis not present

## 2019-08-15 DIAGNOSIS — Z79899 Other long term (current) drug therapy: Secondary | ICD-10-CM | POA: Diagnosis not present

## 2019-08-15 DIAGNOSIS — Z7401 Bed confinement status: Secondary | ICD-10-CM | POA: Diagnosis not present

## 2019-08-15 DIAGNOSIS — E43 Unspecified severe protein-calorie malnutrition: Secondary | ICD-10-CM | POA: Diagnosis not present

## 2019-08-15 DIAGNOSIS — I69320 Aphasia following cerebral infarction: Secondary | ICD-10-CM | POA: Diagnosis not present

## 2019-08-18 DIAGNOSIS — G8929 Other chronic pain: Secondary | ICD-10-CM | POA: Diagnosis not present

## 2019-08-18 DIAGNOSIS — I2510 Atherosclerotic heart disease of native coronary artery without angina pectoris: Secondary | ICD-10-CM | POA: Diagnosis not present

## 2019-08-18 DIAGNOSIS — G4733 Obstructive sleep apnea (adult) (pediatric): Secondary | ICD-10-CM | POA: Diagnosis not present

## 2019-08-18 DIAGNOSIS — J439 Emphysema, unspecified: Secondary | ICD-10-CM | POA: Diagnosis not present

## 2019-08-18 DIAGNOSIS — Z9181 History of falling: Secondary | ICD-10-CM | POA: Diagnosis not present

## 2019-08-18 DIAGNOSIS — M419 Scoliosis, unspecified: Secondary | ICD-10-CM | POA: Diagnosis not present

## 2019-08-18 DIAGNOSIS — N184 Chronic kidney disease, stage 4 (severe): Secondary | ICD-10-CM | POA: Diagnosis not present

## 2019-08-18 DIAGNOSIS — Z79899 Other long term (current) drug therapy: Secondary | ICD-10-CM | POA: Diagnosis not present

## 2019-08-18 DIAGNOSIS — M549 Dorsalgia, unspecified: Secondary | ICD-10-CM | POA: Diagnosis not present

## 2019-08-18 DIAGNOSIS — I69391 Dysphagia following cerebral infarction: Secondary | ICD-10-CM | POA: Diagnosis not present

## 2019-08-18 DIAGNOSIS — I5032 Chronic diastolic (congestive) heart failure: Secondary | ICD-10-CM | POA: Diagnosis not present

## 2019-08-18 DIAGNOSIS — E1122 Type 2 diabetes mellitus with diabetic chronic kidney disease: Secondary | ICD-10-CM | POA: Diagnosis not present

## 2019-08-18 DIAGNOSIS — Z466 Encounter for fitting and adjustment of urinary device: Secondary | ICD-10-CM | POA: Diagnosis not present

## 2019-08-18 DIAGNOSIS — I69320 Aphasia following cerebral infarction: Secondary | ICD-10-CM | POA: Diagnosis not present

## 2019-08-18 DIAGNOSIS — N139 Obstructive and reflux uropathy, unspecified: Secondary | ICD-10-CM | POA: Diagnosis not present

## 2019-08-18 DIAGNOSIS — E7800 Pure hypercholesterolemia, unspecified: Secondary | ICD-10-CM | POA: Diagnosis not present

## 2019-08-18 DIAGNOSIS — I130 Hypertensive heart and chronic kidney disease with heart failure and stage 1 through stage 4 chronic kidney disease, or unspecified chronic kidney disease: Secondary | ICD-10-CM | POA: Diagnosis not present

## 2019-08-18 DIAGNOSIS — Z87440 Personal history of urinary (tract) infections: Secondary | ICD-10-CM | POA: Diagnosis not present

## 2019-08-18 DIAGNOSIS — E43 Unspecified severe protein-calorie malnutrition: Secondary | ICD-10-CM | POA: Diagnosis not present

## 2019-08-18 DIAGNOSIS — R1310 Dysphagia, unspecified: Secondary | ICD-10-CM | POA: Diagnosis not present

## 2019-08-18 DIAGNOSIS — M1990 Unspecified osteoarthritis, unspecified site: Secondary | ICD-10-CM | POA: Diagnosis not present

## 2019-08-18 DIAGNOSIS — I69351 Hemiplegia and hemiparesis following cerebral infarction affecting right dominant side: Secondary | ICD-10-CM | POA: Diagnosis not present

## 2019-08-18 DIAGNOSIS — I252 Old myocardial infarction: Secondary | ICD-10-CM | POA: Diagnosis not present

## 2019-08-18 DIAGNOSIS — Z7401 Bed confinement status: Secondary | ICD-10-CM | POA: Diagnosis not present

## 2019-08-18 DIAGNOSIS — D631 Anemia in chronic kidney disease: Secondary | ICD-10-CM | POA: Diagnosis not present

## 2019-08-22 DIAGNOSIS — Z9181 History of falling: Secondary | ICD-10-CM | POA: Diagnosis not present

## 2019-08-22 DIAGNOSIS — G4733 Obstructive sleep apnea (adult) (pediatric): Secondary | ICD-10-CM | POA: Diagnosis not present

## 2019-08-22 DIAGNOSIS — G8929 Other chronic pain: Secondary | ICD-10-CM | POA: Diagnosis not present

## 2019-08-22 DIAGNOSIS — R1310 Dysphagia, unspecified: Secondary | ICD-10-CM | POA: Diagnosis not present

## 2019-08-22 DIAGNOSIS — I130 Hypertensive heart and chronic kidney disease with heart failure and stage 1 through stage 4 chronic kidney disease, or unspecified chronic kidney disease: Secondary | ICD-10-CM | POA: Diagnosis not present

## 2019-08-22 DIAGNOSIS — J439 Emphysema, unspecified: Secondary | ICD-10-CM | POA: Diagnosis not present

## 2019-08-22 DIAGNOSIS — I69391 Dysphagia following cerebral infarction: Secondary | ICD-10-CM | POA: Diagnosis not present

## 2019-08-22 DIAGNOSIS — L89313 Pressure ulcer of right buttock, stage 3: Secondary | ICD-10-CM | POA: Diagnosis not present

## 2019-08-22 DIAGNOSIS — I252 Old myocardial infarction: Secondary | ICD-10-CM | POA: Diagnosis not present

## 2019-08-22 DIAGNOSIS — N184 Chronic kidney disease, stage 4 (severe): Secondary | ICD-10-CM | POA: Diagnosis not present

## 2019-08-22 DIAGNOSIS — N139 Obstructive and reflux uropathy, unspecified: Secondary | ICD-10-CM | POA: Diagnosis not present

## 2019-08-22 DIAGNOSIS — M1990 Unspecified osteoarthritis, unspecified site: Secondary | ICD-10-CM | POA: Diagnosis not present

## 2019-08-22 DIAGNOSIS — Z87440 Personal history of urinary (tract) infections: Secondary | ICD-10-CM | POA: Diagnosis not present

## 2019-08-22 DIAGNOSIS — E7800 Pure hypercholesterolemia, unspecified: Secondary | ICD-10-CM | POA: Diagnosis not present

## 2019-08-22 DIAGNOSIS — Z993 Dependence on wheelchair: Secondary | ICD-10-CM | POA: Diagnosis not present

## 2019-08-22 DIAGNOSIS — D631 Anemia in chronic kidney disease: Secondary | ICD-10-CM | POA: Diagnosis not present

## 2019-08-22 DIAGNOSIS — I69320 Aphasia following cerebral infarction: Secondary | ICD-10-CM | POA: Diagnosis not present

## 2019-08-22 DIAGNOSIS — E1122 Type 2 diabetes mellitus with diabetic chronic kidney disease: Secondary | ICD-10-CM | POA: Diagnosis not present

## 2019-08-22 DIAGNOSIS — Z466 Encounter for fitting and adjustment of urinary device: Secondary | ICD-10-CM | POA: Diagnosis not present

## 2019-08-22 DIAGNOSIS — I5032 Chronic diastolic (congestive) heart failure: Secondary | ICD-10-CM | POA: Diagnosis not present

## 2019-08-22 DIAGNOSIS — E43 Unspecified severe protein-calorie malnutrition: Secondary | ICD-10-CM | POA: Diagnosis not present

## 2019-08-22 DIAGNOSIS — M419 Scoliosis, unspecified: Secondary | ICD-10-CM | POA: Diagnosis not present

## 2019-08-22 DIAGNOSIS — I2510 Atherosclerotic heart disease of native coronary artery without angina pectoris: Secondary | ICD-10-CM | POA: Diagnosis not present

## 2019-08-22 DIAGNOSIS — I69351 Hemiplegia and hemiparesis following cerebral infarction affecting right dominant side: Secondary | ICD-10-CM | POA: Diagnosis not present

## 2019-08-22 DIAGNOSIS — Z79899 Other long term (current) drug therapy: Secondary | ICD-10-CM | POA: Diagnosis not present

## 2019-08-23 DIAGNOSIS — N319 Neuromuscular dysfunction of bladder, unspecified: Secondary | ICD-10-CM | POA: Diagnosis not present

## 2019-08-23 DIAGNOSIS — L89313 Pressure ulcer of right buttock, stage 3: Secondary | ICD-10-CM | POA: Diagnosis not present

## 2019-08-23 DIAGNOSIS — Z466 Encounter for fitting and adjustment of urinary device: Secondary | ICD-10-CM | POA: Diagnosis not present

## 2019-08-23 DIAGNOSIS — R339 Retention of urine, unspecified: Secondary | ICD-10-CM | POA: Diagnosis not present

## 2019-08-24 DIAGNOSIS — G8929 Other chronic pain: Secondary | ICD-10-CM | POA: Diagnosis not present

## 2019-08-24 DIAGNOSIS — M419 Scoliosis, unspecified: Secondary | ICD-10-CM | POA: Diagnosis not present

## 2019-08-24 DIAGNOSIS — E7800 Pure hypercholesterolemia, unspecified: Secondary | ICD-10-CM | POA: Diagnosis not present

## 2019-08-24 DIAGNOSIS — Z466 Encounter for fitting and adjustment of urinary device: Secondary | ICD-10-CM | POA: Diagnosis not present

## 2019-08-24 DIAGNOSIS — N139 Obstructive and reflux uropathy, unspecified: Secondary | ICD-10-CM | POA: Diagnosis not present

## 2019-08-24 DIAGNOSIS — I130 Hypertensive heart and chronic kidney disease with heart failure and stage 1 through stage 4 chronic kidney disease, or unspecified chronic kidney disease: Secondary | ICD-10-CM | POA: Diagnosis not present

## 2019-08-24 DIAGNOSIS — Z87440 Personal history of urinary (tract) infections: Secondary | ICD-10-CM | POA: Diagnosis not present

## 2019-08-24 DIAGNOSIS — I252 Old myocardial infarction: Secondary | ICD-10-CM | POA: Diagnosis not present

## 2019-08-24 DIAGNOSIS — E1122 Type 2 diabetes mellitus with diabetic chronic kidney disease: Secondary | ICD-10-CM | POA: Diagnosis not present

## 2019-08-24 DIAGNOSIS — L89313 Pressure ulcer of right buttock, stage 3: Secondary | ICD-10-CM | POA: Diagnosis not present

## 2019-08-24 DIAGNOSIS — E43 Unspecified severe protein-calorie malnutrition: Secondary | ICD-10-CM | POA: Diagnosis not present

## 2019-08-24 DIAGNOSIS — M1990 Unspecified osteoarthritis, unspecified site: Secondary | ICD-10-CM | POA: Diagnosis not present

## 2019-08-24 DIAGNOSIS — Z9181 History of falling: Secondary | ICD-10-CM | POA: Diagnosis not present

## 2019-08-24 DIAGNOSIS — J439 Emphysema, unspecified: Secondary | ICD-10-CM | POA: Diagnosis not present

## 2019-08-24 DIAGNOSIS — I69351 Hemiplegia and hemiparesis following cerebral infarction affecting right dominant side: Secondary | ICD-10-CM | POA: Diagnosis not present

## 2019-08-24 DIAGNOSIS — D631 Anemia in chronic kidney disease: Secondary | ICD-10-CM | POA: Diagnosis not present

## 2019-08-24 DIAGNOSIS — I2510 Atherosclerotic heart disease of native coronary artery without angina pectoris: Secondary | ICD-10-CM | POA: Diagnosis not present

## 2019-08-24 DIAGNOSIS — Z79899 Other long term (current) drug therapy: Secondary | ICD-10-CM | POA: Diagnosis not present

## 2019-08-24 DIAGNOSIS — I69320 Aphasia following cerebral infarction: Secondary | ICD-10-CM | POA: Diagnosis not present

## 2019-08-24 DIAGNOSIS — I5032 Chronic diastolic (congestive) heart failure: Secondary | ICD-10-CM | POA: Diagnosis not present

## 2019-08-24 DIAGNOSIS — N184 Chronic kidney disease, stage 4 (severe): Secondary | ICD-10-CM | POA: Diagnosis not present

## 2019-08-24 DIAGNOSIS — Z993 Dependence on wheelchair: Secondary | ICD-10-CM | POA: Diagnosis not present

## 2019-08-24 DIAGNOSIS — G4733 Obstructive sleep apnea (adult) (pediatric): Secondary | ICD-10-CM | POA: Diagnosis not present

## 2019-08-24 DIAGNOSIS — I69391 Dysphagia following cerebral infarction: Secondary | ICD-10-CM | POA: Diagnosis not present

## 2019-08-24 DIAGNOSIS — R1310 Dysphagia, unspecified: Secondary | ICD-10-CM | POA: Diagnosis not present

## 2019-08-28 DIAGNOSIS — Z466 Encounter for fitting and adjustment of urinary device: Secondary | ICD-10-CM | POA: Diagnosis not present

## 2019-08-28 DIAGNOSIS — R339 Retention of urine, unspecified: Secondary | ICD-10-CM | POA: Diagnosis not present

## 2019-08-28 DIAGNOSIS — N319 Neuromuscular dysfunction of bladder, unspecified: Secondary | ICD-10-CM | POA: Diagnosis not present

## 2019-08-29 DIAGNOSIS — I5032 Chronic diastolic (congestive) heart failure: Secondary | ICD-10-CM | POA: Diagnosis not present

## 2019-08-29 DIAGNOSIS — I252 Old myocardial infarction: Secondary | ICD-10-CM | POA: Diagnosis not present

## 2019-08-29 DIAGNOSIS — M419 Scoliosis, unspecified: Secondary | ICD-10-CM | POA: Diagnosis not present

## 2019-08-29 DIAGNOSIS — E119 Type 2 diabetes mellitus without complications: Secondary | ICD-10-CM | POA: Diagnosis not present

## 2019-08-29 DIAGNOSIS — N184 Chronic kidney disease, stage 4 (severe): Secondary | ICD-10-CM | POA: Diagnosis not present

## 2019-08-29 DIAGNOSIS — E43 Unspecified severe protein-calorie malnutrition: Secondary | ICD-10-CM | POA: Diagnosis not present

## 2019-08-29 DIAGNOSIS — I2510 Atherosclerotic heart disease of native coronary artery without angina pectoris: Secondary | ICD-10-CM | POA: Diagnosis not present

## 2019-08-29 DIAGNOSIS — Z87440 Personal history of urinary (tract) infections: Secondary | ICD-10-CM | POA: Diagnosis not present

## 2019-08-29 DIAGNOSIS — E7800 Pure hypercholesterolemia, unspecified: Secondary | ICD-10-CM | POA: Diagnosis not present

## 2019-08-29 DIAGNOSIS — Z993 Dependence on wheelchair: Secondary | ICD-10-CM | POA: Diagnosis not present

## 2019-08-29 DIAGNOSIS — Z9181 History of falling: Secondary | ICD-10-CM | POA: Diagnosis not present

## 2019-08-29 DIAGNOSIS — E1122 Type 2 diabetes mellitus with diabetic chronic kidney disease: Secondary | ICD-10-CM | POA: Diagnosis not present

## 2019-08-29 DIAGNOSIS — M1990 Unspecified osteoarthritis, unspecified site: Secondary | ICD-10-CM | POA: Diagnosis not present

## 2019-08-29 DIAGNOSIS — D631 Anemia in chronic kidney disease: Secondary | ICD-10-CM | POA: Diagnosis not present

## 2019-08-29 DIAGNOSIS — I69351 Hemiplegia and hemiparesis following cerebral infarction affecting right dominant side: Secondary | ICD-10-CM | POA: Diagnosis not present

## 2019-08-29 DIAGNOSIS — Z466 Encounter for fitting and adjustment of urinary device: Secondary | ICD-10-CM | POA: Diagnosis not present

## 2019-08-29 DIAGNOSIS — J439 Emphysema, unspecified: Secondary | ICD-10-CM | POA: Diagnosis not present

## 2019-08-29 DIAGNOSIS — N139 Obstructive and reflux uropathy, unspecified: Secondary | ICD-10-CM | POA: Diagnosis not present

## 2019-08-29 DIAGNOSIS — Z79899 Other long term (current) drug therapy: Secondary | ICD-10-CM | POA: Diagnosis not present

## 2019-08-29 DIAGNOSIS — L89313 Pressure ulcer of right buttock, stage 3: Secondary | ICD-10-CM | POA: Diagnosis not present

## 2019-08-29 DIAGNOSIS — I69320 Aphasia following cerebral infarction: Secondary | ICD-10-CM | POA: Diagnosis not present

## 2019-08-29 DIAGNOSIS — R1310 Dysphagia, unspecified: Secondary | ICD-10-CM | POA: Diagnosis not present

## 2019-08-29 DIAGNOSIS — G8929 Other chronic pain: Secondary | ICD-10-CM | POA: Diagnosis not present

## 2019-08-29 DIAGNOSIS — I639 Cerebral infarction, unspecified: Secondary | ICD-10-CM | POA: Diagnosis not present

## 2019-08-29 DIAGNOSIS — I69391 Dysphagia following cerebral infarction: Secondary | ICD-10-CM | POA: Diagnosis not present

## 2019-08-29 DIAGNOSIS — I130 Hypertensive heart and chronic kidney disease with heart failure and stage 1 through stage 4 chronic kidney disease, or unspecified chronic kidney disease: Secondary | ICD-10-CM | POA: Diagnosis not present

## 2019-08-29 DIAGNOSIS — G4733 Obstructive sleep apnea (adult) (pediatric): Secondary | ICD-10-CM | POA: Diagnosis not present

## 2019-08-29 DIAGNOSIS — G8111 Spastic hemiplegia affecting right dominant side: Secondary | ICD-10-CM | POA: Diagnosis not present

## 2019-09-01 DIAGNOSIS — Z993 Dependence on wheelchair: Secondary | ICD-10-CM | POA: Diagnosis not present

## 2019-09-01 DIAGNOSIS — M1990 Unspecified osteoarthritis, unspecified site: Secondary | ICD-10-CM | POA: Diagnosis not present

## 2019-09-01 DIAGNOSIS — J439 Emphysema, unspecified: Secondary | ICD-10-CM | POA: Diagnosis not present

## 2019-09-01 DIAGNOSIS — R1310 Dysphagia, unspecified: Secondary | ICD-10-CM | POA: Diagnosis not present

## 2019-09-01 DIAGNOSIS — I2510 Atherosclerotic heart disease of native coronary artery without angina pectoris: Secondary | ICD-10-CM | POA: Diagnosis not present

## 2019-09-01 DIAGNOSIS — M419 Scoliosis, unspecified: Secondary | ICD-10-CM | POA: Diagnosis not present

## 2019-09-01 DIAGNOSIS — Z9181 History of falling: Secondary | ICD-10-CM | POA: Diagnosis not present

## 2019-09-01 DIAGNOSIS — N184 Chronic kidney disease, stage 4 (severe): Secondary | ICD-10-CM | POA: Diagnosis not present

## 2019-09-01 DIAGNOSIS — I130 Hypertensive heart and chronic kidney disease with heart failure and stage 1 through stage 4 chronic kidney disease, or unspecified chronic kidney disease: Secondary | ICD-10-CM | POA: Diagnosis not present

## 2019-09-01 DIAGNOSIS — I69351 Hemiplegia and hemiparesis following cerebral infarction affecting right dominant side: Secondary | ICD-10-CM | POA: Diagnosis not present

## 2019-09-01 DIAGNOSIS — E43 Unspecified severe protein-calorie malnutrition: Secondary | ICD-10-CM | POA: Diagnosis not present

## 2019-09-01 DIAGNOSIS — E7800 Pure hypercholesterolemia, unspecified: Secondary | ICD-10-CM | POA: Diagnosis not present

## 2019-09-01 DIAGNOSIS — G4733 Obstructive sleep apnea (adult) (pediatric): Secondary | ICD-10-CM | POA: Diagnosis not present

## 2019-09-01 DIAGNOSIS — Z87440 Personal history of urinary (tract) infections: Secondary | ICD-10-CM | POA: Diagnosis not present

## 2019-09-01 DIAGNOSIS — L89313 Pressure ulcer of right buttock, stage 3: Secondary | ICD-10-CM | POA: Diagnosis not present

## 2019-09-01 DIAGNOSIS — I69391 Dysphagia following cerebral infarction: Secondary | ICD-10-CM | POA: Diagnosis not present

## 2019-09-01 DIAGNOSIS — D631 Anemia in chronic kidney disease: Secondary | ICD-10-CM | POA: Diagnosis not present

## 2019-09-01 DIAGNOSIS — I5032 Chronic diastolic (congestive) heart failure: Secondary | ICD-10-CM | POA: Diagnosis not present

## 2019-09-01 DIAGNOSIS — I252 Old myocardial infarction: Secondary | ICD-10-CM | POA: Diagnosis not present

## 2019-09-01 DIAGNOSIS — N139 Obstructive and reflux uropathy, unspecified: Secondary | ICD-10-CM | POA: Diagnosis not present

## 2019-09-01 DIAGNOSIS — Z79899 Other long term (current) drug therapy: Secondary | ICD-10-CM | POA: Diagnosis not present

## 2019-09-01 DIAGNOSIS — G8929 Other chronic pain: Secondary | ICD-10-CM | POA: Diagnosis not present

## 2019-09-01 DIAGNOSIS — Z466 Encounter for fitting and adjustment of urinary device: Secondary | ICD-10-CM | POA: Diagnosis not present

## 2019-09-01 DIAGNOSIS — I69320 Aphasia following cerebral infarction: Secondary | ICD-10-CM | POA: Diagnosis not present

## 2019-09-01 DIAGNOSIS — E1122 Type 2 diabetes mellitus with diabetic chronic kidney disease: Secondary | ICD-10-CM | POA: Diagnosis not present

## 2019-09-05 ENCOUNTER — Encounter (HOSPITAL_BASED_OUTPATIENT_CLINIC_OR_DEPARTMENT_OTHER): Payer: Medicare Other | Admitting: Internal Medicine

## 2019-09-06 DIAGNOSIS — E43 Unspecified severe protein-calorie malnutrition: Secondary | ICD-10-CM | POA: Diagnosis not present

## 2019-09-06 DIAGNOSIS — I5032 Chronic diastolic (congestive) heart failure: Secondary | ICD-10-CM | POA: Diagnosis not present

## 2019-09-06 DIAGNOSIS — E7800 Pure hypercholesterolemia, unspecified: Secondary | ICD-10-CM | POA: Diagnosis not present

## 2019-09-06 DIAGNOSIS — I69391 Dysphagia following cerebral infarction: Secondary | ICD-10-CM | POA: Diagnosis not present

## 2019-09-06 DIAGNOSIS — N390 Urinary tract infection, site not specified: Secondary | ICD-10-CM | POA: Diagnosis not present

## 2019-09-06 DIAGNOSIS — L89313 Pressure ulcer of right buttock, stage 3: Secondary | ICD-10-CM | POA: Diagnosis not present

## 2019-09-06 DIAGNOSIS — Z87440 Personal history of urinary (tract) infections: Secondary | ICD-10-CM | POA: Diagnosis not present

## 2019-09-06 DIAGNOSIS — Z466 Encounter for fitting and adjustment of urinary device: Secondary | ICD-10-CM | POA: Diagnosis not present

## 2019-09-06 DIAGNOSIS — M419 Scoliosis, unspecified: Secondary | ICD-10-CM | POA: Diagnosis not present

## 2019-09-06 DIAGNOSIS — I252 Old myocardial infarction: Secondary | ICD-10-CM | POA: Diagnosis not present

## 2019-09-06 DIAGNOSIS — G4733 Obstructive sleep apnea (adult) (pediatric): Secondary | ICD-10-CM | POA: Diagnosis not present

## 2019-09-06 DIAGNOSIS — R1310 Dysphagia, unspecified: Secondary | ICD-10-CM | POA: Diagnosis not present

## 2019-09-06 DIAGNOSIS — M1990 Unspecified osteoarthritis, unspecified site: Secondary | ICD-10-CM | POA: Diagnosis not present

## 2019-09-06 DIAGNOSIS — G8929 Other chronic pain: Secondary | ICD-10-CM | POA: Diagnosis not present

## 2019-09-06 DIAGNOSIS — I69351 Hemiplegia and hemiparesis following cerebral infarction affecting right dominant side: Secondary | ICD-10-CM | POA: Diagnosis not present

## 2019-09-06 DIAGNOSIS — E1122 Type 2 diabetes mellitus with diabetic chronic kidney disease: Secondary | ICD-10-CM | POA: Diagnosis not present

## 2019-09-06 DIAGNOSIS — N139 Obstructive and reflux uropathy, unspecified: Secondary | ICD-10-CM | POA: Diagnosis not present

## 2019-09-06 DIAGNOSIS — Z79899 Other long term (current) drug therapy: Secondary | ICD-10-CM | POA: Diagnosis not present

## 2019-09-06 DIAGNOSIS — D631 Anemia in chronic kidney disease: Secondary | ICD-10-CM | POA: Diagnosis not present

## 2019-09-06 DIAGNOSIS — I2510 Atherosclerotic heart disease of native coronary artery without angina pectoris: Secondary | ICD-10-CM | POA: Diagnosis not present

## 2019-09-06 DIAGNOSIS — I130 Hypertensive heart and chronic kidney disease with heart failure and stage 1 through stage 4 chronic kidney disease, or unspecified chronic kidney disease: Secondary | ICD-10-CM | POA: Diagnosis not present

## 2019-09-06 DIAGNOSIS — Z993 Dependence on wheelchair: Secondary | ICD-10-CM | POA: Diagnosis not present

## 2019-09-06 DIAGNOSIS — I69320 Aphasia following cerebral infarction: Secondary | ICD-10-CM | POA: Diagnosis not present

## 2019-09-06 DIAGNOSIS — J439 Emphysema, unspecified: Secondary | ICD-10-CM | POA: Diagnosis not present

## 2019-09-06 DIAGNOSIS — Z9181 History of falling: Secondary | ICD-10-CM | POA: Diagnosis not present

## 2019-09-06 DIAGNOSIS — N184 Chronic kidney disease, stage 4 (severe): Secondary | ICD-10-CM | POA: Diagnosis not present

## 2019-09-08 DIAGNOSIS — G8929 Other chronic pain: Secondary | ICD-10-CM | POA: Diagnosis not present

## 2019-09-08 DIAGNOSIS — Z87440 Personal history of urinary (tract) infections: Secondary | ICD-10-CM | POA: Diagnosis not present

## 2019-09-08 DIAGNOSIS — Z993 Dependence on wheelchair: Secondary | ICD-10-CM | POA: Diagnosis not present

## 2019-09-08 DIAGNOSIS — I2510 Atherosclerotic heart disease of native coronary artery without angina pectoris: Secondary | ICD-10-CM | POA: Diagnosis not present

## 2019-09-08 DIAGNOSIS — M1990 Unspecified osteoarthritis, unspecified site: Secondary | ICD-10-CM | POA: Diagnosis not present

## 2019-09-08 DIAGNOSIS — Z79899 Other long term (current) drug therapy: Secondary | ICD-10-CM | POA: Diagnosis not present

## 2019-09-08 DIAGNOSIS — E1122 Type 2 diabetes mellitus with diabetic chronic kidney disease: Secondary | ICD-10-CM | POA: Diagnosis not present

## 2019-09-08 DIAGNOSIS — I69391 Dysphagia following cerebral infarction: Secondary | ICD-10-CM | POA: Diagnosis not present

## 2019-09-08 DIAGNOSIS — Z466 Encounter for fitting and adjustment of urinary device: Secondary | ICD-10-CM | POA: Diagnosis not present

## 2019-09-08 DIAGNOSIS — I69320 Aphasia following cerebral infarction: Secondary | ICD-10-CM | POA: Diagnosis not present

## 2019-09-08 DIAGNOSIS — D631 Anemia in chronic kidney disease: Secondary | ICD-10-CM | POA: Diagnosis not present

## 2019-09-08 DIAGNOSIS — I252 Old myocardial infarction: Secondary | ICD-10-CM | POA: Diagnosis not present

## 2019-09-08 DIAGNOSIS — E43 Unspecified severe protein-calorie malnutrition: Secondary | ICD-10-CM | POA: Diagnosis not present

## 2019-09-08 DIAGNOSIS — Z9181 History of falling: Secondary | ICD-10-CM | POA: Diagnosis not present

## 2019-09-08 DIAGNOSIS — I69351 Hemiplegia and hemiparesis following cerebral infarction affecting right dominant side: Secondary | ICD-10-CM | POA: Diagnosis not present

## 2019-09-08 DIAGNOSIS — E7800 Pure hypercholesterolemia, unspecified: Secondary | ICD-10-CM | POA: Diagnosis not present

## 2019-09-08 DIAGNOSIS — I5032 Chronic diastolic (congestive) heart failure: Secondary | ICD-10-CM | POA: Diagnosis not present

## 2019-09-08 DIAGNOSIS — N139 Obstructive and reflux uropathy, unspecified: Secondary | ICD-10-CM | POA: Diagnosis not present

## 2019-09-08 DIAGNOSIS — L89313 Pressure ulcer of right buttock, stage 3: Secondary | ICD-10-CM | POA: Diagnosis not present

## 2019-09-08 DIAGNOSIS — J439 Emphysema, unspecified: Secondary | ICD-10-CM | POA: Diagnosis not present

## 2019-09-08 DIAGNOSIS — M419 Scoliosis, unspecified: Secondary | ICD-10-CM | POA: Diagnosis not present

## 2019-09-08 DIAGNOSIS — R1310 Dysphagia, unspecified: Secondary | ICD-10-CM | POA: Diagnosis not present

## 2019-09-08 DIAGNOSIS — N184 Chronic kidney disease, stage 4 (severe): Secondary | ICD-10-CM | POA: Diagnosis not present

## 2019-09-08 DIAGNOSIS — G4733 Obstructive sleep apnea (adult) (pediatric): Secondary | ICD-10-CM | POA: Diagnosis not present

## 2019-09-08 DIAGNOSIS — I130 Hypertensive heart and chronic kidney disease with heart failure and stage 1 through stage 4 chronic kidney disease, or unspecified chronic kidney disease: Secondary | ICD-10-CM | POA: Diagnosis not present

## 2019-09-12 DIAGNOSIS — M1990 Unspecified osteoarthritis, unspecified site: Secondary | ICD-10-CM | POA: Diagnosis not present

## 2019-09-12 DIAGNOSIS — G8929 Other chronic pain: Secondary | ICD-10-CM | POA: Diagnosis not present

## 2019-09-12 DIAGNOSIS — E1122 Type 2 diabetes mellitus with diabetic chronic kidney disease: Secondary | ICD-10-CM | POA: Diagnosis not present

## 2019-09-12 DIAGNOSIS — Z993 Dependence on wheelchair: Secondary | ICD-10-CM | POA: Diagnosis not present

## 2019-09-12 DIAGNOSIS — N184 Chronic kidney disease, stage 4 (severe): Secondary | ICD-10-CM | POA: Diagnosis not present

## 2019-09-12 DIAGNOSIS — D631 Anemia in chronic kidney disease: Secondary | ICD-10-CM | POA: Diagnosis not present

## 2019-09-12 DIAGNOSIS — I69320 Aphasia following cerebral infarction: Secondary | ICD-10-CM | POA: Diagnosis not present

## 2019-09-12 DIAGNOSIS — G4733 Obstructive sleep apnea (adult) (pediatric): Secondary | ICD-10-CM | POA: Diagnosis not present

## 2019-09-12 DIAGNOSIS — E7800 Pure hypercholesterolemia, unspecified: Secondary | ICD-10-CM | POA: Diagnosis not present

## 2019-09-12 DIAGNOSIS — I130 Hypertensive heart and chronic kidney disease with heart failure and stage 1 through stage 4 chronic kidney disease, or unspecified chronic kidney disease: Secondary | ICD-10-CM | POA: Diagnosis not present

## 2019-09-12 DIAGNOSIS — I252 Old myocardial infarction: Secondary | ICD-10-CM | POA: Diagnosis not present

## 2019-09-12 DIAGNOSIS — I69351 Hemiplegia and hemiparesis following cerebral infarction affecting right dominant side: Secondary | ICD-10-CM | POA: Diagnosis not present

## 2019-09-12 DIAGNOSIS — Z79899 Other long term (current) drug therapy: Secondary | ICD-10-CM | POA: Diagnosis not present

## 2019-09-12 DIAGNOSIS — Z466 Encounter for fitting and adjustment of urinary device: Secondary | ICD-10-CM | POA: Diagnosis not present

## 2019-09-12 DIAGNOSIS — E43 Unspecified severe protein-calorie malnutrition: Secondary | ICD-10-CM | POA: Diagnosis not present

## 2019-09-12 DIAGNOSIS — I2510 Atherosclerotic heart disease of native coronary artery without angina pectoris: Secondary | ICD-10-CM | POA: Diagnosis not present

## 2019-09-12 DIAGNOSIS — Z9181 History of falling: Secondary | ICD-10-CM | POA: Diagnosis not present

## 2019-09-12 DIAGNOSIS — I5032 Chronic diastolic (congestive) heart failure: Secondary | ICD-10-CM | POA: Diagnosis not present

## 2019-09-12 DIAGNOSIS — N139 Obstructive and reflux uropathy, unspecified: Secondary | ICD-10-CM | POA: Diagnosis not present

## 2019-09-12 DIAGNOSIS — M419 Scoliosis, unspecified: Secondary | ICD-10-CM | POA: Diagnosis not present

## 2019-09-12 DIAGNOSIS — I69391 Dysphagia following cerebral infarction: Secondary | ICD-10-CM | POA: Diagnosis not present

## 2019-09-12 DIAGNOSIS — L89313 Pressure ulcer of right buttock, stage 3: Secondary | ICD-10-CM | POA: Diagnosis not present

## 2019-09-12 DIAGNOSIS — R1310 Dysphagia, unspecified: Secondary | ICD-10-CM | POA: Diagnosis not present

## 2019-09-12 DIAGNOSIS — Z87440 Personal history of urinary (tract) infections: Secondary | ICD-10-CM | POA: Diagnosis not present

## 2019-09-12 DIAGNOSIS — J439 Emphysema, unspecified: Secondary | ICD-10-CM | POA: Diagnosis not present

## 2019-09-15 DIAGNOSIS — Z466 Encounter for fitting and adjustment of urinary device: Secondary | ICD-10-CM | POA: Diagnosis not present

## 2019-09-15 DIAGNOSIS — M1990 Unspecified osteoarthritis, unspecified site: Secondary | ICD-10-CM | POA: Diagnosis not present

## 2019-09-15 DIAGNOSIS — E1122 Type 2 diabetes mellitus with diabetic chronic kidney disease: Secondary | ICD-10-CM | POA: Diagnosis not present

## 2019-09-15 DIAGNOSIS — E7800 Pure hypercholesterolemia, unspecified: Secondary | ICD-10-CM | POA: Diagnosis not present

## 2019-09-15 DIAGNOSIS — Z9181 History of falling: Secondary | ICD-10-CM | POA: Diagnosis not present

## 2019-09-15 DIAGNOSIS — I2510 Atherosclerotic heart disease of native coronary artery without angina pectoris: Secondary | ICD-10-CM | POA: Diagnosis not present

## 2019-09-15 DIAGNOSIS — G8929 Other chronic pain: Secondary | ICD-10-CM | POA: Diagnosis not present

## 2019-09-15 DIAGNOSIS — I69391 Dysphagia following cerebral infarction: Secondary | ICD-10-CM | POA: Diagnosis not present

## 2019-09-15 DIAGNOSIS — Z87440 Personal history of urinary (tract) infections: Secondary | ICD-10-CM | POA: Diagnosis not present

## 2019-09-15 DIAGNOSIS — R339 Retention of urine, unspecified: Secondary | ICD-10-CM | POA: Diagnosis not present

## 2019-09-15 DIAGNOSIS — I252 Old myocardial infarction: Secondary | ICD-10-CM | POA: Diagnosis not present

## 2019-09-15 DIAGNOSIS — R1310 Dysphagia, unspecified: Secondary | ICD-10-CM | POA: Diagnosis not present

## 2019-09-15 DIAGNOSIS — D631 Anemia in chronic kidney disease: Secondary | ICD-10-CM | POA: Diagnosis not present

## 2019-09-15 DIAGNOSIS — Z79899 Other long term (current) drug therapy: Secondary | ICD-10-CM | POA: Diagnosis not present

## 2019-09-15 DIAGNOSIS — G4733 Obstructive sleep apnea (adult) (pediatric): Secondary | ICD-10-CM | POA: Diagnosis not present

## 2019-09-15 DIAGNOSIS — I130 Hypertensive heart and chronic kidney disease with heart failure and stage 1 through stage 4 chronic kidney disease, or unspecified chronic kidney disease: Secondary | ICD-10-CM | POA: Diagnosis not present

## 2019-09-15 DIAGNOSIS — N319 Neuromuscular dysfunction of bladder, unspecified: Secondary | ICD-10-CM | POA: Diagnosis not present

## 2019-09-15 DIAGNOSIS — N184 Chronic kidney disease, stage 4 (severe): Secondary | ICD-10-CM | POA: Diagnosis not present

## 2019-09-15 DIAGNOSIS — E43 Unspecified severe protein-calorie malnutrition: Secondary | ICD-10-CM | POA: Diagnosis not present

## 2019-09-15 DIAGNOSIS — M419 Scoliosis, unspecified: Secondary | ICD-10-CM | POA: Diagnosis not present

## 2019-09-15 DIAGNOSIS — Z993 Dependence on wheelchair: Secondary | ICD-10-CM | POA: Diagnosis not present

## 2019-09-15 DIAGNOSIS — J439 Emphysema, unspecified: Secondary | ICD-10-CM | POA: Diagnosis not present

## 2019-09-15 DIAGNOSIS — I5032 Chronic diastolic (congestive) heart failure: Secondary | ICD-10-CM | POA: Diagnosis not present

## 2019-09-15 DIAGNOSIS — I69320 Aphasia following cerebral infarction: Secondary | ICD-10-CM | POA: Diagnosis not present

## 2019-09-15 DIAGNOSIS — I69351 Hemiplegia and hemiparesis following cerebral infarction affecting right dominant side: Secondary | ICD-10-CM | POA: Diagnosis not present

## 2019-09-15 DIAGNOSIS — L89313 Pressure ulcer of right buttock, stage 3: Secondary | ICD-10-CM | POA: Diagnosis not present

## 2019-09-15 DIAGNOSIS — N139 Obstructive and reflux uropathy, unspecified: Secondary | ICD-10-CM | POA: Diagnosis not present

## 2019-09-19 DIAGNOSIS — M419 Scoliosis, unspecified: Secondary | ICD-10-CM | POA: Diagnosis not present

## 2019-09-19 DIAGNOSIS — E1122 Type 2 diabetes mellitus with diabetic chronic kidney disease: Secondary | ICD-10-CM | POA: Diagnosis not present

## 2019-09-19 DIAGNOSIS — N184 Chronic kidney disease, stage 4 (severe): Secondary | ICD-10-CM | POA: Diagnosis not present

## 2019-09-19 DIAGNOSIS — R1310 Dysphagia, unspecified: Secondary | ICD-10-CM | POA: Diagnosis not present

## 2019-09-19 DIAGNOSIS — I130 Hypertensive heart and chronic kidney disease with heart failure and stage 1 through stage 4 chronic kidney disease, or unspecified chronic kidney disease: Secondary | ICD-10-CM | POA: Diagnosis not present

## 2019-09-19 DIAGNOSIS — I252 Old myocardial infarction: Secondary | ICD-10-CM | POA: Diagnosis not present

## 2019-09-19 DIAGNOSIS — Z9181 History of falling: Secondary | ICD-10-CM | POA: Diagnosis not present

## 2019-09-19 DIAGNOSIS — I69351 Hemiplegia and hemiparesis following cerebral infarction affecting right dominant side: Secondary | ICD-10-CM | POA: Diagnosis not present

## 2019-09-19 DIAGNOSIS — Z466 Encounter for fitting and adjustment of urinary device: Secondary | ICD-10-CM | POA: Diagnosis not present

## 2019-09-19 DIAGNOSIS — E7800 Pure hypercholesterolemia, unspecified: Secondary | ICD-10-CM | POA: Diagnosis not present

## 2019-09-19 DIAGNOSIS — E43 Unspecified severe protein-calorie malnutrition: Secondary | ICD-10-CM | POA: Diagnosis not present

## 2019-09-19 DIAGNOSIS — M1990 Unspecified osteoarthritis, unspecified site: Secondary | ICD-10-CM | POA: Diagnosis not present

## 2019-09-19 DIAGNOSIS — Z79899 Other long term (current) drug therapy: Secondary | ICD-10-CM | POA: Diagnosis not present

## 2019-09-19 DIAGNOSIS — L89313 Pressure ulcer of right buttock, stage 3: Secondary | ICD-10-CM | POA: Diagnosis not present

## 2019-09-19 DIAGNOSIS — G8929 Other chronic pain: Secondary | ICD-10-CM | POA: Diagnosis not present

## 2019-09-19 DIAGNOSIS — Z993 Dependence on wheelchair: Secondary | ICD-10-CM | POA: Diagnosis not present

## 2019-09-19 DIAGNOSIS — I69320 Aphasia following cerebral infarction: Secondary | ICD-10-CM | POA: Diagnosis not present

## 2019-09-19 DIAGNOSIS — D631 Anemia in chronic kidney disease: Secondary | ICD-10-CM | POA: Diagnosis not present

## 2019-09-19 DIAGNOSIS — I5032 Chronic diastolic (congestive) heart failure: Secondary | ICD-10-CM | POA: Diagnosis not present

## 2019-09-19 DIAGNOSIS — N139 Obstructive and reflux uropathy, unspecified: Secondary | ICD-10-CM | POA: Diagnosis not present

## 2019-09-19 DIAGNOSIS — Z87440 Personal history of urinary (tract) infections: Secondary | ICD-10-CM | POA: Diagnosis not present

## 2019-09-19 DIAGNOSIS — G4733 Obstructive sleep apnea (adult) (pediatric): Secondary | ICD-10-CM | POA: Diagnosis not present

## 2019-09-19 DIAGNOSIS — J439 Emphysema, unspecified: Secondary | ICD-10-CM | POA: Diagnosis not present

## 2019-09-19 DIAGNOSIS — I2510 Atherosclerotic heart disease of native coronary artery without angina pectoris: Secondary | ICD-10-CM | POA: Diagnosis not present

## 2019-09-19 DIAGNOSIS — I69391 Dysphagia following cerebral infarction: Secondary | ICD-10-CM | POA: Diagnosis not present

## 2019-09-20 DIAGNOSIS — Z466 Encounter for fitting and adjustment of urinary device: Secondary | ICD-10-CM | POA: Diagnosis not present

## 2019-09-20 DIAGNOSIS — N319 Neuromuscular dysfunction of bladder, unspecified: Secondary | ICD-10-CM | POA: Diagnosis not present

## 2019-09-20 DIAGNOSIS — R339 Retention of urine, unspecified: Secondary | ICD-10-CM | POA: Diagnosis not present

## 2019-09-22 DIAGNOSIS — I130 Hypertensive heart and chronic kidney disease with heart failure and stage 1 through stage 4 chronic kidney disease, or unspecified chronic kidney disease: Secondary | ICD-10-CM | POA: Diagnosis not present

## 2019-09-22 DIAGNOSIS — G8929 Other chronic pain: Secondary | ICD-10-CM | POA: Diagnosis not present

## 2019-09-22 DIAGNOSIS — Z993 Dependence on wheelchair: Secondary | ICD-10-CM | POA: Diagnosis not present

## 2019-09-22 DIAGNOSIS — I69391 Dysphagia following cerebral infarction: Secondary | ICD-10-CM | POA: Diagnosis not present

## 2019-09-22 DIAGNOSIS — I2510 Atherosclerotic heart disease of native coronary artery without angina pectoris: Secondary | ICD-10-CM | POA: Diagnosis not present

## 2019-09-22 DIAGNOSIS — J439 Emphysema, unspecified: Secondary | ICD-10-CM | POA: Diagnosis not present

## 2019-09-22 DIAGNOSIS — I252 Old myocardial infarction: Secondary | ICD-10-CM | POA: Diagnosis not present

## 2019-09-22 DIAGNOSIS — E43 Unspecified severe protein-calorie malnutrition: Secondary | ICD-10-CM | POA: Diagnosis not present

## 2019-09-22 DIAGNOSIS — M419 Scoliosis, unspecified: Secondary | ICD-10-CM | POA: Diagnosis not present

## 2019-09-22 DIAGNOSIS — G4733 Obstructive sleep apnea (adult) (pediatric): Secondary | ICD-10-CM | POA: Diagnosis not present

## 2019-09-22 DIAGNOSIS — E7800 Pure hypercholesterolemia, unspecified: Secondary | ICD-10-CM | POA: Diagnosis not present

## 2019-09-22 DIAGNOSIS — I5032 Chronic diastolic (congestive) heart failure: Secondary | ICD-10-CM | POA: Diagnosis not present

## 2019-09-22 DIAGNOSIS — I69351 Hemiplegia and hemiparesis following cerebral infarction affecting right dominant side: Secondary | ICD-10-CM | POA: Diagnosis not present

## 2019-09-22 DIAGNOSIS — N184 Chronic kidney disease, stage 4 (severe): Secondary | ICD-10-CM | POA: Diagnosis not present

## 2019-09-22 DIAGNOSIS — N139 Obstructive and reflux uropathy, unspecified: Secondary | ICD-10-CM | POA: Diagnosis not present

## 2019-09-22 DIAGNOSIS — Z87440 Personal history of urinary (tract) infections: Secondary | ICD-10-CM | POA: Diagnosis not present

## 2019-09-22 DIAGNOSIS — L89313 Pressure ulcer of right buttock, stage 3: Secondary | ICD-10-CM | POA: Diagnosis not present

## 2019-09-22 DIAGNOSIS — I69320 Aphasia following cerebral infarction: Secondary | ICD-10-CM | POA: Diagnosis not present

## 2019-09-22 DIAGNOSIS — M1990 Unspecified osteoarthritis, unspecified site: Secondary | ICD-10-CM | POA: Diagnosis not present

## 2019-09-22 DIAGNOSIS — Z466 Encounter for fitting and adjustment of urinary device: Secondary | ICD-10-CM | POA: Diagnosis not present

## 2019-09-22 DIAGNOSIS — D631 Anemia in chronic kidney disease: Secondary | ICD-10-CM | POA: Diagnosis not present

## 2019-09-22 DIAGNOSIS — Z9181 History of falling: Secondary | ICD-10-CM | POA: Diagnosis not present

## 2019-09-22 DIAGNOSIS — E1122 Type 2 diabetes mellitus with diabetic chronic kidney disease: Secondary | ICD-10-CM | POA: Diagnosis not present

## 2019-09-22 DIAGNOSIS — Z79899 Other long term (current) drug therapy: Secondary | ICD-10-CM | POA: Diagnosis not present

## 2019-09-22 DIAGNOSIS — R1310 Dysphagia, unspecified: Secondary | ICD-10-CM | POA: Diagnosis not present

## 2019-09-23 DIAGNOSIS — L89313 Pressure ulcer of right buttock, stage 3: Secondary | ICD-10-CM | POA: Diagnosis not present

## 2019-09-25 ENCOUNTER — Encounter (HOSPITAL_BASED_OUTPATIENT_CLINIC_OR_DEPARTMENT_OTHER): Payer: Medicare Other | Attending: Internal Medicine | Admitting: Internal Medicine

## 2019-09-26 DIAGNOSIS — M419 Scoliosis, unspecified: Secondary | ICD-10-CM | POA: Diagnosis not present

## 2019-09-26 DIAGNOSIS — I69391 Dysphagia following cerebral infarction: Secondary | ICD-10-CM | POA: Diagnosis not present

## 2019-09-26 DIAGNOSIS — G8929 Other chronic pain: Secondary | ICD-10-CM | POA: Diagnosis not present

## 2019-09-26 DIAGNOSIS — G4733 Obstructive sleep apnea (adult) (pediatric): Secondary | ICD-10-CM | POA: Diagnosis not present

## 2019-09-26 DIAGNOSIS — E43 Unspecified severe protein-calorie malnutrition: Secondary | ICD-10-CM | POA: Diagnosis not present

## 2019-09-26 DIAGNOSIS — I69351 Hemiplegia and hemiparesis following cerebral infarction affecting right dominant side: Secondary | ICD-10-CM | POA: Diagnosis not present

## 2019-09-26 DIAGNOSIS — I130 Hypertensive heart and chronic kidney disease with heart failure and stage 1 through stage 4 chronic kidney disease, or unspecified chronic kidney disease: Secondary | ICD-10-CM | POA: Diagnosis not present

## 2019-09-26 DIAGNOSIS — L89313 Pressure ulcer of right buttock, stage 3: Secondary | ICD-10-CM | POA: Diagnosis not present

## 2019-09-26 DIAGNOSIS — N184 Chronic kidney disease, stage 4 (severe): Secondary | ICD-10-CM | POA: Diagnosis not present

## 2019-09-26 DIAGNOSIS — J439 Emphysema, unspecified: Secondary | ICD-10-CM | POA: Diagnosis not present

## 2019-09-26 DIAGNOSIS — Z993 Dependence on wheelchair: Secondary | ICD-10-CM | POA: Diagnosis not present

## 2019-09-26 DIAGNOSIS — E7800 Pure hypercholesterolemia, unspecified: Secondary | ICD-10-CM | POA: Diagnosis not present

## 2019-09-26 DIAGNOSIS — D631 Anemia in chronic kidney disease: Secondary | ICD-10-CM | POA: Diagnosis not present

## 2019-09-26 DIAGNOSIS — Z466 Encounter for fitting and adjustment of urinary device: Secondary | ICD-10-CM | POA: Diagnosis not present

## 2019-09-26 DIAGNOSIS — Z87440 Personal history of urinary (tract) infections: Secondary | ICD-10-CM | POA: Diagnosis not present

## 2019-09-26 DIAGNOSIS — I2510 Atherosclerotic heart disease of native coronary artery without angina pectoris: Secondary | ICD-10-CM | POA: Diagnosis not present

## 2019-09-26 DIAGNOSIS — Z9181 History of falling: Secondary | ICD-10-CM | POA: Diagnosis not present

## 2019-09-26 DIAGNOSIS — R1310 Dysphagia, unspecified: Secondary | ICD-10-CM | POA: Diagnosis not present

## 2019-09-26 DIAGNOSIS — Z79899 Other long term (current) drug therapy: Secondary | ICD-10-CM | POA: Diagnosis not present

## 2019-09-26 DIAGNOSIS — N139 Obstructive and reflux uropathy, unspecified: Secondary | ICD-10-CM | POA: Diagnosis not present

## 2019-09-26 DIAGNOSIS — I69320 Aphasia following cerebral infarction: Secondary | ICD-10-CM | POA: Diagnosis not present

## 2019-09-26 DIAGNOSIS — E1122 Type 2 diabetes mellitus with diabetic chronic kidney disease: Secondary | ICD-10-CM | POA: Diagnosis not present

## 2019-09-26 DIAGNOSIS — I5032 Chronic diastolic (congestive) heart failure: Secondary | ICD-10-CM | POA: Diagnosis not present

## 2019-09-26 DIAGNOSIS — M1990 Unspecified osteoarthritis, unspecified site: Secondary | ICD-10-CM | POA: Diagnosis not present

## 2019-09-26 DIAGNOSIS — I252 Old myocardial infarction: Secondary | ICD-10-CM | POA: Diagnosis not present

## 2019-09-27 DIAGNOSIS — I639 Cerebral infarction, unspecified: Secondary | ICD-10-CM | POA: Diagnosis not present

## 2019-09-27 DIAGNOSIS — G8111 Spastic hemiplegia affecting right dominant side: Secondary | ICD-10-CM | POA: Diagnosis not present

## 2019-09-27 DIAGNOSIS — E119 Type 2 diabetes mellitus without complications: Secondary | ICD-10-CM | POA: Diagnosis not present

## 2019-09-28 DIAGNOSIS — E1122 Type 2 diabetes mellitus with diabetic chronic kidney disease: Secondary | ICD-10-CM | POA: Diagnosis not present

## 2019-09-28 DIAGNOSIS — G8929 Other chronic pain: Secondary | ICD-10-CM | POA: Diagnosis not present

## 2019-09-28 DIAGNOSIS — L89313 Pressure ulcer of right buttock, stage 3: Secondary | ICD-10-CM | POA: Diagnosis not present

## 2019-09-28 DIAGNOSIS — M1990 Unspecified osteoarthritis, unspecified site: Secondary | ICD-10-CM | POA: Diagnosis not present

## 2019-09-28 DIAGNOSIS — J439 Emphysema, unspecified: Secondary | ICD-10-CM | POA: Diagnosis not present

## 2019-09-28 DIAGNOSIS — I2510 Atherosclerotic heart disease of native coronary artery without angina pectoris: Secondary | ICD-10-CM | POA: Diagnosis not present

## 2019-09-28 DIAGNOSIS — D631 Anemia in chronic kidney disease: Secondary | ICD-10-CM | POA: Diagnosis not present

## 2019-09-28 DIAGNOSIS — Z466 Encounter for fitting and adjustment of urinary device: Secondary | ICD-10-CM | POA: Diagnosis not present

## 2019-09-28 DIAGNOSIS — N139 Obstructive and reflux uropathy, unspecified: Secondary | ICD-10-CM | POA: Diagnosis not present

## 2019-09-28 DIAGNOSIS — I130 Hypertensive heart and chronic kidney disease with heart failure and stage 1 through stage 4 chronic kidney disease, or unspecified chronic kidney disease: Secondary | ICD-10-CM | POA: Diagnosis not present

## 2019-09-28 DIAGNOSIS — Z79899 Other long term (current) drug therapy: Secondary | ICD-10-CM | POA: Diagnosis not present

## 2019-09-28 DIAGNOSIS — Z9181 History of falling: Secondary | ICD-10-CM | POA: Diagnosis not present

## 2019-09-28 DIAGNOSIS — I5032 Chronic diastolic (congestive) heart failure: Secondary | ICD-10-CM | POA: Diagnosis not present

## 2019-09-28 DIAGNOSIS — I69391 Dysphagia following cerebral infarction: Secondary | ICD-10-CM | POA: Diagnosis not present

## 2019-09-28 DIAGNOSIS — Z87440 Personal history of urinary (tract) infections: Secondary | ICD-10-CM | POA: Diagnosis not present

## 2019-09-28 DIAGNOSIS — I69351 Hemiplegia and hemiparesis following cerebral infarction affecting right dominant side: Secondary | ICD-10-CM | POA: Diagnosis not present

## 2019-09-28 DIAGNOSIS — R1310 Dysphagia, unspecified: Secondary | ICD-10-CM | POA: Diagnosis not present

## 2019-09-28 DIAGNOSIS — M419 Scoliosis, unspecified: Secondary | ICD-10-CM | POA: Diagnosis not present

## 2019-09-28 DIAGNOSIS — I69320 Aphasia following cerebral infarction: Secondary | ICD-10-CM | POA: Diagnosis not present

## 2019-09-28 DIAGNOSIS — E43 Unspecified severe protein-calorie malnutrition: Secondary | ICD-10-CM | POA: Diagnosis not present

## 2019-09-28 DIAGNOSIS — Z993 Dependence on wheelchair: Secondary | ICD-10-CM | POA: Diagnosis not present

## 2019-09-28 DIAGNOSIS — N184 Chronic kidney disease, stage 4 (severe): Secondary | ICD-10-CM | POA: Diagnosis not present

## 2019-09-28 DIAGNOSIS — E7800 Pure hypercholesterolemia, unspecified: Secondary | ICD-10-CM | POA: Diagnosis not present

## 2019-09-28 DIAGNOSIS — G4733 Obstructive sleep apnea (adult) (pediatric): Secondary | ICD-10-CM | POA: Diagnosis not present

## 2019-09-28 DIAGNOSIS — I252 Old myocardial infarction: Secondary | ICD-10-CM | POA: Diagnosis not present

## 2019-10-05 DIAGNOSIS — M1990 Unspecified osteoarthritis, unspecified site: Secondary | ICD-10-CM | POA: Diagnosis not present

## 2019-10-05 DIAGNOSIS — N139 Obstructive and reflux uropathy, unspecified: Secondary | ICD-10-CM | POA: Diagnosis not present

## 2019-10-05 DIAGNOSIS — L89313 Pressure ulcer of right buttock, stage 3: Secondary | ICD-10-CM | POA: Diagnosis not present

## 2019-10-05 DIAGNOSIS — I5032 Chronic diastolic (congestive) heart failure: Secondary | ICD-10-CM | POA: Diagnosis not present

## 2019-10-05 DIAGNOSIS — D631 Anemia in chronic kidney disease: Secondary | ICD-10-CM | POA: Diagnosis not present

## 2019-10-05 DIAGNOSIS — G8929 Other chronic pain: Secondary | ICD-10-CM | POA: Diagnosis not present

## 2019-10-05 DIAGNOSIS — J439 Emphysema, unspecified: Secondary | ICD-10-CM | POA: Diagnosis not present

## 2019-10-05 DIAGNOSIS — Z9181 History of falling: Secondary | ICD-10-CM | POA: Diagnosis not present

## 2019-10-05 DIAGNOSIS — I69391 Dysphagia following cerebral infarction: Secondary | ICD-10-CM | POA: Diagnosis not present

## 2019-10-05 DIAGNOSIS — I130 Hypertensive heart and chronic kidney disease with heart failure and stage 1 through stage 4 chronic kidney disease, or unspecified chronic kidney disease: Secondary | ICD-10-CM | POA: Diagnosis not present

## 2019-10-05 DIAGNOSIS — I69351 Hemiplegia and hemiparesis following cerebral infarction affecting right dominant side: Secondary | ICD-10-CM | POA: Diagnosis not present

## 2019-10-05 DIAGNOSIS — Z87440 Personal history of urinary (tract) infections: Secondary | ICD-10-CM | POA: Diagnosis not present

## 2019-10-05 DIAGNOSIS — G4733 Obstructive sleep apnea (adult) (pediatric): Secondary | ICD-10-CM | POA: Diagnosis not present

## 2019-10-05 DIAGNOSIS — Z993 Dependence on wheelchair: Secondary | ICD-10-CM | POA: Diagnosis not present

## 2019-10-05 DIAGNOSIS — E7800 Pure hypercholesterolemia, unspecified: Secondary | ICD-10-CM | POA: Diagnosis not present

## 2019-10-05 DIAGNOSIS — E1122 Type 2 diabetes mellitus with diabetic chronic kidney disease: Secondary | ICD-10-CM | POA: Diagnosis not present

## 2019-10-05 DIAGNOSIS — I252 Old myocardial infarction: Secondary | ICD-10-CM | POA: Diagnosis not present

## 2019-10-05 DIAGNOSIS — M419 Scoliosis, unspecified: Secondary | ICD-10-CM | POA: Diagnosis not present

## 2019-10-05 DIAGNOSIS — I69320 Aphasia following cerebral infarction: Secondary | ICD-10-CM | POA: Diagnosis not present

## 2019-10-05 DIAGNOSIS — Z79899 Other long term (current) drug therapy: Secondary | ICD-10-CM | POA: Diagnosis not present

## 2019-10-05 DIAGNOSIS — Z466 Encounter for fitting and adjustment of urinary device: Secondary | ICD-10-CM | POA: Diagnosis not present

## 2019-10-05 DIAGNOSIS — R1310 Dysphagia, unspecified: Secondary | ICD-10-CM | POA: Diagnosis not present

## 2019-10-05 DIAGNOSIS — N184 Chronic kidney disease, stage 4 (severe): Secondary | ICD-10-CM | POA: Diagnosis not present

## 2019-10-05 DIAGNOSIS — E43 Unspecified severe protein-calorie malnutrition: Secondary | ICD-10-CM | POA: Diagnosis not present

## 2019-10-05 DIAGNOSIS — I2510 Atherosclerotic heart disease of native coronary artery without angina pectoris: Secondary | ICD-10-CM | POA: Diagnosis not present

## 2019-10-06 ENCOUNTER — Encounter (HOSPITAL_BASED_OUTPATIENT_CLINIC_OR_DEPARTMENT_OTHER): Payer: Medicare Other | Admitting: Internal Medicine

## 2019-10-09 DIAGNOSIS — I2510 Atherosclerotic heart disease of native coronary artery without angina pectoris: Secondary | ICD-10-CM | POA: Diagnosis not present

## 2019-10-09 DIAGNOSIS — Z87440 Personal history of urinary (tract) infections: Secondary | ICD-10-CM | POA: Diagnosis not present

## 2019-10-09 DIAGNOSIS — M419 Scoliosis, unspecified: Secondary | ICD-10-CM | POA: Diagnosis not present

## 2019-10-09 DIAGNOSIS — J439 Emphysema, unspecified: Secondary | ICD-10-CM | POA: Diagnosis not present

## 2019-10-09 DIAGNOSIS — E1122 Type 2 diabetes mellitus with diabetic chronic kidney disease: Secondary | ICD-10-CM | POA: Diagnosis not present

## 2019-10-09 DIAGNOSIS — E43 Unspecified severe protein-calorie malnutrition: Secondary | ICD-10-CM | POA: Diagnosis not present

## 2019-10-09 DIAGNOSIS — Z466 Encounter for fitting and adjustment of urinary device: Secondary | ICD-10-CM | POA: Diagnosis not present

## 2019-10-09 DIAGNOSIS — I69351 Hemiplegia and hemiparesis following cerebral infarction affecting right dominant side: Secondary | ICD-10-CM | POA: Diagnosis not present

## 2019-10-09 DIAGNOSIS — I5032 Chronic diastolic (congestive) heart failure: Secondary | ICD-10-CM | POA: Diagnosis not present

## 2019-10-09 DIAGNOSIS — Z79899 Other long term (current) drug therapy: Secondary | ICD-10-CM | POA: Diagnosis not present

## 2019-10-09 DIAGNOSIS — Z9181 History of falling: Secondary | ICD-10-CM | POA: Diagnosis not present

## 2019-10-09 DIAGNOSIS — D631 Anemia in chronic kidney disease: Secondary | ICD-10-CM | POA: Diagnosis not present

## 2019-10-09 DIAGNOSIS — G8929 Other chronic pain: Secondary | ICD-10-CM | POA: Diagnosis not present

## 2019-10-09 DIAGNOSIS — I69320 Aphasia following cerebral infarction: Secondary | ICD-10-CM | POA: Diagnosis not present

## 2019-10-09 DIAGNOSIS — N184 Chronic kidney disease, stage 4 (severe): Secondary | ICD-10-CM | POA: Diagnosis not present

## 2019-10-09 DIAGNOSIS — I252 Old myocardial infarction: Secondary | ICD-10-CM | POA: Diagnosis not present

## 2019-10-09 DIAGNOSIS — N139 Obstructive and reflux uropathy, unspecified: Secondary | ICD-10-CM | POA: Diagnosis not present

## 2019-10-09 DIAGNOSIS — E7800 Pure hypercholesterolemia, unspecified: Secondary | ICD-10-CM | POA: Diagnosis not present

## 2019-10-09 DIAGNOSIS — M1990 Unspecified osteoarthritis, unspecified site: Secondary | ICD-10-CM | POA: Diagnosis not present

## 2019-10-09 DIAGNOSIS — I130 Hypertensive heart and chronic kidney disease with heart failure and stage 1 through stage 4 chronic kidney disease, or unspecified chronic kidney disease: Secondary | ICD-10-CM | POA: Diagnosis not present

## 2019-10-09 DIAGNOSIS — I69391 Dysphagia following cerebral infarction: Secondary | ICD-10-CM | POA: Diagnosis not present

## 2019-10-09 DIAGNOSIS — G4733 Obstructive sleep apnea (adult) (pediatric): Secondary | ICD-10-CM | POA: Diagnosis not present

## 2019-10-09 DIAGNOSIS — L89313 Pressure ulcer of right buttock, stage 3: Secondary | ICD-10-CM | POA: Diagnosis not present

## 2019-10-09 DIAGNOSIS — Z993 Dependence on wheelchair: Secondary | ICD-10-CM | POA: Diagnosis not present

## 2019-10-09 DIAGNOSIS — R1310 Dysphagia, unspecified: Secondary | ICD-10-CM | POA: Diagnosis not present

## 2019-10-10 DIAGNOSIS — L89313 Pressure ulcer of right buttock, stage 3: Secondary | ICD-10-CM | POA: Diagnosis not present

## 2019-10-10 DIAGNOSIS — Z466 Encounter for fitting and adjustment of urinary device: Secondary | ICD-10-CM | POA: Diagnosis not present

## 2019-10-10 DIAGNOSIS — N319 Neuromuscular dysfunction of bladder, unspecified: Secondary | ICD-10-CM | POA: Diagnosis not present

## 2019-10-10 DIAGNOSIS — R339 Retention of urine, unspecified: Secondary | ICD-10-CM | POA: Diagnosis not present

## 2019-10-11 DIAGNOSIS — I130 Hypertensive heart and chronic kidney disease with heart failure and stage 1 through stage 4 chronic kidney disease, or unspecified chronic kidney disease: Secondary | ICD-10-CM | POA: Diagnosis not present

## 2019-10-11 DIAGNOSIS — M419 Scoliosis, unspecified: Secondary | ICD-10-CM | POA: Diagnosis not present

## 2019-10-11 DIAGNOSIS — G8929 Other chronic pain: Secondary | ICD-10-CM | POA: Diagnosis not present

## 2019-10-11 DIAGNOSIS — I5032 Chronic diastolic (congestive) heart failure: Secondary | ICD-10-CM | POA: Diagnosis not present

## 2019-10-11 DIAGNOSIS — G4733 Obstructive sleep apnea (adult) (pediatric): Secondary | ICD-10-CM | POA: Diagnosis not present

## 2019-10-11 DIAGNOSIS — N184 Chronic kidney disease, stage 4 (severe): Secondary | ICD-10-CM | POA: Diagnosis not present

## 2019-10-11 DIAGNOSIS — Z79899 Other long term (current) drug therapy: Secondary | ICD-10-CM | POA: Diagnosis not present

## 2019-10-11 DIAGNOSIS — E1122 Type 2 diabetes mellitus with diabetic chronic kidney disease: Secondary | ICD-10-CM | POA: Diagnosis not present

## 2019-10-11 DIAGNOSIS — L89313 Pressure ulcer of right buttock, stage 3: Secondary | ICD-10-CM | POA: Diagnosis not present

## 2019-10-11 DIAGNOSIS — N139 Obstructive and reflux uropathy, unspecified: Secondary | ICD-10-CM | POA: Diagnosis not present

## 2019-10-11 DIAGNOSIS — I69391 Dysphagia following cerebral infarction: Secondary | ICD-10-CM | POA: Diagnosis not present

## 2019-10-11 DIAGNOSIS — Z9181 History of falling: Secondary | ICD-10-CM | POA: Diagnosis not present

## 2019-10-11 DIAGNOSIS — I69320 Aphasia following cerebral infarction: Secondary | ICD-10-CM | POA: Diagnosis not present

## 2019-10-11 DIAGNOSIS — E43 Unspecified severe protein-calorie malnutrition: Secondary | ICD-10-CM | POA: Diagnosis not present

## 2019-10-11 DIAGNOSIS — M1990 Unspecified osteoarthritis, unspecified site: Secondary | ICD-10-CM | POA: Diagnosis not present

## 2019-10-11 DIAGNOSIS — I2510 Atherosclerotic heart disease of native coronary artery without angina pectoris: Secondary | ICD-10-CM | POA: Diagnosis not present

## 2019-10-11 DIAGNOSIS — Z993 Dependence on wheelchair: Secondary | ICD-10-CM | POA: Diagnosis not present

## 2019-10-11 DIAGNOSIS — Z87440 Personal history of urinary (tract) infections: Secondary | ICD-10-CM | POA: Diagnosis not present

## 2019-10-11 DIAGNOSIS — R1310 Dysphagia, unspecified: Secondary | ICD-10-CM | POA: Diagnosis not present

## 2019-10-11 DIAGNOSIS — I252 Old myocardial infarction: Secondary | ICD-10-CM | POA: Diagnosis not present

## 2019-10-11 DIAGNOSIS — I69351 Hemiplegia and hemiparesis following cerebral infarction affecting right dominant side: Secondary | ICD-10-CM | POA: Diagnosis not present

## 2019-10-11 DIAGNOSIS — E7800 Pure hypercholesterolemia, unspecified: Secondary | ICD-10-CM | POA: Diagnosis not present

## 2019-10-11 DIAGNOSIS — J439 Emphysema, unspecified: Secondary | ICD-10-CM | POA: Diagnosis not present

## 2019-10-11 DIAGNOSIS — D631 Anemia in chronic kidney disease: Secondary | ICD-10-CM | POA: Diagnosis not present

## 2019-10-11 DIAGNOSIS — Z466 Encounter for fitting and adjustment of urinary device: Secondary | ICD-10-CM | POA: Diagnosis not present

## 2019-10-14 DIAGNOSIS — I10 Essential (primary) hypertension: Secondary | ICD-10-CM | POA: Diagnosis not present

## 2019-10-14 DIAGNOSIS — E118 Type 2 diabetes mellitus with unspecified complications: Secondary | ICD-10-CM | POA: Diagnosis not present

## 2019-10-14 DIAGNOSIS — Z960 Presence of urogenital implants: Secondary | ICD-10-CM | POA: Diagnosis not present

## 2019-10-14 DIAGNOSIS — E785 Hyperlipidemia, unspecified: Secondary | ICD-10-CM | POA: Diagnosis not present

## 2019-10-14 DIAGNOSIS — G894 Chronic pain syndrome: Secondary | ICD-10-CM | POA: Diagnosis not present

## 2019-10-17 ENCOUNTER — Ambulatory Visit (INDEPENDENT_AMBULATORY_CARE_PROVIDER_SITE_OTHER): Payer: Medicare Other | Admitting: Student

## 2019-10-17 ENCOUNTER — Encounter: Payer: Self-pay | Admitting: Student

## 2019-10-17 ENCOUNTER — Other Ambulatory Visit: Payer: Self-pay

## 2019-10-17 DIAGNOSIS — Z79891 Long term (current) use of opiate analgesic: Secondary | ICD-10-CM

## 2019-10-17 DIAGNOSIS — I2510 Atherosclerotic heart disease of native coronary artery without angina pectoris: Secondary | ICD-10-CM | POA: Diagnosis not present

## 2019-10-17 DIAGNOSIS — G4733 Obstructive sleep apnea (adult) (pediatric): Secondary | ICD-10-CM | POA: Diagnosis not present

## 2019-10-17 DIAGNOSIS — R1310 Dysphagia, unspecified: Secondary | ICD-10-CM | POA: Diagnosis not present

## 2019-10-17 DIAGNOSIS — I69359 Hemiplegia and hemiparesis following cerebral infarction affecting unspecified side: Secondary | ICD-10-CM

## 2019-10-17 DIAGNOSIS — I69320 Aphasia following cerebral infarction: Secondary | ICD-10-CM | POA: Diagnosis not present

## 2019-10-17 DIAGNOSIS — N184 Chronic kidney disease, stage 4 (severe): Secondary | ICD-10-CM

## 2019-10-17 DIAGNOSIS — M1990 Unspecified osteoarthritis, unspecified site: Secondary | ICD-10-CM | POA: Diagnosis not present

## 2019-10-17 DIAGNOSIS — Z79899 Other long term (current) drug therapy: Secondary | ICD-10-CM | POA: Diagnosis not present

## 2019-10-17 DIAGNOSIS — I10 Essential (primary) hypertension: Secondary | ICD-10-CM

## 2019-10-17 DIAGNOSIS — E7800 Pure hypercholesterolemia, unspecified: Secondary | ICD-10-CM | POA: Diagnosis not present

## 2019-10-17 DIAGNOSIS — I69391 Dysphagia following cerebral infarction: Secondary | ICD-10-CM | POA: Diagnosis not present

## 2019-10-17 DIAGNOSIS — D631 Anemia in chronic kidney disease: Secondary | ICD-10-CM | POA: Diagnosis not present

## 2019-10-17 DIAGNOSIS — M419 Scoliosis, unspecified: Secondary | ICD-10-CM | POA: Diagnosis not present

## 2019-10-17 DIAGNOSIS — Z466 Encounter for fitting and adjustment of urinary device: Secondary | ICD-10-CM | POA: Diagnosis not present

## 2019-10-17 DIAGNOSIS — Z9181 History of falling: Secondary | ICD-10-CM | POA: Diagnosis not present

## 2019-10-17 DIAGNOSIS — E785 Hyperlipidemia, unspecified: Secondary | ICD-10-CM | POA: Diagnosis not present

## 2019-10-17 DIAGNOSIS — G8929 Other chronic pain: Secondary | ICD-10-CM | POA: Diagnosis not present

## 2019-10-17 DIAGNOSIS — L89313 Pressure ulcer of right buttock, stage 3: Secondary | ICD-10-CM | POA: Diagnosis not present

## 2019-10-17 DIAGNOSIS — I130 Hypertensive heart and chronic kidney disease with heart failure and stage 1 through stage 4 chronic kidney disease, or unspecified chronic kidney disease: Secondary | ICD-10-CM | POA: Diagnosis not present

## 2019-10-17 DIAGNOSIS — E1122 Type 2 diabetes mellitus with diabetic chronic kidney disease: Secondary | ICD-10-CM | POA: Diagnosis not present

## 2019-10-17 DIAGNOSIS — I69351 Hemiplegia and hemiparesis following cerebral infarction affecting right dominant side: Secondary | ICD-10-CM | POA: Diagnosis not present

## 2019-10-17 DIAGNOSIS — E43 Unspecified severe protein-calorie malnutrition: Secondary | ICD-10-CM | POA: Diagnosis not present

## 2019-10-17 DIAGNOSIS — N139 Obstructive and reflux uropathy, unspecified: Secondary | ICD-10-CM | POA: Diagnosis not present

## 2019-10-17 DIAGNOSIS — J439 Emphysema, unspecified: Secondary | ICD-10-CM | POA: Diagnosis not present

## 2019-10-17 DIAGNOSIS — I252 Old myocardial infarction: Secondary | ICD-10-CM | POA: Diagnosis not present

## 2019-10-17 DIAGNOSIS — Z87440 Personal history of urinary (tract) infections: Secondary | ICD-10-CM | POA: Diagnosis not present

## 2019-10-17 DIAGNOSIS — I5032 Chronic diastolic (congestive) heart failure: Secondary | ICD-10-CM | POA: Diagnosis not present

## 2019-10-17 DIAGNOSIS — Z993 Dependence on wheelchair: Secondary | ICD-10-CM | POA: Diagnosis not present

## 2019-10-17 NOTE — Assessment & Plan Note (Addendum)
Patient is currently taking oxycodone 10 mg for chronic pain.  Opioids is prescribed by her pain management physician.  Plan: -Continue oxycodone 10 mg as needed for pain  -Continue following up with pain management

## 2019-10-17 NOTE — Assessment & Plan Note (Signed)
Continue atorvastatin 20 mg.  We will repeat lipid panel next visit.  Plan: -Atorvastatin 20 mg -Lipid panel next visit

## 2019-10-17 NOTE — Assessment & Plan Note (Signed)
Last hemoglobin 9.1 with MCV of 91.  B12 and folate was normal.  This is likely anemia due to chronic kidney disease.  We will recheck a CBC to monitor for hemoglobin trend.  Patient may be beneficial from EPO injection.   Plan: -Repeat CBC

## 2019-10-17 NOTE — Addendum Note (Signed)
Addended by: Truddie Crumble on: 10/17/2019 05:15 PM   Modules accepted: Orders

## 2019-10-17 NOTE — Assessment & Plan Note (Addendum)
Patient have history of chronic kidney disease with baseline creatinine of 2.2.  Last creatinine in 2020 was 4.13.  I will recheck a BMP today to see creatinine trend.  Unsure if a nephrology referral will be beneficial for the patient given that she may not be a candidate for dialysis due to her limited ambulation.   Plan: -Repeat BMP

## 2019-10-17 NOTE — Assessment & Plan Note (Signed)
Patient currently taking amlodipine 10 mg.  Blood pressure check at home range 130s/90s.  No change in medication at this time.  Pain: -Continue amlodipine 10 mg

## 2019-10-17 NOTE — Assessment & Plan Note (Signed)
Patient is currently homebound.  She cannot perform ADLs on her own.  Her daughter, her caregiver, is taking care of everything including ADLs and medicines.  They do have a nurse from [facility] who comes by to change catheter and can also draw blood.

## 2019-10-17 NOTE — Progress Notes (Addendum)
  [facility] Internal Medicine Residency Telephone Encounter Continuity Care Appointment  HPI:   This telephone encounter was created for [name] on 10/17/2019 for the following purpose/cc of hypertension management.   Past Medical History:  Past Medical History:  Diagnosis Date  . Anemia   . CHF (congestive heart failure) ([place])   . Chronic back pain   . COPD (chronic obstructive pulmonary disease) ([place])   . Coronary artery disease   . Diabetes mellitus   . DJD (degenerative joint disease)   . Emphysema   . Hypercholesteremia   . Hypertension   . Myocardial infarction ([place])   . Obesity   . Obesity hypoventilation syndrome ([place])   . Renal insufficiency   . Scoliosis   . Sleep apnea   . Stroke ([place])   . TIA (transient ischemic attack)       ROS:   Patient denies chest pain, shortness of breath, new onset of weakness, extremity swelling.   Assessment / Plan / Recommendations:   Please see A&P under problem oriented charting for assessment of the patient's acute and chronic medical conditions.   As always, pt is advised that if symptoms worsen or new symptoms arise, they should go to an urgent care facility or to to ER for further evaluation.   Consent and Medical Decision Making:   Patient seen with Dr. Angelia Holmes  This is a telephone encounter between Michelle Holmes and Michelle Holmes on 10/17/2019 for a hypertension management. The visit was conducted with the patient located at home and Michelle Holmes at [facility]. The patient's identity was confirmed using their DOB and current address. The patient has consented to being evaluated through a telephone encounter and understands the associated risks (an examination cannot be done and the patient may need to come in for an appointment) / benefits (allows the patient to remain at home, decreasing exposure to coronavirus). I personally spent 10 minutes on medical discussion.

## 2019-10-19 NOTE — Progress Notes (Signed)
Internal Medicine Clinic Attending  I spoke with the patient.  I personally confirmed the key portions of the history and exam documented by Dr. Alfonse Spruce and I reviewed pertinent patient test results.  The assessment, diagnosis, and plan were formulated together and I agree with the documentation in the resident's note.

## 2019-10-20 DIAGNOSIS — I69351 Hemiplegia and hemiparesis following cerebral infarction affecting right dominant side: Secondary | ICD-10-CM | POA: Diagnosis not present

## 2019-10-20 DIAGNOSIS — M419 Scoliosis, unspecified: Secondary | ICD-10-CM | POA: Diagnosis not present

## 2019-10-20 DIAGNOSIS — G4733 Obstructive sleep apnea (adult) (pediatric): Secondary | ICD-10-CM | POA: Diagnosis not present

## 2019-10-20 DIAGNOSIS — Z466 Encounter for fitting and adjustment of urinary device: Secondary | ICD-10-CM | POA: Diagnosis not present

## 2019-10-20 DIAGNOSIS — I5032 Chronic diastolic (congestive) heart failure: Secondary | ICD-10-CM | POA: Diagnosis not present

## 2019-10-20 DIAGNOSIS — Z87440 Personal history of urinary (tract) infections: Secondary | ICD-10-CM | POA: Diagnosis not present

## 2019-10-20 DIAGNOSIS — Z993 Dependence on wheelchair: Secondary | ICD-10-CM | POA: Diagnosis not present

## 2019-10-20 DIAGNOSIS — I2510 Atherosclerotic heart disease of native coronary artery without angina pectoris: Secondary | ICD-10-CM | POA: Diagnosis not present

## 2019-10-20 DIAGNOSIS — I252 Old myocardial infarction: Secondary | ICD-10-CM | POA: Diagnosis not present

## 2019-10-20 DIAGNOSIS — N184 Chronic kidney disease, stage 4 (severe): Secondary | ICD-10-CM | POA: Diagnosis not present

## 2019-10-20 DIAGNOSIS — M1990 Unspecified osteoarthritis, unspecified site: Secondary | ICD-10-CM | POA: Diagnosis not present

## 2019-10-20 DIAGNOSIS — I69320 Aphasia following cerebral infarction: Secondary | ICD-10-CM | POA: Diagnosis not present

## 2019-10-20 DIAGNOSIS — J439 Emphysema, unspecified: Secondary | ICD-10-CM | POA: Diagnosis not present

## 2019-10-20 DIAGNOSIS — D631 Anemia in chronic kidney disease: Secondary | ICD-10-CM | POA: Diagnosis not present

## 2019-10-20 DIAGNOSIS — G8929 Other chronic pain: Secondary | ICD-10-CM | POA: Diagnosis not present

## 2019-10-20 DIAGNOSIS — E43 Unspecified severe protein-calorie malnutrition: Secondary | ICD-10-CM | POA: Diagnosis not present

## 2019-10-20 DIAGNOSIS — R1310 Dysphagia, unspecified: Secondary | ICD-10-CM | POA: Diagnosis not present

## 2019-10-20 DIAGNOSIS — Z79899 Other long term (current) drug therapy: Secondary | ICD-10-CM | POA: Diagnosis not present

## 2019-10-20 DIAGNOSIS — I130 Hypertensive heart and chronic kidney disease with heart failure and stage 1 through stage 4 chronic kidney disease, or unspecified chronic kidney disease: Secondary | ICD-10-CM | POA: Diagnosis not present

## 2019-10-20 DIAGNOSIS — Z9181 History of falling: Secondary | ICD-10-CM | POA: Diagnosis not present

## 2019-10-20 DIAGNOSIS — I69391 Dysphagia following cerebral infarction: Secondary | ICD-10-CM | POA: Diagnosis not present

## 2019-10-20 DIAGNOSIS — N139 Obstructive and reflux uropathy, unspecified: Secondary | ICD-10-CM | POA: Diagnosis not present

## 2019-10-20 DIAGNOSIS — E1122 Type 2 diabetes mellitus with diabetic chronic kidney disease: Secondary | ICD-10-CM | POA: Diagnosis not present

## 2019-10-20 DIAGNOSIS — L89313 Pressure ulcer of right buttock, stage 3: Secondary | ICD-10-CM | POA: Diagnosis not present

## 2019-10-20 DIAGNOSIS — E7800 Pure hypercholesterolemia, unspecified: Secondary | ICD-10-CM | POA: Diagnosis not present

## 2019-10-24 DIAGNOSIS — L89313 Pressure ulcer of right buttock, stage 3: Secondary | ICD-10-CM | POA: Diagnosis not present

## 2019-10-25 DIAGNOSIS — E1122 Type 2 diabetes mellitus with diabetic chronic kidney disease: Secondary | ICD-10-CM | POA: Diagnosis not present

## 2019-10-25 DIAGNOSIS — L89313 Pressure ulcer of right buttock, stage 3: Secondary | ICD-10-CM | POA: Diagnosis not present

## 2019-10-25 DIAGNOSIS — I69320 Aphasia following cerebral infarction: Secondary | ICD-10-CM | POA: Diagnosis not present

## 2019-10-25 DIAGNOSIS — I69391 Dysphagia following cerebral infarction: Secondary | ICD-10-CM | POA: Diagnosis not present

## 2019-10-25 DIAGNOSIS — R1310 Dysphagia, unspecified: Secondary | ICD-10-CM | POA: Diagnosis not present

## 2019-10-25 DIAGNOSIS — N139 Obstructive and reflux uropathy, unspecified: Secondary | ICD-10-CM | POA: Diagnosis not present

## 2019-10-25 DIAGNOSIS — Z79899 Other long term (current) drug therapy: Secondary | ICD-10-CM | POA: Diagnosis not present

## 2019-10-25 DIAGNOSIS — Z87440 Personal history of urinary (tract) infections: Secondary | ICD-10-CM | POA: Diagnosis not present

## 2019-10-25 DIAGNOSIS — M1990 Unspecified osteoarthritis, unspecified site: Secondary | ICD-10-CM | POA: Diagnosis not present

## 2019-10-25 DIAGNOSIS — G4733 Obstructive sleep apnea (adult) (pediatric): Secondary | ICD-10-CM | POA: Diagnosis not present

## 2019-10-25 DIAGNOSIS — I130 Hypertensive heart and chronic kidney disease with heart failure and stage 1 through stage 4 chronic kidney disease, or unspecified chronic kidney disease: Secondary | ICD-10-CM | POA: Diagnosis not present

## 2019-10-25 DIAGNOSIS — I252 Old myocardial infarction: Secondary | ICD-10-CM | POA: Diagnosis not present

## 2019-10-25 DIAGNOSIS — G8929 Other chronic pain: Secondary | ICD-10-CM | POA: Diagnosis not present

## 2019-10-25 DIAGNOSIS — E43 Unspecified severe protein-calorie malnutrition: Secondary | ICD-10-CM | POA: Diagnosis not present

## 2019-10-25 DIAGNOSIS — Z993 Dependence on wheelchair: Secondary | ICD-10-CM | POA: Diagnosis not present

## 2019-10-25 DIAGNOSIS — M419 Scoliosis, unspecified: Secondary | ICD-10-CM | POA: Diagnosis not present

## 2019-10-25 DIAGNOSIS — I2510 Atherosclerotic heart disease of native coronary artery without angina pectoris: Secondary | ICD-10-CM | POA: Diagnosis not present

## 2019-10-25 DIAGNOSIS — D631 Anemia in chronic kidney disease: Secondary | ICD-10-CM | POA: Diagnosis not present

## 2019-10-25 DIAGNOSIS — I5032 Chronic diastolic (congestive) heart failure: Secondary | ICD-10-CM | POA: Diagnosis not present

## 2019-10-25 DIAGNOSIS — J439 Emphysema, unspecified: Secondary | ICD-10-CM | POA: Diagnosis not present

## 2019-10-25 DIAGNOSIS — Z9181 History of falling: Secondary | ICD-10-CM | POA: Diagnosis not present

## 2019-10-25 DIAGNOSIS — I69351 Hemiplegia and hemiparesis following cerebral infarction affecting right dominant side: Secondary | ICD-10-CM | POA: Diagnosis not present

## 2019-10-25 DIAGNOSIS — E7800 Pure hypercholesterolemia, unspecified: Secondary | ICD-10-CM | POA: Diagnosis not present

## 2019-10-25 DIAGNOSIS — Z466 Encounter for fitting and adjustment of urinary device: Secondary | ICD-10-CM | POA: Diagnosis not present

## 2019-10-25 DIAGNOSIS — N184 Chronic kidney disease, stage 4 (severe): Secondary | ICD-10-CM | POA: Diagnosis not present

## 2019-10-27 DIAGNOSIS — Z993 Dependence on wheelchair: Secondary | ICD-10-CM | POA: Diagnosis not present

## 2019-10-27 DIAGNOSIS — N139 Obstructive and reflux uropathy, unspecified: Secondary | ICD-10-CM | POA: Diagnosis not present

## 2019-10-27 DIAGNOSIS — G8111 Spastic hemiplegia affecting right dominant side: Secondary | ICD-10-CM | POA: Diagnosis not present

## 2019-10-27 DIAGNOSIS — I69320 Aphasia following cerebral infarction: Secondary | ICD-10-CM | POA: Diagnosis not present

## 2019-10-27 DIAGNOSIS — G8929 Other chronic pain: Secondary | ICD-10-CM | POA: Diagnosis not present

## 2019-10-27 DIAGNOSIS — E7800 Pure hypercholesterolemia, unspecified: Secondary | ICD-10-CM | POA: Diagnosis not present

## 2019-10-27 DIAGNOSIS — I252 Old myocardial infarction: Secondary | ICD-10-CM | POA: Diagnosis not present

## 2019-10-27 DIAGNOSIS — E119 Type 2 diabetes mellitus without complications: Secondary | ICD-10-CM | POA: Diagnosis not present

## 2019-10-27 DIAGNOSIS — Z466 Encounter for fitting and adjustment of urinary device: Secondary | ICD-10-CM | POA: Diagnosis not present

## 2019-10-27 DIAGNOSIS — I69391 Dysphagia following cerebral infarction: Secondary | ICD-10-CM | POA: Diagnosis not present

## 2019-10-27 DIAGNOSIS — I130 Hypertensive heart and chronic kidney disease with heart failure and stage 1 through stage 4 chronic kidney disease, or unspecified chronic kidney disease: Secondary | ICD-10-CM | POA: Diagnosis not present

## 2019-10-27 DIAGNOSIS — R1310 Dysphagia, unspecified: Secondary | ICD-10-CM | POA: Diagnosis not present

## 2019-10-27 DIAGNOSIS — M419 Scoliosis, unspecified: Secondary | ICD-10-CM | POA: Diagnosis not present

## 2019-10-27 DIAGNOSIS — L89313 Pressure ulcer of right buttock, stage 3: Secondary | ICD-10-CM | POA: Diagnosis not present

## 2019-10-27 DIAGNOSIS — Z9181 History of falling: Secondary | ICD-10-CM | POA: Diagnosis not present

## 2019-10-27 DIAGNOSIS — Z79899 Other long term (current) drug therapy: Secondary | ICD-10-CM | POA: Diagnosis not present

## 2019-10-27 DIAGNOSIS — I5032 Chronic diastolic (congestive) heart failure: Secondary | ICD-10-CM | POA: Diagnosis not present

## 2019-10-27 DIAGNOSIS — Z87440 Personal history of urinary (tract) infections: Secondary | ICD-10-CM | POA: Diagnosis not present

## 2019-10-27 DIAGNOSIS — M1990 Unspecified osteoarthritis, unspecified site: Secondary | ICD-10-CM | POA: Diagnosis not present

## 2019-10-27 DIAGNOSIS — J439 Emphysema, unspecified: Secondary | ICD-10-CM | POA: Diagnosis not present

## 2019-10-27 DIAGNOSIS — D631 Anemia in chronic kidney disease: Secondary | ICD-10-CM | POA: Diagnosis not present

## 2019-10-27 DIAGNOSIS — I639 Cerebral infarction, unspecified: Secondary | ICD-10-CM | POA: Diagnosis not present

## 2019-10-27 DIAGNOSIS — N184 Chronic kidney disease, stage 4 (severe): Secondary | ICD-10-CM | POA: Diagnosis not present

## 2019-10-27 DIAGNOSIS — I69351 Hemiplegia and hemiparesis following cerebral infarction affecting right dominant side: Secondary | ICD-10-CM | POA: Diagnosis not present

## 2019-10-27 DIAGNOSIS — G4733 Obstructive sleep apnea (adult) (pediatric): Secondary | ICD-10-CM | POA: Diagnosis not present

## 2019-10-27 DIAGNOSIS — E1122 Type 2 diabetes mellitus with diabetic chronic kidney disease: Secondary | ICD-10-CM | POA: Diagnosis not present

## 2019-10-27 DIAGNOSIS — I2510 Atherosclerotic heart disease of native coronary artery without angina pectoris: Secondary | ICD-10-CM | POA: Diagnosis not present

## 2019-10-27 DIAGNOSIS — E43 Unspecified severe protein-calorie malnutrition: Secondary | ICD-10-CM | POA: Diagnosis not present

## 2019-10-31 DIAGNOSIS — I130 Hypertensive heart and chronic kidney disease with heart failure and stage 1 through stage 4 chronic kidney disease, or unspecified chronic kidney disease: Secondary | ICD-10-CM | POA: Diagnosis not present

## 2019-10-31 DIAGNOSIS — I2510 Atherosclerotic heart disease of native coronary artery without angina pectoris: Secondary | ICD-10-CM | POA: Diagnosis not present

## 2019-10-31 DIAGNOSIS — Z9181 History of falling: Secondary | ICD-10-CM | POA: Diagnosis not present

## 2019-10-31 DIAGNOSIS — I5032 Chronic diastolic (congestive) heart failure: Secondary | ICD-10-CM | POA: Diagnosis not present

## 2019-10-31 DIAGNOSIS — Z79899 Other long term (current) drug therapy: Secondary | ICD-10-CM | POA: Diagnosis not present

## 2019-10-31 DIAGNOSIS — Z993 Dependence on wheelchair: Secondary | ICD-10-CM | POA: Diagnosis not present

## 2019-10-31 DIAGNOSIS — J439 Emphysema, unspecified: Secondary | ICD-10-CM | POA: Diagnosis not present

## 2019-10-31 DIAGNOSIS — M419 Scoliosis, unspecified: Secondary | ICD-10-CM | POA: Diagnosis not present

## 2019-10-31 DIAGNOSIS — I69351 Hemiplegia and hemiparesis following cerebral infarction affecting right dominant side: Secondary | ICD-10-CM | POA: Diagnosis not present

## 2019-10-31 DIAGNOSIS — I252 Old myocardial infarction: Secondary | ICD-10-CM | POA: Diagnosis not present

## 2019-10-31 DIAGNOSIS — N139 Obstructive and reflux uropathy, unspecified: Secondary | ICD-10-CM | POA: Diagnosis not present

## 2019-10-31 DIAGNOSIS — R1310 Dysphagia, unspecified: Secondary | ICD-10-CM | POA: Diagnosis not present

## 2019-10-31 DIAGNOSIS — I69320 Aphasia following cerebral infarction: Secondary | ICD-10-CM | POA: Diagnosis not present

## 2019-10-31 DIAGNOSIS — Z466 Encounter for fitting and adjustment of urinary device: Secondary | ICD-10-CM | POA: Diagnosis not present

## 2019-10-31 DIAGNOSIS — E7800 Pure hypercholesterolemia, unspecified: Secondary | ICD-10-CM | POA: Diagnosis not present

## 2019-10-31 DIAGNOSIS — G4733 Obstructive sleep apnea (adult) (pediatric): Secondary | ICD-10-CM | POA: Diagnosis not present

## 2019-10-31 DIAGNOSIS — E43 Unspecified severe protein-calorie malnutrition: Secondary | ICD-10-CM | POA: Diagnosis not present

## 2019-10-31 DIAGNOSIS — R339 Retention of urine, unspecified: Secondary | ICD-10-CM | POA: Diagnosis not present

## 2019-10-31 DIAGNOSIS — E1122 Type 2 diabetes mellitus with diabetic chronic kidney disease: Secondary | ICD-10-CM | POA: Diagnosis not present

## 2019-10-31 DIAGNOSIS — N319 Neuromuscular dysfunction of bladder, unspecified: Secondary | ICD-10-CM | POA: Diagnosis not present

## 2019-10-31 DIAGNOSIS — G8929 Other chronic pain: Secondary | ICD-10-CM | POA: Diagnosis not present

## 2019-10-31 DIAGNOSIS — D631 Anemia in chronic kidney disease: Secondary | ICD-10-CM | POA: Diagnosis not present

## 2019-10-31 DIAGNOSIS — N184 Chronic kidney disease, stage 4 (severe): Secondary | ICD-10-CM | POA: Diagnosis not present

## 2019-10-31 DIAGNOSIS — L89313 Pressure ulcer of right buttock, stage 3: Secondary | ICD-10-CM | POA: Diagnosis not present

## 2019-10-31 DIAGNOSIS — M1990 Unspecified osteoarthritis, unspecified site: Secondary | ICD-10-CM | POA: Diagnosis not present

## 2019-10-31 DIAGNOSIS — Z87440 Personal history of urinary (tract) infections: Secondary | ICD-10-CM | POA: Diagnosis not present

## 2019-10-31 DIAGNOSIS — I69391 Dysphagia following cerebral infarction: Secondary | ICD-10-CM | POA: Diagnosis not present

## 2019-11-03 DIAGNOSIS — E7800 Pure hypercholesterolemia, unspecified: Secondary | ICD-10-CM | POA: Diagnosis not present

## 2019-11-03 DIAGNOSIS — L89313 Pressure ulcer of right buttock, stage 3: Secondary | ICD-10-CM | POA: Diagnosis not present

## 2019-11-03 DIAGNOSIS — I252 Old myocardial infarction: Secondary | ICD-10-CM | POA: Diagnosis not present

## 2019-11-03 DIAGNOSIS — E43 Unspecified severe protein-calorie malnutrition: Secondary | ICD-10-CM | POA: Diagnosis not present

## 2019-11-03 DIAGNOSIS — Z466 Encounter for fitting and adjustment of urinary device: Secondary | ICD-10-CM | POA: Diagnosis not present

## 2019-11-03 DIAGNOSIS — N139 Obstructive and reflux uropathy, unspecified: Secondary | ICD-10-CM | POA: Diagnosis not present

## 2019-11-03 DIAGNOSIS — Z87440 Personal history of urinary (tract) infections: Secondary | ICD-10-CM | POA: Diagnosis not present

## 2019-11-03 DIAGNOSIS — I69320 Aphasia following cerebral infarction: Secondary | ICD-10-CM | POA: Diagnosis not present

## 2019-11-03 DIAGNOSIS — J439 Emphysema, unspecified: Secondary | ICD-10-CM | POA: Diagnosis not present

## 2019-11-03 DIAGNOSIS — G4733 Obstructive sleep apnea (adult) (pediatric): Secondary | ICD-10-CM | POA: Diagnosis not present

## 2019-11-03 DIAGNOSIS — G8929 Other chronic pain: Secondary | ICD-10-CM | POA: Diagnosis not present

## 2019-11-03 DIAGNOSIS — N184 Chronic kidney disease, stage 4 (severe): Secondary | ICD-10-CM | POA: Diagnosis not present

## 2019-11-03 DIAGNOSIS — I69351 Hemiplegia and hemiparesis following cerebral infarction affecting right dominant side: Secondary | ICD-10-CM | POA: Diagnosis not present

## 2019-11-03 DIAGNOSIS — I2510 Atherosclerotic heart disease of native coronary artery without angina pectoris: Secondary | ICD-10-CM | POA: Diagnosis not present

## 2019-11-03 DIAGNOSIS — D631 Anemia in chronic kidney disease: Secondary | ICD-10-CM | POA: Diagnosis not present

## 2019-11-03 DIAGNOSIS — M1990 Unspecified osteoarthritis, unspecified site: Secondary | ICD-10-CM | POA: Diagnosis not present

## 2019-11-03 DIAGNOSIS — I5032 Chronic diastolic (congestive) heart failure: Secondary | ICD-10-CM | POA: Diagnosis not present

## 2019-11-03 DIAGNOSIS — Z993 Dependence on wheelchair: Secondary | ICD-10-CM | POA: Diagnosis not present

## 2019-11-03 DIAGNOSIS — I69391 Dysphagia following cerebral infarction: Secondary | ICD-10-CM | POA: Diagnosis not present

## 2019-11-03 DIAGNOSIS — E1122 Type 2 diabetes mellitus with diabetic chronic kidney disease: Secondary | ICD-10-CM | POA: Diagnosis not present

## 2019-11-03 DIAGNOSIS — R1310 Dysphagia, unspecified: Secondary | ICD-10-CM | POA: Diagnosis not present

## 2019-11-03 DIAGNOSIS — Z9181 History of falling: Secondary | ICD-10-CM | POA: Diagnosis not present

## 2019-11-03 DIAGNOSIS — Z79899 Other long term (current) drug therapy: Secondary | ICD-10-CM | POA: Diagnosis not present

## 2019-11-03 DIAGNOSIS — I130 Hypertensive heart and chronic kidney disease with heart failure and stage 1 through stage 4 chronic kidney disease, or unspecified chronic kidney disease: Secondary | ICD-10-CM | POA: Diagnosis not present

## 2019-11-03 DIAGNOSIS — M419 Scoliosis, unspecified: Secondary | ICD-10-CM | POA: Diagnosis not present

## 2019-11-07 DIAGNOSIS — I5032 Chronic diastolic (congestive) heart failure: Secondary | ICD-10-CM | POA: Diagnosis not present

## 2019-11-07 DIAGNOSIS — G4733 Obstructive sleep apnea (adult) (pediatric): Secondary | ICD-10-CM | POA: Diagnosis not present

## 2019-11-07 DIAGNOSIS — Z79899 Other long term (current) drug therapy: Secondary | ICD-10-CM | POA: Diagnosis not present

## 2019-11-07 DIAGNOSIS — I69391 Dysphagia following cerebral infarction: Secondary | ICD-10-CM | POA: Diagnosis not present

## 2019-11-07 DIAGNOSIS — N184 Chronic kidney disease, stage 4 (severe): Secondary | ICD-10-CM | POA: Diagnosis not present

## 2019-11-07 DIAGNOSIS — E7800 Pure hypercholesterolemia, unspecified: Secondary | ICD-10-CM | POA: Diagnosis not present

## 2019-11-07 DIAGNOSIS — M419 Scoliosis, unspecified: Secondary | ICD-10-CM | POA: Diagnosis not present

## 2019-11-07 DIAGNOSIS — Z993 Dependence on wheelchair: Secondary | ICD-10-CM | POA: Diagnosis not present

## 2019-11-07 DIAGNOSIS — I69351 Hemiplegia and hemiparesis following cerebral infarction affecting right dominant side: Secondary | ICD-10-CM | POA: Diagnosis not present

## 2019-11-07 DIAGNOSIS — R1310 Dysphagia, unspecified: Secondary | ICD-10-CM | POA: Diagnosis not present

## 2019-11-07 DIAGNOSIS — Z466 Encounter for fitting and adjustment of urinary device: Secondary | ICD-10-CM | POA: Diagnosis not present

## 2019-11-07 DIAGNOSIS — D631 Anemia in chronic kidney disease: Secondary | ICD-10-CM | POA: Diagnosis not present

## 2019-11-07 DIAGNOSIS — G8929 Other chronic pain: Secondary | ICD-10-CM | POA: Diagnosis not present

## 2019-11-07 DIAGNOSIS — I252 Old myocardial infarction: Secondary | ICD-10-CM | POA: Diagnosis not present

## 2019-11-07 DIAGNOSIS — I2510 Atherosclerotic heart disease of native coronary artery without angina pectoris: Secondary | ICD-10-CM | POA: Diagnosis not present

## 2019-11-07 DIAGNOSIS — E1122 Type 2 diabetes mellitus with diabetic chronic kidney disease: Secondary | ICD-10-CM | POA: Diagnosis not present

## 2019-11-07 DIAGNOSIS — Z87440 Personal history of urinary (tract) infections: Secondary | ICD-10-CM | POA: Diagnosis not present

## 2019-11-07 DIAGNOSIS — I130 Hypertensive heart and chronic kidney disease with heart failure and stage 1 through stage 4 chronic kidney disease, or unspecified chronic kidney disease: Secondary | ICD-10-CM | POA: Diagnosis not present

## 2019-11-07 DIAGNOSIS — N139 Obstructive and reflux uropathy, unspecified: Secondary | ICD-10-CM | POA: Diagnosis not present

## 2019-11-07 DIAGNOSIS — L89313 Pressure ulcer of right buttock, stage 3: Secondary | ICD-10-CM | POA: Diagnosis not present

## 2019-11-07 DIAGNOSIS — Z9181 History of falling: Secondary | ICD-10-CM | POA: Diagnosis not present

## 2019-11-07 DIAGNOSIS — M1990 Unspecified osteoarthritis, unspecified site: Secondary | ICD-10-CM | POA: Diagnosis not present

## 2019-11-07 DIAGNOSIS — J439 Emphysema, unspecified: Secondary | ICD-10-CM | POA: Diagnosis not present

## 2019-11-07 DIAGNOSIS — I69320 Aphasia following cerebral infarction: Secondary | ICD-10-CM | POA: Diagnosis not present

## 2019-11-07 DIAGNOSIS — E43 Unspecified severe protein-calorie malnutrition: Secondary | ICD-10-CM | POA: Diagnosis not present

## 2019-11-10 DIAGNOSIS — M419 Scoliosis, unspecified: Secondary | ICD-10-CM | POA: Diagnosis not present

## 2019-11-10 DIAGNOSIS — D631 Anemia in chronic kidney disease: Secondary | ICD-10-CM | POA: Diagnosis not present

## 2019-11-10 DIAGNOSIS — Z466 Encounter for fitting and adjustment of urinary device: Secondary | ICD-10-CM | POA: Diagnosis not present

## 2019-11-10 DIAGNOSIS — R1310 Dysphagia, unspecified: Secondary | ICD-10-CM | POA: Diagnosis not present

## 2019-11-10 DIAGNOSIS — E1122 Type 2 diabetes mellitus with diabetic chronic kidney disease: Secondary | ICD-10-CM | POA: Diagnosis not present

## 2019-11-10 DIAGNOSIS — I2510 Atherosclerotic heart disease of native coronary artery without angina pectoris: Secondary | ICD-10-CM | POA: Diagnosis not present

## 2019-11-10 DIAGNOSIS — M1990 Unspecified osteoarthritis, unspecified site: Secondary | ICD-10-CM | POA: Diagnosis not present

## 2019-11-10 DIAGNOSIS — L89313 Pressure ulcer of right buttock, stage 3: Secondary | ICD-10-CM | POA: Diagnosis not present

## 2019-11-10 DIAGNOSIS — I130 Hypertensive heart and chronic kidney disease with heart failure and stage 1 through stage 4 chronic kidney disease, or unspecified chronic kidney disease: Secondary | ICD-10-CM | POA: Diagnosis not present

## 2019-11-10 DIAGNOSIS — N139 Obstructive and reflux uropathy, unspecified: Secondary | ICD-10-CM | POA: Diagnosis not present

## 2019-11-10 DIAGNOSIS — Z87440 Personal history of urinary (tract) infections: Secondary | ICD-10-CM | POA: Diagnosis not present

## 2019-11-10 DIAGNOSIS — E7800 Pure hypercholesterolemia, unspecified: Secondary | ICD-10-CM | POA: Diagnosis not present

## 2019-11-10 DIAGNOSIS — J439 Emphysema, unspecified: Secondary | ICD-10-CM | POA: Diagnosis not present

## 2019-11-10 DIAGNOSIS — I69351 Hemiplegia and hemiparesis following cerebral infarction affecting right dominant side: Secondary | ICD-10-CM | POA: Diagnosis not present

## 2019-11-10 DIAGNOSIS — N184 Chronic kidney disease, stage 4 (severe): Secondary | ICD-10-CM | POA: Diagnosis not present

## 2019-11-10 DIAGNOSIS — I69320 Aphasia following cerebral infarction: Secondary | ICD-10-CM | POA: Diagnosis not present

## 2019-11-10 DIAGNOSIS — I69391 Dysphagia following cerebral infarction: Secondary | ICD-10-CM | POA: Diagnosis not present

## 2019-11-10 DIAGNOSIS — I252 Old myocardial infarction: Secondary | ICD-10-CM | POA: Diagnosis not present

## 2019-11-10 DIAGNOSIS — Z993 Dependence on wheelchair: Secondary | ICD-10-CM | POA: Diagnosis not present

## 2019-11-10 DIAGNOSIS — Z79899 Other long term (current) drug therapy: Secondary | ICD-10-CM | POA: Diagnosis not present

## 2019-11-10 DIAGNOSIS — E43 Unspecified severe protein-calorie malnutrition: Secondary | ICD-10-CM | POA: Diagnosis not present

## 2019-11-10 DIAGNOSIS — G8929 Other chronic pain: Secondary | ICD-10-CM | POA: Diagnosis not present

## 2019-11-10 DIAGNOSIS — I5032 Chronic diastolic (congestive) heart failure: Secondary | ICD-10-CM | POA: Diagnosis not present

## 2019-11-10 DIAGNOSIS — G4733 Obstructive sleep apnea (adult) (pediatric): Secondary | ICD-10-CM | POA: Diagnosis not present

## 2019-11-10 DIAGNOSIS — Z9181 History of falling: Secondary | ICD-10-CM | POA: Diagnosis not present

## 2019-11-14 DIAGNOSIS — I69351 Hemiplegia and hemiparesis following cerebral infarction affecting right dominant side: Secondary | ICD-10-CM | POA: Diagnosis not present

## 2019-11-14 DIAGNOSIS — D631 Anemia in chronic kidney disease: Secondary | ICD-10-CM | POA: Diagnosis not present

## 2019-11-14 DIAGNOSIS — I69320 Aphasia following cerebral infarction: Secondary | ICD-10-CM | POA: Diagnosis not present

## 2019-11-14 DIAGNOSIS — I69391 Dysphagia following cerebral infarction: Secondary | ICD-10-CM | POA: Diagnosis not present

## 2019-11-14 DIAGNOSIS — E43 Unspecified severe protein-calorie malnutrition: Secondary | ICD-10-CM | POA: Diagnosis not present

## 2019-11-14 DIAGNOSIS — E1122 Type 2 diabetes mellitus with diabetic chronic kidney disease: Secondary | ICD-10-CM | POA: Diagnosis not present

## 2019-11-14 DIAGNOSIS — I2510 Atherosclerotic heart disease of native coronary artery without angina pectoris: Secondary | ICD-10-CM | POA: Diagnosis not present

## 2019-11-14 DIAGNOSIS — Z466 Encounter for fitting and adjustment of urinary device: Secondary | ICD-10-CM | POA: Diagnosis not present

## 2019-11-14 DIAGNOSIS — N139 Obstructive and reflux uropathy, unspecified: Secondary | ICD-10-CM | POA: Diagnosis not present

## 2019-11-14 DIAGNOSIS — E7800 Pure hypercholesterolemia, unspecified: Secondary | ICD-10-CM | POA: Diagnosis not present

## 2019-11-14 DIAGNOSIS — I130 Hypertensive heart and chronic kidney disease with heart failure and stage 1 through stage 4 chronic kidney disease, or unspecified chronic kidney disease: Secondary | ICD-10-CM | POA: Diagnosis not present

## 2019-11-14 DIAGNOSIS — I252 Old myocardial infarction: Secondary | ICD-10-CM | POA: Diagnosis not present

## 2019-11-14 DIAGNOSIS — N184 Chronic kidney disease, stage 4 (severe): Secondary | ICD-10-CM | POA: Diagnosis not present

## 2019-11-14 DIAGNOSIS — R1310 Dysphagia, unspecified: Secondary | ICD-10-CM | POA: Diagnosis not present

## 2019-11-14 DIAGNOSIS — G4733 Obstructive sleep apnea (adult) (pediatric): Secondary | ICD-10-CM | POA: Diagnosis not present

## 2019-11-14 DIAGNOSIS — Z993 Dependence on wheelchair: Secondary | ICD-10-CM | POA: Diagnosis not present

## 2019-11-14 DIAGNOSIS — G8929 Other chronic pain: Secondary | ICD-10-CM | POA: Diagnosis not present

## 2019-11-14 DIAGNOSIS — J439 Emphysema, unspecified: Secondary | ICD-10-CM | POA: Diagnosis not present

## 2019-11-14 DIAGNOSIS — L89313 Pressure ulcer of right buttock, stage 3: Secondary | ICD-10-CM | POA: Diagnosis not present

## 2019-11-14 DIAGNOSIS — M1990 Unspecified osteoarthritis, unspecified site: Secondary | ICD-10-CM | POA: Diagnosis not present

## 2019-11-14 DIAGNOSIS — Z87440 Personal history of urinary (tract) infections: Secondary | ICD-10-CM | POA: Diagnosis not present

## 2019-11-14 DIAGNOSIS — M419 Scoliosis, unspecified: Secondary | ICD-10-CM | POA: Diagnosis not present

## 2019-11-14 DIAGNOSIS — Z79899 Other long term (current) drug therapy: Secondary | ICD-10-CM | POA: Diagnosis not present

## 2019-11-14 DIAGNOSIS — Z9181 History of falling: Secondary | ICD-10-CM | POA: Diagnosis not present

## 2019-11-14 DIAGNOSIS — I5032 Chronic diastolic (congestive) heart failure: Secondary | ICD-10-CM | POA: Diagnosis not present

## 2019-11-17 DIAGNOSIS — G894 Chronic pain syndrome: Secondary | ICD-10-CM | POA: Diagnosis not present

## 2019-11-17 DIAGNOSIS — E118 Type 2 diabetes mellitus with unspecified complications: Secondary | ICD-10-CM | POA: Diagnosis not present

## 2019-11-17 DIAGNOSIS — L89159 Pressure ulcer of sacral region, unspecified stage: Secondary | ICD-10-CM | POA: Diagnosis not present

## 2019-11-17 DIAGNOSIS — I10 Essential (primary) hypertension: Secondary | ICD-10-CM | POA: Diagnosis not present

## 2019-11-17 DIAGNOSIS — N390 Urinary tract infection, site not specified: Secondary | ICD-10-CM | POA: Diagnosis not present

## 2019-11-21 DIAGNOSIS — Z9181 History of falling: Secondary | ICD-10-CM | POA: Diagnosis not present

## 2019-11-21 DIAGNOSIS — N139 Obstructive and reflux uropathy, unspecified: Secondary | ICD-10-CM | POA: Diagnosis not present

## 2019-11-21 DIAGNOSIS — I69351 Hemiplegia and hemiparesis following cerebral infarction affecting right dominant side: Secondary | ICD-10-CM | POA: Diagnosis not present

## 2019-11-21 DIAGNOSIS — G8929 Other chronic pain: Secondary | ICD-10-CM | POA: Diagnosis not present

## 2019-11-21 DIAGNOSIS — I2510 Atherosclerotic heart disease of native coronary artery without angina pectoris: Secondary | ICD-10-CM | POA: Diagnosis not present

## 2019-11-21 DIAGNOSIS — Z993 Dependence on wheelchair: Secondary | ICD-10-CM | POA: Diagnosis not present

## 2019-11-21 DIAGNOSIS — E1122 Type 2 diabetes mellitus with diabetic chronic kidney disease: Secondary | ICD-10-CM | POA: Diagnosis not present

## 2019-11-21 DIAGNOSIS — E7800 Pure hypercholesterolemia, unspecified: Secondary | ICD-10-CM | POA: Diagnosis not present

## 2019-11-21 DIAGNOSIS — J439 Emphysema, unspecified: Secondary | ICD-10-CM | POA: Diagnosis not present

## 2019-11-21 DIAGNOSIS — Z466 Encounter for fitting and adjustment of urinary device: Secondary | ICD-10-CM | POA: Diagnosis not present

## 2019-11-21 DIAGNOSIS — D631 Anemia in chronic kidney disease: Secondary | ICD-10-CM | POA: Diagnosis not present

## 2019-11-21 DIAGNOSIS — M419 Scoliosis, unspecified: Secondary | ICD-10-CM | POA: Diagnosis not present

## 2019-11-21 DIAGNOSIS — I252 Old myocardial infarction: Secondary | ICD-10-CM | POA: Diagnosis not present

## 2019-11-21 DIAGNOSIS — I69391 Dysphagia following cerebral infarction: Secondary | ICD-10-CM | POA: Diagnosis not present

## 2019-11-21 DIAGNOSIS — R1310 Dysphagia, unspecified: Secondary | ICD-10-CM | POA: Diagnosis not present

## 2019-11-21 DIAGNOSIS — E43 Unspecified severe protein-calorie malnutrition: Secondary | ICD-10-CM | POA: Diagnosis not present

## 2019-11-21 DIAGNOSIS — I69320 Aphasia following cerebral infarction: Secondary | ICD-10-CM | POA: Diagnosis not present

## 2019-11-21 DIAGNOSIS — M1990 Unspecified osteoarthritis, unspecified site: Secondary | ICD-10-CM | POA: Diagnosis not present

## 2019-11-21 DIAGNOSIS — I5032 Chronic diastolic (congestive) heart failure: Secondary | ICD-10-CM | POA: Diagnosis not present

## 2019-11-21 DIAGNOSIS — L89313 Pressure ulcer of right buttock, stage 3: Secondary | ICD-10-CM | POA: Diagnosis not present

## 2019-11-21 DIAGNOSIS — Z87440 Personal history of urinary (tract) infections: Secondary | ICD-10-CM | POA: Diagnosis not present

## 2019-11-21 DIAGNOSIS — N184 Chronic kidney disease, stage 4 (severe): Secondary | ICD-10-CM | POA: Diagnosis not present

## 2019-11-21 DIAGNOSIS — Z79899 Other long term (current) drug therapy: Secondary | ICD-10-CM | POA: Diagnosis not present

## 2019-11-21 DIAGNOSIS — I130 Hypertensive heart and chronic kidney disease with heart failure and stage 1 through stage 4 chronic kidney disease, or unspecified chronic kidney disease: Secondary | ICD-10-CM | POA: Diagnosis not present

## 2019-11-21 DIAGNOSIS — G4733 Obstructive sleep apnea (adult) (pediatric): Secondary | ICD-10-CM | POA: Diagnosis not present

## 2019-11-22 DIAGNOSIS — J449 Chronic obstructive pulmonary disease, unspecified: Secondary | ICD-10-CM | POA: Diagnosis not present

## 2019-11-23 DIAGNOSIS — L89313 Pressure ulcer of right buttock, stage 3: Secondary | ICD-10-CM | POA: Diagnosis not present

## 2019-11-24 DIAGNOSIS — I252 Old myocardial infarction: Secondary | ICD-10-CM | POA: Diagnosis not present

## 2019-11-24 DIAGNOSIS — M419 Scoliosis, unspecified: Secondary | ICD-10-CM | POA: Diagnosis not present

## 2019-11-24 DIAGNOSIS — Z87440 Personal history of urinary (tract) infections: Secondary | ICD-10-CM | POA: Diagnosis not present

## 2019-11-24 DIAGNOSIS — Z9181 History of falling: Secondary | ICD-10-CM | POA: Diagnosis not present

## 2019-11-24 DIAGNOSIS — G8929 Other chronic pain: Secondary | ICD-10-CM | POA: Diagnosis not present

## 2019-11-24 DIAGNOSIS — I130 Hypertensive heart and chronic kidney disease with heart failure and stage 1 through stage 4 chronic kidney disease, or unspecified chronic kidney disease: Secondary | ICD-10-CM | POA: Diagnosis not present

## 2019-11-24 DIAGNOSIS — R1310 Dysphagia, unspecified: Secondary | ICD-10-CM | POA: Diagnosis not present

## 2019-11-24 DIAGNOSIS — D631 Anemia in chronic kidney disease: Secondary | ICD-10-CM | POA: Diagnosis not present

## 2019-11-24 DIAGNOSIS — I5032 Chronic diastolic (congestive) heart failure: Secondary | ICD-10-CM | POA: Diagnosis not present

## 2019-11-24 DIAGNOSIS — I69351 Hemiplegia and hemiparesis following cerebral infarction affecting right dominant side: Secondary | ICD-10-CM | POA: Diagnosis not present

## 2019-11-24 DIAGNOSIS — Z466 Encounter for fitting and adjustment of urinary device: Secondary | ICD-10-CM | POA: Diagnosis not present

## 2019-11-24 DIAGNOSIS — J439 Emphysema, unspecified: Secondary | ICD-10-CM | POA: Diagnosis not present

## 2019-11-24 DIAGNOSIS — L89313 Pressure ulcer of right buttock, stage 3: Secondary | ICD-10-CM | POA: Diagnosis not present

## 2019-11-24 DIAGNOSIS — I2510 Atherosclerotic heart disease of native coronary artery without angina pectoris: Secondary | ICD-10-CM | POA: Diagnosis not present

## 2019-11-24 DIAGNOSIS — G4733 Obstructive sleep apnea (adult) (pediatric): Secondary | ICD-10-CM | POA: Diagnosis not present

## 2019-11-24 DIAGNOSIS — M1990 Unspecified osteoarthritis, unspecified site: Secondary | ICD-10-CM | POA: Diagnosis not present

## 2019-11-24 DIAGNOSIS — N184 Chronic kidney disease, stage 4 (severe): Secondary | ICD-10-CM | POA: Diagnosis not present

## 2019-11-24 DIAGNOSIS — E7800 Pure hypercholesterolemia, unspecified: Secondary | ICD-10-CM | POA: Diagnosis not present

## 2019-11-24 DIAGNOSIS — I69320 Aphasia following cerebral infarction: Secondary | ICD-10-CM | POA: Diagnosis not present

## 2019-11-24 DIAGNOSIS — E43 Unspecified severe protein-calorie malnutrition: Secondary | ICD-10-CM | POA: Diagnosis not present

## 2019-11-24 DIAGNOSIS — Z79899 Other long term (current) drug therapy: Secondary | ICD-10-CM | POA: Diagnosis not present

## 2019-11-24 DIAGNOSIS — N139 Obstructive and reflux uropathy, unspecified: Secondary | ICD-10-CM | POA: Diagnosis not present

## 2019-11-24 DIAGNOSIS — Z993 Dependence on wheelchair: Secondary | ICD-10-CM | POA: Diagnosis not present

## 2019-11-24 DIAGNOSIS — I69391 Dysphagia following cerebral infarction: Secondary | ICD-10-CM | POA: Diagnosis not present

## 2019-11-24 DIAGNOSIS — E1122 Type 2 diabetes mellitus with diabetic chronic kidney disease: Secondary | ICD-10-CM | POA: Diagnosis not present

## 2019-11-27 DIAGNOSIS — G8111 Spastic hemiplegia affecting right dominant side: Secondary | ICD-10-CM | POA: Diagnosis not present

## 2019-11-27 DIAGNOSIS — E119 Type 2 diabetes mellitus without complications: Secondary | ICD-10-CM | POA: Diagnosis not present

## 2019-11-27 DIAGNOSIS — I639 Cerebral infarction, unspecified: Secondary | ICD-10-CM | POA: Diagnosis not present

## 2019-11-28 DIAGNOSIS — I2510 Atherosclerotic heart disease of native coronary artery without angina pectoris: Secondary | ICD-10-CM | POA: Diagnosis not present

## 2019-11-28 DIAGNOSIS — I252 Old myocardial infarction: Secondary | ICD-10-CM | POA: Diagnosis not present

## 2019-11-28 DIAGNOSIS — M1990 Unspecified osteoarthritis, unspecified site: Secondary | ICD-10-CM | POA: Diagnosis not present

## 2019-11-28 DIAGNOSIS — N184 Chronic kidney disease, stage 4 (severe): Secondary | ICD-10-CM | POA: Diagnosis not present

## 2019-11-28 DIAGNOSIS — Z79899 Other long term (current) drug therapy: Secondary | ICD-10-CM | POA: Diagnosis not present

## 2019-11-28 DIAGNOSIS — E7800 Pure hypercholesterolemia, unspecified: Secondary | ICD-10-CM | POA: Diagnosis not present

## 2019-11-28 DIAGNOSIS — Z87440 Personal history of urinary (tract) infections: Secondary | ICD-10-CM | POA: Diagnosis not present

## 2019-11-28 DIAGNOSIS — I5032 Chronic diastolic (congestive) heart failure: Secondary | ICD-10-CM | POA: Diagnosis not present

## 2019-11-28 DIAGNOSIS — N139 Obstructive and reflux uropathy, unspecified: Secondary | ICD-10-CM | POA: Diagnosis not present

## 2019-11-28 DIAGNOSIS — L89313 Pressure ulcer of right buttock, stage 3: Secondary | ICD-10-CM | POA: Diagnosis not present

## 2019-11-28 DIAGNOSIS — E43 Unspecified severe protein-calorie malnutrition: Secondary | ICD-10-CM | POA: Diagnosis not present

## 2019-11-28 DIAGNOSIS — G4733 Obstructive sleep apnea (adult) (pediatric): Secondary | ICD-10-CM | POA: Diagnosis not present

## 2019-11-28 DIAGNOSIS — M419 Scoliosis, unspecified: Secondary | ICD-10-CM | POA: Diagnosis not present

## 2019-11-28 DIAGNOSIS — E1122 Type 2 diabetes mellitus with diabetic chronic kidney disease: Secondary | ICD-10-CM | POA: Diagnosis not present

## 2019-11-28 DIAGNOSIS — Z466 Encounter for fitting and adjustment of urinary device: Secondary | ICD-10-CM | POA: Diagnosis not present

## 2019-11-28 DIAGNOSIS — I69320 Aphasia following cerebral infarction: Secondary | ICD-10-CM | POA: Diagnosis not present

## 2019-11-28 DIAGNOSIS — Z993 Dependence on wheelchair: Secondary | ICD-10-CM | POA: Diagnosis not present

## 2019-11-28 DIAGNOSIS — I130 Hypertensive heart and chronic kidney disease with heart failure and stage 1 through stage 4 chronic kidney disease, or unspecified chronic kidney disease: Secondary | ICD-10-CM | POA: Diagnosis not present

## 2019-11-28 DIAGNOSIS — I69351 Hemiplegia and hemiparesis following cerebral infarction affecting right dominant side: Secondary | ICD-10-CM | POA: Diagnosis not present

## 2019-11-28 DIAGNOSIS — D631 Anemia in chronic kidney disease: Secondary | ICD-10-CM | POA: Diagnosis not present

## 2019-11-28 DIAGNOSIS — G8929 Other chronic pain: Secondary | ICD-10-CM | POA: Diagnosis not present

## 2019-11-28 DIAGNOSIS — J439 Emphysema, unspecified: Secondary | ICD-10-CM | POA: Diagnosis not present

## 2019-11-28 DIAGNOSIS — R1310 Dysphagia, unspecified: Secondary | ICD-10-CM | POA: Diagnosis not present

## 2019-11-28 DIAGNOSIS — Z9181 History of falling: Secondary | ICD-10-CM | POA: Diagnosis not present

## 2019-11-28 DIAGNOSIS — I69391 Dysphagia following cerebral infarction: Secondary | ICD-10-CM | POA: Diagnosis not present

## 2019-12-04 DIAGNOSIS — N319 Neuromuscular dysfunction of bladder, unspecified: Secondary | ICD-10-CM | POA: Diagnosis not present

## 2019-12-04 DIAGNOSIS — R339 Retention of urine, unspecified: Secondary | ICD-10-CM | POA: Diagnosis not present

## 2019-12-04 DIAGNOSIS — Z466 Encounter for fitting and adjustment of urinary device: Secondary | ICD-10-CM | POA: Diagnosis not present

## 2019-12-07 DIAGNOSIS — Z87440 Personal history of urinary (tract) infections: Secondary | ICD-10-CM | POA: Diagnosis not present

## 2019-12-07 DIAGNOSIS — E1122 Type 2 diabetes mellitus with diabetic chronic kidney disease: Secondary | ICD-10-CM | POA: Diagnosis not present

## 2019-12-07 DIAGNOSIS — Z993 Dependence on wheelchair: Secondary | ICD-10-CM | POA: Diagnosis not present

## 2019-12-07 DIAGNOSIS — I2510 Atherosclerotic heart disease of native coronary artery without angina pectoris: Secondary | ICD-10-CM | POA: Diagnosis not present

## 2019-12-07 DIAGNOSIS — D631 Anemia in chronic kidney disease: Secondary | ICD-10-CM | POA: Diagnosis not present

## 2019-12-07 DIAGNOSIS — E7800 Pure hypercholesterolemia, unspecified: Secondary | ICD-10-CM | POA: Diagnosis not present

## 2019-12-07 DIAGNOSIS — I130 Hypertensive heart and chronic kidney disease with heart failure and stage 1 through stage 4 chronic kidney disease, or unspecified chronic kidney disease: Secondary | ICD-10-CM | POA: Diagnosis not present

## 2019-12-07 DIAGNOSIS — G8929 Other chronic pain: Secondary | ICD-10-CM | POA: Diagnosis not present

## 2019-12-07 DIAGNOSIS — J439 Emphysema, unspecified: Secondary | ICD-10-CM | POA: Diagnosis not present

## 2019-12-07 DIAGNOSIS — I5032 Chronic diastolic (congestive) heart failure: Secondary | ICD-10-CM | POA: Diagnosis not present

## 2019-12-07 DIAGNOSIS — Z79899 Other long term (current) drug therapy: Secondary | ICD-10-CM | POA: Diagnosis not present

## 2019-12-07 DIAGNOSIS — M1990 Unspecified osteoarthritis, unspecified site: Secondary | ICD-10-CM | POA: Diagnosis not present

## 2019-12-07 DIAGNOSIS — N139 Obstructive and reflux uropathy, unspecified: Secondary | ICD-10-CM | POA: Diagnosis not present

## 2019-12-07 DIAGNOSIS — R1310 Dysphagia, unspecified: Secondary | ICD-10-CM | POA: Diagnosis not present

## 2019-12-07 DIAGNOSIS — M419 Scoliosis, unspecified: Secondary | ICD-10-CM | POA: Diagnosis not present

## 2019-12-07 DIAGNOSIS — Z466 Encounter for fitting and adjustment of urinary device: Secondary | ICD-10-CM | POA: Diagnosis not present

## 2019-12-07 DIAGNOSIS — L89313 Pressure ulcer of right buttock, stage 3: Secondary | ICD-10-CM | POA: Diagnosis not present

## 2019-12-07 DIAGNOSIS — N184 Chronic kidney disease, stage 4 (severe): Secondary | ICD-10-CM | POA: Diagnosis not present

## 2019-12-07 DIAGNOSIS — I252 Old myocardial infarction: Secondary | ICD-10-CM | POA: Diagnosis not present

## 2019-12-07 DIAGNOSIS — I69391 Dysphagia following cerebral infarction: Secondary | ICD-10-CM | POA: Diagnosis not present

## 2019-12-07 DIAGNOSIS — E43 Unspecified severe protein-calorie malnutrition: Secondary | ICD-10-CM | POA: Diagnosis not present

## 2019-12-07 DIAGNOSIS — Z9181 History of falling: Secondary | ICD-10-CM | POA: Diagnosis not present

## 2019-12-07 DIAGNOSIS — G4733 Obstructive sleep apnea (adult) (pediatric): Secondary | ICD-10-CM | POA: Diagnosis not present

## 2019-12-07 DIAGNOSIS — I69351 Hemiplegia and hemiparesis following cerebral infarction affecting right dominant side: Secondary | ICD-10-CM | POA: Diagnosis not present

## 2019-12-07 DIAGNOSIS — I69320 Aphasia following cerebral infarction: Secondary | ICD-10-CM | POA: Diagnosis not present

## 2019-12-12 DIAGNOSIS — E43 Unspecified severe protein-calorie malnutrition: Secondary | ICD-10-CM | POA: Diagnosis not present

## 2019-12-12 DIAGNOSIS — Z466 Encounter for fitting and adjustment of urinary device: Secondary | ICD-10-CM | POA: Diagnosis not present

## 2019-12-12 DIAGNOSIS — L89313 Pressure ulcer of right buttock, stage 3: Secondary | ICD-10-CM | POA: Diagnosis not present

## 2019-12-12 DIAGNOSIS — I252 Old myocardial infarction: Secondary | ICD-10-CM | POA: Diagnosis not present

## 2019-12-12 DIAGNOSIS — M1990 Unspecified osteoarthritis, unspecified site: Secondary | ICD-10-CM | POA: Diagnosis not present

## 2019-12-12 DIAGNOSIS — Z87440 Personal history of urinary (tract) infections: Secondary | ICD-10-CM | POA: Diagnosis not present

## 2019-12-12 DIAGNOSIS — I130 Hypertensive heart and chronic kidney disease with heart failure and stage 1 through stage 4 chronic kidney disease, or unspecified chronic kidney disease: Secondary | ICD-10-CM | POA: Diagnosis not present

## 2019-12-12 DIAGNOSIS — G4733 Obstructive sleep apnea (adult) (pediatric): Secondary | ICD-10-CM | POA: Diagnosis not present

## 2019-12-12 DIAGNOSIS — I69351 Hemiplegia and hemiparesis following cerebral infarction affecting right dominant side: Secondary | ICD-10-CM | POA: Diagnosis not present

## 2019-12-12 DIAGNOSIS — Z9181 History of falling: Secondary | ICD-10-CM | POA: Diagnosis not present

## 2019-12-12 DIAGNOSIS — I69391 Dysphagia following cerebral infarction: Secondary | ICD-10-CM | POA: Diagnosis not present

## 2019-12-12 DIAGNOSIS — D631 Anemia in chronic kidney disease: Secondary | ICD-10-CM | POA: Diagnosis not present

## 2019-12-12 DIAGNOSIS — M419 Scoliosis, unspecified: Secondary | ICD-10-CM | POA: Diagnosis not present

## 2019-12-12 DIAGNOSIS — N184 Chronic kidney disease, stage 4 (severe): Secondary | ICD-10-CM | POA: Diagnosis not present

## 2019-12-12 DIAGNOSIS — Z993 Dependence on wheelchair: Secondary | ICD-10-CM | POA: Diagnosis not present

## 2019-12-12 DIAGNOSIS — I2510 Atherosclerotic heart disease of native coronary artery without angina pectoris: Secondary | ICD-10-CM | POA: Diagnosis not present

## 2019-12-12 DIAGNOSIS — I69320 Aphasia following cerebral infarction: Secondary | ICD-10-CM | POA: Diagnosis not present

## 2019-12-12 DIAGNOSIS — J439 Emphysema, unspecified: Secondary | ICD-10-CM | POA: Diagnosis not present

## 2019-12-12 DIAGNOSIS — G8929 Other chronic pain: Secondary | ICD-10-CM | POA: Diagnosis not present

## 2019-12-12 DIAGNOSIS — E1122 Type 2 diabetes mellitus with diabetic chronic kidney disease: Secondary | ICD-10-CM | POA: Diagnosis not present

## 2019-12-12 DIAGNOSIS — N139 Obstructive and reflux uropathy, unspecified: Secondary | ICD-10-CM | POA: Diagnosis not present

## 2019-12-12 DIAGNOSIS — Z79899 Other long term (current) drug therapy: Secondary | ICD-10-CM | POA: Diagnosis not present

## 2019-12-12 DIAGNOSIS — R1310 Dysphagia, unspecified: Secondary | ICD-10-CM | POA: Diagnosis not present

## 2019-12-12 DIAGNOSIS — I5032 Chronic diastolic (congestive) heart failure: Secondary | ICD-10-CM | POA: Diagnosis not present

## 2019-12-12 DIAGNOSIS — E7800 Pure hypercholesterolemia, unspecified: Secondary | ICD-10-CM | POA: Diagnosis not present

## 2019-12-13 DIAGNOSIS — L89313 Pressure ulcer of right buttock, stage 3: Secondary | ICD-10-CM | POA: Diagnosis not present

## 2019-12-14 DIAGNOSIS — N139 Obstructive and reflux uropathy, unspecified: Secondary | ICD-10-CM | POA: Diagnosis not present

## 2019-12-14 DIAGNOSIS — N184 Chronic kidney disease, stage 4 (severe): Secondary | ICD-10-CM | POA: Diagnosis not present

## 2019-12-14 DIAGNOSIS — M542 Cervicalgia: Secondary | ICD-10-CM | POA: Diagnosis not present

## 2019-12-14 DIAGNOSIS — Z79899 Other long term (current) drug therapy: Secondary | ICD-10-CM | POA: Diagnosis not present

## 2019-12-14 DIAGNOSIS — D631 Anemia in chronic kidney disease: Secondary | ICD-10-CM | POA: Diagnosis not present

## 2019-12-14 DIAGNOSIS — M1990 Unspecified osteoarthritis, unspecified site: Secondary | ICD-10-CM | POA: Diagnosis not present

## 2019-12-14 DIAGNOSIS — M79605 Pain in left leg: Secondary | ICD-10-CM | POA: Diagnosis not present

## 2019-12-14 DIAGNOSIS — M419 Scoliosis, unspecified: Secondary | ICD-10-CM | POA: Diagnosis not present

## 2019-12-14 DIAGNOSIS — I69351 Hemiplegia and hemiparesis following cerebral infarction affecting right dominant side: Secondary | ICD-10-CM | POA: Diagnosis not present

## 2019-12-14 DIAGNOSIS — I252 Old myocardial infarction: Secondary | ICD-10-CM | POA: Diagnosis not present

## 2019-12-14 DIAGNOSIS — E1122 Type 2 diabetes mellitus with diabetic chronic kidney disease: Secondary | ICD-10-CM | POA: Diagnosis not present

## 2019-12-14 DIAGNOSIS — L89313 Pressure ulcer of right buttock, stage 3: Secondary | ICD-10-CM | POA: Diagnosis not present

## 2019-12-14 DIAGNOSIS — J439 Emphysema, unspecified: Secondary | ICD-10-CM | POA: Diagnosis not present

## 2019-12-14 DIAGNOSIS — R1310 Dysphagia, unspecified: Secondary | ICD-10-CM | POA: Diagnosis not present

## 2019-12-14 DIAGNOSIS — I69320 Aphasia following cerebral infarction: Secondary | ICD-10-CM | POA: Diagnosis not present

## 2019-12-14 DIAGNOSIS — G894 Chronic pain syndrome: Secondary | ICD-10-CM | POA: Diagnosis not present

## 2019-12-14 DIAGNOSIS — E7800 Pure hypercholesterolemia, unspecified: Secondary | ICD-10-CM | POA: Diagnosis not present

## 2019-12-14 DIAGNOSIS — I2510 Atherosclerotic heart disease of native coronary artery without angina pectoris: Secondary | ICD-10-CM | POA: Diagnosis not present

## 2019-12-14 DIAGNOSIS — Z79891 Long term (current) use of opiate analgesic: Secondary | ICD-10-CM | POA: Diagnosis not present

## 2019-12-14 DIAGNOSIS — I69391 Dysphagia following cerebral infarction: Secondary | ICD-10-CM | POA: Diagnosis not present

## 2019-12-14 DIAGNOSIS — Z9181 History of falling: Secondary | ICD-10-CM | POA: Diagnosis not present

## 2019-12-14 DIAGNOSIS — I130 Hypertensive heart and chronic kidney disease with heart failure and stage 1 through stage 4 chronic kidney disease, or unspecified chronic kidney disease: Secondary | ICD-10-CM | POA: Diagnosis not present

## 2019-12-14 DIAGNOSIS — G4733 Obstructive sleep apnea (adult) (pediatric): Secondary | ICD-10-CM | POA: Diagnosis not present

## 2019-12-14 DIAGNOSIS — I5032 Chronic diastolic (congestive) heart failure: Secondary | ICD-10-CM | POA: Diagnosis not present

## 2019-12-14 DIAGNOSIS — M79671 Pain in right foot: Secondary | ICD-10-CM | POA: Diagnosis not present

## 2019-12-14 DIAGNOSIS — E43 Unspecified severe protein-calorie malnutrition: Secondary | ICD-10-CM | POA: Diagnosis not present

## 2019-12-14 DIAGNOSIS — M79604 Pain in right leg: Secondary | ICD-10-CM | POA: Diagnosis not present

## 2019-12-14 DIAGNOSIS — G8929 Other chronic pain: Secondary | ICD-10-CM | POA: Diagnosis not present

## 2019-12-14 DIAGNOSIS — M79672 Pain in left foot: Secondary | ICD-10-CM | POA: Diagnosis not present

## 2019-12-14 DIAGNOSIS — Z466 Encounter for fitting and adjustment of urinary device: Secondary | ICD-10-CM | POA: Diagnosis not present

## 2019-12-14 DIAGNOSIS — Z87440 Personal history of urinary (tract) infections: Secondary | ICD-10-CM | POA: Diagnosis not present

## 2019-12-14 DIAGNOSIS — Z993 Dependence on wheelchair: Secondary | ICD-10-CM | POA: Diagnosis not present

## 2019-12-18 DIAGNOSIS — I5032 Chronic diastolic (congestive) heart failure: Secondary | ICD-10-CM | POA: Diagnosis not present

## 2019-12-18 DIAGNOSIS — L89159 Pressure ulcer of sacral region, unspecified stage: Secondary | ICD-10-CM | POA: Diagnosis not present

## 2019-12-18 DIAGNOSIS — I69351 Hemiplegia and hemiparesis following cerebral infarction affecting right dominant side: Secondary | ICD-10-CM | POA: Diagnosis not present

## 2019-12-18 DIAGNOSIS — E118 Type 2 diabetes mellitus with unspecified complications: Secondary | ICD-10-CM | POA: Diagnosis not present

## 2019-12-18 DIAGNOSIS — I252 Old myocardial infarction: Secondary | ICD-10-CM | POA: Diagnosis not present

## 2019-12-18 DIAGNOSIS — I10 Essential (primary) hypertension: Secondary | ICD-10-CM | POA: Diagnosis not present

## 2019-12-18 DIAGNOSIS — L89313 Pressure ulcer of right buttock, stage 3: Secondary | ICD-10-CM | POA: Diagnosis not present

## 2019-12-18 DIAGNOSIS — J439 Emphysema, unspecified: Secondary | ICD-10-CM | POA: Diagnosis not present

## 2019-12-18 DIAGNOSIS — G894 Chronic pain syndrome: Secondary | ICD-10-CM | POA: Diagnosis not present

## 2019-12-18 DIAGNOSIS — R1310 Dysphagia, unspecified: Secondary | ICD-10-CM | POA: Diagnosis not present

## 2019-12-18 DIAGNOSIS — I130 Hypertensive heart and chronic kidney disease with heart failure and stage 1 through stage 4 chronic kidney disease, or unspecified chronic kidney disease: Secondary | ICD-10-CM | POA: Diagnosis not present

## 2019-12-18 DIAGNOSIS — E43 Unspecified severe protein-calorie malnutrition: Secondary | ICD-10-CM | POA: Diagnosis not present

## 2019-12-18 DIAGNOSIS — D631 Anemia in chronic kidney disease: Secondary | ICD-10-CM | POA: Diagnosis not present

## 2019-12-18 DIAGNOSIS — Z87440 Personal history of urinary (tract) infections: Secondary | ICD-10-CM | POA: Diagnosis not present

## 2019-12-18 DIAGNOSIS — N139 Obstructive and reflux uropathy, unspecified: Secondary | ICD-10-CM | POA: Diagnosis not present

## 2019-12-18 DIAGNOSIS — M1990 Unspecified osteoarthritis, unspecified site: Secondary | ICD-10-CM | POA: Diagnosis not present

## 2019-12-18 DIAGNOSIS — G8929 Other chronic pain: Secondary | ICD-10-CM | POA: Diagnosis not present

## 2019-12-18 DIAGNOSIS — I69391 Dysphagia following cerebral infarction: Secondary | ICD-10-CM | POA: Diagnosis not present

## 2019-12-18 DIAGNOSIS — Z466 Encounter for fitting and adjustment of urinary device: Secondary | ICD-10-CM | POA: Diagnosis not present

## 2019-12-18 DIAGNOSIS — E7800 Pure hypercholesterolemia, unspecified: Secondary | ICD-10-CM | POA: Diagnosis not present

## 2019-12-18 DIAGNOSIS — E1122 Type 2 diabetes mellitus with diabetic chronic kidney disease: Secondary | ICD-10-CM | POA: Diagnosis not present

## 2019-12-18 DIAGNOSIS — N184 Chronic kidney disease, stage 4 (severe): Secondary | ICD-10-CM | POA: Diagnosis not present

## 2019-12-18 DIAGNOSIS — Z9181 History of falling: Secondary | ICD-10-CM | POA: Diagnosis not present

## 2019-12-18 DIAGNOSIS — I69320 Aphasia following cerebral infarction: Secondary | ICD-10-CM | POA: Diagnosis not present

## 2019-12-18 DIAGNOSIS — M419 Scoliosis, unspecified: Secondary | ICD-10-CM | POA: Diagnosis not present

## 2019-12-18 DIAGNOSIS — G4733 Obstructive sleep apnea (adult) (pediatric): Secondary | ICD-10-CM | POA: Diagnosis not present

## 2019-12-18 DIAGNOSIS — Z960 Presence of urogenital implants: Secondary | ICD-10-CM | POA: Diagnosis not present

## 2019-12-18 DIAGNOSIS — I2510 Atherosclerotic heart disease of native coronary artery without angina pectoris: Secondary | ICD-10-CM | POA: Diagnosis not present

## 2019-12-18 DIAGNOSIS — Z7401 Bed confinement status: Secondary | ICD-10-CM | POA: Diagnosis not present

## 2019-12-23 DIAGNOSIS — J449 Chronic obstructive pulmonary disease, unspecified: Secondary | ICD-10-CM | POA: Diagnosis not present

## 2019-12-24 DIAGNOSIS — L89313 Pressure ulcer of right buttock, stage 3: Secondary | ICD-10-CM | POA: Diagnosis not present

## 2019-12-26 DIAGNOSIS — I69391 Dysphagia following cerebral infarction: Secondary | ICD-10-CM | POA: Diagnosis not present

## 2019-12-26 DIAGNOSIS — G8929 Other chronic pain: Secondary | ICD-10-CM | POA: Diagnosis not present

## 2019-12-26 DIAGNOSIS — I5032 Chronic diastolic (congestive) heart failure: Secondary | ICD-10-CM | POA: Diagnosis not present

## 2019-12-26 DIAGNOSIS — N184 Chronic kidney disease, stage 4 (severe): Secondary | ICD-10-CM | POA: Diagnosis not present

## 2019-12-26 DIAGNOSIS — D631 Anemia in chronic kidney disease: Secondary | ICD-10-CM | POA: Diagnosis not present

## 2019-12-26 DIAGNOSIS — I130 Hypertensive heart and chronic kidney disease with heart failure and stage 1 through stage 4 chronic kidney disease, or unspecified chronic kidney disease: Secondary | ICD-10-CM | POA: Diagnosis not present

## 2019-12-26 DIAGNOSIS — G4733 Obstructive sleep apnea (adult) (pediatric): Secondary | ICD-10-CM | POA: Diagnosis not present

## 2019-12-26 DIAGNOSIS — E1122 Type 2 diabetes mellitus with diabetic chronic kidney disease: Secondary | ICD-10-CM | POA: Diagnosis not present

## 2019-12-26 DIAGNOSIS — M1990 Unspecified osteoarthritis, unspecified site: Secondary | ICD-10-CM | POA: Diagnosis not present

## 2019-12-26 DIAGNOSIS — I69351 Hemiplegia and hemiparesis following cerebral infarction affecting right dominant side: Secondary | ICD-10-CM | POA: Diagnosis not present

## 2019-12-26 DIAGNOSIS — I252 Old myocardial infarction: Secondary | ICD-10-CM | POA: Diagnosis not present

## 2019-12-26 DIAGNOSIS — M419 Scoliosis, unspecified: Secondary | ICD-10-CM | POA: Diagnosis not present

## 2019-12-26 DIAGNOSIS — I2510 Atherosclerotic heart disease of native coronary artery without angina pectoris: Secondary | ICD-10-CM | POA: Diagnosis not present

## 2019-12-26 DIAGNOSIS — E7800 Pure hypercholesterolemia, unspecified: Secondary | ICD-10-CM | POA: Diagnosis not present

## 2019-12-26 DIAGNOSIS — E43 Unspecified severe protein-calorie malnutrition: Secondary | ICD-10-CM | POA: Diagnosis not present

## 2019-12-26 DIAGNOSIS — Z466 Encounter for fitting and adjustment of urinary device: Secondary | ICD-10-CM | POA: Diagnosis not present

## 2019-12-26 DIAGNOSIS — I69320 Aphasia following cerebral infarction: Secondary | ICD-10-CM | POA: Diagnosis not present

## 2019-12-26 DIAGNOSIS — L89313 Pressure ulcer of right buttock, stage 3: Secondary | ICD-10-CM | POA: Diagnosis not present

## 2019-12-26 DIAGNOSIS — J439 Emphysema, unspecified: Secondary | ICD-10-CM | POA: Diagnosis not present

## 2019-12-26 DIAGNOSIS — N139 Obstructive and reflux uropathy, unspecified: Secondary | ICD-10-CM | POA: Diagnosis not present

## 2019-12-26 DIAGNOSIS — R1310 Dysphagia, unspecified: Secondary | ICD-10-CM | POA: Diagnosis not present

## 2019-12-26 DIAGNOSIS — Z87440 Personal history of urinary (tract) infections: Secondary | ICD-10-CM | POA: Diagnosis not present

## 2019-12-26 DIAGNOSIS — Z7401 Bed confinement status: Secondary | ICD-10-CM | POA: Diagnosis not present

## 2019-12-26 DIAGNOSIS — Z9181 History of falling: Secondary | ICD-10-CM | POA: Diagnosis not present

## 2019-12-27 DIAGNOSIS — I639 Cerebral infarction, unspecified: Secondary | ICD-10-CM | POA: Diagnosis not present

## 2019-12-27 DIAGNOSIS — L89313 Pressure ulcer of right buttock, stage 3: Secondary | ICD-10-CM | POA: Diagnosis not present

## 2019-12-27 DIAGNOSIS — G8111 Spastic hemiplegia affecting right dominant side: Secondary | ICD-10-CM | POA: Diagnosis not present

## 2019-12-27 DIAGNOSIS — N319 Neuromuscular dysfunction of bladder, unspecified: Secondary | ICD-10-CM | POA: Diagnosis not present

## 2019-12-27 DIAGNOSIS — R339 Retention of urine, unspecified: Secondary | ICD-10-CM | POA: Diagnosis not present

## 2019-12-27 DIAGNOSIS — E119 Type 2 diabetes mellitus without complications: Secondary | ICD-10-CM | POA: Diagnosis not present

## 2019-12-27 DIAGNOSIS — Z466 Encounter for fitting and adjustment of urinary device: Secondary | ICD-10-CM | POA: Diagnosis not present

## 2019-12-28 DIAGNOSIS — I69351 Hemiplegia and hemiparesis following cerebral infarction affecting right dominant side: Secondary | ICD-10-CM | POA: Diagnosis not present

## 2019-12-28 DIAGNOSIS — N139 Obstructive and reflux uropathy, unspecified: Secondary | ICD-10-CM | POA: Diagnosis not present

## 2019-12-28 DIAGNOSIS — J439 Emphysema, unspecified: Secondary | ICD-10-CM | POA: Diagnosis not present

## 2019-12-28 DIAGNOSIS — E1122 Type 2 diabetes mellitus with diabetic chronic kidney disease: Secondary | ICD-10-CM | POA: Diagnosis not present

## 2019-12-28 DIAGNOSIS — D631 Anemia in chronic kidney disease: Secondary | ICD-10-CM | POA: Diagnosis not present

## 2019-12-28 DIAGNOSIS — N184 Chronic kidney disease, stage 4 (severe): Secondary | ICD-10-CM | POA: Diagnosis not present

## 2019-12-28 DIAGNOSIS — I252 Old myocardial infarction: Secondary | ICD-10-CM | POA: Diagnosis not present

## 2019-12-28 DIAGNOSIS — L89313 Pressure ulcer of right buttock, stage 3: Secondary | ICD-10-CM | POA: Diagnosis not present

## 2019-12-28 DIAGNOSIS — R1310 Dysphagia, unspecified: Secondary | ICD-10-CM | POA: Diagnosis not present

## 2019-12-28 DIAGNOSIS — G8929 Other chronic pain: Secondary | ICD-10-CM | POA: Diagnosis not present

## 2019-12-28 DIAGNOSIS — Z87440 Personal history of urinary (tract) infections: Secondary | ICD-10-CM | POA: Diagnosis not present

## 2019-12-28 DIAGNOSIS — E7800 Pure hypercholesterolemia, unspecified: Secondary | ICD-10-CM | POA: Diagnosis not present

## 2019-12-28 DIAGNOSIS — M419 Scoliosis, unspecified: Secondary | ICD-10-CM | POA: Diagnosis not present

## 2019-12-28 DIAGNOSIS — M1990 Unspecified osteoarthritis, unspecified site: Secondary | ICD-10-CM | POA: Diagnosis not present

## 2019-12-28 DIAGNOSIS — I69391 Dysphagia following cerebral infarction: Secondary | ICD-10-CM | POA: Diagnosis not present

## 2019-12-28 DIAGNOSIS — I2510 Atherosclerotic heart disease of native coronary artery without angina pectoris: Secondary | ICD-10-CM | POA: Diagnosis not present

## 2019-12-28 DIAGNOSIS — Z7401 Bed confinement status: Secondary | ICD-10-CM | POA: Diagnosis not present

## 2019-12-28 DIAGNOSIS — Z9181 History of falling: Secondary | ICD-10-CM | POA: Diagnosis not present

## 2019-12-28 DIAGNOSIS — G4733 Obstructive sleep apnea (adult) (pediatric): Secondary | ICD-10-CM | POA: Diagnosis not present

## 2019-12-28 DIAGNOSIS — I69320 Aphasia following cerebral infarction: Secondary | ICD-10-CM | POA: Diagnosis not present

## 2019-12-28 DIAGNOSIS — Z466 Encounter for fitting and adjustment of urinary device: Secondary | ICD-10-CM | POA: Diagnosis not present

## 2019-12-28 DIAGNOSIS — I130 Hypertensive heart and chronic kidney disease with heart failure and stage 1 through stage 4 chronic kidney disease, or unspecified chronic kidney disease: Secondary | ICD-10-CM | POA: Diagnosis not present

## 2019-12-28 DIAGNOSIS — I5032 Chronic diastolic (congestive) heart failure: Secondary | ICD-10-CM | POA: Diagnosis not present

## 2019-12-28 DIAGNOSIS — E43 Unspecified severe protein-calorie malnutrition: Secondary | ICD-10-CM | POA: Diagnosis not present

## 2020-01-01 DIAGNOSIS — N184 Chronic kidney disease, stage 4 (severe): Secondary | ICD-10-CM | POA: Diagnosis not present

## 2020-01-01 DIAGNOSIS — E7800 Pure hypercholesterolemia, unspecified: Secondary | ICD-10-CM | POA: Diagnosis not present

## 2020-01-01 DIAGNOSIS — Z87440 Personal history of urinary (tract) infections: Secondary | ICD-10-CM | POA: Diagnosis not present

## 2020-01-01 DIAGNOSIS — E1122 Type 2 diabetes mellitus with diabetic chronic kidney disease: Secondary | ICD-10-CM | POA: Diagnosis not present

## 2020-01-01 DIAGNOSIS — D631 Anemia in chronic kidney disease: Secondary | ICD-10-CM | POA: Diagnosis not present

## 2020-01-01 DIAGNOSIS — N139 Obstructive and reflux uropathy, unspecified: Secondary | ICD-10-CM | POA: Diagnosis not present

## 2020-01-01 DIAGNOSIS — I2510 Atherosclerotic heart disease of native coronary artery without angina pectoris: Secondary | ICD-10-CM | POA: Diagnosis not present

## 2020-01-01 DIAGNOSIS — Z7401 Bed confinement status: Secondary | ICD-10-CM | POA: Diagnosis not present

## 2020-01-01 DIAGNOSIS — J439 Emphysema, unspecified: Secondary | ICD-10-CM | POA: Diagnosis not present

## 2020-01-01 DIAGNOSIS — I130 Hypertensive heart and chronic kidney disease with heart failure and stage 1 through stage 4 chronic kidney disease, or unspecified chronic kidney disease: Secondary | ICD-10-CM | POA: Diagnosis not present

## 2020-01-01 DIAGNOSIS — M1990 Unspecified osteoarthritis, unspecified site: Secondary | ICD-10-CM | POA: Diagnosis not present

## 2020-01-01 DIAGNOSIS — Z9181 History of falling: Secondary | ICD-10-CM | POA: Diagnosis not present

## 2020-01-01 DIAGNOSIS — G8929 Other chronic pain: Secondary | ICD-10-CM | POA: Diagnosis not present

## 2020-01-01 DIAGNOSIS — M419 Scoliosis, unspecified: Secondary | ICD-10-CM | POA: Diagnosis not present

## 2020-01-01 DIAGNOSIS — I69320 Aphasia following cerebral infarction: Secondary | ICD-10-CM | POA: Diagnosis not present

## 2020-01-01 DIAGNOSIS — G4733 Obstructive sleep apnea (adult) (pediatric): Secondary | ICD-10-CM | POA: Diagnosis not present

## 2020-01-01 DIAGNOSIS — Z466 Encounter for fitting and adjustment of urinary device: Secondary | ICD-10-CM | POA: Diagnosis not present

## 2020-01-01 DIAGNOSIS — R1310 Dysphagia, unspecified: Secondary | ICD-10-CM | POA: Diagnosis not present

## 2020-01-01 DIAGNOSIS — I5032 Chronic diastolic (congestive) heart failure: Secondary | ICD-10-CM | POA: Diagnosis not present

## 2020-01-01 DIAGNOSIS — I69391 Dysphagia following cerebral infarction: Secondary | ICD-10-CM | POA: Diagnosis not present

## 2020-01-01 DIAGNOSIS — I69351 Hemiplegia and hemiparesis following cerebral infarction affecting right dominant side: Secondary | ICD-10-CM | POA: Diagnosis not present

## 2020-01-01 DIAGNOSIS — E43 Unspecified severe protein-calorie malnutrition: Secondary | ICD-10-CM | POA: Diagnosis not present

## 2020-01-01 DIAGNOSIS — I252 Old myocardial infarction: Secondary | ICD-10-CM | POA: Diagnosis not present

## 2020-01-01 DIAGNOSIS — L89313 Pressure ulcer of right buttock, stage 3: Secondary | ICD-10-CM | POA: Diagnosis not present

## 2020-01-04 DIAGNOSIS — J439 Emphysema, unspecified: Secondary | ICD-10-CM | POA: Diagnosis not present

## 2020-01-04 DIAGNOSIS — L89313 Pressure ulcer of right buttock, stage 3: Secondary | ICD-10-CM | POA: Diagnosis not present

## 2020-01-04 DIAGNOSIS — M419 Scoliosis, unspecified: Secondary | ICD-10-CM | POA: Diagnosis not present

## 2020-01-04 DIAGNOSIS — E1122 Type 2 diabetes mellitus with diabetic chronic kidney disease: Secondary | ICD-10-CM | POA: Diagnosis not present

## 2020-01-04 DIAGNOSIS — Z9181 History of falling: Secondary | ICD-10-CM | POA: Diagnosis not present

## 2020-01-04 DIAGNOSIS — N139 Obstructive and reflux uropathy, unspecified: Secondary | ICD-10-CM | POA: Diagnosis not present

## 2020-01-04 DIAGNOSIS — Z7401 Bed confinement status: Secondary | ICD-10-CM | POA: Diagnosis not present

## 2020-01-04 DIAGNOSIS — I2510 Atherosclerotic heart disease of native coronary artery without angina pectoris: Secondary | ICD-10-CM | POA: Diagnosis not present

## 2020-01-04 DIAGNOSIS — I69320 Aphasia following cerebral infarction: Secondary | ICD-10-CM | POA: Diagnosis not present

## 2020-01-04 DIAGNOSIS — Z466 Encounter for fitting and adjustment of urinary device: Secondary | ICD-10-CM | POA: Diagnosis not present

## 2020-01-04 DIAGNOSIS — N184 Chronic kidney disease, stage 4 (severe): Secondary | ICD-10-CM | POA: Diagnosis not present

## 2020-01-04 DIAGNOSIS — I69351 Hemiplegia and hemiparesis following cerebral infarction affecting right dominant side: Secondary | ICD-10-CM | POA: Diagnosis not present

## 2020-01-04 DIAGNOSIS — Z87440 Personal history of urinary (tract) infections: Secondary | ICD-10-CM | POA: Diagnosis not present

## 2020-01-04 DIAGNOSIS — R1310 Dysphagia, unspecified: Secondary | ICD-10-CM | POA: Diagnosis not present

## 2020-01-04 DIAGNOSIS — I130 Hypertensive heart and chronic kidney disease with heart failure and stage 1 through stage 4 chronic kidney disease, or unspecified chronic kidney disease: Secondary | ICD-10-CM | POA: Diagnosis not present

## 2020-01-04 DIAGNOSIS — D631 Anemia in chronic kidney disease: Secondary | ICD-10-CM | POA: Diagnosis not present

## 2020-01-04 DIAGNOSIS — I5032 Chronic diastolic (congestive) heart failure: Secondary | ICD-10-CM | POA: Diagnosis not present

## 2020-01-04 DIAGNOSIS — I252 Old myocardial infarction: Secondary | ICD-10-CM | POA: Diagnosis not present

## 2020-01-04 DIAGNOSIS — E7800 Pure hypercholesterolemia, unspecified: Secondary | ICD-10-CM | POA: Diagnosis not present

## 2020-01-04 DIAGNOSIS — E43 Unspecified severe protein-calorie malnutrition: Secondary | ICD-10-CM | POA: Diagnosis not present

## 2020-01-04 DIAGNOSIS — I69391 Dysphagia following cerebral infarction: Secondary | ICD-10-CM | POA: Diagnosis not present

## 2020-01-04 DIAGNOSIS — G4733 Obstructive sleep apnea (adult) (pediatric): Secondary | ICD-10-CM | POA: Diagnosis not present

## 2020-01-04 DIAGNOSIS — G8929 Other chronic pain: Secondary | ICD-10-CM | POA: Diagnosis not present

## 2020-01-04 DIAGNOSIS — M1990 Unspecified osteoarthritis, unspecified site: Secondary | ICD-10-CM | POA: Diagnosis not present

## 2020-01-08 DIAGNOSIS — M1990 Unspecified osteoarthritis, unspecified site: Secondary | ICD-10-CM | POA: Diagnosis not present

## 2020-01-08 DIAGNOSIS — G8929 Other chronic pain: Secondary | ICD-10-CM | POA: Diagnosis not present

## 2020-01-08 DIAGNOSIS — N184 Chronic kidney disease, stage 4 (severe): Secondary | ICD-10-CM | POA: Diagnosis not present

## 2020-01-08 DIAGNOSIS — I69320 Aphasia following cerebral infarction: Secondary | ICD-10-CM | POA: Diagnosis not present

## 2020-01-08 DIAGNOSIS — Z7401 Bed confinement status: Secondary | ICD-10-CM | POA: Diagnosis not present

## 2020-01-08 DIAGNOSIS — M419 Scoliosis, unspecified: Secondary | ICD-10-CM | POA: Diagnosis not present

## 2020-01-08 DIAGNOSIS — G4733 Obstructive sleep apnea (adult) (pediatric): Secondary | ICD-10-CM | POA: Diagnosis not present

## 2020-01-08 DIAGNOSIS — J439 Emphysema, unspecified: Secondary | ICD-10-CM | POA: Diagnosis not present

## 2020-01-08 DIAGNOSIS — E7800 Pure hypercholesterolemia, unspecified: Secondary | ICD-10-CM | POA: Diagnosis not present

## 2020-01-08 DIAGNOSIS — N139 Obstructive and reflux uropathy, unspecified: Secondary | ICD-10-CM | POA: Diagnosis not present

## 2020-01-08 DIAGNOSIS — Z466 Encounter for fitting and adjustment of urinary device: Secondary | ICD-10-CM | POA: Diagnosis not present

## 2020-01-08 DIAGNOSIS — I2510 Atherosclerotic heart disease of native coronary artery without angina pectoris: Secondary | ICD-10-CM | POA: Diagnosis not present

## 2020-01-08 DIAGNOSIS — I252 Old myocardial infarction: Secondary | ICD-10-CM | POA: Diagnosis not present

## 2020-01-08 DIAGNOSIS — Z9181 History of falling: Secondary | ICD-10-CM | POA: Diagnosis not present

## 2020-01-08 DIAGNOSIS — I69391 Dysphagia following cerebral infarction: Secondary | ICD-10-CM | POA: Diagnosis not present

## 2020-01-08 DIAGNOSIS — E1122 Type 2 diabetes mellitus with diabetic chronic kidney disease: Secondary | ICD-10-CM | POA: Diagnosis not present

## 2020-01-08 DIAGNOSIS — Z87440 Personal history of urinary (tract) infections: Secondary | ICD-10-CM | POA: Diagnosis not present

## 2020-01-08 DIAGNOSIS — D631 Anemia in chronic kidney disease: Secondary | ICD-10-CM | POA: Diagnosis not present

## 2020-01-08 DIAGNOSIS — I130 Hypertensive heart and chronic kidney disease with heart failure and stage 1 through stage 4 chronic kidney disease, or unspecified chronic kidney disease: Secondary | ICD-10-CM | POA: Diagnosis not present

## 2020-01-08 DIAGNOSIS — E43 Unspecified severe protein-calorie malnutrition: Secondary | ICD-10-CM | POA: Diagnosis not present

## 2020-01-08 DIAGNOSIS — I5032 Chronic diastolic (congestive) heart failure: Secondary | ICD-10-CM | POA: Diagnosis not present

## 2020-01-08 DIAGNOSIS — R1310 Dysphagia, unspecified: Secondary | ICD-10-CM | POA: Diagnosis not present

## 2020-01-08 DIAGNOSIS — L89313 Pressure ulcer of right buttock, stage 3: Secondary | ICD-10-CM | POA: Diagnosis not present

## 2020-01-08 DIAGNOSIS — I69351 Hemiplegia and hemiparesis following cerebral infarction affecting right dominant side: Secondary | ICD-10-CM | POA: Diagnosis not present

## 2020-01-09 DIAGNOSIS — L89313 Pressure ulcer of right buttock, stage 3: Secondary | ICD-10-CM | POA: Diagnosis not present

## 2020-01-11 DIAGNOSIS — I252 Old myocardial infarction: Secondary | ICD-10-CM | POA: Diagnosis not present

## 2020-01-11 DIAGNOSIS — L89313 Pressure ulcer of right buttock, stage 3: Secondary | ICD-10-CM | POA: Diagnosis not present

## 2020-01-11 DIAGNOSIS — I2510 Atherosclerotic heart disease of native coronary artery without angina pectoris: Secondary | ICD-10-CM | POA: Diagnosis not present

## 2020-01-11 DIAGNOSIS — G8929 Other chronic pain: Secondary | ICD-10-CM | POA: Diagnosis not present

## 2020-01-11 DIAGNOSIS — N139 Obstructive and reflux uropathy, unspecified: Secondary | ICD-10-CM | POA: Diagnosis not present

## 2020-01-11 DIAGNOSIS — E7800 Pure hypercholesterolemia, unspecified: Secondary | ICD-10-CM | POA: Diagnosis not present

## 2020-01-11 DIAGNOSIS — Z87440 Personal history of urinary (tract) infections: Secondary | ICD-10-CM | POA: Diagnosis not present

## 2020-01-11 DIAGNOSIS — E1122 Type 2 diabetes mellitus with diabetic chronic kidney disease: Secondary | ICD-10-CM | POA: Diagnosis not present

## 2020-01-11 DIAGNOSIS — I69351 Hemiplegia and hemiparesis following cerebral infarction affecting right dominant side: Secondary | ICD-10-CM | POA: Diagnosis not present

## 2020-01-11 DIAGNOSIS — Z9181 History of falling: Secondary | ICD-10-CM | POA: Diagnosis not present

## 2020-01-11 DIAGNOSIS — M419 Scoliosis, unspecified: Secondary | ICD-10-CM | POA: Diagnosis not present

## 2020-01-11 DIAGNOSIS — N184 Chronic kidney disease, stage 4 (severe): Secondary | ICD-10-CM | POA: Diagnosis not present

## 2020-01-11 DIAGNOSIS — I69391 Dysphagia following cerebral infarction: Secondary | ICD-10-CM | POA: Diagnosis not present

## 2020-01-11 DIAGNOSIS — M1990 Unspecified osteoarthritis, unspecified site: Secondary | ICD-10-CM | POA: Diagnosis not present

## 2020-01-11 DIAGNOSIS — E43 Unspecified severe protein-calorie malnutrition: Secondary | ICD-10-CM | POA: Diagnosis not present

## 2020-01-11 DIAGNOSIS — J439 Emphysema, unspecified: Secondary | ICD-10-CM | POA: Diagnosis not present

## 2020-01-11 DIAGNOSIS — G4733 Obstructive sleep apnea (adult) (pediatric): Secondary | ICD-10-CM | POA: Diagnosis not present

## 2020-01-11 DIAGNOSIS — Z466 Encounter for fitting and adjustment of urinary device: Secondary | ICD-10-CM | POA: Diagnosis not present

## 2020-01-11 DIAGNOSIS — R1310 Dysphagia, unspecified: Secondary | ICD-10-CM | POA: Diagnosis not present

## 2020-01-11 DIAGNOSIS — I5032 Chronic diastolic (congestive) heart failure: Secondary | ICD-10-CM | POA: Diagnosis not present

## 2020-01-11 DIAGNOSIS — Z7401 Bed confinement status: Secondary | ICD-10-CM | POA: Diagnosis not present

## 2020-01-11 DIAGNOSIS — I69320 Aphasia following cerebral infarction: Secondary | ICD-10-CM | POA: Diagnosis not present

## 2020-01-11 DIAGNOSIS — I130 Hypertensive heart and chronic kidney disease with heart failure and stage 1 through stage 4 chronic kidney disease, or unspecified chronic kidney disease: Secondary | ICD-10-CM | POA: Diagnosis not present

## 2020-01-11 DIAGNOSIS — D631 Anemia in chronic kidney disease: Secondary | ICD-10-CM | POA: Diagnosis not present

## 2020-01-15 DIAGNOSIS — L89159 Pressure ulcer of sacral region, unspecified stage: Secondary | ICD-10-CM | POA: Diagnosis not present

## 2020-01-15 DIAGNOSIS — R82998 Other abnormal findings in urine: Secondary | ICD-10-CM | POA: Diagnosis not present

## 2020-01-15 DIAGNOSIS — I10 Essential (primary) hypertension: Secondary | ICD-10-CM | POA: Diagnosis not present

## 2020-01-15 DIAGNOSIS — E118 Type 2 diabetes mellitus with unspecified complications: Secondary | ICD-10-CM | POA: Diagnosis not present

## 2020-01-15 DIAGNOSIS — Z466 Encounter for fitting and adjustment of urinary device: Secondary | ICD-10-CM | POA: Diagnosis not present

## 2020-01-18 DIAGNOSIS — I252 Old myocardial infarction: Secondary | ICD-10-CM | POA: Diagnosis not present

## 2020-01-18 DIAGNOSIS — I130 Hypertensive heart and chronic kidney disease with heart failure and stage 1 through stage 4 chronic kidney disease, or unspecified chronic kidney disease: Secondary | ICD-10-CM | POA: Diagnosis not present

## 2020-01-18 DIAGNOSIS — N184 Chronic kidney disease, stage 4 (severe): Secondary | ICD-10-CM | POA: Diagnosis not present

## 2020-01-18 DIAGNOSIS — Z7401 Bed confinement status: Secondary | ICD-10-CM | POA: Diagnosis not present

## 2020-01-18 DIAGNOSIS — D631 Anemia in chronic kidney disease: Secondary | ICD-10-CM | POA: Diagnosis not present

## 2020-01-18 DIAGNOSIS — G8929 Other chronic pain: Secondary | ICD-10-CM | POA: Diagnosis not present

## 2020-01-18 DIAGNOSIS — Z9181 History of falling: Secondary | ICD-10-CM | POA: Diagnosis not present

## 2020-01-18 DIAGNOSIS — E43 Unspecified severe protein-calorie malnutrition: Secondary | ICD-10-CM | POA: Diagnosis not present

## 2020-01-18 DIAGNOSIS — N139 Obstructive and reflux uropathy, unspecified: Secondary | ICD-10-CM | POA: Diagnosis not present

## 2020-01-18 DIAGNOSIS — E1122 Type 2 diabetes mellitus with diabetic chronic kidney disease: Secondary | ICD-10-CM | POA: Diagnosis not present

## 2020-01-18 DIAGNOSIS — M1990 Unspecified osteoarthritis, unspecified site: Secondary | ICD-10-CM | POA: Diagnosis not present

## 2020-01-18 DIAGNOSIS — I5032 Chronic diastolic (congestive) heart failure: Secondary | ICD-10-CM | POA: Diagnosis not present

## 2020-01-18 DIAGNOSIS — G4733 Obstructive sleep apnea (adult) (pediatric): Secondary | ICD-10-CM | POA: Diagnosis not present

## 2020-01-18 DIAGNOSIS — I69351 Hemiplegia and hemiparesis following cerebral infarction affecting right dominant side: Secondary | ICD-10-CM | POA: Diagnosis not present

## 2020-01-18 DIAGNOSIS — Z466 Encounter for fitting and adjustment of urinary device: Secondary | ICD-10-CM | POA: Diagnosis not present

## 2020-01-18 DIAGNOSIS — I2510 Atherosclerotic heart disease of native coronary artery without angina pectoris: Secondary | ICD-10-CM | POA: Diagnosis not present

## 2020-01-18 DIAGNOSIS — I69320 Aphasia following cerebral infarction: Secondary | ICD-10-CM | POA: Diagnosis not present

## 2020-01-18 DIAGNOSIS — L89313 Pressure ulcer of right buttock, stage 3: Secondary | ICD-10-CM | POA: Diagnosis not present

## 2020-01-18 DIAGNOSIS — J439 Emphysema, unspecified: Secondary | ICD-10-CM | POA: Diagnosis not present

## 2020-01-18 DIAGNOSIS — E7800 Pure hypercholesterolemia, unspecified: Secondary | ICD-10-CM | POA: Diagnosis not present

## 2020-01-18 DIAGNOSIS — M419 Scoliosis, unspecified: Secondary | ICD-10-CM | POA: Diagnosis not present

## 2020-01-18 DIAGNOSIS — R1310 Dysphagia, unspecified: Secondary | ICD-10-CM | POA: Diagnosis not present

## 2020-01-18 DIAGNOSIS — Z87440 Personal history of urinary (tract) infections: Secondary | ICD-10-CM | POA: Diagnosis not present

## 2020-01-18 DIAGNOSIS — I69391 Dysphagia following cerebral infarction: Secondary | ICD-10-CM | POA: Diagnosis not present

## 2020-01-22 DIAGNOSIS — J449 Chronic obstructive pulmonary disease, unspecified: Secondary | ICD-10-CM | POA: Diagnosis not present

## 2020-01-22 DIAGNOSIS — I639 Cerebral infarction, unspecified: Secondary | ICD-10-CM | POA: Diagnosis not present

## 2020-01-22 DIAGNOSIS — G8111 Spastic hemiplegia affecting right dominant side: Secondary | ICD-10-CM | POA: Diagnosis not present

## 2020-01-22 DIAGNOSIS — R339 Retention of urine, unspecified: Secondary | ICD-10-CM | POA: Diagnosis not present

## 2020-01-22 DIAGNOSIS — Z466 Encounter for fitting and adjustment of urinary device: Secondary | ICD-10-CM | POA: Diagnosis not present

## 2020-01-22 DIAGNOSIS — M150 Primary generalized (osteo)arthritis: Secondary | ICD-10-CM | POA: Diagnosis not present

## 2020-01-22 DIAGNOSIS — N319 Neuromuscular dysfunction of bladder, unspecified: Secondary | ICD-10-CM | POA: Diagnosis not present

## 2020-01-23 DIAGNOSIS — I69391 Dysphagia following cerebral infarction: Secondary | ICD-10-CM | POA: Diagnosis not present

## 2020-01-23 DIAGNOSIS — Z466 Encounter for fitting and adjustment of urinary device: Secondary | ICD-10-CM | POA: Diagnosis not present

## 2020-01-23 DIAGNOSIS — N139 Obstructive and reflux uropathy, unspecified: Secondary | ICD-10-CM | POA: Diagnosis not present

## 2020-01-23 DIAGNOSIS — G8929 Other chronic pain: Secondary | ICD-10-CM | POA: Diagnosis not present

## 2020-01-23 DIAGNOSIS — I2510 Atherosclerotic heart disease of native coronary artery without angina pectoris: Secondary | ICD-10-CM | POA: Diagnosis not present

## 2020-01-23 DIAGNOSIS — I252 Old myocardial infarction: Secondary | ICD-10-CM | POA: Diagnosis not present

## 2020-01-23 DIAGNOSIS — I69320 Aphasia following cerebral infarction: Secondary | ICD-10-CM | POA: Diagnosis not present

## 2020-01-23 DIAGNOSIS — E7800 Pure hypercholesterolemia, unspecified: Secondary | ICD-10-CM | POA: Diagnosis not present

## 2020-01-23 DIAGNOSIS — M1990 Unspecified osteoarthritis, unspecified site: Secondary | ICD-10-CM | POA: Diagnosis not present

## 2020-01-23 DIAGNOSIS — J439 Emphysema, unspecified: Secondary | ICD-10-CM | POA: Diagnosis not present

## 2020-01-23 DIAGNOSIS — I5032 Chronic diastolic (congestive) heart failure: Secondary | ICD-10-CM | POA: Diagnosis not present

## 2020-01-23 DIAGNOSIS — I130 Hypertensive heart and chronic kidney disease with heart failure and stage 1 through stage 4 chronic kidney disease, or unspecified chronic kidney disease: Secondary | ICD-10-CM | POA: Diagnosis not present

## 2020-01-23 DIAGNOSIS — R1310 Dysphagia, unspecified: Secondary | ICD-10-CM | POA: Diagnosis not present

## 2020-01-23 DIAGNOSIS — M419 Scoliosis, unspecified: Secondary | ICD-10-CM | POA: Diagnosis not present

## 2020-01-23 DIAGNOSIS — N184 Chronic kidney disease, stage 4 (severe): Secondary | ICD-10-CM | POA: Diagnosis not present

## 2020-01-23 DIAGNOSIS — D631 Anemia in chronic kidney disease: Secondary | ICD-10-CM | POA: Diagnosis not present

## 2020-01-23 DIAGNOSIS — Z7401 Bed confinement status: Secondary | ICD-10-CM | POA: Diagnosis not present

## 2020-01-23 DIAGNOSIS — E1122 Type 2 diabetes mellitus with diabetic chronic kidney disease: Secondary | ICD-10-CM | POA: Diagnosis not present

## 2020-01-23 DIAGNOSIS — Z9181 History of falling: Secondary | ICD-10-CM | POA: Diagnosis not present

## 2020-01-23 DIAGNOSIS — I69351 Hemiplegia and hemiparesis following cerebral infarction affecting right dominant side: Secondary | ICD-10-CM | POA: Diagnosis not present

## 2020-01-23 DIAGNOSIS — G4733 Obstructive sleep apnea (adult) (pediatric): Secondary | ICD-10-CM | POA: Diagnosis not present

## 2020-01-23 DIAGNOSIS — Z87440 Personal history of urinary (tract) infections: Secondary | ICD-10-CM | POA: Diagnosis not present

## 2020-01-23 DIAGNOSIS — L89313 Pressure ulcer of right buttock, stage 3: Secondary | ICD-10-CM | POA: Diagnosis not present

## 2020-01-23 DIAGNOSIS — E43 Unspecified severe protein-calorie malnutrition: Secondary | ICD-10-CM | POA: Diagnosis not present

## 2020-01-25 DIAGNOSIS — I69351 Hemiplegia and hemiparesis following cerebral infarction affecting right dominant side: Secondary | ICD-10-CM | POA: Diagnosis not present

## 2020-01-25 DIAGNOSIS — E1122 Type 2 diabetes mellitus with diabetic chronic kidney disease: Secondary | ICD-10-CM | POA: Diagnosis not present

## 2020-01-25 DIAGNOSIS — L89313 Pressure ulcer of right buttock, stage 3: Secondary | ICD-10-CM | POA: Diagnosis not present

## 2020-01-25 DIAGNOSIS — Z87440 Personal history of urinary (tract) infections: Secondary | ICD-10-CM | POA: Diagnosis not present

## 2020-01-25 DIAGNOSIS — Z466 Encounter for fitting and adjustment of urinary device: Secondary | ICD-10-CM | POA: Diagnosis not present

## 2020-01-25 DIAGNOSIS — G4733 Obstructive sleep apnea (adult) (pediatric): Secondary | ICD-10-CM | POA: Diagnosis not present

## 2020-01-25 DIAGNOSIS — D631 Anemia in chronic kidney disease: Secondary | ICD-10-CM | POA: Diagnosis not present

## 2020-01-25 DIAGNOSIS — R1310 Dysphagia, unspecified: Secondary | ICD-10-CM | POA: Diagnosis not present

## 2020-01-25 DIAGNOSIS — Z9181 History of falling: Secondary | ICD-10-CM | POA: Diagnosis not present

## 2020-01-25 DIAGNOSIS — Z7401 Bed confinement status: Secondary | ICD-10-CM | POA: Diagnosis not present

## 2020-01-25 DIAGNOSIS — I5032 Chronic diastolic (congestive) heart failure: Secondary | ICD-10-CM | POA: Diagnosis not present

## 2020-01-25 DIAGNOSIS — I2510 Atherosclerotic heart disease of native coronary artery without angina pectoris: Secondary | ICD-10-CM | POA: Diagnosis not present

## 2020-01-25 DIAGNOSIS — J439 Emphysema, unspecified: Secondary | ICD-10-CM | POA: Diagnosis not present

## 2020-01-25 DIAGNOSIS — N139 Obstructive and reflux uropathy, unspecified: Secondary | ICD-10-CM | POA: Diagnosis not present

## 2020-01-25 DIAGNOSIS — G8929 Other chronic pain: Secondary | ICD-10-CM | POA: Diagnosis not present

## 2020-01-25 DIAGNOSIS — E7800 Pure hypercholesterolemia, unspecified: Secondary | ICD-10-CM | POA: Diagnosis not present

## 2020-01-25 DIAGNOSIS — M419 Scoliosis, unspecified: Secondary | ICD-10-CM | POA: Diagnosis not present

## 2020-01-25 DIAGNOSIS — I69391 Dysphagia following cerebral infarction: Secondary | ICD-10-CM | POA: Diagnosis not present

## 2020-01-25 DIAGNOSIS — I252 Old myocardial infarction: Secondary | ICD-10-CM | POA: Diagnosis not present

## 2020-01-25 DIAGNOSIS — I69320 Aphasia following cerebral infarction: Secondary | ICD-10-CM | POA: Diagnosis not present

## 2020-01-25 DIAGNOSIS — I130 Hypertensive heart and chronic kidney disease with heart failure and stage 1 through stage 4 chronic kidney disease, or unspecified chronic kidney disease: Secondary | ICD-10-CM | POA: Diagnosis not present

## 2020-01-25 DIAGNOSIS — N184 Chronic kidney disease, stage 4 (severe): Secondary | ICD-10-CM | POA: Diagnosis not present

## 2020-01-25 DIAGNOSIS — E43 Unspecified severe protein-calorie malnutrition: Secondary | ICD-10-CM | POA: Diagnosis not present

## 2020-01-25 DIAGNOSIS — M1990 Unspecified osteoarthritis, unspecified site: Secondary | ICD-10-CM | POA: Diagnosis not present

## 2020-01-29 DIAGNOSIS — G8111 Spastic hemiplegia affecting right dominant side: Secondary | ICD-10-CM | POA: Diagnosis not present

## 2020-01-29 DIAGNOSIS — E119 Type 2 diabetes mellitus without complications: Secondary | ICD-10-CM | POA: Diagnosis not present

## 2020-01-29 DIAGNOSIS — I639 Cerebral infarction, unspecified: Secondary | ICD-10-CM | POA: Diagnosis not present

## 2020-02-13 DIAGNOSIS — I69351 Hemiplegia and hemiparesis following cerebral infarction affecting right dominant side: Secondary | ICD-10-CM | POA: Diagnosis not present

## 2020-02-13 DIAGNOSIS — G8929 Other chronic pain: Secondary | ICD-10-CM | POA: Diagnosis not present

## 2020-02-13 DIAGNOSIS — E1122 Type 2 diabetes mellitus with diabetic chronic kidney disease: Secondary | ICD-10-CM | POA: Diagnosis not present

## 2020-02-13 DIAGNOSIS — J439 Emphysema, unspecified: Secondary | ICD-10-CM | POA: Diagnosis not present

## 2020-02-13 DIAGNOSIS — Z7401 Bed confinement status: Secondary | ICD-10-CM | POA: Diagnosis not present

## 2020-02-13 DIAGNOSIS — I69391 Dysphagia following cerebral infarction: Secondary | ICD-10-CM | POA: Diagnosis not present

## 2020-02-13 DIAGNOSIS — L89313 Pressure ulcer of right buttock, stage 3: Secondary | ICD-10-CM | POA: Diagnosis not present

## 2020-02-13 DIAGNOSIS — I252 Old myocardial infarction: Secondary | ICD-10-CM | POA: Diagnosis not present

## 2020-02-13 DIAGNOSIS — E7800 Pure hypercholesterolemia, unspecified: Secondary | ICD-10-CM | POA: Diagnosis not present

## 2020-02-13 DIAGNOSIS — Z9181 History of falling: Secondary | ICD-10-CM | POA: Diagnosis not present

## 2020-02-13 DIAGNOSIS — I130 Hypertensive heart and chronic kidney disease with heart failure and stage 1 through stage 4 chronic kidney disease, or unspecified chronic kidney disease: Secondary | ICD-10-CM | POA: Diagnosis not present

## 2020-02-13 DIAGNOSIS — I69320 Aphasia following cerebral infarction: Secondary | ICD-10-CM | POA: Diagnosis not present

## 2020-02-13 DIAGNOSIS — R1310 Dysphagia, unspecified: Secondary | ICD-10-CM | POA: Diagnosis not present

## 2020-02-13 DIAGNOSIS — Z87440 Personal history of urinary (tract) infections: Secondary | ICD-10-CM | POA: Diagnosis not present

## 2020-02-13 DIAGNOSIS — Z466 Encounter for fitting and adjustment of urinary device: Secondary | ICD-10-CM | POA: Diagnosis not present

## 2020-02-13 DIAGNOSIS — D631 Anemia in chronic kidney disease: Secondary | ICD-10-CM | POA: Diagnosis not present

## 2020-02-13 DIAGNOSIS — N139 Obstructive and reflux uropathy, unspecified: Secondary | ICD-10-CM | POA: Diagnosis not present

## 2020-02-13 DIAGNOSIS — M419 Scoliosis, unspecified: Secondary | ICD-10-CM | POA: Diagnosis not present

## 2020-02-13 DIAGNOSIS — I5032 Chronic diastolic (congestive) heart failure: Secondary | ICD-10-CM | POA: Diagnosis not present

## 2020-02-13 DIAGNOSIS — G4733 Obstructive sleep apnea (adult) (pediatric): Secondary | ICD-10-CM | POA: Diagnosis not present

## 2020-02-13 DIAGNOSIS — N184 Chronic kidney disease, stage 4 (severe): Secondary | ICD-10-CM | POA: Diagnosis not present

## 2020-02-13 DIAGNOSIS — I2510 Atherosclerotic heart disease of native coronary artery without angina pectoris: Secondary | ICD-10-CM | POA: Diagnosis not present

## 2020-02-13 DIAGNOSIS — E43 Unspecified severe protein-calorie malnutrition: Secondary | ICD-10-CM | POA: Diagnosis not present

## 2020-02-13 DIAGNOSIS — M1990 Unspecified osteoarthritis, unspecified site: Secondary | ICD-10-CM | POA: Diagnosis not present

## 2020-02-19 DIAGNOSIS — G8929 Other chronic pain: Secondary | ICD-10-CM | POA: Diagnosis not present

## 2020-02-19 DIAGNOSIS — I69351 Hemiplegia and hemiparesis following cerebral infarction affecting right dominant side: Secondary | ICD-10-CM | POA: Diagnosis not present

## 2020-02-19 DIAGNOSIS — I5032 Chronic diastolic (congestive) heart failure: Secondary | ICD-10-CM | POA: Diagnosis not present

## 2020-02-19 DIAGNOSIS — N139 Obstructive and reflux uropathy, unspecified: Secondary | ICD-10-CM | POA: Diagnosis not present

## 2020-02-19 DIAGNOSIS — L89313 Pressure ulcer of right buttock, stage 3: Secondary | ICD-10-CM | POA: Diagnosis not present

## 2020-02-19 DIAGNOSIS — Z466 Encounter for fitting and adjustment of urinary device: Secondary | ICD-10-CM | POA: Diagnosis not present

## 2020-02-19 DIAGNOSIS — D631 Anemia in chronic kidney disease: Secondary | ICD-10-CM | POA: Diagnosis not present

## 2020-02-19 DIAGNOSIS — N184 Chronic kidney disease, stage 4 (severe): Secondary | ICD-10-CM | POA: Diagnosis not present

## 2020-02-19 DIAGNOSIS — G4733 Obstructive sleep apnea (adult) (pediatric): Secondary | ICD-10-CM | POA: Diagnosis not present

## 2020-02-19 DIAGNOSIS — Z7401 Bed confinement status: Secondary | ICD-10-CM | POA: Diagnosis not present

## 2020-02-19 DIAGNOSIS — M419 Scoliosis, unspecified: Secondary | ICD-10-CM | POA: Diagnosis not present

## 2020-02-19 DIAGNOSIS — E43 Unspecified severe protein-calorie malnutrition: Secondary | ICD-10-CM | POA: Diagnosis not present

## 2020-02-19 DIAGNOSIS — E7800 Pure hypercholesterolemia, unspecified: Secondary | ICD-10-CM | POA: Diagnosis not present

## 2020-02-19 DIAGNOSIS — I252 Old myocardial infarction: Secondary | ICD-10-CM | POA: Diagnosis not present

## 2020-02-19 DIAGNOSIS — Z87440 Personal history of urinary (tract) infections: Secondary | ICD-10-CM | POA: Diagnosis not present

## 2020-02-19 DIAGNOSIS — Z9181 History of falling: Secondary | ICD-10-CM | POA: Diagnosis not present

## 2020-02-19 DIAGNOSIS — R1310 Dysphagia, unspecified: Secondary | ICD-10-CM | POA: Diagnosis not present

## 2020-02-19 DIAGNOSIS — I130 Hypertensive heart and chronic kidney disease with heart failure and stage 1 through stage 4 chronic kidney disease, or unspecified chronic kidney disease: Secondary | ICD-10-CM | POA: Diagnosis not present

## 2020-02-19 DIAGNOSIS — I2510 Atherosclerotic heart disease of native coronary artery without angina pectoris: Secondary | ICD-10-CM | POA: Diagnosis not present

## 2020-02-19 DIAGNOSIS — Z79891 Long term (current) use of opiate analgesic: Secondary | ICD-10-CM | POA: Diagnosis not present

## 2020-02-19 DIAGNOSIS — E1122 Type 2 diabetes mellitus with diabetic chronic kidney disease: Secondary | ICD-10-CM | POA: Diagnosis not present

## 2020-02-19 DIAGNOSIS — I69320 Aphasia following cerebral infarction: Secondary | ICD-10-CM | POA: Diagnosis not present

## 2020-02-19 DIAGNOSIS — I69391 Dysphagia following cerebral infarction: Secondary | ICD-10-CM | POA: Diagnosis not present

## 2020-02-19 DIAGNOSIS — M1990 Unspecified osteoarthritis, unspecified site: Secondary | ICD-10-CM | POA: Diagnosis not present

## 2020-02-19 DIAGNOSIS — J439 Emphysema, unspecified: Secondary | ICD-10-CM | POA: Diagnosis not present

## 2020-02-20 DIAGNOSIS — N319 Neuromuscular dysfunction of bladder, unspecified: Secondary | ICD-10-CM | POA: Diagnosis not present

## 2020-02-20 DIAGNOSIS — R339 Retention of urine, unspecified: Secondary | ICD-10-CM | POA: Diagnosis not present

## 2020-02-20 DIAGNOSIS — Z466 Encounter for fitting and adjustment of urinary device: Secondary | ICD-10-CM | POA: Diagnosis not present

## 2020-02-22 DIAGNOSIS — J449 Chronic obstructive pulmonary disease, unspecified: Secondary | ICD-10-CM | POA: Diagnosis not present

## 2020-02-23 DIAGNOSIS — I69320 Aphasia following cerebral infarction: Secondary | ICD-10-CM | POA: Diagnosis not present

## 2020-02-23 DIAGNOSIS — E43 Unspecified severe protein-calorie malnutrition: Secondary | ICD-10-CM | POA: Diagnosis not present

## 2020-02-23 DIAGNOSIS — I130 Hypertensive heart and chronic kidney disease with heart failure and stage 1 through stage 4 chronic kidney disease, or unspecified chronic kidney disease: Secondary | ICD-10-CM | POA: Diagnosis not present

## 2020-02-23 DIAGNOSIS — Z7401 Bed confinement status: Secondary | ICD-10-CM | POA: Diagnosis not present

## 2020-02-23 DIAGNOSIS — I2510 Atherosclerotic heart disease of native coronary artery without angina pectoris: Secondary | ICD-10-CM | POA: Diagnosis not present

## 2020-02-23 DIAGNOSIS — N139 Obstructive and reflux uropathy, unspecified: Secondary | ICD-10-CM | POA: Diagnosis not present

## 2020-02-23 DIAGNOSIS — M1990 Unspecified osteoarthritis, unspecified site: Secondary | ICD-10-CM | POA: Diagnosis not present

## 2020-02-23 DIAGNOSIS — G4733 Obstructive sleep apnea (adult) (pediatric): Secondary | ICD-10-CM | POA: Diagnosis not present

## 2020-02-23 DIAGNOSIS — I69391 Dysphagia following cerebral infarction: Secondary | ICD-10-CM | POA: Diagnosis not present

## 2020-02-23 DIAGNOSIS — N184 Chronic kidney disease, stage 4 (severe): Secondary | ICD-10-CM | POA: Diagnosis not present

## 2020-02-23 DIAGNOSIS — D631 Anemia in chronic kidney disease: Secondary | ICD-10-CM | POA: Diagnosis not present

## 2020-02-23 DIAGNOSIS — L89313 Pressure ulcer of right buttock, stage 3: Secondary | ICD-10-CM | POA: Diagnosis not present

## 2020-02-23 DIAGNOSIS — J439 Emphysema, unspecified: Secondary | ICD-10-CM | POA: Diagnosis not present

## 2020-02-23 DIAGNOSIS — Z466 Encounter for fitting and adjustment of urinary device: Secondary | ICD-10-CM | POA: Diagnosis not present

## 2020-02-23 DIAGNOSIS — Z79891 Long term (current) use of opiate analgesic: Secondary | ICD-10-CM | POA: Diagnosis not present

## 2020-02-23 DIAGNOSIS — M419 Scoliosis, unspecified: Secondary | ICD-10-CM | POA: Diagnosis not present

## 2020-02-23 DIAGNOSIS — Z9181 History of falling: Secondary | ICD-10-CM | POA: Diagnosis not present

## 2020-02-23 DIAGNOSIS — I69351 Hemiplegia and hemiparesis following cerebral infarction affecting right dominant side: Secondary | ICD-10-CM | POA: Diagnosis not present

## 2020-02-23 DIAGNOSIS — Z87440 Personal history of urinary (tract) infections: Secondary | ICD-10-CM | POA: Diagnosis not present

## 2020-02-23 DIAGNOSIS — R1310 Dysphagia, unspecified: Secondary | ICD-10-CM | POA: Diagnosis not present

## 2020-02-23 DIAGNOSIS — E7800 Pure hypercholesterolemia, unspecified: Secondary | ICD-10-CM | POA: Diagnosis not present

## 2020-02-23 DIAGNOSIS — G8929 Other chronic pain: Secondary | ICD-10-CM | POA: Diagnosis not present

## 2020-02-23 DIAGNOSIS — I5032 Chronic diastolic (congestive) heart failure: Secondary | ICD-10-CM | POA: Diagnosis not present

## 2020-02-23 DIAGNOSIS — E1122 Type 2 diabetes mellitus with diabetic chronic kidney disease: Secondary | ICD-10-CM | POA: Diagnosis not present

## 2020-02-23 DIAGNOSIS — I252 Old myocardial infarction: Secondary | ICD-10-CM | POA: Diagnosis not present

## 2020-02-26 DIAGNOSIS — Z79891 Long term (current) use of opiate analgesic: Secondary | ICD-10-CM | POA: Diagnosis not present

## 2020-02-26 DIAGNOSIS — I252 Old myocardial infarction: Secondary | ICD-10-CM | POA: Diagnosis not present

## 2020-02-26 DIAGNOSIS — I69351 Hemiplegia and hemiparesis following cerebral infarction affecting right dominant side: Secondary | ICD-10-CM | POA: Diagnosis not present

## 2020-02-26 DIAGNOSIS — N184 Chronic kidney disease, stage 4 (severe): Secondary | ICD-10-CM | POA: Diagnosis not present

## 2020-02-26 DIAGNOSIS — Z87440 Personal history of urinary (tract) infections: Secondary | ICD-10-CM | POA: Diagnosis not present

## 2020-02-26 DIAGNOSIS — Z466 Encounter for fitting and adjustment of urinary device: Secondary | ICD-10-CM | POA: Diagnosis not present

## 2020-02-26 DIAGNOSIS — I2510 Atherosclerotic heart disease of native coronary artery without angina pectoris: Secondary | ICD-10-CM | POA: Diagnosis not present

## 2020-02-26 DIAGNOSIS — I130 Hypertensive heart and chronic kidney disease with heart failure and stage 1 through stage 4 chronic kidney disease, or unspecified chronic kidney disease: Secondary | ICD-10-CM | POA: Diagnosis not present

## 2020-02-26 DIAGNOSIS — E7800 Pure hypercholesterolemia, unspecified: Secondary | ICD-10-CM | POA: Diagnosis not present

## 2020-02-26 DIAGNOSIS — R1310 Dysphagia, unspecified: Secondary | ICD-10-CM | POA: Diagnosis not present

## 2020-02-26 DIAGNOSIS — L89313 Pressure ulcer of right buttock, stage 3: Secondary | ICD-10-CM | POA: Diagnosis not present

## 2020-02-26 DIAGNOSIS — G4733 Obstructive sleep apnea (adult) (pediatric): Secondary | ICD-10-CM | POA: Diagnosis not present

## 2020-02-26 DIAGNOSIS — N139 Obstructive and reflux uropathy, unspecified: Secondary | ICD-10-CM | POA: Diagnosis not present

## 2020-02-26 DIAGNOSIS — G8929 Other chronic pain: Secondary | ICD-10-CM | POA: Diagnosis not present

## 2020-02-26 DIAGNOSIS — E43 Unspecified severe protein-calorie malnutrition: Secondary | ICD-10-CM | POA: Diagnosis not present

## 2020-02-26 DIAGNOSIS — E1122 Type 2 diabetes mellitus with diabetic chronic kidney disease: Secondary | ICD-10-CM | POA: Diagnosis not present

## 2020-02-26 DIAGNOSIS — I5032 Chronic diastolic (congestive) heart failure: Secondary | ICD-10-CM | POA: Diagnosis not present

## 2020-02-26 DIAGNOSIS — D631 Anemia in chronic kidney disease: Secondary | ICD-10-CM | POA: Diagnosis not present

## 2020-02-26 DIAGNOSIS — Z7401 Bed confinement status: Secondary | ICD-10-CM | POA: Diagnosis not present

## 2020-02-26 DIAGNOSIS — I69320 Aphasia following cerebral infarction: Secondary | ICD-10-CM | POA: Diagnosis not present

## 2020-02-26 DIAGNOSIS — Z9181 History of falling: Secondary | ICD-10-CM | POA: Diagnosis not present

## 2020-02-26 DIAGNOSIS — M419 Scoliosis, unspecified: Secondary | ICD-10-CM | POA: Diagnosis not present

## 2020-02-26 DIAGNOSIS — I69391 Dysphagia following cerebral infarction: Secondary | ICD-10-CM | POA: Diagnosis not present

## 2020-02-26 DIAGNOSIS — J439 Emphysema, unspecified: Secondary | ICD-10-CM | POA: Diagnosis not present

## 2020-02-26 DIAGNOSIS — M1990 Unspecified osteoarthritis, unspecified site: Secondary | ICD-10-CM | POA: Diagnosis not present

## 2020-02-27 DIAGNOSIS — E119 Type 2 diabetes mellitus without complications: Secondary | ICD-10-CM | POA: Diagnosis not present

## 2020-02-27 DIAGNOSIS — G8111 Spastic hemiplegia affecting right dominant side: Secondary | ICD-10-CM | POA: Diagnosis not present

## 2020-02-27 DIAGNOSIS — I639 Cerebral infarction, unspecified: Secondary | ICD-10-CM | POA: Diagnosis not present

## 2020-02-28 DIAGNOSIS — M79672 Pain in left foot: Secondary | ICD-10-CM | POA: Diagnosis not present

## 2020-02-28 DIAGNOSIS — M79641 Pain in right hand: Secondary | ICD-10-CM | POA: Diagnosis not present

## 2020-02-28 DIAGNOSIS — M79642 Pain in left hand: Secondary | ICD-10-CM | POA: Diagnosis not present

## 2020-02-28 DIAGNOSIS — M545 Low back pain, unspecified: Secondary | ICD-10-CM | POA: Diagnosis not present

## 2020-02-28 DIAGNOSIS — M542 Cervicalgia: Secondary | ICD-10-CM | POA: Diagnosis not present

## 2020-02-28 DIAGNOSIS — M79605 Pain in left leg: Secondary | ICD-10-CM | POA: Diagnosis not present

## 2020-02-28 DIAGNOSIS — M79671 Pain in right foot: Secondary | ICD-10-CM | POA: Diagnosis not present

## 2020-02-28 DIAGNOSIS — M79604 Pain in right leg: Secondary | ICD-10-CM | POA: Diagnosis not present

## 2020-02-28 DIAGNOSIS — G8929 Other chronic pain: Secondary | ICD-10-CM | POA: Diagnosis not present

## 2020-02-29 DIAGNOSIS — U071 COVID-19: Secondary | ICD-10-CM | POA: Diagnosis not present

## 2020-03-07 DIAGNOSIS — I69320 Aphasia following cerebral infarction: Secondary | ICD-10-CM | POA: Diagnosis not present

## 2020-03-07 DIAGNOSIS — I69351 Hemiplegia and hemiparesis following cerebral infarction affecting right dominant side: Secondary | ICD-10-CM | POA: Diagnosis not present

## 2020-03-07 DIAGNOSIS — I252 Old myocardial infarction: Secondary | ICD-10-CM | POA: Diagnosis not present

## 2020-03-07 DIAGNOSIS — E1122 Type 2 diabetes mellitus with diabetic chronic kidney disease: Secondary | ICD-10-CM | POA: Diagnosis not present

## 2020-03-07 DIAGNOSIS — I130 Hypertensive heart and chronic kidney disease with heart failure and stage 1 through stage 4 chronic kidney disease, or unspecified chronic kidney disease: Secondary | ICD-10-CM | POA: Diagnosis not present

## 2020-03-07 DIAGNOSIS — G8929 Other chronic pain: Secondary | ICD-10-CM | POA: Diagnosis not present

## 2020-03-07 DIAGNOSIS — M1990 Unspecified osteoarthritis, unspecified site: Secondary | ICD-10-CM | POA: Diagnosis not present

## 2020-03-07 DIAGNOSIS — I5032 Chronic diastolic (congestive) heart failure: Secondary | ICD-10-CM | POA: Diagnosis not present

## 2020-03-07 DIAGNOSIS — L89313 Pressure ulcer of right buttock, stage 3: Secondary | ICD-10-CM | POA: Diagnosis not present

## 2020-03-07 DIAGNOSIS — I2510 Atherosclerotic heart disease of native coronary artery without angina pectoris: Secondary | ICD-10-CM | POA: Diagnosis not present

## 2020-03-07 DIAGNOSIS — E7800 Pure hypercholesterolemia, unspecified: Secondary | ICD-10-CM | POA: Diagnosis not present

## 2020-03-07 DIAGNOSIS — E43 Unspecified severe protein-calorie malnutrition: Secondary | ICD-10-CM | POA: Diagnosis not present

## 2020-03-07 DIAGNOSIS — Z7401 Bed confinement status: Secondary | ICD-10-CM | POA: Diagnosis not present

## 2020-03-07 DIAGNOSIS — R1310 Dysphagia, unspecified: Secondary | ICD-10-CM | POA: Diagnosis not present

## 2020-03-07 DIAGNOSIS — D631 Anemia in chronic kidney disease: Secondary | ICD-10-CM | POA: Diagnosis not present

## 2020-03-07 DIAGNOSIS — Z87440 Personal history of urinary (tract) infections: Secondary | ICD-10-CM | POA: Diagnosis not present

## 2020-03-07 DIAGNOSIS — Z466 Encounter for fitting and adjustment of urinary device: Secondary | ICD-10-CM | POA: Diagnosis not present

## 2020-03-07 DIAGNOSIS — N184 Chronic kidney disease, stage 4 (severe): Secondary | ICD-10-CM | POA: Diagnosis not present

## 2020-03-07 DIAGNOSIS — Z79891 Long term (current) use of opiate analgesic: Secondary | ICD-10-CM | POA: Diagnosis not present

## 2020-03-07 DIAGNOSIS — J439 Emphysema, unspecified: Secondary | ICD-10-CM | POA: Diagnosis not present

## 2020-03-07 DIAGNOSIS — G4733 Obstructive sleep apnea (adult) (pediatric): Secondary | ICD-10-CM | POA: Diagnosis not present

## 2020-03-07 DIAGNOSIS — I69391 Dysphagia following cerebral infarction: Secondary | ICD-10-CM | POA: Diagnosis not present

## 2020-03-07 DIAGNOSIS — Z9181 History of falling: Secondary | ICD-10-CM | POA: Diagnosis not present

## 2020-03-07 DIAGNOSIS — M419 Scoliosis, unspecified: Secondary | ICD-10-CM | POA: Diagnosis not present

## 2020-03-07 DIAGNOSIS — N139 Obstructive and reflux uropathy, unspecified: Secondary | ICD-10-CM | POA: Diagnosis not present

## 2020-03-08 ENCOUNTER — Other Ambulatory Visit: Payer: Self-pay

## 2020-03-08 ENCOUNTER — Inpatient Hospital Stay (HOSPITAL_COMMUNITY): Payer: Medicare Other

## 2020-03-08 ENCOUNTER — Encounter (HOSPITAL_COMMUNITY): Payer: Self-pay | Admitting: Emergency Medicine

## 2020-03-08 ENCOUNTER — Emergency Department (HOSPITAL_COMMUNITY): Payer: Medicare Other

## 2020-03-08 ENCOUNTER — Inpatient Hospital Stay (HOSPITAL_COMMUNITY)
Admission: EM | Admit: 2020-03-08 | Discharge: 2020-03-14 | DRG: 698 | Disposition: A | Discharge status: Hospice | Payer: Medicare Other | Attending: Internal Medicine | Admitting: Internal Medicine

## 2020-03-08 DIAGNOSIS — E119 Type 2 diabetes mellitus without complications: Secondary | ICD-10-CM | POA: Diagnosis not present

## 2020-03-08 DIAGNOSIS — N319 Neuromuscular dysfunction of bladder, unspecified: Secondary | ICD-10-CM | POA: Diagnosis not present

## 2020-03-08 DIAGNOSIS — E1122 Type 2 diabetes mellitus with diabetic chronic kidney disease: Secondary | ICD-10-CM | POA: Diagnosis present

## 2020-03-08 DIAGNOSIS — I252 Old myocardial infarction: Secondary | ICD-10-CM

## 2020-03-08 DIAGNOSIS — Z7401 Bed confinement status: Secondary | ICD-10-CM

## 2020-03-08 DIAGNOSIS — Z7189 Other specified counseling: Secondary | ICD-10-CM | POA: Diagnosis not present

## 2020-03-08 DIAGNOSIS — I2510 Atherosclerotic heart disease of native coronary artery without angina pectoris: Secondary | ICD-10-CM | POA: Diagnosis present

## 2020-03-08 DIAGNOSIS — A419 Sepsis, unspecified organism: Secondary | ICD-10-CM | POA: Diagnosis not present

## 2020-03-08 DIAGNOSIS — I69320 Aphasia following cerebral infarction: Secondary | ICD-10-CM | POA: Diagnosis not present

## 2020-03-08 DIAGNOSIS — K3189 Other diseases of stomach and duodenum: Secondary | ICD-10-CM | POA: Diagnosis not present

## 2020-03-08 DIAGNOSIS — I130 Hypertensive heart and chronic kidney disease with heart failure and stage 1 through stage 4 chronic kidney disease, or unspecified chronic kidney disease: Secondary | ICD-10-CM | POA: Diagnosis not present

## 2020-03-08 DIAGNOSIS — M419 Scoliosis, unspecified: Secondary | ICD-10-CM | POA: Diagnosis not present

## 2020-03-08 DIAGNOSIS — R531 Weakness: Secondary | ICD-10-CM | POA: Diagnosis not present

## 2020-03-08 DIAGNOSIS — R5381 Other malaise: Secondary | ICD-10-CM | POA: Diagnosis not present

## 2020-03-08 DIAGNOSIS — Z955 Presence of coronary angioplasty implant and graft: Secondary | ICD-10-CM

## 2020-03-08 DIAGNOSIS — K5909 Other constipation: Secondary | ICD-10-CM | POA: Diagnosis not present

## 2020-03-08 DIAGNOSIS — J449 Chronic obstructive pulmonary disease, unspecified: Secondary | ICD-10-CM | POA: Diagnosis not present

## 2020-03-08 DIAGNOSIS — I69359 Hemiplegia and hemiparesis following cerebral infarction affecting unspecified side: Secondary | ICD-10-CM | POA: Diagnosis not present

## 2020-03-08 DIAGNOSIS — Z0189 Encounter for other specified special examinations: Secondary | ICD-10-CM

## 2020-03-08 DIAGNOSIS — R54 Age-related physical debility: Secondary | ICD-10-CM | POA: Diagnosis present

## 2020-03-08 DIAGNOSIS — Z66 Do not resuscitate: Secondary | ICD-10-CM | POA: Diagnosis not present

## 2020-03-08 DIAGNOSIS — R0902 Hypoxemia: Secondary | ICD-10-CM | POA: Diagnosis not present

## 2020-03-08 DIAGNOSIS — R339 Retention of urine, unspecified: Secondary | ICD-10-CM | POA: Diagnosis not present

## 2020-03-08 DIAGNOSIS — G8929 Other chronic pain: Secondary | ICD-10-CM | POA: Diagnosis not present

## 2020-03-08 DIAGNOSIS — J439 Emphysema, unspecified: Secondary | ICD-10-CM | POA: Diagnosis not present

## 2020-03-08 DIAGNOSIS — D631 Anemia in chronic kidney disease: Secondary | ICD-10-CM | POA: Diagnosis present

## 2020-03-08 DIAGNOSIS — Z880 Allergy status to penicillin: Secondary | ICD-10-CM

## 2020-03-08 DIAGNOSIS — T83511A Infection and inflammatory reaction due to indwelling urethral catheter, initial encounter: Principal | ICD-10-CM | POA: Diagnosis present

## 2020-03-08 DIAGNOSIS — M47816 Spondylosis without myelopathy or radiculopathy, lumbar region: Secondary | ICD-10-CM | POA: Diagnosis not present

## 2020-03-08 DIAGNOSIS — G4733 Obstructive sleep apnea (adult) (pediatric): Secondary | ICD-10-CM | POA: Diagnosis present

## 2020-03-08 DIAGNOSIS — R6520 Severe sepsis without septic shock: Secondary | ICD-10-CM | POA: Diagnosis not present

## 2020-03-08 DIAGNOSIS — Z4682 Encounter for fitting and adjustment of non-vascular catheter: Secondary | ICD-10-CM | POA: Diagnosis not present

## 2020-03-08 DIAGNOSIS — G8191 Hemiplegia, unspecified affecting right dominant side: Secondary | ICD-10-CM | POA: Diagnosis not present

## 2020-03-08 DIAGNOSIS — G9389 Other specified disorders of brain: Secondary | ICD-10-CM | POA: Diagnosis not present

## 2020-03-08 DIAGNOSIS — Y846 Urinary catheterization as the cause of abnormal reaction of the patient, or of later complication, without mention of misadventure at the time of the procedure: Secondary | ICD-10-CM | POA: Diagnosis present

## 2020-03-08 DIAGNOSIS — R1110 Vomiting, unspecified: Secondary | ICD-10-CM

## 2020-03-08 DIAGNOSIS — Z823 Family history of stroke: Secondary | ICD-10-CM

## 2020-03-08 DIAGNOSIS — L89152 Pressure ulcer of sacral region, stage 2: Secondary | ICD-10-CM | POA: Diagnosis not present

## 2020-03-08 DIAGNOSIS — M2550 Pain in unspecified joint: Secondary | ICD-10-CM | POA: Diagnosis not present

## 2020-03-08 DIAGNOSIS — F419 Anxiety disorder, unspecified: Secondary | ICD-10-CM | POA: Diagnosis present

## 2020-03-08 DIAGNOSIS — Z8616 Personal history of COVID-19: Secondary | ICD-10-CM

## 2020-03-08 DIAGNOSIS — L8990 Pressure ulcer of unspecified site, unspecified stage: Secondary | ICD-10-CM | POA: Diagnosis present

## 2020-03-08 DIAGNOSIS — M1611 Unilateral primary osteoarthritis, right hip: Secondary | ICD-10-CM | POA: Diagnosis not present

## 2020-03-08 DIAGNOSIS — I69351 Hemiplegia and hemiparesis following cerebral infarction affecting right dominant side: Secondary | ICD-10-CM

## 2020-03-08 DIAGNOSIS — K56609 Unspecified intestinal obstruction, unspecified as to partial versus complete obstruction: Secondary | ICD-10-CM | POA: Diagnosis present

## 2020-03-08 DIAGNOSIS — Z79899 Other long term (current) drug therapy: Secondary | ICD-10-CM

## 2020-03-08 DIAGNOSIS — K56600 Partial intestinal obstruction, unspecified as to cause: Secondary | ICD-10-CM | POA: Diagnosis not present

## 2020-03-08 DIAGNOSIS — Z466 Encounter for fitting and adjustment of urinary device: Secondary | ICD-10-CM | POA: Diagnosis not present

## 2020-03-08 DIAGNOSIS — E7800 Pure hypercholesterolemia, unspecified: Secondary | ICD-10-CM | POA: Diagnosis present

## 2020-03-08 DIAGNOSIS — N390 Urinary tract infection, site not specified: Secondary | ICD-10-CM | POA: Diagnosis not present

## 2020-03-08 DIAGNOSIS — Z87891 Personal history of nicotine dependence: Secondary | ICD-10-CM

## 2020-03-08 DIAGNOSIS — E785 Hyperlipidemia, unspecified: Secondary | ICD-10-CM | POA: Diagnosis not present

## 2020-03-08 DIAGNOSIS — K5669 Other partial intestinal obstruction: Secondary | ICD-10-CM | POA: Diagnosis not present

## 2020-03-08 DIAGNOSIS — G9340 Encephalopathy, unspecified: Secondary | ICD-10-CM | POA: Diagnosis not present

## 2020-03-08 DIAGNOSIS — N184 Chronic kidney disease, stage 4 (severe): Secondary | ICD-10-CM | POA: Diagnosis present

## 2020-03-08 DIAGNOSIS — Z833 Family history of diabetes mellitus: Secondary | ICD-10-CM

## 2020-03-08 DIAGNOSIS — K529 Noninfective gastroenteritis and colitis, unspecified: Secondary | ICD-10-CM | POA: Diagnosis present

## 2020-03-08 DIAGNOSIS — K56699 Other intestinal obstruction unspecified as to partial versus complete obstruction: Secondary | ICD-10-CM | POA: Diagnosis not present

## 2020-03-08 DIAGNOSIS — I6782 Cerebral ischemia: Secondary | ICD-10-CM | POA: Diagnosis not present

## 2020-03-08 DIAGNOSIS — E872 Acidosis: Secondary | ICD-10-CM | POA: Diagnosis present

## 2020-03-08 DIAGNOSIS — Z515 Encounter for palliative care: Secondary | ICD-10-CM

## 2020-03-08 DIAGNOSIS — Z87442 Personal history of urinary calculi: Secondary | ICD-10-CM

## 2020-03-08 DIAGNOSIS — I5032 Chronic diastolic (congestive) heart failure: Secondary | ICD-10-CM | POA: Diagnosis present

## 2020-03-08 DIAGNOSIS — R000 Tachycardia, unspecified: Secondary | ICD-10-CM | POA: Diagnosis not present

## 2020-03-08 DIAGNOSIS — Z4659 Encounter for fitting and adjustment of other gastrointestinal appliance and device: Secondary | ICD-10-CM | POA: Diagnosis not present

## 2020-03-08 DIAGNOSIS — I69391 Dysphagia following cerebral infarction: Secondary | ICD-10-CM | POA: Diagnosis not present

## 2020-03-08 DIAGNOSIS — I6389 Other cerebral infarction: Secondary | ICD-10-CM | POA: Diagnosis not present

## 2020-03-08 DIAGNOSIS — I509 Heart failure, unspecified: Secondary | ICD-10-CM | POA: Diagnosis present

## 2020-03-08 DIAGNOSIS — G319 Degenerative disease of nervous system, unspecified: Secondary | ICD-10-CM | POA: Diagnosis not present

## 2020-03-08 DIAGNOSIS — R4182 Altered mental status, unspecified: Secondary | ICD-10-CM | POA: Diagnosis not present

## 2020-03-08 DIAGNOSIS — N189 Chronic kidney disease, unspecified: Secondary | ICD-10-CM | POA: Diagnosis not present

## 2020-03-08 DIAGNOSIS — R0689 Other abnormalities of breathing: Secondary | ICD-10-CM | POA: Diagnosis not present

## 2020-03-08 DIAGNOSIS — G8100 Flaccid hemiplegia affecting unspecified side: Secondary | ICD-10-CM | POA: Diagnosis not present

## 2020-03-08 DIAGNOSIS — G8190 Hemiplegia, unspecified affecting unspecified side: Secondary | ICD-10-CM | POA: Diagnosis not present

## 2020-03-08 DIAGNOSIS — K5939 Other megacolon: Secondary | ICD-10-CM | POA: Diagnosis not present

## 2020-03-08 DIAGNOSIS — Z9049 Acquired absence of other specified parts of digestive tract: Secondary | ICD-10-CM | POA: Diagnosis not present

## 2020-03-08 DIAGNOSIS — K6389 Other specified diseases of intestine: Secondary | ICD-10-CM | POA: Diagnosis not present

## 2020-03-08 DIAGNOSIS — Z0389 Encounter for observation for other suspected diseases and conditions ruled out: Secondary | ICD-10-CM | POA: Diagnosis not present

## 2020-03-08 LAB — CBC WITH DIFFERENTIAL/PLATELET
Abs Immature Granulocytes: 0.07 10*3/uL (ref 0.00–0.07)
Basophils Absolute: 0 10*3/uL (ref 0.0–0.1)
Basophils Relative: 0 %
Eosinophils Absolute: 0 10*3/uL (ref 0.0–0.5)
Eosinophils Relative: 0 %
HCT: 39.8 % (ref 36.0–46.0)
Hemoglobin: 12 g/dL (ref 12.0–15.0)
Immature Granulocytes: 1 %
Lymphocytes Relative: 10 %
Lymphs Abs: 1.4 10*3/uL (ref 0.7–4.0)
MCH: 29.4 pg (ref 26.0–34.0)
MCHC: 30.2 g/dL (ref 30.0–36.0)
MCV: 97.5 fL (ref 80.0–100.0)
Monocytes Absolute: 0.5 10*3/uL (ref 0.1–1.0)
Monocytes Relative: 4 %
Neutro Abs: 11.6 10*3/uL — ABNORMAL HIGH (ref 1.7–7.7)
Neutrophils Relative %: 85 %
Platelets: 257 10*3/uL (ref 150–400)
RBC: 4.08 MIL/uL (ref 3.87–5.11)
RDW: 17 % — ABNORMAL HIGH (ref 11.5–15.5)
WBC: 13.6 10*3/uL — ABNORMAL HIGH (ref 4.0–10.5)
nRBC: 0 % (ref 0.0–0.2)

## 2020-03-08 LAB — URINALYSIS, ROUTINE W REFLEX MICROSCOPIC
Bilirubin Urine: NEGATIVE
Glucose, UA: NEGATIVE mg/dL
Hgb urine dipstick: NEGATIVE
Ketones, ur: NEGATIVE mg/dL
Nitrite: NEGATIVE
Protein, ur: NEGATIVE mg/dL
Specific Gravity, Urine: 1.015 (ref 1.005–1.030)
pH: 5 (ref 5.0–8.0)

## 2020-03-08 LAB — URINALYSIS, MICROSCOPIC (REFLEX)

## 2020-03-08 LAB — LACTIC ACID, PLASMA
Lactic Acid, Venous: 3.5 mmol/L (ref 0.5–1.9)
Lactic Acid, Venous: 3.3 mmol/L (ref 0.5–1.9)

## 2020-03-08 LAB — COMPREHENSIVE METABOLIC PANEL
ALT: 11 U/L (ref 0–44)
AST: 17 U/L (ref 15–41)
Albumin: 3.6 g/dL (ref 3.5–5.0)
Alkaline Phosphatase: 61 U/L (ref 38–126)
Anion gap: 16 — ABNORMAL HIGH (ref 5–15)
BUN: 72 mg/dL — ABNORMAL HIGH (ref 8–23)
CO2: 13 mmol/L — ABNORMAL LOW (ref 22–32)
Calcium: 8.9 mg/dL (ref 8.9–10.3)
Chloride: 111 mmol/L (ref 98–111)
Creatinine, Ser: 2.06 mg/dL — ABNORMAL HIGH (ref 0.44–1.00)
GFR, Estimated: 26 mL/min — ABNORMAL LOW (ref >60)
Glucose, Bld: 158 mg/dL — ABNORMAL HIGH (ref 70–99)
Potassium: 4.6 mmol/L (ref 3.5–5.1)
Sodium: 140 mmol/L (ref 135–145)
Total Bilirubin: 0.9 mg/dL (ref 0.3–1.2)
Total Protein: 7 g/dL (ref 6.5–8.1)

## 2020-03-08 LAB — APTT: aPTT: 33 seconds (ref 24–36)

## 2020-03-08 LAB — PROTIME-INR
INR: 1.3 — ABNORMAL HIGH (ref 0.8–1.2)
Prothrombin Time: 15.4 seconds — ABNORMAL HIGH (ref 11.4–15.2)

## 2020-03-08 MED ORDER — LACTATED RINGERS IV BOLUS (SEPSIS)
250.0000 mL | Freq: Once | INTRAVENOUS | Status: AC
  Administered 2020-03-08: 250 mL via INTRAVENOUS

## 2020-03-08 MED ORDER — METRONIDAZOLE IN NACL 5-0.79 MG/ML-% IV SOLN
500.0000 mg | Freq: Once | INTRAVENOUS | Status: AC
  Administered 2020-03-08: 500 mg via INTRAVENOUS
  Filled 2020-03-08: qty 100

## 2020-03-08 MED ORDER — LACTATED RINGERS IV BOLUS (SEPSIS)
1000.0000 mL | Freq: Once | INTRAVENOUS | Status: AC
  Administered 2020-03-08: 1000 mL via INTRAVENOUS

## 2020-03-08 MED ORDER — SODIUM CHLORIDE 0.9 % IV SOLN
2.0000 g | Freq: Once | INTRAVENOUS | Status: AC
  Administered 2020-03-08: 2 g via INTRAVENOUS
  Filled 2020-03-08: qty 2

## 2020-03-08 MED ORDER — ENOXAPARIN SODIUM 30 MG/0.3ML ~~LOC~~ SOLN
30.0000 mg | SUBCUTANEOUS | Status: DC
  Administered 2020-03-09 – 2020-03-12 (×5): 30 mg via SUBCUTANEOUS
  Filled 2020-03-08 (×5): qty 0.3

## 2020-03-08 MED ORDER — SODIUM CHLORIDE 0.9 % IV SOLN
2.0000 g | INTRAVENOUS | Status: DC
  Administered 2020-03-09 – 2020-03-13 (×5): 2 g via INTRAVENOUS
  Filled 2020-03-08 (×5): qty 2

## 2020-03-08 MED ORDER — VANCOMYCIN HCL 1250 MG/250ML IV SOLN
1250.0000 mg | Freq: Once | INTRAVENOUS | Status: AC
  Administered 2020-03-08: 1250 mg via INTRAVENOUS
  Filled 2020-03-08: qty 250

## 2020-03-08 MED ORDER — SODIUM CHLORIDE 0.9 % IV SOLN
2.0000 g | Freq: Once | INTRAVENOUS | Status: DC
  Filled 2020-03-08: qty 2

## 2020-03-08 MED ORDER — DIATRIZOATE MEGLUMINE & SODIUM 66-10 % PO SOLN
90.0000 mL | Freq: Once | ORAL | Status: DC
  Filled 2020-03-08: qty 90

## 2020-03-08 MED ORDER — VANCOMYCIN HCL 1000 MG/200ML IV SOLN: 1000.0000 mg | INTRAVENOUS | Status: DC

## 2020-03-08 MED ORDER — SODIUM CHLORIDE 0.9% FLUSH
3.0000 mL | Freq: Two times a day (BID) | INTRAVENOUS | Status: DC
  Administered 2020-03-09 – 2020-03-14 (×8): 3 mL via INTRAVENOUS

## 2020-03-08 MED ORDER — SORBITOL 70 % SOLN
960.0000 mL | TOPICAL_OIL | Freq: Once | ORAL | Status: AC
  Administered 2020-03-09: 960 mL via RECTAL
  Filled 2020-03-08: qty 473

## 2020-03-08 MED ORDER — LACTATED RINGERS IV SOLN: INTRAVENOUS | Status: AC

## 2020-03-08 NOTE — ED Triage Notes (Signed)
Pt BIB [facility] w/ compaints of pt seeming more lethargic and slower to respond starting since 9 p.m. last night. Pt using talkative and able to carry on a conversation but family noted "something just wasn't right". Pt has hx of prior stroke and normally has R sided deficits. Pt bed bound due to prior stroke. Per family pt is currently being treated at home with abx for unknown type of infection. Pt's VS w/ EMS. Pt tachycardic in the 120s, BP of 80E systolic. Pts CBG 185. Fever of 101.3

## 2020-03-08 NOTE — ED Provider Notes (Addendum)
Michelle Holmes   CSN: [id number] Arrival date & time: 03/08/20  1013     History Chief Complaint  Patient presents with  . Altered Mental Status    Michelle Holmes is a 70 y.o. female.  HPI Level 41 caveat 70 year old female presents today via EMS with concern for infection.  She had fever, tachycardia, and history of stroke and sacral osteomyelitis.  Family reported that she was in her usual state of health yesterday.  This is normally bedbound with residual right-sided weakness from stroke.  However, she is conversant.  Today she is more lethargic, slower, and less conversant.  Patient has ongoing treatment with antibiotics per family report.  Prehospital patient is tachycardic to 120s 1 blood pressure systolic of 90 and fever of 101.3.   Patient with positive covid test reported by family 02/27/20.  Past Medical History:  Diagnosis Date  . Anemia   . CHF (congestive heart failure) ([place])   . Chronic back pain   . COPD (chronic obstructive pulmonary disease) ([place])   . Coronary artery disease   . Diabetes mellitus   . DJD (degenerative joint disease)   . Emphysema   . Hypercholesteremia   . Hypertension   . Myocardial infarction ([place])   . Obesity   . Obesity hypoventilation syndrome ([place])   . Renal insufficiency   . Scoliosis   . Sleep apnea   . Stroke ([place])   . TIA (transient ischemic attack)     Patient Active Problem List   Diagnosis Date Noted  . Need for immunization against influenza 02/28/2018  . Need for 23-polyvalent pneumococcal polysaccharide vaccine 02/28/2018  . Lipoma of left upper extremity 02/28/2018  . Stroke (cerebrum) ([place]) 08/04/2016  . Long-term current use of opiate analgesic 03/05/2016  . Essential hypertension 03/05/2016  . Chronic diastolic (congestive) heart failure ([place]) 01/28/2016  . Protein-calorie malnutrition, severe 01/28/2016  . Healthcare maintenance   . Palliative care by specialist    . Anemia of chronic kidney failure   . Acute urinary retention   . Chronic kidney disease (CKD), stage IV (severe) ([place]) 07/06/2015  . 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 Thrombocytopenia ([place])   . Dysarthria   . HLD (hyperlipidemia)   . Chronic obstructive pulmonary disease ([place])   . Hemiparesis, aphasia, and dysphagia as late effect of cerebrovascular accident (CVA) ([place])   . Right hemiplegia ([place])   . Lower abdominal pain 06/20/2014  . OSA on CPAP 12/08/2010  . CAD (coronary artery disease) 12/08/2010  . Controlled diabetes mellitus type 2 with complications ([place]) 48/01/6551    Past Surgical History:  Procedure Laterality Date  . bil uterscospy     08/18/16 Dr. Jeffie Pollock  . COLON SURGERY    . CORONARY ANGIOPLASTY WITH STENT PLACEMENT    . CYSTOSCOPY WITH STENT PLACEMENT Bilateral 08/04/2016   Procedure: CYSTOSCOPY WITH STENT PLACEMENT bilateral bilateral retrograde fecal disimpaction;  Surgeon: Ardis Hughs, MD;  Location: [facility];  Service: Urology;  Laterality: Bilateral;  . CYSTOSCOPY/URETEROSCOPY/HOLMIUM LASER/STENT PLACEMENT Bilateral 08/18/2016   Procedure: BILATERAL URETEROSCOPY WITH HOLMIUM LASER AND STENTS;  Surgeon: Irine Seal, MD;  Location: [facility];  Service: Urology;  Laterality: Bilateral;  . IR FLUORO GUIDE CV LINE LEFT  10/27/2018  . IR REMOVAL TUN CV CATH W/O FL  01/26/2019  . IR US GUIDE VASC ACCESS LEFT  10/27/2018     OB History   No obstetric history on file.     Family  History  Problem Relation Age of Onset  . Diabetes Mother   . Stroke Maternal Aunt     Social History   Tobacco Use  . Smoking status: Former Research scientist (life sciences)  . Smokeless tobacco: Never Used  Vaping Use  . Vaping Use: Never used  Substance Use Topics  . Alcohol use: No  . Drug use: No    Home Medications Prior to Admission medications   Medication Sig Start Date End Date Taking? Authorizing Provider  acetaminophen (TYLENOL) 500 MG tablet Take 500 mg by mouth every 6 (six)  hours as needed for moderate pain.    [provider]  amLODipine (NORVASC) 10 MG tablet Take 1 tablet (10 mg total) by mouth daily. 12/16/18   Jeanmarie Hubert, MD  atorvastatin (LIPITOR) 20 MG tablet Take 1 tablet (20 mg total) by mouth daily. 12/19/18   Jeanmarie Hubert, MD  collagenase (SANTYL) ointment Apply 1 application topically daily. 06/06/18   Lorella Nimrod, MD  Diapers & Supplies MISC Please provide patient with adult diapers and pads 06/03/16   Ophelia Shoulder, MD  feeding supplement, ENSURE COMPLETE, (ENSURE COMPLETE) LIQD Take 237 mLs by mouth 2 (two) times daily between meals. 07/08/18   Lorella Nimrod, MD  Oxycodone HCl 10 MG TABS Take 1 tablet (10 mg total) by mouth 2 (two) times daily as needed. 02/16/19   Earlene Plater, MD  Vitamin D, Ergocalciferol, (DRISDOL) 50000 units CAPS capsule Take 1 capsule (50,000 Units total) by mouth every 7 (seven) days. 8 weeks. 03/06/17   Lorella Nimrod, MD    Allergies    Penicillins  Review of Systems   Review of Systems  All other systems reviewed and are negative.   Physical Exam Updated Vital Signs BP 108/88   Pulse (!) 110   Temp (!) 101.3 F (38.5 C) (Oral)   Resp (!) 27   SpO2 100%   Physical Exam Vitals and nursing Holmes reviewed.  HENT:     Head: Normocephalic.     Right Ear: External ear normal.     Left Ear: External ear normal.     Nose: Nose normal.     Mouth/Throat:     Mouth: Mucous membranes are dry.  Eyes:     Pupils: Pupils are equal, round, and reactive to light.  Cardiovascular:     Rate and Rhythm: Tachycardia present.  Pulmonary:     Effort: Pulmonary effort is normal.     Breath sounds: Normal breath sounds.  Abdominal:     General: Abdomen is flat.     Palpations: Abdomen is soft.     Tenderness: There is abdominal tenderness.     Comments: Mild diffuse tenderness to palpation  Musculoskeletal:     Cervical back: Normal range of motion.     Comments: Patient with contractures of right side.   She has decreased range of motion of her left lower extremity that appears to be chronic on my exam She has sacral ulcers with perirectal breakdown but no apparent surrounding cellulitis or worsening  Skin:    General: Skin is warm.     Capillary Refill: Capillary refill takes less than 2 seconds.  Neurological:     Mental Status: She is alert.     Comments: Patient with ongoing right-sided weakness.  She is oriented to person and place.     ED Results / Procedures / Treatments   Labs (all labs ordered are listed, but only abnormal results are displayed) Labs Reviewed  COMPREHENSIVE METABOLIC PANEL -  Abnormal; Notable for the following components:      Result Value   CO2 13 (*)    Glucose, Bld 158 (*)    BUN 72 (*)    Creatinine, Ser 2.06 (*)    GFR, Estimated 26 (*)    Anion gap 16 (*)    All other components within normal limits  CBC WITH DIFFERENTIAL/PLATELET - Abnormal; Notable for the following components:   RDW 17.0 (*)    All other components within normal limits  PROTIME-INR - Abnormal; Notable for the following components:   Prothrombin Time 15.4 (*)    INR 1.3 (*)    All other components within normal limits  URINE CULTURE  CULTURE, BLOOD (ROUTINE X 2)  CULTURE, BLOOD (ROUTINE X 2)  APTT  LACTIC ACID, PLASMA  LACTIC ACID, PLASMA  URINALYSIS, ROUTINE W REFLEX MICROSCOPIC    EKG EKG Interpretation  Date/Time:  Friday March 08 2020 10:28:04 EST Ventricular Rate:  108 PR Interval:    QRS Duration: 70 QT Interval:  332 QTC Calculation: 445 R Axis:   35 Text Interpretation: Sinus tachycardia Low voltage, precordial leads Nonspecific T abnormalities, lateral leads Confirmed by Pattricia Boss [phone]) on 03/08/2020 12:53:22 PM   Radiology CT ABDOMEN PELVIS WO CONTRAST  Result Date: 03/08/2020 CLINICAL DATA:  Acute, unspecified abdominal pain EXAM: CT ABDOMEN AND PELVIS WITHOUT CONTRAST TECHNIQUE: Multidetector CT imaging of the abdomen and pelvis was  performed following the standard protocol without IV contrast. COMPARISON:  None. FINDINGS: Lower chest: Mild right basilar atelectasis. Extensive multi-vessel coronary artery calcification. Cardiac size within normal limits. Hepatobiliary: No focal liver abnormality is seen. Status post cholecystectomy. No biliary dilatation. Pancreas: Unremarkable Spleen: Unremarkable Adrenals/Urinary Tract: 18 mm right adrenal adenoma. Left adrenal gland is unremarkable. Kidneys are unremarkable. Foley catheter balloon is seen within a decompressed bladder lumen. Stomach/Bowel: There is a high-grade distal small-bowel obstruction with the point of transition seen within the periumbilical region at axial image # 52 and coronal image # 53. There is inspissation of intraluminal contents secondary to stasis just proximal to the point of transition with fluid-filled dilated loops of small bowel seen proximal to that. The stomach is fluid-filled and mildly distended. Moderate stool within the rectal vault. The terminal small bowel and colon are otherwise unremarkable. Appendectomy has been performed. No free intraperitoneal gas there is mild ascites present. Vascular/Lymphatic: Mild aortoiliac atherosclerotic calcification. No aortic aneurysm. No pathologic adenopathy within the abdomen and pelvis. Reproductive: Status post hysterectomy. No adnexal masses. Other: No abdominal wall hernia. Moderate diffuse subcutaneous body wall edema in keeping with anasarca. Musculoskeletal: Moderate to severe thoracolumbar scoliosis. Extensive heterotopic ossification surrounding the right hip. No acute bone abnormality within the abdomen and pelvis. IMPRESSION: Distal high-grade small bowel obstruction with single point of transition within the periumbilical region. The proximal small bowel and stomach are dilated and fluid-filled. Extensive multi-vessel coronary artery calcification. Moderate anasarca with mild ascites and diffuse body wall  subcutaneous edema. 18 mm right adrenal adenoma Aortic Atherosclerosis (ICD10-I70.0). Electronically Signed   By: Fidela Salisbury MD   On: 03/08/2020 13:53   CT Head Wo Contrast  Result Date: 03/08/2020 CLINICAL DATA:  Delirium. EXAM: CT HEAD WITHOUT CONTRAST TECHNIQUE: Contiguous axial images were obtained from the base of the skull through the vertex without intravenous contrast. COMPARISON:  CT head August 04, 2016. FINDINGS: Brain: No evidence of acute large vascular territory infarct. Similar appearance prior left frontal infarct and prior left basal ganglia lacunar infarcts. Similar versus slightly progressed  age-advanced patchy white matter hypoattenuation, likely related to chronic microvascular ischemic disease. Generalized cerebral atrophy. Bilateral basal ganglia calcifications. Vascular: Calcific atherosclerosis. No hyperdense vessel identified. Skull: No acute fracture. Sinuses/Orbits: Mild scattered paranasal sinus mucosal thickening. No air-fluid levels. Unremarkable orbits. Other: No mastoid effusions. Cerumen in bilateral external auditory canals. IMPRESSION: 1. No evidence of acute intracranial abnormality. MRI could provide more sensitive evaluation for acute infarct if clinically indicated. 2. Remote left frontal and left basal ganglia infarcts. 3. Similar versus slightly progressed age-advanced chronic microvascular ischemic disease and cerebral atrophy. Electronically Signed   By: Margaretha Sheffield MD   On: 03/08/2020 12:16   DG Chest Port 1 View  Result Date: 03/08/2020 CLINICAL DATA:  70 year old female with questionable sepsis EXAM: PORTABLE CHEST 1 VIEW COMPARISON:  08/17/2017 FINDINGS: Cardiomediastinal silhouette unchanged with right rotation somewhat limiting the evaluation. Tortuosity of the aorta. No pneumothorax. No pleural effusion. No interlobular septal thickening. No confluent airspace disease. No displaced fracture IMPRESSION: No definite evidence of acute cardiopulmonary  disease. Electronically Signed   By: Corrie Mckusick D.O.   On: 03/08/2020 11:05   DG Abd Portable 1V-Small Bowel Protocol-Position Verification  Result Date: 03/08/2020 CLINICAL DATA:  Nasogastric tube. EXAM: PORTABLE ABDOMEN - 1 VIEW COMPARISON:  08/17/2017 and CT, 03/08/2020. FINDINGS: Nasogastric tube passes well below the diaphragm, tip projecting in the right upper quadrant. Distended small bowel is noted in the upper abdomen. IMPRESSION: Well-positioned nasal/orogastric tube Electronically Signed   By: Lajean Manes M.D.   On: 03/08/2020 16:21    Procedures .Critical Care Performed by: Pattricia Boss, MD Authorized by: Pattricia Boss, MD   Critical care provider statement:    Critical care time (minutes):  45   Critical care end time:  03/08/2020 2:15 PM   Critical care was necessary to treat or prevent imminent or life-threatening deterioration of the following conditions:  Sepsis   Critical care was time spent personally by me on the following activities:  Discussions with consultants, evaluation of patient's response to treatment, examination of patient, ordering and performing treatments and interventions, ordering and review of laboratory studies, ordering and review of radiographic studies, pulse oximetry, re-evaluation of patient's condition, obtaining history from patient or surrogate and review of old charts   critical care performed   Medications Ordered in ED Medications - No data to display  ED Course  I have reviewed the triage vital signs and the nursing notes.  Pertinent labs & imaging results that were available during my care of the patient were reviewed by me and considered in my medical decision making (see chart for details). Patient tachycardic to 111.  Temp is 101.3.  Respiratory rate is currently normal.  Blood pressure is 113/81 before fluids.   Care discussed with daughter.  She states that patient is no chest compressions but does want intubation if  needed.  Clinical Course as of 03/08/20 1257  Fri Mar 08, 2020  1254 Leukocytosis present at 13,600  [DR]  1255 Creatinine(!): 2.06  creatinine 2.6 [DR]  1255 Creatinine.  Is improved from first prior lactic acid called to me at 1240.  Lactic acid elevated at 3.5. Broad-spectrum antibiotics and fluid resuscitation initiated and code sepsi called.  S [DR]    Clinical Course User Index [DR] Pattricia Boss, MD   MDM Rules/Calculators/A&P                          1- infection-fever evolving sepsis.  Known recent history  of covid Broad spectrum abx ordered. 30 cc/kg lr ordered Lactic acid elevated at 3.5, fluids ordered and will recheck  2- previous stroke- residual right sided weakness  3-Discussed code status with daughters and want full code at this time  4- chronic renal insufficiency- stable 5- SBO-patient with some mild abdominal ttp- ct obtained and resulted after discussion with admitting team who advised of sbo results.  Discussed with Dr.s Alfonse Spruce and [place] and they have assumed care Final Clinical Impression(s) / ED Diagnoses Final diagnoses:  Sepsis, due to unspecified organism, unspecified whether acute organ dysfunction present [facility])    Rx / DC Orders ED Discharge Orders    None       Pattricia Boss, MD 03/08/20 1415    Pattricia Boss, MD 03/08/20 1630

## 2020-03-08 NOTE — Consult Note (Signed)
[facility] Surgery Consult  Note  Michelle Holmes [birth year]  [id number].    Requesting MD: Wynona Luna Chief Complaint: Lethargic, bedbound secondary to stroke, on antibiotics, altered mental status Reason for Consult: SBO  HPI:  Patient is a 70 year old female who was brought to the ED by EMS with concerns of infection.  She was lethargic and the family said she was just not right.  Slower mentally less conversant.  Patient has a history of testing positive for COVID on 02/27/2020. Past medical history includes: Surgery, she has had history of nephrolithiasis, type 2 diabetes, CAD, Hx CHF COPD, obstructive sleep apnea with CPAP, history of hemiparesis, aphasia and dysphagia, stage IV CKD, Hx of long-term opiate analgesic use. Hx from chart, no family with her, and limited speech secondary to aphasia.  Work-up in the ED shows a fever 101.3, respiratory rate of 30, heart rate 98-115.  Blood pressure stable.  Glucose 158, BUN 72, creatinine 2.06, lactate is 3.5, WBC 13.6, H/H 12/39.8, platelets 257,000.  INR 1.3, UA is pending.  Chest x-ray shows no acute cardiopulmonary disease.  CT of the head without contrast shows no evidence of acute intracranial abnormality.  Remote left frontal and left basilar ganglia infarcts, similar versus slightly progressed age advanced chronic microvascular ischemic disease and cerebral atrophy. CT of the abdomen pelvis with without contrast: Shows extensive multivascular coronary artery calcification, mild basilar atelectasis on the right, 18 mm adrenal adenoma, there is a Foley catheter in a decompressed bladder.  There is a high-grade small bowel obstruction with a point of transition seen within the periumbilical region at axial image # 52 and coronal image # 53. There is inspissation of intraluminal contents secondary to stasis just proximal to the point of transition with fluid-filled dilated loops of small bowel seen proximal to that. The stomach is fluid-filled and  mildly distended. Moderate stool within the rectal vault. The terminal small bowel and colon are otherwise unremarkable.  There is also moderate anasarca with mild ascites and diffuse body wall subcutaneous edema noted on the CT.  Patient has been seen by medicine and admitted.  We are asked to see.  She was seen back in January 2018 with a similar problem and was treated with enemas for constipation.  It appears she was subsequently discharged after that.  ROS: Review of Systems  Unable to perform ROS: Medical condition  Constitutional: Positive for fever (101 on admit).  Pt with aphasia and difficult to understand.  No family with her currently.  Family History  Problem Relation Age of Onset  . Diabetes Mother   . Stroke Maternal Aunt     Past Medical History:  Diagnosis Date  . Anemia   . CHF (congestive heart failure) ([place])   . Chronic back pain   . COPD (chronic obstructive pulmonary disease) ([place])   . Coronary artery disease   . Diabetes mellitus   . DJD (degenerative joint disease)   . Emphysema   . Hypercholesteremia   . Hypertension   . Myocardial infarction ([place])   . Obesity   . Obesity hypoventilation syndrome ([place])   . Renal insufficiency   . Scoliosis   . Sleep apnea   . Stroke ([employer])   . TIA (transient ischemic attack)     Past Surgical History:  Procedure Laterality Date  . bil uterscospy     08/18/16 Dr. Jeffie Pollock  . COLON SURGERY    . CORONARY ANGIOPLASTY WITH STENT PLACEMENT    . CYSTOSCOPY WITH STENT  PLACEMENT Bilateral 08/04/2016   Procedure: CYSTOSCOPY WITH STENT PLACEMENT bilateral bilateral retrograde fecal disimpaction;  Surgeon: Ardis Hughs, MD;  Location: [facility];  Service: Urology;  Laterality: Bilateral;  . CYSTOSCOPY/URETEROSCOPY/HOLMIUM LASER/STENT PLACEMENT Bilateral 08/18/2016   Procedure: BILATERAL URETEROSCOPY WITH HOLMIUM LASER AND STENTS;  Surgeon: Irine Seal, MD;  Location: [facility];  Service: Urology;  Laterality: Bilateral;  . IR  FLUORO GUIDE CV LINE LEFT  10/27/2018  . IR REMOVAL TUN CV CATH W/O FL  01/26/2019  . IR US GUIDE VASC ACCESS LEFT  10/27/2018    Social History:  reports that she has quit smoking. She has never used smokeless tobacco. She reports that she does not drink alcohol and does not use drugs.  Allergies:  Allergies  Allergen Reactions  . Penicillins Hives and Swelling    ++ tolerates ceftriaxone++  Has patient had a PCN reaction causing immediate rash, facial/tongue/throat swelling, SOB or lightheadedness with hypotension: yes Has patient had a PCN reaction causing severe rash involving mucus membranes or skin necrosis: no Has patient had a PCN reaction that required hospitalization no Has patient had a PCN reaction occurring within the last 10 years: no If all of the above answers are "NO", then may proceed with Cephalosporin use.     Prior to Admission medications   Medication Sig Start Date End Date Taking? Authorizing Provider  acetaminophen (TYLENOL) 500 MG tablet Take 500 mg by mouth every 6 (six) hours as needed for moderate pain.    [provider]  amLODipine (NORVASC) 10 MG tablet Take 1 tablet (10 mg total) by mouth daily. 12/16/18   Jeanmarie Hubert, MD  atorvastatin (LIPITOR) 20 MG tablet Take 1 tablet (20 mg total) by mouth daily. 12/19/18   Jeanmarie Hubert, MD  collagenase (SANTYL) ointment Apply 1 application topically daily. 06/06/18   Lorella Nimrod, MD  Diapers & Supplies MISC Please provide patient with adult diapers and pads 06/03/16   Ophelia Shoulder, MD  feeding supplement, ENSURE COMPLETE, (ENSURE COMPLETE) LIQD Take 237 mLs by mouth 2 (two) times daily between meals. 07/08/18   Lorella Nimrod, MD  Oxycodone HCl 10 MG TABS Take 1 tablet (10 mg total) by mouth 2 (two) times daily as needed. 02/16/19   Earlene Plater, MD  Vitamin D, Ergocalciferol, (DRISDOL) 50000 units CAPS capsule Take 1 capsule (50,000 Units total) by mouth every 7 (seven) days. 8 weeks. 03/06/17    Lorella Nimrod, MD     Blood pressure 139/86, pulse (!) 103, temperature (!) 101.3 F (38.5 C), temperature source Oral, resp. rate (!) 26, height 5\' 2"  (1.575 m), weight 60 kg, SpO2 100 %. Physical Exam:  General: frail cachectic AA female in bed with severe scoliosis, and contractures on the right side.  Failed attempt at placing and NG tube on my entrance.  She is alert and tries to answer questions. HEENT: head is normocephalic, atraumatic.  Sclera are noninjected.  PERRL.  Ears and nose without any masses or lesions, she just had an NG placement failure so there is some blood at the nose..  Mouth is pink and moist Heart: regular, rate, and rhythm.  Normal s1,s2. No obvious murmurs, gallops, or rubs noted.  Palpable radial and decreased pedal pulses bilaterally Lungs: CTAB, no wheezes, rhonchi, or rales noted.  Respiratory effort nonlabored Abd: soft, some distension, she has 3 surgical scars, RUQ, midline and ? C section, she doesn't seem to tender, no BS.  She can't tell me when she had her last BM.  She had an indwelling foley that was replaced and she has Liller cloudy urine in the tubing.  Marked anasarca on the CT MS:she has significant scoliosis, right upper extremity contracture, decreased pulse in both feet and +2 edema LLE. Skin: warm and dry with no masses, she has some stage 1 skin break down on her rectum,but looks pretty good, considering hx of decubitus and osteomyelitis.   Neuro: aphasia, cannot lift either leg, contracture right arm and hand, sensation bilaterally Psych: Pt is alert and tries to answer questions,   Results for orders placed or performed during the hospital encounter of 03/08/20 (from the past 48 hour(s))  Lactic acid, plasma     Status: Abnormal   Collection Time: 03/08/20 11:18 AM  Result Value Ref Range   Lactic Acid, Venous 3.5 (HH) 0.5 - 1.9 mmol/L    Comment: CRITICAL RESULT CALLED TO, READ BACK BY AND VERIFIED WITH: DR. Jeanell Sparrow @ 1240 03/08/20  LEONARD,A Performed at [facility], [address], [place], [place] [address]   Comprehensive metabolic panel     Status: Abnormal   Collection Time: 03/08/20 11:18 AM  Result Value Ref Range   Sodium 140 135 - 145 mmol/L   Potassium 4.6 3.5 - 5.1 mmol/L   Chloride 111 98 - 111 mmol/L   CO2 13 (L) 22 - 32 mmol/L   Glucose, Bld 158 (H) 70 - 99 mg/dL    Comment: Glucose reference range applies only to samples taken after fasting for at least 8 hours.   BUN 72 (H) 8 - 23 mg/dL   Creatinine, Ser 2.06 (H) 0.44 - 1.00 mg/dL   Calcium 8.9 8.9 - 10.3 mg/dL   Total Protein 7.0 6.5 - 8.1 g/dL   Albumin 3.6 3.5 - 5.0 g/dL   AST 17 15 - 41 U/L   ALT 11 0 - 44 U/L   Alkaline Phosphatase 61 38 - 126 U/L   Total Bilirubin 0.9 0.3 - 1.2 mg/dL   GFR, Estimated 26 (L) >60 mL/min    Comment: (NOTE) Calculated using the CKD-EPI Creatinine Equation (2021)    Anion gap 16 (H) 5 - 15    Comment: Performed at [facility], [place] [address] [place], [place] [address]  CBC WITH DIFFERENTIAL     Status: Abnormal   Collection Time: 03/08/20 11:18 AM  Result Value Ref Range   WBC 13.6 (H) 4.0 - 10.5 K/uL   RBC 4.08 3.87 - 5.11 MIL/uL   Hemoglobin 12.0 12.0 - 15.0 g/dL   HCT 39.8 36.0 - 46.0 %   MCV 97.5 80.0 - 100.0 fL   MCH 29.4 26.0 - 34.0 pg   MCHC 30.2 30.0 - 36.0 g/dL   RDW 17.0 (H) 11.5 - 15.5 %   Platelets 257 150 - 400 K/uL   nRBC 0.0 0.0 - 0.2 %   Neutrophils Relative % 85 %   Neutro Abs 11.6 (H) 1.7 - 7.7 K/uL   Lymphocytes Relative 10 %   Lymphs Abs 1.4 0.7 - 4.0 K/uL   Monocytes Relative 4 %   Monocytes Absolute 0.5 0.1 - 1.0 K/uL   Eosinophils Relative 0 %   Eosinophils Absolute 0.0 0.0 - 0.5 K/uL   Basophils Relative 0 %   Basophils Absolute 0.0 0.0 - 0.1 K/uL   Immature Granulocytes 1 %   Abs Immature Granulocytes 0.07 0.00 - 0.07 K/uL   Polychromasia PRESENT     Comment: Performed at [facility], [address], [place], [place] [address]  Protime-INR      Status: Abnormal   Collection Time: 03/08/20 11:18 AM  Result Value Ref Range   Prothrombin Time 15.4 (H) 11.4 - 15.2 seconds   INR 1.3 (H) 0.8 - 1.2    Comment: (NOTE) INR goal varies based on device and disease states. Performed at [facility], [place] [address] [place], [place] [address]   APTT     Status: None   Collection Time: 03/08/20 11:18 AM  Result Value Ref Range   aPTT 33 24 - 36 seconds    Comment: Performed at [place] [address] [place], [place] [address]  Lactic acid, plasma     Status: Abnormal   Collection Time: 03/08/20 12:39 PM  Result Value Ref Range   Lactic Acid, Venous 3.3 (HH) 0.5 - 1.9 mmol/L    Comment: CRITICAL VALUE NOTED.  VALUE IS CONSISTENT WITH PREVIOUSLY REPORTED AND CALLED VALUE. Performed at [facility], [place] [address], [place], [place] [address]   Urinalysis, Routine w reflex microscopic Urine, Catheterized     Status: Abnormal   Collection Time: 03/08/20  1:21 PM  Result Value Ref Range   Color, Urine AMBER (A) YELLOW    Comment: BIOCHEMICALS MAY BE AFFECTED BY COLOR   APPearance TURBID (A) CLEAR   Specific Gravity, Urine  1.005 - 1.030    TEST NOT REPORTED DUE TO COLOR INTERFERENCE OF URINE PIGMENT   pH  5.0 - 8.0    TEST NOT REPORTED DUE TO COLOR INTERFERENCE OF URINE PIGMENT   Glucose, UA (A) NEGATIVE mg/dL    TEST NOT REPORTED DUE TO COLOR INTERFERENCE OF URINE PIGMENT   Hgb urine dipstick (A) NEGATIVE    TEST NOT REPORTED DUE TO COLOR INTERFERENCE OF URINE PIGMENT   Bilirubin Urine (A) NEGATIVE    TEST NOT REPORTED DUE TO COLOR INTERFERENCE OF URINE PIGMENT   Ketones, ur (A) NEGATIVE mg/dL    TEST NOT REPORTED DUE TO COLOR INTERFERENCE OF URINE PIGMENT   Protein, ur (A) NEGATIVE mg/dL    TEST NOT REPORTED DUE TO COLOR INTERFERENCE OF URINE PIGMENT   Nitrite (A) NEGATIVE    TEST NOT REPORTED DUE TO COLOR INTERFERENCE OF URINE PIGMENT   Leukocytes,Ua (A) NEGATIVE    TEST NOT REPORTED DUE TO COLOR  INTERFERENCE OF URINE PIGMENT    Comment: Performed at [facility], [place] [address] [place], [place] [address]  Urinalysis, Microscopic (reflex)     Status: Abnormal   Collection Time: 03/08/20  1:21 PM  Result Value Ref Range   RBC / HPF 0-5 0 - 5 RBC/hpf   WBC, UA 0-5 0 - 5 WBC/hpf   Bacteria, UA MANY (A) NONE SEEN   Squamous Epithelial / LPF 0-5 0 - 5   Triple Phosphate Crystal PRESENT    Urine-Other MICROSCOPIC EXAM PERFORMED ON UNCONCENTRATED URINE     Comment: LESS THAN 10 mL OF URINE SUBMITTED Performed at [facility], [place] [address] [place], [place] [address]    CT ABDOMEN PELVIS WO CONTRAST  Result Date: 03/08/2020 CLINICAL DATA:  Acute, unspecified abdominal pain EXAM: CT ABDOMEN AND PELVIS WITHOUT CONTRAST TECHNIQUE: Multidetector CT imaging of the abdomen and pelvis was performed following the standard protocol without IV contrast. COMPARISON:  None. FINDINGS: Lower chest: Mild right basilar atelectasis. Extensive multi-vessel coronary artery calcification. Cardiac size within normal limits. Hepatobiliary: No focal liver abnormality is seen. Status  post cholecystectomy. No biliary dilatation. Pancreas: Unremarkable Spleen: Unremarkable Adrenals/Urinary Tract: 18 mm right adrenal adenoma. Left adrenal gland is unremarkable. Kidneys are unremarkable. Foley catheter balloon is seen within a decompressed bladder lumen. Stomach/Bowel: There is a high-grade distal small-bowel obstruction with the point of transition seen within the periumbilical region at axial image # 52 and coronal image # 53. There is inspissation of intraluminal contents secondary to stasis just proximal to the point of transition with fluid-filled dilated loops of small bowel seen proximal to that. The stomach is fluid-filled and mildly distended. Moderate stool within the rectal vault. The terminal small bowel and colon are otherwise unremarkable. Appendectomy has been performed. No free intraperitoneal  gas there is mild ascites present. Vascular/Lymphatic: Mild aortoiliac atherosclerotic calcification. No aortic aneurysm. No pathologic adenopathy within the abdomen and pelvis. Reproductive: Status post hysterectomy. No adnexal masses. Other: No abdominal wall hernia. Moderate diffuse subcutaneous body wall edema in keeping with anasarca. Musculoskeletal: Moderate to severe thoracolumbar scoliosis. Extensive heterotopic ossification surrounding the right hip. No acute bone abnormality within the abdomen and pelvis. IMPRESSION: Distal high-grade small bowel obstruction with single point of transition within the periumbilical region. The proximal small bowel and stomach are dilated and fluid-filled. Extensive multi-vessel coronary artery calcification. Moderate anasarca with mild ascites and diffuse body wall subcutaneous edema. 18 mm right adrenal adenoma Aortic Atherosclerosis (ICD10-I70.0). Electronically Signed   By: Fidela Salisbury MD   On: 03/08/2020 13:53   CT Head Wo Contrast  Result Date: 03/08/2020 CLINICAL DATA:  Delirium. EXAM: CT HEAD WITHOUT CONTRAST TECHNIQUE: Contiguous axial images were obtained from the base of the skull through the vertex without intravenous contrast. COMPARISON:  CT head August 04, 2016. FINDINGS: Brain: No evidence of acute large vascular territory infarct. Similar appearance prior left frontal infarct and prior left basal ganglia lacunar infarcts. Similar versus slightly progressed age-advanced patchy white matter hypoattenuation, likely related to chronic microvascular ischemic disease. Generalized cerebral atrophy. Bilateral basal ganglia calcifications. Vascular: Calcific atherosclerosis. No hyperdense vessel identified. Skull: No acute fracture. Sinuses/Orbits: Mild scattered paranasal sinus mucosal thickening. No air-fluid levels. Unremarkable orbits. Other: No mastoid effusions. Cerumen in bilateral external auditory canals. IMPRESSION: 1. No evidence of acute  intracranial abnormality. MRI could provide more sensitive evaluation for acute infarct if clinically indicated. 2. Remote left frontal and left basal ganglia infarcts. 3. Similar versus slightly progressed age-advanced chronic microvascular ischemic disease and cerebral atrophy. Electronically Signed   By: Margaretha Sheffield MD   On: 03/08/2020 12:16   DG Chest Port 1 View  Result Date: 03/08/2020 CLINICAL DATA:  70 year old female with questionable sepsis EXAM: PORTABLE CHEST 1 VIEW COMPARISON:  08/17/2017 FINDINGS: Cardiomediastinal silhouette unchanged with right rotation somewhat limiting the evaluation. Tortuosity of the aorta. No pneumothorax. No pleural effusion. No interlobular septal thickening. No confluent airspace disease. No displaced fracture IMPRESSION: No definite evidence of acute cardiopulmonary disease. Electronically Signed   By: Corrie Mckusick D.O.   On: 03/08/2020 11:05   Anti-infectives (From admission, onward)   Start     Dose/Rate Route Frequency Ordered Stop   03/10/20 1200  vancomycin (VANCOREADY) IVPB 1000 mg/200 mL        1,000 mg 200 mL/hr over 60 Minutes Intravenous Every 48 hours 03/08/20 1317     03/09/20 1300  ceFEPIme (MAXIPIME) 2 g in sodium chloride 0.9 % 100 mL IVPB        2 g 200 mL/hr over 30 Minutes Intravenous Every 24 hours 03/08/20 1312  03/08/20 1315  vancomycin (VANCOREADY) IVPB 1250 mg/250 mL        1,250 mg 166.7 mL/hr over 90 Minutes Intravenous  Once 03/08/20 1249 03/08/20 1548   03/08/20 1300  aztreonam (AZACTAM) 2 g in sodium chloride 0.9 % 100 mL IVPB  Status:  Discontinued        2 g 200 mL/hr over 30 Minutes Intravenous  Once 03/08/20 1249 03/08/20 1259   03/08/20 1300  metroNIDAZOLE (FLAGYL) IVPB 500 mg        500 mg 100 mL/hr over 60 Minutes Intravenous  Once 03/08/20 1249 03/08/20 1554   03/08/20 1300  ceFEPIme (MAXIPIME) 2 g in sodium chloride 0.9 % 100 mL IVPB        2 g 200 mL/hr over 30 Minutes Intravenous  Once 03/08/20 1259  03/08/20 1411       Assessment/Plan Type 2 diabetes CAD Hx CHF COPD Obstructive sleep apnea with CPAP Hx CVA with hemiparesis, aphasia and dysphagia Stage IV CKD Hx chronic pain/long-term opiate use (oxycodone 10 mg; BID) Hx chronic constipation (02/14/16 Homebound Anemia Hx sacral wound decubitus with osteomyelitis -ID Dr. Karolee Ohs following IV antibiotics completed 01/18/2019 COVID + 02/27/20  Sepsis ? UTI SBO  FEN:  IV fluids/NPO ID:  Maxipime, Flagyl, vancomycin 2/11>> DVT: Lovenox   Plan:  I have seen the patient and we are going to replace the NG and give her some time on suction once the NG is in.  I have reordered the small bowel protocol to start at 2000 hrs tonight.  I will defer to Medicine on her IV fluids, once the NG is working she can have some ice chips for oral comfort.  She should have an AM film early tomorrow morning.  He last visit with abdominal pain in 2018 she had issues with chronic constipation.  She is on chronic oxycodone, chronic indwelling foley with probable UTI, and now bed bound, with a large amount of stool in her small bowel and colon.  Hopefully this is the combination of factors are the source of her abdominal issue, and not an actual obstruction.  I can't really find out what she had colon surgery for in the past.    We will follow with you.    Earnstine [facility] Surgery 03/08/2020, 2:46 PM Please see Amion for pager number during day hours 7:00am-4:30pm

## 2020-03-08 NOTE — H&P (Addendum)
Date: 03/08/2020               Patient Name:  Michelle Holmes MRN: [id number]  DOB: 09-24-1950 Age / Sex: 70 y.o., female   PCP: Gaylan Gerold, DO         Medical Service: Internal Medicine Teaching Service         Attending Physician: Dr. Rebeca Alert Raynaldo Opitz, MD    First Contact: Dr. Gaylan Gerold Pager: [phone]  Second Contact: Dr. Harvie Heck Pager: [phone]       After Hours (After 5p/  First Contact Pager: [phone]  weekends / holidays): Second Contact Pager: [phone]   Chief Complaint: Sepsis  History of Present Illness:   Michelle Holmes is a 70 year old female with PMHx of via anemia, CHF, COPD, CAD, CVA with residual right hemiparesis, decubitus ulcer, chronic indwelling catheter, history of sacral osteomyelitis and recent Covid infection who presenting from home for altered mental status. Patient evaluated at bedside. She is resting comfortably in bed. She is oriented to self and location but unable to tell the year, current president or situation. Patient endorses that she stays by herself.  She does endorse some back pain but remainder of symptoms, including coughing, sore throat, shortness of breath, chest pain,  headache, fevers/chills, nausea/vomiting, diarrhea, abdominal pain, dysuria or rash. Denies any pain at this time.   Family conversation was facilitated with Dr. Jeanell Holmes, ED provider.  Patient's daughter, Michelle Holmes and patient sister with their with another daughter on the phone.  At this time, family would like full code with full treatment plan.  Patient's daughter states that patient was in her usual state of health yesterday which she was talking and joking with the nurse.  Today patient seems to be slower and less talkative and also found to have facial drooping, that prompted her to call EMS.  Patient states that patient was recently diagnosed with UTI and was treated 2 weeks of clindamycin, her last dose was 3 days ago.  Patient has chronic diarrhea, especially with taking  antibiotics.  Had about 3 bowel movements a day.  In the ED, lactic acid 3.5.  Code sepsis was called due to elevated lactic acid, fever to 1.3, tachycardic and tachypneic.  WBC elevated at 13.6 with normal hemoglobin.  CMP showed normal sodium, potassium and creatinine of 2.06, appears at baseline.  There is also anion gap metabolic acidosis 2/2 lactic acidosis.  Patient received fluid per sepsis protocol and antibiotics with vancomycin, cefepime and metronidazole.   Meds:  No outpatient medications have been marked as taking for the 03/08/20 encounter [facility] Encounter).     Allergies: Allergies as of 03/08/2020 - Review Complete 03/08/2020  Allergen Reaction Noted  . Penicillins Hives and Swelling 12/01/2010   Past Medical History:  Diagnosis Date  . Anemia   . CHF (congestive heart failure) ([place])   . Chronic back pain   . COPD (chronic obstructive pulmonary disease) ([place])   . Coronary artery disease   . Diabetes mellitus   . DJD (degenerative joint disease)   . Emphysema   . Hypercholesteremia   . Hypertension   . Myocardial infarction ([place])   . Obesity   . Obesity hypoventilation syndrome ([place])   . Renal insufficiency   . Scoliosis   . Sleep apnea   . Stroke ([place])   . TIA (transient ischemic attack)     Family History:  Family History  Problem Relation Age of Onset  . Diabetes  Mother   . Stroke Maternal Aunt     Social History:  Lives with daughter.  Bedbound  Review of Systems: A complete ROS was negative except as per HPI.   Physical Exam: Blood pressure 139/86, pulse (!) 103, temperature (!) 101.3 F (38.5 C), temperature source Oral, resp. rate (!) 26, height 5\' 2"  (1.575 m), weight 60 kg, SpO2 100 %.  Physical Exam Constitutional:      General: She is not in acute distress.    Appearance: She is ill-appearing. She is not toxic-appearing.     Comments: Patient is alert and oriented to person and place.  She appears tired.  Patient is able to answer  questions appropriately and follow commands.  HENT:     Head: Normocephalic.     Mouth/Throat:     Mouth: Mucous membranes are dry.  Eyes:     General:        Right eye: No discharge.        Left eye: No discharge.  Cardiovascular:     Rate and Rhythm: Regular rhythm. Tachycardia present.  Pulmonary:     Effort: Pulmonary effort is normal. No respiratory distress.     Comments: Satting well on room air Abdominal:     General: There is no distension.     Tenderness: There is no abdominal tenderness. There is no guarding.  Musculoskeletal:     Right lower leg: No edema.     Left lower leg: No edema.     Comments: Bilateral lower extremities cool to touch.  Dorsalis pedis pulse palpated bilaterally.  Skin:    Comments: Sacral wounds noted on exam.  Appears in good healing stage, no open ulcers noted. No open wounds of bilateral lower extremities.  Neurological:     Mental Status: She is alert.     Comments: Contracture of right arm and right leg.  Able to move left arm.     EKG: personally reviewed my interpretation is sinus tachycardia  CXR: personally reviewed my interpretation is unremarkable  CT abdomen pelvis: Distal high-grade small bowel obstruction with single point of transition within the periumbilical region. The proximal small bowel and stomach are dilated and fluid-filled.  Extensive multi-vessel coronary artery calcification.  Moderate anasarca with mild ascites and diffuse body wall subcutaneous edema.  18 mm right adrenal adenoma   Assessment & Plan by Problem: Dhanvi Boesen is a 70 year old female with PMHx of via anemia, CHF, COPD, CAD, CVA with residual right hemiparesis, decubitus ulcer, chronic indwelling catheter, history of sacral osteomyelitis and recent Covid infection who presenting from home for altered mental status, found to have sepsis and small bowel obstruction.  Active Problems:   Sepsis ([place])   Small bowel obstruction ([facility])  Altered  mental status Sepsis  Small bowel obstruction -Patient presented to the ED for change in mental status. Code sepsis was initiated on admission with elevated lactic acid, fever, leukocytosis, tachycardia and tachypnea.   -There are multiple causes that can contribute to her altered mental status.  It can be sepsis secondary UTI given her chronic indwelling Foley catheter.  UA also showed many bacteria.  Patient is on Vanc, Cefepime and Flagyl. She also received fluid per sepsis protocol.  Her tachycardia improved with fluid resuscitation.  Blood pressure stable -She also found to have SBO on CT abdomen pelvis with stool burden.  Surgery was consulted.  NG tube suction per small bowel obstruction protocol.  Unclear cause of her SBO, could be adhesion  from past colon surgery but unsure date.  Patient is currently ill, bedbound with stool burden, which could contribute to her problem.  Enema ordered per surgery. -Family reports facial drooping.  CT head showed no evidence of acute intracranial abnormality.  MRI no acute infarct with new left occipital infarct since 2018, which does not explain her symptoms. -Continue vancomycin, cefepime and Flagyl -Pending repeat UA -Pending blood culture -Trend lactic acid -Fluid resuscitation per sepsis protocol -NG tube suction -Smog enema -N.p.o. -Appreciate surgery recommendation -Continue goal of care discussion with family   CKD Serum creatinine at baseline -Monitor BMP   Hypertension -Amlodipine in the setting of sepsis   Hyperlipidemia -Holding atorvastatin due to SBO   Adrenal adenoma Found on CT. can follow-up outpatient   CODE STATUS: Full  DVT: Lovenox IV fluid: LR Diet: N.p.o.   Dispo: Admit patient to Inpatient with expected length of stay greater than 2 midnights.  Signed: Gaylan Gerold, DO 03/08/2020, 3:06 PM  Pager: @MYPAGER @ After 5pm on weekdays and 1pm on weekends: On Call pager: [phone]

## 2020-03-08 NOTE — Progress Notes (Incomplete)
Patient seen and examined.

## 2020-03-08 NOTE — ED Notes (Addendum)
Notified Dr. Alfonse Spruce of pt's BP's 91/72 and tachycardic at 110. No new orders at this time.

## 2020-03-08 NOTE — Progress Notes (Signed)
Pharmacy Antibiotic Note  Michelle Holmes is a 70 y.o. female admitted on 03/08/2020 with sepsis.  Pharmacy has been consulted for Vancomycin and Cefepime dosing. WBC elevated 13.6, LA 3.5, febrile with Tm 101.3. Scr 2.06 (recent baseline unknown), CrCl 20.4 ml/min.  Patient has PCN allergy documented, but she has tolerated multiple cephalosporins on multiple on occasions in the past and should tolerate cefepime fine.  Plan: Vancomycin 1250 mg IV once, then 1000 mg IV Q48hrs Cefepime 2gm IV Q24 hrs Monitor renal function, cultures/sensitivities, clinical progression. Vancomycin levels at steady state.  Height: 5\' 2"  (157.5 cm) Weight: 60 kg (132 lb 4.4 oz) IBW/kg (Calculated) : 50.1  Temp (24hrs), Avg:101.3 F (38.5 C), Min:101.3 F (38.5 C), Max:101.3 F (38.5 C)  Recent Labs  Lab 03/08/20 1118  WBC 13.6*  CREATININE 2.06*  LATICACIDVEN 3.5*    Estimated Creatinine Clearance: 20.4 mL/min (A) (by C-G formula based on SCr of 2.06 mg/dL (H)).    Allergies  Allergen Reactions  . Penicillins Hives and Swelling    ++ tolerates ceftriaxone++  Has patient had a PCN reaction causing immediate rash, facial/tongue/throat swelling, SOB or lightheadedness with hypotension: yes Has patient had a PCN reaction causing severe rash involving mucus membranes or skin necrosis: no Has patient had a PCN reaction that required hospitalization no Has patient had a PCN reaction occurring within the last 10 years: no If all of the above answers are "NO", then may proceed with Cephalosporin use.     Antimicrobials this admission: Vancomycin 2/11 >>  Cefepime 2/11 >>  Flagyl 2/11 >>  Dose adjustments this admission: N/A  Microbiology results: 2/11 BCx: pend 2/11 UCx: pend   Richardine Service, PharmD, BCPS PGY2 Cardiology Pharmacy Resident Phone: [phone] 03/08/2020  1:18 PM  Please check [email or web address] for unit-specific pharmacy phone numbers.

## 2020-03-08 NOTE — Progress Notes (Signed)
[place] following sepsis protocol

## 2020-03-09 ENCOUNTER — Inpatient Hospital Stay (HOSPITAL_COMMUNITY): Payer: Medicare Other

## 2020-03-09 DIAGNOSIS — N184 Chronic kidney disease, stage 4 (severe): Secondary | ICD-10-CM | POA: Diagnosis not present

## 2020-03-09 DIAGNOSIS — G9340 Encephalopathy, unspecified: Secondary | ICD-10-CM

## 2020-03-09 DIAGNOSIS — K56609 Unspecified intestinal obstruction, unspecified as to partial versus complete obstruction: Secondary | ICD-10-CM

## 2020-03-09 DIAGNOSIS — Z515 Encounter for palliative care: Secondary | ICD-10-CM

## 2020-03-09 DIAGNOSIS — I69359 Hemiplegia and hemiparesis following cerebral infarction affecting unspecified side: Secondary | ICD-10-CM

## 2020-03-09 DIAGNOSIS — A419 Sepsis, unspecified organism: Secondary | ICD-10-CM | POA: Diagnosis not present

## 2020-03-09 DIAGNOSIS — Z7189 Other specified counseling: Secondary | ICD-10-CM

## 2020-03-09 DIAGNOSIS — I69391 Dysphagia following cerebral infarction: Secondary | ICD-10-CM | POA: Diagnosis not present

## 2020-03-09 DIAGNOSIS — Z66 Do not resuscitate: Secondary | ICD-10-CM

## 2020-03-09 DIAGNOSIS — R6520 Severe sepsis without septic shock: Secondary | ICD-10-CM

## 2020-03-09 DIAGNOSIS — I69320 Aphasia following cerebral infarction: Secondary | ICD-10-CM

## 2020-03-09 DIAGNOSIS — R5381 Other malaise: Secondary | ICD-10-CM

## 2020-03-09 LAB — COMPREHENSIVE METABOLIC PANEL
ALT: 9 U/L (ref 0–44)
AST: 24 U/L (ref 15–41)
Albumin: 2.7 g/dL — ABNORMAL LOW (ref 3.5–5.0)
Alkaline Phosphatase: 51 U/L (ref 38–126)
Anion gap: 16 — ABNORMAL HIGH (ref 5–15)
BUN: 75 mg/dL — ABNORMAL HIGH (ref 8–23)
CO2: 11 mmol/L — ABNORMAL LOW (ref 22–32)
Calcium: 8.5 mg/dL — ABNORMAL LOW (ref 8.9–10.3)
Chloride: 113 mmol/L — ABNORMAL HIGH (ref 98–111)
Creatinine, Ser: 2.07 mg/dL — ABNORMAL HIGH (ref 0.44–1.00)
GFR, Estimated: 25 mL/min — ABNORMAL LOW (ref >60)
Glucose, Bld: 140 mg/dL — ABNORMAL HIGH (ref 70–99)
Potassium: 4.5 mmol/L (ref 3.5–5.1)
Sodium: 140 mmol/L (ref 135–145)
Total Bilirubin: 0.7 mg/dL (ref 0.3–1.2)
Total Protein: 5.8 g/dL — ABNORMAL LOW (ref 6.5–8.1)

## 2020-03-09 LAB — CBC
HCT: 33.7 % — ABNORMAL LOW (ref 36.0–46.0)
Hemoglobin: 10.5 g/dL — ABNORMAL LOW (ref 12.0–15.0)
MCH: 30.2 pg (ref 26.0–34.0)
MCHC: 31.2 g/dL (ref 30.0–36.0)
MCV: 96.8 fL (ref 80.0–100.0)
Platelets: 162 10*3/uL (ref 150–400)
RBC: 3.48 MIL/uL — ABNORMAL LOW (ref 3.87–5.11)
RDW: 17.1 % — ABNORMAL HIGH (ref 11.5–15.5)
WBC: 15.6 10*3/uL — ABNORMAL HIGH (ref 4.0–10.5)
nRBC: 0.1 % (ref 0.0–0.2)

## 2020-03-09 LAB — LACTIC ACID, PLASMA
Lactic Acid, Venous: 2.4 mmol/L (ref 0.5–1.9)
Lactic Acid, Venous: 2.7 mmol/L (ref 0.5–1.9)
Lactic Acid, Venous: 2.6 mmol/L (ref 0.5–1.9)

## 2020-03-09 LAB — PROTIME-INR
INR: 2 — ABNORMAL HIGH (ref 0.8–1.2)
Prothrombin Time: 21.8 seconds — ABNORMAL HIGH (ref 11.4–15.2)

## 2020-03-09 LAB — HIV ANTIBODY (ROUTINE TESTING W REFLEX): HIV Screen 4th Generation wRfx: NONREACTIVE

## 2020-03-09 MED ORDER — CHLORHEXIDINE GLUCONATE CLOTH 2 % EX PADS
6.0000 | MEDICATED_PAD | Freq: Every day | CUTANEOUS | Status: DC
  Administered 2020-03-09 – 2020-03-13 (×5): 6 via TOPICAL

## 2020-03-09 MED ORDER — LACTATED RINGERS IV BOLUS
500.0000 mL | Freq: Once | INTRAVENOUS | Status: AC
  Administered 2020-03-09: 500 mL via INTRAVENOUS

## 2020-03-09 MED ORDER — LACTATED RINGERS IV BOLUS
1000.0000 mL | Freq: Once | INTRAVENOUS | Status: AC
  Administered 2020-03-09: 1000 mL via INTRAVENOUS

## 2020-03-09 MED ORDER — ORAL CARE MOUTH RINSE
15.0000 mL | Freq: Two times a day (BID) | OROMUCOSAL | Status: DC
  Administered 2020-03-09 – 2020-03-14 (×10): 15 mL via OROMUCOSAL

## 2020-03-09 MED ORDER — LACTATED RINGERS IV SOLN: INTRAVENOUS | Status: AC

## 2020-03-09 MED ORDER — HYDROMORPHONE HCL 1 MG/ML IJ SOLN
0.5000 mg | INTRAMUSCULAR | Status: DC | PRN
  Administered 2020-03-09 – 2020-03-13 (×8): 0.5 mg via INTRAVENOUS
  Filled 2020-03-09 (×4): qty 0.5
  Filled 2020-03-09: qty 1
  Filled 2020-03-09 (×3): qty 0.5

## 2020-03-09 NOTE — Progress Notes (Addendum)
Subjective:   Hospital day:1  Overnight event:None  [name] evaluated at bedside this morning.  Patient's mental status changed compared to yesterday.  She is more confused and unable to verbalize at this time. Not able to indicate where she is hurting. Not able to tell her name.  Patient does not appear in acute distress.  ~100 cc of Giorgio bilious drainage  I called and spoke to patient's daughter, Michelle Holmes, about patient's current medical conditions.  I explained to her that patient is sick with multiple complicated medical issues.  Patient's daughter requests a family meeting to discuss goal of care.  She gave permission for a palliative consult, who can help facilitate this family meeting.   Objective:  Vital signs in last 24 hours: Vitals:   03/09/20 0200 03/09/20 0245 03/09/20 0250 03/09/20 0615  BP: 107/71 107/70  93/63  Pulse: 96 (!) 106  98  Resp: (!) 22 (!) 31  (!) 21  Temp:   98.5 F (36.9 C) 99.8 F (37.7 C)  TempSrc:   Oral Oral  SpO2: 95% 100%  98%  Weight:      Height:       CBC Latest Ref Rng & Units 03/09/2020 03/08/2020 12/16/2018  WBC 4.0 - 10.5 K/uL 15.6(H) 13.6(H) 9.2  Hemoglobin 12.0 - 15.0 g/dL 10.5(L) 12.0 9.1(L)  Hematocrit 36.0 - 46.0 % 33.7(L) 39.8 27.3(L)  Platelets 150 - 400 K/uL 162 257 287   CMP Latest Ref Rng & Units 03/09/2020 03/08/2020 12/16/2018  Glucose 70 - 99 mg/dL 140(H) 158(H) 126(H)  BUN 8 - 23 mg/dL 75(H) 72(H) 65(H)  Creatinine 0.44 - 1.00 mg/dL 2.07(H) 2.06(H) 4.13(H)  Sodium 135 - 145 mmol/L 140 140 142  Potassium 3.5 - 5.1 mmol/L 4.5 4.6 5.2  Chloride 98 - 111 mmol/L 113(H) 111 106  CO2 22 - 32 mmol/L 11(L) 13(L) 17(L)  Calcium 8.9 - 10.3 mg/dL 8.5(L) 8.9 9.7  Total Protein 6.5 - 8.1 g/dL 5.8(L) 7.0 -  Total Bilirubin 0.3 - 1.2 mg/dL 0.7 0.9 -  Alkaline Phos 38 - 126 U/L 51 61 -  AST 15 - 41 U/L 24 17 -  ALT 0 - 44 U/L 9 11 -     Physical Exam  Physical Exam Constitutional:      General: She is not in acute  distress.    Appearance: She is ill-appearing.  HENT:     Head: Normocephalic.  Eyes:     General:        Right eye: No discharge.        Left eye: No discharge.  Cardiovascular:     Rate and Rhythm: Regular rhythm. Tachycardia present.  Pulmonary:     Effort: No respiratory distress.  Abdominal:     General: There is no distension.     Tenderness: There is abdominal tenderness (diffuse tenderness to palpation ).     Hernia: No hernia is present.     Comments: No bowel sound    Musculoskeletal:     Right lower leg: No edema (trace).     Left lower leg: No edema (trace).     Assessment/Plan: Michelle Holmes is a 70 year old female with PMHx of via anemia, CHF, COPD, CAD, CVA with residual right hemiparesis, decubitus ulcer, chronic indwelling catheter, history of sacral osteomyelitis and recent Covid infection who presenting from home for altered mental status, found to have sepsis and small bowel obstruction.  Active Problems:   Sepsis ([place])   Small  bowel obstruction ([facility])   Altered mental status Sepsis  Sepsis likely secondary UTI given her chronic indwelling Foley catheter.  Initial UA showed many bacteria.  Repeat UA shows pyuria.  Urine culture grew > 100K colonies of gram-negative rods, awaiting speciation.  Patient is on vancomycin and cefepime for broad-spectrum.  Pending blood culture and final results of urine culture. Blood pressure stable with fluid resuscitation.  Still tachycardic.  Lactic acid initially trending down from 3.3 to 2.4 but trending up to 2.7. Patient received 1 L bolus of LR today.  Continue maintenance fluid and trend lactic acid Her mental status worsened today. Patient has multiple complicated medical issues.  Updated family.  Palliative consult for assistance with goal of care discussion. -Continue cefepime, will stop vancomycin -Pending urine culture -Pending blood culture -Trend lactic acid -Appreciate palliative team recommendation    Small  Bowel Obstruction   NG tube suction per small bowel obstruction protocol.  Patient had a bowel movement with enema yesterday, hopefully it will help with her SBO.  Patient is tender with abdominal palpation today. -Continue NG tube decompression -N.p.o. -Appreciate surgery recommendation   CKD Serum creatinine at baseline -Monitor BMP     Hypertension -Holding amlodipine in the setting of sepsis     Hyperlipidemia -Holding atorvastatin due to SBO     Adrenal adenoma Found on CT. can follow-up outpatient     CODE STATUS: Full  DVT: Lovenox IV fluid: LR Diet: N.p.o.    Prior to Admission Living Arrangement: Home Anticipated Discharge Location: To be determined Barriers to Discharge: Continue medical work-up  Gaylan Gerold, DO 03/09/2020, 7:07 AM Pager: [phone] After 5pm on weekdays and 1pm on weekends: On Call pager [phone]

## 2020-03-09 NOTE — ED Notes (Signed)
Pt cleaned of bowel incontinence from enema. Perineal care provided.

## 2020-03-09 NOTE — ED Notes (Signed)
Enema given to patient. Moderate results. Soft brown stool present. Pt cleaned of stool. Pt repositioned.

## 2020-03-09 NOTE — Plan of Care (Signed)

## 2020-03-09 NOTE — Consult Note (Signed)
Consultation Note Date: 03/09/2020   Patient Name: Michelle Holmes  DOB: [birth year]  MRN: [id number]  Age / Sex: 70 y.o., female  PCP: Gaylan Gerold, DO Referring Physician: Oda Kilts, MD  Reason for Consultation: Establishing goals of care  HPI/Patient Profile: 70 y.o. female  with past medical history of CVA with residual right hemiparesis, chronic indwelling catheter, decubitus ulcer, CKD stage IV-V, CHF, COPD, CAD, anemia, osteomyelitis, and recent Covid infection. She presented to the emergency department on 03/08/2020 with altered mental status.  ED Course: CT scan showed distal high grade small bowel obstruction, moderate anasarca with mild ascites, and right adrenal adenoma. Lactic acid 3.5, trending down. WBC 13.6, urinalysis with many bacteria. Creatinine 2.06, at baseline. Patient admitted to Internal Medicine TS with sepsis likely secondary to UTI and small bowel obstruction.   Clinical Assessment and Goals of Care: I have reviewed medical records including EPIC notes, labs and imaging, examined the patient and met at bedside with daughter Michelle Holmes)  to discuss diagnosis, prognosis, GOC, EOL wishes, disposition, and options.  I introduced Palliative Medicine as specialized medical care for people living with serious illness. It focuses on providing relief from the symptoms and stress of a serious illness. Discussed that PMT saw patient during her hospitalization in 2018.   We discussed a brief life review of the patient. She has been married 3 times, and is still legally married to her third husband (Michelle Holmes's father) who lives in [place]. Patient has a very large family. She has 5 biological children (all daughters): Annalee Genta, Joelene Millin, Nevin Bloodgood, and Barker [place]. She has 34 grandchildren and 12 great grandchildren. She also is the oldest of 9 siblings, all living.   As far as functional  status, patient has been bedbound and dependent for ADLs since her stroke in 2018. She has lived with [name], who has been her full time caregiver. At baseline, patient is interactive, enjoys watching TV, and can feed herself.   We discussed her current illness and what it means in the larger context of her ongoing co-morbidities.  Natural disease trajectory of chronic illness and debility was discussed. [name] has an excellent understanding of her mother's current medical condition and overall poor prognosis. She is having a difficult time however conveying this to the rest of her family. She calls several family members on speaker phone and has them listen in to our discussion.   I reviewed the patient's medical history and current condition with the family, emphasizing that she has multiple issues at baseline and has endured a lot over the past few years. Discussed that the UTI and SBO were potentially reversible, but that patient was unlikely to return to her previous level of functioning which was poor at baseline.   The difference between supportive medical intervention and comfort care was discussed. I introduced the concept of a comfort path to [place] and family and encouraged them to think about at what point they would want to transition the focus on comfort and dignity rather than prolonging  life. I expressed concern that patient is suffering [name] agrees) and discussed that comfort care allows [place] to provide more effective pain and symptom management for patient.   Patient has a MOST form on file from January 2021. I reviewed this with [name], confirming that it was still consistent with patient/family preferences for treatment. [name] confirms code status as DNR/DNI.   Discussed concept of watchful waiting to give patient a chance to improve with current supportive treatment. [name] indicates she agrees with transition to comfort care in event patient further declines or is not improving. She  also expresses she would want her mother to be home with hospice rather than at a hospice house.   Questions and concerns were addressed.  The family was encouraged to call with questions or concerns.   Primary decision maker: Daughter Michelle Reef (aka [name]) [place] is the patient's legal HCPOA. This document is on file in [place].     SUMMARY OF RECOMMENDATIONS    Code status changed to DNR/DNI  Continue current medical care  watchful waiting for 24-48 hours to see if patient improves  Plan for transition to comfort care if patient further declines or does not improve  PMT will continue to follow  Code Status/Advance Care Planning:  DNR  Symptom Management:   Per primary team  Palliative Prophylaxis:   Oral Care and Turn Reposition  Additional Recommendations (Limitations, Scope, Preferences):  Full Scope Treatment  Psycho-social/Spiritual:   Created space and opportunity for family to express thoughts and feelings regarding patient's current medical situation.   Emotional support provided   Prognosis:   Poor overall  Discharge Planning: To Be Determined      Primary Diagnoses: Present on Admission: . Sepsis ([place]) . Small bowel obstruction ([place]) . Chronic kidney disease (CKD), stage IV (severe) ([place])   I have reviewed the medical record, interviewed the patient and family, and examined the patient. The following aspects are pertinent.  Past Medical History:  Diagnosis Date  . Anemia   . CHF (congestive heart failure) ([place])   . Chronic back pain   . COPD (chronic obstructive pulmonary disease) ([place])   . Coronary artery disease   . Diabetes mellitus   . DJD (degenerative joint disease)   . Emphysema   . Hypercholesteremia   . Hypertension   . Myocardial infarction ([place])   . Obesity   . Obesity hypoventilation syndrome ([place])   . Renal insufficiency   . Scoliosis   . Sleep apnea   . Stroke ([place])   . TIA (transient ischemic attack)     Family History   Problem Relation Age of Onset  . Diabetes Mother   . Stroke Maternal Aunt    Scheduled Meds: . Chlorhexidine Gluconate Cloth  6 each Topical Daily  . enoxaparin (LOVENOX) injection  30 mg Subcutaneous Q24H  . mouth rinse  15 mL Mouth Rinse BID  . sodium chloride flush  3 mL Intravenous Q12H   Continuous Infusions: . ceFEPime (MAXIPIME) IV Stopped (03/09/20 1247)  . lactated ringers 100 mL/hr at 03/09/20 1208   PRN Meds:.HYDROmorphone (DILAUDID) injection Medications Prior to Admission:  Prior to Admission medications   Medication Sig Start Date End Date Taking? Authorizing Provider  amLODipine (NORVASC) 10 MG tablet Take 1 tablet (10 mg total) by mouth daily. 12/16/18  Yes Jeanmarie Hubert, MD  ascorbic acid (VITAMIN C) 1000 MG tablet Take 1,000 mg by mouth daily.   Yes [provider]  atorvastatin (LIPITOR) 20 MG tablet Take 1 tablet (  20 mg total) by mouth daily. 12/19/18  Yes Jeanmarie Hubert, MD  cetirizine (ZYRTEC) 10 MG tablet Take 10 mg by mouth daily. 11/18/19  Yes [provider]  Cholecalciferol 25 MCG (1000 UT) capsule Take 1,000 Units by mouth daily.   Yes [provider]  famotidine (PEPCID) 20 MG tablet Take 20 mg by mouth daily. 02/29/20  Yes [provider]  feeding supplement, ENSURE COMPLETE, (ENSURE COMPLETE) LIQD Take 237 mLs by mouth 2 (two) times daily between meals. 07/08/18  Yes Lorella Nimrod, MD  ondansetron (ZOFRAN) 4 MG tablet Take 4 mg by mouth every 8 (eight) hours as needed for nausea or vomiting.   Yes [provider]  Oxycodone HCl 10 MG TABS Take 1 tablet (10 mg total) by mouth 2 (two) times daily as needed. Patient taking differently: Take 10 mg by mouth 2 (two) times daily as needed (pain). 02/16/19  Yes Earlene Plater, MD  Vitamin D, Ergocalciferol, (DRISDOL) 50000 units CAPS capsule Take 1 capsule (50,000 Units total) by mouth every 7 (seven) days. 8 weeks. Patient taking differently: Take 50,000 Units by  mouth every 7 (seven) days. 03/06/17  Yes Lorella Nimrod, MD  zinc gluconate 50 MG tablet Take 50 mg by mouth daily.   Yes [provider]  acetaminophen (TYLENOL) 500 MG tablet Take 500 mg by mouth every 6 (six) hours as needed for moderate pain. Patient not taking: Reported on 03/08/2020    [provider]  collagenase (SANTYL) ointment Apply 1 application topically daily. Patient not taking: No sig reported 06/06/18   Lorella Nimrod, MD  Diapers & Supplies MISC Please provide patient with adult diapers and pads 06/03/16   Ophelia Shoulder, MD   Allergies  Allergen Reactions  . Penicillins Hives and Swelling    ++ tolerates ceftriaxone++  Has patient had a PCN reaction causing immediate rash, facial/tongue/throat swelling, SOB or lightheadedness with hypotension: yes Has patient had a PCN reaction causing severe rash involving mucus membranes or skin necrosis: no Has patient had a PCN reaction that required hospitalization no Has patient had a PCN reaction occurring within the last 10 years: no If all of the above answers are "NO", then may proceed with Cephalosporin use.   . Sulfa Antibiotics Other (See Comments)    Breaks out   Review of Systems  Unable to perform ROS: Patient nonverbal    Physical Exam Vitals reviewed.  Constitutional:      Appearance: She is ill-appearing.     Comments: Moaning, grimacing Frail  HENT:     Head:     Comments: NGT to wall suction Cardiovascular:     Rate and Rhythm: Normal rate and regular rhythm.  Pulmonary:     Effort: Pulmonary effort is normal.  Neurological:     Comments: Not verbally responsive     Vital Signs: BP (!) 109/92 (BP Location: Left Arm)   Pulse 95   Temp 98.5 F (36.9 C) (Oral)   Resp (!) 23   Ht 5' 2"  (1.575 m)   Wt 60 kg   SpO2 100%   BMI 24.19 kg/m  Pain Scale: Faces   Pain Score: 0-No pain   SpO2: SpO2: 100 % O2 Device:SpO2: 100 % O2 Flow Rate: .   IO: Intake/output summary:    Intake/Output Summary (Last 24 hours) at 03/09/2020 1716 Last data filed at 03/09/2020 1425 Gross per 24 hour  Intake 4250.09 ml  Output 1295 ml  Net 2955.09 ml    LBM: Last  BM Date: 03/09/20 Baseline Weight: Weight: 60 kg Most recent weight: Weight: 60 kg      Palliative Assessment/Data: PPS 10%     Time In: 18:00 Time Out: 19:42 Time Total: 102 minutes Greater than 50%  of this time was spent counseling and coordinating care related to the above assessment and plan.  Signed by: Lavena Bullion, NP   Please contact Palliative Medicine Team phone at [phone] for questions and concerns.  For individual provider: See Shea Evans

## 2020-03-09 NOTE — Progress Notes (Signed)
    CC:   Lethargic, bedbound secondary to stroke, on antibiotics, altered mental status  Subjective: Patient seems more lethargic this a.m. she did not try to talk this AM.  Her NG is in place.  The some was occluded I flushed it and flush the main NG and its draining better now.  I placed a new filter on the NG sump.  Once recorded she did have a bowel movement with the enema.  Small bowel protocol has been postponed till patients on the floor.  Patient is abdomen is soft she is not very distended and there were a few bowel sounds.  Objective: Vital signs in last 24 hours: Temp:  [98.4 F (36.9 C)-101.3 F (38.5 C)] 99.8 F (37.7 C) (02/12 0615) Pulse Rate:  [94-118] 98 (02/12 0615) Resp:  [9-34] 21 (02/12 0615) BP: (93-142)/(61-92) 93/63 (02/12 0615) SpO2:  [94 %-100 %] 98 % (02/12 0615) Weight:  [60 kg] 60 kg (02/11 1300)    No I/O recorded  Tm 99.8,   HR up to 110,  Creatinine is 2.07 WBC 15.6 H/H 10.5/33.7 Stable enteric tube, tip in the proximal duodenum. 2. Persistent small bowel obstruction pattern, not changed from the CT scout view yesterday.   Intake/Output from previous day: 02/11 0701 - 02/12 0700 In: 2600 [IV Piggyback:2600] Out: 900  Intake/Output this shift: No intake/output data recorded.  General appearance: alert and She was lethargic and did not try and speak this a.m., just laid and looks at me most of the time. Resp: Clear anterior GI: Soft she is not really distended there if you bowel sounds.  She did have a bowel movement with enema yesterday.  Lab Results:  Recent Labs    03/08/20 1118 03/09/20 0435  WBC 13.6* 15.6*  HGB 12.0 10.5*  HCT 39.8 33.7*  PLT 257 162    BMET Recent Labs    03/08/20 1118 03/09/20 0435  NA 140 140  K 4.6 4.5  CL 111 113*  CO2 13* 11*  GLUCOSE 158* 140*  BUN 72* 75*  CREATININE 2.06* 2.07*  CALCIUM 8.9 8.5*   PT/INR Recent Labs    03/08/20 1118 03/09/20 0435  LABPROT 15.4* 21.8*  INR 1.3* 2.0*     Recent Labs  Lab 03/08/20 1118 03/09/20 0435  AST 17 24  ALT 11 9  ALKPHOS 61 51  BILITOT 0.9 0.7  PROT 7.0 5.8*  ALBUMIN 3.6 2.7*     Lipase     Component Value Date/Time   LIPASE 40 01/09/2017 2041     Medications: . enoxaparin (LOVENOX) injection  30 mg Subcutaneous Q24H  . sodium chloride flush  3 mL Intravenous Q12H    Assessment/Plan Type 2 diabetes CAD Hx CHF COPD Obstructive sleep apnea with CPAP Hx CVA with hemiparesis, aphasia and dysphagia Stage IV CKD Hx chronic pain/long-term opiate use (oxycodone 10 mg; BID) Hx chronic constipation (02/14/16 Homebound Anemia Hx sacral wound decubitus with osteomyelitis -ID Dr. Karolee Ohs following IV antibiotics completed 01/18/2019 COVID + 02/27/20  Sepsis ? UTI SBO  FEN:  IV fluids/NPO ID:  Maxipime, Flagyl, vancomycin 2/11>> DVT: Lovenox  Plan: Agree with ongoing antibiotics, continue NG decompression, small bowel protocol once patient is transferred to the floor.  Also going to recommend the irrigate the NG tube with air every 4 hours to be sure it is functioning.      LOS: 1 day    Wynonna Fitzhenry 03/09/2020 Please see Amion

## 2020-03-10 ENCOUNTER — Inpatient Hospital Stay (HOSPITAL_COMMUNITY): Payer: Medicare Other

## 2020-03-10 DIAGNOSIS — N184 Chronic kidney disease, stage 4 (severe): Secondary | ICD-10-CM | POA: Diagnosis not present

## 2020-03-10 DIAGNOSIS — K56609 Unspecified intestinal obstruction, unspecified as to partial versus complete obstruction: Secondary | ICD-10-CM | POA: Diagnosis not present

## 2020-03-10 DIAGNOSIS — A419 Sepsis, unspecified organism: Secondary | ICD-10-CM | POA: Diagnosis not present

## 2020-03-10 DIAGNOSIS — L8990 Pressure ulcer of unspecified site, unspecified stage: Secondary | ICD-10-CM | POA: Diagnosis present

## 2020-03-10 DIAGNOSIS — I69391 Dysphagia following cerebral infarction: Secondary | ICD-10-CM | POA: Diagnosis not present

## 2020-03-10 LAB — CBC
HCT: 25 % — ABNORMAL LOW (ref 36.0–46.0)
Hemoglobin: 8.1 g/dL — ABNORMAL LOW (ref 12.0–15.0)
MCH: 30.2 pg (ref 26.0–34.0)
MCHC: 32.4 g/dL (ref 30.0–36.0)
MCV: 93.3 fL (ref 80.0–100.0)
Platelets: 143 10*3/uL — ABNORMAL LOW (ref 150–400)
RBC: 2.68 MIL/uL — ABNORMAL LOW (ref 3.87–5.11)
RDW: 17 % — ABNORMAL HIGH (ref 11.5–15.5)
WBC: 13.3 10*3/uL — ABNORMAL HIGH (ref 4.0–10.5)
nRBC: 0 % (ref 0.0–0.2)

## 2020-03-10 LAB — COMPREHENSIVE METABOLIC PANEL
ALT: 10 U/L (ref 0–44)
AST: 21 U/L (ref 15–41)
Albumin: 2.4 g/dL — ABNORMAL LOW (ref 3.5–5.0)
Alkaline Phosphatase: 51 U/L (ref 38–126)
Anion gap: 14 (ref 5–15)
BUN: 80 mg/dL — ABNORMAL HIGH (ref 8–23)
CO2: 15 mmol/L — ABNORMAL LOW (ref 22–32)
Calcium: 8.3 mg/dL — ABNORMAL LOW (ref 8.9–10.3)
Chloride: 113 mmol/L — ABNORMAL HIGH (ref 98–111)
Creatinine, Ser: 2.13 mg/dL — ABNORMAL HIGH (ref 0.44–1.00)
GFR, Estimated: 25 mL/min — ABNORMAL LOW (ref >60)
Glucose, Bld: 122 mg/dL — ABNORMAL HIGH (ref 70–99)
Potassium: 3.3 mmol/L — ABNORMAL LOW (ref 3.5–5.1)
Sodium: 142 mmol/L (ref 135–145)
Total Bilirubin: 1.4 mg/dL — ABNORMAL HIGH (ref 0.3–1.2)
Total Protein: 5.4 g/dL — ABNORMAL LOW (ref 6.5–8.1)

## 2020-03-10 LAB — LACTIC ACID, PLASMA: Lactic Acid, Venous: 1.2 mmol/L (ref 0.5–1.9)

## 2020-03-10 MED ORDER — DIATRIZOATE MEGLUMINE & SODIUM 66-10 % PO SOLN
90.0000 mL | Freq: Once | ORAL | Status: AC
  Administered 2020-03-10: 90 mL via NASOGASTRIC
  Filled 2020-03-10: qty 90

## 2020-03-10 MED ORDER — POTASSIUM CHLORIDE 10 MEQ/100ML IV SOLN
10.0000 meq | INTRAVENOUS | Status: AC
Start: 2020-03-10 — End: 2020-03-10
  Administered 2020-03-10 (×4): 10 meq via INTRAVENOUS
  Filled 2020-03-10 (×4): qty 100

## 2020-03-10 NOTE — Progress Notes (Signed)
Subjective:   Hospital day:2  Overnight event:None  Michelle Holmes evaluated at bedside this morning.  She is resting comfortably and is difficult to arouse.  However, she did save Dilaudid prior to my examination.  NG tube in place with approximately 400 cc of [place] bilious drainage.  RN at bedside noted some swelling to the right side of the labia.  I called and spoke to Michelle Holmes, about Michelle current medical conditions.  Discussed slight improvement in Michelle vitals and labs; however, discussed that patient is still critically ill.  She expresses understanding.  Objective:  Vital signs in last 24 hours: Vitals:   03/09/20 2000 03/09/20 2339 03/10/20 0317 03/10/20 0459  BP: 103/62 107/73 118/69 124/69  Pulse: 88 95 86 85  Resp: 18 20 18 18   Temp: 99.2 F (37.3 C) 100.2 F (37.9 C) 100.1 F (37.8 C) 99.5 F (37.5 C)  TempSrc: Oral Oral Oral Oral  SpO2: 100% 98% 98% 98%  Weight:      Height:       CBC Latest Ref Rng & Units 03/10/2020 03/09/2020 03/08/2020  WBC 4.0 - 10.5 K/uL 13.3(H) 15.6(H) 13.6(H)  Hemoglobin 12.0 - 15.0 g/dL 8.1(L) 10.5(L) 12.0  Hematocrit 36.0 - 46.0 % 25.0(L) 33.7(L) 39.8  Platelets 150 - 400 K/uL 143(L) 162 257   CMP Latest Ref Rng & Units 03/10/2020 03/09/2020 03/08/2020  Glucose 70 - 99 mg/dL 122(H) 140(H) 158(H)  BUN 8 - 23 mg/dL 80(H) 75(H) 72(H)  Creatinine 0.44 - 1.00 mg/dL 2.13(H) 2.07(H) 2.06(H)  Sodium 135 - 145 mmol/L 142 140 140  Potassium 3.5 - 5.1 mmol/L 3.3(L) 4.5 4.6  Chloride 98 - 111 mmol/L 113(H) 113(H) 111  CO2 22 - 32 mmol/L 15(L) 11(L) 13(L)  Calcium 8.9 - 10.3 mg/dL 8.3(L) 8.5(L) 8.9  Total Protein 6.5 - 8.1 g/dL 5.4(L) 5.8(L) 7.0  Total Bilirubin 0.3 - 1.2 mg/dL 1.4(H) 0.7 0.9  Alkaline Phos 38 - 126 U/L 51 51 61  AST 15 - 41 U/L 21 24 17   ALT 0 - 44 U/L 10 9 11    Physical Exam  Constitutional: Elderly, chronically ill appearing, cachectic Holmes.. No acute distress.  Cardiovascular: Normal rate,  regular rhythm, S1 and S2 present, no murmurs, rubs, gallops.  Distal pulses intact Respiratory: No respiratory distress, no accessory muscle use. Lungs are clear to auscultation bilaterally on anterior auscultation. GI: Nondistended, soft, nontender to palpation, some bowel sounds present Musculoskeletal: Normal bulk and tone.  No peripheral edema noted.  Right-sided contractures  Assessment/Plan: Michelle Holmes with PMHx of via anemia, CHF, COPD, CAD, CVA with residual right hemiparesis, decubitus ulcer, chronic indwelling catheter, history of sacral osteomyelitis and recent Covid infection who presenting from home for altered mental status, found to have sepsis and small bowel obstruction.  Active Problems:   Right hemiplegia (HCC)   Hemiparesis, aphasia, and dysphagia as late effect of cerebrovascular accident (CVA) ([place])   Chronic kidney disease (CKD), stage IV (severe) (HCC)   Sepsis (HCC)   Small bowel obstruction (HCC)   Altered mental status Sepsis 2/2 UTI Patient presenting with altered mental status in setting of sepsis secondary to UTI.  Cultures with 100 K colonies of E. Coli and Providencia Stuartii.  Blood cultures are negative to date.  Patient is currently on IV cefepime.  Michelle vital signs are improved today with fluid resuscitation.  Lactic acid trended down with most recent level of 1.7.  Patient is continued on maintenance  fluid in setting of altered mental status and decreased oral intake. -Continue IV cefepime day 3 -Follow-up culture sensitivities -Follow-up blood cultures -Continue maintenance fluids  Palliative consult for assistance with goal of care discussion. -Appreciate palliative team recommendation    Small Bowel Obstruction   Patient noted to have a small bowel obstruction on CT abdomen pelvis on presentation.  SBO protocol initiated with NG tube suction.  Patient has an approximately 900 cc of biliary output from NG tube.  Repeat  abdominal x-ray this morning with unchanged SBO.  She has had 2 bowel movements overnight and one this morning.  Possibility that this may be a functional SBO.  -Surgery consulted, appreciate the recommendations -NG tube with intermittent suction -Continue n.p.o. -Continue maintenance fluids  CKD Serum creatinine at baseline -Monitor BMP   Hypertension Currently normotensive -Holding amlodipine in the setting of sepsis   Hyperlipidemia -Holding atorvastatin due to SBO   Adrenal adenoma Found on CT. can follow-up outpatient     CODE STATUS: Full  DVT: Lovenox IV fluid: LR 75 cc/h Diet: N.p.o.    Prior to Admission Living Arrangement: Home Anticipated Discharge Location: To be determined Barriers to Discharge: Continue medical work-up  Harvie Heck, MD IMTS PGY-2 03/10/2020, 7:14 AM Pager: [phone] After 5pm on weekdays and 1pm on weekends: On Call pager [phone]

## 2020-03-10 NOTE — Progress Notes (Signed)
    Michelle Holmes, bedbound secondary to stroke, on antibiotics, altered mental status   Subjective: She has been to lethargic to try and take any p.o.'s.  She had a bowel movement after enema downstairs in the ED.  She had 2 more for the night nurse.  There was nothing coming out of her NG this morning I irrigated it with air and it started draining again.  Still thick green drainage.  Objective: Vital signs in last 24 hours: Temp:  [97.8 F (36.6 C)-100.2 F (37.9 C)] 99.5 F (37.5 C) (02/13 0459) Pulse Rate:  [85-111] 85 (02/13 0459) Resp:  [17-29] 18 (02/13 0459) BP: (95-124)/(60-96) 124/69 (02/13 0459) SpO2:  [98 %-100 %] 98 % (02/13 0459) Last BM Date: 03/09/20 02/12 0701 - 02/13 0700 In: 4116.3 [I.V.:3016.3; IV Piggyback:1100] Out: 545 [Urine:300; Emesis/NG output:245]  4116 IV Urine 300 NG to 45 BM x2 T-max 100.2, low blood pressure 99/53 Sats good on room air. Potassium 3.3 Creatinine 2.13 Total bilirubin 1.4 WBC 13.5 H/H 8.1/25 Platelets 140 3K No film this a.m.   Intake/Output this shift: No intake/output data recorded.  General appearance: alert, cooperative, appears older than stated age, no distress and Patient still not responding much this AM.  Very lethargic. Resp: clear to auscultation bilaterally GI: Soft, she is not distended she is not tender, she has had some bowel movements.  Lab Results:  Recent Labs    03/09/20 0435 03/10/20 0115  WBC 15.6* 13.3*  HGB 10.5* 8.1*  HCT 33.7* 25.0*  PLT 162 143*    BMET Recent Labs    03/09/20 0435 03/10/20 0115  NA 140 142  K 4.5 3.3*  CL 113* 113*  CO2 11* 15*  GLUCOSE 140* 122*  BUN 75* 80*  CREATININE 2.07* 2.13*  CALCIUM 8.5* 8.3*   PT/INR Recent Labs    03/08/20 1118 03/09/20 0435  LABPROT 15.4* 21.8*  INR 1.3* 2.0*    Recent Labs  Lab 03/08/20 1118 03/09/20 0435 03/10/20 0115  AST 17 24 21   ALT 11 9 10   ALKPHOS 61 51 51  BILITOT 0.9 0.7 1.4*  PROT 7.0 5.8* 5.4*   ALBUMIN 3.6 2.7* 2.4*     Lipase     Component Value Date/Time   LIPASE 40 01/09/2017 2041     Medications: . Chlorhexidine Gluconate Cloth  6 each Topical Daily  . enoxaparin (LOVENOX) injection  30 mg Subcutaneous Q24H  . mouth rinse  15 mL Mouth Rinse BID  . sodium chloride flush  3 mL Intravenous Q12H    Assessment/Plan Type 2 diabetes CAD Hx CHF COPD Obstructive sleep apnea with CPAP Hx CVA with hemiparesis, aphasia and dysphagia Stage IV CKD Hx chronic pain/long-term opiate use(oxycodone 10 mg; BID) Hx chronic constipation(02/14/16 Homebound Anemia Hx sacral wound decubitus with osteomyelitis-ID Dr. Karolee Ohs following IV antibiotics completed 01/18/2019 COVID + 02/27/20  Sepsis ? UTI SBO  FEN: IV fluids/NPO GD:[id number], Flagyl, vancomycin 2/11>> DVT: Lovenox Intake/Output from previous day:  Plan: I do not think she has a small bowel obstruction but we will do the small bowel protocol just to make sure.     LOS: 2 days    Cove Haydon 03/10/2020 Please see Amion

## 2020-03-10 NOTE — Progress Notes (Signed)
Pt with swelling to right side of labia, MD to room and assessed. Will monitor.

## 2020-03-10 NOTE — Plan of Care (Signed)

## 2020-03-10 NOTE — Progress Notes (Signed)
Daily Progress Note   Patient Name: Michelle Holmes       Date: 03/10/2020 DOB: 11-29-1950  Age: 70 y.o. MRN#: [id number] Attending Physician: Oda Kilts, MD Primary Care Physician: Gaylan Gerold, DO Admit Date: 03/08/2020  Reason for Follow-up: continued GOC discussion  Subjective: 17:30--Patient remains lethargic. Responds to some yes and no questions. Continues to appear uncomfortable, frequently moaning.   19:20--I spoke with [name] by phone and provided and update on patient condition. She verbalizes understanding there is no significant improvement so far. At this time, she wants to continue watchful waiting. She remains very open and receptive to transition to comfort care in the coming days, if there is no improvement or with further decline.  I emphasize that patient appears to be in pain, and we can better manage her pain if the goal is comfort. Also let [name] know that visitation will be unrestricted once patient transitions to comfort care.   [name] expresses appreciation for the phone call and wants to schedule a family meeting either Monday or Wednesday.   Length of Stay: 2  Current Medications: Scheduled Meds:  . Chlorhexidine Gluconate Cloth  6 each Topical Daily  . enoxaparin (LOVENOX) injection  30 mg Subcutaneous Q24H  . mouth rinse  15 mL Mouth Rinse BID  . sodium chloride flush  3 mL Intravenous Q12H    Continuous Infusions: . ceFEPime (MAXIPIME) IV 2 g (03/10/20 1305)  . potassium chloride 10 mEq (03/10/20 1643)    PRN Meds: HYDROmorphone (DILAUDID) injection  Physical Exam Vitals reviewed.  Constitutional:      General: She is not in acute distress.    Appearance: She is ill-appearing.     Comments: Very frail  Cardiovascular:     Rate and Rhythm:  Normal rate and regular rhythm.  Pulmonary:     Effort: Pulmonary effort is normal.  Neurological:     Mental Status: She is lethargic.             Vital Signs: BP 121/62   Pulse 91   Temp 98.8 F (37.1 C) (Oral)   Resp (!) 21   Ht 5\' 2"  (1.575 m)   Wt 60 kg   SpO2 96%   BMI 24.19 kg/m  SpO2: SpO2: 96 % O2 Device: O2 Device: Room Air O2 Flow Rate:  Intake/output summary:   Intake/Output Summary (Last 24 hours) at 03/10/2020 1732 Last data filed at 03/10/2020 1301 Gross per 24 hour  Intake 1116.18 ml  Output 1050 ml  Net 66.18 ml   LBM: Last BM Date: 03/10/20 Baseline Weight: Weight: 60 kg Most recent weight: Weight: 60 kg       Palliative Assessment/Data: PPS 10%     Palliative Care Assessment & Plan   HPI/Patient Profile: 70 y.o. female  with past medical history of CVA with residual right hemiparesis, chronic indwelling catheter, decubitus ulcer, CKD stage IV-V, CHF, COPD, CAD, anemia, osteomyelitis, and recent Covid infection. She presented to the emergency department on 03/08/2020 with altered mental status.  ED Course: CT scan showed distal high grade small bowel obstruction, moderate anasarca with mild ascites, and right adrenal adenoma. Lactic acid 3.5, trending down. WBC 13.6, urinalysis with many bacteria. Creatinine 2.06, at baseline. Patient admitted to Internal Medicine TS with sepsis likely secondary to UTI and small bowel obstruction.   Assessment: - sepsis secondary to UTI - ? SBO - altered mental status - CKD stage IV-V - hx of CVA with residual right hemiplegia, aphasia  Recommendations/Plan: DNR/DNI as previously documented Continue current medical care Continue watchful waiting Daughter [name] open to transition to comfort care if patient does not decline or with further decline Possible family meeting Monday or Wednesday Goal of care is for patient to return home, possibly with hospice  Goals of Care and Additional  Recommendations: Limitations on Scope of Treatment: Full Scope Treatment  Code Status: DNR/DNI  Prognosis: Poor overall  Discharge Planning: To Be Determined   Thank you for allowing the Palliative Medicine Team to assist in the care of this patient.   Total Time 15 minutes Prolonged Time Billed  no       Greater than 50%  of this time was spent counseling and coordinating care related to the above assessment and plan.  Lavena Bullion, NP  Please contact Palliative Medicine Team phone at [phone] for questions and concerns.

## 2020-03-11 ENCOUNTER — Inpatient Hospital Stay (HOSPITAL_COMMUNITY): Payer: Medicare Other

## 2020-03-11 DIAGNOSIS — G8100 Flaccid hemiplegia affecting unspecified side: Secondary | ICD-10-CM

## 2020-03-11 DIAGNOSIS — G8191 Hemiplegia, unspecified affecting right dominant side: Secondary | ICD-10-CM | POA: Diagnosis not present

## 2020-03-11 DIAGNOSIS — R4182 Altered mental status, unspecified: Secondary | ICD-10-CM | POA: Diagnosis not present

## 2020-03-11 DIAGNOSIS — A419 Sepsis, unspecified organism: Secondary | ICD-10-CM | POA: Diagnosis not present

## 2020-03-11 DIAGNOSIS — K56600 Partial intestinal obstruction, unspecified as to cause: Secondary | ICD-10-CM

## 2020-03-11 LAB — CBC
HCT: 23.7 % — ABNORMAL LOW (ref 36.0–46.0)
Hemoglobin: 7.6 g/dL — ABNORMAL LOW (ref 12.0–15.0)
MCH: 29.8 pg (ref 26.0–34.0)
MCHC: 32.1 g/dL (ref 30.0–36.0)
MCV: 92.9 fL (ref 80.0–100.0)
Platelets: 167 10*3/uL (ref 150–400)
RBC: 2.55 MIL/uL — ABNORMAL LOW (ref 3.87–5.11)
RDW: 17 % — ABNORMAL HIGH (ref 11.5–15.5)
WBC: 12 10*3/uL — ABNORMAL HIGH (ref 4.0–10.5)
nRBC: 0 % (ref 0.0–0.2)

## 2020-03-11 LAB — BASIC METABOLIC PANEL
Anion gap: 14 (ref 5–15)
BUN: 84 mg/dL — ABNORMAL HIGH (ref 8–23)
CO2: 15 mmol/L — ABNORMAL LOW (ref 22–32)
Calcium: 8.7 mg/dL — ABNORMAL LOW (ref 8.9–10.3)
Chloride: 112 mmol/L — ABNORMAL HIGH (ref 98–111)
Creatinine, Ser: 2.49 mg/dL — ABNORMAL HIGH (ref 0.44–1.00)
GFR, Estimated: 20 mL/min — ABNORMAL LOW (ref >60)
Glucose, Bld: 128 mg/dL — ABNORMAL HIGH (ref 70–99)
Potassium: 3.5 mmol/L (ref 3.5–5.1)
Sodium: 141 mmol/L (ref 135–145)

## 2020-03-11 LAB — COMPREHENSIVE METABOLIC PANEL
ALT: 12 U/L (ref 0–44)
AST: 19 U/L (ref 15–41)
Albumin: 2.4 g/dL — ABNORMAL LOW (ref 3.5–5.0)
Alkaline Phosphatase: 60 U/L (ref 38–126)
Anion gap: 14 (ref 5–15)
BUN: 85 mg/dL — ABNORMAL HIGH (ref 8–23)
CO2: 16 mmol/L — ABNORMAL LOW (ref 22–32)
Calcium: 8.7 mg/dL — ABNORMAL LOW (ref 8.9–10.3)
Chloride: 114 mmol/L — ABNORMAL HIGH (ref 98–111)
Creatinine, Ser: 2.47 mg/dL — ABNORMAL HIGH (ref 0.44–1.00)
GFR, Estimated: 21 mL/min — ABNORMAL LOW (ref >60)
Glucose, Bld: 88 mg/dL (ref 70–99)
Potassium: 3.3 mmol/L — ABNORMAL LOW (ref 3.5–5.1)
Sodium: 144 mmol/L (ref 135–145)
Total Bilirubin: 1.9 mg/dL — ABNORMAL HIGH (ref 0.3–1.2)
Total Protein: 5.9 g/dL — ABNORMAL LOW (ref 6.5–8.1)

## 2020-03-11 LAB — URINE CULTURE: Culture: >=100000 — AB

## 2020-03-11 LAB — MAGNESIUM: Magnesium: 2.1 mg/dL (ref 1.7–2.4)

## 2020-03-11 MED ORDER — DEXTROSE 5 % IV SOLN: INTRAVENOUS | Status: DC

## 2020-03-11 MED ORDER — LACTATED RINGERS IV SOLN: INTRAVENOUS | Status: DC

## 2020-03-11 MED ORDER — DEXTROSE IN LACTATED RINGERS 5 % IV SOLN: INTRAVENOUS | Status: AC

## 2020-03-11 MED ORDER — POTASSIUM CHLORIDE 10 MEQ/100ML IV SOLN
10.0000 meq | INTRAVENOUS | Status: AC
  Administered 2020-03-11 (×4): 10 meq via INTRAVENOUS
  Filled 2020-03-11 (×4): qty 100

## 2020-03-11 NOTE — Care Management Important Message (Signed)
Important Message  Patient Details  Name: LEMMA TETRO MRN: [id number] Date of Birth: 03-Feb-1950   Medicare Important Message Given:  Yes     Soriyah Osberg 03/11/2020, 2:10 PM

## 2020-03-11 NOTE — Progress Notes (Signed)
Brief Palliative Medicine Progress Note:  Attempted to call daughter/Denise to discuss setting up family meeting - no answer - confidential voicemail left and PMT phone number provided with request to return call.  Did not receive return call today - will attempt to reach out again tomorrow 2/15.   Thank you for allowing PMT to assist in the care of this patient.  Antoniette Peake M. Tamala Julian [facility] Palliative Medicine Team Team Phone: [phone] NO CHARGE

## 2020-03-11 NOTE — Progress Notes (Signed)
Subjective:   Hospital day: No OV  Overnight event:None  [name] was evaluated at bedside this morning. She is resting comfortably. Does open eyes to verbal stimuli. Intermittently answering yes/no questions by moving her head. Is able to follow commands.   I called and spoke to patient's daughter, Michelle Holmes.  I explained to her the patient's current medical issues.  We will continue current treatment and monitor for any clinical improvement.  Michelle Holmes expressed that if there is no improvement, she would like patient to go home with home hospice.  Objective:  Vital signs in last 24 hours: Vitals:   03/10/20 1400 03/10/20 1947 03/10/20 2332 03/11/20 0358  BP: 121/62 (!) 104/58 109/78 112/74  Pulse: 91 78 79 68  Resp: (!) 21 20 20 20   Temp:  97.9 F (36.6 C) 98 F (36.7 C) 97.7 F (36.5 C)  TempSrc:  Axillary Oral Oral  SpO2: 96% 98% 99% 100%  Weight:      Height:       CBC Latest Ref Rng & Units 03/11/2020 03/10/2020 03/09/2020  WBC 4.0 - 10.5 K/uL 12.0(H) 13.3(H) 15.6(H)  Hemoglobin 12.0 - 15.0 g/dL 7.6(L) 8.1(L) 10.5(L)  Hematocrit 36.0 - 46.0 % 23.7(L) 25.0(L) 33.7(L)  Platelets 150 - 400 K/uL 167 143(L) 162   CMP Latest Ref Rng & Units 03/11/2020 03/10/2020 03/09/2020  Glucose 70 - 99 mg/dL 88 122(H) 140(H)  BUN 8 - 23 mg/dL 85(H) 80(H) 75(H)  Creatinine 0.44 - 1.00 mg/dL 2.47(H) 2.13(H) 2.07(H)  Sodium 135 - 145 mmol/L 144 142 140  Potassium 3.5 - 5.1 mmol/L 3.3(L) 3.3(L) 4.5  Chloride 98 - 111 mmol/L 114(H) 113(H) 113(H)  CO2 22 - 32 mmol/L 16(L) 15(L) 11(L)  Calcium 8.9 - 10.3 mg/dL 8.7(L) 8.3(L) 8.5(L)  Total Protein 6.5 - 8.1 g/dL 5.9(L) 5.4(L) 5.8(L)  Total Bilirubin 0.3 - 1.2 mg/dL 1.9(H) 1.4(H) 0.7  Alkaline Phos 38 - 126 U/L 60 51 51  AST 15 - 41 U/L 19 21 24   ALT 0 - 44 U/L 12 10 9     Physical Exam  Physical Exam Constitutional:      Appearance: She is ill-appearing.     Comments: Somnolent but arousable to verbal stimulation.  Able to follow simple  commands.  Cannot hold a conversation.   HENT:     Head: Normocephalic.  Eyes:     General:        Right eye: No discharge.        Left eye: No discharge.  Cardiovascular:     Rate and Rhythm: Normal rate and regular rhythm.  Pulmonary:     Effort: Pulmonary effort is normal. No respiratory distress.  Abdominal:     General: There is no distension.     Comments: 1 bowel sound auscultated  Musculoskeletal:     Right lower leg: No edema.     Left lower leg: No edema.  Skin:    General: Skin is warm.     Assessment/Plan: SusanDarkis a53 year old female with PMHx ofvia anemia, CHF, COPD, CAD, CVA with residual right hemiparesis,decubitus ulcer, chronic indwelling catheter,history of sacral osteomyelitis and recent Covid infection who presenting from home for altered mental status,found to have sepsis 2/2 UTI and a small bowel obstruction.  Active Problems:   Right hemiplegia (HCC)   Hemiparesis, aphasia, and dysphagia as late effect of cerebrovascular accident (CVA) ([place])   Chronic kidney disease (CKD), stage IV (severe) (HCC)   Sepsis (HCC)   Small bowel obstruction (  [place])   Pressure injury of skin  Altered mental status Sepsis 2/2 UTI -Patient presenting with altered mental status in setting of sepsis secondary to UTI. Cultures with 100 K colonies of E. Coli and Providencia Stuartii.  Blood cultures are negative to date.  Patient is currently on IV cefepime.  -Blood pressure stable with resolution of tachycardia.  BMP showed anion gap metabolic acidosis likely due to starvation ketoacidosis.  Will start D5 100 cc/h for maintenance fluid. -Palliative team has spoken to family and Michelle Holmes, patient's POA.  Michelle Holmes is the patient's caregiver and understand the patient's current medical condition.  Michelle Holmes expressed that she agrees with transition to comfort care and home hospice if there is no improvement with our current treatment. - Continue IV cefepime (day 4) - Follow-up blood  cultures.  Negative to date - Continue maintenance fluids with D5 100 cc/h - Appreciate palliative team recommendation   Small Bowel Obstruction  Patient had about 2000 cc from NG suction yesterday.  Minimal output today.  Patient has had 2 bowel movements.  However repeat KUB this morning still show persistent obstruction with small bowel dilation.  We will continue NG suction at this time.  Patient is not a candidate for surgical preventions given her multiple comorbidities. - Surgery consulted, appreciate the recommendations - NG tube with intermittent suction - Continue n.p.o. - Continue maintenance fluids   CKD Serum creatinine trending up today from 2.13 to 2.47.  Likely prerenal from n.p.o. and sepsis.  Continue maintenance fluids with D5. - Monitor BMP   Hypertension Currently normotensive - Holding amlodipine in the setting of sepsis   Hyperlipidemia - Holding atorvastatin due to SBO   Adrenal adenoma - Found on CT.can follow-up outpatient   CODE STATUS: DNR DVT: Lovenox IV fluid: D5 100cc/h Diet: N.p.o.   Prior to Admission Living Arrangement: Home Anticipated Discharge Location: To be determined Barriers to Discharge: Continue medical work-up  Gaylan Gerold, DO 03/11/2020, 7:06 AM Pager: [phone] After 5pm on weekdays and 1pm on weekends: On Call pager [phone]

## 2020-03-11 NOTE — Progress Notes (Signed)
Subjective: Patient essentially nonverbal except some moaning.  Unable to tell if her abdomen hurts this morning.  She was sleeping comfortably when I walked in the room.  Noted to have had 2 BMs either yesterday or Saturday.  1700cc from NGT yesterday noted.   ROS: See above, otherwise other systems negative  Objective: Vital signs in last 24 hours: Temp:  [97.7 F (36.5 C)-98.8 F (37.1 C)] 98.4 F (36.9 C) (02/14 0700) Pulse Rate:  [62-91] 62 (02/14 0700) Resp:  [14-21] 15 (02/14 0700) BP: (98-124)/(54-78) 124/75 (02/14 0700) SpO2:  [95 %-100 %] 97 % (02/14 0700) Last BM Date: 03/10/20  Intake/Output from previous day: 02/13 0701 - 02/14 0700 In: 200 [IV Piggyback:200] Out: 2000 [Urine:300; Emesis/NG output:1700] Intake/Output this shift: No intake/output data recorded.  PE: Gen: frail, chronically ill-appearing female, but NAD Abd: soft, can't tell if she is tender or doesn't like me touching her because my hands are cold, few BS, nondistended  Lab Results:  Recent Labs    03/10/20 0115 03/11/20 0603  WBC 13.3* 12.0*  HGB 8.1* 7.6*  HCT 25.0* 23.7*  PLT 143* 167   BMET Recent Labs    03/10/20 0115 03/11/20 0603  NA 142 144  K 3.3* 3.3*  CL 113* 114*  CO2 15* 16*  GLUCOSE 122* 88  BUN 80* 85*  CREATININE 2.13* 2.47*  CALCIUM 8.3* 8.7*   PT/INR Recent Labs    03/08/20 1118 03/09/20 0435  LABPROT 15.4* 21.8*  INR 1.3* 2.0*   CMP     Component Value Date/Time   NA 144 03/11/2020 0603   NA 142 12/16/2018 1007   K 3.3 (L) 03/11/2020 0603   CL 114 (H) 03/11/2020 0603   CO2 16 (L) 03/11/2020 0603   GLUCOSE 88 03/11/2020 0603   BUN 85 (H) 03/11/2020 0603   BUN 65 (H) 12/16/2018 1007   CREATININE 2.47 (H) 03/11/2020 0603   CALCIUM 8.7 (L) 03/11/2020 0603   PROT 5.9 (L) 03/11/2020 0603   ALBUMIN 2.4 (L) 03/11/2020 0603   AST 19 03/11/2020 0603   ALT 12 03/11/2020 0603   ALKPHOS 60 03/11/2020 0603   BILITOT 1.9 (H) 03/11/2020 0603    GFRNONAA 21 (L) 03/11/2020 0603   GFRAA 12 (L) 12/16/2018 1007   Lipase     Component Value Date/Time   LIPASE 40 01/09/2017 2041       Studies/Results: DG Abd Portable 1V-Small Bowel Obstruction Protocol-initial, 8 hr delay  Result Date: 03/10/2020 CLINICAL DATA:  Small bowel obstruction, 8 hour delay. EXAM: PORTABLE ABDOMEN - 1 VIEW COMPARISON:  Radiographs earlier today.  CT 03/08/2020 FINDINGS: Tip of the enteric tube in the right upper quadrant likely in the distal stomach or proximal duodenum. Persistent dilated small bowel in the central abdomen. Contrast opacifies small bowel loops. There is no contrast in the colon. Cholecystectomy clips in the right upper quadrant. Severe scoliotic curvature distorts normal anatomy. IMPRESSION: 1. Tip of the enteric tube in the distal stomach or proximal duodenum. 2. Persistent small bowel obstruction with administered contrast in dilated small bowel. No contrast in the colon. Electronically Signed   By: Keith Rake M.D.   On: 03/10/2020 19:12   DG Abd Portable 1V-Small Bowel Protocol-Position Verification  Result Date: 03/10/2020 CLINICAL DATA:  Small-bowel obstruction. EXAM: PORTABLE ABDOMEN - 1 VIEW COMPARISON:  Abdominal x-ray from yesterday. FINDINGS: Unchanged enteric tube with tip in the proximal duodenum. Multiple dilated loops of small bowel are unchanged. IMPRESSION:  1. Unchanged small bowel obstruction. Electronically Signed   By: Titus Dubin M.D.   On: 03/10/2020 11:36    Anti-infectives: Anti-infectives (From admission, onward)   Start     Dose/Rate Route Frequency Ordered Stop   03/10/20 1200  vancomycin (VANCOREADY) IVPB 1000 mg/200 mL  Status:  Discontinued        1,000 mg 200 mL/hr over 60 Minutes Intravenous Every 48 hours 03/08/20 1317 03/09/20 1505   03/09/20 1300  ceFEPIme (MAXIPIME) 2 g in sodium chloride 0.9 % 100 mL IVPB        2 g 200 mL/hr over 30 Minutes Intravenous Every 24 hours 03/08/20 1312     03/08/20  1315  vancomycin (VANCOREADY) IVPB 1250 mg/250 mL        1,250 mg 166.7 mL/hr over 90 Minutes Intravenous  Once 03/08/20 1249 03/08/20 1604   03/08/20 1300  aztreonam (AZACTAM) 2 g in sodium chloride 0.9 % 100 mL IVPB  Status:  Discontinued        2 g 200 mL/hr over 30 Minutes Intravenous  Once 03/08/20 1249 03/08/20 1259   03/08/20 1300  metroNIDAZOLE (FLAGYL) IVPB 500 mg        500 mg 100 mL/hr over 60 Minutes Intravenous  Once 03/08/20 1249 03/08/20 1554   03/08/20 1300  ceFEPIme (MAXIPIME) 2 g in sodium chloride 0.9 % 100 mL IVPB        2 g 200 mL/hr over 30 Minutes Intravenous  Once 03/08/20 1259 03/08/20 1411       Assessment/Plan Type 2 diabetes CAD Hx CHF COPD Obstructive sleep apnea with CPAP Hx CVA with hemiparesis, aphasia and dysphagia Stage IV CKD Hx chronic pain/long-term opiate use(oxycodone 10 mg; BID) Hx chronic constipation(02/14/16 Homebound Anemia Hx sacral wound decubitus with osteomyelitis-ID Dr. Karolee Ohs following IV antibiotics completed 01/18/2019 COVID + 02/27/20 Sepsis UTI - per medicine  SBO -awaiting film read today.  Difficult to tell if air I'm seeing if small bowel or colon.   -she has minimal output from her NGT right now but noted 1700 since yesterday -agree with palliative involvement as patient would not be a good surgical candidate -continue to follow.  May try clamping trials soon if output goes down.  FEN: IV fluids/NPO/NGT JW:[id number], Flagyl, vancomycin 2/11>> DVT: Lovenox   LOS: 3 days    Henreitta Cea , [facility] 03/11/2020, 7:30 AM Please see Amion for pager number during day hours 7:00am-4:30pm or 7:00am -11:30am on weekends

## 2020-03-12 ENCOUNTER — Inpatient Hospital Stay (HOSPITAL_COMMUNITY): Payer: Medicare Other

## 2020-03-12 DIAGNOSIS — A419 Sepsis, unspecified organism: Secondary | ICD-10-CM | POA: Diagnosis not present

## 2020-03-12 DIAGNOSIS — G8100 Flaccid hemiplegia affecting unspecified side: Secondary | ICD-10-CM | POA: Diagnosis not present

## 2020-03-12 DIAGNOSIS — G8191 Hemiplegia, unspecified affecting right dominant side: Secondary | ICD-10-CM | POA: Diagnosis not present

## 2020-03-12 DIAGNOSIS — R4182 Altered mental status, unspecified: Secondary | ICD-10-CM | POA: Diagnosis not present

## 2020-03-12 LAB — COMPREHENSIVE METABOLIC PANEL
ALT: 11 U/L (ref 0–44)
AST: 16 U/L (ref 15–41)
Albumin: 2.3 g/dL — ABNORMAL LOW (ref 3.5–5.0)
Alkaline Phosphatase: 60 U/L (ref 38–126)
Anion gap: 12 (ref 5–15)
BUN: 80 mg/dL — ABNORMAL HIGH (ref 8–23)
CO2: 16 mmol/L — ABNORMAL LOW (ref 22–32)
Calcium: 8.6 mg/dL — ABNORMAL LOW (ref 8.9–10.3)
Chloride: 115 mmol/L — ABNORMAL HIGH (ref 98–111)
Creatinine, Ser: 2.39 mg/dL — ABNORMAL HIGH (ref 0.44–1.00)
GFR, Estimated: 21 mL/min — ABNORMAL LOW (ref >60)
Glucose, Bld: 173 mg/dL — ABNORMAL HIGH (ref 70–99)
Potassium: 3 mmol/L — ABNORMAL LOW (ref 3.5–5.1)
Sodium: 143 mmol/L (ref 135–145)
Total Bilirubin: 0.9 mg/dL (ref 0.3–1.2)
Total Protein: 5.7 g/dL — ABNORMAL LOW (ref 6.5–8.1)

## 2020-03-12 LAB — CBC
HCT: 23.9 % — ABNORMAL LOW (ref 36.0–46.0)
Hemoglobin: 7.8 g/dL — ABNORMAL LOW (ref 12.0–15.0)
MCH: 29.5 pg (ref 26.0–34.0)
MCHC: 32.6 g/dL (ref 30.0–36.0)
MCV: 90.5 fL (ref 80.0–100.0)
Platelets: 180 10*3/uL (ref 150–400)
RBC: 2.64 MIL/uL — ABNORMAL LOW (ref 3.87–5.11)
RDW: 17.1 % — ABNORMAL HIGH (ref 11.5–15.5)
WBC: 10.5 10*3/uL (ref 4.0–10.5)
nRBC: 0 % (ref 0.0–0.2)

## 2020-03-12 MED ORDER — POTASSIUM CHLORIDE 10 MEQ/100ML IV SOLN
10.0000 meq | INTRAVENOUS | Status: AC
  Administered 2020-03-12 (×4): 10 meq via INTRAVENOUS
  Filled 2020-03-12 (×4): qty 100

## 2020-03-12 NOTE — Progress Notes (Signed)
Subjective: Patient essentially nonverbal.  Shakes head no to abdominal pain.  Had 3 BMs per RN and minimal NGT output.  ROS: unable   Objective: Vital signs in last 24 hours: Temp:  [97.8 F (36.6 C)-98.3 F (36.8 C)] 98.1 F (36.7 C) (02/15 1043) Pulse Rate:  [62-81] 81 (02/15 1043) Resp:  [14-22] 22 (02/15 1043) BP: (110-143)/(63-85) 143/85 (02/15 1043) SpO2:  [95 %-99 %] 99 % (02/15 1043) Last BM Date: 03/12/20  Intake/Output from previous day: 02/14 0701 - 02/15 0700 In: 1877.9 [I.V.:1270; IV Piggyback:607.9] Out: 326 [Urine:325; Stool:1] Intake/Output this shift: No intake/output data recorded.  PE: Gen: frail, chronically ill-appearing female, but NAD Abd: soft, seems NT, no current NGT output. ND  Lab Results:  Recent Labs    03/11/20 0603 03/12/20 0032  WBC 12.0* 10.5  HGB 7.6* 7.8*  HCT 23.7* 23.9*  PLT 167 180   BMET Recent Labs    03/11/20 1812 03/12/20 0032  NA 141 143  K 3.5 3.0*  CL 112* 115*  CO2 15* 16*  GLUCOSE 128* 173*  BUN 84* 80*  CREATININE 2.49* 2.39*  CALCIUM 8.7* 8.6*   PT/INR No results for input(s): LABPROT, INR in the last 72 hours. CMP     Component Value Date/Time   NA 143 03/12/2020 0032   NA 142 12/16/2018 1007   K 3.0 (L) 03/12/2020 0032   CL 115 (H) 03/12/2020 0032   CO2 16 (L) 03/12/2020 0032   GLUCOSE 173 (H) 03/12/2020 0032   BUN 80 (H) 03/12/2020 0032   BUN 65 (H) 12/16/2018 1007   CREATININE 2.39 (H) 03/12/2020 0032   CALCIUM 8.6 (L) 03/12/2020 0032   PROT 5.7 (L) 03/12/2020 0032   ALBUMIN 2.3 (L) 03/12/2020 0032   AST 16 03/12/2020 0032   ALT 11 03/12/2020 0032   ALKPHOS 60 03/12/2020 0032   BILITOT 0.9 03/12/2020 0032   GFRNONAA 21 (L) 03/12/2020 0032   GFRAA 12 (L) 12/16/2018 1007   Lipase     Component Value Date/Time   LIPASE 40 01/09/2017 2041       Studies/Results: DG Abd Portable 1V  Result Date: 03/11/2020 CLINICAL DATA:  Follow up small bowel obstruction EXAM: PORTABLE  ABDOMEN - 1 VIEW COMPARISON:  03/10/2020 FINDINGS: Gastric catheter has been removed. Scattered large and small bowel gas is noted. Previously seen contrast is again noted within dilated loops of small bowel stable from the prior exam. No free air is noted. Degenerative changes of the right hip joint and lumbar spine are noted. IMPRESSION: Persistent small bowel dilatation. Electronically Signed   By: Inez Catalina M.D.   On: 03/11/2020 08:11   DG Abd Portable 1V-Small Bowel Obstruction Protocol-initial, 8 hr delay  Result Date: 03/10/2020 CLINICAL DATA:  Small bowel obstruction, 8 hour delay. EXAM: PORTABLE ABDOMEN - 1 VIEW COMPARISON:  Radiographs earlier today.  CT 03/08/2020 FINDINGS: Tip of the enteric tube in the right upper quadrant likely in the distal stomach or proximal duodenum. Persistent dilated small bowel in the central abdomen. Contrast opacifies small bowel loops. There is no contrast in the colon. Cholecystectomy clips in the right upper quadrant. Severe scoliotic curvature distorts normal anatomy. IMPRESSION: 1. Tip of the enteric tube in the distal stomach or proximal duodenum. 2. Persistent small bowel obstruction with administered contrast in dilated small bowel. No contrast in the colon. Electronically Signed   By: Keith Rake M.D.   On: 03/10/2020 19:12    Anti-infectives: Anti-infectives (  From admission, onward)   Start     Dose/Rate Route Frequency Ordered Stop   03/10/20 1200  vancomycin (VANCOREADY) IVPB 1000 mg/200 mL  Status:  Discontinued        1,000 mg 200 mL/hr over 60 Minutes Intravenous Every 48 hours 03/08/20 1317 03/09/20 1505   03/09/20 1300  ceFEPIme (MAXIPIME) 2 g in sodium chloride 0.9 % 100 mL IVPB        2 g 200 mL/hr over 30 Minutes Intravenous Every 24 hours 03/08/20 1312     03/08/20 1315  vancomycin (VANCOREADY) IVPB 1250 mg/250 mL        1,250 mg 166.7 mL/hr over 90 Minutes Intravenous  Once 03/08/20 1249 03/08/20 1604   03/08/20 1300  aztreonam  (AZACTAM) 2 g in sodium chloride 0.9 % 100 mL IVPB  Status:  Discontinued        2 g 200 mL/hr over 30 Minutes Intravenous  Once 03/08/20 1249 03/08/20 1259   03/08/20 1300  metroNIDAZOLE (FLAGYL) IVPB 500 mg        500 mg 100 mL/hr over 60 Minutes Intravenous  Once 03/08/20 1249 03/08/20 1554   03/08/20 1300  ceFEPIme (MAXIPIME) 2 g in sodium chloride 0.9 % 100 mL IVPB        2 g 200 mL/hr over 30 Minutes Intravenous  Once 03/08/20 1259 03/08/20 1411       Assessment/Plan Type 2 diabetes CAD Hx CHF COPD Obstructive sleep apnea with CPAP Hx CVA with hemiparesis, aphasia and dysphagia Stage IV CKD Hx chronic pain/long-term opiate use(oxycodone 10 mg; BID) Hx chronic constipation(02/14/16 Homebound Anemia Hx sacral wound decubitus with osteomyelitis-ID Dr. Karolee Ohs following IV antibiotics completed 01/18/2019 COVID + 02/27/20 Sepsis UTI - per medicine  SBO -3 BMs yesterday and overnight.  Minimal NGT output. -will DC NGT today -unclear or patient's swallowing function.  D/W primary service.  They are going to decide whether to start CLD vs get a swallow eval.  FEN: IV fluids/CLD ER:[id number] DVT: Lovenox   LOS: 4 days    Henreitta Cea , [facility] 03/12/2020, 10:49 AM Please see Amion for pager number during day hours 7:00am-4:30pm or 7:00am -11:30am on weekends

## 2020-03-12 NOTE — Progress Notes (Signed)
Pharmacy Antibiotic Note  Michelle Holmes is a 70 y.o. female admitted on 03/08/2020 with sepsis.  Pharmacy has been consulted for Vancomycin and Cefepime dosing. WBC elevated 13.6, LA 3.5, febrile with Tm 101.3. Scr 2.06 (recent baseline unknown), CrCl 20.4 ml/min.  Vancomycin stopped 2/12 after culture data back.  Urine growing ecoli and providencia stuartii.  Plan: Continue Cefepime 2gm IV Q24 hrs F/u LOT  Height: 5\' 2"  (157.5 cm) Weight: 60 kg (132 lb 4.4 oz) IBW/kg (Calculated) : 50.1  Temp (24hrs), Avg:98.1 F (36.7 C), Min:97.8 F (36.6 C), Max:98.3 F (36.8 C)  Recent Labs  Lab 03/08/20 1118 03/08/20 1239 03/09/20 0435 03/09/20 0807 03/09/20 1122 03/09/20 1509 03/10/20 0115 03/11/20 0603 03/11/20 1812 03/12/20 0032  WBC 13.6*  --  15.6*  --   --   --  13.3* 12.0*  --  10.5  CREATININE 2.06*  --  2.07*  --   --   --  2.13* 2.47* 2.49* 2.39*  LATICACIDVEN 3.5* 3.3*  --  2.4* 2.7* 2.6* 1.2  --   --   --     Estimated Creatinine Clearance: 17.6 mL/min (A) (by C-G formula based on SCr of 2.39 mg/dL (H)).    Allergies  Allergen Reactions  . Penicillins Hives and Swelling    ++ tolerates ceftriaxone++  Has patient had a PCN reaction causing immediate rash, facial/tongue/throat swelling, SOB or lightheadedness with hypotension: yes Has patient had a PCN reaction causing severe rash involving mucus membranes or skin necrosis: no Has patient had a PCN reaction that required hospitalization no Has patient had a PCN reaction occurring within the last 10 years: no If all of the above answers are "NO", then may proceed with Cephalosporin use.   . Sulfa Antibiotics Other (See Comments)    Breaks out    Antimicrobials this admission: Vancomycin 2/11 >> 2/12 Cefepime 2/11 >>  Flagyl 2/11 >> 2/12  Dose adjustments this admission: N/A  Microbiology results: 2/11 BCx: NGTD  2/11 UCx: >100k ecoli and providencia stuartii  Michelle Holmes, Michelle Holmes, BCPS, [facility] Clinical  Pharmacist  03/12/2020 7:53 AM   [facility] pharmacy phone numbers are listed on [email or web address]  Please check [email or web address] for unit-specific pharmacy phone numbers.

## 2020-03-12 NOTE — Progress Notes (Signed)
Brief Palliative Medicine Progress Note:  10:43 AM Called daughter/Denise and scheduled family meeting for tomorrow 2/16 at 11:30am.  Coordinated with primary RN for patient to have spiritual leader visitation today. Attempted to contact Chaplain to help with visit but visitors have already arrived to building. I appreciate RN assistance.  Thank you for allowing PMT to assist in the care of this patient.  Jayd Cadieux M. Tamala Julian [facility] Palliative Medicine Team Team Phone: [phone] NO CHARGE

## 2020-03-12 NOTE — Plan of Care (Signed)
  Problem: Elimination: Goal: Will not experience complications related to urinary retention Outcome: Progressing   Problem: Skin Integrity: Goal: Risk for impaired skin integrity will decrease Outcome: Progressing   Problem: Pain Managment: Goal: General experience of comfort will improve Outcome: Progressing

## 2020-03-12 NOTE — Progress Notes (Signed)
Subjective:   Hospital day: 4  Overnight event: No acute event   Patient evaluated at bedside this morning. She is laying in bed resting comfortably. Opens eyes to voice and intermittently answers yes and nor questions. Denies abdominal pain.   Objective:  Vital signs in last 24 hours: Vitals:   03/11/20 1900 03/11/20 2200 03/12/20 0000 03/12/20 0407  BP: 125/67 126/68 140/74 110/63  Pulse: 70 70 70 62  Resp: 17 14 17 17   Temp: 98.1 F (36.7 C)  98.3 F (36.8 C)   TempSrc: Oral  Oral   SpO2: 99% 95% 95% 97%  Weight:      Height:       CBC Latest Ref Rng & Units 03/12/2020 03/11/2020 03/10/2020  WBC 4.0 - 10.5 K/uL 10.5 12.0(H) 13.3(H)  Hemoglobin 12.0 - 15.0 g/dL 7.8(L) 7.6(L) 8.1(L)  Hematocrit 36.0 - 46.0 % 23.9(L) 23.7(L) 25.0(L)  Platelets 150 - 400 K/uL 180 167 143(L)   CMP Latest Ref Rng & Units 03/12/2020 03/11/2020 03/11/2020  Glucose 70 - 99 mg/dL 173(H) 128(H) 88  BUN 8 - 23 mg/dL 80(H) 84(H) 85(H)  Creatinine 0.44 - 1.00 mg/dL 2.39(H) 2.49(H) 2.47(H)  Sodium 135 - 145 mmol/L 143 141 144  Potassium 3.5 - 5.1 mmol/L 3.0(L) 3.5 3.3(L)  Chloride 98 - 111 mmol/L 115(H) 112(H) 114(H)  CO2 22 - 32 mmol/L 16(L) 15(L) 16(L)  Calcium 8.9 - 10.3 mg/dL 8.6(L) 8.7(L) 8.7(L)  Total Protein 6.5 - 8.1 g/dL 5.7(L) - 5.9(L)  Total Bilirubin 0.3 - 1.2 mg/dL 0.9 - 1.9(H)  Alkaline Phos 38 - 126 U/L 60 - 60  AST 15 - 41 U/L 16 - 19  ALT 0 - 44 U/L 11 - 12     Physical Exam  Physical Exam Constitutional:      Appearance: She is ill-appearing.     Comments: Somnolent but awake to verbal stimulation.   Eyes:     General:        Right eye: No discharge.        Left eye: No discharge.  Cardiovascular:     Rate and Rhythm: Normal rate and regular rhythm.  Pulmonary:     Effort: Pulmonary effort is normal. No respiratory distress.  Abdominal:     General: Bowel sounds are normal.     Tenderness: There is abdominal tenderness (Diffuse tenderness to palpation).  Skin:     General: Skin is warm.     Assessment/Plan: SusanDarkis a38 year old female with PMHx ofvia anemia, CHF, COPD, CAD, CVA with residual right hemiparesis,decubitus ulcer, chronic indwelling catheter,history of sacral osteomyelitis and recent Covid infection who presenting from home for altered mental status,found to have sepsis 2/2 UTI and a small bowel obstruction.  Active Problems:   Right hemiplegia (HCC)   Hemiparesis, aphasia, and dysphagia as late effect of cerebrovascular accident (CVA) ([place])   Chronic kidney disease (CKD), stage IV (severe) (HCC)   Sepsis (HCC)   Small bowel obstruction (HCC)   Pressure injury of skin  Altered mental status Sepsis2/2 UTI -Patient presenting with altered mental status in setting of sepsis secondary to UTI.  Urine cultures show out 100 K colonies of E. Coli and Providencia Stuartii.Blood cultures are negative to date.  Continue IV cefepime. -Her mental status improved slightly today however is nowhere near her baseline.  Blood pressure stable.  We will continue maintenance fluid D5 LR. -Palliative team will plan a family meeting tomorrow for goal of care discussion - Continue IV cefepime (day  5) - Follow-up blood cultures.  Negative to date - Continue maintenance fluids with D5 LR 100 cc/h - Appreciate palliative team recommendation   Small Bowel Obstruction Patient has had 3 bowel movements yesterday with no output from NG tube.  NG tube removed today per surgery.  Due to the mental status, will not proceed with swallow study because patient cannot follow command. - Continue n.p.o. - Continue maintenance fluids   CKD Serum creatinine trending down from 2.47 to 2.39.  Continue maintenance fluids with D5. - Monitor BMP   Hypertension Currently normotensive - Holding amlodipine in the setting of sepsis   Hyperlipidemia - Holding atorvastatin due to SBO   Adrenal adenoma - Found on CT.can follow-up  outpatient   CODE STATUS: DNR DVT: Lovenox IV fluid: D5 100cc/h Diet: N.p.o.   Prior to Admission Living Arrangement: Home Anticipated Discharge Location: To be determined Barriers to Discharge: Continue medical work-up  Gaylan Gerold, DO 03/12/2020, 6:15 AM Pager: [phone] After 5pm on weekdays and 1pm on weekends: On Call pager [phone]

## 2020-03-13 ENCOUNTER — Telehealth: Payer: Self-pay

## 2020-03-13 DIAGNOSIS — K56609 Unspecified intestinal obstruction, unspecified as to partial versus complete obstruction: Secondary | ICD-10-CM | POA: Diagnosis not present

## 2020-03-13 DIAGNOSIS — R6520 Severe sepsis without septic shock: Secondary | ICD-10-CM | POA: Diagnosis not present

## 2020-03-13 DIAGNOSIS — A419 Sepsis, unspecified organism: Secondary | ICD-10-CM | POA: Diagnosis not present

## 2020-03-13 DIAGNOSIS — N184 Chronic kidney disease, stage 4 (severe): Secondary | ICD-10-CM | POA: Diagnosis not present

## 2020-03-13 DIAGNOSIS — G9340 Encephalopathy, unspecified: Secondary | ICD-10-CM | POA: Diagnosis not present

## 2020-03-13 DIAGNOSIS — I69391 Dysphagia following cerebral infarction: Secondary | ICD-10-CM | POA: Diagnosis not present

## 2020-03-13 LAB — COMPREHENSIVE METABOLIC PANEL
ALT: 12 U/L (ref 0–44)
AST: 18 U/L (ref 15–41)
Albumin: 2.3 g/dL — ABNORMAL LOW (ref 3.5–5.0)
Alkaline Phosphatase: 53 U/L (ref 38–126)
Anion gap: 11 (ref 5–15)
BUN: 82 mg/dL — ABNORMAL HIGH (ref 8–23)
CO2: 17 mmol/L — ABNORMAL LOW (ref 22–32)
Calcium: 8.7 mg/dL — ABNORMAL LOW (ref 8.9–10.3)
Chloride: 116 mmol/L — ABNORMAL HIGH (ref 98–111)
Creatinine, Ser: 2.27 mg/dL — ABNORMAL HIGH (ref 0.44–1.00)
GFR, Estimated: 23 mL/min — ABNORMAL LOW (ref >60)
Glucose, Bld: 145 mg/dL — ABNORMAL HIGH (ref 70–99)
Potassium: 3.2 mmol/L — ABNORMAL LOW (ref 3.5–5.1)
Sodium: 144 mmol/L (ref 135–145)
Total Bilirubin: 1.1 mg/dL (ref 0.3–1.2)
Total Protein: 5.9 g/dL — ABNORMAL LOW (ref 6.5–8.1)

## 2020-03-13 LAB — CULTURE, BLOOD (ROUTINE X 2)
Culture: NO GROWTH
Culture: NO GROWTH

## 2020-03-13 LAB — CBC
HCT: 24.7 % — ABNORMAL LOW (ref 36.0–46.0)
Hemoglobin: 8 g/dL — ABNORMAL LOW (ref 12.0–15.0)
MCH: 29.9 pg (ref 26.0–34.0)
MCHC: 32.4 g/dL (ref 30.0–36.0)
MCV: 92.2 fL (ref 80.0–100.0)
Platelets: 202 10*3/uL (ref 150–400)
RBC: 2.68 MIL/uL — ABNORMAL LOW (ref 3.87–5.11)
RDW: 17.2 % — ABNORMAL HIGH (ref 11.5–15.5)
WBC: 8 10*3/uL (ref 4.0–10.5)
nRBC: 0 % (ref 0.0–0.2)

## 2020-03-13 MED ORDER — ONDANSETRON HCL 4 MG/2ML IJ SOLN: 4.0000 mg | Freq: Four times a day (QID) | INTRAMUSCULAR | Status: DC | PRN

## 2020-03-13 MED ORDER — ACETAMINOPHEN 325 MG PO TABS: 650.0000 mg | ORAL_TABLET | Freq: Four times a day (QID) | ORAL | Status: DC | PRN

## 2020-03-13 MED ORDER — ACETAMINOPHEN 650 MG RE SUPP: 650.0000 mg | Freq: Four times a day (QID) | RECTAL | Status: DC | PRN

## 2020-03-13 MED ORDER — LORAZEPAM 1 MG PO TABS
1.0000 mg | ORAL_TABLET | ORAL | Status: DC | PRN
Start: 2020-03-13 — End: 2020-03-14

## 2020-03-13 MED ORDER — DEXTROSE IN LACTATED RINGERS 5 % IV SOLN: INTRAVENOUS | Status: DC

## 2020-03-13 MED ORDER — LORAZEPAM 2 MG/ML IJ SOLN
1.0000 mg | INTRAMUSCULAR | Status: DC | PRN
Start: 2020-03-13 — End: 2020-03-14

## 2020-03-13 MED ORDER — GLYCOPYRROLATE 0.2 MG/ML IJ SOLN: 0.2000 mg | INTRAMUSCULAR | Status: DC | PRN

## 2020-03-13 MED ORDER — BIOTENE DRY MOUTH MT LIQD: 15.0000 mL | OROMUCOSAL | Status: DC | PRN

## 2020-03-13 MED ORDER — OXYCODONE HCL 20 MG/ML PO CONC
5.0000 mg | ORAL | Status: DC
  Administered 2020-03-13 – 2020-03-14 (×6): 5 mg via ORAL
  Filled 2020-03-13 (×5): qty 1

## 2020-03-13 MED ORDER — OXYCODONE HCL 20 MG/ML PO CONC: 5.0000 mg | ORAL | Status: DC

## 2020-03-13 MED ORDER — ONDANSETRON 4 MG PO TBDP: 4.0000 mg | ORAL_TABLET | Freq: Four times a day (QID) | ORAL | Status: DC | PRN

## 2020-03-13 MED ORDER — OXYCODONE HCL 5 MG/5ML PO SOLN: 5.0000 mg | ORAL | Status: DC

## 2020-03-13 MED ORDER — POLYVINYL ALCOHOL 1.4 % OP SOLN: 1.0000 [drp] | Freq: Four times a day (QID) | OPHTHALMIC | Status: DC | PRN

## 2020-03-13 MED ORDER — POTASSIUM CHLORIDE 10 MEQ/100ML IV SOLN
10.0000 meq | INTRAVENOUS | Status: AC
  Administered 2020-03-13 (×4): 10 meq via INTRAVENOUS
  Filled 2020-03-13 (×4): qty 100

## 2020-03-13 MED ORDER — LORAZEPAM 2 MG/ML PO CONC
1.0000 mg | ORAL | Status: DC | PRN
Start: 2020-03-13 — End: 2020-03-14

## 2020-03-13 NOTE — Plan of Care (Signed)
  Problem: Elimination: Goal: Will not experience complications related to urinary retention Outcome: Progressing   Problem: Pain Managment: Goal: General experience of comfort will improve Outcome: Progressing   Problem: Skin Integrity: Goal: Risk for impaired skin integrity will decrease Outcome: Progressing   

## 2020-03-13 NOTE — Progress Notes (Addendum)
Manufacturing engineer (ACC)  **Addendum: DME has been delivered. Chart has been reviewed by Dr. Gildardo Cranker, [facility] MD; pt is deemed hospice eligible. Please use TOC pharmacy tomorrow for comfort meds before discharge.  Please consider alternate route, ie transdermal or PR. ACC will plan for same day admit.**   Received request from [facility] for hospice services at home after discharge.  Chart and pt information under review by [facility] physician.  Hospice eligibility pending at this time.  Hospital liaison spoke with pt's daughter [name] to initiate education related to hospice philosophy and services and to answer any questions at this time.  [name] verbalized understanding of information given.  Per discussion the plan is to discharge home today by private vehicle.    Pease send signed and completed DNR home with pt/family.  Please provide prescriptions at discharge as needed to ensure ongoing symptom management until patient can be admitted onto hospice services.    DME needs discussed.  Suction ordered.  [name] denies needing hospital bed or other DME.  Address has been verified and is correct in the chart.  ACC information and contact numbers given to daughter [name].  Above information shared with Alden Hipp Manager.  Please call with any questions or concerns.  Thank you for the opportunity to participate in this pt's care.  Domenic Moras, BSN, RN [employer] [phone] (24h on call)

## 2020-03-13 NOTE — Progress Notes (Signed)
5 mg (0.12mL) of oxycodone given to patient. 15 mg (0.24mL) wasted in stericycle. Witnessed by Sylvan Cheese, RN.

## 2020-03-13 NOTE — Progress Notes (Signed)
Subjective/Chief Complaint: Pt with no abd pain 2700cc out of NGT   Objective: Vital signs in last 24 hours: Temp:  [97.6 F (36.4 C)-98.7 F (37.1 C)] 97.6 F (36.4 C) (02/16 0324) Pulse Rate:  [77-81] 78 (02/16 0324) Resp:  [18-22] 18 (02/16 0324) BP: (129-143)/(63-85) 129/63 (02/16 0324) SpO2:  [97 %-99 %] 99 % (02/16 0324) Last BM Date: 03/12/20  Intake/Output from previous day: 02/15 0701 - 02/16 0700 In: 882.1 [I.V.:782; IV Piggyback:100.1] Out: 3175 [Urine:375; Emesis/NG output:2800] Intake/Output this shift: No intake/output data recorded.  PE:  Constitutional: No acute distress, conversant, appears states age. Eyes: Anicteric sclerae, moist conjunctiva, no lid lag Lungs: Clear to auscultation bilaterally, normal respiratory effort CV: regular rate and rhythm, no murmurs, no peripheral edema, pedal pulses 2+ GI: Soft, no masses or hepatosplenomegaly, non-tender to palpation, nondistended Skin: No rashes, palpation reveals normal turgor Psychiatric: appropriate judgment and insight, oriented to person, place, and time   Lab Results:  Recent Labs    03/12/20 0032 03/13/20 0033  WBC 10.5 8.0  HGB 7.8* 8.0*  HCT 23.9* 24.7*  PLT 180 202   BMET Recent Labs    03/12/20 0032 03/13/20 0033  NA 143 144  K 3.0* 3.2*  CL 115* 116*  CO2 16* 17*  GLUCOSE 173* 145*  BUN 80* 82*  CREATININE 2.39* 2.27*  CALCIUM 8.6* 8.7*   PT/INR No results for input(s): LABPROT, INR in the last 72 hours. ABG No results for input(s): PHART, HCO3 in the last 72 hours.  Invalid input(s): PCO2, PO2  Studies/Results: DG Abd Portable 1V  Result Date: 03/12/2020 CLINICAL DATA:  Vomiting EXAM: PORTABLE ABDOMEN - 1 VIEW COMPARISON:  03/11/2020 FINDINGS: Supine frontal view of the abdomen and pelvis is obtained, excluding the right upper flank by collimation. Evaluation limited by patient condition and positioning. There is marked gaseous distention of the stomach, with  increasing gaseous distention of a loop of bowel within the left lower quadrant. There are no masses or abnormal calcifications. No evidence of pneumoperitoneum on this limited supine exam. IMPRESSION: 1. Progressive gaseous distention of the stomach and distal bowel, consistent with progressive obstruction. Electronically Signed   By: Randa Ngo M.D.   On: 03/12/2020 16:45    Anti-infectives: Anti-infectives (From admission, onward)   Start     Dose/Rate Route Frequency Ordered Stop   03/10/20 1200  vancomycin (VANCOREADY) IVPB 1000 mg/200 mL  Status:  Discontinued        1,000 mg 200 mL/hr over 60 Minutes Intravenous Every 48 hours 03/08/20 1317 03/09/20 1505   03/09/20 1300  ceFEPIme (MAXIPIME) 2 g in sodium chloride 0.9 % 100 mL IVPB        2 g 200 mL/hr over 30 Minutes Intravenous Every 24 hours 03/08/20 1312     03/08/20 1315  vancomycin (VANCOREADY) IVPB 1250 mg/250 mL        1,250 mg 166.7 mL/hr over 90 Minutes Intravenous  Once 03/08/20 1249 03/08/20 1604   03/08/20 1300  aztreonam (AZACTAM) 2 g in sodium chloride 0.9 % 100 mL IVPB  Status:  Discontinued        2 g 200 mL/hr over 30 Minutes Intravenous  Once 03/08/20 1249 03/08/20 1259   03/08/20 1300  metroNIDAZOLE (FLAGYL) IVPB 500 mg        500 mg 100 mL/hr over 60 Minutes Intravenous  Once 03/08/20 1249 03/08/20 1554   03/08/20 1300  ceFEPIme (MAXIPIME) 2 g in sodium chloride 0.9 % 100 mL  IVPB        2 g 200 mL/hr over 30 Minutes Intravenous  Once 03/08/20 1259 03/08/20 1411      Assessment/Plan: Type 2 diabetes CAD Hx CHF COPD Obstructive sleep apnea with CPAP Hx CVA with hemiparesis, aphasia and dysphagia Stage IV CKD Hx chronic pain/long-term opiate use(oxycodone 10 mg; BID) Hx chronic constipation(02/14/16 Homebound Anemia Hx sacral wound decubitus with osteomyelitis-ID Dr. Karolee Ohs following IV antibiotics completed 01/18/2019 COVID + 02/27/20 Sepsis UTI - per medicine  SBO -Pt con't with  BMs -NGT output still high -Pt is a very high risk surgical patient. Patient is not improving at needs NGT decompression.  Pt con't with BMs so may have partial SBO.  Palliative care meeting with family today.  Pt may benefit from palliative Gtube for decompression.  Following FEN: IV fluids/CLD UU:[id number] DVT: Lovenox   LOS: 5 days    Ralene Ok 03/13/2020

## 2020-03-13 NOTE — Telephone Encounter (Signed)
Received TC from [facility] with [facility] who states they received an urgent request for at home hospice care for this patient who is imminent. States patient is to be discharged today. Wants to know if Dr. Alfonse Spruce will act as PCP while patient is on hospice care.  Dr. Alfonse Spruce is on in-patient rotation.  Will forward to Dr. Alfonse Spruce and will also forward to attendings.  Please advise. Thank you, SChaplin, RN,BSN

## 2020-03-13 NOTE — Telephone Encounter (Signed)
I am comfortable with acting as patient's PCP while on hospice care. Thank you

## 2020-03-13 NOTE — Telephone Encounter (Signed)
I am the attending for this patient currently and would be happy to act as PCP while on hospice, or we can leave it as Dr. Alfonse Spruce if he is comfortable.

## 2020-03-13 NOTE — Telephone Encounter (Signed)
Thank you Dr. Rebeca Alert,  TC to [facility], spoke with Horris Latino, Dr. Rebeca Alert given for attending while patient on home hospice.  Informed Horris Latino that Dr. Alfonse Spruce is on the in-patient rotation and this nurse has not heard back from him yet.   SChaplin, RN,BSN

## 2020-03-13 NOTE — Progress Notes (Signed)
HD#5 Subjective:  Overnight Events: No acute  Awake this morning more interactive. Appears comfortable laying in bed.  She is able to answers yes an no questions.   Objective:  Vital signs in last 24 hours: Vitals:   03/12/20 1043 03/12/20 1912 03/12/20 2326 03/13/20 0324  BP: (!) 143/85 129/74 136/72 129/63  Pulse: 81 80 77 78  Resp: (!) 22 20 19 18   Temp: 98.1 F (36.7 C) 98.7 F (37.1 C) 98.3 F (36.8 C) 97.6 F (36.4 C)  TempSrc: Axillary Axillary Axillary Axillary  SpO2: 99% 98% 97% 99%  Weight:      Height:       Supplemental O2: Room Air SpO2: 99 %   Physical Exam:  Physical Exam Constitutional:      Appearance: She is ill-appearing.  HENT:     Head: Normocephalic.  Eyes:     General:        Right eye: No discharge.        Left eye: No discharge.  Cardiovascular:     Rate and Rhythm: Normal rate and regular rhythm.  Pulmonary:     Effort: Pulmonary effort is normal. No respiratory distress.  Abdominal:     Comments: Few bowel sound noted  Musculoskeletal:     Right lower leg: Edema (Trace) present.     Left lower leg: Edema (+1) present.     Comments: Left arm edematous  Skin:    General: Skin is warm.     Filed Weights   03/08/20 1300  Weight: 60 kg     Intake/Output Summary (Last 24 hours) at 03/13/2020 0652 Last data filed at 03/13/2020 0543 Gross per 24 hour  Intake 1270.57 ml  Output 3175 ml  Net -1904.43 ml   Net IO Since Admission: 2,730.29 mL [03/13/20 0652]  Pertinent Labs: CBC Latest Ref Rng & Units 03/13/2020 03/12/2020 03/11/2020  WBC 4.0 - 10.5 K/uL 8.0 10.5 12.0(H)  Hemoglobin 12.0 - 15.0 g/dL 8.0(L) 7.8(L) 7.6(L)  Hematocrit 36.0 - 46.0 % 24.7(L) 23.9(L) 23.7(L)  Platelets 150 - 400 K/uL 202 180 167    CMP Latest Ref Rng & Units 03/13/2020 03/12/2020 03/11/2020  Glucose 70 - 99 mg/dL 145(H) 173(H) 128(H)  BUN 8 - 23 mg/dL 82(H) 80(H) 84(H)  Creatinine 0.44 - 1.00 mg/dL 2.27(H) 2.39(H) 2.49(H)  Sodium 135 - 145 mmol/L  144 143 141  Potassium 3.5 - 5.1 mmol/L 3.2(L) 3.0(L) 3.5  Chloride 98 - 111 mmol/L 116(H) 115(H) 112(H)  CO2 22 - 32 mmol/L 17(L) 16(L) 15(L)  Calcium 8.9 - 10.3 mg/dL 8.7(L) 8.6(L) 8.7(L)  Total Protein 6.5 - 8.1 g/dL 5.9(L) 5.7(L) -  Total Bilirubin 0.3 - 1.2 mg/dL 1.1 0.9 -  Alkaline Phos 38 - 126 U/L 53 60 -  AST 15 - 41 U/L 18 16 -  ALT 0 - 44 U/L 12 11 -    Imaging: DG Abd Portable 1V  Result Date: 03/12/2020 CLINICAL DATA:  Vomiting EXAM: PORTABLE ABDOMEN - 1 VIEW COMPARISON:  03/11/2020 FINDINGS: Supine frontal view of the abdomen and pelvis is obtained, excluding the right upper flank by collimation. Evaluation limited by patient condition and positioning. There is marked gaseous distention of the stomach, with increasing gaseous distention of a loop of bowel within the left lower quadrant. There are no masses or abnormal calcifications. No evidence of pneumoperitoneum on this limited supine exam. IMPRESSION: 1. Progressive gaseous distention of the stomach and distal bowel, consistent with progressive obstruction. Electronically Signed  By: Randa Ngo M.D.   On: 03/12/2020 16:45    Assessment/Plan:   Active Problems:   Right hemiplegia (HCC)   Hemiparesis, aphasia, and dysphagia as late effect of cerebrovascular accident (CVA) ([place])   Chronic kidney disease (CKD), stage IV (severe) (HCC)   Sepsis (HCC)   Small bowel obstruction (HCC)   Pressure injury of skin   Patient Summary: Michelle Holmes a85 year old female with PMHx ofvia anemia, CHF, COPD, CAD, CVA with residual right hemiparesis,decubitus ulcer, chronic indwelling catheter,history of sacral osteomyelitis and recent Covid infection who presenting from home for altered mental status,found to have sepsis2/2 UTIandasmall bowel obstruction.  Family decided to proceed with comfort care.   Altered mental status Sepsis2/2 UTI Small bowel obstruction Mental status improved slightly but still not near her  baseline.  NG tube was removed yesterday due to lack of output.  However patient had an episode of large emesis which we had to reinsert the NG tube.  Repeat KUB yesterday shows persistent obstruction with dilated stomach. Palliative team had a conversation with family today about goals of care.  They ultimately decided to proceed with comfort care and home hospice.  Patient will go home with NG tube for symptomatic management. -Appreciate palliative team recommendation -Discontinued antibiotic  -Pain medication per palliative team -Patient will go home with NG tube suction   CODE STATUS:DNR DVT: Lovenox IV fluid:D5 100cc/h Diet: N.p.o.  Dispo: Anticipated discharge to Home with home hospice in 1 day.   Gaylan Gerold, DO 03/13/2020, 6:52 AM Pager: [phone]  Please contact the on call pager after 5 pm and on weekends at [phone].

## 2020-03-13 NOTE — Progress Notes (Signed)
Daily Progress Note   Patient Name: Michelle Holmes       Date: 03/13/2020 DOB: 10-11-1950  Age: 70 y.o. MRN#: [id number] Attending Physician: Oda Kilts, MD Primary Care Physician: Gaylan Gerold, DO Admit Date: 03/08/2020  Reason for Follow-up: Family meeting  Subjective: Per nursing, patient had vomiting after NG tube was removed yesterday. When NG tube was re-inserted, there was immediately 2000 ml of output.   I met with multiple family members in the [facility] consultation room. Reviewed with them at length the patient's hospital course and current medical condition. Discussed that she has multiple co-morbid conditions including heart failure and kidney disease. Also discussed her poor functional status at baseline, emphasizing she is bed-bound and frail.  Ultimately discussed that patient has not improved despite current medical interventions. Gently discussed that patient appears to be suffering. I introduced the concept of a comfort path to family and recommended transitioning the focus of care to comfort and dignity rather than prolonging life. Introduced hospice philosophy and provided information on home vs residential hospice services.    Discussed transitioning to comfort care while in the hospital, and what that would look like--keeping her clean and dry, no labs, no artificial hydration or feeding, no antibiotics, minimizing of medications, comfort feeds (if appropriate), medication for pain and other symptoms as needed.   One family member expresses concern regarding stopping IV hydration. I provided education regarding expectations at EOL and explained that artificial hydration can prolong the process. Also discussed the benefits of not using artifical hydration at EOL including less  fluid around the lungs, less fluid retained in the extremities, and that natural pain-relieving chemicals are released as the body dehydrates.    Family collectively expresses they agree with transition to comfort care and don't want patient to suffer any longer. They want patient to be at home with hospice care. Discussed need to keep NG tube in place for comfort.   After the meeting, plan of care was discussed with Dr. Alfonse Spruce, primary RN, [facility] case manager, and hospice liaison.   Length of Stay: 5  Current Medications: Scheduled Meds:  . mouth rinse  15 mL Mouth Rinse BID  . oxyCODONE  5 mg Oral Q4H  . sodium chloride flush  3 mL Intravenous Q12H    Continuous Infusions:   PRN  Meds: acetaminophen **OR** acetaminophen, antiseptic oral rinse, glycopyrrolate **OR** glycopyrrolate, HYDROmorphone (DILAUDID) injection, LORazepam **OR** LORazepam **OR** LORazepam, ondansetron **OR** ondansetron (ZOFRAN) IV, polyvinyl alcohol  Physical Exam Constitutional:      Appearance: She is ill-appearing.  Pulmonary:     Effort: Pulmonary effort is normal.  Neurological:     Motor: Weakness present.             Vital Signs: BP 129/71 (BP Location: Left Arm)   Pulse 76   Temp 98 F (36.7 C) (Oral)   Resp 19   Ht 5' 2"  (1.575 m)   Wt 60 kg   SpO2 99%   BMI 24.19 kg/m  SpO2: SpO2: 99 % O2 Device: O2 Device: Room Air O2 Flow Rate:    Intake/output summary:   Intake/Output Summary (Last 24 hours) at 03/13/2020 1253 Last data filed at 03/13/2020 0543 Gross per 24 hour  Intake 882.1 ml  Output 3175 ml  Net -2292.9 ml   LBM: Last BM Date: 03/12/20 Baseline Weight: Weight: 60 kg Most recent weight: Weight: 60 kg       Palliative Assessment/Data: PPS 10%      Palliative Care Assessment & Plan   HPI/Patient Profile:70 y.o.femalewith past medical history of CVA with residual right hemiparesis, chronic indwelling catheter, decubitus ulcer,CKD stage IV-V,CHF, COPD, CAD, anemia,  osteomyelitis, and recent Covid infection. She presented to the emergency department on2/11/2022with altered mental status. ED Course: CT scan showed distal high grade small bowel obstruction, moderate anasarca with mild ascites, and right adrenal adenoma. Lactic acid 3.5, trending down.WBC 13.6, urinalysis with many bacteria. Creatinine 2.06, at baseline. Patient admitted to Internal Medicine TS with sepsis likely secondary to UTI and small bowel obstruction.  Assessment: - sepsis secondary to UTI - partial SBO - altered mental status - CKD stage IV-V - hx of CVA with residual right hemiplegia, aphasia - protein calorie malnutrition - end of life care  Recommendations/Plan: Full comfort measures initiated DNR/DNI as previously documented Plan for discharge home with hospice ([facility]) - TOC consult placed; TOC and hospice liaison notifed Patient will need to keep NG tube at home for comfort Added orders for symptom management at EOL as well as discontinued orders that were not focused on comfort Unrestricted visitation orders were placed per current [facility] EOL visitation policy  Provide frequent assessments and administer PRN medications as clinically necessary to ensure EOL comfort PMT will continue to follow holistically  Symptom Management:  Oxycodone (ROXYCODONE) solution 5 mg every 4 hours Hydromorphone (DILAUDID) 0.5 mg every 4 hours prn Lorazepam (ATIVAN) prn for anxiety  Glycopyrrolate (ROBINUL) for excessive secretions Ondansetron (ZOFRAN) prn for nausea Polyvinyl alcohol (LIQUIFILM TEARS) prn for dry eyes Antiseptic oral rinse (BIOTENE) prn for dry mouth   Goals of Care and Additional Recommendations: Limitations on Scope of Treatment: Full Comfort Care  Code Status: DNR/DNI  Prognosis:  < 2 weeks  Discharge Planning: Home with Hospice   Thank you for allowing the Palliative Medicine Team to assist in the care of this patient.   Total  Time 67 minutes Prolonged Time Billed  yes       Greater than 50%  of this time was spent counseling and coordinating care related to the above assessment and plan.  Lavena Bullion, NP  Please contact Palliative Medicine Team phone at [phone] for questions and concerns.

## 2020-03-13 NOTE — Progress Notes (Signed)
Nutrition Brief Note  RD drawn to patient due to NPO x 5 days. Chart reviewed. Pt now transitioning to comfort care.  No nutrition interventions warranted at this time.  Please consult as needed.    Gustavus Bryant, MS, RD, LDN Inpatient Clinical Dietitian Please see AMiON for contact information.

## 2020-03-13 NOTE — TOC Progression Note (Addendum)
Transition of Care [facility]) - Progression Note    Patient Details  Name: Michelle Holmes MRN: [id number] Date of Birth: 06-05-1950  [facility] CM/SW Contact  Zenon Mayo, RN Phone Number: 03/13/2020, 12:40 PM  Clinical Narrative:    NCM received referral for home hospice with [facility] from palliative services.  NCM spoke with daughter, [name], she states they want to take her home with the ng to suction and she knows the care it entails.  She has a therapeutic bed at home that massages her, she has a bedside table, bsc,  And bed pads.  NCM made referral to [place] with [facility] for home hospice.  Patient will need ambulance transport at dc. Address confirmed. [facility] will check with her MD to make sure they can take referral.        Expected Discharge Plan and Services                                                 Social Determinants of Health (SDOH) Interventions    Readmission Risk Interventions No flowsheet data found.

## 2020-03-14 ENCOUNTER — Other Ambulatory Visit: Payer: Self-pay | Admitting: Student

## 2020-03-14 DIAGNOSIS — N184 Chronic kidney disease, stage 4 (severe): Secondary | ICD-10-CM | POA: Diagnosis not present

## 2020-03-14 DIAGNOSIS — G8191 Hemiplegia, unspecified affecting right dominant side: Secondary | ICD-10-CM

## 2020-03-14 DIAGNOSIS — A419 Sepsis, unspecified organism: Secondary | ICD-10-CM | POA: Diagnosis not present

## 2020-03-14 DIAGNOSIS — K56609 Unspecified intestinal obstruction, unspecified as to partial versus complete obstruction: Secondary | ICD-10-CM | POA: Diagnosis not present

## 2020-03-14 DIAGNOSIS — N390 Urinary tract infection, site not specified: Secondary | ICD-10-CM

## 2020-03-14 DIAGNOSIS — N189 Chronic kidney disease, unspecified: Secondary | ICD-10-CM

## 2020-03-14 MED ORDER — OXYCODONE HCL 20 MG/ML PO CONC: 5.0000 mg | ORAL | 0 refills | Status: DC

## 2020-03-14 MED ORDER — MORPHINE SULFATE 20 MG/5ML PO SOLN: 5.0000 mg | ORAL | 0 refills | Status: AC | PRN

## 2020-03-14 MED ORDER — LORAZEPAM 2 MG/ML PO CONC: 1.0000 mg | ORAL | 0 refills | Status: DC | PRN

## 2020-03-14 MED ORDER — ACETAMINOPHEN 650 MG RE SUPP: 650.0000 mg | Freq: Four times a day (QID) | RECTAL | 0 refills | Status: AC | PRN

## 2020-03-14 MED ORDER — MORPHINE SULFATE 20 MG/5ML PO SOLN
5.0000 mg | ORAL | 0 refills | Status: DC | PRN
Start: 2020-03-14 — End: 2020-03-14

## 2020-03-14 MED ORDER — ONDANSETRON 4 MG PO TBDP: 4.0000 mg | ORAL_TABLET | Freq: Four times a day (QID) | ORAL | 0 refills | Status: AC | PRN

## 2020-03-14 MED FILL — ONDANSETRON ODT 4 MG TABLET: 4 | 5 days supply | Qty: 20 | Fill #0

## 2020-03-14 MED FILL — ACETAMINOPHEN 650 MG SUPP: 650 | 12 days supply | Qty: 50 | Fill #0

## 2020-03-14 MED FILL — LORAZEPAM INTENSOL 2 MG/ML: 2 | 10 days supply | Qty: 30 | Fill #0

## 2020-03-14 MED FILL — MORPHINE SULF 100 MG/5 ML S: 100 | 30 days supply | Qty: 30 | Fill #0

## 2020-03-14 NOTE — TOC Progression Note (Signed)
Transition of Care [facility]) - Progression Note    Patient Details  Name: Michelle Holmes MRN: [id number] Date of Birth: Apr 03, 1950  Transition of Care [facility]) CM/SW Contact  Zenon Mayo, RN Phone Number: 03/14/2020, 10:21 AM  Clinical Narrative:    Patient will be for dc today, the ng suction DME is at the patient's home, per daughter [name] she states she will come to hopital to get education on the suction DME. She states she will need ambulance today, address confirmed.  Awaiting dc order.        Expected Discharge Plan and Services                                                 Social Determinants of Health (SDOH) Interventions    Readmission Risk Interventions No flowsheet data found.

## 2020-03-14 NOTE — Progress Notes (Signed)
Daily Progress Note   Patient Name: Michelle Holmes       Date: 03/14/2020 DOB: 13-Nov-1950  Age: 70 y.o. MRN#: [id number] Attending Physician: Oda Kilts, MD Primary Care Physician: Gaylan Gerold, DO Admit Date: 03/08/2020  Reason for Follow-up: symptom check, end of life care  Subjective: Patient appears comfortable. When I asked her if she is in pain, she shakes her head no. Respirations are even and unlabored. She is pending discharge home today with hospice.   I discussed discharge plan with Dr. Alfonse Spruce and have recommended the oxycodone concentrate solution be continued at discharge, as it absorbs quickly into the oral mucosa therefore can be given with NG tube in pace.   Length of Stay: 6  Current Medications: Scheduled Meds:  . mouth rinse  15 mL Mouth Rinse BID  . oxyCODONE  5 mg Oral Q4H  . sodium chloride flush  3 mL Intravenous Q12H     PRN Meds: acetaminophen **OR** acetaminophen, antiseptic oral rinse, glycopyrrolate **OR** glycopyrrolate, HYDROmorphone (DILAUDID) injection, LORazepam **OR** LORazepam **OR** LORazepam, ondansetron **OR** ondansetron (ZOFRAN) IV, polyvinyl alcohol          Vital Signs: BP 134/84 (BP Location: Right Wrist)   Pulse 87   Temp 99.6 F (37.6 C) (Oral)   Resp (!) 21   Ht 5\' 2"  (1.575 m)   Wt 60 kg   SpO2 98%   BMI 24.19 kg/m  SpO2: SpO2: 98 % O2 Device: O2 Device: Room Air O2 Flow Rate:    Intake/output summary:   Intake/Output Summary (Last 24 hours) at 03/14/2020 1228 Last data filed at 03/14/2020 1048 Gross per 24 hour  Intake 3 ml  Output 1575 ml  Net -1572 ml   LBM: Last BM Date: 03/14/20 Baseline Weight: Weight: 60 kg Most recent weight: Weight: 60 kg       Palliative Assessment/Data: PPS 10%          Palliative Care Assessment & Plan   HPI/Patient Profile:70 y.o.femalewith past medical history of CVA with residual right hemiparesis, chronic indwelling catheter, decubitus ulcer,CKD stage IV-V,CHF, COPD, CAD, anemia, osteomyelitis, and recent Covid infection. She presented to the emergency department on2/11/2022with altered mental status. ED Course: CT scan showed distal high grade small bowel obstruction, moderate anasarca with mild ascites, and right adrenal adenoma. Lactic acid 3.5,  trending down.WBC 13.6, urinalysis with many bacteria. Creatinine 2.06, at baseline. Patient admitted to Internal Medicine TS with sepsis likely secondary to UTI and small bowel obstruction.  Assessment: - sepsis secondary to UTI - partial SBO - altered mental status - CKD stage IV-V - hx of CVA with residual right hemiplegia, aphasia - protein calorie malnutrition - end of life care  Recommendations/Plan: DNR/DNI as previously documented Continue full comfort care Discharge home with hospice Continue NG tube at home for comfort Continue oxycodone concentrate solution 5 mg every 4 hours   Prognosis:  less than 2 weeks  Discharge Planning: Home with hospice   Thank you for allowing the Palliative Medicine Team to assist in the care of this patient.   Total Time 15 minutes Prolonged Time Billed  no       Greater than 50%  of this time was spent counseling and coordinating care related to the above assessment and plan.  Lavena Bullion, NP  Please contact Palliative Medicine Team phone at [phone] for questions and concerns.

## 2020-03-14 NOTE — Progress Notes (Signed)
RN went over pt discharge information with pt's daughter. Pt discharge packet given to PTAR. PTAR to transport pt home. Tech removed IV.Pt belongings sent with PTAR.

## 2020-03-14 NOTE — Discharge Summary (Addendum)
Name: Michelle Holmes MRN: [id number] DOB: 1950-04-15 70 y.o. PCP: Gaylan Gerold, DO  Date of Admission: 03/08/2020 10:13 AM Date of Discharge: 03/14/2020 Attending Physician: Oda Kilts, MD  Discharge Diagnosis: Altered mental status Sepsis secondary to UTI Small bowel obstruction  Chronic kidney disease History of CVA  Discharge Medications: Allergies as of 03/14/2020      Reactions   Penicillins Hives, Swelling   ++ tolerates ceftriaxone++ Has patient had a PCN reaction causing immediate rash, facial/tongue/throat swelling, SOB or lightheadedness with hypotension: yes Has patient had a PCN reaction causing severe rash involving mucus membranes or skin necrosis: no Has patient had a PCN reaction that required hospitalization no Has patient had a PCN reaction occurring within the last 10 years: no If all of the above answers are "NO", then may proceed with Cephalosporin use.   Sulfa Antibiotics Other (See Comments)   Breaks out      Medication List    STOP taking these medications   acetaminophen 500 MG tablet Commonly known as: TYLENOL Replaced by: acetaminophen 650 MG suppository   amLODipine 10 MG tablet Commonly known as: NORVASC   ascorbic acid 1000 MG tablet Commonly known as: VITAMIN C   atorvastatin 20 MG tablet Commonly known as: LIPITOR   cetirizine 10 MG tablet Commonly known as: ZYRTEC   Cholecalciferol 25 MCG (1000 UT) capsule   collagenase ointment Commonly known as: Santyl   famotidine 20 MG tablet Commonly known as: PEPCID   feeding supplement (ENSURE COMPLETE) Liqd   ondansetron 4 MG tablet Commonly known as: ZOFRAN   Oxycodone HCl 10 MG Tabs   Vitamin D (Ergocalciferol) 1.25 MG (50000 UNIT) Caps capsule Commonly known as: DRISDOL   zinc gluconate 50 MG tablet     TAKE these medications   acetaminophen 650 MG suppository Commonly known as: TYLENOL Place 1 suppository (650 mg total) rectally every 6 (six) hours as needed for  mild pain (or Fever >/= 101). Replaces: acetaminophen 500 MG tablet   Diapers & Supplies Misc Please provide patient with adult diapers and pads   LORazepam 2 MG/ML concentrated solution Commonly known as: ATIVAN Place 0.5 mLs (1 mg total) under the tongue every 4 (four) hours as needed for anxiety.   morphine 20 MG/5ML solution Take 1.3 mLs (5.2 mg total) by mouth every 4 (four) hours as needed for pain.   ondansetron 4 MG disintegrating tablet Commonly known as: ZOFRAN-ODT Take 1 tablet (4 mg total) by mouth every 6 (six) hours as needed for nausea.            Discharge Care Instructions  (From admission, onward)         Start     Ordered   03/14/20 0000  Discharge wound care:       Comments: Per hospice care   03/14/20 1138          Disposition and follow-up:   Michelle Holmes was discharged from [facility] in [place] condition.  At the hospital follow up visit please address:  1.    Sepsis Small bowel obstruction -Patient will go home with home hospice. [facility] admitted. -Medications will be prescribed through [facility]  2.  Labs / imaging needed at time of follow-up: N/A  3.  Pending labs/ test needing follow-up: N/A  Follow-up Appointments:   HPI Patient is seen at bedside.  She is awake and appears comfortable.  Patient is unable to verbalize or follow commands.  Hospital Course  by problem list: 1.  Sepsis secondary to UTI Patient presented to the ED for change in mental status.   Brain CT and MRI were negative for any acute abnormalities.  Code sepsis was initiated on admission with elevated lactic acid, fever, leukocytosis, tachycardia and tachypnea.   UA showed many bacteria and pyuria.  Urine culture grew 100 K colonies of E. Coli and Providencia Stuartii.  Vancomycin, cefepime and Flagyl were initially started in the ED and got narrowed down to IV cefepime.  She also received fluids per sepsis protocol to maintain blood  pressure.  Her mental status improved slightly but is nowhere near baseline.  Family proceed with comfort care.  Patient will go home with home hospice with [facility].  2.  Small bowel obstruction Patient also to noted to have a small bowel obstruction on CT abdomen pelvis on presentation.  SBO protocol initiated with NG tube suction.  Patient initially had good biliary output from NG tube.  She also had a few bowel movements with enema.  Repeat KUB during admission showed persistent obstruction and small bowel dilation.  NG tube was removed once no more output.  However patient had another episode of large emesis later that day so NG tube was replaced.  Patient was n.p.o. throughout this admission.  Patient will go home with home with NG tube for decompression.   Discharge Exam:   BP 134/84 (BP Location: Right Wrist)   Pulse 87   Temp 99.6 F (37.6 C) (Oral)   Resp (!) 21   Ht 5\' 2"  (1.575 m)   Wt 60 kg   SpO2 98%   BMI 24.19 kg/m  Discharge exam:   Physical Exam Constitutional:      General: She is not in acute distress. HENT:     Head: Normocephalic.  Eyes:     General:        Right eye: No discharge.        Left eye: No discharge.  Cardiovascular:     Rate and Rhythm: Normal rate and regular rhythm.  Pulmonary:     Effort: Pulmonary effort is normal. No respiratory distress.  Abdominal:     General: Bowel sounds are normal.  Musculoskeletal:     Right lower leg: Edema (trace) present.     Left lower leg: Edema (trace) present.     Comments: Left arm edematous.  Skin:    General: Skin is warm.  Neurological:     Mental Status: She is alert.     Pertinent Labs, Studies, and Procedures:  CBC Latest Ref Rng & Units 03/13/2020 03/12/2020 03/11/2020  WBC 4.0 - 10.5 K/uL 8.0 10.5 12.0(H)  Hemoglobin 12.0 - 15.0 g/dL 8.0(L) 7.8(L) 7.6(L)  Hematocrit 36.0 - 46.0 % 24.7(L) 23.9(L) 23.7(L)  Platelets 150 - 400 K/uL 202 180 167   CMP Latest Ref Rng & Units 03/13/2020  03/12/2020 03/11/2020  Glucose 70 - 99 mg/dL 145(H) 173(H) 128(H)  BUN 8 - 23 mg/dL 82(H) 80(H) 84(H)  Creatinine 0.44 - 1.00 mg/dL 2.27(H) 2.39(H) 2.49(H)  Sodium 135 - 145 mmol/L 144 143 141  Potassium 3.5 - 5.1 mmol/L 3.2(L) 3.0(L) 3.5  Chloride 98 - 111 mmol/L 116(H) 115(H) 112(H)  CO2 22 - 32 mmol/L 17(L) 16(L) 15(L)  Calcium 8.9 - 10.3 mg/dL 8.7(L) 8.6(L) 8.7(L)  Total Protein 6.5 - 8.1 g/dL 5.9(L) 5.7(L) -  Total Bilirubin 0.3 - 1.2 mg/dL 1.1 0.9 -  Alkaline Phos 38 - 126 U/L 53 60 -  AST 15 -  41 U/L 18 16 -  ALT 0 - 44 U/L 12 11 -   CT ABDOMEN PELVIS WO CONTRAST  Result Date: 03/08/2020 IMPRESSION: Distal high-grade small bowel obstruction with single point of transition within the periumbilical region. The proximal small bowel and stomach are dilated and fluid-filled. Extensive multi-vessel coronary artery calcification. Moderate anasarca with mild ascites and diffuse body wall subcutaneous edema. 18 mm right adrenal adenoma Aortic Atherosclerosis (ICD10-I70.0). Electronically Signed   By: Fidela Salisbury MD   On: 03/08/2020 13:53   CT Head Wo Contrast  Result Date: 03/08/2020 IMPRESSION: 1. No evidence of acute intracranial abnormality. MRI could provide more sensitive evaluation for acute infarct if clinically indicated. 2. Remote left frontal and left basal ganglia infarcts. 3. Similar versus slightly progressed age-advanced chronic microvascular ischemic disease and cerebral atrophy. Electronically Signed   By: Margaretha Sheffield MD   On: 03/08/2020 12:16   MR BRAIN WO CONTRAST  Result Date: 03/08/2020 IMPRESSION: 1. No evidence of acute intracranial abnormality on this limited exam. 2. Remote left frontal, left basal ganglia, and left occipital infarcts. The left occipital lobe infarct appears new since 2018. 3. Age advanced chronic microvascular ischemic disease. 4. Additional chronic findings as detailed above. Electronically Signed   By: Margaretha Sheffield MD   On: 03/08/2020  17:44   DG Chest Port 1 View  Result Date: 03/08/2020 IMPRESSION: No definite evidence of acute cardiopulmonary disease. Electronically Signed   By: Corrie Mckusick D.O.   On: 03/08/2020 11:05   DG Abd Portable 1V  Result Date: 03/09/2020 IMPRESSION: 1. Stable enteric tube, tip in the proximal duodenum. 2. Persistent small bowel obstruction pattern, not changed from the CT scout view yesterday. Electronically Signed   By: Genevie Ann M.D.   On: 03/09/2020 05:59   DG Abd Portable 1V-Small Bowel Protocol-Position Verification  Result Date: 03/08/2020 IMPRESSION: Well-positioned nasal/orogastric tube Electronically Signed   By: Lajean Manes M.D.   On: 03/08/2020 16:21   Discharge Instructions: Discharge Instructions    Call MD for:  persistant nausea and vomiting   Complete by: As directed    Call MD for:  severe uncontrolled pain   Complete by: As directed    Discharge instructions   Complete by: As directed    Patient will go home with home hospice [facility].   Discharge wound care:   Complete by: As directed    Per hospice care   Increase activity slowly   Complete by: As directed       Signed: Gaylan Gerold, DO 03/14/2020, 12:30 PM   Pager: [phone]

## 2020-03-14 NOTE — Progress Notes (Signed)
[facility])  Chart and pt information have been reviewed by [facility] physician.  Hospice eligibility confirmed at this time.  Spoke with daughter [name] this morning to answer any questions and to review briefly use of suction at home.  Denise verbalized understanding and anticipates more instruction with [facility] RN at this afternoon's admission visit. Per discussion the plan is to discharge home today by PTAR.  [facility] send signed and completed DNR home with pt/family.  Please provide prescriptions at discharge as needed to ensure ongoing symptom management.    DME delivered 03/13/20.   Above information shared with [facility] Hipp Manager.  Please call with any questions or concerns.  Thank you for the opportunity to participate in this pt's care.  Domenic Moras, BSN, RN [employer] [phone] (24h on call)

## 2020-03-14 NOTE — Plan of Care (Signed)
  Problem: Clinical Measurements: Goal: Will remain free from infection Outcome: Progressing   Problem: Clinical Measurements: Goal: Cardiovascular complication will be avoided Outcome: Progressing   Problem: Clinical Measurements: Goal: Respiratory complications will improve Outcome: Progressing   Problem: Coping: Goal: Level of anxiety will decrease Outcome: Progressing   Problem: Elimination: Goal: Will not experience complications related to bowel motility Outcome: Progressing   Problem: Elimination: Goal: Will not experience complications related to urinary retention Outcome: Progressing   Problem: Pain Managment: Goal: General experience of comfort will improve Outcome: Progressing   Problem: Safety: Goal: Ability to remain free from injury will improve Outcome: Progressing   Problem: Skin Integrity: Goal: Risk for impaired skin integrity will decrease Outcome: Progressing   Problem: Clinical Measurements: Goal: Respiratory complications will improve Outcome: Progressing

## 2020-03-24 DIAGNOSIS — J449 Chronic obstructive pulmonary disease, unspecified: Secondary | ICD-10-CM | POA: Diagnosis not present

## 2020-03-26 DEATH — deceased

## 2020-08-18 ENCOUNTER — Encounter: Payer: Self-pay | Admitting: *Deleted

## 2020-08-18 NOTE — Progress Notes (Signed)

## 2020-08-19 NOTE — Progress Notes (Signed)
Things That May Be Affecting Your Health:  Alcohol  Hearing loss  Pain   x Depression  Home Safety  Sexual Health   Diabetes x Lack of physical activity  Stress  x Difficulty with daily activities  Loneliness  Tiredness   Drug use  Medicines  Tobacco use   Falls  Motor Vehicle Safety  Weight   Food choices  Oral Health  Other    YOUR PERSONALIZED HEALTH PLAN : 1. Schedule your next subsequent Medicare Wellness visit in one year 2. Attend all of your regular appointments to address your medical issues 3. Complete the preventative screenings and services   Annual Wellness Visit   Medicare Covered Preventative Screenings and [place] Men and Women Who How Often Need? Date of Last Service Action  Abdominal Aortic Aneurysm Adults with AAA risk factors Once      Alcohol Misuse and Counseling All Adults Screening once a year if no alcohol misuse. Counseling up to 4 face to face sessions.     Bone Density Measurement  Adults at risk for osteoporosis Once every 2 yrs      Lipid Panel Z13.6 All adults without CV disease Once every 5 yrs       Colorectal Cancer  Stool sample or Colonoscopy All adults 64 and older  Once every year Every 10 years        Depression All Adults Once a year  Today   Diabetes Screening Blood glucose, post glucose load, or GTT Z13.1 All adults at risk Pre-diabetics Once per year Twice per year      Diabetes  Self-Management Training All adults Diabetics 10 hrs first year; 2 hours subsequent years. Requires Copay     Glaucoma Diabetics Family history of glaucoma African Americans 74 yrs + Hispanic Americans 75 yrs + Annually - requires coppay      Hepatitis C Z72.89 or F19.20 High Risk for HCV Born between 1945 and 1965 Annually Once X     HIV Z11.4 All adults based on risk Annually btw ages 17 & 44 regardless of risk Annually > 65 yrs if at increased risk      Lung Cancer Screening Asymptomatic adults aged 87-77 with 30  pack yr history and current smoker OR quit within the last 15 yrs Annually Must have counseling and shared decision making documentation before first screen      Medical Nutrition Therapy Adults with  Diabetes Renal disease Kidney transplant within past 3 yrs 3 hours first year; 2 hours subsequent years     Obesity and Counseling All adults Screening once a year Counseling if BMI 30 or higher  Today   Tobacco Use Counseling Adults who use tobacco  Up to 8 visits in one year     Vaccines Z23 Hepatitis B Influenza  Pneumonia  Adults  Once Once every flu season Two different vaccines separated by one year X Tetatnus/Shingles    Next Annual Wellness Visit People with Medicare Every year  Today     Services & Screenings Women Who How Often Need  Date of Last Service Action  Mammogram  Z12.31 Women over 13 One baseline ages 55-39. Annually ager 23 yrs+ X     Pap tests All women Annually if high risk. Every 2 yrs for normal risk women      Screening for cervical cancer with  Pap (Z01.419 nl or Z01.411abnl) & HPV Z11.51 Women aged 66 to 59 Once every 5 yrs  Screening pelvic and breast exams All women Annually if high risk. Every 2 yrs for normal risk women     Sexually Transmitted Diseases Chlamydia Gonorrhea Syphilis All at risk adults Annually for non pregnant females at increased risk         [place] Men Who How Ofter Need  Date of Last Service Action  Prostate Cancer - DRE & PSA Men over 50 Annually.  DRE might require a copay.        Sexually Transmitted Diseases Syphilis All at risk adults Annually for men at increased risk      Health Maintenance List Health Maintenance  Topic Date Due   COVID-19 Vaccine (1) Never done   OPHTHALMOLOGY EXAM  Never done   URINE MICROALBUMIN  Never done   Hepatitis C Screening  Never done   TETANUS/TDAP  Never done   Zoster Vaccines- Shingrix (1 of 2) Never done   COLONOSCOPY (Pts 45-14yrs Insurance coverage  will need to be confirmed)  Never done   MAMMOGRAM  03/29/2008   DEXA SCAN  Never done   HEMOGLOBIN A1C  08/29/2018   FOOT EXAM  03/01/2019   INFLUENZA VACCINE  08/26/2020   PNA vac Low Risk Adult  Completed   HPV VACCINES  Aged Out
